# Patient Record
Sex: Male | Born: 1945 | Race: White | Hispanic: No | Smoking: Current some day smoker
Health system: Southern US, Community
[De-identification: ages and names within clinical notes are randomized; demographics above are authoritative.]

## PROBLEM LIST (undated history)

## (undated) DIAGNOSIS — I059 Rheumatic mitral valve disease, unspecified: Secondary | ICD-10-CM

## (undated) DIAGNOSIS — J449 Chronic obstructive pulmonary disease, unspecified: Secondary | ICD-10-CM

## (undated) DIAGNOSIS — F329 Major depressive disorder, single episode, unspecified: Secondary | ICD-10-CM

## (undated) DIAGNOSIS — L409 Psoriasis, unspecified: Secondary | ICD-10-CM

## (undated) DIAGNOSIS — K439 Ventral hernia without obstruction or gangrene: Secondary | ICD-10-CM

## (undated) DIAGNOSIS — Z95810 Presence of automatic (implantable) cardiac defibrillator: Secondary | ICD-10-CM

## (undated) DIAGNOSIS — I442 Atrioventricular block, complete: Secondary | ICD-10-CM

## (undated) DIAGNOSIS — I5022 Chronic systolic (congestive) heart failure: Secondary | ICD-10-CM

## (undated) DIAGNOSIS — I255 Ischemic cardiomyopathy: Secondary | ICD-10-CM

## (undated) DIAGNOSIS — L219 Seborrheic dermatitis, unspecified: Secondary | ICD-10-CM

## (undated) DIAGNOSIS — F0390 Unspecified dementia without behavioral disturbance: Secondary | ICD-10-CM

## (undated) DIAGNOSIS — Z9981 Dependence on supplemental oxygen: Secondary | ICD-10-CM

## (undated) DIAGNOSIS — F32A Depression, unspecified: Secondary | ICD-10-CM

## (undated) DIAGNOSIS — I82409 Acute embolism and thrombosis of unspecified deep veins of unspecified lower extremity: Secondary | ICD-10-CM

## (undated) DIAGNOSIS — I10 Essential (primary) hypertension: Secondary | ICD-10-CM

## (undated) DIAGNOSIS — I509 Heart failure, unspecified: Secondary | ICD-10-CM

## (undated) DIAGNOSIS — Z9889 Other specified postprocedural states: Secondary | ICD-10-CM

## (undated) DIAGNOSIS — I482 Chronic atrial fibrillation, unspecified: Secondary | ICD-10-CM

## (undated) DIAGNOSIS — F1010 Alcohol abuse, uncomplicated: Secondary | ICD-10-CM

## (undated) DIAGNOSIS — K760 Fatty (change of) liver, not elsewhere classified: Secondary | ICD-10-CM

## (undated) DIAGNOSIS — J9610 Chronic respiratory failure, unspecified whether with hypoxia or hypercapnia: Secondary | ICD-10-CM

## (undated) DIAGNOSIS — N183 Chronic kidney disease, stage 3 (moderate): Secondary | ICD-10-CM

## (undated) DIAGNOSIS — F419 Anxiety disorder, unspecified: Secondary | ICD-10-CM

## (undated) DIAGNOSIS — I639 Cerebral infarction, unspecified: Secondary | ICD-10-CM

## (undated) DIAGNOSIS — F319 Bipolar disorder, unspecified: Secondary | ICD-10-CM

## (undated) DIAGNOSIS — E119 Type 2 diabetes mellitus without complications: Secondary | ICD-10-CM

## (undated) DIAGNOSIS — I2510 Atherosclerotic heart disease of native coronary artery without angina pectoris: Secondary | ICD-10-CM

## (undated) DIAGNOSIS — E7800 Pure hypercholesterolemia, unspecified: Secondary | ICD-10-CM

## (undated) DIAGNOSIS — F17200 Nicotine dependence, unspecified, uncomplicated: Secondary | ICD-10-CM

## (undated) HISTORY — DX: Cerebral infarction, unspecified: I63.9

## (undated) HISTORY — DX: Essential (primary) hypertension: I10

## (undated) HISTORY — DX: Alcohol abuse, uncomplicated: F10.10

## (undated) HISTORY — PX: INSERT / REPLACE / REMOVE PACEMAKER: SUR710

## (undated) HISTORY — DX: Rheumatic mitral valve disease, unspecified: I05.9

## (undated) HISTORY — DX: Atrioventricular block, complete: I44.2

## (undated) HISTORY — DX: Chronic systolic (congestive) heart failure: I50.22

## (undated) HISTORY — DX: Atherosclerotic heart disease of native coronary artery without angina pectoris: I25.10

## (undated) HISTORY — DX: Pure hypercholesterolemia, unspecified: E78.00

## (undated) HISTORY — DX: Other specified postprocedural states: Z98.890

## (undated) HISTORY — DX: Ischemic cardiomyopathy: I25.5

## (undated) HISTORY — DX: Presence of automatic (implantable) cardiac defibrillator: Z95.810

## (undated) HISTORY — DX: Seborrheic dermatitis, unspecified: L21.9

## (undated) HISTORY — DX: Heart failure, unspecified: I50.9

## (undated) HISTORY — DX: Chronic kidney disease, stage 3 (moderate): N18.3

## (undated) HISTORY — DX: Depression, unspecified: F32.A

## (undated) HISTORY — DX: Fatty (change of) liver, not elsewhere classified: K76.0

## (undated) HISTORY — DX: Nicotine dependence, unspecified, uncomplicated: F17.200

## (undated) HISTORY — PX: MITRAL VALVE REPLACEMENT: SHX147

## (undated) HISTORY — DX: Bipolar disorder, unspecified: F31.9

## (undated) HISTORY — DX: Chronic obstructive pulmonary disease, unspecified: J44.9

## (undated) HISTORY — DX: Anxiety disorder, unspecified: F41.9

## (undated) HISTORY — DX: Ventral hernia without obstruction or gangrene: K43.9

## (undated) HISTORY — PX: HERNIA REPAIR: SHX51

## (undated) HISTORY — DX: Type 2 diabetes mellitus without complications: E11.9

## (undated) HISTORY — PX: EXPLORATORY LAPAROTOMY: SUR591

## (undated) HISTORY — DX: Major depressive disorder, single episode, unspecified: F32.9

## (undated) HISTORY — PX: VASECTOMY: SHX75

## (undated) HISTORY — PX: OTHER SURGICAL HISTORY: SHX169

---

## 1997-09-07 ENCOUNTER — Encounter (HOSPITAL_COMMUNITY): Admission: RE | Admit: 1997-09-07 | Discharge: 1997-12-06 | Payer: Self-pay | Admitting: Psychiatry

## 1997-09-11 ENCOUNTER — Encounter: Admission: RE | Admit: 1997-09-11 | Discharge: 1997-09-11 | Payer: Self-pay | Admitting: Sports Medicine

## 1997-09-17 ENCOUNTER — Encounter: Admission: RE | Admit: 1997-09-17 | Discharge: 1997-09-17 | Payer: Self-pay | Admitting: Family Medicine

## 1997-10-01 ENCOUNTER — Ambulatory Visit (HOSPITAL_COMMUNITY): Admission: RE | Admit: 1997-10-01 | Discharge: 1997-10-01 | Payer: Self-pay | Admitting: General Surgery

## 1997-10-02 ENCOUNTER — Encounter: Admission: RE | Admit: 1997-10-02 | Discharge: 1997-10-02 | Payer: Self-pay | Admitting: Family Medicine

## 1997-10-11 ENCOUNTER — Inpatient Hospital Stay (HOSPITAL_COMMUNITY): Admission: RE | Admit: 1997-10-11 | Discharge: 1997-10-16 | Payer: Self-pay | Admitting: General Surgery

## 1997-11-06 ENCOUNTER — Encounter: Admission: RE | Admit: 1997-11-06 | Discharge: 1997-11-06 | Payer: Self-pay | Admitting: Family Medicine

## 1997-11-13 ENCOUNTER — Encounter: Admission: RE | Admit: 1997-11-13 | Discharge: 1997-11-13 | Payer: Self-pay | Admitting: Family Medicine

## 1997-11-20 ENCOUNTER — Encounter: Admission: RE | Admit: 1997-11-20 | Discharge: 1997-11-20 | Payer: Self-pay | Admitting: Sports Medicine

## 1997-11-27 ENCOUNTER — Encounter: Admission: RE | Admit: 1997-11-27 | Discharge: 1997-11-27 | Payer: Self-pay | Admitting: Family Medicine

## 1998-06-17 ENCOUNTER — Encounter: Admission: RE | Admit: 1998-06-17 | Discharge: 1998-06-17 | Payer: Self-pay | Admitting: Family Medicine

## 1998-06-27 ENCOUNTER — Encounter: Admission: RE | Admit: 1998-06-27 | Discharge: 1998-06-27 | Payer: Self-pay | Admitting: Family Medicine

## 1998-07-04 ENCOUNTER — Encounter: Admission: RE | Admit: 1998-07-04 | Discharge: 1998-07-04 | Payer: Self-pay | Admitting: Family Medicine

## 1998-10-03 ENCOUNTER — Inpatient Hospital Stay (HOSPITAL_COMMUNITY): Admission: EM | Admit: 1998-10-03 | Discharge: 1998-10-04 | Payer: Self-pay | Admitting: Emergency Medicine

## 1998-10-03 ENCOUNTER — Encounter: Payer: Self-pay | Admitting: Cardiothoracic Surgery

## 1998-10-03 ENCOUNTER — Encounter: Payer: Self-pay | Admitting: Emergency Medicine

## 1998-10-04 ENCOUNTER — Encounter: Payer: Self-pay | Admitting: Cardiothoracic Surgery

## 1998-12-02 ENCOUNTER — Emergency Department (HOSPITAL_COMMUNITY): Admission: EM | Admit: 1998-12-02 | Discharge: 1998-12-02 | Payer: Self-pay | Admitting: Emergency Medicine

## 1999-02-02 ENCOUNTER — Inpatient Hospital Stay (HOSPITAL_COMMUNITY): Admission: EM | Admit: 1999-02-02 | Discharge: 1999-02-05 | Payer: Self-pay | Admitting: Emergency Medicine

## 1999-02-02 ENCOUNTER — Encounter: Payer: Self-pay | Admitting: Emergency Medicine

## 1999-09-23 ENCOUNTER — Ambulatory Visit (HOSPITAL_COMMUNITY): Admission: RE | Admit: 1999-09-23 | Discharge: 1999-09-23 | Payer: Self-pay | Admitting: Neurology

## 1999-09-23 ENCOUNTER — Encounter: Payer: Self-pay | Admitting: Neurology

## 2000-06-05 ENCOUNTER — Inpatient Hospital Stay (HOSPITAL_COMMUNITY): Admission: EM | Admit: 2000-06-05 | Discharge: 2000-06-10 | Payer: Self-pay | Admitting: Emergency Medicine

## 2000-06-05 ENCOUNTER — Encounter: Payer: Self-pay | Admitting: Cardiology

## 2000-08-17 ENCOUNTER — Emergency Department (HOSPITAL_COMMUNITY): Admission: EM | Admit: 2000-08-17 | Discharge: 2000-08-17 | Payer: Self-pay | Admitting: Emergency Medicine

## 2000-08-17 ENCOUNTER — Encounter: Payer: Self-pay | Admitting: Emergency Medicine

## 2000-08-18 ENCOUNTER — Encounter: Payer: Self-pay | Admitting: Emergency Medicine

## 2000-08-19 ENCOUNTER — Ambulatory Visit (HOSPITAL_COMMUNITY): Admission: RE | Admit: 2000-08-19 | Discharge: 2000-08-19 | Payer: Self-pay | Admitting: Emergency Medicine

## 2000-10-05 ENCOUNTER — Emergency Department (HOSPITAL_COMMUNITY): Admission: EM | Admit: 2000-10-05 | Discharge: 2000-10-05 | Payer: Self-pay | Admitting: *Deleted

## 2000-10-05 ENCOUNTER — Encounter: Payer: Self-pay | Admitting: Emergency Medicine

## 2000-10-18 ENCOUNTER — Emergency Department (HOSPITAL_COMMUNITY): Admission: EM | Admit: 2000-10-18 | Discharge: 2000-10-18 | Payer: Self-pay | Admitting: Emergency Medicine

## 2000-11-02 ENCOUNTER — Encounter: Payer: Self-pay | Admitting: Gastroenterology

## 2000-11-02 ENCOUNTER — Ambulatory Visit (HOSPITAL_COMMUNITY): Admission: RE | Admit: 2000-11-02 | Discharge: 2000-11-02 | Payer: Self-pay | Admitting: Gastroenterology

## 2000-11-05 ENCOUNTER — Ambulatory Visit (HOSPITAL_COMMUNITY): Admission: RE | Admit: 2000-11-05 | Discharge: 2000-11-05 | Payer: Self-pay | Admitting: Gastroenterology

## 2000-11-05 ENCOUNTER — Encounter: Payer: Self-pay | Admitting: Gastroenterology

## 2001-01-15 ENCOUNTER — Encounter: Payer: Self-pay | Admitting: Emergency Medicine

## 2001-01-15 ENCOUNTER — Inpatient Hospital Stay (HOSPITAL_COMMUNITY): Admission: EM | Admit: 2001-01-15 | Discharge: 2001-01-17 | Payer: Self-pay | Admitting: Emergency Medicine

## 2001-01-16 ENCOUNTER — Encounter: Payer: Self-pay | Admitting: Cardiology

## 2001-03-01 ENCOUNTER — Emergency Department (HOSPITAL_COMMUNITY): Admission: EM | Admit: 2001-03-01 | Discharge: 2001-03-01 | Payer: Self-pay

## 2001-03-12 ENCOUNTER — Inpatient Hospital Stay (HOSPITAL_COMMUNITY): Admission: EM | Admit: 2001-03-12 | Discharge: 2001-03-19 | Payer: Self-pay

## 2001-06-22 ENCOUNTER — Encounter (INDEPENDENT_AMBULATORY_CARE_PROVIDER_SITE_OTHER): Payer: Self-pay | Admitting: Specialist

## 2001-06-22 ENCOUNTER — Encounter (INDEPENDENT_AMBULATORY_CARE_PROVIDER_SITE_OTHER): Payer: Self-pay | Admitting: *Deleted

## 2001-06-22 ENCOUNTER — Emergency Department (HOSPITAL_COMMUNITY): Admission: EM | Admit: 2001-06-22 | Discharge: 2001-06-22 | Payer: Self-pay | Admitting: Emergency Medicine

## 2001-06-22 ENCOUNTER — Encounter: Payer: Self-pay | Admitting: Emergency Medicine

## 2001-06-22 ENCOUNTER — Inpatient Hospital Stay (HOSPITAL_COMMUNITY): Admission: EM | Admit: 2001-06-22 | Discharge: 2001-07-03 | Payer: Self-pay | Admitting: Family Medicine

## 2001-06-22 ENCOUNTER — Encounter: Payer: Self-pay | Admitting: Family Medicine

## 2001-06-24 ENCOUNTER — Encounter: Payer: Self-pay | Admitting: Family Medicine

## 2001-06-25 ENCOUNTER — Encounter: Payer: Self-pay | Admitting: Family Medicine

## 2001-06-27 ENCOUNTER — Encounter: Payer: Self-pay | Admitting: Family Medicine

## 2001-06-28 ENCOUNTER — Encounter: Payer: Self-pay | Admitting: Family Medicine

## 2001-06-29 ENCOUNTER — Encounter: Payer: Self-pay | Admitting: Family Medicine

## 2001-07-01 ENCOUNTER — Encounter: Payer: Self-pay | Admitting: Family Medicine

## 2001-09-14 ENCOUNTER — Encounter: Payer: Self-pay | Admitting: Family Medicine

## 2001-09-14 ENCOUNTER — Ambulatory Visit (HOSPITAL_COMMUNITY): Admission: RE | Admit: 2001-09-14 | Discharge: 2001-09-14 | Payer: Self-pay | Admitting: Family Medicine

## 2001-09-28 ENCOUNTER — Inpatient Hospital Stay (HOSPITAL_COMMUNITY): Admission: EM | Admit: 2001-09-28 | Discharge: 2001-10-02 | Payer: Self-pay

## 2001-09-29 ENCOUNTER — Encounter: Payer: Self-pay | Admitting: Cardiology

## 2001-12-08 ENCOUNTER — Encounter: Payer: Self-pay | Admitting: Cardiology

## 2001-12-08 ENCOUNTER — Observation Stay (HOSPITAL_COMMUNITY): Admission: EM | Admit: 2001-12-08 | Discharge: 2001-12-09 | Payer: Self-pay

## 2002-03-30 ENCOUNTER — Encounter: Payer: Self-pay | Admitting: Cardiovascular Disease

## 2002-03-30 ENCOUNTER — Inpatient Hospital Stay (HOSPITAL_COMMUNITY): Admission: EM | Admit: 2002-03-30 | Discharge: 2002-04-04 | Payer: Self-pay | Admitting: Emergency Medicine

## 2002-05-28 ENCOUNTER — Encounter: Payer: Self-pay | Admitting: *Deleted

## 2002-05-28 ENCOUNTER — Emergency Department (HOSPITAL_COMMUNITY): Admission: EM | Admit: 2002-05-28 | Discharge: 2002-05-28 | Payer: Self-pay | Admitting: *Deleted

## 2002-07-04 ENCOUNTER — Inpatient Hospital Stay (HOSPITAL_COMMUNITY): Admission: EM | Admit: 2002-07-04 | Discharge: 2002-07-15 | Payer: Self-pay | Admitting: Emergency Medicine

## 2002-07-04 ENCOUNTER — Encounter: Payer: Self-pay | Admitting: Emergency Medicine

## 2002-07-06 ENCOUNTER — Encounter: Payer: Self-pay | Admitting: Vascular Surgery

## 2002-07-06 ENCOUNTER — Encounter (INDEPENDENT_AMBULATORY_CARE_PROVIDER_SITE_OTHER): Payer: Self-pay | Admitting: *Deleted

## 2002-07-07 ENCOUNTER — Encounter: Payer: Self-pay | Admitting: Cardiology

## 2002-07-08 ENCOUNTER — Encounter: Payer: Self-pay | Admitting: Cardiology

## 2002-07-10 ENCOUNTER — Encounter: Payer: Self-pay | Admitting: Cardiovascular Disease

## 2002-12-05 ENCOUNTER — Emergency Department (HOSPITAL_COMMUNITY): Admission: EM | Admit: 2002-12-05 | Discharge: 2002-12-05 | Payer: Self-pay | Admitting: Emergency Medicine

## 2002-12-05 ENCOUNTER — Encounter: Payer: Self-pay | Admitting: Emergency Medicine

## 2002-12-08 ENCOUNTER — Emergency Department (HOSPITAL_COMMUNITY): Admission: EM | Admit: 2002-12-08 | Discharge: 2002-12-08 | Payer: Self-pay

## 2002-12-08 ENCOUNTER — Encounter: Payer: Self-pay | Admitting: *Deleted

## 2003-01-23 ENCOUNTER — Inpatient Hospital Stay (HOSPITAL_COMMUNITY): Admission: AD | Admit: 2003-01-23 | Discharge: 2003-01-26 | Payer: Self-pay | Admitting: Internal Medicine

## 2003-04-01 ENCOUNTER — Inpatient Hospital Stay (HOSPITAL_COMMUNITY): Admission: AD | Admit: 2003-04-01 | Discharge: 2003-04-04 | Payer: Self-pay | Admitting: Psychiatry

## 2003-04-13 ENCOUNTER — Encounter: Admission: RE | Admit: 2003-04-13 | Discharge: 2003-04-13 | Payer: Self-pay | Admitting: Psychiatry

## 2003-06-29 ENCOUNTER — Ambulatory Visit (HOSPITAL_COMMUNITY): Admission: RE | Admit: 2003-06-29 | Discharge: 2003-06-29 | Payer: Self-pay | Admitting: Internal Medicine

## 2003-07-02 ENCOUNTER — Inpatient Hospital Stay (HOSPITAL_COMMUNITY): Admission: EM | Admit: 2003-07-02 | Discharge: 2003-07-05 | Payer: Self-pay | Admitting: Emergency Medicine

## 2003-07-17 ENCOUNTER — Emergency Department (HOSPITAL_COMMUNITY): Admission: EM | Admit: 2003-07-17 | Discharge: 2003-07-17 | Payer: Self-pay | Admitting: Emergency Medicine

## 2003-08-30 ENCOUNTER — Observation Stay (HOSPITAL_COMMUNITY): Admission: EM | Admit: 2003-08-30 | Discharge: 2003-08-31 | Payer: Self-pay | Admitting: Emergency Medicine

## 2003-09-15 IMAGING — CT CT HEAD W/O CM
1 series · 15 of 28 positions shown, 19 images · non-contrast
Comparison: 05/28/02.

FINDINGS
CLINICAL DATA: HEADACHE RADIATING TO THE RIGHT NECK AND SHOULDER.  NO KNOWN INJURY.
CT SCAN OF THE HEAD WITHOUT CONTRAST

[Series 2: brain · axial · 0.47mm/px · z∈[+113,+236]mm · 15 of 28 slices shown, 19 images]
[im 2/28  brain]
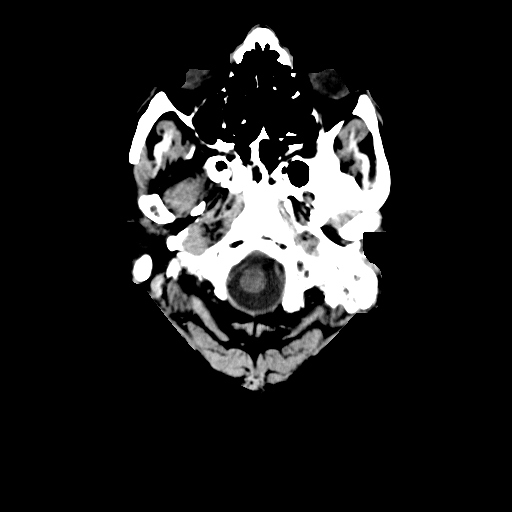
[im 2/28  bone]
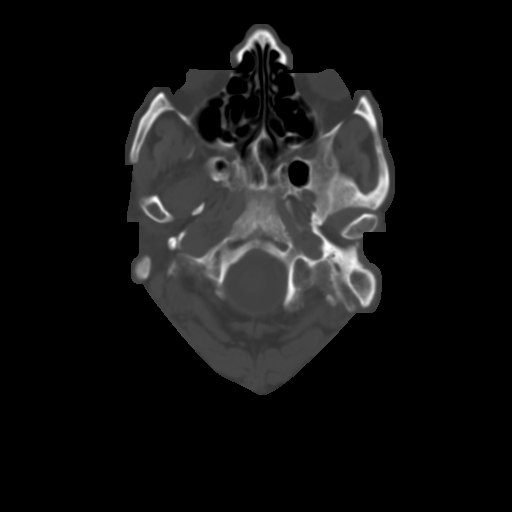
[im 4/28  brain]
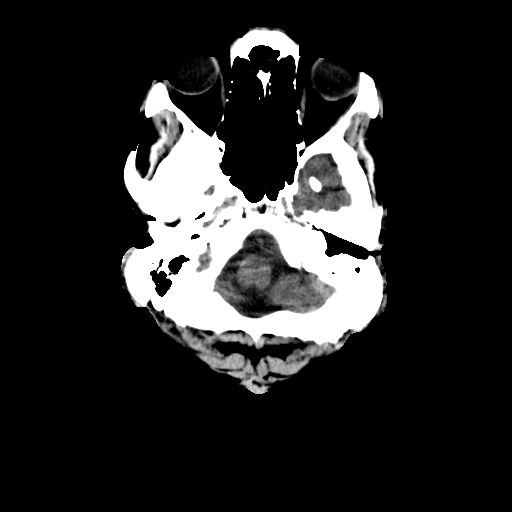
[im 6/28  brain]
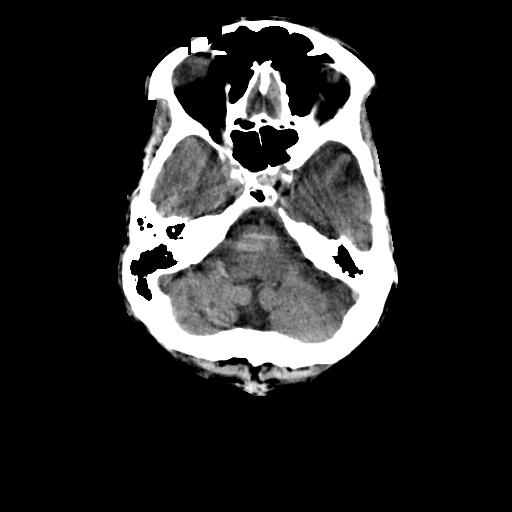
[im 8/28  brain]
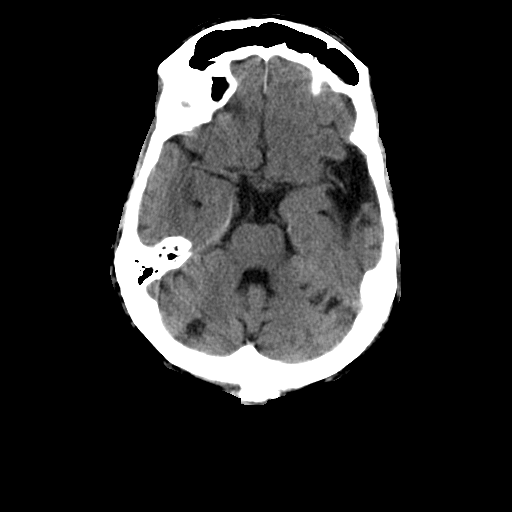
[im 9/28  brain]
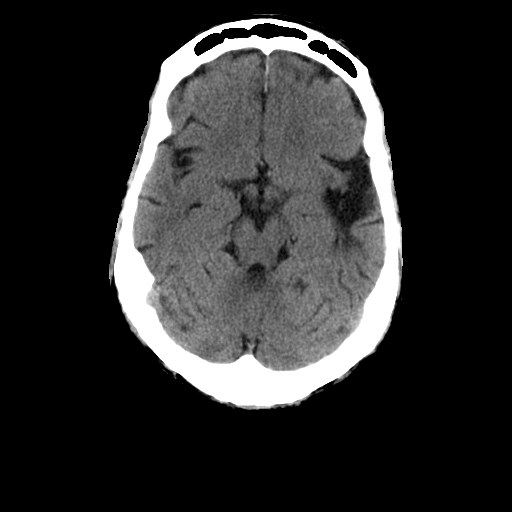
[im 9/28  bone]
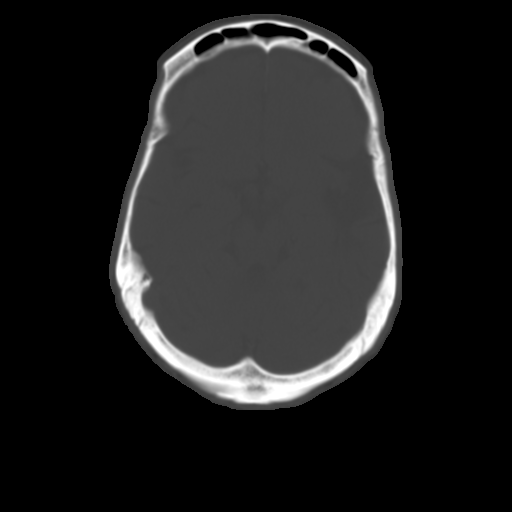
[im 11/28  brain]
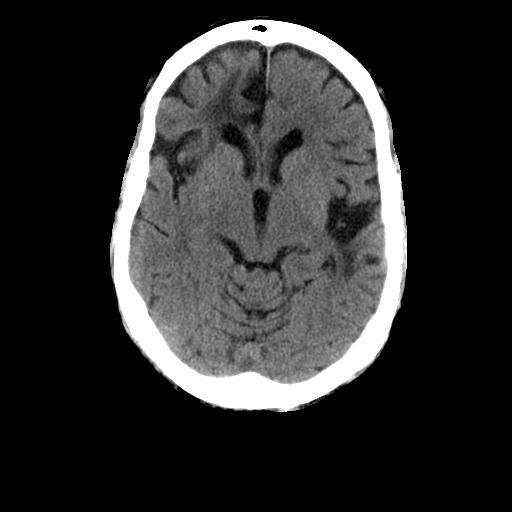
[im 13/28  brain]
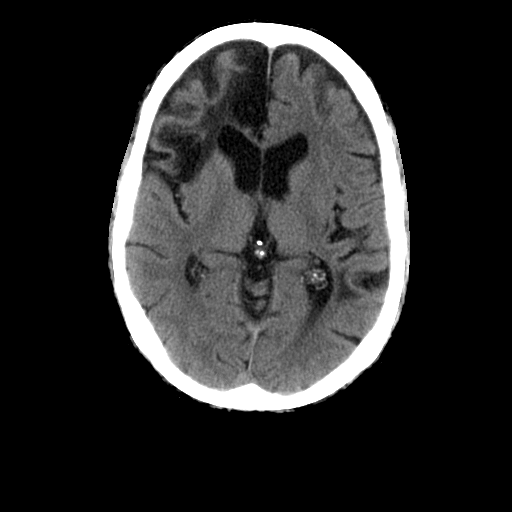
[im 15/28  brain]
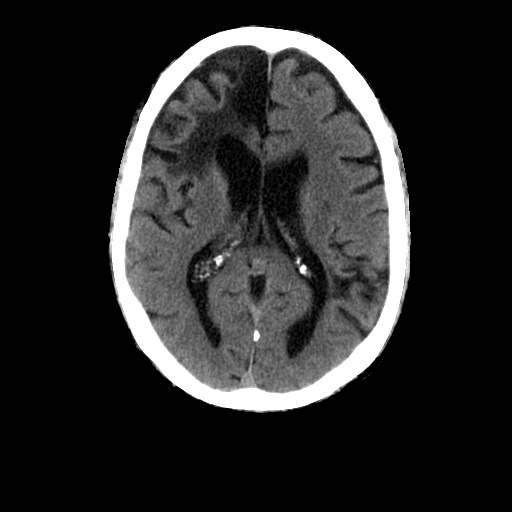
[im 16/28  brain]
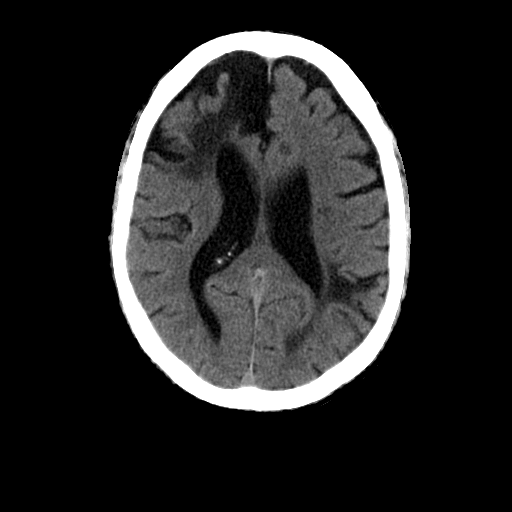
[im 16/28  bone]
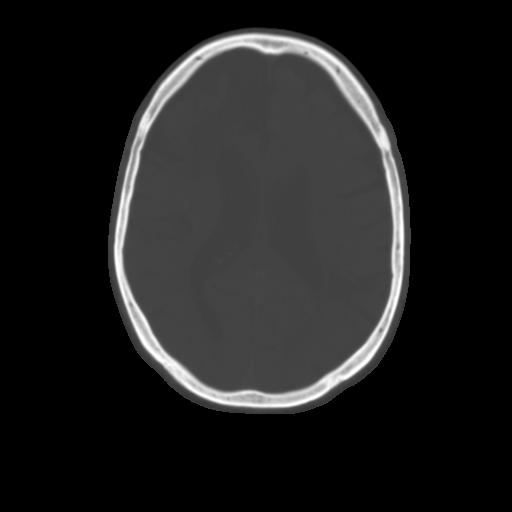
[im 18/28  brain]
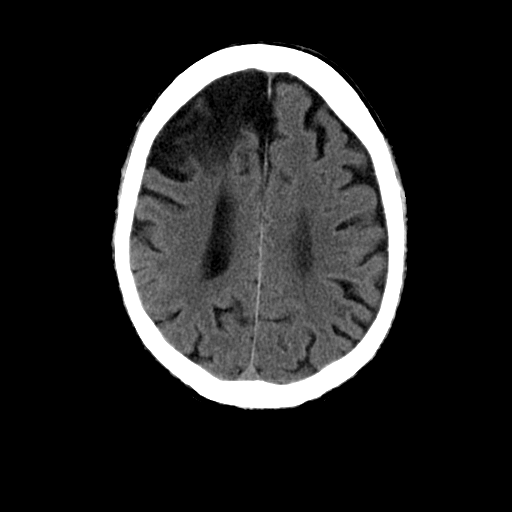
[im 20/28  brain]
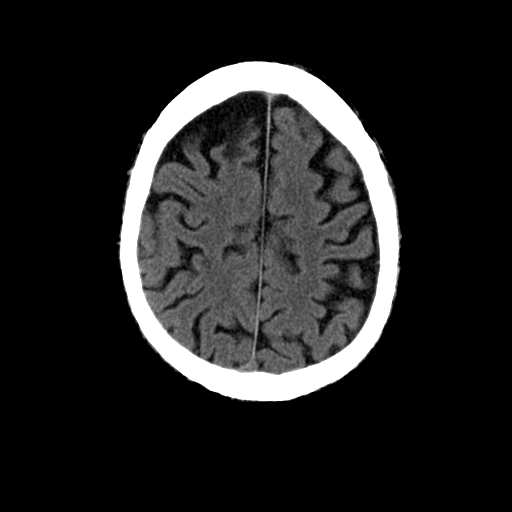
[im 21/28  brain]
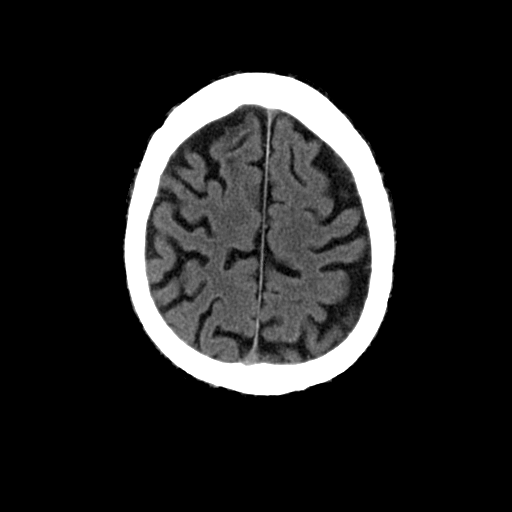
[im 23/28  brain]
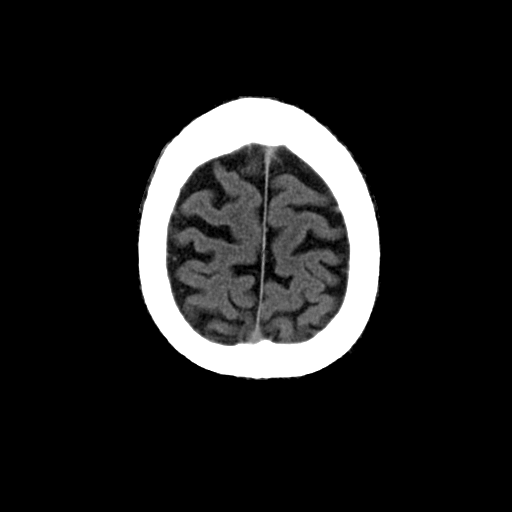
[im 23/28  bone]
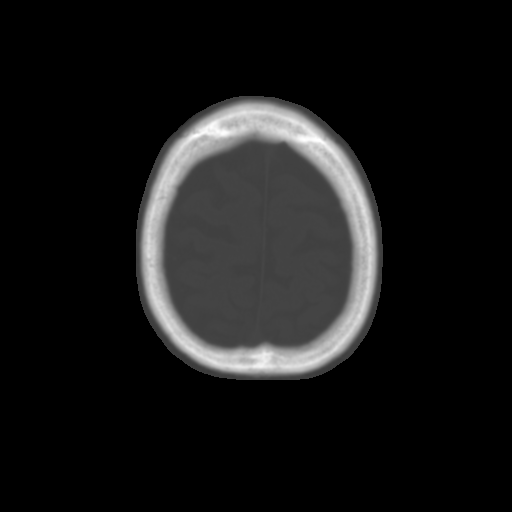
[im 25/28  brain]
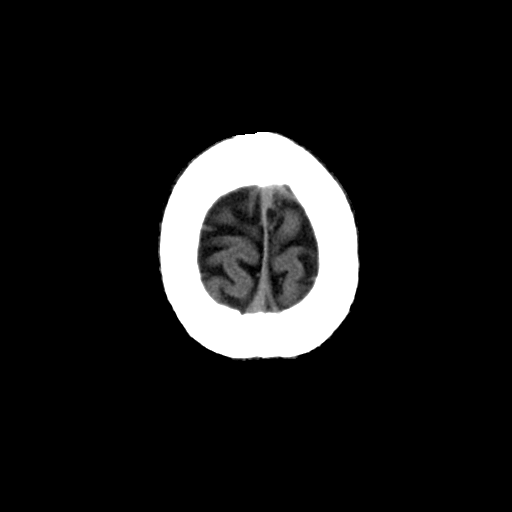
[im 27/28  brain]
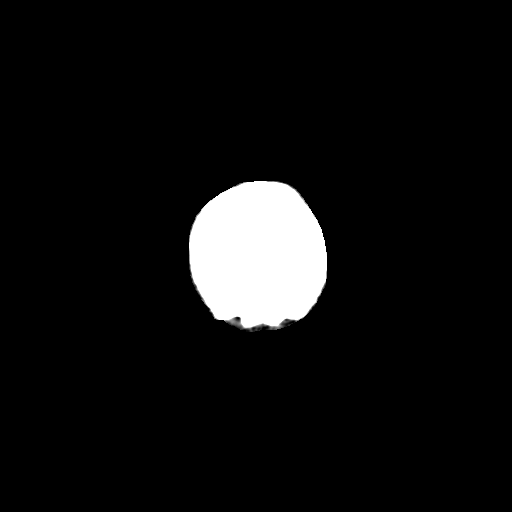

[15 of 28 positions shown; findings below may reference images not displayed]

ROUTINE UNENHANCED STUDY WAS PERFORMED.
THERE IS NO EVIDENCE OF ACUTE INTRACRANIAL HEMORRHAGE, MASS EFFECT, OR EXTRA-AXIAL FLUID
COLLECTION.  THERE IS EXTENSIVE CHRONIC ISCHEMIC CHANGE WITH OLD RIGHT FRONTAL, LEFT TEMPORAL, LEFT
PARIETAL, AND RIGHT CEREBELLAR STROKES WHICH APPEAR STABLE.  THERE IS NO CT EVIDENCE OF ACUTE
CORTICAL BASED STROKE OR HYDROCEPHALUS.  THERE IS SLIGHT ASYMMETRY OF THE NASOPHARYNGEAL SOFT
TISSUES WITH FULLNESS ON THE LEFT ON IMAGE ONE.  THIS AREA WAS NOT PREVIOUSLY IMAGED.  THE MASTOID
AIR CELLS ON THE LEFT ARE UNDER PNEUMATIZED AND HAVE A SIMILAR APPEARANCE TO PRIOR STUDIES.  THE
VISUALIZED PARANASAL SINUSES ARE CLEAR.
IMPRESSION
1.  STABLE MULTIFOCAL CHRONIC ISCHEMIC CHANGES AS DESCRIBED.  NO ACUTE INTRACRANIAL FINDINGS
DEMONSTRATED.
2.  ASYMMETRIC FULLNESS OF THE NASOPHARYNGEAL SOFT TISSUES ON THE LEFT.  CORRELATION WITH DIRECT
VISUALIZATION IS RECOMMENDED TO EXCLUDE THE POSSIBILITY OF NEOPLASM.

## 2003-10-15 ENCOUNTER — Encounter: Admission: RE | Admit: 2003-10-15 | Discharge: 2003-10-15 | Payer: Self-pay | Admitting: Internal Medicine

## 2003-12-08 ENCOUNTER — Emergency Department (HOSPITAL_COMMUNITY): Admission: EM | Admit: 2003-12-08 | Discharge: 2003-12-09 | Payer: Self-pay | Admitting: Emergency Medicine

## 2004-01-13 ENCOUNTER — Inpatient Hospital Stay (HOSPITAL_COMMUNITY): Admission: EM | Admit: 2004-01-13 | Discharge: 2004-01-18 | Payer: Self-pay

## 2004-04-18 ENCOUNTER — Encounter: Admission: RE | Admit: 2004-04-18 | Discharge: 2004-04-18 | Payer: Self-pay | Admitting: Cardiology

## 2004-05-22 ENCOUNTER — Ambulatory Visit (HOSPITAL_COMMUNITY): Admission: RE | Admit: 2004-05-22 | Discharge: 2004-05-22 | Payer: Self-pay | Admitting: Cardiology

## 2004-06-06 IMAGING — CR DG CHEST 1V PORT
1 series · 1 of 1 positions shown · non-contrast
Comparison: Portable exam 07/02/03.

CLINICAL DATA: Chest pain.  Shortness of breath. 
 PORTABLE CHEST ONE VIEW ([DATE] HOURS)

[view not recorded]
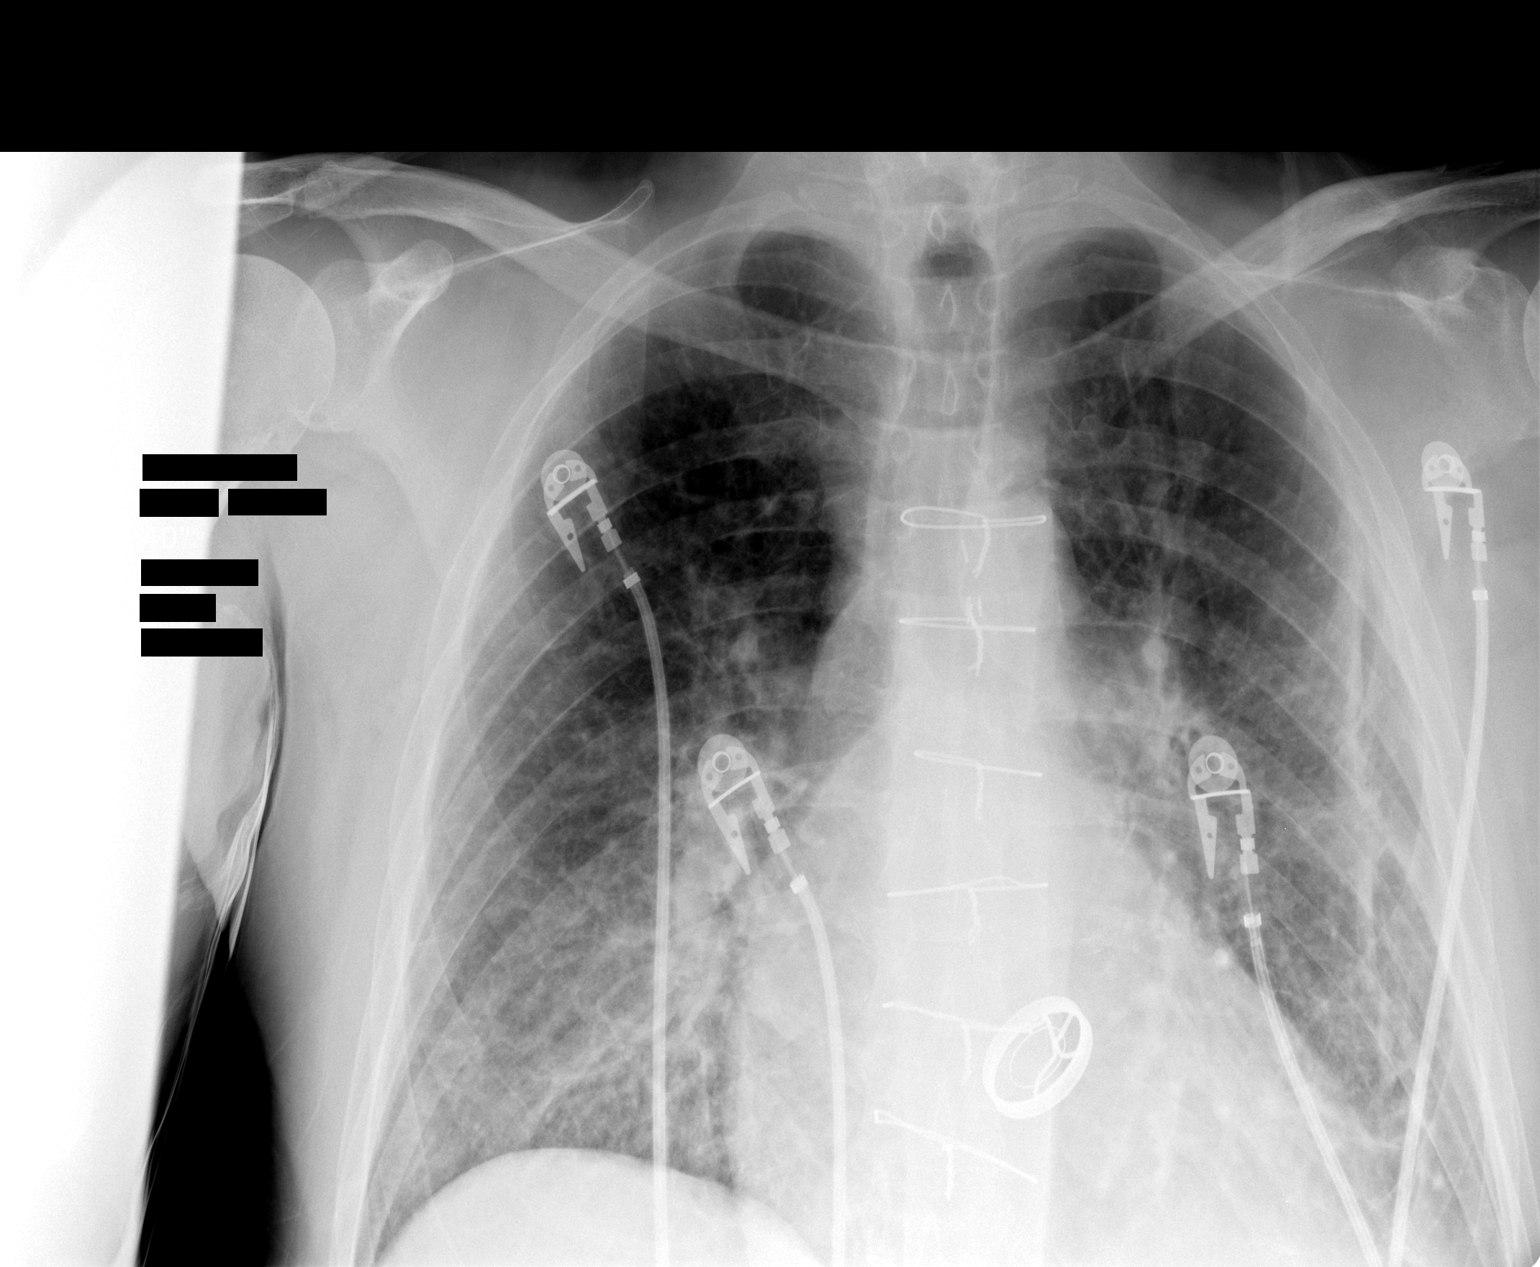

[1 of 1 positions shown; findings below may reference images not displayed]

The patient has undergone previous sternotomy for aortic valve replacement.  The heart is enlarged though stable.  Diffuse interstitial pulmonary edema is present. 
 IMPRESSION
 CHF.

## 2004-06-07 ENCOUNTER — Emergency Department (HOSPITAL_COMMUNITY): Admission: EM | Admit: 2004-06-07 | Discharge: 2004-06-08 | Payer: Self-pay | Admitting: Emergency Medicine

## 2004-06-08 HISTORY — PX: PACEMAKER PLACEMENT: SHX43

## 2004-07-08 ENCOUNTER — Inpatient Hospital Stay (HOSPITAL_COMMUNITY): Admission: EM | Admit: 2004-07-08 | Discharge: 2004-07-13 | Payer: Self-pay

## 2004-07-09 ENCOUNTER — Encounter (INDEPENDENT_AMBULATORY_CARE_PROVIDER_SITE_OTHER): Payer: Self-pay | Admitting: Cardiology

## 2004-07-11 ENCOUNTER — Encounter (INDEPENDENT_AMBULATORY_CARE_PROVIDER_SITE_OTHER): Payer: Self-pay | Admitting: *Deleted

## 2004-07-22 IMAGING — US US ABDOMEN COMPLETE
1 series · 14 of 25 positions shown · non-contrast
Comparison: none

CLINICAL DATA: Abdominal pain particularly right upper quadrant. 
COMPLETE ABDOMINAL ULTRASOUND: 
Scans over the upper abdomen were performed.  The gallbladder is well seen and no gallstones are noted.  The liver has a normal echogenic pattern although the left lobe is not well seen.  Common bile duct is normal measuring 4.3mm.  The intrahepatic IVC is not well seen due to bowel gas and the pancreas is largely obscured by bowel gas as well.  The spleen appears normal.  No hydronephrosis is seen.  The right kidney measures 10.5cm sagittally with the left kidney measuring 10.2cm.  The parenchyma is somewhat thinned bilaterally.  The mid portion of the abdominal aorta is not well seen but the distal portion is normal in caliber.

[Series 1: unknown · 0.34mm/px · 14 of 65 slices shown]
[im 1/65]
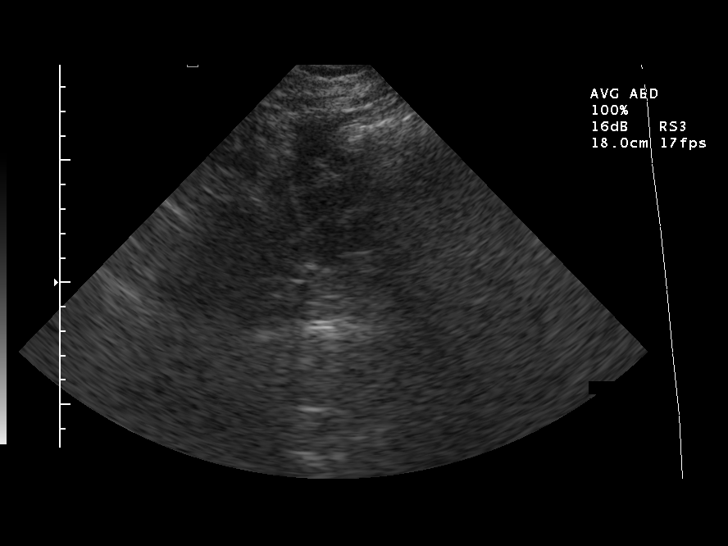
[im 6/65]
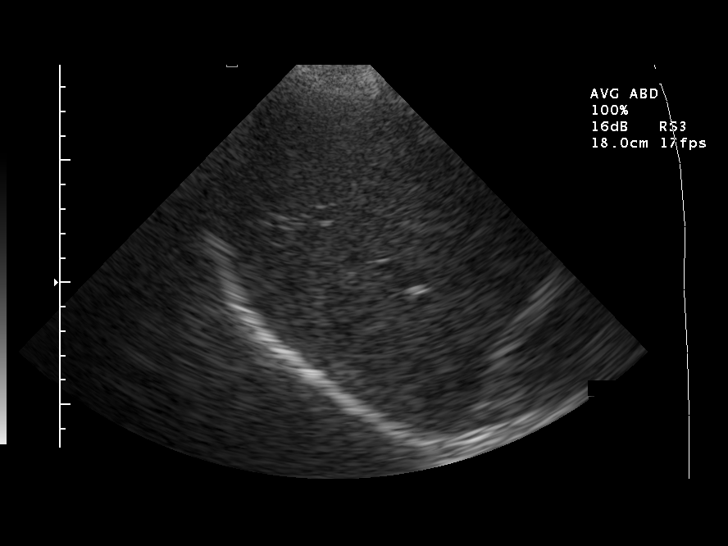
[im 11/65]
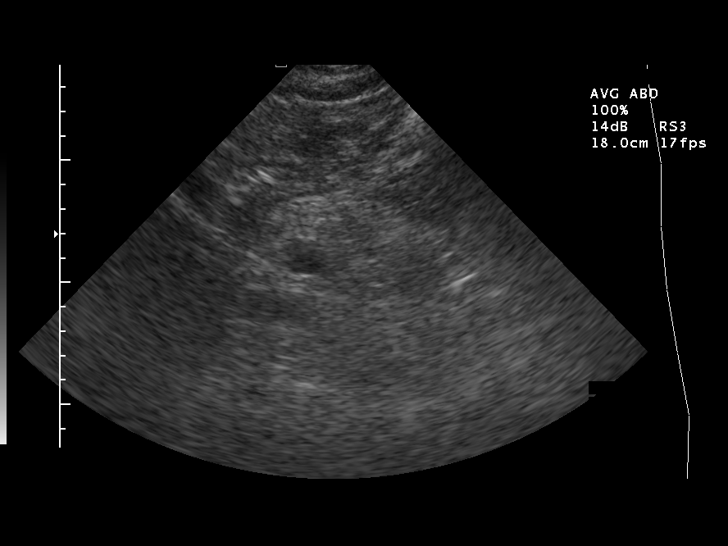
[im 17/65]
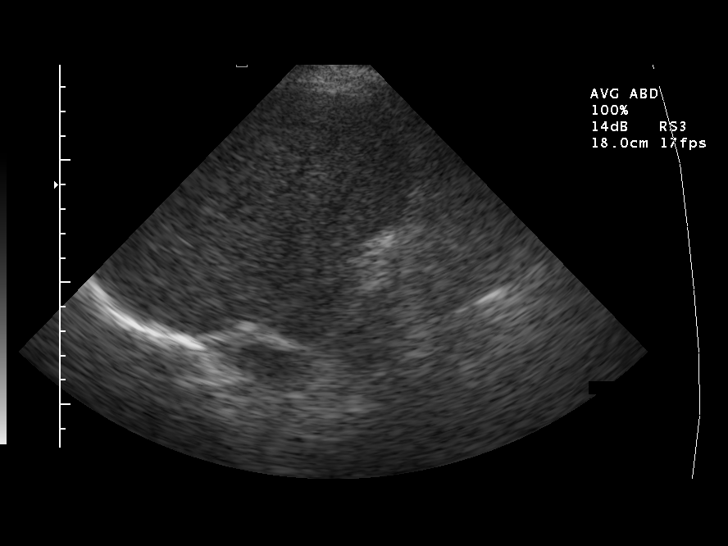
[im 22/65]
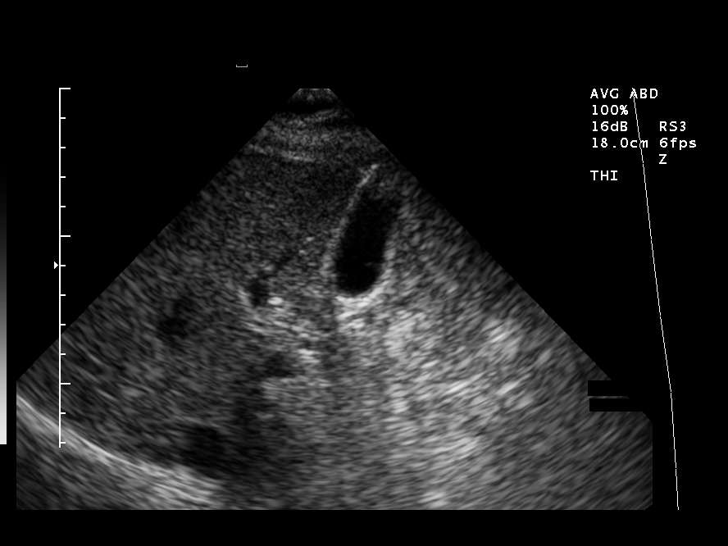
[im 25/65]
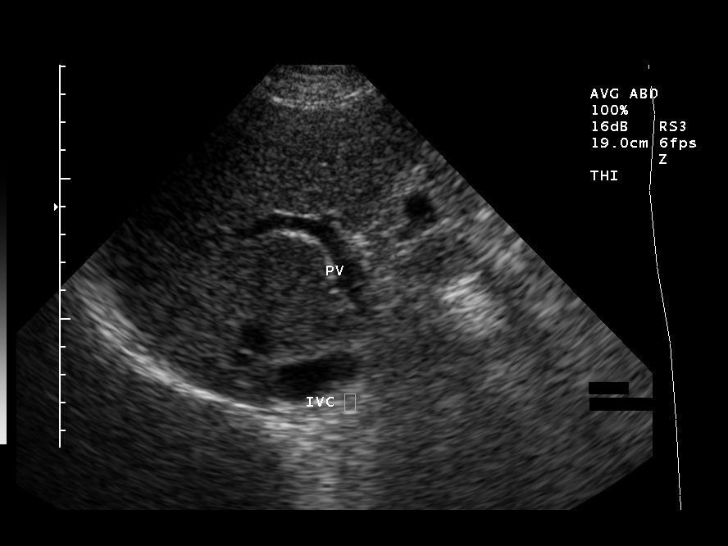
[im 30/65]
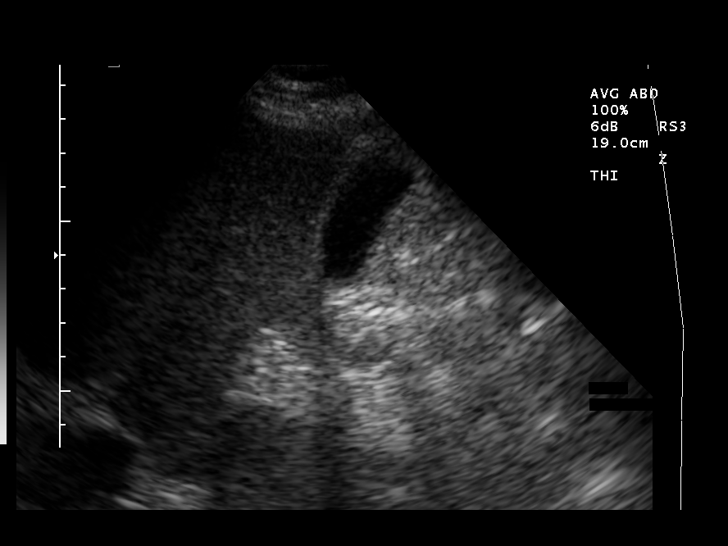
[im 35/65]
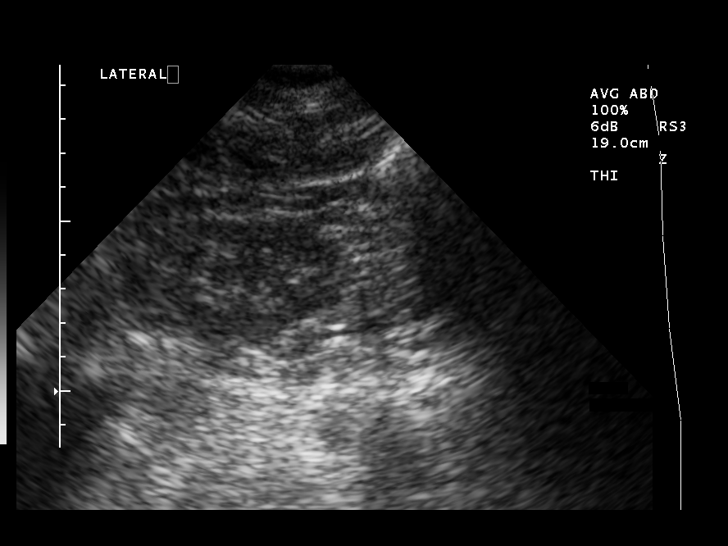
[im 41/65]
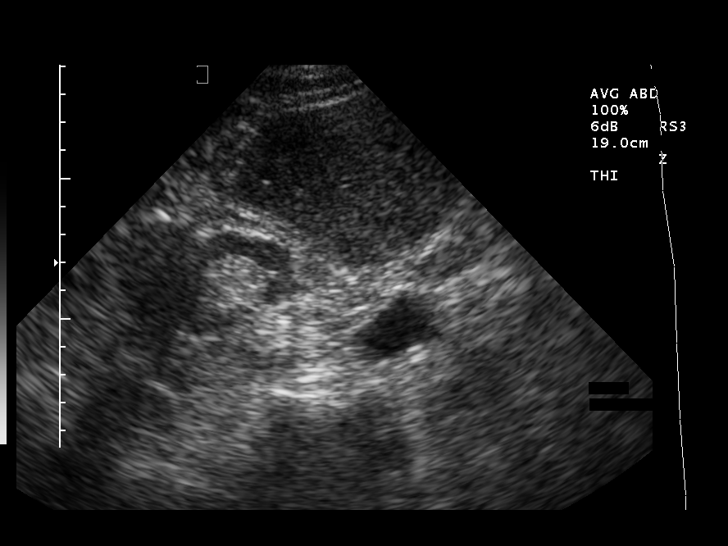
[im 43/65]
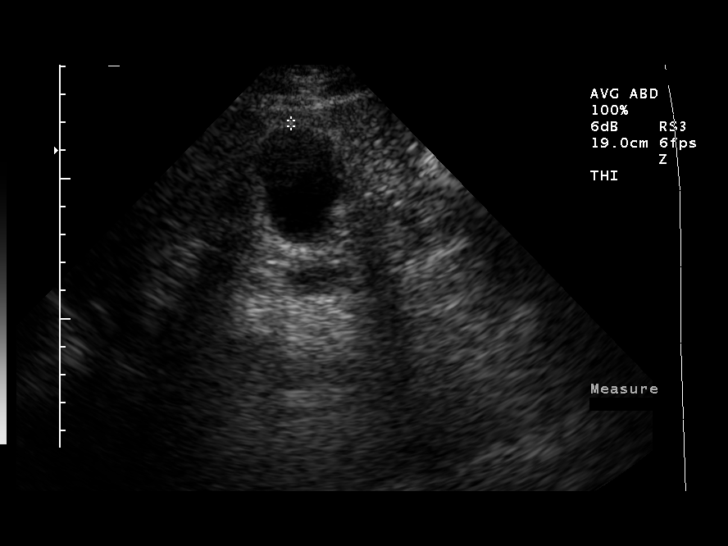
[im 49/65]
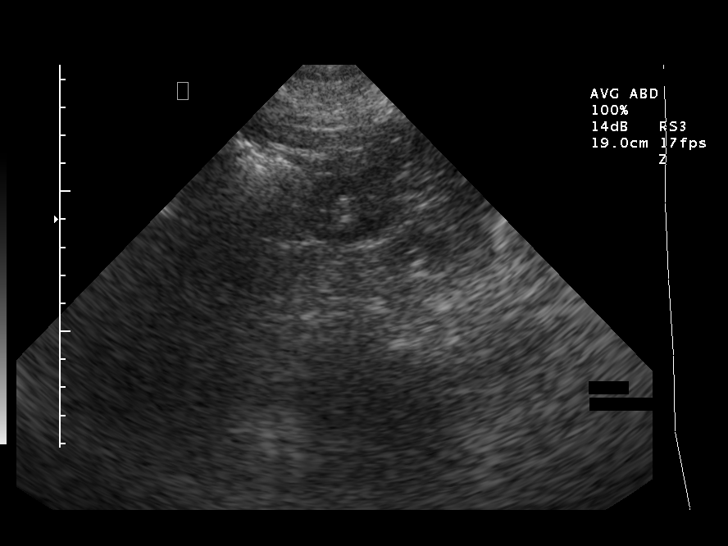
[im 54/65]
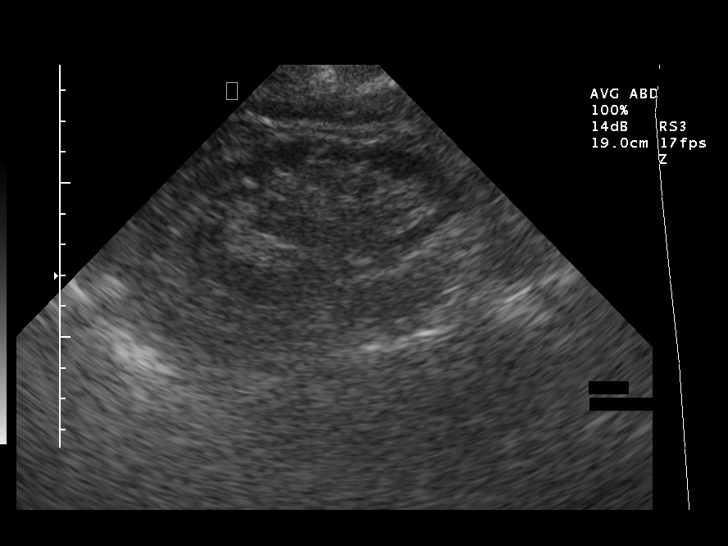
[im 59/65]
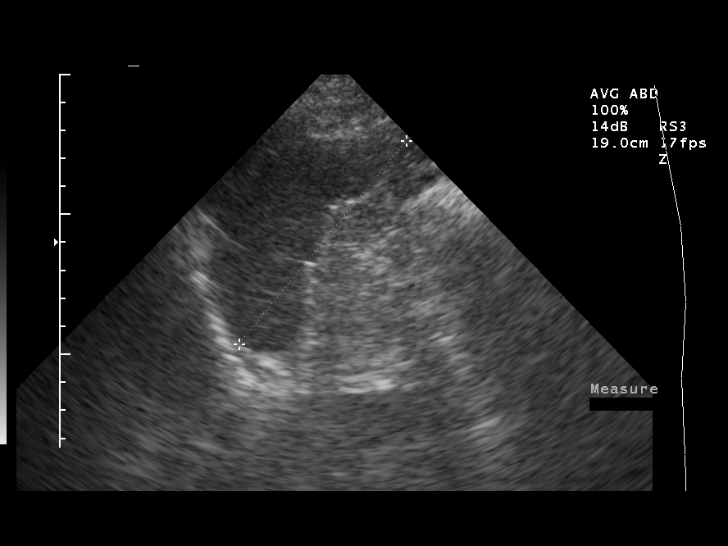
[im 65/65]
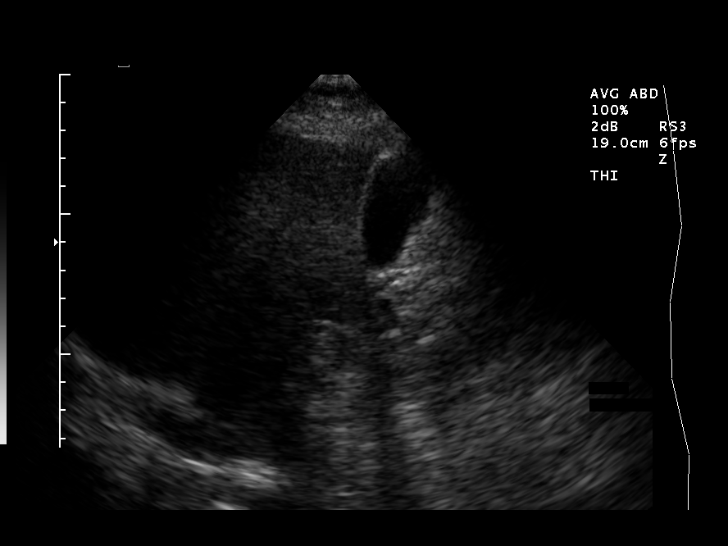

[14 of 25 positions shown; findings below may reference images not displayed]

IMPRESSION: 1.  No gallstones.  
2.  Portions of the study are compromised by bowel gas as noted above. 
3.    No renal mass or hydronephrosis is noted.   Questionable mild thinning of the renal parenchymal cortex.

## 2004-08-07 ENCOUNTER — Ambulatory Visit: Payer: Self-pay | Admitting: Internal Medicine

## 2004-08-08 ENCOUNTER — Inpatient Hospital Stay (HOSPITAL_COMMUNITY): Admission: EM | Admit: 2004-08-08 | Discharge: 2004-08-17 | Payer: Self-pay | Admitting: Emergency Medicine

## 2004-09-14 IMAGING — CR DG CHEST 1V PORT
1 series · 1 of 1 positions shown · non-contrast
Comparison: none

CLINICAL DATA: Chest pain.  Shortness of breath.  Prior aortic valve replacement.
 PORTABLE CHEST, ONE VIEW ? 12/08/2003 ? (7785 HOURS)
 Comparison, portable chest 08/30/2003.
 Patient has undergone sternotomy for aortic valve replacement.  The heart is mildly enlarged.  Vertically oriented linear scarring is present in the left upper lobe and lingula.  The lungs appear clear otherwise.  There is no evidence of pulmonary edema as was present on the previous examination.  Calcified pleural plaque is noted along the left hemidiaphragm.
 IMPRESSION
 Stable cardiomegaly.  Scarring in the left upper lobe and lingula.  No evidence of acute disease.

[view not recorded]
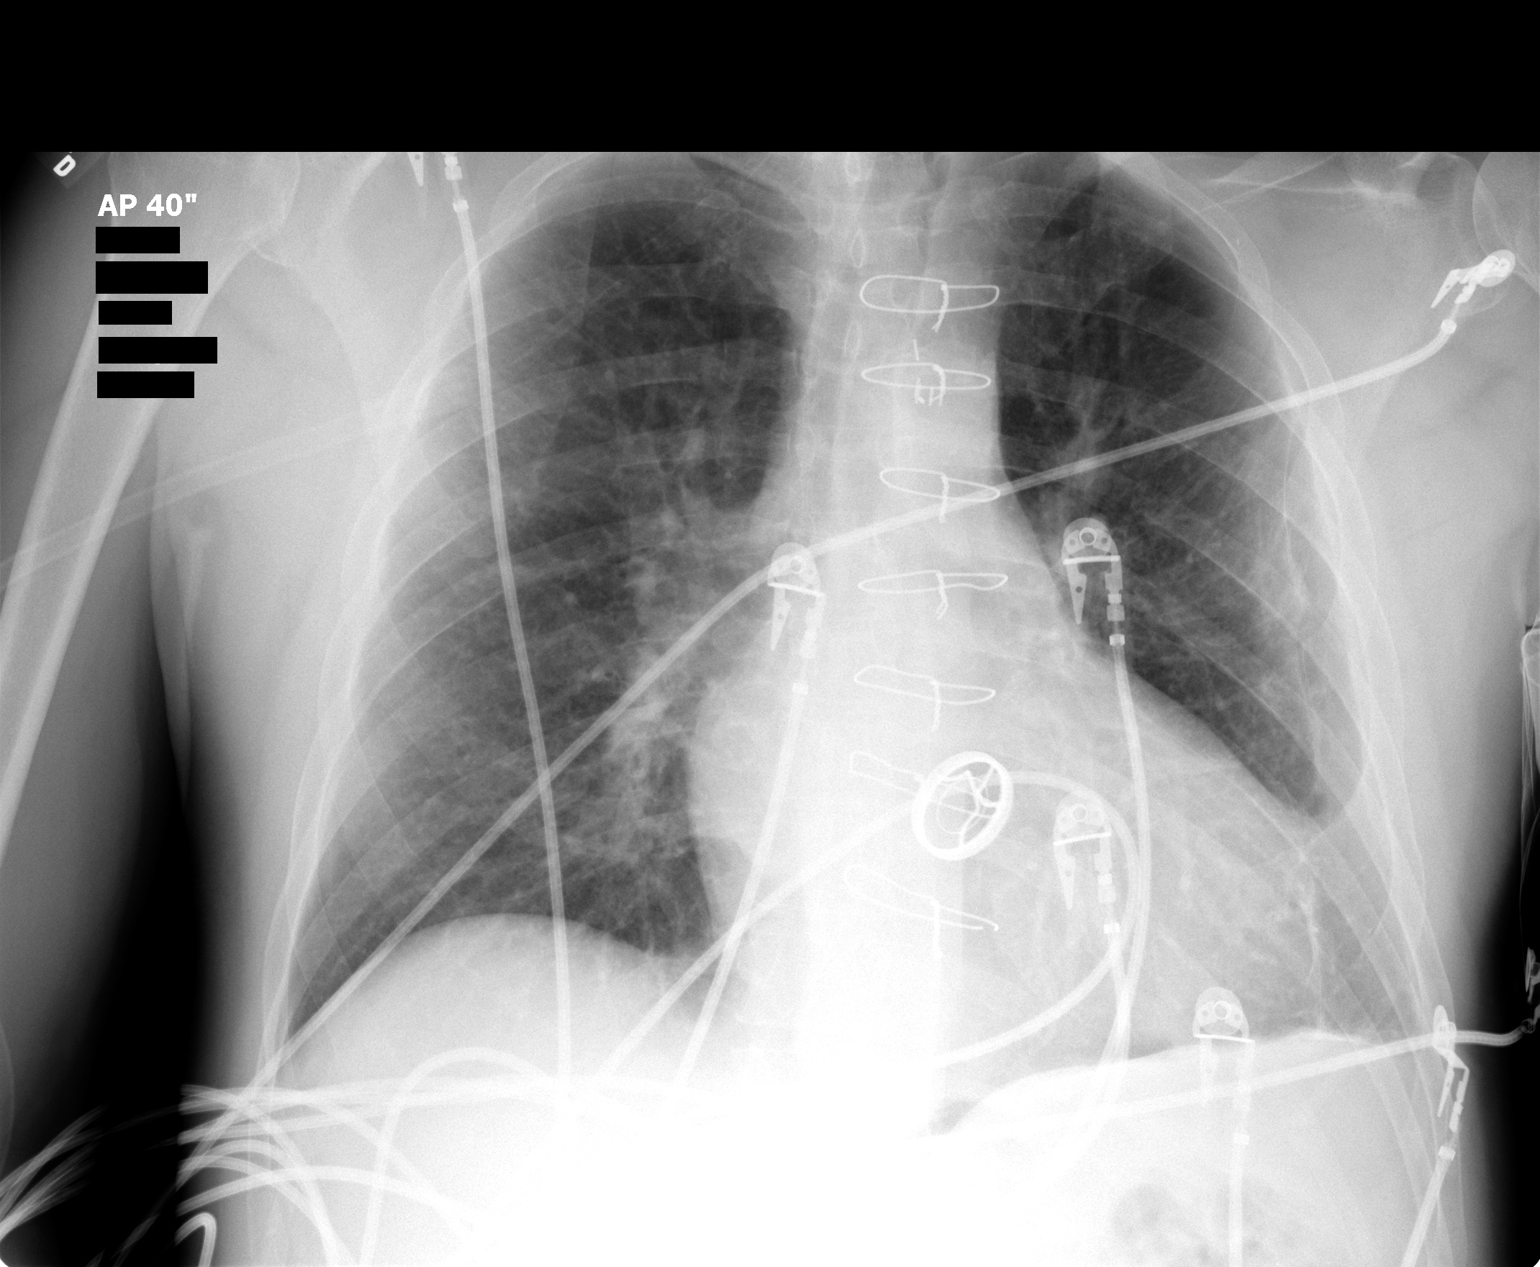

[1 of 1 positions shown; findings below may reference images not displayed]

## 2004-10-05 ENCOUNTER — Emergency Department (HOSPITAL_COMMUNITY): Admission: EM | Admit: 2004-10-05 | Discharge: 2004-10-06 | Payer: Self-pay | Admitting: Emergency Medicine

## 2004-10-20 IMAGING — CR DG CHEST 1V PORT
1 series · 1 of 1 positions shown · non-contrast
Comparison: One view chest 12/08/03.

CLINICAL DATA: Arrhythmia.  
 PORTABLE CHEST ONE VIEW 01/13/04 AT 7747 HOURS

[view not recorded]
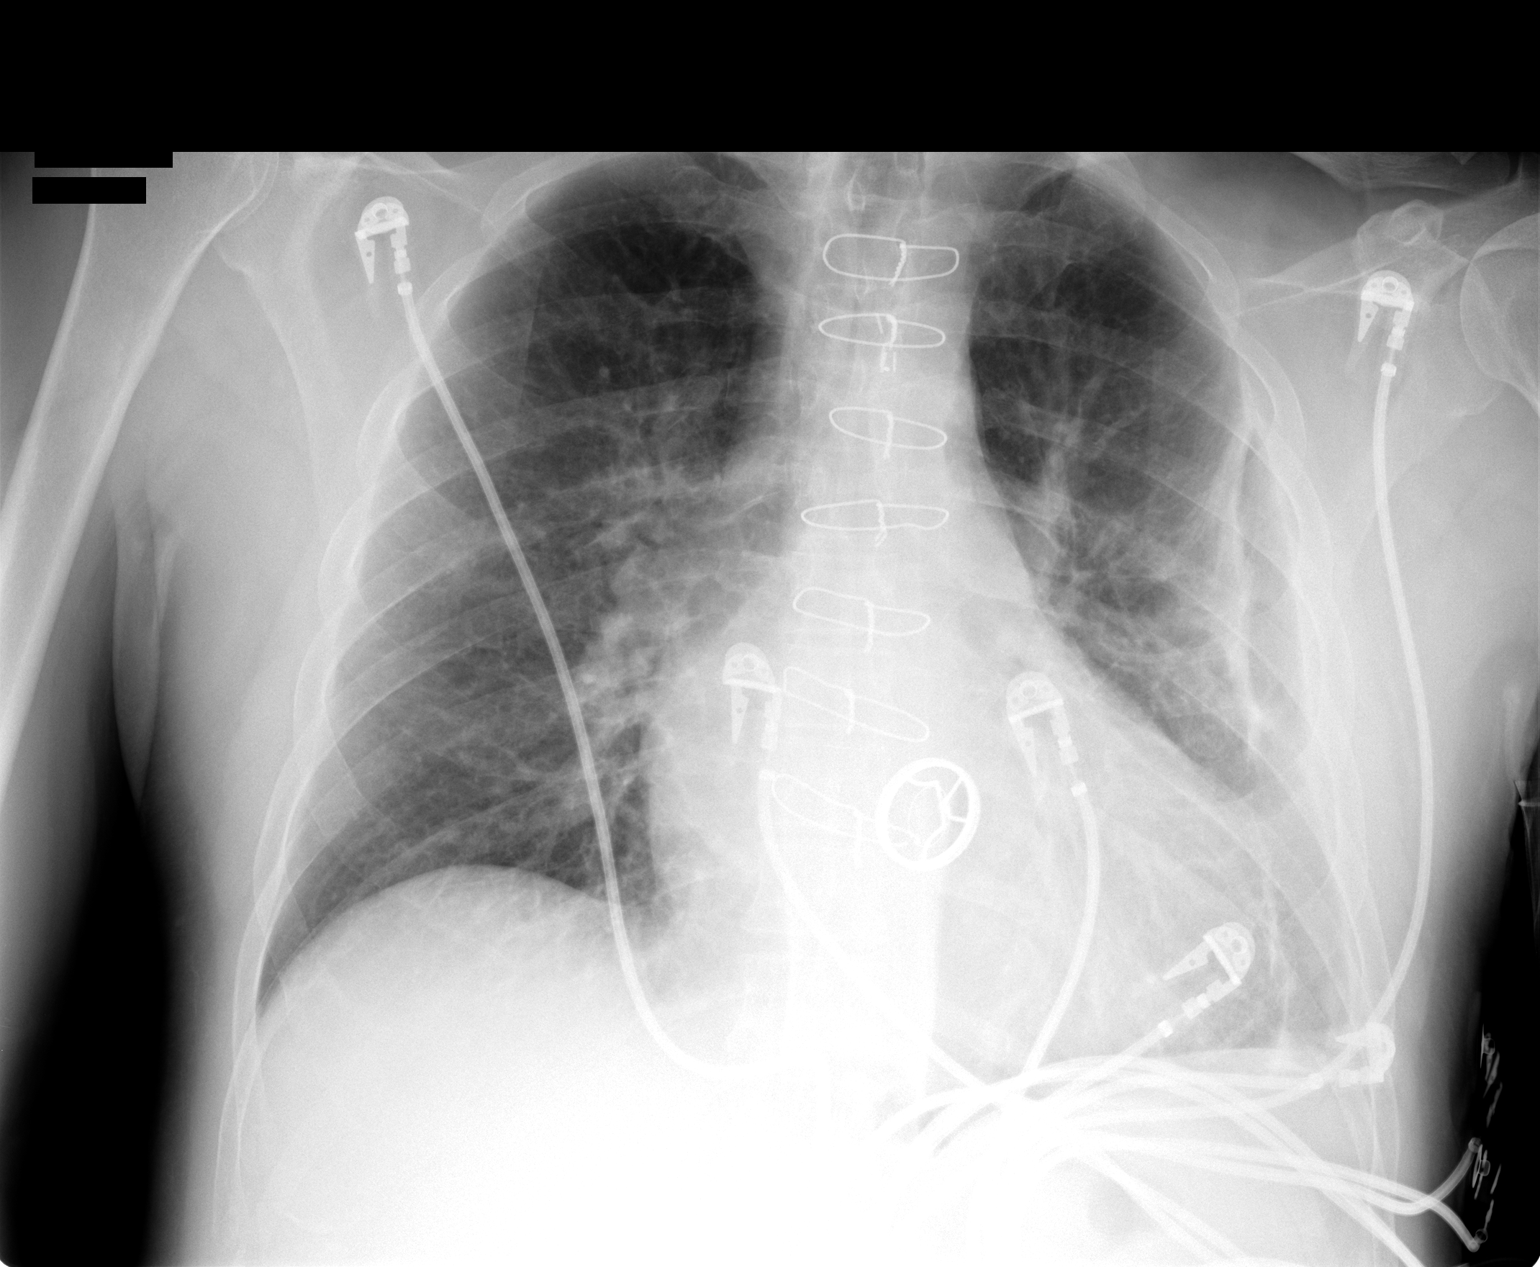

[1 of 1 positions shown; findings below may reference images not displayed]

FINDINGS: The patient has undergone previous sternotomy for aortic valve replacement.  The heart is enlarged though stable.  Mild pulmonary vascular congestion is present without overt edema.  Scarring in the left lung and pleura are again noted. 
 IMPRESSION
 Mild cardiomegaly.  Left lung scarring.  Vascular congestion without overt edema.

## 2004-10-20 IMAGING — CT CT PELVIS W/ CM
1 of 5 series · 13 of 32 positions shown, 18 images · IV contrast ([ID] OMNI 300)
Comparison: none

CLINICAL DATA: Abdominal pain.
 CT OF THE ABDOMEN AND PELVIS WITH CONTRAST, 01/13/04, 4104 HOURS
 Comparison 06/22/01.
TECHNIQUE: after the intravenous injection of 125 cc Omnipaque 300, 5 mm spiral images were obtained through the abdomen and pelvis.
 ABDOMEN

[Series 3: recon 2: a&p w/ · axial · 0.70mm/px · z∈[-444,-84]mm · 13 of 325 slices shown, 18 images]
[im 19/325  soft-tissue]
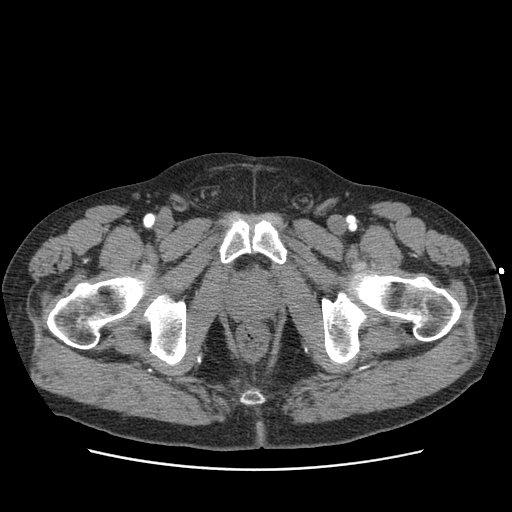
[im 19/325  bone]
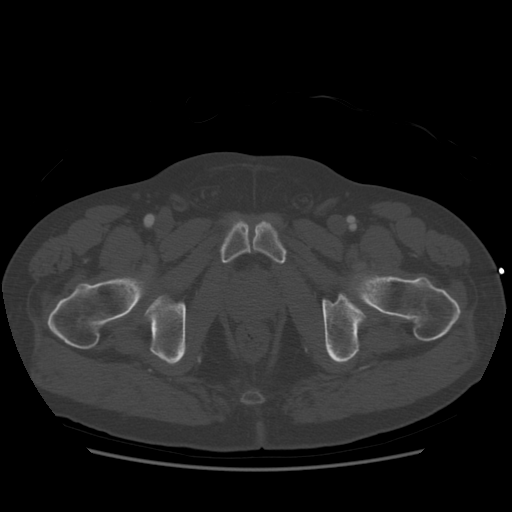
[im 55/325  soft-tissue]
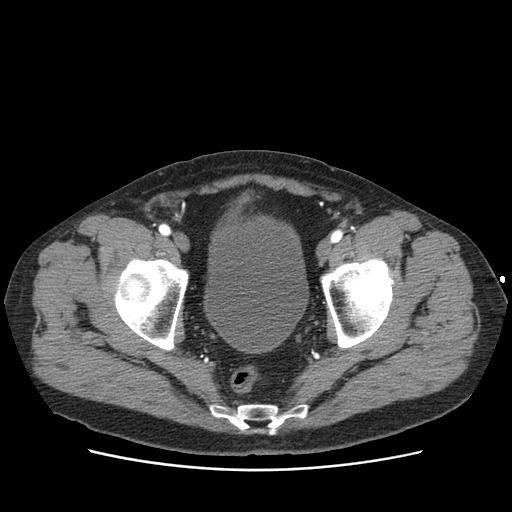
[im 73/325  soft-tissue]
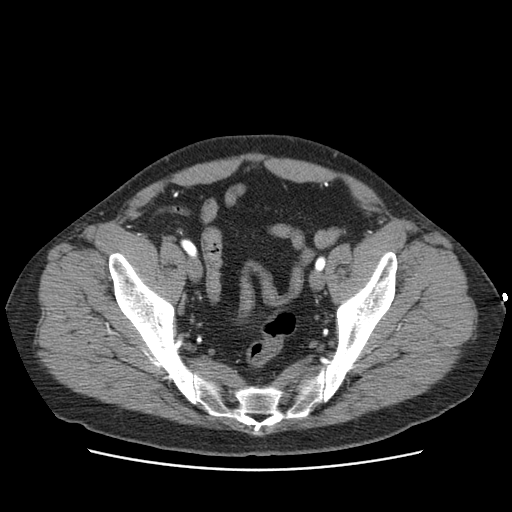
[im 91/325  soft-tissue]
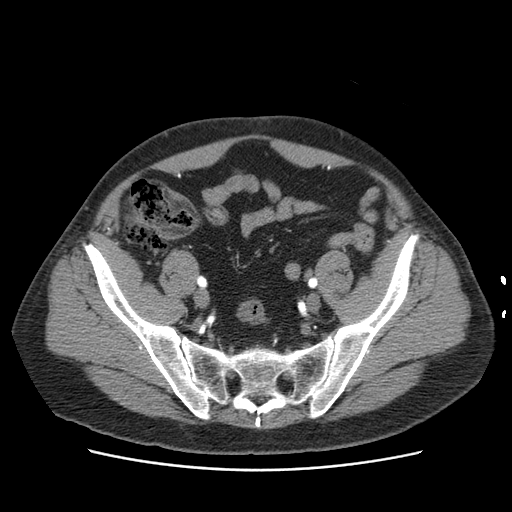
[im 127/325  soft-tissue]
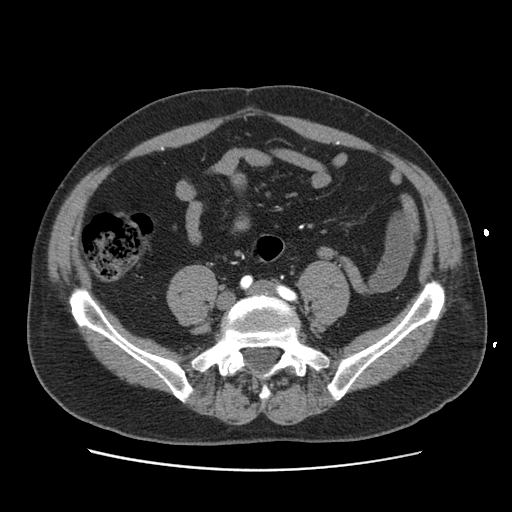
[im 145/325  soft-tissue]
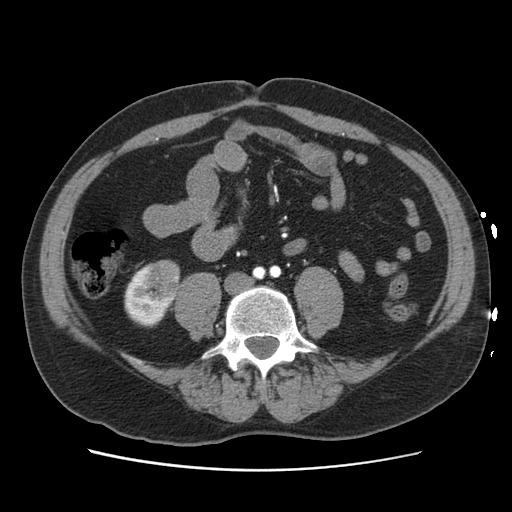
[im 181/325  soft-tissue]
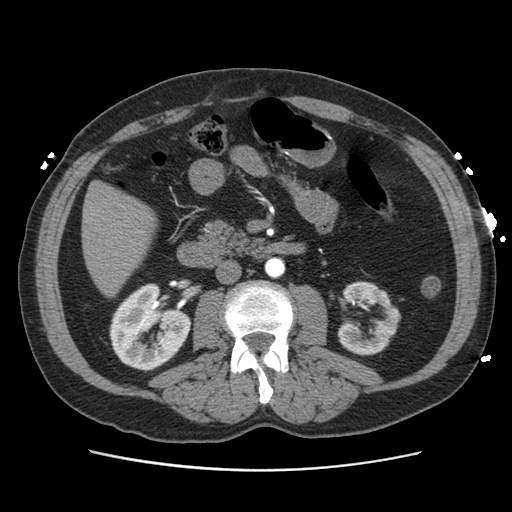
[im 199/325  soft-tissue]
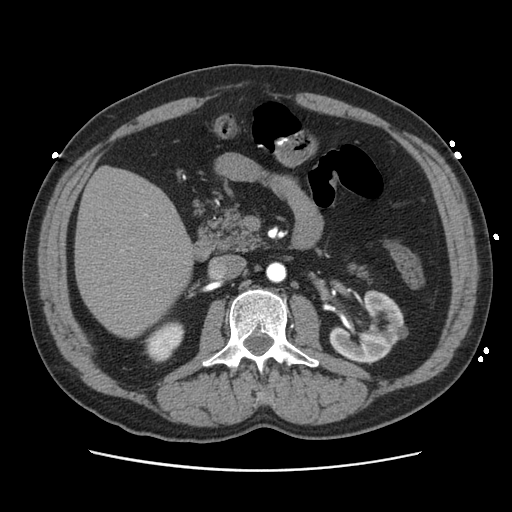
[im 235/325  soft-tissue]
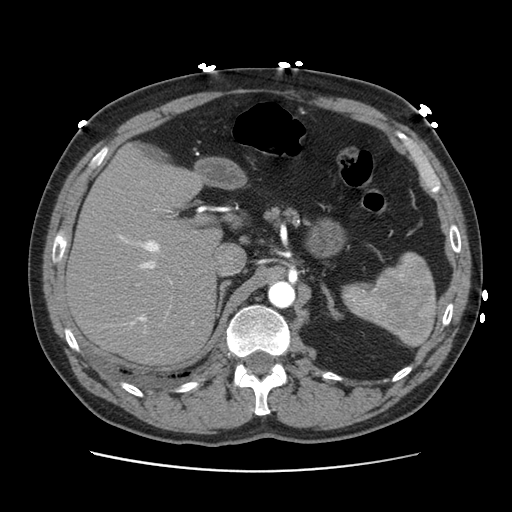
[im 235/325  bone]
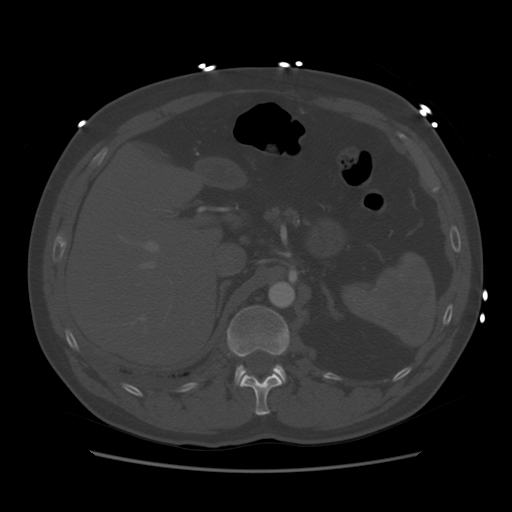
[im 253/325  soft-tissue]
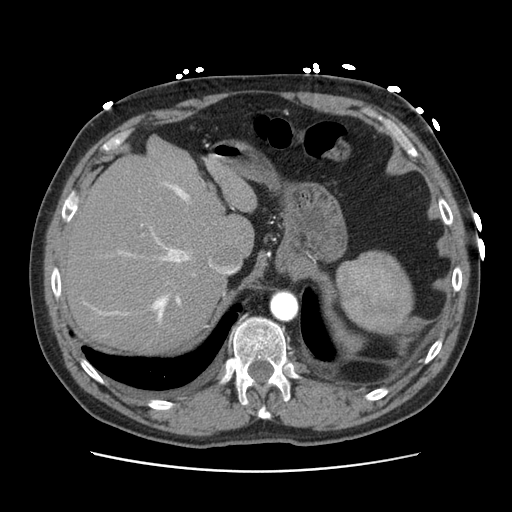
[im 253/325  lung]
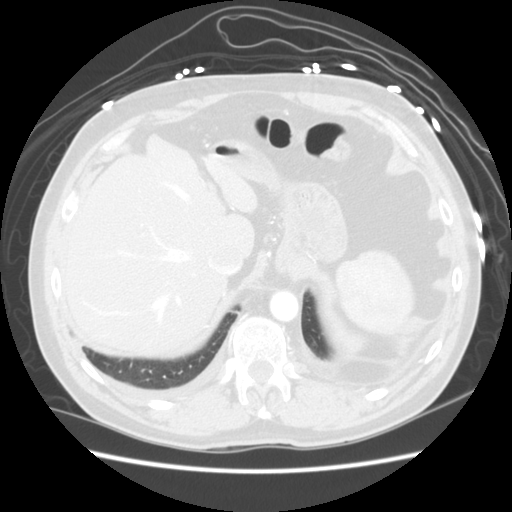
[im 271/325  soft-tissue]
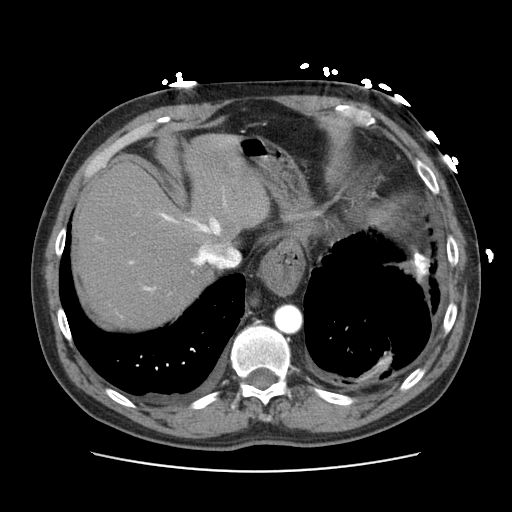
[im 271/325  lung]
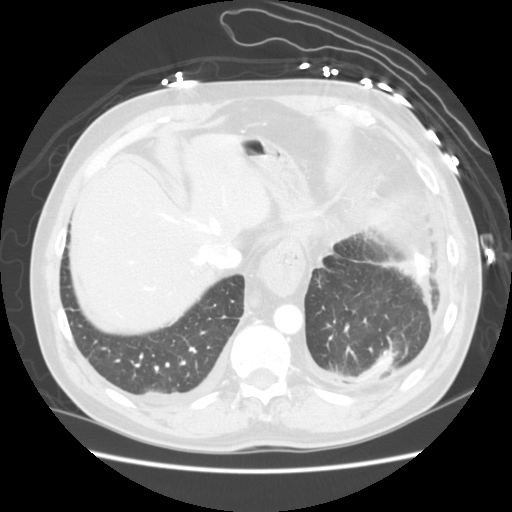
[im 289/325  lung]
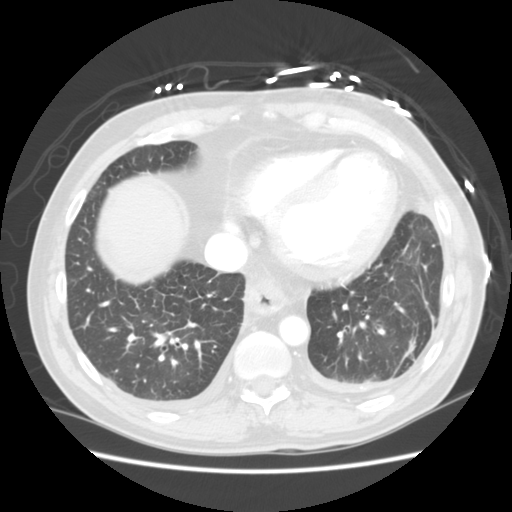
[im 307/325  soft-tissue]
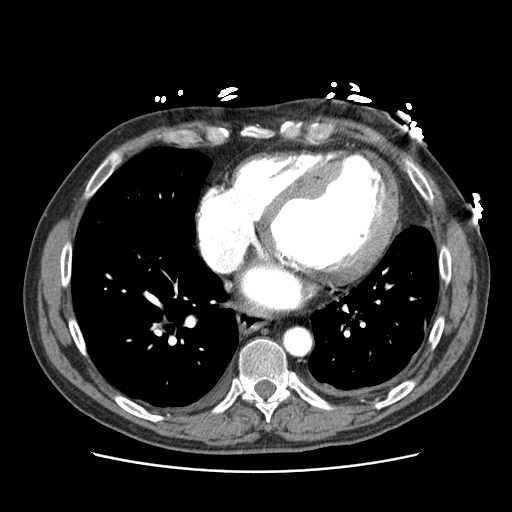
[im 307/325  lung]
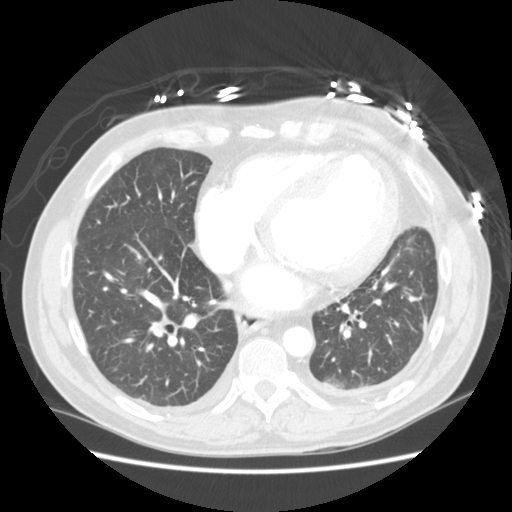

[13 of 32 positions shown; findings below may reference images not displayed]

FINDINGS: A prosthetic mitral valve is noted.  A large hiatal hernia is present.  Marked pleural thickening is present at both lungs bases with pleural calcifications at the left base including over the left hemidiaphragm as seen with asbestos exposure.  There are no nodular masslike pleural areas to suggest mesothelioma.  
 The aorta is nonaneurysmal.  The SMA and celiac are patent.  The small bowel is decompressed.  The pancreas, adrenal glands are within normal limits.  A small gallstone is present.  A small cyst is noted in the interpolar region of the left kidney.  At the lower pole of the right kidney, there is a 1.7 x 2.2 cm area of abnormal enhancement.  There is, however, no contour abnormality, and it is difficult to determine if this represents a renal mass or a renal infarct.  Also consider focal inflammation or infection of the right kidney.  There is no evidence of aortic dissection.  Mild atherosclerotic vascular calcifications are present in the distal aorta and iliac vessels.  Negative free fluid.  Negative adenopathy.  The appendix is not clearly visualized.  The previously visualized intussusception is no longer seen.  A ventral hernia is present, containing only intraabdominal fat.
 IMPRESSION 
 Gallstones.  
 Pleural thickening at both bases and pleural calcifications.  Considerations would include asbestos exposure, granulomatous disease.  There are no nodular arrears to suggest mesothelioma.
 Aorta is nonaneurysmal.  There is no aortic dissection.  
 Small cyst in the left kidney.
 Abnormal enhancement pattern in the lower pole of the right kidney.  There is no contour abnormality and this is favored to represent either focal infection or renal infarct.  Follow-up is, however, recommended, as small renal mass is not entirely excluded.
 Previously visualized intussusception is no longer seen.
 Hiatal hernia.
 Ventral hernia containing only fat.
 PELVIS
 The bladder is within normal limits.  The prostate is within normal limits.  Negative free fluid.  Negative adenopathy.
 IMPRESSION 
 No evidence of acute intrapelvic pathology.

## 2004-11-01 ENCOUNTER — Emergency Department (HOSPITAL_COMMUNITY): Admission: EM | Admit: 2004-11-01 | Discharge: 2004-11-01 | Payer: Self-pay | Admitting: Emergency Medicine

## 2004-12-01 ENCOUNTER — Observation Stay (HOSPITAL_COMMUNITY): Admission: EM | Admit: 2004-12-01 | Discharge: 2004-12-02 | Payer: Self-pay | Admitting: Emergency Medicine

## 2005-01-03 ENCOUNTER — Inpatient Hospital Stay (HOSPITAL_COMMUNITY): Admission: EM | Admit: 2005-01-03 | Discharge: 2005-01-05 | Payer: Self-pay | Admitting: Emergency Medicine

## 2005-01-05 ENCOUNTER — Ambulatory Visit: Payer: Self-pay | Admitting: Psychiatry

## 2005-01-05 ENCOUNTER — Inpatient Hospital Stay (HOSPITAL_COMMUNITY): Admission: RE | Admit: 2005-01-05 | Discharge: 2005-01-12 | Payer: Self-pay | Admitting: Psychiatry

## 2005-01-12 ENCOUNTER — Inpatient Hospital Stay (HOSPITAL_COMMUNITY): Admission: EM | Admit: 2005-01-12 | Discharge: 2005-01-16 | Payer: Self-pay | Admitting: *Deleted
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
## 2005-03-15 IMAGING — CT CT HEAD W/O CM
1 of 2 series · 13 of 30 positions shown, 17 images · IV contrast (agent unspecified)
Comparison: none

CLINICAL DATA: Felt something pop in head and blurred vision.
 CT HEAD WITHOUT CONTRAST:
 5 mm collimated images were obtained without contrast and compared with 07/17/03.
 There is moderate atrophy which is premature for the patient?s age of 58.  Bilateral strokes are again noted with the largest affecting the right frontal region.  I see no evidence for acute hemorrhage or stroke.  Compared to the previous study, there is no interval change.  Bone windows reveal no fracture or significant sinus opacity.

[Series 2: brain · axial · 0.47mm/px · z∈[+155,+278]mm · 13 of 28 slices shown, 17 images]
[im 2/28  brain]
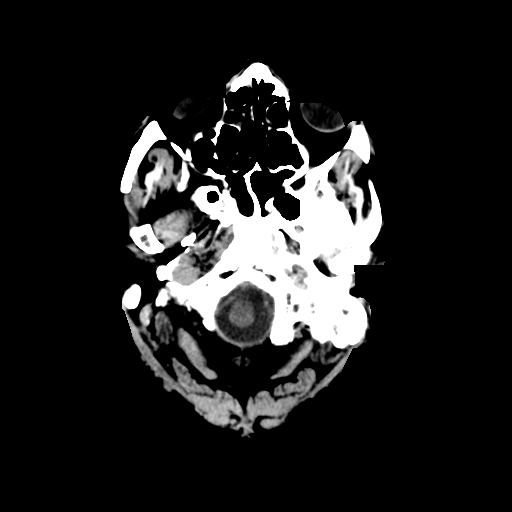
[im 2/28  bone]
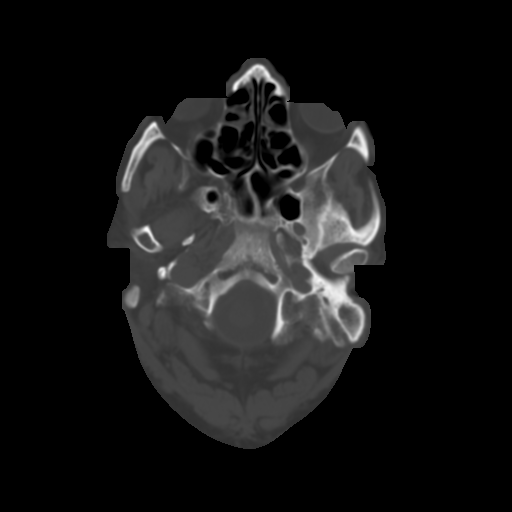
[im 4/28  brain]
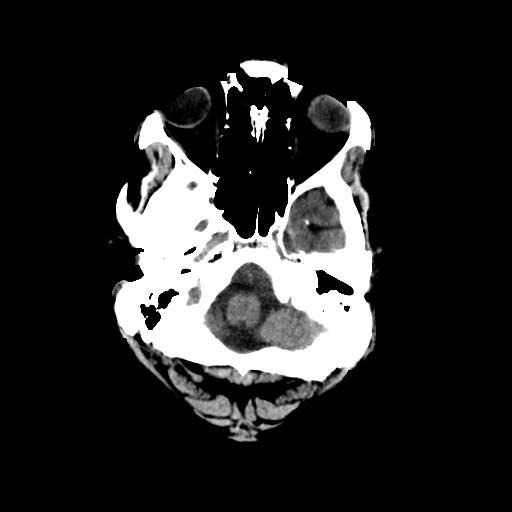
[im 6/28  brain]
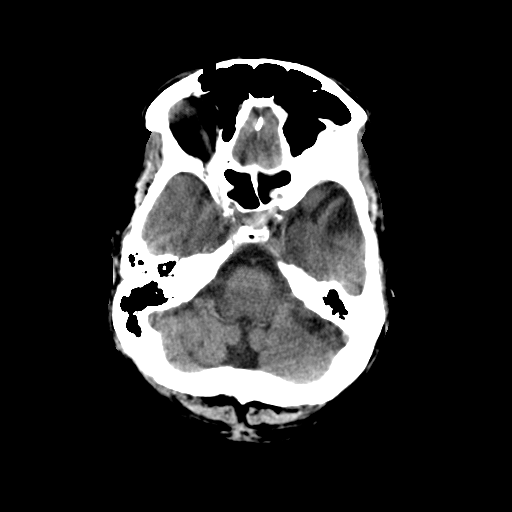
[im 8/28  brain]
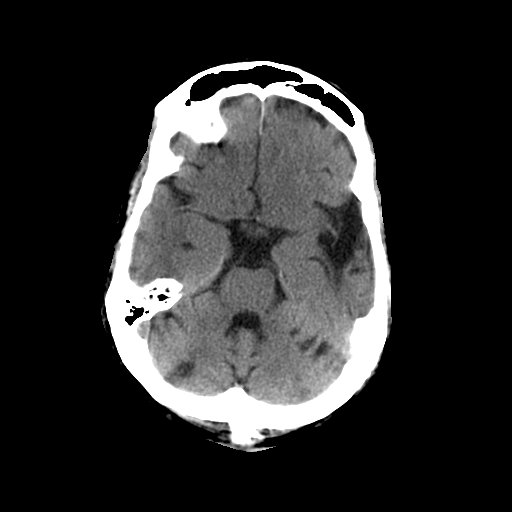
[im 10/28  brain]
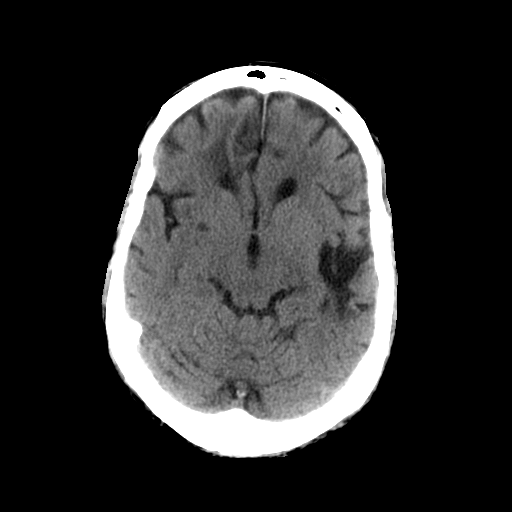
[im 10/28  bone]
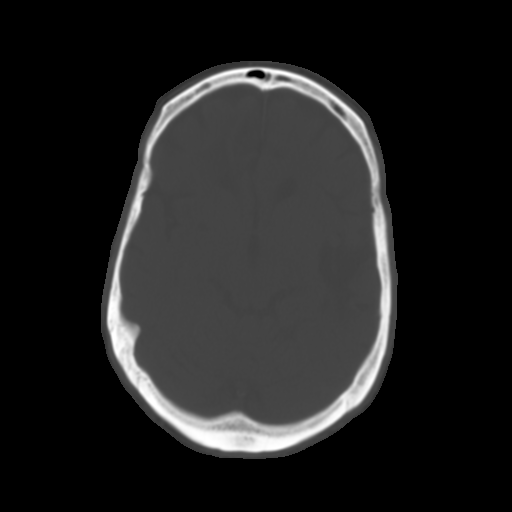
[im 12/28  brain]
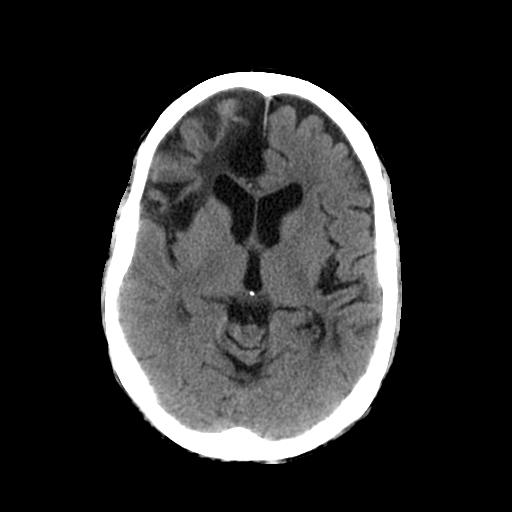
[im 14/28  brain]
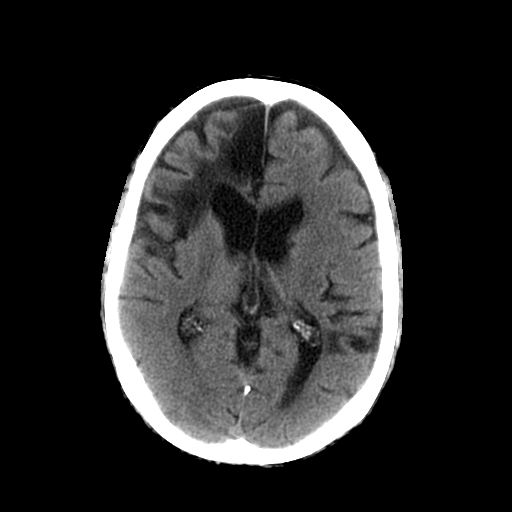
[im 16/28  brain]
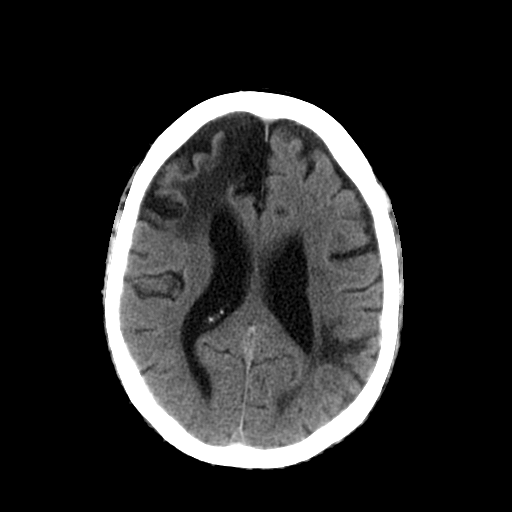
[im 18/28  brain]
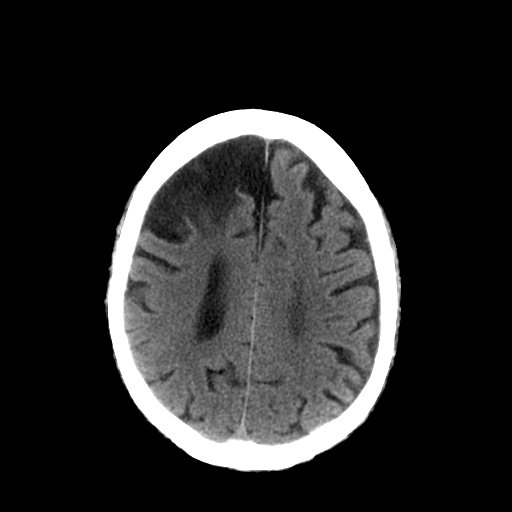
[im 18/28  bone]
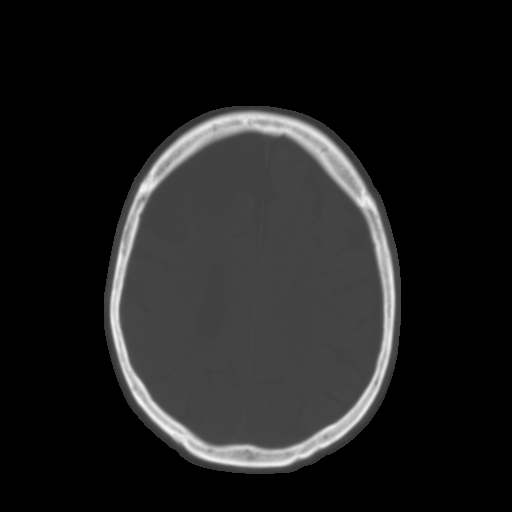
[im 20/28  brain]
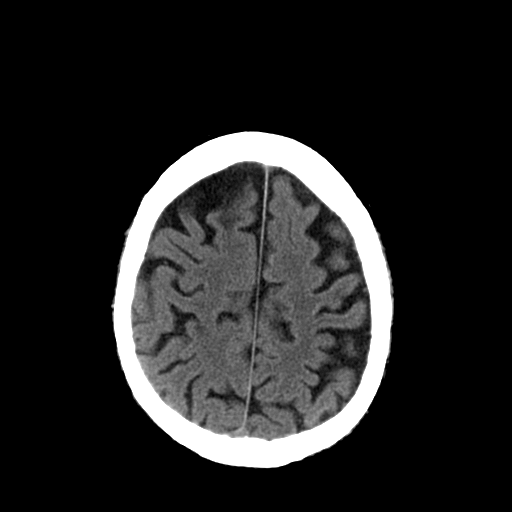
[im 22/28  brain]
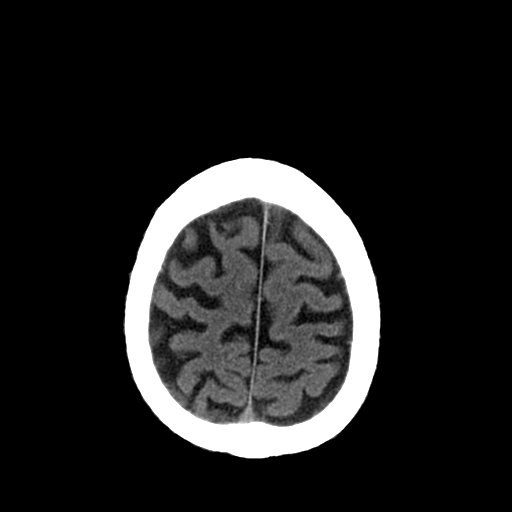
[im 24/28  brain]
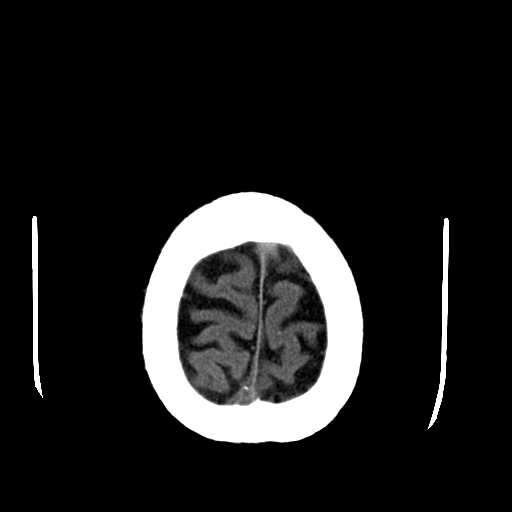
[im 26/28  brain]
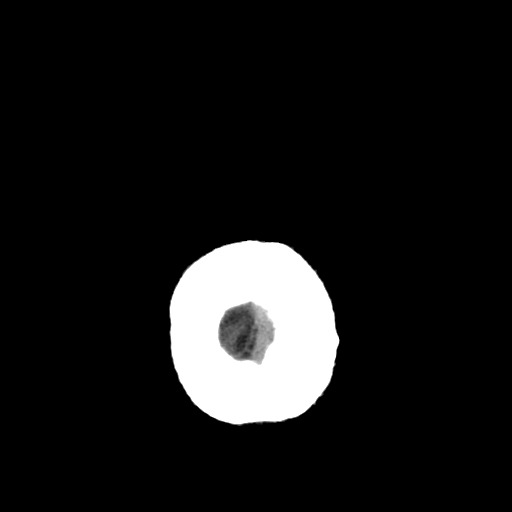
[im 26/28  bone]
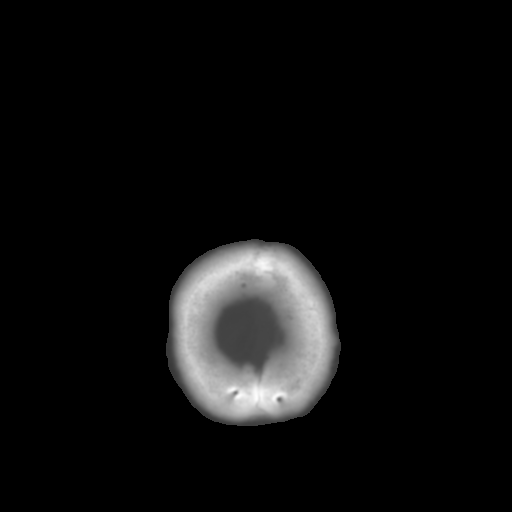

[13 of 30 positions shown; findings below may reference images not displayed]

IMPRESSION: 1.  Chronic atrophic change with numerous remote infarcts.
 2.  No definite acute pathology.

## 2005-03-21 ENCOUNTER — Emergency Department (HOSPITAL_COMMUNITY): Admission: EM | Admit: 2005-03-21 | Discharge: 2005-03-22 | Payer: Self-pay | Admitting: Emergency Medicine

## 2005-04-15 IMAGING — CR DG ABD PORTABLE 1V
1 series · 1 of 1 positions shown · non-contrast
Comparison: 04/18/04.

CLINICAL DATA: Abdominal pain and distention.  /dyspnea.  Smoker.
 PORTABLE CHEST:

[view not recorded]
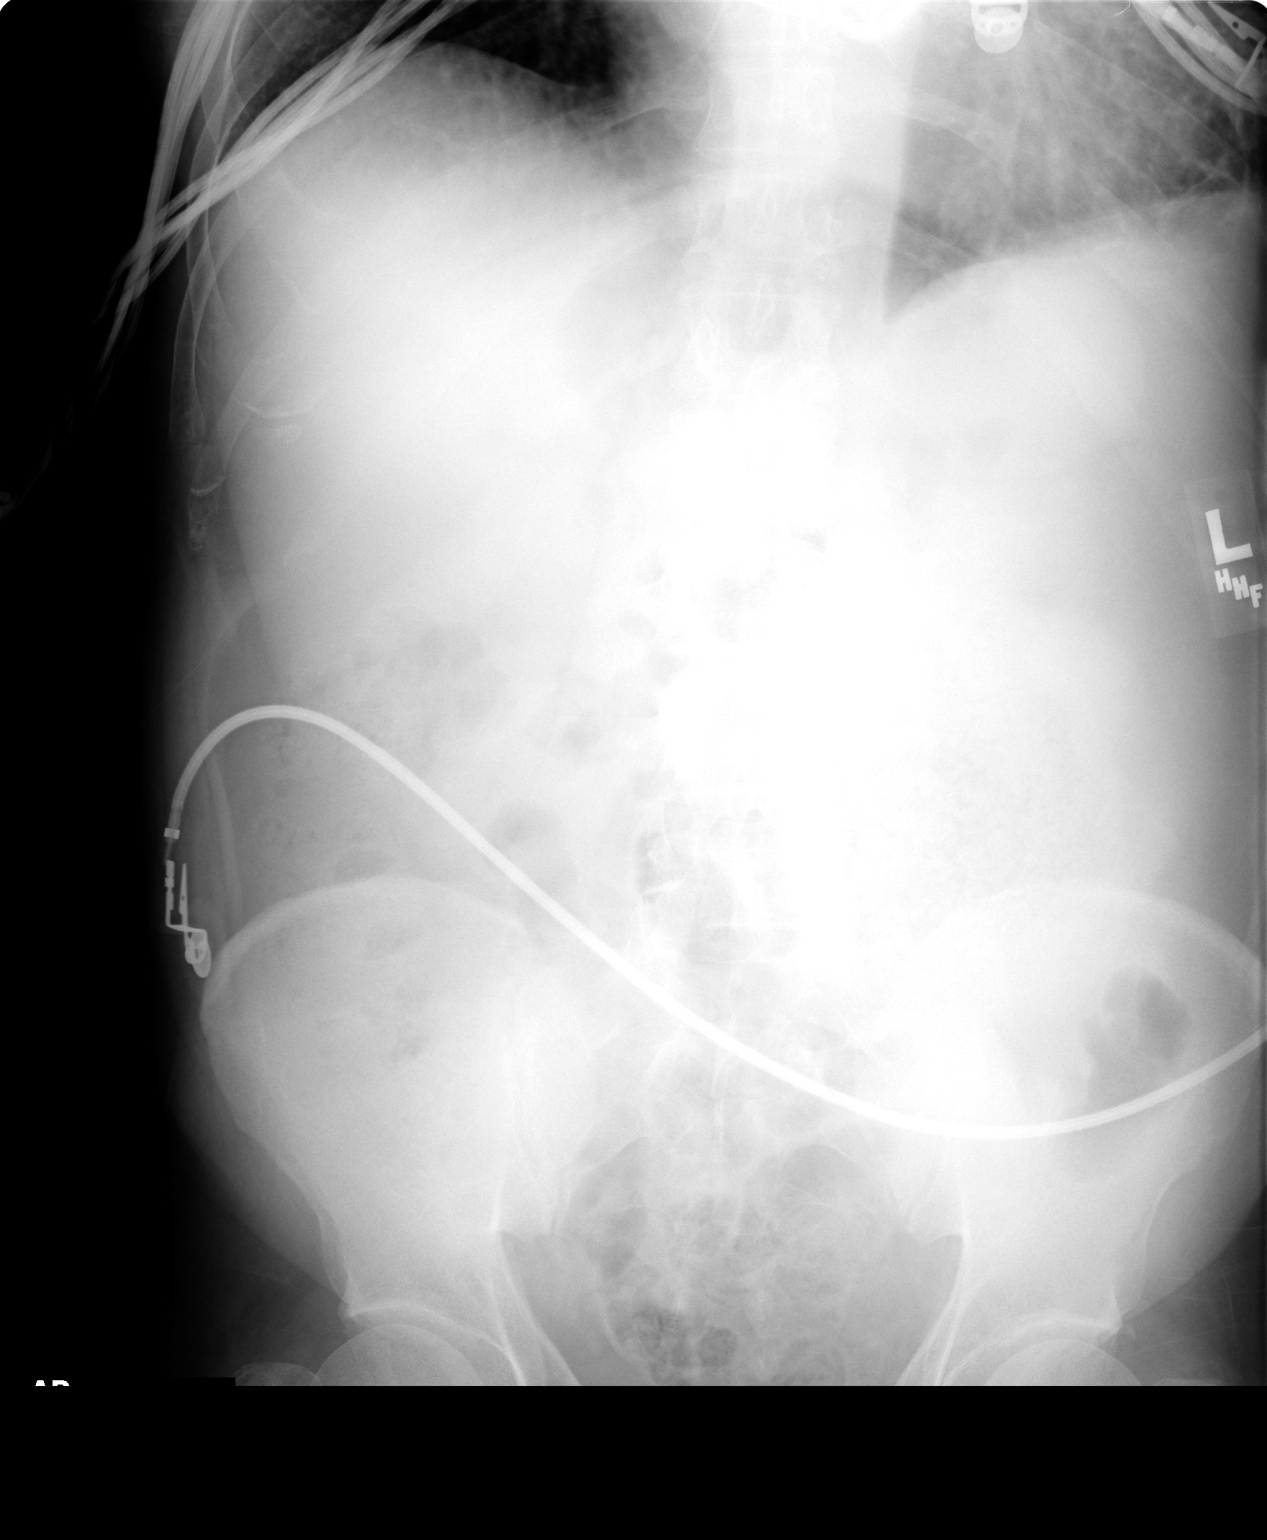

[1 of 1 positions shown; findings below may reference images not displayed]

Cardiomegaly, valve replacement, and scattered areas of pulmonary scarring are noted. Pulmonary vascular congestion, mild interstitial edema noted.
IMPRESSION: 1.  Cardiomegaly and mild interstitial edema.
 2. Chronic scarring in the left lung.
 PORTABLE ABDOMEN:
 Unremarkable bowel gas pattern.  No evidence of bowel obstruction.
IMPRESSION: No acute abnormality.

## 2005-04-15 IMAGING — CT CT ABDOMEN W/ CM
1 of 3 series · 14 of 32 positions shown, 19 images · IV contrast (GASTRO & [ID] OMNI 300)
Comparison: none

CLINICAL DATA: Right-sided abdominal pain.
 CT SCAN OF THE ABDOMEN WITH CONTRAST:
 Spiral scanning is performed after oral administration of dilute contrast and during intravenous administration of 100 cc of Omnipaque 300.  Comparison is made to previous examinations, most recently 01/13/04.
 The patient continues to show chronic pleural and parenchymal scarring at the lung bases.  The heart is enlarged.  There has been previous mitral valve replacement.  The liver shows no focal lesions or biliary ductal dilatation.  There are a few small gallstones but no evidence of cholecystitis.  The spleen and pancreas appear normal.  The adrenal glands are normal.  The left kidney again shows a 1 cm cyst projecting laterally at the midportion.  The right kidney shows a small area of low density at the lower pole which has not changed since the previous study.  This appears to be an area of atrophy or scarring.  The aorta and IVC are unremarkable.  No bowel pathology is seen.  The patient has a ventral hernia just to the left of midline in the epigastrium which contains a small bit of mesenteric fat.  No herniated bowel.

[Series 2: routine abdomen · axial · 0.73mm/px · z∈[-482,-102]mm · 14 of 86 slices shown, 19 images]
[im 5/86  soft-tissue]
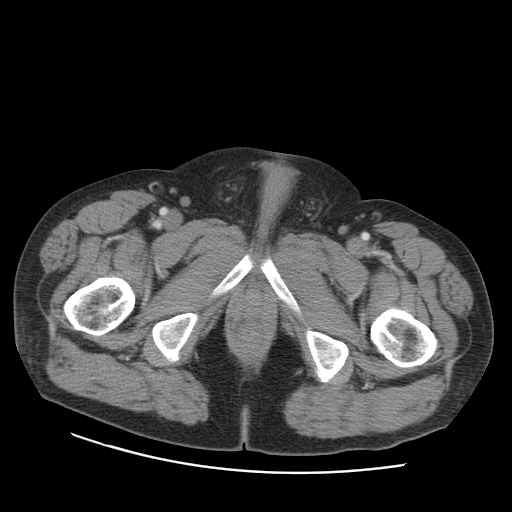
[im 5/86  bone]
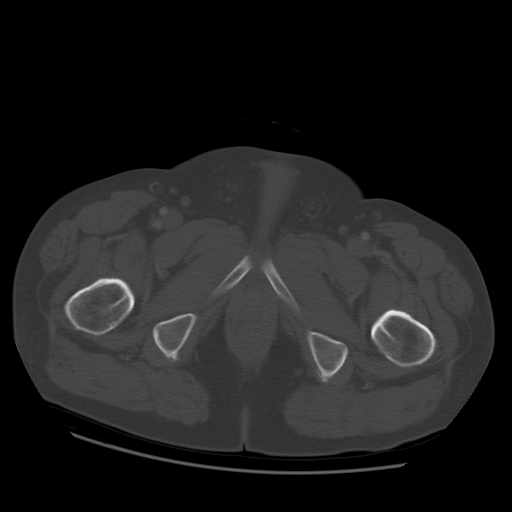
[im 13/86  soft-tissue]
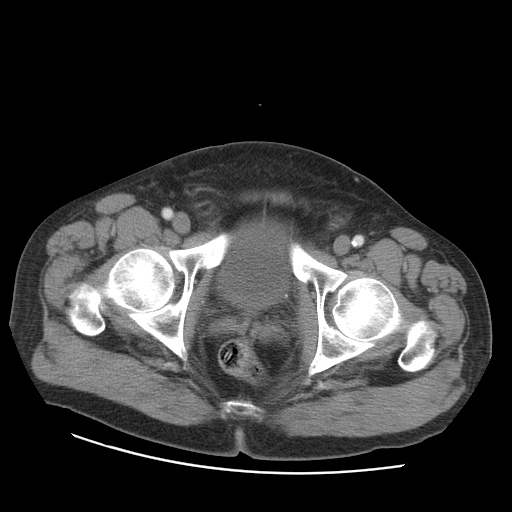
[im 18/86  soft-tissue]
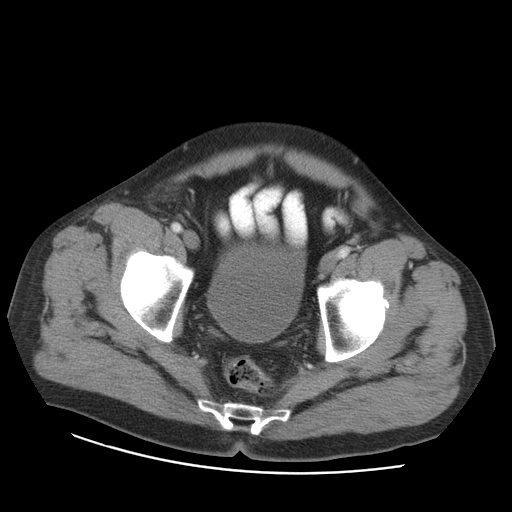
[im 26/86  soft-tissue]
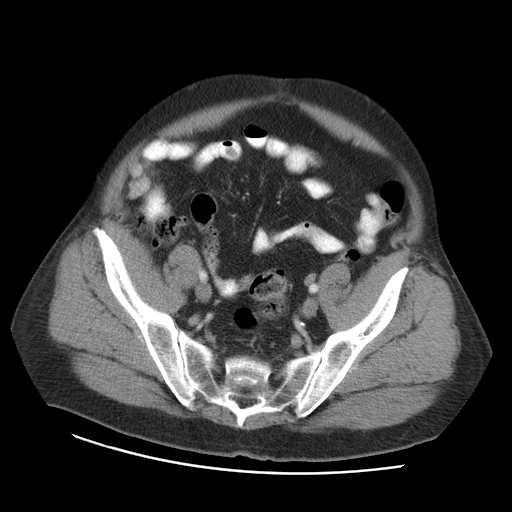
[im 30/86  soft-tissue]
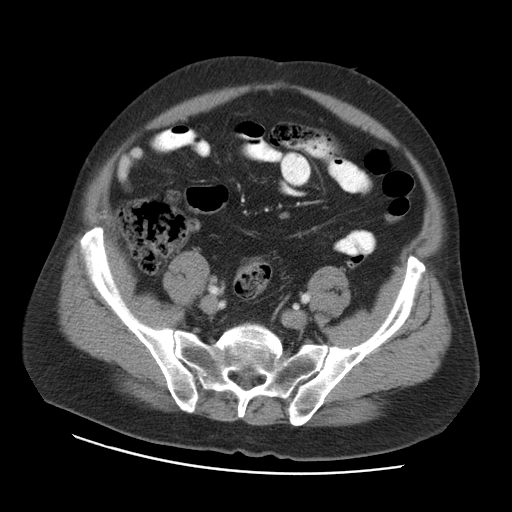
[im 39/86  soft-tissue]
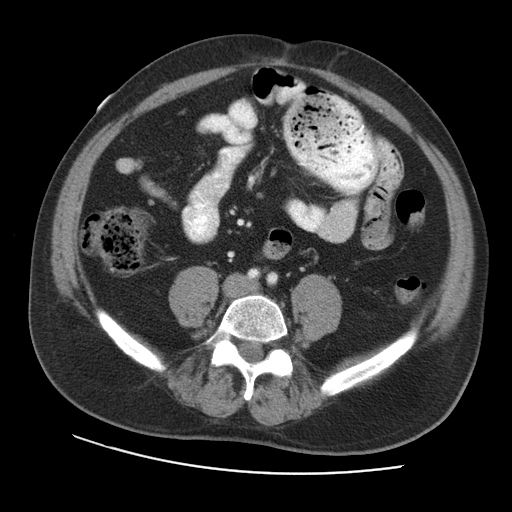
[im 43/86  soft-tissue]
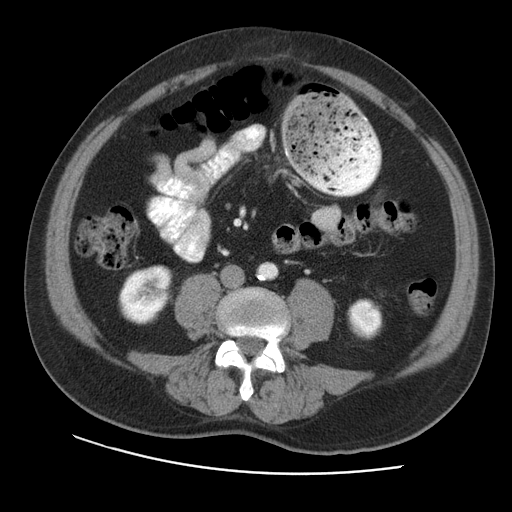
[im 47/86  soft-tissue]
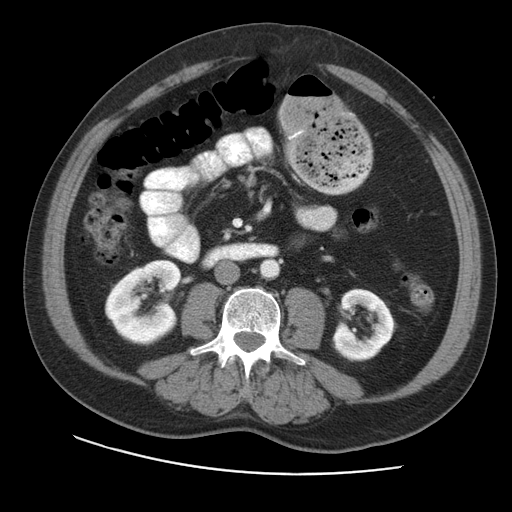
[im 56/86  soft-tissue]
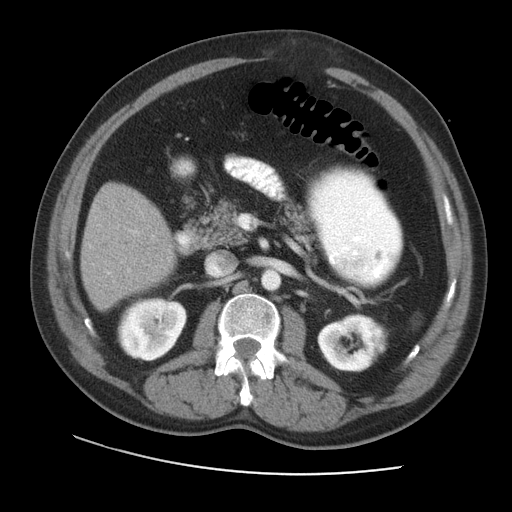
[im 56/86  bone]
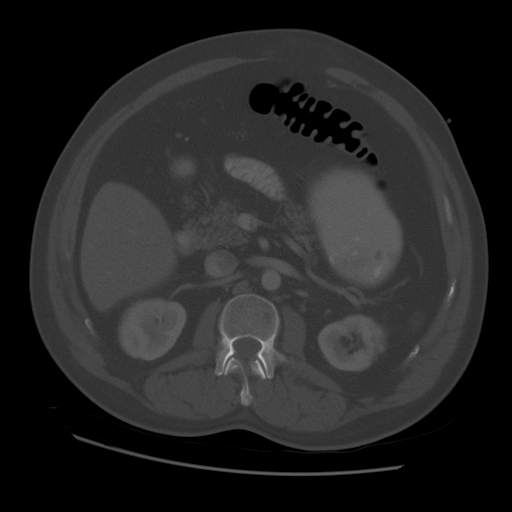
[im 60/86  soft-tissue]
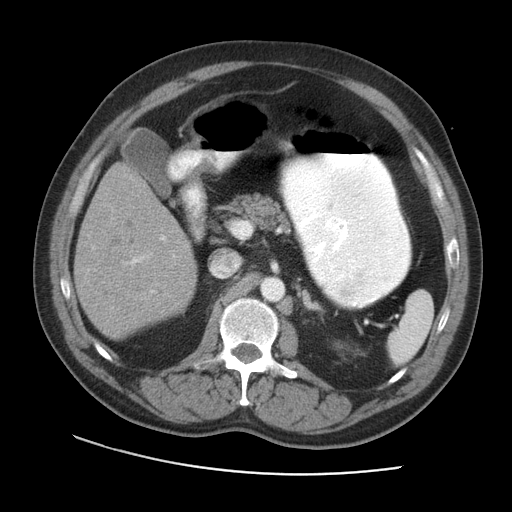
[im 69/86  soft-tissue]
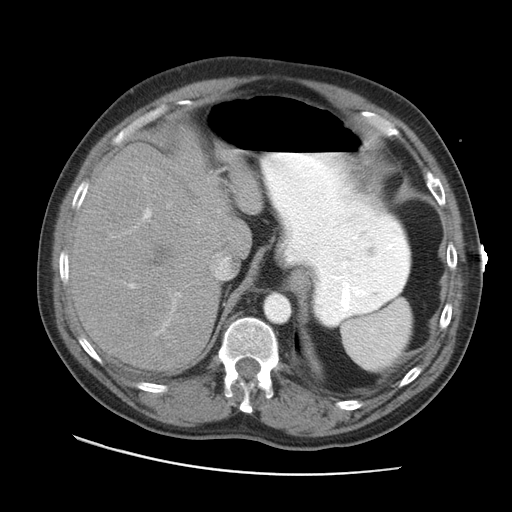
[im 69/86  lung]
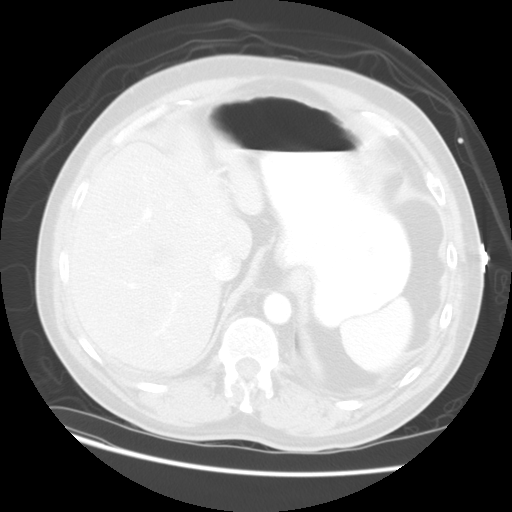
[im 73/86  soft-tissue]
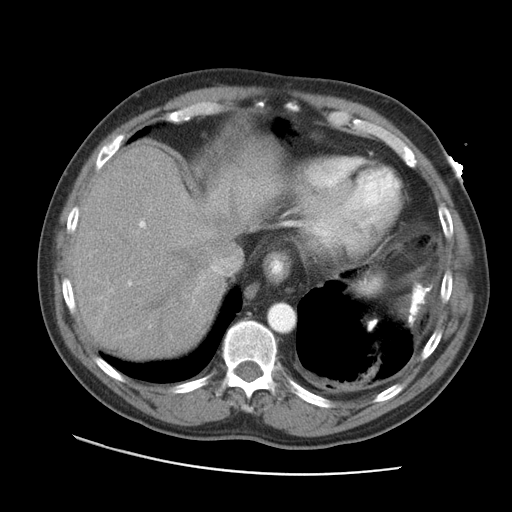
[im 73/86  lung]
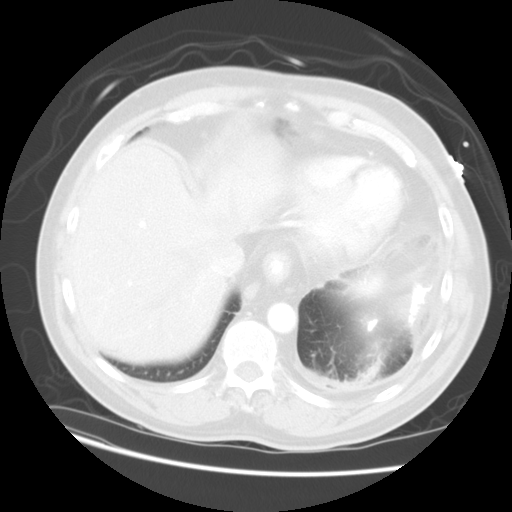
[im 77/86  lung]
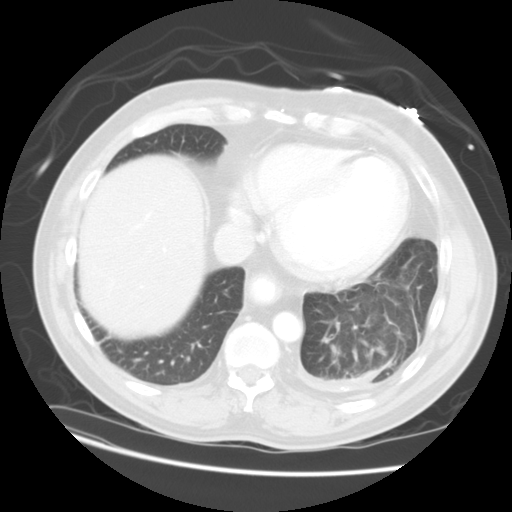
[im 81/86  soft-tissue]
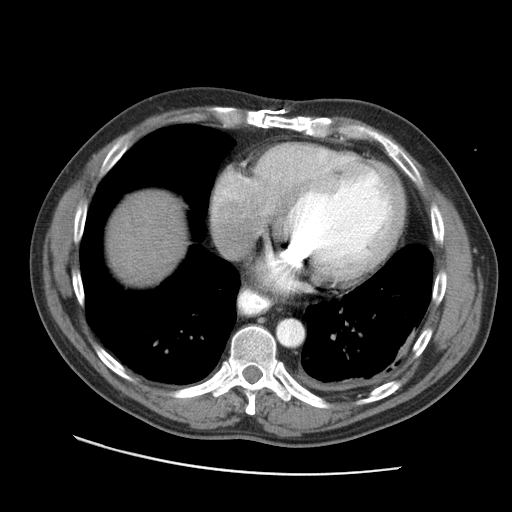
[im 81/86  lung]
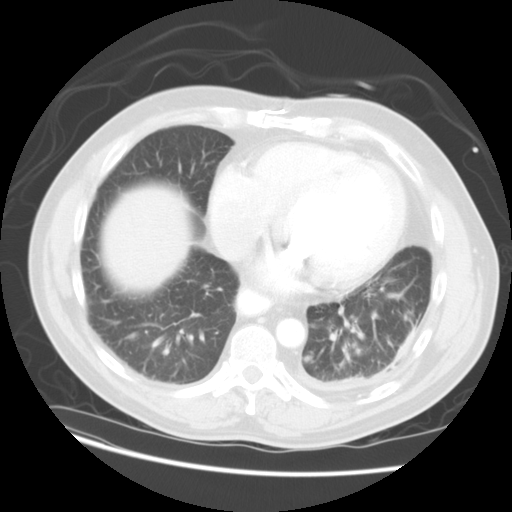

[14 of 32 positions shown; findings below may reference images not displayed]

IMPRESSION: 1.  No acute or significant finding in the abdomen.
 2.  Ventral hernia.
 3.  Small gallstones.
 CT SCAN OF THE PELVIS WITH CONTRAST:
 Spiral scanning is performed after oral and intravenous contrast administration.  
 There is no free fluid in the pelvis.  The prostate gland and seminal vesicles appear unremarkable.  No bladder pathology is seen.  No retroperitoneal mass or adenopathy.  No bowel pathology is evident.  The right colon and appendix appear normal.  No distal small bowel pathology is evident.
IMPRESSION: Negative CT scan of the pelvis.

## 2005-04-15 IMAGING — CR DG CHEST 1V PORT
1 series · 1 of 1 positions shown · non-contrast
Comparison: Earlier study on this same date.

CLINICAL DATA: Difficulty breathing.  
 PORTABLE CHEST ([DATE] P.M.):

[view not recorded]
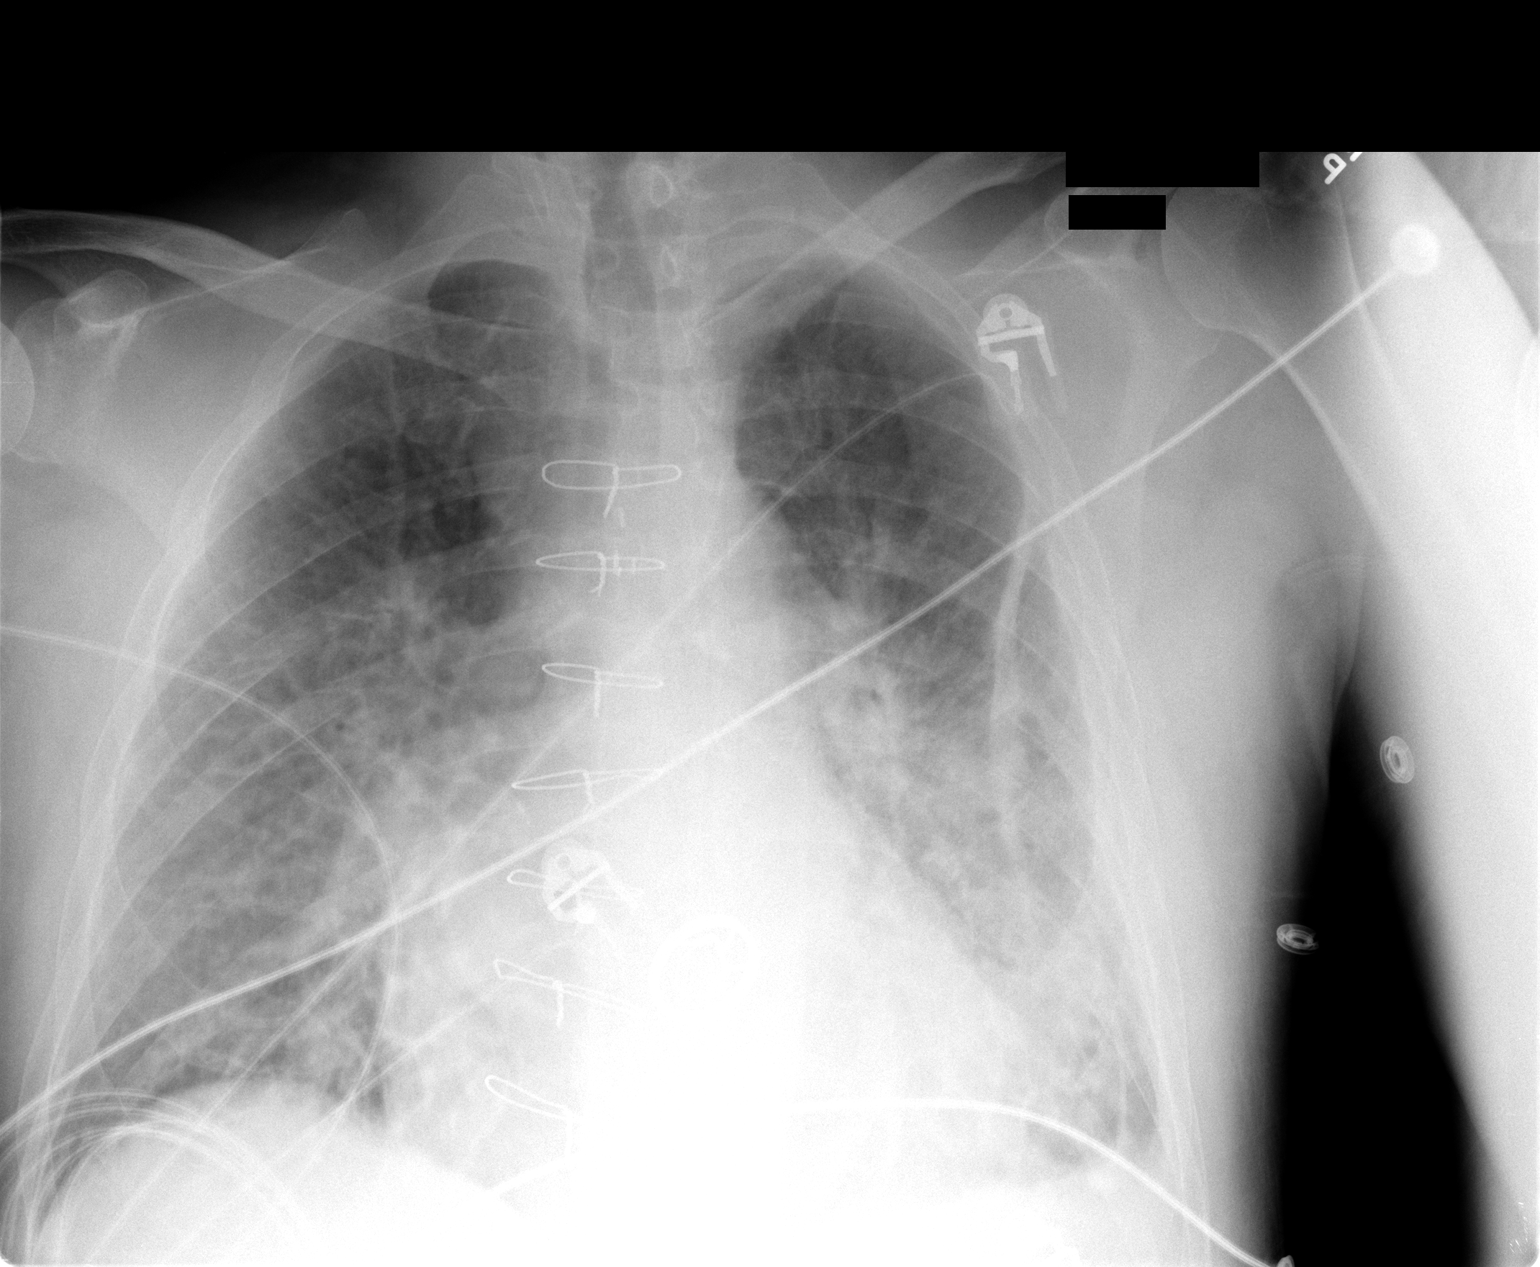

[1 of 1 positions shown; findings below may reference images not displayed]

The patient has developed bilateral extensive pulmonary edema.  Prosthetic valve is noted.  There appears to be some linear scarring in the left hemithorax.
IMPRESSION: Acute pulmonary edema, increased.

## 2005-04-15 IMAGING — CR DG CHEST 1V PORT
1 series · 1 of 1 positions shown · non-contrast
Comparison: 04/18/04.

CLINICAL DATA: Abdominal pain and distention.  /dyspnea.  Smoker.
 PORTABLE CHEST:

[view not recorded]
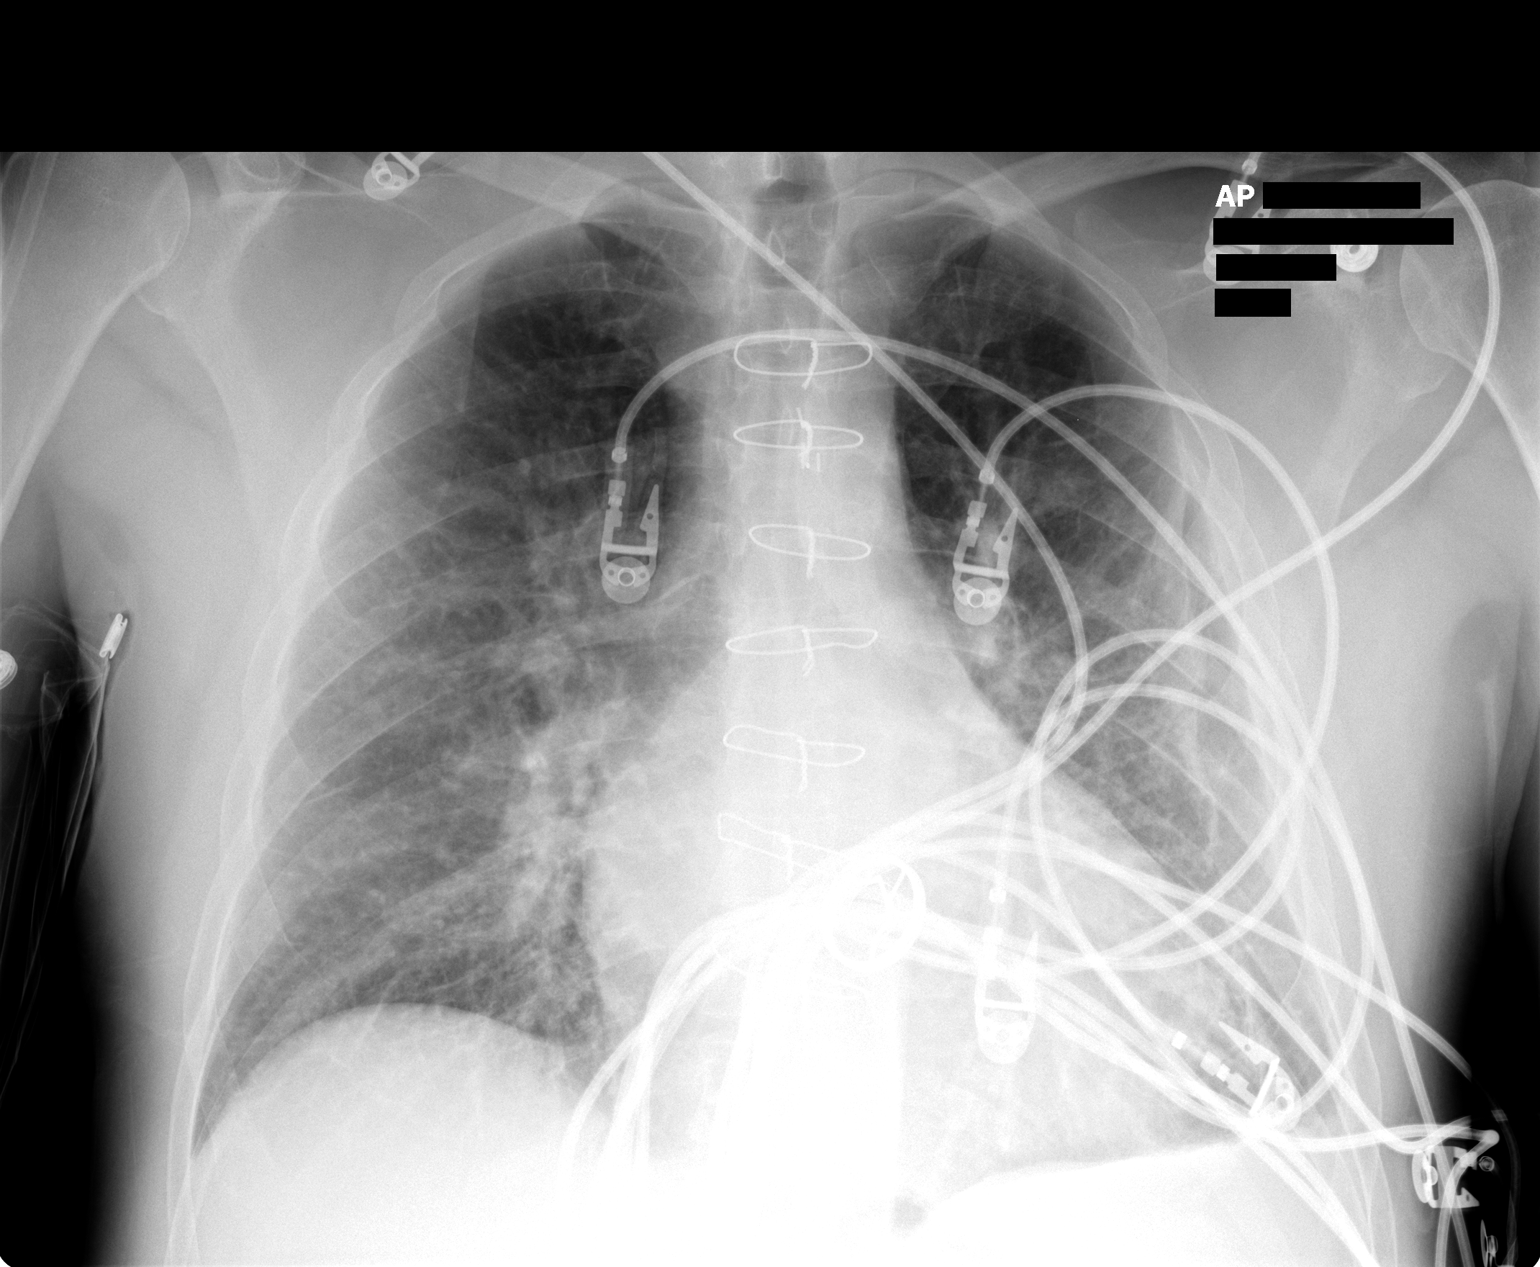

[1 of 1 positions shown; findings below may reference images not displayed]

Cardiomegaly, valve replacement, and scattered areas of pulmonary scarring are noted. Pulmonary vascular congestion, mild interstitial edema noted.
IMPRESSION: 1.  Cardiomegaly and mild interstitial edema.
 2. Chronic scarring in the left lung.
 PORTABLE ABDOMEN:
 Unremarkable bowel gas pattern.  No evidence of bowel obstruction.
IMPRESSION: No acute abnormality.

## 2005-05-12 ENCOUNTER — Encounter: Admission: RE | Admit: 2005-05-12 | Discharge: 2005-05-12 | Payer: Self-pay | Admitting: Cardiology

## 2005-05-15 IMAGING — CR DG CHEST 1V PORT
1 series · 1 of 1 positions shown · non-contrast
Comparison: Portable chest x-ray 07/08/2004.

CLINICAL DATA: Tachycardia. History of aortic valve replacement. Shortness of
breath.

PORTABLE CHEST - 1 VIEW  [DATE]/1225 0902 hours:

[view not recorded]
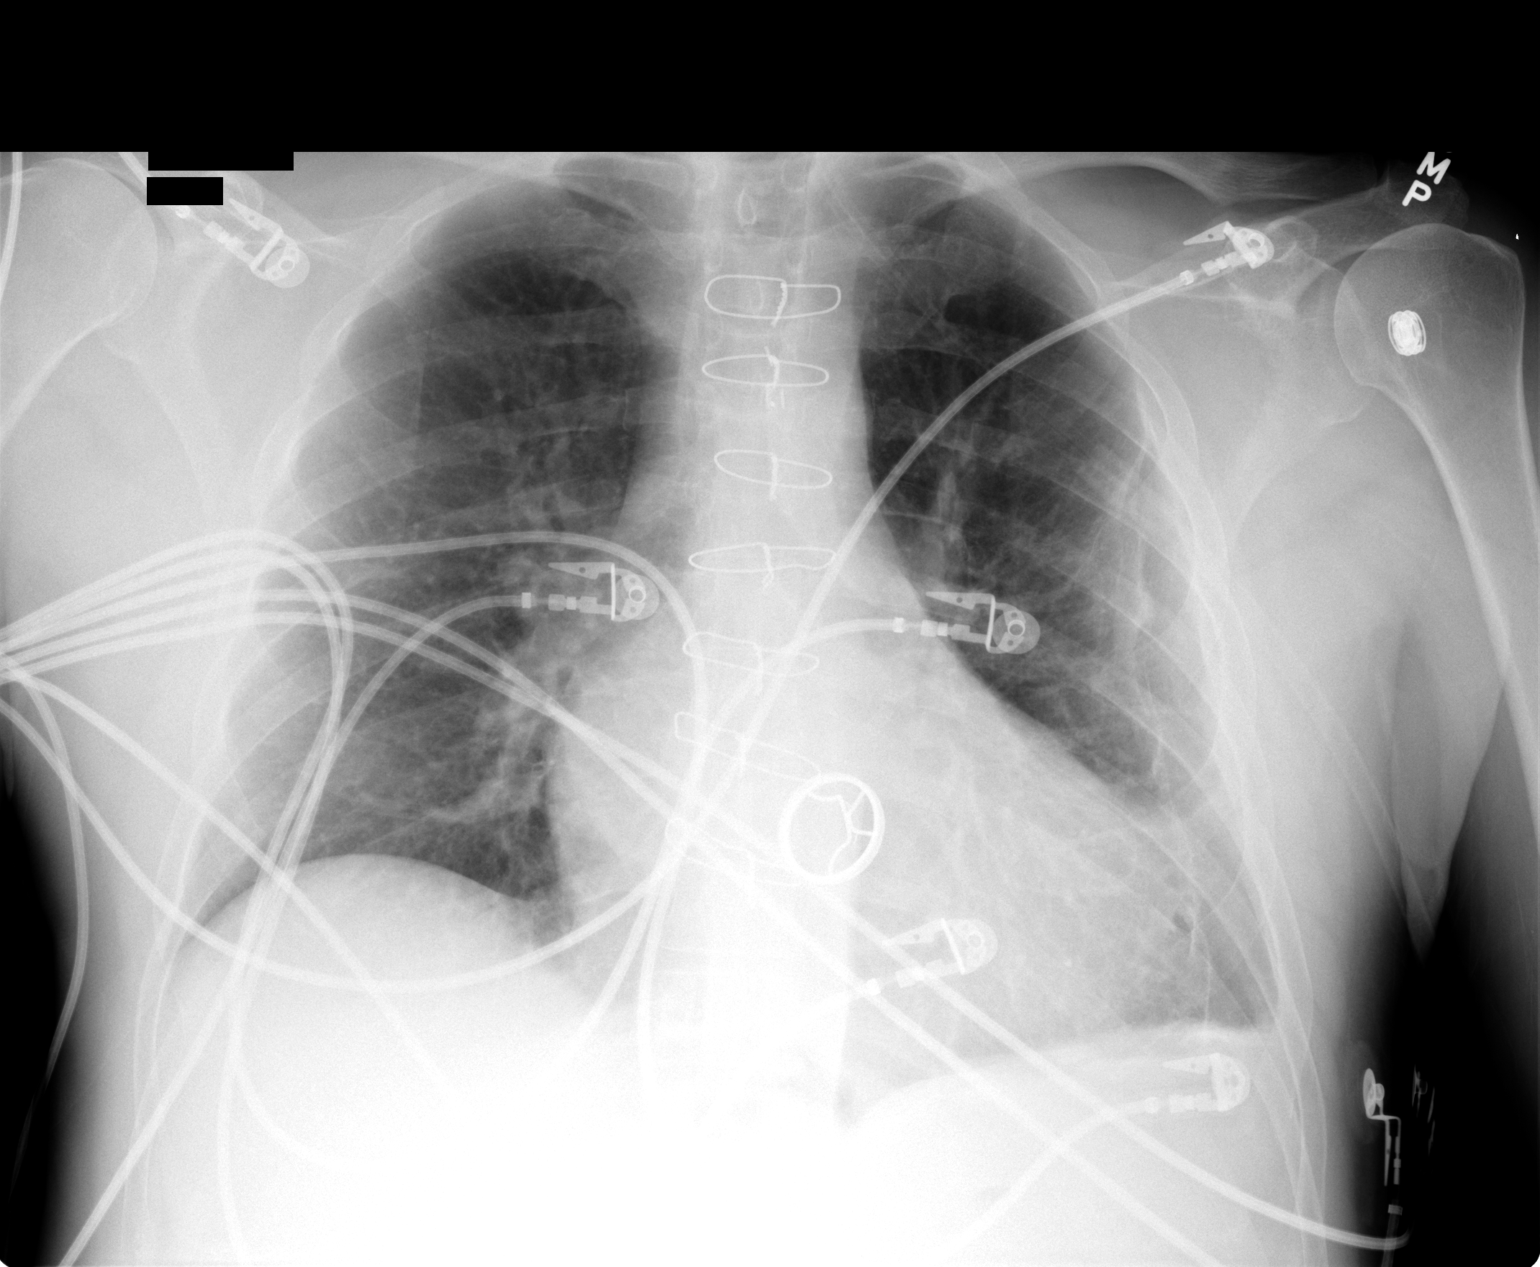

[1 of 1 positions shown; findings below may reference images not displayed]

FINDINGS: The heart is enlarged but stable. Changes of aortic valve replacement
are noted. The lungs are clear. Pulmonary edema present previously has resolved
in the interval. Chronic pleural scarring on the left with calcifications in the
left pleura are again noted.
IMPRESSION: Cardiomegaly. No evidence of acute disease. Fibrothorax involving the left
pleural space.

## 2005-05-17 IMAGING — CT CT PELVIS W/ CM
1 of 3 series · 14 of 32 positions shown, 19 images · IV contrast (omnipaque)
Comparison: Prior CT of the abdomen of 07/08/04.

CLINICAL DATA: Abdominal pain, particularly right lower quadrant.
TECHNIQUE: Multidetector helical scans through the abdomen were performed after oral Raymond IV contrast media were given.  120 cc of Omnipaque 300 were given as the contrast media.

[Series 2: routine abdomen · axial · 0.73mm/px · z∈[-487,-107]mm · 14 of 85 slices shown, 19 images]
[im 5/85  soft-tissue]
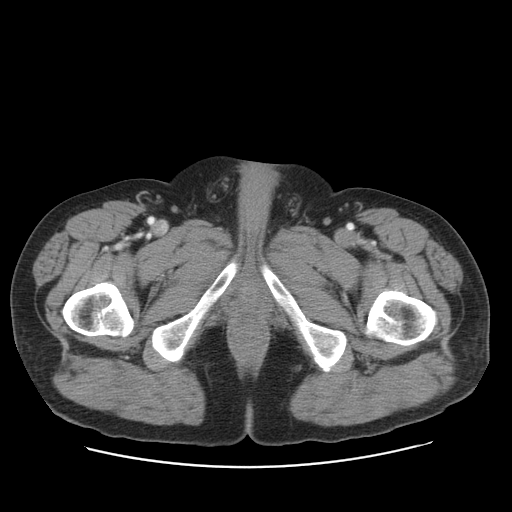
[im 5/85  bone]
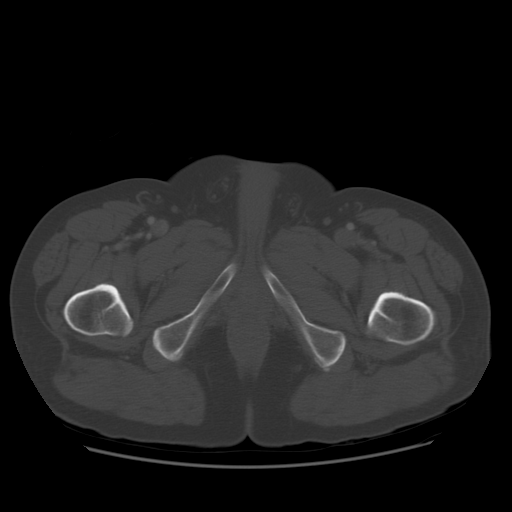
[im 13/85  soft-tissue]
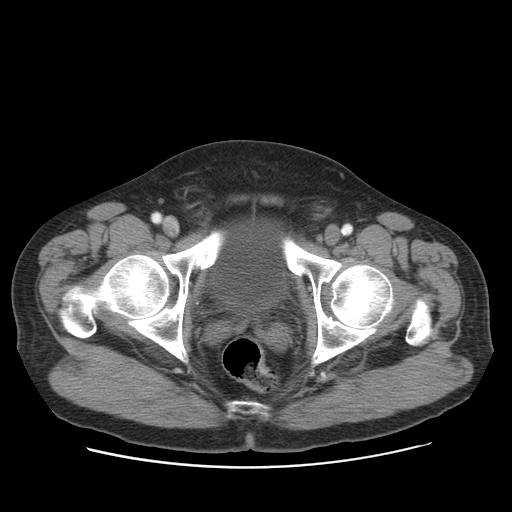
[im 17/85  soft-tissue]
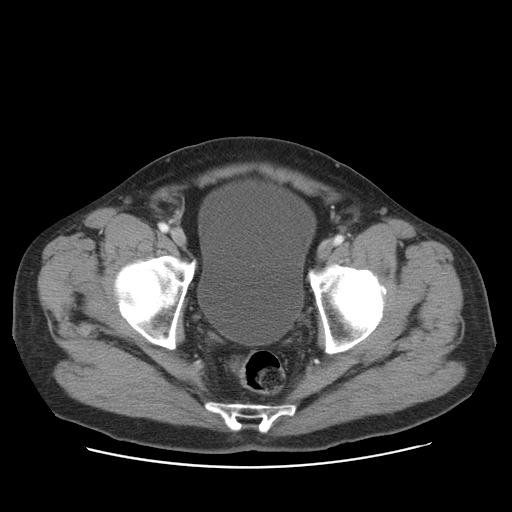
[im 26/85  soft-tissue]
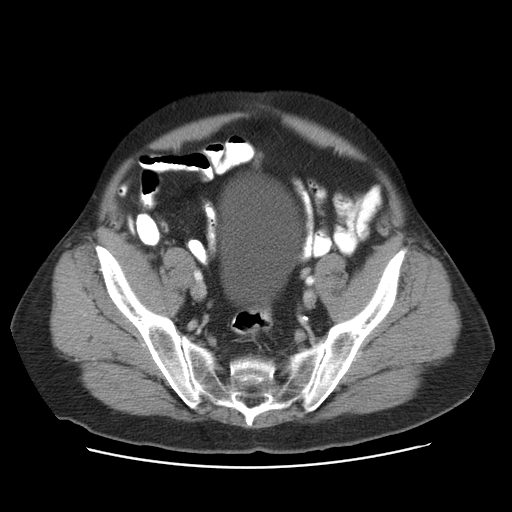
[im 30/85  soft-tissue]
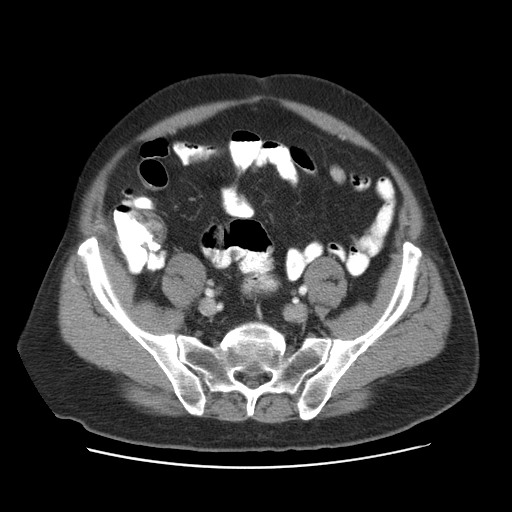
[im 38/85  soft-tissue]
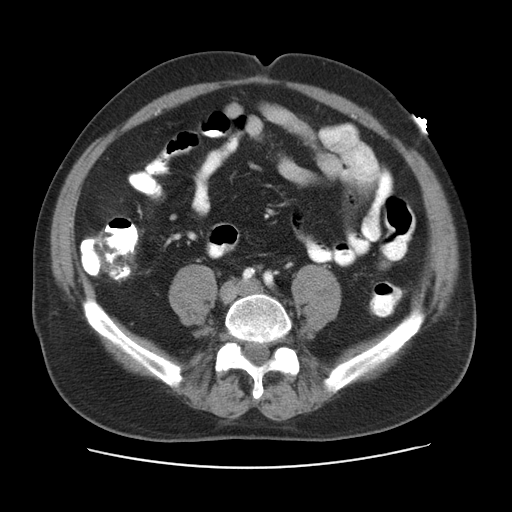
[im 43/85  soft-tissue]
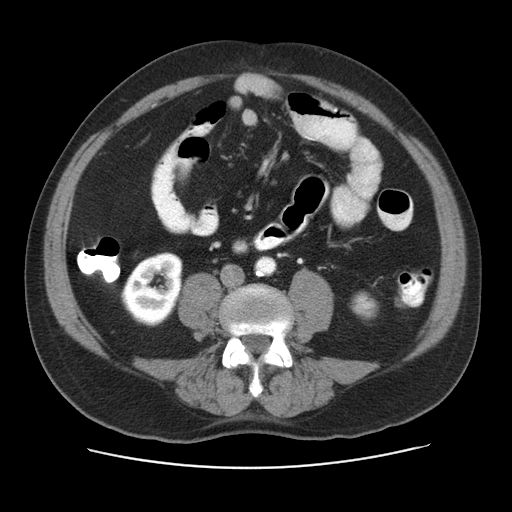
[im 47/85  soft-tissue]
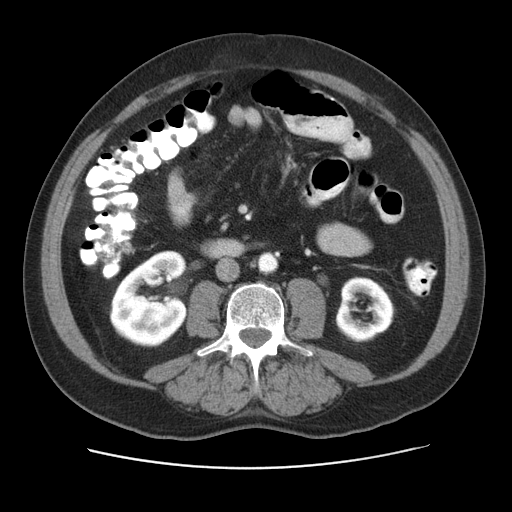
[im 55/85  soft-tissue]
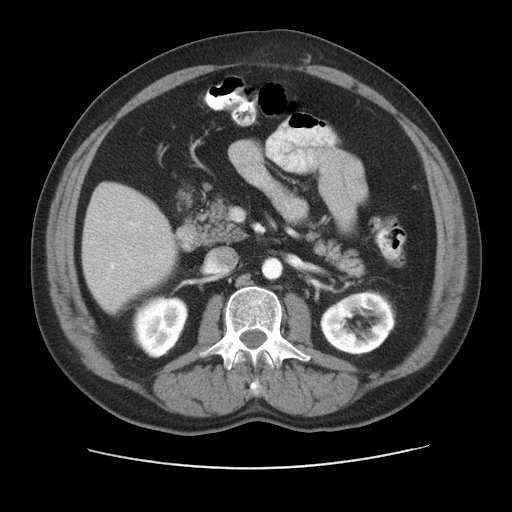
[im 55/85  bone]
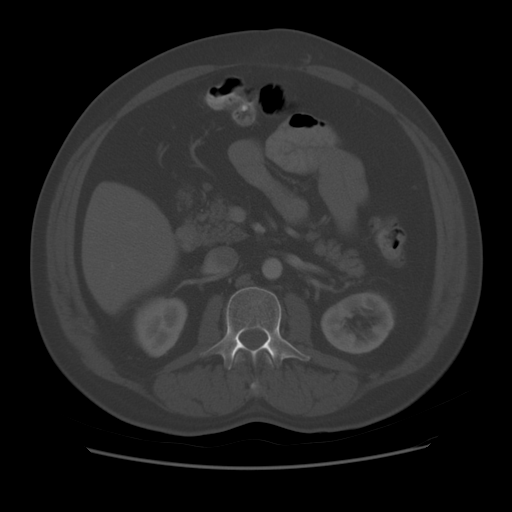
[im 59/85  soft-tissue]
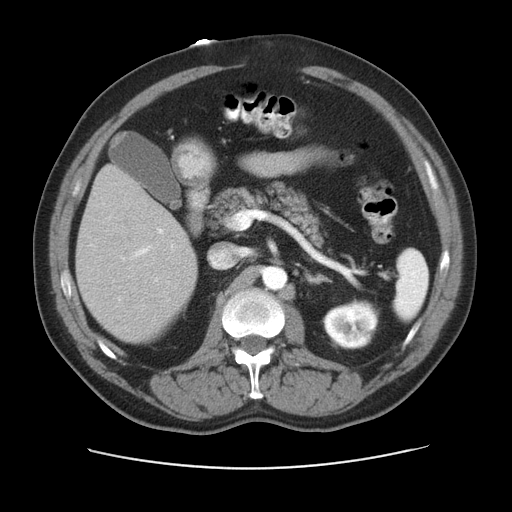
[im 68/85  soft-tissue]
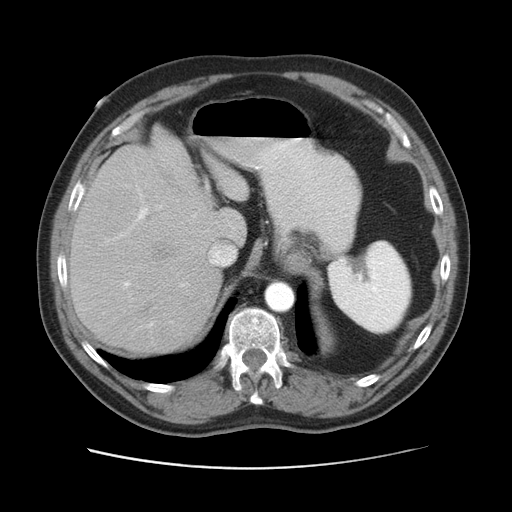
[im 68/85  lung]
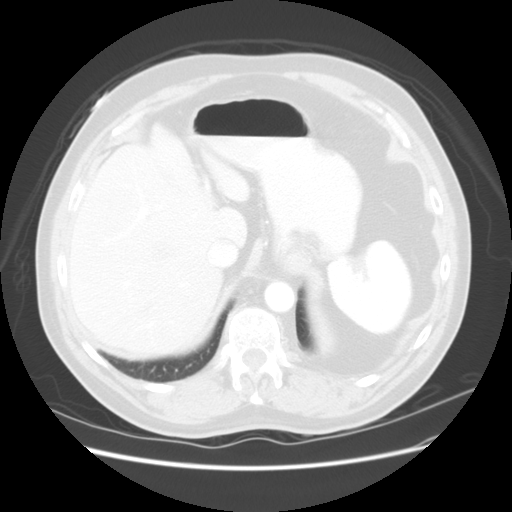
[im 72/85  soft-tissue]
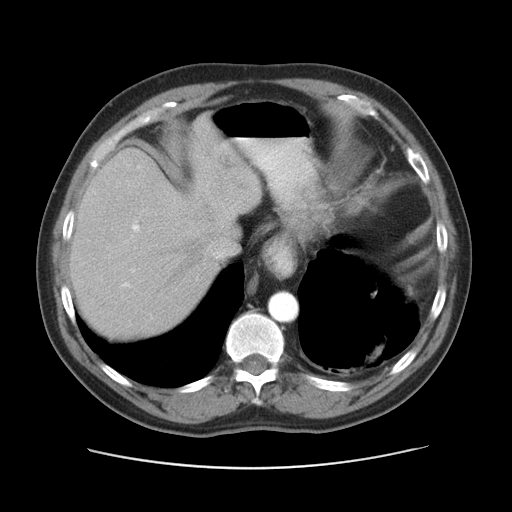
[im 72/85  lung]
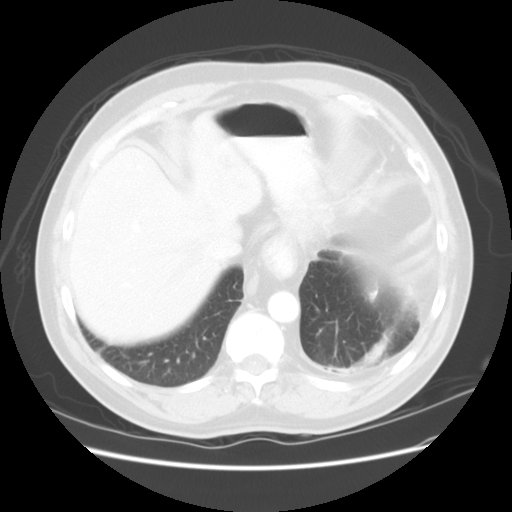
[im 76/85  lung]
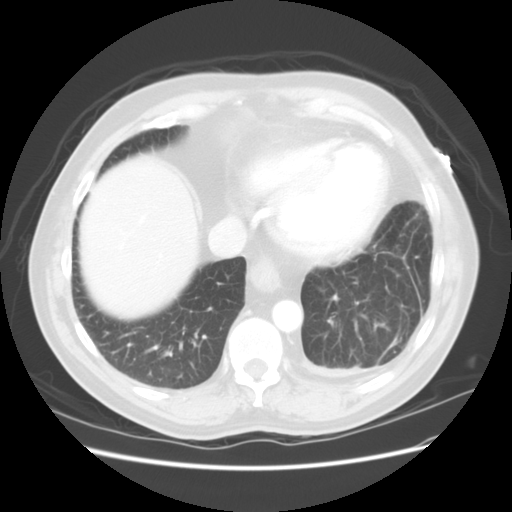
[im 80/85  soft-tissue]
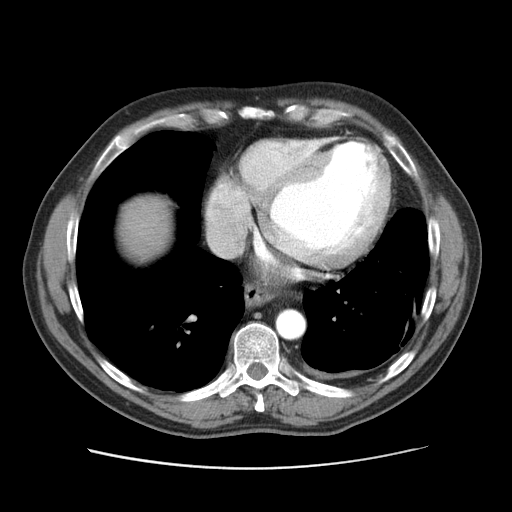
[im 80/85  lung]
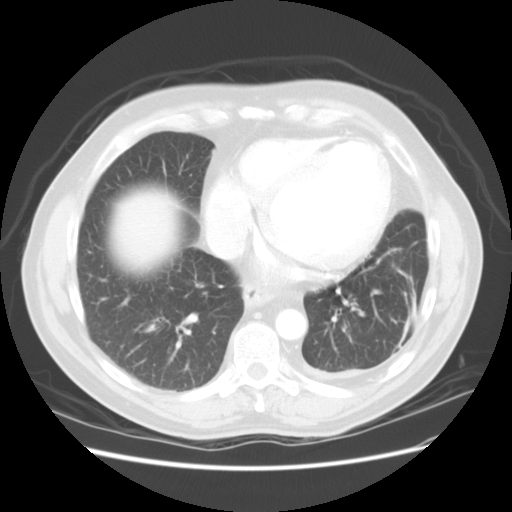

[14 of 32 positions shown; findings below may reference images not displayed]

ABDOMEN CT SCAN WITH CONTRAST:
Mild left basilar atelectasis or scarring is noted. The liver enhances normally with no focal abnormality and no ductal dilatation is seen.  A single small gallstone layers in the neck of the gallbladder.  There is some thickening of the fundus of the gallbladder probably due to a phrygian cap, and this appearance is unchanged compared to prior CT(s).  The pancreas appears fatty infiltrated.  The adrenal glands appear normal.  There is a low attenuation lesion, which is new within the spleen medially and is rather wedge-shaped.  This is most consistent with a focal splenic infarct.  No ascites is seen.  The kidneys enhance normally and on delayed images the pelvocaliceal systems appear normal.  No adenopathy is seen.  The abdominal aorta is normal in caliber.
IMPRESSION: 1.  New low attenuation wedge-shaped lesion in the medial aspect of the spleen most consistent with splenic infarct extending superiorly.  
2.  Small gallstone. 
3.  No change in ventral hernia containing only fat.  
PELVIS CT WITH CONTRAST:
Scans were continued through the pelvis after oral and IV contrast media were given. The appendix fills with contrast and appears normal being very anteriorly positioned.  The urinary bladder is very urine distended.  No free fluid is seen.  The prostate is within normal limits in size for age.  No fluid or mass is seen within the pelvis.
IMPRESSION: 1.  No acute abnormality on CT of the pelvis.  Appendix is normal.  Bladder is urine distended.

## 2005-05-21 IMAGING — CR DG CHEST 2V
2 series · 2 of 2 positions shown · non-contrast
Comparison: none

CLINICAL DATA: Post pacer.  Chest soreness.  Atrial flutter.
 CHEST X-RAY:
 Two views of the chest are compared to a chest x-ray of 08/07/04.  In the interval, a pacer has been placed with atrial and ventricular leads present.  No pneumothorax is seen.  Chronic changes on the left remain.  The heart is mildly enlarged.

[view not recorded (1 of 2)]
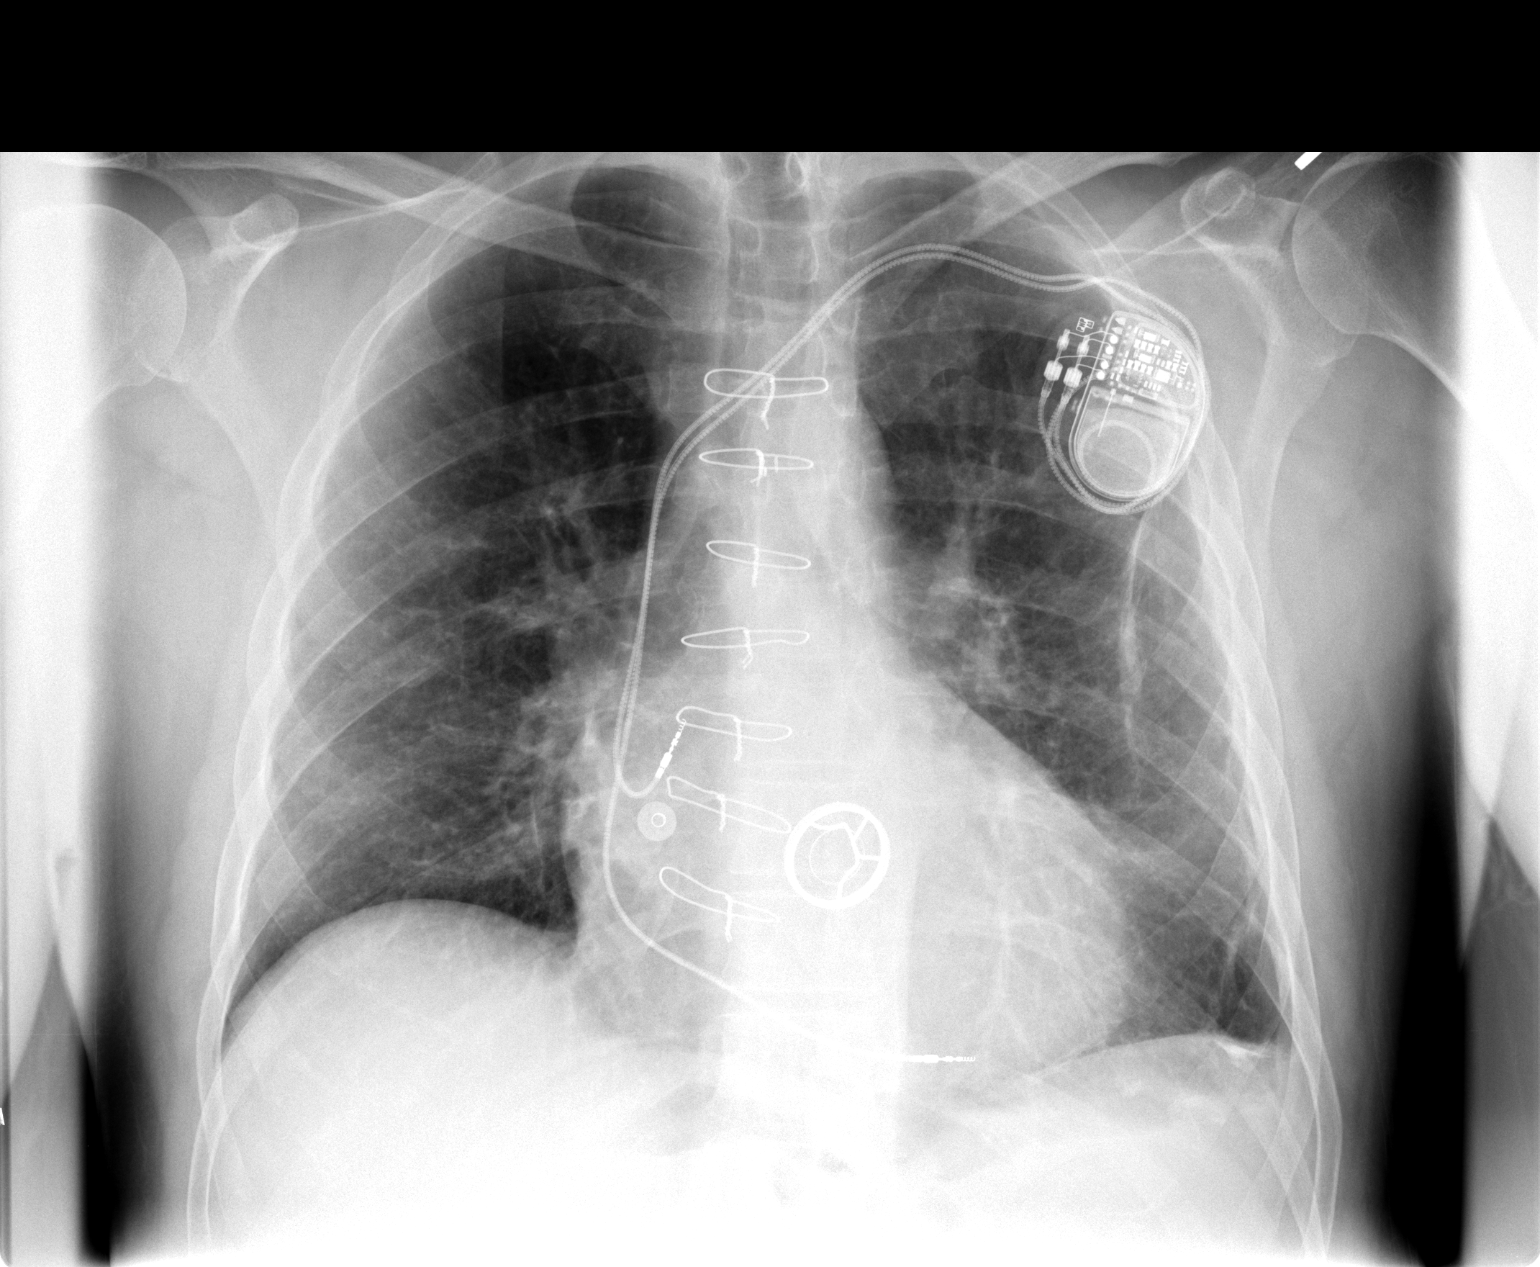

[view not recorded (2 of 2)]
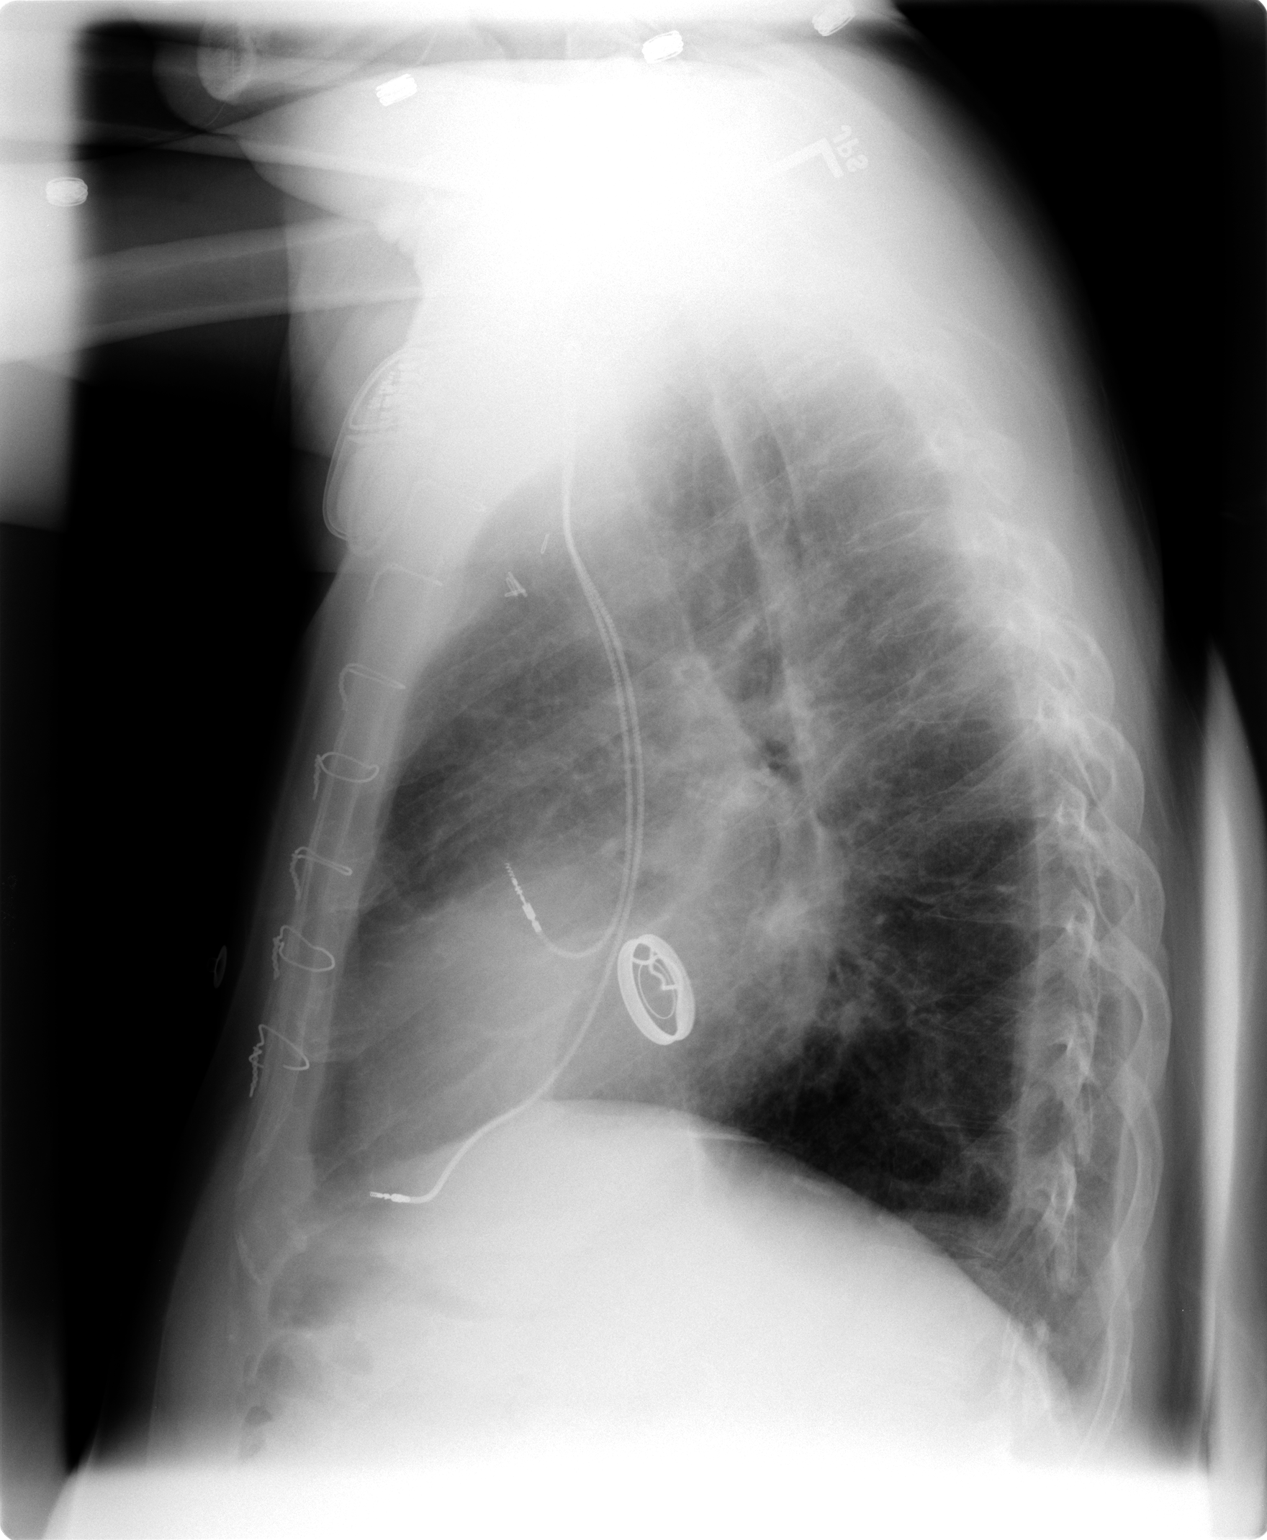

[2 of 2 positions shown; findings below may reference images not displayed]

IMPRESSION: Pacer now present.  No pneumothorax.

## 2005-06-08 HISTORY — PX: AV NODE ABLATION: SHX1209

## 2005-06-10 ENCOUNTER — Emergency Department (HOSPITAL_COMMUNITY): Admission: EM | Admit: 2005-06-10 | Discharge: 2005-06-10 | Payer: Self-pay | Admitting: *Deleted

## 2005-07-07 ENCOUNTER — Inpatient Hospital Stay (HOSPITAL_COMMUNITY): Admission: EM | Admit: 2005-07-07 | Discharge: 2005-07-10 | Payer: Self-pay | Admitting: Emergency Medicine

## 2005-09-08 IMAGING — CR DG CHEST 1V PORT
1 series · 1 of 1 positions shown · non-contrast
Comparison: 08/13/04.

CLINICAL DATA: Chest pain, coronary artery disease, smoking history.
 78321-3 VIEW:

[view not recorded]
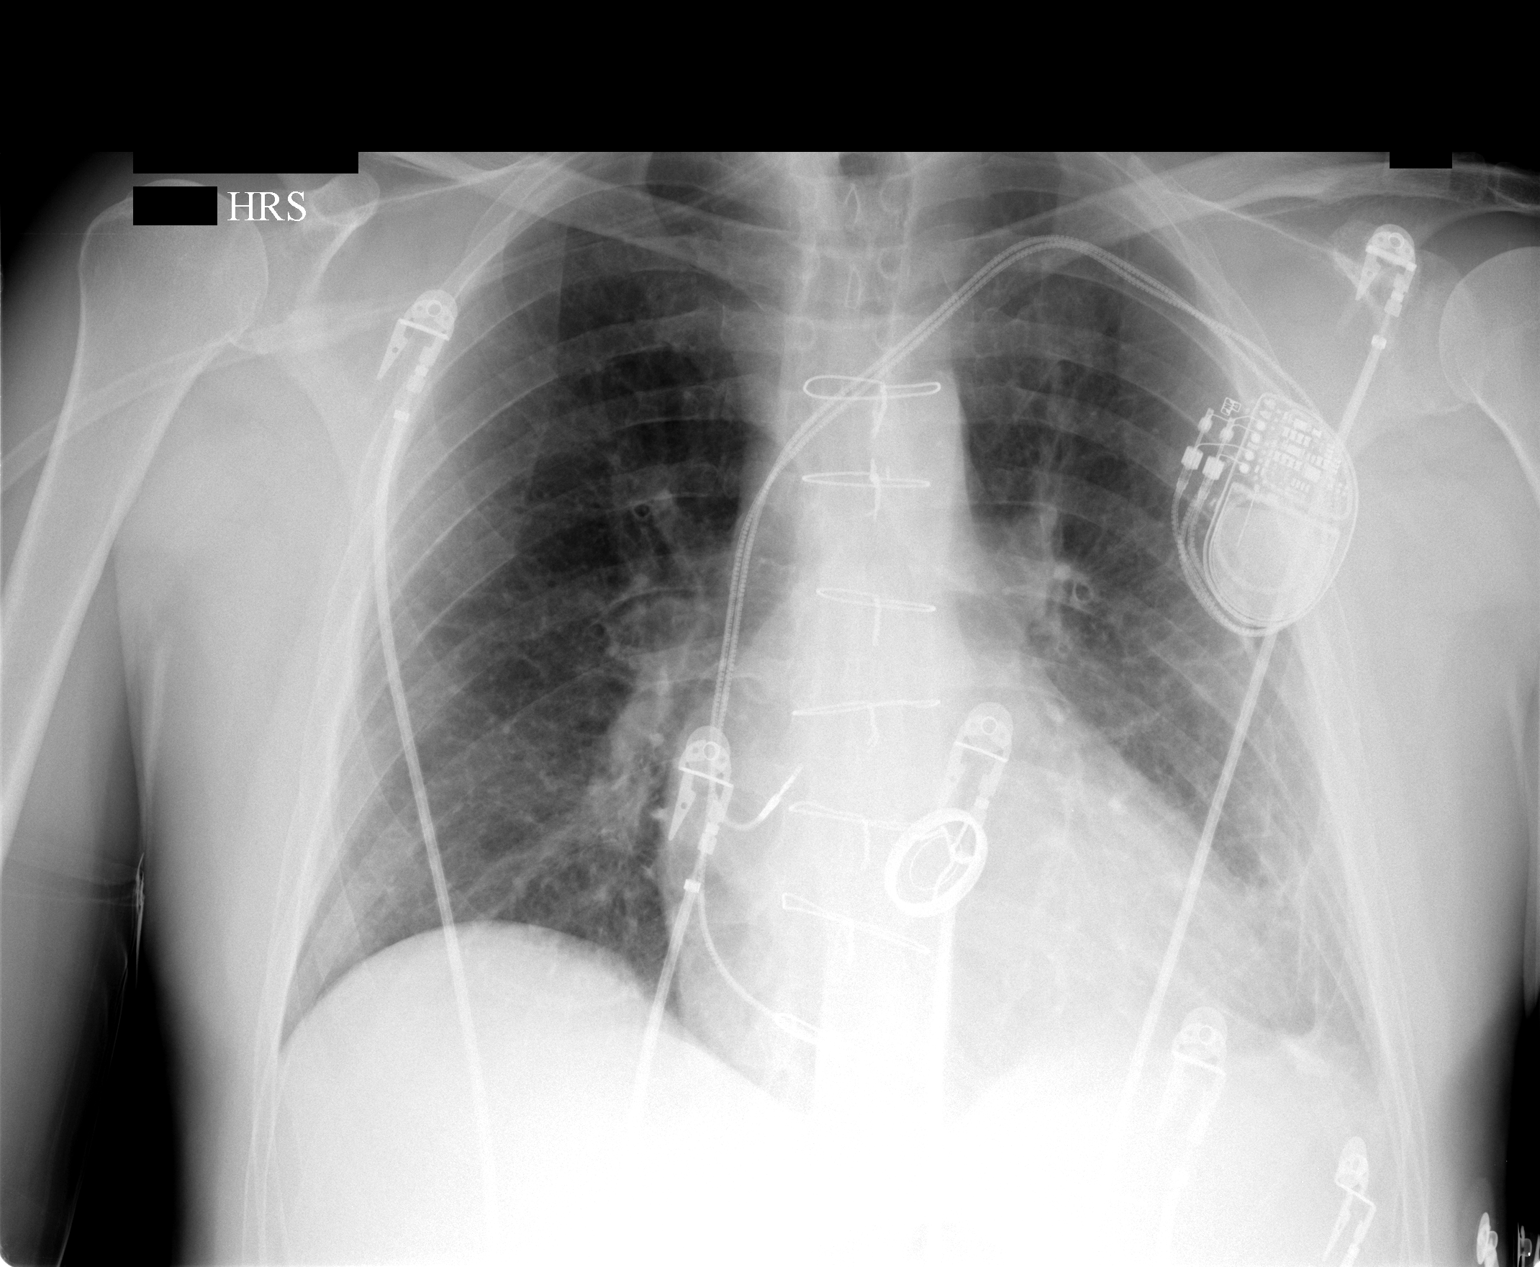

[1 of 1 positions shown; findings below may reference images not displayed]

The patient has had previous median sternotomy.  There is an aortic valve replacement.  Dual lead pacemaker appears the same.  The heart is not enlarged.  There is pulmonary venous hypertension with mild interstitial edema.
IMPRESSION: Pulmonary venous hypertension with mild interstitial edema.

## 2005-09-24 ENCOUNTER — Inpatient Hospital Stay (HOSPITAL_COMMUNITY): Admission: EM | Admit: 2005-09-24 | Discharge: 2005-09-26 | Payer: Self-pay | Admitting: Emergency Medicine

## 2005-10-06 ENCOUNTER — Inpatient Hospital Stay (HOSPITAL_COMMUNITY): Admission: EM | Admit: 2005-10-06 | Discharge: 2005-10-10 | Payer: Self-pay | Admitting: Emergency Medicine

## 2005-10-06 ENCOUNTER — Ambulatory Visit: Payer: Self-pay | Admitting: Internal Medicine
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
## 2005-10-20 IMAGING — CT CT ANGIO CHEST
1 series · 19 of 32 positions shown · IV contrast (APPLIED)
Comparison: None.

CLINICAL DATA: Right-sided chest pain and SOB for one week. 
 CT ANGIOGRAPHY OF CHEST:
TECHNIQUE: Multidetector CT imaging of the chest was performed during bolus injection of intravenous contrast.  Multiplanar CT angiographic image reconstructions were generated to evaluate the vascular anatomy.
 Contrast:  50 cc Omnipaque 300

[Series 4: pulm embolism 2.0 st · axial · 0.67mm/px · z∈[-231,+41]mm · 19 of 146 slices shown]
[im 5/146  lung]
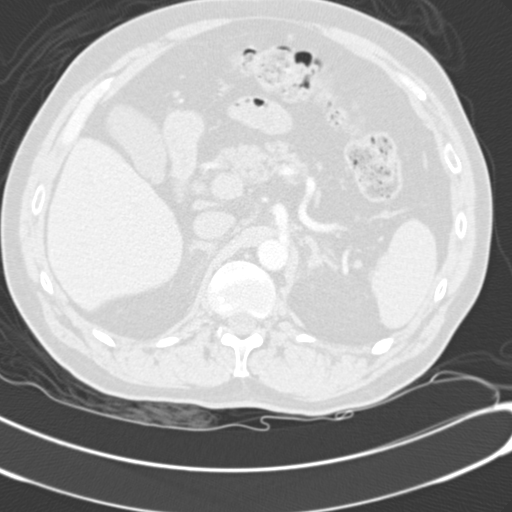
[im 15/146  soft-tissue]
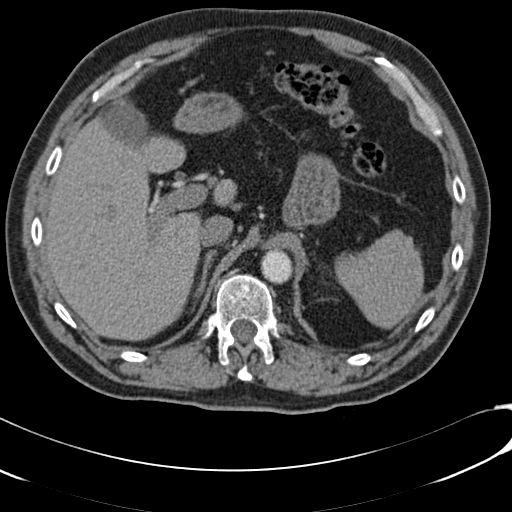
[im 19/146  lung]
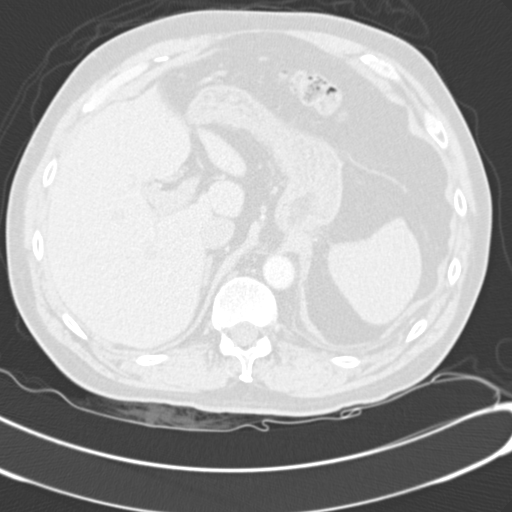
[im 29/146  soft-tissue]
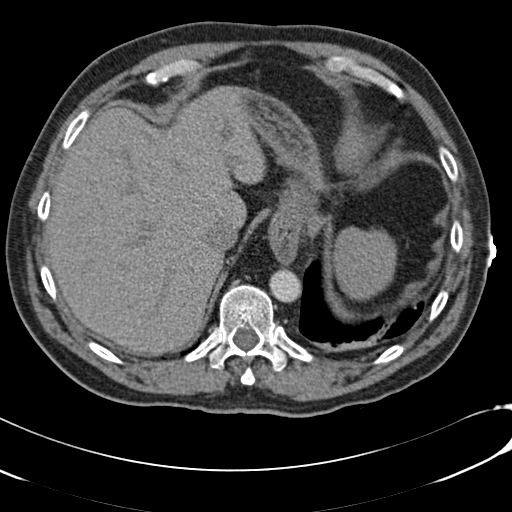
[im 38/146  lung]
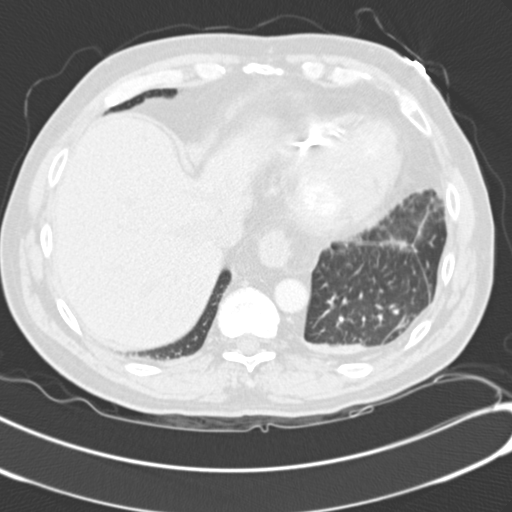
[im 43/146  soft-tissue]
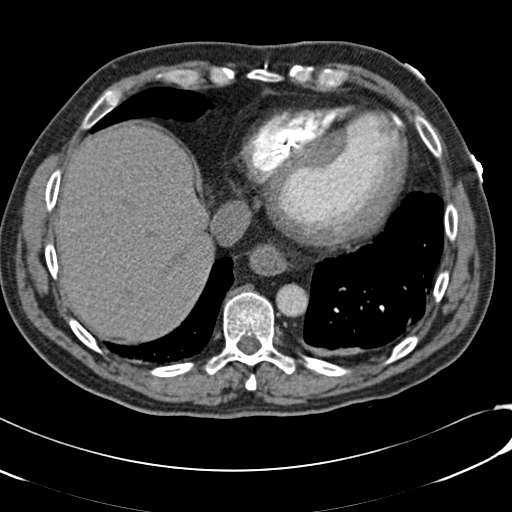
[im 52/146  lung]
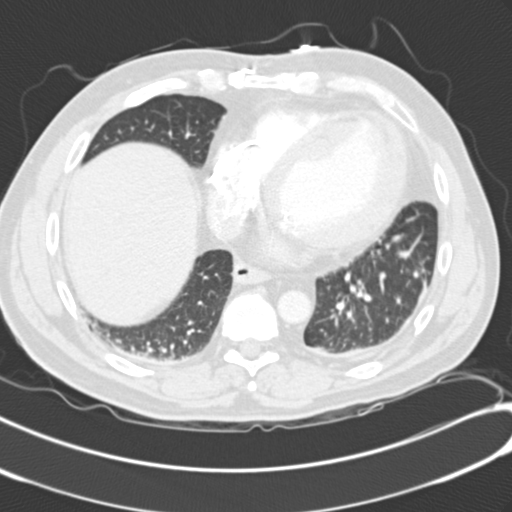
[im 57/146  soft-tissue]
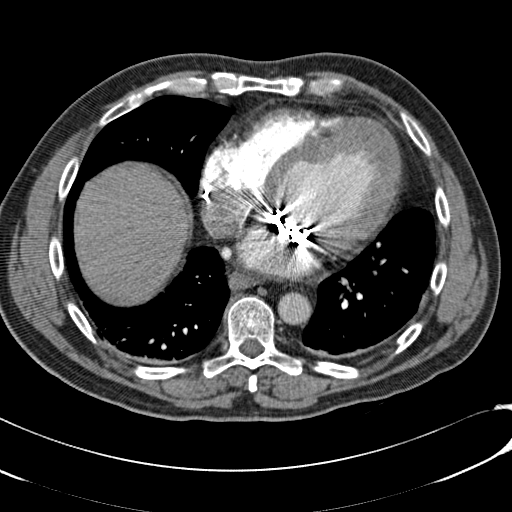
[im 66/146  lung]
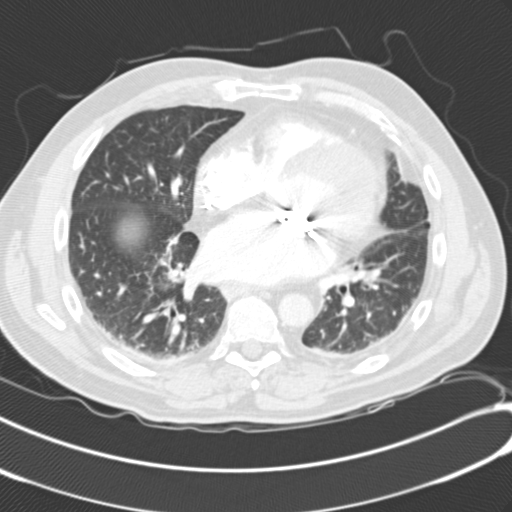
[im 75/146  soft-tissue]
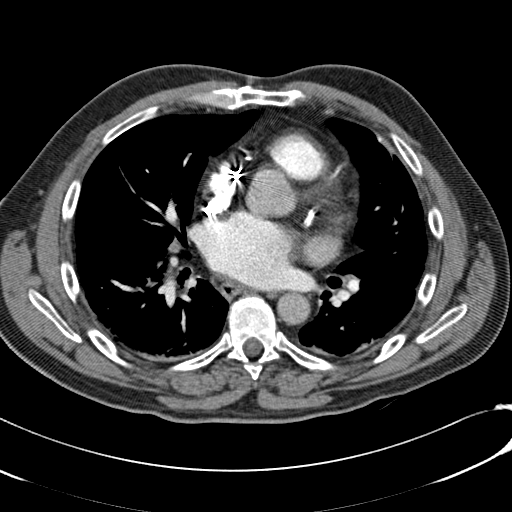
[im 80/146  lung]
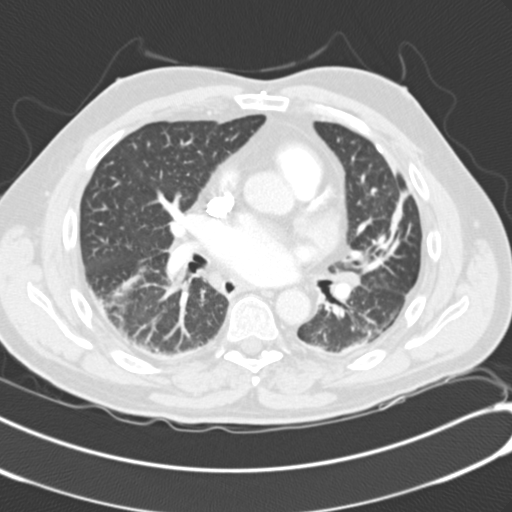
[im 89/146  soft-tissue]
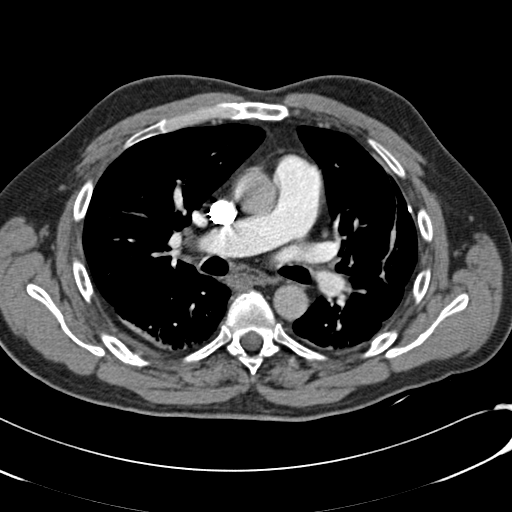
[im 94/146  lung]
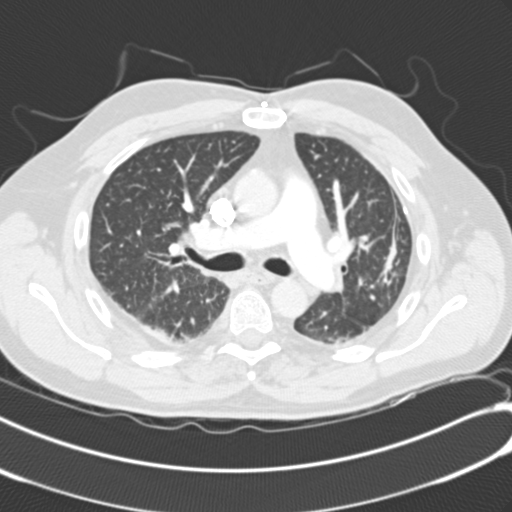
[im 103/146  soft-tissue]
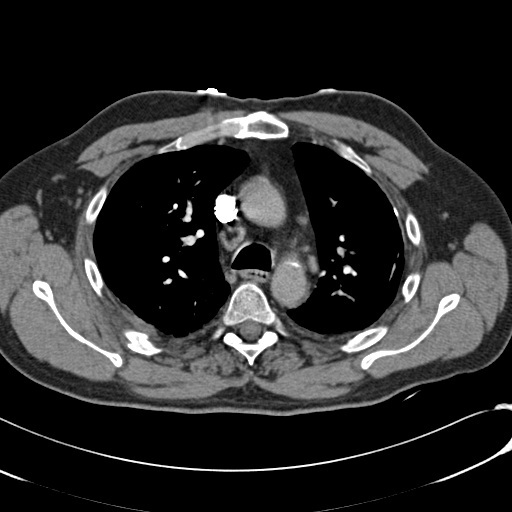
[im 108/146  lung]
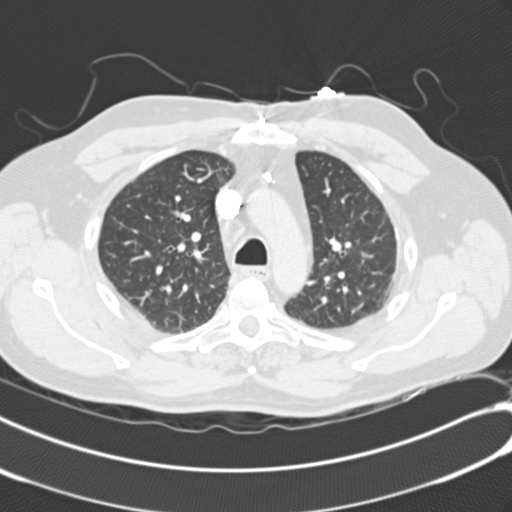
[im 117/146  soft-tissue]
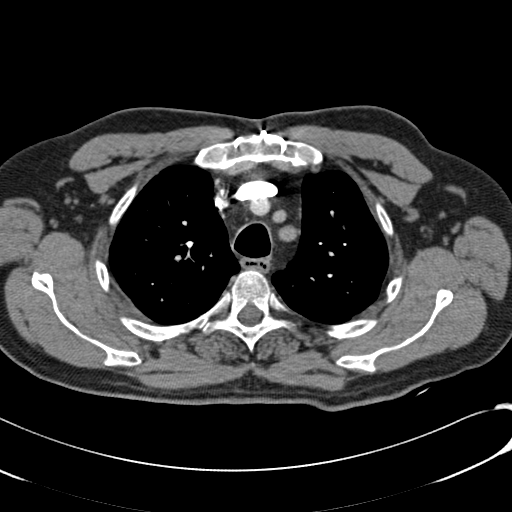
[im 127/146  lung]
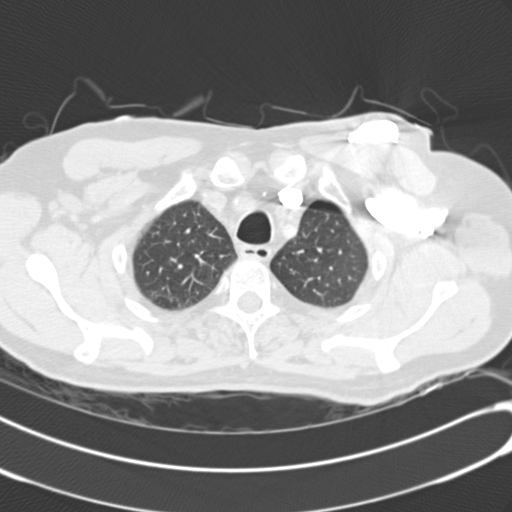
[im 131/146  soft-tissue]
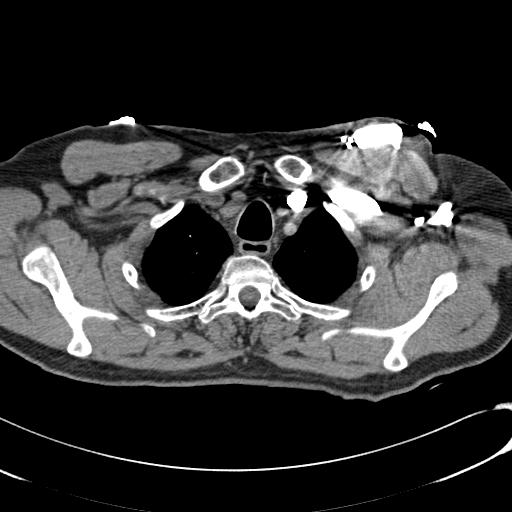
[im 141/146  lung]
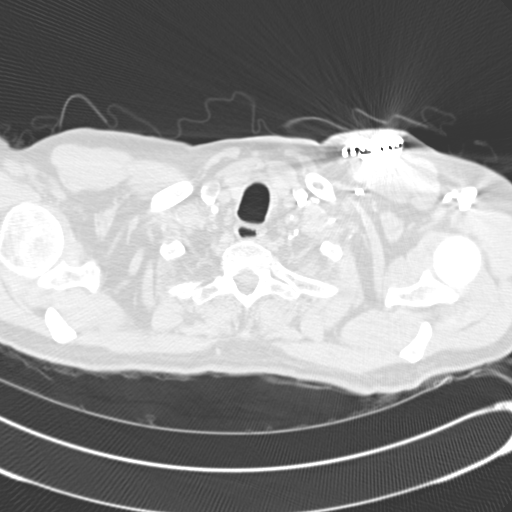

[19 of 32 positions shown; findings below may reference images not displayed]

FINDINGS: No abnormal filling defects within the main pulmonary artery or its branches to suggest acute pulmonary embolus.  
 No pathologically enlarged, axillary, mediastinal, or prevascular lymph nodes.  No pleural or pericardial effusions.  There are scar-like opacities noted at both lung bases as well as a lingular segment of the left lung and left upper lobe.  Tiny pulmonary nodule within the right middle lobe (image 71) measures less than 5 mm.  Finding is nonspecific and may be postinflammatory in etiology.  Follow-up examination in 3 months recommended.  Calcified pleural plaque at left lung base is noted.
IMPRESSION: 1.  No evidence for acute pulmonary embolus.
 2.  Tiny nodule within right middle lobe.  Follow-up in 3 months recommended.
 3.  Bilateral lower lobe and lingular scarring.

## 2005-10-20 IMAGING — CR DG CHEST 2V
2 series · 2 of 2 positions shown · non-contrast
Comparison: 01/03/05.

CLINICAL DATA: Chest pain. 
 CHEST - 2 VIEW:

[view not recorded (1 of 2)]
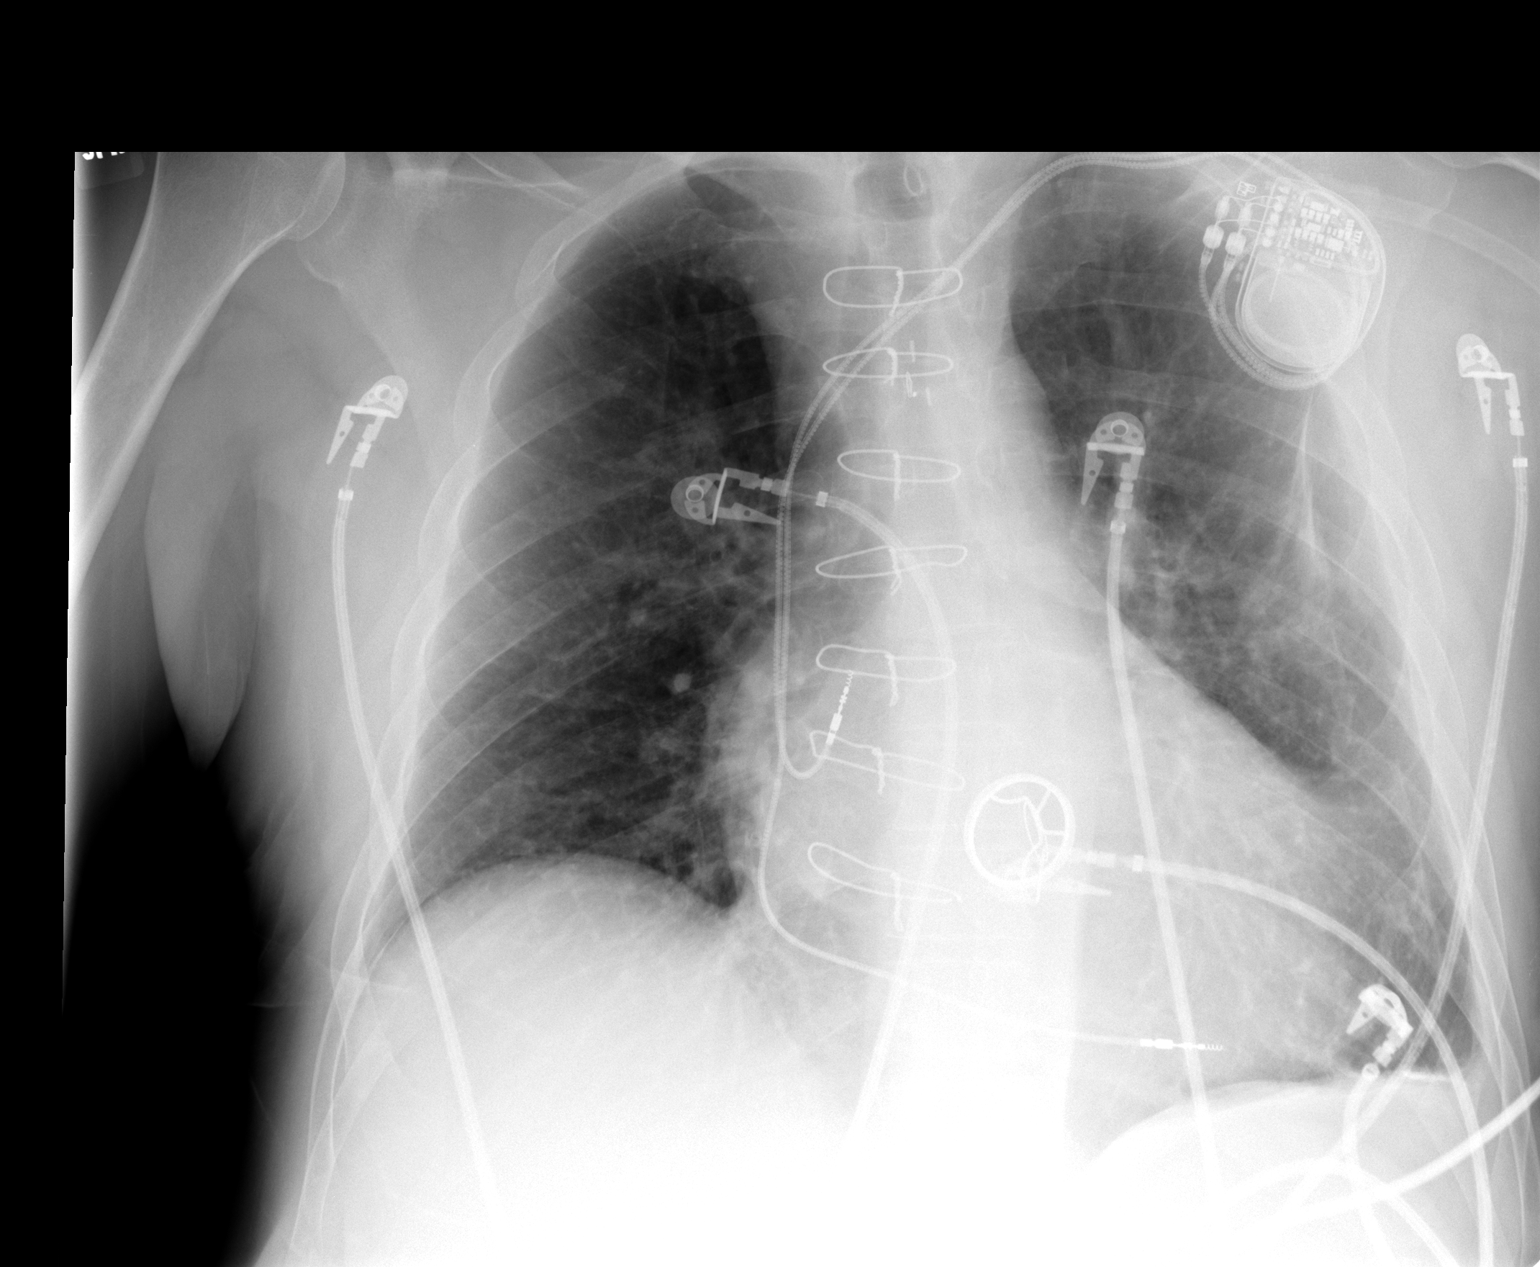

[view not recorded (2 of 2)]
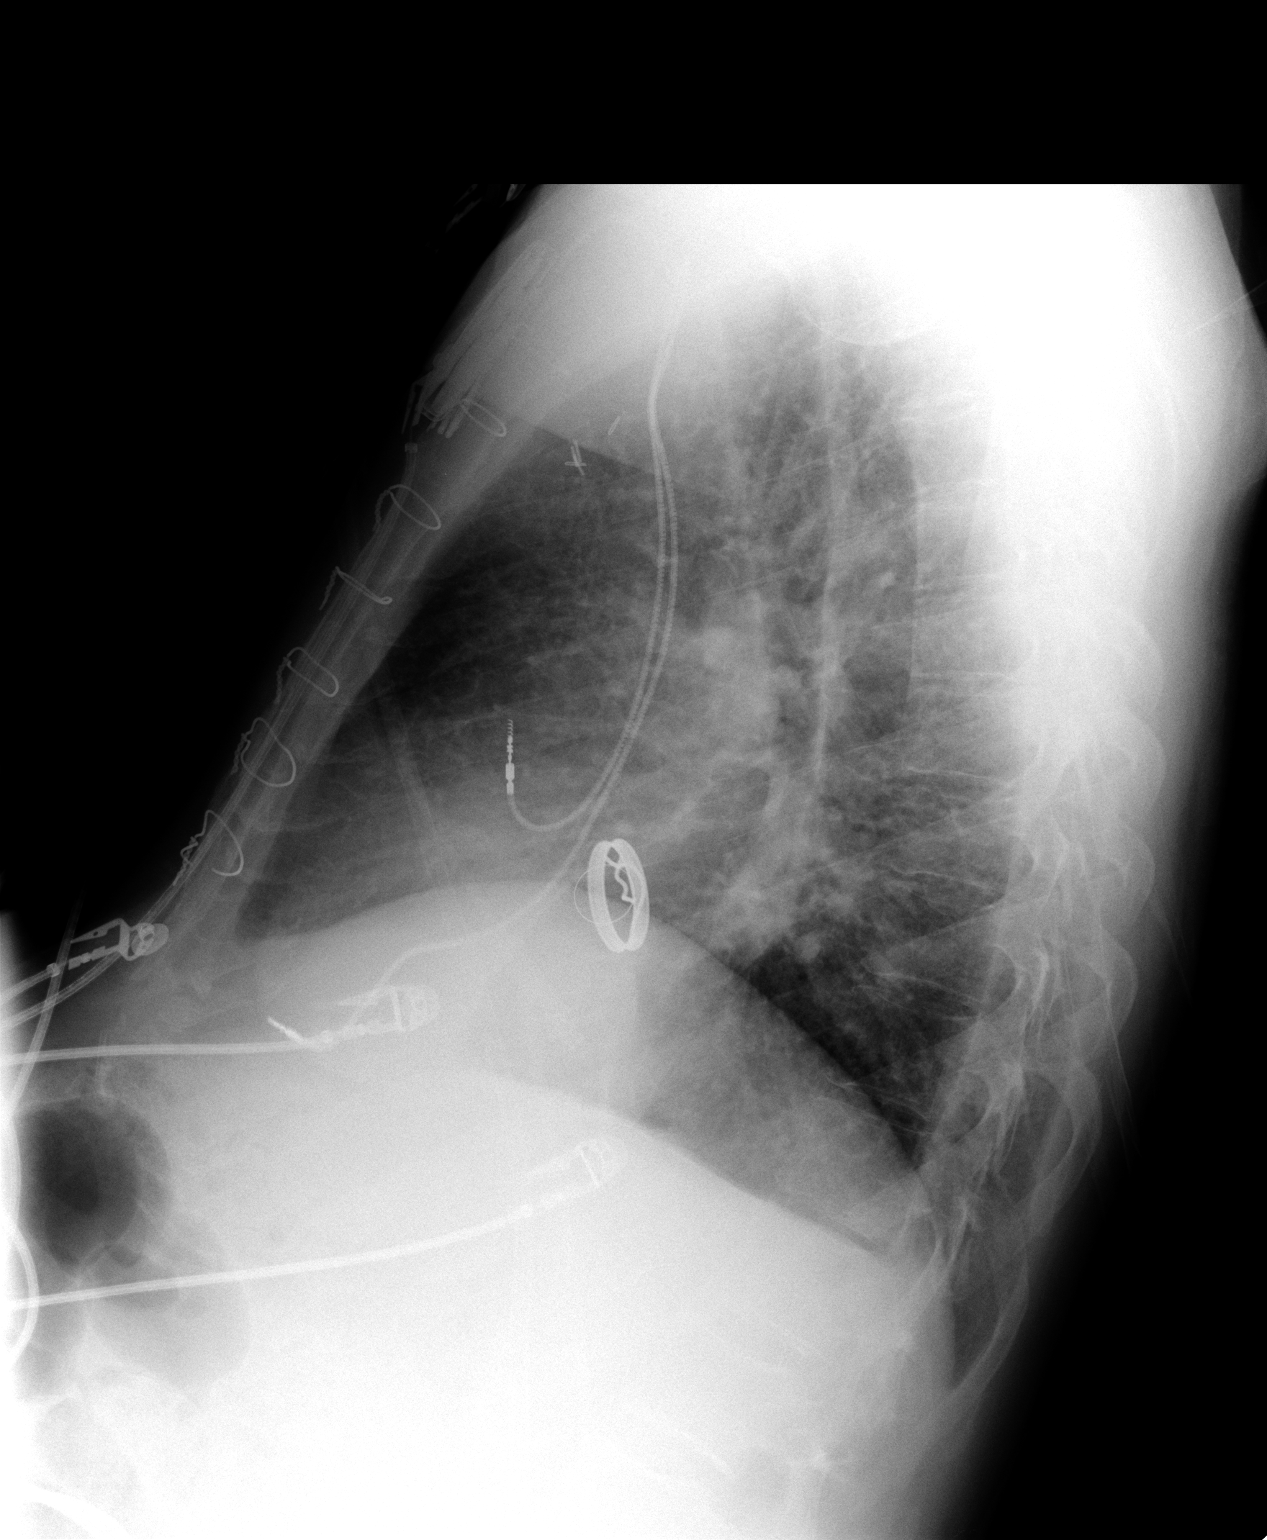

[2 of 2 positions shown; findings below may reference images not displayed]

Heart is enlarged.  Mitral valve replacement is noted.  Dual lead pacemaker is again noted.  There is some scarring in the left upper lobe and to a lesser extent the left lower lobe.  There is no heart failure.  There is a patchy area of infiltrate in the right upper lobe overlying an EKG lead.  This may be an area of pneumonia or edema.  No effusion.  The vascularity is normal.
IMPRESSION: Chronic lung disease and scarring.  Question right upper lobe infiltrate.

## 2005-11-16 ENCOUNTER — Emergency Department (HOSPITAL_COMMUNITY): Admission: EM | Admit: 2005-11-16 | Discharge: 2005-11-17 | Payer: Self-pay | Admitting: Emergency Medicine

## 2005-11-17 ENCOUNTER — Inpatient Hospital Stay (HOSPITAL_COMMUNITY): Admission: AD | Admit: 2005-11-17 | Discharge: 2005-11-20 | Payer: Self-pay | Admitting: Cardiology

## 2005-11-17 ENCOUNTER — Ambulatory Visit: Payer: Self-pay | Admitting: Internal Medicine

## 2005-12-28 IMAGING — CR DG CHEST 2V
2 series · 2 of 2 positions shown · non-contrast
Comparison: 01/12/2005.

CLINICAL DATA: Right-sided chest pain.  Hypertension. Cardiac arrhythmia.  
 CHEST - 2 VIEW:

[w chest pa]
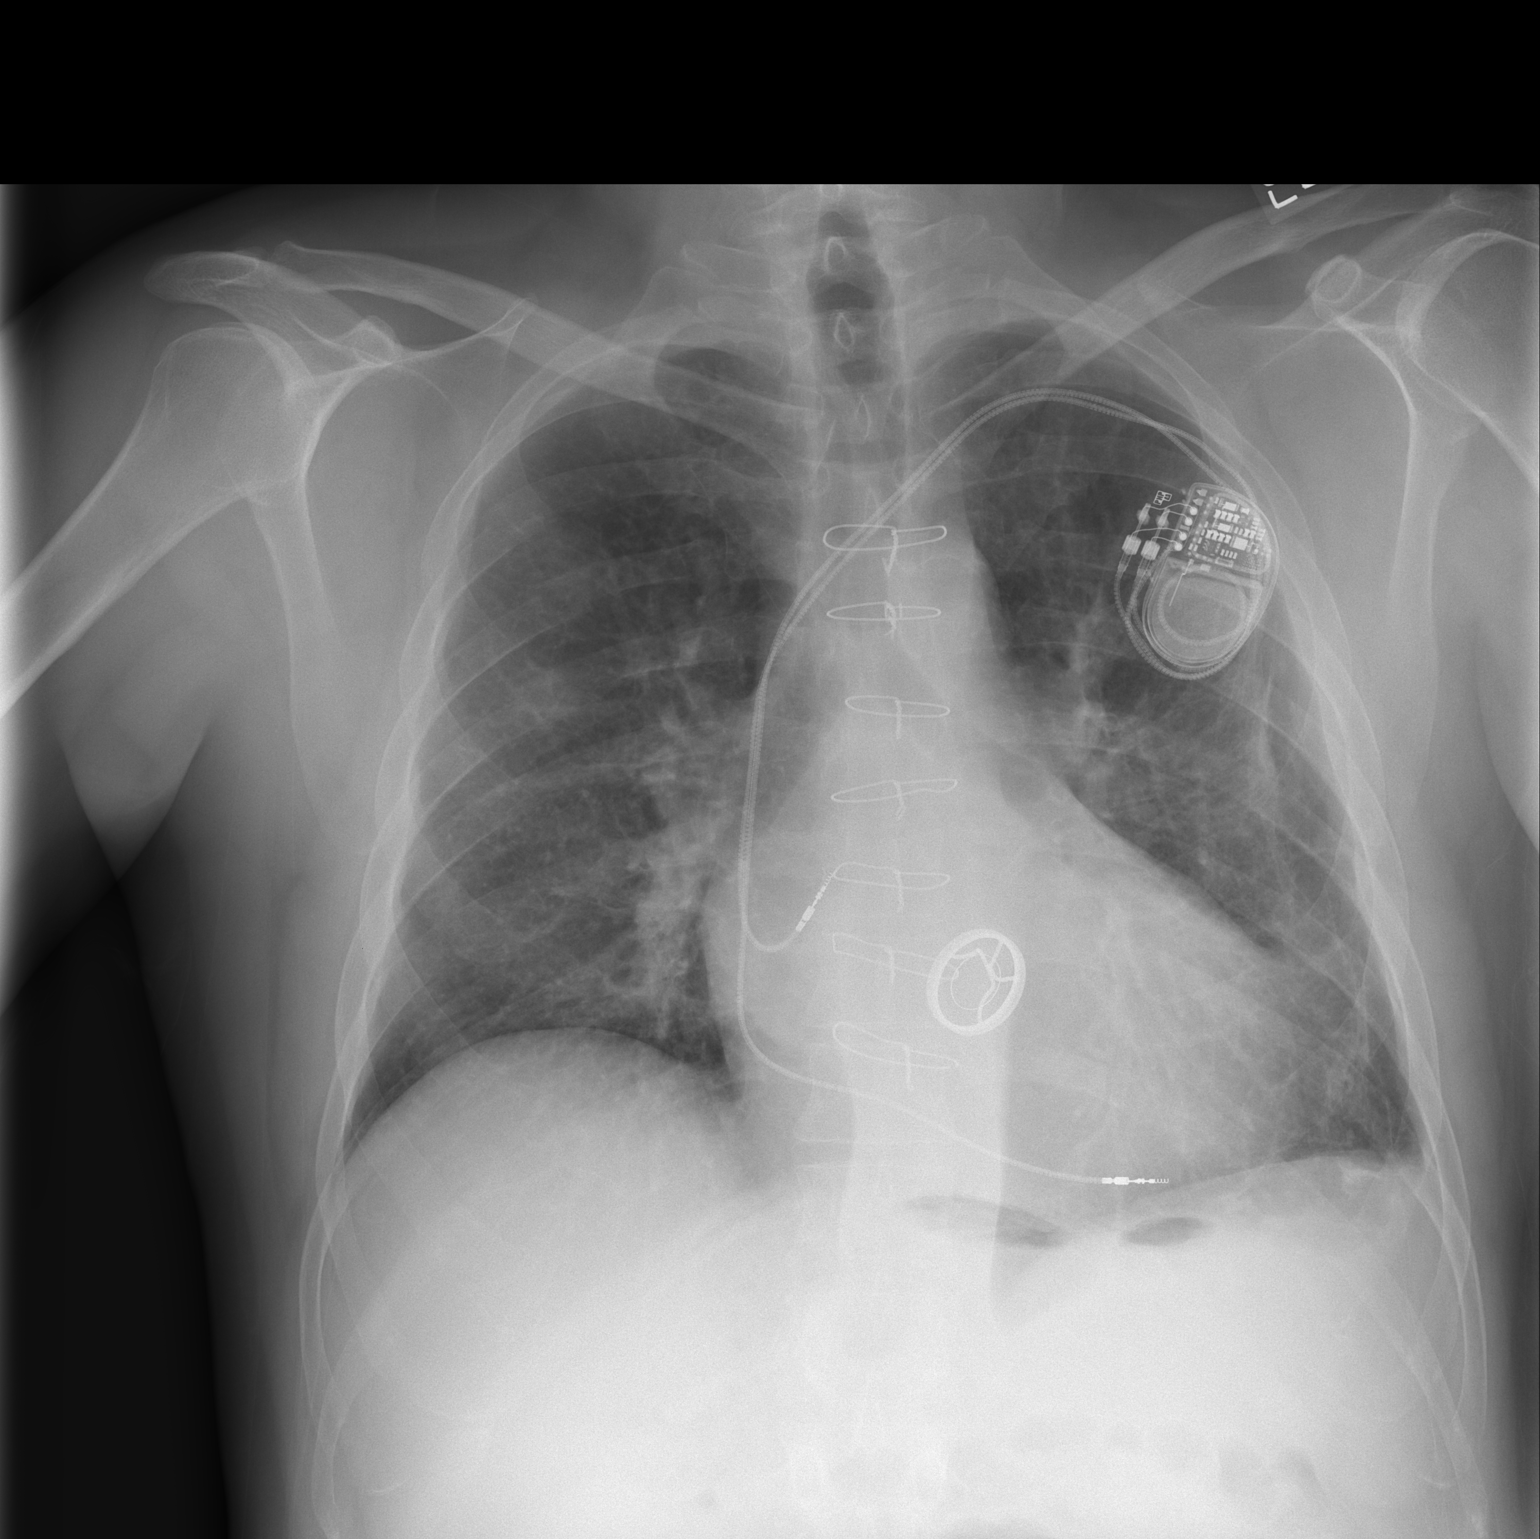

[w chest lat]
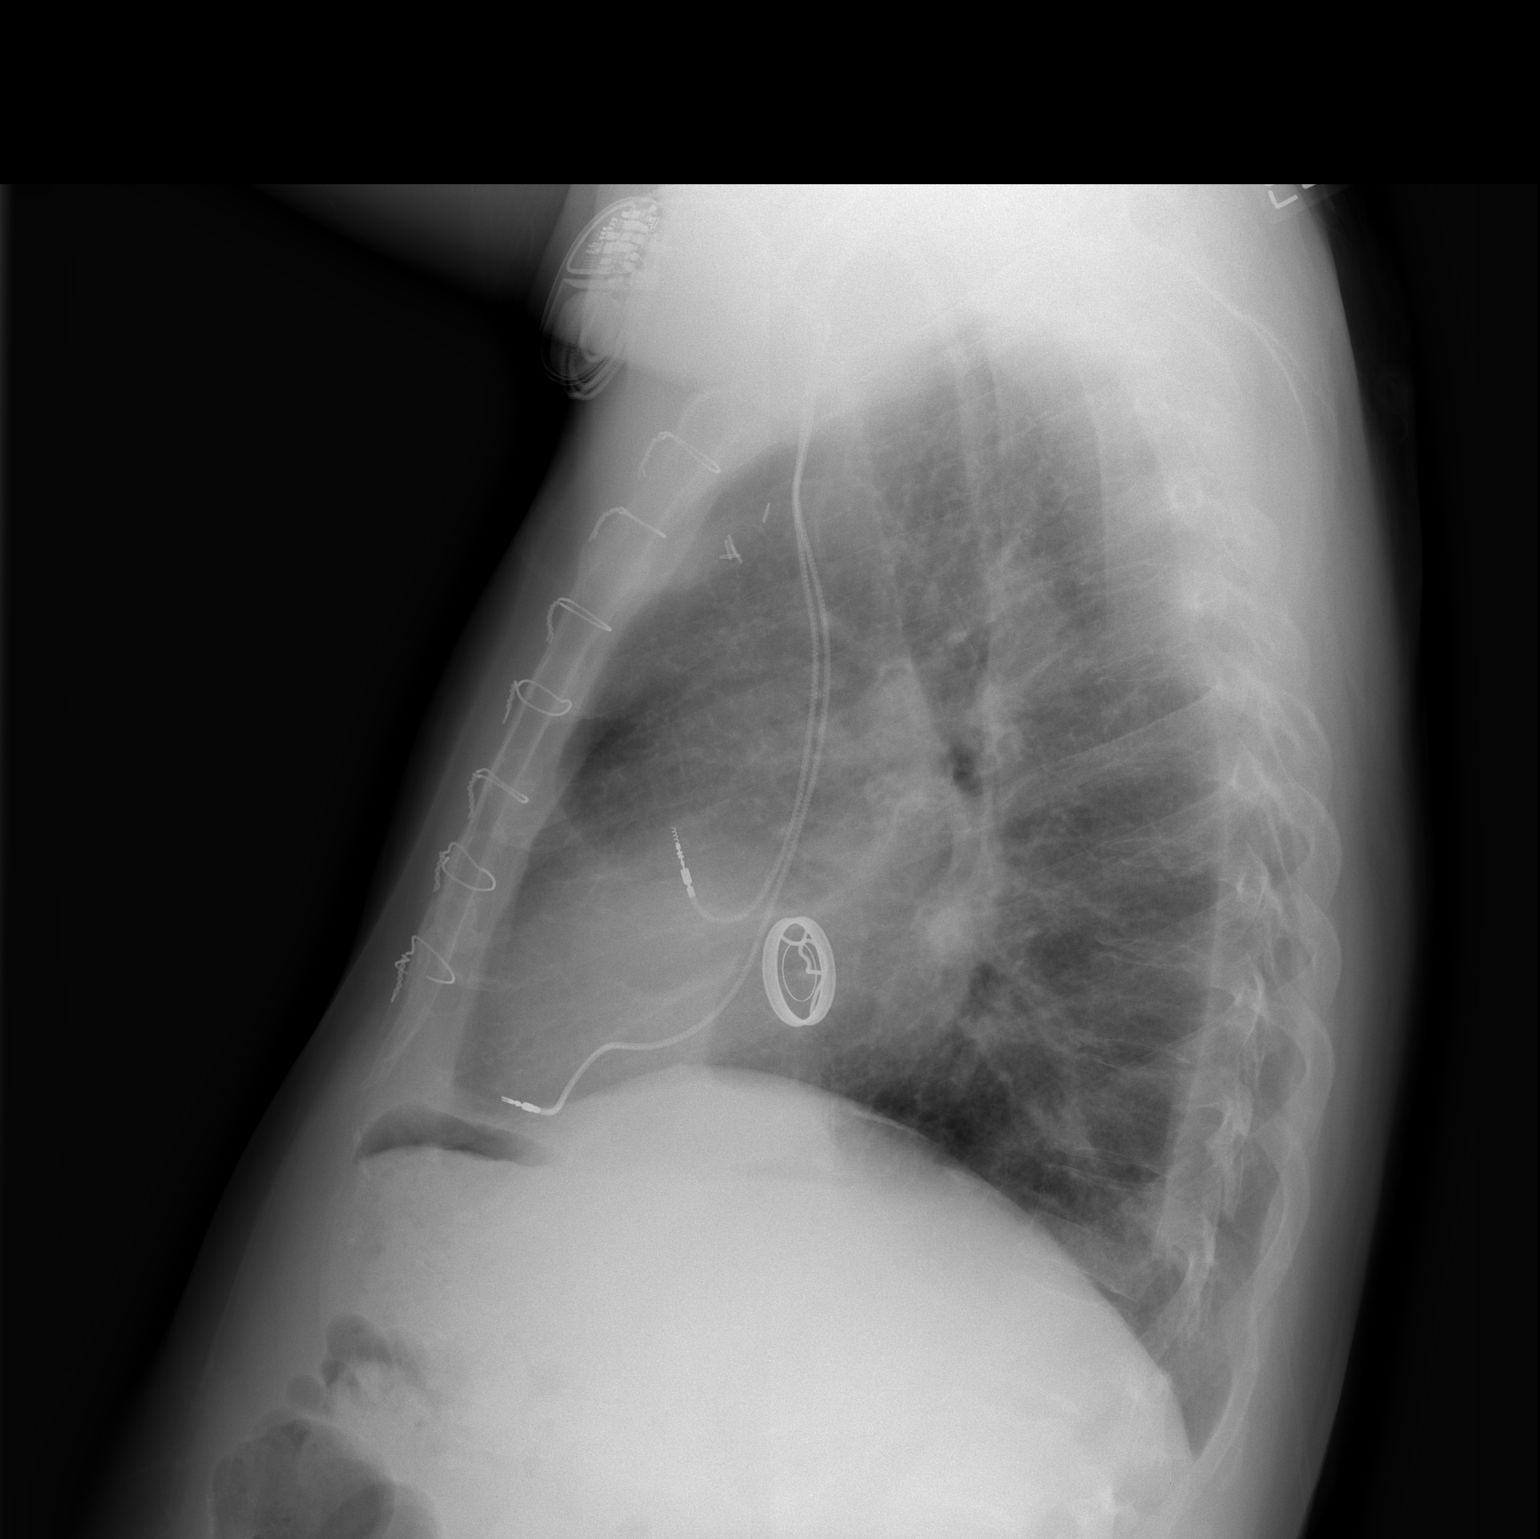

[2 of 2 positions shown; findings below may reference images not displayed]

Mild cardiomegaly is stable. There is no evidence of congestive heart failure.  Left-sided pleural-parenchymal scarring is stable.  Dual lead transvenous pacemaker remains in appropriate position.  
 Prosthetic mitral valve is again noted.
IMPRESSION: Stable mild cardiomegaly and left lung scarring. No acute findings.

## 2006-02-17 IMAGING — CT CT CHEST W/ CM
1 series · 15 of 33 positions shown, 19 images · IV contrast (75CC OMNI 300)
Comparison: [REDACTED] chest x-ray 03/22/05 and [REDACTED] chest CT 01/12/05.

CLINICAL DATA: Abnormal chest x-ray, former smoker--quit [id number], Mitral valve replacement, pacemaker. 
CHEST CT WITH CONTRAST:
TECHNIQUE: Multidetector CT imaging of the chest was performed following the standard protocol during bolus administration of intravenous contrast.
Contrast:  75 cc Omnipaque 300

[Series 2: routine chest · axial · 0.70mm/px · z∈[-262,+13]mm · 15 of 65 slices shown, 19 images]
[im 5/65  mediastinal]
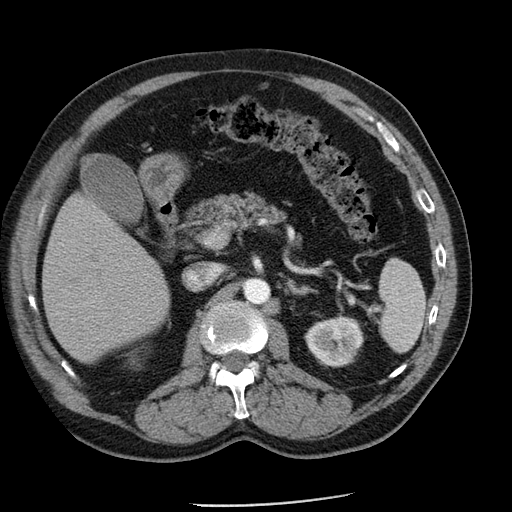
[im 5/65  lung]
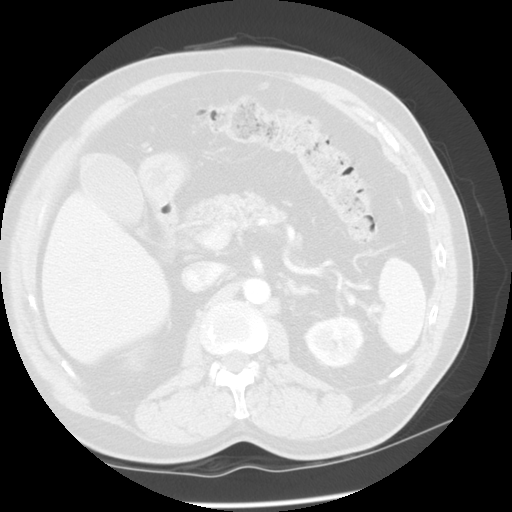
[im 10/65  lung]
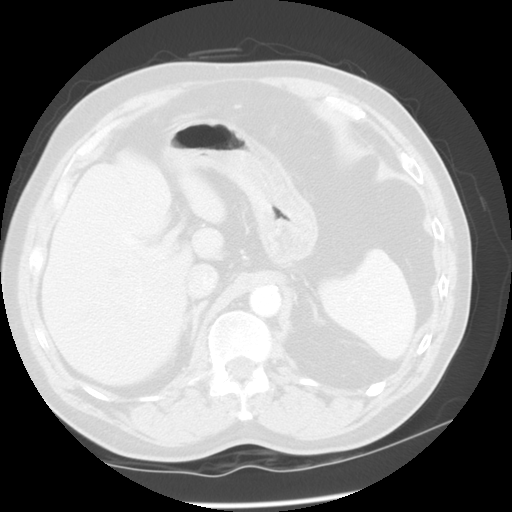
[im 13/65  lung]
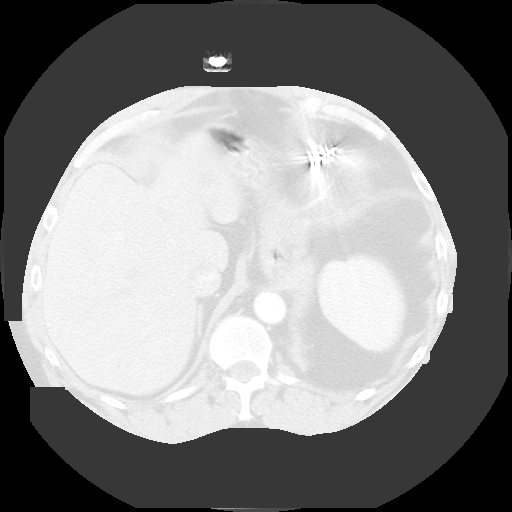
[im 17/65  lung]
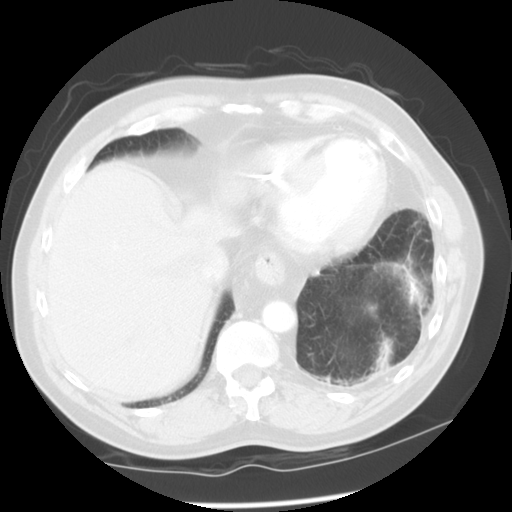
[im 22/65  mediastinal]
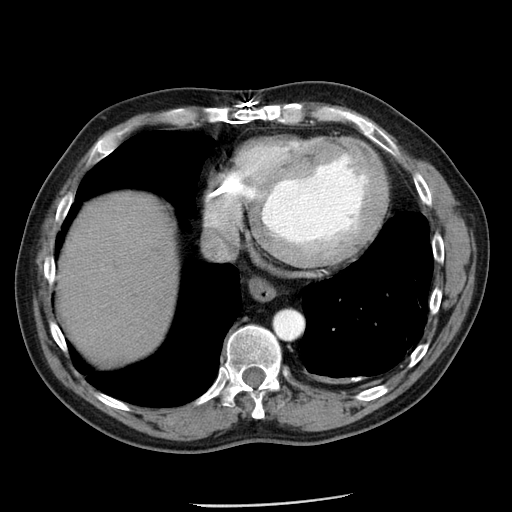
[im 22/65  lung]
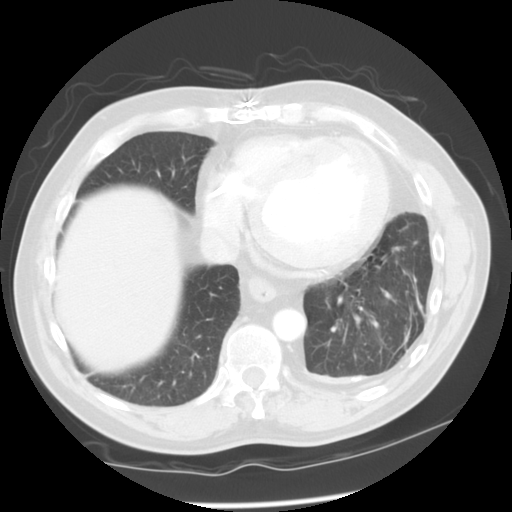
[im 26/65  lung]
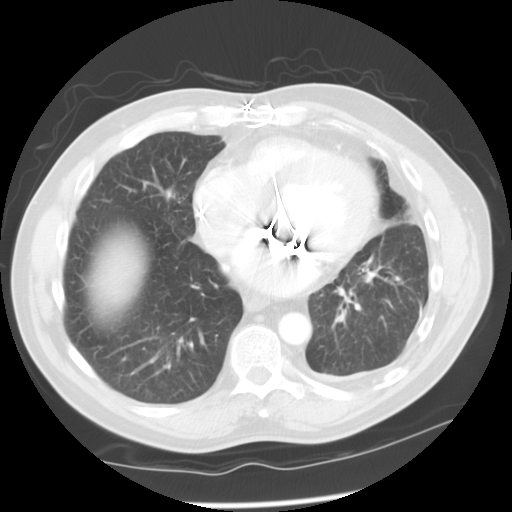
[im 29/65  lung]
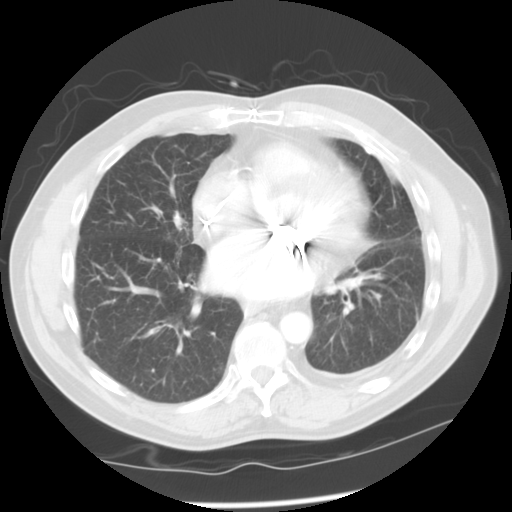
[im 34/65  lung]
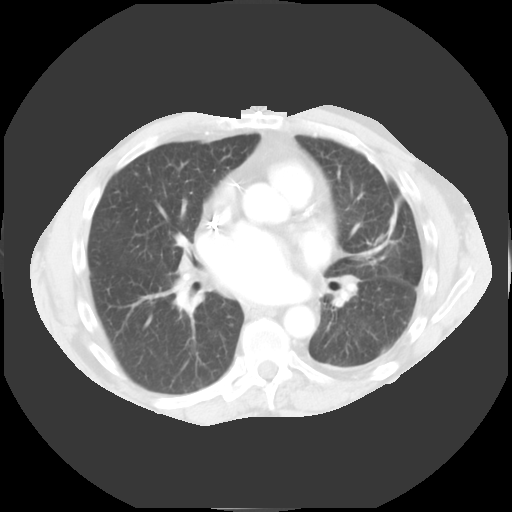
[im 36/65  mediastinal]
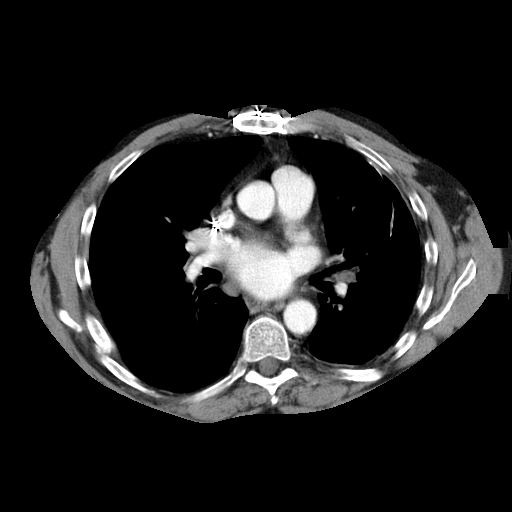
[im 36/65  lung]
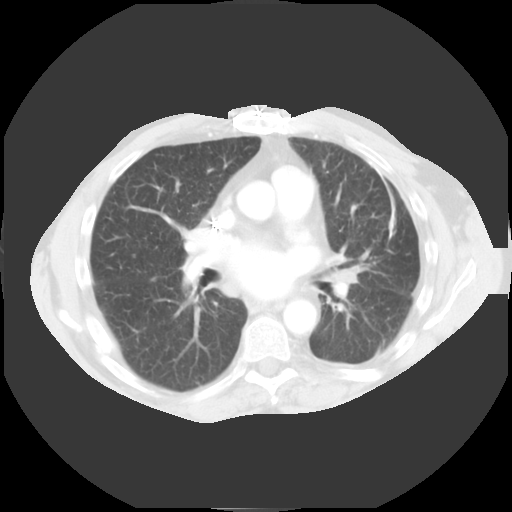
[im 39/65  lung]
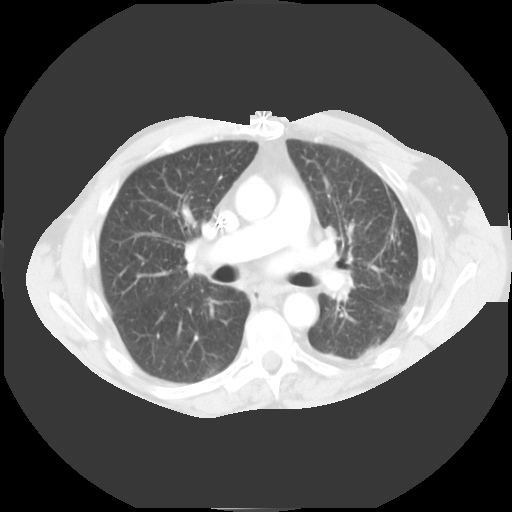
[im 43/65  lung]
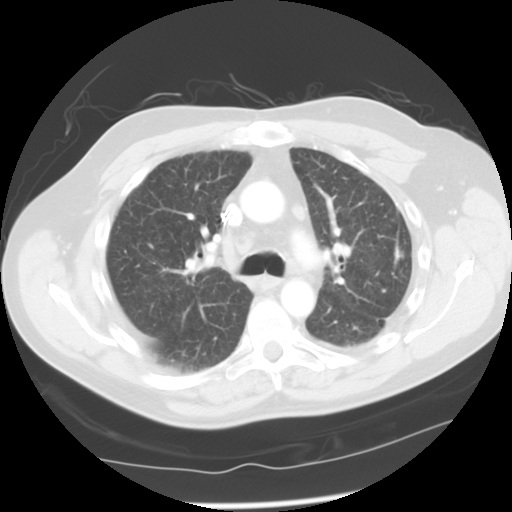
[im 48/65  lung]
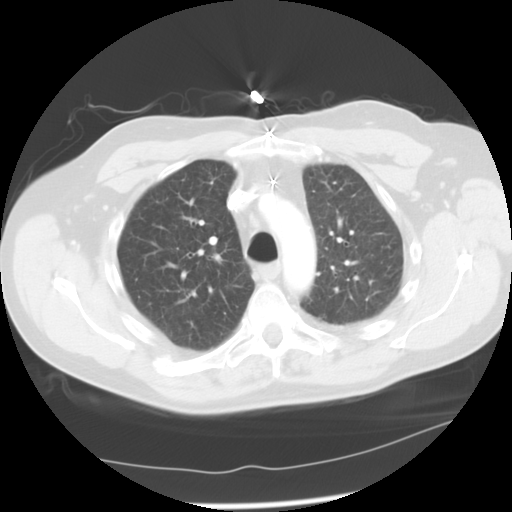
[im 52/65  mediastinal]
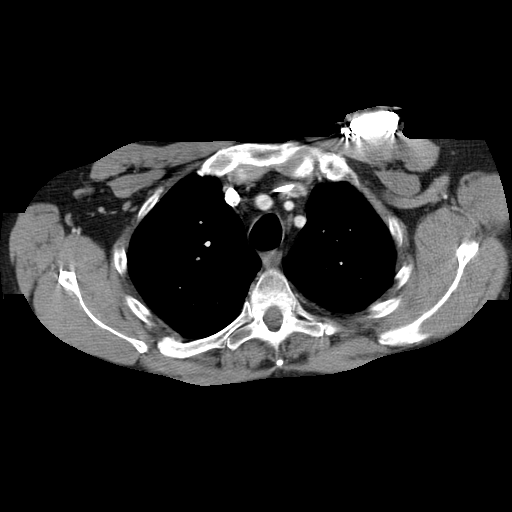
[im 52/65  lung]
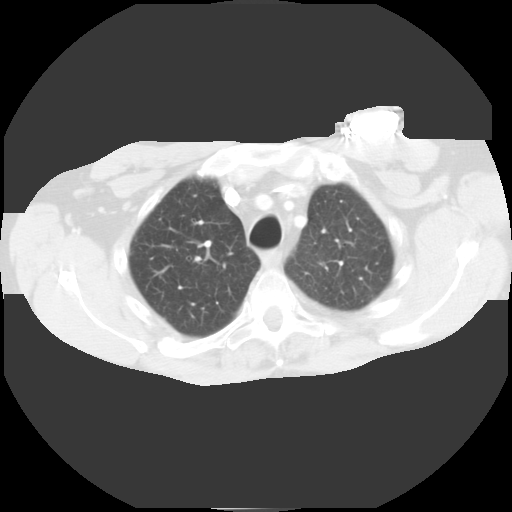
[im 55/65  lung]
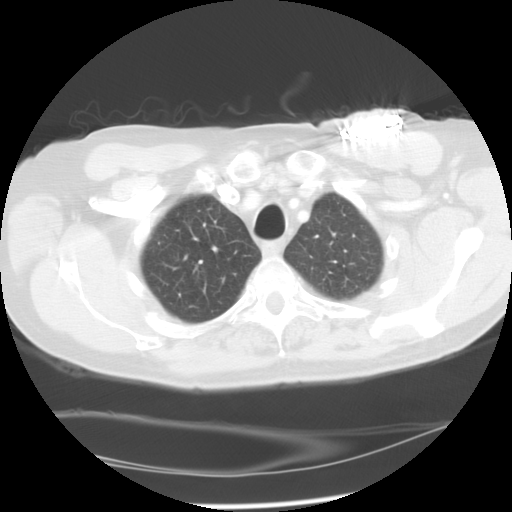
[im 60/65  lung]
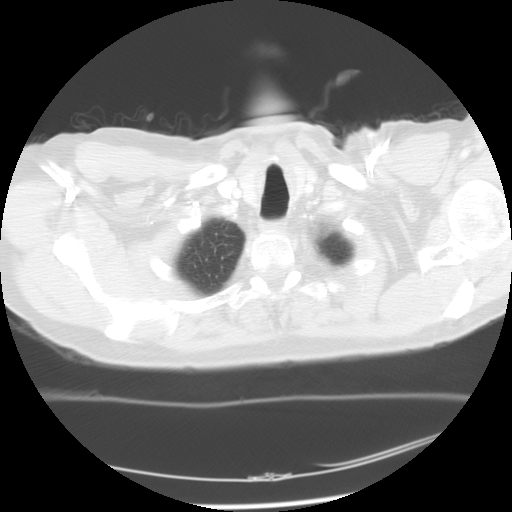

[15 of 33 positions shown; findings below may reference images not displayed]

FINDINGS: Stable dual lead left-sided permanent cardiac pacemaker with leads ending in the region of the right and ventricle, mitral valve replacement with left ventricular hypertrophy are noted.   Stable small likely benign mediastinal lymph nodes are seen without progressive pathological mediastinal, hilar nor axillary mass/adenopathy.  No change in calcified pleural plaques left lung base with benign-appearing linear fibrosis primarily at the left lower lobe and lingula.  The 3 mm right middle lobe nodule is less well visualized with no interval growth.  The lungs are otherwise clear.  Post-CABG changes stable, 3 mm non-obstructing gallstone, small hiatus hernia are incidentally noted.
IMPRESSION: 1.  Stable left basilar calcified pleural plaques 0and scarring of left lung base and to a lesser extent lingula.  
2.  Tiny 3 mm right middle lobe nodule less well visualized due to small size and different slice level with no progression consistent with benign etiology. 
3.  Mitral valve replacement and permanent cardiac pacemaker leads as described. 
4.  Non-obstructing gallstone and small hiatus hernia.

## 2006-03-18 IMAGING — CR DG CHEST 2V
2 series · 2 of 2 positions shown · non-contrast
Comparison: 03/22/05 and 08/13/04.

CLINICAL DATA: Fever. 
 PA AND LATERAL CHEST:

[w chest pa]
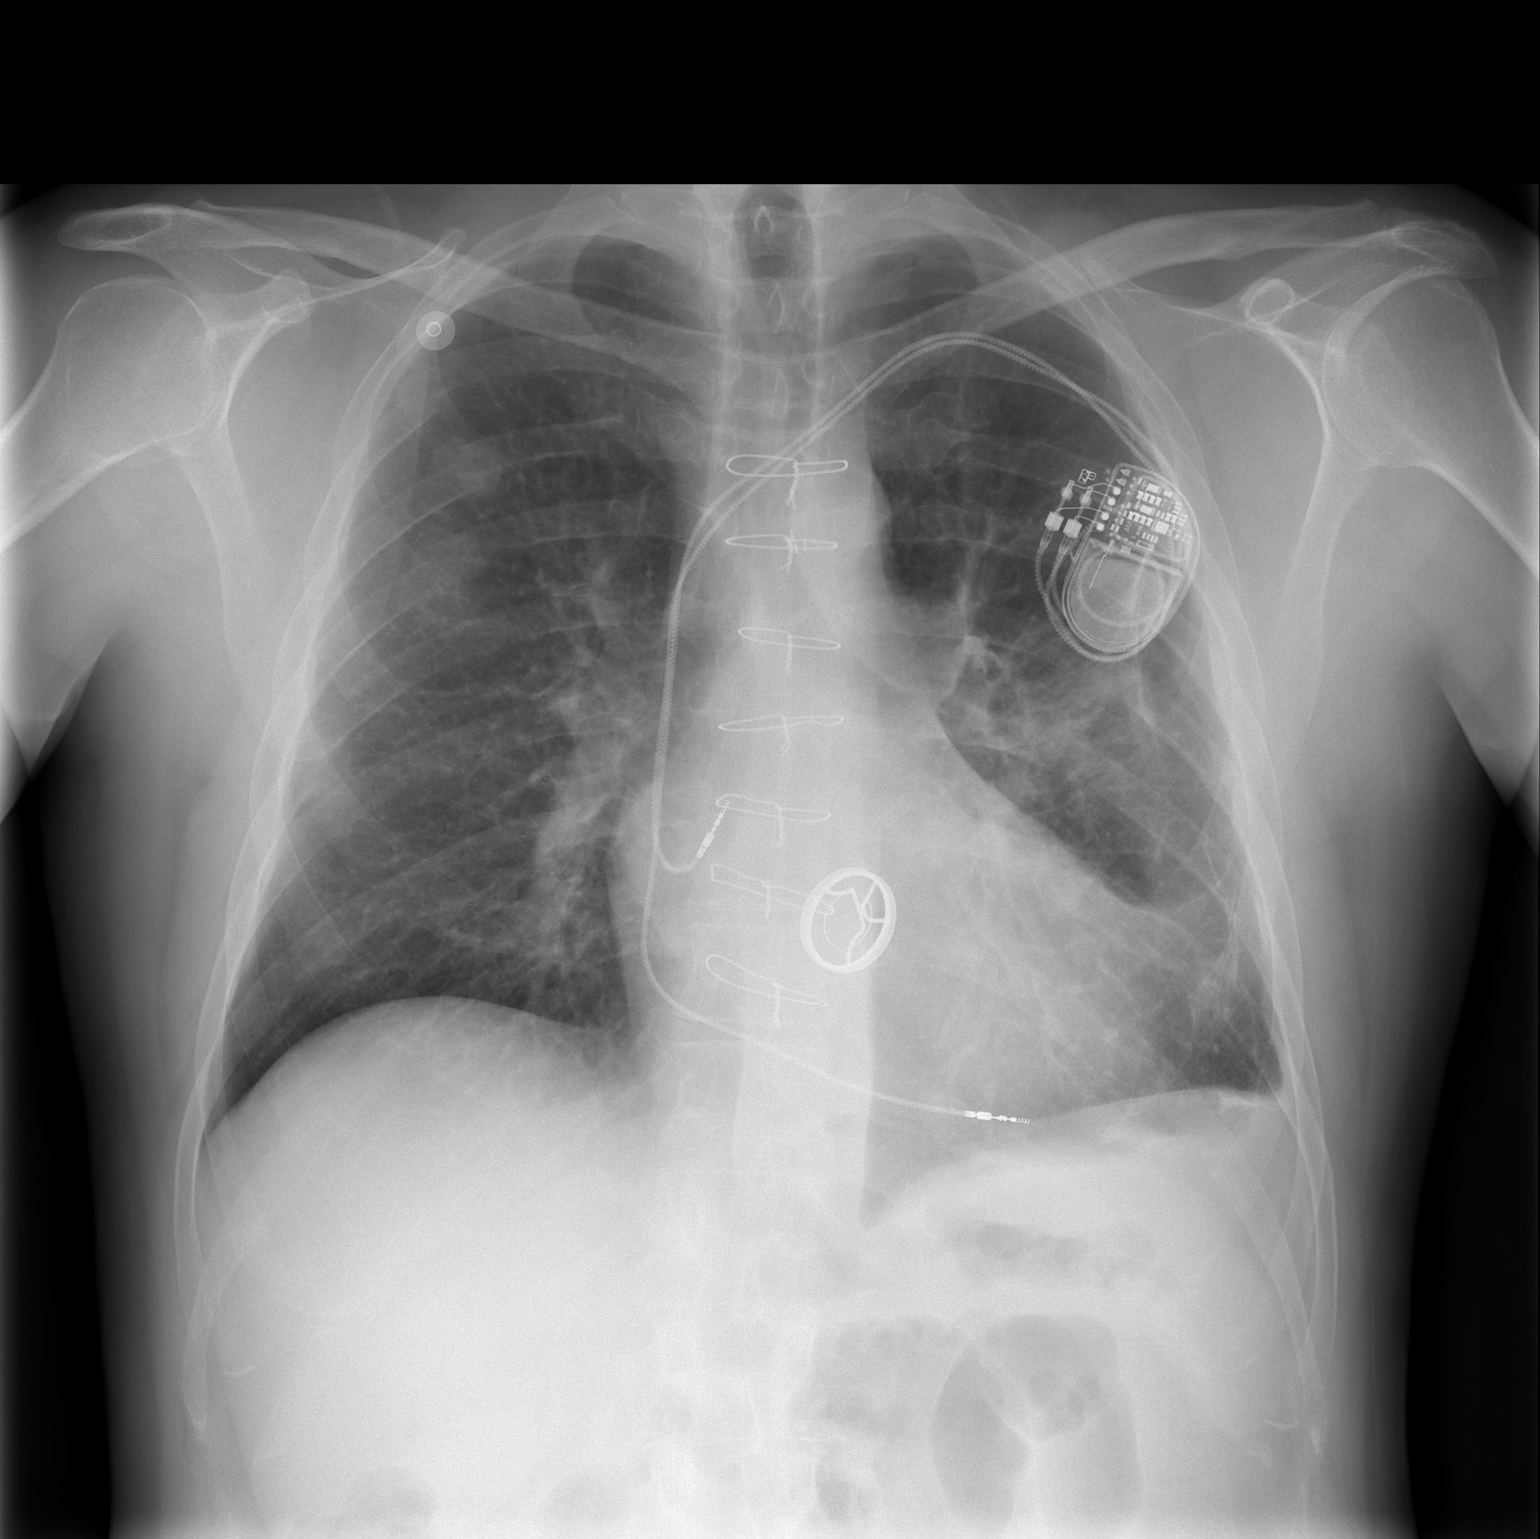

[w chest lat]
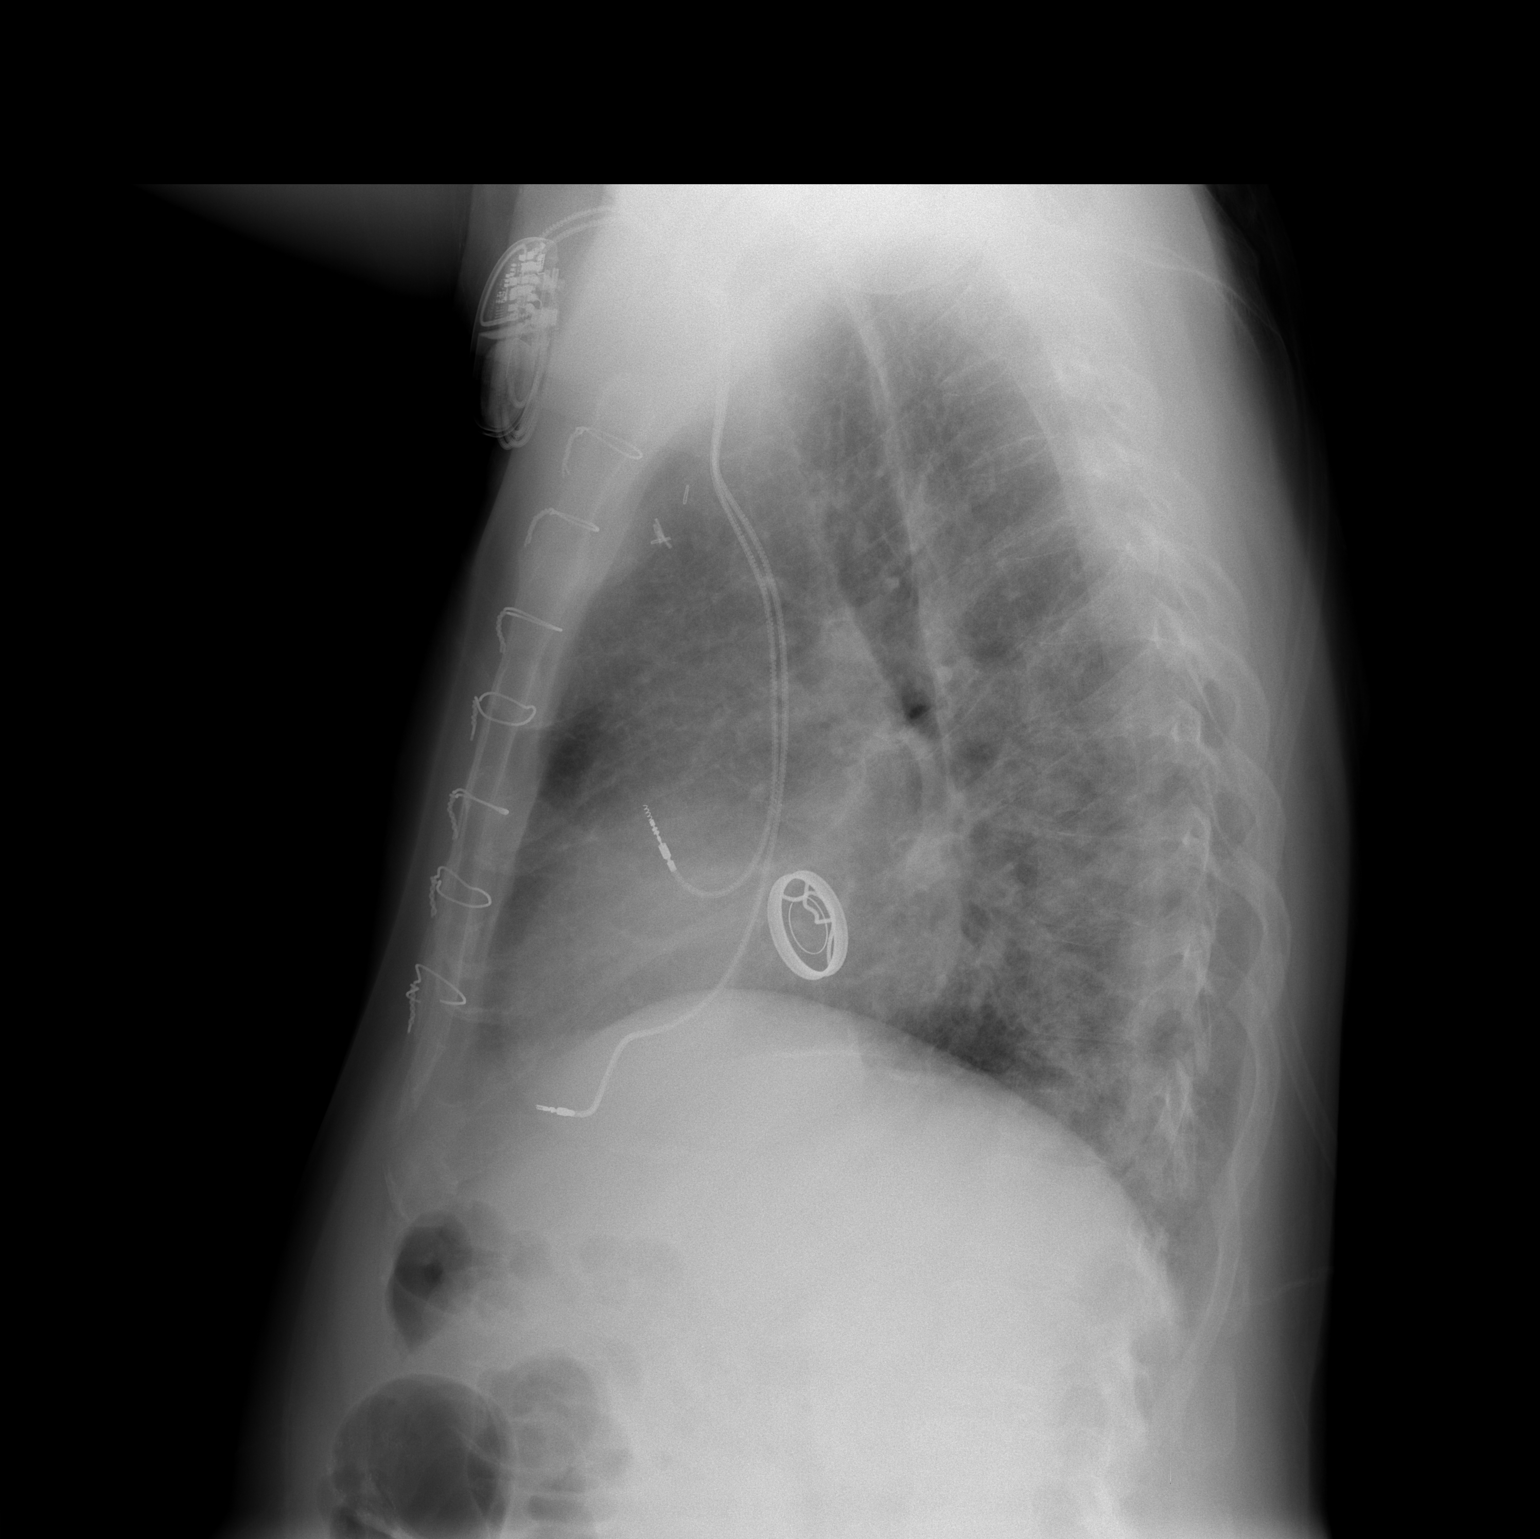

[2 of 2 positions shown; findings below may reference images not displayed]

FINDINGS: Again seen is extensive pleuroparenchymal scarring on the left without change.  Right lung is clear.  Patient has several healing right rib fractures involving the right fourth, fifth, sixth, and seventh ribs.  No pleural effusion.
IMPRESSION: No acute cardiopulmonary disease with right rib fractures again noted.  These were present on chest x-ray of 03/22/05.

## 2006-04-14 IMAGING — CR DG CHEST 1V PORT
1 series · 1 of 1 positions shown · non-contrast
Comparison: none

CLINICAL DATA: Short of breath, arrhythmia, hypertension.  Reformed smoker.  Pacemaker. 
 PORTABLE CHEST ? 1 VIEW:

[view not recorded]
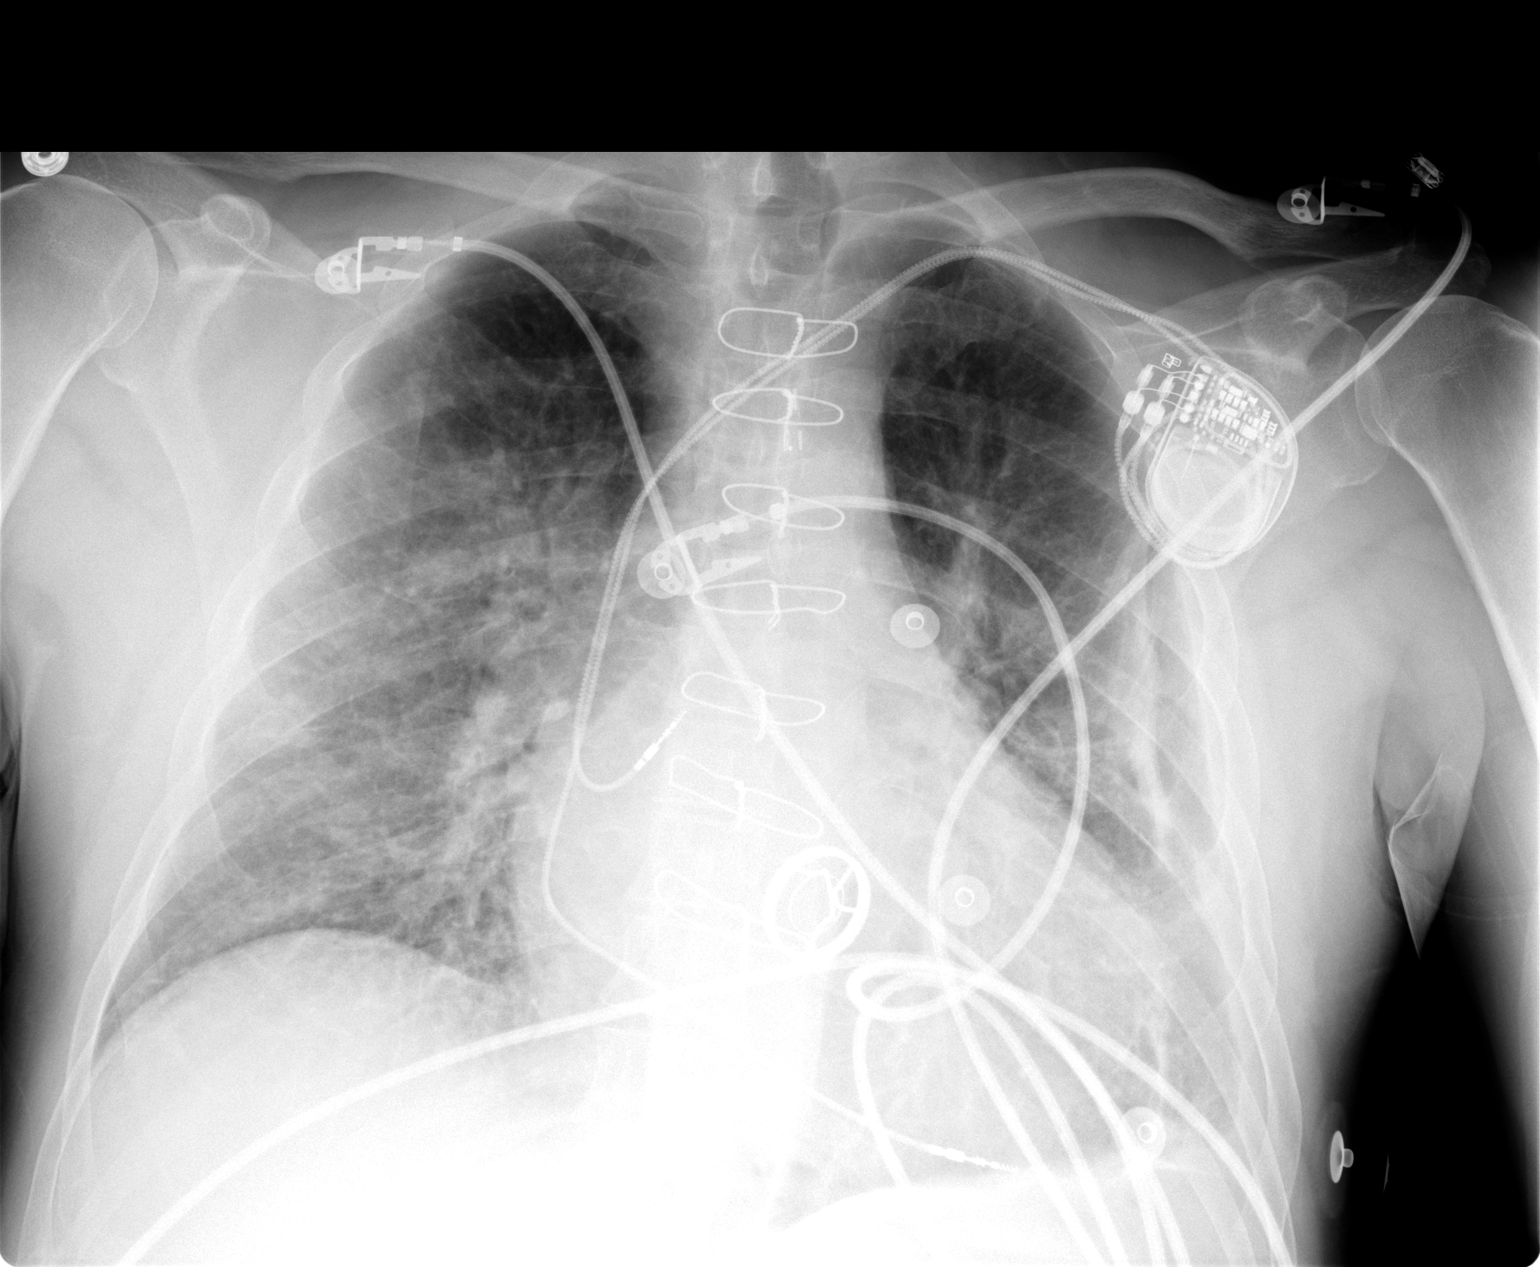

[1 of 1 positions shown; findings below may reference images not displayed]

FINDINGS: An AP semi-erect portable film of the chest made 07/07/05 at 5840 hours is compared to the previous study of 06/10/05 and now shows interval worsening of cardiomegaly and edema.  There is again seen diffuse peribronchial thickening and reticular nodular changes associated with the right mid and upper lung and pleural thickening and blunting of the left costophrenic angle.  There is noted the dual-lead pacer system which appears to be intact and unchanged.  There also appears to be a mitral valve replacement.  The bones are unchanged and are within the limits of normal.
IMPRESSION: Cardiomegaly.  Worsening of edema.  Bilateral pleural scarring.  Reticular nodular changes noted on the right, unchanged.  Stable dual-lead pacer system.  Mitral valve replacement.

## 2006-06-07 ENCOUNTER — Ambulatory Visit: Payer: Self-pay | Admitting: Internal Medicine

## 2006-06-07 ENCOUNTER — Inpatient Hospital Stay (HOSPITAL_COMMUNITY): Admission: EM | Admit: 2006-06-07 | Discharge: 2006-06-27 | Payer: Self-pay | Admitting: Emergency Medicine

## 2006-06-08 HISTORY — PX: CORONARY ARTERY BYPASS GRAFT: SHX141

## 2006-06-08 HISTORY — PX: CARDIAC CATHETERIZATION: SHX172

## 2006-06-09 ENCOUNTER — Encounter (INDEPENDENT_AMBULATORY_CARE_PROVIDER_SITE_OTHER): Payer: Self-pay | Admitting: Cardiology

## 2006-06-09 ENCOUNTER — Encounter (INDEPENDENT_AMBULATORY_CARE_PROVIDER_SITE_OTHER): Payer: Self-pay | Admitting: Neurology

## 2006-06-11 ENCOUNTER — Encounter: Payer: Self-pay | Admitting: Vascular Surgery

## 2006-06-11 ENCOUNTER — Encounter: Payer: Self-pay | Admitting: Cardiology

## 2006-06-17 ENCOUNTER — Encounter (INDEPENDENT_AMBULATORY_CARE_PROVIDER_SITE_OTHER): Payer: Self-pay | Admitting: *Deleted

## 2006-06-28 ENCOUNTER — Emergency Department (HOSPITAL_COMMUNITY): Admission: EM | Admit: 2006-06-28 | Discharge: 2006-06-29 | Payer: Self-pay | Admitting: Emergency Medicine

## 2006-07-01 ENCOUNTER — Encounter: Admission: RE | Admit: 2006-07-01 | Discharge: 2006-07-01 | Payer: Self-pay | Admitting: Cardiothoracic Surgery

## 2006-07-02 IMAGING — CR DG CHEST 2V
2 series · 2 of 2 positions shown · non-contrast
Comparison: 07/07/05.

CLINICAL DATA: Atrial flutter. 
 CHEST ? 2 VIEW:

[w chest pa]
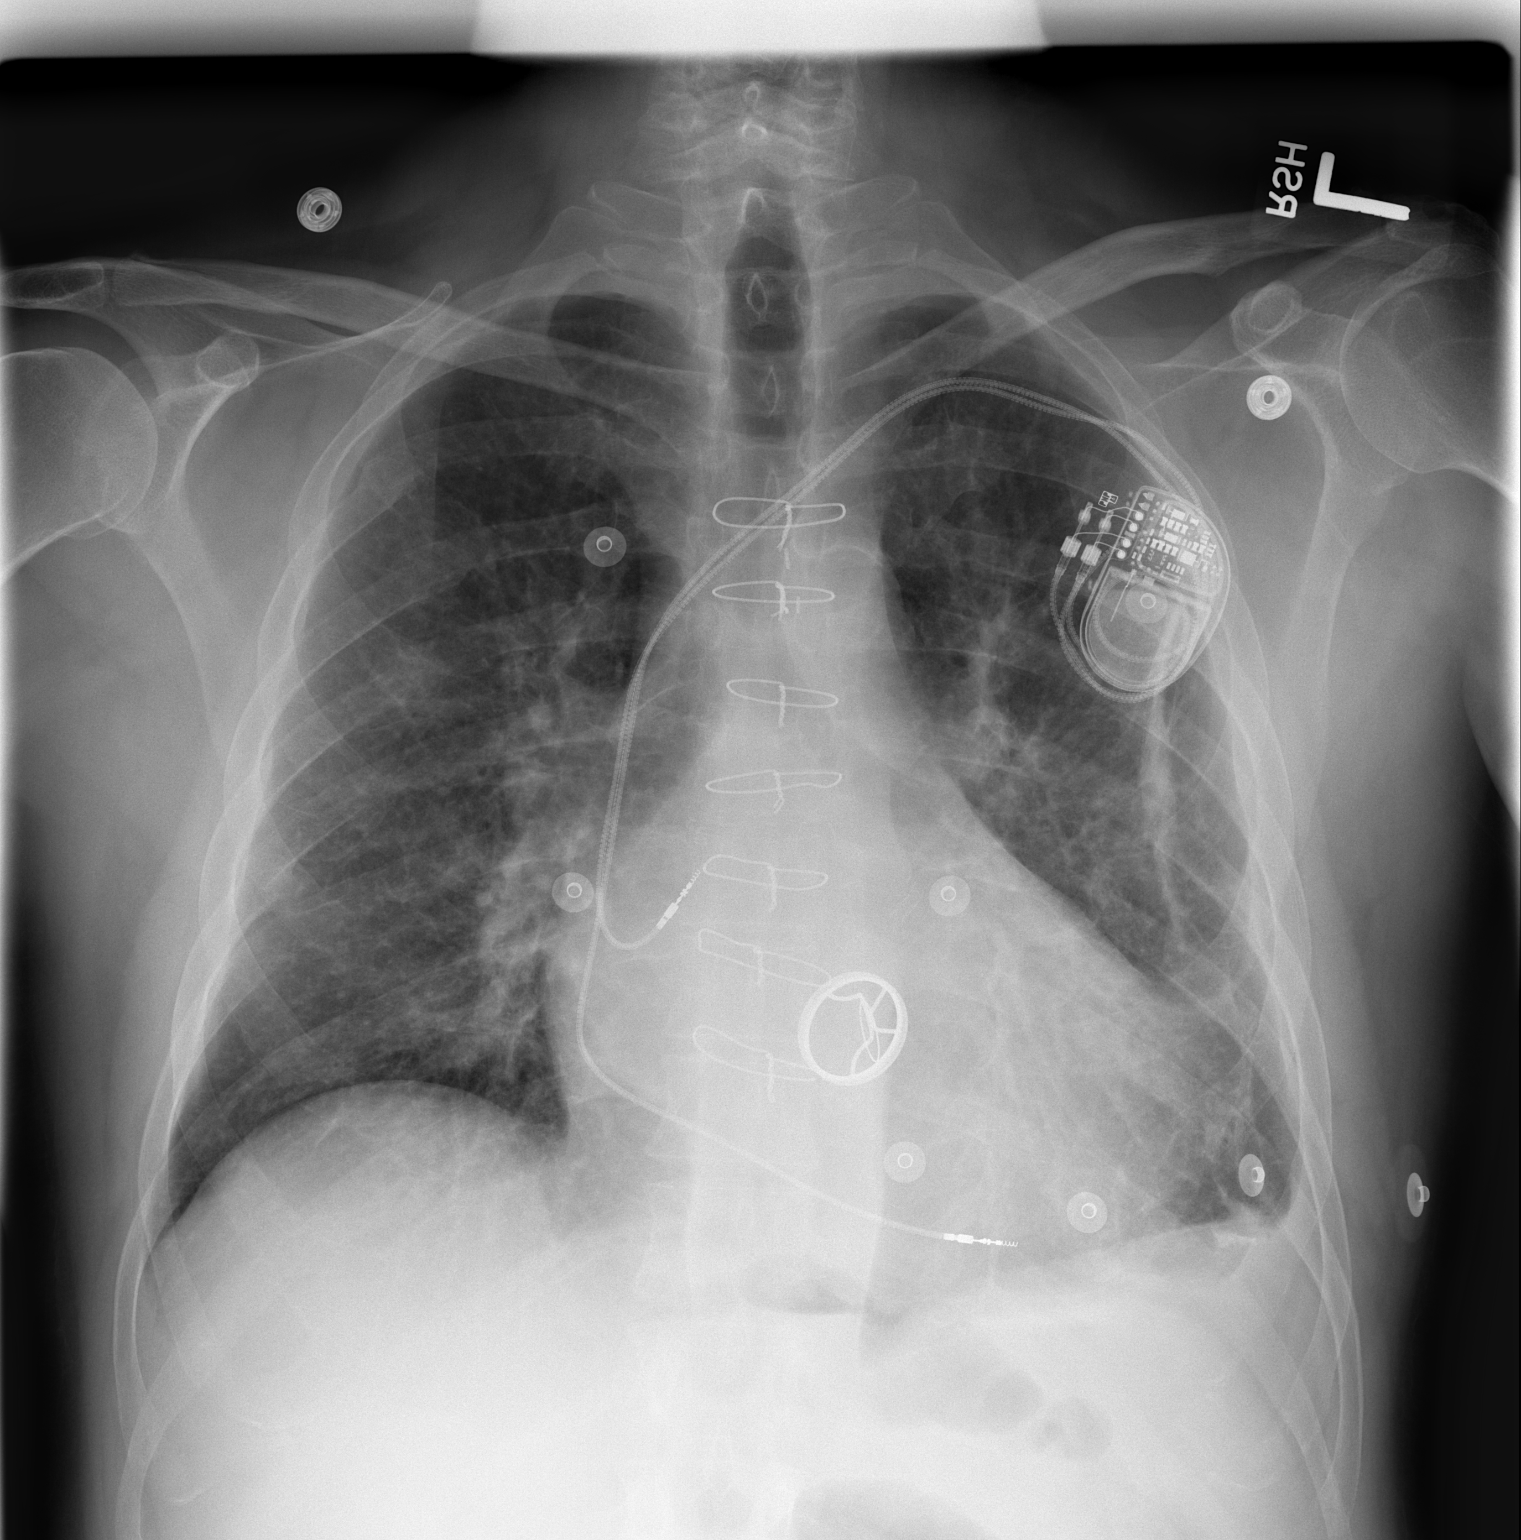

[w chest lat]
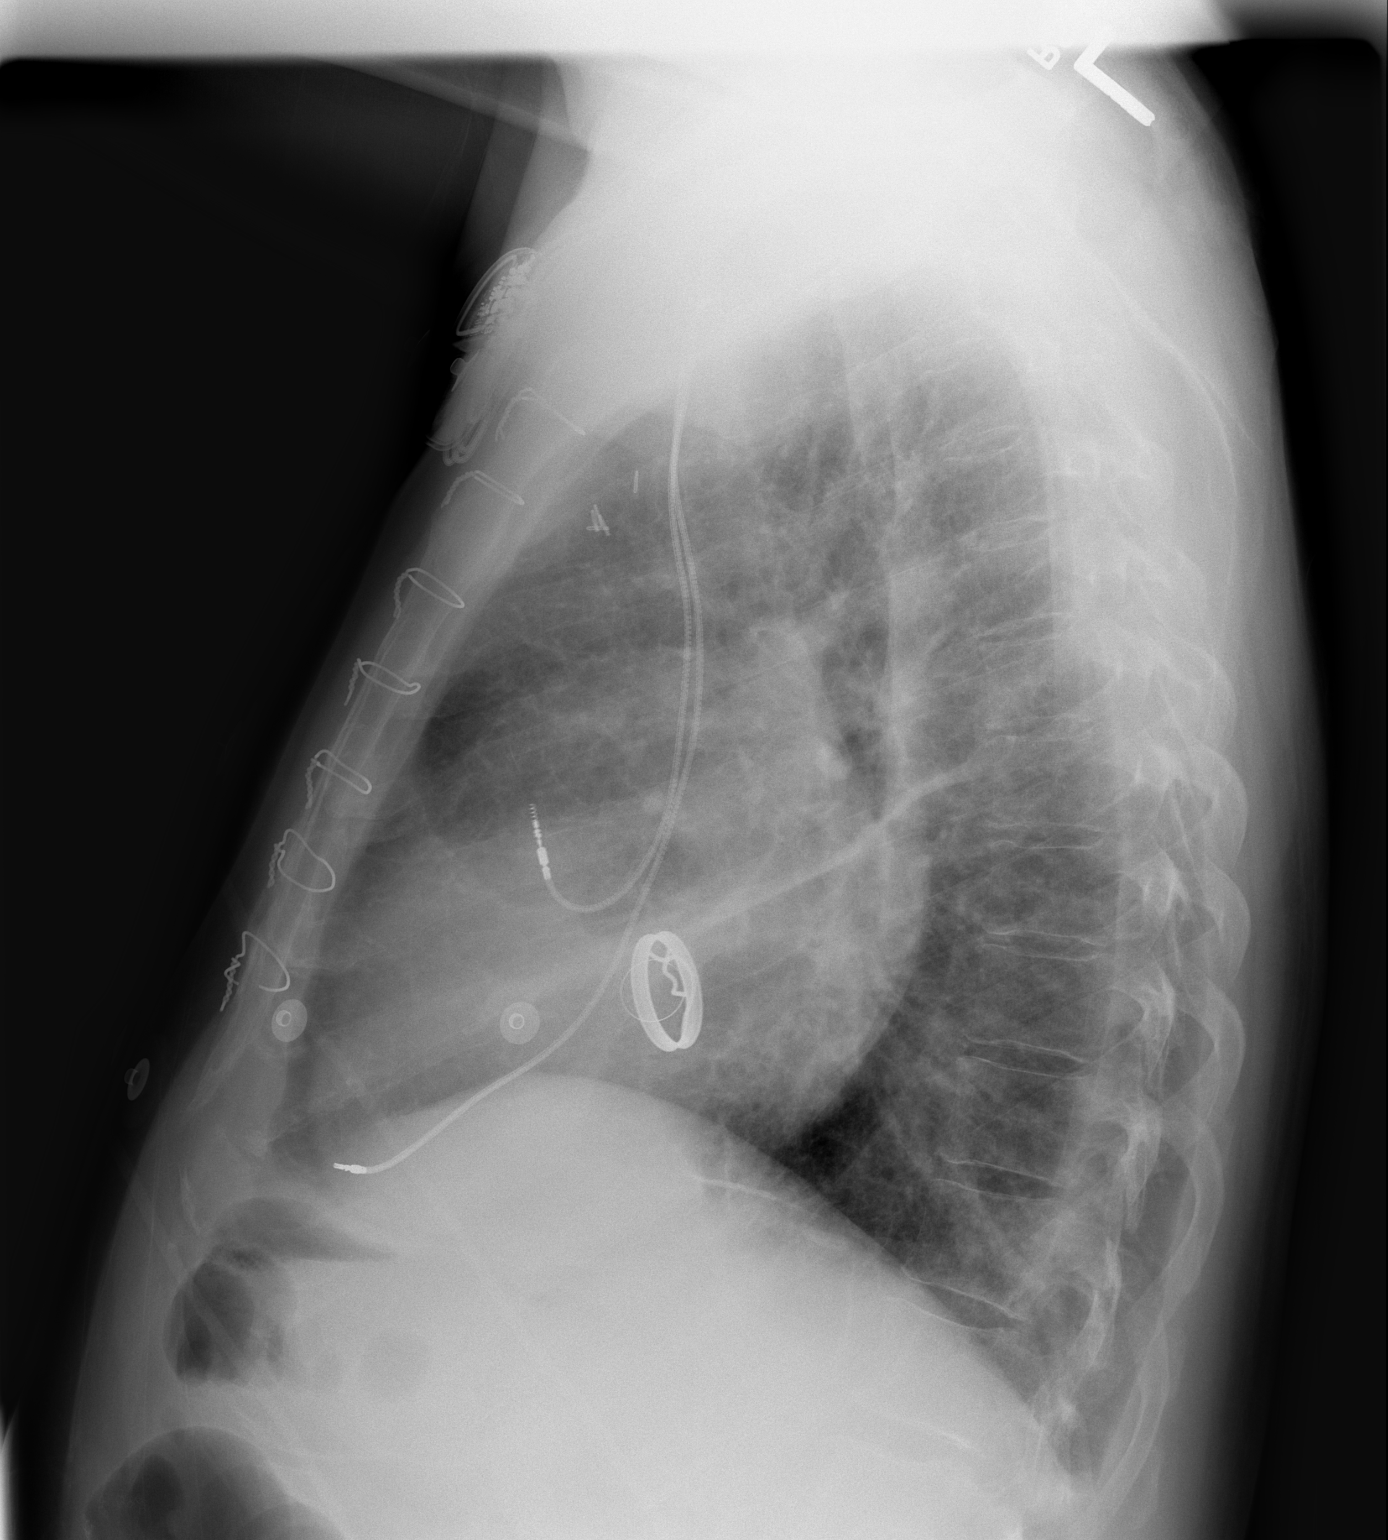

[2 of 2 positions shown; findings below may reference images not displayed]

FINDINGS: Mild cardiomegaly is stable.  A pacemaker is in place with the lead tubes projecting over the left atrium and left ventricle.  There is evidence of prior sternotomy and valve replacement.  Mild interstitial edema is present today, but the appearance has improved since the 07/07/05 exam.
IMPRESSION: 1.  Stable, mild cardiomegaly.  
 2.  Interstitial edema.  The overall appearance has improved compared with the 07/07/05 film.

## 2006-07-14 IMAGING — CR DG CHEST 1V PORT
1 series · 1 of 1 positions shown · non-contrast
Comparison: 09/24/2005

CLINICAL DATA: Shortness of breath

PORTABLE CHEST - 1 VIEW:

[view not recorded]
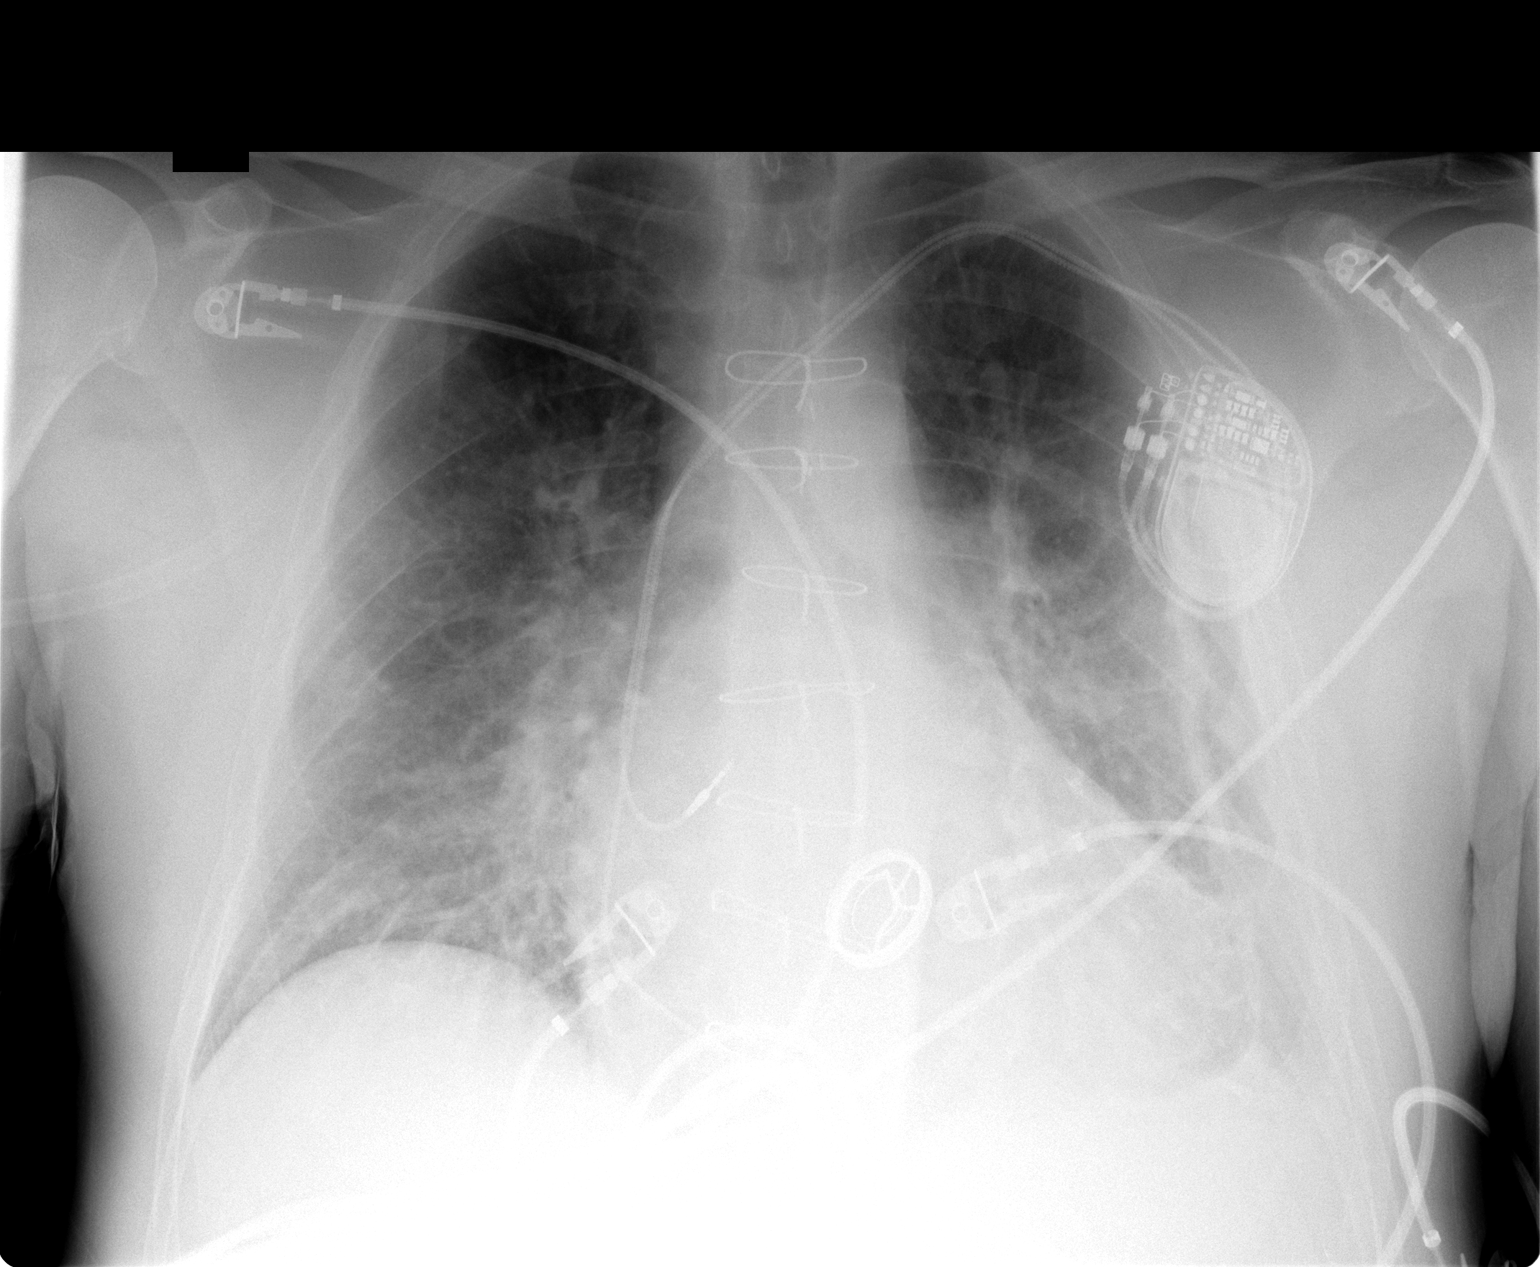

[1 of 1 positions shown; findings below may reference images not displayed]

FINDINGS: Left dual-lead pacer remains in place, unchanged. Patient is status
post valve replacement. There is cardiomegaly with mild perihilar and lower lobe
airspace opacities compatible with mild edema. Scarring in the left mid and
lower lung again noted.
IMPRESSION: Mild CHF

## 2006-07-19 ENCOUNTER — Emergency Department (HOSPITAL_COMMUNITY): Admission: EM | Admit: 2006-07-19 | Discharge: 2006-07-20 | Payer: Self-pay | Admitting: Emergency Medicine

## 2006-07-22 ENCOUNTER — Ambulatory Visit: Payer: Self-pay | Admitting: Cardiothoracic Surgery

## 2006-08-17 ENCOUNTER — Ambulatory Visit: Payer: Self-pay | Admitting: Thoracic Surgery (Cardiothoracic Vascular Surgery)

## 2006-08-24 IMAGING — CR DG CHEST 1V PORT
1 series · 1 of 1 positions shown · non-contrast
Comparison: none

CLINICAL DATA: Shortness of breath.
PORTABLE CHEST - 1 VIEW ? 11/16/05:
Comparing 10/06/05.

[view not recorded]
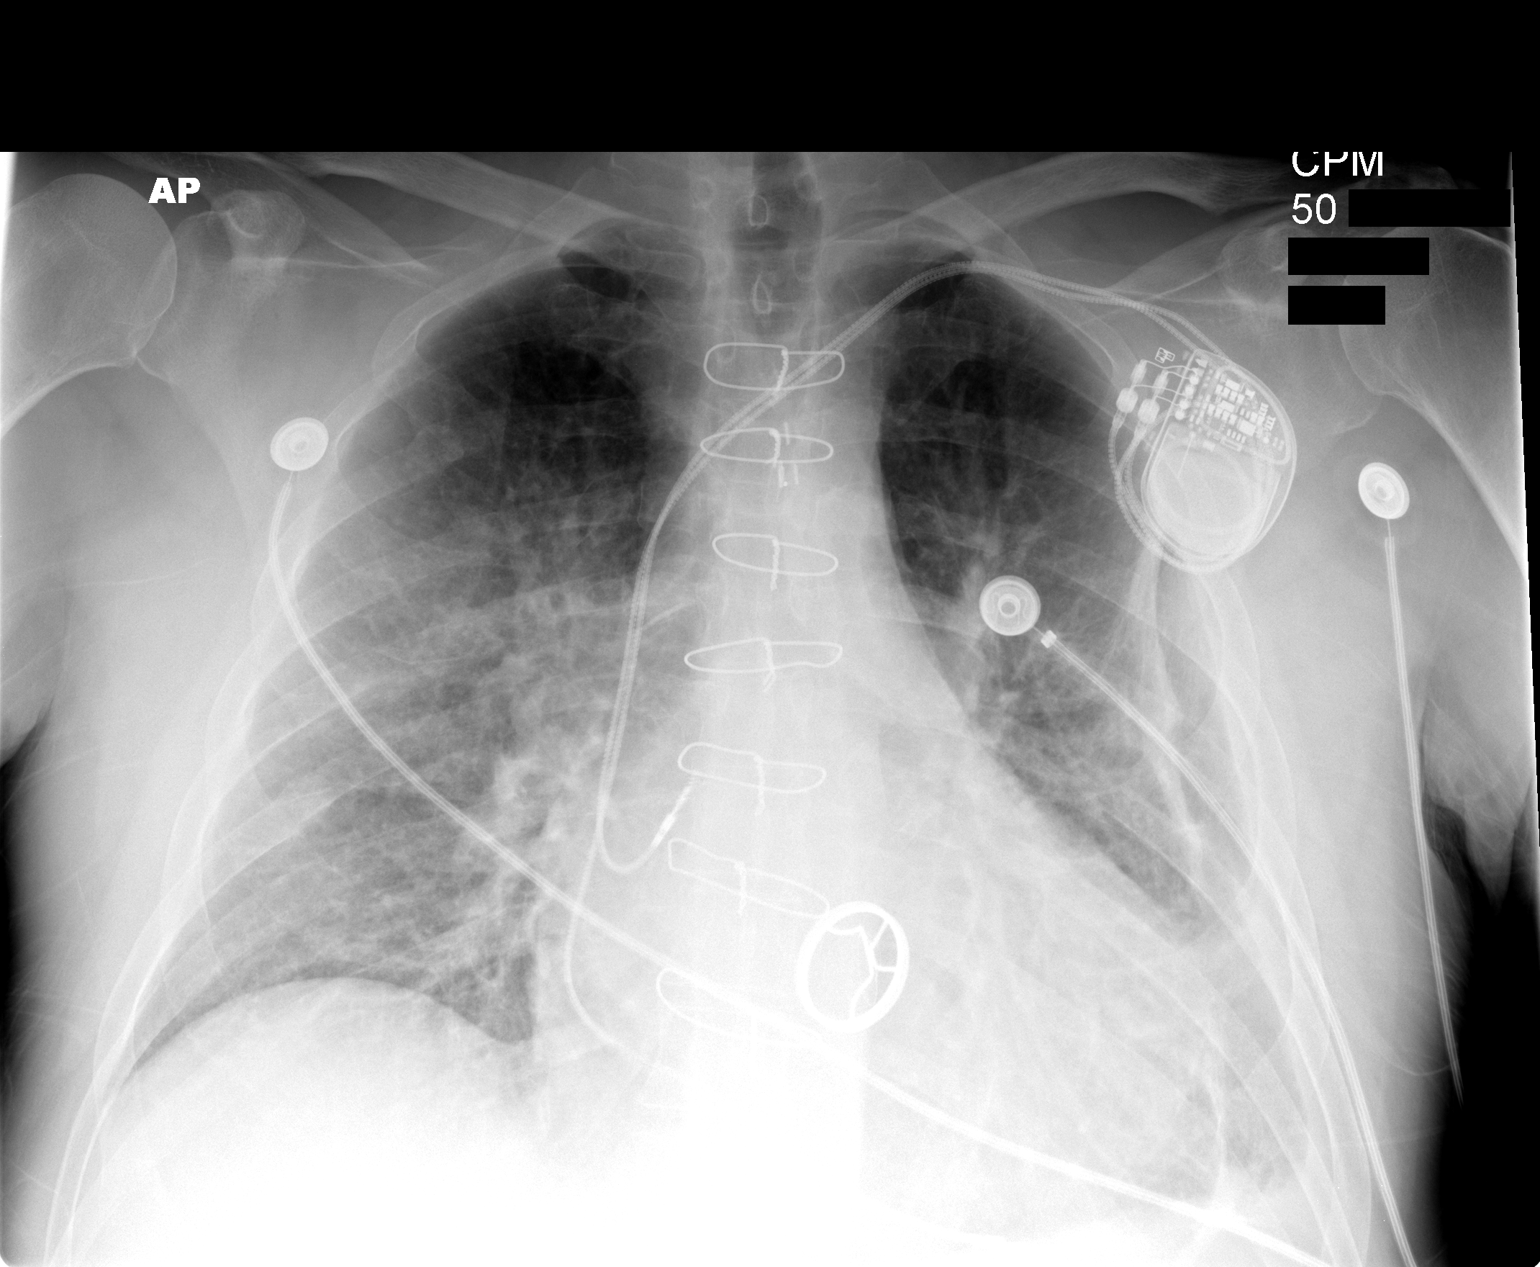

[1 of 1 positions shown; findings below may reference images not displayed]

FINDINGS: Mechanical mitral valve prosthesis noted.  Dual lead pacer noted.   Pleural calcifications are evident on the left.  There is also longitudinal scarring in the left lung as has been shown on prior exams.  
Cardiomegaly is present along with interstitial edema.  The left costophrenic angle is blunted but this is a stable finding.
IMPRESSION: Cardiomegaly with moderate edema.

## 2006-09-10 ENCOUNTER — Encounter: Admission: RE | Admit: 2006-09-10 | Discharge: 2006-09-10 | Payer: Self-pay | Admitting: *Deleted

## 2006-10-25 ENCOUNTER — Emergency Department (HOSPITAL_COMMUNITY): Admission: EM | Admit: 2006-10-25 | Discharge: 2006-10-25 | Payer: Self-pay | Admitting: Emergency Medicine

## 2007-03-15 IMAGING — CR DG CHEST 1V PORT
1 series · 1 of 1 positions shown · non-contrast
Comparison: 11/16/05.

CLINICAL DATA: Left sided pain.  Shortness of breath.  Hypertension.  
 PORTABLE CHEST ? 1 VIEW, 06/07/06, 9181 HOURS:

[view not recorded]
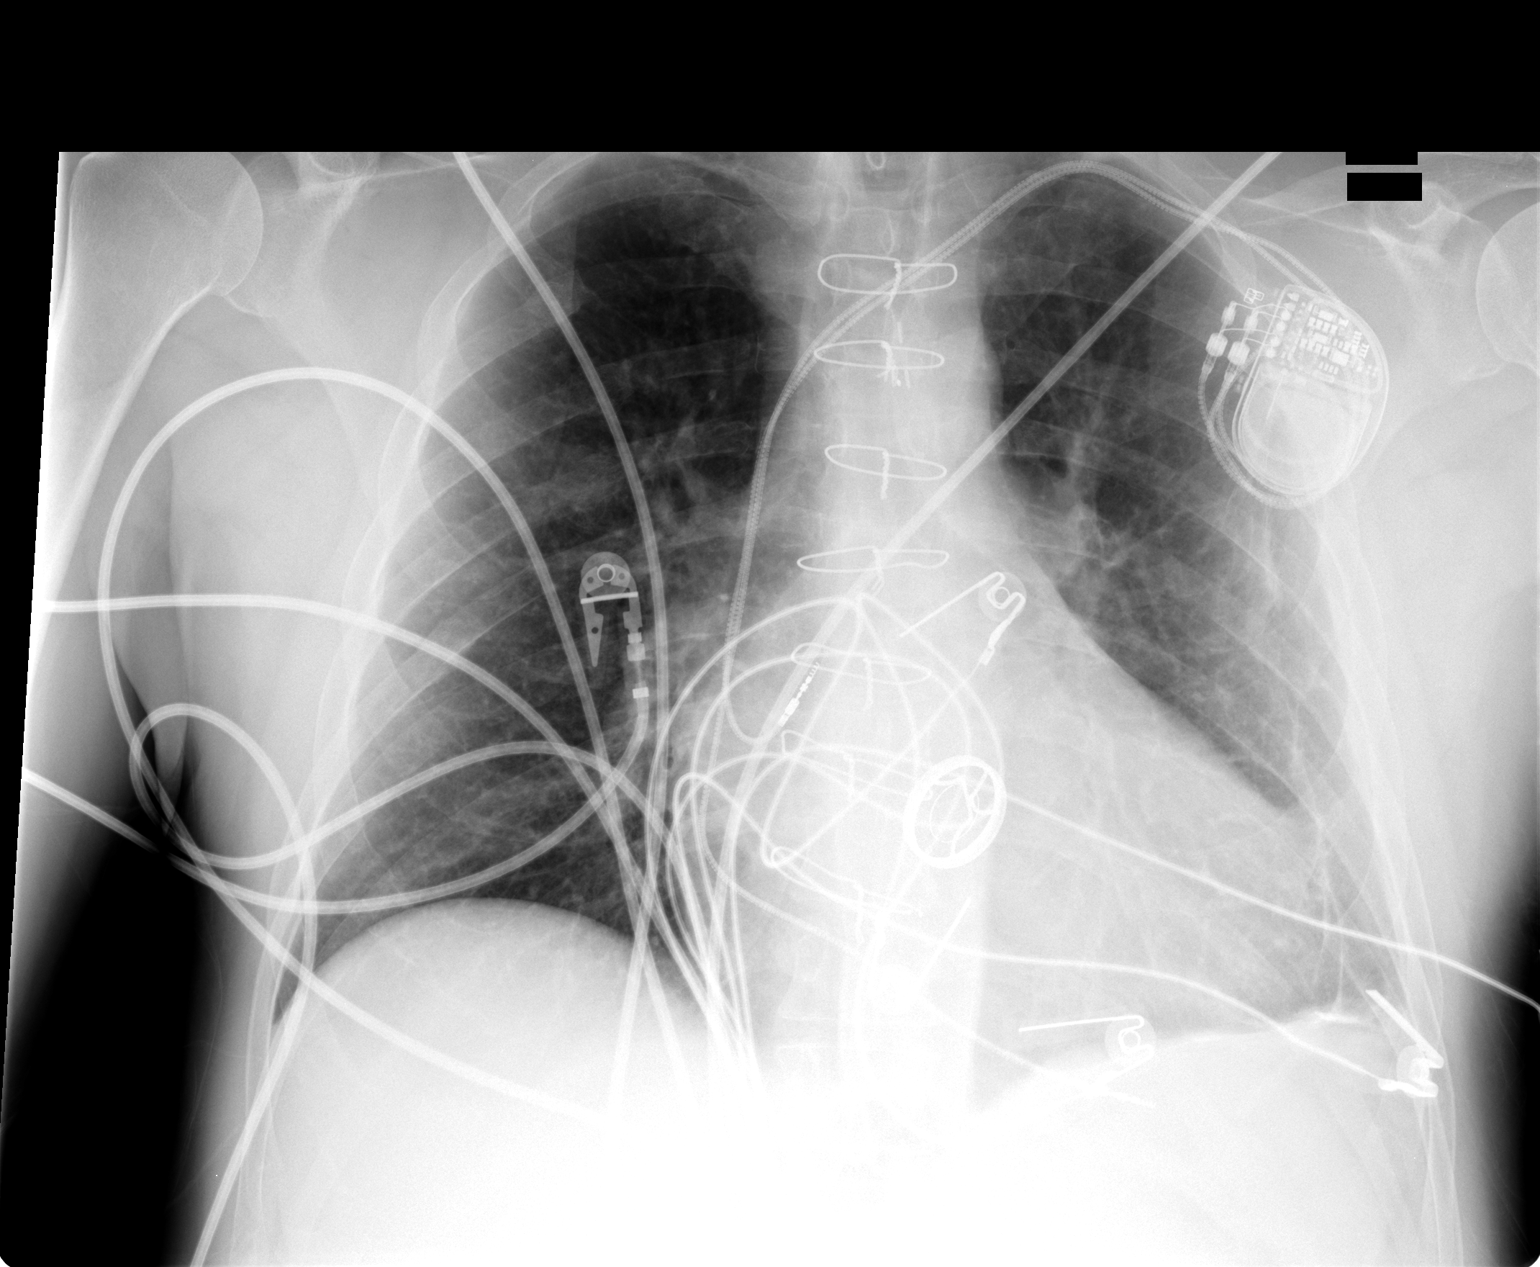

[1 of 1 positions shown; findings below may reference images not displayed]

FINDINGS: Cardiomegaly.  Mild to moderate pulmonary vascular congestion.  No frank congestive heart failure findings.  Linear scarring lateral aspect of the left lung as noted previously.  EKG leads project over the chest making it somewhat difficult to assess the patient?s right atrial and right ventricular leads which appear unchanged in position.
IMPRESSION: Cardiomegaly and possible pulmonary venous hypertension.  No frank congestive heart failure.

## 2007-03-15 IMAGING — CT CT HEAD W/O CM
1 of 2 series · 13 of 30 positions shown, 17 images · IV contrast (agent unspecified)
Comparison: none

CLINICAL DATA: Numbness in fingertips for 1 hour, 30 minutes. History of stroke in 2442. History of open heart surgery.
 HEAD CT WITHOUT CONTRAST:
TECHNIQUE: Contiguous axial images were obtained from the base of the skull through the vertex according to standard protocol without contrast.

[Series 2: do not angle gantry · axial · 0.47mm/px · z∈[+119,+239]mm · 13 of 30 slices shown, 17 images]
[im 3/30  brain]
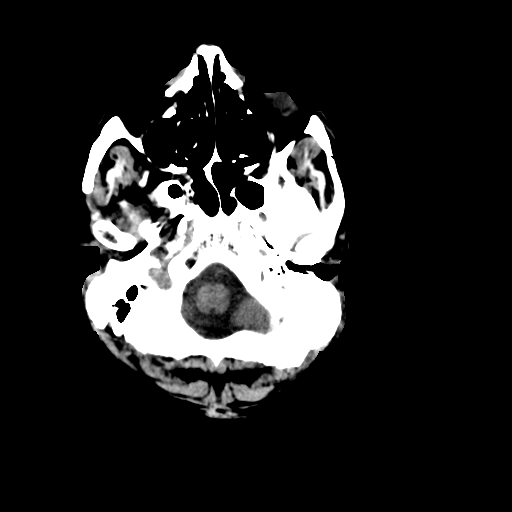
[im 3/30  bone]
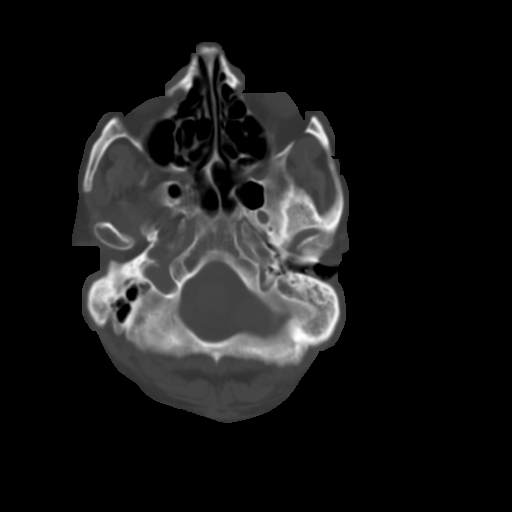
[im 5/30  brain]
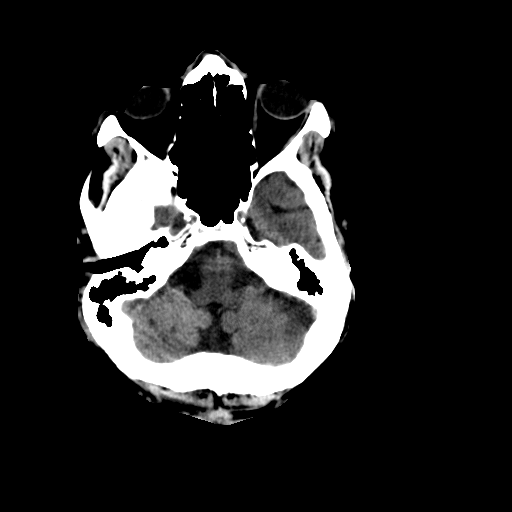
[im 7/30  brain]
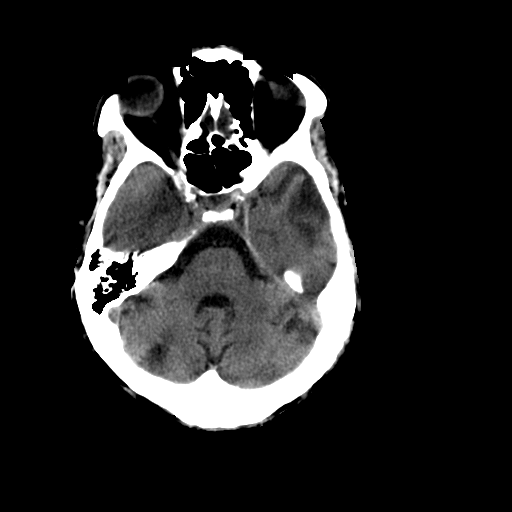
[im 9/30  brain]
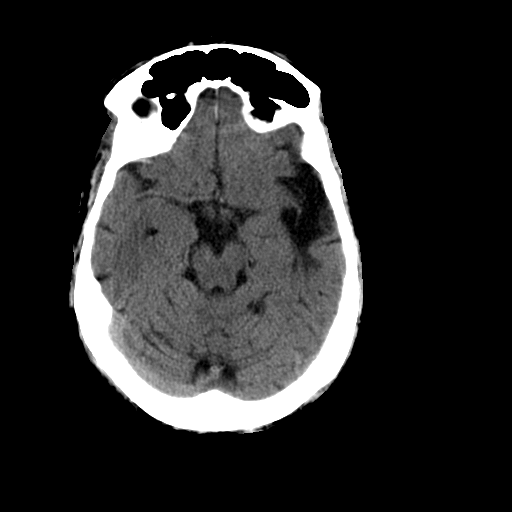
[im 11/30  brain]
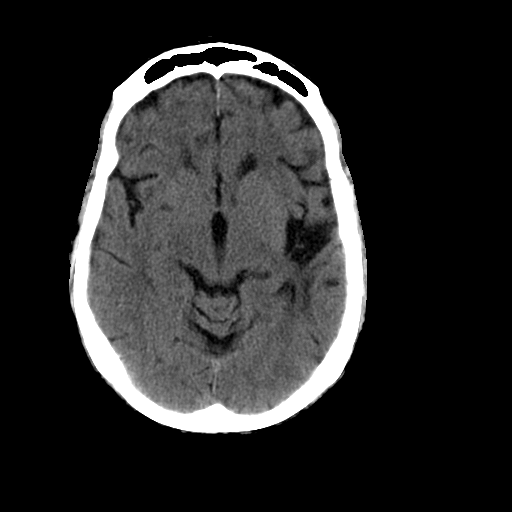
[im 11/30  bone]
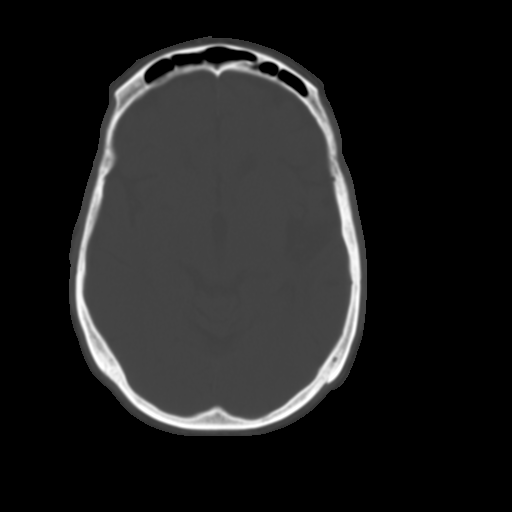
[im 13/30  brain]
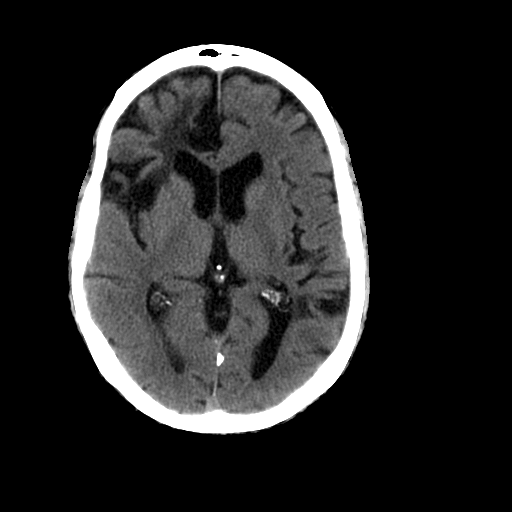
[im 15/30  brain]
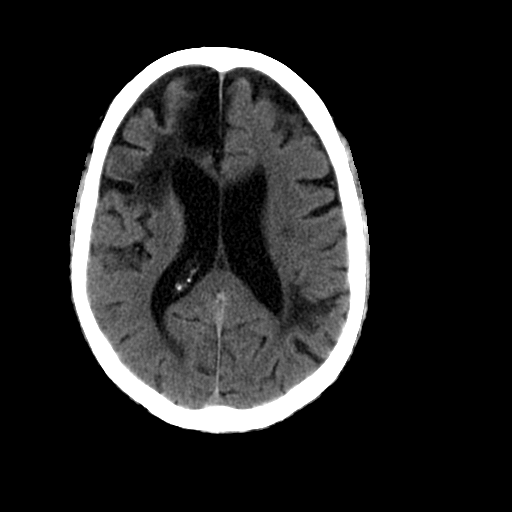
[im 17/30  brain]
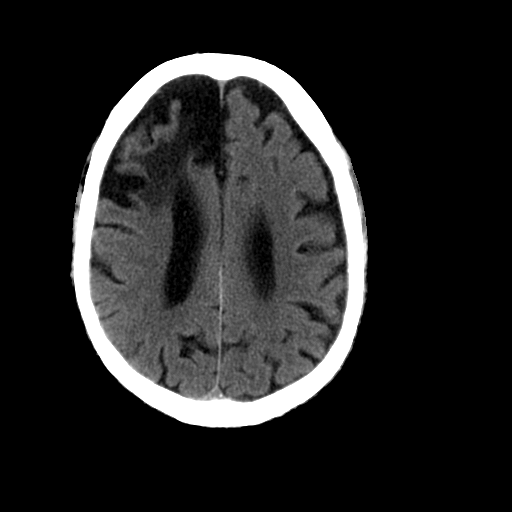
[im 19/30  brain]
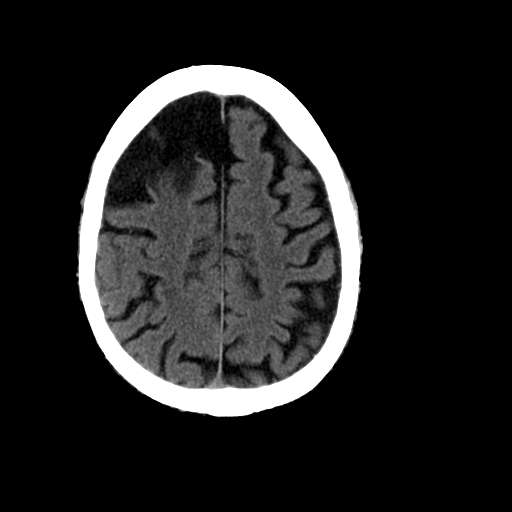
[im 19/30  bone]
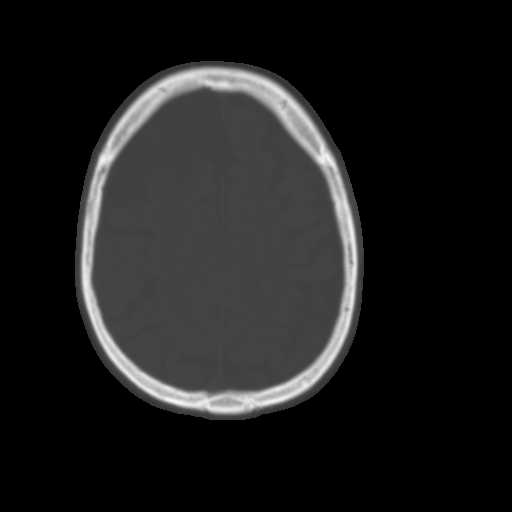
[im 21/30  brain]
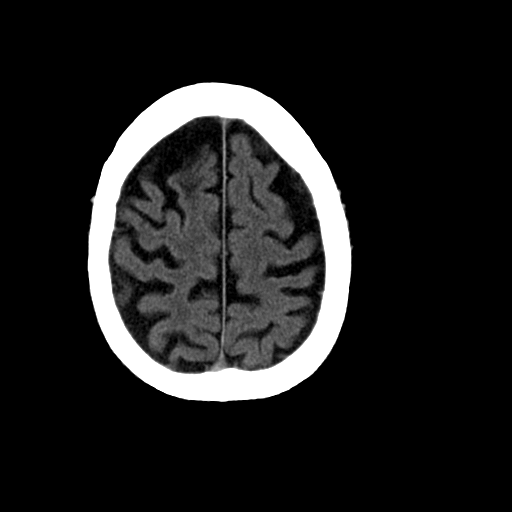
[im 23/30  brain]
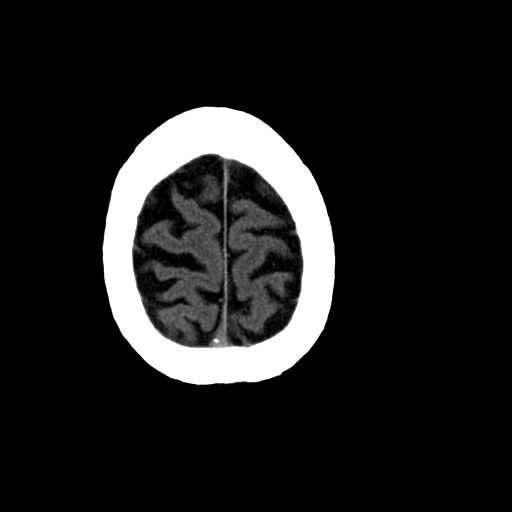
[im 25/30  brain]
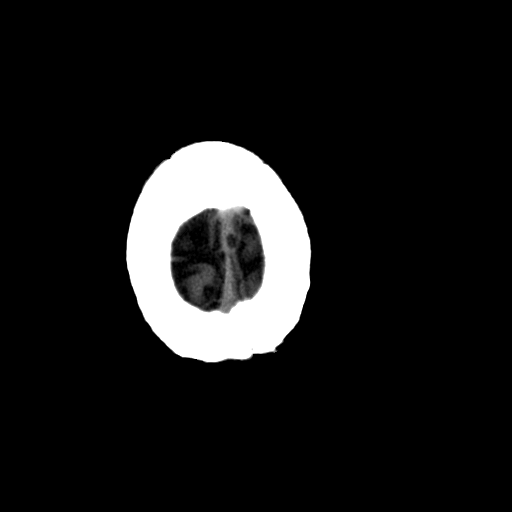
[im 27/30  brain]
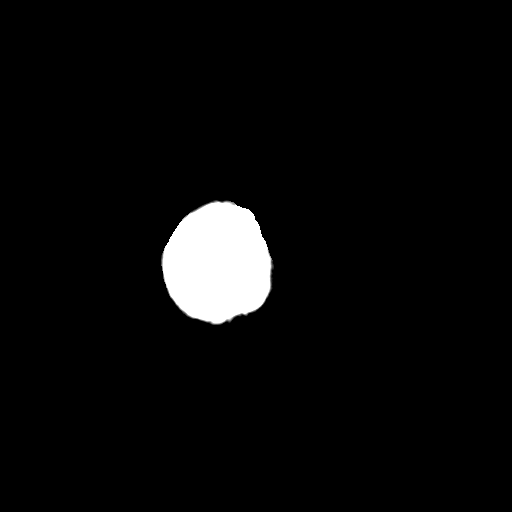
[im 27/30  bone]
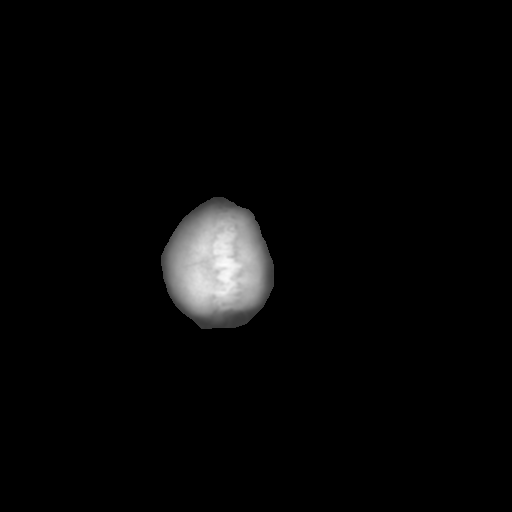

[13 of 30 positions shown; findings below may reference images not displayed]

FINDINGS: Compared with the prior exam, the appearance of the brain is unchanged.  Old infarcts are noted within the right frontal lobe and left temporoparietal lobe.  There is no evidence for acute stroke.  There are bilateral basal ganglia lacunar infarcts.  Old right cerebellar and left cerebellar infarcts are also noted.
 There is no edema or mass effect. The midline is maintained. The ventricular volumes are normal. No evidence for midline shift. No abnormal extraaxial fluid collection, intracranial hemorrhage, or mass.  Review of the bone windows shows old infarcts involving the right frontal and left parietal lobes.
IMPRESSION: No acute intracranial abnormalities.

## 2007-03-24 ENCOUNTER — Emergency Department (HOSPITAL_COMMUNITY): Admission: EM | Admit: 2007-03-24 | Discharge: 2007-03-24 | Payer: Self-pay | Admitting: Emergency Medicine

## 2007-03-24 IMAGING — CR DG CHEST 2V
2 series · 2 of 2 positions shown · non-contrast
Comparison: 06/07/06.

CLINICAL DATA: Preoperative respiratory exam.   Coronary artery disease. 
 CHEST ? 2 VIEW:

[w chest pa]
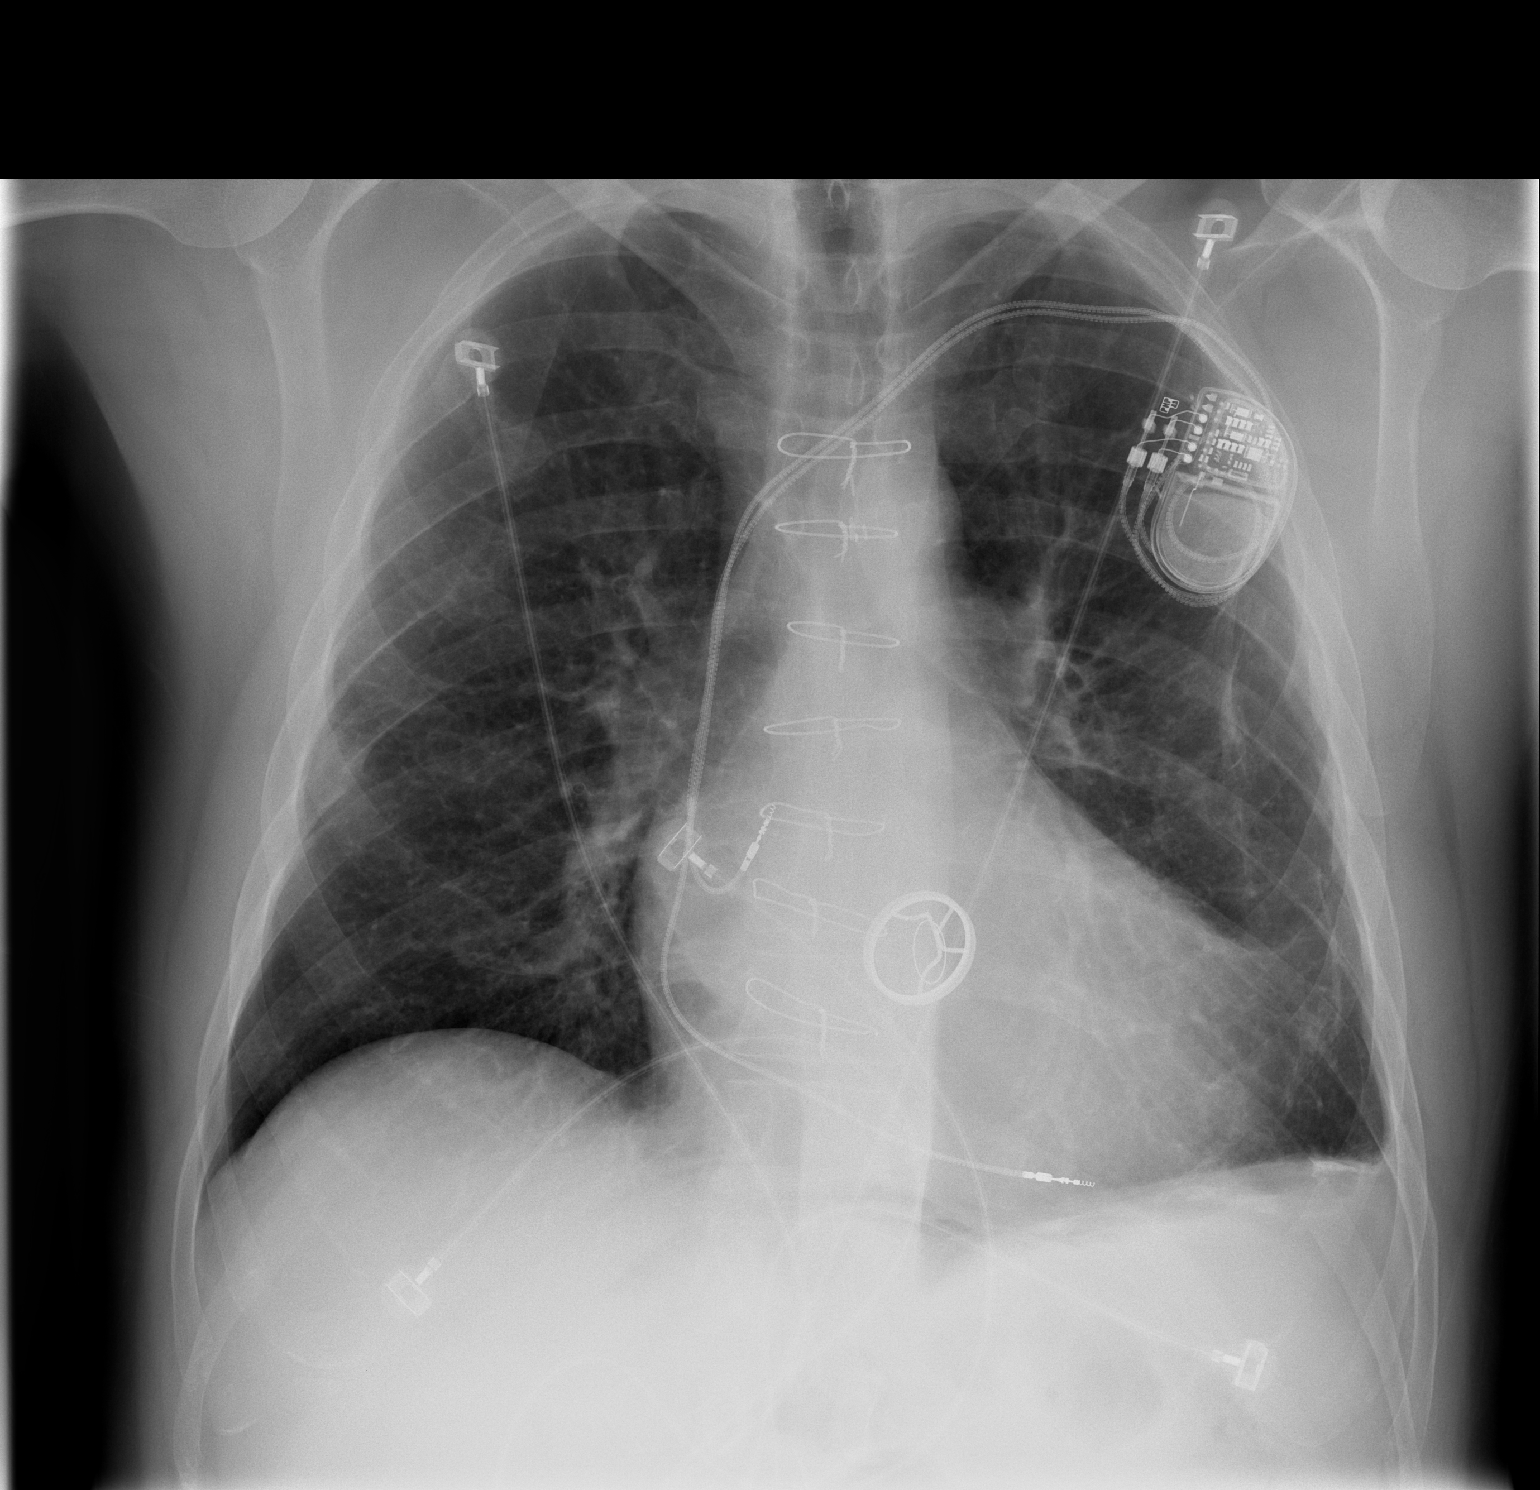

[w chest lat]
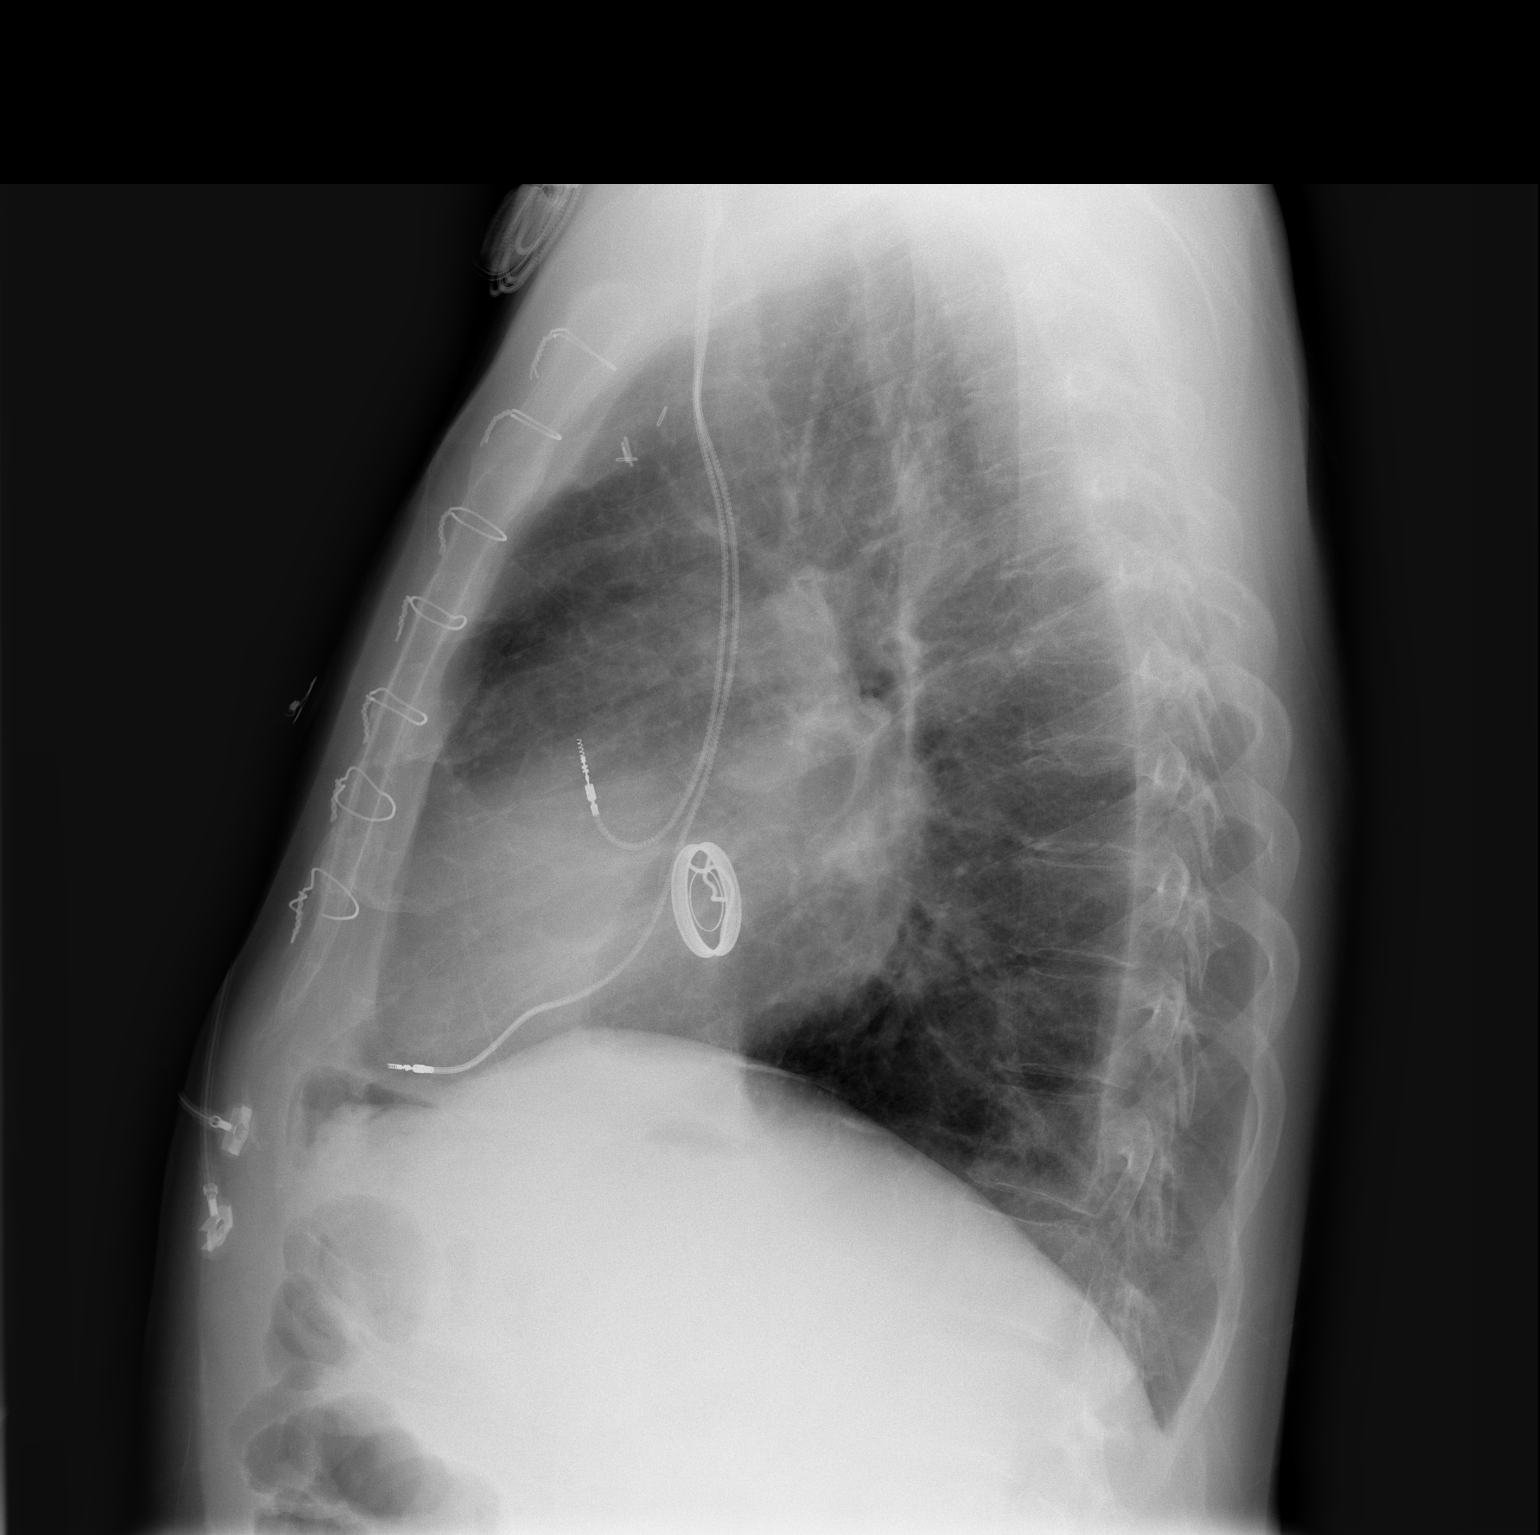

[2 of 2 positions shown; findings below may reference images not displayed]

FINDINGS: There is evidence of prosthetic mitral valve.  There is mild chronic cardiomegaly.  Dual lead pacer is in place.  The patient has scarring in both lungs with some chronic peribronchial thickening and multiple old well healed right rib fractures.
IMPRESSION: No acute abnormality.  Chronic changes in both lungs.

## 2007-03-25 IMAGING — CR DG CHEST 1V PORT
1 series · 1 of 1 positions shown · non-contrast
Comparison: 06/16/06.

CLINICAL DATA: Status post CABG.
 PORTABLE CHEST - 1 VIEW - 06/17/06:

[view not recorded]
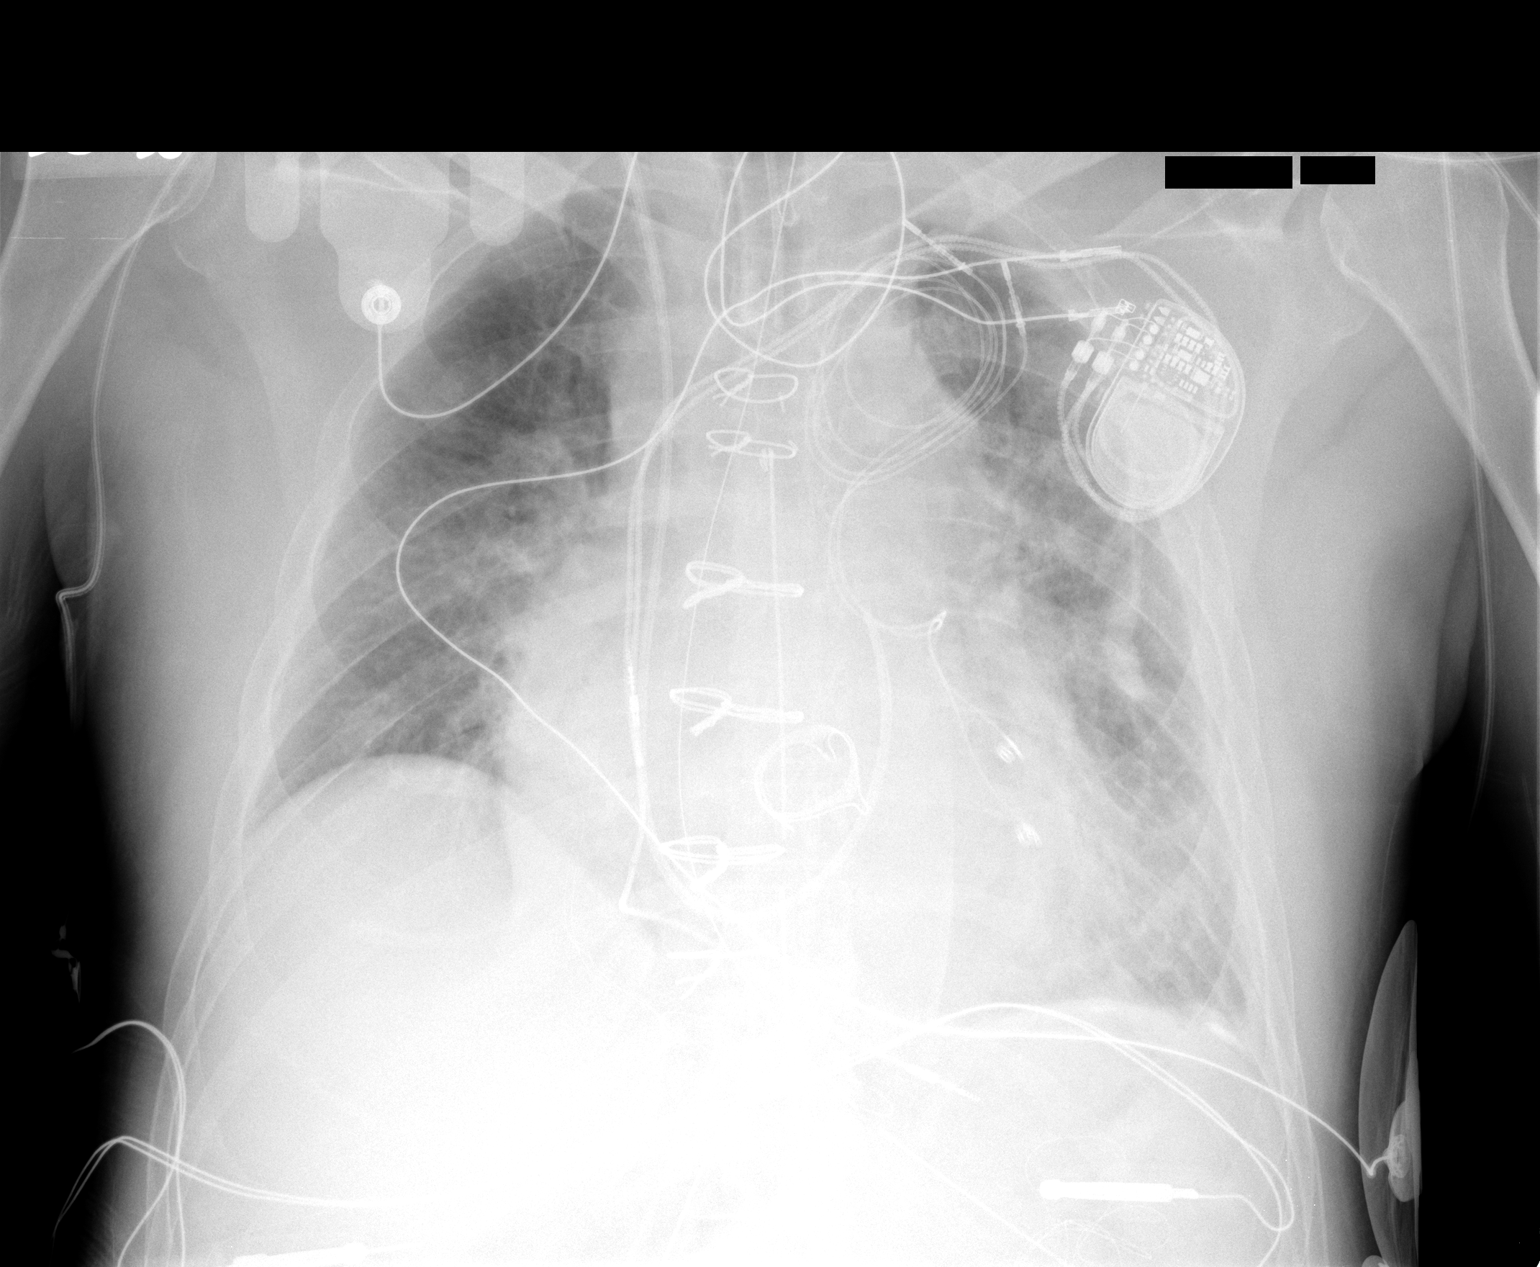

[1 of 1 positions shown; findings below may reference images not displayed]

FINDINGS: There is a left chest wall pacer with the leads in the right atrial appendage and right ventricle.  
 The patient is status post median sternotomy, CABG procedure, and replacement of the artificial mitral valve.  There is a mediastinal drain in place.  Epicardial pacer leads are noted.  An endotracheal tube is noted, the tip of which is above the carina.  There is a pulmonary arterial catheter with the tip in the pulmonary artery.  A nasogastric tube is noted with the side-port below the gastroesophageal junction.  The heart size is enlarged and there is moderate interstitial edema.  Scarring is again noted within the left lung.
IMPRESSION: 1.  Postoperative changes from median sternotomy, mitral valve replacement, and CABG procedure.
 2.  Support apparatus in good position.
 3.  Moderate pulmonary edema.

## 2007-03-26 IMAGING — CR DG CHEST 1V PORT
1 series · 1 of 1 positions shown · non-contrast
Comparison: 06/17/06.

CLINICAL DATA: Postop aortic valve replacement. Redo CABG.
 PORTABLE CHEST - 1 VIEW ? 06/18/06 AT 5266 HOURS:

[view not recorded]
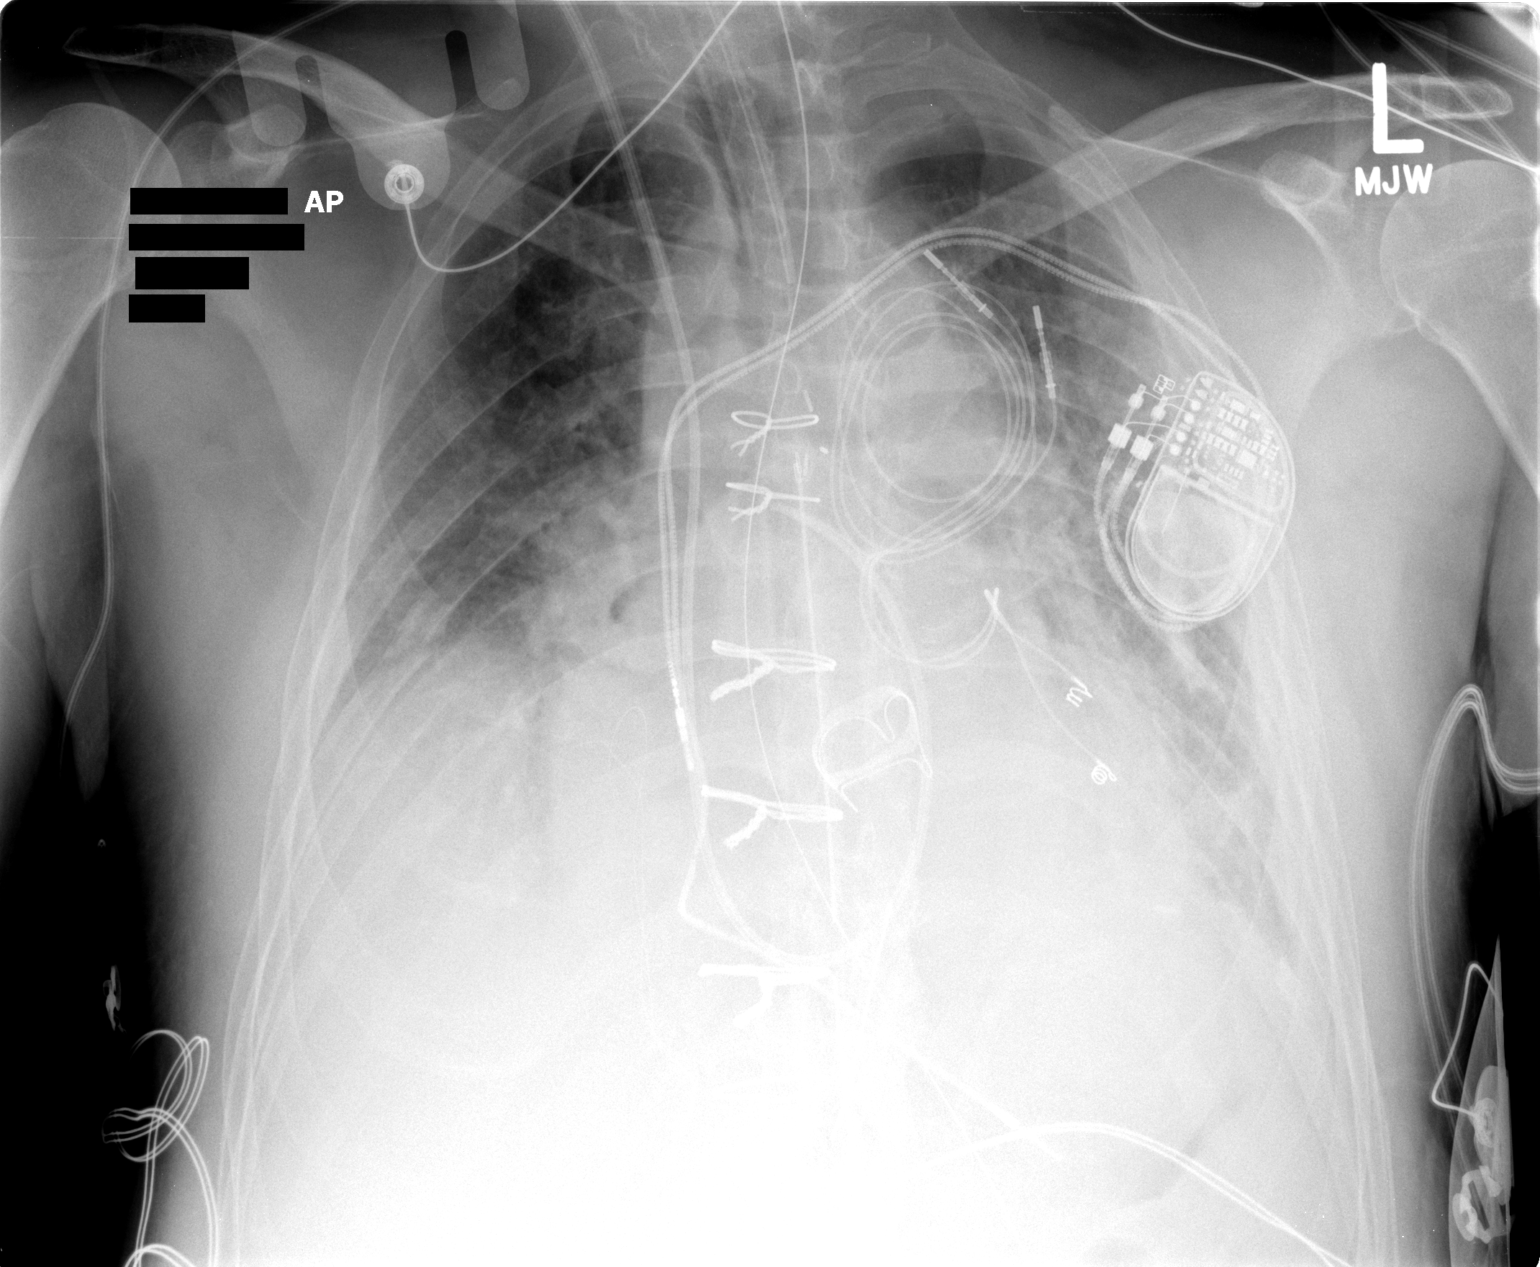

[1 of 1 positions shown; findings below may reference images not displayed]

FINDINGS: There is little change in cardiomegaly and moderate edema.  The endotracheal tube and Swan-Ganz catheter are unchanged and a permanent pacemaker remains.
IMPRESSION: No significant change in cardiomegaly and edema.

## 2007-03-26 IMAGING — CR DG CHEST 1V PORT
1 series · 1 of 1 positions shown · non-contrast
Comparison: Earlier today.

CLINICAL DATA: PICC line placement.

PORTABLE CHEST - 1 VIEW

[view not recorded]
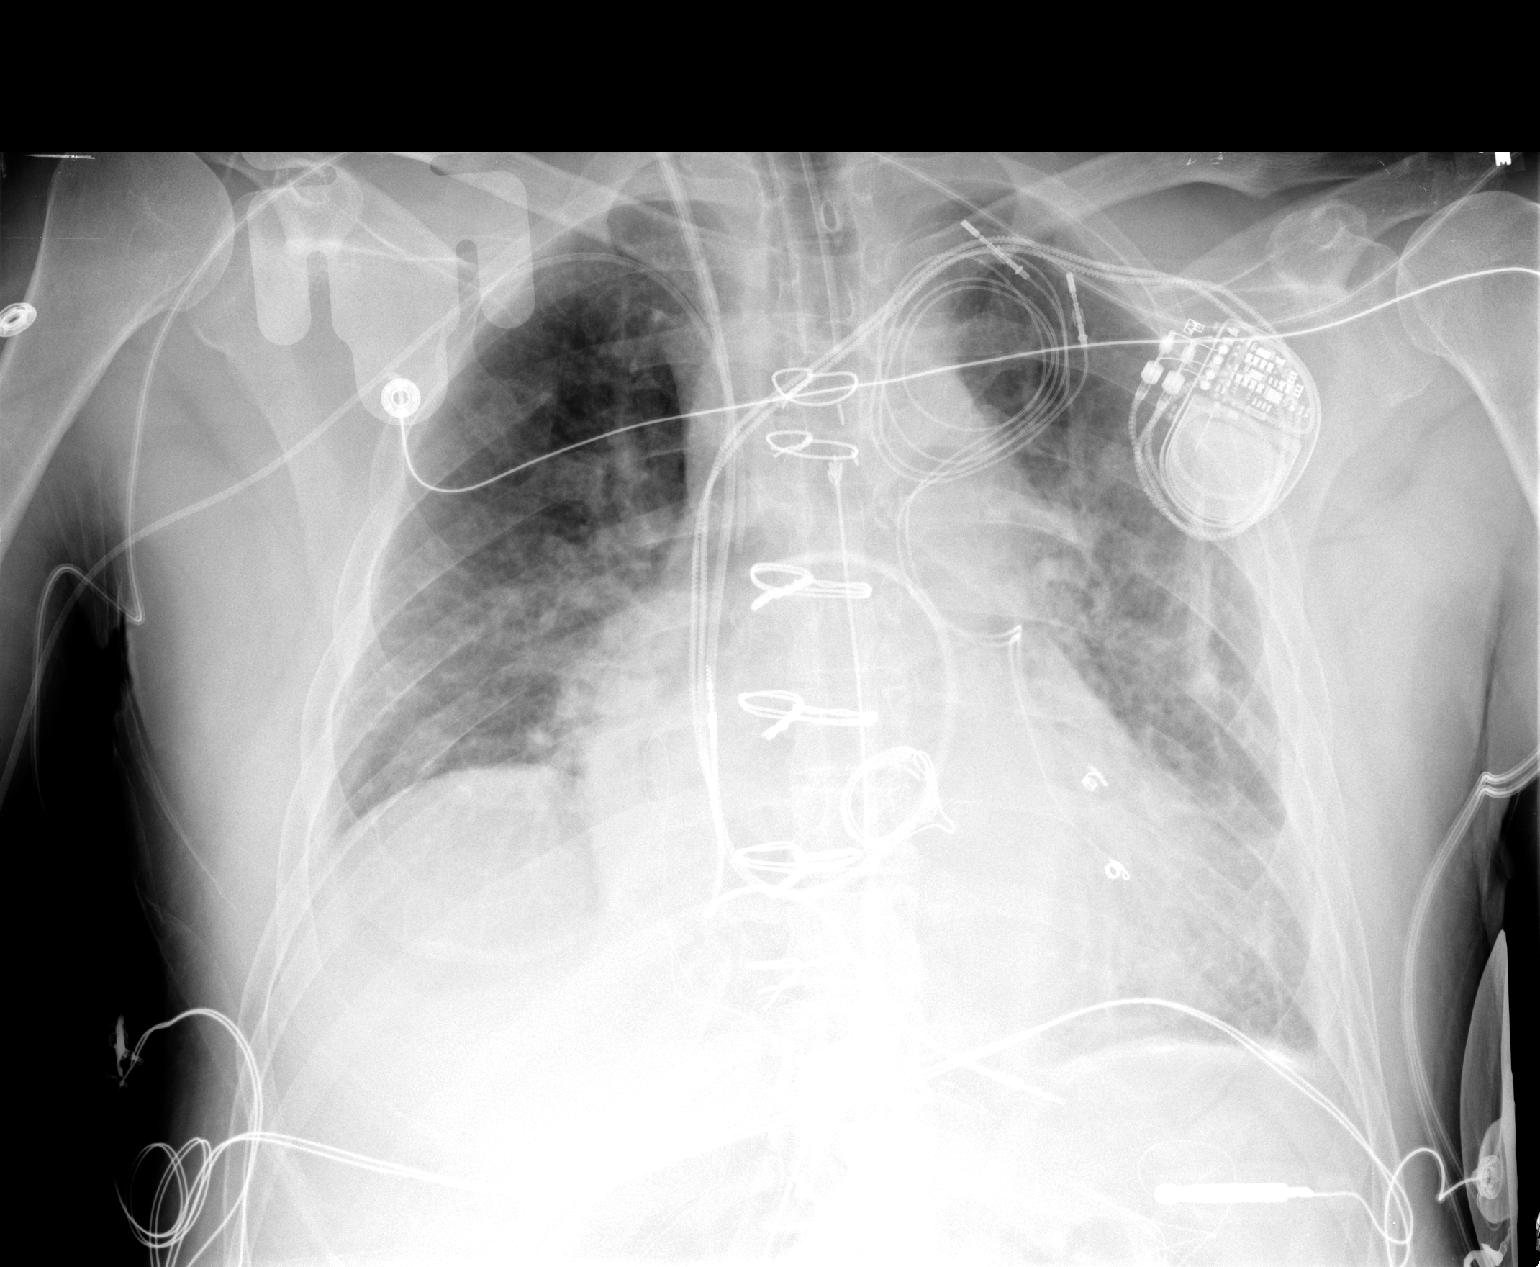

[1 of 1 positions shown; findings below may reference images not displayed]

FINDINGS: Interval right PICC with its tip in the superior vena cava. Stable
endotracheal and mediastinal tubes. Stable right jugular Swan-Ganz catheter.
Stable enlarged cardiac silhouette. Decreased bilateral airspace opacity with
mildly decreased prominence of the interstitial markings.  

IMPRESSION

1. Right PICC tip in the superior vena cava.
2. Improving changes of congestive heart failure.

## 2007-03-27 IMAGING — CR DG CHEST 1V PORT
1 series · 1 of 1 positions shown · non-contrast
Comparison: 06/18/06.

CLINICAL DATA: CVA.  Status-post CABG.
 PORTABLE CHEST - 1 VIEW:

[view not recorded]
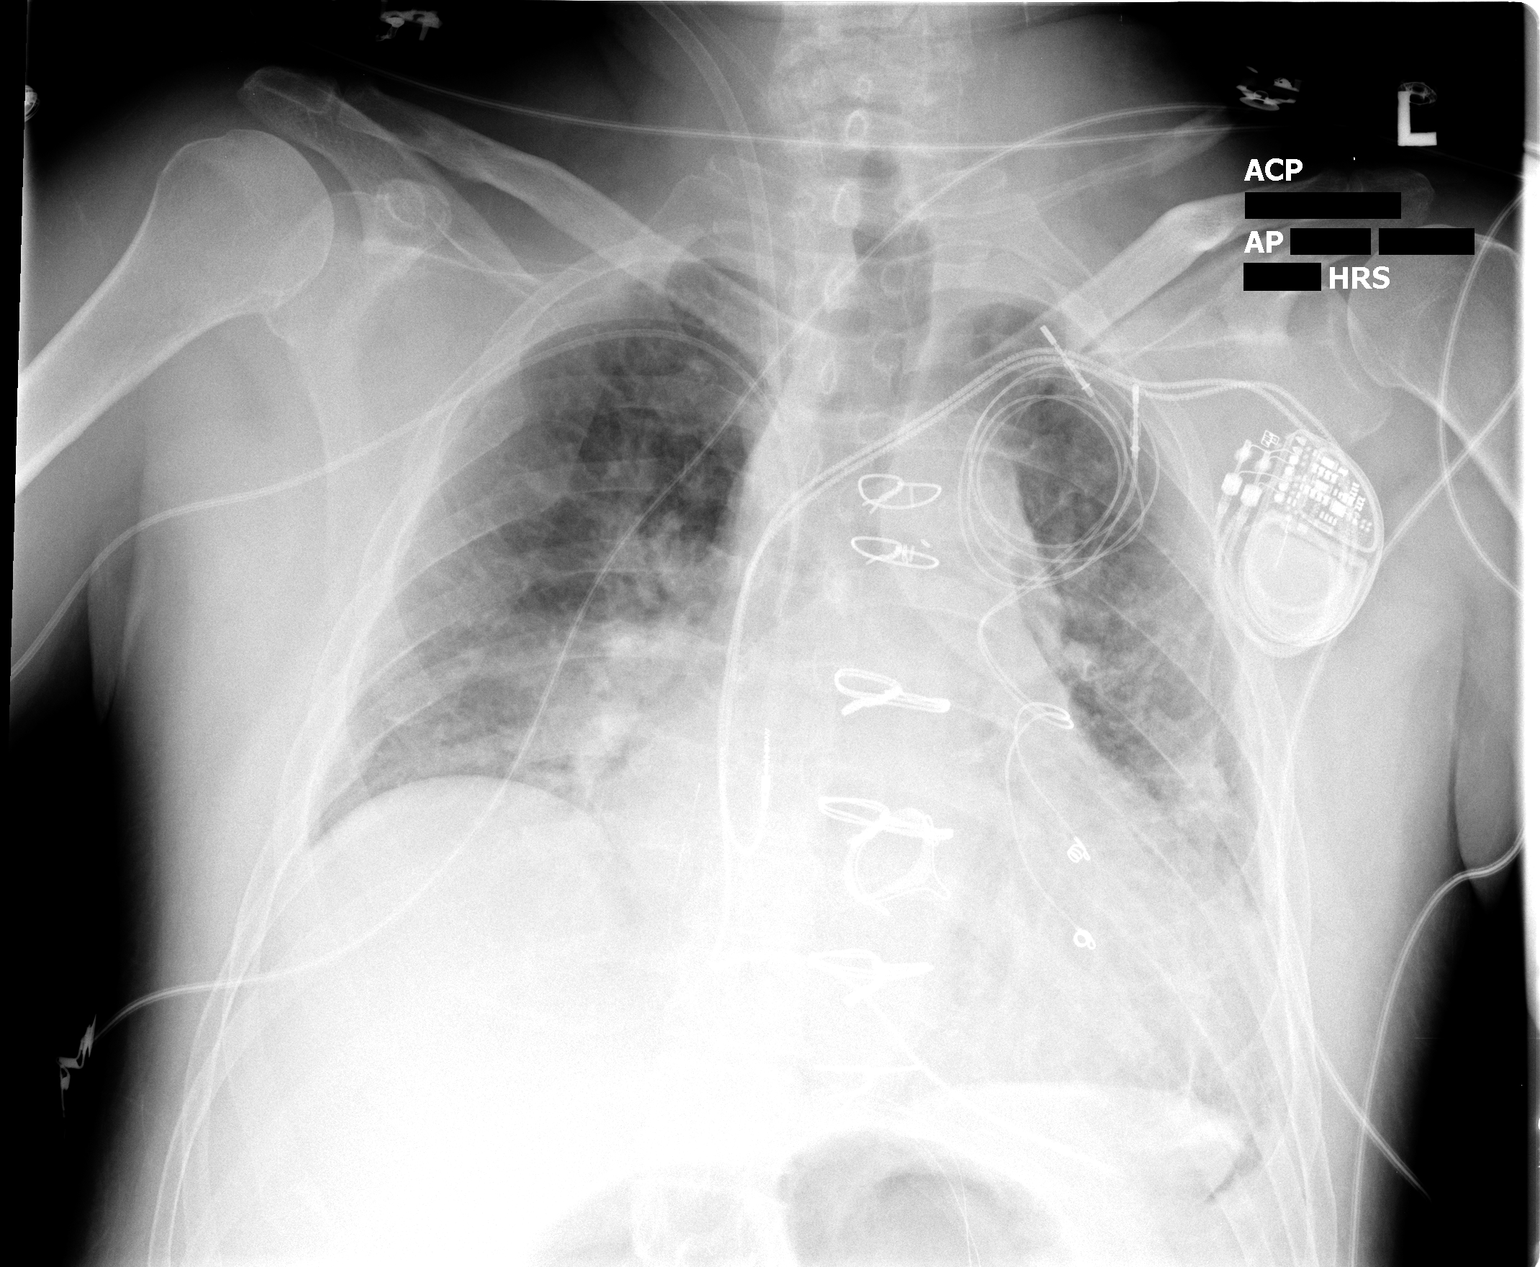

[1 of 1 positions shown; findings below may reference images not displayed]

FINDINGS: There is a right IJ catheter sheath in place. There has been removal of the pulmonary arterial catheter.  A right PICC line tip is in the SVC. There is a left chest wall pacer with leads in the right atrial appendage and right ventricle.  The heart size is enlarged.  The pulmonary edema pattern is unchanged. There are persistent patchy areas of atelectasis bilaterally.
IMPRESSION: 1.  No significant change in pulmonary edema. 
 2.  No complications status-post extubation.

## 2007-03-28 IMAGING — CR DG CHEST 1V PORT
1 series · 1 of 1 positions shown · non-contrast
Comparison: 06/19/06.

CLINICAL DATA: Stroke and status-post median sternotomy with revision of mitral valve replacement. 
 PORTABLE CHEST ([DATE]):

[view not recorded]
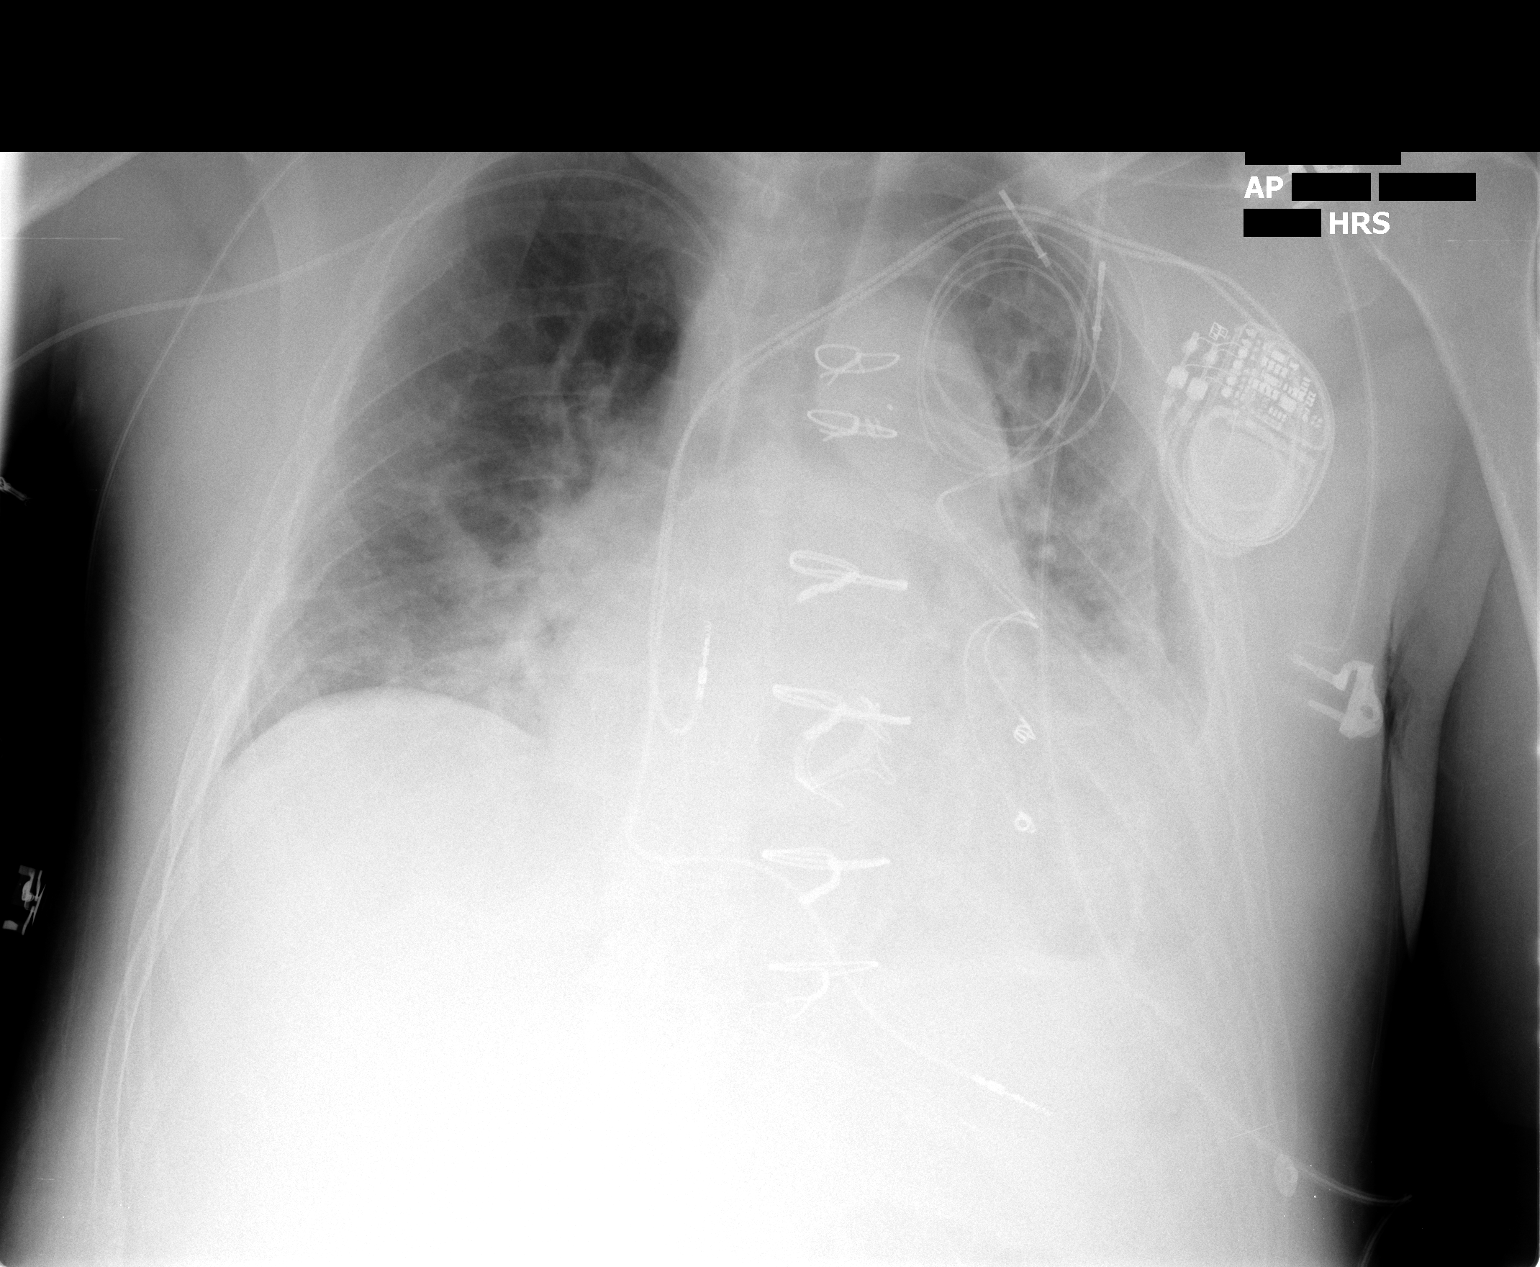

[1 of 1 positions shown; findings below may reference images not displayed]

FINDINGS: Stable bilateral lower lobe atelectasis since the prior study.  No overt edema.  No pneumothorax is seen.   Stable cardiomegaly.
IMPRESSION: Stable bilateral lower lobe atelectasis.

## 2007-03-30 IMAGING — CR DG CHEST 2V
2 series · 2 of 2 positions shown · non-contrast
Comparison: 06/20/06.

CLINICAL DATA: CVA.  Status post CABG.  Soreness.
 CHEST - 2 VIEW:

[w chest pa]
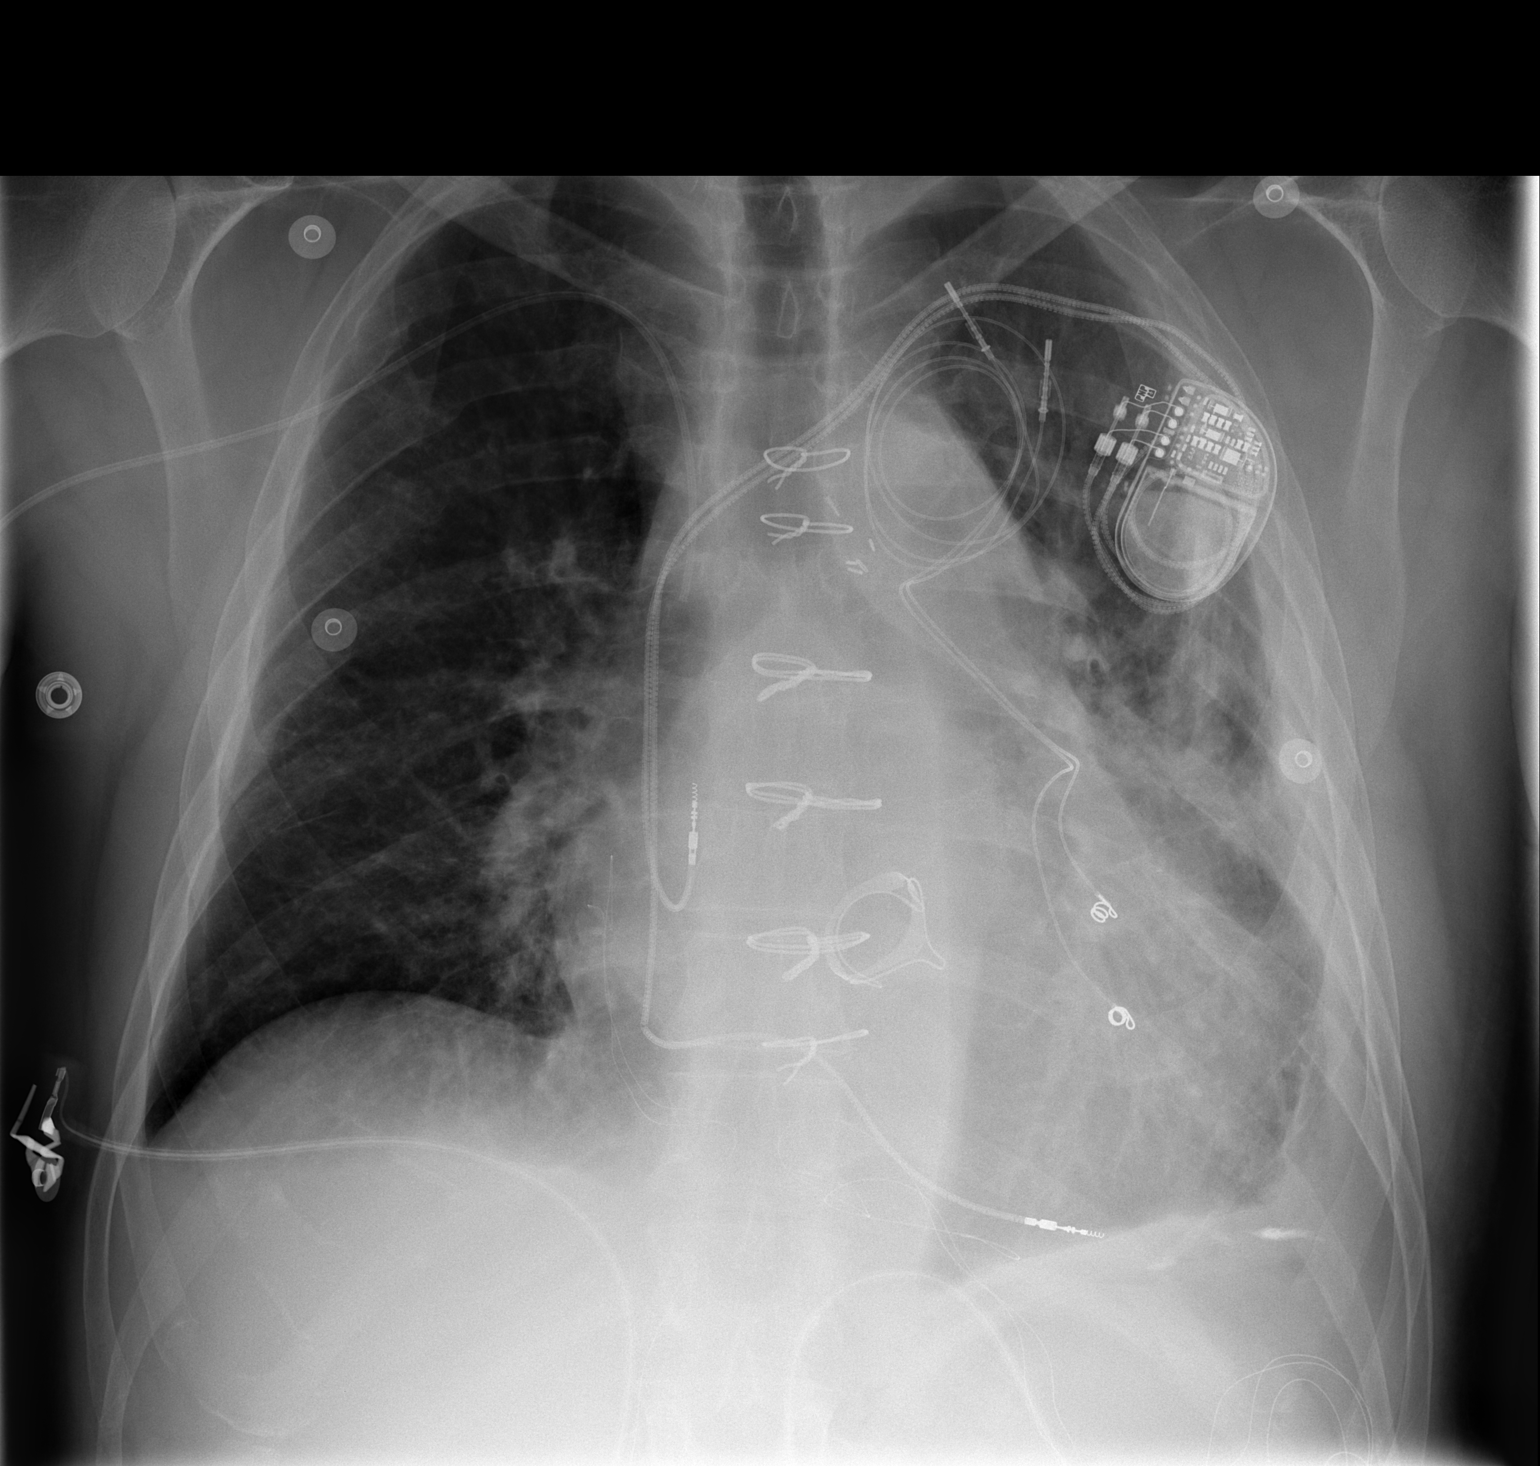

[w chest lat]
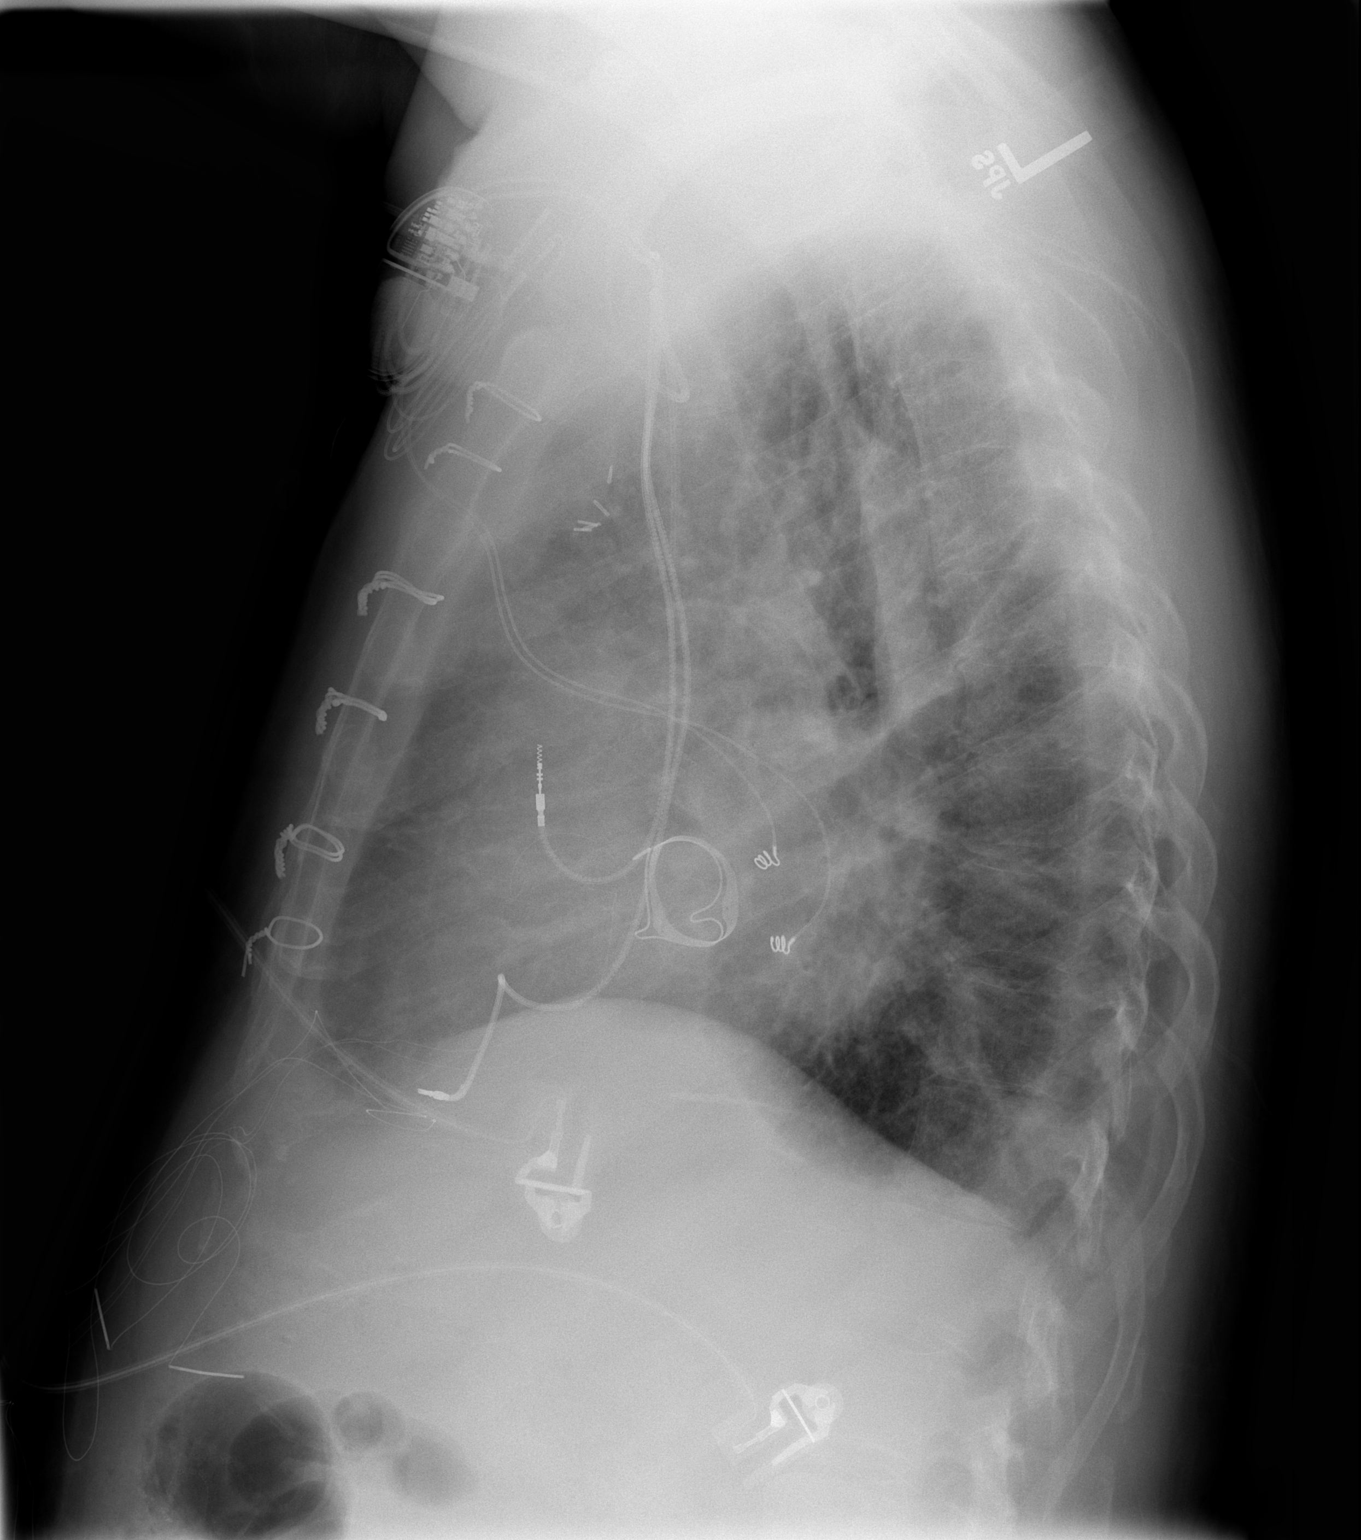

[2 of 2 positions shown; findings below may reference images not displayed]

FINDINGS: There is a left chest wall pacer with leads over the right atrial appendage and right ventricle.  There is a prosthetic mitral valve.  Right arm PICC line tip is in the SVC.  Cardiac pacer leads are stable.  Left effusion is unchanged.  There is scarring and atelectasis in the left lung.  When compared with the prior exam, the lungs are better inflated with improving atelectasis.
IMPRESSION: 1.  Improved aeration to lungs.
 2.  Stable left effusion.

## 2007-04-08 IMAGING — CR DG CHEST 2V
2 series · 2 of 2 positions shown · non-contrast
Comparison: 06/29/06.

CLINICAL DATA: CABG.  Short of breath.
 CHEST - TWO VIEWS:

[w chest pa]
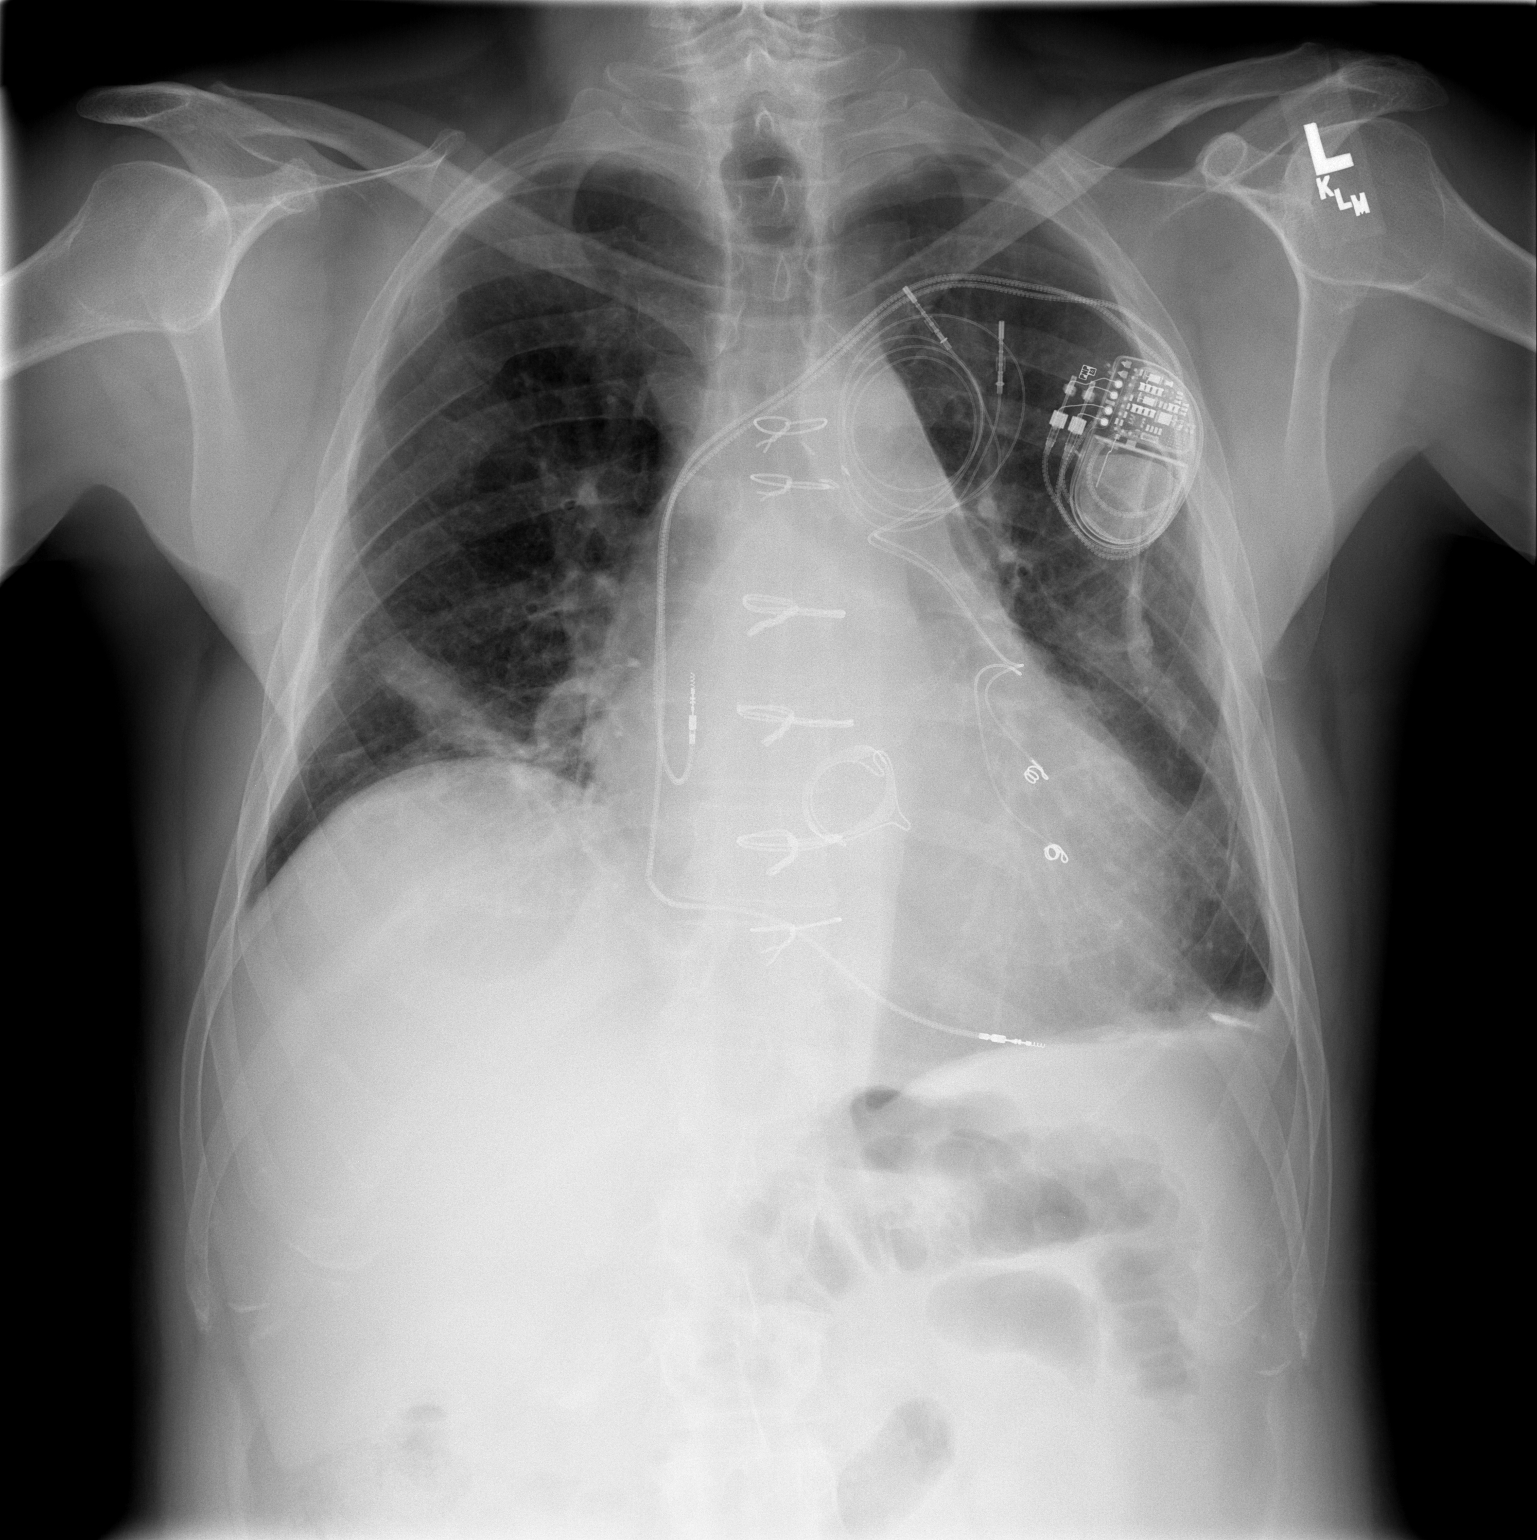

[w chest lat]
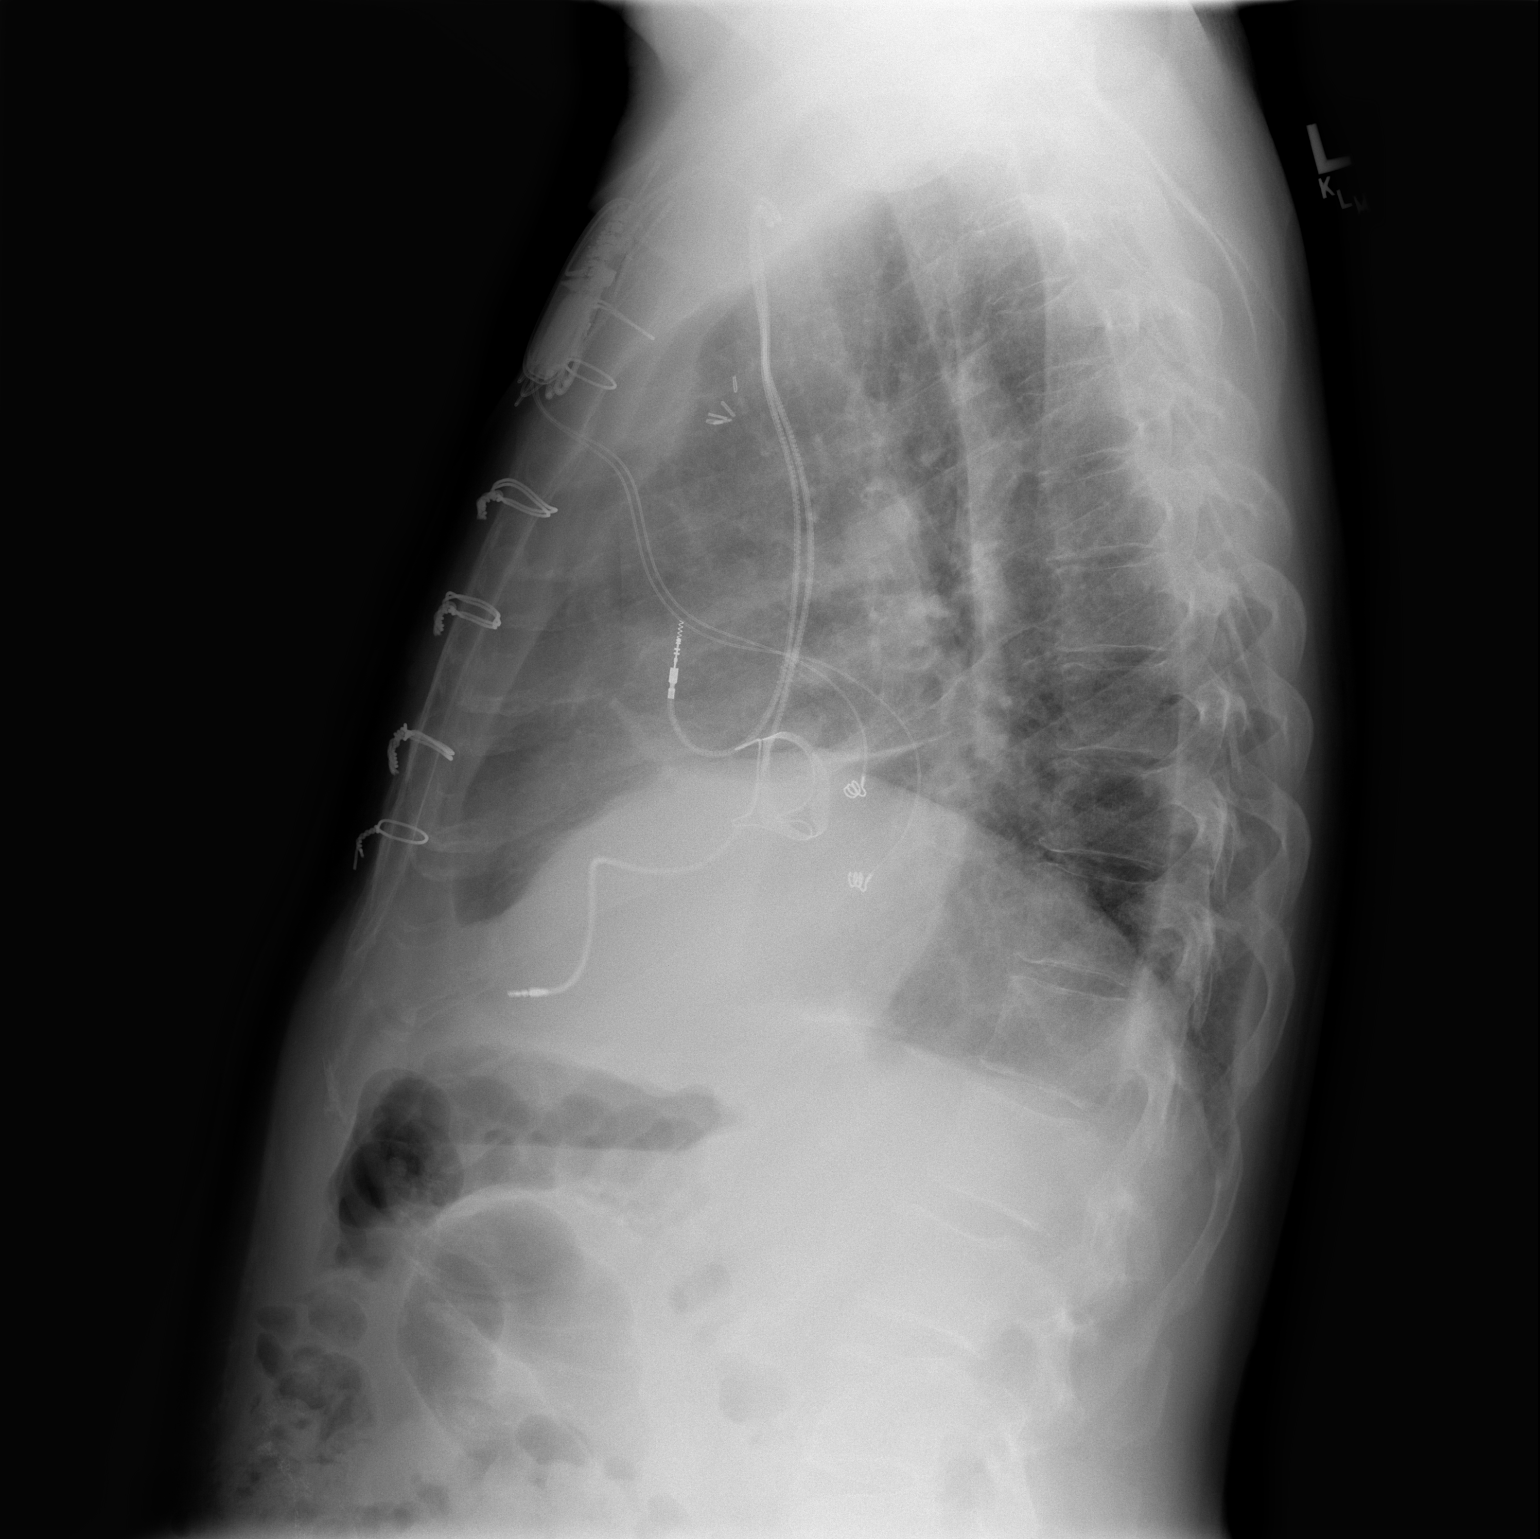

[2 of 2 positions shown; findings below may reference images not displayed]

Two views of the chest show improved aeration with residual right basilar linear atelectasis.  Minimally blunted left costophrenic angle is stable.   Cardiomegaly is unchanged, and permanent pacemaker remains.
IMPRESSION: Improved aeration with residual linear atelectasis of the right base.  Stable cardiomegaly with pacer.

## 2007-04-27 IMAGING — CR DG CHEST 2V
2 series · 2 of 2 positions shown · non-contrast
Comparison: none

CLINICAL DATA: Chest pain.
 CHEST ? 2 VIEW:

[view not recorded (1 of 2)]
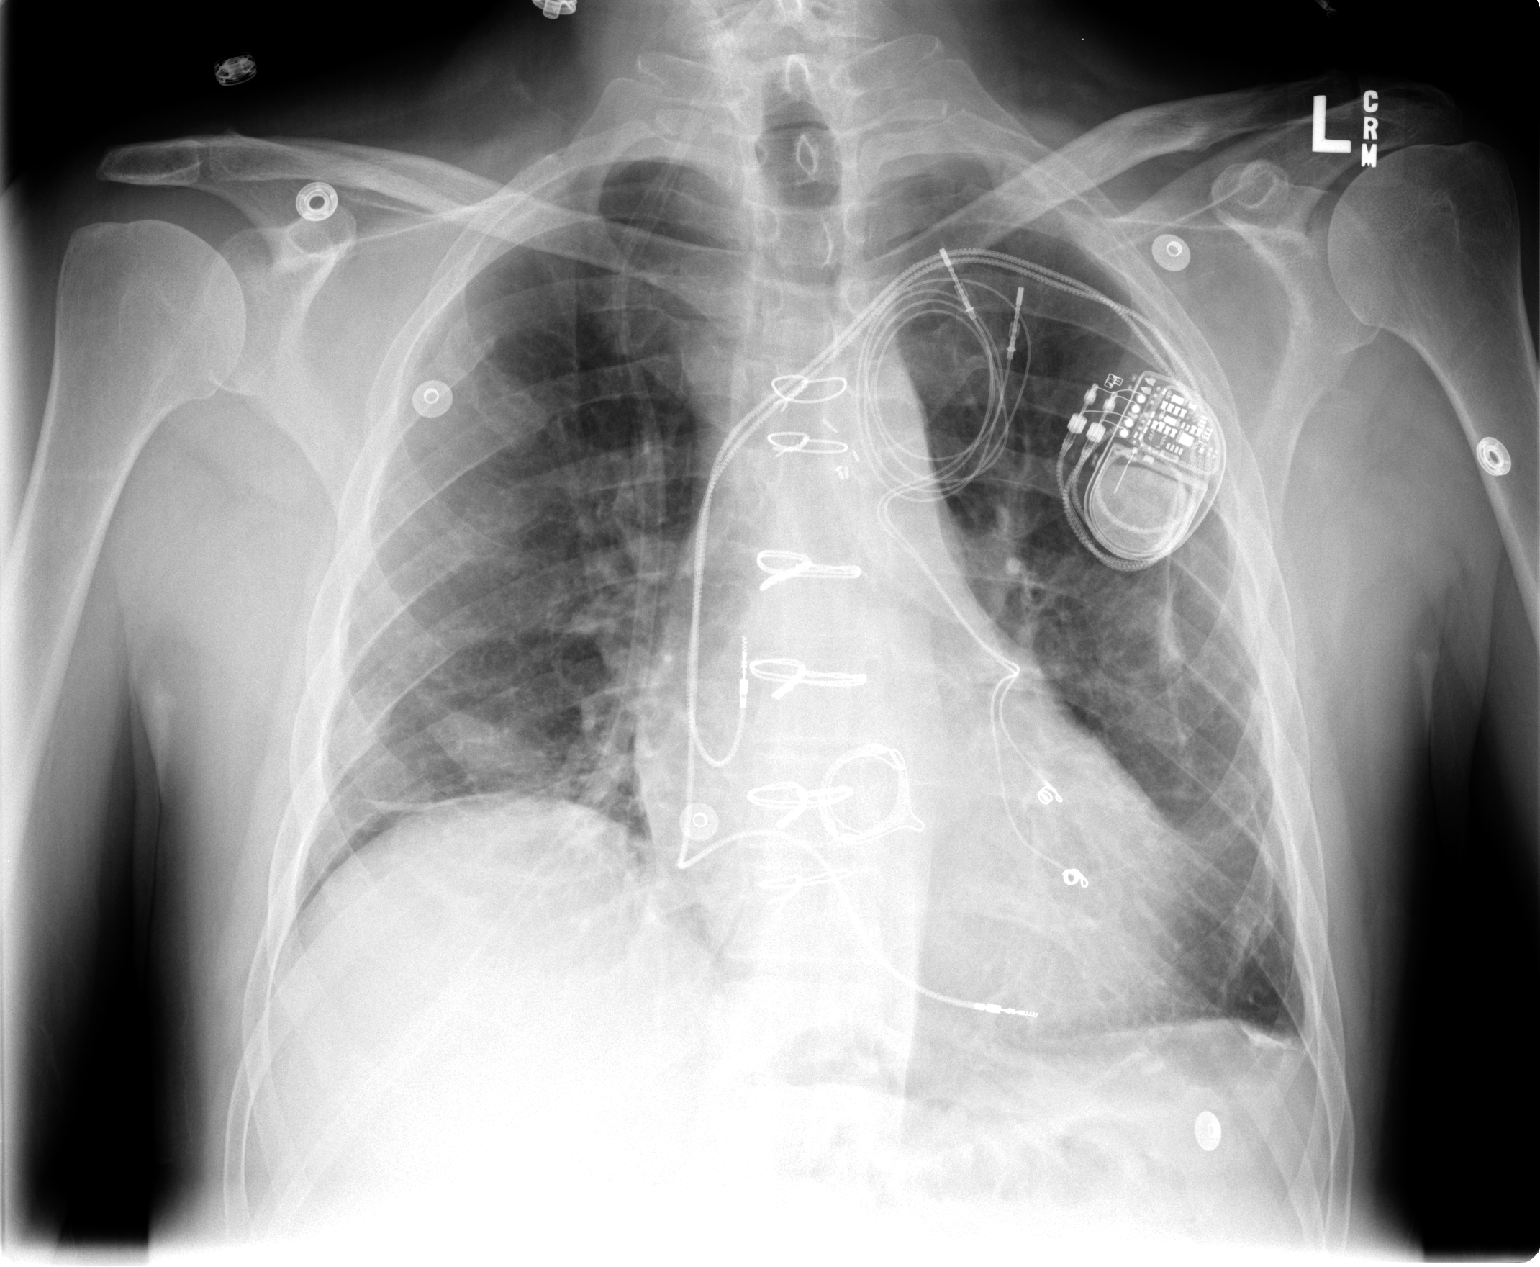

[view not recorded (2 of 2)]
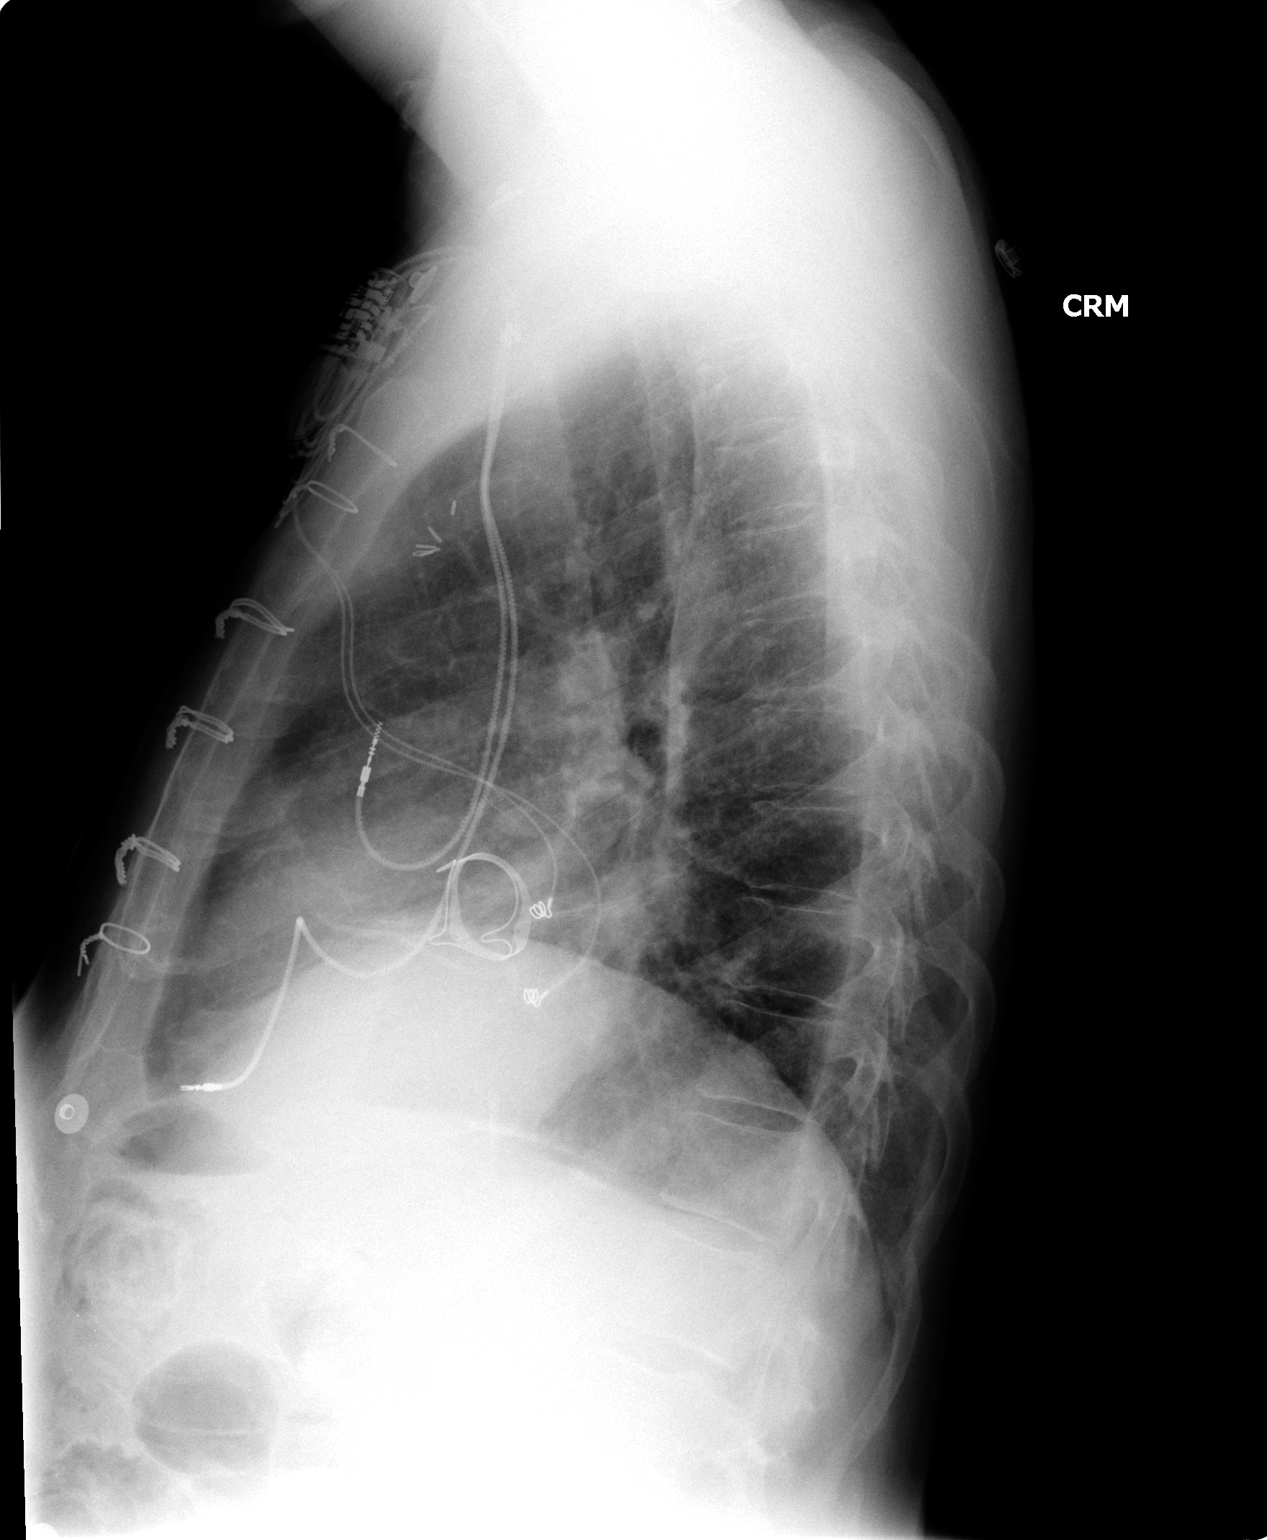

[2 of 2 positions shown; findings below may reference images not displayed]

FINDINGS: The patient is status post CABG and aortic valve replacement.  Dual lead pacing device is in place and unchanged.  There is some scattered atelectatic change.  No edema or effusion.  Heart size is upper normal.  Remote right rib fractures are noted.
IMPRESSION: Scattered mild atelectasis.  Negative for edema or pneumonia.

## 2007-05-02 ENCOUNTER — Inpatient Hospital Stay (HOSPITAL_COMMUNITY): Admission: EM | Admit: 2007-05-02 | Discharge: 2007-05-04 | Payer: Self-pay | Admitting: Emergency Medicine

## 2007-06-18 IMAGING — CR DG CHEST 2V
2 series · 2 of 2 positions shown · non-contrast
Comparison: 07/20/06.

CLINICAL DATA: Pneumonia.  New cardiac valve placed Thursday June, 2006.
 TWO VIEW CHEST:

[view not recorded (1 of 2)]
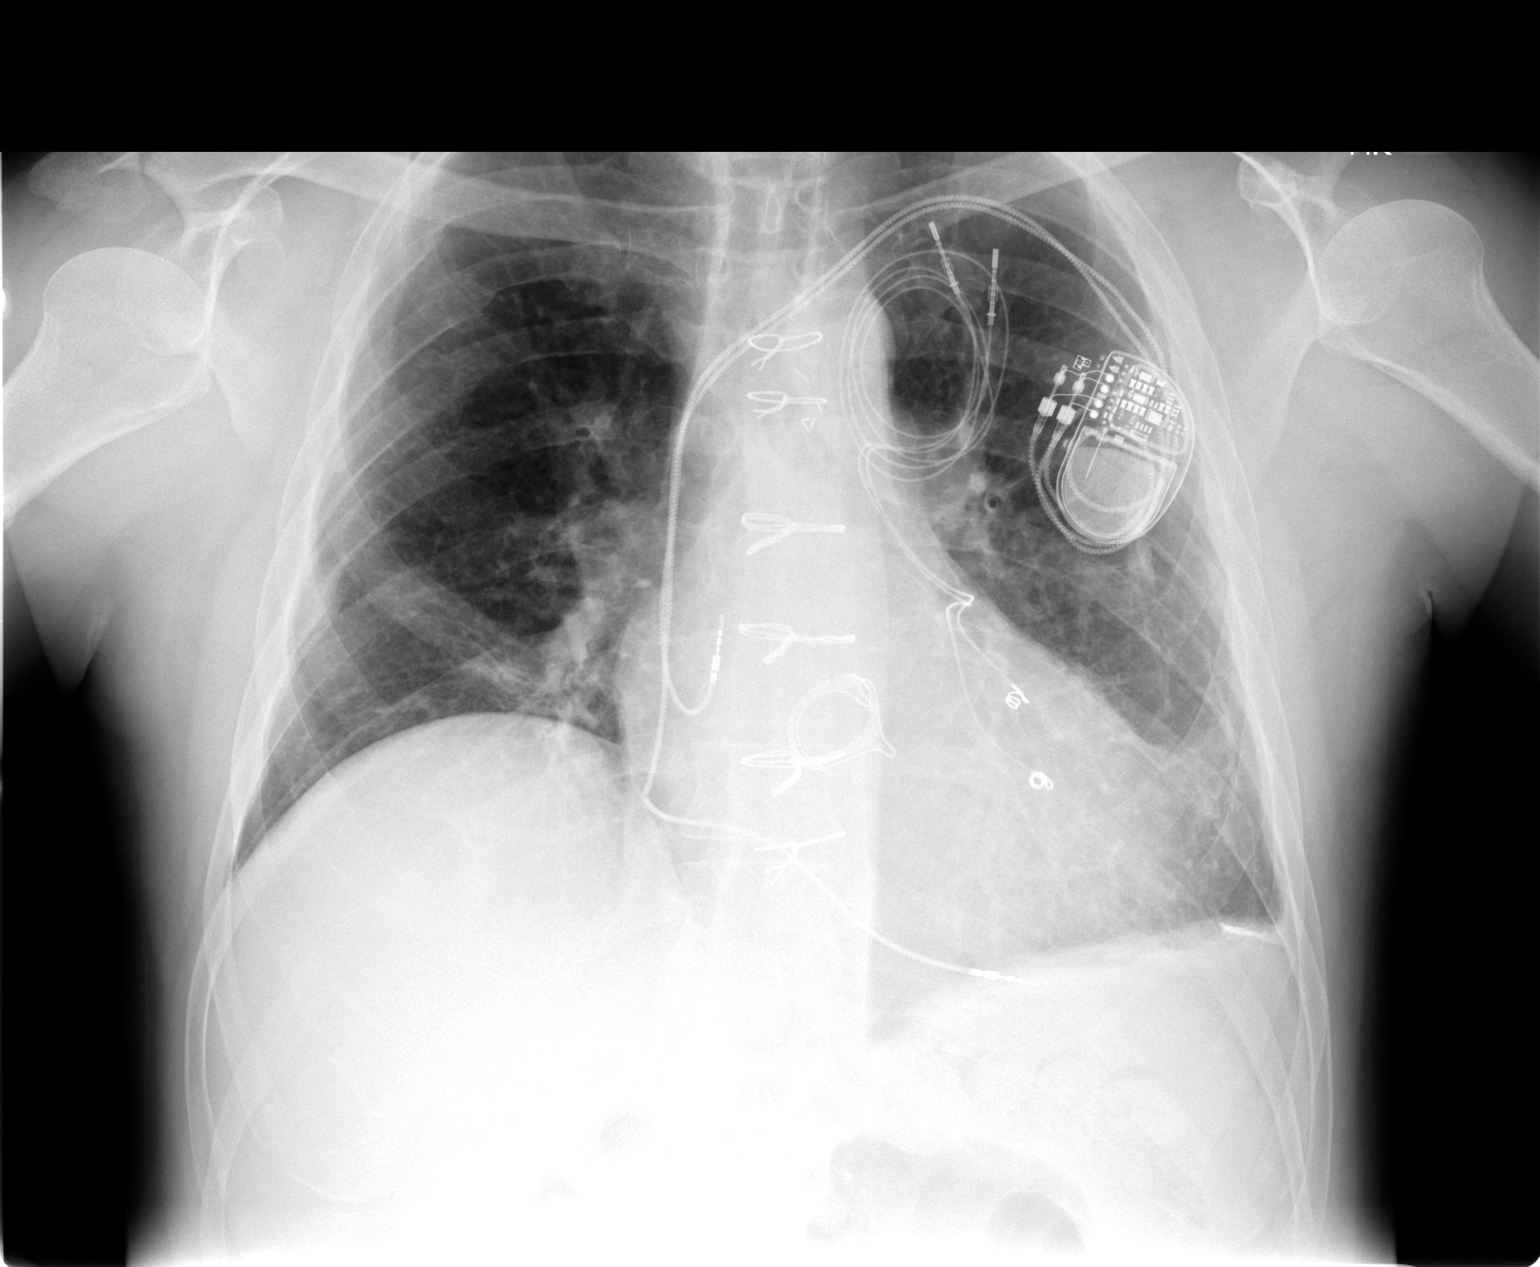

[view not recorded (2 of 2)]
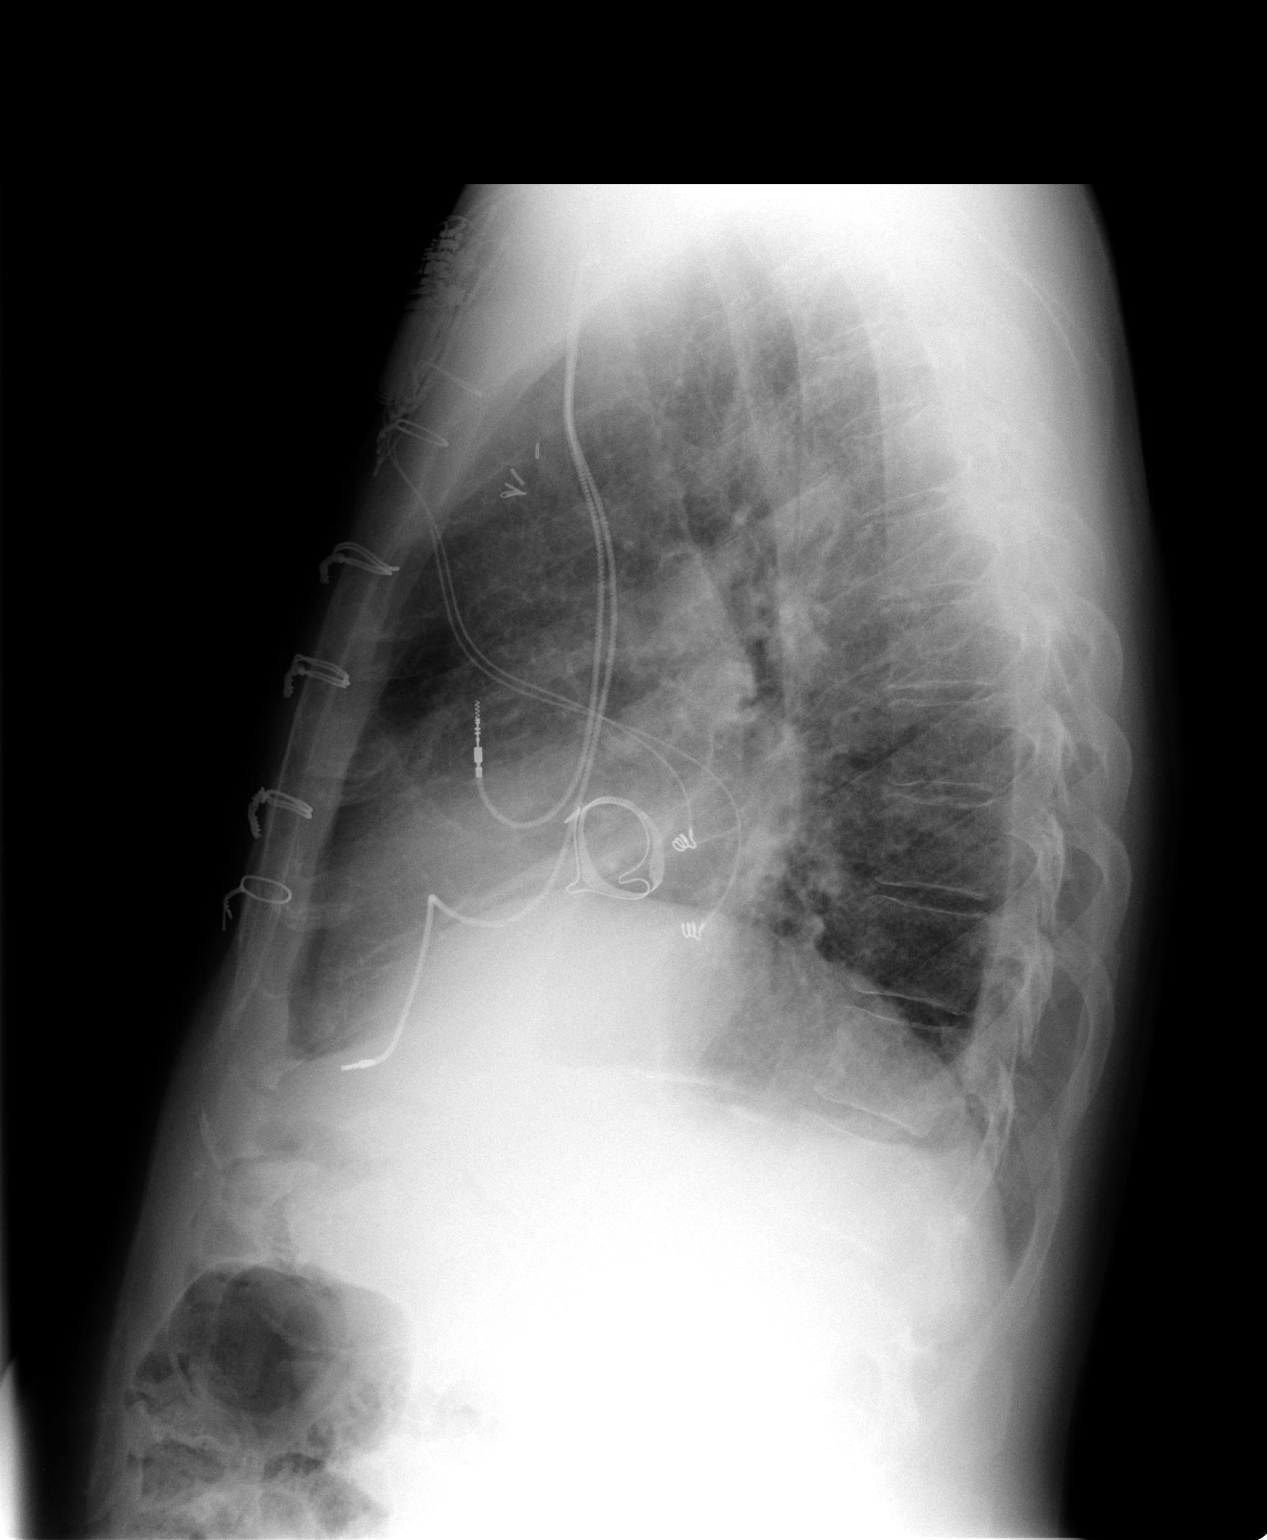

[2 of 2 positions shown; findings below may reference images not displayed]

Two views of the chest show only mild basilar linear atelectasis.  No infiltrate or effusion is seen.  Cardiomegaly is stable.  Permanent pacemaker remains.
IMPRESSION: Mild basilar atelectasis.  Pacer remains.

## 2007-07-15 ENCOUNTER — Inpatient Hospital Stay (HOSPITAL_COMMUNITY): Admission: EM | Admit: 2007-07-15 | Discharge: 2007-07-20 | Payer: Self-pay | Admitting: Emergency Medicine

## 2007-07-15 ENCOUNTER — Encounter: Admission: RE | Admit: 2007-07-15 | Discharge: 2007-07-15 | Payer: Self-pay | Admitting: Family Medicine

## 2007-07-15 ENCOUNTER — Encounter (INDEPENDENT_AMBULATORY_CARE_PROVIDER_SITE_OTHER): Payer: Self-pay | Admitting: Internal Medicine

## 2007-07-18 ENCOUNTER — Encounter (INDEPENDENT_AMBULATORY_CARE_PROVIDER_SITE_OTHER): Payer: Self-pay | Admitting: Emergency Medicine

## 2007-07-31 ENCOUNTER — Inpatient Hospital Stay (HOSPITAL_COMMUNITY): Admission: EM | Admit: 2007-07-31 | Discharge: 2007-08-06 | Payer: Self-pay | Admitting: Emergency Medicine

## 2007-07-31 ENCOUNTER — Ambulatory Visit: Payer: Self-pay | Admitting: Cardiology

## 2007-08-02 IMAGING — CR DG CHEST 2V
2 series · 2 of 2 positions shown · non-contrast
Comparison: 09/10/06.

CLINICAL DATA: 61 year-old-male with chest pain and shortness of breath. 
CHEST - 2 VIEW:

[w chest pa]
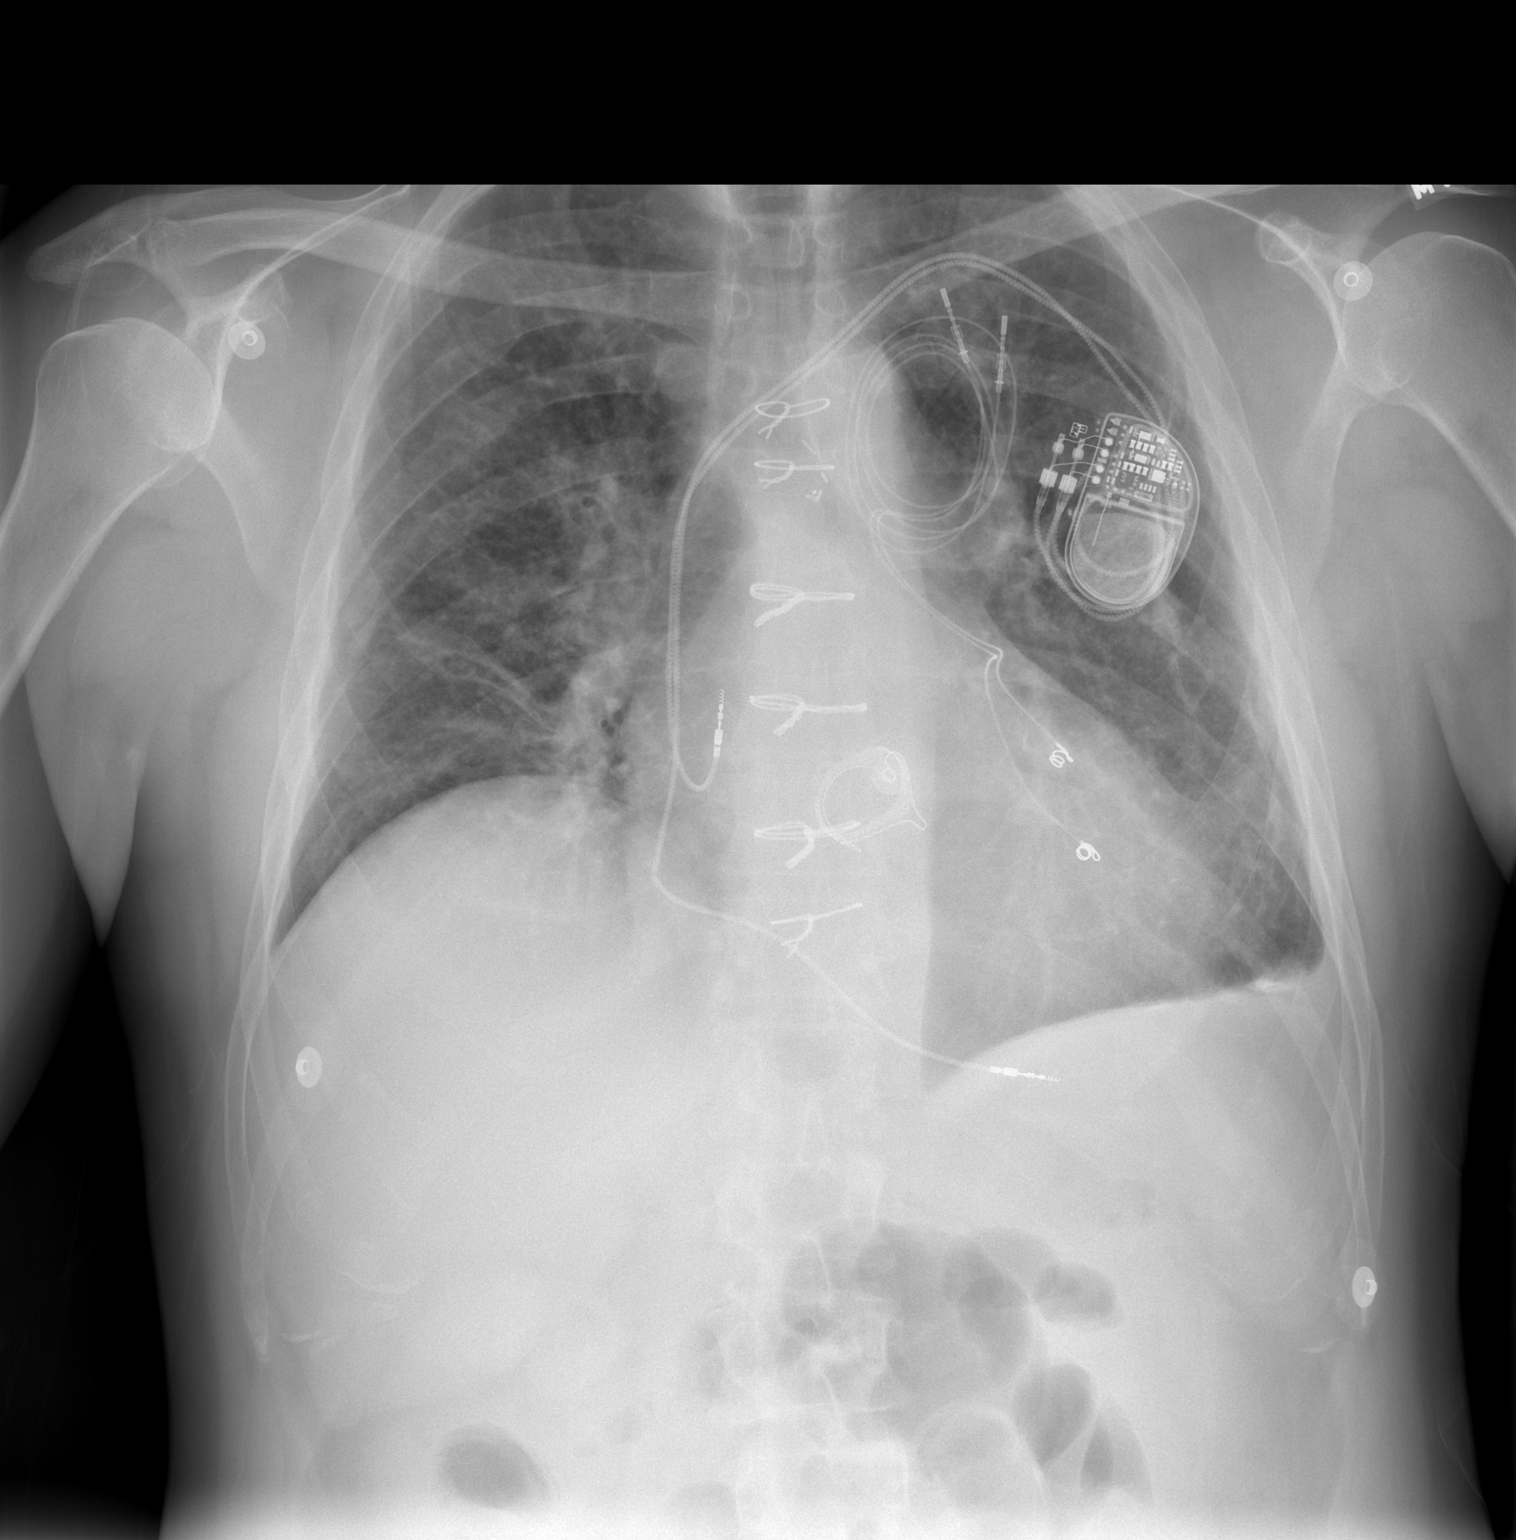

[w chest lat]
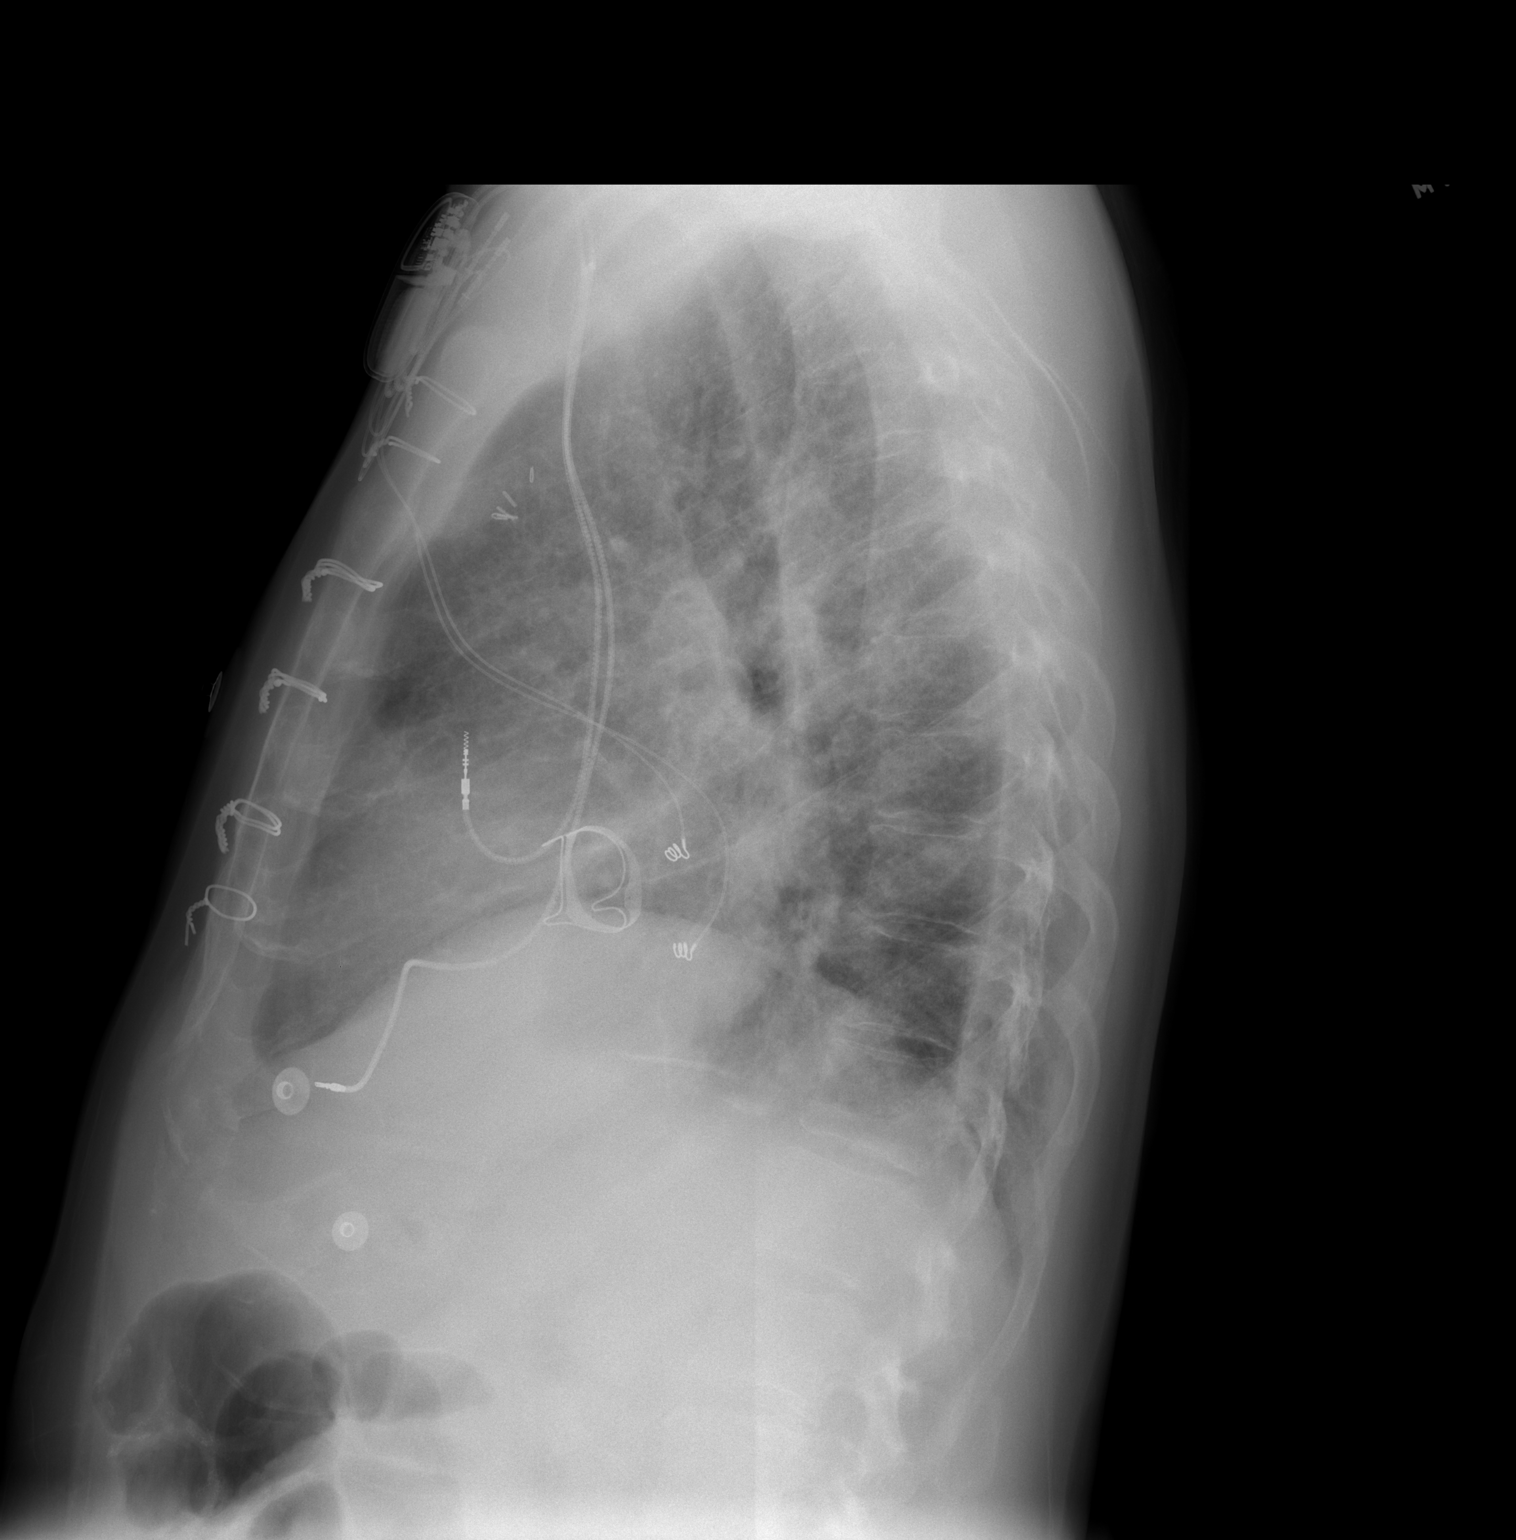

[2 of 2 positions shown; findings below may reference images not displayed]

FINDINGS: Two-lead left subclavian pacemaker is noted.  The patient is status-post median sternotomy for prosthetic heart valve.   Chronic bronchial thickening is noted with bibasilar scarring and atelectasis.  Vascular congestion is present.  Remote healed posterior right rib fractures.  No pneumothorax.
IMPRESSION: 1.  Chronic bronchial thickening, atelectasis and pleuroparenchymal scarring. 
2.  Cardiac enlargement with vascular congestion.  
3.  Chronically elevated right hemidiaphragm.

## 2007-09-29 ENCOUNTER — Encounter: Admission: RE | Admit: 2007-09-29 | Discharge: 2007-09-29 | Payer: Self-pay | Admitting: Family Medicine

## 2007-10-03 ENCOUNTER — Emergency Department (HOSPITAL_COMMUNITY): Admission: EM | Admit: 2007-10-03 | Discharge: 2007-10-04 | Payer: Self-pay | Admitting: Emergency Medicine

## 2007-11-21 ENCOUNTER — Encounter: Admission: RE | Admit: 2007-11-21 | Discharge: 2007-11-21 | Payer: Self-pay | Admitting: Cardiology

## 2007-11-23 ENCOUNTER — Inpatient Hospital Stay (HOSPITAL_COMMUNITY): Admission: RE | Admit: 2007-11-23 | Discharge: 2007-11-25 | Payer: Self-pay | Admitting: Cardiology

## 2007-11-24 ENCOUNTER — Encounter (INDEPENDENT_AMBULATORY_CARE_PROVIDER_SITE_OTHER): Payer: Self-pay | Admitting: Cardiology

## 2007-12-02 ENCOUNTER — Inpatient Hospital Stay (HOSPITAL_COMMUNITY): Admission: EM | Admit: 2007-12-02 | Discharge: 2007-12-09 | Payer: Self-pay | Admitting: Emergency Medicine

## 2007-12-02 ENCOUNTER — Ambulatory Visit: Payer: Self-pay | Admitting: Internal Medicine

## 2007-12-30 IMAGING — CR DG ABDOMEN 2V
2 series · 2 of 2 positions shown · non-contrast
Comparison: none

CLINICAL DATA: Abdominal pain

[w abdomen upright]
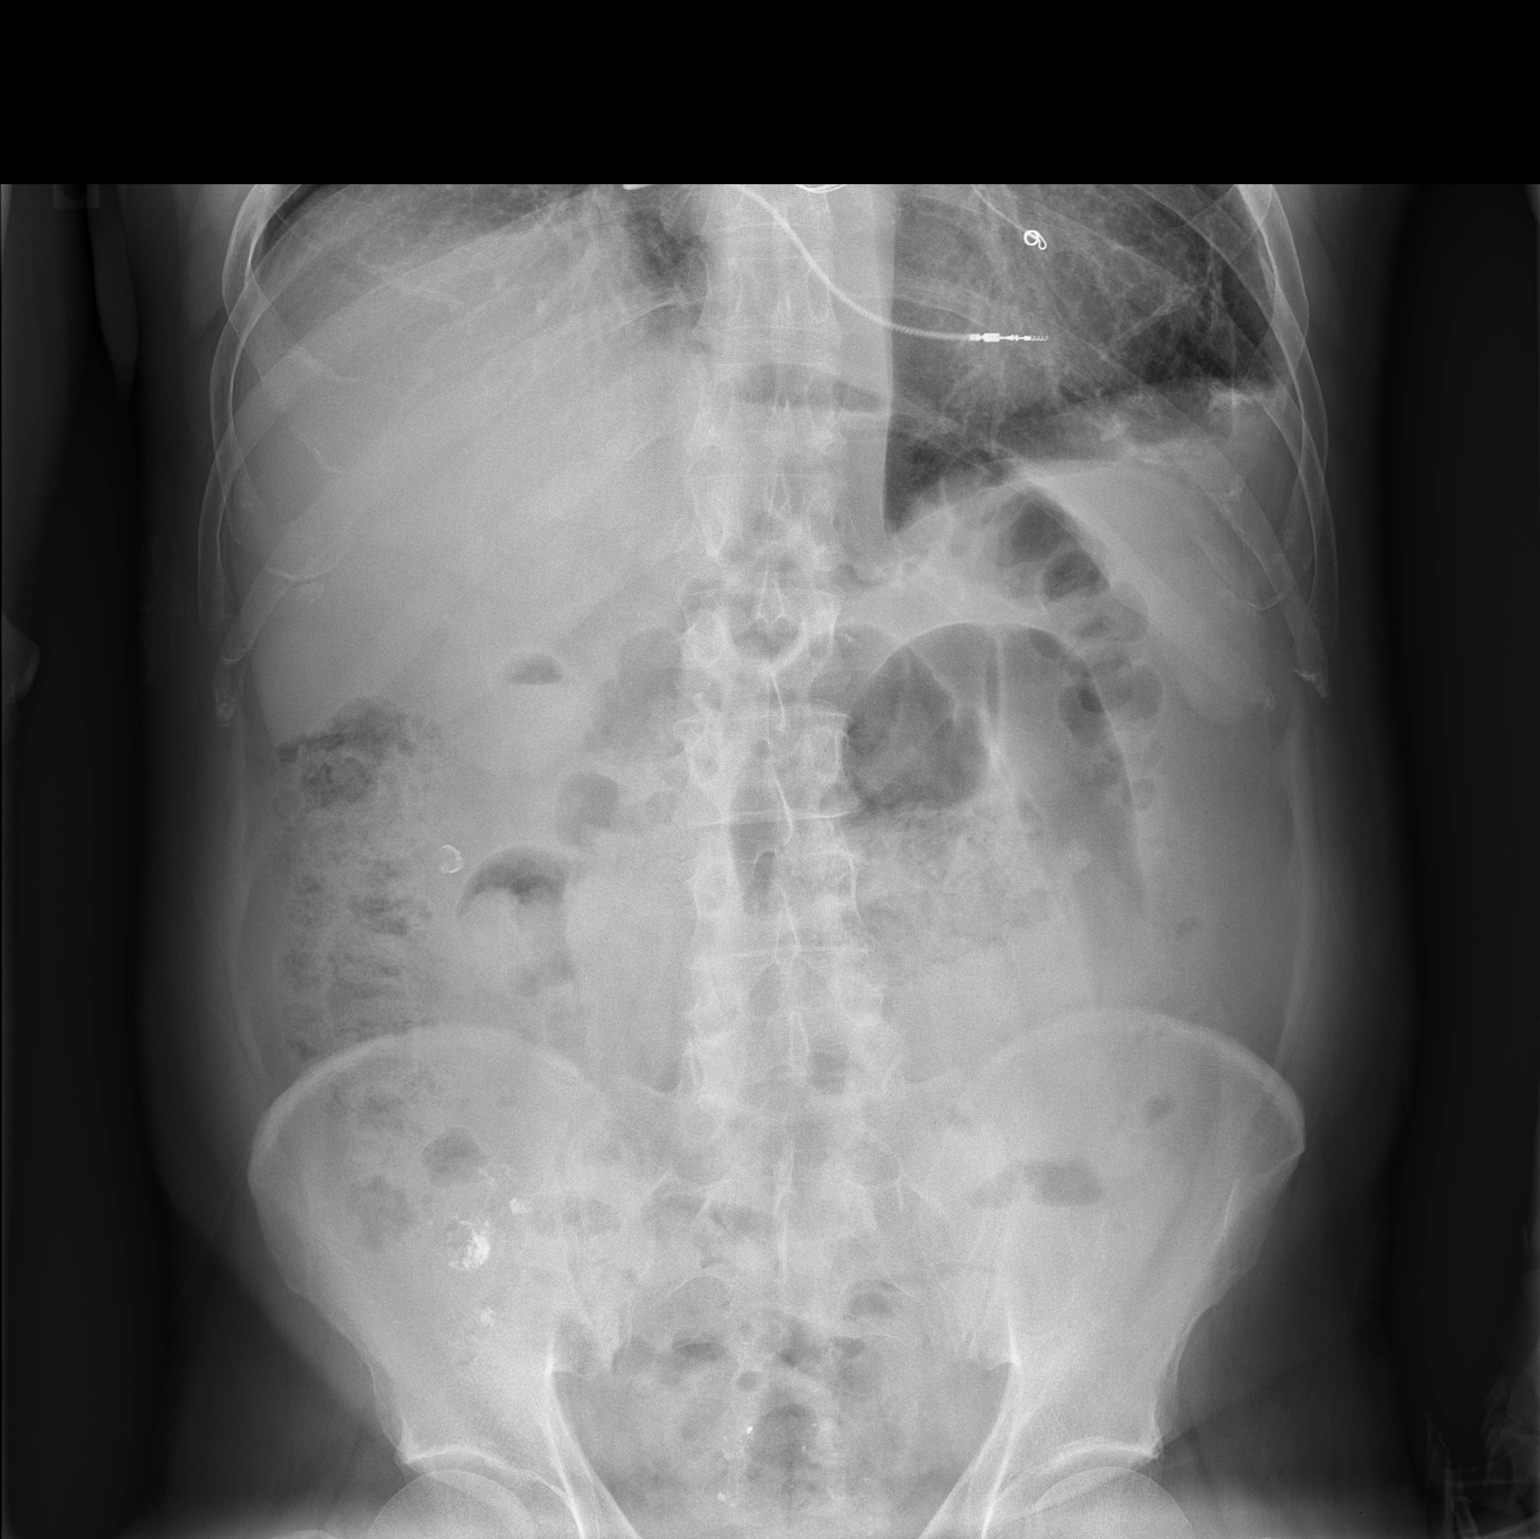

[t abdomen supine]
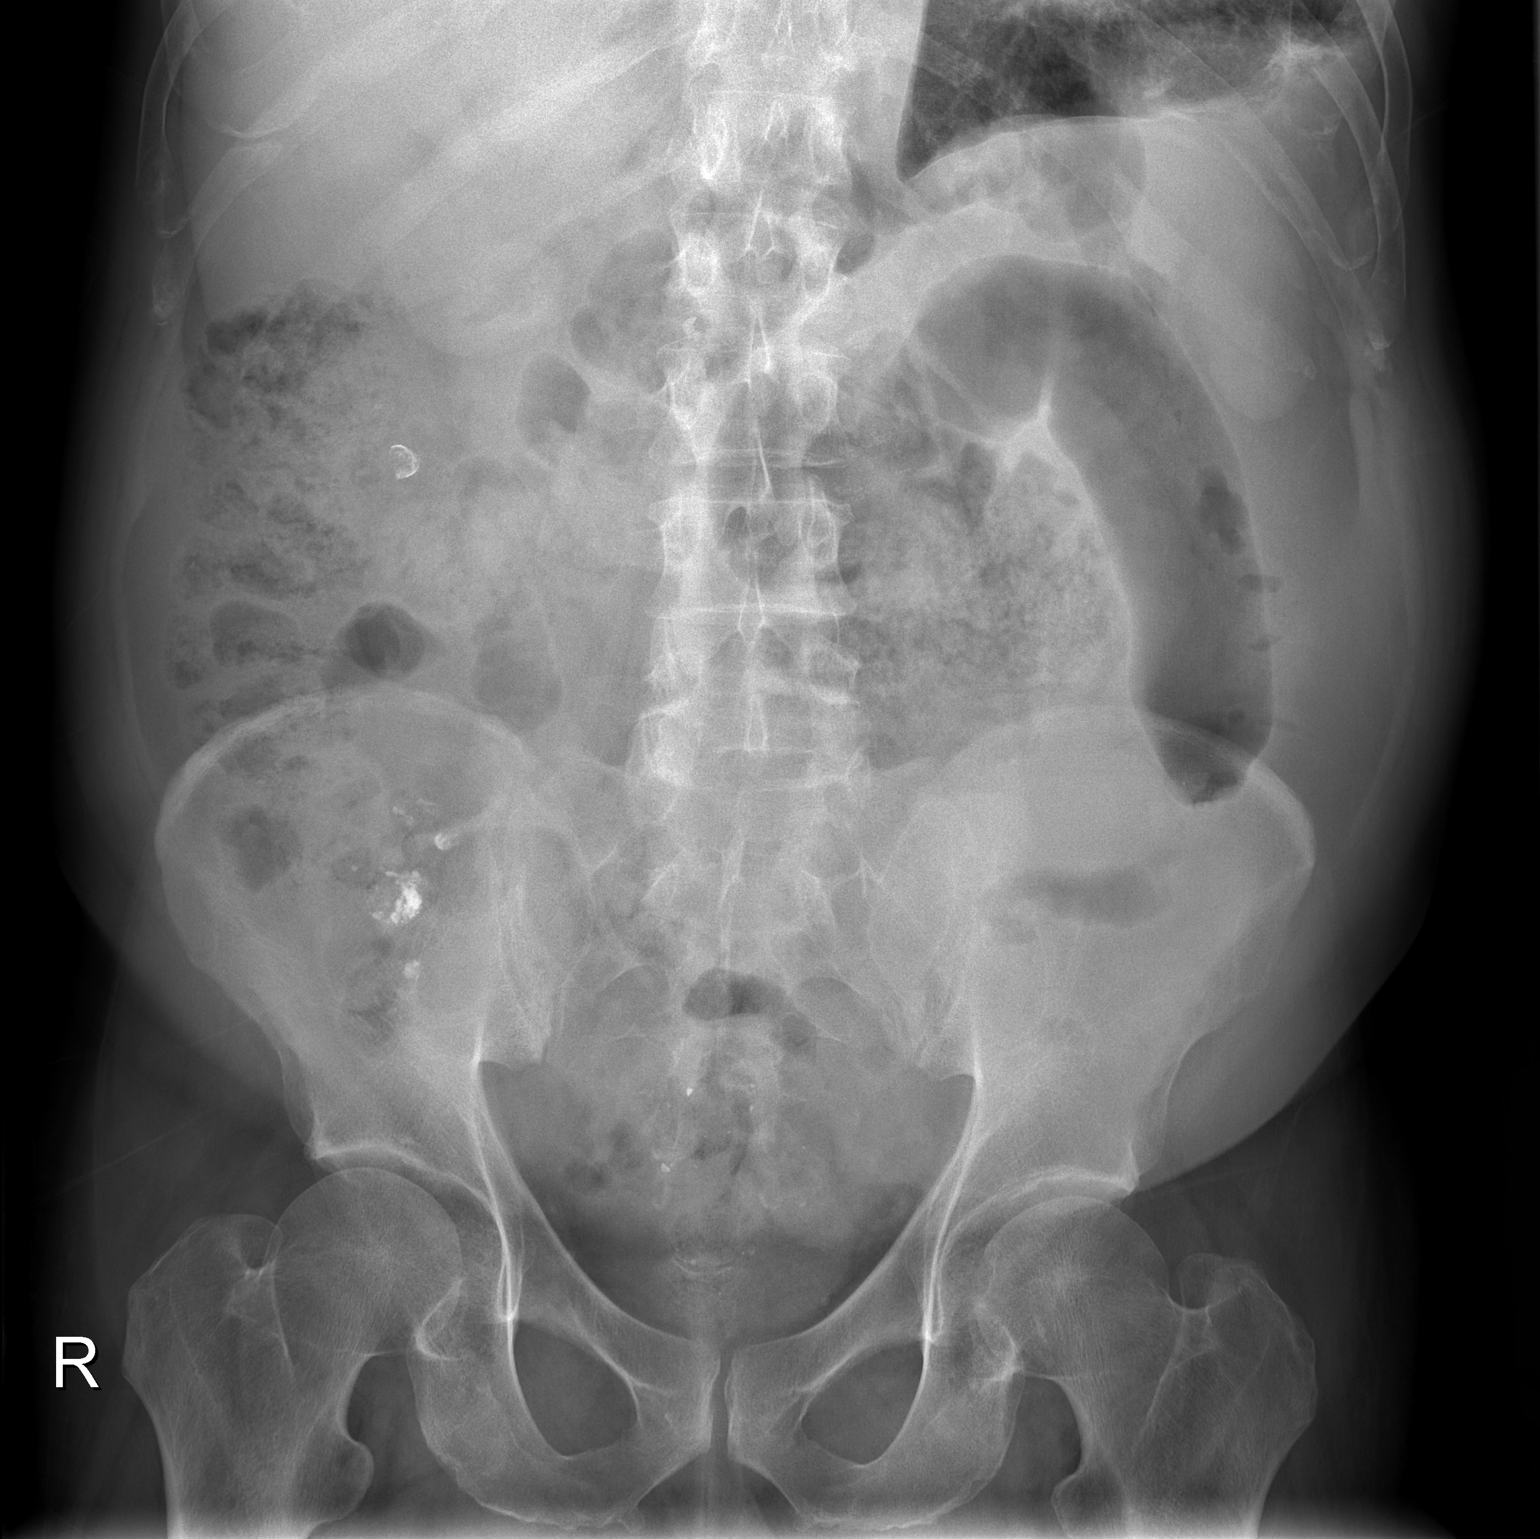

[2 of 2 positions shown; findings below may reference images not displayed]

Abdomen 2 view:

Comparison abdomen CT 01/03/2005. Previous median sternotomy and valve surgery.
Pacing leads are partially seen. Small bowel decompressed. Moderate fecal
material in the colon which is nondilated. A gas filled loop of bowel probably
sigmoid colon in the   left lower abdomen, without dilatation or other
abnormality. Visualized bones unremarkable. No free air.
IMPRESSION: 1. Nonobstructive bowel gas pattern; no free air

## 2008-02-07 IMAGING — CR DG CHEST 2V
1 series · 1 of 1 positions shown · non-contrast
Comparison: 10/25/2006

CLINICAL DATA: Shortness of breath.  
 CHEST - 2 VIEW:

[w chest lat]
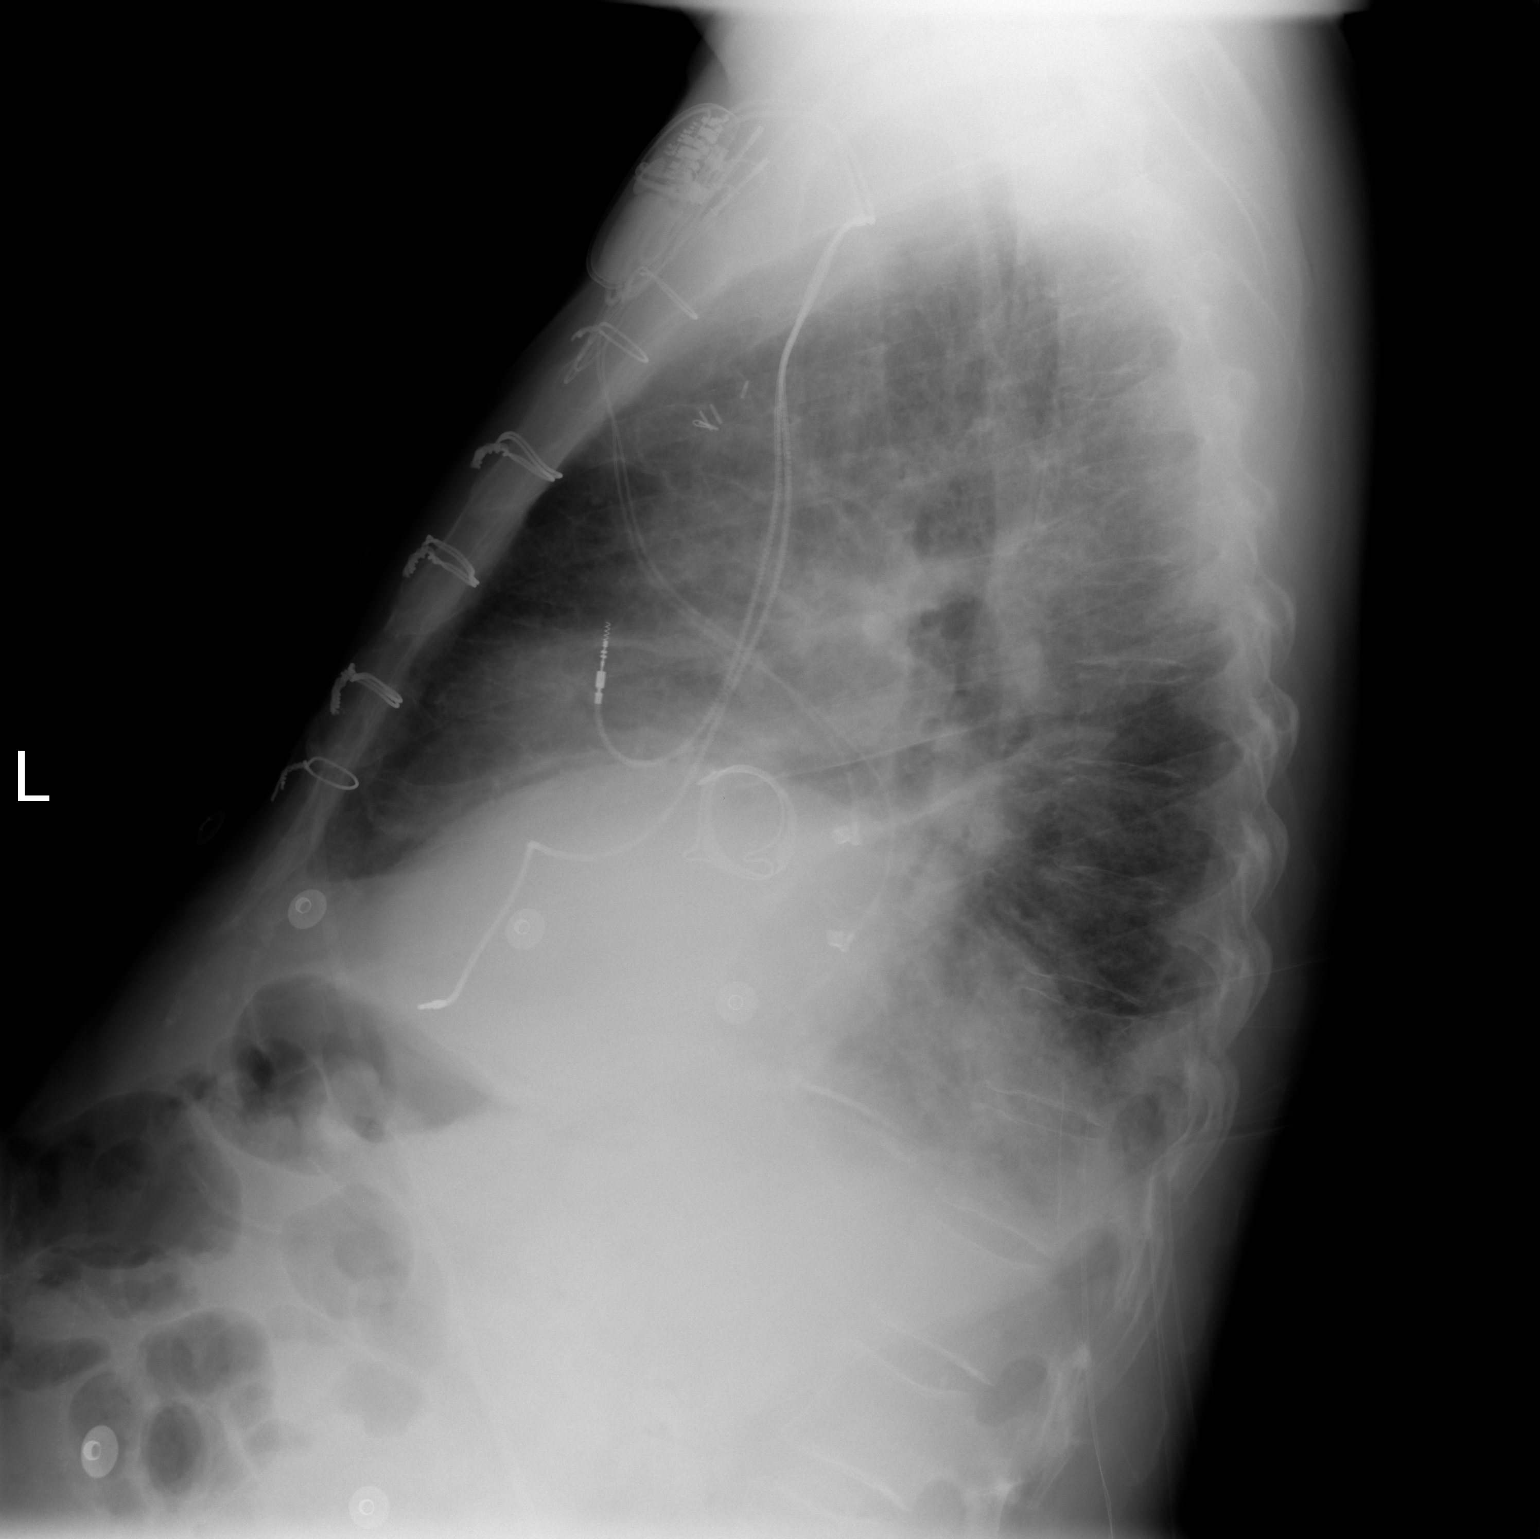

[1 of 1 positions shown; findings below may reference images not displayed]

FINDINGS: The heart remains enlarged.  Lungs are under inflated with right basilar atelectasis versus scarring.  A left midlung zone patchy density has developed.   No pneumothoraces or effusions are seen.  Aortic valve replacement is noted.  The dual lead pacemaker is stable. 
 Patchy opacities have developed in the right midlung zone as well.
IMPRESSION: 1.  Cardiomegaly. 
 2.  Patchy bilateral airspace disease either bronchopneumonia or CHF with pulmonary edema.

## 2008-02-07 IMAGING — CT CT ANGIO CHEST
2 of 6 series · 17 of 36 positions shown · IV contrast (APPLIED)
Comparison: 10/25/06.

CLINICAL DATA: Chest pain.  
 CT ANGIOGRAPHY OF CHEST:
TECHNIQUE: Multidetector CT imaging of the chest was performed during bolus injection of intravenous contrast.  Multiplanar CT angiographic image reconstructions were generated to evaluate the vascular anatomy.
 Contrast:  100 cc Omnipaque 300.

[Series 8: pulm embolism 1.0 thins · axial · 0.62mm/px · z∈[-136,+69]mm · 14 of 237 slices shown]
[im 16/237  lung]
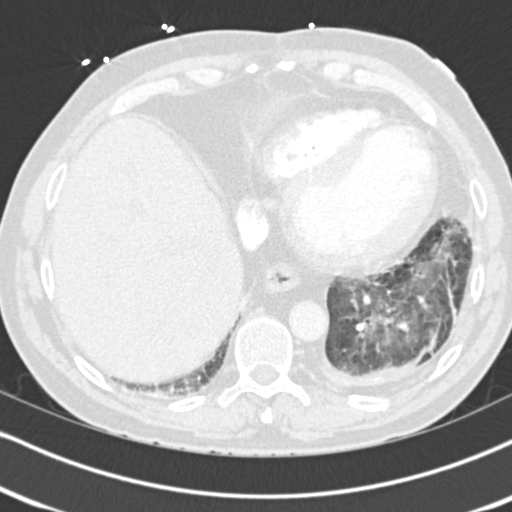
[im 32/237  mediastinal]
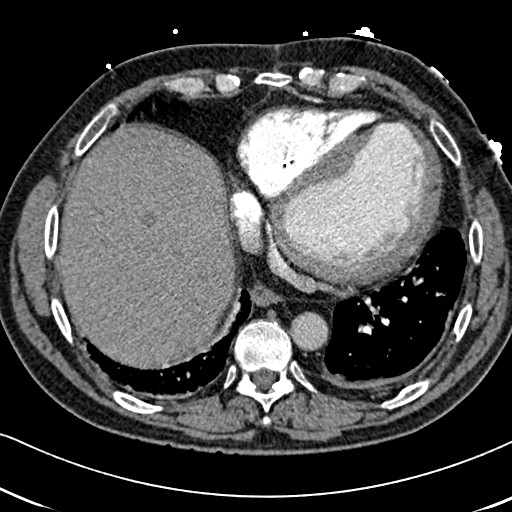
[im 48/237  lung]
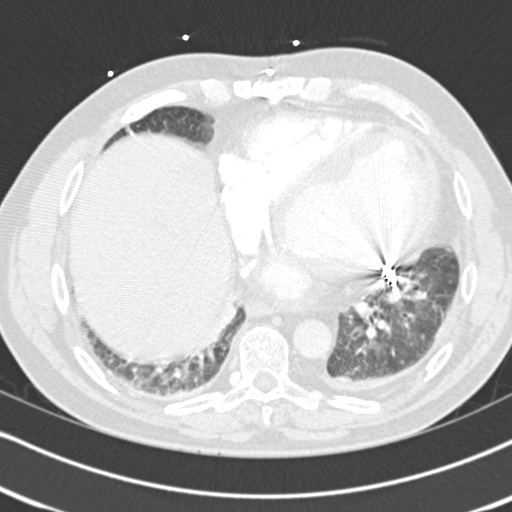
[im 63/237  mediastinal]
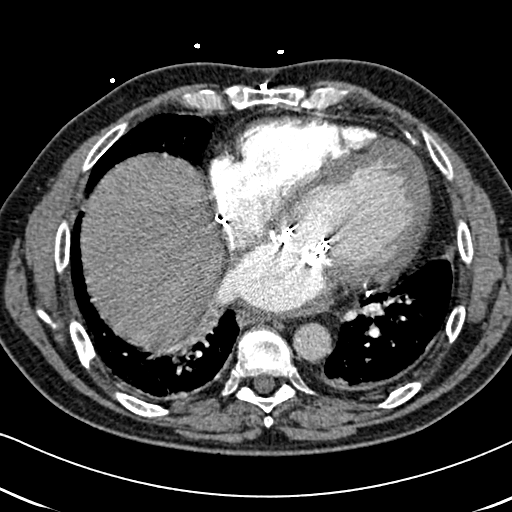
[im 79/237  lung]
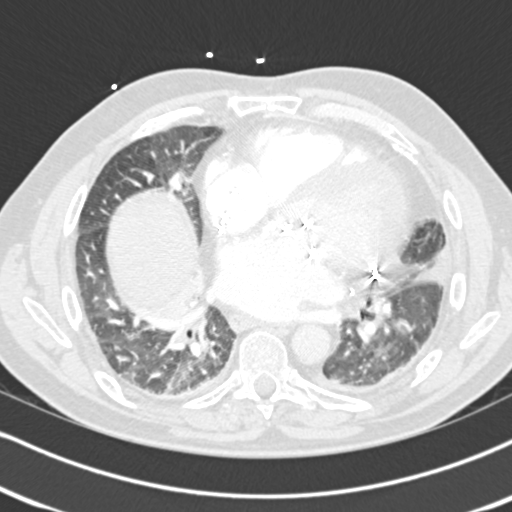
[im 95/237  mediastinal]
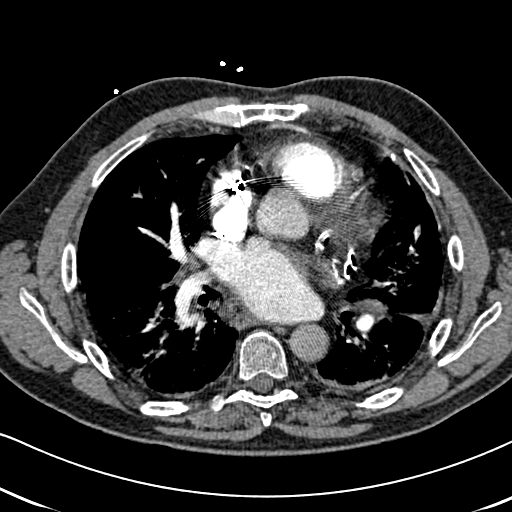
[im 111/237  lung]
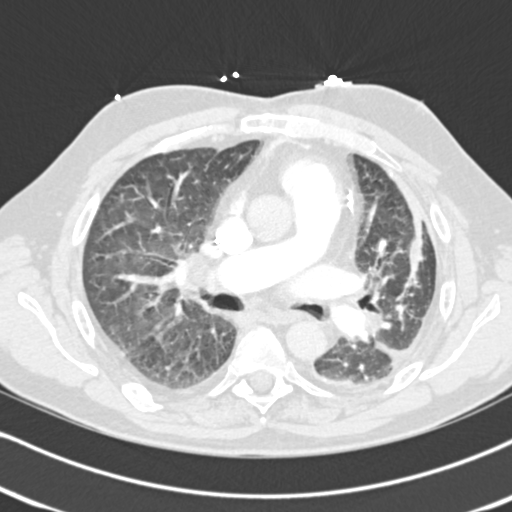
[im 126/237  mediastinal]
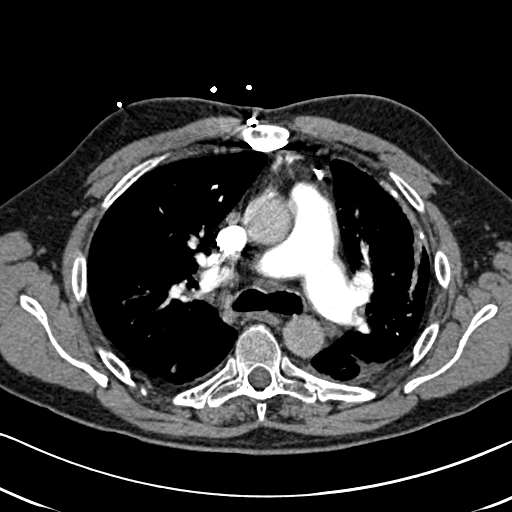
[im 142/237  lung]
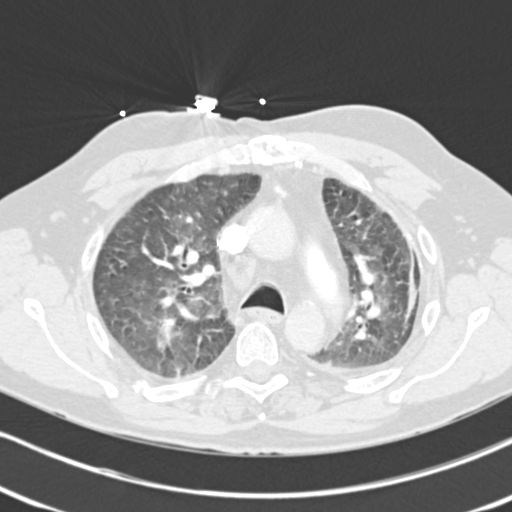
[im 158/237  mediastinal]
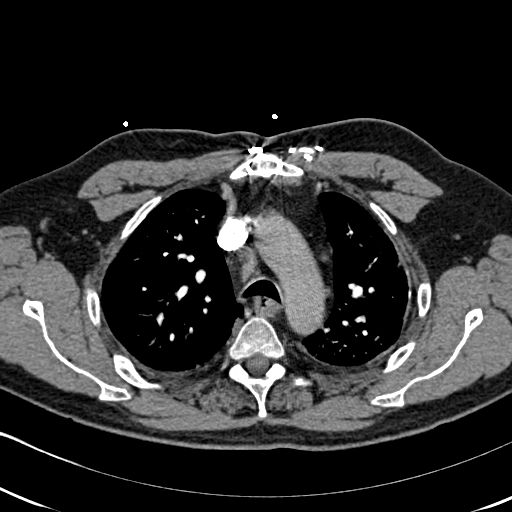
[im 174/237  lung]
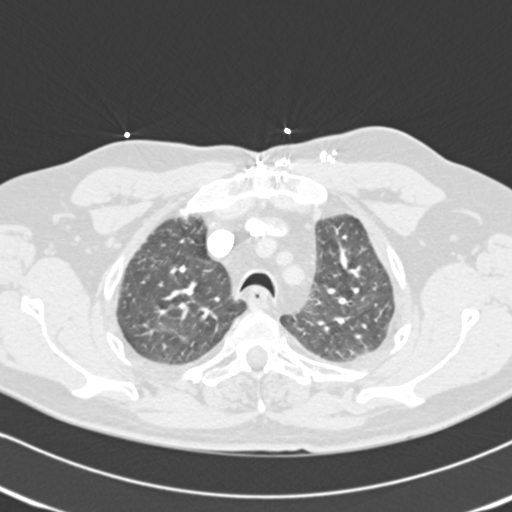
[im 189/237  mediastinal]
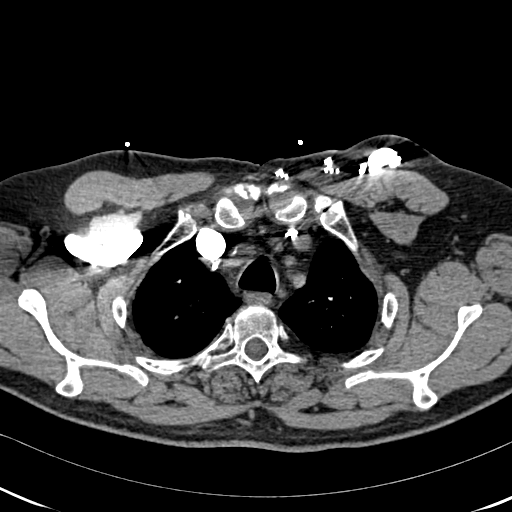
[im 205/237  lung]
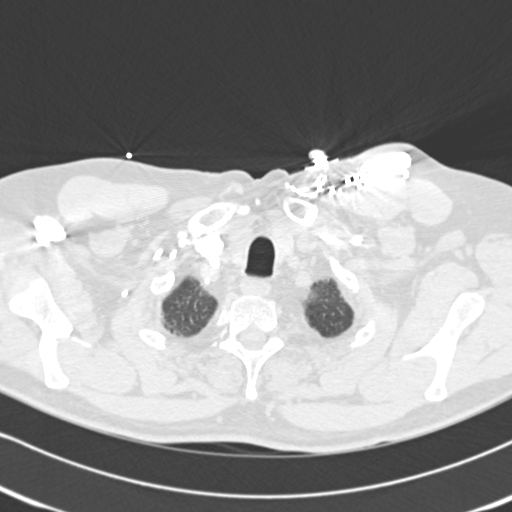
[im 221/237  mediastinal]
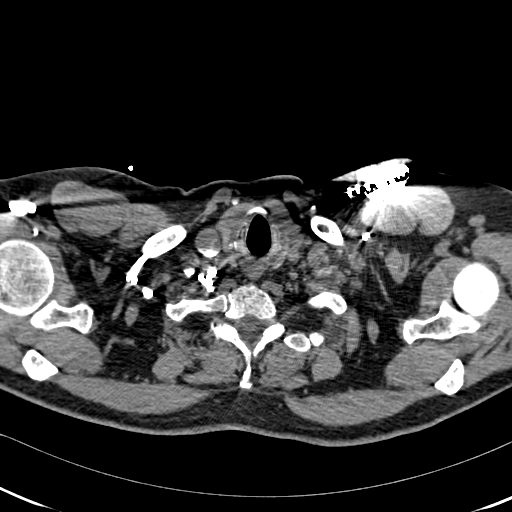

[Series 9: pulm embolism 2.0 cor · coronal · 0.63mm/px · 3 of 117 slices shown]
[im 24/117  mediastinal]
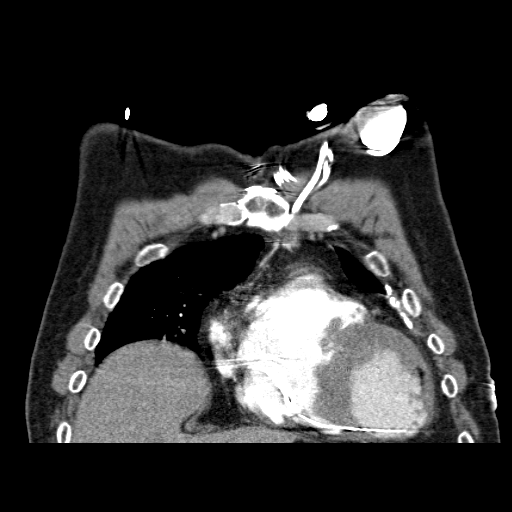
[im 47/117  mediastinal]
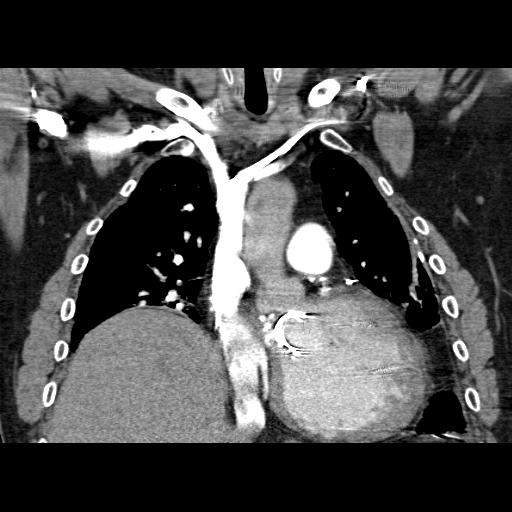
[im 70/117  mediastinal]
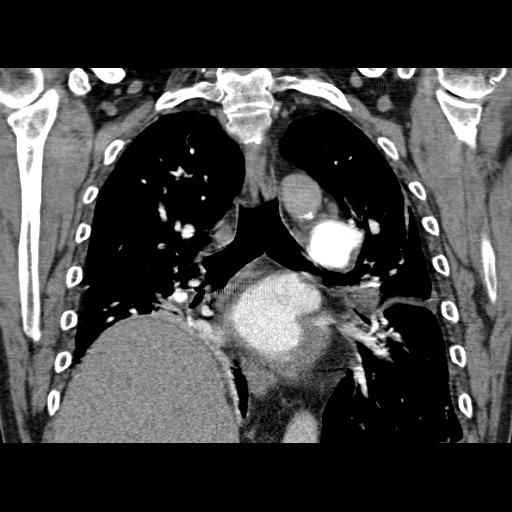

[17 of 36 positions shown; findings below may reference images not displayed]

FINDINGS: There are no filling defects in the pulmonary arterial tree to suggest acute pulmonary thromboembolism.  
 Mild mediastinal adenopathy is stable.  Bilateral perihilar peribronchovascular soft tissue thickening is noted.  Bilateral central and basilar airspace opacities are present.  Linear atelectasis vs scar is noted in the left upper lobe and left lower lobe.  No pneumothoraces or effusions are seen.  The heart is noted to be moderately enlarged.  A prosthetic aortic valve is in place.  A left subclavian pacemaker device is noted.  A percutaneous pacemaker device extends into the mediastinum.  Old healed rib fractures with deformity are present.
IMPRESSION: 1.  No acute pulmonary thromboembolism. 
 2.  Bilateral patchy airspace disease likely edema.  
 3.  Pacemaker device. 
 4.  Postoperative changes.

## 2008-02-09 IMAGING — CR DG CHEST 2V
2 series · 2 of 2 positions shown · non-contrast
Comparison: CT chest and chest x-ray 05/02/07.

CLINICAL DATA: Pneumonia. Shortness of breath and congestion.
 CHEST - 2 VIEW:

[w chest pa]
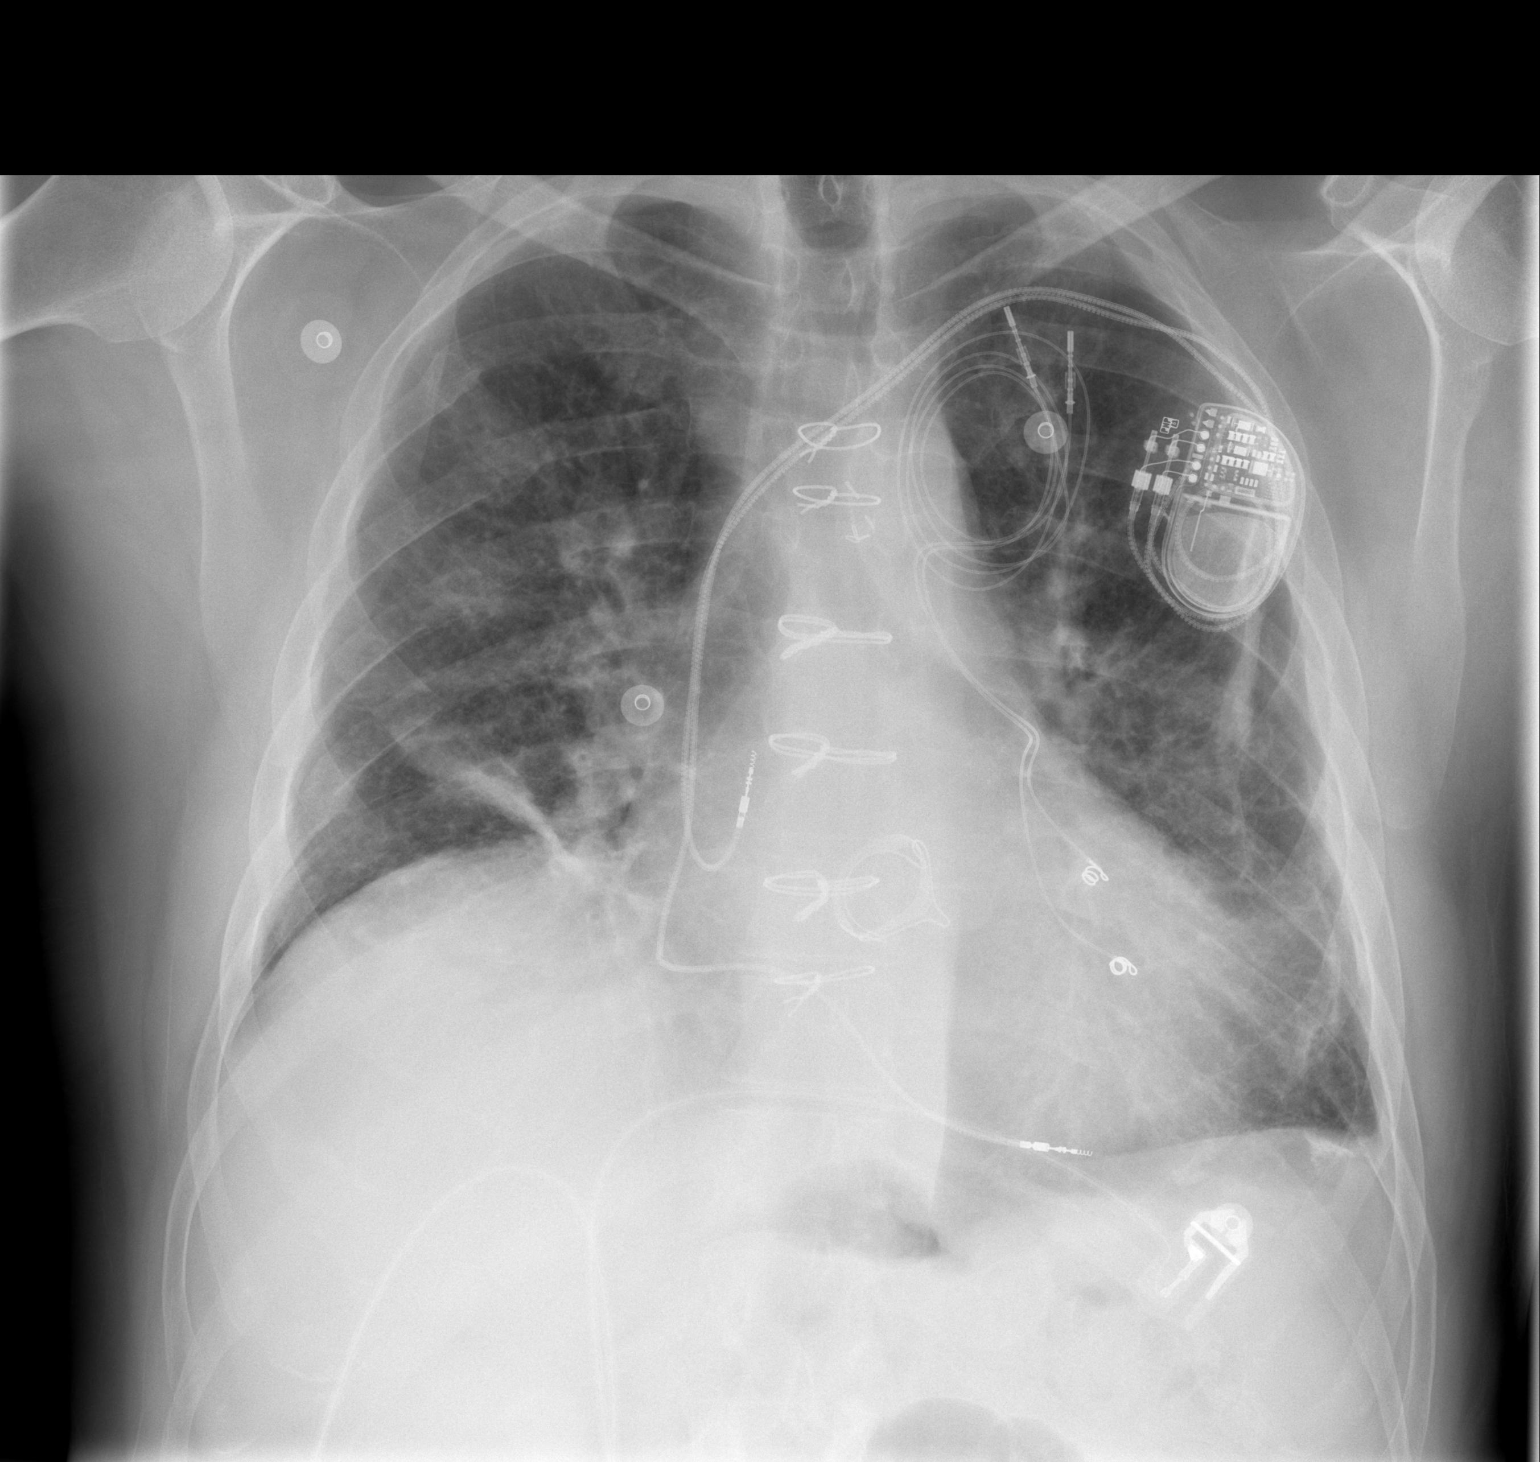

[w chest lat]
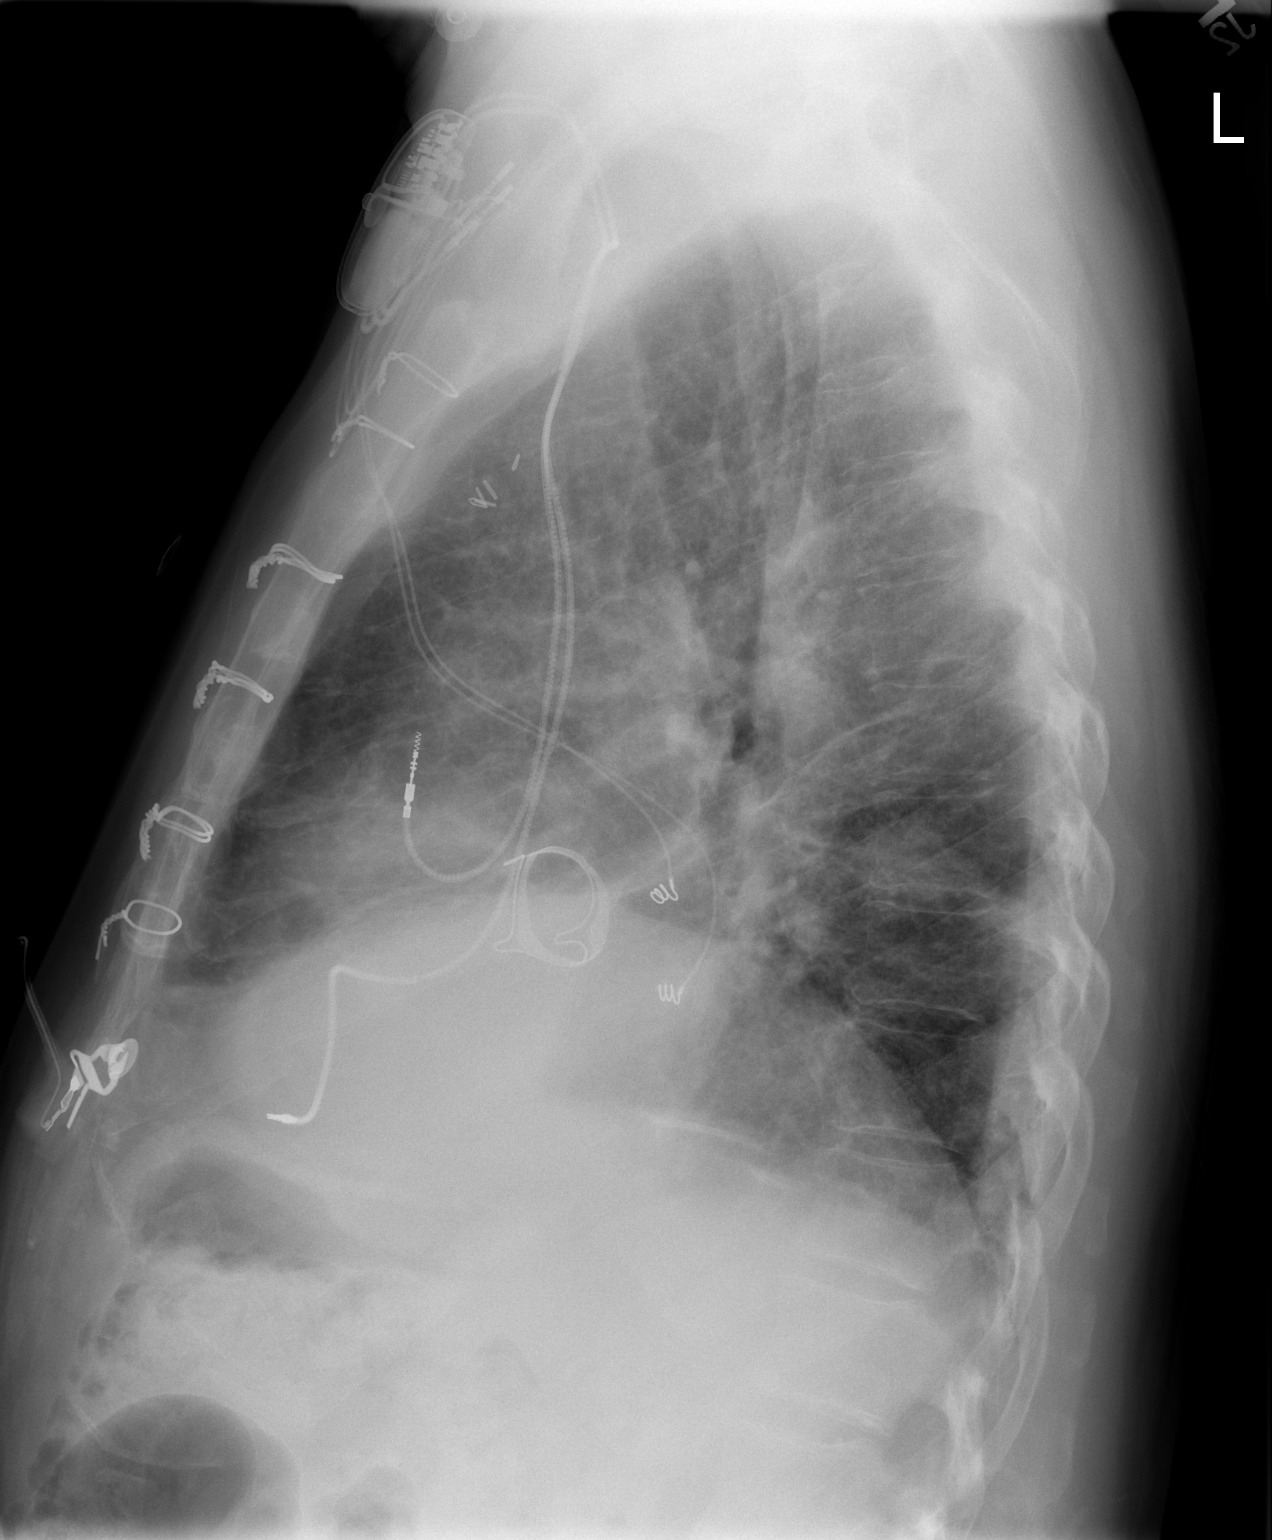

[2 of 2 positions shown; findings below may reference images not displayed]

FINDINGS: The trachea is midline. Heart size mildly enlarged.  Mixed interstitial and air space disease persists. Trace left pleural fluid. Right base atelectasis.
IMPRESSION: Congestive heart failure.

## 2008-02-24 ENCOUNTER — Inpatient Hospital Stay (HOSPITAL_COMMUNITY): Admission: EM | Admit: 2008-02-24 | Discharge: 2008-02-29 | Payer: Self-pay | Admitting: Emergency Medicine

## 2008-02-27 ENCOUNTER — Encounter (INDEPENDENT_AMBULATORY_CARE_PROVIDER_SITE_OTHER): Payer: Self-pay | Admitting: *Deleted

## 2008-03-17 ENCOUNTER — Ambulatory Visit: Payer: Self-pay | Admitting: Internal Medicine

## 2008-03-17 ENCOUNTER — Inpatient Hospital Stay (HOSPITAL_COMMUNITY): Admission: EM | Admit: 2008-03-17 | Discharge: 2008-03-21 | Payer: Self-pay | Admitting: Emergency Medicine

## 2008-04-21 IMAGING — CT CT PELVIS W/ CM
2 of 5 series · 15 of 46 positions shown, 17 images · IV contrast (READICAT/WATER & [ID] OMNI 300)
Comparison: 01/03/2005 and a chest CT from 10/25/2006

ABDOMEN CT WITH CONTRAST:

Addendum Begins
I personally called this result to Dr. Djakaridia, who was covering for Dr. Avelar,
at approximately [DATE] a.m. on 07/15/2007. Dr. Djakaridia informed us to send the
patient to emergency department. I discussed the findings of today's study with
both the patient and his daughter and advised them to proceed directly to [REDACTED]. They both voiced understanding and
agreed to proceed directly to the emergency room. I then telephoned Mulatre, in
triage, and alerted her to the impending arrival of Mr. Yoly and alerted her
to the presence of the thrombus within his left atrium.
Addendum Ends
CLINICAL DATA: Right lower quadrant pain. Nausea. Elevated liver enzymes.
TECHNIQUE: Multidetector CT imaging of the abdomen and pelvis was performed
following the standard protocol during bolus administration of intravenous
contrast.

Contrast:  100 cc Omnipaque 300

[Series 3: routine abdomen · axial · 0.70mm/px · z∈[-391,+14]mm · 12 of 93 slices shown, 14 images]
[im 6/93  soft-tissue]
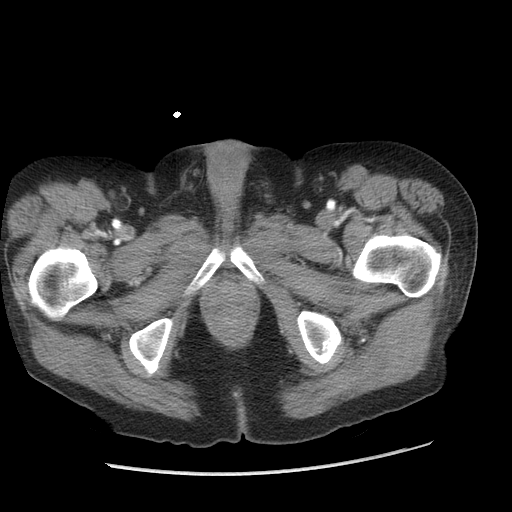
[im 6/93  bone]
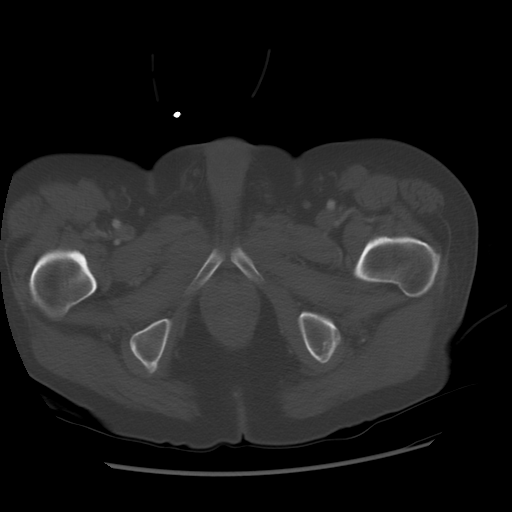
[im 16/93  soft-tissue]
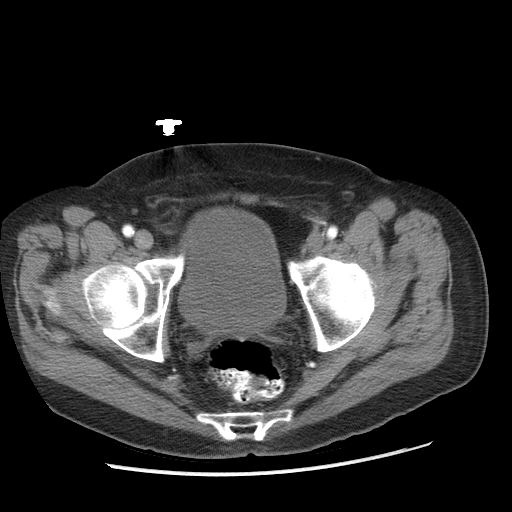
[im 21/93  soft-tissue]
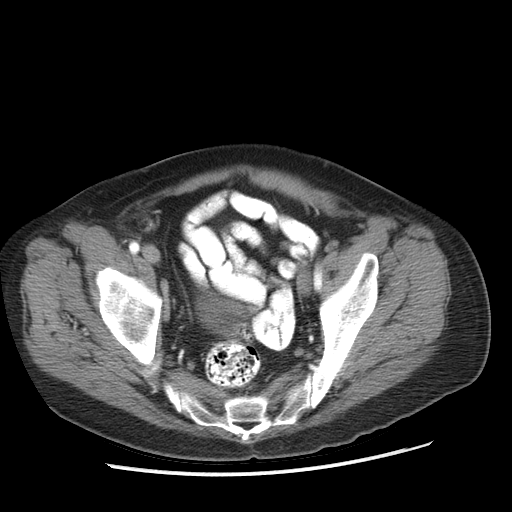
[im 26/93  soft-tissue]
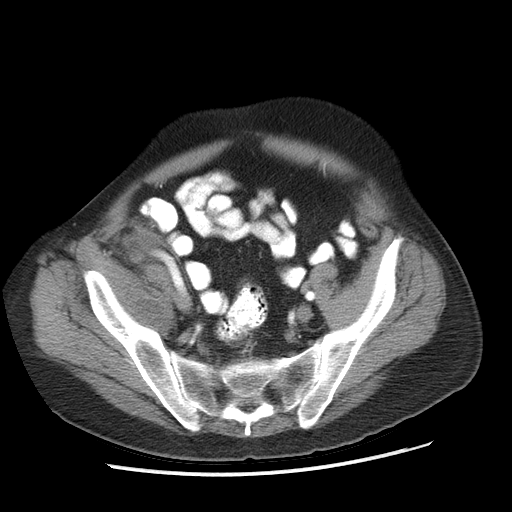
[im 36/93  soft-tissue]
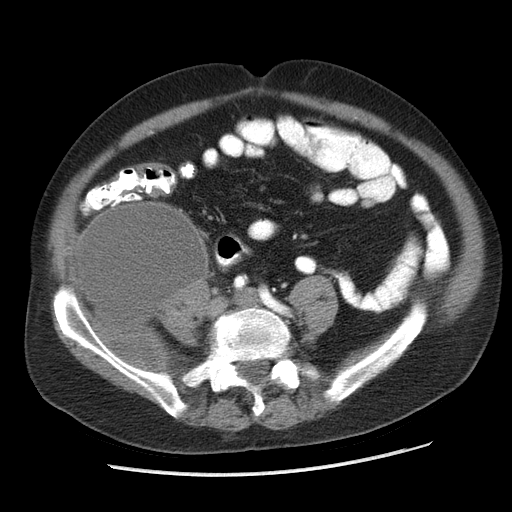
[im 41/93  soft-tissue]
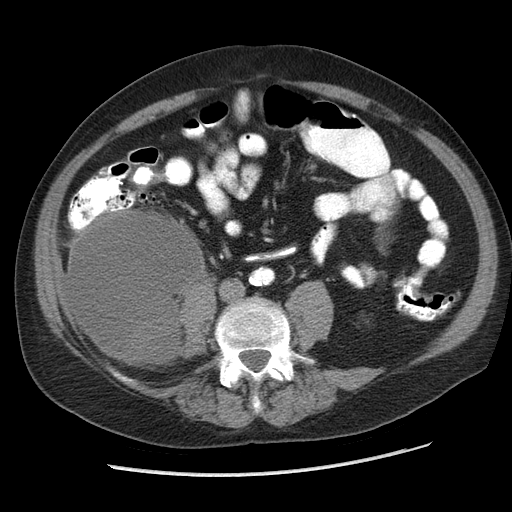
[im 52/93  soft-tissue]
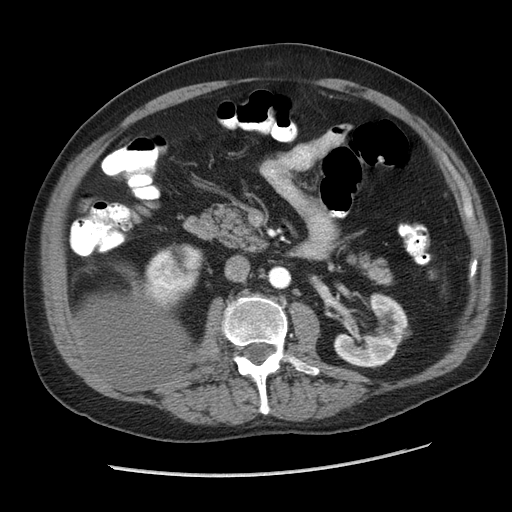
[im 57/93  soft-tissue]
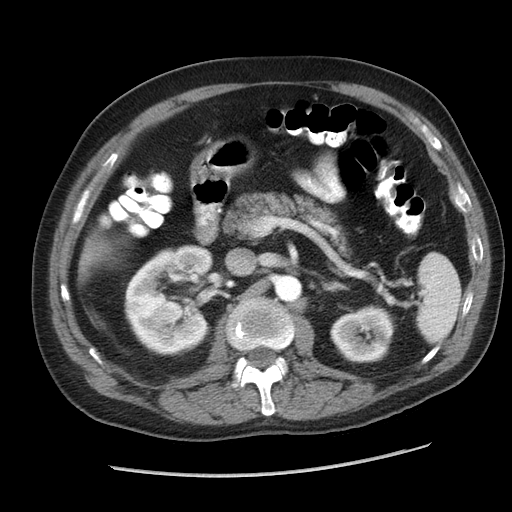
[im 67/93  soft-tissue]
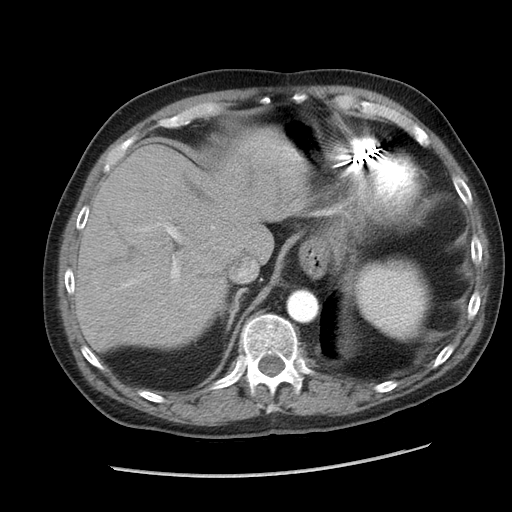
[im 67/93  bone]
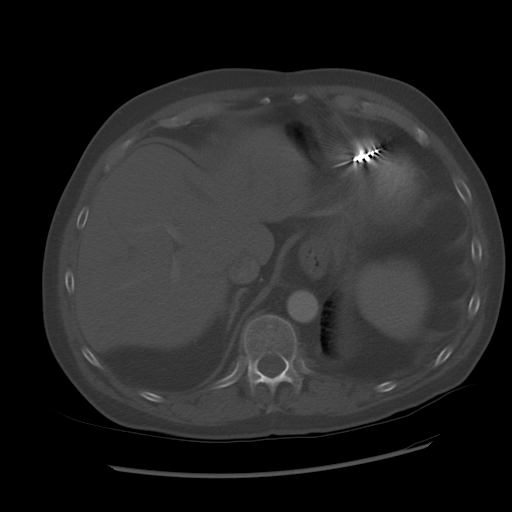
[im 72/93  soft-tissue]
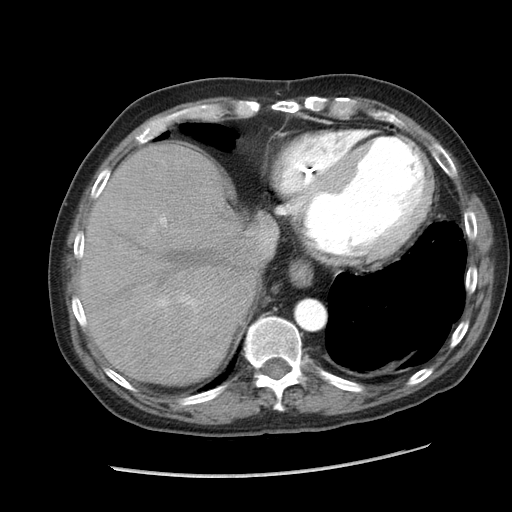
[im 77/93  soft-tissue]
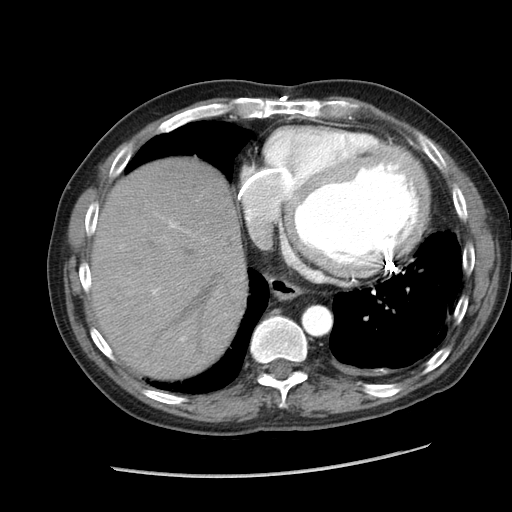
[im 87/93  soft-tissue]
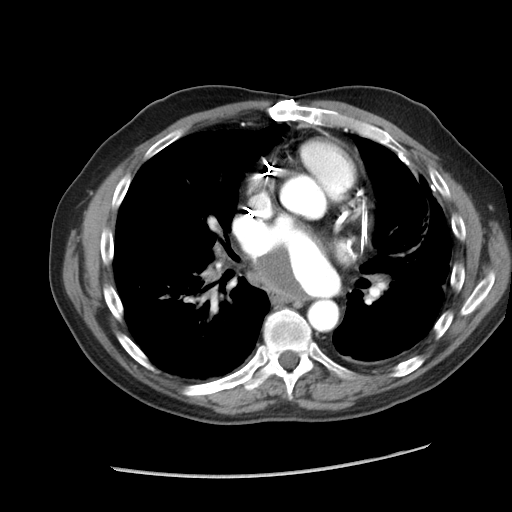

[Series 602: sagittal body · sagittal · 0.94mm/px · 3 of 145 slices shown]
[im 49/145  soft-tissue]
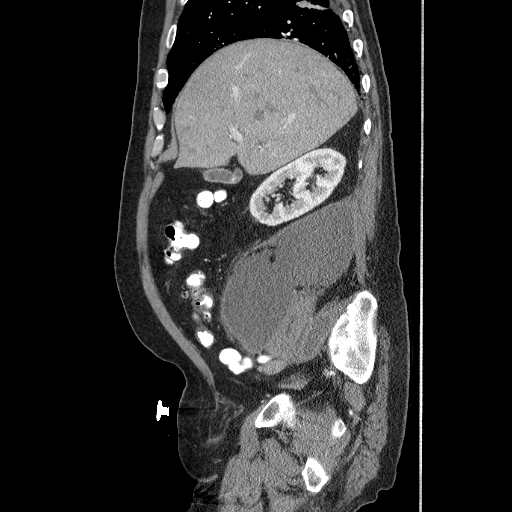
[im 65/145  soft-tissue]
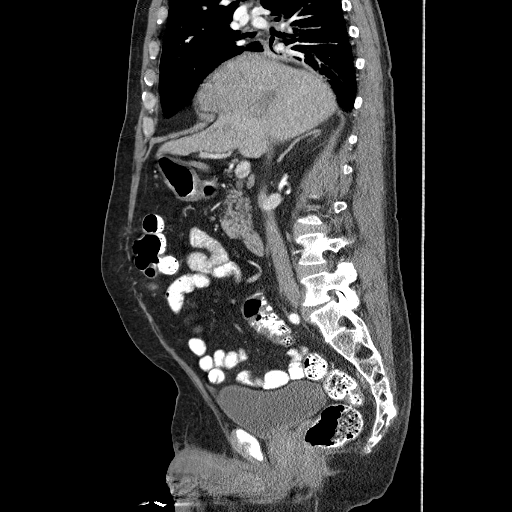
[im 81/145  soft-tissue]
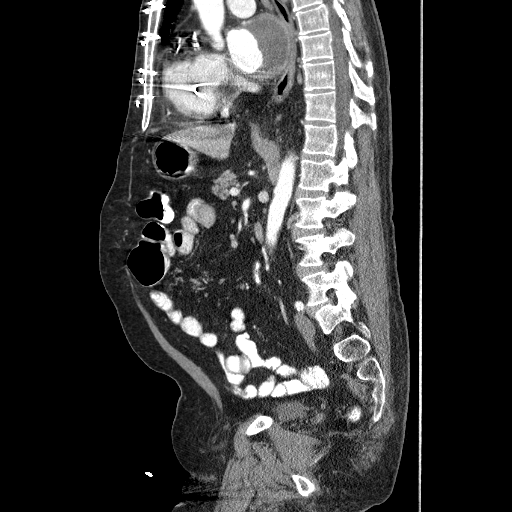

[15 of 46 positions shown; findings below may reference images not displayed]

FINDINGS: Images including the lung bases show linear scarring in the left lung
base, stable since the prior chest CT. The abnormal soft tissue attenuation in
the right infrahilar region is also stable. The patient has a large amount of
thrombus in the posterior aspect of the left atrium. Patient is status post
CABG. Pacer leads are seen in the right atrial appendage and right ventricle.

No focal abnormality is seen in the liver or spleen although portal venous
perfusion in the liver parenchyma is heterogeneous. The stomach, duodenum,
pancreas, and adrenal glands are unremarkable. Calcified stone is seen in the
neck of the gallbladder. There is scarring in the left kidney without
hydronephrosis or renal mass. No focal abnormality is seen in the right kidney.

No abdominal aortic aneurysm. No intraperitoneal free fluid. No abdominal
lymphadenopathy.

The patient has a large football shaped cystic lesion in the right
retroperitoneal space, measuring 18 x 8 x 10 cm. This is contiguous with and
deforms the right psoas muscle. More inferiorly, a second cystic lesion is
identified in the right iliacus muscle, measuring 7.0 x 2.5 x 4.0 cm. These 2
lesions are in close proximity and may communicate although a definite
communication cannot be visualized on this scan.

The patient has a complex ventral hernia extending almost 6 cm in craniocaudal
length. There are multiple discrete defects within the midline fascia along the
length of the hernia through which omental fat protrudes. There is no edema or
fluid in the herniated fat to suggest incarceration.
IMPRESSION: Large volume of thrombus within the left atrium.

Large retroperitoneal lesions on the right, as described. These have cystic
features and may be related to nonacute retroperitoneal hemorrhage although
abscess or cystic/necrotic tumor could also have this appearance. . These are
new since the abdominal CT from 2335.

Complex ventral hernia contains omental fat.

PELVIS CT WITH CONTRAST:
FINDINGS: The right iliacus lesion has been described above. There is no pelvic
side wall lymphadenopathy. No intraperitoneal free fluid. The bladder is mildly
distended. Prostate gland is enlarged. The appendix is mildly thickened at 7 mm
diameter although it is diffusely filled with air and contrast and shows no
inflammatory change. The terminal ileum is normal.
IMPRESSION: Cystic right pelvic sidewall mass. Please see abdomen report above.

## 2008-04-21 IMAGING — CR DG CHEST 2V
2 series · 2 of 2 positions shown · non-contrast
Comparison: 05/04/07 and earlier.

CLINICAL DATA: 62-year-old male with atrial thrombosis. 
 CHEST - 2 VIEW:

[w chest pa]
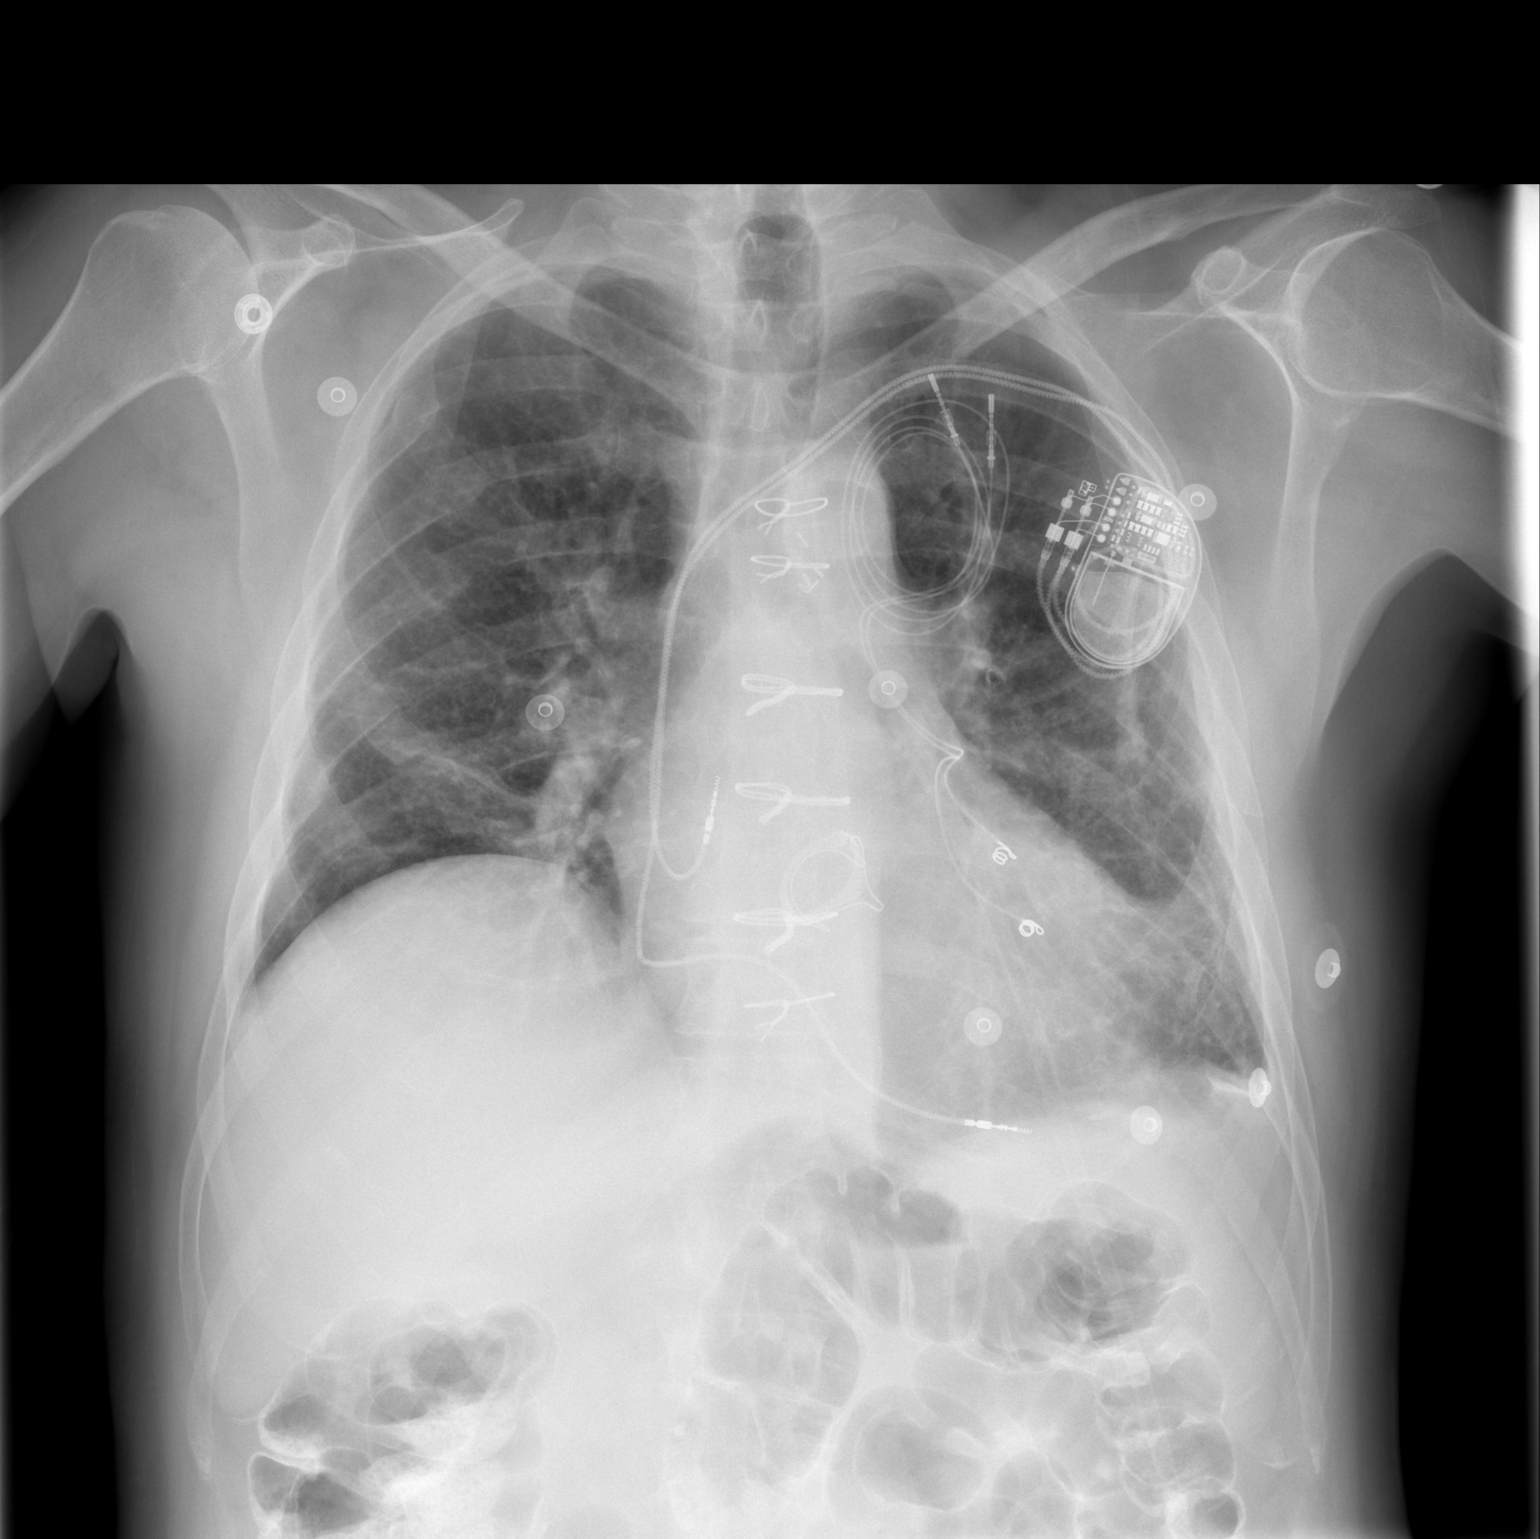

[w chest lat]
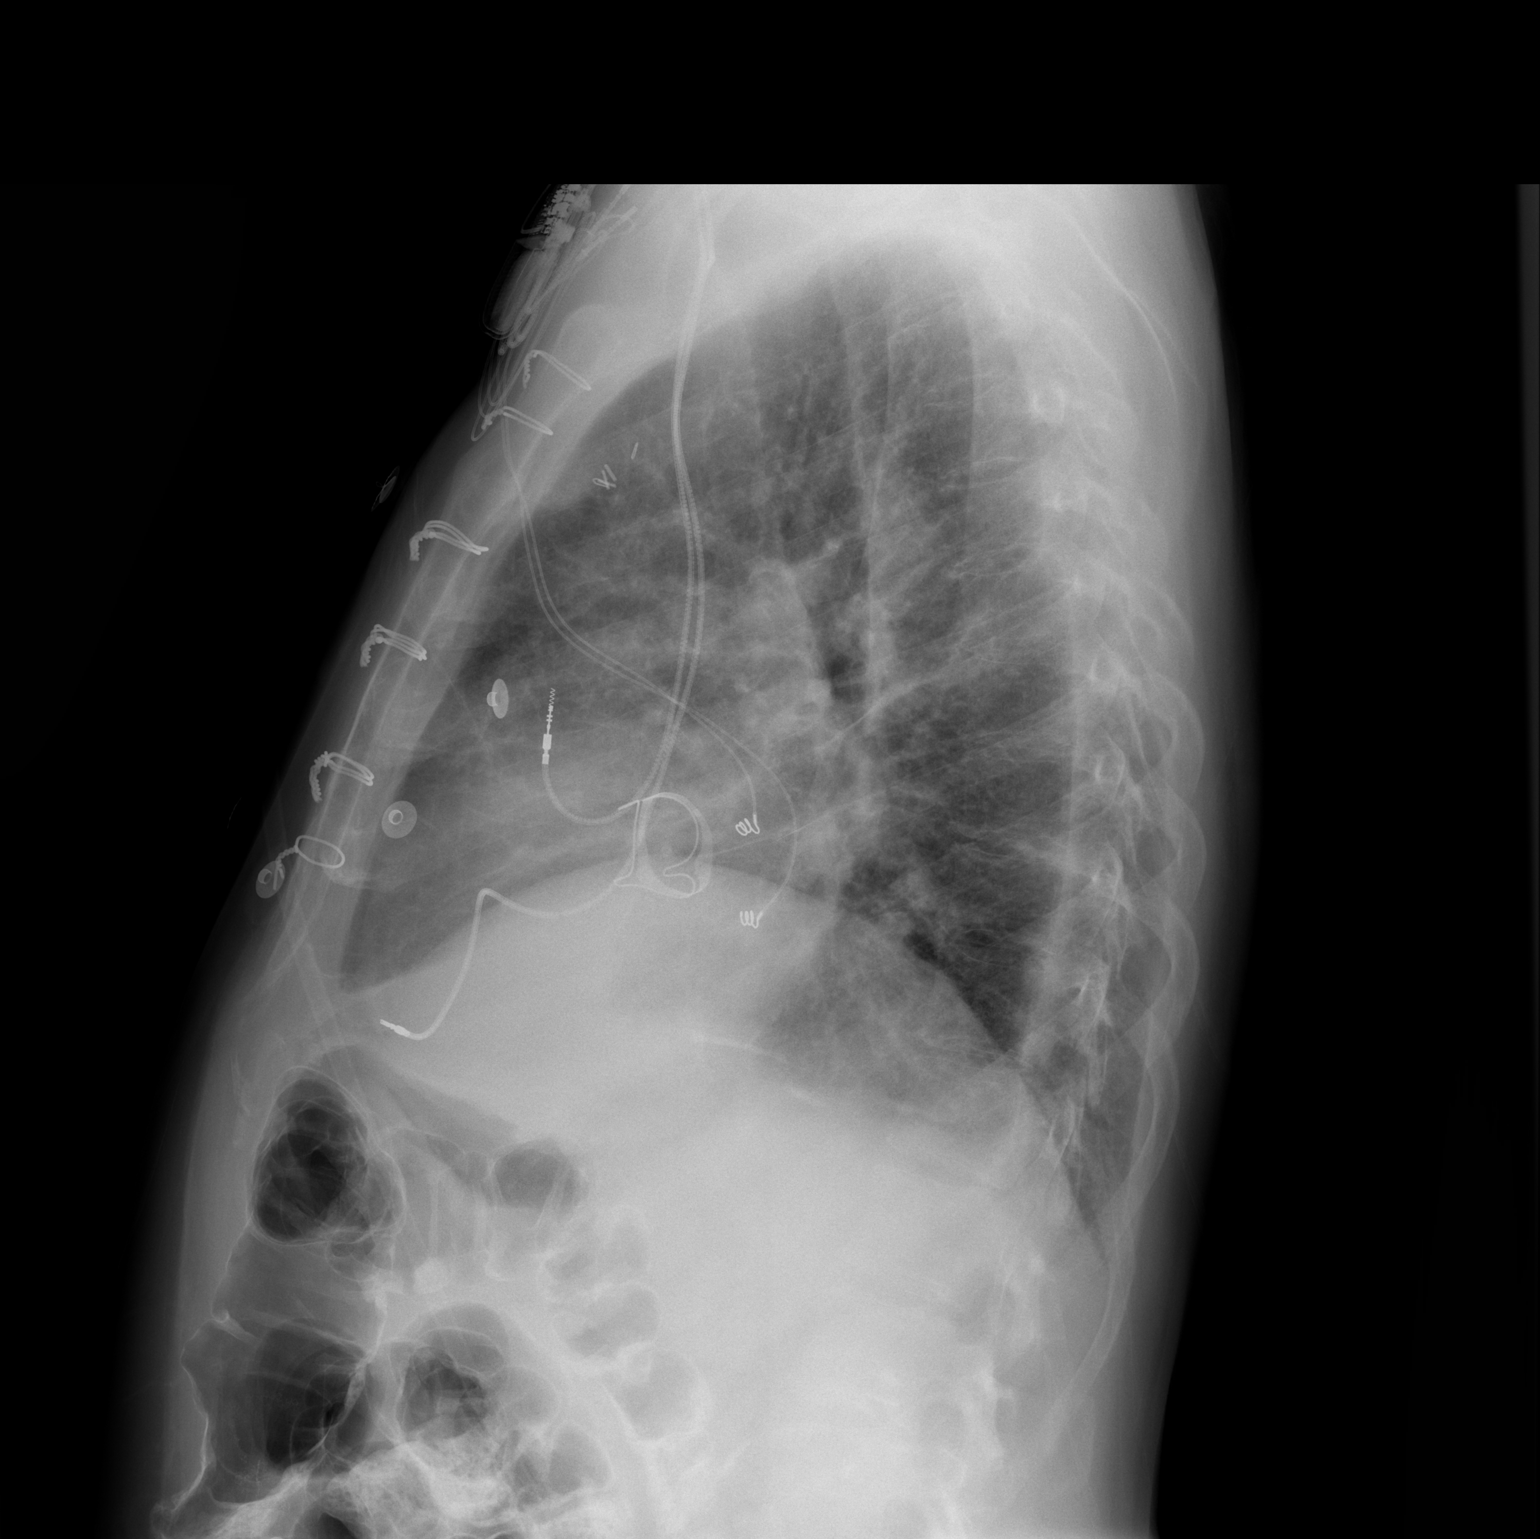

[2 of 2 positions shown; findings below may reference images not displayed]

FINDINGS: Stable left chest dual-lead transvenous pacemaker device. Epicardial pacemaker leads remain in place and are looped in the subcutaneous tissues adjacent to the generator device. Prosthetic cardiac valve, likely mitral is stable. Stable elevation of the right hemidiaphragm. Stable blunting of the left costophrenic angle.  No pleural effusion identified. No acute pulmonary edema with stable mild vascular congestion and chronic atelectasis or scarring. No pneumothorax. Chronic right rib fractures.  No acute osseous abnormality.
IMPRESSION: Stable postoperative appearance of the chest.  No acute cardiopulmonary abnormality.

## 2008-04-22 IMAGING — US US ABDOMEN COMPLETE
1 series · 14 of 25 positions shown · non-contrast
Comparison: CT 07/15/2007

CLINICAL DATA: Evaluate liver and gallbladder

ABDOMEN ULTRASOUND
TECHNIQUE: Complete abdominal ultrasound examination was performed including
evaluation of the liver, gallbladder, bile ducts, pancreas, kidneys, spleen,
IVC, and abdominal aorta.

[Series 1: unknown · 0.27mm/px · 14 of 56 slices shown]
[im 1/56]
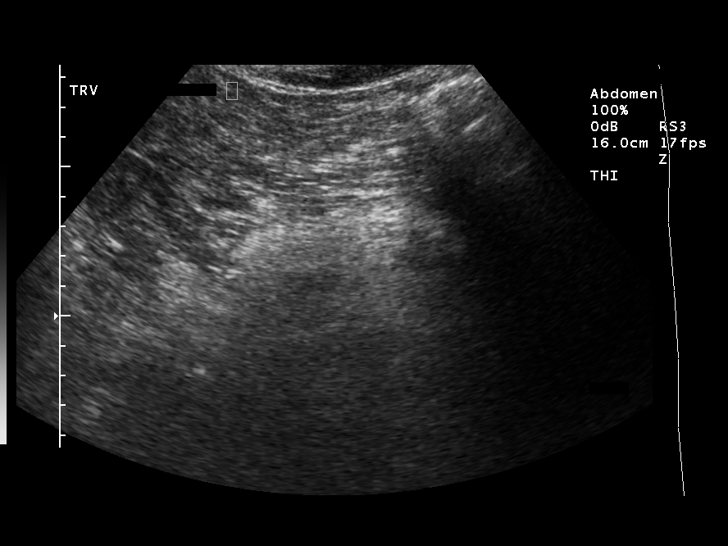
[im 5/56]
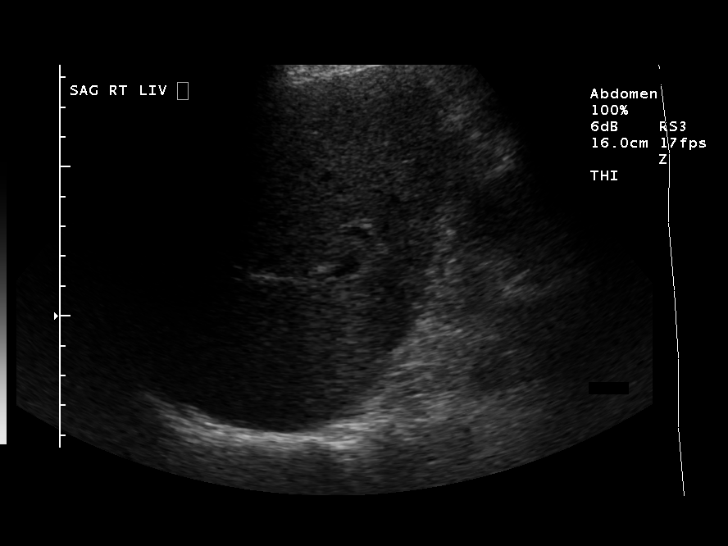
[im 10/56]
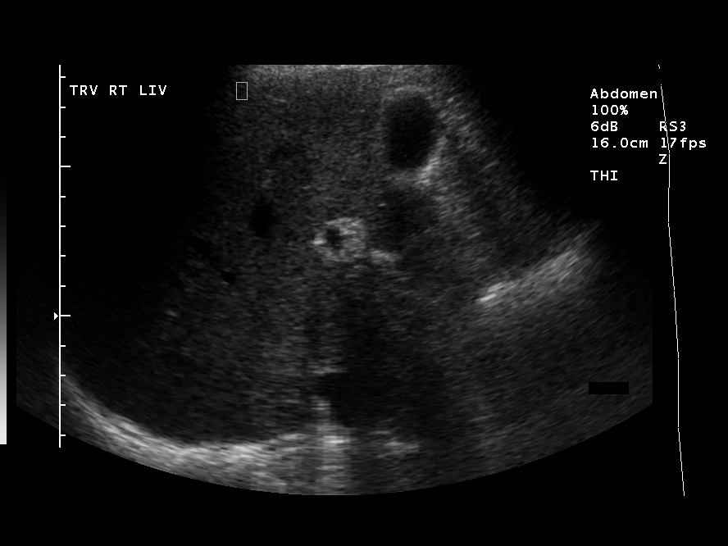
[im 14/56]
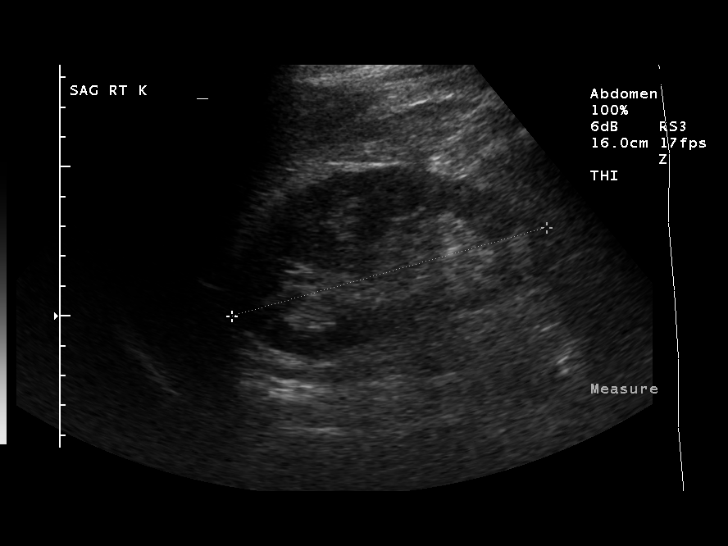
[im 19/56]
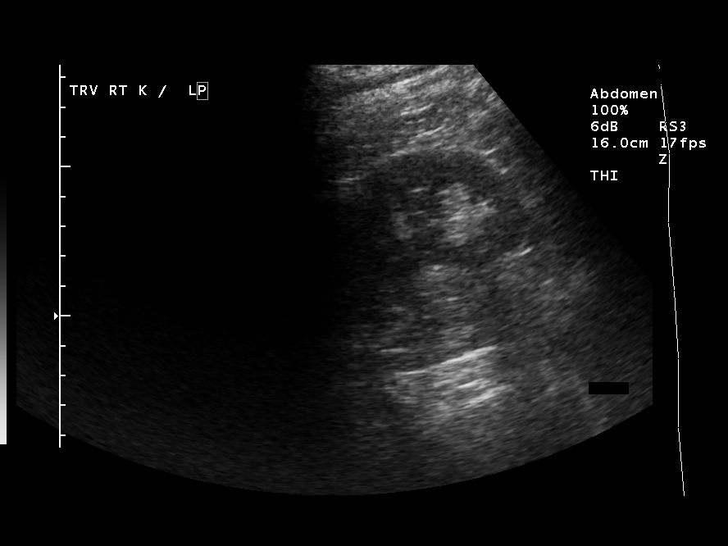
[im 21/56]
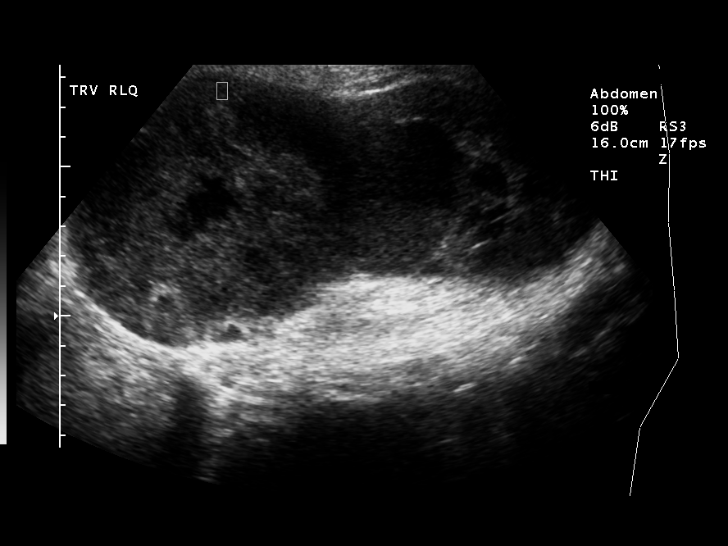
[im 26/56]
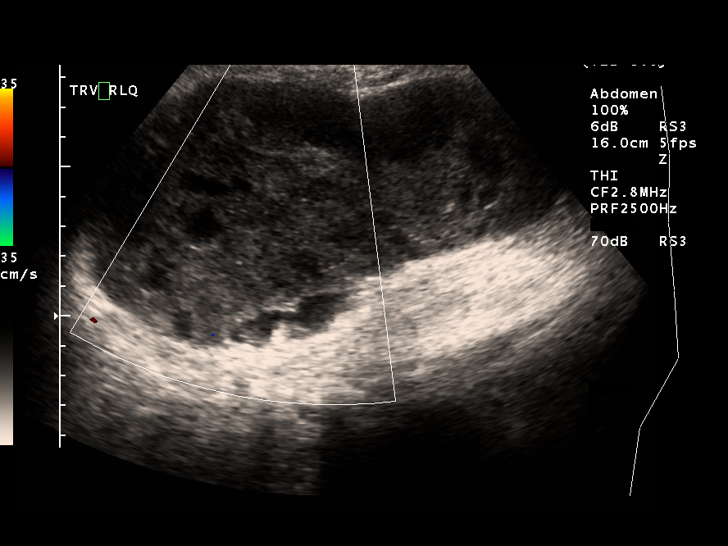
[im 30/56]
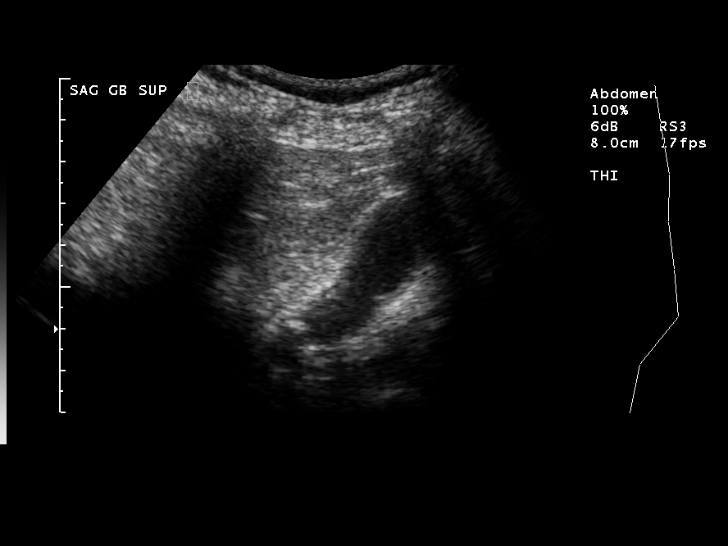
[im 35/56]
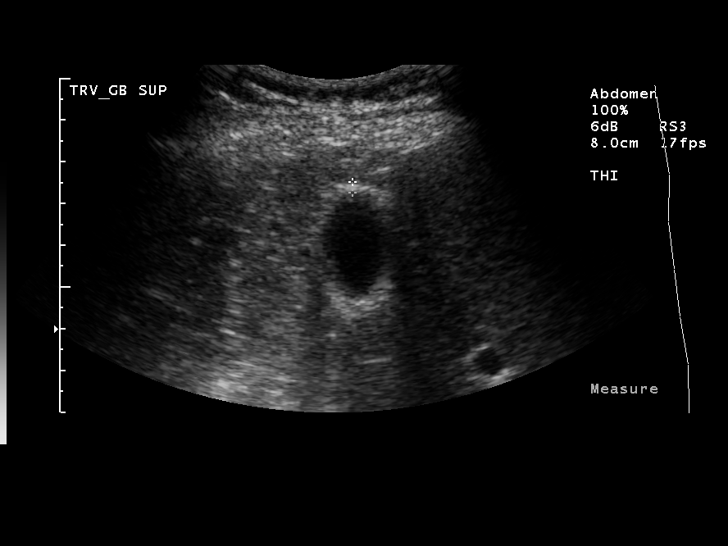
[im 37/56]
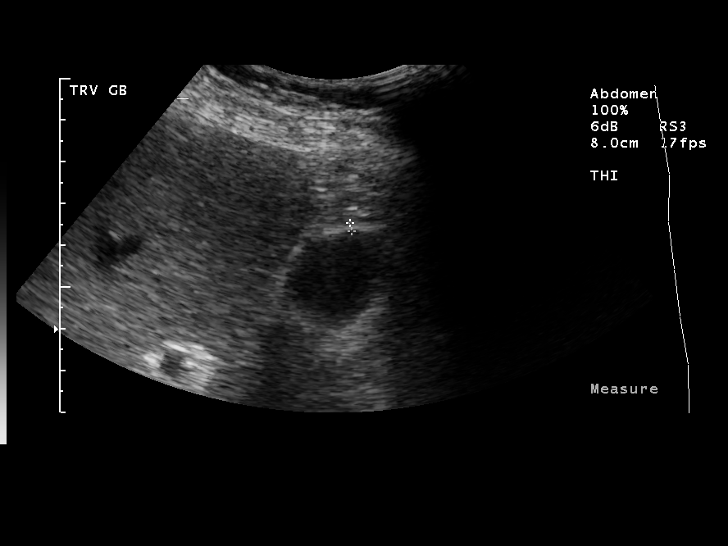
[im 42/56]
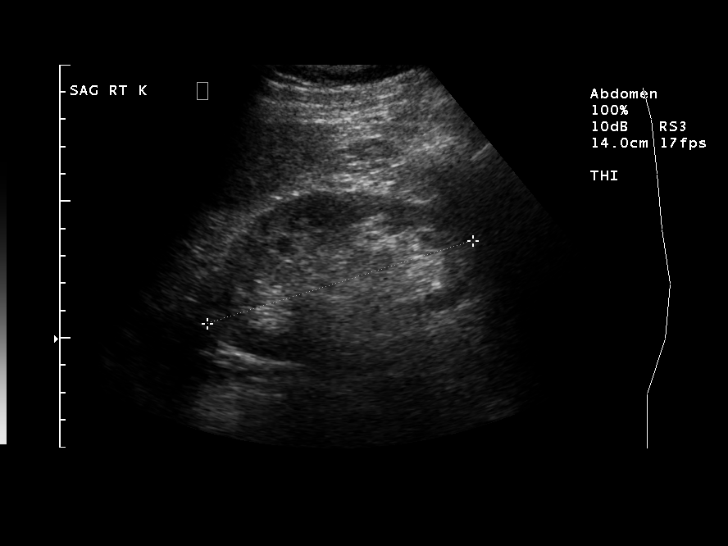
[im 46/56]
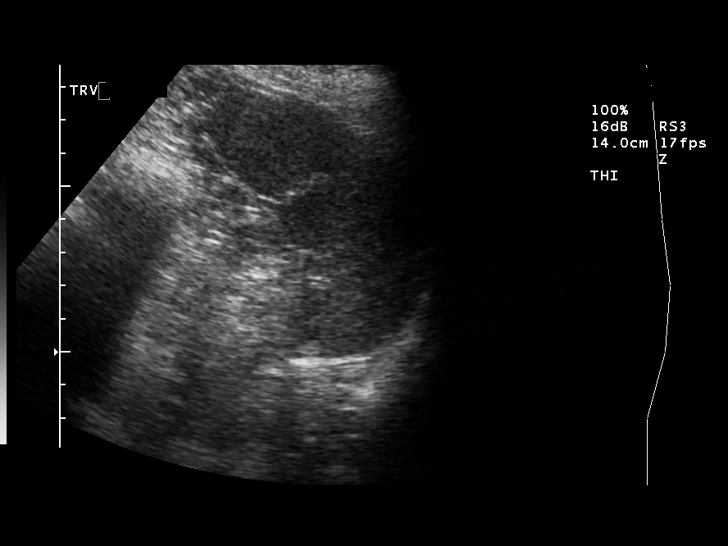
[im 51/56]
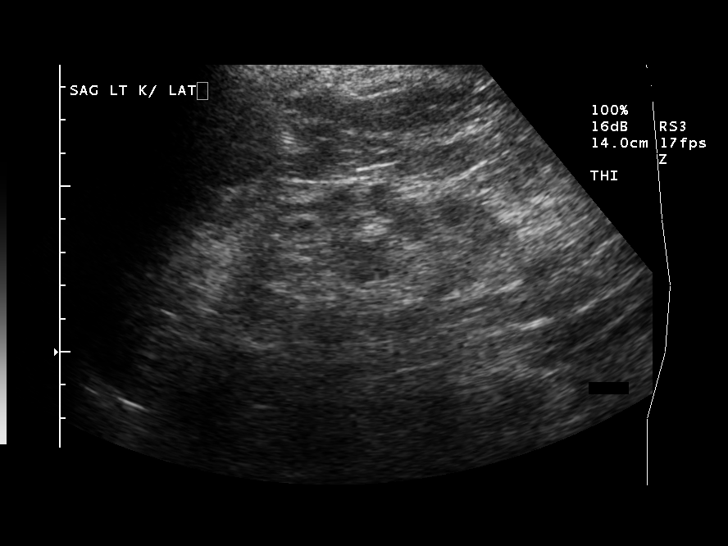
[im 56/56]
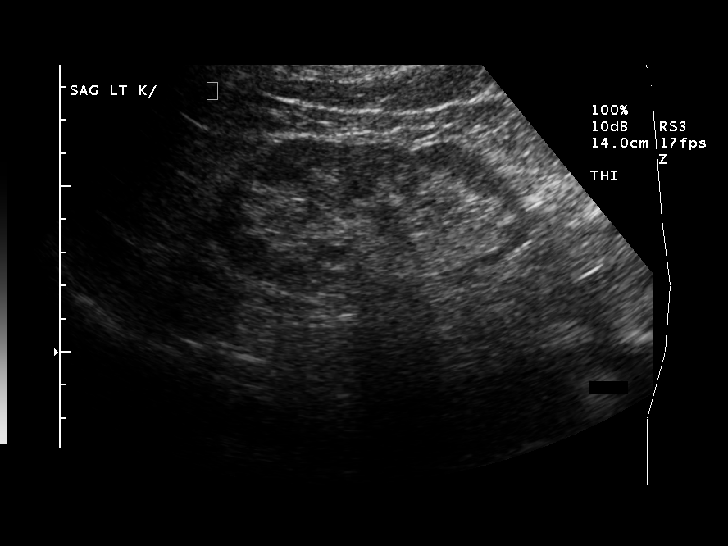

[14 of 25 positions shown; findings below may reference images not displayed]

FINDINGS: Small stone noted within the gallbladder. No wall thickening or
sonographic Johanna Mae. Common bile duct normal at 3 mm

Visualized liver and right kidney unremarkable. Mild cortical thinning within
the left kidney. No hydronephrosis.

Abdominal aorta, pancreas, and IVC not visualized due to overlying bowel gas.

Complex cystic mass noted in the right side of the abdomen, measuring 17 x 12 x
9 cm.

IMPRESSION

Small gallstone. No sonographic evidence of acute cholecystitis. 

17 cm cystic mass within the right side of the abdomen as seen on prior CT.
Differential considerations would include hematoma, abscess, or necrotic tumor.

## 2008-05-07 IMAGING — CR DG ABDOMEN 1V
1 series · 1 of 1 positions shown · non-contrast
Comparison: CT abdomen 07/15/07.

CLINICAL DATA: Nausea and vomiting.
 ABDOMEN ? 1 VIEW:

[t abdomen supine]
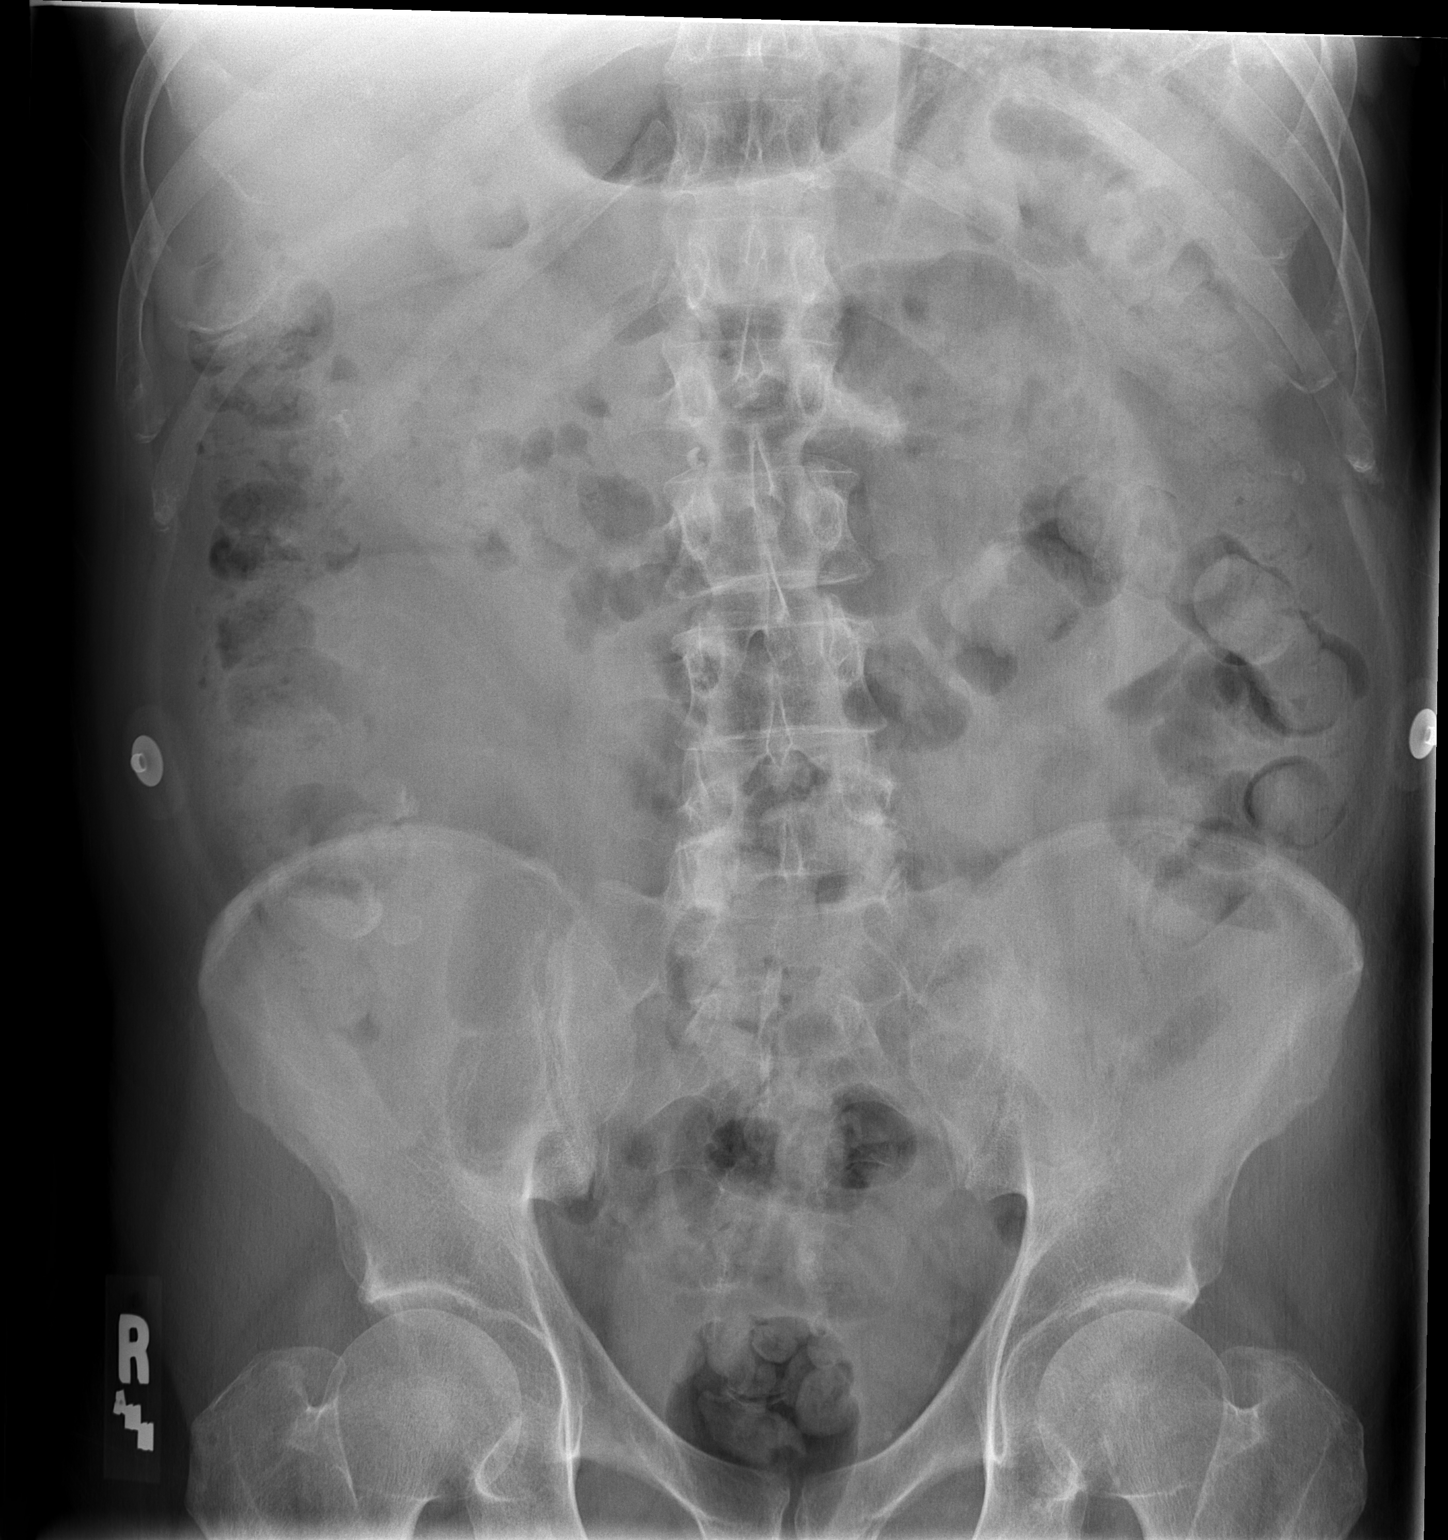

[1 of 1 positions shown; findings below may reference images not displayed]

FINDINGS: A single supine view of the abdomen was performed.  No overt bowel obstruction is present.  There is a single air-filled and dilated loop of small bowel in the left mid abdomen, likely representing a loop of jejunum.  This measures approximately 5 cm in greatest diameter.  This is not felt to represent the colon.  This may represent a focal ileus or very early signs of partial small bowel obstruction.  Correlation with abdominal CT may be helpful if there is any progression of symptoms.  The study is not sensitive to detect free air, as an upright film was not performed.
IMPRESSION: Single dilated and prominent small bowel loop in the left mid abdomen, likely representing a loop of jejunum.  This represents either focal ileus or early signs of partial small bowel obstruction.  No overt obstruction is present at this time.  Correlation with CT of the abdomen may be helpful if there are progressive symptoms.

## 2008-05-07 IMAGING — CR DG CHEST 1V PORT
1 series · 1 of 1 positions shown · non-contrast
Comparison: 07/15/2007

CLINICAL DATA: Shortness of breath

PORTABLE CHEST - 1 VIEW:

[AP]
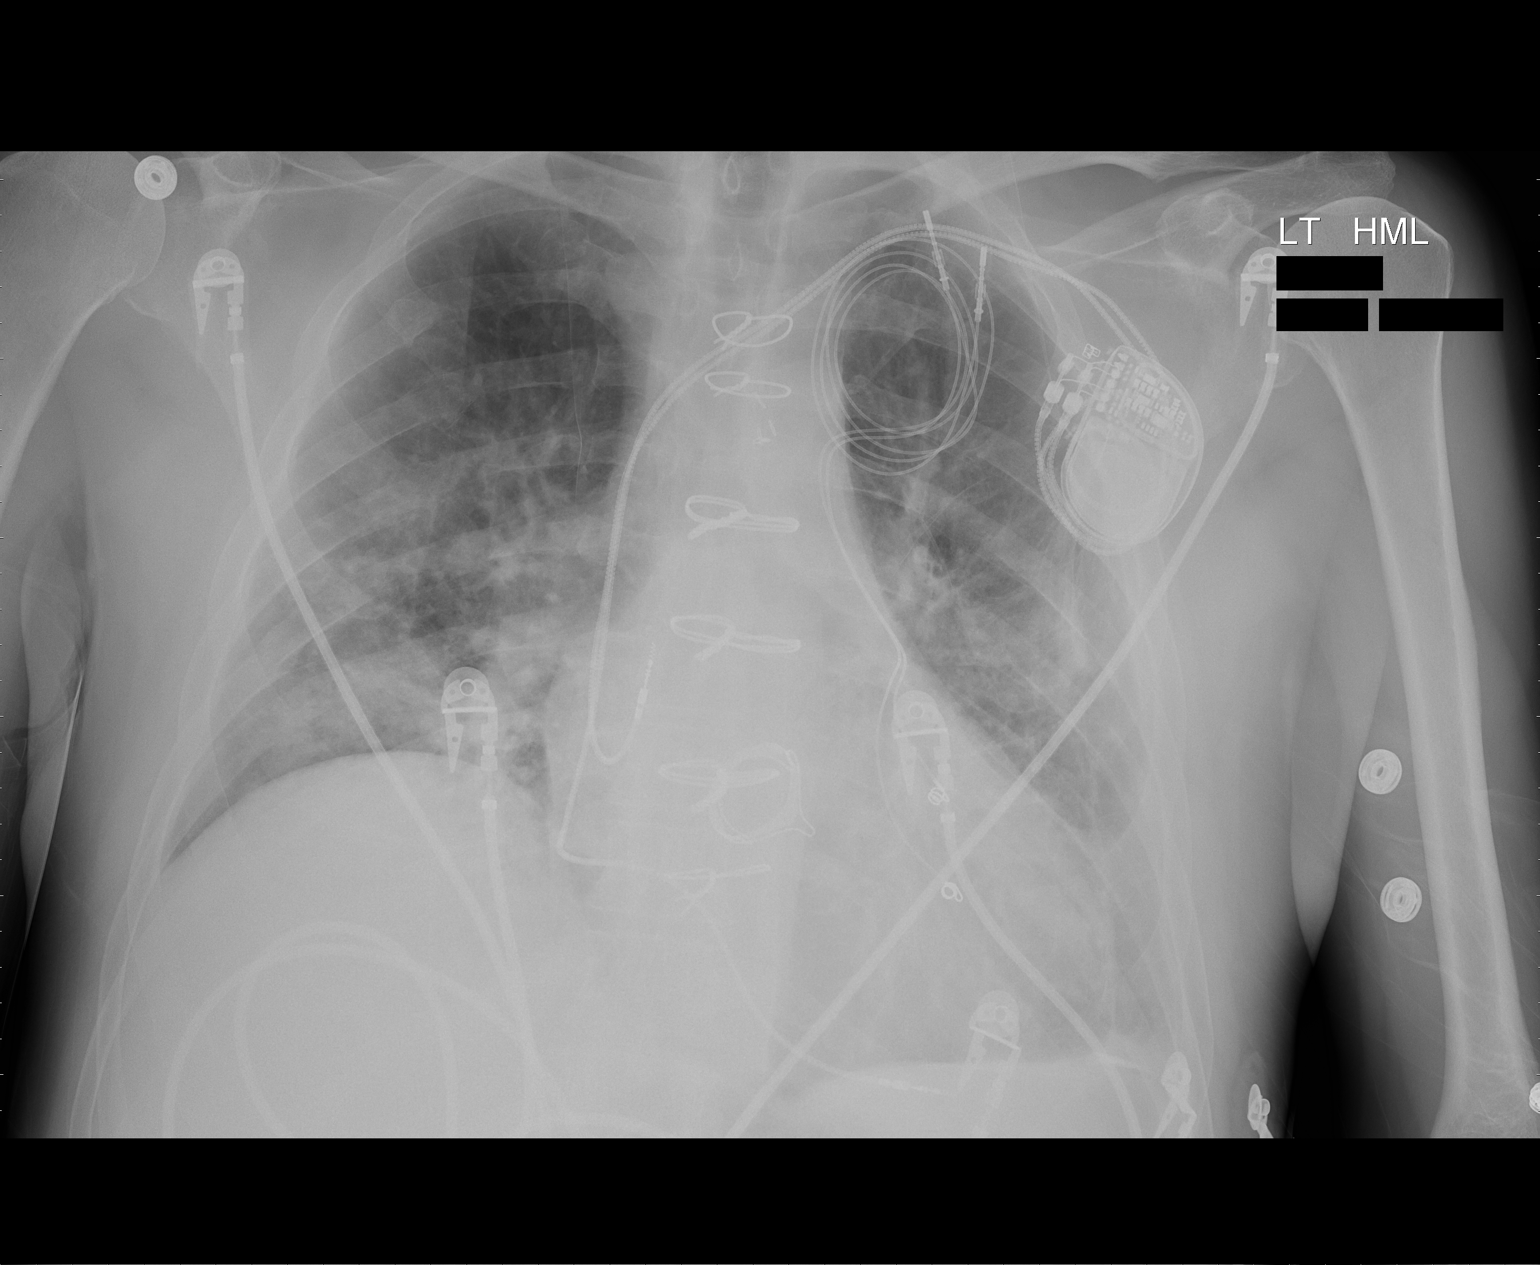

[1 of 1 positions shown; findings below may reference images not displayed]

FINDINGS: Left-sided pacer remains in place, unchanged. There is cardiomegaly.
New bilateral airspace opacities, likely edema. No effusions.
IMPRESSION: Mild CHF.

## 2008-05-09 IMAGING — CR DG CHEST 2V
2 series · 2 of 2 positions shown · non-contrast
Comparison: 07/31/2007

CLINICAL DATA: CHF. Cough. Pneumonia.

[w chest pa]
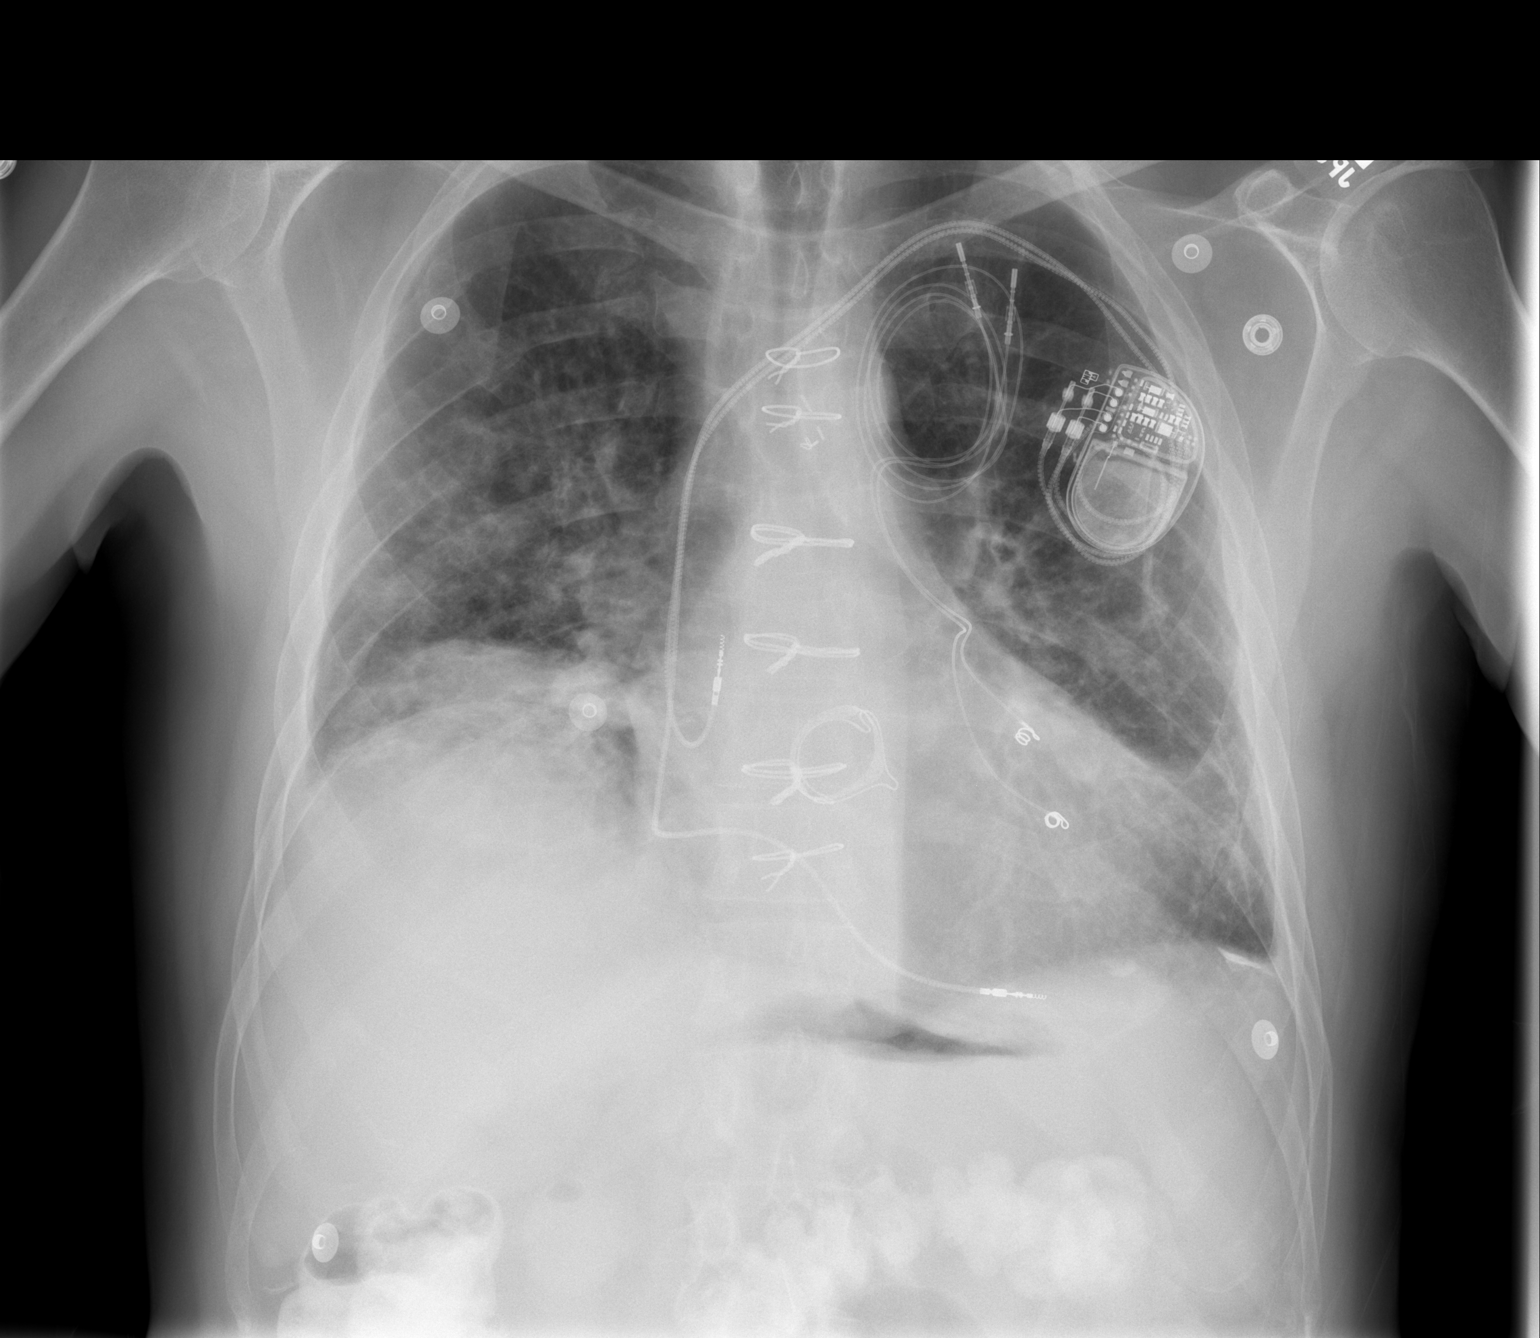

[w chest lat]
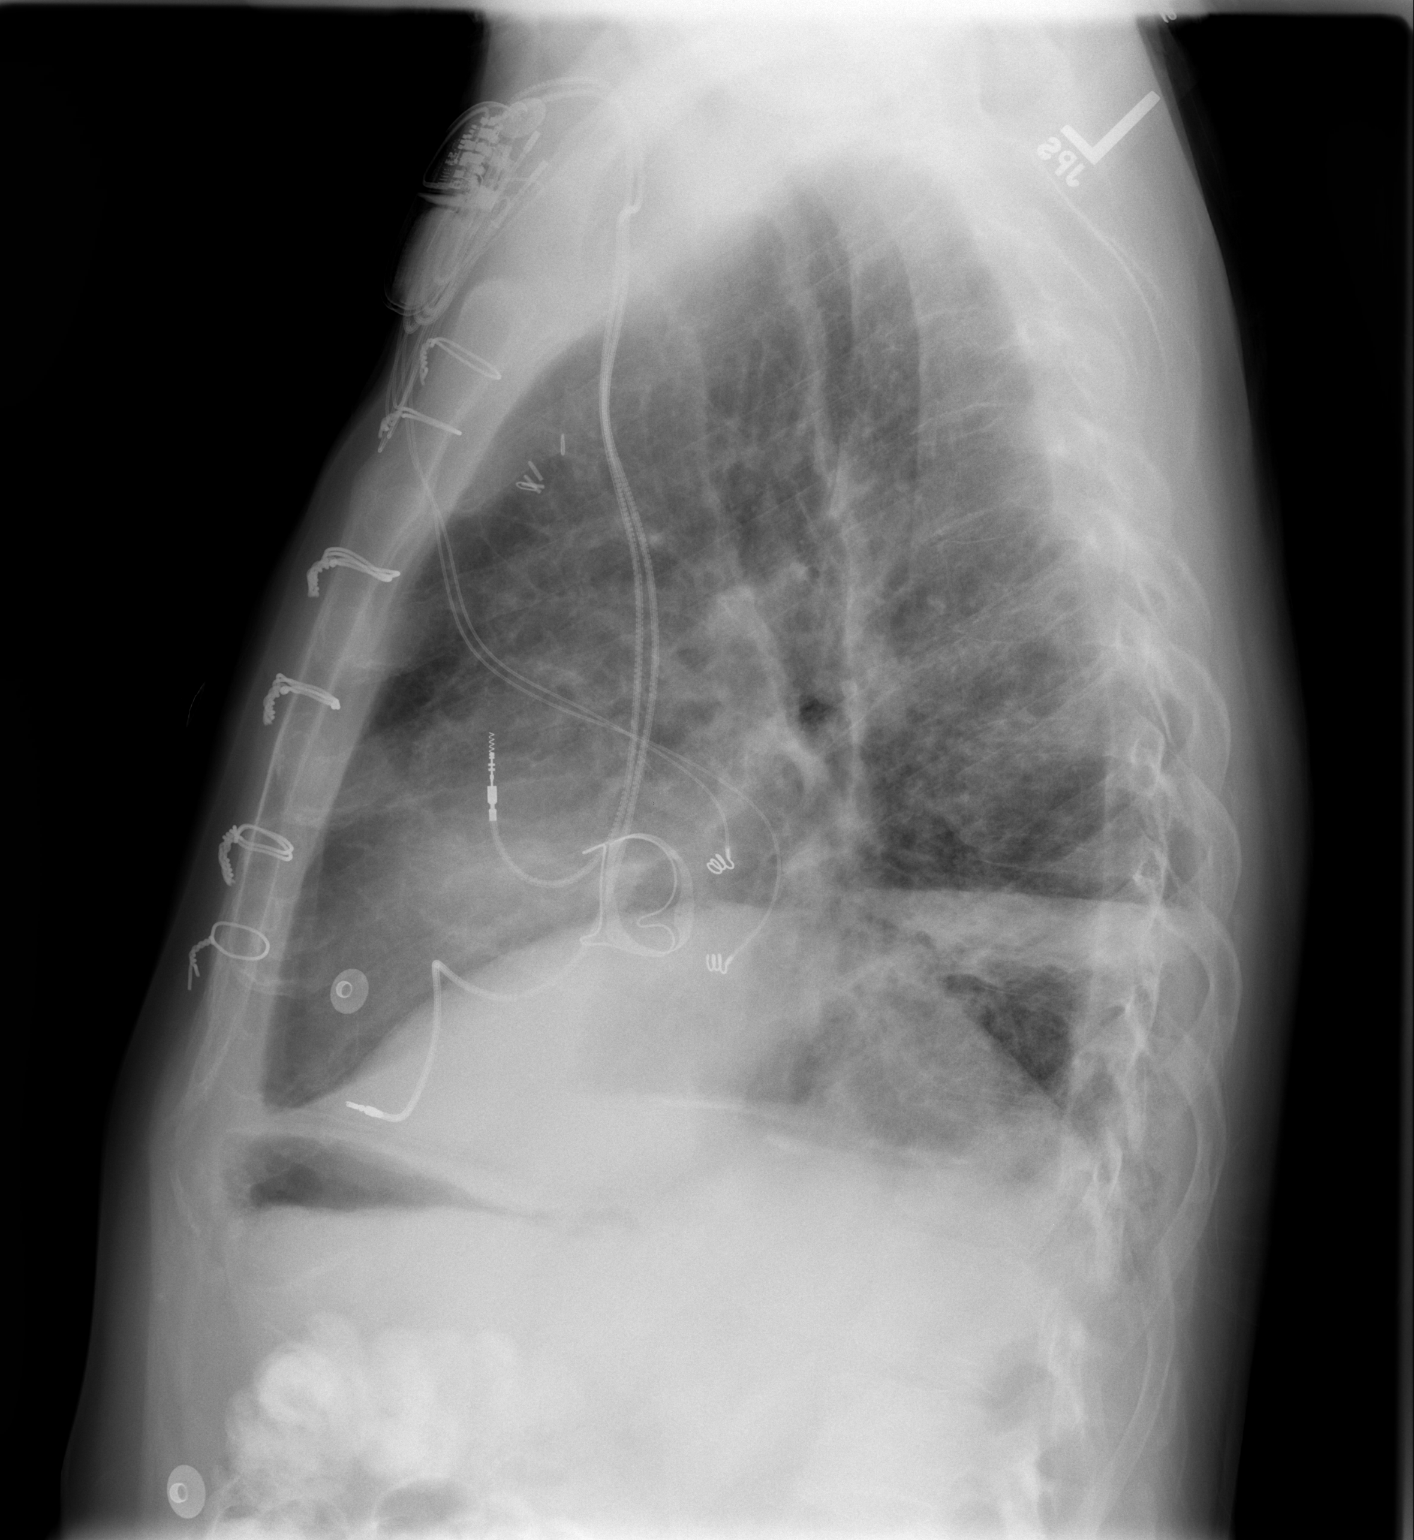

[2 of 2 positions shown; findings below may reference images not displayed]

CHEST - 2 VIEW:

Cardiopericardial silhouette is enlarged and there is asymmetric elevation of
the right hemidiaphragm as before. Atelectasis/airspace disease is seen at the
right lung base. There is diffuse interstitial opacity suggesting interstitial
pulmonary edema. A permanent pacemaker again noted in this patient status post
CABG and aortic valve replacement.
IMPRESSION: Interval increase in focal collapse / consolidation at the right lung base.

Interstitial pulmonary edema.

## 2008-05-12 IMAGING — CR DG CHEST 2V
2 series · 2 of 2 positions shown · non-contrast
Comparison: 08/02/07.

CLINICAL DATA: CHF. 
 CHEST ? 2 VIEW:

[w chest pa]
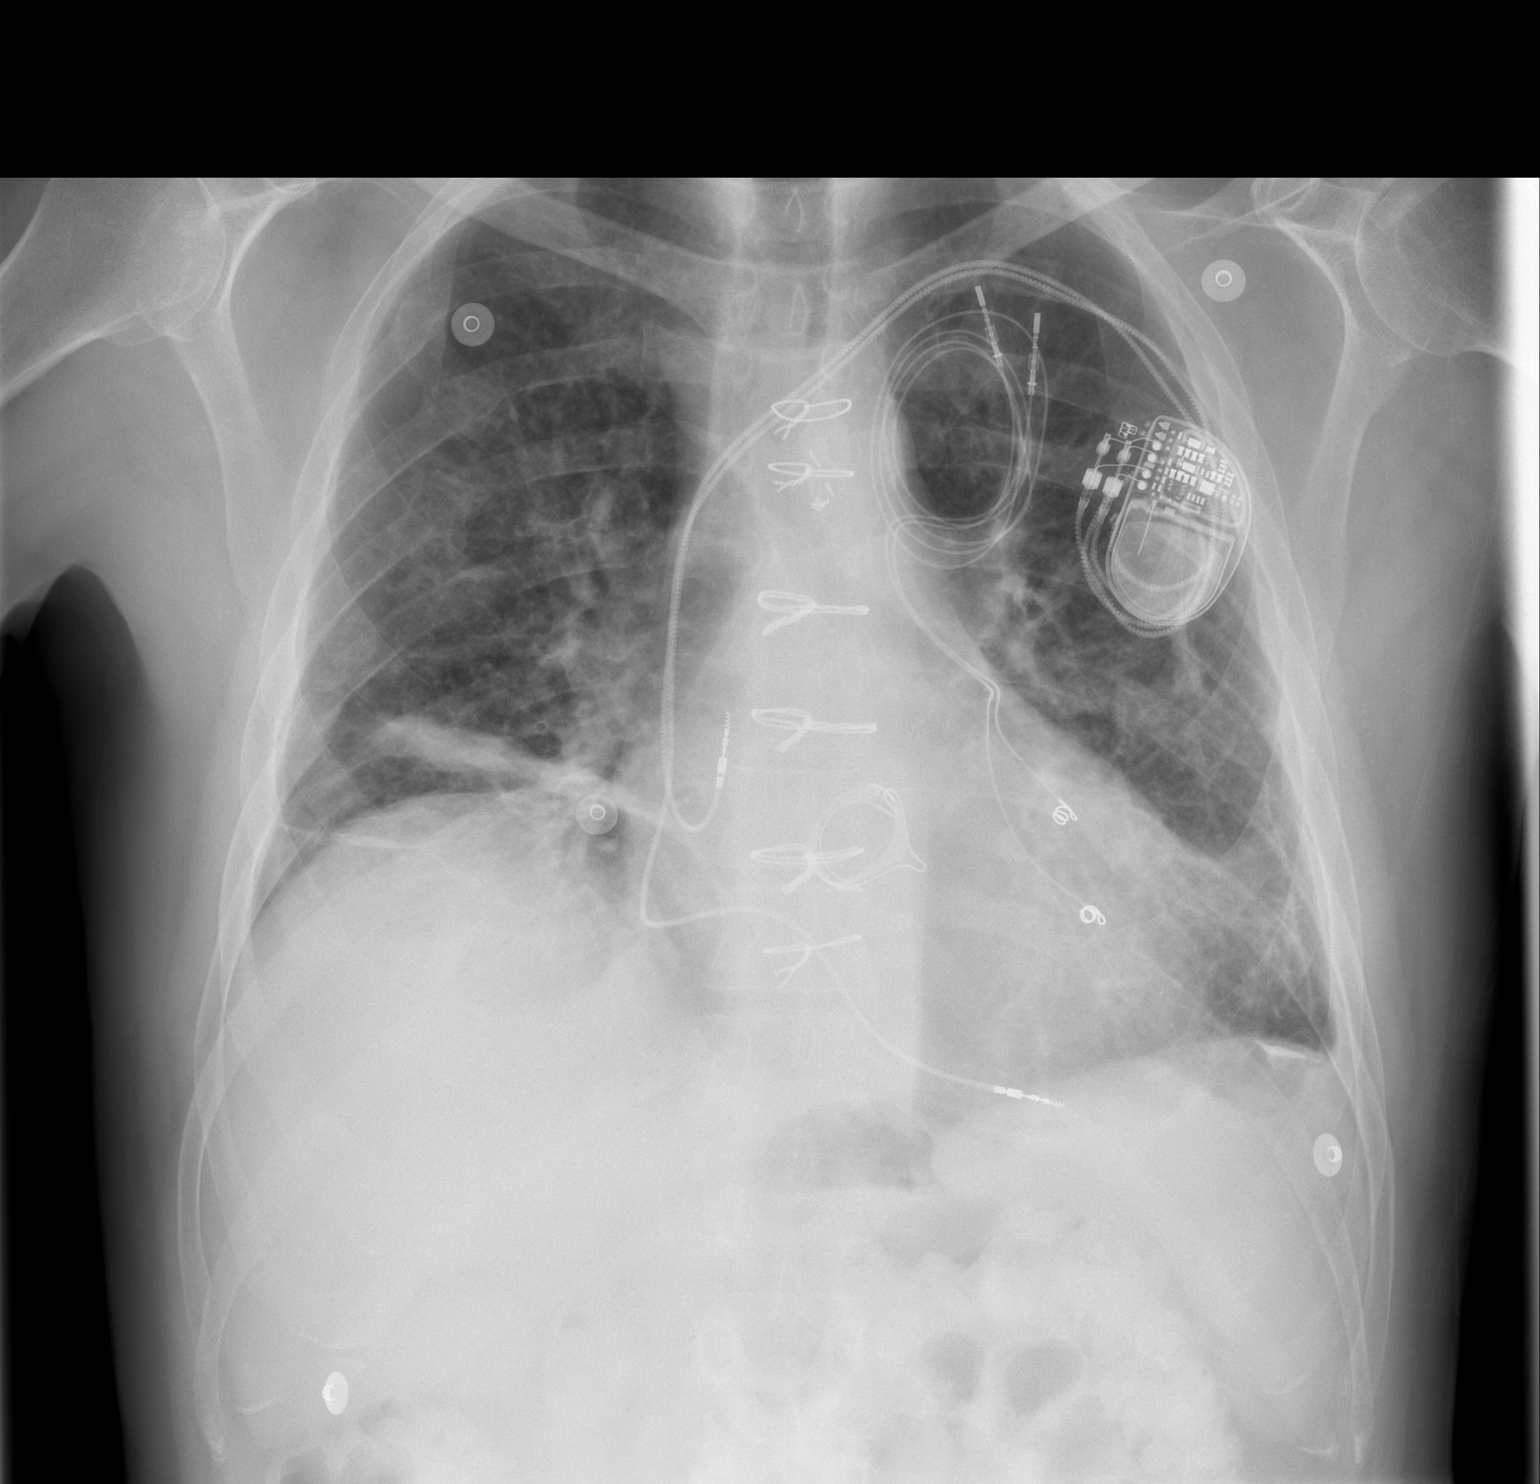

[w chest lat]
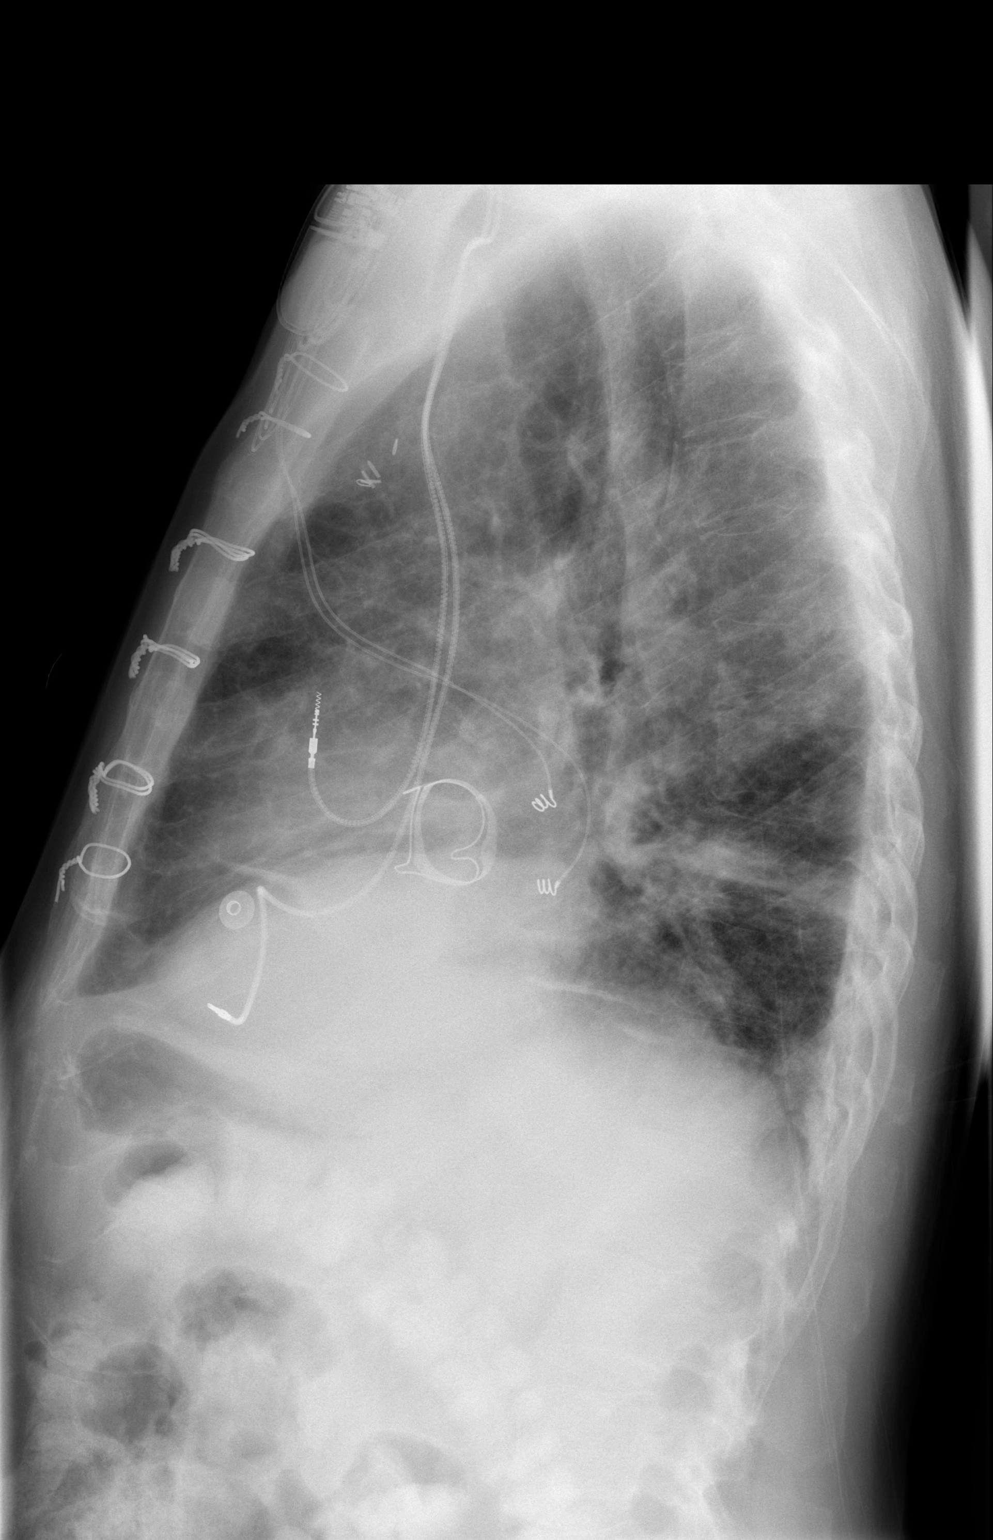

[2 of 2 positions shown; findings below may reference images not displayed]

FINDINGS: Mild improvement in right lower lobe atelectasis.  There is also mild improvement in pulmonary edema.  There is a small left effusion.
IMPRESSION: 1.  Improving edema. 
 2.  Improving right lower lobe atelectasis.

## 2008-07-06 IMAGING — CR DG CHEST 2V
2 series · 2 of 2 positions shown · non-contrast
Comparison: [REDACTED] chest x-ray 08/05/2007 and CT angio chest
05/02/2007 and [REDACTED] chest x-ray 09/10/2006

CLINICAL DATA: Cough, rhonchi on physical exam, former smoker,
pacemaker and cardiac valve replacement [DATE]

CHEST - 2 VIEW

[w chest pa]
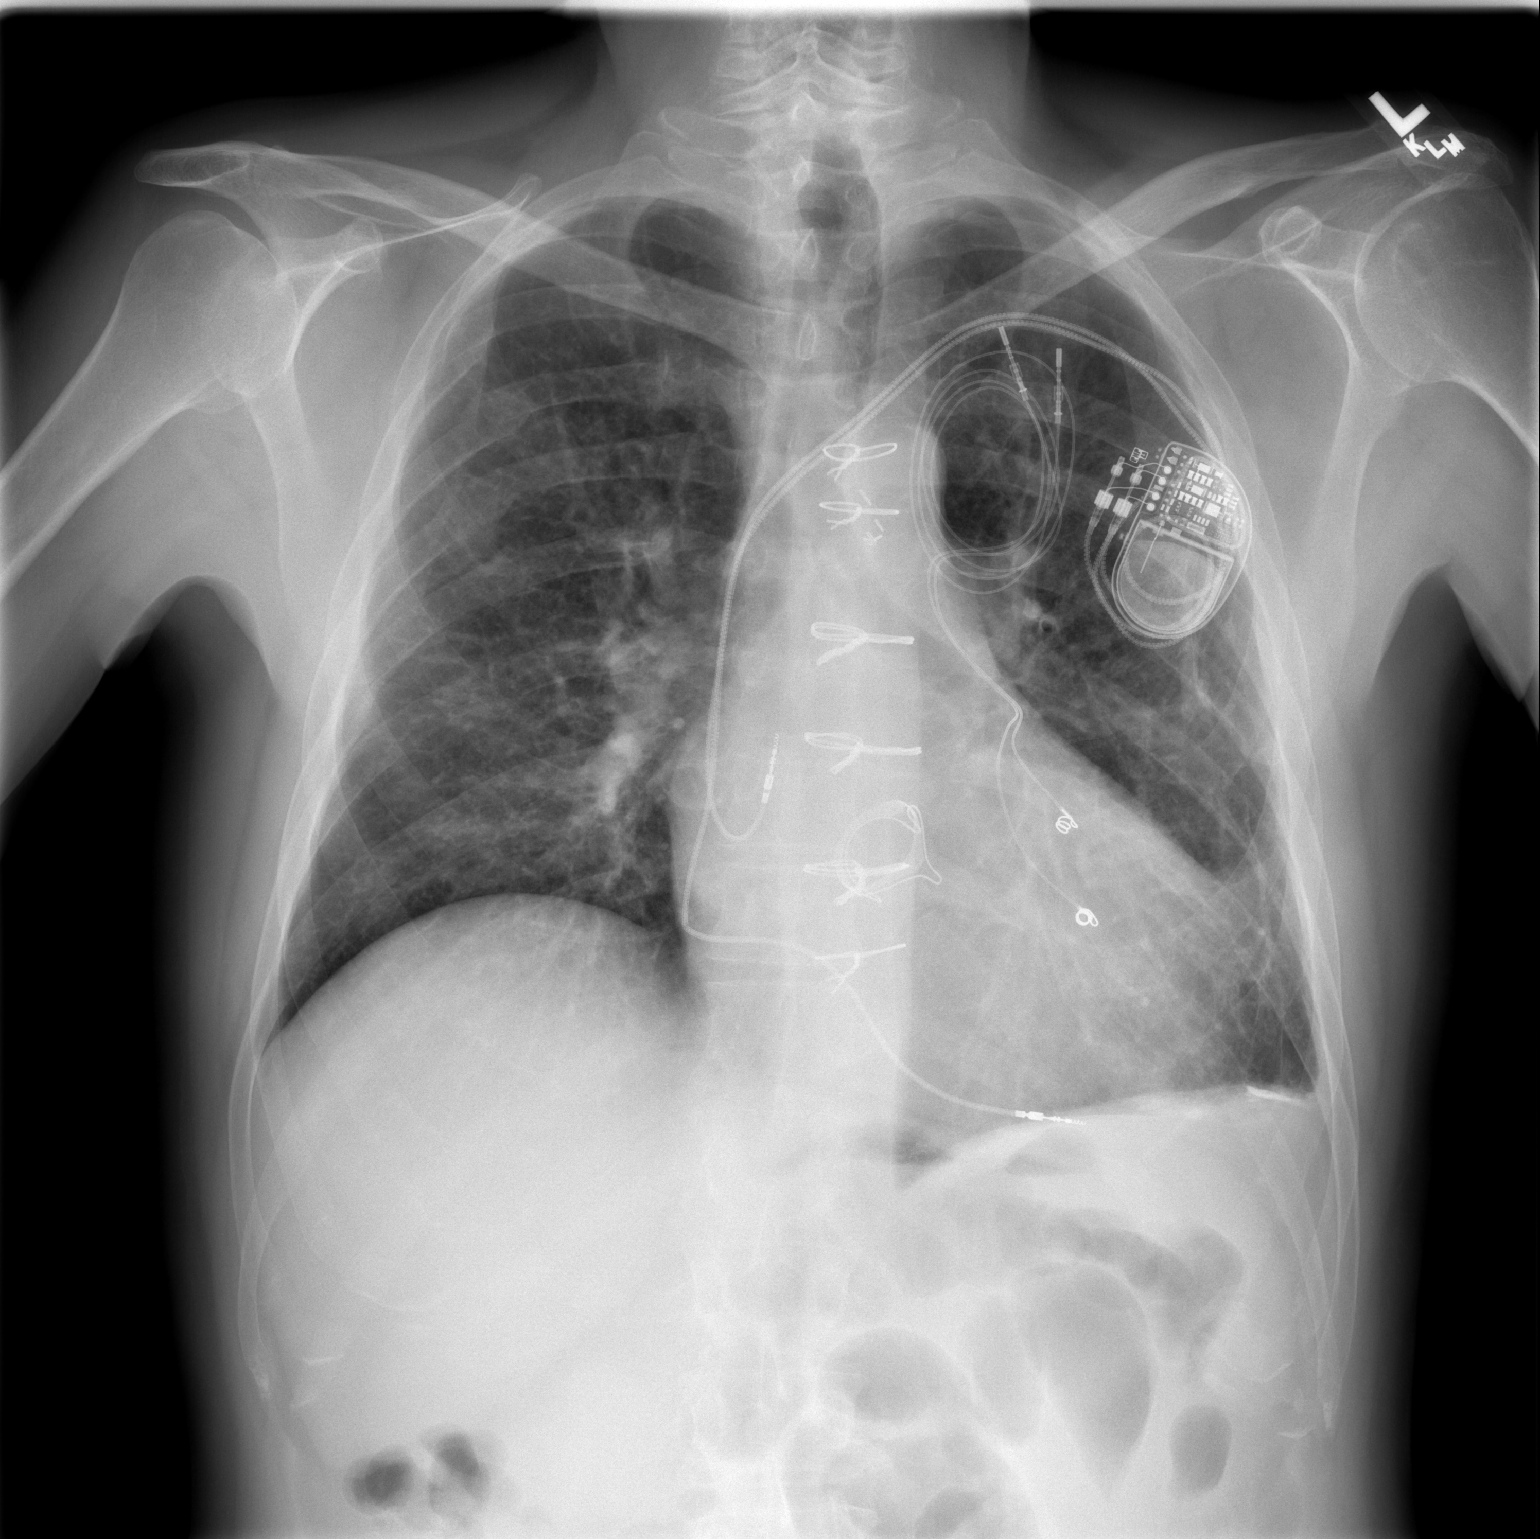

[w chest lat]
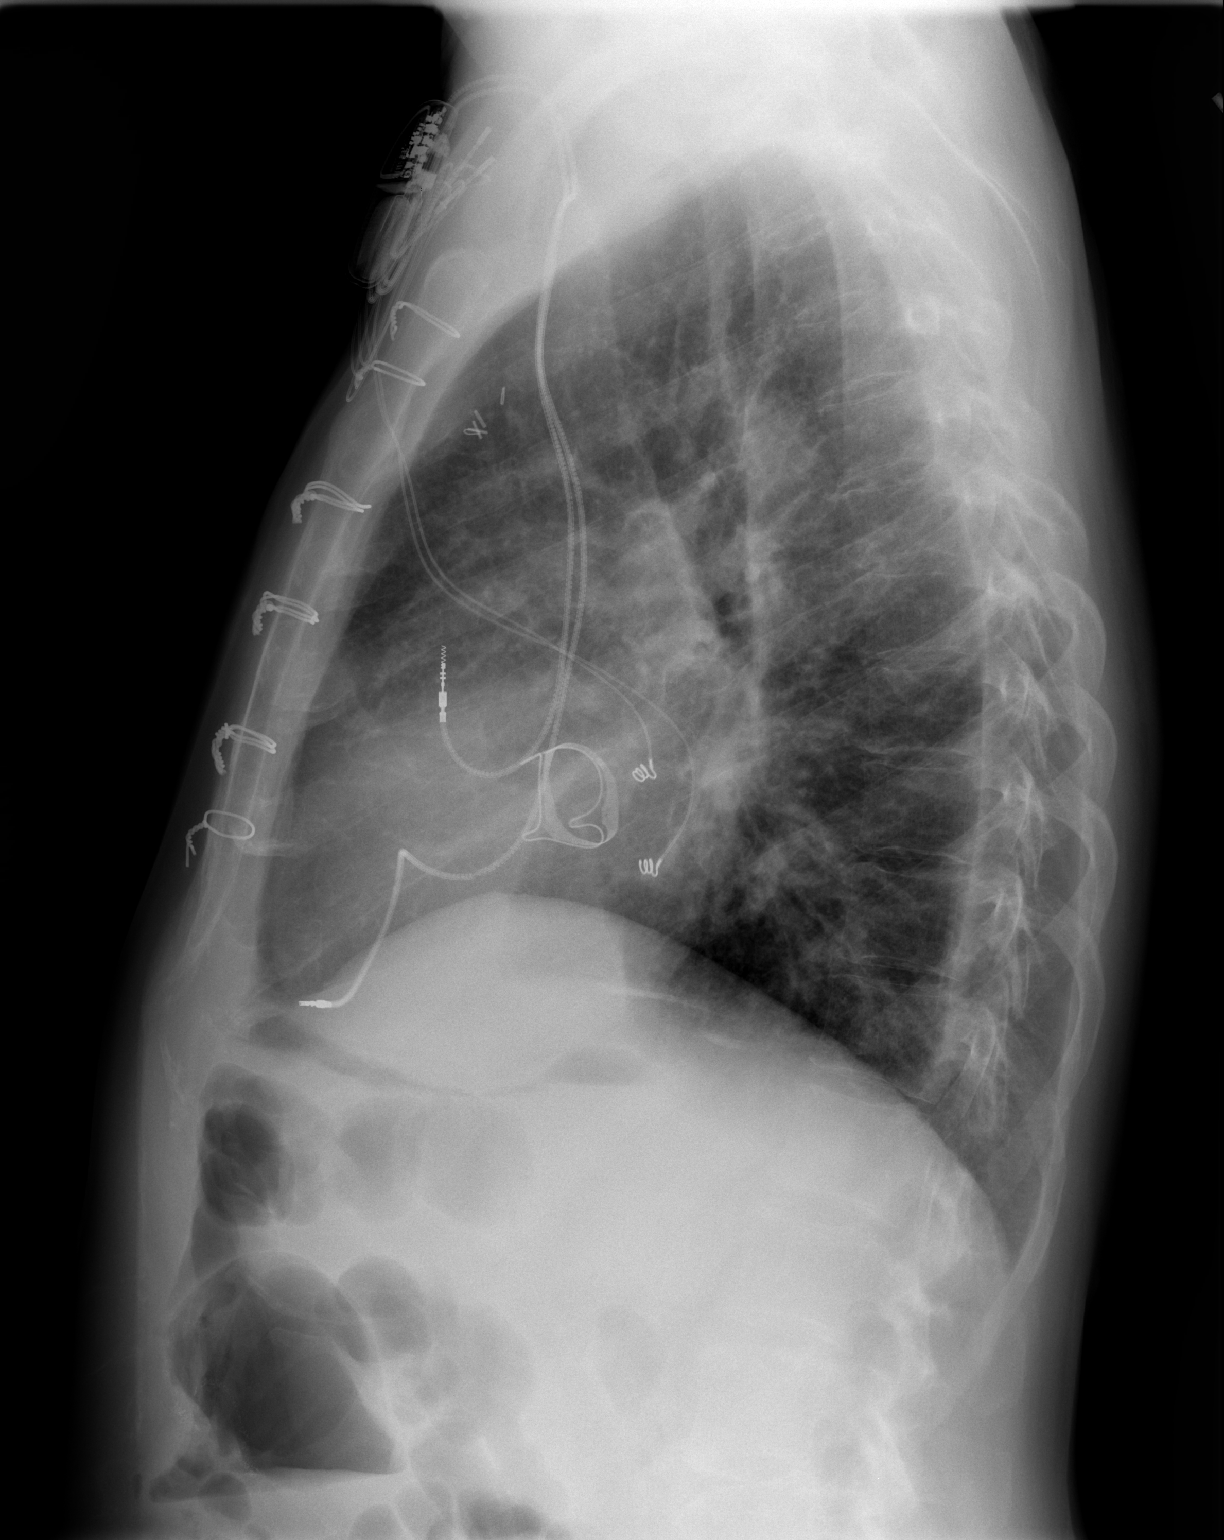

[2 of 2 positions shown; findings below may reference images not displayed]

FINDINGS: Stable generalized prominence bronchopulmonary markings
noted since [REDACTED] chest x-ray 09/10/2006 consistent with chronic
asthma or bronchitis.  Stable slight focal benign-appearing
calcification lateral left hemidiaphragm noted.  Lungs are free of
acute infiltrates or edema.  Stable slight cardiomegaly, dual lead
left-sided permanent cardiac pacemaker with epicardial leads and
mitral valve replacement noted.  No new significant abnormality is
seen.
IMPRESSION: 1.  Findings consistent with diffuse chronic asthma or bronchitis
without focal infiltrate or edema.
2.  Stable slight cardiomegaly and mitral valve replacement with
permanent cardiac pacemaker.
3.  No acute findings.

## 2008-07-11 IMAGING — CR DG ABDOMEN ACUTE W/ 1V CHEST
3 series · 3 of 3 positions shown · non-contrast
Comparison: CT abdomen 08/01/2007

CLINICAL DATA: Abdominal pain, question swelling

ACUTE ABDOMEN SERIES (ABDOMEN 2 VIEW & CHEST 1 VIEW)

[w chest pa]
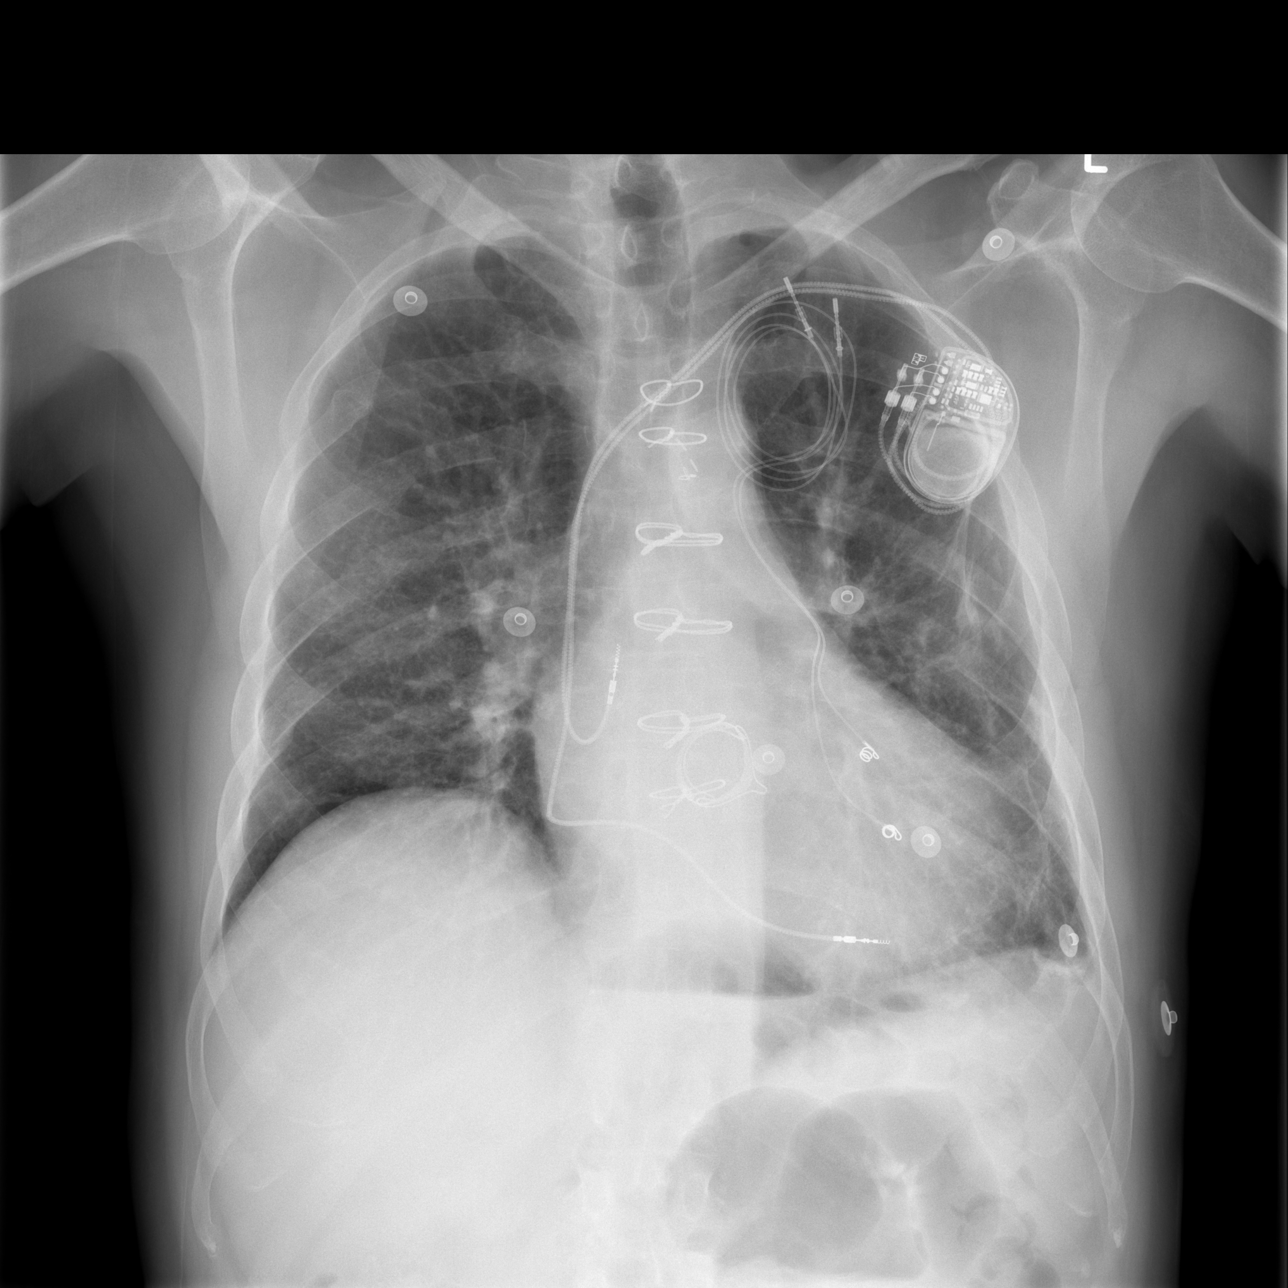

[w abdomen upright]
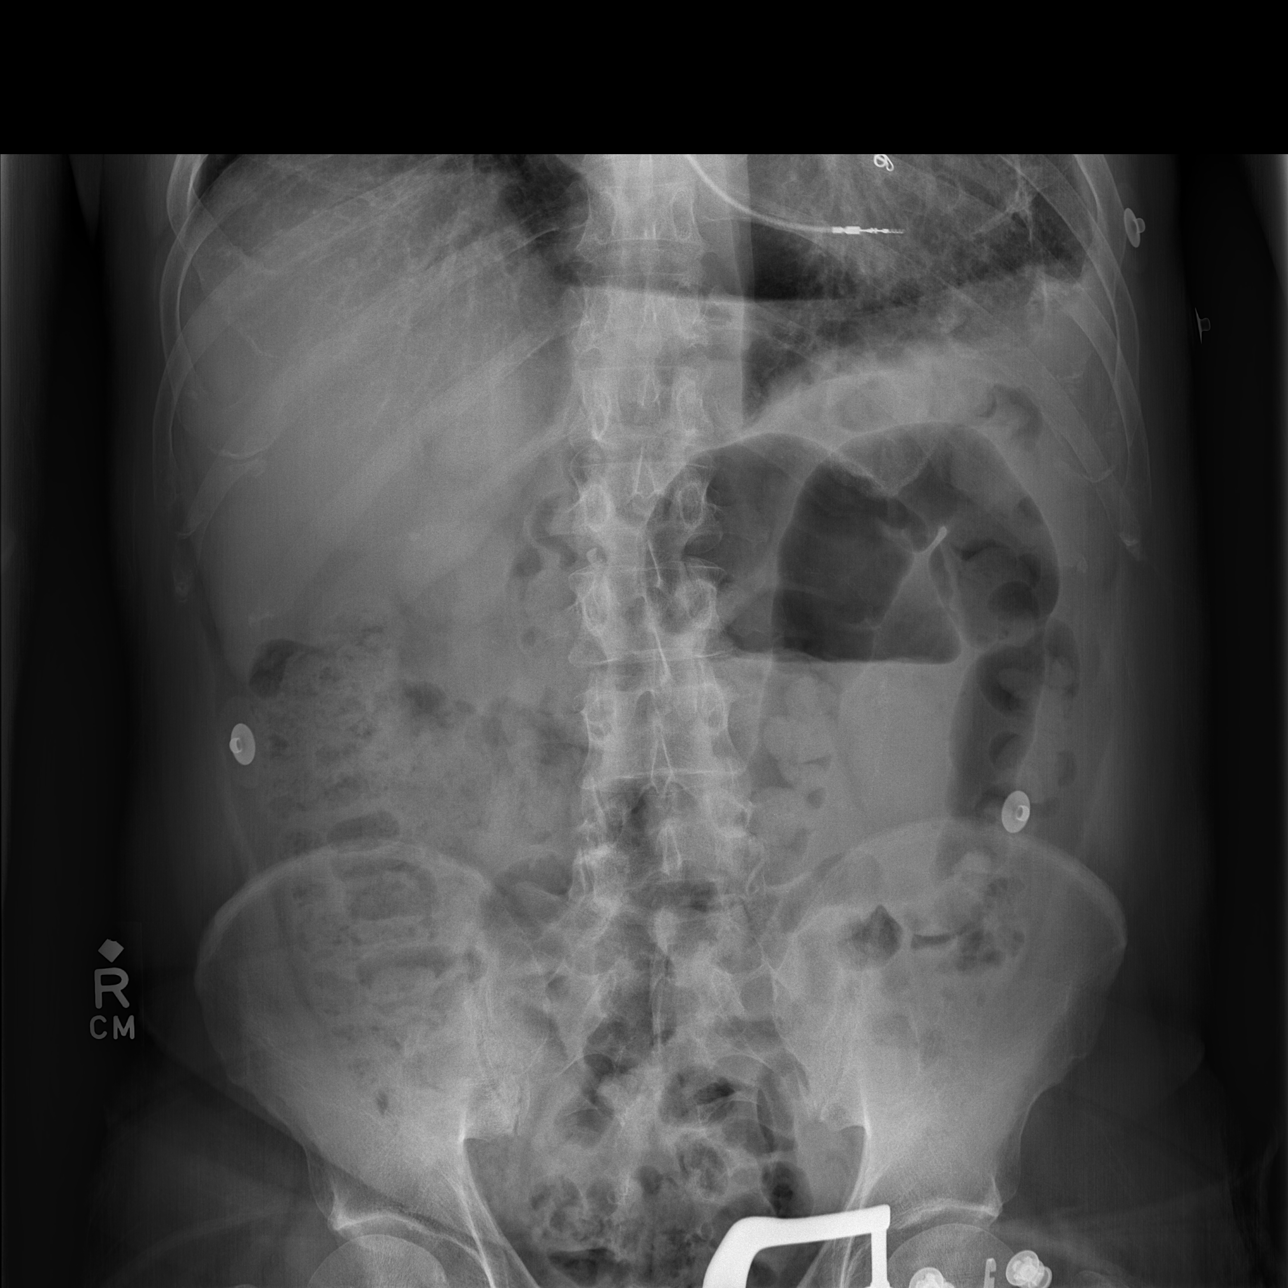

[t abdomen supine]
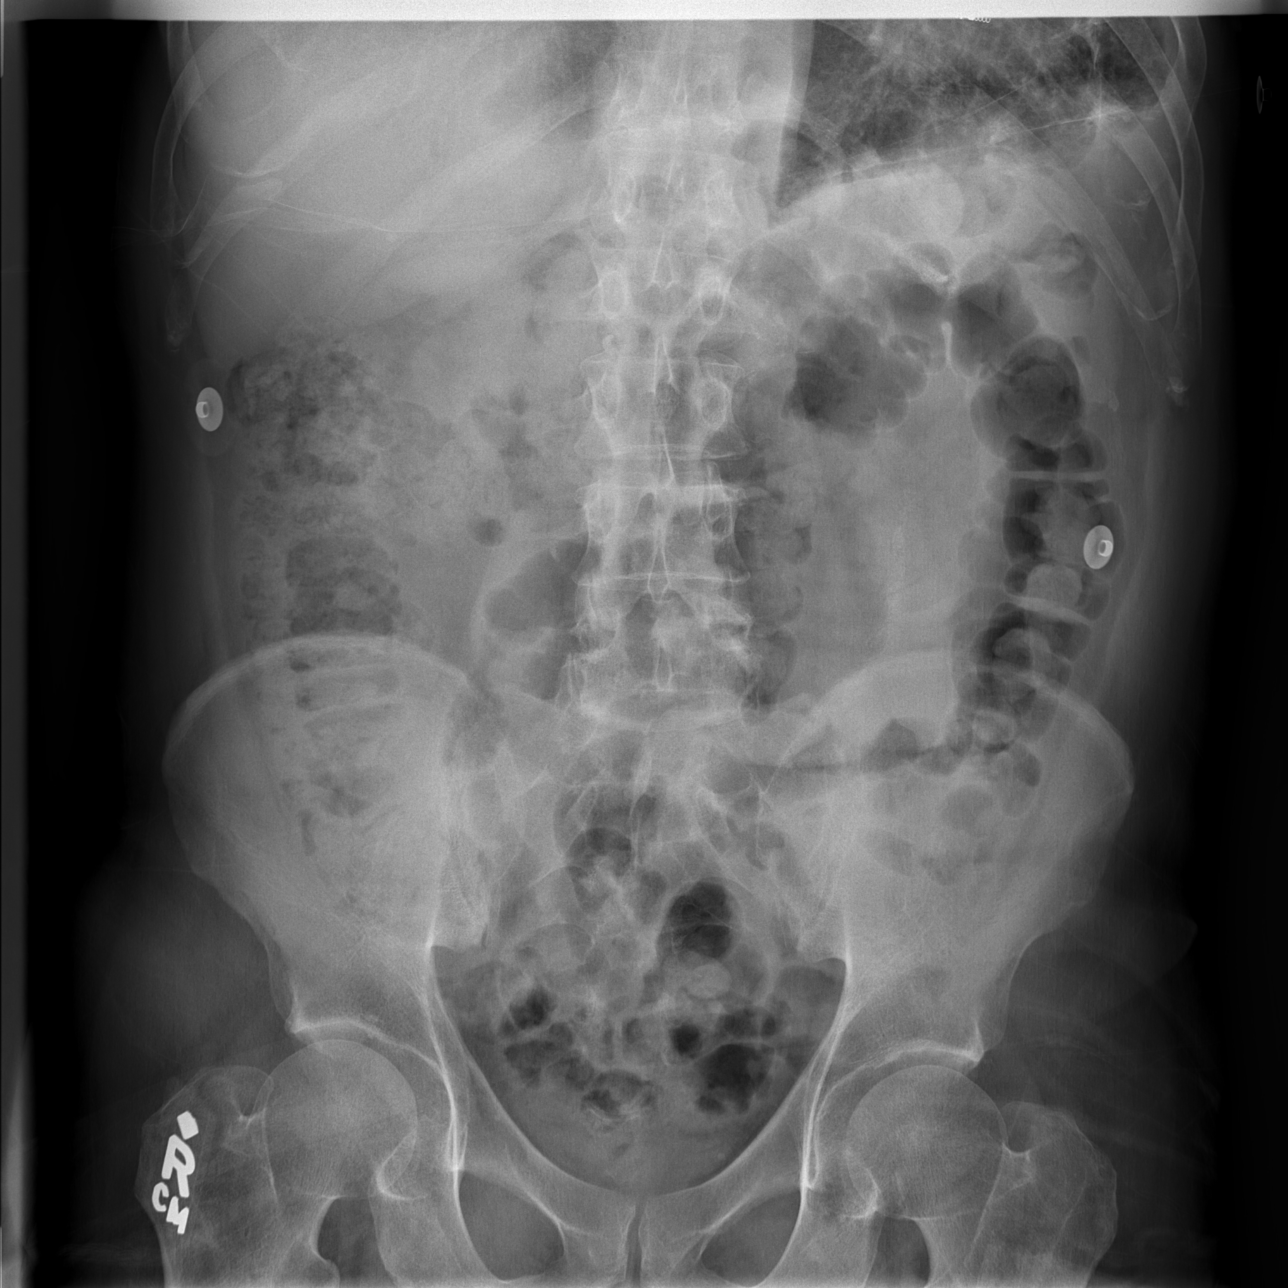

[3 of 3 positions shown; findings below may reference images not displayed]

FINDINGS: Chronic changes of the lungs are noted.  Cardiomegaly.
Median sternotomy and mitral valve replacement.  No acute
cardiopulmonary disease.  Left subclavian dual lead cardiac
pacemaker with residual epicardial pacing leads.  No free air is
present underneath the hemidiaphragms.

Fluid levels are present within the stomach and there is mild
dilation of a loop of small bowel in the left upper quadrant.
Large right-sided fecal burden within the colon.  Gas and stool
identified to the level of the rectosigmoid. A fluid level is
present over the central diaphragm, likely representing a hiatal
hernia.
IMPRESSION: 1.  Stable appearance of the chest.
2.  Mildly dilated loops of small bowel, with air fluid levels.
This could represent partial small bowel obstruction.

## 2008-07-11 IMAGING — CT CT ABDOMEN W/ CM
2 of 5 series · 17 of 46 positions shown, 19 images · IV contrast (APPLIED)
Comparison: Recent prior CTs July 2007.

CT ABDOMEN

CLINICAL DATA: Abdominal pain, abdominal swelling.

CT ABDOMEN AND PELVIS WITH CONTRAST
TECHNIQUE: Multidetector CT imaging of the abdomen and pelvis was
performed using the standard protocol following bolus
administration of intravenous contrast.
Contrast: 100 ml Dmnipaque-YBB.

[Series 2: abd/pelv with 5.0 b31f st · axial · 0.70mm/px · z∈[-362,+33]mm · 14 of 91 slices shown, 16 images]
[im 6/91  soft-tissue]
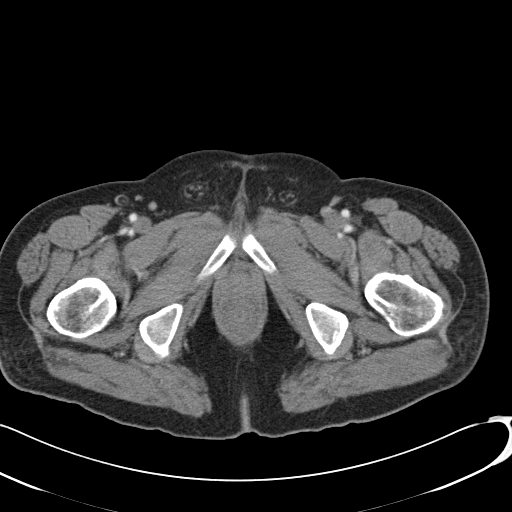
[im 6/91  bone]
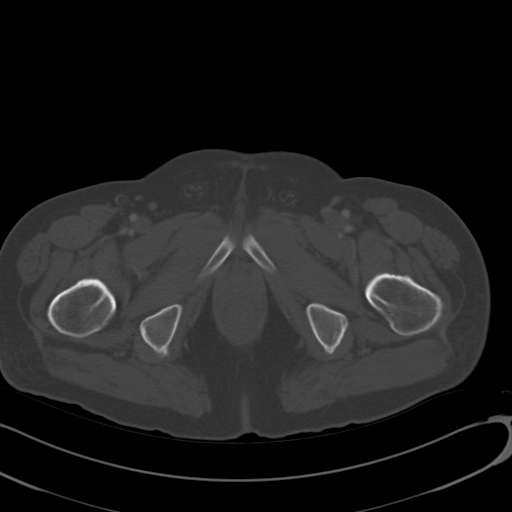
[im 11/91  soft-tissue]
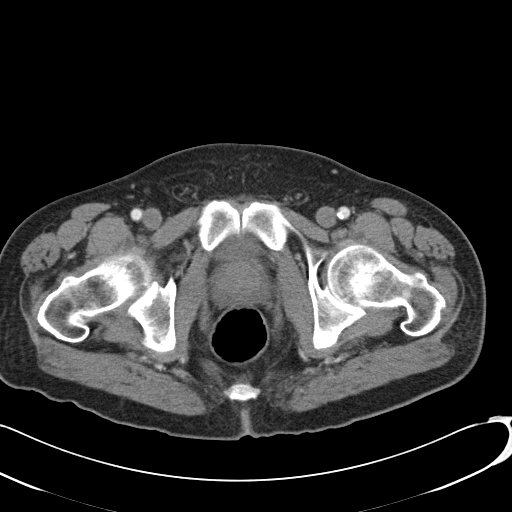
[im 16/91  soft-tissue]
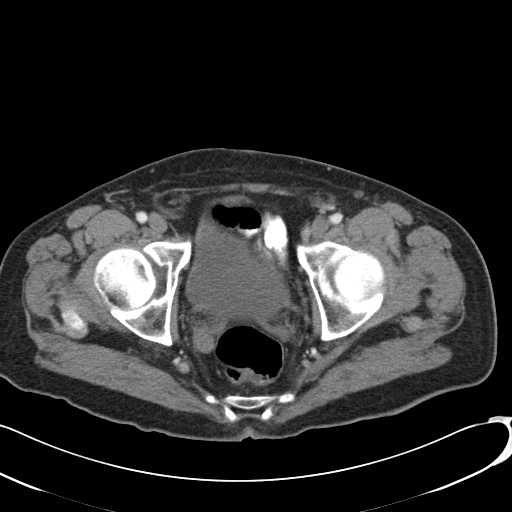
[im 27/91  soft-tissue]
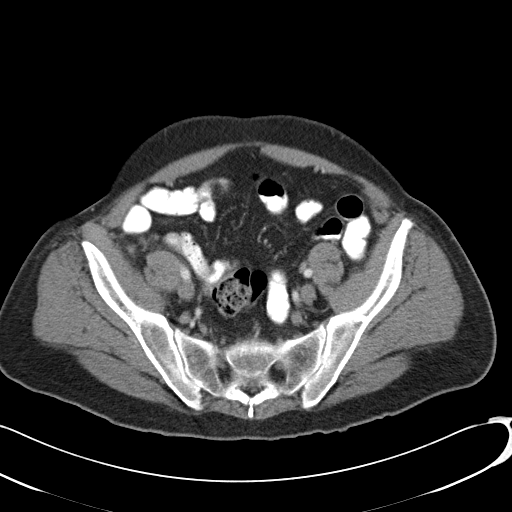
[im 32/91  soft-tissue]
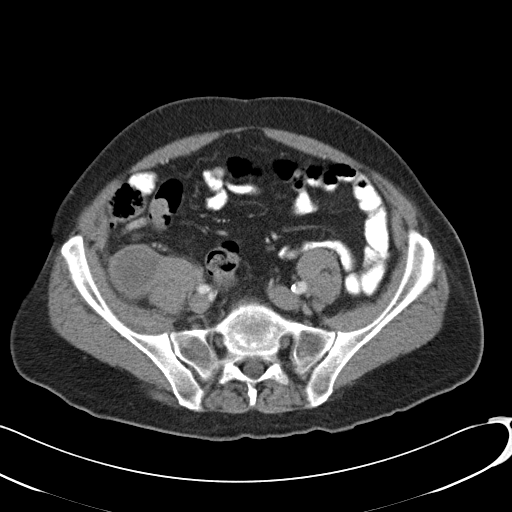
[im 38/91  soft-tissue]
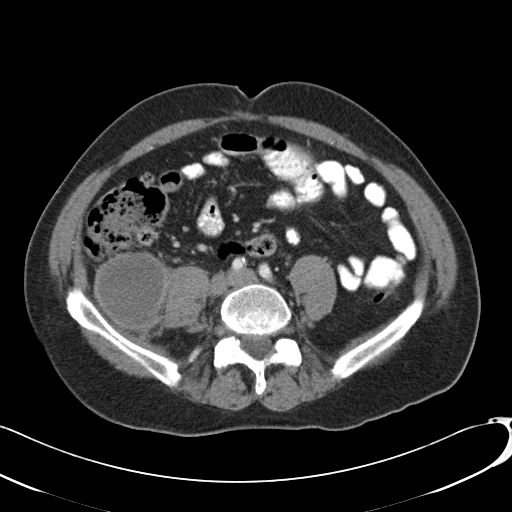
[im 43/91  soft-tissue]
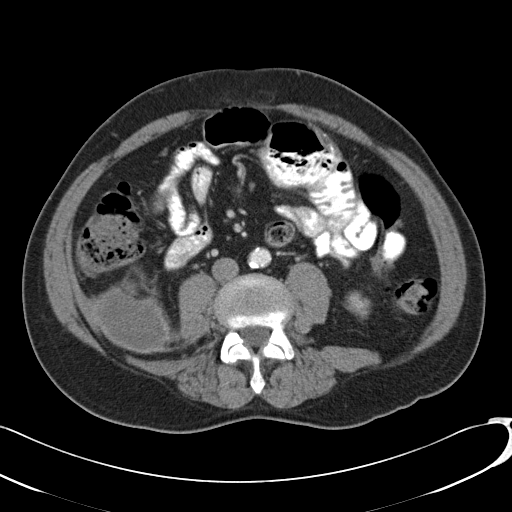
[im 48/91  soft-tissue]
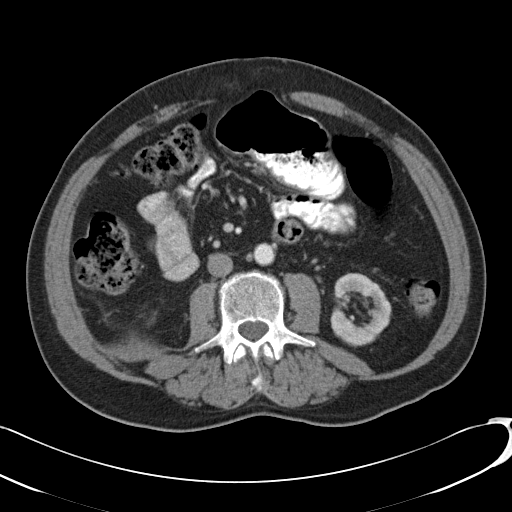
[im 53/91  soft-tissue]
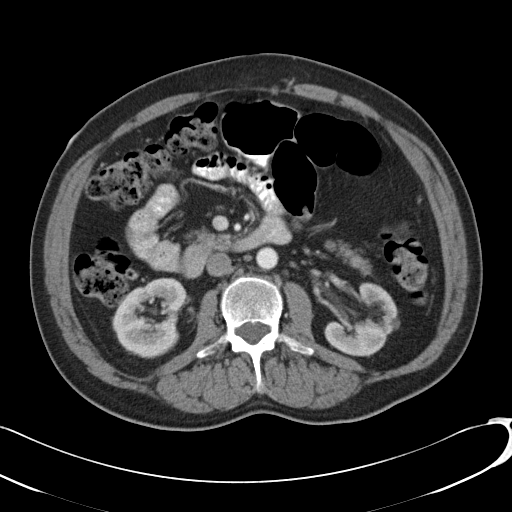
[im 53/91  bone]
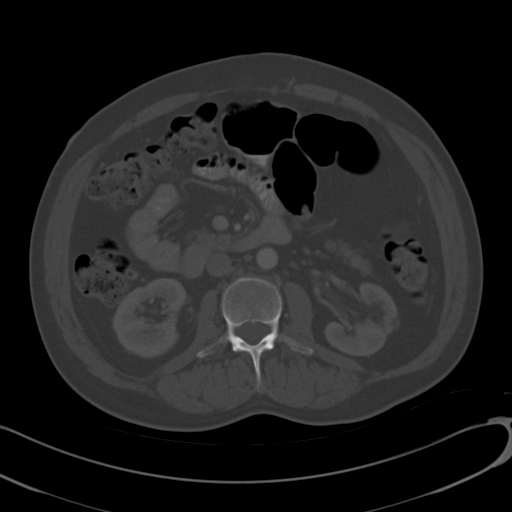
[im 59/91  soft-tissue]
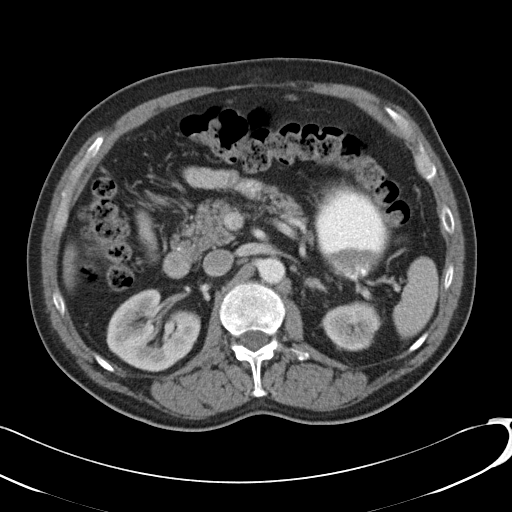
[im 69/91  soft-tissue]
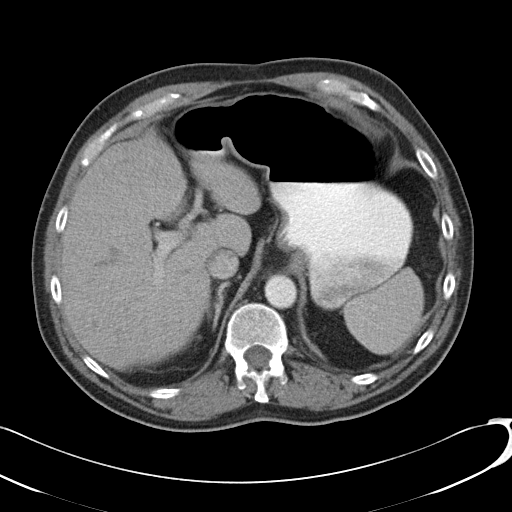
[im 75/91  soft-tissue]
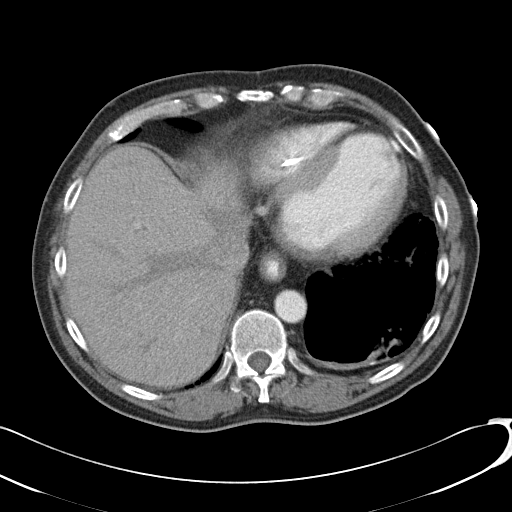
[im 80/91  soft-tissue]
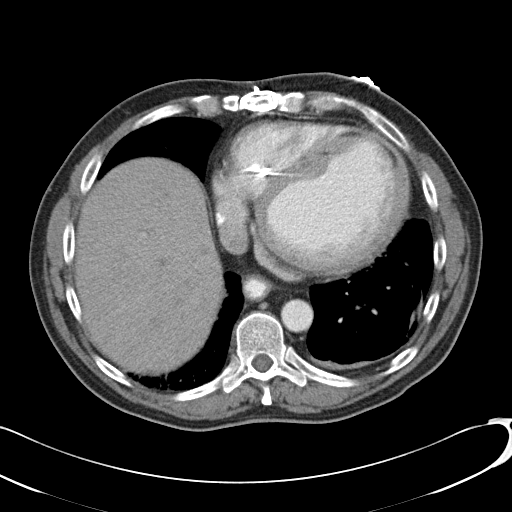
[im 85/91  soft-tissue]
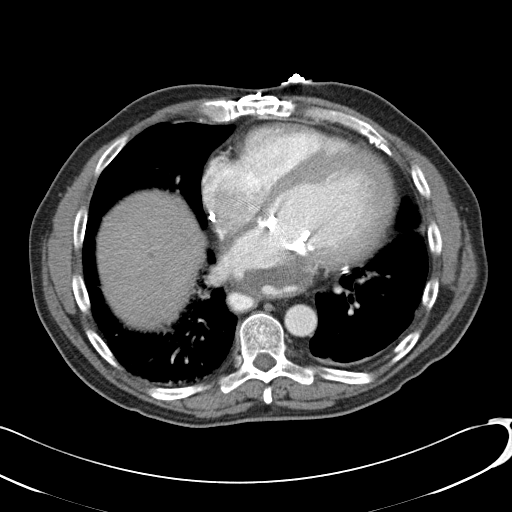

[Series 5: abd/pelv with 2.0 spo cor st · coronal · 0.88mm/px · 3 of 126 slices shown]
[im 42/126  soft-tissue]
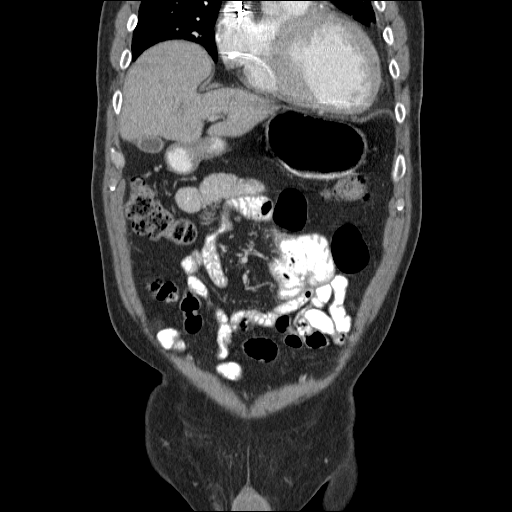
[im 56/126  soft-tissue]
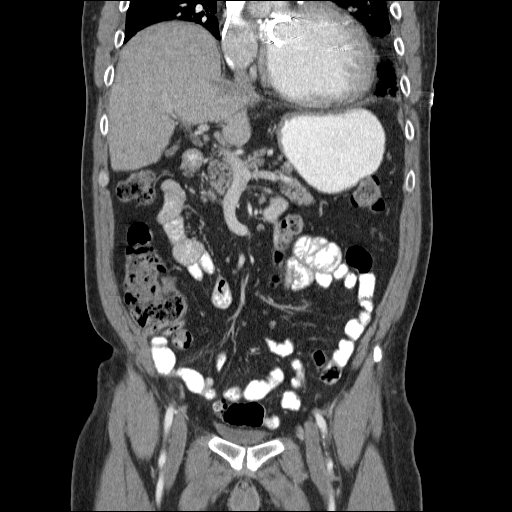
[im 70/126  soft-tissue]
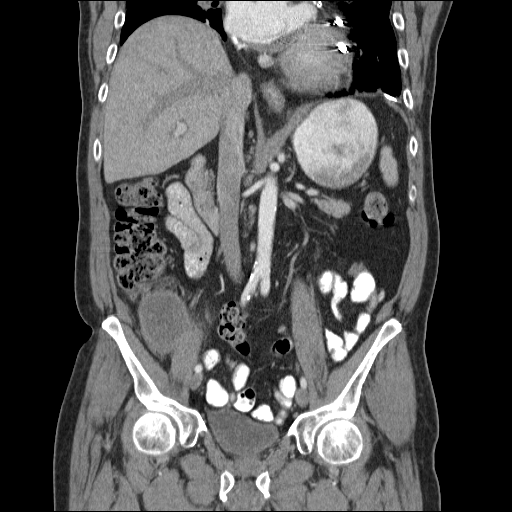

[17 of 46 positions shown; findings below may reference images not displayed]

FINDINGS: Although only partially visualized, the left atrial
thrombus appears somewhat smaller than on the prior examinations.
Mitral annuloplasty.  Coronary artery disease and CABG.
Cholelithiasis.  Liver appears within normal limits.  Pleural
plaques over the left hemidiaphragm.  Stomach and duodenum appear
within normal limits.  Kidneys show normal enhancement.  Small, too
small to characterize interpolar left low density lesion is
present, likely representing renal cysts.  Abdominal aortic
atherosclerosis extending and a iliofemoral system, without
aneurysm.

The right retroperitoneal all fluid-filled structure has continued
to decrease in size, today measuring 5.7 x 4.9 cm at the level of
the right iliac crest.  The smaller component in the right iliacus
muscle has almost completely resolved. Fat containing ventral
hernia without complicating features.
IMPRESSION: 1.  Slight interval decrease in left atrial thrombus burden.
2.  CABG/median sternotomy.
3.  Pleural plaques.
4.  Interval decrease in size of right retroperitoneal fluid filled
structure, likely representing evolving/resolving retroperitoneal
hematoma.
5.  Omentum containing ventral hernia without complication.

CT PELVIS
FINDINGS: Pelvic small bowel loops appear within normal limits.
The colon appears within normal limits.  Normal appendix is
present.  No adenopathy or free fluid in the anatomic pelvis.  No
evidence of ischemic bowel in this patient with known left atrial
thrombus.
IMPRESSION: No significant pelvic abnormality.

## 2008-08-09 ENCOUNTER — Ambulatory Visit (HOSPITAL_COMMUNITY): Admission: RE | Admit: 2008-08-09 | Discharge: 2008-08-09 | Payer: Self-pay | Admitting: Cardiology

## 2008-08-28 IMAGING — US US EXTREM LOW VENOUS*L*
1 series · 14 of 24 positions shown · non-contrast
Comparison: Chest radiographs 09/29/2007.

CLINICAL DATA: Obstructed left subclavian vein.  Question DVT.
History of left atrial thrombus and pacemaker.

LEFT UPPER EXTREMITY VENOUS DUPLEX ULTRASOUND
TECHNIQUE: Gray-scale sonography with graded compression, as well
as color Doppler and duplex ultrasound were performed to evaluate
the leftupper extremity deep venous system from the level of the
subclavian vein and including the jugular, axillary, basilic and
upper cephalic vein.  Spectral Doppler was utilized to evaluate
flow at rest and with distal augmentation maneuvers.

[Series 1: us extrem low venous*left* · 14 of 29 slices shown]
[im 1/29]
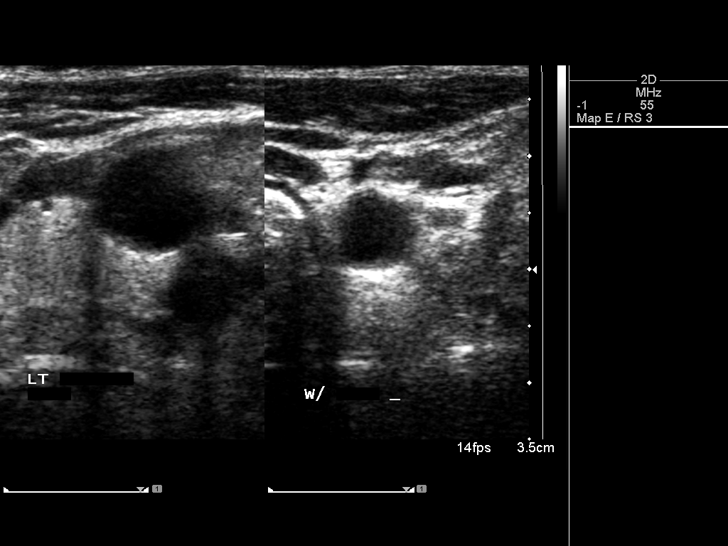
[im 3/29]
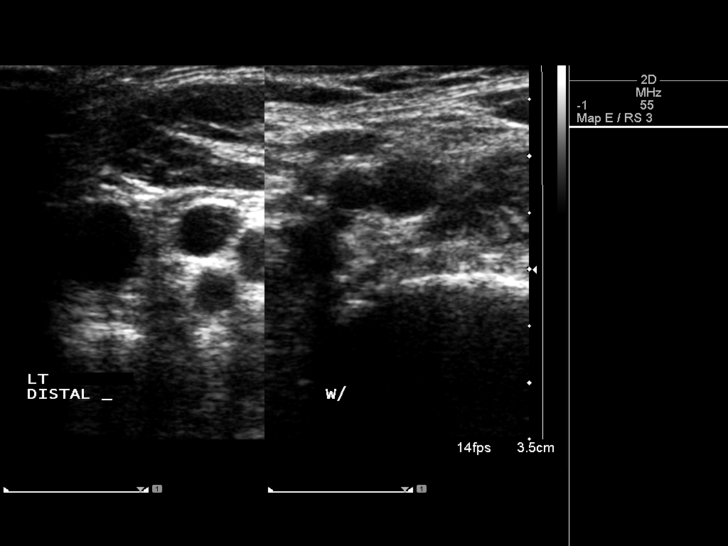
[im 5/29]
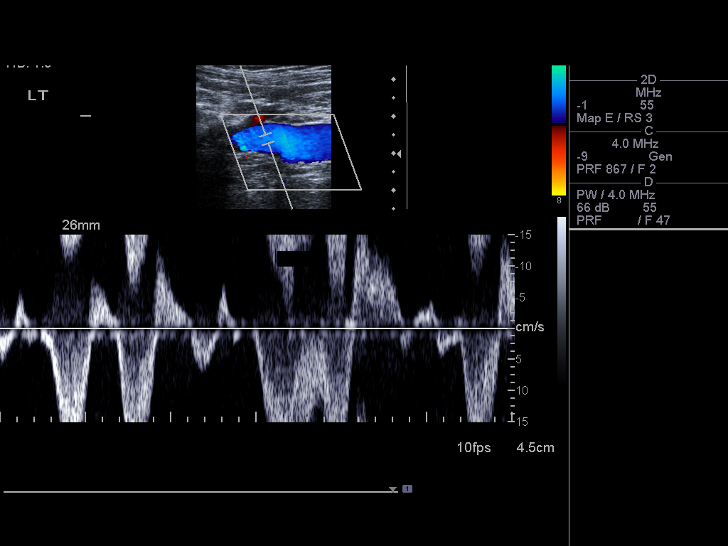
[im 8/29]
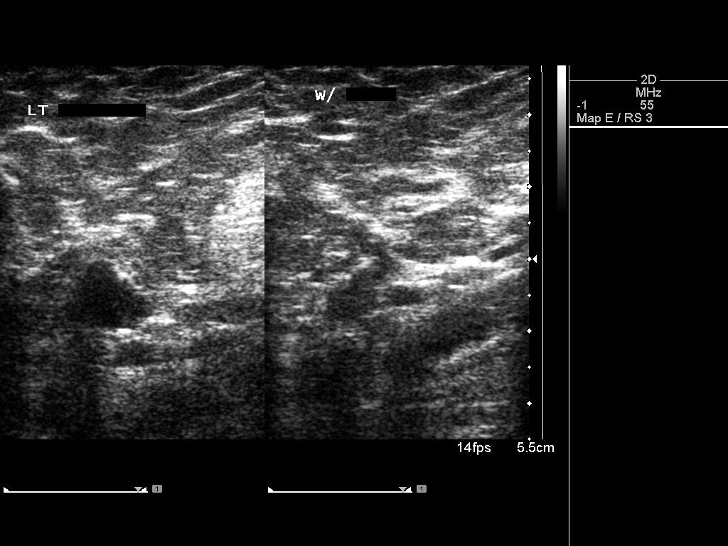
[im 9/29]
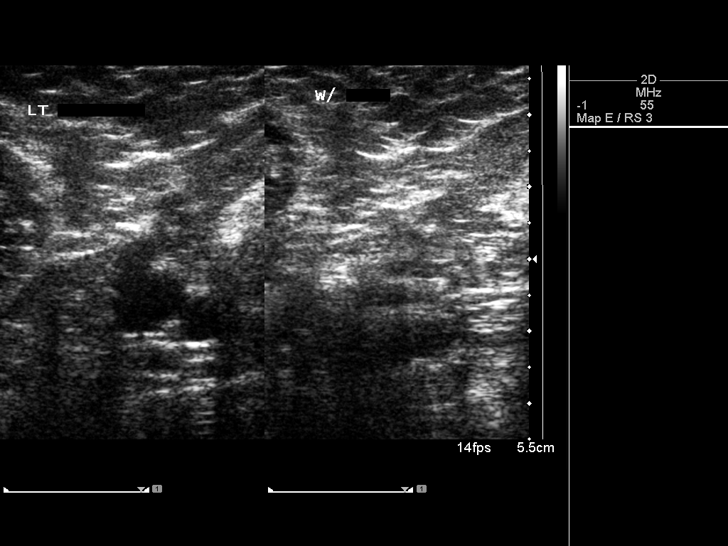
[im 11/29]
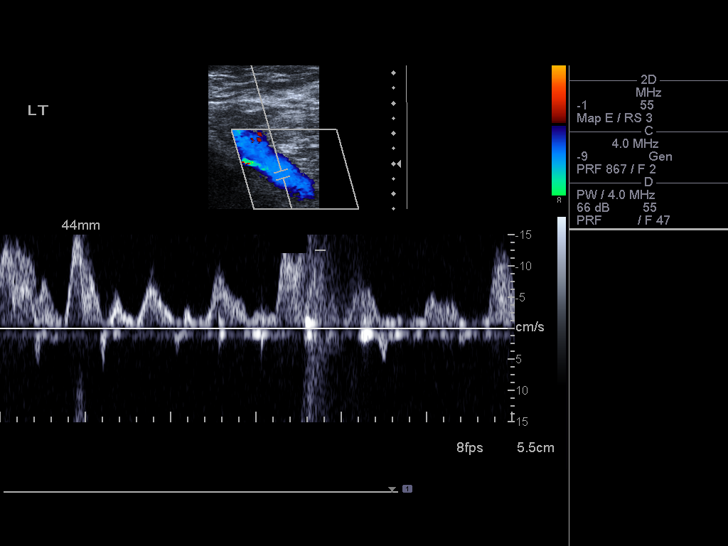
[im 14/29]
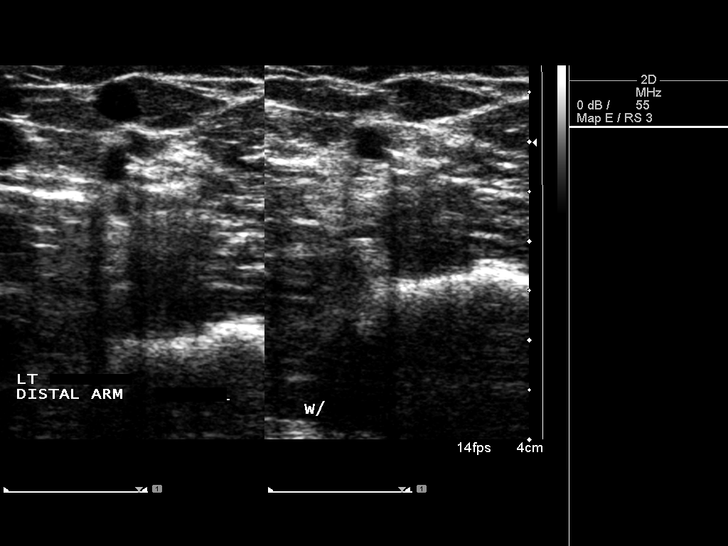
[im 15/29]
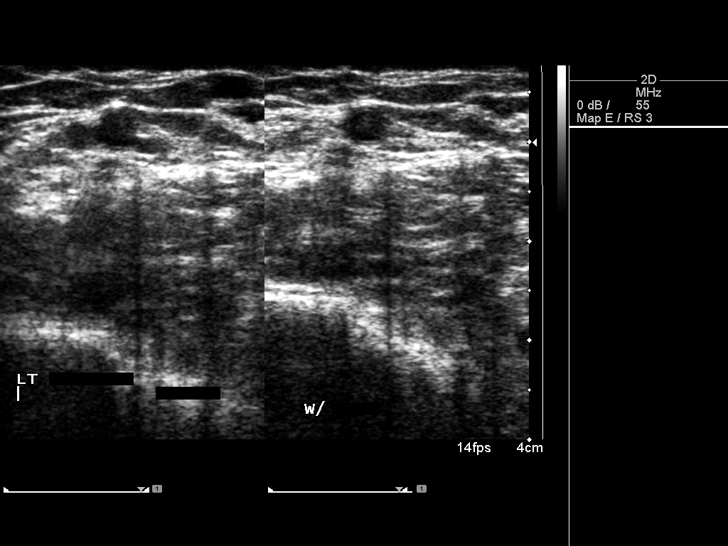
[im 18/29]
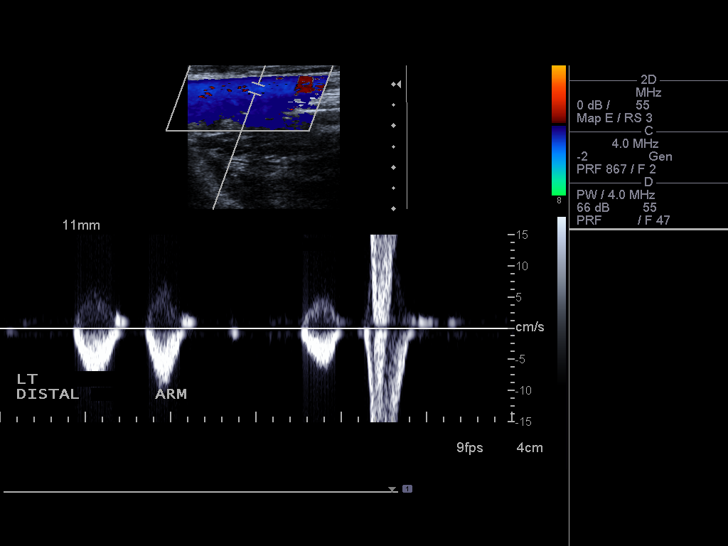
[im 20/29]
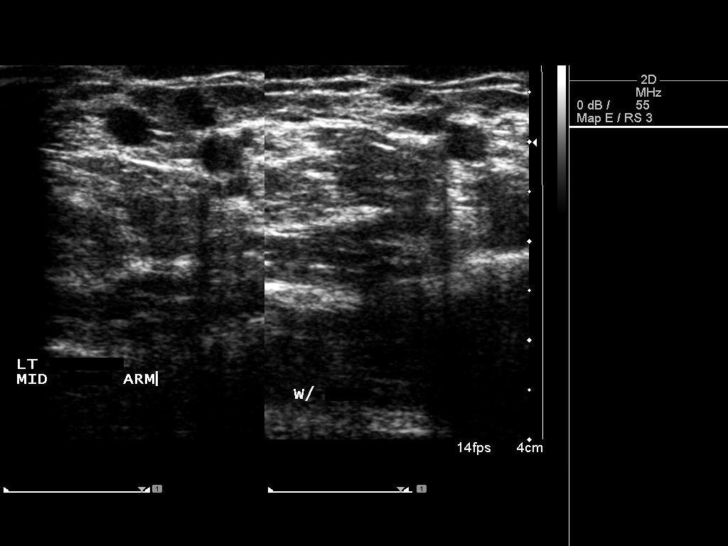
[im 22/29]
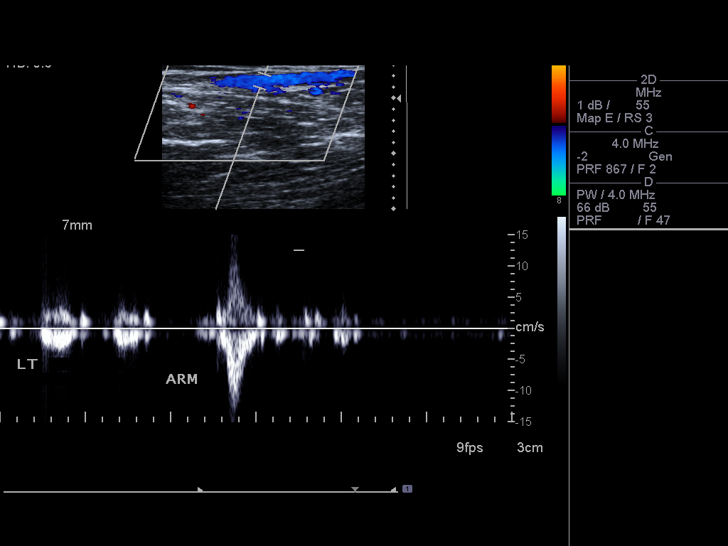
[im 24/29]
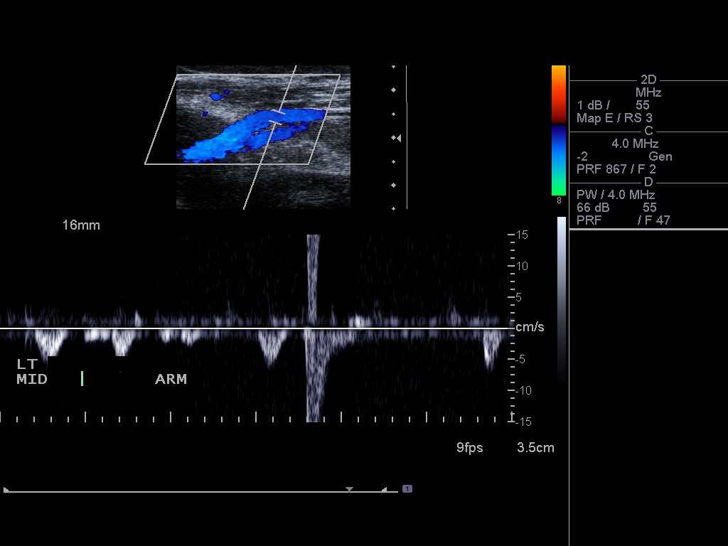
[im 26/29]
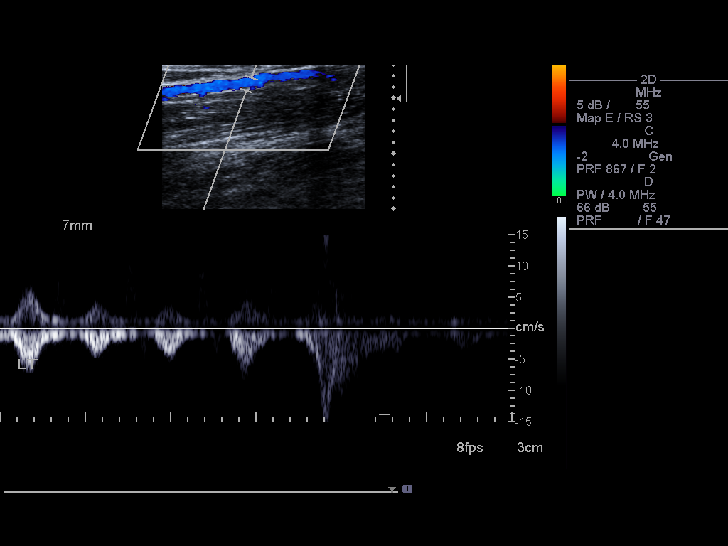
[im 29/29]
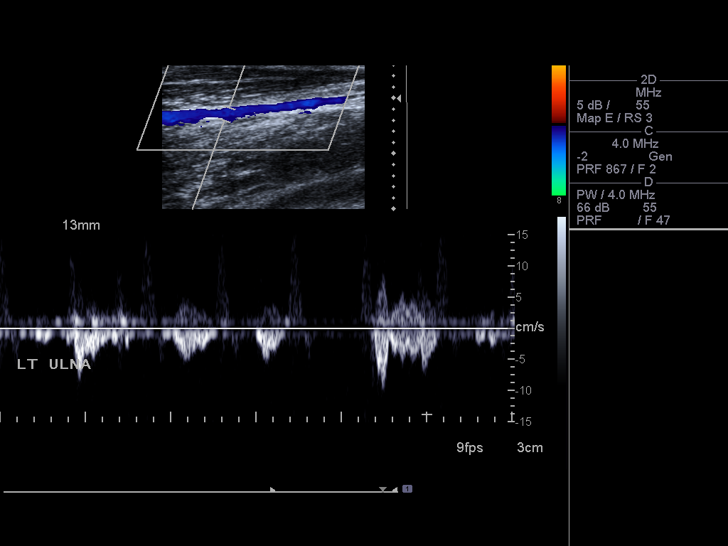

[14 of 24 positions shown; findings below may reference images not displayed]

FINDINGS: The left basilic and cephalic veins are compressible and
demonstrate normal blood flow with color Doppler.  The visualized
portions of the left internal jugular, left subclavian and left
axillary veins appear patent.  No DVT are demonstrated.  There is
normal augmentation and respiratory variation.
IMPRESSION: 1.  No evidence of acute left upper extremity DVT.
venous stenosis/obstruction.

## 2008-08-31 IMAGING — CR DG CHEST 2V
2 series · 2 of 2 positions shown · non-contrast
Comparison: 09/29/2007

CLINICAL DATA: End of life battery pacemaker.

CHEST - 2 VIEW

[w chest pa]
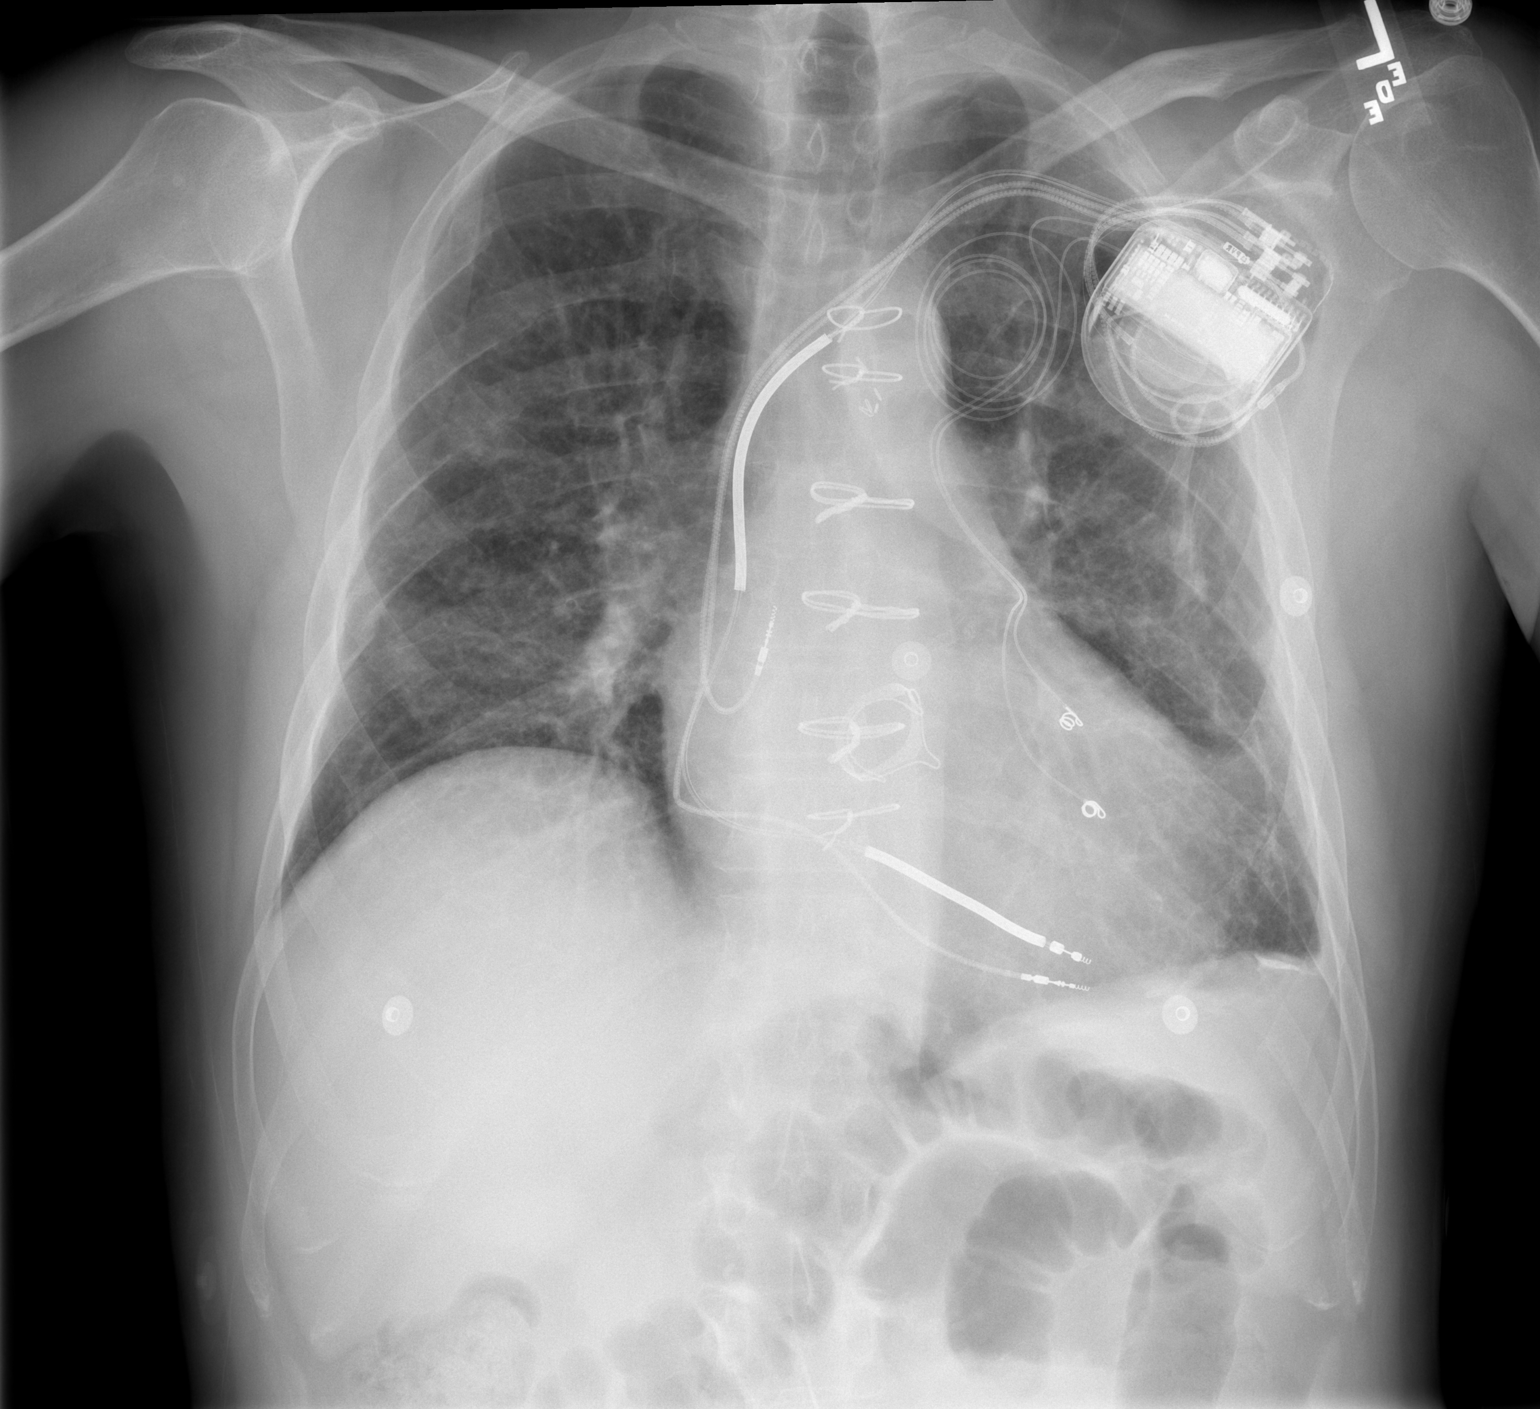

[w chest lat]
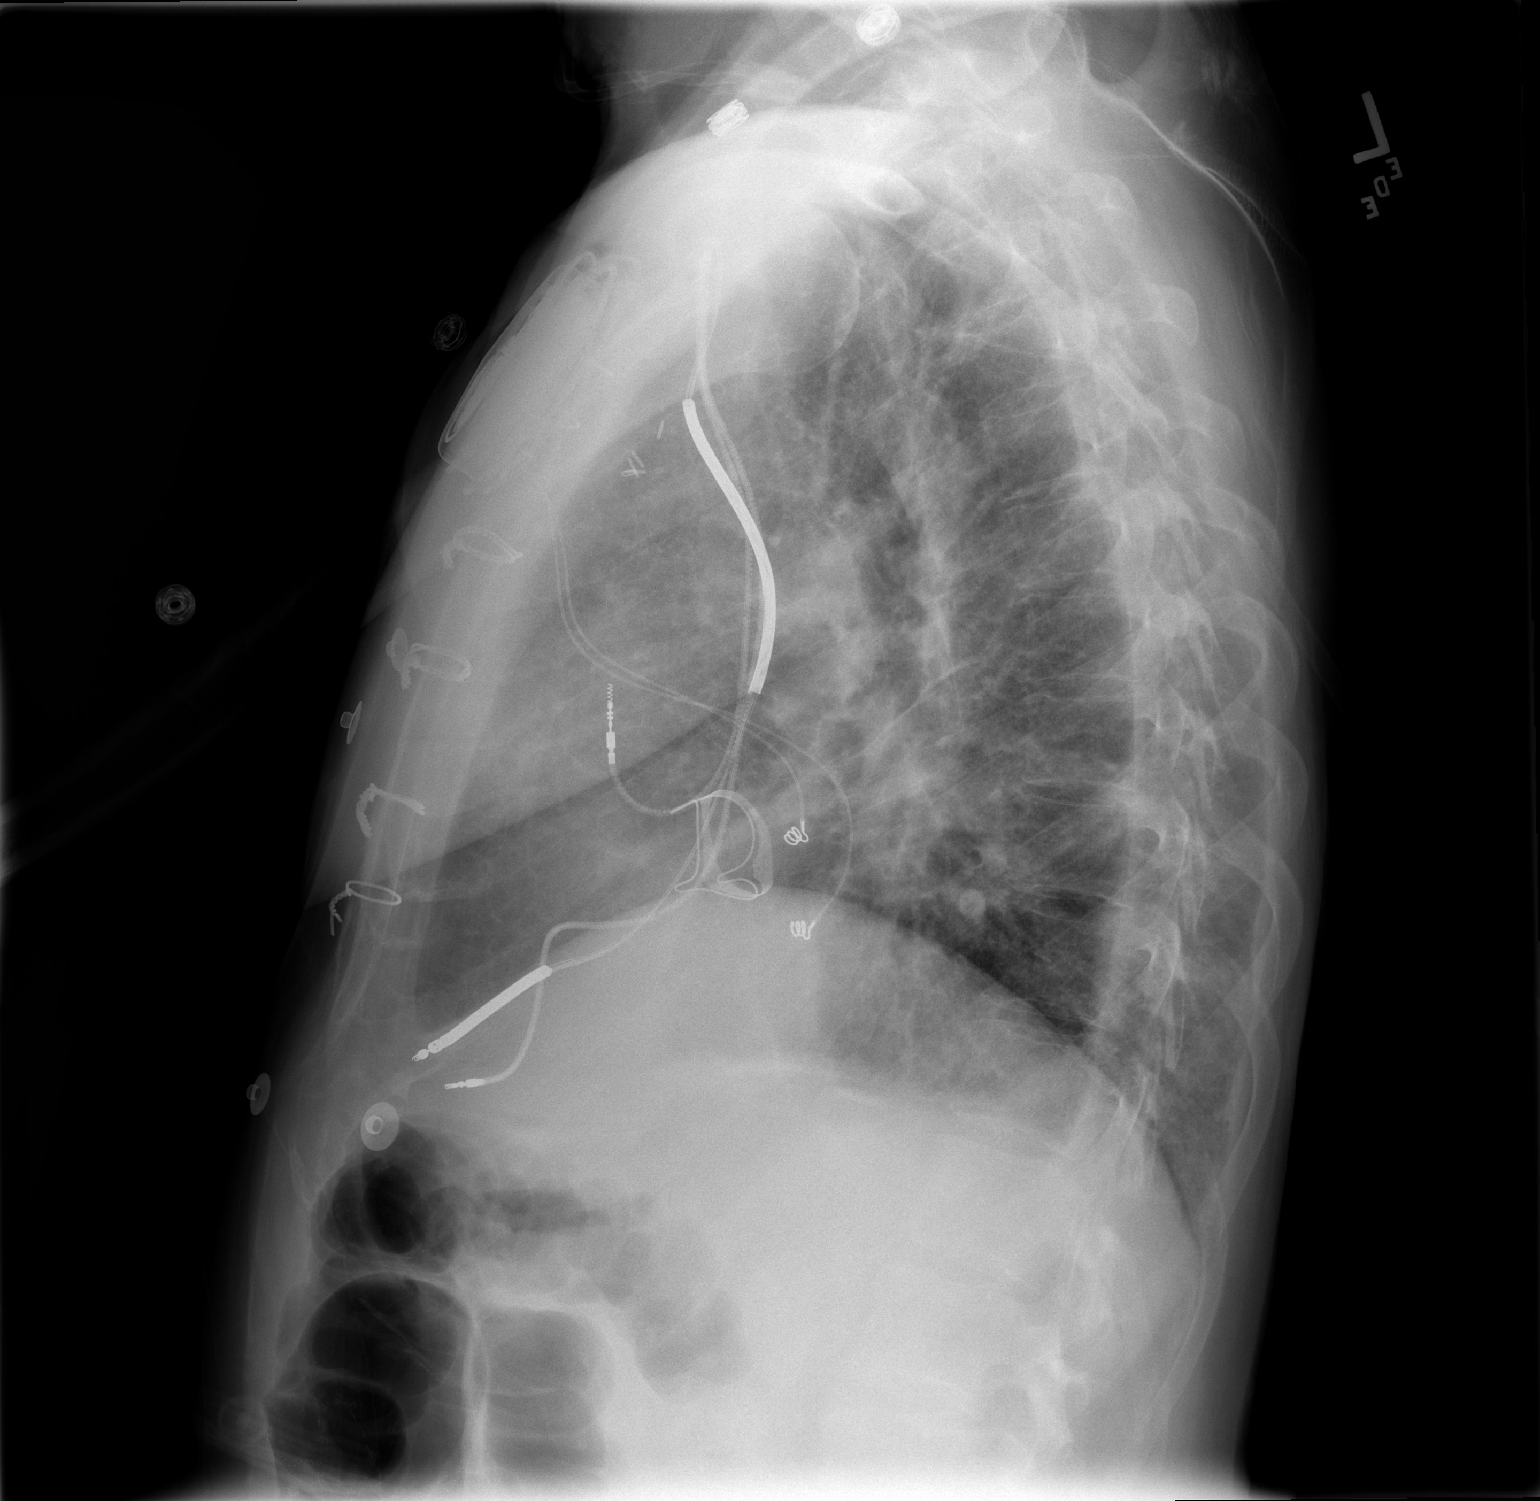

[2 of 2 positions shown; findings below may reference images not displayed]

FINDINGS: An AICD is in place since the prior study with the lead
extending into the right ventricle.  Existing right atrial and
right ventricle leads are unchanged.  There also epicardial leads
which are unchanged.

The heart is enlarged.  There is chronic lung disease with
increased lung markings bilaterally and  there is some chronic
pleural calcification in the left lung base.  Pulmonary scarring is
present bilaterally.  There is no edema or effusion. There is no
pneumothorax.  There has been prior mitral valve replacement.
IMPRESSION: Interval placement of a new battery pack and AICD lead.

Chronic lung disease and scarring.  No acute heart failure.

## 2008-09-08 IMAGING — CR DG CHEST 2V
2 series · 2 of 2 positions shown · non-contrast
Comparison: 11/24/2007.

CLINICAL DATA: Congestive heart failure.  Swelling around pacemaker
site.

CHEST - 2 VIEW

[w chest pa]
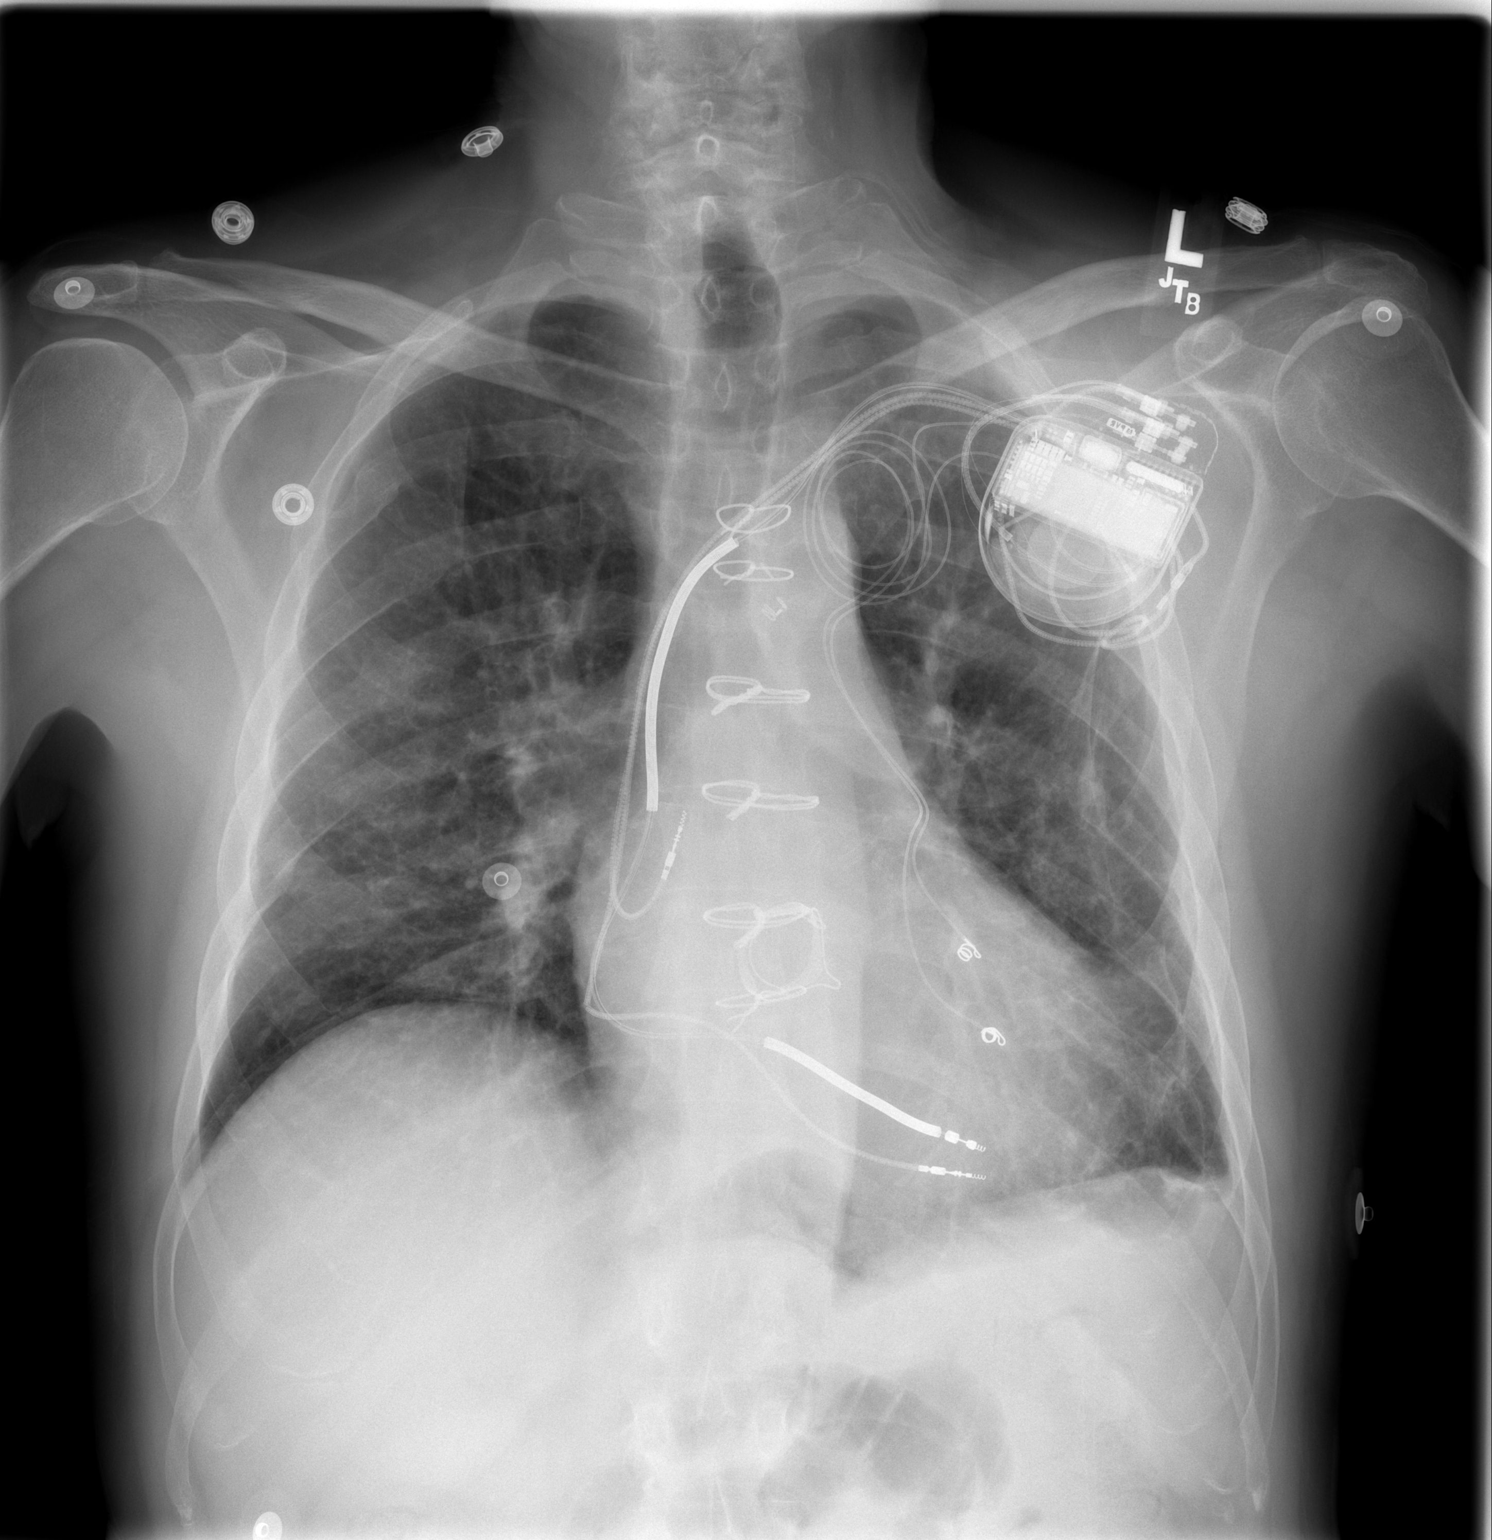

[w chest lat]
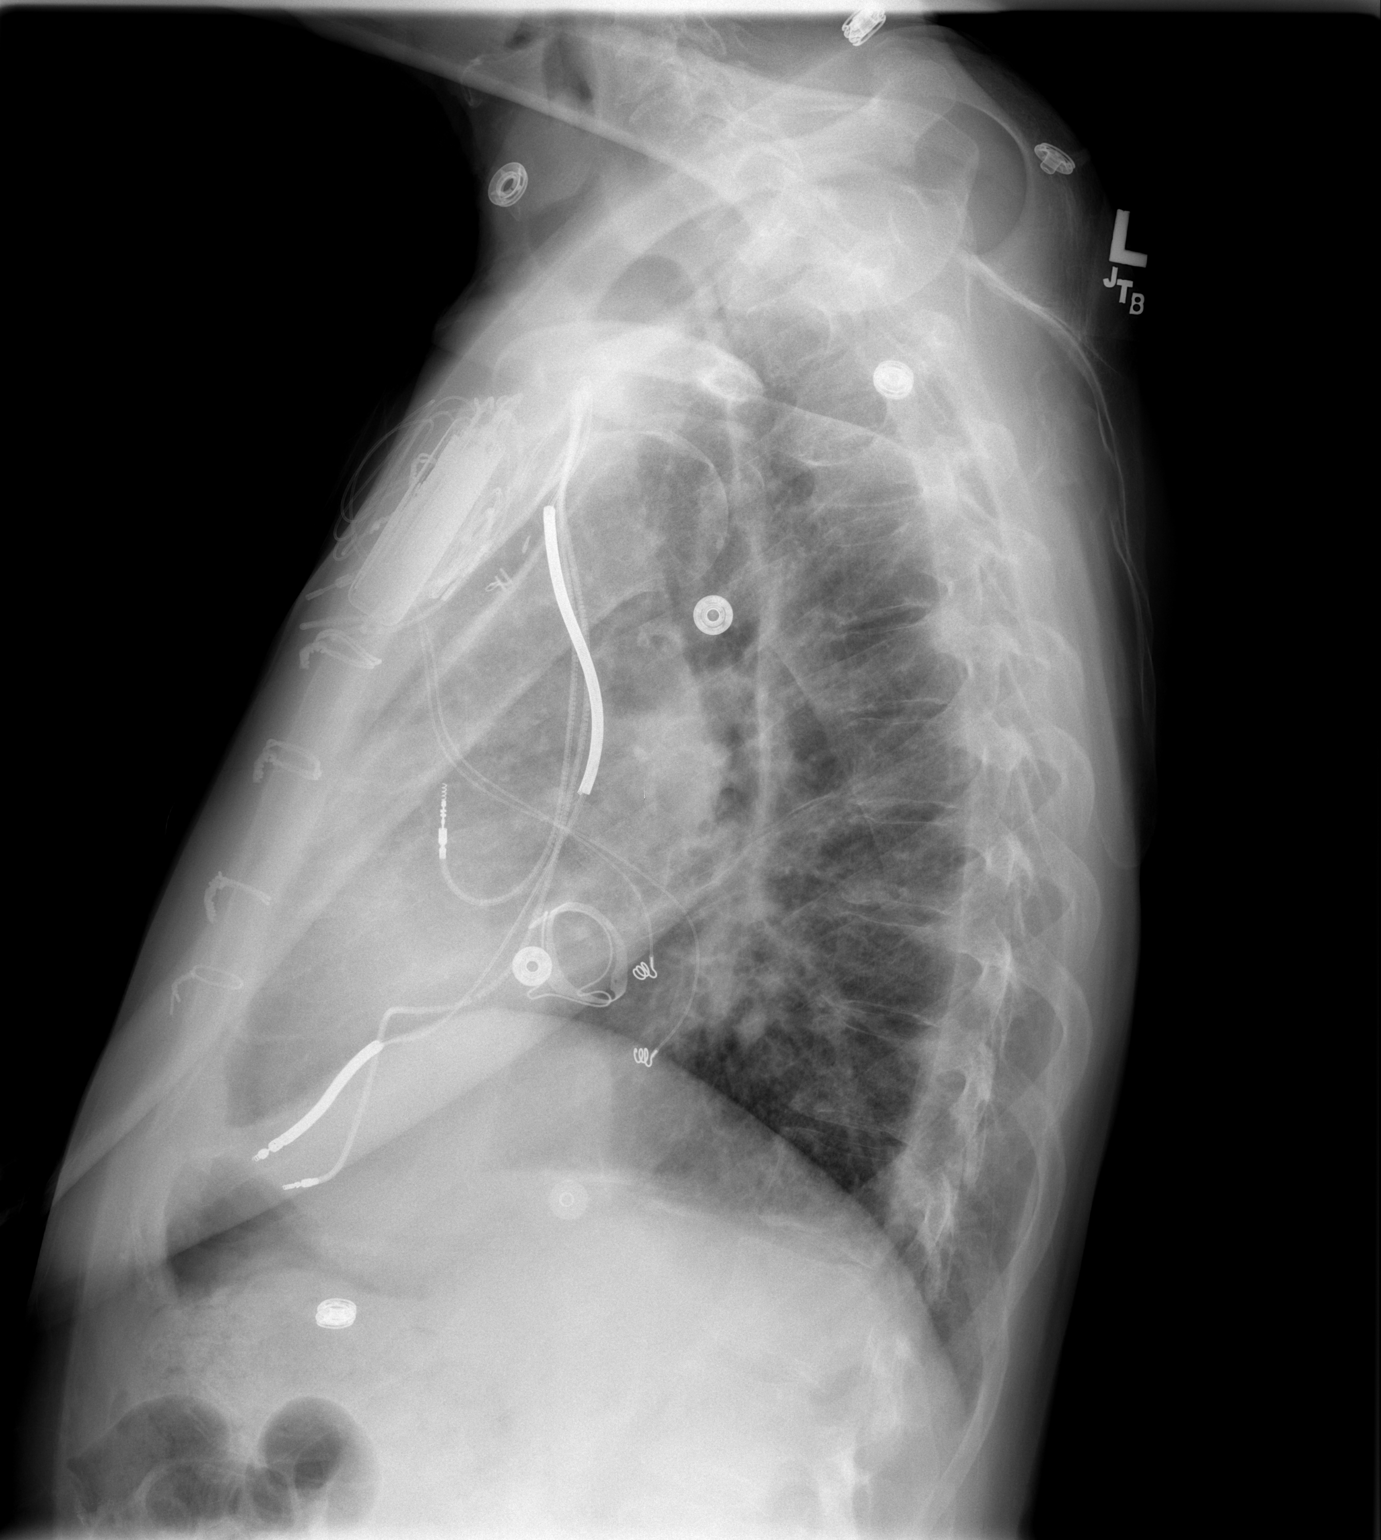

[2 of 2 positions shown; findings below may reference images not displayed]

FINDINGS: The left subclavian AICD generator and leads appear
stable in position.  There is stable mild cardiac enlargement
status post median sternotomy and mitral valve replacement.

Chronic lung disease appears stable with linear scarring or
atelectasis bilaterally.  There is vascular congestion with chronic
blunting of the left costophrenic angle.  No pneumothorax or
significant pleural effusion is seen.  There are old rib fractures
on the right.
IMPRESSION: Stable chest with findings as above.  No acute process.

## 2008-11-30 IMAGING — CR DG CHEST 2V
2 series · 2 of 2 positions shown · non-contrast
Comparison: 12/02/2007

CLINICAL DATA: Shortness of breath, hypertension

CHEST - 2 VIEW

[w chest pa]
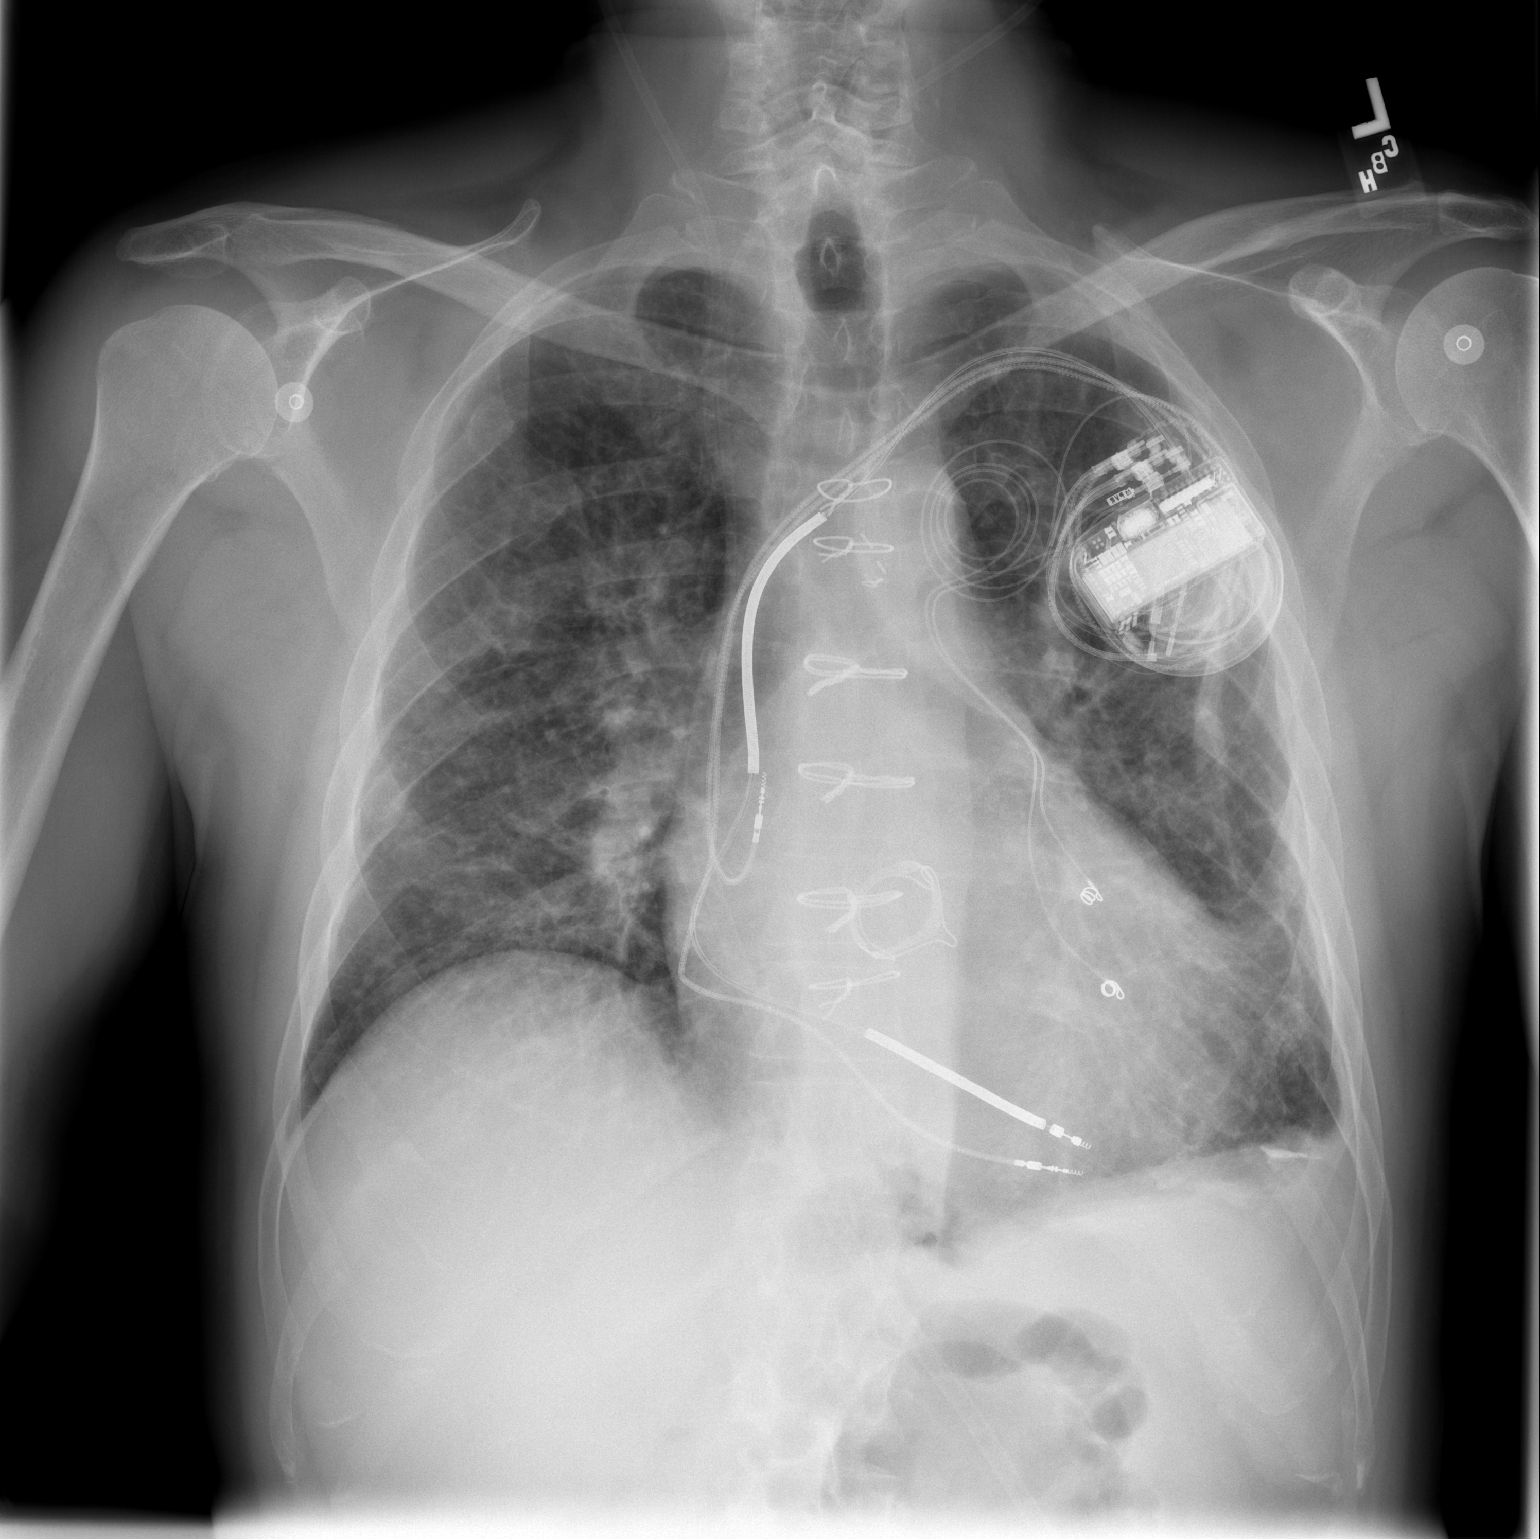

[w chest lat]
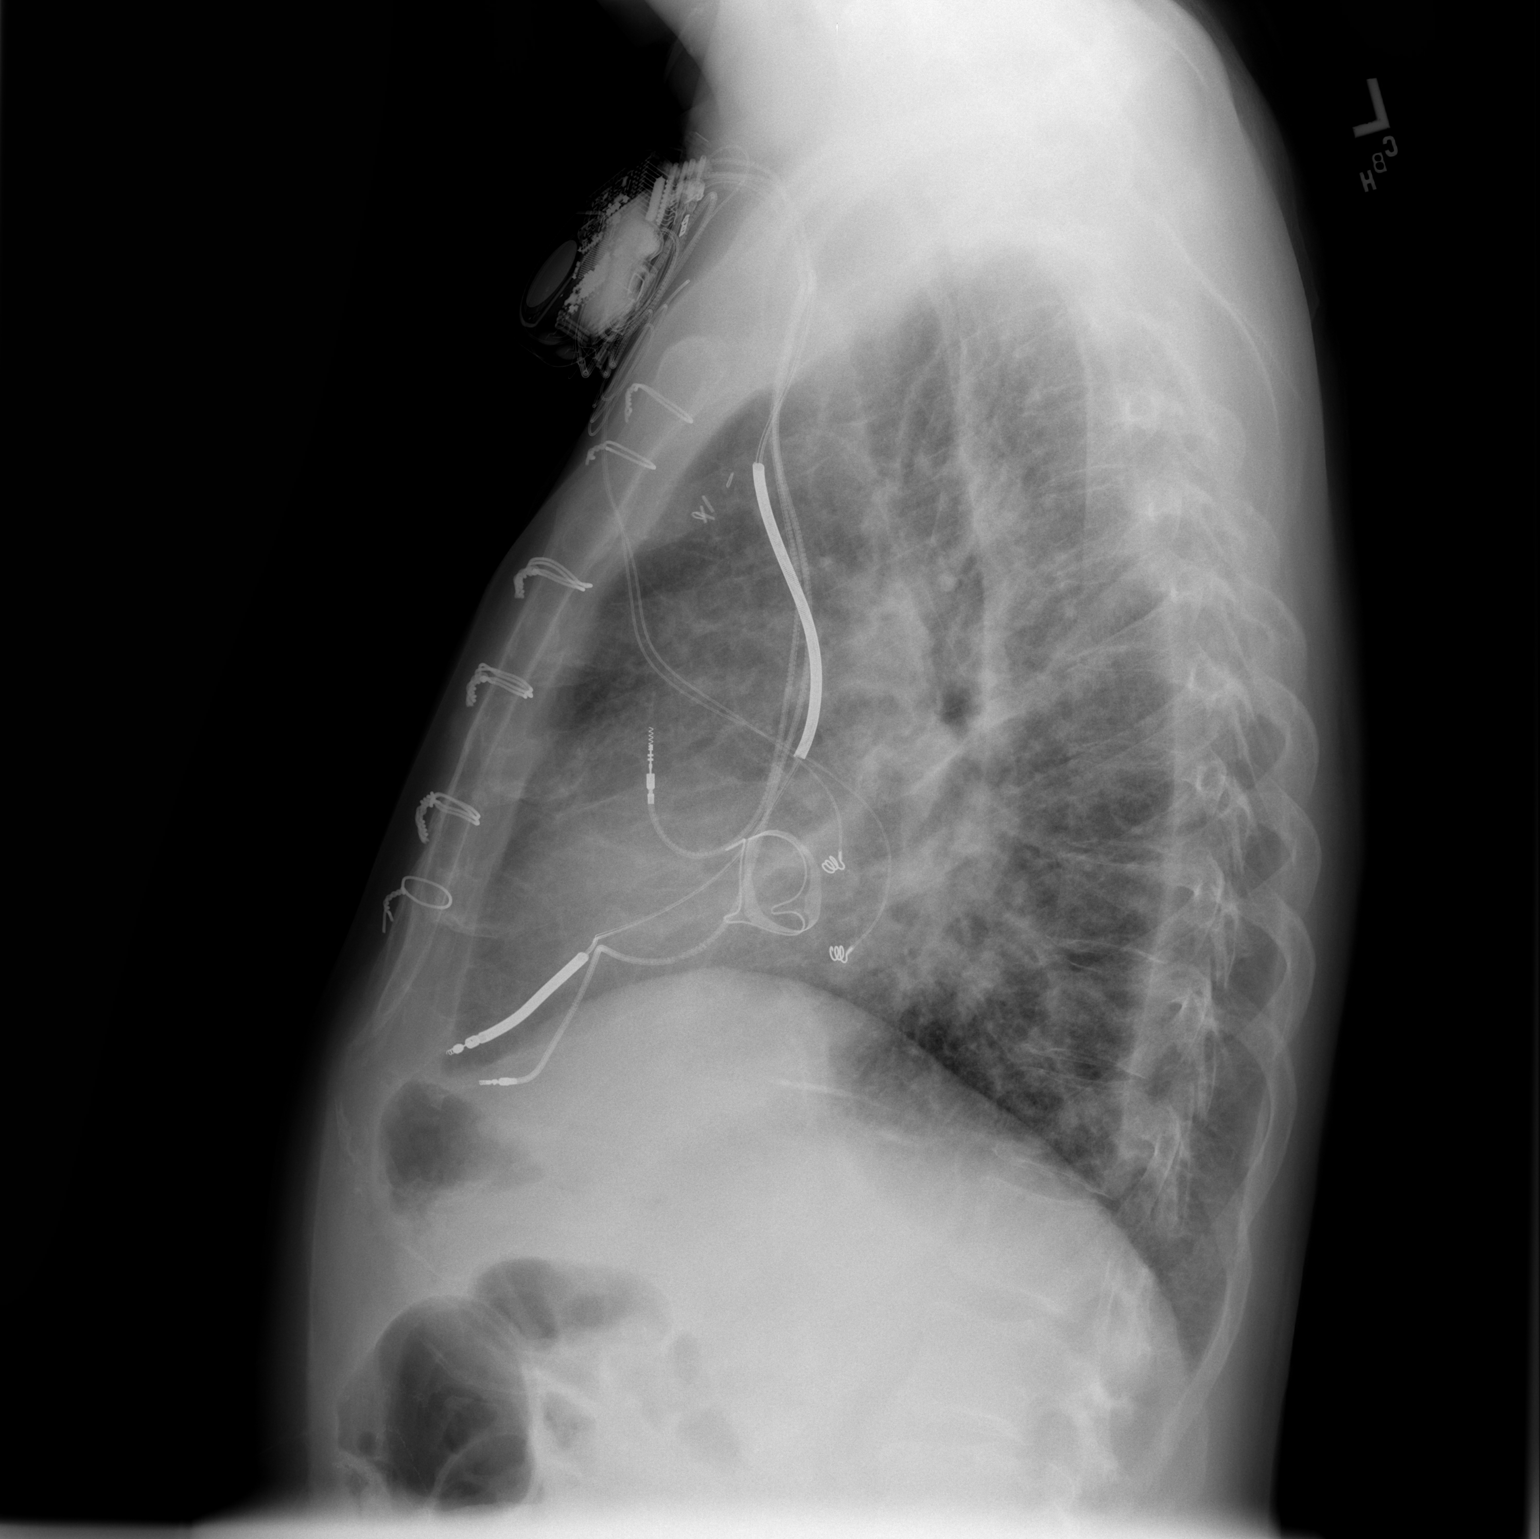

[2 of 2 positions shown; findings below may reference images not displayed]

FINDINGS: Left dual lead AICD in place.  Evidence of prior mitral
valvuloplasty.  Heart size upper limits of normal.  Chronically
increased interstitial lung markings again noted without new focal
pulmonary opacity.  No pleural effusion.  Chronic left costophrenic
angle blunting noted with plaque-like calcification over the left
hemidiaphragm.  Remote right rib fractures noted.
IMPRESSION: No acute cardiopulmonary process. Diffusely increased interstitial
lung markings noted, without focal pulmonary opacity.

## 2008-12-02 IMAGING — CT CT HEAD W/O CM
1 of 2 series · 13 of 30 positions shown, 17 images · non-contrast
Comparison: 02/23/2008

CLINICAL DATA: CVA, headache.

CT HEAD WITHOUT CONTRAST
TECHNIQUE: Contiguous axial images were obtained from the base of
the skull through the vertex without contrast.

[Series 2: brain · axial · 0.49mm/px · z∈[+101,+221]mm · 13 of 28 slices shown, 17 images]
[im 2/28  brain]
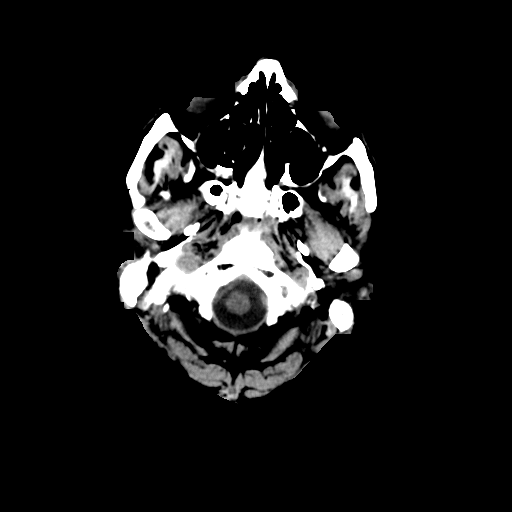
[im 2/28  bone]
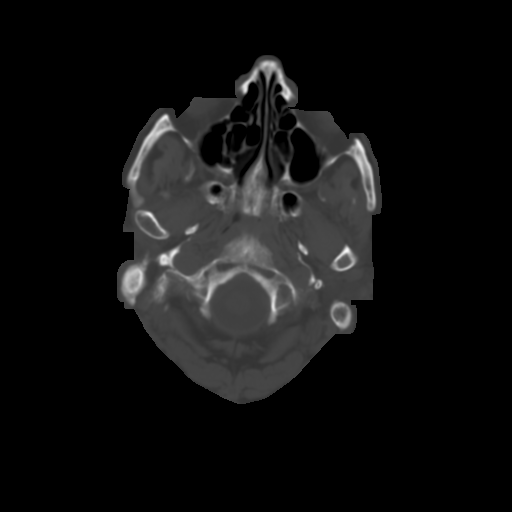
[im 4/28  brain]
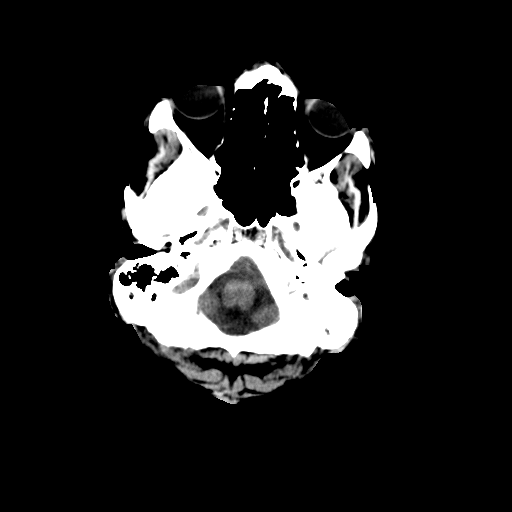
[im 6/28  brain]
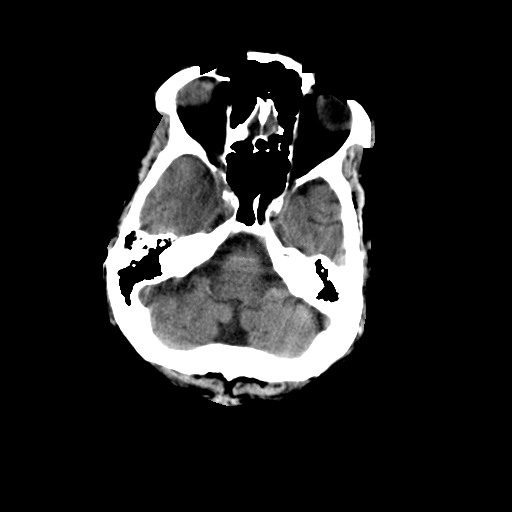
[im 8/28  brain]
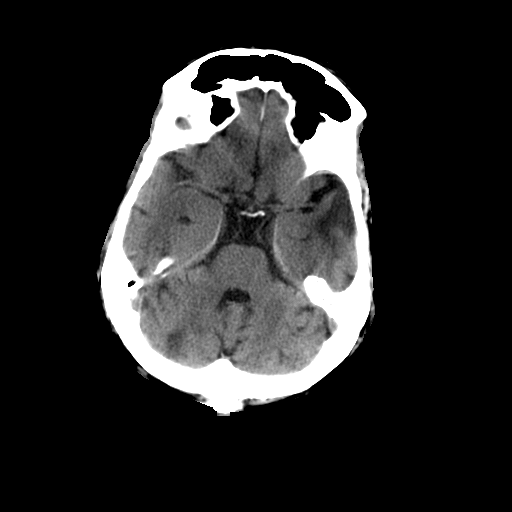
[im 10/28  brain]
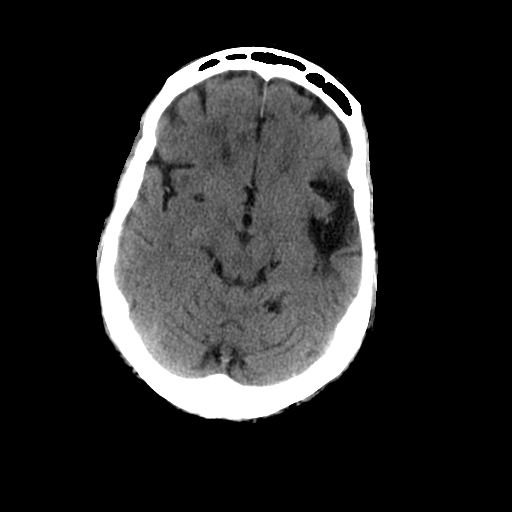
[im 10/28  bone]
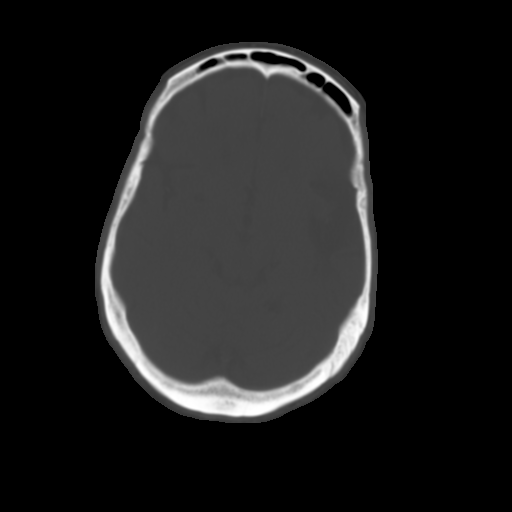
[im 12/28  brain]
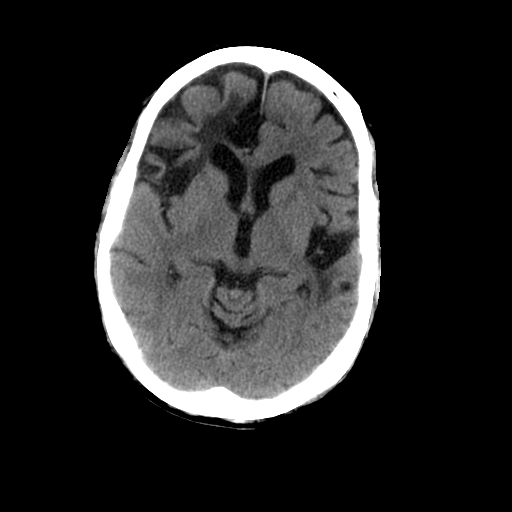
[im 14/28  brain]
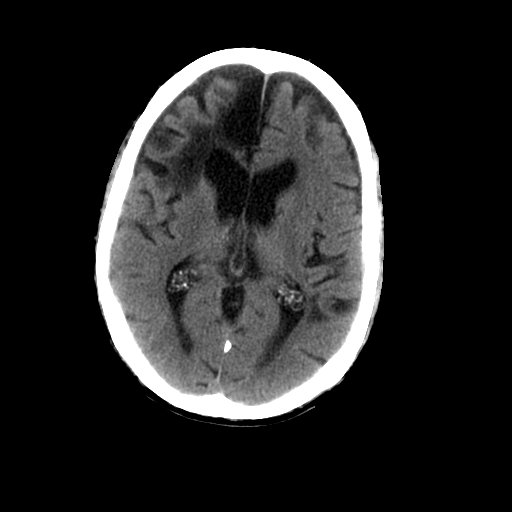
[im 16/28  brain]
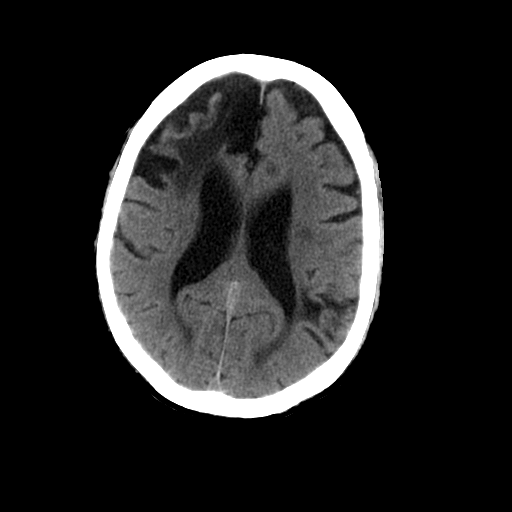
[im 18/28  brain]
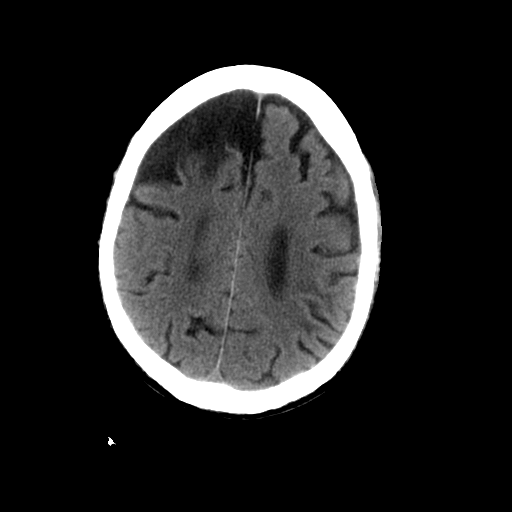
[im 18/28  bone]
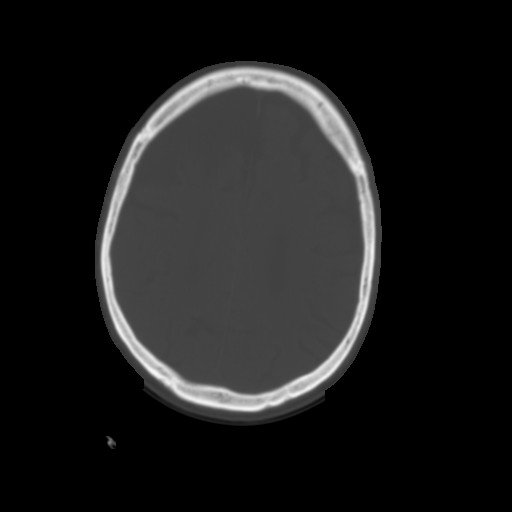
[im 20/28  brain]
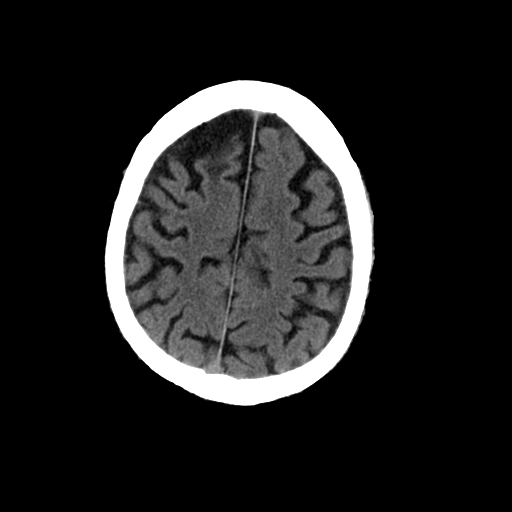
[im 22/28  brain]
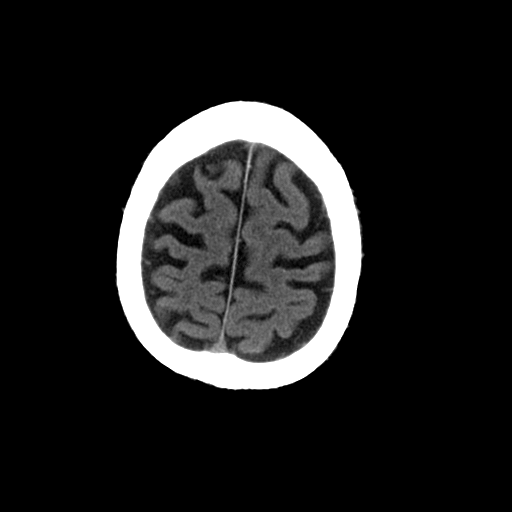
[im 24/28  brain]
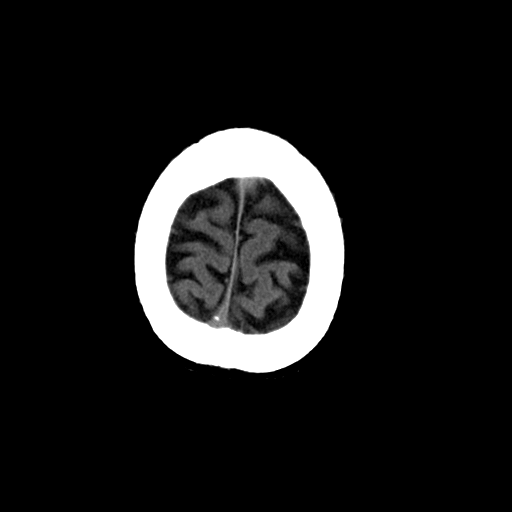
[im 26/28  brain]
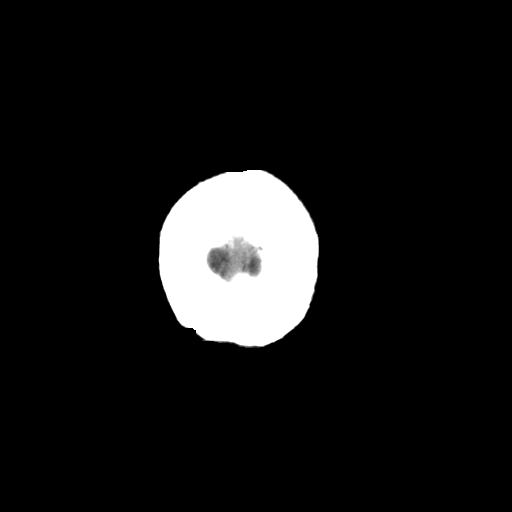
[im 26/28  bone]
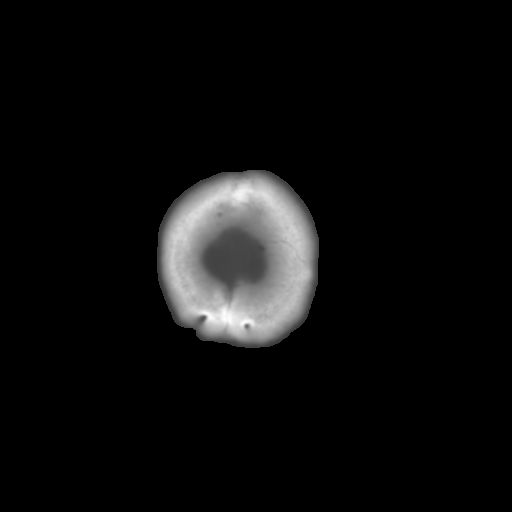

[13 of 30 positions shown; findings below may reference images not displayed]

FINDINGS: Areas of old infarct again noted in the right frontal
lobe, left temporal lobe, and left parietal lobe, unchanged.  Old
lacunar infarct in the right basal ganglia.  There is mild atrophy.
No acute intracranial abnormality.  Specifically, no hemorrhage,
hydrocephalus, mass lesion, acute infarction, or significant
intracranial injury.  No acute calvarial abnormality.
IMPRESSION: No acute intracranial abnormailty.

Chronic changes, stable.

## 2008-12-14 ENCOUNTER — Inpatient Hospital Stay (HOSPITAL_COMMUNITY): Admission: EM | Admit: 2008-12-14 | Discharge: 2008-12-17 | Payer: Self-pay | Admitting: Emergency Medicine

## 2008-12-23 IMAGING — CT CT HEAD W/O CM
1 of 2 series · 16 of 30 positions shown, 20 images · non-contrast
Comparison: 02/25/2008

CLINICAL DATA: Lethargic.  Prior strokes.  Fever..

CT HEAD WITHOUT CONTRAST
TECHNIQUE: Contiguous axial images were obtained from the base of
the skull through the vertex without contrast.

[Series 2: head routine 4.8 h37s · axial · 0.43mm/px · z∈[-168,-42]mm · 16 of 30 slices shown, 20 images]
[im 2/30  brain]
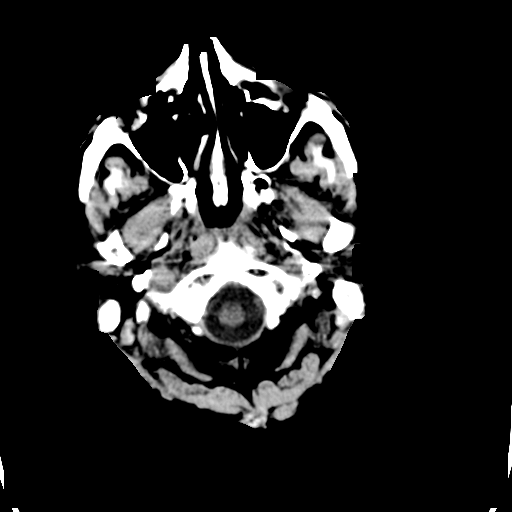
[im 2/30  bone]
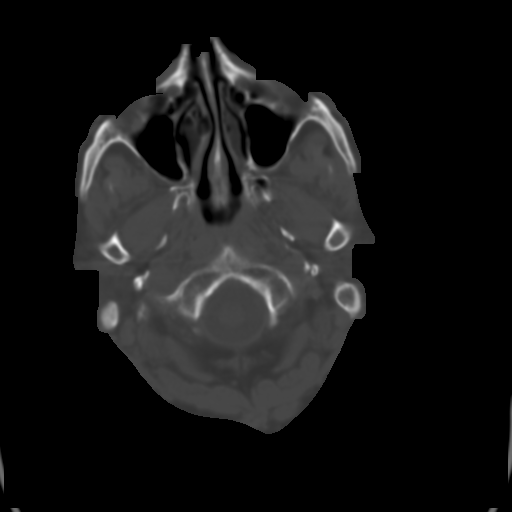
[im 4/30  brain]
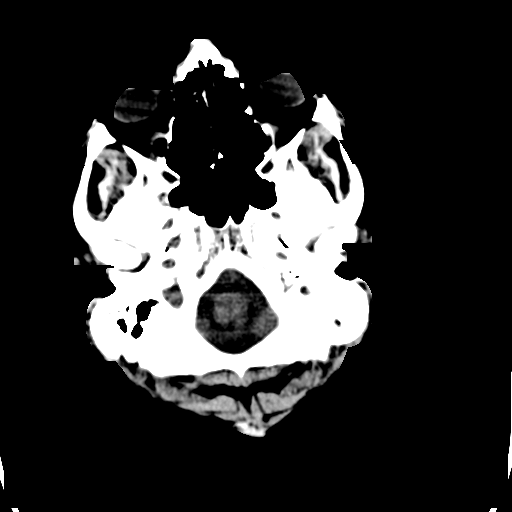
[im 5/30  brain]
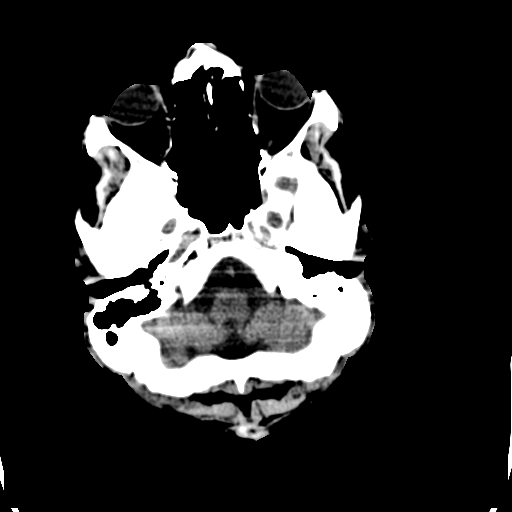
[im 8/30  brain]
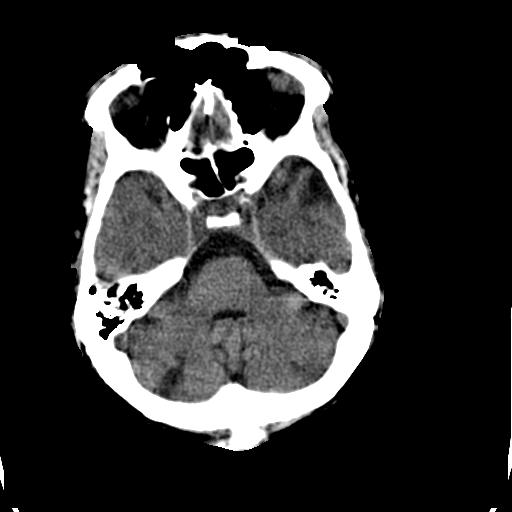
[im 9/30  brain]
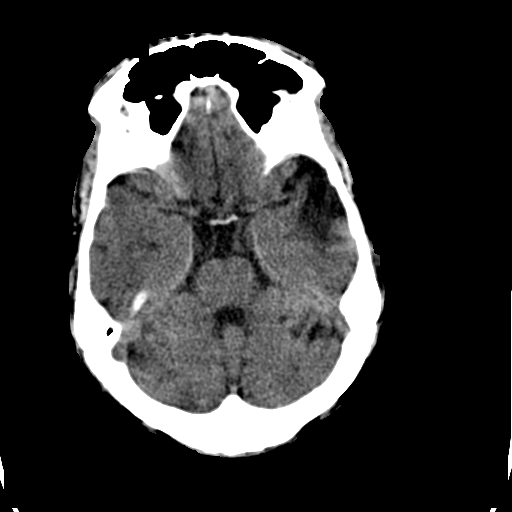
[im 9/30  bone]
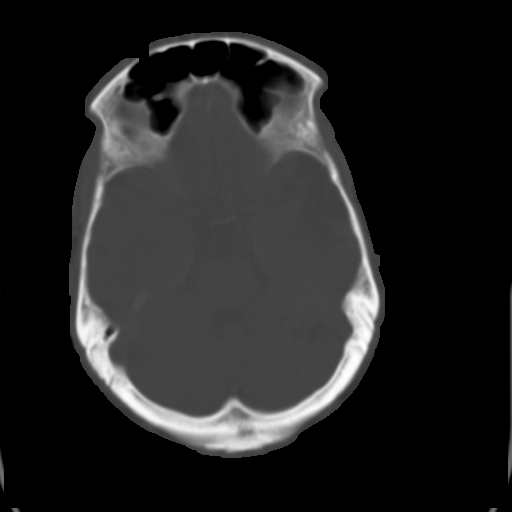
[im 10/30  brain]
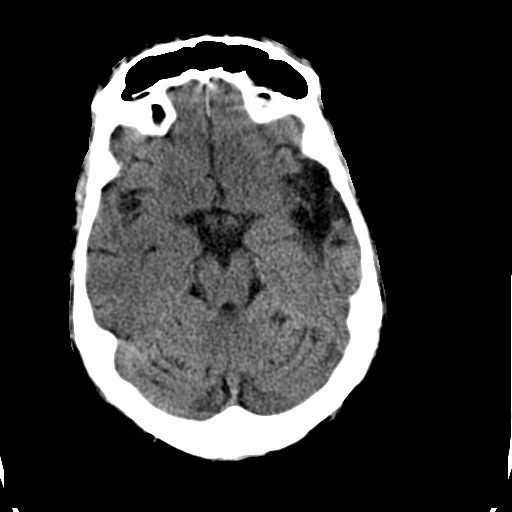
[im 13/30  brain]
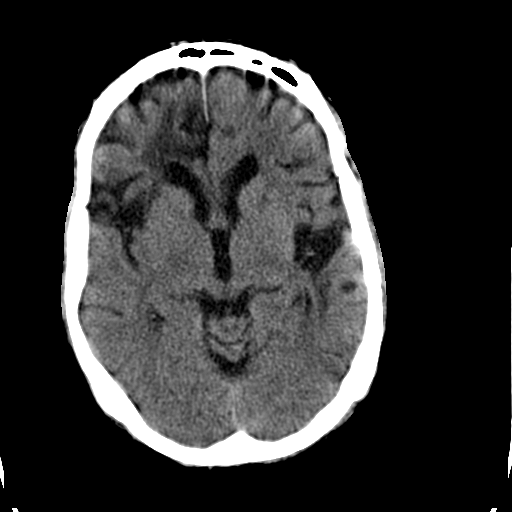
[im 14/30  brain]
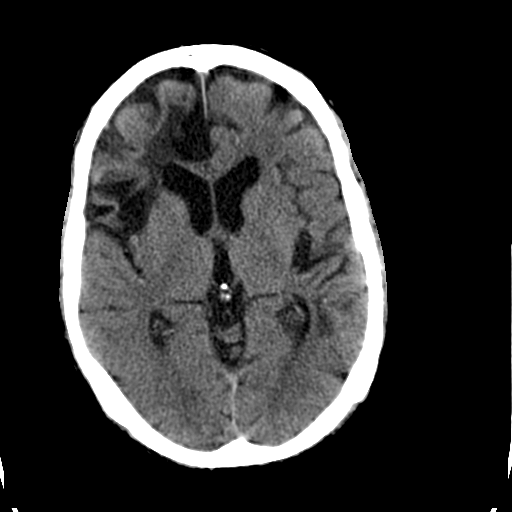
[im 16/30  brain]
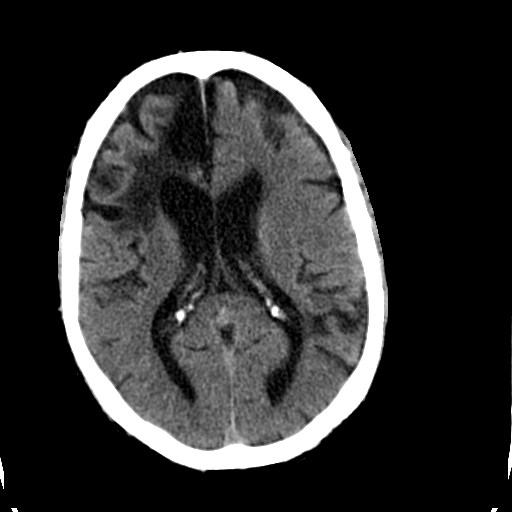
[im 16/30  bone]
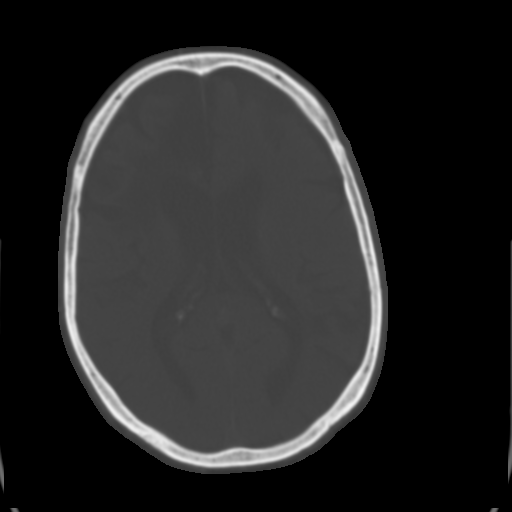
[im 17/30  brain]
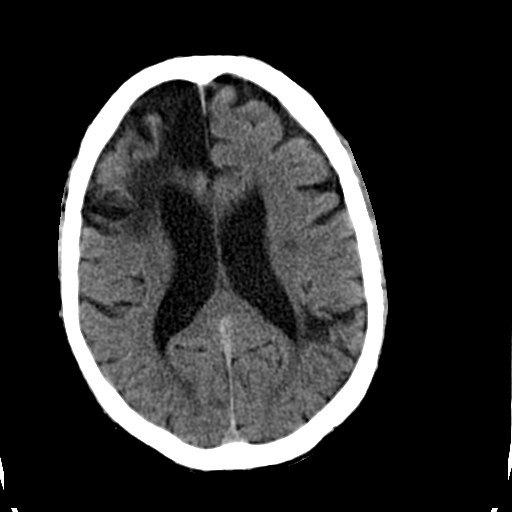
[im 20/30  brain]
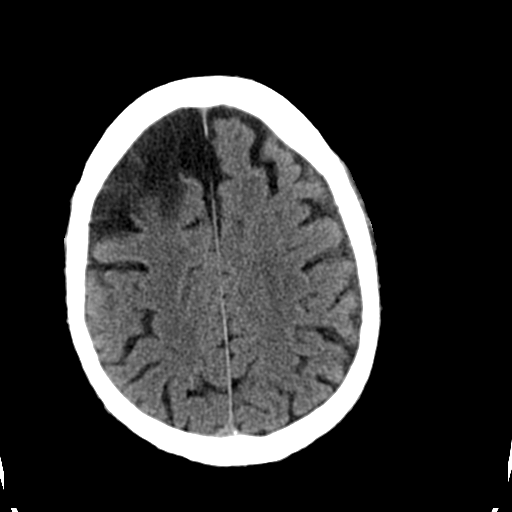
[im 21/30  brain]
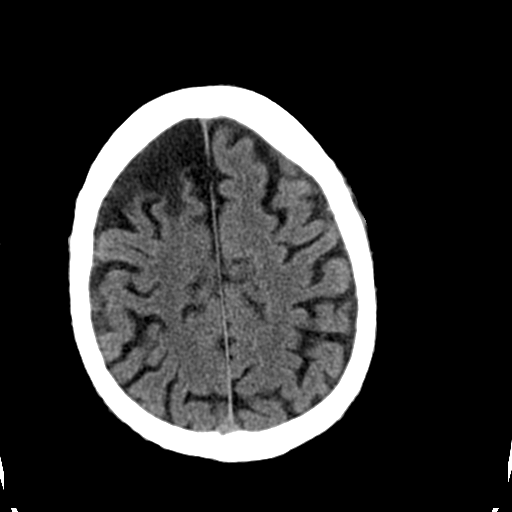
[im 22/30  brain]
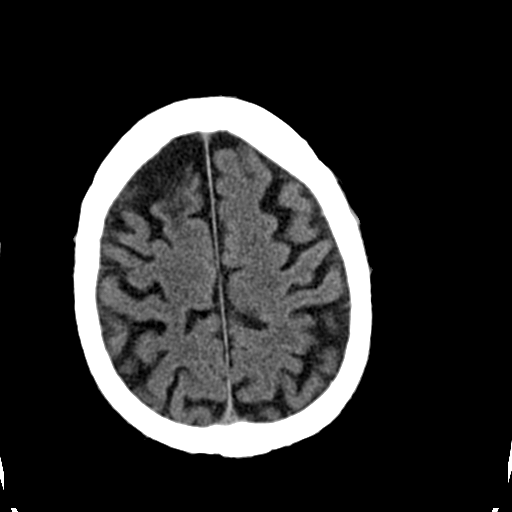
[im 22/30  bone]
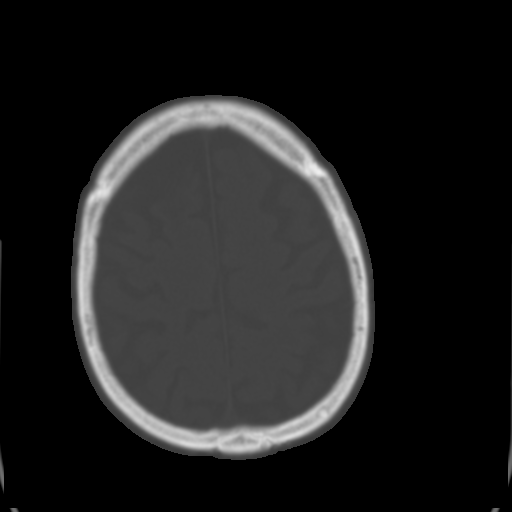
[im 25/30  brain]
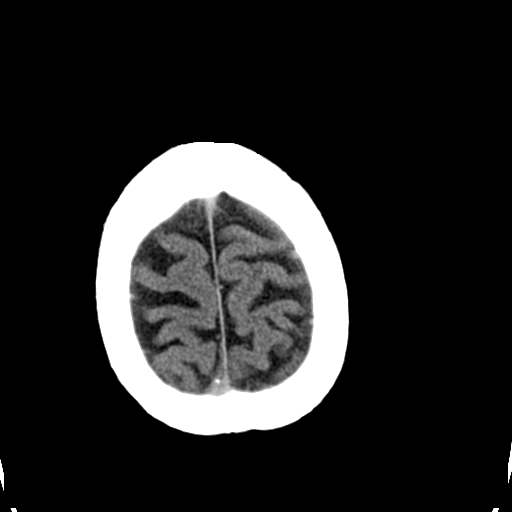
[im 26/30  brain]
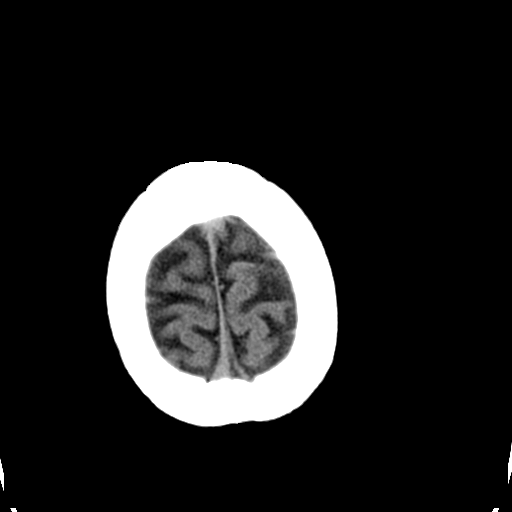
[im 28/30  brain]
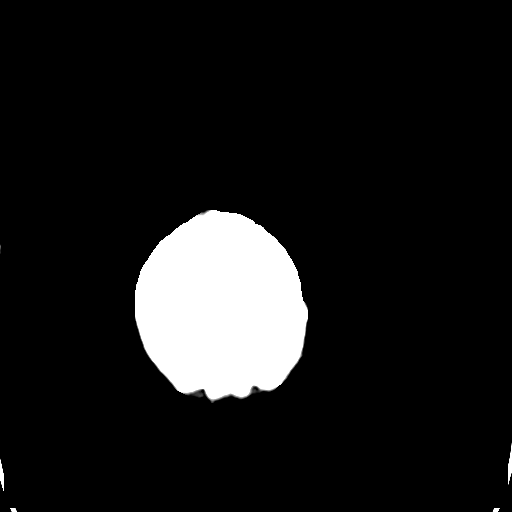

[16 of 30 positions shown; findings below may reference images not displayed]

FINDINGS: There is no evidence of intracranial hemorrhage, brain
edema, or other signs of acute infarction.  There is no evidence of
intracranial mass lesions or mass effect.  No abnormal extra-axial
fluid collections are identified.

Multiple old infarcts are seen involving the right frontal, left
temporal, and left parietal lobes, as well as both cerebellar
hemispheres.  Mild cerebral atrophy is noted as well as chronic
microvascular matter disease.
IMPRESSION: 1.  No acute intracranial findings.
2.  Multiple old bilateral cerebral and cerebellar infarcts.
3.  Cerebral atrophy and chronic microvascular matter disease.
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
## 2009-01-10 ENCOUNTER — Inpatient Hospital Stay (HOSPITAL_COMMUNITY): Admission: EM | Admit: 2009-01-10 | Discharge: 2009-01-16 | Payer: Self-pay | Admitting: Emergency Medicine

## 2009-01-27 ENCOUNTER — Emergency Department (HOSPITAL_COMMUNITY): Admission: EM | Admit: 2009-01-27 | Discharge: 2009-01-27 | Payer: Self-pay | Admitting: Family Medicine
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
## 2010-02-07 ENCOUNTER — Other Ambulatory Visit: Payer: Self-pay | Admitting: Emergency Medicine

## 2010-02-08 ENCOUNTER — Ambulatory Visit: Payer: Self-pay | Admitting: Psychiatry

## 2010-02-08 ENCOUNTER — Other Ambulatory Visit: Payer: Self-pay | Admitting: Emergency Medicine

## 2010-02-08 ENCOUNTER — Inpatient Hospital Stay (HOSPITAL_COMMUNITY): Admission: AD | Admit: 2010-02-08 | Discharge: 2010-02-17 | Payer: Self-pay | Admitting: Psychiatry

## 2010-03-18 ENCOUNTER — Emergency Department (HOSPITAL_COMMUNITY): Admission: EM | Admit: 2010-03-18 | Discharge: 2010-03-19 | Payer: Self-pay | Admitting: Emergency Medicine

## 2010-04-07 ENCOUNTER — Ambulatory Visit: Payer: Self-pay | Admitting: Internal Medicine

## 2010-04-08 ENCOUNTER — Ambulatory Visit: Payer: Self-pay | Admitting: Internal Medicine

## 2010-04-08 ENCOUNTER — Inpatient Hospital Stay (HOSPITAL_COMMUNITY): Admission: EM | Admit: 2010-04-08 | Discharge: 2010-04-15 | Payer: Self-pay | Admitting: Emergency Medicine

## 2010-06-28 ENCOUNTER — Encounter: Payer: Self-pay | Admitting: Cardiology

## 2010-07-10 ENCOUNTER — Emergency Department (HOSPITAL_COMMUNITY)
Admission: EM | Admit: 2010-07-10 | Discharge: 2010-07-10 | Disposition: A | Discharge status: Home / Self Care | Payer: Medicare Other | Attending: Emergency Medicine | Admitting: Emergency Medicine

## 2010-07-10 DIAGNOSIS — Z8679 Personal history of other diseases of the circulatory system: Secondary | ICD-10-CM | POA: Insufficient documentation

## 2010-07-10 DIAGNOSIS — J449 Chronic obstructive pulmonary disease, unspecified: Secondary | ICD-10-CM | POA: Insufficient documentation

## 2010-07-10 DIAGNOSIS — Z760 Encounter for issue of repeat prescription: Secondary | ICD-10-CM | POA: Insufficient documentation

## 2010-07-10 DIAGNOSIS — F988 Other specified behavioral and emotional disorders with onset usually occurring in childhood and adolescence: Secondary | ICD-10-CM | POA: Insufficient documentation

## 2010-07-10 DIAGNOSIS — I10 Essential (primary) hypertension: Secondary | ICD-10-CM | POA: Insufficient documentation

## 2010-07-10 DIAGNOSIS — Z79899 Other long term (current) drug therapy: Secondary | ICD-10-CM | POA: Insufficient documentation

## 2010-07-10 DIAGNOSIS — Z951 Presence of aortocoronary bypass graft: Secondary | ICD-10-CM | POA: Insufficient documentation

## 2010-07-10 DIAGNOSIS — J4489 Other specified chronic obstructive pulmonary disease: Secondary | ICD-10-CM | POA: Insufficient documentation

## 2010-07-10 DIAGNOSIS — F319 Bipolar disorder, unspecified: Secondary | ICD-10-CM | POA: Insufficient documentation

## 2010-07-10 DIAGNOSIS — I509 Heart failure, unspecified: Secondary | ICD-10-CM | POA: Insufficient documentation

## 2010-07-21 ENCOUNTER — Emergency Department (HOSPITAL_COMMUNITY): Payer: Medicare Other

## 2010-07-21 ENCOUNTER — Emergency Department (HOSPITAL_COMMUNITY)
Admission: EM | Admit: 2010-07-21 | Discharge: 2010-07-21 | Disposition: A | Discharge status: Home / Self Care | Payer: Medicare Other | Attending: Emergency Medicine | Admitting: Emergency Medicine

## 2010-07-21 DIAGNOSIS — Z951 Presence of aortocoronary bypass graft: Secondary | ICD-10-CM | POA: Insufficient documentation

## 2010-07-21 DIAGNOSIS — E785 Hyperlipidemia, unspecified: Secondary | ICD-10-CM | POA: Insufficient documentation

## 2010-07-21 DIAGNOSIS — I10 Essential (primary) hypertension: Secondary | ICD-10-CM | POA: Insufficient documentation

## 2010-07-21 DIAGNOSIS — I2510 Atherosclerotic heart disease of native coronary artery without angina pectoris: Secondary | ICD-10-CM | POA: Insufficient documentation

## 2010-07-21 DIAGNOSIS — J4489 Other specified chronic obstructive pulmonary disease: Secondary | ICD-10-CM | POA: Insufficient documentation

## 2010-07-21 DIAGNOSIS — J449 Chronic obstructive pulmonary disease, unspecified: Secondary | ICD-10-CM | POA: Insufficient documentation

## 2010-07-21 DIAGNOSIS — F319 Bipolar disorder, unspecified: Secondary | ICD-10-CM | POA: Insufficient documentation

## 2010-07-21 DIAGNOSIS — Z8673 Personal history of transient ischemic attack (TIA), and cerebral infarction without residual deficits: Secondary | ICD-10-CM | POA: Insufficient documentation

## 2010-07-21 DIAGNOSIS — I509 Heart failure, unspecified: Secondary | ICD-10-CM | POA: Insufficient documentation

## 2010-07-21 DIAGNOSIS — Z954 Presence of other heart-valve replacement: Secondary | ICD-10-CM | POA: Insufficient documentation

## 2010-07-21 DIAGNOSIS — Z950 Presence of cardiac pacemaker: Secondary | ICD-10-CM | POA: Insufficient documentation

## 2010-07-21 LAB — DIFFERENTIAL
Lymphocytes Relative: 19 % (ref 12–46)
Lymphs Abs: 1.9 10*3/uL (ref 0.7–4.0)
Monocytes Relative: 9 % (ref 3–12)
Neutro Abs: 7 10*3/uL (ref 1.7–7.7)
Neutrophils Relative %: 71 % (ref 43–77)

## 2010-07-21 LAB — COMPREHENSIVE METABOLIC PANEL
AST: 25 U/L (ref 0–37)
CO2: 26 mEq/L (ref 19–32)
Calcium: 9.2 mg/dL (ref 8.4–10.5)
Creatinine, Ser: 1.36 mg/dL (ref 0.4–1.5)
GFR calc Af Amer: >60 mL/min (ref >60)
GFR calc non Af Amer: 53 mL/min — ABNORMAL LOW (ref >60)

## 2010-07-21 LAB — RAPID URINE DRUG SCREEN, HOSP PERFORMED
Amphetamines: NOT DETECTED
Barbiturates: NOT DETECTED
Benzodiazepines: NOT DETECTED

## 2010-07-21 LAB — CBC
HCT: 46.5 % (ref 39.0–52.0)
Hemoglobin: 15.6 g/dL (ref 13.0–17.0)
MCH: 28.9 pg (ref 26.0–34.0)
MCV: 86.3 fL (ref 78.0–100.0)
RBC: 5.39 MIL/uL (ref 4.22–5.81)

## 2010-07-21 LAB — URINALYSIS, ROUTINE W REFLEX MICROSCOPIC
Ketones, ur: NEGATIVE mg/dL
Nitrite: NEGATIVE
Protein, ur: NEGATIVE mg/dL
Urobilinogen, UA: 0.2 mg/dL (ref 0.0–1.0)
pH: 6 (ref 5.0–8.0)

## 2010-07-21 LAB — AMMONIA: Ammonia: 31 umol/L (ref 11–35)

## 2010-07-21 LAB — PROTIME-INR: INR: 1.6 — ABNORMAL HIGH (ref 0.00–1.49)

## 2010-07-23 LAB — ICD DEVICE OBSERVATION

## 2010-07-24 ENCOUNTER — Emergency Department (HOSPITAL_COMMUNITY): Payer: Medicare Other

## 2010-07-24 ENCOUNTER — Emergency Department (HOSPITAL_COMMUNITY)
Admission: EM | Admit: 2010-07-24 | Discharge: 2010-07-24 | Disposition: A | Discharge status: Home / Self Care | Payer: Medicare Other | Attending: Emergency Medicine | Admitting: Emergency Medicine

## 2010-07-24 DIAGNOSIS — J4489 Other specified chronic obstructive pulmonary disease: Secondary | ICD-10-CM | POA: Insufficient documentation

## 2010-07-24 DIAGNOSIS — J449 Chronic obstructive pulmonary disease, unspecified: Secondary | ICD-10-CM | POA: Insufficient documentation

## 2010-07-24 DIAGNOSIS — F411 Generalized anxiety disorder: Secondary | ICD-10-CM | POA: Insufficient documentation

## 2010-07-24 DIAGNOSIS — I10 Essential (primary) hypertension: Secondary | ICD-10-CM | POA: Insufficient documentation

## 2010-07-24 DIAGNOSIS — I2510 Atherosclerotic heart disease of native coronary artery without angina pectoris: Secondary | ICD-10-CM | POA: Insufficient documentation

## 2010-07-24 DIAGNOSIS — Z8673 Personal history of transient ischemic attack (TIA), and cerebral infarction without residual deficits: Secondary | ICD-10-CM | POA: Insufficient documentation

## 2010-07-24 DIAGNOSIS — R51 Headache: Secondary | ICD-10-CM | POA: Insufficient documentation

## 2010-07-24 DIAGNOSIS — Z954 Presence of other heart-valve replacement: Secondary | ICD-10-CM | POA: Insufficient documentation

## 2010-07-24 DIAGNOSIS — E785 Hyperlipidemia, unspecified: Secondary | ICD-10-CM | POA: Insufficient documentation

## 2010-07-24 DIAGNOSIS — Z950 Presence of cardiac pacemaker: Secondary | ICD-10-CM | POA: Insufficient documentation

## 2010-07-24 DIAGNOSIS — I509 Heart failure, unspecified: Secondary | ICD-10-CM | POA: Insufficient documentation

## 2010-07-24 DIAGNOSIS — J329 Chronic sinusitis, unspecified: Secondary | ICD-10-CM | POA: Insufficient documentation

## 2010-07-24 DIAGNOSIS — Z951 Presence of aortocoronary bypass graft: Secondary | ICD-10-CM | POA: Insufficient documentation

## 2010-07-24 DIAGNOSIS — R0602 Shortness of breath: Secondary | ICD-10-CM | POA: Insufficient documentation

## 2010-07-24 LAB — DIFFERENTIAL
Basophils Absolute: 0 10*3/uL (ref 0.0–0.1)
Eosinophils Absolute: 0.1 10*3/uL (ref 0.0–0.7)
Lymphocytes Relative: 18 % (ref 12–46)
Lymphs Abs: 2.2 10*3/uL (ref 0.7–4.0)
Neutrophils Relative %: 72 % (ref 43–77)

## 2010-07-24 LAB — URINALYSIS, ROUTINE W REFLEX MICROSCOPIC
Nitrite: NEGATIVE
Specific Gravity, Urine: 1.005 (ref 1.005–1.030)
pH: 6.5 (ref 5.0–8.0)

## 2010-07-24 LAB — CBC
HCT: 43.8 % (ref 39.0–52.0)
MCV: 87.8 fL (ref 78.0–100.0)
Platelets: 146 10*3/uL — ABNORMAL LOW (ref 150–400)
RBC: 4.99 MIL/uL (ref 4.22–5.81)
WBC: 12.2 10*3/uL — ABNORMAL HIGH (ref 4.0–10.5)

## 2010-07-24 LAB — POCT I-STAT, CHEM 8
Chloride: 104 mEq/L (ref 96–112)
HCT: 45 % (ref 39.0–52.0)
Potassium: 3.7 mEq/L (ref 3.5–5.1)

## 2010-07-24 LAB — POCT CARDIAC MARKERS: Myoglobin, poc: 103 ng/mL (ref 12–200)

## 2010-07-24 LAB — PROTIME-INR: INR: 1.23 (ref 0.00–1.49)

## 2010-07-24 LAB — RAPID URINE DRUG SCREEN, HOSP PERFORMED
Opiates: NOT DETECTED
Tetrahydrocannabinol: NOT DETECTED

## 2010-07-29 ENCOUNTER — Ambulatory Visit (HOSPITAL_COMMUNITY): Payer: Medicare Other | Admitting: Physician Assistant

## 2010-08-08 ENCOUNTER — Encounter (INDEPENDENT_AMBULATORY_CARE_PROVIDER_SITE_OTHER): Payer: Self-pay | Admitting: *Deleted

## 2010-08-14 NOTE — Letter (Signed)
Summary: Appointment - Reminder 2  [facility], Main Office  [address]   [place], [place] [address]   Phone: [phone]  Fax: [phone]     August 08, 2010 MRN: [id number]   [name] [address] [place], [place]  [address]   Dear [name],  Our records indicate that it is time to schedule a follow-up appointment.  Dr.Edmunds recommended that you follow up with Dr.Allred. It is very important that we reach you to schedule this appointment. We look forward to participating in your health care needs. Please contact us at the number listed above at your earliest convenience to schedule your appointment.  If you are unable to make an appointment at this time, give [place] a call so we can update our records.     Sincerely,   Glass blower/designer

## 2010-08-19 LAB — CARDIAC PANEL(CRET KIN+CKTOT+MB+TROPI)
CK, MB: 3.4 ng/mL (ref 0.3–4.0)
CK, MB: 3.4 ng/mL (ref 0.3–4.0)
Relative Index: 2.2 (ref 0.0–2.5)
Relative Index: 2.3 (ref 0.0–2.5)
Total CK: 149 U/L (ref 7–232)
Total CK: 154 U/L (ref 7–232)

## 2010-08-19 LAB — CBC
HCT: 39.9 % (ref 39.0–52.0)
HCT: 42 % (ref 39.0–52.0)
HCT: 43 % (ref 39.0–52.0)
Hemoglobin: 13.6 g/dL (ref 13.0–17.0)
MCH: 28 pg (ref 26.0–34.0)
MCH: 28.2 pg (ref 26.0–34.0)
MCH: 28.7 pg (ref 26.0–34.0)
MCH: 28.7 pg (ref 26.0–34.0)
MCH: 28.8 pg (ref 26.0–34.0)
MCHC: 31.5 g/dL (ref 30.0–36.0)
MCHC: 31.7 g/dL (ref 30.0–36.0)
MCV: 88.5 fL (ref 78.0–100.0)
MCV: 89.3 fL (ref 78.0–100.0)
MCV: 90.4 fL (ref 78.0–100.0)
MCV: 90.7 fL (ref 78.0–100.0)
MCV: 90.8 fL (ref 78.0–100.0)
Platelets: 242 10*3/uL (ref 150–400)
Platelets: 253 10*3/uL (ref 150–400)
Platelets: 272 10*3/uL (ref 150–400)
Platelets: 275 10*3/uL (ref 150–400)
Platelets: 290 10*3/uL (ref 150–400)
RBC: 4.5 MIL/uL (ref 4.22–5.81)
RBC: 4.51 MIL/uL (ref 4.22–5.81)
RBC: 4.66 MIL/uL (ref 4.22–5.81)
RBC: 4.73 MIL/uL (ref 4.22–5.81)
RDW: 14.3 % (ref 11.5–15.5)
RDW: 14.4 % (ref 11.5–15.5)
RDW: 14.5 % (ref 11.5–15.5)
RDW: 14.7 % (ref 11.5–15.5)
RDW: 14.7 % (ref 11.5–15.5)
RDW: 14.9 % (ref 11.5–15.5)
WBC: 8.7 10*3/uL (ref 4.0–10.5)
WBC: 8.7 10*3/uL (ref 4.0–10.5)
WBC: 9.5 10*3/uL (ref 4.0–10.5)
WBC: 9.6 10*3/uL (ref 4.0–10.5)

## 2010-08-19 LAB — BASIC METABOLIC PANEL
BUN: 14 mg/dL (ref 6–23)
BUN: 16 mg/dL (ref 6–23)
CO2: 24 mEq/L (ref 19–32)
CO2: 25 mEq/L (ref 19–32)
CO2: 28 mEq/L (ref 19–32)
Calcium: 9 mg/dL (ref 8.4–10.5)
Calcium: 9 mg/dL (ref 8.4–10.5)
Chloride: 102 mEq/L (ref 96–112)
Chloride: 103 mEq/L (ref 96–112)
Chloride: 107 mEq/L (ref 96–112)
Creatinine, Ser: 1 mg/dL (ref 0.4–1.5)
Creatinine, Ser: 1.24 mg/dL (ref 0.4–1.5)
Creatinine, Ser: 1.28 mg/dL (ref 0.4–1.5)
GFR calc Af Amer: >60 mL/min (ref >60)
GFR calc Af Amer: >60 mL/min (ref >60)
GFR calc Af Amer: >60 mL/min (ref >60)
GFR calc non Af Amer: 57 mL/min — ABNORMAL LOW (ref >60)
Glucose, Bld: 101 mg/dL — ABNORMAL HIGH (ref 70–99)
Glucose, Bld: 97 mg/dL (ref 70–99)
Potassium: 3.4 mEq/L — ABNORMAL LOW (ref 3.5–5.1)
Potassium: 4.7 mEq/L (ref 3.5–5.1)
Sodium: 134 mEq/L — ABNORMAL LOW (ref 135–145)
Sodium: 139 mEq/L (ref 135–145)

## 2010-08-19 LAB — PROTIME-INR
INR: 1.11 (ref 0.00–1.49)
INR: 1.13 (ref 0.00–1.49)
INR: 1.37 (ref 0.00–1.49)
INR: 1.64 — ABNORMAL HIGH (ref 0.00–1.49)
Prothrombin Time: 14.5 seconds (ref 11.6–15.2)
Prothrombin Time: 17.1 seconds — ABNORMAL HIGH (ref 11.6–15.2)
Prothrombin Time: 23.9 seconds — ABNORMAL HIGH (ref 11.6–15.2)

## 2010-08-19 LAB — HEPARIN LEVEL (UNFRACTIONATED)
Heparin Unfractionated: 0.3 IU/mL (ref 0.30–0.70)
Heparin Unfractionated: 0.32 IU/mL (ref 0.30–0.70)
Heparin Unfractionated: 0.41 IU/mL (ref 0.30–0.70)
Heparin Unfractionated: 0.81 IU/mL — ABNORMAL HIGH (ref 0.30–0.70)

## 2010-08-19 LAB — BRAIN NATRIURETIC PEPTIDE
Pro B Natriuretic peptide (BNP): 116 pg/mL — ABNORMAL HIGH (ref 0.0–100.0)
Pro B Natriuretic peptide (BNP): 238 pg/mL — ABNORMAL HIGH (ref 0.0–100.0)

## 2010-08-19 LAB — TSH: TSH: 0.737 u[IU]/mL (ref 0.350–4.500)

## 2010-08-19 LAB — LIPID PANEL: Cholesterol: 145 mg/dL (ref 0–200)

## 2010-08-20 LAB — POCT CARDIAC MARKERS
CKMB, poc: 2.1 ng/mL (ref 1.0–8.0)
Myoglobin, poc: 129 ng/mL (ref 12–200)
Troponin i, poc: <0.05 ng/mL (ref 0.00–0.09)

## 2010-08-20 LAB — POCT I-STAT, CHEM 8
BUN: 9 mg/dL (ref 6–23)
Calcium, Ion: 1.03 mmol/L — ABNORMAL LOW (ref 1.12–1.32)
Chloride: 108 mEq/L (ref 96–112)
HCT: 38 % — ABNORMAL LOW (ref 39.0–52.0)
Potassium: 3.7 mEq/L (ref 3.5–5.1)

## 2010-08-20 LAB — PROTIME-INR
INR: 1.14 (ref 0.00–1.49)
Prothrombin Time: 16 seconds — ABNORMAL HIGH (ref 11.6–15.2)

## 2010-08-20 LAB — D-DIMER, QUANTITATIVE: D-Dimer, Quant: 1.04 ug/mL-FEU — ABNORMAL HIGH (ref 0.00–0.48)

## 2010-08-21 LAB — URINE MICROSCOPIC-ADD ON

## 2010-08-21 LAB — URINALYSIS, ROUTINE W REFLEX MICROSCOPIC
Bilirubin Urine: NEGATIVE
Glucose, UA: NEGATIVE mg/dL
Hgb urine dipstick: NEGATIVE
Ketones, ur: NEGATIVE mg/dL
Protein, ur: NEGATIVE mg/dL
Urobilinogen, UA: 0.2 mg/dL (ref 0.0–1.0)

## 2010-08-21 LAB — ETHANOL: Alcohol, Ethyl (B): 5 mg/dL (ref 0–10)

## 2010-08-21 LAB — COMPREHENSIVE METABOLIC PANEL
ALT: 20 U/L (ref 0–53)
Albumin: 4.3 g/dL (ref 3.5–5.2)
BUN: 20 mg/dL (ref 6–23)
Calcium: 9.6 mg/dL (ref 8.4–10.5)
Glucose, Bld: 98 mg/dL (ref 70–99)
Potassium: 4.7 mEq/L (ref 3.5–5.1)
Sodium: 141 mEq/L (ref 135–145)
Total Protein: 8.2 g/dL (ref 6.0–8.3)

## 2010-08-21 LAB — DIFFERENTIAL
Lymphs Abs: 1.3 10*3/uL (ref 0.7–4.0)
Monocytes Absolute: 0.8 10*3/uL (ref 0.1–1.0)
Monocytes Relative: 10 % (ref 3–12)
Neutro Abs: 5.4 10*3/uL (ref 1.7–7.7)
Neutrophils Relative %: 69 % (ref 43–77)

## 2010-08-21 LAB — PROTIME-INR
INR: 1.79 — ABNORMAL HIGH (ref 0.00–1.49)
INR: 1.79 — ABNORMAL HIGH (ref 0.00–1.49)
INR: 1.92 — ABNORMAL HIGH (ref 0.00–1.49)
INR: 1.96 — ABNORMAL HIGH (ref 0.00–1.49)
INR: 2.34 — ABNORMAL HIGH (ref 0.00–1.49)
Prothrombin Time: 22.1 seconds — ABNORMAL HIGH (ref 11.6–15.2)
Prothrombin Time: 22.5 seconds — ABNORMAL HIGH (ref 11.6–15.2)

## 2010-08-21 LAB — CBC
HCT: 41.3 % (ref 39.0–52.0)
MCHC: 33.8 g/dL (ref 30.0–36.0)
Platelets: 174 10*3/uL (ref 150–400)
RDW: 14.6 % (ref 11.5–15.5)
WBC: 7.9 10*3/uL (ref 4.0–10.5)

## 2010-08-21 LAB — RAPID URINE DRUG SCREEN, HOSP PERFORMED
Amphetamines: NOT DETECTED
Benzodiazepines: NOT DETECTED
Cocaine: NOT DETECTED

## 2010-08-21 LAB — RPR: RPR Ser Ql: NONREACTIVE

## 2010-08-21 LAB — APTT: aPTT: 41 seconds — ABNORMAL HIGH (ref 24–37)

## 2010-09-13 LAB — CBC
Hemoglobin: 11.6 g/dL — ABNORMAL LOW (ref 13.0–17.0)
MCHC: 33.2 g/dL (ref 30.0–36.0)
MCHC: 33.4 g/dL (ref 30.0–36.0)
Platelets: 179 10*3/uL (ref 150–400)
Platelets: 193 10*3/uL (ref 150–400)
RBC: 3.32 MIL/uL — ABNORMAL LOW (ref 4.22–5.81)
RBC: 3.88 MIL/uL — ABNORMAL LOW (ref 4.22–5.81)
WBC: 11.4 10*3/uL — ABNORMAL HIGH (ref 4.0–10.5)
WBC: 9.3 10*3/uL (ref 4.0–10.5)

## 2010-09-13 LAB — TYPE AND SCREEN: DAT, IgG: NEGATIVE

## 2010-09-13 LAB — URINALYSIS, MICROSCOPIC ONLY
Bilirubin Urine: NEGATIVE
Leukocytes, UA: NEGATIVE
Nitrite: NEGATIVE
Specific Gravity, Urine: 1.005 (ref 1.005–1.030)
pH: 8 (ref 5.0–8.0)

## 2010-09-13 LAB — BASIC METABOLIC PANEL
BUN: 21 mg/dL (ref 6–23)
BUN: 25 mg/dL — ABNORMAL HIGH (ref 6–23)
CO2: 23 mEq/L (ref 19–32)
CO2: 24 mEq/L (ref 19–32)
CO2: 24 mEq/L (ref 19–32)
CO2: 29 mEq/L (ref 19–32)
Calcium: 8.2 mg/dL — ABNORMAL LOW (ref 8.4–10.5)
Calcium: 8.6 mg/dL (ref 8.4–10.5)
Calcium: 8.6 mg/dL (ref 8.4–10.5)
Chloride: 102 mEq/L (ref 96–112)
Chloride: 106 mEq/L (ref 96–112)
Chloride: 110 mEq/L (ref 96–112)
Creatinine, Ser: 1.16 mg/dL (ref 0.4–1.5)
Creatinine, Ser: 1.23 mg/dL (ref 0.4–1.5)
Creatinine, Ser: 1.68 mg/dL — ABNORMAL HIGH (ref 0.4–1.5)
GFR calc Af Amer: 51 mL/min — ABNORMAL LOW (ref >60)
GFR calc Af Amer: >60 mL/min (ref >60)
GFR calc Af Amer: >60 mL/min (ref >60)
GFR calc non Af Amer: 42 mL/min — ABNORMAL LOW (ref >60)
GFR calc non Af Amer: 60 mL/min — ABNORMAL LOW (ref >60)
Glucose, Bld: 120 mg/dL — ABNORMAL HIGH (ref 70–99)
Glucose, Bld: 127 mg/dL — ABNORMAL HIGH (ref 70–99)
Potassium: 5.5 mEq/L — ABNORMAL HIGH (ref 3.5–5.1)
Sodium: 135 mEq/L (ref 135–145)
Sodium: 136 mEq/L (ref 135–145)
Sodium: 138 mEq/L (ref 135–145)

## 2010-09-13 LAB — PROTIME-INR
INR: 2.7 — ABNORMAL HIGH (ref 0.00–1.49)
INR: 2.9 — ABNORMAL HIGH (ref 0.00–1.49)
INR: 3.1 — ABNORMAL HIGH (ref 0.00–1.49)
INR: 4 — ABNORMAL HIGH (ref 0.00–1.49)
Prothrombin Time: 28.2 seconds — ABNORMAL HIGH (ref 11.6–15.2)
Prothrombin Time: 29.8 seconds — ABNORMAL HIGH (ref 11.6–15.2)
Prothrombin Time: 31.3 seconds — ABNORMAL HIGH (ref 11.6–15.2)

## 2010-09-13 LAB — POCT I-STAT, CHEM 8
BUN: 26 mg/dL — ABNORMAL HIGH (ref 6–23)
Chloride: 101 mEq/L (ref 96–112)
Sodium: 131 mEq/L — ABNORMAL LOW (ref 135–145)

## 2010-09-13 LAB — LACTIC ACID, PLASMA: Lactic Acid, Venous: 1.7 mmol/L (ref 0.5–2.2)

## 2010-09-13 LAB — HEMOGLOBIN AND HEMATOCRIT, BLOOD: HCT: 35.8 % — ABNORMAL LOW (ref 39.0–52.0)

## 2010-09-13 LAB — ETHANOL: Alcohol, Ethyl (B): 181 mg/dL — ABNORMAL HIGH (ref 0–10)

## 2010-09-14 LAB — POCT CARDIAC MARKERS
CKMB, poc: 1.5 ng/mL (ref 1.0–8.0)
Troponin i, poc: <0.05 ng/mL (ref 0.00–0.09)

## 2010-09-14 LAB — DIFFERENTIAL
Basophils Absolute: 0.1 10*3/uL (ref 0.0–0.1)
Basophils Relative: 1 % (ref 0–1)
Eosinophils Absolute: 0.1 10*3/uL (ref 0.0–0.7)
Neutro Abs: 7.8 10*3/uL — ABNORMAL HIGH (ref 1.7–7.7)
Neutrophils Relative %: 77 % (ref 43–77)

## 2010-09-14 LAB — CBC
MCHC: 33.3 g/dL (ref 30.0–36.0)
Platelets: 148 10*3/uL — ABNORMAL LOW (ref 150–400)
RBC: 4.42 MIL/uL (ref 4.22–5.81)
RDW: 14.8 % (ref 11.5–15.5)

## 2010-09-14 LAB — CARDIAC PANEL(CRET KIN+CKTOT+MB+TROPI)
Relative Index: 1.6 (ref 0.0–2.5)
Relative Index: 1.8 (ref 0.0–2.5)
Troponin I: 0.04 ng/mL (ref 0.00–0.06)
Troponin I: 0.04 ng/mL (ref 0.00–0.06)

## 2010-09-14 LAB — BASIC METABOLIC PANEL
BUN: 11 mg/dL (ref 6–23)
BUN: 18 mg/dL (ref 6–23)
CO2: 25 mEq/L (ref 19–32)
CO2: 26 mEq/L (ref 19–32)
CO2: 30 mEq/L (ref 19–32)
Calcium: 8.8 mg/dL (ref 8.4–10.5)
Calcium: 9.2 mg/dL (ref 8.4–10.5)
Calcium: 9.3 mg/dL (ref 8.4–10.5)
Chloride: 101 mEq/L (ref 96–112)
Creatinine, Ser: 1.18 mg/dL (ref 0.4–1.5)
Creatinine, Ser: 1.37 mg/dL (ref 0.4–1.5)
Creatinine, Ser: 1.54 mg/dL — ABNORMAL HIGH (ref 0.4–1.5)
Creatinine, Ser: 1.83 mg/dL — ABNORMAL HIGH (ref 0.4–1.5)
GFR calc Af Amer: 46 mL/min — ABNORMAL LOW (ref >60)
GFR calc Af Amer: 56 mL/min — ABNORMAL LOW (ref >60)
GFR calc Af Amer: >60 mL/min (ref >60)
GFR calc non Af Amer: 38 mL/min — ABNORMAL LOW (ref >60)
Glucose, Bld: 109 mg/dL — ABNORMAL HIGH (ref 70–99)
Glucose, Bld: 99 mg/dL (ref 70–99)
Potassium: 3.9 mEq/L (ref 3.5–5.1)
Sodium: 135 mEq/L (ref 135–145)

## 2010-09-14 LAB — COMPREHENSIVE METABOLIC PANEL
ALT: 22 U/L (ref 0–53)
AST: 28 U/L (ref 0–37)
Albumin: 4 g/dL (ref 3.5–5.2)
CO2: 27 mEq/L (ref 19–32)
Calcium: 9.3 mg/dL (ref 8.4–10.5)
Chloride: 109 mEq/L (ref 96–112)
GFR calc Af Amer: >60 mL/min (ref >60)
GFR calc non Af Amer: 60 mL/min — ABNORMAL LOW (ref >60)
Sodium: 141 mEq/L (ref 135–145)
Total Bilirubin: 1.4 mg/dL — ABNORMAL HIGH (ref 0.3–1.2)

## 2010-09-14 LAB — BRAIN NATRIURETIC PEPTIDE
Pro B Natriuretic peptide (BNP): 457 pg/mL — ABNORMAL HIGH (ref 0.0–100.0)
Pro B Natriuretic peptide (BNP): 65 pg/mL (ref 0.0–100.0)
Pro B Natriuretic peptide (BNP): 655 pg/mL — ABNORMAL HIGH (ref 0.0–100.0)

## 2010-09-14 LAB — PROTIME-INR
INR: 1.4 (ref 0.00–1.49)
INR: 1.5 (ref 0.00–1.49)
INR: 1.9 — ABNORMAL HIGH (ref 0.00–1.49)
Prothrombin Time: 18 seconds — ABNORMAL HIGH (ref 11.6–15.2)
Prothrombin Time: 18.3 seconds — ABNORMAL HIGH (ref 11.6–15.2)
Prothrombin Time: 23.3 seconds — ABNORMAL HIGH (ref 11.6–15.2)

## 2010-09-14 LAB — CK TOTAL AND CKMB (NOT AT ARMC): Relative Index: 2.4 (ref 0.0–2.5)

## 2010-10-21 NOTE — H&P (Signed)
[name], [name] NO.:  [id number]   MEDICAL RECORD NO.:  [id number]          PATIENT TYPE:  INP   LOCATION:  2013                         FACILITY:  [facility]   PHYSICIAN:  Michiel Cowboy, MDDATE OF BIRTH:  [birth year]   DATE OF ADMISSION:  02/23/2008  DATE OF DISCHARGE:                              HISTORY & PHYSICAL   PRIMARY CARE PHYSICIAN:  Dr. Joselyn Arrow.   CHIEF COMPLAINT:  Shortness of breath, nausea and staggering.   Mr. Prieur is a 65 year old gentleman with a history of CHF with EF  20%, history of atrial fibrillation status post pacemaker implantation  as well as history of mitral valve regurgitation and left atrial mural  thrombus for which he is on Coumadin.  Also reportedly status post valve  replacement.   The patient was at his baseline of health when he noticed yesterday he  was somewhat staggering and feeling lightheaded or dizzy headed; he is  not quite sure which way.  This was transient for a few hours and then  went away.  Today, this feeling somewhat returned, but he was also  feeling more short of breath, having a severe headache, nausea; no chest  pain.  He went ahead and called EMS who presented and noted that his  blood pressure was somewhat elevated at systolic 170.  At this point,  they took him to emergency department where his chest x-ray showed mild  interstitial edema, and his BMP was noted to be elevated at 600.  His  INR was subtherapeutic at 1.2 feet, at which point [facility] hospitalist  called to admit the patient.   REVIEW OF SYSTEMS:  No fevers, no chills, no chest pain, no diarrhea.  Endorses nausea but no vomiting.  No lower extremity edema.  No  localized weakness.  The patient is a fairly poor historian.   PAST MEDICAL HISTORY:  Includes:  1. History of recent chest wall pocket hematoma after implantation of      ICD in June.  2. History of mitral valve regurgitation.  3. Left atrial mural thrombosis.  4.  Class III congestive heart failure.  5. __________ EF of 20%.  6. Dilated cardiomyopathy, EF of 20%.  7. History of atrial fibrillation.  8. Hypertension  9. Chronic left bundle branch block.  10.History of multiple embolic strokes.  11.History of remote mitral valve replacement in 1982 and then      replaced in 2008 with tissue valve, with multiple episodes of      emboli and clot in mitral valve.  12.History of noncompliance with Coumadin systemic anticoagulation.  13.History of retroperitoneal hematoma in January 2009 secondary to      heparin overdose.  14.Hyperlipidemia.  15.Coronary artery disease status post percutaneous coronary      intervention, stent implantation left circumflex.   SOCIAL HISTORY:  The patient is a former smoker, does not smoke  currently.  Reports occasional drink, in particular 6 beers on the  weekend.  Lives at home.  Walks usually without assistance.   FAMILY HISTORY:  Noncontributory.   ALLERGIES:  No drug allergies.  MEDICATIONS:  1. Amiodarone 400 mg p.o. once a day.  2. Coumadin 5 mg once a day.  3. Digoxin 250 mcg daily.  4. Lisinopril 5 mg b.i.d.  5. Potassium 20 mEq daily.  6. Aspirin 81 mg p.o. daily.  7. Zetia 10 mg p.o. daily.  8. Crestor 20 mg daily.  9. Coreg 6.25 mg twice a day.  10.__________ as needed.   PHYSICAL EXAMINATION:  VITALS:  Temperature 97.1, blood pressure 177/88  - now down to 168/85, pulse 62, respirations 18, saturating 96% on room  air.  The patient appears to be in no acute distress currently.  HEAD:  Nontraumatic.  Moist mucous membranes.  NECK:  Supple.  No lymphadenopathy noted.  LUNGS:  Crackles bilaterally but good air movement.  Otherwise no  wheezes.  HEART:  Crisp heart sounds noted.  No murmurs could be appreciated.  Regular rate and rhythm.  LOWER EXTREMITY:  Without edema or clubbing, cyanosis.  ABDOMEN:  Soft, nontender, nondistended.  Strength questionable right leg weakness, not sure if  secondary to back  pain versus true weakness.  Grip equal bilaterally.  Cranial nerves II-  XII intact.  No droop noted.   LABORATORY DATA:  White blood cell count 9.1, hemoglobin 12.2.  Sodium  141, potassium 4.3, creatinine 1.13.  CK-MB and troponin are within  normal limits.  BNP 663 from a baseline of 179.  Digoxin level less than  0.2.  EKG shows paced rhythm.  INR 1.2.  CT scan of the head negative  for acute intracranial abnormality but does shows chronic atrophy.  Chest x-ray showed interstitial markings increased but no overt edema.   ASSESSMENT AND PLAN:  This is a 65 year old gentleman presenting with  shortness of breath and transient neurologic abnormalities.  1. Congestive heart failure, probably mild exacerbation with elevated      BNP and slight worsening of interstitial markings.  We will give IV      Lasix.  Follow BNP.  Strict I&Os.  Cycle cardiac enzymes.  Admit to      tele.  Check TSH.  Would recommend cardiology consult in a.m. as      the patient has multiple cardiac problems and has ejection fraction      of 20%.  2. Subtherapeutic INR.  The patient is supposed to be on Coumadin for      valve replacement with multiple episodes of clotting embolic events      and intramural thrombus.  We will start him on heparin drip since      Coumadin is subtherapeutic.  3. Transient neurological change.  Unable to perform MRI secondary to      the patient having a pacemaker.  Will perform serial neurological      exams, not being indicative of an acute stroke right now.  We will      follow.  The patient may have potentially embolic events and      transient ischemic attacks.  For now, we will make sure that he      continues on his Coumadin and will have to do heparin drip to      minimize risk of further embolic events, if      any.  4. Hypertension.  Increase lisinopril to 10 mg b.i.d.  5. Prophylaxis.  Protonix, plus patient should be on Coumadin.      Michiel Cowboy, MD  Electronically Signed     AVD/MEDQ  D:  02/24/2008  T:  02/24/2008  Job:  [id number]   cc:   Lavonda Jumbo, M.D.  Amil Amen, M.D.

## 2010-10-21 NOTE — H&P (Signed)
NAMERIVER, AMBROSIO NO.:  [id number]   MEDICAL RECORD NO.:  [id number]          PATIENT TYPE:  INP   LOCATION:  4714                         FACILITY:  [facility]   PHYSICIAN:  Joylene John, MD       DATE OF BIRTH:  April 11, 1946   DATE OF ADMISSION:  03/17/2008  DATE OF DISCHARGE:                              HISTORY & PHYSICAL   REASON FOR ADMISSION:  Increasing shortness of breath.   HISTORY OF PRESENT ILLNESS:  This is a 65 year old white male coming in  with shortness of breath which started today per the patient.  The  patient was lethargic on exam and was a poor historian.  According to  the patient, he was with his daughter when he started not feeling well  and complained of shortness of breath.  The daughter called 911, and the  patient presented to the ER for further medical management.   PAST MEDICAL HISTORY:  1. History of AICD.  2. Mitral valve replacement.  3. Left atrial thrombus.  4. CHF, EF of approximately 20%.  5. Dilated cardiomyopathy.  6. AFib.  7. Hypertension.  8. Chronic left bundle-branch block.  9. Hyperlipidemia.  10.Coronary artery disease.  11.Recent hospitalization for similar complaints.  12.Weakness.  13.Dizziness.   FAMILY HISTORY:  Noncontributory.   SOCIAL HISTORY:  The patient was a former smoker, quit in 2004, social  drinker.  No drugs.  Divorced.  Living by himself.   ALLERGIES:  No known allergies.   MEDICATIONS:  Crestor, Coreg, Lasix, digoxin, lisinopril, aspirin,  Coumadin, amiodarone, and potassium chloride.   REVIEW OF SYSTEMS:  The patient complained of shortness of breath and  not feeling well.  Denied any nausea, vomiting, chest pain, dizziness,  lightheadedness, fevers, or chills. Rest of the 14 point ros was -ve   PHYSICAL EXAMINATION:  VITAL SIGNS:  Temperature, which was highest  rectally 103.1, pulse of 60, respirations 20, blood pressure 140/59.  GENERAL:  The patient is lethargic.  NEUROLOGIC:   Nonfocal.  He was arousable on verbal command.  NECK:  Supple.  No JVD was appreciated.  LUNGS:  There are decreased breath sounds bilaterally with bilateral  basal crackles.  CARDIOVASCULAR:  Regular rate and rhythm.  ABDOMEN:  Belly was soft.  Bowel sounds present.  LOWER EXTREMITIES:  No lower extremity edema.   LABORATORY DATA:  Chest x-ray showed left lower lobe pneumonia with  chronic lung disease.  Blood cultures are pending.  CBC showed a white  count of 15,000, hemoglobin of 11, hematocrit of 33.4, and platelets  209.  Sodium 133, potassium 4.2, chloride 101, bicarb 24, BUN 20,  creatinine 1.35, and glucose 123.  Calcium of 8.9.  INR was 2.  Digoxin  level was 1.4.  ABG was obtained, which showed a pH of 7.4, O2 sat of  93, and CO2 level of 38.   ASSESSMENT AND PLAN:  A 65 year old male with extensive cardiac history  coming in with shortness of breath with a new left lower lobe pneumonia  on chest x-ray.  Plan is to admit the patient to  a tele bed and do  serial cardiac enzymes since the patient has extensive cardiac history  and is a poor historian with crackles on exam.  For pneumonia, plan is  to monitor the blood cultures.  Give antibiotics.  We will give  vancomycin and levofloxacin to cover for hospital-acquired pneumonia and  MRSA since the patient was recently in the hospital.  The patient is  lethargic.  His gases  within normal limits.  We will get a CT head to  rule out any acute pathology, cannot get an MRI since the patient has a  pacemaker.  We will continue his home medications with the exception.  We will hold lisinopril.  We will hold the potassium chloride.  We will  hold digoxin for now.  Plan is to repeat the labs in the morning along  with a vancomycin level in the morning.  Vancomycin can be redosed based  on the level in the morning.  Plan:  We will also give Lasix 40 mg x1  since the patient has crackles in exam, and then Lasix can be given   periodically after that as needed.      Joylene John, MD  Electronically Signed     RP/MEDQ  D:  03/17/2008  T:  03/18/2008  Job:  [id number]
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
## 2010-10-21 NOTE — Consult Note (Signed)
NAMEVIRGAL, [name] NO.:  [id number]   MEDICAL RECORD NO.:  [id number]          PATIENT TYPE:  INP   LOCATION:  4706                         FACILITY:  [facility]   PHYSICIAN:  [name], M.D.  DATE OF BIRTH:  03/02/1946   DATE OF CONSULTATION:  07/15/2007  DATE OF DISCHARGE:                                 CONSULTATION   REFERRING PHYSICIAN:  Lavonda [name], M.D.   REASON FOR CONSULTATION:  Apparent left atrial thrombus.   HISTORY OF PRESENT ILLNESS:  [name] is very complex 65-year-  old man with a history of coronary artery disease, status post stent  implantation in the left circumflex and a Bjork- Shiley mitral valve  replacement in 1982.  He underwent a redo in 2008 by Dr. Dorris [name]  with a pericardial tissue valve; this was placed because of a history of  multiple emboli likely arising from the metallic valve and left atrium.  These emboli have involved multiple organs even while on Coumadin, but  there is some question of overall compliance.  The mitral valve itself  was thrombosed and this also led to the review of the valve in 2008.   He was recently admitted at [facility]  [place], which required  intubation.  During that time, he was treated with intravenous heparin  and was inadvertently bolused with 25,000 units of heparin.  He  subsequently developed a retroperitoneal hematoma, which is still  resolving.  He still complains of right flank pain.  Because apparently  of this right-sided abdominal and flank pain, a CT scan was ordered  today by his primary MD, which shows a cystic structure in the right  retroperitoneum as well as a large amount of thrombus in the posterior  aspect of the left atrium.  The patient was then referred directly here  for admission.   He, according to his daughter, has been a having frequent nausea and  vomiting and been unable  tolerate much p.o. except for clear liquids of  some form since his discharge from [facility] about 2 weeks ago.  He has therefore had only had questionable compliance with his  medications.  His INR today on admission is 1.8.   PAST MEDICAL HISTORY:  1. Atrial flutter, status post ablation, Dr. Lewayne [name], with      permanent pacemaker insertion, underlying left bundle branch block.  2. History of noncompliance with Coumadin.  3. 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  4. Hypertension.  5. Dyslipidemia.  6. Ischemic and nonischemic cardiomyopathy, LVEF 20% by 2-D echo,      [name] 2008, [facility], current reassessment pending.  7. ASCVD, single vessel, status post stent implantation, left      circumflex.   SOCIAL HISTORY:  Former EtOH abuse, previous smoker, none currently.  Denies any illicit/IV drug use.  He does live at home and is cared for  by her by his daughter primarily.   FAMILY HISTORY:  A brother died at age 37 of rheumatic heart disease.  Father died at age 18 to a myocardial infarction.   DRUG ALLERGIES:  None known.   MEDICATIONS:  Again not currently compliant.  1. K-Dur 20 mEq p.o. daily.  2. Vicodin 5/500 one to two p.o. q.4-6 h. p.r.n.  3. Furosemide 40 mg p.o. b.i.d.  4. Acetazolamide 250 mg p.o. b.i.d.  5. Warfarin, current dose 5 mg every Monday and Friday, otherwise 2.5      mg.  6. Lisinopril 5 mg p.o. daily.  7. Digoxin 0.25 mg p.o. daily.  8. Aspirin 81 mg p.o. daily.  9. Crestor 20 mg one-half tablet daily.  10.Coreg 3.125 mg p.o. daily.  11.Zetia 10 mg p.o. daily.  12.Zofran 4 mg p.o. q.4 h. p.r.n.   REVIEW OF SYSTEMS:  Negative except for the nausea and vomiting and  right flank pain.   PHYSICAL EXAMINATION:  VITAL SIGNS:  Blood pressure is 138/78, pulse is  76 and paced, respiratory rate 18, temperature 97.5.  GENERAL:  This is a pleasant 65 year old  Caucasian man in no distress.  HEENT:  Unremarkable.  The head is atraumatic and normocephalic.  The  pupils are equal, round and reactive to light.  Sclerae are anicteric.  The oral mucosa is pink and moist.  Teeth and gums in poor repair.  NECK:  Supple without thyromegaly or masses.  The carotid upstrokes are  normal.  There is no bruit or JVD at 30 degrees inclination.  CHEST:  Clear bilaterally.  No wheezes, rales or rhonchi.  There is an  anterior midline sternotomy scar.  HEART:  Has a regular rhythm.  There is a either an opening snap or a  loud S3 present.  No systolic or diastolic murmur.  ABDOMEN:  Soft and  nontender with a midline abdominal vertical scar.  Bowel sounds present  in all quadrants.  EXTERNAL GENITALIA:  Without lesions.  Normal male phallus, descended  testicles.  RECTAL:  Not performed.  EXTREMITIES:  Show full range of motion.  No edema and intact distal  pulses.  SKIN:  Warm and dry with surgical scars as noted above.  NEUROLOGICAL:  Cranial nerves II-XII are intact.  Motor and sensory are  grossly intact.  Gait not tested.   ACCESSORY CLINICAL DATA:  Admission hemogram shows a hemoglobin of 11.2,  hematocrit 33.9, white blood cell count 10,200, platelet 253,000.  His  INR is 1.8.  The serum electrolytes were normal.  Creatinine 0.8,  glucose 93.   His electrocardiogram shows what I believe to be atrial sensing and  ventricular pacing, although the P waves are not very predominant or  very obvious.  A previous ECG dated November 2008 shows clear atrial and  ventricular pacing.   A chest x-ray is pending as is a 2-D echocardiogram currently being  obtained.   IMPRESSION:  1. Significant sized left atrial thrombus complicating mitral valve      placement.  2. Subtherapeutic systemic anticoagulation with warfarin.  3. Recent right retroperitoneal hematoma, iatrogenic.  4. Complex mitral valve disease as described above.  5. Mitral valve disease  complicated by multiple embolic episodes,      again as described above.  6. History of atrial flutter, status post ablation, with underlying      left bundle branch block, currently right ventricularly paced.  7. History of noncompliance.  Daughter states he has not been taking      his medications correctly for some time,  but she does state his      Coumadin has been appropriate.  Again, recent nausea and vomiting      have complicated this.  8. Hypertension.  9. Dyslipidemia.  10.Single-vessel coronary artery disease.  11.Predominantly nonischemic cardiomyopathy, left ventricular ejection      fraction 20% as of February 2008.   PLAN/RECOMMENDATIONS:  1. Agree with admission to hospital, clear liquids only to initiate      and hopefully will be able to progress diet.  2. Begin intravenous heparin per pharmacy, heparin level be maintained      between 0.3 and 0.7.  3. Continue current warfarin dose, again per pharmacy.  We will      increase to at least 5 mg p.o. daily for now, however  4. Plan for transesophageal echocardiogram in the near future.  5. Reinitiate medications as soon as possible as orally tolerated.  6. Progress medications as tolerated for cardiomyopathy to include ACE      inhibitor, carvedilol and spironolactone.   Thank you much for allowing me to assist in the care of [name].  It has been a pleasure to do so.  I will discuss his further  care with you.      [name], M.D.  Electronically Signed     JHE/MEDQ  D:  07/15/2007  T:  07/18/2007  Job:  [id number]   cc:   [name], M.D.

## 2010-10-21 NOTE — Discharge Summary (Signed)
NAMESEBASTHIAN, [name] NO.:  [id number]   MEDICAL RECORD NO.:  [id number]          PATIENT TYPE:  INP   LOCATION:  5155                         FACILITY:  [facility]   PHYSICIAN:  Francisca December, M.D.  DATE OF BIRTH:  Apr 17, 1946   DATE OF ADMISSION:  12/02/2007  DATE OF DISCHARGE:  12/09/2007                               DISCHARGE SUMMARY   DISCHARGE DIAGNOSES:  1. Chest wall/pocket hematoma  status implantable cardioverter-      defibrillator biventricular upgrade, evacuation of the hematoma.  2. MVR.  3. Left atrial mural thrombus.  4. Class III congestive heart failure.  5. Long-term Coumadin use.  6. Dilated cardiomyopathy.  7. History of atrial fibrillation.  8. Hypertension.   HOSPITAL COURSE:  [name] is a 65 year old male patient who was  recently admitted for bi-V upgrade of his  dual chamber pacemaker on  November 23, 2007.  He presented complaining of progressive swelling around  the pacer revision site over the past week.   Ultimately, he required a pocket hematoma evacuation and exploration  without clear bleeding source.  This took place on December 02, 2007.   Over the next several days, we watched this area closely and it improved  over time.   They also started his Coumadin back to try to get his INR back to the  therapeutic range.  At discharge, his INR was 1.4.  The pro time was  17.5.  Lab work includes a  BUN 15, creatinine 1.09, sodium 138, and  potassium 4.4.  Hemoglobin 11.2, hematocrit 34.8, white count 11.7, and  platelets 271.   He is discharged home in stable and improved condition.   MEDICATIONS:  1. Amiodarone 400 mg a day.  2. Coumadin 5 mg a day.  3. Potassium 20 mEq a day.  4. Lisinopril 5 mg a day.  5. Digoxin 0.25 mg a day.  6. Baby aspirin daily.  7. Crestor 20 mg a day.  8. Coreg 6.25 mg twice a day.  9. Zetia 10 mg a day.  10.Mucinex as needed.  11.Levaquin 500 mg a day.   The patient is to remain on a low-fat  diet.  No lifting over 10 pounds.  Increase activity as per wound care and activity instruction sheet.   FOLLOWUP:  Follow up with Dr. Amil Amen in a couple of weeks.  Appointment  will be called to the patient at the beginning of the week.  The patient  is to follow up in the Coumadin clinic on December 12, 2007 at 9 a.m.      Guy Franco, P.A.      Francisca December, M.D.  Electronically Signed    LB/MEDQ  D:  12/09/2007  T:  12/09/2007  Job:  [id number]   cc:   Francisca December, M.D.

## 2010-10-21 NOTE — Discharge Summary (Signed)
NAMECHRISTIAN, BORGERDING NO.:  [id number]   MEDICAL RECORD NO.:  [id number]          PATIENT TYPE:  INP   LOCATION:  5152                         FACILITY:  [facility]   PHYSICIAN:  Francisca December, M.D.  DATE OF BIRTH:  03-Aug-1945   DATE OF ADMISSION:  07/31/2007  DATE OF DISCHARGE:  08/06/2007                               DISCHARGE SUMMARY   DISCHARGE DIAGNOSES:  1. Left lower lobe pneumonia, treated with antibiotics.  2. Mild congestive heart failure, resolved.  3. Mild renal insufficiency, resolved.  4. Left atrial thrombus, known, now with a therapeutic INR.  5. Hyperlipidemia.  6. Coronary artery disease with ischemic cardiomyopathy, status post      stent placement to a circumflex.   [name] is a 65 year old male patient with a history of ischemic  cardiomyopathy as noted above.  He also had a mitral valve replacement  with a redo tissue valve secondary to thrombosis.  He was recent  hospitalized for CHF exacerbation and pneumonia.  His INR was found to  be sub-therapeutic and he was, by CT scan, found to have a left atrial  thrombus.  He is now admitted for volume overload and also he has been  found to have a pneumonia.   He has been treated aggressive with IV antibiotics for his pneumonia.  His Lasix has been given to him, except we did have to stop it because  of his renal insufficiency.  His creatinine increased to 1.63 and his  diuretics were stopped.  His BNP normalized to 40.  His blood pressure  had remained marginal and ranged from the 80s to lower 90s.   He is being discharged to home with close followup.  He needs to follow  up next week and I assume he will need to be back on his diuretic  therapy.  He will need a B-MET with this appointment.   His INR on discharge was 3.  Other lab studies show a hemoglobin of  11.3, hematocrit 33.4, white count 7, platelets 255.  Sodium 137,  potassium 3.7, discharge BUN 17, discharge creatinine 1.42.   DISCHARGE MEDICATIONS:  1. Avelox 400 mg one tablet a day until August 13, 2007.  2. Advair Diskus daily.  3. Baby aspirin 81 mg a day.  4. Vicodin p.r.n.  5. Zofran p.r.n.  6. Lisinopril 5 mg a day.  7. Coreg 3.125 mg.  He states he takes this once a day.  8. Digoxin 0.125 mg a day.  He was put on this dose in the hospital,      although it is unclear if he was on 0.125 mg a day or 0.25 mg a      day, either way he is to continue his home dose.  We can clarify      his medications in the office.  9. Crestor 20 mg one half tablet daily.  10.Zetia 10 mg a day.  11.Coumadin 2.5 mg a day.   For now his Lasix and acetazolamide are on hold.  He is to weight  himself daily.  If his weight  increased by 3 pounds over 1-2 days, he is  to take an extra dose of Lasix.   Our office will call with an appointment to be seen early next week.      Guy Franco, P.A.      Francisca December, M.D.  Electronically Signed    LB/MEDQ  D:  08/06/2007  T:  08/07/2007  Job:  [id number]
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
## 2010-10-21 NOTE — Discharge Summary (Signed)
[name], [name] NO.:  [id number]   MEDICAL RECORD NO.:  [id number]          PATIENT TYPE:  INP   LOCATION:  3705                         FACILITY:  [facility]   PHYSICIAN:  Francisca December, M.D.  DATE OF BIRTH:  06-May-1946   DATE OF ADMISSION:  11/23/2007  DATE OF DISCHARGE:  11/25/2007                               DISCHARGE SUMMARY   DISCHARGE DIAGNOSES:  1. Severe ischemic cardiomyopathy of 25%.  2. Chronic rapid ventricular pacing with a wide QRS, upgraded from a      dual-chamber to an implantable cardioverter-defibrillator      biventricular.  3. Chronic congestive heart disease, class III.  4. Met MADIT II criteria for implantable cardioverter-defibrillator      prophylaxis.  5. History of atrial fibrillation/flutter status post atrioventricular      nodal ablation by Dr. Lewayne Bunting with subsequent permanent      pacemaker insertion in the past.  6. Chronic left bundle branch block.  7. History of multiple embolic strokes, both cerebrovascular accident      and intra-abdominal.  8. History of left atrial thrombus documented by TEE in February 2009,      repeat TEE at this admission showed some resolution, see below.  9. Remote history of mitral valve replacement with Bjork-Shiley in      1982, then replaced in January 2008 with a tissue valve with      multiple episodes of emboli and clotting of the valve.  10.History of noncompliance with Coumadin.  11.Systemic anticoagulation.  12.Retroperitoneal hematoma in January 2009 at [facility]      reported secondary to heparin overdose.  13.Hyperlipidemia.  14.Coronary artery disease status post percutaneous coronary      intervention/stent implantation to the left circumflex.   HOSPITAL COURSE:  [name] is a 65 year old male, patient of Dr.  Amil Amen, who has the above cardiac problems.  He was admitted to the  hospital on November 23, 2007, for an upgrade of permanent pacemaker, dual-  chamber,  to an ICD BiV.  This went well.  The patient went into sinus  rhythm after defibrillation.  We reinitiated atrial fib and flutter due  to history of extensive left atrial thrombus.  We, of course, wanted [name] out of atrial fibrillation back in sinus rhythm; however, we  felt it was necessary to perform a TEE prior to cardioverting him back  to sinus rhythm.  The TEE showed no mobile thrombus including in the  atrial; appenadage. there was extensive mural thrombus on the posterior  wal of the LA, stable since last TEE in Feb 09.After some deliberation,  he was cardioverted successfully back to normal sinus rhythm/a paced.  He remained on amiodarone the entire time.  By November 25, 2007, we felt he  was safe to go home.  At this time, an amiodarone level is pending and  this will need to be checked next week at his office visit.  His INR on  discharge is 1.9 and hemoglobin 12.6.  His BMET is essentially normal.   DISCHARGE MEDICATIONS:  1. Amiodarone  400 mg a day.  2. Coumadin 5 mg a day.  3. Potassium 20 mEq a day.  4. Lisinopril 5 mg a day.  5. Digoxin 0.25 mg once a day.  6. Baby aspirin 81 mg a day.  7. Crestor 20 mg a day.  8. Coreg 6.25 mg twice a day.  9. Zetia 10 mg a day.  10.Mucinex daily as needed.  11.Levaquin 500 mg a day.   He is to remain on a low-sodium, heart-healthy diet.  Activity and wound  care as per instruction sheet.  I have given him a prescription for  Vicodin as needed for pain.  He is to follow up with Dr. Laureen Abrahams, his nurse practitioner, as well as a Coumadin check on December 01, 2007, at 10:45 a.m.Guy Franco, P.A.      Francisca December, M.D.  Electronically Signed    LB/MEDQ  D:  11/25/2007  T:  11/25/2007  Job:  [id number]   cc:   Francisca December, M.D.

## 2010-10-21 NOTE — Consult Note (Signed)
NAME:  [name], CUTRONE NO.:  [id number]   MEDICAL RECORD NO.:  [id number]          PATIENT TYPE:  EMS   LOCATION:  ED                           FACILITY:  [facility]   PHYSICIAN:  Corinna L. Lendell Caprice, MDDATE OF BIRTH:  1945-11-05   DATE OF CONSULTATION:  10/25/2006  DATE OF DISCHARGE:  10/25/2006                                 CONSULTATION   CONSULTING PHYSICIAN:  Shelda Jakes, MD.   REASON FOR CONSULTATION:  Shortness of breath, evaluate for admission.   IMPRESSION/RECOMMENDATIONS:  1. Dyspnea:  The patient's history is quite vague.  He, however, feels      better after getting IV Lasix, and his BNP is below 1000.  He does      have a severe cardiomyopathy but when I entered the room, he was      lying completely supine and comfortable.  Also I have asked the      tech to ambulate the patient around the emergency room and he      reports that although he feels weak, his shortness of breath has      improved.  I do not think that he requires admission at this time,      but I have asked that he call his cardiologist in the morning to      set up an appointment as soon as possible to review his medications      and adjust as needed.  2. Ischemic cardiomyopathy with an ejection fraction of 25%.  3. Status post mild mitral valve replacement with Bjork-Shiley mitral      valve in 1982.  4. History of multiple cerebral emboli due to noncompliance with      Coumadin.  5. Coronary artery disease with occluded right coronary and previous      stent in the circumflex.  6. Recurrent atrial fibrillation and flutter.  7. Status post atrioventricular nodal ablation with pacemaker.  8. 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  9. Hypertension.  10.Hyperlipidemia.  11.History of embolectomy of his extremities in the past due to      noncompliance with Coumadin.  12.Previous history of alcohol abuse.  Reports he is currently      abstaining from alcohol.   HISTORY OF PRESENT ILLNESS:  [name] is a 65 year old white male  with multiple medical problems, who had in the past been seeing Dr.  Donnie Aho for his heart problems as listed above.  He has had multiple  admissions to the hospital.  He currently is seeing a cardiologist in  [place].  He had some nausea today and shortness of breath while  mowing his lawn.  He called his primary care physician at [facility], which I believe is Dr. Particia Jasper.  Apparently there were no  available appointments so he was instructed to present to the emergency  room.  The patient has received IV Lasix and has already put out nearly  2 L of urine, and he feels better.  He reports compliance with his  medications and low-salt diet.  He has no chest  pain.  He has been  evaluated by the ED physician, who apparently was going to send the  patient home after IV Lasix but the patient reported that he is usually  admitted to the hospital when he feels like this;  however, at this  time, after a significant diuresis, he reports that he feels well enough  to go home.  He denies a recent weight gain.  Denies edema.   PAST MEDICAL HISTORY:  As above.   MEDICATIONS:  1. Aspirin 81 mg a day.  2. Coreg 6.25 mg twice a day.  3. Coumadin 2.5 mg a day.  4. Crestor 20 mg a day.  5. Lasix 40 mg a day.  6. Zetia 10 mg a day.  7. Amiodarone.  8. Digoxin.  9. Potassium chloride 20 mEq a day.  10.Lisinopril 20 mg a day.   SOCIAL HISTORY:  The patient used to drink alcohol to excess but reports  abstinence currently.  He is a previous smoker.   FAMILY HISTORY:  Noncontributory.   REVIEW OF SYSTEMS:  As above, otherwise negative.   PHYSICAL EXAMINATION:  VITAL SIGNS:  His temperature is 97.1, blood  pressure 151/79 pulse 77, respiratory rate 20, oxygen saturation  initially 91% on room air, currently 98%.  GENERAL:  The patient is a thin white male lying supine on the gurney,  in no acute distress.   HEENT:  Normocephalic, atraumatic.  Pupils equal, round, reactive to  light.  Sclerae are nonicteric.  Moist mucous membranes.  NECK:  Supple, no JVD.  No thyromegaly.  LUNGS:  He has rales at the right base.  ABDOMEN:  Soft, nontender, nondistended.  GENITOURINARY:  Deferred.  RECTAL:  Deferred.  EXTREMITIES:  No clubbing, cyanosis or edema.  Pedal pulses are  diminished.  NEUROLOGIC:  Alert and oriented.  Cranial nerves and sensorimotor exam  are grossly intact.  PSYCHIATRIC:  Calm and cooperative.  SKIN:  No rash.   LABORATORY DATA:  White blood cell count 13,000, hemoglobin 12.1,  hematocrit 37.2, platelet count 253.  INR 1.4.  D-dimer 1.1.  Basic  metabolic panel unremarkable.  Point care enzymes negative.  The B-type  natriuretic peptide is not showing in the computer but I called the lab  and the tech told me that it was just over 600.  EKG shows atrial  fibrillation with a paced rhythm.  Chest x-ray shows chronic bronchial  thickening, atelectasis and pleuroparenchymal scarring, cardiac  enlargement with vascular congestion, chronically elevated right  hemidiaphragm.  CT angiogram of the chest shows no PE, compatible with  pulmonary edema, mediastinal bihilar adenopathy   ASSESSMENT/PLAN:  As above.      Corinna L. Lendell Caprice, MD  Electronically Signed    CLS/MEDQ  D:  10/25/2006  T:  10/26/2006  Job:  [id number]   cc:   Dr. Bufford Spikes [facility]

## 2010-10-21 NOTE — Op Note (Signed)
NAMEZEEV, Dominic NO.:  [id number]   MEDICAL RECORD NO.:  [id number]          PATIENT TYPE:  INP   LOCATION:  3705                         FACILITY:  [facility]   PHYSICIAN:  Francisca December, M.D.  DATE OF BIRTH:  [birth year]   DATE OF PROCEDURE:  11/23/2007  DATE OF DISCHARGE:                               OPERATIVE REPORT   PROCEDURES PERFORMED:  1. Upgrade dual-chamber pacing device to implantable cardioverter-      defibrillator biventricular device.  2. Left subclavian venogram.  3. Defibrillation threshold testing.   INDICATIONS:  [name] is a 65 year old male with a long  history of class III heart failure, chronic systolic LVEF 25%, mitral  valve replacement x2, and atrial fib flutter status post AV nodal  ablation with placement of permanent pacemaker in 2006.  He is brought  to catheterization laboratory at this time for an upgrade of the device  to ICD biV for prophylactic purposes under the MADIT II criteria and for  treatment of his heart failure with biventricular pacing.   At the time of his more recent mitral valve replacement in January 2008,  LV epicardial leads were placed with leads in the subcutaneous left  upper chest.  These will be dissected and used for his device.  Also, he  has had multiple embolic strokes and at the time I assuming his care in  February 2009, he underwent TEE which showed extensive left atrial  thrombus.  He has been adequately treated with warfarin and his last INR  was therapeutic at 2.5 two days ago.  We are attempting to keep him in  the 2.5-3.5 range.   PROCEDURE NOTE:  The patient was brought to cardiac catheterization  laboratory in fasting state.  The left prepectoral region was prepped  and draped in the usual sterile fashion.  Local anesthesia was obtained  with the infiltration of 1% lidocaine with throughout the left pectoral  region.  A 7-8-cm incision was made over the old incision site.   This  was carried down by sharp dissection with electrocautery to the  pacemaker capsule.  I then dissected the tips of the previously placed  two left ventricular leads.  The caps were removed and pacing parameters  were obtained.  This is reported below.  Each lead had a LV threshold of  1.5 V.  An attempt was made to identify which the lead was the lower and  more lateral on the LV.  This was done with timing intervals with pacing  at LV and sensing in the RV.  I was unable to discern the difference.  At this point, a left subclavian puncture was performed using an 18-  gauge thin-wall needle through which was passed a 0.035-inch Wholey  wire.  This was followed by a 9-1/2-French sheath and dilator.  The  dilator was removed as well as the wire and the RV lead was advanced to  the level of the right atrium.  The sheath was torn away.  An 0 silk  figure-of-eight hemostasis suture was placed around the lead at the  entry site.  Using standard technique and fluoroscopic landmarks, the  lead was manipulated into the right ventricular apex.  This was an  active fixation lead and the screw was advanced as appropriate.  It was  then tested for adequate pacing parameters and this is reported below.  There was an excellent injury current present.  The lead was sutured  into place using three separate 0 silk ligatures.  Following this, the  pacemaker was incised and the device was delivered without difficulty.  The leads were detached from the pacing generator while pacing via the  cables on the LV.  The pocket was revised using blunt dissection  electrocautery at low power.  The leads were freed from the subcutaneous  tissue.  The pace shock device was then attached to these leads  carefully identifying each by its serial number and placing each into  the appropriate receptacle.  Each lead was tightened into place and  tested for security.  The pocket was then copiously irrigated using 1%   kanamycin solution.  The pace shock device was then placed in the pocket  and the pocket was closed using 2-0 Vicryl in a running fashion for the  subcutaneous layer.  Two layers were applied.  The skin was approximated  using 4-0 Vicryl in a running subcuticular fashion.   After establishing adequate moderate sedation with Versed and fentanyl,  the patient underwent a defibrillation threshold testing.  Ventricular  fibrillation was induced by the shock-on-T technique.  Ventricular  fibrillation was promptly induced detected in the device charge  delivering a 20-J shock and prompt return of sinus rhythm was noted.   Due to the unknown condition of his left atrium and whether there is  still thrombus present, atrial fibrillation was re-induced by rapid  atrial pacing via the device.   The patient was then slowly awakened from his sedation.  Romazicon 0.2  mg were administered.  He was transported to recovery area in stable  condition.   EQUIPMENT DATA:  The explanted pacemaker is a Medtronic EnRhythm, model  D1939726, serial [id number].  The RV pace shock lead is a Medtronic  [employer], model C9725089, serial [id number].  The pace shock  device is a Medtronic [place], model [id number], serial [id number].   PACING DATA:  The atrial lead detected at 1.6 mV flutter wave.  The  pacing impedance was 536 ohms at 5 V AOO.  The RV lead detected an 11.8  mV R-wave.  The pacing threshold was 1.5 V at 0.5 milliseconds pulse  width.  The impedance was 1089 ohms resulting in a current at capture  threshold of 1.7 MA.  The left ventricular lead, serial number ending in  990V detected a greater than 30mV R wave.  The pacing threshold was 1.5  V at 0.5 milliseconds pulse width.  The impedance was 545 ohms resulting  in a current at capture threshold of 3.5 MA.   Defibrillation threshold testing was successful as mentioned above.  The  shock impedance was 41 ohms.  The energy delivered  with 20 J and the  duration of a ventricular fibrillation from induction to treatment was 8  seconds.   I note from reviewing his cardiac compass from [facility] pacemaker that  he has been in atrial fibrillation since November 07, 2007.  From late  February to late May, he was predominantly in a sinus rhythm.      Francisca December, M.D.  Electronically Signed  JHE/MEDQ  D:  11/23/2007  T:  11/24/2007  Job:  [id number]

## 2010-10-21 NOTE — Consult Note (Signed)
[name], NEEDS NO.:  [id number]   MEDICAL RECORD NO.:  [id number]          PATIENT TYPE:  INP   LOCATION:  2013                         FACILITY:  [facility]   PHYSICIAN:  Francisca December, M.D.  DATE OF BIRTH:  July 31, 1945   DATE OF CONSULTATION:  02/24/2008  DATE OF DISCHARGE:                                 CONSULTATION   REQUESTING PHYSICIAN:  Ramiro Harvest, MD   REASON FOR CONSULTATION:  CHF exacerbation.   Problems extensive cardiac to include,  1. Ischemic and nonischemic severe cardiomyopathy.  2. Status post recent upgrade permanent pacemaker to implantable      cardioverter-defibrillator by the defibrillator and pacemaker.  3. Chronic congestive heart failure class III improved to class II      following above.  4. Atrial fibrillation/flutter, status post atrioventricular nodal      ablation subsequent pulse per minute.  5. Status post cardioversion sinus rhythm with insertion implantable      cardioverter-defibrillator June 2009.  6. Chronic systemic anticoagulation.  7. History of multiple embolic events, both cerebrovascular and intra-      abdominal.  8. Known left atrial thrombus, organized by transesophageal      echocardiography February and June 2009.  9. Chronic amiodarone therapy.  10.History of remote Bjork-Shiley mitral valve replacement with redo      January 2008 tissue valve.  11.History of noncompliance with Coumadin.  12.Hyperlipidemia.  13.Coronary artery disease status post percutaneous coronary      intervention/stent implantation left circumflex, ? 2005.   FINDINGS:  [name] is a 65 year old man with the above-noted chronic  medical problems and complicated cardiac history who was in his usual  state of improved health following the insertion of his ICD BiV in June  2009.  He reports an improvement to what sounds like class II heart  failure from chronic class III.  He is up and ambulating about the  house, engages  in multiple activities including some light yard work.  No symptoms with this.  However, about 24 hours prior to admission he  had the abrupt onset of dizziness and apparently some slurred speech.  He had a severe right parietal headache with this and then suddenly had  an associated severe dyspnea at rest.  He called 911 and reported to  emergency room.  He denies that this was chest discomfort or pressure.  No heaviness.  In the emergency room, he was noted to have an INR that  was subtherapeutic and elevated BNP at 710.  His chest x-ray showed  diffuse interstitial markings and he was treated with IV furosemide 40  mg.  He has been urinating frequently since then, although his INR was  incomplete.  No recording of this output in the emergency room.   He notes his last BNP approximately 2 weeks ago was 2.8 in our office.  He is confused as to how it might be lower at this point.  The person  who is living with him at home states he does not overeat dark green  vegetables.  He states he takes his  Coumadin on a regular basis.   Medications as an outpatient,  1. Amiodarone 200 mg p.o. b.i.d.  2. Currently Coumadin 4 mg daily.  3. Digoxin 0.25 mg daily.  4. Lisinopril 5 mg p.o. ? daily or b.i.d.  5. Potassium 20 mEq p.o. daily.  6. Aspirin 81 mg p.o. daily.  7. Zetia 10 mg p.o. daily.  8. Crestor 20 mg p.o. daily.  9. Carvedilol 6.25 mg p.o. b.i.d.   His discharge medication list as documented November 25, 2007 notes his  lisinopril dose should be 5 mg daily, again he was not discharged on any  diuretic and continues to take daily potassium.   DRUG ALLERGIES:  None known.   SOCIAL HISTORY:  The patient is a former smoker, currently lives  independently.  May drink up to 6 beers on the weekend.  Lives in his  own home without assistance.   REVIEW OF SYSTEMS:  The patient denies any ongoing fever, chills, cough,  or hemoptysis.  No excessive bleeding or easy bruising.  He has not had   any melena or hematochezia.  He has not had any lower extremity edema.  No localized left or right-sided weakness.  No numbness or tingling.  He  has not had any excessive weight gain or weight loss.   PHYSICAL EXAMINATION:  VITAL SIGNS:  The blood pressure is 100/60, pulse  is 60 and regular paced RV and LV, respiratory rate 18, temperature  afebrile, and O2 saturation on room air 95%.  GENERAL:  The patient is an alert, oriented, thin 65 year old Caucasian  male in no distress.  HEENT:  Unremarkable.  The head is atraumatic and normocephalic.  Pupils  are equal, round, and reactive to light.  Extraocular movements are  intact.  Sclerae are anicteric.  Oral mucosa is pink and moist.  Tongue  is not coated.  NECK:  Supple.  No thyromegaly or masses.  The carotid upstrokes are  normal.  No bruit.  No JVD.  CHEST:  Clear with adequate excursion bilaterally.  No wheezes, rales,  or rhonchi.  HEART:  Regular rhythm, slightly accentuated S1, normal S2, no murmur,  no gallop.  ABDOMEN:  Soft and nontender.  No midline pulsatile mass.  EXTERNAL GENITALIA:  Normal male phallus, descended testicles.  No  lesion.  RECTAL:  Not performed.  EXTREMITIES:  Full range of motion.  No edema.  I could not feel  posterior tibial pulses.  NEUROLOGIC:  Nonfocal.  SKIN:  Warm, dry, and clear.   ACCESSORY CLINICAL DATA:  Hemoglobin 11.6, hematocrit 35.2, white blood  cell count 7400, platelets 167,000.  INR 1.3.  Serum electrolytes, BUN,  creatinine, and glucose all within normal limits.  BNP 710.  TSH 1.345.   Electrocardiogram shows what appears to be slow atrial flutter or  perhaps sinus rhythm with a long AV delay.  His predischarge ECG in June  when known to be in sinus rhythm did not show any significant T-waves.  He was atrial paced at that time.  I do not see atrial pacing spikes on  this tracing.   IMPRESSION:  1. Recent congestive heart failure exacerbation, no clear etiology.  2. Likely  recurrent atrial fibrillation/flutter despite relatively      high-dose amiodarone.  3. Recent likely transient ischemic attack, probably embolic.  4. Subtherapeutic international normalized ratio.  5. Known organized left atrial thrombus.  6. Improved functional status, status post implantable cardioverter-      defibrillator biventricular upgrade, June 2009.  7. Coronary artery disease status post left circumflex stent.  8. History of multiple embolic events, both cerebrovascular and      abdominal.  9. Retroperitoneal hematoma January 2009, [facility], heparin      overdose.  10.Hyperlipidemia.  11.Chronic compensated systolic heart failure with recent      exacerbation.   PLAN/RECOMMENDATIONS:  1. Decreased amiodarone to 200 mg p.o. daily pending ICD      interrogation.  If this documents recurrent AFib/flutter, then      amiodarone should likely be discontinued and the patient will be      chronically RV-LV paced only.  2. Continue furosemide 40 mg p.o. b.i.d. x1 more day, then 40 mg p.o.      daily.  3. Continue IV heparin until INR therapeutic.  4. Suggest repeat echocardiogram to reassess LV function if still      hospitalized February 27, 2008.  5. Dr. Pearlean Brownie of the Neurology Service is recommending carotid      Dopplers.  I do not feel this is unreasonable.  6. Recommend optimize LDL control at 70.  7. The patient has followup scheduled with my office next week.  I      would ask him to keep that it discharged by Monday February 27, 2008.  We will not plan on obtaining device interrogation unless      remains in the hospital in the next week.  He is scheduled for this      to be done in the office as well next week.      Francisca December, M.D.  Electronically Signed     JHE/MEDQ  D:  02/24/2008  T:  02/25/2008  Job:  [id number]   cc:   Lavonda Jumbo, M.D.  Ramiro Harvest, MD

## 2010-10-21 NOTE — Discharge Summary (Signed)
NAMEJULIO, ZAPPIA NO.:  [id number]   MEDICAL RECORD NO.:  [id number]          PATIENT TYPE:  INP   LOCATION:  4735                         FACILITY:  [facility]   PHYSICIAN:  Jake Bathe, MD      DATE OF BIRTH:  Oct 20, 1945   DATE OF ADMISSION:  12/14/2008  DATE OF DISCHARGE:  12/17/2008                               DISCHARGE SUMMARY   DISCHARGE DIAGNOSES:  1. Acute on chronic systolic heart failure, improved with diuresis.  2. Chronic kidney disease, creatinine on discharge 1.5.  3. Ischemic cardiomyopathy, ejection fraction around 25% status post      implantable cardioverter-defibrillator.  4. History of mitral valve replacement x2 initially with a Bjork-      Shiley valve then switched over to a tissue valve.  He has a      history of left atrial thrombus.  He has a history of atrial      fibrillation.  He has been on Coumadin in the past.  He has known      coronary artery disease.  He has a known occluded right coronary      artery with a stent of the circumflex documented in July 2008.  He      has had a right-sided transient ischemic attack, hypertension,      noncompliant pulmonary hypertension, former smoker.   HOSPITAL COURSE:  Kohle Winner is a 65 year old male patient with known  ischemic cardiomyopathy status post BiV AICD with a known chronically-  occluded right coronary artery who presented to the emergency room with  shortness of breath.  His BNP was 655, and his chest x-ray documented  CHF.  He was placed on oxygen and was given IV diuretics.  His BUN on  admission was 9 with a creatinine of 1.23.  With gradual diuresis, his  creatinine went up to 1.83.  When we set back on this, it started a  downward trend.  His creatinine on discharge is 1.5.   At this point, we placed him back on his regular 40 mg of Lasix p.o.  daily and are going to continue his other medications.  Our plan is to  check an echo as an outpatient.   DISCHARGE  MEDICATIONS:  1. Coreg 6.25 mg p.o. b.i.d.  2. Baby aspirin daily.  3. Crestor 20 mg a day.  4. Jantoven 5 mg a day.  5. Pacerone 200 mg 2 tablets daily.  6. Lasix 40 mg a day.  7. Lisinopril 10 mg a day.  8. Klor-Con 20 mEq daily.   He is to remain on a low-sodium, heart-healthy diabetic diet.  Increase  activity slowly.  Followup with Dr. Amil Amen on January 01, 2009, at 2:00  p.m. with an echo and then to follow up with Dr. Amil Amen in 2:45.      Guy Franco, P.A.      Jake Bathe, MD  Electronically Signed    LB/MEDQ  D:  12/17/2008  T:  12/17/2008  Job:  [id number]

## 2010-10-21 NOTE — Discharge Summary (Signed)
NAMETREVIONNE, ADVANI               ACCOUNT NO.:  [id number]   MEDICAL RECORD NO.:  [id number]          PATIENT TYPE:  INP   LOCATION:  4706                         FACILITY:  [facility]   PHYSICIAN:  Corinna L. Lendell Caprice, MDDATE OF BIRTH:  [birth year]   DATE OF ADMISSION:  07/15/2007  DATE OF DISCHARGE:  07/20/2007                               DISCHARGE SUMMARY   DISCHARGE DIAGNOSES:  1. Left atrial thrombus.  2. Recent right retroperitoneal hematoma.  3. Resolved vomiting.  4. Rheumatic heart disease with mitral valve replacement of Bjork-      Shiley in 1982 and redo in 2008 to tissue valve.  5. 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  6. History of atrial fibrillation flutter with ablation and pacemaker.  7. History of systolic dysfunction with ejection fraction 25%.  8. Cholelithiasis.  9. Coronary artery disease with stent..  10.Hyperlipidemia.  11.History of previous tobacco and alcohol abuse.  12.Increased liver function test, etiology unclear, statins stopped.   DISCHARGE MEDICATIONS:  1. Carvedilol was changed to 6.25 mg p.o. b.i.d.  2. Stop quinapril.  3. Resume amiodarone 200 mg a day.  4. Stop Crestor.  5. Continue Coumadin as previous.  6. Stop acetazolamide.  7. Stop Lisinopril.  8. Continue Lasix 40 mg p.o. b.i.d.  9. Stop Zetia.  10.Continue Lanoxin 0.25 mg a day.  11.Stop Quinapril.  12.Continue baby aspirin 81 mg a day.  13.Continue Klor-Con 20 mEq a day.  14.Vicodin as needed.  15.Advair, albuterol.   CONDITION:  Stable.   DISCHARGE INSTRUCTIONS:  1. Diet should be low-salt.  2. Follow up with Dr. Amil Amen on February 24 at 2:00 p.m.  3. [facility] up with [facility] on February 13.  4. Follow up with primary care physician at [facility] in 1-2      weeks.   CONSULTATIONS:  Dr. Amil Amen.   PROCEDURES:  Transesophageal echocardiogram which showed ejection  fraction 25%,  septal apical anterior akinesis, LV dilatation, moderate  to severe, normal function of the prosthetic mitral valve, large LA  thrombus involving two thirds of the atrium, aortic valve normal,  tricuspid valve normal, pulmonic valve not well visualized.   LABORATORY DATA:  CBC significant for a hemoglobin of 11.2, hematocrit  33.9 which remained stable, ESR 62, INR on admission 1.8, at discharge  is 2.0, complete metabolic panel significant for an albumin of 2.8, AST  212, ALT 250, alkaline phosphatase 130, amylase lipase normal, CPK 61,  MB 4.4, acute hepatitis panel negative, CRP 18, digoxin level less than  0.2, amiodarone level 0.49.   SPECIAL STUDIES AND RADIOLOGY:  EKG showed atrial fibrillation and  ventricular pacing.  Chest x-ray showed nothing acute, abdominal  ultrasound showed small gallstones, no cholecystitis, 17 cm cystic mass  within the right side of the abdomen as seen on prior CAT scan.   HISTORY AND HOSPITAL COURSE:  Mr. Meter is a 65 year old white male  who had been seen by a nurse practitioner at the [facility] at [facility]  office for abdominal  pain.  He was a difficult historian.  After getting  records from [facility] and talking to family members, the patient  had had a prolonged hospitalization at [facility] for respiratory  failure and pneumonia.  He had been given heparin and developed a  retroperitoneal hematoma there.  His Coumadin was held for several weeks  and he was recently restarted on Coumadin.  He has a history of atrial  fibrillation/flutter and prosthetic mitral valve.  The nurse  practitioner at [facility] at [facility] ordered an outpatient CAT scan  which showed a retroperitoneal mass and a left atrial thrombus.  The  patient was asked to come to the emergency room for further workup and  treatment.  Please see H&P for complete admission details.  The patient  had complaints of right-sided abdominal pain and vomiting.  He was   admitted and started on Coumadin.  His hemoglobin/hematocrit were  monitored.  He had seen several cardiologist in the past, but requested  to change to [facility].  They were consulted and did a  transesophageal echocardiogram.  Initially this patient was to have a  cardiac catheterization, but this was cancelled and may be done at some  point in the future.  His vomiting resolved.  No specific etiology was  found.  He was a tolerating a regular diet at the time of discharge.  His liver function tests were somewhat elevated on admission and his  statin was held.  He has a history of heavy drinking in the past but  vehemently denied current alcohol use.  An echocardiogram from [facility]  done about a month ago showed no thrombus, so he is felt to have  developed the thrombus in a interim during the time that he was off  Coumadin.  His blood pressure and hemoglobin remained stable on  anticoagulation and he was discharged home in stable condition.      Corinna L. Lendell Caprice, MD  Electronically Signed     CLS/MEDQ  D:  08/09/2007  T:  08/10/2007  Job:  [id number]   cc:   Lavonda Jumbo, M.D.  Francisca December, M.D.

## 2010-10-21 NOTE — Consult Note (Signed)
NAME:  [name], [name] NO.:  [id number]   MEDICAL RECORD NO.:  [id number]          PATIENT TYPE:  INP   LOCATION:  3735                         FACILITY:  [facility]   PHYSICIAN:  Ramiro Harvest, MD    DATE OF BIRTH:  1946/05/07   DATE OF CONSULTATION:  08/02/2007  DATE OF DISCHARGE:                                 CONSULTATION   INTERNAL MEDICINE CONSULTATION   REFERRING PHYSICIAN:  Francisca December, M.D.   REASON FOR CONSULTATION:  Questionable left lower lobe pneumonia per CT  scan.   HISTORY OF PRESENT ILLNESS:  [name] is a 65 year old male with  complicated past medical history of ischemic cardiomyopathy with mitral  valve replacement and re-do tissue valve, recent hospitalization in  July 15, 2007 for congestive heart failure exacerbation and  pneumonia, who was admitted on July 31, 2007 for a CHF exacerbation  and congestive hepatopathy with some nausea and emesis.  During the work  up and management of patient's hypertensive symptoms, CT scan of the  abdomen and pelvis was done and noted on CT scan was patchy airspace  disease in the left lower lobe suspicious for aspiration or infection.  Chest x-ray was done on August 02, 2007 with interval increasing focal  collapse/consolidation at the right lung base and interstitial pulmonary  pneumonia.  We are consulted for further evaluation and management of  probable pneumonia.  The patient admitted that he has been having cough,  emesis and nausea three days prior to admission and had cough productive  of scant clear sputum during the hospitalization.  The patient did have  also one episode of emesis yesterday on August 01, 2007.  The patient  does endorse some chills and subjective fevers prior to admission with  no other associated symptoms.   ALLERGIES:  No known drug allergies.   PAST MEDICAL HISTORY:  1. Rheumatic heart disease with mitral valve replacement initially      Bjork-Shiley  valve in 1982 which thrombosed and was converted to a      tissue valve in January, 2008 per Dr. Dorris Fetch.  2. Coronary artery disease status post PTCI to circumflex.  3. Ischemic cardiomyopathy with EF of 20%.  4. History of AV nodal ablation and pacemaker.  5. Recent retroperitoneal hematoma secondary to heparin.  6. 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  7. Hyperlipidemia.  8. Atrial fibrillation, atrial flutter.  9. Prior tobacco and alcohol abuse.   HOME MEDICATIONS:  1. Advair Diskus.  2. Aspirin 81 mg daily.  3. Vicodin 5/500 one to two tablets p.o. q.4h. p.r.n.  4. Lasix 40 mg b.i.d.  5. Acetazolamide 250 mg b.i.d.  6. Coumadin 2.5 mg daily, 5 mg on Monday and Friday.  7. Lisinopril 5 mg daily.  8. Digoxin 0.25 mg daily.  9. Crestor 10 mg daily.  10.Coreg 3.125 mg daily.  11.Zetia 10 mg daily.  12.Zofran 4 mg p.o. q.6h. p.r.n.   HOSPITAL MEDICATIONS:  1. Acetazolamide 250 mg p.o. b.i.d.  2. Aspirin 81 mg daily.  3. Coreg 3.125 mg daily.  4. Digoxin 0.125 mg daily.  5. Advair one puff b.i.d.  6. Lasix 40 mg IV b.i.d.  7. Heparin GTT IV daily.  8. Lisinopril 5 mg daily.  9. Coumadin per pharmacy.   SOCIAL HISTORY:  The patient lives in [place] with his daughter.  No  tobacco, no alcohol, no IV drug use.  The patient quit tobacco and  alcohol use two years ago.   FAMILY HISTORY:  Mother deceased from CVA.  Father deceased age 45 from  acute MI.  Brother deceased age 36 from rheumatic heart disease and did  have some heart problems.   REVIEW OF SYSTEMS:  Per HPI, otherwise weakness with some nausea, some  shortness of breath.   PHYSICAL EXAMINATION:  VITAL SIGNS:  Temperature 97.8.  Pulse 73.  Blood  pressure 83/54 to 90/50.  Sating 90% on 2L nasal cannula.  Respiratory  rate 18.  GENERAL:  Patient is lying in bed in no apparent distress with  occasional cough.  HEENT:  Normocephalic,  atraumatic. Pupils equal, round, reactive to  light.  Extraocular movements intact. Oropharynx is clear, most, no  lesions.  NECK:  Supple, no lymphadenopathy.  RESPIRATORY:  Coarse breath sounds throughout lung fields R > L.  CARDIOVASCULAR:  Regular rate and rhythm with 2/6 systolic ejection  murmur.  ABDOMEN:  Soft, nontender, nondistended.  Positive bowel sounds.  EXTREMITIES:  No cyanosis, clubbing or edema.  NEUROLOGICAL:  Patient is alert and oriented x3.  Cranial nerves II-XII  are grossly intact.  No focal deficits.  Gait is not tested.   LABORATORY DATA:  PTT 179, PT 15.6, INR 1.2.  CBC - white count 7.6,  hemoglobin 11.9, platelet count 225,000, hematocrit 35.5.  Lipase 26,  amylase 50.  Blood cultures pending from July 31, 2007.  Sodium 136,  potassium 3.7, chloride 99, bicarb 27, BUN 13, creatinine 1.23, glucose  97.  Calcium 8.4.  Bilirubin 1.1, alkaline phosphatase 98, AST 88, ALT  220, protein 6.7, albumin of 2.9.  Chest x-ray from August 02, 2007  showed interval increasing focal collapse/consolidation of right lung  base, interstitial pulmonary edema.  CT scan of the abdomen and pelvis  done on August 01, 2007 shows interval decreasing right  retroperitoneal  fluid collection, patchy airspace disease in the left  lower lobe suspicious for aspiration or infection.  Interval decreasing  right iliacus fluid collection.  No new acute interval findings.   ASSESSMENT AND PLAN:  [name] is a 65 year old gentleman with  complex multiple cardiac problems who has presented to the hospital with  congestive heart failure exacerbation, also with some nausea and some  emesis.  Chest x-ray and CT scan of the abdomen and pelvis are  consistent with probable pneumonia.   Pneumonia:  Per Chest x-ray and CT scan of the abdomen likely a hospital-  acquired pneumonia versus an aspiration pneumonia.  The patient was  recently discharged from the hospital.  Will check a  sputum Gram stain  and culture.  Will check blood cultures x2.  Check a UA with culture and  sensitivity.  Check a urine Pneumococcus antigen, check a urine  Legionella antigen and patient is currently afebrile with a normal white  count.  Will treat empirically with vancomycin and Zosyn and tailor  antibiotics according to cultures.   It has been a pleasure taking care of [name].      Ramiro Harvest, MD  Electronically Signed  DT/MEDQ  D:  08/02/2007  T:  08/03/2007  Job:  [id number]

## 2010-10-21 NOTE — H&P (Signed)
NAMEDEVEAN, SKOCZYLAS               ACCOUNT NO.:  [id number]   MEDICAL RECORD NO.:  [id number]          PATIENT TYPE:  INP   LOCATION:  4706                         FACILITY:  [facility]   PHYSICIAN:  Corinna L. Lendell Caprice, MDDATE OF BIRTH:  Jun 26, 1945   DATE OF ADMISSION:  07/15/2007  DATE OF DISCHARGE:                              HISTORY & PHYSICAL   CHIEF COMPLAINT:  Abnormal CAT scan.   HISTORY OF PRESENT ILLNESS:  Mr. Pevehouse is an extremely medically  complicated 65 year old white male who was sent to the emergency room  after having had a CT of the abdomen and pelvis as an outpatient.  He  went to his primary care physician who ordered it for abdominal pain,  nausea, and vomiting, and Dr. Molli Posey with whom I have spoken called Dr.  Clovis Riley who was on call for Dr. Lynelle Doctor regarding the abnormal findings.  He was found to have a large left atrial clot and had a large cystic  retroperitoneal mass on the right measuring 18 x 8 x 10 cm contiguous  with and deforming the right psoas muscle.  Also, a second cystic lesion  in the right iliacus muscle measuring 7 x 2.5 x 4 cm.  The patient  reports that his right side has been hurting since discharge from  [facility] on June 17, 2007.  He and his daughter are unaware of  the particulars of his hospitalization during that time, but he was on a  ventilator for pneumonia and respiratory failure.  After reviewing faxed  records from [facility], I have since found out that he had a  retroperitoneal hematoma there and apparently may have received a 25,000  unit bolus of heparin according to the vascular surgeons consult note.  He had been off his Coumadin at discharge from [facility], and it  was resumed about a week ago.  There is an echocardiogram from December  at [facility] which makes no mention of atrial clot.  His ejection  fraction at that time was 20%.  The patient vomited last night but has  no complaints other  than the pain that he has been experiencing for over  a month now.  He has seen multiple cardiologists in the past, and his  daughter requests that he transfer care to [facility] cardiology.  He has had  no stroke-type symptoms.  He has had multiple cerebral emboli in the  past but has no residual neurologic deficits.   PAST MEDICAL HISTORY:  1. Rheumatic heart disease with mitral valve replacement initially      with a Bjork-Shiley valve in 1982 which was thrombosed and      converted to a tissue valve by Dr. Dorris Fetch in January of 2008.  2. Coronary artery disease with history of stent.  3. Ischemic cardiomyopathy, ejection fraction 20%.  4. History of ablation and pacemaker.  5. Retroperitoneal hematoma, as above.  6. History of multiple cerebral, mesenteric, and peripheral emboli and      procedures for same.  7. Atrial fibrillation flutter.  8. Hyperlipidemia  9. Previous smoker and heavy drinker, currently  abstaining from both.      He denies any drug use.   MEDICATIONS:  Medications are not clear.  According to the followup  clinic note from [facility], he has been on:  1. Digoxin 0.25 mg a day.  2. His Coumadin was recently resumed.  3. Zetia 10 mg a day.  4. Protonix 40 mg a day.  5. Amiodarone 200 mg a day.  6. Xopenex as needed.  7. Advair 250/50 b.i.d.  8. Lisinopril 5 mg a day.  9. Lasix 40 mg twice a day.  10.Digoxin 0.25 mg a day.  11.Crestor 20 mg a day.  12.KCl 20 mEq p.o. b.i.d.   ALLERGIES:  NO KNOWN DRUG ALLERGIES is   SOCIAL HISTORY:  As above.  He has a full code.  He is here with his  daughter.   FAMILY HISTORY:  His brother had heart problems.  His father had  coronary artery disease.  His mother died of a stroke.   REVIEW OF SYSTEMS:  CONSTITUTIONAL:  No fevers, chills, weight gain, or  weight loss.  HEENT:  No headache.  No dysphasia.  No sinus drainage.  RESPIRATORY:  No cough or shortness of breath.  CARDIOVASCULAR:  No  chest pains or  palpitations.  GI:  No asthmatic hematemesis.  No melena.  He complains of frequent constipation; otherwise, as above.  MUSCULOSKELETAL:  No myalgias, arthralgias.  HEMATOLOGIC:  As above.  ENDOCRINE:  No diabetes.  PSYCHIATRIC:  No depression.  NEUROLOGIC:  As  above.  SKIN:  No rash.  GU:  According to records, he had a urethral  stricture and required dilatation for Foley placement during his last  hospitalization.   PHYSICAL EXAMINATION:  VITAL SIGNS:  Please see nursing notes for vital  signs.  GENERAL:  The patient is a thin white male in no acute distress.  HEENT:  Normocephalic, atraumatic.  Pupils equal, round, and reactive to  light.  Sclerae nonicteric.  He has moist mucous membranes.  Oropharynx  is without erythema or exudate.  He has upper and lower dentures.  NECK:  Supple.  No carotid bruits.  No thyromegaly, no JVD.  LUNGS:  Clear to auscultation bilaterally without wheezes, rhonchi, or  rales.  CARDIOVASCULAR:  Regular rate and rhythm without murmurs, gallops, or  rubs.  ABDOMEN:  Somewhat protuberant.  Soft, nontender, no mass.  GU:  Normal genitalia.  No groin hematomas.  BACK:  No CVA tenderness.  No ecchymoses.  RECTAL:  Deferred.  EXTREMITIES:  He has 1+ edema bilaterally.  Pulses are intact.  NEUROLOGIC:  The patient is alert and oriented.  Cranial nerves and  sensory motor exam are intact.  PSYCHIATRIC:  Calm, cooperative.  Normal affect.  SKIN:  No bruising.  No rash.   LABORATORY DATA:  INR is 1.8.  CBC significant for hemoglobin of 11.2,  hematocrit 33.9.  Basic metabolic panel unremarkable.   EKG shows atrial fibrillation and ventricular pacing.  CT of the abdomen  and pelvis reviewed and as above.   ASSESSMENT AND PLAN:  1. Left atrial thrombus secondary to being off Coumadin.  I will give      heparin for now.  I have ordered an echocardiogram and ask that      [facility] cardiology read it and consult for any further      recommendations.  I will  continue Coumadin.  2. Retroperitoneal hematoma, present during last hospitalization at      [facility].  Certainly we  will need to monitor his      hemoglobin/hematocrit while being anticoagulated.  At this point,      he has no signs of infected hematoma and I will not percutaneously      drain this.  I have spoken with Dr. Molli Posey, and he agrees that it      could be consistent with old hematoma and that if needed, it would      be amenable to percutaneous drainage.  3. Coronary artery disease with ischemic cardiomyopathy, compensated.      No signs of congestive heart failure.  4. Atrial fibrillation flutter with ablation and pacemaker.  Continue      cardiac medications.  I have spoken with a nurse at the [place] at      [facility] who will fax over problem list, medication list, etc.  5. History of previous tobacco abuse.  Continue Advair and Xopenex.  I      suspect he has chronic obstructive pulmonary disease but certainly      is not wheezing currently nor is he short of breath.  6. Hyperlipidemia.  Continue outpatient medications.  7. Ischemic cardiomyopathy.  See above.  Continue outpatient      medications.  8. Rheumatic heart disease with mitral valve replacement with tissue      valve.  9. History of multiple cerebral, mesenteric, and peripheral emboli.  10.Previous heavy drinker.  81.XBJYNWGNF pain, certainly from hematoma.  This will be treated      symptomatically.  With respect to the nausea, he is not nauseated      now, but I will check an amylase, lipase, and a urinalysis.  I will      also check PA and lateral chest x-ray.   Total time spent is 120 minutes.      Corinna L. Lendell Caprice, MD  Electronically Signed     CLS/MEDQ  D:  07/15/2007  T:  07/17/2007  Job:  [id number]   cc:   Lavonda Jumbo, M.D.  Francisca December, M.D.

## 2010-10-21 NOTE — Cardiovascular Report (Signed)
NAME:  [name], CREIGHTON NO.:  [id number]   MEDICAL RECORD NO.:  [id number]          PATIENT TYPE:  INP   LOCATION:  5155                         FACILITY:  [facility]   PHYSICIAN:  Francisca December, M.D.  DATE OF BIRTH:  Mar 30, 1946   DATE OF PROCEDURE:  DATE OF DISCHARGE:                            CARDIAC CATHETERIZATION   PROCEDURE PERFORMED:  Pocket hematoma evacuation and exploration.   INDICATIONS:  [name] is a 65 year old man with severe nonischemic  cardiomyopathy and mitral valve tissue-type replacement.  He has atrial  fibrillation as well.  He underwent an upgrade of his pacemaker device  to an ICD biventricular pacemaker on November 23, 2007.  He was of course  chronically anticoagulated and was reinitiated on his Coumadin.  His  recent INRs have been in the 2.5-4 range.  He has developed an enlarging  pocket hematoma.  He has not had fever or chills.  No drainage.  He is  brought to the catheterization laboratory at this time for evacuation of  the same.   PROCEDURE NOTE:  The patient is brought to cardiac catheterization  laboratory in the fasting state.  The left prepectoral region was  prepped and draped in usual sterile fashion.  Local anesthesia was  obtained with infiltration of 1% lidocaine with epinephrine throughout  the left prepectoral region and over the previous incision site.  The  incision was then opened and the wound was carried down by sharp  dissection to the pacemaker pocket.  There were copious amounts of  clotted blood were evacuated.  The pocket was copiously irrigated with  1% kanamycin solution as well as normal saline solution.  The pocket was  inspected for active bleeding.  None was found.  The device was removed  in the pocket and irrigated with kanamycin solution as well.  Surgicel  was then applied throughout the pocket and the device was replaced with  the leads round beneath.  The wound was then closed using 2-0 Vicryl  in  a running fashion for the subcutaneous layer.  Two layers applied.  The  skin was approximated using 2-0 Vicryl in a running subcuticular  fashion.  Steri-Strips and a sterile dressing were applied.  The patient  was transported to recovery area in stable condition.   IMPRESSION:  Successful pocket evacuation without clear bleeding source.   PLAN:  The patient has received IV vitamin K for an INR of 3.8, as well  as 1 unit of FFP.  His INR will be returned to the normal level for  approximately 3 days and then systemic anticoagulation reinitiated with  oral warfarin.  In the meantime, we will avoid any anticoagulants.      Francisca December, M.D.  Electronically Signed     JHE/MEDQ  D:  12/02/2007  T:  12/03/2007  Job:  [id number]

## 2010-10-21 NOTE — H&P (Signed)
NAME:  [name], [name] NO.:  [id number]   MEDICAL RECORD NO.:  [id number]          PATIENT TYPE:  EMS   LOCATION:  MAJO                         FACILITY:  [facility]   PHYSICIAN:  Francisca December, M.D.  DATE OF BIRTH:  1945/09/23   DATE OF ADMISSION:  07/31/2007  DATE OF DISCHARGE:                              HISTORY & PHYSICAL   CHIEF COMPLAINT:  Shortness of breath.   [name] is a 65 year old Caucasian male, with a history of ischemic  cardiomyopathy and mitral valve replacement with re-do tissue valve, who  now presents with shortness of breath and abdominal distention.  Patient  was recently hospitalized for a CHF exacerbation and pneumonia.  He was  discharged on his home medications with no changes.  Over the last 3 to  4 days the patient has had emesis with increased abdominal girth.  Per  family, patient does not use a fluid restriction.  He also has noted  feeling cold with some chills.  There have been no sick contacts at  home.  For 24 hours he has noted a dry cough along with his non-bloody  emesis but denies any diarrhea.  He notes no worsening of his chronic  mild orthopnea.  He did attempt to take his medications despite his  intermittent emesis.  The patient was subsequently brought to the  emergency room where he is lethargic but arousable and has been  intermittently confused per daughter.  He denies any chest pain at this  time.   PAST MEDICAL HISTORY:  1. Rheumatic heart disease with mitral valve replacement with a Bjork-      Shiley valve in 1982.  This was subsequently replaced in January of      2008 with a tissue valve due to multiple episodes of emboli and      clotting of the valve.  2. Coronary artery disease with ischemic cardiomyopathy status post      stent placement to the circumflex.  EF is 20%.  3. History of A-V nodal ablation and pacemaker.  4. Recent retroperitoneal hematoma secondary to heparin overdose      during one of  the hospitalizations.  5. 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  6. History of A-fib/flutter.  7. Hyperlipidemia.   ALLERGIES:  No known drug allergies.   MEDICATIONS:  1. Advair Discus.  2. Aspirin 81 mg p.o. daily.  3. Vicodin 5/500 mg, 1-2 tabs p.o. q.4-6h. p.r.n.  4. Furosemide 40 mg p.o. b.i.d.  5. Acetazolamide 250 mg p.o. b.i.d.  6. Coumadin 2.5 mg daily with 5 mg on Mondays and Fridays.  7. Lisinopril 5 mg p.o. daily.  8. Digoxin 0.25 mg p.o. daily.  9. Crestor 20 mg, 1/2 tablet p.o. daily.  10.Coreg 3.125 mg p.o. daily.  11.Zetia 10 mg p.o. daily.  12.Zofran 4 mg p.o. q.6h. p.r.n.   SOCIAL HISTORY:  Patient lives in the [place] area with his daughter.  He quit tobacco use and alcohol use 2 years ago.   FAMILY HISTORY:  1. Notable  for a mother who died of CVA.  2. Father who died at age 84 of MI.  3. Brother with heart problems who expired at age 78 secondary to      rheumatic heart disease.   REVIEW OF SYSTEMS:  Notable for weakness, nausea and shortness of  breath.  The rest of the 12-point review of systems was reviewed and is  negative.   PHYSICAL EXAM:  Temperature 98.1, pulse 71, respiratory rate 22 to 28,  blood pressure 164/77, sating 99% on 100% nonrebreather.  GENERAL:  Patient is an ill-appearing gentleman in mild distress.  HEENT:  Normocephalic, atraumatic.  Pupils equal and round, reactive to  light, extraocular movements are intact.  Sclera are clear.  NECK:  Shows mild JVD and no bruits.  CARDIOVASCULAR:  Regular rhythm, normal rate.  No murmurs, rubs or  gallops.  LUNGS:  Demonstrate bibasilar crackles.  ABDOMEN:  Distended with positive fluid wave level, positive bowel  sounds.  EXTREMITIES:  No cyanosis or clubbing, there is trace bilateral lower  extremity edema.  NEURO:  Cranial nerves II through XII grossly intact.  No focal  musculoskeletal or sensory deficits, there is no  asterixis.  SKIN:  Demonstrates no rashes.  LYMPH:  Demonstrates no lymph adenopathy.  MUSCULOSKELETAL:  Demonstrates no joint effusions or tenderness.   Chest x-ray demonstrates mild pulmonary edema and congestive heart  failure, no effusions.  Electrocardiogram demonstrates a V-paced rhythm  with underlying atrial fibrillation/flutter with a rate of 70.   LABS:  White count is 10, hemoglobin is 12.5, platelet count of 241,000.  BUN is 9, creatinine is 1.2, glucose 132, troponin is less than 0.05 x2,  BNP is 149, digoxin level is 1.2, INR is 1.9.   ASSESSMENT AND PLAN:  This is a 65 year old Caucasian male with ischemic  cardiomyopathy who presents with multiple symptoms concerning for  congestive heart failure exacerbation with congestive hepatopathy.  1. Congestive heart failure.  We will initiate IV Lasix. The BNP is      lower than his prior congestive heart failure exacerbation, which      complicates the understanding of this clinical picture.  Digoxin      will be held, given his digoxin level of 1.2, to trend it down to a      target of 0.6 to 0.9.  2. Congestive hepatopathy.  We will check LFT and obtain a KUB.      Crestor and Zetia will be held pending the      LFTs.  3. Atrial fibrillation.  We will continue Coumadin.  4. Renal function.  Appears to be stable and we will monitor for      worsening creatinine.      Reginia Forts, MD   Electronically Signed     ______________________________  Francisca December, M.D.    RA/MEDQ  D:  07/31/2007  T:  07/31/2007  Job:  [id number]   cc:   Francisca December, M.D.

## 2010-10-21 NOTE — H&P (Signed)
NAMEMIRO, BALDERSON NO.:  [id number]   MEDICAL RECORD NO.:  [id number]          PATIENT TYPE:  INP   LOCATION:  2013                         FACILITY:  [facility]   PHYSICIAN:  Dr. Pearlean Brownie              DATE OF BIRTH:  [birth year]   DATE OF ADMISSION:  02/23/2008  DATE OF DISCHARGE:                              HISTORY & PHYSICAL   CHIEF COMPLAINT:  Shortness of breath/headache/dizziness.   HISTORY OF PRESENT ILLNESS:  The patient is a 65 year old male with past  medical history significant for class III CHF, status post AICD, CAD,  multiple embolic CVAs in the setting of left atrial mural thrombus, MVR,  hypertension, and atrial fibrillation on chronic Coumadin who presented  to the hospital secondary to increased shortness of breath and right-  sided headache associated with mild slurring of speech.  The patient  reports one day prior to admission, he had increasing shortness of  breath and noted a brief episode of feeling like he was drunk (unsteady  on his feet) which resolved after he sat down.  On the night of  admission, around 7 p.m., the patient had a right-sided headache with  some slurring of speech.  The patient's headache went away after he  received 2 Vicodin in the ED.  The patient does not have any current  slurring of speech above baseline or other new neuro findings.  Neurology is being consulted for possible TIA.   PAST MEDICAL HISTORY:  1. History of chest wall hematoma after ICD placement.  2. MVR.  3. Left atrial mural thrombosis.  4. Class III CHF, EF around 20%.  5. Dilated cardiomyopathy.  6. AFib.  7. Hypertension.  8. Chronic left bundle-branch block.  9. Multiple embolic strokes.  10.History of noncompliance with Coumadin.  11.Retroperitoneal hematoma in January 2009.  12.Hyperlipidemia.  13.CAD, status post stent in the left circumflex.   PAST SURGICAL HISTORY:  See above.   FAMILY HISTORY:  Noncontributory.   ALLERGIES:  No  known drug allergies.   SOCIAL HISTORY:  Former smoker, quit in 2004, occasional alcohol, denies  illicit drugs, divorced, lives in [place].   HOME MEDICATIONS:  1. Amiodarone.  2. Coumadin.  3. Digoxin.  4. Lisinopril.  5. KCl 20 mEq daily.  6. Aspirin 81 mg daily.  7. Zetia 10 mg daily.  8. Crestor 20 mg daily.  9. Coreg 6.25 mg b.i.d.   PHYSICAL EXAMINATION:  VITAL SIGNS:  Temperature equals 96.6, pulse  equals 63, respiratory rate equals 20, BP equals 129/78, and O2  saturations equals 95% on 2 L.  GENERAL:  NAD.  HEENT:  AT, [place], EOMI, PERRLA, MMM, no erythema.  NECK:  Supple.  CV:  S1, S2, positive murmur.  LUNGS:  Mild bibasilar crackles, no rhonchi, no wheezing.  ABDOMEN:  Soft, right lower quadrant tenderness.  Positive bowel sounds.  Soft, no guarding, no rebound.  NEUROLOGIC:  Alert and oriented x3.  Cranial nerves II through XII  grossly intact, the patient with some slurring of speech but at baseline  per  son and the patient, PERRLA, EOMI, motor 5/5 in the upper and lower  extremities, sensation intact to light touch, cerebellar intact with  finger-to-nose, fine motor intact, rapid alternating movements intact,  and DTRs intact.  EXTREMITIES:  No edema.  GU:  No CVA tenderness.  PSYCH:  Appropriate.   LABORATORY DATA:  WBC 9.1, hemoglobin 12.2, platelets 169, MCV 87.1, RDW  17.0, and ANC equals 7.0.  Sodium 141, potassium 4.3, chloride 110,  bicarb 24, BUN 9, creatinine 1.13, and glucose 103.  BNP 663, calcium  equals 8.9, CK equals 137, CK-MB equals 3.0, and troponin equals 0.04.  Second set of cardiac enzymes, CK equals 111, CK-MB 2.7, and troponin  equals 0.03.  Digoxin less than 0.2.  PT equals 15.4 and INR equals 1.2.  Cholesterol equals 155, triglycerides equals 51, HDL equals 37, and LDL  equals 108.  UA is normal.  Chest x-ray showed no acute cardiopulmonary  process.  Diffusely increased interstitial lung markings noted, without  focal pulmonary  opacity.  CT of the head, chronic changes including  encephalomalacia within the right frontal lobe and left parietal lobe  and anterior left temporal lobe.  Right basal ganglia lacunar infarct.  Diffuse patchy low-density throughout the subcortical and  periventricular white matter consistent with chronic small vessel  ischemic changes.  Prominence of sulci and ventricles consistent with  brain atrophy.  Paranasal sinuses and mastoid air cells are all normally  aerated.  No acute abnormalities.   ASSESSMENT AND PLAN:  Right-sided headache/dizziness/mild slurring of  speech.  Worrisome for transient ischemic attack.  CT of the head  without acute abnormalities.  Likely source is cardiac and was from left  atrium.  The patient had a 2-D echo within the last few months which  showed stable chronic thrombus in the left atrium.  He also had carotid  Dopplers in February 2008 which showed no significant stenosis.  We will  check a homocysteine level and consider getting repeat bilateral carotid  Dopplers, although it has been 1-1/2 years since last one, and if this  was a TIA, it is likely from the cardiac source.  The patient will need  tighter cholesterol control with LDL preferably closer to 70.  We will  not repeat TEE because of an unchanged management but be careful with  heparin,  Coumadin, and aspirin secondary to increased bleeding risk.  We will ask  Cardiology about using aspirin and Coumadin (the patient does have a  stent).  The patient needs to maintain therapeutic INR.   Thank you for this consult.      Rufina Falco, M.D.  Electronically Signed     ______________________________  Dr. Jerry Caras  D:  02/24/2008  T:  02/25/2008  Job:  [id number]

## 2010-10-21 NOTE — H&P (Signed)
NAMEGURSHAAN, MATSUOKA NO.:  [id number]   MEDICAL RECORD NO.:  [id number]          PATIENT TYPE:  INP   LOCATION:  1824                         FACILITY:  [facility]   PHYSICIAN:  Wendi Snipes, MD DATE OF BIRTH:  July 25, 1945   DATE OF ADMISSION:  12/02/2007  DATE OF DISCHARGE:                              HISTORY & PHYSICAL   CHIEF COMPLAINT:  Swelling at the pacer site.   HISTORY OF PRESENT ILLNESS:  This is a 65 year old white male with  coronary disease and ischemic cardiomyopathy with estimated EF of 25%,  who was recently admitted for BiV upgrade of his dual-chamber pacemaker  on November 23, 2007, and now complains of progressive swelling around the  pacer revision site over the last week.  He states that he has been in  contact with his physician and instructed to treat the progressive  swelling with ice packs and pressure and has been taking his  antibiotics; however, it has been progressive now is extremely painful  and much larger and it was few days ago.  The patient states that he  otherwise has been in his usual state of health with no signs of  congestive heart failure including no increased lower extremity edema.  However, he does report some mild shortness of breath starting today.  He states that he has been afebrile and without any other signs of  systemic illness.   PAST MEDICAL HISTORY:  1. Severe ischemic cardiomyopathy with EF of 25%.  2. Status post biventricular upgrade.  3. Chronic systolic congestive heart failure, class III symptoms.  4. History of atrial fibrillation.  5. Chronic history of strokes.  6. History of left atrial thrombus in February 2009.  7. History of mitral valve replacement with a tissue valve and      multiple episodes of clotting of the valve.  8. Noncompliance with Coumadin.  9. Hyperlipidemia.  10.Coronary artery disease status post PCI to the left circumflex.   PAIN MEDICATIONS.:  1. Amiodarone 400 mg daily.  2. Coumadin 5 mg daily.  3. Potassium 20 mEq daily.  4. Lisinopril 5 mg daily.  5. Digoxin 0.25 mg once a day.  6. Aspirin 81 mg daily.  7. Crestor 20 mg daily.  8. Coreg 6.25 twice a day.  9. Zetia 10 mg daily.  10.Mucinex as needed.  11.Levaquin 500 mg daily.   SOCIAL HISTORY:  The patient lives in [place] with his daughter.  He  quit tobacco and alcohol use 2 years ago.   FAMILY HISTORY:  Father died of MI at 73 and mother died of stroke.   REVIEW OF SYSTEMS:  All 14 systems were reviewed and were negative  except as mentioned in the HPI.   PHYSICAL EXAMINATION:  VITAL SIGNS:  Blood pressure 150/68, heart rate  60, respirations 16, temperature 97.0 degrees Fahrenheit, sating 99% on  room air.  GENERAL:  He is a 65 year old white male appearing older than stated  age, in no acute distress.  HEENT:  Moist mucous membranes.  Pupils equal, round, and reactive to  light and accommodation.  Anicteric  sclera.  NECK:  A 10-cm jugular venous distention.  No thyromegaly.  HEART:  Regular rate and rhythm.  A 2/6 holosystolic murmur at the left  lower sternal border.  LUNGS:  Clear to auscultation bilaterally.  ABDOMEN:  Nontender and nondistended.  Positive bowel sounds.  EXTREMITIES:  2+ pulses.  Trace pitting edema.  SKIN:  Warm, dry, and intact.  No rashes.  There is an approximately 13  x 18 cm indurated and fluctuant area of erythema and edema overlying his  pacer site that is still dressed with Steri-Strips and no apparent  discharge; however, warm to the touch.   LABORATORY DATA:  Chest x-ray is pending.   ASSESSMENT/PLAN:  A 65 year old white male status post upgrade with  pacer site hematoma.  Hematoma.  We will admit to the [facility]iology service for further  evaluation and management of this pacer site hematoma.  Consider  incision and drainage; however, we will obtain urine cultures and blood  cultures for signs of systemic infection.  Obtain a chest x-ray,   consider incision and drainage.  We will continue his current  antibiotics.      Wendi Snipes, MD  Electronically Signed     BHH/MEDQ  D:  12/02/2007  T:  12/02/2007  Job:  [id number]
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
## 2010-10-21 NOTE — Op Note (Signed)
[name], EDMONDSON NO.:  [id number]   MEDICAL RECORD NO.:  [id number]          PATIENT TYPE:  OIB   LOCATION:  2899                         FACILITY:  [facility]   PHYSICIAN:  Francisca December, M.D.  DATE OF BIRTH:  25-Jun-1945   DATE OF PROCEDURE:  DATE OF DISCHARGE:  08/09/2008                               OPERATIVE REPORT   PROCEDURE PERFORMED:  Direct current cardioversion.   INDICATION:  [name] is a 65 year old man with extensive cardiac  history including mitral valve replacement x2, known history of left  atrial thrombus, and cardiomyopathy with an insertion of  ICD/biventricular pacemaker approximately 6-9 months ago.  He has been  treated with p.o. amiodarone and had been maintaining AV pacing rhythm  until approximately 2 weeks ago.  He had at that time recurrence of  atrial flutter.  He has been anticoagulated long-term.  He is brought  now to the outpatient department for elective cardioversion in order to  maintain AV synchrony as well as reduce likelihood of left atrial  thrombus recurrence.   ANESTHESIA:  Provided by Dr. Randa Evens of Anesthesia Department, 175 mg of  IV Pentothal.   PROCEDURE:  While monitoring heart rate, blood pressure, O2 saturation,  and ECG, the patient received 175 mg of IV Pentothal.  After  establishing deep anesthesia, the patient received a single  transthoracic dose of direct current energy, 50 joules, synchronized  biphasic.  He had prompt return of an AV-paced rhythm.  Previously, the  device had been in the mode switch with clear evidence of atrial flutter  seen on the atrial electrogram.  After his cardioversion, this was no  longer apparent and as mentioned, he was AV paced and even down to a  heart rate of 50 beats per minute.  I suspect there is very little sinus  activity.   IMPRESSION:  Successful elective cardioversion of atrial flutter to  atrioventricular paced rhythm.   PLAN:  1. Continue  amiodarone.  Check amiodarone levels.  2. Continue warfarin.  3. PT/INR check tomorrow.  4. Routine follow up in the office as planned.       Francisca December, M.D.  Electronically Signed     JHE/MEDQ  D:  08/09/2008  T:  08/10/2008  Job:  [id number]

## 2010-10-24 NOTE — H&P (Signed)
NAME:  [name], [name] NO.:  [id number]   MEDICAL RECORD NO.:  [id number]                   PATIENT TYPE:  IPS   LOCATION:  0300                                 FACILITY:  [facility]   PHYSICIAN:  Jeanice Lim, M.D.              DATE OF BIRTH:  1945/07/09   DATE OF ADMISSION:  04/01/2003  DATE OF DISCHARGE:                         PSYCHIATRIC ADMISSION ASSESSMENT   DATE OF ASSESSMENT:  April 02, 2003   PATIENT IDENTIFICATION:  This is a 65 year old divorced white male who is a  voluntary admission.   HISTORY OF PRESENT ILLNESS:  This patient drank 12 beers yesterday and while  under the influence of these beers, he had a gun, had it loaded, and said he  felt like shooting himself.  He then thought better of the idea, called 911,  and gave the gun over to the sheriff.  He endorses feelings of depressed  mood, anhedonia, and some irritability on and off since 1996 when he  divorced his wife.  These feelings have been made worse over the past four  months by stress of a relationship with a girlfriend whom he loves but  prefers cocaine over him.  He denies any disruption in his sleep, denies  appetite change or weight loss.  He reports that he has some increase in  irritability, decrease in concentration, and some increased anxiety and  increased worry over various things, and becomes anxious without clear  history of panic.  He does endorse drinking beer, usually drinks one or two  beers once or twice a week.  He denies that he has any withdrawal symptoms.  Today, he denies any suicidal ideation, feels that what he did yesterday was  stupid and he denies any suicidal ideation or homicidal ideation.   PAST PSYCHIATRIC HISTORY:  This is the patient's first admission to [facility].  He does have a history of one prior  psychiatric admission in 1996 when he became anxious and depressed at the  time of his divorce.  At that point,  he walked all the way to [facility] and  back and was hospitalized for depression.  He also has a history of a prior  suicide attempt by overdose but it is unclear if that coincided with his  [facility] admission in 1996.  The patient denies that he has been contemplating  suicide recently.   SUBSTANCE ABUSE HISTORY:  The patient reports drinking one to two beers one  to two times a week.   PAST MEDICAL HISTORY:  The patient is followed by Dr. Delrae Alfred at [facility] and by Georga Hacking, M.D., who is  his cardiologist.  Medical problems are skin rash, not otherwise specified,  atrial flutter.  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   MEDICATIONS:  The patient's medications are obtained at [facility] on [address]:  1. Amiodarone 200 mg p.o. q.a.m.  2. Lasix 40 mg p.o. q.a.m.  3. Coreg 6.25 mg p.o. b.i.d.  4. Coumadin 7.5 mg daily.  5. Zocor 20 mg daily.  6. Accupril 10 mg daily.   DRUG ALLERGIES:  No known drug allergies.   REVIEW OF SYSTEMS:  Review of systems is remarkable for no history of  seizure, no history of dizziness or syncope, no chest pain or flutter  although he does have a history of recurrent atrial flutter.  He denies any  current withdrawal symptoms.   PHYSICAL EXAMINATION:  GENERAL:  The patient's physical examination was done  in the emergency room by Dr. Paula Libra and is generally unremarkable.  Today, we do note that he has red rash with silvery crust that extends  across his scalp between his eyebrows down the top of his nose and also some  silver scaling on his elbows, no signs of infection but quite a bit of  dander production.  Slight build; he is somewhat dirty and disheveled.  VITAL SIGNS:  On admission, temperature 97.1, pulse 45, respirations 18,  blood pressure 115/69.  He weighed 141 pounds and  is 5 feet 6 inches tall.  On recheck today, his apical pulse is 52 at one full minute and his blood  pressure today is 134/89.   SOCIAL HISTORY:  The patient was born and raised in [place], one of  several children to a supportive family.  He denies any history of abuse  growing up.  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  He has been divorced since 1996 after 26 years of marriage.  He has  two children who are estranged from him and eight grandchildren.  No legal  problems.  He occupies his time by taking gambling trips, which he enjoys,  to various casinos in surrounding states.  He lives on his own.   FAMILY HISTORY:  Family history is not clear.  The patient is unsure.   MENTAL STATUS EXAM:  This is a fully alert male who is in no acute distress.  He is of slight build, somewhat unkempt.  Clothing is dirty.  He is pleasant  and cooperative.  His motor movements are smooth, shows no signs of  withdrawal, and denies any subjective signs of withdrawal.  He is well  focused, sociable, pleasant, cooperative.  Speech is normal in pace and  tone.  He is able to appreciate humor.  He is fairly articulate.  Mood is  depressed.  He is somewhat irritable.  Thought process is logical and  coherent; no evidence homicidal ideation, no auditory and visual  hallucinations, no signs of psychosis.  He is full control of his faculties.  He has obviously had some suicidal thoughts and his thought content is  dominated by grief that he feels over the relationship with this woman and  his disappointment, feeling somewhat lonely and discouraged with the  relationship.  He has obviously had some suicidal thought, no clear suicidal  ideation at this time.  Cognitive: Intact and oriented x 3.  Intelligence is average.  Insight: Fair to good.  Impulse control and judgment: Within  normal limits.    ADMISSION DIAGNOSES:   AXIS I:  1. Major depressive disorder, recurrent, moderate.  2. Alcohol abuse, rule out dependence.   AXIS II:  Deferred.   AXIS III:  1. Status post mitral valve replacement.  2. Status post atrial thrombus.  3. Chronic atrial flutter.   AXIS IV:  Severe stress from relationship conflicts with his girlfriend.   AXIS V:  Current 39, past year 70.   INITIAL PLAN OF CARE:  Plan is to voluntarily admit the patient with q.82m.  checks in place with a goal to lift his depression and alleviate his  suicidal thought.  We are going to start him on Lexapro 5 mg p.o. daily and  have also placed him on Librium 25 mg p.o. q.6.h. p.r.n. for any withdrawal  symptoms.  We have talked to him about these medications and the use of a  Librium should he experience any withdrawal symptoms.  We have discussed the  risks and benefits of the Lexapro and the expected effects and possible side  effects.  He is in agreement with these two medications as his plan.  Meanwhile, we are going to ask pharmacy to manage his coagulation protocol  and we will restart his routine medications.  He is being seen by W. Viann Fish, M.D., every week to have his pro time drawn and we will get an  appointment for him at his primary care physician for his seborrheic rash  versus psoriasis.  The patient is in agreement with the plan.   ESTIMATED LENGTH OF STAY:  Five to six days.     Margaret A. Stephannie Peters                   Jeanice Lim, M.D.    MAS/MEDQ  D:  04/02/2003  T:  04/02/2003  Job:  [id number]

## 2010-10-24 NOTE — Discharge Summary (Signed)
NAMEGAMAL, TODISCO NO.:  [id number]   MEDICAL RECORD NO.:  [id number]          PATIENT TYPE:  INP   LOCATION:  2002                         FACILITY:  [facility]   PHYSICIAN:  Salvatore Decent. Dorris Fetch, M.D.DATE OF BIRTH:  1945-07-25   DATE OF ADMISSION:  06/07/2006  DATE OF DISCHARGE:  06/27/2006                               DISCHARGE SUMMARY   ADDENDUM   Addendum to Job Number [id number].   Initially, it was anticipated that [name] would be ready for  discharge home on June 26, 2006.  However, His INR was  supratherapeutic at 3.5.  Therefore, he remained hospitalized an  additional day to make sure there was no further increase.  His Coumadin  was held that day.  His followup INR on January 20 was 2.7.  He remains  stable and is therefore felt appropriate for discharge home that day.  Prior to discharge, his Lasix was decreased.  His volume excess was  improving.  His creatinine remained stable at 1.4.  He continued to make  progress with his mobility and was discharged home on June 27, 2006  postoperative day ten with home health therapy.   His updated medication list is as follows:  1. Aspirin 81 mg p.o. daily.  2. Coreg 6.25 mg p.o. b.i.d.  3. Lisinopril 20 mg daily.  4. Crestor 20 mg daily.  5. Ultram 50 mg 1 to 2 tablets every four to six hours as needed.  6. Zetia 10 mg daily.  7. Coumadin 2.5 mg q.p.m. and as directed.  8. Lasix 40 mg daily.  9. Potassium chloride 20 mEq daily.  10.Amiodarone 400 mg p.o. b.i.d. x7 days and then decrease it to 400      mg daily.  11.Digoxin 0.25 mg daily.  12.Multivitamin p.o. daily.   His discharge instructions are as previously dictated with the exception  that he is going to have his PT/INR laboratory work drawn on June 29, 2006 by the home health nurse with the results sent to Dr. York Spaniel  office.      [name], P.A.    ______________________________  Salvatore Decent Dorris Fetch,  M.D.    AWZ/MEDQ  D:  08/16/2006  T:  08/16/2006  Job:  [id number]

## 2010-10-24 NOTE — Discharge Summary (Signed)
NAME:  [name], [name] NO.:  [id number]   MEDICAL RECORD NO.:  [id number]          PATIENT TYPE:  IPS   LOCATION:  0500                          [facility]ACILITY[facility]  [facility]   PHYSICIAN:  Anselm Jungling, MD  DATE OF BIRTH:  1945-12-18   DATE OF ADMISSION:  01/05/2005  DATE OF DISCHARGE:  01/12/2005                                 DISCHARGE SUMMARY   IDENTIFYING DATA AND REASON FOR ADMISSION:  This was the first [facility] admission  for [name], a 65 year old divorced, retired male who was admitted due to long-  standing alcohol abuse and dependence.  In addition, he presented with  depression and signs and symptoms of dementia.  Please refer to the  admission note for further details pertaining to the symptoms, circumstances  and history that lead to his hospitalization at [name].   ADMISSION MEDICATIONS:  The patient had multiple medical problems and was on  various medication for these, including Coumadin, Cordarone, aspirin, Coreg,  Lasix and Lisinopril.  He was not taking any psychotropic medications at the  time of admission.   INITIAL IMPRESSION:  AXIS I:  Alcohol abuse/dependence.  Major depressive  disorder, recurrent.  Rule out dementia, not otherwise specified.  AXIS II:  Deferred.  AXIS III:  History of cerebrovascular accident.  History of atrial  fibrillation.  Coumadin therapy.  AXIS IV:  Stressors severe.  AXIS V:  Global assessment of function 30.   MEDICAL/LABORATORY AND PHYSICAL EXAMINATION:  The patient's Coumadin level  was managed and monitored through the [facility] pharmacy department.  The patient  was medically cleared prior to admission to the adult psychiatric inpatient  unit.  Admission laboratory including chemistry panel was normal with the  exception of slightly elevated glucose at 113, and elevated creatinine at  1.8.  TSH was within normal limits.  Hepatitis panel was negative.   During the course of his inpatient stay he was continued on his usual  regimen of Coumadin, per pharmacy protocol, as well as Coreg, Zocor, Zetia,  Lisinopril and Lasix.   HOSPITAL COURSE:  The patient was admitted to the adult inpatient  psychiatric service and immediately placed on the Librium withdrawal  protocol.  He required significant doses of Librium throughout his stay, but  his detoxification proceeded uneventfully.  The patient appeared to have  some cognitive difficulties including memory, difficulties in confusion, and  initially it was not clear whether these were due to his long-standing  alcohol use or whether they were in fact consequences of a more short-term  nature of his alcohol use or perhaps a consequence of his previous CVA or  all three combined.   As the patient's detoxification progressed, it became more apparent that he  seemed to have some chronic organic brain difficulties.  It was not possible  to complete determine the extent to which these were due to CVA versus a  Wernicke-Korsakoff-type psychosis versus more acute alcohol-related effects.   A significant concern during the course of the patient's treatment was his  ability to care for himself at home following his inpatient stabilization.  Although there  was great interest on the part of the undersigned and the  staff involving his family in discussion about this, the patient remained  steadfastly adamant that he would return to his home and care for himself.  He indicated that he had no interest in going to any halfway house for  alcohol dependence, such as the [facility].  He was not open to  considering any other form of assisted living.   AFTERCARE:  The patient was discharged in good spirits and in good physical  condition.  There was a plan for him to continue in our chemical dependency  intensive outpatient program at [facility] beginning Tuesday, January 13, 2005.  At the time of his discharge there was some doubt as to whether he  would follow up with that  plan   DISCHARGE MEDICATIONS:  Did not include any psychotropic medications on a  regular basis.  The patient was discharged on his usual medications for his  medical conditions, as described above.   DISCHARGE DIAGNOSES:  AXIS I:  Alcohol abuse/dependence.  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  Rule out  major depressive disorder.  AXIS II:  Deferred.  AXIS III:  History of mitral valve prolapse.  History of cerebrovascular  accident.  Atrial fibrillation.  AXIS IV:  Stressors severe.  AXIS V:  Global assessment of function on discharge 50.           ______________________________  Anselm Jungling, MD  Electronically Signed     SPB/MEDQ  D:  02/03/2005  T:  02/03/2005  Job:  [id number]

## 2010-10-24 NOTE — Consult Note (Signed)
[name], [name] NO.:  [id number]   MEDICAL RECORD NO.:  [id number]          PATIENT TYPE:  INP   LOCATION:  2031                         FACILITY:  [facility]   PHYSICIAN:  [name], M.D.  DATE OF BIRTH:  04-17-1946   DATE OF CONSULTATION:  10/06/2005  DATE OF DISCHARGE:                                   CONSULTATION   REQUESTING PHYSICIAN:  Dr. Viann [name]   INDICATION FOR CONSULTATION:  Evaluation of recurrent atrial fibrillation.   PRESENT ILLNESS:  The patient is a 65 year old male with a history of atrial  flutter in the past status post catheter ablation, history of non-ischemic  cardiomyopathy with underlying coronary artery disease status post stenting  of the left circumflex with a history of mitral valve replacement (Bjork-  Shiley), and a history of recurrent atrial arrhythmias.  The patient has had  intermittent episodes of atrial fibrillation and most recently underwent  cardioversion by Dr. Donnie [name] on April 20.  He had been on amiodarone but  interestingly enough, denied noncompliance, but had very low amiodarone  levels.  This was despite 400 mg a day which has been prescribed of  amiodarone.  The patient has a history of thromboembolic complications and  has been difficult to keep anticoagulated as he has had noncompliance  problems in the past.  He has had no syncope.  He is admitted to hospital  with recurrent atrial fibrillation associated with congestive heart failure  symptoms.  His BNP was 500.   PAST MEDICAL HISTORY:  1.  History of stroke in the past.  2.  History of peripheral vascular disease.  3.  History of alcohol abuse.  4.  History of congestive heart failure.  5.  History of hypertension.  6.  Strong family history of heart disease.  7.  Remote history of rheumatic fever resulting in his mitral valve      replacement.   MEDICATIONS:  Coreg, Vytorin, amiodarone, Quinapril, Coumadin, and  furosemide.   SOCIAL  HISTORY:  The patient is divorced and lives in [place].  He is  retired.  He has a history of tobacco use, but stopped smoking three years  ago.   FAMILY HISTORY:  Notable for mother dying in her 58s of a stroke and an MI  and a dad dying in his 92s of an MI.   REVIEW OF SYSTEMS:  Negative for fevers, chills, night sweats, or recent  weight change.  He denies headache, vision or hearing problems.  He denies  skin rashes or lesions.  He denies chest pain, orthopnea, but does have  shortness of breath, dyspnea, and PND.  He has palpitations.  He has  peripheral edema.  He denies claudication or cough.  He denies dysuria,  hematuria, or nocturia.  He denies weakness, numbness, depression or  anxiety.  Denies nausea, vomiting, diarrhea, constipation, polyuria,  polydipsia, heat or cold intolerance.  Denies joint problems.   PHYSICAL EXAMINATION:  GENERAL:  He is a pleasant middle-aged man in no  acute distress.  VITAL SIGNS:  Blood pressure 118/85, the pulse 92 and  irregularly irregular,  respirations are 20, temperature is 96.6.  HEENT:  Normocephalic and atraumatic.  Pupils equals and round.  The  oropharynx moist.  Sclerae were anicteric.  NECK:  No jugular distension.  LUNGS:  Rales in the bases bilaterally.  There is no increased work of  breathing.  CARDIOVASCULAR:  Irregularly irregular rhythm with normal S1-S2.  PMI was  laterally displaced and enlarged.  ABDOMEN:  Soft, nontender, nondistended.  There is no organomegaly.  The  bowel sounds are present.  There is no rebound or guarding.  EXTREMITIES:  Trace peripheral edema bilaterally.  NEUROLOGIC:  Alert and oriented x3 with cranial nerves intact.  Strength is  5/5 and symmetric.   EKG demonstrates atrial fibrillation with a nonspecific interventricular  conduction delay and QRS duration of 122 milliseconds.   IMPRESSION:  1.  Recurrent atrial fibrillation.  2.  History of atrial flutter status post ablation.  3.   Congestive heart failure.  4.  IVCD.  5.  Status post mitral valve replacement.  6.  History of medical noncompliance with low amiodarone levels.   DISCUSSION:  The real central issue is with [name] is taking his  amiodarone and failing amiodarone therapy secondary to low levels (fast  metabolizer) or whether he is actually taking the amiodarone at all.  For  this reason I would recommend that the patient be kept in the hospital and  given high-dose amiodarone for several days so that we can monitor that he  is in fact actually taking the medication and to follow his ventricular  response to the amiodarone.  Ultimately repeat cardioversion would be  warranted.  Also, I would like to obtain evaluation of his pacemaker and see  what his __________ show.  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  Ultimately, consideration would be made for AV node  ablation and bi-V ICD implantation with discontinuation of amiodarone if he  cannot be maintained in sinus rhythm on amiodarone.  Because of his prior  mitral valve surgery and other issues I think he is a very poor candidate  for either Maze surgery or catheter ablation of atrial fibrillation.           ______________________________  [name], M.D.     GWT/MEDQ  D:  10/06/2005  T:  10/07/2005  Job:  [id number]   cc:   [name], M.D.  Fax: [phone]

## 2010-10-24 NOTE — Discharge Summary (Signed)
[name], [name] NO.:  [id number]   MEDICAL RECORD NO.:  [id number]          PATIENT TYPE:  INP   LOCATION:  4729                         FACILITY:  [facility]   PHYSICIAN:  Georga Hacking, M.D.DATE OF BIRTH:  1945/09/24   DATE OF ADMISSION:  09/24/2005  DATE OF DISCHARGE:  09/26/2005                                 DISCHARGE SUMMARY   FINAL DIAGNOSES:  1.  Atrial fibrillation/flutter, recurrent.  2.  Idiopathic cardiomyopathy with congestive heart failure.  3.  Coronary artery disease with previous stent.  4.  Previous shot of mitral valve for mitral valve disease.  5.  Chronic Coumadin anticoagulation.   PROCEDURES:  Cardioversion.   HISTORY OF PRESENT ILLNESS:  This 65 year old male was admitted to the  hospital with increasing shortness of breath.  He has a prior [employer]  mitral valve with paroxysmal atrial fibrillation and flutter.  He has had  multiple readmissions over the years and has had repeated cardioversions.  He developed increasing shortness of breath one week ago associated with  edema, PND and worsening dyspnea on exertion.  He feels as if he cannot get  a deep breath.  His last Cardioversion is in February of this year.  He has  had difficulty with fluctuating pro times and has had issues with  noncompliance.  He has had some admissions for alcohol abuse over the years.  He is in rampant atrial flutter and fibrillation and states he has been  taking medication properly.  Pro time INR was 4.4.  He feels he has been  compliant with medications, but wonders if he had pneumonia.  He has had  known history of radiofrequency ablation for atrial flutter and also has a  permanent pacemaker.  Her has a left atrial flutter also.  Please see the  previously dictated History and Physical for remainder of the details.   HOSPITAL COURSE:  Lab done on admission showed a normal CBC.  Pro time INR  was 3.2.  Troponin was 0.05 with an M.B. of 4.2.   Glucose was 141.  Amiodarone level was somewhat low.  TSH was normal.  He felt better and was  placed on Lasix as well as increased amiodarone and Cardizem.  He underwent  Cardioversion on September 25, 2005, with 120 watts going to sinus rhythm.  He  was improved the next day.  That evening felt better and was discharged to  continue his medications the next day.   DISCHARGE MEDICATIONS:  1.  Amiodarone 200 mg b.i.d.  2.  Coumadin 5 mg daily.  3.  Coreg 6.25 mg daily.  4.  Crestor 10 mg daily.  5.  Lasix 40 mg daily.  6.  Lisinopril 10 mg daily.  7.  Prenatal vitamins daily.  8.  Aspirin daily.   FOLLOWUP:  He is to follow up for a pro time next week.  He is to call if  there are problems.      Georga Hacking, M.D.  Electronically Signed     WST/MEDQ  D:  10/21/2005  T:  10/22/2005  Job:  [id number]   cc:   Marcene Duos, M.D.  Fax: [phone]

## 2010-10-24 NOTE — Consult Note (Signed)
[name], [name] NO.:  [id number]   MEDICAL RECORD NO.:  [id number]          PATIENT TYPE:  INP   LOCATION:  4741                         FACILITY:  [facility]   PHYSICIAN:  Salvatore Decent. Dorris Fetch, M.D.DATE OF BIRTH:  1945-07-23   DATE OF CONSULTATION:  06/11/2006  DATE OF DISCHARGE:                                 CONSULTATION   REASON FOR CONSULTATION:  Thrombosis of prosthetic mitral valve.   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  He also has a history of coronary artery disease, cardiomyopathy  with ejection fraction of 25-30%, and ethanol abuse.   [name] has had a chronic history of difficulty with compliance and  management of his anticoagulation.  He has been on Coumadin since the  time of his valve implant.  He is not a reliable historian but according  to Dr. Donnie [name], he has been very difficult to manage with that.  He has  had multiple embolic events, including multiple previous strokes as well  as emboli to the extremities and intra-abdominal complications secondary  to emboli requiring multiple previous operations.  He also has a history  of atrial fibrillation and has had a permanent pacemaker placed  following an AV nodal ablation.   [name] presented on June 07, 2006, with left-sided weakness.  He was admitted with a probable right brain cerebrovascular accident.  A  CT of the head showed multiple old strokes but no acute stroke was  noted.  He had carotid Dopplers performed which showed no significant  carotid disease.  He did have improvement of his left-sided weakness.  He had a transthoracic echocardiogram which showed possible mitral  stenosis with an abnormal jet.  He, today, underwent transesophageal  echocardiogram which showed that the prosthetic mitral valve has  thrombus at the valve __________  causing partial  valve obstruction.  His calculated valve area was 0.8-cm2.  He also had a thrombus in his  left atrial appendage, trivial mitral regurgitation, aortic  insufficiency, and tricuspid regurgitation.  He had moderate to severe  left ventricular dysfunction with an ejection fraction estimated 35% by  echo.   The patient on questioning does state that he gets short of breath with  exertion and this has worsened recently.   MEDICATIONS:  On admission were:  1. Coumadin 7.5 mg Monday, Wednesday, and Friday and 5 mg on other      days.  2. Aspirin 81 mg daily.  3. Coreg 12.5 mg b.i.d.  4. Lasix b.i.d.  5. Quinapril 10 mg daily.  6. Crestor 20 mg daily.  7. Zetia 10 mg daily.   HE HAS NO KNOWN DRUG ALLERGIES.   PAST MEDICAL HISTORY:  Notable for:  1. Mitral valve replacement in 1982.  2. Multiple embolic strokes.  3. Multiple laparotomies for emboli to his intra-abdominal organs.  4. Multiple extremity emboli.  5. Noncompliance with Coumadin.  6. Atrial fibrillation and flutter, requiring AV nodal ablation and      permanent pacemaker  placement.  7. Previous stroke.  8. History of ethanol abuse.  9. Remote history of tobacco abuse.  10.He also has a history of hypertension.  11.Hyperlipidemia.  12.Ventral hernia.   FAMILY HISTORY:  Father had coronary disease.   SOCIAL HISTORY:  He is retired.  He is disabled.  He is divorced and  lives alone.  He does have children in the area.  He does have a prior  history of alcohol abuse which was quite severe, remote history tobacco  abuse.   REVIEW OF SYSTEMS:  Wears glasses, has dentures, dyspnea on exertion,  history of pneumonia, hernia, history of hematuria, previous stroke  called that caused him impaired speech.  He had to learn to speak over  again.  Previous psychiatric hospitalizations.  All other systems  negative.   PHYSICAL EXAMINATION:  GENERAL:  [name] is a 65 year old gentleman  in no acute distress.  He is mildly  disheveled but no acute distress.  VITAL SIGNS:  His blood pressure is 98/60, pulse is 69 and paced.  His  respirations are 18.  His oxygen saturation is 97% on room air.  His  weight is 160 pounds.  HEENT:  He is wearing glasses.  He is edentulous.  His speech is  somewhat slurred.  NEUROLOGIC:  Otherwise unremarkable.  NECK:  Supple without thyromegaly, adenopathy or bruits.  CARDIAC:  Regular rate and rhythm.  There is a valve click.  There is no  obvious murmur.  ABDOMEN:  Soft and nontender.  He has a large ventral hernia just above  the umbilicus.  EXTREMITIES:  Well-perfused.  He has palpable pulses.  SKIN:  Warm and dry.   LABORATORY DATA:  He had CT as previously mentioned.  His INR yesterday  was 22.5, with a PT of 28.3. His white count is 11.4, platelets 204,  hematocrit is 45.  Sodium 136, potassium 4.5, BUN and creatinine are 31  and 1.4, glucose is 102.  Albumin is 3.8.  Triglycerides were 310.  His  cholesterol is 150, HDL was 36.  Cardiac enzymes were negative.   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  This is a Bjork-Shiley  prosthesis from 1982.  He has multiple other medical issues related to  both his prior valve replacement as well as alcoholism and medical  noncompliance.   I agree with Dr. Donnie [name] that this valve does need to be replaced.  There  is significant mitral stenosis.  Another option would be thrombolytics  but that has been demonstrated to have excessive risk of embolic events  in the setting of prosthetic valves.  He obviously would be a high risk  surgical candidate due to his recent neurologic event, but I still feel  that this is best option.   I agree with Dr. Donnie [name] as planned for cardiac catheterization once his  INR normalizes.  He will then need a right heart cath as well at that time.  I think in his case, given his multiple embolic events with a  mechanical prosthesis,  that a pericardial prosthetic valve would be the  best option.  There is of course a risk of prosthetic valve failure  given his age, however, he is very fortunate not to be more severely  disabled or not to had a fatal event over the past  25 years with his multiple embolic events.  I strongly suspect he will  also need coronary bypass  grafting, given he has a chronically totally  occluded right coronary and also has a stent in the left circumflex, but  we will await the findings of cardiac catheterization.  Thank you very  much.           ______________________________  Salvatore Decent. Dorris Fetch, M.D.     SCH/MEDQ  D:  06/11/2006  T:  06/11/2006  Job:  [id number]   cc:   Georga Hacking, M.D.  Michael L. Thad Ranger, M.D.

## 2010-10-24 NOTE — H&P (Signed)
NAME:  [name], [name] NO.:  [id number]   MEDICAL RECORD NO.:  [id number]          PATIENT TYPE:  INP   LOCATION:  1825                         FACILITY:  [facility]   PHYSICIAN:  Darden Palmer., M.D.DATE OF BIRTH:  Oct 25, 1945   DATE OF ADMISSION:  07/08/2004  DATE OF DISCHARGE:                                HISTORY & PHYSICAL   REASON FOR ADMISSION:  Abdominal pain, atrial flutter.   HISTORY:  The patient is a 65 year old male who has a prior complex past  medical history. He has a history of a prosthetic Bjork-Shiley mitral valve  and has had multiple recurrent admissions. He has had significant abdominal  pain and atrial flutter. He has had a history of multiple emboli from his  previous Bjork-Shiley valve due to difficulty with anticoagulation issues in  the past. He also has a history of ongoing cigarette abuse, hyperlipidemia,  and a prior history of coronary artery disease with a previous stent of the  circumflex several years ago. He has had history of strokes, lower extremity  and abdominal pain. He also had a prior atrial flutter ablation and has had  multiple cardioversions, the last being in December. His last admission was  in December with atrial flutter.  He was admitted with abdominal pain and  atrial flutter that converted in August 2005 previously. He woke this  morning with abdominal pain and presented to the emergency room around 5:30.  Initial cardiac enzymes were negative. He initially complained of abdominal  pain and had a distended abdomen. Initial abdominal surgery was  unremarkable.   Laboratory work was unremarkable except for a subtherapeutic protime of 1.2.  He had previously been therapeutic in the office, but was subtherapeutic  this past week and also does use vitamin K rich foods at times. He has had  some exacerbations of congestive heart failure previously and has had  increased Lasix also previously. He was complaining of  some mild shortness  of breath. He is admission at this time for treatment of abdominal pain and  was also found to have atrial flutter and had a rapid response when he came  in.   His past history is complex. He has a past history of an ischemic right leg  secondary to suspected embolus. He has history of rheumatic fever, atrial  flutter, hyperlipidemia, coronary artery disease, history of previous  strokes, mesenteric emboli, and arterial emboli to his legs.   PAST SURGICAL HISTORY:  Mitral valve prolapse. He has had previous  exploratory laparotomy and has also had embolectomy of his right leg  previously.   ALLERGIES:  None.   MEDICATIONS:  1.  Quinapril 20 mg daily.  2.  Amiodarone 200 mg b.i.d.  3.  Vytorin 10/40 mg daily.  4.  Coumadin 5 mg Monday, Wednesday, and Friday; 2.5 mg on alternate days.  5.  Aspirin 81 mg daily.  6.  Coreg 6.25 mg b.i.d.  7.  Lasix 40 mg b.i.d.   SOCIAL HISTORY:  Divorced. He is retired from [employer]. He has a history of  psychiatric problems which may  in part be related to his previous stroke. He  currently lives alone. He has had intermittent smoking previously and states  that he has quit now. There may be some alcohol abuse, but very difficult to  tell.   FAMILY HISTORY:  Positive for rheumatic fever without premature coronary  artery disease.   REVIEW OF SYSTEMS:  Denies significant weight loss. He wears glasses. He has  seborrheic dermatitis of his face. No difficulty swallowing. He has felt  well and his bowel movement was at 1 a.m. this morning. He has not had any  recent diarrhea. He denies any abdominal pain. He does not have any angina  or hemoptysis. There is no recent neurologic symptoms other than his  previous stroke as noted above. The remainder of his review of systems is  unremarkable.   PHYSICAL EXAMINATION:  GENERAL: He is a slightly obese male, appearing  slightly older than stated age.  VITAL SIGNS: Blood pressure  currently 110/70. He was evidently hypertensive  when he came in. Pulse is currently 70 with atrial flutter.  SKIN: Seborrheic dermatitis of her face. Wears glasses.  HEENT: EOMI. PERRLA. Sclerae clear. Fundi not seen. Pharynx negative.  NECK: Supple without masses. No JVD, thyromegaly, or bruits.  LUNGS: Clear.  CARDIAC: Normal S1. Valve clicks are well heard. There is no S3. No definite  murmur heard.  ABDOMEN: Distended. Midline scar is noted. Possible ventral hernia. The  abdomen is firm with diffuse tympany noted, but no rebound.  EXTREMITIES: Femoral pulses are 2+. There is diminished posterior tibial and  dorsalis pedis on the right. The posterior tibial is 2+ on the left, slight  amount of edema.  NEUROLOGIC: Normal.   Laboratory work showed a white count of 11,900, normal CBC. Chemistry panel  is normal with slight elevation of creatinine of 1.5.   Abdominal x-ray shows no acute changes. The chest x-ray shows some scarring  atelectasis and cardiomegaly.   IMPRESSION:  1.  Abdominal pain.  2.  Atrial flutter, recurrent, with prior history of ablation.  3.  Coronary artery disease with previous stent in circumflex and      nonocclusion of the right coronary artery.  4.  Cardiomyopathy.  5.  History of multiple arterial emboli due to suboptimal anticoagulation      and noncompliance.  6.  History of mesenteric emboli.  7.  Hyperlipidemia, under treatment.  8.  Medical noncompliance.   RECOMMENDATIONS:  He is subtherapeutic on his Coumadin. I recommend that he  be initiated heparin and he will have a CT scan of his abdomen. Begin clear  liquid diet, continue medications, and he may need to have additional  cardioversion. Because of the recurrence of atrial flutter, we may consider  referral to an electrophysiology specialist for consideration of  radiofrequency ablation to try to limit the recurrences of atrial  arrhythmias.   WST/MEDQ  D:  07/08/2004  T:  07/08/2004   Job:  [id number]

## 2010-10-24 NOTE — Discharge Summary (Signed)
[facility]  Patient:    RUVEN, CORRADI Visit Number: [id number] MRN: [id number]          Service Type: MED Location: [phone] Attending Physician:  Koren Bound Dictated by:   Darden Palmer., M.D. Admit Date:  03/12/2001 Discharge Date: 03/19/2001                             Discharge Summary  FINAL DIAGNOSES: 1.  Unstable angina pectoris, subendocardial infarction initial episode:     a.  Stenting of the circumflex done this admission.     b.  Occluded right coronary artery. 2.  Congestive heart failure. 3.  Prosthetic Bjork-Shiley mitral valve disease. 4.  History of stroke. 5.  History of noncompliance with Coumadin therapy. 6.  Cigarette abuse.  PROCEDURES: 1.  Cardiac catheterization. 2.  Angioplasty. 3.  Stenting of the circumflex.  HISTORY OF PRESENT ILLNESS:  This is a 65 year old male who had a prosthetic Bjork-Shiley mitral valve placed in 1982.  He has had recurrent strokes when he would not take his Coumadin properly over the years.  Compliance has been a difficult problem for him.  He has continued to smoke cigarettes.  He was also found to have some congestive heart failure with low ejection fraction last year also.  He was admitted to the hospital the day of admission with chest discomfort which began at work on the morning of admission which described as severe associated with diaphoresis and shortness of breath. Nitroglycerin relieved his pain.  He was admitted to the hospital to rule out a myocardial infarction.  Please see the previously dictated history and physical for remainder of the details.  HOSPITAL COURSE:  LABORATORY DATA:  INR was 2.6 on admission.  White blood cell count was 12,400, hemoglobin 13.9, hematocrit 41.3.  Chemistry panel was normal. CPK was 199 with 4.8 MB, troponin level was 0.05.  Troponin rose to 0.22, CPK dropped to 187 and gradually came down afterwards.   Cholesterol was 224 with triglycerides of 306, HDL 38 and ratio of 5.9.  ELECTROCARDIOGRAM:  The electrocardiogram showed atrial fibrillation with IV conduction delay with nonspecific ST-T wave abnormalities.  X-RAYS:  Chest x-ray showed a prosthetic heart valve.  HOSPITAL COURSE:  The patient was admitted to the hospital, was in atrial fibrillation and flutter on admission.  He was treated with intravenous heparin and his Coumadin was withheld.  By 03/14/01 his protime had improved to the point where he could undergo catheterization.  The catheterization was done and left ventriculogram showed severe hypokinesis with an ejection fraction of 25%.  The left main coronary artery had mild distal disease.  The LAD had mild disease.  Circumflex had an eccentric severe 95% proximal stenosis.  The right coronary artery was subtotally occluded and thrombus in the left coronary artery.  He underwent stenting of the circumflex with two 8 mm by 3 mm Zeta stents, dilated to 13 atmospheres. He tolerated the procedure well.  Following the procedure, because of his heart valve, he was placed back on heparin until his Coumadin became subtherapeutic.  He was also restarted on amiodarone therapy and he was instructed in smoking cessation.  He was placed on ACE inhibitors.  He was also placed on beta blockers and Diltiazem for control of his heart rate.  He had felt well and by 03/19/2001  was able to be discharged home when his  protime INR became therapeutic at 2.6.  He is discharged at this time in improved condition.  DISCHARGE MEDICATIONS: 1.  Lanoxin 0.25 mg daily. 2.  Zocor 20 mg daily. 3.  Amiodarone 400 mg q.d. 4.  Accupril 10 mg daily. 5.  Plavix 75 mg daily. 6.  Aspirin 81 mg daily. 7.  Coumadin 5 mg daily. 8.  Cardizem CD, 120 mg daily.  The patient was not on beta blockers because of significant low heart rate toward the end of his admission.  FOLLOW UP :  Patient is to be see in one  week.  He was instructed to be seen for protime check in one week.  He is to call if there are problems. Dictated by:   Darden Palmer., M.D. Attending Physician:  Koren Bound DD:  04/25/01 TD:  04/25/01 Job: [id number]

## 2010-10-24 NOTE — Op Note (Signed)
NAME:  TAYLEN, OSORTO NO.:  [id number]   MEDICAL RECORD NO.:  [id number]                   PATIENT TYPE:  OIB   LOCATION:  2036                                 FACILITY:  [facility]   PHYSICIAN:  Doylene Canning. Ladona Ridgel, M.D.               DATE OF BIRTH:  [birth year]   DATE OF PROCEDURE:  01/23/2003  DATE OF DISCHARGE:                                 OPERATIVE REPORT   PROCEDURE PERFORMED:  Invasive electrophysiologic study and radiofrequency  catheter ablation of atrial flutter.   INDICATIONS:  Recurrent and persistent atrial flutter in the setting of  prior mitral valve replacement.   INTRODUCTION:  The patient is a very pleasant middle-aged with a history of  recurrent atrial flutter and a history of mitral valve disease, status post  mitral valve replacement.  He was initially found to have a left atrial  thrombus by transesophageal echo.  He has subsequently been therapeutically  anticoagulated and returns today for atrial flutter ablation.   DESCRIPTION OF PROCEDURE:  After informed consent was obtained, the patient  was taken to the diagnostic EP lab in a fasting state.  After the usual  preparation and draping, intravenous fentanyl and midazolam were given for  sedation.  A 6 French hexapolar catheter was inserted percutaneously into  the right jugular vein and advanced to the coronary sinus.  A 7 French 20-  pole halo catheter was inserted percutaneously in the right femoral vein and  advanced to the right atrium.  A 5 French quadripolar catheter was inserted  percutaneously in the right femoral vein and advanced to the His bundle  region.  After measurement of the basic intervals, mapping was carried out  demonstrating typical counterclockwise atrial flutter with a cycle length of  236 msec.  The HV interval was 49 msec.  The ablation catheter was  maneuvered into the usual atrial flutter isthmus.  Prior to this, pacing at  a cycle length of 210 msec  demonstrates concealed entrainment.  Three RF  energy applications were delivered to the usual atrial flutter isthmus,  resulting in termination of atrial flutter and restoration of sinus rhythm.  Pacing from the coronary sinus demonstrated bidirectional block in the  atrial flutter isthmus.  Eight bonus RF energy applications were then  delivered and the patient was observed for 30 minutes.  During this time  there was no evidence of any return of his atrial flutter isthmus  conduction.  The catheter was then removed, hemostasis assured, and the  patient returned to his room in satisfactory condition.   COMPLICATIONS:  There were no immediate procedural complications.   RESULTS:  A.  BASELINE ELECTROCARDIOGRAM:  The baseline ECG demonstrates  atrial flutter with variable AV conduction.   B.  BASELINE INTERVALS:  The HV interval was 49 msec, atrial flutter cycle  length 236 msec.   C.  RAPID ATRIAL PACING:  Rapid atrial  pacing following catheter ablation  demonstrated an AV Wenckebach cycle length of 410 msec.  Rapid atrial pacing  prior to ablation at a cycle length of 210 msec demonstrated concealed  entrainment.   D.  PROGRAMMED ATRIAL STIMULATION:  Programmed atrial stimulation was  carried out through the coronary sinus at a basic drive cycle length of 604  msec.  The S1-S2 interval was stepwise decreased from 540 msec down to 320  msec, where the AV node ERP was observed.  During programmed atrial  stimulation there were no AH jumps and no echo beats and no inducible SVT.   E.  ARRHYTHMIAS OBSERVED:  Atrial flutter.  Initiation:  Present at time of  EP study.  Duration:  Sustained.  Cycle length:  236 msec.  Method of  termination:  Catheter ablation.   F.  MAPPING:  Mapping of the patient's atrial flutter demonstrated typical  counterclockwise tricuspid annular re-entry.   G.  RADIOFREQUENCY ENERGY APPLICATION:  A total of 11 RF energy applications  were delivered.  During  the third RF energy application atrial flutter was  terminated and sinus rhythm restored.  Pacing demonstrated bidirectional  block.  Eight bonus RF energy applications were delivered, and the patient  was observed for 30 minutes and had no evidence of recurrent atrial flutter  isthmus conduction.   CONCLUSION:  This study demonstrates successful electrophysiologic study and  radiofrequency catheter ablation of typical counterclockwise tricuspid  annular reentrant atrial flutter with a total of 11 radiofrequency energy  applications resulting in termination of atrial flutter, restoration of  sinus rhythm, and creation of bidirectional block in the atrial flutter  isthmus.                                                Doylene Canning. Ladona Ridgel, M.D.    GWT/MEDQ  D:  01/23/2003  T:  01/23/2003  Job:  [id number]   cc:   W. Ashley Royalty., M.D.  [address]  [place]  [place] [address]  Fax: [phone]   Kathrine Cords, R.N. [facility]
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
## 2010-10-24 NOTE — H&P (Signed)
NAME:  [name] NO.:  [id number]   MEDICAL RECORD NO.:  [id number]                   PATIENT TYPE:  INP   LOCATION:  1828                                 FACILITY:  [facility]   PHYSICIAN:  Georga Hacking, M.D.             DATE OF BIRTH:  1945-08-14   DATE OF ADMISSION:  07/02/2003  DATE OF DISCHARGE:                                HISTORY & PHYSICAL   HISTORY OF PRESENT ILLNESS:  [name] is a 65 year old white male who  is admitted to [facility] for further evaluation of chest pain.  In  addition, he was found to be in the atrial flutter in the emergency  department.   The patient has a history of mitral valve replacement in the past.  In  addition, he has experienced recurrent atrial flutter.  In August of 2004,  he underwent atrial flutter ablation.  It appears that he was discharged in  sinus rhythm.  The patient presented to the emergency department by EMS  tonight after experiencing chest pain which began at home.  This was  described as a pressure in the center of his chest as if someone were  standing on him.  It did not radiate but it was associated with numbness in  his left hand.  There was no associated dyspnea, diaphoresis, or nausea.  There were no exacerbating or ameliorating factors.  It appeared not to be  related to position, activity, meals, or respirations.  It continued until  his arrival at the emergency department; the total duration of chest  discomfort was approximately 1 hour.  His chest discomfort has largely  resolved at this time.   He reports no palpitations, irregular heart beat, fast heart beat, skipped  heart beats, dizziness, lightheadedness, presyncope, or syncope.   There is no history of coronary artery disease or myocardial infarction.   The patient has had a left atrial thrombus in the past which was treated  with anticoagulation.  In addition, he has a history of a DVT.  He is also  status  post multiple cerebrovascular accidents.  In addition, there is a  history of dyslipidemia and hiatal hernia.   CURRENT MEDICATIONS:  1. Amiodarone 200 mg p.o. daily.  2. Coreg 6.25 mg p.o. b.i.d.  3. Zocor 20 mg p.o. daily.  4. Accupril 10 mg p.o. daily.  5. Lasix 40 mg p.o. daily.  6. Coumadin.   ALLERGIES:  He is not allergic to any medications.   SOCIAL HISTORY:  The patient lives alone.  He is retired.  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   The patient continues to smoke.  He drinks 2 to 3 beers per week.   FAMILY HISTORY:  His mother died of coronary artery disease in her 33s.  His  father died of coronary artery disease in his 26s.  He has a brother  who  died in his 59s of an unknown heart ailment.   PREVIOUS OPERATIONS:  In addition to his mitral valve replacement, the  patient has undergone some abdominal operation in the remote past.   SIGNIFICANT INJURIES:  None.   REVIEW OF SYSTEMS:  Review of systems demonstrated no new problems related  to his head, eyes, ears, nose, mouth, throat, lungs, gastrointestinal  system, genitourinary system, or extremities.  There is no history of  neurologic or psychiatric disorder (other than as described above).  There  is no history of fever, chills, or weight loss.   PHYSICAL EXAMINATION:  VITAL SIGNS:  Blood pressure 104/56.  Pulse 96 and  irregularly irregular.  Respirations 18.  Temperature 97.0.  GENERAL:  The patient was a middle-aged white man in no discomfort.  He was  alert, oriented, appropriate, and responsive.  HEENT:  Head, eyes, nose, and mouth were normal.  NECK:  The neck was without thyromegaly or adenopathy.  Carotid pulses were  palpable bilaterally and without bruits.  CARDIAC:  Examination revealed crisp prosthetic valve sounds.  There was no  murmur, rub, or click.  Cardiac rhythm was irregularly irregular.  LUNGS:  The lungs were clear.  ABDOMEN:  The abdomen was soft and  nontender.  There was no mass,  hepatosplenomegaly, bruit, distention, rebound, guarding, or rigidity.  Bowel sounds were normal.  RECTAL AND GENITAL:  Examinations were not performed as they were not  pertinent to the reason for acute care hospitalization.  EXTREMITIES:  The extremities were without edema, deviation, or deformity.  Radial and dorsalis pedal pulses were palpable bilaterally.  NEUROLOGIC:  Brief screening neurologic survey was unremarkable.   LABORATORY AND ACCESSORY CLINICAL DATA:  The electrocardiogram revealed  atrial flutter with variable A-V conduction and a net ventricular rate of  105 beats per minute.  There were nonspecific ST and T wave changes as well  as a nonspecific intraventricular conduction delay.   The chest radiograph was pending at the time of this dictation.   Potassium was 4.3, BUN 18, and creatinine pending.  The initial set of  cardiac markers revealed a myoglobin of 61.2, CK-MB 1.2 and troponin less  than 0.05.  The remaining studies were pending at the time of this  dictation.   IMPRESSION:  1. Chest pain, rule out cardiac ischemia.  2. Recurrent atrial flutter; status post atrial flutter ablation, August     2004.  3. Status post mitral valve replacement.  4. Status post left atrial thrombus.  5. Status post deep venous thrombosis.  6. History of hiatal hernia.  7. Dyslipidemia.  8. Status post cerebrovascular accident.   PLAN:  1. Telemetry.  2. Serial cardiac enzymes.  3. Intravenous diltiazem.  4. Continue Coumadin.  5. Discontinue smoking.  6. Further measures per Dr. Lacretia Nicks. Viann Fish.      Quita Skye. Waldon Reining, MD                   Georga Hacking, M.D.    MSC/MEDQ  D:  07/02/2003  T:  07/02/2003  Job:  [id number]   cc:   Darden Palmer., M.D.  [address], [address]  [place]  [place] [address]  Fax: [phone]

## 2010-10-24 NOTE — H&P (Signed)
[place]  Patient:    [name], [name] Visit Number: [id number] MRN: [id number]          Service Type: MED Location: [id number] Attending Physician:  Koren Bound Dictated by:   Alvia Grove., M.D. Admit Date:  03/12/2001   CC:         Darden Palmer., M.D.   History and Physical  HISTORY:  Mr. Pileggi is a 64 year old gentleman with a history of atrial fibrillation, mitral valve replacement and a cigarette smoker.  He is admitted with an episode of chest pain.  Mr. Lambert has a history of mitral stenosis and is status post prosthetic mitral valve replacement.  He has been on Coumadin since his valve replacement in 1982.  He has had intermittent atrial fibrillation which has been fairly well-controlled on amiodarone.  The patient has not had any significant episodes of chest pain.  The patient was at work this morning and started having episodes of chest discomfort.  The chest pain was fairly severe.  It was associated with diaphoresis and shortness of breath.  EMS was called and he was given a nitroglycerin with prompt relief of the chest pain.  He was brought to the emergency room for further evaluation.  The patient denies any recent episodes of chest pain or shortness of breath. he has been able to do all of his usual daily activities.  CURRENT MEDICATIONS: 1. Cordarone 200 mg a day. 2. Coumadin 5 mg a day. 3. Cardizem CD 120 mg a day. 4. Lanoxin 0.25 mg a day. 5. Toprol-XL 25 mg a day.  ALLERGIES:  He has no known drug allergies.  PAST MEDICAL HISTORY: 1. Mitral stenosis -- status post mitral valve replacement. 2. Intermittent atrial fibrillation.  SOCIAL HISTORY:  The patient smokes one pack a day.  He drinks alcohol occasionally.  He works at [employer] as a Curator.  He does not get any regular exercise and does not follow any specific diet.  FAMILY HISTORY:  His family history is positive  for coronary artery disease. His father died of a myocardial infarction at age 33.  His mother died at age 49 due to "cardiac problems."  His brother died at age 64 due to rheumatic fever.  REVIEW OF SYSTEMS:  His review of systems was reviewed.  He denies any heat or cold intolerance, weight gain or weight loss.  He denies any problems with his eyes, ears, nose and throat.  He denies any previous episodes of chest pain prior to today.  He denies any significant shortness of breath.  He denies any change in his bowel habits or change in his urinary habits.  His review of systems was otherwise negative.  PHYSICAL EXAMINATION:  GENERAL:  He is a middle-aged gentleman in no acute distress.  He is currently pain free.  VITAL SIGNS:  Heart rate initially was 138 and was down to 65 after IV beta blocker.  His blood pressure is 133/64.  He is afebrile.  HEENT/NECK:  Carotids 2+.  There are no bruits.  There is no JVD.  His thyroid is not enlarged.  LUNGS:  Clear.  HEART:  Irregularly irregular.  He has a mechanical S1 with a normal S2.  ABDOMEN:  Exam reveals good bowel sounds and is nontender.  He has no hepatosplenomegaly and no bruits.  EXTREMITIES:  He has no clubbing, cyanosis, or edema.  There is no calf tenderness.  His pulses are  intact.  NEUROLOGIC:  Exam is nonfocal.  His cranial nerves II-XII are intact and his motor and sensory function are intact.  LABORATORY DATA:  His EKG reveals atrial fibrillation with nonspecific ST and T wave abnormalities.  ASSESSMENT AND PLAN:  The patient presents with chest pain.  The chest pain is consistent with unstable angina.  It was relieved fairly promptly with sublingual nitroglycerin.  He has several risk factors including cigarette smoking and a family history of coronary artery disease.  We will admit him and reverse his Coumadin.  I anticipate that he will need a heart catheterization.  We will check cardiac enzymes.  Another  option would be to perform a Cardiolite study.  Dr. Georga Hacking, Montez Hageman. will see him on Monday for further evaluation. Dictated by:   Alvia Grove., M.D. Attending Physician:  Koren Bound DD:  03/12/01 TD:  03/13/01 Job: [id number]

## 2010-10-24 NOTE — Discharge Summary (Signed)
[name], [name] NO.:  [id number]   MEDICAL RECORD NO.:  [id number]          PATIENT TYPE:  INP   LOCATION:  4731                         FACILITY:  [facility]   PHYSICIAN:  Georga Hacking, M.D.DATE OF BIRTH:  [birth year]   DATE OF ADMISSION:  07/07/2005  DATE OF DISCHARGE:  07/10/2005                                 DISCHARGE SUMMARY   FINAL DIAGNOSIS:  1.  Atrial fibrillation and flutter, resolved.  2.  Prosthetic mitral valve.  3.  Subtherapeutic Coumadin anticoagulation.  4.  Hyperlipidemia.  5.  Coronary artery disease with previous stent.  6.  History of recurrent mesenteric and arterial emboli.  7.  Permanent pacemaker.   PROCEDURES:  Cardioversion.   HISTORY:  65 year old male with history of recurrent atrial fibrillation and  flutter.  He has an ejection fraction of 25% with cardiomyopathy and a  previous stent to the circumflex coronary artery.  He has hypertension as  well as paroxysmal atrial fibrillation and flutter.  He has been managed  previous with amiodarone and also a permanent pacemaker to help with  antitachycardia pacemaking.  He has had issues with noncompliance in the  past.  He presented with worsening shortness of breath and was found to be  in rapid atrial flutter and fibrillation.  He was found to have worsening  heart failure and was admitted to rule out a myocardial infarction and to  treat heart failure and atrial fibrillation.  Please see the previously the  previous dictated history and physical for remainder of the details.   HOSPITAL COURSE:  Chest x-ray shows cardiomegaly with bilateral pleural  scarring, reticular nodular changes noted, previous permanent pacemaker.  Laboratory data shows normal CBC.  Pro time and INR was subtherapeutic on  admission at 1.5.  Sodium is 141, potassium 4.8, chloride 113, glucose 110,  CO2 23, BUN 14, creatinine 1.3, glucose was 115.  CPK-MBs were negative.  Troponin was 0.05.   Patient  was admitted and started on treatment with Lovenox and his Coumadin  dose was increased.  He was placed on Diltiazem, but had some mild  hypotension associated with this.  His rate did slow and he was continued on  his other medications.  B-natriuretic peptide was 462.  Amiodarone level was  drawn but the results were pending at the time of dictation.  TSH was 1.12.   The patient was cardioverted on July 09, 2005 with reversion to sinus  rhythm.  His pro time/INR had extended to 2.1 and he was stable and feeling  well and was discharged home in improved condition.   DISCHARGE MEDICATIONS:  1.  Coumadin 7.5 mg daily.  2.  Amiodarone 200 mg two times per day.  3.  Coreg 6.25 mg twice daily.  4.  Crestor 10 mg daily.  5.  Lasix 40 mg b.i.d.  6.  Lisinopril 10 mg daily.  7.  Prenatal vitamins daily.  8.  Aspirin 81 mg daily.   It is recommended that he have a follow-up pro time on Tuesday February 6.  He is to walk daily and is  not to use alcohol.  Follow up in one week.      Georga Hacking, M.D.  Electronically Signed     WST/MEDQ  D:  07/10/2005  T:  07/10/2005  Job:  [id number]   cc:   Marcene Duos, M.D.  Fax: [phone]
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
## 2010-10-24 NOTE — Op Note (Signed)
[name], [name] NO.:  [id number]   MEDICAL RECORD NO.:  [id number]          PATIENT TYPE:  INP   LOCATION:  4729                         FACILITY:  [facility]   PHYSICIAN:  Georga Hacking, M.D.DATE OF BIRTH:  [birth year]   DATE OF PROCEDURE:  09/25/2005  DATE OF DISCHARGE:                                 OPERATIVE REPORT   PROCEDURE:  Direct current cardioversion.   CARDIOLOGIST:  Georga Hacking, M.D.   INDICATIONS:  Recurrent atrial fibrillation.   DESCRIPTION OF PROCEDURE:  The patient was n.p.o. after midnight and had an  INR of 3.2.  Anesthesia was administered by Dr. Diamantina Monks with 125 mg of  Pentothal IV.  Cardioversion was done with synchronized DC biphasic  cardioversion with AP pads using 120 watts.  The patient reverted to sinus  rhythm on the first shock.   IMPRESSION:  Successful cardioversion of atrial fibrillation.      Georga Hacking, M.D.  Electronically Signed     WST/MEDQ  D:  09/25/2005  T:  09/25/2005  Job:  [id number]   cc:   Marcene Duos, M.D.  Fax: [phone]

## 2010-10-24 NOTE — Op Note (Signed)
[name], [name] NO.:  [id number]   MEDICAL RECORD NO.:  [id number]          PATIENT TYPE:  INP   LOCATION:  2922                         FACILITY:  [facility]   PHYSICIAN:  Darden Palmer., M.D.DATE OF BIRTH:  1945/07/11   DATE OF PROCEDURE:  07/10/2004  DATE OF DISCHARGE:                                 OPERATIVE REPORT   PROCEDURE PERFORMED:  Cardioversion of atrial flutter.   HISTORY:  A 65 year old male who has had recurrent atrial flutter despite  amiodarone.  He has had a previous atrial flutter ablation on the right  side.  He presented with pulmonary edema and also has depressed left  ventricular function.   DESCRIPTION OF PROCEDURE:  Cardioversion:  The patient was n.p.o. after  midnight and anesthesia was administered by Dr. Diamantina Monks with 20 mg of  Amidate.  Cardioversion was done with synchronized biphasic defibrillation  with 50 W resulting in reversion to sinus rhythm after one shock.   IMPRESSION:  Successful cardioversion of atrial flutter.      WST/MEDQ  D:  07/10/2004  T:  07/10/2004  Job:  [id number]

## 2010-10-24 NOTE — H&P (Signed)
[place]. [facility]  Patient:    DENNEY, SHEIN Visit Number: [id number] MRN: [id number]          Service Type: MED Location: [phone] Attending Physician:  Norman Clay Dictated by:   Clovis Pu Patty Sermons, M.D. Admit Date:  12/08/2001   CC:         Darden Palmer., M.D.   History and Physical  CHIEF COMPLAINT: Shortness of breath.  HISTORY OF PRESENT ILLNESS: This is a 65 year old Caucasian gentleman, admitted with acute dyspnea and new onset of rapid atrial flutter.  The patient has a history of rheumatic mitral stenosis.  He was first seen in our practice on April 26, 1974.  He underwent Bjork-Shiley mitral valve replacement in 1982 by Dr. Micah Noel.  He was subsequently lost to follow-up and represented himself about 1990 after having had a cerebral embolus due to inadequate anticoagulation.  Since then he has been followed closely by Dr. Lacretia Nicks. Viann Fish, although his medical compliance is less than optimal.  In fact, he had an appointment in our office yesterday which he missed.  Tonight at work the patient had sudden onset of severe dyspnea.  He was not aware that his heart was racing and he did not have any chest pain.  He was markedly dyspneic and went to the nurse at [facility] [employer] where he works, and she did a rhythm strip and noted tachycardia.  EMS was called.  They gave him a trial of IV Adenocard without conversion, though transiently slowed enough to see that he was in atrial flutter.  He then came to the [facility] Room for further evaluation.  The patient does have a past history of paroxysmal atrial fibrillation and was admitted in April 2003 and required DC cardioversion at that time.  CURRENT MEDICATIONS:  1. Cordarone 200 mg q.d.  2. Coumadin 5 mg q.d. except 7.5 mg on Tuesdays and Thursdays.  His last     INR on Nov 04, 2001 was 2.4.  3. Lanoxin 0.25 mg q.d.  4. Accupril  10 mg q.d.  5. Zocor 20 mg q.d.  6. Lasix 40 mg q.d.  SOCIAL HISTORY: He is divorced.  He has two children in their 30s, living and well.  He has been a Copy at [employer].  He drinks an occasional alcoholic drink.  He does admit to smoking a pack of [place] cigarettes each day. He gets one pack for free.  FAMILY HISTORY: He had an older brother who died of complications of rheumatic fever at age 72.  He has a sister and a brother who are living, who do not have rheumatic fever or rheumatic heart disease.  His father died at age 62 of a heart attack.  His mother died in her 58s of a CVA.  ALLERGIES: No known drug allergies.  He avoids aspirin because of the Coumadin.  PAST MEDICAL HISTORY: He was hospitalized in January 2002 with abdominal pain and was found to have a mesenteric embolus with small bowel necrosis requiring operation by Dr. Magnus Ivan.  The past medical history is as recorded in the prior hospital records.  REVIEW OF SYSTEMS: GASTROINTESTINAL: No history of any peptic ulcer symptoms. He denies hematochezia or melena.  Normal bowel movements since his small bowel surgery in January 2003 by Dr. Magnus Ivan.  He has a remote history of hematuria but no recent symptoms.  He does have a cough and smokes a pack  a day.  The remainder of the Review Of Systems is negative as noted above.  The patient also has a history of coronary disease with previous stenting of the circumflex in October 2002 and has a past history of noncompliance.  PHYSICAL EXAMINATION:  VITAL SIGNS: Blood pressure 114/61.  Pulse is 150 and initially regular, and after IV Cardizem becomes becomes irregular with clearly defined flutter waves and a ventricular response of 115.  Respirations are increased.  HEENT/NECK: PERRL.  Sclerae clear.  Mouth and pharynx normal.  Dentures in place.  Jugular venous pressure reveals large V waves.  The carotids reveal no bruit.  CHEST: Bilateral wet  rales.  HEART: Good valve clicks.  No S3 gallop.  ABDOMEN:  Soft and nontender.  The incision is well-healed.  EXTREMITIES: No phlebitis or edema.  LABORATORY DATA: Chest x-ray shows cardiomegaly and congestive heart failure. Electrocardiogram shows inferolateral ST-T wave abnormalities.  Laboratory studies include a WBC of 13,600, hemoglobin 13; 69% neutrophils; platelet count 210,000.  Pro time is 30 seconds with an INR of 3.7.  Cardiac enzymes and metabolic panel are not available at the time of this dictation.  IMPRESSION:  1. Paroxysmal atrial flutter.  2. Acute congestive heart failure secondary to #1.  3. Rheumatic heart disease, status post Bjork-Shiley mitral valve     prosthesis in 1982.  4. Chronic Coumadin anticoagulation.  5. Ongoing tobacco abuse.  6. Hyperlipidemia, on Zocor.  7. Coronary artery disease, status post stenting of circumflex in October     2002.  8. Hyperlipidemia.  9. History of medical noncompliance. 10. Remote history of embolic stroke. 11. Past history of mesenteric embolus.  PLAN:  1. Admit to Dr. Georga Hacking.  2. Will treat him with IV Cardizem to control ventricular response.  3. Continue anticoagulation and hold Coumadin until prothrombin time     returns to therapeutic range.  4. Continue amiodarone.  5. The patient may require repeat cardioversion.  6. Will get serial enzymes to rule out myocardial infarction. Dictated by:   Clovis Pu Patty Sermons, M.D. Attending Physician:  Norman Clay DD:  12/08/01 TD:  12/08/01 Job: [id number]

## 2010-10-24 NOTE — H&P (Signed)
NAME:  ILLIAS, PANTANO NO.:  [id number]   MEDICAL RECORD NO.:  [id number]          PATIENT TYPE:  EMS   LOCATION:  MAJO                         FACILITY:  [facility]   PHYSICIAN:  Ulyses Amor, MD DATE OF BIRTH:  08/10/1945   DATE OF ADMISSION:  08/07/2004  DATE OF DISCHARGE:                                HISTORY & PHYSICAL   HISTORY OF PRESENT ILLNESS:  [name] is a 65 year old white male who  is again admitted to [facility] for a recurrence of atrial flutter.   The patient has a complex past history of cardiac disease.  He is status  post mitral valve replacement with a Bjork-Shiley prosthesis for mitral  stenosis.  He has experienced recurrent atrial flutter in the past, and he  has undergone ablation.  He has also undergone multiple cardioversions.  The  last cardioversion was performed one month ago.  He also has a history of  coronary artery disease, having previously undergone stent deployment in his  circumflex.  He also has congestive heart failure.  He has a history of  multiple arterial emboli, including cerebral emboli, lower extremity emboli,  and mesenteric emboli, due to noncompliance with his anticoagulation.  When  the patient presents with atrial flutter, in the past he also notes  abdominal pain.   The patient experienced the onset of a vague right lower quadrant ache  yesterday.  He saw his primary care physician today who determined that his  atrial flutter had recurred, and he was referred to the emergency room.  In  the emergency room, it was confirmed that he was in atrial flutter.  The  patient reports no palpitations with sensation of a rapid or irregular heart  beat.  He has experienced no chest pain, tightness, heaviness, pressure, or  squeezing, nor has there been any dyspnea or diaphoresis.  His appetite has  been good.  He has had no nausea, vomiting, hematemesis, constipation,  diarrhea, hematochezia, or melena.  There has been no  syncope, near syncope,  dizziness, or lightheadedness.   OTHER MEDICAL PROBLEMS:  1.  Dyslipidemia.  2.  Hypertension.  3.  Chronic obstructive pulmonary disease.   MEDICATIONS:  1.  Quinapril 20 mg p.o. daily.  2.  Amiodarone 200 mg p.o. b.i.d.  3.  Vytorin 10/40 p.o. daily.  4.  Coumadin.  5.  Aspirin 85 mg p.o. daily.  6.  Coreg 6.25 mg p.o. b.i.d.  7.  Furosemide 40 mg p.o. b.i.d.   ALLERGIES:  None.   PREVIOUS OPERATIONS:  1.  Mitral valve replacement.  2.  Right inguinal hernia repair.  3.  Vasectomy.  4.  Exploratory laparotomy.  5.  Right popliteal to posterior tibial bypass.  6.  Various embolectomies.   FAMILY HISTORY:  His father died of a myocardial infarction in his 79s.  His  mother died of a myocardial infarction in her 71s.  There is no other  significant family history of medical problems.   SIGNIFICANT INJURIES:  None.   SOCIAL HISTORY:  The patient is divorced.  He lives alone.  He has  smoked in  the past, but is not smoking at this time.  He denies alcohol usage.   REVIEW OF SYSTEMS:  No new problems related to his head, eyes, ears, nose,  mouth, throat, lungs, gastrointestinal system, genitourinary systems, or  extremities.  There is no history of neurologic or psychiatric disorder.  There is no history of fever, chills, or weight loss.   PHYSICAL EXAMINATION:  VITAL SIGNS:  Blood pressure 119/52, pulse 88 and  regular, respirations 23, temperature 98.2.  GENERAL:  The patient was a middle-aged white man in no discomfort.  He was  alert, oriented, and appropriate.  HEENT:  Head, eyes, nose, and mouth were normal.  NECK:  Without thyromegaly or adenopathy.  Carotid pulses were palpable  bilaterally and without bruits.  CARDIAC:  Crisp prosthetic valve sounds.  There was no gallop, murmur, or  rub.  Cardiac rhythm was regular.  No chest wall tenderness was noted.  No  chest wall tenderness was noted.  LUNGS:  Clear.  ABDOMEN:  The abdomen was  soft and nontender.  There was no mass,  hepatosplenomegaly, bruit, distention, rebound, guarding, or rigidity.  Bowel sounds were normal.  RECTAL/GENITAL:  Not performed.  EXTREMITIES:  Without edema, deviation, or deformity.  Radial and dorsalis  pedis pulses were palpable bilaterally.  Brief screening neurologic survey  was unremarkable.   The electrocardiogram revealed atrial flutter with 2:1 AV conduction with a  ventricular rate of 132 beats per minute.  Nonspecific ST-T wave changes  were noted, due in large part to the flutter waves.  The initial set of  cardiac markers revealed a CK-MB of less than 1.0, myoglobin 86.4, and  troponin less than 0.05.  The second set of cardiac markers reveals a CK-MB  of 1.3, myoglobin 70.0, and troponin less than 0.05.  Urinalysis was normal.  Potassium was 4.6, with a BUN of 15, and creatinine of 1.5.  White count was  10.9 with a hemoglobin of 13.1, and hematocrit of 38.5.  The chest  radiograph, according to the radiologist, revealed no evidence of acute  disease.  The remaining studies were pending at the time of this dictation.   IMPRESSION:  1.  Recurrent atrial flutter.  History of ablation, status post multiple      cardioversions.  2.  Status post mitral valve replacement for mitral stenosis.  3.  Coronary artery disease, status post circumflex stent.  4.  Congestive heart failure.  5.  History of multiple arterial emboli due to anticoagulation non-      compliance.  6.  Dyslipidemia.  7.  Chronic obstructive pulmonary disease.  8.  Hypertension.  9.  Vague/mild right lower quadrant ache with normal exam.  Similar to that      which usually accompanies his atrial flutter recurrences.   PLAN:  1.  Telemetry.  2.  Serial cardiac enzymes.  3.  Aspirin.  4.  Intravenous Diltiazem.  5.  Heparin if INR is subtherapeutic.  6.  Continue amiodarone.  7.  Further measures per Dr. Donnie Aho.      MSC/MEDQ  D:  08/07/2004  T:   08/07/2004  Job:  [id number]   cc:   Darden Palmer., M.D.  [address]  [place]  [place] [address]  Fax: [phone]

## 2010-10-24 NOTE — Op Note (Signed)
[place]. [facility]  Patient:    [name], [name] Visit Number: [id number] MRN: [id number]          Service Type: MED Location: [phone] Attending Physician:  McDiarmid, Leighton Roach. Dictated by:   Abigail Miyamoto, M.D. Proc. Date: 06/23/01 Admit Date:  06/22/2001                             Operative Report  PREOPERATIVE DIAGNOSIS:  Acute abdomen.  POSTOPERATIVE DIAGNOSIS:  Small bowel infarction.  PROCEDURE:  Exploratory laparotomy with small bowel resection.  SURGEON:  Abigail Miyamoto, M.D.  ASSISTANT:  Adolph Pollack, M.D.  ANESTHESIA:  General endotracheal anesthesia.  ESTIMATED BLOOD LOSS:  Minimal.  INDICATION:  [name] is a 65 year old gentleman who presented with abdominal pain.  A CT scan showed ischemic-appearing small bowel with thickened wall.  He was found to have tenderness with guarding and acute abdomen on physical examination.  Secondary to his coagulopathy, he was given FFP and vitamin K to correct this prior to going to the operating room.  FINDINGS:  The patient was found to have approximately 40 cm of infarcted jejunum.  There was a question of whether this represented an embolic source or event.  There was also approximately a unit and a half of free blood in the peritoneal cavity.  DESCRIPTION OF PROCEDURE:  The patient was brought to the operating room and identified as [name].  He was placed supine on the operating table, and general anesthesia was induced.  His abdomen was then prepped and draped in the usual sterile fashion.  Using a #10 blade, a midline incision was then created and the incision was carried down through the fascia with electrocautery.  Upon entering the peritoneum, the patient was found to have a moderate amount of free blood.  Packs were placed in the abdominal cavity circumferentially.  The small bowel was then eviscerated, and the patient was found to have an infarcted segment  of jejunum approximately 40 cm in size.  No other intra-abdominal pathology was identified.  The small bowel was then transected just proximal and distal to the area of infarction with a GIA-55 stapler.  The mesentery was then taken down with Kelly clamps and 2-0 silk ties.  The mesentery was examined, and no bleeding was identified.  Next a side-to-side small bowel anastomosis was created by first reapproximating the bowel with silk sutures and then performing enterotomies and creating the anastomosis with a GIA-55 stapler.  The TA60 stapler was then used to close the open end.  An adequate anastomosis appeared to be achieved.  The mesenteric defect was then closed with interrupted 3-0 silk sutures.  The abdomen was then copiously irrigated with normal saline.  Hemostasis appeared to be achieved.  The midline fascia was then closed with a running #1 Prolene suture.  The skin was then irrigated and closed with skin staples.  The patient tolerated the procedure well.  All sponge, needle, and instrument counts were correct at the end of the procedure.  The patient was then extubated in the operating room and taken in stable condition to recovery. Dictated by:   Abigail Miyamoto, M.D. Attending Physician:  McDiarmid, Tawanna Cooler D. DD:  06/23/01 TD:  06/25/01 Job: [id number]

## 2010-10-24 NOTE — Discharge Summary (Signed)
[place]. [facility]  Patient:    [name], [name] Visit Number: [id number] MRN: [id number]          Service Type: MED Location: [phone] Attending Physician:  McDiarmid, Leighton Roach. Dictated by:   Juanell Fairly, M.D. Admit Date:  06/22/2001 Discharge Date: 07/03/2001   CC:         Elvina Sidle, M.D.  Darden Palmer., M.D.  Abigail Miyamoto, M.D.   Discharge Summary  DATE OF BIRTH:  [birth year]  DISCHARGE MEDICATIONS:  1. Diltiazem 120 mg p.o. q.d.  2. Lisinopril 20 mg p.o. q.d.  3. Toprol XL 25 mg p.o. q.d.  4. Amiodarone 200 mg p.o. q.d.  5. Digoxin 0.25 mg p.o. q.d.  6. Coumadin 5 mg p.o. q.d.  7. Lasix 40 mg p.o. b.i.d.  FOLLOWUP:  He was discharged with instructions to call Dr. Magnus Ivan for a followup with surgery, to call Dr. Milus Glazier at [facility] for an appointment the week of discharge, and to make an appointment with Dr. Donnie Aho as needed.  DISCHARGE INSTRUCTIONS:  He was discharged with instructions not to lift heavy objects for five weeks, to adhere to a heart healthy diet, and that he was allowed to shower.  DISCHARGE DIAGNOSES:  1. Small bowel necrosis, status post resection postoperative day #10.  2. Fever and leukocytosis.  3. Hypertension.  4. Atrial fibrillation.  5. Congestive heart failure/shortness of breath/tachypnea.  6. Hypokalemia.  7. Increased liver function tests.  8. Renal insufficiency.  9. Hypercoagulable state. 10. Hypoxemia. 11. Hypercholesterolemia. 12. Hematuria. 13. Deconditioning.  HOSPITAL COURSE:  This is a 65 year old white male who presented with epigastric pain for one day.  It was not associated with nausea, vomiting, diarrhea.  He described it as aching all over and going into his back.  He vomited once on his arrival at [facility].  The vomitus consisted of red-tinged fluid and it is unclear whether it was hematemesis or contrast from his CT.  On admission, he  denied fever and chills, denied headache, denied any other pains.  He stated his last PT check was four weeks ago.  For other medical history, please see H&P.  #1 - SMALL BOWEL NECROSIS:  The patient had a CT scan on January 15 which showed possible small bowel obstruction with small bowel introsusception and free fluid in the pelvis.  By the 16th, his white count increased from 16.4 to 19.4 and the patient was found to have an acute abdomen and taken to the OR for an exploratory laparotomy.  The patient was found to have approximately two feet of necrotic small bowel, which was resected.  He also had approximately a unit and a half of blood in his abdomen.  The patient was placed on Unasyn preoperatively, which was discontinued on January 22 after remaining afebrile for 48 hours.  The patients staples were removed on January 25.  His diet was increased to full p.o. and he was discharged on the 26th with his wound clean, dry, and intact.  #2 - FEVER/LEUKOCYTOSIS:  The patient had a temperature of 101.9 on January 18, 100.8 on January 19, and 102.1 on January 20.  Otherwise, the patient remained afebrile throughout hospitalization and 48 hours after his fever resolved Unasyn was discontinued.  On admission, the patients WBC count was 19.6.  By the time of discharge, his white count had dropped to 11.7.  #3 - HYPERTENSION:  The patients blood pressure on admission was  161/85.  He was placed on diltiazem, Zestril, Toprol XL, and was occasionally given Lasix, which improved his blood pressure throughout hospitalization and by discharge the Lasix has been changed to a standing dose b.i.d. to maintain blood pressure in the normal range, which at discharge was 115/75.  The reason for making the Lasix a scheduled medicine was that the patient repeatedly had difficulty breathing and tachypnea which was associated with an increased blood pressure and increased shortness of breath.  Once diuresed  with Lasix, the patients blood pressure normalized.  Therefore, Lasix was continued as a scheduled medicine.  #4 - ATRIAL FIBRILLATION:  The patient has atrial fibrillation with occasional rapid ventricular response.  He was placed on digoxin, Cardizem, and Coumadin. His goal INR was 2.5-3.5.  This was achieved by the day of discharge.  #5 - CONGESTIVE HEART FAILURE/SHORTNESS OF BREATH/TACHYPNEA:  The patients ABG showed respiratory alkalosis on January 24.  This was a recurrent aspect of the patients hospitalization until Lasix was made a scheduled medicine, at which point the patients tachypnea and shortness of breath greatly improved.  #6 - HYPOKALEMIA SECONDARY TO LASIX ADMINISTRATION AND IS HIGHLY CORRELATED WITH THE ADMINISTRATION OF THE LASIX:  The patient was given oral replacement numerous times over the course of his hospitalization and on discharge his potassium level was 4.1 and remained stable for a number of days despite scheduling Lasix.  #7 - ELEVATION OF LIVER ENZYMES:  This is most likely secondary to congestion from the patients CHF and with the resolution of his CHF this normalized.  #8 - RENAL INSUFFICIENCY:  The patient most likely, early in his course, was diuresed too aggressively.  He had increase in BUN and creatinine.  Subsequent to that, the diuresis was slowed and the renal insufficiency resolved.  #9 - HYPERCOAGULABLE STATE:  The patient has Bjork-Shiley mitral valve.  His Coumadin was restarted with a goal INR of 2.5-3.5.  #10 - HYPOXEMIA:  This is secondary to the patients CHF and resolved with resolution of the CHF.  #11 - HYPERCHOLESTEROLEMIA BY HISTORY:  A recheck of the patients fasting lipid profile while in the hospital showed his cholesterol to be 102, his HDL to be 19, and his LDL to be 51 and this needs to be taken in the context of  the patients malnutrition.  He also had hypoalbuminemia secondary to his long hospital stay and extended  period of time where he was n.p.o. and his poor eating habits prior to hospitalization secondary to the pain of his abdomen.  #12 - HEMATURIA:  On admission, the patients INR was 11.  He had not been taking his Coumadin properly.  He had a Foley placed and the urine obtained had blood in it.  A second UA was obtained which showed 3-6 red blood cells on microscopic examination.  His abdominal CT on admission showed a small cyst in his left kidney and abdominal ultrasound showed kidneys of normal echogenicity with no hydronephrosis.  A urology consult was obtained which suggested cystoscopy once the patient is better and has had the Foley out for a couple weeks to rule out a bladder tumor.  #13 - DECONDITIONING:  PT and OT saw the patient and were helping with improving his ability to walk and improving his strength.  SUGGESTED FOLLOWUP ITEMS: 1. In reference to small bowel necrosis, the patient is to follow up with    Dr. Magnus Ivan as needed.  His phone number is [phone]. 2. Hypertension.  During hospitalization, the  patient was placed on Thorazine    for hiccups and this directly correlated with an increase in the patients    blood pressure.  Thorazine has some possible reactions with his    antihypertensives and was discontinued and his blood pressure came back    down and it is recommended that this patient not be placed on Thorazine for    his hiccups. 3. Atrial fibrillation.  The patient will need his Coumadin checked as an    outpatient. 4. Congestive heart failure.  This will need to be followed closely,    particularly in regard to #6. 5. Hypokalemia.  The patients last potassium was 4.1 but, as he is remaining    on scheduled Lasix, he should have his potassium checked regularly. 6. Hypercholesterolemia.  The patients fasting lipid profile should be    rechecked after his diet has been normal for a long enough period of time    to bring his nutritional status back to  normal. 7. Hematuria.  The patient needs to see a urologist for followup on his    hematuria, and particularly he needs a cystoscopy when he is stable a week    or two after he leaves the hospital.  The urologist is Dr. Lindaann Slough. 8. Deconditioning.  It is highly recommended that this patient undergo    outpatient physical therapy and occupational therapy for his    deconditioning. Dictated by:   Juanell Fairly, M.D. Attending Physician:  McDiarmid, Tawanna Cooler D. DD:  07/05/01 TD:  07/06/01 Job: [id number]

## 2010-10-24 NOTE — Consult Note (Signed)
NAMEJAPHET, MORGENTHALER NO.:  [id number]   MEDICAL RECORD NO.:  [id number]          PATIENT TYPE:  INP   LOCATION:  3713                         FACILITY:  [facility]   PHYSICIAN:  Adolph Pollack, M.D.DATE OF BIRTH:  [birth year]   DATE OF CONSULTATION:  08/09/2004  DATE OF DISCHARGE:                                   CONSULTATION   PHYSICIAN REQUESTING THIS CONSULTATION:  Georga Hacking, M.D.   REASON:  Right lower quadrant pain with recurrent atrial flutter.   HISTORY OF PRESENT ILLNESS:  Mr. Burciaga is a 65 year old male with a  prosthetic heart failure who has had a history of multiple emboli brain,  spleen and to small intestine in the past requiring a small bowel resection.  A little over one month ago he presented to the hospital with recurrent  atrial flutter and was having some right lower quadrant abdominal pain like  he is now.  The pain eventually resolved.  Approximately 3 days ago, he went  to his primary care physician with some dull right lower quadrant pain that  was persistent.  He subsequently was admitted, finding that he again was in  recurrent atrial flutter.  I was asked to see him just to make sure he did  not have recurrence of mesenteric ischemia.  He has no nausea or vomiting,  no fever or chills.  He denies melena, hematochezia, dysuria, hematuria.   PAST MEDICAL HISTORY:  1.  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  2.  Recurrent atrial flutter.  3.  Coronary artery disease.  4.  Heart failure.  5.  Arterial emboli.  6.  Dyslipidemia.  7.  Hypertension.  8.  COPD.   PREVIOUS ABDOMINAL OPERATIONS INCLUDE:  1.  Exploratory laparotomy with small bowel resection by Dr. Abigail Miyamoto January of 2003.   HOME MEDICATIONS:  Amiodarone, Coreg, Lasix, quinapril, enteric coated  aspirin.  Along with that at the hospital, he is also receiving some Lasix  and some Zetia, diltiazem, lisinopril, Coumadin,  heparin.   ALLERGIES:  NO KNOWN DRUG ALLERGIES.   REVIEW OF SYSTEMS:  GI:  As stated before, he just has this intermittent  right lower quadrant pain that seems to be related to atrial flutter.  No  diarrhea.  GU:  No dysuria, hematuria.   PHYSICAL EXAMINATION:  Generally, a well-developed, well-nourished male, he  is sleeping when I walked into the room.  He is in no acute distress when I  awaken him.  His temperature is 97.6, blood pressure is 102/70, pulse is  irregular.  EYES:  Extraocular motions intact, no icterus.  RESPIRATORY:  The breath sounds are equal and clear, respirations are  nonlabored.  His heart rate is irregular.  ABDOMEN:  Soft, protuberant.  He has a midline incision with a reducible  ventral incisional hernia.  He has some mild tenderness in the right lower  quadrant very close to the abdominal wall area.  When he flexes his rectus  muscle he still has tenderness in that one spot.  He has no  peritoneal  signs.  He has active bowel sounds.  No palpable masses or organomegaly.  PULSES:  He has palpable radial and femoral pulses.   LABORATORY DATA:  His urinalysis is negative.  His white blood cell count is  8,800.   CT scan was reviewed.  He has a new splenic infarct.  There is question of a  very mild inflammatory change possibly in the right lower quadrant area;  however, the appendix is normal.  There is no bowel wall thickening, no free  fluid.  The mesenteric vessels are patent.   IMPRESSION:  Recurrent right lower quadrant pain accompanied by atrial  flutter.  As of now, it does not look like he has intestinal ischemia.  He  may have just a transient low flow state to that area that accompanies his  atrial flutter.  There is no peritonitis present, as was the case back in  2003.   PLAN:  I would continue observation.  I think it is fine for him to take a  diet.  Will check a CBC, amylase and lactate level in the morning.      TJR/MEDQ  D:   08/09/2004  T:  08/10/2004  Job:  [id number]   cc:   Darden Palmer., M.D.  [address], [address]  [place]  [place] [address]  Fax: [phone]

## 2010-10-24 NOTE — Discharge Summary (Signed)
[place]  Patient:    [name], [name] Visit Number: [id number] MRN: [id number]          Service Type: MED Location: 2000 2023 01 Attending Physician:  Norman Clay Admit Date:  09/28/2001 Discharge Date: 10/02/2001   CC:         [facility]   Discharge Summary  FINAL DIAGNOSIS: 1. Congestive heart failure - resolved. 2. Mitral valve replacement with Bjork-Shiley valve in 1982. 3. Atrial fibrillation and flutter with cardioversion. 4. History of mesenteric embolus, January 2003. 5. Esophageal dysphagia. 6. Psychiatric disorder.  PROCEDURES:  Cardioversion.  HISTORY:  The patient is a 65 year old male with a very complex past medical history.  He developed severe shortness of breath and presented with congestive heart failure.  He has a prior history of a prosthetic mitral valve placed for mitral stenosis in 1982 and has been on chronic Coumadin therapy and has had strokes due to failure to check his Coumadin properly.  He was recently admitted with mesenteric embolus and has not been seen in the office since he was discharged from that hospitalization.  Please see the previously dictated history and physical for the remainder of the details.  HOSPITAL COURSE:  The patient was brought in and was found to be in atrial flutter with diffuse ST changes.  He was in forward congestive heart failure. He was given intravenous Diltiazem and was diuresed.  The next morning, he was in atrial fibrillation.  The rate was better controlled at that time.  He had a hemoglobin of 11.6, hematocrit of 33.5, pro time/INR 4.7, and Coumadin was held.  Sodium 142, potassium 4.1, glucose 162, BUN 22.  The AST and ALT were elevated.  The CPK MB was 4.4, troponin was 0.04, and CPK remained low.  The digoxin level was less than 0.1.  The TSH was 1.289.  Echocardiogram showed an ejection fraction of 20-30%.  There was a normal aortic  valve.  Prosthetic Bjork-Shiley mitral valve was seen with no regurgitation.  There was mild left and right atrial enlargement.  The patient had previously been on amiodarone as an outpatient and cardioversion was done with 200 watts resulting in sinus rhythm.  His breathing was better and his weight was 138.  By October 02, 2001, he had remained in sinus rhythm.  He had diuresed.  The INR was 2.2 and was stable on discharge.  DISPOSITION:  He was discharged in improved condition on Coumadin 7.5 mg one day a week and 5 mg on the other days of the week.  Amiodarone 400 mg daily and we will reduce this as an outpatient.  It should eventually be on a dosage of 200 mg daily, Lanoxin 0.25 daily, Lisinopril 10 daily, Lasix 40 twice a day, and Zocor 20 daily.  He is to see myself for an appointment in followup in one week and is to call if there are problems. Attending Physician:  Norman Clay DD:  10/17/01 TD:  10/19/01 Job: [id number]

## 2010-10-24 NOTE — Consult Note (Signed)
[name], [name]               ACCOUNT NO.:  [id number]   MEDICAL RECORD NO.:  [id number]          PATIENT TYPE:  INP   LOCATION:  4741                         FACILITY:  [facility]   PHYSICIAN:  Michael L. Reynolds, M.D.DATE OF BIRTH:  14-Oct-1945   DATE OF CONSULTATION:  06/07/2006  DATE OF DISCHARGE:                                 CONSULTATION   REQUESTING PHYSICIAN:  Dr. Viann Fish.   REASON FOR EVALUATION:  Code stroke.   HISTORY OF PRESENT ILLNESS:  This is an inpatient consultation  evaluation of this 65 year old man with a past medical history of which  includes prosthetic mitral valve replacement in 1982 with subsequent  result in thrombotic events including several previous thrombotic  strokes sometimes occurring in the setting of noncompliance of  medications and difficulty with controlling his chronic anticoagulation.  He has known previous strokes to the right frontal area, the left  parietal area and the cerebellar hemispheres.  He was in his usual state  of health until earlier this morning.  He says that he was at his  uncle's house at the time, and noticed while he was there that he was  having difficulty with picking up a cup of coffee and using his left  hand.  He also noted that at that time his left hand felt a little numb.  He was able to ride [place] moped [place] the cour[place], but while at the  courthouse, his left hand was clumsy and he was dropping his keys.  He  came back home at that point and notified his doctor who advised him to  come to the emergency room for evaluation, and shortly after his arrival  to the emergency room, a code stroke was called.  The patient said that  he has not had any other recent neurologic symptoms and has had no  symptoms similar to this in the last several days.  He is not aware of  any facial droop or disturbance of speech, any disturbance in his vision  or difficulty with walking or leg strength along with his left arm  clumsiness.  He denies any associated headache, shortness of breath,  chest pain, palpitations, nausea, or vomiting, or alteration of  consciousness.   PAST MEDICAL HISTORY:  As above.  He also has known coronary artery  disease and a history of CHF in the past.  He does have a long history  of emboli including emboli to the brain and the gut, and again he has  had problems with compliance with Coumadin in the past, although this  seems to have been better recently.  He has a history of atrial  fibrillation and is status post AV nodal ablation and pacemaker  placement for rhythm control.  He has known hypertension and  dyslipidemia.  His mitral valve disease, which led to replacement in  1982, was thought to be due to remote rheumatic fever.   FAMILY HISTORY:  Remarkable for coronary artery disease in his father.   SOCIAL HISTORY:  He lives alone, and his wife left him a few years ago.  He has  children and friends who look after him.  He has been a heavy  alcohol user in the past and it is not clear exactly how much he is  using now, although he does admit to some recent wine consumption.  No  history of other known illicit drug use.   REVIEW OF SYSTEMS:  Per HPI and admission H&P.   ALLERGIES:  NO KNOWN DRUG ALLERGIES.   MEDICATIONS:  Per H&P.  Is currently anticoagulated on baby aspirin and  Coumadin.  His other medications include Lasix, Carvedilol, Crestor,  digoxin, quinapril, Zetia.   PHYSICAL EXAMINATION:  VITAL SIGNS:  Temperature 97.1, blood pressure  154/88, pulse 73, respirations 20, O2 sat 97% on room air.  GENERAL EXAMINATION:  This is a chronically ill-appearing man, supine in  hospital bed, in no acute distress.  HEENT:  Head:  Cranium is normocephalic and atraumatic.  Oropharynx:  Benign.  NECK:  Supple, without carotid or supraclavicular bruits.  HEART:  Regular rate and rhythm, without murmur.  NEUROLOGICAL EXAM:  Mental status:  He is awake and alert.  He is  fully  oriented to time, place, and person.  Recent and remote memory are  intact for history giving. Attention span, concentration, and fund of  knowledge are all adequate.  Speech is minimally dysarthric, which is  his baseline, and he seems to have no difficulty with fluency or word  finding.  He has some difficulty repeating a phrase, however.  Cranial  Nerves:  Pupils are equal and reactive.  Extraocular movements full,  without nystagmus.  Visual fields are full to confrontation.  Hearing is  intact to conversational speech.  Facial sensation is intact to  pinprick.  Face, tongue, and palate move normally and symmetrically.  Motor:  Normal bulk and tone.  He has very slight weakness of the  intrinsic muscles of left hand.  Otherwise, normal strength in all  tested extremity muscles.  Sensation:  Reports some diminished pinprick  sensation over the right side as compared of the left.  Otherwise,  intact to light touch and pinprick throughout.  Coordination:  Rapid  movements are performed well.  Finger-to-nose is performed well.  Gait  is deferred.  Reflexes 2+ and symmetric.  Toes are down-going  bilaterally.   LABORATORY REVIEW:  CBC:  White count 10.3, hemoglobin 14.5, platelets  195,000 with a slight relative monocytosis.  Chemistries are remarkable  for a slightly elevated glucose at 102, BUN and creatinine 31 and 1.4  respectively, normal liver functions.  INR is 2.1.  Cardiac enzymes are  negative.   CT scan is personally reviewed, and the study demonstrates old bilateral  cerebellar strokes and old infarcts in the right frontal left temporal  and left parietal areas, which all appear stable.  Does not to be an  acute finding.   IMPRESSION:  1. A small right brain ischemic stroke, probably cardioembolic, with      resultant of mild left hand weakness and clumsiness.  2. History of mitral valve replacement, with therapeutic chronic      anticoagulation. 3. History of  pacemaker placement and AV nodal ablation.   RECOMMENDATIONS:  Will continue Coumadin and baby aspirin.  We will  check his risk factors including his lipids and monitor his blood  pressure.  We will check carotid and transcranial Dopplers.  It would  also be reasonable to check a CT angiogram of the head and neck, but his  BUN and creatinine are a little borderline  for that, so we will hold off  for now.  Consider TEE to rule out valve problems, although not sure  whether treatment would be offered to him, given that he is  anticoagulated.  Stroke service to follow.   Thank you for the consultation.      Michael L. Thad Ranger, M.D.  Electronically Signed     MLR/MEDQ  D:  06/07/2006  T:  06/08/2006  Job:  [id number]
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
## 2010-10-24 NOTE — H&P (Signed)
NAME:  JASHAN, COTTEN                         ACCOUNT NO.:  [id number]   MEDICAL RECORD NO.:  [id number]                   PATIENT TYPE:  OBV   LOCATION:  3727                                 FACILITY:  [facility]   PHYSICIAN:  Quita Skye. Waldon Reining, MD             DATE OF BIRTH:  May 14, 1946   DATE OF ADMISSION:  08/30/2003  DATE OF DISCHARGE:                                HISTORY & PHYSICAL   [name] is a 65 year old white male who was admitted to [facility] for an exacerbation of congestive heart failure.  He also  complained of chest pain.   The patient  has extensive history of cardiac disease.  He is known to have  an ischemic cardiomyopathy.  He has previously suffered multiple myocardial  infarctions.  He has undergone stenting of the circumflex and is also known  to have a total occlusion of the right coronary artery.  He has previously  undergone mitral valve replacement.  In addition, he has recurrent atrial  flutter despite prior ablation.   The patient presented to the emergency department after awakening during the  night acutely short of breath.  There appeared to be no preceding orthopnea,  paroxysmal nocturnal dyspnea, or edema.  He has also noted, since awakening,  an intermittent sharp discomfort in the left mid axillary line that is  exacerbated slightly by deep inspiration.  There were no other exacerbating  or ameliorating factors.  The discomfort appears not to be related to meals  or position. The chest discomfort has largely resolved at this time.  In  addition, his dyspnea has markedly improved since his arrival and treatment  in the emergency department.  He feels that his breathing is largely at  baseline at this time.   The patient has a number of other medical problems.  He is status post  multiple cerebrovascular accidents.  In addition, he is status post multiple  peripheral and mesenteric emboli.  He has a history of  dyslipidemia and  hiatal hernia.   MEDICATIONS:  The patient is on a number of medications for the  aforementioned medical problems.  These include Furosemide, Zocor,  quinapril, Coumadin, and Coreg.   ALLERGIES:  He is not allergic to any medications.   PAST SURGICAL HISTORY:  1. Mitral valve replacement as described above.  2. Abdominal surgery for the mesenteric emboli as described above.   SIGNIFICANT INJURIES:  None.   SOCIAL HISTORY:  The patient lives alone.  He is divorced.  He is retired.  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  He has two  children.  He continues to smoke cigarettes.   FAMILY HISTORY:  His mother died of coronary artery disease in her 53s.  His  father died from coronary artery disease in his 28s.  He had a brother who  died in his 2s of an unknown  heart ailment.   REVIEW OF SYMPTOMS:  This revealed no new problems related to his head,  eyes, ears, nose, mouth, throat, lungs, gastrointestinal systems,  genitourinary systems, or extremities.  There was no history of neurologic  or psychiatric disorder.  There was no history of fever, chills, or weight  loss.   PHYSICAL EXAMINATION:  GENERAL APPEARANCE:  The patient was a middle aged  white male in no discomfort.  He was alert, oriented, appropriate, and  responsive.  VITAL SIGNS:  Blood pressure 133/80, pulse 110 and irregularly irregular,  respiratory rate 20, temperature 98.1, pulse oximeter 97% on two liters.  HEENT:  Head, eyes, nose, and mouth normal.  NECK:  Without thyromegaly or adenopathy.  Carotid pulses are palpable  bilaterally without bruits.  LUNGS:  Clear.  CARDIOVASCULAR:  Examination revealed crisp prosthetic valve sounds.  There  was no murmur, rub, or click.  Cardiac rhythm was irregularly regular.  No  chest wall tenderness was noted.  ABDOMEN:  Soft and nontender.  There was no mass, hepatosplenomegaly, bruit,  distention, rebound, guarding, or  rigidity.  Bowel sounds are normal.  RECTAL AND GENITAL:  Examinations were not performed as they were not  pertinent to the reason for acute care hospitalization.  EXTREMITIES:  Without edema, deviation, or deformity. Radial and dorsalis  pedis pulses were palpable bilaterally.  NEUROLOGIC:  Brief screening neurologic survey demonstrated no focal  neurologic findings.   The electrocardiogram revealed atrial fibrillation with a rapid ventricular  rate, 115 beats per minute.  There is a nonspecific intraventricular  conduction delay as well as nonspecific ST and T-wave changes.   The chest radiograph, according to the radiologist, demonstrated congestive  heart failure.   The initial set of cardiac markers revealed a myoglobin of 59.2, CK-MB 1.5,  and troponin less than 0.05.  The second set revealed a myoglobin of 61.4,  CK-MB of 2.3, and troponin less than 0.05.  White count was 12.3 with  hemoglobin 11.3 and hematocrit 33.0.  Potassium 3.6, BUN 16, creatinine 1.3.  Remaining studies were pending at the time of this dictation.  INR 1.9.   IMPRESSION:  1. Congestive heart failure exacerbation; systemic cardiomyopathy.  2. Chest pain, rule out myocardial infarction.  3. Coronary artery disease; status post multiple myocardial infarctions;     status post circumflex stent and chronic total right coronary artery     occlusion.  4. Recurrent atrial flutter despite prior ablation.  5. Status post multiple cerebrovascular accidents.  6. Status post multiple peripheral and mesenteric emboli.  7. Dyslipidemia.  8. Hiatal hernia.  9. Anemia.   PLAN:  1. Telemetry.  2. Serial cardiac enzymes.  3. Continued Coumadin (INR 1.9).  4. Diuresis.  5. Daily weights, inputs and outputs.  6. Oxygen.  7. Discontinue smoking.  8. Intravenous Diltiazem for ventricular rate control.  9. Additional measures per Dr. Donnie Aho.                                               Quita Skye. Waldon Reining,  MD    MSC/MEDQ  D:  08/30/2003  T:  08/30/2003  Job:  [id number]

## 2010-10-24 NOTE — Discharge Summary (Signed)
NAMEALEXIE, SAMSON NO.:  [id number]   MEDICAL RECORD NO.:  [id number]          PATIENT TYPE:  INP   LOCATION:  5707                         FACILITY:  [facility]   PHYSICIAN:  Thora Lance, M.D.  DATE OF BIRTH:  05-Jan-1946   DATE OF ADMISSION:  01/12/2005  DATE OF DISCHARGE:  01/16/2005                                 DISCHARGE SUMMARY   REASON FOR ADMISSION:  Mr. Lasser is a 65 year old white male followed by  Dr. Felizardo Hoffmann and Dr. Viann Fish.  He has history of a mechanical  mitral valve replacement in 1981, coronary artery disease, atrial  fibrillation, hypertension, multiple arterial emboli, and alcohol abuse.  The patient had been admitted to [facility] one week prior to  admission to [facility] for alcohol detoxification.  On the day he was to be  discharged from [facility] he began having problems with fevers and  chills, right back and right chest pain, dry cough.  He has had chronic  problems with pain in his feet with fissuring and dryness.  He was sent to  the [facility] Emergency Room for evaluation.   SIGNIFICANT FINDINGS:  VITAL SIGNS:  Temperature 100.2, blood pressure  113/55, oxygen saturation was 96%, but dropped to 84% when taken off oxygen.  HEENT:  Oropharynx edentulous.  Mucosa very dry.  LUNGS:  Increased breath sounds right mid lung zone.  HEART:  Regular rate and rhythm with a mechanical second heart zone.  ABDOMEN:  Benign.  RECTAL:  Mild prostate tenderness.  EXTREMITIES:  Feet scaly and very hyperkeratotic.   LABORATORY DATA:  At admission:  CBC:  WBC 23.6, hemoglobin 13.4, platelet  count 222, left shift.  PT 19.7, INR 1.7.  Chemistries:  Sodium 134,  potassium 4.3, chloride 102, bicarbonate 25, glucose 144, BUN 29, creatinine  1.7, calcium 8.9.  Urinalysis was negative.  A chest x-ray showed chronic  lung disease and scarring and a question of right upper lobe infiltrate.  CT  scan of the chest in the  emergency room showed no evidence of acute  pulmonary embolus, a tiny nodule in the right middle lobe which will need  follow-up in three months, and bilateral lower lobe and lingular scarring.   HOSPITAL COURSE:  #1 - FEVER:  The patient was admitted with fever.  He had  an obvious bronchitis and was felt to have possible early pneumonia.  On  August 8 he was also noted to have erythema, warmth, tenderness in the right  anterior calf.  An ultrasound was done and there was no evidence of a DVT.  He was diagnosed with cellulitis.  His prostate was mildly tender and the  possibility of prostatitis was also entertained.  He was placed on Rocephin  1 g q.12h.  He had temperature as high as 103.5 but within 48 hours it  defervesced.  His white blood cell count normalized within 48 hours.  The  patient had marked clinical improvement over the four days he was  hospitalized with near resolution of his cough and chest pain.  His right  leg erythema had improved, but not resolved by discharge.  The warmth and  tenderness had improved.  Final diagnosis was right leg cellulitis likely  secondary to the marked fissuring and dryness in his feet as well as  bronchitis and possible early pneumonia.  He was discharged on Ceftin.   #2 - RIGHT LEG SWELLING:  As above, he was diagnosed with cellulitis.  Venous ultrasound of the right leg showed no evidence of DVT.   #3 - VOLUME DEPLETION:  The patient did have some volume depletion at  admission.  He was treated with two days of IV fluids with normalization of  his BUN and creatinine which at discharge were 8 and 1.3.  IV fluids were  discontinued and his Lasix which had been held was restarted at discharge.   #4 - ANTICOAGULATION:  The patient's INR was subtherapeutic at 1.7 at  admission.  His goal was 2.5-3.5 to close his mechanical heart valve.  The  patient was placed on Lovenox which was continued throughout the  hospitalization.  On the day of  discharge his INR was up to 2.8 and the  Lovenox was discontinued.   #5 - RIGHT LUNG NODULE:  The CT scan of the chest did incidentally show a  small nodule in the right middle lobe.  The patient is a smoker.  This will  need to be followed up as an outpatient in three to six months.   Chronic medical issues including coronary artery disease, atrial  fibrillation, hypertension, valvular heart disease, and history of arterial  emboli were all stable during the hospitalization.   DISCHARGE DIAGNOSES:  1.  Right leg cellulitis.  2.  Acute bronchitis.  3.  Volume depletion.  4.  Under-anticoagulation.  5.  History of atrial fibrillation.  6.  History of coronary artery disease.  7.  Hypertension.  8.  Mechanical mitral valve replacement.  9.  History of multiple arterial emboli.  10. Hyperlipidemia.   PROCEDURES:  1.  CT scan of the chest.  2.  Right leg venous Doppler ultrasound.   DISCHARGE MEDICATIONS:  1.  Ceftin 500 mg p.o. b.i.d. six days.  2.  Amiodarone 200 mg p.o. b.i.d.  3.  Coreg 6.25 mg p.o. b.i.d.  4.  Furosemide 40 mg p.o. daily.  5.  Lisinopril 10 mg p.o. daily.  6.  Coumadin 5 mg on Tuesday, Thursday, Saturday, and Sunday and 7.5 mg on      Monday, Wednesday, and Friday.  7.  Vytorin 10/40 once a day.  8.  Lac-Hydrin cream to feet twice a day.   DISPOSITION:  Discharged to home.   ACTIVITY:  As tolerated.   DIET:  Low sodium.   SPECIAL INSTRUCTIONS:  Instructed not to drink alcohol.   FOLLOW-UP:  Check PT/INR with Dr. Donnie Aho next week.  I will arrange.  One  week with Dr. Delrae Alfred.       JJG/MEDQ  D:  01/16/2005  T:  01/16/2005  Job:  [id number]   cc:   Georga Hacking, M.D.  [address], [address]  [place]  [place] [address]  Fax: [phone]

## 2010-10-24 NOTE — H&P (Signed)
NAME:  [name], [name]                         ACCOUNT NO.:  [id number]   MEDICAL RECORD NO.:  [id number]                   PATIENT TYPE:  INP   LOCATION:  1829                                 FACILITY:  [facility]   PHYSICIAN:  Colleen Can. Deborah Chalk, M.D.            DATE OF BIRTH:  1946-03-03   DATE OF ADMISSION:  01/13/2004  DATE OF DISCHARGE:                                HISTORY & PHYSICAL   HISTORY:  [name] is a 65 year old male with multiple medical problems  who presented with severe abdominal pain.  He came to emergency room.  Initially had scans looking for abdominal aortic aneurysm and possible  rupture.  These scans showed questionable lesion in a kidney as well as  gallstones.  He is also noted to have a tachycardia at that point in time  and was felt to be sinus tachycardia initially, but subsequently it was  clear that it was atrial flutter.  As soon as the atrial flutter was slowed  with Cardizem he had resolution of his abdominal discomfort.   His INR was not therapeutic, at the time of admission, and this has been a  recurrent problem for him.  He has a history of a Bjork-Shiley valve, a  history of a combined ischemic and idiopathic cardiomyopathy, congestive  heart failure, and a history of atrial fibrillation.  He has had multiple  emboli secondary to his Bjork-Shiley valve.  He has had a history of  hypertriglyceridemia and ongoing cigarette abuse, although he stopped  smoking approximately 5 months ago.   PAST MEDICAL HISTORY:  His past medical history includes multiple embolic  events including CVAs as well as to his abdomen and lower extremities.  He  does have a prosthetic Bjork-Shiley valve.  He has general noncompliance.  He does not really do a good job with his Coumadin anticoagulation.  He has  had prior atrial flutter ablation.  There is some confusion as to what  medicines he is actually on.   He has had prior stent to his left circumflex.  He has  known occlusion of  his right coronary artery.   ALLERGIES:  None.   CURRENT MEDICINES:  Coumadin 5 mg a day and aspirin. It is certainly  uncertain as to which other medicines he is taking.   FAMILY HISTORY:  Noncontributory.   SOCIAL HISTORY:  He lives alone. He is retired, now, from [employer] after  many years.  He is divorced.  Stopped smoking in March of 2005.   REVIEW OF SYSTEMS:  He saw Dr. Donnie Aho in June. He has not had any abdominal  pain, diarrhea, or blood in his stool.  There are no urinary symptoms.  He  is able to work, mow his neighbors yard, travel, and generally feels good.   PHYSICAL EXAMINATION:  GENERAL:  On exam he is in no acute distress.  Currently feels much better than what he did initially (  He did drive himself  to the emergency room.  VITAL SIGNS:  Blood pressure 97/40, heart rate is in the 150s.  LUNGS:  Reasonably clear.  HEART:  Showed tachycardia.  ABDOMEN:  Soft and protuberant.  Healed midline scar.  Normal bowel sounds.  It is really not tender.  EXTREMITIES:  Without edema.   LABS:  White count 17,000; hemoglobin is 15; hematocrit is 46; platelets  214.  INR is only 1.0.  Chemistries are basically normal.  Glucose is 129, B-  peptide is 1001.  EKG shows atrial flutter.  Chest x-ray shows scarring on  the left with cardiomegaly.   OVERALL IMPRESSION:  1. Abdominal pain with history of prior mesenteric emboli, abdominal pain,     resolved.  2. Atrial flutter despite prior ablation and questionable current therapies.  3. History of congestive heart failure with elevated BNP.  4. Known coronary artery disease.  5. Inadequate anticoagulation.  6. Further disposition will be per Dr. Donnie Aho.  With his multiple embolic     episodes, and with a Bjork-Shiley valve and his noncompliance with     anticoagulation I think certainly the consideration for redo mitral valve     surgery with a tissue valve may be appropriate, although that certainly      would be at high risk and a difficult decision to make although the     multiple recurrent embolic events and lack of compliance with the     anticoagulation regimen certainly pose a great risk to Mr. Elwyn Reach.                                                Colleen Can. Deborah Chalk, M.D.    SNT/MEDQ  D:  01/13/2004  T:  01/13/2004  Job:  [id number]   cc:   W. Ashley Royalty., M.D.  [address]  [place]  [place] [address]  Fax: [phone]

## 2010-10-24 NOTE — Consult Note (Signed)
NAMEANAKIN, VARKEY NO.:  [id number]   MEDICAL RECORD NO.:  [id number]          PATIENT TYPE:  INP   LOCATION:  3713                         FACILITY:  [facility]   PHYSICIAN:  Doylene Canning. Ladona Ridgel, M.D.  DATE OF BIRTH:  1945-10-16   DATE OF CONSULTATION:  08/08/2004  DATE OF DISCHARGE:                                   CONSULTATION   PHYSICIAN REQUESTING CONSULT:  Dr. Georga Hacking.   INDICATION FOR CONSULTATION:  Evaluation of atrial flutter.   HISTORY OF PRESENT ILLNESS:  The patient is a 65 year old man with a history  of Bjork-Shiley mitral valve replacement in 1982 for mitral stenosis.  He  has been on Coumadin therapy since, but has been noncompliant, and has had  multiple embolic complications secondary to his lack of Coumadin therapy.  The patient has coronary disease as well and underwent catheterization and  stenting of the left circumflex in October of 2002.  He has a mixed  cardiomyopathy.  The patient developed atrial flutter and underwent  electrophysiology study catheter ablation of his atrial flutter  approximately one year ago.  At the time, he had typical atrial flutter.  He  has subsequently developed recurrent atrial flutter which is atypical.  Review of his EKGs demonstrates multiple atrial flutter morphologies of  varying cycle lengths.  He was admitted to the hospital and underwent TEE  guided cardioversion back in February of 65, and did well for several  weeks, but then had recurrence of his atrial flutter which he describes as  being associated with chest discomfort.  The patient does not feel  palpitations.  He has admitted to some increasing dyspnea, and is admitted  for additional evaluation.  He was subsequently found to be in atrial  flutter with a rapid ventricular response.  He has been treated with IV  calcium channel blocker.  Of note, the patient was subtherapeutic with  regard to his INR at the time of his admission.   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  He has a  history of infrainguinal arterial occlusive disease secondary to recurrent  emboli.  He has a history of stroke with expressive aphasia.  He has a  family history of mother dying at age 75 of cardiac problems and father  dying at age 71 of an MI.   SOCIAL HISTORY:  The patient is divorced and lives alone in [place].  He  quit smoking one year ago.  He rarely drinks alcohol.  He is a retired  Curator at [employer].   REVIEW OF SYSTEMS:  Negative for vision or hearing problems.  He does have  an increased rate secondary to fluid overload.  He denies headache, vision  or hearing problems. He has problems with itching skin over his face and  extremities.  He denies frank syncope, claudication or cough.  He does have  rare palpitations, and dizzy spells with exertion. He denies chest pain.  He  denies dysuria, hematuria, nocturia, weakness, numbness or depression.  He  denies nausea, vomiting or diarrhea.  He has had a history of GI bleeding in  the past.  He denies arthralgias or joint swelling.  The rest of his review  of systems was negative.   PHYSICAL EXAMINATION:  GENERAL:  He is a pleasant middle-aged man, in no  distress.  VITAL SIGNS:  Blood pressure 95/70, pulse 62, and irregular, respirations  20, temperature 97.  HEENT:  Exam is normocephalic, atraumatic.  Pupils are equal and round.  Oropharynx is moist.  Sclerae are anicteric.  NECK:  Revealed no jugular venous distension.  There is no thyromegaly.  The  trachea was midline.  LUNGS:  Revealed rales in the bases bilaterally.  CARDIOVASCULAR:  Exam revealed an irregular rate and rhythm, with normal S1  and S2.  ABDOMEN:  Abdominal exam was soft, somewhat protuberant with a left-sided  hernia and a laparotomy scar.  EXTREMITIES:  Demonstrated trace peripheral edema bilaterally.   Pulses were  decreased symmetrically.  NEUROLOGIC:  Alert and oriented x2 with cranial nerves intact.  Strength 5/5  and symmetric.   EKG demonstrates atypical atrial flutter with rapid ventricular response.   IMPRESSION:  1.  Recurrent left atrial flutter.  2.  Status post mitral valve replacement with a #29 Bjork-Shiley in 1982.  3.  Chronic Coumadin therapy with recurrent embolic complications secondary      to noncompliance.  4.  Atrial fibrillation and flutter.  5.  Status post transesophageal echocardiogram in February revealing severe      left ventricular dysfunction.  6.  Multiple embolic complications including cerebral, lower extremity, and      mesenteric infarcts.  7.  Coronary disease with subtotally occluded right coronary artery and      stented left circumflex.  8.  Congestive heart failure present with symptoms being class II-III on      admission.  When he is in sinus rhythm, I think his class is I to II.   DISCUSSION:  I discussed the treatment options with the patient.  I think he  would not be a good candidate for any anti-arrhythmic drug therapy with the  exception of amiodarone.  This probably should be up-titrated.  Consideration for a permanent EnRhythm pacemaker which would automatically  attempt to pace terminate his atrial fibrillation would also be warranted.  I have discussed this with the patient.  I think probably ICD implantation  in this patient with multiple comorbidities and difficulty with compliance  is not likely in his best interest.  I will discuss all of these options  with Dr. Donnie Aho and tentatively plan to proceed with pacemaker implantation  so that we can hopefully override pace of his atrial flutter back to sinus  rhythm on a regular basis.      GWT/MEDQ  D:  08/08/2004  T:  08/09/2004  Job:  [id number]   cc:   Darden Palmer., M.D.  [address], [address]  [place]  [place] [address] Fax: [phone]   Marcene Duos,  M.D.  [phone] [address]  [place]  [place] [address]  Fax: [phone]

## 2010-10-24 NOTE — Op Note (Signed)
[name], [name] NO.:  [id number]   MEDICAL RECORD NO.:  [id number]          PATIENT TYPE:  INP   LOCATION:  2031                         FACILITY:  [facility]   PHYSICIAN:  Georga Hacking, M.D.DATE OF BIRTH:  Dec 02, 1945   DATE OF PROCEDURE:  10/09/2005  DATE OF DISCHARGE:  10/10/2005                                 OPERATIVE REPORT   PROCEDURE:  Cardioversion.   INDICATIONS:  Recurrent atrial fibrillation.   HISTORY:  The patient has just had cardioversion done about 2 weeks ago and  had recurrent atrial fibrillation that was symptomatic.  His Amiodarone  level was found to be low, and he was reloaded with Amiodarone.  He  underwent cardioversion with general anesthesia done by Dr. Adonis Huguenin at  the patient's bedside.  Pentothal 150 mg was used.  Cardioversion was done  with synchronized biphasic defibrillation with anterior and posterior  patches with 120 watts yielding version to sinus rhythm after 1 shock.  This  is confirmed by pacemaker interrogation.   IMPRESSION:  Successful cardioversion of atrial fibrillation.      Georga Hacking, M.D.  Electronically Signed     WST/MEDQ  D:  10/09/2005  T:  10/10/2005  Job:  [id number]   cc:   Doylene Canning. Ladona Ridgel, M.D.  [address]  [address]  [place]  [place] [address]

## 2010-10-24 NOTE — Discharge Summary (Signed)
[name], [name]               ACCOUNT NO.:  [id number]   MEDICAL RECORD NO.:  [id number]          PATIENT TYPE:  INP   LOCATION:  4714                         FACILITY:  [facility]   PHYSICIAN:  Corinna L. Lendell Caprice, MDDATE OF BIRTH:  May 16, 1946   DATE OF ADMISSION:  03/17/2008  DATE OF DISCHARGE:  03/21/2008                               DISCHARGE SUMMARY   DISCHARGE DIAGNOSES:  1. Left lower lobe pneumonia, possibly hospital acquired.  2. Acute renal insufficiency.  3. Atrial fibrillation.  4. Subtherapeutic Coumadin.  5. Mitral valve replacement.  6. History of mural thrombus and recurrent embolic stroke.  7. History of congestive heart failure with ejection fraction of 20%.   DISCHARGE MEDICATIONS:  1. Augmentin 500 mg twice a day until gone.  2. Doxycycline 100 mg twice a day until gone.  3. Continue Crestor 20 mg a day.  4. Carvedilol 6.25 mg a day.  5. Furosemide 40 mg a day.  6. Digoxin 0.25 mg a day.  7. Lisinopril 10 mg a day.  8. Aspirin 81 mg a day.  9. Coumadin as directed.  10.Pacerone 200 mg day.  11.KCl 20 mEq a day.   DIET:  Cardiac.   ACTIVITY:  Ad lib.   Follow up with Dr. Lynelle Doctor in 4 weeks.  Follow up with [facility] in  a week.   CONSULTATIONS:  None.   PROCEDURES:  None.   LABORATORY DATA:  White blood cell count on admission 14,000, hemoglobin  11.8, hematocrit 36, and platelet count 184.  At discharge, white count  is 8000.  Admission INR is 1.9; at discharge is 2.2.  Complete metabolic  panel on admission significant for an albumin of 3.0, SGOT of 42,  creatinine of 1.44.  His creatinine peaked at 1.7 and at discharge is  1.16.  Cardiac enzymes showed a peak troponin of 0.9.  BNP was 228.   SPECIAL STUDIES:  Radiology:  Chest x-ray showed left lower lobe  pneumonia.  CT brain showed nothing acute.  Multiple bilateral infarcts,  atrophy, and chronic microvascular white matter disease.   HISTORY AND HOSPITAL COURSE:  [name] is a  65 year old white male  with multiple medical problems who presented with shortness of breath.  He was found to have a temperature of 103.1 rectally, otherwise  unremarkable vital signs.  He had no JVD.  He had bilateral bibasilar  rales on lung exam.  He was somewhat lethargic.  He was found to have a  leukocytosis and a pneumonia.  He was started on broad-spectrum  antibiotics.  He had been hospitalized recently and was therefore  treated for possible hospital-acquired pneumonia.  His mental status,  fevers, leukocytosis, all improved.  His Coumadin was adjusted to keep  his INR above 2.  His furosemide and ACE inhibitor were held briefly due  to the renal insufficiency.  He was discharged by Dr. Rito Ehrlich in stable  condition and will need to follow up with [facility].      Corinna L. Lendell Caprice, MD  Electronically Signed     CLS/MEDQ  D:  06/26/2008  T:  06/27/2008  Job:  [id number]

## 2010-10-24 NOTE — Discharge Summary (Signed)
NAME:  [name], [name]:  [id number]   MEDICAL RECORD NO.:  [id number]                   PATIENT TYPE:  OBV   LOCATION:  3727                                 FACILITY:  [facility]   PHYSICIAN:  Darden Palmer., M.D.         DATE OF BIRTH:  [birth year]   DATE OF ADMISSION:  08/30/2003  DATE OF DISCHARGE:  08/31/2003                                 DISCHARGE SUMMARY   FINAL DIAGNOSES:  1. Atrial fibrillation, resolved.  2. Congestive heart failure, improved.  3. Cardiomyopathy, combined idiopathic and ischemic.  4. Prosthetic Bjork-Shiley mitral valve.  5. Chronic Coumadin anticoagulation with marked fluctuations in INR ratios.  6. History of multiple arterial emboli, secondary to prosthetic valve.  7. Hyperlipidemia.  8. Cigarette abuse.   HISTORY:  This 65 year old male has a history of recurrent atrial  fibrillation and flutter, and has had an atrial flutter ablation.  He had  previously been doing well, but does have a history of smoking, but states  that he has quit recently.  He was admitted to the hospital with increasing  shortness of breath and was found to be in atrial fibrillation.  He had an  atrial flutter ablation previously and has had recurrent admissions with  atrial fibrillation since that time.  He has a known history of coronary  artery disease and has a circumflex stent and a right coronary artery  occlusion.  He was admitted with exacerbation of congestive heart failure.  Please see the previously dictated history and physical for remainder of the  details.   HOSPITAL COURSE:  His BNP level was 568, sodium is 140, potassium 3.5,  glucose 100, albumin was 3.2, magnesium was 2.2, troponin was .02.  Protime  INR was 1.9.  Hemoglobin 11.3, hematocrit 33.  The patient was placed on IV  Lasix and IV Cardizem.  Oxygen was administered.  He was diuresed some and  his Amiodarone was increased.  TSH and Amiodarone levels were evaluated.   He  had his Lasix increased to twice daily.  We were going to cardiovert him,  but he spontaneously converted back to normal sinus rhythm, and it was opted  to send him home the next day since he was asymptomatic and feeling well.  He was discharged the next day in improved condition on Coreg 6.25 mg  b.i.d., Zocor 20 mg daily, aspirin 81 mg daily, Coumadin 7.5 mg daily,  except 5 mg on Friday, Lasix 40 mg daily, Accupril 10 mg daily, Zocor 20 mg  daily.  He is to not smoke and is to weigh daily.  He is to call if he has  recurrent problems.  He will be seen on Tuesday for protime check, and I  will see him in the office in two weeks.  Darden Palmer., M.D.    WST/MEDQ  D:  08/31/2003  T:  09/01/2003  Job:  [id number]

## 2010-10-24 NOTE — Consult Note (Signed)
NAME:  [name], [name] NO.:  [id number]   MEDICAL RECORD NO.:  [id number]          PATIENT TYPE:  INP   LOCATION:  4741                         FACILITY:  [facility]   PHYSICIAN:  Duke Salvia, MD, FACCDATE OF BIRTH:  Nov 08, 1945   DATE OF CONSULTATION:  06/15/2006  DATE OF DISCHARGE:                                 CONSULTATION   Thank you very much for asking [place] to see [name] in  anticipation of his mitral valve redo replacement scheduled for  tomorrow.   He is a 65 -year-old gentleman with a history of a Bjork-Shiley valve  implanted about 25 years ago, who has been found to have a partially  thrombosed valve, multiple recurrent arterial emboli, and recent stroke,  and left atrial appendage clot found at [facility].  He also has depressed left  ventricular function with ejection fraction of 25-35% with  nonobstructive coronary disease on the left side with a patent stent  site and an occluded right coronary artery that was felt to be small.   His arrhythmia history is notable for an atrial flutter ablation  undertaken by Dr. Lewayne Bunting a couple years ago.  Recurrent flutter  was felt to be left-sided.  Consideration was given to ablation of it,  but in the setting of his severe mitral valve disease, it was felt to be  not likely beneficial done transvenous.  The patient has been submitted  for repeat cardioversions, most recently earlier this summer.   As best I can tell, he has been exposed to antiarrhythmic drug therapy,  specifically amiodarone but this has not be continued and I do not have  the details available to me as to that.     He is also status post pacemaker implantation with a [employer] and  Rhythm device, about 3 years ago.  He has underlying left bundle branch  block.   His past medical history is notable for:  1. Multiple embolic strokes and extremity emboli as well intra-      abdominal emboli requiring laparotomies.  2.  Noncompliance with his Coumadin.  3. Hypertension.  4. Dyslipidemia.  5. A history of ethanol abuse.  6. Recurrent psychiatric admissions.   SOCIAL HISTORY:  He is divorced.  He has 2 children who are somewhere  around.  He lives by himself with a couple of dogs.  He is retired from  [employer].  He does not smoke.  He does use alcohol.  He denies  recreational drugs.   CURRENT MEDICATIONS:  1. Lasix.  2. Aspirin.  3. Carvedilol 12.5 b.i.d.  4. Lanoxin 0.25.  5. Lisinopril 10.  6. Zetia 10.   HE HAS NO KNOWN DRUG ALLERGIES.   PHYSICAL EXAMINATION:  GENERAL:  He is a middle-aged Caucasian male  appearing somewhat older than his stated age of 41.  VITAL SIGNS:  His blood pressure is 100/60.  His pulse was 68 and  irregular.  HEENT:  Demonstrated no icterus or xanthoma.  NECK:  His neck veins were 7-to-8-cm.  His carotids were brisk and full  bilaterally without bruits.  BACK:  Without kyphosis, scoliosis.  LUNGS:  Clear.  HEART:  Sounds were regular with a displaced PMI and a mechanical S1 and  a 2/6 systolic murmur.  ABDOMEN:  Distended but soft.  EXTREMITIES:  Trace edema.  NEUROLOGIC:  Grossly normal.  SKIN:  Warm and dry.   Electrocardiogram, dated 07 June 2006, demonstrated ventricular  pacing with intermittent conduction.  Electrocardiogram from November 19, 2005, demonstrates atrial fibrillation, again with left bundle branch  block.  There is a nonspecific IVCD at baseline  with a QRS duration of  about 120 milliseconds.   IMPRESSION:  1. Atrial fibrillation/atrial flutter, recurrent , having failed      amiodarone in the past.  2. Mitral valve disease, status post Bjork-Shiley in 1982 or so,      scheduled for redo in the morning secondary to partial valve      obstruction.  3. Cardiomyopathy, largely nonischemic with a total right but a small      vessel.  Ejection fraction is estimated between 20-35%.  4. Left atrial clot.  5. Multiple arterial embolic  events, including:      a.     Strokes, including recent right brain.      b.     Extremity.      c.     Intra-abdominal, the latter per Dr. York Spaniel note.  6. Intraventricular conduction delay and ventricular pacing (majority      of the time).  7. History of RF catheter ablation for atrial flutter but not as the      chart records AV node.  8. Class III congestive heart failure.  9. Ethanol use.  10.Recurrent psychiatric admissions.   [name] is going for mitral valve replacement, redo in the morning.  This is undoubtedly a big surgery with all the scarring that will likely  have occurred.  In the event that the procedural morbidity and mortality  risks attendant with intraoperative surgical maze are not excessive, I  would encourage that as there is some likelihood that sinus rhythm can  be restored and maintained either in the absence or the presence of drug  therapy.  Left atrial __________ if can be accomplished is worth also  doing as thromboembolic risks would then be diminished from that source.  This is particularly important in a gentleman for whom anticoagulation  has been a very difficult undertaking.   In addition, with his bundle branch block and his heart failure and his  predominant paced rhythm, I would ask the surgeons to place two lateral  epicardial leads if possible.  These areas are hard to access in the  setting of a scarred heart, anterior placement or proximal placement is  probably not sufficient.  I would not change the generator out at this  time, however, as a decision needs to be decided down the road as to an  ICD and that would require an endovascular lead.   Based on the above,  I would therefore:  1. Undertake the placing of two LV epicardial leads on the lateral      wall halfway between base and apex (as close as possible) and      tunnel these leads so the pacemaker pocket.  I would not change of     the generator at this time because the  decision will need to be      answered about the placement of a transvenous defibrillator lead.  2. If surgical morbidity, mortality is  not excessive, would as Dr.      Dorris Fetch to consider a surgical maze procedure to try to restore      sinus rhythm.  I should note that a right atrial flutter line need      not be done because that has been done transvenous already via      catheters.   Thank you very much for this consultation.      Duke Salvia, MD, [facility]  Electronically Signed     SCK/MEDQ  D:  06/16/2006  T:  06/16/2006  Job:  [id number]   cc:   Georga Hacking, M.D.  Salvatore Decent Dorris Fetch, M.D.

## 2010-10-24 NOTE — Op Note (Signed)
NAME:  [name], [name]                         ACCOUNT NO.:  [id number]   MEDICAL RECORD NO.:  [id number]                   PATIENT TYPE:  INP   LOCATION:  3303                                 FACILITY:   PHYSICIAN:  Quita Skye. Hart Rochester, M.D.               DATE OF BIRTH:  [birth year]   DATE OF PROCEDURE:  07/07/2002  DATE OF DISCHARGE:                                 OPERATIVE REPORT   PREOPERATIVE DIAGNOSIS:  Ischemic right foot with occlusion of right  popliteal to posterior tibial bypass.   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   OPERATION:  1. Insertion of a right popliteal to peroneal reverse saphenous vein graft     from right leg.  2. Thrombectomy of right popliteal to posterior tibial saphenous vein graft     with re-implantation into the above mentioned vein graft.  3. Intraoperative arteriogram.   SURGEON:  Quita Skye. Hart Rochester, M.D.   FIRST ASSISTANT:  Coral Ceo, P.A.   ANESTHESIA:  General endotracheal.   PROCEDURE:  The patient was taken to the operating room and placed in the  supine position at which time satisfactory general endotracheal anesthesia  was administered.  The right leg was prepped with Betadine scrub and  solution, draped in routine sterile manner.  The skin clips were removed  from the below knee incision in the right leg and the popliteal fossa below  the knee exposed.  There was thrombosis of the popliteal to posterior tibial  vein graft and thrombus in the posterior tibial artery as well.  There was  an excellent pulse in the native popliteal artery down to and including the  origin of the anterior tibial artery which was chronically occluded about 8  cm distally.  Vesseloops were placed around the posterior tibial artery and  the peroneal artery was exposed slightly distal to this through the same  incision.  It was a small slightly thickened vessel, but was patent on the  angiogram.  Saphenous vein was then removed beginning from the knee on up  proximally almost to the saphenofemoral junction through two separate  incision.  Its branches were ligated four and five as their paths divided.  It was removed gently and dilated with heparinized saline and marked for  orientation purposes.  The patient then heparinized.  Saphenous vein graft  was completely removed from the popliteal artery and a 3 Fogarty catheter  passed distally down into the posterior tibial artery to the level of the  foot.  Upon return core of thrombus was retrieved followed by excellent back  bleeding.  Additional passes with the Fogarty yielded no further clot and  was easily flushed with heparin saline.  This was preserved for later re-  implantation.  The new vein graft was used in reverse fashion and was  spatulated and anastomosed into the side to the  below knee popliteal artery  and there was excellent inflow.  It was anastomosed in the side to the  peroneal artery.  After opening the peroneal artery with a 15 blade,  extended with Potts scissors it would accept a 2.5 mm dilator distally.  An  anastomosis was done with 6-0 Prolene to the heel and interrupted 7-0  Prolene to the toe.  Following this, the previous vein graft to the  posterior tibial artery was re-implanted into the proximal portion of the  popliteal to peroneal vein graft and this was done.  The vein graft of the  posterior tibial artery was then re-implanted into the vein graft from the  popliteal to the peroneal artery after opening the vein graft to the  peroneal artery longitudinally up near the proximal anastomosis spatulating  the second vein graft and anastomosing it into to side with  6-0 Prolene.  Clamps were then released and there was excellent pulse in both vein grafts  and excellent Doppler flow in the foot in the posterior tibial and peroneal  arteries.  Intraoperative  arteriogram revealed widely patent  anastomoses with runoff through the  peroneal and posterior tibial arteries.  No protamine was given.  Wound was  irrigated with saline and closed in layers with Vicryl and clips.  Sterile  dressing applied.  The patient taken to the recovery room in satisfactory  condition.                                               Quita Skye Hart Rochester, M.D.    JDL/MEDQ  D:  07/07/2002  T:  07/07/2002  Job:  [id number]

## 2010-10-24 NOTE — Discharge Summary (Signed)
[name], [name] NO.:  [id number]   MEDICAL RECORD NO.:  [id number]          PATIENT TYPE:  INP   LOCATION:  2031                         FACILITY:  [facility]   PHYSICIAN:  Georga Hacking, M.D.DATE OF BIRTH:  08-30-1945   DATE OF ADMISSION:  10/06/2005  DATE OF DISCHARGE:  10/10/2005                                 DISCHARGE SUMMARY   FINAL DIAGNOSES:  1.  Recurrent atrial fibrillation.  2.  Prosthetic mitral valve.  3.  Coronary artery disease, with previous stenting.  4.  Permanent pacemaker.  5.  Congestive heart failure.  6.  History of alcoholism.  7.  History of cigarette abuse.  8.  Hyperlipidemia.  9.  Hypertension.   PROCEDURE:  Cardioversion.   CONSULTATIONS:  Doylene Canning. Ladona Ridgel, M.D.   HISTORY:  The patient is a 65 year old male who has had multiple admissions  for recurrent atrial fibrillations and multiple cardioversions, most  recently September 25, 2005.  He presented to the hospital with rapid regular  heartbeat, shortness of breath, where he was found to be in atrial  fibrillation with a ventricular rate of 130.  He was given intravenous  diltiazem but continued with atrial fibrillation and is admitted for further  evaluation.  Please see the previously dictated history and physical for the  remainder of the details.   HOSPITAL COURSE:  The patient had just had a cardioversion done 10 days  earlier.  An amiodarone level from that admission showed an amiodarone level  to be subtherapeutic.  Chest x-ray showed mild edema.  EKG initially showed  atrial fibrillation.  Serial CPK and MBs were negative, as well as  troponins.  Chemistry panel showed a BUN of 19, creatinine of 1.4, and with  diuresis rose to 33 and 2.1.  Liver enzymes were normal.  Protime/INR was  1.8 on admission, and on discharge was 3.  CBC was normal.  The patient was  admitted and was seen in consultation by Dr. Lewayne Bunting.  Dr. Ladona Ridgel noted  that he did have recurrent  atrial arrhythmias and class III heart failure  and atrial fibrillation.  He recommended oral loading of amiodarone with  repeat cardioversion.  If he could not be maintained in sinus rhythm, then  AV nodal ablation and a biventricular ICD would be indicated.  He remained  in atrial fibrillation, but was rapid at times, needing cardioversion.  He  was loaded with amiodarone orally and had cardioversion done on Oct 09, 2005,  with 120 watts resulting in reversion to sinus rhythm.  He was doing well  and was discharged the next morning in improved condition.   DISCHARGE MEDICATIONS:  1.  Coumadin 7.5 mg daily.  2.  Amiodarone 200 mg b.i.d.  3.  Coreg 6.25 b.i.d.  4.  Lasix 40 mg daily.  5.  Vytorin 10/40 mg daily.  6.  Quinapril 10 mg daily.   He is to follow up in one week, and arrangements were also given for home  health to follow him for compliance and heart failure management.      Leeroy Bock  Donnie Aho, M.D.  Electronically Signed     WST/MEDQ  D:  11/05/2005  T:  11/05/2005  Job:  [id number]   cc:   Marcene Duos, M.D.  Fax: [phone]

## 2010-10-24 NOTE — Op Note (Signed)
NAME:  [name], [name] NO.:  [id number]   MEDICAL RECORD NO.:  [id number]                   PATIENT TYPE:   LOCATION:                                       FACILITY:   PHYSICIAN:  Quita Skye. Hart Rochester, M.D.               DATE OF BIRTH:  December 11, 1945   DATE OF PROCEDURE:  07/05/2002  DATE OF DISCHARGE:                                 OPERATIVE REPORT   PREOPERATIVE DIAGNOSIS:  Probable embolus right popliteal artery with atrial  fibrillation.   PROCEDURE:  Abdominal aortogram with bilateral lower extremity run off via  right common femoral approach.   SURGEON:  Quita Skye. Hart Rochester, M.D.   ANESTHESIA:  Local Xylocaine.   COMPLICATIONS:  None.   Contrast:  130 cc.   DESCRIPTION OF PROCEDURE:  The patient was taken to the [facility]  peripheral endovascular laboratory, placed in the supine position at which  time both groins were prepped with Betadine solution and draped in the  routine sterile manner.  After infiltration of 1% Xylocaine the right common  femoral artery was entered percutaneously, guide wire passed in to the  suprarenal aorta under fluoroscopic guidance.  A 5-French sheath and dilator  were passed over the guide wire, dilator removed and standard pigtail  catheter positioned in the suprarenal aorta.  Flushed abdominal aortogram  was performed injecting 20 cc of contrast at 20 cc per second.  This  revealed the aorta to be widely patent, single renal arteries bilaterally  which were widely patent.  The internal iliac arteries were normal and  patent bilaterally.  The inferior mesenteric artery was also patent and  there was minimal atherosclerotic changes in both common and external iliac  arteries.  Catheter was withdrawn in the terminal aorta.  Bilateral lower  extremity run off performed injecting 88 cc of contrast at 8 cc per second.  This revealed the left common femoral, superficial femoral and profundus  femoris arteries to be  patent.  There was a high origin of the anterior  tibial artery originating just below the knee joint on the left which filled  the leg distally.  The posterior tibial and peroneal arteries on the left  were also patent but less well visualized.  On the right side the common  femoral and superficial femoral arteries appeared patent down to the knee  joint.  At the knee joint there was an acute occlusion suspicious for an  embolus.  There was no filling of the popliteal artery below the knee.  The  collaterals did fill the posterior tibial and what appeared to be the  peroneal arteries beginning about four inches below the knee joint.  Additional views were obtained using the peak hole technique after removing  the pigtail catheter to further define the run off on the right leg.  Having  tolerated the procedure well the sheath was removed, adequate compression  applied.  No complications ensued.   FINDINGS:  1. Normal aortoiliac system.  2. Abrupt occlusion right popliteal artery at knee joint with reconstitution     of peroneal and posterior tibial arteries.  3. High origin of left anterior tibial artery below the knee joint on the     left.                                              Quita Skye Hart Rochester, M.D.   JDL/MEDQ  D:  07/05/2002  T:  07/05/2002  Job:  [id number]
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
## 2010-10-24 NOTE — Discharge Summary (Signed)
[name]  Patient:    [name], [name]                      MRN: [id number] Adm. Date:  [id number] Disc. Date: 06/08/00 Attending:  Eleanora Neighbor Dictator:   Jennet Maduro. Earl Gala, R.N., A.N.P. CC:         Darden Palmer., M.D.   Discharge Summary  DISCHARGE DIAGNOSES: 1. Recurrent atrial fibrillation most likely due to noncompliance with    subsequent spontaneous conversion back to normal sinus rhythm. 2. Subtherapeutic INR with re-initiation of Coumadin.  SECONDARY DISCHARGE DIAGNOSES: 1. History of mitral valve replacement with the Bjork-Shiley mitral    valve in 1982, per Dr. Nedra Hai. 2. Intermittent atrial fibrillation/flutter, previously on amiodarone    with spontaneous discontinuation per the patient. 3. Chronic Coumadin therapy. 4. Prior history of cerebrovascular accident in 1990, 1992, 1995, and    1996. 5. History of decreased left ventricular function.  HISTORY OF PRESENT ILLNESS:  [name] is a 65 year old male who has had previous mitral valve replacement in 1982.  He has had history of paroxysmal atrial fibrillation and has had prior cardioversion and has been maintained on amiodarone in the past which he has subsequently discontinued.  He has been noncompliant with his medicines.  It has been several months since he has had his last pro time or evaluation in the office.  He presents with onset of palpitations and recurrent atrial fibrillation and was subsequently admitted. Please see the dictated history and physical per Dr. Roger Shelter for further patient presentation and profile.  LABORATORY DATA:  On admission, troponin I, the first was 0.07, the second was 0.42.  Cardiac enzymes, first CK total 91 with an MB of 2.5 and the second was 95 with an MB of 5.1.  The INR was subtherapeutic at 1.1, hematocrit 36, white count 11.  Chest x-ray is pending.  The EKG showed atrial fibrillation/flutter, later on with  conversion to normal sinus rhythm.  He demonstrated lateral T-wave changes.  HOSPITAL COURSE:  The patient was admitted.  He was begun on IV heparin as well as low dose Cardizem drip.  He subsequently had spontaneous conversion to normal sinus rhythm.  Prior to that time, he had a few pauses ranging from 1.84 to 2.86 seconds.  His Cardizem was subsequently changed over to oral dosing form.  Coumadin was restarted from admission.  It should be noted that the patient denies having trouble actually obtaining his medicines, and basically states that he "just doesnt want to take them."  It is our plan at this time that he will continue on IV heparin and Coumadin and if his INR begins to extend, it is felt that he would be satisfactory for discharge, on June 09, 1999.  He is noted to be so noncompliant with his pro times and Coumadin that fine tuning his Coumadin is fruitless while here in the hospital.  It is felt that a higher dose in the past which has been documented at approximately 11 mg a day, is somewhat unsure at this time, so we will plan on 7.5 mg a day, and hopefully, he will proceed through with follow-up.  He will be deemed stable for discharge rounds on June 08, 2000.  DISCHARGE CONDITION:  Stable.  DISCHARGE MEDICATIONS: 1. Lanoxin 0.25 daily. 2. Coumadin 5 mg tablets to take 1-1/2 tablets daily. 3. Cardizem CD 120 mg daily.  FOLLOW-UP:  Pro time in  the office on Friday, January 4, and asked him to see Dr. Donnie Aho in two weeks and he is asked to call to schedule that appointment. D:  06/07/00 TD:  06/07/00 Job: [id number]

## 2010-10-24 NOTE — H&P (Signed)
[facility]  Patient:    [name], [name] Visit Number: [id number] MRN: [id number]          Service Type: EMS Location: ED Attending Physician:  Osvaldo Human Dictated by:   Maryelizabeth Rowan, M.D. Admit Date:  06/22/2001   CC:         Elvina Sidle, M.D.  Darden Palmer., M.D.  Venita Lick. Pleas Koch., M.D. [facility]   History and Physical  DATE OF BIRTH:  [birth year]  CHIEF COMPLAINT:  Abdominal pain.  HISTORY OF PRESENT ILLNESS:  This 65 year old white male with past medical history of coronary artery disease, acute MI, CVAs, alcohol abuse and hypercholesterolemia awoke at approximately 5 a.m. this morning complaining of bad epigastric/abdominal pain.  He states it worsened over the next couple of hours and patient presented to the [facility] ER.  Patient states that abdominal pain was not worsened by eating breakfast at all.  He did have some nausea but no vomiting.  Denies any diarrhea and also denies fever or chills. When patient presented to the [facility] ER, he did have a workup that revealed no significant electrolyte abnormalities, however, an INR of 18.  He had an abdominal workup of an abdominal ultrasound which was reported as negative and an abdominal CT that showed bowel wall thickening, questionable hematoma or intussusception and also small-bowel obstruction.  Patient was transferred to [facility] for admission on [facility]  PAST MEDICAL HISTORY:  Past medical history includes acute MI in October of 2002; cardiac cath with angioplasty and stenting in 2002; history of CHF and coronary artery disease; atrial fibrillation, which is chronic; he did have D-C cardioversion attempted in 2000 and 2002 with no success; history of CVAs; hypercholesterolemia; history of alcohol abuse; depression with psychotic features in 1999.  PAST SURGICAL HISTORY:  Surgical history includes prosthetic  York-Shiley mitral valve replacement in 1982, cardiac cath in October of 2002 with an ejection fraction of 20-25% and elevated left ventricle with hypokinesis of the apices and inferior wall.  He has had a right inguinal hernia repair in 1992 and a vasectomy.  HOSPITALIZATIONS:  Multiple for CHF exacerbations, most recent in October of 2002 for chest pain, diagnosed with acute MI and taken to cardiac cath.  MEDICATIONS:  Patient is unsure of current regimen, does, however, state he is on Lanoxin 0.25 mg p.o. q.d. and Coumadin 5 mg p.o. q.d.  He is also, according to his records, to be on Cardizem CD 125 mg p.o. q.d., Accupril and Toprol as well as aspirin, however, patient is unsure which of these medications he is currently taking.  ALLERGIES:  No known drug allergies per patient.  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  He is currently divorced and has two children.  Tobacco:  Smokes one pack per day.  Alcohol:  A binge drinker with 6- to 12-pack on weekends occasionally.  FAMILY HISTORY:  Significant for coronary artery disease.  Dad deceased with an MI at age 65; mom deceased with an MI at age 32; older brother with rheumatic fever and heart disease.  Children healthy.  REVIEW OF SYSTEMS:  CONSTITUTIONAL:  Denies any fever, chills.  Denies any decreased appetite and, in fact, has had some weight gain of 20 pounds in the last six months to a year.  NEUROLOGIC:  Denies headache, changes in vision, blurry vision, difficulty with coordination or ataxia.  Denies any weaknesses or dysesthesia.  EYES:  Denies any visual changes.  HEENT:  Denies any otalgia.  Positive rhinorrhea x2-3 weeks, worsening in the a.m., clearing through the day.  RESPIRATORY:  Denies any cough.  Does state he has had occasional wheezes.  Denies dyspnea on exertion or shortness of breath. States he is able to walk five-plus stairs without any problem.   Denies orthopnea or paroxysmal nocturnal dyspnea.  Denies chest pain.  Does state he has had some palpitations in the past.  GI:  Complains of abdominal pain and distention, some nausea and has vomited twice in the [place] after receiving oral contrast for CT.  Denies diarrhea or constipation.  UROLOGY: Denies any dysuria, pyuria, hematuria or difficulty with urinary stream. EXTREMITIES:  Denies any joint pain or peripheral edema.  HEMATOLOGY:  Denies any easy bruisability.  PHYSICAL EXAMINATION:  VITALS:  Blood pressure 161/85, pulse 72, respirations 20, temperature 96.6, saturations 96% on room air.  GENERAL:  NAD.  A&O x3.  Appears fatigued.  Very poor historian.  HEENT:  [place]/AT.  PERRLA.  EOMI.  Conjunctivae clear.  Sclerae anicteric.  Ears: External auditory canals are patent.  TMs intact, nonerythematous.  Oropharynx nonerythematous, without exudate.  Dentures on upper and lower.  NECK:  Supple.  No lymphadenopathy, thyromegaly or JVD.  LUNGS:  Clear to auscultation.  No wheezes, rhonchi or rales.  HEART:  Irregularly irregular rhythm.  No rubs or gallops appreciated.  ABDOMEN:  Positive bowel sounds and also slightly distended abdomen.  He is generally tender to palpation.  He does have some voluntary guarding.  No significant rebound.  Hepatosplenomegaly difficult to assess due to his voluntary guarding.  EXTREMITIES:  No peripheral edema appreciated.  Full range of motion. Strength 5/5 and equal bilaterally.  RECTAL:  Somewhat nodular prostate.  Heme-negative.  NEUROLOGIC:  Cranial nerves II-XII intact.  Reflexes 2+/4 and equal bilaterally, upper and lower extremities.  LABORATORY AND ACCESSORY DATA:  Significant labs include a white count of 16.4, INR of 18 while seen in the [facility] ED and without any therapeutic; twelve hours later, this has dropped to 13.9.  Other electrolytes within  normal limits including liver function tests.  UA significant for a  large amount of blood; no nitrite, LE.  EKG consistent with atrial fibrillation.  Abdominal ultrasound negative and CT as above per ED physician.  KUB ordered here at [facility] showed no small-bowel obstruction, significant amount of stool in the right colon but otherwise within normal limits.  ASSESSMENT:  This 65 year old white male with extensive cardiac history presents with: 1. Abdominal pain and distention.  Patient was given an nasogastric tube    placed on low intermittent suction.  Will follow to see if this provides    any relief of his distention.  Will keep patient n.p.o. and follow serial    abdominal exams.  Patient does not show significant signs of acute abdomen,    however, will consider surgical consult in the morning if patient is not    significantly improved. 2. Hypercoagulability.  Patients INR has gone from 18 to 13.9.   Will give a    dose of 2 mg of vitamin K p.o. right now and check an INR in the morning.    Will hold Coumadin for now.  Of note is that patient apparently has a    history of coming in with increased INR in the past and has had difficulty    with medication compliance. 3.  Leukocytosis.  No significant signs or symptoms of infection currently.    Will follow serial abdominal exams and consider subacute bacterial    endocarditis as always a potential cause. 4. Atrial fibrillation.  Will continue digoxin and Cardizem.  Will hold    Coumadin as above.  Will continue to monitor patient on telemetry. 5. Hypertension.  Will continue an ACE inhibitor as tolerated as well as his    calcium channel blocker and continue to monitor his therapy. 6. Status post mitral valve replacement.  Will monitor the PT and INR closely    and will be very careful with his vitamin K therapy. 7. History of congestive heart failure exacerbation.  Continuing digoxin and    Cardizem as above.  Will give gentle fluid resuscitation for this man who    has an ejection fraction of  20-25%. 8. Hematuria and nodular prostate.  Will check a PSA in the morning and follow    up with a urinalysis as needed. Dictated by:   Maryelizabeth Rowan, M.D. Attending Physician:  Osvaldo Human DD:  06/23/01 TD:  06/23/01 Job: [id number]

## 2010-10-24 NOTE — Cardiovascular Report (Signed)
NAMEZETHAN, ALFIERI NO.:  [id number]   MEDICAL RECORD NO.:  [id number]          PATIENT TYPE:  INP   LOCATION:  4741                         FACILITY:  [facility]   PHYSICIAN:  Georga Hacking, M.D.DATE OF BIRTH:  [birth year]   DATE OF PROCEDURE:  06/14/2006  DATE OF DISCHARGE:                            CARDIAC CATHETERIZATION   HISTORY:  A 65 year old male with previous Bjork-Shiley mitral valve who  has had recurrent arterial emboli center recent stroke.  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  There was no  significant mitral regurgitation.  This study is done to assess his  coronary arteries, because he has a previous history of coronary artery  disease and to measure right heart pressures at the request Dr.  Dorris Fetch.   PROCEDURE:  Right heart catheterization with measurement of cardiac  output pressures, left heart catheterization with coronary angiography.  Left ventriculogram was not performed.   HEMODYNAMIC DATA:  Please 07/03 Colace first on Chamber second, pressure  of her, percent saturation   CHAMBER  PRESSURE  PERCENT SATURATION  Right Atrium                        11  Right Ventricle                     40/16  Pulmonary Artery                    40/21                         58%  Pulmonary capillary wedge pressure  21  Aorta post contrast                 118/88                        95%  Left ventricle                      not performed.   ANGIOGRAMS:  Left main coronary artery has mild 30% ostial narrowing.  There was no significant coronary calcification noted.  The left  anterior descending has a mild ostial stenosis noted.  There is no  significant obstructive disease noted, but the vessel is diffusely  narrowed in the mid portion.  The circumflex artery:  Previously placed  stents were widely patent.  There is no significant luminal narrowing.  The  right coronary artery is occluded proximally and appears be somewhat  small.  Collateral filling is seen at the distal vessel.   IMPRESSION:  1. Mild to moderate pulmonary hypertension.  2. Long-term patency of the stented site of the circumflex coronary      artery, occlusion of the right coronary artery, mild ostial left      main stenosis.      Georga Hacking, M.D.  Electronically Signed    WST/MEDQ  D:  06/14/2006  T:  06/14/2006  Job:  [id number]   cc:   Salvatore Decent. Dorris Fetch, M.D.

## 2010-10-24 NOTE — Op Note (Signed)
[name], [name] NO.:  [id number]   MEDICAL RECORD NO.:  [id number]          PATIENT TYPE:  INP   LOCATION:  3713                         FACILITY:  [facility]   PHYSICIAN:  Doylene Canning. Ladona Ridgel, M.D.  DATE OF BIRTH:  08/13/1945   DATE OF PROCEDURE:  08/12/2004  DATE OF DISCHARGE:                                 OPERATIVE REPORT   PROCEDURE PERFORMED:  Implantation of dual-chamber pacemaker.   INDICATIONS:  Symptomatic tachy-brady syndrome.   INTRODUCTION:  The patient is a 65 year old man with a long history of the  mitral valve disease status post mitral valve placed with St. Jude mitral  valve. The patient has had a long history of atrial arrhythmias and  underwent catheter ablation of typical atrial flutter. He has developed  recurrent atypical atrial flutter presumably in the left atrium. He was  initially referred for consideration for left atrial flutter ablation but  was deemed not to be a candidate for the secondary to his mechanical mitral  prosthesis. The patient has had recurrent hospital admissions with both  congestive heart failure as well as atrial flutter with rapid ventricular  response. He has been difficult control with a rate controlling medications  now referred for permanent pacemaker for antitachycardia pacing capability.   PROCEDURE:  After informed consent was obtained, the patient is taken to the  diagnostic EP lab in fasting state. After the usual preparation and draping,  intravenous fentanyl and midazolam was given for sedation. Lidocaine 30 mL  was infiltrated the left infraclavicular region. A 5 cm incision was carried  out over this region. Electrocautery utilized to dissect down the fascial  plane. There was 10 mL of contrast injected in the left upper extremity  venous system demonstrating a patent left subclavian vein.  It was  subsequently punctured x2 and the Medtronic model 5076 58-cm active fixation  pacing lead, serial number  [id number] was advanced into the right ventricle.  The Medtronic model 5076 52-cm fixation pacing lead, serial number  [id number] was advanced into the right atrium. Mapping was carried out in  the right ventricle at the final site near the RV apex. The R-waves measured  26 mV and the pacing threshold 0.9 volts at 0.5 milliseconds. The pacing  impedance was 761 ohms and 10 volt pacing did not stimulate the diaphragm.  With the ventricular lead in satisfactory position, attention was then  turned placement of the atrial lead. It was placed in the right atrium where  flutter waves measured 2 mV and the pacing impedance was 942 ohms with lead  actively fixed. Again, 10 volt pacing did not stimulate the diaphragm. With  both atrial and ventricular leads in satisfactory position, they were  secured to the subpectoralis fascia with a figure-of-eight silk suture.  Sewing sleeve was secured with silk suture. Kanamycin irrigation was  utilized to irrigate the pocket. Electrocautery utilized to assure  hemostasis. The Medtronic Enrhythm model [id number] dual-chamber pacemaker,  serial number [id number] was connected to the atrial and ventricular pacing  leads and placed in the subcutaneous pocket. Generator secured with silk  suture.  Kanamycin irrigation was utilized to irrigate the pocket.  Electrocautery utilized to assure hemostasis. The generator was secured with  silk suture. Incision was closed with layer of 2-0 Vicryl followed by a  layer of 3-0 Vicryl, followed by a layer of 4-0 Vicryl. Benzoin was painted  on the skin.  Steri-Strips were applied and pressure dressing was  placed.  The patient was returned to his room in satisfactory condition.   COMPLICATIONS:  There were no immediate procedure complications.   RESULTS:  This demonstrated successful implantation of a Medtronic dual-  chamber pacemaker in a patient with symptomatic tachy-brady syndrome.      GWT/MEDQ  D:  08/12/2004  T:   08/12/2004  Job:  [id number]

## 2010-10-24 NOTE — Discharge Summary (Signed)
NAME:  [name], [name] NO.:  [id number]   MEDICAL RECORD NO.:  [id number]          PATIENT TYPE:  INP   LOCATION:  2013                         FACILITY:  [facility]   PHYSICIAN:  Ramiro Harvest, MD    DATE OF BIRTH:  August 24, 1945   DATE OF ADMISSION:  02/23/2008  DATE OF DISCHARGE:  02/29/2008                               DISCHARGE SUMMARY   ATTENDING PHYSICIAN:  Ramiro Harvest, MD   PRIMARY CARE PHYSICIAN:  Lavonda Jumbo, MD of [facility].   DISCHARGE DIAGNOSES:  1. Acute on chronic systolic heart failure, congestive heart failure      exacerbation.  2. Right-sided transient ischemic attack.  3. Subtherapeutic INR.  4. Hypertension.  5. Atrial fibrillation with rapid ventricular response.  6. Ischemic and nonischemic severe cardiomyopathy.  7. Status post recent upgrade of permanent pacemaker to implantable      cardioverter defibrillator by defibrillator and pacemaker.  8. Chronic congestive heart failure class III improved to class II      following the above.  9. History of atrial fibrillation/atrial flutter status post      atrioventricular nodal ablation subsequent pulse per minute.  10.Status post cardioversion to sinus rhythm with insertion of ICD      (implantable cardioverter defibrillator) June of 2009.  11.Chronic systemic anticoagulation.  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  13.Known left atrial thrombus organized by TEE of February and June of      2009.  14.Chronic amiodarone therapy.  15.History of remote Bjork-Shiley mitral valve replacement with a redo      in January of 2008 tissue valve.  16.History of noncompliance with Coumadin.  17.Hyperlipidemia.  18.Coronary artery disease status post percutaneous coronary      intervention/stent implantation to the left circumflex in 2005.   DISCHARGE MEDICATIONS:  1. Coumadin 5 mg p.o. daily.  2. Digoxin 0.25 mg p.o. daily.  3. Lisinopril  5 mg p.o. daily.  4. Potassium chloride 20 mEq p.o. daily.  5. Zetia 10 mg p.o. daily.  6. Aspirin 81 mg p.o. daily.  7. Crestor 20 mg p.o. daily.  8. Coreg 6.25 mg p.o. b.i.d.  9. Lasix 40 mg p.o. daily.   DISPOSITION AND FOLLOWUP:  The patient will be discharged home to follow  up with Dr. Amil Amen of [facility] and in Coumadin clinic on  March 06, 2008, at 2:15 p.m.  The patient is also to follow up with  Dr. Joselyn Arrow in 1-2 weeks post discharge.   CONSULTATIONS:  A neurology consult was done.  The patient was seen in  consultation by Dr. Pearlean Brownie of [facility] on February 24, 2008.  A cardiology consult was also done on February 24, 2008.  The patient  was seen in consultation by Dr. Amil Amen of [facility].   PROCEDURES PERFORMED:  1. A CT of the head without contrast was performed on February 23, 2008, that showed no acute intracranial abnormalities, chronic      changes as stated.  2. A chest x-ray  was obtained on February 23, 2008, that showed no      acute cardiopulmonary process, diffusely increased interstitial      lung markings noted without focal pulmonary opacity.  3. Repeat CT of the head was done on February 25, 2008, that showed      no acute intracranial abnormality, chronic changes are stable.  4. Carotid Dopplers were done on February 27, 2008, that showed      vertebral artery flow antegrade bilaterally.  No significant right      ICA stenosis noted.  No significant left ICA stenosis noted.   BRIEF HOSPITAL HISTORY AND PHYSICAL:  Mr. Jock Mahon is a 65 year old  gentleman with a past medical history significant for class III CHF,  status post AICD, coronary artery disease, multiple embolic CVAs in the  setting of left atrial mural thrombus, MVR, hypertension, atrial  fibrillation on chronic Coumadin who presented to the hospital secondary  to increased shortness of breath and right-sided headache associated  with mild  slurring of speech.  The patient reported 1 day prior to  admission he had increased shortness of breath and noted a brief episode  of feeling like he was drunk, unsteady on his feet which resolved after  he sat down.  On the night of admission around 7:00 p.m. the patient had  right-sided headache with some slurring of speech.  The patient's  headache went away after he received two Vicodin in the ED.  The patient  does not have any current slurring of speech above his baseline or any  other new neurological findings.  Neurology was being consulted for  possible TIA.   ADMITTING PHYSICAL EXAM:  VITAL SIGNS:  Per admitting physician  temperature 96.6, pulse of 63, respiratory rate 20, blood pressure  129/78, sats of 95% on 2 liters.  GENERAL:  The patient was in no apparent distress.  HEENT:  Normocephalic, atraumatic.  Pupils equal, round and reactive to  light and accommodation.  Extraocular movements intact.  Oropharynx is  clear.  No lesions.  No exudates.  NECK:  Neck is supple.  No lymphadenopathy.  CARDIOVASCULAR:  S1-S2, positive 3/6 systolic ejection murmur.  RESPIRATORY:  Mild bibasilar crackles.  No rhonchi.  No wheezing.  ABDOMEN:  Abdomen was soft with some right lower quadrant tenderness.  Positive bowel sounds.  No guarding.  No rebound.  NEUROLOGICAL:  The patient was alert and oriented x3.  Cranial nerves II-  XII were grossly intact.  The patient with some slurring of speech but  at baseline per his son.  The patient with a 5/5 upper bilateral and  lower bilateral extremity strength.  Sensation was intact to light  touch.  Cerebellar was intact with finger-to-nose.  Fine motor was  intact.  Rapid alternating movements also intact and DTRs were intact.  EXTREMITIES:  No clubbing, cyanosis or edema.  GU:  Was deferred with no CVA tenderness.  PSYCHIATRIC:  Appropriate.   ADMISSION LABS:  CBC - white count of 9.1, hemoglobin 12.2, platelets  169, MCV of 87.1, RDW of  17, ANC of 7.0, sodium 141, potassium 4.3,  chloride 110, bicarb 24, BUN 9, creatinine 1.13, glucose of 103.  BNP of  663, calcium of 8.9, CK of 137, CK-MB 3.0, troponin-I less than 0.04.  Second set of cardiac enzymes CK 111, CK-MB 2.7, troponin-I 0.03.  Digoxin less than 0.2.  PT of 15.4, INR 1.2.  Cholesterol 155,  triglycerides 51, HDL 37, LDL 108.  UA was  normal.  Chest x-ray showed  no acute cardiopulmonary process, diffusely increased interstitial lung  markings were noted without any focal pulmonary opacity.  CT of the head  showed chronic changes including encephalomalacia within the right  frontal lobe and left parietal lobe and anterior left temporal lobe,  right basal ganglion lacunar infarct, diffuse patchy low-density  throughout the subcortical and periventricular white matter consistent  with chronic small vessel ischemic changes, prominence of sulci and  ventricles consistent with brain atrophy, paranasal sinuses and mastoid  air cells were all normally aerated with no acute abnormalities.   HOSPITAL COURSE:  1. Acute on chronic systolic CHF exacerbation.  The patient was      admitted for acute on chronic CHF exacerbation based on elevated      BNP and chest x-ray findings as well as his symptoms.  Cardiac      enzymes were cycled which came back negative.  A TSH was also      obtained which was within normal limits.  The patient was placed on      strict I's and O's as well as daily weights and the patient was      diuresed with IV Lasix with improvement in his symptoms.      Cardiology was consulted.  The patient was seen in consultation by      Dr. Amil Amen of [facility].  The patient had his ICD      interrogated which came back fine.  The patient's amiodarone level      was decreased down to 200 mg daily.  The patient was placed on IV      heparin as he had a history of known mural thrombus and multiple      histories of embolic strokes.  His Coumadin was  restarted and he      was monitored until his INR levels were therapeutic between 2-3.      The patient improved symptomatically during the hospitalization.      His Crestor was increased to 30 mg daily and the patient was      followed by cardiology throughout the hospitalization.  The patient      is to follow up with cardiology as an outpatient and the patient      was discharged in a stable and improved condition.  2. Subtherapeutic INR.  The patient with known history of      noncompliance to Coumadin.  The patient was on Coumadin for mitral      valve replacement.  Also has a history of left atrial thrombus with      multiple embolic strokes.  The patient was initially placed on      heparin IV as it was felt his symptoms that he was presenting with      were likely secondary to subtherapeutic INR including a right-sided      TIA.  The patient's Coumadin was restarted.  The patient was kept      in the hospital until his INR was within goal.  The patient was      discharged home on Coumadin and will follow up with Coumadin clinic      with his cardiologist.  The patient was discharged in stable and      improved condition.  3. Right TIA.  On admission the patient was noted to have symptoms of      a TIA which resolved quickly on presentation in the ED.  The  patient remained asymptomatic throughout the hospitalization.      Repeat carotid Dopplers were obtained which were negative.  The      patient had a CT done which was negative and had a repeat CT within      48 hours as the patient could not have an MRI secondary to his      pacemaker.  The patient had CTs that came back negative with      results as stated above.  Neurology was consulted.  It was felt      that the patient had suffered from a TIA due to his history of left      atrial thrombus, subtherapeutic INR and history of multiple embolic      events both cerebrovascular as well.  The patient remained       asymptomatic.  An EEG was not done as one was recently done.  The      patient will likely get a repeat EEG as an outpatient with his      cardiologist.  Risk factor modification was stressed to the patient      and the patient was discharged in stable and improved condition.  4. Atrial fibrillation with RVR.  During the hospitalization the      patient's amiodarone was decreased to 200 mg daily per his      cardiologist.  The patient was started on heparin during the      hospitalization as he was noted he had a subtherapeutic INR with a      history of a left atrial thrombus.  The patient's Coumadin was      restarted.  The patient was kept in-house until his INR was within      goal of 2-3 with a further followup as outpatient with his      cardiologist.   The rest of the patient's chronic medical issues remained stable  throughout the hospitalization.   DISCHARGE LABS:  BNP of 73.  A PT of 33.3, INR of 3.0, sodium of 140,  potassium 4.1, chloride 104, bicarb 27, glucose 102, BUN 17, creatinine  1.29, calcium of 9.22.  CBC - white count 8.5, hemoglobin 12.8,  hematocrit 39.8, platelets of 208.   It was a pleasure taking care of Mr. Jamicheal Heard.      Ramiro Harvest, MD  Electronically Signed     DT/MEDQ  D:  04/26/2008  T:  04/26/2008  Job:  [id number]   cc:   Lavonda Jumbo, M.D.  Francisca December, M.D.  Pramod P. Pearlean Brownie, MD

## 2010-10-24 NOTE — Discharge Summary (Signed)
NAME:  [name], [name] NO.:  [id number]   MEDICAL RECORD NO.:  [id number]                   PATIENT TYPE:  INP   LOCATION:  2011                                 FACILITY:  [facility]   PHYSICIAN:  Darden Palmer., M.D.         DATE OF BIRTH:  Oct 29, 1945   DATE OF ADMISSION:  01/13/2004  DATE OF DISCHARGE:  01/18/2004                                 DISCHARGE SUMMARY   FINAL DIAGNOSES:  1. Abdominal pain, resolved.  2. Chronic Coumadin anticoagulation with subtherapeutic Coumadin due to     noncompliance.  3. Atrial flutter, converted.  4. Cardiomyopathy, combined ischemic and idiopathic.  5. Bjork-Shiley mitral valve.  6. History of multiple arterial emboli previously.  7. Coronary artery disease with previous stent to the circumflex and a     previous occlusion of the right coronary artery.  8. History of hiatal hernia.  9. History of multiple strokes.   HISTORY:  A 65 year old male who has had several previous admissions for  subtherapeutic anticoagulation.  He has had three previous strokes.  In  addition, he has had arterial emboli to his extremity as well as his  mesenteric arteries.  He had failed to return for a pro time at the office  since June despite multiple letters and had been traveling.  He presented to  the emergency room with significant abdominal pain and was found to have  atrial flutter and fibrillation with rapid ventricular response.  He was  admitted to evaluate this.  Please see the previously dictated history and  physical for remainder of the details.   HOSPITAL COURSE:  White count 17,200 on admission and fell to 9700.  Hemoglobin was 15.2.  Pro time was 13.2 with an INR of 1.  PTT was 33.  Chemistry panel shows a sodium of 139, potassium of 4.8, chloride of 108,  glucose of 129.  Liver enzymes were all normal.  B-natriuretic peptide was  1000.  MB was 5.4.  Troponin was 0.45 and 0.49.  TSH was 1.783.  Chest x-ray  shows pleural thickening at the bases and pleural calcifications.  He was  also found to have gallstones with one small gallstone noted.  Bladder and  pelvis were okay.  Chest x-ray shows mild cardiomegaly.  EKG shows atrial  flutter on admission with rapid ventricular response.   The patient was brought into the hospital and begun on Diltiazem.  He was  heparinized and later changed to Lovenox.  He was placed back on the  treatment with Coumadin.  After increasing his amiodarone and his Diltiazem,  he converted back to sinus rhythm.  All of his medicines were restarted.  Amiodarone levels were drawn and were less than 0.3 which suggested he has  not been taking his medicines.   He was continued on Lovenox and it was not felt safe to discharge him home  due to his noncompliance and so we  kept him in the hospital to load his  medications.  He eventually developed a Coumadin INR of 2.3 and his  abdominal pain had resolved when he went back to sinus rhythm.  He was  discharged in improved condition.  He is currently going to be on Lasix 40  mg daily, Coreg 6.25 b.i.d., amiodarone 200 mg b.i.d., quinapril 10 mg  daily, aspirin 81 mg daily, Vytorin 10/40 mg daily, and aspirin 81 mg daily.  Extensive discussion was had with the patient  regarding compliance issues.  He was also seen by the case manager who noted  the above issues and noted consumption of beer on the weekends.  He was  discharged to have a home health RN draw his Coumadin at home.  He also is  to report to the office in one week for follow-up.                                                Darden Palmer., M.D.    WST/MEDQ  D:  01/18/2004  T:  01/18/2004  Job:  [id number]

## 2010-10-24 NOTE — Discharge Summary (Signed)
   NAME:  [name], [name] NO.:  [id number]   MEDICAL RECORD NO.:  [id number]                   PATIENT TYPE:  INP   LOCATION:  2036                                 FACILITY:  [facility]   PHYSICIAN:  Doylene Canning. Ladona Ridgel, M.D.               DATE OF BIRTH:  August 03, 1945   DATE OF ADMISSION:  01/23/2003  DATE OF DISCHARGE:  01/26/2003                                 DISCHARGE SUMMARY   PRIMARY DIAGNOSIS:  Atrial flutter.   HISTORY OF PRESENT ILLNESS:  This is a 65 year old gentleman with a history  of recurrent atrial flutter, history of mitral valve disease status post  mitral valve replacement, who was initially found to have left atrial  thrombus by transesophageal echocardiogram. Subsequently has been  therapeutically anticoagulated who returns for atrial flutter ablation. The  patient underwent an atrial flutter ablation. He tolerated the procedure  well. Had no immediate postoperative complications. He was kept in the  hospital on heparin until his INR was 2.6. When his INR was therapeutic, the  patient was discharged to home on all of his previous medications.   DISCHARGE MEDICATIONS:  1. Amiodarone 200 mg daily.  2. Coreg 6.25 b.i.d.  3. Coumadin 5 mg nightly,  4. Zocor 20 nightly.  5. Accupril 10 daily.  6. Lasix 40 mg daily.  7. Tylenol one to two tablets every four to six hours as needed.   ACTIVITY:  No heavy lifting or strenuous activity for two days. No driving  for one more day.   DIET:  Low fat, low cholesterol diet.   FOLLOW UP:  He was to call if he developed a lump or any drainage in his  groin or neck. A followup appointment was  scheduled with Dr. Ladona Ridgel  September 22 at 9:15 a.m.      Chinita Pester, C.R.N.P. [facility]                 Doylene Canning. Ladona Ridgel, M.D.    DS/MEDQ  D:  01/26/2003  T:  01/26/2003  Job:  [id number]   cc:   W. Ashley Royalty., M.D.  [address], [address]  [place]  [place] [address]  Fax: [phone]   Kathrine Cords,  R.N. [facility]

## 2010-10-24 NOTE — Discharge Summary (Signed)
NAME:  [name], [name] NO.:  [id number]   MEDICAL RECORD NO.:  [id number]                   PATIENT TYPE:  INP   LOCATION:  2036                                 FACILITY:  [facility]   PHYSICIAN:  Maple Mirza, P.A.              DATE OF BIRTH:  May 09, 1946   DATE OF ADMISSION:  07/04/2002  DATE OF DISCHARGE:  07/15/2002                                 DISCHARGE SUMMARY   DISCHARGE DIAGNOSES:  1. 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  2. Also in recurrence, rapid atrial flutter with subtherapeutic Coumadin,     anticoagulation values.  3. Persistent atrial fibrillation/ flutter this hospitalization resistant to     amiodarone.  Maintained on intravenous heparin and Coumadin.   SECONDARY DIAGNOSIS:  1. History of rheumatic heart disease, placement of Bjork-Shiley mitral     valve, at age 55, in 69, noncompliance with necessary Coumadin dosing     postoperatively.  2. History of atherosclerotic coronary artery disease, status post stenting     of the left circumflex in May of 2002.  Also found to have the right     coronary artery completely occluded.  The left anterior descending has     minimal disease.  There is cardiomyopathy with an ejection fraction of     25%.  3. History of recurrent atrial flutter.  4. History of mesenteric embolus status post laparotomy within the past     year.  5. 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  6. Hyperlipidemia.  7. Chronic, long-term history of tobacco habituation.   PROCEDURES THIS HOSPITALIZATION:  1. July 04, 2002, venous Doppler ultrasound showing clot in the right     popliteal artery.  2. July 05, 2002, aortogram with bilateral runoff, Dr. Quita Skye. Hart Rochester,     showing right popliteal artery occluded at the knee.  3. July 06, 2002, right popliteal to anterior tibial thromboembolectomy     with placement of a right  popliteal to posterior tibial bypass graft     using reverse greater saphenous vein graft.  Dr. Quita Skye. Hart Rochester,     Careers adviser.  The patient was placed on IV heparin postoperatively.  The     graft occluded postoperative day one.  4. July 07, 2002.  Right popliteal to peroneal bypass graft using reverse     saphenous vein graft with thrombectomy of the right popliteal to     posterior tibial bypass and reimplantation of the vein graft into the     peroneal artery yielding a popliteal to peroneal bypass.  Restarted on     heparin and Coumadin  postoperatively.  5. July 10, 2002.  Transesophageal echocardiogram prior to ablation     procedure showing thrombus in the left atrium.  The patient will have to     wait four weeks on  Coumadin therapy prior to ablation.   DISPOSITION:  The patient was discharged from [facility] on  July 15, 2002, postoperative day number eight, after placement of a  bypass in the right lower extremity.  The bypass after the second operation  remained patent.  His leg pain had decreased.  His swelling in the right leg  had decreased.  The staples had been removed prior to his discharge.  His  mental status had been clear postoperatively.  Pain control with oral  analgesics.  His incisions were healing nicely.  He remained in atrial  flutter on rate control with amiodarone.  His Coumadin was therapeutic with  an INR of 3.1 on discharge.   DISCHARGE MEDICATIONS:  The patient goes home on the following medications:  1. Coumadin 7.5 mg daily.  2. Coreg 6.25 mg b.i.d.  3. Amiodarone 200 mg daily.  4. Lasix 40 mg daily.  5. Accupril 10 mg daily.  6. Zocor 20 mg preferably at bedtime.  7. Tylox one to two tables every 4-6 hours as needed for pain.   DISCHARGE ACTIVITIES:  1. No driving or heavy lifting until he sees Dr. Hart Rochester.  2. He has to use a rolling walker to help ambulation.   DISCHARGE DIET:  Low-sodium, low-cholesterol diet.   DISCHARGE  INSTRUCTIONS:  1. The patient may shower.  2. He is to report any drainage from his incision to Dr. Candie Chroman office.  3. He will see Dr. Hart Rochester in three weeks.  4. Dr. Candie Chroman office will call to schedule that appointment.  5. He is also to present to Dr. York Spaniel office for Prothrombin time/ INR     followup.  6. He will also have planned ablation four weeks after discharge if his     Coumadin levels are documented to be therapeutic.   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  At that time, he was  found to be in rapid atrial flutter, and transferred to [facility] emergency room.  He denied shortness of breath or significant chest  pain.  He is vague about when he took his last dose of Coumadin.  In fact,  serum Coumadin levels are subtherapeutic consistent with normal levels in  non-dosing individuals.  The patient has a prior history of rheumatic heart  disease, a Bjork-Shiley mitral valve was placed in 1982.  For this, he would  need long-term Coumadin therapy.  He has a history of noncompliance with  this.  He had been found to have several cerebral emboli due to his  noncompliance.   In May 2002, he presented with unstable angina and was found to have a  severe stenosis of the circumflex, and received stenting at that time.  It  was also found that the right coronary artery was totally occluded.  He also  was found to have cardiomyopathy with an ejection fraction of 25%.  He has  had recurrent admissions for recurrence of atrial flutter, and was placed on  amiodarone.  He was most recently admitted March 30, 2002, with  subtherapeutic anticoagulation and recurrent atrial flutter.  During that  admission, he was seen by Dr. Duke Salvia who thought he would be a candidate for ablation.  The patient  did not follow up with Dr. Graciela Husbands  afterwards.  He  has also had a previous mesenteric embolus, and has had  laparotomy for that within the past year.  He will be placed on IV heparin,  venous Dopplers will be taken as well as a transesophageal echocardiogram.   HOSPITAL COURSE:  Described basically in discharge.   DISPOSITION:  After admission to [facility] with right leg pain and  swelling on July 04, 2002, it was found by venous Doppler that he had a  clot in the right popliteal artery.  He was seen in consultation by Dr. Balinda Quails, cardiovascular thoracic surgery at [facility], the same day,  January 27.  He recommend an arteriogram.  This was done by Dr. Hart Rochester on  January 28  The study did indeed show right popliteal artery occlusion at  the knee, and surgery was planned for the next day, January 29.  A right  popliteal to the posterior tibial bypass was placed with reverse saphenous  vein graft by Dr. Hart Rochester.  A planned transesophageal echocardiogram had been  held in abeyance secondary to the surgical issue.  It is planned before his  discharge.  It was also found that his right graft placed January 29 had occluded on  postoperative day number one.  On January 30, he was retaken to the  operating room for a right popliteal to peroneal bypass using a reverse  saphenous vein from the original bypass reimplanted into the peroneal  artery.  This graft remained patent postoperative, and the patient was  started on heparin and Coumadin, and underwent transesophageal  echocardiogram as mentioned above, on July 10, 2002.  This study showed  thrombus in the left atrium and ablation plans were postponed until four  weeks of anticoagulation could be accomplished.  The patient goes home with  the Coumadin and his other medications as dictated above.                                               Maple Mirza, P.A.    GM/MEDQ  D:  10/04/2002  T:  10/05/2002  Job:   [id number]   cc:   Darden Palmer., M.D.  [address]  [place]  [place] [address]  Fax: [phone]   Quita Skye. Hart Rochester, M.D.  [address]  [place]  [place] [address]  Fax: [phone]
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
## 2010-10-24 NOTE — Op Note (Signed)
NAMEBRANDY, [name] NO.:  [id number]   MEDICAL RECORD NO.:  [id number]          PATIENT TYPE:  INP   LOCATION:  2303                         FACILITY:  [facility]   PHYSICIAN:  Salvatore Decent. 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   PROCEDURE:  Redo median sternotomy, redo mitral valve replacement with  27 mm pericardial valve, resection of left atrial appendage, placement  of the epicardial ventricular pacing leads and modified Cox Maze III.   SURGEON:  Salvatore Decent. Dorris Fetch, M.D.   ASSISTANT:  Constance Holster, PA   ANESTHESIA:  General.   FINDINGS:  Transesophageal echo cardiography revealed partial  obstruction of the [facility] mitral prosthesis with impaired opening  and closing of the tilting disk, impaired left ventricular function,  marked atrial enlargement.  Post bypass revealed no change in  ventricular function, good function of the pericardial prosthetic mitral  valve with expected small central jet, no perivalvular leaks.   INTRAOPERATIVE FINDINGS:  Extensive pannus formation on the prosthetic  valve with acute and subacute thrombus on the valve, extensive  intrapericardial adhesions except for the lateral wall.   CLINICAL NOTE:  [name] is a 65 year old gentleman who had undergone  [name] mitral valve replacement 25 years ago.  During the interim,  he had had difficulty with managing his Coumadin and had trouble with  compliance.  He had had multiple embolic events including multiple  strokes.  The patient was admitted with a transient neurologic event  which resolved.   PAST  SURGERIES:  His evaluation included echocardiogram.  On  transesophageal echocardiogram he had evidence of partial thrombosis of  his [name] prosthetic valve.  The patient was advised to undergo  redo surgery for replacement of the prosthetic valve and it was  recommended to him that a pericardial valve be used, given his multiple  complications with previous mechanical replacement.  He was also advised  that epicardial pacemaker leads would be placed for possible conversion  to bi-V pacer and also if possible, a Maze procedure would be performed  to decrease his risk for further atrial fibrillation and atrial flutter.  The indications, risks, benefits and alternatives were discussed in  detail with the patient and he understood the high-risk nature of the  procedure and agreed to proceed.   OPERATIVE NOTE:  [name] was brought to the preop holding area on  June 17, 2006.  There he was prepared by anesthesia service by  placing a Swan-Ganz catheter and arterial blood pressure line.  He was  taken to the operating room, anesthetized and intubated.  A Foley  catheter placement was attempted but the patient had a stricture.  Dr.  Ihor Gully of the urology service dilated the stricture and placed a  Foley catheter.  The  chest, abdomen and legs then were prepped and  draped in usual fashion.  Transesophageal echocardiography was  performed.  It showed partial thrombosis with impaired opening and  closing of the tilting disk valve with a significant transvalvular  gradient.  There was overall global left ventricular hypokinesis.  A  redo median sternotomy was performed.  The skin incision was performed  to the prior incision and was carried through the skin and subcutaneous  tissue.  The wires were removed.  The sternum was divided with an  oscillating saw.  Each half of the sternum was elevated and the  underlying tissue was dissected off to allow placement of the retractor.   The patient was fully heparinized.  Dissection was begun in the  pericardium along the right atrium and up to the aorta.  There were two  large masses of pledgets on the aorta at previous cannulation sites.  The Teflon felt was debrided down to allow cannulation through the  previous sites.  After confirming adequate anticoagulation with ACT  measurement, the aorta was cannulated via concentric 2-0 Ethibond  pledgeted pursestring sutures.  The right atrial dissection was carried  out further.  The superior vena cava was dissected out and a 31-French  right-angle metal tip cannula was placed via pursestring suture in the  superior vena cava.  Partial cardiopulmonary bypass was instituted and a  second cannula was placed in the right atrium and directed to the  inferior vena cava.  This was a 40-French plastic tip right angle  cannula.  Soft tapes were placed around the cavae for later tightening  during the open portion of the procedure.   The remainder of the dissections were taken down.  These were  particularly severe in the area of the apex, but laterally there was a  relative free space and the left atrial appendage was freely mobile.  It  was divided at its base with two firings of an Echelon stapler and the  specimen was sent for pathology.  Two epicardial pacemaker leads were  then placed on the lateral wall of the heart and the leads were then  tested.  The first lead was Medtronic model 5071, 53 cm in length.  It  was the serial number [id number]. initially had R waves 17.3 mV,  threshold 2.6 volts. On repeat testing it had an R wave 21.4 mV and  impedance of 821 ohms, threshold 2 volts at 50 milliseconds and a  current at 2.8 mA.  A second lead then was placed.  This was the same  model, serial number [id number] with an  R wave 17.3 mV, impedance of  690 ohms, threshold 1.4 volts, 50 milliseconds and current 2.4 mA. These leads then were brought through a window in the  pericardium and  retracted out of place.  Later at the end of the procedure they were  tunneled into a subcutaneous location adjacent to the previous pacemaker  pocket.  The pacemaker pocket itself was not violated.   A retrograde cardioplegia cannula placed via pursestring suture in the  right atrium.  Antegrade cardioplegic cannula placed in the ascending  aorta.  The aorta was crossclamped, left ventricle was emptied via  aortic root vent.  Cardiac arrest was achieved combination of cold  antegrade and retrograde blood cardioplegia and topical iced saline.  The patient had marked cardiomegaly and a large volume of cardioplegia  was necessary to achieve initial diastolic arrest and adequate cooling,  additional cardioplegia was  administered at 20 to 25-minute intervals  throughout the remainder of the procedure to maintain adequate  myocardial cooling, the patient did achieve a rapid diastolic arrest and  that was maintained throughout the crossclamp period.   Dissection down to the left atrium revealed extensive scarring in the  area of the previous left atriotomy.  Incision was made through the  right atrium and then through the interatrial septum.  From this  approach the valve was almost in a parallel orientation and  visualization was very difficult.  This did allow, however, delineation  of the left atrial pulmonary vein junction and a second incision was  made into the left atrium.  The retractor was placed and there was good  visualization, still made difficult due to the orientation of the valve  and the previous surgery.  There was marked pannus over the [facility]  valve.  There was also thrombus on the disk and in the strut.  The  pannus was incised and the valve was separated and removed from the  sewing ring.  The valve was sent as a specimen.  There was still the  Dacron cuff along with a plastic ring within the cuff.  This was removed  carefully.  After removal of  this there was an area particularly in the  area from approximately 6 o'clock to 9 o'clock on the annulus where  there was separation of the atrium and ventricle. This area was  reconstructed with the pledgeted valvular sutures.  The annulus sized  for a 27  mm Carpentier Edwards pericardial mitral valve.  2-0 Ethibond  annular sutures then were placed circumferentially in the area where  there was some atrial ventricular separation, the sutures were passed  through pledgets on the ventricular side and then additional pledgets  were placed on the atrial side before passing through the sewing ring of  the valve.  The valve was lowered into place and seated nicely.  The  sutures were tied sequentially.  Of note, there was very little tissue  anteriorly.  After tying the valve into place, the annulus was gently  probed circumferentially with a fine tip right angle.  No gaps were  noted.  A modified Cox III Maze procedure then was performed using monopolar  irrigated radiofrequency ablation.  The pulmonary veins were isolated  and then the pulmonary vein lesions were connected.  Of note those burns  were on the left atrial tissue not the pulmonary vein tissue itself.  A  connecting line then was taken to the base of the left atrial appendage  which was also ablated and then a second connecting line was taken to  the mitral annulus at 4 o'clock position.  The Maze was difficult due to  the very thick and floppy nature of the left atrial wall.   A Foley catheter then was placed across the mitral valve and the left  atriotomy was closed in two layers with a running 4-0 Prolene horizontal  mattress suture followed by running 4-0 Prolene simple suture.  Before  tying the sutures, the balloon on the Foley catheter was fully deflated.  De-airing was performed and the Foley catheter was removed.   The right sided Maze lesions then were performed again using monopolar  radiofrequency ablation.  The  central right atrial incision was used as  a starting point for lesions to the right atrial, superior vena caval  and right atrial, inferior vena caval junctions and another ablation  line was  performed to the right atrial appendage and then from there to  the tricuspid annulus and finally the isthmus lesion was created.  The  transseptal incision then was closed in two layers with the left atrial  side being closed first with a running 4-0 Prolene suture and then the  right atrial side was closed with a running 4-0 Prolene suture.  The  remaining right atrial incision also was closed with running 4-0 Prolene  suture.  The patient was placed in Trendelenburg position.  Lidocaine  was administered.  The patient was placed in Trendelenburg position and  final de-airing was performed.  The aortic crossclamp was removed.  Total crossclamp time was 175 minutes.   The patient was being rewarmed, the atriotomies were inspected for  hemostasis.  The retrograde cardioplegia cannula was removed.  The  superior vena caval cannula was redirected into the right atrium.  The  inferior vena cava cannula was removed.  The patient spontaneously  resumed a paced rhythm.  A dopamine infusion was initiated.  A trial  wean from cardiopulmonary bypass was performed which the patient  tolerated well and it revealed no change in left ventricular function.  There was a central jet of AI typical for pericardial prosthetic valves.  There was no significant perivalvular leak.  The patient was placed back  on full support and final preparation for weaning from bypass were  performed.  Temporary epicardial pacing wires were placed on the right  ventricle and right atrium to allow for the easily managed external  pacing.  DDD pacing was initiated at 90 beats per minute.  The patient  weaned from cardiopulmonary bypass without difficulty.  Again postbypass transesophageal echocardiography revealed good prosthetic valve  function  with no significant perivalvular leaks.  The initial cardiac index was  greater than 2 liters per minute per meter squared.   A test dose of protamine was administered, was well tolerated.  The  atrial and aortic cannulae were removed.  The patient was  thrombocytopenic.  The remainder of protamine was administered and  platelet infusion was initiated.  The sternal retractor was removed.  About this time the patient developed hypotension.  It was unclear if  this might be related to the delayed protamine reaction or the platelet  infusion.  The patient briefly had ventricular tachycardia but converted  without requiring defibrillation.  Epinephrine was administered and  steroids were administered.  The patient then remained hemodynamically  stable thereafter and tolerated the remainder of platelet infusion  without significant issues.  Hemostasis was achieved.  The chest was  copiously irrigated with warm saline solution containing vancomycin and  a pericardial Gore-Tex patch was placed over the heart to protect from  adhesions from the sternum, two mediastinal chest tubes were placed  through separate subcostal incisions.  The chest was closed  with single and double heavy gauge stainless steel wires.  The remaining  incision was closed standard fashion.  All sponge, needle and instrument  counts were correct at end of procedure.  The patient was transported  from the operating room to the surgical intensive care unit in critical  condition.           ______________________________  Salvatore Decent Dorris Fetch, M.D.     SCH/MEDQ  D:  06/17/2006  T:  06/18/2006  Job:  [id number]

## 2010-10-24 NOTE — Consult Note (Signed)
[place]  Patient:    [name], [name] Visit Number: [id number] MRN: [id number]          Service Type: MED Location: [id number] Attending Physician:  McDiarmid, Leighton Roach. Dictated by:   Darden Palmer., M.D. Proc. 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  He is a difficult male who had a 29 mm Bjork-Shiley prosthesis placed in his mitral valve on December 25, 1980, for mitral stenosis which was felt to be severe.  He had no cardiac follow-up following his valve surgery for unclear reasons.  He would take Coumadin only sporadically.  He had an acute cerebrovascular accident in February of 1990 due to nontherapeutic anticoagulation and had another stroke in 1995 with subtherapeutic Coumadin anticoagulation and some behavioral changes at that time.  He finally had a transesophageal echocardiogram in September of 1995. It was recommended that he have adequate anticoagulation following that.  He was hospitalized in November of 1996 with a painful sore leg, being unable to ambulate, and was found to be in rapid atrial fibrillation.  He underwent cardioversion later in November.  Ankle brachial indices were okay at that admission.  He has been hospitalized for full mouth extractions in the past and has had intractable hiccups.  He has had recurrent afebrile over the years and also was found to have poor ventricular function in 2000.  We started him on amiodarone, but he signed out against advice during that admission.  There have been difficulties with noncompliance over the years and he has had previous strokes noted.  He has continued to smoke heavily.  His  Coumadin anticoagulation has been very difficult to manage.  He was admitted with severe chest pain in October of this year and underwent catheterization with findings of severe circumflex disease and had two stents placed within the circumflex.  The right coronary artery was occluded and the left anterior descending had minimal disease.  The LV ejection fraction was 20-25%.  During that admission, he was placed on ACE inhibitors.  He was placed on amiodarone in an attempt to get him back to sinus rhythm.  He was begun on lipid-lowering therapy and also some Plavix.  He was later placed back on Coumadin.  He was last seen on April 21, 2001, and failed appointments on May 19, 2001, and May 18, 2001.  He presented to the [facility] Emergency Room with abdominal pain and was found to have developed an acute abdomen. Later he was found to have infarcted bowel for which he underwent operation last night.  He was found to be markedly overanticoagulated with a pro time INR of greater than 11.  The patient is currently lethargic.  It is hard to get any kind of a good history out of him, but I would say that he has had a significant history of medical noncompliance and difficulty with following up his Coumadin with marked fluctuations in his pro time despite repeated efforts to get him to come in and repeated phone calls.  PAST MEDICAL HISTORY: 1. Cardiomyopathy. 2. Previous mitral valve disease. 3. Some  sort of previous psychiatric disorder, possibly related to his    previous strokes. 4. Previous strokes. 5. Previous atrial fibrillation.  PAST SURGICAL HISTORY:  Mitral valve replacement in 1982.  SOCIAL HISTORY:  His wife left him several years ago.  He drinks occasional alcohol.  He stated that he had quit smoking at the time of his stenting, but has gone back to smoking.  He works at [employer] as a Engineer, petroleum.  He does not follow specific diet or get regular  exercise.  FAMILY HISTORY:  Recorded in previous records from October.  REVIEW OF SYSTEMS:  Unobtainable now.  PHYSICAL EXAMINATION:  He is a middle-aged male who is pale with an NG tube in place and complaining of abdominal pain.  VITAL SIGNS:  The pulse is currently 120 and irregular.  SKIN:  Warm and dry.  NECK:  No JVD, thyromegaly, or bruits.  LUNGS:  Clear to A&P.  CARDIAC:  There is a mechanical valve sound heard with an irregular rhythm. No definite murmur is noted.  ABDOMEN:  Slightly tender with previous surgical scars.  EXTREMITIES:  There is no edema noted.  NEUROLOGIC:  Grossly intact.  LABORATORY DATA:  The 12-lead ECG shows atrial fibrillation and nonspecific changes.  IMPRESSION: 1. 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 2. Coronary artery disease with previous stenting of circumflex in October. 3. Prosthetic Bjork-Shiley mitral valve. 4. Atrial fibrillation. 5. Decreased left ventricular function. 6. History of alcohol abuse. 7. Cigarette abuse. 8. History of strokes due to suboptimal Coumadin anticoagulation.  RECOMMENDATIONS:  At the present time, he is NPO and he will need to be begun on heparin since the Bjork-Shiley valve is highly thrombogenic.  He will need to have Coumadin restarted with an INR between 3-3.5 because of his prior embolus.  I have found him to be challenging and difficult to get compliance over the years.  We will follow him along and try to help with postoperative management.  He needs to restart his ACE inhibitor and we may try to get him back on beta blockers also once he is over surgery. Dictated by:   Darden Palmer., M.D. Attending Physician:  McDiarmid, Tawanna Cooler D. DD:  06/24/01 TD:  06/27/01 Job: [id number]

## 2010-10-24 NOTE — H&P (Signed)
NAME:  [name], [name] NO.:  [id number]   MEDICAL RECORD NO.:  [id number]          PATIENT TYPE:  INP   LOCATION:  0101                         FACILITY:  [facility]   PHYSICIAN:  Ulyses Amor, MD DATE OF BIRTH:  1945/06/29   DATE OF ADMISSION:  11/17/2005  DATE OF DISCHARGE:                                HISTORY & PHYSICAL   HISTORY OF PRESENT ILLNESS:  [name] is a 65 year old white man who is  admitted to [facility] for an exacerbation of congestive heart  failure as well as recurrent atrial fibrillation.   The patient has a long history of cardiac disease.  He has a nonischemic  cardiomyopathy.  His ejection fraction was mostly recently estimated to be  in the range of 25%.  He has coronary artery disease, having undergone  stenting of the circumflex.  He has also undergone mitral valve replacement.  He has been admitted on multiple occasions for recurrent atrial fibrillation  and atrial flutter over the years, and he has undergone repeated  cardioversions.  He was most recently cardioverted on Oct 09, 2005.  He was  in Dr. York Spaniel office yesterday, where it was noted that his atrial  fibrillation had recurred.  Oral diltiazem was started.  The thinking had  been that if he continued to experience recurrent atrial fibrillation,  consideration would be given to AV nodal ablation and placement of a  biventricular ICD.  He currently has a pacemaker.   The patient presented to the emergency department with a 1-day history of  progressive dyspnea.  This included orthopnea, dyspnea at rest, as well as  mild peripheral edema.  This progressed to the point where he became ill and  he was transported to the emergency department.  He was given intravenous  furosemide and BiPAP.  With diuresis, his dyspnea has markedly improved and  BiPAP has been discontinued in favor of a nasal cannula.  The patient  reports that his symptoms have markedly improved  since presentation to the  emergency department.   The patient has been taking his medications as prescribed.  He has not  experienced any chest pain, tightness, dizziness, heaviness, or squeezing.  He has noted a rapid and irregular heart beat in the last several days, but  he has noted no dizziness, lightheadedness, syncope, or near-syncope.   The patient has a number of risk factors for coronary artery disease  including hypertension, dyslipidemia, and smoking  There is no history of  diabetes mellitus.  There is a family history of coronary artery disease  (father).   The patient has a history of peripheral vascular disease.   MEDICATIONS:  Coumadin, amiodarone, Coreg, furosemide, quinapril, and  Crestor.   ALLERGIES:  None.   SOCIAL HISTORY:  The patient lives alone.  He is a retired Curator.  He is  divorced.  He has 1 daughter and 1 son.  He continues to drink alcohol and  has a past history of alcohol abuse.  He no longer smokes, as described  above.   FAMILY HISTORY:  There is a family  history of coronary artery disease  (father).  His mother died of a stroke.  There is no other significant  family history.   REVIEW OF SYSTEMS:  Review of systems reveals no new problems related to his  head, eyes, ears, nose, mouth, throat, lungs, gastrointestinal system,  genitourinary system, or extremities.  There is no history of neurologic or  psychiatric disorder.  There is no history of fever, chills or weight loss.   PHYSICAL EXAMINATION:  VITAL SIGNS:  Blood pressure 114/73.  Pulse 110 and  irregularly irregular.  Respirations 22.  Temperature 98.9.  Pulse oximetry  98% on 4 L.  GENERAL:  The patient was a middle-aged white man in slight respiratory  discomfort.  He was alert, oriented, appropriate, and responsive.  HEENT:  Head, eyes, nose, and mouth were normal.  NECK:  The neck was without thyromegaly or adenopathy.  Carotid pulses were  palpable bilaterally and without  bruits.  CARDIAC:  Examination revealed an irregularly irregular rhythm.  There was  no gallop, murmur, rub, or click.  Prosthetic mitral valve sounds appeared  crisp.  No chest wall tenderness was noted.  LUNGS:  Examination of the lungs revealed negligible basilar rales  (performed after the patient had responded to diuresis).  ABDOMEN:  The abdomen was soft and nontender.  There was no mass,  hepatosplenomegaly, bruit, distention, rebound, guarding, or rigidity.  Bowel sounds were normal.  RECTAL AND GENITAL:  Examinations were not performed as they were not  pertinent to the reason for acute care hospitalization.  EXTREMITIES:  The extremities were without edema, deviation, or deformity.  Radial and dorsalis pedal pulses were palpable bilaterally.  NEUROLOGIC:  Brief screening neurologic survey was unremarkable.   LABORATORY AND ACCESSORY CLINICAL DATA:  The electrocardiogram revealed  atrial flutter with variable AV conduction with ventricular rate of 114  beats per minute.  Nonspecific ST-T changes were noted.  A nonspecific  intraventricular conduction delay was noted.   The chest x-ray radiograph, according to the radiologist, demonstrated  congestive heart failure.   INR was 2.1.  BNP was 455.  Potassium was 4.1, BUN 15, and creatinine 1.5.  The initial set of cardiac markers revealed a myoglobin of 61.1, CK-MB 2.4,  and troponin less than 0.05.  The urinalysis was notable for 11-20 red blood  cells, though this was performed after attempted Foley catheter revision.  White count was 11.7 with a hemoglobin of 13.3 and hematocrit of 40.5.   IMPRESSION:  1.  Congestive heart failure exacerbation.  Ejection fraction most recently      estimated to be in the range of 25%.  2.  Recurrent atrial fibrillation and atrial flutter.  Status post multiple      cardioversions, most recently on Oct 09, 2005.  3.  Status post mitral valve replacement. 4.  Coronary artery disease; status  post circumflex stent.  5.  Permanent pacemaker.  6.  Peripheral arterial disease.  7.  Hypertension.  8.  Dyslipidemia.  9.  History of alcohol abuse.   PLAN:  1.  Telemetry or stepdown.  2.  Serial cardiac enzymes.  3.  No heparin, as INR is 2.1.  4.  Diuresis.  5.  Daily weight, inputs and outputs, oxygen.  6.  Intravenous diltiazem for ventricular rate control.  7.  Further measures per Dr. Donnie Aho.      Ulyses Amor, MD  Electronically Signed     MSC/MEDQ  D:  11/17/2005  T:  11/17/2005  Job:  [id number]   cc:   Ulyses Amor, MD  Fax: [phone]

## 2010-10-24 NOTE — Consult Note (Signed)
[place]  Patient:    [name], [name] Visit Number: [id number] MRN: [id number]          Service Type: MED Location: [phone] Attending Physician:  McDiarmid, Leighton Roach. Dictated by:   Lindaann Slough, M.D. Proc. Date: 07/01/01 Admit Date:  06/22/2001   CC:         Mena Pauls, M.D.   Consultation Report  REASON FOR CONSULTATION:  Hematuria.  HISTORY OF PRESENT ILLNESS:  The patient is a 65 year old male who was found on urinalysis to have hematuria.  The patient denies any history of gross hematuria.  He has no frequency, hesitancy, dysuria, or straining on urination.  He has no history of kidney stone.  He is status post small-bowel resection for infarcted small bowel.  MEDICATIONS:  He is currently on heparin and Coumadin.  PHYSICAL EXAMINATION:  ABDOMEN:  He has no CVA tenderness.  His kidneys are not palpable.  His bladder is not distended.  GU:  Penis is uncircumcised.  Meatus is normal.  Scrotum is unremarkable. Testicles, cords, and epididymes are within normal limits.  RECTAL:  Sphincter tone is normal.  prostate is enlarged 2+, no nodules. Seminal vesicles are not palpable.  LABORATORY DATA:   Urinalysis shows 3 to 6 rbcs and 0 to 2 wbcs.  His BUN is 12, creatinine 0.8.  CT scan of the abdomen and pelvis on June 22, 2001, showed a tiny cyst in the left kidney.  Urine culture on June 27, 2001, showed no growth.  IMPRESSION:  Hematuria.  SUGGESTION:  Cystoscopy when patient is stable to rule out bladder tumor. Dictated by:   Lindaann Slough, M.D. Attending Physician:  McDiarmid, Tawanna Cooler D. DD:  07/01/01 TD:  07/02/01 Job: [id number]

## 2010-10-24 NOTE — Op Note (Signed)
NAMEJMARI, PELC NO.:  [id number]   MEDICAL RECORD NO.:  [id number]          PATIENT TYPE:  INP   LOCATION:  2025                         FACILITY:  [facility]   PHYSICIAN:  Duke Salvia, M.D.  DATE OF BIRTH:  08/05/1945   DATE OF PROCEDURE:  11/19/2005  DATE OF DISCHARGE:                                 OPERATIVE REPORT   PREOPERATIVE DIAGNOSIS:  Paroxysmal and persistent atrial arrhythmias,  resistant to cardioversion and the patient being not eligible for pulmonary  vein isolation.   POSTOPERATIVE DIAGNOSIS:  Paroxysmal and persistent atrial arrhythmias,  resistant to cardioversion and the patient being not eligible for pulmonary  vein isolation.   PROCEDURE PERFORMED:  AV junction ablation.   DESCRIPTION OF PROCEDURE:  Following the obtaining of informed consent, the  patient was brought to the electrophysiology laboratory and placed on the  fluoroscopic table in supine position.  After routine prep and drape,  cardiac catheterization was performed with local anesthesia and conscious  sedation.  At the end of the procedure, the catheter was removed.  Hemostasis was obtained.  The patient was transferred back to the floor in  stable condition.   CATHETERS:  A 7 French 4 mm deflectable tip ablation catheter was inserted  via an SR0 sheath in the right femoral vein to mapping sites at the  tricuspid annulus.  An HV interval was measured at 56 msec.  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  After further mapping, an H V interval of 85 msec was identified and  at that site, the AV ratio was 1:1.  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  The patient was then observed without recurrence of  intrinsic conduction.   The patient's device was interrogated and reprogrammed to the DDD mode at a  rate of  90 beats per minute, mode switch was on.  The patient tolerated the  procedure without  apparent complications.           ______________________________  Duke Salvia, M.D.     SCK/MEDQ  D:  11/19/2005  T:  11/19/2005  Job:  [id number]   cc:   Georga Hacking, M.D.  Fax: [phone]  Email: [email or web address]   Electrophys lab   [facility] pacemaker cl

## 2010-10-24 NOTE — H&P (Signed)
NAME:  [name], PERTUIT NO.:  [id number]   MEDICAL RECORD NO.:  [id number]                   PATIENT TYPE:  EMS   LOCATION:  MAJO                                 FACILITY:  [facility]   PHYSICIAN:  Darden Palmer., M.D.         DATE OF BIRTH:  1946-01-24   DATE OF ADMISSION:  07/04/2002  DATE OF DISCHARGE:                                HISTORY & PHYSICAL   REASON FOR ADMISSION:  Leg pain.   HISTORY:  This 65 year old male is admitted for treatment of possible DVT,  recurrent atrial flutter, and noncompliance.  He has a prior history of  rheumatic heart disease with a Bjork-Shiley mitral valve replacement at age  73 in 44.  He failed to return for followup for many years after that and  quit taking his Coumadin and was found to have several cerebral emboli due  to noncompliance with Coumadin.  In May 2002, presented with unstable angina  and was found to have a severe stenosis in the circumflex and received  stenting at that time.  The right coronary artery was totally occluded with  collaterals.  LAD had minimal disease.  He also had cardiomyopathy with an  EF of approximately 25%.  Medical compliance has been some problem with him  over the years and he would have periods of time where he would stop taking  his Coumadin or run out of his medications.  He has had recurrent admissions  for recurrent atrial flutter and was placed on amiodarone.  He was most  recently admitted from March 30, 2002 to April 04, 2002 with  subtherapeutic anticoagulation and recurrent atrial flutter.  During that  admission, he was seen by Dr. Sherryl Manges, who thought that he would be a  candidate for ablation; however, the patient was unable to see Dr. Graciela Husbands in  followup afterwards.  He also has had a previous mesenteric embolus and has  had a laparotomy for that within the past year.   He had recently had difficulty obtaining some of his medications and has  also  failed to return for Coumadin followup.  He presented to the nurse at  work with complaints of leg swelling and pain with exertion in his right leg  over the past several days.  He was found to be in rapid atrial flutter and  was admitted through the emergency room.  He denies shortness of breath or  significant chest pain.  He is vague about when he took his last dose of  Coumadin but states that he has been out of several of his medicines for  several days.   PAST MEDICAL HISTORY:  History of rheumatic fever, history of atrial  flutter, history of hyperlipidemia, history of CAD and mesenteric embolus.   PAST SURGICAL HISTORY:  Mitral valve replacement, exploratory laparotomy  previously.   ALLERGIES:  None.   MEDICATIONS:  Medications which he is supposed to  be on include:  1. Amiodarone 200 mg daily.  2. Coumadin 7.5 daily.  3. Lanoxin 0.25 daily.  4. Accupril 10 mg daily.  5. Zocor 20 mg daily.  6. Lasix 40 mg daily.  7. Coreg 6.25 b.i.d.   FAMILY HISTORY:  The patient is divorced.  He currently lives with a  girlfriend.  He works for [employer].  He was supposed to stop smoking but he  has gone back to smoking previously and smokes up to a pack per day.  He  does not use alcohol to excess.  He also has a history of some psychiatric  problems which may have been in part related to his previous stroke and this  has been a problem for him in the past.  Family history is positive for  rheumatic fever.  No premature cardiac disease.   REVIEW OF SYSTEMS:  He denies significant weight loss.  He wears glasses.  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  He has no difficulty swallowing.  He has no abdominal pain.  No genitourinary problems.  He does have leg  pain, as noted above.  No angina, no hemoptysis, no recent neurologic  symptoms other than as noted above.  The remainder of the review of systems  is unremarkable.   PHYSICAL EXAMINATION:  GENERAL:  He is a thin male  appearing slightly older  than stated age.  He is oriented x3.  VITAL SIGNS:  Blood pressure is 120/85, pulse is currently 150.  SKIN:  Seborrheic dermatitis over his face  HEENT:  He wears glasses.  EOMI, PERLA.  C&S clear.  Fundi unremarkable.  Pharynx negative.  NECK:  Supple without masses.  No thyromegaly or JVD.  LUNGS:  Clear today.  CARDIAC:  A rapid rhythm.  There was normal S1.  Valve clicks are well  heard.  There was no S3 or murmur.  ABDOMEN:  Soft, nontender.  Midline scar is noted.  No hepatosplenomegaly or  aneurysm.  EXTREMITIES:  Femoral pulses are 2+, peripheral pulses are somewhat  diminished.  His right leg is swollen and slightly tender in the calf  greater than the left leg.  There are also some scratches present on the  upper aspect of his right thigh.  NEUROLOGIC:  Grossly normal.   LABORATORY DATA:  A 12-lead ECG shows recurrent atrial flutter.  Chest x-ray  showed cardiomegaly.  No congestive heart failure.   IMPRESSION:  1. Leg pain with swelling, rule out deep vein thrombosis or rule out femoral     arterial embolus.  2. Recurrent atrial flutter.  3. Medical noncompliance with subtherapeutic Coumadin of 1.2.  4. Mild anemia.  5. Coronary artery disease with previous stenting of circumflex May 2002 and     previous occlusion of right coronary artery.  6. Prosthetic Bjork-Shiley mitral valve.  7. 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  8. History of abdominal embolus due to nontherapeutic anticoagulation.  9. Cigarette abuse with chronic obstructive pulmonary disease.   RECOMMENDATIONS:  A very complex patient who has a repeated history of  medical noncompliance.  He will be heparinized and placed on intravenous  Cardizem for rate control.  His amiodarone will be increased.  Consider a TE-  guided catheter ablation for the atrial flutter since he has had several recurrent admissions now for atrial flutter.  He also needs to  have a case  manager or social services consult to see if we can get the disease  management  program of his insurance company interested in helping prevent  recurrent hospitalizations due to noncompliance.                                               Darden Palmer., M.D.     WST/MEDQ  D:  07/04/2002  T:  07/04/2002  Job:  [id number]   cc:   Duke Salvia, M.D. [facility]

## 2010-10-24 NOTE — Discharge Summary (Signed)
[place]  Patient:    [name], [name]                      MRN: [id number] Adm. Date:  [id number] Disc. Date: 06/10/00 Attending:  Eleanora Neighbor Dictator:   Jennet Maduro. Earl Gala, R.N., A.N.P. CC:         Darden Palmer., M.D.   Discharge Summary  ADDENDUM:  Mr. Pelaez is a 65 year old male who had previous mitral valve replacement dating back to 10.  He has had recurrent atrial fibrillation and has documented noncompliance.  He was readmitted to the hospital this admission for recurrent atrial fibrillation and was found to have an INR very subtherapeutic at 1.1.  He was subsequently started back on his Coumadin. Originally plans were made for him to be discharged on June 08, 2000, if his INR was satisfactory; however, it continued to climb quite slowly.  Today on June 10, 2000, after receiving a total of 52 mg of Coumadin during this admission, INR is now up to 1.9.  He is felt to be a satisfactory candidate for discharge today.  DISCHARGE CONDITION:  Stable.  DISCHARGE MEDICATIONS: 1. Lanoxin 0.25 daily. 2. Coumadin 5 mg tablets take one and a half tablets daily. 3. Cardizem CD 120 daily.  FOLLOW-UP APPOINTMENT:  He will need to have a protime now and next Monday on June 14, 2000.  He will need to see W. Ashley Royalty., M.D., in two weeks.  His INR at discharge today on June 10, 2000, is 1.9. DD:  06/10/00 TD:  06/10/00 Job: [id number]

## 2010-10-24 NOTE — Cardiovascular Report (Signed)
NAMERALIEGH, [name] NO.:  [id number]   MEDICAL RECORD NO.:  [id number]          PATIENT TYPE:  INP   LOCATION:  2025                         FACILITY:  [facility]   PHYSICIAN:  Georga Hacking, M.D.DATE OF BIRTH:  1945-12-20   DATE OF PROCEDURE:  11/20/2005  DATE OF DISCHARGE:                              CARDIAC CATHETERIZATION   FINAL DIAGNOSIS:  1.  Atrial fibrillation with rapid ventricular response.      1.  Atrioventricular nodal ablation done by Dr. Graciela Husbands on this admission.      2.  Permanent pacemaker previously inserted.  2.  Congestive heart failure - resolved.  3.  Transient renal insufficiency due to over-diuresis.  4.  Glucose intolerance with elevated fasting blood glucose.  5.  Cardiomyopathy.  6.  Prosthetic Bjork-Shiley mitral valve.  7.  Previous history of multiple arterial embolus due to suboptimal      anticoagulation and noncompliance.  8.  Coronary artery disease with previous stent to the circumflex and known      occlusion of the right coronary artery.  9.  Hyperlipidemia, under treatment.   CONSULTATIONS:  Dr. Sherryl Manges.   PROCEDURES:  A-V nodal ablation.   HISTORY:  The patient is a 65 year old male who has a previous Bjork-Shiley  mitral valve placed in 1982.  He has had multiple cerebral emboli as well as  arterial emboli in the past and has had paroxysmal atrial fibrillation and  has had 3 cardioversions this year and has failed to maintain sinus rhythm.  During his last hospitalization his amiodarone level was subtherapeutic and  he had a repeat cardioversion but went back into atrial fibrillation.  He  was seen in consultation by Dr. Ladona Ridgel who felt that the next option would  be an A-V nodal ablation to control rapid atrial flutter and fibrillation.  He has previously been turned down for a left atrial procedure by Dr.  Sampson Goon in [place].  He was seen in the office for an appointment  the day of admission and  was found to be in rapid atrial fibrillation but  was not symptomatic.  He was started on Lanoxin and arrangements were made  for him to go into the hospital for an A-V nodal ablation.  However, that  evening he developed worsening dyspnea and was transported to the [facility] Emergency Room originally.  He was given BiPAP and intravenous Lasix  and his shortness of breath improved.  He was transferred to [facility]  and was admitted for treatment of congestive heart failure and treatment of  rapid atrial flutter and fibrillation.  Please see the previously dictated  History and Physical for remainder of the details.   HOSPITAL COURSE:  On admission he was found to be in rapid atrial flutter  with variable block.  Chest x-ray showed congestive heart failure.  Laboratory data showed an INR of 2 on admission.  Glucose was 158, potassium  was 3.5, BUN was 14, creatinine was 1.5.  He was diuresed initially.  His  BUN rose to 26 and creatinine to 2.2.  His ACE  inhibitors were held and his  Lasix was held.  He was seen in consultation by Dr. Graciela Husbands who felt that he  would benefit from an atrial flutter and A-V nodal ablation.  BNP was  somewhat elevated.  Cardiac enzymes showed very minimal elevation of  troponin but negative MB.  Amiodarone level was drawn and is pending at the  time of dictation.  Random glucoses were elevated and a hemoglobin A1c was  drawn.   The patient underwent an A-V nodal ablation by Dr. Sherryl Manges on November 19, 2005, and tolerated this well.  Dr. Graciela Husbands felt that he would need to be  paced at a rate of 90 for 2 weeks, then 80 for 2 weeks and then at 70  following that.  The patient was asymptomatic following this.  His ACE  inhibitor was transiently held because of hypotension, but this all resolved  and was able to be reinstituted.  He was discharged at this time in improved  condition.   CURRENT MEDICINES:  Include:  1.  Aspirin 81 mg daily.  2.  Coumadin  7.5 mg daily.  3.  Crestor 20 mg daily.  4.  Lasix 40 mg daily.  5.  Quinapril 10 mg daily.  6.  Amiodarone 200 mg daily which represents a reduction in dose.  7.  Lanoxin 0.25 mg daily.  8.  He is to eventually be on Coreg CR 40 mg daily and to finish up his      Coreg 12.5 b.i.d.   It is recommended that he followup in 2 weeks for an appointment with me and  also followup with Dr. Graciela Husbands and Dr. Ladona Ridgel.  He is instructed to take his  medicines properly, to not miss medication dosages, to weigh daily and to  avoid alcohol, cigarettes and excess sodium.      Georga Hacking, M.D.  Electronically Signed     WST/MEDQ  D:  11/20/2005  T:  11/20/2005  Job:  [id number]   cc:   Marcene Duos, M.D.  Fax: [phone]   Doylene Canning. Ladona Ridgel, M.D.  [address]  [address]  [place]  [place] [address]   Duke Salvia, M.D.  [address]  [address]  [place]  [place] [address]

## 2010-10-24 NOTE — H&P (Signed)
NAME:  [name], [name] NO.:  [id number]   MEDICAL RECORD NO.:  [id number]                   PATIENT TYPE:  INP   LOCATION:  6526                                 FACILITY:  [facility]   PHYSICIAN:  Georga Hacking, M.D.             DATE OF BIRTH:  1945/07/17   DATE OF ADMISSION:  03/30/2002  DATE OF DISCHARGE:                                HISTORY & PHYSICAL   HISTORY OF PRESENT ILLNESS:  The patient is a 65 year old gentleman with a  history of mitral stenosis, mitral valve replacement and atrial  fibrillation/flutter.  He was admitted to the hospital with an episode of  atrial flutter with a rapid ventricular response.   The patient has a history of rheumatic heart disease.  He had mitral valve  replacement at age 63.  He was first seen in our practice in 1975 and  ultimately had a Bjork-Shiley mitral valve placed by Dr. Olga Millers in 1982.  He was lost to followup and re-presented in 1990.  He had had a cerebral  embolus at that time.  He has also had a mesenteric embolus.  He has had  some problems with noncompliance with his medications.   The patient was last admitted to the hospital in July.  Since that time, he  has done fairly well and has not had any significant episodes of atrial  fibrillation with rapid ventricular response.  This evening, he awoke with a  very fast and irregular heart beat and presented to the emergency room.  He  was found to have atrial flutter with a rapid ventricular response and we  were called to evaluate him.   CURRENT MEDICATIONS:  1. Amiodarone 200 mg a day.  2. Coumadin 5 mg a day.  3. Lanoxin 0.25 mg a day.  4. Accupril 10 mg a day.  5. Zocor 20 mg a day.  6. Lasix 40 mg a day.   ALLERGIES:  He has no known drug allergies.   PAST MEDICAL HISTORY:  1. Rheumatic fever -- history of mitral stenosis.  He is status post Bjork-     Shiley mitral valve replacement in 1982 by Dr. Olga Millers.  2. Paroxysmal atrial  flutter.  3. Hyperlipidemia.  4. History of coronary artery disease -- status post PTCA and stenting of     his circumflex artery in October of 2002.  5. History of embolic stroke.  6. History of a mesenteric embolus.   SOCIAL HISTORY:  The patient continues to smoke one pack a day.  He drinks  alcohol occasionally.   FAMILY HISTORY:  His family history is positive for rheumatic fever.   REVIEW OF SYSTEMS:  His review of systems was reviewed.  He denies any fever  or congestion.  He does state that he has a cough that worsened tonight.  He  denies any problems with his eyes, ears, nose and throat.  He has had some  chest pressure but no real angina.   PHYSICAL EXAMINATION:  GENERAL:  On exam, he is a middle-aged gentleman in  no acute distress.  He is alert and oriented x3 and his mood and affect are  normal.  VITAL SIGNS:  His current heart rate is 105 and is irregular.  His blood  pressure is 183/55.  HEENT/NECK:  Exam reveals 2+ carotids.  He has no JVD.  There is no  thyromegaly.  LUNGS:  Lungs are clear.  HEART:  His heart is irregularly irregular.  He has a mechanical S1 and his  heart is tachycardic.  ABDOMEN:  His abdominal exam reveals good bowel sounds.  EXTREMITIES:  He has no edema.  He has good pulses.  NEUROLOGIC:  Exam was nonfocal.   LABORATORY AND ACCESSORY CLINICAL DATA:  His telemetry monitor reveals  atrial flutter with a variable block.   His EKG reveals atrial flutter with a 2:1 block.   His laboratory data is pending.   IMPRESSION AND PLAN:  He presents with recurrent atrial flutter.  He had a  similar admission in July of 2003.  He has been on amiodarone on a daily  basis since that time.  We will check an amiodarone level.  We will place  him on a Cardizem drip.  We will collect cardiac enzymes.  Dr. Georga Hacking will see him in the morning.     Vesta Mixer, M.D.                    Georga Hacking, M.D.    PJN/MEDQ  D:  03/30/2002   T:  03/30/2002  Job:  [id number]   cc:   Darden Palmer., M.D.  [address], [address]  [place]  [place] [address]  Fax: [phone]

## 2010-10-24 NOTE — Discharge Summary (Signed)
NAME:  [name], [name] NO.:  [id number]   MEDICAL RECORD NO.:  [id number]                   PATIENT TYPE:  INP   LOCATION:  3712                                 FACILITY:  [facility]   PHYSICIAN:  Darden Palmer., M.D.         DATE OF BIRTH:  Jul 28, 1945   DATE OF ADMISSION:  07/02/2003  DATE OF DISCHARGE:  07/05/2003                                 DISCHARGE SUMMARY   FINAL DIAGNOSES:  1. Atrial flutter -- resolved.     a. Status post atrial flutter ablation.  2. Congestive heart failure, multifactorial, due to ischemia as well as     idiopathic cardiomyopathy.  3. Suboptimal anticoagulation.  4. Medical noncompliance.  5. Prior history of strokes.  6. Cigarette abuse.  7. Coronary artery disease with previous stent to circumflex and previous     known occlusion of right coronary artery.  8. Previous history of multiple arterial emboli with mesenteric emboli,     arterial emboli to leg and previous strokes.  9. Previous history of prosthetic Bjork-Shiley mitral valve.   HISTORY:  [age]-year-old male who has a history of mitral valve  replacement and has had recurrent atrial flutter.  He had atrial flutter  ablation in August of 2004.  He developed chest discomfort beginning at home  as a pressure in his chest as well as shortness of breath.  He was found to  be in atrial flutter with a rapid response and was begun on treatment for  this.  He was admitted with rapid atrial flutter.  Please see the previously  dictated history and physical for remainder of the details.   HOSPITAL COURSE:  The patient was placed on heparin because of a low INR.  His white count was 19,200, hemoglobin was 13.4, hematocrit 39.8.  He also  had evidence of some cellulitis involving his foot that had previously been  evaluated as an outpatient.  His chemistry panel showed a sodium of 137,  potassium of 4.6, chloride of 105, CO2 of 26, glucose of 100, BUN 14,  creatinine of 1.4.  Liver enzymes were normal.  CPK/serial enzymes were  negative for troponin, which was minimally elevated at 0.09.  Chest x-ray  showed some mild bilateral infiltrates.  EKG showed atrial flutter.  His  amiodarone was increased and he was placed on IV heparin because the  Coumadin was subtherapeutic.  He was given additional doses of Coumadin as  well as intravenous heparin and his Coumadin levels gradually came up to an  INR of 2.6.  Amiodarone was increased and amiodarone level was drawn.  He  was placed on Ancef 1 g q.12 h. with improvement in his cellulitis.  By  July 05, 2003, his symptoms have improved to the point and he converted  back to sinus rhythm and he was discharged home in stable condition.  The  importance of stopping smoking was discussed with him.  He  was given a  nicotine patch and has previously received smoking cessation instruction.  The patient insisted smoking had nothing to do with his condition and would  become agitated when we discussed smoking cessation with him.  He is  discharged at this time in improved condition on:   DISCHARGE MEDICATIONS:  1. Amiodarone 200 mg b.i.d.  2. Coreg 6.25 mg b.i.d.  3. Zocor 20 mg daily.  4. Accupril 10 mg daily.  5. Lasix 40 mg daily.  6. Aspirin 81 mg daily.  7. He is also to continue Keflex b.i.d.   DISCHARGE INSTRUCTIONS:  He is to call if there are problems.   FOLLOWUP:  We will see in followup in 1 week.                                                Darden Palmer., M.D.    WST/MEDQ  D:  07/05/2003  T:  07/06/2003  Job:  [id number]   cc:   Marcene Duos, M.D.  [address] [place]  [place] [address]  Fax: [phone]

## 2010-10-24 NOTE — H&P (Signed)
NAME:  DECOREY, WAHLERT NO.:  [id number]   MEDICAL RECORD NO.:  [id number]          PATIENT TYPE:  INP   LOCATION:  1824                         FACILITY:  [facility]   PHYSICIAN:  Ulyses Amor, MD DATE OF BIRTH:  11-Nov-1945   DATE OF ADMISSION:  10/06/2005  DATE OF DISCHARGE:                                HISTORY & PHYSICAL   [name] is a 65 year old white man who is admitted to [facility] for recurrent atrial fibrillation.   The patient has a long history of recurrent atrial fibrillation and atrial  flutter.  He has been admitted on multiple occasions over the years for this  problem, and he has undergone repeated cardioversions.  He was most recently  cardioverted on April 20.  The patient was sitting at home this evening when  he noticed the onset of a rapid and irregular heart beat.  This was not  associated with any chest pain, tightness, heaviness, pressure or squeezing,  nor was it associated with any dizziness, lightheadedness, syncope or near-  syncope.  He presents to the emergency department, where he was found to be  in atrial fibrillation with a ventricular rate of 130 beats per minute.  He  was given two doses of Cardizem IV 20 mg, which resulted in a ventricular  rate in the 80s.  However, he has remained in atrial fibrillation and hence  he is admitted to the hospital for further management.   The patient has an extensive cardiac history.  He has undergone mitral valve  replacement with a Bjork-Shiley valve for mitral stenosis.  He has undergone  stenting of the circumflex.  He has a permanent pacemaker in place.  In  addition, he has a history of congestive heart failure.   The patient has a number of risk factors for coronary disease, including  hypertension, dyslipidemia and smoking (discontinued).  There is no history  of diabetes mellitus.  There is a family history of coronary artery disease  (father).   MEDICATIONS:   Aspirin, amiodarone, Coreg, warfarin, Crestor, furosemide and  Quinapril.   ALLERGIES:  None.   SOCIAL HISTORY:  The patient lives alone.  He is a retired Curator.  He is  divorced.  He has one daughter and one son.  He continues to drink alcohol  and has a past history of alcohol abuse.  He no longer smokes, as described  above.   FAMILY HISTORY:  There is a family history of coronary artery disease  (father).  His mother died of a stroke.  There is no other significant  family history.   REVIEW OF SYSTEMS:  No new problems related to his head, eyes, ears, nose,  mouth, throat, lungs, gastrointestinal system, genitourinary system, or  extremities.  There is no history of neurologic or psychiatric disorder.  There is no history of fever, chills or weight loss.   PHYSICAL EXAMINATION:  VITAL SIGNS:  Blood pressure 105/74, pulse 88 and  irregularly irregular, respirations 20, temperature 97.4.  GENERAL:  The patient was a middle-aged white man in no discomfort.  He  was  alert, oriented, and appropriate and responsive.  HEENT:  Head, eyes, nose and mouth were normal.  NECK:  Without thyromegaly or adenopathy.  Carotid pulses were palpable  bilaterally and without bruits.  CARDIAC:  An irregular rate and rhythm.  There was no murmur, rub click or  gallop.  Prosthetic valve sounds were crisp.  CHEST:  No chest wall tenderness was noted.  The lungs were clear.  ABDOMEN:  Soft and nontender.  There was no mass, hepatosplenomegaly, bruit,  distention, rebound, guarding or rigidity.  Bowel sounds were normal.  RECTAL, GENITAL:  Examinations not performed as they were not pertinent to  the reason for acute care hospitalization.  EXTREMITIES:  Without edema, deviation or deformity.  Radial and dorsalis  pedal pulses were palpable bilaterally.  NEUROLOGIC:  The brief screening neurologic survey was unremarkable.   The electrocardiogram revealed atrial fibrillation with a ventricular rate   of 128 beats per minute.  Nonspecific ST and T-wave changes were noted.  The  chest radiograph, according to the radiologist, demonstrated evidence of  mild congestive heart failure.  Potassium was 4.5, BUN 23, creatinine  pending.  The initial set of cardiac markers revealed a myoglobin of 66.4,  CK-MB 2.1, and troponin less than 0.05.  White count was 9.1 with a  hemoglobin of 13.9 and hematocrit of 41.1.  The second set of cardiac  markers revealed a myoglobin of 71.5, CK-MB 1.1, and troponin less than  0.05.  pulse oximetry was 97% on room air.   IMPRESSION:  1.  Recurrent atrial fibrillation.  Status post multiple cardioversions,      most recently on April 20.  2.  Status post mitral valve replacement.  3.  Congestive heart failure.  Echocardiogram estimated to be 25%.  4.  Permanent pacemaker.  5.  Coronary artery disease.  Status post circumflex stent.  6.  Peripheral vascular disease.  7.  Dyslipidemia.  8.  Hypertension.  9.  History of alcohol abuse.   PLAN:  1.  Telemetry.  2.  Serial cardiac enzymes.  3.  Intravenous diltiazem as needed for rate control.  4.  No heparin, as INR is 1.8.  5.  Further measures per Dr. Donnie Aho.      Ulyses Amor, MD  Electronically Signed     MSC/MEDQ  D:  10/06/2005  T:  10/06/2005  Job:  [id number]   cc:   Georga Hacking, M.D.  Fax: [phone]  Email: [email or web address]

## 2010-10-24 NOTE — Op Note (Signed)
NAME:  [name], [name]                         ACCOUNT NO.:  [id number]   MEDICAL RECORD NO.:  [id number]                   PATIENT TYPE:  INP   LOCATION:  3738                                 FACILITY:  [facility]   PHYSICIAN:  Quita Skye. Hart Rochester, M.D.               DATE OF BIRTH:  [birth year]   DATE OF PROCEDURE:  07/06/2002  DATE OF DISCHARGE:                                 OPERATIVE REPORT   PREOPERATIVE DIAGNOSIS:  Ischemic right leg secondary to suspected embolus  of the right popliteal artery.   POSTOPERATIVE DIAGNOSIS:  Possible embolus right popliteal artery, plus  significant tibial occlusive disease.   PROCEDURE:  1. Right popliteal thromboembolectomy with thrombectomy right anterior     tibial artery.  2. Right popliteal to posterior tibial bypass using reversed saphenous vein     graft from the right leg with intraoperative arteriogram.   SURGEON:  Quita Skye. Hart Rochester, M.D.   ASSISTANT:  Coral Ceo, P.A.   ANESTHESIA:  General endotracheal.   DESCRIPTION OF PROCEDURE:  The patient was taken to the operating room and  placed in the supine position at which time the right lower extremity was  prepped with Betadine scrub and solution, and draped in the routine sterile  manner.  A medial incision was made below the knee over the popliteal fossa,  being careful not to injure the saphenous vein. The popliteal artery was  exposed below the knee, dissected down to the origin of the anterior tibial  artery and the tibioperoneal trunk which was thickened on examination. The  popliteal artery appeared to be filled with thrombus.  6000 units of heparin  given intravenously and transverse opening made in the popliteal artery just  proximal to the origin of the anterior tibial. There was fresh thrombus  throughout.  The Fogarty catheter was passed up the popliteal artery and  about a 5 cm segment of organized thrombus retrieved followed by excellent  inflow.  The Fogarty was then  passed down the anterior tibial artery, but  would only go about 10 cm where it met resistance on multiple passes.  Thrombus was retrieved to that point. The tibioperoneal trunk was  chronically occluded. The transverse opening in the popliteal artery was  then reclosed with a continuous 6-0 Prolene suture and it was decided to  bypass from the popliteal to the posterior tibial artery which was patent on  the angiogram distal to its origin. Therefore, the incision was extended,  the posterior tibial artery exposed about 5 cm distal to its origin, where  it was a soft, hypoperfused vessel.  The saphenous vein was then harvested  from the same incision after ligating its branches with 4 and 5-0 silk ties  and dividing them. It was gently dilated with heparinized saline, marked for  orientation purposes.  It was an adequate vein being about 3.5 mm in size.  Popliteal artery was reexposed  as was the posterior tibial artery. The  longitudinal opening made in the posterior tibial artery with the 15 blade  and extended with the Potts scissors. It would accept a 2.5 mm dilator  distally and a Fogarty would go down to the ankle. The vein was used in a  reversed fashion, it was spatulated, and anastomosed end-to-side with 6-0  Prolene.  The popliteal artery was opened longitudinally with the 15 blade  and the Potts scissors and the distal end of the vein was anastomosed end-to-  side with 6-0 Prolene. Clamps were then released and there was an excellent  pulse in the vein graft and a palpable posterior tibial pulse distal to the  anastomosis with good Doppler flow at the ankle.  Intraoperative arteriogram  revealed a widely patent anastomosis with runoff through the posterior  tibial artery to the ankle, anterior tibial artery remained totally occluded  upper third of the leg. The peroneal artery filling slowly, but  being patent. Protamine was given to reverse the heparin. Following adequate   hemostasis, the wound was closed in layers with Vicryl and clips. Sterile  dressing was applied. The patient was taken to the recovery room in  satisfactory condition.                                               Quita Skye Hart Rochester, M.D.    JDL/MEDQ  D:  07/06/2002  T:  07/06/2002  Job:  [id number]

## 2010-10-24 NOTE — Cardiovascular Report (Signed)
[place]  Patient:    QUANTRELL, SPLITT Visit Number: [id number] MRN: [id number]          Service Type: MED Location: [phone] Attending Physician:  Koren Bound Dictated by:   Darden Palmer., M.D. Proc. Date: 03/14/01 Admit Date:  03/12/2001                          Cardiac Catheterization  PROCEDURE: 1. Left heart catheterization with coronary angiogram. 2. Left ventriculogram. 3. Percutaneous transluminal coronary angioplasty and stenting of the    circumflex marginal branch.  INDICATIONS:  A 65 year old male with previous mitral valve replacement and unstable angina.  INR was 1.7 this morning.  CARDIOLOGIST:  Darden Palmer., M.D.  DESCRIPTION OF PROCEDURE:  The patient was brought to the catheterization lab and was prepped and draped in the usual manner.  After Xylocaine anesthesia, a 6-French sheath was placed in the right femoral artery percutaneous with a single anterior wall needle stick.  Angiograms were made using 6-French catheters and a 30 cc ventriculogram was performed.  Arrangements were made to do angioplasty and stenting of the circumflex.   The right coronary artery was demonstrated to be occluded, and there was minimal LAD disease.  The sheath was exchanged for a 7-French sheath.  ACT was measured and was 164. Heparin 3000 units was administered with an ACT of 250, and he was given an additional 1000 units of heparin.  A 7-French 3.0 Voda selected the left main artery.  A high-torque floppy J guidewire was advanced down through the lesion with only mild difficulty.  The lesion was directly stented with an 8 x 3 mm Zeta multilink stent, dilated to 14 atmospheres.  There was some mild narrowing on the proximal edge of the stent, and the decision was made to place a second 8 mm Zeta with overlap of this segment and dilated this to 13 atmospheres.  This gave an excellent angiographic and  luminal result, and post dilatation angiograms were made.  The sheath was sutured in place, and he was returned to the angioplasty care center in stable condition.  HEMODYNAMIC DATA:  Aorta post contrast 129/79, LV post contrast 129/17.  ANGIOGRAPHIC DATA:  Left ventriculogram:  Performed in the 30 degree RAO projection.  The aortic valve was normal.  The [place] Shiley mitral valve was seen in the mitral position with apparent normal leaflet motion.  The left ventricle is dilated. The left ventricle has significant hypokinesis with inferior wall hypokinesis and apical hypokinesis noted.  The estimated ejection fraction was around 20 to 25%.  Coronary arteries arise and distribute normally.  The left main coronary artery is somewhat long.  There is a very mild distal narrowing involving the origin of the circumflex and LAD.  The left anterior descending contains mild irregularities but was largely free of disease.  The circumflex marginal branch has a very eccentric 95% proximal stenosis prior to a large marginal branch as well as a continuation branch.  There is moderate disease distal to this stenosis and also in the marginal branch.  The right coronary artery is a codominant vessel and is subtotally occluded and fills by collaterals from the left coronary system.  Post dilatation angiograms of the circumflex reveal 0% residual stenosis.  The previous proximal edge dissection has now been covered, and there is no significant stenosis noted of this vessel at the site of  the previous lesion.  IMPRESSION: 1. Successful stenting of the circumflex with stenosis going from 95% to 0%. 2. Residual atherosclerotic disease involving the right coronary artery with    subtotal occlusion with left coronary collaterals. 3. Poor left ventricular function with an estimated ejection fraction of 20    to 25%. 4. Prosthetic mitral valve. Dictated by:   Darden Palmer., M.D. Attending  Physician:  Koren Bound DD:  03/14/01 TD:  03/14/01 Job: [id number]

## 2010-10-24 NOTE — Discharge Summary (Signed)
NAME:  [name], [name] NO.:  [id number]   MEDICAL RECORD NO.:  [id number]                   PATIENT TYPE:  INP   LOCATION:  2041                                 FACILITY:  [facility]   PHYSICIAN:  Darden Palmer., M.D.         DATE OF BIRTH:  11-Apr-1946   DATE OF ADMISSION:  03/30/2002  DATE OF DISCHARGE:  04/04/2002                                 DISCHARGE SUMMARY   FINAL DIAGNOSES:  1. Atrial flutter, resolved.  2. Chronic prosthetic mitral valve/Bjork-Shiley.  3. History of embolic cerebral vascular accident.  4. Cardiomyopathy.  5. Coronary artery disease with previous stenting of the circumflex 5/02.   CONSULTANTS:  Duke Salvia, M.D.   HISTORY:  This 65 year old male has a history of rheumatic heart disease and  a previous Bjork-Shiley valve. He has had multiple admissions for atrial  tachyarrhythmias, initially as atrial flutter. He has been on amiodarone in  the spring and since that time has had three hospitalization for atrial  flutter requiring cardioversion one time and other times when he would  convert spontaneously. He presented to the hospital with rapid irregular  heart beat and shortness of breath and was found to be in atrial flutter  again. Please see the previously dictated history and physical for remainder  of the details.   HOSPITAL COURSE:  Lab data on admission showed an INR of only 1.1;  hemoglobin is 9.9, hematocrit 29.2, white count is 12,800. CPK is 101,  troponin is 0.18. Albumin is 3.1. Sodium 137, potassium 4.7. Digoxin level  was less than 0.2. Amiodarone was less than 0.3. Urinalysis showed few  bacteria. Urine culture was unremarkable.   The patient was in atrial flutter on admission and spontaneously converted.  He was placed on heparin, and his Coumadin was reinitiated. The patient  stated he had been taking his Coumadin and had been therapeutic as an  outpatient. Dr. Sherryl Manges saw the patient in  consultation and felt that  he would benefit from an atrial flutter ablation, particularly since he has  had multiple recurrent episodes of atrial flutter; however, he would need to  be anticoagulated prior to this happening. He was maintained on heparin over  the weekend until his INR was greater than 2.5 and then was discontinued,  and he was discharged home to come back to have his flutter ablation done at  a later date. Coreg 3.125 mg was initiated while he was in the hospital. He  was discharged in improved condition on Coumadin 7.5 mg daily, Actopril 10  mg daily, amiodarone 200 mg daily, Lasix 40 mg daily, Zocor 20 mg daily,  Coreg 6.25 mg b.i.d. He is to be seen Friday for an office visit and is to  see Dr. Sherryl Manges to talk about having an ablation.  Darden Palmer., M.D.    WST/MEDQ  D:  04/04/2002  T:  04/04/2002  Job:  [id number]   cc:   Duke Salvia, M.D. [facility]
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
## 2010-10-24 NOTE — Discharge Summary (Signed)
NAMEELI, [name] NO.:  [id number]   MEDICAL RECORD NO.:  [id number]          PATIENT TYPE:  INP   LOCATION:  2002                         FACILITY:  [facility]   PHYSICIAN:  Salvatore Decent. Dorris Fetch, M.D.DATE OF BIRTH:  [birth year]   DATE OF ADMISSION:  06/07/2006  DATE OF DISCHARGE:                               DISCHARGE SUMMARY   CARDIOLOGIST:  Georga Hacking, M.D.   ELECTROPHYSIOLOGIST:  Duke Salvia, MD, [facility]   NEUROLOGIST:  Marolyn Hammock. Thad Ranger, M.D.   ADMISSION DIAGNOSIS:  Possible stroke.   DISCHARGE DIAGNOSES:  1. Recent cerebrovascular accident.  2. Thrombosed prosthetic mitral valve, Bjork-Shiley status post      removal.  3. Chronic atrial fibrillation on Coumadin.  4. Hypertension.  5. Hyperlipidemia.  6. Peripheral vascular disease.  7. History of gout.  8. Previous history of atrial ventricular nodal ablation.  9. Coronary artery disease status post previous stenting of circumflex      and now an occlusion in the right coronary artery.  10.Cardiomyopathy.  11.Postoperative acute blood loss anemia.  12.Postoperative volume over load.  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  14.History of noncompliance with Coumadin.  15.History of systemic emboli.  16.History of ethanol abuse.  17.Remote history of tobacco abuse.  18.History of ventral hernia.  19.Positive family history of heart disease.  20.Multiple laparotomies for emboli to his intra-abdominal organs.  21.Multiple embolic strokes.  22.Mitral valve replacement in 1982.  23.Status post re-do mitral valve replacement with pericardial tissue      valve.  24.Status post modified Mays procedure with radiofrequency ablation on      June 17, 2006.   CONSULTATIONS:  Dr. Donnie Aho was consulted for cardiology on June 07, 2006.  Dr. Kelli Hope was consulted for neurology on June 07, 2006.  On June 11, 2006, Dr. Charlett Lango for cardiothoracic  surgery was consulted.  On June 17, 2006 urology was consulted.  On  June 15, 2006 Dr. Sherryl Manges was consulted for EPS.   PROCEDURES:  On June 14, 2006 the patient underwent cardiac  catheterization by Dr. Viann Fish.  On June 17, 2006 the patient  underwent a re-do mitral valve replacement with 27 mm pericardial tissue  valve, placement of epicardial pacemaker leads XL, modified Cox-Mays III  with radiofrequency ablation by Dr. Charlett Lango.   HISTORY AND PHYSICAL:  This was a 65 year old male with a complex past  medical history.  He has a longstanding history of coronary artery  disease and previous mitral valve replacement for mitral valve disease  with a Bjork-Shiley mitral valve in 1982.  The patient has some cerebral  emboli due to non-compliance with his Coumadin back in the 1980s.  He  also has coronary artery disease and has known occluded right coronary  artery and previous stent of the circumflex several years ago.  The  patient has cardiomyopathy with an ejection fraction estimated to be  around 25%.  He has recurrent atrial fibrillation and flutter and had  repeated cardioversions and eventually was unable to maintain sinus  rhythm and eventually underwent  an AV nodal ablation.  He has also had a  permanent pacemaker implanted in the past.  He has had multiple emboli  to his gut and extremities.  He also has had a previous stroke and had a  very difficult time maintaining reliable anticoagulation due to non-  compliance.  The patient has had previous admissions for congestive  heart failure.   The patient was in his usual state of health and complained of some mild  left-sided abdominal pain.  While out today on June 07, 2006 the  patient developed some numbness in his left arm and had difficulty  holding car keys around 11:00 a.m.  The patient then dropped another  item and called our office.  He was advised to come to the emergency  room where  he was found to have some weakness in the left side.   HOSPITAL COURSE:  On admission to [facility] a code stroke was  called.  Initial CT scan showed old strokes but no acute bleed.  The  patient was then admitted for possible right brain stroke with left hand  grip and weakness.  Due to the patient having a prosthetic valve a TEE  will need to be done.  Dr. Thad Ranger for consulted for neurology and  recommended continuing Coumadin and baby aspirin.  The patient was  started on IV Heparin per pharmacy to keep his INR greater than 2.5.  Carotid duplex was done on June 09, 2006 which showed no internal  carotid artery stenosis.  CT scan showed the patient to have a small  right cardioembolic cerebrovascular accident/infarct with left hand  clumsiness.  Occupational therapy and physical therapy saw the patient  on June 09, 2006.  The patient had an echocardiogram on June 10, 2006.  He had a TEE on June 11, 2006 which showed tilting disc of the  Bjork-Shiley mitral valve prosthesis with thrombus attached to the valve  inlet and partial valve obstruction.  Valve area was 0.8 cm squared and  moderate severe left ventricular dysfunction with ejection fraction of  35%.  The patient was continuing Heparin and Coumadin.  His Heparin was  discontinued on June 09, 2006 due to his Coumadin being therapeutic.  Dr. Charlett Lango was asked to see the patient for evaluation of  his mitral valve on June 11, 2006.  The patient was re-started on  Heparin on June 11, 2006 and stopped his Coumadin.  The patient was  scheduled for a cardiac cath on June 14, 2006 which showed mild to  moderate pulmonary hypertension, long term patency of stents of the  circumflex coronary artery, occlusion of the right coronary artery and  mild ostial left main stenosis.  The patient's creatinine on June 12, 2006 was 1.8 and this will be rechecked prior to CABG.  On June 15, 2006 the  patient's creatinine was 1.46.  The patient was scheduled to  undergo a re-do mitral valve replacement on June 17, 2006.  The  patient's blood pressure was well controlled in the low 100s/70s with a  heart rate in the 70s.  He has remained afebrile.  He has remained  hemodynamically stable.  On June 16, 2006 EPS saw the patient who  recommended the patient undergo a Mays procedure and placement of two LV  epicardial leads.  On June 17, 2006 urology placed the patient's  Foley catheter without any difficulty.  He underwent a re-do mitral  valve replacement with a 27 mm  pericardial tissue valve Cox-Mays  procedure by Dr. Dorris Fetch without any complications.  The patient did  have epicardial lead placement also on June 17, 2006.  Postoperatively the patient has progressed slow but well.  The patient's  CBGs were slightly increased postoperatively and was maintained on  Lantus and a sliding scale insulin.  The patient's Lantus was  discontinued on June 22, 2006.  Sugars have been at baseline since  then.  They are about 123/127/152.  Postoperatively, the patient's blood  pressure has been well controlled.  He has been on Coreg twice daily and  ACE inhibitor was restarted.  The patient was restarted on amiodarone on  June 24, 2006.  On telemetry he remains ventricular paced and in  sinus rhythm in the 90s.  The patient had postoperative volume over  load.  He was being diuresed with Lasix and responding appropriately.  He was on Lasix 40 mg t.i.d.; however, on June 25, 2006 this was  decreased to b.i.d. due to the patient's being at below preoperative  weight.  His fluids were well controlled.  The patient had some acute  blood loss anemia and he did receive 1 unit of packed red blood cells on  June 21, 2006.  His hemoglobin and hematocrit did raise  appropriately.  The patient's hemoglobin is 9.0.  Postoperatively the  patient was restarted on his Coumadin.  Goal was  2.5 to 3.5.  The  patient was 1.8 on June 24, 2006 and was given 10 mg of Coumadin and  his INR rose to 3.1 on June 25, 2006.  We will be watching this  closely due to the re-initiation of amiodarone on June 24, 2006.  The  patient is ambulating well with cardiac rehab with a steady gait.  His  neuro status is at baseline.  He is doing his breathing exercises  appropriately.   The patient was possibly going to a skilled nursing facility; however,  after physical therapy evaluated the patient they said he could go home  with a home health RN and he would not need further physical therapy  needs.   DISCHARGE PHYSICAL EXAM:  VITAL SIGNS:  Blood pressure 92/63, heart rate  70, respirations 18, temperature 98.6, O2 sat 91% on room air.  I and O  -350.  Weight 71.1 kg.  Preoperative weight 74 kg.  His blood sugars  were 161/146.  Telemetry sinus rhythm at 90s, ventricular paced. CARDIAC:  Regular rate and rhythm.  RESPIRATION:  Decreased breath sounds at bases bilaterally.  ABDOMEN:  Benign.  EXTREMITIES:  Positive edema in lower extremities; however, this is  improving.  Incisions appear dry and intact.   BMP showed a sodium of 133, potassium 4.3, chloride 98, CO2 30, BUN 33,  creatinine 1.36, bicarb 128.  His INR was 3.1 and PT was 33.7.   DISCHARGE DISPOSITION:  The patient will be discharged home in the next  one to two days provided his heart rate remains stable, his INR remains  therapeutic and he is ambulating without any difficulties.   MEDICATIONS:  1. Aspirin 81 mg p.o. daily.  2. Coreg 6.25 mg p.o. b.i.d.  3. Lisinopril 20 mg p.o. daily.  4. Crestor 20 mg p.o. daily.  5. __________ every 4 hours p.r.n.  6. Zetia 10 mg p.o. daily.  7. Coumadin.  Dose will be given at time of discharge.  8. __________ 4 mg p.o. b.i.d.  9. KCl 20 mEq p.o. daily.  10.Amiodarone 400 mg p.o.  b.i.d.  11.Digoxin 0.25 mg two daily.  12.Multivitamin daily.   INSTRUCTIONS:  The patient  is instructed to follow a low salt, low fat  diabetic diet.  No driving, heavy lifting __________.  The patient is  __________ four times daily and increase it to as tolerated.  He is to  continue his breathing  exercises.  He will have home health RN to  assist in needs.  He may shower, cleanse incisions with mild soap and  water.  He is to call the office if any wound problems should arise.   FOLLOWUP:  The patient will followup with Dr. Tyrone Sage on July 22, 2006 at 11:15 a.m.  He is to call Dr. York Spaniel office for an appointment  in two weeks.  The patient is to call Dr. Donnie Aho postoperatively and set  up an appointment to have his INR checked.  The date of appointment will  be given to the patient at the time of discharge.      Dominic Holster, PA    ______________________________  Salvatore Decent Dorris Fetch, M.D.    JMW/MEDQ  D:  06/25/2006  T:  06/26/2006  Job:  [id number]   cc:   Georga Hacking, M.D.

## 2010-10-24 NOTE — Op Note (Signed)
NAMERENDELL, THIVIERGE NO.:  [id number]   MEDICAL RECORD NO.:  [id number]          PATIENT TYPE:  OIB   LOCATION:  2854                         FACILITY:  [facility]   PHYSICIAN:  Darden Palmer., M.D.DATE OF BIRTH:  [birth year]   DATE OF PROCEDURE:  05/22/2004  DATE OF DISCHARGE:                                 OPERATIVE REPORT   PROCEDURE:  Cardioversion.   SURGEON:   INDICATIONS FOR PROCEDURE:  Recurrent atrial flutter with symptoms.   The patient was NPO after midnight and had anesthesia by Dr. Georgina Pillion with  150 mg of Pentothal.  Cardioversion was done with biphasic defibrillation  with AP patches with 50 watts resulting in reversion to sinus rhythm.  He  tolerated the procedure well.   IMPRESSION:  Successful cardioversion of atrial flutter.      Kristine Royal   WST/MEDQ  D:  05/22/2004  T:  05/22/2004  Job:  [id number]

## 2010-10-24 NOTE — Discharge Summary (Signed)
[name], CANUPP NO.:  [id number]   MEDICAL RECORD NO.:  [id number]          PATIENT TYPE:  INP   LOCATION:  3713                         FACILITY:  [facility]   PHYSICIAN:  Darden Palmer., M.D.DATE OF BIRTH:  23-Jan-1946   DATE OF ADMISSION:  08/07/2004  DATE OF DISCHARGE:  08/17/2004                                 DISCHARGE SUMMARY   FINAL DIAGNOSES:  1.  Recurrent atrial fibrillation and flutter.  2.  Prosthetic mitral valve.  3.  Congestive heart failure.  4.  Coronary artery disease, with previous stent.  5.  Chronic obstructive pulmonary disease.  6.  Recurrent abdominal pain.  7.  Medical noncompliance.  8.  History of multiple arterial emboli.   PROCEDURE:  Insertion of permanent pacemaker by Dr. Lewayne Bunting.   CONSULTATIONS:  Dr. Lewayne Bunting and Dr. Lorelee New of General Surgery.   HISTORY:  This 65 year old male has a history of recurrent atrial flutter.  He has a prior mitral valve replacement with Bjork-Shiley Prosthesis for  mitral stenosis and has had recurrent atrial flutter with multiple  cardioversions.  He has been on amiodarone.  He has a history of coronary  artery disease with a stent placement in his circumflex.  He has a history  of congestive heart failure with depressed LV function, has a history of  multiple arterial emboli including cerebral, lower extremity emboli,  mesenteric emboli, due to noncompliance with his anticoagulation.  He also  has had abdominal pain in the past when the atrial flutter has been noted.  He had the onset of right lower quadrant pain, saw his primary physician who  determined his atrial flutter had recurred and he was sent to the emergency  room.  He has had no chest pain or shortness of breath and was admitted for  treatment of atrial flutter.  Please see the previously dictated history and  physical for the remainder of the details.   HOSPITAL COURSE:  The patients hemoglobin is 13.1,  hematocrit 38.5,  chemistry panel was normal, INR was only 1.6 on admission which was  subtherapeutic.  Sodium is 142, potassium 4.6 on admission.  Chemistry panel  was normal.  Serial enzymes were negative.  Troponin was 0.06.  Amiodarone  level was low at 0.8.  Urinalysis was unremarkable.  The patient was  admitted to the hospital and begun on intravenous heparin and his Coumadin  was increased.  INR was subtherapeutic on admission.  Amiodarone level was  also thought to be low and he was given additional amiodarone.  We consulted  Dr. Lewayne Bunting for electrophysiology and he had atrial flutter with a  normal ventricular response.  He has been difficult to control with  recurrent atrial flutter and has had a previous ablation.  EKGs were  reviewed and permanent pacemaker EnRhythm might be the best option.  Although he met the criteria for an ICD with his multiple comorbidities, we  were not certain that would be the best option.  He continued to have some  mild lower quadrant pain.  He was seen General Surgery  who felt he should  just be observed for this.  Abdominal CT showed a splenic infarct, but right  lower quadrant pain was not typical for this.  His Coumadin was discontinued  and his abdominal pain appeared to improve.  He had a pacemaker implanted on  August 12, 2004 with a Medtronic EnRhythm [id number], serial [id number].  The  leads were Medtronic 5076 atrial lead with serial #[id number], the  ventricular lead was a Medtronic [id number] serial [id number].  He tolerated this  well and had anti tachycardia pace making.  Coumadin was restarted following  the pacemaker and the pacemaker was stable.  He remained in atrial flutter  and was pacing some, and pacer site was without hematoma.  He continued on  amiodarone load and we kept him in the hospital until his INR was  therapeutic at which point the heparin was discontinued.  He was discharged  on August 16, 2004 in stable condition.    DISCHARGE MEDICATIONS:  1.  Amiodarone 200 mg b.i.d.  2.  Aspirin 81 mg.  3.  Dilacor X 6.25 b.i.d.  4.  Lasix 40 mg daily.  5.  Lisinopril 20 mg daily.  6.  Coumadin 7.5 mg daily.  7.  VYTORIN 10/40 mg daily.   He is to follow-up in the office in one week and is to have a pro time on  Tuesday.  He is to see Dr. Ladona Ridgel in three months, he is to call if there  are recurrent problems.       WST/MEDQ  D:  11/17/2004  T:  11/17/2004  Job:  [id number]

## 2010-10-24 NOTE — Procedures (Signed)
[place]  Patient:    WINTER, TREFZ Visit Number: [id number] MRN: [id number]          Service Type: MED Location: 2000 2023 01 Attending Physician:  Norman Clay Dictated by:   Darden Palmer., M.D. Proc. Date: 09/30/01 Admit Date:  09/28/2001   CC:         [facility]   Procedure Report  PROCEDURE:  Cardioversion.  INDICATION:  Atrial fibrillation.  DESCRIPTION OF PROCEDURE:  The patient was NPO after midnight and underwent general anesthesia by Cliffton Asters. Crews, M.D., with 150 mg of pentothal under anesthetic monitoring.  Cardioversion was done using AP patches with 200 watts, resulting in reversion to sinus rhythm after one shock.  He tolerated the procedure well.  IMPRESSION:  Successful cardioversion of atrial fibrillation. Dictated by:   Darden Palmer., M.D. Attending Physician:  Norman Clay DD:  09/30/01 TD:  10/01/01 Job: [id number]

## 2010-10-24 NOTE — H&P (Signed)
[place]  Patient:    [name], [name]                      MRN: [id number] Adm. Date:  [id number] Attending:  Lorre Nick CC:         Dr. Donnie Aho   History and Physical  PRIMARY CARDIOLOGIST: Dr. Donnie Aho.  CHIEF COMPLAINT: Shortness of breath and irregular heart beat.  HISTORY OF PRESENT ILLNESS: This is a 65 year old white male, with a history of mitral valve replacement in 1983, also with a history of paroxysmal atrial fibrillation, on chronic Coumadin, who presented to the emergency room with shortness of breath.  Apparently he was in his usual state of health and awoke at 3 a.m. today with shortness of breath.  He noted his heart was beating irregularly and presented to the emergency room.  He denies chest pain, dizziness, syncope, lower extremity edema, cough, or fever or chills. Apparently he states he has been taking his medications as he is supposed to. He was recently told to increase his Coumadin to 10 mg q.d. this past Monday.  PAST MEDICAL HISTORY:  1. Paroxysmal atrial fibrillation, status post DC cardioversion in August     2000 and January 2002.  Chronic Coumadin therapy.  2. CVA x 4.  3. Decreased LV function.  PAST SURGICAL HISTORY: Status post Bjork-Shiley mitral valve replacement in 1982.  MEDICATIONS:  1. Coumadin 10 mg.  2. Digoxin 0.25 mg.  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  He smokes one pack per day of tobacco.  Occasional alcohol use.  FAMILY HISTORY: His brother had a history of rheumatic heart disease.  His father died at 71 of an MI.  His mother died of a CVA.  ALLERGIES: None.  PHYSICAL EXAMINATION:  VITAL SIGNS: Blood pressure 106/92, heart rate 130.  Oxygen saturation 97% on room air.  GENERAL: This is a well-developed, well-nourished white male, in no acute distress.  HEENT: Benign.  NECK: Supple, without lymphadenopathy.  No bruits.  LUNGS: Crackles at the bases  bilaterally.  HEART: Irregularly irregular.  No murmurs, rubs, or gallops.  Normal S1 and S2.  Crisp mitral valve mechanical sounds.  ABDOMEN: Soft, nontender, nondistended.  Active bowel sounds.  No hepatosplenomegaly.  EXTREMITIES: No cyanosis, erythema, or edema.  LABORATORY DATA: A 12 lead EKG shows atrial fibrillation with rapid ventricular response, nonspecific ST-T wave abnormality.  Chest x-ray is pending.  WBC 9.8, hemoglobin 12.2, hematocrit 36.2, platelet count 256,000.  Sodium 138, potassium 4.9, chloride 109, bicarbonate 24, glucose 81, BUN 13, creatinine 1.1, calcium 8.6, total protein 7.1, albumin 3.6, AST 45, ALT 22, alkaline phosphatase 81, bilirubin 0.5.  PT 38.5, INR 6.2, PTT 66.  Troponin I 0.04.  ASSESSMENT:  1. Paroxysmal atrial fibrillation, now with new onset atrial fibrillation     with rapid ventricular response.  2. Systemic anticoagulation, currently overanticoagulated after recently     increasing Coumadin to 10 mg q.d.  3. History of Bjork-Shiley mitral valve replacement in 1982.  4. History of cerebrovascular accident.  PLAN:  1. Admit to telemetry bed.  2. Rule out myocardial infarction with CPK-MB and troponin levels.  3. IV Cardizem drip at 10 mg per hour and increase as needed to control     heart rate less than 100 beats per minute.  4. Digoxin 0.25 mg q.d.  5. Hold Coumadin secondary to supertherapeutic INR.  6. If patient does not convert on  his own will plan DC cardioversion on     Monday per Dr. Donnie Aho. DD:  01/15/01 TD:  01/16/01 Job: [id number]

## 2010-10-24 NOTE — Discharge Summary (Signed)
NAMEJEFFREY, [name] NO.:  [id number]   MEDICAL RECORD NO.:  [id number]          PATIENT TYPE:  INP   LOCATION:  3730                         FACILITY:  [facility]   PHYSICIAN:  Darden Palmer., M.D.DATE OF BIRTH:  June 06, 1946   DATE OF ADMISSION:  07/08/2004  DATE OF DISCHARGE:  07/13/2004                                 DISCHARGE SUMMARY   FINAL DIAGNOSES:  1.  Atrial flutter.  2.  Congestive heart failure.  3.  Prosthetic Bjork-Shiley mitral valve.  4.  Hypertension.  5.  Coronary artery disease.  6.  Hyperlipidemia.  7.  Past history of noncompliance.   PROCEDURE:  Cardioversion.   HISTORY:  This 65 year old male has history of recurrent atrial flutter and  multiple arterial emboli in the past.  He had sudden onset of right lower  quadrant pain and then had left chest pain.  Initial enzymes were negative.  His EKG showed atrial flutter.  He was given morphine and beta blocker, but  his blood pressure dropped.  He now complains of abdominal pain and has a  history of mesenteric embolus.  Please please the previously dictated  history and physical for remainder of the details.   HOSPITAL COURSE:  The patient was admitted to the hospital with rapid atrial  flutter and given a dose of intravenous Lasix.  That evening, he developed  increasing tachycardia, shortness of breath, and hypertension.  Diltiazem,  oxygen, and Lasix were administered.  He improved with BiPAP and Cardizem  was given.  Chest x-ray showed pulmonary edema.  He responded to diuretics,  oxygen, and Diltiazem.  The events of yesterday were reviewed.  His CT of  the abdomen was negative because of the mesenteric emboli.  He had recurrent  atrial flutter.  Echocardiogram showed decreased LV function, moderate left  atrial enlargement, normally functioning valve.  Cardioversion was done with  synchronized DC cardioversion with 50 watts resulted conversion to sinus  rhythm.  We talked with  Dr. Sampson Goon at [facility] about a left-  sided ablation and a amiodarone level was also to be checked.  Dr. Lady Deutscher did a transesophageal echocardiogram showing no significant thrombus,  mild atherosclerotic disease, normally appearing intra-atrial septum.  There  was spontaneous contrast noted in the left atrium and atrial appendage  noted.  He remained on Coumadin and heparin and he continued on heparin  until his Coumadin was again therapeutic and was discharged home.  He was  discharged in improved condition.   DISCHARGE MEDICATIONS:  1.  Amiodarone 200 mg t.i.d.  2.  Coreg 6.25 b.i.d.  3.  Quinapril 20 mg daily.  4.  Vytorin 10/40 mg daily.  5.  Baby aspirin daily.  6.  Lasix 40 mg two tablets daily.  7.  Coumadin 5 mg daily.   He is to be seen in the office in one week and is to have a pro time  scheduled on Tuesday.      WST/MEDQ  D:  09/01/2004  T:  09/01/2004  Job:  [id number]

## 2010-10-24 NOTE — Consult Note (Signed)
NAME:  [name], [name]                         ACCOUNT NO.:  [id number]   MEDICAL RECORD NO.:  [id number]                   PATIENT TYPE:  INP   LOCATION:  3738                                 FACILITY:  [facility]   PHYSICIAN:  Balinda Quails, M.D.                 DATE OF BIRTH:  [birth year]   DATE OF CONSULTATION:  07/04/2002  DATE OF DISCHARGE:                                   CONSULTATION   REASON FOR CONSULTATION:  New onset claudication, right lower extremity.   HISTORY OF PRESENT ILLNESS:  [name] is a 65 year old male with a  history of Bjork-Shiley mitral valve replacement for rheumatic heart  disease. The patient is very noncompliant with medical therapy including his  Coumadin. He was admitted under the supervision of Dr. Donnie Aho on 07/04/02  with a six to seven day history of new onset short distance claudication of  right lower extremity. He had also noted some swelling in his right calf.   The patient denies rest pain, night pain, or paresthesia.   Following admission, the patient underwent lower extremity Doppler  evaluation. This revealed no evidence of deep vein thrombosis. There was  imaged a probable embolism of the distal right popliteal artery and reduced  right lower extremity ankle brachial index at 0.83, left ABI 0.91.   PAST MEDICAL HISTORY:  1. Rheumatic fever.  2. Atrial flutter.  3. Hyperlipidemia.  4. Coronary artery disease.  5. Mesenteric embolus.  6. Status post mitral valve replacement with Bjork-Shiley prosthetic valve.  7. Exploratory laparotomy.   ALLERGIES:  None.   MEDICATIONS ON ADMISSION:  1. Amiodarone 200 mg daily.  2. Coumadin 7.5 mg daily (noncompliant).  3. Lanoxin 0.25 mg daily.  4. Accupril 10 mg daily.  5. Zocor 20 mg daily.  6. Lasix 40 mg daily.  7. Coreg 6.25 mg b.i.d.   SOCIAL HISTORY:  The patient is divorced. He currently lives with a  girlfriend. He works at __________ . He has smoked on and off for several  years  and is now smoking up to a pack of cigarettes per day. He does not use  alcohol in excess.   REVIEW OF SYMPTOMS:  The patient denies recent chest pain. No shortness of  breath, cough, or sputum production. He has had no significant weight  change. Appetite is good. No abdominal pain, nausea, vomiting, constipation,  or diarrhea. Denies dysuria, frequency, or hematuria.   No visual disturbance. No sensory or motor deficit.   PHYSICAL EXAMINATION:  GENERAL:  Thin, 65 year old male who appears  approximately his stated age. He is alert and oriented x3.  VITAL SIGNS:  BP 125/90, pulse 144 per minute and irregular, respirations 20  per minute.  HEENT:  The patient wears glasses. Extraocular movements are intact. Pupils  are equal and reactive. Mouth and throat are clear. Normocephalic.  NECK:  Supple. No thyromegaly, adenopathy. No carotid  bruits.  CHEST:  Clear to auscultation and percussion. No rales or rhonchi.  CARDIAC:  Rapid rhythm. Normal S1. Valve click noted. There is no S3 or S4.  ABDOMEN:  Soft and nontender. Well healed midline scar. No organomegaly or  masses felt. No abdominal bruits.  EXTREMITIES:  Femoral pulses 2+ bilaterally; 1+ right popliteal, 2+ left  popliteal pulse. Left foot reveals 2+ posterior tibial and 1+ dorsalis  pedis. Right foot reveals absent posterior tibial and dorsalis pedis pulses.  NEUROLOGICAL:  The patient is alert and oriented. Cranial nerves intact.  Strength equal bilaterally.   INVESTIGATIONS:  Lower extremity Doppler evaluation has been carried out  which reveals a right ABI of 0.83, left ABI of 0.91. Imaging reveals  evidence of probable right popliteal embolus. No evidence of deep vein  thrombosis.   IMPRESSION:  A 65 year old male with a history of prosthetic mitral valve  replacement and noncompliance with Coumadin. Presents with new onset short  distance right lower extremity claudication. Findings were consistent with  right popliteal  embolus. The patient will remain on heparin overnight and  undergo arteriography tomorrow.   He will likely require exploration of right popliteal with popliteal tibial  thromboembolectomy.                                               Balinda Quails, M.D.    PGH/MEDQ  D:  07/04/2002  T:  07/04/2002  Job:  [id number]

## 2010-10-24 NOTE — Op Note (Signed)
NAME:  [name], [name] NO.:  [id number]   MEDICAL RECORD NO.:  [id number]          PATIENT TYPE:  INP   LOCATION:  4731                         FACILITY:  [facility]   PHYSICIAN:  Georga Hacking, M.D.DATE OF BIRTH:  February 12, 1946   DATE OF PROCEDURE:  07/09/2005  DATE OF DISCHARGE:                                 OPERATIVE REPORT   PROCEDURE:  Cardioversion.   HISTORY:  The patient has had recurrent atrial fibrillation and flutter and  presented with uncontrolled rate.   The patient was n.p.o. after midnight and anesthesia was administered by Dr.  Autumn Patty with 125 mg of pentothal at the bedside. Cardioversion was  done using synchronized biphasic defibrillation with 75 watts resulting in  reversion to sinus rhythm.   IMPRESSION:  Successful cardioversion of atrial fibrillation and flutter.      Georga Hacking, M.D.  Electronically Signed     WST/MEDQ  D:  07/09/2005  T:  07/09/2005  Job:  [id number]   cc:   Marcene Duos, M.D.  Fax: [phone]

## 2010-10-24 NOTE — H&P (Signed)
[name]. [facility]  Patient:    [name], [name] Visit Number: [id number] MRN: [id number]          Service Type: EMS Location: [place] Attending Physician:  Armanda Heritage Dictated by:   Jennet Maduro Earl Gala, R.N., A.N.P.-C. Admit Date:  09/28/2001   CC:         Darden Palmer., M.D.  Elvina Sidle, M.D.   History and Physical  CHIEF COMPLAINT: "Unable to breathe."  HISTORY OF PRESENT ILLNESS: [name] is a 65 year old white male, who has multiple and very complex medical problems.  He presented to the emergency department earlier this morning after being awakened by his dog.  He was apparently short of breath.  He denied having any actual chest pain.  He denies any actual trigger, and states he has not had any sodium indiscretion. Upon his arrival here to the emergency room he was noted to have atrial flutter with rapid ventricular response and florid pulmonary edema.  He was subsequently diuresed in the emergency room and has already put out over 3000 cc of urine so far.  He has been given IV Lanoxin and now IV Cardizem to slow his ventricular response.  He is currently stable without complaints, and will subsequently be admitted for further evaluation.  PAST MEDICAL HISTORY:  1. Mitral stenosis, status post mitral valve replacement with a #29 mm     Bjork-Shiley valve in 1982.  2. Chronic Coumadin therapy.  3. Prior history of medical noncompliance.  4. Previous stroke.  5. Previous atrial fibrillation with prior cardioversion.  6. Known LV dysfunction.  Last 2D echocardiogram in July 2000 showing     ejection fraction of 20% with diffuse hypokinesis and moderate left     atrial enlargement.  7. 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  8. Probable psychiatric disorder.  9. Esophageal dysphagia.  ALLERGIES: None reported.  CURRENT MEDICATIONS: He currently states he is taking (doses are questionable):  1. Coumadin.  2.  Pacerone.  3. Digoxin.  4. Accupril.  5. Zocor.  FAMILY HISTORY: As previously recorded.  SOCIAL HISTORY: As previously recorded.  REVIEW OF SYSTEMS: He denies any chest pain, light headedness, or dizziness. He states that up until last night he was feeling well and was feeling well actually when he went to sleep.  Otherwise, Review Of Systems is negative.  PHYSICAL EXAMINATION:  GENERAL: He is currently in no acute distress.  He is not dyspneic at this point in time after being diuresed significantly.  VITAL SIGNS: Blood pressure is now 129/89, heart rate still 140s, respirations 20.  He is afebrile.  O2 saturation 99% with O2 in place.  SKIN: Warm and dry.  Color unremarkable.  NECK: Supple.  LUNGS: Scattered rales throughout.  CARDIAC: Irregular rhythm as well as tachycardia.  The valve is crisp.  ABDOMEN: Soft.  Positive bowel sounds.  EXTREMITIES: Trace amount of edema bilaterally.  LABORATORY DATA: INR is 4.7.  WBC 13,000, hemoglobin 11.6.  Cardiac enzymes first CK-MB is 142 with MB of 4.4.  Chemistries show sodium of 142, potassium 4.1, chloride 107, CO2 unavailable, BUN 22, creatinine 1.1, glucose 162.  EKG shows atrial flutter with diffuse ST-T wave changes.  Chest x-ray shows florid heart failure and increase in heart size.  IMPRESSION:  1. Acute congestive heart failure.  2. Recurrent atrial flutter with rapid ventricular response.  3. Known left ventricular dysfunction.  4. Prior medical noncompliance.  5. Mitral valve prosthesis in place.  6. Previous stroke.  PLAN:  1. He will be admitted to the service of Dr. Viann Fish.  2. He will be diuresed.  3. He has already been given IV Lanoxin as well as an IV bolus of Cardizem     without significant response in heart rate.  Will go ahead and rebolus     the patient and add continuous Cardizem infusion.  4. His other medicines will be continued.  5. He will be ruled out for myocardial infarction per  serial enzymes and     will follow chemistries along as well. Dictated by:   Jennet Maduro Earl Gala, R.N., A.N.P.-C. Attending Physician:  Armanda Heritage DD:  09/28/01 TD:  09/28/01 Job: [id number]

## 2010-10-24 NOTE — Consult Note (Signed)
NAME:  [name], [name] NO.:  [id number]   MEDICAL RECORD NO.:  [id number]                   PATIENT TYPE:  INP   LOCATION:  6526                                 FACILITY:  [facility]   PHYSICIAN:  Duke Salvia, M.D. [facility]           DATE OF BIRTH:  27-Oct-1945   DATE OF CONSULTATION:  03/30/2002  DATE OF DISCHARGE:                                   CONSULTATION   Thank you very much for asking me to see this patient in electrophysiology  consultation for recurrent atrial flutter.   HISTORY OF PRESENT ILLNESS:  The patient is a 65 year old gentleman with a  history of rheumatic heart disease, status post mitral valve prolapse with a  Bjork-Shiley valve in 1982.  The patient has had a variety of complications  from this, primarily repeat thromboembolism in the early 1990s associated  with inadequate anticoagulation.  The patient also had an acute ischemic  abdomen in January of 2003, but a pathology embolus was not documented.   Intercurrently the patient has had problems with significant left  ventricular dysfunction that is clear out of proportion to coronary disease.  He did have a myocardial infarction in the fall of 2002, at which time he  underwent circumflex PCI.  His ejection fraction has been estimated in the  20-25% range.   The patient has had a multitude of hospitalizations now for atrial  tachyarrhythmias.  Initially these were manifested as atrial fibrillation.  Amiodarone was started in the spring of this year and he has had now three  hospitalizations for atrial flutter with a cardioversion and one attempt at  spontaneous conversion on the other.  These are associated with abrupt onset  of dyspnea, but without significant palpitations.   At baseline, the patient is functional class II-III with no dyspnea with  moderate exertion, no resting edema, and no anorexia.   The patient denies syncope.  He has had transient neurological episodes  as  noted previously.   PAST MEDICAL HISTORY:  Notable primarily as above.  In addition, he has  hyperlipidemia.  There is a question of a psychiatric disorder that may have  been related to his strokes, as well as excessive alcohol use, and the  patient continues to smoke a pack per day.   SOCIAL HISTORY:  [place].  He works as a heavy  Chartered certified accountant.   ALLERGIES:  He has no known drug allergies.   MEDICATIONS:  His medications at present include:  1. Amiodarone 200 mg.  2. Coumadin 5 mg.  3. Lanoxin 0.25 mg.  4. Accupril 10 mg.  5. Zocor 20 mg.  6. Lasix 40 mg.  7. Intercurrent addition of Coreg.   PHYSICAL EXAMINATION:  GENERAL APPEARANCE:  The patient is in no acute  distress.  VITAL SIGNS:  His blood pressure is approximately 90/45 and his pulse is 79.  HEENT:  No ________, no xanthoma.  NECK:  The neck veins were about 7.  The carotids were brisk and full  bilaterally without bruits.  BACK:  Without kyphosis or scoliosis.  LUNGS:  Clear.  HEART:  The heart sounds had a mechanical S1 and a 2/6 systolic ejection  murmur.  The PMI was laterally displaced.  ABDOMEN:  Soft with active bowel sounds without midline pulsation or  hepatomegaly.  EXTREMITIES:  The femoral pulses were not examined.  The distal pulses were  intact.  There was no cyanosis, clubbing, or edema.  NEUROLOGIC:  Exam was grossly normal.  SKIN:  Warm and dry.   LABORATORY DATA:  The electrocardiogram demonstrated atrial flutter with  what appears to be a relatively typical pattern, particularly on telemetry  with 2:1 and more variable conduction.  The atrial cycle was 200 msec.  No  electrocardiogram was available in sinus rhythm, but telemetry demonstrates  sinus rhythm at 60 beats per minute with intervals of 0.16/0.010/0.040.   IMPRESSION:  1. Recurrent typical recurrent atrial flutter with an atrial cycle length of     200 msec with an antecedent history of atrial  fibrillation treated with     amniocentesis potentially converting to atrial flutter.  2. Rheumatic heart disease, status post Bjork-Shiley valve in 1982.  3. Left ventricular dysfunction with an ejection fraction of 20-25%, clearly     out of proportion to coronary disease with an intercurrent myocardial     infarction in 2002 with a circumflex percutaneous coronary intervention.  4. Class II-III congestive failure.  5. Recurrent emboli.     a. Cerebral in 1992.     b. Questionable mesenteric in 2003, but was not documented pathological.  6. Subtherapeutic INR.  7. 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   DISCUSSION:  The patient has recurrent atrial flutter which may be the  result of amiodarone and in the context of his atrial fibrillation.  It  appears to be typical in that it is isthmus dependent.  His mitral valve  surgery was accomplished via an antrotomy located in the intra-atrial  septum.  This too may be a boundary around which the flutter circulates.   The patient has a subtherapeutic INR and clearly this needs to be corrected.  As we cannot do the procedure in the next 24 hours, I would proceed with  anticoagulation with Coumadin and heparin.   RECOMMENDATIONS:  Based on the above, therefore I would:  1. Initiate heparin in conjunction with Coumadin.  2. Will tentatively plan RF catheter ablation.  I will schedule this in the     next week or two and this could be accomplished on a status of full     anticoagulation.   I have reviewed the substantial benefits, as well as the potential risks  with the patient and his family, including, but not limited to death, pacer  implantation, and cardiac perforation.  He understands these risks and is  willing to proceed.   Thank you for this consultation.                                               Duke Salvia, M.D. [facility]   SCK/MEDQ  D:  03/30/2002  T:  03/31/2002  Job:  [id number]   cc:   W. Ashley Royalty., M.D.  [address], [address]  [place]  [place] [address]  Fax: [phone]   [facility]   Electrophysiology Laboratory
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
## 2010-12-26 ENCOUNTER — Encounter: Payer: Self-pay | Admitting: *Deleted

## 2010-12-26 ENCOUNTER — Encounter: Payer: Self-pay | Admitting: Internal Medicine

## 2010-12-29 ENCOUNTER — Encounter: Payer: Medicare Other | Admitting: Internal Medicine

## 2011-01-13 IMAGING — CT CT ANGIO CHEST
1 of 3 series · 18 of 32 positions shown · IV contrast (APPLIED)
Comparison: 10/25/2006

Addendum Begins

Critical test results telephoned to Dr. Lujan by me at the time of
interpretation on 04/07/2010 at 6913 hours.
Addendum Ends
CLINICAL DATA: Chest pain with elevated D-dimer.
CT ANGIOGRAPHY CHEST WITH CONTRAST
TECHNIQUE: Multidetector CT imaging of the chest was performed
using the standard protocol during bolus administration of
intravenous contrast.  Multiplanar CT image reconstructions
including MIPs were obtained to evaluate the vascular anatomy.
Contrast:  86 cc Omnipaque 350

[Series 8: pulm embolism 1.0 b25f thins · axial · 0.68mm/px · z∈[+14,+269]mm · 18 of 285 slices shown]
[im 15/285  lung]
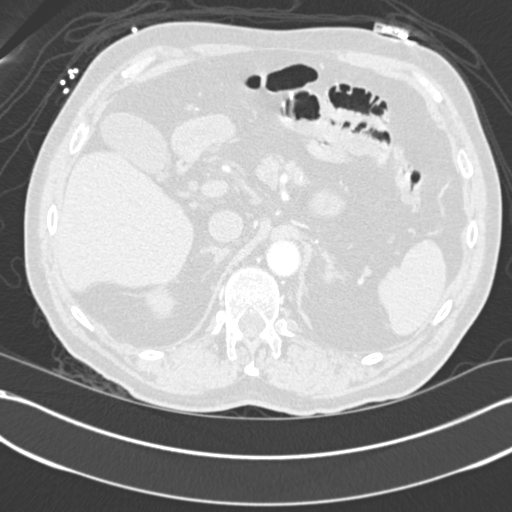
[im 29/285  mediastinal]
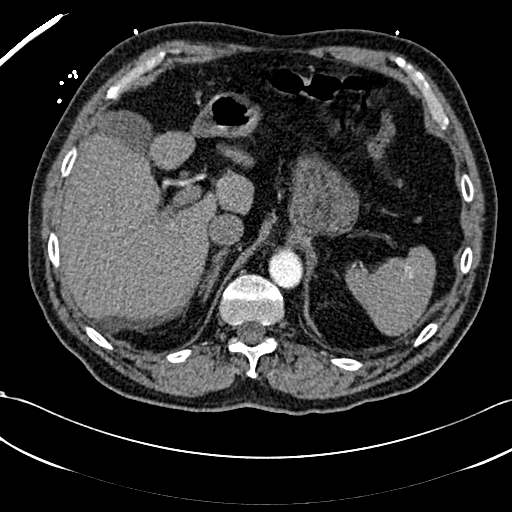
[im 57/285  lung]
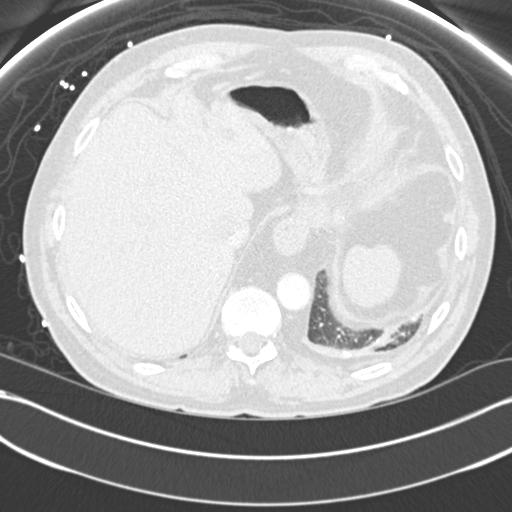
[im 72/285  mediastinal]
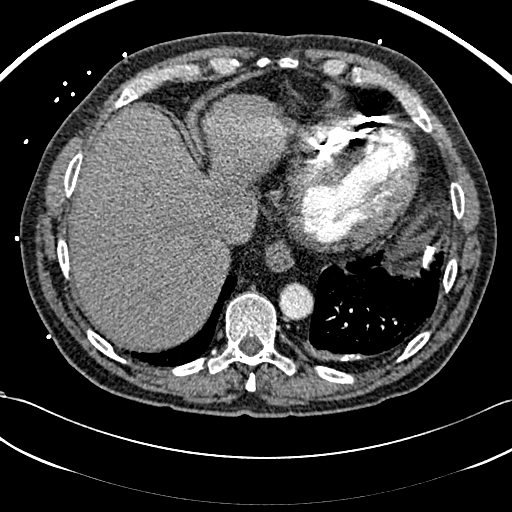
[im 86/285  lung]
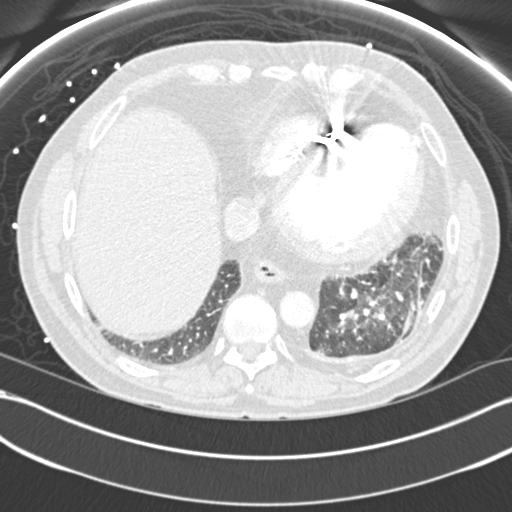
[im 95/285  mediastinal]
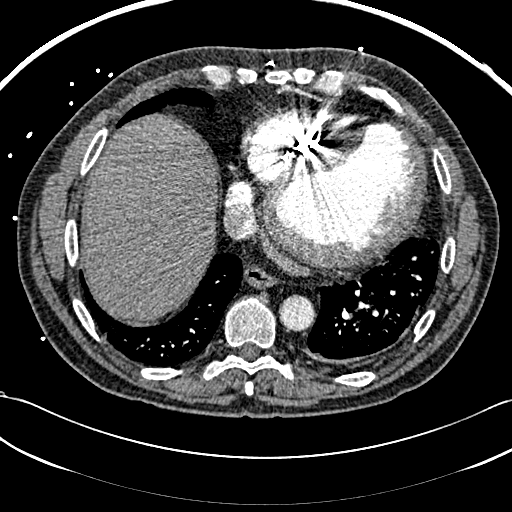
[im 100/285  lung]
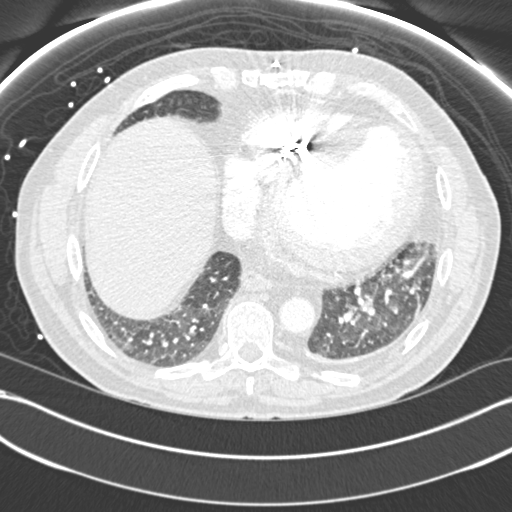
[im 128/285  mediastinal]
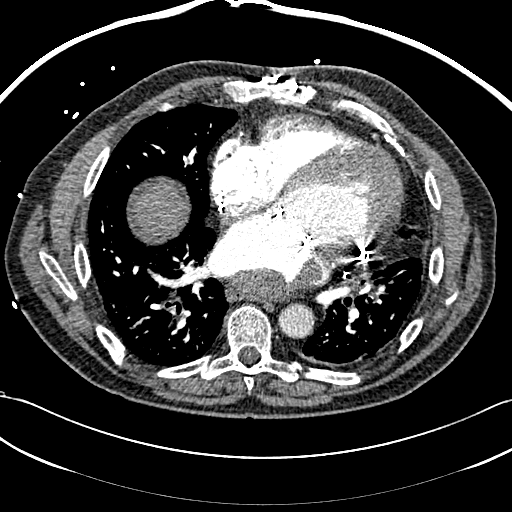
[im 133/285  lung]
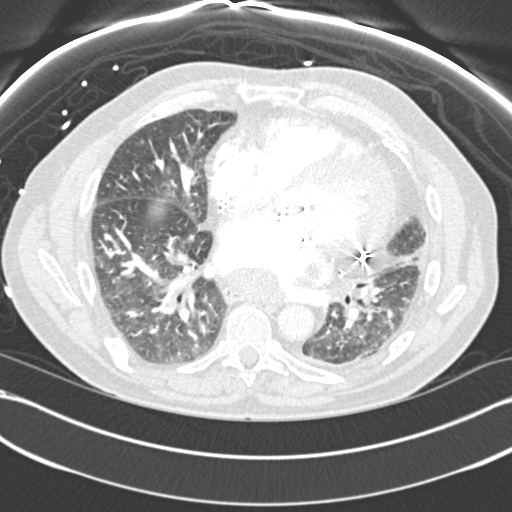
[im 143/285  mediastinal]
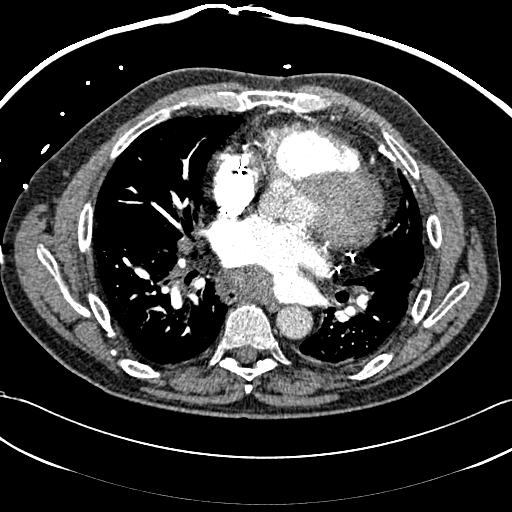
[im 157/285  lung]
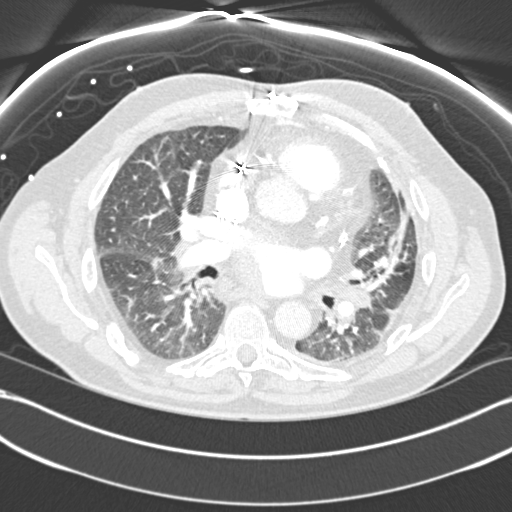
[im 185/285  mediastinal]
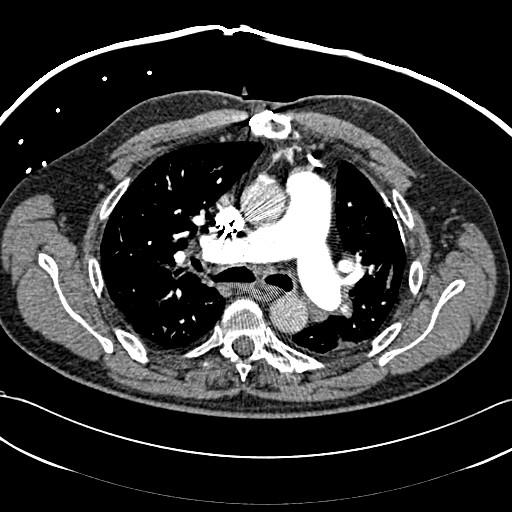
[im 190/285  lung]
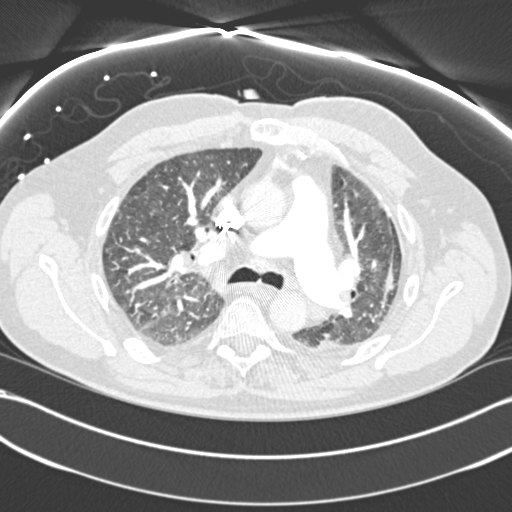
[im 199/285  mediastinal]
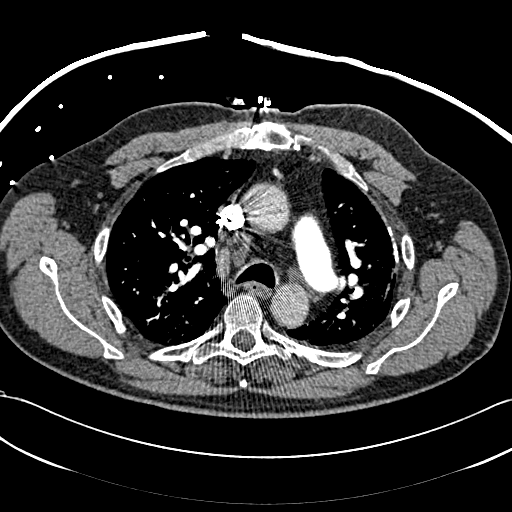
[im 214/285  lung]
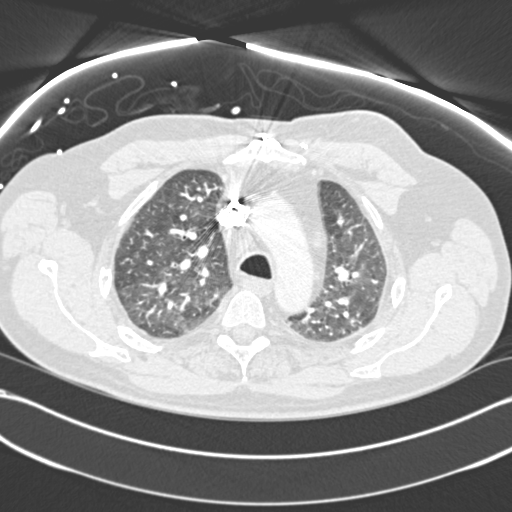
[im 228/285  mediastinal]
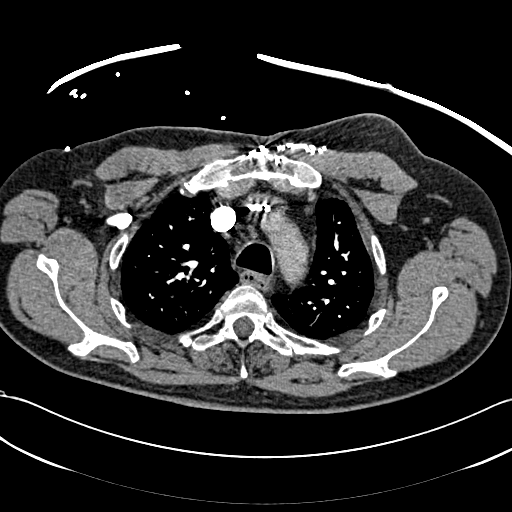
[im 256/285  lung]
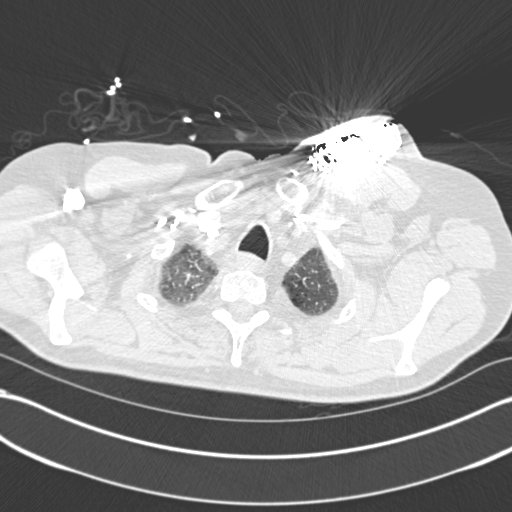
[im 270/285  mediastinal]
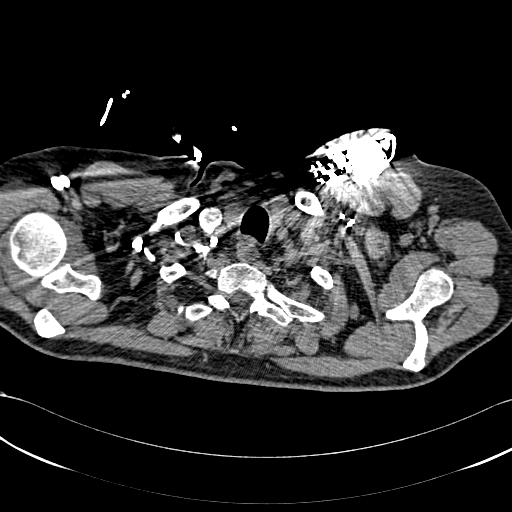

[18 of 32 positions shown; findings below may reference images not displayed]

FINDINGS: There is no filling defect within the opacified
pulmonary arteries to suggest the presence of an acute pulmonary
embolus.  The pulmonary arteries are markedly enlarged and taper
rapidly as they enter the lungs bilaterally, and imaging appearance
consistent with pulmonary arterial hypertension.  The heart is
enlarged.  No thoracic aortic aneurysm.  There is no dissection of
the thoracic aorta.  No pericardial or pleural effusion.

There is irregular wall thickening seen in the enlarged left atrium
which projects out into the lumen of the left atrium laterally to
the left (see image 53 of series 7 and image 96 of series 10).
Imaging features are felt to be most consistent with thrombus
adherent to the posterior wall of the left atrium.

The patient has a left-sided permanent pacemaker with intraluminal
and epicardial leads.

Lung windows show mild prominence of the interlobular septa with
some central ground-glass attenuation, suggesting edema.

There is no axillary lymphadenopathy.  7 mm short-axis prevascular
lymph node measured 11 mm in short axis previously.  Stable
appearance of the right paratracheal index lymph node.  Lymphoid
tissue in the hilar regions bilaterally is stable

Review of the MIP images confirms the above findings.
IMPRESSION: No CT evidence for acute pulmonary embolus.

The patient has an adherent filling defect in the left atrium,
extending along the posterior wall and off to the left, between the
pulmonary veins.  The left edge of this filling  defect projects
out into the left atrial lumen.  Imaging features are considered
most suspicious for an adherent thrombus.  Given the shape of the
lesion and the way it tracks along the posterior wall of the left
atrium, a left atrial neoplasm is considered less likely.

Critical test results telephoned to Dr. Lujan by me at the time of
interpretation on 04/07/2010 at 8642 hours.

## 2011-01-30 ENCOUNTER — Encounter: Payer: Self-pay | Admitting: Internal Medicine

## 2011-01-30 ENCOUNTER — Ambulatory Visit (INDEPENDENT_AMBULATORY_CARE_PROVIDER_SITE_OTHER): Payer: Medicare Other | Admitting: Internal Medicine

## 2011-01-30 DIAGNOSIS — I5023 Acute on chronic systolic (congestive) heart failure: Secondary | ICD-10-CM | POA: Insufficient documentation

## 2011-01-30 DIAGNOSIS — I472 Ventricular tachycardia, unspecified: Secondary | ICD-10-CM

## 2011-01-30 DIAGNOSIS — I2510 Atherosclerotic heart disease of native coronary artery without angina pectoris: Secondary | ICD-10-CM

## 2011-01-30 DIAGNOSIS — I428 Other cardiomyopathies: Secondary | ICD-10-CM

## 2011-01-30 DIAGNOSIS — I509 Heart failure, unspecified: Secondary | ICD-10-CM

## 2011-01-30 DIAGNOSIS — I4891 Unspecified atrial fibrillation: Secondary | ICD-10-CM

## 2011-01-30 DIAGNOSIS — I5022 Chronic systolic (congestive) heart failure: Secondary | ICD-10-CM

## 2011-01-30 LAB — ICD DEVICE OBSERVATION
AL IMPEDENCE ICD: 648 Ohm
BAMS-0001: 135 {beats}/min
BATTERY VOLTAGE: 3.03 V
FVT: 0
LV LEAD THRESHOLD: 1.5 V
PACEART VT: 0
RV LEAD THRESHOLD: 1 V
TOT-0002: 1
TZAT-0001ATACH: 1
TZAT-0001FASTVT: 1
TZAT-0002ATACH: NEGATIVE
TZAT-0004FASTVT: 8
TZAT-0004SLOWVT: 8
TZAT-0004SLOWVT: 8
TZAT-0005FASTVT: 88 pct
TZAT-0011FASTVT: 10 ms
TZAT-0011SLOWVT: 10 ms
TZAT-0011SLOWVT: 10 ms
TZAT-0012ATACH: 150 ms
TZAT-0012ATACH: 150 ms
TZAT-0012ATACH: 150 ms
TZAT-0012FASTVT: 200 ms
TZAT-0012SLOWVT: 200 ms
TZAT-0012SLOWVT: 200 ms
TZAT-0013FASTVT: 2
TZAT-0013SLOWVT: 2
TZAT-0013SLOWVT: 2
TZAT-0018ATACH: NEGATIVE
TZAT-0018FASTVT: NEGATIVE
TZAT-0020ATACH: 1.5 ms
TZAT-0020ATACH: 1.5 ms
TZAT-0020ATACH: 1.5 ms
TZAT-0020SLOWVT: 1.5 ms
TZAT-0020SLOWVT: 1.5 ms
TZON-0003ATACH: 450 ms
TZON-0003FASTVT: 270 ms
TZON-0003SLOWVT: 350 ms
TZON-0003VSLOWVT: 400 ms
TZON-0004SLOWVT: 16
TZST-0001ATACH: 4
TZST-0001FASTVT: 3
TZST-0001FASTVT: 5
TZST-0001SLOWVT: 4
TZST-0001SLOWVT: 6
TZST-0002ATACH: NEGATIVE
TZST-0002ATACH: NEGATIVE
TZST-0003FASTVT: 35 J
TZST-0003SLOWVT: 20 J
TZST-0003SLOWVT: 25 J
TZST-0003SLOWVT: 35 J
VENTRICULAR PACING ICD: 98.95 pct

## 2011-01-30 NOTE — Assessment & Plan Note (Signed)
No ischemic symptoms 

## 2011-01-30 NOTE — Assessment & Plan Note (Signed)
euvolemic Normal BIV ICD function

## 2011-01-30 NOTE — Assessment & Plan Note (Signed)
No recent VT/VF Continue coreg  Normal ICD function See Pace Art report No changes today

## 2011-01-30 NOTE — Assessment & Plan Note (Signed)
Device interrogation today reveals permanent atrial fibrillation He is s/p AV nodal ablation and s/p BiV ICD  No changes today Continue coumadin long term

## 2011-01-30 NOTE — Progress Notes (Signed)
[name] is a pleasant 65 y.o. 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  He previously has had a MDT Concerto BiV ICD implanted.  He was admitted with ventricular tachycardia previously but has not recently had ICD therapy delivered. The patient reports doing very well since having his ICD implanted and remains very active despite his age.   Today, he  denies symptoms of palpitations, chest pain, shortness of breath, orthopnea, PND, lower extremity edema, dizziness, presyncope, syncope, or neurologic sequela.  The patientis tolerating medications without difficulties and is otherwise without complaint today.   Past Medical History  Diagnosis Date  . 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 COPD (chronic obstructive pulmonary disease)   . Depression   . Hypertension   . Dyslipidemia   . Ventral hernia   . CAD (coronary artery disease)   . CVA (cerebral vascular accident)     multiple prior embolic strokes  . Hernia   . Chronic systolic congestive heart failure   . Bipolar disorder   . Anxiety   . Atrial fibrillation     persistent  . Alcohol abuse   . Complete heart block     s/p prior AV nodal ablation    Past Surgical History  Procedure Date  . Coronary artery bypass graft   . Hernia repair   . Mitral valve replacement     remote [facility] valve 8657 with redo tissue valve 06/2006  . Pacemaker placement     History   Social History  . Marital Status: Married    Spouse Name: N/A    Number of Children: N/A  . Years of Education: N/A   Occupational History  . Not on file.   Social History Main Topics  . Smoking status: Former Smoker    Quit date: 01/30/2003  . Smokeless tobacco: Not on file  . Alcohol Use: No  . Drug Use: No  . Sexually  Active: Not on file   Other Topics Concern  . Not on file   Social History Narrative  . No narrative on file    Family History  Problem Relation Age of Onset  . Stroke    . Heart disease      Not on File  Current Outpatient Prescriptions  Medication Sig Dispense Refill  . aspirin 81 MG tablet Take 81 mg by mouth daily.        . carvedilol (COREG) 6.25 MG tablet Take 6.25 mg by mouth 2 (two) times daily with a meal.        . furosemide (LASIX) 40 MG tablet Take 40 mg by mouth 2 (two) times daily.        . IPRATROPIUM BROMIDE IN Inhale 1 puff into the lungs as needed.        . potassium chloride (KLOR-CON) 20 MEQ packet Take 20 mEq by mouth daily.        Marland Kitchen warfarin (COUMADIN) 5 MG tablet Take 5 mg by mouth daily. Take 1 and 1/2 tablets daily except for 1 tablet on Friday and Sunday once a day.         ROS- all systems are reviewed and negative except as per HPI  Physical Exam: Filed Vitals:   01/30/11 0913  BP: 120/88  Pulse: 52  Height: 5\' 7"  (1.702 m)  Weight: 155 lb 6.4 oz (70.489 kg)    GEN- The patient is well appearing, alert and oriented x 3 today.   Head- normocephalic, atraumatic Eyes-  Sclera clear, conjunctiva pink Ears- hearing intact Oropharynx- clear Neck- supple, no JVP Lymph- no cervical lymphadenopathy Lungs- Clear to ausculation bilaterally, normal work of breathing Chest- ICD pocket is well healed Heart- Regular rate and rhythm(paced) GI-S/NT/ND +BS EXTR- no C/C, trace edema No focal neuro changes MS- no muscle atrophy   ICD interrogation today reviewed

## 2011-01-30 NOTE — Patient Instructions (Signed)
Your physician wants you to follow-up in: 12 months with Dr Allred You will receive a reminder letter in the mail two months in advance. If you don't receive a letter, please call our office to schedule the follow-up appointment.  Remote monitoring is used to monitor your Pacemaker of ICD from home. This monitoring reduces the number of office visits required to check your device to one time per year. It allows us to keep an eye on the functioning of your device to ensure it is working properly. You are scheduled for a device check from home on 05/01/2011. You may send your transmission at any time that day. If you have a wireless device, the transmission will be sent automatically. After your physician reviews your transmission, you will receive a postcard with your next transmission date.   

## 2011-02-18 ENCOUNTER — Emergency Department (HOSPITAL_COMMUNITY): Payer: Medicare Other

## 2011-02-18 ENCOUNTER — Emergency Department (HOSPITAL_COMMUNITY)
Admission: EM | Admit: 2011-02-18 | Discharge: 2011-02-18 | Disposition: A | Discharge status: Home / Self Care | Payer: Medicare Other | Attending: Emergency Medicine | Admitting: Emergency Medicine

## 2011-02-18 DIAGNOSIS — Z7901 Long term (current) use of anticoagulants: Secondary | ICD-10-CM | POA: Insufficient documentation

## 2011-02-18 DIAGNOSIS — M545 Low back pain, unspecified: Secondary | ICD-10-CM | POA: Insufficient documentation

## 2011-02-18 DIAGNOSIS — J449 Chronic obstructive pulmonary disease, unspecified: Secondary | ICD-10-CM | POA: Insufficient documentation

## 2011-02-18 DIAGNOSIS — I509 Heart failure, unspecified: Secondary | ICD-10-CM | POA: Insufficient documentation

## 2011-02-18 DIAGNOSIS — R109 Unspecified abdominal pain: Secondary | ICD-10-CM | POA: Insufficient documentation

## 2011-02-18 DIAGNOSIS — E785 Hyperlipidemia, unspecified: Secondary | ICD-10-CM | POA: Insufficient documentation

## 2011-02-18 DIAGNOSIS — K439 Ventral hernia without obstruction or gangrene: Secondary | ICD-10-CM | POA: Insufficient documentation

## 2011-02-18 DIAGNOSIS — I2510 Atherosclerotic heart disease of native coronary artery without angina pectoris: Secondary | ICD-10-CM | POA: Insufficient documentation

## 2011-02-18 DIAGNOSIS — Z79899 Other long term (current) drug therapy: Secondary | ICD-10-CM | POA: Insufficient documentation

## 2011-02-18 DIAGNOSIS — J4489 Other specified chronic obstructive pulmonary disease: Secondary | ICD-10-CM | POA: Insufficient documentation

## 2011-02-18 DIAGNOSIS — Z8673 Personal history of transient ischemic attack (TIA), and cerebral infarction without residual deficits: Secondary | ICD-10-CM | POA: Insufficient documentation

## 2011-02-18 DIAGNOSIS — I10 Essential (primary) hypertension: Secondary | ICD-10-CM | POA: Insufficient documentation

## 2011-02-18 DIAGNOSIS — Z950 Presence of cardiac pacemaker: Secondary | ICD-10-CM | POA: Insufficient documentation

## 2011-02-18 DIAGNOSIS — F319 Bipolar disorder, unspecified: Secondary | ICD-10-CM | POA: Insufficient documentation

## 2011-02-18 DIAGNOSIS — Z7982 Long term (current) use of aspirin: Secondary | ICD-10-CM | POA: Insufficient documentation

## 2011-02-18 DIAGNOSIS — K5900 Constipation, unspecified: Secondary | ICD-10-CM | POA: Insufficient documentation

## 2011-02-18 LAB — COMPREHENSIVE METABOLIC PANEL
ALT: 20 U/L (ref 0–53)
BUN: 11 mg/dL (ref 6–23)
CO2: 26 mEq/L (ref 19–32)
Calcium: 9.9 mg/dL (ref 8.4–10.5)
Creatinine, Ser: 0.97 mg/dL (ref 0.50–1.35)
GFR calc Af Amer: >60 mL/min (ref >60)
GFR calc non Af Amer: >60 mL/min (ref >60)
Glucose, Bld: 105 mg/dL — ABNORMAL HIGH (ref 70–99)

## 2011-02-18 LAB — CBC
HCT: 44.8 % (ref 39.0–52.0)
Hemoglobin: 15.4 g/dL (ref 13.0–17.0)
MCH: 30.2 pg (ref 26.0–34.0)
MCHC: 34.4 g/dL (ref 30.0–36.0)

## 2011-02-18 LAB — URINALYSIS, ROUTINE W REFLEX MICROSCOPIC
Bilirubin Urine: NEGATIVE
Glucose, UA: NEGATIVE mg/dL
Hgb urine dipstick: NEGATIVE
Ketones, ur: NEGATIVE mg/dL
Protein, ur: NEGATIVE mg/dL

## 2011-02-18 LAB — DIFFERENTIAL
Eosinophils Relative: 3 % (ref 0–5)
Lymphocytes Relative: 10 % — ABNORMAL LOW (ref 12–46)
Monocytes Absolute: 0.9 10*3/uL (ref 0.1–1.0)
Monocytes Relative: 9 % (ref 3–12)
Neutro Abs: 7.8 10*3/uL — ABNORMAL HIGH (ref 1.7–7.7)

## 2011-02-19 LAB — URINE CULTURE

## 2011-02-27 LAB — DIFFERENTIAL
Basophils Absolute: 0
Basophils Relative: 1
Basophils Relative: 0
Eosinophils Absolute: 0.2
Eosinophils Absolute: 0.1
Lymphs Abs: 1.3
Monocytes Absolute: 1
Monocytes Absolute: 1.2 — ABNORMAL HIGH
Monocytes Relative: 10
Neutro Abs: 7.6
Neutro Abs: 7.5
Neutrophils Relative %: 75

## 2011-02-27 LAB — I-STAT 8, (EC8 V) (CONVERTED LAB)
Acid-base deficit: 1
Bicarbonate: 25.3 — ABNORMAL HIGH
Chloride: 104
HCT: 43
Operator id: 294511
TCO2: 27
pCO2, Ven: 46.7
pH, Ven: 7.341 — ABNORMAL HIGH

## 2011-02-27 LAB — HEPATIC FUNCTION PANEL
AST: 170 — ABNORMAL HIGH
Albumin: 2.8 — ABNORMAL LOW
Bilirubin, Direct: 0.2
Bilirubin, Direct: 0.2
Bilirubin, Direct: 0.2
Indirect Bilirubin: 0.4
Indirect Bilirubin: 0.6
Total Bilirubin: 0.5
Total Bilirubin: 0.8
Total Bilirubin: 0.8

## 2011-02-27 LAB — HEPARIN LEVEL (UNFRACTIONATED)
Heparin Unfractionated: 0.35
Heparin Unfractionated: 0.72 — ABNORMAL HIGH
Heparin Unfractionated: <0.1 — ABNORMAL LOW
Heparin Unfractionated: 0.55
Heparin Unfractionated: 0.57
Heparin Unfractionated: 0.67
Heparin Unfractionated: 0.71 — ABNORMAL HIGH

## 2011-02-27 LAB — CBC
HCT: 33.2 — ABNORMAL LOW
HCT: 33.6 — ABNORMAL LOW
HCT: 35.5 — ABNORMAL LOW
HCT: 33.3 — ABNORMAL LOW
HCT: 33.4 — ABNORMAL LOW
HCT: 36.3 — ABNORMAL LOW
Hemoglobin: 11.2 — ABNORMAL LOW
Hemoglobin: 11.3 — ABNORMAL LOW
Hemoglobin: 11.9 — ABNORMAL LOW
Hemoglobin: 12.5 — ABNORMAL LOW
MCHC: 33.1
MCHC: 33.3
MCHC: 33.5
MCHC: 33.6
MCHC: 33.6
MCHC: 33.2
MCHC: 33.3
MCHC: 33.3
MCHC: 33.4
MCHC: 33.8
MCV: 87.9
MCV: 88.1
MCV: 88.2
MCV: 88.3
MCV: 88.5
MCV: 87.1
MCV: 87.2
MCV: 87.6
MCV: 88
Platelets: 242
Platelets: 256
Platelets: 268
Platelets: 201
Platelets: 255
RBC: 3.69 — ABNORMAL LOW
RBC: 3.83 — ABNORMAL LOW
RBC: 3.82 — ABNORMAL LOW
RBC: 3.84 — ABNORMAL LOW
RBC: 4.14 — ABNORMAL LOW
RDW: 15.2
RDW: 15.2
RDW: 15.7 — ABNORMAL HIGH
RDW: 15.1
RDW: 15.1
RDW: 15.1
RDW: 15.3
WBC: 7
WBC: 8.4

## 2011-02-27 LAB — COMPREHENSIVE METABOLIC PANEL
ALT: 202 — ABNORMAL HIGH
ALT: 226 — ABNORMAL HIGH
Alkaline Phosphatase: 100
BUN: 10
BUN: 13
CO2: 29
CO2: 25
CO2: 27
Calcium: 8.4
Calcium: 8.3 — ABNORMAL LOW
Chloride: 99
Creatinine, Ser: 1.01
Creatinine, Ser: 1.23
GFR calc non Af Amer: >60
GFR calc non Af Amer: >60
GFR calc non Af Amer: >60
Glucose, Bld: 85
Glucose, Bld: 147 — ABNORMAL HIGH
Glucose, Bld: 97
Sodium: 135
Total Bilirubin: 1.1

## 2011-02-27 LAB — PROTIME-INR
INR: 2 — ABNORMAL HIGH
INR: 3 — ABNORMAL HIGH
INR: 1.4
INR: 1.9 — ABNORMAL HIGH
INR: 3 — ABNORMAL HIGH
Prothrombin Time: 23.7 — ABNORMAL HIGH
Prothrombin Time: 15.6 — ABNORMAL HIGH
Prothrombin Time: 16.2 — ABNORMAL HIGH
Prothrombin Time: 17.1 — ABNORMAL HIGH
Prothrombin Time: 20.6 — ABNORMAL HIGH

## 2011-02-27 LAB — BASIC METABOLIC PANEL
CO2: 22
CO2: 25
CO2: 25
CO2: 26
Calcium: 8.2 — ABNORMAL LOW
Calcium: 8.5
Chloride: 107
Chloride: 105
Creatinine, Ser: 1.42
Creatinine, Ser: 1.59 — ABNORMAL HIGH
Creatinine, Ser: 1.63 — ABNORMAL HIGH
GFR calc Af Amer: >60
GFR calc Af Amer: 54 — ABNORMAL LOW
GFR calc Af Amer: >60
Glucose, Bld: 85
Glucose, Bld: 94
Potassium: 4
Potassium: 3.7
Sodium: 136

## 2011-02-27 LAB — URINALYSIS, ROUTINE W REFLEX MICROSCOPIC
Glucose, UA: NEGATIVE
Hgb urine dipstick: NEGATIVE
Ketones, ur: NEGATIVE
Protein, ur: 30 — AB
pH: 5.5

## 2011-02-27 LAB — CARDIAC PANEL(CRET KIN+CKTOT+MB+TROPI)
CK, MB: 1.8
Relative Index: INVALID
Total CK: 44
Total CK: 47
Troponin I: 0.09 — ABNORMAL HIGH

## 2011-02-27 LAB — B-NATRIURETIC PEPTIDE (CONVERTED LAB): Pro B Natriuretic peptide (BNP): 245 — ABNORMAL HIGH

## 2011-02-27 LAB — APTT: aPTT: 26

## 2011-02-27 LAB — CULTURE, BLOOD (ROUTINE X 2)
Culture: NO GROWTH
Culture: NO GROWTH
Culture: NO GROWTH

## 2011-02-27 LAB — URINE CULTURE
Colony Count: NO GROWTH
Culture: NO GROWTH

## 2011-02-27 LAB — AMYLASE
Amylase: 49
Amylase: 55

## 2011-02-27 LAB — URINE MICROSCOPIC-ADD ON

## 2011-02-27 LAB — LIPASE, BLOOD: Lipase: 22

## 2011-02-27 LAB — TSH: TSH: 0.613

## 2011-02-27 LAB — POCT I-STAT CREATININE
Creatinine, Ser: 1.2
Operator id: 294511

## 2011-02-27 LAB — HIGH SENSITIVITY CRP: CRP, High Sensitivity: 18.1 — ABNORMAL HIGH

## 2011-02-27 LAB — HEPATITIS PANEL, ACUTE
HCV Ab: NEGATIVE
Hep A IgM: NEGATIVE
Hep B C IgM: NEGATIVE

## 2011-02-27 LAB — DIGOXIN LEVEL
Digoxin Level: <0.2 — ABNORMAL LOW
Digoxin Level: <0.2 — ABNORMAL LOW

## 2011-02-27 LAB — POCT CARDIAC MARKERS
CKMB, poc: <1 — ABNORMAL LOW
Myoglobin, poc: 58.2
Troponin i, poc: <0.05

## 2011-02-27 LAB — AMIODARONE LEVEL: Amiodarone Lvl: 0.49 ug/mL — ABNORMAL LOW (ref 0.50–2.00)

## 2011-02-27 LAB — CK: Total CK: 61

## 2011-02-27 LAB — CK TOTAL AND CKMB (NOT AT ARMC): Relative Index: INVALID

## 2011-03-03 LAB — CBC
HCT: 36.9 — ABNORMAL LOW
Hemoglobin: 12.1 — ABNORMAL LOW
MCV: 85.5
RBC: 4.32
WBC: 11.1 — ABNORMAL HIGH

## 2011-03-03 LAB — COMPREHENSIVE METABOLIC PANEL
Alkaline Phosphatase: 64
BUN: 10
CO2: 26
Chloride: 106
Creatinine, Ser: 1.13
GFR calc non Af Amer: >60
Glucose, Bld: 124 — ABNORMAL HIGH
Potassium: 4.1
Total Bilirubin: 0.5

## 2011-03-03 LAB — POCT CARDIAC MARKERS
CKMB, poc: 1.6
Troponin i, poc: <0.05

## 2011-03-03 LAB — URINALYSIS, ROUTINE W REFLEX MICROSCOPIC
Bilirubin Urine: NEGATIVE
Glucose, UA: NEGATIVE
Ketones, ur: NEGATIVE
Nitrite: NEGATIVE
Protein, ur: NEGATIVE
pH: 5.5

## 2011-03-03 LAB — DIFFERENTIAL
Basophils Absolute: 0
Basophils Relative: 0
Lymphocytes Relative: 11 — ABNORMAL LOW
Monocytes Absolute: 1.2 — ABNORMAL HIGH
Neutro Abs: 8.5 — ABNORMAL HIGH

## 2011-03-03 LAB — PROTIME-INR: INR: 4 — ABNORMAL HIGH

## 2011-03-03 LAB — LIPASE, BLOOD: Lipase: 24

## 2011-03-05 LAB — CBC
HCT: 33.2 — ABNORMAL LOW
HCT: 34.8 — ABNORMAL LOW
HCT: 38.8 — ABNORMAL LOW
Hemoglobin: 11.2 — ABNORMAL LOW
Hemoglobin: 11.2 — ABNORMAL LOW
MCHC: 32.5
MCHC: 33.6
MCV: 87.3
MCV: 87.4
MCV: 87.7
Platelets: 189
Platelets: 240
RBC: 3.8 — ABNORMAL LOW
RDW: 17.1 — ABNORMAL HIGH
RDW: 17.6 — ABNORMAL HIGH
WBC: 10.7 — ABNORMAL HIGH
WBC: 10.8 — ABNORMAL HIGH
WBC: 11.7 — ABNORMAL HIGH
WBC: 9.8

## 2011-03-05 LAB — PREPARE FRESH FROZEN PLASMA

## 2011-03-05 LAB — URINALYSIS, ROUTINE W REFLEX MICROSCOPIC
Bilirubin Urine: NEGATIVE
Glucose, UA: NEGATIVE
Ketones, ur: 15 — AB
Nitrite: NEGATIVE
Protein, ur: NEGATIVE

## 2011-03-05 LAB — PROTIME-INR
INR: 1.2
INR: 1.3
INR: 1.9 — ABNORMAL HIGH
INR: 2.1 — ABNORMAL HIGH
INR: 3.6 — ABNORMAL HIGH
INR: 1.2
INR: 1.3
INR: 1.4
Prothrombin Time: 15.5 — ABNORMAL HIGH
Prothrombin Time: 15.9 — ABNORMAL HIGH
Prothrombin Time: 22.7 — ABNORMAL HIGH
Prothrombin Time: 22.8 — ABNORMAL HIGH
Prothrombin Time: 37.1 — ABNORMAL HIGH
Prothrombin Time: 16 — ABNORMAL HIGH
Prothrombin Time: 17.5 — ABNORMAL HIGH

## 2011-03-05 LAB — BASIC METABOLIC PANEL
BUN: 14
CO2: 26
Calcium: 8.3 — ABNORMAL LOW
Calcium: 9
Chloride: 108
Chloride: 106
Creatinine, Ser: 1.06
GFR calc Af Amer: >60
GFR calc Af Amer: >60
GFR calc Af Amer: >60
GFR calc non Af Amer: >60
GFR calc non Af Amer: >60
GFR calc non Af Amer: >60
Glucose, Bld: 95
Potassium: 4.2
Potassium: 4.7
Potassium: 4.4
Sodium: 135
Sodium: 137
Sodium: 139
Sodium: 138

## 2011-03-05 LAB — DIFFERENTIAL
Basophils Relative: 0
Lymphs Abs: 1.4
Monocytes Absolute: 1.4 — ABNORMAL HIGH
Monocytes Relative: 14 — ABNORMAL HIGH
Neutro Abs: 6.6

## 2011-03-05 LAB — COMPREHENSIVE METABOLIC PANEL
AST: 24
Albumin: 3.3 — ABNORMAL LOW
BUN: 15
Calcium: 9.2
Creatinine, Ser: 1.11
GFR calc Af Amer: >60

## 2011-03-05 LAB — POCT I-STAT, CHEM 8
Calcium, Ion: 1.16
HCT: 35 — ABNORMAL LOW
TCO2: 24

## 2011-03-05 LAB — MAGNESIUM: Magnesium: 2.1

## 2011-03-05 LAB — B-NATRIURETIC PEPTIDE (CONVERTED LAB)
Pro B Natriuretic peptide (BNP): 447 — ABNORMAL HIGH
Pro B Natriuretic peptide (BNP): 159 — ABNORMAL HIGH

## 2011-03-05 LAB — URINE CULTURE: Culture: NO GROWTH

## 2011-03-05 LAB — HEPATIC FUNCTION PANEL
ALT: 15
Alkaline Phosphatase: 67
Bilirubin, Direct: 0.2
Indirect Bilirubin: 0.6
Total Bilirubin: 0.8

## 2011-03-05 LAB — CULTURE, BLOOD (ROUTINE X 2)
Culture: NO GROWTH
Culture: NO GROWTH

## 2011-03-05 LAB — AMIODARONE LEVEL: Amiodarone Lvl: 0.92 ug/mL (ref 0.50–2.00)

## 2011-03-05 LAB — TSH: TSH: 0.793

## 2011-03-09 LAB — DIFFERENTIAL
Basophils Absolute: 0
Basophils Relative: 1
Eosinophils Relative: 2
Eosinophils Relative: 7 — ABNORMAL HIGH
Eosinophils Relative: 4
Lymphocytes Relative: 10 — ABNORMAL LOW
Lymphocytes Relative: 19
Lymphocytes Relative: 9 — ABNORMAL LOW
Lymphs Abs: 0.8
Monocytes Absolute: 1
Monocytes Absolute: 1
Monocytes Relative: 11
Monocytes Relative: 13 — ABNORMAL HIGH
Neutro Abs: 4.4
Neutro Abs: 7
Neutro Abs: 6.2

## 2011-03-09 LAB — CBC
HCT: 39
HCT: 31.1 — ABNORMAL LOW
HCT: 32.4 — ABNORMAL LOW
HCT: 33.4 — ABNORMAL LOW
HCT: 36.2 — ABNORMAL LOW
Hemoglobin: 12.6 — ABNORMAL LOW
Hemoglobin: 10.3 — ABNORMAL LOW
MCHC: 32.1
MCHC: 32.2
MCHC: 32.3
MCHC: 32.6
MCHC: 32.6
MCV: 87.1
MCV: 87.8
MCV: 87.9
MCV: 88.5
MCV: 84.7
MCV: 86.3
MCV: 86.4
Platelets: 167
Platelets: 208
Platelets: 211
Platelets: 212
Platelets: 217
Platelets: 179
Platelets: 183
Platelets: 184
RBC: 4.29
RBC: 4.42
RBC: 3.87 — ABNORMAL LOW
RDW: 17 — ABNORMAL HIGH
RDW: 16.1 — ABNORMAL HIGH
RDW: 16.2 — ABNORMAL HIGH
RDW: 16.3 — ABNORMAL HIGH
WBC: 7.4
WBC: 8
WBC: 8.5
WBC: 8.6
WBC: 12.3 — ABNORMAL HIGH
WBC: 15 — ABNORMAL HIGH
WBC: 8.8

## 2011-03-09 LAB — CULTURE, BLOOD (ROUTINE X 2)

## 2011-03-09 LAB — PROTIME-INR
INR: 1.2
INR: 1.3
INR: 1.3
INR: 1.7 — ABNORMAL HIGH
INR: 1.9 — ABNORMAL HIGH
INR: 2 — ABNORMAL HIGH
INR: 2.2 — ABNORMAL HIGH
Prothrombin Time: 16.4 — ABNORMAL HIGH
Prothrombin Time: 16.5 — ABNORMAL HIGH
Prothrombin Time: 20.8 — ABNORMAL HIGH
Prothrombin Time: 25.6 — ABNORMAL HIGH

## 2011-03-09 LAB — BASIC METABOLIC PANEL
BUN: 17
BUN: 17
BUN: 17
BUN: 17
BUN: 16
BUN: 26 — ABNORMAL HIGH
CO2: 24
CO2: 25
CO2: 27
CO2: 29
Calcium: 8.5
Calcium: 8.8
Calcium: 8.6
Calcium: 8.7
Chloride: 104
Chloride: 107
Chloride: 108
Chloride: 110
Chloride: 100
Chloride: 101
Creatinine, Ser: 1.13
Creatinine, Ser: 1.19
Creatinine, Ser: 1.23
Creatinine, Ser: 1.28
Creatinine, Ser: 1.29
Creatinine, Ser: 1.35
Creatinine, Ser: 1.7 — ABNORMAL HIGH
GFR calc Af Amer: >60
GFR calc Af Amer: >60
GFR calc Af Amer: >60
GFR calc Af Amer: >60
GFR calc non Af Amer: 55 — ABNORMAL LOW
GFR calc non Af Amer: >60
GFR calc non Af Amer: >60
GFR calc non Af Amer: 41 — ABNORMAL LOW
GFR calc non Af Amer: 54 — ABNORMAL LOW
GFR calc non Af Amer: 58 — ABNORMAL LOW
GFR calc non Af Amer: >60
Glucose, Bld: 102 — ABNORMAL HIGH
Glucose, Bld: 103 — ABNORMAL HIGH
Glucose, Bld: 115 — ABNORMAL HIGH
Glucose, Bld: 95
Potassium: 4
Potassium: 4.2
Potassium: 4.6
Potassium: 4.7
Potassium: 5.1
Sodium: 140
Sodium: 137
Sodium: 140

## 2011-03-09 LAB — CARDIAC PANEL(CRET KIN+CKTOT+MB+TROPI)
CK, MB: 2.7
CK, MB: 2.9
CK, MB: 0.8
Relative Index: 2.4
Relative Index: INVALID
Relative Index: INVALID
Total CK: 111
Troponin I: 0.03
Troponin I: 0.03
Troponin I: 0.07 — ABNORMAL HIGH
Troponin I: 0.09 — ABNORMAL HIGH

## 2011-03-09 LAB — CK TOTAL AND CKMB (NOT AT ARMC)
Relative Index: INVALID
Total CK: 137
Total CK: 52

## 2011-03-09 LAB — URINALYSIS, MICROSCOPIC ONLY
Leukocytes, UA: NEGATIVE
Protein, ur: NEGATIVE
Urobilinogen, UA: 0.2

## 2011-03-09 LAB — BLOOD GAS, ARTERIAL
Acid-Base Excess: 1.3
O2 Content: 2
pCO2 arterial: 38.6
pH, Arterial: 7.43
pO2, Arterial: 93.6

## 2011-03-09 LAB — COMPREHENSIVE METABOLIC PANEL
AST: 28
AST: 42 — ABNORMAL HIGH
Albumin: 3.3 — ABNORMAL LOW
Albumin: 3 — ABNORMAL LOW
BUN: 18
Calcium: 8.2 — ABNORMAL LOW
Chloride: 109
Creatinine, Ser: 1.14
Creatinine, Ser: 1.44
GFR calc Af Amer: >60
GFR calc Af Amer: >60
Potassium: 4
Total Bilirubin: 1.2
Total Bilirubin: 0.9

## 2011-03-09 LAB — B-NATRIURETIC PEPTIDE (CONVERTED LAB)
Pro B Natriuretic peptide (BNP): 104 — ABNORMAL HIGH
Pro B Natriuretic peptide (BNP): 81
Pro B Natriuretic peptide (BNP): 228 — ABNORMAL HIGH

## 2011-03-09 LAB — VANCOMYCIN, TROUGH: Vancomycin Tr: 6.4 — ABNORMAL LOW

## 2011-03-09 LAB — HEPARIN LEVEL (UNFRACTIONATED)
Heparin Unfractionated: 0.49
Heparin Unfractionated: 0.49
Heparin Unfractionated: 0.52
Heparin Unfractionated: 0.55
Heparin Unfractionated: 0.57
Heparin Unfractionated: 0.61
Heparin Unfractionated: 0.93 — ABNORMAL HIGH

## 2011-03-09 LAB — LIPID PANEL
LDL Cholesterol: 108 — ABNORMAL HIGH
Total CHOL/HDL Ratio: 4.2
VLDL: 10

## 2011-03-09 LAB — APTT: aPTT: 110 — ABNORMAL HIGH

## 2011-03-09 LAB — TROPONIN I: Troponin I: 0.04

## 2011-03-09 LAB — DIGOXIN LEVEL: Digoxin Level: 1.4

## 2011-03-09 LAB — TSH: TSH: 1.345

## 2011-03-09 LAB — VANCOMYCIN, RANDOM: Vancomycin Rm: 31.1

## 2011-03-17 LAB — BASIC METABOLIC PANEL
BUN: 20
CO2: 30
Calcium: 8.7
Calcium: 9.2
Creatinine, Ser: 1.3
GFR calc Af Amer: >60
GFR calc non Af Amer: 53 — ABNORMAL LOW
GFR calc non Af Amer: 57 — ABNORMAL LOW
Sodium: 139

## 2011-03-17 LAB — CBC
Hemoglobin: 11.7 — ABNORMAL LOW
Hemoglobin: 13.8
MCHC: 32.6
MCHC: 33.1
MCV: 86.6
Platelets: 154
Platelets: 204
RBC: 4.01 — ABNORMAL LOW
RDW: 14.3
WBC: 11.2 — ABNORMAL HIGH
WBC: 8.9

## 2011-03-17 LAB — CULTURE, BLOOD (ROUTINE X 2)
Culture: NO GROWTH
Culture: NO GROWTH

## 2011-03-17 LAB — ETHANOL: Alcohol, Ethyl (B): <5

## 2011-03-17 LAB — POCT I-STAT 3, ART BLOOD GAS (G3+)
Bicarbonate: 23.9
Operator id: 283371
Patient temperature: 98.6
TCO2: 25

## 2011-03-17 LAB — I-STAT 8, (EC8 V) (CONVERTED LAB)
BUN: 10
Bicarbonate: 25.1 — ABNORMAL HIGH
Glucose, Bld: 110 — ABNORMAL HIGH
Operator id: 196461
pCO2, Ven: 48.1

## 2011-03-17 LAB — PROTIME-INR
INR: 2.1 — ABNORMAL HIGH
INR: 2.3 — ABNORMAL HIGH
Prothrombin Time: 24.9 — ABNORMAL HIGH
Prothrombin Time: 25.8 — ABNORMAL HIGH

## 2011-03-17 LAB — DIFFERENTIAL
Basophils Absolute: 0
Basophils Relative: 0
Lymphocytes Relative: 6 — ABNORMAL LOW
Monocytes Absolute: 1
Neutro Abs: 13.7 — ABNORMAL HIGH
Neutrophils Relative %: 87 — ABNORMAL HIGH

## 2011-03-17 LAB — POCT CARDIAC MARKERS
CKMB, poc: 3
Operator id: 196461
Troponin i, poc: <0.05

## 2011-03-17 LAB — B-NATRIURETIC PEPTIDE (CONVERTED LAB): Pro B Natriuretic peptide (BNP): 413 — ABNORMAL HIGH

## 2011-03-17 LAB — POCT I-STAT CREATININE: Creatinine, Ser: 1.3

## 2011-03-17 LAB — D-DIMER, QUANTITATIVE: D-Dimer, Quant: 1.19 — ABNORMAL HIGH

## 2011-03-18 LAB — DIFFERENTIAL
Basophils Relative: 1
Eosinophils Absolute: 0.3
Eosinophils Relative: 3
Lymphs Abs: 1.3
Monocytes Relative: 13 — ABNORMAL HIGH
Neutrophils Relative %: 70

## 2011-03-18 LAB — URINALYSIS, ROUTINE W REFLEX MICROSCOPIC
Bilirubin Urine: NEGATIVE
Glucose, UA: NEGATIVE
Hgb urine dipstick: NEGATIVE
Ketones, ur: NEGATIVE
Nitrite: NEGATIVE
Protein, ur: NEGATIVE
Specific Gravity, Urine: 1.01
Urobilinogen, UA: 0.2
pH: 6

## 2011-03-18 LAB — CBC
HCT: 37.8 — ABNORMAL LOW
Hemoglobin: 12.8 — ABNORMAL LOW
MCHC: 33.8
MCV: 85.6
Platelets: 171
RBC: 4.42
RDW: 16.3 — ABNORMAL HIGH
WBC: 10

## 2011-03-18 LAB — COMPREHENSIVE METABOLIC PANEL
ALT: 36
AST: 36
Albumin: 3.5
Alkaline Phosphatase: 59
BUN: 24 — ABNORMAL HIGH
CO2: 27
Calcium: 8.9
Chloride: 103
Creatinine, Ser: 1.34
GFR calc Af Amer: >60
GFR calc non Af Amer: 55 — ABNORMAL LOW
Glucose, Bld: 113 — ABNORMAL HIGH
Potassium: 4.5
Sodium: 139
Total Bilirubin: 0.6
Total Protein: 6.6

## 2011-05-01 ENCOUNTER — Encounter: Payer: Medicare Other | Admitting: *Deleted

## 2011-05-01 IMAGING — CR DG CHEST 2V
2 series · 2 of 2 positions shown · non-contrast
Comparison: Chest x-ray 07/21/2010.

CLINICAL DATA: Dizziness.

CHEST - 2 VIEW

[w chest pa]
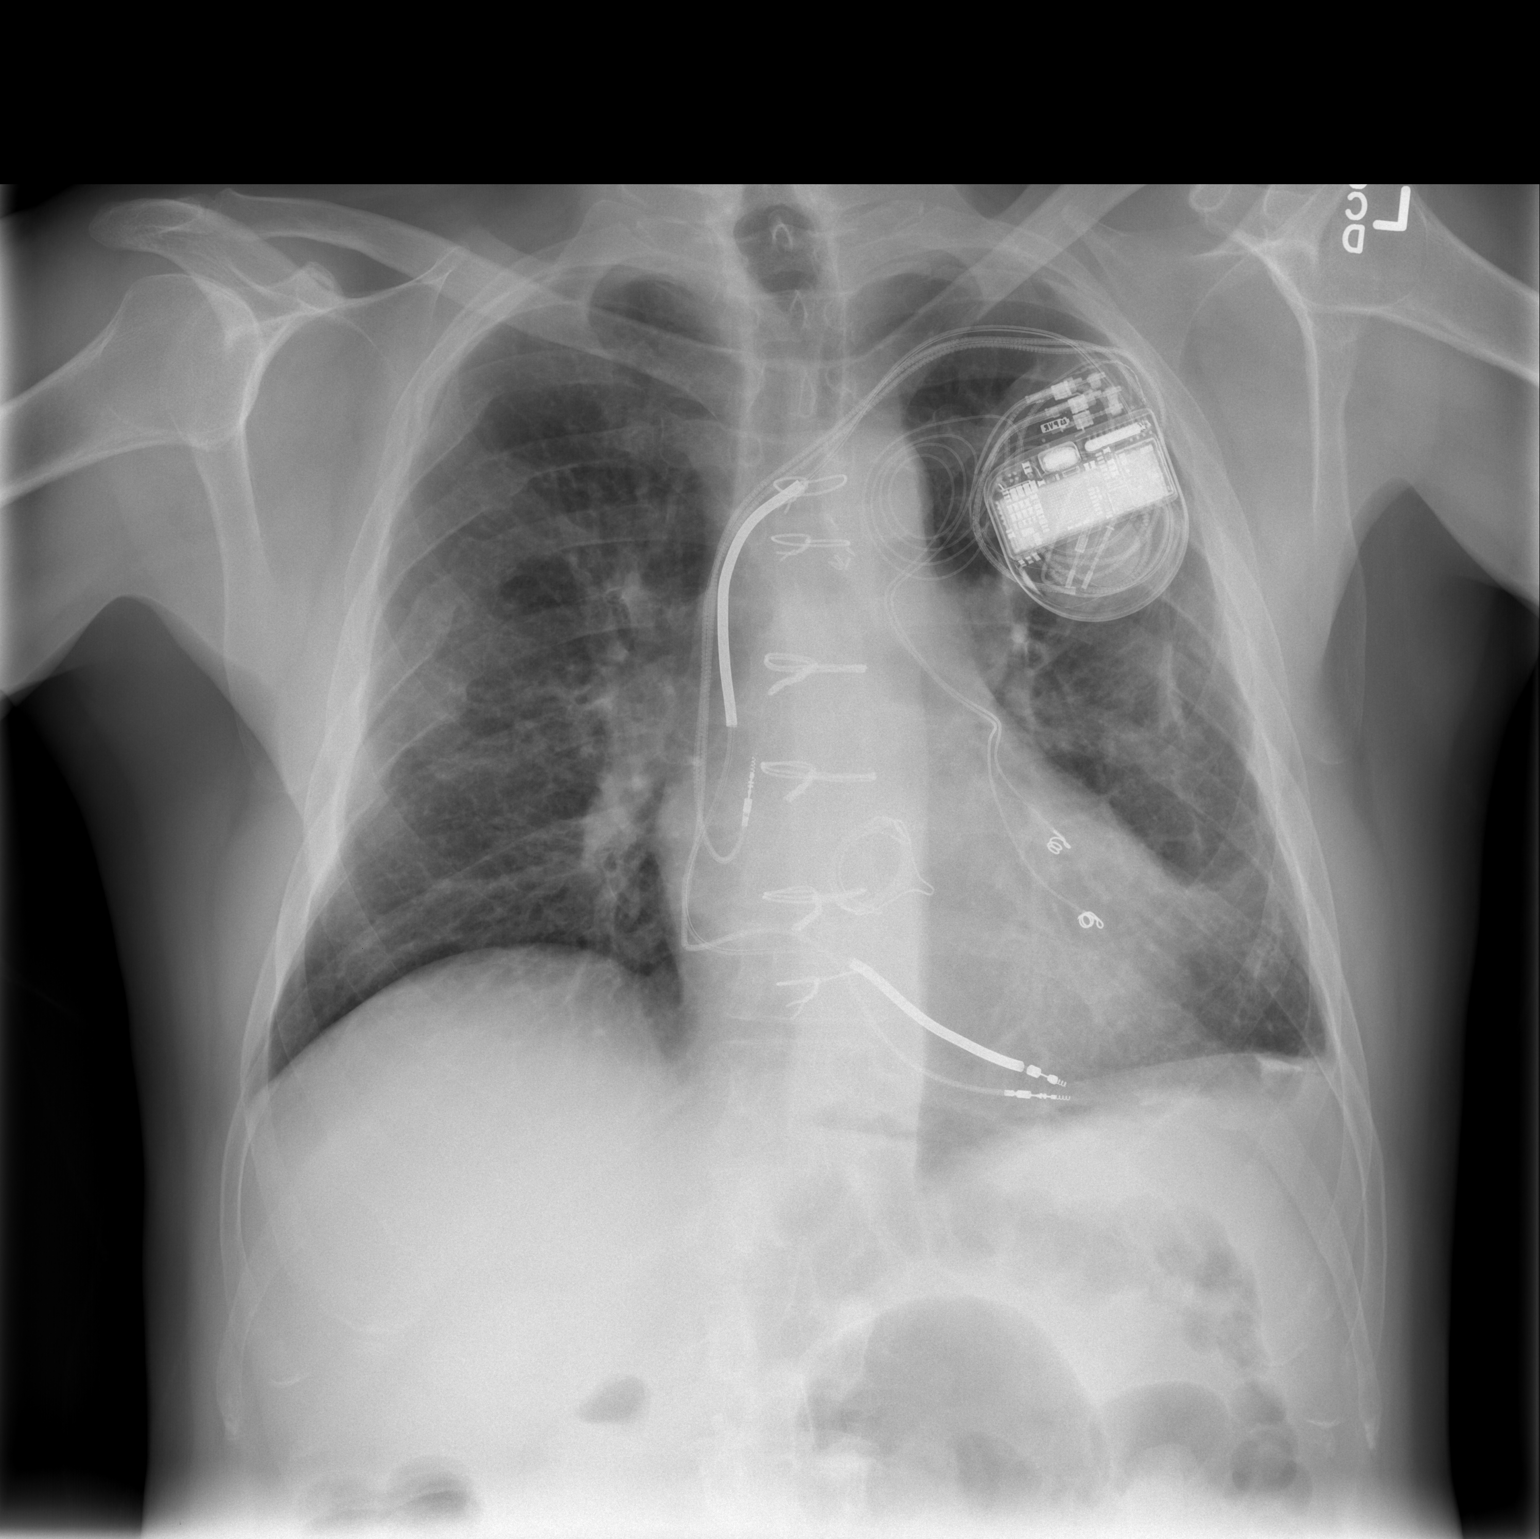

[w chest lat]
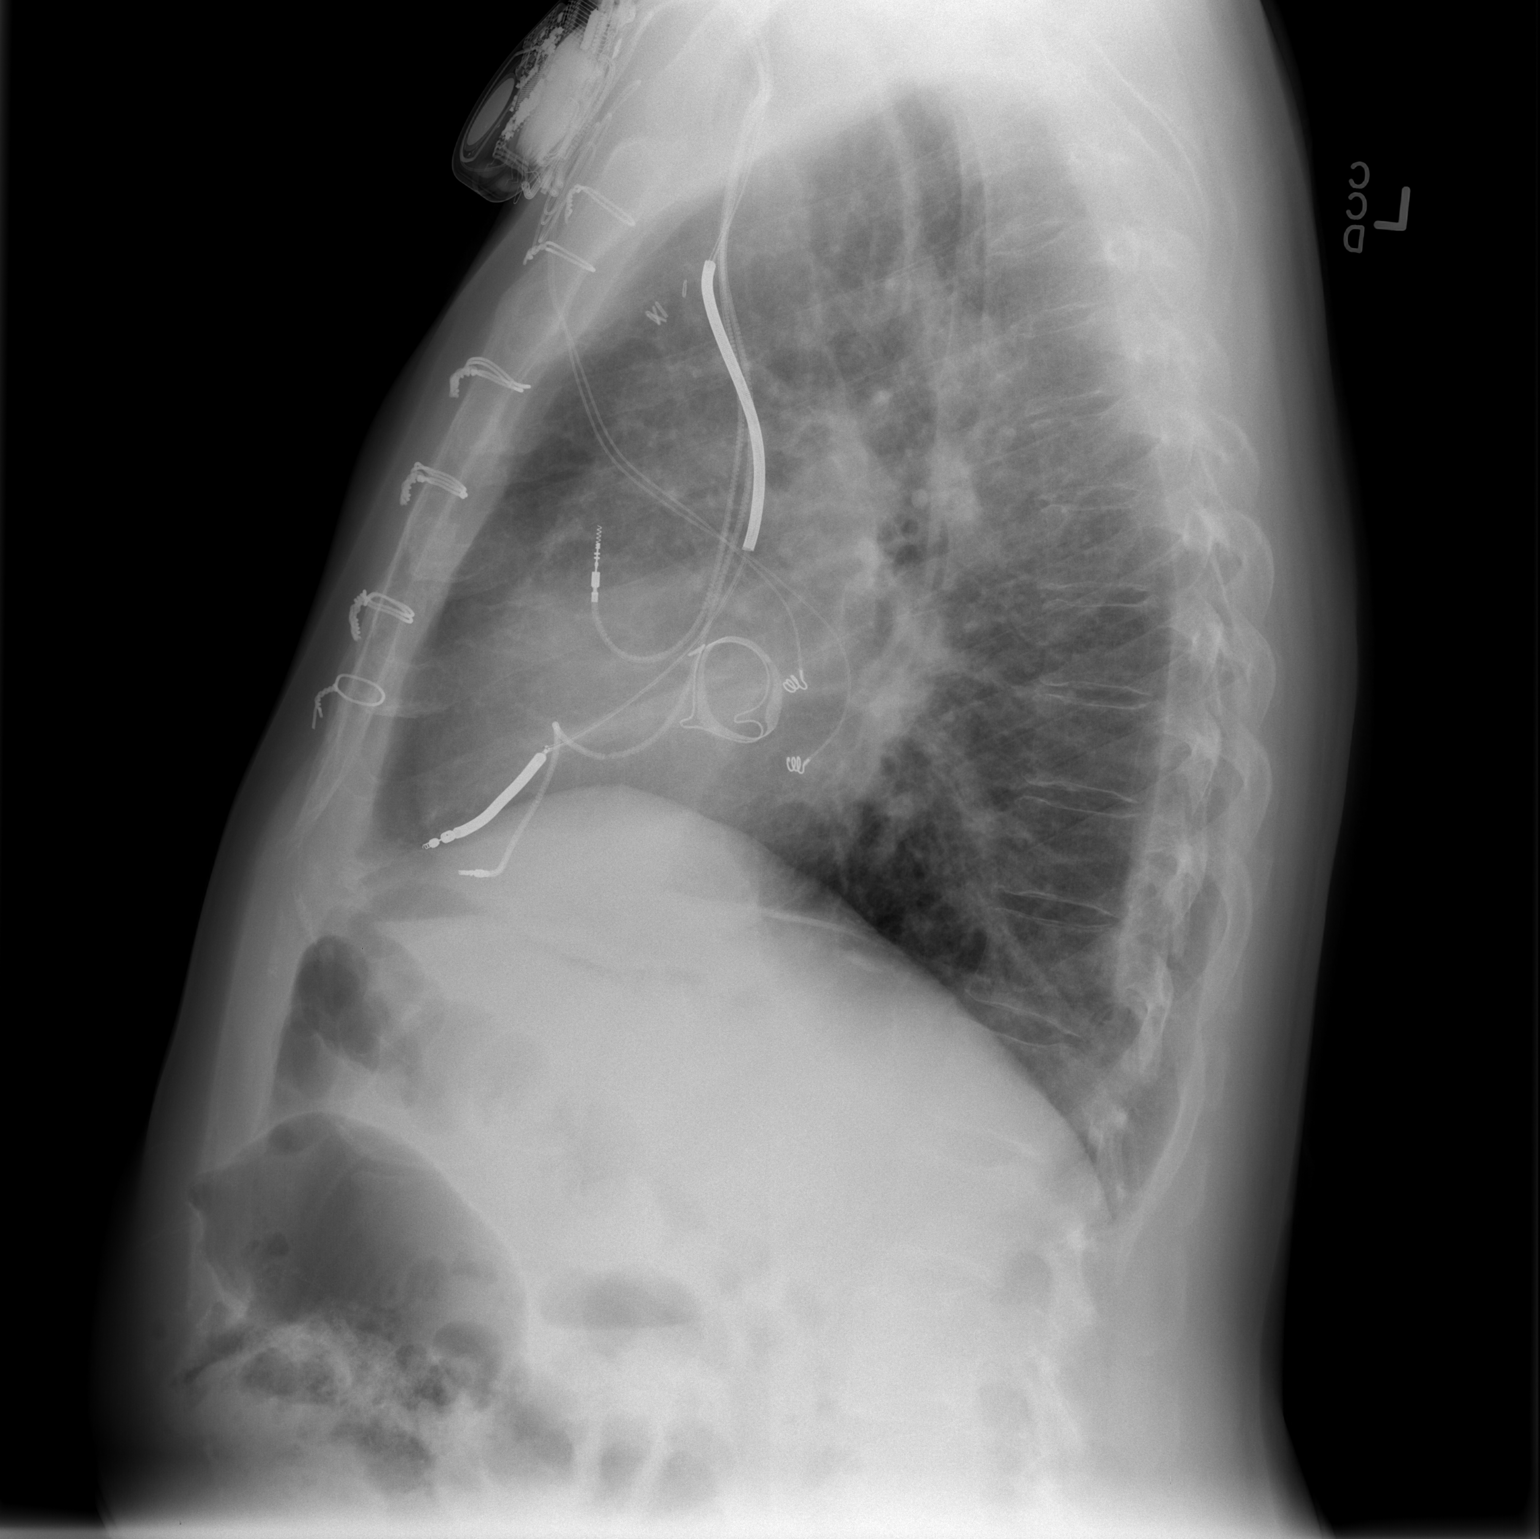

[2 of 2 positions shown; findings below may reference images not displayed]

FINDINGS: The cardiac silhouette, mediastinal and hilar contours
are within normal limits and stable.  Stable pacer wires / AICD.
Remote surgical changes from aortic valve replacement surgery.
There are chronic scarring changes in the left lung and remote
healed right rib fractures.  No acute pulmonary findings.  Pleural
calcifications noted on the left.
IMPRESSION: Chronic lung changes but no acute pulmonary findings.

## 2011-05-01 IMAGING — CT CT HEAD W/O CM
1 series · 16 of 30 positions shown, 20 images · non-contrast
Comparison: 03/18/2010.

CLINICAL DATA: Headache.  Dizziness.  Anxiety.  Previous stroke.

CT HEAD WITHOUT CONTRAST
TECHNIQUE: Contiguous axial images were obtained from the base of
the skull through the vertex without contrast.

[Series 2: head_seq 4.5 h37s st · axial · 0.43mm/px · z∈[-112,+14]mm · 16 of 32 slices shown, 20 images]
[im 2/32  brain]
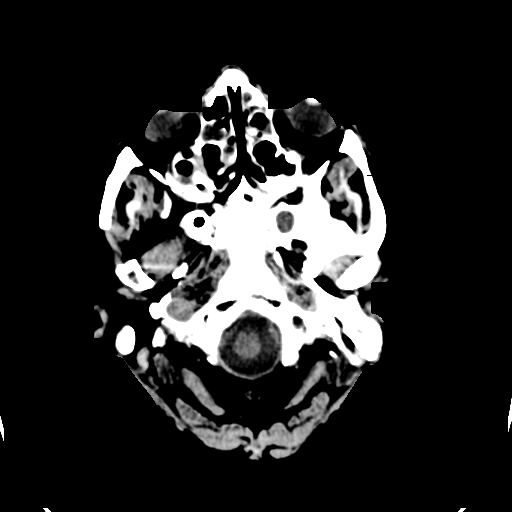
[im 2/32  bone]
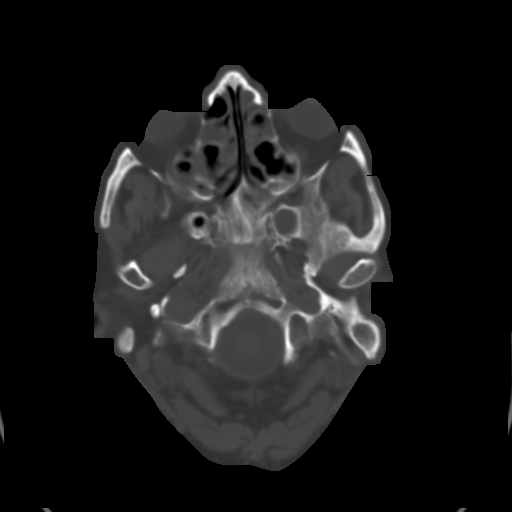
[im 4/32  brain]
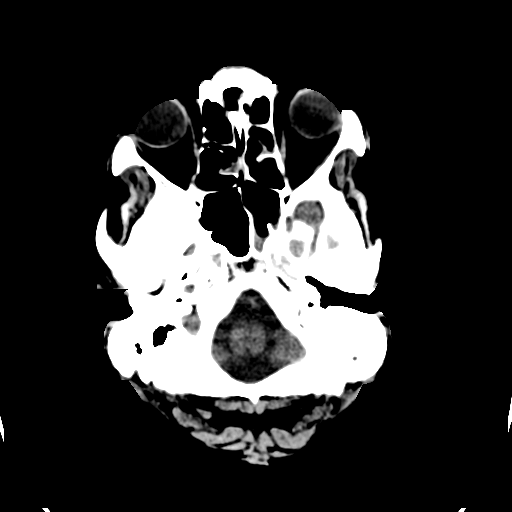
[im 6/32  brain]
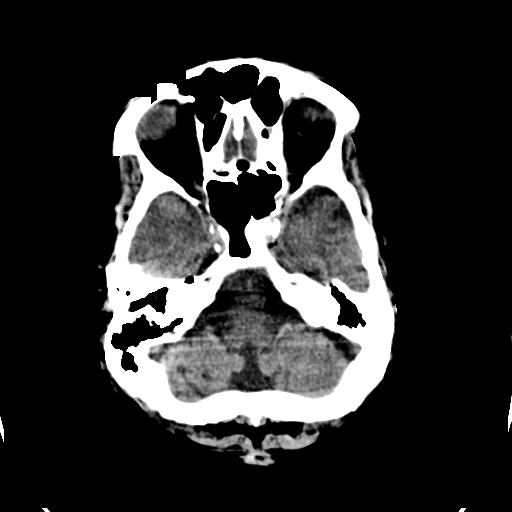
[im 8/32  brain]
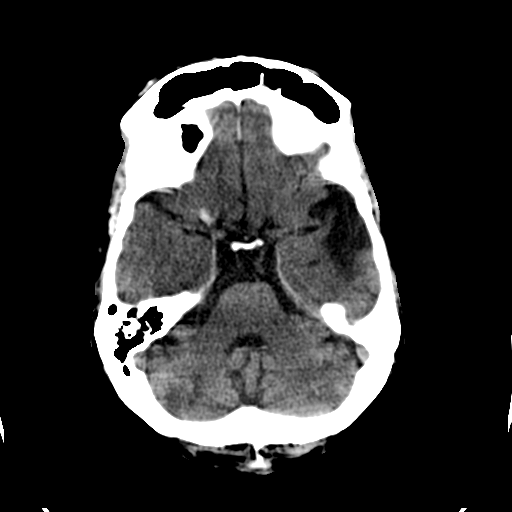
[im 9/32  brain]
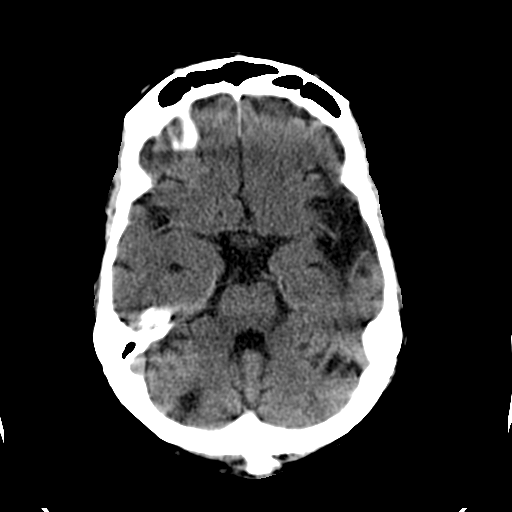
[im 9/32  bone]
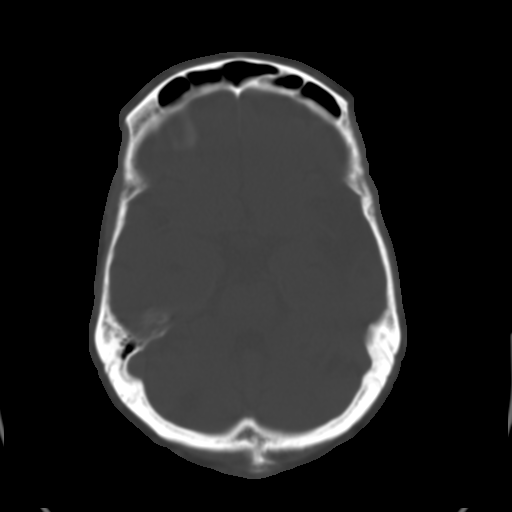
[im 11/32  brain]
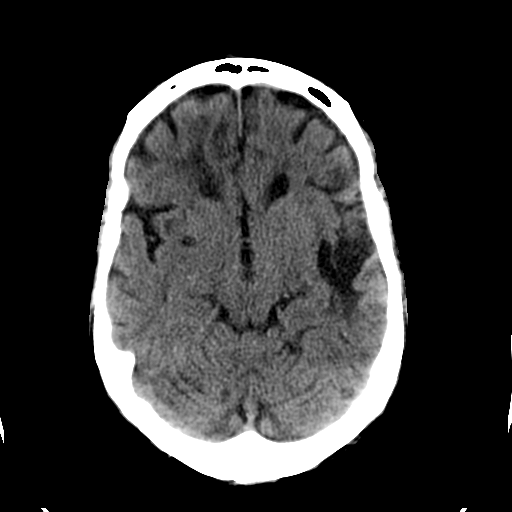
[im 13/32  brain]
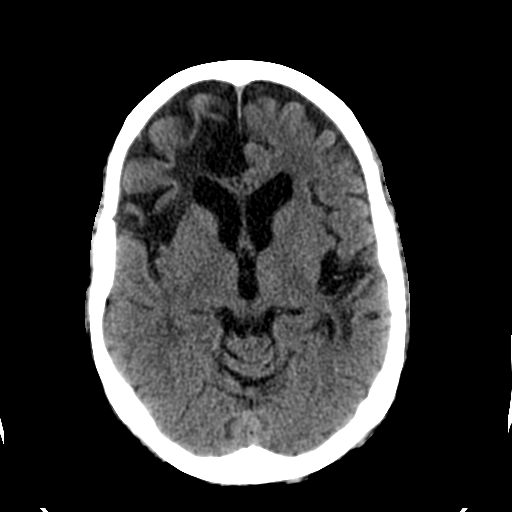
[im 15/32  brain]
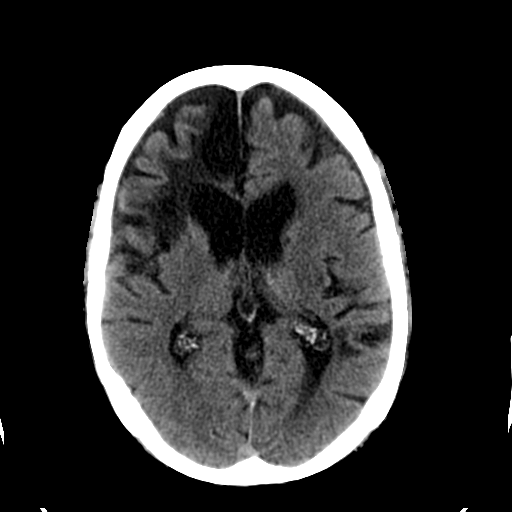
[im 17/32  brain]
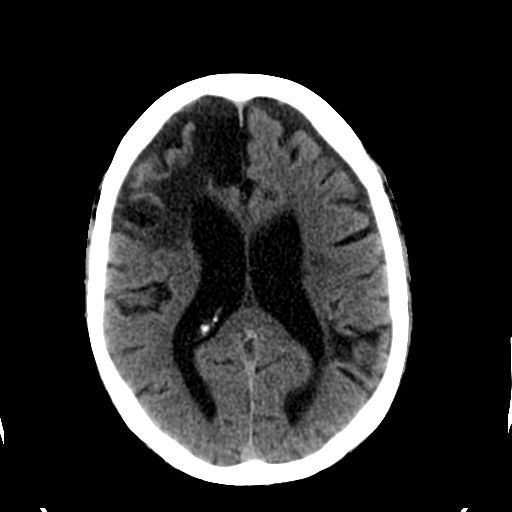
[im 17/32  bone]
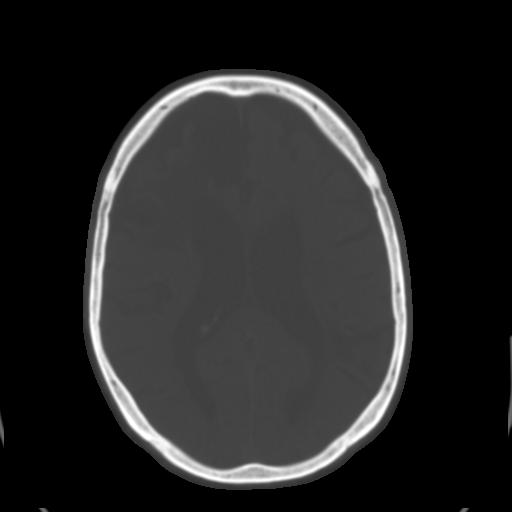
[im 19/32  brain]
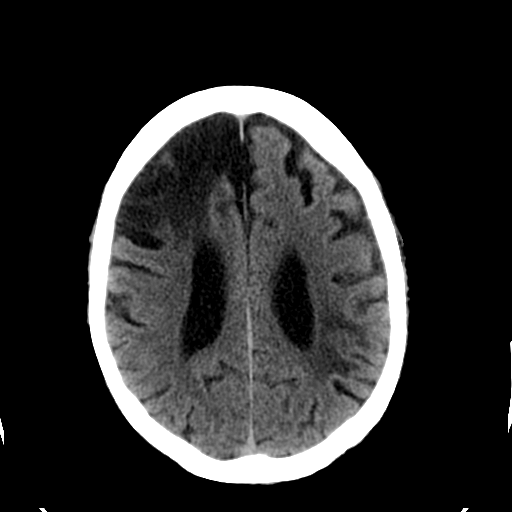
[im 21/32  brain]
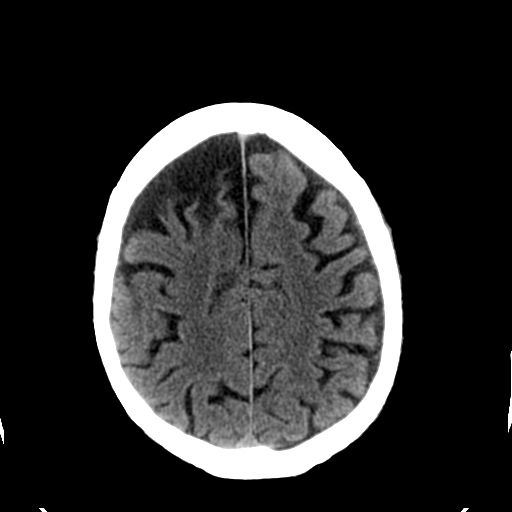
[im 23/32  brain]
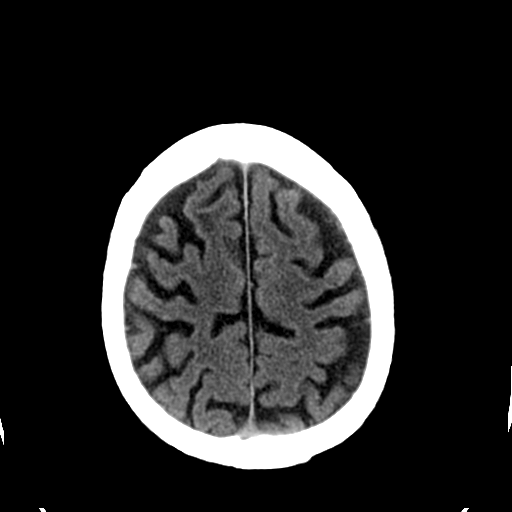
[im 24/32  brain]
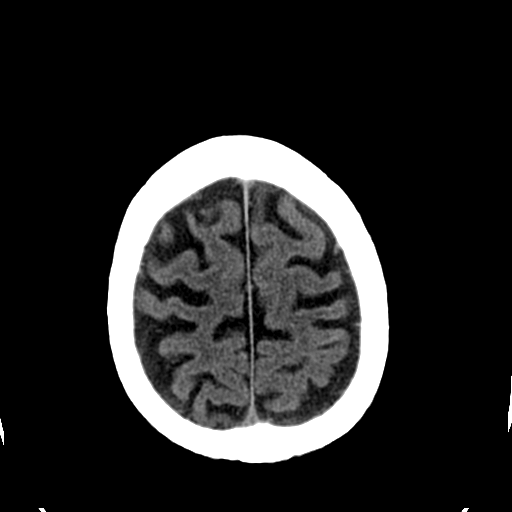
[im 24/32  bone]
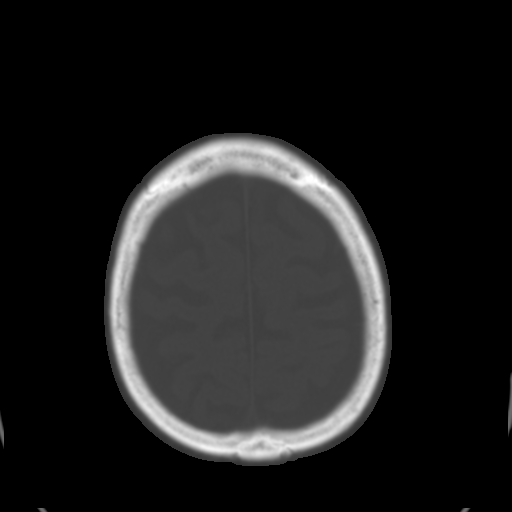
[im 26/32  brain]
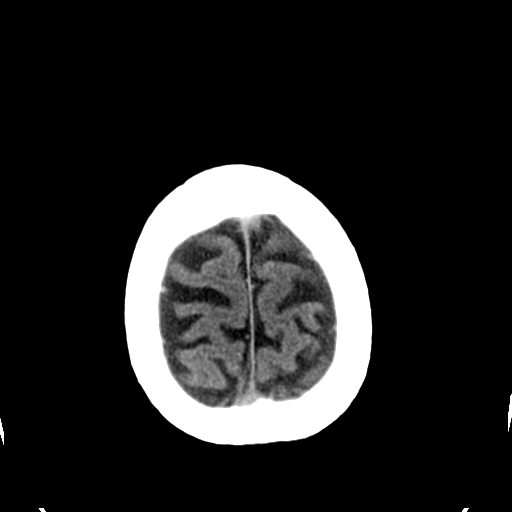
[im 28/32  brain]
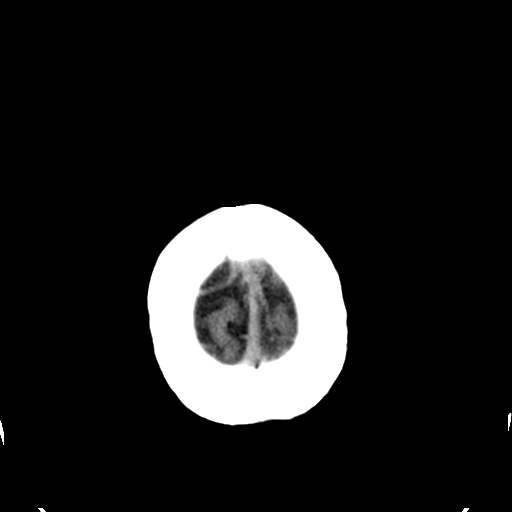
[im 30/32  brain]
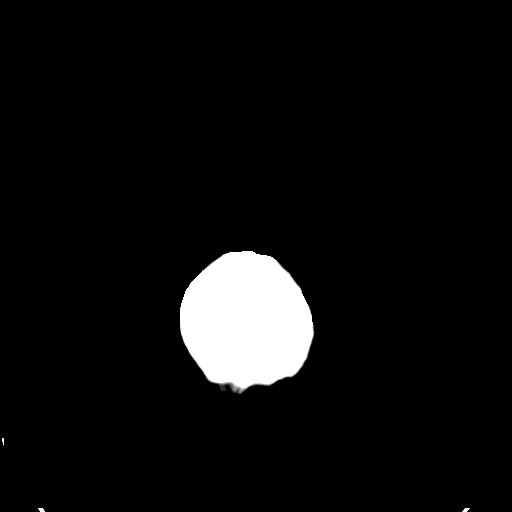

[16 of 30 positions shown; findings below may reference images not displayed]

FINDINGS: Stable enlarged ventricles and subarachnoid spaces.
Stable old right frontal and left posterior watershed infarcts.
Stable old right basal ganglia lacunar infarct.  Small, old left
caudate head lacunar infarct.  Mild patchy white matter low density
in both cerebral hemispheres without significant change.  No
intracranial hemorrhage, mass lesion or CT evidence of acute
infarction.  Interval extensive bilateral ethmoid, bilateral
sphenoid, bilateral frontal and bilateral maxillary sinus mucosal
thickening.  Possible fluid in the maxillary sinuses.
IMPRESSION: 1.  Interval extensive chronic pansinusitis with probable acute
sinusitis in the maxillary sinuses.
2.  No acute intracranial abnormality.
3.  Atrophy, old infarcts and chronic small vessel white matter
ischemic changes, as described above.

## 2011-05-02 ENCOUNTER — Encounter: Payer: Self-pay | Admitting: *Deleted

## 2011-07-11 ENCOUNTER — Inpatient Hospital Stay (HOSPITAL_COMMUNITY): Payer: Medicare Other

## 2011-07-11 ENCOUNTER — Inpatient Hospital Stay (HOSPITAL_COMMUNITY)
Admission: EM | Admit: 2011-07-11 | Discharge: 2011-07-21 | DRG: 963 | Disposition: A | Discharge status: Home Health | Payer: Medicare Other | Attending: General Surgery | Admitting: General Surgery

## 2011-07-11 ENCOUNTER — Emergency Department (HOSPITAL_COMMUNITY): Payer: Medicare Other

## 2011-07-11 ENCOUNTER — Encounter (HOSPITAL_COMMUNITY): Payer: Self-pay

## 2011-07-11 DIAGNOSIS — Z950 Presence of cardiac pacemaker: Secondary | ICD-10-CM

## 2011-07-11 DIAGNOSIS — F1010 Alcohol abuse, uncomplicated: Secondary | ICD-10-CM | POA: Diagnosis present

## 2011-07-11 DIAGNOSIS — Z7982 Long term (current) use of aspirin: Secondary | ICD-10-CM

## 2011-07-11 DIAGNOSIS — I482 Chronic atrial fibrillation, unspecified: Secondary | ICD-10-CM

## 2011-07-11 DIAGNOSIS — I472 Ventricular tachycardia, unspecified: Secondary | ICD-10-CM | POA: Diagnosis present

## 2011-07-11 DIAGNOSIS — S2249XA Multiple fractures of ribs, unspecified side, initial encounter for closed fracture: Secondary | ICD-10-CM | POA: Diagnosis present

## 2011-07-11 DIAGNOSIS — Z91199 Patient's noncompliance with other medical treatment and regimen due to unspecified reason: Secondary | ICD-10-CM

## 2011-07-11 DIAGNOSIS — J4489 Other specified chronic obstructive pulmonary disease: Secondary | ICD-10-CM | POA: Diagnosis present

## 2011-07-11 DIAGNOSIS — J449 Chronic obstructive pulmonary disease, unspecified: Secondary | ICD-10-CM | POA: Diagnosis present

## 2011-07-11 DIAGNOSIS — N35919 Unspecified urethral stricture, male, unspecified site: Secondary | ICD-10-CM

## 2011-07-11 DIAGNOSIS — Z87891 Personal history of nicotine dependence: Secondary | ICD-10-CM

## 2011-07-11 DIAGNOSIS — J9600 Acute respiratory failure, unspecified whether with hypoxia or hypercapnia: Secondary | ICD-10-CM | POA: Diagnosis not present

## 2011-07-11 DIAGNOSIS — I442 Atrioventricular block, complete: Secondary | ICD-10-CM

## 2011-07-11 DIAGNOSIS — D62 Acute posthemorrhagic anemia: Secondary | ICD-10-CM | POA: Diagnosis present

## 2011-07-11 DIAGNOSIS — I509 Heart failure, unspecified: Secondary | ICD-10-CM | POA: Diagnosis present

## 2011-07-11 DIAGNOSIS — S37009A Unspecified injury of unspecified kidney, initial encounter: Principal | ICD-10-CM | POA: Diagnosis present

## 2011-07-11 DIAGNOSIS — N179 Acute kidney failure, unspecified: Secondary | ICD-10-CM | POA: Diagnosis present

## 2011-07-11 DIAGNOSIS — S27329A Contusion of lung, unspecified, initial encounter: Secondary | ICD-10-CM | POA: Diagnosis present

## 2011-07-11 DIAGNOSIS — I5022 Chronic systolic (congestive) heart failure: Secondary | ICD-10-CM | POA: Diagnosis present

## 2011-07-11 DIAGNOSIS — Z9861 Coronary angioplasty status: Secondary | ICD-10-CM

## 2011-07-11 DIAGNOSIS — S2239XA Fracture of one rib, unspecified side, initial encounter for closed fracture: Secondary | ICD-10-CM | POA: Diagnosis present

## 2011-07-11 DIAGNOSIS — I2510 Atherosclerotic heart disease of native coronary artery without angina pectoris: Secondary | ICD-10-CM | POA: Diagnosis present

## 2011-07-11 DIAGNOSIS — I10 Essential (primary) hypertension: Secondary | ICD-10-CM | POA: Diagnosis present

## 2011-07-11 DIAGNOSIS — F319 Bipolar disorder, unspecified: Secondary | ICD-10-CM | POA: Diagnosis present

## 2011-07-11 DIAGNOSIS — IMO0002 Reserved for concepts with insufficient information to code with codable children: Secondary | ICD-10-CM | POA: Diagnosis present

## 2011-07-11 DIAGNOSIS — I238 Other current complications following acute myocardial infarction: Secondary | ICD-10-CM | POA: Diagnosis present

## 2011-07-11 DIAGNOSIS — Z954 Presence of other heart-valve replacement: Secondary | ICD-10-CM

## 2011-07-11 DIAGNOSIS — S060X0A Concussion without loss of consciousness, initial encounter: Secondary | ICD-10-CM | POA: Diagnosis present

## 2011-07-11 DIAGNOSIS — I4891 Unspecified atrial fibrillation: Secondary | ICD-10-CM | POA: Diagnosis present

## 2011-07-11 DIAGNOSIS — I513 Intracardiac thrombosis, not elsewhere classified: Secondary | ICD-10-CM | POA: Diagnosis present

## 2011-07-11 DIAGNOSIS — I4729 Other ventricular tachycardia: Secondary | ICD-10-CM | POA: Diagnosis present

## 2011-07-11 DIAGNOSIS — Z8673 Personal history of transient ischemic attack (TIA), and cerebral infarction without residual deficits: Secondary | ICD-10-CM

## 2011-07-11 DIAGNOSIS — Z7901 Long term (current) use of anticoagulants: Secondary | ICD-10-CM

## 2011-07-11 DIAGNOSIS — J982 Interstitial emphysema: Secondary | ICD-10-CM | POA: Diagnosis present

## 2011-07-11 DIAGNOSIS — Z9119 Patient's noncompliance with other medical treatment and regimen: Secondary | ICD-10-CM

## 2011-07-11 DIAGNOSIS — Z86718 Personal history of other venous thrombosis and embolism: Secondary | ICD-10-CM

## 2011-07-11 HISTORY — DX: Acute embolism and thrombosis of unspecified deep veins of unspecified lower extremity: I82.409

## 2011-07-11 LAB — PROTIME-INR
INR: 1.05 (ref 0.00–1.49)
Prothrombin Time: 13.9 seconds (ref 11.6–15.2)
Prothrombin Time: 14.9 seconds (ref 11.6–15.2)

## 2011-07-11 LAB — MRSA PCR SCREENING: MRSA by PCR: NEGATIVE

## 2011-07-11 LAB — CBC
HCT: 48.3 % (ref 39.0–52.0)
MCH: 30.5 pg (ref 26.0–34.0)
MCHC: 33.5 g/dL (ref 30.0–36.0)
RDW: 14.4 % (ref 11.5–15.5)

## 2011-07-11 LAB — TYPE AND SCREEN

## 2011-07-11 LAB — COMPREHENSIVE METABOLIC PANEL
Albumin: 4.1 g/dL (ref 3.5–5.2)
Alkaline Phosphatase: 63 U/L (ref 39–117)
BUN: 16 mg/dL (ref 6–23)
Calcium: 9.6 mg/dL (ref 8.4–10.5)
GFR calc Af Amer: 78 mL/min — ABNORMAL LOW (ref >90)
Potassium: 7.3 mEq/L (ref 3.5–5.1)
Total Protein: 8.8 g/dL — ABNORMAL HIGH (ref 6.0–8.3)

## 2011-07-11 LAB — POCT I-STAT, CHEM 8
BUN: 27 mg/dL — ABNORMAL HIGH (ref 6–23)
Creatinine, Ser: 1.1 mg/dL (ref 0.50–1.35)
Hemoglobin: 17.3 g/dL — ABNORMAL HIGH (ref 13.0–17.0)
Potassium: 7.5 mEq/L (ref 3.5–5.1)
Sodium: 140 mEq/L (ref 135–145)

## 2011-07-11 LAB — GLUCOSE, CAPILLARY: Glucose-Capillary: 190 mg/dL — ABNORMAL HIGH (ref 70–99)

## 2011-07-11 MED ORDER — NALOXONE HCL 0.4 MG/ML IJ SOLN
0.4000 mg | INTRAMUSCULAR | Status: DC | PRN
  Administered 2011-07-15: 0.2 mg via INTRAVENOUS
  Filled 2011-07-11: qty 1

## 2011-07-11 MED ORDER — ONDANSETRON HCL 4 MG/2ML IJ SOLN: 4.0000 mg | Freq: Four times a day (QID) | INTRAMUSCULAR | Status: DC | PRN

## 2011-07-11 MED ORDER — IOHEXOL 300 MG/ML  SOLN
100.0000 mL | Freq: Once | INTRAMUSCULAR | Status: AC | PRN
  Administered 2011-07-11: 100 mL via INTRAVENOUS

## 2011-07-11 MED ORDER — FENTANYL CITRATE 0.05 MG/ML IJ SOLN
INTRAMUSCULAR | Status: AC
  Administered 2011-07-11: 100 ug via INTRAVENOUS
  Filled 2011-07-11: qty 2

## 2011-07-11 MED ORDER — PANTOPRAZOLE SODIUM 40 MG IV SOLR
40.0000 mg | Freq: Every day | INTRAVENOUS | Status: DC
  Administered 2011-07-11 – 2011-07-16 (×6): 40 mg via INTRAVENOUS
  Filled 2011-07-11 (×7): qty 40

## 2011-07-11 MED ORDER — ANTIINHIBITOR COAGULANT CMPLX IV SOLR: 500.0000 [IU] | INTRAVENOUS | Status: DC

## 2011-07-11 MED ORDER — SODIUM CHLORIDE 0.9 % IJ SOLN: 9.0000 mL | INTRAMUSCULAR | Status: DC | PRN

## 2011-07-11 MED ORDER — KCL IN DEXTROSE-NACL 20-5-0.45 MEQ/L-%-% IV SOLN
INTRAVENOUS | Status: DC
  Administered 2011-07-11: 18:00:00 via INTRAVENOUS
  Filled 2011-07-11 (×5): qty 1000

## 2011-07-11 MED ORDER — PANTOPRAZOLE SODIUM 40 MG PO TBEC: 40.0000 mg | DELAYED_RELEASE_TABLET | Freq: Every day | ORAL | Status: DC

## 2011-07-11 MED ORDER — ANTIINHIBITOR COAGULANT CMPLX IV SOLR: 500.0000 [IU] | Freq: Once | INTRAVENOUS | Status: DC | PRN

## 2011-07-11 MED ORDER — VITAMIN K1 10 MG/ML IJ SOLN: 10.0000 mg | INTRAVENOUS | Status: DC

## 2011-07-11 MED ORDER — ONDANSETRON HCL 4 MG PO TABS
4.0000 mg | ORAL_TABLET | Freq: Four times a day (QID) | ORAL | Status: DC | PRN
  Filled 2011-07-11: qty 0.5

## 2011-07-11 MED ORDER — ONDANSETRON HCL 4 MG/2ML IJ SOLN
4.0000 mg | Freq: Four times a day (QID) | INTRAMUSCULAR | Status: DC | PRN
  Administered 2011-07-11: 4 mg via INTRAVENOUS

## 2011-07-11 MED ORDER — DIPHENHYDRAMINE HCL 12.5 MG/5ML PO ELIX
12.5000 mg | ORAL_SOLUTION | Freq: Four times a day (QID) | ORAL | Status: DC | PRN
  Filled 2011-07-11: qty 5

## 2011-07-11 MED ORDER — MORPHINE SULFATE (PF) 1 MG/ML IV SOLN
INTRAVENOUS | Status: DC
  Administered 2011-07-11: 18:00:00 via INTRAVENOUS
  Filled 2011-07-11: qty 25

## 2011-07-11 MED ORDER — FENTANYL CITRATE 0.05 MG/ML IJ SOLN
100.0000 ug | Freq: Once | INTRAMUSCULAR | Status: AC
  Administered 2011-07-11: 100 ug via INTRAVENOUS

## 2011-07-11 MED ORDER — SODIUM CHLORIDE 0.9 % IV SOLN
INTRAVENOUS | Status: DC
  Administered 2011-07-11: 18:00:00 via INTRAVENOUS

## 2011-07-11 MED ORDER — DIPHENHYDRAMINE HCL 50 MG/ML IJ SOLN: 12.5000 mg | Freq: Four times a day (QID) | INTRAMUSCULAR | Status: DC | PRN

## 2011-07-11 NOTE — ED Notes (Signed)
Attempted to contact patient's son at number given [phone] but unable to get outside. Pt made aware

## 2011-07-11 NOTE — ED Notes (Signed)
Pt taken to Xray with RN
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
## 2011-07-11 NOTE — Progress Notes (Signed)
echocardiogram*PRELIMINARY RESULTS* Echocardiogram 2D Echo has been performed.  Sherren Kerns Renee 07/11/2011, 7:17 PM
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
## 2011-07-11 NOTE — ED Notes (Signed)
Dr. Berneice Heinrich back at bedside to perform catheter insertion with cart from OR.
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
## 2011-07-11 NOTE — ED Notes (Signed)
Pharmacy contacted about the [facility] stat order.

## 2011-07-11 NOTE — ED Notes (Signed)
Pt to CT with RN
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
## 2011-07-11 NOTE — ED Notes (Signed)
Consent for FFP signed by patient
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
## 2011-07-11 NOTE — ED Notes (Signed)
Cardiologist at bedside at this time.

## 2011-07-11 NOTE — ED Notes (Signed)
Dr. Berneice Heinrich at bedside to attempt inserting a catheter

## 2011-07-12 ENCOUNTER — Encounter (HOSPITAL_COMMUNITY): Payer: Self-pay | Admitting: Anesthesiology

## 2011-07-12 ENCOUNTER — Encounter (HOSPITAL_COMMUNITY): Admission: EM | Disposition: A | Discharge status: Home Health | Payer: Self-pay | Source: Home / Self Care

## 2011-07-12 ENCOUNTER — Inpatient Hospital Stay (HOSPITAL_COMMUNITY): Payer: Medicare Other | Admitting: Anesthesiology

## 2011-07-12 ENCOUNTER — Inpatient Hospital Stay (HOSPITAL_COMMUNITY): Payer: Medicare Other

## 2011-07-12 DIAGNOSIS — I429 Cardiomyopathy, unspecified: Secondary | ICD-10-CM

## 2011-07-12 DIAGNOSIS — S060X9A Concussion with loss of consciousness of unspecified duration, initial encounter: Secondary | ICD-10-CM

## 2011-07-12 DIAGNOSIS — J95821 Acute postprocedural respiratory failure: Secondary | ICD-10-CM

## 2011-07-12 DIAGNOSIS — J9600 Acute respiratory failure, unspecified whether with hypoxia or hypercapnia: Secondary | ICD-10-CM

## 2011-07-12 DIAGNOSIS — N179 Acute kidney failure, unspecified: Secondary | ICD-10-CM

## 2011-07-12 HISTORY — PX: CYSTOSCOPY W/ URETERAL STENT PLACEMENT: SHX1429

## 2011-07-12 LAB — BASIC METABOLIC PANEL
BUN: 25 mg/dL — ABNORMAL HIGH (ref 6–23)
CO2: 24 mEq/L (ref 19–32)
Chloride: 108 mEq/L (ref 96–112)
Creatinine, Ser: 1.67 mg/dL — ABNORMAL HIGH (ref 0.50–1.35)

## 2011-07-12 LAB — CBC
HCT: 41.8 % (ref 39.0–52.0)
Hemoglobin: 11.2 g/dL — ABNORMAL LOW (ref 13.0–17.0)
MCH: 29.5 pg (ref 26.0–34.0)
MCV: 95 fL (ref 78.0–100.0)
Platelets: 99 10*3/uL — ABNORMAL LOW (ref 150–400)
RBC: 4.4 MIL/uL (ref 4.22–5.81)
RBC: 3.8 MIL/uL — ABNORMAL LOW (ref 4.22–5.81)
WBC: 15.8 10*3/uL — ABNORMAL HIGH (ref 4.0–10.5)
WBC: 9.6 10*3/uL (ref 4.0–10.5)

## 2011-07-12 LAB — POCT I-STAT 3, ART BLOOD GAS (G3+)
O2 Saturation: 100 %
pCO2 arterial: 36.5 mmHg (ref 35.0–45.0)
pH, Arterial: 7.433 (ref 7.350–7.450)

## 2011-07-12 LAB — PREPARE FRESH FROZEN PLASMA: Unit division: 0

## 2011-07-12 LAB — PROTIME-INR: Prothrombin Time: 15 seconds (ref 11.6–15.2)

## 2011-07-12 SURGERY — CYSTOSCOPY, WITH RETROGRADE PYELOGRAM AND URETERAL STENT INSERTION
Anesthesia: General | Site: Ureter | Laterality: Left | Wound class: Clean Contaminated

## 2011-07-12 MED ORDER — CHLORHEXIDINE GLUCONATE 0.12 % MT SOLN
15.0000 mL | Freq: Two times a day (BID) | OROMUCOSAL | Status: DC
  Administered 2011-07-12: 09:00:00 via OROMUCOSAL
  Administered 2011-07-12 – 2011-07-21 (×18): 15 mL via OROMUCOSAL
  Filled 2011-07-12 (×23): qty 15

## 2011-07-12 MED ORDER — MIDAZOLAM HCL 5 MG/5ML IJ SOLN
INTRAMUSCULAR | Status: DC | PRN
  Administered 2011-07-12 (×2): 1 mg via INTRAVENOUS

## 2011-07-12 MED ORDER — PROPOFOL 10 MG/ML IV EMUL
5.0000 ug/kg/min | INTRAVENOUS | Status: DC
  Administered 2011-07-12: 30 ug/kg/min via INTRAVENOUS
  Administered 2011-07-12: 30 mg via INTRAVENOUS
  Filled 2011-07-12: qty 100

## 2011-07-12 MED ORDER — PROPOFOL 10 MG/ML IV EMUL
5.0000 ug/kg/min | INTRAVENOUS | Status: DC
  Administered 2011-07-13: 29.556 ug/kg/min via INTRAVENOUS
  Administered 2011-07-13 – 2011-07-14 (×3): 40 ug/kg/min via INTRAVENOUS
  Administered 2011-07-14: 35 ug/kg/min via INTRAVENOUS
  Administered 2011-07-14: 40 ug/kg/min via INTRAVENOUS
  Filled 2011-07-12 (×8): qty 100

## 2011-07-12 MED ORDER — SODIUM CHLORIDE 0.9 % IV SOLN
INTRAVENOUS | Status: DC | PRN
  Administered 2011-07-12: 14:00:00 via INTRAVENOUS

## 2011-07-12 MED ORDER — BIOTENE DRY MOUTH MT LIQD
15.0000 mL | Freq: Two times a day (BID) | OROMUCOSAL | Status: DC
  Administered 2011-07-12 – 2011-07-21 (×12): 15 mL via OROMUCOSAL

## 2011-07-12 MED ORDER — FENTANYL CITRATE 0.05 MG/ML IJ SOLN
INTRAMUSCULAR | Status: DC | PRN
  Administered 2011-07-12 (×2): 50 ug via INTRAVENOUS

## 2011-07-12 MED ORDER — SODIUM CHLORIDE 0.9 % IV SOLN
10.0000 mg | INTRAVENOUS | Status: DC | PRN
  Administered 2011-07-12: 40 ug/min via INTRAVENOUS

## 2011-07-12 MED ORDER — PROPOFOL 10 MG/ML IV BOLUS
INTRAVENOUS | Status: DC | PRN
  Administered 2011-07-12: 70 mg via INTRAVENOUS

## 2011-07-12 MED ORDER — PROPOFOL 10 MG/ML IV EMUL
INTRAVENOUS | Status: AC
  Administered 2011-07-12: 30 mg via INTRAVENOUS
  Filled 2011-07-12: qty 100

## 2011-07-12 MED ORDER — PROPOFOL 10 MG/ML IV EMUL: 5.0000 ug/kg/min | INTRAVENOUS | Status: DC

## 2011-07-12 MED ORDER — IPRATROPIUM BROMIDE HFA 17 MCG/ACT IN AERS
2.0000 | INHALATION_SPRAY | RESPIRATORY_TRACT | Status: DC
  Administered 2011-07-12 – 2011-07-15 (×18): 2 via RESPIRATORY_TRACT
  Filled 2011-07-12: qty 12.9

## 2011-07-12 MED ORDER — MORPHINE SULFATE 4 MG/ML IJ SOLN
4.0000 mg | INTRAMUSCULAR | Status: DC | PRN
  Administered 2011-07-12 – 2011-07-13 (×2): 4 mg via INTRAVENOUS
  Filled 2011-07-12 (×2): qty 1

## 2011-07-12 MED ORDER — PROPOFOL 10 MG/ML IV EMUL
INTRAVENOUS | Status: DC | PRN
  Administered 2011-07-12 (×2): 30 ug/kg/min via INTRAVENOUS

## 2011-07-12 MED ORDER — ALBUTEROL SULFATE HFA 108 (90 BASE) MCG/ACT IN AERS
4.0000 | INHALATION_SPRAY | RESPIRATORY_TRACT | Status: DC
  Administered 2011-07-12 – 2011-07-15 (×17): 4 via RESPIRATORY_TRACT
  Filled 2011-07-12: qty 6.7

## 2011-07-12 MED ORDER — SUCCINYLCHOLINE CHLORIDE 20 MG/ML IJ SOLN
INTRAMUSCULAR | Status: DC | PRN
  Administered 2011-07-12: 80 mg via INTRAVENOUS

## 2011-07-12 MED ORDER — IOHEXOL 300 MG/ML  SOLN
INTRAMUSCULAR | Status: DC | PRN
  Administered 2011-07-12: 7 mL via INTRAVENOUS

## 2011-07-12 MED ORDER — CHLORHEXIDINE GLUCONATE 0.12 % MT SOLN
OROMUCOSAL | Status: AC
  Filled 2011-07-12: qty 15

## 2011-07-12 SURGICAL SUPPLY — 17 items
ADAPTER CATH URET PLST 4-6FR (CATHETERS) ×2 IMPLANT
BAG URIMETER BARDEX IC 350 (UROLOGICAL SUPPLIES) ×2 IMPLANT
BAG URO CATCHER STRL LF (DRAPE) ×2 IMPLANT
CANISTER OMNI JUG 16 LITER (MISCELLANEOUS) ×2 IMPLANT
CATH FOLEY 2WAY SLVR  5CC 20FR (CATHETERS) ×1
CATH FOLEY 2WAY SLVR 5CC 20FR (CATHETERS) ×1 IMPLANT
CATH URETERAL 5FR GU1803 105 (CATHETERS) ×2 IMPLANT
DRAPE CAMERA VIDEO/LASER (DRAPES) ×2 IMPLANT
GLOVE BIO SURGEON STRL SZ 6.5 (GLOVE) ×2 IMPLANT
GLOVE BIO SURGEON STRL SZ7.5 (GLOVE) ×2 IMPLANT
GOWN BRE IMP SLV AUR LG STRL (GOWN DISPOSABLE) ×4 IMPLANT
GUIDEWIRE ANGLED .035X150CM (WIRE) ×2 IMPLANT
KIT BASIN OR (CUSTOM PROCEDURE TRAY) ×2 IMPLANT
PACK CYSTOSCOPY (CUSTOM PROCEDURE TRAY) ×2 IMPLANT
STENT CONTOUR URETERAL (STENTS) ×2 IMPLANT
TOWEL OR 17X26 10 PK STRL BLUE (TOWEL DISPOSABLE) ×2 IMPLANT
WATER STERILE IRR 1000ML UROMA (IV SOLUTION) ×2 IMPLANT
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
## 2011-07-12 NOTE — Anesthesia Postprocedure Evaluation (Signed)
  Anesthesia Post-op Note  Patient: [name]  Procedure(s) Performed:  CYSTOSCOPY WITH RETROGRADE PYELOGRAM/URETERAL STENT PLACEMENT  Patient Location: ICU  Anesthesia Type: General  Level of Consciousness: sedated  Airway and Oxygen Therapy: Patient remains intubated per anesthesia plan and Patient placed on Ventilator (see vital sign flow sheet for setting)  Post-op Pain: pt sedated; no S/S of pain  Post-op Assessment: Post-op Vital signs reviewed and Patient's Cardiovascular Status Stable  Post-op Vital Signs: Reviewed and stable  Complications: No apparent anesthesia complications
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
## 2011-07-12 NOTE — Anesthesia Postprocedure Evaluation (Signed)
  Anesthesia Post-op Note  Patient: [name]  Procedure(s) Performed:  CYSTOSCOPY WITH RETROGRADE PYELOGRAM/URETERAL STENT PLACEMENT  Patient Location: SICU  Anesthesia Type: General  Level of Consciousness: sedated  Airway and Oxygen Therapy: Patient remains intubated per anesthesia plan  Post-op Pain: none  Post-op Assessment: Post-op Vital signs reviewed  Post-op Vital Signs: stable  Complications: No apparent anesthesia complications
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
## 2011-07-12 NOTE — Transfer of Care (Signed)
Immediate Anesthesia Transfer of Care Note  Patient: [name]  Procedure(s) Performed:  CYSTOSCOPY WITH RETROGRADE PYELOGRAM/URETERAL STENT PLACEMENT  Patient Location: MICU  Anesthesia Type: General  Level of Consciousness: sedated  Airway & Oxygen Therapy: Patient remains intubated per anesthesia plan and Patient placed on Ventilator (see vital sign flow sheet for setting)  Post-op Assessment: Report given to PACU RN and Post -op Vital signs reviewed and stable  Post vital signs: Reviewed and stable  Complications: No apparent anesthesia complications
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
## 2011-07-13 ENCOUNTER — Encounter (HOSPITAL_COMMUNITY): Payer: Self-pay | Admitting: Urology

## 2011-07-13 ENCOUNTER — Inpatient Hospital Stay (HOSPITAL_COMMUNITY): Payer: Medicare Other

## 2011-07-13 DIAGNOSIS — I482 Chronic atrial fibrillation, unspecified: Secondary | ICD-10-CM

## 2011-07-13 DIAGNOSIS — J9600 Acute respiratory failure, unspecified whether with hypoxia or hypercapnia: Secondary | ICD-10-CM

## 2011-07-13 DIAGNOSIS — N179 Acute kidney failure, unspecified: Secondary | ICD-10-CM

## 2011-07-13 DIAGNOSIS — I429 Cardiomyopathy, unspecified: Secondary | ICD-10-CM

## 2011-07-13 LAB — BLOOD GAS, ARTERIAL
Drawn by: 347621
MECHVT: 530 mL
O2 Saturation: 98.1 %
PEEP: 5 cmH2O
Patient temperature: 98.6
RATE: 18 resp/min
pH, Arterial: 7.311 — ABNORMAL LOW (ref 7.350–7.450)

## 2011-07-13 LAB — BASIC METABOLIC PANEL
BUN: 24 mg/dL — ABNORMAL HIGH (ref 6–23)
CO2: 23 mEq/L (ref 19–32)
GFR calc Af Amer: 60 mL/min — ABNORMAL LOW (ref >90)
Sodium: 138 mEq/L (ref 135–145)

## 2011-07-13 LAB — CBC
HCT: 37 % — ABNORMAL LOW (ref 39.0–52.0)
Hemoglobin: 11.7 g/dL — ABNORMAL LOW (ref 13.0–17.0)
Hemoglobin: 12 g/dL — ABNORMAL LOW (ref 13.0–17.0)
MCH: 29.4 pg (ref 26.0–34.0)
MCH: 29.4 pg (ref 26.0–34.0)
MCHC: 30.9 g/dL (ref 30.0–36.0)
MCV: 94.4 fL (ref 78.0–100.0)
MCV: 95.2 fL (ref 78.0–100.0)
MCV: 95.3 fL (ref 78.0–100.0)
Platelets: 115 10*3/uL — ABNORMAL LOW (ref 150–400)
RBC: 4.15 MIL/uL — ABNORMAL LOW (ref 4.22–5.81)
RBC: 4.08 MIL/uL — ABNORMAL LOW (ref 4.22–5.81)
RDW: 14.8 % (ref 11.5–15.5)
WBC: 10.6 10*3/uL — ABNORMAL HIGH (ref 4.0–10.5)
WBC: 13.2 10*3/uL — ABNORMAL HIGH (ref 4.0–10.5)

## 2011-07-13 LAB — GLUCOSE, CAPILLARY
Glucose-Capillary: 123 mg/dL — ABNORMAL HIGH (ref 70–99)
Glucose-Capillary: 109 mg/dL — ABNORMAL HIGH (ref 70–99)

## 2011-07-13 MED ORDER — HYDROMORPHONE HCL PF 1 MG/ML IJ SOLN
0.5000 mg | INTRAMUSCULAR | Status: DC | PRN
  Administered 2011-07-13 – 2011-07-15 (×4): 1 mg via INTRAVENOUS
  Administered 2011-07-16: 0.5 mg via INTRAVENOUS
  Administered 2011-07-16 – 2011-07-17 (×2): 1 mg via INTRAVENOUS
  Filled 2011-07-13 (×9): qty 1

## 2011-07-13 MED ORDER — SODIUM CHLORIDE 0.9 % IV SOLN
INTRAVENOUS | Status: DC
  Administered 2011-07-13: 75 mL/h via INTRAVENOUS
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
## 2011-07-13 NOTE — Progress Notes (Signed)
Clinical Social Worker completed the psychosocial assessment upon arrival to emergency room, which can be found in the shadow chart.  Patient remains intubated and no family present at this time.    No SBIRT completed at this time due to patient current medical status.   Clinical Social Worker will remain involved for emotional support and to facilitate discharge needs as appropriate.  [address] [place], [place] [phone]
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
## 2011-07-14 ENCOUNTER — Inpatient Hospital Stay (HOSPITAL_COMMUNITY): Payer: Medicare Other

## 2011-07-14 DIAGNOSIS — I4891 Unspecified atrial fibrillation: Secondary | ICD-10-CM

## 2011-07-14 DIAGNOSIS — I472 Ventricular tachycardia, unspecified: Secondary | ICD-10-CM

## 2011-07-14 DIAGNOSIS — Z8673 Personal history of transient ischemic attack (TIA), and cerebral infarction without residual deficits: Secondary | ICD-10-CM

## 2011-07-14 DIAGNOSIS — I442 Atrioventricular block, complete: Secondary | ICD-10-CM

## 2011-07-14 DIAGNOSIS — I5189 Other ill-defined heart diseases: Secondary | ICD-10-CM

## 2011-07-14 LAB — CBC
HCT: 35.9 % — ABNORMAL LOW (ref 39.0–52.0)
HCT: 37.7 % — ABNORMAL LOW (ref 39.0–52.0)
HCT: 33.4 % — ABNORMAL LOW (ref 39.0–52.0)
Hemoglobin: 11.3 g/dL — ABNORMAL LOW (ref 13.0–17.0)
Hemoglobin: 11.8 g/dL — ABNORMAL LOW (ref 13.0–17.0)
MCH: 29.4 pg (ref 26.0–34.0)
MCH: 29.4 pg (ref 26.0–34.0)
MCH: 29.9 pg (ref 26.0–34.0)
MCHC: 31.3 g/dL (ref 30.0–36.0)
MCHC: 32 g/dL (ref 30.0–36.0)
MCV: 93.3 fL (ref 78.0–100.0)
RBC: 3.84 MIL/uL — ABNORMAL LOW (ref 4.22–5.81)
RDW: 14.5 % (ref 11.5–15.5)

## 2011-07-14 LAB — DIFFERENTIAL
Eosinophils Absolute: 0.2 10*3/uL (ref 0.0–0.7)
Lymphs Abs: 0.7 10*3/uL (ref 0.7–4.0)
Monocytes Absolute: 1.7 10*3/uL — ABNORMAL HIGH (ref 0.1–1.0)
Monocytes Relative: 12 % (ref 3–12)
Neutro Abs: 11.2 10*3/uL — ABNORMAL HIGH (ref 1.7–7.7)
Neutrophils Relative %: 81 % — ABNORMAL HIGH (ref 43–77)

## 2011-07-14 LAB — BASIC METABOLIC PANEL
BUN: 19 mg/dL (ref 6–23)
Chloride: 110 mEq/L (ref 96–112)
Glucose, Bld: 109 mg/dL — ABNORMAL HIGH (ref 70–99)
Potassium: 4.4 mEq/L (ref 3.5–5.1)

## 2011-07-14 LAB — GLUCOSE, CAPILLARY: Glucose-Capillary: 103 mg/dL — ABNORMAL HIGH (ref 70–99)

## 2011-07-14 MED ORDER — PIPERACILLIN-TAZOBACTAM 3.375 G IVPB
3.3750 g | Freq: Three times a day (TID) | INTRAVENOUS | Status: DC
  Administered 2011-07-14 – 2011-07-20 (×18): 3.375 g via INTRAVENOUS
  Filled 2011-07-14 (×22): qty 50

## 2011-07-14 MED ORDER — PIVOT 1.5 CAL PO LIQD
1000.0000 mL | ORAL | Status: DC
  Administered 2011-07-14: 1000 mL
  Filled 2011-07-14 (×4): qty 1000

## 2011-07-14 MED ORDER — SODIUM CHLORIDE 0.45 % IV SOLN
INTRAVENOUS | Status: DC
  Administered 2011-07-14 – 2011-07-15 (×2): via INTRAVENOUS

## 2011-07-14 MED ORDER — OXEPA PO LIQD
1000.0000 mL | ORAL | Status: DC
  Filled 2011-07-14 (×2): qty 1000

## 2011-07-14 MED ORDER — ADULT MULTIVITAMIN LIQUID CH
5.0000 mL | Freq: Every day | ORAL | Status: DC
  Administered 2011-07-14 – 2011-07-16 (×2): 5 mL
  Filled 2011-07-14 (×4): qty 5

## 2011-07-14 MED ORDER — PRO-STAT SUGAR FREE PO LIQD
30.0000 mL | Freq: Two times a day (BID) | ORAL | Status: DC
  Administered 2011-07-14 (×2): 30 mL
  Filled 2011-07-14 (×6): qty 30
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
## 2011-07-14 NOTE — Progress Notes (Signed)
Subjective: The patient is still on vent.  Objective: Vital signs in last 24 hours: Temp:  [98.1 F (36.7 C)-101 F (38.3 C)] 99 F (37.2 C) (02/05 0419) Pulse Rate:  [58-62] 59  (02/05 0700) Resp:  [15-29] 18  (02/05 0700) BP: (107-186)/(38-101) 137/59 mmHg (02/05 0700) SpO2:  [94 %-100 %] 98 % (02/05 0700) FiO2 (%):  [39.8 %-40.2 %] 40.1 % (02/05 0700)  Intake/Output from previous day: 02/04 0701 - 02/05 0700 In: 1925.2 [I.V.:1925.2] Out: 695 [Urine:695] Intake/Output this shift:    Physical Exam:  Urine amber, w/o clots  Lab Results:  Basename 07/14/11 0040 07/13/11 1554 07/13/11 0855  HGB 11.3* 12.0* 12.2*  HCT 35.9* 38.9* 39.5    Assessment/Plan: Renal hematoma w/ ureteral  Obstruction, s/p stenting. Creat  Normal  Will followup later in hospitalization. I will be out of town @ meeting until 2/11. If Urology needed before then, call [phone].   Bertram Millard. Noah Pelaez, MD  07/14/2011, 7:54 AM
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
## 2011-07-15 ENCOUNTER — Inpatient Hospital Stay (HOSPITAL_COMMUNITY): Payer: Medicare Other

## 2011-07-15 LAB — CBC
HCT: 36.3 % — ABNORMAL LOW (ref 39.0–52.0)
Hemoglobin: 10.5 g/dL — ABNORMAL LOW (ref 13.0–17.0)
Hemoglobin: 10.7 g/dL — ABNORMAL LOW (ref 13.0–17.0)
MCH: 29.4 pg (ref 26.0–34.0)
MCH: 29.6 pg (ref 26.0–34.0)
MCH: 29.7 pg (ref 26.0–34.0)
MCHC: 31.4 g/dL (ref 30.0–36.0)
MCHC: 31.8 g/dL (ref 30.0–36.0)
MCV: 93.7 fL (ref 78.0–100.0)
MCV: 93.8 fL (ref 78.0–100.0)
Platelets: 113 10*3/uL — ABNORMAL LOW (ref 150–400)
Platelets: 122 10*3/uL — ABNORMAL LOW (ref 150–400)
RBC: 3.64 MIL/uL — ABNORMAL LOW (ref 4.22–5.81)
RBC: 3.7 MIL/uL — ABNORMAL LOW (ref 4.22–5.81)
RDW: 14.4 % (ref 11.5–15.5)
RDW: 14.5 % (ref 11.5–15.5)
RDW: 14.6 % (ref 11.5–15.5)

## 2011-07-15 LAB — BASIC METABOLIC PANEL
CO2: 24 mEq/L (ref 19–32)
Calcium: 9 mg/dL (ref 8.4–10.5)
Creatinine, Ser: 0.95 mg/dL (ref 0.50–1.35)
Glucose, Bld: 140 mg/dL — ABNORMAL HIGH (ref 70–99)

## 2011-07-15 LAB — CULTURE, RESPIRATORY W GRAM STAIN

## 2011-07-15 LAB — GLUCOSE, CAPILLARY
Glucose-Capillary: 128 mg/dL — ABNORMAL HIGH (ref 70–99)
Glucose-Capillary: 100 mg/dL — ABNORMAL HIGH (ref 70–99)

## 2011-07-15 MED ORDER — IPRATROPIUM BROMIDE 0.02 % IN SOLN
0.5000 mg | RESPIRATORY_TRACT | Status: DC
  Administered 2011-07-15 – 2011-07-18 (×21): 0.5 mg via RESPIRATORY_TRACT
  Filled 2011-07-15 (×20): qty 2.5

## 2011-07-15 MED ORDER — ALBUTEROL SULFATE (5 MG/ML) 0.5% IN NEBU
2.5000 mg | INHALATION_SOLUTION | RESPIRATORY_TRACT | Status: DC
  Administered 2011-07-15 – 2011-07-18 (×21): 2.5 mg via RESPIRATORY_TRACT
  Filled 2011-07-15 (×20): qty 0.5

## 2011-07-15 MED ORDER — IPRATROPIUM BROMIDE 0.02 % IN SOLN
RESPIRATORY_TRACT | Status: AC
  Administered 2011-07-15: 0.5 mg via RESPIRATORY_TRACT
  Filled 2011-07-15: qty 2.5

## 2011-07-15 MED ORDER — ALBUTEROL SULFATE (5 MG/ML) 0.5% IN NEBU
INHALATION_SOLUTION | RESPIRATORY_TRACT | Status: AC
  Administered 2011-07-15: 2.5 mg via RESPIRATORY_TRACT
  Filled 2011-07-15: qty 0.5

## 2011-07-15 MED ORDER — MIDAZOLAM HCL 2 MG/2ML IJ SOLN
INTRAMUSCULAR | Status: AC
  Filled 2011-07-15: qty 4

## 2011-07-15 MED ORDER — FENTANYL CITRATE 0.05 MG/ML IJ SOLN
INTRAMUSCULAR | Status: AC
  Filled 2011-07-15: qty 2

## 2011-07-15 NOTE — Procedures (Signed)
Extubation Procedure Note  Patient Details:   Name: Dominic Arguijo DOB: 1945-12-17 MRN: [id number]   Airway Documentation:  Airway 7.5 mm (Active)  Secured at (cm) 24 cm 07/15/2011  8:57 AM  Measured From Lips 07/15/2011  8:57 AM  Secured Location Center 07/15/2011  8:57 AM  Secured By [employer] 07/15/2011  8:57 AM  Tube Holder Repositioned Yes 07/15/2011  8:57 AM  Cuff Pressure (cm H2O) 24 cm H2O 07/15/2011  8:57 AM  Site Condition Dry 07/15/2011  8:57 AM    Evaluation  O2 sats: stable throughout Complications: No apparent complications Patient did tolerate procedure well. BBS rhonchi Suctioning: Airway Yes   Dorethea Clan 07/15/2011, 11:07 AM

## 2011-07-15 NOTE — Progress Notes (Signed)
Pt. Is on bipap using the servo I with a full face mask.

## 2011-07-15 NOTE — Progress Notes (Signed)
Follow up - Trauma and Critical Care  Patient Details:    [name] is an 66 y.o. male.  Lines/tubes : Airway 7.5 mm (Active)  Secured at (cm) 23 cm 07/15/2011  7:47 AM  Measured From Lips 07/15/2011  7:47 AM  Secured Location Left 07/15/2011  7:47 AM  Secured By [employer] 07/15/2011  7:47 AM  Tube Holder Repositioned Yes 07/15/2011  7:47 AM  Cuff Pressure (cm H2O) 26 cm H2O 07/15/2011 12:03 AM  Site Condition Dry 07/15/2011  7:47 AM     NG/OG Tube Orogastric 14 Fr. Left mouth (Active)  Placement Verification Auscultation 07/15/2011  7:47 AM  Site Assessment Clean;Dry;Intact 07/15/2011  7:47 AM  Status Clamped 07/15/2011  7:47 AM  Drainage Appearance None 07/15/2011  7:47 AM  Gastric Residual 0 mL 07/15/2011  7:47 AM  Intake (mL) 30 mL 07/15/2011  8:00 AM     Urethral Catheter Latex 20 Fr. (Active)  Site Assessment Clean;Intact;Dry 07/15/2011  7:47 AM  Collection Container Standard drainage bag 07/15/2011  7:47 AM  Securement Method Leg strap 07/15/2011  7:47 AM  Output (mL) 50 mL 07/15/2011  8:00 AM    Microbiology/Sepsis markers: Results for orders placed during the hospital encounter of 07/11/11  MRSA PCR SCREENING     Status: Normal   Collection Time   07/11/11  5:22 PM      Component Value Range Status Comment   MRSA by PCR NEGATIVE  NEGATIVE  Final   CULTURE, RESPIRATORY     Status: Normal (Preliminary result)   Collection Time   07/13/11 11:33 AM      Component Value Range Status Comment   Specimen Description TRACHEAL ASPIRATE   Final    Special Requests NONE   Final    Gram Stain     Final    Value: FEW WBC PRESENT,BOTH PMN AND MONONUCLEAR     FEW SQUAMOUS EPITHELIAL CELLS PRESENT     RARE GRAM POSITIVE COCCI     IN PAIRS FEW GRAM POSITIVE RODS     RARE GRAM NEGATIVE RODS   Culture Culture reincubated for better growth   Final    Report Status PENDING   Incomplete     Anti-infectives:  Anti-infectives     Start     Dose/Rate Route Frequency Ordered Stop   07/14/11 1130   piperacillin-tazobactam (ZOSYN) IVPB 3.375 g       3.375 g 12.5 mL/hr over 240 Minutes Intravenous 3 times per day 07/14/11 1049            Best Practice/Protocols:  VTE Prophylaxis: Mechanical GI Prophylaxis: Proton Pump Inhibitor Intermittent Sedation Continous Sedation continuous sedation has been stopped for potential extubation  Consults: Treatment Team:  Sebastian Ache, MD Marcine Matar, MD Quintella Reichert, MD    Events:  Subjective:    Overnight Issues: No new issues overnight.  Objective:  Vital signs for last 24 hours: Temp:  [97.3 F (36.3 C)-99.8 F (37.7 C)] 98.6 F (37 C) (02/06 0827) Pulse Rate:  [57-63] 60  (02/06 0857) Resp:  [16-24] 16  (02/06 0857) BP: (133-175)/(40-82) 157/82 mmHg (02/06 0800) SpO2:  [97 %-99 %] 98 % (02/06 0857) FiO2 (%):  [39.9 %-40.4 %] 40.2 % (02/06 0857) Weight:  [80.1 kg (176 lb 9.4 oz)] 80.1 kg (176 lb 9.4 oz) (02/06 0500)  Hemodynamic parameters for last 24 hours:    Intake/Output from previous day: 02/05 0701 - 02/06 0700 In: 1859.7 [I.V.:1174.7; NG/GT:535; IV Piggyback:150] Out: 1125 [  Urine:1125]  Intake/Output this shift: Total I/O In: 92.4 [I.V.:62.4; NG/GT:30] Out: 50 [Urine:50]  Vent settings for last 24 hours: Vent Mode:  [-] PRVC FiO2 (%):  [39.9 %-40.4 %] 40.2 % Set Rate:  [18 bmp] 18 bmp Vt Set:  [530 mL] 530 mL PEEP:  [4.6 cmH20-5 cmH20] 5 cmH20 Plateau Pressure:  [22 cmH20-28 cmH20] 24 cmH20  Physical Exam:  General: arousable and will follow commands Neuro: alert, nonfocal exam and RASS -1 Resp: diminished breath sounds bibasilar and bilaterally GI: soft, nontender, BS WNL, no r/g and tube feedings are off for potential extubatiion. Extremities: no edema, no erythema, pulses WNL  Results for orders placed during the hospital encounter of 07/11/11 (from the past 24 hour(s))  CBC     Status: Abnormal   Collection Time   07/14/11  9:49 AM      Component Value Range   WBC 13.1 (*) 4.0 -  10.5 (K/uL)   RBC 4.02 (*) 4.22 - 5.81 (MIL/uL)   Hemoglobin 11.8 (*) 13.0 - 17.0 (g/dL)   HCT 52.8 (*) 41.3 - 52.0 (%)   MCV 93.8  78.0 - 100.0 (fL)   MCH 29.4  26.0 - 34.0 (pg)   MCHC 31.3  30.0 - 36.0 (g/dL)   RDW 24.4  01.0 - 27.2 (%)   Platelets 105 (*) 150 - 400 (K/uL)  GLUCOSE, CAPILLARY     Status: Abnormal   Collection Time   07/14/11 12:02 PM      Component Value Range   Glucose-Capillary 117 (*) 70 - 99 (mg/dL)  CBC     Status: Abnormal   Collection Time   07/14/11  3:58 PM      Component Value Range   WBC 10.9 (*) 4.0 - 10.5 (K/uL)   RBC 3.58 (*) 4.22 - 5.81 (MIL/uL)   Hemoglobin 10.7 (*) 13.0 - 17.0 (g/dL)   HCT 53.6 (*) 64.4 - 52.0 (%)   MCV 93.3  78.0 - 100.0 (fL)   MCH 29.9  26.0 - 34.0 (pg)   MCHC 32.0  30.0 - 36.0 (g/dL)   RDW 03.4  74.2 - 59.5 (%)   Platelets 112 (*) 150 - 400 (K/uL)  CBC     Status: Abnormal   Collection Time   07/14/11 11:48 PM      Component Value Range   WBC 10.4  4.0 - 10.5 (K/uL)   RBC 3.55 (*) 4.22 - 5.81 (MIL/uL)   Hemoglobin 10.5 (*) 13.0 - 17.0 (g/dL)   HCT 63.8 (*) 75.6 - 52.0 (%)   MCV 93.0  78.0 - 100.0 (fL)   MCH 29.6  26.0 - 34.0 (pg)   MCHC 31.8  30.0 - 36.0 (g/dL)   RDW 43.3  29.5 - 18.8 (%)   Platelets 113 (*) 150 - 400 (K/uL)  GLUCOSE, CAPILLARY     Status: Abnormal   Collection Time   07/15/11 12:01 AM      Component Value Range   Glucose-Capillary 133 (*) 70 - 99 (mg/dL)  TRIGLYCERIDES     Status: Normal   Collection Time   07/15/11  4:10 AM      Component Value Range   Triglycerides 107  <150 (mg/dL)  CBC     Status: Abnormal   Collection Time   07/15/11  4:10 AM      Component Value Range   WBC 8.7  4.0 - 10.5 (K/uL)   RBC 3.64 (*) 4.22 - 5.81 (MIL/uL)   Hemoglobin 10.7 (*) 13.0 -  17.0 (g/dL)   HCT 40.9 (*) 81.1 - 52.0 (%)   MCV 93.7  78.0 - 100.0 (fL)   MCH 29.4  26.0 - 34.0 (pg)   MCHC 31.4  30.0 - 36.0 (g/dL)   RDW 91.4  78.2 - 95.6 (%)   Platelets 113 (*) 150 - 400 (K/uL)  BASIC METABOLIC PANEL      Status: Abnormal   Collection Time   07/15/11  4:10 AM      Component Value Range   Sodium 143  135 - 145 (mEq/L)   Potassium 4.2  3.5 - 5.1 (mEq/L)   Chloride 112  96 - 112 (mEq/L)   CO2 24  19 - 32 (mEq/L)   Glucose, Bld 140 (*) 70 - 99 (mg/dL)   BUN 20  6 - 23 (mg/dL)   Creatinine, Ser 2.13  0.50 - 1.35 (mg/dL)   Calcium 9.0  8.4 - 08.6 (mg/dL)   GFR calc non Af Amer 86 (*) >90 (mL/min)   GFR calc Af Amer >90  >90 (mL/min)  GLUCOSE, CAPILLARY     Status: Abnormal   Collection Time   07/15/11  5:44 AM      Component Value Range   Glucose-Capillary 128 (*) 70 - 99 (mg/dL)     Assessment/Plan:   NEURO  Altered Mental Status:  sedation   Plan: Wean off after the patient has been extubated  PULM  Improving function, weanable   Plan: Extubate  CARDIO  No issues   Plan: No changes  RENAL  No issues   Plan: No changes  GI  Was tolerating tube feedngs well prior to weaning to extubate.  Will try for swallowing evaluation after extubation.   Plan: No changes  ID  Pneumonia (community-acquired possible.  culture is pending.  On empiric antibiotics)   Plan: CPM  HEME  Anemia acute blood loss anemia and anemia of critical illness)   Plan: No plans for transfusion  ENDO No issues   Plan: CPM  Global Issues  Will attempt to extubate today.  If successful, may need BiPaP to support.  Swallowing evaluation. CPM otherwise.    LOS: 4 days   Additional comments:I reviewed the patient's new clinical lab test results. CBC/Bmet and I reviewed the patients new imaging test results. CXR  Critical Care Total Time*: 30 Minutes  Nashonda Limberg III,Jeniffer Culliver O 07/15/2011  *Care during the described time interval was provided by me and/or other providers on the critical care team.  I have reviewed this patient's available data, including medical history, events of note, physical examination and test results as part of my evaluation.
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
## 2011-07-15 NOTE — Progress Notes (Signed)
UR of chart completed.  

## 2011-07-16 LAB — CBC
HCT: 33.7 % — ABNORMAL LOW (ref 39.0–52.0)
HCT: 33.8 % — ABNORMAL LOW (ref 39.0–52.0)
HCT: 34.3 % — ABNORMAL LOW (ref 39.0–52.0)
Hemoglobin: 10.8 g/dL — ABNORMAL LOW (ref 13.0–17.0)
Hemoglobin: 11.4 g/dL — ABNORMAL LOW (ref 13.0–17.0)
MCH: 29.1 pg (ref 26.0–34.0)
MCH: 29.7 pg (ref 26.0–34.0)
MCHC: 30.7 g/dL (ref 30.0–36.0)
MCHC: 31.7 g/dL (ref 30.0–36.0)
MCHC: 31.8 g/dL (ref 30.0–36.0)
MCV: 93.9 fL (ref 78.0–100.0)
MCV: 94.1 fL (ref 78.0–100.0)
MCV: 94.5 fL (ref 78.0–100.0)
MCV: 94.6 fL (ref 78.0–100.0)
RBC: 3.63 MIL/uL — ABNORMAL LOW (ref 4.22–5.81)
RDW: 14.2 % (ref 11.5–15.5)
RDW: 14.2 % (ref 11.5–15.5)

## 2011-07-16 LAB — DIFFERENTIAL
Basophils Absolute: 0 10*3/uL (ref 0.0–0.1)
Basophils Relative: 0 % (ref 0–1)
Eosinophils Relative: 4 % (ref 0–5)
Monocytes Absolute: 1.1 10*3/uL — ABNORMAL HIGH (ref 0.1–1.0)
Neutro Abs: 7.1 10*3/uL (ref 1.7–7.7)

## 2011-07-16 LAB — GLUCOSE, CAPILLARY
Glucose-Capillary: 88 mg/dL (ref 70–99)
Glucose-Capillary: 200 mg/dL — ABNORMAL HIGH (ref 70–99)
Glucose-Capillary: 111 mg/dL — ABNORMAL HIGH (ref 70–99)

## 2011-07-16 MED ORDER — LISINOPRIL 10 MG PO TABS
10.0000 mg | ORAL_TABLET | Freq: Every day | ORAL | Status: DC
  Administered 2011-07-16 – 2011-07-19 (×4): 10 mg via ORAL
  Filled 2011-07-16 (×5): qty 1

## 2011-07-16 NOTE — Progress Notes (Signed)
Trauma Service Note  Subjective: The patient did well on BiPap overnight.  Secretions are much less.  Much more alert and awake this morning.  Oxygen saturations are much better.    Objective: Vital signs in last 24 hours: Temp:  [97.7 F (36.5 C)-98.6 F (37 C)] 97.7 F (36.5 C) (02/07 0746) Pulse Rate:  [58-61] 60  (02/07 0755) Resp:  [10-45] 18  (02/07 0755) BP: (129-180)/(56-108) 174/82 mmHg (02/07 0755) SpO2:  [93 %-100 %] 100 % (02/07 0755) FiO2 (%):  [39.3 %-50 %] 40 % (02/07 0751) Weight:  [74.4 kg (164 lb 0.4 oz)] 74.4 kg (164 lb 0.4 oz) (02/07 0600)    Intake/Output from previous day: 02/06 0701 - 02/07 0700 In: 1154.9 [I.V.:1012.4; NG/GT:30; IV Piggyback:112.5] Out: 1050 [Urine:1050] Intake/Output this shift:    General: No acute distress.    Lungs: Much more clear this morning.  Abd: soft, good bowel sounds.  Extremities: No changes  Neuro: Not completely oriented, but much better than yesterday.  Lab Results: CBC   Basename 07/16/11 0230 07/15/11 1559  WBC 9.4 12.2*  HGB 10.8* 11.4*  HCT 34.3* 36.3*  PLT 130* 139*   BMET  Basename 07/15/11 0410 07/14/11 0040  NA 143 141  K 4.2 4.4  CL 112 110  CO2 24 20  GLUCOSE 140* 109*  BUN 20 19  CREATININE 0.95 1.13  CALCIUM 9.0 9.0   PT/INR No results found for this basename: LABPROT:2,INR:2 in the last 72 hours ABG No results found for this basename: PHART:2,PCO2:2,PO2:2,HCO3:2 in the last 72 hours  Studies/Results: Dg Chest Port 1 View  07/15/2011  *RADIOLOGY REPORT*  Clinical Data: Evaluate endotracheal tube position, respiratory failure  PORTABLE CHEST - 1 VIEW  Comparison: Portable chest x-ray of 07/14/2011  Findings:  The tip of the endotracheal tube is approximately 2.9 cm above the carina.  The lungs are poorly aerated.  There is basilar atelectasis present with small effusions and cardiomegaly.  Also there does appear to be pulmonary vascular congestion present as well.  A permanent pacemaker  with AICD lead remains.  IMPRESSION:  1.  Tip of endotracheal tube 2.9 cm above the carina. 2.  For aspiration with cardiomegaly, effusions, and pulmonary vascular congestion.  Original Report Authenticated By: Juline Patch, M.D.    Anti-infectives: Anti-infectives     Start     Dose/Rate Route Frequency Ordered Stop   07/14/11 1130  piperacillin-tazobactam (ZOSYN) IVPB 3.375 g       3.375 g 12.5 mL/hr over 240 Minutes Intravenous 3 times per day 07/14/11 1049            Assessment/Plan: s/p Procedure(s): CYSTOSCOPY WITH RETROGRADE PYELOGRAM/URETERAL STENT PLACEMENT d/c foley Advance diet Will get OT/PT/ST involved.  Swallowing evaluation.    LOS: 5 days   Marta Lamas. Gae Bon, MD, FACS [phone] Trauma Surgeon 07/16/2011
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
## 2011-07-17 ENCOUNTER — Inpatient Hospital Stay (HOSPITAL_COMMUNITY): Payer: Medicare Other

## 2011-07-17 LAB — BASIC METABOLIC PANEL
Calcium: 9.3 mg/dL (ref 8.4–10.5)
Creatinine, Ser: 0.93 mg/dL (ref 0.50–1.35)
GFR calc Af Amer: >90 mL/min (ref >90)
GFR calc non Af Amer: 87 mL/min — ABNORMAL LOW (ref >90)

## 2011-07-17 MED ORDER — ADULT MULTIVITAMIN W/MINERALS CH
1.0000 | ORAL_TABLET | Freq: Every day | ORAL | Status: DC
  Administered 2011-07-17 – 2011-07-21 (×5): 1 via ORAL
  Filled 2011-07-17 (×5): qty 1

## 2011-07-17 MED ORDER — ASPIRIN 81 MG PO CHEW
81.0000 mg | CHEWABLE_TABLET | Freq: Every day | ORAL | Status: DC
  Administered 2011-07-17 – 2011-07-18 (×2): 81 mg via ORAL
  Filled 2011-07-17 (×3): qty 1

## 2011-07-17 MED ORDER — ENSURE IMMUNE HEALTH PO LIQD
237.0000 mL | Freq: Three times a day (TID) | ORAL | Status: DC
  Administered 2011-07-17 – 2011-07-18 (×2): via ORAL
  Administered 2011-07-18: 237 mL via ORAL
  Administered 2011-07-18: 16:00:00 via ORAL
  Administered 2011-07-19 – 2011-07-21 (×4): 237 mL via ORAL
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
## 2011-07-17 NOTE — Progress Notes (Signed)
Speech Language/Pathology Speech Pathology: Dysphagia and Cognitive Treatment Note  Patient was observed with : Mechanical Soft / Ground and Thin liquids.  Patient was noted to have s/s of aspiration : Yes:  Cough. Pt also with frequent hard cough prior to PO intake  Lung Sounds:  Rhonchi  Temperature: afebrile  Patient had just awakened upon SLP arrival. Hard frequent coughing. Pt independently fed himself meal. Ocassional coughing noted with solids or liquids, though not consistent. Feel pts baseline cough present rather than signs of aspiration. Pt masticated solid consistency well.   SLP provided min verbal/visual cues for basic functional problem solving. Pt performs basic familiar tasks independently, needs cues for unfamiliar tasks. Min verbal cues required for pt to verbalize intellectual awareness of deficits.  SLP provided abstract reasoning tasks regarding safety, pt required max cues to provide appropriate response with cues also needed for topic maintenance.    Clinical Impression: Pt continued to exhibit moderate cognitive deficits though also suspect pt with some baseline impairment. Pt does admit to feeling confused. Pt is tolerating diet, though frequent baseline coughing makes assessment  difficult. Will continue to follow for cognition and diet tolerance.   Recommendations:  Continue current diet  Pain:   mild Intervention Required:   No   Goals: Goals Partially Met Dominic Ditty, MA CCC-SLP [phone]

## 2011-07-17 NOTE — Progress Notes (Signed)
Clinical Social Worker spoke with patient at the bedside and patient son over the phone per patient request.  Patient states that he prefers to go home at discharge.  Patient states that he lives at home with his son who is there most of the day.  Patient son confirmed that he would be able to assist patient at home upon discharge.  Per PT/OT recommendations patient and patient son agreeable with home health PT/OT.  CM notified.  Clinical Social Worker will continue to follow for support and to assist with discharge needs.  CSW available for support as needed.  [address] [place], [place] [phone]

## 2011-07-17 NOTE — Progress Notes (Signed)
Nutrition Follow-up  Patient was extubated 2/6; TF has been off since extubation.  Diet Order:  Dysphagia 3-thin  PO's:  ~50% meal completion per RN  Meds: Scheduled Meds:   . albuterol  2.5 mg Nebulization Q4H  . antiseptic oral rinse  15 mL Mouth Rinse q12n4p  . chlorhexidine  15 mL Mouth Rinse BID  . ipratropium  0.5 mg Nebulization Q4H  . lisinopril  10 mg Oral Daily  . mulitivitamin with minerals  1 tablet Oral Daily  . piperacillin-tazobactam (ZOSYN)  IV  3.375 g Intravenous Q8H  . DISCONTD: mulitivitamin  5 mL Per Tube Daily  . DISCONTD: pantoprazole (PROTONIX) IV  40 mg Intravenous Q1200   Continuous Infusions:   . DISCONTD: sodium chloride Stopped (07/16/11 1400)   PRN Meds:.diphenhydrAMINE, diphenhydrAMINE, ondansetron (ZOFRAN) IV, ondansetron (ZOFRAN) IV, ondansetron, sodium chloride, DISCONTD:  HYDROmorphone (DILAUDID) injection, DISCONTD: naloxone  Labs:  CMP     Component Value Date/Time   NA 144 07/16/2011 2340   K 4.4 07/16/2011 2340   CL 110 07/16/2011 2340   CO2 27 07/16/2011 2340   GLUCOSE 148* 07/16/2011 2340   BUN 20 07/16/2011 2340   CREATININE 0.93 07/16/2011 2340   CALCIUM 9.3 07/16/2011 2340   PROT 8.8* 07/11/2011 1430   ALBUMIN 4.1 07/11/2011 1430   AST 272* 07/11/2011 1430   ALT 190* 07/11/2011 1430   ALKPHOS 63 07/11/2011 1430   BILITOT 0.6 07/11/2011 1430   GFRNONAA 87* 07/16/2011 2340   GFRAA >90 07/16/2011 2340   CBG (last 3)   Basename 07/16/11 2224 07/16/11 1154 07/16/11 0656  GLUCAP 111* 200* 88     Intake/Output Summary (Last 24 hours) at 07/17/11 1011 Last data filed at 07/17/11 0900  Gross per 24 hour  Intake    950 ml  Output    771 ml  Net    179 ml    Weight Status:  74.4 kg stable  Re-estimated needs:  2100-2300 kcals, 100-120 grams protein, 2.1-2.3 liters fluid daily  SLP working with patient for swallowing and cognition.  Per RN, patient is eating pretty well, consumed around 50% of breakfast.  Nutrition Dx:  Inadequate oral intake now  related to recent intubation, dysphagia, and poor appetite as evidenced by 50% meal completion, improving.  Goal:  TF plus propofol to provide 90-100% of estimated nutrition needs to support healing and recovery, inactive. New Goal:  PO intake to meet 90-100% of estimated needs.  Intervention:  Ensure TID to maximize oral intake.  Monitor:  PO intake, labs, weight trend.   Hettie Holstein Pager #:  [phone]

## 2011-07-17 NOTE — Progress Notes (Signed)
Physical medicine and rehabilitation consulted. Chart has been reviewed. Physical therapy evaluation occupational therapy evaluation both advise home health therapies. I did discuss this with physical therapy at 1:57 PM February 8 in regards to their plan and recommendations for home therapies. Patient currently ambulating minimal cyst 150 feet. If social barriers exist may have to consider skilled nursing facility. Will hold at this time on formal rehabilitation consult and discuss with case manager.
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
## 2011-07-17 NOTE — Progress Notes (Signed)
Trauma Service Note  Subjective: Patient sitting up eating.  Very alert, seems oriented, but ST at bedside still has concerns about cognition.  Objective: Vital signs in last 24 hours: Temp:  [98.3 F (36.8 C)] 98.3 F (36.8 C) (02/08 0749) Pulse Rate:  [58-62] 59  (02/08 0700) Resp:  [12-25] 15  (02/08 0700) BP: (104-184)/(52-75) 104/52 mmHg (02/08 0700) SpO2:  [91 %-100 %] 97 % (02/08 0729) Last BM Date: 07/16/11  Intake/Output from previous day: 02/07 0701 - 02/08 0700 In: 1137.5 [P.O.:150; I.V.:850; IV Piggyback:137.5] Out: 946 [Urine:945; Stool:1] Intake/Output this shift:    General: Alert, awake, in no acut edistress  Lungs: Deep expiratory wheezes.  Oxygen saturation 93% on 2L.  Significant improvement in bibasilar congestion, atelectasis.  Abd: Soft, tolerating diet, dysphagia III  Extremities: No changes  Neuro: Still a bit confused.  Lab Results: CBC   Basename 07/16/11 2340 07/16/11 1550  WBC 9.4 9.1  HGB 10.7* 10.7*  HCT 33.7* 33.8*  PLT 139* 126*   BMET  Basename 07/16/11 2340 07/15/11 0410  NA 144 143  K 4.4 4.2  CL 110 112  CO2 27 24  GLUCOSE 148* 140*  BUN 20 20  CREATININE 0.93 0.95  CALCIUM 9.3 9.0   PT/INR No results found for this basename: LABPROT:2,INR:2 in the last 72 hours ABG No results found for this basename: PHART:2,PCO2:2,PO2:2,HCO3:2 in the last 72 hours  Studies/Results: Dg Chest Port 1 View  07/17/2011  *RADIOLOGY REPORT*  Clinical Data: Atelectasis.  PORTABLE CHEST - 1 VIEW  Comparison: 07/15/2011.  Findings: Epicardial pacing leads and left subclavian three lead AICD appear unchanged.  Bilateral basilar airspace disease and atelectasis remains present.  Cardiomegaly.  Compared to yesterday's exam, pulmonary aeration has improved, with less pulmonary edema.  Interval removal of enteric tube and endotracheal tube. Left basilar pleural plaque and left pleural effusion.  IMPRESSION:  1.  Interval extubation and removal of  enteric tube. 2.  Improved pulmonary aeration with decreasing edema and atelectasis.  Original Report Authenticated By: Andreas Newport, M.D.    Anti-infectives: Anti-infectives     Start     Dose/Rate Route Frequency Ordered Stop   07/14/11 1130  piperacillin-tazobactam (ZOSYN) IVPB 3.375 g       3.375 g 12.5 mL/hr over 240 Minutes Intravenous 3 times per day 07/14/11 1049            Assessment/Plan: s/p Procedure(s): CYSTOSCOPY WITH RETROGRADE PYELOGRAM/URETERAL STENT PLACEMENT Transfer to SDU  LOS: 6 days   Marta Lamas. Gae Bon, MD, FACS [phone] Trauma Surgeon 07/17/2011

## 2011-07-18 ENCOUNTER — Inpatient Hospital Stay (HOSPITAL_COMMUNITY): Payer: Medicare Other

## 2011-07-18 LAB — GLUCOSE, CAPILLARY
Glucose-Capillary: 110 mg/dL — ABNORMAL HIGH (ref 70–99)
Glucose-Capillary: 116 mg/dL — ABNORMAL HIGH (ref 70–99)

## 2011-07-18 MED ORDER — SODIUM CHLORIDE 0.9 % IJ SOLN
INTRAMUSCULAR | Status: AC
  Filled 2011-07-18: qty 30

## 2011-07-18 MED ORDER — MORPHINE SULFATE 2 MG/ML IJ SOLN
2.0000 mg | INTRAMUSCULAR | Status: DC | PRN
  Administered 2011-07-18 – 2011-07-20 (×6): 2 mg via INTRAVENOUS
  Filled 2011-07-18 (×6): qty 1

## 2011-07-18 MED ORDER — POTASSIUM CHLORIDE CRYS ER 20 MEQ PO TBCR
20.0000 meq | EXTENDED_RELEASE_TABLET | Freq: Every day | ORAL | Status: DC
  Administered 2011-07-18 – 2011-07-21 (×4): 20 meq via ORAL
  Filled 2011-07-18 (×4): qty 1

## 2011-07-18 MED ORDER — ASPIRIN EC 81 MG PO TBEC
81.0000 mg | DELAYED_RELEASE_TABLET | Freq: Every day | ORAL | Status: DC
  Administered 2011-07-19 – 2011-07-21 (×3): 81 mg via ORAL
  Filled 2011-07-18 (×3): qty 1

## 2011-07-18 MED ORDER — ROSUVASTATIN CALCIUM 20 MG PO TABS
20.0000 mg | ORAL_TABLET | Freq: Every day | ORAL | Status: DC
  Administered 2011-07-18 – 2011-07-20 (×3): 20 mg via ORAL
  Filled 2011-07-18 (×6): qty 1

## 2011-07-18 MED ORDER — OXYCODONE-ACETAMINOPHEN 5-325 MG PO TABS
1.0000 | ORAL_TABLET | ORAL | Status: DC | PRN
  Administered 2011-07-19: 1 via ORAL
  Filled 2011-07-18: qty 1

## 2011-07-18 MED ORDER — CARVEDILOL 6.25 MG PO TABS
6.2500 mg | ORAL_TABLET | Freq: Two times a day (BID) | ORAL | Status: DC
  Administered 2011-07-18 – 2011-07-21 (×7): 6.25 mg via ORAL
  Filled 2011-07-18 (×9): qty 1

## 2011-07-18 MED ORDER — CARVEDILOL 6.25 MG PO TABS: 6.2500 mg | ORAL_TABLET | Freq: Two times a day (BID) | ORAL | Status: DC

## 2011-07-18 MED ORDER — FUROSEMIDE 40 MG PO TABS
40.0000 mg | ORAL_TABLET | Freq: Every day | ORAL | Status: DC
  Administered 2011-07-18 – 2011-07-21 (×4): 40 mg via ORAL
  Filled 2011-07-18 (×4): qty 1

## 2011-07-18 NOTE — Progress Notes (Signed)
6 Days Post-Op  Subjective: Patient remained in ICU over night awaiting Step-down bed Overall stable C/o back pain Ambulated yesterday with PT  Objective: Vital signs in last 24 hours: Temp:  [97.6 F (36.4 C)-99 F (37.2 C)] 97.9 F (36.6 C) (02/09 0753) Pulse Rate:  [58-63] 59  (02/09 0800) Resp:  [21-31] 28  (02/09 0800) BP: (146-180)/(64-138) 166/78 mmHg (02/09 0800) SpO2:  [93 %-100 %] 99 % (02/09 0801) Weight:  [164 lb 10.9 oz (74.7 kg)] 164 lb 10.9 oz (74.7 kg) (02/09 0600) Last BM Date: 07/17/11  Intake/Output from previous day: 02/08 0701 - 02/09 0700 In: 490 [P.O.:270; IV Piggyback:150] Out: 1035 [Urine:1035] Intake/Output this shift: Total I/O In: 12.5 [IV Piggyback:12.5] Out: -   General appearance: alert, cooperative and no distress Resp: clear to auscultation bilaterally Chest wall: no tenderness, right sided chest wall tenderness, left sided chest wall tenderness GI: Reducible ventral hernia; soft, non-tender  Lab Results:   Basename 07/16/11 2340 07/16/11 1550  WBC 9.4 9.1  HGB 10.7* 10.7*  HCT 33.7* 33.8*  PLT 139* 126*   BMET  Basename 07/16/11 2340  NA 144  K 4.4  CL 110  CO2 27  GLUCOSE 148*  BUN 20  CREATININE 0.93  CALCIUM 9.3   PT/INR No results found for this basename: LABPROT:2,INR:2 in the last 72 hours ABG No results found for this basename: PHART:2,PCO2:2,PO2:2,HCO3:2 in the last 72 hours  Studies/Results: Dg Chest Port 1 View  07/17/2011  *RADIOLOGY REPORT*  Clinical Data: Atelectasis.  PORTABLE CHEST - 1 VIEW  Comparison: 07/15/2011.  Findings: Epicardial pacing leads and left subclavian three lead AICD appear unchanged.  Bilateral basilar airspace disease and atelectasis remains present.  Cardiomegaly.  Compared to yesterday's exam, pulmonary aeration has improved, with less pulmonary edema.  Interval removal of enteric tube and endotracheal tube. Left basilar pleural plaque and left pleural effusion.  IMPRESSION:  1.   Interval extubation and removal of enteric tube. 2.  Improved pulmonary aeration with decreasing edema and atelectasis.  Original Report Authenticated By: Andreas Newport, M.D.    Anti-infectives: Anti-infectives     Start     Dose/Rate Route Frequency Ordered Stop   07/14/11 1130   piperacillin-tazobactam (ZOSYN) IVPB 3.375 g        3.375 g 12.5 mL/hr over 240 Minutes Intravenous 3 times per day 07/14/11 1049            Assessment/Plan: s/p  CYSTOSCOPY WITH RETROGRADE PYELOGRAM/URETERAL STENT PLACEMENT Transfer to telemetry; encourage incentive spirometry and ambulation  LOS: 7 days    Victorious Cosio K. 07/18/2011

## 2011-07-18 NOTE — Progress Notes (Signed)
Pt was taken down for DG of spine flex with extension this evening. Cervical collar had to be removed for DG. Dr. Corliss Skains  On call ,  notified and said we should wait for Dr. Dixon Boos approval to remove C collar tomorrow for DG. Rescheduled DG for tomorrow morning and will contact Dr. Lindie Spruce to get approval for removal of collar for test. Peter Congo RN
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
## 2011-07-19 ENCOUNTER — Inpatient Hospital Stay (HOSPITAL_COMMUNITY): Payer: Medicare Other

## 2011-07-19 LAB — BASIC METABOLIC PANEL
CO2: 31 mEq/L (ref 19–32)
Calcium: 9.4 mg/dL (ref 8.4–10.5)
Chloride: 103 mEq/L (ref 96–112)
Sodium: 139 mEq/L (ref 135–145)

## 2011-07-19 LAB — GLUCOSE, CAPILLARY
Glucose-Capillary: 110 mg/dL — ABNORMAL HIGH (ref 70–99)
Glucose-Capillary: 120 mg/dL — ABNORMAL HIGH (ref 70–99)

## 2011-07-19 MED ORDER — IPRATROPIUM BROMIDE 0.02 % IN SOLN
0.5000 mg | Freq: Three times a day (TID) | RESPIRATORY_TRACT | Status: DC
  Administered 2011-07-19 – 2011-07-20 (×5): 0.5 mg via RESPIRATORY_TRACT
  Filled 2011-07-19 (×6): qty 2.5

## 2011-07-19 MED ORDER — LISINOPRIL 20 MG PO TABS
20.0000 mg | ORAL_TABLET | Freq: Every day | ORAL | Status: DC
  Administered 2011-07-20 – 2011-07-21 (×2): 20 mg via ORAL
  Filled 2011-07-19 (×2): qty 1

## 2011-07-19 MED ORDER — ALBUTEROL SULFATE (5 MG/ML) 0.5% IN NEBU
2.5000 mg | INHALATION_SOLUTION | Freq: Three times a day (TID) | RESPIRATORY_TRACT | Status: DC
  Administered 2011-07-19 – 2011-07-21 (×7): 2.5 mg via RESPIRATORY_TRACT
  Filled 2011-07-19 (×6): qty 0.5

## 2011-07-19 NOTE — Progress Notes (Signed)
Patient ID: [name], male   DOB: [birth year], 66 y.o.   MRN: [id number] 7 Days Post-Op  Subjective: Pt feels well this morning.  Eating better.  Ready to get collar off if able.  Objective: Vital signs in last 24 hours: Temp:  [97.7 F (36.5 C)-98.3 F (36.8 C)] 97.8 F (36.6 C) (02/10 0541) Pulse Rate:  [57-62] 60  (02/10 0541) Resp:  [19-28] 20  (02/10 0541) BP: (130-172)/(64-89) 158/89 mmHg (02/10 0541) SpO2:  [95 %-100 %] 98 % (02/10 0541) Weight:  [163 lb 2.3 oz (74 kg)] 163 lb 2.3 oz (74 kg) (02/10 0541) Last BM Date: 07/11/11  Intake/Output from previous day: 02/09 0701 - 02/10 0700 In: 345 [P.O.:240; IV Piggyback:50] Out: 1875 [Urine:1875] Intake/Output this shift:    PE: Heart: regular Lungs: CTAB, still has O2 on. Abd: soft, NT, mild distention, incisional hernia at superior portion of old scar. Ext: NVI  Lab Results:   Basename 07/16/11 2340 07/16/11 1550  WBC 9.4 9.1  HGB 10.7* 10.7*  HCT 33.7* 33.8*  PLT 139* 126*   BMET  Basename 07/18/11 2338 07/16/11 2340  NA 139 144  K 4.2 4.4  CL 103 110  CO2 31 27  GLUCOSE 106* 148*  BUN 21 20  CREATININE 0.98 0.93  CALCIUM 9.4 9.3   PT/INR No results found for this basename: LABPROT:2,INR:2 in the last 72 hours   Studies/Results: No results found.  Anti-infectives: Anti-infectives     Start     Dose/Rate Route Frequency Ordered Stop   07/14/11 1130  piperacillin-tazobactam (ZOSYN) IVPB 3.375 g       3.375 g 12.5 mL/hr over 240 Minutes Intravenous 3 times per day 07/14/11 1049             Assessment/Plan   Patient Active Problem List  Diagnoses  . Renal injury  . Rib fractures  . H/O: CVA (cardiovascular accident)  . Pneumomediastinum  . Right atrial thrombus  . Anticoagulated on warfarin  . Urethral stricture  . Atrial fibrillation  . Complete heart block  . Ventricular tachycardia   Plan: 1. Will obtain flex-ex x-rays today to hopefully clear c spine so we can remove his  c-collar if appropriate 2. Cont with therapies    LOS: 8 days    Taylee Gunnells E 07/19/2011
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
## 2011-07-19 NOTE — Progress Notes (Addendum)
No complaints, pain controlled, will try and get c collar off today with f/e views.  Foley will be per urology and they have not seen him.

## 2011-07-19 NOTE — Progress Notes (Signed)
Dr. Lindie Spruce wrote order for C Spine cleared after viewing DG, flex and extension of spine today. Called MD to confirm that this order means the C collar can be d/c. He said to remove it per order. Peter Congo RN

## 2011-07-20 DIAGNOSIS — J9600 Acute respiratory failure, unspecified whether with hypoxia or hypercapnia: Secondary | ICD-10-CM

## 2011-07-20 DIAGNOSIS — R0902 Hypoxemia: Secondary | ICD-10-CM

## 2011-07-20 LAB — GLUCOSE, CAPILLARY: Glucose-Capillary: 112 mg/dL — ABNORMAL HIGH (ref 70–99)

## 2011-07-20 MED ORDER — ALBUTEROL SULFATE HFA 108 (90 BASE) MCG/ACT IN AERS
1.0000 | INHALATION_SPRAY | RESPIRATORY_TRACT | Status: DC | PRN
  Filled 2011-07-20: qty 6.7

## 2011-07-20 MED ORDER — NAPROXEN 500 MG PO TABS
500.0000 mg | ORAL_TABLET | Freq: Two times a day (BID) | ORAL | Status: DC
  Administered 2011-07-20 – 2011-07-21 (×3): 500 mg via ORAL
  Filled 2011-07-20 (×7): qty 1

## 2011-07-20 MED ORDER — TIOTROPIUM BROMIDE MONOHYDRATE 18 MCG IN CAPS
18.0000 ug | ORAL_CAPSULE | Freq: Every day | RESPIRATORY_TRACT | Status: DC
  Administered 2011-07-21: 18 ug via RESPIRATORY_TRACT
  Filled 2011-07-20: qty 5

## 2011-07-20 MED ORDER — MORPHINE SULFATE 2 MG/ML IJ SOLN
2.0000 mg | INTRAMUSCULAR | Status: DC | PRN
  Administered 2011-07-20: 2 mg via INTRAVENOUS
  Filled 2011-07-20: qty 1

## 2011-07-20 MED ORDER — TRAMADOL HCL 50 MG PO TABS
100.0000 mg | ORAL_TABLET | Freq: Four times a day (QID) | ORAL | Status: DC
  Administered 2011-07-20 – 2011-07-21 (×3): 100 mg via ORAL
  Filled 2011-07-20 (×12): qty 2

## 2011-07-20 MED ORDER — DOCUSATE SODIUM 100 MG PO CAPS
200.0000 mg | ORAL_CAPSULE | Freq: Two times a day (BID) | ORAL | Status: DC
  Administered 2011-07-20 – 2011-07-21 (×3): 200 mg via ORAL
  Filled 2011-07-20 (×4): qty 2

## 2011-07-20 MED ORDER — OXYCODONE-ACETAMINOPHEN 5-325 MG PO TABS: 1.0000 | ORAL_TABLET | ORAL | Status: DC | PRN

## 2011-07-20 MED ORDER — POLYETHYLENE GLYCOL 3350 17 G PO PACK
17.0000 g | PACK | Freq: Every day | ORAL | Status: DC
  Administered 2011-07-20: 17 g via ORAL
  Filled 2011-07-20 (×3): qty 1

## 2011-07-20 MED ORDER — MAGNESIUM CITRATE PO SOLN
1.0000 | Freq: Two times a day (BID) | ORAL | Status: DC
  Administered 2011-07-20 (×2): 1 via ORAL
  Filled 2011-07-20 (×8): qty 296
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
## 2011-07-20 NOTE — Progress Notes (Signed)
Occupational Therapy Treatment Patient Details Name: [name] MRN: [id number] DOB: Oct 10, 1945 Today's Date: 07/20/2011  OT Assessment/Plan OT Assessment/Plan Comments on Treatment Session: On entry to room, pt with nasal cannula on but on RA.?timeframe. Sats at 80. Incresaed to 2L. O2 sats incrased to 92. Ambulated on 2L with O2 sats decreasing to 84-87. Pt dyspneic minimally but pt with no complaints. At rest, pt O2 sats increased to 92 2L. After ambulation and rest, O2 sats on RA 87. OT Plan: Discharge plan remains appropriate OT Frequency: Min 2X/week Follow Up Recommendations: Home health OT;Supervision/Assistance - 24 hour Equipment Recommended: 3 in 1 bedside comode (for use in shower due to poor endurance) OT Goals Acute Rehab OT Goals OT Goal Formulation: With patient ADL Goals Pt Will Perform Grooming: with supervision;Standing at sink ADL Goal: Grooming - Progress: Met Pt Will Perform Lower Body Bathing: with supervision;Sit to stand in shower ADL Goal: Lower Body Bathing - Progress: Progressing toward goals Pt Will Perform Lower Body Dressing: with supervision;Sit to stand from chair ADL Goal: Lower Body Dressing - Progress: Progressing toward goals Pt Will Perform Tub/Shower Transfer: Tub transfer;with supervision;Grab bars ADL Goal: Tub/Shower Transfer - Progress: Progressing toward goals Additional ADL Goal #1: Pt will complete all aspects of toileting with S. ADL Goal: Additional Goal #1 - Progress: Met Miscellaneous OT Goals Miscellaneous OT Goal #1: Pt will name three things he will need assist with at home to increased awareness of deficits before d/c. OT Goal: Miscellaneous Goal #1 - Progress: Progressing toward goals  OT Treatment Precautions/Restrictions  Precautions Precautions: Fall Restrictions Weight Bearing Restrictions: No   ADL ADL Eating/Feeding: Simulated;Independent Where Assessed - Eating/Feeding: Chair Grooming: Performed;Wash/dry  hands;Wash/dry face;Teeth care;Modified independent Where Assessed - Grooming: Standing at sink Upper Body Bathing: Simulated;Supervision/safety Where Assessed - Upper Body Bathing: Standing at sink Lower Body Bathing: Simulated;Minimal assistance Where Assessed - Lower Body Bathing: Sit to stand from chair Upper Body Dressing: Simulated;Supervision/safety Where Assessed - Upper Body Dressing: Sitting, chair;Unsupported Lower Body Dressing: Simulated;Supervision/safety Where Assessed - Lower Body Dressing: Sit to stand from chair;Unsupported Toilet Transfer: Simulated;Supervision/safety Toilet Transfer Method: Proofreader: Comfort height toilet Toileting - Clothing Manipulation: Independent Where Assessed - Toileting Clothing Manipulation: Standing;Sit to stand from 3-in-1 or toilet Toileting - Hygiene: Independent Where Assessed - Toileting Hygiene: Standing Tub/Shower Transfer: Not assessed Ambulation Related to ADLs: supervision (desat to 87 O2 2L while walking.) ADL Comments: Overall supervision for ADL (O2 sats 80 on RA. increased O2 to 2L. O2 increased to 92.) Mobility  Bed Mobility Bed Mobility: No Supine to Sit: 5: Supervision;With rails Sitting - Scoot to Edge of Bed: 5: Supervision Transfers Transfers: Yes Sit to Stand: 5: Supervision Sit to Stand Details (indicate cue type and reason): Cues for safe technique and hand placement Stand to Sit: 5: Supervision Stand to Sit Details: A to control descent and cues for safe hand placement Exercises    End of Session OT - End of Session Equipment Utilized During Treatment: Gait belt Activity Tolerance: Patient tolerated treatment well (On entry to room. Pt on RA. O2 sats 80. Pt put on 2L for amb) Patient left: in chair;with call bell in reach Nurse Communication: Mobility status for ambulation;Other (comment) (O2 sats. ) General Behavior During Session: [facility] for tasks performed Cognition: Impaired,  at baseline Cognitive Impairment: decreased safety awareness  Laksh Hinners,HILLARY  07/20/2011, 5:40 PM [facility], OTR/L  [phone] 07/20/2011

## 2011-07-20 NOTE — Progress Notes (Signed)
Patient examined and I agree with the assessment and plan  Violeta Gelinas, MD, MPH, FACS Pager: [phone]  07/20/2011 10:13 AM
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
## 2011-07-20 NOTE — Progress Notes (Signed)
Speech Language/Pathology Speech Pathology: Dysphagia/cognitive Treatment Note  Patient was observed with : Thin liquids.  Patient was noted to have s/s of aspiration : No:    Lung Sounds:  clear Temperature: afebrile  SLp provided trials of thin liquids via straw with no overt s/s of aspiration. SLP provided repeated verbal cues for storage of new information. Pt was unable to verbalize immediate recall despite max cues. Moderate verbal and visual cues necessary for problem solving with basic functional task. Using room phone, calling out with provided telephone number.   Clinical Impression: Pt tolerating diet with no s/s of aspiration, consuming 100% of meals. Pt continues to demonstrates functional problem solving and memory deficits though he is able to carry out basic familiar tasks (use of cell phone). Suspect pt may be at cognitive baseline given history of non-compliance and ETOH abuse. Will reduce therapy to 1x a week to continue to monitor for improvement and need for continued therapy.   Recommendations:  Continue regular diet, SLP will visit pt 1x a week for cognitive therapy.   Pain:   none Intervention Required:   No   Goals: Goals Partially Met  Harlon Ditty, MA CCC-SLP [phone]
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
## 2011-07-21 LAB — GLUCOSE, CAPILLARY
Glucose-Capillary: 105 mg/dL — ABNORMAL HIGH (ref 70–99)
Glucose-Capillary: 89 mg/dL (ref 70–99)

## 2011-07-21 MED ORDER — NAPROXEN 500 MG PO TABS: 500.0000 mg | ORAL_TABLET | Freq: Two times a day (BID) | ORAL | Status: DC

## 2011-07-21 MED ORDER — TRAMADOL HCL 50 MG PO TABS: 50.0000 mg | ORAL_TABLET | Freq: Four times a day (QID) | ORAL | Status: DC | PRN

## 2011-07-21 MED ORDER — TIOTROPIUM BROMIDE MONOHYDRATE 18 MCG IN CAPS: 18.0000 ug | ORAL_CAPSULE | Freq: Every day | RESPIRATORY_TRACT | Status: DC

## 2011-07-21 NOTE — Progress Notes (Signed)
Agree Sharin Altidor, MD, MPH, FACS Pager: [phone]  
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
## 2011-07-21 NOTE — Progress Notes (Signed)
Occupational Therapy Treatment Patient Details Name: Jaiel Saraceno MRN: [id number] DOB: [birth year] Today's Date: 07/21/2011  OT Assessment/Plan OT Assessment/Plan Comments on Treatment Session: Pts stats in low 90s with 2 L O2. OT Plan: Discharge plan remains appropriate OT Frequency: Min 2X/week Follow Up Recommendations: Home health OT;Supervision/Assistance - 24 hour Equipment Recommended: 3 in 1 bedside comode OT Goals Acute Rehab OT Goals OT Goal Formulation: With patient Time For Goal Achievement: 2 weeks ADL Goals Pt Will Perform Grooming: with supervision;Standing at sink ADL Goal: Grooming - Progress: Met Pt Will Perform Lower Body Dressing: with supervision;Sit to stand from chair ADL Goal: Lower Body Dressing - Progress: Met Additional ADL Goal #1: Pt will complete all aspects of toileting with S. ADL Goal: Additional Goal #1 - Progress: Met  OT Treatment Precautions/Restrictions  Precautions Precautions: Fall Required Braces or Orthoses: No Restrictions Weight Bearing Restrictions: No   ADL ADL Eating/Feeding: Performed;Independent Where Assessed - Eating/Feeding: Edge of bed Grooming: Performed;Wash/dry hands;Wash/dry face;Teeth care;Brushing hair;Supervision/safety Grooming Details (indicate cue type and reason): S for balance only Where Assessed - Grooming: Standing at sink Upper Body Dressing: Performed;Set up;Supervision/safety Upper Body Dressing Details (indicate cue type and reason): S when standing Where Assessed - Upper Body Dressing: Sit to stand from chair;Unsupported Lower Body Dressing: Performed;Supervision/safety Lower Body Dressing Details (indicate cue type and reason): S for balance only when standing Where Assessed - Lower Body Dressing: Sit to stand from chair;Unsupported Ambulation Related to ADLs: supervision and one LOB when turning around requiring mod assist to recover. ADL Comments: Overall supervision for ADL Mobility   Transfers Transfers: Yes Sit to Stand: 5: Supervision Stand to Sit: 5: Supervision Exercises    End of Session OT - End of Session Activity Tolerance: Patient tolerated treatment well Patient left: in chair;with call bell in reach;with family/visitor present General Behavior During Session: [facility] for tasks performed Cognition: Impaired, at baseline Cognitive Impairment: decreased safety awareness  Hope Budds [phone] 07/21/2011, 9:36 AM

## 2011-07-21 NOTE — Progress Notes (Signed)
Subjective:  Ready to go, no CP, no SOB  Objective:  Vital Signs in the last 24 hours: Temp:  [97.5 F (36.4 C)-97.6 F (36.4 C)] 97.5 F (36.4 C) (02/12 0510) Pulse Rate:  [59-62] 61  (02/12 0929) Resp:  [20] 20  (02/12 0510) BP: (135-159)/(66-81) 148/81 mmHg (02/12 0939) SpO2:  [86 %-98 %] 97 % (02/12 0944) Weight:  [74.8 kg (164 lb 14.5 oz)] 74.8 kg (164 lb 14.5 oz) (02/12 0510)  Intake/Output from previous day: 02/11 0701 - 02/12 0700 In: 720 [P.O.:720] Out: 1275 [Urine:1275]   Physical Exam: Alert RRR No neck collar No edema   Lab Results: No results found for this basename: WBC:2,HGB:2,PLT:2 in the last 72 hours  Basename 07/18/11 2338  NA 139  K 4.2  CL 103  CO2 31  GLUCOSE 106*  BUN 21  CREATININE 0.98     Imaging: Dg Cervical Spine With Flex & Extend  07/19/2011  *RADIOLOGY REPORT*  Clinical Data: Neck pain.  CERVICAL SPINE COMPLETE WITH FLEXION AND EXTENSION VIEWS  Comparison: CT cervical spine 07/11/2011.  Findings: Moderate degenerative cervical spondylosis with disc disease and facet disease most notable at C3-4, C4-5 and C5-6. No instability or subluxation with flexion/extension.  No acute bony findings or abnormal prevertebral soft tissue swelling.  IMPRESSION: Degenerative cervical spondylosis with disc disease and facet disease. No instability/subluxation with flexion/extension.  Original Report Authenticated By: P. Loralie Champagne, M.D.   Personally viewed.   Assessment/Plan:  Active Problems:  Renal injury  Rib fractures  H/O: CVA (cardiovascular accident)  Pneumomediastinum  Right atrial thrombus  Anticoagulated on warfarin  Urethral stricture  Atrial fibrillation  Complete heart block  Ventricular tachycardia  -OK to go.  -Continue home meds.  -Will see in one week.     SKAINS, MARK 07/21/2011, 11:07 AM

## 2011-07-21 NOTE — Progress Notes (Signed)
Home O2 and HHRN arranged with [facility] per pt choice and insurance coverage. Address and phone number in Epic confirmed with patient as correct. Face to face completed and sent to MD for signature. [facility] contact info written at bottom of the Pt D/C Instructions.

## 2011-07-21 NOTE — Discharge Summary (Signed)
Cleatus Gabriel, MD, MPH, FACS Pager: [phone]  
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
## 2011-07-21 NOTE — Progress Notes (Signed)
[name] to be D/C'd Home per MD order.  Discussed with the patient and all questions fully answered.   [name], [name]  Home Medication Instructions ZOX:[id number]   Printed on:07/21/11 1153  Medication Information                    lisinopril (PRINIVIL,ZESTRIL) 10 MG tablet Take 10 mg by mouth daily.           potassium chloride SA (K-DUR,KLOR-CON) 20 MEQ tablet Take 20 mEq by mouth daily.           rosuvastatin (CRESTOR) 20 MG tablet Take 20 mg by mouth daily.           furosemide (LASIX) 40 MG tablet Take 40 mg by mouth daily.           carvedilol (COREG) 6.25 MG tablet Take 6.25 mg by mouth 2 (two) times daily with a meal.           aspirin EC 81 MG tablet Take 81 mg by mouth daily.           albuterol-ipratropium (COMBIVENT) 18-103 MCG/ACT inhaler Inhale 2 puffs into the lungs every 6 (six) hours as needed. For shortness of breath / wheezing.           naproxen (NAPROSYN) 500 MG tablet Take 1 tablet (500 mg total) by mouth 2 (two) times daily with a meal.           tiotropium (SPIRIVA) 18 MCG inhalation capsule Place 1 capsule (18 mcg total) into inhaler and inhale daily.           traMADol (ULTRAM) 50 MG tablet Take 1-2 tablets (50-100 mg total) by mouth every 6 (six) hours as needed for pain.             VVS, Skin clean, dry and intact without evidence of skin break down, no evidence of skin tears noted. IV catheter discontinued intact. Site without signs and symptoms of complications. Dressing and pressure applied.  An After Visit Summary was printed and given to the patient. Patient escorted via WC, and D/C home via private auto.  Debbora Presto 07/21/2011 11:53 AM

## 2011-07-21 NOTE — Discharge Instructions (Signed)
No driving or riding a scooter/bicycle.  Continue to use incentive spirometer (breathing exerciser) every hour while you are awake.  Follow diet instructions. See speech therapist at your earliest opportunity (take prescription for speech therapy to a rehabilitation center that provides it, such as the [facility])

## 2011-07-21 NOTE — Progress Notes (Signed)
Clinical Child psychotherapist following for continued support.  Per previous conversation with patient, patient and patient son plan on returning home with home health O2 and RN.  Patient plans to discharge today with [facility].  Clinical Social Worker will sign off for now as social work intervention is no longer needed. Please consult [place] again if new need arises.  [address] [place], [place] [phone]

## 2011-07-21 NOTE — Progress Notes (Signed)
Pt O2 sat on room air was 86%. On 2L, O2 sats were 98%.

## 2011-07-22 ENCOUNTER — Telehealth (INDEPENDENT_AMBULATORY_CARE_PROVIDER_SITE_OTHER): Payer: Self-pay

## 2011-07-22 NOTE — Telephone Encounter (Signed)
Patient called in requesting a different pain medicine. The tramadol is not helping pain at all per patient. Please advise.Marland KitchenMarland Kitchen

## 2011-07-23 ENCOUNTER — Other Ambulatory Visit: Payer: Self-pay

## 2011-07-23 ENCOUNTER — Emergency Department (HOSPITAL_COMMUNITY)
Admission: EM | Admit: 2011-07-23 | Discharge: 2011-07-24 | Disposition: A | Discharge status: Home / Self Care | Payer: Medicare Other | Attending: Emergency Medicine | Admitting: Emergency Medicine

## 2011-07-23 ENCOUNTER — Telehealth: Payer: Self-pay | Admitting: Orthopedic Surgery

## 2011-07-23 ENCOUNTER — Encounter (HOSPITAL_COMMUNITY): Payer: Self-pay

## 2011-07-23 DIAGNOSIS — Z951 Presence of aortocoronary bypass graft: Secondary | ICD-10-CM | POA: Insufficient documentation

## 2011-07-23 DIAGNOSIS — Z954 Presence of other heart-valve replacement: Secondary | ICD-10-CM | POA: Insufficient documentation

## 2011-07-23 DIAGNOSIS — R112 Nausea with vomiting, unspecified: Secondary | ICD-10-CM | POA: Insufficient documentation

## 2011-07-23 DIAGNOSIS — J449 Chronic obstructive pulmonary disease, unspecified: Secondary | ICD-10-CM | POA: Insufficient documentation

## 2011-07-23 DIAGNOSIS — J4489 Other specified chronic obstructive pulmonary disease: Secondary | ICD-10-CM | POA: Insufficient documentation

## 2011-07-23 DIAGNOSIS — I2510 Atherosclerotic heart disease of native coronary artery without angina pectoris: Secondary | ICD-10-CM | POA: Insufficient documentation

## 2011-07-23 DIAGNOSIS — R4182 Altered mental status, unspecified: Secondary | ICD-10-CM | POA: Insufficient documentation

## 2011-07-23 DIAGNOSIS — Z8673 Personal history of transient ischemic attack (TIA), and cerebral infarction without residual deficits: Secondary | ICD-10-CM | POA: Insufficient documentation

## 2011-07-23 DIAGNOSIS — I10 Essential (primary) hypertension: Secondary | ICD-10-CM | POA: Insufficient documentation

## 2011-07-23 DIAGNOSIS — F3130 Bipolar disorder, current episode depressed, mild or moderate severity, unspecified: Secondary | ICD-10-CM | POA: Insufficient documentation

## 2011-07-23 DIAGNOSIS — Z7982 Long term (current) use of aspirin: Secondary | ICD-10-CM | POA: Insufficient documentation

## 2011-07-23 DIAGNOSIS — Z79899 Other long term (current) drug therapy: Secondary | ICD-10-CM | POA: Insufficient documentation

## 2011-07-23 DIAGNOSIS — E785 Hyperlipidemia, unspecified: Secondary | ICD-10-CM | POA: Insufficient documentation

## 2011-07-23 DIAGNOSIS — I5022 Chronic systolic (congestive) heart failure: Secondary | ICD-10-CM | POA: Insufficient documentation

## 2011-07-23 MED ORDER — TRAMADOL HCL 50 MG PO TABS: 50.0000 mg | ORAL_TABLET | Freq: Four times a day (QID) | ORAL | Status: AC

## 2011-07-23 MED ORDER — HYDROCODONE-ACETAMINOPHEN 5-325 MG PO TABS: 1.0000 | ORAL_TABLET | ORAL | Status: AC | PRN

## 2011-07-23 NOTE — ED Notes (Signed)
Pt states he vomited 5 times earlier today.  Pt denies any vomiting or coughing up blood.  Pt denies any pain at this time.

## 2011-07-23 NOTE — Telephone Encounter (Signed)
Spoke with patient's son who says that the tramadol is not controlling his pain well enough. I suggested he take the tramadol 100mg  q6h scheduled and prescribed some Norco 5/325.

## 2011-07-23 NOTE — Telephone Encounter (Signed)
Will D/W trauma PAs

## 2011-07-24 ENCOUNTER — Encounter (HOSPITAL_COMMUNITY): Payer: Self-pay | Admitting: Radiology

## 2011-07-24 ENCOUNTER — Emergency Department (HOSPITAL_COMMUNITY): Payer: Medicare Other

## 2011-07-24 LAB — CBC
HCT: 35.4 % — ABNORMAL LOW (ref 39.0–52.0)
MCH: 29.8 pg (ref 26.0–34.0)
MCV: 92.4 fL (ref 78.0–100.0)
RBC: 3.83 MIL/uL — ABNORMAL LOW (ref 4.22–5.81)
WBC: 13.5 10*3/uL — ABNORMAL HIGH (ref 4.0–10.5)

## 2011-07-24 LAB — URINALYSIS, ROUTINE W REFLEX MICROSCOPIC
Bilirubin Urine: NEGATIVE
Glucose, UA: NEGATIVE mg/dL
Ketones, ur: NEGATIVE mg/dL
Protein, ur: NEGATIVE mg/dL

## 2011-07-24 LAB — BASIC METABOLIC PANEL
BUN: 22 mg/dL (ref 6–23)
CO2: 28 mEq/L (ref 19–32)
Calcium: 9.9 mg/dL (ref 8.4–10.5)
Creatinine, Ser: 1.09 mg/dL (ref 0.50–1.35)
GFR calc non Af Amer: 70 mL/min — ABNORMAL LOW (ref >90)
Glucose, Bld: 117 mg/dL — ABNORMAL HIGH (ref 70–99)

## 2011-07-24 LAB — URINE MICROSCOPIC-ADD ON

## 2011-07-24 LAB — URINE CULTURE
Colony Count: NO GROWTH
Culture: NO GROWTH

## 2011-07-24 LAB — DIFFERENTIAL
Eosinophils Absolute: 0.6 10*3/uL (ref 0.0–0.7)
Eosinophils Relative: 4 % (ref 0–5)
Lymphocytes Relative: 11 % — ABNORMAL LOW (ref 12–46)
Lymphs Abs: 1.5 10*3/uL (ref 0.7–4.0)
Monocytes Absolute: 0.9 10*3/uL (ref 0.1–1.0)
Monocytes Relative: 6 % (ref 3–12)

## 2011-07-24 MED ORDER — ONDANSETRON HCL 4 MG/2ML IJ SOLN
4.0000 mg | Freq: Once | INTRAMUSCULAR | Status: AC
  Administered 2011-07-24: 4 mg via INTRAVENOUS
  Filled 2011-07-24: qty 2

## 2011-07-24 NOTE — ED Notes (Signed)
Dr Bebe Shaggy speaking with patient and family

## 2011-07-24 NOTE — ED Notes (Signed)
Patient transported to CT 
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
## 2011-07-24 NOTE — Discharge Instructions (Signed)

## 2011-07-24 NOTE — ED Notes (Signed)
X-ray at bedside

## 2011-07-24 NOTE — ED Notes (Signed)
Daughter [name] please call her when pt is ready to go home [phone]

## 2011-08-06 ENCOUNTER — Encounter: Payer: Self-pay | Admitting: Pulmonary Disease

## 2011-08-06 ENCOUNTER — Ambulatory Visit (INDEPENDENT_AMBULATORY_CARE_PROVIDER_SITE_OTHER)
Admission: RE | Admit: 2011-08-06 | Discharge: 2011-08-06 | Disposition: A | Discharge status: Home / Self Care | Payer: Medicare Other | Source: Ambulatory Visit | Attending: Pulmonary Disease | Admitting: Pulmonary Disease

## 2011-08-06 ENCOUNTER — Ambulatory Visit (INDEPENDENT_AMBULATORY_CARE_PROVIDER_SITE_OTHER): Payer: Medicare Other | Admitting: Pulmonary Disease

## 2011-08-06 VITALS — BP 138/90 | HR 61 | Temp 97.6°F | Ht 67.0 in | Wt 149.2 lb

## 2011-08-06 DIAGNOSIS — I5022 Chronic systolic (congestive) heart failure: Secondary | ICD-10-CM

## 2011-08-06 DIAGNOSIS — J449 Chronic obstructive pulmonary disease, unspecified: Secondary | ICD-10-CM | POA: Insufficient documentation

## 2011-08-06 DIAGNOSIS — I509 Heart failure, unspecified: Secondary | ICD-10-CM

## 2011-08-06 NOTE — Patient Instructions (Addendum)
Breathing test shows lung capacity at 42% Stay on spiriva Use oxygen during sleep only- Ok to stay off daytime Chest xray today Rehab program in the future
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
## 2011-08-06 NOTE — Assessment & Plan Note (Signed)
Breathing test shows lung capacity at 42% Stay on spiriva Use oxygen during sleep only- Ok to stay off daytime Chest xray shows improved infiltrates - likely contusions that resolved Pulm Rehab program in the future On ,next visit can reassess nocturnal oximetry & need for O2

## 2011-08-07 ENCOUNTER — Inpatient Hospital Stay: Payer: Medicare Other | Admitting: Pulmonary Disease

## 2011-08-20 ENCOUNTER — Ambulatory Visit (INDEPENDENT_AMBULATORY_CARE_PROVIDER_SITE_OTHER): Payer: Medicare Other | Admitting: *Deleted

## 2011-08-20 ENCOUNTER — Telehealth: Payer: Self-pay | Admitting: *Deleted

## 2011-08-20 ENCOUNTER — Encounter: Payer: Self-pay | Admitting: Cardiology

## 2011-08-20 ENCOUNTER — Other Ambulatory Visit (HOSPITAL_COMMUNITY): Payer: Self-pay | Admitting: Diagnostic Radiology

## 2011-08-20 ENCOUNTER — Encounter: Payer: Self-pay | Admitting: Internal Medicine

## 2011-08-20 ENCOUNTER — Ambulatory Visit (INDEPENDENT_AMBULATORY_CARE_PROVIDER_SITE_OTHER): Payer: Medicare Other | Admitting: Cardiology

## 2011-08-20 VITALS — BP 150/78 | HR 63 | Ht 67.0 in | Wt 151.4 lb

## 2011-08-20 DIAGNOSIS — I509 Heart failure, unspecified: Secondary | ICD-10-CM

## 2011-08-20 DIAGNOSIS — I4891 Unspecified atrial fibrillation: Secondary | ICD-10-CM

## 2011-08-20 DIAGNOSIS — R0602 Shortness of breath: Secondary | ICD-10-CM

## 2011-08-20 DIAGNOSIS — I5022 Chronic systolic (congestive) heart failure: Secondary | ICD-10-CM

## 2011-08-20 DIAGNOSIS — Z8673 Personal history of transient ischemic attack (TIA), and cerebral infarction without residual deficits: Secondary | ICD-10-CM

## 2011-08-20 DIAGNOSIS — I2510 Atherosclerotic heart disease of native coronary artery without angina pectoris: Secondary | ICD-10-CM

## 2011-08-20 DIAGNOSIS — R319 Hematuria, unspecified: Secondary | ICD-10-CM

## 2011-08-20 DIAGNOSIS — R0989 Other specified symptoms and signs involving the circulatory and respiratory systems: Secondary | ICD-10-CM

## 2011-08-20 DIAGNOSIS — I442 Atrioventricular block, complete: Secondary | ICD-10-CM

## 2011-08-20 DIAGNOSIS — I472 Ventricular tachycardia: Secondary | ICD-10-CM

## 2011-08-20 LAB — ICD DEVICE OBSERVATION
AL AMPLITUDE: 1.4661 mv
ATRIAL PACING ICD: 0 pct
BAMS-0001: 135 {beats}/min
CHARGE TIME: 10.38 s
FVT: 0
LV LEAD THRESHOLD: 1.5 V
PACEART VT: 0
RV LEAD IMPEDENCE ICD: 912 Ohm
TOT-0001: 2
TZAT-0001FASTVT: 1
TZAT-0002ATACH: NEGATIVE
TZAT-0004FASTVT: 8
TZAT-0005FASTVT: 88 pct
TZAT-0011SLOWVT: 10 ms
TZAT-0011SLOWVT: 10 ms
TZAT-0012ATACH: 150 ms
TZAT-0012FASTVT: 200 ms
TZAT-0012SLOWVT: 200 ms
TZAT-0012SLOWVT: 200 ms
TZAT-0013FASTVT: 2
TZAT-0013SLOWVT: 2
TZAT-0013SLOWVT: 2
TZAT-0018ATACH: NEGATIVE
TZAT-0018FASTVT: NEGATIVE
TZAT-0018SLOWVT: NEGATIVE
TZAT-0019ATACH: 6 V
TZAT-0019SLOWVT: 8 V
TZAT-0019SLOWVT: 8 V
TZAT-0020ATACH: 1.5 ms
TZAT-0020ATACH: 1.5 ms
TZON-0003ATACH: 450 ms
TZON-0003FASTVT: 270 ms
TZON-0003SLOWVT: 350 ms
TZON-0004SLOWVT: 16
TZON-0005SLOWVT: 12
TZST-0001ATACH: 4
TZST-0001ATACH: 5
TZST-0001FASTVT: 5
TZST-0001SLOWVT: 4
TZST-0002ATACH: NEGATIVE
TZST-0003FASTVT: 35 J
TZST-0003FASTVT: 35 J
TZST-0003SLOWVT: 20 J
TZST-0003SLOWVT: 35 J
VF: 0

## 2011-08-20 MED ORDER — LISINOPRIL 20 MG PO TABS: 20.0000 mg | ORAL_TABLET | Freq: Every day | ORAL | Status: DC

## 2011-08-20 NOTE — Progress Notes (Signed)
defib check in clinic  

## 2011-08-20 NOTE — Telephone Encounter (Signed)
This was done in error.

## 2011-08-20 NOTE — Patient Instructions (Addendum)
Increase lisinopril to 20mg  daily. You can take two 10mg  tablets daily.  Your physician recommends that you return for a FASTING lipid profile /BMET /liver profile/CBC/BNP in 1 week.  Your physician has requested that you have a carotid duplex. This test is an ultrasound of the carotid arteries in your neck. It looks at blood flow through these arteries that supply the brain with blood. Allow one hour for this exam. There are no restrictions or special instructions. IN 1 week when you return for fasting lab.   Your physician recommends that you schedule a follow-up appointment in: 1 month with Dr Shirlee Latch.

## 2011-08-21 ENCOUNTER — Emergency Department (HOSPITAL_COMMUNITY): Payer: Medicare Other

## 2011-08-21 ENCOUNTER — Encounter (HOSPITAL_COMMUNITY): Payer: Self-pay | Admitting: *Deleted

## 2011-08-21 ENCOUNTER — Emergency Department (HOSPITAL_COMMUNITY)
Admission: EM | Admit: 2011-08-21 | Discharge: 2011-08-22 | Disposition: A | Discharge status: Home / Self Care | Payer: Medicare Other | Attending: Emergency Medicine | Admitting: Emergency Medicine

## 2011-08-21 DIAGNOSIS — Z7982 Long term (current) use of aspirin: Secondary | ICD-10-CM | POA: Insufficient documentation

## 2011-08-21 DIAGNOSIS — J4489 Other specified chronic obstructive pulmonary disease: Secondary | ICD-10-CM | POA: Insufficient documentation

## 2011-08-21 DIAGNOSIS — I10 Essential (primary) hypertension: Secondary | ICD-10-CM | POA: Insufficient documentation

## 2011-08-21 DIAGNOSIS — R3915 Urgency of urination: Secondary | ICD-10-CM | POA: Insufficient documentation

## 2011-08-21 DIAGNOSIS — E785 Hyperlipidemia, unspecified: Secondary | ICD-10-CM | POA: Insufficient documentation

## 2011-08-21 DIAGNOSIS — R300 Dysuria: Secondary | ICD-10-CM | POA: Insufficient documentation

## 2011-08-21 DIAGNOSIS — F341 Dysthymic disorder: Secondary | ICD-10-CM | POA: Insufficient documentation

## 2011-08-21 DIAGNOSIS — K439 Ventral hernia without obstruction or gangrene: Secondary | ICD-10-CM | POA: Insufficient documentation

## 2011-08-21 DIAGNOSIS — J449 Chronic obstructive pulmonary disease, unspecified: Secondary | ICD-10-CM | POA: Insufficient documentation

## 2011-08-21 DIAGNOSIS — Z8673 Personal history of transient ischemic attack (TIA), and cerebral infarction without residual deficits: Secondary | ICD-10-CM | POA: Insufficient documentation

## 2011-08-21 DIAGNOSIS — R609 Edema, unspecified: Secondary | ICD-10-CM | POA: Insufficient documentation

## 2011-08-21 DIAGNOSIS — R350 Frequency of micturition: Secondary | ICD-10-CM | POA: Insufficient documentation

## 2011-08-21 DIAGNOSIS — Z86718 Personal history of other venous thrombosis and embolism: Secondary | ICD-10-CM | POA: Insufficient documentation

## 2011-08-21 DIAGNOSIS — N390 Urinary tract infection, site not specified: Secondary | ICD-10-CM | POA: Insufficient documentation

## 2011-08-21 DIAGNOSIS — N3943 Post-void dribbling: Secondary | ICD-10-CM | POA: Insufficient documentation

## 2011-08-21 DIAGNOSIS — I5022 Chronic systolic (congestive) heart failure: Secondary | ICD-10-CM | POA: Insufficient documentation

## 2011-08-21 DIAGNOSIS — I4891 Unspecified atrial fibrillation: Secondary | ICD-10-CM | POA: Insufficient documentation

## 2011-08-21 DIAGNOSIS — R319 Hematuria, unspecified: Secondary | ICD-10-CM | POA: Insufficient documentation

## 2011-08-21 DIAGNOSIS — Z79899 Other long term (current) drug therapy: Secondary | ICD-10-CM | POA: Insufficient documentation

## 2011-08-21 DIAGNOSIS — I2510 Atherosclerotic heart disease of native coronary artery without angina pectoris: Secondary | ICD-10-CM | POA: Insufficient documentation

## 2011-08-21 LAB — URINE MICROSCOPIC-ADD ON

## 2011-08-21 LAB — URINALYSIS, ROUTINE W REFLEX MICROSCOPIC
Bilirubin Urine: NEGATIVE
Glucose, UA: NEGATIVE mg/dL
Ketones, ur: NEGATIVE mg/dL
Nitrite: NEGATIVE
pH: 6 (ref 5.0–8.0)
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
## 2011-08-22 LAB — COMPREHENSIVE METABOLIC PANEL
ALT: 44 U/L (ref 0–53)
AST: 53 U/L — ABNORMAL HIGH (ref 0–37)
Albumin: 3.3 g/dL — ABNORMAL LOW (ref 3.5–5.2)
Alkaline Phosphatase: 154 U/L — ABNORMAL HIGH (ref 39–117)
CO2: 25 mEq/L (ref 19–32)
Chloride: 102 mEq/L (ref 96–112)
Creatinine, Ser: 0.8 mg/dL (ref 0.50–1.35)
GFR calc non Af Amer: >90 mL/min (ref >90)
Potassium: 4.2 mEq/L (ref 3.5–5.1)
Total Bilirubin: 0.2 mg/dL — ABNORMAL LOW (ref 0.3–1.2)

## 2011-08-22 LAB — DIFFERENTIAL
Basophils Absolute: 0 10*3/uL (ref 0.0–0.1)
Lymphocytes Relative: 26 % (ref 12–46)
Monocytes Absolute: 0.9 10*3/uL (ref 0.1–1.0)
Neutro Abs: 4.4 10*3/uL (ref 1.7–7.7)
Neutrophils Relative %: 55 % (ref 43–77)

## 2011-08-22 LAB — CBC
HCT: 39.2 % (ref 39.0–52.0)
Hemoglobin: 13.3 g/dL (ref 13.0–17.0)
RDW: 13.8 % (ref 11.5–15.5)
WBC: 8 10*3/uL (ref 4.0–10.5)

## 2011-08-22 MED ORDER — PHENAZOPYRIDINE HCL 200 MG PO TABS: 200.0000 mg | ORAL_TABLET | Freq: Three times a day (TID) | ORAL | Status: AC | PRN

## 2011-08-22 MED ORDER — PHENAZOPYRIDINE HCL 100 MG PO TABS
ORAL_TABLET | ORAL | Status: AC
  Administered 2011-08-22: 100 mg via ORAL
  Filled 2011-08-22: qty 1

## 2011-08-22 MED ORDER — CEFPODOXIME PROXETIL 200 MG PO TABS: 200.0000 mg | ORAL_TABLET | Freq: Two times a day (BID) | ORAL | Status: AC

## 2011-08-22 MED ORDER — PHENAZOPYRIDINE HCL 100 MG PO TABS
100.0000 mg | ORAL_TABLET | Freq: Once | ORAL | Status: AC
  Administered 2011-08-22: 100 mg via ORAL

## 2011-08-22 NOTE — Discharge Instructions (Signed)

## 2011-08-22 NOTE — ED Notes (Signed)
Pt is being seen by EDP.  Visitor is at the bedside

## 2011-08-23 DIAGNOSIS — R0989 Other specified symptoms and signs involving the circulatory and respiratory systems: Secondary | ICD-10-CM | POA: Insufficient documentation

## 2011-08-23 DIAGNOSIS — R319 Hematuria, unspecified: Secondary | ICD-10-CM | POA: Insufficient documentation
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
## 2011-08-23 NOTE — Assessment & Plan Note (Signed)
Stable with no exertional chest pain.  Continue ASA 81, Coreg, ACEI, and statin.  Will check lipids/LFTs in 1 week, goal LDL < 70.

## 2011-08-23 NOTE — Assessment & Plan Note (Signed)
Patient has had on and off hematuria.  Recently, his urine has been clear.  Continue ASA 81 for now given CAD and atrial fibrillation.  He follows with urology.

## 2011-08-23 NOTE — Assessment & Plan Note (Addendum)
Patient looks near-euvolemic today.  This is confirmed by Optivol.  Mixed ischemic/nonischemic cardiomyopathy.  Continue Coreg 6.25 mg bid.  I will increase lisinopril to 20 mg daily.  Will get BMET/BNP in 1 week.   He has a BiV-ICD followed by Dr. Johney Frame.

## 2011-08-23 NOTE — Assessment & Plan Note (Signed)
As above, if he can stay abstinent from ETOH, I will start him on Xarelto.
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
## 2011-08-28 ENCOUNTER — Encounter (INDEPENDENT_AMBULATORY_CARE_PROVIDER_SITE_OTHER): Payer: Medicare Other

## 2011-08-28 ENCOUNTER — Other Ambulatory Visit (INDEPENDENT_AMBULATORY_CARE_PROVIDER_SITE_OTHER): Payer: Medicare Other

## 2011-08-28 DIAGNOSIS — R0989 Other specified symptoms and signs involving the circulatory and respiratory systems: Secondary | ICD-10-CM

## 2011-08-28 DIAGNOSIS — I6529 Occlusion and stenosis of unspecified carotid artery: Secondary | ICD-10-CM

## 2011-08-28 DIAGNOSIS — R0602 Shortness of breath: Secondary | ICD-10-CM

## 2011-08-28 DIAGNOSIS — I2510 Atherosclerotic heart disease of native coronary artery without angina pectoris: Secondary | ICD-10-CM

## 2011-08-28 DIAGNOSIS — I5022 Chronic systolic (congestive) heart failure: Secondary | ICD-10-CM

## 2011-08-28 LAB — CBC WITH DIFFERENTIAL/PLATELET
Basophils Absolute: 0.1 10*3/uL (ref 0.0–0.1)
Basophils Relative: 0.8 % (ref 0.0–3.0)
Eosinophils Absolute: 0.7 10*3/uL (ref 0.0–0.7)
Lymphocytes Relative: 17.3 % (ref 12.0–46.0)
MCHC: 33 g/dL (ref 30.0–36.0)
MCV: 88.8 fl (ref 78.0–100.0)
Monocytes Absolute: 0.9 10*3/uL (ref 0.1–1.0)
Neutrophils Relative %: 62.7 % (ref 43.0–77.0)
RDW: 14.5 % (ref 11.5–14.6)

## 2011-08-28 LAB — BASIC METABOLIC PANEL
BUN: 15 mg/dL (ref 6–23)
Chloride: 104 mEq/L (ref 96–112)
Creatinine, Ser: 0.8 mg/dL (ref 0.4–1.5)
GFR: 99.08 mL/min (ref >60.00)
Potassium: 4.7 mEq/L (ref 3.5–5.1)

## 2011-08-28 LAB — LIPID PANEL
Cholesterol: 193 mg/dL (ref 0–200)
LDL Cholesterol: 128 mg/dL — ABNORMAL HIGH (ref 0–99)
Triglycerides: 93 mg/dL (ref 0.0–149.0)
VLDL: 18.6 mg/dL (ref 0.0–40.0)

## 2011-08-28 LAB — HEPATIC FUNCTION PANEL
ALT: 74 U/L — ABNORMAL HIGH (ref 0–53)
AST: 61 U/L — ABNORMAL HIGH (ref 0–37)
Albumin: 3.8 g/dL (ref 3.5–5.2)
Alkaline Phosphatase: 133 U/L — ABNORMAL HIGH (ref 39–117)
Bilirubin, Direct: 0 mg/dL (ref 0.0–0.3)
Total Protein: 7.9 g/dL (ref 6.0–8.3)

## 2011-09-17 ENCOUNTER — Encounter: Payer: Self-pay | Admitting: *Deleted

## 2011-09-17 ENCOUNTER — Ambulatory Visit (INDEPENDENT_AMBULATORY_CARE_PROVIDER_SITE_OTHER): Payer: Medicare Other | Admitting: Adult Health

## 2011-09-17 ENCOUNTER — Encounter: Payer: Self-pay | Admitting: Adult Health

## 2011-09-17 DIAGNOSIS — J449 Chronic obstructive pulmonary disease, unspecified: Secondary | ICD-10-CM

## 2011-09-17 NOTE — Assessment & Plan Note (Signed)
Continue on spiriva .  Discussed pulmonary rehab follow up Dr. Vassie Loll  In 3-4 months
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
## 2011-09-17 NOTE — Patient Instructions (Signed)
Continue on  spiriva 1 puff daily  Continue to stay active.  If you are interested in the pulmonary rehab program, call us back.  follow up Dr. Vassie Loll  In 4 months and As needed

## 2011-09-18 ENCOUNTER — Encounter: Payer: Self-pay | Admitting: Cardiology

## 2011-09-21 ENCOUNTER — Ambulatory Visit: Payer: Medicare Other | Admitting: Cardiology

## 2011-10-20 ENCOUNTER — Encounter: Payer: Self-pay | Admitting: *Deleted

## 2011-10-26 ENCOUNTER — Ambulatory Visit (INDEPENDENT_AMBULATORY_CARE_PROVIDER_SITE_OTHER): Payer: Medicare Other | Admitting: Cardiology

## 2011-10-26 ENCOUNTER — Encounter: Payer: Self-pay | Admitting: Cardiology

## 2011-10-26 VITALS — BP 116/76 | HR 61 | Ht 67.0 in | Wt 155.2 lb

## 2011-10-26 DIAGNOSIS — I5022 Chronic systolic (congestive) heart failure: Secondary | ICD-10-CM

## 2011-10-26 DIAGNOSIS — I4891 Unspecified atrial fibrillation: Secondary | ICD-10-CM

## 2011-10-26 DIAGNOSIS — I2510 Atherosclerotic heart disease of native coronary artery without angina pectoris: Secondary | ICD-10-CM

## 2011-10-26 DIAGNOSIS — E785 Hyperlipidemia, unspecified: Secondary | ICD-10-CM

## 2011-10-26 DIAGNOSIS — I509 Heart failure, unspecified: Secondary | ICD-10-CM

## 2011-10-26 LAB — BASIC METABOLIC PANEL
BUN: 14 mg/dL (ref 6–23)
Chloride: 103 mEq/L (ref 96–112)
Creatinine, Ser: 0.8 mg/dL (ref 0.4–1.5)
Glucose, Bld: 105 mg/dL — ABNORMAL HIGH (ref 70–99)
Potassium: 4.1 mEq/L (ref 3.5–5.1)

## 2011-10-26 MED ORDER — CARVEDILOL 12.5 MG PO TABS: 12.5000 mg | ORAL_TABLET | Freq: Two times a day (BID) | ORAL | Status: DC

## 2011-10-26 NOTE — Patient Instructions (Addendum)
Your physician recommends that you schedule a follow-up appointment in: 3 months with Dr. Shirlee Latch.  BMET today.  Increase Coreg to 12.5mg  twice daily.

## 2011-10-27 DIAGNOSIS — E785 Hyperlipidemia, unspecified: Secondary | ICD-10-CM | POA: Insufficient documentation

## 2011-10-27 NOTE — Assessment & Plan Note (Signed)
Stable with no exertional chest pain.  Continue ASA 81, Coreg, ACEI, and statin.

## 2011-10-27 NOTE — Assessment & Plan Note (Signed)
Patient looks euvolemic today, NYHA class II symptoms.   - Increase Coreg to 12.5 mg bid - Continue lisinopril and current Lasix dosing.  - BMET today.  - He has been compliant so far with followup, will plan to start spironolactone at next visit in 3 months.
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
## 2011-10-27 NOTE — Assessment & Plan Note (Signed)
He is on Crestor 40 but LDL was well above 70 (goal).  His LFTs are mildly elevated, suspect due to prior ETOH abuse.  I asked him to make sure to take the Crestor daily and watch his diet.  I will repeat his lipids at next appointment and consider adding Zetia.

## 2011-11-19 ENCOUNTER — Encounter: Payer: Medicare Other | Admitting: *Deleted

## 2011-11-26 IMAGING — CR DG CHEST 2V
2 series · 2 of 2 positions shown · non-contrast
Comparison: Chest radiograph performed 07/24/2010

CLINICAL DATA: Lower abdominal pain and swelling.

CHEST - 2 VIEW

[w chest pa]
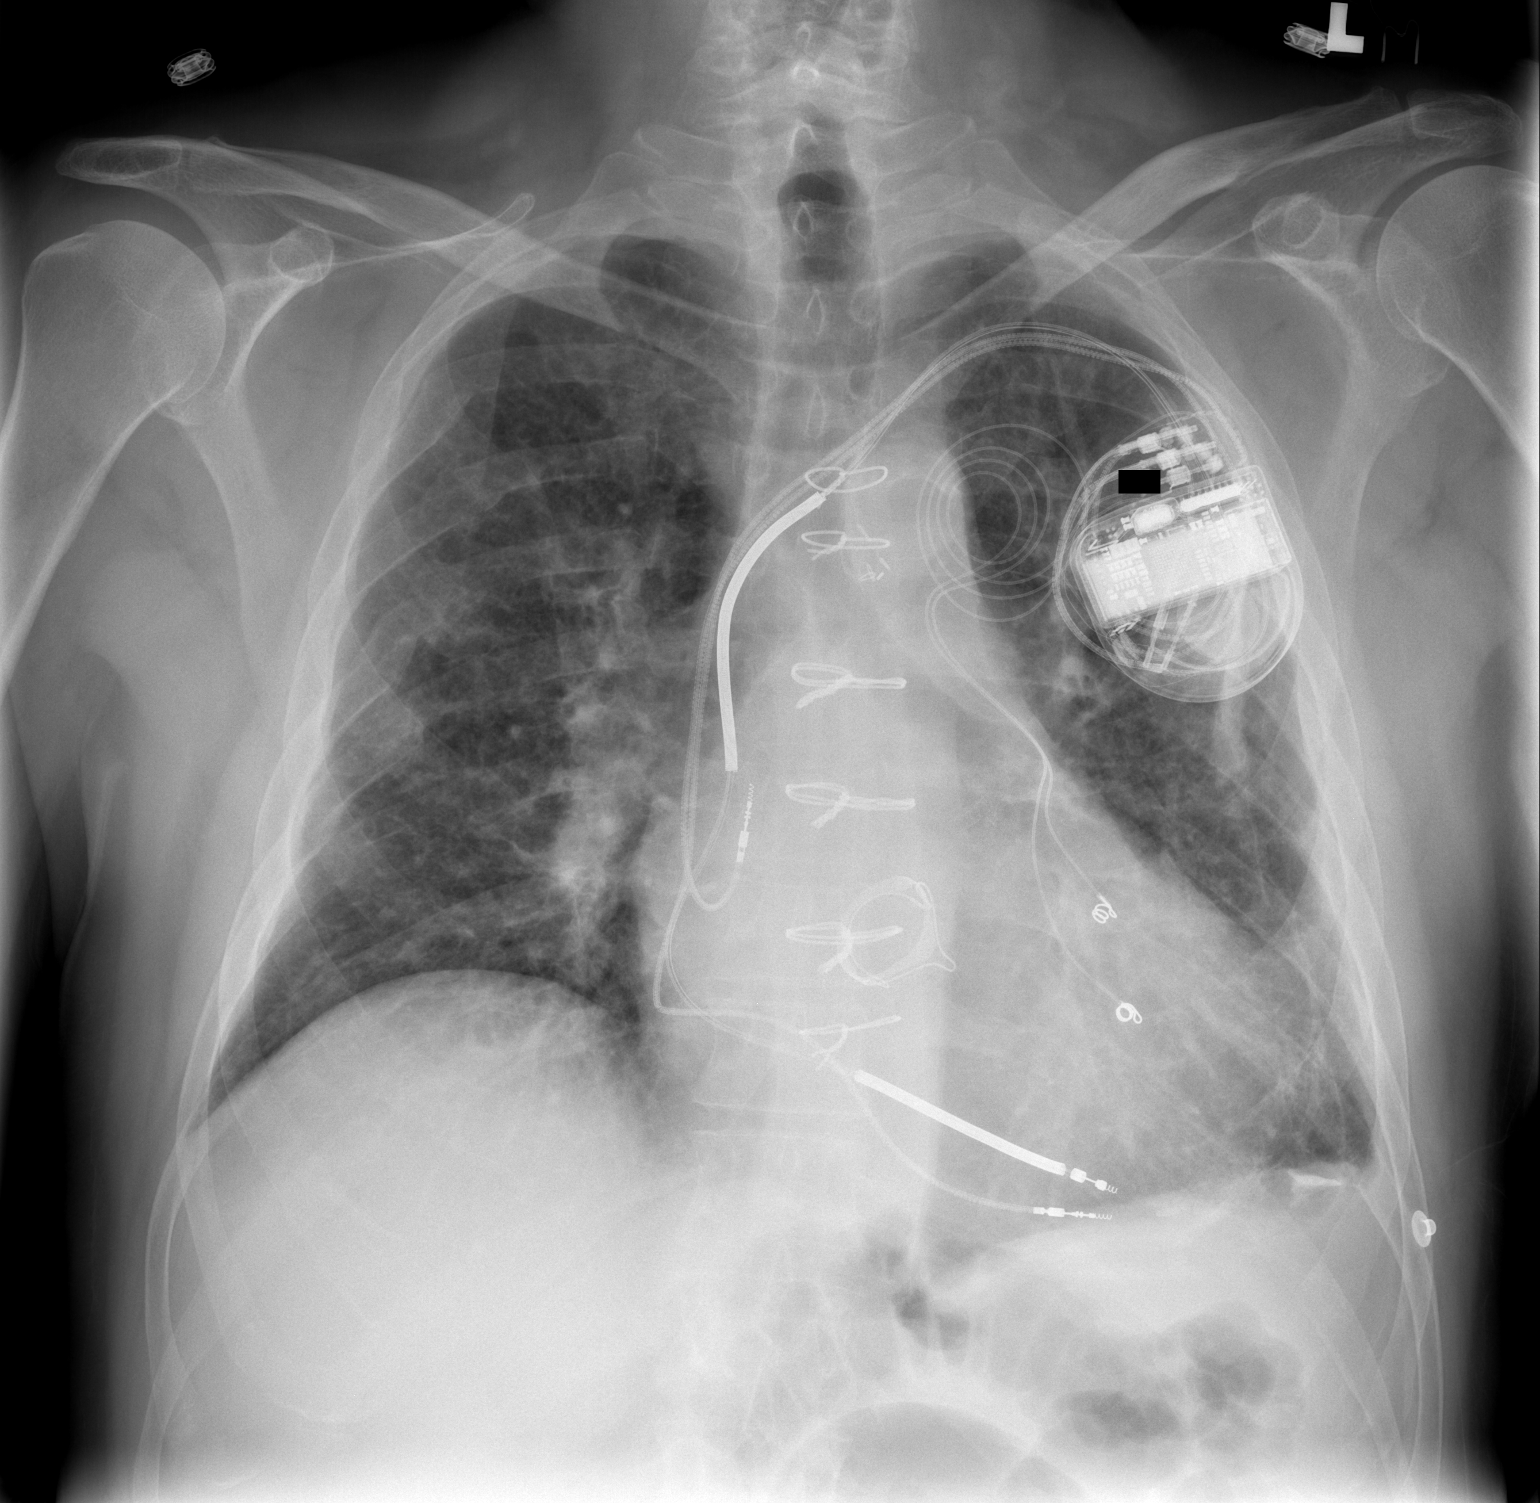

[w chest lat]
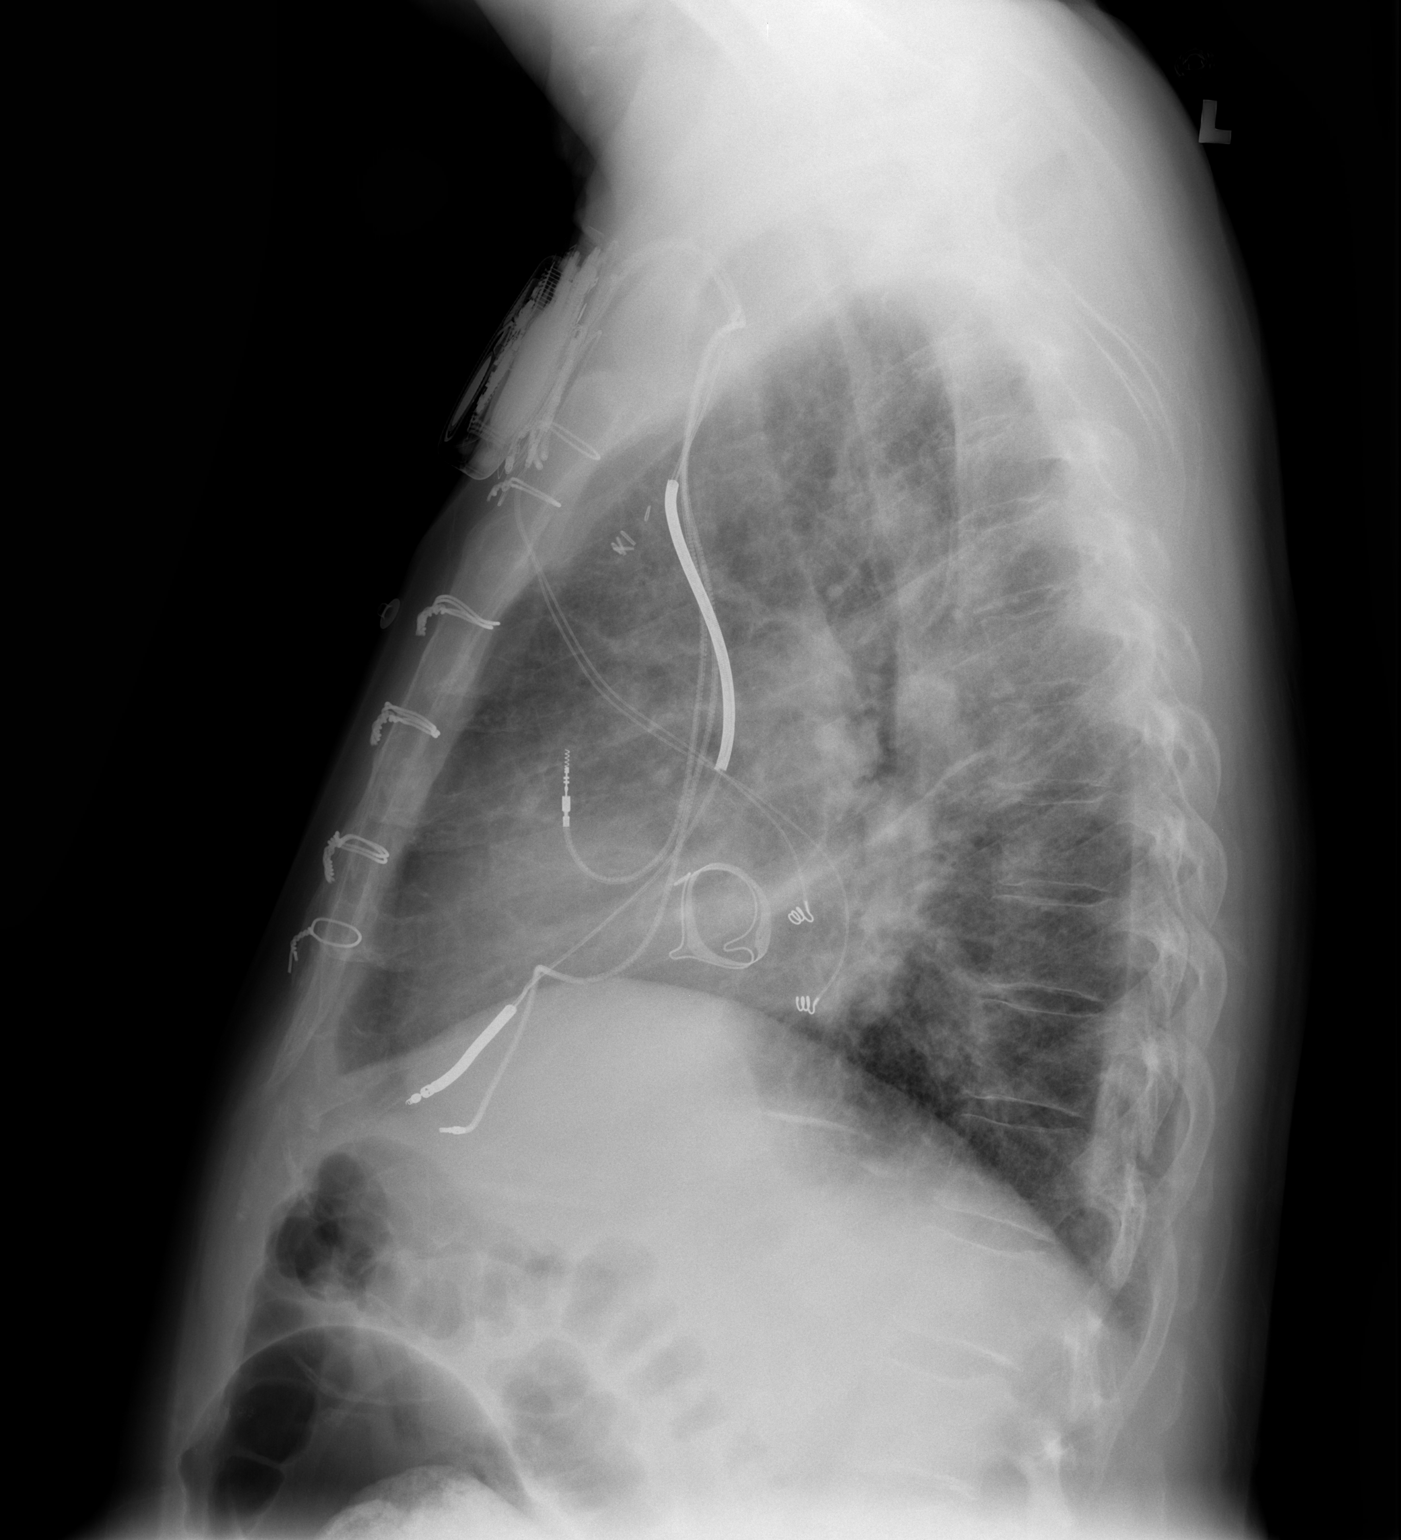

[2 of 2 positions shown; findings below may reference images not displayed]

FINDINGS: The lungs are well-aerated.  Scarring at the left midlung
zone has increased in prominence, without evidence of a focal mass.
Vascular congestion is seen, without significant pulmonary edema.
Chronic peribronchial thickening is noted.  Blunting of the left
costophrenic angle appears to reflect scarring.  There is no
evidence of pleural effusion or pneumothorax.

The heart is borderline normal in size; the patient is status post
median sternotomy.  A mitral valve replacement is noted.  The
patient's pacemaker/AICD is noted at the left chest wall, with
leads ending at the right atrium right ventricle.  Orphaned pacer
leads are also noted on the left side of the mediastinum.  No acute
osseous abnormalities are seen.
IMPRESSION: 1.  Vascular congestion, without significant pulmonary edema.
2.  Chronic lung changes noted, with left-sided scarring.
3.  Peribronchial thickening; no definite evidence of focal
consolidation.

## 2011-11-26 IMAGING — CR DG ABDOMEN 2V
2 series · 2 of 2 positions shown · non-contrast
Comparison: CT of the abdomen and pelvis performed 10/04/2007, and
pelvic radiograph performed 01/10/2009

CLINICAL DATA: Lower abdominal pain and swelling.

ABDOMEN - 2 VIEW

[w abdomen upright]
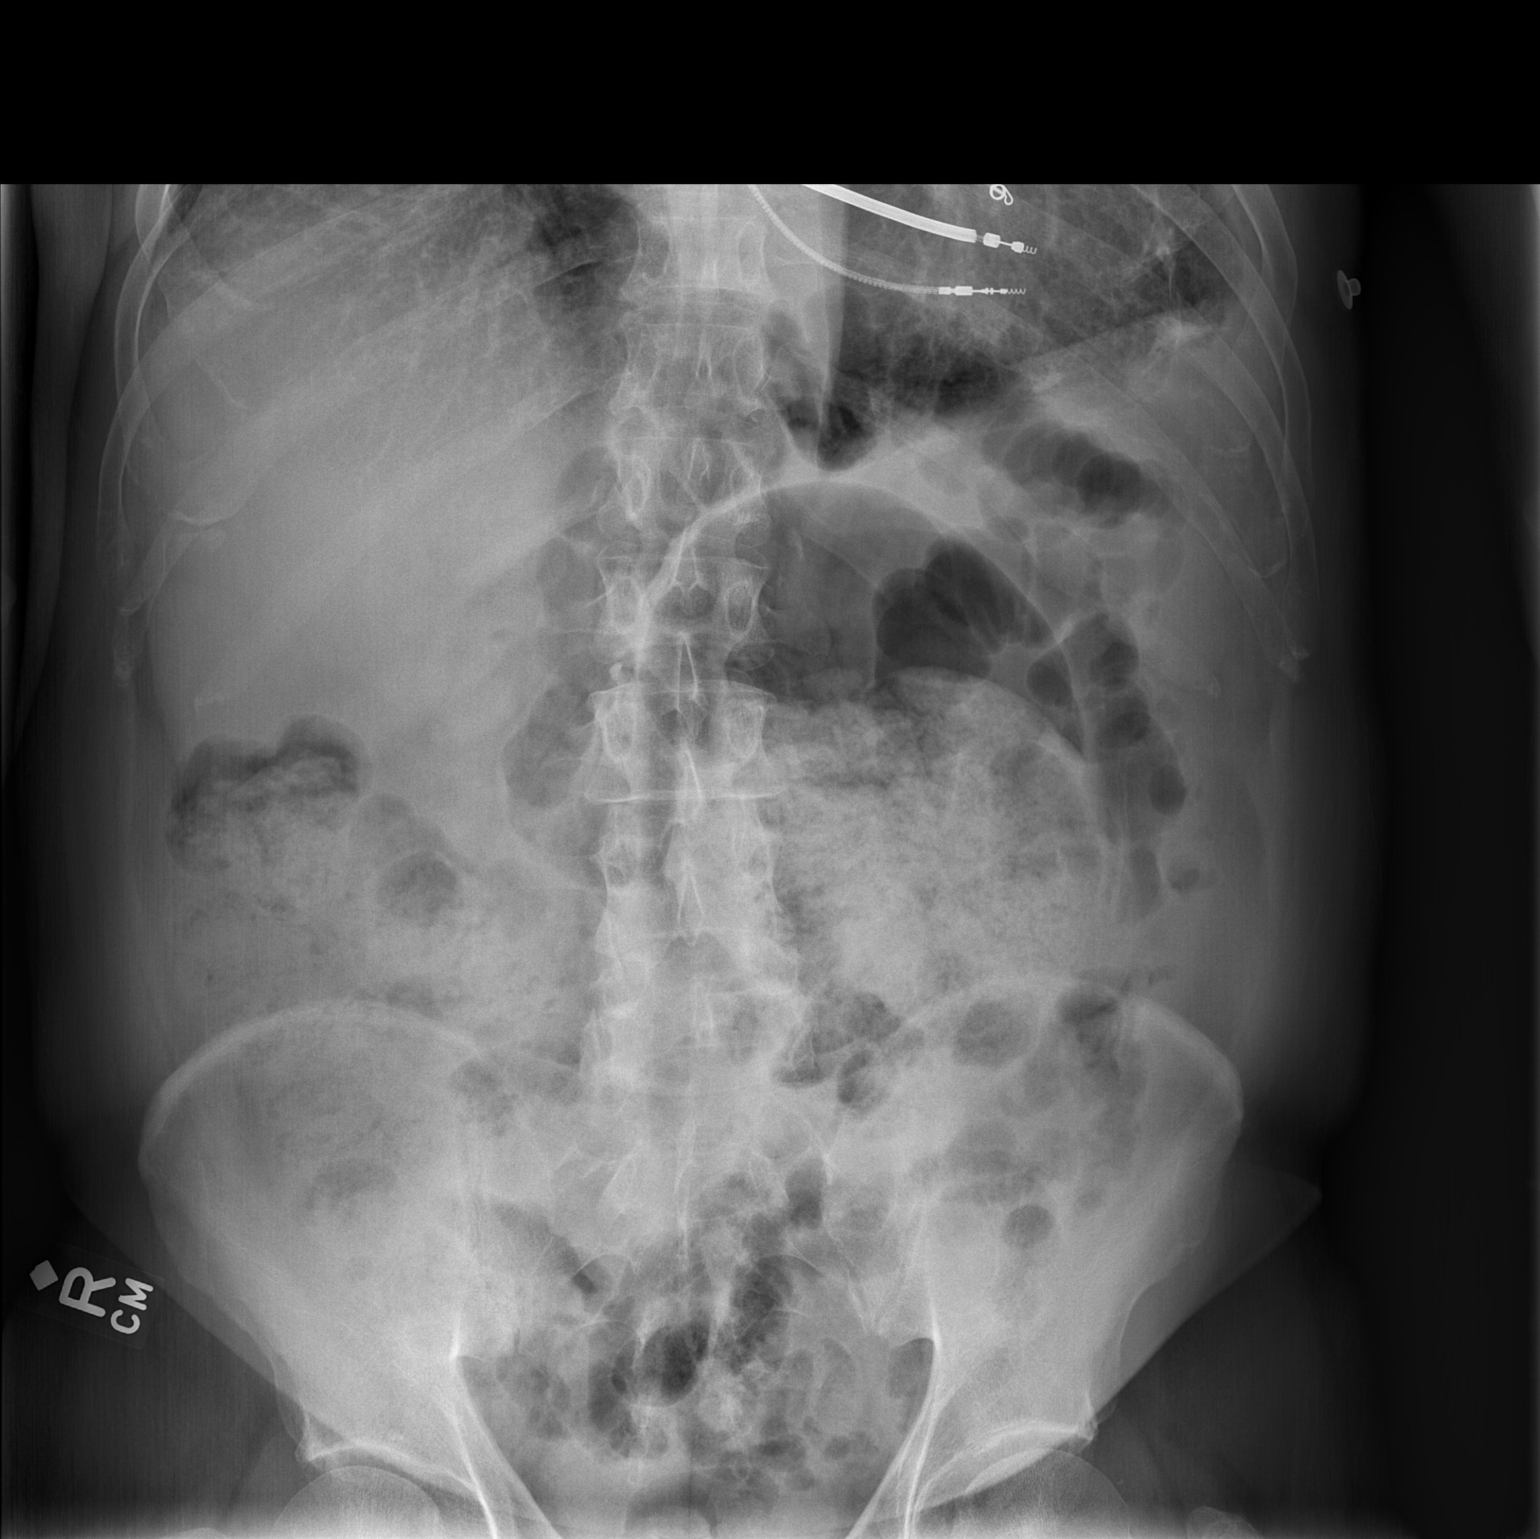

[t abdomen supine]
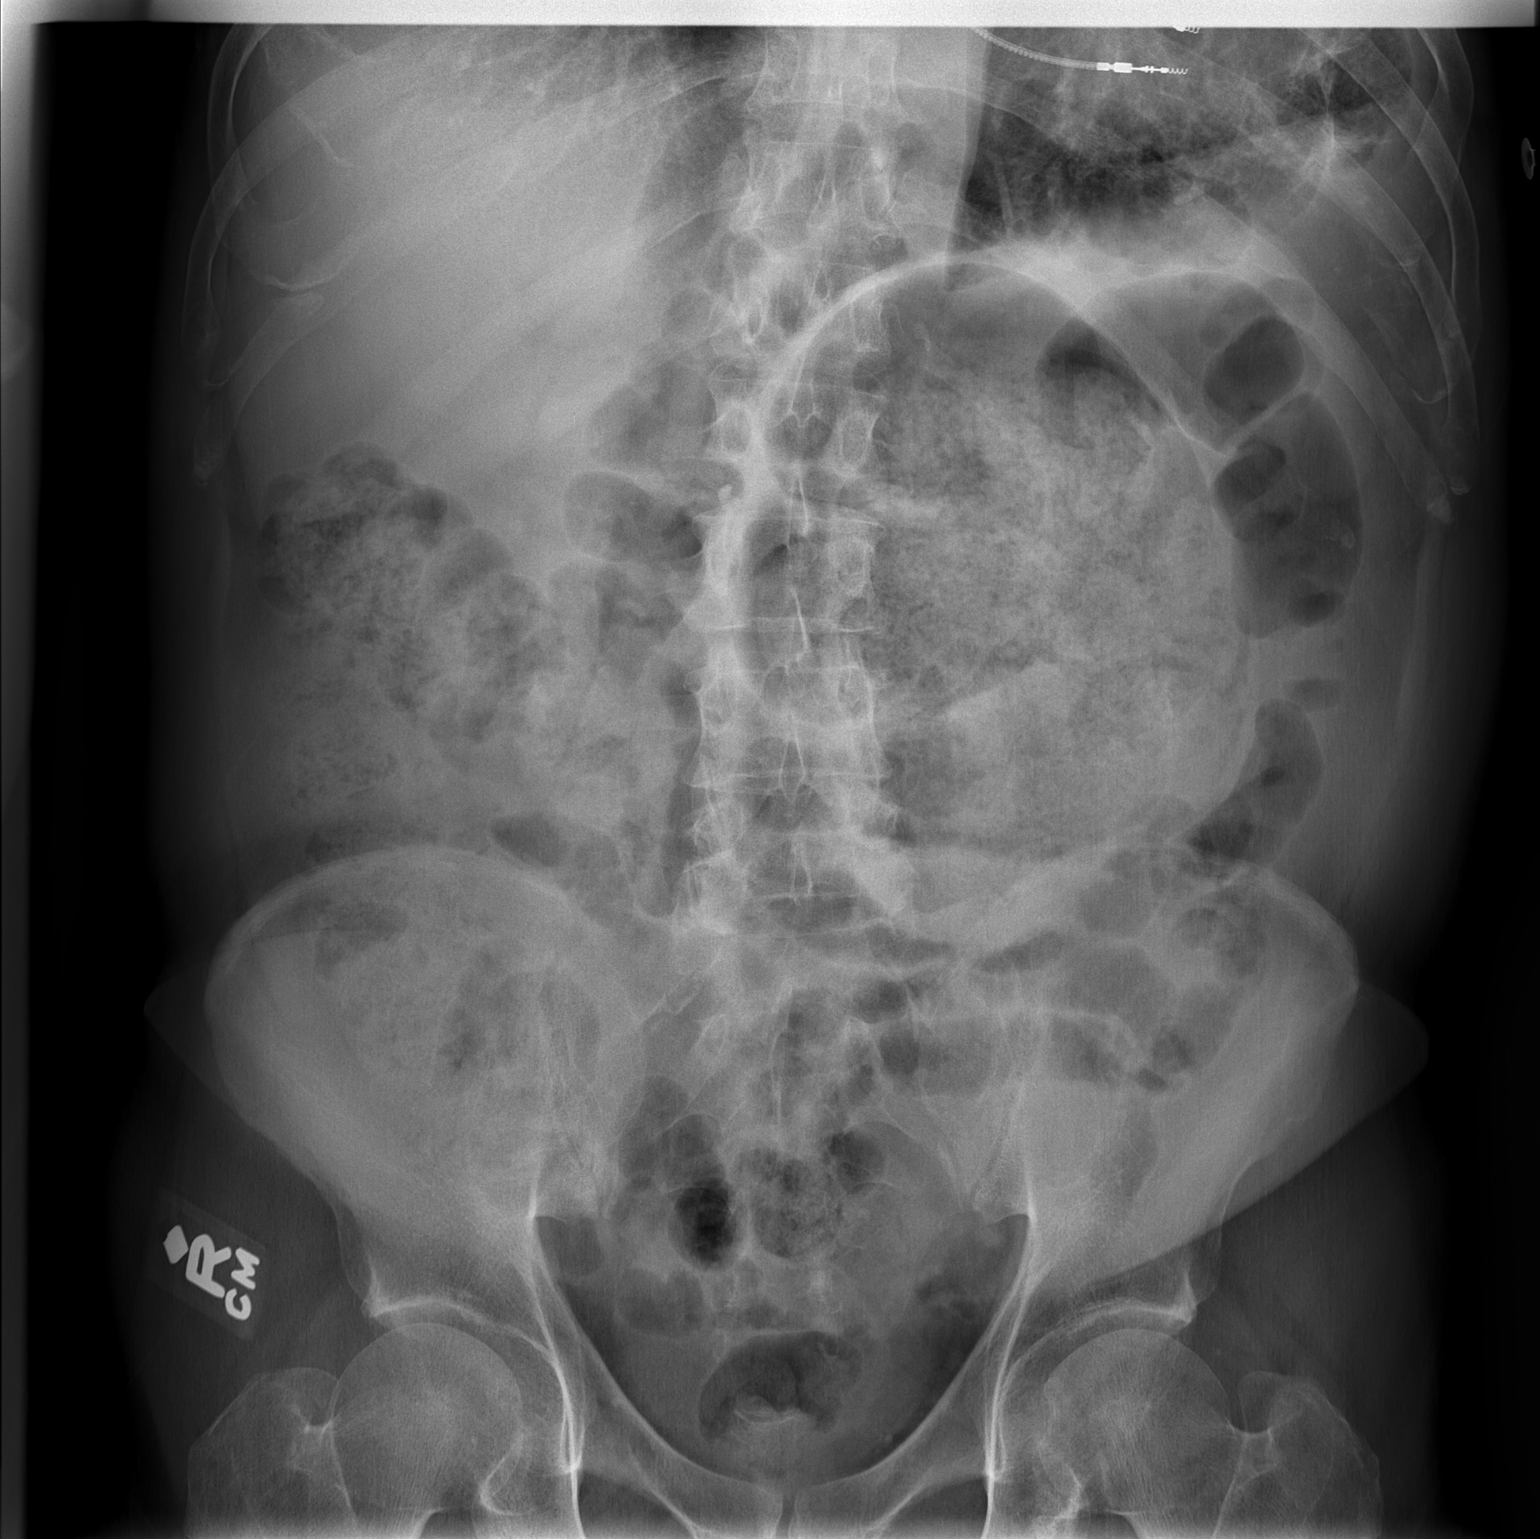

[2 of 2 positions shown; findings below may reference images not displayed]

FINDINGS: A large amount of stool is noted filling what seems to be
the sigmoid colon, which extends to the level of the left
hemidiaphragm.  This is compatible with constipation, without
definite evidence for volvulus.  Remaining large bowel loops are
filled with stool and air.  There is no evidence of dilatation of
small bowel loops to suggest small bowel obstruction.  No free
intra-abdominal air is identified on the provided upright view.

The visualized osseous structures are within normal limits; the
sacroiliac joints are unremarkable in appearance.  Chronic lung
changes are noted; pacer wires are partially imaged.
IMPRESSION: Large amount of stool seen filling the sigmoid colon, extending to
the level of the left hemidiaphragm; this is compatible with
constipation.  No definite evidence for distal obstruction.  No
free intra-abdominal air seen.

## 2011-11-30 ENCOUNTER — Encounter: Payer: Self-pay | Admitting: *Deleted

## 2012-01-12 ENCOUNTER — Telehealth: Payer: Self-pay | Admitting: Pulmonary Disease

## 2012-01-12 NOTE — Telephone Encounter (Signed)
Called pt to schedule follow up apt x3.Sent letter 01/04/12. ° °

## 2012-01-27 ENCOUNTER — Ambulatory Visit: Payer: Medicare Other | Admitting: Cardiology

## 2012-02-03 ENCOUNTER — Encounter: Payer: Self-pay | Admitting: *Deleted

## 2012-02-14 ENCOUNTER — Encounter (HOSPITAL_COMMUNITY): Payer: Self-pay | Admitting: Emergency Medicine

## 2012-02-14 ENCOUNTER — Emergency Department (HOSPITAL_COMMUNITY): Payer: Medicare Other

## 2012-02-14 ENCOUNTER — Emergency Department (HOSPITAL_COMMUNITY)
Admission: EM | Admit: 2012-02-14 | Discharge: 2012-02-14 | Disposition: A | Discharge status: Home / Self Care | Payer: Medicare Other | Attending: Emergency Medicine | Admitting: Emergency Medicine

## 2012-02-14 DIAGNOSIS — I10 Essential (primary) hypertension: Secondary | ICD-10-CM | POA: Insufficient documentation

## 2012-02-14 DIAGNOSIS — Z8673 Personal history of transient ischemic attack (TIA), and cerebral infarction without residual deficits: Secondary | ICD-10-CM | POA: Insufficient documentation

## 2012-02-14 DIAGNOSIS — I5022 Chronic systolic (congestive) heart failure: Secondary | ICD-10-CM | POA: Insufficient documentation

## 2012-02-14 DIAGNOSIS — R4182 Altered mental status, unspecified: Secondary | ICD-10-CM | POA: Insufficient documentation

## 2012-02-14 DIAGNOSIS — Z79899 Other long term (current) drug therapy: Secondary | ICD-10-CM | POA: Insufficient documentation

## 2012-02-14 DIAGNOSIS — F411 Generalized anxiety disorder: Secondary | ICD-10-CM | POA: Insufficient documentation

## 2012-02-14 DIAGNOSIS — F319 Bipolar disorder, unspecified: Secondary | ICD-10-CM | POA: Insufficient documentation

## 2012-02-14 DIAGNOSIS — R5381 Other malaise: Secondary | ICD-10-CM | POA: Insufficient documentation

## 2012-02-14 DIAGNOSIS — I509 Heart failure, unspecified: Secondary | ICD-10-CM | POA: Insufficient documentation

## 2012-02-14 DIAGNOSIS — I4891 Unspecified atrial fibrillation: Secondary | ICD-10-CM | POA: Insufficient documentation

## 2012-02-14 DIAGNOSIS — F10929 Alcohol use, unspecified with intoxication, unspecified: Secondary | ICD-10-CM

## 2012-02-14 DIAGNOSIS — E785 Hyperlipidemia, unspecified: Secondary | ICD-10-CM | POA: Insufficient documentation

## 2012-02-14 DIAGNOSIS — J449 Chronic obstructive pulmonary disease, unspecified: Secondary | ICD-10-CM | POA: Insufficient documentation

## 2012-02-14 DIAGNOSIS — F1010 Alcohol abuse, uncomplicated: Secondary | ICD-10-CM | POA: Insufficient documentation

## 2012-02-14 DIAGNOSIS — Z86718 Personal history of other venous thrombosis and embolism: Secondary | ICD-10-CM | POA: Insufficient documentation

## 2012-02-14 DIAGNOSIS — J4489 Other specified chronic obstructive pulmonary disease: Secondary | ICD-10-CM | POA: Insufficient documentation

## 2012-02-14 DIAGNOSIS — R5383 Other fatigue: Secondary | ICD-10-CM | POA: Insufficient documentation

## 2012-02-14 LAB — CBC
HCT: 45.9 % (ref 39.0–52.0)
MCHC: 32.7 g/dL (ref 30.0–36.0)
MCV: 90.2 fL (ref 78.0–100.0)
Platelets: 201 10*3/uL (ref 150–400)
RDW: 14.8 % (ref 11.5–15.5)
WBC: 7.6 10*3/uL (ref 4.0–10.5)

## 2012-02-14 LAB — RAPID URINE DRUG SCREEN, HOSP PERFORMED
Amphetamines: NOT DETECTED
Barbiturates: NOT DETECTED
Benzodiazepines: NOT DETECTED
Cocaine: NOT DETECTED
Opiates: NOT DETECTED
Tetrahydrocannabinol: NOT DETECTED

## 2012-02-14 LAB — COMPREHENSIVE METABOLIC PANEL
AST: 28 U/L (ref 0–37)
Albumin: 4 g/dL (ref 3.5–5.2)
BUN: 13 mg/dL (ref 6–23)
Calcium: 9.2 mg/dL (ref 8.4–10.5)
Creatinine, Ser: 0.92 mg/dL (ref 0.50–1.35)
Total Bilirubin: 0.3 mg/dL (ref 0.3–1.2)
Total Protein: 7.8 g/dL (ref 6.0–8.3)

## 2012-02-14 LAB — URINALYSIS, ROUTINE W REFLEX MICROSCOPIC
Bilirubin Urine: NEGATIVE
Glucose, UA: NEGATIVE mg/dL
Hgb urine dipstick: NEGATIVE
Protein, ur: NEGATIVE mg/dL

## 2012-02-14 MED ORDER — ALUM & MAG HYDROXIDE-SIMETH 200-200-20 MG/5ML PO SUSP: 30.0000 mL | ORAL | Status: DC | PRN

## 2012-02-14 MED ORDER — POTASSIUM CHLORIDE CRYS ER 20 MEQ PO TBCR
20.0000 meq | EXTENDED_RELEASE_TABLET | Freq: Every day | ORAL | Status: DC
  Administered 2012-02-14: 20 meq via ORAL
  Filled 2012-02-14: qty 1

## 2012-02-14 MED ORDER — LORAZEPAM 1 MG PO TABS: 1.0000 mg | ORAL_TABLET | Freq: Three times a day (TID) | ORAL | Status: DC | PRN

## 2012-02-14 MED ORDER — FUROSEMIDE 40 MG PO TABS
40.0000 mg | ORAL_TABLET | Freq: Two times a day (BID) | ORAL | Status: DC
  Filled 2012-02-14 (×2): qty 1

## 2012-02-14 MED ORDER — POTASSIUM CHLORIDE CRYS ER 20 MEQ PO TBCR: 20.0000 meq | EXTENDED_RELEASE_TABLET | Freq: Every day | ORAL | Status: DC

## 2012-02-14 MED ORDER — ONDANSETRON HCL 4 MG PO TABS: 4.0000 mg | ORAL_TABLET | Freq: Three times a day (TID) | ORAL | Status: DC | PRN

## 2012-02-14 MED ORDER — CARVEDILOL 12.5 MG PO TABS
12.5000 mg | ORAL_TABLET | Freq: Two times a day (BID) | ORAL | Status: DC
  Filled 2012-02-14 (×2): qty 1

## 2012-02-14 MED ORDER — ATORVASTATIN CALCIUM 40 MG PO TABS
40.0000 mg | ORAL_TABLET | Freq: Every day | ORAL | Status: DC
  Filled 2012-02-14: qty 1

## 2012-02-14 MED ORDER — FUROSEMIDE 40 MG PO TABS
40.0000 mg | ORAL_TABLET | Freq: Once | ORAL | Status: AC
  Administered 2012-02-14: 40 mg via ORAL
  Filled 2012-02-14: qty 1

## 2012-02-14 MED ORDER — CARVEDILOL 12.5 MG PO TABS: 12.5000 mg | ORAL_TABLET | Freq: Two times a day (BID) | ORAL | Status: DC

## 2012-02-14 MED ORDER — ATORVASTATIN CALCIUM 40 MG PO TABS: 40.0000 mg | ORAL_TABLET | Freq: Every day | ORAL | Status: DC

## 2012-02-14 MED ORDER — ACETAMINOPHEN 325 MG PO TABS: 650.0000 mg | ORAL_TABLET | ORAL | Status: DC | PRN

## 2012-02-14 MED ORDER — FUROSEMIDE 40 MG PO TABS: 40.0000 mg | ORAL_TABLET | Freq: Once | ORAL | Status: DC

## 2012-02-14 MED ORDER — IBUPROFEN 600 MG PO TABS: 600.0000 mg | ORAL_TABLET | Freq: Three times a day (TID) | ORAL | Status: DC | PRN
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
## 2012-04-04 ENCOUNTER — Encounter: Payer: Self-pay | Admitting: *Deleted

## 2012-04-04 ENCOUNTER — Encounter: Payer: Medicare Other | Admitting: Internal Medicine

## 2012-04-04 DIAGNOSIS — Z95810 Presence of automatic (implantable) cardiac defibrillator: Secondary | ICD-10-CM | POA: Insufficient documentation

## 2012-04-05 ENCOUNTER — Telehealth: Payer: Self-pay | Admitting: Internal Medicine

## 2012-04-05 ENCOUNTER — Encounter: Payer: Self-pay | Admitting: Internal Medicine

## 2012-04-05 NOTE — Telephone Encounter (Signed)
Spoke with pt's daughter, [name]. Pt woke up 1 am with chest discomfort on his left side near his heart.  Pt states he had never had chest discomfort like this before. It was not associated with any other symptoms and resolved on it's own after about 30 minutes.

## 2012-04-05 NOTE — Telephone Encounter (Signed)
I reviewed with Dr Patty Sermons. He did not have any new recommendations at the present time. He recommended pt report to ED if further symptoms. Pt advised,verbalized understanding.

## 2012-04-05 NOTE — Telephone Encounter (Signed)
plz return call to patient daughter [name] [phone].   Patient having chest discomfort, missed phsy Device ck on 10/28, would like a return call from nurse to advise of chest discomfort.

## 2012-04-05 NOTE — Telephone Encounter (Signed)
Pt states the pain was a 4-5 out of 10. Pt states he has not had any other symptoms since about 1AM  today.
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
## 2012-04-19 IMAGING — CR DG CHEST 1V PORT
1 series · 1 of 1 positions shown · non-contrast
Comparison: 07/12/2011

CLINICAL DATA: Check endotracheal tube position

PORTABLE CHEST - 1 VIEW

[AP]
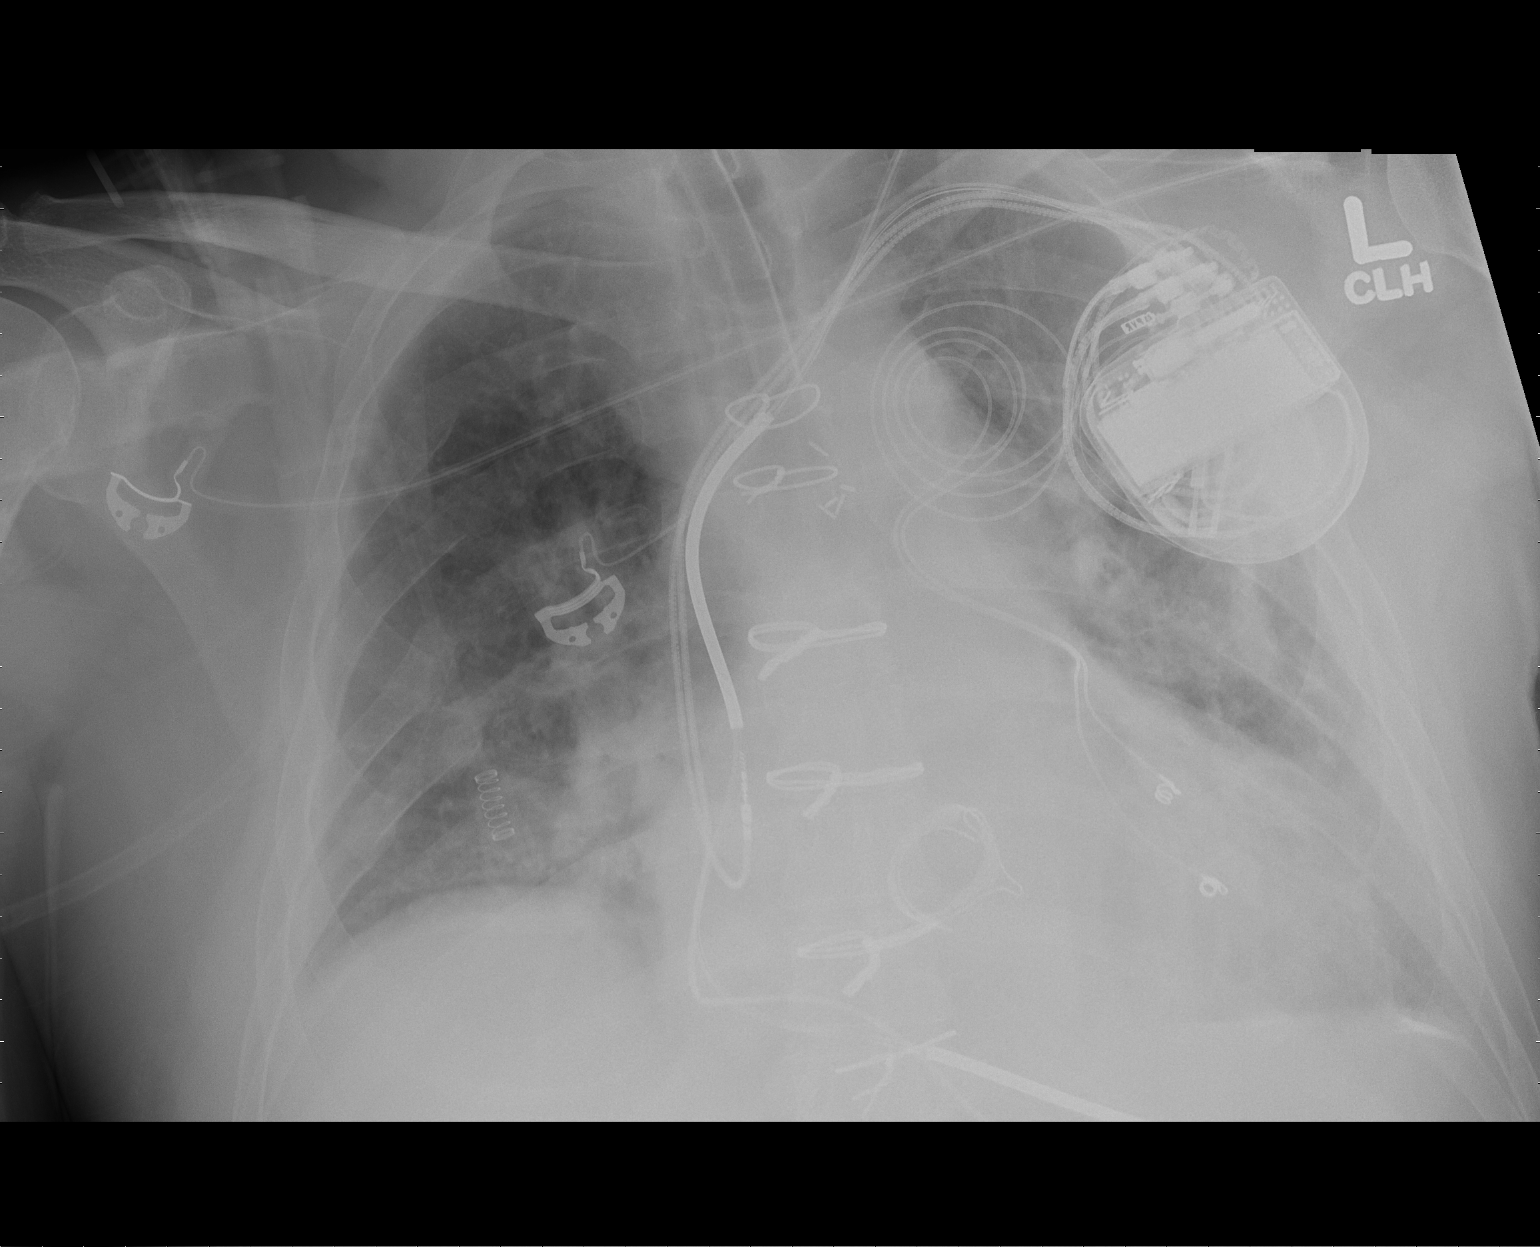

[1 of 1 positions shown; findings below may reference images not displayed]

FINDINGS: Cardiomediastinal silhouette is stable.  Three leads
cardiac pacemaker is unchanged in position.  Endotracheal tube in
place with tip 3.2 cm above the carina.  Stable bilateral airspace
disease or lung contusion.  Question trace right basilar
pneumothorax.  Calcified pleural plaque left base is stable. Stable
right rib fractures.
IMPRESSION: Endotracheal tube in place with tip 3.2 cm above the carina.
Stable bilateral airspace disease or lung contusion.  Question
trace right basilar pneumothorax.
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
## 2012-04-21 IMAGING — CR DG CHEST 1V PORT
1 series · 1 of 1 positions shown · non-contrast
Comparison: Portable chest x-ray of 07/14/2011

CLINICAL DATA: Evaluate endotracheal tube position, respiratory
failure

PORTABLE CHEST - 1 VIEW

[AP]
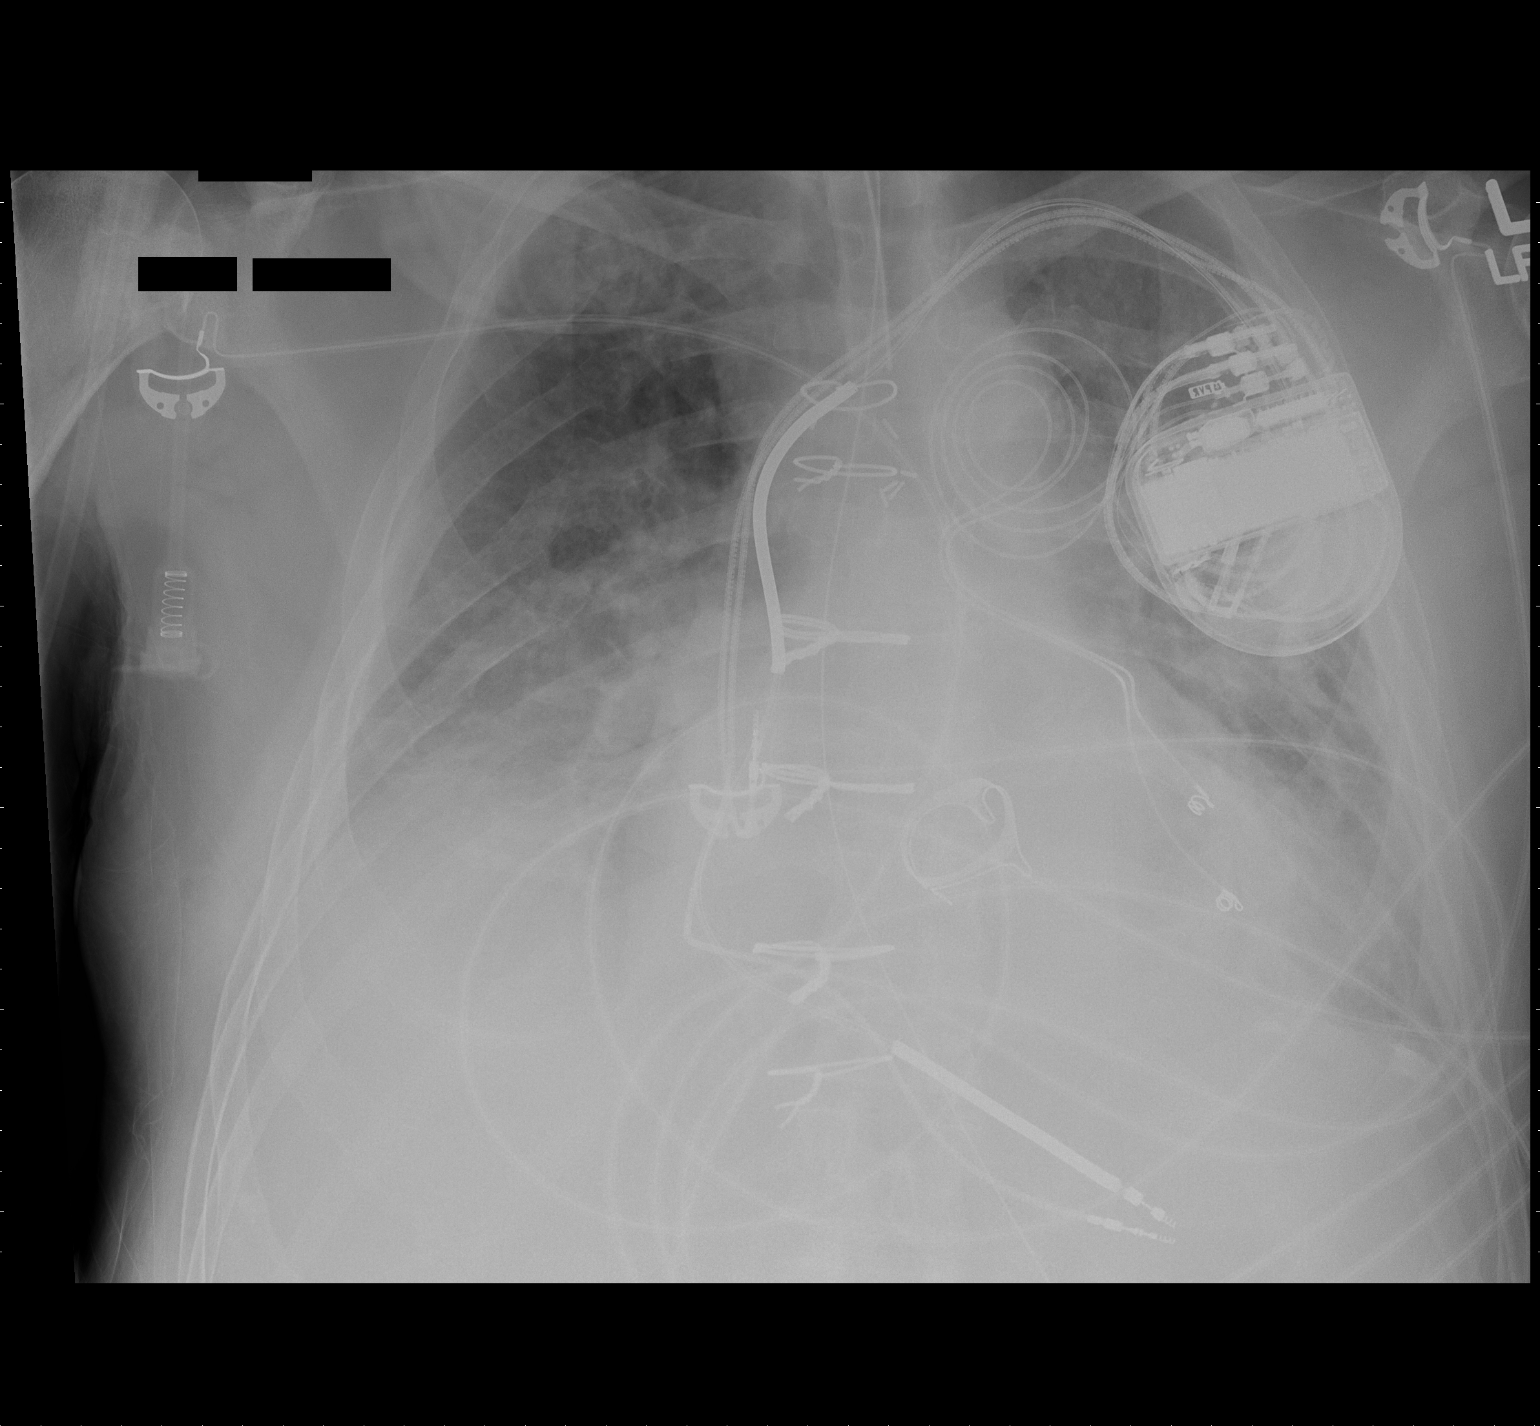

[1 of 1 positions shown; findings below may reference images not displayed]

FINDINGS: The tip of the endotracheal tube is approximately 2.9 cm
above the carina.  The lungs are poorly aerated.  There is basilar
atelectasis present with small effusions and cardiomegaly.  Also
there does appear to be pulmonary vascular congestion present as
well.  A permanent pacemaker with AICD lead remains.
IMPRESSION: 1.  Tip of endotracheal tube 2.9 cm above the carina.
2.  For aspiration with cardiomegaly, effusions, and pulmonary
vascular congestion.
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
## 2012-05-03 ENCOUNTER — Ambulatory Visit: Payer: Medicare Other | Admitting: Cardiology

## 2012-05-13 IMAGING — CR DG CHEST 2V
2 series · 2 of 2 positions shown · non-contrast
Comparison: Plain films of the chest 02/18/2011 and 07/24/2011.  CT
chest 04/07/2010.

CLINICAL DATA: Shortness of breath.

CHEST - 2 VIEW

[view not recorded (1 of 2)]
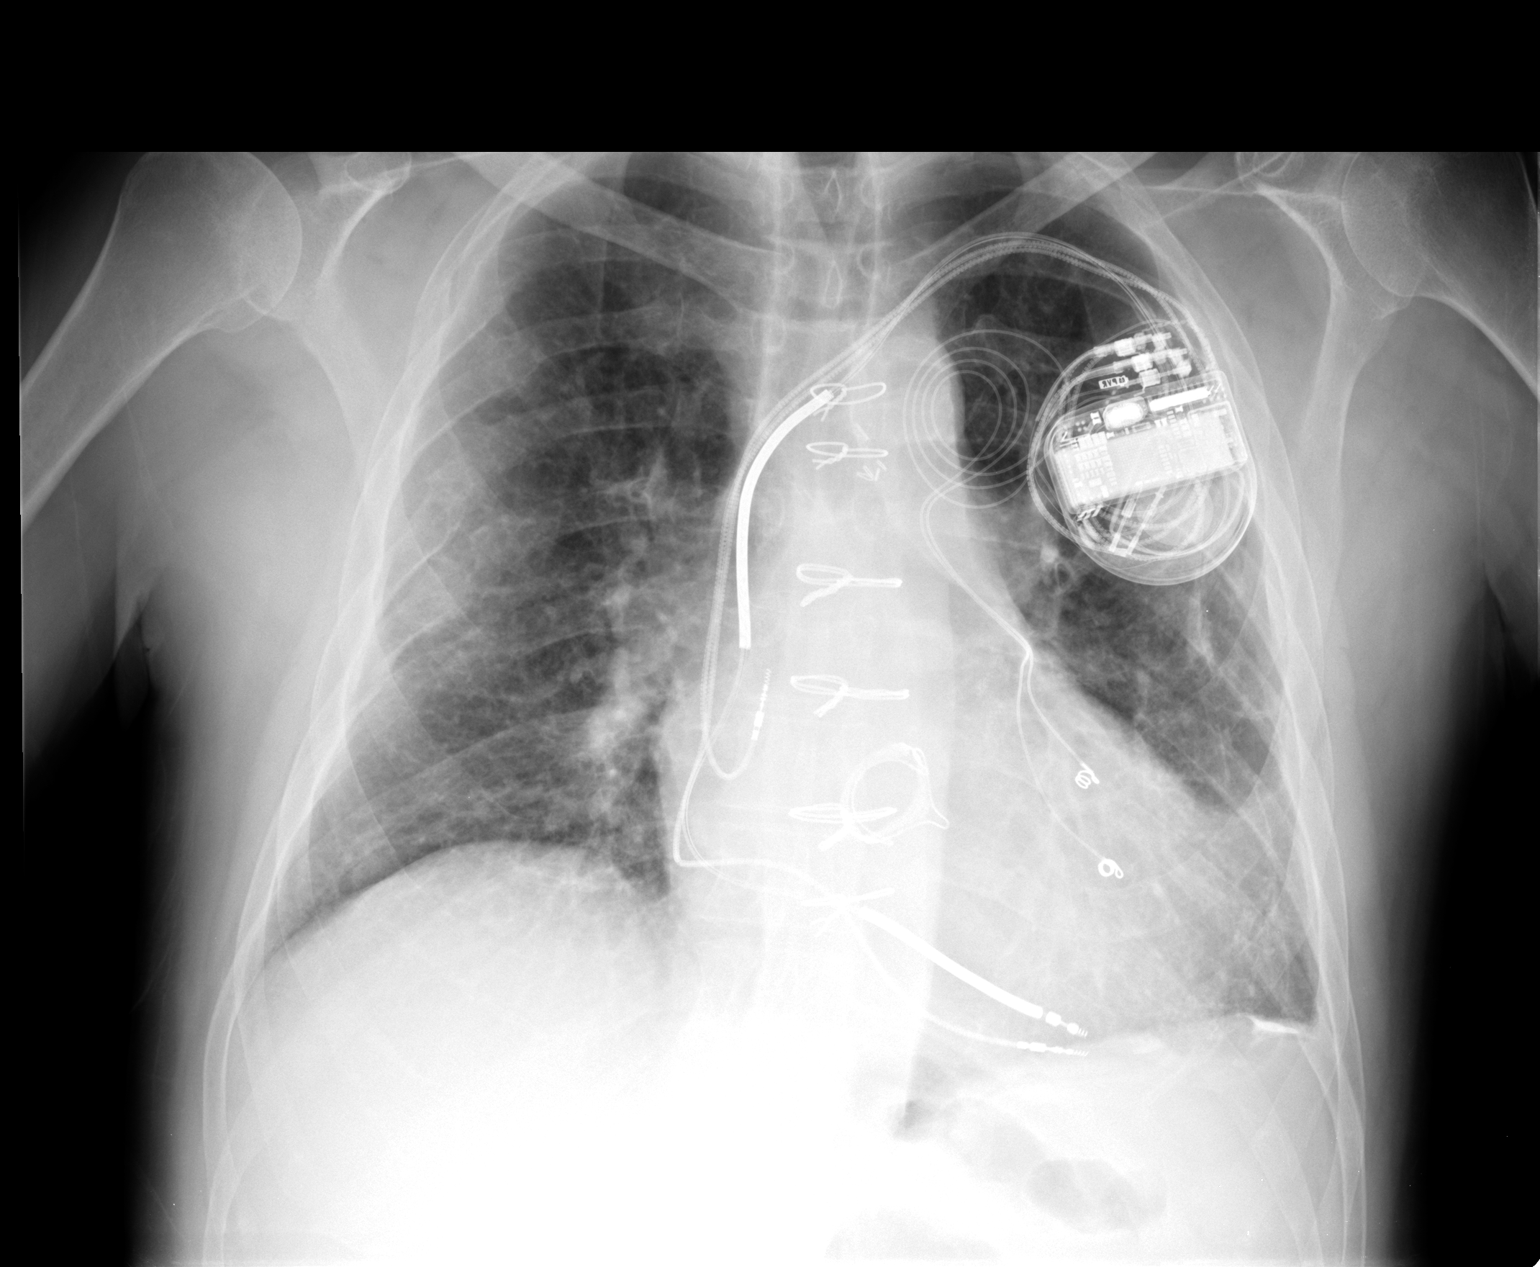

[view not recorded (2 of 2)]
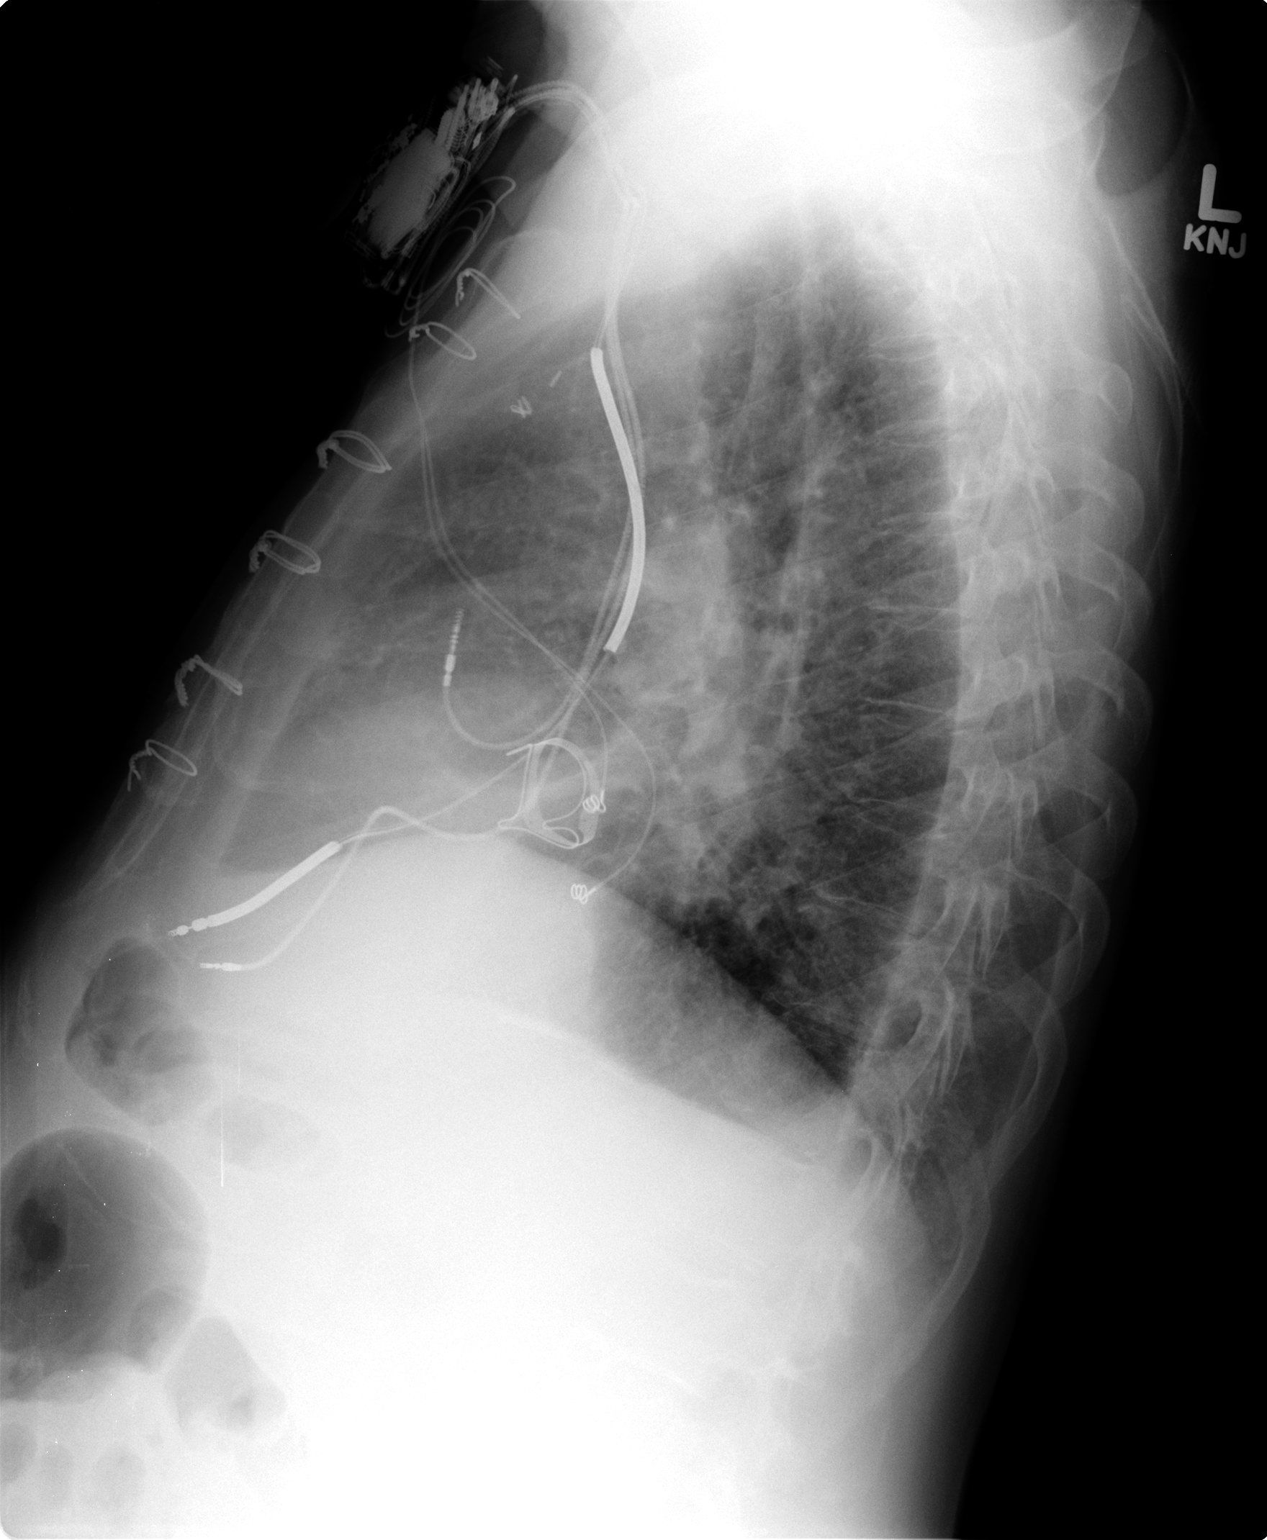

[2 of 2 positions shown; findings below may reference images not displayed]

FINDINGS: There is cardiomegaly without pulmonary edema.  The
patient is status post CABG and aortic valve replacement.  AICD is
in place.  Pleural plaque over the left hemidiaphragm is noted.
There is no pneumothorax or pleural effusion.  Remote right rib
fractures are seen.
IMPRESSION: 1.  No acute finding.
2.  Cardiomegaly.
3.  Status post CABG and aortic valve replacement.
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
## 2012-06-10 ENCOUNTER — Encounter: Payer: Self-pay | Admitting: *Deleted

## 2012-06-13 ENCOUNTER — Inpatient Hospital Stay (HOSPITAL_COMMUNITY)
Admission: EM | Admit: 2012-06-13 | Discharge: 2012-06-15 | DRG: 292 | Disposition: A | Discharge status: Home / Self Care | Payer: Medicare PPO | Attending: Internal Medicine | Admitting: Internal Medicine

## 2012-06-13 ENCOUNTER — Emergency Department (HOSPITAL_COMMUNITY): Payer: Medicare PPO

## 2012-06-13 ENCOUNTER — Encounter (HOSPITAL_COMMUNITY): Payer: Self-pay | Admitting: Emergency Medicine

## 2012-06-13 DIAGNOSIS — I5023 Acute on chronic systolic (congestive) heart failure: Principal | ICD-10-CM | POA: Diagnosis present

## 2012-06-13 DIAGNOSIS — Z9119 Patient's noncompliance with other medical treatment and regimen: Secondary | ICD-10-CM

## 2012-06-13 DIAGNOSIS — S2239XA Fracture of one rib, unspecified side, initial encounter for closed fracture: Secondary | ICD-10-CM

## 2012-06-13 DIAGNOSIS — I442 Atrioventricular block, complete: Secondary | ICD-10-CM

## 2012-06-13 DIAGNOSIS — Z7901 Long term (current) use of anticoagulants: Secondary | ICD-10-CM

## 2012-06-13 DIAGNOSIS — E785 Hyperlipidemia, unspecified: Secondary | ICD-10-CM | POA: Diagnosis present

## 2012-06-13 DIAGNOSIS — Z7982 Long term (current) use of aspirin: Secondary | ICD-10-CM

## 2012-06-13 DIAGNOSIS — Z86718 Personal history of other venous thrombosis and embolism: Secondary | ICD-10-CM

## 2012-06-13 DIAGNOSIS — I513 Intracardiac thrombosis, not elsewhere classified: Secondary | ICD-10-CM

## 2012-06-13 DIAGNOSIS — Z954 Presence of other heart-valve replacement: Secondary | ICD-10-CM

## 2012-06-13 DIAGNOSIS — F3130 Bipolar disorder, current episode depressed, mild or moderate severity, unspecified: Secondary | ICD-10-CM | POA: Diagnosis present

## 2012-06-13 DIAGNOSIS — I4891 Unspecified atrial fibrillation: Secondary | ICD-10-CM | POA: Diagnosis present

## 2012-06-13 DIAGNOSIS — I5022 Chronic systolic (congestive) heart failure: Secondary | ICD-10-CM

## 2012-06-13 DIAGNOSIS — F411 Generalized anxiety disorder: Secondary | ICD-10-CM | POA: Diagnosis present

## 2012-06-13 DIAGNOSIS — F1010 Alcohol abuse, uncomplicated: Secondary | ICD-10-CM | POA: Diagnosis present

## 2012-06-13 DIAGNOSIS — S37009A Unspecified injury of unspecified kidney, initial encounter: Secondary | ICD-10-CM

## 2012-06-13 DIAGNOSIS — Z87891 Personal history of nicotine dependence: Secondary | ICD-10-CM

## 2012-06-13 DIAGNOSIS — R0989 Other specified symptoms and signs involving the circulatory and respiratory systems: Secondary | ICD-10-CM

## 2012-06-13 DIAGNOSIS — Z23 Encounter for immunization: Secondary | ICD-10-CM

## 2012-06-13 DIAGNOSIS — I509 Heart failure, unspecified: Secondary | ICD-10-CM | POA: Diagnosis present

## 2012-06-13 DIAGNOSIS — Z91199 Patient's noncompliance with other medical treatment and regimen due to unspecified reason: Secondary | ICD-10-CM

## 2012-06-13 DIAGNOSIS — I10 Essential (primary) hypertension: Secondary | ICD-10-CM | POA: Diagnosis present

## 2012-06-13 DIAGNOSIS — Z8673 Personal history of transient ischemic attack (TIA), and cerebral infarction without residual deficits: Secondary | ICD-10-CM

## 2012-06-13 DIAGNOSIS — I472 Ventricular tachycardia: Secondary | ICD-10-CM

## 2012-06-13 DIAGNOSIS — I2510 Atherosclerotic heart disease of native coronary artery without angina pectoris: Secondary | ICD-10-CM | POA: Diagnosis present

## 2012-06-13 DIAGNOSIS — Z8249 Family history of ischemic heart disease and other diseases of the circulatory system: Secondary | ICD-10-CM

## 2012-06-13 DIAGNOSIS — Z79899 Other long term (current) drug therapy: Secondary | ICD-10-CM

## 2012-06-13 DIAGNOSIS — Z95810 Presence of automatic (implantable) cardiac defibrillator: Secondary | ICD-10-CM

## 2012-06-13 DIAGNOSIS — Z951 Presence of aortocoronary bypass graft: Secondary | ICD-10-CM

## 2012-06-13 DIAGNOSIS — J982 Interstitial emphysema: Secondary | ICD-10-CM

## 2012-06-13 LAB — CBC WITH DIFFERENTIAL/PLATELET
Basophils Relative: 0 % (ref 0–1)
Eosinophils Absolute: 0.2 10*3/uL (ref 0.0–0.7)
Hemoglobin: 15.2 g/dL (ref 13.0–17.0)
Lymphs Abs: 1.6 10*3/uL (ref 0.7–4.0)
MCH: 30.5 pg (ref 26.0–34.0)
MCHC: 33.8 g/dL (ref 30.0–36.0)
Neutro Abs: 8.6 10*3/uL — ABNORMAL HIGH (ref 1.7–7.7)
Neutrophils Relative %: 73 % (ref 43–77)
Platelets: 186 10*3/uL (ref 150–400)
RBC: 4.99 MIL/uL (ref 4.22–5.81)

## 2012-06-13 LAB — COMPREHENSIVE METABOLIC PANEL
ALT: 15 U/L (ref 0–53)
AST: 24 U/L (ref 0–37)
Albumin: 4.1 g/dL (ref 3.5–5.2)
Alkaline Phosphatase: 107 U/L (ref 39–117)
Chloride: 101 mEq/L (ref 96–112)
Potassium: 4.1 mEq/L (ref 3.5–5.1)
Sodium: 140 mEq/L (ref 135–145)
Total Bilirubin: 1.4 mg/dL — ABNORMAL HIGH (ref 0.3–1.2)
Total Protein: 8.6 g/dL — ABNORMAL HIGH (ref 6.0–8.3)

## 2012-06-13 MED ORDER — SODIUM CHLORIDE 0.9 % IV SOLN
INTRAVENOUS | Status: DC
  Administered 2012-06-13: 21:00:00 via INTRAVENOUS

## 2012-06-13 MED ORDER — FUROSEMIDE 10 MG/ML IJ SOLN
60.0000 mg | Freq: Once | INTRAMUSCULAR | Status: AC
  Administered 2012-06-13: 60 mg via INTRAVENOUS
  Filled 2012-06-13: qty 6

## 2012-06-13 NOTE — ED Provider Notes (Signed)
History     CSN: [id number]  Arrival date & time 06/13/12  1928   First MD Initiated Contact with Patient 06/13/12 2049      Chief Complaint  Patient presents with  . Shortness of Breath    (Consider location/radiation/quality/duration/timing/severity/associated sxs/prior treatment) Patient is a 67 y.o. male presenting with shortness of breath. The history is provided by the patient.  Shortness of Breath  Associated symptoms include shortness of breath.   patient here complaining of shortness of breath times many weeks worse for the past 2 days. Patient notes orthopnea and dyspnea exertion. Does have a history of chronic systolic congestive heart failure has been noncompliant with his medications for the past month. Denies any anginal type chest pain. No fever or chills. No peripheral edema.  Past Medical History  Diagnosis Date  . Stroke   . DVT (deep venous thrombosis)   . 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 COPD (chronic obstructive pulmonary disease)   . Depression   . Hypertension   . Dyslipidemia   . Ventral hernia   . CAD (coronary artery disease)   . CVA (cerebral vascular accident)     multiple prior embolic strokes  . Hernia   . Chronic systolic congestive heart failure   . Bipolar disorder   . Anxiety   . Atrial fibrillation     persistent  . Alcohol abuse   . Complete heart block     s/p prior AV nodal ablation    Past Surgical History  Procedure Date  . Cystoscopy w/ ureteral stent placement 07/12/2011    Procedure: CYSTOSCOPY WITH RETROGRADE PYELOGRAM/URETERAL STENT PLACEMENT;  Surgeon: Sebastian Ache, MD;  Location: [facility] OR;  Service: Urology;  Laterality: Left;  . Coronary artery bypass graft   . Hernia repair   . Mitral valve replacement     remote [facility] valve 7829 with redo tissue valve 06/2006  . Pacemaker placement   . Av node ablation     Family History  Problem  Relation Age of Onset  . Stroke    . Heart disease    . Alzheimer's disease Mother   . Heart attack Father     History  Substance Use Topics  . Smoking status: Former Smoker -- 2.0 packs/day    Types: Cigarettes    Quit date: 01/30/2003  . Smokeless tobacco: Never Used  . Alcohol Use: No     Comment: occasionally      Review of Systems  Respiratory: Positive for shortness of breath.   All other systems reviewed and are negative.    Allergies  Review of patient's allergies indicates no known allergies.  Home Medications   Current Outpatient Rx  Name  Route  Sig  Dispense  Refill  . ASPIRIN 81 MG PO TABS   Oral   Take 81 mg by mouth daily.           . ATORVASTATIN CALCIUM 40 MG PO TABS   Oral   Take 40 mg by mouth daily.         Marland Kitchen CARVEDILOL 6.25 MG PO TABS   Oral   Take 6.25 mg by mouth 2 (two) times daily with a meal.         . FUROSEMIDE 40 MG PO TABS   Oral   Take 40 mg by mouth daily.         Marland Kitchen POTASSIUM CHLORIDE  CRYS ER 20 MEQ PO TBCR   Oral   Take 20 mEq by mouth 2 (two) times daily.           BP 169/88  Pulse 63  Temp 97.4 F (36.3 C) (Oral)  Resp 18  SpO2 94%  Physical Exam  Nursing note and vitals reviewed. Constitutional: He is oriented to person, place, and time. He appears well-developed and well-nourished.  Non-toxic appearance. No distress.  HENT:  Head: Normocephalic and atraumatic.  Eyes: Conjunctivae normal, EOM and lids are normal. Pupils are equal, round, and reactive to light.  Neck: Normal range of motion. Neck supple. No tracheal deviation present. No mass present.  Cardiovascular: Normal rate, regular rhythm and normal heart sounds.  Exam reveals no gallop.   No murmur heard. Pulmonary/Chest: Effort normal. No stridor. No respiratory distress. He has decreased breath sounds. He has no wheezes. He has rhonchi. He has no rales.  Abdominal: Soft. Normal appearance and bowel sounds are normal. He exhibits no  distension. There is no tenderness. There is no rebound and no CVA tenderness.  Musculoskeletal: Normal range of motion. He exhibits no edema and no tenderness.  Neurological: He is alert and oriented to person, place, and time. He has normal strength. No cranial nerve deficit or sensory deficit. GCS eye subscore is 4. GCS verbal subscore is 5. GCS motor subscore is 6.  Skin: Skin is warm and dry. No abrasion and no rash noted.  Psychiatric: He has a normal mood and affect. His speech is normal and behavior is normal.    ED Course  Procedures (including critical care time)  Labs Reviewed  CBC WITH DIFFERENTIAL - Abnormal; Notable for the following:    WBC 11.8 (*)     Neutro Abs 8.6 (*)     Monocytes Absolute 1.4 (*)     All other components within normal limits  COMPREHENSIVE METABOLIC PANEL - Abnormal; Notable for the following:    Total Protein 8.6 (*)     Total Bilirubin 1.4 (*)     GFR calc non Af Amer 88 (*)     All other components within normal limits  PRO B NATRIURETIC PEPTIDE - Abnormal; Notable for the following:    Pro B Natriuretic peptide (BNP) 3949.0 (*)     All other components within normal limits  POCT I-STAT TROPONIN I   Dg Chest 2 View  06/13/2012  *RADIOLOGY REPORT*  Clinical Data: Short of breath.  Congestion.  CHEST - 2 VIEW  Comparison: 06/13/2012, 1809 hours.  Findings: No interval change compared to prior exam.  Cardiomegaly and pulmonary vascular congestion similar.  Support apparatus also appears similar.  IMPRESSION:  No interval change.  Cardiomegaly and pulmonary vascular congestion.   Original Report Authenticated By: Andreas Newport, M.D.      No diagnosis found.    MDM   Date: 06/13/2012  Rate: 62  Rhythm: normal sinus rhythm  QRS Axis: normal  Intervals: normal  ST/T Wave abnormalities: nonspecific ST changes  Conduction Disutrbances:Paced rhythm  Narrative Interpretation:   Old EKG Reviewed: none available   Patient given Lasix 60 mg IV  push for his CHF will be admitted to [facility]          Toy Baker, MD 06/13/12 2108

## 2012-06-13 NOTE — ED Notes (Signed)
Pt taken by stretcher to floor with monitor.

## 2012-06-13 NOTE — ED Notes (Signed)
Patient was seen by his PCP and had chest x-rays done due to shortness of breath; radiologist states that patient has fluid in his lungs and needed to come to the ED immediately.  Patient reports shortness of breath and fatigue for the last three weeks.  Patient denies chest pain.  Patient has pacemaker; reports that he has not been taking ANY of his prescription medications for months.  History of MI, DVT, CAD, CVA, stroke, and etc.

## 2012-06-14 ENCOUNTER — Encounter (HOSPITAL_COMMUNITY): Payer: Self-pay | Admitting: General Practice

## 2012-06-14 DIAGNOSIS — I4891 Unspecified atrial fibrillation: Secondary | ICD-10-CM

## 2012-06-14 DIAGNOSIS — I10 Essential (primary) hypertension: Secondary | ICD-10-CM | POA: Diagnosis present

## 2012-06-14 DIAGNOSIS — I5023 Acute on chronic systolic (congestive) heart failure: Secondary | ICD-10-CM | POA: Diagnosis present

## 2012-06-14 DIAGNOSIS — I509 Heart failure, unspecified: Secondary | ICD-10-CM

## 2012-06-14 LAB — CBC
MCH: 29.6 pg (ref 26.0–34.0)
MCHC: 34.4 g/dL (ref 30.0–36.0)
MCHC: 32.7 g/dL (ref 30.0–36.0)
Platelets: 184 10*3/uL (ref 150–400)
Platelets: 201 10*3/uL (ref 150–400)
RDW: 14.6 % (ref 11.5–15.5)

## 2012-06-14 LAB — BASIC METABOLIC PANEL
Calcium: 9.7 mg/dL (ref 8.4–10.5)
GFR calc non Af Amer: 75 mL/min — ABNORMAL LOW (ref >90)
Glucose, Bld: 103 mg/dL — ABNORMAL HIGH (ref 70–99)
Sodium: 138 mEq/L (ref 135–145)

## 2012-06-14 LAB — TROPONIN I
Troponin I: <0.3 ng/mL (ref <0.30)
Troponin I: <0.3 ng/mL (ref <0.30)

## 2012-06-14 LAB — CREATININE, SERUM
Creatinine, Ser: 0.97 mg/dL (ref 0.50–1.35)
GFR calc non Af Amer: 85 mL/min — ABNORMAL LOW (ref >90)

## 2012-06-14 MED ORDER — FUROSEMIDE 40 MG PO TABS
40.0000 mg | ORAL_TABLET | Freq: Every day | ORAL | Status: DC
  Administered 2012-06-14: 40 mg via ORAL
  Filled 2012-06-14: qty 1

## 2012-06-14 MED ORDER — SODIUM CHLORIDE 0.9 % IV SOLN: 250.0000 mL | INTRAVENOUS | Status: DC | PRN

## 2012-06-14 MED ORDER — INFLUENZA VIRUS VACC SPLIT PF IM SUSP
0.5000 mL | INTRAMUSCULAR | Status: AC
  Administered 2012-06-15: 0.5 mL via INTRAMUSCULAR
  Filled 2012-06-14: qty 0.5

## 2012-06-14 MED ORDER — SODIUM CHLORIDE 0.9 % IJ SOLN: 3.0000 mL | INTRAMUSCULAR | Status: DC | PRN

## 2012-06-14 MED ORDER — ATORVASTATIN CALCIUM 40 MG PO TABS
40.0000 mg | ORAL_TABLET | Freq: Every day | ORAL | Status: DC
  Administered 2012-06-14 – 2012-06-15 (×2): 40 mg via ORAL
  Filled 2012-06-14 (×2): qty 1

## 2012-06-14 MED ORDER — SODIUM CHLORIDE 0.9 % IJ SOLN: 3.0000 mL | Freq: Two times a day (BID) | INTRAMUSCULAR | Status: DC

## 2012-06-14 MED ORDER — POTASSIUM CHLORIDE CRYS ER 20 MEQ PO TBCR
20.0000 meq | EXTENDED_RELEASE_TABLET | Freq: Two times a day (BID) | ORAL | Status: DC
  Administered 2012-06-14 – 2012-06-15 (×3): 20 meq via ORAL
  Filled 2012-06-14 (×4): qty 1

## 2012-06-14 MED ORDER — SODIUM CHLORIDE 0.9 % IJ SOLN
3.0000 mL | Freq: Two times a day (BID) | INTRAMUSCULAR | Status: DC
  Administered 2012-06-14 – 2012-06-15 (×4): 3 mL via INTRAVENOUS

## 2012-06-14 MED ORDER — FUROSEMIDE 40 MG PO TABS
40.0000 mg | ORAL_TABLET | Freq: Two times a day (BID) | ORAL | Status: DC
  Administered 2012-06-14 – 2012-06-15 (×2): 40 mg via ORAL
  Filled 2012-06-14 (×4): qty 1

## 2012-06-14 MED ORDER — HEPARIN SODIUM (PORCINE) 5000 UNIT/ML IJ SOLN
5000.0000 [IU] | Freq: Three times a day (TID) | INTRAMUSCULAR | Status: DC
  Administered 2012-06-14 – 2012-06-15 (×5): 5000 [IU] via SUBCUTANEOUS
  Filled 2012-06-14 (×8): qty 1

## 2012-06-14 MED ORDER — ASPIRIN 81 MG PO CHEW
81.0000 mg | CHEWABLE_TABLET | Freq: Every day | ORAL | Status: DC
Start: 2012-06-14 — End: 2012-06-15
  Administered 2012-06-14 – 2012-06-15 (×2): 81 mg via ORAL
  Filled 2012-06-14 (×2): qty 1

## 2012-06-14 MED ORDER — CARVEDILOL 6.25 MG PO TABS
6.2500 mg | ORAL_TABLET | Freq: Two times a day (BID) | ORAL | Status: DC
  Administered 2012-06-14 – 2012-06-15 (×3): 6.25 mg via ORAL
  Filled 2012-06-14 (×5): qty 1

## 2012-06-14 NOTE — Plan of Care (Signed)
Problem: Consults Goal: Tobacco Cessation referral if indicated Outcome: Not Applicable Date Met:  06/14/12 Pt. Stated he did not smoke
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
## 2012-06-15 DIAGNOSIS — I10 Essential (primary) hypertension: Secondary | ICD-10-CM

## 2012-06-15 LAB — BASIC METABOLIC PANEL
BUN: 21 mg/dL (ref 6–23)
Chloride: 100 mEq/L (ref 96–112)
Creatinine, Ser: 1.05 mg/dL (ref 0.50–1.35)
GFR calc Af Amer: 85 mL/min — ABNORMAL LOW (ref >90)

## 2012-06-15 MED ORDER — CARVEDILOL 6.25 MG PO TABS: 6.2500 mg | ORAL_TABLET | Freq: Two times a day (BID) | ORAL | Status: DC

## 2012-06-15 MED ORDER — FUROSEMIDE 40 MG PO TABS: 40.0000 mg | ORAL_TABLET | Freq: Every day | ORAL | Status: DC

## 2012-06-15 MED ORDER — LISINOPRIL 2.5 MG PO TABS
2.5000 mg | ORAL_TABLET | Freq: Every day | ORAL | Status: DC
  Filled 2012-06-15: qty 1

## 2012-06-15 MED ORDER — LISINOPRIL 2.5 MG PO TABS: 2.5000 mg | ORAL_TABLET | Freq: Every day | ORAL | Status: DC

## 2012-06-15 MED ORDER — POTASSIUM CHLORIDE CRYS ER 20 MEQ PO TBCR: 20.0000 meq | EXTENDED_RELEASE_TABLET | Freq: Two times a day (BID) | ORAL | Status: DC

## 2012-06-15 MED ORDER — ATORVASTATIN CALCIUM 40 MG PO TABS: 40.0000 mg | ORAL_TABLET | Freq: Every day | ORAL | Status: DC

## 2012-06-15 NOTE — Evaluation (Signed)
Physical Therapy Evaluation Patient Details Name: [name] MRN: [id number] DOB: 1945/11/03 Today's Date: 06/15/2012 Time: 4098-1191 PT Time Calculation (min): 10 min  PT Assessment / Plan / Recommendation Clinical Impression  Admitted for SOB in the setting of acute on chronic CHF exacerbation. Presents to PT today independent and at his baseline with regards to mobility. No further PT needs at this time.     PT Assessment  Patent does not need any further PT services    Follow Up Recommendations  No PT follow up    Does the patient have the potential to tolerate intense rehabilitation      Barriers to Discharge        Equipment Recommendations  None recommended by PT    Recommendations for Other Services     Frequency      Precautions / Restrictions Precautions Precautions: None Restrictions Weight Bearing Restrictions: No         Mobility  Bed Mobility Bed Mobility: Not assessed Details for Bed Mobility Assistance: pt sitting EOB on arrival, denies difficulty with bed mobility Transfers Transfers: Sit to Stand;Stand to Sit Sit to Stand: 7: Independent;From bed;Without upper extremity assist Stand to Sit: 7: Independent;To chair/3-in-1;Without upper extremity assist Ambulation/Gait Ambulation/Gait Assistance: 7: Independent Ambulation Distance (Feet): 450 Feet Assistive device: None Gait Pattern: Within Functional Limits Stairs: Yes Stairs Assistance: 7: Independent Stairs Assistance Details (indicate cue type and reason): ascended 3 steps with one rail modified independently, descended without rails and then perfored another set of 3 steps without rails independently Stair Management Technique: One rail Left;No rails Number of Stairs: 6       Visit Information  Last PT Received On: 06/15/12 Assistance Needed: +1    Subjective Data  Subjective: You gonna go walk my dogs for me? Patient Stated Goal: home   Prior Functioning  Home Living Lives  With: Alone Available Help at Discharge: Neighbor;Available PRN/intermittently Type of Home: House Home Access: Stairs to enter [employer] of Steps: 3 Entrance Stairs-Rails: None Home Layout: One level Home Adaptive Equipment: None Prior Function Level of Independence: Independent Able to Take Stairs?: Yes Driving: Yes Vocation: Retired Musician: Clinical cytogeneticist  Overall Cognitive Status: Appears within functional limits for tasks assessed/performed Arousal/Alertness: Awake/alert Orientation Level: Appears intact for tasks assessed Behavior During Session: [facility] for tasks performed    Extremity/Trunk Assessment Right Upper Extremity Assessment RUE ROM/Strength/Tone: Within functional levels Left Upper Extremity Assessment LUE ROM/Strength/Tone: Within functional levels Right Lower Extremity Assessment RLE ROM/Strength/Tone: Within functional levels Left Lower Extremity Assessment LLE ROM/Strength/Tone: Within functional levels Trunk Assessment Trunk Assessment: Normal   Balance Standardized Balance Assessment Standardized Balance Assessment: Dynamic Gait Index Dynamic Gait Index Level Surface: Normal Change in Gait Speed: Mild Impairment Gait with Horizontal Head Turns: Normal Gait with Vertical Head Turns: Normal Gait and Pivot Turn: Normal Step Over Obstacle: Normal Step Around Obstacles: Normal Steps: Normal Total Score: 23  High Level Balance High Level Balance Activites: Side stepping High Level Balance Comments: pt independently able to negotiate obstacles in his room by side stepping with narrow BOS without difficulty  End of Session PT - End of Session Equipment Utilized During Treatment: Gait belt Activity Tolerance: Patient tolerated treatment well Patient left: in chair;with call bell/phone within reach Nurse Communication: Mobility status  GP     [facility] HELEN 06/15/2012, 9:40 AM

## 2012-06-15 NOTE — Progress Notes (Signed)
Discussed discharge instructions with pt including how and when to call the Dr, follow up appt, medications to take at home, activity, and diet. Pt verbalized understanding and denied any questions. Prescriptions given and pt acknowledged receipt.  Juliane Lack, RN
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
## 2012-06-15 NOTE — Care Management Note (Signed)
    Page 1 of 1   06/15/2012     11:47:27 AM   CARE MANAGEMENT NOTE 06/15/2012  Patient:  [name], [name]   Account Number:  [id number]  Date Initiated:  06/15/2012  Documentation initiated by:  Tera Mater  Subjective/Objective Assessment:   67yo male admitted with CHF.  Pt. lives at home with girlfriend.     Action/Plan:   In to speak with pt. about HH needs.  Pt. states that he has had [facility] in past and was satisfied with their care.  Pt. states he has a walker at home.   Anticipated DC Date:  06/15/2012   Anticipated DC Plan:  HOME W HOME HEALTH SERVICES      DC Planning Services  CM consult      [facility] Choice  HOME HEALTH   Choice offered to / List presented to:  C-1 Patient        HH arranged  HH-1 RN  HH-10 DISEASE MANAGEMENT      HH agency  [facility]   Status of service:  In process, will continue to follow Medicare Important Message given?   (If response is "NO", the following Medicare IM given date fields will be blank) Date Medicare IM given:   Date Additional Medicare IM given:    Discharge Disposition:    Per UR Regulation:  Reviewed for med. necessity/level of care/duration of stay  If discussed at Long Length of Stay Meetings, dates discussed:    Comments:  06/15/12 1035 TC to [name], with [facility] to give referral for [facility] RN for HF management.  Pt. may be dc home today. Tera Mater, RN, BSN NCM [phone]

## 2012-06-16 ENCOUNTER — Encounter: Payer: Self-pay | Admitting: Internal Medicine

## 2012-06-21 NOTE — Progress Notes (Signed)
Utilization Review Completed.   Kalyn Hofstra, RN, BSN Nurse Case Manager  [phone]  

## 2012-09-08 ENCOUNTER — Telehealth: Payer: Self-pay | Admitting: Internal Medicine

## 2012-09-08 NOTE — Telephone Encounter (Signed)
09-08-12 lmm @ 1219 for pt's dtr chrystal to call to set up past due pacer ck, ok with brooke/mt

## 2012-09-09 ENCOUNTER — Encounter (HOSPITAL_COMMUNITY): Payer: Self-pay | Admitting: *Deleted

## 2012-09-09 ENCOUNTER — Emergency Department (HOSPITAL_COMMUNITY): Payer: Medicare PPO

## 2012-09-09 ENCOUNTER — Inpatient Hospital Stay (HOSPITAL_COMMUNITY)
Admission: EM | Admit: 2012-09-09 | Discharge: 2012-09-12 | DRG: 292 | Disposition: A | Discharge status: Home Health | Payer: Medicare PPO | Attending: Internal Medicine | Admitting: Internal Medicine

## 2012-09-09 DIAGNOSIS — I472 Ventricular tachycardia, unspecified: Secondary | ICD-10-CM

## 2012-09-09 DIAGNOSIS — J449 Chronic obstructive pulmonary disease, unspecified: Secondary | ICD-10-CM

## 2012-09-09 DIAGNOSIS — F411 Generalized anxiety disorder: Secondary | ICD-10-CM | POA: Diagnosis present

## 2012-09-09 DIAGNOSIS — I5023 Acute on chronic systolic (congestive) heart failure: Principal | ICD-10-CM

## 2012-09-09 DIAGNOSIS — E785 Hyperlipidemia, unspecified: Secondary | ICD-10-CM

## 2012-09-09 DIAGNOSIS — Z950 Presence of cardiac pacemaker: Secondary | ICD-10-CM

## 2012-09-09 DIAGNOSIS — F319 Bipolar disorder, unspecified: Secondary | ICD-10-CM | POA: Diagnosis present

## 2012-09-09 DIAGNOSIS — Z91199 Patient's noncompliance with other medical treatment and regimen due to unspecified reason: Secondary | ICD-10-CM

## 2012-09-09 DIAGNOSIS — Z95810 Presence of automatic (implantable) cardiac defibrillator: Secondary | ICD-10-CM

## 2012-09-09 DIAGNOSIS — R0602 Shortness of breath: Secondary | ICD-10-CM

## 2012-09-09 DIAGNOSIS — J4489 Other specified chronic obstructive pulmonary disease: Secondary | ICD-10-CM | POA: Diagnosis present

## 2012-09-09 DIAGNOSIS — Z7901 Long term (current) use of anticoagulants: Secondary | ICD-10-CM

## 2012-09-09 DIAGNOSIS — I4891 Unspecified atrial fibrillation: Secondary | ICD-10-CM

## 2012-09-09 DIAGNOSIS — R0989 Other specified symptoms and signs involving the circulatory and respiratory systems: Secondary | ICD-10-CM

## 2012-09-09 DIAGNOSIS — Z8673 Personal history of transient ischemic attack (TIA), and cerebral infarction without residual deficits: Secondary | ICD-10-CM

## 2012-09-09 DIAGNOSIS — I482 Chronic atrial fibrillation, unspecified: Secondary | ICD-10-CM | POA: Diagnosis present

## 2012-09-09 DIAGNOSIS — R319 Hematuria, unspecified: Secondary | ICD-10-CM | POA: Diagnosis present

## 2012-09-09 DIAGNOSIS — I5022 Chronic systolic (congestive) heart failure: Secondary | ICD-10-CM

## 2012-09-09 DIAGNOSIS — I513 Intracardiac thrombosis, not elsewhere classified: Secondary | ICD-10-CM

## 2012-09-09 DIAGNOSIS — I10 Essential (primary) hypertension: Secondary | ICD-10-CM

## 2012-09-09 DIAGNOSIS — Z7982 Long term (current) use of aspirin: Secondary | ICD-10-CM

## 2012-09-09 DIAGNOSIS — F1010 Alcohol abuse, uncomplicated: Secondary | ICD-10-CM | POA: Diagnosis present

## 2012-09-09 DIAGNOSIS — Z8249 Family history of ischemic heart disease and other diseases of the circulatory system: Secondary | ICD-10-CM

## 2012-09-09 DIAGNOSIS — I442 Atrioventricular block, complete: Secondary | ICD-10-CM

## 2012-09-09 DIAGNOSIS — Z79899 Other long term (current) drug therapy: Secondary | ICD-10-CM

## 2012-09-09 DIAGNOSIS — J982 Interstitial emphysema: Secondary | ICD-10-CM

## 2012-09-09 DIAGNOSIS — Z9119 Patient's noncompliance with other medical treatment and regimen: Secondary | ICD-10-CM

## 2012-09-09 DIAGNOSIS — S2232XD Fracture of one rib, left side, subsequent encounter for fracture with routine healing: Secondary | ICD-10-CM

## 2012-09-09 DIAGNOSIS — S2239XA Fracture of one rib, unspecified side, initial encounter for closed fracture: Secondary | ICD-10-CM | POA: Diagnosis present

## 2012-09-09 DIAGNOSIS — Z9181 History of falling: Secondary | ICD-10-CM

## 2012-09-09 DIAGNOSIS — I2510 Atherosclerotic heart disease of native coronary artery without angina pectoris: Secondary | ICD-10-CM

## 2012-09-09 DIAGNOSIS — I509 Heart failure, unspecified: Secondary | ICD-10-CM | POA: Diagnosis present

## 2012-09-09 DIAGNOSIS — Z951 Presence of aortocoronary bypass graft: Secondary | ICD-10-CM

## 2012-09-09 DIAGNOSIS — I426 Alcoholic cardiomyopathy: Secondary | ICD-10-CM | POA: Diagnosis present

## 2012-09-09 DIAGNOSIS — W19XXXA Unspecified fall, initial encounter: Secondary | ICD-10-CM | POA: Diagnosis present

## 2012-09-09 DIAGNOSIS — Z87891 Personal history of nicotine dependence: Secondary | ICD-10-CM

## 2012-09-09 DIAGNOSIS — Z954 Presence of other heart-valve replacement: Secondary | ICD-10-CM

## 2012-09-09 DIAGNOSIS — I428 Other cardiomyopathies: Secondary | ICD-10-CM | POA: Diagnosis present

## 2012-09-09 LAB — PRO B NATRIURETIC PEPTIDE: Pro B Natriuretic peptide (BNP): 2279 pg/mL — ABNORMAL HIGH (ref 0–125)

## 2012-09-09 LAB — CBC
HCT: 41.5 % (ref 39.0–52.0)
Hemoglobin: 14 g/dL (ref 13.0–17.0)
MCV: 88.8 fL (ref 78.0–100.0)
Platelets: 189 10*3/uL (ref 150–400)
RBC: 4.46 MIL/uL (ref 4.22–5.81)
RBC: 4.68 MIL/uL (ref 4.22–5.81)
RDW: 15.3 % (ref 11.5–15.5)
WBC: 9.7 10*3/uL (ref 4.0–10.5)
WBC: 11.6 10*3/uL — ABNORMAL HIGH (ref 4.0–10.5)

## 2012-09-09 LAB — BASIC METABOLIC PANEL
CO2: 23 mEq/L (ref 19–32)
Calcium: 9 mg/dL (ref 8.4–10.5)
Chloride: 108 mEq/L (ref 96–112)
Creatinine, Ser: 1.04 mg/dL (ref 0.50–1.35)
GFR calc Af Amer: 85 mL/min — ABNORMAL LOW (ref >90)
Sodium: 142 mEq/L (ref 135–145)

## 2012-09-09 LAB — TROPONIN I: Troponin I: <0.3 ng/mL (ref <0.30)

## 2012-09-09 LAB — POCT I-STAT TROPONIN I: Troponin i, poc: 0.05 ng/mL (ref 0.00–0.08)

## 2012-09-09 LAB — CREATININE, SERUM
Creatinine, Ser: 1.13 mg/dL (ref 0.50–1.35)
GFR calc Af Amer: 77 mL/min — ABNORMAL LOW (ref >90)
GFR calc non Af Amer: 66 mL/min — ABNORMAL LOW (ref >90)

## 2012-09-09 MED ORDER — ALBUTEROL SULFATE (5 MG/ML) 0.5% IN NEBU: 2.5000 mg | INHALATION_SOLUTION | Freq: Four times a day (QID) | RESPIRATORY_TRACT | Status: DC

## 2012-09-09 MED ORDER — SODIUM CHLORIDE 0.9 % IV SOLN: 250.0000 mL | INTRAVENOUS | Status: DC | PRN

## 2012-09-09 MED ORDER — MORPHINE SULFATE 2 MG/ML IJ SOLN: 2.0000 mg | INTRAMUSCULAR | Status: DC | PRN

## 2012-09-09 MED ORDER — FUROSEMIDE 10 MG/ML IJ SOLN
40.0000 mg | Freq: Once | INTRAMUSCULAR | Status: AC
  Administered 2012-09-09: 40 mg via INTRAVENOUS
  Filled 2012-09-09: qty 4

## 2012-09-09 MED ORDER — SODIUM CHLORIDE 0.9 % IJ SOLN: 3.0000 mL | INTRAMUSCULAR | Status: DC | PRN

## 2012-09-09 MED ORDER — LISINOPRIL 2.5 MG PO TABS
2.5000 mg | ORAL_TABLET | Freq: Every day | ORAL | Status: DC
  Administered 2012-09-10: 2.5 mg via ORAL
  Filled 2012-09-09: qty 1

## 2012-09-09 MED ORDER — POTASSIUM CHLORIDE CRYS ER 20 MEQ PO TBCR
20.0000 meq | EXTENDED_RELEASE_TABLET | Freq: Two times a day (BID) | ORAL | Status: DC
  Administered 2012-09-09 – 2012-09-12 (×6): 20 meq via ORAL
  Filled 2012-09-09 (×8): qty 1

## 2012-09-09 MED ORDER — ASPIRIN 81 MG PO CHEW
81.0000 mg | CHEWABLE_TABLET | Freq: Every day | ORAL | Status: DC
  Administered 2012-09-10 – 2012-09-12 (×3): 81 mg via ORAL
  Filled 2012-09-09 (×4): qty 1

## 2012-09-09 MED ORDER — ENOXAPARIN SODIUM 40 MG/0.4ML ~~LOC~~ SOLN
40.0000 mg | SUBCUTANEOUS | Status: DC
  Administered 2012-09-09 – 2012-09-11 (×3): 40 mg via SUBCUTANEOUS
  Filled 2012-09-09 (×4): qty 0.4

## 2012-09-09 MED ORDER — SODIUM CHLORIDE 0.9 % IJ SOLN
3.0000 mL | Freq: Two times a day (BID) | INTRAMUSCULAR | Status: DC
  Administered 2012-09-11: 3 mL via INTRAVENOUS

## 2012-09-09 MED ORDER — ALBUTEROL SULFATE (5 MG/ML) 0.5% IN NEBU: 2.5000 mg | INHALATION_SOLUTION | RESPIRATORY_TRACT | Status: DC | PRN

## 2012-09-09 MED ORDER — FUROSEMIDE 10 MG/ML IJ SOLN
40.0000 mg | Freq: Two times a day (BID) | INTRAMUSCULAR | Status: DC
  Administered 2012-09-10: 40 mg via INTRAVENOUS
  Filled 2012-09-09 (×3): qty 4

## 2012-09-09 MED ORDER — CARVEDILOL 6.25 MG PO TABS
6.2500 mg | ORAL_TABLET | Freq: Two times a day (BID) | ORAL | Status: DC
  Administered 2012-09-10 – 2012-09-12 (×5): 6.25 mg via ORAL
  Filled 2012-09-09 (×7): qty 1

## 2012-09-09 MED ORDER — ATORVASTATIN CALCIUM 40 MG PO TABS
40.0000 mg | ORAL_TABLET | Freq: Every day | ORAL | Status: DC
  Administered 2012-09-10 – 2012-09-12 (×3): 40 mg via ORAL
  Filled 2012-09-09 (×3): qty 1

## 2012-09-09 MED ORDER — SODIUM CHLORIDE 0.9 % IJ SOLN
3.0000 mL | Freq: Two times a day (BID) | INTRAMUSCULAR | Status: DC
  Administered 2012-09-09 – 2012-09-12 (×6): 3 mL via INTRAVENOUS

## 2012-09-09 MED ORDER — ALBUTEROL SULFATE (5 MG/ML) 0.5% IN NEBU
2.5000 mg | INHALATION_SOLUTION | Freq: Four times a day (QID) | RESPIRATORY_TRACT | Status: DC
  Administered 2012-09-10: 2.5 mg via RESPIRATORY_TRACT
  Filled 2012-09-09: qty 0.5

## 2012-09-09 NOTE — ED Notes (Signed)
Patient transported to X-ray 

## 2012-09-09 NOTE — ED Provider Notes (Signed)
History     CSN: [id number]  Arrival date & time 09/09/12  1711   First MD Initiated Contact with Patient 09/09/12 1743      Chief Complaint  Patient presents with  . Shortness of Breath  . Chest Pain   HPI  Patient presents with dyspnea.  The patient states that the symptoms became pronounced approximately 2 days ago.  It seems as though the symptoms have been present for several weeks, this is according to the patient's daughter. Over the past 2 days the dyspnea has become pronounced both at rest, and with exertion.  There is no new associated chest pain.  The patient does have some a recent fall with subsequent rib fracture.  He notes that he continues to have pain in his left lower rib cage.  This is unchanged, and not pleuritic or exertional. Over the past 2 days of been no clear alleviating or exacerbating factors. The patient has a notable history of congestive heart failure, atrial fibrillation, has a Facilities manager in place. The patient also has a mitral valve replacement, animal valve, patient unsure of bovine or porcine.  History of present illness per the patient, his daughter, another family member. Past Medical History  Diagnosis Date  . DVT (deep venous thrombosis)   . 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 COPD (chronic obstructive pulmonary disease)   . Depression   . Hypertension   . Dyslipidemia   . Ventral hernia   . CAD (coronary artery disease)   . Hernia   . Chronic systolic congestive heart failure   . Bipolar disorder   . Anxiety   . Atrial fibrillation     persistent  . Alcohol abuse   . Complete heart block     s/p prior AV nodal ablation  . Stroke   . CVA (cerebral vascular accident)     multiple prior embolic strokes    Past Surgical History  Procedure Laterality Date  . Cystoscopy w/ ureteral stent placement  07/12/2011    Procedure: CYSTOSCOPY WITH RETROGRADE  PYELOGRAM/URETERAL STENT PLACEMENT;  Surgeon: Sebastian Ache, MD;  Location: [facility] OR;  Service: Urology;  Laterality: Left;  . Coronary artery bypass graft    . Hernia repair    . Mitral valve replacement      remote [facility] valve 9562 with redo tissue valve 06/2006  . Pacemaker placement    . Av node ablation      Family History  Problem Relation Age of Onset  . Stroke    . Heart disease    . Alzheimer's disease Mother   . Heart attack Father     History  Substance Use Topics  . Smoking status: Former Smoker -- 2.00 packs/day    Types: Cigarettes    Quit date: 01/30/2003  . Smokeless tobacco: Never Used  . Alcohol Use: Yes     Comment: occasionally      Review of Systems  Constitutional:       Per HPI, otherwise negative  HENT:       Per HPI, otherwise negative  Respiratory:       Per HPI, otherwise negative  Cardiovascular:       Per HPI, otherwise negative  Gastrointestinal: Negative for vomiting.  Endocrine:       Negative aside from HPI  Genitourinary:       Neg aside from HPI   Musculoskeletal:  Per HPI, otherwise negative  Skin: Negative.   Neurological: Negative for syncope.    Allergies  Review of patient's allergies indicates no known allergies.  Home Medications   Current Outpatient Rx  Name  Route  Sig  Dispense  Refill  . aspirin 81 MG tablet   Oral   Take 81 mg by mouth daily.           Marland Kitchen atorvastatin (LIPITOR) 40 MG tablet   Oral   Take 1 tablet (40 mg total) by mouth daily.   30 tablet   0   . carvedilol (COREG) 6.25 MG tablet   Oral   Take 1 tablet (6.25 mg total) by mouth 2 (two) times daily with a meal.   60 tablet   0   . furosemide (LASIX) 40 MG tablet   Oral   Take 1 tablet (40 mg total) by mouth daily.   30 tablet   0   . lisinopril (PRINIVIL,ZESTRIL) 2.5 MG tablet   Oral   Take 1 tablet (2.5 mg total) by mouth daily.   30 tablet   0   . potassium chloride SA (K-DUR,KLOR-CON) 20 MEQ tablet   Oral    Take 1 tablet (20 mEq total) by mouth 2 (two) times daily.   60 tablet   0     BP 166/72  Pulse 62  Temp(Src) 98.1 F (36.7 C) (Oral)  Resp 14  SpO2 98%  Physical Exam  Nursing note and vitals reviewed. Constitutional: He is oriented to person, place, and time. He appears well-developed. No distress.  HENT:  Head: Normocephalic and atraumatic.  Eyes: Conjunctivae and EOM are normal.  Cardiovascular: Normal rate and regular rhythm.   Murmur heard. Pulmonary/Chest: No stridor. Tachypnea noted. He has decreased breath sounds.  Abdominal: He exhibits no distension.  Musculoskeletal: He exhibits no edema.  Neurological: He is alert and oriented to person, place, and time.  Skin: Skin is warm and dry.  Psychiatric: He has a normal mood and affect.    ED Course  Procedures (including critical care time)  Labs Reviewed  CBC  BASIC METABOLIC PANEL  PRO B NATRIURETIC PEPTIDE   Dg Chest 2 View  09/09/2012  *RADIOLOGY REPORT*  Clinical Data: Shortness of breath and chest pain.  CHEST - 2 VIEW  Comparison: 06/13/2012  Findings: Stable appearance of the pacing / ICD device and mitral valve prosthesis.  Lungs show a component of chronic disease and potentially mild overlying interstitial edema/CHF.  No significant pleural fluid is identified.  IMPRESSION: Potentially mild interstitial edema superimposed on chronic lung disease.   Original Report Authenticated By: Irish Lack, M.D.      No diagnosis found.  Pulse ox 96% room air normal  Cardiac: 60 paced, abnormal   Date: 09/09/2012  Rate: 60  Rhythm: paced  QRS Axis: left  Intervals: paced  ST/T Wave abnormalities: nonspecific ST/T changes  Conduction Disutrbances:paced  Narrative Interpretation:   Old EKG Reviewed: none available ABNORMAL  Initial labs notable for elevated BNP. Lasix provided   MDM  This patient presents with increasing dyspnea.  On exam the patient is not hypoxic, but he appears uncomfortable and  is tachypneic.  The patient has minimal chest pain, there is low suspicion for ACS kidney nonischemic ECG, the absence of distress. The patient's BNP is elevated.  Patient was started on Lasix, admitted for further evaluation and management.        Gerhard Munch, MD 09/09/12 [phone]
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
## 2012-09-09 NOTE — ED Notes (Signed)
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  SOB increases with ambulation.  No wheezing noted. +1 edema noted to LE bil.

## 2012-09-10 DIAGNOSIS — I509 Heart failure, unspecified: Secondary | ICD-10-CM

## 2012-09-10 DIAGNOSIS — IMO0001 Reserved for inherently not codable concepts without codable children: Secondary | ICD-10-CM

## 2012-09-10 DIAGNOSIS — I4891 Unspecified atrial fibrillation: Secondary | ICD-10-CM

## 2012-09-10 DIAGNOSIS — J449 Chronic obstructive pulmonary disease, unspecified: Secondary | ICD-10-CM

## 2012-09-10 DIAGNOSIS — I2510 Atherosclerotic heart disease of native coronary artery without angina pectoris: Secondary | ICD-10-CM

## 2012-09-10 DIAGNOSIS — I517 Cardiomegaly: Secondary | ICD-10-CM

## 2012-09-10 DIAGNOSIS — I5023 Acute on chronic systolic (congestive) heart failure: Principal | ICD-10-CM

## 2012-09-10 DIAGNOSIS — I10 Essential (primary) hypertension: Secondary | ICD-10-CM

## 2012-09-10 LAB — TROPONIN I: Troponin I: <0.3 ng/mL (ref <0.30)

## 2012-09-10 MED ORDER — FUROSEMIDE 10 MG/ML IJ SOLN
40.0000 mg | Freq: Three times a day (TID) | INTRAMUSCULAR | Status: DC
  Administered 2012-09-10 – 2012-09-12 (×6): 40 mg via INTRAVENOUS
  Filled 2012-09-10 (×8): qty 4

## 2012-09-10 MED ORDER — ALBUTEROL SULFATE (5 MG/ML) 0.5% IN NEBU
2.5000 mg | INHALATION_SOLUTION | Freq: Three times a day (TID) | RESPIRATORY_TRACT | Status: DC
  Administered 2012-09-10 – 2012-09-12 (×6): 2.5 mg via RESPIRATORY_TRACT
  Filled 2012-09-10 (×7): qty 0.5

## 2012-09-10 MED ORDER — LISINOPRIL 5 MG PO TABS
5.0000 mg | ORAL_TABLET | Freq: Two times a day (BID) | ORAL | Status: DC
  Administered 2012-09-10 – 2012-09-12 (×4): 5 mg via ORAL
  Filled 2012-09-10 (×8): qty 1

## 2012-09-10 NOTE — Progress Notes (Signed)
*  PRELIMINARY RESULTS* Echocardiogram 2D Echocardiogram has been performed.  [name] 09/10/2012, 4:22 PM

## 2012-09-10 NOTE — Progress Notes (Signed)
[name] PROGRESS NOTE  [name] OZH:[id number] DOB: 1945-12-20 DOA: 09/09/2012 PCP: Donato Schultz, MD  Assessment/Plan: Acute on chronic systolic CHF (congestive heart failure)/s/p ICD-Medtronic -continue diuresing with iv lasix - cardiac enzymes neg, await echo -continue coreg, lisinopril, lipitor and ASA -I have consulted cardiology for further recs . Hypertension -continue coreg and lisinopril . Hyperlipidemia . Severe chronic obstructive pulmonary disease -stable, continue bronchodilators . Complete heart block-s/p PPM . CAD (coronary artery disease) . Atrial fibrillation-s/p av nodal ablation  Code Status: FULL Family Communication: directly with pt at bedside Disposition Plan: to home when stable   Consultants:  Cardiology   Procedures:  NONE  Antibiotics:  none  HPI/Subjective: States breathing better, denies CP.  Objective: Filed Vitals:   09/09/12 2204 09/10/12 0243 09/10/12 0516 09/10/12 0915  BP: 153/78  151/91   Pulse: 66  56   Temp: 97.7 F (36.5 C)  97.8 F (36.6 C)   TempSrc: Oral  Oral   Resp: 20  20   Height: 5\' 6"  (1.676 m)     Weight: 66.996 kg (147 lb 11.2 oz)  66.1 kg (145 lb 11.6 oz)   SpO2: 97% 97% 98% 98%    Intake/Output Summary (Last 24 hours) at 09/10/12 1125 Last data filed at 09/10/12 0900  Gross per 24 hour  Intake    480 ml  Output   2375 ml  Net  -1895 ml   Filed Weights   09/09/12 2204 09/10/12 0516  Weight: 66.996 kg (147 lb 11.2 oz) 66.1 kg (145 lb 11.6 oz)    Exam:   General: alert and oriented x3, in NAD  Cardiovascular: RRR,nl S1S2  Respiratory: CTAB,no wheezes  Abdomen: Soft,+BS NT/ND  Extremities-trace to+1edema  Data Reviewed: Basic Metabolic Panel:  Recent Labs Lab 09/09/12 1731 09/09/12 2216  NA 142  --   K 4.8  --   CL 108  --   CO2 23  --   GLUCOSE 104*  --   BUN 11  --   CREATININE 1.04 1.13  CALCIUM 9.0  --    Liver Function Tests: No results found for this  basename: AST, ALT, ALKPHOS, BILITOT, PROT, ALBUMIN,  in the last 168 hours No results found for this basename: LIPASE, AMYLASE,  in the last 168 hours No results found for this basename: AMMONIA,  in the last 168 hours CBC:  Recent Labs Lab 09/09/12 1731 09/09/12 2216  WBC 9.7 11.6*  HGB 13.2 14.0  HCT 39.6 41.5  MCV 88.8 88.7  PLT 189 196   Cardiac Enzymes:  Recent Labs Lab 09/09/12 2216 09/10/12 0500 09/10/12 0955  TROPONINI <0.30 <0.30 <0.30   BNP (last 3 results)  Recent Labs  06/13/12 1942 09/09/12 1731  PROBNP 3949.0* 2279.0*   CBG: No results found for this basename: GLUCAP,  in the last 168 hours  No results found for this or any previous visit (from the past 240 hour(s)).   Studies: Dg Chest 2 View  09/09/2012  *RADIOLOGY REPORT*  Clinical Data: Shortness of breath and chest pain.  CHEST - 2 VIEW  Comparison: 06/13/2012  Findings: Stable appearance of the pacing / ICD device and mitral valve prosthesis.  Lungs show a component of chronic disease and potentially mild overlying interstitial edema/CHF.  No significant pleural fluid is identified.  IMPRESSION: Potentially mild interstitial edema superimposed on chronic lung disease.   Original Report Authenticated By: Irish Lack, M.D.     Scheduled Meds: . albuterol  2.5 mg Nebulization  TID  . aspirin  81 mg Oral Daily  . atorvastatin  40 mg Oral Daily  . carvedilol  6.25 mg Oral BID WC  . enoxaparin (LOVENOX) injection  40 mg Subcutaneous Q24H  . furosemide  40 mg Intravenous BID  . lisinopril  2.5 mg Oral Daily  . potassium chloride SA  20 mEq Oral BID  . sodium chloride  3 mL Intravenous Q12H  . sodium chloride  3 mL Intravenous Q12H   Continuous Infusions:   Principal Problem:   Acute on chronic systolic CHF (congestive heart failure) Active Problems:   CAD (coronary artery disease)   Rib fractures   Anticoagulated on warfarin   Atrial fibrillation   Complete heart block   Severe chronic  obstructive pulmonary disease   Hyperlipidemia   ICD-Medtronic   Hypertension    Time spent: >52mins    [facility]  [name] Pager [phone]. If 7PM-7AM, please contact night-coverage at [email or web address], password [facility] 09/10/2012, 11:25 AM  LOS: 1 day

## 2012-09-10 NOTE — Care Management Note (Unsigned)
    Page 1 of 1   09/10/2012     1:52:14 PM   CARE MANAGEMENT NOTE 09/10/2012  Patient:  [name], [name]   Account Number:  [id number]  Date Initiated:  09/10/2012  Documentation initiated by:  GRAVES-BIGELOW,Francenia Chimenti  Subjective/Objective Assessment:   Pt admitted with CHF. Pt is from home.     Action/Plan:   CM will continue to monitor for disposition needs at this time. Pt may benefit from [facility] at d/c.   Anticipated DC Date:  09/12/2012   Anticipated DC Plan:  HOME W HOME HEALTH SERVICES      DC Planning Services  CM consult      Choice offered to / List presented to:             Status of service:  In process, will continue to follow Medicare Important Message given?   (If response is "NO", the following Medicare IM given date fields will be blank) Date Medicare IM given:   Date Additional Medicare IM given:    Discharge Disposition:    Per UR Regulation:    If discussed at Long Length of Stay Meetings, dates discussed:    Comments:
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
## 2012-09-11 DIAGNOSIS — I442 Atrioventricular block, complete: Secondary | ICD-10-CM

## 2012-09-11 LAB — BASIC METABOLIC PANEL
BUN: 24 mg/dL — ABNORMAL HIGH (ref 6–23)
Creatinine, Ser: 1.13 mg/dL (ref 0.50–1.35)
GFR calc Af Amer: 77 mL/min — ABNORMAL LOW (ref >90)
GFR calc non Af Amer: 66 mL/min — ABNORMAL LOW (ref >90)
Glucose, Bld: 106 mg/dL — ABNORMAL HIGH (ref 70–99)
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
## 2012-09-11 NOTE — Progress Notes (Signed)
[name] PROGRESS NOTE  [name]:[id number] DOB: [birth year] DOA: 09/09/2012 PCP: Donato Schultz, MD  Assessment/Plan: Acute on chronic systolic CHF (congestive heart failure)/s/p ICD-Medtronic -continue diuresing with iv lasix - cardiac enzymes neg, await echo -continue coreg, lisinopril increased to 5mg , lipitor and ASA -appreciate cardiology input -continuing iv lasix for now -medtronic to interrogate device in am prior to d/c . Hypertension -continue coreg and lisinopril . Hyperlipidemia . Severe chronic obstructive pulmonary disease -stable, continue bronchodilators . Complete heart block-s/p PPM . CAD (coronary artery disease) . Atrial fibrillation-s/p av nodal ablation  Code Status: FULL Family Communication: directly with pt at bedside Disposition Plan: to home possibly in am   Consultants:  Cardiology   Procedures:  NONE  Antibiotics:  none  HPI/Subjective: States feels much better, denies CP.  Objective: Filed Vitals:   09/10/12 2042 09/11/12 0156 09/11/12 0628 09/11/12 0939  BP: 131/68 128/72 129/66   Pulse: 61 62 59   Temp: 97.8 F (36.6 C) 98 F (36.7 C) 97.9 F (36.6 C)   TempSrc: Oral Oral Oral   Resp: 20 20 20    Height:      Weight:   65.4 kg (144 lb 2.9 oz)   SpO2: 98% 97% 97% 92%    Intake/Output Summary (Last 24 hours) at 09/11/12 0954 Last data filed at 09/11/12 0931  Gross per 24 hour  Intake    920 ml  Output   2600 ml  Net  -1680 ml   Filed Weights   09/09/12 2204 09/10/12 0516 09/11/12 0628  Weight: 66.996 kg (147 lb 11.2 oz) 66.1 kg (145 lb 11.6 oz) 65.4 kg (144 lb 2.9 oz)    Exam:   General: alert and oriented x3, in NAD  Cardiovascular: RRR,nl S1S2  Respiratory: CTAB,no wheezes  Abdomen: Soft,+BS NT/ND  Extremities-no edema, no cyanosis  Data Reviewed: Basic Metabolic Panel:  Recent Labs Lab 09/09/12 1731 09/09/12 2216 09/11/12 0450  NA 142  --  137  K 4.8  --  4.5  CL 108  --  99   CO2 23  --  29  GLUCOSE 104*  --  106*  BUN 11  --  24*  CREATININE 1.04 1.13 1.13  CALCIUM 9.0  --  9.9   Liver Function Tests: No results found for this basename: AST, ALT, ALKPHOS, BILITOT, PROT, ALBUMIN,  in the last 168 hours No results found for this basename: LIPASE, AMYLASE,  in the last 168 hours No results found for this basename: AMMONIA,  in the last 168 hours CBC:  Recent Labs Lab 09/09/12 1731 09/09/12 2216  WBC 9.7 11.6*  HGB 13.2 14.0  HCT 39.6 41.5  MCV 88.8 88.7  PLT 189 196   Cardiac Enzymes:  Recent Labs Lab 09/09/12 2216 09/10/12 0500 09/10/12 0955  TROPONINI <0.30 <0.30 <0.30   BNP (last 3 results)  Recent Labs  06/13/12 1942 09/09/12 1731  PROBNP 3949.0* 2279.0*   CBG: No results found for this basename: GLUCAP,  in the last 168 hours  No results found for this or any previous visit (from the past 240 hour(s)).   Studies: Dg Chest 2 View  09/09/2012  *RADIOLOGY REPORT*  Clinical Data: Shortness of breath and chest pain.  CHEST - 2 VIEW  Comparison: 06/13/2012  Findings: Stable appearance of the pacing / ICD device and mitral valve prosthesis.  Lungs show a component of chronic disease and potentially mild overlying interstitial edema/CHF.  No significant pleural fluid is identified.  IMPRESSION:  Potentially mild interstitial edema superimposed on chronic lung disease.   Original Report Authenticated By: Irish Lack, M.D.     Scheduled Meds: . albuterol  2.5 mg Nebulization TID  . aspirin  81 mg Oral Daily  . atorvastatin  40 mg Oral Daily  . carvedilol  6.25 mg Oral BID WC  . enoxaparin (LOVENOX) injection  40 mg Subcutaneous Q24H  . furosemide  40 mg Intravenous Q8H  . lisinopril  5 mg Oral BID  . potassium chloride SA  20 mEq Oral BID  . sodium chloride  3 mL Intravenous Q12H  . sodium chloride  3 mL Intravenous Q12H   Continuous Infusions:   Principal Problem:   Acute on chronic systolic CHF (congestive heart  failure) Active Problems:   CAD (coronary artery disease)   Rib fractures   Anticoagulated on warfarin   Atrial fibrillation   Complete heart block   Severe chronic obstructive pulmonary disease   Hyperlipidemia   ICD-Medtronic   Hypertension    Time spent:    Kela Millin  [name] Pager [phone]. If 7PM-7AM, please contact night-coverage at [email or web address], password [facility] 09/11/2012, 9:54 AM  LOS: 2 days

## 2012-09-12 DIAGNOSIS — Z95810 Presence of automatic (implantable) cardiac defibrillator: Secondary | ICD-10-CM

## 2012-09-12 MED ORDER — LISINOPRIL 5 MG PO TABS: 5.0000 mg | ORAL_TABLET | Freq: Two times a day (BID) | ORAL | Status: DC

## 2012-09-12 MED ORDER — ALBUTEROL SULFATE HFA 108 (90 BASE) MCG/ACT IN AERS: 2.0000 | INHALATION_SPRAY | Freq: Four times a day (QID) | RESPIRATORY_TRACT | Status: DC | PRN

## 2012-09-12 MED ORDER — BUDESONIDE-FORMOTEROL FUMARATE 160-4.5 MCG/ACT IN AERO: 2.0000 | INHALATION_SPRAY | Freq: Two times a day (BID) | RESPIRATORY_TRACT | Status: DC

## 2012-09-12 MED ORDER — FUROSEMIDE 40 MG PO TABS: 40.0000 mg | ORAL_TABLET | Freq: Two times a day (BID) | ORAL | Status: DC

## 2012-09-12 NOTE — Progress Notes (Signed)
Spoke with [place] , [place]  [facility].  Instructed ok for pt to d/c seen already this am.  All d/c instructions explained and given to pt.  Verbalized understanding.  Acheron Sugg,RN.
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
## 2012-09-12 NOTE — Progress Notes (Signed)
Patient has been discharged and has left the unit going home.  Lorretta Harp RN
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
## 2012-09-28 ENCOUNTER — Encounter: Payer: Medicare PPO | Admitting: Nurse Practitioner

## 2012-10-04 ENCOUNTER — Encounter: Payer: Self-pay | Admitting: Internal Medicine

## 2012-10-05 ENCOUNTER — Encounter: Payer: Self-pay | Admitting: Nurse Practitioner

## 2012-11-03 ENCOUNTER — Ambulatory Visit (INDEPENDENT_AMBULATORY_CARE_PROVIDER_SITE_OTHER): Payer: Medicare PPO | Admitting: Internal Medicine

## 2012-11-03 ENCOUNTER — Encounter: Payer: Self-pay | Admitting: Internal Medicine

## 2012-11-03 VITALS — BP 162/94 | HR 68 | Ht 66.0 in | Wt 154.0 lb

## 2012-11-03 DIAGNOSIS — I509 Heart failure, unspecified: Secondary | ICD-10-CM

## 2012-11-03 DIAGNOSIS — I5022 Chronic systolic (congestive) heart failure: Secondary | ICD-10-CM

## 2012-11-03 DIAGNOSIS — I442 Atrioventricular block, complete: Secondary | ICD-10-CM

## 2012-11-03 DIAGNOSIS — I4891 Unspecified atrial fibrillation: Secondary | ICD-10-CM

## 2012-11-03 DIAGNOSIS — I5023 Acute on chronic systolic (congestive) heart failure: Secondary | ICD-10-CM

## 2012-11-03 LAB — ICD DEVICE OBSERVATION
AL AMPLITUDE: 1.6 mv
AL IMPEDENCE ICD: 632 Ohm
ATRIAL PACING ICD: 0 pct
BAMS-0001: 135 {beats}/min
BATTERY VOLTAGE: 2.78 V
LV LEAD IMPEDENCE ICD: 448 Ohm
RV LEAD IMPEDENCE ICD: 664 Ohm
TOT-0002: 1
TOT-0006: 20090617000000
TZAT-0001ATACH: 2
TZAT-0001SLOWVT: 1
TZAT-0001SLOWVT: 2
TZAT-0002ATACH: NEGATIVE
TZAT-0002ATACH: NEGATIVE
TZAT-0004FASTVT: 8
TZAT-0005SLOWVT: 88 pct
TZAT-0005SLOWVT: 91 pct
TZAT-0018ATACH: NEGATIVE
TZAT-0018ATACH: NEGATIVE
TZAT-0018SLOWVT: NEGATIVE
TZAT-0018SLOWVT: NEGATIVE
TZAT-0019ATACH: 6 V
TZAT-0019ATACH: 6 V
TZAT-0019ATACH: 6 V
TZAT-0019FASTVT: 8 V
TZAT-0019SLOWVT: 8 V
TZAT-0020ATACH: 1.5 ms
TZAT-0020FASTVT: 1.5 ms
TZON-0003VSLOWVT: 400 ms
TZON-0004VSLOWVT: 36
TZON-0005SLOWVT: 12
TZST-0001ATACH: 5
TZST-0001FASTVT: 2
TZST-0001FASTVT: 4
TZST-0001FASTVT: 6
TZST-0001SLOWVT: 3
TZST-0002ATACH: NEGATIVE
TZST-0003FASTVT: 30 J
TZST-0003FASTVT: 35 J
TZST-0003FASTVT: 35 J
TZST-0003FASTVT: 35 J
TZST-0003SLOWVT: 20 J
TZST-0003SLOWVT: 35 J
VENTRICULAR PACING ICD: 98.86 pct
VF: 0

## 2012-11-03 NOTE — Assessment & Plan Note (Signed)
His chronic systolic heart failure symptoms are class II. He will continue his current medical therapy. 

## 2012-11-03 NOTE — Patient Instructions (Addendum)
Your physician recommends that you schedule a follow-up appointment in: 3 months in the device clinic and 12 months with Dr Taylor  

## 2012-11-03 NOTE — Assessment & Plan Note (Signed)
His Medtronic biventricular ICD is working normally. We'll plan to recheck in several months. 

## 2012-11-03 NOTE — Assessment & Plan Note (Signed)
His ventricular rate is well controlled. He'll continue his current medical therapy. He is not anticoagulation candidate.
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
## 2012-11-08 ENCOUNTER — Inpatient Hospital Stay (HOSPITAL_COMMUNITY)
Admission: EM | Admit: 2012-11-08 | Discharge: 2012-11-10 | DRG: 291 | Disposition: A | Discharge status: Home / Self Care | Payer: Medicare PPO | Attending: Internal Medicine | Admitting: Internal Medicine

## 2012-11-08 ENCOUNTER — Emergency Department (HOSPITAL_COMMUNITY): Payer: Medicare PPO

## 2012-11-08 ENCOUNTER — Encounter (HOSPITAL_COMMUNITY): Payer: Self-pay

## 2012-11-08 DIAGNOSIS — Z87891 Personal history of nicotine dependence: Secondary | ICD-10-CM

## 2012-11-08 DIAGNOSIS — I509 Heart failure, unspecified: Secondary | ICD-10-CM | POA: Diagnosis present

## 2012-11-08 DIAGNOSIS — I10 Essential (primary) hypertension: Secondary | ICD-10-CM | POA: Diagnosis present

## 2012-11-08 DIAGNOSIS — M25569 Pain in unspecified knee: Secondary | ICD-10-CM | POA: Diagnosis present

## 2012-11-08 DIAGNOSIS — F1010 Alcohol abuse, uncomplicated: Secondary | ICD-10-CM | POA: Diagnosis present

## 2012-11-08 DIAGNOSIS — IMO0002 Reserved for concepts with insufficient information to code with codable children: Secondary | ICD-10-CM | POA: Diagnosis present

## 2012-11-08 DIAGNOSIS — I5023 Acute on chronic systolic (congestive) heart failure: Principal | ICD-10-CM | POA: Diagnosis present

## 2012-11-08 DIAGNOSIS — I5022 Chronic systolic (congestive) heart failure: Secondary | ICD-10-CM

## 2012-11-08 DIAGNOSIS — J9600 Acute respiratory failure, unspecified whether with hypoxia or hypercapnia: Secondary | ICD-10-CM | POA: Diagnosis present

## 2012-11-08 DIAGNOSIS — S2239XA Fracture of one rib, unspecified side, initial encounter for closed fracture: Secondary | ICD-10-CM

## 2012-11-08 DIAGNOSIS — J4489 Other specified chronic obstructive pulmonary disease: Secondary | ICD-10-CM | POA: Diagnosis present

## 2012-11-08 DIAGNOSIS — R0989 Other specified symptoms and signs involving the circulatory and respiratory systems: Secondary | ICD-10-CM

## 2012-11-08 DIAGNOSIS — N179 Acute kidney failure, unspecified: Secondary | ICD-10-CM

## 2012-11-08 DIAGNOSIS — E785 Hyperlipidemia, unspecified: Secondary | ICD-10-CM | POA: Diagnosis present

## 2012-11-08 DIAGNOSIS — Z86718 Personal history of other venous thrombosis and embolism: Secondary | ICD-10-CM

## 2012-11-08 DIAGNOSIS — Z9181 History of falling: Secondary | ICD-10-CM

## 2012-11-08 DIAGNOSIS — Y998 Other external cause status: Secondary | ICD-10-CM

## 2012-11-08 DIAGNOSIS — S42009A Fracture of unspecified part of unspecified clavicle, initial encounter for closed fracture: Secondary | ICD-10-CM

## 2012-11-08 DIAGNOSIS — I2510 Atherosclerotic heart disease of native coronary artery without angina pectoris: Secondary | ICD-10-CM | POA: Diagnosis present

## 2012-11-08 DIAGNOSIS — Z952 Presence of prosthetic heart valve: Secondary | ICD-10-CM

## 2012-11-08 DIAGNOSIS — I472 Ventricular tachycardia: Secondary | ICD-10-CM

## 2012-11-08 DIAGNOSIS — I442 Atrioventricular block, complete: Secondary | ICD-10-CM | POA: Diagnosis present

## 2012-11-08 DIAGNOSIS — F3130 Bipolar disorder, current episode depressed, mild or moderate severity, unspecified: Secondary | ICD-10-CM | POA: Diagnosis present

## 2012-11-08 DIAGNOSIS — I482 Chronic atrial fibrillation, unspecified: Secondary | ICD-10-CM | POA: Diagnosis present

## 2012-11-08 DIAGNOSIS — J449 Chronic obstructive pulmonary disease, unspecified: Secondary | ICD-10-CM

## 2012-11-08 DIAGNOSIS — F411 Generalized anxiety disorder: Secondary | ICD-10-CM | POA: Diagnosis present

## 2012-11-08 DIAGNOSIS — Z7901 Long term (current) use of anticoagulants: Secondary | ICD-10-CM

## 2012-11-08 DIAGNOSIS — S42001A Fracture of unspecified part of right clavicle, initial encounter for closed fracture: Secondary | ICD-10-CM

## 2012-11-08 DIAGNOSIS — I513 Intracardiac thrombosis, not elsewhere classified: Secondary | ICD-10-CM

## 2012-11-08 DIAGNOSIS — J982 Interstitial emphysema: Secondary | ICD-10-CM

## 2012-11-08 DIAGNOSIS — I4891 Unspecified atrial fibrillation: Secondary | ICD-10-CM | POA: Diagnosis present

## 2012-11-08 DIAGNOSIS — Y92410 Unspecified street and highway as the place of occurrence of the external cause: Secondary | ICD-10-CM

## 2012-11-08 DIAGNOSIS — J189 Pneumonia, unspecified organism: Secondary | ICD-10-CM

## 2012-11-08 LAB — CBC WITH DIFFERENTIAL/PLATELET
Basophils Absolute: 0 10*3/uL (ref 0.0–0.1)
HCT: 43.5 % (ref 39.0–52.0)
Lymphocytes Relative: 6 % — ABNORMAL LOW (ref 12–46)
Monocytes Absolute: 1.4 10*3/uL — ABNORMAL HIGH (ref 0.1–1.0)
Neutro Abs: 12.7 10*3/uL — ABNORMAL HIGH (ref 1.7–7.7)
RDW: 15.5 % (ref 11.5–15.5)
WBC: 15.2 10*3/uL — ABNORMAL HIGH (ref 4.0–10.5)

## 2012-11-08 LAB — URINALYSIS W MICROSCOPIC + REFLEX CULTURE
Glucose, UA: NEGATIVE mg/dL
Hgb urine dipstick: NEGATIVE
Ketones, ur: NEGATIVE mg/dL
Protein, ur: NEGATIVE mg/dL

## 2012-11-08 LAB — RAPID URINE DRUG SCREEN, HOSP PERFORMED
Amphetamines: NOT DETECTED
Benzodiazepines: NOT DETECTED
Cocaine: NOT DETECTED
Opiates: NOT DETECTED

## 2012-11-08 LAB — SAMPLE TO BLOOD BANK

## 2012-11-08 LAB — TROPONIN I: Troponin I: <0.3 ng/mL (ref <0.30)

## 2012-11-08 LAB — PRO B NATRIURETIC PEPTIDE: Pro B Natriuretic peptide (BNP): 3408 pg/mL — ABNORMAL HIGH (ref 0–125)

## 2012-11-08 LAB — ETHANOL: Alcohol, Ethyl (B): <11 mg/dL (ref 0–11)

## 2012-11-08 LAB — BASIC METABOLIC PANEL
GFR calc Af Amer: >90 mL/min (ref >90)
GFR calc non Af Amer: 85 mL/min — ABNORMAL LOW (ref >90)
Glucose, Bld: 146 mg/dL — ABNORMAL HIGH (ref 70–99)
Potassium: 4.2 mEq/L (ref 3.5–5.1)
Sodium: 138 mEq/L (ref 135–145)

## 2012-11-08 MED ORDER — LISINOPRIL 5 MG PO TABS
5.0000 mg | ORAL_TABLET | Freq: Two times a day (BID) | ORAL | Status: DC
  Administered 2012-11-08 – 2012-11-09 (×2): 5 mg via ORAL
  Filled 2012-11-08 (×4): qty 1

## 2012-11-08 MED ORDER — DEXTROSE 5 % IV SOLN
1.0000 g | Freq: Once | INTRAVENOUS | Status: AC
  Administered 2012-11-08: 1 g via INTRAVENOUS
  Filled 2012-11-08: qty 10

## 2012-11-08 MED ORDER — LORAZEPAM 2 MG/ML IJ SOLN: 1.0000 mg | Freq: Four times a day (QID) | INTRAMUSCULAR | Status: DC | PRN

## 2012-11-08 MED ORDER — FENTANYL CITRATE 0.05 MG/ML IJ SOLN
50.0000 ug | Freq: Once | INTRAMUSCULAR | Status: AC
  Administered 2012-11-08: 50 ug via INTRAVENOUS
  Filled 2012-11-08: qty 2

## 2012-11-08 MED ORDER — FOLIC ACID 1 MG PO TABS
1.0000 mg | ORAL_TABLET | Freq: Every day | ORAL | Status: DC
  Administered 2012-11-08 – 2012-11-10 (×3): 1 mg via ORAL
  Filled 2012-11-08 (×3): qty 1

## 2012-11-08 MED ORDER — VITAMIN B-1 100 MG PO TABS
100.0000 mg | ORAL_TABLET | Freq: Every day | ORAL | Status: DC
  Administered 2012-11-08 – 2012-11-10 (×3): 100 mg via ORAL
  Filled 2012-11-08 (×4): qty 1

## 2012-11-08 MED ORDER — FUROSEMIDE 10 MG/ML IJ SOLN
80.0000 mg | Freq: Once | INTRAMUSCULAR | Status: AC
  Administered 2012-11-08: 80 mg via INTRAVENOUS
  Filled 2012-11-08: qty 8

## 2012-11-08 MED ORDER — SODIUM CHLORIDE 0.9 % IJ SOLN: 3.0000 mL | INTRAMUSCULAR | Status: DC | PRN

## 2012-11-08 MED ORDER — FUROSEMIDE 10 MG/ML IJ SOLN
40.0000 mg | Freq: Three times a day (TID) | INTRAMUSCULAR | Status: DC
  Administered 2012-11-08 – 2012-11-09 (×3): 40 mg via INTRAVENOUS
  Filled 2012-11-08 (×5): qty 4

## 2012-11-08 MED ORDER — BUDESONIDE-FORMOTEROL FUMARATE 160-4.5 MCG/ACT IN AERO
2.0000 | INHALATION_SPRAY | Freq: Two times a day (BID) | RESPIRATORY_TRACT | Status: DC
  Administered 2012-11-08 – 2012-11-10 (×3): 2 via RESPIRATORY_TRACT
  Filled 2012-11-08 (×3): qty 6

## 2012-11-08 MED ORDER — ENOXAPARIN SODIUM 40 MG/0.4ML ~~LOC~~ SOLN
40.0000 mg | SUBCUTANEOUS | Status: DC
  Administered 2012-11-08 – 2012-11-09 (×2): 40 mg via SUBCUTANEOUS
  Filled 2012-11-08 (×3): qty 0.4

## 2012-11-08 MED ORDER — ACETAMINOPHEN 325 MG PO TABS
650.0000 mg | ORAL_TABLET | ORAL | Status: DC | PRN
  Administered 2012-11-08 – 2012-11-10 (×5): 650 mg via ORAL
  Filled 2012-11-08 (×5): qty 2

## 2012-11-08 MED ORDER — ONDANSETRON HCL 4 MG/2ML IJ SOLN: 4.0000 mg | Freq: Four times a day (QID) | INTRAMUSCULAR | Status: DC | PRN

## 2012-11-08 MED ORDER — ALBUTEROL SULFATE (5 MG/ML) 0.5% IN NEBU
2.5000 mg | INHALATION_SOLUTION | Freq: Four times a day (QID) | RESPIRATORY_TRACT | Status: DC
  Administered 2012-11-08 – 2012-11-09 (×4): 2.5 mg via RESPIRATORY_TRACT
  Filled 2012-11-08 (×4): qty 0.5

## 2012-11-08 MED ORDER — DEXTROSE 5 % IV SOLN
500.0000 mg | Freq: Once | INTRAVENOUS | Status: AC
  Administered 2012-11-08: 500 mg via INTRAVENOUS
  Filled 2012-11-08: qty 500

## 2012-11-08 MED ORDER — IOHEXOL 300 MG/ML  SOLN
80.0000 mL | Freq: Once | INTRAMUSCULAR | Status: AC | PRN
  Administered 2012-11-08: 80 mL via INTRAVENOUS

## 2012-11-08 MED ORDER — IPRATROPIUM BROMIDE 0.02 % IN SOLN
0.5000 mg | Freq: Four times a day (QID) | RESPIRATORY_TRACT | Status: DC
  Administered 2012-11-08 – 2012-11-09 (×4): 0.5 mg via RESPIRATORY_TRACT
  Filled 2012-11-08 (×4): qty 2.5

## 2012-11-08 MED ORDER — ATORVASTATIN CALCIUM 40 MG PO TABS
40.0000 mg | ORAL_TABLET | Freq: Every day | ORAL | Status: DC
  Administered 2012-11-08 – 2012-11-09 (×2): 40 mg via ORAL
  Filled 2012-11-08 (×3): qty 1

## 2012-11-08 MED ORDER — SODIUM CHLORIDE 0.9 % IJ SOLN
3.0000 mL | Freq: Two times a day (BID) | INTRAMUSCULAR | Status: DC
  Administered 2012-11-08 – 2012-11-10 (×4): 3 mL via INTRAVENOUS

## 2012-11-08 MED ORDER — SODIUM CHLORIDE 0.9 % IV SOLN: 250.0000 mL | INTRAVENOUS | Status: DC | PRN

## 2012-11-08 MED ORDER — LORAZEPAM 0.5 MG PO TABS: 1.0000 mg | ORAL_TABLET | Freq: Four times a day (QID) | ORAL | Status: DC | PRN

## 2012-11-08 MED ORDER — CARVEDILOL 6.25 MG PO TABS
6.2500 mg | ORAL_TABLET | Freq: Two times a day (BID) | ORAL | Status: DC
  Administered 2012-11-08 – 2012-11-10 (×4): 6.25 mg via ORAL
  Filled 2012-11-08 (×7): qty 1

## 2012-11-08 MED ORDER — PIPERACILLIN-TAZOBACTAM 3.375 G IVPB
3.3750 g | Freq: Three times a day (TID) | INTRAVENOUS | Status: DC
  Administered 2012-11-08 – 2012-11-09 (×3): 3.375 g via INTRAVENOUS
  Filled 2012-11-08 (×4): qty 50

## 2012-11-08 MED ORDER — THIAMINE HCL 100 MG/ML IJ SOLN
100.0000 mg | Freq: Every day | INTRAMUSCULAR | Status: DC
  Filled 2012-11-08 (×2): qty 1

## 2012-11-08 MED ORDER — ADULT MULTIVITAMIN W/MINERALS CH
1.0000 | ORAL_TABLET | Freq: Every day | ORAL | Status: DC
  Administered 2012-11-08 – 2012-11-10 (×3): 1 via ORAL
  Filled 2012-11-08 (×3): qty 1

## 2012-11-08 MED ORDER — ASPIRIN EC 81 MG PO TBEC
81.0000 mg | DELAYED_RELEASE_TABLET | Freq: Every day | ORAL | Status: DC
  Administered 2012-11-08 – 2012-11-10 (×3): 81 mg via ORAL
  Filled 2012-11-08 (×3): qty 1
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
## 2012-11-08 NOTE — ED Notes (Signed)
Advised EDP in re the patient's work of breathing.

## 2012-11-08 NOTE — ED Notes (Signed)
Attempted report 
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
## 2012-11-08 NOTE — Progress Notes (Signed)
Utilization  Review completed.    Mykelti Goldenstein,RN,BSN   Care Management.    
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
## 2012-11-09 ENCOUNTER — Inpatient Hospital Stay (HOSPITAL_COMMUNITY): Payer: Medicare PPO

## 2012-11-09 DIAGNOSIS — N179 Acute kidney failure, unspecified: Secondary | ICD-10-CM

## 2012-11-09 DIAGNOSIS — I2510 Atherosclerotic heart disease of native coronary artery without angina pectoris: Secondary | ICD-10-CM

## 2012-11-09 LAB — BASIC METABOLIC PANEL
CO2: 30 mEq/L (ref 19–32)
GFR calc Af Amer: 61 mL/min — ABNORMAL LOW (ref >90)
Glucose, Bld: 122 mg/dL — ABNORMAL HIGH (ref 70–99)

## 2012-11-09 MED ORDER — ALBUTEROL SULFATE HFA 108 (90 BASE) MCG/ACT IN AERS
2.0000 | INHALATION_SPRAY | RESPIRATORY_TRACT | Status: DC | PRN
  Filled 2012-11-09: qty 6.7

## 2012-11-09 MED ORDER — MUPIROCIN 2 % EX OINT
TOPICAL_OINTMENT | Freq: Two times a day (BID) | CUTANEOUS | Status: DC
  Administered 2012-11-09 – 2012-11-10 (×3): via TOPICAL
  Filled 2012-11-09: qty 22
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
## 2012-11-10 ENCOUNTER — Encounter: Payer: Self-pay | Admitting: Internal Medicine

## 2012-11-10 DIAGNOSIS — Z7901 Long term (current) use of anticoagulants: Secondary | ICD-10-CM

## 2012-11-10 LAB — BASIC METABOLIC PANEL
Calcium: 9.1 mg/dL (ref 8.4–10.5)
Chloride: 100 mEq/L (ref 96–112)
Creatinine, Ser: 1.15 mg/dL (ref 0.50–1.35)
GFR calc Af Amer: 75 mL/min — ABNORMAL LOW (ref >90)
GFR calc Af Amer: 84 mL/min — ABNORMAL LOW (ref >90)
GFR calc non Af Amer: 65 mL/min — ABNORMAL LOW (ref >90)
GFR calc non Af Amer: 73 mL/min — ABNORMAL LOW (ref >90)
Glucose, Bld: 192 mg/dL — ABNORMAL HIGH (ref 70–99)
Potassium: 6 mEq/L — ABNORMAL HIGH (ref 3.5–5.1)
Potassium: 4.6 mEq/L (ref 3.5–5.1)
Sodium: 134 mEq/L — ABNORMAL LOW (ref 135–145)

## 2012-11-10 LAB — UREA NITROGEN, URINE: Urea Nitrogen, Ur: 611 mg/dL

## 2012-11-10 LAB — CBC
HCT: 41.5 % (ref 39.0–52.0)
RDW: 15.4 % (ref 11.5–15.5)
WBC: 10.5 10*3/uL (ref 4.0–10.5)

## 2012-11-10 LAB — PROCALCITONIN: Procalcitonin: <0.1 ng/mL

## 2012-11-10 LAB — CREATININE, URINE, RANDOM: Creatinine, Urine: 169.03 mg/dL

## 2012-11-10 MED ORDER — ATORVASTATIN CALCIUM 40 MG PO TABS: 40.0000 mg | ORAL_TABLET | Freq: Every day | ORAL | Status: DC

## 2012-11-10 MED ORDER — FUROSEMIDE 10 MG/ML IJ SOLN
40.0000 mg | Freq: Once | INTRAMUSCULAR | Status: AC
  Administered 2012-11-10: 40 mg via INTRAVENOUS
  Filled 2012-11-10: qty 4

## 2012-11-10 MED ORDER — SODIUM CHLORIDE 0.9 % IV SOLN
2.0000 g | Freq: Once | INTRAVENOUS | Status: AC
  Administered 2012-11-10: 2 g via INTRAVENOUS
  Filled 2012-11-10: qty 20

## 2012-11-10 MED ORDER — ALBUTEROL SULFATE HFA 108 (90 BASE) MCG/ACT IN AERS: 2.0000 | INHALATION_SPRAY | RESPIRATORY_TRACT | Status: DC | PRN

## 2012-11-10 MED ORDER — CARVEDILOL 6.25 MG PO TABS: 6.2500 mg | ORAL_TABLET | Freq: Two times a day (BID) | ORAL | Status: DC

## 2012-11-10 MED ORDER — MUPIROCIN 2 % EX OINT: TOPICAL_OINTMENT | Freq: Two times a day (BID) | CUTANEOUS | Status: DC

## 2012-11-10 MED ORDER — BUDESONIDE-FORMOTEROL FUMARATE 160-4.5 MCG/ACT IN AERO: 2.0000 | INHALATION_SPRAY | Freq: Two times a day (BID) | RESPIRATORY_TRACT | Status: DC

## 2012-11-10 MED ORDER — ASPIRIN 81 MG PO TBEC: 81.0000 mg | DELAYED_RELEASE_TABLET | Freq: Every day | ORAL | Status: DC
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
## 2012-11-21 IMAGING — CT CT HEAD W/O CM
2 series · 16 of 30 positions shown, 19 images · non-contrast
Comparison: Head CT 07/24/2011.

CLINICAL DATA: Medical clearance.  With altered mental status.

CT HEAD WITHOUT CONTRAST
TECHNIQUE: Contiguous axial images were obtained from the base of
the skull through the vertex without contrast.

[Series 2: head w/o · axial · non-contrast · 0.39mm/px · z∈[+1326,+1446]mm · 9 of 31 slices shown, 12 images]
[im 4/31  brain]
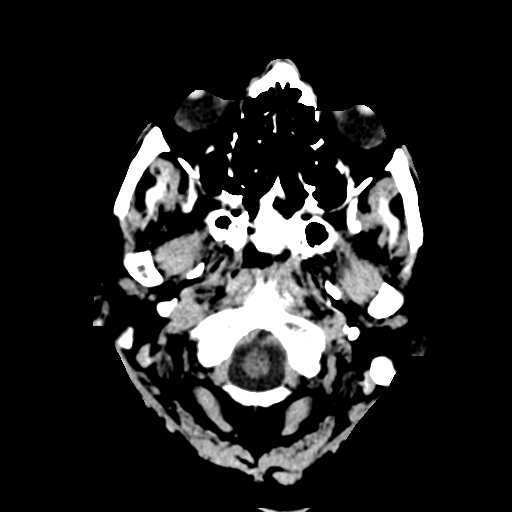
[im 4/31  bone]
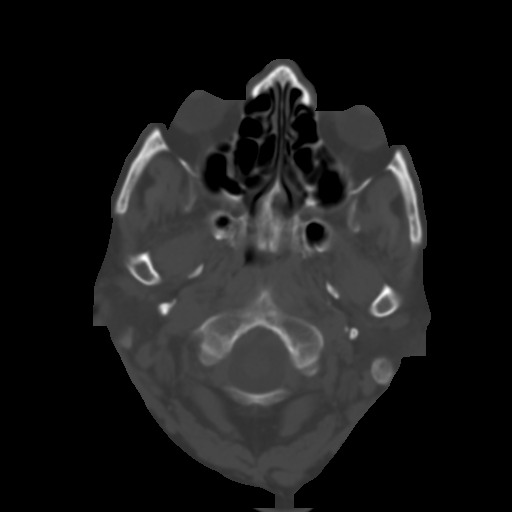
[im 7/31  brain]
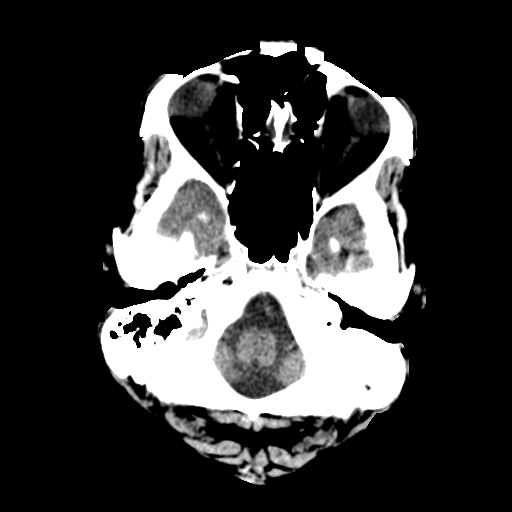
[im 10/31  brain]
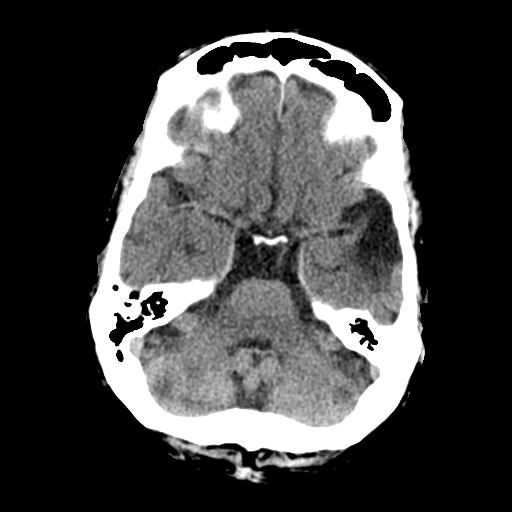
[im 13/31  brain]
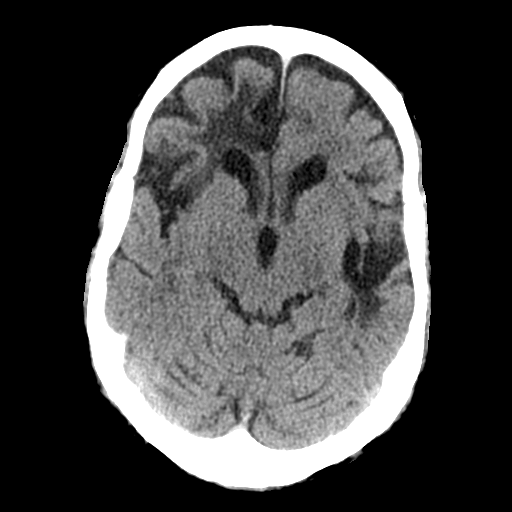
[im 16/31  brain]
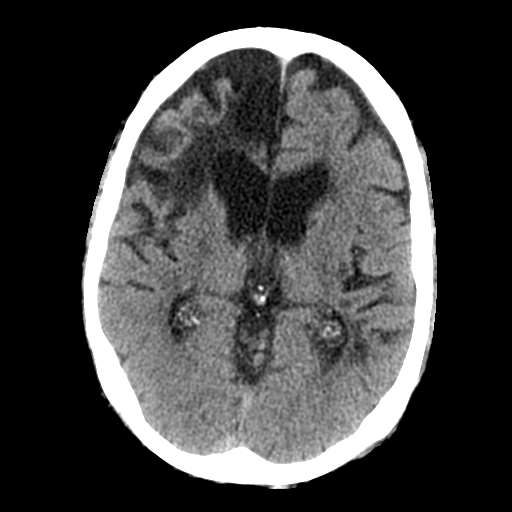
[im 16/31  bone]
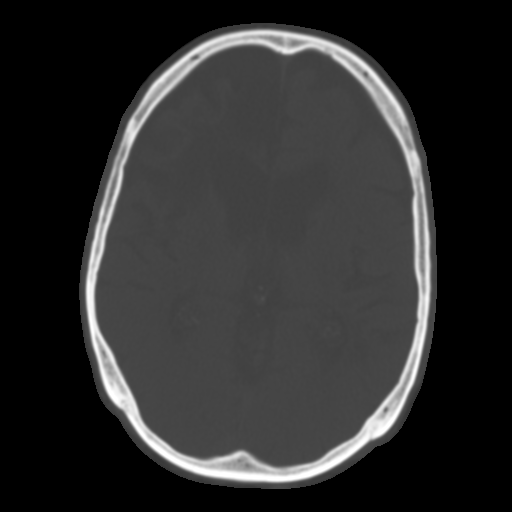
[im 19/31  brain]
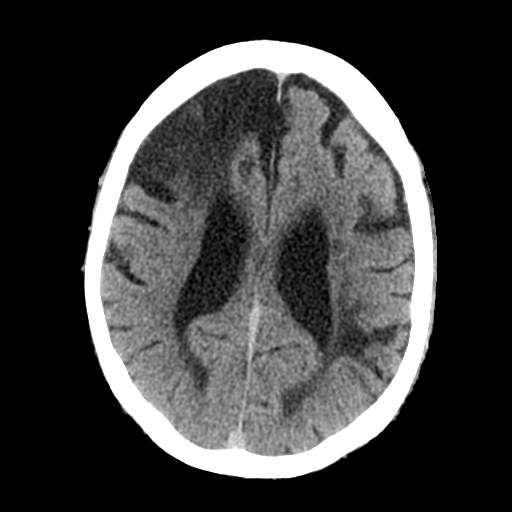
[im 22/31  brain]
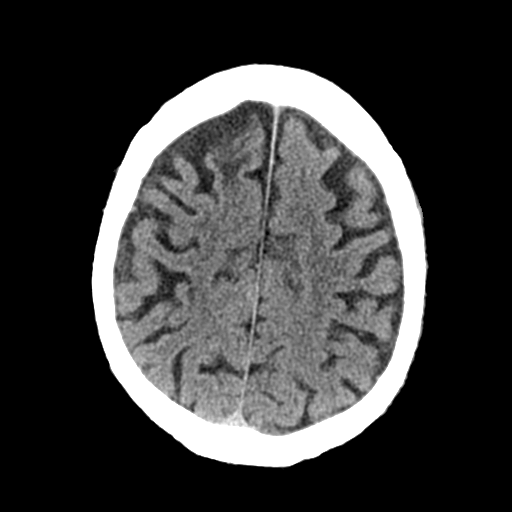
[im 25/31  brain]
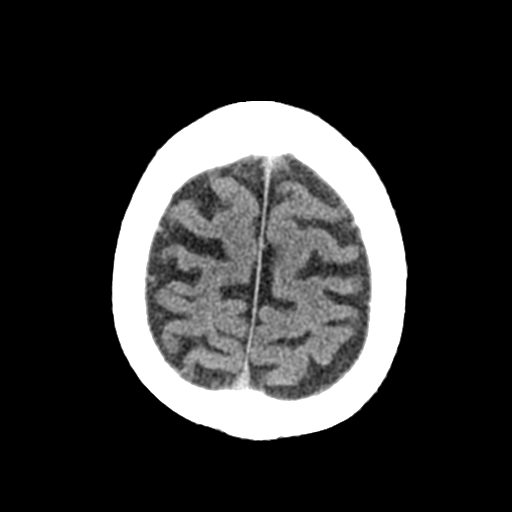
[im 28/31  brain]
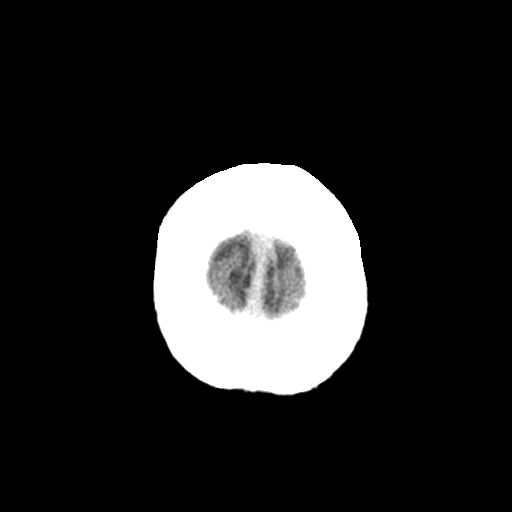
[im 28/31  bone]
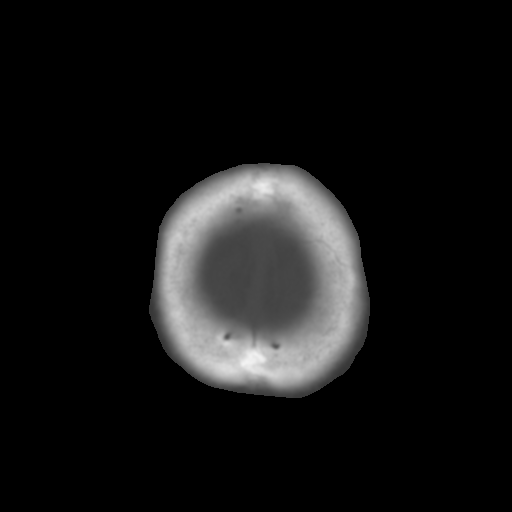

[Series 3: bone windows · axial · 0.39mm/px · z∈[+1326,+1428]mm · 7 of 52 slices shown]
[im 6/52  bone]
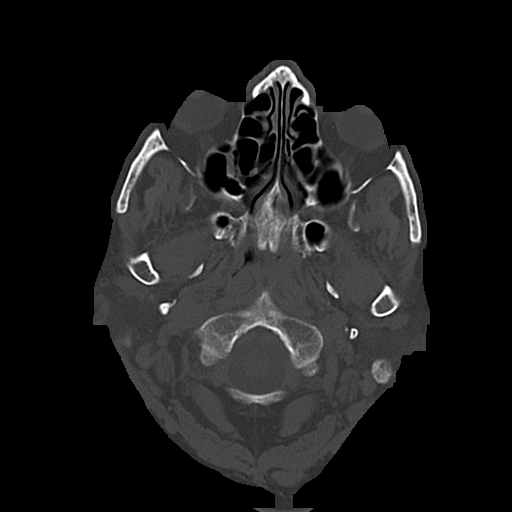
[im 12/52  bone]
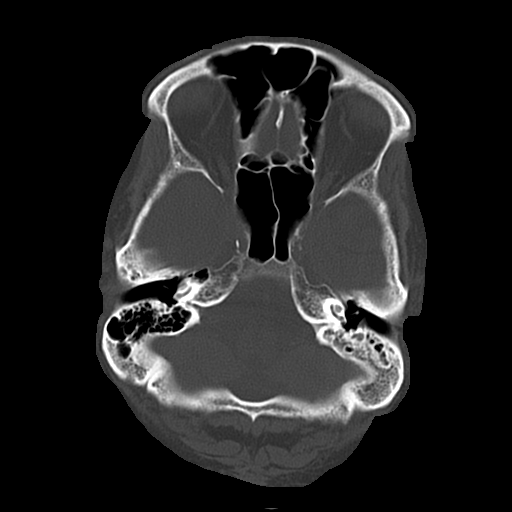
[im 18/52  bone]
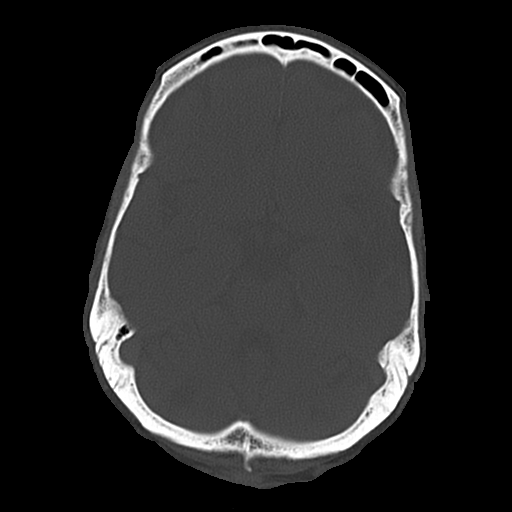
[im 23/52  bone]
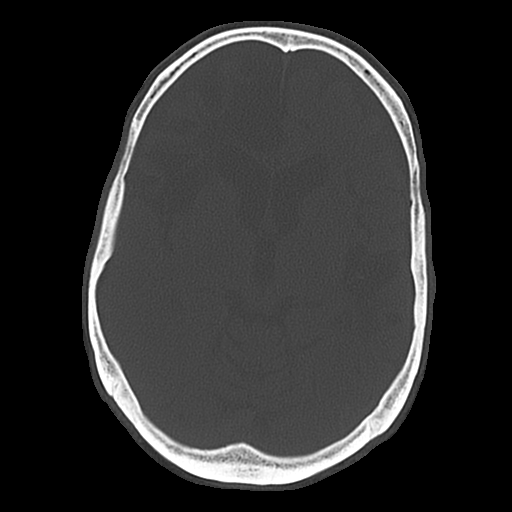
[im 29/52  bone]
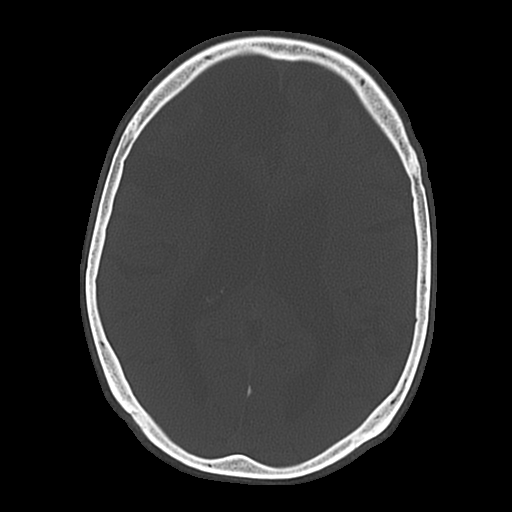
[im 35/52  bone]
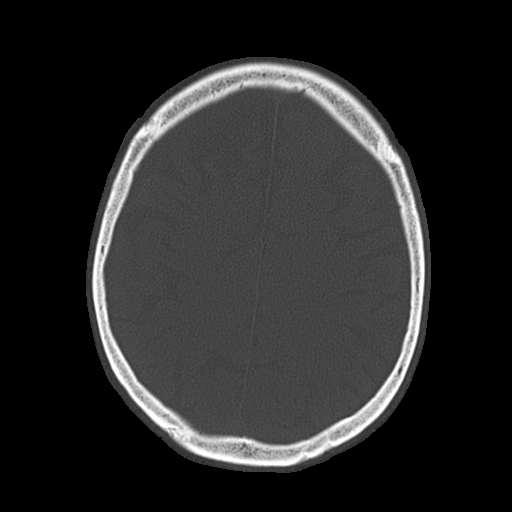
[im 40/52  bone]
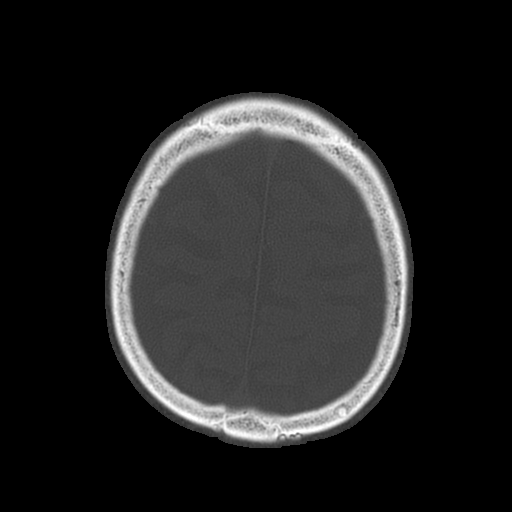

[16 of 30 positions shown; findings below may reference images not displayed]

FINDINGS: Again noted are areas of encephalomalacia in the left
parietal/temporal lobe and right frontal lobe.  Age advanced
cerebral atrophy is again noted.  Patchy and confluent areas of
decreased attenuation throughout the deep and periventricular white
matter of the cerebral hemispheres bilaterally are compatible with
chronic microvascular ischemic changes (similar to prior).  No
definite acute intracranial abnormality.  Specifically, no definite
evidence of acute/subacute cerebral ischemia, no evidence of acute
intracerebral hemorrhage, no focal mass, mass effect, hydrocephalus
or abnormal intra or extra-axial fluid collections.  No acute
displaced skull fractures are identified.  Visualized paranasal
sinuses and mastoids are well pneumatized.
IMPRESSION: 1. No acute intracranial abnormalities.
2.  The appearance of the brain is very similar to prior study
07/24/2011, as detailed above.

## 2012-11-21 IMAGING — CR DG CHEST 2V
2 series · 2 of 2 positions shown · non-contrast
Comparison: Chest x-ray 08/21/2011.

CLINICAL DATA: Altered mental status.  Weakness.

CHEST - 2 VIEW

[w chest pa]
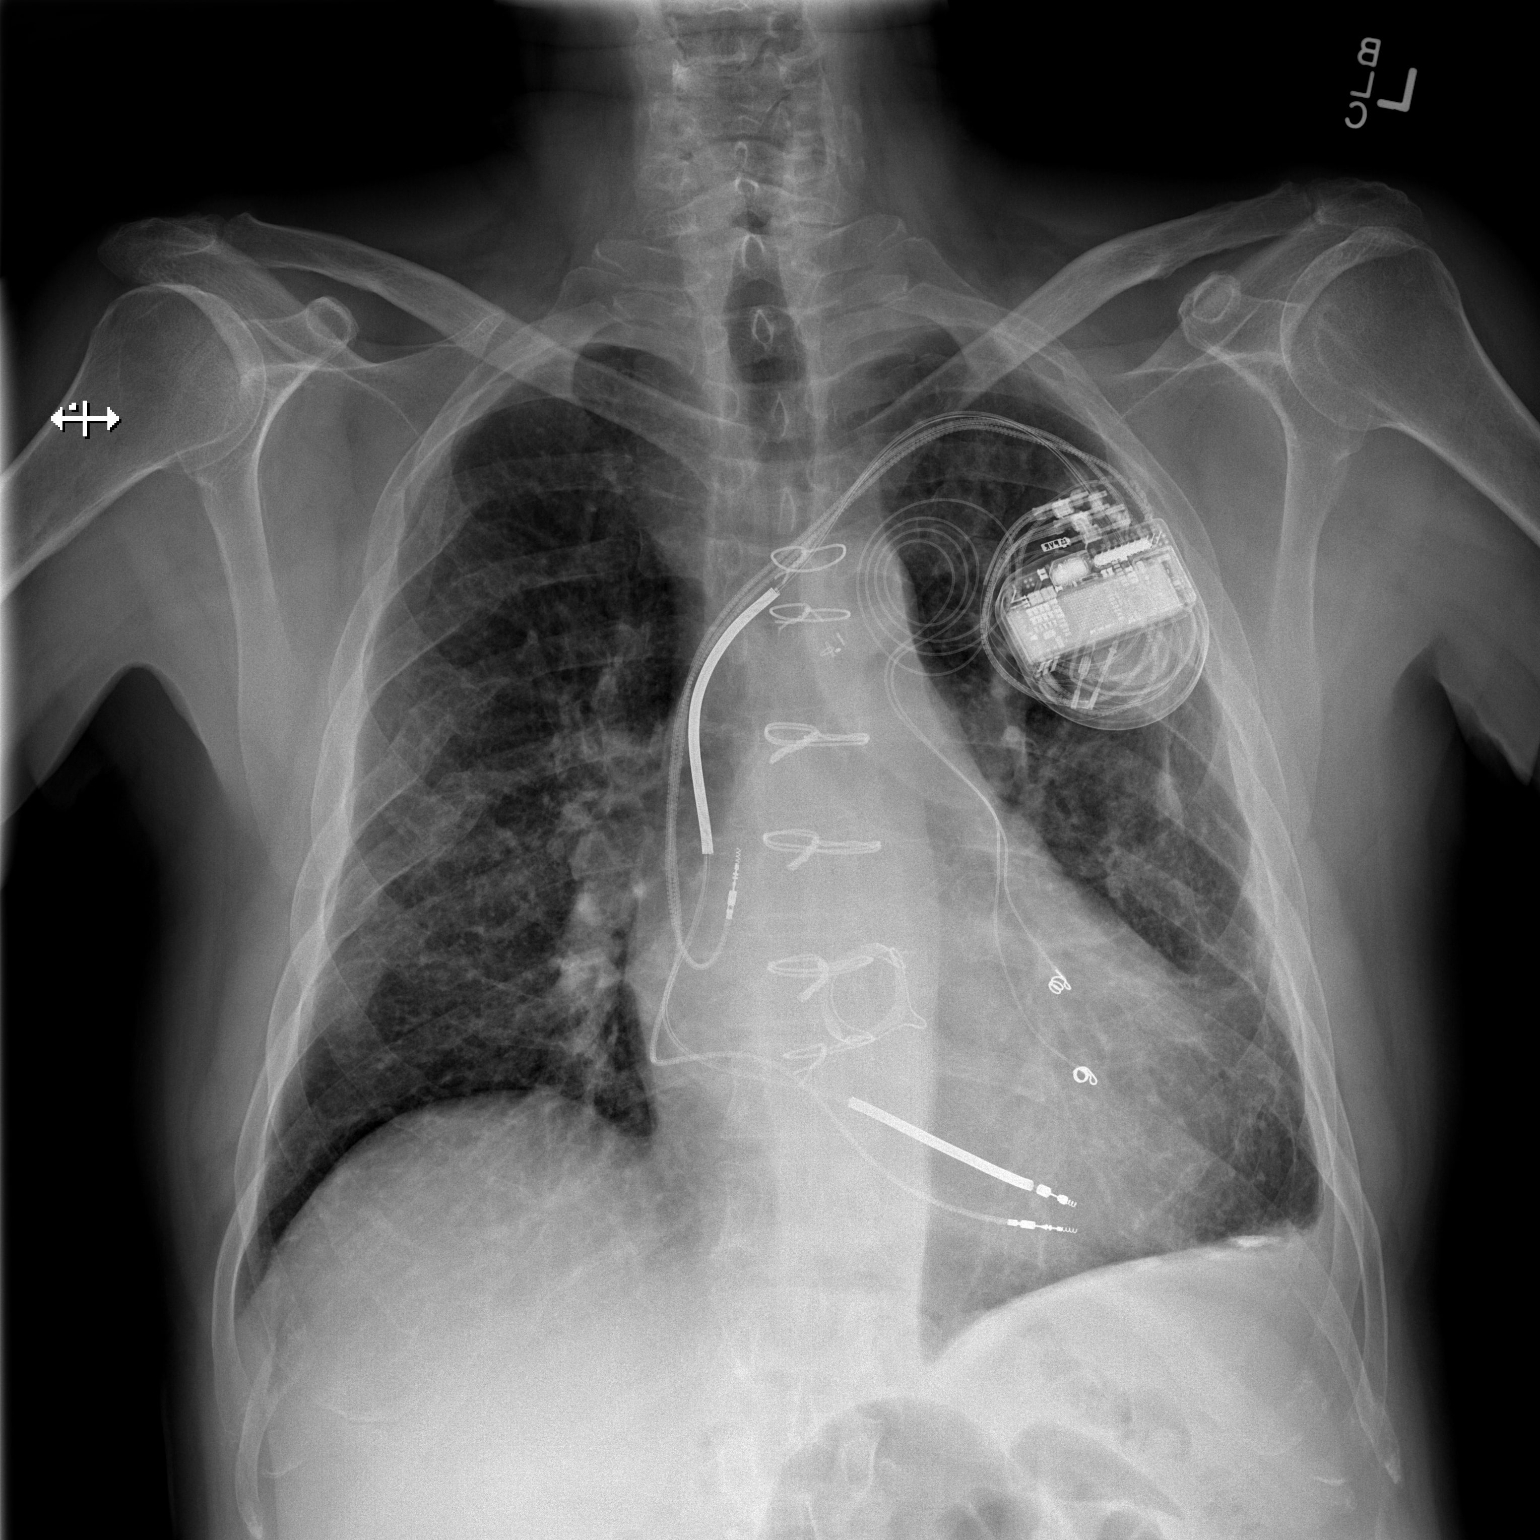

[w chest lat]
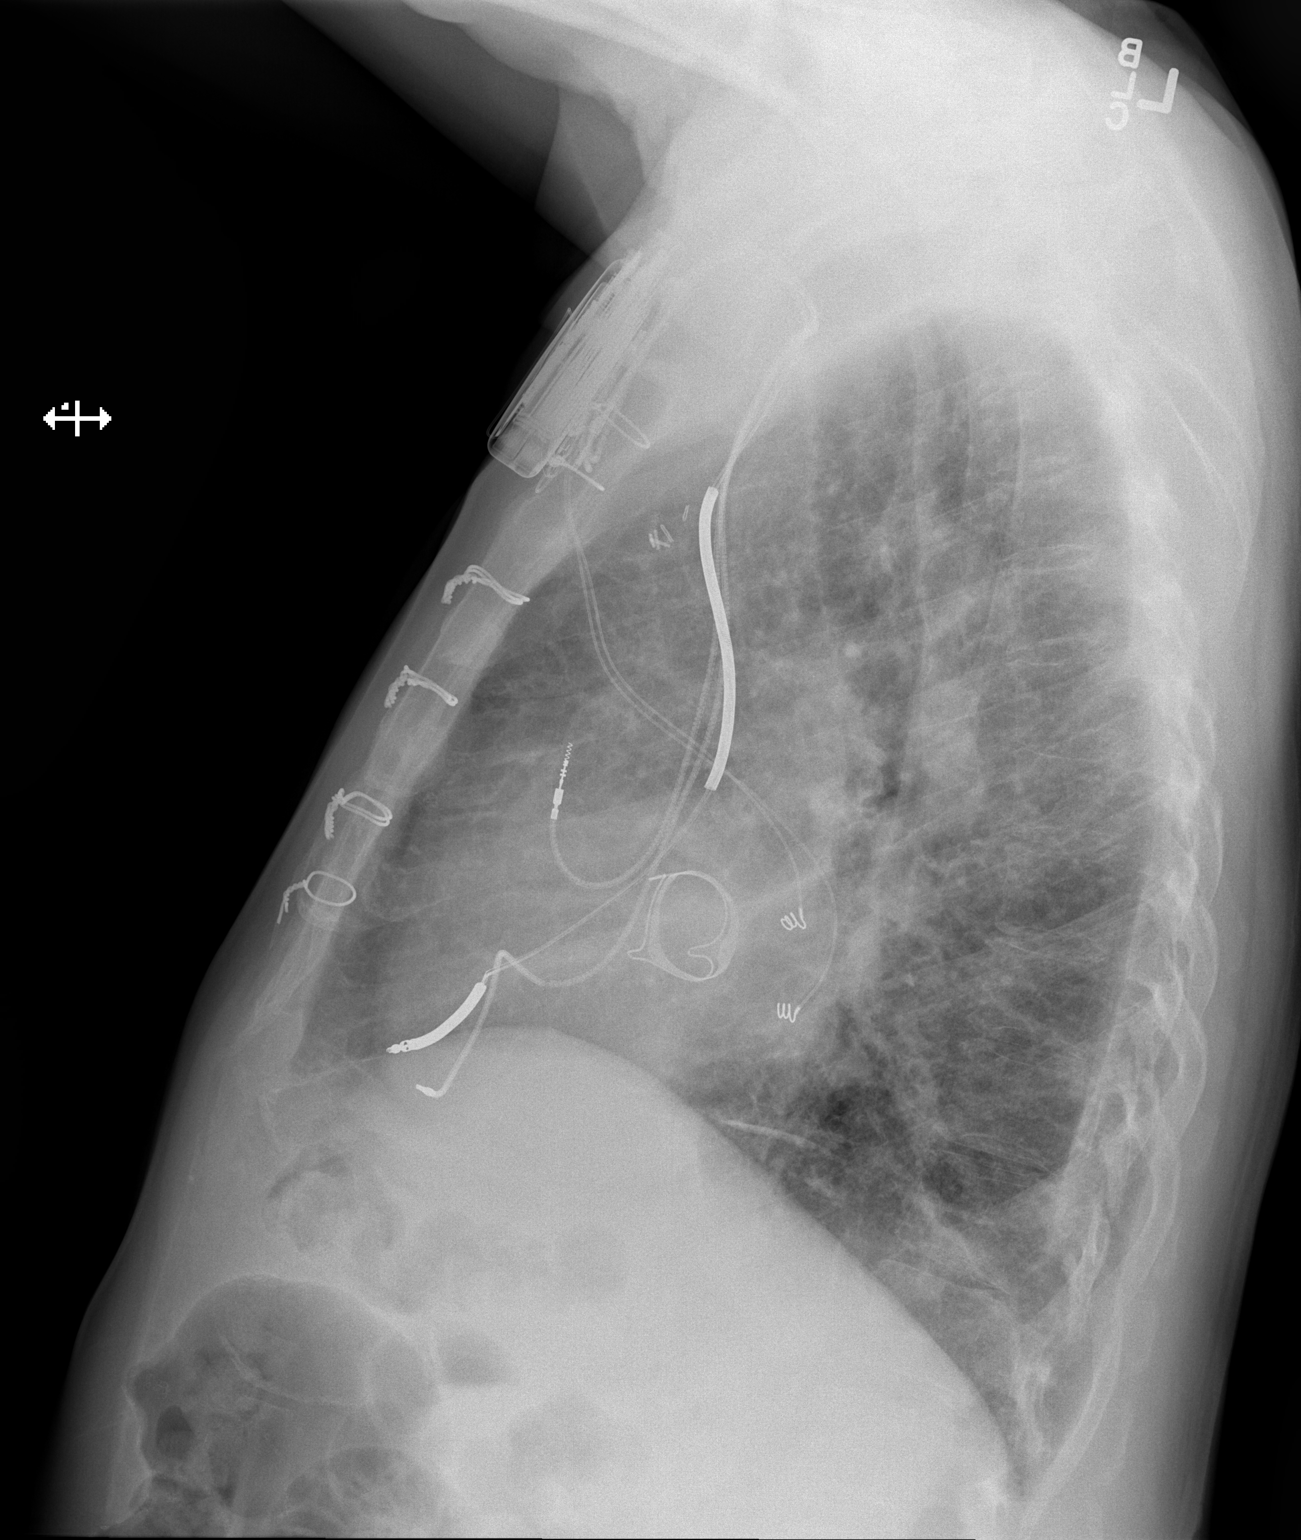

[2 of 2 positions shown; findings below may reference images not displayed]

FINDINGS: Calcified pleural plaques at the base of the left
hemithorax.  Lung volumes are normal.  A small left pleural
effusion versus chronic pleural scarring, unchanged.  No right
pleural effusion.  No acute consolidative airspace disease.  There
is some mild cephalization of the pulmonary vasculature and mild
indistinctness of the interstitial markings, which could suggest
very mild interstitial pulmonary edema.  Heart size is mildly
enlarged (unchanged).  Left-sided pacemaker/AICD is in position
with lead tips projecting over the expected location of the right
atrium, right ventricular apex, and overlying the lateral wall of
the left ventricle (epicardial leads).  Status post median
sternotomy for mitral valve replacement (there appears to be a
stented bioprosthesis in position). Multiple old healed right-sided
rib fractures are again noted.
IMPRESSION: 1.  The appearance of the chest suggest very mild congestive heart
failure with only mild interstitial pulmonary edema at this time.
2.  Probable chronic left pleural scarring is similar to the prior
examination.  A small left pleural effusion is difficult to
exclude, but is not favored.
3.  Postoperative changes and support apparatus, as above.

## 2013-02-02 ENCOUNTER — Ambulatory Visit (INDEPENDENT_AMBULATORY_CARE_PROVIDER_SITE_OTHER): Payer: Medicare PPO | Admitting: *Deleted

## 2013-02-02 DIAGNOSIS — I442 Atrioventricular block, complete: Secondary | ICD-10-CM

## 2013-02-02 DIAGNOSIS — I5023 Acute on chronic systolic (congestive) heart failure: Secondary | ICD-10-CM

## 2013-02-02 DIAGNOSIS — I4891 Unspecified atrial fibrillation: Secondary | ICD-10-CM

## 2013-02-02 DIAGNOSIS — I509 Heart failure, unspecified: Secondary | ICD-10-CM

## 2013-02-02 LAB — ICD DEVICE OBSERVATION
AL AMPLITUDE: 1.8542 mv
AL IMPEDENCE ICD: 656 Ohm
ATRIAL PACING ICD: 0 pct
CHARGE TIME: 12.101 s
LV LEAD IMPEDENCE ICD: 448 Ohm
RV LEAD IMPEDENCE ICD: 840 Ohm
TOT-0001: 2
TOT-0006: 20090617000000
TZAT-0001FASTVT: 1
TZAT-0001SLOWVT: 1
TZAT-0001SLOWVT: 2
TZAT-0002ATACH: NEGATIVE
TZAT-0002ATACH: NEGATIVE
TZAT-0004FASTVT: 8
TZAT-0011SLOWVT: 10 ms
TZAT-0013FASTVT: 2
TZAT-0018ATACH: NEGATIVE
TZAT-0018SLOWVT: NEGATIVE
TZAT-0018SLOWVT: NEGATIVE
TZAT-0019ATACH: 6 V
TZAT-0019ATACH: 6 V
TZAT-0019FASTVT: 8 V
TZAT-0019SLOWVT: 8 V
TZAT-0019SLOWVT: 8 V
TZAT-0020ATACH: 1.5 ms
TZAT-0020ATACH: 1.5 ms
TZAT-0020FASTVT: 1.5 ms
TZAT-0020SLOWVT: 1.5 ms
TZAT-0020SLOWVT: 1.5 ms
TZON-0003VSLOWVT: 400 ms
TZON-0005SLOWVT: 12
TZST-0001ATACH: 5
TZST-0001ATACH: 6
TZST-0001FASTVT: 2
TZST-0001FASTVT: 4
TZST-0001FASTVT: 6
TZST-0001SLOWVT: 4
TZST-0001SLOWVT: 5
TZST-0002ATACH: NEGATIVE
TZST-0002ATACH: NEGATIVE
TZST-0003FASTVT: 30 J
TZST-0003FASTVT: 35 J
TZST-0003FASTVT: 35 J
TZST-0003FASTVT: 35 J
TZST-0003SLOWVT: 20 J
TZST-0003SLOWVT: 35 J
TZST-0003SLOWVT: 35 J
VENTRICULAR PACING ICD: 98.64 pct

## 2013-02-02 NOTE — Progress Notes (Signed)
ICD check with ICM in office. 

## 2013-03-07 ENCOUNTER — Encounter: Payer: Self-pay | Admitting: Internal Medicine

## 2013-03-21 IMAGING — CR DG CHEST 2V
2 series · 2 of 2 positions shown · non-contrast
Comparison: 06/13/2012, [DATE] hours.

CLINICAL DATA: Short of breath.  Congestion.

CHEST - 2 VIEW

[w chest pa]
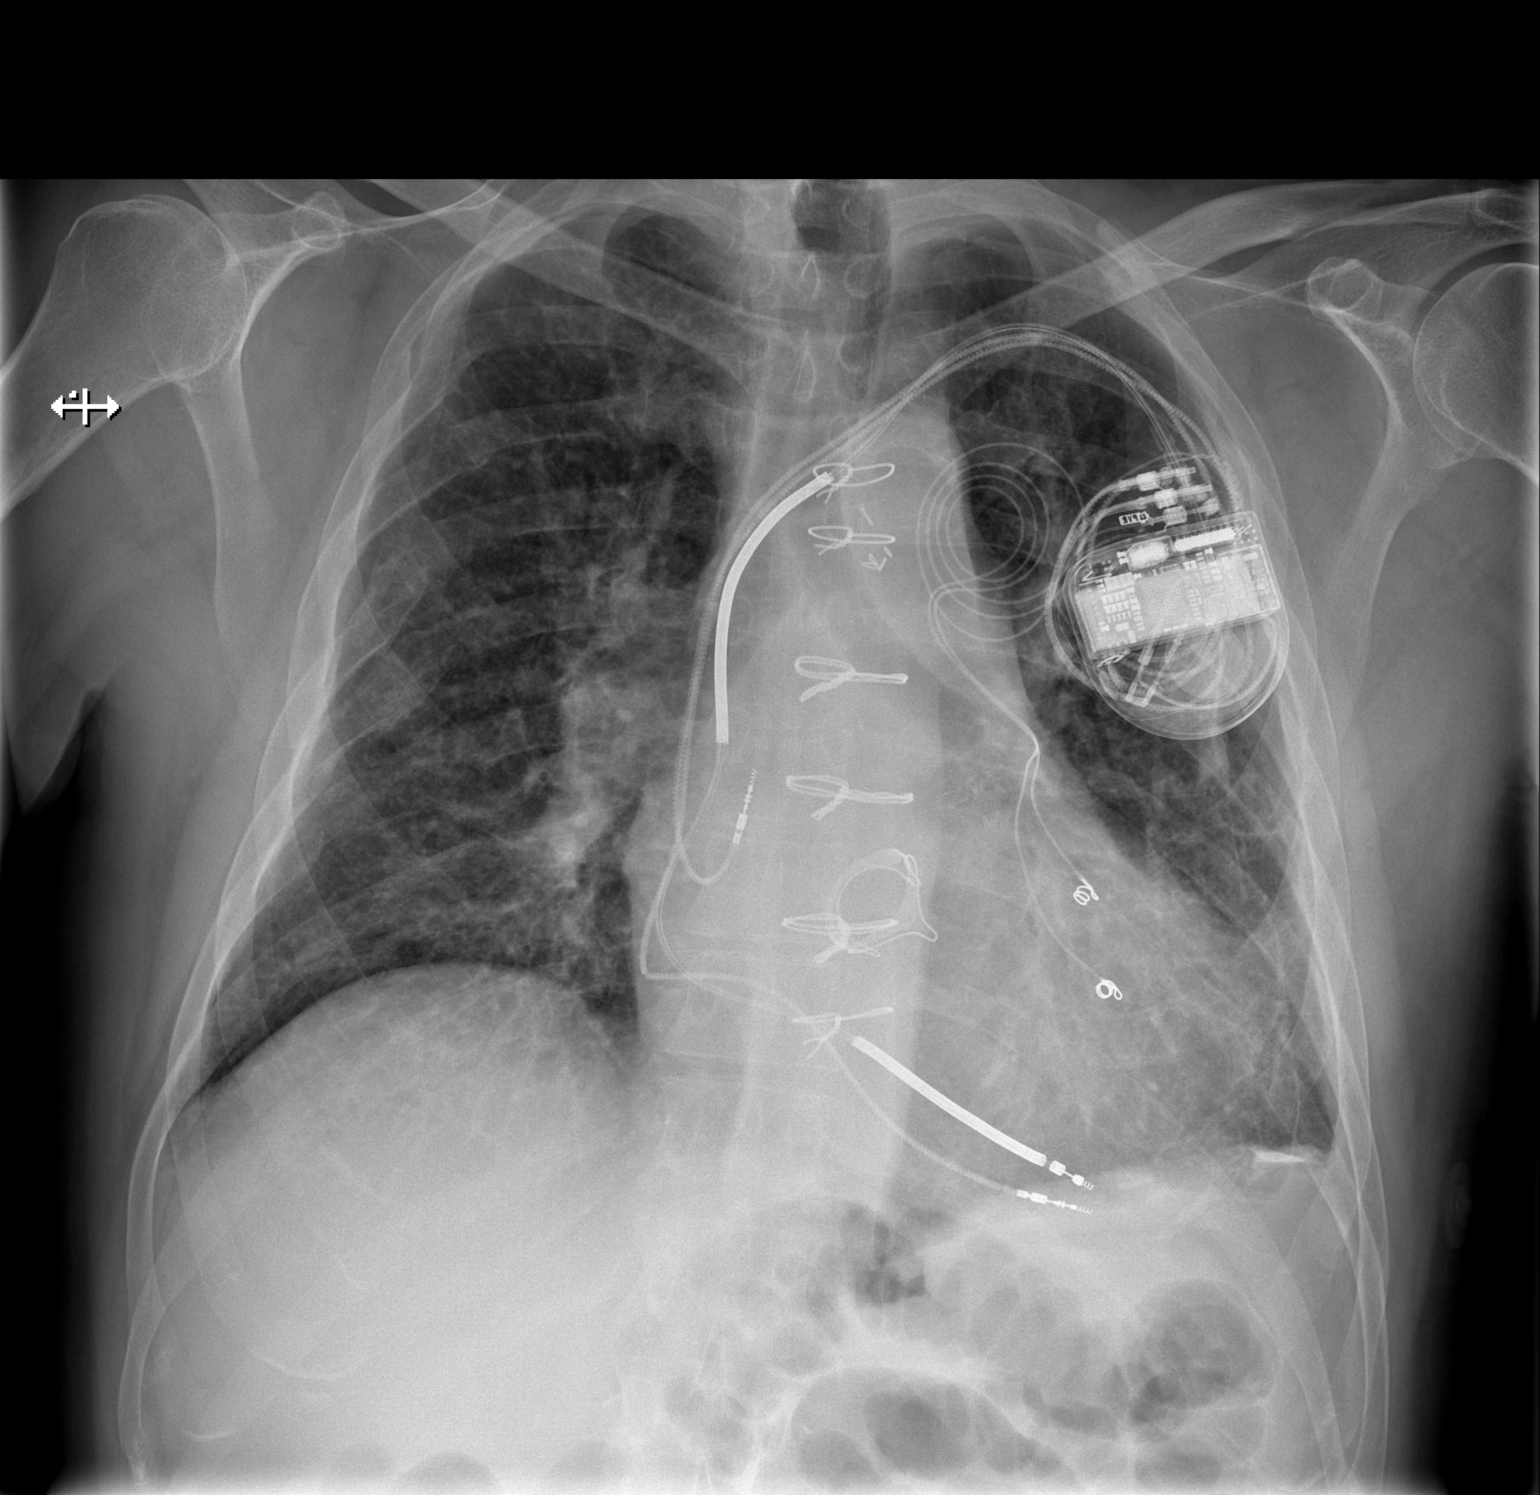

[w chest lat]
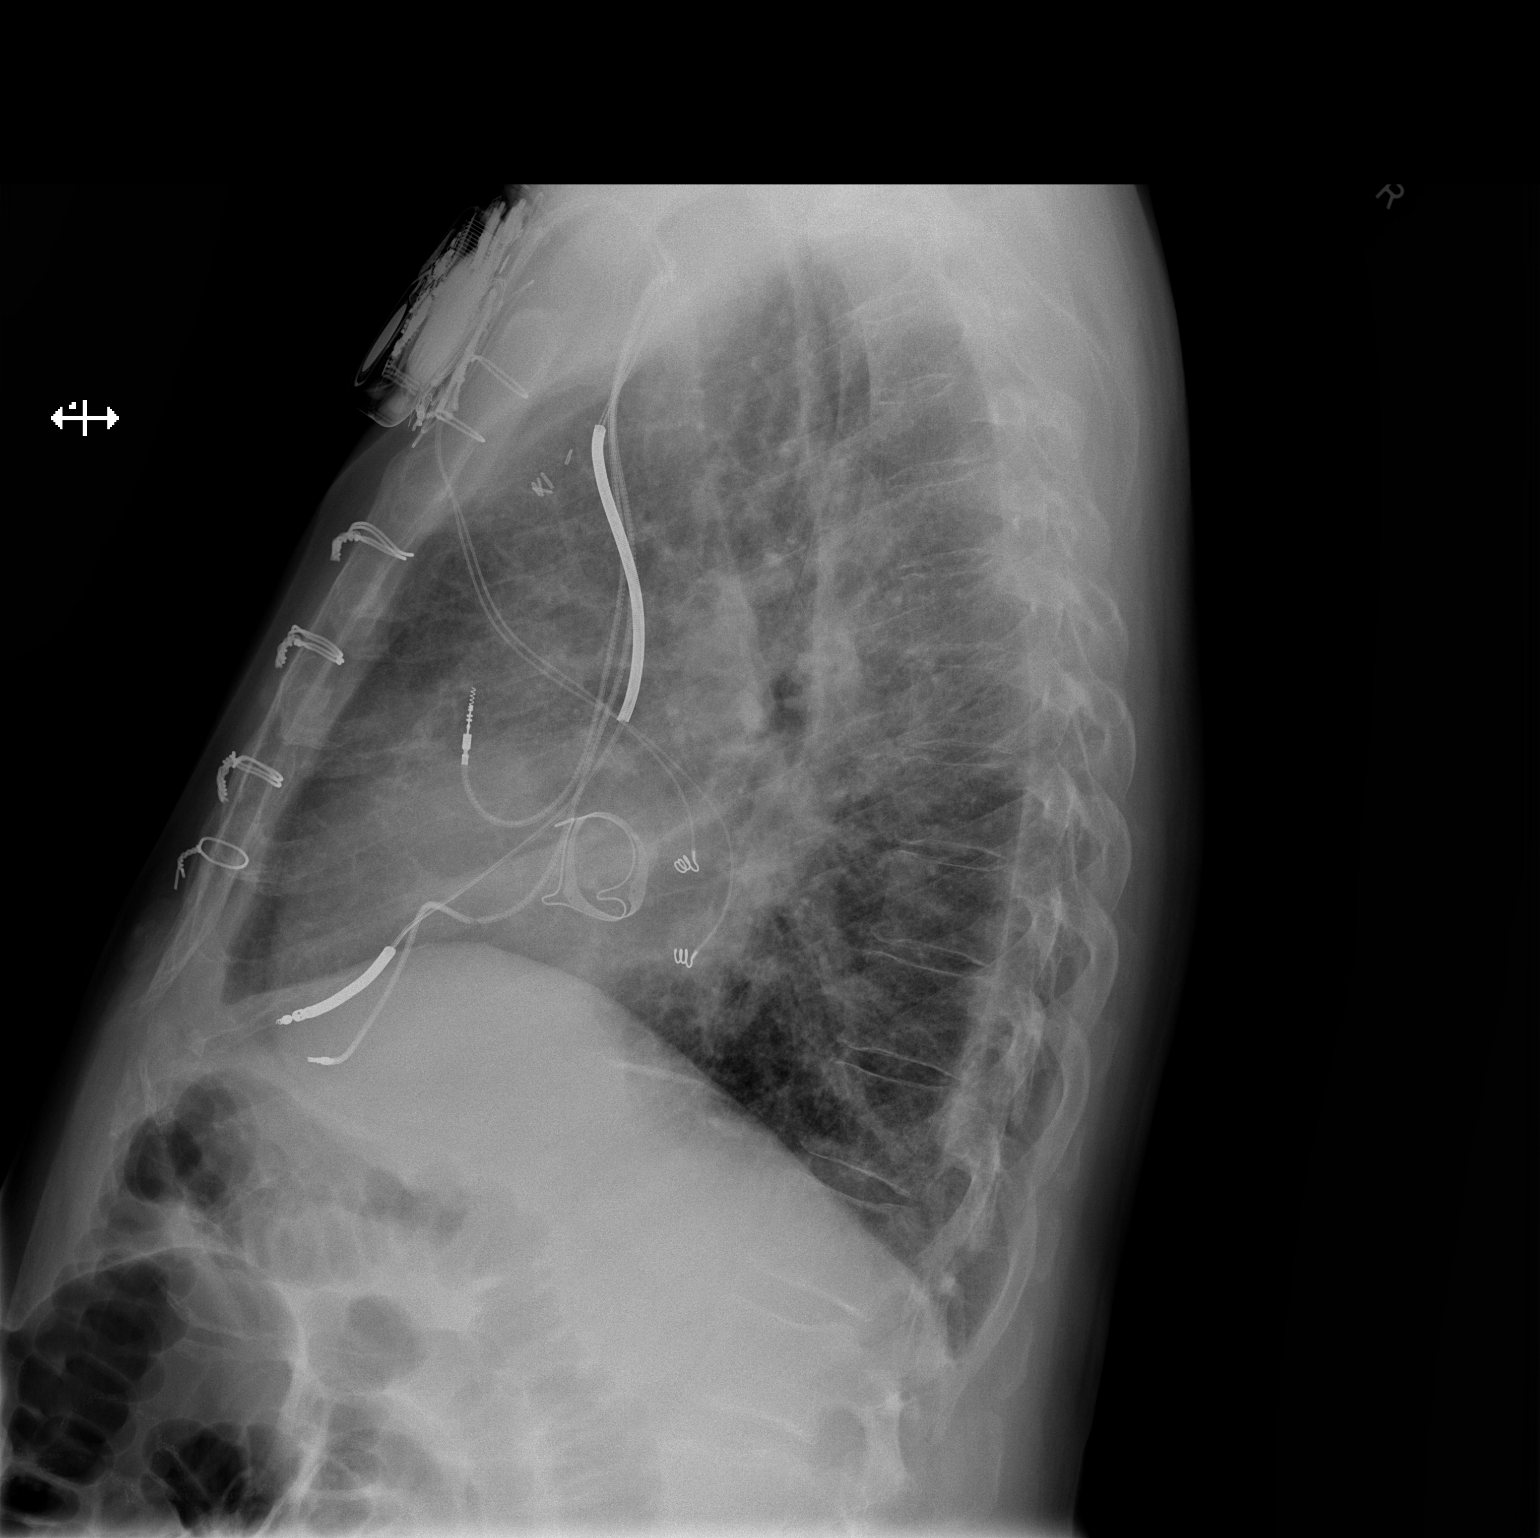

[2 of 2 positions shown; findings below may reference images not displayed]

FINDINGS: No interval change compared to prior exam.  Cardiomegaly
and pulmonary vascular congestion similar.  Support apparatus also
appears similar.
IMPRESSION: No interval change.  Cardiomegaly and pulmonary
vascular congestion.

## 2013-05-08 ENCOUNTER — Ambulatory Visit (INDEPENDENT_AMBULATORY_CARE_PROVIDER_SITE_OTHER): Payer: Medicare PPO | Admitting: *Deleted

## 2013-05-08 DIAGNOSIS — I5022 Chronic systolic (congestive) heart failure: Secondary | ICD-10-CM

## 2013-05-08 DIAGNOSIS — I472 Ventricular tachycardia: Secondary | ICD-10-CM

## 2013-05-08 DIAGNOSIS — I442 Atrioventricular block, complete: Secondary | ICD-10-CM

## 2013-05-08 DIAGNOSIS — I509 Heart failure, unspecified: Secondary | ICD-10-CM

## 2013-05-08 LAB — MDC_IDC_ENUM_SESS_TYPE_INCLINIC
Battery Voltage: 2.65 V
Brady Statistic AP VS Percent: 0 %
Brady Statistic AS VP Percent: 97.49 %
HighPow Impedance: 52 Ohm
Lead Channel Pacing Threshold Amplitude: 1 V
Lead Channel Pacing Threshold Amplitude: 1.5 V
Lead Channel Setting Pacing Amplitude: 2.5 V
Lead Channel Setting Pacing Amplitude: 2.5 V
Lead Channel Setting Pacing Pulse Width: 0.4 ms
Lead Channel Setting Pacing Pulse Width: 0.4 ms
Zone Setting Detection Interval: 270 ms
Zone Setting Detection Interval: 400 ms
Zone Setting Detection Interval: 450 ms

## 2013-05-08 NOTE — Progress Notes (Signed)
CRT-D device check in office. Thresholds and sensing consistent with previous device measurements. Lead impedance trends stable over time. 100% A-fib, - coumadin due to fall risk.   No ventricular arrhythmia episodes recorded. Patient bi-ventricularly pacing >97.6% of the time. Device programmed with appropriate safety margins. Heart failure diagnostics reviewed.  Optivol and thoracic impedance abnormal 11/15 ongoing, Dr. Ladona Ridgel aware.   Audible/vibratory alerts demonstrated for patient. No changes made this session.  Patient education completed including shock plan.  ROV in February with the device clinic.

## 2013-05-23 ENCOUNTER — Other Ambulatory Visit: Payer: Self-pay | Admitting: Family

## 2013-05-23 ENCOUNTER — Ambulatory Visit
Admission: RE | Admit: 2013-05-23 | Discharge: 2013-05-23 | Disposition: A | Discharge status: Home / Self Care | Payer: Commercial Managed Care - HMO | Source: Ambulatory Visit | Attending: Family | Admitting: Family

## 2013-05-23 DIAGNOSIS — I509 Heart failure, unspecified: Secondary | ICD-10-CM

## 2013-05-23 DIAGNOSIS — R05 Cough: Secondary | ICD-10-CM

## 2013-05-30 ENCOUNTER — Encounter: Payer: Self-pay | Admitting: Internal Medicine

## 2013-06-17 IMAGING — CR DG CHEST 2V
2 series · 2 of 2 positions shown · non-contrast
Comparison: 06/13/2012

CLINICAL DATA: Shortness of breath and chest pain.

CHEST - 2 VIEW

[w chest pa]
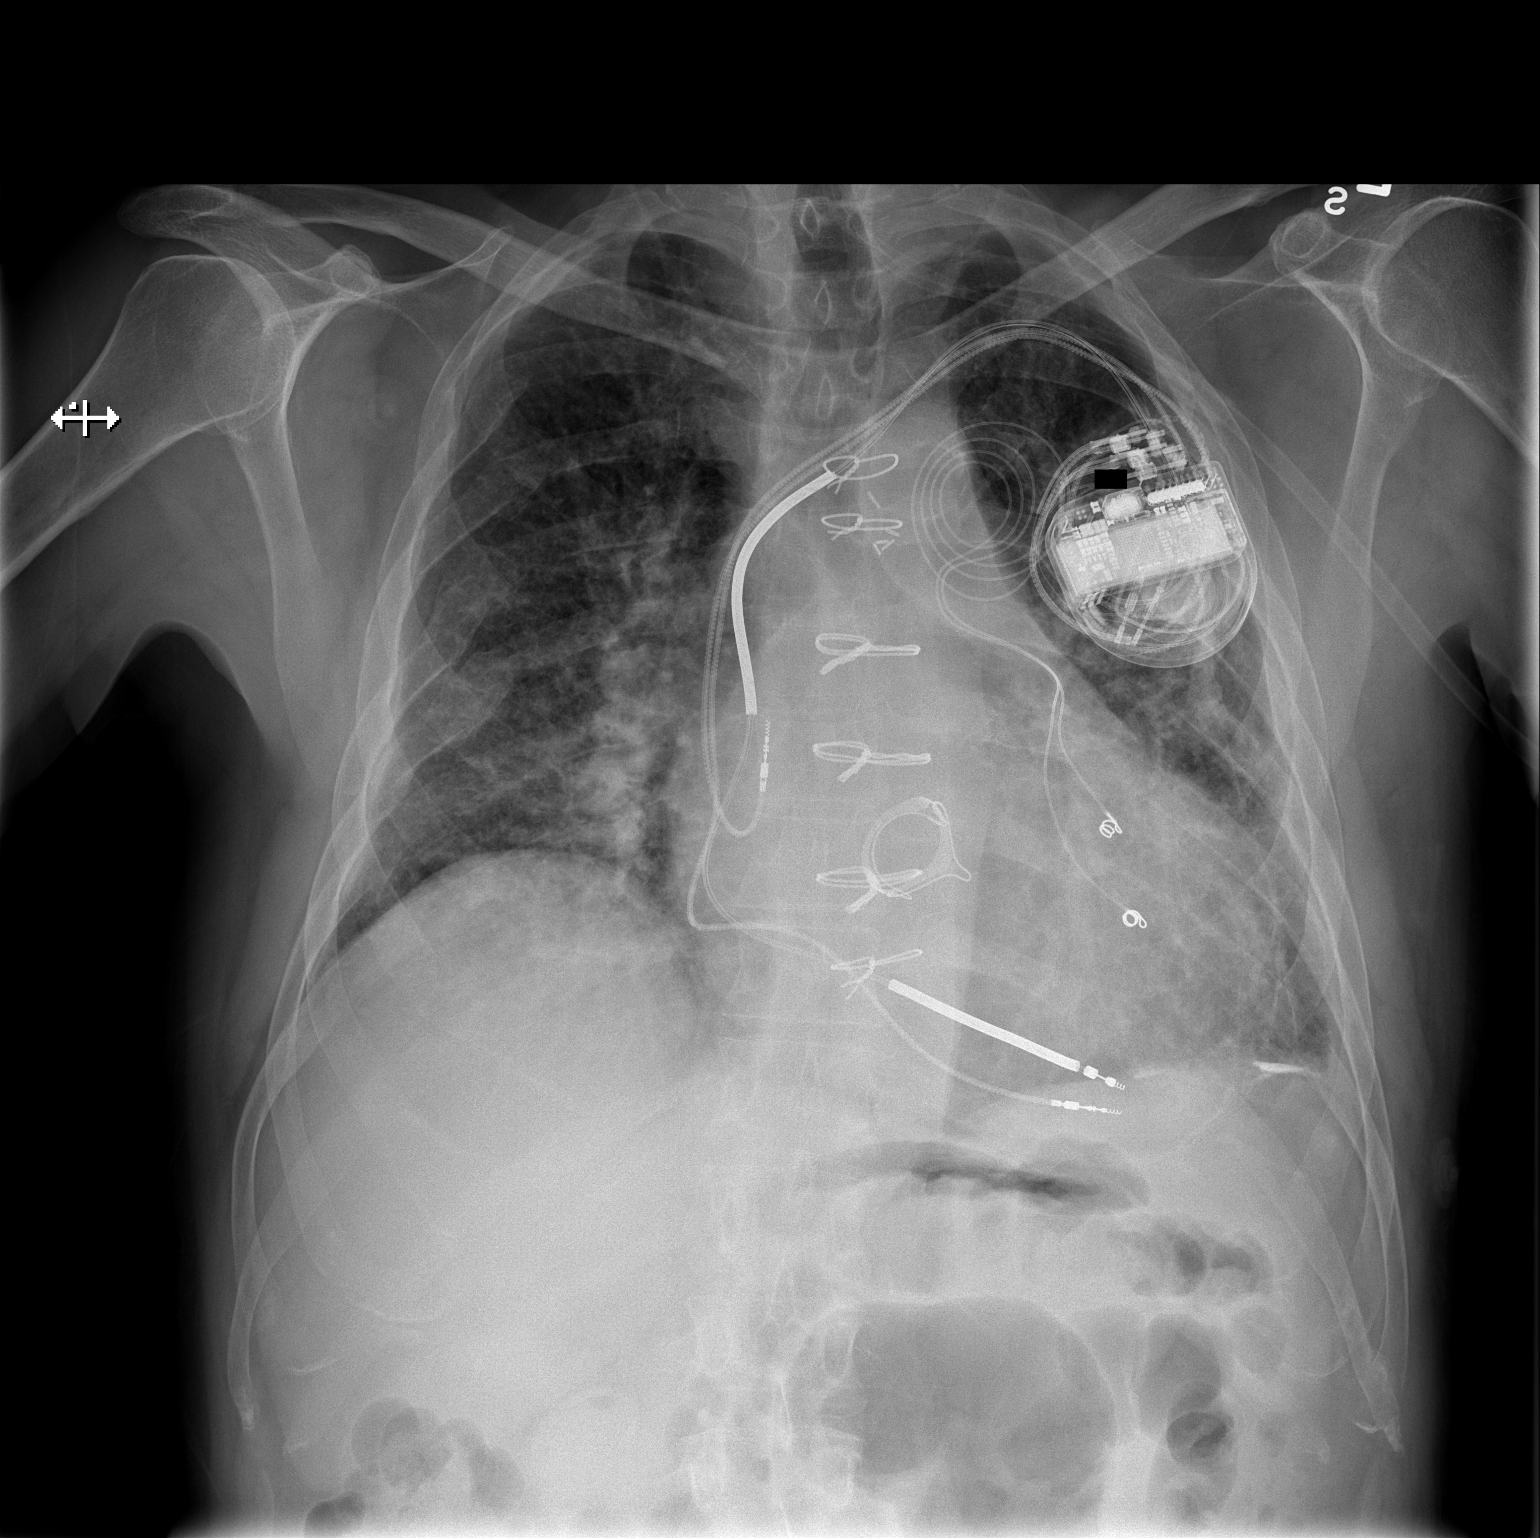

[w chest lat]
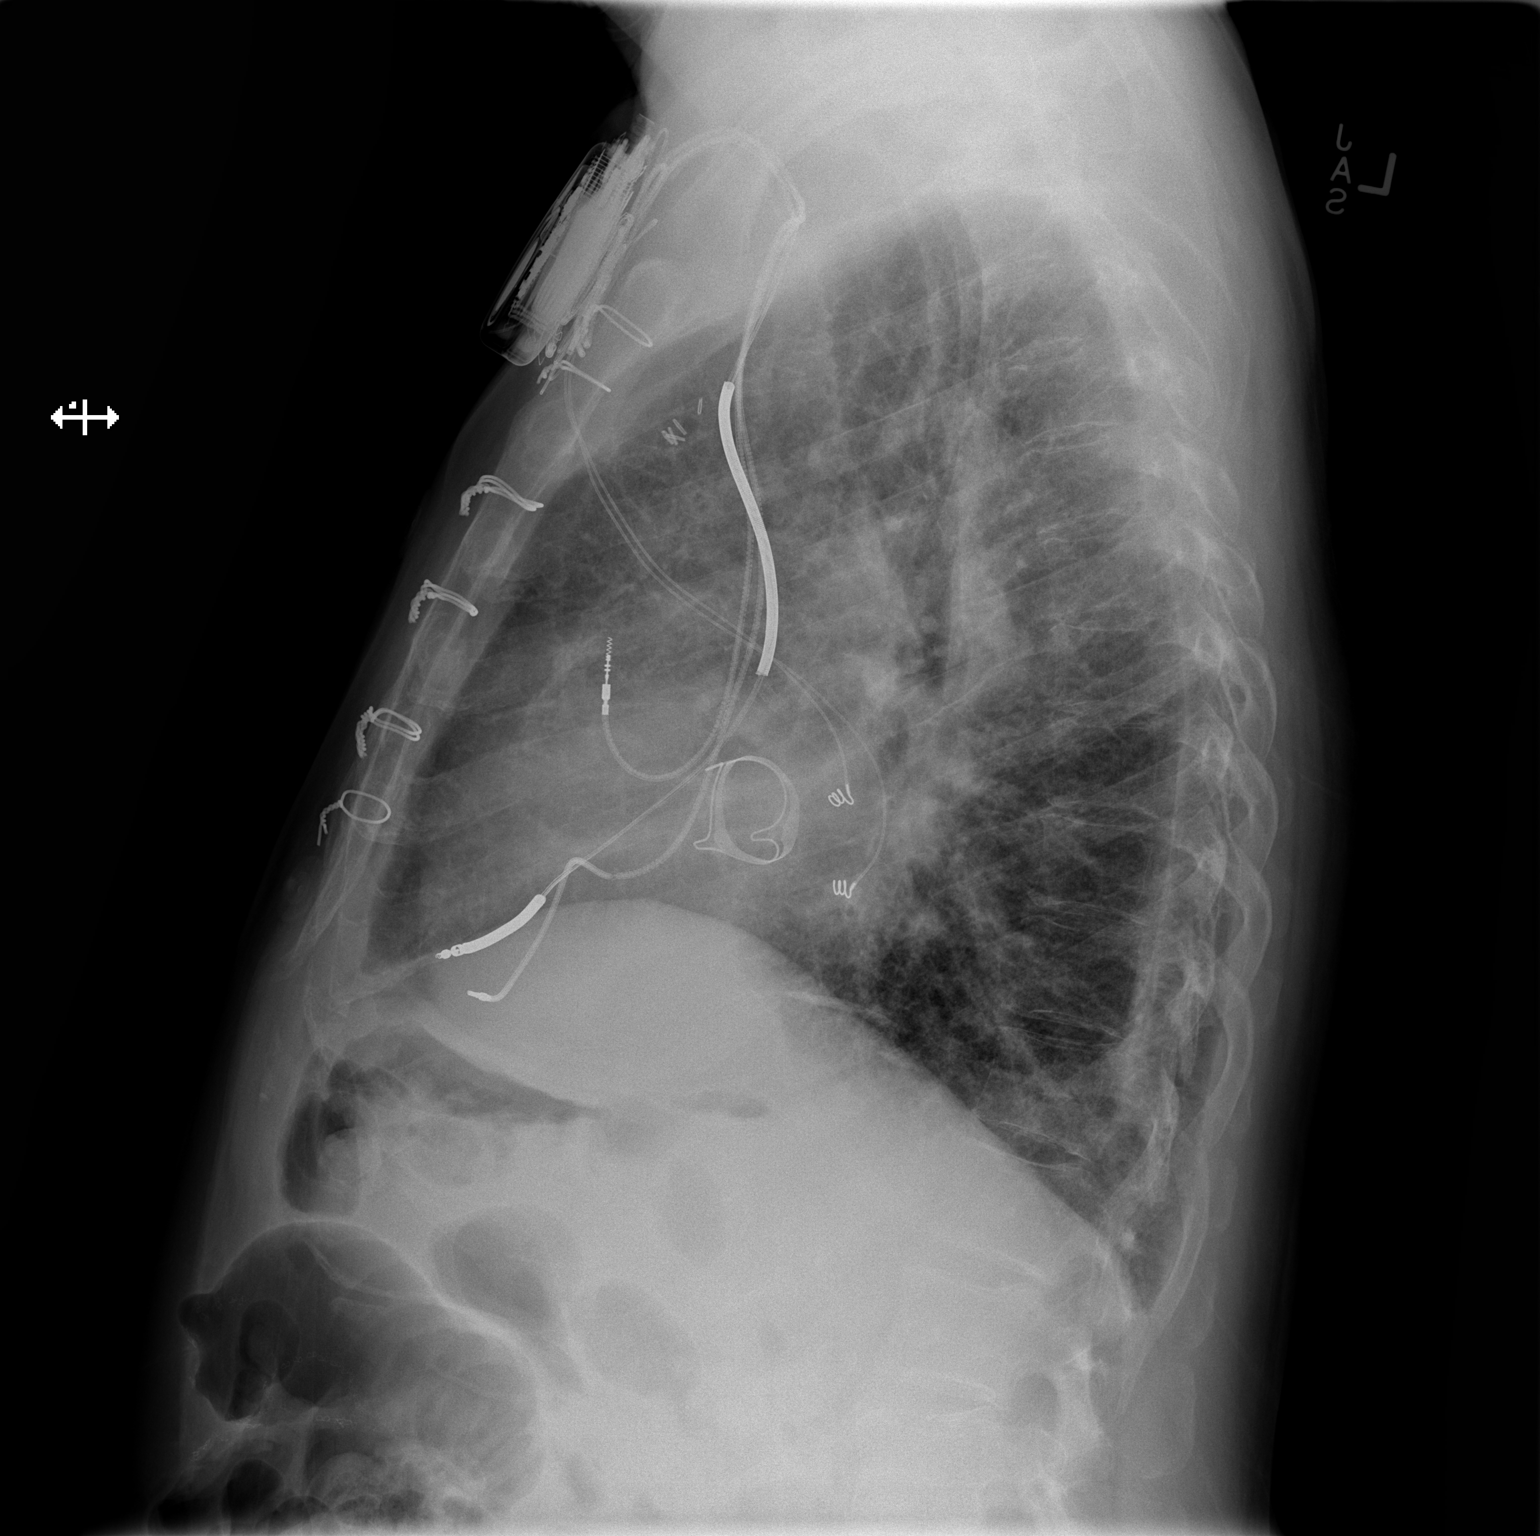

[2 of 2 positions shown; findings below may reference images not displayed]

FINDINGS: Stable appearance of the pacing / ICD device and mitral
valve prosthesis.  Lungs show a component of chronic disease and
potentially mild overlying interstitial edema/CHF.  No significant
pleural fluid is identified.
IMPRESSION: Potentially mild interstitial edema superimposed on chronic lung
disease.

## 2013-07-12 ENCOUNTER — Encounter: Payer: Self-pay | Admitting: Internal Medicine

## 2013-07-19 ENCOUNTER — Inpatient Hospital Stay (HOSPITAL_COMMUNITY): Admission: AD | Admit: 2013-07-19 | Payer: Medicare PPO | Source: Intra-hospital | Admitting: Psychiatry

## 2013-07-19 ENCOUNTER — Emergency Department (HOSPITAL_COMMUNITY)
Admission: EM | Admit: 2013-07-19 | Discharge: 2013-07-19 | Discharge status: Against Medical Advice | Payer: Medicare PPO | Attending: Emergency Medicine | Admitting: Emergency Medicine

## 2013-07-19 ENCOUNTER — Encounter (HOSPITAL_COMMUNITY): Payer: Self-pay | Admitting: Emergency Medicine

## 2013-07-19 ENCOUNTER — Emergency Department (HOSPITAL_COMMUNITY): Payer: Medicare PPO

## 2013-07-19 DIAGNOSIS — R4182 Altered mental status, unspecified: Secondary | ICD-10-CM | POA: Insufficient documentation

## 2013-07-19 DIAGNOSIS — Z79899 Other long term (current) drug therapy: Secondary | ICD-10-CM | POA: Insufficient documentation

## 2013-07-19 DIAGNOSIS — J449 Chronic obstructive pulmonary disease, unspecified: Secondary | ICD-10-CM | POA: Insufficient documentation

## 2013-07-19 DIAGNOSIS — Z86718 Personal history of other venous thrombosis and embolism: Secondary | ICD-10-CM | POA: Insufficient documentation

## 2013-07-19 DIAGNOSIS — F419 Anxiety disorder, unspecified: Secondary | ICD-10-CM

## 2013-07-19 DIAGNOSIS — I2510 Atherosclerotic heart disease of native coronary artery without angina pectoris: Secondary | ICD-10-CM | POA: Insufficient documentation

## 2013-07-19 DIAGNOSIS — I5022 Chronic systolic (congestive) heart failure: Secondary | ICD-10-CM | POA: Insufficient documentation

## 2013-07-19 DIAGNOSIS — Z950 Presence of cardiac pacemaker: Secondary | ICD-10-CM | POA: Insufficient documentation

## 2013-07-19 DIAGNOSIS — IMO0002 Reserved for concepts with insufficient information to code with codable children: Secondary | ICD-10-CM | POA: Insufficient documentation

## 2013-07-19 DIAGNOSIS — F29 Unspecified psychosis not due to a substance or known physiological condition: Secondary | ICD-10-CM | POA: Insufficient documentation

## 2013-07-19 DIAGNOSIS — F1010 Alcohol abuse, uncomplicated: Secondary | ICD-10-CM

## 2013-07-19 DIAGNOSIS — F411 Generalized anxiety disorder: Secondary | ICD-10-CM | POA: Insufficient documentation

## 2013-07-19 DIAGNOSIS — Z7982 Long term (current) use of aspirin: Secondary | ICD-10-CM | POA: Insufficient documentation

## 2013-07-19 DIAGNOSIS — E785 Hyperlipidemia, unspecified: Secondary | ICD-10-CM | POA: Insufficient documentation

## 2013-07-19 DIAGNOSIS — Z951 Presence of aortocoronary bypass graft: Secondary | ICD-10-CM | POA: Insufficient documentation

## 2013-07-19 DIAGNOSIS — Z8719 Personal history of other diseases of the digestive system: Secondary | ICD-10-CM | POA: Insufficient documentation

## 2013-07-19 DIAGNOSIS — Z87891 Personal history of nicotine dependence: Secondary | ICD-10-CM | POA: Insufficient documentation

## 2013-07-19 DIAGNOSIS — Z8673 Personal history of transient ischemic attack (TIA), and cerebral infarction without residual deficits: Secondary | ICD-10-CM | POA: Insufficient documentation

## 2013-07-19 DIAGNOSIS — J4489 Other specified chronic obstructive pulmonary disease: Secondary | ICD-10-CM | POA: Insufficient documentation

## 2013-07-19 DIAGNOSIS — I10 Essential (primary) hypertension: Secondary | ICD-10-CM | POA: Insufficient documentation

## 2013-07-19 LAB — COMPREHENSIVE METABOLIC PANEL
ALT: 21 U/L (ref 0–53)
AST: 26 U/L (ref 0–37)
Albumin: 3.7 g/dL (ref 3.5–5.2)
Alkaline Phosphatase: 130 U/L — ABNORMAL HIGH (ref 39–117)
BUN: 11 mg/dL (ref 6–23)
CALCIUM: 9.7 mg/dL (ref 8.4–10.5)
CO2: 28 meq/L (ref 19–32)
CREATININE: 0.84 mg/dL (ref 0.50–1.35)
Chloride: 97 mEq/L (ref 96–112)
GFR, EST NON AFRICAN AMERICAN: 89 mL/min — AB (ref >90)
GLUCOSE: 198 mg/dL — AB (ref 70–99)
Potassium: 4.1 mEq/L (ref 3.7–5.3)
SODIUM: 136 meq/L — AB (ref 137–147)
TOTAL PROTEIN: 8.6 g/dL — AB (ref 6.0–8.3)
Total Bilirubin: 0.4 mg/dL (ref 0.3–1.2)

## 2013-07-19 LAB — URINALYSIS, ROUTINE W REFLEX MICROSCOPIC
BILIRUBIN URINE: NEGATIVE
Glucose, UA: NEGATIVE mg/dL
Hgb urine dipstick: NEGATIVE
Ketones, ur: NEGATIVE mg/dL
LEUKOCYTES UA: NEGATIVE
NITRITE: NEGATIVE
PH: 6.5 (ref 5.0–8.0)
Protein, ur: NEGATIVE mg/dL
SPECIFIC GRAVITY, URINE: 1.015 (ref 1.005–1.030)
UROBILINOGEN UA: 0.2 mg/dL (ref 0.0–1.0)

## 2013-07-19 LAB — CBC
HCT: 45.2 % (ref 39.0–52.0)
HEMOGLOBIN: 14.9 g/dL (ref 13.0–17.0)
MCH: 30 pg (ref 26.0–34.0)
MCHC: 33 g/dL (ref 30.0–36.0)
MCV: 91.1 fL (ref 78.0–100.0)
Platelets: 175 10*3/uL (ref 150–400)
RBC: 4.96 MIL/uL (ref 4.22–5.81)
RDW: 14.3 % (ref 11.5–15.5)
WBC: 9.1 10*3/uL (ref 4.0–10.5)

## 2013-07-19 LAB — AMMONIA: Ammonia: 60 umol/L (ref 11–60)

## 2013-07-19 NOTE — Progress Notes (Addendum)
Review of notes states pt has left AMA.

## 2013-07-19 NOTE — ED Notes (Signed)
Pt refused any further treatment and left AMA.

## 2013-07-19 NOTE — [facility] Counselor (Signed)
This Clinical research associate contacted pt.'s nurse and Dr. Fayrene Fearing in re: TTS consult.  Dr. [id number]) is requesting medication recommendation for pt.'s increased agitation and aggressive behavior.  Pt is not SI/HI/AVH.  This Clinical research associate informed EDP, TTS would discuss disposition with Psych PA/NP for further eval.

## 2013-07-19 NOTE — ED Notes (Addendum)
Pt presents calm, cooperative, alert, and oriented x 4.  Sts "I guess I'm here because the doctor sent me."  Pt's girlfriend sts Pt has become increasingly anxious and confused x 1 months.  Pt sts "I get upset when people ask me for money."  Per PCP paperwork, they would like him evaluated for agitation and stroke.  Pt was only oriented to person and place at doctor's office.
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
## 2013-07-19 NOTE — ED Provider Notes (Signed)
CSN: [id number]     Arrival date & time 07/19/13  1406 History   First MD Initiated Contact with Patient 07/19/13 1840     Chief Complaint  Patient presents with  . Altered Mental Status  . Anxiety      HPI  Patient presents with his long-time girlfriend. Referred here after being seen by his primary care physician today. His troponin person ago when. He's had some significant changes in his behavior.  Changes have been  rather indolent over the last several months. No sudden changes.  History drinks a fair amount. He was admitted from anywhere from a beer a day to 6 beers a day. Somewhat evasive about this. Denies any withdrawal symptoms and that she does not drink. No falls or injuries or trauma. No numbness weakness of extremities or stroke symptoms. Denies any illicit drug use. He denies a history of bipolar disorder initially appears girlfriend so strong that he has had this diagnosis in the past. History of vasculopathy coronary artery disease systolic heart failure previous stroke to be from tobacco abuse pacemaker placement for heart block. Note breathing difficulty breathing chest pain shortness of breath. No fevers or chills. No change in medications. Girlfriend states that he will become rather threatening to her. Not physically harmful to her, however she is fearful. He denies any suicidal or homicidal thoughts and wishes. He does admit to becomes easily agitated and this has changed recently. He is rather insightful regarding this.     Past Medical History  Diagnosis Date  . DVT (deep venous thrombosis)   . 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 COPD (chronic obstructive pulmonary disease)   . Depression   . Hypertension   . Dyslipidemia   . Ventral hernia   . CAD (coronary artery disease)   . Hernia   . Chronic systolic congestive heart failure   . Bipolar disorder   . Anxiety   . Atrial fibrillation    persistent  . Alcohol abuse   . Complete heart block     s/p prior AV nodal ablation  . Stroke   . CVA (cerebral vascular accident)     multiple prior embolic strokes  . Pacemaker    Past Surgical History  Procedure Laterality Date  . Cystoscopy w/ ureteral stent placement  07/12/2011    Procedure: CYSTOSCOPY WITH RETROGRADE PYELOGRAM/URETERAL STENT PLACEMENT;  Surgeon: Sebastian Ache, MD;  Location: [facility] OR;  Service: Urology;  Laterality: Left;  . Coronary artery bypass graft    . Hernia repair    . Mitral valve replacement      remote [facility] valve [id number] with redo tissue valve 06/2006  . Pacemaker placement    . Av node ablation     Family History  Problem Relation Age of Onset  . Stroke    . Heart disease    . Alzheimer's disease Mother   . Heart attack Father    History  Substance Use Topics  . Smoking status: Former Smoker -- 2.00 packs/day    Types: Cigarettes    Quit date: 01/30/2003  . Smokeless tobacco: Never Used  . Alcohol Use: Yes     Comment: occasionally    Review of Systems  Constitutional: Negative for fever, chills, diaphoresis, appetite change and fatigue.  HENT: Negative for mouth sores, sore throat and trouble swallowing.   Eyes: Negative for visual disturbance.  Respiratory: Negative for cough,  chest tightness, shortness of breath and wheezing.   Cardiovascular: Negative for chest pain.  Gastrointestinal: Negative for nausea, vomiting, abdominal pain, diarrhea and abdominal distention.  Endocrine: Negative for polydipsia, polyphagia and polyuria.  Genitourinary: Negative for dysuria, frequency and hematuria.  Musculoskeletal: Negative for gait problem.  Skin: Negative for color change, pallor and rash.  Neurological: Negative for dizziness, syncope, light-headedness and headaches.  Hematological: Does not bruise/bleed easily.  Psychiatric/Behavioral: Positive for confusion and agitation. Negative for behavioral problems. The patient is  nervous/anxious.       Allergies  Review of patient's allergies indicates no known allergies.  Home Medications   Current Outpatient Rx  Name  Route  Sig  Dispense  Refill  . aspirin EC 81 MG tablet   Oral   Take 81 mg by mouth daily.         Marland Kitchen. atorvastatin (LIPITOR) 40 MG tablet   Oral   Take 40 mg by mouth daily.         . furosemide (LASIX) 40 MG tablet   Oral   Take 40 mg by mouth daily.         Marland Kitchen. lisinopril (PRINIVIL,ZESTRIL) 2.5 MG tablet   Oral   Take 2.5 mg by mouth daily.         . potassium chloride (K-DUR,KLOR-CON) 10 MEQ tablet   Oral   Take 20 mEq by mouth daily.          BP 130/77  Pulse 86  Temp(Src) 97.7 F (36.5 C) (Oral)  Resp 13  SpO2 93% Physical Exam  Constitutional: He is oriented to person, place, and time. He appears well-developed and well-nourished. No distress.  HENT:  Head: Normocephalic.  Eyes: Conjunctivae are normal. Pupils are equal, round, and reactive to light. No scleral icterus.  Neck: Normal range of motion. Neck supple. No thyromegaly present.  Cardiovascular: Normal rate and regular rhythm.  Exam reveals no gallop and no friction rub.   No murmur heard. Paced rhythm on the monitor  Pulmonary/Chest: Effort normal and breath sounds normal. No respiratory distress. He has no wheezes. He has no rales.  Abdominal: Soft. Bowel sounds are normal. He exhibits no distension. There is no tenderness. There is no rebound.  Musculoskeletal: Normal range of motion.  Neurological: He is alert and oriented to person, place, and time.  Skin: Skin is warm and dry. No rash noted.  Psychiatric: He has a normal mood and affect. His behavior is normal.  Calm here normal judgment and insight    ED Course  Procedures (including critical care time) Labs Review Labs Reviewed  COMPREHENSIVE METABOLIC PANEL - Abnormal; Notable for the following:    Sodium 136 (*)    Glucose, Bld 198 (*)    Total Protein 8.6 (*)    Alkaline  Phosphatase 130 (*)    GFR calc non Af Amer 89 (*)    All other components within normal limits  CBC  URINALYSIS, ROUTINE W REFLEX MICROSCOPIC  AMMONIA  TSH   Imaging Review Ct Head Wo Contrast  07/19/2013   CLINICAL DATA:  Headaches.  History of stroke.  EXAM: CT HEAD WITHOUT CONTRAST  TECHNIQUE: Contiguous axial images were obtained from the base of the skull through the vertex without intravenous contrast.  COMPARISON:  CT HEAD W/O CM dated 11/08/2012; CT C SPINE W/O CM dated 11/08/2012  FINDINGS: Multiple old infarcts are again noted with extensive encephalomalacia in the right frontal lobe and adjacent to the left sylvian fissure. There  is generalized atrophy and patchy periventricular white matter disease. No acute intracranial hemorrhage, mass lesion, brain edema or extra-axial fluid collection is identified. There is no evidence of acute stroke.  The visualized paranasal sinuses, mastoid air cells and middle ears are clear. The calvarium is intact.  IMPRESSION: Stable chronic findings with multiple old infarcts and generalized atrophy. No acute intracranial findings.   Electronically Signed   By: Roxy Horseman M.D.   On: 07/19/2013 19:28    EKG Interpretation    Date/Time:  Wednesday July 19 2013 18:24:05 EST Ventricular Rate:  88 PR Interval:  148 QRS Duration: 153 QT Interval:  387 QTC Calculation: 468 R Axis:   -88 Text Interpretation:  Atrial-ventricular dual-paced complexes No further analysis attempted due to paced rhythm Confirmed by Fayrene Fearing  MD, Jeylin Woodmansee ([id number]) on 07/19/2013 6:41:47 PM            MDM   Final diagnoses:  Alcohol abuse  Anxiety    Shows muscles moderate a prior infarct. No acute abnormalities noted. The abdomen is as labs are noted. Diagnosis include generalized anxiety disorder, multi-infarct dementia. Bipolar disorder. Seen and evaluated by psychiatry. He's been accepted behavioral health the care of Dr. Elsie Saas.    Rolland Porter, MD 07/19/13  2205

## 2013-07-20 LAB — TSH: TSH: 0.943 u[IU]/mL (ref 0.350–4.500)

## 2013-08-11 ENCOUNTER — Telehealth: Payer: Self-pay | Admitting: Cardiology

## 2013-08-11 NOTE — Telephone Encounter (Signed)
New message     Talk to Dr Anne Fu nurse regarding his medications

## 2013-08-14 ENCOUNTER — Other Ambulatory Visit: Payer: Self-pay | Admitting: Cardiology

## 2013-08-14 MED ORDER — FUROSEMIDE 40 MG PO TABS
40.0000 mg | ORAL_TABLET | Freq: Every day | ORAL | Status: DC
Start: 2013-08-14 — End: 2013-12-02

## 2013-08-14 MED ORDER — CARVEDILOL 6.25 MG PO TABS: 6.2500 mg | ORAL_TABLET | Freq: Two times a day (BID) | ORAL | Status: DC

## 2013-08-14 MED ORDER — LISINOPRIL 2.5 MG PO TABS: 2.5000 mg | ORAL_TABLET | Freq: Every day | ORAL | Status: DC

## 2013-08-14 MED ORDER — POTASSIUM CHLORIDE CRYS ER 10 MEQ PO TBCR: 20.0000 meq | EXTENDED_RELEASE_TABLET | Freq: Every day | ORAL | Status: DC

## 2013-08-14 MED ORDER — ASPIRIN EC 81 MG PO TBEC: 81.0000 mg | DELAYED_RELEASE_TABLET | Freq: Every day | ORAL | Status: DC

## 2013-08-14 MED ORDER — ATORVASTATIN CALCIUM 40 MG PO TABS: 40.0000 mg | ORAL_TABLET | Freq: Every day | ORAL | Status: DC

## 2013-08-14 NOTE — Telephone Encounter (Signed)
Patient Rx for all medication filled.

## 2013-08-14 NOTE — Telephone Encounter (Signed)
Spoke with patient, patient needed Rx filled. Sent all prescriptions to pharmacy.

## 2013-08-15 ENCOUNTER — Telehealth: Payer: Self-pay | Admitting: Cardiology

## 2013-08-15 NOTE — Telephone Encounter (Signed)
New problem   Pt need to know if he need to keep taking Carvedilol 6.25mg . Please call pt.
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
## 2013-08-17 IMAGING — CR DG CHEST 1V
1 series · 1 of 1 positions shown · non-contrast
Comparison: CT chest and chest radiograph 11/08/2012.

CLINICAL DATA: CHF decompensation CHF decompensation.

CHEST - 1 VIEW

[x chest ap]
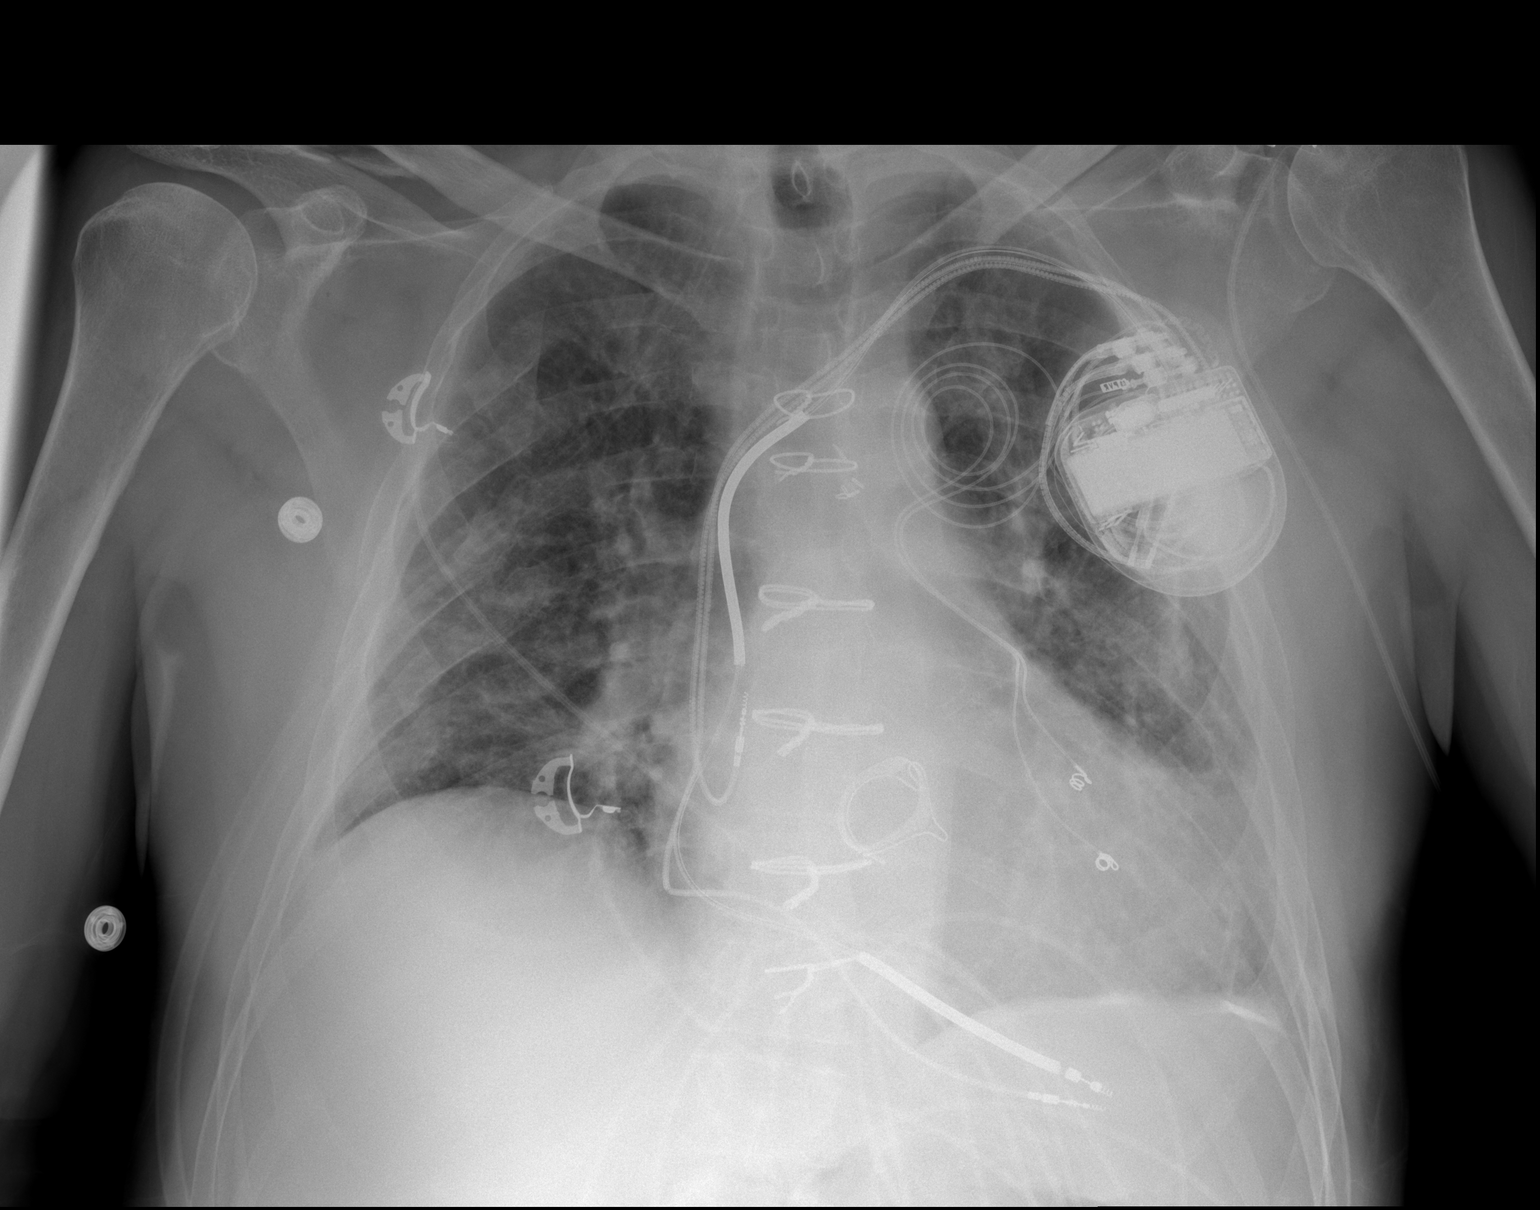

[1 of 1 positions shown; findings below may reference images not displayed]

FINDINGS: Trachea is midline.  Heart size stable.  Pacemaker ICD
lead tips are unchanged.  Lungs are somewhat low in volume with
slight interval improvement in diffuse bilateral air space disease.
Calcified pleural plaque along the left hemidiaphragm.  No definite
pleural fluid.

An acute right clavicle fracture again noted.  Old right rib
fractures.
IMPRESSION: 1.  Improving congestive heart failure.
2.  Acute distal right clavicle fracture.

## 2013-08-18 NOTE — Telephone Encounter (Signed)
lmom letting pt know to continue coreg unless Dr. Anne Fu or another doctor has told him to stop medication. Also, I told pt to call and make follow up asap!!

## 2013-09-12 ENCOUNTER — Encounter: Payer: Self-pay | Admitting: *Deleted

## 2013-11-20 ENCOUNTER — Encounter: Payer: Self-pay | Admitting: *Deleted

## 2013-12-02 ENCOUNTER — Encounter (HOSPITAL_COMMUNITY): Payer: Self-pay | Admitting: Emergency Medicine

## 2013-12-02 ENCOUNTER — Emergency Department (HOSPITAL_COMMUNITY)
Admission: EM | Admit: 2013-12-02 | Discharge: 2013-12-03 | Disposition: A | Discharge status: Home / Self Care | Payer: Medicare PPO | Attending: Emergency Medicine | Admitting: Emergency Medicine

## 2013-12-02 DIAGNOSIS — E785 Hyperlipidemia, unspecified: Secondary | ICD-10-CM | POA: Insufficient documentation

## 2013-12-02 DIAGNOSIS — Z79899 Other long term (current) drug therapy: Secondary | ICD-10-CM | POA: Insufficient documentation

## 2013-12-02 DIAGNOSIS — I5022 Chronic systolic (congestive) heart failure: Secondary | ICD-10-CM | POA: Insufficient documentation

## 2013-12-02 DIAGNOSIS — Z8673 Personal history of transient ischemic attack (TIA), and cerebral infarction without residual deficits: Secondary | ICD-10-CM | POA: Insufficient documentation

## 2013-12-02 DIAGNOSIS — Y929 Unspecified place or not applicable: Secondary | ICD-10-CM | POA: Insufficient documentation

## 2013-12-02 DIAGNOSIS — J449 Chronic obstructive pulmonary disease, unspecified: Secondary | ICD-10-CM | POA: Insufficient documentation

## 2013-12-02 DIAGNOSIS — W57XXXA Bitten or stung by nonvenomous insect and other nonvenomous arthropods, initial encounter: Secondary | ICD-10-CM | POA: Insufficient documentation

## 2013-12-02 DIAGNOSIS — I2510 Atherosclerotic heart disease of native coronary artery without angina pectoris: Secondary | ICD-10-CM | POA: Insufficient documentation

## 2013-12-02 DIAGNOSIS — T148 Other injury of unspecified body region: Secondary | ICD-10-CM | POA: Insufficient documentation

## 2013-12-02 DIAGNOSIS — Z91199 Patient's noncompliance with other medical treatment and regimen due to unspecified reason: Secondary | ICD-10-CM | POA: Insufficient documentation

## 2013-12-02 DIAGNOSIS — I10 Essential (primary) hypertension: Secondary | ICD-10-CM | POA: Insufficient documentation

## 2013-12-02 DIAGNOSIS — J4489 Other specified chronic obstructive pulmonary disease: Secondary | ICD-10-CM | POA: Insufficient documentation

## 2013-12-02 DIAGNOSIS — Z9114 Patient's other noncompliance with medication regimen: Secondary | ICD-10-CM

## 2013-12-02 DIAGNOSIS — Z951 Presence of aortocoronary bypass graft: Secondary | ICD-10-CM | POA: Insufficient documentation

## 2013-12-02 DIAGNOSIS — Z9119 Patient's noncompliance with other medical treatment and regimen: Secondary | ICD-10-CM | POA: Insufficient documentation

## 2013-12-02 DIAGNOSIS — F0390 Unspecified dementia without behavioral disturbance: Secondary | ICD-10-CM | POA: Insufficient documentation

## 2013-12-02 DIAGNOSIS — Z8719 Personal history of other diseases of the digestive system: Secondary | ICD-10-CM | POA: Insufficient documentation

## 2013-12-02 DIAGNOSIS — Z95810 Presence of automatic (implantable) cardiac defibrillator: Secondary | ICD-10-CM | POA: Insufficient documentation

## 2013-12-02 DIAGNOSIS — Y939 Activity, unspecified: Secondary | ICD-10-CM | POA: Insufficient documentation

## 2013-12-02 DIAGNOSIS — Z87891 Personal history of nicotine dependence: Secondary | ICD-10-CM | POA: Insufficient documentation

## 2013-12-02 DIAGNOSIS — Z7982 Long term (current) use of aspirin: Secondary | ICD-10-CM | POA: Insufficient documentation

## 2013-12-02 DIAGNOSIS — Z86718 Personal history of other venous thrombosis and embolism: Secondary | ICD-10-CM | POA: Insufficient documentation

## 2013-12-02 DIAGNOSIS — Z8659 Personal history of other mental and behavioral disorders: Secondary | ICD-10-CM | POA: Insufficient documentation

## 2013-12-02 LAB — CBC WITH DIFFERENTIAL/PLATELET
Basophils Absolute: 0.1 10*3/uL (ref 0.0–0.1)
Basophils Relative: 1 % (ref 0–1)
EOS ABS: 0.4 10*3/uL (ref 0.0–0.7)
EOS PCT: 4 % (ref 0–5)
HCT: 50 % (ref 39.0–52.0)
Hemoglobin: 16.1 g/dL (ref 13.0–17.0)
Lymphocytes Relative: 12 % (ref 12–46)
Lymphs Abs: 1.1 10*3/uL (ref 0.7–4.0)
MCH: 30.5 pg (ref 26.0–34.0)
MCHC: 32.2 g/dL (ref 30.0–36.0)
MCV: 94.7 fL (ref 78.0–100.0)
Monocytes Absolute: 0.8 10*3/uL (ref 0.1–1.0)
Monocytes Relative: 9 % (ref 3–12)
Neutro Abs: 6.9 10*3/uL (ref 1.7–7.7)
Neutrophils Relative %: 74 % (ref 43–77)
PLATELETS: 170 10*3/uL (ref 150–400)
RBC: 5.28 MIL/uL (ref 4.22–5.81)
RDW: 14 % (ref 11.5–15.5)
WBC: 9.3 10*3/uL (ref 4.0–10.5)

## 2013-12-02 LAB — I-STAT CG4 LACTIC ACID, ED: Lactic Acid, Venous: 0.6 mmol/L (ref 0.5–2.2)

## 2013-12-02 LAB — COMPREHENSIVE METABOLIC PANEL
ALBUMIN: 4.1 g/dL (ref 3.5–5.2)
ALK PHOS: 125 U/L — AB (ref 39–117)
ALT: 21 U/L (ref 0–53)
AST: 29 U/L (ref 0–37)
BILIRUBIN TOTAL: 0.7 mg/dL (ref 0.3–1.2)
BUN: 17 mg/dL (ref 6–23)
CHLORIDE: 98 meq/L (ref 96–112)
CO2: 26 meq/L (ref 19–32)
CREATININE: 0.98 mg/dL (ref 0.50–1.35)
Calcium: 9.6 mg/dL (ref 8.4–10.5)
GFR calc Af Amer: >90 mL/min (ref >90)
GFR, EST NON AFRICAN AMERICAN: 84 mL/min — AB (ref >90)
Glucose, Bld: 103 mg/dL — ABNORMAL HIGH (ref 70–99)
POTASSIUM: 4.5 meq/L (ref 3.7–5.3)
Sodium: 140 mEq/L (ref 137–147)
Total Protein: 8.4 g/dL — ABNORMAL HIGH (ref 6.0–8.3)

## 2013-12-02 NOTE — ED Notes (Signed)
Patient here with Ex wife, patient has not been taking his meds for last 6 months.  Patient also has some dementia per wife.  Patient usually gets worse later in the day.

## 2013-12-02 NOTE — ED Provider Notes (Signed)
CSN: [id number]     Arrival date & time 12/02/13  1938 History   First MD Initiated Contact with Patient 12/02/13 2259     Chief Complaint  Patient presents with  . Weakness     (Consider location/radiation/quality/duration/timing/severity/associated sxs/prior Treatment) Patient is a 68 y.o. male presenting with weakness. The history is provided by a friend. The history is limited by the condition of the patient (Dementia).  Weakness  He apparently has not been taking his medications for at least 3 or 4 months and his friend states that his legs were are swollen. She convinced him to take a dose of his fluid pill yesterday. Today, he had an episode where his left foot turned blue and his lips were blue and he was complaining of pain around his left knee. The symptoms have resolved. They're also concerned because he is getting gout multiple bed bug bites. He's not having any chest pain, dyspnea, nausea. He had does drink FML but has not had any today. Both he and his friend are very evasive about the amount that he does drink.  Past Medical History  Diagnosis Date  . DVT (deep venous thrombosis)   . 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 COPD (chronic obstructive pulmonary disease)   . Depression   . Hypertension   . Dyslipidemia   . Ventral hernia   . CAD (coronary artery disease)   . Hernia   . Chronic systolic congestive heart failure   . Bipolar disorder   . Anxiety   . Atrial fibrillation     persistent  . Alcohol abuse   . Complete heart block     s/p prior AV nodal ablation  . Stroke   . CVA (cerebral vascular accident)     multiple prior embolic strokes  . Pacemaker    Past Surgical History  Procedure Laterality Date  . Cystoscopy w/ ureteral stent placement  07/12/2011    Procedure: CYSTOSCOPY WITH RETROGRADE PYELOGRAM/URETERAL STENT PLACEMENT;  Surgeon: Sebastian Ache, MD;  Location: [facility] OR;  Service:  Urology;  Laterality: Left;  . Coronary artery bypass graft    . Hernia repair    . Mitral valve replacement      remote [facility] valve 7169 with redo tissue valve 06/2006  . Pacemaker placement    . Av node ablation     Family History  Problem Relation Age of Onset  . Stroke    . Heart disease    . Alzheimer's disease Mother   . Heart attack Father    History  Substance Use Topics  . Smoking status: Former Smoker -- 2.00 packs/day    Types: Cigarettes    Quit date: 01/30/2003  . Smokeless tobacco: Never Used  . Alcohol Use: Yes     Comment: occasionally    Review of Systems  Unable to perform ROS: Dementia  Neurological: Positive for weakness.      Allergies  Review of patient's allergies indicates no known allergies.  Home Medications   Prior to Admission medications   Medication Sig Start Date End Date Taking? Authorizing Provider  albuterol-ipratropium (COMBIVENT) 18-103 MCG/ACT inhaler Inhale 2 puffs into the lungs every 4 (four) hours.   Yes Historical Provider, MD  aspirin EC 81 MG tablet Take 1 tablet (81 mg total) by mouth daily. 08/14/13  Yes Donato Schultz, MD  atorvastatin (LIPITOR) 40 MG tablet Take 40 mg by mouth  once.   Yes Historical Provider, MD  carvedilol (COREG) 6.25 MG tablet Take 6.25 mg by mouth 2 (two) times daily with a meal.   Yes Historical Provider, MD  furosemide (LASIX) 40 MG tablet Take 40 mg by mouth once.   Yes Historical Provider, MD  potassium chloride (MICRO-K) 10 MEQ CR capsule Take 10 mEq by mouth once.   Yes Historical Provider, MD   BP 131/86  Pulse 75  Temp(Src) 97.9 F (36.6 C) (Oral)  Resp 18  Ht 5\' 7"  (1.702 m)  Wt 136 lb (61.689 kg)  BMI 21.30 kg/m2  SpO2 96% Physical Exam  Nursing note and vitals reviewed.  68 year old male, resting comfortably and in no acute distress. Vital signs are normal. Oxygen saturation is 96%, which is normal. Head is normocephalic and atraumatic. PERRLA, EOMI. Oropharynx is clear. Neck  is nontender and supple without adenopathy or JVD. Back is nontender and there is no CVA tenderness. Lungs are clear without rales, wheezes, or rhonchi. Chest is nontender. Heart has regular rate and rhythm without murmur. Abdomen is soft, flat, nontender without masses or hepatosplenomegaly and peristalsis is normoactive. Extremities have no cyanosis or edema, full range of motion is present. Feet are cool to touch and dorsalis pedis pulses are not palpable but capillary refill is less than 2 seconds. Skin is warm and dry. He has multiple arthropod bites on his legs but no other rashes seen Neurologic: Mental status is normal, cranial nerves are intact, there are no motor or sensory deficits.  ED Course  Procedures (including critical care time) Labs Review Results for orders placed during the hospital encounter of 12/02/13  CBC WITH DIFFERENTIAL      Result Value Ref Range   WBC 9.3  4.0 - 10.5 K/uL   RBC 5.28  4.22 - 5.81 MIL/uL   Hemoglobin 16.1  13.0 - 17.0 g/dL   HCT 16.1  09.6 - 04.5 %   MCV 94.7  78.0 - 100.0 fL   MCH 30.5  26.0 - 34.0 pg   MCHC 32.2  30.0 - 36.0 g/dL   RDW 40.9  81.1 - 91.4 %   Platelets 170  150 - 400 K/uL   Neutrophils Relative % 74  43 - 77 %   Neutro Abs 6.9  1.7 - 7.7 K/uL   Lymphocytes Relative 12  12 - 46 %   Lymphs Abs 1.1  0.7 - 4.0 K/uL   Monocytes Relative 9  3 - 12 %   Monocytes Absolute 0.8  0.1 - 1.0 K/uL   Eosinophils Relative 4  0 - 5 %   Eosinophils Absolute 0.4  0.0 - 0.7 K/uL   Basophils Relative 1  0 - 1 %   Basophils Absolute 0.1  0.0 - 0.1 K/uL  COMPREHENSIVE METABOLIC PANEL      Result Value Ref Range   Sodium 140  137 - 147 mEq/L   Potassium 4.5  3.7 - 5.3 mEq/L   Chloride 98  96 - 112 mEq/L   CO2 26  19 - 32 mEq/L   Glucose, Bld 103 (*) 70 - 99 mg/dL   BUN 17  6 - 23 mg/dL   Creatinine, Ser 7.82  0.50 - 1.35 mg/dL   Calcium 9.6  8.4 - 95.6 mg/dL   Total Protein 8.4 (*) 6.0 - 8.3 g/dL   Albumin 4.1  3.5 - 5.2 g/dL   AST  29  0 - 37 U/L   ALT 21  0 - 53 U/L   Alkaline Phosphatase 125 (*) 39 - 117 U/L   Total Bilirubin 0.7  0.3 - 1.2 mg/dL   GFR calc non Af Amer 84 (*) >90 mL/min   GFR calc Af Amer >90  >90 mL/min  URINALYSIS, ROUTINE W REFLEX MICROSCOPIC      Result Value Ref Range   Color, Urine YELLOW  YELLOW   APPearance CLEAR  CLEAR   Specific Gravity, Urine 1.012  1.005 - 1.030   pH 6.5  5.0 - 8.0   Glucose, UA NEGATIVE  NEGATIVE mg/dL   Hgb urine dipstick NEGATIVE  NEGATIVE   Bilirubin Urine NEGATIVE  NEGATIVE   Ketones, ur NEGATIVE  NEGATIVE mg/dL   Protein, ur NEGATIVE  NEGATIVE mg/dL   Urobilinogen, UA 1.0  0.0 - 1.0 mg/dL   Nitrite NEGATIVE  NEGATIVE   Leukocytes, UA NEGATIVE  NEGATIVE  I-STAT CG4 LACTIC ACID, ED      Result Value Ref Range   Lactic Acid, Venous 0.60  0.5 - 2.2 mmol/L   MDM   Final diagnoses:  Bedbug bite  Noncompliance with medication regimen    Transient episode of leg turning blue. Medication noncompliance. Multiple bed bug bites. CBC and comprehensive metabolic panel are unremarkable. Although he definitely has evidence of poor circulation in his legs, I do not see any evidence of any acute process currently. Lactic acid level be checked. Urinalysis will also be checked. Old records are reviewed and he had a similar presentation in February at which time [facility].  Workup is unremarkable. He is ambulated without difficulty. He is discharged with instructions to have a professional and terminated or take care of the bed bug infestation and make sure that he takes all prescribed medications as prescribed. He is to make a followup appointment with his PCP.  Dione Boozeavid Hanifa Antonetti, MD 12/03/13 [phone]

## 2013-12-02 NOTE — ED Notes (Signed)
Lt lower leg pain  also

## 2013-12-02 NOTE — ED Notes (Signed)
The pt stopped taking all his meds 6 months ago,  ams  Weakness he has poor  Peripheral circulation.  He has multiple  Bed bugs bites over his body.

## 2013-12-03 LAB — URINALYSIS, ROUTINE W REFLEX MICROSCOPIC
BILIRUBIN URINE: NEGATIVE
GLUCOSE, UA: NEGATIVE mg/dL
Hgb urine dipstick: NEGATIVE
KETONES UR: NEGATIVE mg/dL
Leukocytes, UA: NEGATIVE
Nitrite: NEGATIVE
PH: 6.5 (ref 5.0–8.0)
PROTEIN: NEGATIVE mg/dL
Specific Gravity, Urine: 1.012 (ref 1.005–1.030)
Urobilinogen, UA: 1 mg/dL (ref 0.0–1.0)

## 2013-12-03 MED ORDER — TRAMADOL HCL 50 MG PO TABS
50.0000 mg | ORAL_TABLET | Freq: Once | ORAL | Status: AC
  Administered 2013-12-03: 50 mg via ORAL
  Filled 2013-12-03: qty 1

## 2013-12-03 NOTE — ED Notes (Signed)
Ambulated patient per RN request and pt did very well.  Had no issues with gait at all, very steady

## 2013-12-03 NOTE — Discharge Instructions (Signed)
Take Benadryl as needed for itching. Apply hydrocortisone cream twice a day.  Make sure to have an exterminator get rid of the bedbugs.  Make sure to take your medications as prescribed.  Bedbugs Bedbugs are tiny bugs that live in and around beds. During the day, they hide in mattresses and other places near beds. They come out at night and bite people lying in bed. They need blood to live and grow. Bedbugs can be found in beds anywhere. Usually, they are found in places where many people come and go (hotels, shelters, hospitals). It does not matter whether the place is dirty or clean. Getting bitten by bedbugs rarely causes a medical problem. The biggest problem can be getting rid of them. This often takes the work of a Oncologist. CAUSES  Less use of pesticides. Bedbugs were common before the 1950s. Then, strong pesticides such as DDT nearly wiped them out. Today, these pesticides are not used because they harm the environment and can cause health problems.  More travel. Besides mattresses, bedbugs can also live in clothing and luggage. They can come along as people travel from place to place. Bedbugs are more common in certain parts of the world. When people travel to those areas, the bugs can come home with them.  Presence of birds and bats. Bedbugs often infest birds and bats. If you have these animals in or near your home, bedbugs may infest your house, too. SYMPTOMS It does not hurt to be bitten by a bedbug. You will probably not wake up when you are bitten. Bedbugs usually bite areas of the skin that are not covered. Symptoms may show when you wake up, or they may take a day or more to show up. Symptoms may include:  Small red bumps on the skin. These might be lined up in a row or clustered in a group.  A darker red dot in the middle of red bumps.  Blisters on the skin. There may be swelling and very bad itching. These may be signs of an allergic reaction. This does not  happen often. DIAGNOSIS Bedbug bites might look and feel like other types of insect bites. The bugs do not stay on the body like ticks or lice. They bite, drop off, and crawl away to hide. Your caregiver will probably:  Ask about your symptoms.  Ask about your recent activities and travel.  Check your skin for bedbug bites.  Ask you to check at home for signs of bedbugs. You should look for:  Spots or stains on the bed or nearby. This could be from bedbugs that were crushed or from their eggs or waste.  Bedbugs themselves. They are reddish-brown, oval, and flat. They do not fly. They are about the size of an apple seed.  Places to look for bedbugs include:  Beds. Check mattresses, headboards, box springs, and bed frames.  On drapes and curtains near the bed.  Under carpeting in the bedroom.  Behind electrical outlets.  Behind any wallpaper that is peeling.  Inside luggage. TREATMENT Most bedbug bites do not need treatment. They usually go away on their own in a few days. The bites are not dangerous. However, treatment may be needed if you have scratched so much that your skin has become infected. You may also need treatment if you are allergic to bedbug bites. Treatment options include:  A drug that stops swelling and itching (corticosteroid). Usually, a cream is rubbed on the skin. If you have a  bad rash, you may be given a corticosteroid pill.  Oral antihistamines. These are pills to help control itching.  Antibiotic medicines. An antibiotic may be prescribed for infected skin. HOME CARE INSTRUCTIONS   Take any medicine prescribed by your caregiver for your bites. Follow the directions carefully.  Consider wearing pajamas with long sleeves and pant legs.  Your bedroom may need to be treated. A pest control expert should make sure the bedbugs are gone. You may need to throw away mattresses or luggage. Ask the pest control expert what you can do to keep the bedbugs from  coming back. Common suggestions include:  Putting a plastic cover over your mattress.  Washing and drying your clothes and bedding in hot water and a hot dryer. The temperature should be hotter than 120 F (48.9 C). Bedbugs are killed by high temperatures.  Vacuuming carefully all around your bed. Vacuum in all cracks and crevices where the bugs might hide. Do this often.  Carefully checking all used furniture, bedding, or clothes that you bring into your house.  Eliminating bird nests and bat roosts.  If you get bedbug bites when traveling, check all your possessions carefully before bringing them into your house. If you find any bugs on clothes or in your luggage, consider throwing those items away. SEEK MEDICAL CARE IF:  You have red bug bites that keep coming back.  You have red bug bites that itch badly.  You have bug bites that cause a skin rash.  You have scratch marks that are red and sore. SEEK IMMEDIATE MEDICAL CARE IF: You have a fever. Document Released: 06/27/2010 Document Revised: 08/17/2011 Document Reviewed: 06/27/2010 [facility] Patient Information 2015 [place] [place]. This information is not intended to replace advice given to you by your health care provider. Make sure you discuss any questions you have with your health care provider.

## 2013-12-27 ENCOUNTER — Encounter: Payer: Self-pay | Admitting: *Deleted

## 2014-01-26 ENCOUNTER — Other Ambulatory Visit: Payer: Self-pay | Admitting: *Deleted

## 2014-01-26 DIAGNOSIS — R0989 Other specified symptoms and signs involving the circulatory and respiratory systems: Secondary | ICD-10-CM

## 2014-01-31 ENCOUNTER — Encounter: Payer: Self-pay | Admitting: Internal Medicine

## 2014-01-31 ENCOUNTER — Telehealth: Payer: Self-pay | Admitting: Internal Medicine

## 2014-01-31 ENCOUNTER — Ambulatory Visit (HOSPITAL_BASED_OUTPATIENT_CLINIC_OR_DEPARTMENT_OTHER): Payer: Medicare PPO | Admitting: Radiology

## 2014-01-31 ENCOUNTER — Encounter: Payer: Self-pay | Admitting: *Deleted

## 2014-01-31 ENCOUNTER — Ambulatory Visit (INDEPENDENT_AMBULATORY_CARE_PROVIDER_SITE_OTHER): Payer: Medicare PPO | Admitting: Internal Medicine

## 2014-01-31 ENCOUNTER — Encounter: Payer: Commercial Managed Care - HMO | Admitting: *Deleted

## 2014-01-31 VITALS — BP 137/82 | HR 76 | Ht 67.0 in | Wt 136.4 lb

## 2014-01-31 DIAGNOSIS — I482 Chronic atrial fibrillation, unspecified: Secondary | ICD-10-CM

## 2014-01-31 DIAGNOSIS — I442 Atrioventricular block, complete: Secondary | ICD-10-CM | POA: Diagnosis not present

## 2014-01-31 DIAGNOSIS — I5022 Chronic systolic (congestive) heart failure: Secondary | ICD-10-CM

## 2014-01-31 DIAGNOSIS — Z952 Presence of prosthetic heart valve: Secondary | ICD-10-CM | POA: Diagnosis not present

## 2014-01-31 DIAGNOSIS — Z95810 Presence of automatic (implantable) cardiac defibrillator: Secondary | ICD-10-CM

## 2014-01-31 DIAGNOSIS — Z9119 Patient's noncompliance with other medical treatment and regimen: Secondary | ICD-10-CM | POA: Diagnosis not present

## 2014-01-31 DIAGNOSIS — Z4502 Encounter for adjustment and management of automatic implantable cardiac defibrillator: Secondary | ICD-10-CM | POA: Diagnosis not present

## 2014-01-31 DIAGNOSIS — F1010 Alcohol abuse, uncomplicated: Secondary | ICD-10-CM | POA: Diagnosis not present

## 2014-01-31 DIAGNOSIS — Z01812 Encounter for preprocedural laboratory examination: Secondary | ICD-10-CM

## 2014-01-31 DIAGNOSIS — E785 Hyperlipidemia, unspecified: Secondary | ICD-10-CM | POA: Diagnosis not present

## 2014-01-31 DIAGNOSIS — I4891 Unspecified atrial fibrillation: Secondary | ICD-10-CM

## 2014-01-31 DIAGNOSIS — F319 Bipolar disorder, unspecified: Secondary | ICD-10-CM | POA: Diagnosis not present

## 2014-01-31 DIAGNOSIS — I252 Old myocardial infarction: Secondary | ICD-10-CM | POA: Diagnosis not present

## 2014-01-31 DIAGNOSIS — I509 Heart failure, unspecified: Secondary | ICD-10-CM | POA: Diagnosis not present

## 2014-01-31 DIAGNOSIS — I2510 Atherosclerotic heart disease of native coronary artery without angina pectoris: Secondary | ICD-10-CM | POA: Diagnosis not present

## 2014-01-31 DIAGNOSIS — Z951 Presence of aortocoronary bypass graft: Secondary | ICD-10-CM | POA: Diagnosis not present

## 2014-01-31 DIAGNOSIS — I10 Essential (primary) hypertension: Secondary | ICD-10-CM | POA: Diagnosis not present

## 2014-01-31 DIAGNOSIS — I2589 Other forms of chronic ischemic heart disease: Secondary | ICD-10-CM | POA: Diagnosis not present

## 2014-01-31 DIAGNOSIS — Z91199 Patient's noncompliance with other medical treatment and regimen due to unspecified reason: Secondary | ICD-10-CM | POA: Diagnosis not present

## 2014-01-31 DIAGNOSIS — Z7982 Long term (current) use of aspirin: Secondary | ICD-10-CM | POA: Diagnosis not present

## 2014-01-31 DIAGNOSIS — Z8673 Personal history of transient ischemic attack (TIA), and cerebral infarction without residual deficits: Secondary | ICD-10-CM | POA: Diagnosis not present

## 2014-01-31 DIAGNOSIS — Z86718 Personal history of other venous thrombosis and embolism: Secondary | ICD-10-CM | POA: Diagnosis not present

## 2014-01-31 DIAGNOSIS — J449 Chronic obstructive pulmonary disease, unspecified: Secondary | ICD-10-CM | POA: Diagnosis not present

## 2014-01-31 DIAGNOSIS — R0989 Other specified symptoms and signs involving the circulatory and respiratory systems: Secondary | ICD-10-CM

## 2014-01-31 LAB — CBC WITH DIFFERENTIAL/PLATELET
BASOS PCT: 0.6 % (ref 0.0–3.0)
Basophils Absolute: 0.1 10*3/uL (ref 0.0–0.1)
Eosinophils Absolute: 0.4 10*3/uL (ref 0.0–0.7)
Eosinophils Relative: 4.8 % (ref 0.0–5.0)
HEMATOCRIT: 50.4 % (ref 39.0–52.0)
HEMOGLOBIN: 16.5 g/dL (ref 13.0–17.0)
Lymphocytes Relative: 16.1 % (ref 12.0–46.0)
Lymphs Abs: 1.5 10*3/uL (ref 0.7–4.0)
MCHC: 32.8 g/dL (ref 30.0–36.0)
MCV: 90.3 fl (ref 78.0–100.0)
MONOS PCT: 12 % (ref 3.0–12.0)
Monocytes Absolute: 1.1 10*3/uL — ABNORMAL HIGH (ref 0.1–1.0)
NEUTROS ABS: 6.2 10*3/uL (ref 1.4–7.7)
Neutrophils Relative %: 66.5 % (ref 43.0–77.0)
Platelets: 181 10*3/uL (ref 150.0–400.0)
RBC: 5.58 Mil/uL (ref 4.22–5.81)
RDW: 14.5 % (ref 11.5–15.5)
WBC: 9.3 10*3/uL (ref 4.0–10.5)

## 2014-01-31 LAB — MDC_IDC_ENUM_SESS_TYPE_INCLINIC
Battery Voltage: 2.61 V
Brady Statistic AP VP Percent: 14.47 %
Brady Statistic AS VP Percent: 82.47 %
Brady Statistic AS VS Percent: 3.03 %
Brady Statistic RA Percent Paced: 14.5 %
Brady Statistic RV Percent Paced: 96.94 %
Date Time Interrogation Session: 20150826111646
HIGH POWER IMPEDANCE MEASURED VALUE: 49 Ohm
HIGH POWER IMPEDANCE MEASURED VALUE: 62 Ohm
Lead Channel Impedance Value: 568 Ohm
Lead Channel Impedance Value: 656 Ohm
Lead Channel Setting Pacing Amplitude: 2 V
Lead Channel Setting Pacing Amplitude: 2.5 V
Lead Channel Setting Pacing Amplitude: 2.5 V
Lead Channel Setting Pacing Pulse Width: 0.4 ms
Lead Channel Setting Sensing Sensitivity: 0.45 mV
MDC IDC SET LEADCHNL RV PACING PULSEWIDTH: 0.4 ms
MDC IDC SET ZONE DETECTION INTERVAL: 350 ms
MDC IDC SET ZONE DETECTION INTERVAL: 450 ms
MDC IDC STAT BRADY AP VS PERCENT: 0.02 %
Zone Setting Detection Interval: 270 ms
Zone Setting Detection Interval: 300 ms
Zone Setting Detection Interval: 400 ms

## 2014-01-31 LAB — BASIC METABOLIC PANEL
BUN: 12 mg/dL (ref 6–23)
CALCIUM: 9.7 mg/dL (ref 8.4–10.5)
CO2: 29 meq/L (ref 19–32)
Chloride: 100 mEq/L (ref 96–112)
Creatinine, Ser: 1.1 mg/dL (ref 0.4–1.5)
GFR: 73.35 mL/min (ref >60.00)
Glucose, Bld: 90 mg/dL (ref 70–99)
POTASSIUM: 4 meq/L (ref 3.5–5.1)
SODIUM: 137 meq/L (ref 135–145)

## 2014-01-31 NOTE — Patient Instructions (Signed)
Your physician recommends that you continue on your current medications as directed. Please refer to the Current Medication list given to you today.  Your physician has recommended that you have a defibrillator generator change. Please see the instruction sheet given to you today for more information.  Your wound check is scheduled for 02/14/14 at 10:30 am at [address]

## 2014-01-31 NOTE — Telephone Encounter (Signed)
Spoke with pt's dtr (ok per pt), reviewed current pt status and plan of care/action. Reviewed procedure instructions with her. [name] verbalized understanding and will be bringing dad to the hospital in the morning.

## 2014-01-31 NOTE — Progress Notes (Signed)
Limited Echocardiogram performed for LV Function.  

## 2014-01-31 NOTE — Progress Notes (Signed)
Patient Care Team: Cain Saupe, MD as PCP - General (Family Medicine)   HPI  [name] is a 68 y.o. male  seen as an add-on today because of his device having reached EOS. He is status post CRT-D implantation originally undertaken by Dr. Ty Hilts. Initial procedure was in 2006; AV junction ablation in 2007   with an upgrade to a CRT-D in 2009. This is for primary prevention.   He ended up requiring surgical  the deployment of an LV lead.    He has been poorly compliant with cardiology and with EP  He is from alcohol abuse and not withstanding his mitral valve replacement with a bioprosthesis replacing a thrombosed Mallie Mussel he is not on anticoagulation.   he continues to drink although he says just on weekends.   He does not have peripheral edema; he denies chest pain.  Ejection fraction 5/14 was 45% by echo    Past Medical History  Diagnosis Date  . DVT (deep venous thrombosis)   . 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 COPD (chronic obstructive pulmonary disease)   . Depression   . Hypertension   . Dyslipidemia   . Ventral hernia   . CAD (coronary artery disease)   . Hernia   . Chronic systolic congestive heart failure   . Bipolar disorder   . Anxiety   . Atrial fibrillation     persistent  . Alcohol abuse   . Complete heart block     s/p prior AV nodal ablation  . Stroke   . CVA (cerebral vascular accident)     multiple prior embolic strokes  . Pacemaker     Past Surgical History  Procedure Laterality Date  . Cystoscopy w/ ureteral stent placement  07/12/2011    Procedure: CYSTOSCOPY WITH RETROGRADE PYELOGRAM/URETERAL STENT PLACEMENT;  Surgeon: Sebastian Ache, MD;  Location: [facility] OR;  Service: Urology;  Laterality: Left;  . Coronary artery bypass graft    . Hernia repair    . Mitral valve replacement      remote [facility] valve 5916 with redo tissue valve 06/2006  . Pacemaker  placement    . Av node ablation      Current Outpatient Prescriptions  Medication Sig Dispense Refill  . albuterol-ipratropium (COMBIVENT) 18-103 MCG/ACT inhaler Inhale 2 puffs into the lungs every 4 (four) hours.      Marland Kitchen aspirin EC 81 MG tablet Take 1 tablet (81 mg total) by mouth daily.  30 tablet  3  . atorvastatin (LIPITOR) 40 MG tablet Take 40 mg by mouth once.      . carvedilol (COREG) 6.25 MG tablet Take 6.25 mg by mouth 2 (two) times daily with a meal.      . furosemide (LASIX) 40 MG tablet Take 40 mg by mouth once.      . potassium chloride (MICRO-K) 10 MEQ CR capsule Take 10 mEq by mouth once.       No current facility-administered medications for this visit.    No Known Allergies  Review of Systems negative except from HPI and PMH  Physical Exam BP 137/82  Pulse 76  Ht 5\' 7"  (1.702 m)  Wt 136 lb 6 oz (61.859 kg)  BMI 21.35 kg/m2 Well developed and well nourished in no acute distress HENT normal E scleral and icterus clear Neck Supple JVP flat; carotids brisk and full Clear to  ausculation Device pocket well healed; without hematoma or erythema.  There is no tethering   Regular rate and rhythm, no murmurs gallops or rub Soft with active bowel sounds No clubbing cyanosis  Edema Alert and oriented x2. He thinks it April 2015. He is unaware of the present.  grossly normal motor and sensory function Skin Warm and Dry    Assessment and  plan   Previously implanted CRT-D now VOS  Nonischemic cardiomyopathy with interval improvement most recently 40-45%  Complete heart block-device dependent status post AV junction ablation  Atrial fibrillation-permanent  Noncompliance precluding anticoagulation  Patient presents with a device at EOS which is device dependent as his unpredictable*longevity. Hence we'll undertake urgent device generator replacement.  Left ventricular function is impaired at 30% or less. We'll replace his device was CRT-D. Will anticipate use of  a Medtronic device as it is smaller in a submuscular pocket relocation

## 2014-01-31 NOTE — Telephone Encounter (Signed)
New message     Talk to the nurse regarding fathers procedure scheduled for tomorrow

## 2014-01-31 NOTE — Telephone Encounter (Signed)
Follow up    Daughter calling has question regarding echo that done today.

## 2014-02-01 ENCOUNTER — Encounter (HOSPITAL_COMMUNITY)
Admission: RE | Disposition: A | Discharge status: Home / Self Care | Payer: Self-pay | Source: Ambulatory Visit | Attending: Internal Medicine

## 2014-02-01 ENCOUNTER — Telehealth: Payer: Self-pay | Admitting: Internal Medicine

## 2014-02-01 ENCOUNTER — Ambulatory Visit (HOSPITAL_COMMUNITY)
Admission: RE | Admit: 2014-02-01 | Discharge: 2014-02-01 | Disposition: A | Discharge status: Home / Self Care | Payer: Medicare PPO | Source: Ambulatory Visit | Attending: Internal Medicine | Admitting: Internal Medicine

## 2014-02-01 ENCOUNTER — Encounter (HOSPITAL_COMMUNITY): Payer: Self-pay | Admitting: Pharmacy Technician

## 2014-02-01 DIAGNOSIS — I5022 Chronic systolic (congestive) heart failure: Secondary | ICD-10-CM

## 2014-02-01 DIAGNOSIS — I2589 Other forms of chronic ischemic heart disease: Secondary | ICD-10-CM | POA: Insufficient documentation

## 2014-02-01 DIAGNOSIS — Z01812 Encounter for preprocedural laboratory examination: Secondary | ICD-10-CM

## 2014-02-01 DIAGNOSIS — Z95810 Presence of automatic (implantable) cardiac defibrillator: Secondary | ICD-10-CM

## 2014-02-01 DIAGNOSIS — Z951 Presence of aortocoronary bypass graft: Secondary | ICD-10-CM | POA: Insufficient documentation

## 2014-02-01 DIAGNOSIS — Z86718 Personal history of other venous thrombosis and embolism: Secondary | ICD-10-CM | POA: Insufficient documentation

## 2014-02-01 DIAGNOSIS — Z4502 Encounter for adjustment and management of automatic implantable cardiac defibrillator: Secondary | ICD-10-CM | POA: Insufficient documentation

## 2014-02-01 DIAGNOSIS — F319 Bipolar disorder, unspecified: Secondary | ICD-10-CM | POA: Insufficient documentation

## 2014-02-01 DIAGNOSIS — J4489 Other specified chronic obstructive pulmonary disease: Secondary | ICD-10-CM | POA: Insufficient documentation

## 2014-02-01 DIAGNOSIS — F1010 Alcohol abuse, uncomplicated: Secondary | ICD-10-CM | POA: Insufficient documentation

## 2014-02-01 DIAGNOSIS — I442 Atrioventricular block, complete: Secondary | ICD-10-CM

## 2014-02-01 DIAGNOSIS — Z91199 Patient's noncompliance with other medical treatment and regimen due to unspecified reason: Secondary | ICD-10-CM | POA: Insufficient documentation

## 2014-02-01 DIAGNOSIS — Z7982 Long term (current) use of aspirin: Secondary | ICD-10-CM | POA: Insufficient documentation

## 2014-02-01 DIAGNOSIS — I2510 Atherosclerotic heart disease of native coronary artery without angina pectoris: Secondary | ICD-10-CM | POA: Insufficient documentation

## 2014-02-01 DIAGNOSIS — I10 Essential (primary) hypertension: Secondary | ICD-10-CM | POA: Insufficient documentation

## 2014-02-01 DIAGNOSIS — Z9119 Patient's noncompliance with other medical treatment and regimen: Secondary | ICD-10-CM | POA: Insufficient documentation

## 2014-02-01 DIAGNOSIS — Z8673 Personal history of transient ischemic attack (TIA), and cerebral infarction without residual deficits: Secondary | ICD-10-CM | POA: Insufficient documentation

## 2014-02-01 DIAGNOSIS — E785 Hyperlipidemia, unspecified: Secondary | ICD-10-CM | POA: Insufficient documentation

## 2014-02-01 DIAGNOSIS — Z952 Presence of prosthetic heart valve: Secondary | ICD-10-CM | POA: Insufficient documentation

## 2014-02-01 DIAGNOSIS — I509 Heart failure, unspecified: Secondary | ICD-10-CM | POA: Insufficient documentation

## 2014-02-01 DIAGNOSIS — J449 Chronic obstructive pulmonary disease, unspecified: Secondary | ICD-10-CM | POA: Insufficient documentation

## 2014-02-01 DIAGNOSIS — I252 Old myocardial infarction: Secondary | ICD-10-CM | POA: Insufficient documentation

## 2014-02-01 DIAGNOSIS — I4891 Unspecified atrial fibrillation: Secondary | ICD-10-CM | POA: Insufficient documentation

## 2014-02-01 DIAGNOSIS — I482 Chronic atrial fibrillation, unspecified: Secondary | ICD-10-CM

## 2014-02-01 HISTORY — PX: PACEMAKER GENERATOR CHANGE: SHX5481

## 2014-02-01 HISTORY — PX: ICD GENERATOR CHANGE: SHX5854

## 2014-02-01 LAB — SURGICAL PCR SCREEN
MRSA, PCR: NEGATIVE
Staphylococcus aureus: NEGATIVE

## 2014-02-01 SURGERY — PACEMAKER GENERATOR CHANGE
Anesthesia: LOCAL

## 2014-02-01 MED ORDER — ONDANSETRON HCL 4 MG/2ML IJ SOLN: 4.0000 mg | Freq: Four times a day (QID) | INTRAMUSCULAR | Status: DC | PRN

## 2014-02-01 MED ORDER — LISINOPRIL 2.5 MG PO TABS
2.5000 mg | ORAL_TABLET | Freq: Every day | ORAL | Status: DC
  Filled 2014-02-01: qty 1

## 2014-02-01 MED ORDER — LIDOCAINE HCL (PF) 1 % IJ SOLN
INTRAMUSCULAR | Status: AC
  Filled 2014-02-01: qty 60

## 2014-02-01 MED ORDER — GENTAMICIN SULFATE 40 MG/ML IJ SOLN
80.0000 mg | INTRAMUSCULAR | Status: DC
  Filled 2014-02-01 (×2): qty 2

## 2014-02-01 MED ORDER — SODIUM CHLORIDE 0.9 % IV SOLN: INTRAVENOUS | Status: AC

## 2014-02-01 MED ORDER — SODIUM CHLORIDE 0.9 % IV SOLN
INTRAVENOUS | Status: DC
  Administered 2014-02-01: 07:00:00 via INTRAVENOUS

## 2014-02-01 MED ORDER — LISINOPRIL 2.5 MG PO TABS: 2.5000 mg | ORAL_TABLET | Freq: Every day | ORAL | Status: DC

## 2014-02-01 MED ORDER — CHLORHEXIDINE GLUCONATE 4 % EX LIQD
60.0000 mL | Freq: Once | CUTANEOUS | Status: DC
  Filled 2014-02-01: qty 60

## 2014-02-01 MED ORDER — CEFAZOLIN SODIUM-DEXTROSE 2-3 GM-% IV SOLR: 2.0000 g | INTRAVENOUS | Status: DC

## 2014-02-01 MED ORDER — MUPIROCIN 2 % EX OINT
TOPICAL_OINTMENT | CUTANEOUS | Status: AC
  Administered 2014-02-01: 1 via TOPICAL
  Filled 2014-02-01: qty 22

## 2014-02-01 MED ORDER — ACETAMINOPHEN 325 MG PO TABS
325.0000 mg | ORAL_TABLET | ORAL | Status: DC | PRN
  Filled 2014-02-01: qty 2

## 2014-02-01 MED ORDER — MUPIROCIN 2 % EX OINT
1.0000 "application " | TOPICAL_OINTMENT | Freq: Once | CUTANEOUS | Status: AC
  Administered 2014-02-01: 1 via TOPICAL
  Filled 2014-02-01: qty 22

## 2014-02-01 MED ORDER — HEPARIN (PORCINE) IN NACL 2-0.9 UNIT/ML-% IJ SOLN
INTRAMUSCULAR | Status: AC
  Filled 2014-02-01: qty 1000

## 2014-02-01 NOTE — Interval H&P Note (Signed)
ICD Criteria  Current LVEF:20% ;Obtained < 1 month ago.  NYHA Functional Classification: Class II  Heart Failure History:  Yes, Duration of heart failure since onset is > 9 months  Non-Ischemic Dilated Cardiomyopathy History:  No.  Atrial Fibrillation/Atrial Flutter:  No.  Ventricular Tachycardia History:  No.  Cardiac Arrest History:  No  History of Syndromes with Risk of Sudden Death:  No.  Previous ICD:  Yes, ICD Type:  CRT-D, Reason for ICD:  Primary prevention.  LVEF is not available  Electrophysiology Study: No.  Prior MI: Yes, Most recent MI timeframe is > 40 days.  PPM: No.  OSA:  No  Patient Life Expectancy of >=1 year: Yes.  Anticoagulation Therapy:  Patient is NOT on anticoagulation therapy.   Beta Blocker Therapy:  Yes.   Ace Inhibitor/ARB Therapy:  Yes.History and Physical Interval Note:  02/01/2014 7:12 AM  [name]  has presented today for surgery, with the diagnosis of eol  The various methods of treatment have been discussed with the patient and family. After consideration of risks, benefits and other options for treatment, the patient has consented to  Procedure(s): PACEMAKER GENERATOR CHANGE (N/A) as a surgical intervention .  The patient's history has been reviewed, patient examined, no change in status, stable for surgery.  I have reviewed the patient's chart and labs.  Questions were answered to the patient's satisfaction.     Sherryl Manges

## 2014-02-01 NOTE — H&P (View-Only) (Signed)
Patient Care Team: Cain Saupe, MD as PCP - General (Family Medicine)   HPI  [name] is a 68 y.o. male  seen as an add-on today because of his device having reached EOS. He is status post CRT-D implantation originally undertaken by Dr. Ty Hilts. Initial procedure was in 2006; AV junction ablation in 2007   with an upgrade to a CRT-D in 2009. This is for primary prevention.   He ended up requiring surgical  the deployment of an LV lead.    He has been poorly compliant with cardiology and with EP  He is from alcohol abuse and not withstanding his mitral valve replacement with a bioprosthesis replacing a thrombosed Mallie Mussel he is not on anticoagulation.   he continues to drink although he says just on weekends.   He does not have peripheral edema; he denies chest pain.  Ejection fraction 5/14 was 45% by echo    Past Medical History  Diagnosis Date  . DVT (deep venous thrombosis)   . 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 COPD (chronic obstructive pulmonary disease)   . Depression   . Hypertension   . Dyslipidemia   . Ventral hernia   . CAD (coronary artery disease)   . Hernia   . Chronic systolic congestive heart failure   . Bipolar disorder   . Anxiety   . Atrial fibrillation     persistent  . Alcohol abuse   . Complete heart block     s/p prior AV nodal ablation  . Stroke   . CVA (cerebral vascular accident)     multiple prior embolic strokes  . Pacemaker     Past Surgical History  Procedure Laterality Date  . Cystoscopy w/ ureteral stent placement  07/12/2011    Procedure: CYSTOSCOPY WITH RETROGRADE PYELOGRAM/URETERAL STENT PLACEMENT;  Surgeon: Sebastian Ache, MD;  Location: [facility] OR;  Service: Urology;  Laterality: Left;  . Coronary artery bypass graft    . Hernia repair    . Mitral valve replacement      remote [facility] valve 5916 with redo tissue valve 06/2006  . Pacemaker  placement    . Av node ablation      Current Outpatient Prescriptions  Medication Sig Dispense Refill  . albuterol-ipratropium (COMBIVENT) 18-103 MCG/ACT inhaler Inhale 2 puffs into the lungs every 4 (four) hours.      Marland Kitchen aspirin EC 81 MG tablet Take 1 tablet (81 mg total) by mouth daily.  30 tablet  3  . atorvastatin (LIPITOR) 40 MG tablet Take 40 mg by mouth once.      . carvedilol (COREG) 6.25 MG tablet Take 6.25 mg by mouth 2 (two) times daily with a meal.      . furosemide (LASIX) 40 MG tablet Take 40 mg by mouth once.      . potassium chloride (MICRO-K) 10 MEQ CR capsule Take 10 mEq by mouth once.       No current facility-administered medications for this visit.    No Known Allergies  Review of Systems negative except from HPI and PMH  Physical Exam BP 137/82  Pulse 76  Ht 5\' 7"  (1.702 m)  Wt 136 lb 6 oz (61.859 kg)  BMI 21.35 kg/m2 Well developed and well nourished in no acute distress HENT normal E scleral and icterus clear Neck Supple JVP flat; carotids brisk and full Clear to  ausculation Device pocket well healed; without hematoma or erythema.  There is no tethering   Regular rate and rhythm, no murmurs gallops or rub Soft with active bowel sounds No clubbing cyanosis  Edema Alert and oriented x2. He thinks it April 2015. He is unaware of the present.  grossly normal motor and sensory function Skin Warm and Dry    Assessment and  plan   Previously implanted CRT-D now VOS  Nonischemic cardiomyopathy with interval improvement most recently 40-45%  Complete heart block-device dependent status post AV junction ablation  Atrial fibrillation-permanent  Noncompliance precluding anticoagulation  Patient presents with a device at EOS which is device dependent as his unpredictable*longevity. Hence we'll undertake urgent device generator replacement.  Left ventricular function is impaired at 30% or less. We'll replace his device was CRT-D. Will anticipate use of  a Medtronic device as it is smaller in a submuscular pocket relocation 

## 2014-02-01 NOTE — Telephone Encounter (Signed)
Informed dtr wound check follow up date.  She was asking about cardiology f/u and I explained that I would review with Graciela Husbands and get back with her. She is ok with this.

## 2014-02-01 NOTE — Telephone Encounter (Signed)
New message     Returning a nurses call.  Please call on cell phone [phone]---home number is out of order.

## 2014-02-01 NOTE — Discharge Instructions (Signed)
Pacemaker Battery Change, Care After Refer to this sheet in the next few weeks. These instructions provide you with information on caring for yourself after your procedure. Your health care provider may also give you more specific instructions. Your treatment has been planned according to current medical practices, but problems sometimes occur. Call your health care provider if you have any problems or questions after your procedure. WHAT TO EXPECT AFTER THE PROCEDURE After your procedure, it is typical to have the following sensations:  Soreness at the pacemaker site. HOME CARE INSTRUCTIONS   Keep the incision clean and dry. Remove dressing in 24 hours. Leave steri strips in place.  You may shower beginning 7 days after your procedure.  For the first week after the replacement, avoid stretching motions that pull at the incision site, and avoid heavy exercise with the arm that is on the same side as the incision.  Take medicines only as directed by your health care provider.  Keep all follow-up visits as directed by your health care provider.  Do not drive for 7 days. SEEK MEDICAL CARE IF:   You have pain at the incision site that is not relieved by over-the-counter or prescription medicine.  There is drainage or pus from the incision site.  There is swelling larger than a lime at the incision site.  You develop red streaking that extends above or below the incision site.  You feel brief, intermittent palpitations, light-headedness, or any symptoms that you feel might be related to your heart. SEEK IMMEDIATE MEDICAL CARE IF:   You experience chest pain that is different than the pain at the pacemaker site.  You experience shortness of breath.  You have palpitations or irregular heartbeat.  You have light-headedness that does not go away quickly.  You faint.  You have pain that gets worse and is not relieved by medicine. Document Released: 03/15/2013 Document Revised:  10/09/2013 Document Reviewed: 03/15/2013 [facility] Patient Information 2015 [place], [place]. This information is not intended to replace advice given to you by your health care provider. Make sure you discuss any questions you have with your health care provider.

## 2014-02-01 NOTE — Telephone Encounter (Signed)
New message   Patient daughter calling back stating someone call her father.

## 2014-02-01 NOTE — CV Procedure (Signed)
Preoperative diagnosis ischemic cardiomyopathy  Complete heart bkock previous CRT-D at [facility] Postoperative diagnosis same/   Procedure: Generator replacement; Assessment of HV leads  Pocket revision;   Following informed consent the patient was brought to the electrophysiology laboratory in place of the fluoroscopic table in the supine position after routine prep and drape lidocaine was infiltrated in the region of the previous incision and carried down to later the device pocket using sharp dissection and electrocautery. The pocket was opened the device was freed up and was explanted.  Interrogation of the previously implanted ICD  dventricular lead Medtronic [phone]  Done through the new device listed below demonstrated an R wave of NONE  millivolts., and impedance of 627 ohms, and a pacing threshold of 1 volts at 0.4 msec.     Interrogation of the previously implanted Left ventricular lead Medtronic 5071  demonstrated an R wave of NONE  millivolts., and impedance of 342 ohms, and a pacing threshold of 1.25 volts at 0.4 msec.    The previously implanted atrial lead Medtronic 5076 demonstrated a FIB-wave amplitude of  1.1 milllivolts and impedance of  570 ohms, and a pacing threshold of  N/A volts   The leads were inspected. Repair was not needed. The leads were then attached to a Medtronic VIVA pulse generator, serial number [id number].     High voltage impedances were 59/50 ohms  The pocket was expanded caudally about 1 cm to allow for replacement of the device and as this was the pts fourth procedure, he received an antimicorbial pouch.   The pocket was irrigated with antibiotic containing saline solution hemostasis was assured and the leads and the device were placed in the pocket. Surgiecell was inserted along the cephalad extension area. The wound was then closed in 2 layers in normal fashion. steristrip dressing was applied  The patient tolerated the procedure without apparent  complication.  DFT testing was not performed  EBL Minimal   Sherryl Manges   \

## 2014-02-02 ENCOUNTER — Encounter: Payer: Self-pay | Admitting: Internal Medicine

## 2014-02-06 ENCOUNTER — Encounter (HOSPITAL_COMMUNITY): Payer: Self-pay | Admitting: Emergency Medicine

## 2014-02-06 ENCOUNTER — Emergency Department (HOSPITAL_COMMUNITY): Payer: Medicare PPO

## 2014-02-06 ENCOUNTER — Inpatient Hospital Stay (HOSPITAL_COMMUNITY)
Admission: EM | Admit: 2014-02-06 | Discharge: 2014-02-09 | DRG: 292 | Disposition: A | Discharge status: Home Health | Payer: Medicare PPO | Attending: Family Medicine | Admitting: Family Medicine

## 2014-02-06 DIAGNOSIS — Z950 Presence of cardiac pacemaker: Secondary | ICD-10-CM

## 2014-02-06 DIAGNOSIS — I5022 Chronic systolic (congestive) heart failure: Secondary | ICD-10-CM

## 2014-02-06 DIAGNOSIS — I5023 Acute on chronic systolic (congestive) heart failure: Principal | ICD-10-CM

## 2014-02-06 DIAGNOSIS — I2510 Atherosclerotic heart disease of native coronary artery without angina pectoris: Secondary | ICD-10-CM | POA: Diagnosis present

## 2014-02-06 DIAGNOSIS — I472 Ventricular tachycardia, unspecified: Secondary | ICD-10-CM

## 2014-02-06 DIAGNOSIS — Z9119 Patient's noncompliance with other medical treatment and regimen: Secondary | ICD-10-CM

## 2014-02-06 DIAGNOSIS — J4489 Other specified chronic obstructive pulmonary disease: Secondary | ICD-10-CM | POA: Diagnosis present

## 2014-02-06 DIAGNOSIS — F1020 Alcohol dependence, uncomplicated: Secondary | ICD-10-CM | POA: Diagnosis present

## 2014-02-06 DIAGNOSIS — F319 Bipolar disorder, unspecified: Secondary | ICD-10-CM | POA: Diagnosis present

## 2014-02-06 DIAGNOSIS — E785 Hyperlipidemia, unspecified: Secondary | ICD-10-CM

## 2014-02-06 DIAGNOSIS — Z954 Presence of other heart-valve replacement: Secondary | ICD-10-CM

## 2014-02-06 DIAGNOSIS — Z87891 Personal history of nicotine dependence: Secondary | ICD-10-CM

## 2014-02-06 DIAGNOSIS — J449 Chronic obstructive pulmonary disease, unspecified: Secondary | ICD-10-CM

## 2014-02-06 DIAGNOSIS — N179 Acute kidney failure, unspecified: Secondary | ICD-10-CM

## 2014-02-06 DIAGNOSIS — I10 Essential (primary) hypertension: Secondary | ICD-10-CM | POA: Diagnosis present

## 2014-02-06 DIAGNOSIS — Z7901 Long term (current) use of anticoagulants: Secondary | ICD-10-CM

## 2014-02-06 DIAGNOSIS — Z951 Presence of aortocoronary bypass graft: Secondary | ICD-10-CM

## 2014-02-06 DIAGNOSIS — I5189 Other ill-defined heart diseases: Secondary | ICD-10-CM | POA: Diagnosis present

## 2014-02-06 DIAGNOSIS — I442 Atrioventricular block, complete: Secondary | ICD-10-CM

## 2014-02-06 DIAGNOSIS — Z86718 Personal history of other venous thrombosis and embolism: Secondary | ICD-10-CM

## 2014-02-06 DIAGNOSIS — Z8673 Personal history of transient ischemic attack (TIA), and cerebral infarction without residual deficits: Secondary | ICD-10-CM

## 2014-02-06 DIAGNOSIS — Z91199 Patient's noncompliance with other medical treatment and regimen due to unspecified reason: Secondary | ICD-10-CM

## 2014-02-06 DIAGNOSIS — I4891 Unspecified atrial fibrillation: Secondary | ICD-10-CM | POA: Diagnosis present

## 2014-02-06 DIAGNOSIS — J982 Interstitial emphysema: Secondary | ICD-10-CM

## 2014-02-06 DIAGNOSIS — F411 Generalized anxiety disorder: Secondary | ICD-10-CM | POA: Diagnosis present

## 2014-02-06 DIAGNOSIS — I513 Intracardiac thrombosis, not elsewhere classified: Secondary | ICD-10-CM | POA: Diagnosis present

## 2014-02-06 DIAGNOSIS — I482 Chronic atrial fibrillation, unspecified: Secondary | ICD-10-CM

## 2014-02-06 DIAGNOSIS — I509 Heart failure, unspecified: Secondary | ICD-10-CM

## 2014-02-06 LAB — BASIC METABOLIC PANEL
Anion gap: 11 (ref 5–15)
BUN: 9 mg/dL (ref 6–23)
CHLORIDE: 104 meq/L (ref 96–112)
CO2: 25 meq/L (ref 19–32)
CREATININE: 0.83 mg/dL (ref 0.50–1.35)
Calcium: 9.2 mg/dL (ref 8.4–10.5)
GFR calc Af Amer: >90 mL/min (ref >90)
GFR calc non Af Amer: 90 mL/min — ABNORMAL LOW (ref >90)
Glucose, Bld: 114 mg/dL — ABNORMAL HIGH (ref 70–99)
Potassium: 4.6 mEq/L (ref 3.7–5.3)
Sodium: 140 mEq/L (ref 137–147)

## 2014-02-06 LAB — I-STAT TROPONIN, ED: Troponin i, poc: 0.01 ng/mL (ref 0.00–0.08)

## 2014-02-06 LAB — CBC
HEMATOCRIT: 43.1 % (ref 39.0–52.0)
Hemoglobin: 14.3 g/dL (ref 13.0–17.0)
MCH: 30.3 pg (ref 26.0–34.0)
MCHC: 33.2 g/dL (ref 30.0–36.0)
MCV: 91.3 fL (ref 78.0–100.0)
Platelets: 126 10*3/uL — ABNORMAL LOW (ref 150–400)
RBC: 4.72 MIL/uL (ref 4.22–5.81)
RDW: 14.1 % (ref 11.5–15.5)
WBC: 9.9 10*3/uL (ref 4.0–10.5)

## 2014-02-06 LAB — PRO B NATRIURETIC PEPTIDE: Pro B Natriuretic peptide (BNP): 3968 pg/mL — ABNORMAL HIGH (ref 0–125)

## 2014-02-06 NOTE — ED Notes (Signed)
Pt reports SOB x 1 hour. Reports increase bilateral leg swelling x "several weeks." Per family, pt hasn't taken his medication (including Lasix) in appx 1 month "because sometimes he just doesn't feel like it." States he had pacemaker placed on Thursday with no complications. Pt denies CP. Pt speaks in complete sentences, NAD. AO x4.

## 2014-02-07 ENCOUNTER — Emergency Department (HOSPITAL_COMMUNITY): Payer: Medicare PPO

## 2014-02-07 ENCOUNTER — Ambulatory Visit: Payer: Medicare PPO | Admitting: Diagnostic Neuroimaging

## 2014-02-07 ENCOUNTER — Encounter (HOSPITAL_COMMUNITY): Payer: Self-pay

## 2014-02-07 DIAGNOSIS — Z8673 Personal history of transient ischemic attack (TIA), and cerebral infarction without residual deficits: Secondary | ICD-10-CM

## 2014-02-07 DIAGNOSIS — I4891 Unspecified atrial fibrillation: Secondary | ICD-10-CM | POA: Diagnosis present

## 2014-02-07 DIAGNOSIS — I5023 Acute on chronic systolic (congestive) heart failure: Secondary | ICD-10-CM | POA: Diagnosis present

## 2014-02-07 DIAGNOSIS — F319 Bipolar disorder, unspecified: Secondary | ICD-10-CM | POA: Diagnosis present

## 2014-02-07 DIAGNOSIS — I442 Atrioventricular block, complete: Secondary | ICD-10-CM | POA: Diagnosis present

## 2014-02-07 DIAGNOSIS — I513 Intracardiac thrombosis, not elsewhere classified: Secondary | ICD-10-CM | POA: Diagnosis present

## 2014-02-07 DIAGNOSIS — I2510 Atherosclerotic heart disease of native coronary artery without angina pectoris: Secondary | ICD-10-CM | POA: Diagnosis present

## 2014-02-07 DIAGNOSIS — Z954 Presence of other heart-valve replacement: Secondary | ICD-10-CM | POA: Diagnosis not present

## 2014-02-07 DIAGNOSIS — I509 Heart failure, unspecified: Secondary | ICD-10-CM

## 2014-02-07 DIAGNOSIS — Z9119 Patient's noncompliance with other medical treatment and regimen: Secondary | ICD-10-CM | POA: Diagnosis not present

## 2014-02-07 DIAGNOSIS — F411 Generalized anxiety disorder: Secondary | ICD-10-CM | POA: Diagnosis present

## 2014-02-07 DIAGNOSIS — J449 Chronic obstructive pulmonary disease, unspecified: Secondary | ICD-10-CM | POA: Diagnosis present

## 2014-02-07 DIAGNOSIS — Z86718 Personal history of other venous thrombosis and embolism: Secondary | ICD-10-CM | POA: Diagnosis not present

## 2014-02-07 DIAGNOSIS — Z950 Presence of cardiac pacemaker: Secondary | ICD-10-CM | POA: Diagnosis not present

## 2014-02-07 DIAGNOSIS — E785 Hyperlipidemia, unspecified: Secondary | ICD-10-CM | POA: Diagnosis present

## 2014-02-07 DIAGNOSIS — Z951 Presence of aortocoronary bypass graft: Secondary | ICD-10-CM | POA: Diagnosis not present

## 2014-02-07 DIAGNOSIS — Z91199 Patient's noncompliance with other medical treatment and regimen due to unspecified reason: Secondary | ICD-10-CM | POA: Diagnosis not present

## 2014-02-07 DIAGNOSIS — I10 Essential (primary) hypertension: Secondary | ICD-10-CM | POA: Diagnosis present

## 2014-02-07 DIAGNOSIS — F1020 Alcohol dependence, uncomplicated: Secondary | ICD-10-CM | POA: Diagnosis present

## 2014-02-07 DIAGNOSIS — Z87891 Personal history of nicotine dependence: Secondary | ICD-10-CM | POA: Diagnosis not present

## 2014-02-07 DIAGNOSIS — I5189 Other ill-defined heart diseases: Secondary | ICD-10-CM | POA: Diagnosis present

## 2014-02-07 DIAGNOSIS — I219 Acute myocardial infarction, unspecified: Secondary | ICD-10-CM

## 2014-02-07 LAB — COMPREHENSIVE METABOLIC PANEL
ALK PHOS: 92 U/L (ref 39–117)
ALT: 33 U/L (ref 0–53)
ANION GAP: 15 (ref 5–15)
AST: 31 U/L (ref 0–37)
Albumin: 3.5 g/dL (ref 3.5–5.2)
BUN: 8 mg/dL (ref 6–23)
CO2: 26 meq/L (ref 19–32)
Calcium: 9.3 mg/dL (ref 8.4–10.5)
Chloride: 101 mEq/L (ref 96–112)
Creatinine, Ser: 0.81 mg/dL (ref 0.50–1.35)
GLUCOSE: 100 mg/dL — AB (ref 70–99)
POTASSIUM: 4 meq/L (ref 3.7–5.3)
Sodium: 142 mEq/L (ref 137–147)
TOTAL PROTEIN: 7.6 g/dL (ref 6.0–8.3)
Total Bilirubin: 0.8 mg/dL (ref 0.3–1.2)

## 2014-02-07 LAB — CBC WITH DIFFERENTIAL/PLATELET
BASOS ABS: 0.1 10*3/uL (ref 0.0–0.1)
Basophils Relative: 1 % (ref 0–1)
EOS ABS: 0.5 10*3/uL (ref 0.0–0.7)
Eosinophils Relative: 5 % (ref 0–5)
HCT: 43.8 % (ref 39.0–52.0)
HEMOGLOBIN: 14.6 g/dL (ref 13.0–17.0)
Lymphocytes Relative: 14 % (ref 12–46)
Lymphs Abs: 1.3 10*3/uL (ref 0.7–4.0)
MCH: 29.7 pg (ref 26.0–34.0)
MCHC: 33.3 g/dL (ref 30.0–36.0)
MCV: 89.2 fL (ref 78.0–100.0)
MONO ABS: 1.2 10*3/uL — AB (ref 0.1–1.0)
Monocytes Relative: 12 % (ref 3–12)
NEUTROS PCT: 68 % (ref 43–77)
Neutro Abs: 6.5 10*3/uL (ref 1.7–7.7)
PLATELETS: 156 10*3/uL (ref 150–400)
RBC: 4.91 MIL/uL (ref 4.22–5.81)
RDW: 14.2 % (ref 11.5–15.5)
WBC: 9.6 10*3/uL (ref 4.0–10.5)

## 2014-02-07 LAB — RAPID URINE DRUG SCREEN, HOSP PERFORMED
Amphetamines: NOT DETECTED
BARBITURATES: NOT DETECTED
Benzodiazepines: NOT DETECTED
COCAINE: NOT DETECTED
OPIATES: NOT DETECTED
TETRAHYDROCANNABINOL: NOT DETECTED

## 2014-02-07 LAB — PROTIME-INR
INR: 1.15 (ref 0.00–1.49)
PROTHROMBIN TIME: 14.7 s (ref 11.6–15.2)

## 2014-02-07 LAB — APTT: aPTT: 35 seconds (ref 24–37)

## 2014-02-07 LAB — TSH: TSH: 1.8 u[IU]/mL (ref 0.350–4.500)

## 2014-02-07 LAB — TROPONIN I

## 2014-02-07 LAB — URIC ACID: Uric Acid, Serum: 5.6 mg/dL (ref 4.0–7.8)

## 2014-02-07 MED ORDER — VITAMIN B-1 100 MG PO TABS
100.0000 mg | ORAL_TABLET | Freq: Every day | ORAL | Status: DC
  Administered 2014-02-07 – 2014-02-09 (×3): 100 mg via ORAL
  Filled 2014-02-07 (×3): qty 1

## 2014-02-07 MED ORDER — THIAMINE HCL 100 MG/ML IJ SOLN
100.0000 mg | Freq: Every day | INTRAMUSCULAR | Status: DC
  Filled 2014-02-07 (×2): qty 1

## 2014-02-07 MED ORDER — ENOXAPARIN SODIUM 80 MG/0.8ML ~~LOC~~ SOLN
65.0000 mg | SUBCUTANEOUS | Status: AC
  Filled 2014-02-07: qty 0.8

## 2014-02-07 MED ORDER — CARVEDILOL 6.25 MG PO TABS
6.2500 mg | ORAL_TABLET | Freq: Two times a day (BID) | ORAL | Status: DC
  Administered 2014-02-07 – 2014-02-09 (×6): 6.25 mg via ORAL
  Filled 2014-02-07 (×7): qty 1

## 2014-02-07 MED ORDER — IOHEXOL 350 MG/ML SOLN
80.0000 mL | Freq: Once | INTRAVENOUS | Status: AC | PRN
  Administered 2014-02-07: 80 mL via INTRAVENOUS

## 2014-02-07 MED ORDER — ENOXAPARIN SODIUM 80 MG/0.8ML ~~LOC~~ SOLN
65.0000 mg | Freq: Two times a day (BID) | SUBCUTANEOUS | Status: DC
Start: 2014-02-07 — End: 2014-02-08
  Administered 2014-02-07 – 2014-02-08 (×2): 65 mg via SUBCUTANEOUS
  Filled 2014-02-07 (×4): qty 0.8

## 2014-02-07 MED ORDER — ALBUTEROL SULFATE (2.5 MG/3ML) 0.083% IN NEBU: 2.5000 mg | INHALATION_SOLUTION | RESPIRATORY_TRACT | Status: DC | PRN

## 2014-02-07 MED ORDER — ACETAMINOPHEN 650 MG RE SUPP: 650.0000 mg | Freq: Four times a day (QID) | RECTAL | Status: DC | PRN

## 2014-02-07 MED ORDER — ENOXAPARIN SODIUM 80 MG/0.8ML ~~LOC~~ SOLN
1.0000 mg/kg | Freq: Once | SUBCUTANEOUS | Status: AC
  Administered 2014-02-07: 65 mg via SUBCUTANEOUS
  Filled 2014-02-07: qty 0.8

## 2014-02-07 MED ORDER — ONDANSETRON HCL 4 MG/2ML IJ SOLN: 4.0000 mg | Freq: Four times a day (QID) | INTRAMUSCULAR | Status: DC | PRN

## 2014-02-07 MED ORDER — FOLIC ACID 1 MG PO TABS
1.0000 mg | ORAL_TABLET | Freq: Every day | ORAL | Status: DC
  Administered 2014-02-07 – 2014-02-09 (×3): 1 mg via ORAL
  Filled 2014-02-07 (×3): qty 1

## 2014-02-07 MED ORDER — ACETAMINOPHEN 325 MG PO TABS
650.0000 mg | ORAL_TABLET | Freq: Four times a day (QID) | ORAL | Status: DC | PRN
Start: 2014-02-07 — End: 2014-02-09

## 2014-02-07 MED ORDER — SODIUM CHLORIDE 0.9 % IJ SOLN
3.0000 mL | Freq: Two times a day (BID) | INTRAMUSCULAR | Status: DC
  Administered 2014-02-07 – 2014-02-09 (×3): 3 mL via INTRAVENOUS

## 2014-02-07 MED ORDER — ADULT MULTIVITAMIN W/MINERALS CH
1.0000 | ORAL_TABLET | Freq: Every day | ORAL | Status: DC
Start: 2014-02-07 — End: 2014-02-09
  Administered 2014-02-07 – 2014-02-09 (×3): 1 via ORAL
  Filled 2014-02-07 (×3): qty 1

## 2014-02-07 MED ORDER — LORAZEPAM 2 MG/ML IJ SOLN: 1.0000 mg | Freq: Four times a day (QID) | INTRAMUSCULAR | Status: DC | PRN

## 2014-02-07 MED ORDER — ONDANSETRON HCL 4 MG PO TABS: 4.0000 mg | ORAL_TABLET | Freq: Four times a day (QID) | ORAL | Status: DC | PRN

## 2014-02-07 MED ORDER — FUROSEMIDE 10 MG/ML IJ SOLN
80.0000 mg | Freq: Once | INTRAMUSCULAR | Status: AC
  Administered 2014-02-07: 80 mg via INTRAVENOUS
  Filled 2014-02-07: qty 8

## 2014-02-07 MED ORDER — FUROSEMIDE 10 MG/ML IJ SOLN
40.0000 mg | Freq: Two times a day (BID) | INTRAMUSCULAR | Status: DC
  Administered 2014-02-07 – 2014-02-09 (×5): 40 mg via INTRAVENOUS
  Filled 2014-02-07 (×6): qty 4

## 2014-02-07 MED ORDER — LISINOPRIL 2.5 MG PO TABS
2.5000 mg | ORAL_TABLET | Freq: Every day | ORAL | Status: DC
  Administered 2014-02-07 – 2014-02-09 (×3): 2.5 mg via ORAL
  Filled 2014-02-07 (×3): qty 1

## 2014-02-07 MED ORDER — LORAZEPAM 1 MG PO TABS: 1.0000 mg | ORAL_TABLET | Freq: Four times a day (QID) | ORAL | Status: DC | PRN

## 2014-02-07 MED ORDER — SODIUM CHLORIDE 0.9 % IJ SOLN
3.0000 mL | Freq: Two times a day (BID) | INTRAMUSCULAR | Status: DC
  Administered 2014-02-07 – 2014-02-09 (×4): 3 mL via INTRAVENOUS

## 2014-02-07 NOTE — Progress Notes (Signed)
ANTICOAGULATION CONSULT NOTE - Initial Consult  Pharmacy Consult for Lovenox Indication: atrial fibrillation  Allergies  Allergen Reactions  . Warfarin     Non compliance and ETOH abuse    Patient Measurements: Height: 5\' 7"  (170.2 cm) Weight: 143 lb 15.4 oz (65.3 kg) IBW/kg (Calculated) : 66.1  Vital Signs: Temp: 97.7 F (36.5 C) (09/02 0328) Temp src: Oral (09/02 0328) BP: 149/78 mmHg (09/02 0328) Pulse Rate: 62 (09/02 0328)  Labs:  Recent Labs  02/06/14 2240 02/07/14 0018  HGB 14.3  --   HCT 43.1  --   PLT 126*  --   APTT  --  35  LABPROT  --  14.7  INR  --  1.15  CREATININE 0.83  --     Estimated Creatinine Clearance: 80.9 ml/min (by C-G formula based on Cr of 0.83).   Medical History: Past Medical History  Diagnosis Date  . DVT (deep venous thrombosis)   . 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 COPD (chronic obstructive pulmonary disease)   . Depression   . Hypertension   . Dyslipidemia   . Ventral hernia   . CAD (coronary artery disease)   . Hernia   . Chronic systolic congestive heart failure   . Bipolar disorder   . Anxiety   . Atrial fibrillation     persistent  . Alcohol abuse   . Complete heart block     s/p prior AV nodal ablation  . Stroke   . CVA (cerebral vascular accident)     multiple prior embolic strokes  . Pacemaker     Medications:  Prescriptions prior to admission  Medication Sig Dispense Refill  . carvedilol (COREG) 6.25 MG tablet Take 6.25 mg by mouth 2 (two) times daily with a meal.       . lisinopril (PRINIVIL,ZESTRIL) 2.5 MG tablet Take 1 tablet (2.5 mg total) by mouth daily.  30 tablet  12    Assessment: 68 y.o. male presents with SOB. Noted recent pacemaker placement this past Thursday. To begin Lovenox for afib. Hgb wnl at baseline, plt slightly low. SCr stable.  Goal of Therapy:  Anti-Xa level 0.6-1 units/ml 4hrs after LMWH dose  given Monitor platelets by anticoagulation protocol: Yes   Plan:  1) Lovenox 65 mg SQ q12h. 2) Will f/u renal function, s/s bleeding  Christoper Fabian, PharmD, BCPS Clinical pharmacist, pager [phone] 02/07/2014,4:29 AM

## 2014-02-07 NOTE — Progress Notes (Signed)
Patient has been very cooperative with staff thus far. No s/s of alcohol withdrawal. Daughter and grandaughter was here with patient earlier but left to go home to get some rest. Daughter called me after leaving, and spoke with me about patient getting irritable and getting an attitude with her. The daughter has been telling the staff the truth about patient not taking care of himself, and about him drinking alcohol, but patient gets upset with her for telling the truth. Called daughter to give her an update on patient, but did not get an answer. Daughter has password for patient information due to patient hanging with the wrong crowd at home. She doesn't want people calling and getting information on the patient.
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
## 2014-02-07 NOTE — Progress Notes (Signed)
Admitted pt from the ED via stretcher, alert, oriented x 4, denies any chest pain,denies nausea and vomiting, dyspnea with exertion.Put on telemetry box # 3E24. Oriented to room,call bell, family at bedside. Dr. Toniann Fail text page of this admission. Still awaiting for orders.  02/07/14 0328  Vitals  Temp 97.7 F (36.5 C)  Temp src Oral  BP ! 149/78 mmHg  BP Location Left arm  BP Method Automatic  Patient Position (if appropriate) Sitting  Pulse Rate 62  Resp ! 22  Oxygen Therapy  SpO2 96 %  O2 Device None (Room air)  Height and Weight  Height 5\' 7"  (1.702 m)  Weight 65.3 kg (143 lb 15.4 oz)  Type of Scale Used Standing (Scale B)  Type of Weight Actual  BSA (Calculated - sq m) 1.76 sq meters  BMI (Calculated) 22.6  Weight in (lb) to have BMI = 25 159.3

## 2014-02-07 NOTE — ED Notes (Signed)
CT notified that the pt has an IV and is ready for his scan.  
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
## 2014-02-07 NOTE — ED Provider Notes (Signed)
CSN: [id number]     Arrival date & time 02/06/14  2230 History   First MD Initiated Contact with Patient 02/06/14 2353     Chief Complaint  Patient presents with  . Shortness of Breath     (Consider location/radiation/quality/duration/timing/severity/associated sxs/prior Treatment) HPI Patient with history of noncompliance with recent pacemaker placement placed Thursday by Dr. Graciela Husbands. He presents with shortness of breath that has been worse throughout the day especially tonight. He denies any chest pain. The shortness of breath is exacerbated by any exertion. Patient has also had increased bilateral lower extremity swelling worse on the left. He denies any pain. He's had a mild cough without sputum production. She's had no fever or chills. Family states patient has not been taking his Lasix as directed. He has a history of DVT and atrial fibrillation as well as embolic stroke but is not on any blood thinners at this point. Past Medical History  Diagnosis Date  . DVT (deep venous thrombosis)   . 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 COPD (chronic obstructive pulmonary disease)   . Depression   . Hypertension   . Dyslipidemia   . Ventral hernia   . CAD (coronary artery disease)   . Hernia   . Chronic systolic congestive heart failure   . Bipolar disorder   . Anxiety   . Atrial fibrillation     persistent  . Alcohol abuse   . Complete heart block     s/p prior AV nodal ablation  . Stroke   . CVA (cerebral vascular accident)     multiple prior embolic strokes  . Pacemaker    Past Surgical History  Procedure Laterality Date  . Cystoscopy w/ ureteral stent placement  07/12/2011    Procedure: CYSTOSCOPY WITH RETROGRADE PYELOGRAM/URETERAL STENT PLACEMENT;  Surgeon: Sebastian Ache, MD;  Location: [facility];  Service: Urology;  Laterality: Left;  . Coronary artery bypass graft    . Hernia repair    . Mitral valve replacement       remote [facility] valve 7829 with redo tissue valve 06/2006  . Pacemaker placement    . Av node ablation     Family History  Problem Relation Age of Onset  . Stroke    . Heart disease    . Alzheimer's disease Mother   . Heart attack Father    History  Substance Use Topics  . Smoking status: Former Smoker -- 2.00 packs/day    Types: Cigarettes    Quit date: 01/30/2003  . Smokeless tobacco: Never Used  . Alcohol Use: Yes     Comment: occasionally    Review of Systems  Constitutional: Negative for fever and chills.  Respiratory: Positive for cough and shortness of breath. Negative for chest tightness.   Cardiovascular: Positive for leg swelling. Negative for chest pain and palpitations.  Gastrointestinal: Negative for nausea, vomiting, abdominal pain, diarrhea and constipation.  Musculoskeletal: Negative for back pain, neck pain and neck stiffness.  Skin: Negative for rash and wound.  Neurological: Negative for dizziness, weakness, light-headedness, numbness and headaches.  All other systems reviewed and are negative.     Allergies  Warfarin  Home Medications   Prior to Admission medications   Medication Sig Start Date End Date Taking? Authorizing Provider  carvedilol (COREG) 6.25 MG tablet Take 6.25 mg by mouth 2 (two) times daily with a meal.  02/01/14  Yes Historical Provider, MD  lisinopril (PRINIVIL,ZESTRIL) 2.5 MG tablet Take 1 tablet (2.5 mg total) by mouth daily. 02/01/14  Yes Duke Salvia, MD   BP 158/78  Pulse 62  Temp(Src) 97.6 F (36.4 C) (Oral)  Resp 22  Ht 5\' 7"  (1.702 m)  Wt 144 lb (65.318 kg)  BMI 22.55 kg/m2  SpO2 93% Physical Exam  Nursing note and vitals reviewed. Constitutional: He is oriented to person, place, and time. He appears well-developed and well-nourished. No distress.  HENT:  Head: Normocephalic and atraumatic.  Mouth/Throat: Oropharynx is clear and moist.  Eyes: EOM are normal. Pupils are equal, round, and reactive to  light.  Neck: Normal range of motion. Neck supple.  Cardiovascular: Normal rate and regular rhythm.   Pulmonary/Chest: Effort normal. No respiratory distress. He has no wheezes. He has rales.  Crackles in bilateral bases the lungs.  Abdominal: Soft. Bowel sounds are normal. He exhibits no distension and no mass. There is no tenderness. There is no rebound and no guarding.  Musculoskeletal: Normal range of motion. He exhibits no edema and no tenderness.  Left calf is larger than the right. 1+ pitting edema bilaterally. 2+ dorsalis pedis pulses  Neurological: He is alert and oriented to person, place, and time.  Mildly hard of hearing. This all extremities without deficit. Sensation is grossly intact.  Skin: Skin is warm and dry. No rash noted. No erythema.  Psychiatric: He has a normal mood and affect. His behavior is normal.    ED Course  Procedures (including critical care time) Labs Review Labs Reviewed  CBC - Abnormal; Notable for the following:    Platelets 126 (*)    All other components within normal limits  BASIC METABOLIC PANEL - Abnormal; Notable for the following:    Glucose, Bld 114 (*)    GFR calc non Af Amer 90 (*)    All other components within normal limits  PRO B NATRIURETIC PEPTIDE - Abnormal; Notable for the following:    Pro B Natriuretic peptide (BNP) 3968.0 (*)    All other components within normal limits  PROTIME-INR  APTT  I-STAT TROPOININ, ED    Imaging Review Dg Chest 2 View  02/07/2014   CLINICAL DATA:  Shortness of breath, recent pacemaker exchange  EXAM: CHEST  2 VIEW  COMPARISON:  05/23/2013  FINDINGS: Left chest wall battery pack with unchanged lead tip positions. Status post median sternotomy and valve replacement. Enlarged cardiac silhouette. Mediastinal contours are prominent, similar. Elevated right hemidiaphragm. Calcified pleural plaques along the left lung base. Increased interstitial markings and hazy right lung airspace opacity. No definite  pleural effusion. No pneumothorax. Sequelae of right clavicle and rib fractures.  IMPRESSION: Prominent cardiomediastinal contours, similar to prior.  Right greater than left interstitial and hazy airspace opacities, in part chronic with a superimposed interstitial edema or atypical/viral infection suspected.   Electronically Signed   By: Jearld Lesch M.D.   On: 02/07/2014 00:05   Ct Angio Chest Pe W/cm &/or Wo Cm  02/07/2014   CLINICAL DATA:  SOB  EXAM: CT ANGIOGRAPHY CHEST WITH CONTRAST  TECHNIQUE: Multidetector CT imaging of the chest was performed using the standard protocol during bolus administration of intravenous contrast. Multiplanar CT image reconstructions and MIPs were obtained to evaluate the vascular anatomy.  CONTRAST:  63mL OMNIPAQUE IOHEXOL 350 MG/ML SOLN  COMPARISON:  11/08/2012  FINDINGS: Satisfactory opacification of pulmonary arteries noted, and there is no evidence of pulmonary emboli. The aorta remains largely non-opacified. Moderate coronary and aortic calcifications. Previous  median sternotomy and MVR. Left subclavian AICD. No pleural or pericardial effusion. Borderline enlarged sub carinal, bilateral hilar, and pretracheal lymph nodes. Subcentimeter prevascular and AP window lymph nodes. Probable filling defect in the left atrium suggesting mural thrombus. There is dilatation of the left ventricle. Linear scarring or atelectasis in the lingula. Patchy ground-glass opacities in basilar segments of both lower lobes. Visualized portions of upper abdomen unremarkable. Thoracic spine intact.  Review of the MIP images confirms the above findings.  IMPRESSION: 1. Negative for acute PE. 2. Left atrial enlargement with suspected mural thrombus. 3. Atherosclerosis, including aortic and coronary artery disease. Please note that although the presence of coronary artery calcium documents the presence of coronary artery disease, the severity of this disease and any potential stenosis cannot be  assessed on this non-gated CT examination. Assessment for potential risk factor modification, dietary therapy or pharmacologic therapy may be warranted, if clinically indicated.   Electronically Signed   By: Oley Balm M.D.   On: 02/07/2014 01:38     EKG Interpretation None      MDM   Final diagnoses:  Acute on chronic congestive heart failure, unspecified congestive heart failure type  Mural thrombus of heart   Discussed with [facility]. Recommend starting Lovenox. Will admit to telemetry bed. Patient remains stable in the emergency department.     Loren Racer, MD 02/07/14 [phone]
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
## 2014-02-07 NOTE — Progress Notes (Signed)
Patient seen and evaluated earlier this am by my associate. Please refer to his H and P for details regarding assessment and plan.  Cardiology consulted  Will reassess next am.  Penny Pia  MD

## 2014-02-07 NOTE — ED Notes (Signed)
[place], MD at bedside 

## 2014-02-07 NOTE — Progress Notes (Signed)
Patient evaluated for community based chronic disease management services with [facility] Care Management Program as a benefit of patient's [employer]. Spoke with patient at bedside to explain [facility] Care Management services.  Services have been accepted with written consent.  His daughter [name] [phone]) is his authorized contact.  Patient will receive a post discharge transition of care call and will be evaluated for monthly home visits for assessments and heart disease process education.  Left contact information and [facility] literature at bedside. Made Inpatient Case Manager aware that [facility] Care Management following. Of note, [facility] Care Management services does not replace or interfere with any services that are arranged by inpatient case management or social work.  For additional questions or referrals please contact Anibal Henderson BSN RN [facility] Liaison at [phone].

## 2014-02-08 DIAGNOSIS — I739 Peripheral vascular disease, unspecified: Secondary | ICD-10-CM

## 2014-02-08 DIAGNOSIS — R0602 Shortness of breath: Secondary | ICD-10-CM

## 2014-02-08 DIAGNOSIS — M7989 Other specified soft tissue disorders: Secondary | ICD-10-CM

## 2014-02-08 MED ORDER — ASPIRIN EC 325 MG PO TBEC
325.0000 mg | DELAYED_RELEASE_TABLET | Freq: Every day | ORAL | Status: DC
  Administered 2014-02-08 – 2014-02-09 (×2): 325 mg via ORAL
  Filled 2014-02-08 (×2): qty 1

## 2014-02-08 NOTE — Progress Notes (Signed)
VASCULAR LAB PRELIMINARY  ABI and bilateral lower extremity venous duplex completed.  ABI    RIGHT    LEFT    PRESSURE WAVEFORM  PRESSURE WAVEFORM  BRACHIAL 122 Triphasic  BRACHIAL 129 Triphasic   DP 51 Monophasic  DP 56 Monophasic   AT   AT    PT 69 Monophasic  PT 62 Monophasic   PER   PER    GREAT TOE  NA GREAT TOE  NA    RIGHT LEFT  ABI 0.53 0.48     Venous  Bilateral:  No evidence of DVT, superficial thrombosis, or Baker's Cyst.    Maryna Yeagle, RVT 02/08/2014, 10:13 AM
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
## 2014-02-08 NOTE — Progress Notes (Signed)
Utilization review completed. Magalie Almon, RN, BSN. 

## 2014-02-09 MED ORDER — ASPIRIN 325 MG PO TBEC: 325.0000 mg | DELAYED_RELEASE_TABLET | Freq: Every day | ORAL | Status: DC

## 2014-02-09 MED ORDER — FUROSEMIDE 40 MG PO TABS: 40.0000 mg | ORAL_TABLET | Freq: Every day | ORAL | Status: DC
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
## 2014-02-09 NOTE — Telephone Encounter (Signed)
She is asking about dad's care after hospital, asking what plan is. I explained that he is in-patient and they have care coordinators that help with these questions. Suggested she call and talk with nurse and care coordinator to discuss plan, as I do not know plan because he is in the hospital. She is agreeable to this and will call them.  She was also asking about if he needed to re-establish with Dr. Shirlee Latch and I told her I would review with Dr. Graciela Husbands and let her know.

## 2014-02-09 NOTE — Progress Notes (Signed)
Patient slept well throughout the night.  No signs of distress or complaints noted.  Patient states he "is ready to go home today".  Will give report to incoming nurse.

## 2014-02-09 NOTE — Progress Notes (Signed)
PAtient discharged home with daughter. Discharge instructions completed.
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
## 2014-02-09 NOTE — Telephone Encounter (Signed)
Follow up:    Pt's daughter Janee Morn called for information and what can be done about her farther's continued care.

## 2014-02-09 NOTE — Care Management Note (Signed)
    Page 1 of 2   02/09/2014     3:41:46 PM CARE MANAGEMENT NOTE 02/09/2014  Patient:  [name], [name]   Account Number:  [id number]  Date Initiated:  02/09/2014  Documentation initiated by:  [facility]  Subjective/Objective Assessment:   68 y.o. male with Hx of chronic systolic CHF, history of complete heart block status post pacemaker placement with recent generator replacement, history of CVA, AFIB; in with SOB.//Home alone     Action/Plan:   IV Lasix.//Access for dispsition needs.   Anticipated DC Date:  02/09/2014   Anticipated DC Plan:  HOME W HOME HEALTH SERVICES      DC Planning Services  CM consult      [facility]  HOME HEALTH   Choice offered to / List presented to:  C-4 Adult Children        HH arranged  HH-1 RN  HH-10 DISEASE MANAGEMENT  HH-2 PT      HH agency  [facility]   Status of service:  Completed, signed off Medicare Important Message given?  YES (If response is "NO", the following Medicare IM given date fields will be blank) Date Medicare IM given:  02/09/2014 Medicare IM given by:  [facility] Date Additional Medicare IM given:   Additional Medicare IM given by:    Discharge Disposition:  HOME W HOME HEALTH SERVICES  Per UR Regulation:  Reviewed for med. necessity/level of care/duration of stay  If discussed at Long Length of Stay Meetings, dates discussed:    Comments:  02/09/14 1515 Oletta Cohn, RN, BSN, [employer] [phone] Spoke with pt at bedside regarding discharge planning for [name]. Offered pt list of home health agencies to choose from.  Pt chose [facility] to render services. Tamala Bari of [facility] notified.  No DME needs identified at this time.  Pt going home with daughter, [name]. [address] [place], [place] [address] [phone]

## 2014-02-11 NOTE — Clinical Social Work Psychosocial (Addendum)
Clinical Social Work Department BRIEF PSYCHOSOCIAL ASSESSMENT 02/08/2014  Patient:  [name], [name]     Account Number:  [id number]     Admit date:  02/06/2014  Clinical Social Worker:  Iona Coach  Date/Time:  02/08/2014 12:00 M  Referred by:  Physician  Date Referred:  02/07/2014 Referred for  SNF Placement   Other Referral:   Interview type:  Patient Other interview type:    PSYCHOSOCIAL DATA Living Status:  ALONE Admitted from facility:   Level of care:   Primary support name:  Tessie Fass   [phone] Primary support relationship to patient:  CHILD, ADULT Degree of support available:   Good support per patient    CURRENT CONCERNS Current Concerns  None Noted   Other Concerns:    SOCIAL WORK ASSESSMENT / PLAN CSW met with patient this afternoon- he stated that he normally lives alone but he plans to go and stay with his daughter for a while until he feels stronger. CSW explained that referral made for SNF placement- however Physicial Therapy recommends home with [facility]. Patient does not want SNF placement and does not feel that he needs it.  Patient denies any CSW needs.   Assessment/plan status:  No Further Intervention Required Other assessment/ plan:   Information/referral to community resources:   RNCM will arrange HHPT    PATIENT'S/FAMILY'S RESPONSE TO PLAN OF CARE: Patient is alert and oriented; very pleasant appearing gentleman who was lying in bed during the visit. Patient relates that he is feelig better and is ready to return home. He has spoken to his daughter and plans to go stay with his daughter until he is feeling better. Patient denies any concerns of needs at this time.  RNCM has confirmed d/c plan and arranged for HHPT.  No further CSW needs. CSW signing off.

## 2014-02-13 ENCOUNTER — Ambulatory Visit (HOSPITAL_COMMUNITY)
Admit: 2014-02-13 | Discharge: 2014-02-13 | Disposition: A | Discharge status: Home / Self Care | Payer: Medicare PPO | Source: Ambulatory Visit | Attending: Cardiology | Admitting: Cardiology

## 2014-02-13 VITALS — BP 114/62 | HR 84 | Wt 145.0 lb

## 2014-02-13 DIAGNOSIS — I4891 Unspecified atrial fibrillation: Secondary | ICD-10-CM | POA: Diagnosis not present

## 2014-02-13 DIAGNOSIS — Z8673 Personal history of transient ischemic attack (TIA), and cerebral infarction without residual deficits: Secondary | ICD-10-CM | POA: Insufficient documentation

## 2014-02-13 DIAGNOSIS — Z87891 Personal history of nicotine dependence: Secondary | ICD-10-CM | POA: Insufficient documentation

## 2014-02-13 DIAGNOSIS — Z954 Presence of other heart-valve replacement: Secondary | ICD-10-CM | POA: Diagnosis not present

## 2014-02-13 DIAGNOSIS — R9431 Abnormal electrocardiogram [ECG] [EKG]: Secondary | ICD-10-CM | POA: Diagnosis not present

## 2014-02-13 DIAGNOSIS — I509 Heart failure, unspecified: Secondary | ICD-10-CM | POA: Diagnosis not present

## 2014-02-13 DIAGNOSIS — I739 Peripheral vascular disease, unspecified: Secondary | ICD-10-CM | POA: Diagnosis not present

## 2014-02-13 DIAGNOSIS — I10 Essential (primary) hypertension: Secondary | ICD-10-CM | POA: Diagnosis not present

## 2014-02-13 DIAGNOSIS — Z7982 Long term (current) use of aspirin: Secondary | ICD-10-CM | POA: Diagnosis not present

## 2014-02-13 DIAGNOSIS — Z950 Presence of cardiac pacemaker: Secondary | ICD-10-CM | POA: Diagnosis not present

## 2014-02-13 DIAGNOSIS — I2510 Atherosclerotic heart disease of native coronary artery without angina pectoris: Secondary | ICD-10-CM | POA: Diagnosis not present

## 2014-02-13 DIAGNOSIS — E785 Hyperlipidemia, unspecified: Secondary | ICD-10-CM | POA: Insufficient documentation

## 2014-02-13 DIAGNOSIS — I5022 Chronic systolic (congestive) heart failure: Secondary | ICD-10-CM | POA: Diagnosis not present

## 2014-02-13 MED ORDER — ATORVASTATIN CALCIUM 20 MG PO TABS: 20.0000 mg | ORAL_TABLET | Freq: Every day | ORAL | Status: DC

## 2014-02-13 MED ORDER — FUROSEMIDE 40 MG PO TABS: ORAL_TABLET | ORAL | Status: DC

## 2014-02-13 MED ORDER — LISINOPRIL 5 MG PO TABS: 5.0000 mg | ORAL_TABLET | Freq: Every day | ORAL | Status: DC

## 2014-02-13 NOTE — Patient Instructions (Signed)
Increase Furosemide to 1 tab (40 mg) in AM and 1/2 tab (20 mg) in PM  Increase Lisinopril to 5 mg daily, can take 2 tabs until you run out then a new prescription for 5 mg tabs have been sent to [facility]  Start Atorvastatin 20 mg daily  Labs in 10 days (bmet, bnp)  Fasting Labs in 2 months (lipid/liver)  Your physician recommends that you schedule a follow-up appointment in: 2 weeks

## 2014-02-14 ENCOUNTER — Ambulatory Visit: Payer: Self-pay
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
## 2014-02-14 NOTE — Telephone Encounter (Signed)
Was calling to advise good idea to re-establish follow up with Dr. Shirlee Latch -- pt saw him this morning. Made them aware that follow up would be back with Dr. Ladona Ridgel. She verbalized understanding.

## 2014-02-16 ENCOUNTER — Encounter: Payer: Self-pay | Admitting: *Deleted

## 2014-02-22 ENCOUNTER — Ambulatory Visit (INDEPENDENT_AMBULATORY_CARE_PROVIDER_SITE_OTHER): Payer: Commercial Managed Care - HMO | Admitting: *Deleted

## 2014-02-22 DIAGNOSIS — I442 Atrioventricular block, complete: Secondary | ICD-10-CM

## 2014-02-22 DIAGNOSIS — I509 Heart failure, unspecified: Secondary | ICD-10-CM

## 2014-02-22 DIAGNOSIS — I472 Ventricular tachycardia, unspecified: Secondary | ICD-10-CM

## 2014-02-22 DIAGNOSIS — I4891 Unspecified atrial fibrillation: Secondary | ICD-10-CM

## 2014-02-22 DIAGNOSIS — I5023 Acute on chronic systolic (congestive) heart failure: Secondary | ICD-10-CM

## 2014-02-22 DIAGNOSIS — I482 Chronic atrial fibrillation, unspecified: Secondary | ICD-10-CM

## 2014-02-22 DIAGNOSIS — I4729 Other ventricular tachycardia: Secondary | ICD-10-CM

## 2014-02-23 LAB — MDC_IDC_ENUM_SESS_TYPE_INCLINIC
Battery Remaining Longevity: 91 mo
Brady Statistic AP VP Percent: 0.18 %
Brady Statistic AS VS Percent: 4.21 %
Brady Statistic RV Percent Paced: 94.8 %
Date Time Interrogation Session: 20150917162155
HIGH POWER IMPEDANCE MEASURED VALUE: 45 Ohm
HIGH POWER IMPEDANCE MEASURED VALUE: 58 Ohm
HighPow Impedance: 171 Ohm
Lead Channel Impedance Value: 4047 Ohm
Lead Channel Impedance Value: 437 Ohm
Lead Channel Impedance Value: 570 Ohm
Lead Channel Impedance Value: 589 Ohm
Lead Channel Pacing Threshold Amplitude: 0.75 V
Lead Channel Pacing Threshold Amplitude: 1.5 V
Lead Channel Pacing Threshold Pulse Width: 0.4 ms
Lead Channel Pacing Threshold Pulse Width: 0.4 ms
Lead Channel Sensing Intrinsic Amplitude: 1.375 mV
Lead Channel Sensing Intrinsic Amplitude: 2 mV
Lead Channel Setting Pacing Amplitude: 2 V
Lead Channel Setting Pacing Amplitude: 2.5 V
Lead Channel Setting Sensing Sensitivity: 0.45 mV
MDC IDC MSMT BATTERY VOLTAGE: 3.13 V
MDC IDC MSMT LEADCHNL LV IMPEDANCE VALUE: 4047 Ohm
MDC IDC SET LEADCHNL LV PACING AMPLITUDE: 2.75 V
MDC IDC SET LEADCHNL LV PACING PULSEWIDTH: 0.4 ms
MDC IDC SET LEADCHNL RV PACING PULSEWIDTH: 0.4 ms
MDC IDC SET ZONE DETECTION INTERVAL: 350 ms
MDC IDC STAT BRADY AP VS PERCENT: 0 %
MDC IDC STAT BRADY AS VP PERCENT: 95.61 %
MDC IDC STAT BRADY RA PERCENT PACED: 0.18 %
Zone Setting Detection Interval: 270 ms
Zone Setting Detection Interval: 300 ms
Zone Setting Detection Interval: 350 ms
Zone Setting Detection Interval: 400 ms

## 2014-02-23 NOTE — Progress Notes (Signed)
Wound check appointment. Steri-strips removed by pt prior to ov (7 days after implant). Wound without redness or edema. Incision edges approximated, wound well healed. Stitch removed from incision site. Normal device function. Thresholds, sensing, and impedances consistent with implant measurements. Auto capture programmed on. Histogram distribution appropriate for patient and level of activity. 1 mode switch (<1%) x 2 sec @ 152/70---AT. No high ventricular rates noted. Patient educated about wound care, arm mobility, lifting restrictions. ROV in 3 months with GT.

## 2014-02-27 ENCOUNTER — Encounter: Payer: Self-pay | Admitting: Internal Medicine

## 2014-02-27 ENCOUNTER — Encounter: Payer: Self-pay | Admitting: Vascular Surgery

## 2014-02-27 ENCOUNTER — Inpatient Hospital Stay (HOSPITAL_COMMUNITY): Admission: RE | Admit: 2014-02-27 | Payer: Self-pay | Source: Ambulatory Visit

## 2014-02-28 ENCOUNTER — Ambulatory Visit (INDEPENDENT_AMBULATORY_CARE_PROVIDER_SITE_OTHER): Payer: Commercial Managed Care - HMO | Admitting: Vascular Surgery

## 2014-02-28 ENCOUNTER — Encounter (HOSPITAL_COMMUNITY): Payer: Self-pay

## 2014-02-28 ENCOUNTER — Encounter: Payer: Self-pay | Admitting: Vascular Surgery

## 2014-02-28 ENCOUNTER — Ambulatory Visit (HOSPITAL_COMMUNITY): Admission: RE | Admit: 2014-02-28 | Payer: Medicare HMO | Source: Ambulatory Visit

## 2014-02-28 ENCOUNTER — Ambulatory Visit (HOSPITAL_COMMUNITY)
Admission: RE | Admit: 2014-02-28 | Discharge: 2014-02-28 | Disposition: A | Discharge status: Home / Self Care | Payer: Medicare HMO | Source: Ambulatory Visit | Attending: Internal Medicine | Admitting: Internal Medicine

## 2014-02-28 VITALS — BP 116/73 | HR 78 | Resp 18 | Wt 144.5 lb

## 2014-02-28 VITALS — BP 110/56 | HR 67 | Temp 97.6°F | Resp 20 | Ht 67.0 in | Wt 145.0 lb

## 2014-02-28 DIAGNOSIS — J449 Chronic obstructive pulmonary disease, unspecified: Secondary | ICD-10-CM | POA: Diagnosis not present

## 2014-02-28 DIAGNOSIS — Z87891 Personal history of nicotine dependence: Secondary | ICD-10-CM | POA: Diagnosis not present

## 2014-02-28 DIAGNOSIS — I2510 Atherosclerotic heart disease of native coronary artery without angina pectoris: Secondary | ICD-10-CM | POA: Diagnosis not present

## 2014-02-28 DIAGNOSIS — I4891 Unspecified atrial fibrillation: Secondary | ICD-10-CM | POA: Insufficient documentation

## 2014-02-28 DIAGNOSIS — Z7982 Long term (current) use of aspirin: Secondary | ICD-10-CM | POA: Insufficient documentation

## 2014-02-28 DIAGNOSIS — Z86718 Personal history of other venous thrombosis and embolism: Secondary | ICD-10-CM | POA: Diagnosis not present

## 2014-02-28 DIAGNOSIS — I739 Peripheral vascular disease, unspecified: Secondary | ICD-10-CM | POA: Diagnosis not present

## 2014-02-28 DIAGNOSIS — I5022 Chronic systolic (congestive) heart failure: Secondary | ICD-10-CM | POA: Diagnosis present

## 2014-02-28 DIAGNOSIS — Z79899 Other long term (current) drug therapy: Secondary | ICD-10-CM | POA: Insufficient documentation

## 2014-02-28 DIAGNOSIS — Z954 Presence of other heart-valve replacement: Secondary | ICD-10-CM | POA: Insufficient documentation

## 2014-02-28 DIAGNOSIS — F1010 Alcohol abuse, uncomplicated: Secondary | ICD-10-CM | POA: Insufficient documentation

## 2014-02-28 DIAGNOSIS — I509 Heart failure, unspecified: Secondary | ICD-10-CM | POA: Diagnosis not present

## 2014-02-28 DIAGNOSIS — I482 Chronic atrial fibrillation, unspecified: Secondary | ICD-10-CM

## 2014-02-28 DIAGNOSIS — J4489 Other specified chronic obstructive pulmonary disease: Secondary | ICD-10-CM | POA: Insufficient documentation

## 2014-02-28 LAB — BASIC METABOLIC PANEL
Anion gap: 11 (ref 5–15)
BUN: 19 mg/dL (ref 6–23)
CALCIUM: 9.4 mg/dL (ref 8.4–10.5)
CO2: 27 meq/L (ref 19–32)
Chloride: 103 mEq/L (ref 96–112)
Creatinine, Ser: 1 mg/dL (ref 0.50–1.35)
GFR calc Af Amer: 89 mL/min — ABNORMAL LOW (ref >90)
GFR calc non Af Amer: 76 mL/min — ABNORMAL LOW (ref >90)
GLUCOSE: 89 mg/dL (ref 70–99)
Potassium: 4.7 mEq/L (ref 3.7–5.3)
Sodium: 141 mEq/L (ref 137–147)

## 2014-02-28 LAB — PRO B NATRIURETIC PEPTIDE: Pro B Natriuretic peptide (BNP): 575.8 pg/mL — ABNORMAL HIGH (ref 0–125)

## 2014-02-28 IMAGING — CR DG CHEST 2V
2 series · 2 of 2 positions shown · non-contrast
Comparison: 11/09/2012

CLINICAL DATA: Cough and shortness of breath. History of heart
failure.

EXAM:
CHEST  2 VIEW

[w chest pa]
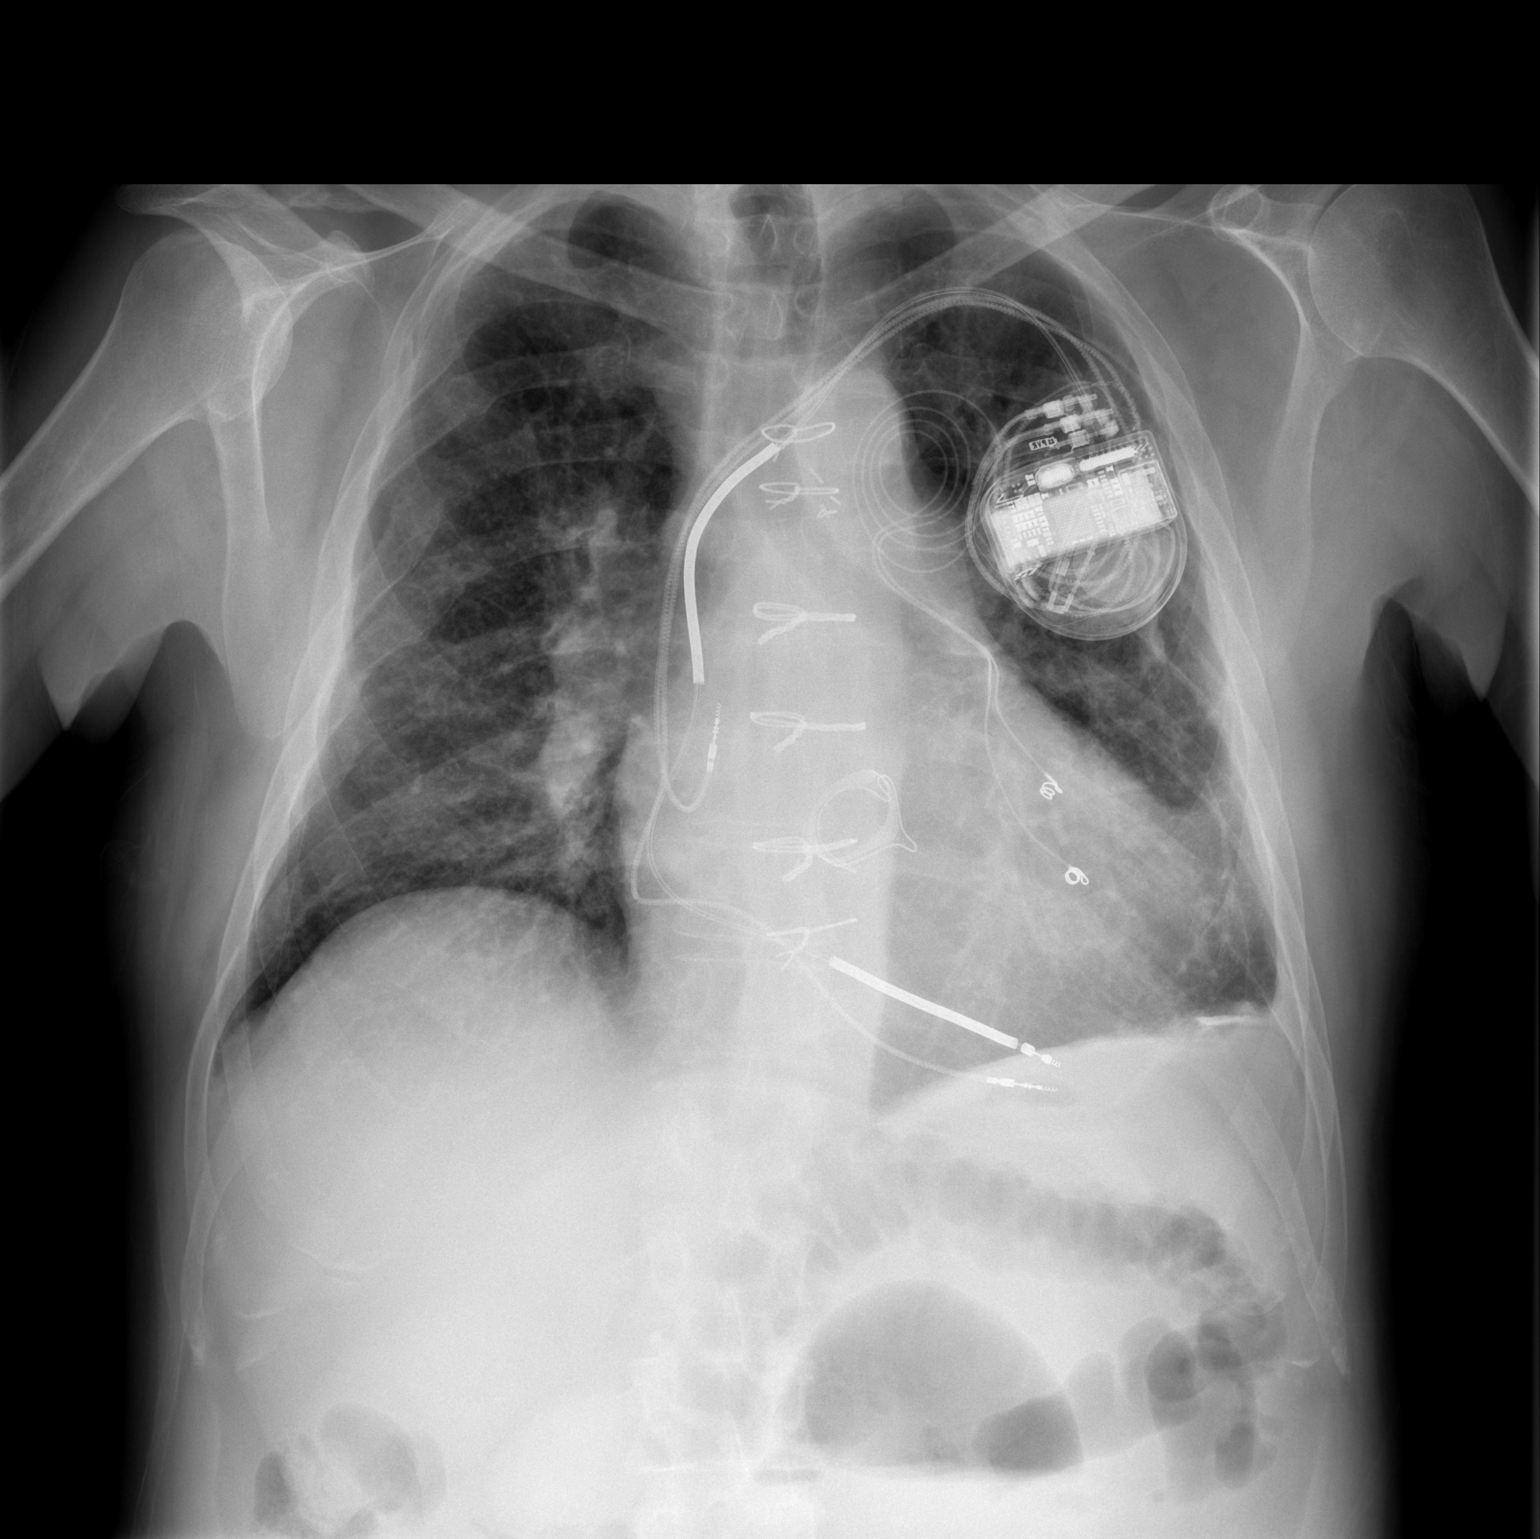

[w chest lat]
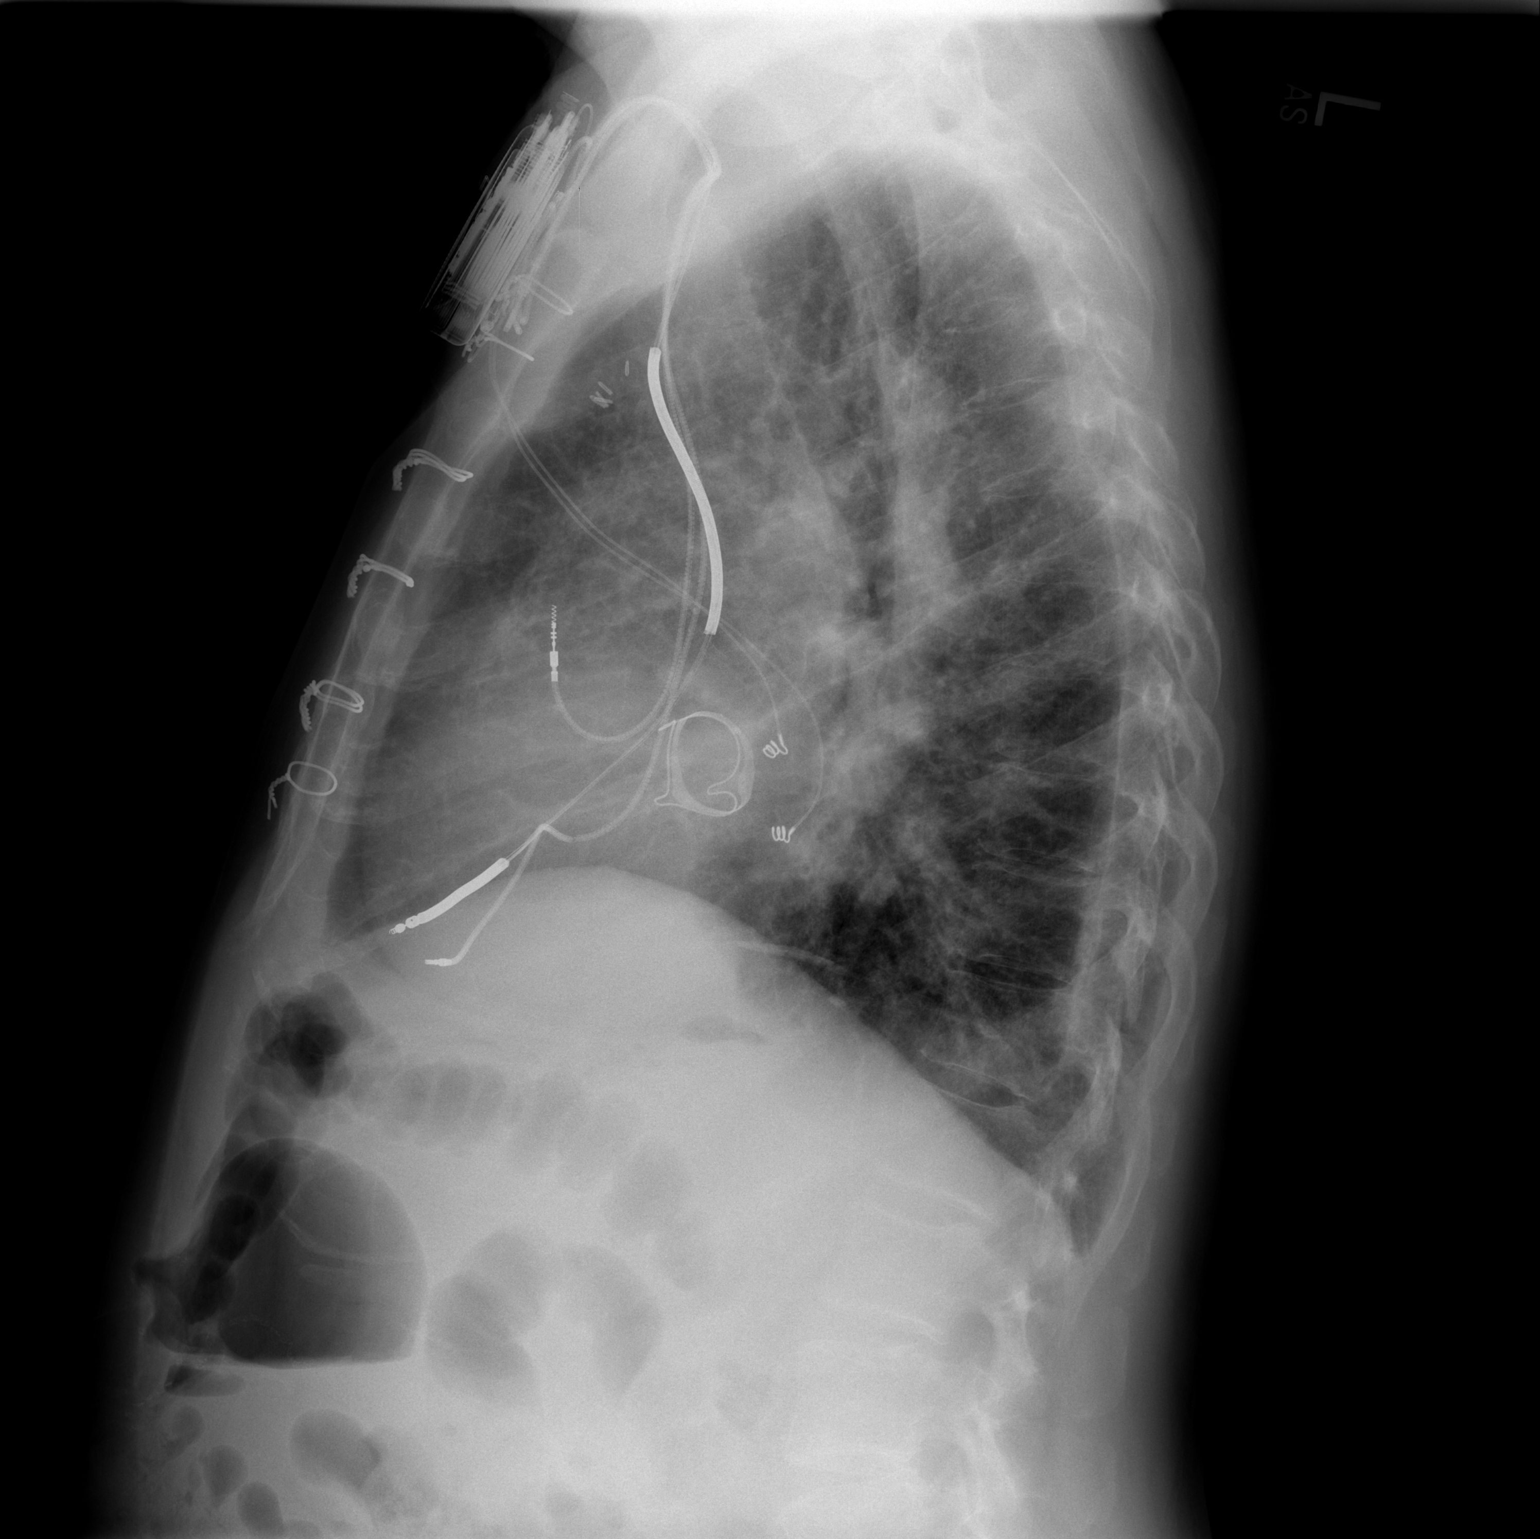

[2 of 2 positions shown; findings below may reference images not displayed]

FINDINGS: Two views of the chest demonstrate a left cardiac ICD. Patient has a
prosthetic heart valve. Chronic pleural calcifications at the left
lung base and difficult to exclude a small left pleural effusion.
Old right rib fractures. Coarse lung markings without focal airspace
disease or frank pulmonary edema. Old fracture of the lateral right
clavicle. Heart size is within normal limits and stable.
IMPRESSION: Chronic lung changes without evidence for acute disease.

Chronic pleural changes at the left lung base and difficult to
exclude a small left pleural effusion.

## 2014-02-28 MED ORDER — SPIRONOLACTONE 25 MG PO TABS: 12.5000 mg | ORAL_TABLET | Freq: Every day | ORAL | Status: DC

## 2014-02-28 NOTE — Assessment & Plan Note (Signed)
Based on his exam and Doppler study, he has evidence of infrainguinal arterial occlusive disease bilaterally. Although his ABIs are markedly diminished he really has minimal symptoms related to his peripheral vascular disease. However his activity is very limited. I have had a long discussion with him about the importance of tobacco cessation. He understands of his disease progresses this could become a limb threatening problem. I've encouraged him to stay as active as possible. We have discussed the importance of being a lot of vegetables and minimizing process food. He is on aspirin and is on a statin. I have ordered follow up ABIs in 6 months and I'll see him back at that time. He knows to call sooner if he has problems.

## 2014-02-28 NOTE — Progress Notes (Signed)
Patient ID: [name], male   DOB: 1946-05-09, 68 y.o.   MRN: [id number]  Reason for Consult: Peripheral vascular disease   Referred by Cain Saupe, MD  Subjective:     HPI:  [name] is a 68 y.o. male with a history of bilateral leg pain. He was sent for evaluation for peripheral vascular.   disease. He states that he has had pain in both feet for approximately a month. His symptoms came on gradually. The left foot hurts a little bit more than the right foot. I do not get any clear-cut history of thigh or calf claudication. The symptoms do not sound like rest pain in that elevation does not make his symptoms worse. He denies any history of nonhealing ulcers.  His risk factors for peripheral vascular disease include hypertension, hypercholesterolemia, and smoking. He had quit smoking in 2004 but began smoking again approximately 6 months ago. He smokes 1-1/2 packs per day according to his daughter.  He had a pacemaker placed approximately 3 weeks ago. He has undergone previous mitral valve replacement with a porcine valve as he was not a candidate for Coumadin given his drinking history.  I have reviewed his records from Dr. Debroah Baller office. He was seen there on 01/22/2014 with a complaint of burning in his left foot. He has multiple medical issues and also some problems with memory recently. He has chronic systolic congestive heart failure, coronary artery disease, and hypertension. I reviewed his labs from 01/22/2014 which shows a creatinine of 0.92.  Past Medical History  Diagnosis Date  . DVT (deep venous thrombosis)   . 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 COPD (chronic obstructive pulmonary disease)   . Depression   . Hypertension   . Dyslipidemia   . Ventral hernia   . CAD (coronary artery disease)   . Hernia   . Chronic systolic congestive heart failure   . Bipolar disorder   . Anxiety   .  Atrial fibrillation     persistent  . Alcohol abuse   . Complete heart block     s/p prior AV nodal ablation  . Stroke   . CVA (cerebral vascular accident)     multiple prior embolic strokes  . Pacemaker   . Pacemaker     PACEMAKER   Family History  Problem Relation Age of Onset  . Stroke    . Heart disease    . Alzheimer's disease Mother   . Heart attack Father   . Heart attack Son   . Varicose Veins Son   . Deep vein thrombosis Son    Past Surgical History  Procedure Laterality Date  . Cystoscopy w/ ureteral stent placement  07/12/2011    Procedure: CYSTOSCOPY WITH RETROGRADE PYELOGRAM/URETERAL STENT PLACEMENT;  Surgeon: Sebastian Ache, MD;  Location: [facility] OR;  Service: Urology;  Laterality: Left;  . Hernia repair    . Mitral valve replacement      remote [facility] valve 7356 with redo tissue valve 06/2006  . Pacemaker placement  2006    Changed to CRT-D in 2009  . Av node ablation  2007  . Icd generator change  02/01/2014    Gen change to: Medtronic VIVA pulse generator, serial number [id number]  . Cardiac catheterization  06/2006    Mild ostial L main stenosis, CFX stent patent, RCA occluded (old)  . Coronary artery bypass graft  2008  Short Social History:  History  Substance Use Topics  . Smoking status: Former Smoker -- 2.00 packs/day    Types: Cigarettes    Quit date: 01/30/2003  . Smokeless tobacco: Never Used  . Alcohol Use: 3.0 oz/week    6 drink(s) per week     Comment: occasionally   Allergies  Allergen Reactions  . Warfarin     Non compliance and ETOH abuse    Current Outpatient Prescriptions  Medication Sig Dispense Refill  . aspirin EC 325 MG EC tablet Take 1 tablet (325 mg total) by mouth daily.  30 tablet  0  . atorvastatin (LIPITOR) 20 MG tablet Take 1 tablet (20 mg total) by mouth daily.  30 tablet  3  . carvedilol (COREG) 6.25 MG tablet Take 6.25 mg by mouth 2 (two) times daily with a meal.       . furosemide (LASIX) 40 MG tablet Take 1  tab in AM and 1/2 tab in PM  45 tablet  3  . lisinopril (PRINIVIL,ZESTRIL) 5 MG tablet Take 1 tablet (5 mg total) by mouth daily.  30 tablet  3   No current facility-administered medications for this visit.   Review of Systems  Constitutional: Negative for chills and fever.  Eyes: Negative for loss of vision.  Respiratory: Negative for cough and wheezing.  Cardiovascular: Negative for chest pain, chest tightness, claudication, dyspnea with exertion, orthopnea and palpitations.  GI: Negative for blood in stool and vomiting.  GU: Negative for dysuria and hematuria.  Musculoskeletal: Negative for leg pain, joint pain and myalgias.  Skin: Negative for rash and wound.  Neurological: Negative for dizziness and speech difficulty.  Hematologic: Negative for bruises/bleeds easily. Psychiatric: Negative for depressed mood.       Objective:  Objective  Filed Vitals:   02/28/14 0923  BP: 110/56  Pulse: 67  Temp: 97.6 F (36.4 C)  TempSrc: Oral  Resp: 20  Height:  (1.702 m)  Weight: 145 lb (65.772 kg)  SpO2: 96%   Body mass index is 22.71 kg/(m^2).  Physical Exam  Constitutional: He is oriented to person, place, and time. He appears well-developed and well-nourished.  HENT:  Head: Normocephalic and atraumatic.  Neck: Neck supple. No JVD present. No thyromegaly present.  Cardiovascular: Normal rate, regular rhythm and normal heart sounds.  Exam reveals no friction rub.   No murmur heard. Pulses:      Radial pulses are 2+ on the right side, and 2+ on the left side.       Femoral pulses are 2+ on the right side, and 2+ on the left side.      Popliteal pulses are 2+ on the right side, and 1+ on the left side.       Dorsalis pedis pulses are 0 on the right side, and 0 on the left side.       Posterior tibial pulses are 0 on the right side, and 0 on the left side.  Pulmonary/Chest: Breath sounds normal. He has no wheezes. He has no rales.  Abdominal: Soft. Bowel sounds are normal.  There is no tenderness.  I do not palpate an aneurysm.  Musculoskeletal: Normal range of motion. He exhibits no edema.  Lymphadenopathy:    He has no cervical adenopathy.  Neurological: He is alert and oriented to person, place, and time. He has normal strength. No sensory deficit.  Skin: No lesion and no rash noted.  Psychiatric: He has a normal mood and affect.  Data: ARTERIAL DOPPLER STUDY: I have independently interpreted his arterial Doppler study which shows monophasic Doppler signals in the dorsalis pedis and posterior tibial positions bilaterally. ABI on the right is 57%. ABI on the left is 51%. This study was dated 02/08/2014.      Assessment/Plan:     PVD (peripheral vascular disease) Based on his exam and Doppler study, he has evidence of infrainguinal arterial occlusive disease bilaterally. Although his ABIs are markedly diminished he really has minimal symptoms related to his peripheral vascular disease. However his activity is very limited. I have had a long discussion with him about the importance of tobacco cessation. He understands of his disease progresses this could become a limb threatening problem. I've encouraged him to stay as active as possible. We have discussed the importance of being a lot of vegetables and minimizing process food. He is on aspirin and is on a statin. I have ordered follow up ABIs in 6 months and I'll see him back at that time. He knows to call sooner if he has problems.   Chuck Hint MD [facility]

## 2014-02-28 NOTE — Addendum Note (Signed)
Addended by: Corwin Kuiken K on: 02/28/2014 04:36 PM   Modules accepted: Orders  

## 2014-02-28 NOTE — Patient Instructions (Signed)
Doing well, continue to the good work.  Start spironolactone 12.5 mg (1/2 tablet) daily. You can take it in the evening, call any dizziness.  Follow up in 2 weeks with labs. October 6th anytime between 8:15 and 4pm  Follow up in 1 month  Do the following things EVERYDAY: 1) Weigh yourself in the morning before breakfast. Write it down and keep it in a log. 2) Take your medicines as prescribed 3) Eat low salt foods-Limit salt (sodium) to 2000 mg per day.  4) Stay as active as you can everyday 5) Limit all fluids for the day to less than 2 liters 6)
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
## 2014-03-13 ENCOUNTER — Ambulatory Visit (HOSPITAL_COMMUNITY)
Admission: RE | Admit: 2014-03-13 | Discharge: 2014-03-13 | Disposition: A | Discharge status: Home / Self Care | Payer: Medicare HMO | Source: Ambulatory Visit | Attending: Internal Medicine | Admitting: Internal Medicine

## 2014-03-13 DIAGNOSIS — I5022 Chronic systolic (congestive) heart failure: Secondary | ICD-10-CM | POA: Diagnosis not present

## 2014-03-13 LAB — BASIC METABOLIC PANEL
ANION GAP: 9 (ref 5–15)
BUN: 13 mg/dL (ref 6–23)
CHLORIDE: 101 meq/L (ref 96–112)
CO2: 31 meq/L (ref 19–32)
CREATININE: 1.07 mg/dL (ref 0.50–1.35)
Calcium: 9.3 mg/dL (ref 8.4–10.5)
GFR calc Af Amer: 82 mL/min — ABNORMAL LOW (ref >90)
GFR calc non Af Amer: 70 mL/min — ABNORMAL LOW (ref >90)
Glucose, Bld: 86 mg/dL (ref 70–99)
Potassium: 4.8 mEq/L (ref 3.7–5.3)
SODIUM: 141 meq/L (ref 137–147)

## 2014-03-19 ENCOUNTER — Other Ambulatory Visit: Payer: Self-pay | Admitting: Family

## 2014-03-19 ENCOUNTER — Ambulatory Visit
Admission: RE | Admit: 2014-03-19 | Discharge: 2014-03-19 | Disposition: A | Discharge status: Home / Self Care | Payer: Commercial Managed Care - HMO | Source: Ambulatory Visit | Attending: Family | Admitting: Family

## 2014-03-19 DIAGNOSIS — R059 Cough, unspecified: Secondary | ICD-10-CM

## 2014-03-19 DIAGNOSIS — I5022 Chronic systolic (congestive) heart failure: Secondary | ICD-10-CM

## 2014-03-19 DIAGNOSIS — R0689 Other abnormalities of breathing: Secondary | ICD-10-CM

## 2014-03-19 DIAGNOSIS — R05 Cough: Secondary | ICD-10-CM

## 2014-03-19 DIAGNOSIS — F17200 Nicotine dependence, unspecified, uncomplicated: Secondary | ICD-10-CM

## 2014-03-30 ENCOUNTER — Ambulatory Visit (HOSPITAL_COMMUNITY)
Admission: RE | Admit: 2014-03-30 | Discharge: 2014-03-30 | Disposition: A | Discharge status: Home / Self Care | Payer: Commercial Managed Care - HMO | Source: Ambulatory Visit | Attending: Internal Medicine | Admitting: Internal Medicine

## 2014-03-30 ENCOUNTER — Encounter (HOSPITAL_COMMUNITY): Payer: Self-pay

## 2014-03-30 VITALS — BP 93/63 | HR 71 | Resp 18 | Wt 149.2 lb

## 2014-03-30 DIAGNOSIS — Z86718 Personal history of other venous thrombosis and embolism: Secondary | ICD-10-CM | POA: Insufficient documentation

## 2014-03-30 DIAGNOSIS — E785 Hyperlipidemia, unspecified: Secondary | ICD-10-CM | POA: Diagnosis not present

## 2014-03-30 DIAGNOSIS — Z7982 Long term (current) use of aspirin: Secondary | ICD-10-CM | POA: Insufficient documentation

## 2014-03-30 DIAGNOSIS — I2510 Atherosclerotic heart disease of native coronary artery without angina pectoris: Secondary | ICD-10-CM

## 2014-03-30 DIAGNOSIS — Z79899 Other long term (current) drug therapy: Secondary | ICD-10-CM | POA: Diagnosis not present

## 2014-03-30 DIAGNOSIS — I482 Chronic atrial fibrillation, unspecified: Secondary | ICD-10-CM

## 2014-03-30 DIAGNOSIS — I739 Peripheral vascular disease, unspecified: Secondary | ICD-10-CM | POA: Insufficient documentation

## 2014-03-30 DIAGNOSIS — Z952 Presence of prosthetic heart valve: Secondary | ICD-10-CM | POA: Diagnosis not present

## 2014-03-30 DIAGNOSIS — I5022 Chronic systolic (congestive) heart failure: Secondary | ICD-10-CM

## 2014-03-30 DIAGNOSIS — I255 Ischemic cardiomyopathy: Secondary | ICD-10-CM | POA: Insufficient documentation

## 2014-03-30 DIAGNOSIS — Z95810 Presence of automatic (implantable) cardiac defibrillator: Secondary | ICD-10-CM | POA: Insufficient documentation

## 2014-03-30 DIAGNOSIS — Z87891 Personal history of nicotine dependence: Secondary | ICD-10-CM | POA: Diagnosis not present

## 2014-03-30 DIAGNOSIS — Z8673 Personal history of transient ischemic attack (TIA), and cerebral infarction without residual deficits: Secondary | ICD-10-CM | POA: Insufficient documentation

## 2014-03-30 DIAGNOSIS — I2583 Coronary atherosclerosis due to lipid rich plaque: Secondary | ICD-10-CM

## 2014-03-30 DIAGNOSIS — I10 Essential (primary) hypertension: Secondary | ICD-10-CM | POA: Diagnosis not present

## 2014-03-30 DIAGNOSIS — F1010 Alcohol abuse, uncomplicated: Secondary | ICD-10-CM | POA: Insufficient documentation

## 2014-03-30 DIAGNOSIS — I2582 Chronic total occlusion of coronary artery: Secondary | ICD-10-CM | POA: Insufficient documentation

## 2014-03-30 LAB — CBC
HCT: 40 % (ref 39.0–52.0)
Hemoglobin: 13.4 g/dL (ref 13.0–17.0)
MCH: 29.8 pg (ref 26.0–34.0)
MCHC: 33.5 g/dL (ref 30.0–36.0)
MCV: 88.9 fL (ref 78.0–100.0)
PLATELETS: 166 10*3/uL (ref 150–400)
RBC: 4.5 MIL/uL (ref 4.22–5.81)
RDW: 14.2 % (ref 11.5–15.5)
WBC: 9.1 10*3/uL (ref 4.0–10.5)

## 2014-03-30 MED ORDER — ASPIRIN EC 81 MG PO TBEC: 81.0000 mg | DELAYED_RELEASE_TABLET | Freq: Every day | ORAL | Status: DC

## 2014-03-30 MED ORDER — APIXABAN 5 MG PO TABS: 5.0000 mg | ORAL_TABLET | Freq: Two times a day (BID) | ORAL | Status: DC

## 2014-03-30 NOTE — Patient Instructions (Signed)
Doing great.  Will start Eliquis 5 mg (1 tablet) twice a day.  Decrease your Aspirin to 81 mg daily.  Call any issues.  Follow up in 1 month  Do the following things EVERYDAY: 1) Weigh yourself in the morning before breakfast. Write it down and keep it in a log. 2) Take your medicines as prescribed 3) Eat low salt foods-Limit salt (sodium) to 2000 mg per day.  4) Stay as active as you can everyday 5) Limit all fluids for the day to less than 2 liters 6)
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
## 2014-04-04 ENCOUNTER — Telehealth (HOSPITAL_COMMUNITY): Payer: Self-pay | Admitting: Anesthesiology

## 2014-04-04 NOTE — Telephone Encounter (Signed)
Called to discontinue ASA since he is on Eliquis.   Ulla Potash B NP-C 4:41 PM

## 2014-04-05 ENCOUNTER — Telehealth (HOSPITAL_COMMUNITY): Payer: Self-pay

## 2014-04-05 NOTE — Telephone Encounter (Signed)
Patient called c/o chest pain, sob.  Advised to go to emergency department and be seen as soon as possible.  Have either wife drive him or call EMS. [address]

## 2014-04-26 IMAGING — CT CT HEAD W/O CM
2 series · 16 of 30 positions shown, 20 images · non-contrast
Comparison: CT HEAD W/O CM dated 11/08/2012; CT C SPINE W/O CM dated
11/08/2012

CLINICAL DATA: Headaches.  History of stroke.

EXAM:
CT HEAD WITHOUT CONTRAST
TECHNIQUE: Contiguous axial images were obtained from the base of the skull
through the vertex without intravenous contrast.

[Series 2: head w/o · axial · non-contrast · 0.48mm/px · z∈[+1380,+1496]mm · 13 of 27 slices shown, 17 images]
[im 2/27  brain]
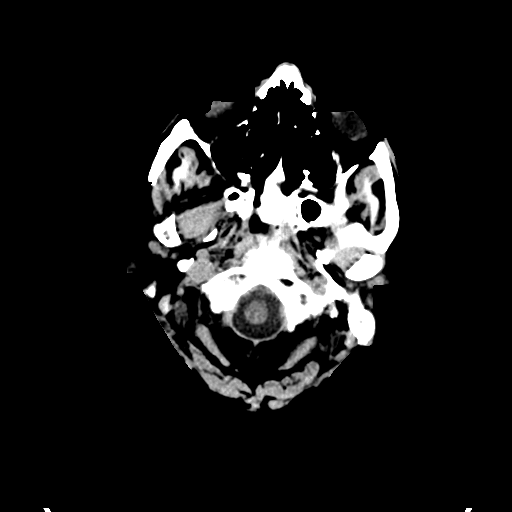
[im 2/27  bone]
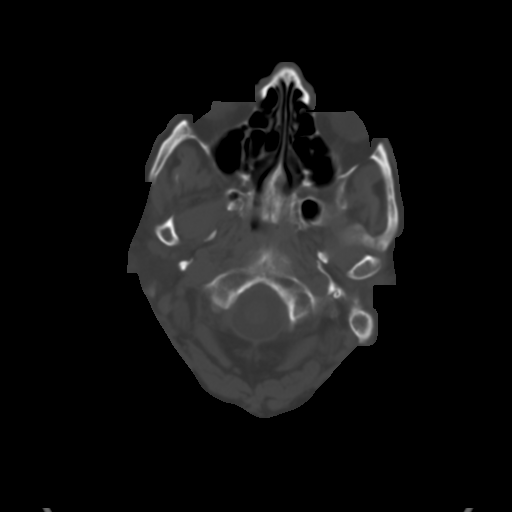
[im 4/27  brain]
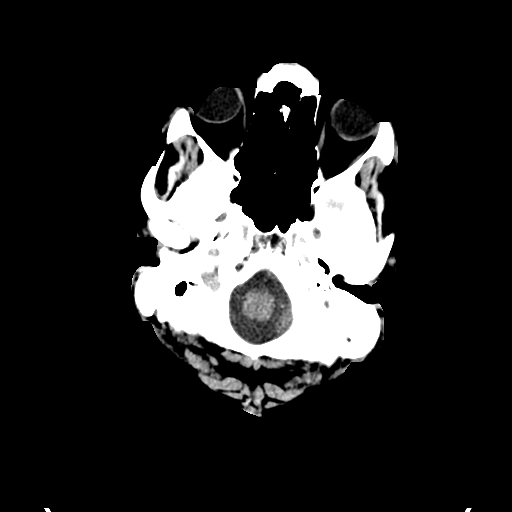
[im 6/27  brain]
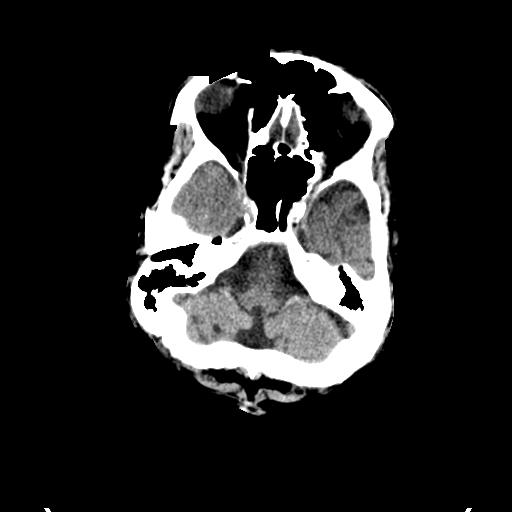
[im 8/27  brain]
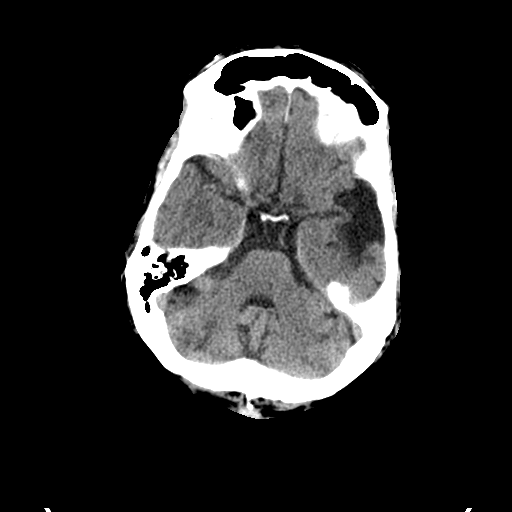
[im 10/27  brain]
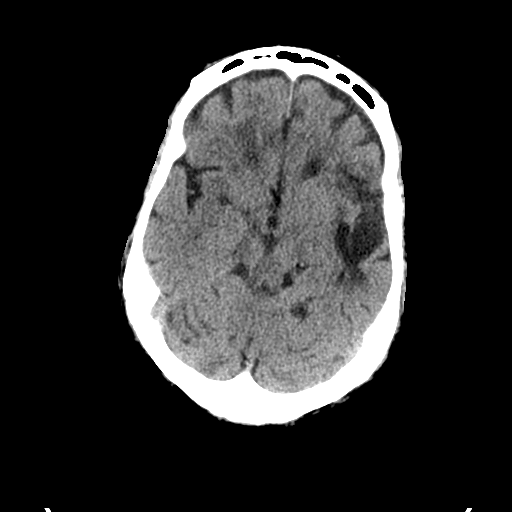
[im 10/27  bone]
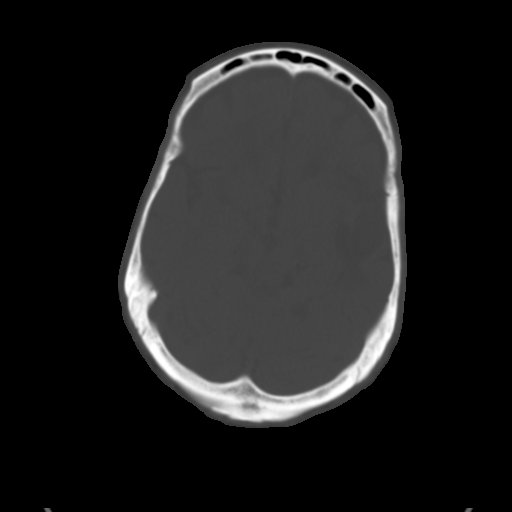
[im 12/27  brain]
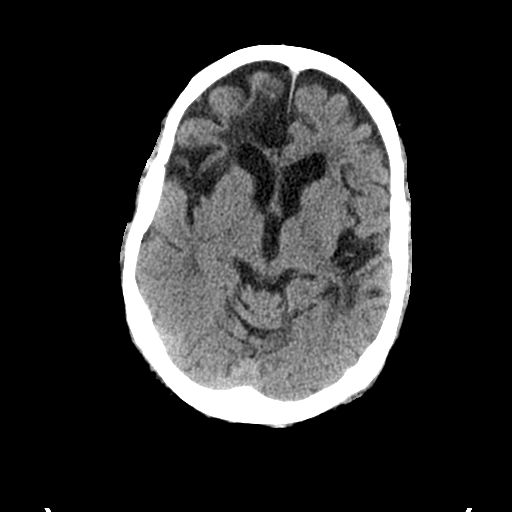
[im 14/27  brain]
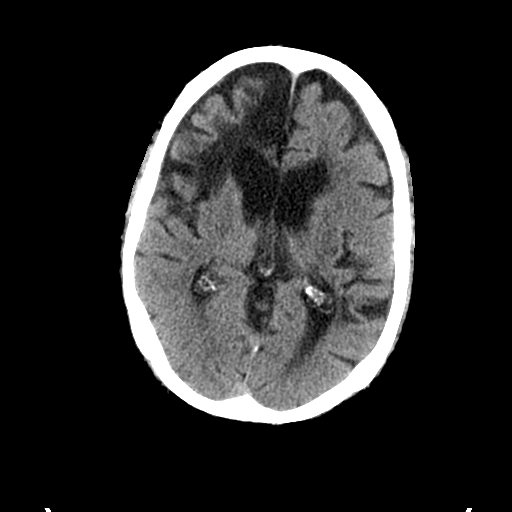
[im 15/27  brain]
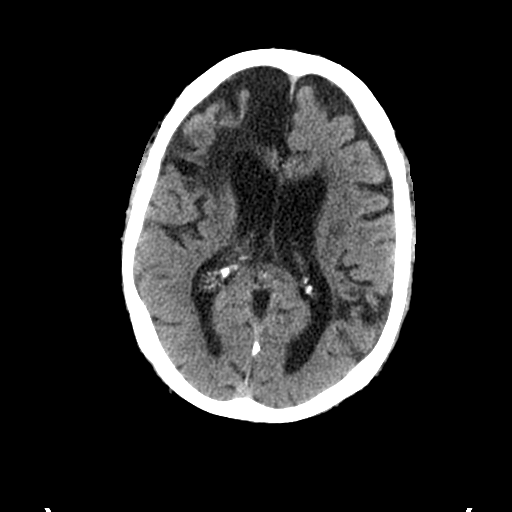
[im 17/27  brain]
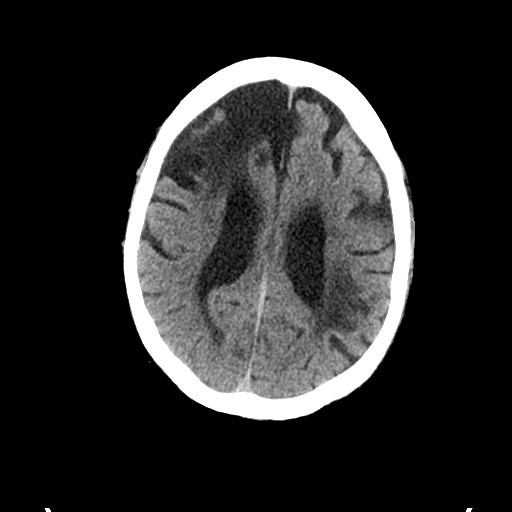
[im 17/27  bone]
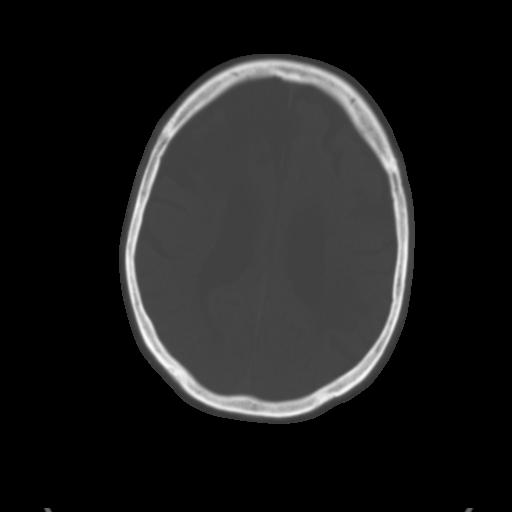
[im 19/27  brain]
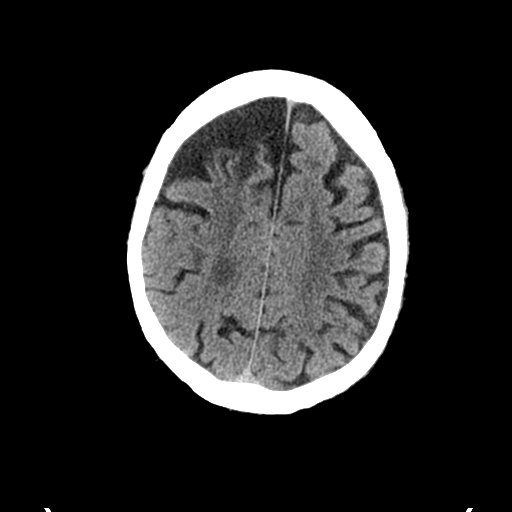
[im 21/27  brain]
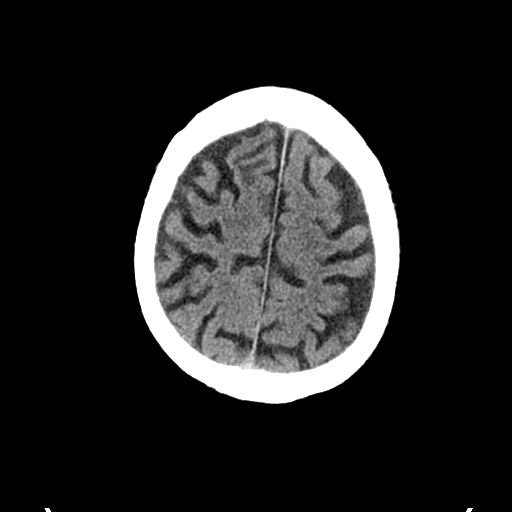
[im 23/27  brain]
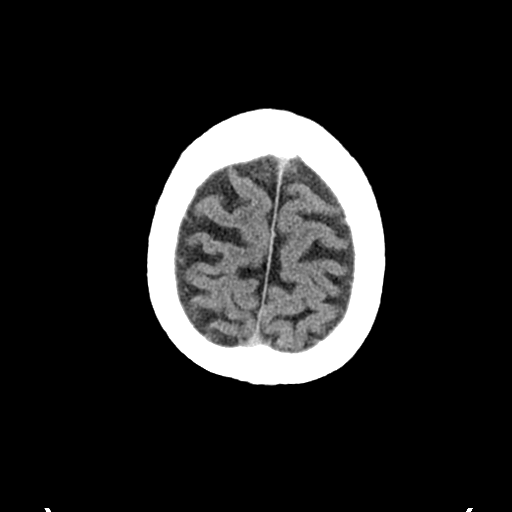
[im 25/27  brain]
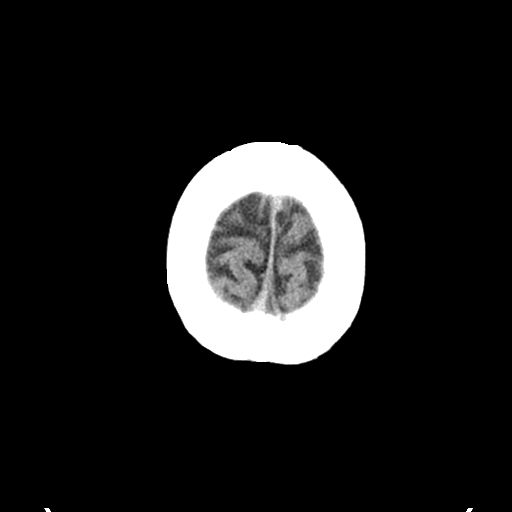
[im 25/27  bone]
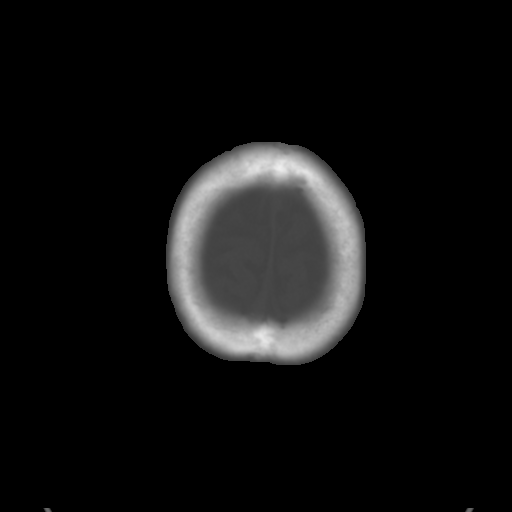

[Series 3: bone windows · axial · 0.48mm/px · z∈[+1380,+1420]mm · 3 of 27 slices shown]
[im 2/27  bone]
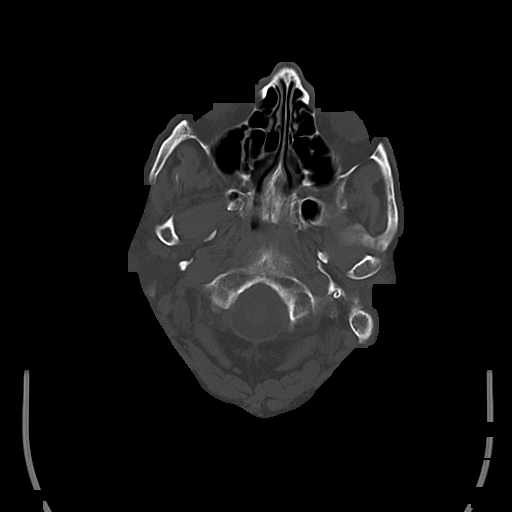
[im 6/27  bone]
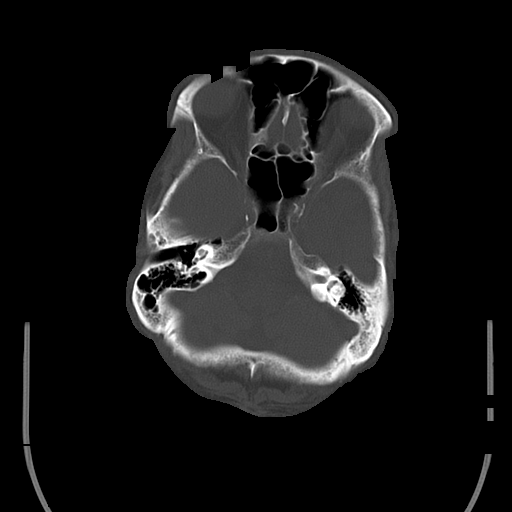
[im 10/27  bone]
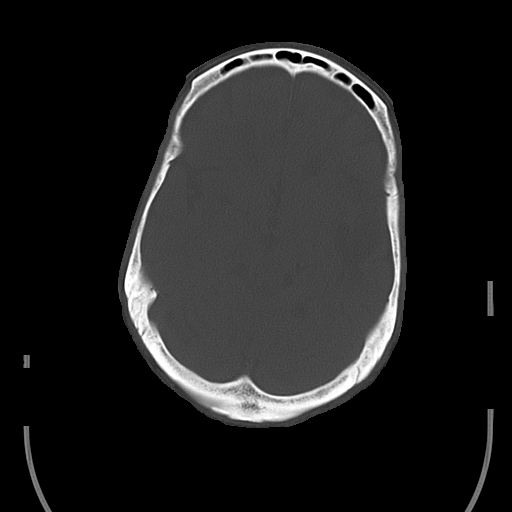

[16 of 30 positions shown; findings below may reference images not displayed]

FINDINGS: Multiple old infarcts are again noted with extensive
encephalomalacia in the right frontal lobe and adjacent to the left
sylvian fissure. There is generalized atrophy and patchy
periventricular white matter disease. No acute intracranial
hemorrhage, mass lesion, brain edema or extra-axial fluid collection
is identified. There is no evidence of acute stroke.

The visualized paranasal sinuses, mastoid air cells and middle ears
are clear. The calvarium is intact.
IMPRESSION: Stable chronic findings with multiple old infarcts and generalized
atrophy. No acute intracranial findings.

## 2014-04-30 ENCOUNTER — Encounter (HOSPITAL_COMMUNITY): Payer: Commercial Managed Care - HMO

## 2014-05-09 ENCOUNTER — Encounter (HOSPITAL_COMMUNITY): Payer: Commercial Managed Care - HMO

## 2014-05-10 ENCOUNTER — Other Ambulatory Visit (HOSPITAL_COMMUNITY): Payer: Self-pay | Admitting: Cardiology

## 2014-05-10 DIAGNOSIS — I5022 Chronic systolic (congestive) heart failure: Secondary | ICD-10-CM

## 2014-05-10 MED ORDER — SPIRONOLACTONE 25 MG PO TABS: 25.0000 mg | ORAL_TABLET | Freq: Every day | ORAL | Status: DC

## 2014-05-14 ENCOUNTER — Telehealth: Payer: Self-pay | Admitting: Internal Medicine

## 2014-05-14 NOTE — Telephone Encounter (Signed)
New Message         Pt's daughter calling stating that she needs to talk to Dr. Ladona Ridgel about pt's care and him not taking care of himself properly. Pt's daughter feels that she needs to get a POA and get him into a facility but wants to speak with someone about how he has been doing. Please call back and advise.
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
## 2014-05-14 NOTE — Telephone Encounter (Signed)
Will forward to [facility] and Dr Graciela Husbands

## 2014-05-15 ENCOUNTER — Encounter: Payer: Self-pay | Admitting: Internal Medicine

## 2014-05-17 ENCOUNTER — Encounter (HOSPITAL_COMMUNITY): Payer: Self-pay | Admitting: Internal Medicine

## 2014-05-30 ENCOUNTER — Other Ambulatory Visit (HOSPITAL_COMMUNITY): Payer: Self-pay | Admitting: Cardiology

## 2014-06-27 ENCOUNTER — Encounter (HOSPITAL_COMMUNITY): Payer: Self-pay | Admitting: Emergency Medicine

## 2014-06-27 ENCOUNTER — Emergency Department (HOSPITAL_COMMUNITY)
Admission: EM | Admit: 2014-06-27 | Discharge: 2014-06-27 | Disposition: A | Discharge status: Home / Self Care | Payer: Commercial Managed Care - HMO | Attending: Emergency Medicine | Admitting: Emergency Medicine

## 2014-06-27 DIAGNOSIS — Z8673 Personal history of transient ischemic attack (TIA), and cerebral infarction without residual deficits: Secondary | ICD-10-CM | POA: Diagnosis not present

## 2014-06-27 DIAGNOSIS — J449 Chronic obstructive pulmonary disease, unspecified: Secondary | ICD-10-CM | POA: Insufficient documentation

## 2014-06-27 DIAGNOSIS — Z79899 Other long term (current) drug therapy: Secondary | ICD-10-CM | POA: Diagnosis not present

## 2014-06-27 DIAGNOSIS — I5022 Chronic systolic (congestive) heart failure: Secondary | ICD-10-CM | POA: Insufficient documentation

## 2014-06-27 DIAGNOSIS — I10 Essential (primary) hypertension: Secondary | ICD-10-CM | POA: Insufficient documentation

## 2014-06-27 DIAGNOSIS — E785 Hyperlipidemia, unspecified: Secondary | ICD-10-CM | POA: Insufficient documentation

## 2014-06-27 DIAGNOSIS — T82198A Other mechanical complication of other cardiac electronic device, initial encounter: Secondary | ICD-10-CM | POA: Diagnosis not present

## 2014-06-27 DIAGNOSIS — Z9889 Other specified postprocedural states: Secondary | ICD-10-CM | POA: Insufficient documentation

## 2014-06-27 DIAGNOSIS — Z8659 Personal history of other mental and behavioral disorders: Secondary | ICD-10-CM | POA: Insufficient documentation

## 2014-06-27 DIAGNOSIS — Z8719 Personal history of other diseases of the digestive system: Secondary | ICD-10-CM | POA: Diagnosis not present

## 2014-06-27 DIAGNOSIS — Z87891 Personal history of nicotine dependence: Secondary | ICD-10-CM | POA: Diagnosis not present

## 2014-06-27 DIAGNOSIS — Y848 Other medical procedures as the cause of abnormal reaction of the patient, or of later complication, without mention of misadventure at the time of the procedure: Secondary | ICD-10-CM | POA: Insufficient documentation

## 2014-06-27 DIAGNOSIS — I4891 Unspecified atrial fibrillation: Secondary | ICD-10-CM | POA: Insufficient documentation

## 2014-06-27 DIAGNOSIS — Z7902 Long term (current) use of antithrombotics/antiplatelets: Secondary | ICD-10-CM | POA: Insufficient documentation

## 2014-06-27 DIAGNOSIS — I2510 Atherosclerotic heart disease of native coronary artery without angina pectoris: Secondary | ICD-10-CM | POA: Diagnosis not present

## 2014-06-27 DIAGNOSIS — Z950 Presence of cardiac pacemaker: Secondary | ICD-10-CM

## 2014-06-27 DIAGNOSIS — Z86718 Personal history of other venous thrombosis and embolism: Secondary | ICD-10-CM | POA: Diagnosis not present

## 2014-06-27 DIAGNOSIS — Z45018 Encounter for adjustment and management of other part of cardiac pacemaker: Secondary | ICD-10-CM | POA: Diagnosis present

## 2014-06-27 NOTE — ED Notes (Signed)
Gave pt [place] sandwich and drink with MD Juleen China approval.

## 2014-06-27 NOTE — ED Notes (Signed)
Pt pacemaker interrogated.  

## 2014-06-27 NOTE — ED Notes (Signed)
Charge RN Janett Billow at bedside interrogating pt's pacemaker.

## 2014-06-27 NOTE — ED Notes (Signed)
[employer] rep at bedside

## 2014-06-27 NOTE — ED Notes (Signed)
Pt called out stating his pacemaker was beeping. MD Juleen China made aware.

## 2014-06-27 NOTE — ED Notes (Signed)
[employer] rep on the way in to interrogate

## 2014-06-27 NOTE — ED Notes (Signed)
Pt from [facility] jail, accompanied by police. Per pt, pacemaker/defibrillator has "been making sounds x 1 month." Pt denies pain. Pt SOB/dizziness. Denies n/v. Pt does not know what brand is pacemaker is. Pt restrained by policeman. NAD noted. VSS.

## 2014-06-27 NOTE — ED Notes (Signed)
MD Kohut at bedside. 
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
## 2014-07-10 ENCOUNTER — Encounter: Payer: Commercial Managed Care - HMO | Admitting: Internal Medicine

## 2014-07-31 ENCOUNTER — Encounter: Payer: Self-pay | Admitting: Internal Medicine

## 2014-07-31 ENCOUNTER — Ambulatory Visit (INDEPENDENT_AMBULATORY_CARE_PROVIDER_SITE_OTHER): Payer: Commercial Managed Care - HMO | Admitting: Internal Medicine

## 2014-07-31 VITALS — BP 120/74 | HR 79 | Ht 67.0 in | Wt 150.0 lb

## 2014-07-31 DIAGNOSIS — Z95810 Presence of automatic (implantable) cardiac defibrillator: Secondary | ICD-10-CM

## 2014-07-31 DIAGNOSIS — I442 Atrioventricular block, complete: Secondary | ICD-10-CM

## 2014-07-31 DIAGNOSIS — I481 Persistent atrial fibrillation: Secondary | ICD-10-CM

## 2014-07-31 DIAGNOSIS — I5022 Chronic systolic (congestive) heart failure: Secondary | ICD-10-CM

## 2014-07-31 DIAGNOSIS — I472 Ventricular tachycardia, unspecified: Secondary | ICD-10-CM

## 2014-07-31 DIAGNOSIS — I4819 Other persistent atrial fibrillation: Secondary | ICD-10-CM

## 2014-07-31 LAB — MDC_IDC_ENUM_SESS_TYPE_INCLINIC
Battery Remaining Longevity: 86 mo
Battery Voltage: 3.01 V
Brady Statistic AP VP Percent: 2.33 %
Date Time Interrogation Session: 20160223140632
HIGH POWER IMPEDANCE MEASURED VALUE: 171 Ohm
HIGH POWER IMPEDANCE MEASURED VALUE: 43 Ohm
HighPow Impedance: 51 Ohm
Lead Channel Impedance Value: 1102 Ohm
Lead Channel Impedance Value: 4047 Ohm
Lead Channel Impedance Value: 532 Ohm
Lead Channel Pacing Threshold Amplitude: 0.5 V
Lead Channel Pacing Threshold Amplitude: 0.5 V
Lead Channel Pacing Threshold Amplitude: 0.5 V
Lead Channel Pacing Threshold Pulse Width: 0.4 ms
Lead Channel Pacing Threshold Pulse Width: 0.4 ms
Lead Channel Pacing Threshold Pulse Width: 0.5 ms
Lead Channel Sensing Intrinsic Amplitude: 1.625 mV
Lead Channel Sensing Intrinsic Amplitude: 21 mV
Lead Channel Setting Pacing Amplitude: 2.5 V
Lead Channel Setting Pacing Pulse Width: 0.4 ms
Lead Channel Setting Pacing Pulse Width: 0.4 ms
Lead Channel Setting Sensing Sensitivity: 0.45 mV
MDC IDC MSMT LEADCHNL LV IMPEDANCE VALUE: 399 Ohm
MDC IDC MSMT LEADCHNL LV IMPEDANCE VALUE: 4047 Ohm
MDC IDC SET LEADCHNL LV PACING AMPLITUDE: 2.75 V
MDC IDC SET LEADCHNL RA PACING AMPLITUDE: 1.5 V
MDC IDC SET ZONE DETECTION INTERVAL: 300 ms
MDC IDC SET ZONE DETECTION INTERVAL: 350 ms
MDC IDC STAT BRADY AP VS PERCENT: 0 %
MDC IDC STAT BRADY AS VP PERCENT: 95.37 %
MDC IDC STAT BRADY AS VS PERCENT: 2.3 %
MDC IDC STAT BRADY RA PERCENT PACED: 2.33 %
MDC IDC STAT BRADY RV PERCENT PACED: 96.55 %
Zone Setting Detection Interval: 270 ms
Zone Setting Detection Interval: 350 ms
Zone Setting Detection Interval: 400 ms

## 2014-07-31 NOTE — Assessment & Plan Note (Signed)
He has had no recurrent ventricular arrhythmias and no ICD therapies. No change in medication.

## 2014-07-31 NOTE — Assessment & Plan Note (Signed)
His Medtronic ICD is working normally. We'll plan to recheck in several months. 

## 2014-07-31 NOTE — Assessment & Plan Note (Signed)
He is in atrial fibrillation almost 100% of the time. His ventricular rate is well controlled. He is asymptomatic.

## 2014-07-31 NOTE — Assessment & Plan Note (Signed)
His symptoms are class II and well compensated. No change in medical therapy.

## 2014-07-31 NOTE — Patient Instructions (Signed)
Your physician recommends that you schedule a follow-up appointment in: 3 months in the device clinic  Your physician wants you to follow-up in: 12 months with Dr Court Joy will receive a reminder letter in the mail two months in advance. If you don't receive a letter, please call our office to schedule the follow-up appointment.
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
## 2014-07-31 NOTE — Assessment & Plan Note (Signed)
ICD interrogation demonstrates no ventricular tachycardia. He will continue his current medical therapy.

## 2014-08-15 ENCOUNTER — Other Ambulatory Visit (HOSPITAL_COMMUNITY): Payer: Self-pay | Admitting: Cardiology

## 2014-08-19 ENCOUNTER — Other Ambulatory Visit (HOSPITAL_COMMUNITY): Payer: Self-pay

## 2014-08-19 ENCOUNTER — Inpatient Hospital Stay (HOSPITAL_COMMUNITY)
Admission: EM | Admit: 2014-08-19 | Discharge: 2014-08-23 | DRG: 287 | Disposition: A | Discharge status: Home / Self Care | Payer: Commercial Managed Care - HMO | Attending: Internal Medicine | Admitting: Internal Medicine

## 2014-08-19 ENCOUNTER — Encounter (HOSPITAL_COMMUNITY): Payer: Self-pay | Admitting: *Deleted

## 2014-08-19 ENCOUNTER — Emergency Department (HOSPITAL_COMMUNITY): Payer: Commercial Managed Care - HMO

## 2014-08-19 DIAGNOSIS — F1010 Alcohol abuse, uncomplicated: Secondary | ICD-10-CM | POA: Diagnosis present

## 2014-08-19 DIAGNOSIS — I5023 Acute on chronic systolic (congestive) heart failure: Secondary | ICD-10-CM | POA: Diagnosis present

## 2014-08-19 DIAGNOSIS — Z9114 Patient's other noncompliance with medication regimen: Secondary | ICD-10-CM | POA: Diagnosis present

## 2014-08-19 DIAGNOSIS — Z7982 Long term (current) use of aspirin: Secondary | ICD-10-CM

## 2014-08-19 DIAGNOSIS — Z8673 Personal history of transient ischemic attack (TIA), and cerebral infarction without residual deficits: Secondary | ICD-10-CM

## 2014-08-19 DIAGNOSIS — I200 Unstable angina: Secondary | ICD-10-CM | POA: Insufficient documentation

## 2014-08-19 DIAGNOSIS — F419 Anxiety disorder, unspecified: Secondary | ICD-10-CM | POA: Diagnosis present

## 2014-08-19 DIAGNOSIS — I5022 Chronic systolic (congestive) heart failure: Secondary | ICD-10-CM | POA: Diagnosis present

## 2014-08-19 DIAGNOSIS — F319 Bipolar disorder, unspecified: Secondary | ICD-10-CM | POA: Diagnosis present

## 2014-08-19 DIAGNOSIS — Z87891 Personal history of nicotine dependence: Secondary | ICD-10-CM

## 2014-08-19 DIAGNOSIS — I442 Atrioventricular block, complete: Secondary | ICD-10-CM | POA: Diagnosis present

## 2014-08-19 DIAGNOSIS — Z951 Presence of aortocoronary bypass graft: Secondary | ICD-10-CM

## 2014-08-19 DIAGNOSIS — R079 Chest pain, unspecified: Secondary | ICD-10-CM

## 2014-08-19 DIAGNOSIS — I482 Chronic atrial fibrillation: Secondary | ICD-10-CM | POA: Diagnosis present

## 2014-08-19 DIAGNOSIS — Z7901 Long term (current) use of anticoagulants: Secondary | ICD-10-CM

## 2014-08-19 DIAGNOSIS — I10 Essential (primary) hypertension: Secondary | ICD-10-CM | POA: Diagnosis present

## 2014-08-19 DIAGNOSIS — E785 Hyperlipidemia, unspecified: Secondary | ICD-10-CM | POA: Diagnosis present

## 2014-08-19 DIAGNOSIS — I349 Nonrheumatic mitral valve disorder, unspecified: Secondary | ICD-10-CM | POA: Diagnosis present

## 2014-08-19 DIAGNOSIS — I25110 Atherosclerotic heart disease of native coronary artery with unstable angina pectoris: Secondary | ICD-10-CM | POA: Diagnosis not present

## 2014-08-19 DIAGNOSIS — J449 Chronic obstructive pulmonary disease, unspecified: Secondary | ICD-10-CM | POA: Diagnosis present

## 2014-08-19 DIAGNOSIS — Z955 Presence of coronary angioplasty implant and graft: Secondary | ICD-10-CM

## 2014-08-19 DIAGNOSIS — Z79899 Other long term (current) drug therapy: Secondary | ICD-10-CM

## 2014-08-19 DIAGNOSIS — Z952 Presence of prosthetic heart valve: Secondary | ICD-10-CM

## 2014-08-19 DIAGNOSIS — J441 Chronic obstructive pulmonary disease with (acute) exacerbation: Secondary | ICD-10-CM | POA: Diagnosis not present

## 2014-08-19 DIAGNOSIS — Z950 Presence of cardiac pacemaker: Secondary | ICD-10-CM

## 2014-08-19 LAB — CBC
HEMATOCRIT: 29.9 % — AB (ref 39.0–52.0)
Hemoglobin: 10.1 g/dL — ABNORMAL LOW (ref 13.0–17.0)
MCH: 29.8 pg (ref 26.0–34.0)
MCHC: 33.8 g/dL (ref 30.0–36.0)
MCV: 88.2 fL (ref 78.0–100.0)
Platelets: 154 10*3/uL (ref 150–400)
RBC: 3.39 MIL/uL — ABNORMAL LOW (ref 4.22–5.81)
RDW: 14 % (ref 11.5–15.5)
WBC: 12.5 10*3/uL — ABNORMAL HIGH (ref 4.0–10.5)

## 2014-08-19 LAB — BASIC METABOLIC PANEL
ANION GAP: 9 (ref 5–15)
BUN: 22 mg/dL (ref 6–23)
CALCIUM: 8.6 mg/dL (ref 8.4–10.5)
CO2: 24 mmol/L (ref 19–32)
Chloride: 105 mmol/L (ref 96–112)
Creatinine, Ser: 1.26 mg/dL (ref 0.50–1.35)
GFR calc non Af Amer: 57 mL/min — ABNORMAL LOW (ref >90)
GFR, EST AFRICAN AMERICAN: 66 mL/min — AB (ref >90)
GLUCOSE: 121 mg/dL — AB (ref 70–99)
POTASSIUM: 4.2 mmol/L (ref 3.5–5.1)
SODIUM: 138 mmol/L (ref 135–145)

## 2014-08-19 LAB — I-STAT TROPONIN, ED: Troponin i, poc: 0.01 ng/mL (ref 0.00–0.08)

## 2014-08-19 MED ORDER — ALBUTEROL SULFATE HFA 108 (90 BASE) MCG/ACT IN AERS
2.0000 | INHALATION_SPRAY | Freq: Once | RESPIRATORY_TRACT | Status: AC
  Administered 2014-08-19: 2 via RESPIRATORY_TRACT
  Filled 2014-08-19: qty 6.7

## 2014-08-19 NOTE — ED Provider Notes (Signed)
CSN: [id number]     Arrival date & time 08/19/14  1858 History   First MD Initiated Contact with Patient 08/19/14 1901     No chief complaint on file.    (Consider location/radiation/quality/duration/timing/severity/associated sxs/prior Treatment) Patient is a 69 y.o. male presenting with chest pain.  Chest Pain Pain location:  Substernal area Pain quality: tightness   Pain radiates to:  Does not radiate Pain severity:  Severe Onset quality:  Gradual Duration:  12 hours Timing:  Constant Progression:  Resolved Chronicity:  New Context comment:  URI symptoms for a few days Relieved by:  Nitroglycerin (Nitroglycerin by EMS) Worsened by:  Nothing tried Associated symptoms: cough, nausea, shortness of breath and vomiting     Past Medical History  Diagnosis Date  . DVT (deep venous thrombosis)   . 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 COPD (chronic obstructive pulmonary disease)   . Depression   . Hypertension   . Dyslipidemia   . Ventral hernia   . CAD (coronary artery disease)   . Hernia   . Chronic systolic congestive heart failure   . Bipolar disorder   . Anxiety   . Atrial fibrillation     persistent  . Alcohol abuse   . Complete heart block     s/p prior AV nodal ablation  . Stroke   . CVA (cerebral vascular accident)     multiple prior embolic strokes  . Pacemaker   . Pacemaker     PACEMAKER   Past Surgical History  Procedure Laterality Date  . Cystoscopy w/ ureteral stent placement  07/12/2011    Procedure: CYSTOSCOPY WITH RETROGRADE PYELOGRAM/URETERAL STENT PLACEMENT;  Surgeon: Sebastian Ache, MD;  Location: [facility] OR;  Service: Urology;  Laterality: Left;  . Hernia repair    . Mitral valve replacement      remote [facility] valve 4098 with redo tissue valve 06/2006  . Pacemaker placement  2006    Changed to CRT-D in 2009  . Av node ablation  2007  . Icd generator change  02/01/2014   Gen change to: Medtronic VIVA pulse generator, serial number [id number]  . Cardiac catheterization  06/2006    Mild ostial L main stenosis, CFX stent patent, RCA occluded (old)  . Coronary artery bypass graft  2008  . Pacemaker generator change N/A 02/01/2014    Procedure: PACEMAKER GENERATOR CHANGE;  Surgeon: Duke Salvia, MD;  Location: [facility] CATH LAB;  Service: Cardiovascular;  Laterality: N/A;   Family History  Problem Relation Age of Onset  . Stroke    . Heart disease    . Alzheimer's disease Mother   . Heart attack Father   . Heart attack Son   . Varicose Veins Son   . Deep vein thrombosis Son    History  Substance Use Topics  . Smoking status: Former Smoker -- 2.00 packs/day    Types: Cigarettes    Quit date: 01/30/2003  . Smokeless tobacco: Never Used  . Alcohol Use: 3.0 oz/week    6 drink(s) per week     Comment: occasionally    Review of Systems  Respiratory: Positive for cough and shortness of breath.   Cardiovascular: Positive for chest pain.  Gastrointestinal: Positive for nausea and vomiting.  All other systems reviewed and are negative.     Allergies  Warfarin  Home Medications   Prior to Admission medications   Medication Sig Start  Date End Date Taking? Authorizing Provider  apixaban (ELIQUIS) 5 MG TABS tablet Take 1 tablet (5 mg total) by mouth 2 (two) times daily. 03/30/14   Aundria Rud, NP  atorvastatin (LIPITOR) 20 MG tablet TAKE 1 TABLET BY MOUTH EVERY DAY 05/31/14   [name], MD  carvedilol (COREG) 6.25 MG tablet Take 6.25 mg by mouth 2 (two) times daily with a meal.  02/01/14   Historical Provider, MD  furosemide (LASIX) 40 MG tablet Take 1 tab in AM and 1/2 tab in PM Patient taking differently: Take 20-40 mg by mouth 2 (two) times daily. Take 1 tab in AM and 1/2 tab in PM 02/13/14   [name], MD  lisinopril (PRINIVIL,ZESTRIL) 5 MG tablet TAKE 1 TABLET BY MOUTH EVERY DAY 05/31/14   [name], MD  lisinopril  (PRINIVIL,ZESTRIL) 5 MG tablet TAKE 1 TABLET BY MOUTH EVERY DAY 08/15/14   [name], MD  spironolactone (ALDACTONE) 25 MG tablet Take 1 tablet (25 mg total) by mouth daily. 05/10/14   Bevelyn Buckles Bensimhon, MD   BP 118/91 mmHg  Pulse 101  Temp(Src) 98.8 F (37.1 C) (Oral)  Resp 25  SpO2 95% Physical Exam  Constitutional: He is oriented to person, place, and time. He appears well-developed and well-nourished. No distress.  HENT:  Head: Normocephalic and atraumatic.  Mouth/Throat: Oropharynx is clear and moist.  Eyes: Conjunctivae are normal. Pupils are equal, round, and reactive to light. No scleral icterus.  Neck: Neck supple.  Cardiovascular: Normal rate, regular rhythm, normal heart sounds and intact distal pulses.   No murmur heard. Pulmonary/Chest: Effort normal and breath sounds normal. No stridor. No respiratory distress. He has no wheezes. He has no rales.  Abdominal: Soft. He exhibits no distension. There is no tenderness. There is no rebound and no guarding.  Musculoskeletal: Normal range of motion. He exhibits edema (Trace bilateral lower extremity).  Neurological: He is alert and oriented to person, place, and time.  Skin: Skin is warm and dry. No rash noted.  Psychiatric: He has a normal mood and affect. His behavior is normal.  Nursing note and vitals reviewed.   ED Course  Procedures (including critical care time) Labs Review Labs Reviewed  CBC - Abnormal; Notable for the following:    WBC 12.5 (*)    RBC 3.39 (*)    Hemoglobin 10.1 (*)    HCT 29.9 (*)    All other components within normal limits  BASIC METABOLIC PANEL - Abnormal; Notable for the following:    Glucose, Bld 121 (*)    GFR calc non Af Amer 57 (*)    GFR calc Af Amer 66 (*)    All other components within normal limits  Rosezena Sensor, ED    Imaging Review Dg Chest 2 View  08/19/2014   CLINICAL DATA:  Right-sided chest pain.  Smoker.  EXAM: CHEST  2 VIEW  COMPARISON:  03/19/2014  FINDINGS:  Sternotomy wires, mitral valve and left-sided pacemaker are unchanged. Lungs are adequately inflated and demonstrate hazy prominence of the perihilar markings likely mild vascular congestion. No evidence of effusion. There is calcified pleural plaque over the left diaphragm. Mild stable cardiomegaly. Remainder of the exam is unchanged.  IMPRESSION: Mild stable cardiomegaly with findings suggesting mild vascular congestion.  Asbestos related pleural disease.   Electronically Signed   By: Elberta Fortis M.D.   On: 08/19/2014 20:02  All radiology studies independently viewed by me.      EKG  Interpretation None     Muse error EKG- AV paced rhythm, rate 90, similar to prior.   MDM   Final diagnoses:  Chest pain    69 year old male who presents with chest tightness, shortness of breath, nausea and vomiting, in the setting of a few days of upper respiratory infection symptoms. Pain resolved with nitroglycerin. Given 324 of aspirin prior to arrival by EMS.  Initial workup negative.  However, he has history of COPD and appears mild dyspneic on re-exam.  Given albuterol.  He also has trace edema and mild pulmonary vascular congestion.  Plan admit for serial troponins and further treatment.   Blake Divine, MD 08/19/14 [phone]

## 2014-08-19 NOTE — ED Notes (Addendum)
Pt arrives via EMS from home c/o CP starting this afternoon. EMS gave 3 doses of NTG and 324 ASA. Pt went from 10/10 pain to 0/10 pain.pt has AV pacemaker defibrillator. Non-demand. C/o recent nasal drainage, head cold and SOB.

## 2014-08-19 NOTE — ED Notes (Signed)
MD at bedside. 

## 2014-08-19 NOTE — ED Notes (Signed)
Placed patient on 2L. Sats 88 while sleeping

## 2014-08-20 ENCOUNTER — Encounter (HOSPITAL_COMMUNITY): Payer: Self-pay | Admitting: Internal Medicine

## 2014-08-20 DIAGNOSIS — R079 Chest pain, unspecified: Secondary | ICD-10-CM

## 2014-08-20 DIAGNOSIS — I442 Atrioventricular block, complete: Secondary | ICD-10-CM

## 2014-08-20 DIAGNOSIS — Z7901 Long term (current) use of anticoagulants: Secondary | ICD-10-CM | POA: Diagnosis not present

## 2014-08-20 DIAGNOSIS — I25119 Atherosclerotic heart disease of native coronary artery with unspecified angina pectoris: Secondary | ICD-10-CM | POA: Diagnosis not present

## 2014-08-20 DIAGNOSIS — I4891 Unspecified atrial fibrillation: Secondary | ICD-10-CM | POA: Diagnosis not present

## 2014-08-20 DIAGNOSIS — E785 Hyperlipidemia, unspecified: Secondary | ICD-10-CM

## 2014-08-20 DIAGNOSIS — I209 Angina pectoris, unspecified: Secondary | ICD-10-CM | POA: Diagnosis not present

## 2014-08-20 LAB — BASIC METABOLIC PANEL
Anion gap: 7 (ref 5–15)
BUN: 20 mg/dL (ref 6–23)
CALCIUM: 8.5 mg/dL (ref 8.4–10.5)
CHLORIDE: 106 mmol/L (ref 96–112)
CO2: 26 mmol/L (ref 19–32)
Creatinine, Ser: 1.11 mg/dL (ref 0.50–1.35)
GFR calc Af Amer: 77 mL/min — ABNORMAL LOW (ref >90)
GFR calc non Af Amer: 67 mL/min — ABNORMAL LOW (ref >90)
Glucose, Bld: 107 mg/dL — ABNORMAL HIGH (ref 70–99)
POTASSIUM: 4.1 mmol/L (ref 3.5–5.1)
SODIUM: 139 mmol/L (ref 135–145)

## 2014-08-20 LAB — CBC
HEMATOCRIT: 29.4 % — AB (ref 39.0–52.0)
HEMOGLOBIN: 9.8 g/dL — AB (ref 13.0–17.0)
MCH: 29.7 pg (ref 26.0–34.0)
MCHC: 33.3 g/dL (ref 30.0–36.0)
MCV: 89.1 fL (ref 78.0–100.0)
Platelets: 139 10*3/uL — ABNORMAL LOW (ref 150–400)
RBC: 3.3 MIL/uL — AB (ref 4.22–5.81)
RDW: 14.2 % (ref 11.5–15.5)
WBC: 9.6 10*3/uL (ref 4.0–10.5)

## 2014-08-20 LAB — TROPONIN I
TROPONIN I: 0.11 ng/mL — AB (ref <0.031)
Troponin I: 0.12 ng/mL — ABNORMAL HIGH (ref <0.031)

## 2014-08-20 MED ORDER — IPRATROPIUM-ALBUTEROL 0.5-2.5 (3) MG/3ML IN SOLN
3.0000 mL | Freq: Three times a day (TID) | RESPIRATORY_TRACT | Status: DC
  Administered 2014-08-21 – 2014-08-22 (×4): 3 mL via RESPIRATORY_TRACT
  Filled 2014-08-20 (×4): qty 3

## 2014-08-20 MED ORDER — ACETAMINOPHEN 650 MG RE SUPP: 650.0000 mg | Freq: Four times a day (QID) | RECTAL | Status: DC | PRN

## 2014-08-20 MED ORDER — SODIUM CHLORIDE 0.9 % IJ SOLN
3.0000 mL | Freq: Two times a day (BID) | INTRAMUSCULAR | Status: DC
  Administered 2014-08-20 (×3): 3 mL via INTRAVENOUS

## 2014-08-20 MED ORDER — FUROSEMIDE 40 MG PO TABS
40.0000 mg | ORAL_TABLET | Freq: Every day | ORAL | Status: DC
  Administered 2014-08-20 – 2014-08-23 (×4): 40 mg via ORAL
  Filled 2014-08-20 (×6): qty 1

## 2014-08-20 MED ORDER — ONDANSETRON HCL 4 MG/2ML IJ SOLN: 4.0000 mg | Freq: Four times a day (QID) | INTRAMUSCULAR | Status: DC | PRN

## 2014-08-20 MED ORDER — OXYCODONE HCL 5 MG PO TABS: 5.0000 mg | ORAL_TABLET | ORAL | Status: DC | PRN

## 2014-08-20 MED ORDER — FUROSEMIDE 10 MG/ML IJ SOLN
40.0000 mg | Freq: Once | INTRAMUSCULAR | Status: AC
  Administered 2014-08-20: 40 mg via INTRAVENOUS
  Filled 2014-08-20: qty 4

## 2014-08-20 MED ORDER — HYDROMORPHONE HCL 1 MG/ML IJ SOLN: 0.5000 mg | INTRAMUSCULAR | Status: DC | PRN

## 2014-08-20 MED ORDER — ACETAMINOPHEN 325 MG PO TABS: 650.0000 mg | ORAL_TABLET | Freq: Four times a day (QID) | ORAL | Status: DC | PRN

## 2014-08-20 MED ORDER — FUROSEMIDE 20 MG PO TABS
20.0000 mg | ORAL_TABLET | Freq: Every day | ORAL | Status: DC
Start: 2014-08-20 — End: 2014-08-23
  Administered 2014-08-20 – 2014-08-22 (×4): 20 mg via ORAL
  Filled 2014-08-20 (×5): qty 1

## 2014-08-20 MED ORDER — APIXABAN 5 MG PO TABS
5.0000 mg | ORAL_TABLET | Freq: Two times a day (BID) | ORAL | Status: DC
  Administered 2014-08-20 – 2014-08-21 (×4): 5 mg via ORAL
  Filled 2014-08-20 (×7): qty 1

## 2014-08-20 MED ORDER — ASPIRIN EC 325 MG PO TBEC
325.0000 mg | DELAYED_RELEASE_TABLET | Freq: Every day | ORAL | Status: DC
  Administered 2014-08-20: 325 mg via ORAL
  Filled 2014-08-20 (×2): qty 1

## 2014-08-20 MED ORDER — LISINOPRIL 5 MG PO TABS
5.0000 mg | ORAL_TABLET | Freq: Every day | ORAL | Status: DC
  Administered 2014-08-22 – 2014-08-23 (×2): 5 mg via ORAL
  Filled 2014-08-20 (×4): qty 1

## 2014-08-20 MED ORDER — ATORVASTATIN CALCIUM 20 MG PO TABS
20.0000 mg | ORAL_TABLET | Freq: Every day | ORAL | Status: DC
  Administered 2014-08-20 – 2014-08-23 (×4): 20 mg via ORAL
  Filled 2014-08-20 (×4): qty 1

## 2014-08-20 MED ORDER — IPRATROPIUM-ALBUTEROL 0.5-2.5 (3) MG/3ML IN SOLN
3.0000 mL | Freq: Four times a day (QID) | RESPIRATORY_TRACT | Status: DC
  Administered 2014-08-20 (×2): 3 mL via RESPIRATORY_TRACT
  Filled 2014-08-20 (×2): qty 3

## 2014-08-20 MED ORDER — SPIRONOLACTONE 25 MG PO TABS
25.0000 mg | ORAL_TABLET | Freq: Every day | ORAL | Status: DC
  Administered 2014-08-20 – 2014-08-23 (×3): 25 mg via ORAL
  Filled 2014-08-20 (×4): qty 1

## 2014-08-20 MED ORDER — FUROSEMIDE 20 MG PO TABS: 20.0000 mg | ORAL_TABLET | Freq: Two times a day (BID) | ORAL | Status: DC

## 2014-08-20 MED ORDER — PREDNISONE 50 MG PO TABS
60.0000 mg | ORAL_TABLET | Freq: Every day | ORAL | Status: DC
  Administered 2014-08-20 – 2014-08-23 (×4): 60 mg via ORAL
  Filled 2014-08-20 (×5): qty 1

## 2014-08-20 MED ORDER — ONDANSETRON HCL 4 MG PO TABS: 4.0000 mg | ORAL_TABLET | Freq: Four times a day (QID) | ORAL | Status: DC | PRN

## 2014-08-20 MED ORDER — CARVEDILOL 6.25 MG PO TABS
6.2500 mg | ORAL_TABLET | Freq: Two times a day (BID) | ORAL | Status: DC
  Administered 2014-08-20 – 2014-08-23 (×8): 6.25 mg via ORAL
  Filled 2014-08-20 (×9): qty 1

## 2014-08-20 MED ORDER — ALUM & MAG HYDROXIDE-SIMETH 200-200-20 MG/5ML PO SUSP: 30.0000 mL | Freq: Four times a day (QID) | ORAL | Status: DC | PRN

## 2014-08-20 NOTE — Progress Notes (Signed)
Patient admitted early this morning. H&P reviewed. Patient seen and examined.  Patient is a poor historian. It appears that he has been having upper respiratory symptoms including cough along with some wheezing for the past few days. And then yesterday he developed chest pain which was relieved with nitroglycerin. He denies any chest pain currently. He is a Current smoker. He admits to wheezing.   Temperature 98.8. Heart rate 66. Respiratory rate 18. Blood pressure 112/54, saturation 100% on 2 L by nasal cannula.  His lungs reveal bilateral wheezing. No crackles. No rhonchi. S1, S2 is normal, regular. No rubs, murmurs or bruit. No pedal edema Abdomen is soft. Nontender, nondistended. Bowel sounds are present. No masses, organomegaly Alert and oriented 3. No focal neurological deficits are noted.  Patient has a known history of chronic systolic and his heart failure with EF of 25%. He has chronic atrial fibrillation on anticoagulation. He has a history of ventricular tachycardia status post ICD placement.  Patient presents with chest pain. EKG shows atrial fibrillation with intraventricular conduction delay. Similar to previous EKGs. His symptoms did resolve with the nitroglycerin. However, he does have other respiratory symptoms including cough and wheezing which could have precipitated the chest pain. He could have URTI/acute bronchitis.  Continue to trend troponin. We will also initiate steroids, nebulizer treatments to treat his URTI and bronchitis.  If he rules out for acute coronary syndrome and doesn't have recurrence of his symptoms, he could be followed up by cardiology in their office.  [name] 08/20/2014 2:22 PM

## 2014-08-20 NOTE — H&P (Signed)
[name] Admission History and Physical       ADMIRAL MARCUCCI ZOX:[id number] DOB: 10-02-1945 DOA: 08/19/2014  Referring physician: EDP PCP: Cain Saupe, MD  Specialists:   Chief Complaint: Chest Pain  HPI: [name] is a 69 y.o. male with a history of CAD, S/P CABG, Atrial Fibrillation on Eliquis Rx, Systolic CHF, HTN, COPD, Hyperlipidemia and previous CVAs who presents to the ED with complaints of substernal area chest pain in the afternoon that he rated at a 10/10. He had associated SOB, and nausea and Diaphoresis.   His pain was relieved after he had been given 3 SL NTGs and ASA rx on Route by EMS.  HeHe was pain free in the ED and referred for further work up.     Review of Systems:  Constitutional: No Weight Loss, No Weight Gain, Night Sweats, Fevers, Chills, Dizziness, Light Headedness, Fatigue, or Generalized Weakness HEENT: No Headaches, Difficulty Swallowing,Tooth/Dental Problems,Sore Throat,  No Sneezing, Rhinitis, Ear Ache, Nasal Congestion, or Post Nasal Drip,  Cardio-vascular:  +Chest pain, Orthopnea, PND, Edema in Lower Extremities, Anasarca, Dizziness, Palpitations  Resp:    +Dyspnea, No DOE, No Productive Cough, No Non-Productive Cough, No Hemoptysis, No Wheezing.    GI: No Heartburn, Indigestion, Abdominal Pain, +Nausea, Vomiting, Diarrhea, Constipation, Hematemesis, Hematochezia, Melena, Change in Bowel Habits,  Loss of Appetite  GU: No Dysuria, No Change in Color of Urine, No Urgency or Urinary Frequency, No Flank pain.  Musculoskeletal: No Joint Pain or Swelling, No Decreased Range of Motion, No Back Pain.  Neurologic: No Syncope, No Seizures, Muscle Weakness, Paresthesia, Vision Disturbance or Loss, No Diplopia, No Vertigo, No Difficulty Walking,  Skin: No Rash or Lesions. Psych: No Change in Mood or Affect, No Depression or Anxiety, No Memory loss, No Confusion, or Hallucinations   Past Medical History  Diagnosis Date  . DVT (deep venous  thrombosis)   . 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 COPD (chronic obstructive pulmonary disease)   . Depression   . Hypertension   . Dyslipidemia   . Ventral hernia   . CAD (coronary artery disease)   . Hernia   . Chronic systolic congestive heart failure   . Bipolar disorder   . Anxiety   . Atrial fibrillation     persistent  . Alcohol abuse   . Complete heart block     s/p prior AV nodal ablation  . Stroke   . CVA (cerebral vascular accident)     multiple prior embolic strokes  . Pacemaker   . Pacemaker     PACEMAKER     Past Surgical History  Procedure Laterality Date  . Cystoscopy w/ ureteral stent placement  07/12/2011    Procedure: CYSTOSCOPY WITH RETROGRADE PYELOGRAM/URETERAL STENT PLACEMENT;  Surgeon: Sebastian Ache, MD;  Location: [facility] OR;  Service: Urology;  Laterality: Left;  . Hernia repair    . Mitral valve replacement      remote [facility] valve 8119 with redo tissue valve 06/2006  . Pacemaker placement  2006    Changed to CRT-D in 2009  . Av node ablation  2007  . Icd generator change  02/01/2014    Gen change to: Medtronic VIVA pulse generator, serial number [id number]  . Cardiac catheterization  06/2006    Mild ostial L main stenosis, CFX stent patent, RCA occluded (old)  . Coronary artery bypass graft  2008  . Pacemaker generator  change N/A 02/01/2014    Procedure: PACEMAKER GENERATOR CHANGE;  Surgeon: Duke Salvia, MD;  Location: [facility] CATH LAB;  Service: Cardiovascular;  Laterality: N/A;      Prior to Admission medications   Medication Sig Start Date End Date Taking? Authorizing Provider  apixaban (ELIQUIS) 5 MG TABS tablet Take 1 tablet (5 mg total) by mouth 2 (two) times daily. 03/30/14  Yes Aundria Rud, NP  aspirin 81 MG tablet Take 81 mg by mouth daily.   Yes Historical Provider, MD  atorvastatin (LIPITOR) 20 MG tablet TAKE 1 TABLET BY MOUTH EVERY DAY 05/31/14  Yes [name], MD  carvedilol (COREG) 6.25 MG tablet Take 6.25 mg by mouth 2 (two) times daily with a meal.  02/01/14  Yes Historical Provider, MD  dextromethorphan-guaiFENesin (MUCINEX DM) 30-600 MG per 12 hr tablet Take 1 tablet by mouth 2 (two) times daily as needed for cough.   Yes Historical Provider, MD  furosemide (LASIX) 40 MG tablet Take 1 tab in AM and 1/2 tab in PM Patient taking differently: Take 20-40 mg by mouth 2 (two) times daily. Take 1 tab in AM and 1/2 tab in PM 02/13/14  Yes [name], MD  guaiFENesin-dextromethorphan [facility] DM) 100-10 MG/5ML syrup Take 5 mLs by mouth every 4 (four) hours as needed for cough.   Yes Historical Provider, MD  lisinopril (PRINIVIL,ZESTRIL) 5 MG tablet TAKE 1 TABLET BY MOUTH EVERY DAY 05/31/14  Yes [name], MD  spironolactone (ALDACTONE) 25 MG tablet Take 1 tablet (25 mg total) by mouth daily. 05/10/14  Yes [name], MD  lisinopril (PRINIVIL,ZESTRIL) 5 MG tablet TAKE 1 TABLET BY MOUTH EVERY DAY Patient not taking: Reported on 08/19/2014 08/15/14   [name], MD     Allergies  Allergen Reactions  . Warfarin Other (See Comments)    Non compliance and ETOH abuse    Social History:  reports that he quit smoking about 11 years ago. His smoking use included Cigarettes. He smoked 2.00 packs per day. He has never used smokeless tobacco. He reports that he drinks about 3.0 oz of alcohol per week. He reports that he does not use illicit drugs.    Family History  Problem Relation Age of Onset  . Stroke    . Heart disease    . Alzheimer's disease Mother   . Heart attack Father   . Heart attack Son   . Varicose Veins Son   . Deep vein thrombosis Son        Physical Exam:  GEN:  Pleasant Elderly Well Nourished and Well Developed 69 y.o. Caucasian male examined and in no acute distress; cooperative with exam Filed Vitals:   08/19/14 2230 08/19/14 2245 08/20/14 0014 08/20/14 0405  BP: 107/52 120/61 117/53 112/54  Pulse:  72 71 72 66  Temp:   99.1 F (37.3 C) 98.8 F (37.1 C)  TempSrc:   Oral Oral  Resp: Weight:   71.305 kg (157 lb 3.2 oz)   SpO2: 92% 96% 93% 100%   Blood pressure 112/54, pulse 66, temperature 98.8 F (37.1 C), temperature source Oral, resp. rate 19, weight 71.305 kg (157 lb 3.2 oz), SpO2 100 %. PSYCH: He is alert and oriented x4; does not appear anxious does not appear depressed; affect is normal HEENT: Normocephalic and Atraumatic, Mucous membranes pink; PERRLA; EOM intact; Fundi:  Benign;  No scleral icterus, Nares: Patent, Oropharynx: Clear, Edentulous,  Neck:  FROM, No Cervical Lymphadenopathy nor Thyromegaly or Carotid Bruit; No JVD; Breasts:: Not examined CHEST WALL: No tenderness CHEST: Normal respiration, clear to auscultation bilaterally HEART: Regular rate and rhythm; no murmurs rubs or gallops BACK: No kyphosis or scoliosis; No CVA tenderness ABDOMEN: Positive Bowel Sounds, Soft Non-Tender, No Rebound or Guarding; No Masses, No Organomegaly. Rectal Exam: Not done EXTREMITIES: No Cyanosis, Clubbing, or Edema; No Ulcerations. Genitalia: not examined PULSES: 2+ and symmetric SKIN: Normal hydration no rash or ulceration CNS:  Alert and Oriented x 4, No Focal Deficits except for mild chronic Dysarthria Vascular: pulses palpable throughout    Labs on Admission:  Basic Metabolic Panel:  Recent Labs Lab 08/19/14 1919 08/20/14 0537  NA 138 139  K 4.2 4.1  CL 105 106  CO2 24 26  GLUCOSE 121* 107*  BUN 22 20  CREATININE 1.26 1.11  CALCIUM 8.6 8.5   Liver Function Tests: No results for input(s): AST, ALT, ALKPHOS, BILITOT, PROT, ALBUMIN in the last 168 hours. No results for input(s): LIPASE, AMYLASE in the last 168 hours. No results for input(s): AMMONIA in the last 168 hours. CBC:  Recent Labs Lab 08/19/14 1919 08/20/14 0537  WBC 12.5* 9.6  HGB 10.1* 9.8*  HCT 29.9* 29.4*  MCV 88.2 89.1  PLT 154 139*   Cardiac Enzymes: No results for  input(s): CKTOTAL, CKMB, CKMBINDEX, TROPONINI in the last 168 hours.  BNP (last 3 results) No results for input(s): BNP in the last 8760 hours.  ProBNP (last 3 results)  Recent Labs  02/06/14 2240 02/28/14 1417  PROBNP 3968.0* 575.8*    CBG: No results for input(s): GLUCAP in the last 168 hours.  Radiological Exams on Admission: Dg Chest 2 View  08/19/2014   CLINICAL DATA:  Right-sided chest pain.  Smoker.  EXAM: CHEST  2 VIEW  COMPARISON:  03/19/2014  FINDINGS: Sternotomy wires, mitral valve and left-sided pacemaker are unchanged. Lungs are adequately inflated and demonstrate hazy prominence of the perihilar markings likely mild vascular congestion. No evidence of effusion. There is calcified pleural plaque over the left diaphragm. Mild stable cardiomegaly. Remainder of the exam is unchanged.  IMPRESSION: Mild stable cardiomegaly with findings suggesting mild vascular congestion.  Asbestos related pleural disease.   Electronically Signed   By: Elberta Fortis M.D.   On: 08/19/2014 20:02     EKG: Independently reviewed. Atrial Fibrillation  Rate =99, No Acute S-T Changes, +LAD       Old Anterior Infarct,    Assessment/Plan:   69 y.o. male with  Principal Problem:   1.   Chest pain   Telemetry Monitoring   Cycle Troponins   Nitropaste, O2 ASA, Rx       Active Problems:   2.   CAD (coronary artery disease)- Pre veius CABG    On Carvedilol, Lisinopril, ASA and Atorvastatin Rx      3.   Chronic systolic congestive heart failure   On Lasix, Carvedilol, and Lisinopril Rx     4.   Atrial fibrillation-    On Carvedilol Rx   On Eliquis Rx     5.   Complete heart block- S/P AICD Pacer     6.    Hyperlipidemia   Continue Atorvastatin Rx     7.   Severe chronic obstructive pulmonary disease   DuoNebs PRN      8.  DVT Prophylaxis    On Eliquis Rx          Code Status:  FULL CODE      Family Communication:    No Family Present    Disposition Plan:    Observation Status        Time spent:  61 Minutes      Ron Parker [name] Pager [phone]   If 7AM -7PM Please Contact the Day Rounding Team MD for [name]  If 7PM-7AM, Please Contact Night-Floor Coverage  [email or web address] Password [email or web address] 08/20/2014, 6:58 AM     ADDENDUM:   Patient was seen and examined on 08/20/2014

## 2014-08-20 NOTE — Progress Notes (Signed)
UR completed 

## 2014-08-20 NOTE — Progress Notes (Signed)
Pt transferred to 2W from ED via NT x 1. Pt alert and oriented upon arrival. No complaints of pain or discomfort. No signs or symptoms of acute distress. Pt connected to telemetry and central monitoring notified. Pt oriented to unit as well as unit procedures. Pt now resting in bed at lowest position. Will continue to monitor. Delfino Lovett, RN, BSN 08/20/2014 12:39 AM

## 2014-08-20 NOTE — Consult Note (Signed)
CARDIOLOGY CONSULT NOTE   Patient ID: [name] MRN: [id number], DOB/AGE: 67-05-1946   Admit date: 08/19/2014 Date of Consult: 08/20/2014   Primary Physician: Cain Saupe, MD Primary Cardiologist: Dr. Renee Harder team   Pt. Profile  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 DVT (deep venous thrombosis)   . 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 COPD (chronic obstructive pulmonary disease)   . Depression   . Hypertension   . Dyslipidemia   . Ventral hernia   . CAD (coronary artery disease)   . Hernia   . Chronic systolic congestive heart failure   . Bipolar disorder   . Anxiety   . Atrial fibrillation     persistent  . Alcohol abuse   . Complete heart block     s/p prior AV nodal ablation  . Stroke   . CVA (cerebral vascular accident)     multiple prior embolic strokes  . Pacemaker   . Pacemaker     PACEMAKER    Past Surgical History  Procedure Laterality Date  . Cystoscopy w/ ureteral stent placement  07/12/2011    Procedure: CYSTOSCOPY WITH RETROGRADE PYELOGRAM/URETERAL STENT PLACEMENT;  Surgeon: Sebastian Ache, MD;  Location: [facility] OR;  Service: Urology;  Laterality: Left;  . Hernia repair    . Mitral valve replacement      remote [facility] valve 7829 with redo tissue valve 06/2006  . Pacemaker placement  2006    Changed to CRT-D in 2009  . Av node ablation  2007  . Icd generator change  02/01/2014    Gen change to: Medtronic VIVA pulse generator, serial number [id number]  .  Cardiac catheterization  06/2006    Mild ostial L main stenosis, CFX stent patent, RCA occluded (old)  . Coronary artery bypass graft  2008  . Pacemaker generator change N/A 02/01/2014    Procedure: PACEMAKER GENERATOR CHANGE;  Surgeon: Duke Salvia, MD;  Location: [facility] CATH LAB;  Service: Cardiovascular;  Laterality: N/A;     Allergies  Allergies  Allergen Reactions  . Warfarin Other (See Comments)    Non compliance and ETOH abuse    HPI  Of note, patient is a poor historian, and has difficulty recalling past medical history.  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 Patient has been followed by both Dr. Ladona Ridgel and the heart failure service. His last echocardiogram on 01/31/2014 showed EF 25-30%, moderately dilated left atrium.   He states he has been doing well at home. He has been compliant with his medication. His daughter has been managing his medication. Previously, patient had long-standing history of atrial fibrillation not on anticoagulation due to his EtOH abuse. However he has been placed on Eliquis since that time. He has recently demonstrated compliance with EtOH cessation. According to the patient,  for the past 2-3 days, he has been dealing with nasal/pulmonary congestion. He endorsed a productive cough, he is unable to tell me the characteristic of the sputum. He says his daughter is concerned about his urine output however unable to tell me further. He states he continued to urinate frequently after taking Lasix. For the past 2 days, patient also had some substernal chest discomfort worse with cough however never fully went away. He states his chest feels  congested.  He presented to [facility] on 08/19/2014 with complaint of chest discomfort, shortness of breath, nausea and diaphoresis. His chest discomfort improved after 3 sublingual nitroglycerin and aspirin, however never truly went away per patient. He was admitted to internal medicine service. Initial point-of-care troponin was negative, subsequent troponin was elevated at 0.12 before trending down. Chest x-ray showed mild cardiomegaly with findings suggesting mild vascular congestion, asbestos related pleural disease. EKG showed atrial fibrillation with ventricularly paced rhythm.  Inpatient Medications  . apixaban  5 mg Oral BID  . aspirin EC  325 mg Oral Daily  . atorvastatin  20 mg Oral q1800  . carvedilol  6.25 mg Oral BID WC  . furosemide  20 mg Oral q1800  . furosemide  40 mg Oral QAC breakfast  . ipratropium-albuterol  3 mL Nebulization Q6H  . lisinopril  5 mg Oral Daily  . predniSONE  60 mg Oral Q breakfast  . sodium chloride  3 mL Intravenous Q12H  . spironolactone  25 mg Oral Daily    Family History Family History  Problem Relation Age of Onset  . Stroke    . Heart disease    . Alzheimer's disease Mother   . Heart attack Father   . Heart attack Son   . Varicose Veins Son   . Deep vein thrombosis Son      Social History History   Social History  . Marital Status: Divorced    Spouse Name: N/A  . Number of Children: N/A  . Years of Education: N/A   Occupational History  . Retired Therapist, music    Social History Main Topics  . Smoking status: Former Smoker -- 2.00 packs/day    Types: Cigarettes    Quit date: 01/30/2003  . Smokeless tobacco: Never Used  . Alcohol Use: 3.0 oz/week    6 drink(s) per week     Comment: occasionally  . Drug Use: No  . Sexual Activity: Not on file   Other Topics Concern  . Not on file   Social History Narrative   ** Merged History Encounter **         Review of Systems  General:  No chills, fever, night sweats  or weight changes.  Cardiovascular:  No dyspnea on exertion, edema, orthopnea, palpitations, paroxysmal nocturnal dyspnea. + chest pain Dermatological: No rash, lesions/masses Respiratory: +cough, dyspnea Urologic: No hematuria, dysuria Abdominal:   No nausea, vomiting, diarrhea, bright red blood per rectum, melena, or hematemesis Neurologic:  No visual changes, wkns, changes in mental status. All other systems reviewed and are otherwise negative except as noted above.  Physical Exam  Blood pressure 115/58, pulse 69, temperature 99.8 F (37.7 C), temperature source Oral, resp. rate 16, height 5\' 10"  (1.778 m), weight 157 lb 3.2 oz (71.305 kg), SpO2 93 %.  General: Pleasant, NAD Psych: Normal affect. Neuro: Alert and oriented X 3. Moves all extremities spontaneously. HEENT: Normal  Neck: Supple without bruits or JVD. Lungs:  Resp regular and unlabored. Bilateral wheezing and rhonchi. Heart:  RRR no s3, s4, or murmurs. Abdomen: Soft, non-tender, non-distended, BS + x 4.  Extremities: No clubbing, cyanosis or edema. DP/PT/Radials 2+ and equal bilaterally.  Labs   Recent Labs  08/20/14 1208  TROPONINI 0.12*   Lab Results  Component Value Date   WBC 9.6 08/20/2014   HGB 9.8* 08/20/2014   HCT 29.4* 08/20/2014   MCV 89.1 08/20/2014   PLT 139* 08/20/2014    Recent Labs Lab 08/20/14 0537  NA 139  K 4.1  CL 106  CO2 26  BUN 20  CREATININE 1.11  CALCIUM 8.5  GLUCOSE 107*   Lab Results  Component Value Date   CHOL 193 08/28/2011   HDL 46.60 08/28/2011   LDLCALC 128* 08/28/2011   TRIG 93.0 08/28/2011   Lab Results  Component Value Date   DDIMER * 04/07/2010    1.04        AT THE INHOUSE ESTABLISHED CUTOFF VALUE OF 0.48 ug/mL FEU, THIS ASSAY HAS BEEN DOCUMENTED IN THE LITERATURE TO HAVE A SENSITIVITY AND NEGATIVE PREDICTIVE VALUE OF AT LEAST 98 TO 99%.  THE TEST RESULT SHOULD BE CORRELATED WITH AN ASSESSMENT OF THE CLINICAL PROBABILITY OF DVT / VTE.     Radiology/Studies  Dg Chest 2 View  08/19/2014   CLINICAL DATA:  Right-sided chest pain.  Smoker.  EXAM: CHEST  2 VIEW  COMPARISON:  03/19/2014  FINDINGS: Sternotomy wires, mitral valve and left-sided pacemaker are unchanged. Lungs are adequately inflated and demonstrate hazy prominence of the perihilar markings likely mild vascular congestion. No evidence of effusion. There is calcified pleural plaque over the left diaphragm. Mild stable cardiomegaly. Remainder of the exam is unchanged.  IMPRESSION: Mild stable cardiomegaly with findings suggesting mild vascular congestion.  Asbestos related pleural disease.   Electronically Signed   By: Elberta Fortis M.D.   On: 08/19/2014 20:02    ECG  ventricularly paced rhythm  ASSESSMENT AND PLAN  1. Chest pain  - somewhat atypical, although patient is a poor historian. EKG paced rhythm  - can consider stress test if enzyme continue to trend down. If significant enzyme change overnight, will consider cath  2. Dyspnea with productive  - WBC elevated on arrival however has since improved.  and his description was concerning for URI  - +JVD and CXR concerning for mild fluid overload - will discuss with MD  3. chronic atrial fibrillation with AV nodal ablation/BiV pacing  4. 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 chronic EtOH abuse 6. mitral valve replacement 2 7. CAD with chronically occluded RCA 8. CVA in the setting of atrial fibrillation 9. Hypertension 10. Hyperlipidemia 11. PAD   Signed, Azalee Course, PA-C 08/20/2014, 4:54 PM Patient seen. History reviewed. The patient is a poor historian. His chest pain is atypical. EKG is not helpful because of a paced ventricular rhythm. His troponins are slightly elevated but are trending downward. He also appears to have superimposed bronchitis with productive cough. Initial white count was elevated, subsequent white count normal. Physical exam shows bilateral rhonchi  and wheezes. Heart reveals a regular paced rhythm. Heart tones are normal. No murmur or gallop heard. Plan: We will obtain a Lexi scan Myoview stress test in a.m.  chest x-ray shows mild vascular congestion. Will give extra Lasix tonight.

## 2014-08-21 ENCOUNTER — Telehealth: Payer: Self-pay | Admitting: Internal Medicine

## 2014-08-21 ENCOUNTER — Encounter: Payer: Self-pay | Admitting: Internal Medicine

## 2014-08-21 ENCOUNTER — Observation Stay (HOSPITAL_COMMUNITY): Payer: Commercial Managed Care - HMO

## 2014-08-21 ENCOUNTER — Encounter (HOSPITAL_COMMUNITY): Payer: Commercial Managed Care - HMO

## 2014-08-21 DIAGNOSIS — I4891 Unspecified atrial fibrillation: Secondary | ICD-10-CM

## 2014-08-21 DIAGNOSIS — R0789 Other chest pain: Secondary | ICD-10-CM

## 2014-08-21 DIAGNOSIS — I5022 Chronic systolic (congestive) heart failure: Secondary | ICD-10-CM

## 2014-08-21 DIAGNOSIS — R079 Chest pain, unspecified: Secondary | ICD-10-CM

## 2014-08-21 DIAGNOSIS — I25118 Atherosclerotic heart disease of native coronary artery with other forms of angina pectoris: Secondary | ICD-10-CM

## 2014-08-21 LAB — CBC
HCT: 32.3 % — ABNORMAL LOW (ref 39.0–52.0)
Hemoglobin: 10.7 g/dL — ABNORMAL LOW (ref 13.0–17.0)
MCH: 29.6 pg (ref 26.0–34.0)
MCHC: 33.1 g/dL (ref 30.0–36.0)
MCV: 89.5 fL (ref 78.0–100.0)
PLATELETS: 146 10*3/uL — AB (ref 150–400)
RBC: 3.61 MIL/uL — AB (ref 4.22–5.81)
RDW: 13.9 % (ref 11.5–15.5)
WBC: 8.4 10*3/uL (ref 4.0–10.5)

## 2014-08-21 LAB — BASIC METABOLIC PANEL
Anion gap: 9 (ref 5–15)
BUN: 27 mg/dL — AB (ref 6–23)
CALCIUM: 8.9 mg/dL (ref 8.4–10.5)
CO2: 26 mmol/L (ref 19–32)
CREATININE: 1.16 mg/dL (ref 0.50–1.35)
Chloride: 102 mmol/L (ref 96–112)
GFR calc non Af Amer: 63 mL/min — ABNORMAL LOW (ref >90)
GFR, EST AFRICAN AMERICAN: 73 mL/min — AB (ref >90)
Glucose, Bld: 170 mg/dL — ABNORMAL HIGH (ref 70–99)
Potassium: 4.3 mmol/L (ref 3.5–5.1)
Sodium: 137 mmol/L (ref 135–145)

## 2014-08-21 LAB — TROPONIN I: Troponin I: 0.09 ng/mL — ABNORMAL HIGH (ref <0.031)

## 2014-08-21 MED ORDER — TECHNETIUM TC 99M SESTAMIBI - CARDIOLITE: 30.0000 | Freq: Once | INTRAVENOUS | Status: AC | PRN

## 2014-08-21 MED ORDER — ASPIRIN 81 MG PO CHEW
81.0000 mg | CHEWABLE_TABLET | ORAL | Status: AC
  Administered 2014-08-22: 81 mg via ORAL
  Filled 2014-08-21: qty 1

## 2014-08-21 MED ORDER — SODIUM CHLORIDE 0.9 % IV SOLN: 250.0000 mL | INTRAVENOUS | Status: DC | PRN

## 2014-08-21 MED ORDER — SODIUM CHLORIDE 0.9 % IJ SOLN
3.0000 mL | Freq: Two times a day (BID) | INTRAMUSCULAR | Status: DC
  Administered 2014-08-21: 3 mL via INTRAVENOUS

## 2014-08-21 MED ORDER — REGADENOSON 0.4 MG/5ML IV SOLN
INTRAVENOUS | Status: AC
  Administered 2014-08-21: 0.4 mg via INTRAVENOUS
  Filled 2014-08-21: qty 5

## 2014-08-21 MED ORDER — ASPIRIN EC 325 MG PO TBEC
325.0000 mg | DELAYED_RELEASE_TABLET | Freq: Every day | ORAL | Status: DC
  Administered 2014-08-23: 325 mg via ORAL
  Filled 2014-08-21: qty 1

## 2014-08-21 MED ORDER — PREDNISONE 20 MG PO TABS: ORAL_TABLET | ORAL | Status: DC

## 2014-08-21 MED ORDER — SODIUM CHLORIDE 0.9 % IJ SOLN: 3.0000 mL | INTRAMUSCULAR | Status: DC | PRN

## 2014-08-21 MED ORDER — TECHNETIUM TC 99M SESTAMIBI GENERIC - CARDIOLITE
10.0000 | Freq: Once | INTRAVENOUS | Status: AC | PRN
  Administered 2014-08-21: 10 via INTRAVENOUS

## 2014-08-21 MED ORDER — REGADENOSON 0.4 MG/5ML IV SOLN
0.4000 mg | Freq: Once | INTRAVENOUS | Status: AC
  Administered 2014-08-21: 0.4 mg via INTRAVENOUS
  Filled 2014-08-21: qty 5

## 2014-08-21 MED ORDER — IPRATROPIUM-ALBUTEROL 18-103 MCG/ACT IN AERO: 2.0000 | INHALATION_SPRAY | RESPIRATORY_TRACT | Status: DC | PRN

## 2014-08-21 MED ORDER — SODIUM CHLORIDE 0.9 % IV SOLN
INTRAVENOUS | Status: DC
  Administered 2014-08-22: 07:00:00 via INTRAVENOUS

## 2014-08-21 NOTE — Progress Notes (Addendum)
[name] PROGRESS NOTE  [name] DSK:[id number] DOB: September 15, 1945 DOA: 08/19/2014  PCP: Cain Saupe, MD  Brief HPI: 69 year old Caucasian male with a past medical history of chronic systolic CHF with a known EF of about 25%, history of coronary artery disease status post CABG, atrial fibrillation on anticoagulation, and previous history of stroke who presented with substernal chest pain with nausea and diaphoresis. He had minimal elevation in his troponin. He also mentioned a few day history of cough and wheezing.  Past medical history:  Past Medical History  Diagnosis Date  . DVT (deep venous thrombosis)   . 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 COPD (chronic obstructive pulmonary disease)   . Depression   . Hypertension   . Dyslipidemia   . Ventral hernia   . CAD (coronary artery disease)   . Hernia   . Chronic systolic congestive heart failure   . Bipolar disorder   . Anxiety   . Atrial fibrillation     persistent  . Alcohol abuse   . Complete heart block     s/p prior AV nodal ablation  . Stroke   . CVA (cerebral vascular accident)     multiple prior embolic strokes  . Pacemaker   . Pacemaker     PACEMAKER    Consultants: Cardiology  Procedures:  Nuclear stress test Moderate risk with a EF of 20%. Large septal, apical and inferior wall infarct without ischemia.  Cardiac catheterization is scheduled for 3/16  Antibiotics: None  Subjective: Patient denies shortness of breath. Cough and wheezing is better.  Objective: Vital Signs  Filed Vitals:   08/21/14 1111 08/21/14 1113 08/21/14 1115 08/21/14 1308  BP: 122/99 88/59 92/57  130/68  Pulse: 81 84 88 85  Temp:    97.7 F (36.5 C)  TempSrc:    Oral  Resp:    18  Height:      Weight:      SpO2:    98%    Intake/Output Summary (Last 24 hours) at 08/21/14 1444 Last data filed at 08/21/14 0900  Gross per 24 hour    Intake    240 ml  Output   1351 ml  Net  -1111 ml   Filed Weights   08/20/14 0014 08/20/14 1152  Weight: 71.305 kg (157 lb 3.2 oz) 71.305 kg (157 lb 3.2 oz)    General appearance: alert, cooperative, appears stated age and no distress Resp: Few wheezes heard bilaterally. No rhonchi. crackles at bases. Cardio: regular rate and rhythm, S1, S2 normal, no murmur, click, rub or gallop GI: soft, non-tender; bowel sounds normal; no masses,  no organomegaly Extremities: extremities normal, atraumatic, no cyanosis or edema Neurologic: Alert and oriented 3. No focal neurological deficits are noted.  Lab Results:  Basic Metabolic Panel:  Recent Labs Lab 08/19/14 1919 08/20/14 0537 08/21/14 0357  NA 138 139 137  K 4.2 4.1 4.3  CL 105 106 102  CO2 24 26 26   GLUCOSE 121* 107* 170*  BUN 22 20 27*  CREATININE 1.26 1.11 1.16  CALCIUM 8.6 8.5 8.9   CBC:  Recent Labs Lab 08/19/14 1919 08/20/14 0537 08/21/14 0357  WBC 12.5* 9.6 8.4  HGB 10.1* 9.8* 10.7*  HCT 29.9* 29.4* 32.3*  MCV 88.2 89.1 89.5  PLT 154 139* 146*   Cardiac Enzymes:  Recent Labs Lab 08/20/14 1208 08/20/14 1602 08/20/14 2230  TROPONINI 0.12* 0.11* 0.09*  Studies/Results: Dg Chest 2 View  08/19/2014   CLINICAL DATA:  Right-sided chest pain.  Smoker.  EXAM: CHEST  2 VIEW  COMPARISON:  03/19/2014  FINDINGS: Sternotomy wires, mitral valve and left-sided pacemaker are unchanged. Lungs are adequately inflated and demonstrate hazy prominence of the perihilar markings likely mild vascular congestion. No evidence of effusion. There is calcified pleural plaque over the left diaphragm. Mild stable cardiomegaly. Remainder of the exam is unchanged.  IMPRESSION: Mild stable cardiomegaly with findings suggesting mild vascular congestion.  Asbestos related pleural disease.   Electronically Signed   By: Elberta Fortis M.D.   On: 08/19/2014 20:02   Nm Myocar Multi W/spect W/wall Motion / Ef  08/21/2014   CLINICAL DATA:   Chest pain  EXAM: Lexiscan Myovue  TECHNIQUE: The patient received IV Lexiscan .  over 15 seconds. 33.0 mCi of Technetium 79m Sestamibi injected at 30 seconds. Quantitative SPECT images were obtained in the vertical, horizontal and short axis planes after a 45 minute delay. Rest images were obtained with similar planes and delay using 10.2 mCi of Technetium 63m Sestamibi.  FINDINGS: ECG:  V pacing  Symptoms: None  RAW Data:  Motion and gut uptake  QPS:  SDS 9  Quantitative Gated SPECT EF: 20%  Perfusion Images: Large inferior septal and apical wall infarct from apex to base with no ischemia  IMPRESSION: Moderate risk scan due to severe decrease in EF 20%. Large septal apical and inferior wall infarct from apex to base with no ischemia  Charlton Haws   Electronically Signed   By: Charlton Haws M.D.   On: 08/21/2014 12:58    Medications:  Scheduled: . apixaban  5 mg Oral BID  . aspirin EC  325 mg Oral Daily  . atorvastatin  20 mg Oral q1800  . carvedilol  6.25 mg Oral BID WC  . furosemide  20 mg Oral q1800  . furosemide  40 mg Oral QAC breakfast  . ipratropium-albuterol  3 mL Nebulization TID  . lisinopril  5 mg Oral Daily  . predniSONE  60 mg Oral Q breakfast  . sodium chloride  3 mL Intravenous Q12H  . spironolactone  25 mg Oral Daily   Continuous:  ZOX:[id number] **OR** acetaminophen, alum & mag hydroxide-simeth, HYDROmorphone (DILAUDID) injection, ondansetron **OR** ondansetron (ZOFRAN) IV, oxyCODONE, technetium sestamibi  Assessment/Plan:  Principal Problem:   Chest pain Active Problems:   Chronic systolic congestive heart failure   Atrial fibrillation   CAD (coronary artery disease)   Complete heart block   Severe chronic obstructive pulmonary disease   Hyperlipidemia    Chest pain in the setting of known coronary artery disease with minimally elevated troponins Cardiology is following. Stress test was done today which was moderate risk with severely depressed EF. Plan is  for cardiac catheterization tomorrow.  CAD (coronary artery disease)- status post CABG  As above. Continue Carvedilol, Lisinopril, ASA and Atorvastatin Rx  Chronic systolic congestive heart failure EF is about 20-25%. Currently appears to be reasonably well compensated. Continue oral Lasix. Continue beta blocker and ACE inhibitor.  Atrial fibrillation On Carvedilol Rx. On Eliquis Rx  Complete heart block S/P AICD Pacer Stable. Monitor on telemetry  Hyperlipidemia Continue Atorvastatin Rx  Severe chronic obstructive pulmonary disease with possibly acute bronchitis DuoNebs PRN. Continue steroids. Will need to be tapered when discharged.  DVT Prophylaxis: On full anticoagulation    Code Status: Full code  Family Communication: Discussed with the patient. Spoke to daughter who mentioned that patient spent 3 months  in [place] and was released about a week ago.  Disposition Plan: Not ready for discharge     [facility]  [name] Pager [phone] 08/21/2014, 2:44 PM  If 7PM-7AM, please contact night-coverage at [email or web address], password [facility]

## 2014-08-21 NOTE — Telephone Encounter (Signed)
Carlean Jews, NP will call patient's daughter

## 2014-08-21 NOTE — Progress Notes (Signed)
The patient tolerated the Lexiscan well.  Stephaie Dardis, PAC 

## 2014-08-21 NOTE — Discharge Instructions (Signed)

## 2014-08-21 NOTE — Telephone Encounter (Signed)
New message       Pt is in the hosp.  Daughter want to talk to someone to get an update on his condition

## 2014-08-21 NOTE — Progress Notes (Addendum)
       Patient Name: [name] Date of Encounter: 08/21/2014    SUBJECTIVE: Now with no chest discomfort. Story has atypical features. He has a flat troponin trend. His reason for presentation was chest pain. Most recent cath 2008. Has h/o AVR and CAD with total RCA and patent CFX stents. TELEMETRY:  Paced Filed Vitals:   08/20/14 2043 08/20/14 2134 08/21/14 0525 08/21/14 0736  BP:  107/53 110/56   Pulse:  88 87   Temp:  99 F (37.2 C) 98.7 F (37.1 C)   TempSrc:  Oral Oral   Resp:  18 18   Height:      Weight:      SpO2: 95% 96% 97% 94%    Intake/Output Summary (Last 24 hours) at 08/21/14 0923 Last data filed at 08/20/14 2300  Gross per 24 hour  Intake    240 ml  Output    750 ml  Net   -510 ml   LABS: Basic Metabolic Panel:  Recent Labs  37/16/96 0537 08/21/14 0357  NA 139 137  K 4.1 4.3  CL 106 102  CO2 26 26  GLUCOSE 107* 170*  BUN 20 27*  CREATININE 1.11 1.16  CALCIUM 8.5 8.9   CBC:  Recent Labs  08/20/14 0537 08/21/14 0357  WBC 9.6 8.4  HGB 9.8* 10.7*  HCT 29.4* 32.3*  MCV 89.1 89.5  PLT 139* 146*   Cardiac Enzymes:  Recent Labs  08/20/14 1208 08/20/14 1602 08/20/14 2230  TROPONINI 0.12* 0.11* 0.09*    Radiology/Studies:  No new data  Physical Exam: Blood pressure 110/56, pulse 87, temperature 98.7 F (37.1 C), temperature source Oral, resp. rate 18, height 5\' 10"  (1.778 m), weight 157 lb 3.2 oz (71.305 kg), SpO2 94 %. Weight change: -0 oz (-0.001 kg)  Wt Readings from Last 3 Encounters:  08/20/14 157 lb 3.2 oz (71.305 kg)  07/31/14 150 lb (68.04 kg)  02/28/14 144 lb 8 oz (65.545 kg)    Chest is clear Cardiac reveals no significant murmur Extremities with no edema.  ASSESSMENT:  1. Chest pain with elevated troponin in a patient with h/o CAD and known RCA total occlusion as well as CFX stents(patent, 2008). Scheduled for Nuclear today. Chest pain is atypical. 2. Chronic systolic HF 3. COPD  Plan:  1. Left and  right heart cath with coronary angio only if nuclear is high risk. Otherwise will intensify med therapy. If low or intermediate risk will be able to discharge assuming no recurrence of pain with activity.  Selinda Eon 08/21/2014, 9:23 AM

## 2014-08-21 NOTE — Progress Notes (Signed)
UR completed 

## 2014-08-22 DIAGNOSIS — Z955 Presence of coronary angioplasty implant and graft: Secondary | ICD-10-CM | POA: Diagnosis not present

## 2014-08-22 DIAGNOSIS — F1010 Alcohol abuse, uncomplicated: Secondary | ICD-10-CM | POA: Diagnosis present

## 2014-08-22 DIAGNOSIS — R079 Chest pain, unspecified: Secondary | ICD-10-CM | POA: Insufficient documentation

## 2014-08-22 DIAGNOSIS — I10 Essential (primary) hypertension: Secondary | ICD-10-CM | POA: Diagnosis present

## 2014-08-22 DIAGNOSIS — I442 Atrioventricular block, complete: Secondary | ICD-10-CM | POA: Diagnosis present

## 2014-08-22 DIAGNOSIS — Z950 Presence of cardiac pacemaker: Secondary | ICD-10-CM | POA: Diagnosis not present

## 2014-08-22 DIAGNOSIS — I209 Angina pectoris, unspecified: Secondary | ICD-10-CM

## 2014-08-22 DIAGNOSIS — Z951 Presence of aortocoronary bypass graft: Secondary | ICD-10-CM | POA: Diagnosis not present

## 2014-08-22 DIAGNOSIS — Z79899 Other long term (current) drug therapy: Secondary | ICD-10-CM | POA: Diagnosis not present

## 2014-08-22 DIAGNOSIS — I482 Chronic atrial fibrillation: Secondary | ICD-10-CM | POA: Diagnosis present

## 2014-08-22 DIAGNOSIS — Z87891 Personal history of nicotine dependence: Secondary | ICD-10-CM | POA: Diagnosis not present

## 2014-08-22 DIAGNOSIS — Z7982 Long term (current) use of aspirin: Secondary | ICD-10-CM | POA: Diagnosis not present

## 2014-08-22 DIAGNOSIS — Z7901 Long term (current) use of anticoagulants: Secondary | ICD-10-CM

## 2014-08-22 DIAGNOSIS — F319 Bipolar disorder, unspecified: Secondary | ICD-10-CM | POA: Diagnosis present

## 2014-08-22 DIAGNOSIS — E785 Hyperlipidemia, unspecified: Secondary | ICD-10-CM | POA: Diagnosis present

## 2014-08-22 DIAGNOSIS — J441 Chronic obstructive pulmonary disease with (acute) exacerbation: Secondary | ICD-10-CM | POA: Diagnosis not present

## 2014-08-22 DIAGNOSIS — I25110 Atherosclerotic heart disease of native coronary artery with unstable angina pectoris: Secondary | ICD-10-CM | POA: Diagnosis present

## 2014-08-22 DIAGNOSIS — F419 Anxiety disorder, unspecified: Secondary | ICD-10-CM | POA: Diagnosis present

## 2014-08-22 DIAGNOSIS — I5022 Chronic systolic (congestive) heart failure: Secondary | ICD-10-CM | POA: Diagnosis present

## 2014-08-22 DIAGNOSIS — Z9114 Patient's other noncompliance with medication regimen: Secondary | ICD-10-CM | POA: Diagnosis present

## 2014-08-22 DIAGNOSIS — Z8673 Personal history of transient ischemic attack (TIA), and cerebral infarction without residual deficits: Secondary | ICD-10-CM | POA: Diagnosis not present

## 2014-08-22 DIAGNOSIS — Z952 Presence of prosthetic heart valve: Secondary | ICD-10-CM | POA: Diagnosis not present

## 2014-08-22 DIAGNOSIS — I349 Nonrheumatic mitral valve disorder, unspecified: Secondary | ICD-10-CM | POA: Diagnosis present

## 2014-08-22 DIAGNOSIS — J449 Chronic obstructive pulmonary disease, unspecified: Secondary | ICD-10-CM

## 2014-08-22 MED ORDER — SODIUM CHLORIDE 0.9 % IJ SOLN: 3.0000 mL | INTRAMUSCULAR | Status: DC | PRN

## 2014-08-22 MED ORDER — SODIUM CHLORIDE 0.9 % IJ SOLN: 3.0000 mL | Freq: Two times a day (BID) | INTRAMUSCULAR | Status: DC

## 2014-08-22 MED ORDER — SODIUM CHLORIDE 0.9 % IV SOLN: 250.0000 mL | INTRAVENOUS | Status: DC | PRN

## 2014-08-22 MED ORDER — ASPIRIN 81 MG PO CHEW
81.0000 mg | CHEWABLE_TABLET | ORAL | Status: AC
  Administered 2014-08-23: 81 mg via ORAL
  Filled 2014-08-22: qty 1

## 2014-08-22 MED ORDER — IPRATROPIUM-ALBUTEROL 0.5-2.5 (3) MG/3ML IN SOLN: 3.0000 mL | Freq: Four times a day (QID) | RESPIRATORY_TRACT | Status: DC | PRN

## 2014-08-22 MED ORDER — SODIUM CHLORIDE 0.9 % IV SOLN: INTRAVENOUS | Status: DC

## 2014-08-22 NOTE — Progress Notes (Signed)
PROGRESS NOTE  [name] ZOX:[id number] DOB: Jan 20, 1946 DOA: 08/19/2014 PCP: Cain Saupe, MD  HPI/Recap of past 87 hours: 69 year old Caucasian male with a past medical history of chronic systolic CHF with a known EF of about 25%, history of coronary artery disease status post CABG, atrial fibrillation on anticoagulation, and previous history of stroke who presented with substernal chest pain with nausea and diaphoresis. He had minimal elevation in his troponin. He also mentioned a few day history of cough and wheezing.  Patient treated with nebulizers and steroids. Flu titer negative. Seen by cardiology who performed stress test given patient's chronic cardiac history. Stress test abnormal with moderate risk. Patient for cardiac catheterization, initially planned for 3/16, delayed until 3/17 because still on eliquis.  Assessment/Plan: Principal Problem:   Chest pain in patient with history of systolic heart failure, CAD and heart block on pacemaker: Now chest pain-free. For cardiac catheterization tomorrow. Active Problems:   Chronic systolic congestive heart failure: Ejection fraction 20-25 percent. On Lasix plus beta blocker plus ACE inhibitor   Atrial fibrillation: Rate controlled, on anticoagulation     Severe chronic obstructive pulmonary disease with mild exacerbation, possibly acute bronchitis: On nebulizers and steroids, much improved   Hyperlipidemia: Continue statin    Pain in the chest   Code Status: Full code  Family Communication: Daughter and other family members at the bedside  Disposition Plan: Cardiac catheterization tomorrow, disposition based on findings   Consultants:  Cardiology  Procedures:  Status post nuclear stress test done 3/15 noting moderate risk with ejection fraction of 20% and large septal, apical and inferior wall infarct without ischemia  Antibiotics:  None   Objective: BP 109/83 mmHg  Pulse 85  Temp(Src) 97.8 F (36.6 C)  (Oral)  Resp 18  Ht  (1.778 m)  Wt 68.2 kg (150 lb 5.7 oz)  BMI 21.57 kg/m2  SpO2 98%  Intake/Output Summary (Last 24 hours) at 08/22/14 1740 Last data filed at 08/22/14 1300  Gross per 24 hour  Intake    240 ml  Output    300 ml  Net    -60 ml   Filed Weights   08/20/14 0014 08/20/14 1152 08/21/14 1453  Weight: 71.305 kg (157 lb 3.2 oz) 71.305 kg (157 lb 3.2 oz) 68.2 kg (150 lb 5.7 oz)    Exam:   General:  Alert and oriented 3, no acute distress  Cardiovascular: Regular rhythm, 2/6 systolic ejection murmur  Respiratory: Decreased breath sounds throughout, no crackles or wheezes  Abdomen: Soft, nontender, nondistended, positive bowel sounds  Musculoskeletal: No clubbing or cyanosis, trace pitting edema   Data Reviewed: Basic Metabolic Panel:  Recent Labs Lab 08/19/14 1919 08/20/14 0537 08/21/14 0357  NA 138 139 137  K 4.2 4.1 4.3  CL 105 106 102  CO2 GLUCOSE 121* 107* 170*  BUN 22 20 27*  CREATININE 1.26 1.11 1.16  CALCIUM 8.6 8.5 8.9   Liver Function Tests: No results for input(s): AST, ALT, ALKPHOS, BILITOT, PROT, ALBUMIN in the last 168 hours. No results for input(s): LIPASE, AMYLASE in the last 168 hours. No results for input(s): AMMONIA in the last 168 hours. CBC:  Recent Labs Lab 08/19/14 1919 08/20/14 0537 08/21/14 0357  WBC 12.5* 9.6 8.4  HGB 10.1* 9.8* 10.7*  HCT 29.9* 29.4* 32.3*  MCV 88.2 89.1 89.5  PLT 154 139* 146*   Cardiac Enzymes:    Recent Labs Lab 08/20/14 1208 08/20/14 1602 08/20/14 2230  TROPONINI  0.12* 0.11* 0.09*   BNP (last 3 results) No results for input(s): BNP in the last 8760 hours.  ProBNP (last 3 results)  Recent Labs  02/06/14 2240 02/28/14 1417  PROBNP 3968.0* 575.8*    CBG: No results for input(s): GLUCAP in the last 168 hours.  No results found for this or any previous visit (from the past 240 hour(s)).   Studies: Nm Myocar Multi W/spect W/wall Motion / Ef  08/21/2014    CLINICAL DATA:  Chest pain  EXAM: Lexiscan Myovue  TECHNIQUE: The patient received IV Lexiscan .4mg  over 15 seconds. 33.0 mCi of Technetium 71m Sestamibi injected at 30 seconds. Quantitative SPECT images were obtained in the vertical, horizontal and short axis planes after a 45 minute delay. Rest images were obtained with similar planes and delay using 10.2 mCi of Technetium 31m Sestamibi.  FINDINGS: ECG:  V pacing  Symptoms: None  RAW Data:  Motion and gut uptake  QPS:  SDS 9  Quantitative Gated SPECT EF: 20%  Perfusion Images: Large inferior septal and apical wall infarct from apex to base with no ischemia  IMPRESSION: Moderate risk scan due to severe decrease in EF 20%. Large septal apical and inferior wall infarct from apex to base with no ischemia  Charlton Haws   Electronically Signed   By: Charlton Haws M.D.   On: 08/21/2014 12:58    Scheduled Meds: . [START ON 08/23/2014] aspirin EC  325 mg Oral Daily  . atorvastatin  20 mg Oral q1800  . carvedilol  6.25 mg Oral BID WC  . furosemide  20 mg Oral q1800  . furosemide  40 mg Oral QAC breakfast  . lisinopril  5 mg Oral Daily  . predniSONE  60 mg Oral Q breakfast  . sodium chloride  3 mL Intravenous Q12H  . sodium chloride  3 mL Intravenous Q12H  . spironolactone  25 mg Oral Daily    Continuous Infusions: . sodium chloride 20 mL/hr at 08/22/14 5597     Time spent: 15 minutes  Hollice Espy  [name] Pager [phone]. If 7PM-7AM, please contact night-coverage at [email or web address], password [facility] 08/22/2014, 5:40 PM

## 2014-08-22 NOTE — Progress Notes (Signed)
Paged by nursing staff. Unable to cath today as he received eliquis yesterday. Will hold eliquis and plan for cath tomorrow.  Ramond Dial PA Pager: [phone]

## 2014-08-22 NOTE — Progress Notes (Signed)
       Patient Name: [name] Date of Encounter: 08/22/2014    SUBJECTIVE:No chest pain and stable without dyspnea.  When order for cath was written we missed the discontinuation of anticoagulation for A Fib (Eliquis).  TELEMETRY:  AF with controlled rate. Filed Vitals:   08/21/14 1505 08/21/14 2113 08/22/14 0344 08/22/14 0750  BP:  99/53 116/63   Pulse:  75 90   Temp:  97.7 F (36.5 C) 97.3 F (36.3 C)   TempSrc:  Oral Oral   Resp:  18 18   Height:      Weight:      SpO2: 97% 93% 95% 96%    Intake/Output Summary (Last 24 hours) at 08/22/14 1038 Last data filed at 08/22/14 0345  Gross per 24 hour  Intake    240 ml  Output    300 ml  Net    -60 ml   LABS: Basic Metabolic Panel:  Recent Labs  80/32/12 0537 08/21/14 0357  NA 139 137  K 4.1 4.3  CL 106 102  CO2 26 26  GLUCOSE 107* 170*  BUN 20 27*  CREATININE 1.11 1.16  CALCIUM 8.5 8.9   CBC:  Recent Labs  08/20/14 0537 08/21/14 0357  WBC 9.6 8.4  HGB 9.8* 10.7*  HCT 29.4* 32.3*  MCV 89.1 89.5  PLT 139* 146*   Cardiac Enzymes:  Recent Labs  08/20/14 1208 08/20/14 1602 08/20/14 2230  TROPONINI 0.12* 0.11* 0.09*   Radiology/Studies:  No new.  Physical Exam: Blood pressure 116/63, pulse 90, temperature 97.3 F (36.3 C), temperature source Oral, resp. rate 18, height 5\' 10"  (1.778 m), weight 150 lb 5.7 oz (68.2 kg), SpO2 96 %. Weight change: -6 lb 13.5 oz (-3.105 kg)  Wt Readings from Last 3 Encounters:  08/21/14 150 lb 5.7 oz (68.2 kg)  07/31/14 150 lb (68.04 kg)  02/28/14 144 lb 8 oz (65.545 kg)   Irregular rhythm. No murmur or gallop. Chest clear anteriorly  ASSESSMENT:  1. Chronic systolic heart failure, stable 2. Elevated troponin 3. CAD with known RCA occlusion and Cfx stents. High risk nuclear due to severe LV dysfunction. 4. Atypical chest pain 5. Anticoagulation with apixaban administered last PM leading to cath cancellation  Plan:  D/C apixaban until after  cath NPO after MN for AM cath  Signed, Lesleigh Noe 08/22/2014, 10:38 AM

## 2014-08-22 NOTE — Progress Notes (Signed)
UR completed 

## 2014-08-22 NOTE — Progress Notes (Signed)
Awaiting cath. His Nuc is high risk and had elevated markers. Last cath 2008. If stable CAD, home later today.

## 2014-08-23 ENCOUNTER — Encounter (HOSPITAL_COMMUNITY): Payer: Self-pay | Admitting: Interventional Cardiology

## 2014-08-23 ENCOUNTER — Encounter (HOSPITAL_COMMUNITY)
Admission: EM | Disposition: A | Discharge status: Home / Self Care | Payer: Self-pay | Source: Home / Self Care | Attending: Internal Medicine

## 2014-08-23 ENCOUNTER — Telehealth: Payer: Self-pay | Admitting: Internal Medicine

## 2014-08-23 DIAGNOSIS — I200 Unstable angina: Secondary | ICD-10-CM | POA: Insufficient documentation

## 2014-08-23 DIAGNOSIS — I25119 Atherosclerotic heart disease of native coronary artery with unspecified angina pectoris: Secondary | ICD-10-CM

## 2014-08-23 DIAGNOSIS — I25110 Atherosclerotic heart disease of native coronary artery with unstable angina pectoris: Principal | ICD-10-CM

## 2014-08-23 HISTORY — PX: LEFT HEART CATHETERIZATION WITH CORONARY ANGIOGRAM: SHX5451

## 2014-08-23 LAB — POCT ACTIVATED CLOTTING TIME: ACTIVATED CLOTTING TIME: 245 s

## 2014-08-23 SURGERY — LEFT HEART CATHETERIZATION WITH CORONARY ANGIOGRAM
Anesthesia: LOCAL

## 2014-08-23 MED ORDER — ISOSORBIDE MONONITRATE ER 30 MG PO TB24
30.0000 mg | ORAL_TABLET | Freq: Every day | ORAL | Status: DC
  Administered 2014-08-23: 30 mg via ORAL
  Filled 2014-08-23: qty 1

## 2014-08-23 MED ORDER — ISOSORBIDE MONONITRATE ER 30 MG PO TB24: 30.0000 mg | ORAL_TABLET | Freq: Every day | ORAL | Status: DC

## 2014-08-23 MED ORDER — HEPARIN (PORCINE) IN NACL 2-0.9 UNIT/ML-% IJ SOLN
INTRAMUSCULAR | Status: AC
  Filled 2014-08-23: qty 1000

## 2014-08-23 MED ORDER — ACETAMINOPHEN 325 MG PO TABS: 650.0000 mg | ORAL_TABLET | ORAL | Status: DC | PRN

## 2014-08-23 MED ORDER — NITROGLYCERIN 1 MG/10 ML FOR IR/CATH LAB
INTRA_ARTERIAL | Status: AC
  Filled 2014-08-23: qty 10

## 2014-08-23 MED ORDER — VERAPAMIL HCL 2.5 MG/ML IV SOLN
INTRAVENOUS | Status: AC
Start: 2014-08-23 — End: 2014-08-23
  Filled 2014-08-23: qty 2

## 2014-08-23 MED ORDER — FENTANYL CITRATE 0.05 MG/ML IJ SOLN
INTRAMUSCULAR | Status: AC
  Filled 2014-08-23: qty 2

## 2014-08-23 MED ORDER — HEPARIN SODIUM (PORCINE) 1000 UNIT/ML IJ SOLN
INTRAMUSCULAR | Status: AC
  Filled 2014-08-23: qty 1

## 2014-08-23 MED ORDER — MIDAZOLAM HCL 2 MG/2ML IJ SOLN
INTRAMUSCULAR | Status: AC
  Filled 2014-08-23: qty 2

## 2014-08-23 MED ORDER — SPIRONOLACTONE 25 MG PO TABS: 25.0000 mg | ORAL_TABLET | Freq: Every day | ORAL | Status: DC

## 2014-08-23 MED ORDER — LIDOCAINE HCL (PF) 1 % IJ SOLN
INTRAMUSCULAR | Status: AC
  Filled 2014-08-23: qty 30

## 2014-08-23 MED ORDER — ONDANSETRON HCL 4 MG/2ML IJ SOLN: 4.0000 mg | Freq: Four times a day (QID) | INTRAMUSCULAR | Status: DC | PRN

## 2014-08-23 MED ORDER — SODIUM CHLORIDE 0.9 % IV SOLN: 1.0000 mL/kg/h | INTRAVENOUS | Status: AC

## 2014-08-23 NOTE — H&P (View-Only) (Signed)
       Patient Name: [name] Date of Encounter: 08/22/2014    SUBJECTIVE:No chest pain and stable without dyspnea.  When order for cath was written we missed the discontinuation of anticoagulation for A Fib (Eliquis).  TELEMETRY:  AF with controlled rate. Filed Vitals:   08/21/14 1505 08/21/14 2113 08/22/14 0344 08/22/14 0750  BP:  99/53 116/63   Pulse:  75 90   Temp:  97.7 F (36.5 C) 97.3 F (36.3 C)   TempSrc:  Oral Oral   Resp:  18 18   Height:      Weight:      SpO2: 97% 93% 95% 96%    Intake/Output Summary (Last 24 hours) at 08/22/14 1038 Last data filed at 08/22/14 0345  Gross per 24 hour  Intake    240 ml  Output    300 ml  Net    -60 ml   LABS: Basic Metabolic Panel:  Recent Labs  08/20/14 0537 08/21/14 0357  NA 139 137  K 4.1 4.3  CL 106 102  CO2 26 26  GLUCOSE 107* 170*  BUN 20 27*  CREATININE 1.11 1.16  CALCIUM 8.5 8.9   CBC:  Recent Labs  08/20/14 0537 08/21/14 0357  WBC 9.6 8.4  HGB 9.8* 10.7*  HCT 29.4* 32.3*  MCV 89.1 89.5  PLT 139* 146*   Cardiac Enzymes:  Recent Labs  08/20/14 1208 08/20/14 1602 08/20/14 2230  TROPONINI 0.12* 0.11* 0.09*   Radiology/Studies:  No new.  Physical Exam: Blood pressure 116/63, pulse 90, temperature 97.3 F (36.3 C), temperature source Oral, resp. rate 18, height 5' 10" (1.778 m), weight 150 lb 5.7 oz (68.2 kg), SpO2 96 %. Weight change: -6 lb 13.5 oz (-3.105 kg)  Wt Readings from Last 3 Encounters:  08/21/14 150 lb 5.7 oz (68.2 kg)  07/31/14 150 lb (68.04 kg)  02/28/14 144 lb 8 oz (65.545 kg)   Irregular rhythm. No murmur or gallop. Chest clear anteriorly  ASSESSMENT:  1. Chronic systolic heart failure, stable 2. Elevated troponin 3. CAD with known RCA occlusion and Cfx stents. High risk nuclear due to severe LV dysfunction. 4. Atypical chest pain 5. Anticoagulation with apixaban administered last PM leading to cath cancellation  Plan:  D/C apixaban until after  cath NPO after MN for AM cath  Signed, SMITH III,HENRY W 08/22/2014, 10:38 AM 

## 2014-08-23 NOTE — Progress Notes (Signed)
   Reviewed the coronary angiogram and intravascular ultrasound results with Dr. Eldridge Dace.  Family also made [place] aware of the psychosocial issues involved with the patient's daily life.  The distal left main is not critical. There is significant ostial circumflex disease. The anatomy is high risk. I do not believe he will be a candidate for surgical revascularization due to low EF and therefore feel that medical management is the most appropriate course at this time. We will add low dose Imdur to the current regimen. Angina should drive any further management considerations such as high risk PCI.  Will need f/u with Primary Cardiologist Dr. Shirlee Latch and EP Dr. Graciela Husbands.  Home later today or in AM.

## 2014-08-23 NOTE — Telephone Encounter (Signed)
°  1. Has your device fired? No  2. Is you device beeping? No  3. Are you experiencing draining or swelling at device site? No  4. Are you calling to see if we received your device transmission? No  5. Have you passed out? No    Pt's daughter calling stating that pt is currently in the hospital and she was told by the doctors at the hospital that she needed to bring the pt's monitor for his devioce to our office to have it re-serviced. Please call back and advise.

## 2014-08-23 NOTE — Progress Notes (Signed)
On cath table now. If no intervention needed, he will be eligible for discharge per discretion of treating team assuming stable access site.

## 2014-08-23 NOTE — Telephone Encounter (Signed)
Informed pt daughter to bring home monitor to next OV to have monitor reconnected to pt device. She verbalized understanding.

## 2014-08-23 NOTE — Progress Notes (Signed)
Pt found to be using arm to push up from bed. Pt and family instructed to not use right are where cath site was. Pt and family verbalized understanding. No more bleeding noted from site. Bruising noted around insertion site. Will continue to monitor pt closely.

## 2014-08-23 NOTE — CV Procedure (Signed)
       PROCEDURE:  Left heart catheterization with selective coronary angiography, intravascular ultrasound of the left main.  INDICATIONS:  Unstable angina  The risks, benefits, and details of the procedure were explained to the patient.  The patient verbalized understanding and wanted to proceed.  Informed written consent was obtained.  PROCEDURE TECHNIQUE:  After Xylocaine anesthesia a 70F slender sheath was placed in the right radial artery with a single anterior needle wall stick.   IV Heparin was given.  Right coronary angiography was done using a Judkins R4 guide catheter.  Left coronary angiography was done using a Judkins L3.5 guide catheter.  Left ventriculography was done using a pigtail catheter.  A TR band was used for hemostasis.   CONTRAST:  Total of 100 cc.  COMPLICATIONS:  None.    HEMODYNAMICS:  Aortic pressure was 92/55; LV pressure was 91/0; LVEDP 7.  There was no gradient between the left ventricle and aorta.    ANGIOGRAPHIC DATA:   The left main coronary artery is patent proximally.  There is a moderate stenosis in the distal left main which appears to be more severe in certain views.  .  The left anterior descending artery is a large vessel which is tortuous proximally.  The left circumflex artery is a large vessel. There appears to be an ostial 80% stenosis. The plaque extends from the distal left main into the circumflex. The proximal stent to the circumflex is patent. There is a medium size ramus which is patent. There is a large first obtuse marginal vessel which has a moderate stenosis in the mid vessel. The remainder of the circumflex is large and does provide collaterals to the RCA system.  The right coronary artery is a large vessel which is chronically occluded in the mid vessel. There are right to right collaterals. There are left to right collaterals as well.  LEFT VENTRICULOGRAM:  Left ventricular angiogram was not done.  LVEDP was 7 mmHg.  INTERVENTIONAL  NARRATIVE: A CLS 3.0 guide catheter was used to engage the left main. A pro-water wire was placed across the area of disease in the left main. A 5 [place] compatible IVUS catheter was advanced into the LAD over the pro-water wire. Pullback was performed.  IMPRESSIONS:  1. Moderate disease in the distal left main coronary artery.  By intravascular ultrasound, the cross-sectional area was 6.9 mm. 2. Very tortuous proximal left anterior descending artery.  There may be some mild proximal disease. The mid to distal vessel appear patent. 3. 80% stenosis in the ostial left circumflex artery.  Mild to moderate disease in the remainder of the circumflex system. 4. Occluded mid right coronary artery.  Right to right and left to right collaterals 5. Left ventricular systolic function not assessed.  LVEDP 7 mmHg.  Ejection fraction not assessed.  RECOMMENDATION:  We'll discuss with Dr. Katrinka Blazing. At this point, would favor medical therapy as his ostial circumflex disease is not favorable for percutaneous intervention. Intervention of the circumflex would likely compromise the LAD circulation. If symptoms cannot be controlled medically, would consider cardiac surgery consult as well.  F/U with Dr. Shirlee Latch and the EP doctors.

## 2014-08-23 NOTE — Telephone Encounter (Signed)
Patient is in the hospital at this time. Patient's daughter needs to refer her questions to the patient's nurse and doctor at the hospital while he is there. Left message for daughter to call back.

## 2014-08-23 NOTE — Telephone Encounter (Signed)
New Msg       Pt daughter calling, states there are blockages and bypass surgeries and stints are risky.   What options does pt have?   Please return pt call.

## 2014-08-23 NOTE — Progress Notes (Signed)
Medicare Important Message given? YES  (If response is "NO", the following Medicare IM given date fields will be blank)  Date Medicare IM given: 08/23/14 Medicare IM given by:  Lillith Mcneff  

## 2014-08-23 NOTE — Interval H&P Note (Signed)
Cath Lab Visit (complete for each Cath Lab visit)  Clinical Evaluation Leading to the Procedure:   ACS: Yes.    Non-ACS:    Anginal Classification: CCS IV  Anti-ischemic medical therapy: Maximal Therapy (2 or more classes of medications)  Non-Invasive Test Results: High-risk stress test findings: cardiac mortality >3%/year  Prior CABG: No previous CABG  TIMI SCORE  Patient Information:  TIMI Score is 4  UA/NSTEMI and intermediate-risk features (e.g., TIMI score 3?4) for short-term risk of death or nonfatal MI  Revascularization of the presumed culprit artery   A (8)  Indication: 10; Score: 8     History and Physical Interval Note:  08/23/2014 12:12 PM  [name]  has presented today for surgery, with the diagnosis of abnormal stress test  The various methods of treatment have been discussed with the patient and family. After consideration of risks, benefits and other options for treatment, the patient has consented to  Procedure(s): LEFT HEART CATHETERIZATION WITH CORONARY ANGIOGRAM (N/A) as a surgical intervention .  The patient's history has been reviewed, patient examined, no change in status, stable for surgery.  I have reviewed the patient's chart and labs.  Questions were answered to the patient's satisfaction.     Dominic Puccini S.

## 2014-08-23 NOTE — Discharge Summary (Signed)
Discharge Summary  [name] WJX:[id number] DOB: 11/28/1945  PCP: Cain Saupe, MD  Admit date: 08/19/2014 Discharge date: 08/23/2014  Time spent: 25 minutes  Recommendations for Outpatient Follow-up:  1. New medication: Imdur 30 mg by mouth daily 2. Patient will follow up with his cardiologist in the next 2 weeks  Discharge Diagnoses:  Active Hospital Problems   Diagnosis Date Noted  . Chest pain 08/19/2014  . Unstable angina   . Chronic anticoagulation 08/22/2014  . Pain in the chest   . Hyperlipidemia 10/27/2011  . Severe chronic obstructive pulmonary disease 08/06/2011  . Complete heart block 07/14/2011  . Chronic systolic congestive heart failure 01/30/2011  . Atrial fibrillation 01/30/2011  . CAD (coronary artery disease) 01/30/2011    Resolved Hospital Problems   Diagnosis Date Noted Date Resolved  No resolved problems to display.    Discharge Condition: Improved, being discharged home  Diet recommendation: Heart healthy  Filed Weights   08/20/14 0014 08/20/14 1152 08/21/14 1453  Weight: 71.305 kg (157 lb 3.2 oz) 71.305 kg (157 lb 3.2 oz) 68.2 kg (150 lb 5.7 oz)    History of present illness and Hospital course:  69 year old Caucasian male with a past medical history of chronic systolic CHF with a known EF of about 25%, history of coronary artery disease status post CABG, atrial fibrillation on anticoagulation, and previous history of stroke who presented with substernal chest pain with nausea and diaphoresis. He had minimal elevation in his troponin. He also mentioned a few day history of cough and wheezing.  Patient treated with nebulizers and steroids. Flu titer negative. Seen by cardiology who performed stress test given patient's chronic cardiac history. Stress test abnormal with moderate risk. Patient for cardiac catheterization, initially planned for 3/16, delayed until 3/17 because still on eliquis.  Catheterization done 3/17 noting moderate disease in  left main, tortuous proximal LAD and 80% stenosis and ostial left circumflex with occluded mid right coronary artery and collaterals. Ejection fraction not assessed. Unable to do percutaneous intervention for ostial circumflex given this might likely compromise LAD circulation so medical therapy was favored and patient was started on Imdur. If patient is not able to tolerate this or continues to have further decline, possibly referral for bypass  The biggest issue outstanding is patient's compliance. According to daughter, patient drinks heavily. He has not been able to drink for the past 3 months as he was incarcerated since December and only recently got out. She will try to keep the patient at her house and keep him from drinking, but she worries that eventually he will go back and do so.   Procedures:  Status post nuclear stress test done 3/15 noting moderate risk with ejection fraction of 20% and large septal, apical and inferior wall infarct without ischemia Status post cardiac catheterization done 3/17 noting 80% stenosis of ostial left circumflex and occluded RCA with no recommended intervention percutaneously given risk for compromise of LAD circulation   Consultations:  Cardiology  Discharge Exam: BP 119/65 mmHg  Pulse 80  Temp(Src) 97.9 F (36.6 C) (Oral)  Resp 19  Ht  (1.778 m)  Wt 68.2 kg (150 lb 5.7 oz)  BMI 21.57 kg/m2  SpO2 94%  General: alert and oriented 2, no acute distress Cardiovascular: regular rate and rhythm, S1-S2, 2/6 systolic ejection murmur Respiratory: clear to auscultation bilaterally  Discharge Instructions You were cared for by a hospitalist during your hospital stay. If you have any questions about your discharge medications or the  care you received while you were in the hospital after you are discharged, you can call the unit and asked to speak with the hospitalist on call if the hospitalist that took care of you is not available. Once you are  discharged, your primary care physician will handle any further medical issues. Please note that NO REFILLS for any discharge medications will be authorized once you are discharged, as it is imperative that you return to your primary care physician (or establish a relationship with a primary care physician if you do not have one) for your aftercare needs so that they can reassess your need for medications and monitor your lab values.  Discharge Instructions    Diet - low sodium heart healthy    Complete by:  As directed      Increase activity slowly    Complete by:  As directed             Medication List    TAKE these medications        albuterol-ipratropium 18-103 MCG/ACT inhaler  Commonly known as:  COMBIVENT  Inhale 2 puffs into the lungs every 4 (four) hours as needed for wheezing or shortness of breath.     apixaban 5 MG Tabs tablet  Commonly known as:  ELIQUIS  Take 1 tablet (5 mg total) by mouth 2 (two) times daily.     aspirin 81 MG tablet  Take 81 mg by mouth daily.     atorvastatin 20 MG tablet  Commonly known as:  LIPITOR  TAKE 1 TABLET BY MOUTH EVERY DAY     carvedilol 6.25 MG tablet  Commonly known as:  COREG  Take 6.25 mg by mouth 2 (two) times daily with a meal.     furosemide 40 MG tablet  Commonly known as:  LASIX  Take 1 tab in AM and 1/2 tab in PM     guaiFENesin-dextromethorphan 100-10 MG/5ML syrup  Commonly known as:  ROBITUSSIN DM  Take 5 mLs by mouth every 4 (four) hours as needed for cough.     dextromethorphan-guaiFENesin 30-600 MG per 12 hr tablet  Commonly known as:  MUCINEX DM  Take 1 tablet by mouth 2 (two) times daily as needed for cough.     isosorbide mononitrate 30 MG 24 hr tablet  Commonly known as:  IMDUR  Take 1 tablet (30 mg total) by mouth daily.     lisinopril 5 MG tablet  Commonly known as:  PRINIVIL,ZESTRIL  TAKE 1 TABLET BY MOUTH EVERY DAY     predniSONE 20 MG tablet  Commonly known as:  DELTASONE  Take 3 tablets once  daily for 3 days, then take 2 tablets once daily for 3 days, then take 1 tablet once daily for 3 days, then STOP.     spironolactone 25 MG tablet  Commonly known as:  ALDACTONE  Take 1 tablet (25 mg total) by mouth daily.       Allergies  Allergen Reactions  . Warfarin Other (See Comments)    Non compliance and ETOH abuse       Follow-up Information    Follow up with FULP, CAMMIE, MD. Schedule an appointment as soon as possible for a visit in 1 week.   Specialty:  Family Medicine   Why:  post hospitalization follow up   Contact information:   [address] [place] [address] [phone]        The results of significant diagnostics from this hospitalization (  including imaging, microbiology, ancillary and laboratory) are listed below for reference.    Significant Diagnostic Studies: Dg Chest 2 View  08/19/2014   CLINICAL DATA:  Right-sided chest pain.  Smoker.  EXAM: CHEST  2 VIEW  COMPARISON:  03/19/2014  FINDINGS: Sternotomy wires, mitral valve and left-sided pacemaker are unchanged. Lungs are adequately inflated and demonstrate hazy prominence of the perihilar markings likely mild vascular congestion. No evidence of effusion. There is calcified pleural plaque over the left diaphragm. Mild stable cardiomegaly. Remainder of the exam is unchanged.  IMPRESSION: Mild stable cardiomegaly with findings suggesting mild vascular congestion.  Asbestos related pleural disease.   Electronically Signed   By: Elberta Fortis M.D.   On: 08/19/2014 20:02   Nm Myocar Multi W/spect W/wall Motion / Ef  08/21/2014   CLINICAL DATA:  Chest pain  EXAM: Lexiscan Myovue  TECHNIQUE: The patient received IV Lexiscan .  over 15 seconds. 33.0 mCi of Technetium 80m Sestamibi injected at 30 seconds. Quantitative SPECT images were obtained in the vertical, horizontal and short axis planes after a 45 minute delay. Rest images were obtained with similar planes and delay using 10.2 mCi of  Technetium 41m Sestamibi.  FINDINGS: ECG:  V pacing  Symptoms: None  RAW Data:  Motion and gut uptake  QPS:  SDS 9  Quantitative Gated SPECT EF: 20%  Perfusion Images: Large inferior septal and apical wall infarct from apex to base with no ischemia  IMPRESSION: Moderate risk scan due to severe decrease in EF 20%. Large septal apical and inferior wall infarct from apex to base with no ischemia  Charlton Haws   Electronically Signed   By: Charlton Haws M.D.   On: 08/21/2014 12:58    Microbiology: No results found for this or any previous visit (from the past 240 hour(s)).   Labs: Basic Metabolic Panel:  Recent Labs Lab 08/19/14 1919 08/20/14 0537 08/21/14 0357  NA 138 139 137  K 4.2 4.1 4.3  CL 105 106 102  CO2 GLUCOSE 121* 107* 170*  BUN 22 20 27*  CREATININE 1.26 1.11 1.16  CALCIUM 8.6 8.5 8.9   Liver Function Tests: No results for input(s): AST, ALT, ALKPHOS, BILITOT, PROT, ALBUMIN in the last 168 hours. No results for input(s): LIPASE, AMYLASE in the last 168 hours. No results for input(s): AMMONIA in the last 168 hours. CBC:  Recent Labs Lab 08/19/14 1919 08/20/14 0537 08/21/14 0357  WBC 12.5* 9.6 8.4  HGB 10.1* 9.8* 10.7*  HCT 29.9* 29.4* 32.3*  MCV 88.2 89.1 89.5  PLT 154 139* 146*   Cardiac Enzymes:  Recent Labs Lab 08/20/14 1208 08/20/14 1602 08/20/14 2230  TROPONINI 0.12* 0.11* 0.09*   BNP: BNP (last 3 results) No results for input(s): BNP in the last 8760 hours.  ProBNP (last 3 results)  Recent Labs  02/06/14 2240 02/28/14 1417  PROBNP 3968.0* 575.8*    CBG: No results for input(s): GLUCAP in the last 168 hours.     Signed:  Hollice Espy  [name] 08/23/2014, 4:18 PM

## 2014-08-23 NOTE — Telephone Encounter (Signed)
F/U        Pt daughter returning call.    Please call back.

## 2014-08-24 NOTE — Telephone Encounter (Signed)
Spoke w/ pt daugther and informed her that I could help her trouble shoot home monitor over the phone and get it working properly. She verbalized understanding and said she would call back once she is home and near the monitor.

## 2014-08-24 NOTE — Telephone Encounter (Signed)
Vinetta Bergamo [place], CMA at 08/23/2014 1:15 PM     Status: Signed       Expand All Collapse All   Informed pt daughter to bring home monitor to next OV to have monitor reconnected to pt device. She verbalized understanding.

## 2014-08-24 NOTE — Telephone Encounter (Signed)
Patient's daughter needs to talk to someone in device clinic. Daughter states that patient cannot wait two weeks before coming in. Patient's daughter wants to talk to device clinic about patient's device. Will forward to device clinic.

## 2014-08-26 ENCOUNTER — Emergency Department (HOSPITAL_COMMUNITY)
Admission: EM | Admit: 2014-08-26 | Discharge: 2014-08-26 | Disposition: A | Discharge status: Home / Self Care | Payer: Commercial Managed Care - HMO | Attending: Emergency Medicine | Admitting: Emergency Medicine

## 2014-08-26 ENCOUNTER — Encounter (HOSPITAL_COMMUNITY): Payer: Self-pay | Admitting: *Deleted

## 2014-08-26 ENCOUNTER — Emergency Department (HOSPITAL_COMMUNITY): Payer: Commercial Managed Care - HMO

## 2014-08-26 DIAGNOSIS — J449 Chronic obstructive pulmonary disease, unspecified: Secondary | ICD-10-CM | POA: Insufficient documentation

## 2014-08-26 DIAGNOSIS — Z8673 Personal history of transient ischemic attack (TIA), and cerebral infarction without residual deficits: Secondary | ICD-10-CM | POA: Diagnosis not present

## 2014-08-26 DIAGNOSIS — E785 Hyperlipidemia, unspecified: Secondary | ICD-10-CM | POA: Diagnosis not present

## 2014-08-26 DIAGNOSIS — Z9889 Other specified postprocedural states: Secondary | ICD-10-CM | POA: Insufficient documentation

## 2014-08-26 DIAGNOSIS — Z86718 Personal history of other venous thrombosis and embolism: Secondary | ICD-10-CM | POA: Diagnosis not present

## 2014-08-26 DIAGNOSIS — I5022 Chronic systolic (congestive) heart failure: Secondary | ICD-10-CM | POA: Diagnosis not present

## 2014-08-26 DIAGNOSIS — Z87891 Personal history of nicotine dependence: Secondary | ICD-10-CM | POA: Insufficient documentation

## 2014-08-26 DIAGNOSIS — I2510 Atherosclerotic heart disease of native coronary artery without angina pectoris: Secondary | ICD-10-CM | POA: Insufficient documentation

## 2014-08-26 DIAGNOSIS — Z7952 Long term (current) use of systemic steroids: Secondary | ICD-10-CM | POA: Diagnosis not present

## 2014-08-26 DIAGNOSIS — Z95810 Presence of automatic (implantable) cardiac defibrillator: Secondary | ICD-10-CM | POA: Insufficient documentation

## 2014-08-26 DIAGNOSIS — Z8659 Personal history of other mental and behavioral disorders: Secondary | ICD-10-CM | POA: Diagnosis not present

## 2014-08-26 DIAGNOSIS — Z951 Presence of aortocoronary bypass graft: Secondary | ICD-10-CM | POA: Diagnosis not present

## 2014-08-26 DIAGNOSIS — Z79899 Other long term (current) drug therapy: Secondary | ICD-10-CM | POA: Insufficient documentation

## 2014-08-26 DIAGNOSIS — R2241 Localized swelling, mass and lump, right lower limb: Secondary | ICD-10-CM | POA: Diagnosis present

## 2014-08-26 DIAGNOSIS — I10 Essential (primary) hypertension: Secondary | ICD-10-CM | POA: Diagnosis not present

## 2014-08-26 DIAGNOSIS — Z7901 Long term (current) use of anticoagulants: Secondary | ICD-10-CM | POA: Diagnosis not present

## 2014-08-26 DIAGNOSIS — M79604 Pain in right leg: Secondary | ICD-10-CM

## 2014-08-26 DIAGNOSIS — Z8719 Personal history of other diseases of the digestive system: Secondary | ICD-10-CM | POA: Insufficient documentation

## 2014-08-26 DIAGNOSIS — M7989 Other specified soft tissue disorders: Secondary | ICD-10-CM

## 2014-08-26 LAB — CBC WITH DIFFERENTIAL/PLATELET
BASOS PCT: 1 % (ref 0–1)
Basophils Absolute: 0.1 10*3/uL (ref 0.0–0.1)
Eosinophils Absolute: 0.7 10*3/uL (ref 0.0–0.7)
Eosinophils Relative: 5 % (ref 0–5)
HCT: 33.4 % — ABNORMAL LOW (ref 39.0–52.0)
Hemoglobin: 11 g/dL — ABNORMAL LOW (ref 13.0–17.0)
Lymphocytes Relative: 10 % — ABNORMAL LOW (ref 12–46)
Lymphs Abs: 1.3 10*3/uL (ref 0.7–4.0)
MCH: 29.6 pg (ref 26.0–34.0)
MCHC: 32.9 g/dL (ref 30.0–36.0)
MCV: 90 fL (ref 78.0–100.0)
Monocytes Absolute: 0.5 10*3/uL (ref 0.1–1.0)
Monocytes Relative: 4 % (ref 3–12)
Neutro Abs: 10.6 10*3/uL — ABNORMAL HIGH (ref 1.7–7.7)
Neutrophils Relative %: 80 % — ABNORMAL HIGH (ref 43–77)
Platelets: 223 10*3/uL (ref 150–400)
RBC: 3.71 MIL/uL — ABNORMAL LOW (ref 4.22–5.81)
RDW: 14.5 % (ref 11.5–15.5)
WBC: 13.2 10*3/uL — ABNORMAL HIGH (ref 4.0–10.5)

## 2014-08-26 LAB — COMPREHENSIVE METABOLIC PANEL
ALT: 71 U/L — ABNORMAL HIGH (ref 0–53)
AST: 68 U/L — AB (ref 0–37)
Albumin: 3.1 g/dL — ABNORMAL LOW (ref 3.5–5.2)
Alkaline Phosphatase: 93 U/L (ref 39–117)
Anion gap: 3 — ABNORMAL LOW (ref 5–15)
BUN: 23 mg/dL (ref 6–23)
CHLORIDE: 104 mmol/L (ref 96–112)
CO2: 30 mmol/L (ref 19–32)
CREATININE: 1.29 mg/dL (ref 0.50–1.35)
Calcium: 8.5 mg/dL (ref 8.4–10.5)
GFR calc Af Amer: 65 mL/min — ABNORMAL LOW (ref >90)
GFR, EST NON AFRICAN AMERICAN: 56 mL/min — AB (ref >90)
Glucose, Bld: 218 mg/dL — ABNORMAL HIGH (ref 70–99)
Potassium: 4.2 mmol/L (ref 3.5–5.1)
SODIUM: 137 mmol/L (ref 135–145)
TOTAL PROTEIN: 6.7 g/dL (ref 6.0–8.3)
Total Bilirubin: 0.5 mg/dL (ref 0.3–1.2)

## 2014-08-26 LAB — BRAIN NATRIURETIC PEPTIDE: B Natriuretic Peptide: 216.4 pg/mL — ABNORMAL HIGH (ref 0.0–100.0)

## 2014-08-26 NOTE — ED Notes (Addendum)
Pt in c/o left leg swelling and cough, pt recently admitted into this hospital for fluid retention, pt given extra lasix at home and has not voided since, called his PCP and was told to come in for further evaluation, pt denies shortness of breath but feels swollen all over

## 2014-08-26 NOTE — ED Provider Notes (Signed)
CSN: [id number]     Arrival date & time 08/26/14  2057 History   First MD Initiated Contact with Patient 08/26/14 2150     Chief Complaint  Patient presents with  . Leg Swelling     (Consider location/radiation/quality/duration/timing/severity/associated sxs/prior Treatment) HPI Comments: He presents tonight after recent admission for unstable angina and CHF with complaint of right lower extremity and bilateral hand swelling. No pain, redness. No fever. He has a history of blood clots, currently on Eliquis and compliant with medications. No SOB or chest pain tonight. Per family member/caregiver, she increased his Lasix per instruction for swelling but felt there was little improvement so she brought him in for evaluation.  The history is provided by the patient and a relative.    Past Medical History  Diagnosis Date  . DVT (deep venous thrombosis)   . 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 COPD (chronic obstructive pulmonary disease)   . Depression   . Hypertension   . Dyslipidemia   . Ventral hernia   . CAD (coronary artery disease)   . Hernia   . Chronic systolic congestive heart failure   . Bipolar disorder   . Anxiety   . Atrial fibrillation     persistent  . Alcohol abuse   . Complete heart block     s/p prior AV nodal ablation  . Stroke   . CVA (cerebral vascular accident)     multiple prior embolic strokes  . Pacemaker   . Pacemaker     PACEMAKER   Past Surgical History  Procedure Laterality Date  . Cystoscopy w/ ureteral stent placement  07/12/2011    Procedure: CYSTOSCOPY WITH RETROGRADE PYELOGRAM/URETERAL STENT PLACEMENT;  Surgeon: Sebastian Ache, MD;  Location: [facility] OR;  Service: Urology;  Laterality: Left;  . Hernia repair    . Mitral valve replacement      remote [facility] valve 7829 with redo tissue valve 06/2006  . Pacemaker placement  2006    Changed to CRT-D in 2009  . Av node ablation   2007  . Icd generator change  02/01/2014    Gen change to: Medtronic VIVA pulse generator, serial number [id number]  . Cardiac catheterization  06/2006    Mild ostial L main stenosis, CFX stent patent, RCA occluded (old)  . Coronary artery bypass graft  2008  . Pacemaker generator change N/A 02/01/2014    Procedure: PACEMAKER GENERATOR CHANGE;  Surgeon: Duke Salvia, MD;  Location: [facility] CATH LAB;  Service: Cardiovascular;  Laterality: N/A;  . Left heart catheterization with coronary angiogram N/A 08/23/2014    Procedure: LEFT HEART CATHETERIZATION WITH CORONARY ANGIOGRAM;  Surgeon: Corky Crafts, MD;  Location: [facility] CATH LAB;  Service: Cardiovascular;  Laterality: N/A;   Family History  Problem Relation Age of Onset  . Stroke    . Heart disease    . Alzheimer's disease Mother   . Heart attack Father   . Heart attack Son   . Varicose Veins Son   . Deep vein thrombosis Son    History  Substance Use Topics  . Smoking status: Former Smoker -- 2.00 packs/day    Types: Cigarettes    Quit date: 01/30/2003  . Smokeless tobacco: Never Used  . Alcohol Use: 3.0 oz/week    6 drink(s) per week     Comment: occasionally    Review of Systems  Constitutional: Negative for fever.  Respiratory: Negative for shortness of breath.   Cardiovascular: Positive for leg swelling. Negative for chest pain.  Gastrointestinal: Negative for vomiting and abdominal pain.  Musculoskeletal: Negative for myalgias.  Skin: Negative for color change and wound.  Neurological: Negative for weakness and numbness.      Allergies  Warfarin  Home Medications   Prior to Admission medications   Medication Sig Start Date End Date Taking? Authorizing Provider  albuterol-ipratropium (COMBIVENT) 18-103 MCG/ACT inhaler Inhale 2 puffs into the lungs every 4 (four) hours as needed for wheezing or shortness of breath. 08/21/14  Yes Osvaldo Shipper, MD  apixaban (ELIQUIS) 5 MG TABS tablet Take 1 tablet (5 mg total) by  mouth 2 (two) times daily. 03/30/14  Yes Aundria Rud, NP  atorvastatin (LIPITOR) 20 MG tablet TAKE 1 TABLET BY MOUTH EVERY DAY 05/31/14  Yes [name], MD  carvedilol (COREG) 6.25 MG tablet Take 6.25 mg by mouth 2 (two) times daily with a meal.  02/01/14  Yes Historical Provider, MD  dextromethorphan-guaiFENesin (MUCINEX DM) 30-600 MG per 12 hr tablet Take 1 tablet by mouth 2 (two) times daily as needed for cough.   Yes Historical Provider, MD  furosemide (LASIX) 40 MG tablet Take 1 tab in AM and 1/2 tab in PM Patient taking differently: Take 20-40 mg by mouth 2 (two) times daily. Take 1 tab in AM and 1/2 tab in PM 02/13/14  Yes [name], MD  guaiFENesin-dextromethorphan [facility] DM) 100-10 MG/5ML syrup Take 5 mLs by mouth every 4 (four) hours as needed for cough.   Yes Historical Provider, MD  isosorbide mononitrate (IMDUR) 30 MG 24 hr tablet Take 1 tablet (30 mg total) by mouth daily. 08/23/14  Yes Hollice Espy, MD  lisinopril (PRINIVIL,ZESTRIL) 5 MG tablet TAKE 1 TABLET BY MOUTH EVERY DAY 05/31/14  Yes [name], MD  predniSONE (DELTASONE) 20 MG tablet Take 3 tablets once daily for 3 days, then take 2 tablets once daily for 3 days, then take 1 tablet once daily for 3 days, then STOP. 08/21/14  Yes Osvaldo Shipper, MD  spironolactone (ALDACTONE) 25 MG tablet Take 1 tablet (25 mg total) by mouth daily. 05/10/14  Yes Bevelyn Buckles Bensimhon, MD   BP 111/51 mmHg  Pulse 58  Temp(Src) 98.2 F (36.8 C) (Oral)  Resp 17  SpO2 95% Physical Exam  Constitutional: He is oriented to person, place, and time. He appears well-developed and well-nourished.  HENT:  Head: Normocephalic.  Neck: Normal range of motion. Neck supple.  Cardiovascular: Normal rate and regular rhythm.   No murmur heard. Pulmonary/Chest: Effort normal and breath sounds normal. He has no wheezes. He has no rales. He exhibits no tenderness.  Abdominal: Soft. Bowel sounds are normal. There is no tenderness.  There is no rebound and no guarding.  Musculoskeletal: Normal range of motion.  Minimal right LE swelling without redness or tenderness. Extremities are equally warm without coolness to touch. No discoloration. FROM and full strength. Hands do not appear swollen and are nontender.  Neurological: He is alert and oriented to person, place, and time.  Skin: Skin is warm and dry. No rash noted.  Psychiatric: He has a normal mood and affect.    ED Course  Procedures (including critical care time) Labs Review Labs Reviewed  CBC WITH DIFFERENTIAL/PLATELET - Abnormal; Notable for the following:    WBC 13.2 (*)    RBC 3.71 (*)    Hemoglobin 11.0 (*)    HCT 33.4 (*)  Neutrophils Relative % 80 (*)    Lymphocytes Relative 10 (*)    Neutro Abs 10.6 (*)    All other components within normal limits  COMPREHENSIVE METABOLIC PANEL - Abnormal; Notable for the following:    Glucose, Bld 218 (*)    Albumin 3.1 (*)    AST 68 (*)    ALT 71 (*)    GFR calc non Af Amer 56 (*)    GFR calc Af Amer 65 (*)    Anion gap 3 (*)    All other components within normal limits  BRAIN NATRIURETIC PEPTIDE - Abnormal; Notable for the following:    B Natriuretic Peptide 216.4 (*)    All other components within normal limits    Imaging Review Dg Chest 2 View  08/26/2014   CLINICAL DATA:  Fluid retention.  Anuria.  EXAM: CHEST  2 VIEW  COMPARISON:  08/19/2014  FINDINGS: There is moderate unchanged cardiomegaly. There is prior sternotomy and mitral valvuloplasty. There are intact appearances of the transvenous leads. Calcified pleural plaque is again evident. Extensive chronic appearing interstitial coarsening is again evident. No superimposed acute infiltrate or congestive heart failure is evident. There are no effusions.  IMPRESSION: Cardiomegaly, asbestos related pleural plaque, and chronic interstitial coarsening. No superimposed acute cardiopulmonary abnormalities are evident.   Electronically Signed   By: Ellery Plunk M.D.   On: 08/26/2014 22:01     EKG Interpretation None      MDM   Final diagnoses:  None    1. LE swelling  Patient is examined by Dr. Hyacinth Meeker. Plan: increase Lasix to twice BID dosing. Follow up with his physician outpatient for recheck in 2-3 days.     Elpidio Anis, PA-C 08/26/14 2302  Eber Hong, MD 08/27/14 [phone]

## 2014-08-26 NOTE — ED Provider Notes (Signed)
The patient is a 69 year old male, he has a history of prior bypass, his family noted that he had increased swelling of his right lower extremity today, this was mild, asymmetrical, it did not seem to be bothering him in any other way. He has had a mild cough recently, on exam he has clear heart and lung sounds, he has minimal if any peripheral edema, no obvious asymmetry or tenderness of the leg. He does have prior vein grafts from the right medial lower extremity. He appears very comfortable, his vital signs appear very normal, his chest x-ray shows no acute infiltrates or pulmonary edema, BNP is normal, will increase Lasix for the next couple of days. Patient and family members aware of findings and agreeable to the plan. Leukocytosis present, unsure the exact etiology. It does not appear to be related to an infectious etiology.  Medical screening examination/treatment/procedure(s) were conducted as a shared visit with non-physician practitioner(s) and myself.  I personally evaluated the patient during the encounter.  Clinical Impression:   Final diagnoses:  Pain and swelling of lower extremity, right         Eber Hong, MD 08/27/14 [phone]

## 2014-08-26 NOTE — Discharge Instructions (Signed)
INCREASE LASIX DOSE TO 40 MG bid X 3 DAYS. FOLLOW UP WITH YOUR DOCTOR FOR RECHECK. RETURN HERE WITH ANY WORSENING SYMPTOMS OR NEW CONCERNS.

## 2014-08-28 ENCOUNTER — Telehealth: Payer: Self-pay | Admitting: Internal Medicine

## 2014-08-28 ENCOUNTER — Encounter: Payer: Self-pay | Admitting: Vascular Surgery

## 2014-08-28 NOTE — Telephone Encounter (Signed)
Left message to call back  

## 2014-08-28 NOTE — Telephone Encounter (Signed)
Pt's dtr thinks pt having problems with  Med--making him sick--left msg on vm to have a call back

## 2014-08-29 ENCOUNTER — Encounter (HOSPITAL_COMMUNITY): Payer: Self-pay

## 2014-08-29 ENCOUNTER — Ambulatory Visit: Payer: Self-pay | Admitting: Vascular Surgery

## 2014-08-29 ENCOUNTER — Telehealth: Payer: Self-pay | Admitting: Internal Medicine

## 2014-08-29 NOTE — Telephone Encounter (Signed)
Patient has an appointment with Saralyn Pilar NP on 08/30/2014.

## 2014-08-29 NOTE — Telephone Encounter (Signed)
New Msg        Pt daughter states she is returning call from today.   Please call.

## 2014-08-29 NOTE — Telephone Encounter (Signed)
F/U      Please daughter returning call from yesterday about pt being sick from meds.   States pt shouldn't wait until April to discuss meds.   Please return call.

## 2014-08-29 NOTE — Telephone Encounter (Signed)
Will have Melissa call and schedule with an extender as Dr Ladona Ridgel is out of the office until second week in April  Melissa left a message on the daughters voicemail

## 2014-08-30 ENCOUNTER — Ambulatory Visit
Admission: RE | Admit: 2014-08-30 | Discharge: 2014-08-30 | Disposition: A | Discharge status: Home / Self Care | Payer: Commercial Managed Care - HMO | Source: Ambulatory Visit | Attending: Family Medicine | Admitting: Family Medicine

## 2014-08-30 ENCOUNTER — Encounter: Payer: Self-pay | Admitting: Nurse Practitioner

## 2014-08-30 ENCOUNTER — Other Ambulatory Visit: Payer: Self-pay | Admitting: Family Medicine

## 2014-08-30 ENCOUNTER — Ambulatory Visit (INDEPENDENT_AMBULATORY_CARE_PROVIDER_SITE_OTHER): Payer: Commercial Managed Care - HMO | Admitting: Nurse Practitioner

## 2014-08-30 VITALS — BP 98/52 | HR 61 | Ht 70.0 in | Wt 155.8 lb

## 2014-08-30 DIAGNOSIS — R05 Cough: Secondary | ICD-10-CM

## 2014-08-30 DIAGNOSIS — F1010 Alcohol abuse, uncomplicated: Secondary | ICD-10-CM

## 2014-08-30 DIAGNOSIS — I059 Rheumatic mitral valve disease, unspecified: Secondary | ICD-10-CM | POA: Insufficient documentation

## 2014-08-30 DIAGNOSIS — I214 Non-ST elevation (NSTEMI) myocardial infarction: Secondary | ICD-10-CM

## 2014-08-30 DIAGNOSIS — F32A Depression, unspecified: Secondary | ICD-10-CM | POA: Insufficient documentation

## 2014-08-30 DIAGNOSIS — I222 Subsequent non-ST elevation (NSTEMI) myocardial infarction: Secondary | ICD-10-CM

## 2014-08-30 DIAGNOSIS — I481 Persistent atrial fibrillation: Secondary | ICD-10-CM

## 2014-08-30 DIAGNOSIS — I5022 Chronic systolic (congestive) heart failure: Secondary | ICD-10-CM

## 2014-08-30 DIAGNOSIS — I2510 Atherosclerotic heart disease of native coronary artery without angina pectoris: Secondary | ICD-10-CM

## 2014-08-30 DIAGNOSIS — J449 Chronic obstructive pulmonary disease, unspecified: Secondary | ICD-10-CM | POA: Insufficient documentation

## 2014-08-30 DIAGNOSIS — F329 Major depressive disorder, single episode, unspecified: Secondary | ICD-10-CM | POA: Insufficient documentation

## 2014-08-30 DIAGNOSIS — R059 Cough, unspecified: Secondary | ICD-10-CM

## 2014-08-30 DIAGNOSIS — E785 Hyperlipidemia, unspecified: Secondary | ICD-10-CM | POA: Diagnosis not present

## 2014-08-30 DIAGNOSIS — I255 Ischemic cardiomyopathy: Secondary | ICD-10-CM | POA: Diagnosis not present

## 2014-08-30 DIAGNOSIS — R0602 Shortness of breath: Secondary | ICD-10-CM

## 2014-08-30 DIAGNOSIS — I4819 Other persistent atrial fibrillation: Secondary | ICD-10-CM

## 2014-08-30 LAB — BASIC METABOLIC PANEL
BUN: 20 mg/dL (ref 6–23)
CO2: 32 mEq/L (ref 19–32)
Calcium: 8.7 mg/dL (ref 8.4–10.5)
Chloride: 102 mEq/L (ref 96–112)
Creatinine, Ser: 1.08 mg/dL (ref 0.40–1.50)
GFR: 72.44 mL/min (ref >60.00)
GLUCOSE: 75 mg/dL (ref 70–99)
POTASSIUM: 3.9 meq/L (ref 3.5–5.1)
SODIUM: 137 meq/L (ref 135–145)

## 2014-08-30 NOTE — Patient Instructions (Signed)
Your physician recommends that you continue on your current medications as directed. Please refer to the Current Medication list given to you today.     Labs today  BMET    FOLLOW  UP WITH BERG ON 4/12/3 ON FLEX SCHEDULE

## 2014-08-30 NOTE — Progress Notes (Signed)
Patient Name: [name] Date of Encounter: 08/30/2014  Primary Care Provider:  Cain Saupe, MD Primary Cardiologist:  G. Ladona Ridgel, MD / [facility]  Chief Complaint  69 y/o male with a h/o CAD, syst CHF, MV dzs s/p replacement x 2, s/p recent hospitalization 2/2 NSTEMI, who presents for f/u.  Past Medical History   Past Medical History  Diagnosis Date  . DVT (deep venous thrombosis)   . Mitral valve disease     a. remote mitral replacement with Bjork Shiley valve;  b. 06/2006 Redo MVR with tissue valve.  Marland Kitchen COPD (chronic obstructive pulmonary disease)   . Depression   . Hypertension   . Dyslipidemia   . Ventral hernia   . CAD (coronary artery disease)     a. s/p prior PCI/stenting of the LCX;  b. 08/2014 MV: EF 20%, large septal, apical, and inferior infarct from apex to base, no ischemia;  c. 08/2014 NSTEMI/Cath: LM mod distal dzs extending into ostial LCX (80%), LAD tortuous, RI nl, OM mod dzs, RCA 100 CTO with R->R and L->R collats-->Med Rx.  . Mixed Ischemic and Nonischemic Cardiomyopathy     a. 8/.2015 Echo: Ef 25-30%;  b. 01/2014 s/p MDT [id number] Talbot Grumbling XT CRT-D (ser # [id number]).  . Chronic systolic congestive heart failure     a. 01/2014 Echo: EF 25-30%, mod conc LVH, mod dil LA.  . Bipolar disorder   . Anxiety   . Persistent atrial fibrillation     a. CHA2DS2VASc = 6->eliquis;  b. S/P AVN RFCA an BiV ICD placement.  . Alcohol abuse   . Complete heart block     a. In setting of prior AV nodal ablation r/t afib-->BiV ICD (01/2014).  . Stroke   . CVA (cerebral vascular accident)     a. Multiple prior embolic strokes.  . Biventricular automatic implantable cardioverter defibrillator in situ     a. 01/2014 s/p MDT [id number] Viva XT CRT-D (ser # [id number]).   Past Surgical History  Procedure Laterality Date  . Cystoscopy w/ ureteral stent placement  07/12/2011    Procedure: CYSTOSCOPY WITH RETROGRADE PYELOGRAM/URETERAL STENT PLACEMENT;  Surgeon: Sebastian Ache, MD;  Location: [facility]  OR;  Service: Urology;  Laterality: Left;  . Hernia repair    . Mitral valve replacement      remote [facility] valve 0350 with redo tissue valve 06/2006  . Pacemaker placement  2006    Changed to CRT-D in 2009  . Av node ablation  2007  . Icd generator change  02/01/2014    Gen change to: Medtronic VIVA pulse generator, serial number [id number]  . Cardiac catheterization  06/2006    Mild ostial L main stenosis, CFX stent patent, RCA occluded (old)  . Coronary artery bypass graft  2008  . Pacemaker generator change N/A 02/01/2014    Procedure: PACEMAKER GENERATOR CHANGE;  Surgeon: Duke Salvia, MD;  Location: [facility] CATH LAB;  Service: Cardiovascular;  Laterality: N/A;  . Left heart catheterization with coronary angiogram N/A 08/23/2014    Procedure: LEFT HEART CATHETERIZATION WITH CORONARY ANGIOGRAM;  Surgeon: Corky Crafts, MD;  Location: [facility] CATH LAB;  Service: Cardiovascular;  Laterality: N/A;   Allergies  Allergies  Allergen Reactions  . Warfarin Other (See Comments)    Non compliance and ETOH abuse   HPI  69 y/o male with the above complex problem list.  He has a h/o CAD, mixed icm/nicm, chronic systolic chf, persistent afib s/p AVN ablation and BiV ICD placement.  He is on chronic eliquis therapy.  He also has a h/o tob abuse (currenlty 3 ppd) and ETOH abuse.  He was recently hospitalized on 3/13 with NSTEMI and flat troponin trend.  Stress testing was undertaken and was high risk 2/2 low EF and prior infarct w/o active ischemia.  Given high risk findings, decision was made to pursue cath.  This showed moderate obstructive ostial LCX dzs involving the distal LM.  He had moderate dzs in the LAD and a CTO of the RCA with R R and L R collaterals.  Decision was made to make an initial attempt @ medical therapy in the setting of LM/LCX dzs where intervention would be high risk and potentially jeopardize the LAD.  With severe LV dysfxn, he is not felt to be an ideal candidate for CABG  (and has had 2 prior thoracic surgeries r/t MVRs).  He was placed on isosorbide prior to discharge and he says that he has been doing reasonably well.  He denies chest pain or dyspnea.  Unfortunately, he continues to smoke 3 ppd.  His daughter prepares his meals for him and although they have not been adding salt to his food, he has been eating either bacon or sausage daily for breakfast, and a cold cuts sandwich with potato chips daily for lunch.  Last Saturday, they had pizza for dinner and on Sunday, his dtr noted significant lower extremity and bilat hand swelling.  His wt had also increased from a baseline of 153 lbs on their home scale to 161.  She gave him extra lasix and then took him to the ED.  There, ECG, CXR, and labs were w/in nl limits.  He was advised to increase his lasix dose to 40 mg BID (up from 40 am, 20 pm).  Since then, his wt has been stable.  He denies chest pain, palpitations, dyspnea, pnd, orthopnea, n, v, dizziness, syncope, edema, weight gain, or early satiety.   Home Medications  Prior to Admission medications   Medication Sig Start Date End Date Taking? Authorizing Provider  albuterol-ipratropium (COMBIVENT) 18-103 MCG/ACT inhaler Inhale 2 puffs into the lungs every 4 (four) hours as needed for wheezing or shortness of breath. 08/21/14  Yes Osvaldo Shipper, MD  apixaban (ELIQUIS) 5 MG TABS tablet Take 1 tablet (5 mg total) by mouth 2 (two) times daily. 03/30/14  Yes Aundria Rud, NP  atorvastatin (LIPITOR) 20 MG tablet TAKE 1 TABLET BY MOUTH EVERY DAY 05/31/14  Yes [name], MD  carvedilol (COREG) 6.25 MG tablet Take 6.25 mg by mouth 2 (two) times daily with a meal.  02/01/14  Yes Historical Provider, MD  dextromethorphan-guaiFENesin (MUCINEX DM) 30-600 MG per 12 hr tablet Take 1 tablet by mouth 2 (two) times daily as needed for cough.   Yes Historical Provider, MD  furosemide (LASIX) 40 MG tablet Take 40 mg by mouth 2 BID   Yes Historical Provider, MD    guaiFENesin-dextromethorphan (ROBITUSSIN DM) 100-10 MG/5ML syrup Take 5 mLs by mouth every 4 (four) hours as needed for cough.   Yes Historical Provider, MD  isosorbide mononitrate (IMDUR) 30 MG 24 hr tablet Take 1 tablet (30 mg total) by mouth daily. 08/23/14  Yes Hollice Espy, MD  lisinopril (PRINIVIL,ZESTRIL) 5 MG tablet TAKE 1 TABLET BY MOUTH EVERY DAY 05/31/14  Yes [name], MD  spironolactone (ALDACTONE) 25 MG tablet Take 1 tablet (25 mg total) by mouth daily. 05/10/14  Yes [name], MD   Review  of Systems  He denies chest pain, palpitations, dyspnea, pnd, orthopnea, n, v, dizziness, syncope,or early satiety.  He did have edema and wt gain earlier this week but this has resolved with an increase in lasix dose.  All other systems reviewed and are otherwise negative except as noted above.  Physical Exam  VS:  BP 98/52 mmHg  Pulse 61  Ht  (1.778 m)  Wt 155 lb 12.8 oz (70.67 kg)  BMI 22.35 kg/m2  SpO2 95% , BMI Body mass index is 22.35 kg/(m^2). GEN: Well nourished, well developed, in no acute distress. HEENT: normal. Neck: Supple, no JVD, carotid bruits, or masses. Cardiac: RRR, no murmurs, rubs, or gallops. No clubbing, cyanosis, edema.  Radials/DP/PT 2+ and equal bilaterally.  Respiratory:  Respirations regular and unlabored, diminished breath sounds bilat. GI: Soft, nontender, nondistended, BS + x 4. MS: no deformity or atrophy. Skin: warm and dry, no rash. Neuro:  Strength and sensation are intact. Psych: Normal affect.  Accessory Clinical Findings  ECG - V paced, 82.  Assessment & Plan  1.  Mixed Ischemic and Nonischemic Cardiomyopathy/Chronic Systolic CHF:  Pt recently admitted to [facility] 2/2 NSTEMI, which has been medically managed.  Following discharge, he developed weight gain and edema and was seen in the ED over the weekend and advised to increase his lasix to 40 mg BID. Since doing so, his weight has been stable and he has had no dyspnea or  edema.  We had a long discussion about his diet.  He isn't adding salt to food but often has salted meats on a daily basis along with potato chips.  I have advised that he and his dtr be very careful with and avoid processed foods as these are likely the biggest reason he is running into issues with swelling and now requires a higher dose of lasix.  I will not change his lasix dose today but will check a bmet to make sure that he isn't getting dry.  OTW continue bb, acei, and spiro.  2.  NSTEMI, subsequent episode of care/CAD:  S/P recent admission and cath revealing moderate ostial lcx dzs involving the distal LM.  The area is felt to pose a high risk for PCI given LM involvement and possibility of jeopardizing the LAD.  He is a poor CABG candidate 2/2 low EF.  Fortunately, he has not been having any chest pain or dyspnea.  He remains on bb, statin, and nitrate and is tolerating well.  He is not on ASA 2/2 chronic eliquis anticoagulation for afib.  3.  Afib:  S/P BiV ICD and AVN ablation.  On eliquis.  4.  Tob Abuse:  Now smoking 3ppd. Complete cessation advised.  5.  ETOH Abuse:  Has not had a drink since discharge.  Continued cessation encouraged.  6.  HTN: stable.  7.  HL:  Cont statin.  8.  Dispo:  F/U in 2-3 wks either with [place] or in [facility]F Clinic if available.    Nicolasa Ducking, NP 08/30/2014, 1:52 PM

## 2014-09-02 ENCOUNTER — Other Ambulatory Visit (HOSPITAL_COMMUNITY): Payer: Self-pay | Admitting: Cardiology

## 2014-09-03 NOTE — Telephone Encounter (Signed)
Ok Anis, NP at 08/30/2014 1:52 PM furosemide (LASIX) 40 MG tabletTake 40 mg by mouth 2 BID Assessment & Plan  1. Mixed Ischemic and Nonischemic Cardiomyopathy/Chronic Systolic CHF: Pt recently admitted to [facility] 2/2 NSTEMI, which has been medically managed. Following discharge, he developed weight gain and edema and was seen in the ED over the weekend and advised to increase his lasix to 40 mg BID. Since doing so, his weight has been stable and he has had no dyspnea or edema.   Patient Instructions     Your physician recommends that you continue on your current medications as directed. Please refer to the Current Medication list given to you today

## 2014-09-04 ENCOUNTER — Encounter: Payer: Self-pay | Admitting: Vascular Surgery

## 2014-09-05 ENCOUNTER — Telehealth (HOSPITAL_COMMUNITY): Payer: Self-pay | Admitting: Vascular Surgery

## 2014-09-05 ENCOUNTER — Encounter: Payer: Self-pay | Admitting: Vascular Surgery

## 2014-09-05 ENCOUNTER — Ambulatory Visit (HOSPITAL_COMMUNITY)
Admission: RE | Admit: 2014-09-05 | Discharge: 2014-09-05 | Disposition: A | Discharge status: Home / Self Care | Payer: Commercial Managed Care - HMO | Source: Ambulatory Visit | Attending: Vascular Surgery | Admitting: Vascular Surgery

## 2014-09-05 ENCOUNTER — Ambulatory Visit (INDEPENDENT_AMBULATORY_CARE_PROVIDER_SITE_OTHER): Payer: Commercial Managed Care - HMO | Admitting: Vascular Surgery

## 2014-09-05 VITALS — BP 118/68 | HR 78 | Temp 97.5°F | Resp 14 | Ht 67.0 in | Wt 153.0 lb

## 2014-09-05 DIAGNOSIS — I739 Peripheral vascular disease, unspecified: Secondary | ICD-10-CM

## 2014-09-05 DIAGNOSIS — I70209 Unspecified atherosclerosis of native arteries of extremities, unspecified extremity: Secondary | ICD-10-CM

## 2014-09-05 NOTE — Progress Notes (Signed)
[facility] [place]  Patient name: [name] MRN: [id number] DOB: December 11, 1945 Sex: male  REASON FOR VISIT: Follow up of peripheral vascular disease  HPI: [name] is a 69 y.o. male who I last saw on 02/28/2014 with bilateral leg pain. Doppler studies at that time showed an ABI on the right of 57% and an ABI on the left at 51%. Based on his exam, he had evidence of infrainguinal arterial occlusive disease bilaterally. At that time we discussed the importance of tobacco cessation. I encouraged him to stay as active as possible. He was on statin and was on aspirin. Set him up for a 6 month follow up visit.  Since I saw him last, he denies any history of claudication or rest pain. However, I suspect that his activity is very limited. He has a history of significant congestive heart failure with an EF of 20% and was told not to perform any strenuous activity. Unfortunately, he tells me that he smokes 3 packs per day of cigarettes. He used to work for a tobacco company.  He denies any recent chest pain. He does have a chronic cough Chi contributes to taking lisinopril.  Past Medical History  Diagnosis Date  . DVT (deep venous thrombosis)   . Mitral valve disease     a. remote mitral replacement with Bjork Shiley valve;  b. 06/2006 Redo MVR with tissue valve.  Marland Kitchen COPD (chronic obstructive pulmonary disease)   . Depression   . Hypertension   . Dyslipidemia   . Ventral hernia   . CAD (coronary artery disease)     a. s/p prior PCI/stenting of the LCX;  b. 08/2014 MV: EF 20%, large septal, apical, and inferior infarct from apex to base, no ischemia;  c. 08/2014 NSTEMI/Cath: LM mod distal dzs extending into ostial LCX (80%), LAD tortuous, RI nl, OM mod dzs, RCA 100 CTO with R->R and L->R collats-->Med Rx.  . Mixed Ischemic and Nonischemic Cardiomyopathy     a. 8/.2015 Echo: Ef 25-30%;  b. 01/2014 s/p MDT [id number] Talbot Grumbling XT CRT-D (ser # [id number]).  . Chronic systolic  congestive heart failure     a. 01/2014 Echo: EF 25-30%, mod conc LVH, mod dil LA.  . Bipolar disorder   . Anxiety   . Persistent atrial fibrillation     a. CHA2DS2VASc = 6->eliquis;  b. S/P AVN RFCA an BiV ICD placement.  . Alcohol abuse   . Complete heart block     a. In setting of prior AV nodal ablation r/t afib-->BiV ICD (01/2014).  . Stroke   . CVA (cerebral vascular accident)     a. Multiple prior embolic strokes.  . Biventricular automatic implantable cardioverter defibrillator in situ     a. 01/2014 s/p MDT [id number] Viva XT CRT-D (ser # [id number]).   Family History  Problem Relation Age of Onset  . Stroke    . Heart disease    . Alzheimer's disease Mother   . Heart attack Father   . Heart attack Son   . Varicose Veins Son   . Deep vein thrombosis Son    SOCIAL HISTORY: History  Substance Use Topics  . Smoking status: Current Every Day Smoker -- 2.00 packs/day    Types: Cigarettes    Last Attempt to Quit: 01/30/2003  . Smokeless tobacco: Never Used  . Alcohol Use: 3.6 oz/week    6 Standard drinks or equivalent per week     Comment: occasionally  Allergies  Allergen Reactions  . Warfarin Other (See Comments)    Non compliance and ETOH abuse   Current Outpatient Prescriptions  Medication Sig Dispense Refill  . albuterol-ipratropium (COMBIVENT) 18-103 MCG/ACT inhaler Inhale 2 puffs into the lungs every 4 (four) hours as needed for wheezing or shortness of breath. 1 Inhaler 0  . apixaban (ELIQUIS) 5 MG TABS tablet Take 1 tablet (5 mg total) by mouth 2 (two) times daily. 60 tablet 3  . atorvastatin (LIPITOR) 20 MG tablet TAKE 1 TABLET BY MOUTH EVERY DAY 30 tablet 3  . carvedilol (COREG) 6.25 MG tablet Take 6.25 mg by mouth 2 (two) times daily with a meal.     . dextromethorphan-guaiFENesin (MUCINEX DM) 30-600 MG per 12 hr tablet Take 1 tablet by mouth 2 (two) times daily as needed for cough.    . furosemide (LASIX) 40 MG tablet Take 40 mg by mouth 2 (two) times daily.  Patient taking by mouth one (40 mg) tab in the morning and one half tablet (20 mg) in the pm    . guaiFENesin-dextromethorphan (ROBITUSSIN DM) 100-10 MG/5ML syrup Take 5 mLs by mouth every 4 (four) hours as needed for cough.    . isosorbide mononitrate (IMDUR) 30 MG 24 hr tablet Take 1 tablet (30 mg total) by mouth daily. 30 tablet 5  . lisinopril (PRINIVIL,ZESTRIL) 5 MG tablet TAKE 1 TABLET BY MOUTH EVERY DAY 30 tablet 3  . spironolactone (ALDACTONE) 25 MG tablet Take 1 tablet (25 mg total) by mouth daily. 30 tablet 6   No current facility-administered medications for this visit.   REVIEW OF SYSTEMS: Arly.Keller ] denotes positive finding; [  ] denotes negative finding  CARDIOVASCULAR:   chest pain    chest pressure    palpitations   Arly.Keller ] orthopnea    dyspnea on exertion    claudication    rest pain   Arly.Keller ] DVT    phlebitis PULMONARY:    productive cough    asthma    wheezing NEUROLOGIC:    weakness   paresthesias   aphasia   amaurosis   dizziness HEMATOLOGIC:    bleeding problems    clotting disorders MUSCULOSKELETAL:   joint pain    joint swelling Arly.Keller ] leg swelling GASTROINTESTINAL:   blood in stool    hematemesis GENITOURINARY:    dysuria    hematuria PSYCHIATRIC:   history of major depression INTEGUMENTARY:   rashes   ulcers CONSTITUTIONAL:   fever    chills  PHYSICAL EXAM: Filed Vitals:   09/05/14 1336  BP: 118/68  Pulse: 78  Temp: 97.5 F (36.4 C)  TempSrc: Oral  Resp: 14  Height:  (1.702 m)  Weight: 153 lb (69.4 kg)  SpO2: 98%   GENERAL: The patient is a well-nourished male, in no acute distress. The vital signs are documented above. CARDIOVASCULAR: There is a regular rate and rhythm. I do not detect carotid bruits. On the left side, he has a palpable femoral pulse. I cannot palpate a popliteal or pedal pulse on the left. On the right side he has a palpable femoral and popliteal pulse. I cannot  palpate pedal pulses on the right. His feet appear adequately perfused. There is no significant lower extremity swelling. PULMONARY: There is good air exchange bilaterally without wheezing or rales. ABDOMEN: Soft  and non-tender with normal pitched bowel sounds.  MUSCULOSKELETAL: There are no major deformities or cyanosis. NEUROLOGIC: No focal weakness or paresthesias are detected. SKIN: There are no ulcers or rashes noted. PSYCHIATRIC: The patient has a normal affect.  DATA:  I have independently interpreted his arterial Doppler study today which shows that he has an ABI of 65% on the right and 60% on the left. These are stable. Toe pressure on the right is 49 mmHg. The pressure on the left is 26 mmHg.  MEDICAL ISSUES:  PERIPHERAL VASCULAR DISEASE: The patient has stable peripheral vascular disease.  We have again had a long discussion about the importance of tobacco cessation. I spent more than 3 minutes discussing this. I also encouraged him to stay as active as possible. Based on his exam he has significant infrainguinal arterial occlusive disease but has minimal symptoms because of his limited activity. He would be at high-risk for intervention given his cardiac history. I've ordered follow up ABIs in 1 year and I'll see him back at that time. He knows to call sooner if he has problems.  Return in about 1 year (around 09/05/2015).  DICKSON,CHRISTOPHER S [facility] [place] [facility]per: [phone]

## 2014-09-06 ENCOUNTER — Other Ambulatory Visit: Payer: Self-pay | Admitting: *Deleted

## 2014-09-06 MED ORDER — LOSARTAN POTASSIUM 25 MG PO TABS: 25.0000 mg | ORAL_TABLET | Freq: Every day | ORAL | Status: DC

## 2014-09-06 NOTE — Addendum Note (Signed)
Addended by: Sharee Pimple on: 09/06/2014 10:03 AM   Modules accepted: Orders

## 2014-09-10 ENCOUNTER — Telehealth: Payer: Self-pay | Admitting: Nurse Practitioner

## 2014-09-10 ENCOUNTER — Telehealth: Payer: Self-pay | Admitting: Internal Medicine

## 2014-09-10 NOTE — Telephone Encounter (Signed)
LMOVM for pt daughter to return call.  

## 2014-09-10 NOTE — Telephone Encounter (Signed)
Spoke w/ pt daughter and informed her that all she needed to do was unplug monitor and plug monitor back up at their destination. Pt daughter verbalized understanding.

## 2014-09-10 NOTE — Telephone Encounter (Signed)
Error

## 2014-09-10 NOTE — Telephone Encounter (Signed)
New message      Pt is going to [facility] for 1 week.  They are taking the box with them.  Should they call you when they get to [facility] and should they set up the box any different?

## 2014-09-18 ENCOUNTER — Ambulatory Visit (INDEPENDENT_AMBULATORY_CARE_PROVIDER_SITE_OTHER): Payer: Commercial Managed Care - HMO | Admitting: Nurse Practitioner

## 2014-09-18 ENCOUNTER — Encounter: Payer: Self-pay | Admitting: Nurse Practitioner

## 2014-09-18 VITALS — BP 104/62 | HR 62 | Ht 67.0 in | Wt 157.4 lb

## 2014-09-18 DIAGNOSIS — Z720 Tobacco use: Secondary | ICD-10-CM

## 2014-09-18 DIAGNOSIS — E785 Hyperlipidemia, unspecified: Secondary | ICD-10-CM

## 2014-09-18 DIAGNOSIS — I222 Subsequent non-ST elevation (NSTEMI) myocardial infarction: Secondary | ICD-10-CM

## 2014-09-18 DIAGNOSIS — I5022 Chronic systolic (congestive) heart failure: Secondary | ICD-10-CM | POA: Diagnosis not present

## 2014-09-18 DIAGNOSIS — I255 Ischemic cardiomyopathy: Secondary | ICD-10-CM | POA: Diagnosis not present

## 2014-09-18 DIAGNOSIS — I4891 Unspecified atrial fibrillation: Secondary | ICD-10-CM | POA: Diagnosis not present

## 2014-09-18 DIAGNOSIS — I10 Essential (primary) hypertension: Secondary | ICD-10-CM

## 2014-09-18 DIAGNOSIS — I214 Non-ST elevation (NSTEMI) myocardial infarction: Secondary | ICD-10-CM

## 2014-09-18 NOTE — Patient Instructions (Signed)
Medication Instructions:  Increase lasix to ( 40 mg ) twice a day for 3 days than go back to ( 40 mg ) am and ( 20 mg )  pm  Labwork: None  Testing/Procedures: None  Follow-Up: Keep all follow up  Any Other Special Instructions Will Be Listed Below (If Applicable).

## 2014-09-18 NOTE — Progress Notes (Signed)
Patient Name: [name] Date of Encounter: 09/18/2014  Primary Care Provider:  Cain Saupe, MD Primary Cardiologist:  Golden Circle, MD ([facility])/ G. Ladona Ridgel, MD (EP)  Chief Complaint  69 -year-old male with a history of CAD and ischemic cardiopathy presents for follow-up.  Past Medical History   Past Medical History  Diagnosis Date  . DVT (deep venous thrombosis)   . Mitral valve disease     a. remote mitral replacement with Bjork Shiley valve;  b. 06/2006 Redo MVR with tissue valve.  Marland Kitchen COPD (chronic obstructive pulmonary disease)   . Depression   . Hypertension   . Dyslipidemia   . Ventral hernia   . CAD (coronary artery disease)     a. s/p prior PCI/stenting of the LCX;  b. 08/2014 MV: EF 20%, large septal, apical, and inferior infarct from apex to base, no ischemia;  c. 08/2014 NSTEMI/Cath: LM mod distal dzs extending into ostial LCX (80%), LAD tortuous, RI nl, OM mod dzs, RCA 100 CTO with R->R and L->R collats-->Med Rx.  . Mixed Ischemic and Nonischemic Cardiomyopathy     a. 8/.2015 Echo: Ef 25-30%;  b. 01/2014 s/p MDT [id number] Talbot Grumbling XT CRT-D (ser # [id number]).  . Chronic systolic congestive heart failure     a. 01/2014 Echo: EF 25-30%, mod conc LVH, mod dil LA.  . Bipolar disorder   . Anxiety   . Persistent atrial fibrillation     a. CHA2DS2VASc = 6->eliquis;  b. S/P AVN RFCA an BiV ICD placement.  . Alcohol abuse   . Complete heart block     a. In setting of prior AV nodal ablation r/t afib-->BiV ICD (01/2014).  . Stroke   . CVA (cerebral vascular accident)     a. Multiple prior embolic strokes.  . Biventricular automatic implantable cardioverter defibrillator in situ     a. 01/2014 s/p MDT [id number] Viva XT CRT-D (ser # [id number]).   Past Surgical History  Procedure Laterality Date  . Cystoscopy w/ ureteral stent placement  07/12/2011    Procedure: CYSTOSCOPY WITH RETROGRADE PYELOGRAM/URETERAL STENT PLACEMENT;  Surgeon: Sebastian Ache, MD;  Location: [facility] OR;  Service:  Urology;  Laterality: Left;  . Hernia repair    . Mitral valve replacement      remote [facility] valve 1856 with redo tissue valve 06/2006  . Pacemaker placement  2006    Changed to CRT-D in 2009  . Av node ablation  2007  . Icd generator change  02/01/2014    Gen change to: Medtronic VIVA pulse generator, serial number [id number]  . Cardiac catheterization  06/2006    Mild ostial L main stenosis, CFX stent patent, RCA occluded (old)  . Coronary artery bypass graft  2008  . Pacemaker generator change N/A 02/01/2014    Procedure: PACEMAKER GENERATOR CHANGE;  Surgeon: Duke Salvia, MD;  Location: [facility] CATH LAB;  Service: Cardiovascular;  Laterality: N/A;  . Left heart catheterization with coronary angiogram N/A 08/23/2014    Procedure: LEFT HEART CATHETERIZATION WITH CORONARY ANGIOGRAM;  Surgeon: Corky Crafts, MD;  Location: [facility] CATH LAB;  Service: Cardiovascular;  Laterality: N/A;    Allergies  Allergies  Allergen Reactions  . Warfarin Other (See Comments)    Non compliance and ETOH abuse    HPI  69 y/o male with the above complex problem list. He has a h/o CAD, mixed icm/nicm, chronic systolic chf, persistent afib s/p AVN ablation and BiV ICD placement. He is on chronic eliquis therapy.  He also has a h/o tob abuse (currenlty 3 ppd) and ETOH abuse. He was recently hospitalized on 3/13 with NSTEMI and flat troponin trend. Stress testing was undertaken and was high risk 2/2 low EF and prior infarct w/o active ischemia. Given high risk findings, decision was made to pursue cath. This showed moderate obstructive ostial LCX dzs involving the distal LM. He had moderate dzs in the LAD and a CTO of the RCA with R R and L R collaterals. Decision was made to make an initial attempt @ medical therapy in the setting of LM/LCX dzs where intervention would be high risk and potentially jeopardize the LAD. With severe LV dysfxn, he is not felt to be an ideal candidate for CABG (and has had 2  prior thoracic surgeries r/t MVRs).  I last saw him in clinic on March 24, at which time he was doing fairly well. We talked at that time about quitting smoking (he was smoking 3 packs per day), and also limiting sodium intake as he was eating cold cuts and potato chips every day for lunch. His daughter is with him today. He reports that he continues to feel well without any chest pain or dyspnea on exertion. His daughter reports that they recently traveled to [place] and while there it out almost every day, frequently partaking in fast food. In that setting, he is up 3 pounds. His daughter has noted some increase in his abdominal girth and swelling in his hands though patient says he hasn't noticed any significant changes. His daughter did increase his p.m. dose of Lasix on Sunday in the setting of weight gain and swelling.. While in [place], he was very active and walked sometimes long distances without any significant limitations. He denies PND, orthopnea, dizziness, syncope, edema, or early satiety.  Home Medications  Prior to Admission medications   Medication Sig Start Date End Date Taking? Authorizing Provider  albuterol-ipratropium (COMBIVENT) 18-103 MCG/ACT inhaler Inhale 2 puffs into the lungs every 4 (four) hours as needed for wheezing or shortness of breath. 08/21/14  Yes Osvaldo Shipper, MD  apixaban (ELIQUIS) 5 MG TABS tablet Take 1 tablet (5 mg total) by mouth 2 (two) times daily. 03/30/14  Yes Aundria Rud, NP  atorvastatin (LIPITOR) 20 MG tablet TAKE 1 TABLET BY MOUTH EVERY DAY 05/31/14  Yes [name], MD  benzonatate (TESSALON) 100 MG capsule Take 100 mg by mouth 3 (three) times daily as needed. Taking 1 capsule by mouth three times daily every six to eight hours as needed for cough 08/30/14  Yes Historical Provider, MD  carvedilol (COREG) 6.25 MG tablet Take 6.25 mg by mouth 2 (two) times daily with a meal.  02/01/14  Yes Historical Provider, MD  Chlorpheniramine-DM 4-30 MG  TABS Take 1 tablet by mouth 2 (two) times daily. Patient taking one tablet by mouth daily every 10 - 12 hours for cough   Yes Historical Provider, MD  furosemide (LASIX) 40 MG tablet Take 40 mg by mouth 2 (two) times daily. Patient taking by mouth one (40 mg) tab in the morning and one half tablet (20 mg) in the pm   Yes Historical Provider, MD  isosorbide mononitrate (IMDUR) 30 MG 24 hr tablet Take 1 tablet (30 mg total) by mouth daily. 08/23/14  Yes Hollice Espy, MD  losartan (COZAAR) 25 MG tablet Take 1 tablet (25 mg total) by mouth daily. 09/06/14  Yes [name], MD  spironolactone (ALDACTONE) 25 MG tablet Take 1 tablet (  25 mg total) by mouth daily. 05/10/14  Yes [name], MD    Review of Systems  As above, he reports doing well though over the past few days, his weight has been up about 3 pounds. His daughter has adjusted his p.m. dose of Lasix at home. He denies PND, orthopnea, dizziness, syncope, edema, or early satiety.  All other systems reviewed and are otherwise negative except as noted above.  Physical Exam  VS:  BP 104/62 mmHg  Pulse 62  Ht  (1.702 m)  Wt 157 lb 6.4 oz (71.396 kg)  BMI 24.65 kg/m2 , BMI Body mass index is 24.65 kg/(m^2). GEN: Well nourished, well developed, in no acute distress. HEENT: normal. Neck: Supple, no carotid bruits, or masses.  JVP approximately 12-14. Cardiac: RRR, no murmurs, rubs, or gallops. No clubbing, cyanosis, trace bilateral lower extremity edema.  Radials/DP/PT 2+ and equal bilaterally.  Respiratory:  Respirations regular and unlabored, clear to auscultation bilaterally. GI: Firm and protuberant, nontender, BS + x 4. MS: no deformity or atrophy. Skin: warm and dry, no rash. Neuro:  Strength and sensation are intact. Psych: Normal affect.  Accessory Clinical Findings  ECG - V paced, 62, underlying atrial fibrillation.  Assessment & Plan  1. Mixed Ischemic and Nonischemic Cardiomyopathy/Chronic Systolic CHF:  EF 25-30. Patient does have mild volume overload on exam today with increasing abdominal girth and trace lower extremity edema. His lungs are clear. His weight is up 4 pounds since previous recording in the setting of ongoing noncompliance with diet. He recently traveled to [place] and his daughter reports they ate fast food restaurant type food for nearly every meal. He denies any dyspnea, PND, or orthopnea. I've asked him and his daughter to increase his Lasix to 40 mg twice a day over the next 3 days and then resume his previous prescription of 40 mg in the morning and 20 in the afternoon. We again had a long discussion about the importance of sodium restriction including avoidance of prepackaged, processed, and fast foods.   2. NSTEMI, subsequent episode of care/CAD: S/P admission and cath in mid March revealing moderate ostial lcx dzs involving the distal LM. The area is felt to pose a high risk for PCI given LM involvement and possibility of jeopardizing the LAD. He is a poor CABG candidate 2/2 low EF. Fortunately, he has not been having any chest pain or dyspnea. He remains on bb, statin, and nitrate and is tolerating well. He is not on ASA 2/2 chronic eliquis anticoagulation for afib.  3. Afib: S/P BiV ICD and AVN ablation. On eliquis.  4. Tob Abuse: Now smoking 2 ppd, down from 3 packs a day 2 weeks ago. Complete cessation advised. He shows no interest in quitting.  5. ETOH Abuse: Has not had a drink since discharge. Continued cessation encouraged.  6. HTN: stable.  7. HL: Cont statin.  8. Dispo: Follow-up with Dr. Shirlee Latch in one month or sooner if necessary. As his daughter is pretty savvy in adjusting his Lasix, I have advised that she may increase his p.m. dose of Lasix to 40 mg anytime that his weight is up 2 pounds over the course of 24 hours. If his weight continues to rise or he becomes symptomatically, she is advised to call for advice.  Nicolasa Ducking,  NP 09/18/2014, 12:28 PM

## 2014-09-19 ENCOUNTER — Other Ambulatory Visit: Payer: Self-pay

## 2014-09-19 NOTE — Patient Outreach (Signed)
Telephonic contact made with patient's daughter, who informed this RNCM patient is doing good, did not need [facility] involvement at this present time.  However, daughter did state her father would like assistance with purchasing  combivent medication as she indicated the medication current cost is $200 per month.  Daughter informed by this RN CM a referral would be sent to [facility] pharmacy for follow up.

## 2014-09-25 ENCOUNTER — Other Ambulatory Visit: Payer: Self-pay

## 2014-09-25 NOTE — Patient Outreach (Signed)
I called the patient's number and got an answering machine.  It was not clear if it was his answering machine so I did not leave a message.  I will try calling back in a few days to answer his question regarding his combivent.   Steve Rattler, PharmD, [employer] consultant [phone]

## 2014-09-25 NOTE — Patient Outreach (Signed)
I received a call back from [facility], the patient's daughter.  I stated I had received a referral in regards to answering a question about his medications.  She stated he did not have any questions. I stated if he did he could call back.  She said thank you.   I will close his case since he has no pharmacy needs.    Steve Rattler, PharmD, [employer] [phone]

## 2014-10-02 NOTE — Patient Outreach (Signed)
[facility]) Care Management  10/02/2014  [name] [birth year] [id number]   Received notification from [name], PharmD to close case stating daughter states there are not questions.  [name]. [facility] Care Management [facility] CM Assistant Phone: [phone] Fax: [phone]

## 2014-10-05 ENCOUNTER — Encounter: Payer: Self-pay | Admitting: Internal Medicine

## 2014-10-05 ENCOUNTER — Ambulatory Visit (INDEPENDENT_AMBULATORY_CARE_PROVIDER_SITE_OTHER): Payer: Commercial Managed Care - HMO | Admitting: Internal Medicine

## 2014-10-05 VITALS — BP 134/76 | HR 79 | Ht 64.0 in | Wt 161.4 lb

## 2014-10-05 DIAGNOSIS — I5022 Chronic systolic (congestive) heart failure: Secondary | ICD-10-CM

## 2014-10-05 DIAGNOSIS — Z95810 Presence of automatic (implantable) cardiac defibrillator: Secondary | ICD-10-CM

## 2014-10-05 DIAGNOSIS — I255 Ischemic cardiomyopathy: Secondary | ICD-10-CM

## 2014-10-05 DIAGNOSIS — I442 Atrioventricular block, complete: Secondary | ICD-10-CM

## 2014-10-05 DIAGNOSIS — I5023 Acute on chronic systolic (congestive) heart failure: Secondary | ICD-10-CM

## 2014-10-05 DIAGNOSIS — I482 Chronic atrial fibrillation, unspecified: Secondary | ICD-10-CM

## 2014-10-05 DIAGNOSIS — I2583 Coronary atherosclerosis due to lipid rich plaque: Secondary | ICD-10-CM

## 2014-10-05 DIAGNOSIS — I2510 Atherosclerotic heart disease of native coronary artery without angina pectoris: Secondary | ICD-10-CM

## 2014-10-05 DIAGNOSIS — I4819 Other persistent atrial fibrillation: Secondary | ICD-10-CM

## 2014-10-05 DIAGNOSIS — I481 Persistent atrial fibrillation: Secondary | ICD-10-CM

## 2014-10-05 LAB — MDC_IDC_ENUM_SESS_TYPE_INCLINIC
Battery Remaining Longevity: 91 mo
Brady Statistic AP VP Percent: 20.55 %
Brady Statistic AP VS Percent: 0.02 %
Brady Statistic AS VP Percent: 77.1 %
Brady Statistic AS VS Percent: 2.32 %
Date Time Interrogation Session: 20160429101122
HIGH POWER IMPEDANCE MEASURED VALUE: 56 Ohm
HighPow Impedance: 171 Ohm
HighPow Impedance: 44 Ohm
Lead Channel Impedance Value: 4047 Ohm
Lead Channel Impedance Value: 4047 Ohm
Lead Channel Impedance Value: 760 Ohm
Lead Channel Pacing Threshold Amplitude: 0.625 V
Lead Channel Pacing Threshold Amplitude: 1.5 V
Lead Channel Pacing Threshold Pulse Width: 0.4 ms
Lead Channel Pacing Threshold Pulse Width: 0.4 ms
Lead Channel Sensing Intrinsic Amplitude: 1.375 mV
Lead Channel Sensing Intrinsic Amplitude: 25.5 mV
Lead Channel Sensing Intrinsic Amplitude: 25.5 mV
Lead Channel Setting Pacing Amplitude: 1.5 V
Lead Channel Setting Pacing Amplitude: 2.5 V
Lead Channel Setting Pacing Amplitude: 2.5 V
Lead Channel Setting Pacing Pulse Width: 0.4 ms
Lead Channel Setting Pacing Pulse Width: 0.4 ms
Lead Channel Setting Sensing Sensitivity: 0.45 mV
MDC IDC MSMT BATTERY VOLTAGE: 3 V
MDC IDC MSMT LEADCHNL LV IMPEDANCE VALUE: 399 Ohm
MDC IDC MSMT LEADCHNL LV PACING THRESHOLD PULSEWIDTH: 0.4 ms
MDC IDC MSMT LEADCHNL RA IMPEDANCE VALUE: 570 Ohm
MDC IDC MSMT LEADCHNL RA PACING THRESHOLD AMPLITUDE: 0.5 V
MDC IDC MSMT LEADCHNL RA SENSING INTR AMPL: 1.25 mV
MDC IDC SET ZONE DETECTION INTERVAL: 300 ms
MDC IDC SET ZONE DETECTION INTERVAL: 350 ms
MDC IDC STAT BRADY RA PERCENT PACED: 20.57 %
MDC IDC STAT BRADY RV PERCENT PACED: 96.54 %
Zone Setting Detection Interval: 270 ms
Zone Setting Detection Interval: 350 ms
Zone Setting Detection Interval: 400 ms

## 2014-10-05 NOTE — Patient Instructions (Addendum)
Medication Instructions:  REMINDER: Take Eliquis 5mg  tablet by mouth TWICE a day  Labwork: NONE  Testing/Procedures: NONE  Follow-Up: Your physician wants you to follow-up in: January 2017 with Dr. Ladona Ridgel. You will receive a reminder letter in the mail two months in advance. If you don't receive a letter, please call our office to schedule the follow-up appointment.  Remote monitoring is used to monitor your Pacemaker or ICD from home. This monitoring reduces the number of office visits required to check your device to one time per year. It allows [place] to keep an eye on the functioning of your device to ensure it is working properly. You are scheduled for a device check from home on 01/07/2015. You may send your transmission at any time that day. If you have a wireless device, the transmission will be sent automatically. After your physician reviews your transmission, you will receive a postcard with your next transmission date.

## 2014-10-05 NOTE — Assessment & Plan Note (Signed)
He denies anginal symptoms. He does have significant coronary disease. He will continue his current medications.

## 2014-10-05 NOTE — Assessment & Plan Note (Signed)
He remains in chronic atrial fibrillation/flutter. He will continue his current medications and I have asked the patient to increase his Eliquis to twice a day.

## 2014-10-05 NOTE — Assessment & Plan Note (Signed)
His heart failure symptoms are class II. His fluid index is improved. He will continue his diuretic therapy. I've asked the patient to reduce his salt intake.

## 2014-10-05 NOTE — Assessment & Plan Note (Signed)
His Medtronic biventricular ICD is working normally. Plan to recheck in several months.
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
## 2014-10-12 ENCOUNTER — Ambulatory Visit (HOSPITAL_COMMUNITY)
Admission: RE | Admit: 2014-10-12 | Discharge: 2014-10-12 | Disposition: A | Discharge status: Home / Self Care | Payer: Commercial Managed Care - HMO | Source: Ambulatory Visit | Attending: Internal Medicine | Admitting: Internal Medicine

## 2014-10-12 ENCOUNTER — Encounter (HOSPITAL_COMMUNITY): Payer: Self-pay

## 2014-10-12 VITALS — BP 108/64 | HR 79 | Wt 161.8 lb

## 2014-10-12 DIAGNOSIS — J449 Chronic obstructive pulmonary disease, unspecified: Secondary | ICD-10-CM | POA: Diagnosis not present

## 2014-10-12 DIAGNOSIS — I426 Alcoholic cardiomyopathy: Secondary | ICD-10-CM | POA: Diagnosis not present

## 2014-10-12 DIAGNOSIS — I2510 Atherosclerotic heart disease of native coronary artery without angina pectoris: Secondary | ICD-10-CM

## 2014-10-12 DIAGNOSIS — Z8673 Personal history of transient ischemic attack (TIA), and cerebral infarction without residual deficits: Secondary | ICD-10-CM | POA: Insufficient documentation

## 2014-10-12 DIAGNOSIS — I482 Chronic atrial fibrillation: Secondary | ICD-10-CM | POA: Insufficient documentation

## 2014-10-12 DIAGNOSIS — Z79899 Other long term (current) drug therapy: Secondary | ICD-10-CM | POA: Insufficient documentation

## 2014-10-12 DIAGNOSIS — Z7901 Long term (current) use of anticoagulants: Secondary | ICD-10-CM | POA: Diagnosis not present

## 2014-10-12 DIAGNOSIS — I255 Ischemic cardiomyopathy: Secondary | ICD-10-CM | POA: Diagnosis not present

## 2014-10-12 DIAGNOSIS — I4819 Other persistent atrial fibrillation: Secondary | ICD-10-CM

## 2014-10-12 DIAGNOSIS — I059 Rheumatic mitral valve disease, unspecified: Secondary | ICD-10-CM

## 2014-10-12 DIAGNOSIS — I739 Peripheral vascular disease, unspecified: Secondary | ICD-10-CM | POA: Insufficient documentation

## 2014-10-12 DIAGNOSIS — I481 Persistent atrial fibrillation: Secondary | ICD-10-CM | POA: Diagnosis not present

## 2014-10-12 DIAGNOSIS — E785 Hyperlipidemia, unspecified: Secondary | ICD-10-CM | POA: Diagnosis not present

## 2014-10-12 DIAGNOSIS — F319 Bipolar disorder, unspecified: Secondary | ICD-10-CM | POA: Insufficient documentation

## 2014-10-12 DIAGNOSIS — I509 Heart failure, unspecified: Secondary | ICD-10-CM | POA: Diagnosis not present

## 2014-10-12 DIAGNOSIS — I10 Essential (primary) hypertension: Secondary | ICD-10-CM | POA: Insufficient documentation

## 2014-10-12 DIAGNOSIS — F17200 Nicotine dependence, unspecified, uncomplicated: Secondary | ICD-10-CM | POA: Diagnosis not present

## 2014-10-12 DIAGNOSIS — Z952 Presence of prosthetic heart valve: Secondary | ICD-10-CM | POA: Insufficient documentation

## 2014-10-12 DIAGNOSIS — I5022 Chronic systolic (congestive) heart failure: Secondary | ICD-10-CM | POA: Diagnosis present

## 2014-10-12 LAB — LIPID PANEL
CHOLESTEROL: 141 mg/dL (ref 0–200)
HDL: 37 mg/dL — ABNORMAL LOW (ref >40)
LDL Cholesterol: 44 mg/dL (ref 0–99)
Total CHOL/HDL Ratio: 3.8 RATIO
Triglycerides: 298 mg/dL — ABNORMAL HIGH (ref <150)
VLDL: 60 mg/dL — ABNORMAL HIGH (ref 0–40)

## 2014-10-12 LAB — BASIC METABOLIC PANEL
ANION GAP: 9 (ref 5–15)
BUN: 20 mg/dL (ref 6–20)
CO2: 26 mmol/L (ref 22–32)
Calcium: 9.3 mg/dL (ref 8.9–10.3)
Chloride: 106 mmol/L (ref 101–111)
Creatinine, Ser: 1.03 mg/dL (ref 0.61–1.24)
GFR calc Af Amer: >60 mL/min (ref >60)
Glucose, Bld: 122 mg/dL — ABNORMAL HIGH (ref 70–99)
POTASSIUM: 4.1 mmol/L (ref 3.5–5.1)
Sodium: 141 mmol/L (ref 135–145)

## 2014-10-12 LAB — BRAIN NATRIURETIC PEPTIDE: B NATRIURETIC PEPTIDE 5: 74.5 pg/mL (ref 0.0–100.0)

## 2014-10-12 MED ORDER — SACUBITRIL-VALSARTAN 24-26 MG PO TABS: 1.0000 | ORAL_TABLET | Freq: Two times a day (BID) | ORAL | Status: DC

## 2014-10-12 NOTE — Patient Instructions (Signed)
STOP Lisinopril 36 hours before starting Entresto.  STOP Losartan.  START Entresto 24-26 (1 tablet) twice a day.  Labs today (bmet bnp lipids)  FOLLOW UP in 1 month.
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
## 2014-10-15 ENCOUNTER — Telehealth: Payer: Self-pay

## 2014-10-15 NOTE — Telephone Encounter (Signed)
Prior auth for [employer] 24-26 mg tabs sent to [facility].

## 2014-10-15 NOTE — Telephone Encounter (Signed)
Fax  from [facility] authorizing coverage for Entresto 24-26mg  tabs. Good until  10/15/2015.

## 2014-10-22 ENCOUNTER — Telehealth (HOSPITAL_COMMUNITY): Payer: Self-pay

## 2014-10-22 MED ORDER — FENOFIBRATE 48 MG PO TABS: 48.0000 mg | ORAL_TABLET | Freq: Every day | ORAL | Status: DC

## 2014-10-22 NOTE — Telephone Encounter (Signed)
There should be a generic form of fenofibrate, can we get him the generic?

## 2014-10-22 NOTE — Telephone Encounter (Signed)
Patient's daughter called, states new med prescribed Fenofibrate too expensive ($76 for 90 day supply with [facility]) and he will not fill this.  Any cheaper suggestions?

## 2014-10-22 NOTE — Telephone Encounter (Signed)
Multiple attempts to reach patient, no answer, VMs left.  Per Dr. Shirlee Latch, patient needs to start fenofibrate 48mg  tablet once daily.  Rx sent to both pharmacies listed in chart.  A[facility]

## 2014-10-29 ENCOUNTER — Telehealth: Payer: Self-pay | Admitting: Internal Medicine
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
## 2014-10-29 NOTE — Telephone Encounter (Signed)
New Message   Patients daughter is calling bc she would like to speak to nurse / doctor about her dad the patient. She states that she cannot take care of him anymore and needs help with him/ maybe getting him in a nursing home. Please give patient's daughter a call back.

## 2014-11-11 ENCOUNTER — Other Ambulatory Visit (HOSPITAL_COMMUNITY): Payer: Self-pay | Admitting: Anesthesiology

## 2014-11-12 ENCOUNTER — Encounter (HOSPITAL_COMMUNITY): Payer: Commercial Managed Care - HMO

## 2014-11-14 IMAGING — CR DG CHEST 2V
2 series · 2 of 2 positions shown · non-contrast
Comparison: 05/23/2013

CLINICAL DATA: Shortness of breath, recent pacemaker exchange

EXAM:
CHEST  2 VIEW

[w chest pa]
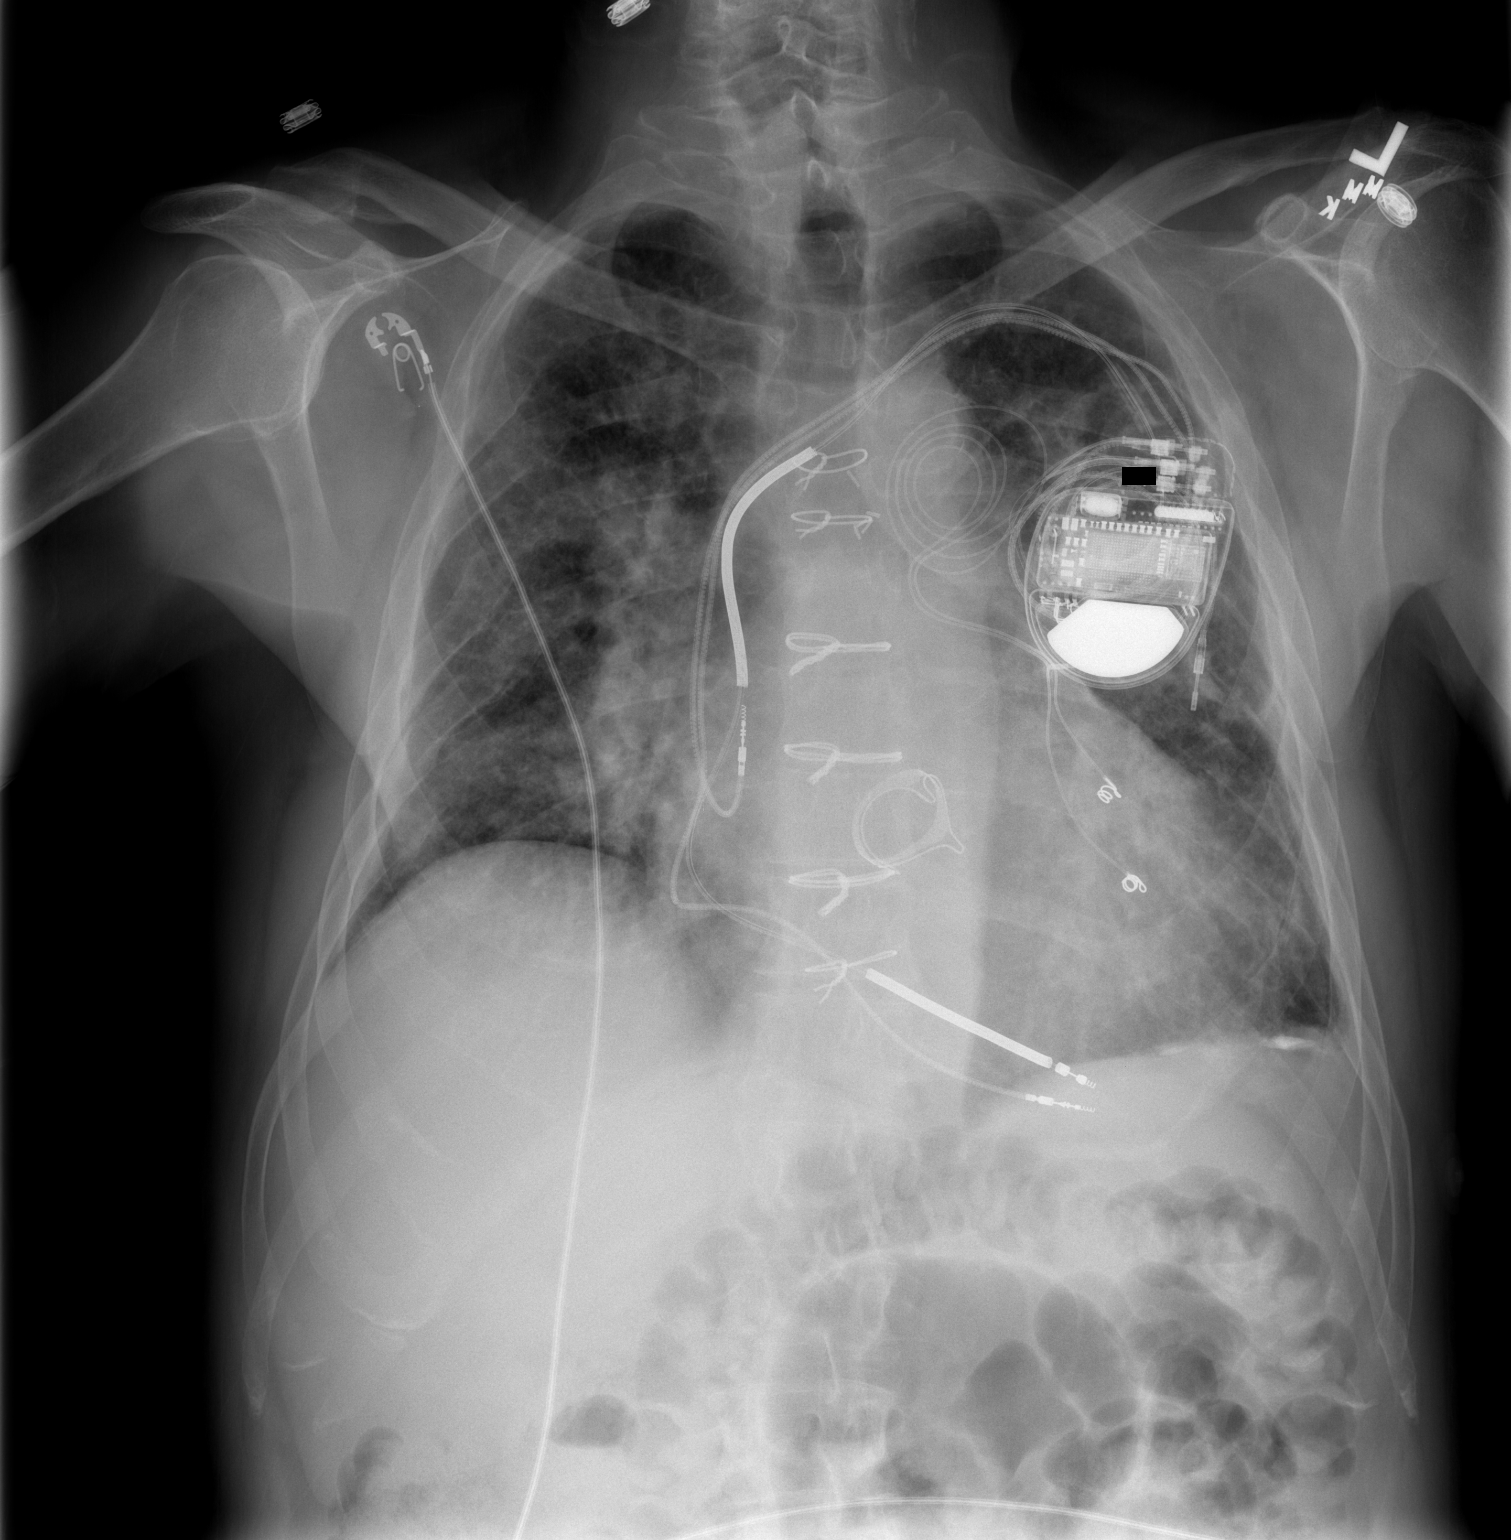

[w chest lat]
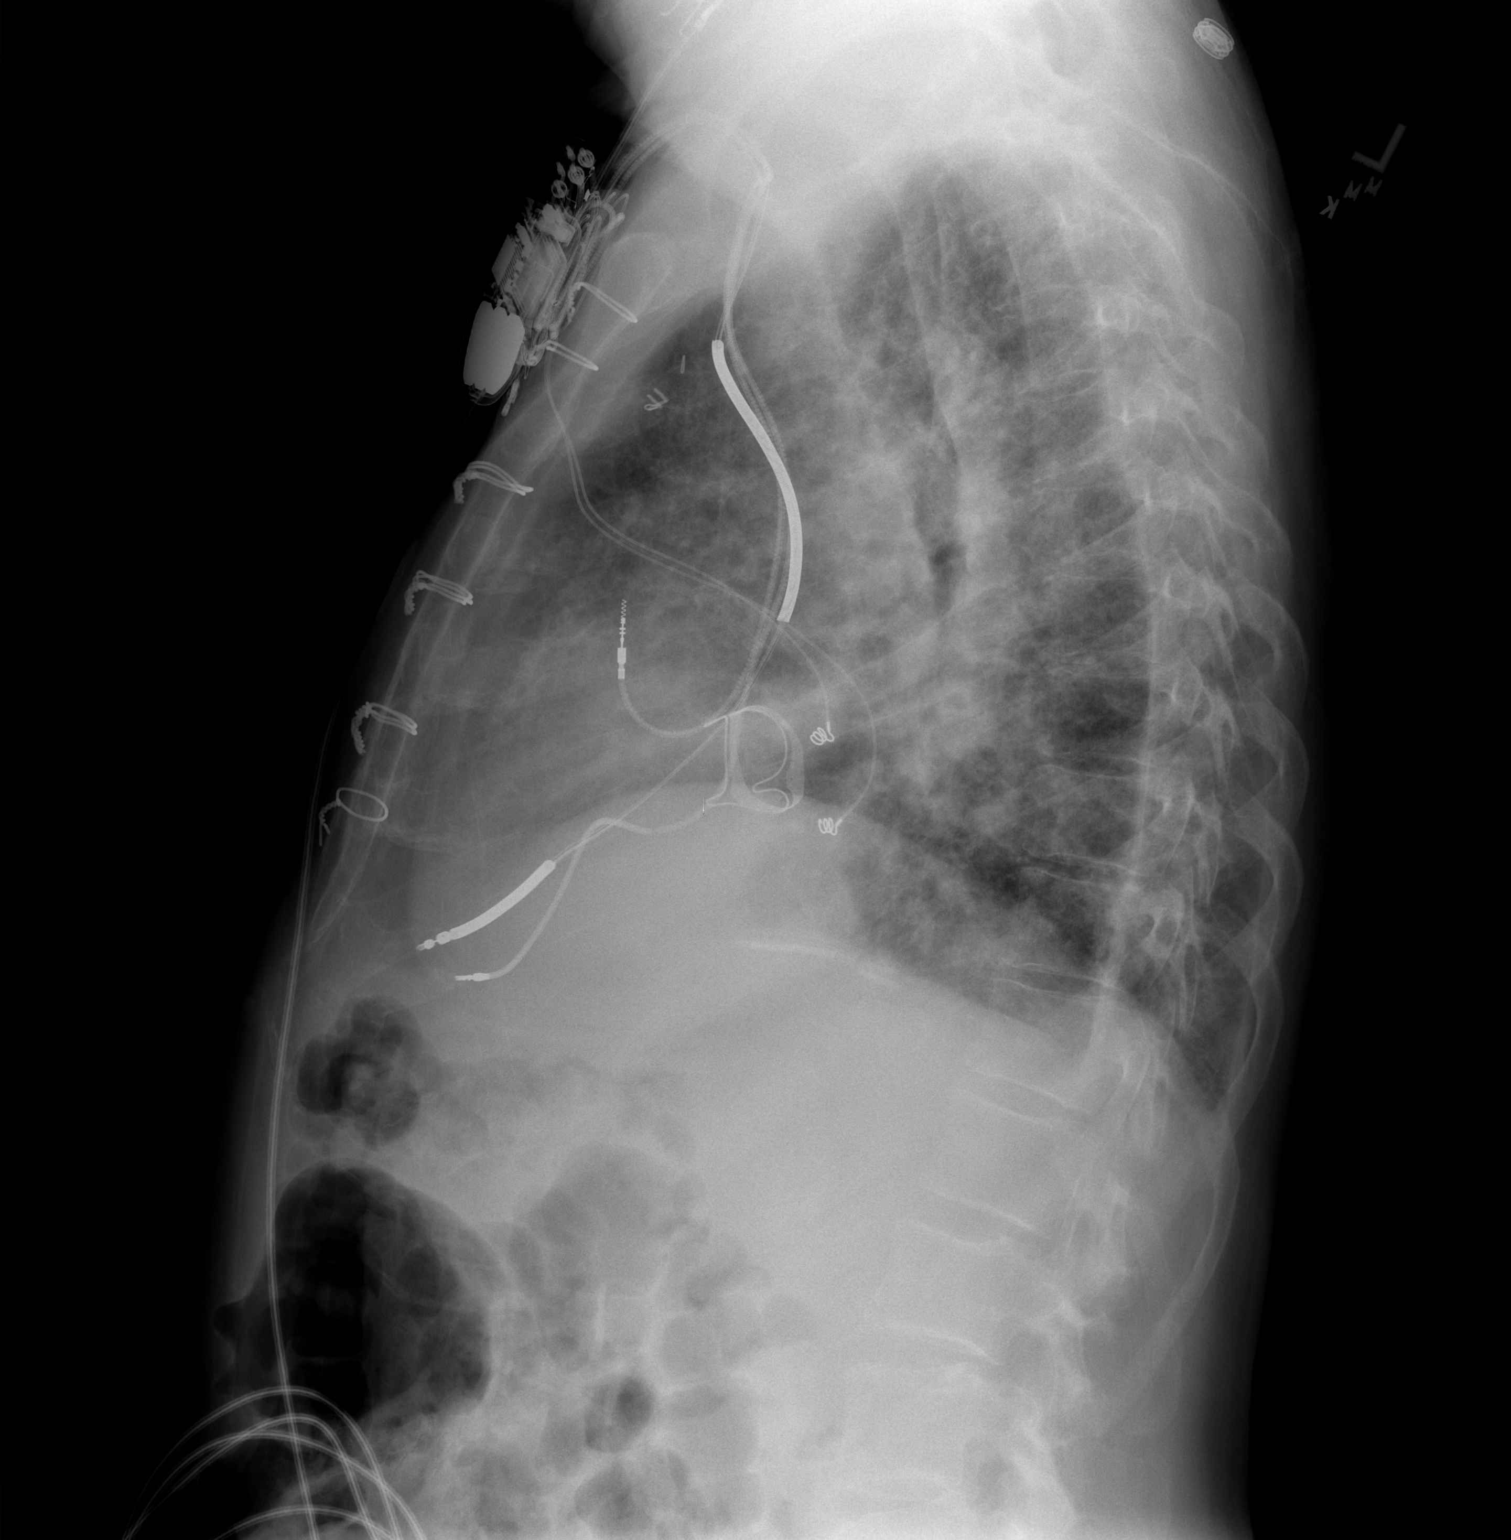

[2 of 2 positions shown; findings below may reference images not displayed]

FINDINGS: Left chest wall battery pack with unchanged lead tip positions.
Status post median sternotomy and valve replacement. Enlarged
cardiac silhouette. Mediastinal contours are prominent, similar.
Elevated right hemidiaphragm. Calcified pleural plaques along the
left lung base. Increased interstitial markings and hazy right lung
airspace opacity. No definite pleural effusion. No pneumothorax.
Sequelae of right clavicle and rib fractures.
IMPRESSION: Prominent cardiomediastinal contours, similar to prior.

Right greater than left interstitial and hazy airspace opacities, in
part chronic with a superimposed interstitial edema or
atypical/viral infection suspected.

## 2014-11-15 IMAGING — CT CT ANGIO CHEST
2 of 9 series · 19 of 46 positions shown · IV contrast (Omni 300)
Comparison: 11/08/2012

CLINICAL DATA: SOB

EXAM:
CT ANGIOGRAPHY CHEST WITH CONTRAST
TECHNIQUE: Multidetector CT imaging of the chest was performed using the
standard protocol during bolus administration of intravenous
contrast. Multiplanar CT image reconstructions and MIPs were
obtained to evaluate the vascular anatomy.
CONTRAST:  80mL OMNIPAQUE IOHEXOL 350 MG/ML SOLN

[Series 5: thins · axial · 0.61mm/px · z∈[+1292,+1546]mm · 16 of 287 slices shown]
[im 16/287  lung]
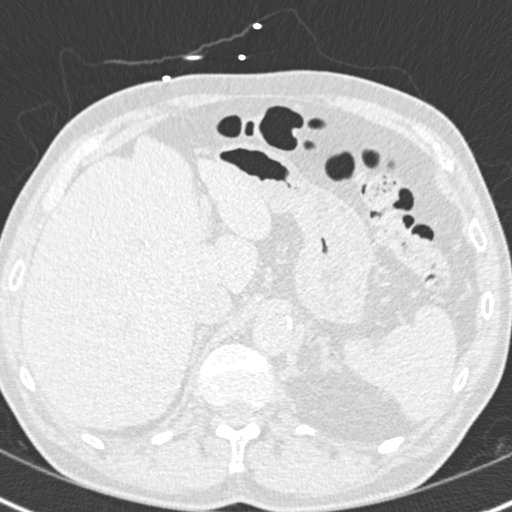
[im 32/287  soft-tissue]
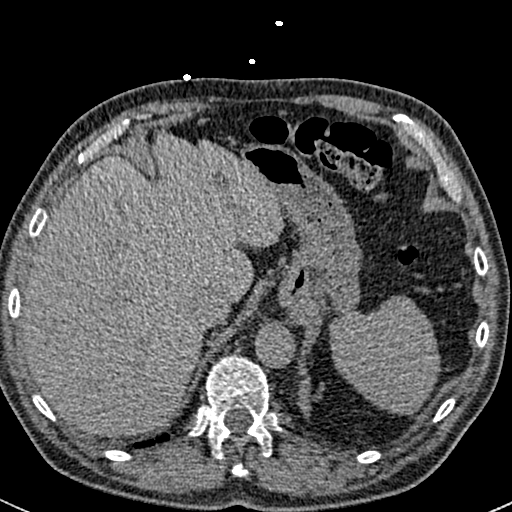
[im 48/287  lung]
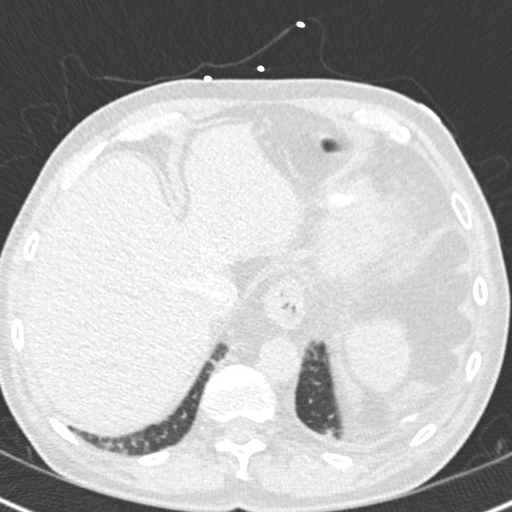
[im 64/287  soft-tissue]
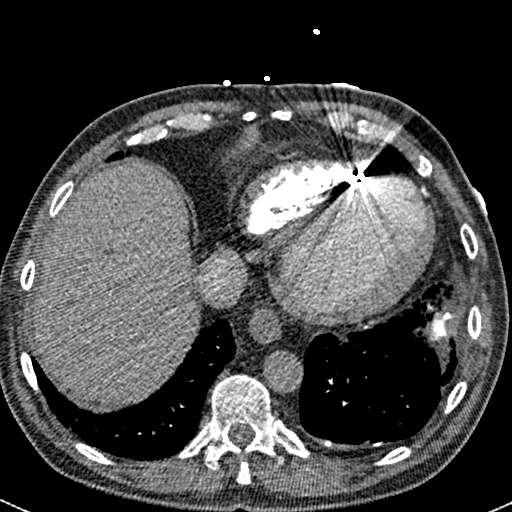
[im 80/287  lung]
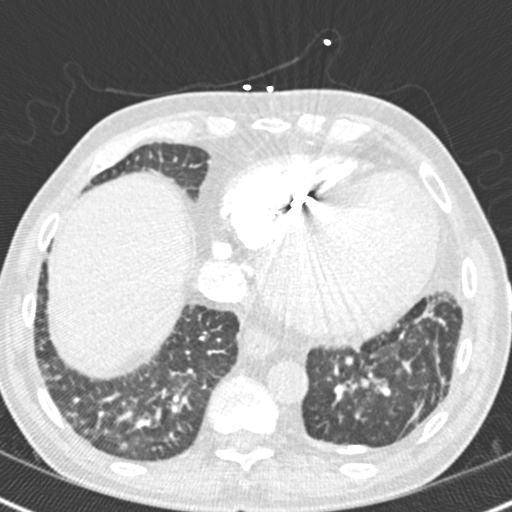
[im 96/287  soft-tissue]
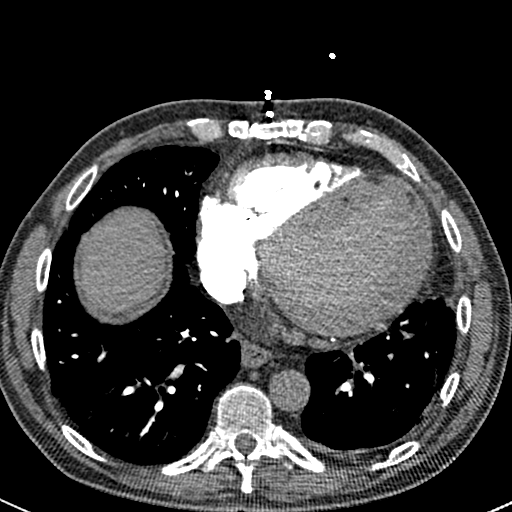
[im 112/287  lung]
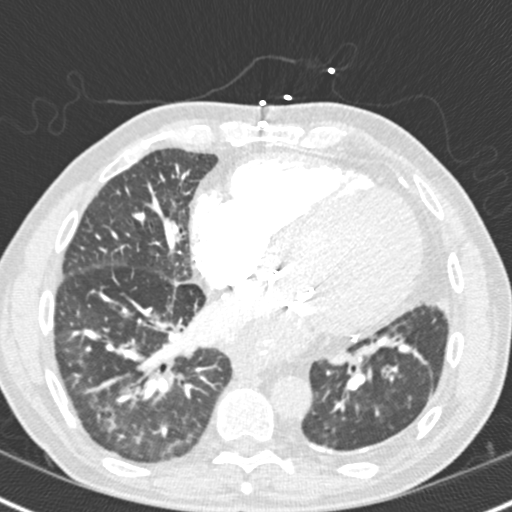
[im 128/287  soft-tissue]
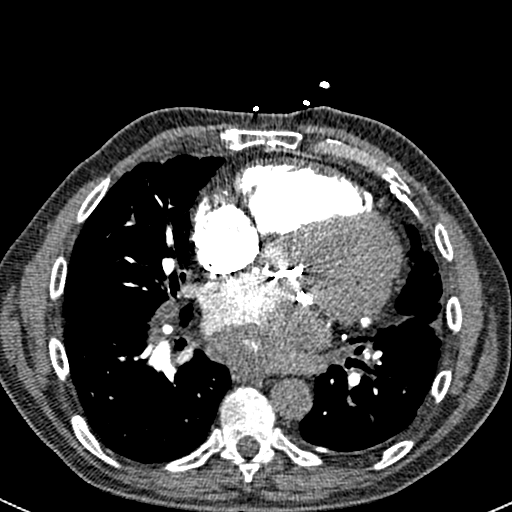
[im 159/287  lung]
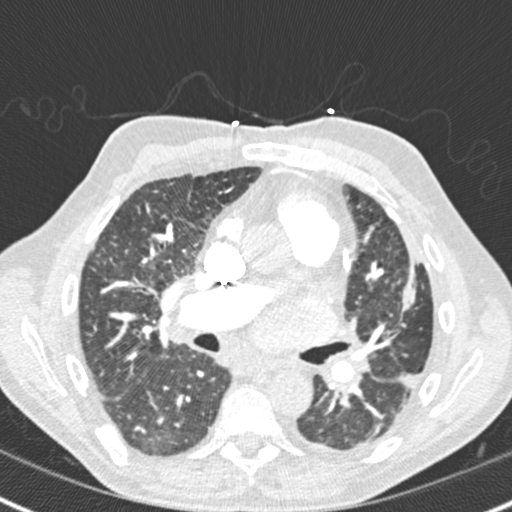
[im 175/287  soft-tissue]
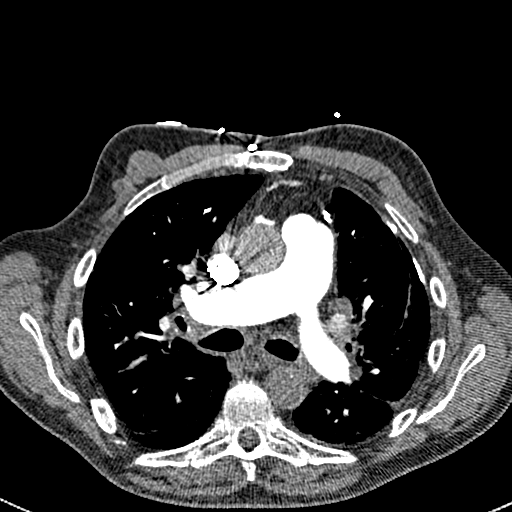
[im 191/287  lung]
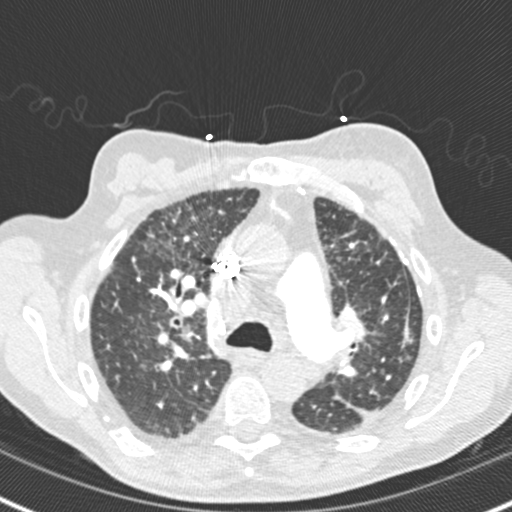
[im 207/287  soft-tissue]
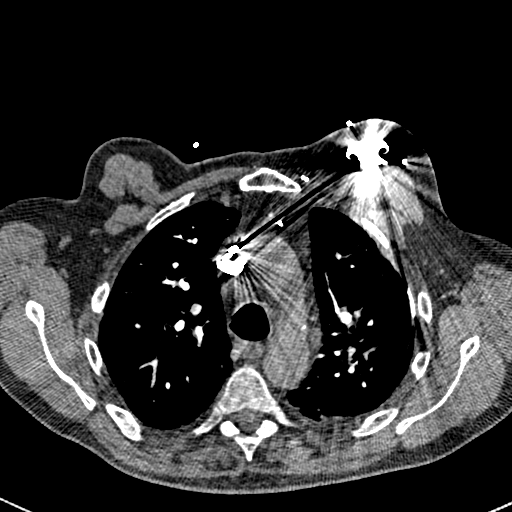
[im 223/287  lung]
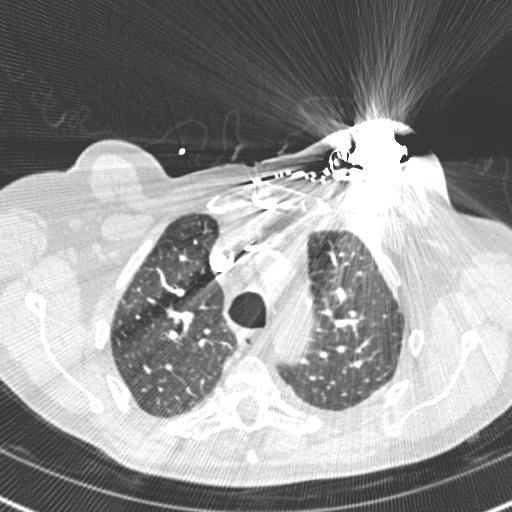
[im 239/287  soft-tissue]
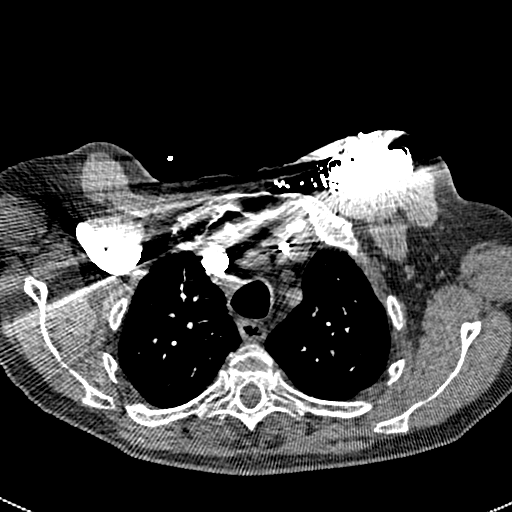
[im 255/287  lung]
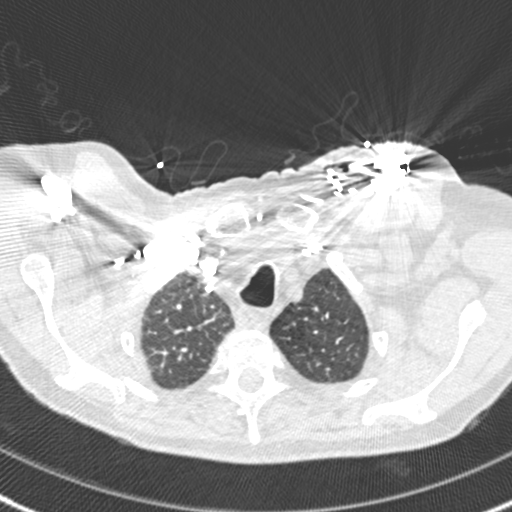
[im 271/287  soft-tissue]
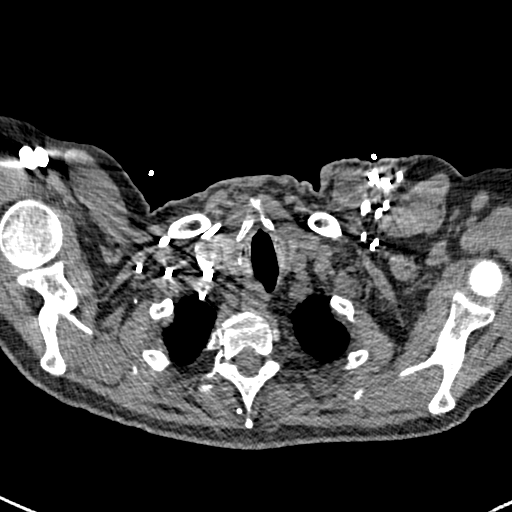

[Series 7: coronal mpr · coronal · 0.59mm/px · 3 of 112 slices shown]
[im 28/112  soft-tissue]
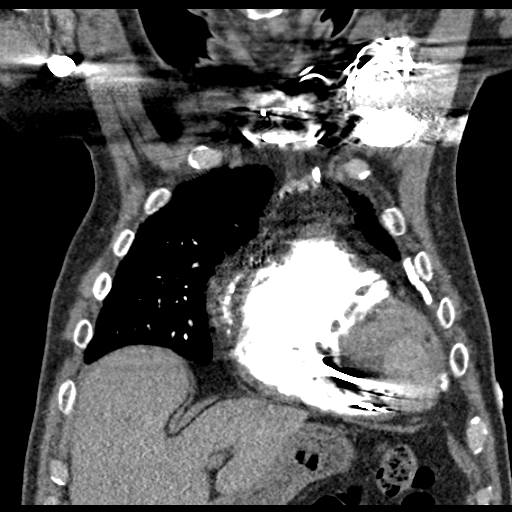
[im 56/112  soft-tissue]
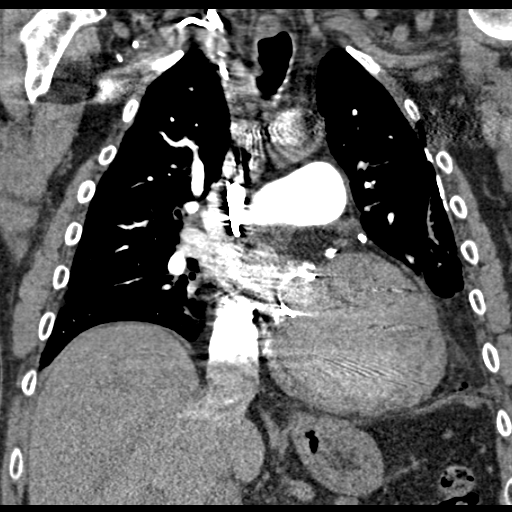
[im 84/112  soft-tissue]
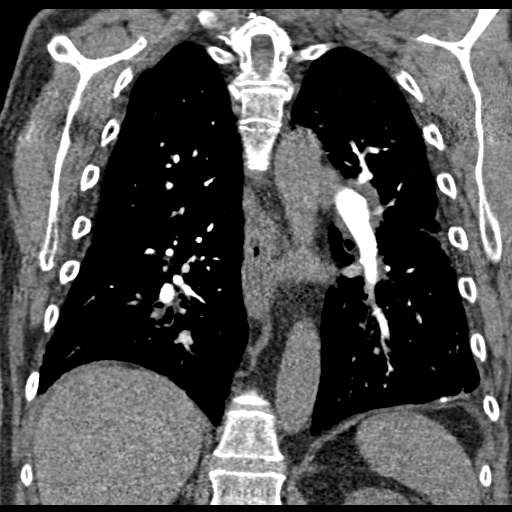

[19 of 46 positions shown; findings below may reference images not displayed]

FINDINGS: Satisfactory opacification of pulmonary arteries noted, and there is
no evidence of pulmonary emboli. The aorta remains largely
non-opacified. Moderate coronary and aortic calcifications. Previous
median sternotomy and MVR. Left subclavian AICD. No pleural or
pericardial effusion. Borderline enlarged sub carinal, bilateral
hilar, and pretracheal lymph nodes. Subcentimeter prevascular and AP
window lymph nodes. Probable filling defect in the left atrium
suggesting mural thrombus. There is dilatation of the left
ventricle. Linear scarring or atelectasis in the lingula. Patchy
ground-glass opacities in basilar segments of both lower lobes.
Visualized portions of upper abdomen unremarkable. Thoracic spine
intact.

Review of the MIP images confirms the above findings.
IMPRESSION: 1. Negative for acute PE.
2. Left atrial enlargement with suspected mural thrombus.
3. Atherosclerosis, including aortic and coronary artery disease.
Please note that although the presence of coronary artery calcium
documents the presence of coronary artery disease, the severity of
this disease and any potential stenosis cannot be assessed on this
non-gated CT examination. Assessment for potential risk factor
modification, dietary therapy or pharmacologic therapy may be
warranted, if clinically indicated.

## 2014-11-15 NOTE — Telephone Encounter (Signed)
Open in error

## 2014-11-19 ENCOUNTER — Encounter: Payer: Self-pay | Admitting: Cardiology

## 2014-12-24 ENCOUNTER — Encounter (HOSPITAL_COMMUNITY): Payer: Self-pay | Admitting: Emergency Medicine

## 2014-12-24 ENCOUNTER — Inpatient Hospital Stay (HOSPITAL_COMMUNITY)
Admission: EM | Admit: 2014-12-24 | Discharge: 2014-12-27 | DRG: 291 | Disposition: A | Discharge status: Home / Self Care | Payer: Commercial Managed Care - HMO | Attending: Internal Medicine | Admitting: Internal Medicine

## 2014-12-24 ENCOUNTER — Emergency Department (HOSPITAL_COMMUNITY): Payer: Commercial Managed Care - HMO

## 2014-12-24 DIAGNOSIS — R0600 Dyspnea, unspecified: Secondary | ICD-10-CM | POA: Diagnosis present

## 2014-12-24 DIAGNOSIS — Z9981 Dependence on supplemental oxygen: Secondary | ICD-10-CM | POA: Diagnosis not present

## 2014-12-24 DIAGNOSIS — N182 Chronic kidney disease, stage 2 (mild): Secondary | ICD-10-CM | POA: Diagnosis present

## 2014-12-24 DIAGNOSIS — I509 Heart failure, unspecified: Secondary | ICD-10-CM

## 2014-12-24 DIAGNOSIS — I639 Cerebral infarction, unspecified: Secondary | ICD-10-CM | POA: Diagnosis present

## 2014-12-24 DIAGNOSIS — F319 Bipolar disorder, unspecified: Secondary | ICD-10-CM | POA: Diagnosis present

## 2014-12-24 DIAGNOSIS — F17210 Nicotine dependence, cigarettes, uncomplicated: Secondary | ICD-10-CM | POA: Diagnosis present

## 2014-12-24 DIAGNOSIS — Z955 Presence of coronary angioplasty implant and graft: Secondary | ICD-10-CM

## 2014-12-24 DIAGNOSIS — F329 Major depressive disorder, single episode, unspecified: Secondary | ICD-10-CM | POA: Diagnosis present

## 2014-12-24 DIAGNOSIS — I5023 Acute on chronic systolic (congestive) heart failure: Principal | ICD-10-CM | POA: Diagnosis present

## 2014-12-24 DIAGNOSIS — Z8249 Family history of ischemic heart disease and other diseases of the circulatory system: Secondary | ICD-10-CM

## 2014-12-24 DIAGNOSIS — R778 Other specified abnormalities of plasma proteins: Secondary | ICD-10-CM | POA: Insufficient documentation

## 2014-12-24 DIAGNOSIS — I513 Intracardiac thrombosis, not elsewhere classified: Secondary | ICD-10-CM | POA: Diagnosis present

## 2014-12-24 DIAGNOSIS — F419 Anxiety disorder, unspecified: Secondary | ICD-10-CM | POA: Diagnosis present

## 2014-12-24 DIAGNOSIS — Z86718 Personal history of other venous thrombosis and embolism: Secondary | ICD-10-CM | POA: Diagnosis not present

## 2014-12-24 DIAGNOSIS — I255 Ischemic cardiomyopathy: Secondary | ICD-10-CM | POA: Diagnosis present

## 2014-12-24 DIAGNOSIS — J9620 Acute and chronic respiratory failure, unspecified whether with hypoxia or hypercapnia: Secondary | ICD-10-CM | POA: Diagnosis present

## 2014-12-24 DIAGNOSIS — Z720 Tobacco use: Secondary | ICD-10-CM

## 2014-12-24 DIAGNOSIS — Z954 Presence of other heart-valve replacement: Secondary | ICD-10-CM

## 2014-12-24 DIAGNOSIS — Z8673 Personal history of transient ischemic attack (TIA), and cerebral infarction without residual deficits: Secondary | ICD-10-CM | POA: Diagnosis not present

## 2014-12-24 DIAGNOSIS — F1010 Alcohol abuse, uncomplicated: Secondary | ICD-10-CM | POA: Diagnosis not present

## 2014-12-24 DIAGNOSIS — Z820 Family history of epilepsy and other diseases of the nervous system: Secondary | ICD-10-CM | POA: Diagnosis not present

## 2014-12-24 DIAGNOSIS — Z823 Family history of stroke: Secondary | ICD-10-CM

## 2014-12-24 DIAGNOSIS — Z79899 Other long term (current) drug therapy: Secondary | ICD-10-CM | POA: Diagnosis not present

## 2014-12-24 DIAGNOSIS — I2510 Atherosclerotic heart disease of native coronary artery without angina pectoris: Secondary | ICD-10-CM | POA: Diagnosis present

## 2014-12-24 DIAGNOSIS — I252 Old myocardial infarction: Secondary | ICD-10-CM | POA: Diagnosis not present

## 2014-12-24 DIAGNOSIS — I481 Persistent atrial fibrillation: Secondary | ICD-10-CM | POA: Diagnosis present

## 2014-12-24 DIAGNOSIS — J438 Other emphysema: Secondary | ICD-10-CM | POA: Diagnosis not present

## 2014-12-24 DIAGNOSIS — Z7902 Long term (current) use of antithrombotics/antiplatelets: Secondary | ICD-10-CM | POA: Diagnosis not present

## 2014-12-24 DIAGNOSIS — Z95810 Presence of automatic (implantable) cardiac defibrillator: Secondary | ICD-10-CM

## 2014-12-24 DIAGNOSIS — E785 Hyperlipidemia, unspecified: Secondary | ICD-10-CM | POA: Diagnosis present

## 2014-12-24 DIAGNOSIS — R7989 Other specified abnormal findings of blood chemistry: Secondary | ICD-10-CM | POA: Insufficient documentation

## 2014-12-24 DIAGNOSIS — I129 Hypertensive chronic kidney disease with stage 1 through stage 4 chronic kidney disease, or unspecified chronic kidney disease: Secondary | ICD-10-CM | POA: Diagnosis present

## 2014-12-24 DIAGNOSIS — I5021 Acute systolic (congestive) heart failure: Secondary | ICD-10-CM | POA: Diagnosis not present

## 2014-12-24 DIAGNOSIS — J42 Unspecified chronic bronchitis: Secondary | ICD-10-CM

## 2014-12-24 DIAGNOSIS — I739 Peripheral vascular disease, unspecified: Secondary | ICD-10-CM | POA: Diagnosis present

## 2014-12-24 DIAGNOSIS — Z9119 Patient's noncompliance with other medical treatment and regimen: Secondary | ICD-10-CM | POA: Diagnosis present

## 2014-12-24 DIAGNOSIS — I442 Atrioventricular block, complete: Secondary | ICD-10-CM | POA: Diagnosis present

## 2014-12-24 DIAGNOSIS — J449 Chronic obstructive pulmonary disease, unspecified: Secondary | ICD-10-CM | POA: Diagnosis present

## 2014-12-24 DIAGNOSIS — I10 Essential (primary) hypertension: Secondary | ICD-10-CM | POA: Diagnosis present

## 2014-12-24 DIAGNOSIS — Z9114 Patient's other noncompliance with medication regimen: Secondary | ICD-10-CM | POA: Diagnosis present

## 2014-12-24 DIAGNOSIS — I4819 Other persistent atrial fibrillation: Secondary | ICD-10-CM | POA: Diagnosis present

## 2014-12-24 DIAGNOSIS — I5022 Chronic systolic (congestive) heart failure: Secondary | ICD-10-CM

## 2014-12-24 DIAGNOSIS — F32A Depression, unspecified: Secondary | ICD-10-CM | POA: Diagnosis present

## 2014-12-24 DIAGNOSIS — R0602 Shortness of breath: Secondary | ICD-10-CM

## 2014-12-24 DIAGNOSIS — J441 Chronic obstructive pulmonary disease with (acute) exacerbation: Secondary | ICD-10-CM

## 2014-12-24 LAB — URINALYSIS, ROUTINE W REFLEX MICROSCOPIC
Bilirubin Urine: NEGATIVE
Glucose, UA: NEGATIVE mg/dL
Hgb urine dipstick: NEGATIVE
Ketones, ur: NEGATIVE mg/dL
Leukocytes, UA: NEGATIVE
Nitrite: NEGATIVE
PH: 5.5 (ref 5.0–8.0)
Protein, ur: NEGATIVE mg/dL
Specific Gravity, Urine: 1.01 (ref 1.005–1.030)
Urobilinogen, UA: 0.2 mg/dL (ref 0.0–1.0)

## 2014-12-24 LAB — BLOOD GAS, ARTERIAL
Acid-base deficit: 1.8 mmol/L (ref 0.0–2.0)
BICARBONATE: 24 meq/L (ref 20.0–24.0)
DRAWN BY: 295031
O2 Content: 2 L/min
O2 SAT: 98.5 %
Patient temperature: 98.6
TCO2: 25.7 mmol/L (ref 0–100)
pCO2 arterial: 53.1 mmHg — ABNORMAL HIGH (ref 35.0–45.0)
pH, Arterial: 7.278 — ABNORMAL LOW (ref 7.350–7.450)
pO2, Arterial: 141 mmHg — ABNORMAL HIGH (ref 80.0–100.0)

## 2014-12-24 LAB — CBC WITH DIFFERENTIAL/PLATELET
BASOS ABS: 0 10*3/uL (ref 0.0–0.1)
BASOS PCT: 0 % (ref 0–1)
EOS ABS: 0.3 10*3/uL (ref 0.0–0.7)
Eosinophils Relative: 3 % (ref 0–5)
HCT: 38.3 % — ABNORMAL LOW (ref 39.0–52.0)
HEMOGLOBIN: 12.5 g/dL — AB (ref 13.0–17.0)
Lymphocytes Relative: 20 % (ref 12–46)
Lymphs Abs: 2 10*3/uL (ref 0.7–4.0)
MCH: 29.1 pg (ref 26.0–34.0)
MCHC: 32.6 g/dL (ref 30.0–36.0)
MCV: 89.3 fL (ref 78.0–100.0)
MONOS PCT: 14 % — AB (ref 3–12)
Monocytes Absolute: 1.3 10*3/uL — ABNORMAL HIGH (ref 0.1–1.0)
NEUTROS ABS: 6.2 10*3/uL (ref 1.7–7.7)
NEUTROS PCT: 63 % (ref 43–77)
PLATELETS: 153 10*3/uL (ref 150–400)
RBC: 4.29 MIL/uL (ref 4.22–5.81)
RDW: 14.2 % (ref 11.5–15.5)
WBC: 9.8 10*3/uL (ref 4.0–10.5)

## 2014-12-24 LAB — BASIC METABOLIC PANEL
Anion gap: 10 (ref 5–15)
BUN: 13 mg/dL (ref 6–20)
CHLORIDE: 106 mmol/L (ref 101–111)
CO2: 24 mmol/L (ref 22–32)
Calcium: 9.1 mg/dL (ref 8.9–10.3)
Creatinine, Ser: 1.09 mg/dL (ref 0.61–1.24)
GFR calc Af Amer: >60 mL/min (ref >60)
Glucose, Bld: 144 mg/dL — ABNORMAL HIGH (ref 65–99)
POTASSIUM: 3.9 mmol/L (ref 3.5–5.1)
SODIUM: 140 mmol/L (ref 135–145)

## 2014-12-24 LAB — PROTIME-INR
INR: 1.19 (ref 0.00–1.49)
Prothrombin Time: 15.3 seconds — ABNORMAL HIGH (ref 11.6–15.2)

## 2014-12-24 LAB — TROPONIN I
TROPONIN I: 0.04 ng/mL — AB (ref <0.031)
Troponin I: 0.07 ng/mL — ABNORMAL HIGH (ref <0.031)
Troponin I: 0.07 ng/mL — ABNORMAL HIGH (ref <0.031)
Troponin I: 0.04 ng/mL — ABNORMAL HIGH (ref <0.031)

## 2014-12-24 LAB — BRAIN NATRIURETIC PEPTIDE: B NATRIURETIC PEPTIDE 5: 328.9 pg/mL — AB (ref 0.0–100.0)

## 2014-12-24 LAB — ETHANOL: ALCOHOL ETHYL (B): 8 mg/dL — AB (ref <5)

## 2014-12-24 MED ORDER — APIXABAN 5 MG PO TABS
5.0000 mg | ORAL_TABLET | Freq: Two times a day (BID) | ORAL | Status: DC
  Administered 2014-12-24 – 2014-12-27 (×7): 5 mg via ORAL
  Filled 2014-12-24 (×8): qty 1

## 2014-12-24 MED ORDER — SODIUM CHLORIDE 0.9 % IJ SOLN: 3.0000 mL | INTRAMUSCULAR | Status: DC | PRN

## 2014-12-24 MED ORDER — NITROGLYCERIN 0.4 MG SL SUBL
0.4000 mg | SUBLINGUAL_TABLET | SUBLINGUAL | Status: DC | PRN
  Administered 2014-12-24: 0.4 mg via SUBLINGUAL
  Filled 2014-12-24: qty 1

## 2014-12-24 MED ORDER — FOLIC ACID 1 MG PO TABS
1.0000 mg | ORAL_TABLET | Freq: Every day | ORAL | Status: DC
  Administered 2014-12-24 – 2014-12-27 (×4): 1 mg via ORAL
  Filled 2014-12-24 (×4): qty 1

## 2014-12-24 MED ORDER — VITAMIN B-1 100 MG PO TABS
100.0000 mg | ORAL_TABLET | Freq: Every day | ORAL | Status: DC
  Administered 2014-12-24 – 2014-12-27 (×4): 100 mg via ORAL
  Filled 2014-12-24 (×4): qty 1

## 2014-12-24 MED ORDER — ISOSORBIDE MONONITRATE ER 30 MG PO TB24
30.0000 mg | ORAL_TABLET | Freq: Every day | ORAL | Status: DC
  Administered 2014-12-24 – 2014-12-27 (×4): 30 mg via ORAL
  Filled 2014-12-24 (×4): qty 1

## 2014-12-24 MED ORDER — SACUBITRIL-VALSARTAN 24-26 MG PO TABS
1.0000 | ORAL_TABLET | Freq: Two times a day (BID) | ORAL | Status: DC
  Filled 2014-12-24 (×2): qty 1

## 2014-12-24 MED ORDER — LORAZEPAM 1 MG PO TABS: 1.0000 mg | ORAL_TABLET | Freq: Four times a day (QID) | ORAL | Status: AC | PRN

## 2014-12-24 MED ORDER — ACETAMINOPHEN 325 MG PO TABS: 650.0000 mg | ORAL_TABLET | ORAL | Status: DC | PRN

## 2014-12-24 MED ORDER — METHYLPREDNISOLONE SODIUM SUCC 125 MG IJ SOLR
125.0000 mg | Freq: Once | INTRAMUSCULAR | Status: AC
  Administered 2014-12-24: 125 mg via INTRAVENOUS
  Filled 2014-12-24: qty 2

## 2014-12-24 MED ORDER — DM-GUAIFENESIN ER 30-600 MG PO TB12
1.0000 | ORAL_TABLET | Freq: Two times a day (BID) | ORAL | Status: DC
  Administered 2014-12-24 – 2014-12-27 (×7): 1 via ORAL
  Filled 2014-12-24 (×8): qty 1

## 2014-12-24 MED ORDER — CARVEDILOL 6.25 MG PO TABS
6.2500 mg | ORAL_TABLET | Freq: Two times a day (BID) | ORAL | Status: DC
  Filled 2014-12-24 (×3): qty 1

## 2014-12-24 MED ORDER — ADULT MULTIVITAMIN W/MINERALS CH
1.0000 | ORAL_TABLET | Freq: Every day | ORAL | Status: DC
  Administered 2014-12-24 – 2014-12-27 (×4): 1 via ORAL
  Filled 2014-12-24 (×4): qty 1

## 2014-12-24 MED ORDER — FUROSEMIDE 10 MG/ML IJ SOLN
40.0000 mg | Freq: Two times a day (BID) | INTRAMUSCULAR | Status: DC
Start: 2014-12-24 — End: 2014-12-25
  Administered 2014-12-24 (×2): 40 mg via INTRAVENOUS
  Filled 2014-12-24 (×4): qty 4

## 2014-12-24 MED ORDER — LORAZEPAM 2 MG/ML IJ SOLN: 1.0000 mg | Freq: Four times a day (QID) | INTRAMUSCULAR | Status: AC | PRN

## 2014-12-24 MED ORDER — CETYLPYRIDINIUM CHLORIDE 0.05 % MT LIQD
7.0000 mL | Freq: Two times a day (BID) | OROMUCOSAL | Status: DC
  Administered 2014-12-26 – 2014-12-27 (×3): 7 mL via OROMUCOSAL

## 2014-12-24 MED ORDER — SODIUM CHLORIDE 0.9 % IV SOLN: 250.0000 mL | INTRAVENOUS | Status: DC | PRN

## 2014-12-24 MED ORDER — LORAZEPAM 2 MG/ML IJ SOLN: 0.0000 mg | Freq: Four times a day (QID) | INTRAMUSCULAR | Status: AC

## 2014-12-24 MED ORDER — LORAZEPAM 2 MG/ML IJ SOLN: 0.0000 mg | Freq: Two times a day (BID) | INTRAMUSCULAR | Status: DC

## 2014-12-24 MED ORDER — NICOTINE 21 MG/24HR TD PT24
21.0000 mg | MEDICATED_PATCH | Freq: Every day | TRANSDERMAL | Status: DC
  Administered 2014-12-24 – 2014-12-27 (×4): 21 mg via TRANSDERMAL
  Filled 2014-12-24 (×4): qty 1

## 2014-12-24 MED ORDER — MORPHINE SULFATE 2 MG/ML IJ SOLN: 2.0000 mg | INTRAMUSCULAR | Status: DC | PRN

## 2014-12-24 MED ORDER — SODIUM CHLORIDE 0.9 % IJ SOLN
3.0000 mL | Freq: Two times a day (BID) | INTRAMUSCULAR | Status: DC
  Administered 2014-12-24 – 2014-12-27 (×7): 3 mL via INTRAVENOUS

## 2014-12-24 MED ORDER — ALBUTEROL SULFATE (2.5 MG/3ML) 0.083% IN NEBU: 2.5000 mg | INHALATION_SOLUTION | Freq: Four times a day (QID) | RESPIRATORY_TRACT | Status: DC | PRN

## 2014-12-24 MED ORDER — FENOFIBRATE 54 MG PO TABS
54.0000 mg | ORAL_TABLET | Freq: Every day | ORAL | Status: DC
  Administered 2014-12-24 – 2014-12-27 (×4): 54 mg via ORAL
  Filled 2014-12-24 (×4): qty 1

## 2014-12-24 MED ORDER — HYDRALAZINE HCL 20 MG/ML IJ SOLN: 5.0000 mg | INTRAMUSCULAR | Status: DC | PRN

## 2014-12-24 MED ORDER — SPIRONOLACTONE 25 MG PO TABS
25.0000 mg | ORAL_TABLET | Freq: Every day | ORAL | Status: DC
  Administered 2014-12-24 – 2014-12-27 (×4): 25 mg via ORAL
  Filled 2014-12-24 (×4): qty 1

## 2014-12-24 MED ORDER — ATORVASTATIN CALCIUM 20 MG PO TABS
20.0000 mg | ORAL_TABLET | Freq: Every day | ORAL | Status: DC
  Administered 2014-12-24 – 2014-12-26 (×3): 20 mg via ORAL
  Filled 2014-12-24 (×4): qty 1

## 2014-12-24 MED ORDER — ALBUTEROL SULFATE (2.5 MG/3ML) 0.083% IN NEBU: 2.5000 mg | INHALATION_SOLUTION | RESPIRATORY_TRACT | Status: DC | PRN

## 2014-12-24 MED ORDER — IPRATROPIUM-ALBUTEROL 0.5-2.5 (3) MG/3ML IN SOLN
3.0000 mL | Freq: Three times a day (TID) | RESPIRATORY_TRACT | Status: DC
  Administered 2014-12-25 – 2014-12-26 (×4): 3 mL via RESPIRATORY_TRACT
  Filled 2014-12-24 (×4): qty 3

## 2014-12-24 MED ORDER — FUROSEMIDE 10 MG/ML IJ SOLN: 40.0000 mg | Freq: Once | INTRAMUSCULAR | Status: DC

## 2014-12-24 MED ORDER — THIAMINE HCL 100 MG/ML IJ SOLN
100.0000 mg | Freq: Every day | INTRAMUSCULAR | Status: DC
  Filled 2014-12-24 (×4): qty 1

## 2014-12-24 MED ORDER — IPRATROPIUM-ALBUTEROL 0.5-2.5 (3) MG/3ML IN SOLN
3.0000 mL | Freq: Four times a day (QID) | RESPIRATORY_TRACT | Status: DC
  Administered 2014-12-24 (×3): 3 mL via RESPIRATORY_TRACT
  Filled 2014-12-24 (×3): qty 3

## 2014-12-24 MED ORDER — IPRATROPIUM-ALBUTEROL 0.5-2.5 (3) MG/3ML IN SOLN
3.0000 mL | Freq: Once | RESPIRATORY_TRACT | Status: AC
  Administered 2014-12-24: 3 mL via RESPIRATORY_TRACT
  Filled 2014-12-24: qty 3

## 2014-12-24 NOTE — H&P (Signed)
[name] History and Physical  [name]:[id number] DOB: 06/12/1945 DOA: 12/24/2014  Referring physician: ED physician PCP: Cain Saupe, MD  Specialists:   Chief Complaint: Shortness of breath  HPI: [name] is a 69 y.o. male with PMH of hypertension, hyperlipidemia, depression, anxiety, DVT, atrial fibrillation on Eliquis, COPD on home oxygen, CAD, systolic congestive heart failure (EF 25-30%), bipolar disorder, alcohol abuse, tobacco abuse, stroke, complete heart block, s.p of AICD, who presents with SOB.  Patient reports that at about 1 AM, he started having shortness of breath, which has been progressively getting was. He does not have cough, fever or chills. He had one episode of mild chest pain, which has already resolved. He denies any abdominal pain, diarrhea, symptoms for UTI. No unilateral weakness. He has leg edema. He reports that he has not been taking any medications for the past 3 weeks.  In ED, patient was found to have troponin 0.07, WBC 9.8, temperature normal, no tachycardia, electrolytes okay, negative urinalysis, alcohol level 8, pending BNP. Chest x-ray showed bilateral basilar opacity.  Where does patient live?   At home   Can patient participate in ADLs?   Some   Review of Systems:   General: no fevers, chills, no changes in body weight, has poor appetite, has fatigue HEENT: no blurry vision, hearing changes or sore throat Pulm: has dyspnea, no coughing, wheezing CV: had chest pain, no palpitations Abd: no nausea, vomiting, abdominal pain, diarrhea, constipation GU: no dysuria, burning on urination, increased urinary frequency, hematuria  Ext: has leg edema Neuro: no unilateral weakness, numbness, or tingling, no vision change or hearing loss Skin: no rash MSK: No muscle spasm, no deformity, no limitation of range of movement in spin Heme: No easy bruising.  Travel history: No recent long distant travel.  Allergy:  Allergies   Allergen Reactions  . Warfarin Other (See Comments)    Non compliance and ETOH abuse    Past Medical History  Diagnosis Date  . DVT (deep venous thrombosis)   . Mitral valve disease     a. remote mitral replacement with Bjork Shiley valve;  b. 06/2006 Redo MVR with tissue valve.  Marland Kitchen COPD (chronic obstructive pulmonary disease)   . Depression   . Hypertension   . Dyslipidemia   . Ventral hernia   . CAD (coronary artery disease)     a. s/p prior PCI/stenting of the LCX;  b. 08/2014 MV: EF 20%, large septal, apical, and inferior infarct from apex to base, no ischemia;  c. 08/2014 NSTEMI/Cath: LM mod distal dzs extending into ostial LCX (80%), LAD tortuous, RI nl, OM mod dzs, RCA 100 CTO with R->R and L->R collats-->Med Rx.  . Mixed Ischemic and Nonischemic Cardiomyopathy     a. 8/.2015 Echo: Ef 25-30%;  b. 01/2014 s/p MDT [id number] Talbot Grumbling XT CRT-D (ser # [id number]).  . Chronic systolic congestive heart failure     a. 01/2014 Echo: EF 25-30%, mod conc LVH, mod dil LA.  . Bipolar disorder   . Anxiety   . Persistent atrial fibrillation     a. CHA2DS2VASc = 6->eliquis;  b. S/P AVN RFCA an BiV ICD placement.  . Alcohol abuse   . Complete heart block     a. In setting of prior AV nodal ablation r/t afib-->BiV ICD (01/2014).  . Stroke   . CVA (cerebral vascular accident)     a. Multiple prior embolic strokes.  . Biventricular automatic implantable cardioverter defibrillator in situ  a. 01/2014 s/p MDT [id number] Coletta Memos CRT-D (ser # [id number]).    Past Surgical History  Procedure Laterality Date  . Cystoscopy w/ ureteral stent placement  07/12/2011    Procedure: CYSTOSCOPY WITH RETROGRADE PYELOGRAM/URETERAL STENT PLACEMENT;  Surgeon: Sebastian Ache, MD;  Location: [facility] OR;  Service: Urology;  Laterality: Left;  . Hernia repair    . Mitral valve replacement      remote [facility] valve 8469 with redo tissue valve 06/2006  . Pacemaker placement  2006    Changed to CRT-D in 2009  . Av node ablation   2007  . Icd generator change  02/01/2014    Gen change to: Medtronic VIVA pulse generator, serial number [id number]  . Cardiac catheterization  06/2006    Mild ostial L main stenosis, CFX stent patent, RCA occluded (old)  . Coronary artery bypass graft  2008  . Pacemaker generator change N/A 02/01/2014    Procedure: PACEMAKER GENERATOR CHANGE;  Surgeon: Duke Salvia, MD;  Location: [facility] CATH LAB;  Service: Cardiovascular;  Laterality: N/A;  . Left heart catheterization with coronary angiogram N/A 08/23/2014    Procedure: LEFT HEART CATHETERIZATION WITH CORONARY ANGIOGRAM;  Surgeon: Corky Crafts, MD;  Location: [facility] CATH LAB;  Service: Cardiovascular;  Laterality: N/A;    Social History:  reports that he has been smoking Cigarettes.  He has been smoking about 2.00 packs per day. He has never used smokeless tobacco. He reports that he drinks about 3.6 oz of alcohol per week. He reports that he does not use illicit drugs.  Family History:  Family History  Problem Relation Age of Onset  . Stroke    . Heart disease    . Alzheimer's disease Mother   . Heart attack Father   . Heart attack Son   . Varicose Veins Son   . Deep vein thrombosis Son      Prior to Admission medications   Medication Sig Start Date End Date Taking? Authorizing Provider  atorvastatin (LIPITOR) 20 MG tablet TAKE 1 TABLET BY MOUTH EVERY DAY 05/31/14  Yes [name], MD  carvedilol (COREG) 6.25 MG tablet Take 6.25 mg by mouth 2 (two) times daily with a meal.  02/01/14  Yes Historical Provider, MD  ELIQUIS 5 MG TABS tablet TAKE 1 TABLET (5 MG TOTAL) BY MOUTH 2 (TWO) TIMES DAILY. 11/12/14  Yes [name], MD  fenofibrate (TRICOR) 48 MG tablet Take 1 tablet (48 mg total) by mouth daily. 10/22/14  Yes [name], MD  furosemide (LASIX) 40 MG tablet Take 1 tab by mouth in the morning and one half tablet (20 mg) in the pm   Yes Historical Provider, MD  isosorbide mononitrate (IMDUR) 30 MG 24 hr tablet Take  1 tablet (30 mg total) by mouth daily. 08/23/14  Yes Hollice Espy, MD  sacubitril-valsartan (ENTRESTO) 24-26 MG Take 1 tablet by mouth 2 (two) times daily. 10/12/14  Yes [name], MD  spironolactone (ALDACTONE) 25 MG tablet Take 1 tablet (25 mg total) by mouth daily. 05/10/14  Yes Bevelyn Buckles Bensimhon, MD  VENTOLIN HFA 108 (90 BASE) MCG/ACT inhaler Inhale 2 puffs into the lungs every 6 (six) hours as needed. Shortness of breath or wheezing 09/24/14  Yes Historical Provider, MD    Physical Exam: Filed Vitals:   12/24/14 0515 12/24/14 0530 12/24/14 0534 12/24/14 0620  BP: 151/75 148/65  156/66  Pulse: 58 59  61  Temp:   98 F (36.7 C)  97.5 F (36.4 C)  TempSrc:   Oral Oral  Resp: 23 24  18   Height:      Weight:    77.656 kg (171 lb 3.2 oz)  SpO2: 94% 96%  93%   General: Not in acute distress HEENT:       Eyes: PERRL, EOMI, no scleral icterus.       ENT: No discharge from the ears and nose, no pharynx injection, no tonsillar enlargement.        Neck: No JVD, no bruit, no mass felt. Heme: No neck lymph node enlargement. Cardiac: S1/S2, RRR, No murmurs, No gallops or rubs. Pulm:  No rales, wheezing, rhonchi or rubs. Abd: Soft, nondistended, nontender, no rebound pain, no organomegaly, BS present. Ext: 1+ pitting leg edema bilaterally. 2+DP/PT pulse bilaterally. Musculoskeletal: No joint deformities, No joint redness or warmth, no limitation of ROM in spin. Skin: No rashes.  Neuro: Alert, oriented X3, cranial nerves II-XII grossly intact, muscle strength 5/5 in all extremities.  Psych: Patient is not psychotic, no suicidal or hemocidal ideation.  Labs on Admission:  Basic Metabolic Panel:  Recent Labs Lab 12/24/14 0258  NA 140  K 3.9  CL 106  CO2 24  GLUCOSE 144*  BUN 13  CREATININE 1.09  CALCIUM 9.1   Liver Function Tests: No results for input(s): AST, ALT, ALKPHOS, BILITOT, PROT, ALBUMIN in the last 168 hours. No results for input(s): LIPASE, AMYLASE in the last  168 hours. No results for input(s): AMMONIA in the last 168 hours. CBC:  Recent Labs Lab 12/24/14 0258  WBC 9.8  NEUTROABS 6.2  HGB 12.5*  HCT 38.3*  MCV 89.3  PLT 153   Cardiac Enzymes:  Recent Labs Lab 12/24/14 0258  TROPONINI 0.07*    BNP (last 3 results)  Recent Labs  08/26/14 2119 10/12/14 1446  BNP 216.4* 74.5    ProBNP (last 3 results)  Recent Labs  02/06/14 2240 02/28/14 1417  PROBNP 3968.0* 575.8*    CBG: No results for input(s): GLUCAP in the last 168 hours.  Radiological Exams on Admission: Dg Chest 2 View  12/24/2014   CLINICAL DATA:  Acute onset of shortness of breath. Initial encounter.  EXAM: CHEST  2 VIEW  COMPARISON:  Chest radiograph performed 08/30/2014  FINDINGS: The lungs are well-aerated. Mild bibasilar opacities may reflect atelectasis or possibly mild pneumonia. Peribronchial thickening is noted. There is no evidence of pleural effusion or pneumothorax.  The heart is mildly enlarged. A pacemaker/AICD is noted at the left chest wall, with leads ending at the right atrium and right ventricle. The patient is status post median sternotomy. A valve replacement is noted. No acute osseous abnormalities are seen.  IMPRESSION: Mild bibasilar opacities may reflect atelectasis or possibly mild pneumonia. Mild cardiomegaly noted. Peribronchial thickening seen.   Electronically Signed   By: Roanna Raider M.D.   On: 12/24/2014 03:36    EKG: Independently reviewed.  Abnormal findings: QTC 481, AV paced, PVCs   Assessment/Plan Principal Problem:   Acute on chronic systolic congestive heart failure Active Problems:   Right atrial thrombus   Complete heart block   Hyperlipidemia   Hypertension   PVD (peripheral vascular disease)   COPD (chronic obstructive pulmonary disease)   Depression   Dyslipidemia   Persistent atrial fibrillation   Alcohol abuse   Dyspnea   Stroke   Tobacco abuse   CHF exacerbation   Elevated troponin  Acute on  chronic systolic congestive heart failure and acute on chronic  respiratory failure: obviously due to medication noncompliance. Cardiology was consulted, Dr. Tresa Endo saw patient and recommend slowly to his home medications.  -will admit to Telemetry bed. -appreciate Dr. Landry Dyke consultation -will treat with IV lasix 40 mg bid -will cycle CE X3 -will get 2-D echo to evaluate EF -strict In/Out -Daily body weight. -heart diet -Would restart imdur 30 mg daily and spironolactone 25 mg daily   -ABG to look for nonanion gap metabolic acidosis   -per, Dr. Tresa Endo, would efer restarting entresto and carvedilol while diuresing and achieving balance  CAD and Elevated troponin: Likely due to congestive heart failure exacerbation.currently no chest pain. -Troponin 3 -Follow-up 2-D echo -on lipitor and Eliqusi -when necessary nitroglycerin and morphine  Atrial Fibrillation: CHA2DS2-VASc Score is 6, needs oral anticoagulation. Patient is Eliquis at home. INR is not done on admission. Heart rate is well controlled. -continue Imdur  Hx of DVT -On Eliquis  Complete heart block: -has AICD  HLD: Last LDL was 44 on 10/12/14 -Continue home medications: Lipitor and fenofibrate  HTN: -Lasix, spironolactone -Hydralazine when necessary  COPD (chronic obstructive pulmonary disease): -nebulizers: DuoNeb, albuterol -Mucinex for cough  Tobacco abuse and Alcohol abuse: -Did counseling about importance of quitting smoking -Nicotine patch -Did counseling about the importance of quitting drinking -CIWA protocol   DVT ppx: On Eliquis Code Status: Full code Family Communication: None at bed side.   Disposition Plan: Admit to inpatient   Date of Service 12/24/2014    Lorretta Harp [name] Pager [phone]  If 7PM-7AM, please contact night-coverage [email or web address] Password [facility] 12/24/2014, 7:18 AM

## 2014-12-24 NOTE — Progress Notes (Signed)
UR Completed. Azai Gaffin, RN, BSN.  [phone] 

## 2014-12-24 NOTE — Progress Notes (Signed)
SATURATION QUALIFICATIONS: (This note is used to comply with regulatory documentation for home oxygen)  Patient Saturations on Room Air at Rest = 92%  Patient Saturations on Room Air while Ambulating = 86%  Patient Saturations on 2 Liters of oxygen while Ambulating = 92%  Please briefly explain why patient needs home oxygen:Pt quickly desaturated to <88% when ambulating 15 ft.   12/24/2014 Veda Canning, PT Pager: [phone]

## 2014-12-24 NOTE — ED Notes (Signed)
Dr Otter at bedside  

## 2014-12-24 NOTE — Consult Note (Signed)
Reason for Consult: elevated troponin, ran out of medications Primary Cardiologist: heart Failure, Dr. Aundra Dubin Referring Physician: Dr. Dionicia Abler [name] is an 69 y.o. male.  HPI: [name] is a 69 yo man with PMH of prior CVA, MV redo '08 with tissue valve, ongoing 2 ppd tobacco abuse, COPD, CAD with multiple prior stents, persistent atrial fibrillation with CHADS2VASC = 6 on apixaban previously, BIVICD for Mixed nonischemic/ischemic cardiomyopathy with chronic systolic heart failure last known EF 25-30%, PAD, dyslipidemia, hypertension and bipolar disorder who presents with chest pain and dyspnea that began at 1 am. He is challenging to talk to. However, finally he let me know that he has run out of his medications for the past ~ 3 weeks. He continues to smoke ~ 1 ppd. And, he drank 24 beers over the last 2 days but tells me he is not "drunk." Otherwise, he denies current chest pain. No sick contacts. 3    Past Medical History  Diagnosis Date  . DVT (deep venous thrombosis)   . Mitral valve disease     a. remote mitral replacement with Bjork Shiley valve;  b. 06/2006 Redo MVR with tissue valve.  Marland Kitchen COPD (chronic obstructive pulmonary disease)   . Depression   . Hypertension   . Dyslipidemia   . Ventral hernia   . CAD (coronary artery disease)     a. s/p prior PCI/stenting of the LCX;  b. 08/2014 MV: EF 20%, large septal, apical, and inferior infarct from apex to base, no ischemia;  c. 08/2014 NSTEMI/Cath: LM mod distal dzs extending into ostial LCX (80%), LAD tortuous, RI nl, OM mod dzs, RCA 100 CTO with R->R and L->R collats-->Med Rx.  . Mixed Ischemic and Nonischemic Cardiomyopathy     a. 8/.2015 Echo: Ef 25-30%;  b. 01/2014 s/p MDT [id number] Pershing Proud XT CRT-D (ser # [id number]).  . Chronic systolic congestive heart failure     a. 01/2014 Echo: EF 25-30%, mod conc LVH, mod dil LA.  . Bipolar disorder   . Anxiety   . Persistent atrial fibrillation     a. CHA2DS2VASc = 6->eliquis;  b. S/P AVN  RFCA an BiV ICD placement.  . Alcohol abuse   . Complete heart block     a. In setting of prior AV nodal ablation r/t afib-->BiV ICD (01/2014).  . Stroke   . CVA (cerebral vascular accident)     a. Multiple prior embolic strokes.  . Biventricular automatic implantable cardioverter defibrillator in situ     a. 01/2014 s/p MDT [id number] Viva XT CRT-D (ser # [id number]).    Past Surgical History  Procedure Laterality Date  . Cystoscopy w/ ureteral stent placement  07/12/2011    Procedure: CYSTOSCOPY WITH RETROGRADE PYELOGRAM/URETERAL STENT PLACEMENT;  Surgeon: Alexis Frock, MD;  Location: [facility];  Service: Urology;  Laterality: Left;  . Hernia repair    . Mitral valve replacement      remote [facility] valve [id number] with redo tissue valve 06/2006  . Pacemaker placement  2006    Changed to CRT-D in 2009  . Av node ablation  2007  . Icd generator change  02/01/2014    Gen change to: Medtronic VIVA pulse generator, serial number [id number]  . Cardiac catheterization  06/2006    Mild ostial L main stenosis, CFX stent patent, RCA occluded (old)  . Coronary artery bypass graft  2008  . Pacemaker generator change N/A 02/01/2014    Procedure: PACEMAKER GENERATOR CHANGE;  Surgeon: Deboraha Sprang,  MD;  Location: [facility] CATH [facility];  Service: Cardiovascular;  Laterality: N/A;  . Left heart catheterization with coronary angiogram N/A 08/23/2014    Procedure: LEFT HEART CATHETERIZATION WITH CORONARY ANGIOGRAM;  Surgeon: Jettie Booze, MD;  Location: [facility] CATH LAB;  Service: Cardiovascular;  Laterality: N/A;    Family History  Problem Relation Age of Onset  . Stroke    . Heart disease    . Alzheimer's disease Mother   . Heart attack Father   . Heart attack Son   . Varicose Veins Son   . Deep vein thrombosis Son     Social History:  reports that he has been smoking Cigarettes.  He has been smoking about 2.00 packs per day. He has never used smokeless tobacco. He reports that he drinks about 3.6 oz of  alcohol per week. He reports that he does not use illicit drugs.  Allergies:  Allergies  Allergen Reactions  . Warfarin Other (See Comments)    Non compliance and ETOH abuse    Medications: I have reviewed the patient's current medications. Prior to Admission:  (Not in a hospital admission) Scheduled: Continuous:  Results for orders placed or performed during the hospital encounter of 12/24/14 (from the past 48 hour(s))  Basic metabolic panel     Status: Abnormal   Collection Time: 12/24/14  2:58 AM  Result Value Ref Range   Sodium 140 135 - 145 mmol/L   Potassium 3.9 3.5 - 5.1 mmol/L   Chloride 106 101 - 111 mmol/L   CO2 24 22 - 32 mmol/L   Glucose, Bld 144 (H) 65 - 99 mg/dL   BUN 13 6 - 20 mg/dL   Creatinine, Ser 1.09 0.61 - 1.24 mg/dL   Calcium 9.1 8.9 - 10.3 mg/dL   GFR calc non Af Amer >60 >60 mL/min   GFR calc Af Amer >60 >60 mL/min    Comment: (NOTE) The eGFR has been calculated using the CKD EPI equation. This calculation has not been validated in all clinical situations. eGFR's persistently <60 mL/min signify possible Chronic Kidney Disease.    Anion gap 10 5 - 15  Ethanol     Status: Abnormal   Collection Time: 12/24/14  2:58 AM  Result Value Ref Range   Alcohol, Ethyl (B) 8 (H) <5 mg/dL    Comment:        LOWEST DETECTABLE LIMIT FOR SERUM ALCOHOL IS 5 mg/dL FOR MEDICAL PURPOSES ONLY   Troponin I     Status: Abnormal   Collection Time: 12/24/14  2:58 AM  Result Value Ref Range   Troponin I 0.07 (H) <0.031 ng/mL    Comment:        PERSISTENTLY INCREASED TROPONIN VALUES IN THE RANGE OF 0.04-0.49 ng/mL CAN BE SEEN IN:       -UNSTABLE ANGINA       -CONGESTIVE HEART FAILURE       -MYOCARDITIS       -CHEST TRAUMA       -ARRYHTHMIAS       -LATE PRESENTING MYOCARDIAL INFARCTION       -COPD   CLINICAL FOLLOW-UP RECOMMENDED.   CBC with Differential     Status: Abnormal   Collection Time: 12/24/14  2:58 AM  Result Value Ref Range   WBC 9.8 4.0 - 10.5  K/uL   RBC 4.29 4.22 - 5.81 MIL/uL   Hemoglobin 12.5 (L) 13.0 - 17.0 g/dL   HCT 38.3 (L) 39.0 - 52.0 %   MCV 89.3  78.0 - 100.0 fL   MCH 29.1 26.0 - 34.0 pg   MCHC 32.6 30.0 - 36.0 g/dL   RDW 14.2 11.5 - 15.5 %   Platelets 153 150 - 400 K/uL   Neutrophils Relative % 63 43 - 77 %   Neutro Abs 6.2 1.7 - 7.7 K/uL   Lymphocytes Relative 20 12 - 46 %   Lymphs Abs 2.0 0.7 - 4.0 K/uL   Monocytes Relative 14 (H) 3 - 12 %   Monocytes Absolute 1.3 (H) 0.1 - 1.0 K/uL   Eosinophils Relative 3 0 - 5 %   Eosinophils Absolute 0.3 0.0 - 0.7 K/uL   Basophils Relative 0 0 - 1 %   Basophils Absolute 0.0 0.0 - 0.1 K/uL  Urinalysis, Routine w reflex microscopic (not at [facility])     Status: None   Collection Time: 12/24/14  3:11 AM  Result Value Ref Range   Color, Urine YELLOW YELLOW   APPearance CLEAR CLEAR   Specific Gravity, Urine 1.010 1.005 - 1.030   pH 5.5 5.0 - 8.0   Glucose, UA NEGATIVE NEGATIVE mg/dL   Hgb urine dipstick NEGATIVE NEGATIVE   Bilirubin Urine NEGATIVE NEGATIVE   Ketones, ur NEGATIVE NEGATIVE mg/dL   Protein, ur NEGATIVE NEGATIVE mg/dL   Urobilinogen, UA 0.2 0.0 - 1.0 mg/dL   Nitrite NEGATIVE NEGATIVE   Leukocytes, UA NEGATIVE NEGATIVE    Comment: MICROSCOPIC NOT DONE ON URINES WITH NEGATIVE PROTEIN, BLOOD, LEUKOCYTES, NITRITE, OR GLUCOSE <1000 mg/dL.    Dg Chest 2 View  12/24/2014   CLINICAL DATA:  Acute onset of shortness of breath. Initial encounter.  EXAM: CHEST  2 VIEW  COMPARISON:  Chest radiograph performed 08/30/2014  FINDINGS: The lungs are well-aerated. Mild bibasilar opacities may reflect atelectasis or possibly mild pneumonia. Peribronchial thickening is noted. There is no evidence of pleural effusion or pneumothorax.  The heart is mildly enlarged. A pacemaker/AICD is noted at the left chest wall, with leads ending at the right atrium and right ventricle. The patient is status post median sternotomy. A valve replacement is noted. No acute osseous abnormalities are seen.   IMPRESSION: Mild bibasilar opacities may reflect atelectasis or possibly mild pneumonia. Mild cardiomegaly noted. Peribronchial thickening seen.   Electronically Signed   By: Garald Balding M.D.   On: 12/24/2014 03:36    Review of Systems  Constitutional: Negative for chills, weight loss and malaise/fatigue.  HENT: Negative for ear discharge and ear pain.   Eyes: Negative for photophobia and pain.  Respiratory: Positive for shortness of breath. Negative for cough and hemoptysis.   Cardiovascular: Positive for chest pain, orthopnea and leg swelling. Negative for palpitations.  Gastrointestinal: Negative for heartburn, nausea, abdominal pain and diarrhea.  Genitourinary: Negative for dysuria and hematuria.  Musculoskeletal: Negative for myalgias and neck pain.  Skin: Negative for rash.  Neurological: Negative for dizziness, tremors and headaches.  Endo/Heme/Allergies: Negative for polydipsia.  Psychiatric/Behavioral: Positive for substance abuse. Negative for depression and suicidal ideas.   Blood pressure 142/50, pulse 60, temperature 98.9 F (37.2 C), temperature source Oral, resp. rate 21, height 5' 7" (1.702 m), weight 78.217 kg (172 lb 7 oz), SpO2 94 %. Physical Exam  Nursing note and vitals reviewed. Constitutional: He is oriented to person, place, and time. No distress.  Appears disheveled  HENT:  Head: Normocephalic and atraumatic.  Nose: Nose normal.  Mouth/Throat: Oropharynx is clear and moist. No oropharyngeal exudate.  Eyes: Conjunctivae and EOM are normal. Pupils are equal, round, and  reactive to light. No scleral icterus.  Neck: Normal range of motion. Neck supple. JVD present.  JVD to mandible  Cardiovascular: Normal rate, regular rhythm, normal heart sounds and intact distal pulses.  Exam reveals no gallop.   No murmur heard. Respiratory: Effort normal. He has no wheezes. He has rales.  GI: Soft. Bowel sounds are normal. He exhibits no distension. There is no  tenderness.  Musculoskeletal: Normal range of motion. He exhibits edema. He exhibits no tenderness.  Trace edema bilaterally  Neurological: He is alert and oriented to person, place, and time. No cranial nerve deficit. Coordination normal.  Skin: Skin is dry. No rash noted. He is not diaphoretic. No erythema.  Lukewarm on exam  Psychiatric: He has a normal mood and affect. His behavior is normal. Thought content normal.  labs reviewed; trop 0.07 Chest x-ray: bilateral fullness EKG: AV paced, PVCs  Assessment/Plan: Mr. Vejar is a 69 yo man with PMH of prior CVA, MV redo '08 with tissue valve, ongoing 2 ppd tobacco abuse, COPD, CAD with multiple prior stents, persistent atrial fibrillation with CHADS2VASC = 6 on apixaban previously, BIVICD for Mixed nonischemic/ischemic cardiomyopathy with chronic systolic heart failure last known EF 25-30%, PAD, dyslipidemia, hypertension and bipolar disorder who presents with chest pain and dyspnea that began at 1 am. Differential for symptoms and mildly elevated troponin is broad but given concomitant CAD and heart failure likely multifactorial especially given he has been without medications for 2-3 weeks. His initial cardiac biomarkers appear most likely 2/2 heart failure but given significant CAD reasonable to trend.  Problem List Acute on chronic systolic heart failure Volume overload Chest Pain Known CAD Mildly elevated troponin COPD Alcohol abuse Ethyl alcohol level Plan 1. Telemetry, trend cardiac biomarkers 2. Gradually restart home medications - would start with active diuresis first given off HF medications for up to 3 weeks 3. Restart/Continue apixaban 5 mg daily for anticoagulation 4. Consider echocardiogram - last in August 2015 5. Would restart imdur 30 mg daily and spironolactone 25 mg daily  6. Give 40 mg IV lasix bid 7. Given ethyl alcohol level - would trend and also would obtain ABG to look for nonanion gap metabolic acidosis  8.  Would efer restarting entresto and carvedilol while diuresing and achieving balance 9. Appreciate medicine assistance with COPD which also may be contributing  ,  12/24/2014, 5:32 AM

## 2014-12-24 NOTE — Progress Notes (Signed)
Patient would like to talk to a Child psychotherapist or psych? Pt states he is staying in a hotel while his son is living in his house.

## 2014-12-24 NOTE — Evaluation (Signed)
Physical Therapy Evaluation and Discharge  Patient Details Name: [name] MRN: [id number] DOB: March 08, 1946 Today's Date: 12/24/2014   History of Present Illness  Adm 7/18 for SOB  PMHx-non-compliance with meds (x 2-3 weeks); hypertension, hyperlipidemia, depression, anxiety, DVT, atrial fibrillation on Eliquis, COPD on home oxygen, CAD, systolic congestive heart failure (EF 25-30%), bipolar disorder, alcohol abuse, tobacco abuse, stroke, complete heart block, s.p of AICD  Clinical Impression  Patient evaluated by Physical Therapy with no further acute PT needs identified. Patient with no balance deficits. Pt desaturated on room air to 86% after ambulating 30 ft; maintained >92% on 2L. Pt denies having oxygen at home and reports he has been living in a motel. PT is signing off. Thank you for this referral.     Follow Up Recommendations No PT follow up    Equipment Recommendations  None recommended by PT    Recommendations for Other Services       Precautions / Restrictions Precautions Precautions: Other (comment) (desaturates)      Mobility  Bed Mobility Overal bed mobility: Modified Independent                Transfers Overall transfer level: Independent Equipment used: None                Ambulation/Gait Ambulation/Gait assistance: Independent Ambulation Distance (Feet): 45 Feet Assistive device: None Gait Pattern/deviations: WFL(Within Functional Limits)   Gait velocity interpretation: at or above normal speed for age/gender    Stairs            Wheelchair Mobility    Modified Rankin (Stroke Patients Only)       Balance Overall balance assessment: No apparent balance deficits (not formally assessed)                                           Pertinent Vitals/Pain Pain Assessment: No/denies pain    Home Living Family/patient expects to be discharged to:: Other (Comment) (pt reports he stays in a motel)                  Additional Comments: denies having home O2    Prior Function Level of Independence: Independent               Hand Dominance        Extremity/Trunk Assessment   Upper Extremity Assessment: Overall WFL for tasks assessed           Lower Extremity Assessment: Overall WFL for tasks assessed      Cervical / Trunk Assessment: Normal  Communication   Communication: HOH  Cognition Arousal/Alertness: Awake/alert Behavior During Therapy: WFL for tasks assessed/performed Overall Cognitive Status: Within Functional Limits for tasks assessed                      General Comments      Exercises        Assessment/Plan    PT Assessment Patent does not need any further PT services  PT Diagnosis Difficulty walking   PT Problem List    PT Treatment Interventions     PT Goals (Current goals can be found in the Care Plan section) Acute Rehab PT Goals Patient Stated Goal: none PT Goal Formulation: All assessment and education complete, DC therapy    Frequency     Barriers to discharge  Co-evaluation               End of Session Equipment Utilized During Treatment: Oxygen Activity Tolerance: Patient tolerated treatment well Patient left: in chair;with call bell/phone within reach;with nursing/sitter in room;with chair alarm set Nurse Communication: Mobility status (good balance; desaturates)         Time: 0355-9741 PT Time Calculation (min) (ACUTE ONLY): 20 min   Charges:   PT Evaluation $Initial PT Evaluation Tier I: 1 Procedure     PT G Codes:        [name] Jan 12, 2015, 1:21 PM Pager [phone]

## 2014-12-24 NOTE — ED Provider Notes (Signed)
CSN: [id number]     Arrival date & time 12/24/14  0245 History  This chart was scribed for Dominic Severin, MD by [name], ED Scribe. This patient was seen in room A11C/A11C and the patient's care was started at 2:48 AM.    Chief Complaint  Patient presents with  . Chest Pain   The history is provided by the patient. No language interpreter was used.   HPI Comments: [name] is a 69 y.o. male with PMHx listed below brought in by ambulance, who presents to the Emergency Department complaining of CP onset at 1 AM. Per EMS pt woke of with CP and SOB. Pt has pacemaker and defibrillator placed. They state that he said he felt a jolt twice form his pacemaker throughout his entire body. Per Ems the SOB tends to worsen with exertion. On arrival Ems states that his spO2 was 88-89% and improved to around 98% with 2L of oxygen.   Pt later denies any complaint of CP. Pt is only complaining of SOB. He states that the shortness of breath woke him out of his sleep. Pt has a Hx of COPD and CHF. Pt states that he has not been complaint with is medications for the past 2-3 weeks due to running out. Pt states that he is still a current 1 pack per day smoker.   Past Medical History  Diagnosis Date  . DVT (deep venous thrombosis)   . Mitral valve disease     a. remote mitral replacement with Bjork Shiley valve;  b. 06/2006 Redo MVR with tissue valve.  Marland Kitchen COPD (chronic obstructive pulmonary disease)   . Depression   . Hypertension   . Dyslipidemia   . Ventral hernia   . CAD (coronary artery disease)     a. s/p prior PCI/stenting of the LCX;  b. 08/2014 MV: EF 20%, large septal, apical, and inferior infarct from apex to base, no ischemia;  c. 08/2014 NSTEMI/Cath: LM mod distal dzs extending into ostial LCX (80%), LAD tortuous, RI nl, OM mod dzs, RCA 100 CTO with R->R and L->R collats-->Med Rx.  . Mixed Ischemic and Nonischemic Cardiomyopathy     a. 8/.2015 Echo: Ef 25-30%;  b. 01/2014 s/p MDT [id number] Talbot Grumbling  XT CRT-D (ser # [id number]).  . Chronic systolic congestive heart failure     a. 01/2014 Echo: EF 25-30%, mod conc LVH, mod dil LA.  . Bipolar disorder   . Anxiety   . Persistent atrial fibrillation     a. CHA2DS2VASc = 6->eliquis;  b. S/P AVN RFCA an BiV ICD placement.  . Alcohol abuse   . Complete heart block     a. In setting of prior AV nodal ablation r/t afib-->BiV ICD (01/2014).  . Stroke   . CVA (cerebral vascular accident)     a. Multiple prior embolic strokes.  . Biventricular automatic implantable cardioverter defibrillator in situ     a. 01/2014 s/p MDT [id number] Viva XT CRT-D (ser # [id number]).   Past Surgical History  Procedure Laterality Date  . Cystoscopy w/ ureteral stent placement  07/12/2011    Procedure: CYSTOSCOPY WITH RETROGRADE PYELOGRAM/URETERAL STENT PLACEMENT;  Surgeon: Sebastian Ache, MD;  Location: [facility] OR;  Service: Urology;  Laterality: Left;  . Hernia repair    . Mitral valve replacement      remote [facility] valve 2595 with redo tissue valve 06/2006  . Pacemaker placement  2006    Changed to CRT-D in 2009  . Av node ablation  2007  . Icd generator change  02/01/2014    Gen change to: Medtronic VIVA pulse generator, serial number [id number]  . Cardiac catheterization  06/2006    Mild ostial L main stenosis, CFX stent patent, RCA occluded (old)  . Coronary artery bypass graft  2008  . Pacemaker generator change N/A 02/01/2014    Procedure: PACEMAKER GENERATOR CHANGE;  Surgeon: Duke Salvia, MD;  Location: [facility] CATH LAB;  Service: Cardiovascular;  Laterality: N/A;  . Left heart catheterization with coronary angiogram N/A 08/23/2014    Procedure: LEFT HEART CATHETERIZATION WITH CORONARY ANGIOGRAM;  Surgeon: Corky Crafts, MD;  Location: [facility] CATH LAB;  Service: Cardiovascular;  Laterality: N/A;   Family History  Problem Relation Age of Onset  . Stroke    . Heart disease    . Alzheimer's disease Mother   . Heart attack Father   . Heart attack Son   .  Varicose Veins Son   . Deep vein thrombosis Son    History  Substance Use Topics  . Smoking status: Current Every Day Smoker -- 2.00 packs/day    Types: Cigarettes    Last Attempt to Quit: 01/30/2003  . Smokeless tobacco: Never Used  . Alcohol Use: 3.6 oz/week    6 Standard drinks or equivalent per week     Comment: occasionally    Review of Systems  Respiratory: Positive for shortness of breath.   Cardiovascular: Positive for chest pain. Negative for leg swelling.  All other systems reviewed and are negative.    Allergies  Warfarin  Home Medications   Prior to Admission medications   Medication Sig Start Date End Date Taking? Authorizing Provider  apixaban (ELIQUIS) 5 MG TABS tablet Take 5 mg by mouth 2 (two) times daily.     Historical Provider, MD  atorvastatin (LIPITOR) 20 MG tablet TAKE 1 TABLET BY MOUTH EVERY DAY 05/31/14   [name], MD  carvedilol (COREG) 6.25 MG tablet Take 6.25 mg by mouth 2 (two) times daily with a meal.  02/01/14   Historical Provider, MD  ELIQUIS 5 MG TABS tablet TAKE 1 TABLET (5 MG TOTAL) BY MOUTH 2 (TWO) TIMES DAILY. 11/12/14   [name], MD  fenofibrate (TRICOR) 48 MG tablet Take 1 tablet (48 mg total) by mouth daily. 10/22/14   [name], MD  furosemide (LASIX) 40 MG tablet Take 1 tab by mouth in the morning and one half tablet (20 mg) in the pm    Historical Provider, MD  isosorbide mononitrate (IMDUR) 30 MG 24 hr tablet Take 1 tablet (30 mg total) by mouth daily. 08/23/14   Hollice Espy, MD  sacubitril-valsartan (ENTRESTO) 24-26 MG Take 1 tablet by mouth 2 (two) times daily. 10/12/14   [name], MD  spironolactone (ALDACTONE) 25 MG tablet Take 1 tablet (25 mg total) by mouth daily. 05/10/14   Bevelyn Buckles Bensimhon, MD  VENTOLIN HFA 108 (90 BASE) MCG/ACT inhaler Inhale 2 puffs into the lungs every 6 (six) hours as needed. Shortness of breath or wheezing 09/24/14   Historical Provider, MD   BP 106/87 mmHg  Pulse 70   Temp(Src) 98.9 F (37.2 C) (Oral)  Resp 19  Ht 5\' 7"  (1.702 m)  Wt 172 lb 7 oz (78.217 kg)  BMI 27.00 kg/m2  SpO2 95%   Physical Exam  Constitutional: He is oriented to person, place, and time. He appears well-developed and well-nourished. No distress.  Patient appears older than stated age, mildly confused  HENT:  Head: Normocephalic and atraumatic.  Nose: Nose normal.  Mouth/Throat: Oropharynx is clear and moist.  Eyes: Conjunctivae and EOM are normal. Pupils are equal, round, and reactive to light.  Neck: Normal range of motion. Neck supple. No JVD present. No tracheal deviation present. No thyromegaly present.  Cardiovascular: Normal rate, regular rhythm, normal heart sounds and intact distal pulses.  Exam reveals no gallop and no friction rub.   No murmur heard. Pulmonary/Chest: Effort normal. No stridor. No respiratory distress. He has wheezes. He has no rales. He exhibits no tenderness.  Patient has tachypnea, diminished in bases.  Abdominal: Soft. Bowel sounds are normal. He exhibits no distension and no mass. There is no tenderness. There is no rebound and no guarding.  Musculoskeletal: Normal range of motion. He exhibits no edema or tenderness.  Lymphadenopathy:    He has no cervical adenopathy.  Neurological: He is alert and oriented to person, place, and time. He displays normal reflexes. He exhibits normal muscle tone. Coordination normal.  Skin: Skin is warm and dry. No rash noted. No erythema. No pallor.  Psychiatric: He has a normal mood and affect. His behavior is normal. Judgment and thought content normal.  Nursing note and vitals reviewed.   ED Course  Procedures (including critical care time) DIAGNOSTIC STUDIES: Oxygen Saturation is 95% on 2L [place], adequate by my interpretation.    COORDINATION OF CARE: 3:07 AM-Discussed treatment plan with pt at bedside and pt agreed to plan.     Labs Review Labs Reviewed  BASIC METABOLIC PANEL - Abnormal; Notable for  the following:    Glucose, Bld 144 (*)    All other components within normal limits  ETHANOL - Abnormal; Notable for the following:    Alcohol, Ethyl (B) 8 (*)    All other components within normal limits  TROPONIN I - Abnormal; Notable for the following:    Troponin I 0.07 (*)    All other components within normal limits  CBC WITH DIFFERENTIAL/PLATELET - Abnormal; Notable for the following:    Hemoglobin 12.5 (*)    HCT 38.3 (*)    Monocytes Relative 14 (*)    Monocytes Absolute 1.3 (*)    All other components within normal limits  URINALYSIS, ROUTINE W REFLEX MICROSCOPIC (NOT AT [facility])  BRAIN NATRIURETIC PEPTIDE    Imaging Review Dg Chest 2 View  12/24/2014   CLINICAL DATA:  Acute onset of shortness of breath. Initial encounter.  EXAM: CHEST  2 VIEW  COMPARISON:  Chest radiograph performed 08/30/2014  FINDINGS: The lungs are well-aerated. Mild bibasilar opacities may reflect atelectasis or possibly mild pneumonia. Peribronchial thickening is noted. There is no evidence of pleural effusion or pneumothorax.  The heart is mildly enlarged. A pacemaker/AICD is noted at the left chest wall, with leads ending at the right atrium and right ventricle. The patient is status post median sternotomy. A valve replacement is noted. No acute osseous abnormalities are seen.  IMPRESSION: Mild bibasilar opacities may reflect atelectasis or possibly mild pneumonia. Mild cardiomegaly noted. Peribronchial thickening seen.   Electronically Signed   By: Roanna Raider M.D.   On: 12/24/2014 03:36     EKG Interpretation   Date/Time:  Monday December 24 2014 02:54:13 EDT Ventricular Rate:  81 PR Interval:  176 QRS Duration: 151 QT Interval:  414 QTC Calculation: 481 R Axis:   -122 Text Interpretation:  AV dual-paced rhythm Paired ventricular premature  complexes Nonspecific intraventricular conduction delay Anterolateral  infarct, age indeterminate Confirmed by  Aisley Whan  MD, Aodhan Scheidt ([id number]) on  12/24/2014 3:09:52  AM      MDM   Final diagnoses:  SOB (shortness of breath)  COPD exacerbation  Elevated troponin  Noncompliance with medication regimen  Tobacco abuse     I personally performed the services described in this documentation, which was scribed in my presence. The recorded information has been reviewed and is accurate.  69 year old male who presents after waking with shortness of breath this morning, he did also initially tell EMS that he was having chest pains.  Her weight now denies it.  He is a poor historian.  He has been off his medication for last 2-3 weeks.  Patient has history of DVT, mitral valve disease, COPD, cardiomyopathy, coronary disease.  He had a in STEMI in March, catheter that time showed diffuse disease not amenable to stenting, medical management was advised and possible CABG if not improving.  Patient denies chest pain at this time.  Troponin is slightly elevated.  His EKG shows a paced rhythm with intermittent PVCs.  We'll touch base with cardiology for their input.  5:16 AM Dr Tresa Endo contacted, no recommendations at this time.  D/w Dr Clyde Lundborg with hospitalist who will admit.  Dominic Severin, MD 12/24/14 [phone]

## 2014-12-24 NOTE — Progress Notes (Signed)
Patient seen and examined. Admitted earlier today for a CHF exacerbation. Has a history of noncompliance. Continue IV Lasix, monitor intake and output. We'll continue to follow.  Peggye Pitt, MD [name] Pager: [phone]

## 2014-12-24 NOTE — ED Notes (Signed)
Per EMS c/o feeling a jolt in his chest where his pacemaker is at.  Also c/o SOB.  O2 sat 88-89% when EMS arrived.  Placed on 2L O2.

## 2014-12-24 NOTE — Progress Notes (Addendum)
PT Cancellation Note  Patient Details Name: [name] MRN: [id number] DOB: May 21, 1946   Cancelled Treatment:    Reason Eval/Treat Not Completed: Patient not medically ready. Pt currently on bedrest until 6 pm.   Noted elevated troponins. Will await further cardiac enzymes prior to initiating PT.    Alailah Safley 12/24/2014, 8:04 AM Pager [phone]

## 2014-12-25 ENCOUNTER — Ambulatory Visit (HOSPITAL_COMMUNITY): Payer: Commercial Managed Care - HMO

## 2014-12-25 ENCOUNTER — Encounter: Payer: Self-pay | Admitting: *Deleted

## 2014-12-25 ENCOUNTER — Inpatient Hospital Stay (HOSPITAL_COMMUNITY): Payer: Commercial Managed Care - HMO

## 2014-12-25 DIAGNOSIS — I5021 Acute systolic (congestive) heart failure: Secondary | ICD-10-CM

## 2014-12-25 DIAGNOSIS — I5023 Acute on chronic systolic (congestive) heart failure: Principal | ICD-10-CM

## 2014-12-25 LAB — BASIC METABOLIC PANEL
ANION GAP: 9 (ref 5–15)
BUN: 23 mg/dL — ABNORMAL HIGH (ref 6–20)
CO2: 27 mmol/L (ref 22–32)
Calcium: 8.8 mg/dL — ABNORMAL LOW (ref 8.9–10.3)
Chloride: 99 mmol/L — ABNORMAL LOW (ref 101–111)
Creatinine, Ser: 1.33 mg/dL — ABNORMAL HIGH (ref 0.61–1.24)
GFR calc Af Amer: >60 mL/min (ref >60)
GFR calc non Af Amer: 54 mL/min — ABNORMAL LOW (ref >60)
GLUCOSE: 174 mg/dL — AB (ref 65–99)
Potassium: 4.4 mmol/L (ref 3.5–5.1)
Sodium: 135 mmol/L (ref 135–145)

## 2014-12-25 LAB — MAGNESIUM: Magnesium: 2 mg/dL (ref 1.7–2.4)

## 2014-12-25 IMAGING — CR DG CHEST 2V
2 series · 2 of 2 positions shown · non-contrast
Comparison: 02/07/2014

CLINICAL DATA: Cough, decreased lung sounds

EXAM:
CHEST  2 VIEW

[view not recorded (1 of 2)]
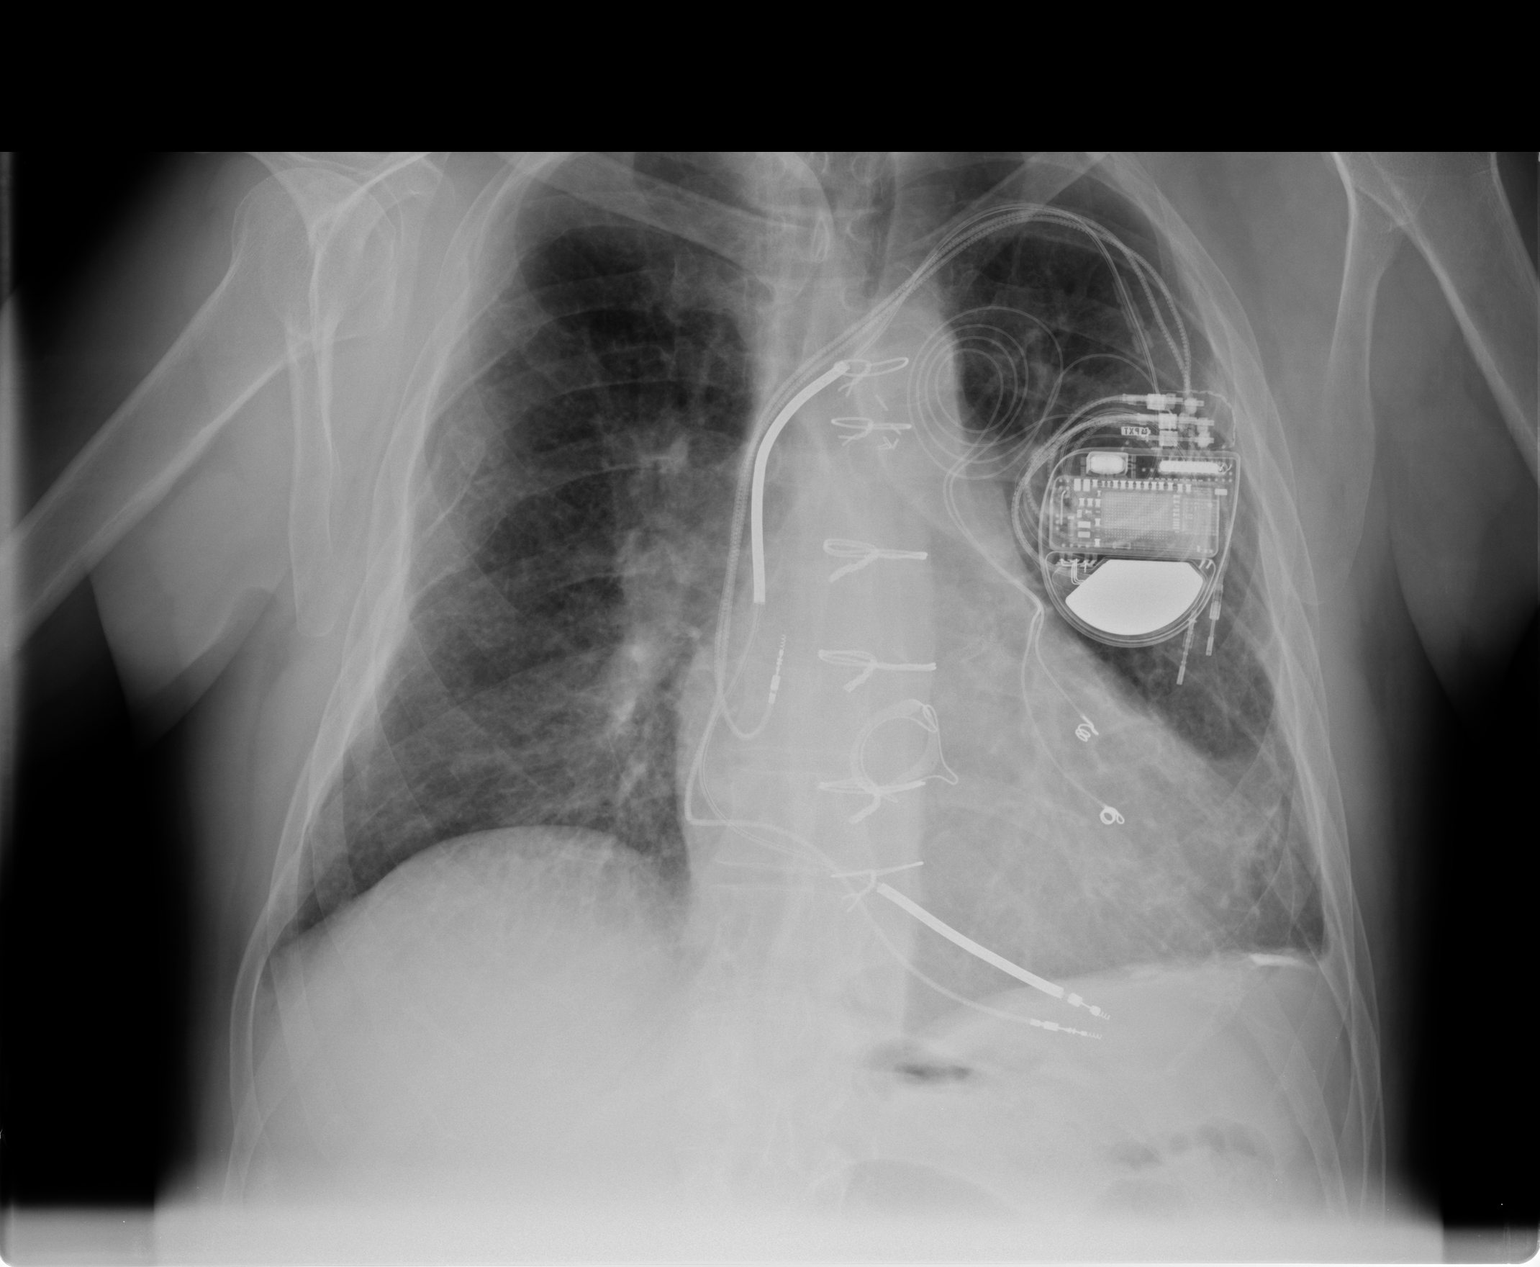

[view not recorded (2 of 2)]
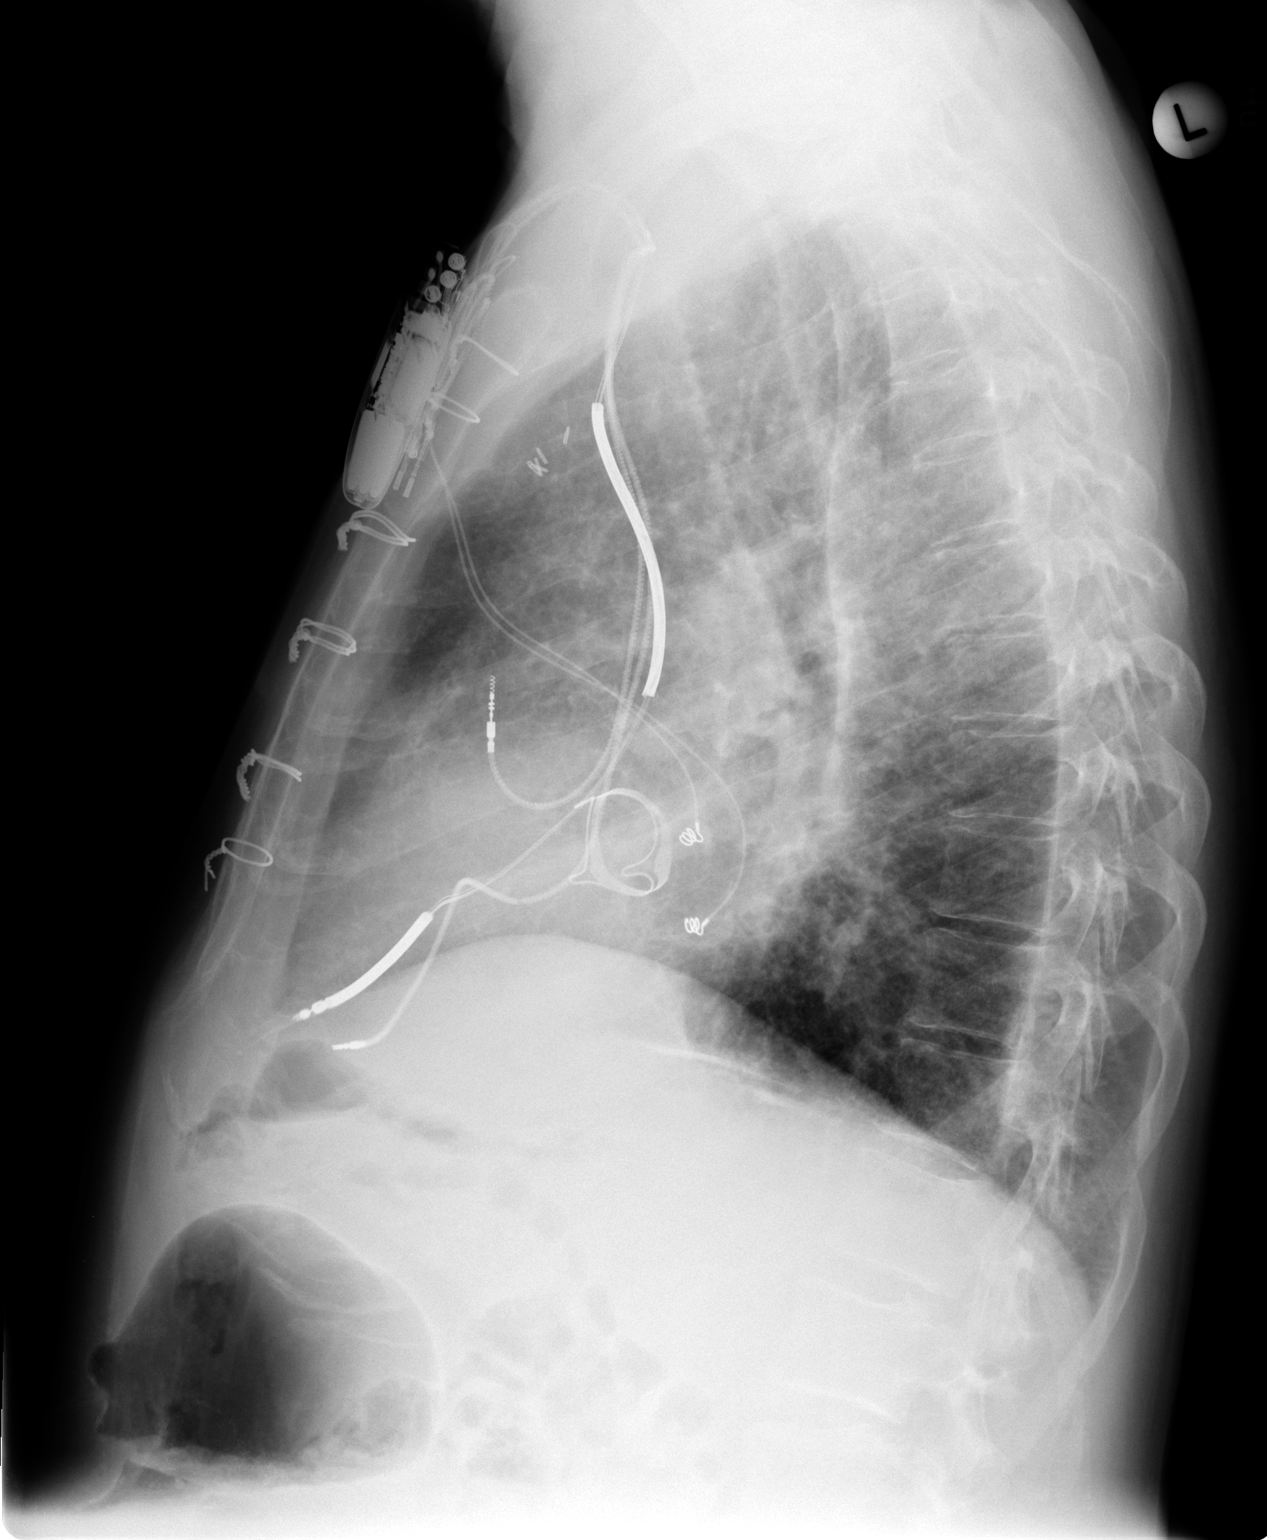

[2 of 2 positions shown; findings below may reference images not displayed]

FINDINGS: There is bilateral chronic interstitial thickening. There is a
calcified left basilar pleural plaque. There is no focal parenchymal
opacity, pleural effusion, or pneumothorax. There is stable
cardiomegaly. There is thoracic aortic atherosclerosis. There is
evidence of prior mitral valve replacement. There is evidence of
prior CABG.

There are old right posterolateral rib fractures.
IMPRESSION: No active cardiopulmonary disease.

## 2014-12-25 MED ORDER — SACUBITRIL-VALSARTAN 24-26 MG PO TABS
1.0000 | ORAL_TABLET | Freq: Two times a day (BID) | ORAL | Status: DC
  Administered 2014-12-25 – 2014-12-27 (×4): 1 via ORAL
  Filled 2014-12-25 (×5): qty 1

## 2014-12-25 MED ORDER — FUROSEMIDE 10 MG/ML IJ SOLN: 80.0000 mg | Freq: Two times a day (BID) | INTRAMUSCULAR | Status: DC

## 2014-12-25 MED ORDER — POTASSIUM CHLORIDE CRYS ER 20 MEQ PO TBCR
20.0000 meq | EXTENDED_RELEASE_TABLET | Freq: Every day | ORAL | Status: DC
  Administered 2014-12-25: 20 meq via ORAL
  Filled 2014-12-25 (×2): qty 1

## 2014-12-25 MED ORDER — FUROSEMIDE 10 MG/ML IJ SOLN
40.0000 mg | Freq: Once | INTRAMUSCULAR | Status: DC
  Filled 2014-12-25: qty 4

## 2014-12-25 MED ORDER — FUROSEMIDE 10 MG/ML IJ SOLN
80.0000 mg | Freq: Two times a day (BID) | INTRAMUSCULAR | Status: DC
  Administered 2014-12-25 – 2014-12-26 (×4): 80 mg via INTRAVENOUS
  Filled 2014-12-25 (×6): qty 8
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
## 2014-12-25 NOTE — Progress Notes (Signed)
  Occupational Therapy Evaluation Patient Details Name: [name] MRN: [id number] DOB: 1945/12/14 Today's Date: 12/25/2014    History of Present Illness Adm 7/18 for SOB  PMHx-non-compliance with meds (x 2-3 weeks); hypertension, hyperlipidemia, depression, anxiety, DVT, atrial fibrillation on Eliquis, COPD on home oxygen, CAD, systolic congestive heart failure (EF 25-30%), bipolar disorder, alcohol abuse, tobacco abuse, stroke, complete heart block, s.p of AICD   Clinical Impression   Patient admitted with above. Patient independent PTA. Patient currently functioning at an overall independent -> mod I level. D/C from acute OT services and no additional follow-up OT needs at this time. All appropriate education provided to patient. Please re-order OT if needed.      Follow Up Recommendations  No OT follow up;Supervision - Intermittent    Equipment Recommendations    None recommended by OT   Recommendations for Other Services  None at this time   Precautions / Restrictions Precautions Precautions: Other (comment) (watch 02 sats) Restrictions Weight Bearing Restrictions: No    Mobility Bed Mobility Overal bed mobility: Modified Independent  Transfers Overall transfer level: Independent Equipment used: None    Balance Overall balance assessment: No apparent balance deficits (not formally assessed)     ADL Overall ADL's : Modified independent;At baseline  General ADL Comments: Pt takes extra time due to [facility] this session, but overall back to baseline with ADLs and functional transfers. Pt transferred on/off standard toilet and performed simulated tub/shower transfer. Patient on room air entire session and 02 sats remained greater than 93%. Educated patient on energy conservation techniques; pursed lip breathing, seated rest breaks, crossing BLEs for LB ADLs, and no hot/steamy showers.     Vision Additional Comments: pt reports no change in vision          Pertinent  Vitals/Pain Pain Assessment: No/denies pain     Hand Dominance Right   Extremity/Trunk Assessment Upper Extremity Assessment Upper Extremity Assessment: Overall WFL for tasks assessed   Lower Extremity Assessment Lower Extremity Assessment: Overall WFL for tasks assessed   Cervical / Trunk Assessment Cervical / Trunk Assessment: Normal   Communication Communication Communication: HOH   Cognition Arousal/Alertness: Awake/alert Behavior During Therapy: WFL for tasks assessed/performed Overall Cognitive Status: Within Functional Limits for tasks assessed              Home Living Family/patient expects to be discharged to:: Other (Comment)   Additional Comments: denies having home O2      Prior Functioning/Environment Level of Independence: Independent      OT Diagnosis: Generalized weakness   OT Problem List:  n/a, no acute OT needs identified    OT Treatment/Interventions:   n/a, no acute OT needs identified    OT Goals(Current goals can be found in the care plan section) Acute Rehab OT Goals Patient Stated Goal: none  OT Frequency:   n/a, no acute OT needs identified    Barriers to D/C: Decreased caregiver support   End of Session Nurse Communication: Other (comment);Mobility status (02 sats on room air remained greater than 93%)  Activity Tolerance: Patient tolerated treatment well Patient left: in chair;with call bell/phone within reach   Time: 1334-1346 OT Time Calculation (min): 12 min Charges:  OT General Charges $OT Visit: 1 Procedure  Burgandy Hackworth , MS, OTR/L, CLT Pager: [phone]  12/25/2014, 1:54 PM

## 2014-12-25 NOTE — Progress Notes (Signed)
  Echocardiogram 2D Echocardiogram has been performed.  Jorje Guild 12/25/2014, 3:25 PM

## 2014-12-25 NOTE — Progress Notes (Signed)
Advanced Heart Failure Rounding Note  Primary Cardiologist: Shirlee Latch  Subjective:    He says he is breathing better today and denies any current CP.  He states it went away after a NTG tablet.  Says he feels like he is ready to go, but agrees to stay for more diuresis, as is still fluid overloaded on exam.  Out 0.9L, Large discrepancy in weight.  172 on admit and 171 subsequent weighing. 164 today.  Will make sure these are done standing.    Objective:   Weight Range: 164 lb 10.9 oz (74.7 kg) Body mass index is 25.79 kg/(m^2).   Vital Signs:   Temp:  [97.4 F (36.3 C)-98 F (36.7 C)] 97.4 F (36.3 C) (07/19 0548) Pulse Rate:  [60-63] 63 (07/19 0548) Resp:  [18-20] 20 (07/19 0548) BP: (126-166)/(57-68) 128/68 mmHg (07/19 0548) SpO2:  [91 %-98 %] 98 % (07/19 0735) Weight:  [164 lb 10.9 oz (74.7 kg)] 164 lb 10.9 oz (74.7 kg) (07/19 0548) Last BM Date: 12/24/14  Weight change: Filed Weights   12/24/14 0255 12/24/14 0620 12/25/14 0548  Weight: 172 lb 7 oz (78.217 kg) 171 lb 3.2 oz (77.656 kg) 164 lb 10.9 oz (74.7 kg)    Intake/Output:   Intake/Output Summary (Last 24 hours) at 12/25/14 0757 Last data filed at 12/25/14 0749  Gross per 24 hour  Intake    822 ml  Output   1500 ml  Net   -678 ml     Physical Exam: General: NAD. Neuro: Alert and oriented X 3. Moves all extremities spontaneously. Psych: Normal affect. HEENT:Poor dentition.a traumatic Neck: Supple without bruits. JVP to jaw Lungs: Resp regular and unlabored. Bibasilar crackles, diminished. Heart: RRR no s3, s4. 1/6 SEM ?heard best at apex Abdomen: Soft, non-tender, non-distended, BS + x 4. Diastasis recti hernia. Extremities: No clubbing, cyanosis. DP/Radials 2+ and equal bilaterally. 2+ edema RLE. Trace LLE.     Telemetry: V paced in 60s, occasional PVCs  Labs: CBC  Recent Labs  12/24/14 0258  WBC 9.8  NEUTROABS 6.2  HGB 12.5*  HCT 38.3*  MCV 89.3  PLT 153   Basic Metabolic  Panel  Recent Labs  16/10/96 0258 12/25/14 0400  NA 140 135  K 3.9 4.4  CL 106 99*  CO2 24 27  GLUCOSE 144* 174*  BUN 13 23*  CALCIUM 9.1 8.8*   Liver Function Tests No results for input(s): AST, ALT, ALKPHOS, BILITOT, PROT, ALBUMIN in the last 72 hours. No results for input(s): LIPASE, AMYLASE in the last 72 hours. Cardiac Enzymes  Recent Labs  12/24/14 0706 12/24/14 1158 12/24/14 1840  TROPONINI 0.07* 0.04* 0.04*    BNP: BNP (last 3 results)  Recent Labs  08/26/14 2119 10/12/14 1446 12/24/14 0258  BNP 216.4* 74.5 328.9*    ProBNP (last 3 results)  Recent Labs  02/06/14 2240 02/28/14 1417  PROBNP 3968.0* 575.8*     D-Dimer No results for input(s): DDIMER in the last 72 hours. Hemoglobin A1C No results for input(s): HGBA1C in the last 72 hours. Fasting Lipid Panel No results for input(s): CHOL, HDL, LDLCALC, TRIG, CHOLHDL, LDLDIRECT in the last 72 hours. Thyroid Function Tests No results for input(s): TSH, T4TOTAL, T3FREE, THYROIDAB in the last 72 hours.  Invalid input(s): FREET3  Other results:     Imaging/Studies:  Dg Chest 2 View  12/24/2014   CLINICAL DATA:  Acute onset of shortness of breath. Initial encounter.  EXAM: CHEST  2 VIEW  COMPARISON:  Chest radiograph  performed 08/30/2014  FINDINGS: The lungs are well-aerated. Mild bibasilar opacities may reflect atelectasis or possibly mild pneumonia. Peribronchial thickening is noted. There is no evidence of pleural effusion or pneumothorax.  The heart is mildly enlarged. A pacemaker/AICD is noted at the left chest wall, with leads ending at the right atrium and right ventricle. The patient is status post median sternotomy. A valve replacement is noted. No acute osseous abnormalities are seen.  IMPRESSION: Mild bibasilar opacities may reflect atelectasis or possibly mild pneumonia. Mild cardiomegaly noted. Peribronchial thickening seen.   Electronically Signed   By: Roanna Raider M.D.   On:  12/24/2014 03:36     Latest Echo  Latest Cath   Medications:     Scheduled Medications: . antiseptic oral rinse  7 mL Mouth Rinse BID  . apixaban  5 mg Oral BID  . atorvastatin  20 mg Oral q1800  . dextromethorphan-guaiFENesin  1 tablet Oral BID  . fenofibrate  54 mg Oral Daily  . folic acid  1 mg Oral Daily  . furosemide  40 mg Intravenous BID  . ipratropium-albuterol  3 mL Nebulization TID  . isosorbide mononitrate  30 mg Oral Daily  . LORazepam  0-4 mg Intravenous 4 times per day   Followed by  . [START ON 12/26/2014] LORazepam  0-4 mg Intravenous Q12H  . multivitamin with minerals  1 tablet Oral Daily  . nicotine  21 mg Transdermal Daily  . sodium chloride  3 mL Intravenous Q12H  . spironolactone  25 mg Oral Daily  . thiamine  100 mg Oral Daily   Or  . thiamine  100 mg Intravenous Daily     Infusions:     PRN Medications:  sodium chloride, acetaminophen, albuterol, hydrALAZINE, LORazepam **OR** LORazepam, morphine injection, nitroGLYCERIN, sodium chloride   Assessment/Plan   1. Acute on chronic systolic heart failure, ECHO 8/15 25-30% - Will increase lasix to 80 mg IV BID. Will give additional 40 now as he received 40 this morning. Will follow K. - 165 lbs today.  Was 150 in March.  Volume overloaded currently. 2. Chest Pain c mild troponin elevation - Flat, without upward trend.  Likely in setting of acute HF. 3. CAD 4. AKI on CKD stage 2 - No ACEi 5. COPD 6. Hx of CVA - Eliquis 7. Medication non compliance. 8. Alcohol abuse - Says he had 24 beers in the 2 days prior to coming into the hospital. On Thiamine.   Repeat ECHO ordered. Medical non compliance and alcohol abuse are likely what led to this admission. Added fluid restriction to his diet and will increase diuretics.  Will follow electrolytes and supp as needed. Off entresto and carvedilol currently. Will resume as able.   Length of Stay: 1   Graciella Freer PA-C 12/25/2014, 7:57  AM  Advanced Heart Failure Team Pager [phone] (M-F; 7a - 4p)  Please contact [facility] for night-coverage after hours (4p -7a ) and weekends on [email or web address]   Patient seen and examined with Otilio Saber, PA-C. We discussed all aspects of the encounter. I agree with the assessment and plan as stated above.   Improving but still volume overloaded. Agree with increasing IV lasix. Echo reviewed EF 30-35%. Resume Entresto 24/26  Bensimhon, Daniel,MD 6:28 PM

## 2014-12-25 NOTE — Progress Notes (Signed)
[name] PROGRESS NOTE  [name] ZOX:[id number] DOB: 28-Mar-1946 DOA: 12/24/2014 PCP: Cain Saupe, MD  Assessment/Plan: Acute Systolic CHF -EF 81-19% per ECHO in 01/2014. -Repeat ECHO pending this admission. -Not good diuresis overnight om lasix 40 mg IV BID. -Will increase lasix to 80 mg IV BID. -Remains volume overloaded on exam. -Non-complaint with diet and medications. -Per cardiology, holding off on ACE-I/entresto and BB as trying to maximize diuresis. Consider restarting soon.   Elevated Troponin -no upward trend, likely related to acute CHF.  A FIb -Continue Eliquis for anticoagulation. -Rate controlled at present.  Complete Heart Block -s/p PPM  HTN -Well controlled.  Hyperlipidemia -Continue statin and fibrate. (watch for rhabdo on this combination).  COPD -Continue inhalers.  Tobacco/Alcohol Abuse -Counseled on cessation. -Nicotine patch. -CIWA protocol.  Code Status: Full Code Family Communication: Patient only  Disposition Plan: To be determined   Consultants:  Cardiology   Antibiotics:  None   Subjective: No CP/SOB. "I feel like I am ready to Go".  Objective: Filed Vitals:   12/25/14 0548 12/25/14 0735 12/25/14 1328 12/25/14 1359  BP: 128/68  123/51   Pulse: 63  60   Temp: 97.4 F (36.3 C)  98.4 F (36.9 C)   TempSrc: Oral  Oral   Resp: 20  20   Height:      Weight: 74.7 kg (164 lb 10.9 oz)     SpO2: 97% 98% 91% 94%    Intake/Output Summary (Last 24 hours) at 12/25/14 1549 Last data filed at 12/25/14 1258  Gross per 24 hour  Intake    822 ml  Output   2375 ml  Net  -1553 ml   Filed Weights   12/24/14 0255 12/24/14 0620 12/25/14 0548  Weight: 78.217 kg (172 lb 7 oz) 77.656 kg (171 lb 3.2 oz) 74.7 kg (164 lb 10.9 oz)    Exam:   General:  AA Ox3  Cardiovascular: RRR  Respiratory: bibasilar crackles  Abdomen: S/NT/ND/+BS  Extremities: 1-2+ edema   Neurologic:  Non-focal  Data  Reviewed: Basic Metabolic Panel:  Recent Labs Lab 12/24/14 0258 12/25/14 0400  NA 140 135  K 3.9 4.4  CL 106 99*  CO2 24 27  GLUCOSE 144* 174*  BUN 13 23*  CREATININE 1.09 1.33*  CALCIUM 9.1 8.8*  MG  --  2.0   Liver Function Tests: No results for input(s): AST, ALT, ALKPHOS, BILITOT, PROT, ALBUMIN in the last 168 hours. No results for input(s): LIPASE, AMYLASE in the last 168 hours. No results for input(s): AMMONIA in the last 168 hours. CBC:  Recent Labs Lab 12/24/14 0258  WBC 9.8  NEUTROABS 6.2  HGB 12.5*  HCT 38.3*  MCV 89.3  PLT 153   Cardiac Enzymes:  Recent Labs Lab 12/24/14 0258 12/24/14 0706 12/24/14 1158 12/24/14 1840  TROPONINI 0.07* 0.07* 0.04* 0.04*   BNP (last 3 results)  Recent Labs  08/26/14 2119 10/12/14 1446 12/24/14 0258  BNP 216.4* 74.5 328.9*    ProBNP (last 3 results)  Recent Labs  02/06/14 2240 02/28/14 1417  PROBNP 3968.0* 575.8*    CBG: No results for input(s): GLUCAP in the last 168 hours.  No results found for this or any previous visit (from the past 240 hour(s)).   Studies: Dg Chest 2 View  12/24/2014   CLINICAL DATA:  Acute onset of shortness of breath. Initial encounter.  EXAM: CHEST  2 VIEW  COMPARISON:  Chest radiograph performed 08/30/2014  FINDINGS: The lungs are  well-aerated. Mild bibasilar opacities may reflect atelectasis or possibly mild pneumonia. Peribronchial thickening is noted. There is no evidence of pleural effusion or pneumothorax.  The heart is mildly enlarged. A pacemaker/AICD is noted at the left chest wall, with leads ending at the right atrium and right ventricle. The patient is status post median sternotomy. A valve replacement is noted. No acute osseous abnormalities are seen.  IMPRESSION: Mild bibasilar opacities may reflect atelectasis or possibly mild pneumonia. Mild cardiomegaly noted. Peribronchial thickening seen.   Electronically Signed   By: Roanna Raider M.D.   On: 12/24/2014 03:36     Scheduled Meds: . antiseptic oral rinse  7 mL Mouth Rinse BID  . apixaban  5 mg Oral BID  . atorvastatin  20 mg Oral q1800  . dextromethorphan-guaiFENesin  1 tablet Oral BID  . fenofibrate  54 mg Oral Daily  . folic acid  1 mg Oral Daily  . furosemide  80 mg Intravenous BID  . ipratropium-albuterol  3 mL Nebulization TID  . isosorbide mononitrate  30 mg Oral Daily  . LORazepam  0-4 mg Intravenous 4 times per day   Followed by  . [START ON 12/26/2014] LORazepam  0-4 mg Intravenous Q12H  . multivitamin with minerals  1 tablet Oral Daily  . nicotine  21 mg Transdermal Daily  . potassium chloride  20 mEq Oral Daily  . sodium chloride  3 mL Intravenous Q12H  . spironolactone  25 mg Oral Daily  . thiamine  100 mg Oral Daily   Or  . thiamine  100 mg Intravenous Daily   Continuous Infusions:   Principal Problem:   Acute on chronic systolic congestive heart failure Active Problems:   Right atrial thrombus   Complete heart block   Hyperlipidemia   Hypertension   PVD (peripheral vascular disease)   COPD (chronic obstructive pulmonary disease)   Depression   Dyslipidemia   Persistent atrial fibrillation   Alcohol abuse   Dyspnea   Stroke   Tobacco abuse   CHF exacerbation   Elevated troponin    Time spent: 25 minutes. Greater than 50% of this time was spent in direct contact with the patient coordinating care.    Chaya Jan  [name] Pager [phone]  If 7PM-7AM, please contact night-coverage at [email or web address], password [facility] 12/25/2014, 3:49 PM  LOS: 1 day

## 2014-12-26 DIAGNOSIS — Z720 Tobacco use: Secondary | ICD-10-CM

## 2014-12-26 DIAGNOSIS — F1010 Alcohol abuse, uncomplicated: Secondary | ICD-10-CM

## 2014-12-26 DIAGNOSIS — R7989 Other specified abnormal findings of blood chemistry: Secondary | ICD-10-CM

## 2014-12-26 LAB — BASIC METABOLIC PANEL
Anion gap: 10 (ref 5–15)
BUN: 42 mg/dL — ABNORMAL HIGH (ref 6–20)
CALCIUM: 9.2 mg/dL (ref 8.9–10.3)
CO2: 32 mmol/L (ref 22–32)
CREATININE: 1.37 mg/dL — AB (ref 0.61–1.24)
Chloride: 98 mmol/L — ABNORMAL LOW (ref 101–111)
GFR calc non Af Amer: 52 mL/min — ABNORMAL LOW (ref >60)
Glucose, Bld: 135 mg/dL — ABNORMAL HIGH (ref 65–99)
POTASSIUM: 3.9 mmol/L (ref 3.5–5.1)
SODIUM: 140 mmol/L (ref 135–145)

## 2014-12-26 MED ORDER — IPRATROPIUM-ALBUTEROL 0.5-2.5 (3) MG/3ML IN SOLN: 3.0000 mL | RESPIRATORY_TRACT | Status: DC | PRN

## 2014-12-26 MED ORDER — CARVEDILOL 6.25 MG PO TABS
6.2500 mg | ORAL_TABLET | Freq: Two times a day (BID) | ORAL | Status: DC
  Administered 2014-12-27: 6.25 mg via ORAL
  Filled 2014-12-26 (×3): qty 1

## 2014-12-26 MED ORDER — POTASSIUM CHLORIDE CRYS ER 20 MEQ PO TBCR
20.0000 meq | EXTENDED_RELEASE_TABLET | Freq: Two times a day (BID) | ORAL | Status: DC
  Administered 2014-12-26 (×2): 20 meq via ORAL
  Filled 2014-12-26 (×3): qty 1

## 2014-12-26 NOTE — Patient Outreach (Signed)
[facility] Customer service manager [facility]) Care Management  12/26/2014  [name] 01/30/1946 [id number]   Referral from [name], RN requesting Community RN and SW, assigned [name], RN and [employer], [place].  [name]. [name] [facility] Care Management [facility] CM Assistant Phone: [phone] Fax: [phone]

## 2014-12-26 NOTE — Progress Notes (Signed)
Advanced Heart Failure Rounding Note  Primary Cardiologist: Shirlee Latch  Subjective:    No complaints of SOB or further CP. Discussed importance of taking medications and monitoring fluid and salt intake. Asks when he can goes home, but agrees to stay for diuresis and gradual replacement of his chronic meds. Denies dizziness or lightheadedness.  Out 1.1 L, Down 2 lbs.  Objective:   Weight Range: 162 lb 12.8 oz (73.846 kg) Body mass index is 25.49 kg/(m^2).   Vital Signs:   Temp:  [97.9 F (36.6 C)-98.4 F (36.9 C)] 98.3 F (36.8 C) (07/20 0417) Pulse Rate:  [60-62] 62 (07/20 0417) Resp:  [16-20] 16 (07/20 0417) BP: (123-151)/(51-70) 133/65 mmHg (07/20 0417) SpO2:  [91 %-98 %] 94 % (07/20 0417) Weight:  [162 lb 12.8 oz (73.846 kg)] 162 lb 12.8 oz (73.846 kg) (07/20 0417) Last BM Date: 12/25/14  Weight change: Filed Weights   12/24/14 0620 12/25/14 0548 12/26/14 0417  Weight: 171 lb 3.2 oz (77.656 kg) 164 lb 10.9 oz (74.7 kg) 162 lb 12.8 oz (73.846 kg)    Intake/Output:   Intake/Output Summary (Last 24 hours) at 12/26/14 0705 Last data filed at 12/26/14 0316  Gross per 24 hour  Intake   1450 ml  Output   2625 ml  Net  -1175 ml     Physical Exam: General: NAD. Neuro: Alert and oriented X 3. Moves all extremities spontaneously. Psych: Normal affect. HEENT:Poor dentition.a traumatic Neck: Supple without bruits. JVP 10-12 Lungs: Resp regular and unlabored. Bibasilar crackles, diminished R>L. Heart: RRR no s3, s4. 1/6 SEM ?heard best at apex Abdomen: Soft, non-tender, non-distended, BS + x 4. Diastasis recti present. Extremities: No clubbing, cyanosis. DP/Radials 2+ and equal bilaterally. Trace LE edema bilaterally.   Telemetry: Reviewed, V paced in 60s, occasional PVCs  Labs: CBC  Recent Labs  12/24/14 0258  WBC 9.8  NEUTROABS 6.2  HGB 12.5*  HCT 38.3*  MCV 89.3  PLT 153   Basic Metabolic Panel  Recent Labs  12/25/14 0400 12/26/14 0336  NA  135 140  K 4.4 3.9  CL 99* 98*  CO2 27 32  GLUCOSE 174* 135*  BUN 23* 42*  CALCIUM 8.8* 9.2  MG 2.0  --    Liver Function Tests No results for input(s): AST, ALT, ALKPHOS, BILITOT, PROT, ALBUMIN in the last 72 hours. No results for input(s): LIPASE, AMYLASE in the last 72 hours. Cardiac Enzymes  Recent Labs  12/24/14 0706 12/24/14 1158 12/24/14 1840  TROPONINI 0.07* 0.04* 0.04*    BNP: BNP (last 3 results)  Recent Labs  08/26/14 2119 10/12/14 1446 12/24/14 0258  BNP 216.4* 74.5 328.9*    ProBNP (last 3 results)  Recent Labs  02/06/14 2240 02/28/14 1417  PROBNP 3968.0* 575.8*     D-Dimer No results for input(s): DDIMER in the last 72 hours. Hemoglobin A1C No results for input(s): HGBA1C in the last 72 hours. Fasting Lipid Panel No results for input(s): CHOL, HDL, LDLCALC, TRIG, CHOLHDL, LDLDIRECT in the last 72 hours. Thyroid Function Tests No results for input(s): TSH, T4TOTAL, T3FREE, THYROIDAB in the last 72 hours.  Invalid input(s): FREET3  Other results:     Imaging/Studies:  No results found.  Latest Echo  Latest Cath   Medications:     Scheduled Medications: . antiseptic oral rinse  7 mL Mouth Rinse BID  . apixaban  5 mg Oral BID  . atorvastatin  20 mg Oral q1800  . dextromethorphan-guaiFENesin  1 tablet Oral BID  .  fenofibrate  54 mg Oral Daily  . folic acid  1 mg Oral Daily  . furosemide  80 mg Intravenous BID  . ipratropium-albuterol  3 mL Nebulization TID  . isosorbide mononitrate  30 mg Oral Daily  . LORazepam  0-4 mg Intravenous Q12H  . multivitamin with minerals  1 tablet Oral Daily  . nicotine  21 mg Transdermal Daily  . potassium chloride  20 mEq Oral Daily  . sacubitril-valsartan  1 tablet Oral BID  . sodium chloride  3 mL Intravenous Q12H  . spironolactone  25 mg Oral Daily  . thiamine  100 mg Oral Daily   Or  . thiamine  100 mg Intravenous Daily    Infusions:    PRN Medications: sodium chloride,  acetaminophen, albuterol, hydrALAZINE, LORazepam **OR** LORazepam, morphine injection, nitroGLYCERIN, sodium chloride   Assessment/Plan   1. Acute on chronic systolic heart failure, ECHO 30-35% 12/25/14, Up from 8/15 25-30% - Continue lasix to 80 mg IV BID.  - 165 lbs today.  Was 150 in March.   - Volume status improved but still has extra on board. - K 3.9, will increase to 20 meq BID 2. Chest Pain c mild troponin elevation - Flat, without upward trend.  Likely in setting of acute HF. 3. 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 Cr 1.09 -> 1.33 -> 1.37 5. COPD 6. Hx of CVA - Eliquis 7. Medication non compliance. 8. Alcohol abuse - Says he had 24 beers in the 2 days prior to coming into the hospital. On Thiamine. CIWA protocol   Repeat ECHO shows slightly improved EF from last. Medical non compliance and alcohol abuse are primary barriers to patient treatment. Entresto resumed last night.  SBP in 130s. Will consult MD on optimal timing to resume coreg. HR in 60s but paced. Cr relatively stable.   Length of Stay: 2   Graciella Freer PA-C 12/26/2014, 7:05 AM  Advanced Heart Failure Team Pager [phone] (M-F; 7a - 4p)  Please contact [facility] for night-coverage after hours (4p -7a ) and weekends on [email or web address]   Patient seen and examined with Otilio Saber, PA-C. We discussed all aspects of the encounter. I agree with the assessment and plan as stated above.   Continues to improve. Will continue diuresis one more day. Restart carvedilol. Probably home in am. Watch renal function.   Bensimhon, Daniel,MD 10:57 PM
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
## 2014-12-26 NOTE — Discharge Instructions (Signed)

## 2014-12-26 NOTE — Care Management Important Message (Signed)
Important Message  Patient Details  Name: [name] MRN: [id number] Date of Birth: 10-10-1945   Medicare Important Message Given:  [facility] notification given    Kyla Balzarine 12/26/2014, 10:55 AMImportant Message  Patient Details  Name: [name] MRN: [id number] Date of Birth: 06-02-1946   Medicare Important Message Given:  Yes-second notification given    Kyla Balzarine 12/26/2014, 10:55 AM

## 2014-12-27 DIAGNOSIS — J438 Other emphysema: Secondary | ICD-10-CM

## 2014-12-27 LAB — BASIC METABOLIC PANEL
ANION GAP: 12 (ref 5–15)
BUN: 36 mg/dL — AB (ref 6–20)
CHLORIDE: 94 mmol/L — AB (ref 101–111)
CO2: 32 mmol/L (ref 22–32)
Calcium: 9.8 mg/dL (ref 8.9–10.3)
Creatinine, Ser: 1.27 mg/dL — ABNORMAL HIGH (ref 0.61–1.24)
GFR calc Af Amer: >60 mL/min (ref >60)
GFR, EST NON AFRICAN AMERICAN: 57 mL/min — AB (ref >60)
Glucose, Bld: 136 mg/dL — ABNORMAL HIGH (ref 65–99)
POTASSIUM: 5 mmol/L (ref 3.5–5.1)
Sodium: 138 mmol/L (ref 135–145)

## 2014-12-27 MED ORDER — SPIRONOLACTONE 25 MG PO TABS: 12.5000 mg | ORAL_TABLET | Freq: Every day | ORAL | Status: DC

## 2014-12-27 NOTE — Discharge Summary (Signed)
Physician Discharge Summary  [name] UEA:[id number] DOB: 08-Apr-1946 DOA: 12/24/2014  PCP: Cain Saupe, MD  Admit date: 12/24/2014 Discharge date: 12/27/2014  Recommendations for Outpatient Follow-up:  1. Pt will need to follow up with PCP in 1 weeks post discharge 2. Please obtain BMP within one week 3. Follow up with [name] on 01/22/15 at1100   Discharge Diagnoses:  Acute Systolic CHF -EF 47-82% per ECHO in 01/2014. -Repeat ECHO pending this admission--EF 30-35%, bioprosthetic mitral valve -Not good diuresis on lasix 40 mg IV BID. - increased lasix to 80 mg IV BID -Neg 10 pounds  -Dry weight appears to be around 150lbs -Non-complaint with diet and medications prior to admit -Restarted ACE-I/entresto 7/19  -appreciate cardiology followup -home with prior dose of lasix,  am,  pm - As the patient's potassium is creeping up, decrease spironolactone to 12.5 g daily -s/p CRT-D -restart coreg; continue imdur  Elevated Troponin -trend is flat, likely related to acute CHF. -No chest pain  A FIb -Case discussed with Dr. Sabino Snipes to resume apixiban -Rate controlled at present. -s/p AV node ablation  CAD:  LHC in 1/08 with patent LCx stent and old total occlusion of RCA. LHC (3/16) with moderate distal LM stenosis (IVUS 6.9 mm2), 80% ostial LCx, total occlusion RCA with collaterals. Ostial LCx not ideal for PCI, managed medically.   MV replacement:  -Bjork-Shiley in 1982, then redo with tissue mitral valve in 1/08.   HTN -Well controlled.  Hyperlipidemia -Continue statin and fibrate.   COPD -Continue inhalers.  Tobacco/Alcohol Abuse -Counseled on cessation. -Nicotine patch. -CIWA protocol.  Discharge Condition: stable  Disposition:  Follow-up Information    Follow up with Arvilla Meres, MD. Go on 01/22/2015.   Specialty:  Cardiology   Why:  at 11:00am in the Advanced Heart Failure Clinic--gate code 8000--please bring all  medications to appt   Contact information:   [address] [place] [address] [phone]       Diet:heart healthy Wt Readings from Last 3 Encounters:  12/27/14 69.99 kg (154 lb 4.8 oz)  10/12/14 73.369 kg (161 lb 12 oz)  10/05/14 73.211 kg (161 lb 6.4 oz)    History of present illness:  69 year old male with history of hypertension, hyperlipidemia, depression, systolic CHF, CAD, bioprosthetic mitral valve replacement, complete HB with AICD, and chronic atrial fibrillation presenting with progressive shortness of breath on the day of presentation. The patient also had some mild chest discomfort which has resolved. The patient endorsed noncompliance with his medications. Unfortunately, he continues to drink alcohol and smoke tobacco. The patient was started on intravenous furosemide. The patient had a good clinical response. The patient's serum creatinine continued to improve. The patient was transitioned to oral furosemide. He will follow up with the advanced heart failure clinic.  Consultants: Heart Failure Team  Discharge Exam: Filed Vitals:   12/27/14 0505  BP: 128/58  Pulse: 65  Temp: 97.9 F (36.6 C)  Resp: 18   Filed Vitals:   12/26/14 0417 12/26/14 1500 12/26/14 1959 12/27/14 0505  BP: 133/65 134/64 136/68 128/58  Pulse: 62 62 61 65  Temp: 98.3 F (36.8 C) 98.6 F (37 C) 97.8 F (36.6 C) 97.9 F (36.6 C)  TempSrc: Oral Oral Oral Oral  Resp: Height:      Weight: 73.846 kg (162 lb 12.8 oz)   69.99 kg (154 lb 4.8 oz)  SpO2: 94% 94% 93% 90%   General: A&O x 3, NAD,  pleasant, cooperative Cardiovascular: RRR, no rub, no gallop, no S3 Respiratory: Fine bibasilar crackles. No wheeze Abdomen:soft, nontender, nondistended, positive bowel sounds Extremities: No edema, No lymphangitis, no petechiae  Discharge Instructions      Discharge Instructions    AMB Referral to [facility] Care Management    Complete by:  As directed   Please  assign to [facility] RNCM for disease management and education for CHF.[facility] LCSW for transportation and social issues. Please see notes from hospital liaison on 12/25/14 as well as inpatient LCSW notes. Patient endorses he sometimes has issues with transportation to MD appointments. Going to [employer] post discharge- [employer]. Consents obtained. Please call with questions. Currenlty in hospital.Thanks. [name], MSN-Ed, [facility] [phone]  Reason for consult:  Please assign to [facility] RNCM and [facility] LCSW. Currently in hospital.  Diagnoses of:  Heart Failure  Expected date of contact:  1-3 days (reserved for hospital discharges)     Diet - low sodium heart healthy    Complete by:  As directed      Increase activity slowly    Complete by:  As directed             Medication List    TAKE these medications        atorvastatin 20 MG tablet  Commonly known as:  LIPITOR  TAKE 1 TABLET BY MOUTH EVERY DAY     carvedilol 6.25 MG tablet  Commonly known as:  COREG  Take 6.25 mg by mouth 2 (two) times daily with a meal.     ELIQUIS 5 MG Tabs tablet  Generic drug:  apixaban  TAKE 1 TABLET (5 MG TOTAL) BY MOUTH 2 (TWO) TIMES DAILY.     fenofibrate 48 MG tablet  Commonly known as:  TRICOR  Take 1 tablet (48 mg total) by mouth daily.     furosemide 40 MG tablet  Commonly known as:  LASIX  Take 1 tab by mouth in the morning and one half tablet (20 mg) in the pm     isosorbide mononitrate 30 MG 24 hr tablet  Commonly known as:  IMDUR  Take 1 tablet (30 mg total) by mouth daily.     sacubitril-valsartan 24-26 MG  Commonly known as:  ENTRESTO  Take 1 tablet by mouth 2 (two) times daily.     spironolactone 25 MG tablet  Commonly known as:  ALDACTONE  Take 0.5 tablets (12.5 mg total) by mouth daily.     VENTOLIN HFA 108 (90 BASE) MCG/ACT inhaler  Generic drug:  albuterol  Inhale 2 puffs into the lungs every 6 (six) hours as needed. Shortness of breath or wheezing           The results of significant diagnostics from this hospitalization (including imaging, microbiology, ancillary and laboratory) are listed below for reference.    Significant Diagnostic Studies: Dg Chest 2 View  12/24/2014   CLINICAL DATA:  Acute onset of shortness of breath. Initial encounter.  EXAM: CHEST  2 VIEW  COMPARISON:  Chest radiograph performed 08/30/2014  FINDINGS: The lungs are well-aerated. Mild bibasilar opacities may reflect atelectasis or possibly mild pneumonia. Peribronchial thickening is noted. There is no evidence of pleural effusion or pneumothorax.  The heart is mildly enlarged. A pacemaker/AICD is noted at the left chest wall, with leads ending at the right atrium and right ventricle. The patient is status post median sternotomy. A valve replacement is noted. No acute osseous abnormalities are seen.  IMPRESSION: Mild bibasilar  opacities may reflect atelectasis or possibly mild pneumonia. Mild cardiomegaly noted. Peribronchial thickening seen.   Electronically Signed   By: Roanna Raider M.D.   On: 12/24/2014 03:36     Microbiology: No results found for this or any previous visit (from the past 240 hour(s)).   Labs: Basic Metabolic Panel:  Recent Labs Lab 12/24/14 0258 12/25/14 0400 12/26/14 0336 12/27/14 0234  NA 140 135 140 138  K 3.9 4.4 3.9 5.0  CL 106 99* 98* 94*  CO2 24 27 32 32  GLUCOSE 144* 174* 135* 136*  BUN 13 23* 42* 36*  CREATININE 1.09 1.33* 1.37* 1.27*  CALCIUM 9.1 8.8* 9.2 9.8  MG  --  2.0  --   --    Liver Function Tests: No results for input(s): AST, ALT, ALKPHOS, BILITOT, PROT, ALBUMIN in the last 168 hours. No results for input(s): LIPASE, AMYLASE in the last 168 hours. No results for input(s): AMMONIA in the last 168 hours. CBC:  Recent Labs Lab 12/24/14 0258  WBC 9.8  NEUTROABS 6.2  HGB 12.5*  HCT 38.3*  MCV 89.3  PLT 153   Cardiac Enzymes:  Recent Labs Lab 12/24/14 0258 12/24/14 0706 12/24/14 1158 12/24/14 1840   TROPONINI 0.07* 0.07* 0.04* 0.04*   BNP: Invalid input(s): POCBNP CBG: No results for input(s): GLUCAP in the last 168 hours.  Time coordinating discharge:  Greater than 30 minutes  Signed:  Barby Colvard, DO [name] Pager: [phone] 12/27/2014, 2:31 PM
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
## 2014-12-27 NOTE — Progress Notes (Signed)
Advanced Heart Failure Rounding Note  Primary Cardiologist: Shirlee Latch  Subjective:    Denies SOB, CP, palpitations. Pt states he has been continuously on his Eliquis ( apart from recent non compliance) and no MD has recommended cessation.  Denies any recent history of falls or bleeding problems.    Out 0.9 L. Weight shows down 8 lbs since yesterday.  Admit weight 171, current weight 154 lb.  Objective:   Weight Range: 154 lb 4.8 oz (69.99 kg) Body mass index is 24.16 kg/(m^2).   Vital Signs:   Temp:  [97.8 F (36.6 C)-98.6 F (37 C)] 97.9 F (36.6 C) (07/21 0505) Pulse Rate:  [61-65] 65 (07/21 0505) Resp:  [18] 18 (07/21 0505) BP: (128-136)/(58-68) 128/58 mmHg (07/21 0505) SpO2:  [90 %-94 %] 90 % (07/21 0505) Weight:  [154 lb 4.8 oz (69.99 kg)] 154 lb 4.8 oz (69.99 kg) (07/21 0505) Last BM Date: 12/27/14  Weight change: Filed Weights   12/25/14 0548 12/26/14 0417 12/27/14 0505  Weight: 164 lb 10.9 oz (74.7 kg) 162 lb 12.8 oz (73.846 kg) 154 lb 4.8 oz (69.99 kg)    Intake/Output:   Intake/Output Summary (Last 24 hours) at 12/27/14 4098 Last data filed at 12/27/14 0507  Gross per 24 hour  Intake    720 ml  Output   1700 ml  Net   -980 ml     Physical Exam: General: NAD. Neuro: Alert and oriented X 3. Moves all extremities spontaneously. Psych: Normal affect. HEENT:Poor dentition.a traumatic Neck: Supple without bruits. JVP 10-12 Lungs: Resp regular and unlabored. Bibasilar crackles, diminished R>L. Heart: RRR no s3, s4. 1/6 SEM ?heard best at apex Abdomen: Soft, non-tender, non-distended, BS + x 4. Diastasis recti present. Extremities: No clubbing, cyanosis. DP/Radials 2+ and equal bilaterally. Trace LE edema bilaterally.   Telemetry: V paced in 60s, occasional PVCs  Labs: CBC No results for input(s): WBC, NEUTROABS, HGB, HCT, MCV, PLT in the last 72 hours. Basic Metabolic Panel  Recent Labs  12/25/14 0400 12/26/14 0336 12/27/14 0234  NA 135  140 138  K 4.4 3.9 5.0  CL 99* 98* 94*  CO2 27 32 32  GLUCOSE 174* 135* 136*  BUN 23* 42* 36*  CALCIUM 8.8* 9.2 9.8  MG 2.0  --   --    Liver Function Tests No results for input(s): AST, ALT, ALKPHOS, BILITOT, PROT, ALBUMIN in the last 72 hours. No results for input(s): LIPASE, AMYLASE in the last 72 hours. Cardiac Enzymes  Recent Labs  12/24/14 1158 12/24/14 1840  TROPONINI 0.04* 0.04*    BNP: BNP (last 3 results)  Recent Labs  08/26/14 2119 10/12/14 1446 12/24/14 0258  BNP 216.4* 74.5 328.9*    ProBNP (last 3 results)  Recent Labs  02/06/14 2240 02/28/14 1417  PROBNP 3968.0* 575.8*     D-Dimer No results for input(s): DDIMER in the last 72 hours. Hemoglobin A1C No results for input(s): HGBA1C in the last 72 hours. Fasting Lipid Panel No results for input(s): CHOL, HDL, LDLCALC, TRIG, CHOLHDL, LDLDIRECT in the last 72 hours. Thyroid Function Tests No results for input(s): TSH, T4TOTAL, T3FREE, THYROIDAB in the last 72 hours.  Invalid input(s): FREET3  Other results:     Imaging/Studies:  No results found.  Latest Echo  Latest Cath   Medications:     Scheduled Medications: . antiseptic oral rinse  7 mL Mouth Rinse BID  . apixaban  5 mg Oral BID  . atorvastatin  20 mg Oral q1800  .  carvedilol  6.25 mg Oral BID WC  . dextromethorphan-guaiFENesin  1 tablet Oral BID  . fenofibrate  54 mg Oral Daily  . folic acid  1 mg Oral Daily  . furosemide  80 mg Intravenous BID  . isosorbide mononitrate  30 mg Oral Daily  . LORazepam  0-4 mg Intravenous Q12H  . multivitamin with minerals  1 tablet Oral Daily  . nicotine  21 mg Transdermal Daily  . potassium chloride  20 mEq Oral BID  . sacubitril-valsartan  1 tablet Oral BID  . sodium chloride  3 mL Intravenous Q12H  . spironolactone  25 mg Oral Daily  . thiamine  100 mg Oral Daily   Or  . thiamine  100 mg Intravenous Daily    Infusions:    PRN Medications: sodium chloride, acetaminophen,  albuterol, hydrALAZINE, ipratropium-albuterol, morphine injection, nitroGLYCERIN, sodium chloride   Assessment/Plan   1. Acute on chronic systolic heart failure, ECHO 30-35% 12/25/14, Up from 8/15 25-30% - Continue lasix to 80 mg IV BID.  - 154 lbs today.  Was 150 in March.   - Volume status seems to be at baseline. - K 5.0 2. Chest Pain c mild troponin elevation - Flat, without upward trend.  Likely in setting of acute HF. 3. 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 Cr 1.09 -> 1.33 -> 1.37 -> 1.27 5. COPD 6. Hx of CVA - Eliquis 7. Medication non compliance. 8. Alcohol abuse - Says he had 24 beers in the 2 days prior to coming into the hospital. On Thiamine. CIWA protocol   ECHO this admission with slightly improved EF from last. Medical non compliance and alcohol abuse are primary barriers to patient treatment. Back home meds. Will transition to po lasix and consult MD for optimal home dosing/need for K supp.  SBP in 120-130s. Cr improved.  Dispo: Likely home today. With close follow up in HF clinic.  Length of Stay: 3   Graciella Freer PA-C 12/27/2014, 7:12 AM  Advanced Heart Failure Team Pager [phone] (M-F; 7a - 4p)  Please contact [facility] for night-coverage after hours (4p -7a ) and weekends on [email or web address]   Patient seen and examined with Otilio Saber, PA-C. We discussed all aspects of the encounter. I agree with the assessment and plan as stated above.   Volume status looks great. Can go home today on current medications. Hopefully he will be complaint. Resume Eliquis as well.  F/U in HF Clinic.  Tia Gelb,MD 2:12 PM

## 2014-12-28 ENCOUNTER — Other Ambulatory Visit: Payer: Self-pay | Admitting: *Deleted

## 2014-12-28 NOTE — Patient Outreach (Signed)
RNCM made 4 calls to available contact numbers and 3 numbers were disconnected and 1 number of pt's friend message left.   Plan: Will attempt to contact friend again on Monday  Kyliee Ortego RN, BSN  [facility] Care Management [phone])

## 2014-12-31 ENCOUNTER — Other Ambulatory Visit: Payer: Self-pay | Admitting: *Deleted

## 2014-12-31 ENCOUNTER — Encounter: Payer: Self-pay | Admitting: Licensed Clinical Social Worker

## 2014-12-31 ENCOUNTER — Other Ambulatory Visit: Payer: Self-pay | Admitting: Licensed Clinical Social Worker

## 2014-12-31 NOTE — Patient Outreach (Signed)
RNCM contacted [name] SW with [facility] to obtain any contact information for pt after reading her note stating she was able to reach pt's daughter. Message left will await a call back before attempting to contact pt.  Plan: Will await a return call from Flowella.  Costella Hatcher RN, BSN  [facility] Care Management [phone])

## 2014-12-31 NOTE — Patient Outreach (Signed)
  [facility]) Care Management  [facility] Social Work  12/31/2014  [name] 1945-08-27 [id number]    Current Medications:  Current Outpatient Prescriptions  Medication Sig Dispense Refill  . atorvastatin (LIPITOR) 20 MG tablet TAKE 1 TABLET BY MOUTH EVERY DAY 30 tablet 3  . carvedilol (COREG) 6.25 MG tablet Take 6.25 mg by mouth 2 (two) times daily with a meal.     . ELIQUIS 5 MG TABS tablet TAKE 1 TABLET (5 MG TOTAL) BY MOUTH 2 (TWO) TIMES DAILY. 60 tablet 3  . fenofibrate (TRICOR) 48 MG tablet Take 1 tablet (48 mg total) by mouth daily. 90 tablet 3  . furosemide (LASIX) 40 MG tablet Take 1 tab by mouth in the morning and one half tablet (20 mg) in the pm    . isosorbide mononitrate (IMDUR) 30 MG 24 hr tablet Take 1 tablet (30 mg total) by mouth daily. 30 tablet 5  . sacubitril-valsartan (ENTRESTO) 24-26 MG Take 1 tablet by mouth 2 (two) times daily. 60 tablet 0  . spironolactone (ALDACTONE) 25 MG tablet Take 0.5 tablets (12.5 mg total) by mouth daily. 30 tablet 0  . VENTOLIN HFA 108 (90 BASE) MCG/ACT inhaler Inhale 2 puffs into the lungs every 6 (six) hours as needed. Shortness of breath or wheezing     No current facility-administered medications for this visit.    Functional Status:  In your present state of health, do you have any difficulty performing the following activities: 12/24/2014 08/20/2014  Hearing? [name]  Vision? N N  Difficulty concentrating or making decisions? N Y  Walking or climbing stairs? N N  Dressing or bathing? N N  Doing errands, shopping? N N  Some encounter information is confidential and restricted. Go to Review Flowsheets activity to see all data.    Fall/Depression Screening:  No flowsheet data found.  Assessment: CSW received phone call from [facility] [name] (covering for [facility]). RNCM [name] stated that she has been unable to contact patient and has tried multiple numbers. [name] provided CSW with two new  numbers and CSW agreed to make outreach. CSW contacted [facility] where an automatic operator asked to type in room number. CSW waited for over 5 minutes to be connected to front desk with no success. CSW then called second contact number which went to straight to voicemail and CSW left HIPPA compliant voice message.   Plan: CSW will provide updates to [facility] [name].           CSW will plan to make another attempt by phone within the next few days.  [name], BSW, MSW, LCSW [name] Phone: [phone] Fax: [phone]

## 2014-12-31 NOTE — Patient Outreach (Signed)
[facility]) Care Management  12/31/2014  CREIGHTON WIX May 28, 1946 [id number]   CSW attempted to make initial outreach contact with patient via phone. CSW left HIPPA compliant voice message and will make second attempt to reach patient next week.  [name], BSW, MSW, LCSW [email or web address] Phone: [phone] Fax: [phone]
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
## 2014-12-31 NOTE — Patient Outreach (Signed)
[facility]) Care Management  12/31/2014  [name] [birth year] [id number]   Assessment- CSW received call back from patient's daughter. HIPPA verifications identified. Daughter states that number [phone] is hers. Patient's daughter seemed very upset and angry over thr phone and stated that herself, her brother and father have been having family problems. Daughter reports that she has "had it with trying to help him when he will not take our help." Daughter reports that patient "has no business being at a motel" and does not know how to reach him. Daughter reports that he will not take his medications and will not accept help from family at this time.  Plan-CSW will continue to make attempts to reach patient to discuss assistance with community resources.          CSW will update emergency contact and daughter when needed.     [name], BSW, MSW, LCSW [email or web address] Phone: [phone] Fax: [phone]

## 2015-01-01 ENCOUNTER — Other Ambulatory Visit: Payer: Self-pay | Admitting: Licensed Clinical Social Worker

## 2015-01-01 NOTE — Patient Outreach (Signed)
[facility]) Care Management  01/01/2015  RONDRE POARCH January 22, 1946 [id number]   CSW made second initial outreach attempt and successfully reached patient. HIPPA verifications received.  Patient reported that he does not want his daughter or son to know this number or to know where he is located. CSW informed patient that this would not take place. Patient's current caregiver and friend then got on the phone who patient gives consent to communicate to. CSW informed caregiver that CSW and RNCM [name] would like to schedule a home visit and would like caregiver to be there. [name] agreed to this and stated that next Tuesday in the early morning would work for the both of them. CSW informed RNCM [name] of above information.  Plan-To complete home assessment with RNCM Rose [name] on 01/08/15 at 8:30 am.  .[name], BSW, MSW, LCSW [email or web address] Phone: [phone] Fax: [phone]

## 2015-01-03 ENCOUNTER — Encounter: Payer: Self-pay | Admitting: *Deleted

## 2015-01-04 ENCOUNTER — Emergency Department (HOSPITAL_COMMUNITY): Payer: Commercial Managed Care - HMO

## 2015-01-04 ENCOUNTER — Encounter (HOSPITAL_COMMUNITY): Payer: Self-pay | Admitting: Emergency Medicine

## 2015-01-04 ENCOUNTER — Inpatient Hospital Stay (HOSPITAL_COMMUNITY)
Admission: EM | Admit: 2015-01-04 | Discharge: 2015-01-06 | DRG: 291 | Disposition: A | Discharge status: Home / Self Care | Payer: Commercial Managed Care - HMO | Attending: Cardiology | Admitting: Cardiology

## 2015-01-04 DIAGNOSIS — Z9114 Patient's other noncompliance with medication regimen: Secondary | ICD-10-CM | POA: Diagnosis present

## 2015-01-04 DIAGNOSIS — I272 Other secondary pulmonary hypertension: Secondary | ICD-10-CM | POA: Diagnosis present

## 2015-01-04 DIAGNOSIS — Z955 Presence of coronary angioplasty implant and graft: Secondary | ICD-10-CM | POA: Diagnosis not present

## 2015-01-04 DIAGNOSIS — F319 Bipolar disorder, unspecified: Secondary | ICD-10-CM | POA: Diagnosis present

## 2015-01-04 DIAGNOSIS — Z86718 Personal history of other venous thrombosis and embolism: Secondary | ICD-10-CM

## 2015-01-04 DIAGNOSIS — I2510 Atherosclerotic heart disease of native coronary artery without angina pectoris: Secondary | ICD-10-CM | POA: Diagnosis present

## 2015-01-04 DIAGNOSIS — I349 Nonrheumatic mitral valve disorder, unspecified: Secondary | ICD-10-CM | POA: Diagnosis present

## 2015-01-04 DIAGNOSIS — Z23 Encounter for immunization: Secondary | ICD-10-CM

## 2015-01-04 DIAGNOSIS — I959 Hypotension, unspecified: Secondary | ICD-10-CM | POA: Diagnosis present

## 2015-01-04 DIAGNOSIS — I442 Atrioventricular block, complete: Secondary | ICD-10-CM | POA: Diagnosis present

## 2015-01-04 DIAGNOSIS — R778 Other specified abnormalities of plasma proteins: Secondary | ICD-10-CM

## 2015-01-04 DIAGNOSIS — I25118 Atherosclerotic heart disease of native coronary artery with other forms of angina pectoris: Secondary | ICD-10-CM | POA: Diagnosis not present

## 2015-01-04 DIAGNOSIS — N170 Acute kidney failure with tubular necrosis: Secondary | ICD-10-CM | POA: Diagnosis not present

## 2015-01-04 DIAGNOSIS — Z8673 Personal history of transient ischemic attack (TIA), and cerebral infarction without residual deficits: Secondary | ICD-10-CM | POA: Diagnosis not present

## 2015-01-04 DIAGNOSIS — I5041 Acute combined systolic (congestive) and diastolic (congestive) heart failure: Secondary | ICD-10-CM | POA: Diagnosis not present

## 2015-01-04 DIAGNOSIS — R7989 Other specified abnormal findings of blood chemistry: Secondary | ICD-10-CM | POA: Diagnosis present

## 2015-01-04 DIAGNOSIS — Z0389 Encounter for observation for other suspected diseases and conditions ruled out: Secondary | ICD-10-CM

## 2015-01-04 DIAGNOSIS — Z8249 Family history of ischemic heart disease and other diseases of the circulatory system: Secondary | ICD-10-CM | POA: Diagnosis not present

## 2015-01-04 DIAGNOSIS — J449 Chronic obstructive pulmonary disease, unspecified: Secondary | ICD-10-CM | POA: Diagnosis present

## 2015-01-04 DIAGNOSIS — Z95810 Presence of automatic (implantable) cardiac defibrillator: Secondary | ICD-10-CM

## 2015-01-04 DIAGNOSIS — Z7901 Long term (current) use of anticoagulants: Secondary | ICD-10-CM

## 2015-01-04 DIAGNOSIS — I4819 Other persistent atrial fibrillation: Secondary | ICD-10-CM | POA: Diagnosis present

## 2015-01-04 DIAGNOSIS — Z4502 Encounter for adjustment and management of automatic implantable cardiac defibrillator: Secondary | ICD-10-CM

## 2015-01-04 DIAGNOSIS — K439 Ventral hernia without obstruction or gangrene: Secondary | ICD-10-CM | POA: Diagnosis present

## 2015-01-04 DIAGNOSIS — R079 Chest pain, unspecified: Secondary | ICD-10-CM | POA: Diagnosis not present

## 2015-01-04 DIAGNOSIS — Z590 Homelessness: Secondary | ICD-10-CM

## 2015-01-04 DIAGNOSIS — F1010 Alcohol abuse, uncomplicated: Secondary | ICD-10-CM | POA: Diagnosis present

## 2015-01-04 DIAGNOSIS — F17210 Nicotine dependence, cigarettes, uncomplicated: Secondary | ICD-10-CM | POA: Diagnosis present

## 2015-01-04 DIAGNOSIS — Z952 Presence of prosthetic heart valve: Secondary | ICD-10-CM

## 2015-01-04 DIAGNOSIS — Z951 Presence of aortocoronary bypass graft: Secondary | ICD-10-CM | POA: Diagnosis not present

## 2015-01-04 DIAGNOSIS — E785 Hyperlipidemia, unspecified: Secondary | ICD-10-CM | POA: Diagnosis present

## 2015-01-04 DIAGNOSIS — Z79899 Other long term (current) drug therapy: Secondary | ICD-10-CM

## 2015-01-04 DIAGNOSIS — I509 Heart failure, unspecified: Secondary | ICD-10-CM

## 2015-01-04 DIAGNOSIS — I255 Ischemic cardiomyopathy: Secondary | ICD-10-CM | POA: Diagnosis present

## 2015-01-04 DIAGNOSIS — F419 Anxiety disorder, unspecified: Secondary | ICD-10-CM | POA: Diagnosis present

## 2015-01-04 DIAGNOSIS — I482 Chronic atrial fibrillation: Secondary | ICD-10-CM | POA: Diagnosis present

## 2015-01-04 DIAGNOSIS — I5023 Acute on chronic systolic (congestive) heart failure: Secondary | ICD-10-CM | POA: Diagnosis present

## 2015-01-04 DIAGNOSIS — I739 Peripheral vascular disease, unspecified: Secondary | ICD-10-CM | POA: Diagnosis present

## 2015-01-04 DIAGNOSIS — I4891 Unspecified atrial fibrillation: Secondary | ICD-10-CM | POA: Diagnosis not present

## 2015-01-04 DIAGNOSIS — Z7982 Long term (current) use of aspirin: Secondary | ICD-10-CM

## 2015-01-04 LAB — CBC
HCT: 39.6 % (ref 39.0–52.0)
Hemoglobin: 13 g/dL (ref 13.0–17.0)
MCH: 29.1 pg (ref 26.0–34.0)
MCHC: 32.8 g/dL (ref 30.0–36.0)
MCV: 88.6 fL (ref 78.0–100.0)
Platelets: 191 10*3/uL (ref 150–400)
RBC: 4.47 MIL/uL (ref 4.22–5.81)
RDW: 14.7 % (ref 11.5–15.5)
WBC: 11.8 10*3/uL — AB (ref 4.0–10.5)

## 2015-01-04 LAB — BASIC METABOLIC PANEL
Anion gap: 9 (ref 5–15)
BUN: 16 mg/dL (ref 6–20)
CO2: 25 mmol/L (ref 22–32)
Calcium: 9.2 mg/dL (ref 8.9–10.3)
Chloride: 104 mmol/L (ref 101–111)
Creatinine, Ser: 1.08 mg/dL (ref 0.61–1.24)
Glucose, Bld: 112 mg/dL — ABNORMAL HIGH (ref 65–99)
Potassium: 4.5 mmol/L (ref 3.5–5.1)
SODIUM: 138 mmol/L (ref 135–145)

## 2015-01-04 LAB — TROPONIN I: TROPONIN I: 0.08 ng/mL — AB (ref <0.031)

## 2015-01-04 LAB — BRAIN NATRIURETIC PEPTIDE: B NATRIURETIC PEPTIDE 5: 585.9 pg/mL — AB (ref 0.0–100.0)

## 2015-01-04 MED ORDER — NITROGLYCERIN 0.4 MG SL SUBL
0.4000 mg | SUBLINGUAL_TABLET | SUBLINGUAL | Status: DC | PRN
  Administered 2015-01-04: 0.4 mg via SUBLINGUAL
  Filled 2015-01-04: qty 1

## 2015-01-04 NOTE — ED Provider Notes (Signed)
Patient is a 69 year old male, he has an extensive ischemic heart history. He presents to the hospital with a complaint of chest pain, he states that his defibrillator has fired twice today. His pain is a heaviness or pressure on the left side of his chest. There is no radiation. He has no shortness of breath nausea vomiting or diaphoresis. On exam the patient has a regular rate and rhythm, it appears paced on the EKG, his labs show a borderline elevation in troponin. His chest pain is waxing and waning and has come back several times in the ED. He has no peripheral edema, soft nontender abdomen.  Anticipate cardiology consult for admission   EKG Interpretation  Date/Time:  Friday January 04 2015 19:46:49 EDT Ventricular Rate:  61 PR Interval:  303 QRS Duration: 162 QT Interval:  492 QTC Calculation: 496 R Axis:   -109 Text Interpretation:  VENTRICULAR PACED RHYTHM No significant change was found Confirmed by [facility]  MD, TREY ([id number]) on 01/04/2015 7:53:59 PM      I saw and evaluated the patient, reviewed the resident's note and I agree with the findings and plan.    Final diagnoses:  Implantable cardioverter-defibrillator (ICD) discharge  Chest pain, rule out acute myocardial infarction  Elevated troponin  Congestive heart failure, unspecified congestive heart failure chronicity, unspecified congestive heart failure type      Eber Hong, MD 01/04/15 2329

## 2015-01-04 NOTE — ED Provider Notes (Signed)
History   Chief Complaint  Patient presents with  . Chest Pain    HPI  [name] is a 69 y.o. 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 fib, hypertension, bipolar who presents to ED for acute onset left-sided chest pain that began while at rest at home today. Patient reports this lasted for approximately one hour and was associated with diaphoresis, shortness of breath, nausea. Pain did not radiate. Patient reports pain was achy and moderate in intensity. He also reports he was shocked twice. Patient denies recent illness and says he was otherwise in his usual state of health prior to today. Denies any pain currently. Denies any leg pain or leg swelling. Patient is been taking all his medications including his liquids as instructed. No other complaints at this time.   Aggravating factors:none.  Alleviating factors: rest.  Hx of similar symptoms: no. Different from CP associated with prior MIs.    Past medical/surgical history, social history, medications, allergies and FH have been reviewed with patient and/or in documentation.  Past Medical History  Diagnosis Date  . DVT (deep venous thrombosis)   . Mitral valve disease     a. remote mitral replacement with Bjork Shiley valve;  b. 06/2006 Redo MVR with tissue valve.  Marland Kitchen COPD (chronic obstructive pulmonary disease)   . Depression   . Hypertension   . Dyslipidemia   . Ventral hernia   . CAD (coronary artery disease)     a. s/p prior PCI/stenting of the LCX;  b. 08/2014 MV: EF 20%, large septal, apical, and inferior infarct from apex to base, no ischemia;  c. 08/2014 NSTEMI/Cath: LM mod distal dzs extending into ostial LCX (80%), LAD tortuous, RI nl, OM mod dzs, RCA 100 CTO with R->R and L->R collats-->Med Rx.  . Mixed Ischemic and  Nonischemic Cardiomyopathy     a. 8/.2015 Echo: Ef 25-30%;  b. 01/2014 s/p MDT [id number] Talbot Grumbling XT CRT-D (ser # [id number]).  . Chronic systolic congestive heart failure     a. 01/2014 Echo: EF 25-30%, mod conc LVH, mod dil LA.  . Bipolar disorder   . Anxiety   . Persistent atrial fibrillation     a. CHA2DS2VASc = 6->eliquis;  b. S/P AVN RFCA an BiV ICD placement.  . Alcohol abuse   . Complete heart block     a. In setting of prior AV nodal ablation r/t afib-->BiV ICD (01/2014).  . Stroke   . CVA (cerebral vascular accident)     a. Multiple prior embolic strokes.  . Biventricular automatic implantable cardioverter defibrillator in situ     a. 01/2014 s/p MDT [id number] Viva XT CRT-D (ser # [id number]).   Past Surgical History  Procedure Laterality Date  . Cystoscopy w/ ureteral stent placement  07/12/2011    Procedure: CYSTOSCOPY WITH RETROGRADE PYELOGRAM/URETERAL STENT PLACEMENT;  Surgeon: Sebastian Ache, MD;  Location: [facility] OR;  Service: Urology;  Laterality: Left;  . Hernia repair    . Mitral valve replacement      remote [facility] valve 6433 with redo tissue valve 06/2006  . Pacemaker placement  2006    Changed to CRT-D in 2009  . Av node ablation  2007  . Icd generator change  02/01/2014    Gen change to: Medtronic VIVA pulse generator, serial number [id number]  .  Cardiac catheterization  06/2006    Mild ostial L main stenosis, CFX stent patent, RCA occluded (old)  . Coronary artery bypass graft  2008  . Pacemaker generator change N/A 02/01/2014    Procedure: PACEMAKER GENERATOR CHANGE;  Surgeon: Duke Salvia, MD;  Location: [facility] CATH LAB;  Service: Cardiovascular;  Laterality: N/A;  . Left heart catheterization with coronary angiogram N/A 08/23/2014    Procedure: LEFT HEART CATHETERIZATION WITH CORONARY ANGIOGRAM;  Surgeon: Corky Crafts, MD;  Location: [facility] CATH LAB;  Service: Cardiovascular;  Laterality: N/A;   Family History  Problem Relation Age of Onset  . Stroke    . Heart disease     . Alzheimer's disease Mother   . Heart attack Father   . Heart attack Son   . Varicose Veins Son   . Deep vein thrombosis Son    History  Substance Use Topics  . Smoking status: Current Every Day Smoker -- 2.00 packs/day    Types: Cigarettes    Last Attempt to Quit: 01/30/2003  . Smokeless tobacco: Never Used  . Alcohol Use: 18.0 oz/week    6 Standard drinks or equivalent, 24 Cans of beer per week     Comment: occasionally     Review of Systems Constitutional: Negative for fever, chills and fatigue. + diaphoresis HENT: Negative for congestion, rhinorrhea and sore throat.   Eyes: Negative for visual disturbance.  Respiratory: + SOB, Negative for cough and wheezing.   Cardiovascular: + for CP.  Gastrointestinal: + nausea, neg vomiting; denies abdominal pain and diarrhea.  Genitourinary: Negative for flank pain, dysuria, frequency.  Musculoskeletal: Negative for back pain, neck pain and neck stiffness, leg pain/swelling.  Skin: Negative for rash.  Neurological: Negative for dizziness and headaches.  All other systems reviewed and are negative.   Physical Exam  Physical Exam  ED Triage Vitals  Enc Vitals Group     BP --      Pulse Rate 01/04/15 1955 57     Resp 01/04/15 1955 24     Temp --      Temp src --      SpO2 01/04/15 1955 89 %     Weight --      Height --      Head Cir --      Peak Flow --      Pain Score 01/04/15 1955 0     Pain Loc --      Pain Edu? --      Excl. in GC? --    Constitutional: Patient is chronically ill-appearing in no acute distress Head: Normocephalic and atraumatic.  Eyes: Extraocular motion intact, no scleral icterus Neck: Supple without meningismus, mass, or overt JVD.  Respiratory: Effort normal and breath sounds normal. No respiratory distress. Chest wall: nonreproducible CP on exam CV: Heart regular rate and rhythm, no obvious murmurs.  Pulses +2 and symmetric. Euvolemic on exam. Abdomen: Soft, non-tender, non-distended MSK:  Extremities are atraumatic without deformity, ROM intact. No calf TTP. Skin: Warm, dry, intact. Neuro: Alert and oriented, no motor deficit noted Psychiatric: Mood and affect are normal.    ED Course  Procedures   Labs Reviewed  BASIC METABOLIC PANEL - Abnormal; Notable for the following:    Glucose, Bld 112 (*)    All other components within normal limits  CBC - Abnormal; Notable for the following:    WBC 11.8 (*)    All other components within normal limits  TROPONIN I - Abnormal; Notable  for the following:    Troponin I 0.08 (*)    All other components within normal limits  BRAIN NATRIURETIC PEPTIDE - Abnormal; Notable for the following:    B Natriuretic Peptide 585.9 (*)    All other components within normal limits   I personally reviewed and interpreted all labs.  DG Chest Port 1 View    (Results Pending)   I personally viewed above image(s) which were used in my medical decision making. Formal interpretations by Radiology.  MDM: [name] is a 69 y.o. male who p/w CP. ABCs intact. HDS, NAD. Cardiac w/u initiated. 324 mg ASA given by EMS.  EKG personally review by myself and showed: Rate of 61. Intervals / duration PR 0.303 sec., QRS 0.162 sec., and QTc 0.496 sec. Clinical impression: Paced rhythm, occasional PVCs, Nonspecific intraventricular conduction delay, unchanged from prior. Indication: CP    While in the ED patient has recurrent recurrent chest pain and repeat EKG was obtained which was unchanged from prior. Patient was given nitroglycerin and his pain resolved.  Tn 0.08. BNP 580. Workup otherwise unremarkable.  Device interrogated - no planned shocks delivered.  Patient will be admitted to cardiology for further management.  Clinical Impression: 1. Chest pain, rule out acute myocardial infarction   2. Implantable cardioverter-defibrillator (ICD) discharge   3. Elevated troponin   4. Congestive heart failure, unspecified congestive heart failure  chronicity, unspecified congestive heart failure type    Disposition: admit  Condition: Good  I have discussed the results, Dx and Tx plan with the pt(& family if present). He/she/they expressed understanding and agree(s) with the admission.   Pt seen in conjunction with Dr. Eber Hong, MD  Ames Dura, DO [facility] Emergency Medicine Resident - PGY-3            Ames Dura, MD 01/04/15 2951  Eber Hong, MD 01/04/15 2329

## 2015-01-04 NOTE — H&P (Signed)
CARDIOLOGY INPATIENT HISTORY AND PHYSICAL EXAMINATION NOTE  Patient ID: [name] MRN: [id number], DOB/AGE: [birth year]   Admit date: 01/04/2015   Primary Physician: Cain Saupe, MD Primary Cardiologist: Dr. Shirlee Latch and [name]  Reason for admission: chest pain and ICD discharge  HPI: This is a 69 y.o. 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 Patient was in his usual state of health today when he was watching TV when his defibrillator shocked him. Patient did not pass out, syncope, presyncope, palpitations prior to the episode. Post episode he started having chest pain. He does have runny nose going on for the last few weeks but no viral illness, fevers, chills, nausea, vomiting. He had substernal chest pain, non radiating, sharp sticking pain. He had some SOB, diaphoresis associated with the chest pain. The chest pain was similar to his MI pain. The pain relieved with NTG. Patient does not have decrease in urine. He is drinking beer once in a while.  He is pretty functional at baseline. He can walk 500 yards without getting SOB. He has son and a daughter. He does not weigh himself daily. His ICD was placed in 2008.  In the ED, his HR was 57, SpO2 was 89%, bp 128/60 RR 26. Pulse ox improved with oxygen to 96% on 2 L [place].  EKG showed baseline afib, V paced rhythm, wide QRS, T wave inversion in the V1 - V4.  Echo 12/25/2014 - moderately dilated LV, moderate to severely reduced global HK LVEF 30%, akinesis of inferolateral and apical myocardium, PA pressure 55 mmhg Cardiac cath  Problem List: Past Medical History  Diagnosis Date  . DVT (deep venous thrombosis)   .  Mitral valve disease     a. remote mitral replacement with Bjork Shiley valve;  b. 06/2006 Redo MVR with tissue valve.  Marland Kitchen COPD (chronic obstructive pulmonary disease)   . Depression   . Hypertension   . Dyslipidemia   . Ventral hernia   . CAD (coronary artery disease)     a. s/p prior PCI/stenting of the LCX;  b. 08/2014 MV: EF 20%, large septal, apical, and inferior infarct from apex to base, no ischemia;  c. 08/2014 NSTEMI/Cath: LM mod distal dzs extending into ostial LCX (80%), LAD tortuous, RI nl, OM mod dzs, RCA 100 CTO with R->R and L->R collats-->Med Rx.  . Mixed Ischemic and Nonischemic Cardiomyopathy     a. 8/.2015 Echo: Ef 25-30%;  b. 01/2014 s/p MDT [id number] Talbot Grumbling XT CRT-D (ser # [id number]).  . Chronic systolic congestive heart failure     a. 01/2014 Echo: EF 25-30%, mod conc LVH, mod dil LA.  . Bipolar disorder   . Anxiety   . Persistent atrial fibrillation     a. CHA2DS2VASc = 6->eliquis;  b. S/P AVN RFCA an BiV ICD placement.  . Alcohol abuse   . Complete heart block     a. In setting of prior AV nodal ablation r/t afib-->BiV ICD (01/2014).  . Stroke   . CVA (cerebral vascular accident)     a. Multiple prior embolic strokes.  . Biventricular automatic implantable cardioverter defibrillator in situ     a. 01/2014 s/p MDT [id number] Coletta Memos CRT-D (ser #  [id number]).    Past Surgical History  Procedure Laterality Date  . Cystoscopy w/ ureteral stent placement  07/12/2011    Procedure: CYSTOSCOPY WITH RETROGRADE PYELOGRAM/URETERAL STENT PLACEMENT;  Surgeon: Sebastian Ache, MD;  Location: [facility] OR;  Service: Urology;  Laterality: Left;  . Hernia repair    . Mitral valve replacement      remote [facility] valve 0454 with redo tissue valve 06/2006  . Pacemaker placement  2006    Changed to CRT-D in 2009  . Av node ablation  2007  . Icd generator change  02/01/2014    Gen change to: Medtronic VIVA pulse generator, serial number [id number]  . Cardiac catheterization  06/2006    Mild ostial L  main stenosis, CFX stent patent, RCA occluded (old)  . Coronary artery bypass graft  2008  . Pacemaker generator change N/A 02/01/2014    Procedure: PACEMAKER GENERATOR CHANGE;  Surgeon: Duke Salvia, MD;  Location: [facility] CATH LAB;  Service: Cardiovascular;  Laterality: N/A;  . Left heart catheterization with coronary angiogram N/A 08/23/2014    Procedure: LEFT HEART CATHETERIZATION WITH CORONARY ANGIOGRAM;  Surgeon: Corky Crafts, MD;  Location: [facility] CATH LAB;  Service: Cardiovascular;  Laterality: N/A;     Allergies:  Allergies  Allergen Reactions  . Warfarin Other (See Comments)    Non compliance and ETOH abuse     Home Medications Current Facility-Administered Medications  Medication Dose Route Frequency Provider Last Rate Last Dose  . nitroGLYCERIN (NITROSTAT) SL tablet 0.4 mg  0.4 mg Sublingual Q5 min PRN Ames Dura, MD   0.4 mg at 01/04/15 2103   Current Outpatient Prescriptions  Medication Sig Dispense Refill  . ELIQUIS 5 MG TABS tablet TAKE 1 TABLET (5 MG TOTAL) BY MOUTH 2 (TWO) TIMES DAILY. 60 tablet 3  . fenofibrate (TRICOR) 48 MG tablet Take 1 tablet (48 mg total) by mouth daily. 90 tablet 3  . furosemide (LASIX) 40 MG tablet Take 40 mg by mouth 2 (two) times daily.     . isosorbide mononitrate (IMDUR) 30 MG 24 hr tablet Take 1 tablet (30 mg total) by mouth daily. 30 tablet 5  . sacubitril-valsartan (ENTRESTO) 24-26 MG Take 1 tablet by mouth 2 (two) times daily. 60 tablet 0  . VENTOLIN HFA 108 (90 BASE) MCG/ACT inhaler Inhale 2 puffs into the lungs every 6 (six) hours as needed. Shortness of breath or wheezing    . aspirin 81 MG chewable tablet Chew 81 mg by mouth daily.    Marland Kitchen atorvastatin (LIPITOR) 20 MG tablet TAKE 1 TABLET BY MOUTH EVERY DAY 30 tablet 3  . carvedilol (COREG) 6.25 MG tablet Take 6.25 mg by mouth 2 (two) times daily with a meal.     . spironolactone (ALDACTONE) 25 MG tablet Take 0.5 tablets (12.5 mg total) by mouth daily. 30 tablet 0     Family  History  Problem Relation Age of Onset  . Stroke    . Heart disease    . Alzheimer's disease Mother   . Heart attack Father   . Heart attack Son   . Varicose Veins Son   . Deep vein thrombosis Son      History   Social History  . Marital Status: Divorced    Spouse Name: N/A  . Number of Children: N/A  . Years of Education: N/A   Occupational History  . Retired Therapist, music    Social History Main Topics  . Smoking status: Current Every Day Smoker -- 2.00  packs/day    Types: Cigarettes    Last Attempt to Quit: 01/30/2003  . Smokeless tobacco: Never Used  . Alcohol Use: 18.0 oz/week    6 Standard drinks or equivalent, 24 Cans of beer per week     Comment: occasionally  . Drug Use: No  . Sexual Activity: Not on file   Other Topics Concern  . Not on file   Social History Narrative   ** Merged History Encounter **         Review of Systems: General: no fevers, chills Cardiovascular: chest pain, dyspnea, leg edema, PND Dermatological: negative for rash Respiratory: negative for cough or wheezing Urologic: negative for hematuria Abdominal: negative for nausea, vomiting, diarrhea, bright red blood per rectum, melena, or hematemesis Neurologic: negative for visual changes, syncope, or dizziness  Physical Exam: Vitals: BP 121/59 mmHg  Pulse 87  Resp 19  SpO2 98% General: not in acute distress Neck: JVP 10 cm, neck supple Heart: regular rate and rhythm, S1, S2, no murmurs and he Lungs: CTAB  GI: ventral hernia Extremities: trace bilateral edema Neuro: AAO x 3  Psych: normal affect, no anxiety  Labs:   Results for orders placed or performed during the hospital encounter of 01/04/15 (from the past 24 hour(s))  Basic metabolic panel     Status: Abnormal   Collection Time: 01/04/15  7:53 PM  Result Value Ref Range   Sodium 138 135 - 145 mmol/L   Potassium 4.5 3.5 - 5.1 mmol/L   Chloride 104 101 - 111 mmol/L   CO2 25 22 - 32 mmol/L   Glucose, Bld 112 (H) 65 - 99  mg/dL   BUN 16 6 - 20 mg/dL   Creatinine, Ser 1.61 0.61 - 1.24 mg/dL   Calcium 9.2 8.9 - 09.6 mg/dL   GFR calc non Af Amer >60 >60 mL/min   GFR calc Af Amer >60 >60 mL/min   Anion gap 9 5 - 15  CBC     Status: Abnormal   Collection Time: 01/04/15  7:53 PM  Result Value Ref Range   WBC 11.8 (H) 4.0 - 10.5 K/uL   RBC 4.47 4.22 - 5.81 MIL/uL   Hemoglobin 13.0 13.0 - 17.0 g/dL   HCT 04.5 40.9 - 81.1 %   MCV 88.6 78.0 - 100.0 fL   MCH 29.1 26.0 - 34.0 pg   MCHC 32.8 30.0 - 36.0 g/dL   RDW 91.4 78.2 - 95.6 %   Platelets 191 150 - 400 K/uL  Troponin I     Status: Abnormal   Collection Time: 01/04/15  7:53 PM  Result Value Ref Range   Troponin I 0.08 (H) <0.031 ng/mL  Brain natriuretic peptide     Status: Abnormal   Collection Time: 01/04/15  7:53 PM  Result Value Ref Range   B Natriuretic Peptide 585.9 (H) 0.0 - 100.0 pg/mL     Radiology/Studies: Dg Chest 2 View  12/24/2014   CLINICAL DATA:  Acute onset of shortness of breath. Initial encounter.  EXAM: CHEST  2 VIEW  COMPARISON:  Chest radiograph performed 08/30/2014  FINDINGS: The lungs are well-aerated. Mild bibasilar opacities may reflect atelectasis or possibly mild pneumonia. Peribronchial thickening is noted. There is no evidence of pleural effusion or pneumothorax.  The heart is mildly enlarged. A pacemaker/AICD is noted at the left chest wall, with leads ending at the right atrium and right ventricle. The patient is status post median sternotomy. A valve replacement is noted. No acute osseous abnormalities  are seen.  IMPRESSION: Mild bibasilar opacities may reflect atelectasis or possibly mild pneumonia. Mild cardiomegaly noted. Peribronchial thickening seen.   Electronically Signed   By: Roanna Raider M.D.   On: 12/24/2014 03:36   Dg Chest Port 1 View  01/04/2015   CLINICAL DATA:  Chest pain and shortness of breath tonight.  EXAM: PORTABLE CHEST - 1 VIEW  COMPARISON:  12/24/2014  FINDINGS: Sternotomy wires and left-sided pacemaker  are unchanged. Prostatic are valve unchanged. Lungs are adequately inflated and demonstrate mild prominence of the perihilar markings with mild hazy left base opacification. Mild stable cardiomegaly. Remainder of the exam is unchanged.  IMPRESSION: Mild cardiomegaly with findings suggesting minimal interstitial edema. Cannot exclude an effusion/atelectasis versus infection in the left base.   Electronically Signed   By: Elberta Fortis M.D.   On: 01/04/2015 20:23    EKG: baseline afib, V paced rhythm, wide QRS, T wave inversion in the V1 - V4.   Cardiac cath: 08/2014 MV: EF 20%, large septal, apical, and inferior infarct from apex to base, no ischemia; c. 08/2014 NSTEMI/Cath: LM mod distal dzs extending into ostial LCX (80%), LAD tortuous, RI nl, OM mod dzs, RCA 100 CTO with R->R and L->R collats-->Med Rx   Medical decision making:  Discussed care with the patient Discussed care with the physician on the phone Reviewed labs and imaging personally Reviewed prior records  ASSESSMENT AND PLAN:  This is a 70 y.o. 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 He is however in heart failure and will be admitted for >/= 3 days for volume optimization.  ICD discharge x 2 per report by the patient, moderate to severe, acute in nature \/2015 s/p MDT [id number] Viva XT CRT-D change to CRT D in 2009 - electrolytes wnl - ordered device interrogation to evaluate for possible etiology of shock - reviewed ICD interrogation, no ICD discharge or ATP per interrogation - probably felt forceful palpitation  Acute on chronic systolic heart  failure, moderate severity, acute in nature NYHA class III, AHA stage C w/ pulmonary hypertension, etiology is both ischemic and non ischemic secondary to alcohol abuse CXR showed pulmonary edema, elevated BNP. However optivol evaluation did not demonstrate significant changes in impedence on interrogation of MDT device - will diurese with IV lasix  - BMP BID - strict I/os, daily weights, 2 g low salt diet, restrict fluids to 1500 ml/day - continue entresto - consider restarting spirolactone after discussing with Dr. Teressa Lower once diuresis is achieved and patient is euvolemic as patient is again retaining fluids despite verbal assurance of fluid restriction and continues to be NYHA class II-III   Chest pain, atypical in nature, related to discharge which has resolved Likely etiology is musculoskeletal, will r/o ACS, severe in intensity and is acute in nature - cycle troponin, chronically low level elevated troponin, 0.08 - NTG as needed - continue aspirin, elqiuis  Elevated troponin, in the setting of normal EKG Indeterminate range, similar to last admission Cycle troponin If symptoms continue and troponin rise, will start on IV heparin and stop eliquis for considering cardiac cath  Until then, will treat medically Patient is  not NPO post midnight  Chronic coronary artery disease, severe in nature and chronic in temporality Last cath in 08/2014 rior PCI/stenting of the LCX; cath on 08/2014 showed EF 20%, large septal, apical, and inferior infarct from apex to base, no ischemia; c. 08/2014 NSTEMI/Cath: LM mod distal dzs extending into ostial LCX (80%), LAD tortuous, RI nl, OM mod dzs, RCA 100 CTO with R->R and L->R collats-->Med Rx was recommended - on medical therapy with coreg, lipitor 20 mg, aspirin 81 mg, imdur 30 mg   Tobacco abuse, ongoing  Counseled, spent 5 min Does want to quit  Chronic atrial fibrillation CHADS2VASc = 6 HAS BLED score 3 His blood pressure was initially patient  has chronic atrial fibrillation which was managed in the past with Av node ablation and pacemaker Patient is currently on eliquis Continue anti-coagulation  Alcohol abuse drinks 8-20 ounces/week Counseled for alcohol abuse, he is working on it  Chronic conditions: Hyperlipidemia - continue statin and fenofibrate  COPD/emphysema - continue inhaler prn  Non compliance with medications - counseled AV nodal ablation/complete heart block - supportive care Poor social support - support for medical therapy, outpatient follow up needs to be arranged prior to discharge Ventral epigastric hernia - not a candidate for elective surgery due to high risk, not in active pain, no obstruction symptoms, monitor History of CVA - continue aspirin and statin  BiV Medtronic ICD/CRTD Bipolar discorder - not actively having symptoms, not on medications Anxiety disorder NOS - not on medications  Depression NOS - not homicidal/suicidal  Mitral valve disease s/p bioprosthetic valve - yearly evaluation of valve with echocardiogram prn Peripheral vascular disease - severe by ABI, recommend walking  Signed, Joellyn Rued, MD MS 01/05/2015, 12:41 AM

## 2015-01-04 NOTE — ED Notes (Signed)
Pt having active chest pain at this time. MD made aware. EKG performed and one nitro pill given.

## 2015-01-04 NOTE — ED Notes (Signed)
Pt was at home watching tv at approximately 1900, when he had an acute onset of chest pain. Pt stated his pain was in the center of his chest and described it as a pounding. Pt's defibrillator went off twice prior to EMS arrival. EMS gave 324mg  of ASA, no nitroglycerin was given. EMS vitals were BP 136/75, HR 85, Resp 20, and 95% on 2L [place]. 20 g IV placed in left wrist.

## 2015-01-05 ENCOUNTER — Encounter (HOSPITAL_COMMUNITY): Payer: Self-pay | Admitting: *Deleted

## 2015-01-05 DIAGNOSIS — Z950 Presence of cardiac pacemaker: Secondary | ICD-10-CM

## 2015-01-05 DIAGNOSIS — I25118 Atherosclerotic heart disease of native coronary artery with other forms of angina pectoris: Secondary | ICD-10-CM

## 2015-01-05 DIAGNOSIS — I255 Ischemic cardiomyopathy: Secondary | ICD-10-CM

## 2015-01-05 DIAGNOSIS — J449 Chronic obstructive pulmonary disease, unspecified: Secondary | ICD-10-CM

## 2015-01-05 DIAGNOSIS — I4891 Unspecified atrial fibrillation: Secondary | ICD-10-CM

## 2015-01-05 DIAGNOSIS — Z720 Tobacco use: Secondary | ICD-10-CM

## 2015-01-05 LAB — BASIC METABOLIC PANEL
ANION GAP: 9 (ref 5–15)
Anion gap: 10 (ref 5–15)
BUN: 15 mg/dL (ref 6–20)
BUN: 22 mg/dL — ABNORMAL HIGH (ref 6–20)
CALCIUM: 9 mg/dL (ref 8.9–10.3)
CO2: 30 mmol/L (ref 22–32)
CO2: 27 mmol/L (ref 22–32)
CREATININE: 1.07 mg/dL (ref 0.61–1.24)
Calcium: 9.2 mg/dL (ref 8.9–10.3)
Chloride: 100 mmol/L — ABNORMAL LOW (ref 101–111)
Chloride: 98 mmol/L — ABNORMAL LOW (ref 101–111)
Creatinine, Ser: 1.34 mg/dL — ABNORMAL HIGH (ref 0.61–1.24)
GFR calc Af Amer: >60 mL/min (ref >60)
GFR calc Af Amer: >60 mL/min (ref >60)
GFR calc non Af Amer: 53 mL/min — ABNORMAL LOW (ref >60)
GLUCOSE: 123 mg/dL — AB (ref 65–99)
Glucose, Bld: 161 mg/dL — ABNORMAL HIGH (ref 65–99)
Potassium: 4.3 mmol/L (ref 3.5–5.1)
Potassium: 4.5 mmol/L (ref 3.5–5.1)
SODIUM: 135 mmol/L (ref 135–145)
Sodium: 139 mmol/L (ref 135–145)

## 2015-01-05 LAB — CBC
HEMATOCRIT: 44.1 % (ref 39.0–52.0)
Hemoglobin: 14.3 g/dL (ref 13.0–17.0)
MCH: 29 pg (ref 26.0–34.0)
MCHC: 32.4 g/dL (ref 30.0–36.0)
MCV: 89.5 fL (ref 78.0–100.0)
Platelets: 219 10*3/uL (ref 150–400)
RBC: 4.93 MIL/uL (ref 4.22–5.81)
RDW: 14.8 % (ref 11.5–15.5)
WBC: 11.5 10*3/uL — ABNORMAL HIGH (ref 4.0–10.5)

## 2015-01-05 LAB — PROTIME-INR
INR: 1.27 (ref 0.00–1.49)
Prothrombin Time: 16 seconds — ABNORMAL HIGH (ref 11.6–15.2)

## 2015-01-05 MED ORDER — ATORVASTATIN CALCIUM 20 MG PO TABS
20.0000 mg | ORAL_TABLET | Freq: Every day | ORAL | Status: DC
  Administered 2015-01-05 – 2015-01-06 (×2): 20 mg via ORAL
  Filled 2015-01-05 (×2): qty 1

## 2015-01-05 MED ORDER — ISOSORBIDE MONONITRATE ER 30 MG PO TB24
30.0000 mg | ORAL_TABLET | Freq: Every day | ORAL | Status: DC
  Administered 2015-01-05: 30 mg via ORAL
  Filled 2015-01-05 (×2): qty 1

## 2015-01-05 MED ORDER — ALBUTEROL SULFATE (2.5 MG/3ML) 0.083% IN NEBU: 3.0000 mL | INHALATION_SOLUTION | Freq: Four times a day (QID) | RESPIRATORY_TRACT | Status: DC | PRN

## 2015-01-05 MED ORDER — APIXABAN 5 MG PO TABS
5.0000 mg | ORAL_TABLET | Freq: Two times a day (BID) | ORAL | Status: DC
  Administered 2015-01-05 – 2015-01-06 (×3): 5 mg via ORAL
  Filled 2015-01-05 (×5): qty 1

## 2015-01-05 MED ORDER — SODIUM CHLORIDE 0.9 % IV SOLN: 250.0000 mL | INTRAVENOUS | Status: DC | PRN

## 2015-01-05 MED ORDER — ASPIRIN 81 MG PO CHEW
81.0000 mg | CHEWABLE_TABLET | Freq: Every day | ORAL | Status: DC
  Administered 2015-01-05 – 2015-01-06 (×2): 81 mg via ORAL
  Filled 2015-01-05 (×2): qty 1

## 2015-01-05 MED ORDER — PNEUMOCOCCAL VAC POLYVALENT 25 MCG/0.5ML IJ INJ
0.5000 mL | INJECTION | INTRAMUSCULAR | Status: AC
  Administered 2015-01-06: 0.5 mL via INTRAMUSCULAR
  Filled 2015-01-05: qty 0.5

## 2015-01-05 MED ORDER — NICOTINE 21 MG/24HR TD PT24
21.0000 mg | MEDICATED_PATCH | Freq: Every day | TRANSDERMAL | Status: DC
  Administered 2015-01-05 – 2015-01-06 (×2): 21 mg via TRANSDERMAL
  Filled 2015-01-05 (×2): qty 1

## 2015-01-05 MED ORDER — FENOFIBRATE 54 MG PO TABS
54.0000 mg | ORAL_TABLET | Freq: Every day | ORAL | Status: DC
  Administered 2015-01-05 – 2015-01-06 (×2): 54 mg via ORAL
  Filled 2015-01-05 (×2): qty 1

## 2015-01-05 MED ORDER — SACUBITRIL-VALSARTAN 24-26 MG PO TABS
1.0000 | ORAL_TABLET | Freq: Two times a day (BID) | ORAL | Status: DC
  Administered 2015-01-05 (×2): 1 via ORAL
  Filled 2015-01-05 (×5): qty 1

## 2015-01-05 MED ORDER — FUROSEMIDE 40 MG PO TABS
40.0000 mg | ORAL_TABLET | Freq: Two times a day (BID) | ORAL | Status: DC
  Administered 2015-01-05: 40 mg via ORAL
  Filled 2015-01-05 (×4): qty 1

## 2015-01-05 MED ORDER — FUROSEMIDE 10 MG/ML IJ SOLN
40.0000 mg | Freq: Four times a day (QID) | INTRAMUSCULAR | Status: DC
  Administered 2015-01-05 (×2): 40 mg via INTRAVENOUS
  Filled 2015-01-05 (×3): qty 4

## 2015-01-05 MED ORDER — CARVEDILOL 6.25 MG PO TABS
6.2500 mg | ORAL_TABLET | Freq: Two times a day (BID) | ORAL | Status: DC
  Administered 2015-01-05 – 2015-01-06 (×3): 6.25 mg via ORAL
  Filled 2015-01-05 (×5): qty 1

## 2015-01-05 MED ORDER — SODIUM CHLORIDE 0.9 % IJ SOLN: 3.0000 mL | INTRAMUSCULAR | Status: DC | PRN

## 2015-01-05 MED ORDER — SODIUM CHLORIDE 0.9 % IJ SOLN
3.0000 mL | Freq: Two times a day (BID) | INTRAMUSCULAR | Status: DC
  Administered 2015-01-05 – 2015-01-06 (×3): 3 mL via INTRAVENOUS

## 2015-01-05 NOTE — Progress Notes (Signed)
Patient alert and oriented x4 throughout shift.  Patient's BP this evening 83/71 with Dinamap, 96/52 manually.  Per cardiologist on call, ok to give ordered furosemide.  Also requested order for nicotine patch.  Patient up to walk the hall x2 during shift.  Patient denies any questions or concerns at this time.  Will continue to monitor.

## 2015-01-05 NOTE — Discharge Instructions (Signed)

## 2015-01-05 NOTE — Progress Notes (Signed)
SUBJECTIVE: Denies chest pain, shortness of breath, and leg swelling. Has some leg cramps. K 4.3.     Intake/Output Summary (Last 24 hours) at 01/05/15 0943 Last data filed at 01/05/15 0709  Gross per 24 hour  Intake    120 ml  Output   1600 ml  Net  -1480 ml    Current Facility-Administered Medications  Medication Dose Route Frequency Provider Last Rate Last Dose  . 0.9 %  sodium chloride infusion  250 mL Intravenous PRN Joellyn Rued, MD      . albuterol (PROVENTIL) (2.5 MG/3ML) 0.083% nebulizer solution 3 mL  3 mL Inhalation Q6H PRN Joellyn Rued, MD      . apixaban (ELIQUIS) tablet 5 mg  5 mg Oral BID Joellyn Rued, MD   5 mg at 01/05/15 0221  . fenofibrate tablet 54 mg  54 mg Oral Daily Joellyn Rued, MD      . furosemide (LASIX) injection 40 mg  40 mg Intravenous Q6H Joellyn Rued, MD   40 mg at 01/05/15 0848  . isosorbide mononitrate (IMDUR) 24 hr tablet 30 mg  30 mg Oral Daily Joellyn Rued, MD      . nitroGLYCERIN (NITROSTAT) SL tablet 0.4 mg  0.4 mg Sublingual Q5 min PRN Ames Dura, MD   0.4 mg at 01/04/15 2103  . [START ON 01/06/2015] pneumococcal 23 valent vaccine (PNU-IMMUNE) injection 0.5 mL  0.5 mL Intramuscular Tomorrow-1000 [name], MD      . sacubitril-valsartan (ENTRESTO) 24-26 mg per tablet  1 tablet Oral BID Joellyn Rued, MD   1 tablet at 01/05/15 0221  . sodium chloride 0.9 % injection 3 mL  3 mL Intravenous Q12H Joellyn Rued, MD      . sodium chloride 0.9 % injection 3 mL  3 mL Intravenous PRN Joellyn Rued, MD        Filed Vitals:   01/05/15 0015 01/05/15 0127 01/05/15 0617 01/05/15 0849  BP: 121/59 129/69 128/64 141/57  Pulse: 87 64 68 64  Temp:  97.6 F (36.4 C) 97.3 F (36.3 C)   TempSrc:  Oral Oral   Resp: Height:   (1.702 m)    Weight:  159 lb 11.2 oz (72.439 kg)    SpO2: 98% 98% 100% 96%    PHYSICAL EXAM General: NAD HEENT: Normal. Neck: No JVD, no thyromegaly.  Lungs: Clear to  auscultation bilaterally with normal respiratory effort. CV: Nondisplaced PMI.  Regular rate and rhythm, normal S1/S2, no S3/S4, no murmur.  No pretibial edema.   Abdomen: Soft, nontender, no distention.  Neurologic: Alert and oriented.  Psych: Normal affect. Musculoskeletal: No gross deformities. Extremities: No clubbing or cyanosis.   TELEMETRY: Reviewed telemetry pt in ventricular paced rhythm.  LABS: Basic Metabolic Panel:  Recent Labs  78[birth year] 1953 01/05/15 0755  NA 138 139  K 4.5 4.3  CL 104 100*  CO2 25 30  GLUCOSE 112* 123*  BUN 16 15  CREATININE 1.08 1.07  CALCIUM 9.2 9.2   Liver Function Tests: No results for input(s): AST, ALT, ALKPHOS, BILITOT, PROT, ALBUMIN in the last 72 hours. No results for input(s): LIPASE, AMYLASE in the last 72 hours. CBC:  Recent Labs  01/04/15 1953 01/05/15 0755  WBC 11.8* 11.5*  HGB 13.0 14.3  HCT 39.6 44.1  MCV 88.6 89.5  PLT 191 219   Cardiac Enzymes:  Recent Labs  01/04/15  1953  TROPONINI 0.08*   BNP: Invalid input(s): POCBNP D-Dimer: No results for input(s): DDIMER in the last 72 hours. Hemoglobin A1C: No results for input(s): HGBA1C in the last 72 hours. Fasting Lipid Panel: No results for input(s): CHOL, HDL, LDLCALC, TRIG, CHOLHDL, LDLDIRECT in the last 72 hours. Thyroid Function Tests: No results for input(s): TSH, T4TOTAL, T3FREE, THYROIDAB in the last 72 hours.  Invalid input(s): FREET3 Anemia Panel: No results for input(s): VITAMINB12, FOLATE, FERRITIN, TIBC, IRON, RETICCTPCT in the last 72 hours.  RADIOLOGY: Dg Chest 2 View  12/24/2014   CLINICAL DATA:  Acute onset of shortness of breath. Initial encounter.  EXAM: CHEST  2 VIEW  COMPARISON:  Chest radiograph performed 08/30/2014  FINDINGS: The lungs are well-aerated. Mild bibasilar opacities may reflect atelectasis or possibly mild pneumonia. Peribronchial thickening is noted. There is no evidence of pleural effusion or pneumothorax.  The heart is  mildly enlarged. A pacemaker/AICD is noted at the left chest wall, with leads ending at the right atrium and right ventricle. The patient is status post median sternotomy. A valve replacement is noted. No acute osseous abnormalities are seen.  IMPRESSION: Mild bibasilar opacities may reflect atelectasis or possibly mild pneumonia. Mild cardiomegaly noted. Peribronchial thickening seen.   Electronically Signed   By: Roanna Raider M.D.   On: 12/24/2014 03:36   Dg Chest Port 1 View  01/04/2015   CLINICAL DATA:  Chest pain and shortness of breath tonight.  EXAM: PORTABLE CHEST - 1 VIEW  COMPARISON:  12/24/2014  FINDINGS: Sternotomy wires and left-sided pacemaker are unchanged. Prostatic are valve unchanged. Lungs are adequately inflated and demonstrate mild prominence of the perihilar markings with mild hazy left base opacification. Mild stable cardiomegaly. Remainder of the exam is unchanged.  IMPRESSION: Mild cardiomegaly with findings suggesting minimal interstitial edema. Cannot exclude an effusion/atelectasis versus infection in the left base.   Electronically Signed   By: Elberta Fortis M.D.   On: 01/04/2015 20:23      ASSESSMENT AND PLAN: This is a 69 y.o. 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 He had mild CHF.  ICD discharge x 2 per report by the patient, moderate to severe, acute in nature \/2015 s/p MDT [id number] Viva XT CRT-D change to CRT D in 2009 - electrolytes wnl - ordered device interrogation to evaluate for possible etiology of shock - reviewed ICD interrogation, no ICD discharge or ATP per interrogation -  probably felt forceful palpitation  Acute on chronic systolic heart failure NYHA class III, AHA stage C w/ pulmonary hypertension, etiology is both ischemic and non ischemic secondary to alcohol abuse CXR showed minimal intersitital edema, mildly elevated BNP. However optivol evaluation did not demonstrate significant changes in impedence on interrogation of MDT device - Has had 1.5 L output. Will switch back to oral Lasix. - strict I/os, daily weights, 2 g low salt diet, restrict fluids to 1500 ml/day - continue entresto - consider restarting spirolactone in future - will restart Coreg  Chest pain, atypical in nature, likely etiology is musculoskeletal - chronically low level elevated troponin, 0.08 - NTG as needed - will restart ASA, Lipitor, and Coreg  Elevated troponin, in the setting of normal EKG Indeterminate range, similar to last admission  Chronic coronary artery disease, severe in nature and chronic in temporality Last cath in 08/2014 rior PCI/stenting of the LCX; cath on 08/2014 showed EF 20%, large septal, apical, and inferior infarct from apex to base, no ischemia; c. 08/2014 NSTEMI/Cath: LM mod distal dzs extending into ostial LCX (80%), LAD tortuous, RI nl, OM mod dzs, RCA 100 CTO with R->R and L->R collats-->Med Rx was recommended - will restart ASA, Lipitor, and Coreg. On Imdur.   Tobacco abuse, ongoing  Does want to quit   Chronic atrial fibrillation CHADS2VASc = 6 HAS BLED score 3 Patient is currently on eliquis Continue anti-coagulation  Alcohol abuse drinks 8-20 ounces/week Working on quitting.  Prentice Docker, M.D., F.A.C.C.

## 2015-01-05 NOTE — Plan of Care (Signed)
Endocrine: no diabetes, no hypothyroidism Immunological: no lymph adenopathy Psych: non homicidal/suicidal

## 2015-01-05 NOTE — Progress Notes (Signed)
Nutrition Brief Note  Patient identified on the Malnutrition Screening Tool (MST) Report  Wt Readings from Last 15 Encounters:  01/05/15 159 lb 11.2 oz (72.439 kg)  12/27/14 154 lb 4.8 oz (69.99 kg)  10/12/14 161 lb 12 oz (73.369 kg)  10/05/14 161 lb 6.4 oz (73.211 kg)  09/18/14 157 lb 6.4 oz (71.396 kg)  09/05/14 153 lb (69.4 kg)  08/30/14 155 lb 12.8 oz (70.67 kg)  08/21/14 150 lb 5.7 oz (68.2 kg)  07/31/14 150 lb (68.04 kg)  03/30/14 149 lb 4 oz (67.699 kg)  02/28/14 144 lb 8 oz (65.545 kg)  02/28/14 145 lb (65.772 kg)  02/13/14 145 lb (65.772 kg)  02/09/14 135 lb 6.4 oz (61.417 kg)  02/01/14 136 lb (61.689 kg)    Body mass index is 25.01 kg/(m^2). Patient meets criteria for overweight based on current BMI.   Pt reports that his weight loss is from a long time ago and recently he has been eating well. Unfortunately, due to his social circumstances and medical conditions he has been unable to be educated to follow a low sodium diet. He  says he watches his sodium intake, but then talked about how he loves fast food. He has hx of ETOH + Tobacco abuse.   Current diet order is Renal w/ 1500 ml fluid restriction. There is no documented intake of meals. Labs and medications reviewed.    If nutrition issues arise, please consult RD.   Christophe Louis RD, LDN Nutrition Pager: [phone] 01/05/2015 11:08 AM

## 2015-01-06 DIAGNOSIS — I5023 Acute on chronic systolic (congestive) heart failure: Secondary | ICD-10-CM | POA: Diagnosis not present

## 2015-01-06 DIAGNOSIS — N170 Acute kidney failure with tubular necrosis: Secondary | ICD-10-CM

## 2015-01-06 LAB — BASIC METABOLIC PANEL
ANION GAP: 10 (ref 5–15)
BUN: 22 mg/dL — ABNORMAL HIGH (ref 6–20)
CALCIUM: 8.8 mg/dL — AB (ref 8.9–10.3)
CO2: 27 mmol/L (ref 22–32)
Chloride: 97 mmol/L — ABNORMAL LOW (ref 101–111)
Creatinine, Ser: 1.1 mg/dL (ref 0.61–1.24)
GFR calc Af Amer: >60 mL/min (ref >60)
Glucose, Bld: 200 mg/dL — ABNORMAL HIGH (ref 65–99)
POTASSIUM: 4 mmol/L (ref 3.5–5.1)
SODIUM: 134 mmol/L — AB (ref 135–145)

## 2015-01-06 MED ORDER — NITROGLYCERIN 0.4 MG SL SUBL: 0.4000 mg | SUBLINGUAL_TABLET | SUBLINGUAL | Status: DC | PRN

## 2015-01-06 MED ORDER — FUROSEMIDE 40 MG PO TABS: 40.0000 mg | ORAL_TABLET | Freq: Two times a day (BID) | ORAL | Status: DC

## 2015-01-06 MED ORDER — ISOSORBIDE MONONITRATE ER 30 MG PO TB24: 30.0000 mg | ORAL_TABLET | Freq: Every day | ORAL | Status: DC

## 2015-01-06 MED ORDER — SACUBITRIL-VALSARTAN 24-26 MG PO TABS: 1.0000 | ORAL_TABLET | Freq: Two times a day (BID) | ORAL | Status: DC

## 2015-01-06 NOTE — Discharge Summary (Signed)
Physician Discharge Summary  Patient ID: [name] MRN: [id number] DOB/AGE: [birth year] 67 y.o.   Primary Cardiologist: Dr. Shirlee Latch  Admit date: 01/04/2015 Discharge date: 01/06/2015  Admission Diagnoses: Acute on Chronic Systolic CHF  Discharge Diagnoses:  Principal Problem:   Acute on chronic systolic congestive heart failure   Active Problems:   Atrial fibrillation   CAD (coronary artery disease)   Severe chronic obstructive pulmonary disease   Mixed Ischemic and Nonischemic Cardiomyopathy   Persistent atrial fibrillation   Tobacco abuse   Elevated troponin   Acute combined systolic and diastolic congestive heart failure   Chest pain, rule out acute myocardial infarction   Cardiac pacemaker in situ   Discharged Condition: serious  Hospital Course: This is a 69 y.o. 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  However he did not actually have an ICD shock or ATP by interrogation report.  His chest pain was felt to be atypical and likely musculoskeletal. Troponin was 0.08 but he has a history of chronically low level elevated troponins. He was however noted to be in acute CHF and was admitted for IV diuretic therapy. BNP was 585.9. He had good diuresis, -3L total. However, his SCr and BUN increased to 1.34 and 22 respectively. Baseline Scr 1.08. His Lasix and Nelva Nay was held for 1 day on day of discharge. He was last seen and examined by Dr. Purvis Sheffield on 01/06/15 who determine he was stable for dishrage home. He recommended that he resume his Lasix and Entresto on 8/1/6. The patient has been ordered to get a f/u BMP on 01/09/15 to follow renal function. He will f/u in the advanced HF  clinic.   Consults: None   Treatments: See Hospital Course  Discharge Exam: Blood pressure 105/66, pulse 61, temperature 97.9 F (36.6 C), temperature source Oral, resp. rate 18, height  (1.702 m), weight 153 lb 8 oz (69.627 kg), SpO2 95 %.   Disposition: 01-Home or Self Care      Discharge Instructions    Diet - low sodium heart healthy    Complete by:  As directed      Discharge instructions    Complete by:  As directed   You will need to get blood work done on 01/09/15. Take your prescription to lab of your choice.  Hold your Lasix, Imdur and Entresto day of discharge. You may resume these medications on 01/07/15     Increase activity slowly    Complete by:  As directed             Medication List    STOP taking these medications        spironolactone 25 MG tablet  Commonly known as:  ALDACTONE      TAKE these medications        aspirin 81 MG chewable tablet  Chew 81 mg by mouth daily.     atorvastatin 20 MG tablet  Commonly known as:  LIPITOR  TAKE 1 TABLET BY MOUTH EVERY DAY     carvedilol 6.25 MG tablet  Commonly known as:  COREG  Take 6.25 mg by mouth 2 (two) times daily with a meal.     ELIQUIS 5 MG Tabs tablet  Generic drug:  apixaban  TAKE 1 TABLET (5 MG TOTAL) BY MOUTH 2 (TWO) TIMES DAILY.     fenofibrate 48 MG tablet  Commonly known as:  TRICOR  Take 1 tablet (48 mg total) by mouth daily.     furosemide 40 MG tablet  Commonly known as:  LASIX  Take 1 tablet (40 mg total) by mouth 2 (two) times daily.  Start taking on:  01/07/2015     isosorbide mononitrate 30 MG 24 hr tablet  Commonly known as:  IMDUR  Take 1 tablet (30 mg total) by mouth daily.  Start taking on:  01/07/2015     nitroGLYCERIN 0.4 MG SL tablet  Commonly known as:  NITROSTAT  Place 1 tablet (0.4 mg total) under the tongue every 5 (five) minutes as needed for chest pain.     sacubitril-valsartan 24-26 MG  Commonly known as:  ENTRESTO  Take 1 tablet by mouth 2 (two)  times daily.  Start taking on:  01/07/2015     VENTOLIN HFA 108 (90 BASE) MCG/ACT inhaler  Generic drug:  albuterol  Inhale 2 puffs into the lungs every 6 (six) hours as needed. Shortness of breath or wheezing       Follow-up Information    Follow up with Arvilla Meres, MD.   Specialty:  Cardiology   Why:  our office will call you with a follow-up appointment   Contact information:   [address] [place] [address] [phone]      TIME SPENT ON DISCHARGE, INCLUDING PHYSICIAN TIME: >30 MINUTES  Signed: Robbie Lis 01/06/2015, 1:37 PM
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
## 2015-01-06 NOTE — Progress Notes (Signed)
Orders received for pt discharge.  Discharge summary printed and reviewed with pt.  Explained medication regimen, and pt had no further questions at this time.  IV removed and site remains clean, dry, intact.  Telemetry removed.  Pt in stable condition and awaiting transport with [facility].

## 2015-01-06 NOTE — Progress Notes (Signed)
Pt slept well during the night. 

## 2015-01-06 NOTE — Progress Notes (Signed)
Pt requests that we do not give any information to "[name]," should she call the nursing station.

## 2015-01-06 NOTE — Progress Notes (Signed)
SUBJECTIVE: Doing well and without complaints.     Intake/Output Summary (Last 24 hours) at 01/06/15 0824 Last data filed at 01/06/15 0540  Gross per 24 hour  Intake    693 ml  Output   2250 ml  Net  -1557 ml    Current Facility-Administered Medications  Medication Dose Route Frequency Provider Last Rate Last Dose  . 0.9 %  sodium chloride infusion  250 mL Intravenous PRN Joellyn Rued, MD      . albuterol (PROVENTIL) (2.5 MG/3ML) 0.083% nebulizer solution 3 mL  3 mL Inhalation Q6H PRN Joellyn Rued, MD      . apixaban (ELIQUIS) tablet 5 mg  5 mg Oral BID Joellyn Rued, MD   5 mg at 01/05/15 2147  . aspirin chewable tablet 81 mg  81 mg Oral Daily Laqueta Linden, MD   81 mg at 01/05/15 1019  . atorvastatin (LIPITOR) tablet 20 mg  20 mg Oral q1800 Laqueta Linden, MD   20 mg at 01/05/15 1732  . carvedilol (COREG) tablet 6.25 mg  6.25 mg Oral BID WC Laqueta Linden, MD   6.25 mg at 01/06/15 1610  . fenofibrate tablet 54 mg  54 mg Oral Daily Joellyn Rued, MD   54 mg at 01/05/15 1019  . nicotine (NICODERM CQ - dosed in mg/24 hours) patch 21 mg  21 mg Transdermal Daily Sulaiman Alonna Minium, MD   21 mg at 01/05/15 1845  . nitroGLYCERIN (NITROSTAT) SL tablet 0.4 mg  0.4 mg Sublingual Q5 min PRN Ames Dura, MD   0.4 mg at 01/04/15 2103  . pneumococcal 23 valent vaccine (PNU-IMMUNE) injection 0.5 mL  0.5 mL Intramuscular Tomorrow-1000 [name], MD      . sodium chloride 0.9 % injection 3 mL  3 mL Intravenous Q12H Joellyn Rued, MD   3 mL at 01/05/15 2147  . sodium chloride 0.9 % injection 3 mL  3 mL Intravenous PRN Joellyn Rued, MD        Filed Vitals:   01/05/15 1728 01/05/15 1800 01/05/15 2100 01/06/15 0539  BP: 96/52 101/59 111/54 133/69  Pulse:  65 60 74  Temp:   97.6 F (36.4 C) 97.8 F (36.6 C)  TempSrc:   Oral Oral  Resp:  18 18 18   Height:      Weight:    153 lb 8 oz (69.627 kg)  SpO2:  94% 94% 93%    PHYSICAL EXAM General:  NAD HEENT: Normal. Neck: No JVD, no thyromegaly.  Lungs: Clear to auscultation bilaterally with normal respiratory effort. CV: Nondisplaced PMI. Regular rate and rhythm, normal S1/S2, no S3/S4, no murmur. No pretibial edema.  Abdomen: Soft, nontender, no distention.  Neurologic: Alert and oriented.  Psych: Normal affect. Musculoskeletal: No gross deformities. Extremities: No clubbing or cyanosis.   TELEMETRY: Reviewed telemetry pt in ventricular paced rhythm.  LABS: Basic Metabolic Panel:  Recent Labs  96/04/54 0755 01/05/15 1803  NA 139 135  K 4.3 4.5  CL 100* 98*  CO2 30 27  GLUCOSE 123* 161*  BUN 15 22*  CREATININE 1.07 1.34*  CALCIUM 9.2 9.0   Liver Function Tests: No results for input(s): AST, ALT, ALKPHOS, BILITOT, PROT, ALBUMIN in the last 72 hours. No results for input(s): LIPASE, AMYLASE in the last 72 hours. CBC:  Recent Labs  01/04/15 1953 01/05/15 0755  WBC 11.8* 11.5*  HGB 13.0 14.3  HCT 39.6 44.1  MCV 88.6 89.5  PLT 191 219   Cardiac Enzymes:  Recent Labs  01/04/15 1953  TROPONINI 0.08*   BNP: Invalid input(s): POCBNP D-Dimer: No results for input(s): DDIMER in the last 72 hours. Hemoglobin A1C: No results for input(s): HGBA1C in the last 72 hours. Fasting Lipid Panel: No results for input(s): CHOL, HDL, LDLCALC, TRIG, CHOLHDL, LDLDIRECT in the last 72 hours. Thyroid Function Tests: No results for input(s): TSH, T4TOTAL, T3FREE, THYROIDAB in the last 72 hours.  Invalid input(s): FREET3 Anemia Panel: No results for input(s): VITAMINB12, FOLATE, FERRITIN, TIBC, IRON, RETICCTPCT in the last 72 hours.  RADIOLOGY: Dg Chest 2 View  12/24/2014   CLINICAL DATA:  Acute onset of shortness of breath. Initial encounter.  EXAM: CHEST  2 VIEW  COMPARISON:  Chest radiograph performed 08/30/2014  FINDINGS: The lungs are well-aerated. Mild bibasilar opacities may reflect atelectasis or possibly mild pneumonia. Peribronchial thickening is noted.  There is no evidence of pleural effusion or pneumothorax.  The heart is mildly enlarged. A pacemaker/AICD is noted at the left chest wall, with leads ending at the right atrium and right ventricle. The patient is status post median sternotomy. A valve replacement is noted. No acute osseous abnormalities are seen.  IMPRESSION: Mild bibasilar opacities may reflect atelectasis or possibly mild pneumonia. Mild cardiomegaly noted. Peribronchial thickening seen.   Electronically Signed   By: Roanna Raider M.D.   On: 12/24/2014 03:36   Dg Chest Port 1 View  01/04/2015   CLINICAL DATA:  Chest pain and shortness of breath tonight.  EXAM: PORTABLE CHEST - 1 VIEW  COMPARISON:  12/24/2014  FINDINGS: Sternotomy wires and left-sided pacemaker are unchanged. Prostatic are valve unchanged. Lungs are adequately inflated and demonstrate mild prominence of the perihilar markings with mild hazy left base opacification. Mild stable cardiomegaly. Remainder of the exam is unchanged.  IMPRESSION: Mild cardiomegaly with findings suggesting minimal interstitial edema. Cannot exclude an effusion/atelectasis versus infection in the left base.   Electronically Signed   By: Elberta Fortis M.D.   On: 01/04/2015 20:23      ASSESSMENT AND PLAN: This is a 69 y.o. 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 He had mild CHF.  ICD discharge x 2 per report by the patient, moderate to severe, acute in nature \/2015 s/p MDT DTBA1D1 Viva XT CRT-D change to CRT D in 2009 - electrolytes were wnl on admission - ordered device interrogation to evaluate for possible  etiology of shock - reviewed ICD interrogation, no ICD discharge or ATP per interrogation - probably felt forceful palpitation  Acute on chronic systolic heart failure NYHA class III, AHA stage C w/ pulmonary hypertension, etiology is both ischemic and non ischemic secondary to alcohol abuse CXR showed minimal intersitital edema, mildly elevated BNP. However optivol evaluation did not demonstrate significant changes in impedence on interrogation of MDT device - Has had 2.5 L output in last 24 hours. However, increase in BUN and creatinine (also noted to be hypotensive). Will hold morning Lasix and will hold both Imdur and Entresto today. Should have repeat BMET on Wednesday or thereabouts.   Chest pain, atypical in nature, likely  etiology is musculoskeletal - chronically low level elevated troponin, 0.08 - NTG as needed - continue ASA, Lipitor, and Coreg  Elevated troponin, in the setting of normal EKG Indeterminate range, similar to last admission  Chronic coronary artery disease, severe in nature and chronic in temporality Last cath in 08/2014 rior PCI/stenting of the LCX; cath on 08/2014 showed EF 20%, large septal, apical, and inferior infarct from apex to base, no ischemia; c. 08/2014 NSTEMI/Cath: LM mod distal dzs extending into ostial LCX (80%), LAD tortuous, RI nl, OM mod dzs, RCA 100 CTO with R->R and L->R collats-->Med Rx was recommended - will restart ASA, Lipitor, and Coreg. On Imdur.   Tobacco abuse, ongoing  Does want to quit   Chronic atrial fibrillation CHADS2VASc = 6 HAS BLED score 3 Patient is currently on eliquis Continue anti-coagulation  Alcohol abuse drinks 8-20 ounces/week Working on quitting.  Acute renal failure Likely due to ATN from hypotension. Hold Imdur and Entresto today, repeat BMET later this week.  Dispo: Stable for discharge.  Prentice Docker, M.D., F.A.C.C.

## 2015-01-07 ENCOUNTER — Encounter: Payer: Commercial Managed Care - HMO | Admitting: *Deleted

## 2015-01-08 ENCOUNTER — Encounter: Payer: Self-pay | Admitting: *Deleted

## 2015-01-08 ENCOUNTER — Encounter: Payer: Self-pay | Admitting: Licensed Clinical Social Worker

## 2015-01-08 ENCOUNTER — Ambulatory Visit: Payer: Commercial Managed Care - HMO | Admitting: *Deleted

## 2015-01-08 NOTE — Patient Outreach (Signed)
  [facility]) Care Management  [facility] Social Work  01/08/2015  [name] 11-27-1945 [id number]   Current Medications:  Current Outpatient Prescriptions  Medication Sig Dispense Refill  . aspirin 81 MG chewable tablet Chew 81 mg by mouth daily.    Marland Kitchen atorvastatin (LIPITOR) 20 MG tablet TAKE 1 TABLET BY MOUTH EVERY DAY 30 tablet 3  . carvedilol (COREG) 6.25 MG tablet Take 6.25 mg by mouth 2 (two) times daily with a meal.     . ELIQUIS 5 MG TABS tablet TAKE 1 TABLET (5 MG TOTAL) BY MOUTH 2 (TWO) TIMES DAILY. 60 tablet 3  . fenofibrate (TRICOR) 48 MG tablet Take 1 tablet (48 mg total) by mouth daily. 90 tablet 3  . furosemide (LASIX) 40 MG tablet Take 1 tablet (40 mg total) by mouth 2 (two) times daily. 30 tablet   . isosorbide mononitrate (IMDUR) 30 MG 24 hr tablet Take 1 tablet (30 mg total) by mouth daily. 30 tablet 5  . nitroGLYCERIN (NITROSTAT) 0.4 MG SL tablet Place 1 tablet (0.4 mg total) under the tongue every 5 (five) minutes as needed for chest pain. 25 tablet 2  . sacubitril-valsartan (ENTRESTO) 24-26 MG Take 1 tablet by mouth 2 (two) times daily. 60 tablet 0  . VENTOLIN HFA 108 (90 BASE) MCG/ACT inhaler Inhale 2 puffs into the lungs every 6 (six) hours as needed. Shortness of breath or wheezing     No current facility-administered medications for this visit.    Functional Status:  In your present state of health, do you have any difficulty performing the following activities: 01/05/2015 01/05/2015  Hearing? - -  Vision? - -  Difficulty concentrating or making decisions? - -  Walking or climbing stairs? - -  Dressing or bathing? - -  Doing errands, shopping? N N    Fall/Depression Screening:  No flowsheet data found.  Assessment: CSW and RNCM Rose [name] arrived to [facility] [employer] [facility] to complete initial home assessment. CSW knocked on the door several times and then called room number to reach patient for scheduled appointment with no success. RNCM and  CSW went to front desk but did not succeed in reaching patient either this way. Staff were informed that today was his check out day. While walking back front desk, a male resident stated "He left yesterday but [name] is still in there." RNCM and CSW did not reply. [facility] staff are aware that previous note in chart stated that he did not wish to give out any information to [name] who he had previously given permission to release information to. RNCM and CSW did not go back to room.   Plan: CSW will updated patient's PCP that we were unable to reach patient after multiple attempts. CSW will inform CMA Nena Polio of case closure.  [name], BSW, MSW, LCSW [email or web address] Phone: [phone] Fax: [phone]

## 2015-01-08 NOTE — Patient Outreach (Signed)
[facility]) Care Management  01/08/2015  [name] April 03, 1946 [id number]   Documentation:  Co visit with [name] scheduled with pt today.   However, this RN CM  arrived with [name] at [employer] (pt last reported was staying), knocked on room door,[name] rang door bell/called room- no response to all attempts.   RN CM and SW went to front desk of [name], was told by hotel manager  today was planned check out day for pt to which RN CM and SW decided to go back to room to try again.   On the way to pt's motel room, another resident of the motel reports  pt left yesterday, [name] still in the room.  Prior to arrival to [name], RN CM viewed in Epic= pt had a hospital admission 7/29 (chest pain),discharged 7/31 and while in the hospital requested do not give any information to [name] (pt placed [name]- contact  on [facility] consent form during prior hospitalization) if she calls nursing station.   Therefore, this RN CM and SW did not return to motel room again.     Plan to inform [name]  And  [name]  Unable to contact pt, plan to close case.  Plan to let  [facility] CMA know to close case, unable to contact.       [name]   [name] RN CCM [facility] Care Management  [phone]

## 2015-01-09 ENCOUNTER — Encounter: Payer: Self-pay | Admitting: Cardiology

## 2015-01-09 ENCOUNTER — Ambulatory Visit: Payer: Commercial Managed Care - HMO | Admitting: Licensed Clinical Social Worker

## 2015-01-14 NOTE — Patient Outreach (Signed)
[facility]) Care Management  01/14/2015  [name] 20-Sep-1945 [id number]   Notification from [name], LCSW and [name], RN to close case as unable to contact patient for [facility] Care Management services.  [name]. [name] [facility] Care Management [facility] CM Assistant Phone: [phone] Fax: [phone]

## 2015-01-22 ENCOUNTER — Encounter (HOSPITAL_COMMUNITY): Payer: Commercial Managed Care - HMO

## 2015-01-27 ENCOUNTER — Emergency Department (HOSPITAL_COMMUNITY)
Admission: EM | Admit: 2015-01-27 | Discharge: 2015-01-28 | Disposition: A | Discharge status: Home / Self Care | Payer: Commercial Managed Care - HMO | Attending: Emergency Medicine | Admitting: Emergency Medicine

## 2015-01-27 ENCOUNTER — Emergency Department (HOSPITAL_COMMUNITY): Payer: Commercial Managed Care - HMO

## 2015-01-27 ENCOUNTER — Encounter (HOSPITAL_COMMUNITY): Payer: Self-pay | Admitting: *Deleted

## 2015-01-27 DIAGNOSIS — I4891 Unspecified atrial fibrillation: Secondary | ICD-10-CM | POA: Diagnosis not present

## 2015-01-27 DIAGNOSIS — Z951 Presence of aortocoronary bypass graft: Secondary | ICD-10-CM | POA: Diagnosis present

## 2015-01-27 DIAGNOSIS — J449 Chronic obstructive pulmonary disease, unspecified: Secondary | ICD-10-CM | POA: Insufficient documentation

## 2015-01-27 DIAGNOSIS — Z9889 Other specified postprocedural states: Secondary | ICD-10-CM | POA: Diagnosis not present

## 2015-01-27 DIAGNOSIS — I10 Essential (primary) hypertension: Secondary | ICD-10-CM | POA: Diagnosis not present

## 2015-01-27 DIAGNOSIS — Z95810 Presence of automatic (implantable) cardiac defibrillator: Secondary | ICD-10-CM | POA: Diagnosis not present

## 2015-01-27 DIAGNOSIS — Z950 Presence of cardiac pacemaker: Secondary | ICD-10-CM

## 2015-01-27 DIAGNOSIS — I5022 Chronic systolic (congestive) heart failure: Secondary | ICD-10-CM | POA: Diagnosis not present

## 2015-01-27 DIAGNOSIS — Z8659 Personal history of other mental and behavioral disorders: Secondary | ICD-10-CM | POA: Insufficient documentation

## 2015-01-27 DIAGNOSIS — Z86718 Personal history of other venous thrombosis and embolism: Secondary | ICD-10-CM | POA: Insufficient documentation

## 2015-01-27 DIAGNOSIS — Z720 Tobacco use: Secondary | ICD-10-CM | POA: Insufficient documentation

## 2015-01-27 DIAGNOSIS — E785 Hyperlipidemia, unspecified: Secondary | ICD-10-CM | POA: Insufficient documentation

## 2015-01-27 DIAGNOSIS — Z8719 Personal history of other diseases of the digestive system: Secondary | ICD-10-CM | POA: Diagnosis not present

## 2015-01-27 DIAGNOSIS — Z8673 Personal history of transient ischemic attack (TIA), and cerebral infarction without residual deficits: Secondary | ICD-10-CM | POA: Insufficient documentation

## 2015-01-27 DIAGNOSIS — Z7982 Long term (current) use of aspirin: Secondary | ICD-10-CM | POA: Insufficient documentation

## 2015-01-27 DIAGNOSIS — Z79899 Other long term (current) drug therapy: Secondary | ICD-10-CM | POA: Insufficient documentation

## 2015-01-27 DIAGNOSIS — I2510 Atherosclerotic heart disease of native coronary artery without angina pectoris: Secondary | ICD-10-CM | POA: Insufficient documentation

## 2015-01-27 LAB — CBC WITH DIFFERENTIAL/PLATELET
BASOS ABS: 0.1 10*3/uL (ref 0.0–0.1)
BASOS PCT: 1 % (ref 0–1)
Eosinophils Absolute: 0.3 10*3/uL (ref 0.0–0.7)
Eosinophils Relative: 3 % (ref 0–5)
HEMATOCRIT: 42.1 % (ref 39.0–52.0)
HEMOGLOBIN: 13.5 g/dL (ref 13.0–17.0)
LYMPHS PCT: 19 % (ref 12–46)
Lymphs Abs: 2 10*3/uL (ref 0.7–4.0)
MCH: 28.7 pg (ref 26.0–34.0)
MCHC: 32.1 g/dL (ref 30.0–36.0)
MCV: 89.4 fL (ref 78.0–100.0)
Monocytes Absolute: 0.9 10*3/uL (ref 0.1–1.0)
Monocytes Relative: 9 % (ref 3–12)
NEUTROS ABS: 7.2 10*3/uL (ref 1.7–7.7)
NEUTROS PCT: 68 % (ref 43–77)
Platelets: 175 10*3/uL (ref 150–400)
RBC: 4.71 MIL/uL (ref 4.22–5.81)
RDW: 14.7 % (ref 11.5–15.5)
WBC: 10.5 10*3/uL (ref 4.0–10.5)

## 2015-01-27 NOTE — ED Provider Notes (Signed)
CSN: [id number]     Arrival date & time 01/27/15  2229 History  This chart was scribed for Dominic Crumble, MD by Leone Payor, ED Scribe. This patient was seen in room A05C/A05C and the patient's care was started 11:16 PM.    Chief Complaint  Patient presents with  . Pacemaker Problem    The history is provided by the patient. No language interpreter was used.     HPI Comments: [name] is a 69 y.o. male with past medical history of pacemaker, Afib, CAD, CVA who presents to the Emergency Department complaining of a pacemaker problem that began about 3 hours ago. Patient states he was watching TV when he noticed his pacemaker began humming; states it lasted a few seconds at a time. He denies any associated pain or symptoms at this time. He denies alleviating or aggravating factors. He reports similar symptoms in the past but with humming and a shocking sensation. He states he does not have an appointment with his cardiologist currently. He denies associated pain, dizziness, SOB, vomiting, diarrhea.   Past Medical History  Diagnosis Date  . DVT (deep venous thrombosis)   . Mitral valve disease     a. remote mitral replacement with Bjork Shiley valve;  b. 06/2006 Redo MVR with tissue valve.  Marland Kitchen COPD (chronic obstructive pulmonary disease)   . Depression   . Hypertension   . Dyslipidemia   . Ventral hernia   . CAD (coronary artery disease)     a. s/p prior PCI/stenting of the LCX;  b. 08/2014 MV: EF 20%, large septal, apical, and inferior infarct from apex to base, no ischemia;  c. 08/2014 NSTEMI/Cath: LM mod distal dzs extending into ostial LCX (80%), LAD tortuous, RI nl, OM mod dzs, RCA 100 CTO with R->R and L->R collats-->Med Rx.  . Mixed Ischemic and Nonischemic Cardiomyopathy     a. 8/.2015 Echo: Ef 25-30%;  b. 01/2014 s/p MDT [id number] Talbot Grumbling XT CRT-D (ser # [id number]).  . Chronic systolic congestive heart failure     a. 01/2014 Echo: EF 25-30%, mod conc LVH, mod dil LA.  . Bipolar disorder    . Anxiety   . Persistent atrial fibrillation     a. CHA2DS2VASc = 6->eliquis;  b. S/P AVN RFCA an BiV ICD placement.  . Alcohol abuse   . Complete heart block     a. In setting of prior AV nodal ablation r/t afib-->BiV ICD (01/2014).  . Stroke   . CVA (cerebral vascular accident)     a. Multiple prior embolic strokes.  . Biventricular automatic implantable cardioverter defibrillator in situ     a. 01/2014 s/p MDT [id number] Viva XT CRT-D (ser # [id number]).   Past Surgical History  Procedure Laterality Date  . Cystoscopy w/ ureteral stent placement  07/12/2011    Procedure: CYSTOSCOPY WITH RETROGRADE PYELOGRAM/URETERAL STENT PLACEMENT;  Surgeon: Sebastian Ache, MD;  Location: [facility] OR;  Service: Urology;  Laterality: Left;  . Hernia repair    . Mitral valve replacement      remote [facility] valve 8657 with redo tissue valve 06/2006  . Pacemaker placement  2006    Changed to CRT-D in 2009  . Av node ablation  2007  . Icd generator change  02/01/2014    Gen change to: Medtronic VIVA pulse generator, serial number [id number]  . Cardiac catheterization  06/2006    Mild ostial L main stenosis, CFX stent patent, RCA occluded (old)  . Coronary artery bypass graft  2008  . Pacemaker generator change N/A 02/01/2014    Procedure: PACEMAKER GENERATOR CHANGE;  Surgeon: Duke Salvia, MD;  Location: [facility] CATH LAB;  Service: Cardiovascular;  Laterality: N/A;  . Left heart catheterization with coronary angiogram N/A 08/23/2014    Procedure: LEFT HEART CATHETERIZATION WITH CORONARY ANGIOGRAM;  Surgeon: Corky Crafts, MD;  Location: [facility] CATH LAB;  Service: Cardiovascular;  Laterality: N/A;   Family History  Problem Relation Age of Onset  . Stroke    . Heart disease    . Alzheimer's disease Mother   . Heart attack Father   . Heart attack Son   . Varicose Veins Son   . Deep vein thrombosis Son    Social History  Substance Use Topics  . Smoking status: Current Every Day Smoker -- 2.00 packs/day     Types: Cigarettes    Last Attempt to Quit: 01/30/2003  . Smokeless tobacco: Never Used  . Alcohol Use: 18.0 oz/week    24 Cans of beer, 6 Standard drinks or equivalent per week     Comment: occasionally    Review of Systems  10 Systems reviewed and all are negative for acute change except as noted in the HPI.   Allergies  Warfarin  Home Medications   Prior to Admission medications   Medication Sig Start Date End Date Taking? Authorizing Provider  aspirin 81 MG chewable tablet Chew 81 mg by mouth daily.    Historical Provider, MD  atorvastatin (LIPITOR) 20 MG tablet TAKE 1 TABLET BY MOUTH EVERY DAY 05/31/14   [name], MD  carvedilol (COREG) 6.25 MG tablet Take 6.25 mg by mouth 2 (two) times daily with a meal.  02/01/14   Historical Provider, MD  ELIQUIS 5 MG TABS tablet TAKE 1 TABLET (5 MG TOTAL) BY MOUTH 2 (TWO) TIMES DAILY. 11/12/14   [name], MD  fenofibrate (TRICOR) 48 MG tablet Take 1 tablet (48 mg total) by mouth daily. 10/22/14   [name], MD  furosemide (LASIX) 40 MG tablet Take 1 tablet (40 mg total) by mouth 2 (two) times daily. 01/07/15   [name], PA-C  isosorbide mononitrate (IMDUR) 30 MG 24 hr tablet Take 1 tablet (30 mg total) by mouth daily. 01/07/15   [name], PA-C  nitroGLYCERIN (NITROSTAT) 0.4 MG SL tablet Place 1 tablet (0.4 mg total) under the tongue every 5 (five) minutes as needed for chest pain. 01/06/15   [name], PA-C  sacubitril-valsartan (ENTRESTO) 24-26 MG Take 1 tablet by mouth 2 (two) times daily. 01/07/15   [name], PA-C  VENTOLIN HFA 108 (90 BASE) MCG/ACT inhaler Inhale 2 puffs into the lungs every 6 (six) hours as needed. Shortness of breath or wheezing 09/24/14   Historical Provider, MD   BP 187/80 mmHg  Pulse 59  Temp(Src) 98.3 F (36.8 C) (Oral)  Resp 20  SpO2 93% Physical Exam  Constitutional: He is oriented to person, place, and time. Vital signs are normal. He appears  well-developed and well-nourished.  Non-toxic appearance. He does not appear ill. No distress.  HENT:  Head: Normocephalic and atraumatic.  Nose: Nose normal.  Mouth/Throat: Oropharynx is clear and moist. No oropharyngeal exudate.  Eyes: Conjunctivae and EOM are normal. Pupils are equal, round, and reactive to light. No scleral icterus.  Neck: Normal range of motion. Neck supple. No tracheal deviation, no edema, no erythema and normal range of motion present. No thyroid mass and no thyromegaly present.  Cardiovascular:  Normal rate, regular rhythm, S1 normal, S2 normal, normal heart sounds, intact distal pulses and normal pulses.  Exam reveals no gallop and no friction rub.   No murmur heard. Pulses:      Radial pulses are 2+ on the right side, and 2+ on the left side.       Dorsalis pedis pulses are 2+ on the right side, and 2+ on the left side.  Pacemaker noted  Pulmonary/Chest: Effort normal and breath sounds normal. No respiratory distress. He has no wheezes. He has no rhonchi. He has no rales.  Abdominal: Soft. Normal appearance and bowel sounds are normal. He exhibits no distension, no ascites and no mass. There is no hepatosplenomegaly. There is no tenderness. There is no rebound, no guarding and no CVA tenderness. A hernia is present.  Mid-epigastric hernia, easily reducible   Musculoskeletal: Normal range of motion. He exhibits no edema or tenderness.  Lymphadenopathy:    He has no cervical adenopathy.  Neurological: He is alert and oriented to person, place, and time. He has normal strength. No cranial nerve deficit or sensory deficit.  Skin: Skin is warm, dry and intact. No petechiae and no rash noted. He is not diaphoretic. No erythema. No pallor.  Psychiatric: He has a normal mood and affect. His behavior is normal. Judgment normal.  Nursing note and vitals reviewed.   ED Course  Procedures   DIAGNOSTIC STUDIES: Oxygen Saturation is 93% on RA, adequate by my interpretation.     COORDINATION OF CARE: 11:20 PM Discussed treatment plan with pt at bedside and pt agreed to plan.   Labs Review Labs Reviewed  BASIC METABOLIC PANEL - Abnormal; Notable for the following:    Glucose, Bld 111 (*)    All other components within normal limits  BRAIN NATRIURETIC PEPTIDE - Abnormal; Notable for the following:    B Natriuretic Peptide 484.8 (*)    All other components within normal limits  CBC WITH DIFFERENTIAL/PLATELET  MAGNESIUM  I-STAT TROPOININ, ED    Imaging Review Dg Chest 2 View  01/28/2015   CLINICAL DATA:  Initial valuation for AICD firing.  EXAM: CHEST  2 VIEW  COMPARISON:  Prior radiograph from 01/04/2015  FINDINGS: Left-sided transvenous pacemaker/AICD is stable in appearance. Median sternotomy wires with underlying prostatic valve and surgical clips noted, unchanged. Cardiomegaly is unchanged. Mediastinal silhouette within normal limits.  Lungs are normally inflated. Blunting of the left costophrenic angles similar to previous, which may reflect tiny pleural effusion versus chronic pleural reaction/scarring. No focal infiltrate. No overt pulmonary edema. Mild diffuse prominence of the interstitial markings suggestive of mild diffuse interstitial edema.  Osseous structures are unchanged. Multiple remotely healed right-sided rib fractures noted, stable.  IMPRESSION: 1. Mild cardiomegaly with findings suggestive of mild diffuse interstitial edema. 2. Minimal blunting of the left costophrenic angle, which may reflect chronic pleural reaction/scarring versus tiny pleural effusion.   Electronically Signed   By: Rise Mu M.D.   On: 01/28/2015 00:12   I have personally reviewed and evaluated these images and lab results as part of my medical decision-making.   EKG Interpretation   Date/Time:  Sunday January 27 2015 22:52:42 EDT Ventricular Rate:  60 PR Interval:    QRS Duration: 162 QT Interval:  472 QTC Calculation: 472 R Axis:   -105 Text  Interpretation:  Ventricular-paced rhythm Abnormal ECG Confirmed by  Erroll Luna [id number]) on 01/27/2015 10:57:40 PM      MDM   Final diagnoses:  None  Patient presents to the ED for pacemaker problem.  He states it made a humming noise but did not shock him today.  Normally it delivers a shock after that.  He denies any changes to his health or medications.  Traci with Cardiology has evaluated the patient and the pacemaker has been interrogated.  Currently awaiting further recommendations.  Pacemaker interrogation reveals that patient had no arrhythmias and no shocks delivered. It does not atrial fibrillation which he is chronically in. ED workup was negative. Patient appears well in no acute distress. Has been no recurrence of the noise while in the emergency primary. He was advised to follow-up with his cardiologist for continued management. His vital signs were within his normal limits and he is safe for discharge.   I personally performed the services described in this documentation, which was scribed in my presence. The recorded information has been reviewed and is accurate.   Dominic Crumble, MD 01/28/15 [phone]

## 2015-01-27 NOTE — ED Notes (Addendum)
Interrogated Medtronic pacemaker.  

## 2015-01-27 NOTE — ED Notes (Signed)
Pt. Was sitting at home watching TV when he heard a humming sound and felt his Defib fire. Called EMS. On EMS arrival pt was paced by his pacemaker and having runs of PVCs. Pt. Has a ventricular and demand pacemaker. Pt has a history of pulmonary edema, ascites and hypertension. Pt is also non compliant with medications. Pt was sating at 91% on RA. EMS gave 4L Hart and pt is at 96%

## 2015-01-28 LAB — BASIC METABOLIC PANEL
ANION GAP: 8 (ref 5–15)
BUN: 11 mg/dL (ref 6–20)
CO2: 24 mmol/L (ref 22–32)
Calcium: 9.2 mg/dL (ref 8.9–10.3)
Chloride: 108 mmol/L (ref 101–111)
Creatinine, Ser: 0.99 mg/dL (ref 0.61–1.24)
GFR calc non Af Amer: >60 mL/min (ref >60)
GLUCOSE: 111 mg/dL — AB (ref 65–99)
POTASSIUM: 4.5 mmol/L (ref 3.5–5.1)
Sodium: 140 mmol/L (ref 135–145)

## 2015-01-28 LAB — MAGNESIUM: MAGNESIUM: 2 mg/dL (ref 1.7–2.4)

## 2015-01-28 LAB — I-STAT TROPONIN, ED: TROPONIN I, POC: 0.02 ng/mL (ref 0.00–0.08)

## 2015-01-28 LAB — BRAIN NATRIURETIC PEPTIDE: B Natriuretic Peptide: 484.8 pg/mL — ABNORMAL HIGH (ref 0.0–100.0)

## 2015-01-28 NOTE — ED Notes (Signed)
Pt. Left with all belongings and refused wheelchair. Discharge instructions were reviewed and all questions were answered.  

## 2015-01-28 NOTE — Discharge Instructions (Signed)
Pacemaker Implantation, Care After [name], your defibrillator did not fire today.  See your cardiologist within 3 days for repeat evaluation.  If any symptoms worsen, come back to the ED immediately.  Thank you. Refer to this sheet over the next few weeks. These instructions provide you with information on caring for yourself after the procedure. Your health care provider may also give you more specific instructions. Your treatment has been planned according to current medical practices, but problems sometimes occur. Call your health care provider if you have any problems or questions regarding your pacemaker.  WHAT TO EXPECT AFTER THE PROCEDURE  You may feel pain. Some pain is normal. It may last a few days.  A slight bump may be seen over the skin where the device was placed. Sometimes, it is possible to feel the device under the skin. This is normal.  In the months and years afterward, your health care provider will check the device, the leads, and the battery every few months. Eventually, when the battery is low, the device will be replaced. HOME CARE INSTRUCTIONS Medicines  Take medicines only as directed by your health care provider.  If you were prescribed an antibiotic medicine, finish it all even if you start to feel better.  Do not take any other medicines without asking your health care provider first. Some medicines, including certain painkillers, can cause bleeding in your stomach after surgery. Wound Care  Do not remove the bandage on your chest until directed to do so by your health care provider.  After your bandage is removed, you may see pieces of tape called skin adhesive strips over the area where the cut was made (incision site). Let them fall off on their own.  Check the incision site every day to make sure it is not infected, bleeding, or starting to pull apart.  Do not use lotions or ointments near the incision site unless directed to do so.  Keep the  incision area clean and dry for 2-3 days after the procedure or as directed by your health care provider. It takes several weeks for the incision site to completely heal.  Do not take baths, swim, or use a hot tub until your health care provider approves. Activities  Try to walk a little every day. Exercising is important after this procedure. It is also important to use your shoulder on the side of the pacemaker in daily tasks that do not require exaggerated motion.  Avoid sudden jerking, pulling, or chopping movements that pull your upper arm far away from your body for at least 6 weeks.  Do not lift your upper arm above your shoulders for at least 6 weeks. This means no tennis, golf, or swimming for this period of time. If you sleep with the arm above your head, use a restraint to prevent this from happening as you sleep.  You may go back to work when your health care provider says it is okay. Check with your health care provider before you start to drive or play sports. Other Instructions  Follow diet instructions if they were provided. You should be able to eat what you usually do right away, but you may need to limit your salt intake.  Weigh yourself every day. If you suddenly gain weight, fluid may be building up in your body.  Always carry your pacemaker identification card with you. The card should list the implant date, device model, and manufacturer. Consider wearing a medical alert bracelet or necklace.  Tell all health care providers that you have a pacemaker. This may prevent them from giving you a magnetic resource imaging scan (MRI) because of the strong magnets used during that test.  If you must pass through a metal detector, quickly walk through it. Do not stop under the detector or stand near it.  Avoid places or objects with a strong electric or magnetic field, including:  Scientist, physiological. When at the airport, let officials know you have a pacemaker. Your ID card  will let you be checked in a way that is safe for you and that will not damage your pacemaker. Also, do not let a security person wave a magnetic wand near your pacemaker. That can make it stop working.  Power plants.  Large electrical generators.  Radiofrequency transmission towers, such as cell phone and radio towers.  Do not use amateur (ham) radio equipment or electric (arc) welding torches. Some devices are safe to use if held at least 1 foot from your pacemaker. These include power tools, lawn mowers, and speakers. If you are unsure of whether something is safe to use, ask your health care provider.  You may safely use electric blankets, heating pads, computers, and microwave ovens.  When using your cell phone, hold it to the ear opposite the pacemaker. Do not leave your cell phone in a pocket over the pacemaker.  Keep all follow-up visits as directed by your health care provider. This is how your health care provider makes sure your chest is healing the way it should. Ask your health care provider when you should come back to have your stitches or staples taken out.  Have your pacemaker checked every 3-6 months or as directed by your health care provider. Most pacemakers last for 4-8 years before a new one is needed. SEEK MEDICAL CARE IF:  You gain weight suddenly.  Your legs or feet swell more than they have before.  It feels like your heart is fluttering or skipping beats (heart palpitations).  You have a fever. SEEK IMMEDIATE MEDICAL CARE IF:  You have chest pain.  You feel more short of breath than you have felt before.  You feel more light-headed than you have felt before.  You have problems with your incision site, such as swelling or bleeding, or it starts to open up.  You have drainage, redness, swelling, or pain at your incision site. Document Released: 12/12/2004 Document Revised: 10/09/2013 Document Reviewed: 09/25/2011 [facility] Patient Information 2015  [place], [place]. This information is not intended to replace advice given to you by your health care provider. Make sure you discuss any questions you have with your health care provider.  Cardioverter Defibrillator Implantation, Care After Refer to this sheet in the next few weeks. These instructions provide you with information on caring for yourself after your procedure. Your health care provider may also give you more specific instructions. Your treatment has been planned according to current medical practices, but problems sometimes occur. Call your health care provider if you have any problems or questions after your procedure.  WHAT TO EXPECT AFTER THE PROCEDURE  You may feel pain. Some pain is normal. It may last a few days.  A slight bump may be seen over the skin where the device was placed. Sometimes, it is possible to feel the device under the skin. This is normal.  In the months and years afterward, your health care provider will check the device, the leads, and the battery every few months. Eventually,  when the battery is low, the device will be replaced. HOME CARE INSTRUCTIONS  Medicines  Take medicines only as directed by your health care provider.  If you were prescribed an antibiotic medicine, finish it all even if you start to feel better.   Do not take any other medicines without asking your health care provider first. Some medicines, including certain painkillers, can cause bleeding after surgery.  Wound Care  Do not remove the bandage on your chest until directed to do so by your health care provider.  Once your bandage is removed, you may see pieces of tape called skin adhesive strips over the area where the cut was made (incision site). Let them fall off on their own.   Check the incision site every day to make sure it is not infected, bleeding, or starting to pull apart.  Do not use lotions or ointments near the incision site unless directed to do so.   Keep  the incision area clean and dry for 2-3 days after the procedure or as directed by your health care provider. It takes several weeks for the incision site to completely heal.   Do not take baths, swim, or use a hot tub until your health care provider approves. Activities  Try to walk a little every day. Exercising is important after this procedure. It is also important to use your shoulder on the side of the pacemaker in daily tasks that do not require exaggerated motion.  Avoid sudden jerking, pulling, or chopping movements that pull your upper arm far away from your body for at least 6 weeks.  Do not lift your upper arm above your shoulders for at least 6 weeks. This means no tennis, golf, or swimming for this period of time. If you sleep with the arm above your head, use a restraint to prevent this from happening as you sleep.  You may go back to work when your health care provider says it is okay. Check with your health care provider before you start to drive or play sports.  Other Instructions  Follow diet instructions if they were provided. You should be able to eat what you usually do right away, but you may need to limit your salt intake.   Weigh yourself every day. If you suddenly gain weight, fluid may be building up in your body.   Always carry your pacemaker identification card with you. The card should list the implant date, device model, and manufacturer. Consider wearing a medical alert bracelet or necklace.  Tell all health care providers that you have a pacemaker. This may prevent them from giving you a magnetic resonance imaging (MRI) scan because of the strong magnets used during that test.  If you must pass through a metal detector, quickly walk through it. Do not stop under the detector or stand near it.  Avoid places or objects with a strong electric or magnetic field, including:   Scientist, physiological. When at the airport, let officials know you have a  pacemaker. Your ID card will let you be checked in a way that is safe for you and that will not damage your pacemaker. Also, do not let a security person wave a magnetic wand near your pacemaker. That can make it stop working.  Power plants.   Large electrical generators.   Radiofrequency transmission towers, such as cell phone and radio towers.   Do not use amateur (ham) radio equipment or electric (arc) welding torches. Some devices are safe to  use if held at least 1 foot from your pacemaker. These include power tools, lawn mowers, and speakers. If you are unsure of whether something is safe to use, ask your health care provider.   You may safely use electric blankets, heating pads, computers, and microwave ovens.   When using your cell phone, hold it to the ear opposite the pacemaker. Do not leave your cell phone in a pocket over the pacemaker.   Keep all follow-up visits as directed by your health care provider. This is how your health care provider makes sure your chest is healing the way it should. Ask your health care provider when you should come back to have your stitches or staples taken out.   Have your pacemaker checked every 3-6 months or as directed by your health care provider. Most pacemakers last for 4-8 years before a new one is needed. SEEK MEDICAL CARE IF:   You feel one shock in your chest.  You gain weight suddenly.   Your legs or feet swell more than they have before.   It feels like your heart is fluttering or skipping beats (heart palpitations).  You have a fever. SEEK IMMEDIATE MEDICAL CARE IF:   You have chest pain.  You feel more than one shock.  You feel more short of breath than you have felt before.  You feel more light-headed than you have felt before.  You have problems with your incision site, such as swelling or bleeding, or it starts to open up.   You notice signs of infection around your incision site. Watch for:   Warmth.    Redness.   Worsening pain.   Swelling.   Fluid leaking from the incision site.  Document Released: 12/12/2004 Document Revised: 10/09/2013 Document Reviewed: 06/15/2012 [facility] Patient Information 2015 [place], [place]. This information is not intended to replace advice given to you by your health care provider. Make sure you discuss any questions you have with your health care provider.

## 2015-04-03 ENCOUNTER — Emergency Department (HOSPITAL_COMMUNITY): Payer: Commercial Managed Care - HMO

## 2015-04-03 ENCOUNTER — Emergency Department (HOSPITAL_COMMUNITY)
Admission: EM | Admit: 2015-04-03 | Discharge: 2015-04-03 | Disposition: A | Discharge status: Home / Self Care | Payer: Commercial Managed Care - HMO | Attending: Emergency Medicine | Admitting: Emergency Medicine

## 2015-04-03 ENCOUNTER — Encounter (HOSPITAL_COMMUNITY): Payer: Self-pay | Admitting: *Deleted

## 2015-04-03 DIAGNOSIS — Z8673 Personal history of transient ischemic attack (TIA), and cerebral infarction without residual deficits: Secondary | ICD-10-CM | POA: Insufficient documentation

## 2015-04-03 DIAGNOSIS — I2510 Atherosclerotic heart disease of native coronary artery without angina pectoris: Secondary | ICD-10-CM | POA: Insufficient documentation

## 2015-04-03 DIAGNOSIS — J449 Chronic obstructive pulmonary disease, unspecified: Secondary | ICD-10-CM | POA: Diagnosis not present

## 2015-04-03 DIAGNOSIS — I5022 Chronic systolic (congestive) heart failure: Secondary | ICD-10-CM

## 2015-04-03 DIAGNOSIS — Z86718 Personal history of other venous thrombosis and embolism: Secondary | ICD-10-CM | POA: Insufficient documentation

## 2015-04-03 DIAGNOSIS — F419 Anxiety disorder, unspecified: Secondary | ICD-10-CM | POA: Diagnosis not present

## 2015-04-03 DIAGNOSIS — Z79899 Other long term (current) drug therapy: Secondary | ICD-10-CM | POA: Insufficient documentation

## 2015-04-03 DIAGNOSIS — F319 Bipolar disorder, unspecified: Secondary | ICD-10-CM | POA: Diagnosis not present

## 2015-04-03 DIAGNOSIS — J029 Acute pharyngitis, unspecified: Secondary | ICD-10-CM | POA: Diagnosis not present

## 2015-04-03 DIAGNOSIS — M545 Low back pain, unspecified: Secondary | ICD-10-CM

## 2015-04-03 DIAGNOSIS — I482 Chronic atrial fibrillation: Secondary | ICD-10-CM | POA: Diagnosis not present

## 2015-04-03 DIAGNOSIS — E785 Hyperlipidemia, unspecified: Secondary | ICD-10-CM | POA: Diagnosis not present

## 2015-04-03 DIAGNOSIS — Z720 Tobacco use: Secondary | ICD-10-CM | POA: Diagnosis not present

## 2015-04-03 DIAGNOSIS — R0902 Hypoxemia: Secondary | ICD-10-CM | POA: Diagnosis not present

## 2015-04-03 DIAGNOSIS — I10 Essential (primary) hypertension: Secondary | ICD-10-CM | POA: Diagnosis not present

## 2015-04-03 LAB — BASIC METABOLIC PANEL
Anion gap: 11 (ref 5–15)
BUN: 17 mg/dL (ref 6–20)
CALCIUM: 9.1 mg/dL (ref 8.9–10.3)
CO2: 21 mmol/L — ABNORMAL LOW (ref 22–32)
CREATININE: 1.2 mg/dL (ref 0.61–1.24)
Chloride: 104 mmol/L (ref 101–111)
GFR calc non Af Amer: >60 mL/min (ref >60)
Glucose, Bld: 139 mg/dL — ABNORMAL HIGH (ref 65–99)
Potassium: 4.2 mmol/L (ref 3.5–5.1)
SODIUM: 136 mmol/L (ref 135–145)

## 2015-04-03 LAB — CBC
HCT: 43.4 % (ref 39.0–52.0)
Hemoglobin: 13.7 g/dL (ref 13.0–17.0)
MCH: 28.5 pg (ref 26.0–34.0)
MCHC: 31.6 g/dL (ref 30.0–36.0)
MCV: 90.2 fL (ref 78.0–100.0)
PLATELETS: 200 10*3/uL (ref 150–400)
RBC: 4.81 MIL/uL (ref 4.22–5.81)
RDW: 15.7 % — AB (ref 11.5–15.5)
WBC: 9.8 10*3/uL (ref 4.0–10.5)

## 2015-04-03 LAB — I-STAT TROPONIN, ED: TROPONIN I, POC: 0.05 ng/mL (ref 0.00–0.08)

## 2015-04-03 LAB — RAPID STREP SCREEN (MED CTR MEBANE ONLY): STREPTOCOCCUS, GROUP A SCREEN (DIRECT): NEGATIVE

## 2015-04-03 MED ORDER — DEXAMETHASONE 4 MG PO TABS
10.0000 mg | ORAL_TABLET | Freq: Once | ORAL | Status: AC
  Administered 2015-04-03: 10 mg via ORAL
  Filled 2015-04-03: qty 3

## 2015-04-03 MED ORDER — SACUBITRIL-VALSARTAN 24-26 MG PO TABS
1.0000 | ORAL_TABLET | Freq: Two times a day (BID) | ORAL | Status: DC
  Administered 2015-04-03: 1 via ORAL
  Filled 2015-04-03 (×2): qty 1

## 2015-04-03 NOTE — ED Notes (Signed)
Pt is going to call cab to come get him. Reports he can pay for it. There is nobody he can call for a ride.

## 2015-04-03 NOTE — Discharge Instructions (Signed)
Continue your current medications as prescribed.   Back Pain, Adult Back pain is very common in adults.The cause of back pain is rarely dangerous and the pain often gets better over time.The cause of your back pain may not be known. Some common causes of back pain include:  Strain of the muscles or ligaments supporting the spine.  Wear and tear (degeneration) of the spinal disks.  Arthritis.  Direct injury to the back. For many people, back pain may return. Since back pain is rarely dangerous, most people can learn to manage this condition on their own. HOME CARE INSTRUCTIONS Watch your back pain for any changes. The following actions may help to lessen any discomfort you are feeling:  Remain active. It is stressful on your back to sit or stand in one place for long periods of time. Do not sit, drive, or stand in one place for more than 30 minutes at a time. Take short walks on even surfaces as soon as you are able.Try to increase the length of time you walk each day.  Exercise regularly as directed by your health care provider. Exercise helps your back heal faster. It also helps avoid future injury by keeping your muscles strong and flexible.  Do not stay in bed.Resting more than 1-2 days can delay your recovery.  Pay attention to your body when you bend and lift. The most comfortable positions are those that put less stress on your recovering back. Always use proper lifting techniques, including:  Bending your knees.  Keeping the load close to your body.  Avoiding twisting.  Find a comfortable position to sleep. Use a firm mattress and lie on your side with your knees slightly bent. If you lie on your back, put a pillow under your knees.  Avoid feeling anxious or stressed.Stress increases muscle tension and can worsen back pain.It is important to recognize when you are anxious or stressed and learn ways to manage it, such as with exercise.  Take medicines only as directed by  your health care provider. Over-the-counter medicines to reduce pain and inflammation are often the most helpful.Your health care provider may prescribe muscle relaxant drugs.These medicines help dull your pain so you can more quickly return to your normal activities and healthy exercise.  Apply ice to the injured area:  Put ice in a plastic bag.  Place a towel between your skin and the bag.  Leave the ice on for 20 minutes, 2-3 times a day for the first 2-3 days. After that, ice and heat may be alternated to reduce pain and spasms.  Maintain a healthy weight. Excess weight puts extra stress on your back and makes it difficult to maintain good posture. SEEK MEDICAL CARE IF:  You have pain that is not relieved with rest or medicine.  You have increasing pain going down into the legs or buttocks.  You have pain that does not improve in one week.  You have night pain.  You lose weight.  You have a fever or chills. SEEK IMMEDIATE MEDICAL CARE IF:   You develop new bowel or bladder control problems.  You have unusual weakness or numbness in your arms or legs.  You develop nausea or vomiting.  You develop abdominal pain.  You feel faint.   This information is not intended to replace advice given to you by your health care provider. Make sure you discuss any questions you have with your health care provider.   Document Released: 05/25/2005 Document Revised: 06/15/2014 Document  Reviewed: 09/26/2013 Elsevier Interactive Patient Education [employer].

## 2015-04-03 NOTE — ED Notes (Signed)
Pt to ED via EMS c/o back pain and sore throat. Per EMS, pt was watching a commercial on TV about medication for back pain when he felt pain in lower back. Pt also c/o throat pain and requesting to talk to "the heart doctor about this pacemaker, sometimes it makes a noise ." Pt denies chest pain, reports intermittent shortness of breath. Oxygen sats noted to be 86% on room air

## 2015-04-03 NOTE — ED Provider Notes (Signed)
CSN: [id number]     Arrival date & time 04/03/15  0544 History   First MD Initiated Contact with Patient 04/03/15 [phone]     Chief Complaint  Patient presents with  . Back Pain  . Sore Throat     (Consider location/radiation/quality/duration/timing/severity/associated sxs/prior Treatment) Patient is a 69 y.o. male presenting with back pain and pharyngitis. The history is provided by the patient.  Back Pain Sore Throat  He complains of sore throat this morning and low back pain for about a week. He states the pain is worse with swallowing and back pain is worse with movement. He denies any trauma or unusual activity. Denies fever or chills. He denies coughing. Denies chest pain, heaviness, tightness, pressure. He denies dyspnea, nausea, vomiting. He rates pain at 6/10. He has taken acetaminophen for pain without relief.  Past Medical History  Diagnosis Date  . DVT (deep venous thrombosis) (HCC)   . Mitral valve disease     a. remote mitral replacement with Bjork Shiley valve;  b. 06/2006 Redo MVR with tissue valve.  Marland Kitchen COPD (chronic obstructive pulmonary disease) (HCC)   . Depression   . Hypertension   . Dyslipidemia   . Ventral hernia   . CAD (coronary artery disease)     a. s/p prior PCI/stenting of the LCX;  b. 08/2014 MV: EF 20%, large septal, apical, and inferior infarct from apex to base, no ischemia;  c. 08/2014 NSTEMI/Cath: LM mod distal dzs extending into ostial LCX (80%), LAD tortuous, RI nl, OM mod dzs, RCA 100 CTO with R->R and L->R collats-->Med Rx.  . Mixed Ischemic and Nonischemic Cardiomyopathy     a. 8/.2015 Echo: Ef 25-30%;  b. 01/2014 s/p MDT [id number] Talbot Grumbling XT CRT-D (ser # [id number]).  . Chronic systolic congestive heart failure (HCC)     a. 01/2014 Echo: EF 25-30%, mod conc LVH, mod dil LA.  . Bipolar disorder (HCC)   . Anxiety   . Persistent atrial fibrillation (HCC)     a. CHA2DS2VASc = 6->eliquis;  b. S/P AVN RFCA an BiV ICD placement.  . Alcohol abuse   . Complete  heart block (HCC)     a. In setting of prior AV nodal ablation r/t afib-->BiV ICD (01/2014).  . Stroke (HCC)   . CVA (cerebral vascular accident) (HCC)     a. Multiple prior embolic strokes.  . Biventricular automatic implantable cardioverter defibrillator in situ     a. 01/2014 s/p MDT [id number] Viva XT CRT-D (ser # [id number]).   Past Surgical History  Procedure Laterality Date  . Cystoscopy w/ ureteral stent placement  07/12/2011    Procedure: CYSTOSCOPY WITH RETROGRADE PYELOGRAM/URETERAL STENT PLACEMENT;  Surgeon: Sebastian Ache, MD;  Location: [facility] OR;  Service: Urology;  Laterality: Left;  . Hernia repair    . Mitral valve replacement      remote [facility] valve 8469 with redo tissue valve 06/2006  . Pacemaker placement  2006    Changed to CRT-D in 2009  . Av node ablation  2007  . Icd generator change  02/01/2014    Gen change to: Medtronic VIVA pulse generator, serial number [id number]  . Cardiac catheterization  06/2006    Mild ostial L main stenosis, CFX stent patent, RCA occluded (old)  . Coronary artery bypass graft  2008  . Pacemaker generator change N/A 02/01/2014    Procedure: PACEMAKER GENERATOR CHANGE;  Surgeon: Duke Salvia, MD;  Location: [facility] CATH LAB;  Service: Cardiovascular;  Laterality: N/A;  .  Left heart catheterization with coronary angiogram N/A 08/23/2014    Procedure: LEFT HEART CATHETERIZATION WITH CORONARY ANGIOGRAM;  Surgeon: Corky Crafts, MD;  Location: [facility] C[facility] LAB;  Service: Cardiovascular;  Laterality: N/A;   Family History  Problem Relation Age of Onset  . Stroke    . Heart disease    . Alzheimer's disease Mother   . Heart attack Father   . Heart attack Son   . Varicose Veins Son   . Deep vein thrombosis Son    Social History  Substance Use Topics  . Smoking status: Current Every Day Smoker -- 2.00 packs/day    Types: Cigarettes    Last Attempt to Quit: 01/30/2003  . Smokeless tobacco: Never Used  . Alcohol Use: 18.0 oz/week    24 Cans of  beer, 6 Standard drinks or equivalent per week     Comment: occasionally    Review of Systems  Musculoskeletal: Positive for back pain.  All other systems reviewed and are negative.     Allergies  Warfarin  Home Medications   Prior to Admission medications   Medication Sig Start Date End Date Taking? Authorizing Provider  apixaban (ELIQUIS) 5 MG TABS tablet Take 5 mg by mouth 2 (two) times daily.   Yes Historical Provider, MD  atorvastatin (LIPITOR) 20 MG tablet TAKE 1 TABLET BY MOUTH EVERY DAY 05/31/14  Yes [name], MD  fenofibrate (TRICOR) 48 MG tablet Take 1 tablet (48 mg total) by mouth daily. 10/22/14  Yes [name], MD  furosemide (LASIX) 40 MG tablet Take 1 tablet (40 mg total) by mouth 2 (two) times daily. 01/07/15  Yes [name], PA-C  nitroGLYCERIN (NITROSTAT) 0.4 MG SL tablet Place 1 tablet (0.4 mg total) under the tongue every 5 (five) minutes as needed for chest pain. 01/06/15  Yes [name], PA-C  sacubitril-valsartan (ENTRESTO) 24-26 MG Take 1 tablet by mouth 2 (two) times daily. 01/07/15  Yes [name], PA-C  VENTOLIN HFA 108 (90 BASE) MCG/ACT inhaler Inhale 2 puffs into the lungs every 6 (six) hours as needed. Shortness of breath or wheezing 09/24/14  Yes Historical Provider, MD  isosorbide mononitrate (IMDUR) 30 MG 24 hr tablet Take 1 tablet (30 mg total) by mouth daily. Patient not taking: Reported on 04/03/2015 01/07/15   [name], PA-C   BP 167/101 mmHg  Pulse 100  Temp(Src) 97.6 F (36.4 C)  Resp 38  Ht 5\' 7"  (1.702 m)  Wt 145 lb (65.772 kg)  BMI 22.71 kg/m2  SpO2 98% Physical Exam  Nursing note and vitals reviewed.  69 year old male, resting comfortably and in no acute distress. Vital signs are significant for hypertension and tachypnea. Oxygen saturation is 86%, which is hypoxic. He was placed on nasal oxygen and saturation came up to 98% which is normal. Head is normocephalic and atraumatic. PERRLA,  EOMI. Oropharynx is mildly erythematous without exudate. He has no difficulty with secretions and phonation is normal. Neck is nontender and supple without adenopathy or JVD. Back is mildly to moderately tender in the mid and lower lumbar area. There is no CVA tenderness. Straight leg raise is negative. Lungs are clear without rales, wheezes, or rhonchi. Chest is nontender. Heart has regular rate and rhythm without murmur. Abdomen is soft, flat, nontender without masses or hepatosplenomegaly and peristalsis is normoactive. Extremities have no cyanosis or edema, full range of motion is present. Skin is warm and dry without rash. Neurologic: Mental status is  normal, cranial nerves are intact, there are no motor or sensory deficits.  ED Course  Procedures (including critical care time) Labs Review Results for orders placed or performed during the hospital encounter of 04/03/15  Rapid strep screen (not at [facility])  Result Value Ref Range   Streptococcus, Group A Screen (Direct) NEGATIVE NEGATIVE  Basic metabolic panel  Result Value Ref Range   Sodium 136 135 - 145 mmol/L   Potassium 4.2 3.5 - 5.1 mmol/L   Chloride 104 101 - 111 mmol/L   CO2 21 (L) 22 - 32 mmol/L   Glucose, Bld 139 (H) 65 - 99 mg/dL   BUN 17 6 - 20 mg/dL   Creatinine, Ser 2.95 0.61 - 1.24 mg/dL   Calcium 9.1 8.9 - 62.1 mg/dL   GFR calc non Af Amer >60 >60 mL/min   GFR calc Af Amer >60 >60 mL/min   Anion gap 11 5 - 15  CBC  Result Value Ref Range   WBC 9.8 4.0 - 10.5 K/uL   RBC 4.81 4.22 - 5.81 MIL/uL   Hemoglobin 13.7 13.0 - 17.0 g/dL   HCT 30.8 65.7 - 84.6 %   MCV 90.2 78.0 - 100.0 fL   MCH 28.5 26.0 - 34.0 pg   MCHC 31.6 30.0 - 36.0 g/dL   RDW 96.2 (H) 95.2 - 84.1 %   Platelets 200 150 - 400 K/uL  I-stat troponin, ED  Result Value Ref Range   Troponin i, poc 0.05 0.00 - 0.08 ng/mL   Comment 3            Imaging Review Dg Chest 2 View  04/03/2015  CLINICAL DATA:  Sore throat and shortness of breath today.  EXAM: CHEST  2 VIEW COMPARISON:  Chest radiograph January 27, 2015 FINDINGS: The cardiac silhouette is moderately enlarged unchanged. Status post median sternotomy, cardiac valve replacement. 3 lead cardiac defibrillator in situ. Multiple pacer wires in LEFT chest. Similar chronic bronchitic changes, no pleural effusion. LEFT lung base calcified pleural plaque with pleural thickening. Apical pleural thickening. No pneumothorax. Old RIGHT rib fractures. Soft tissue planes and included osseous structure nonsuspicious. IMPRESSION: Stable cardiomegaly and chronic bronchitic changes. Electronically Signed   By: Awilda Metro M.D.   On: 04/03/2015 06:20   Dg Lumbar Spine Complete  04/03/2015  CLINICAL DATA:  Chronic low back pain for months without known injury. EXAM: LUMBAR SPINE - COMPLETE 4+ VIEW COMPARISON:  CT scan of November 08, 2012. FINDINGS: Mild wedge compression deformity of L5 is noted which is unchanged compared to prior exam consistent with old fracture. No acute fracture or spondylolisthesis is noted. Mild degenerative changes seen involving posterior facet joints of L5-S1. Atherosclerosis of abdominal aorta is noted. Disc spaces appear well maintained. IMPRESSION: No acute abnormality seen in the lumbar spine. Electronically Signed   By: Lupita Raider, M.D.   On: 04/03/2015 07:32   I have personally reviewed and evaluated these images and lab results as part of my medical decision-making.   EKG Interpretation   Date/Time:  Wednesday April 03 2015 05:52:42 EDT Ventricular Rate:  99 PR Interval:  106 QRS Duration: 150 QT Interval:  405 QTC Calculation: 520 R Axis:   -119 Text Interpretation:  VENTRICULAR PACED RHYTHM Baseline wander in lead(s)  V2 When compared with ECG of 01/27/2015, No significant change was found  Confirmed by [facility]  MD, Jarek Longton ([id number]) on 04/03/2015 6:00:20 AM      MDM   Final diagnoses:  Midline low back  pain without sciatica  Sore throat    Pharyngitis  which appears most likely be viral. Strep screen is obtained. Low back pain of uncertain cause. And no concerning findings on physical exam. Initial hypoxia is noted but patient is resting comfortably and in no distress. He will be taken off of nasal oxygen is seen ED saturates. I have reviewed his old records and he has had for CT scan of chest, abdomen, pelvis in 2014 showing no evidence of aortic aneurysm. He will be sent for plain x-rays of lumbar spine.  Strep screen is negative and lumbar spine films are unremarkable-no evidence of compression deformity. Patient was maintaining adequate oxygenation and was taken off of oxygen and observed. While awake, he maintained adequate oxygenation but would drop down to 85% when he drifts off to sleep. He is in no distress whatsoever with this. It is noted that he is followed in the heart failure clinic. Cardiology has been consulted to assist in decision whether he needs to be admitted for hypoxia or whether he needs to have home oxygen set up or whether desaturation to 85% is acceptable for this patient.   Dione Booze, MD 04/03/15 [phone]

## 2015-04-03 NOTE — Consult Note (Signed)
Primary Physician: Primary Cardiologist:  Nobie Putnam   HPI: [name] is a 69 yo who has a history of CAD and chronic systolic CHF He is s/p BiV ICD.  Hx of MV dz, s/p MV replacement x 2.  Also a history persistent atrial fib.  S/p AVN ablation    On Eliquis and not ASA   Last cath in March   Mod distal LM/Ostial LCx  High risk for PCI ; Poor CABG candidated due to low EF.  C  Patient presents to ER wit sore throat and back pain. Throat pain worse with swallowing.   No CP  Oxygen noted in ER to drop to 85% when sleepin  OK when awake.    He denies SOB  No CP  No dizzines No orthopnea       Past Medical History  Diagnosis Date  . DVT (deep venous thrombosis) (HCC)   . Mitral valve disease     a. remote mitral replacement with Bjork Shiley valve;  b. 06/2006 Redo MVR with tissue valve.  Marland Kitchen COPD (chronic obstructive pulmonary disease) (HCC)   . Depression   . Hypertension   . Dyslipidemia   . Ventral hernia   . CAD (coronary artery disease)     a. s/p prior PCI/stenting of the LCX;  b. 08/2014 MV: EF 20%, large septal, apical, and inferior infarct from apex to base, no ischemia;  c. 08/2014 NSTEMI/Cath: LM mod distal dzs extending into ostial LCX (80%), LAD tortuous, RI nl, OM mod dzs, RCA 100 CTO with R->R and L->R collats-->Med Rx.  . Mixed Ischemic and Nonischemic Cardiomyopathy     a. 8/.2015 Echo: Ef 25-30%;  b. 01/2014 s/p MDT [id number] Talbot Grumbling XT CRT-D (ser # [id number]).  . Chronic systolic congestive heart failure (HCC)     a. 01/2014 Echo: EF 25-30%, mod conc LVH, mod dil LA.  . Bipolar disorder (HCC)   . Anxiety   . Persistent atrial fibrillation (HCC)     a. CHA2DS2VASc = 6->eliquis;  b. S/P AVN RFCA an BiV ICD placement.  . Alcohol abuse   . Complete heart block (HCC)     a. In setting of prior AV nodal ablation r/t afib-->BiV ICD (01/2014).  . Stroke (HCC)   . CVA (cerebral vascular accident) (HCC)     a. Multiple prior embolic strokes.  . Biventricular automatic  implantable cardioverter defibrillator in situ     a. 01/2014 s/p MDT [id number] Viva XT CRT-D (ser # [id number]).     (Not in a hospital admission)     Infusions:   Allergies  Allergen Reactions  . Warfarin Other (See Comments)    Non compliance and ETOH abuse    Social History   Social History  . Marital Status: Divorced    Spouse Name: N/A  . Number of Children: N/A  . Years of Education: N/A   Occupational History  . Retired Therapist, music    Social History Main Topics  . Smoking status: Current Every Day Smoker -- 2.00 packs/day    Types: Cigarettes    Last Attempt to Quit: 01/30/2003  . Smokeless tobacco: Never Used  . Alcohol Use: 18.0 oz/week    24 Cans of beer, 6 Standard drinks or equivalent per week     Comment: occasionally  . Drug Use: No  . Sexual Activity: Not Currently   Other Topics Concern  . Not on file   Social History Narrative   ** Merged History Encounter **  Family History  Problem Relation Age of Onset  . Stroke    . Heart disease    . Alzheimer's disease Mother   . Heart attack Father   . Heart attack Son   . Varicose Veins Son   . Deep vein thrombosis Son     REVIEW OF SYSTEMS:  All systems reviewed  Negative to the above problem except as noted above.    PHYSICAL EXAM: Filed Vitals:   04/03/15 0732  BP:   Pulse:   Temp: 97.6 F (36.4 C)  Resp:     No intake or output data in the 24 hours ending 04/03/15 0811  General:  Well appearing. No respiratory difficulty HEENT: normal  No  Lymphadenopathy Throat  No erythema   Neck: supple. no JVD. Carotids 2+ bilat; no bruits. No lymphadenopathy or thryomegaly appreciated. Cor: PMI nondisplaced. Regular rate & rhythm. No rubs, gallops or murmurs. Lungs: clear Abdomen: soft, nontender, nondistended. No hepatosplenomegaly. No bruits or masses. Good bowel sounds. Extremities: no cyanosis, clubbing, rash, edema Neuro: alert & oriented x 3, cranial nerves grossly intact. moves  all 4 extremities w/o difficulty. Affect pleasant.  ECG:  Results for orders placed or performed during the hospital encounter of 04/03/15 (from the past 24 hour(s))  Basic metabolic panel     Status: Abnormal   Collection Time: 04/03/15  6:00 AM  Result Value Ref Range   Sodium 136 135 - 145 mmol/L   Potassium 4.2 3.5 - 5.1 mmol/L   Chloride 104 101 - 111 mmol/L   CO2 21 (L) 22 - 32 mmol/L   Glucose, Bld 139 (H) 65 - 99 mg/dL   BUN 17 6 - 20 mg/dL   Creatinine, Ser 7.42 0.61 - 1.24 mg/dL   Calcium 9.1 8.9 - 59.5 mg/dL   GFR calc non Af Amer >60 >60 mL/min   GFR calc Af Amer >60 >60 mL/min   Anion gap 11 5 - 15  CBC     Status: Abnormal   Collection Time: 04/03/15  6:00 AM  Result Value Ref Range   WBC 9.8 4.0 - 10.5 K/uL   RBC 4.81 4.22 - 5.81 MIL/uL   Hemoglobin 13.7 13.0 - 17.0 g/dL   HCT 63.8 75.6 - 43.3 %   MCV 90.2 78.0 - 100.0 fL   MCH 28.5 26.0 - 34.0 pg   MCHC 31.6 30.0 - 36.0 g/dL   RDW 29.5 (H) 18.8 - 41.6 %   Platelets 200 150 - 400 K/uL  I-stat troponin, ED     Status: None   Collection Time: 04/03/15  6:06 AM  Result Value Ref Range   Troponin i, poc 0.05 0.00 - 0.08 ng/mL   Comment 3          Rapid strep screen (not at [facility])     Status: None   Collection Time: 04/03/15  7:30 AM  Result Value Ref Range   Streptococcus, Group A Screen (Direct) NEGATIVE NEGATIVE   Dg Chest 2 View  04/03/2015  CLINICAL DATA:  Sore throat and shortness of breath today. EXAM: CHEST  2 VIEW COMPARISON:  Chest radiograph January 27, 2015 FINDINGS: The cardiac silhouette is moderately enlarged unchanged. Status post median sternotomy, cardiac valve replacement. 3 lead cardiac defibrillator in situ. Multiple pacer wires in LEFT chest. Similar chronic bronchitic changes, no pleural effusion. LEFT lung base calcified pleural plaque with pleural thickening. Apical pleural thickening. No pneumothorax. Old RIGHT rib fractures. Soft tissue planes and included osseous structure nonsuspicious.  IMPRESSION: Stable cardiomegaly and chronic bronchitic changes. Electronically Signed   By: Awilda Metro M.D.   On: 04/03/2015 06:20   Dg Lumbar Spine Complete  04/03/2015  CLINICAL DATA:  Chronic low back pain for months without known injury. EXAM: LUMBAR SPINE - COMPLETE 4+ VIEW COMPARISON:  CT scan of November 08, 2012. FINDINGS: Mild wedge compression deformity of L5 is noted which is unchanged compared to prior exam consistent with old fracture. No acute fracture or spondylolisthesis is noted. Mild degenerative changes seen involving posterior facet joints of L5-S1. Atherosclerosis of abdominal aorta is noted. Disc spaces appear well maintained. IMPRESSION: No acute abnormality seen in the lumbar spine. Electronically Signed   By: Lupita Raider, M.D.   On: 04/03/2015 07:32     ASSESSMENT: 1  Resp  Patients oxygen saturation is 95 to 98% on 2L oxygen while lying flat.  Noted to go into 80s with sleeping  Exam without evid of volume overload  May represent some sleep apnea  Will notify CHF service  May consider outpt evaluation.with sleep study  2  HTN is significantly elevated  Not clear when took meds  WOuld give Entresto this AM WIll make sure that he has outpt appt.    3  CAD  No symtpoms to sugg angina.  Throat discomfort does not appear to be anginal equivalent    4.  Chronic systolic CHF  Volume status OK  WIll arrange f/u with CHF clinic    5.  Afib  S/p AVN ablation  Continue Eliquis    6.  Tob abuse    I have made appt with CHF clinic for BP reeval on Friday Oct 28 at 2 PM with Dr Shirlee Latch F/U of BP and consideration for sleep eval.   Dietrich Pates

## 2015-04-03 NOTE — ED Notes (Signed)
Pt returned from xray; repeat ekg given to [facility] with frequent PVC

## 2015-04-03 NOTE — ED Provider Notes (Signed)
Pt visit shared with Dr. Preston Fleeting.  Pt here for sore throat, low back pain - both resolved on my evaluation.  Cardiology was consulted because patient desats to 80s while sleeping.  No respiratory distress/SOB/chest pain while awake.  He has been seen by Dr. Tenny Craw with Cardiology - he plans to go home with outpatient Cardiology follow up on Friday.  Providing a dose of his morning BP med prior to d/c.  Pt in agreement with plan.   Tilden Fossa, MD 04/03/15 [phone]

## 2015-04-03 NOTE — ED Notes (Signed)
Pt c/o intermittent shortness of breath onset after arriving to ED. Pt has a pacemaker to L chest; requesting cardiologist due to "it makes a noise." Pt has had pacemaker for "7-8 years and I think they changed the battery 2 years ago"

## 2015-04-03 NOTE — ED Notes (Signed)
Pt taken to XRAY

## 2015-04-05 ENCOUNTER — Encounter (HOSPITAL_COMMUNITY): Payer: Commercial Managed Care - HMO

## 2015-04-05 LAB — CULTURE, GROUP A STREP: Strep A Culture: NEGATIVE

## 2015-04-08 ENCOUNTER — Ambulatory Visit (INDEPENDENT_AMBULATORY_CARE_PROVIDER_SITE_OTHER): Payer: Commercial Managed Care - HMO | Admitting: *Deleted

## 2015-04-08 DIAGNOSIS — I5022 Chronic systolic (congestive) heart failure: Secondary | ICD-10-CM

## 2015-04-08 DIAGNOSIS — I255 Ischemic cardiomyopathy: Secondary | ICD-10-CM | POA: Diagnosis not present

## 2015-04-10 NOTE — Progress Notes (Signed)
Remote ICD transmission.   

## 2015-04-12 LAB — CUP PACEART REMOTE DEVICE CHECK
Battery Remaining Longevity: 79 mo
Brady Statistic AP VS Percent: 0 %
Brady Statistic AS VP Percent: 95.88 %
Date Time Interrogation Session: 20161031154911
HIGH POWER IMPEDANCE MEASURED VALUE: 49 Ohm
HighPow Impedance: 38 Ohm
Implantable Lead Implant Date: 20060307
Implantable Lead Location: 753858
Implantable Lead Model: 5071
Implantable Lead Model: 5076
Lead Channel Impedance Value: 4047 Ohm
Lead Channel Impedance Value: 570 Ohm
Lead Channel Pacing Threshold Amplitude: 1.25 V
Lead Channel Pacing Threshold Pulse Width: 0.4 ms
Lead Channel Pacing Threshold Pulse Width: 0.4 ms
Lead Channel Sensing Intrinsic Amplitude: 1.25 mV
Lead Channel Sensing Intrinsic Amplitude: 24.875 mV
Lead Channel Sensing Intrinsic Amplitude: 24.875 mV
Lead Channel Setting Pacing Amplitude: 1.5 V
Lead Channel Setting Pacing Amplitude: 2.5 V
Lead Channel Setting Pacing Pulse Width: 0.4 ms
MDC IDC LEAD IMPLANT DT: 20080110
MDC IDC LEAD IMPLANT DT: 20090617
MDC IDC LEAD LOCATION: 753859
MDC IDC LEAD LOCATION: 753860
MDC IDC MSMT BATTERY VOLTAGE: 2.97 V
MDC IDC MSMT LEADCHNL LV IMPEDANCE VALUE: 342 Ohm
MDC IDC MSMT LEADCHNL LV IMPEDANCE VALUE: 4047 Ohm
MDC IDC MSMT LEADCHNL LV PACING THRESHOLD AMPLITUDE: 1.125 V
MDC IDC MSMT LEADCHNL RA PACING THRESHOLD AMPLITUDE: 0.5 V
MDC IDC MSMT LEADCHNL RA SENSING INTR AMPL: 1.25 mV
MDC IDC MSMT LEADCHNL RV IMPEDANCE VALUE: 437 Ohm
MDC IDC MSMT LEADCHNL RV IMPEDANCE VALUE: 494 Ohm
MDC IDC MSMT LEADCHNL RV PACING THRESHOLD PULSEWIDTH: 0.4 ms
MDC IDC SET LEADCHNL RV PACING AMPLITUDE: 2.5 V
MDC IDC SET LEADCHNL RV PACING PULSEWIDTH: 0.4 ms
MDC IDC SET LEADCHNL RV SENSING SENSITIVITY: 0.45 mV
MDC IDC STAT BRADY AP VP PERCENT: 0 %
MDC IDC STAT BRADY AS VS PERCENT: 4.12 %
MDC IDC STAT BRADY RA PERCENT PACED: 0 %
MDC IDC STAT BRADY RV PERCENT PACED: 94.8 %

## 2015-04-15 ENCOUNTER — Encounter: Payer: Self-pay | Admitting: Cardiology

## 2015-05-21 ENCOUNTER — Other Ambulatory Visit: Payer: Self-pay | Admitting: Cardiology

## 2015-05-21 ENCOUNTER — Telehealth (HOSPITAL_COMMUNITY): Payer: Self-pay

## 2015-05-21 NOTE — Telephone Encounter (Signed)
Patient's dtr called distraught that patient had not taken any of his medications for a very long time (months) and had not been feeling well so she took him to his PCP Dr. Henrine Screws. Per dtr, Dr. Abigail Miyamoto told pt he "had CHF but was not in distress."  This concerned the dtr and she is frantic to get patient into clinic now, however, clinic is about to close for the day.  States she does not know if his weight is up from baseline because he does not weigh and she doesn't know what his normal is, however, he does not seem to be swollen.  Also denies SOB.  Explained that patients have chronic CHF and can be stable, and the fact that PCP mentioned he was not in distress is promising that he is not in acute CHF exacerbation and tried to explain what this means to pt dtr. Pts dtr states there are 10 new prescriptions at the pharmacy, however, our CHF clinic has not seen the patient in months and has not sent anything recent to his pharmacy.  Upon speaking with the pharmacist persoanlly, these medicatiosn were all sent in by the PCP patient had seen this morning.  Patient's dtr wants to know if we want him on these.  Explained we must see him first and to go with what PCP has prescribed until we can fit him into clinic tomorrow afternoon.  Advised to bring ALL medications to clinic so we can dispose of old prescriptions so patient does not get confused. Will make providers aware.  Would benefit from [facility] RN for education, diet and fluid management, and medications.

## 2015-05-22 ENCOUNTER — Other Ambulatory Visit: Payer: Self-pay

## 2015-05-22 ENCOUNTER — Ambulatory Visit (HOSPITAL_BASED_OUTPATIENT_CLINIC_OR_DEPARTMENT_OTHER)
Admission: RE | Admit: 2015-05-22 | Discharge: 2015-05-22 | Disposition: A | Discharge status: Home / Self Care | Payer: Commercial Managed Care - HMO | Source: Ambulatory Visit | Attending: Family Medicine | Admitting: Family Medicine

## 2015-05-22 ENCOUNTER — Telehealth: Payer: Self-pay | Admitting: Student

## 2015-05-22 ENCOUNTER — Encounter (HOSPITAL_COMMUNITY): Payer: Self-pay

## 2015-05-22 ENCOUNTER — Emergency Department (HOSPITAL_COMMUNITY): Payer: Commercial Managed Care - HMO

## 2015-05-22 ENCOUNTER — Inpatient Hospital Stay (HOSPITAL_COMMUNITY)
Admission: EM | Admit: 2015-05-22 | Discharge: 2015-05-24 | DRG: 291 | Disposition: A | Discharge status: Home / Self Care | Payer: Commercial Managed Care - HMO | Attending: Internal Medicine | Admitting: Internal Medicine

## 2015-05-22 ENCOUNTER — Encounter (HOSPITAL_COMMUNITY): Payer: Self-pay | Admitting: Vascular Surgery

## 2015-05-22 VITALS — BP 128/76 | HR 90 | Wt 170.2 lb

## 2015-05-22 DIAGNOSIS — F1010 Alcohol abuse, uncomplicated: Secondary | ICD-10-CM | POA: Diagnosis present

## 2015-05-22 DIAGNOSIS — F17210 Nicotine dependence, cigarettes, uncomplicated: Secondary | ICD-10-CM | POA: Diagnosis present

## 2015-05-22 DIAGNOSIS — Z79899 Other long term (current) drug therapy: Secondary | ICD-10-CM

## 2015-05-22 DIAGNOSIS — I10 Essential (primary) hypertension: Secondary | ICD-10-CM | POA: Diagnosis not present

## 2015-05-22 DIAGNOSIS — J449 Chronic obstructive pulmonary disease, unspecified: Secondary | ICD-10-CM | POA: Diagnosis present

## 2015-05-22 DIAGNOSIS — I5041 Acute combined systolic (congestive) and diastolic (congestive) heart failure: Secondary | ICD-10-CM | POA: Diagnosis not present

## 2015-05-22 DIAGNOSIS — Z95810 Presence of automatic (implantable) cardiac defibrillator: Secondary | ICD-10-CM | POA: Diagnosis not present

## 2015-05-22 DIAGNOSIS — R0602 Shortness of breath: Secondary | ICD-10-CM | POA: Diagnosis not present

## 2015-05-22 DIAGNOSIS — Z9114 Patient's other noncompliance with medication regimen: Secondary | ICD-10-CM | POA: Diagnosis not present

## 2015-05-22 DIAGNOSIS — Z8673 Personal history of transient ischemic attack (TIA), and cerebral infarction without residual deficits: Secondary | ICD-10-CM | POA: Diagnosis not present

## 2015-05-22 DIAGNOSIS — Z7901 Long term (current) use of anticoagulants: Secondary | ICD-10-CM | POA: Diagnosis not present

## 2015-05-22 DIAGNOSIS — I639 Cerebral infarction, unspecified: Secondary | ICD-10-CM | POA: Diagnosis present

## 2015-05-22 DIAGNOSIS — I252 Old myocardial infarction: Secondary | ICD-10-CM

## 2015-05-22 DIAGNOSIS — F329 Major depressive disorder, single episode, unspecified: Secondary | ICD-10-CM

## 2015-05-22 DIAGNOSIS — I2510 Atherosclerotic heart disease of native coronary artery without angina pectoris: Secondary | ICD-10-CM | POA: Diagnosis present

## 2015-05-22 DIAGNOSIS — J810 Acute pulmonary edema: Secondary | ICD-10-CM

## 2015-05-22 DIAGNOSIS — I110 Hypertensive heart disease with heart failure: Principal | ICD-10-CM | POA: Diagnosis present

## 2015-05-22 DIAGNOSIS — Z952 Presence of prosthetic heart valve: Secondary | ICD-10-CM | POA: Diagnosis not present

## 2015-05-22 DIAGNOSIS — Z951 Presence of aortocoronary bypass graft: Secondary | ICD-10-CM | POA: Diagnosis not present

## 2015-05-22 DIAGNOSIS — I5023 Acute on chronic systolic (congestive) heart failure: Secondary | ICD-10-CM | POA: Diagnosis present

## 2015-05-22 DIAGNOSIS — Z9181 History of falling: Secondary | ICD-10-CM

## 2015-05-22 DIAGNOSIS — F32A Depression, unspecified: Secondary | ICD-10-CM | POA: Diagnosis present

## 2015-05-22 DIAGNOSIS — I739 Peripheral vascular disease, unspecified: Secondary | ICD-10-CM | POA: Diagnosis present

## 2015-05-22 DIAGNOSIS — Z9981 Dependence on supplemental oxygen: Secondary | ICD-10-CM | POA: Diagnosis not present

## 2015-05-22 DIAGNOSIS — I442 Atrioventricular block, complete: Secondary | ICD-10-CM | POA: Diagnosis not present

## 2015-05-22 DIAGNOSIS — I255 Ischemic cardiomyopathy: Secondary | ICD-10-CM | POA: Diagnosis present

## 2015-05-22 DIAGNOSIS — I482 Chronic atrial fibrillation, unspecified: Secondary | ICD-10-CM

## 2015-05-22 DIAGNOSIS — E785 Hyperlipidemia, unspecified: Secondary | ICD-10-CM | POA: Diagnosis present

## 2015-05-22 DIAGNOSIS — I481 Persistent atrial fibrillation: Secondary | ICD-10-CM | POA: Diagnosis present

## 2015-05-22 DIAGNOSIS — I480 Paroxysmal atrial fibrillation: Secondary | ICD-10-CM | POA: Diagnosis not present

## 2015-05-22 DIAGNOSIS — F319 Bipolar disorder, unspecified: Secondary | ICD-10-CM | POA: Diagnosis present

## 2015-05-22 DIAGNOSIS — J9620 Acute and chronic respiratory failure, unspecified whether with hypoxia or hypercapnia: Secondary | ICD-10-CM | POA: Diagnosis not present

## 2015-05-22 DIAGNOSIS — I513 Intracardiac thrombosis, not elsewhere classified: Secondary | ICD-10-CM | POA: Diagnosis present

## 2015-05-22 DIAGNOSIS — Z955 Presence of coronary angioplasty implant and graft: Secondary | ICD-10-CM | POA: Diagnosis not present

## 2015-05-22 DIAGNOSIS — I426 Alcoholic cardiomyopathy: Secondary | ICD-10-CM | POA: Diagnosis present

## 2015-05-22 DIAGNOSIS — F419 Anxiety disorder, unspecified: Secondary | ICD-10-CM | POA: Diagnosis present

## 2015-05-22 DIAGNOSIS — J438 Other emphysema: Secondary | ICD-10-CM | POA: Diagnosis not present

## 2015-05-22 DIAGNOSIS — Z86718 Personal history of other venous thrombosis and embolism: Secondary | ICD-10-CM | POA: Diagnosis not present

## 2015-05-22 DIAGNOSIS — R609 Edema, unspecified: Secondary | ICD-10-CM

## 2015-05-22 LAB — BASIC METABOLIC PANEL
ANION GAP: 9 (ref 5–15)
ANION GAP: 9 (ref 5–15)
BUN: 15 mg/dL (ref 6–20)
BUN: 14 mg/dL (ref 6–20)
CALCIUM: 9 mg/dL (ref 8.9–10.3)
CALCIUM: 9.1 mg/dL (ref 8.9–10.3)
CO2: 29 mmol/L (ref 22–32)
CO2: 30 mmol/L (ref 22–32)
CREATININE: 1.05 mg/dL (ref 0.61–1.24)
Chloride: 103 mmol/L (ref 101–111)
Chloride: 101 mmol/L (ref 101–111)
Creatinine, Ser: 1.16 mg/dL (ref 0.61–1.24)
GFR calc Af Amer: >60 mL/min (ref >60)
GFR calc Af Amer: >60 mL/min (ref >60)
GLUCOSE: 59 mg/dL — AB (ref 65–99)
GLUCOSE: 106 mg/dL — AB (ref 65–99)
POTASSIUM: 4.2 mmol/L (ref 3.5–5.1)
Potassium: 4.2 mmol/L (ref 3.5–5.1)
Sodium: 141 mmol/L (ref 135–145)
Sodium: 140 mmol/L (ref 135–145)

## 2015-05-22 LAB — I-STAT TROPONIN, ED: Troponin i, poc: 0.05 ng/mL (ref 0.00–0.08)

## 2015-05-22 LAB — CBC
HEMATOCRIT: 44.8 % (ref 39.0–52.0)
HEMATOCRIT: 44.8 % (ref 39.0–52.0)
HEMOGLOBIN: 14 g/dL (ref 13.0–17.0)
Hemoglobin: 13.7 g/dL (ref 13.0–17.0)
MCH: 28 pg (ref 26.0–34.0)
MCH: 28.5 pg (ref 26.0–34.0)
MCHC: 30.6 g/dL (ref 30.0–36.0)
MCHC: 31.3 g/dL (ref 30.0–36.0)
MCV: 91.6 fL (ref 78.0–100.0)
MCV: 91.2 fL (ref 78.0–100.0)
PLATELETS: 189 10*3/uL (ref 150–400)
Platelets: 205 10*3/uL (ref 150–400)
RBC: 4.89 MIL/uL (ref 4.22–5.81)
RBC: 4.91 MIL/uL (ref 4.22–5.81)
RDW: 17.4 % — AB (ref 11.5–15.5)
RDW: 17.5 % — AB (ref 11.5–15.5)
WBC: 9.7 10*3/uL (ref 4.0–10.5)
WBC: 10 10*3/uL (ref 4.0–10.5)

## 2015-05-22 LAB — PROTIME-INR
INR: 1.57 — ABNORMAL HIGH (ref 0.00–1.49)
PROTHROMBIN TIME: 18.8 s — AB (ref 11.6–15.2)

## 2015-05-22 LAB — TROPONIN I: TROPONIN I: 0.09 ng/mL — AB (ref <0.031)

## 2015-05-22 LAB — BRAIN NATRIURETIC PEPTIDE: B NATRIURETIC PEPTIDE 5: 604.2 pg/mL — AB (ref 0.0–100.0)

## 2015-05-22 LAB — APTT: aPTT: 36 seconds (ref 24–37)

## 2015-05-22 MED ORDER — THIAMINE HCL 100 MG/ML IJ SOLN: 100.0000 mg | Freq: Every day | INTRAMUSCULAR | Status: DC

## 2015-05-22 MED ORDER — NITROGLYCERIN 0.4 MG SL SUBL: 0.4000 mg | SUBLINGUAL_TABLET | SUBLINGUAL | Status: DC | PRN

## 2015-05-22 MED ORDER — FOLIC ACID 1 MG PO TABS
1.0000 mg | ORAL_TABLET | Freq: Every day | ORAL | Status: DC
  Administered 2015-05-23 – 2015-05-24 (×3): 1 mg via ORAL
  Filled 2015-05-22 (×3): qty 1

## 2015-05-22 MED ORDER — LORAZEPAM 2 MG/ML IJ SOLN: 0.0000 mg | Freq: Four times a day (QID) | INTRAMUSCULAR | Status: DC

## 2015-05-22 MED ORDER — FUROSEMIDE 10 MG/ML IJ SOLN
40.0000 mg | Freq: Two times a day (BID) | INTRAMUSCULAR | Status: DC
  Administered 2015-05-23: 40 mg via INTRAVENOUS
  Filled 2015-05-22: qty 4

## 2015-05-22 MED ORDER — LORAZEPAM 2 MG/ML IJ SOLN: 0.0000 mg | Freq: Two times a day (BID) | INTRAMUSCULAR | Status: DC

## 2015-05-22 MED ORDER — SODIUM CHLORIDE 0.9 % IJ SOLN
3.0000 mL | Freq: Two times a day (BID) | INTRAMUSCULAR | Status: DC
  Administered 2015-05-22 – 2015-05-23 (×3): 3 mL via INTRAVENOUS

## 2015-05-22 MED ORDER — SODIUM CHLORIDE 0.9 % IJ SOLN: 3.0000 mL | INTRAMUSCULAR | Status: DC | PRN

## 2015-05-22 MED ORDER — DIGOXIN 125 MCG PO TABS
0.1250 mg | ORAL_TABLET | Freq: Every day | ORAL | Status: DC
  Administered 2015-05-23 – 2015-05-24 (×3): 0.125 mg via ORAL
  Filled 2015-05-22 (×3): qty 1

## 2015-05-22 MED ORDER — VITAMIN B-1 100 MG PO TABS
100.0000 mg | ORAL_TABLET | Freq: Every day | ORAL | Status: DC
  Administered 2015-05-23 – 2015-05-24 (×3): 100 mg via ORAL
  Filled 2015-05-22 (×3): qty 1

## 2015-05-22 MED ORDER — DIGOXIN 125 MCG PO TABS: 0.1250 mg | ORAL_TABLET | Freq: Every day | ORAL | Status: DC

## 2015-05-22 MED ORDER — SPIRONOLACTONE 25 MG PO TABS
25.0000 mg | ORAL_TABLET | Freq: Every day | ORAL | Status: DC
  Administered 2015-05-23 – 2015-05-24 (×2): 25 mg via ORAL
  Filled 2015-05-22 (×2): qty 1

## 2015-05-22 MED ORDER — APIXABAN 2.5 MG PO TABS
2.5000 mg | ORAL_TABLET | Freq: Two times a day (BID) | ORAL | Status: DC
  Administered 2015-05-23 (×2): 2.5 mg via ORAL
  Filled 2015-05-22 (×2): qty 1

## 2015-05-22 MED ORDER — ALBUTEROL SULFATE (2.5 MG/3ML) 0.083% IN NEBU: 2.5000 mg | INHALATION_SOLUTION | Freq: Four times a day (QID) | RESPIRATORY_TRACT | Status: DC | PRN

## 2015-05-22 MED ORDER — SODIUM CHLORIDE 0.9 % IV SOLN: 250.0000 mL | INTRAVENOUS | Status: DC | PRN

## 2015-05-22 MED ORDER — FENOFIBRATE 54 MG PO TABS
54.0000 mg | ORAL_TABLET | Freq: Every day | ORAL | Status: DC
  Administered 2015-05-23 – 2015-05-24 (×2): 54 mg via ORAL
  Filled 2015-05-22 (×2): qty 1

## 2015-05-22 MED ORDER — ONDANSETRON HCL 4 MG/2ML IJ SOLN: 4.0000 mg | Freq: Four times a day (QID) | INTRAMUSCULAR | Status: DC | PRN

## 2015-05-22 MED ORDER — LORAZEPAM 1 MG PO TABS: 1.0000 mg | ORAL_TABLET | Freq: Four times a day (QID) | ORAL | Status: DC | PRN

## 2015-05-22 MED ORDER — ADULT MULTIVITAMIN W/MINERALS CH
1.0000 | ORAL_TABLET | Freq: Every day | ORAL | Status: DC
  Administered 2015-05-23 – 2015-05-24 (×3): 1 via ORAL
  Filled 2015-05-22 (×3): qty 1

## 2015-05-22 MED ORDER — NICOTINE 21 MG/24HR TD PT24
21.0000 mg | MEDICATED_PATCH | Freq: Every day | TRANSDERMAL | Status: DC
  Administered 2015-05-23 – 2015-05-24 (×3): 21 mg via TRANSDERMAL
  Filled 2015-05-22 (×3): qty 1

## 2015-05-22 MED ORDER — ALBUTEROL SULFATE HFA 108 (90 BASE) MCG/ACT IN AERS: 2.0000 | INHALATION_SPRAY | Freq: Four times a day (QID) | RESPIRATORY_TRACT | Status: DC | PRN

## 2015-05-22 MED ORDER — LORAZEPAM 2 MG/ML IJ SOLN: 1.0000 mg | Freq: Four times a day (QID) | INTRAMUSCULAR | Status: DC | PRN

## 2015-05-22 MED ORDER — ACETAMINOPHEN 325 MG PO TABS: 650.0000 mg | ORAL_TABLET | ORAL | Status: DC | PRN

## 2015-05-22 MED ORDER — FUROSEMIDE 20 MG PO TABS: 40.0000 mg | ORAL_TABLET | Freq: Two times a day (BID) | ORAL | Status: DC

## 2015-05-22 MED ORDER — FUROSEMIDE 10 MG/ML IJ SOLN
40.0000 mg | Freq: Once | INTRAMUSCULAR | Status: DC
  Filled 2015-05-22: qty 4

## 2015-05-22 MED ORDER — FUROSEMIDE 10 MG/ML IJ SOLN
40.0000 mg | Freq: Two times a day (BID) | INTRAMUSCULAR | Status: DC
  Administered 2015-05-22: 40 mg via INTRAVENOUS

## 2015-05-22 MED ORDER — ATORVASTATIN CALCIUM 20 MG PO TABS
20.0000 mg | ORAL_TABLET | Freq: Every day | ORAL | Status: DC
  Administered 2015-05-23: 20 mg via ORAL
  Filled 2015-05-22: qty 1

## 2015-05-22 MED ORDER — LISINOPRIL 5 MG PO TABS
5.0000 mg | ORAL_TABLET | Freq: Every day | ORAL | Status: DC
  Administered 2015-05-23 – 2015-05-24 (×3): 5 mg via ORAL
  Filled 2015-05-22 (×3): qty 1

## 2015-05-22 NOTE — ED Provider Notes (Signed)
I saw and evaluated the patient, reviewed the resident's note and I agree with the findings and plan.  Likely CHF exacerbation. Tachypneic, fine rales, BLE edema. No CP right now. requiring O2, which is not his normal. Likely need admission 2/2 o2 requirement.   EKG Interpretation   Date/Time:  Wednesday May 22 2015 18:49:46 EST Ventricular Rate:  103 PR Interval:  118 QRS Duration: 162 QT Interval:  422 QTC Calculation: 552 R Axis:   -126 Text Interpretation:  Atrial-sensed ventricular-paced rhythm Biventricular  pacemaker detected Abnormal ECG Paced rhythm new.  Confirmed by [facility] MD,  Brynnleigh Mcelwee [phone]) on 05/22/2015 9:10:35 PM        Marily Memos, MD 05/22/15 [phone]
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
## 2015-05-22 NOTE — Telephone Encounter (Signed)
Received call from patient's daughter due to her being in the [facility] ED waiting room for possible admission.   She reports he has been having worsening shortness of breath and was seen in the office today. In looking at his note, his medications were adjusted today and close follow-up was arranged next week.  She wanted the patient directly admitted to the hospital due to a legal dispute between her and siblings. I informed her he would have to be formally evaluated in the ED and they would call Cardiology if necessary for evaluation.  Signed, Ellsworth Lennox, PA-C 05/22/2015, 6:28 PM Pager: [phone]

## 2015-05-22 NOTE — H&P (Signed)
[name] History and Physical  [name]:[id number] DOB: Jun 23, 1945 DOA: 05/22/2015  Referring physician: ED physician PCP: Cain Saupe, MD  Specialists:   Chief Complaint: SOB  HPI: [name] is a 69 y.o. male with PMH of hypertension, hyperlipidemia, depression, anxiety, DVT, atrial fibrillation on Eliquis, COPD on home oxygen, CAD, systolic congestive heart failure (EF 30%), bipolar disorder, alcohol abuse, tobacco abuse, stroke, complete heart block, s/p of AICD, who presents with SOB.  Patient reports that he has been having worsening shortness of breath in the past 2 weeks. He does not have cough and chest pain. No fever or chills. His shortness of breath has been progressively getting worse. He was seen by his cardiologist, Dr. Shirlee Latch today, who adjusted his Lasix dosage, increased from 40 mg daily to 40 mg twice a day. Patient does not have abdominal pain, diarrhea, symptoms of UTI, unilateral weakness.  In ED, patient was found to have BMP 592.8, negative troponin, WBC 10.0, temperature normal, mildly tachycardia, electrolytes and renal function okay. Chest x-ray showed chronic bronchitis change. Patient admitted to inpatient for further direction treatment.  Where does patient live?   At home   Can patient participate in ADLs?  Little Review of Systems:   General: no fevers, chills, has poor appetite, has fatigue HEENT: no blurry vision, hearing changes or sore throat Pulm: has dyspnea, no coughing, wheezing CV: no chest pain, palpitations Abd: no nausea, vomiting, abdominal pain, diarrhea, constipation GU: no dysuria, burning on urination, increased urinary frequency, hematuria  Ext: has leg edema Neuro: no unilateral weakness, numbness, or tingling, no vision change or hearing loss Skin: no rash MSK: No muscle spasm, no deformity, no limitation of range of movement in spin Heme: No easy bruising.  Travel history: No recent long distant  travel.  Allergy:  Allergies  Allergen Reactions  . Warfarin Other (See Comments)    Non compliance and ETOH abuse    Past Medical History  Diagnosis Date  . DVT (deep venous thrombosis) (HCC)   . Mitral valve disease     a. remote mitral replacement with Bjork Shiley valve;  b. 06/2006 Redo MVR with tissue valve.  Marland Kitchen COPD (chronic obstructive pulmonary disease) (HCC)   . Depression   . Hypertension   . Dyslipidemia   . Ventral hernia   . CAD (coronary artery disease)     a. s/p prior PCI/stenting of the LCX;  b. 08/2014 MV: EF 20%, large septal, apical, and inferior infarct from apex to base, no ischemia;  c. 08/2014 NSTEMI/Cath: LM mod distal dzs extending into ostial LCX (80%), LAD tortuous, RI nl, OM mod dzs, RCA 100 CTO with R->R and L->R collats-->Med Rx.  . Mixed Ischemic and Nonischemic Cardiomyopathy     a. 8/.2015 Echo: Ef 25-30%;  b. 01/2014 s/p MDT NFAO1H0 Talbot Grumbling XT CRT-D (ser # [id number]).  . Chronic systolic congestive heart failure (HCC)     a. 01/2014 Echo: EF 25-30%, mod conc LVH, mod dil LA.  . Bipolar disorder (HCC)   . Anxiety   . Persistent atrial fibrillation (HCC)     a. CHA2DS2VASc = 6->eliquis;  b. S/P AVN RFCA an BiV ICD placement.  . Alcohol abuse   . Complete heart block (HCC)     a. In setting of prior AV nodal ablation r/t afib-->BiV ICD (01/2014).  . Stroke (HCC)   . CVA (cerebral vascular accident) (HCC)     a. Multiple prior embolic strokes.  . Biventricular automatic implantable  cardioverter defibrillator in situ     a. 01/2014 s/p MDT DTBA1D1 Viva XT CRT-D (ser # [id number]).    Past Surgical History  Procedure Laterality Date  . Cystoscopy w/ ureteral stent placement  07/12/2011    Procedure: CYSTOSCOPY WITH RETROGRADE PYELOGRAM/URETERAL STENT PLACEMENT;  Surgeon: Sebastian Ache, MD;  Location: [facility] OR;  Service: Urology;  Laterality: Left;  . Hernia repair    . Mitral valve replacement      remote [facility] valve 2376 with redo tissue valve 06/2006  .  Pacemaker placement  2006    Changed to CRT-D in 2009  . Av node ablation  2007  . Icd generator change  02/01/2014    Gen change to: Medtronic VIVA pulse generator, serial number [id number]  . Cardiac catheterization  06/2006    Mild ostial L main stenosis, CFX stent patent, RCA occluded (old)  . Coronary artery bypass graft  2008  . Pacemaker generator change N/A 02/01/2014    Procedure: PACEMAKER GENERATOR CHANGE;  Surgeon: Duke Salvia, MD;  Location[facility] CATH LAB;  Service: Cardiovascular;  Laterality: N/A;  . Left heart catheterization with coronary angiogram N/A 08/23/2014    Procedure: LEFT HEART CATHETERIZATION WITH CORONARY ANGIOGRAM;  Surgeon: Corky Crafts, MD;  Location: [facility] CATH LAB;  Service: Cardiovascular;  Laterality: N/A;    Social History:  reports that he has been smoking Cigarettes.  He has been smoking about 2.00 packs per day. He has never used smokeless tobacco. He reports that he drinks about 18.0 oz of alcohol per week. He reports that he does not use illicit drugs.  Family History:  Family History  Problem Relation Age of Onset  . Stroke    . Heart disease    . Alzheimer's disease Mother   . Heart attack Father   . Heart attack Son   . Varicose Veins Son   . Deep vein thrombosis Son      Prior to Admission medications   Medication Sig Start Date End Date Taking? Authorizing Provider  atorvastatin (LIPITOR) 20 MG tablet TAKE 1 TABLET BY MOUTH EVERY DAY 05/31/14  Yes [name], MD  digoxin (LANOXIN) 0.125 MG tablet Take 1 tablet (0.125 mg total) by mouth daily. 05/22/15  Yes [name], MD  ELIQUIS 2.5 MG TABS tablet Take 2.5 mg by mouth 2 (two) times daily. 05/21/15  Yes Historical Provider, MD  fenofibrate (TRICOR) 48 MG tablet Take 48 mg by mouth daily. 05/21/15  Yes Historical Provider, MD  furosemide (LASIX) 20 MG tablet Take 2 tablets (40 mg total) by mouth 2 (two) times daily. 05/22/15  Yes [name], MD  lisinopril  (PRINIVIL,ZESTRIL) 5 MG tablet Take 5 mg by mouth daily.   Yes Historical Provider, MD  nitroGLYCERIN (NITROSTAT) 0.4 MG SL tablet Place 1 tablet (0.4 mg total) under the tongue every 5 (five) minutes as needed for chest pain. 01/06/15  Yes [name], PA-C  spironolactone (ALDACTONE) 25 MG tablet Take 25 mg by mouth daily.   Yes Historical Provider, MD  VENTOLIN HFA 108 (90 BASE) MCG/ACT inhaler Inhale 2 puffs into the lungs every 6 (six) hours as needed. Reported on 05/22/2015 09/24/14  Yes Historical Provider, MD  isosorbide mononitrate (IMDUR) 30 MG 24 hr tablet Take 1 tablet (30 mg total) by mouth daily. Patient not taking: Reported on 05/22/2015 01/07/15   Allayne Butcher, PA-C    Physical Exam: Filed Vitals:   05/22/15 2130 05/22/15 2159 05/23/15 0004  05/23/15 0533  BP: 123/69 118/81 136/64 119/66  Pulse: 62 61 62 58  Temp:  98.1 F (36.7 C)  97.7 F (36.5 C)  TempSrc:  Oral  Oral  Resp: Height:   (1.702 m)    Weight:  72.4 kg (159 lb 9.8 oz)  72 kg (158 lb 11.7 oz)  SpO2: 92% 98%  95%   General: Not in acute distress HEENT:       Eyes: PERRL, EOMI, no scleral icterus.       ENT: No discharge from the ears and nose, no pharynx injection, no tonsillar enlargement.        Neck: positive JVD, no bruit, no mass felt. Heme: No neck lymph node enlargement. Cardiac: S1/S2, RRR, No murmurs, No gallops or rubs. Pulm:  No rales, wheezing, rhonchi or rubs. Abd: Soft, nondistended, nontender, no rebound pain, no organomegaly, BS present. Has a large reducible ventral hernia, no tenderness. Ext: 2+ pitting leg edema bilaterally. 2+DP/PT pulse bilaterally. Musculoskeletal: No joint deformities, No joint redness or warmth, no limitation of ROM in spin. Skin: No rashes.  Neuro: Alert, oriented X3, cranial nerves II-XII grossly intact, muscle strength 5/5 in all extremities, sensation to light touch intact.  Psych: Patient is not psychotic, no suicidal or hemocidal  ideation.  Labs on Admission:  Basic Metabolic Panel:  Recent Labs Lab 05/22/15 1645 05/22/15 1916 05/23/15 0341  NA 141 140 140  K 4.2 4.2 4.2  CL 103 101 100*  CO2 29 30 33*  GLUCOSE 59* 106* 100*  BUN CREATININE 1.05 1.16 1.09  CALCIUM 9.0 9.1 8.8*  MG  --   --  1.8   Liver Function Tests: No results for input(s): AST, ALT, ALKPHOS, BILITOT, PROT, ALBUMIN in the last 168 hours. No results for input(s): LIPASE, AMYLASE in the last 168 hours. No results for input(s): AMMONIA in the last 168 hours. CBC:  Recent Labs Lab 05/22/15 1645 05/22/15 1916  WBC 9.7 10.0  HGB 13.7 14.0  HCT 44.8 44.8  MCV 91.6 91.2  PLT 189 205   Cardiac Enzymes:  Recent Labs Lab 05/22/15 2257 05/23/15 0341  TROPONINI 0.09* 0.11*    BNP (last 3 results)  Recent Labs  01/27/15 2332 05/22/15 1645 05/22/15 2107  BNP 484.8* 604.2* 592.8*    ProBNP (last 3 results) No results for input(s): PROBNP in the last 8760 hours.  CBG: No results for input(s): GLUCAP in the last 168 hours.  Radiological Exams on Admission: Dg Chest 2 View  05/22/2015  CLINICAL DATA:  Shortness of breath today, former smoker, hypertension, CHF, COPD, coronary artery disease, stroke EXAM: CHEST  2 VIEW COMPARISON:  04/03/2015 FINDINGS: LEFT subclavian transvenous pacemaker and AICD leads projecting at RIGHT atrium and RIGHT ventricle. Epicardial pacing leads also present. Enlargement of cardiac silhouette post MVR. Pulmonary vascular congestion. Peribronchial thickening with scattered chronic interstitial changes, stable. Blunting of the LEFT lateral costophrenic angle with calcified pleural plaque formation LEFT chest No acute infiltrate, pleural effusion or pneumothorax. Bones demineralized with multiple old RIGHT rib fractures. IMPRESSION: Enlargement of cardiac silhouette post pacemaker, AICD and MVR. Chronic bronchitic interstitial changes. No acute abnormalities. Electronically Signed   By: Ulyses Southward M.D.   On: 05/22/2015 19:07    EKG: Independently reviewed.  QTC 467, T-wave inversion in V1-V2, poor R-wave progression   Assessment/Plan Principal Problem:   Acute on chronic systolic congestive heart failure (HCC) Active Problems:   Atrial  fibrillation (HCC)   CAD (coronary artery disease)   Right atrial thrombus (HCC)   Atrial fibrillation (HCC)   Complete heart block (HCC)   Severe chronic obstructive pulmonary disease (HCC)   Hyperlipidemia   Automatic implantable cardioverter-defibrillator in situ   Hypertension   PVD (peripheral vascular disease) (HCC)   Chronic anticoagulation   COPD (chronic obstructive pulmonary disease) (HCC)   Depression   Alcohol abuse   Stroke (HCC)   CHF exacerbation (HCC)  Acute on chronic respiratory failure due to acute on chronic systolic congestive heart failure: Patient's shortness of breath, 2+ leg edema, positive JVD and elevated BNP are consistent CHF exacerbation.  -will admit to Telemetry bed. -will treat with IV lasix 40 mg bid -continue spironolactone and Imdur -will cycle CE X3 -will get 2-D echo to evaluate EF -strict In/Out -Daily body weight. -heart diet  CAD: s/p of CABG. No CP. -on lipitor and Eliquis -when necessary nitroglycerin  Atrial Fibrillation: CHA2DS2-VASc Score is 6, needs oral anticoagulation. Patient is Eliquis at home. INR is not done on admission. Heart rate is controlled. -continue Eliquis  -digoxin  Hx of DVT -On Eliquis  Complete heart block: -has AICD  HLD: Last LDL was 44 on 10/12/14 -Continue home medications: Lipitor and fenofibrate  HTN: -Lasix, spironolactone, lisinopril -Hydralazine when necessary  COPD (chronic obstructive pulmonary disease): no wheezing on auscultation. -prn albuterol  Tobacco abuse and Alcohol abuse: -Did counseling about importance of quitting smoking -Nicotine patch -Did counseling about the importance of quitting drinking -CIWA protocol  DVT ppx:  on Eliquis  Code Status: Full code Family Communication: None at bed side.  Disposition Plan: Admit to inpatient   Date of Service 05/23/2015    Lorretta Harp [name] Pager [phone]  If 7PM-7AM, please contact night-coverage [email or web address] Password [email or web address] 05/23/2015, 6:11 AM
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
## 2015-05-22 NOTE — ED Provider Notes (Signed)
CSN: [id number]     Arrival date & time 05/22/15  1826 History   First MD Initiated Contact with Patient 05/22/15 1921     Chief Complaint  Patient presents with  . Congestive Heart Failure   Patient is a 69 y.o. male presenting with shortness of breath. The history is provided by the patient and a relative.  Shortness of Breath Severity:  Moderate Onset quality:  Gradual Duration:  2 days Timing:  Constant Progression:  Worsening Chronicity:  New Relieved by:  Nothing Associated symptoms: cough   Associated symptoms: no abdominal pain, no chest pain, no fever, no headaches, no hemoptysis, no neck pain, no rash, no sore throat, no sputum production and no vomiting   Associated symptoms comment:  Leg swelling, abdominal distention    Past Medical History  Diagnosis Date  . DVT (deep venous thrombosis) (HCC)   . Mitral valve disease     a. remote mitral replacement with Bjork Shiley valve;  b. 06/2006 Redo MVR with tissue valve.  Marland Kitchen COPD (chronic obstructive pulmonary disease) (HCC)   . Depression   . Hypertension   . Dyslipidemia   . Ventral hernia   . CAD (coronary artery disease)     a. s/p prior PCI/stenting of the LCX;  b. 08/2014 MV: EF 20%, large septal, apical, and inferior infarct from apex to base, no ischemia;  c. 08/2014 NSTEMI/Cath: LM mod distal dzs extending into ostial LCX (80%), LAD tortuous, RI nl, OM mod dzs, RCA 100 CTO with R->R and L->R collats-->Med Rx.  . Mixed Ischemic and Nonischemic Cardiomyopathy     a. 8/.2015 Echo: Ef 25-30%;  b. 01/2014 s/p MDT [id number] Talbot Grumbling XT CRT-D (ser # [id number]).  . Chronic systolic congestive heart failure (HCC)     a. 01/2014 Echo: EF 25-30%, mod conc LVH, mod dil LA.  . Bipolar disorder (HCC)   . Anxiety   . Persistent atrial fibrillation (HCC)     a. CHA2DS2VASc = 6->eliquis;  b. S/P AVN RFCA an BiV ICD placement.  . Alcohol abuse   . Complete heart block (HCC)     a. In setting of prior AV nodal ablation r/t afib-->BiV ICD  (01/2014).  . Stroke (HCC)   . CVA (cerebral vascular accident) (HCC)     a. Multiple prior embolic strokes.  . Biventricular automatic implantable cardioverter defibrillator in situ     a. 01/2014 s/p MDT [id number] Viva XT CRT-D (ser # [id number]).   Past Surgical History  Procedure Laterality Date  . Cystoscopy w/ ureteral stent placement  07/12/2011    Procedure: CYSTOSCOPY WITH RETROGRADE PYELOGRAM/URETERAL STENT PLACEMENT;  Surgeon: Sebastian Ache, MD;  Location: [facility] OR;  Service: Urology;  Laterality: Left;  . Hernia repair    . Mitral valve replacement      remote [facility] valve 9629 with redo tissue valve 06/2006  . Pacemaker placement  2006    Changed to CRT-D in 2009  . Av node ablation  2007  . Icd generator change  02/01/2014    Gen change to: Medtronic VIVA pulse generator, serial number [id number]  . Cardiac catheterization  06/2006    Mild ostial L main stenosis, CFX stent patent, RCA occluded (old)  . Coronary artery bypass graft  2008  . Pacemaker generator change N/A 02/01/2014    Procedure: PACEMAKER GENERATOR CHANGE;  Surgeon: Duke Salvia, MD;  Location: [facility] CATH LAB;  Service: Cardiovascular;  Laterality: N/A;  . Left heart catheterization with coronary angiogram N/A  08/23/2014    Procedure: LEFT HEART CATHETERIZATION WITH CORONARY ANGIOGRAM;  Surgeon: Corky Crafts, MD;  Location: [facility] CATH LAB;  Service: Cardiovascular;  Laterality: N/A;   Family History  Problem Relation Age of Onset  . Stroke    . Heart disease    . Alzheimer's disease Mother   . Heart attack Father   . Heart attack Son   . Varicose Veins Son   . Deep vein thrombosis Son    Social History  Substance Use Topics  . Smoking status: Current Every Day Smoker -- 2.00 packs/day    Types: Cigarettes    Last Attempt to Quit: 01/30/2003  . Smokeless tobacco: Never Used  . Alcohol Use: 18.0 oz/week    24 Cans of beer, 6 Standard drinks or equivalent per week     Comment: occasionally     Review of Systems  Constitutional: Negative for fever and chills.  HENT: Negative for rhinorrhea and sore throat.   Eyes: Negative for visual disturbance.  Respiratory: Positive for cough and shortness of breath. Negative for hemoptysis and sputum production.   Cardiovascular: Positive for leg swelling. Negative for chest pain.  Gastrointestinal: Positive for abdominal distention. Negative for nausea, vomiting, abdominal pain, diarrhea and constipation.  Genitourinary: Negative for dysuria and hematuria.  Musculoskeletal: Negative for back pain and neck pain.  Skin: Negative for rash.  Neurological: Negative for syncope and headaches.  Psychiatric/Behavioral: Negative for confusion.  All other systems reviewed and are negative.     Allergies  Warfarin  Home Medications   Prior to Admission medications   Medication Sig Start Date End Date Taking? Authorizing Provider  atorvastatin (LIPITOR) 20 MG tablet TAKE 1 TABLET BY MOUTH EVERY DAY 05/31/14  Yes [name], MD  digoxin (LANOXIN) 0.125 MG tablet Take 1 tablet (0.125 mg total) by mouth daily. 05/22/15  Yes [name], MD  ELIQUIS 2.5 MG TABS tablet Take 2.5 mg by mouth 2 (two) times daily. 05/21/15  Yes Historical Provider, MD  fenofibrate (TRICOR) 48 MG tablet Take 48 mg by mouth daily. 05/21/15  Yes Historical Provider, MD  furosemide (LASIX) 20 MG tablet Take 2 tablets (40 mg total) by mouth 2 (two) times daily. 05/22/15  Yes [name], MD  lisinopril (PRINIVIL,ZESTRIL) 5 MG tablet Take 5 mg by mouth daily.   Yes Historical Provider, MD  nitroGLYCERIN (NITROSTAT) 0.4 MG SL tablet Place 1 tablet (0.4 mg total) under the tongue every 5 (five) minutes as needed for chest pain. 01/06/15  Yes [name], PA-C  spironolactone (ALDACTONE) 25 MG tablet Take 25 mg by mouth daily.   Yes Historical Provider, MD  VENTOLIN HFA 108 (90 BASE) MCG/ACT inhaler Inhale 2 puffs into the lungs every 6 (six) hours as  needed. Reported on 05/22/2015 09/24/14  Yes Historical Provider, MD  isosorbide mononitrate (IMDUR) 30 MG 24 hr tablet Take 1 tablet (30 mg total) by mouth daily. Patient not taking: Reported on 05/22/2015 01/07/15   [name], PA-C   BP 158/95 mmHg  Pulse 107  Temp(Src) 97.6 F (36.4 C) (Oral)  Resp 18  Ht  (1.702 m)  Wt 75.297 kg  BMI 25.99 kg/m2  SpO2 90% Physical Exam  Constitutional: He is oriented to person, place, and time. He appears well-developed and well-nourished. No distress.  HENT:  Head: Normocephalic and atraumatic.  Mouth/Throat: Oropharynx is clear and moist.  Eyes: EOM are normal.  Neck: Neck supple. JVD present.  Cardiovascular: Regular rhythm,  normal heart sounds and intact distal pulses.  Tachycardia present.   Pulmonary/Chest: Accessory muscle usage present. He has rales (diffuse).  Abdominal: Soft. He exhibits distension. There is no tenderness.  Musculoskeletal: Normal range of motion. He exhibits edema (bilateral LE pitting to knees).  Neurological: He is alert and oriented to person, place, and time. No cranial nerve deficit.  Skin: Skin is warm and dry.  Psychiatric: His behavior is normal.    ED Course  Procedures  None   Labs Review Labs Reviewed  BASIC METABOLIC PANEL - Abnormal; Notable for the following:    Glucose, Bld 106 (*)    All other components within normal limits  CBC - Abnormal; Notable for the following:    RDW 17.5 (*)    All other components within normal limits  PROTIME-INR - Abnormal; Notable for the following:    Prothrombin Time 18.8 (*)    INR 1.57 (*)    All other components within normal limits  APTT  BRAIN NATRIURETIC PEPTIDE  BASIC METABOLIC PANEL  TROPONIN I  TROPONIN I  TROPONIN I  MAGNESIUM  I-STAT TROPOININ, ED    Imaging Review Dg Chest 2 View  05/22/2015  CLINICAL DATA:  Shortness of breath today, former smoker, hypertension, CHF, COPD, coronary artery disease, stroke EXAM: CHEST  2  VIEW COMPARISON:  04/03/2015 FINDINGS: LEFT subclavian transvenous pacemaker and AICD leads projecting at RIGHT atrium and RIGHT ventricle. Epicardial pacing leads also present. Enlargement of cardiac silhouette post MVR. Pulmonary vascular congestion. Peribronchial thickening with scattered chronic interstitial changes, stable. Blunting of the LEFT lateral costophrenic angle with calcified pleural plaque formation LEFT chest No acute infiltrate, pleural effusion or pneumothorax. Bones demineralized with multiple old RIGHT rib fractures. IMPRESSION: Enlargement of cardiac silhouette post pacemaker, AICD and MVR. Chronic bronchitic interstitial changes. No acute abnormalities. Electronically Signed   By: Ulyses Southward M.D.   On: 05/22/2015 19:07   I have personally reviewed and evaluated these images and lab results as part of my medical decision-making.   EKG Interpretation   Date/Time:  Wednesday May 22 2015 18:49:46 EST Ventricular Rate:  103 PR Interval:  118 QRS Duration: 162 QT Interval:  422 QTC Calculation: 552 R Axis:   -126 Text Interpretation:  Atrial-sensed ventricular-paced rhythm Biventricular  pacemaker detected Abnormal ECG Paced rhythm new.  Confirmed by [facility] MD,  Barbara Cower [phone]) on 05/22/2015 9:10:35 PM      MDM   Final diagnoses:  Acute pulmonary edema [facility])  Peripheral edema    Patient is a 69 year old male with history of congestive heart failure presents for shortness of breath, bilateral [place] woody swelling and abdominal distention.  Hypoxic to 85% on room air. Placed on 2L [place]. BLE pitting edema, abdominal distention, bilateral rales, JVD. Clinically concerning for CHF exacerbation. Chest x-ray consistent with this. Patient denies chest pain. Troponin negative. BNP 600. Will give IV lasix and admit for CHF exacerbation. He mechanically stable.  Discussed with Dr. Clayborne Dana.   Maris Berger, MD 05/22/15 2325  Marily Memos, MD 05/23/15 [phone]

## 2015-05-22 NOTE — ED Notes (Signed)
Attempted IV x2, unsuccessful. Second RN to [employer]

## 2015-05-22 NOTE — Patient Instructions (Signed)
STOP Carvedilol (Coreg).  STOP Entresto.  STOP Fenofibrate (Tricor).  START Digoxin 0.125 tablet once daily.  INCREASE Lasix to 40mg  twice daily.  INCREASE Eliquis to 5 mg twice daily.  Routine lab work today. Will notify you of abnormal results, otherwise no news is good news!  Follow up next week.  Do the following things EVERYDAY: 1) Weigh yourself in the morning before breakfast. Write it down and keep it in a log. 2) Take your medicines as prescribed 3) Eat low salt foods-Limit salt (sodium) to 2000 mg per day.  4) Stay as active as you can everyday 5) Limit all fluids for the day to less than 2 liters

## 2015-05-23 ENCOUNTER — Ambulatory Visit (HOSPITAL_COMMUNITY): Payer: Commercial Managed Care - HMO

## 2015-05-23 DIAGNOSIS — J438 Other emphysema: Secondary | ICD-10-CM

## 2015-05-23 DIAGNOSIS — I10 Essential (primary) hypertension: Secondary | ICD-10-CM

## 2015-05-23 DIAGNOSIS — I5023 Acute on chronic systolic (congestive) heart failure: Secondary | ICD-10-CM

## 2015-05-23 DIAGNOSIS — I480 Paroxysmal atrial fibrillation: Secondary | ICD-10-CM

## 2015-05-23 LAB — TROPONIN I
TROPONIN I: 0.11 ng/mL — AB (ref <0.031)
TROPONIN I: 0.09 ng/mL — AB (ref <0.031)

## 2015-05-23 LAB — BASIC METABOLIC PANEL
ANION GAP: 7 (ref 5–15)
BUN: 13 mg/dL (ref 6–20)
CALCIUM: 8.8 mg/dL — AB (ref 8.9–10.3)
CHLORIDE: 100 mmol/L — AB (ref 101–111)
CO2: 33 mmol/L — AB (ref 22–32)
Creatinine, Ser: 1.09 mg/dL (ref 0.61–1.24)
GFR calc non Af Amer: >60 mL/min (ref >60)
Glucose, Bld: 100 mg/dL — ABNORMAL HIGH (ref 65–99)
POTASSIUM: 4.2 mmol/L (ref 3.5–5.1)
Sodium: 140 mmol/L (ref 135–145)

## 2015-05-23 LAB — MAGNESIUM: Magnesium: 1.8 mg/dL (ref 1.7–2.4)

## 2015-05-23 LAB — BRAIN NATRIURETIC PEPTIDE: B Natriuretic Peptide: 592.8 pg/mL — ABNORMAL HIGH (ref 0.0–100.0)

## 2015-05-23 MED ORDER — APIXABAN 5 MG PO TABS
5.0000 mg | ORAL_TABLET | Freq: Two times a day (BID) | ORAL | Status: DC
  Administered 2015-05-23 – 2015-05-24 (×2): 5 mg via ORAL
  Filled 2015-05-23 (×2): qty 1

## 2015-05-23 MED ORDER — FUROSEMIDE 10 MG/ML IJ SOLN
60.0000 mg | Freq: Three times a day (TID) | INTRAMUSCULAR | Status: DC
  Administered 2015-05-23 – 2015-05-24 (×3): 60 mg via INTRAVENOUS
  Filled 2015-05-23 (×3): qty 6

## 2015-05-23 NOTE — Progress Notes (Signed)
Nutrition Brief Note  Patient identified on the Malnutrition Screening Tool (MST) Report.  Wt Readings from Last 15 Encounters:  05/23/15 158 lb 11.7 oz (72 kg)  05/22/15 170 lb 4 oz (77.225 kg)  04/03/15 145 lb (65.772 kg)  01/06/15 153 lb 8 oz (69.627 kg)  12/27/14 154 lb 4.8 oz (69.99 kg)  10/12/14 161 lb 12 oz (73.369 kg)  10/05/14 161 lb 6.4 oz (73.211 kg)  09/18/14 157 lb 6.4 oz (71.396 kg)  09/05/14 153 lb (69.4 kg)  08/30/14 155 lb 12.8 oz (70.67 kg)  08/21/14 150 lb 5.7 oz (68.2 kg)  07/31/14 150 lb (68.04 kg)  03/30/14 149 lb 4 oz (67.699 kg)  02/28/14 144 lb 8 oz (65.545 kg)  02/28/14 145 lb (65.772 kg)    Body mass index is 24.85 kg/(m^2). Patient meets criteria for Normal based on current BMI.   Current diet order is 2 gm Sodium, patient is consuming approximately 100% of meals at this time. Labs and medications reviewed.   No nutrition interventions warranted at this time. If nutrition issues arise, please consult RD.   Maureen Chatters, RD, LDN Pager #: [phone] After-Hours Pager #: [phone]

## 2015-05-23 NOTE — Progress Notes (Signed)
Occupational Therapy Evaluation Patient Details Name: [name] MRN: [id number] DOB: 1946/06/01 Today's Date: 05/23/2015    History of Present Illness Pt is a 69 y.o. male with PMH of hypertension, hyperlipidemia, depression, anxiety, DVT, atrial fibrillation on Eliquis, COPD on home oxygen, CAD, systolic congestive heart failure (EF 30%), bipolar disorder, alcohol abuse, tobacco abuse, stroke, complete heart block, s/p of AICD, who presents with SOB.   Clinical Impression   Pt admitted with the above diagnoses and presents with below problem list. Pt will benefit from continued acute OT to address the below listed deficits and maximize independence with BADLs prior to d/c to venue below. PTA pt was independent with ADLs. Pt is currently supervision to min guard with ADLs. OT to continue to follow acutely.      Follow Up Recommendations  Supervision - Intermittent;No OT follow up    Equipment Recommendations  3 in 1 bedside comode    Recommendations for Other Services PT consult     Precautions / Restrictions Restrictions Weight Bearing Restrictions: No      Mobility Bed Mobility               General bed mobility comments: sitting EOB upon therapist arrival  Transfers Overall transfer level: Needs assistance Equipment used: None Transfers: Sit to/from Stand Sit to Stand: Supervision         General transfer comment: from EOB, comfort height toilet, recliner    Balance Overall balance assessment: Needs assistance Sitting-balance support: No upper extremity supported;Feet supported Sitting balance-Leahy Scale: Good     Standing balance support: No upper extremity supported;During functional activity Standing balance-Leahy Scale: Fair Standing balance comment: ambulated with no AD and no LOB, stood to wash hands. Moves with decreased speed.                             ADL Overall ADL's : Needs assistance/impaired Eating/Feeding: Set  up;Sitting   Grooming: Supervision/safety;Standing;Wash/dry hands   Upper Body Bathing: Set up;Sitting   Lower Body Bathing: Supervison/ safety;Sit to/from stand   Upper Body Dressing : Set up;Sitting   Lower Body Dressing: Supervision/safety;Sit to/from stand   Toilet Transfer: Supervision/safety;Ambulation;Comfort height toilet;Grab bars   Toileting- Clothing Manipulation and Hygiene: Supervision/safety   Tub/ Shower Transfer: Tub transfer;Min guard;Ambulation;3 in 1   Functional mobility during ADLs: Supervision/safety General ADL Comments: Completed in-room functional mobility, toilet transfer, grooming task at sink, and simulated stepping over tub wall height. Educated on fall prevention, use of 3n1 as shower seat.      Vision     Perception     Praxis      Pertinent Vitals/Pain Pain Assessment: No/denies pain     Hand Dominance Right   Extremity/Trunk Assessment Upper Extremity Assessment Upper Extremity Assessment: Overall WFL for tasks assessed;Generalized weakness   Lower Extremity Assessment Lower Extremity Assessment: Defer to PT evaluation       Communication Communication Communication: HOH   Cognition Arousal/Alertness: Awake/alert Behavior During Therapy: WFL for tasks assessed/performed Overall Cognitive Status: No family/caregiver present to determine baseline cognitive functioning (tangential at times)                     General Comments       Exercises       Shoulder Instructions      Home Living Family/patient expects to be discharged to:: Private residence Living Arrangements: Children;Other (Comment) (daughter) Available Help at Discharge: Family;Other (Comment) (daughter  is there "most of the time") Type of Home: House Home Access: Level entry     Home Layout: One level     Bathroom Shower/Tub: Tub/shower unit;Curtain Shower/tub characteristics: Engineer, building services: Standard     Home Equipment: Environmental consultant - 2  wheels;Cane - single point;Hand held shower head   Additional Comments: denies having home O2      Prior Functioning/Environment Level of Independence: Independent             OT Diagnosis: Generalized weakness   OT Problem List: Impaired balance (sitting and/or standing);Decreased knowledge of use of DME or AE;Decreased knowledge of precautions;Cardiopulmonary status limiting activity   OT Treatment/Interventions: Self-care/ADL training;Therapeutic exercise;DME and/or AE instruction;Therapeutic activities;Patient/family education;Balance training    OT Goals(Current goals can be found in the care plan section) Acute Rehab OT Goals Patient Stated Goal: not stated OT Goal Formulation: With patient Time For Goal Achievement: 05/30/15 Potential to Achieve Goals: Good ADL Goals Pt Will Perform Lower Body Bathing: with modified independence;sit to/from stand Pt Will Perform Lower Body Dressing: with modified independence;sit to/from stand Pt Will Transfer to Toilet: with modified independence;ambulating (3n1 over toilet) Pt Will Perform Tub/Shower Transfer: Tub transfer;with modified independence;ambulating;3 in 1  OT Frequency: Min 2X/week   Barriers to D/C:            Co-evaluation              End of Session Equipment Utilized During Treatment: Gait belt;Oxygen  Activity Tolerance: Patient tolerated treatment well Patient left: in chair;with call bell/phone within reach   Time: 1540-0867 OT Time Calculation (min): 20 min Charges:  OT General Charges $OT Visit: 1 Procedure OT Evaluation $Initial OT Evaluation Tier I: 1 Procedure G-Codes:    Pilar Grammes 06/01/15, 1:42 PM

## 2015-05-23 NOTE — Progress Notes (Signed)
Utilization review completed.  

## 2015-05-23 NOTE — Care Management Note (Addendum)
Case Management Note Donn Pierini RN, BSN Unit 2W-Case Manager [phone]  Patient Details  Name: [name] MRN: [id number] Date of Birth: [birth year]  Subjective/Objective:  Pt admitted with CHF                   Action/Plan: PTA Pt lived at home has daughter and son, spoke with pt at bedside - per conversation pt states that he plans to return home with daughter upon discharge- discussed home- 02 when pt states that he use to have but has not had in a long time- does not remember who with. Pt gave verbal permission to call daughter [name] to discuss d/c plans and home 02- call made to [name] [phone]) per [name] pt had home 02 with [facility] and she states she would prefer to use again as long as insurance covers. [name] also shared that she is agreeable to have her dad come home with her as long as that is what he is stating that he wants to do- CM shared with her that pt verbalized that was his plan to return home with her. Daughter will come pick pt up when ready for discharge. Have sent txt msg to MD regarding need for Home 02 order- have spoken with Jermaine at [facility] regarding DME need- home 02 will be arranged once order placed- portable tank to be delivered to room prior to discharge.  Daughters home address- [address] [place]  [address]   Expected Discharge Date:     05/24/15             Expected Discharge Plan:  Home/Self Care  In-House Referral:     Discharge planning Services  CM Consult  Post Acute Care Choice:    Choice offered to:     DME Arranged:    DME Agency:     HH Arranged:    HH Agency:     Status of Service:  Completed  Medicare Important Message Given:    Date Medicare IM Given:    Medicare IM give by:    Date Additional Medicare IM Given:    Additional Medicare Important Message give by:     If discussed at Long Length of Stay Meetings, dates discussed:    Discharge Disposition: home/self care   Additional  Comments:  Darrold Span, RN 05/23/2015, 11:10 AM

## 2015-05-23 NOTE — Progress Notes (Signed)
[name] PROGRESS NOTE  [name] EKC:[id number] DOB: 1946-02-20 DOA: 05/22/2015  PCP: Cain Saupe, MD  Brief HPI: 69 year old Caucasian male with a past medical history of hypertension, atrial fibrillation on anticoagulation, COPD on home oxygen, history of DVT, systolic congestive heart failure, AICD placed, presented with shortness of breath. Apparently he had not been taking his medications for reasons that are not entirely clear.  Past medical history:  Past Medical History  Diagnosis Date  . DVT (deep venous thrombosis) (HCC)   . Mitral valve disease     a. remote mitral replacement with Bjork Shiley valve;  b. 06/2006 Redo MVR with tissue valve.  Marland Kitchen COPD (chronic obstructive pulmonary disease) (HCC)   . Depression   . Hypertension   . Dyslipidemia   . Ventral hernia   . CAD (coronary artery disease)     a. s/p prior PCI/stenting of the LCX;  b. 08/2014 MV: EF 20%, large septal, apical, and inferior infarct from apex to base, no ischemia;  c. 08/2014 NSTEMI/Cath: LM mod distal dzs extending into ostial LCX (80%), LAD tortuous, RI nl, OM mod dzs, RCA 100 CTO with R->R and L->R collats-->Med Rx.  . Mixed Ischemic and Nonischemic Cardiomyopathy     a. 8/.2015 Echo: Ef 25-30%;  b. 01/2014 s/p MDT [id number] Talbot Grumbling XT CRT-D (ser # [id number]).  . Chronic systolic congestive heart failure (HCC)     a. 01/2014 Echo: EF 25-30%, mod conc LVH, mod dil LA.  . Bipolar disorder (HCC)   . Anxiety   . Persistent atrial fibrillation (HCC)     a. CHA2DS2VASc = 6->eliquis;  b. S/P AVN RFCA an BiV ICD placement.  . Alcohol abuse   . Complete heart block (HCC)     a. In setting of prior AV nodal ablation r/t afib-->BiV ICD (01/2014).  . Stroke (HCC)   . CVA (cerebral vascular accident) (HCC)     a. Multiple prior embolic strokes.  . Biventricular automatic implantable cardioverter defibrillator in situ     a. 01/2014 s/p MDT [id number] Viva XT CRT-D (ser # [id number]).    Consultants:  None  Procedures:  Echocardiogram is pending  Antibiotics: None  Subjective: Patient feels well this morning. Denies any chest pain. Breathing is improving. He does have lower extremity. No nausea, vomiting.  Objective: Vital Signs  Filed Vitals:   05/23/15 0533 05/23/15 1050 05/23/15 1052 05/23/15 1400  BP: 119/66 139/75  128/85  Pulse: 58 94 60 59  Temp: 97.7 F (36.5 C) 97.3 F (36.3 C)  97.6 F (36.4 C)  TempSrc: Oral   Oral  Resp: 20   20  Height:      Weight: 72 kg (158 lb 11.7 oz)     SpO2: 95%   97%    Intake/Output Summary (Last 24 hours) at 05/23/15 1433 Last data filed at 05/23/15 1402  Gross per 24 hour  Intake    720 ml  Output   1225 ml  Net   -505 ml   Filed Weights   05/22/15 1846 05/22/15 2159 05/23/15 0533  Weight: 75.297 kg (166 lb) 72.4 kg (159 lb 9.8 oz) 72 kg (158 lb 11.7 oz)    General appearance: alert, cooperative, appears stated age and no distress Resp: Crackles present, bilateral bases. No wheezing, rhonchi. Cardio: S1, S2 is irregularly irregular. No S3, S4 GI: soft, non-tender; bowel sounds normal; no masses,  no organomegaly Extremities: 1. 2+ pitting edema in bilateral lower extremities Neurologic: No focal deficits  identified  Lab Results:  Basic Metabolic Panel:  Recent Labs Lab 05/22/15 1645 05/22/15 1916 05/23/15 0341  NA 141 140 140  K 4.2 4.2 4.2  CL 103 101 100*  CO2 29 30 33*  GLUCOSE 59* 106* 100*  BUN CREATININE 1.05 1.16 1.09  CALCIUM 9.0 9.1 8.8*  MG  --   --  1.8   CBC:  Recent Labs Lab 05/22/15 1645 05/22/15 1916  WBC 9.7 10.0  HGB 13.7 14.0  HCT 44.8 44.8  MCV 91.6 91.2  PLT 189 205   Cardiac Enzymes:  Recent Labs Lab 05/22/15 2257 05/23/15 0341 05/23/15 1005  TROPONINI 0.09* 0.11* 0.09*   BNP (last 3 results)  Recent Labs  01/27/15 2332 05/22/15 1645 05/22/15 2107  BNP 484.8* 604.2* 592.8*    Studies/Results: Dg Chest 2 View  05/22/2015  CLINICAL DATA:   Shortness of breath today, former smoker, hypertension, CHF, COPD, coronary artery disease, stroke EXAM: CHEST  2 VIEW COMPARISON:  04/03/2015 FINDINGS: LEFT subclavian transvenous pacemaker and AICD leads projecting at RIGHT atrium and RIGHT ventricle. Epicardial pacing leads also present. Enlargement of cardiac silhouette post MVR. Pulmonary vascular congestion. Peribronchial thickening with scattered chronic interstitial changes, stable. Blunting of the LEFT lateral costophrenic angle with calcified pleural plaque formation LEFT chest No acute infiltrate, pleural effusion or pneumothorax. Bones demineralized with multiple old RIGHT rib fractures. IMPRESSION: Enlargement of cardiac silhouette post pacemaker, AICD and MVR. Chronic bronchitic interstitial changes. No acute abnormalities. Electronically Signed   By: Ulyses Southward M.D.   On: 05/22/2015 19:07    Medications:  Scheduled: . apixaban  5 mg Oral BID  . atorvastatin  20 mg Oral q1800  . digoxin  0.125 mg Oral Daily  . fenofibrate  54 mg Oral Daily  . folic acid  1 mg Oral Daily  . furosemide  60 mg Intravenous 3 times per day  . lisinopril  5 mg Oral Daily  . LORazepam  0-4 mg Intravenous Q6H   Followed by  . [START ON 05/24/2015] LORazepam  0-4 mg Intravenous Q12H  . multivitamin with minerals  1 tablet Oral Daily  . nicotine  21 mg Transdermal Daily  . sodium chloride  3 mL Intravenous Q12H  . spironolactone  25 mg Oral Daily  . thiamine  100 mg Oral Daily   Continuous:  BJY:NWGNFA chloride, acetaminophen, albuterol, LORazepam **OR** LORazepam, nitroGLYCERIN, ondansetron (ZOFRAN) IV, sodium chloride  Assessment/Plan:  Principal Problem:   Acute on chronic systolic congestive heart failure (HCC) Active Problems:   Atrial fibrillation (HCC)   CAD (coronary artery disease)   Right atrial thrombus (HCC)   Atrial fibrillation (HCC)   Complete heart block (HCC)   Severe chronic obstructive pulmonary disease (HCC)    Hyperlipidemia   Automatic implantable cardioverter-defibrillator in situ   Hypertension   PVD (peripheral vascular disease) (HCC)   Chronic anticoagulation   COPD (chronic obstructive pulmonary disease) (HCC)   Depression   Alcohol abuse   Stroke (HCC)   CHF exacerbation (HCC)    Acute on chronic systolic congestive heart failure Patient has been started on IV Lasix. He does feel better today. Continues to have significant lower extremity edema. Will increase the dose of Lasix today. Continue ACE inhibitor. Strict ins and outs and daily weight. Patient has weighed as low as 65.7 kg in the last few months. His dry weight is not entirely clear. Await echocardiogram to see this any changes to his systolic function.  History  of coronary artery disease He is status post CABG. Denies any chest pain currently. Minimally elevated troponin, likely secondary to CHF. Continue medical treatment.  History of atrial fibrillation CHA2DS2-VASc Score is 6. Continue Eliquis. Heart rate is reasonably well controlled.  History of DVT Continue anticoagulation  History of COPD Stable.  History of essential hypertension Blood pressure is reasonably well controlled. Continue his antihypertensive regimen.  History of tobacco and alcohol abuse He has been extensively counseled. Continue CIWA protocol. Thiamine.  DVT Prophylaxis: On anticoagulation    Code Status: Full code  Family Communication: No family at bedside  Disposition Plan: Continue current management as outlined above. PT and OT to evaluate. Involve Child psychotherapist, as patient may need a home assessment as he has been noncompliant with his medication.     LOS: 1 day   [facility]  [name] Pager [phone] 05/23/2015, 2:33 PM  If 7PM-7AM, please contact night-coverage at [email or web address], password [email or web address]

## 2015-05-23 NOTE — Progress Notes (Signed)
I stopped by to see [name].  He is known to me from the [facility].  He was seen yesterday in the clinic.  The daughter says that she did get new medication prescriptions filled however he did not have the opportunity to take any of the new doses before coming back to the hospital.  His daughter says that there was a dispute between her and the patient's son in reference to where the patient would stay going forward.  The daughter tells me that her brother threatened to "send her to jail and now has papers" on her father.  She brought him to the hospital to avoid the "stress" of arguing with her brother.  I have given this information to the Care Manager for the department and she will relay to SW if necessary.  Dr. Shirlee Latch will see the patient in the AM.

## 2015-05-23 NOTE — Plan of Care (Signed)
Problem: Activity: Goal: Capacity to carry out activities will improve Outcome: Progressing Pt able to ambulate with staff as stand by assist. Tolerating well.  Problem: Pain Managment: Goal: General experience of comfort will improve Outcome: Progressing Pt denies any pain issues this shift.  Problem: Fluid Volume: Goal: Ability to maintain a balanced intake and output will improve Outcome: Progressing Pt's urine output so far this shift is only with fluid intake of . Continue to monitor.

## 2015-05-23 NOTE — Evaluation (Signed)
Physical Therapy Evaluation Patient Details Name: [name] MRN: [id number] DOB: Feb 04, 1946 Today's Date: 05/23/2015   History of Present Illness  Pt is a 69 y.o. male with PMH of hypertension, hyperlipidemia, depression, anxiety, DVT, atrial fibrillation on Eliquis, COPD on home oxygen, CAD, systolic congestive heart failure (EF 30%), bipolar disorder, alcohol abuse, tobacco abuse, stroke, complete heart block, s/p of AICD, who presents with SOB.  Clinical Impression  Pt admitted with/for SOB.  Pt currently limited functionally due to the problems listed below.  (see problems list.)  Pt will benefit from PT to maximize function and safety to be able to get home safely with available assist of family.     Follow Up Recommendations No PT follow up;Other (comment) (up to 24 hour assist intially)    Equipment Recommendations  None recommended by PT    Recommendations for Other Services       Precautions / Restrictions Precautions Precautions: None;Other (comment) Restrictions Weight Bearing Restrictions: No      Mobility  Bed Mobility Overal bed mobility: Independent             General bed mobility comments: sitting EOB upon therapist arrival  Transfers Overall transfer level: Needs assistance Equipment used: None Transfers: Sit to/from Stand Sit to Stand: Supervision         General transfer comment: from EOB, comfort height toilet, recliner  Ambulation/Gait Ambulation/Gait assistance: Supervision Ambulation Distance (Feet): 500 Feet Assistive device: None Gait Pattern/deviations: Step-through pattern   Gait velocity interpretation: at or above normal speed for age/gender General Gait Details: steady even with higher level task, including stepping over objects, scanning, abrupt turns, increasing speed.  Stairs Stairs: Yes   Stair Management: One rail Right;No rails;Alternating pattern;Forwards Number of Stairs: 5 General stair comments: safe with  rails  Wheelchair Mobility    Modified Rankin (Stroke Patients Only)       Balance Overall balance assessment: Needs assistance Sitting-balance support: No upper extremity supported Sitting balance-Leahy Scale: Good     Standing balance support: No upper extremity supported;During functional activity Standing balance-Leahy Scale: Good Standing balance comment: ambulated with no AD and no LOB, stood to wash hands. Moves with decreased speed.              High level balance activites: Direction changes;Turns;Sudden stops;Head turns (stepping over objects) High Level Balance Comments: no overt LOB or deviation             Pertinent Vitals/Pain Pain Assessment: No/denies pain    Home Living Family/patient expects to be discharged to:: Private residence Living Arrangements: Children;Other (Comment) (daughter) Available Help at Discharge: Family;Other (Comment) (daughter is there "most of the time") Type of Home: House Home Access: Level entry     Home Layout: One level Home Equipment: Walker - 2 wheels;Cane - single point;Hand held shower head Additional Comments: denies having home O2 presently, had it once for a short time.    Prior Function Level of Independence: Independent               Hand Dominance   Dominant Hand: Right    Extremity/Trunk Assessment   Upper Extremity Assessment: Defer to OT evaluation           Lower Extremity Assessment: Overall WFL for tasks assessed (some proximal weakness)         Communication   Communication: HOH  Cognition Arousal/Alertness: Awake/alert Behavior During Therapy: WFL for tasks assessed/performed Overall Cognitive Status: No family/caregiver present to determine baseline cognitive functioning (  tangential at times)                      General Comments General comments (skin integrity, edema, etc.): At rest on RA, sats 95%, HR 99 bpm;  ambulating on RA  92% EHR 102 bpm; 2nd check with  ambulation on RA, sats 89/90 and #HR 107 bpm , Ambulation and going to BR sats 90/91.  Replacement of O2.    Exercises        Assessment/Plan    PT Assessment Patient needs continued PT services  PT Diagnosis Generalized weakness;Other (comment) (decreased activity tolerance)   PT Problem List Decreased strength;Decreased activity tolerance;Cardiopulmonary status limiting activity  PT Treatment Interventions Gait training;Stair training;Functional mobility training;Therapeutic activities;Patient/family education   PT Goals (Current goals can be found in the Care Plan section) Acute Rehab PT Goals Patient Stated Goal: not stated PT Goal Formulation: With patient Time For Goal Achievement: 05/30/15 Potential to Achieve Goals: Good    Frequency Min 3X/week   Barriers to discharge        Co-evaluation               End of Session   Activity Tolerance: Patient tolerated treatment well;Patient limited by fatigue Patient left: in bed;with call bell/phone within reach;with family/visitor present Nurse Communication: Mobility status;Other (comment) (Oxygen requirments)         Time: 1610-9604 PT Time Calculation (min) (ACUTE ONLY): 17 min   Charges:   PT Evaluation $Initial PT Evaluation Tier I: 1 Procedure     PT G Codes:        Laquinn Shippy, Eliseo Gum 05/23/2015, 4:59 PM  05/23/2015  Lawn Bing, PT [phone]  (pager)

## 2015-05-23 NOTE — Progress Notes (Signed)
  Echocardiogram 2D Echocardiogram has been performed.  Leta Jungling M 05/23/2015, 12:41 PM

## 2015-05-24 DIAGNOSIS — I482 Chronic atrial fibrillation: Secondary | ICD-10-CM

## 2015-05-24 LAB — BASIC METABOLIC PANEL
Anion gap: 8 (ref 5–15)
BUN: 16 mg/dL (ref 6–20)
CALCIUM: 9.1 mg/dL (ref 8.9–10.3)
CHLORIDE: 91 mmol/L — AB (ref 101–111)
CO2: 38 mmol/L — AB (ref 22–32)
CREATININE: 1.37 mg/dL — AB (ref 0.61–1.24)
GFR calc non Af Amer: 52 mL/min — ABNORMAL LOW (ref >60)
GLUCOSE: 108 mg/dL — AB (ref 65–99)
Potassium: 4.4 mmol/L (ref 3.5–5.1)
Sodium: 137 mmol/L (ref 135–145)

## 2015-05-24 LAB — CBC
HCT: 44 % (ref 39.0–52.0)
Hemoglobin: 13.6 g/dL (ref 13.0–17.0)
MCH: 28.4 pg (ref 26.0–34.0)
MCHC: 30.9 g/dL (ref 30.0–36.0)
MCV: 91.9 fL (ref 78.0–100.0)
PLATELETS: 206 10*3/uL (ref 150–400)
RBC: 4.79 MIL/uL (ref 4.22–5.81)
RDW: 17.1 % — ABNORMAL HIGH (ref 11.5–15.5)
WBC: 10.9 10*3/uL — ABNORMAL HIGH (ref 4.0–10.5)

## 2015-05-24 MED ORDER — FUROSEMIDE 40 MG PO TABS: 40.0000 mg | ORAL_TABLET | Freq: Two times a day (BID) | ORAL | Status: DC

## 2015-05-24 MED ORDER — THIAMINE HCL 100 MG PO TABS: 100.0000 mg | ORAL_TABLET | Freq: Every day | ORAL | Status: DC

## 2015-05-24 MED ORDER — CARVEDILOL 3.125 MG PO TABS: 3.1250 mg | ORAL_TABLET | Freq: Two times a day (BID) | ORAL | Status: DC

## 2015-05-24 MED ORDER — APIXABAN 5 MG PO TABS: 5.0000 mg | ORAL_TABLET | Freq: Two times a day (BID) | ORAL | Status: DC

## 2015-05-24 MED ORDER — FUROSEMIDE 20 MG PO TABS: 40.0000 mg | ORAL_TABLET | Freq: Two times a day (BID) | ORAL | Status: DC

## 2015-05-24 MED ORDER — ADULT MULTIVITAMIN W/MINERALS CH: 1.0000 | ORAL_TABLET | Freq: Every day | ORAL | Status: DC

## 2015-05-24 MED ORDER — CARVEDILOL 3.125 MG PO TABS
3.1250 mg | ORAL_TABLET | Freq: Two times a day (BID) | ORAL | Status: DC
  Administered 2015-05-24: 3.125 mg via ORAL
  Filled 2015-05-24: qty 1

## 2015-05-24 NOTE — Discharge Summary (Signed)
[name]  Physician Discharge Summary   Patient ID: [name] MRN: [id number] DOB/AGE: [birth year] 69 y.o.  Admit date: 05/22/2015 Discharge date: 05/24/2015  PCP: Cain Saupe, MD  DISCHARGE DIAGNOSES:  Principal Problem:   Acute on chronic systolic congestive heart failure (HCC) Active Problems:   Atrial fibrillation (HCC)   CAD (coronary artery disease)   Right atrial thrombus (HCC)   Atrial fibrillation (HCC)   Complete heart block (HCC)   Severe chronic obstructive pulmonary disease (HCC)   Hyperlipidemia   Automatic implantable cardioverter-defibrillator in situ   Hypertension   PVD (peripheral vascular disease) (HCC)   Chronic anticoagulation   COPD (chronic obstructive pulmonary disease) (HCC)   Depression   Alcohol abuse   Stroke (HCC)   CHF exacerbation (HCC)   RECOMMENDATIONS FOR OUTPATIENT FOLLOW UP: 1. Patient has a close follow-up appointment with heart failure clinic 2. Patient's daughter concerned about his hearing. He may benefit from a referral to ENT. 3. Home oxygen will be ordered   DISCHARGE CONDITION: fair  Diet recommendation: Heart healthy  Filed Weights   05/22/15 2159 05/23/15 0533 05/24/15 0539  Weight: 72.4 kg (159 lb 9.8 oz) 72 kg (158 lb 11.7 oz) 71.3 kg (157 lb 3 oz)    INITIAL HISTORY: 69 year old Caucasian male with a past medical history of hypertension, atrial fibrillation on anticoagulation, COPD on home oxygen, history of DVT, systolic congestive heart failure, AICD placed, presented with shortness of breath. Apparently he had not been taking his medications for reasons that are not entirely clear  Consultations:  Cardiology  Procedures: Echocardiogram Study Conclusions - Left ventricle: The cavity size was moderately dilated. Systolicfunction was severely reduced. The estimated ejection fractionwas in the range of 20% to 25%. Akinesis of the inferolateralmyocardium. - Aortic valve: There was mild  regurgitation. - Mitral valve: A bioprosthesis was present and functioningnormally. Valve area by pressure half-time: 1.63 cm^2. Valve areaby continuity equation (using LVOT flow): 0.7 cm^2. - Left atrium: The atrium was severely dilated. - Right atrium: The atrium was mildly dilated. - Tricuspid valve: There was mild-moderate regurgitation. - Pulmonary arteries: Systolic pressure was moderately increased.PA peak pressure: 47 mm Hg (S).  HOSPITAL COURSE:   Acute on chronic systolic congestive heart failure Patient was admitted to the hospital and started on IV Lasix. He did diurese. His weight decreased. Patient was seen by the heart failure team. Carvedilol has been added to his medication regimen. His lower extremity edema has resolved. He has a close follow-up appointment next week. He is considered to be stable for discharge. Slight elevation in his creatinine is noted, likely due to diuresis. This can be followed up at the outpatient appointment. Continue ACE inhibitor. Patient has weighed as low as 65.7 kg in the last few months. His dry weight is not entirely clear. Echocardiogram. Discussed decrease in his ejection fraction. It is quite possible that over the last few months he may not have been compliant with his home medications. Patient is now going to live with his daughter.  History of coronary artery disease He is status post CABG. Denies any chest pain currently. Minimally elevated troponin, likely secondary to CHF. Continue medical treatment.  History of atrial fibrillation CHA2DS2-VASc Score is 6. Continue Eliquis. Dose has been adjusted. Heart rate is reasonably well controlled.  History of DVT Continue anticoagulation  History of COPD Stable.  History of essential hypertension Blood pressure is reasonably well controlled. Continue his antihypertensive regimen.  History of tobacco and alcohol abuse He has  been extensively counseled. Thiamine.  Discussed in detail  with his daughter. She confirmed that she'll be taking the patient home at discharge. Overall stable. His oxygen saturations did drop with ambulation. Home oxygen will be arranged. He may also benefit from outpatient sleep study, which can be considered at his follow-up appointment.  Cleared by cardiology and okay for discharge today.    PERTINENT LABS:  The results of significant diagnostics from this hospitalization (including imaging, microbiology, ancillary and laboratory) are listed below for reference.     Labs: Basic Metabolic Panel:  Recent Labs Lab 05/22/15 1645 05/22/15 1916 05/23/15 0341 05/24/15 0310  NA 141 140 140 137  K 4.2 4.2 4.2 4.4  CL 103 101 100* 91*  CO2 29 30 33* 38*  GLUCOSE 59* 106* 100* 108*  BUN CREATININE 1.05 1.16 1.09 1.37*  CALCIUM 9.0 9.1 8.8* 9.1  MG  --   --  1.8  --    CBC:  Recent Labs Lab 05/22/15 1645 05/22/15 1916 05/24/15 0310  WBC 9.7 10.0 10.9*  HGB 13.7 14.0 13.6  HCT 44.8 44.8 44.0  MCV 91.6 91.2 91.9  PLT 189 205 206   Cardiac Enzymes:  Recent Labs Lab 05/22/15 2257 05/23/15 0341 05/23/15 1005  TROPONINI 0.09* 0.11* 0.09*   BNP: BNP (last 3 results)  Recent Labs  01/27/15 2332 05/22/15 1645 05/22/15 2107  BNP 484.8* 604.2* 592.8*    IMAGING STUDIES Dg Chest 2 View  05/22/2015  CLINICAL DATA:  Shortness of breath today, former smoker, hypertension, CHF, COPD, coronary artery disease, stroke EXAM: CHEST  2 VIEW COMPARISON:  04/03/2015 FINDINGS: LEFT subclavian transvenous pacemaker and AICD leads projecting at RIGHT atrium and RIGHT ventricle. Epicardial pacing leads also present. Enlargement of cardiac silhouette post MVR. Pulmonary vascular congestion. Peribronchial thickening with scattered chronic interstitial changes, stable. Blunting of the LEFT lateral costophrenic angle with calcified pleural plaque formation LEFT chest No acute infiltrate, pleural effusion or pneumothorax. Bones  demineralized with multiple old RIGHT rib fractures. IMPRESSION: Enlargement of cardiac silhouette post pacemaker, AICD and MVR. Chronic bronchitic interstitial changes. No acute abnormalities. Electronically Signed   By: Ulyses Southward M.D.   On: 05/22/2015 19:07    DISCHARGE EXAMINATION: Filed Vitals:   05/23/15 2000 05/24/15 0539 05/24/15 1004 05/24/15 1357  BP: 125/73 127/75 104/63 112/77  Pulse: 69 107  76  Temp: 98.1 F (36.7 C) 98 F (36.7 C)  97.8 F (36.6 C)  TempSrc: Oral Oral  Oral  Resp: Height:      Weight:  71.3 kg (157 lb 3 oz)    SpO2: 96% 96% 99% 100%   General appearance: alert, cooperative, appears stated age and no distress Resp: clear to auscultation bilaterally Cardio: regular rate and rhythm, S1, S2 normal, no murmur, click, rub or gallop GI: soft, non-tender; bowel sounds normal; no masses,  no organomegaly Extremities: Improved edema bilateral lower extremities   DISPOSITION: Home  Discharge Instructions    (HEART FAILURE PATIENTS) Call MD:  Anytime you have any of the following symptoms: 1) 3 pound weight gain in 24 hours or 5 pounds in 1 week 2) shortness of breath, with or without a dry hacking cough 3) swelling in the hands, feet or stomach 4) if you have to sleep on extra pillows at night in order to breathe.    Complete by:  As directed      Call MD for:  difficulty breathing, headache or  visual disturbances    Complete by:  As directed      Call MD for:  extreme fatigue    Complete by:  As directed      Call MD for:  hives    Complete by:  As directed      Call MD for:  persistant dizziness or light-headedness    Complete by:  As directed      Call MD for:  persistant nausea and vomiting    Complete by:  As directed      Call MD for:  severe uncontrolled pain    Complete by:  As directed      Call MD for:  temperature >100.4    Complete by:  As directed      Diet - low sodium heart healthy    Complete by:  As directed       Discharge instructions    Complete by:  As directed   Follow up with heart failure clinic on Dec 20th as scheduled. Please talk to your PCP regarding hearing impairment. Home oxygen will be arranged.  You were cared for by a hospitalist during your hospital stay. If you have any questions about your discharge medications or the care you received while you were in the hospital after you are discharged, you can call the unit and asked to speak with the hospitalist on call if the hospitalist that took care of you is not available. Once you are discharged, your primary care physician will handle any further medical issues. Please note that NO REFILLS for any discharge medications will be authorized once you are discharged, as it is imperative that you return to your primary care physician (or establish a relationship with a primary care physician if you do not have one) for your aftercare needs so that they can reassess your need for medications and monitor your lab values. If you do not have a primary care physician, you can call [phone] for a physician referral.     Increase activity slowly    Complete by:  As directed            ALLERGIES:  Allergies  Allergen Reactions  . Warfarin Other (See Comments)    Non compliance and ETOH abuse     Current Discharge Medication List    START taking these medications   Details  carvedilol (COREG) 3.125 MG tablet Take 1 tablet (3.125 mg total) by mouth 2 (two) times daily with a meal. Qty: 60 tablet, Refills: 1    Multiple Vitamin (MULTIVITAMIN WITH MINERALS) TABS tablet Take 1 tablet by mouth daily. Qty: 30 tablet, Refills: 2    thiamine 100 MG tablet Take 1 tablet (100 mg total) by mouth daily. Qty: 30 tablet, Refills: 0      CONTINUE these medications which have CHANGED   Details  apixaban (ELIQUIS) 5 MG TABS tablet Take 1 tablet (5 mg total) by mouth 2 (two) times daily. Qty: 60 tablet, Refills: 1    furosemide (LASIX) 20 MG tablet Take  2 tablets (40 mg total) by mouth 2 (two) times daily. Qty: 120 tablet, Refills: 6      CONTINUE these medications which have NOT CHANGED   Details  atorvastatin (LIPITOR) 20 MG tablet TAKE 1 TABLET BY MOUTH EVERY DAY Qty: 30 tablet, Refills: 3    digoxin (LANOXIN) 0.125 MG tablet Take 1 tablet (0.125 mg total) by mouth daily. Qty: 90 tablet, Refills: 3    fenofibrate (TRICOR) 48 MG  tablet Take 48 mg by mouth daily.    lisinopril (PRINIVIL,ZESTRIL) 5 MG tablet Take 5 mg by mouth daily.    nitroGLYCERIN (NITROSTAT) 0.4 MG SL tablet Place 1 tablet (0.4 mg total) under the tongue every 5 (five) minutes as needed for chest pain. Qty: 25 tablet, Refills: 2    spironolactone (ALDACTONE) 25 MG tablet Take 25 mg by mouth daily.    VENTOLIN HFA 108 (90 BASE) MCG/ACT inhaler Inhale 2 puffs into the lungs every 6 (six) hours as needed. Reported on 05/22/2015      STOP taking these medications     isosorbide mononitrate (IMDUR) 30 MG 24 hr tablet        Follow-up Information    Follow up with Marca Ancona, MD.   Specialty:  Cardiology   Why:  your appointment is on Dec 20th at 3:30pm at the heart failure clinic. please call to get directions.   Contact information:   [address] [place] [address] [phone]       Follow up with FULP, CAMMIE, MD. Schedule an appointment as soon as possible for a visit in 1 week.   Specialty:  Family Medicine   Why:  post hospitalization follow up and to discuss hearing impairment.   Contact information:   [address] [place] [address] [phone]       Follow up with [facility].   Why:  Home 02- arranged- portable tank to be delivered to room prior to discharge   Contact information:   [address] [place] [address] [phone]       TOTAL DISCHARGE TIME: 35 minutes  [facility]  [name] Pager [phone]  05/24/2015, 2:14 PM

## 2015-05-24 NOTE — Progress Notes (Signed)
SATURATION QUALIFICATIONS: (This note is used to comply with regulatory documentation for home oxygen)  Patient Saturations on Room Air at Rest = 96%  Patient Saturations on Room Air while Ambulating = 86%  Patient Saturations on 2 Liters of oxygen while Ambulating = 92%  Please briefly explain why patient needs home oxygen: [employer] desaturation

## 2015-05-24 NOTE — Consult Note (Signed)
Advanced Heart Failure Team Consult Note  Referring Physician: Dr Rito Ehrlich Primary Physician: Dr Jillyn Hidden  Primary Cardiologist:  Dr Shirlee Latch   Reason for Consultation:   HPI:    Mr Bohnenkamp is a 69 yo with history of chronic atrial fibrillation with AV nodal ablation and BiV pacing, mixed ischemic/nonischemic cardiomyopathy, chronic ETOH abuse, MV replacement x 2, and CAD. He was hospitalized with acute on chronic systolic CHF in 9/15. He had not been taking any medications. CTA chest negative for PE. He was diuresed and started back on cardiac meds. He was discharged to live with his daughter. He has quit drinking since living with his daughter. He was admitted again in 3/16 with NSTEMI, CHF. LHC showed moderate distal LM disease (6.9 mm2 by IVUS), 80% ostial LCx stenosis, and total occlusion RCA with collaterals. Ostial LCx was poor target for intervention, he was managed medically.   Most recent ECHO was EF 30-35%, inferior/inferolateral akinesis, bioprosthetic mitral valve looked ok, PASP 55 mmHg, moderate-severe LAE.   He was seen in the HF clinic 05/22/2015  and had been out of his medications for the last 5 months. Apparently drinking lots of alcohol. . He was restarted on lasix, spironolactone, lisinopril, digoxin, apixaban, and atorvastatin. Unfortunately he never started the meds after the appointment.  Weight at the time was 170 pounds. During the visit, social issues seemed to be contributing to medication noncompliance.   Presented [facility] ED with increased dyspnea and later admitted A/C systolic heart failure. He started on IV lasix.  Admission labs were stable and BNP was 592.  CXR was stable. ECHO was repeated and showed EF 20-25%.  Weight has been coming down from admit.  Overall he is feeling better. Denies SOB. Plans to live with daughter when he is discharged.    Review of Systems: [y] = yes,  = no   General: Weight gain ; Weight loss ; Anorexia ; Fatigue [Y ];  Fever ; Chills ; Weakness   Cardiac: Chest pain/pressure ; Resting SOB ; Exertional SOB [ Y]; Orthopnea ; Pedal Edema [Y ]; Palpitations ; Syncope ; Presyncope ; Paroxysmal nocturnal dyspnea[ ]   Pulmonary: Cough ; Wheezing[ ] ; Hemoptysis[ ] ; Sputum ; Snoring   GI: Vomiting[ ] ; Dysphagia[ ] ; Melena[ ] ; Hematochezia ; Heartburn[ ] ; Abdominal pain ; Constipation ; Diarrhea ; BRBPR   GU: Hematuria[ ] ; Dysuria ; Nocturia[ ]   Vascular: Pain in legs with walking ; Pain in feet with lying flat ; Non-healing sores ; Stroke ; TIA ; Slurred speech ;  Neuro: Headaches[ ] ; Vertigo[ ] ; Seizures[ ] ; Paresthesias[ ] ;Blurred vision ; Diplopia ; Vision changes   Ortho/Skin: Arthritis ; Joint pain ; Muscle pain ; Joint swelling ; Back Pain ; Rash   Psych: Depression[ ] ; Anxiety[ ]   Heme: Bleeding problems ; Clotting disorders ; Anemia   Endocrine: Diabetes ; Thyroid dysfunction[ ]   Home Medications Prior to Admission medications   Medication Sig Start Date End Date Taking? Authorizing Provider  atorvastatin (LIPITOR) 20 MG tablet TAKE 1 TABLET BY MOUTH EVERY DAY 05/31/14  Yes [name], MD  digoxin (LANOXIN) 0.125 MG tablet Take 1 tablet (0.125 mg total) by mouth daily. 05/22/15  Yes [name], MD  ELIQUIS 2.5 MG TABS tablet Take 2.5  mg by mouth 2 (two) times daily. 05/21/15  Yes Historical Provider, MD  fenofibrate (TRICOR) 48 MG tablet Take 48 mg by mouth daily. 05/21/15  Yes Historical Provider, MD  furosemide (LASIX) 20 MG tablet Take 2 tablets (40 mg total) by mouth 2 (two) times daily. 05/22/15  Yes [name], MD  lisinopril (PRINIVIL,ZESTRIL) 5 MG tablet Take 5 mg by mouth daily.   Yes Historical Provider, MD  nitroGLYCERIN (NITROSTAT) 0.4 MG SL tablet Place 1 tablet (0.4 mg total) under the tongue every 5 (five) minutes as needed for chest pain. 01/06/15  Yes [name], PA-C  spironolactone (ALDACTONE) 25 MG tablet Take 25 mg by mouth daily.   Yes Historical Provider, MD  VENTOLIN HFA 108 (90 BASE) MCG/ACT inhaler Inhale 2 puffs into the lungs every 6 (six) hours as needed. Reported on 05/22/2015 09/24/14  Yes Historical Provider, MD  isosorbide mononitrate (IMDUR) 30 MG 24 hr tablet Take 1 tablet (30 mg total) by mouth daily. Patient not taking: Reported on 05/22/2015 01/07/15   Deborha Payment    Past Medical History: Past Medical History  Diagnosis Date  . DVT (deep venous thrombosis) (HCC)   . Mitral valve disease     a. remote mitral replacement with Bjork Shiley valve;  b. 06/2006 Redo MVR with tissue valve.  Marland Kitchen COPD (chronic obstructive pulmonary disease) (HCC)   . Depression   . Hypertension   . Dyslipidemia   . Ventral hernia   . CAD (coronary artery disease)     a. s/p prior PCI/stenting of the LCX;  b. 08/2014 MV: EF 20%, large septal, apical, and inferior infarct from apex to base, no ischemia;  c. 08/2014 NSTEMI/Cath: LM mod distal dzs extending into ostial LCX (80%), LAD tortuous, RI nl, OM mod dzs, RCA 100 CTO with R->R and L->R collats-->Med Rx.  . Mixed Ischemic and Nonischemic Cardiomyopathy     a. 8/.2015 Echo: Ef 25-30%;  b. 01/2014 s/p MDT [id number] [place] Grumbling XT CRT-D (ser # [id number]).  . Chronic systolic congestive heart failure (HCC)     a. 01/2014 Echo: EF 25-30%, mod conc LVH, mod dil LA.  . Bipolar disorder (HCC)   . Anxiety   . Persistent atrial fibrillation (HCC)     a. CHA2DS2VASc = 6->eliquis;  b. S/P AVN RFCA an BiV ICD placement.  . Alcohol abuse   . Complete heart block (HCC)     a. In setting of prior AV nodal ablation r/t afib-->BiV ICD (01/2014).  . Stroke (HCC)   . CVA (cerebral vascular accident) (HCC)     a. Multiple prior embolic strokes.  . Biventricular automatic implantable cardioverter defibrillator in situ     a. 01/2014 s/p MDT [id number] Viva XT CRT-D (ser # [id number]).    Past Surgical History: Past  Surgical History  Procedure Laterality Date  . Cystoscopy w/ ureteral stent placement  07/12/2011    Procedure: CYSTOSCOPY WITH RETROGRADE PYELOGRAM/URETERAL STENT PLACEMENT;  Surgeon: Sebastian Ache, MD;  Location: [facility] OR;  Service: Urology;  Laterality: Left;  . Hernia repair    . Mitral valve replacement      remote [facility] valve [id number] with redo tissue valve 06/2006  . Pacemaker placement  2006    Changed to CRT-D in 2009  . Av node ablation  2007  . Icd generator change  02/01/2014    Gen change to: Medtronic VIVA pulse generator, serial number [id number]  . Cardiac catheterization  06/2006  Mild ostial L main stenosis, CFX stent patent, RCA occluded (old)  . Coronary artery bypass graft  2008  . Pacemaker generator change N/A 02/01/2014    Procedure: PACEMAKER GENERATOR CHANGE;  Surgeon: Duke Salvia, MD;  Location: [facility] CATH LAB;  Service: Cardiovascular;  Laterality: N/A;  . Left heart catheterization with coronary angiogram N/A 08/23/2014    Procedure: LEFT HEART CATHETERIZATION WITH CORONARY ANGIOGRAM;  Surgeon: Corky Crafts, MD;  Location: [facility] CATH LAB;  Service: Cardiovascular;  Laterality: N/A;    Family History: Family History  Problem Relation Age of Onset  . Stroke    . Heart disease    . Alzheimer's disease Mother   . Heart attack Father   . Heart attack Son   . Varicose Veins Son   . Deep vein thrombosis Son     Social History: Social History   Social History  . Marital Status: Divorced    Spouse Name: N/A  . Number of Children: N/A  . Years of Education: N/A   Occupational History  . Retired Therapist, music    Social History Main Topics  . Smoking status: Current Every Day Smoker -- 2.00 packs/day    Types: Cigarettes    Last Attempt to Quit: 01/30/2003  . Smokeless tobacco: Never Used  . Alcohol Use: 18.0 oz/week    24 Cans of beer, 6 Standard drinks or equivalent per week     Comment: occasionally  . Drug Use: No  . Sexual Activity: Not  Currently   Other Topics Concern  . None   Social History Narrative   ** Merged History Encounter **        Allergies:  Allergies  Allergen Reactions  . Warfarin Other (See Comments)    Non compliance and ETOH abuse    Objective:    Vital Signs:   Temp:  [97.3 F (36.3 C)-98.1 F (36.7 C)] 98 F (36.7 C) (12/16 0539) Pulse Rate:  [59-107] 107 (12/16 0539) Resp:  [18-20] 18 (12/16 0539) BP: (125-139)/(73-85) 127/75 mmHg (12/16 0539) SpO2:  [96 %-97 %] 96 % (12/16 0539) Weight:  [157 lb 3 oz (71.3 kg)] 157 lb 3 oz (71.3 kg) (12/16 0539) Last BM Date: 05/21/15  Weight change: Filed Weights   05/22/15 2159 05/23/15 0533 05/24/15 0539  Weight: 159 lb 9.8 oz (72.4 kg) 158 lb 11.7 oz (72 kg) 157 lb 3 oz (71.3 kg)    Intake/Output:   Intake/Output Summary (Last 24 hours) at 05/24/15 0842 Last data filed at 05/24/15 0540  Gross per 24 hour  Intake    600 ml  Output   1175 ml  Net   -575 ml     Physical Exam: General:  Elderly. No resp difficulty HEENT: normal Neck: supple. JVP 8. Carotids 2+ bilat; no bruits. No lymphadenopathy or thryomegaly appreciated. Cor: PMI nondisplaced. Regular rate & rhythm. No rubs, gallops or murmurs. Lungs: clear on 2 liters.  Abdomen: soft, nontender,  +distended. No hepatosplenomegaly. No bruits or masses. Good bowel sounds. Extremities: no cyanosis, clubbing, rash, edema Neuro: alert & orientedx3, cranial nerves grossly intact. moves all 4 extremities w/o difficulty. Affect pleasant  Telemetry: AV paced 90s  Labs: Basic Metabolic Panel:  Recent Labs Lab 05/22/15 1645 05/22/15 1916 05/23/15 0341 05/24/15 0310  NA 141 140 140 137  K 4.2 4.2 4.2 4.4  CL 103 101 100* 91*  CO2 29 30 33* 38*  GLUCOSE 59* 106* 100* 108*  BUN 15 14 13 16   CREATININE 1.05  1.16 1.09 1.37*  CALCIUM 9.0 9.1 8.8* 9.1  MG  --   --  1.8  --     Liver Function Tests: No results for input(s): AST, ALT, ALKPHOS, BILITOT, PROT, ALBUMIN in the last  168 hours. No results for input(s): LIPASE, AMYLASE in the last 168 hours. No results for input(s): AMMONIA in the last 168 hours.  CBC:  Recent Labs Lab 05/22/15 1645 05/22/15 1916 05/24/15 0310  WBC 9.7 10.0 10.9*  HGB 13.7 14.0 13.6  HCT 44.8 44.8 44.0  MCV 91.6 91.2 91.9  PLT 189 205 206    Cardiac Enzymes:  Recent Labs Lab 05/22/15 2257 05/23/15 0341 05/23/15 1005  TROPONINI 0.09* 0.11* 0.09*    BNP: BNP (last 3 results)  Recent Labs  01/27/15 2332 05/22/15 1645 05/22/15 2107  BNP 484.8* 604.2* 592.8*    ProBNP (last 3 results) No results for input(s): PROBNP in the last 8760 hours.   CBG: No results for input(s): GLUCAP in the last 168 hours.  Coagulation Studies:  Recent Labs  05/22/15 2257  LABPROT 18.8*  INR 1.57*    Other results:   Imaging: Dg Chest 2 View  05/22/2015  CLINICAL DATA:  Shortness of breath today, former smoker, hypertension, CHF, COPD, coronary artery disease, stroke EXAM: CHEST  2 VIEW COMPARISON:  04/03/2015 FINDINGS: LEFT subclavian transvenous pacemaker and AICD leads projecting at RIGHT atrium and RIGHT ventricle. Epicardial pacing leads also present. Enlargement of cardiac silhouette post MVR. Pulmonary vascular congestion. Peribronchial thickening with scattered chronic interstitial changes, stable. Blunting of the LEFT lateral costophrenic angle with calcified pleural plaque formation LEFT chest No acute infiltrate, pleural effusion or pneumothorax. Bones demineralized with multiple old RIGHT rib fractures. IMPRESSION: Enlargement of cardiac silhouette post pacemaker, AICD and MVR. Chronic bronchitic interstitial changes. No acute abnormalities. Electronically Signed   By: Ulyses Southward M.D.   On: 05/22/2015 19:07      Medications:     Current Medications: . apixaban  5 mg Oral BID  . atorvastatin  20 mg Oral q1800  . digoxin  0.125 mg Oral Daily  . fenofibrate  54 mg Oral Daily  . folic acid  1 mg Oral Daily   . furosemide  60 mg Intravenous 3 times per day  . lisinopril  5 mg Oral Daily  . LORazepam  0-4 mg Intravenous Q6H   Followed by  . LORazepam  0-4 mg Intravenous Q12H  . multivitamin with minerals  1 tablet Oral Daily  . nicotine  21 mg Transdermal Daily  . sodium chloride  3 mL Intravenous Q12H  . spironolactone  25 mg Oral Daily  . thiamine  100 mg Oral Daily     Infusions:      Assessment/Plan/Discussion   Mr Hayward is a 70 year old with mixed ischemic/nonischemic HF admitted with mild volume overload.   1. Acute/Chronic systolic CHF: ECHO 12/14/7/19/16 EF 30-35%. Mixed ischemic/nonischemic (ETOH) cardiomyopathy. S/p Medtronic CRT-D. NYHA class II symptoms. Diuresed with IV lasix. Appears euvolemic. Stop IV lasix and start lasix 40 mg po twice daily. Continue 25 mg spiro daily. Renal function up a little.  - Continue lisinopril 5 mg daily.  - Add coreg 3.125 mg twice a day. Continue digoxin 0.125 daily.  2. Atrial fibrillation: Chronic. He has had AV nodal ablation with BiV pacing.  - Eliquis should be 5 mg BID. 3. CAD: No chest pain but mild troponin elevation, no trend.  Suspect demand ischemia in the setting of volume overload.LHC (  3/16) with moderate distal LM stenosis (IVUS 6.9 mm2), 80% ostial LCx, total occlusion RCA with collaterals. Ostial LCx not ideal for PCI, managed medically. No ASA given apixaban use.  Continue atorvastatin.   5. Mitral valve replacement: Bioprosthetic valve currently, originally Bjork-Shiley. Valve looked ok on 7/16 echo.  6. ETOH abuse: Continues to drink beer as noted at HF appointment this week. Needs to stop.  7. Smoking: Says he is smoking seldomly. Encouraged to quit completely. 8. Social status: . Refer to [facility] Paramedicine.   From HF perspective he seems stable. He has follow up in the HF on December 20th at 3:20. Check BMET at that time.   Length of Stay: 2  Amy Clegg NP-C  05/24/2015, 8:42 AM   Patient seen  with NP, agree with the above note.  Still mild volume overload but improved and comfortable.   - Mild troponin elevation, no trend.  Likely demand ischemia with volume overload.  - Oxygen saturation drops with walking, will need home oxygen with exertion (likely from COPD, he continues to smoke).   - Now on po Lasix, continue 40 mg bid for home.  - Continue current lisinopril, digoxin, and spironolactone - Add Coreg 3.125 mg bid.  - Eliquis 5 mg bid for atrial fibrillation.   Think he can go home today or tomorrow.  Hopefully will live with his daughter, this will minimize ETOH intake.  Would be good for paramedicine program.   Marca Ancona 05/24/2015 1:09 PM   Advanced Heart Failure Team Pager [phone] (M-F; 7a - 4p)  Please contact [facility] for night-coverage after hours (4p -7a ) and weekends on [email or web address]

## 2015-05-24 NOTE — Discharge Instructions (Signed)

## 2015-05-24 NOTE — Progress Notes (Signed)
Discharge follow-up and education reviewed with patient. No questions or concerns at this time.

## 2015-05-24 NOTE — Progress Notes (Signed)
Patient ambulated 350 ft without supplemental oxygen. Oxygen saturation 86-94%. Placed on 2L oxygenation 92-96%.

## 2015-05-27 IMAGING — CR DG CHEST 2V
2 series · 2 of 2 positions shown · non-contrast
Comparison: 03/19/2014

CLINICAL DATA: Right-sided chest pain.  Smoker.

EXAM:
CHEST  2 VIEW

[chest pa]
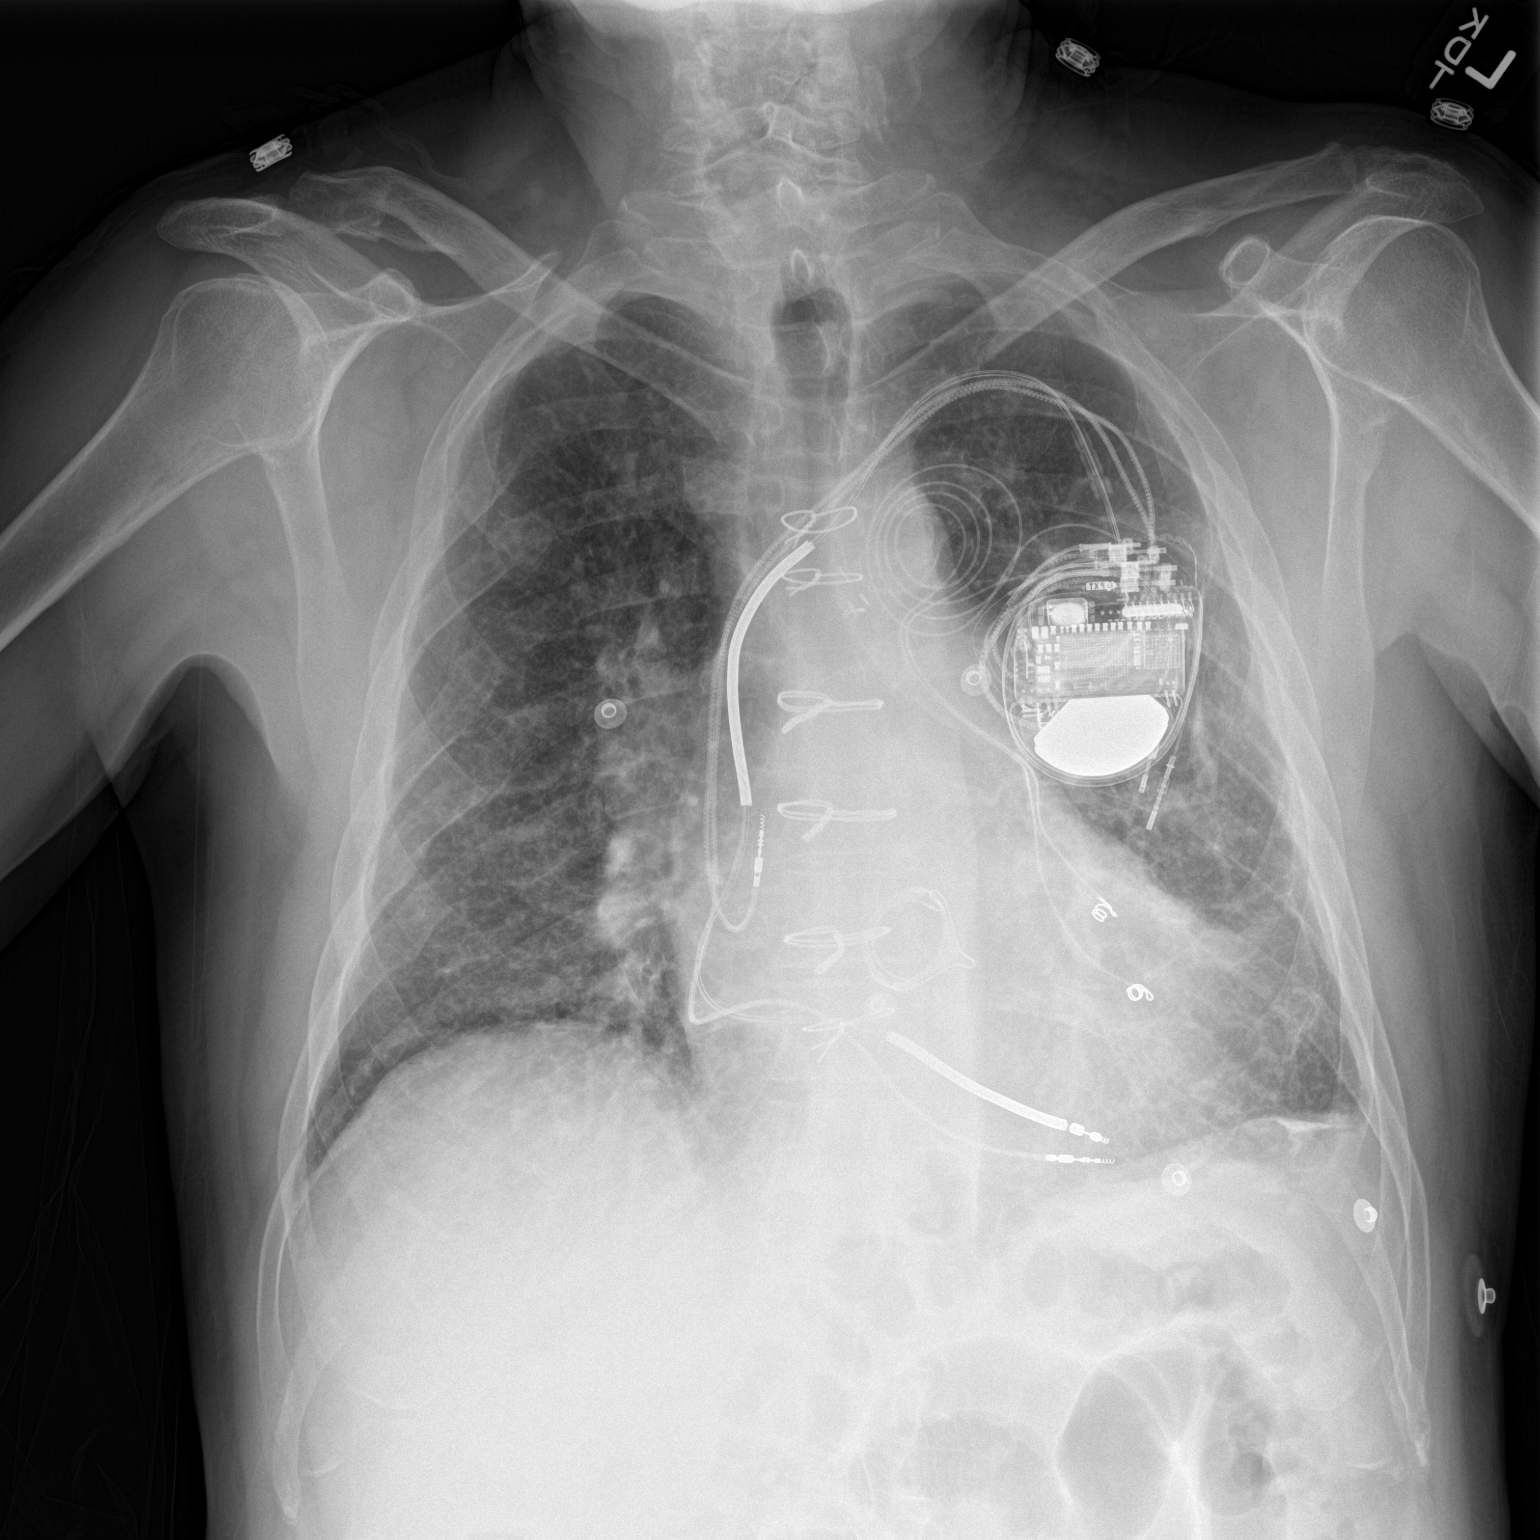

[chest lat]
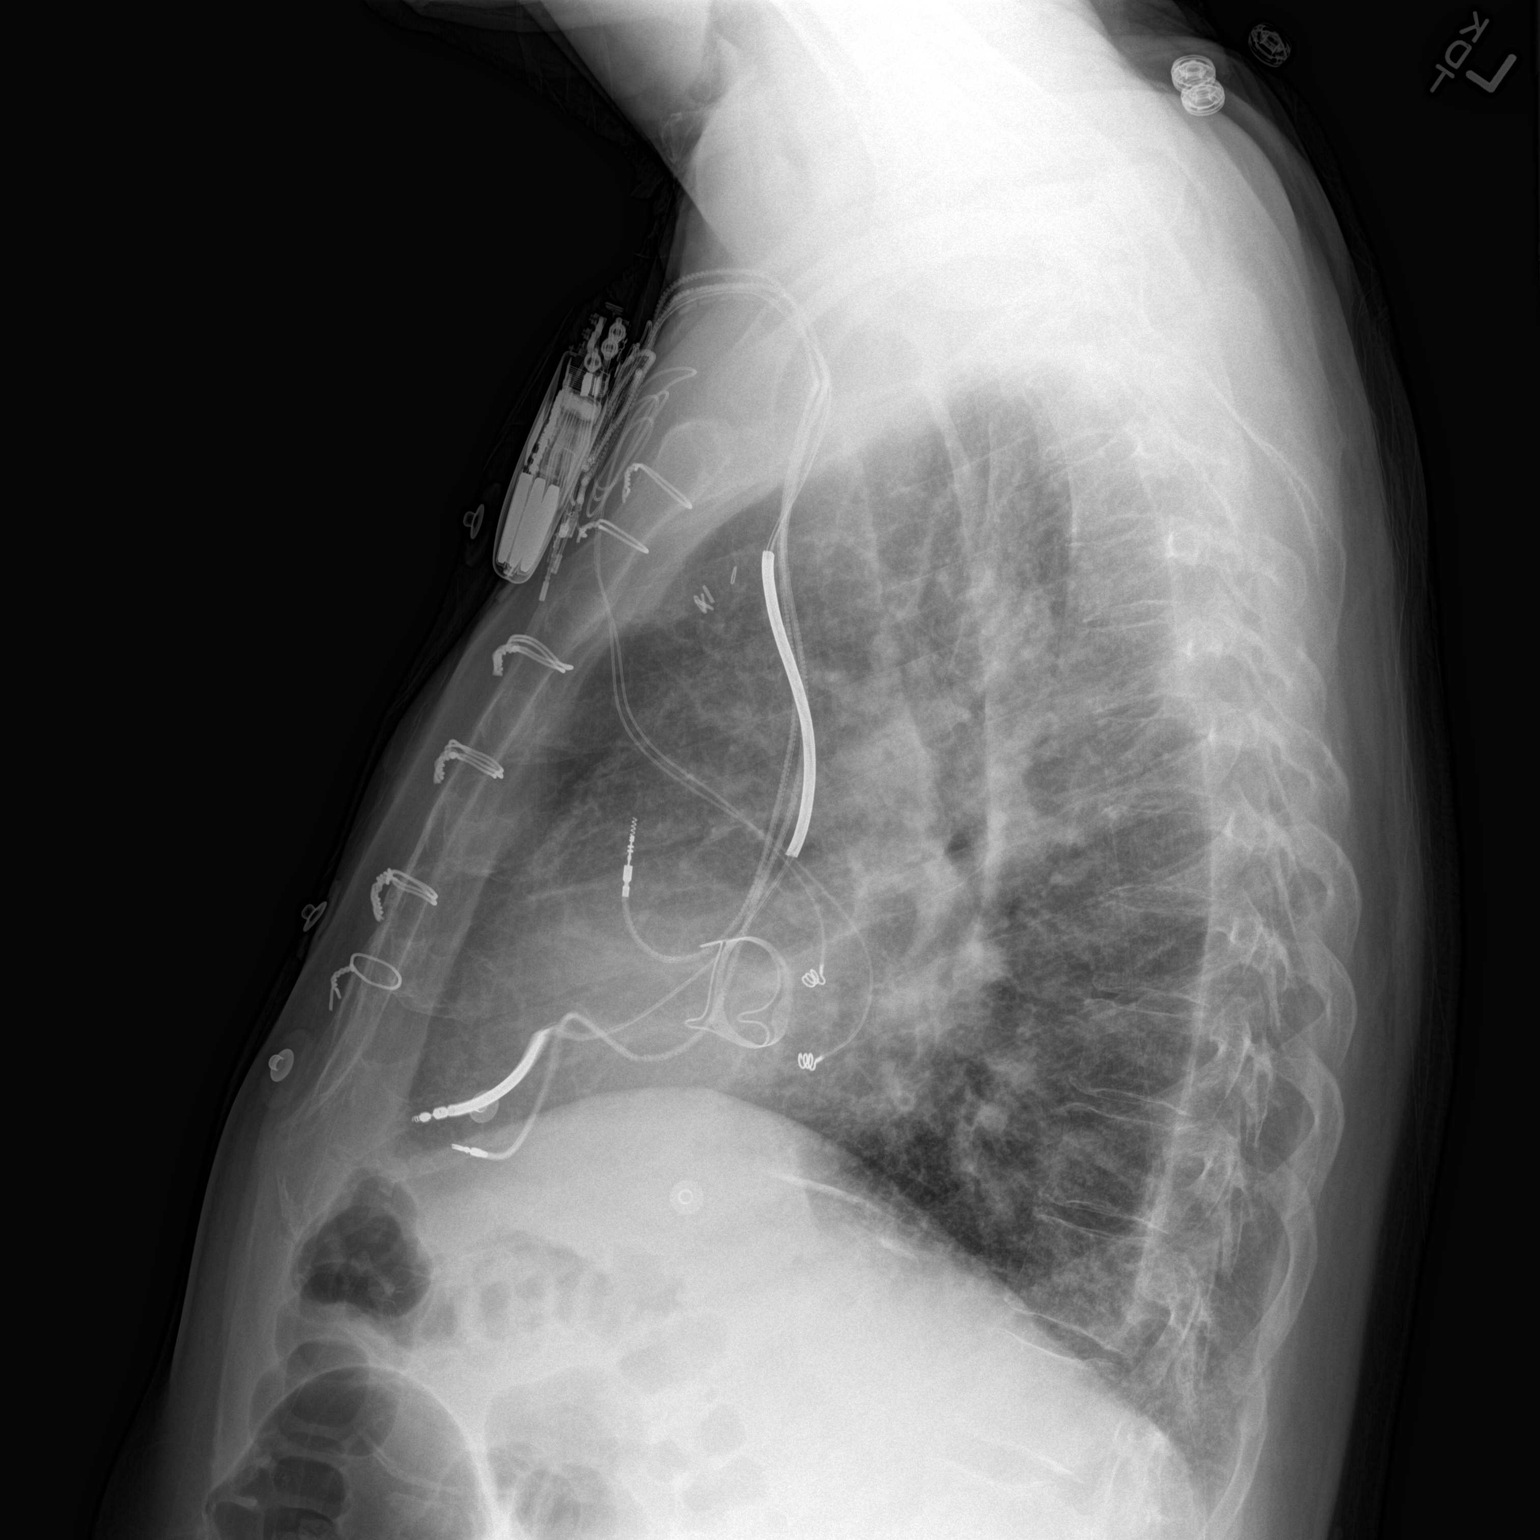

[2 of 2 positions shown; findings below may reference images not displayed]

FINDINGS: Sternotomy wires, mitral valve and left-sided pacemaker are
unchanged. Lungs are adequately inflated and demonstrate hazy
prominence of the perihilar markings likely mild vascular
congestion. No evidence of effusion. There is calcified pleural
plaque over the left diaphragm. Mild stable cardiomegaly. Remainder
of the exam is unchanged.
IMPRESSION: Mild stable cardiomegaly with findings suggesting mild vascular
congestion.

Asbestos related pleural disease.

## 2015-05-28 ENCOUNTER — Encounter (HOSPITAL_COMMUNITY): Payer: Self-pay

## 2015-05-28 ENCOUNTER — Ambulatory Visit (HOSPITAL_COMMUNITY)
Admission: RE | Admit: 2015-05-28 | Discharge: 2015-05-28 | Disposition: A | Discharge status: Home / Self Care | Payer: Commercial Managed Care - HMO | Source: Ambulatory Visit | Attending: Cardiology | Admitting: Cardiology

## 2015-05-28 VITALS — BP 122/72 | HR 61 | Wt 156.8 lb

## 2015-05-28 DIAGNOSIS — I482 Chronic atrial fibrillation: Secondary | ICD-10-CM | POA: Diagnosis not present

## 2015-05-28 DIAGNOSIS — I481 Persistent atrial fibrillation: Secondary | ICD-10-CM | POA: Diagnosis not present

## 2015-05-28 DIAGNOSIS — Z720 Tobacco use: Secondary | ICD-10-CM | POA: Diagnosis not present

## 2015-05-28 DIAGNOSIS — Z954 Presence of other heart-valve replacement: Secondary | ICD-10-CM | POA: Diagnosis not present

## 2015-05-28 DIAGNOSIS — I739 Peripheral vascular disease, unspecified: Secondary | ICD-10-CM | POA: Insufficient documentation

## 2015-05-28 DIAGNOSIS — I2510 Atherosclerotic heart disease of native coronary artery without angina pectoris: Secondary | ICD-10-CM

## 2015-05-28 DIAGNOSIS — F1010 Alcohol abuse, uncomplicated: Secondary | ICD-10-CM | POA: Insufficient documentation

## 2015-05-28 DIAGNOSIS — I059 Rheumatic mitral valve disease, unspecified: Secondary | ICD-10-CM | POA: Diagnosis not present

## 2015-05-28 DIAGNOSIS — I4819 Other persistent atrial fibrillation: Secondary | ICD-10-CM

## 2015-05-28 DIAGNOSIS — F17200 Nicotine dependence, unspecified, uncomplicated: Secondary | ICD-10-CM | POA: Insufficient documentation

## 2015-05-28 DIAGNOSIS — I5022 Chronic systolic (congestive) heart failure: Secondary | ICD-10-CM

## 2015-05-28 MED ORDER — FENOFIBRATE 48 MG PO TABS: 48.0000 mg | ORAL_TABLET | Freq: Every day | ORAL | Status: DC

## 2015-05-28 MED ORDER — APIXABAN 5 MG PO TABS: 5.0000 mg | ORAL_TABLET | Freq: Two times a day (BID) | ORAL | Status: DC

## 2015-05-28 MED ORDER — LOSARTAN POTASSIUM 25 MG PO TABS: 25.0000 mg | ORAL_TABLET | Freq: Every day | ORAL | Status: DC

## 2015-05-28 NOTE — Patient Instructions (Signed)
Increase Losartan to 25 mg daily  Take Fenofibrate 48 mg daily  Take Eliquis 5 mg Twice daily   Labs in 2 weeks  Your physician recommends that you schedule a follow-up appointment in: 3-4 weeks

## 2015-05-30 ENCOUNTER — Encounter: Payer: Self-pay | Admitting: Internal Medicine

## 2015-05-30 DIAGNOSIS — I5022 Chronic systolic (congestive) heart failure: Secondary | ICD-10-CM | POA: Insufficient documentation
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
## 2015-06-03 IMAGING — DX DG CHEST 2V
2 series · 2 of 2 positions shown · non-contrast
Comparison: 08/19/2014

CLINICAL DATA: Fluid retention.  Anuria.

EXAM:
CHEST  2 VIEW

[chest pa]
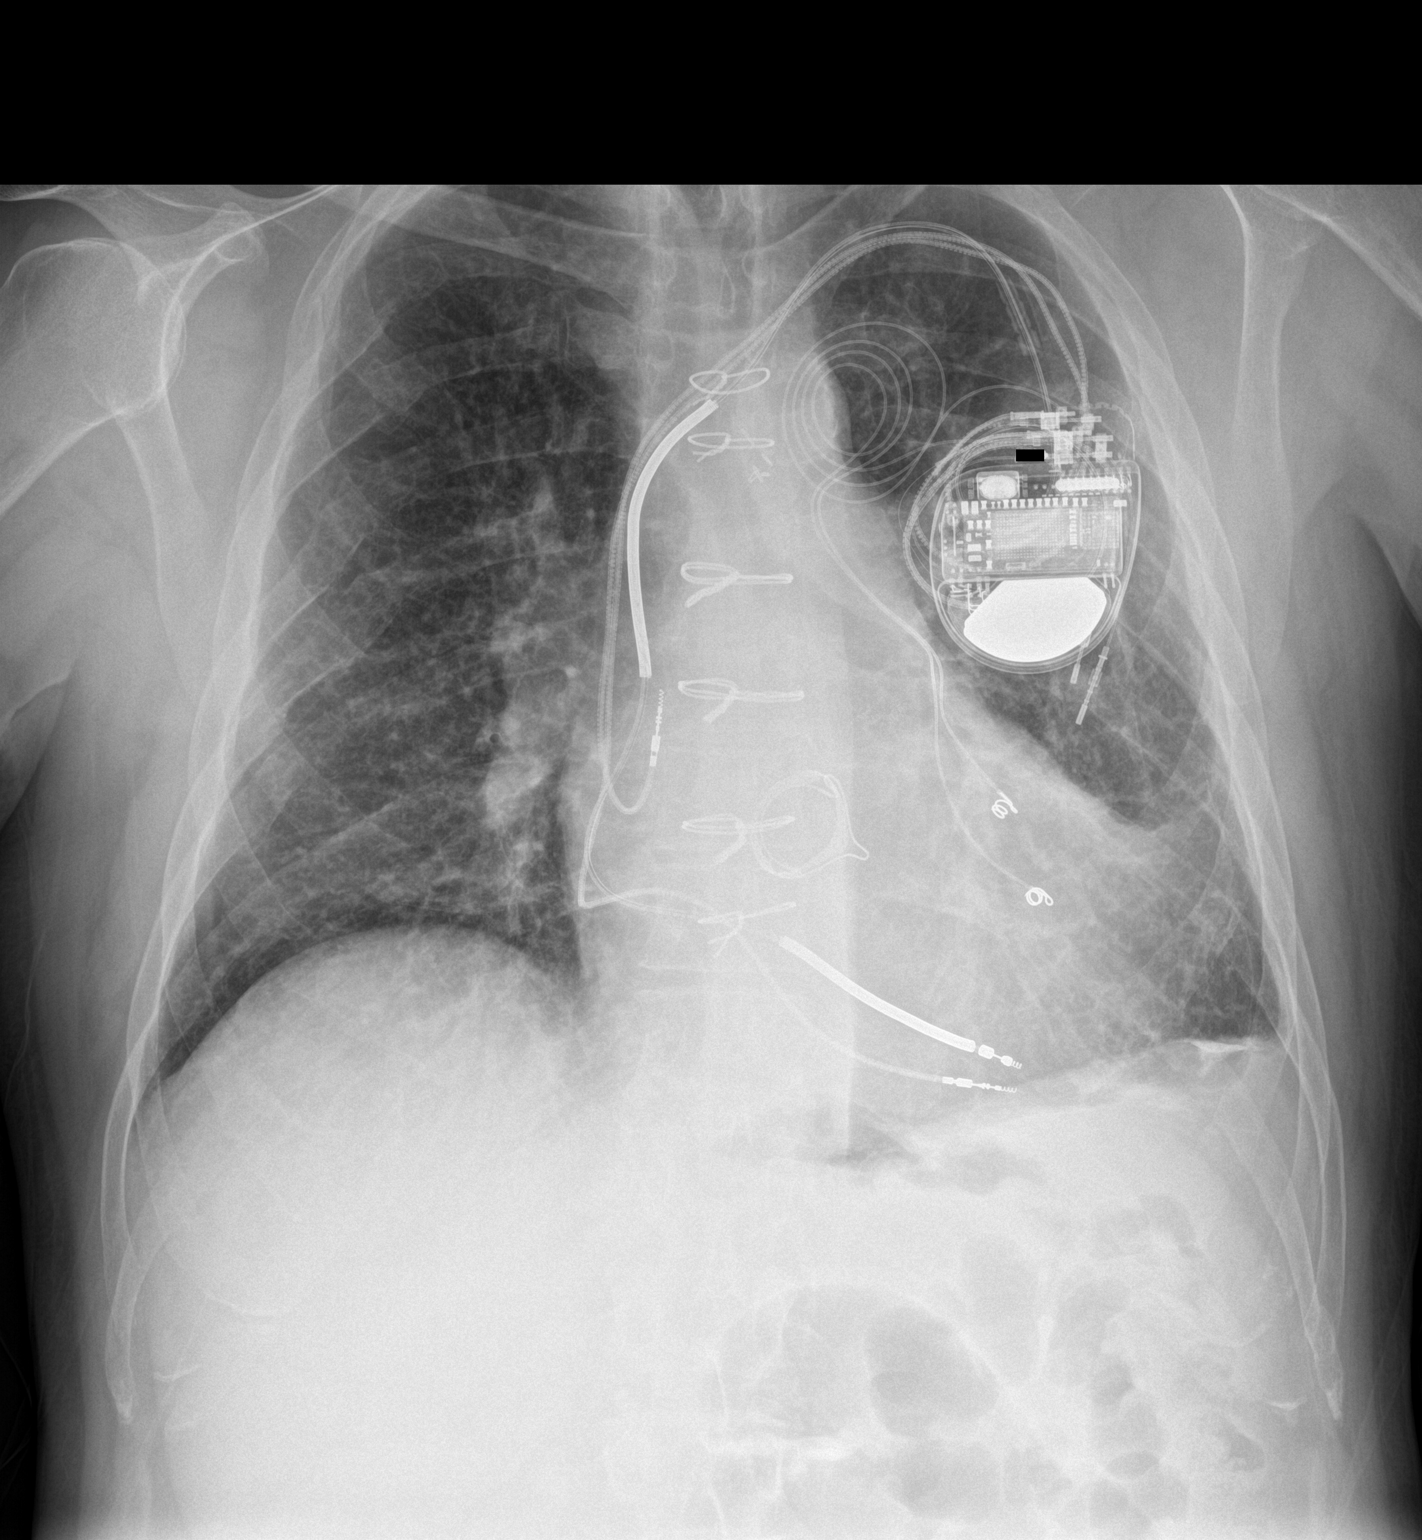

[chest lat]
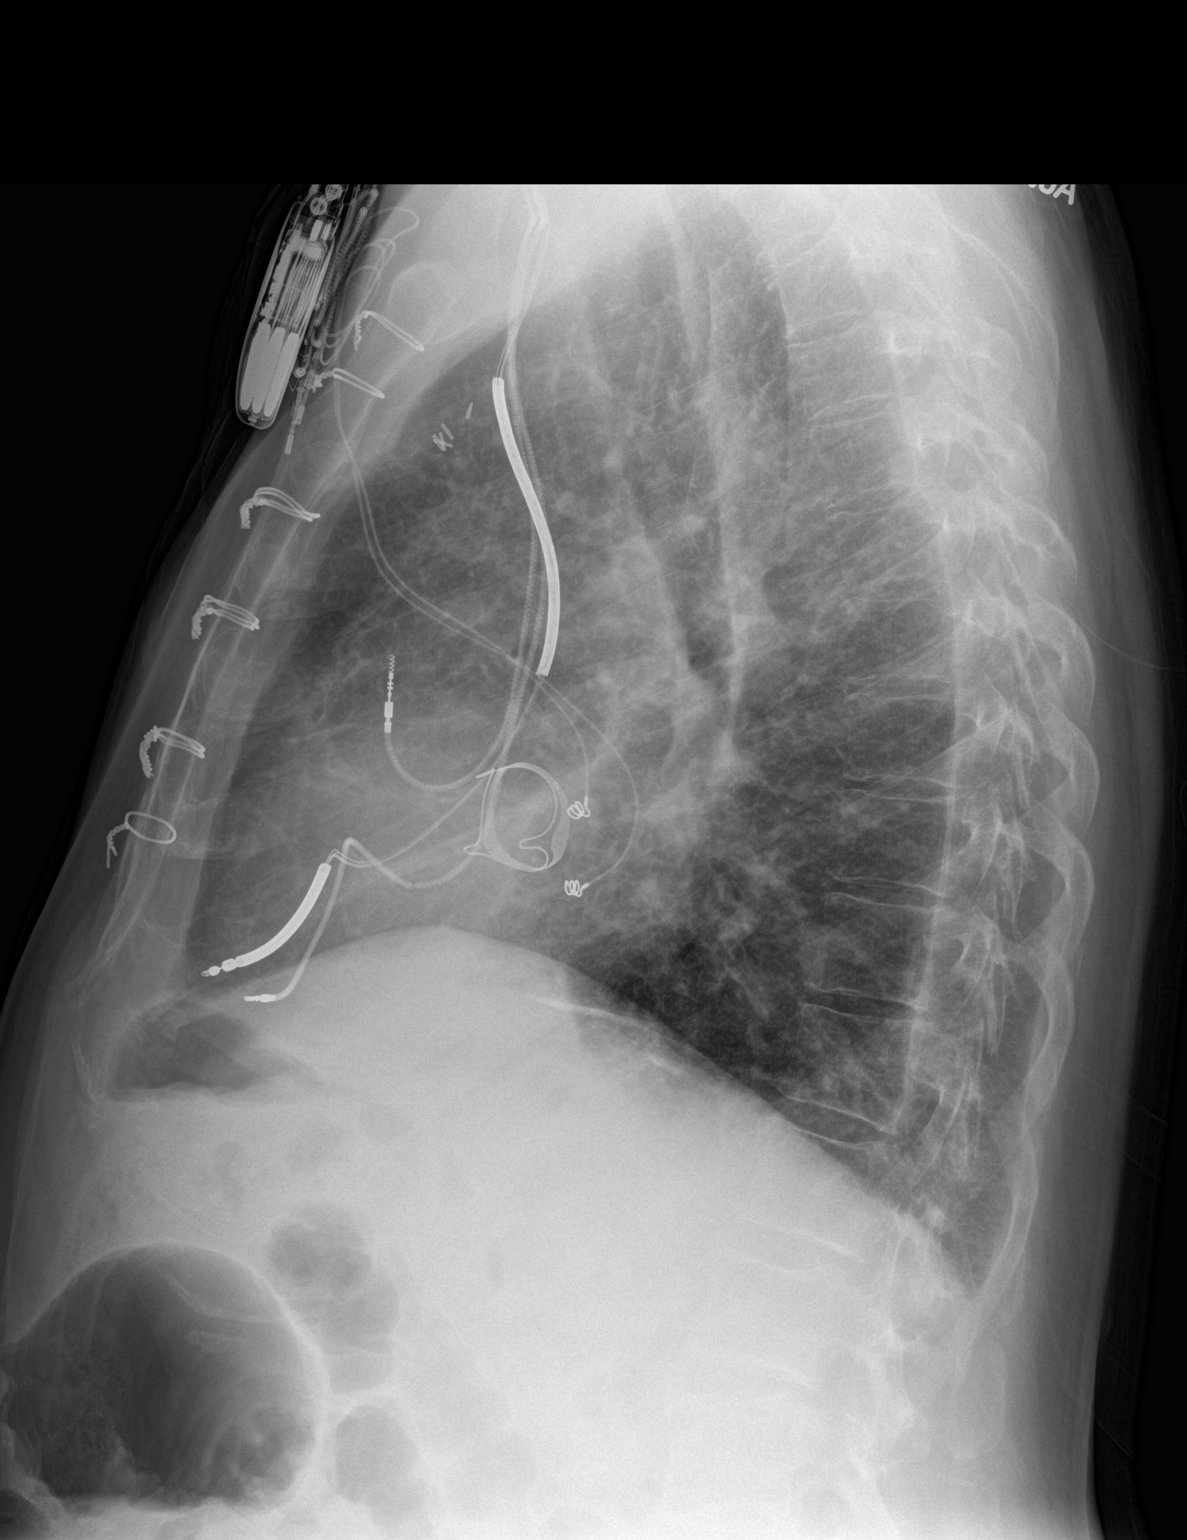

[2 of 2 positions shown; findings below may reference images not displayed]

FINDINGS: There is moderate unchanged cardiomegaly. There is prior sternotomy
and mitral valvuloplasty. There are intact appearances of the
transvenous leads. Calcified pleural plaque is again evident.
Extensive chronic appearing interstitial coarsening is again
evident. No superimposed acute infiltrate or congestive heart
failure is evident. There are no effusions.
IMPRESSION: Cardiomegaly, asbestos related pleural plaque, and chronic
interstitial coarsening. No superimposed acute cardiopulmonary
abnormalities are evident.

## 2015-06-04 ENCOUNTER — Other Ambulatory Visit: Payer: Self-pay

## 2015-06-04 NOTE — Patient Outreach (Signed)
[facility]) Care Management  06/04/2015  [name] [birth year] [id number]   Referral Date:  05/27/2015 Referral Source:  [facility] tier 4 Referral Issue:  COPD with 3 ED visits and 3 admits.   Outreach call to patient.  Patient not reached at [phone] home/cell. No answer following several rings; unable to leave voice message.  RN CM will schedule for next contact call within one week.   Donato Schultz, RN, BSN, [facility], CCM  [facility] [employer] [facility] Coordinator [phone] Direct [phone] Cell [phone] Office [phone] Fax

## 2015-06-05 ENCOUNTER — Ambulatory Visit: Payer: Self-pay

## 2015-06-05 ENCOUNTER — Other Ambulatory Visit: Payer: Self-pay

## 2015-06-05 NOTE — Patient Outreach (Addendum)
[facility]) Care Management  06/05/2015  [name] 07-10-1945 [id number]   Referral Date: 05/27/2015 Referral Source: [facility] tier 4 Referral Issue: COPD with 3 ED visits and 3 admits.   Outreach call #2 to patient. Patient not reached at [phone] home/cell. No answer following several rings; unable to leave voice message.  RN CM reviewed Epic MR:  H/o patient discharged home with Daughter, [name] on last d/c date 05/24/2015.  Melinda Pottinger Hal [address] [place] [address] [phone] home  [phone] cell  [facility] Conditions:  Chronic systolic HF, COPD, HTN, Other:  A-fib.  Last admission:  05/22/15 - 05/24/15 CHF and discharged with home oxygen ([facility]).  RN CM will attempt to reach patient at daughter's home and schedule for next contact call within one week   Donato Schultz, RN, BSN, [facility], CCM  [facility] [employer] Care Management Care Management Coordinator [phone] Direct [phone] Cell [phone] Office [phone] Fax

## 2015-06-06 ENCOUNTER — Ambulatory Visit: Payer: Self-pay

## 2015-06-07 IMAGING — CR DG CHEST 2V
2 series · 2 of 2 positions shown · non-contrast
Comparison: Chest x-ray of 08/26/2014 and CT chest of 02/08/1999 50

CLINICAL DATA: Cough, shortness of breath for 2 weeks

EXAM:
CHEST  2 VIEW

[w chest pa]
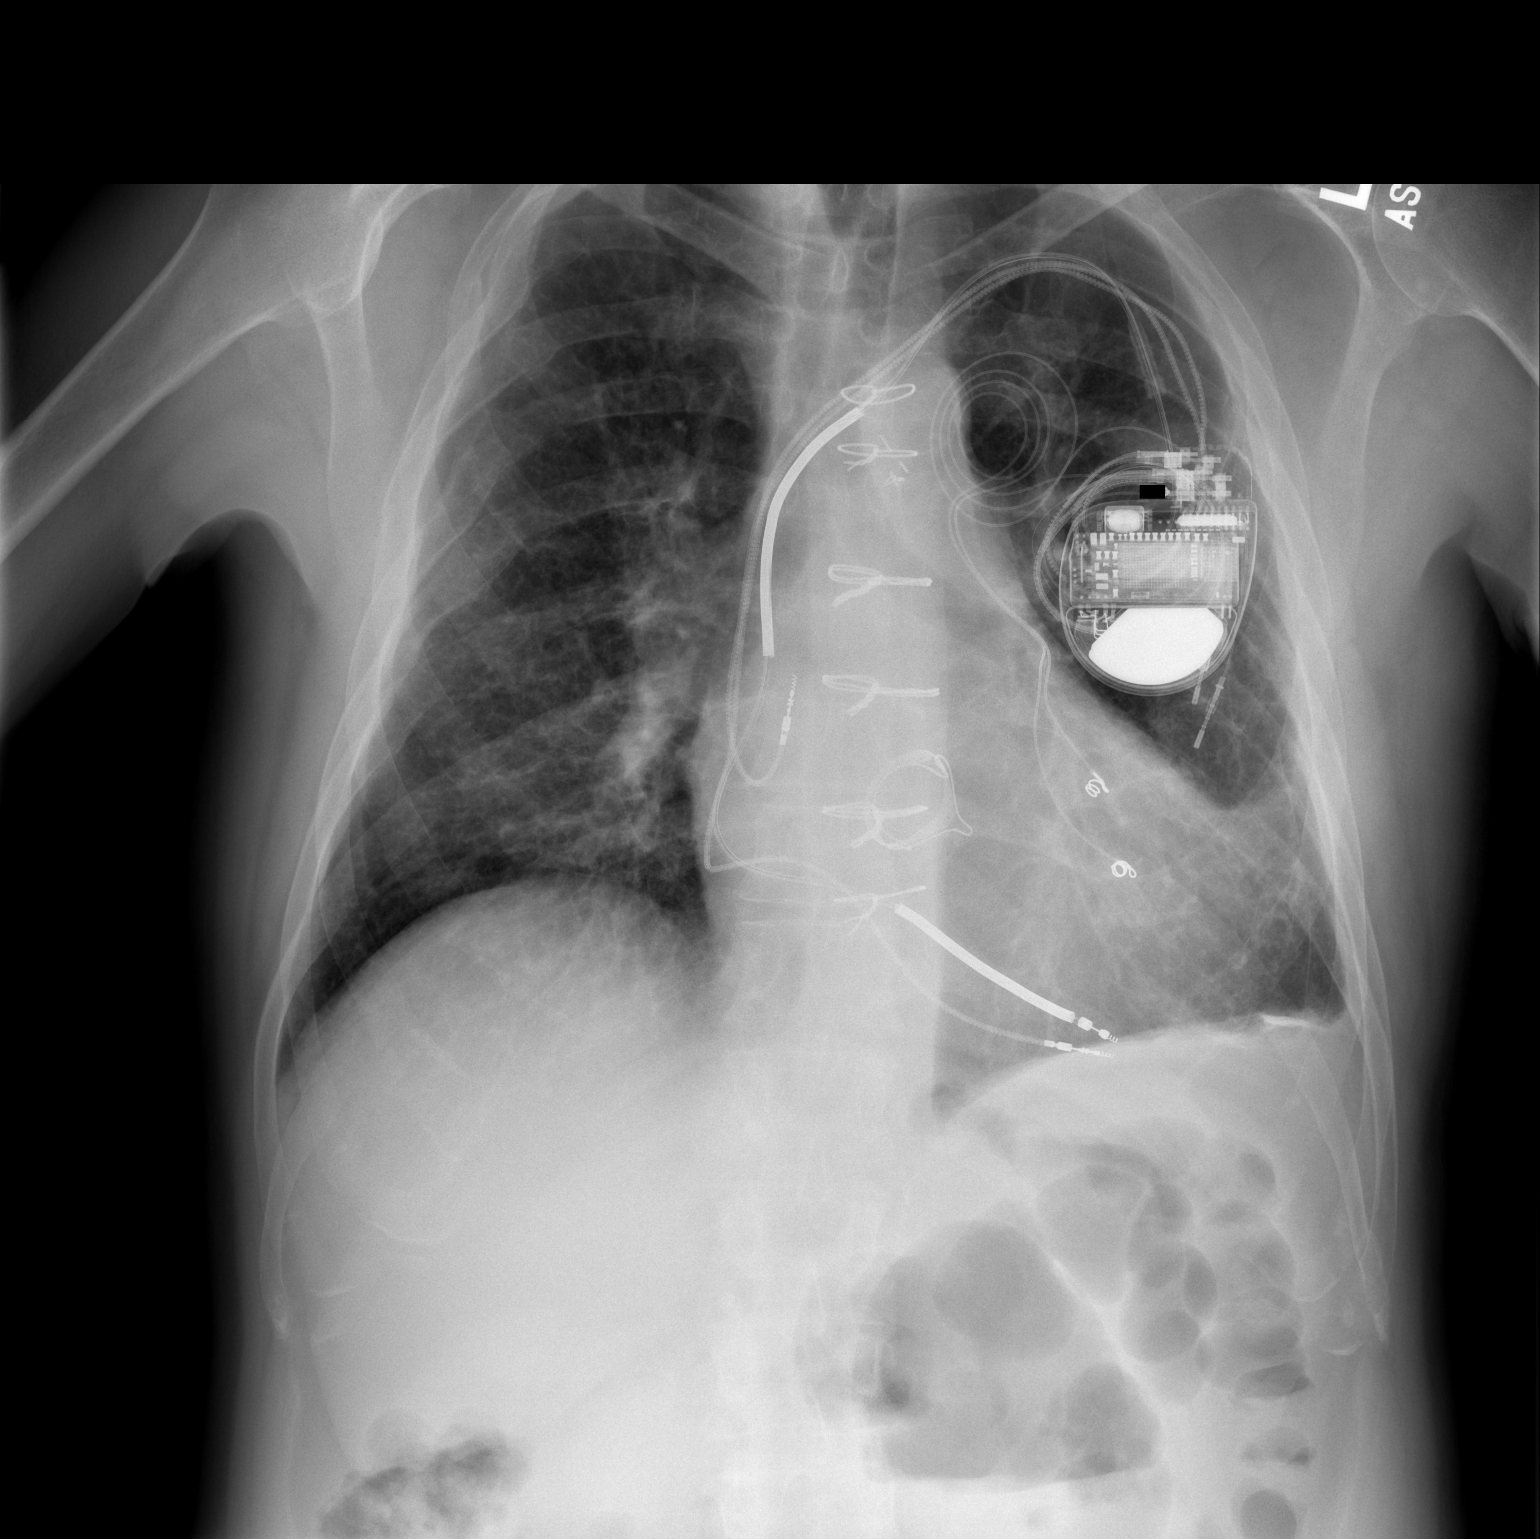

[w chest lat]
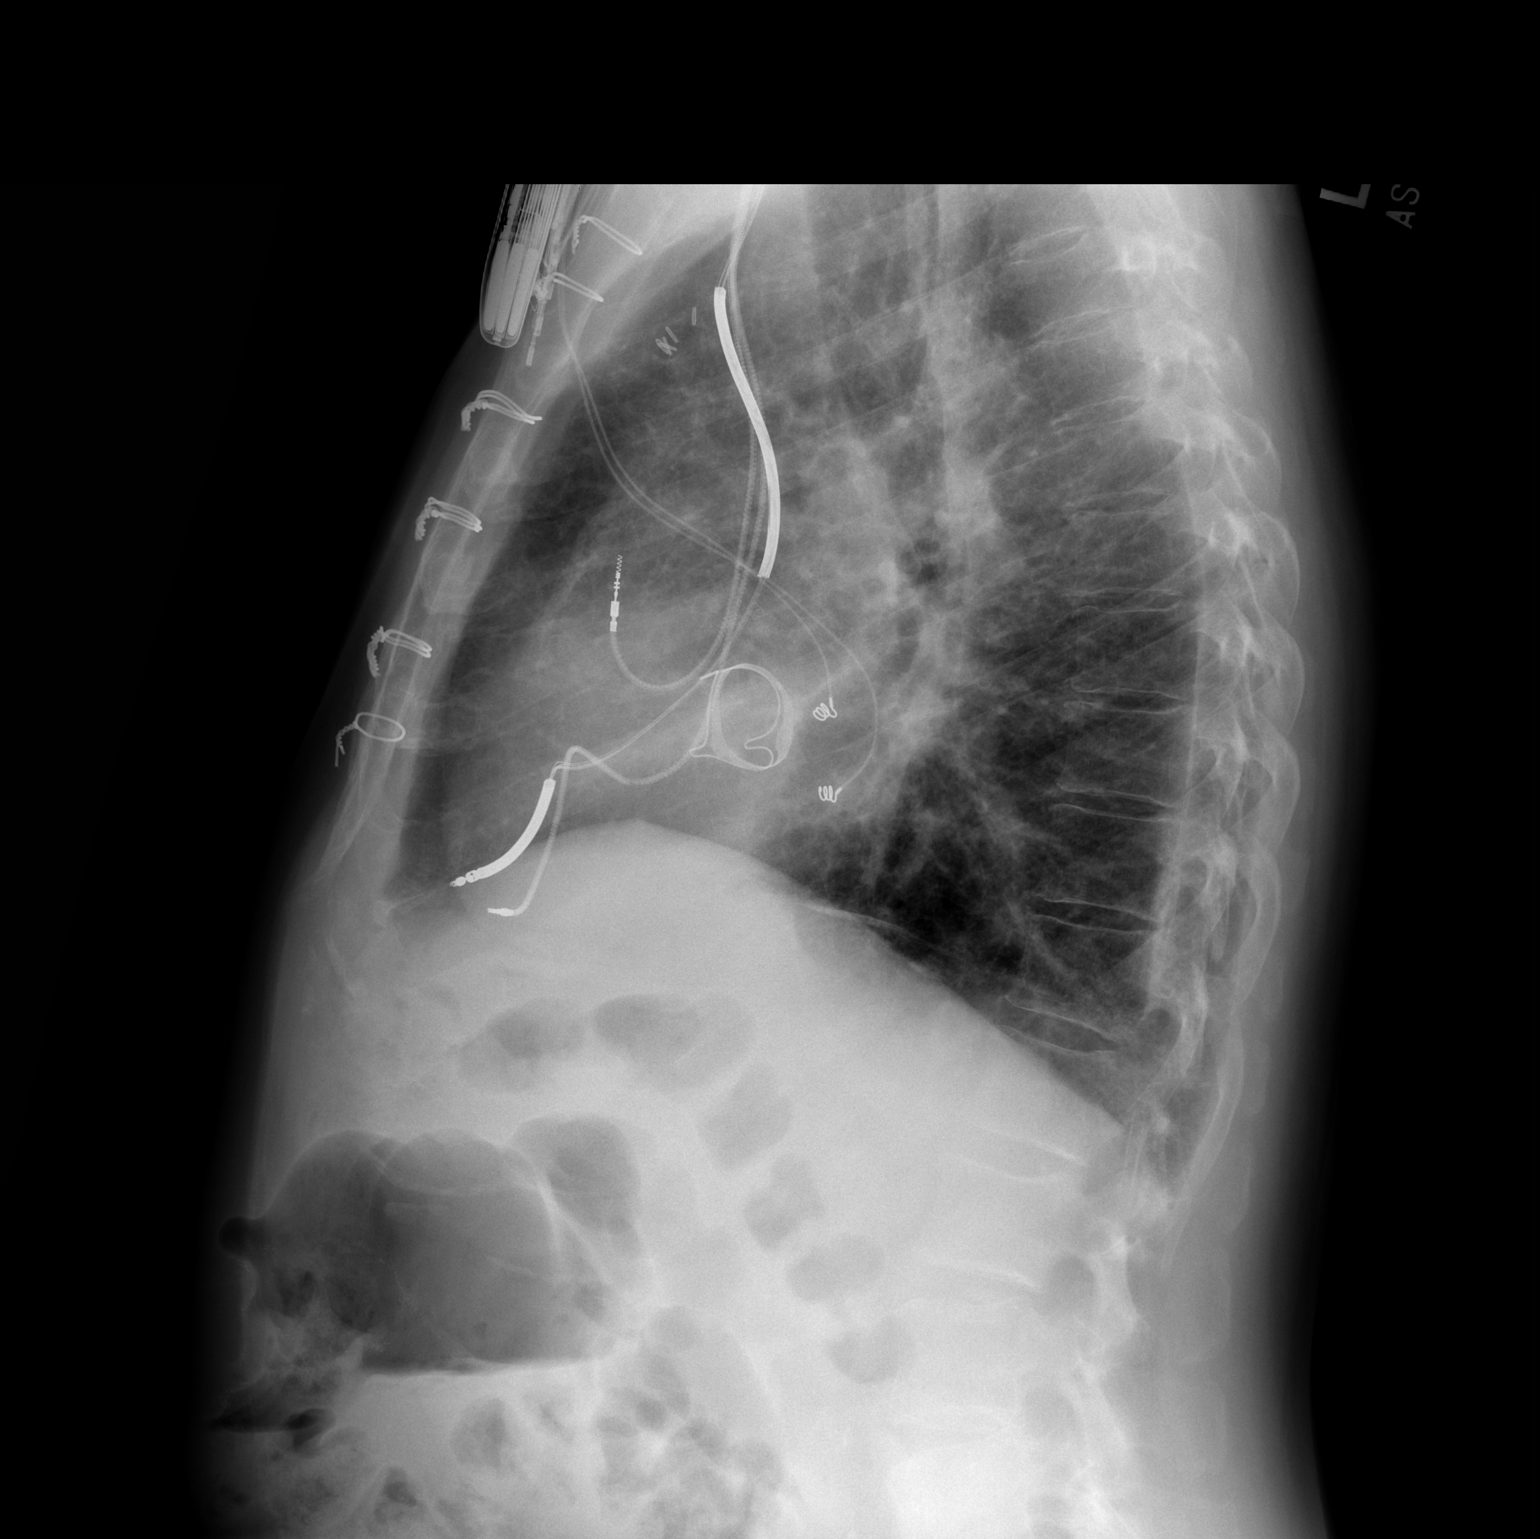

[2 of 2 positions shown; findings below may reference images not displayed]

FINDINGS: Somewhat prominent interstitial markings remain consistent with mild
fibrotic change. No definite pneumonia is noted. There are prominent
perihilar markings with some peribronchial thickening which may
indicate bronchitis. No adenopathy is evident. Cardiomegaly is
stable. AICD leads remain. Epicardial leads also are present. There
is some calcification of the left hemidiaphragm. No bony abnormality
is noted. Old right posterolateral rib fractures are noted.
IMPRESSION: 1. No definite pneumonia.  Cannot exclude bronchitis.
2. Stable calcification of the left hemidiaphragm most likely
asbestos related.
3. Stable cardiomegaly with AICD leads.

## 2015-06-13 ENCOUNTER — Ambulatory Visit: Payer: Self-pay

## 2015-06-14 ENCOUNTER — Other Ambulatory Visit: Payer: Self-pay

## 2015-06-14 ENCOUNTER — Other Ambulatory Visit (HOSPITAL_COMMUNITY): Payer: Commercial Managed Care - HMO

## 2015-06-14 NOTE — Patient Outreach (Signed)
[facility]) Care Management  06/14/2015  [name] 04-18-1946 [id number]   Referral Date: 05/27/2015 Referral Source: [facility] tier 4 Referral Issue: COPD with 3 ED visits and 3 admits.   Outreach call #3 to patient. Patient not reached at [phone] home/cell. No answer following several rings; unable to leave voice message.  Per Epic MR:  H/o [facility] Conditions: Chronic systolic HF, COPD, HTN, Other: A-fib.  Last admission: 05/22/15 - 05/24/15 CHF and discharged with home oxygen ([facility]). H/o patient discharged home with Daughter, [name] on last d/c date 05/24/2015.  [address] [place] [address] [phone] home  [phone] cell  Additional same day outreach call to [facility] cell #; not reached.  RN CM left HIPAA compliant voice message with name and call back #.  RN CM sent unsuccessful outreach letter to patient.  RN CM will review case in 2 weeks for any response.   Donato Schultz, RN, BSN, [facility], CCM  [facility] [employer] Management Coordinator [phone] Direct [phone] Cell [phone] Office [phone] Fax

## 2015-06-25 ENCOUNTER — Encounter: Payer: Medicare Other | Admitting: Internal Medicine

## 2015-06-26 ENCOUNTER — Encounter: Payer: Self-pay | Admitting: Internal Medicine

## 2015-06-27 ENCOUNTER — Encounter (HOSPITAL_COMMUNITY): Payer: Commercial Managed Care - HMO

## 2015-07-03 ENCOUNTER — Other Ambulatory Visit: Payer: Self-pay

## 2015-07-03 NOTE — Patient Outreach (Signed)
[facility]) Care Management  07/03/2015  [name] Aug 04, 1945 [id number]   Inbound call returned from Dr. Debroah Baller office contact: Sharyl Nimrod.  Verified patient has signed HIPAA consent with permission to discuss PHI and healthcare management needs with patient's  Daughter, [name].   Plan: RN CM will continue outreach screening calls to attempt [facility] engagement.    Donato Schultz, RN, BSN, [facility], CCM  [facility] [employer] Management Coordinator [phone] Direct [phone] Cell [phone] Office [phone] Fax

## 2015-07-03 NOTE — Patient Outreach (Signed)
[facility]) Care Management  07/03/2015  DOMENIC DISTASIO Sep 08, 1945 [id number]   Outreach call to Primary MD:  Dr. Cain Saupe [phone] to assess if MD office has authorization to discuss and / or complete calls with patient's daughter, [name] to discus PHI due to unsuccessful outreach to patient following 3 attempts and letter.   RN CM left voice mail message on Dr. Debroah Baller CMA line requesting call back regarding the above information.    Donato Schultz, RN, BSN, [facility], CCM  [facility] [employer] Management Coordinator [phone] Direct [phone] Cell [phone] Office [phone] Fax

## 2015-07-08 ENCOUNTER — Other Ambulatory Visit: Payer: Self-pay

## 2015-07-08 NOTE — Patient Outreach (Signed)
[facility]) Care Management  07/08/2015  [name] 1946/01/22 [id number]  Referral Date: 05/27/2015 Referral Source: [facility] tier 4 Referral Issue: COPD with 3 ED visits and 3 admits.  Per Epic MR: H/o [facility] Conditions: Chronic systolic HF, COPD, HTN, Other: A-fib.  Last admission: 05/22/15 - 05/24/15 CHF and discharged with home oxygen ([facility]). H/o patient discharged home with Daughter, [name] on last d/c date 05/24/2015.   Outreach call #1 to daughter, [name] for continued outreach attempts following 3 unsuccessful calls and letter to patient for [facility] Screening.   [address]  [address] [place] [address] [phone] home  [phone] cell  Contact not reached.  RN CM left HIPAA compliant voice message with name and number.  RN CM scheduled for next outreach call.    Donato Schultz, RN, BSN, [facility], CCM  [facility] [employer] Management Coordinator [phone] Direct [phone] Cell [phone] Office [phone] Fax

## 2015-07-09 ENCOUNTER — Ambulatory Visit: Payer: Self-pay

## 2015-07-10 ENCOUNTER — Encounter: Payer: Self-pay | Admitting: *Deleted

## 2015-07-10 ENCOUNTER — Ambulatory Visit: Payer: Self-pay

## 2015-07-11 ENCOUNTER — Ambulatory Visit: Payer: Self-pay

## 2015-07-16 ENCOUNTER — Ambulatory Visit: Payer: Self-pay

## 2015-07-18 ENCOUNTER — Ambulatory Visit: Payer: Self-pay

## 2015-07-23 ENCOUNTER — Other Ambulatory Visit: Payer: Self-pay

## 2015-07-23 VITALS — BP 122/72 | Ht 67.0 in | Wt 157.0 lb

## 2015-07-23 DIAGNOSIS — I5022 Chronic systolic (congestive) heart failure: Secondary | ICD-10-CM

## 2015-07-23 NOTE — Patient Outreach (Signed)
[facility]) Care Management  07/23/2015  [name] 10-14-1945 [id number]   Care Coordination Services  Contact call to Primary MD, [name]. Issue:  appt verification Office contact verified last appt 05/21/15 and next appt 07/25/15.    Donato Schultz, RN, BSN, [facility], CCM  [facility] [employer] Management Coordinator [phone] Direct [phone] Cell [phone] Office [phone] Fax

## 2015-07-23 NOTE — Patient Outreach (Signed)
[facility] ([facility]) Care Management  07/23/2015  [name] 07/27/1945 [id number]   Care Coordination Services  [facility] [address] [phone] Issue:  Eliquis  RN CM verified with pharmacist;  -does not appear that patient has used 30 day free trail card over the past year. -Co-pay for Eliquis in 2016 was $47.00 however, patient has changed providers and has deductible to meet in 2017.  Current patient co-pay cost is $196.03 until deductible is met.  Pharmacy states unable to determine what co-pay will be after deductible has been met.   Plan:   RN CM will mail Eliquis 30 day free trail medication card to patient and notify daughter to watch for card to come in the mail.   Brytni Dray, RN, BSN, MSHL, CCM  [facility] Care Management Care Management Coordinator [phone] Direct [phone] Cell [phone] Office [phone] Fax    

## 2015-07-23 NOTE — Patient Outreach (Addendum)
[facility]) Care Management  07/23/2015  [name] 1945-10-11 [id number]  Referral Date: 05/27/2015 Referral Source: [facility] tier 4 Referral Issue: COPD with 3 ED visits and 3 admits.  Per Epic MR: H/o [facility] Conditions: Chronic systolic HF, COPD, HTN, Other: A-fib.  Last admission: 05/22/15 - 05/24/15 CHF and discharged with home oxygen ([facility]). H/o patient discharged home with Daughter, [name] on last d/c date 05/24/2015.   Outreach call #2 to daughter, [name] for continued outreach attempts following 3 unsuccessful calls and letter to patient for [facility] Screening.  [address]  [address] [place] [address] [phone] home (incorrect) [phone] cell   Providers: Primary MD: Dr. Cain Saupe:  next appt: 07/2015 Cardiologist:  Dr. Shirlee Latch - states due to see MD and daughter will ask [name] to schedule appt on next MD visit.  HH: none Insurance: [employer]  Social: Patient lives in his home with his son who provides assistance.  Daughter states patient was living with her but wanted to return to his home.  Mobility: Ambulates independently  Falls: None  Pain: None reported.  Transportation: family Caregiver: Son and daughter, [name] DME: Home oxygen [facility]), eyeglasses  [facility] conditions:  Chronic systolic HF, COPD Other:  A-fib, HTN Admissions: 1  05/22/2015 -05/24/2015  Acute on chronic systolic congestive heart failure  ER visits: 2 Daughter states patient has been well managed and has had a healthier season than normal.   Medications:  Patient taking more than 10 medications  Co-pay cost issue: yes; Eliquis costing more than $100 for 30 day supply.  States past issues with seeking medication assistance and getting application in the mail.  States patient's care has been so demanding that daughter has not always had enough time to devote to paperwork application issues.  Daughter is  unable to recall if 30 day free trial provision was utilized.  [facility]  Flu Vaccine: 03/29/2014 Pneumonia Vaccine:  PCV13 03/29/2014 and PPSV2 01/06/2015.   Plan:  Referral Date:  05/26/2016 HIPAA Release 07/03/2015 from MD office.  Screening: 07/23/2015 Partial Initial Assessment initiated on 07/23/2015 [facility] Telephonic RN CM start date:  07/23/2015 Program:  CHF 07/23/2015  [facility] Referral  -more than 10 medications  -Contact:  Daughter, [name] or son, [name] in the home with patient provides medication assistance.  [facility] Administrative Assistant Referral for Eliquis Medication Assistance Application.  -RN CM will contact pharmacy to determine if 30 day free trial of Eliquis has been utilized and confirm co-pay cost.      RN CM notified [facility] Care Management Assistant: agreed to services/case opened:  Telephonic RN CM will keep patient.   RN CM sent successful outreach letter and  [facility] Introductory package. RN CM scheduled next contact call within the next 30 days for Initial Assessment.  RN CM advised to please notify MD of any changes in condition prior to scheduled appt's.   RN CM provided contact name and # [phone] or main office # [phone] and 24-hour nurse line # [phone].  RN CM confirmed patient is aware of 911 services for urgent emergency needs.  RN CM notified [facility] and requested Demographics update.   Please removed home # [phone] for daughter/[name].  Cell # is correct.   Donato Schultz, RN, BSN, [facility], CCM  [facility] [employer] Management Coordinator [phone] Direct [phone] Cell [phone] Office [phone] Fax

## 2015-07-24 ENCOUNTER — Ambulatory Visit: Payer: Self-pay

## 2015-07-25 ENCOUNTER — Ambulatory Visit: Payer: Self-pay

## 2015-07-26 ENCOUNTER — Ambulatory Visit: Payer: Self-pay

## 2015-07-26 NOTE — Patient Outreach (Signed)
[facility]) Care Management  07/26/2015  SRIRAM RHINEHART [birth year] [id number]   Telephone outreach to patient's daughter, [name] to complete the patient assistance application for Eliquis. I had to leave a HIPPA compliant message for her to call me back.  Lavell Supple L. Eward Rutigliano, AAS [facility] Care Management Assistant

## 2015-07-29 ENCOUNTER — Ambulatory Visit: Payer: Self-pay

## 2015-08-02 ENCOUNTER — Telehealth (HOSPITAL_COMMUNITY): Payer: Self-pay | Admitting: Pharmacist

## 2015-08-02 NOTE — Telephone Encounter (Signed)
[facility] At last visit, Dr. Shirlee Latch increased losartan and patient did not show up for BMET. He has an appointment next week, so have asked patient to continue losartan for now and will reassess if can switch at appointment.   Tyler Deis. Bonnye Fava, PharmD, BCPS, CPP Clinical Pharmacist Pager: [phone] Phone: [phone] 08/02/2015 10:27 AM

## 2015-08-05 ENCOUNTER — Ambulatory Visit: Payer: Self-pay

## 2015-08-06 ENCOUNTER — Ambulatory Visit: Payer: Self-pay

## 2015-08-06 ENCOUNTER — Other Ambulatory Visit: Payer: Self-pay

## 2015-08-06 NOTE — Patient Outreach (Signed)
[facility]) Care Management  08/06/2015  [name] 25-Sep-1945 [id number]   Outreach call #1 to patient's daughter, caregiver, Effie Shy.  Daughter states her father is doing just fine and does not understand why [facility] is involved in patient's care, who is paying for services and who made the referral.   RN CM attempted to review 07/23/2015 contact call with daughter; but daughter stated she was going to have a talk with patients cardiologist on the next appt scheduled 08/13/15 due to patient was previously taking only 5 medications and is now taking 10 medications.   RN CM attempted to clarify which cardiologist patient is scheduled to see on 08/13/15.  Daughter states "you should know this if you are connected to Dr. Chapman Fitch."  States she is tired of people calling her everyday to check on her father.  States "I take good care of my dad and if they think they can do any better, they need to come out and do it." RN CM unsuccessful with engagement today for planned initial assessment and continued services.  RN CM will close case (per daughters request); goals of care not met.   RN CM advised daughter to notify MD Dr. Chapman Fitch should new need arise and request new referral be sent to [facility].  RN CM will notify Dr. Chapman Fitch via case closure letter.   Mariann Laster, RN, BSN, [facility], CCM  [facility] [employer] Management Coordinator [phone] Direct [phone] Cell [phone] Office [phone] Fax

## 2015-08-07 ENCOUNTER — Encounter: Payer: Self-pay | Admitting: *Deleted

## 2015-08-13 ENCOUNTER — Ambulatory Visit (HOSPITAL_COMMUNITY)
Admission: RE | Admit: 2015-08-13 | Discharge: 2015-08-13 | Disposition: A | Discharge status: Home / Self Care | Payer: Medicare Other | Source: Ambulatory Visit | Attending: Cardiology | Admitting: Cardiology

## 2015-08-13 VITALS — BP 118/72 | HR 87 | Wt 162.5 lb

## 2015-08-13 DIAGNOSIS — Z8673 Personal history of transient ischemic attack (TIA), and cerebral infarction without residual deficits: Secondary | ICD-10-CM | POA: Diagnosis not present

## 2015-08-13 DIAGNOSIS — I482 Chronic atrial fibrillation, unspecified: Secondary | ICD-10-CM

## 2015-08-13 DIAGNOSIS — Z955 Presence of coronary angioplasty implant and graft: Secondary | ICD-10-CM | POA: Diagnosis not present

## 2015-08-13 DIAGNOSIS — F1010 Alcohol abuse, uncomplicated: Secondary | ICD-10-CM | POA: Diagnosis not present

## 2015-08-13 DIAGNOSIS — I739 Peripheral vascular disease, unspecified: Secondary | ICD-10-CM | POA: Insufficient documentation

## 2015-08-13 DIAGNOSIS — E785 Hyperlipidemia, unspecified: Secondary | ICD-10-CM | POA: Insufficient documentation

## 2015-08-13 DIAGNOSIS — J449 Chronic obstructive pulmonary disease, unspecified: Secondary | ICD-10-CM | POA: Insufficient documentation

## 2015-08-13 DIAGNOSIS — I110 Hypertensive heart disease with heart failure: Secondary | ICD-10-CM | POA: Diagnosis not present

## 2015-08-13 DIAGNOSIS — Z86718 Personal history of other venous thrombosis and embolism: Secondary | ICD-10-CM | POA: Diagnosis not present

## 2015-08-13 DIAGNOSIS — Z953 Presence of xenogenic heart valve: Secondary | ICD-10-CM | POA: Insufficient documentation

## 2015-08-13 DIAGNOSIS — I5022 Chronic systolic (congestive) heart failure: Secondary | ICD-10-CM | POA: Diagnosis present

## 2015-08-13 DIAGNOSIS — F17210 Nicotine dependence, cigarettes, uncomplicated: Secondary | ICD-10-CM | POA: Insufficient documentation

## 2015-08-13 DIAGNOSIS — I2510 Atherosclerotic heart disease of native coronary artery without angina pectoris: Secondary | ICD-10-CM | POA: Insufficient documentation

## 2015-08-13 DIAGNOSIS — Z95810 Presence of automatic (implantable) cardiac defibrillator: Secondary | ICD-10-CM | POA: Diagnosis not present

## 2015-08-13 DIAGNOSIS — Z7902 Long term (current) use of antithrombotics/antiplatelets: Secondary | ICD-10-CM | POA: Insufficient documentation

## 2015-08-13 DIAGNOSIS — I255 Ischemic cardiomyopathy: Secondary | ICD-10-CM

## 2015-08-13 DIAGNOSIS — Z79899 Other long term (current) drug therapy: Secondary | ICD-10-CM | POA: Insufficient documentation

## 2015-08-13 LAB — LIPID PANEL
Cholesterol: 125 mg/dL (ref 0–200)
HDL: 36 mg/dL — AB (ref >40)
LDL Cholesterol: 58 mg/dL (ref 0–99)
TRIGLYCERIDES: 155 mg/dL — AB (ref <150)
Total CHOL/HDL Ratio: 3.5 RATIO
VLDL: 31 mg/dL (ref 0–40)

## 2015-08-13 LAB — CBC
HCT: 49 % (ref 39.0–52.0)
Hemoglobin: 15.5 g/dL (ref 13.0–17.0)
MCH: 28.2 pg (ref 26.0–34.0)
MCHC: 31.6 g/dL (ref 30.0–36.0)
MCV: 89.1 fL (ref 78.0–100.0)
PLATELETS: 189 10*3/uL (ref 150–400)
RBC: 5.5 MIL/uL (ref 4.22–5.81)
RDW: 16.1 % — ABNORMAL HIGH (ref 11.5–15.5)
WBC: 10.1 10*3/uL (ref 4.0–10.5)

## 2015-08-13 LAB — BASIC METABOLIC PANEL
Anion gap: 15 (ref 5–15)
BUN: 15 mg/dL (ref 6–20)
CALCIUM: 9.9 mg/dL (ref 8.9–10.3)
CO2: 24 mmol/L (ref 22–32)
CREATININE: 1.32 mg/dL — AB (ref 0.61–1.24)
Chloride: 99 mmol/L — ABNORMAL LOW (ref 101–111)
GFR calc non Af Amer: 54 mL/min — ABNORMAL LOW (ref >60)
Glucose, Bld: 149 mg/dL — ABNORMAL HIGH (ref 65–99)
Potassium: 5.2 mmol/L — ABNORMAL HIGH (ref 3.5–5.1)
SODIUM: 138 mmol/L (ref 135–145)

## 2015-08-13 LAB — DIGOXIN LEVEL: DIGOXIN LVL: 1.2 ng/mL (ref 0.8–2.0)

## 2015-08-13 MED ORDER — LOSARTAN POTASSIUM 50 MG PO TABS: 50.0000 mg | ORAL_TABLET | Freq: Every day | ORAL | Status: DC

## 2015-08-13 NOTE — Patient Instructions (Signed)
Increase Losartan to 50 mg daily, we have sent you in a new prescription for 50 mg tablets  Labs today  Labs in 2 weeks  Your physician recommends that you schedule a follow-up appointment in: 6 weeks
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
## 2015-08-13 NOTE — Patient Outreach (Signed)
[facility]) Care Management  08/12/2015  RAESHON GANTT Mar 26, 1946 [id number]  Telephone outreach to patient's daughter, [name] to assist in completing the patient assistance application for Eliquis medication. Patient daughter did not want the assistance and requested that [facility] stops calling her. There are no further needs at this time so I'm removing myself from the care team.  Umi Mainor L. Erick Murin, AAS [facility] Care Management Assistant

## 2015-08-26 ENCOUNTER — Encounter: Payer: Self-pay | Admitting: Internal Medicine

## 2015-08-26 ENCOUNTER — Ambulatory Visit (HOSPITAL_COMMUNITY)
Admission: RE | Admit: 2015-08-26 | Discharge: 2015-08-26 | Disposition: A | Discharge status: Home / Self Care | Payer: Medicare Other | Source: Ambulatory Visit | Attending: Cardiology | Admitting: Cardiology

## 2015-08-26 DIAGNOSIS — R079 Chest pain, unspecified: Secondary | ICD-10-CM

## 2015-08-26 DIAGNOSIS — I5022 Chronic systolic (congestive) heart failure: Secondary | ICD-10-CM

## 2015-08-26 LAB — BASIC METABOLIC PANEL
Anion gap: 10 (ref 5–15)
BUN: 26 mg/dL — AB (ref 6–20)
CALCIUM: 9.5 mg/dL (ref 8.9–10.3)
CO2: 23 mmol/L (ref 22–32)
CREATININE: 1.17 mg/dL (ref 0.61–1.24)
Chloride: 104 mmol/L (ref 101–111)
GFR calc non Af Amer: >60 mL/min (ref >60)
Glucose, Bld: 221 mg/dL — ABNORMAL HIGH (ref 65–99)
Potassium: 4.2 mmol/L (ref 3.5–5.1)
SODIUM: 137 mmol/L (ref 135–145)

## 2015-08-26 LAB — DIGOXIN LEVEL: DIGOXIN LVL: 0.6 ng/mL — AB (ref 0.8–2.0)

## 2015-08-27 ENCOUNTER — Encounter (HOSPITAL_COMMUNITY): Payer: Self-pay

## 2015-08-27 ENCOUNTER — Emergency Department (HOSPITAL_COMMUNITY): Payer: Medicare Other

## 2015-08-27 ENCOUNTER — Emergency Department (HOSPITAL_COMMUNITY)
Admission: EM | Admit: 2015-08-27 | Discharge: 2015-08-27 | Disposition: A | Discharge status: Home / Self Care | Payer: Medicare Other | Attending: Emergency Medicine | Admitting: Emergency Medicine

## 2015-08-27 DIAGNOSIS — F17210 Nicotine dependence, cigarettes, uncomplicated: Secondary | ICD-10-CM | POA: Diagnosis not present

## 2015-08-27 DIAGNOSIS — R066 Hiccough: Secondary | ICD-10-CM | POA: Insufficient documentation

## 2015-08-27 DIAGNOSIS — R079 Chest pain, unspecified: Secondary | ICD-10-CM | POA: Diagnosis not present

## 2015-08-27 DIAGNOSIS — I10 Essential (primary) hypertension: Secondary | ICD-10-CM | POA: Diagnosis not present

## 2015-08-27 DIAGNOSIS — I5022 Chronic systolic (congestive) heart failure: Secondary | ICD-10-CM | POA: Insufficient documentation

## 2015-08-27 DIAGNOSIS — J449 Chronic obstructive pulmonary disease, unspecified: Secondary | ICD-10-CM | POA: Diagnosis not present

## 2015-08-27 DIAGNOSIS — I2510 Atherosclerotic heart disease of native coronary artery without angina pectoris: Secondary | ICD-10-CM | POA: Diagnosis not present

## 2015-08-27 LAB — CBC
HCT: 44.6 % (ref 39.0–52.0)
Hemoglobin: 14.1 g/dL (ref 13.0–17.0)
MCH: 27.8 pg (ref 26.0–34.0)
MCHC: 31.6 g/dL (ref 30.0–36.0)
MCV: 88 fL (ref 78.0–100.0)
PLATELETS: 170 10*3/uL (ref 150–400)
RBC: 5.07 MIL/uL (ref 4.22–5.81)
RDW: 15.9 % — ABNORMAL HIGH (ref 11.5–15.5)
WBC: 13.5 10*3/uL — AB (ref 4.0–10.5)

## 2015-08-27 LAB — BASIC METABOLIC PANEL
ANION GAP: 9 (ref 5–15)
BUN: 37 mg/dL — ABNORMAL HIGH (ref 6–20)
CALCIUM: 8.9 mg/dL (ref 8.9–10.3)
CO2: 25 mmol/L (ref 22–32)
CREATININE: 1.85 mg/dL — AB (ref 0.61–1.24)
Chloride: 98 mmol/L — ABNORMAL LOW (ref 101–111)
GFR calc non Af Amer: 36 mL/min — ABNORMAL LOW (ref >60)
GFR, EST AFRICAN AMERICAN: 41 mL/min — AB (ref >60)
Glucose, Bld: 493 mg/dL — ABNORMAL HIGH (ref 65–99)
Potassium: 5.3 mmol/L — ABNORMAL HIGH (ref 3.5–5.1)
SODIUM: 132 mmol/L — AB (ref 135–145)

## 2015-08-27 LAB — I-STAT TROPONIN, ED: TROPONIN I, POC: 0 ng/mL (ref 0.00–0.08)

## 2015-08-27 NOTE — ED Notes (Addendum)
pts son states that he is taking his father home tonight and will return in the morning. States they talked to his PCP and will have follow up.

## 2015-08-27 NOTE — ED Notes (Signed)
Onset 3 days pt has had hiccups and left sided chest pain when he hiccups. .  Pt reports that he feels like he stops breathing when he hiccups.  Daughter called PCP yesterday and was told to give Zantac, with no relief.  Pt currently on antibiotics and prednisone for cough.

## 2015-08-28 ENCOUNTER — Encounter: Payer: Self-pay | Admitting: Internal Medicine

## 2015-08-28 ENCOUNTER — Ambulatory Visit (INDEPENDENT_AMBULATORY_CARE_PROVIDER_SITE_OTHER): Payer: Medicare Other | Admitting: *Deleted

## 2015-08-28 ENCOUNTER — Telehealth: Payer: Self-pay | Admitting: Internal Medicine

## 2015-08-28 DIAGNOSIS — I5022 Chronic systolic (congestive) heart failure: Secondary | ICD-10-CM

## 2015-08-28 DIAGNOSIS — I482 Chronic atrial fibrillation, unspecified: Secondary | ICD-10-CM

## 2015-08-28 DIAGNOSIS — I255 Ischemic cardiomyopathy: Secondary | ICD-10-CM

## 2015-08-28 LAB — CUP PACEART INCLINIC DEVICE CHECK
Battery Voltage: 2.97 V
Brady Statistic AP VP Percent: 7.27 %
Brady Statistic AS VS Percent: 2.01 %
Brady Statistic RV Percent Paced: 96.78 %
HIGH POWER IMPEDANCE MEASURED VALUE: 47 Ohm
HighPow Impedance: 59 Ohm
Implantable Lead Implant Date: 20080110
Implantable Lead Location: 753859
Implantable Lead Location: 753860
Implantable Lead Model: 6947
Lead Channel Impedance Value: 4047 Ohm
Lead Channel Impedance Value: 437 Ohm
Lead Channel Impedance Value: 532 Ohm
Lead Channel Impedance Value: 589 Ohm
Lead Channel Pacing Threshold Amplitude: 0.625 V
Lead Channel Pacing Threshold Amplitude: 1.25 V
Lead Channel Pacing Threshold Pulse Width: 0.4 ms
Lead Channel Pacing Threshold Pulse Width: 0.4 ms
Lead Channel Sensing Intrinsic Amplitude: 1.375 mV
Lead Channel Setting Pacing Amplitude: 1.5 V
Lead Channel Setting Pacing Amplitude: 2.5 V
Lead Channel Setting Pacing Pulse Width: 0.4 ms
Lead Channel Setting Sensing Sensitivity: 0.45 mV
MDC IDC LEAD IMPLANT DT: 20060307
MDC IDC LEAD IMPLANT DT: 20090617
MDC IDC LEAD LOCATION: 753858
MDC IDC MSMT BATTERY REMAINING LONGEVITY: 79 mo
MDC IDC MSMT LEADCHNL LV IMPEDANCE VALUE: 4047 Ohm
MDC IDC MSMT LEADCHNL LV PACING THRESHOLD PULSEWIDTH: 0.4 ms
MDC IDC MSMT LEADCHNL RA IMPEDANCE VALUE: 589 Ohm
MDC IDC MSMT LEADCHNL RA SENSING INTR AMPL: 1.75 mV
MDC IDC MSMT LEADCHNL RV PACING THRESHOLD AMPLITUDE: 0.625 V
MDC IDC MSMT LEADCHNL RV SENSING INTR AMPL: 19 mV
MDC IDC MSMT LEADCHNL RV SENSING INTR AMPL: 19 mV
MDC IDC SESS DTM: 20170322133928
MDC IDC SET LEADCHNL LV PACING AMPLITUDE: 2.5 V
MDC IDC SET LEADCHNL LV PACING PULSEWIDTH: 0.4 ms
MDC IDC STAT BRADY AP VS PERCENT: 0.04 %
MDC IDC STAT BRADY AS VP PERCENT: 90.68 %
MDC IDC STAT BRADY RA PERCENT PACED: 7.31 %

## 2015-08-28 NOTE — Telephone Encounter (Signed)
New message    Pt daughter calling because dad has hiccups and wont stop  Offered first available she declined and she wants her dad seen today

## 2015-08-28 NOTE — Progress Notes (Signed)
CRT-D device check in office for hiccups. Randomly timed hiccups and belching noted throughout the device interrogation, but sensation was not reproduced with testing. Dr.Taylor spoke to patient about sx's and recommended that he follow up with his PCP for further eval.   Normal CRT-D function noted. Thresholds and sensing consistent with previous device measurements. Lead impedance trends stable over time. 90.3% AF burden--presently in AF + Eliquis. No ventricular arrhythmia episodes recorded. Patient bi-ventricularly pacing 99.9% of the time with 3.1% as VSRp. Device programmed with appropriate safety margins. Heart failure diagnostics reviewed and trends are stable for patient at present. No changes made this session. Estimated longevity 6.5 years. Patient will follow up with [facility] in 3 months.

## 2015-08-28 NOTE — Telephone Encounter (Signed)
lmom for daughter that Dr Ladona Ridgel could see today at 2:30 if needed.  Did not know had already been given 11:30 with device clinic

## 2015-08-28 NOTE — Telephone Encounter (Signed)
Spoke w/ pt daughter and she informed me that pt has had hiccups for 4 days. She has contacted pt PCP and has done everything that they told her to do and nothing has worked. Pt home monitor is at his home and pt is at his daughters home and is unable to send a remote transmission. After consulting w/ device clinic RN, I offered pt an appointment w/ device clinic for today at 11:30 AM. Pt daughter agreed to this appointment.

## 2015-09-03 ENCOUNTER — Encounter: Payer: Self-pay | Admitting: Family

## 2015-09-11 ENCOUNTER — Ambulatory Visit (HOSPITAL_COMMUNITY): Payer: Medicare Other | Attending: Family

## 2015-09-11 ENCOUNTER — Ambulatory Visit: Payer: Medicare Other | Admitting: Family

## 2015-09-19 ENCOUNTER — Telehealth (HOSPITAL_COMMUNITY): Payer: Self-pay | Admitting: *Deleted

## 2015-09-19 ENCOUNTER — Telehealth (HOSPITAL_COMMUNITY): Payer: Self-pay | Admitting: Pharmacist

## 2015-09-19 ENCOUNTER — Other Ambulatory Visit (HOSPITAL_COMMUNITY): Payer: Self-pay | Admitting: *Deleted

## 2015-09-19 NOTE — Telephone Encounter (Signed)
Prior authorization for Eliquis 5mg  approved through 06/07/2016  Sherle Poe, PharmD Clinical Pharmacy Resident 4:00 PM, 09/19/2015

## 2015-09-19 NOTE — Telephone Encounter (Signed)
Patients daughter called stating pt was out of eliquis and the pharmacy said he needed a PA. I asked her to verify the fax number they were sending the PA. Cicero Duck our pharmacist was speaking to the retail pharmacy at the same time and determined they were sending the PA to the wrong office. I offered her samples until Cicero Duck could work on [place] for pt. Pts daughter agreed to pick up samples. Samples left at front desk with Ines Bloomer.  Approximate time on phone .

## 2015-09-23 ENCOUNTER — Telehealth (HOSPITAL_COMMUNITY): Payer: Self-pay | Admitting: Cardiology

## 2015-09-23 NOTE — Telephone Encounter (Signed)
Patients daughter called to report during [facility] visit o2 sat was 86% on RA Nurse placed patient on o2 @ 2L and recovered to 94%  Patient currently only ordered to use oxygen at night  Advised daughter that patient should wear oxygen continuously until further evaluated at follow up on 4/18, provider can decide if order should change to continuous oxygen
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
## 2015-09-24 ENCOUNTER — Ambulatory Visit (HOSPITAL_COMMUNITY)
Admission: RE | Admit: 2015-09-24 | Discharge: 2015-09-24 | Disposition: A | Discharge status: Home / Self Care | Payer: Medicare Other | Source: Ambulatory Visit | Attending: Adult Health | Admitting: Adult Health

## 2015-09-24 VITALS — BP 112/68 | HR 87 | Wt 170.0 lb

## 2015-09-24 DIAGNOSIS — J449 Chronic obstructive pulmonary disease, unspecified: Secondary | ICD-10-CM | POA: Insufficient documentation

## 2015-09-24 DIAGNOSIS — Z720 Tobacco use: Secondary | ICD-10-CM

## 2015-09-24 DIAGNOSIS — I482 Chronic atrial fibrillation, unspecified: Secondary | ICD-10-CM

## 2015-09-24 DIAGNOSIS — Z950 Presence of cardiac pacemaker: Secondary | ICD-10-CM | POA: Diagnosis not present

## 2015-09-24 DIAGNOSIS — F17210 Nicotine dependence, cigarettes, uncomplicated: Secondary | ICD-10-CM | POA: Diagnosis not present

## 2015-09-24 DIAGNOSIS — F1010 Alcohol abuse, uncomplicated: Secondary | ICD-10-CM

## 2015-09-24 DIAGNOSIS — R0600 Dyspnea, unspecified: Secondary | ICD-10-CM

## 2015-09-24 DIAGNOSIS — I110 Hypertensive heart disease with heart failure: Secondary | ICD-10-CM | POA: Diagnosis not present

## 2015-09-24 DIAGNOSIS — R079 Chest pain, unspecified: Secondary | ICD-10-CM

## 2015-09-24 DIAGNOSIS — I5022 Chronic systolic (congestive) heart failure: Secondary | ICD-10-CM | POA: Diagnosis present

## 2015-09-24 DIAGNOSIS — I739 Peripheral vascular disease, unspecified: Secondary | ICD-10-CM | POA: Insufficient documentation

## 2015-09-24 DIAGNOSIS — Z79899 Other long term (current) drug therapy: Secondary | ICD-10-CM | POA: Diagnosis not present

## 2015-09-24 DIAGNOSIS — E785 Hyperlipidemia, unspecified: Secondary | ICD-10-CM | POA: Insufficient documentation

## 2015-09-24 DIAGNOSIS — I2510 Atherosclerotic heart disease of native coronary artery without angina pectoris: Secondary | ICD-10-CM | POA: Insufficient documentation

## 2015-09-24 DIAGNOSIS — Z955 Presence of coronary angioplasty implant and graft: Secondary | ICD-10-CM | POA: Diagnosis not present

## 2015-09-24 DIAGNOSIS — Z8249 Family history of ischemic heart disease and other diseases of the circulatory system: Secondary | ICD-10-CM | POA: Diagnosis not present

## 2015-09-24 DIAGNOSIS — Z953 Presence of xenogenic heart valve: Secondary | ICD-10-CM | POA: Diagnosis not present

## 2015-09-24 DIAGNOSIS — Z95810 Presence of automatic (implantable) cardiac defibrillator: Secondary | ICD-10-CM | POA: Insufficient documentation

## 2015-09-24 DIAGNOSIS — Z86718 Personal history of other venous thrombosis and embolism: Secondary | ICD-10-CM | POA: Insufficient documentation

## 2015-09-24 DIAGNOSIS — I428 Other cardiomyopathies: Secondary | ICD-10-CM | POA: Diagnosis not present

## 2015-09-24 DIAGNOSIS — Z8673 Personal history of transient ischemic attack (TIA), and cerebral infarction without residual deficits: Secondary | ICD-10-CM | POA: Insufficient documentation

## 2015-09-24 DIAGNOSIS — J438 Other emphysema: Secondary | ICD-10-CM

## 2015-09-24 DIAGNOSIS — Z7901 Long term (current) use of anticoagulants: Secondary | ICD-10-CM | POA: Insufficient documentation

## 2015-09-24 DIAGNOSIS — I059 Rheumatic mitral valve disease, unspecified: Secondary | ICD-10-CM

## 2015-09-24 LAB — BASIC METABOLIC PANEL
ANION GAP: 7 (ref 5–15)
BUN: 23 mg/dL — ABNORMAL HIGH (ref 6–20)
CALCIUM: 9.3 mg/dL (ref 8.9–10.3)
CHLORIDE: 102 mmol/L (ref 101–111)
CO2: 31 mmol/L (ref 22–32)
CREATININE: 1.11 mg/dL (ref 0.61–1.24)
GFR calc non Af Amer: >60 mL/min (ref >60)
Glucose, Bld: 204 mg/dL — ABNORMAL HIGH (ref 65–99)
Potassium: 4.6 mmol/L (ref 3.5–5.1)
SODIUM: 140 mmol/L (ref 135–145)

## 2015-09-24 LAB — DIGOXIN LEVEL: DIGOXIN LVL: 0.7 ng/mL — AB (ref 0.8–2.0)

## 2015-09-24 NOTE — Progress Notes (Signed)
Advanced Heart Failure Medication Review by a Pharmacist  Does the patient  feel that his/her medications are working for him/her?  yes  Has the patient been experiencing any side effects to the medications prescribed?  no  Does the patient measure his/her own blood pressure or blood glucose at home?  no   Does the patient have any problems obtaining medications due to transportation or finances?   no  Understanding of regimen: fair Understanding of indications: fair Potential of compliance: good Patient understands to avoid NSAIDs. Patient understands to avoid decongestants.   Pharmacist comments: Pt arrives with daughter who manages his care. Reports taking all morning doses prior to visit. Daughter reports patient visibly SOB when walking from car to grocery store. Usually falls asleep lying down however, frequently wakes and returns to sleep sitting up. COPD now on home O2. Continues to smoke daily. +2 LEE. Denies weighing at home. All medication issues addressed. No adverse effects reported.   Time with patient: Preparation and documentation time: Total time: 

## 2015-09-24 NOTE — Patient Instructions (Signed)
Labs today and again in 7-10 days  Be sure to increase lasix back to 40 mg twice a day  Your physician recommends that you schedule a follow-up appointment in: 4 weeks  Do the following things EVERYDAY: 1) Weigh yourself in the morning before breakfast. Write it down and keep it in a log. 2) Take your medicines as prescribed 3) Eat low salt foods-Limit salt (sodium) to 2000 mg per day.  4) Stay as active as you can everyday 5) Limit all fluids for the day to less than 2 liters 6)

## 2015-09-25 ENCOUNTER — Encounter: Payer: Self-pay | Admitting: Family

## 2015-09-26 ENCOUNTER — Telehealth (HOSPITAL_COMMUNITY): Payer: Self-pay | Admitting: Pharmacist

## 2015-09-26 NOTE — Telephone Encounter (Signed)
Eliquis 5 mg BID PA approved by [facility] Part D through 06/07/16.   Tyler Deis. Bonnye Fava, PharmD, BCPS, CPP Clinical Pharmacist Pager: [phone] Phone: [phone] 09/26/2015 4:02 PM

## 2015-10-01 ENCOUNTER — Encounter: Payer: Self-pay | Admitting: Internal Medicine

## 2015-10-01 IMAGING — DX DG CHEST 2V
2 series · 2 of 2 positions shown · non-contrast
Comparison: Chest radiograph performed 08/30/2014

CLINICAL DATA: Acute onset of shortness of breath. Initial
encounter.

EXAM:
CHEST  2 VIEW

[chest pa]
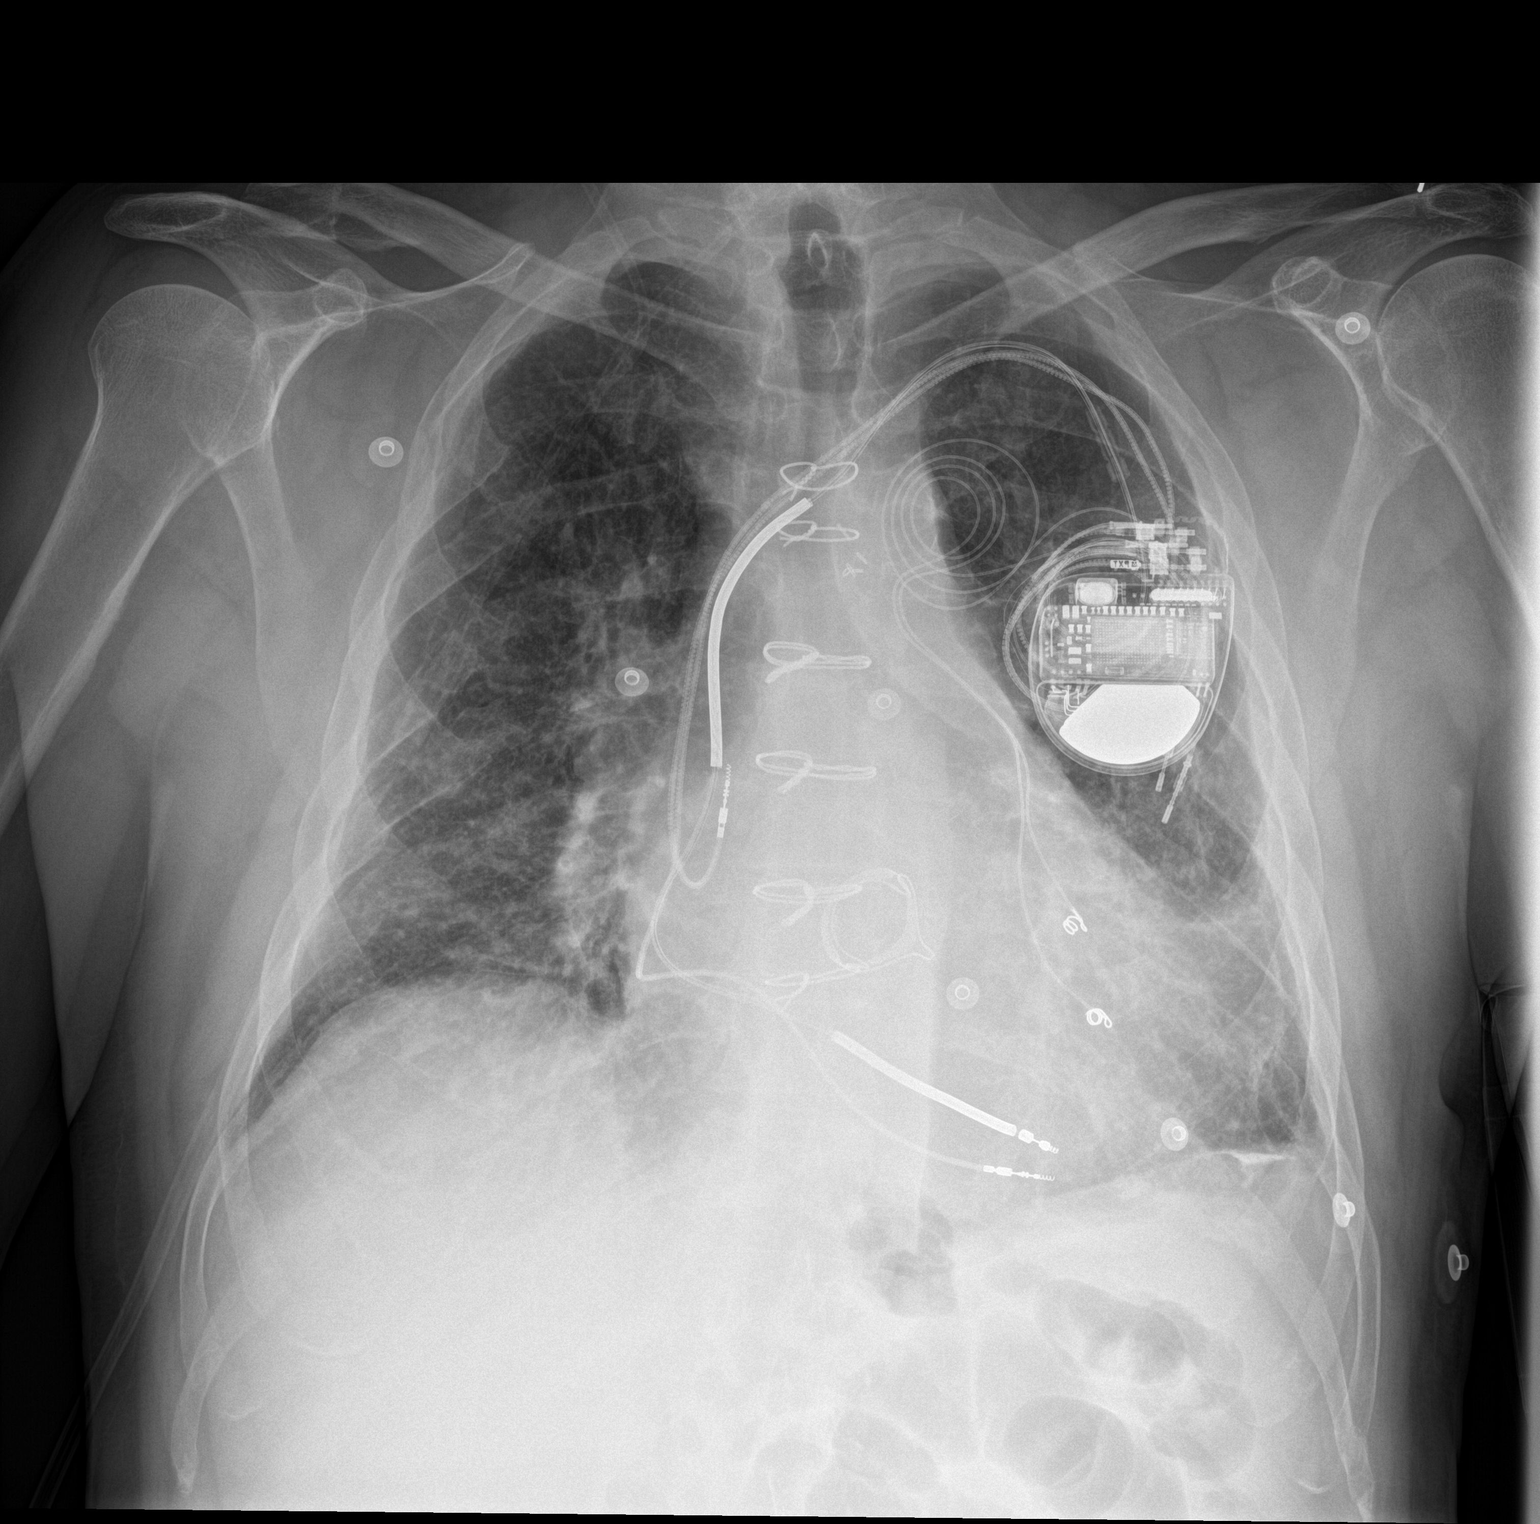

[chest lat]
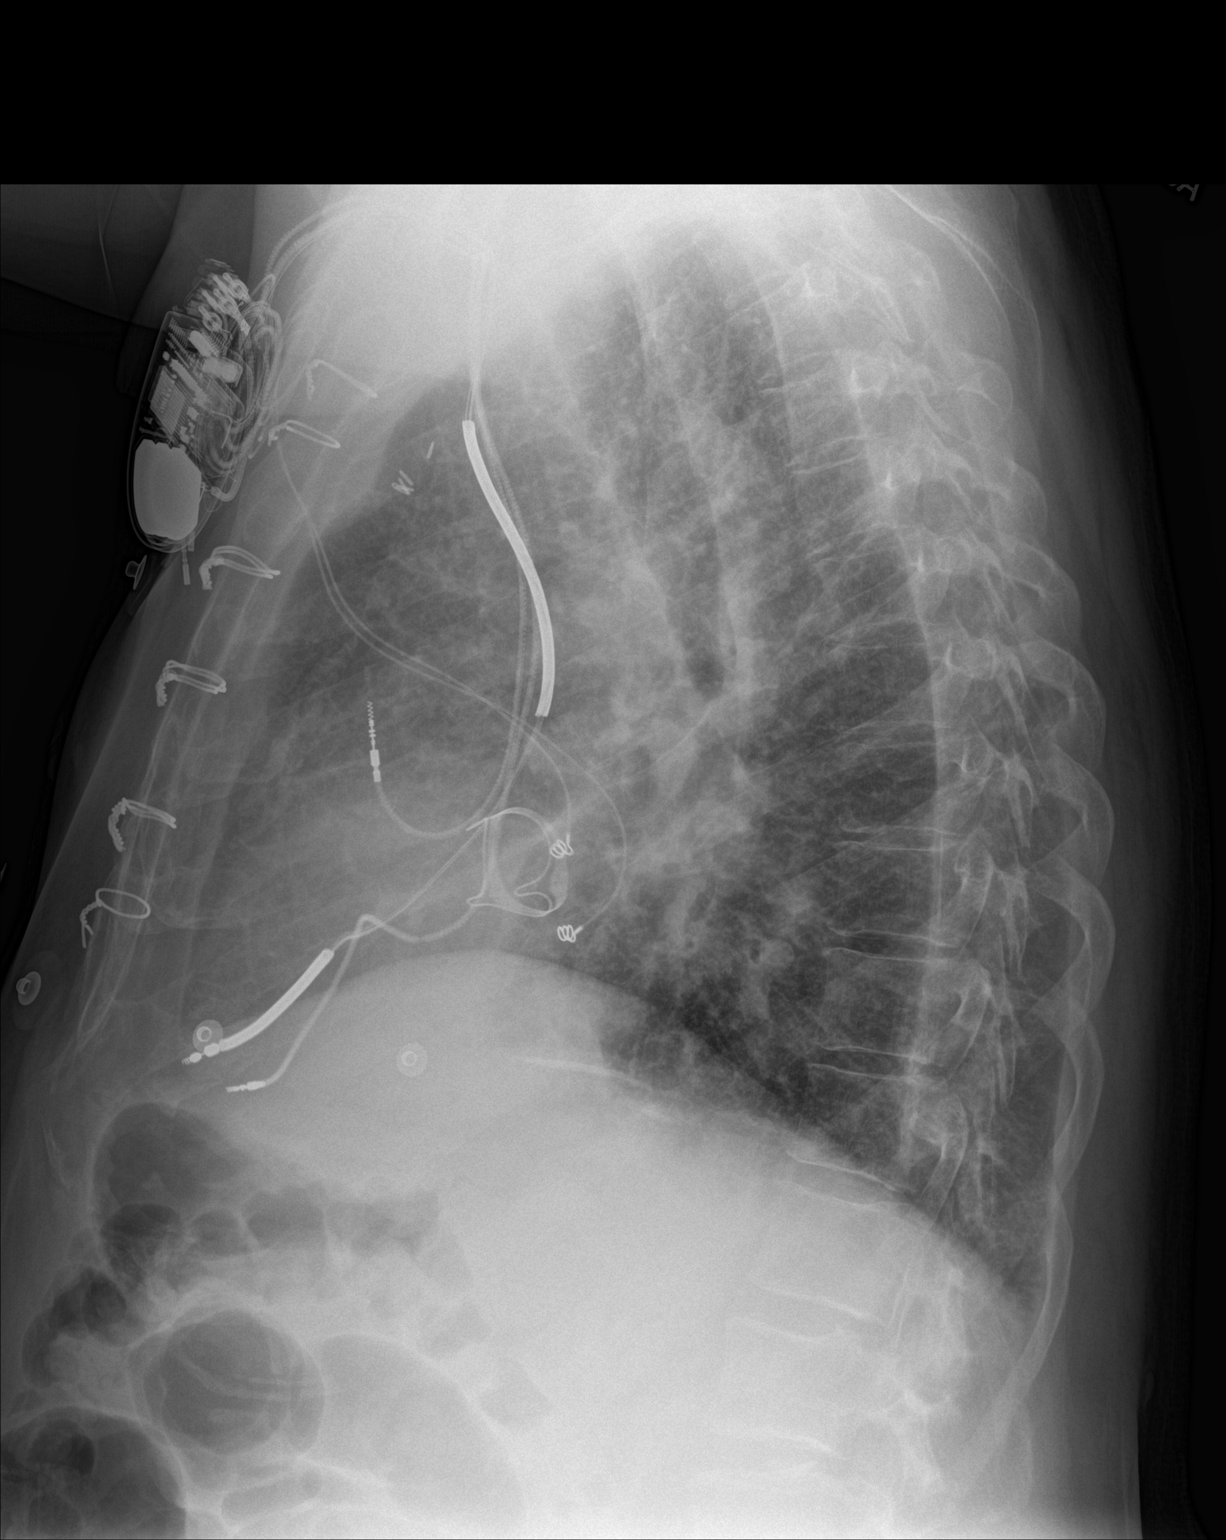

[2 of 2 positions shown; findings below may reference images not displayed]

FINDINGS: The lungs are well-aerated. Mild bibasilar opacities may reflect
atelectasis or possibly mild pneumonia. Peribronchial thickening is
noted. There is no evidence of pleural effusion or pneumothorax.

The heart is mildly enlarged. A pacemaker/AICD is noted at the left
chest wall, with leads ending at the right atrium and right
ventricle. The patient is status post median sternotomy. A valve
replacement is noted. No acute osseous abnormalities are seen.
IMPRESSION: Mild bibasilar opacities may reflect atelectasis or possibly mild
pneumonia. Mild cardiomegaly noted. Peribronchial thickening seen.

## 2015-10-02 ENCOUNTER — Ambulatory Visit (INDEPENDENT_AMBULATORY_CARE_PROVIDER_SITE_OTHER): Payer: Medicare Other | Admitting: Family

## 2015-10-02 ENCOUNTER — Other Ambulatory Visit: Payer: Self-pay | Admitting: Vascular Surgery

## 2015-10-02 ENCOUNTER — Ambulatory Visit (HOSPITAL_COMMUNITY)
Admission: RE | Admit: 2015-10-02 | Discharge: 2015-10-02 | Disposition: A | Discharge status: Home / Self Care | Payer: Medicare Other | Source: Ambulatory Visit | Attending: Family | Admitting: Family

## 2015-10-02 ENCOUNTER — Encounter: Payer: Self-pay | Admitting: Family

## 2015-10-02 VITALS — BP 125/76 | HR 63 | Ht 67.0 in | Wt 167.5 lb

## 2015-10-02 DIAGNOSIS — Z720 Tobacco use: Secondary | ICD-10-CM

## 2015-10-02 DIAGNOSIS — I739 Peripheral vascular disease, unspecified: Secondary | ICD-10-CM | POA: Diagnosis not present

## 2015-10-02 DIAGNOSIS — I70209 Unspecified atherosclerosis of native arteries of extremities, unspecified extremity: Secondary | ICD-10-CM

## 2015-10-02 DIAGNOSIS — I110 Hypertensive heart disease with heart failure: Secondary | ICD-10-CM | POA: Insufficient documentation

## 2015-10-02 DIAGNOSIS — F17200 Nicotine dependence, unspecified, uncomplicated: Secondary | ICD-10-CM

## 2015-10-02 DIAGNOSIS — I5022 Chronic systolic (congestive) heart failure: Secondary | ICD-10-CM | POA: Diagnosis not present

## 2015-10-02 DIAGNOSIS — E785 Hyperlipidemia, unspecified: Secondary | ICD-10-CM | POA: Insufficient documentation

## 2015-10-02 NOTE — Progress Notes (Signed)
[facility] [place]  Patient name: [name] MRN: [id number] DOB: 21-Jun-1945 Sex: male  REASON FOR VISIT: Follow up peripheral artery disease  HPI: [name] is a 70 y.o. male patient of Dr. Edilia Bo who returns for 1 year follow up of his infrainguinal arterial occlusive disease bilaterally.  He denies any history of claudication or rest pain. However, his daughter states he walks very little. He denies non healing wounds in his feet/legs.   He has a history of significant congestive heart failure with an EF of 20% and was told not to perform any strenuous activity. Unfortunately, he tells me that he smokes 1 pack per day of cigarettes. He used to work for a tobacco company.  Daughter states pt's urine recently tested positive for glucose and he will have testing for DM soon.   He takes Eliquis instead of coumadin, took coumadin since he was 70 years old when he had his first CABG, also had a CABG in 2008. He has a history of atrial fib.   Past Medical History  Diagnosis Date  . DVT (deep venous thrombosis) (HCC)   . Mitral valve disease     a. remote mitral replacement with Bjork Shiley valve;  b. 06/2006 Redo MVR with tissue valve.  Marland Kitchen COPD (chronic obstructive pulmonary disease) (HCC)   . Depression   . Hypertension   . Dyslipidemia   . Ventral hernia   . CAD (coronary artery disease)     a. s/p prior PCI/stenting of the LCX;  b. 08/2014 MV: EF 20%, large septal, apical, and inferior infarct from apex to base, no ischemia;  c. 08/2014 NSTEMI/Cath: LM mod distal dzs extending into ostial LCX (80%), LAD tortuous, RI nl, OM mod dzs, RCA 100 CTO with R->R and L->R collats-->Med Rx.  . Mixed Ischemic and Nonischemic Cardiomyopathy     a. 8/.2015 Echo: Ef 25-30%;  b. 01/2014 s/p MDT [id number] [employer] Grumbling XT CRT-D (ser # [id number]).  . Chronic systolic congestive heart failure (HCC)     a. 01/2014 Echo: EF 25-30%, mod conc LVH, mod dil LA.  . Bipolar disorder (HCC)    . Anxiety   . Persistent atrial fibrillation (HCC)     a. CHA2DS2VASc = 6->eliquis;  b. S/P AVN RFCA an BiV ICD placement.  . Alcohol abuse   . Complete heart block (HCC)     a. In setting of prior AV nodal ablation r/t afib-->BiV ICD (01/2014).  . Stroke (HCC)   . CVA (cerebral vascular accident) (HCC)     a. Multiple prior embolic strokes.  . Biventricular automatic implantable cardioverter defibrillator in situ     a. 01/2014 s/p MDT [id number] Viva XT CRT-D (ser # [id number]).    Family History  Problem Relation Age of Onset  . Stroke    . Heart disease    . Alzheimer's disease Mother   . Heart attack Father   . Heart disease Father   . Heart attack Son   . Varicose Veins Son   . Deep vein thrombosis Son   . Heart disease Son   . Heart disease Brother     SOCIAL HISTORY: Social History  Substance Use Topics  . Smoking status: Current Every Day Smoker -- 1.00 packs/day    Types: Cigarettes    Last Attempt to Quit: 01/30/2003  . Smokeless tobacco: Never Used  . Alcohol Use: 1.2 oz/week    2 Cans of beer, 0 Standard drinks or equivalent per  week     Comment: occasionally    Allergies  Allergen Reactions  . Warfarin Other (See Comments)    Non compliance and ETOH abuse    Current Outpatient Prescriptions  Medication Sig Dispense Refill  . apixaban (ELIQUIS) 5 MG TABS tablet Take 1 tablet (5 mg total) by mouth 2 (two) times daily. 60 tablet 3  . atorvastatin (LIPITOR) 20 MG tablet TAKE 1 TABLET BY MOUTH EVERY DAY 30 tablet 3  . carvedilol (COREG) 6.25 MG tablet Take 6.25 mg by mouth 2 (two) times daily with a meal.    . digoxin (LANOXIN) 0.125 MG tablet Take 1 tablet (0.125 mg total) by mouth daily. 90 tablet 3  . fenofibrate (TRICOR) 48 MG tablet Take 1 tablet (48 mg total) by mouth daily. Reported on 05/28/2015 30 tablet 6  . furosemide (LASIX) 20 MG tablet Take 2 tablets (40 mg total) by mouth 2 (two) times daily. 120 tablet 6  . loratadine (CLARITIN) 10 MG tablet  Take 10 mg by mouth daily as needed for allergies.    Marland Kitchen losartan (COZAAR) 50 MG tablet Take 1 tablet (50 mg total) by mouth daily. 30 tablet 3  . Multiple Vitamin (MULTIVITAMIN WITH MINERALS) TABS tablet Take 1 tablet by mouth daily. 30 tablet 2  . nitroGLYCERIN (NITROSTAT) 0.4 MG SL tablet Place 1 tablet (0.4 mg total) under the tongue every 5 (five) minutes as needed for chest pain. 25 tablet 2  . spironolactone (ALDACTONE) 25 MG tablet Take 25 mg by mouth daily.    Marland Kitchen thiamine 100 MG tablet Take 1 tablet (100 mg total) by mouth daily. 30 tablet 0  . trimethoprim-polymyxin b (POLYTRIM) ophthalmic solution Place 1 drop into both eyes every 4 (four) hours. Reported on 10/02/2015     No current facility-administered medications for this visit.    REVIEW OF SYSTEMS: [X]  denotes positive finding, [ ]  denotes negative finding Cardiac  Comments:  Chest pain or chest pressure: no   Shortness of breath upon exertion: X   Short of breath when lying flat: X   Irregular heart rhythm:        Vascular:    Pain in calf, thigh, or hip brought on by ambulation:    Pain in feet at night that wakes you up from your sleep:     Blood clot in your veins:    Leg swelling:         Pulmonary    Oxygen at home: X  24/7  Productive cough:     Wheezing:         Neurologic    Sudden weakness in arms or legs:     Sudden numbness in arms or legs:     Sudden onset of difficulty speaking or slurred speech:    Temporary loss of vision in one eye:     Problems with dizziness:         Gastrointestinal    Blood in stool:     Vomited blood:         Genitourinary    Burning when urinating:     Blood in urine:        Psychiatric    Major depression:         Hematologic    Bleeding problems:    Problems with blood clotting too easily:        Skin    Rashes or ulcers:        Constitutional    Fever or chills:  PHYSICAL EXAM: Filed Vitals:   10/02/15 1314  BP: 125/76  Pulse: 63  Height: 5'  7" (1.702 m)  Weight: 167 lb 8 oz (75.978 kg)  SpO2: 89%  Body mass index is 26.23 kg/(m^2).   GENERAL: The patient is a well-nourished male, in no acute distress. The vital signs are documented above. CARDIAC: There is a regular rate and rhythm. + murmur. Pacemaker/defibrillator subcutaneous left upper chest. VASCULAR: Bilateral femoral pulses are palpable. Bilateral popliteal pulses are not palpable. Bilateral pedal pulses are not palpable. No signs of ischemia in his feet/legs. PULMONARY: There is fair air exchange bilaterally without wheezing or rales. He has an occasional moist cough. ABDOMEN: Soft and non-tender with normal pitched bowel sounds.  MUSCULOSKELETAL: There are no major deformities or cyanosis. NEUROLOGIC: No focal weakness or paresthesias are detected. He has significant hearing loss.  SKIN: There are no ulcers or rashes noted. PSYCHIATRIC: The patient has a normal affect.   DATA:  ABI (Date: 10/02/2015)  R: 0.73 (0.65, 09/05/14), DP: monophasic, PT: monophasic, TBI: 0.28  L: 0.68 (0.60), DP: monophasic, PT: monophasic, TBI: 0.38   MEDICAL ISSUES:  Peripheral Artery Occlusive Disease: ABI's today indicate stable moderate bilateral arterial occlusive disease. He has no signs of ischemia in his feet/legs. He does not seem to walk enough to elicit claudication, but he does walk around his house.  Daily seated leg exercises discussed with pt and daughter and demonstrated.  Dr. Edilia Bo indicates in his assessment dated 09/05/14 that pt would be at high-risk for intervention given his cardiac history.  Active Smoking: This remains his primary atherosclerotic risk factor. Fortunately he does not have known DM, but glucose was recently detected in his urine and he will be tested soon for DM. The patient and his daughter were counseled re smoking cessation and given several free resources re smoking cessation.      [name], Carma Lair, RN, MSN, FNP-C [facility]

## 2015-10-04 ENCOUNTER — Ambulatory Visit (HOSPITAL_COMMUNITY)
Admission: RE | Admit: 2015-10-04 | Discharge: 2015-10-04 | Disposition: A | Discharge status: Home / Self Care | Payer: Medicare Other | Source: Ambulatory Visit | Attending: Cardiology | Admitting: Cardiology

## 2015-10-04 DIAGNOSIS — I5022 Chronic systolic (congestive) heart failure: Secondary | ICD-10-CM | POA: Diagnosis not present

## 2015-10-04 LAB — BASIC METABOLIC PANEL
ANION GAP: 10 (ref 5–15)
BUN: 37 mg/dL — ABNORMAL HIGH (ref 6–20)
CALCIUM: 9.6 mg/dL (ref 8.9–10.3)
CO2: 28 mmol/L (ref 22–32)
CREATININE: 1.43 mg/dL — AB (ref 0.61–1.24)
Chloride: 104 mmol/L (ref 101–111)
GFR, EST AFRICAN AMERICAN: 57 mL/min — AB (ref >60)
GFR, EST NON AFRICAN AMERICAN: 49 mL/min — AB (ref >60)
Glucose, Bld: 237 mg/dL — ABNORMAL HIGH (ref 65–99)
Potassium: 5.1 mmol/L (ref 3.5–5.1)
SODIUM: 142 mmol/L (ref 135–145)

## 2015-10-11 ENCOUNTER — Encounter: Payer: Self-pay | Admitting: Internal Medicine

## 2015-10-11 ENCOUNTER — Ambulatory Visit (INDEPENDENT_AMBULATORY_CARE_PROVIDER_SITE_OTHER): Payer: Medicare Other | Admitting: Internal Medicine

## 2015-10-11 VITALS — BP 118/80 | HR 65 | Ht 67.0 in | Wt 168.0 lb

## 2015-10-11 DIAGNOSIS — J9611 Chronic respiratory failure with hypoxia: Secondary | ICD-10-CM | POA: Diagnosis not present

## 2015-10-11 DIAGNOSIS — Z720 Tobacco use: Secondary | ICD-10-CM

## 2015-10-11 DIAGNOSIS — F17210 Nicotine dependence, cigarettes, uncomplicated: Secondary | ICD-10-CM | POA: Diagnosis not present

## 2015-10-11 DIAGNOSIS — R0600 Dyspnea, unspecified: Secondary | ICD-10-CM

## 2015-10-11 MED ORDER — UMECLIDINIUM-VILANTEROL 62.5-25 MCG/INH IN AEPB: INHALATION_SPRAY | RESPIRATORY_TRACT | Status: DC

## 2015-10-11 NOTE — Patient Instructions (Addendum)
Try anoro 1 click each am - fill the prescription if helping   Try 02 2lpm with walking and sleeping, no need at rest  The key is to stop smoking completely before smoking completely stops you!   Please see patient coordinator before you leave today  to schedule BEST FIT per advanced for your portable 02   Follow up with Dr Vassie Loll in 6-8 weeks
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
## 2015-10-12 ENCOUNTER — Encounter: Payer: Self-pay | Admitting: Internal Medicine

## 2015-10-12 IMAGING — DX DG CHEST 1V PORT
1 series · 1 of 1 positions shown · non-contrast
Comparison: 12/24/2014

CLINICAL DATA: Chest pain and shortness of breath tonight.

EXAM:
PORTABLE CHEST - 1 VIEW

[chest ap]
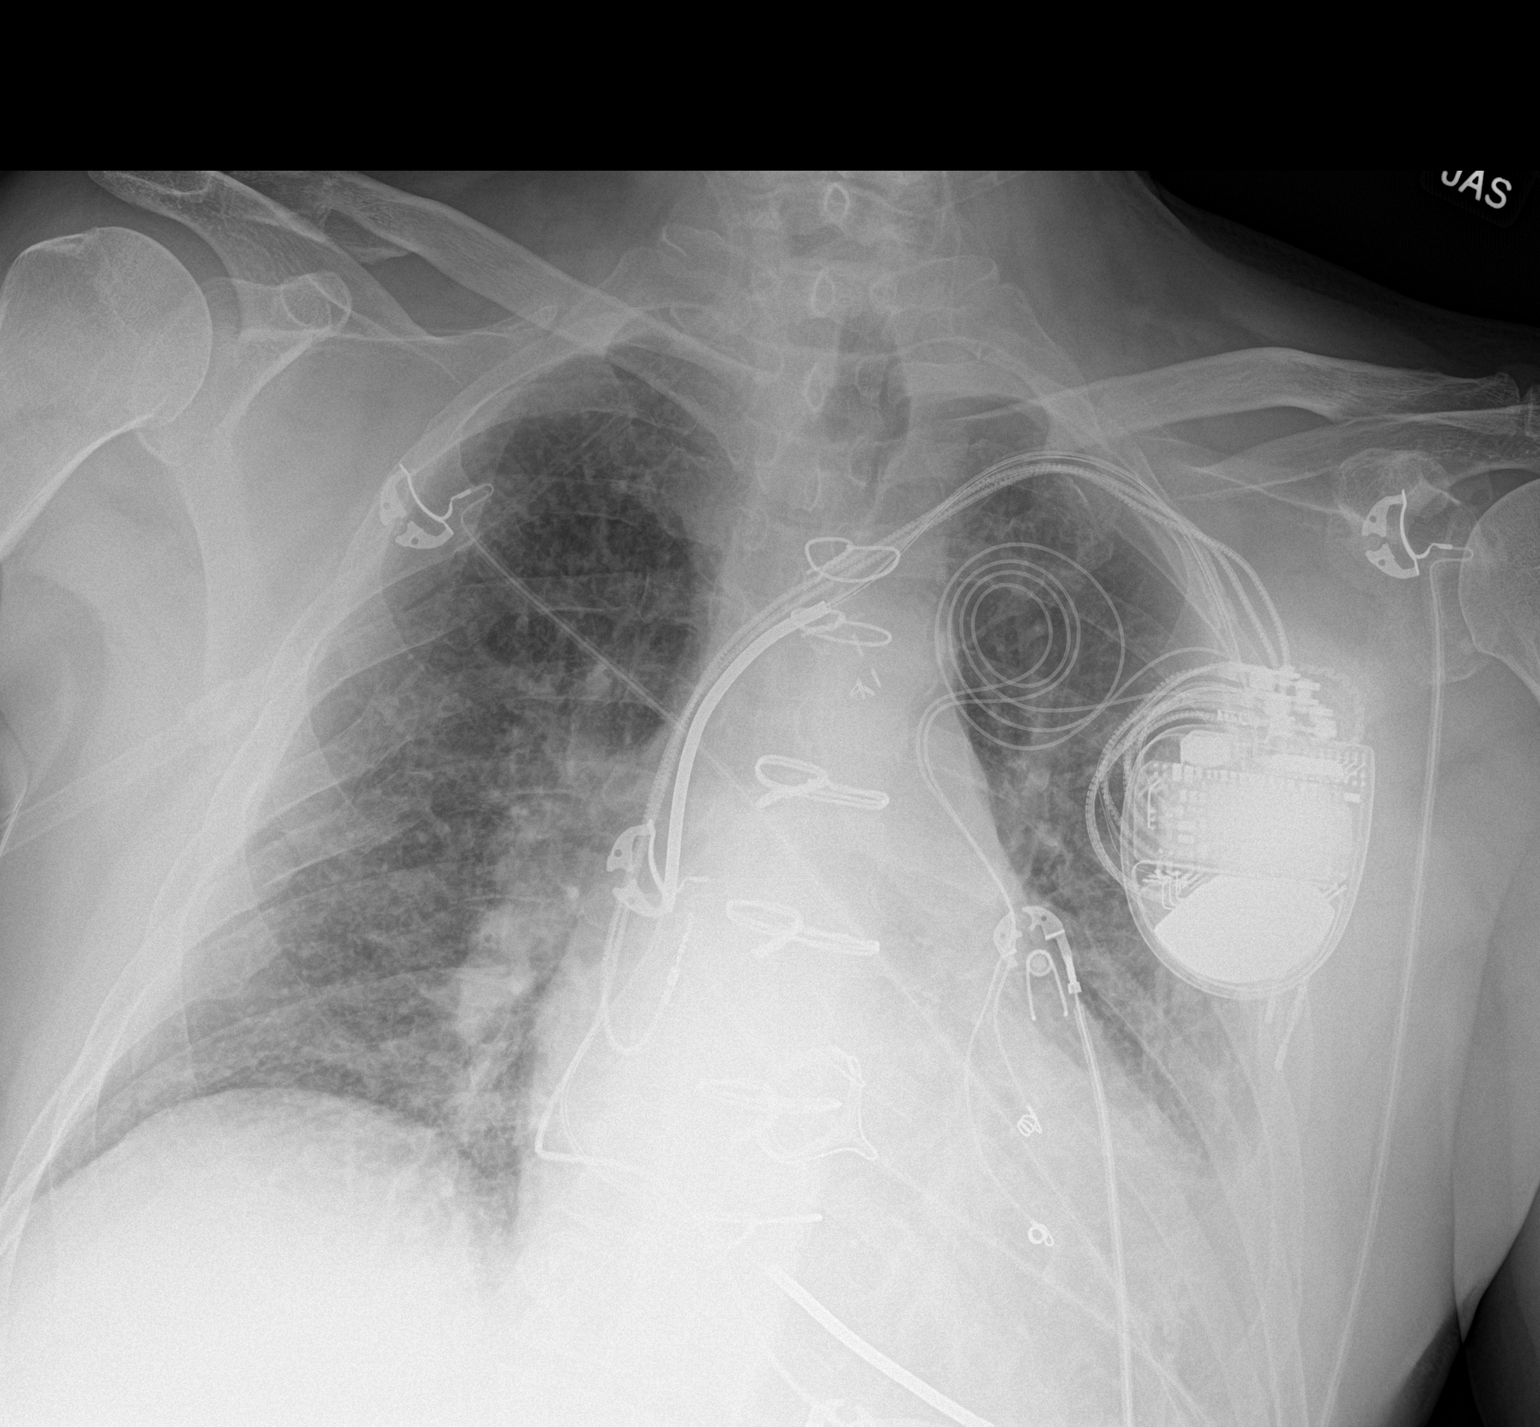

[1 of 1 positions shown; findings below may reference images not displayed]

FINDINGS: Sternotomy wires and left-sided pacemaker are unchanged. Prostatic
are valve unchanged. Lungs are adequately inflated and demonstrate
mild prominence of the perihilar markings with mild hazy left base
opacification. Mild stable cardiomegaly. Remainder of the exam is
unchanged.
IMPRESSION: Mild cardiomegaly with findings suggesting minimal interstitial
edema. Cannot exclude an effusion/atelectasis versus infection in
the left base.

## 2015-10-12 NOTE — Assessment & Plan Note (Signed)
10/11/2015 referred for [facility] for amb 02   Warned re smoking/ 02 risk

## 2015-10-12 NOTE — Assessment & Plan Note (Addendum)
Spirometry 10/11/2015  FEV1 1.06 (33%)  Ratio 75 but Exp time < 6 sec and some concavity on effort indep portion of f/v loop so doesn't techically meet the criteria for copd and Symptoms are markedly disproportionate to objective findings and not clear this is all a  lung problem but pt does appear to have difficult airway management issues. DDX of  difficult airways management almost all start with A and  include Adherence, Ace Inhibitors, Acid Reflux, Active Sinus Disease, Alpha 1 Antitripsin deficiency, Anxiety masquerading as Airways dz,  ABPA,  Allergy(esp in young), Aspiration (esp in elderly), Adverse effects of meds,  Active smokers, A bunch of PE's (a small clot burden can't cause this syndrome unless there is already severe underlying pulm or vascular dz with poor reserve) plus two Bs  = Bronchiectasis and Beta blocker use..and one C= CHF  Adherence is always the initial "prime suspect" and is a multilayered concern that requires a "trust but verify" approach in every patient - starting with knowing how to use medications, especially inhalers, correctly, keeping up with refills and understanding the fundamental difference between maintenance and prns vs those medications only taken for a very short course and then stopped and not refilled.   - The proper method of use, as well as anticipated side effects, of a metered-dose inhaler are discussed and demonstrated to the patient. Improved effectiveness after extensive coaching during this visit to a level of approximately 90 % from a baseline of 75 % with dry powder /elipta device - if not better on anoro it's probably because he's not really airflow limited  Active smoking also at top of list (see separate a/p)   ? Allergies/ asthma > no h/o cough to suggest this/ ok to continue clariton prn  ? A bunch of PE's > on eliquis so not likely  ? BB > concerned with coreg but in low doses probably ok   ? chf > active rx per CHF clinic/ appears euvolemic  at present > reg f/u already planned   Also not clear how much better his activity tol would be is uses adequate 02 with activities >  See resp failure a/p  Total time devoted to counseling  = 35/59m review case with pt/daughter discussion of options/alternatives/ personally creating in presence of pt  then going over specific  Instructions directly with the pt including how to use all of the meds but in particular covering each new medication in detail (see avs)

## 2015-10-12 NOTE — Assessment & Plan Note (Signed)
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
## 2015-10-23 ENCOUNTER — Ambulatory Visit (HOSPITAL_COMMUNITY)
Admission: RE | Admit: 2015-10-23 | Discharge: 2015-10-23 | Disposition: A | Discharge status: Home / Self Care | Payer: Medicare Other | Source: Ambulatory Visit | Attending: Internal Medicine | Admitting: Internal Medicine

## 2015-10-23 VITALS — BP 112/68 | HR 84 | Wt 171.8 lb

## 2015-10-23 DIAGNOSIS — I482 Chronic atrial fibrillation: Secondary | ICD-10-CM | POA: Insufficient documentation

## 2015-10-23 DIAGNOSIS — Z720 Tobacco use: Secondary | ICD-10-CM

## 2015-10-23 DIAGNOSIS — Z8673 Personal history of transient ischemic attack (TIA), and cerebral infarction without residual deficits: Secondary | ICD-10-CM | POA: Insufficient documentation

## 2015-10-23 DIAGNOSIS — J449 Chronic obstructive pulmonary disease, unspecified: Secondary | ICD-10-CM | POA: Diagnosis not present

## 2015-10-23 DIAGNOSIS — Z953 Presence of xenogenic heart valve: Secondary | ICD-10-CM | POA: Insufficient documentation

## 2015-10-23 DIAGNOSIS — I110 Hypertensive heart disease with heart failure: Secondary | ICD-10-CM | POA: Diagnosis not present

## 2015-10-23 DIAGNOSIS — Z8249 Family history of ischemic heart disease and other diseases of the circulatory system: Secondary | ICD-10-CM | POA: Diagnosis not present

## 2015-10-23 DIAGNOSIS — I2510 Atherosclerotic heart disease of native coronary artery without angina pectoris: Secondary | ICD-10-CM | POA: Diagnosis not present

## 2015-10-23 DIAGNOSIS — I5022 Chronic systolic (congestive) heart failure: Secondary | ICD-10-CM | POA: Diagnosis not present

## 2015-10-23 DIAGNOSIS — F17210 Nicotine dependence, cigarettes, uncomplicated: Secondary | ICD-10-CM | POA: Diagnosis not present

## 2015-10-23 DIAGNOSIS — E785 Hyperlipidemia, unspecified: Secondary | ICD-10-CM | POA: Diagnosis not present

## 2015-10-23 DIAGNOSIS — Z79899 Other long term (current) drug therapy: Secondary | ICD-10-CM | POA: Insufficient documentation

## 2015-10-23 DIAGNOSIS — Z95810 Presence of automatic (implantable) cardiac defibrillator: Secondary | ICD-10-CM | POA: Insufficient documentation

## 2015-10-23 DIAGNOSIS — Z7901 Long term (current) use of anticoagulants: Secondary | ICD-10-CM | POA: Insufficient documentation

## 2015-10-23 DIAGNOSIS — Z0389 Encounter for observation for other suspected diseases and conditions ruled out: Secondary | ICD-10-CM

## 2015-10-23 DIAGNOSIS — F1010 Alcohol abuse, uncomplicated: Secondary | ICD-10-CM | POA: Insufficient documentation

## 2015-10-23 DIAGNOSIS — I739 Peripheral vascular disease, unspecified: Secondary | ICD-10-CM | POA: Insufficient documentation

## 2015-10-23 DIAGNOSIS — Z86718 Personal history of other venous thrombosis and embolism: Secondary | ICD-10-CM | POA: Diagnosis not present

## 2015-10-23 DIAGNOSIS — Z4502 Encounter for adjustment and management of automatic implantable cardiac defibrillator: Secondary | ICD-10-CM

## 2015-10-23 LAB — BASIC METABOLIC PANEL
ANION GAP: 8 (ref 5–15)
BUN: 11 mg/dL (ref 6–20)
CHLORIDE: 102 mmol/L (ref 101–111)
CO2: 27 mmol/L (ref 22–32)
Calcium: 9.3 mg/dL (ref 8.9–10.3)
Creatinine, Ser: 1.05 mg/dL (ref 0.61–1.24)
GFR calc non Af Amer: >60 mL/min (ref >60)
Glucose, Bld: 123 mg/dL — ABNORMAL HIGH (ref 65–99)
POTASSIUM: 4.7 mmol/L (ref 3.5–5.1)
SODIUM: 137 mmol/L (ref 135–145)

## 2015-10-23 MED ORDER — FUROSEMIDE 20 MG PO TABS
40.0000 mg | ORAL_TABLET | Freq: Two times a day (BID) | ORAL | Status: DC
Start: 2015-10-23 — End: 2016-03-10

## 2015-10-23 MED ORDER — SPIRONOLACTONE 25 MG PO TABS: 25.0000 mg | ORAL_TABLET | Freq: Every day | ORAL | Status: DC

## 2015-10-23 MED ORDER — ATORVASTATIN CALCIUM 20 MG PO TABS: 20.0000 mg | ORAL_TABLET | Freq: Every day | ORAL | Status: DC

## 2015-10-23 MED ORDER — LOSARTAN POTASSIUM 50 MG PO TABS: 50.0000 mg | ORAL_TABLET | Freq: Every day | ORAL | Status: DC

## 2015-10-23 MED ORDER — APIXABAN 5 MG PO TABS: 5.0000 mg | ORAL_TABLET | Freq: Two times a day (BID) | ORAL | Status: DC

## 2015-10-23 MED ORDER — CARVEDILOL 6.25 MG PO TABS: 6.2500 mg | ORAL_TABLET | Freq: Two times a day (BID) | ORAL | Status: DC

## 2015-10-23 MED ORDER — FENOFIBRATE 48 MG PO TABS: 48.0000 mg | ORAL_TABLET | Freq: Every day | ORAL | Status: DC

## 2015-10-23 MED ORDER — DIGOXIN 125 MCG PO TABS: 0.1250 mg | ORAL_TABLET | Freq: Every day | ORAL | Status: DC

## 2015-10-23 NOTE — Progress Notes (Signed)
Advanced Heart Failure Medication Review by a Pharmacist  Does the patient  feel that his/her medications are working for him/her?  yes  Has the patient been experiencing any side effects to the medications prescribed?  no  Does the patient measure his/her own blood pressure or blood glucose at home?  no   Does the patient have any problems obtaining medications due to transportation or finances?   no  Understanding of regimen: fair Understanding of indications: fair Potential of compliance: good Patient understands to avoid NSAIDs. Patient understands to avoid decongestants.  Issues to address at subsequent visits: None   Pharmacist comments:  [name] is a pleasant 70 yo M presenting with his daughter and without a medication list. His daughter manages his medications and reports good compliance. They did not have any specific medication-related questions or concerns for me at this time.   Tyler Deis. Bonnye Fava, PharmD, BCPS, CPP Clinical Pharmacist Pager: [phone] Phone: [phone] 10/23/2015 2:17 PM      Time with patient: 10 minutes Preparation and documentation time: 2 minutes Total time: 12 minutes

## 2015-10-23 NOTE — Patient Instructions (Signed)
Labs today  Your physician recommends that you schedule a follow-up appointment in: 6 weeks with Dr.McLean  Do the following things EVERYDAY: 1) Weigh yourself in the morning before breakfast. Write it down and keep it in a log. 2) Take your medicines as prescribed 3) Eat low salt foods-Limit salt (sodium) to 2000 mg per day.  4) Stay as active as you can everyday 5) Limit all fluids for the day to less than 2 liters 6)   

## 2015-11-04 IMAGING — DX DG CHEST 2V
2 series · 2 of 2 positions shown · non-contrast
Comparison: Prior radiograph from 01/04/2015

CLINICAL DATA: Initial valuation for AICD firing.

EXAM:
CHEST  2 VIEW

[chest pa]
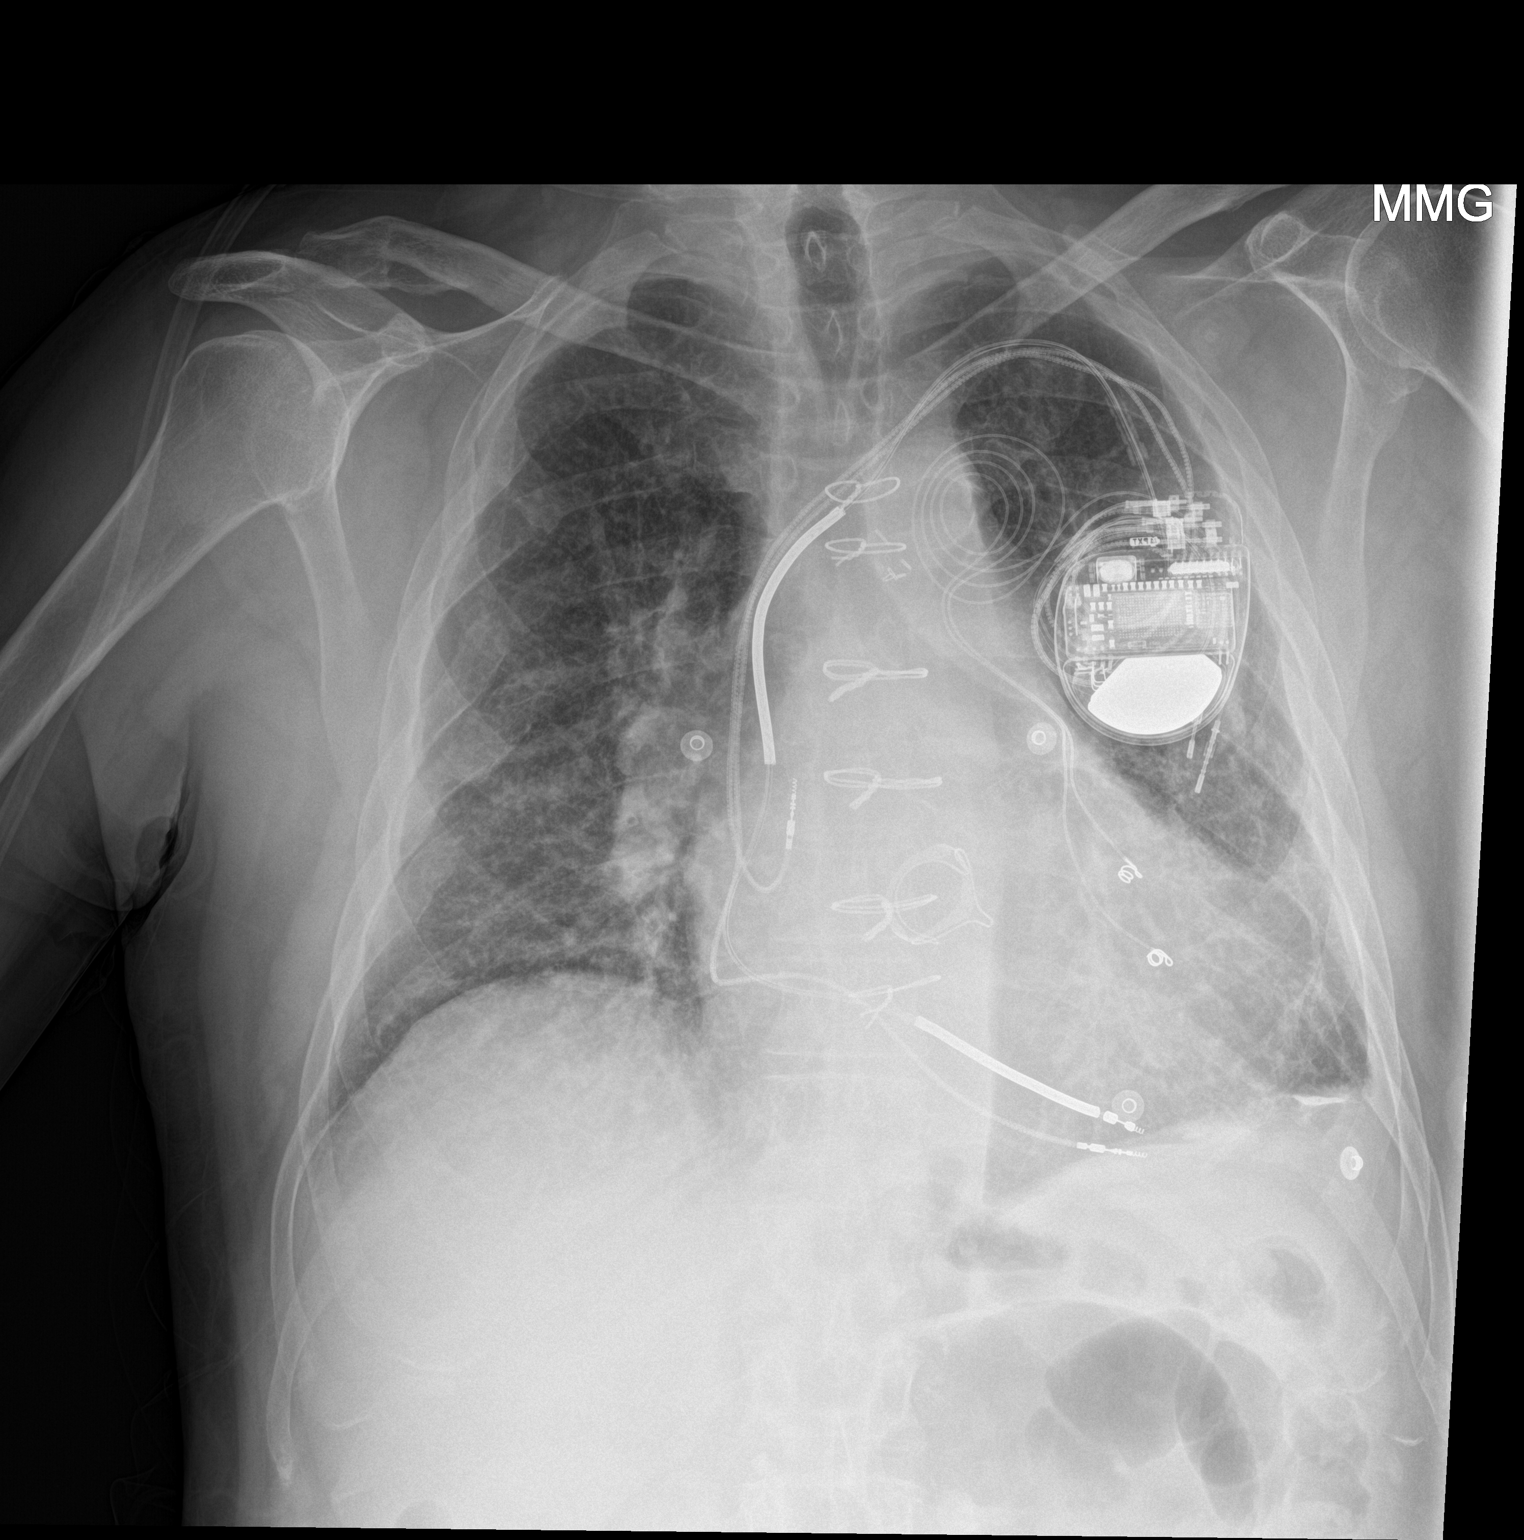

[chest lat]
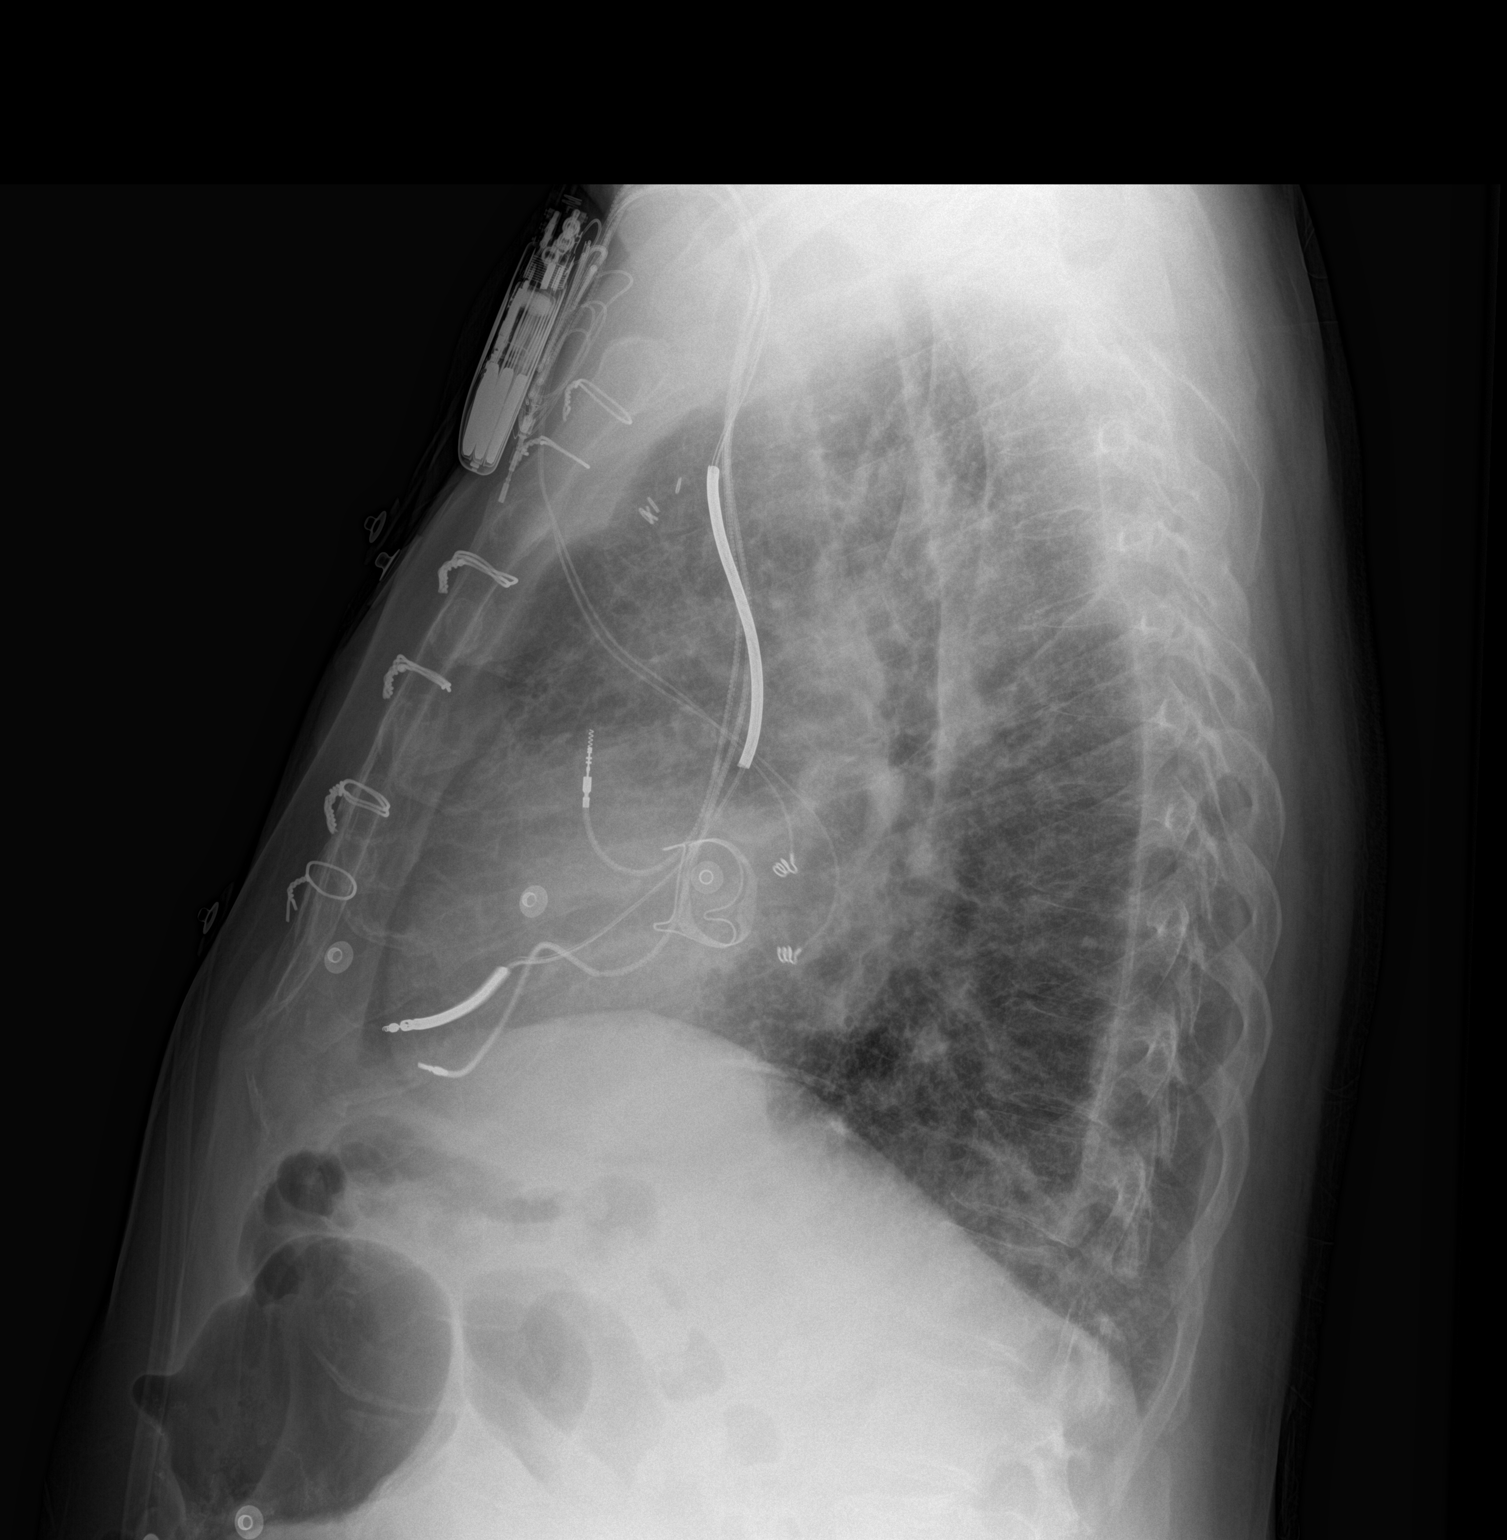

[2 of 2 positions shown; findings below may reference images not displayed]

FINDINGS: Left-sided transvenous pacemaker/AICD is stable in appearance.
Median sternotomy wires with underlying prostatic valve and surgical
clips noted, unchanged. Cardiomegaly is unchanged. Mediastinal
silhouette within normal limits.

Lungs are normally inflated. Blunting of the left costophrenic
angles similar to previous, which may reflect tiny pleural effusion
versus chronic pleural reaction/scarring. No focal infiltrate. No
overt pulmonary edema. Mild diffuse prominence of the interstitial
markings suggestive of mild diffuse interstitial edema.

Osseous structures are unchanged. Multiple remotely healed
right-sided rib fractures noted, stable.
IMPRESSION: 1. Mild cardiomegaly with findings suggestive of mild diffuse
interstitial edema.
2. Minimal blunting of the left costophrenic angle, which may
reflect chronic pleural reaction/scarring versus tiny pleural
effusion.

## 2015-11-05 NOTE — Addendum Note (Signed)
Addended by: Sharee Pimple on: 11/05/2015 04:58 PM   Modules accepted: Orders

## 2015-11-22 ENCOUNTER — Encounter (HOSPITAL_COMMUNITY): Payer: Self-pay | Admitting: *Deleted

## 2015-11-22 ENCOUNTER — Observation Stay (HOSPITAL_COMMUNITY)
Admission: EM | Admit: 2015-11-22 | Discharge: 2015-11-23 | Disposition: A | Discharge status: Home / Self Care | Payer: Medicare Other | Attending: Internal Medicine | Admitting: Internal Medicine

## 2015-11-22 ENCOUNTER — Emergency Department (HOSPITAL_COMMUNITY): Payer: Medicare Other

## 2015-11-22 DIAGNOSIS — Z955 Presence of coronary angioplasty implant and graft: Secondary | ICD-10-CM | POA: Insufficient documentation

## 2015-11-22 DIAGNOSIS — I428 Other cardiomyopathies: Secondary | ICD-10-CM | POA: Insufficient documentation

## 2015-11-22 DIAGNOSIS — F0390 Unspecified dementia without behavioral disturbance: Secondary | ICD-10-CM | POA: Diagnosis present

## 2015-11-22 DIAGNOSIS — I481 Persistent atrial fibrillation: Secondary | ICD-10-CM | POA: Diagnosis not present

## 2015-11-22 DIAGNOSIS — R079 Chest pain, unspecified: Secondary | ICD-10-CM | POA: Diagnosis present

## 2015-11-22 DIAGNOSIS — I209 Angina pectoris, unspecified: Secondary | ICD-10-CM

## 2015-11-22 DIAGNOSIS — Z79899 Other long term (current) drug therapy: Secondary | ICD-10-CM | POA: Insufficient documentation

## 2015-11-22 DIAGNOSIS — I059 Rheumatic mitral valve disease, unspecified: Secondary | ICD-10-CM | POA: Diagnosis present

## 2015-11-22 DIAGNOSIS — I252 Old myocardial infarction: Secondary | ICD-10-CM | POA: Insufficient documentation

## 2015-11-22 DIAGNOSIS — J449 Chronic obstructive pulmonary disease, unspecified: Secondary | ICD-10-CM | POA: Diagnosis not present

## 2015-11-22 DIAGNOSIS — I25119 Atherosclerotic heart disease of native coronary artery with unspecified angina pectoris: Principal | ICD-10-CM | POA: Insufficient documentation

## 2015-11-22 DIAGNOSIS — Z952 Presence of prosthetic heart valve: Secondary | ICD-10-CM | POA: Diagnosis not present

## 2015-11-22 DIAGNOSIS — Z95810 Presence of automatic (implantable) cardiac defibrillator: Secondary | ICD-10-CM | POA: Diagnosis present

## 2015-11-22 DIAGNOSIS — I10 Essential (primary) hypertension: Secondary | ICD-10-CM | POA: Diagnosis present

## 2015-11-22 DIAGNOSIS — R739 Hyperglycemia, unspecified: Secondary | ICD-10-CM | POA: Insufficient documentation

## 2015-11-22 DIAGNOSIS — I130 Hypertensive heart and chronic kidney disease with heart failure and stage 1 through stage 4 chronic kidney disease, or unspecified chronic kidney disease: Secondary | ICD-10-CM | POA: Diagnosis not present

## 2015-11-22 DIAGNOSIS — I255 Ischemic cardiomyopathy: Secondary | ICD-10-CM | POA: Diagnosis present

## 2015-11-22 DIAGNOSIS — I482 Chronic atrial fibrillation, unspecified: Secondary | ICD-10-CM | POA: Diagnosis present

## 2015-11-22 DIAGNOSIS — I2510 Atherosclerotic heart disease of native coronary artery without angina pectoris: Secondary | ICD-10-CM | POA: Diagnosis present

## 2015-11-22 DIAGNOSIS — F17210 Nicotine dependence, cigarettes, uncomplicated: Secondary | ICD-10-CM | POA: Insufficient documentation

## 2015-11-22 DIAGNOSIS — I5022 Chronic systolic (congestive) heart failure: Secondary | ICD-10-CM | POA: Diagnosis not present

## 2015-11-22 DIAGNOSIS — Z951 Presence of aortocoronary bypass graft: Secondary | ICD-10-CM | POA: Insufficient documentation

## 2015-11-22 DIAGNOSIS — N182 Chronic kidney disease, stage 2 (mild): Secondary | ICD-10-CM

## 2015-11-22 DIAGNOSIS — E785 Hyperlipidemia, unspecified: Secondary | ICD-10-CM | POA: Diagnosis not present

## 2015-11-22 DIAGNOSIS — J9610 Chronic respiratory failure, unspecified whether with hypoxia or hypercapnia: Secondary | ICD-10-CM | POA: Insufficient documentation

## 2015-11-22 DIAGNOSIS — Z86718 Personal history of other venous thrombosis and embolism: Secondary | ICD-10-CM | POA: Diagnosis not present

## 2015-11-22 DIAGNOSIS — I442 Atrioventricular block, complete: Secondary | ICD-10-CM | POA: Insufficient documentation

## 2015-11-22 DIAGNOSIS — Z7901 Long term (current) use of anticoagulants: Secondary | ICD-10-CM | POA: Diagnosis not present

## 2015-11-22 DIAGNOSIS — Z8673 Personal history of transient ischemic attack (TIA), and cerebral infarction without residual deficits: Secondary | ICD-10-CM | POA: Insufficient documentation

## 2015-11-22 HISTORY — DX: Chronic respiratory failure, unspecified whether with hypoxia or hypercapnia: J96.10

## 2015-11-22 HISTORY — DX: Presence of automatic (implantable) cardiac defibrillator: Z95.810

## 2015-11-22 HISTORY — DX: Chronic atrial fibrillation, unspecified: I48.20

## 2015-11-22 HISTORY — DX: Unspecified dementia, unspecified severity, without behavioral disturbance, psychotic disturbance, mood disturbance, and anxiety: F03.90

## 2015-11-22 HISTORY — DX: Dependence on supplemental oxygen: Z99.81

## 2015-11-22 LAB — CBC
HEMATOCRIT: 43.9 % (ref 39.0–52.0)
HEMOGLOBIN: 14.2 g/dL (ref 13.0–17.0)
MCH: 29 pg (ref 26.0–34.0)
MCHC: 32.3 g/dL (ref 30.0–36.0)
MCV: 89.8 fL (ref 78.0–100.0)
Platelets: 190 10*3/uL (ref 150–400)
RBC: 4.89 MIL/uL (ref 4.22–5.81)
RDW: 14.2 % (ref 11.5–15.5)
WBC: 10.3 10*3/uL (ref 4.0–10.5)

## 2015-11-22 LAB — BASIC METABOLIC PANEL
ANION GAP: 8 (ref 5–15)
BUN: 15 mg/dL (ref 6–20)
CHLORIDE: 102 mmol/L (ref 101–111)
CO2: 27 mmol/L (ref 22–32)
Calcium: 9.7 mg/dL (ref 8.9–10.3)
Creatinine, Ser: 1.22 mg/dL (ref 0.61–1.24)
GFR calc Af Amer: >60 mL/min (ref >60)
GFR calc non Af Amer: 59 mL/min — ABNORMAL LOW (ref >60)
GLUCOSE: 173 mg/dL — AB (ref 65–99)
POTASSIUM: 4.8 mmol/L (ref 3.5–5.1)
Sodium: 137 mmol/L (ref 135–145)

## 2015-11-22 LAB — I-STAT TROPONIN, ED
TROPONIN I, POC: 0.02 ng/mL (ref 0.00–0.08)
Troponin i, poc: 0.02 ng/mL (ref 0.00–0.08)

## 2015-11-22 LAB — GLUCOSE, CAPILLARY: GLUCOSE-CAPILLARY: 133 mg/dL — AB (ref 65–99)

## 2015-11-22 MED ORDER — DIGOXIN 125 MCG PO TABS
0.1250 mg | ORAL_TABLET | Freq: Every day | ORAL | Status: DC
  Administered 2015-11-23: 0.125 mg via ORAL
  Filled 2015-11-22: qty 1

## 2015-11-22 MED ORDER — NITROGLYCERIN 0.4 MG SL SUBL: 0.4000 mg | SUBLINGUAL_TABLET | SUBLINGUAL | Status: DC | PRN

## 2015-11-22 MED ORDER — LORATADINE 10 MG PO TABS: 10.0000 mg | ORAL_TABLET | Freq: Every day | ORAL | Status: DC | PRN

## 2015-11-22 MED ORDER — SPIRONOLACTONE 25 MG PO TABS
25.0000 mg | ORAL_TABLET | Freq: Every day | ORAL | Status: DC
  Administered 2015-11-23: 25 mg via ORAL
  Filled 2015-11-22: qty 1

## 2015-11-22 MED ORDER — SODIUM CHLORIDE 0.9% FLUSH
3.0000 mL | Freq: Two times a day (BID) | INTRAVENOUS | Status: DC
  Administered 2015-11-22 – 2015-11-23 (×2): 3 mL via INTRAVENOUS

## 2015-11-22 MED ORDER — SODIUM CHLORIDE 0.9% FLUSH: 3.0000 mL | INTRAVENOUS | Status: DC | PRN

## 2015-11-22 MED ORDER — ARFORMOTEROL TARTRATE 15 MCG/2ML IN NEBU: 15.0000 ug | INHALATION_SOLUTION | Freq: Two times a day (BID) | RESPIRATORY_TRACT | Status: DC

## 2015-11-22 MED ORDER — UMECLIDINIUM-VILANTEROL 62.5-25 MCG/INH IN AEPB
1.0000 | INHALATION_SPRAY | Freq: Every day | RESPIRATORY_TRACT | Status: DC
  Filled 2015-11-22: qty 14

## 2015-11-22 MED ORDER — LOSARTAN POTASSIUM 50 MG PO TABS
50.0000 mg | ORAL_TABLET | Freq: Every day | ORAL | Status: DC
  Administered 2015-11-23: 50 mg via ORAL
  Filled 2015-11-22: qty 1

## 2015-11-22 MED ORDER — FUROSEMIDE 40 MG PO TABS
40.0000 mg | ORAL_TABLET | Freq: Two times a day (BID) | ORAL | Status: DC
  Administered 2015-11-23: 40 mg via ORAL
  Filled 2015-11-22: qty 1

## 2015-11-22 MED ORDER — CARVEDILOL 6.25 MG PO TABS
6.2500 mg | ORAL_TABLET | Freq: Two times a day (BID) | ORAL | Status: DC
  Administered 2015-11-22: 6.25 mg via ORAL
  Filled 2015-11-22: qty 1

## 2015-11-22 MED ORDER — ONDANSETRON HCL 4 MG/2ML IJ SOLN: 4.0000 mg | Freq: Four times a day (QID) | INTRAMUSCULAR | Status: DC | PRN

## 2015-11-22 MED ORDER — FENOFIBRATE 54 MG PO TABS
54.0000 mg | ORAL_TABLET | Freq: Every day | ORAL | Status: DC
  Administered 2015-11-23: 54 mg via ORAL
  Filled 2015-11-22: qty 1

## 2015-11-22 MED ORDER — ACETAMINOPHEN 325 MG PO TABS: 650.0000 mg | ORAL_TABLET | ORAL | Status: DC | PRN

## 2015-11-22 MED ORDER — ASPIRIN 81 MG PO CHEW
324.0000 mg | CHEWABLE_TABLET | Freq: Once | ORAL | Status: AC
  Administered 2015-11-22: 324 mg via ORAL
  Filled 2015-11-22: qty 4

## 2015-11-22 MED ORDER — NITROGLYCERIN 2 % TD OINT
1.0000 [in_us] | TOPICAL_OINTMENT | Freq: Four times a day (QID) | TRANSDERMAL | Status: DC
  Administered 2015-11-22 – 2015-11-23 (×3): 1 [in_us] via TOPICAL
  Filled 2015-11-22: qty 1
  Filled 2015-11-22: qty 30

## 2015-11-22 MED ORDER — VITAMIN B-1 100 MG PO TABS
100.0000 mg | ORAL_TABLET | Freq: Every day | ORAL | Status: DC
  Administered 2015-11-23: 100 mg via ORAL
  Filled 2015-11-22: qty 1

## 2015-11-22 MED ORDER — UMECLIDINIUM BROMIDE 62.5 MCG/INH IN AEPB
1.0000 | INHALATION_SPRAY | Freq: Every day | RESPIRATORY_TRACT | Status: DC
  Filled 2015-11-22: qty 7

## 2015-11-22 MED ORDER — SODIUM CHLORIDE 0.9 % IV SOLN: 250.0000 mL | INTRAVENOUS | Status: DC | PRN

## 2015-11-22 MED ORDER — ATORVASTATIN CALCIUM 20 MG PO TABS: 20.0000 mg | ORAL_TABLET | Freq: Every day | ORAL | Status: DC

## 2015-11-22 MED ORDER — ADULT MULTIVITAMIN W/MINERALS CH
1.0000 | ORAL_TABLET | Freq: Every day | ORAL | Status: DC
  Administered 2015-11-23: 1 via ORAL
  Filled 2015-11-22: qty 1

## 2015-11-22 MED ORDER — APIXABAN 5 MG PO TABS
5.0000 mg | ORAL_TABLET | Freq: Two times a day (BID) | ORAL | Status: DC
  Administered 2015-11-22 – 2015-11-23 (×2): 5 mg via ORAL
  Filled 2015-11-22 (×2): qty 1

## 2015-11-22 NOTE — H&P (Signed)
Cardiology History & Physical    Patient ID: [name] MRN: [id number], DOB: 1946-02-29 Date of Encounter: 11/22/2015, 7:35 PM Primary Physician: Cain Saupe, MD Primary Cardiologist: Dr. Shirlee Latch - CHF clinic, EP - [facility] Ridgel  Chief Complaint: chest pain Reason for Admission: chest pain Requesting MD: Dr. Lynelle Doctor  HPI: [name] is a 70 y/o M with history of CAD (remote PCI of Cx; 08/2014 NSTEMI - moderate LM, 80% oCx - poor target for PCI, occ RCA, managed medically), COPD, chronic respiratory failure (h/o hypoxia with home O2), chronic atrial fibrillation with AV nodal ablation and BiV pacing (MDT CRT-D), mixed ischemic/nonischemic cardiomyopathy, chronic systolic CHF, prior chronic ETOH abuse, MV replacement x 2 (Bjork-Shiley in 1982 with redo tissue valve 1/08), CVA (emoblic in setting of AF), tobacco abuse, depression, bipolar disorder, HLD, LE PVD (abnl ABIs, followed by vasc), hyperglycemia (being evaluated by PCP for DM). He presented to [facility] today with chest pain.  History is somewhat difficult given the patient's dementia. He reports he had very focal chest pain on the right side near armpit as well as left side of his chest near his defibrillator since this AM. On/off, lasting hours at a time. The first time I asked him what the longest episode was he said 45 minutes despite previously saying it was lasting hours. Then he changed the answer to 2 hours. It has happened both at rest and with exertion, occurs regardless of activity. He has also been able to exert himself without symptoms. He has chronic SOB. He forgot he has COPD. No nausea, vomiting. He switches off living with his daughter and son. His daughter is concerned that when he lives with his son he does not get the same level of care as when he is with her. The patient requires 24-hour supervision due to his dementia and she says she cannot be sure he's truly getting his medicines when he is there. She says he's become more agitated  today. He is tangential and somewhat pressured during interview. No focal neuro sx. No LEE, orthopnea, or weight gain. The patient raised question of feeling defibrillator fire so the device has been interrogated and we are awaiting the result. He was given 324mg  ASA and a NTG paste. He says he currently feels fine - no further CP. Labs notable for glu 173, Cr 1.22, troponin neg x2, otherwise OK. CXR chronic lung changes without acute findings. VSS, not febrile, tachypneic, tachycardic, or hypoxic. Pt denies further EtOH.  Past Medical History  Diagnosis Date  . DVT (deep venous thrombosis) (HCC)   . Mitral valve disease     a. remote mitral replacement with Bjork Shiley valve;  b. 06/2006 Redo MVR with tissue valve.  Marland Kitchen COPD (chronic obstructive pulmonary disease) (HCC)   . Depression   . Hypertension   . Dyslipidemia   . Ventral hernia   . CAD (coronary artery disease)     a. s/p prior PCI/stenting of the LCX;  b. 08/2014 MV: EF 20%, large septal, apical, and inferior infarct from apex to base, no ischemia;  c. 08/2014 NSTEMI/Cath: LM mod distal dzs extending into ostial LCX (80%), LAD tortuous, RI nl, OM mod dzs, RCA 100 CTO with R->R and L->R collats-->Med Rx.  . Mixed Ischemic and Nonischemic Cardiomyopathy     a. 8/.2015 Echo: Ef 25-30%;  b. 01/2014 s/p MDT [id number] Talbot Grumbling XT CRT-D (ser # [id number]).  . Chronic systolic congestive heart failure (HCC)     a. 01/2014 Echo: EF 25-30%,  mod conc LVH, mod dil LA.  . Bipolar disorder (HCC)   . Anxiety   . Chronic atrial fibrillation (HCC)     a. CHA2DS2VASc = 6->eliquis;  b. S/P AVN RFCA an BiV ICD placement.  . Alcohol abuse   . Complete heart block (HCC)     a. In setting of prior AV nodal ablation r/t afib-->BiV ICD (01/2014).  . Stroke (HCC)   . CVA (cerebral vascular accident) (HCC)     a. Multiple prior embolic strokes.  . Biventricular automatic implantable cardioverter defibrillator in situ     a. 01/2014 s/p MDT [id number] Viva XT CRT-D (ser #  [id number]).  . Hyperglycemia   . Chronic respiratory failure (HCC)   . Dementia      Surgical History:  Past Surgical History  Procedure Laterality Date  . Cystoscopy w/ ureteral stent placement  07/12/2011    Procedure: CYSTOSCOPY WITH RETROGRADE PYELOGRAM/URETERAL STENT PLACEMENT;  Surgeon: Sebastian Ache, MD;  Location: [facility] OR;  Service: Urology;  Laterality: Left;  . Hernia repair    . Mitral valve replacement      remote [facility] valve 1916 with redo tissue valve 06/2006  . Pacemaker placement  2006    Changed to CRT-D in 2009  . Av node ablation  2007  . Icd generator change  02/01/2014    Gen change to: Medtronic VIVA pulse generator, serial number [id number]  . Cardiac catheterization  06/2006    Mild ostial L main stenosis, CFX stent patent, RCA occluded (old)  . Coronary artery bypass graft  2008  . Pacemaker generator change N/A 02/01/2014    Procedure: PACEMAKER GENERATOR CHANGE;  Surgeon: Duke Salvia, MD;  Location: [facility] CATH LAB;  Service: Cardiovascular;  Laterality: N/A;  . Left heart catheterization with coronary angiogram N/A 08/23/2014    Procedure: LEFT HEART CATHETERIZATION WITH CORONARY ANGIOGRAM;  Surgeon: Corky Crafts, MD;  Location: [facility] CATH LAB;  Service: Cardiovascular;  Laterality: N/A;     Home Meds: Prior to Admission medications   Medication Sig Start Date End Date Taking? Authorizing Provider  apixaban (ELIQUIS) 5 MG TABS tablet Take 1 tablet (5 mg total) by mouth 2 (two) times daily. 10/23/15   Graciella Freer, PA-C  atorvastatin (LIPITOR) 20 MG tablet Take 1 tablet (20 mg total) by mouth daily. 10/23/15   Graciella Freer, PA-C  carvedilol (COREG) 6.25 MG tablet Take 1 tablet (6.25 mg total) by mouth 2 (two) times daily with a meal. 10/23/15   Graciella Freer, PA-C  digoxin (LANOXIN) 0.125 MG tablet Take 1 tablet (0.125 mg total) by mouth daily. 10/23/15   Graciella Freer, PA-C  fenofibrate (TRICOR) 48 MG tablet Take 1  tablet (48 mg total) by mouth daily. Reported on 05/28/2015 10/23/15   Graciella Freer, PA-C  furosemide (LASIX) 20 MG tablet Take 2 tablets (40 mg total) by mouth 2 (two) times daily. 10/23/15   Graciella Freer, PA-C  loratadine (CLARITIN) 10 MG tablet Take 10 mg by mouth daily as needed for allergies.    Historical Provider, MD  losartan (COZAAR) 50 MG tablet Take 1 tablet (50 mg total) by mouth daily. 10/23/15   Graciella Freer, PA-C  Multiple Vitamin (MULTIVITAMIN WITH MINERALS) TABS tablet Take 1 tablet by mouth daily. 05/24/15   Osvaldo Shipper, MD  nitroGLYCERIN (NITROSTAT) 0.4 MG SL tablet Place 1 tablet (0.4 mg total) under the tongue every 5 (five) minutes as needed for chest pain. Patient not taking:  Reported on 10/23/2015 01/06/15   [name], PA-C  spironolactone (ALDACTONE) 25 MG tablet Take 1 tablet (25 mg total) by mouth daily. 10/23/15   Graciella Freer, PA-C  thiamine 100 MG tablet Take 1 tablet (100 mg total) by mouth daily. 05/24/15   Osvaldo Shipper, MD  umeclidinium-vilanterol [facility] ELLIPTA) 62.5-25 MCG/INH AEPB Only open the device one time and then take your two separate drags to be sure you get it all 10/11/15   Nyoka Cowden, MD    Allergies:  Allergies  Allergen Reactions  . Warfarin Other (See Comments)    Non compliance and ETOH abuse    Social History   Social History  . Marital Status: Divorced    Spouse Name: N/A  . Number of Children: N/A  . Years of Education: N/A   Occupational History  . Retired Therapist, music    Social History Main Topics  . Smoking status: Current Every Day Smoker -- 1.00 packs/day for 35 years    Types: Cigarettes  . Smokeless tobacco: Never Used  . Alcohol Use: 1.2 oz/week    2 Cans of beer, 0 Standard drinks or equivalent per week     Comment: occasionally  . Drug Use: No  . Sexual Activity: Not Currently   Other Topics Concern  . Not on file   Social History Narrative   ** Merged History  Encounter **         Family History  Problem Relation Age of Onset  . Stroke    . Heart disease    . Alzheimer's disease Mother   . Heart attack Father   . Heart disease Father   . Heart attack Son   . Varicose Veins Son   . Deep vein thrombosis Son   . Heart disease Son   . Heart disease Brother     Review of Systems: no bleeding, fever. All other systems reviewed and are otherwise negative except as noted above.  Labs:   Lab Results  Component Value Date   WBC 10.3 11/22/2015   HGB 14.2 11/22/2015   HCT 43.9 11/22/2015   MCV 89.8 11/22/2015   PLT 190 11/22/2015     Recent Labs Lab 11/22/15 1406  NA 137  K 4.8  CL 102  CO2 27  BUN 15  CREATININE 1.22  CALCIUM 9.7  GLUCOSE 173*    Lab Results  Component Value Date   CHOL 125 08/13/2015   HDL 36* 08/13/2015   LDLCALC 58 08/13/2015   TRIG 155* 08/13/2015     Radiology/Studies:  Dg Chest 2 View  11/22/2015  CLINICAL DATA:  Mid and left chest pain.  Shortness of breath. EXAM: CHEST  2 VIEW COMPARISON:  08/27/2015 FINDINGS: Coarse lung markings appear chronic. No evidence for acute pulmonary edema. Old right rib fractures. Stable appearance of the left cardiac ICD. Stable calcifications along the left hemidiaphragm. Heart size is within normal limits and stable for size. Median sternotomy and evidence for a prosthetic heart valve. IMPRESSION: Chronic lung changes without acute findings. Electronically Signed   By: Richarda Overlie M.D.   On: 11/22/2015 15:05   Wt Readings from Last 3 Encounters:  11/22/15 157 lb 6.5 oz (71.4 kg)  10/23/15 171 lb 12.8 oz (77.928 kg)  10/11/15 168 lb (76.204 kg)    EKG: ventricular paced 80bpm, occ PVCs  Physical Exam: Blood pressure 132/97, pulse 63, temperature 97.3 F (36.3 C), temperature source Oral, resp. rate 16, weight 157 lb 6.5 oz (  71.4 kg), SpO2 96 %. Body mass index is 24.65 kg/(m^2). General: Well developed, well nourished, in no acute distress. Head:  Normocephalic, atraumatic, sclera non-icteric, no xanthomas, nares are without discharge.  Neck: JVD not elevated. Lungs: Clear bilaterally to auscultation without wheezes, rales, or rhonchi. Breathing is unlabored. Heart: RRR with S1 S2. No murmurs, rubs, or gallops appreciated. Abdomen: Soft, non-tender, non-distended with normoactive bowel sounds. No hepatomegaly. No rebound/guarding. No obvious abdominal masses. Msk:  Strength and tone appear normal for age. Extremities: No clubbing or cyanosis. No edema. Distal pedal pulses are 2+ and equal bilaterally. Neuro: Alert and oriented, tangential at times, inconsistent with some answers. No focal deficit. No facial asymmetry. Moves all extremities spontaneously. Psych: Currently normal affect.    Assessment and Plan  [phone] with CAD (remote PCI of Cx; 08/2014 NSTEMI - moderate LM, 80% oCx - poor target for PCI, occ RCA, managed medically), COPD, chronic respiratory failure (h/o hypoxia with home O2), chronic atrial fibrillation with AV nodal ablation and BiV pacing (MDT CRT-D), mixed ischemic/nonischemic cardiomyopathy, chronic systolic CHF, prior chronic ETOH abuse, MV replacement x 2 (Bjork-Shiley in 1982 with redo tissue valve 1/08), CVA (emoblic in setting of AF), tobacco abuse, depression, bipolar disorder, HLD, LE PVD (abnl ABIs, followed by vasc), hyperglycemia here with somewhat atypical chest pain, on/off all day, lasting up to 2 hours. Troponin neg x 2.  1. Chest pain, somewhat atypical albeit in the setting of known significant CAD - troponin neg x2 despite reported on/off duration of pain all day lasting up to 2 hours per episode. EKG paced. Will review further plan with MD.  2. Mixed ICM/NICM/chronic systolic CHF - the patient does not appear volume overloaded. Await device interrogation  3. Chronic atrial fib s/p AVN ablation/BiV - continue anticoagulation for now. This may need to be reconsidered if we cannot ensure that he is taking his  medications safely.  4. HTN - controlled.  5. COPD - not wheezing, was not hypoxic on RA. Tobacco cessation advised.   6. Dementia - daughter is concerned for increasing agitation. He is generally coherent, tangential at times however. Follow during admission.  Signed, Laurann Montana PA-C 11/22/2015, 7:35 PM Pager: [phone]

## 2015-11-22 NOTE — ED Notes (Signed)
Pt reports onset today of right side chest pain that then spread to left side of chest with sob. Pt has cardiac history.

## 2015-11-22 NOTE — ED Notes (Signed)
MD at bedside. 

## 2015-11-22 NOTE — H&P (Deleted)
Error - see consult. Spyros Winch PA-C

## 2015-11-22 NOTE — ED Provider Notes (Signed)
CSN: [id number]     Arrival date & time 11/22/15  1355 History  By signing my name below, I, Tanda Rockers, attest that this documentation has been prepared under the direction and in the presence of Linwood Dibbles, MD. Electronically Signed: Tanda Rockers, ED Scribe. 11/22/2015. 5:18 PM.   Chief Complaint  Patient presents with  . Chest Pain  . Shortness of Breath   Patient is a 70 y.o. male presenting with chest pain and shortness of breath. The history is provided by the patient. No language interpreter was used.  Chest Pain Pain location:  L chest and R chest Pain radiates to:  Does not radiate Pain radiates to the back: no   Pain severity:  Moderate Onset quality:  Sudden Duration: this morning. Timing:  Intermittent Progression:  Unchanged Chronicity:  New Associated symptoms: shortness of breath   Associated symptoms: no fever, no nausea and not vomiting   Risk factors: coronary artery disease, hypertension, male sex and smoking   Shortness of Breath Associated symptoms: chest pain   Associated symptoms: no fever and no vomiting     HPI Comments: [name] is a 70 y.o. male with PMHx COPD, HTN, CAD s/p stenting, and CVA who presents to the Emergency Department complaining of sudden onset, intermittent, diffuse chest pain that began this morning. The pain does not radiate and lasts about 1 hour before subsiding. He also complains of shortness of breath. Pt went to [facility] this morning and attempted to do a walk in appointment. He was told that he was having heart attack type symptoms and told to go to the ED for further evaluation. Pt told [facility] that his AICD shocked him today but while in the ED he states he isn't sure if it went off. Denies fever, nausea, vomiting, or any other associated symptoms.    Past Medical History  Diagnosis Date  . DVT (deep venous thrombosis) (HCC)   . Mitral valve disease     a. remote mitral replacement with Bjork Shiley  valve;  b. 06/2006 Redo MVR with tissue valve.  Marland Kitchen COPD (chronic obstructive pulmonary disease) (HCC)   . Depression   . Hypertension   . Dyslipidemia   . Ventral hernia   . CAD (coronary artery disease)     a. s/p prior PCI/stenting of the LCX;  b. 08/2014 MV: EF 20%, large septal, apical, and inferior infarct from apex to base, no ischemia;  c. 08/2014 NSTEMI/Cath: LM mod distal dzs extending into ostial LCX (80%), LAD tortuous, RI nl, OM mod dzs, RCA 100 CTO with R->R and L->R collats-->Med Rx.  . Mixed Ischemic and Nonischemic Cardiomyopathy     a. 8/.2015 Echo: Ef 25-30%;  b. 01/2014 s/p MDT [id number] Talbot Grumbling XT CRT-D (ser # [id number]).  . Chronic systolic congestive heart failure (HCC)     a. 01/2014 Echo: EF 25-30%, mod conc LVH, mod dil LA.  . Bipolar disorder (HCC)   . Anxiety   . Persistent atrial fibrillation (HCC)     a. CHA2DS2VASc = 6->eliquis;  b. S/P AVN RFCA an BiV ICD placement.  . Alcohol abuse   . Complete heart block (HCC)     a. In setting of prior AV nodal ablation r/t afib-->BiV ICD (01/2014).  . Stroke (HCC)   . CVA (cerebral vascular accident) (HCC)     a. Multiple prior embolic strokes.  . Biventricular automatic implantable cardioverter defibrillator in situ     a. 01/2014 s/p MDT  [id number] Viva XT CRT-D (ser # [id number]).   Past Surgical History  Procedure Laterality Date  . Cystoscopy w/ ureteral stent placement  07/12/2011    Procedure: CYSTOSCOPY WITH RETROGRADE PYELOGRAM/URETERAL STENT PLACEMENT;  Surgeon: Sebastian Ache, MD;  Location: [facility] OR;  Service: Urology;  Laterality: Left;  . Hernia repair    . Mitral valve replacement      remote [facility] valve 1478 with redo tissue valve 06/2006  . Pacemaker placement  2006    Changed to CRT-D in 2009  . Av node ablation  2007  . Icd generator change  02/01/2014    Gen change to: Medtronic VIVA pulse generator, serial number [id number]  . Cardiac catheterization  06/2006    Mild ostial L main stenosis, CFX stent patent,  RCA occluded (old)  . Coronary artery bypass graft  2008  . Pacemaker generator change N/A 02/01/2014    Procedure: PACEMAKER GENERATOR CHANGE;  Surgeon: Duke Salvia, MD;  Location: [facility] CATH LAB;  Service: Cardiovascular;  Laterality: N/A;  . Left heart catheterization with coronary angiogram N/A 08/23/2014    Procedure: LEFT HEART CATHETERIZATION WITH CORONARY ANGIOGRAM;  Surgeon: Corky Crafts, MD;  Location: [facility] CATH LAB;  Service: Cardiovascular;  Laterality: N/A;   Family History  Problem Relation Age of Onset  . Stroke    . Heart disease    . Alzheimer's disease Mother   . Heart attack Father   . Heart disease Father   . Heart attack Son   . Varicose Veins Son   . Deep vein thrombosis Son   . Heart disease Son   . Heart disease Brother    Social History  Substance Use Topics  . Smoking status: Current Every Day Smoker -- 1.00 packs/day for 35 years    Types: Cigarettes  . Smokeless tobacco: Never Used  . Alcohol Use: 1.2 oz/week    2 Cans of beer, 0 Standard drinks or equivalent per week     Comment: occasionally    Review of Systems  Constitutional: Negative for fever.  Respiratory: Positive for shortness of breath.   Cardiovascular: Positive for chest pain.  Gastrointestinal: Negative for nausea and vomiting.  All other systems reviewed and are negative.  Allergies  Warfarin  Home Medications   Prior to Admission medications   Medication Sig Start Date End Date Taking? Authorizing Provider  apixaban (ELIQUIS) 5 MG TABS tablet Take 1 tablet (5 mg total) by mouth 2 (two) times daily. 10/23/15   Graciella Freer, PA-C  atorvastatin (LIPITOR) 20 MG tablet Take 1 tablet (20 mg total) by mouth daily. 10/23/15   Graciella Freer, PA-C  carvedilol (COREG) 6.25 MG tablet Take 1 tablet (6.25 mg total) by mouth 2 (two) times daily with a meal. 10/23/15   Graciella Freer, PA-C  digoxin (LANOXIN) 0.125 MG tablet Take 1 tablet (0.125 mg total) by mouth  daily. 10/23/15   Graciella Freer, PA-C  fenofibrate (TRICOR) 48 MG tablet Take 1 tablet (48 mg total) by mouth daily. Reported on 05/28/2015 10/23/15   Graciella Freer, PA-C  furosemide (LASIX) 20 MG tablet Take 2 tablets (40 mg total) by mouth 2 (two) times daily. 10/23/15   Graciella Freer, PA-C  loratadine (CLARITIN) 10 MG tablet Take 10 mg by mouth daily as needed for allergies.    Historical Provider, MD  losartan (COZAAR) 50 MG tablet Take 1 tablet (50 mg total) by mouth daily. 10/23/15   Graciella Freer, PA-C  Multiple Vitamin (MULTIVITAMIN WITH MINERALS) TABS tablet Take 1 tablet by mouth daily. 05/24/15   Osvaldo Shipper, MD  nitroGLYCERIN (NITROSTAT) 0.4 MG SL tablet Place 1 tablet (0.4 mg total) under the tongue every 5 (five) minutes as needed for chest pain. Patient not taking: Reported on 10/23/2015 01/06/15   Brittainy M Sharol Harness, PA-C  spironolactone (ALDACTONE) 25 MG tablet Take 1 tablet (25 mg total) by mouth daily. 10/23/15   Graciella Freer, PA-C  thiamine 100 MG tablet Take 1 tablet (100 mg total) by mouth daily. 05/24/15   Osvaldo Shipper, MD  umeclidinium-vilanterol [facility] ELLIPTA) 62.5-25 MCG/INH AEPB Only open the device one time and then take your two separate drags to be sure you get it all 10/11/15   Nyoka Cowden, MD   BP 126/74 mmHg  Pulse 71  Temp(Src) 97.3 F (36.3 C) (Oral)  Resp 15  Wt 71.4 kg  SpO2 97%   Physical Exam  Constitutional: He appears well-developed and well-nourished. No distress.  HENT:  Head: Normocephalic and atraumatic.  Right Ear: External ear normal.  Left Ear: External ear normal.  Eyes: Conjunctivae are normal. Right eye exhibits no discharge. Left eye exhibits no discharge. No scleral icterus.  Neck: Neck supple. No tracheal deviation present.  Cardiovascular: Normal rate, regular rhythm and intact distal pulses.   Pulmonary/Chest: Effort normal and breath sounds normal. No stridor. No respiratory distress.  He has no wheezes. He has no rales.  Pacemaker left chest wall. No erythema or swelling.   Abdominal: Soft. Bowel sounds are normal. He exhibits no distension. There is no tenderness. There is no rebound and no guarding.  Musculoskeletal: He exhibits no edema or tenderness.  Neurological: He is alert. He has normal strength. No cranial nerve deficit (no facial droop, extraocular movements intact, no slurred speech) or sensory deficit. He exhibits normal muscle tone. He displays no seizure activity. Coordination normal.  Skin: Skin is warm and dry. No rash noted.  Psychiatric: He has a normal mood and affect.  Nursing note and vitals reviewed.   ED Course  Procedures (including critical care time)  DIAGNOSTIC STUDIES: Oxygen Saturation is 94% on RA, adequate by my interpretation.    COORDINATION OF CARE: 5:15 PM-Discussed treatment plan which includes repeat troponin with pt at bedside and pt agreed to plan.   Labs Review Labs Reviewed  BASIC METABOLIC PANEL - Abnormal; Notable for the following:    Glucose, Bld 173 (*)    GFR calc non Af Amer 59 (*)    All other components within normal limits  CBC  I-STAT TROPOININ, ED  Rosezena Sensor, ED    Imaging Review Dg Chest 2 View  11/22/2015  CLINICAL DATA:  Mid and left chest pain.  Shortness of breath. EXAM: CHEST  2 VIEW COMPARISON:  08/27/2015 FINDINGS: Coarse lung markings appear chronic. No evidence for acute pulmonary edema. Old right rib fractures. Stable appearance of the left cardiac ICD. Stable calcifications along the left hemidiaphragm. Heart size is within normal limits and stable for size. Median sternotomy and evidence for a prosthetic heart valve. IMPRESSION: Chronic lung changes without acute findings. Electronically Signed   By: Richarda Overlie M.D.   On: 11/22/2015 15:05   I have personally reviewed and evaluated these images and lab results as part of my medical decision-making.   EKG Interpretation   Date/Time:   Friday November 22 2015 13:59:26 EDT Ventricular Rate:  80 PR Interval:    QRS Duration: 162 QT Interval:  422  QTC Calculation: 486 R Axis:   -111 Text Interpretation:  Ventricular-paced rhythm with occasional Premature  ventricular complexes Abnormal ECG No significant change since last  tracing Confirmed by Cienna Dumais  MD-J, Sereen Schaff ([id number]) on 11/22/2015 5:15:28 PM      MDM   Final diagnoses:  Chest pain, unspecified chest pain type   Pt has history of known CAD.  He typically does not have chest pain but now presents with sx concerning for the possibility of angina.  Pt is also concerned that his AICD may have gone off.  Will interrogate device.  I will consult with cardiology to discuss admission, serial cardiac enzymes, further evaluation  I personally performed the services described in this documentation, which was scribed in my presence.  The recorded information has been reviewed and is accurate.       Linwood Dibbles, MD 11/22/15 Zollie Pee

## 2015-11-22 NOTE — Consult Note (Deleted)
See H& P.

## 2015-11-22 NOTE — Progress Notes (Signed)
Device interrogation showed normal device function, no lead issues, no shocks. No diagnostic observations noted aside from known afib.  Adylene Dlugosz PA-C

## 2015-11-23 ENCOUNTER — Encounter (HOSPITAL_COMMUNITY): Payer: Self-pay | Admitting: Cardiology

## 2015-11-23 DIAGNOSIS — Z95810 Presence of automatic (implantable) cardiac defibrillator: Secondary | ICD-10-CM | POA: Diagnosis present

## 2015-11-23 DIAGNOSIS — N182 Chronic kidney disease, stage 2 (mild): Secondary | ICD-10-CM

## 2015-11-23 DIAGNOSIS — I209 Angina pectoris, unspecified: Secondary | ICD-10-CM

## 2015-11-23 DIAGNOSIS — I2510 Atherosclerotic heart disease of native coronary artery without angina pectoris: Secondary | ICD-10-CM | POA: Diagnosis present

## 2015-11-23 LAB — LIPID PANEL
CHOL/HDL RATIO: 4 ratio
CHOLESTEROL: 127 mg/dL (ref 0–200)
HDL: 32 mg/dL — ABNORMAL LOW (ref >40)
LDL CALC: 55 mg/dL (ref 0–99)
TRIGLYCERIDES: 199 mg/dL — AB (ref <150)
VLDL: 40 mg/dL (ref 0–40)

## 2015-11-23 LAB — CBC
HCT: 41 % (ref 39.0–52.0)
Hemoglobin: 12.9 g/dL — ABNORMAL LOW (ref 13.0–17.0)
MCH: 28.5 pg (ref 26.0–34.0)
MCHC: 31.5 g/dL (ref 30.0–36.0)
MCV: 90.5 fL (ref 78.0–100.0)
PLATELETS: 187 10*3/uL (ref 150–400)
RBC: 4.53 MIL/uL (ref 4.22–5.81)
RDW: 14.3 % (ref 11.5–15.5)
WBC: 9.8 10*3/uL (ref 4.0–10.5)

## 2015-11-23 LAB — BASIC METABOLIC PANEL
Anion gap: 9 (ref 5–15)
BUN: 20 mg/dL (ref 6–20)
CALCIUM: 9.4 mg/dL (ref 8.9–10.3)
CO2: 27 mmol/L (ref 22–32)
CREATININE: 1.34 mg/dL — AB (ref 0.61–1.24)
Chloride: 103 mmol/L (ref 101–111)
GFR, EST NON AFRICAN AMERICAN: 53 mL/min — AB (ref >60)
GLUCOSE: 260 mg/dL — AB (ref 65–99)
Potassium: 4.6 mmol/L (ref 3.5–5.1)
Sodium: 139 mmol/L (ref 135–145)

## 2015-11-23 LAB — TROPONIN I
Troponin I: 0.05 ng/mL — ABNORMAL HIGH (ref <0.031)
Troponin I: 0.05 ng/mL — ABNORMAL HIGH (ref <0.031)
Troponin I: 0.07 ng/mL — ABNORMAL HIGH (ref <0.031)

## 2015-11-23 MED ORDER — ISOSORBIDE MONONITRATE ER 30 MG PO TB24
30.0000 mg | ORAL_TABLET | Freq: Every day | ORAL | Status: DC
  Administered 2015-11-23: 30 mg via ORAL
  Filled 2015-11-23: qty 1

## 2015-11-23 MED ORDER — FOLIC ACID 1 MG PO TABS
1.0000 mg | ORAL_TABLET | Freq: Every day | ORAL | Status: DC
  Administered 2015-11-23: 1 mg via ORAL
  Filled 2015-11-23: qty 1

## 2015-11-23 MED ORDER — LORAZEPAM 1 MG PO TABS: 1.0000 mg | ORAL_TABLET | Freq: Four times a day (QID) | ORAL | Status: DC | PRN

## 2015-11-23 MED ORDER — ISOSORBIDE MONONITRATE ER 30 MG PO TB24: 30.0000 mg | ORAL_TABLET | Freq: Every day | ORAL | Status: DC

## 2015-11-23 MED ORDER — LOSARTAN POTASSIUM 50 MG PO TABS: 50.0000 mg | ORAL_TABLET | Freq: Every day | ORAL | Status: DC

## 2015-11-23 MED ORDER — CARVEDILOL 6.25 MG PO TABS
6.2500 mg | ORAL_TABLET | Freq: Two times a day (BID) | ORAL | Status: DC
  Administered 2015-11-23: 6.25 mg via ORAL
  Filled 2015-11-23: qty 1

## 2015-11-23 MED ORDER — LORAZEPAM 2 MG/ML IJ SOLN: 1.0000 mg | Freq: Four times a day (QID) | INTRAMUSCULAR | Status: DC | PRN

## 2015-11-23 NOTE — Progress Notes (Signed)
Pt has refused breathing tx x 2 (last pm and today).

## 2015-11-23 NOTE — Discharge Summary (Signed)
Discharge Summary    Patient ID: [name],  MRN: [id number], DOB/AGE: July 03, 1945 70 y.o.  Admit date: 11/22/2015 Discharge date: 11/23/2015  Primary Care Provider: FULP, CAMMIE Primary Cardiologist: Dr. Alba Destine  Discharge Diagnoses    Principal Problem:   Angina pectoris [facility]) Active Problems:   Atrial fibrillation, chronic (HCC)   Severe chronic obstructive pulmonary disease (HCC)   Automatic implantable cardioverter-defibrillator in situ   Hypertension   Chest pain   Mitral valve disease   Mixed Ischemic and Nonischemic Cardiomyopathy   Chronic systolic CHF (congestive heart failure) (HCC)   Dementia   CAD (coronary artery disease)   Chronic kidney disease (CKD), stage II (mild)   Diagnostic Studies/Procedures    N/A _____________   History of Present Illness & Hospital Course    [name] is a 70 y/o M with history of CAD (remote PCI of Cx; 08/2014 NSTEMI - moderate LM, 80% oCx - poor target for PCI, occ RCA, managed medically), COPD, chronic respiratory failure (h/o hypoxia with home O2), chronic atrial fibrillation with AV nodal ablation and BiV pacing (MDT CRT-D), mixed ischemic/nonischemic cardiomyopathy, chronic systolic CHF, prior chronic ETOH abuse, MV replacement x 2 (Bjork-Shiley in 1982 with redo tissue valve 1/08), CVA (emoblic in setting of AF), tobacco abuse, depression, bipolar disorder, HLD, LE PVD (abnl ABIs, followed by vasc), hyperglycemia (being evaluated by PCP for DM), slight dementia. He presented to [facility] yesterday with chest pain. He reported having very focal chest pain on the right side near armpit as well as left side of his chest near his defibrillator for the last day, on/off lasting hours at a time, occurring regardless of activity or inactivity. He has also been able to exert himself at times without any pain at all.  He denied any nausea or vomiting. His daughter felt he was more agitated than usual - apparently answering the  registration questions and signing the forms in the ER caused him to become excitable. After the family left, by the time Dr. Donnie Aho saw the patient, he was calmer and able to provide a good history. The patient raised question of feeling defibrillator fire so the device was interrogated without any significant events or shocks (only known underlying AF). Labs notable for glu 173, Cr 1.22, initial troponin negative. CXR chronic lung changes without acute findings. VSS, not febrile, tachypneic, tachycardic, or hypoxic. He denied further alcohol use. He lives sometimes with his son and sometimes with his daughter.   He was treated in the ER with NTG paste with resolution of his symptoms. He was admitted overnight with for further evaluation. Troponins 0.02-0.02-0.07-0.05. Dr. Donnie Aho did not feel these were significant to warrant further evaluation. In the setting of the patient's known significant CAD, Dr. Donnie Aho recommended continued medical therapy and added Imdur. The patient requested to go home today. He ambulated without any difficulty. He is not on ASA due to concomitant Eliquis. Dr. Donnie Aho has seen and examined the patient today and feels he is stable for discharge. I have sent a message to our [facility] office's scheduler as well as CHF clinic requesting a follow-up appointment with Dr. Shirlee Latch, and our office will call the patient with this information. Of note his Cr was 1.34 today, which appears c/w prior values that vary, suspect CKD II-III.   Consultants: N/A  _____________  Discharge Vitals Blood pressure 108/65, pulse 60, temperature 97.6 F (36.4 C), temperature source Oral, resp. rate 19, height 5\' 7"  (1.702 m), weight 164 lb 9.6  oz (74.662 kg), SpO2 96 %.  Filed Weights   11/22/15 1655 11/22/15 2214  Weight: 157 lb 6.5 oz (71.4 kg) 164 lb 9.6 oz (74.662 kg)    Labs & Radiologic Studies    CBC  Recent Labs  11/22/15 1406 11/23/15 0346  WBC 10.3 9.8  HGB 14.2 12.9*  HCT 43.9  41.0  MCV 89.8 90.5  PLT 190 187   Basic Metabolic Panel  Recent Labs  11/22/15 1406 11/23/15 0346  NA 137 139  K 4.8 4.6  CL 102 103  CO2 27 27  GLUCOSE 173* 260*  BUN 15 20  CREATININE 1.22 1.34*  CALCIUM 9.7 9.4   Cardiac Enzymes  Recent Labs  11/23/15 0001 11/23/15 0346  TROPONINI 0.07* 0.05*   Fasting Lipid Panel  Recent Labs  11/23/15 0346  CHOL 127  HDL 32*  LDLCALC 55  TRIG 409*  CHOLHDL 4.0   _____________  Dg Chest 2 View  11/22/2015  CLINICAL DATA:  Mid and left chest pain.  Shortness of breath. EXAM: CHEST  2 VIEW COMPARISON:  08/27/2015 FINDINGS: Coarse lung markings appear chronic. No evidence for acute pulmonary edema. Old right rib fractures. Stable appearance of the left cardiac ICD. Stable calcifications along the left hemidiaphragm. Heart size is within normal limits and stable for size. Median sternotomy and evidence for a prosthetic heart valve. IMPRESSION: Chronic lung changes without acute findings. Electronically Signed   By: Richarda Overlie M.D.   On: 11/22/2015 15:05   Disposition   Pt is being discharged home today in good condition.  Follow-up Plans & Appointments    Follow-up Information    Follow up with Marca Ancona, MD.   Specialty:  Cardiology   Why:  Office will call you for your followup appointment. Call office if you have not heard back in 3 days.   Contact information:   [address] [place] [address] [phone]      Discharge Instructions    Diet - low sodium heart healthy    Complete by:  As directed      Increase activity slowly    Complete by:  As directed   You have been started on a long-acting nitroglycerin heart medicine called isosorbide (Imdur).           Discharge Medications   Current Discharge Medication List    START taking these medications   Details  isosorbide mononitrate (IMDUR) 30 MG 24 hr tablet Take 1 tablet (30 mg total) by mouth daily. Qty: 30 tablet, Refills: 6       CONTINUE these medications which have NOT CHANGED   Details  apixaban (ELIQUIS) 5 MG TABS tablet Take 1 tablet (5 mg total) by mouth 2 (two) times daily.     atorvastatin (LIPITOR) 20 MG tablet Take 1 tablet (20 mg total) by mouth daily.     carvedilol (COREG) 6.25 MG tablet Take 1 tablet (6.25 mg total) by mouth 2 (two) times daily with a meal.     digoxin (LANOXIN) 0.125 MG tablet Take 1 tablet (0.125 mg total) by mouth daily.     fenofibrate (TRICOR) 48 MG tablet Take 1 tablet (48 mg total) by mouth daily. Reported on 05/28/2015     furosemide (LASIX) 20 MG tablet Take 2 tablets (40 mg total) by mouth 2 (two) times daily.     losartan (COZAAR) 50 MG tablet Take 1 tablet (50 mg total) by mouth daily.     Multiple Vitamin (  MULTIVITAMIN WITH MINERALS) TABS tablet Take 1 tablet by mouth daily.     nitroGLYCERIN (NITROSTAT) 0.4 MG SL tablet Place 1 tablet (0.4 mg total) under the tongue every 5 (five) minutes as needed for chest pain.     spironolactone (ALDACTONE) 25 MG tablet Take 1 tablet (25 mg total) by mouth daily.     thiamine 100 MG tablet Take 1 tablet (100 mg total) by mouth daily.       STOP taking these medications     loratadine (CLARITIN) 10 MG tablet - patient has not taken in last 30 days          Allergies:  Allergies  Allergen Reactions  . Warfarin Other (See Comments)    Non compliance and ETOH abuse    Outstanding Labs/Studies   N/A  Duration of Discharge Encounter   Greater than 30 minutes including physician time.  Signed, Tacey Ruiz Niyana Chesbro PA-C 11/23/2015, 10:10 AM

## 2015-11-23 NOTE — Progress Notes (Signed)
Subjective:  No more chest pain overnight.  Says he wants to go home this morning  Objective:  Vital Signs in the last 24 hours: BP 108/65 mmHg  Pulse 60  Temp(Src) 97.6 F (36.4 C) (Oral)  Resp 19  Ht 5\' 7"  (1.702 m)  Wt 74.662 kg (164 lb 9.6 oz)  BMI 25.77 kg/m2  SpO2 96%  Physical Exam: Older appearing white male in no acute distress Lungs:  Clear Cardiac:  Regular rhythm, normal S1 and S2, no S3, 1 to 2/6 systolic murmur  Abdomen:  Soft, nontender, no masses Extremities: Trace edema present  Intake/Output from previous day: 06/16 0701 - 06/17 0700 In: 240 [P.O.:240] Out: 0   Weight Filed Weights   11/22/15 1655 11/22/15 2214  Weight: 71.4 kg (157 lb 6.5 oz) 74.662 kg (164 lb 9.6 oz)    Lab Results: Basic Metabolic Panel:  Recent Labs  63/84/53 1406 11/23/15 0346  NA 137 139  K 4.8 4.6  CL 102 103  CO2 27 27  GLUCOSE 173* 260*  BUN 15 20  CREATININE 1.22 1.34*   CBC:  Recent Labs  11/22/15 1406 11/23/15 0346  WBC 10.3 9.8  HGB 14.2 12.9*  HCT 43.9 41.0  MCV 89.8 90.5  PLT 190 187   Cardiac Enzymes: Troponin (Point of Care Test)  Recent Labs  11/22/15 1732  TROPIPOC 0.02   Cardiac Panel (last 3 results)  Recent Labs  11/23/15 0001 11/23/15 0346  TROPONINI 0.07* 0.05*    Telemetry: Paced rhythm regular   Assessment/Plan:  1.  Chest pain with atypical features that resolved-1 troponin minimally elevated probably not significant 2.  Coronary artery disease 3.  Chronic systolic heart failure 4.  Chronic atrial fibrillation 5.  Dementia  Recommendations:  At this point he is no longer having chest pain.  Will ambulate in the hall and if stable discharged home with follow-up next week.  Add Imdur to regimen.     Darden Palmer  MD [facility] Cardiology  11/23/2015, 9:37 AM

## 2015-12-06 ENCOUNTER — Inpatient Hospital Stay (HOSPITAL_COMMUNITY): Admission: RE | Admit: 2015-12-06 | Payer: Medicare Other | Source: Ambulatory Visit

## 2015-12-31 ENCOUNTER — Ambulatory Visit: Payer: Medicare Other | Admitting: Pulmonary Disease

## 2016-01-08 ENCOUNTER — Inpatient Hospital Stay (HOSPITAL_COMMUNITY): Admission: RE | Admit: 2016-01-08 | Payer: Medicare Other | Source: Ambulatory Visit

## 2016-01-09 IMAGING — CR DG LUMBAR SPINE COMPLETE 4+V
5 series · 5 of 5 positions shown · non-contrast
Comparison: CT scan of November 08, 2012.

CLINICAL DATA: Chronic low back pain for months without known
injury.

EXAM:
LUMBAR SPINE - COMPLETE 4+ VIEW

[l-spine ap]
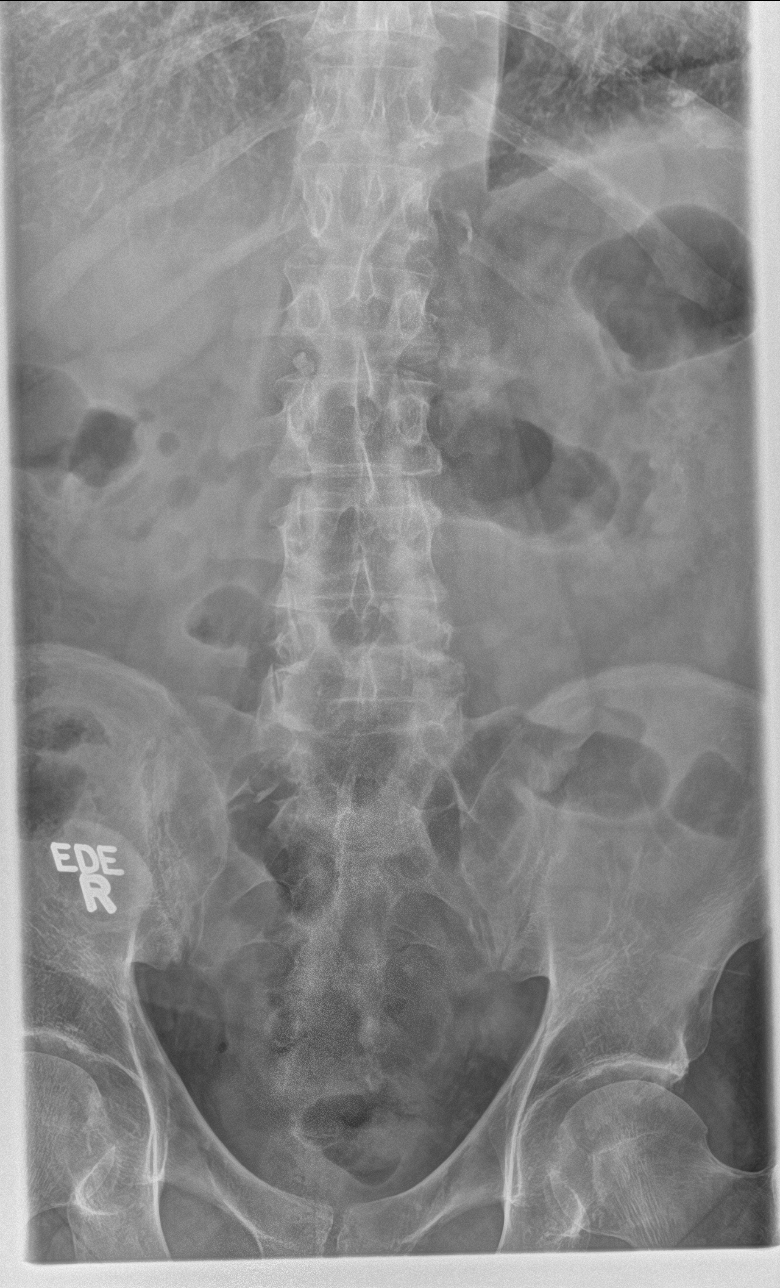

[l-spine obl (1 of 2)]
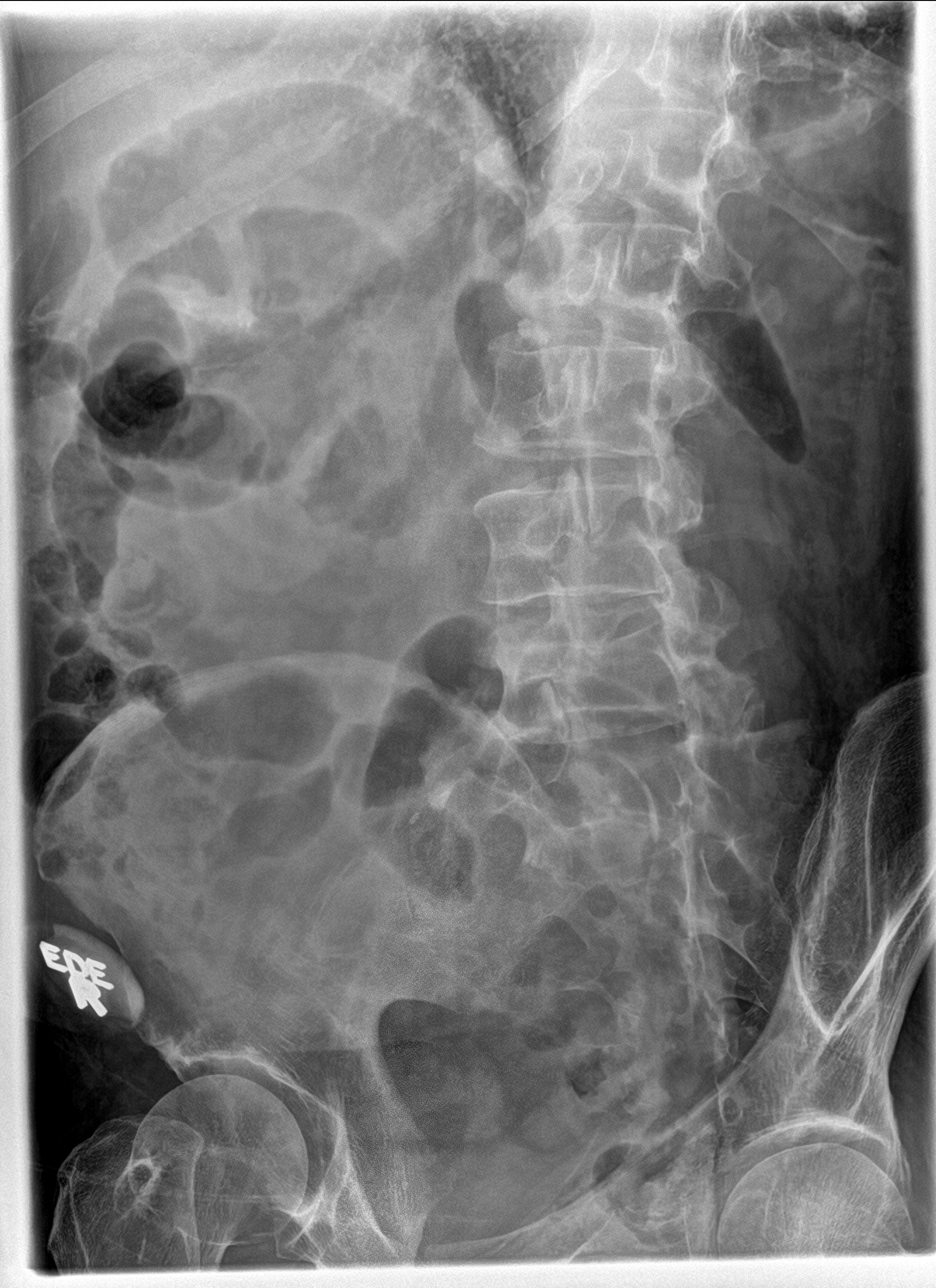

[l-spine obl (2 of 2)]
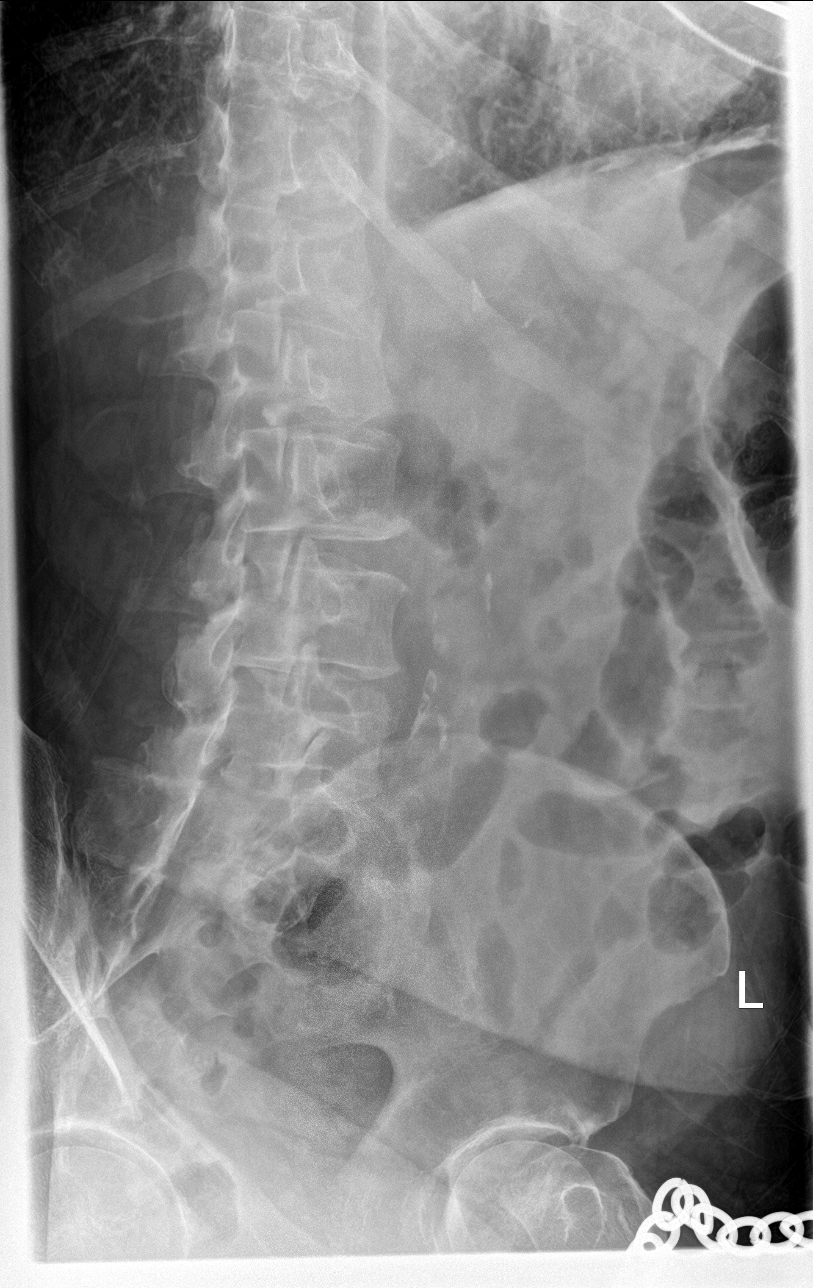

[l-spine lat]
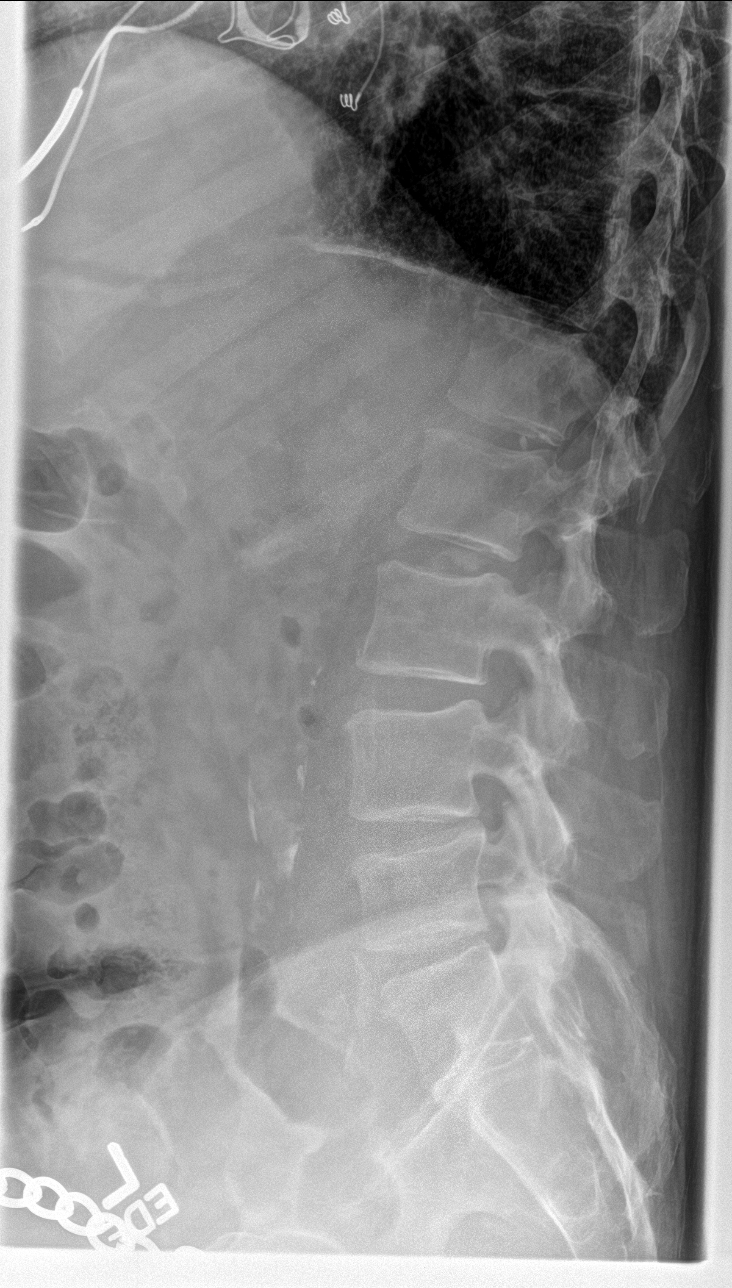

[l-spine spot]
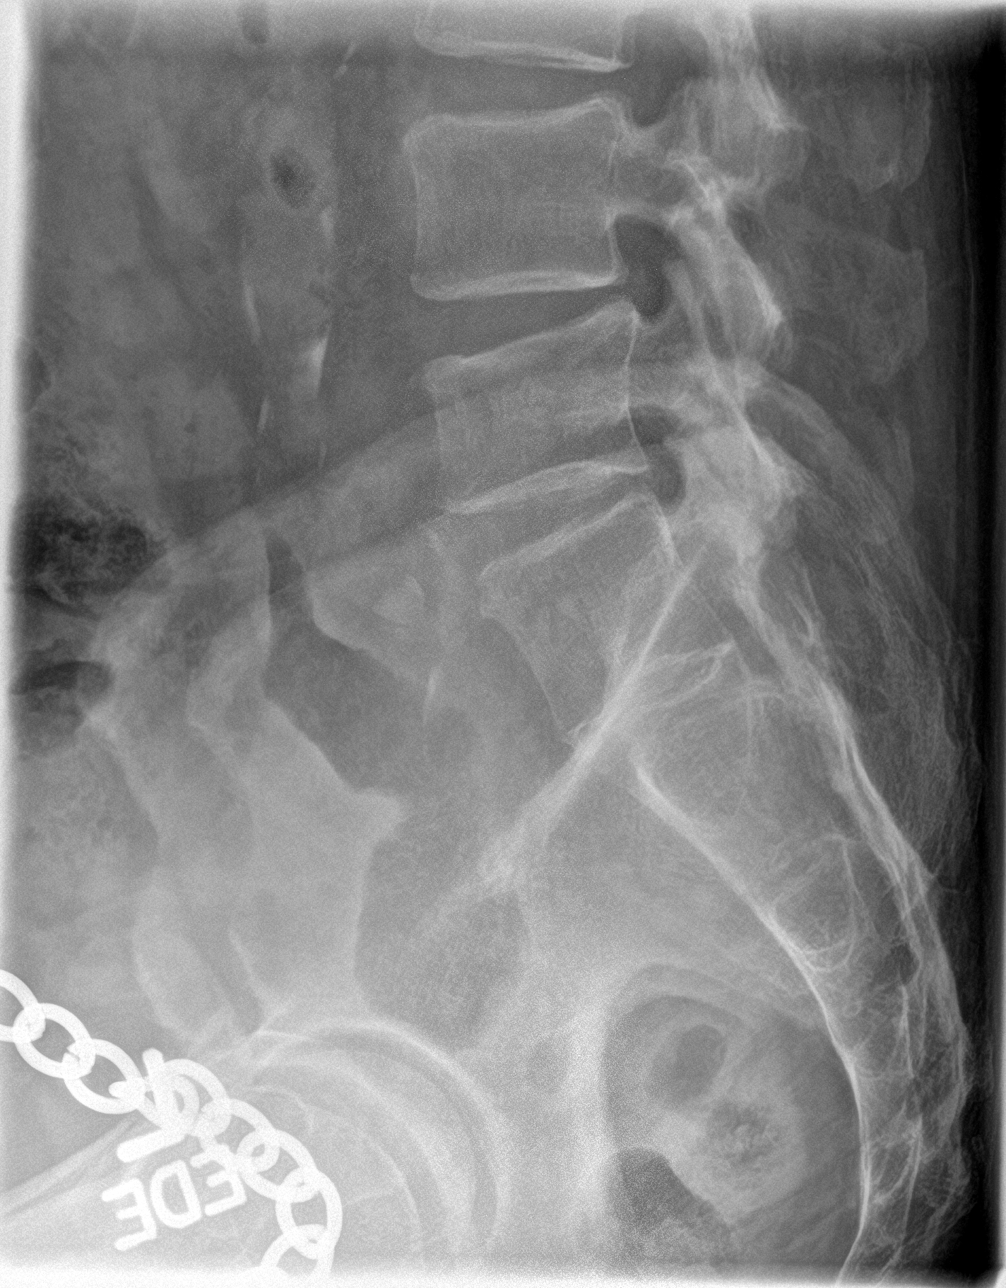

[5 of 5 positions shown; findings below may reference images not displayed]

FINDINGS: Mild wedge compression deformity of L5 is noted which is unchanged
compared to prior exam consistent with old fracture. No acute
fracture or spondylolisthesis is noted. Mild degenerative changes
seen involving posterior facet joints of L5-S1. Atherosclerosis of
abdominal aorta is noted. Disc spaces appear well maintained.
IMPRESSION: No acute abnormality seen in the lumbar spine.

## 2016-01-09 IMAGING — DX DG CHEST 2V
2 series · 2 of 2 positions shown · non-contrast
Comparison: Chest radiograph January 27, 2015

CLINICAL DATA: Sore throat and shortness of breath today.

EXAM:
CHEST  2 VIEW

[chest pa]
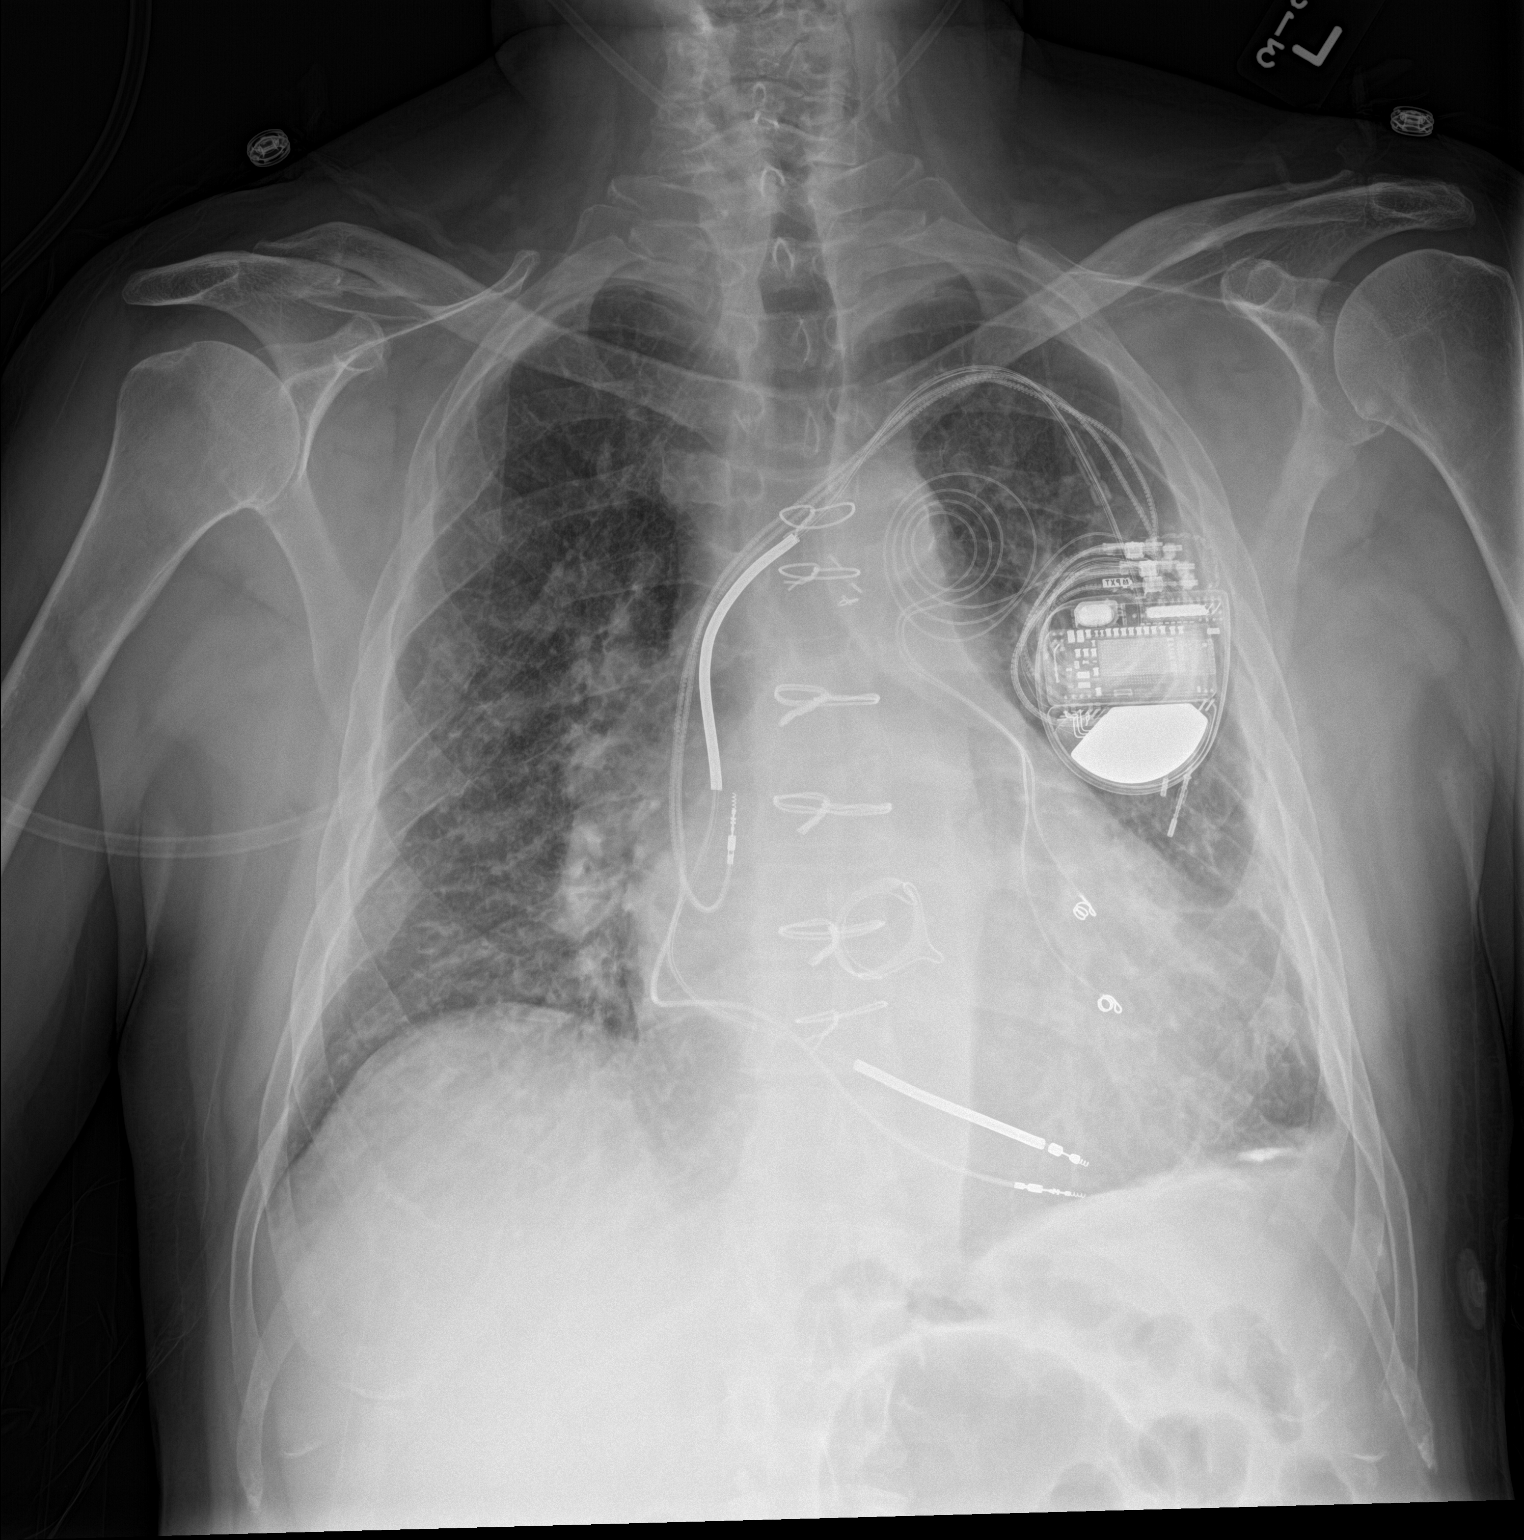

[chest lat]
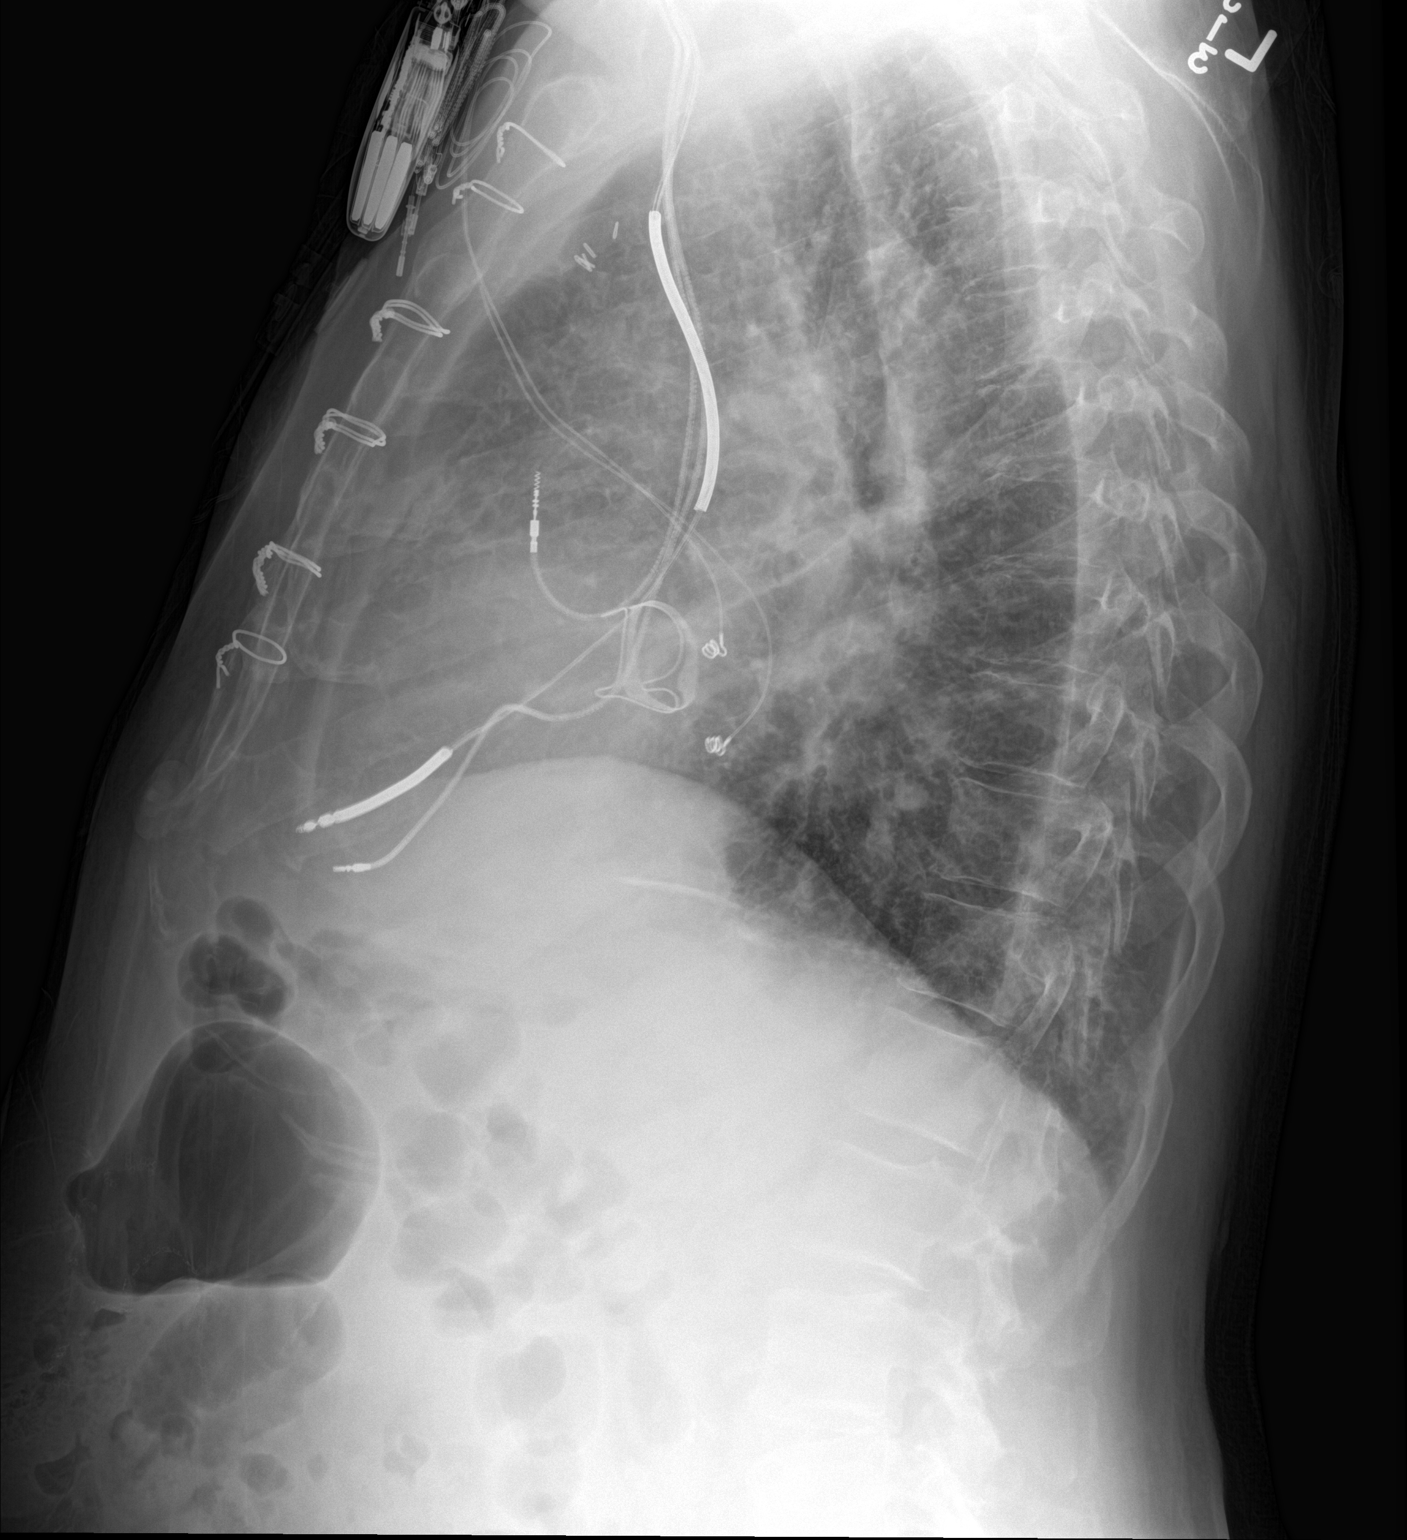

[2 of 2 positions shown; findings below may reference images not displayed]

FINDINGS: The cardiac silhouette is moderately enlarged unchanged. Status post
median sternotomy, cardiac valve replacement. 3 lead cardiac
defibrillator in situ. Multiple pacer wires in LEFT chest. Similar
chronic bronchitic changes, no pleural effusion. LEFT lung base
calcified pleural plaque with pleural thickening. Apical pleural
thickening. No pneumothorax. Old RIGHT rib fractures. Soft tissue
planes and included osseous structure nonsuspicious.
IMPRESSION: Stable cardiomegaly and chronic bronchitic changes.

## 2016-03-02 ENCOUNTER — Inpatient Hospital Stay (HOSPITAL_COMMUNITY)
Admission: EM | Admit: 2016-03-02 | Discharge: 2016-03-10 | DRG: 291 | Disposition: A | Discharge status: Home Health | Payer: Medicare Other | Attending: Internal Medicine | Admitting: Internal Medicine

## 2016-03-02 ENCOUNTER — Emergency Department (HOSPITAL_COMMUNITY): Payer: Medicare Other

## 2016-03-02 ENCOUNTER — Encounter (HOSPITAL_COMMUNITY): Payer: Self-pay | Admitting: *Deleted

## 2016-03-02 DIAGNOSIS — R0602 Shortness of breath: Secondary | ICD-10-CM | POA: Diagnosis present

## 2016-03-02 DIAGNOSIS — Z7901 Long term (current) use of anticoagulants: Secondary | ICD-10-CM | POA: Diagnosis not present

## 2016-03-02 DIAGNOSIS — D72829 Elevated white blood cell count, unspecified: Secondary | ICD-10-CM

## 2016-03-02 DIAGNOSIS — J441 Chronic obstructive pulmonary disease with (acute) exacerbation: Secondary | ICD-10-CM

## 2016-03-02 DIAGNOSIS — Z820 Family history of epilepsy and other diseases of the nervous system: Secondary | ICD-10-CM

## 2016-03-02 DIAGNOSIS — Z95810 Presence of automatic (implantable) cardiac defibrillator: Secondary | ICD-10-CM | POA: Diagnosis not present

## 2016-03-02 DIAGNOSIS — Z952 Presence of prosthetic heart valve: Secondary | ICD-10-CM

## 2016-03-02 DIAGNOSIS — I130 Hypertensive heart and chronic kidney disease with heart failure and stage 1 through stage 4 chronic kidney disease, or unspecified chronic kidney disease: Secondary | ICD-10-CM | POA: Diagnosis present

## 2016-03-02 DIAGNOSIS — Z8673 Personal history of transient ischemic attack (TIA), and cerebral infarction without residual deficits: Secondary | ICD-10-CM | POA: Diagnosis not present

## 2016-03-02 DIAGNOSIS — Z951 Presence of aortocoronary bypass graft: Secondary | ICD-10-CM

## 2016-03-02 DIAGNOSIS — N183 Chronic kidney disease, stage 3 unspecified: Secondary | ICD-10-CM

## 2016-03-02 DIAGNOSIS — I248 Other forms of acute ischemic heart disease: Secondary | ICD-10-CM | POA: Diagnosis present

## 2016-03-02 DIAGNOSIS — F17210 Nicotine dependence, cigarettes, uncomplicated: Secondary | ICD-10-CM | POA: Diagnosis present

## 2016-03-02 DIAGNOSIS — F1010 Alcohol abuse, uncomplicated: Secondary | ICD-10-CM | POA: Diagnosis not present

## 2016-03-02 DIAGNOSIS — J9601 Acute respiratory failure with hypoxia: Secondary | ICD-10-CM | POA: Diagnosis present

## 2016-03-02 DIAGNOSIS — F319 Bipolar disorder, unspecified: Secondary | ICD-10-CM | POA: Diagnosis present

## 2016-03-02 DIAGNOSIS — J9621 Acute and chronic respiratory failure with hypoxia: Secondary | ICD-10-CM | POA: Diagnosis present

## 2016-03-02 DIAGNOSIS — J9611 Chronic respiratory failure with hypoxia: Secondary | ICD-10-CM | POA: Diagnosis present

## 2016-03-02 DIAGNOSIS — I2510 Atherosclerotic heart disease of native coronary artery without angina pectoris: Secondary | ICD-10-CM | POA: Diagnosis present

## 2016-03-02 DIAGNOSIS — E1165 Type 2 diabetes mellitus with hyperglycemia: Secondary | ICD-10-CM | POA: Diagnosis present

## 2016-03-02 DIAGNOSIS — I5023 Acute on chronic systolic (congestive) heart failure: Secondary | ICD-10-CM | POA: Diagnosis present

## 2016-03-02 DIAGNOSIS — I509 Heart failure, unspecified: Secondary | ICD-10-CM

## 2016-03-02 DIAGNOSIS — E1122 Type 2 diabetes mellitus with diabetic chronic kidney disease: Secondary | ICD-10-CM | POA: Diagnosis present

## 2016-03-02 DIAGNOSIS — F10188 Alcohol abuse with other alcohol-induced disorder: Secondary | ICD-10-CM | POA: Diagnosis present

## 2016-03-02 DIAGNOSIS — R112 Nausea with vomiting, unspecified: Secondary | ICD-10-CM

## 2016-03-02 DIAGNOSIS — I739 Peripheral vascular disease, unspecified: Secondary | ICD-10-CM | POA: Diagnosis present

## 2016-03-02 DIAGNOSIS — Z9111 Patient's noncompliance with dietary regimen: Secondary | ICD-10-CM

## 2016-03-02 DIAGNOSIS — E785 Hyperlipidemia, unspecified: Secondary | ICD-10-CM | POA: Diagnosis present

## 2016-03-02 DIAGNOSIS — Z23 Encounter for immunization: Secondary | ICD-10-CM | POA: Diagnosis not present

## 2016-03-02 DIAGNOSIS — J9602 Acute respiratory failure with hypercapnia: Secondary | ICD-10-CM

## 2016-03-02 DIAGNOSIS — E872 Acidosis: Secondary | ICD-10-CM | POA: Diagnosis present

## 2016-03-02 DIAGNOSIS — F0390 Unspecified dementia without behavioral disturbance: Secondary | ICD-10-CM | POA: Diagnosis present

## 2016-03-02 DIAGNOSIS — Z8249 Family history of ischemic heart disease and other diseases of the circulatory system: Secondary | ICD-10-CM

## 2016-03-02 DIAGNOSIS — I5043 Acute on chronic combined systolic (congestive) and diastolic (congestive) heart failure: Secondary | ICD-10-CM | POA: Diagnosis not present

## 2016-03-02 DIAGNOSIS — Z79899 Other long term (current) drug therapy: Secondary | ICD-10-CM

## 2016-03-02 DIAGNOSIS — N179 Acute kidney failure, unspecified: Secondary | ICD-10-CM

## 2016-03-02 DIAGNOSIS — I426 Alcoholic cardiomyopathy: Secondary | ICD-10-CM | POA: Diagnosis present

## 2016-03-02 DIAGNOSIS — Z9861 Coronary angioplasty status: Secondary | ICD-10-CM | POA: Diagnosis not present

## 2016-03-02 DIAGNOSIS — R6889 Other general symptoms and signs: Secondary | ICD-10-CM

## 2016-03-02 DIAGNOSIS — I482 Chronic atrial fibrillation, unspecified: Secondary | ICD-10-CM | POA: Diagnosis present

## 2016-03-02 DIAGNOSIS — J449 Chronic obstructive pulmonary disease, unspecified: Secondary | ICD-10-CM

## 2016-03-02 DIAGNOSIS — R451 Restlessness and agitation: Secondary | ICD-10-CM

## 2016-03-02 DIAGNOSIS — Z9981 Dependence on supplemental oxygen: Secondary | ICD-10-CM

## 2016-03-02 DIAGNOSIS — Z9114 Patient's other noncompliance with medication regimen: Secondary | ICD-10-CM

## 2016-03-02 DIAGNOSIS — J9622 Acute and chronic respiratory failure with hypercapnia: Secondary | ICD-10-CM | POA: Diagnosis present

## 2016-03-02 LAB — I-STAT ARTERIAL BLOOD GAS, ED
BICARBONATE: 29.2 mmol/L — AB (ref 20.0–28.0)
O2 SAT: 93 %
PH ART: 7.222 — AB (ref 7.350–7.450)
PO2 ART: 84 mmHg (ref 83.0–108.0)
TCO2: 31 mmol/L (ref 0–100)
pCO2 arterial: 71.3 mmHg (ref 32.0–48.0)

## 2016-03-02 LAB — PROTIME-INR
INR: 1.19
Prothrombin Time: 15.1 seconds (ref 11.4–15.2)

## 2016-03-02 LAB — COMPREHENSIVE METABOLIC PANEL
ALK PHOS: 103 U/L (ref 38–126)
ALT: 34 U/L (ref 17–63)
AST: 38 U/L (ref 15–41)
Albumin: 3.9 g/dL (ref 3.5–5.0)
Anion gap: 7 (ref 5–15)
BUN: 16 mg/dL (ref 6–20)
CALCIUM: 9.2 mg/dL (ref 8.9–10.3)
CO2: 27 mmol/L (ref 22–32)
CREATININE: 1.13 mg/dL (ref 0.61–1.24)
Chloride: 105 mmol/L (ref 101–111)
Glucose, Bld: 148 mg/dL — ABNORMAL HIGH (ref 65–99)
Potassium: 5.1 mmol/L (ref 3.5–5.1)
Sodium: 139 mmol/L (ref 135–145)
Total Bilirubin: 1.2 mg/dL (ref 0.3–1.2)
Total Protein: 7.3 g/dL (ref 6.5–8.1)

## 2016-03-02 LAB — CBC
HCT: 44.2 % (ref 39.0–52.0)
Hemoglobin: 13.6 g/dL (ref 13.0–17.0)
MCH: 29.3 pg (ref 26.0–34.0)
MCHC: 30.8 g/dL (ref 30.0–36.0)
MCV: 95.3 fL (ref 78.0–100.0)
PLATELETS: 153 10*3/uL (ref 150–400)
RBC: 4.64 MIL/uL (ref 4.22–5.81)
RDW: 14.9 % (ref 11.5–15.5)
WBC: 14.9 10*3/uL — AB (ref 4.0–10.5)

## 2016-03-02 LAB — APTT: APTT: 32 s (ref 24–36)

## 2016-03-02 LAB — BRAIN NATRIURETIC PEPTIDE: B Natriuretic Peptide: 361.9 pg/mL — ABNORMAL HIGH (ref 0.0–100.0)

## 2016-03-02 LAB — I-STAT TROPONIN, ED: Troponin i, poc: 0.01 ng/mL (ref 0.00–0.08)

## 2016-03-02 LAB — LIPASE, BLOOD: Lipase: 41 U/L (ref 11–51)

## 2016-03-02 MED ORDER — ALBUTEROL (5 MG/ML) CONTINUOUS INHALATION SOLN
5.0000 mg/h | INHALATION_SOLUTION | RESPIRATORY_TRACT | Status: DC
Start: 2016-03-02 — End: 2016-03-04
  Administered 2016-03-02: 5 mg/h via RESPIRATORY_TRACT

## 2016-03-02 MED ORDER — AZITHROMYCIN 500 MG IV SOLR
500.0000 mg | Freq: Once | INTRAVENOUS | Status: AC
  Administered 2016-03-02: 500 mg via INTRAVENOUS
  Filled 2016-03-02: qty 500

## 2016-03-02 MED ORDER — INSULIN ASPART 100 UNIT/ML ~~LOC~~ SOLN
0.0000 [IU] | Freq: Three times a day (TID) | SUBCUTANEOUS | Status: DC
  Administered 2016-03-03: 5 [IU] via SUBCUTANEOUS
  Administered 2016-03-04: 2 [IU] via SUBCUTANEOUS
  Administered 2016-03-04: 1 [IU] via SUBCUTANEOUS
  Administered 2016-03-04: 2 [IU] via SUBCUTANEOUS
  Administered 2016-03-05 (×2): 3 [IU] via SUBCUTANEOUS
  Administered 2016-03-05: 7 [IU] via SUBCUTANEOUS

## 2016-03-02 MED ORDER — IPRATROPIUM-ALBUTEROL 0.5-2.5 (3) MG/3ML IN SOLN
3.0000 mL | Freq: Four times a day (QID) | RESPIRATORY_TRACT | Status: DC
  Administered 2016-03-03 – 2016-03-06 (×13): 3 mL via RESPIRATORY_TRACT
  Filled 2016-03-02 (×13): qty 3

## 2016-03-02 MED ORDER — VITAMIN B-1 100 MG PO TABS
100.0000 mg | ORAL_TABLET | Freq: Every day | ORAL | Status: DC
  Administered 2016-03-03 – 2016-03-10 (×7): 100 mg via ORAL
  Filled 2016-03-02 (×7): qty 1

## 2016-03-02 MED ORDER — LORAZEPAM 1 MG PO TABS: 1.0000 mg | ORAL_TABLET | Freq: Four times a day (QID) | ORAL | Status: AC | PRN

## 2016-03-02 MED ORDER — IPRATROPIUM BROMIDE 0.02 % IN SOLN
1.5000 mg | Freq: Once | RESPIRATORY_TRACT | Status: AC
  Administered 2016-03-02: 1.5 mg via RESPIRATORY_TRACT
  Filled 2016-03-02: qty 7.5

## 2016-03-02 MED ORDER — ATORVASTATIN CALCIUM 20 MG PO TABS
20.0000 mg | ORAL_TABLET | Freq: Every day | ORAL | Status: DC
  Administered 2016-03-03 – 2016-03-05 (×3): 20 mg via ORAL
  Filled 2016-03-02 (×3): qty 1

## 2016-03-02 MED ORDER — LORAZEPAM 2 MG/ML IJ SOLN: 1.0000 mg | Freq: Four times a day (QID) | INTRAMUSCULAR | Status: AC | PRN

## 2016-03-02 MED ORDER — APIXABAN 5 MG PO TABS
5.0000 mg | ORAL_TABLET | Freq: Two times a day (BID) | ORAL | Status: DC
  Administered 2016-03-03: 5 mg via ORAL
  Filled 2016-03-02: qty 1

## 2016-03-02 MED ORDER — FUROSEMIDE 10 MG/ML IJ SOLN
40.0000 mg | Freq: Once | INTRAMUSCULAR | Status: AC
  Administered 2016-03-02: 40 mg via INTRAVENOUS
  Filled 2016-03-02: qty 4

## 2016-03-02 MED ORDER — IPRATROPIUM-ALBUTEROL 0.5-2.5 (3) MG/3ML IN SOLN: 3.0000 mL | RESPIRATORY_TRACT | Status: DC | PRN

## 2016-03-02 MED ORDER — ALBUTEROL (5 MG/ML) CONTINUOUS INHALATION SOLN
10.0000 mg/h | INHALATION_SOLUTION | RESPIRATORY_TRACT | Status: DC
  Administered 2016-03-02: 10 mg/h via RESPIRATORY_TRACT

## 2016-03-02 MED ORDER — ADULT MULTIVITAMIN W/MINERALS CH
1.0000 | ORAL_TABLET | Freq: Every day | ORAL | Status: DC
  Administered 2016-03-03 – 2016-03-10 (×7): 1 via ORAL
  Filled 2016-03-02 (×7): qty 1

## 2016-03-02 MED ORDER — ASPIRIN 81 MG PO CHEW
324.0000 mg | CHEWABLE_TABLET | Freq: Once | ORAL | Status: AC
  Administered 2016-03-02: 324 mg via ORAL
  Filled 2016-03-02: qty 4

## 2016-03-02 MED ORDER — SODIUM CHLORIDE 0.9% FLUSH
3.0000 mL | INTRAVENOUS | Status: DC | PRN
Start: 2016-03-02 — End: 2016-03-04

## 2016-03-02 MED ORDER — DIGOXIN 125 MCG PO TABS
0.1250 mg | ORAL_TABLET | Freq: Every day | ORAL | Status: DC
  Administered 2016-03-03 – 2016-03-09 (×8): 0.125 mg via ORAL
  Filled 2016-03-02 (×8): qty 1

## 2016-03-02 MED ORDER — THIAMINE HCL 100 MG/ML IJ SOLN
100.0000 mg | Freq: Every day | INTRAMUSCULAR | Status: DC
  Administered 2016-03-04: 100 mg via INTRAVENOUS
  Filled 2016-03-02: qty 2

## 2016-03-02 MED ORDER — ALBUTEROL (5 MG/ML) CONTINUOUS INHALATION SOLN
INHALATION_SOLUTION | RESPIRATORY_TRACT | Status: AC
  Administered 2016-03-02: 10 mg/h via RESPIRATORY_TRACT
  Filled 2016-03-02: qty 20

## 2016-03-02 MED ORDER — SODIUM CHLORIDE 0.9% FLUSH
3.0000 mL | Freq: Two times a day (BID) | INTRAVENOUS | Status: DC
  Administered 2016-03-02 – 2016-03-03 (×3): 3 mL via INTRAVENOUS

## 2016-03-02 MED ORDER — SODIUM CHLORIDE 0.9 % IV SOLN
250.0000 mL | INTRAVENOUS | Status: DC | PRN
Start: 2016-03-02 — End: 2016-03-04

## 2016-03-02 MED ORDER — CARVEDILOL 6.25 MG PO TABS
6.2500 mg | ORAL_TABLET | Freq: Two times a day (BID) | ORAL | Status: DC
  Administered 2016-03-03 – 2016-03-10 (×13): 6.25 mg via ORAL
  Filled 2016-03-02 (×13): qty 1

## 2016-03-02 MED ORDER — PREDNISONE 20 MG PO TABS
60.0000 mg | ORAL_TABLET | Freq: Once | ORAL | Status: AC
  Administered 2016-03-02: 60 mg via ORAL
  Filled 2016-03-02: qty 3

## 2016-03-02 MED ORDER — SPIRONOLACTONE 25 MG PO TABS
25.0000 mg | ORAL_TABLET | Freq: Every day | ORAL | Status: DC
  Administered 2016-03-03 – 2016-03-09 (×8): 25 mg via ORAL
  Filled 2016-03-02 (×8): qty 1

## 2016-03-02 MED ORDER — METHYLPREDNISOLONE SODIUM SUCC 125 MG IJ SOLR
60.0000 mg | Freq: Three times a day (TID) | INTRAMUSCULAR | Status: DC
  Administered 2016-03-03 – 2016-03-04 (×4): 60 mg via INTRAVENOUS
  Filled 2016-03-02 (×4): qty 2

## 2016-03-02 MED ORDER — FENOFIBRATE 54 MG PO TABS
54.0000 mg | ORAL_TABLET | Freq: Every day | ORAL | Status: DC
  Administered 2016-03-03 – 2016-03-10 (×7): 54 mg via ORAL
  Filled 2016-03-02 (×7): qty 1

## 2016-03-02 MED ORDER — ONDANSETRON HCL 4 MG/2ML IJ SOLN: 4.0000 mg | Freq: Four times a day (QID) | INTRAMUSCULAR | Status: DC | PRN

## 2016-03-02 MED ORDER — AZITHROMYCIN 250 MG PO TABS
250.0000 mg | ORAL_TABLET | Freq: Every day | ORAL | Status: DC
  Filled 2016-03-02: qty 1

## 2016-03-02 MED ORDER — ACETAMINOPHEN 325 MG PO TABS: 650.0000 mg | ORAL_TABLET | ORAL | Status: DC | PRN

## 2016-03-02 MED ORDER — FUROSEMIDE 10 MG/ML IJ SOLN
40.0000 mg | Freq: Two times a day (BID) | INTRAMUSCULAR | Status: DC
  Administered 2016-03-03: 40 mg via INTRAVENOUS
  Filled 2016-03-02: qty 4

## 2016-03-02 MED ORDER — FOLIC ACID 1 MG PO TABS
1.0000 mg | ORAL_TABLET | Freq: Every day | ORAL | Status: DC
  Administered 2016-03-03 – 2016-03-10 (×7): 1 mg via ORAL
  Filled 2016-03-02 (×7): qty 1

## 2016-03-02 MED ORDER — NITROGLYCERIN 0.4 MG SL SUBL: 0.4000 mg | SUBLINGUAL_TABLET | SUBLINGUAL | Status: DC | PRN

## 2016-03-02 NOTE — Progress Notes (Signed)
Critical ABG results given to Jonette Eva, MD. Orders received for BiPAP and additional continuous neb. Patient placed on BiPAP and tolerating well. RT will continue to monitor.

## 2016-03-02 NOTE — ED Notes (Addendum)
Clicked off DG portable in error

## 2016-03-02 NOTE — ED Triage Notes (Signed)
Per EMS- pt has had N/V for 3 days. Pt has also had a cough and noted to be wheezing. Pt was 85% on room air pt was placed on 2L. Pt denies pain. Family also reports that his hernia was worse than normal.

## 2016-03-02 NOTE — Progress Notes (Signed)
Patient transported on BiPap from ED to 3S-10 without complication.

## 2016-03-02 NOTE — ED Notes (Signed)
MD Katrinka Blazing at bedside to evaluate.

## 2016-03-02 NOTE — H&P (Signed)
History and Physical    [name] NGB:[id number] DOB: 05/02/1946 DOA: 03/02/2016  Referring MD/NP/PA:Dr. Jonette Eva, Resident PCP: Cain Saupe, MD  Patient coming from: Home  Chief Complaint: Shortness of breath  HPI: [name] is a 70 y.o. male with medical history significant of COPD, chronic respiratory failure on home oxygen, systolic CHF last EF 20-25% in 05/2015, mixed cardiomyopathy, A. fib on Eliquis, CAD s/p PCI, s/p  MV replacement, CVA, PVD, HLD; presents with complaints of shortness of breath.  Patient reports that he had recently stopped taking some of his medications over the last 2 days because they made him urinate too much. He reports that he continues to intermittently use cigarettes, and last smoked 2 days ago. He notes that  his weight is somewhere around 240. Per review of records patient has no showed for his last 2 appointments with cardiology. Followed by Dr. Kittie Plater of cardiology as an outpatient. He currently denies having any chest pain at this time, but has intermittently noted chest pain over the last month. Associated symptoms include leg swelling, productive cough, wheezing, and shortness of breath worsened with exertion.  ED Course: On admission into the emergency department patient was evaluated anoted to have O2 saturations as low as 79% on nasal cannula oxygen. Initial ABG showed pH of 7.22, PCO2 71.3, PO2 84. Lab work revealed WBC of 14.9, and all other lab work was relatively within normal limits. Chest x-ray showed bilateral air space opacities suggestive of edema more so over multi-lobar pneumonia. Patient was given a continuous albuterol, Solu-Medrol IV, azithromycin IV. Patient's work of breathing and O2 saturations improved once patient was placed to BiPAP. TRH called to admit.  Review of Systems: As per HPI otherwise 10 point review of systems negative.   Past Medical History:  Diagnosis Date  . AICD (automatic cardioverter/defibrillator)  present   . Alcohol abuse   . Anxiety   . Bipolar disorder (HCC)   . Biventricular automatic implantable cardioverter defibrillator in situ    a. 01/2014 s/p MDT [id number] Viva XT CRT-D (ser # [id number]).  . CAD (coronary artery disease)    a. s/p prior PCI/stenting of the LCX;  b. 08/2014 MV: EF 20%, large septal, apical, and inferior infarct from apex to base, no ischemia;  c. 08/2014 NSTEMI/Cath: LM mod distal dzs extending into ostial LCX (80%), LAD tortuous, RI nl, OM mod dzs, RCA 100 CTO with R->R and L->R collats-->Med Rx.  . Chronic atrial fibrillation (HCC)    a. CHA2DS2VASc = 6->eliquis;  b. S/P AVN RFCA an BiV ICD placement.  . Chronic respiratory failure (HCC)   . Chronic systolic congestive heart failure (HCC)    a. 01/2014 Echo: EF 25-30%, mod conc LVH, mod dil LA.  . CKD (chronic kidney disease)    a. suspect stage II-III based on historical labs.  . Complete heart block (HCC)    a. In setting of prior AV nodal ablation r/t afib-->BiV ICD (01/2014).  . COPD (chronic obstructive pulmonary disease) (HCC)   . CVA (cerebral vascular accident) (HCC)    a. Multiple prior embolic strokes.  . Dementia   . Depression   . DVT (deep venous thrombosis) (HCC)   . Dyslipidemia   . Hyperglycemia   . Hypertension   . Mitral valve disease    a. remote mitral replacement with Bjork Shiley valve;  b. 06/2006 Redo MVR with tissue valve.  . Mixed Ischemic and Nonischemic Cardiomyopathy    a. 8/.2015 Echo: Ef  25-30%;  b. 01/2014 s/p MDT [id number] Talbot Grumbling XT CRT-D (ser # [id number]).  . On home oxygen therapy    "3L at night" (11/23/2015)  . Ventral hernia     Past Surgical History:  Procedure Laterality Date  . AV NODE ABLATION  2007  . CARDIAC CATHETERIZATION  06/2006   Mild ostial L main stenosis, CFX stent patent, RCA occluded (old)  . CORONARY ARTERY BYPASS GRAFT  2008  . CYSTOSCOPY W/ URETERAL STENT PLACEMENT  07/12/2011   Procedure: CYSTOSCOPY WITH RETROGRADE PYELOGRAM/URETERAL STENT PLACEMENT;   Surgeon: Sebastian Ache, MD;  Location: [facility] OR;  Service: Urology;  Laterality: Left;  . HERNIA REPAIR    . ICD GENERATOR CHANGE  02/01/2014   Gen change to: Medtronic VIVA pulse generator, serial number [id number]  . INSERT / REPLACE / REMOVE PACEMAKER    . LEFT HEART CATHETERIZATION WITH CORONARY ANGIOGRAM N/A 08/23/2014   Procedure: LEFT HEART CATHETERIZATION WITH CORONARY ANGIOGRAM;  Surgeon: Corky Crafts, MD;  Location: [facility] CATH LAB;  Service: Cardiovascular;  Laterality: N/A;  . MITRAL VALVE REPLACEMENT     remote [facility] valve 5621 with redo tissue valve 06/2006  . PACEMAKER GENERATOR CHANGE N/A 02/01/2014   Procedure: PACEMAKER GENERATOR CHANGE;  Surgeon: Duke Salvia, MD;  Location: [facility] CATH LAB;  Service: Cardiovascular;  Laterality: N/A;  . PACEMAKER PLACEMENT  2006   Changed to CRT-D in 2009     reports that he has been smoking Cigarettes.  He has a 42.00 pack-year smoking history. He has never used smokeless tobacco. He reports that he drinks alcohol. He reports that he does not use drugs.  Allergies  Allergen Reactions  . Warfarin Other (See Comments)    Non compliance and ETOH abuse    Family History  Problem Relation Age of Onset  . Alzheimer's disease Mother   . Heart attack Father   . Heart disease Father   . Heart attack Son   . Varicose Veins Son   . Deep vein thrombosis Son   . Heart disease Son   . Stroke    . Heart disease    . Heart disease Brother     Prior to Admission medications   Medication Sig Start Date End Date Taking? Authorizing Provider  albuterol (PROVENTIL HFA;VENTOLIN HFA) 108 (90 Base) MCG/ACT inhaler Inhale 2 puffs into the lungs every 4 (four) hours as needed for wheezing or shortness of breath.   Yes Historical Provider, MD  apixaban (ELIQUIS) 5 MG TABS tablet Take 1 tablet (5 mg total) by mouth 2 (two) times daily. Patient taking differently: Take 5 mg by mouth every morning.  10/23/15  Yes Graciella Freer, PA-C    atorvastatin (LIPITOR) 20 MG tablet Take 1 tablet (20 mg total) by mouth daily. 10/23/15  Yes Graciella Freer, PA-C  carvedilol (COREG) 6.25 MG tablet Take 1 tablet (6.25 mg total) by mouth 2 (two) times daily with a meal. Patient taking differently: Take 12.5 mg by mouth at bedtime.  10/23/15  Yes Graciella Freer, PA-C  digoxin (LANOXIN) 0.125 MG tablet Take 1 tablet (0.125 mg total) by mouth daily. Patient taking differently: Take 0.125 mg by mouth at bedtime.  10/23/15  Yes Graciella Freer, PA-C  fenofibrate (TRICOR) 48 MG tablet Take 1 tablet (48 mg total) by mouth daily. Reported on 05/28/2015 10/23/15  Yes Graciella Freer, PA-C  furosemide (LASIX) 20 MG tablet Take 2 tablets (40 mg total) by mouth 2 (two) times daily. 10/23/15  Yes Mariam DollarMichael Andrew Tillery, PA-C  loratadine (CLARITIN) 10 MG tablet Take 10 mg by mouth daily as needed for allergies.   Yes Historical Provider, MD  losartan (COZAAR) 50 MG tablet Take 1 tablet (50 mg total) by mouth daily. Patient taking differently: Take 50 mg by mouth at bedtime.  11/23/15  Yes Dayna N Dunn, PA-C  Multiple Vitamin (MULTIVITAMIN WITH MINERALS) TABS tablet Take 1 tablet by mouth daily. 05/24/15  Yes Osvaldo ShipperGokul Krishnan, MD  phenylephrine (SUDAFED PE) 10 MG TABS tablet Take 10 mg by mouth every 6 (six) hours as needed (for cold symptoms).   Yes Historical Provider, MD  ranitidine (ZANTAC) 150 MG tablet Take 150 mg by mouth daily as needed for heartburn.   Yes Historical Provider, MD  spironolactone (ALDACTONE) 25 MG tablet Take 1 tablet (25 mg total) by mouth daily. Patient taking differently: Take 25 mg by mouth at bedtime.  10/23/15  Yes Graciella FreerMichael Andrew Tillery, PA-C  thiamine 100 MG tablet Take 1 tablet (100 mg total) by mouth daily. 05/24/15  Yes Osvaldo ShipperGokul Krishnan, MD  isosorbide mononitrate (IMDUR) 30 MG 24 hr tablet Take 1 tablet (30 mg total) by mouth daily. Patient not taking: Reported on 03/02/2016 11/23/15   Dayna N Dunn, PA-C   nitroGLYCERIN (NITROSTAT) 0.4 MG SL tablet Place 1 tablet (0.4 mg total) under the tongue every 5 (five) minutes as needed for chest pain. 01/06/15   Brittainy Sherlynn CarbonM Simmons, PA-C    Physical Exam:    Constitutional: NAD, calm, comfortable Vitals:   03/02/16 2119 03/02/16 2130 03/02/16 2147 03/02/16 2215  BP:  127/67    Pulse:  60 66   Resp:  24 26 (!) 25  Temp:      TempSrc:      SpO2: 95% 90% 98%    Eyes: PERRL, lids and conjunctivae normal ENMT: Mucous membranes are moist. Posterior pharynx clear of any exudate or lesions.Normal dentition.  Neck: normal, supple, no masses, no thyromegaly Respiratory: clear to auscultation bilaterally, no wheezing, no crackles. Normal respiratory effort. No accessory muscle use.  Cardiovascular: Regular rate and rhythm, no murmurs / rubs / gallops. No extremity edema. 2+ pedal pulses. No carotid bruits.  Abdomen: Ventral abdominal hernia present, no tenderness, no masses palpated. No hepatosplenomegaly. Bowel sounds positive.  Musculoskeletal: no clubbing / cyanosis. No joint deformity upper and lower extremities. Good ROM, no contractures. Normal muscle tone.  Skin: no rashes, lesions, ulcers. No induration Neurologic: CN 2-12 grossly intact. Sensation intact, DTR normal. Strength 5/5 in all 4.  Psychiatric: Normal judgment and insight. Alert and oriented x 3. Normal mood.     Labs on Admission: I have personally reviewed following labs and imaging studies  CBC:  Recent Labs Lab 03/02/16 1940  WBC 14.9*  HGB 13.6  HCT 44.2  MCV 95.3  PLT 153   Basic Metabolic Panel:  Recent Labs Lab 03/02/16 1940  NA 139  K 5.1  CL 105  CO2 27  GLUCOSE 148*  BUN 16  CREATININE 1.13  CALCIUM 9.2   GFR: CrCl cannot be calculated (Unknown ideal weight.). Liver Function Tests:  Recent Labs Lab 03/02/16 1940  AST 38  ALT 34  ALKPHOS 103  BILITOT 1.2  PROT 7.3  ALBUMIN 3.9    Recent Labs Lab 03/02/16 1940  LIPASE 41   No results  for input(s): AMMONIA in the last 168 hours. Coagulation Profile:  Recent Labs Lab 03/02/16 1940  INR 1.19   Cardiac Enzymes: No results for input(s): CKTOTAL,  CKMB, CKMBINDEX, TROPONINI in the last 168 hours. BNP (last 3 results) No results for input(s): PROBNP in the last 8760 hours. HbA1C: No results for input(s): HGBA1C in the last 72 hours. CBG: No results for input(s): GLUCAP in the last 168 hours. Lipid Profile: No results for input(s): CHOL, HDL, LDLCALC, TRIG, CHOLHDL, LDLDIRECT in the last 72 hours. Thyroid Function Tests: No results for input(s): TSH, T4TOTAL, FREET4, T3FREE, THYROIDAB in the last 72 hours. Anemia Panel: No results for input(s): VITAMINB12, FOLATE, FERRITIN, TIBC, IRON, RETICCTPCT in the last 72 hours. Urine analysis:    Component Value Date/Time   COLORURINE YELLOW 12/24/2014 0311   APPEARANCEUR CLEAR 12/24/2014 0311   LABSPEC 1.010 12/24/2014 0311   PHURINE 5.5 12/24/2014 0311   GLUCOSEU NEGATIVE 12/24/2014 0311   HGBUR NEGATIVE 12/24/2014 0311   BILIRUBINUR NEGATIVE 12/24/2014 0311   KETONESUR NEGATIVE 12/24/2014 0311   PROTEINUR NEGATIVE 12/24/2014 0311   UROBILINOGEN 0.2 12/24/2014 0311   NITRITE NEGATIVE 12/24/2014 0311   LEUKOCYTESUR NEGATIVE 12/24/2014 0311   Sepsis Labs: No results found for this or any previous visit (from the past 240 hour(s)).   Radiological Exams on Admission: Dg Chest Portable 1 View  Result Date: 03/02/2016 CLINICAL DATA:  Nausea and vomiting for 3 days. Cough. Wheezing. Hypoxia. EXAM: PORTABLE CHEST 1 VIEW COMPARISON:  11/22/2015 FINDINGS: Pacer/defibrillator leads appear stable. Prosthetic tricuspid valve. Moderate enlargement of the cardiopericardial silhouette with low lung volumes and bilateral interstitial and patchy airspace opacities. Mildly elevated right hemidiaphragm. Calcifications along the left hemidiaphragm. Old right rib deformities, healed. IMPRESSION: 1. Bilateral airspace opacities, left greater  than right, especially in the perihilar region, accompanied by indistinct pulmonary vasculature and moderate enlargement of the cardiopericardial silhouette, favoring acute pulmonary edema over multilobar pneumonia. 2. Elevated right hemidiaphragm. 3. Stable appearance of the pacer/defibrillator leads and prosthetic mitral valve. Electronically Signed   By: Gaylyn Rong M.D.   On: 03/02/2016 20:21    EKG: Independently reviewed. similar to previous tracings  Assessment/Plan  Acute on chronic respiratory failure with hypercapnia/ systolic CHF exacerbation/ COPD exacerbation: Followed by [name] as outpaitent. His last echocardiogram showed an EF 20-25% performed in 05/2015. Patient noted to be noncompliant with medications and follow-up as factors in associated factors. - Admit to stepdown - Continuous pulse oximetry - Continue BiPAP for now, and wean as tolerated - Duonebs qid /prn - Solu-Medrol 60 mg IV every 8hrs - continue Azithromycin - Strict intake and output, and daily weights - Lasix 40 mg IV every 12 hours - Echocardiogram in a.m.  - Trend Cardiac enzymes  - Will need to consult Cardiology in a.m. patient sees [name]  Essential hypertension - Continued Coreg per written prescription (note patient taking 12.5 mg at night)  Hyperglycemia - Sensitive SSI q AC    Chronic atrial fibrillation - Continue Eliquis per written prescription (Patient only taking once daily) - Continue Coreg and digoxin   Alcohol abuse history - CWIA protocol initiated  Dyslipidemia - Continue atorvastatin and fenofibrate   DVT prophylaxis: on eliquis Code Status: Full  Family Communication:  No family present at bedside Disposition Plan: Possible discharge home once medically stable Consults called: None Admission status:  Inpatient stepdown  Clydie Braun MD [name] Pager [phone]  If 7PM-7AM, please contact night-coverage [email or web address] Password  [facility]  03/02/2016, 10:39 PM

## 2016-03-03 ENCOUNTER — Inpatient Hospital Stay (HOSPITAL_COMMUNITY): Payer: Medicare Other

## 2016-03-03 DIAGNOSIS — I482 Chronic atrial fibrillation: Secondary | ICD-10-CM

## 2016-03-03 DIAGNOSIS — J441 Chronic obstructive pulmonary disease with (acute) exacerbation: Secondary | ICD-10-CM

## 2016-03-03 DIAGNOSIS — I5023 Acute on chronic systolic (congestive) heart failure: Secondary | ICD-10-CM

## 2016-03-03 LAB — GLUCOSE, CAPILLARY
GLUCOSE-CAPILLARY: 254 mg/dL — AB (ref 65–99)
Glucose-Capillary: 209 mg/dL — ABNORMAL HIGH (ref 65–99)

## 2016-03-03 LAB — CBC WITH DIFFERENTIAL/PLATELET
Basophils Absolute: 0 10*3/uL (ref 0.0–0.1)
Basophils Relative: 0 %
EOS ABS: 0 10*3/uL (ref 0.0–0.7)
EOS PCT: 0 %
HCT: 43.4 % (ref 39.0–52.0)
Hemoglobin: 13.1 g/dL (ref 13.0–17.0)
LYMPHS ABS: 0.6 10*3/uL — AB (ref 0.7–4.0)
Lymphocytes Relative: 5 %
MCH: 28.9 pg (ref 26.0–34.0)
MCHC: 30.2 g/dL (ref 30.0–36.0)
MCV: 95.6 fL (ref 78.0–100.0)
MONO ABS: 0.3 10*3/uL (ref 0.1–1.0)
Monocytes Relative: 2 %
Neutro Abs: 10.9 10*3/uL — ABNORMAL HIGH (ref 1.7–7.7)
Neutrophils Relative %: 93 %
PLATELETS: 138 10*3/uL — AB (ref 150–400)
RBC: 4.54 MIL/uL (ref 4.22–5.81)
RDW: 15 % (ref 11.5–15.5)
WBC: 11.7 10*3/uL — AB (ref 4.0–10.5)

## 2016-03-03 LAB — MAGNESIUM: Magnesium: 1.9 mg/dL (ref 1.7–2.4)

## 2016-03-03 LAB — BASIC METABOLIC PANEL
Anion gap: 6 (ref 5–15)
BUN: 19 mg/dL (ref 6–20)
CALCIUM: 9.2 mg/dL (ref 8.9–10.3)
CO2: 28 mmol/L (ref 22–32)
CREATININE: 1.3 mg/dL — AB (ref 0.61–1.24)
Chloride: 105 mmol/L (ref 101–111)
GFR calc non Af Amer: 55 mL/min — ABNORMAL LOW (ref >60)
Glucose, Bld: 172 mg/dL — ABNORMAL HIGH (ref 65–99)
Potassium: 5 mmol/L (ref 3.5–5.1)
SODIUM: 139 mmol/L (ref 135–145)

## 2016-03-03 LAB — MRSA PCR SCREENING: MRSA BY PCR: NEGATIVE

## 2016-03-03 LAB — TROPONIN I
TROPONIN I: 0.05 ng/mL — AB (ref <0.03)
Troponin I: 0.03 ng/mL (ref <0.03)
Troponin I: 0.03 ng/mL (ref <0.03)

## 2016-03-03 MED ORDER — ISOSORB DINITRATE-HYDRALAZINE 20-37.5 MG PO TABS: 1.0000 | ORAL_TABLET | Freq: Three times a day (TID) | ORAL | Status: DC

## 2016-03-03 MED ORDER — RIVAROXABAN 20 MG PO TABS
20.0000 mg | ORAL_TABLET | Freq: Every day | ORAL | Status: DC
  Administered 2016-03-03 – 2016-03-09 (×7): 20 mg via ORAL
  Filled 2016-03-03 (×7): qty 1

## 2016-03-03 MED ORDER — INFLUENZA VAC SPLIT QUAD 0.5 ML IM SUSY
0.5000 mL | PREFILLED_SYRINGE | INTRAMUSCULAR | Status: AC
  Administered 2016-03-06: 0.5 mL via INTRAMUSCULAR
  Filled 2016-03-03 (×2): qty 0.5

## 2016-03-03 MED ORDER — FUROSEMIDE 10 MG/ML IJ SOLN
40.0000 mg | Freq: Once | INTRAMUSCULAR | Status: AC
  Administered 2016-03-03: 40 mg via INTRAVENOUS
  Filled 2016-03-03: qty 4

## 2016-03-03 MED ORDER — LOSARTAN POTASSIUM 50 MG PO TABS
50.0000 mg | ORAL_TABLET | Freq: Every day | ORAL | Status: DC
  Administered 2016-03-03 – 2016-03-06 (×3): 50 mg via ORAL
  Filled 2016-03-03 (×3): qty 1

## 2016-03-03 MED ORDER — INSULIN ASPART 100 UNIT/ML ~~LOC~~ SOLN
3.0000 [IU] | Freq: Once | SUBCUTANEOUS | Status: AC
  Administered 2016-03-03: 3 [IU] via SUBCUTANEOUS

## 2016-03-03 MED ORDER — DEXTROSE 5 % IV SOLN
2.0000 g | INTRAVENOUS | Status: DC
  Administered 2016-03-03 – 2016-03-06 (×4): 2 g via INTRAVENOUS
  Filled 2016-03-03 (×5): qty 2

## 2016-03-03 MED ORDER — PNEUMOCOCCAL VAC POLYVALENT 25 MCG/0.5ML IJ INJ
0.5000 mL | INJECTION | INTRAMUSCULAR | Status: AC
  Administered 2016-03-06: 0.5 mL via INTRAMUSCULAR
  Filled 2016-03-03 (×2): qty 0.5

## 2016-03-03 MED ORDER — FUROSEMIDE 10 MG/ML IJ SOLN
80.0000 mg | Freq: Two times a day (BID) | INTRAMUSCULAR | Status: DC
  Administered 2016-03-03 – 2016-03-05 (×4): 80 mg via INTRAVENOUS
  Filled 2016-03-03 (×4): qty 8

## 2016-03-03 MED ORDER — AZITHROMYCIN 500 MG PO TABS
500.0000 mg | ORAL_TABLET | Freq: Every day | ORAL | Status: DC
  Administered 2016-03-03 – 2016-03-06 (×3): 500 mg via ORAL
  Filled 2016-03-03 (×4): qty 1

## 2016-03-03 NOTE — Progress Notes (Signed)
CRITICAL VALUE ALERT  Critical value received:  Troponin 0.05  Date of notification:  03/03/2016  Time of notification:  0344  Critical value read back:Yes.    Nurse who received alert:  Camelia Eng  MD notified (1st page):  Donnamarie Poag -NP on call   Time of first page:  [phone]  MD notified (2nd page):  Time of second page:  Responding MD:     Time MD responded:

## 2016-03-03 NOTE — Progress Notes (Signed)
PROGRESS NOTE                                                                                                                                                                                                             Patient Demographics:    [name], is a 70 y.o. male, DOB - [birth year], ZOX:[id number]  Admit date - 03/02/2016   Admitting Physician Clydie Braun, MD  Outpatient Primary MD for the patient is FULP, CAMMIE, MD  LOS - 1  Chief Complaint  Patient presents with  . Nausea  . Emesis       Brief Narrative   [name] is a 70 y.o. male with medical history significant of COPD, chronic respiratory failure on home oxygen, systolic CHF last EF 20-25% in 05/2015, mixed cardiomyopathy, A. fib on Eliquis, CAD s/p PCI, s/p  MV replacement, CVA, PVD, HLD; presents with complaints of shortness of breath.  Patient reports that he had recently stopped taking some of his medications over the last 2 days because they made him urinate too much.  He was diagnosed with acute on chronic hypoxic/hypercapnic respiratory failure with respiratory acidosis, he was placed on BiPAP and admitted to stepdown.    Subjective:    [name] today has, No headache, No chest pain, No abdominal pain - No Nausea, No new weakness tingling or numbness, No Cough - improved SOB.   Assessment  & Plan :     1. Acute on chronic hypoxic/hypercapnic respiratory failure with respiratory acidosis - due to combination of acute on chronic systolic CHF and COPD exacerbation.  Pneumonia cannot be ruled out, exam is inconclusive with both rales and wheezes, minimal edema, for now continue diuresis, have adjusted IV antibiotics, on IV steroids which will be continued, continue BiPAP and nebulizer treatments, repeat ABG in the morning, while on BiPAP clear liquids. Have consulted cardiology as well.   2. Acute on chronic systolic CHF EF  around 20%, noncompliant with medications and diuretics, counseled, has AICD. Continue diuretics, continue Coreg and Aldactone, currently not on ACE/ARB due to ARF, will add low-dose BiDil. Repeat echo pending.  3. Mild ARF. For now being diuresed monitor BMP closely.  4. Mild elevation in troponin. Chest pain-free, upon trend and non-ACS pattern likely due to demand ischemia from #1 above, and tinea Eliquis, beta blocker  and statin for secondary prevention. Cardiac evaluate. Repeat echocardiogram ordered to evaluate wall motion.  5. HX of alcohol abuse. Counseled to quit. On CIWA protocol.  6. Dyslipidemia on statin and fenofibrate.  7. Chronic atrial fibrillation. [name] 2 score of at least 4. On Coreg, digoxin and Eliquis.    Family Communication  : None  Code Status :  Full  Diet : Liquid while on BIPAP  Disposition Plan  :  Stepdown  Consults  :  Cards  Procedures  :    TTE  DVT Prophylaxis  :  Eliquis  Lab Results  Component Value Date   PLT 138 (L) 03/03/2016    Inpatient Medications  Scheduled Meds: . apixaban  5 mg Oral BID  . atorvastatin  20 mg Oral q1800  . azithromycin  500 mg Oral Daily  . carvedilol  6.25 mg Oral BID WC  . cefTRIAXone (ROCEPHIN)  IV  1 g Intravenous Q24H  . digoxin  0.125 mg Oral QHS  . fenofibrate  54 mg Oral Daily  . folic acid  1 mg Oral Daily  . furosemide  40 mg Intravenous Q12H  . insulin aspart  0-9 Units Subcutaneous TID WC  . ipratropium-albuterol  3 mL Nebulization QID  . methylPREDNISolone (SOLU-MEDROL) injection  60 mg Intravenous Q8H  . multivitamin with minerals  1 tablet Oral Daily  . sodium chloride flush  3 mL Intravenous Q12H  . spironolactone  25 mg Oral QHS  . thiamine  100 mg Oral Daily   Or  . thiamine  100 mg Intravenous Daily   Continuous Infusions: . albuterol Stopped (03/02/16 2047)  . albuterol 5 mg/hr (03/02/16 2120)   PRN Meds:.sodium chloride, acetaminophen, ipratropium-albuterol, LORazepam  **OR** LORazepam, nitroGLYCERIN, ondansetron (ZOFRAN) IV, sodium chloride flush  Antibiotics  :    Anti-infectives    Start     Dose/Rate Route Frequency Ordered Stop   03/03/16 1000  azithromycin (ZITHROMAX) tablet 250 mg  Status:  Discontinued     250 mg Oral Daily 03/02/16 2327 03/03/16 0936   03/03/16 1000  azithromycin (ZITHROMAX) tablet 500 mg     500 mg Oral Daily 03/03/16 0936     03/03/16 0945  cefTRIAXone (ROCEPHIN) 1 g in dextrose 5 % 50 mL IVPB     1 g 100 mL/hr over 30 Minutes Intravenous Every 24 hours 03/03/16 0937     03/02/16 2015  azithromycin (ZITHROMAX) 500 mg in dextrose 5 % 250 mL IVPB     500 mg 250 mL/hr over 60 Minutes Intravenous  Once 03/02/16 2012 03/02/16 2230         Objective:   Vitals:   03/03/16 0336 03/03/16 0500 03/03/16 0727 03/03/16 0755  BP: 140/78   134/70  Pulse: 62  60 60  Resp:   19 19  Temp:    97.3 F (36.3 C)  TempSrc:    Axillary  SpO2:   97% 97%  Weight:  78.5 kg (173 lb 1 oz)    Height:        Wt Readings from Last 3 Encounters:  03/03/16 78.5 kg (173 lb 1 oz)  11/22/15 74.7 kg (164 lb 9.6 oz)  05/24/15 71.3 kg (157 lb 3 oz)     Intake/Output Summary (Last 24 hours) at 03/03/16 0937 Last data filed at 03/03/16 0930  Gross per 24 hour  Intake                0 ml  Output  750 ml  Net             -750 ml     Physical Exam  Awake Alert, Oriented X 3, No new F.N deficits, Normal affect [place].AT,PERRAL Supple Neck,No JVD, No cervical lymphadenopathy appriciated.  Symmetrical Chest wall movement, Good air movement bilaterally, +ve rales and wheezes RRR,No Gallops,Rubs or new Murmurs, No Parasternal Heave +ve B.Sounds, Abd Soft, No tenderness, No organomegaly appriciated, No rebound - guarding or rigidity. No Cyanosis, Clubbing or edema, No new Rash or bruise      Data Review:    CBC  Recent Labs Lab 03/02/16 1940 03/03/16 0609  WBC 14.9* 11.7*  HGB 13.6 13.1  HCT 44.2 43.4  PLT 153 138*  MCV  95.3 95.6  MCH 29.3 28.9  MCHC 30.8 30.2  RDW 14.9 15.0  LYMPHSABS  --  0.6*  MONOABS  --  0.3  EOSABS  --  0.0  BASOSABS  --  0.0    Chemistries   Recent Labs Lab 03/02/16 1940 03/03/16 0609  NA 139 139  K 5.1 5.0  CL 105 105  CO2 27 28  GLUCOSE 148* 172*  BUN 16 19  CREATININE 1.13 1.30*  CALCIUM 9.2 9.2  MG  --  1.9  AST 38  --   ALT 34  --   ALKPHOS 103  --   BILITOT 1.2  --    ------------------------------------------------------------------------------------------------------------------ No results for input(s): CHOL, HDL, LDLCALC, TRIG, CHOLHDL, LDLDIRECT in the last 72 hours.  Lab Results  Component Value Date   HGBA1C  02/24/2008    5.8 (NOTE)   The ADA recommends the following therapeutic goal for glycemic   control related to Hgb A1C measurement:   Goal of Therapy:   < 7.0% Hgb A1C   Reference: American Diabetes Association: Clinical Practice   Recommendations 2008, Diabetes Care,  2008, 31:(Suppl 1).   ------------------------------------------------------------------------------------------------------------------ No results for input(s): TSH, T4TOTAL, T3FREE, THYROIDAB in the last 72 hours.  Invalid input(s): FREET3 ------------------------------------------------------------------------------------------------------------------ No results for input(s): VITAMINB12, FOLATE, FERRITIN, TIBC, IRON, RETICCTPCT in the last 72 hours.  Coagulation profile  Recent Labs Lab 03/02/16 1940  INR 1.19    No results for input(s): DDIMER in the last 72 hours.  Cardiac Enzymes  Recent Labs Lab 03/03/16 0139 03/03/16 0609  TROPONINI 0.05* 0.03*   ------------------------------------------------------------------------------------------------------------------    Component Value Date/Time   BNP 361.9 (H) 03/02/2016 1940    Micro Results Recent Results (from the past 240 hour(s))  MRSA PCR Screening     Status: None   Collection Time: 03/03/16   2:34 AM  Result Value Ref Range Status   MRSA by PCR NEGATIVE NEGATIVE Final    Comment:        The GeneXpert MRSA Assay (FDA approved for NASAL specimens only), is one component of a comprehensive MRSA colonization surveillance program. It is not intended to diagnose MRSA infection nor to guide or monitor treatment for MRSA infections.     Radiology Reports Dg Chest Port 1 View  Result Date: 03/03/2016 CLINICAL DATA:  Shortness of breath EXAM: PORTABLE CHEST 1 VIEW COMPARISON:  03/02/2016 FINDINGS: Chronic cardiomegaly. Direct and transvenous pacer/ ICD wires in unchanged position. Status post mitral valve replacement. Low volume chest with patchy bilateral opacity. Possible pleural fluid or thickening on the left. No visible pneumothorax. IMPRESSION: Unchanged bilateral lung opacity which could be infectious or congestive. Electronically Signed   By: Marnee Spring M.D.   On: 03/03/2016 07:23  Dg Chest Portable 1 View  Result Date: 03/02/2016 CLINICAL DATA:  Nausea and vomiting for 3 days. Cough. Wheezing. Hypoxia. EXAM: PORTABLE CHEST 1 VIEW COMPARISON:  11/22/2015 FINDINGS: Pacer/defibrillator leads appear stable. Prosthetic tricuspid valve. Moderate enlargement of the cardiopericardial silhouette with low lung volumes and bilateral interstitial and patchy airspace opacities. Mildly elevated right hemidiaphragm. Calcifications along the left hemidiaphragm. Old right rib deformities, healed. IMPRESSION: 1. Bilateral airspace opacities, left greater than right, especially in the perihilar region, accompanied by indistinct pulmonary vasculature and moderate enlargement of the cardiopericardial silhouette, favoring acute pulmonary edema over multilobar pneumonia. 2. Elevated right hemidiaphragm. 3. Stable appearance of the pacer/defibrillator leads and prosthetic mitral valve. Electronically Signed   By: Gaylyn Rong M.D.   On: 03/02/2016 20:21    Time Spent in minutes   30   Delesia Martinek K M.D on 03/03/2016 at 9:37 AM  Between 7am to 7pm - Pager - [phone]  After 7pm go to [email or web address] - password [facility]  [name] -  Office  [phone]

## 2016-03-03 NOTE — Care Management Note (Addendum)
Case Management Note  Patient Details  Name: [name] MRN: [id number] Date of Birth: 1945/10/11  Subjective/Objective:   Patient lives with son, [name].  [phone].  Patient has a rolling walker at home and home oxygen with [facility]  At 2 liters.  Per son patient was living with daughter and he was not taking his medications but patient will will staying with him now and he will make sure he takes his medications.  Patient is already on Eliquis for afib.  He has a PCP and he has medication coverage and he has transportation at discharge .  NCM will cont to follow for dc needs.              Action/Plan:   Expected Discharge Date:                  Expected Discharge Plan:  Home w Home Health Services  In-House Referral:     Discharge planning Services  CM Consult  Post Acute Care Choice:    Choice offered to:     DME Arranged:    DME Agency:     HH Arranged:    HH Agency:     Status of Service:  In process, will continue to follow  If discussed at Long Length of Stay Meetings, dates discussed:    Additional Comments:  [name], RN 03/03/2016, 12:59 PM

## 2016-03-03 NOTE — Consult Note (Signed)
Advanced Heart Failure Team Consult Note  Referring Physician: Dr. Thedore Mins Primary Physician: Cain Saupe Primary Cardiologist:  Dr. Shirlee Latch   Reason for Consultation: A/C systolic HF  HPI:    [name] is a 70 y.o. male with PMH of chronic Afib s/p AV node ablation and BiV pacing, Mixed ICM/NIMC, Chronic ETOH abuse, MV replacement x 2 and CAD.   Last seen in HF clinic 10/22/15 after recently having lasix increased.  He was asked to then decrease the dose with AKI, but patient has poor compliance and un-reliable social support so continued on the higher dose. He continued to smoke and drink ETOH at this visit.  Ongoing medical and dietary non-compliance. Weight at that visit 171 lbs. Non-compliant with cardiology follow up since then.   Pt presented to [facility] on 03/02/16 with c/p SOB after stopping some of his medications.  He states he had stopped his medications over the weekend as they were "making him urinate too much". Noted to be hypoxic with O2 sat down to 79% despite [place]. Initial ABG showed pH of 7.22, PCO2 71.3, PO2 84   Pertinent labs on admission include creatinine 1.13, K 5.1, BNP 361.9, Troponin flat. WBC 14.9. CXR showed unchanged bilateral opacity -> infection vs congestion.   Pt on BiPAP.  States he stopped his medications > 1 week ago as they were making him "pee too much." States he has been more SOB over the weekend with associated nausea, so came to ED.   Son present in room.  States patient has been back and forth between son and daughter house ( as his usual ). Daughter was recently in an MVA, so has had less help with his medications.  Drinks upwards of 3L a day (Drinks 3-4 32 oz cups "in no time").  Continues to smoke > 1 ppd.  Denies ETOH, son agrees he has not had any. Son states he "tries to make sure he takes his medications" but it is likely non-compliance has been greater than just 1 week. Gets SOB with mild exertion.  Wears 02 only at bedtime. (supposed to be on  chronically son believes).   Review of Systems: [y] = yes, [ ]  = no   General: Weight gain [y]; Weight loss [ ] ; Anorexia [ ] ; Fatigue [y]; Fever [ ] ; Chills [ ] ; Weakness [ ]   Cardiac: Chest pain/pressure [ ] ; Resting SOB [ ] ; Exertional SOB [y]; Orthopnea [ ] ; Pedal Edema [y]; Palpitations [ ] ; Syncope [ ] ; Presyncope [ ] ; Paroxysmal nocturnal dyspnea[ ]   Pulmonary: Cough [y]; Wheezing[ ] ; Hemoptysis[ ] ; Sputum [ ] ; Snoring [ ]   GI: Vomiting[ ] ; Dysphagia[ ] ; Melena[ ] ; Hematochezia [ ] ; Heartburn[ ] ; Abdominal pain [ ] ; Constipation [ ] ; Diarrhea [ ] ; BRBPR [ ]   GU: Hematuria[ ] ; Dysuria [ ] ; Nocturia[ ]   Vascular: Pain in legs with walking [ ] ; Pain in feet with lying flat [ ] ; Non-healing sores [ ] ; Stroke [ ] ; TIA [ ] ; Slurred speech [ ] ;  Neuro: Headaches[ ] ; Vertigo[ ] ; Seizures[ ] ; Paresthesias[ ] ;Blurred vision [ ] ; Diplopia [ ] ; Vision changes [ ]   Ortho/Skin: Arthritis [y]; Joint pain [y]; Muscle pain [ ] ; Joint swelling [ ] ; Back Pain [ ] ; Rash [ ]   Psych: Depression[ ] ; Anxiety[ ]   Heme: Bleeding problems [ ] ; Clotting disorders [ ] ; Anemia [ ]   Endocrine: Diabetes [ ] ; Thyroid dysfunction[ ]   Home Medications Prior to Admission medications   Medication Sig Start Date End  Date Taking? Authorizing Provider  albuterol (PROVENTIL HFA;VENTOLIN HFA) 108 (90 Base) MCG/ACT inhaler Inhale 2 puffs into the lungs every 4 (four) hours as needed for wheezing or shortness of breath.   Yes Historical Provider, MD  apixaban (ELIQUIS) 5 MG TABS tablet Take 1 tablet (5 mg total) by mouth 2 (two) times daily. Patient taking differently: Take 5 mg by mouth every morning.  10/23/15  Yes Graciella FreerMichael Andrew Tillery, PA-C  atorvastatin (LIPITOR) 20 MG tablet Take 1 tablet (20 mg total) by mouth daily. 10/23/15  Yes Graciella FreerMichael Andrew Tillery, PA-C  carvedilol (COREG) 6.25 MG tablet Take 1 tablet (6.25 mg total) by mouth 2 (two) times daily with a meal. Patient taking differently: Take 12.5 mg by mouth at  bedtime.  10/23/15  Yes Graciella FreerMichael Andrew Tillery, PA-C  digoxin (LANOXIN) 0.125 MG tablet Take 1 tablet (0.125 mg total) by mouth daily. Patient taking differently: Take 0.125 mg by mouth at bedtime.  10/23/15  Yes Graciella FreerMichael Andrew Tillery, PA-C  fenofibrate (TRICOR) 48 MG tablet Take 1 tablet (48 mg total) by mouth daily. Reported on 05/28/2015 10/23/15  Yes Graciella FreerMichael Andrew Tillery, PA-C  furosemide (LASIX) 20 MG tablet Take 2 tablets (40 mg total) by mouth 2 (two) times daily. 10/23/15  Yes Graciella FreerMichael Andrew Tillery, PA-C  loratadine (CLARITIN) 10 MG tablet Take 10 mg by mouth daily as needed for allergies.   Yes Historical Provider, MD  losartan (COZAAR) 50 MG tablet Take 1 tablet (50 mg total) by mouth daily. Patient taking differently: Take 50 mg by mouth at bedtime.  11/23/15  Yes Dayna N Dunn, PA-C  Multiple Vitamin (MULTIVITAMIN WITH MINERALS) TABS tablet Take 1 tablet by mouth daily. 05/24/15  Yes Osvaldo ShipperGokul Krishnan, MD  phenylephrine (SUDAFED PE) 10 MG TABS tablet Take 10 mg by mouth every 6 (six) hours as needed (for cold symptoms).   Yes Historical Provider, MD  ranitidine (ZANTAC) 150 MG tablet Take 150 mg by mouth daily as needed for heartburn.   Yes Historical Provider, MD  spironolactone (ALDACTONE) 25 MG tablet Take 1 tablet (25 mg total) by mouth daily. Patient taking differently: Take 25 mg by mouth at bedtime.  10/23/15  Yes Graciella FreerMichael Andrew Tillery, PA-C  thiamine 100 MG tablet Take 1 tablet (100 mg total) by mouth daily. 05/24/15  Yes Osvaldo ShipperGokul Krishnan, MD  isosorbide mononitrate (IMDUR) 30 MG 24 hr tablet Take 1 tablet (30 mg total) by mouth daily. Patient not taking: Reported on 03/02/2016 11/23/15   Dayna N Dunn, PA-C  nitroGLYCERIN (NITROSTAT) 0.4 MG SL tablet Place 1 tablet (0.4 mg total) under the tongue every 5 (five) minutes as needed for chest pain. 01/06/15   Brittainy Sherlynn CarbonM Simmons, PA-C    Past Medical History: Past Medical History:  Diagnosis Date  . AICD (automatic  cardioverter/defibrillator) present   . Alcohol abuse   . Anxiety   . Bipolar disorder (HCC)   . Biventricular automatic implantable cardioverter defibrillator in situ    a. 01/2014 s/p MDT [id number] Viva XT CRT-D (ser # [id number]).  . CAD (coronary artery disease)    a. s/p prior PCI/stenting of the LCX;  b. 08/2014 MV: EF 20%, large septal, apical, and inferior infarct from apex to base, no ischemia;  c. 08/2014 NSTEMI/Cath: LM mod distal dzs extending into ostial LCX (80%), LAD tortuous, RI nl, OM mod dzs, RCA 100 CTO with R->R and L->R collats-->Med Rx.  . Chronic atrial fibrillation (HCC)    a. CHA2DS2VASc = 6->eliquis;  b. S/P AVN RFCA  an BiV ICD placement.  . Chronic respiratory failure (HCC)   . Chronic systolic congestive heart failure (HCC)    a. 01/2014 Echo: EF 25-30%, mod conc LVH, mod dil LA.  . CKD (chronic kidney disease)    a. suspect stage II-III based on historical labs.  . Complete heart block (HCC)    a. In setting of prior AV nodal ablation r/t afib-->BiV ICD (01/2014).  . COPD (chronic obstructive pulmonary disease) (HCC)   . CVA (cerebral vascular accident) (HCC)    a. Multiple prior embolic strokes.  . Dementia   . Depression   . DVT (deep venous thrombosis) (HCC)   . Dyslipidemia   . Hyperglycemia   . Hypertension   . Mitral valve disease    a. remote mitral replacement with Bjork Shiley valve;  b. 06/2006 Redo MVR with tissue valve.  . Mixed Ischemic and Nonischemic Cardiomyopathy    a. 8/.2015 Echo: Ef 25-30%;  b. 01/2014 s/p MDT [id number] [place] Grumbling XT CRT-D (ser # [id number]).  . On home oxygen therapy    "3L at night" (11/23/2015)  . Ventral hernia     Past Surgical History: Past Surgical History:  Procedure Laterality Date  . AV NODE ABLATION  2007  . CARDIAC CATHETERIZATION  06/2006   Mild ostial L main stenosis, CFX stent patent, RCA occluded (old)  . CORONARY ARTERY BYPASS GRAFT  2008  . CYSTOSCOPY W/ URETERAL STENT PLACEMENT  07/12/2011   Procedure: CYSTOSCOPY  WITH RETROGRADE PYELOGRAM/URETERAL STENT PLACEMENT;  Surgeon: Sebastian Ache, MD;  Location: [facility] OR;  Service: Urology;  Laterality: Left;  . HERNIA REPAIR    . ICD GENERATOR CHANGE  02/01/2014   Gen change to: Medtronic VIVA pulse generator, serial number [id number]  . INSERT / REPLACE / REMOVE PACEMAKER    . LEFT HEART CATHETERIZATION WITH CORONARY ANGIOGRAM N/A 08/23/2014   Procedure: LEFT HEART CATHETERIZATION WITH CORONARY ANGIOGRAM;  Surgeon: Corky Crafts, MD;  Location: [facility] CATH LAB;  Service: Cardiovascular;  Laterality: N/A;  . MITRAL VALVE REPLACEMENT     remote [facility] valve 5621 with redo tissue valve 06/2006  . PACEMAKER GENERATOR CHANGE N/A 02/01/2014   Procedure: PACEMAKER GENERATOR CHANGE;  Surgeon: Duke Salvia, MD;  Location: [facility] CATH LAB;  Service: Cardiovascular;  Laterality: N/A;  . PACEMAKER PLACEMENT  2006   Changed to CRT-D in 2009    Family History: Family History  Problem Relation Age of Onset  . Alzheimer's disease Mother   . Heart attack Father   . Heart disease Father   . Heart attack Son   . Varicose Veins Son   . Deep vein thrombosis Son   . Heart disease Son   . Stroke    . Heart disease    . Heart disease Brother     Social History: Social History   Social History  . Marital status: Divorced    Spouse name: N/A  . Number of children: N/A  . Years of education: N/A   Occupational History  . Retired Therapist, music    Social History Main Topics  . Smoking status: Current Every Day Smoker    Packs/day: 1.00    Years: 42.00    Types: Cigarettes  . Smokeless tobacco: Never Used  . Alcohol use 0.0 oz/week     Comment: 11/22/2015 "6 pack of beer/month"  . Drug use: No  . Sexual activity: Not Currently   Other Topics Concern  . None   Social History Narrative   **  Merged History Encounter **        Allergies:  Allergies  Allergen Reactions  . Warfarin Other (See Comments)    Non compliance and ETOH abuse    Objective:     Vital Signs:   Temp:  [97.3 F (36.3 C)-99.2 F (37.3 C)] 97.3 F (36.3 C) (09/26 0755) Pulse Rate:  [59-75] 60 (09/26 0755) Resp:  [17-33] 19 (09/26 0755) BP: (118-179)/(64-98) 134/70 (09/26 0755) SpO2:  [79 %-98 %] 97 % (09/26 1126) FiO2 (%):  [40 %-50 %] 40 % (09/26 1126) Weight:  [172 lb 13.5 oz (78.4 kg)-173 lb 4.5 oz (78.6 kg)] 173 lb 1 oz (78.5 kg) (09/26 0500)    Weight change: Filed Weights   03/02/16 2224 03/02/16 2327 03/03/16 0500  Weight: 173 lb 4.5 oz (78.6 kg) 172 lb 13.5 oz (78.4 kg) 173 lb 1 oz (78.5 kg)    Intake/Output:   Intake/Output Summary (Last 24 hours) at 03/03/16 1127 Last data filed at 03/03/16 0930  Gross per 24 hour  Intake                0 ml  Output              750 ml  Net             -750 ml     Physical Exam: General:  Elderly and chronically ill appearing. BiPAP in place.  HEENT: Normal, poor personal grooming, Bipap in place.  Neck: supple. JVP difficulty to assess with BiPAP, but appears around 7-8 cm.. Carotids 2+ bilat; no bruits. No lymphadenopathy or thyromegaly appreciated. Cor: PMI nondisplaced. Regular.  No rubs, gallops or murmurs. Lungs: Diminished throughout with mild basilar crackles worse in Left base.  Abdomen: soft, NT, ND, no HSM. No bruits or masses. +BS  Extremities: no cyanosis, clubbing, rash. Trace to 1+ ankle edema Neuro: alert & orientedx3, cranial nerves grossly intact. moves all 4 extremities w/o difficulty. Affect flat  Telemetry: Pacing with underlying Afib 60s-70s  Labs: Basic Metabolic Panel:  Recent Labs Lab 03/02/16 1940 03/03/16 0609  NA 139 139  K 5.1 5.0  CL 105 105  CO2 27 28  GLUCOSE 148* 172*  BUN 16 19  CREATININE 1.13 1.30*  CALCIUM 9.2 9.2  MG  --  1.9    Liver Function Tests:  Recent Labs Lab 03/02/16 1940  AST 38  ALT 34  ALKPHOS 103  BILITOT 1.2  PROT 7.3  ALBUMIN 3.9    Recent Labs Lab 03/02/16 1940  LIPASE 41   No results for input(s): AMMONIA in the last  168 hours.  CBC:  Recent Labs Lab 03/02/16 1940 03/03/16 0609  WBC 14.9* 11.7*  NEUTROABS  --  10.9*  HGB 13.6 13.1  HCT 44.2 43.4  MCV 95.3 95.6  PLT 153 138*    Cardiac Enzymes:  Recent Labs Lab 03/03/16 0139 03/03/16 0609  TROPONINI 0.05* 0.03*    BNP: BNP (last 3 results)  Recent Labs  05/22/15 1645 05/22/15 2107 03/02/16 1940  BNP 604.2* 592.8* 361.9*    ProBNP (last 3 results) No results for input(s): PROBNP in the last 8760 hours.   CBG: No results for input(s): GLUCAP in the last 168 hours.  Coagulation Studies:  Recent Labs  03/02/16 1940  LABPROT 15.1  INR 1.19    Other results: EKG: Ectopic atrial rhythm in 60s  Imaging: Dg Chest Port 1 View  Result Date: 03/03/2016 CLINICAL DATA:  Shortness of breath EXAM: PORTABLE  CHEST 1 VIEW COMPARISON:  03/02/2016 FINDINGS: Chronic cardiomegaly. Direct and transvenous pacer/ ICD wires in unchanged position. Status post mitral valve replacement. Low volume chest with patchy bilateral opacity. Possible pleural fluid or thickening on the left. No visible pneumothorax. IMPRESSION: Unchanged bilateral lung opacity which could be infectious or congestive. Electronically Signed   By: Marnee Spring M.D.   On: 03/03/2016 07:23   Dg Chest Portable 1 View  Result Date: 03/02/2016 CLINICAL DATA:  Nausea and vomiting for 3 days. Cough. Wheezing. Hypoxia. EXAM: PORTABLE CHEST 1 VIEW COMPARISON:  11/22/2015 FINDINGS: Pacer/defibrillator leads appear stable. Prosthetic tricuspid valve. Moderate enlargement of the cardiopericardial silhouette with low lung volumes and bilateral interstitial and patchy airspace opacities. Mildly elevated right hemidiaphragm. Calcifications along the left hemidiaphragm. Old right rib deformities, healed. IMPRESSION: 1. Bilateral airspace opacities, left greater than right, especially in the perihilar region, accompanied by indistinct pulmonary vasculature and moderate enlargement of the  cardiopericardial silhouette, favoring acute pulmonary edema over multilobar pneumonia. 2. Elevated right hemidiaphragm. 3. Stable appearance of the pacer/defibrillator leads and prosthetic mitral valve. Electronically Signed   By: Gaylyn Rong M.D.   On: 03/02/2016 20:21      Medications:     Current Medications: . apixaban  5 mg Oral BID  . atorvastatin  20 mg Oral q1800  . azithromycin  500 mg Oral Daily  . carvedilol  6.25 mg Oral BID WC  . cefTRIAXone (ROCEPHIN)  IV  2 g Intravenous Q24H  . digoxin  0.125 mg Oral QHS  . fenofibrate  54 mg Oral Daily  . folic acid  1 mg Oral Daily  . furosemide  40 mg Intravenous Q12H  . insulin aspart  0-9 Units Subcutaneous TID WC  . ipratropium-albuterol  3 mL Nebulization QID  . isosorbide-hydrALAZINE  1 tablet Oral TID  . methylPREDNISolone (SOLU-MEDROL) injection  60 mg Intravenous Q8H  . multivitamin with minerals  1 tablet Oral Daily  . sodium chloride flush  3 mL Intravenous Q12H  . spironolactone  25 mg Oral QHS  . thiamine  100 mg Oral Daily   Or  . thiamine  100 mg Intravenous Daily     Infusions: . albuterol Stopped (03/02/16 2047)  . albuterol 5 mg/hr (03/02/16 2120)      Assessment/Plan   HALSTON KINTZ is a 70 y.o. male with PMH of chronic Afib s/p AV node ablation and BiV pacing, Mixed ICM/NIMC, Chronic ETOH abuse, MV replacement x 2 and CAD admitted for worsening SOB.   1. Acute on Chronic systolic CHF:  ECHO 05/23/15 EF 20-25% Mixed ischemic/nonischemic (ETOH) cardiomyopathy.  S/p Medtronic CRT-D.   - Repeat Echo pending.  - He remains volume overloaded on exam. Increase lasix to 80 mg IV BID.  - Continue spironolactone 25 mg daily - Continue Coreg 6.25 mg BID. - Continue digoxin 0.125. Check level in am.   -  Stop BiDIL and put back on Losartan at 50 mg daily. Pt will not comply with TID dosing.  - Pt has ongoing medical and dietary noncompliance.  - Will aim to keep Med regimen as simple as possible.    2. Acute respiratory failure on COPD with 02 desat - Per primary. Currently on BiPAP, Nebs, Steroids, and CAP coverage.  - Significant COPD by spirometry 10/2015 3. Atrial fibrillation: Chronic. He has had AV nodal ablation with BiV pacing.   - Continue Eliquis 5 mg bid. Reports non compliance to this at home (taking once daily). Explained risk of  stroke. Will switch to Xarelto (for once daily dosing) 4. CAD: Denies chest pain.  - LHC (3/16) with moderate distal LM stenosis (IVUS 6.9 mm2), 80% ostial LCx, total occlusion RCA with collaterals.  Ostial LCx not ideal for PCI, managed medically.  - No ASA given apixaban use, now back on statin.  5. Mitral valve replacement: Bioprosthetic valve currently, originally Bjork-Shiley. Valve stable on 7/16 echo.  - Repeat echo pending as above.  6. Smoking: - Continues to smoke > 1 ppd and refuses cessation  Length of Stay: 1  Graciella Freer PA-C 03/03/2016, 11:27 AM  Advanced Heart Failure Team Pager [phone] (M-F; 7a - 4p)  Please contact [facility] for night-coverage after hours (4p -7a ) and weekends on [email or web address]  Patient seen with PA, agree with the above note.  He has been off his cardiac meds for a number of days and appears volume overloaded on exam.  Significant wheezing also in the setting of known severe COPD.  Suspect mixed COPD exacerbation/CHF exacerbation.  - COPD treatment with steroids, nebs, antibiotics per primary service. Discussed smoking cessation.  - Needs diuresis.  Will start Lasix 80 mg IV bid.  Continue current Coreg, spironolactone and digoxin (check level).  Would not use Bidil, put back on losartan 50 mg bid.   - In terms of atrial fibrillation, he has not been compliant with Eliquis (takes once a day).  Would transition to Xarelto.  - He has known severe CAD without good interventional option.  No chest pain.  Continue statin.   Marca Ancona 03/03/2016 12:47 PM

## 2016-03-03 NOTE — Progress Notes (Signed)
ANTICOAGULATION CONSULT NOTE - Initial Consult  Pharmacy Consult for xarelto Indication: atrial fibrillation  Allergies  Allergen Reactions  . Warfarin Other (See Comments)    Non compliance and ETOH abuse    Patient Measurements: Height: 5\' 7"  (170.2 cm) Weight: 173 lb 1 oz (78.5 kg) IBW/kg (Calculated) : 66.1   Vital Signs: Temp: 97.5 F (36.4 C) (09/26 1216) Temp Source: Axillary (09/26 1216) BP: 140/75 (09/26 1216) Pulse Rate: 62 (09/26 1216)  Labs:  Recent Labs  03/02/16 1940 03/03/16 0139 03/03/16 0609 03/03/16 1116  HGB 13.6  --  13.1  --   HCT 44.2  --  43.4  --   PLT 153  --  138*  --   APTT 32  --   --   --   LABPROT 15.1  --   --   --   INR 1.19  --   --   --   CREATININE 1.13  --  1.30*  --   TROPONINI  --  0.05* 0.03* 0.03*    Estimated Creatinine Clearance: 50.8 mL/min (by C-G formula based on SCr of 1.3 mg/dL (H)).   Medical History: Past Medical History:  Diagnosis Date  . AICD (automatic cardioverter/defibrillator) present   . Alcohol abuse   . Anxiety   . Bipolar disorder (HCC)   . Biventricular automatic implantable cardioverter defibrillator in situ    a. 01/2014 s/p MDT [id number] Viva XT CRT-D (ser # [id number]).  . CAD (coronary artery disease)    a. s/p prior PCI/stenting of the LCX;  b. 08/2014 MV: EF 20%, large septal, apical, and inferior infarct from apex to base, no ischemia;  c. 08/2014 NSTEMI/Cath: LM mod distal dzs extending into ostial LCX (80%), LAD tortuous, RI nl, OM mod dzs, RCA 100 CTO with R->R and L->R collats-->Med Rx.  . Chronic atrial fibrillation (HCC)    a. CHA2DS2VASc = 6->eliquis;  b. S/P AVN RFCA an BiV ICD placement.  . Chronic respiratory failure (HCC)   . Chronic systolic congestive heart failure (HCC)    a. 01/2014 Echo: EF 25-30%, mod conc LVH, mod dil LA.  . CKD (chronic kidney disease)    a. suspect stage II-III based on historical labs.  . Complete heart block (HCC)    a. In setting of prior AV nodal  ablation r/t afib-->BiV ICD (01/2014).  . COPD (chronic obstructive pulmonary disease) (HCC)   . CVA (cerebral vascular accident) (HCC)    a. Multiple prior embolic strokes.  . Dementia   . Depression   . DVT (deep venous thrombosis) (HCC)   . Dyslipidemia   . Hyperglycemia   . Hypertension   . Mitral valve disease    a. remote mitral replacement with Bjork Shiley valve;  b. 06/2006 Redo MVR with tissue valve.  . Mixed Ischemic and Nonischemic Cardiomyopathy    a. 8/.2015 Echo: Ef 25-30%;  b. 01/2014 s/p MDT [id number] Talbot GrumblingViva XT CRT-D (ser # [id number]).  . On home oxygen therapy    "3L at night" (11/23/2015)  . Ventral hernia     Medications:  Prescriptions Prior to Admission  Medication Sig Dispense Refill Last Dose  . albuterol (PROVENTIL HFA;VENTOLIN HFA) 108 (90 Base) MCG/ACT inhaler Inhale 2 puffs into the lungs every 4 (four) hours as needed for wheezing or shortness of breath.   unknown  . apixaban (ELIQUIS) 5 MG TABS tablet Take 1 tablet (5 mg total) by mouth 2 (two) times daily. (Patient taking differently: Take 5 mg by mouth  every morning. ) 180 tablet 3 Past Week at Unknown time  . atorvastatin (LIPITOR) 20 MG tablet Take 1 tablet (20 mg total) by mouth daily. 90 tablet 3 Past Week at Unknown time  . carvedilol (COREG) 6.25 MG tablet Take 1 tablet (6.25 mg total) by mouth 2 (two) times daily with a meal. (Patient taking differently: Take 12.5 mg by mouth at bedtime. ) 180 tablet 3 03/01/2016 at 2100  . digoxin (LANOXIN) 0.125 MG tablet Take 1 tablet (0.125 mg total) by mouth daily. (Patient taking differently: Take 0.125 mg by mouth at bedtime. ) 90 tablet 3 03/01/2016 at Unknown time  . fenofibrate (TRICOR) 48 MG tablet Take 1 tablet (48 mg total) by mouth daily. Reported on 05/28/2015 90 tablet 3 03/01/2016 at Unknown time  . furosemide (LASIX) 20 MG tablet Take 2 tablets (40 mg total) by mouth 2 (two) times daily. 120 tablet 6 03/01/2016 at Unknown time  . loratadine (CLARITIN) 10 MG  tablet Take 10 mg by mouth daily as needed for allergies.   unknown  . losartan (COZAAR) 50 MG tablet Take 1 tablet (50 mg total) by mouth daily. (Patient taking differently: Take 50 mg by mouth at bedtime. )   03/01/2016 at Unknown time  . Multiple Vitamin (MULTIVITAMIN WITH MINERALS) TABS tablet Take 1 tablet by mouth daily. 30 tablet 2 Past Week at Unknown time  . phenylephrine (SUDAFED PE) 10 MG TABS tablet Take 10 mg by mouth every 6 (six) hours as needed (for cold symptoms).   03/01/2016 at Unknown time  . ranitidine (ZANTAC) 150 MG tablet Take 150 mg by mouth daily as needed for heartburn.   unknown  . spironolactone (ALDACTONE) 25 MG tablet Take 1 tablet (25 mg total) by mouth daily. (Patient taking differently: Take 25 mg by mouth at bedtime. ) 90 tablet 3 03/01/2016 at Unknown time  . thiamine 100 MG tablet Take 1 tablet (100 mg total) by mouth daily. 30 tablet 0 Past Week at Unknown time  . isosorbide mononitrate (IMDUR) 30 MG 24 hr tablet Take 1 tablet (30 mg total) by mouth daily. (Patient not taking: Reported on 03/02/2016) 30 tablet 6 Not Taking at Unknown time  . nitroGLYCERIN (NITROSTAT) 0.4 MG SL tablet Place 1 tablet (0.4 mg total) under the tongue every 5 (five) minutes as needed for chest pain. 25 tablet 2 Never   Scheduled:  . atorvastatin  20 mg Oral q1800  . azithromycin  500 mg Oral Daily  . carvedilol  6.25 mg Oral BID WC  . cefTRIAXone (ROCEPHIN)  IV  2 g Intravenous Q24H  . digoxin  0.125 mg Oral QHS  . fenofibrate  54 mg Oral Daily  . folic acid  1 mg Oral Daily  . furosemide  40 mg Intravenous Once  . furosemide  80 mg Intravenous Q12H  . insulin aspart  0-9 Units Subcutaneous TID WC  . ipratropium-albuterol  3 mL Nebulization QID  . losartan  50 mg Oral Daily  . methylPREDNISolone (SOLU-MEDROL) injection  60 mg Intravenous Q8H  . multivitamin with minerals  1 tablet Oral Daily  . sodium chloride flush  3 mL Intravenous Q12H  . spironolactone  25 mg Oral QHS  .  thiamine  100 mg Oral Daily   Or  . thiamine  100 mg Intravenous Daily    Assessment: 70 yo male with afib on apixiban at home and with medication compliance concerns. Pharmacy consulted to change to Xarelto to ease daily pill burden -last  apixiban dose was about 1am on 9/26 -SCr= 1.3, CrCL ~ 60 (using TBW)  Goal of Therapy:  Monitor platelets by anticoagulation protocol: Yes   Plan:  -Xarelto 20mg  po daily (first dose today with dinner) -Will follow patient progress  Harland German, Pharm D 03/03/2016 1:36 PM

## 2016-03-03 NOTE — Progress Notes (Signed)
Bipap alarming in patients room upon entering patients room patient is sitting eating subway with family member at bedside and bipap on floor.  Applied [place] to patient and instructed patient and family member to the significant risks of removing bipap on there own.  Notified respiratory of removal of bipap.  Updated patients nurse Mardene Celeste.  Will continue to monitor.

## 2016-03-04 ENCOUNTER — Encounter (HOSPITAL_COMMUNITY): Payer: Self-pay | Admitting: *Deleted

## 2016-03-04 LAB — BLOOD GAS, ARTERIAL
ACID-BASE EXCESS: 6.3 mmol/L — AB (ref 0.0–2.0)
Bicarbonate: 33.2 mmol/L — ABNORMAL HIGH (ref 20.0–28.0)
Drawn by: 39898
O2 Content: 3 L/min
O2 Saturation: 96.4 %
PCO2 ART: 78.3 mmHg — AB (ref 32.0–48.0)
PH ART: 7.251 — AB (ref 7.350–7.450)
Patient temperature: 98.6
pO2, Arterial: 96 mmHg (ref 83.0–108.0)

## 2016-03-04 LAB — GLUCOSE, CAPILLARY
GLUCOSE-CAPILLARY: 161 mg/dL — AB (ref 65–99)
Glucose-Capillary: 185 mg/dL — ABNORMAL HIGH (ref 65–99)
Glucose-Capillary: 143 mg/dL — ABNORMAL HIGH (ref 65–99)
Glucose-Capillary: 185 mg/dL — ABNORMAL HIGH (ref 65–99)

## 2016-03-04 LAB — BASIC METABOLIC PANEL
Anion gap: 11 (ref 5–15)
BUN: 31 mg/dL — AB (ref 6–20)
CALCIUM: 9.3 mg/dL (ref 8.9–10.3)
CHLORIDE: 97 mmol/L — AB (ref 101–111)
CO2: 30 mmol/L (ref 22–32)
CREATININE: 1.35 mg/dL — AB (ref 0.61–1.24)
GFR calc Af Amer: >60 mL/min (ref >60)
GFR calc non Af Amer: 52 mL/min — ABNORMAL LOW (ref >60)
GLUCOSE: 169 mg/dL — AB (ref 65–99)
Potassium: 5 mmol/L (ref 3.5–5.1)
Sodium: 138 mmol/L (ref 135–145)

## 2016-03-04 LAB — CBC
HCT: 42.3 % (ref 39.0–52.0)
Hemoglobin: 12.7 g/dL — ABNORMAL LOW (ref 13.0–17.0)
MCH: 28.9 pg (ref 26.0–34.0)
MCHC: 30 g/dL (ref 30.0–36.0)
MCV: 96.4 fL (ref 78.0–100.0)
PLATELETS: 162 10*3/uL (ref 150–400)
RBC: 4.39 MIL/uL (ref 4.22–5.81)
RDW: 14.8 % (ref 11.5–15.5)
WBC: 14.6 10*3/uL — ABNORMAL HIGH (ref 4.0–10.5)

## 2016-03-04 MED ORDER — METHYLPREDNISOLONE SODIUM SUCC 125 MG IJ SOLR
60.0000 mg | Freq: Two times a day (BID) | INTRAMUSCULAR | Status: DC
  Administered 2016-03-05 (×2): 60 mg via INTRAVENOUS
  Filled 2016-03-04 (×2): qty 2

## 2016-03-04 NOTE — Progress Notes (Signed)
SLP Cancellation Note  Patient Details Name: [name] MRN: [id number] DOB: [birth year]   Cancelled treatment:       Reason Eval/Treat Not Completed: Patient not medically ready, remains on BiPAP. Will f/u as able.   Maxcine Ham 03/04/2016, 2:37 PM  Maxcine Ham, M.A. CCC-SLP [phone]

## 2016-03-04 NOTE — Evaluation (Signed)
Occupational Therapy Evaluation Patient Details Name: [name] MRN: [id number] DOB: 14-Dec-1945 Today's Date: 03/04/2016    History of Present Illness 70 y.o. male with medical history significant of COPD, chronic respiratory failure on home oxygen, systolic CHF last EF 20-25% in 05/2015, mixed cardiomyopathy, A. fib on Eliquis, CAD s/p PCI, s/p  MV replacement, CVA, PVD, HLD; presents with complaints of shortness of breath.   Clinical Impression   Pt reports he was independent with ADL PTA. Currently pt overall min guard for basic transfers and ADL. Functional mobility limited due to pt currently on bipap. VSS throughout session. Pt reports his son can provide 24/7 supervision upon d/c home. Pt would benefit from continued skilled OT to address established goals.    Follow Up Recommendations  No OT follow up;Supervision/Assistance - 24 hour    Equipment Recommendations  Tub/shower seat    Recommendations for Other Services PT consult     Precautions / Restrictions Precautions Precautions: Fall Precaution Comments: Currently on bipap, denies daytime use of O2. "Only at night". Restrictions Weight Bearing Restrictions: No      Mobility Bed Mobility Overal bed mobility: Needs Assistance Bed Mobility: Supine to Sit     Supine to sit: Supervision     General bed mobility comments: Supervision for safety; no physical assist required. HOB flat without use of bed rail.  Transfers Overall transfer level: Needs assistance Equipment used: None Transfers: Sit to/from UGI CorporationStand;Stand Pivot Transfers Sit to Stand: Min guard Stand pivot transfers: Min guard       General transfer comment: Min guard for safety. No physical assist required; min guard for lines and tubes. No unsteadiness or LOB noted with stand pivot.     Balance Overall balance assessment: Needs assistance Sitting-balance support: Feet supported;No upper extremity supported Sitting balance-Leahy Scale:  Normal     Standing balance support: No upper extremity supported Standing balance-Leahy Scale: Good                              ADL Overall ADL's : Needs assistance/impaired     Grooming: Set up;Sitting;Supervision/safety   Upper Body Bathing: Set up;Supervision/ safety;Sitting   Lower Body Bathing: Min guard;Sit to/from stand   Upper Body Dressing : Set up;Supervision/safety;Sitting   Lower Body Dressing: Min guard;Sit to/from stand   Toilet Transfer: Min Barrister's clerkguard;Stand-pivot;BSC Toilet Transfer Details (indicate cue type and reason): Pt currenlty limited by bipap; anticipated pt would be able to perform ambulation to bathroom but currently only able to perform stand pivot. Toilet transfer simulated by stand pivot from Owens-IllinoisEOB>chair. Toileting- ArchitectClothing Manipulation and Hygiene: Min guard;Sit to/from stand       Functional mobility during ADLs: Min guard (for stand pivot only) General ADL Comments: Functional mobility limited by pt currently on bipap. VSS throughout session.      Vision Additional Comments: Appears WFL.   Perception     Praxis      Pertinent Vitals/Pain Pain Assessment: No/denies pain     Hand Dominance Right   Extremity/Trunk Assessment Upper Extremity Assessment Upper Extremity Assessment: Overall WFL for tasks assessed   Lower Extremity Assessment Lower Extremity Assessment: Defer to PT evaluation   Cervical / Trunk Assessment Cervical / Trunk Assessment: Normal   Communication Communication Communication: HOH (currently on bipap)   Cognition Arousal/Alertness: Awake/alert Behavior During Therapy: WFL for tasks assessed/performed Overall Cognitive Status: Within Functional Limits for tasks assessed  General Comments       Exercises       Shoulder Instructions      Home Living Family/patient expects to be discharged to:: Private residence Living Arrangements: Children Available Help at  Discharge: Family;Available 24 hours/day Type of Home: House Home Access: Level entry     Home Layout: One level     Bathroom Shower/Tub: Tub/shower unit;Curtain Shower/tub characteristics: Curtain Firefighter: Handicapped height     Home Equipment: Environmental consultant - 2 wheels;Cane - single point;Hand held shower head   Additional Comments: Unsure of accuracy of pt report regarding family support at discharge.      Prior Functioning/Environment Level of Independence: Independent                 OT Problem List: Impaired balance (sitting and/or standing);Decreased safety awareness;Decreased knowledge of use of DME or AE;Decreased knowledge of precautions;Cardiopulmonary status limiting activity   OT Treatment/Interventions: Self-care/ADL training;Energy conservation;DME and/or AE instruction;Therapeutic activities;Patient/family education    OT Goals(Current goals can be found in the care plan section) Acute Rehab OT Goals Patient Stated Goal: get OOB OT Goal Formulation: With patient Time For Goal Achievement: 03/18/16 Potential to Achieve Goals: Good ADL Goals Pt Will Perform Grooming: standing;with modified independence Pt Will Transfer to Toilet: with modified independence;ambulating;regular height toilet Pt Will Perform Tub/Shower Transfer: Tub transfer;with supervision;ambulating;shower seat Additional ADL Goal #1: Pt will independently verbally recall 3 energy conservation strategies and implement with ADL.  OT Frequency: Min 2X/week   Barriers to D/C:            Co-evaluation              End of Session Equipment Utilized During Treatment: Other (comment) (bipap) Nurse Communication: Mobility status;Other (comment) (no chair alarm on pt)  Activity Tolerance: Patient tolerated treatment well Patient left: in chair;with call bell/phone within reach   Time: 1116-1131 OT Time Calculation (min): 15 min Charges:  OT General Charges $OT Visit: 1  Procedure OT Evaluation $OT Eval Moderate Complexity: 1 Procedure G-Codes:     Gaye Alken M.S., OTR/L Pager: [phone]  03/04/2016, 11:46 AM

## 2016-03-04 NOTE — Evaluation (Signed)
Physical Therapy Evaluation Patient Details Name: [name] Groft MRN: [id number] DOB: [birth year] Today's Date: 03/04/2016   History of Present Illness  70 y.o. male with  medical history significant of COPD, chronic respiratory failure on home oxygen, systolic CHF last EF 20-25% in 05/2015, mixed cardiomyopathy, A. fib on Eliquis, CAD s/p PCI, s/p  MV replacement, CVA, PVD, HLD; presents with complaints of shortness of breath.  Dx with acute on chronic hypoic/hypercapnic respiratory failure with respiratory acidosis, acute on chronic systolic CHF, mild ARF, and mild elevation in troponin (likely demand ischemia).      Clinical Impression  Pt was able to walk the entire unit holding the O2 cart on 3 L O2 [place] with only one bought of dry coughing.  O2 sats remained 96% on 3 L O2 [place].  Pt felt he could breathe better while upright and moving.  PT will follow acutely, but he will likely not need home therapy follow up at this time as he is mobilizing well.  As he stabilizes, O2 sat qualification will need to be assessed for d/c. PT to follow acutely until d/c confirmed.       Follow Up Recommendations No PT follow up;Supervision - Intermittent    Equipment Recommendations  None recommended by PT    Recommendations for Other Services   NA    Precautions / Restrictions Precautions Precautions: Other (comment) Precaution Comments: monitor O2 sats with activity      Mobility  Bed Mobility Overal bed mobility: Needs Assistance Bed Mobility: Supine to Sit     Supine to sit: Supervision     General bed mobility comments: supervision for safety.   Transfers Overall transfer level: Needs assistance Equipment used: None Transfers: Sit to/from Stand Sit to Stand: Supervision         General transfer comment: supervision for safety  Ambulation/Gait Ambulation/Gait assistance: Supervision Ambulation Distance (Feet): 300 Feet Assistive device:  (pushing O2 cart with both hands) Gait  Pattern/deviations: WFL(Within Functional Limits)                           Balance Overall balance assessment: Needs assistance Sitting-balance support: Feet supported;No upper extremity supported Sitting balance-Leahy Scale: Normal     Standing balance support: Bilateral upper extremity supported Standing balance-Leahy Scale: Good                               Pertinent Vitals/Pain Pain Assessment: No/denies pain    Home Living Family/patient expects to be discharged to:: Private residence Living Arrangements: Children Available Help at Discharge: Family;Available 24 hours/day Type of Home: House Home Access: Level entry     Home Layout: One level Home Equipment: Walker - 2 wheels;Cane - single point;Hand held shower head      Prior Function Level of Independence: Independent         Comments: no home O2 use except at night at baseline.      Hand Dominance   Dominant Hand: Right    Extremity/Trunk Assessment   Upper Extremity Assessment: Defer to OT evaluation           Lower Extremity Assessment: Overall WFL for tasks assessed      Cervical / Trunk Assessment: Normal  Communication   Communication: HOH  Cognition Arousal/Alertness: Awake/alert Behavior During Therapy: WFL for tasks assessed/performed Overall Cognitive Status: No family/caregiver present to determine baseline cognitive functioning  General Comments General comments (skin integrity, edema, etc.): Pt ambulated with 3 L O2 [place].  Some boughts of dry coughing, but O2 sats remained 96% on RA.  HR stable.         Assessment/Plan    PT Assessment Patient needs continued PT services  PT Problem List Decreased activity tolerance;Decreased mobility;Cardiopulmonary status limiting activity          PT Treatment Interventions DME instruction;Gait training;Stair training;Functional mobility training;Therapeutic activities;Balance  training;Therapeutic exercise;Patient/family education    PT Goals (Current goals can be found in the Care Plan section)  Acute Rehab PT Goals Patient Stated Goal: to go home PT Goal Formulation: With patient Time For Goal Achievement: 03/18/16 Potential to Achieve Goals: Good    Frequency Min 3X/week           End of Session Equipment Utilized During Treatment: Oxygen Activity Tolerance: Patient tolerated treatment well Patient left: in chair Nurse Communication: Mobility status         Time: 1165-7903 PT Time Calculation (min) (ACUTE ONLY): 15 min   Charges:   PT Evaluation $PT Eval Moderate Complexity: 1 Procedure          Zyere Jiminez B. Nicol Herbig, PT, DPT [phone]   03/04/2016, 8:40 PM

## 2016-03-04 NOTE — Progress Notes (Signed)
[place] TEAM 1 - Stepdown/ICU TEAM  Sheppard CoilHarry W Warman  ZOX:[id number] DOB: [birth year] DOA: 03/02/2016 PCP: Cain SaupeFULP, CAMMIE, MD    Brief Narrative:  70 y.o.malewith history of COPD, chronic respiratory failure on home oxygen, systolic CHF(EF 20-25% in 05/2015),mixed cardiomyopathy, A. fib on Eliquis,CAD s/p PCI, s/p MVreplacement, CVA, PVD, and HLD who presented with shortness of breath. Patient reported he had stopped taking some of his medications for 2 days because they "made him urinate too much."  Subjective: The patient is on BiPAP at time of visit but is alert and oriented.  He does not like the BiPAP.  He denies chest pain shortness breath fevers chills nausea or vomiting.  He states that overall he is feeling much better.  Assessment & Plan:  Acute on chronic hypoxic/hypercapnic respiratory failure with respiratory acidosis due to combination of acute on chronic systolic CHF and COPD exacerbation  Acute COPD exac - ?Pneumonia Doubt true PNA, but abx do have a role in acute bronchospastic COPD exac so will plan to complete a 5 day tx course   Acute on chronic systolic CHF EF 20-25% - noncompliant with medications and diet - CHF Team following and dosing IV diuretic     Filed Weights   03/02/16 2327 03/03/16 0500 03/04/16 0831  Weight: 78.4 kg (172 lb 13.5 oz) 78.5 kg (173 lb 1 oz) 78.1 kg (172 lb 2.9 oz)    Mild ARF Follow w/ ongoing diuretic use   Recent Labs Lab 03/02/16 1940 03/03/16 0609 03/04/16 0450  CREATININE 1.13 1.30* 1.35*    Mild elevation in troponin Chest pain-free - likely due to demand ischemia - f/u TTE to assess for WMA pending   HX of alcohol abuse Counseled to quit - cont CIWA protocol.  Dyslipidemia on statin and fenofibrate  Chronic atrial fibrillation CHA2DS2 - VASc at least 4 - on coreg, digoxin and Xarelto - hx AV node ablation w/ BiV pacing  S/P mitral valve replacement  Bioprosthetic valve   DVT prophylaxis: Xarelto    Code Status: FULL CODE Family Communication: no family present at time of exam  Disposition Plan: SDU  Consultants:  CHF Team   Procedures: TTE - pending   Antimicrobials:  Azithromycin 9/25 > Ceftriaxone 9/26 >  Objective: Blood pressure 124/67, pulse 61, temperature 97.5 F (36.4 C), temperature source Oral, resp. rate 19, height 5\' 7"  (1.702 m), weight 78.1 kg (172 lb 2.9 oz), SpO2 97 %.  Intake/Output Summary (Last 24 hours) at 03/04/16 1443 Last data filed at 03/04/16 1232  Gross per 24 hour  Intake           525.33 ml  Output             1775 ml  Net         -1249.67 ml   Filed Weights   03/02/16 2327 03/03/16 0500 03/04/16 0831  Weight: 78.4 kg (172 lb 13.5 oz) 78.5 kg (173 lb 1 oz) 78.1 kg (172 lb 2.9 oz)    Examination: General: Tolerating BiPAP with no acute distress at present Lungs: Clear to auscultation bilaterally without wheezes or crackles Cardiovascular: Regular rate without murmur gallop or rub normal S1 and S2 Abdomen: Nontender, nondistended, soft, bowel sounds positive, no rebound, no ascites, no appreciable mass Extremities: No significant cyanosis, or clubbing - 1+ edema bilateral lower extremities  CBC:  Recent Labs Lab 03/02/16 1940 03/03/16 0609 03/04/16 0450  WBC 14.9* 11.7* 14.6*  NEUTROABS  --  10.9*  --  HGB 13.6 13.1 12.7*  HCT 44.2 43.4 42.3  MCV 95.3 95.6 96.4  PLT 153 138* 162   Basic Metabolic Panel:  Recent Labs Lab 03/02/16 1940 03/03/16 0609 03/04/16 0450  NA 139 139 138  K 5.1 5.0 5.0  CL 105 105 97*  CO2 27 28 30   GLUCOSE 148* 172* 169*  BUN 16 19 31*  CREATININE 1.13 1.30* 1.35*  CALCIUM 9.2 9.2 9.3  MG  --  1.9  --    GFR: Estimated Creatinine Clearance: 49 mL/min (by C-G formula based on SCr of 1.35 mg/dL (H)).  Liver Function Tests:  Recent Labs Lab 03/02/16 1940  AST 38  ALT 34  ALKPHOS 103  BILITOT 1.2  PROT 7.3  ALBUMIN 3.9    Recent Labs Lab 03/02/16 1940  LIPASE 41    Coagulation Profile:  Recent Labs Lab 03/02/16 1940  INR 1.19    Cardiac Enzymes:  Recent Labs Lab 03/03/16 0139 03/03/16 0609 03/03/16 1116  TROPONINI 0.05* 0.03* 0.03*    HbA1C: Hgb A1c MFr Bld  Date/Time Value Ref Range Status  02/24/2008 09:01 PM   Final   5.8 (NOTE)   The ADA recommends the following therapeutic goal for glycemic   control related to Hgb A1C measurement:   Goal of Therapy:   < 7.0% Hgb A1C   Reference: American Diabetes Association: Clinical Practice   Recommendations 2008, Diabetes Care,  2008, 31:(Suppl 1).  02/24/2008 05:55 AM   Final   5.7 (NOTE)   The ADA recommends the following therapeutic goal for glycemic   control related to Hgb A1C measurement:   Goal of Therapy:   < 7.0% Hgb A1C   Reference: American Diabetes Association: Clinical Practice   Recommendations 2008, Diabetes Care,  2008, 31:(Suppl 1).    CBG:  Recent Labs Lab 03/03/16 1513 03/03/16 2110 03/04/16 0755 03/04/16 1123  GLUCAP 254* 209* 185* 161*    Recent Results (from the past 240 hour(s))  MRSA PCR Screening     Status: None   Collection Time: 03/03/16  2:34 AM  Result Value Ref Range Status   MRSA by PCR NEGATIVE NEGATIVE Final    Comment:        The GeneXpert MRSA Assay (FDA approved for NASAL specimens only), is one component of a comprehensive MRSA colonization surveillance program. It is not intended to diagnose MRSA infection nor to guide or monitor treatment for MRSA infections.      Scheduled Meds: . atorvastatin  20 mg Oral q1800  . azithromycin  500 mg Oral Daily  . carvedilol  6.25 mg Oral BID WC  . cefTRIAXone (ROCEPHIN)  IV  2 g Intravenous Q24H  . digoxin  0.125 mg Oral QHS  . fenofibrate  54 mg Oral Daily  . folic acid  1 mg Oral Daily  . furosemide  80 mg Intravenous Q12H  . Influenza vac split quadrivalent PF  0.5 mL Intramuscular Tomorrow-1000  . insulin aspart  0-9 Units Subcutaneous TID WC  . ipratropium-albuterol  3 mL  Nebulization QID  . losartan  50 mg Oral Daily  . methylPREDNISolone (SOLU-MEDROL) injection  60 mg Intravenous Q8H  . multivitamin with minerals  1 tablet Oral Daily  . pneumococcal 23 valent vaccine  0.5 mL Intramuscular Tomorrow-1000  . rivaroxaban  20 mg Oral Q supper  . sodium chloride flush  3 mL Intravenous Q12H  . spironolactone  25 mg Oral QHS  . thiamine  100 mg Oral Daily  Or  . thiamine  100 mg Intravenous Daily   Continuous Infusions: . albuterol Stopped (03/02/16 2047)  . albuterol 5 mg/hr (03/02/16 2120)     LOS: 2 days   Lonia Blood, MD [name] Office  [phone] Pager - Text Page per Amion as per below:  On-Call/Text Page:      [email or web address]      password [email or web address]  If 7PM-7AM, please contact night-coverage [email or web address] Password [facility] 03/04/2016, 2:43 PM

## 2016-03-04 NOTE — Progress Notes (Signed)
Advanced Heart Failure Rounding Note  Referring Physician: Dr. Thedore Mins Primary Physician: Cain Saupe Primary Cardiologist:  Dr. Shirlee Latch   Reason for Consultation: A/C systolic HF  Subjective:    Yesterday afternoon pt family remember removed BiPAP so pt could eat subway and drink a dr pepper.  Had coughing episode after.  CXR without concern for aspiration. WBC up slightly to 14.6 from 11.  On BiPAP currently. Breathing improved.   Out 1.2 L and down 1 lb. Creatinine relatively stable. K mildly elevated chronically.   Objective:   Weight Range: 172 lb 2.9 oz (78.1 kg) Body mass index is 26.97 kg/m.   Vital Signs:   Temp:  [96.9 F (36.1 C)-98.2 F (36.8 C)] 96.9 F (36.1 C) (09/27 0730) Pulse Rate:  [59-72] 72 (09/27 0800) Resp:  [16-21] 20 (09/27 0800) BP: (120-140)/(56-76) 135/76 (09/27 0800) SpO2:  [94 %-98 %] 95 % (09/27 0800) FiO2 (%):  [3 %-40 %] 40 % (09/27 0758) Weight:  [172 lb 2.9 oz (78.1 kg)] 172 lb 2.9 oz (78.1 kg) (09/27 0831) Last BM Date: 03/04/16  Weight change: Filed Weights   03/02/16 2327 03/03/16 0500 03/04/16 0831  Weight: 172 lb 13.5 oz (78.4 kg) 173 lb 1 oz (78.5 kg) 172 lb 2.9 oz (78.1 kg)    Intake/Output:   Intake/Output Summary (Last 24 hours) at 03/04/16 1054 Last data filed at 03/04/16 0700  Gross per 24 hour  Intake              420 ml  Output             1075 ml  Net             -655 ml     Physical Exam: General:  Elderly and chronically ill appearing. BiPAP in place.  HEENT: Normal, poor personal grooming, Bipap in place.  Neck: supple. JVP difficulty to assess, appears elevated. Carotids 2+ bilat; no bruits. No thyromegaly or nodule noted.  Cor: PMI nondisplaced. Regular.  No M/G/R Lungs: Diminished throughout with scattered wheezes Abdomen: soft, NT, ND, no HSM. No bruits or masses. +BS  Extremities: no cyanosis, clubbing, rash. Trace to 1+ ankle edema.  Neuro: alert & orientedx3, cranial nerves grossly intact. moves  all 4 extremities w/o difficulty. Affect flat  Telemetry: Reviewed personally, Pacing with underlying Afib - Rate controlled.  Labs: CBC  Recent Labs  03/03/16 0609 03/04/16 0450  WBC 11.7* 14.6*  NEUTROABS 10.9*  --   HGB 13.1 12.7*  HCT 43.4 42.3  MCV 95.6 96.4  PLT 138* 162   Basic Metabolic Panel  Recent Labs  03/03/16 0609 03/04/16 0450  NA 139 138  K 5.0 5.0  CL 105 97*  CO2 28 30  GLUCOSE 172* 169*  BUN 19 31*  CREATININE 1.30* 1.35*  CALCIUM 9.2 9.3  MG 1.9  --    Liver Function Tests  Recent Labs  03/02/16 1940  AST 38  ALT 34  ALKPHOS 103  BILITOT 1.2  PROT 7.3  ALBUMIN 3.9    Recent Labs  03/02/16 1940  LIPASE 41   Cardiac Enzymes  Recent Labs  03/03/16 0139 03/03/16 0609 03/03/16 1116  TROPONINI 0.05* 0.03* 0.03*    BNP: BNP (last 3 results)  Recent Labs  05/22/15 1645 05/22/15 2107 03/02/16 1940  BNP 604.2* 592.8* 361.9*    ProBNP (last 3 results) No results for input(s): PROBNP in the last 8760 hours.   D-Dimer No results for input(s): DDIMER in  the last 72 hours. Hemoglobin A1C No results for input(s): HGBA1C in the last 72 hours. Fasting Lipid Panel No results for input(s): CHOL, HDL, LDLCALC, TRIG, CHOLHDL, LDLDIRECT in the last 72 hours. Thyroid Function Tests No results for input(s): TSH, T4TOTAL, T3FREE, THYROIDAB in the last 72 hours.  Invalid input(s): FREET3  Other results:     Imaging/Studies:  Dg Chest Port 1 View  Result Date: 03/03/2016 CLINICAL DATA:  Restlessness. Coronary artery disease. Cardiac arrhythmia. EXAM: PORTABLE CHEST 1 VIEW COMPARISON:  March 03, 2016 study obtained earlier in the day FINDINGS: There remains underlying interstitial edema. There has been partial but incomplete clearing of airspace opacity from the left lower lobe. No new opacity evident. There is cardiomegaly with pulmonary venous hypertension. Pacemaker leads are attached to the right atrium, right ventricle,  and left ventricle. Patient is status post mitral valve replacement. No adenopathy. There old healed rib fractures on the right. IMPRESSION: Compared to earlier in the day, there is partial clearing of airspace opacity from the left base, likely partial clearing of alveolar edema. There is underlying interstitial edema elsewhere which is stable. No new opacity. Stable cardiac silhouette. No pneumothorax. Old rib fractures on the right noted. Electronically Signed   By: Bretta Bang III M.D.   On: 03/03/2016 14:07   Dg Chest Port 1 View  Result Date: 03/03/2016 CLINICAL DATA:  Shortness of breath EXAM: PORTABLE CHEST 1 VIEW COMPARISON:  03/02/2016 FINDINGS: Chronic cardiomegaly. Direct and transvenous pacer/ ICD wires in unchanged position. Status post mitral valve replacement. Low volume chest with patchy bilateral opacity. Possible pleural fluid or thickening on the left. No visible pneumothorax. IMPRESSION: Unchanged bilateral lung opacity which could be infectious or congestive. Electronically Signed   By: Marnee Spring M.D.   On: 03/03/2016 07:23   Dg Chest Portable 1 View  Result Date: 03/02/2016 CLINICAL DATA:  Nausea and vomiting for 3 days. Cough. Wheezing. Hypoxia. EXAM: PORTABLE CHEST 1 VIEW COMPARISON:  11/22/2015 FINDINGS: Pacer/defibrillator leads appear stable. Prosthetic tricuspid valve. Moderate enlargement of the cardiopericardial silhouette with low lung volumes and bilateral interstitial and patchy airspace opacities. Mildly elevated right hemidiaphragm. Calcifications along the left hemidiaphragm. Old right rib deformities, healed. IMPRESSION: 1. Bilateral airspace opacities, left greater than right, especially in the perihilar region, accompanied by indistinct pulmonary vasculature and moderate enlargement of the cardiopericardial silhouette, favoring acute pulmonary edema over multilobar pneumonia. 2. Elevated right hemidiaphragm. 3. Stable appearance of the pacer/defibrillator  leads and prosthetic mitral valve. Electronically Signed   By: Gaylyn Rong M.D.   On: 03/02/2016 20:21     Latest Echo  Latest Cath   Medications:     Scheduled Medications: . atorvastatin  20 mg Oral q1800  . azithromycin  500 mg Oral Daily  . carvedilol  6.25 mg Oral BID WC  . cefTRIAXone (ROCEPHIN)  IV  2 g Intravenous Q24H  . digoxin  0.125 mg Oral QHS  . fenofibrate  54 mg Oral Daily  . folic acid  1 mg Oral Daily  . furosemide  80 mg Intravenous Q12H  . Influenza vac split quadrivalent PF  0.5 mL Intramuscular Tomorrow-1000  . insulin aspart  0-9 Units Subcutaneous TID WC  . ipratropium-albuterol  3 mL Nebulization QID  . losartan  50 mg Oral Daily  . methylPREDNISolone (SOLU-MEDROL) injection  60 mg Intravenous Q8H  . multivitamin with minerals  1 tablet Oral Daily  . pneumococcal 23 valent vaccine  0.5 mL Intramuscular Tomorrow-1000  . rivaroxaban  20 mg Oral Q supper  . sodium chloride flush  3 mL Intravenous Q12H  . spironolactone  25 mg Oral QHS  . thiamine  100 mg Oral Daily   Or  . thiamine  100 mg Intravenous Daily     Infusions: . albuterol Stopped (03/02/16 2047)  . albuterol 5 mg/hr (03/02/16 2120)     PRN Medications:  sodium chloride, acetaminophen, ipratropium-albuterol, LORazepam **OR** LORazepam, nitroGLYCERIN, ondansetron (ZOFRAN) IV, sodium chloride flush   Assessment/Plan   1. Acute on Chronic systolic CHF: ECHO 05/23/15 EF 20-25% Mixed ischemic/nonischemic (ETOH) cardiomyopathy. S/p Medtronic CRT-D.  - Repeat Echo pending.  - He remains volume overloaded with OK diuresis overnight. Creatinine stable BUN up slightly.  Likely 1-2 more days of lasix 80 mg IV BID.  - Continue spironolactone 25 mg daily - Continue Coreg 6.25 mg BID. - Continue digoxin 0.125. Had dose today. Check level in am.   - Continue Losartan 50 mg daily and spironolactone 25 daily.  - Pt has ongoing medical and dietary noncompliance.  - Will aim to keep  Med regimen as simple as possible.  2. Acute respiratory failure with severe COPD with 02 desat - Per primary. Currently on BiPAP, Nebs, Steroids, and CAP coverage.  - WBC up again today to 14. - Significant COPD by spirometry 10/2015 3. Atrial fibrillation:Chronic. He has had AV nodal ablation with BiV pacing.  - With non-compliance to BID dosing, switched from Eliquis to Xarelto 20 mg. 4. CAD: Denies chest pain.  - LHC (3/16) with moderate distal LM stenosis (IVUS 6.9 mm2), 80% ostial LCx, total occlusion RCA with collaterals. Ostial LCx not ideal for PCI, managed medically.  - No ASA given apixaban use, now back on statin.  5. Mitral valve replacement:Bioprosthetic valve currently, originally Bjork-Shiley. Valve stable on 7/16 echo.  - Repeat echo pending.   6. Smoking: - Continues to smoke > 1 ppd. Has continually refused cessation.   Continue to diurese.  Respiratory issues per primary.    Length of Stay: 2  Graciella FreerMichael Andrew Tillery PA-C 03/04/2016, 10:54 AM  Advanced Heart Failure Team Pager [phone] (M-F; 7a - 4p)  Please contact [facility] for night-coverage after hours (4p -7a ) and weekends on [email or web address]   Patient seen with PA, agree with the above note.  Some diuresis yesterday, BUN up but creatinine stable. Still with a lot of wheezing/rhonchi on exam.  Still some volume overload.  - Continue current IV Lasix today and follow response.  If creatinine remains stable may use a dose of metolazone tomorrow.  - Continue COPD exacerbation treatment, COPD likely playing a major role here.   Marca AnconaDalton Anup Brigham 03/04/2016 1:02 PM

## 2016-03-04 NOTE — Progress Notes (Signed)
Patient placed on BiPAP due to morning ABG CO2 results of 78. Patient states he does not wear BiPAP or CPAP QHS at home. RT will continue to monitor.

## 2016-03-05 ENCOUNTER — Inpatient Hospital Stay (HOSPITAL_COMMUNITY): Payer: Medicare Other

## 2016-03-05 ENCOUNTER — Encounter (HOSPITAL_COMMUNITY): Payer: Self-pay | Admitting: *Deleted

## 2016-03-05 DIAGNOSIS — N183 Chronic kidney disease, stage 3 (moderate): Secondary | ICD-10-CM

## 2016-03-05 DIAGNOSIS — I509 Heart failure, unspecified: Secondary | ICD-10-CM

## 2016-03-05 DIAGNOSIS — I5023 Acute on chronic systolic (congestive) heart failure: Secondary | ICD-10-CM | POA: Insufficient documentation

## 2016-03-05 DIAGNOSIS — J9601 Acute respiratory failure with hypoxia: Secondary | ICD-10-CM | POA: Diagnosis present

## 2016-03-05 DIAGNOSIS — J9602 Acute respiratory failure with hypercapnia: Secondary | ICD-10-CM

## 2016-03-05 DIAGNOSIS — N179 Acute kidney failure, unspecified: Secondary | ICD-10-CM

## 2016-03-05 LAB — GLUCOSE, CAPILLARY
GLUCOSE-CAPILLARY: 237 mg/dL — AB (ref 65–99)
GLUCOSE-CAPILLARY: 293 mg/dL — AB (ref 65–99)
GLUCOSE-CAPILLARY: 312 mg/dL — AB (ref 65–99)
Glucose-Capillary: 203 mg/dL — ABNORMAL HIGH (ref 65–99)
Glucose-Capillary: 320 mg/dL — ABNORMAL HIGH (ref 65–99)

## 2016-03-05 LAB — ECHOCARDIOGRAM COMPLETE
CHL CUP DOP CALC LVOT VTI: 29.6 cm
CHL CUP LVOT MV VTI INDEX: 0.46 cm2/m2
CHL CUP LVOT MV VTI: 0.87
CHL CUP MV M VEL: 112
FS: 20 % — AB (ref 28–44)
Height: 67 in
IV/PV OW: 0.71
LA diam end sys: 43 mm
LADIAMINDEX: 2.26 cm/m2
LASIZE: 43 mm
LAVOL: 81.2 mL
LAVOLA4C: 76.2 mL
LAVOLIN: 42.7 mL/m2
LV PW d: 14.1 mm — AB (ref 0.6–1.1)
LVOT area: 2.01 cm2
LVOT peak grad rest: 9 mmHg
LVOT peak vel: 149 cm/s
LVOTD: 16 mm
LVOTSV: 59 mL
Lateral S' vel: 6.8 cm/s
MVANNULUSVTI: 68.2 cm
Mean grad: 6 mmHg
RV TAPSE: 17.3 mm
Weight: 2754.87 oz

## 2016-03-05 LAB — CBC
HCT: 44.3 % (ref 39.0–52.0)
Hemoglobin: 13.1 g/dL (ref 13.0–17.0)
MCH: 28.5 pg (ref 26.0–34.0)
MCHC: 29.6 g/dL — AB (ref 30.0–36.0)
MCV: 96.3 fL (ref 78.0–100.0)
PLATELETS: 180 10*3/uL (ref 150–400)
RBC: 4.6 MIL/uL (ref 4.22–5.81)
RDW: 14.7 % (ref 11.5–15.5)
WBC: 17 10*3/uL — ABNORMAL HIGH (ref 4.0–10.5)

## 2016-03-05 LAB — BASIC METABOLIC PANEL
Anion gap: 6 (ref 5–15)
BUN: 47 mg/dL — AB (ref 6–20)
CHLORIDE: 96 mmol/L — AB (ref 101–111)
CO2: 35 mmol/L — AB (ref 22–32)
CREATININE: 1.45 mg/dL — AB (ref 0.61–1.24)
Calcium: 9.3 mg/dL (ref 8.9–10.3)
GFR calc Af Amer: 56 mL/min — ABNORMAL LOW (ref >60)
GFR calc non Af Amer: 48 mL/min — ABNORMAL LOW (ref >60)
Glucose, Bld: 155 mg/dL — ABNORMAL HIGH (ref 65–99)
Potassium: 4.9 mmol/L (ref 3.5–5.1)
Sodium: 137 mmol/L (ref 135–145)

## 2016-03-05 LAB — DIGOXIN LEVEL: Digoxin Level: 0.5 ng/mL — ABNORMAL LOW (ref 0.8–2.0)

## 2016-03-05 MED ORDER — METHYLPREDNISOLONE SODIUM SUCC 125 MG IJ SOLR: 60.0000 mg | INTRAMUSCULAR | Status: DC

## 2016-03-05 MED ORDER — FUROSEMIDE 10 MG/ML IJ SOLN
80.0000 mg | Freq: Two times a day (BID) | INTRAMUSCULAR | Status: AC
  Administered 2016-03-05: 80 mg via INTRAVENOUS
  Filled 2016-03-05: qty 8

## 2016-03-05 MED ORDER — INSULIN ASPART 100 UNIT/ML ~~LOC~~ SOLN
0.0000 [IU] | SUBCUTANEOUS | Status: DC
  Administered 2016-03-05: 8 [IU] via SUBCUTANEOUS
  Administered 2016-03-06: 11 [IU] via SUBCUTANEOUS
  Administered 2016-03-06: 3 [IU] via SUBCUTANEOUS

## 2016-03-05 MED ORDER — INSULIN GLARGINE 100 UNIT/ML ~~LOC~~ SOLN
5.0000 [IU] | Freq: Every day | SUBCUTANEOUS | Status: DC
  Administered 2016-03-05 – 2016-03-10 (×6): 5 [IU] via SUBCUTANEOUS
  Filled 2016-03-05 (×6): qty 0.05

## 2016-03-05 MED ORDER — DM-GUAIFENESIN ER 30-600 MG PO TB12
1.0000 | ORAL_TABLET | Freq: Two times a day (BID) | ORAL | Status: DC
  Administered 2016-03-05 – 2016-03-10 (×10): 1 via ORAL
  Filled 2016-03-05 (×10): qty 1

## 2016-03-05 NOTE — Progress Notes (Signed)
Patient ID: [name], male   DOB: 11-06-1945, 70 y.o.   MRN: [id number]      Advanced Heart Failure Rounding Note  Referring Physician: Dr. Thedore Mins Primary Physician: Cain Saupe Primary Cardiologist:  Dr. Shirlee Latch   Reason for Consultation: A/C systolic HF  Subjective:    Patient off Bipap today, says breathing is better.  Overall feels ok.  He diuresed again yesterady, -1995 net.    BUN/Creatinine a bit higher this morning.    Objective:   Weight Range: 172 lb 2.9 oz (78.1 kg) Body mass index is 26.97 kg/m.   Vital Signs:   Temp:  [97.3 F (36.3 C)-98.2 F (36.8 C)] 97.5 F (36.4 C) (09/28 0752) Pulse Rate:  [56-62] 56 (09/28 0752) Resp:  [15-24] 16 (09/28 0752) BP: (124-151)/(67-87) 151/80 (09/28 0752) SpO2:  [92 %-99 %] 92 % (09/28 0806) FiO2 (%):  [35 %] 35 % (09/27 2019) Last BM Date: 03/04/16  Weight change: Filed Weights   03/02/16 2327 03/03/16 0500 03/04/16 0831  Weight: 172 lb 13.5 oz (78.4 kg) 173 lb 1 oz (78.5 kg) 172 lb 2.9 oz (78.1 kg)    Intake/Output:   Intake/Output Summary (Last 24 hours) at 03/05/16 0845 Last data filed at 03/05/16 0753  Gross per 24 hour  Intake           105.33 ml  Output             2650 ml  Net         -2544.67 ml     Physical Exam: General:  Elderly and chronically ill appearing. BiPAP in place.  HEENT: Normal, poor personal grooming, Bipap in place.  Neck: supple. JVP 10 cm. Carotids 2+ bilat; no bruits. No thyromegaly or nodule noted.  Cor: PMI nondisplaced. Regular.  No M/G/R Lungs: Diminished throughout with scattered wheezes Abdomen: soft, NT, ND, no HSM. No bruits or masses. +BS  Extremities: no cyanosis, clubbing, rash. No edema.  Neuro: alert & orientedx3, cranial nerves grossly intact. moves all 4 extremities w/o difficulty. Affect flat  Telemetry: Reviewed personally, Pacing with underlying Afib - Rate controlled.  Labs: CBC  Recent Labs  03/03/16 0609 03/04/16 0450 03/05/16 0448  WBC 11.7*  14.6* 17.0*  NEUTROABS 10.9*  --   --   HGB 13.1 12.7* 13.1  HCT 43.4 42.3 44.3  MCV 95.6 96.4 96.3  PLT 138* 162 180   Basic Metabolic Panel  Recent Labs  03/03/16 0609 03/04/16 0450 03/05/16 0448  NA 139 138 137  K 5.0 5.0 4.9  CL 105 97* 96*  CO2 28 30 35*  GLUCOSE 172* 169* 155*  BUN 19 31* 47*  CREATININE 1.30* 1.35* 1.45*  CALCIUM 9.2 9.3 9.3  MG 1.9  --   --    Liver Function Tests  Recent Labs  03/02/16 1940  AST 38  ALT 34  ALKPHOS 103  BILITOT 1.2  PROT 7.3  ALBUMIN 3.9    Recent Labs  03/02/16 1940  LIPASE 41   Cardiac Enzymes  Recent Labs  03/03/16 0139 03/03/16 0609 03/03/16 1116  TROPONINI 0.05* 0.03* 0.03*    BNP: BNP (last 3 results)  Recent Labs  05/22/15 1645 05/22/15 2107 03/02/16 1940  BNP 604.2* 592.8* 361.9*    ProBNP (last 3 results) No results for input(s): PROBNP in the last 8760 hours.   D-Dimer No results for input(s): DDIMER in the last 72 hours. Hemoglobin A1C No results for input(s): HGBA1C in the last 72 hours.  Fasting Lipid Panel No results for input(s): CHOL, HDL, LDLCALC, TRIG, CHOLHDL, LDLDIRECT in the last 72 hours. Thyroid Function Tests No results for input(s): TSH, T4TOTAL, T3FREE, THYROIDAB in the last 72 hours.  Invalid input(s): FREET3  Other results:     Imaging/Studies:  Dg Chest Port 1 View  Result Date: 03/03/2016 CLINICAL DATA:  Restlessness. Coronary artery disease. Cardiac arrhythmia. EXAM: PORTABLE CHEST 1 VIEW COMPARISON:  March 03, 2016 study obtained earlier in the day FINDINGS: There remains underlying interstitial edema. There has been partial but incomplete clearing of airspace opacity from the left lower lobe. No new opacity evident. There is cardiomegaly with pulmonary venous hypertension. Pacemaker leads are attached to the right atrium, right ventricle, and left ventricle. Patient is status post mitral valve replacement. No adenopathy. There old healed rib fractures  on the right. IMPRESSION: Compared to earlier in the day, there is partial clearing of airspace opacity from the left base, likely partial clearing of alveolar edema. There is underlying interstitial edema elsewhere which is stable. No new opacity. Stable cardiac silhouette. No pneumothorax. Old rib fractures on the right noted. Electronically Signed   By: Bretta Bang III M.D.   On: 03/03/2016 14:07    Latest Echo  Latest Cath   Medications:     Scheduled Medications: . atorvastatin  20 mg Oral q1800  . azithromycin  500 mg Oral Daily  . carvedilol  6.25 mg Oral BID WC  . cefTRIAXone (ROCEPHIN)  IV  2 g Intravenous Q24H  . digoxin  0.125 mg Oral QHS  . fenofibrate  54 mg Oral Daily  . folic acid  1 mg Oral Daily  . furosemide  80 mg Intravenous Q12H  . Influenza vac split quadrivalent PF  0.5 mL Intramuscular Tomorrow-1000  . insulin aspart  0-9 Units Subcutaneous TID WC  . ipratropium-albuterol  3 mL Nebulization QID  . losartan  50 mg Oral Daily  . methylPREDNISolone (SOLU-MEDROL) injection  60 mg Intravenous Q12H  . multivitamin with minerals  1 tablet Oral Daily  . pneumococcal 23 valent vaccine  0.5 mL Intramuscular Tomorrow-1000  . rivaroxaban  20 mg Oral Q supper  . spironolactone  25 mg Oral QHS  . thiamine  100 mg Oral Daily   Or  . thiamine  100 mg Intravenous Daily    Infusions:    PRN Medications: acetaminophen, ipratropium-albuterol, LORazepam **OR** LORazepam, nitroGLYCERIN, ondansetron (ZOFRAN) IV   Assessment/Plan   1. Acute on Chronic systolic CHF: ECHO 05/23/15 EF 20-25%, mixed ischemic/nonischemic (ETOH) cardiomyopathy. S/p Medtronic CRT-D. JVP remains elevated, suspect he still has some volume overload.  However, BUN/creatinine rising.  - Repeat Echo pending (still).  - Would give 1 more day of IV Lasix, stop after pm dose.   - Continue spironolactone 25 mg daily - Continue Coreg 6.25 mg BID. - Continue digoxin 0.125, level ok today.   -  Continue Losartan 50 mg daily and spironolactone 25 daily.  - Pt has ongoing medical and dietary noncompliance.  2. AECOPD: Per primary. Currently on BiPAP, Nebs, Steroids, and abx for AECOPD.  Still wheezing/rhonchorous on exam. 3. Atrial fibrillation:Chronic. He has had AV nodal ablation with BiV pacing.  - With non-compliance to BID dosing, switched from Eliquis to Xarelto 20 mg. 4. CAD: Denies chest pain. LHC (3/16) with moderate distal LM stenosis (IVUS 6.9 mm2), 80% ostial LCx, total occlusion RCA with collaterals. Ostial LCx not ideal for PCI, managed medically.  - No ASA given anticoagulation use, now back on  statin.  5. Mitral valve replacement:Bioprosthetic valve currently, originally Bjork-Shiley. Valve stable on 7/16 echo.  - Repeat echo pending.   6. Smoking:  Continues to smoke > 1 ppd. Has continually refused cessation.  7. AKI: Mild with diuresis.  Would continue IV Lasix one more day then stop.   Length of Stay: 3  Marca AnconaDalton McLean  03/05/2016, 8:45 AM  Advanced Heart Failure Team Pager [phone] (M-F; 7a - 4p)  Please contact [facility] for night-coverage after hours (4p -7a ) and weekends on [email or web address]

## 2016-03-05 NOTE — Evaluation (Signed)
Clinical/Bedside Swallow Evaluation Patient Details  Name: [name] MRN: [id number] Date of Birth: 1945/09/18  Today's Date: 03/05/2016 Time: SLP Start Time (ACUTE ONLY): 0919 SLP Stop Time (ACUTE ONLY): 7782 SLP Time Calculation (min) (ACUTE ONLY): 19 min  Past Medical History:  Past Medical History:  Diagnosis Date  . AICD (automatic cardioverter/defibrillator) present   . Alcohol abuse   . Anxiety   . Bipolar disorder (HCC)   . Biventricular automatic implantable cardioverter defibrillator in situ    a. 01/2014 s/p MDT [id number] Viva XT CRT-D (ser # [id number]).  . CAD (coronary artery disease)    a. s/p prior PCI/stenting of the LCX;  b. 08/2014 MV: EF 20%, large septal, apical, and inferior infarct from apex to base, no ischemia;  c. 08/2014 NSTEMI/Cath: LM mod distal dzs extending into ostial LCX (80%), LAD tortuous, RI nl, OM mod dzs, RCA 100 CTO with R->R and L->R collats-->Med Rx.  . Chronic atrial fibrillation (HCC)    a. CHA2DS2VASc = 6->eliquis;  b. S/P AVN RFCA an BiV ICD placement.  . Chronic respiratory failure (HCC)   . Chronic systolic congestive heart failure (HCC)    a. 01/2014 Echo: EF 25-30%, mod conc LVH, mod dil LA.  . CKD (chronic kidney disease)    a. suspect stage II-III based on historical labs.  . Complete heart block (HCC)    a. In setting of prior AV nodal ablation r/t afib-->BiV ICD (01/2014).  . COPD (chronic obstructive pulmonary disease) (HCC)   . CVA (cerebral vascular accident) (HCC)    a. Multiple prior embolic strokes.  . Dementia   . Depression   . DVT (deep venous thrombosis) (HCC)   . Dyslipidemia   . Hyperglycemia   . Hypertension   . Mitral valve disease    a. remote mitral replacement with Bjork Shiley valve;  b. 06/2006 Redo MVR with tissue valve.  . Mixed Ischemic and Nonischemic Cardiomyopathy    a. 8/.2015 Echo: Ef 25-30%;  b. 01/2014 s/p MDT [id number] [place]umbling XT CRT-D (ser # [id number]).  . On home oxygen therapy    "3L at night"  (11/23/2015)  . Ventral hernia    Past Surgical History:  Past Surgical History:  Procedure Laterality Date  . AV NODE ABLATION  2007  . CARDIAC CATHETERIZATION  06/2006   Mild ostial L main stenosis, CFX stent patent, RCA occluded (old)  . CORONARY ARTERY BYPASS GRAFT  2008  . CYSTOSCOPY W/ URETERAL STENT PLACEMENT  07/12/2011   Procedure: CYSTOSCOPY WITH RETROGRADE PYELOGRAM/URETERAL STENT PLACEMENT;  Surgeon: Sebastian Ache, MD;  Location: [facility];  Service: Urology;  Laterality: Left;  . HERNIA REPAIR    . ICD GENERATOR CHANGE  02/01/2014   Gen change to: Medtronic VIVA pulse generator, serial number [id number]  . INSERT / REPLACE / REMOVE PACEMAKER    . LEFT HEART CATHETERIZATION WITH CORONARY ANGIOGRAM N/A 08/23/2014   Procedure: LEFT HEART CATHETERIZATION WITH CORONARY ANGIOGRAM;  Surgeon: Corky Crafts, MD;  Location: [facility] CATH LAB;  Service: Cardiovascular;  Laterality: N/A;  . MITRAL VALVE REPLACEMENT     remote [facility] valve 1245 with redo tissue valve 06/2006  . PACEMAKER GENERATOR CHANGE N/A 02/01/2014   Procedure: PACEMAKER GENERATOR CHANGE;  Surgeon: Duke Salvia, MD;  Location: [facility] CATH LAB;  Service: Cardiovascular;  Laterality: N/A;  . PACEMAKER PLACEMENT  2006   Changed to CRT-D in 2009   HPI:  70 y.o. male with  medical history significant of COPD, chronic respiratory  failure on home oxygen, systolic CHF last EF 20-25% in 05/2015, mixed cardiomyopathy, A. fib on Eliquis, CAD s/p PCI, s/p  MV replacement, CVA, PVD, HLD; presents with complaints of shortness of breath.  Dx with acute on chronic hypoic/hypercapnic respiratory failure with respiratory acidosis, acute on chronic systolic CHF, mild ARF, and mild elevation in troponin (likely demand ischemia).       Assessment / Plan / Recommendation Clinical Impression  Pt presented with normal vocal quality and a strong cough. Pt consumed trials of ice chips and pureed solids and showed no s/s of aspiration. During  trials of thin liquid pt had immediate coughing. Nectar thick liquid trials were administered and no further coughing occurred. Mild oral residue was observed following regular solid trial, but this cleared with additional sips of liquid. Given pt's decreased respiratory status and performance at bedside, recommend Dys 3 diet and nectar thick liquid. Will f/u for diet tolerance.    Aspiration Risk  Mild aspiration risk    Diet Recommendation Nectar-thick liquid;Dysphagia 3 (Mech soft)   Liquid Administration via: Cup;Spoon Medication Administration: Whole meds with puree Supervision: Staff to assist with self feeding;Full supervision/cueing for compensatory strategies Compensations: Minimize environmental distractions;Slow rate;Small sips/bites;Follow solids with liquid Postural Changes: Seated upright at 90 degrees;Remain upright for at least 30 minutes after po intake    Other  Recommendations Oral Care Recommendations: Oral care BID   Follow up Recommendations 24 hour supervision/assistance      Frequency and Duration min 2x/week  2 weeks       Prognosis Prognosis for Safe Diet Advancement: Good      Swallow Study   General HPI: 70 y.o. male with  medical history significant of COPD, chronic respiratory failure on home oxygen, systolic CHF last EF 20-25% in 05/2015, mixed cardiomyopathy, A. fib on Eliquis, CAD s/p PCI, s/p  MV replacement, CVA, PVD, HLD; presents with complaints of shortness of breath.  Dx with acute on chronic hypoic/hypercapnic respiratory failure with respiratory acidosis, acute on chronic systolic CHF, mild ARF, and mild elevation in troponin (likely demand ischemia).     Type of Study: Bedside Swallow Evaluation Previous Swallow Assessment: MBS 2002 Diet Prior to this Study: Thin liquids Temperature Spikes Noted: No Respiratory Status: Nasal cannula History of Recent Intubation: No Behavior/Cognition: Alert;Cooperative;Pleasant mood Oral Cavity Assessment:  Within Functional Limits Oral Care Completed by SLP: No Oral Cavity - Dentition: Edentulous Self-Feeding Abilities: Needs assist;Able to feed self Patient Positioning: Upright in bed Baseline Vocal Quality: Normal Volitional Cough: Strong Volitional Swallow: Able to elicit    Oral/Motor/Sensory Function Overall Oral Motor/Sensory Function: Within functional limits   Ice Chips Ice chips: Within functional limits Presentation: Spoon   Thin Liquid Thin Liquid: Impaired Presentation: Spoon;Cup Pharyngeal  Phase Impairments: Cough - Immediate    Nectar Thick Nectar Thick Liquid: Within functional limits Presentation: Cup   Honey Thick Honey Thick Liquid: Not tested   Puree Puree: Within functional limits Presentation: Spoon   Solid   GO   Solid: Impaired Presentation: Self Fed Oral Phase Functional Implications: Oral residue       Tollie EthHaleigh Ragan Zhane Bluitt, Student SLP  Caryl NeverHaleigh R Aarilyn Dye 03/05/2016,10:20 AM

## 2016-03-05 NOTE — Care Management Note (Signed)
Case Management Note  Patient Details  Name: ARVELL MALTEZ MRN: [id number] Date of Birth: 06/23/1945  Subjective/Objective:    Patient lives with son, Jayse Obst.  [phone].  Patient has a rolling walker at home and home oxygen with [facility]  At 2 liters.  Per son patient was living with daughter and he was not taking his medications but patient will will staying with him now and he will make sure he takes his medications.  Patient is already on Eliquis for afib.  He has a PCP and he has medication coverage and he has transportation at discharge   9/28 - per pt/ot eval no follow up needed.  NCM will cont to follow for dc needs.                  Action/Plan:   Expected Discharge Date:                  Expected Discharge Plan:  Home w Home Health Services  In-House Referral:     Discharge planning Services  CM Consult  Post Acute Care Choice:    Choice offered to:     DME Arranged:    DME Agency:     HH Arranged:    HH Agency:     Status of Service:  In process, will continue to follow  If discussed at Long Length of Stay Meetings, dates discussed:    Additional Comments:  [name], RN 03/05/2016, 11:52 AM

## 2016-03-05 NOTE — Progress Notes (Signed)
PROGRESS NOTE    [name]  OTR:[id number] DOB: [birth year] DOA: 03/02/2016 PCP: Cain Saupe, MD   Brief Narrative:  70 y.o.WM PMHx CVA, Bipolar disorder, Anxiety, Depression,, EtOH abuse, CVA, COPD, Chronic respiratory failure on home 3 L oxygen, Chronic Systolic CHF(EF 20-25% in 05/2015),mixed Cardiomyopathy, A. 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 Patient reported he had stopped taking some of his medications for 2 days because they "made him urinate too much."   Subjective: 9/28 A/O 3 (does not know when). States does not weigh himself daily nor does he know his base weight. Does not smoke anymore.   Assessment & Plan:   Active Problems:   Atrial fibrillation, chronic (HCC)   Automatic implantable cardioverter-defibrillator in situ   Chronic anticoagulation   COPD exacerbation (HCC)   CHF exacerbation (HCC)   Acute on chronic congestive heart failure (HCC)   Acute renal failure superimposed on stage 3 chronic kidney disease (HCC)   Acute respiratory failure with hypoxia and hypercapnia (HCC)   Acute on chronic hypoxic/hypercapnic respiratory failure with respiratory acidosis -due to combination of acute on chronic systolic CHF and COPD exacerbation  -Complete five-day course antibiotics -Solu-Medrol 60 mg daily -Flutter valve -DuoNeb QID  -Mucinex DM BID  Acute COPD exac - ?Pneumonia -Doubt true PNA, but abx do have a role in acute bronchospastic COPD exac so will plan to complete a 5 day tx course   Acute on chronic systolic CHF -Continue spironolactone 25 mg daily - Continue Coreg 6.25 mg BID. - Continue digoxin 0.125, level ok today.  - Continue Losartan 50 mg daily and spironolactone 25 daily.  -Last day of Lasix per cardiology -Strict in and out since admission -5.1 L -Daily  weight Filed Weights   03/03/16 0500 03/04/16 0831 03/05/16 1500  Weight: 78.5 kg (173 lb 1 oz) 78.1 kg (172 lb 2.9 oz) 76.7 kg (169 lb 1.5 oz)   Chronic atrial fibrillation(CHA2DS2 - VASc at least 4) -See CHF  - hx AV node ablation w/ BiV pacing  S/P mitral valve replacement  -Bioprosthetic valve   Acute on CKD stage III( Cr baseline~1.13-1.34) Lab Results  Component Value Date   CREATININE 1.45 (H) 03/05/2016   CREATININE 1.35 (H) 03/04/2016   CREATININE 1.30 (H) 03/03/2016  -Trending up with diuresis, CHF team has discontinued Lasix may be new baseline.  Mild elevation in troponin -Chest pain-free - likely due to demand ischemia   HXof alcohol abuse -Counseled to quit - cont CIWA protocol.  Dyslipidemia -Lipitor 20 mg daily -Lipid panel pending -Fenofibrate 54 mg daily  Hyperglycemia -Hemoglobin A1c pending -Start Lantus 5 units daily -Moderate SSI     DVT prophylaxis: Xarelto Code Status: Full Family Communication: None Disposition Plan: Per CHF team   Consultants:  Dr.Dalton S McLean Heart failure team      Procedures/Significant Events:  9/28 Echocardiogram:Left ventricle:mildly dilated. -LVEF = 30% to 35%. Hypokinesis of the inferolateral and  inferior myocardium. Hypokinesis of the apical myocardium.  - Mitral valve: A bioprosthesis was present and functioning  - Left atrium: moderately dilated.    Cultures 9/26 MRSA by PCR negative  Antimicrobials: Azithromycin 9/25 > Ceftriaxone 9/26 >   Devices None   LINES / TUBES:  None    Continuous Infusions:    Objective: Vitals:   03/05/16 1212 03/05/16 1414 03/05/16 1500 03/05/16 1556  BP:  116/62    Pulse:  (!) 58    Resp:  (!) 21    Temp:  97.8 F (36.6 C)    TempSrc:  Oral    SpO2: 94% 96%  94%  Weight:   76.7 kg (169 lb 1.5 oz)   Height:        Intake/Output Summary (Last 24 hours) at 03/05/16 1828 Last data filed at 03/05/16 1500  Gross per 24 hour  Intake                50 ml  Output             2650 ml  Net            -2600 ml   Filed Weights   03/03/16 0500 03/04/16 0831 03/05/16 1500  Weight: 78.5 kg (173 lb 1 oz) 78.1 kg (172 lb 2.9 oz) 76.7 kg (169 lb 1.5 oz)    Examination:  General: A/O 3 (does not know when), No acute respiratory distress Eyes: negative scleral hemorrhage, negative anisocoria, negative icterus ENT: Negative Runny nose, negative gingival bleeding, Neck:  Negative scars, masses, torticollis, lymphadenopathy, JVD Lungs: decreased breath sounds, with bibasilar rhonchi, mild expiratory wheeze.  Cardiovascular: Regular rate and rhythm without murmur gallop or rub normal S1 and S2 (paced) Abdomen: negative abdominal pain, positive distention,, positive soft, bowel sounds, no rebound, no ascites, no appreciable mass Extremities: No significant cyanosis, clubbing, or edema bilateral lower extremities Skin: Negative rashes, lesions, ulcers Psychiatric:  Negative depression, negative anxiety, negative fatigue, negative mania  Central nervous system:  Cranial nerves II through XII intact, tongue/uvula midline, all extremities muscle strength 5/5, sensation intact throughout, negative dysarthria, negative expressive aphasia, negative receptive aphasia.  .     Data Reviewed: Care during the described time interval was provided by me .  I have reviewed this patient's available data, including medical history, events of note, physical examination, and all test results as part of my evaluation. I have personally reviewed and interpreted all radiology studies.  CBC:  Recent Labs Lab 03/02/16 1940 03/03/16 0609 03/04/16 0450 03/05/16 0448  WBC 14.9* 11.7* 14.6* 17.0*  NEUTROABS  --  10.9*  --   --   HGB 13.6 13.1 12.7* 13.1  HCT 44.2 43.4 42.3 44.3  MCV 95.3 95.6 96.4 96.3  PLT 153 138* 162 180   Basic Metabolic Panel:  Recent Labs Lab 03/02/16 1940 03/03/16 0609 03/04/16 0450 03/05/16 0448  NA 139 139 138 137  K  5.1 5.0 5.0 4.9  CL 105 105 97* 96*  CO2 27 28 30  35*  GLUCOSE 148* 172* 169* 155*  BUN 16 19 31* 47*  CREATININE 1.13 1.30* 1.35* 1.45*  CALCIUM 9.2 9.2 9.3 9.3  MG  --  1.9  --   --    GFR: Estimated Creatinine Clearance: 45.6 mL/min (by C-G formula based on SCr of 1.45 mg/dL (H)). Liver Function Tests:  Recent Labs Lab 03/02/16 1940  AST 38  ALT 34  ALKPHOS 103  BILITOT 1.2  PROT 7.3  ALBUMIN 3.9    Recent Labs Lab 03/02/16 1940  LIPASE 41   No results for input(s): AMMONIA in the last 168 hours. Coagulation Profile:  Recent Labs Lab 03/02/16 1940  INR 1.19   Cardiac Enzymes:  Recent Labs Lab 03/03/16 0139 03/03/16 0609 03/03/16 1116  TROPONINI 0.05* 0.03* 0.03*   BNP (last 3 results) No results for input(s): PROBNP in the last 8760 hours. HbA1C: No results for  input(s): HGBA1C in the last 72 hours. CBG:  Recent Labs Lab 03/04/16 1718 03/04/16 2100 03/05/16 0749 03/05/16 1116 03/05/16 1657  GLUCAP 143* 185* 203* 320* 237*   Lipid Profile: No results for input(s): CHOL, HDL, LDLCALC, TRIG, CHOLHDL, LDLDIRECT in the last 72 hours. Thyroid Function Tests: No results for input(s): TSH, T4TOTAL, FREET4, T3FREE, THYROIDAB in the last 72 hours. Anemia Panel: No results for input(s): VITAMINB12, FOLATE, FERRITIN, TIBC, IRON, RETICCTPCT in the last 72 hours. Urine analysis:    Component Value Date/Time   COLORURINE YELLOW 12/24/2014 0311   APPEARANCEUR CLEAR 12/24/2014 0311   LABSPEC 1.010 12/24/2014 0311   PHURINE 5.5 12/24/2014 0311   GLUCOSEU NEGATIVE 12/24/2014 0311   HGBUR NEGATIVE 12/24/2014 0311   BILIRUBINUR NEGATIVE 12/24/2014 0311   KETONESUR NEGATIVE 12/24/2014 0311   PROTEINUR NEGATIVE 12/24/2014 0311   UROBILINOGEN 0.2 12/24/2014 0311   NITRITE NEGATIVE 12/24/2014 0311   LEUKOCYTESUR NEGATIVE 12/24/2014 0311   Sepsis Labs: @LABRCNTIP (procalcitonin:4,lacticidven:4)  ) Recent Results (from the past 240 hour(s))  MRSA PCR  Screening     Status: None   Collection Time: 03/03/16  2:34 AM  Result Value Ref Range Status   MRSA by PCR NEGATIVE NEGATIVE Final    Comment:        The GeneXpert MRSA Assay (FDA approved for NASAL specimens only), is one component of a comprehensive MRSA colonization surveillance program. It is not intended to diagnose MRSA infection nor to guide or monitor treatment for MRSA infections.          Radiology Studies: No results found.      Scheduled Meds: . atorvastatin  20 mg Oral q1800  . azithromycin  500 mg Oral Daily  . carvedilol  6.25 mg Oral BID WC  . cefTRIAXone (ROCEPHIN)  IV  2 g Intravenous Q24H  . dextromethorphan-guaiFENesin  1 tablet Oral BID  . digoxin  0.125 mg Oral QHS  . fenofibrate  54 mg Oral Daily  . folic acid  1 mg Oral Daily  . Influenza vac split quadrivalent PF  0.5 mL Intramuscular Tomorrow-1000  . insulin aspart  0-9 Units Subcutaneous TID WC  . ipratropium-albuterol  3 mL Nebulization QID  . losartan  50 mg Oral Daily  . [START ON 03/06/2016] methylPREDNISolone (SOLU-MEDROL) injection  60 mg Intravenous Q24H  . multivitamin with minerals  1 tablet Oral Daily  . pneumococcal 23 valent vaccine  0.5 mL Intramuscular Tomorrow-1000  . rivaroxaban  20 mg Oral Q supper  . spironolactone  25 mg Oral QHS  . thiamine  100 mg Oral Daily   Or  . thiamine  100 mg Intravenous Daily   Continuous Infusions:    LOS: 3 days    Time spent: 40 minutes    WOODS, Roselind MessierURTIS J, MD [name] Pager [phone]   If 7PM-7AM, please contact night-coverage [email or web address] Password [email or web address] 03/05/2016, 6:28 PM

## 2016-03-05 NOTE — Care Management Important Message (Signed)
Important Message  Patient Details  Name: RYETT EASTER MRN: [id number] Date of Birth: 1946-05-11   Medicare Important Message Given:  Yes    Dorena Bodo 03/05/2016, 2:50 PM

## 2016-03-05 NOTE — Progress Notes (Signed)
  Echocardiogram 2D Echocardiogram has been performed.  [name] 03/05/2016, 3:07 PM

## 2016-03-06 ENCOUNTER — Encounter (HOSPITAL_COMMUNITY): Payer: Self-pay | Admitting: Orthopedic Surgery

## 2016-03-06 LAB — BASIC METABOLIC PANEL
ANION GAP: 7 (ref 5–15)
BUN: 55 mg/dL — AB (ref 6–20)
CHLORIDE: 96 mmol/L — AB (ref 101–111)
CO2: 37 mmol/L — AB (ref 22–32)
Calcium: 9.6 mg/dL (ref 8.9–10.3)
Creatinine, Ser: 1.69 mg/dL — ABNORMAL HIGH (ref 0.61–1.24)
GFR calc Af Amer: 46 mL/min — ABNORMAL LOW (ref >60)
GFR, EST NON AFRICAN AMERICAN: 40 mL/min — AB (ref >60)
GLUCOSE: 238 mg/dL — AB (ref 65–99)
POTASSIUM: 4.4 mmol/L (ref 3.5–5.1)
Sodium: 140 mmol/L (ref 135–145)

## 2016-03-06 LAB — GLUCOSE, CAPILLARY
GLUCOSE-CAPILLARY: 194 mg/dL — AB (ref 65–99)
GLUCOSE-CAPILLARY: 108 mg/dL — AB (ref 65–99)
GLUCOSE-CAPILLARY: 227 mg/dL — AB (ref 65–99)
GLUCOSE-CAPILLARY: 257 mg/dL — AB (ref 65–99)
Glucose-Capillary: 153 mg/dL — ABNORMAL HIGH (ref 65–99)

## 2016-03-06 LAB — LIPID PANEL
Cholesterol: 175 mg/dL (ref 0–200)
HDL: 37 mg/dL — AB (ref >40)
LDL Cholesterol: 107 mg/dL — ABNORMAL HIGH (ref 0–99)
TRIGLYCERIDES: 155 mg/dL — AB (ref <150)
Total CHOL/HDL Ratio: 4.7 RATIO
VLDL: 31 mg/dL (ref 0–40)

## 2016-03-06 MED ORDER — FUROSEMIDE 40 MG PO TABS
40.0000 mg | ORAL_TABLET | Freq: Two times a day (BID) | ORAL | Status: DC
  Administered 2016-03-06 – 2016-03-09 (×6): 40 mg via ORAL
  Filled 2016-03-06 (×6): qty 1

## 2016-03-06 MED ORDER — STARCH (THICKENING) PO POWD
ORAL | Status: DC | PRN
  Filled 2016-03-06: qty 227

## 2016-03-06 MED ORDER — IPRATROPIUM-ALBUTEROL 0.5-2.5 (3) MG/3ML IN SOLN: 3.0000 mL | RESPIRATORY_TRACT | Status: DC | PRN

## 2016-03-06 MED ORDER — RESOURCE THICKENUP CLEAR PO POWD
ORAL | Status: DC | PRN
  Filled 2016-03-06: qty 125

## 2016-03-06 MED ORDER — ATORVASTATIN CALCIUM 40 MG PO TABS
40.0000 mg | ORAL_TABLET | Freq: Every day | ORAL | Status: DC
  Administered 2016-03-06 – 2016-03-09 (×4): 40 mg via ORAL
  Filled 2016-03-06 (×4): qty 1

## 2016-03-06 MED ORDER — INSULIN ASPART 100 UNIT/ML ~~LOC~~ SOLN
0.0000 [IU] | Freq: Three times a day (TID) | SUBCUTANEOUS | Status: DC
  Administered 2016-03-06: 5 [IU] via SUBCUTANEOUS
  Administered 2016-03-06: 8 [IU] via SUBCUTANEOUS
  Administered 2016-03-07: 3 [IU] via SUBCUTANEOUS
  Administered 2016-03-07: 5 [IU] via SUBCUTANEOUS
  Administered 2016-03-07: 2 [IU] via SUBCUTANEOUS
  Administered 2016-03-08: 3 [IU] via SUBCUTANEOUS
  Administered 2016-03-08: 4 [IU] via SUBCUTANEOUS
  Administered 2016-03-08: 3 [IU] via SUBCUTANEOUS
  Administered 2016-03-09 (×3): 5 [IU] via SUBCUTANEOUS
  Administered 2016-03-10: 3 [IU] via SUBCUTANEOUS

## 2016-03-06 MED ORDER — MOMETASONE FURO-FORMOTEROL FUM 200-5 MCG/ACT IN AERO
2.0000 | INHALATION_SPRAY | Freq: Two times a day (BID) | RESPIRATORY_TRACT | Status: DC
  Administered 2016-03-07 – 2016-03-10 (×6): 2 via RESPIRATORY_TRACT
  Filled 2016-03-06 (×3): qty 8.8

## 2016-03-06 NOTE — Progress Notes (Signed)
Speech Language Pathology Treatment: Dysphagia  Patient Details Name: [name] MRN: [id number] DOB: [birth year] Today's Date: 03/06/2016 Time: 3704-8889 SLP Time Calculation (min) (ACUTE ONLY): 10 min  Assessment / Plan / Recommendation Clinical Impression  Pt consumed thin liquids by cup without overt signs of aspiration on initial sips; however, when prompted to complete the 3 ounce water test, pt was unable to consume that amount of liquid without immediate coughing and turning red in the face. No overt s/s of aspiration were seen with nectar thick liquids, even with larger boluses. Mastication is prolonged with regular solids, likely due to lack of dentition. Recommend to continue with Dys 3 diet for energy conservation and nectar thick liquids. Will continue to follow - if he continues to show signs of dysphagia as his respirations improve, he may benefit from [facility].   HPI HPI: 70 y.o. male with  medical history significant of COPD, chronic respiratory failure on home oxygen, systolic CHF last EF 20-25% in 05/2015, mixed cardiomyopathy, A. fib on Eliquis, CAD s/p PCI, s/p  MV replacement, CVA, PVD, HLD; presents with complaints of shortness of breath.  Dx with acute on chronic hypoic/hypercapnic respiratory failure with respiratory acidosis, acute on chronic systolic CHF, mild ARF, and mild elevation in troponin (likely demand ischemia).          SLP Plan  Continue with current plan of care     Recommendations  Diet recommendations: Dysphagia 3 (mechanical soft);Nectar-thick liquid Liquids provided via: Cup Medication Administration: Whole meds with puree Supervision: Patient able to self feed;Intermittent supervision to cue for compensatory strategies Compensations: Minimize environmental distractions;Slow rate;Small sips/bites;Follow solids with liquid Postural Changes and/or Swallow Maneuvers: Seated upright 90 degrees;Upright 30-60 min after meal                Oral Care  Recommendations: Oral care BID Follow up Recommendations: 24 hour supervision/assistance Plan: Continue with current plan of care       GO                Maxcine Ham 03/06/2016, 3:46 PM  Maxcine Ham, M.A. CCC-SLP [phone]

## 2016-03-06 NOTE — Progress Notes (Signed)
Occupational Therapy Treatment Patient Details Name: [name] MRN: [id number] DOB: 1946/04/10 Today's Date: 03/06/2016    History of present illness 70 y.o. male with  medical history significant of COPD, chronic respiratory failure on home oxygen, systolic CHF last EF 20-25% in 05/2015, mixed cardiomyopathy, A. fib on Eliquis, CAD s/p PCI, s/p  MV replacement, CVA, PVD, HLD; presents with complaints of shortness of breath.  Dx with acute on chronic hypoic/hypercapnic respiratory failure with respiratory acidosis, acute on chronic systolic CHF, mild ARF, and mild elevation in troponin (likely demand ischemia).       OT comments  Pt and son educated in energy conservation strategies and pursed lip breathing. Reinforced with handout. Recommended pt use a shower seat and 02 when showering. Pt easily able to access feet by crossing over opposite knee. Mild unsteadiness in standing. Pt is HOH, difficult to assess cognition, needs further evaluation.  Follow Up Recommendations  No OT follow up;Supervision/Assistance - 24 hour    Equipment Recommendations  Tub/shower seat    Recommendations for Other Services      Precautions / Restrictions Precautions Precaution Comments: monitor O2 sats with activity Restrictions Weight Bearing Restrictions: No       Mobility Bed Mobility Overal bed mobility: Needs Assistance Bed Mobility: Sit to Supine       Sit to supine: Supervision   General bed mobility comments: supervision for safety/lines  Transfers Overall transfer level: Needs assistance Equipment used: 1 person hand held assist Transfers: Sit to/from Stand Sit to Stand: Min guard              Balance Overall balance assessment: Needs assistance Sitting-balance support: Feet supported Sitting balance-Leahy Scale: Normal       Standing balance-Leahy Scale: Good                     ADL Overall ADL's : Needs assistance/impaired     Grooming: Min  guard;Standing;Wash/dry hands   ADL Comments: educated pt and son at length in breathing techniques and energy conservation.                                         Vision                     Perception     Praxis      Cognition   Behavior During Therapy: WFL for tasks assessed/performed Overall Cognitive Status: Difficult to assess Area of Impairment: Safety/judgement          Safety/Judgement: Decreased awareness of safety          Extremity/Trunk Assessment               Exercises     Shoulder Instructions       General Comments      Pertinent Vitals/ Pain       Pain Assessment: Faces Faces Pain Scale: No hurt  Home Living                                          Prior Functioning/Environment              Frequency  Min 2X/week        Progress Toward Goals  OT Goals(current goals can now be  found in the care plan section)  Progress towards OT goals: Progressing toward goals  Acute Rehab OT Goals Patient Stated Goal: to go home Time For Goal Achievement: 03/18/16 Potential to Achieve Goals: Good  Plan Discharge plan remains appropriate    Co-evaluation                 End of Session Equipment Utilized During Treatment: Oxygen (2.5L)   Activity Tolerance Patient tolerated treatment well   Patient Left in bed;with call bell/phone within reach;with family/visitor present   Nurse Communication  (??cognition)        Time: 1140-1155 OT Time Calculation (min): 15 min  Charges: OT General Charges $OT Visit: 1 Procedure OT Treatments $Self Care/Home Management : 8-22 mins  Evern Bio 03/06/2016, 12:15 PM  [phone]

## 2016-03-06 NOTE — Progress Notes (Signed)
Physical Therapy Treatment Patient Details Name: [name] MRN: [id number] DOB: [birth year] Today's Date: 03/06/2016    History of Present Illness 70 y.o. male with  medical history significant of COPD, chronic respiratory failure on home oxygen, systolic CHF last EF 20-25% in 05/2015, mixed cardiomyopathy, A. fib on Eliquis, CAD s/p PCI, s/p  MV replacement, CVA, PVD, HLD; presents with complaints of shortness of breath.  Dx with acute on chronic hypoic/hypercapnic respiratory failure with respiratory acidosis, acute on chronic systolic CHF, mild ARF, and mild elevation in troponin (likely demand ischemia).        PT Comments    Pt is progressing well with his gait.  Able to demonstrate good distance and improving stability on his feet.  He did desat while walking on 3 LO2 [place] to 87%.  Pt may need increased O2 at home.  He reports he only uses it at night at baseline.    Follow Up Recommendations  No PT follow up;Supervision - Intermittent     Equipment Recommendations  Other (comment) (increased home O2 use? at baseline only uses at night)    Recommendations for Other Services   NA     Precautions / Restrictions Precautions Precautions: Other (comment) Precaution Comments: monitor O2 sats with activity    Mobility  Bed Mobility Overal bed mobility: Needs Assistance Bed Mobility: Supine to Sit     Supine to sit: Supervision;HOB elevated     General bed mobility comments: supervision for safety, pt using railing to get to EOB.   Transfers Overall transfer level: Needs assistance Equipment used: 1 person hand held assist Transfers: Sit to/from Stand Sit to Stand: Min guard         General transfer comment: Min guard for safety and balance  Ambulation/Gait Ambulation/Gait assistance: Min guard Ambulation Distance (Feet): 300 Feet Assistive device: 1 person hand held assist Gait Pattern/deviations: Step-through pattern;Staggering left;Staggering right Gait  velocity: decreased   General Gait Details: mildly staggering gait pattern , min guard assist for safety and balance during gait.  Pt reports no h/o falls or stumbles at home, he agrees that being in the bed has made him a little wobbly.  O2 sats down to 87% on 3 L O2 [place] during gait.  At least 2 mins of seated rest and pursed lip breating to get it back above 90%.          Balance Overall balance assessment: Needs assistance Sitting-balance support: No upper extremity supported;Feet supported Sitting balance-Leahy Scale: Normal     Standing balance support: Single extremity supported Standing balance-Leahy Scale: Fair                      Cognition Arousal/Alertness: Awake/alert Behavior During Therapy: WFL for tasks assessed/performed Overall Cognitive Status: No family/caregiver present to determine baseline cognitive functioning                             Pertinent Vitals/Pain Pain Assessment: No/denies pain           PT Goals (current goals can now be found in the care plan section) Acute Rehab PT Goals Patient Stated Goal: to go home Progress towards PT goals: Progressing toward goals    Frequency    Min 3X/week      PT Plan Current plan remains appropriate       End of Session Equipment Utilized During Treatment: Gait belt;Oxygen Activity Tolerance: Patient limited by  fatigue Patient left: in chair;Other (comment) (in transport chair with RN staff moving to another unit)     Time: 4656-8127 PT Time Calculation (min) (ACUTE ONLY): 18 min  Charges:  $Gait Training: 8-22 mins            [name], PT, DPT [phone]   03/06/2016, 5:21 PM

## 2016-03-06 NOTE — Progress Notes (Signed)
Patient ID: [name], male   DOB: January 01, 1946, 70 y.o.   MRN: [id number]      Advanced Heart Failure Rounding Note  Referring Physician: Dr. Thedore MinsSingh Primary Physician: Cain Saupeammie Fulp Primary Cardiologist:  Dr. Shirlee LatchMcLean   Reason for Consultation: A/C systolic HF  Subjective:    Good diuresis yesterday, breathing is better.     BUN/Creatinine a bit higher this morning.    Echo: EF 30-35%, moderately decreased RV systolic function, stable bioprosthetic mitral valve.   Objective:   Weight Range: 167 lb 8.8 oz (76 kg) Body mass index is 26.24 kg/m.   Vital Signs:   Temp:  [97.3 F (36.3 C)-98.4 F (36.9 C)] 97.7 F (36.5 C) (09/29 0758) Pulse Rate:  [58-64] 59 (09/29 0758) Resp:  [17-25] 18 (09/29 0758) BP: (116-142)/(57-79) 138/73 (09/29 0758) SpO2:  [93 %-98 %] 93 % (09/29 0758) FiO2 (%):  [35 %] 35 % (09/28 2051) Weight:  [167 lb 8.8 oz (76 kg)-169 lb 1.5 oz (76.7 kg)] 167 lb 8.8 oz (76 kg) (09/29 0500) Last BM Date: 03/01/16  Weight change: Filed Weights   03/04/16 0831 03/05/16 1500 03/06/16 0500  Weight: 172 lb 2.9 oz (78.1 kg) 169 lb 1.5 oz (76.7 kg) 167 lb 8.8 oz (76 kg)    Intake/Output:   Intake/Output Summary (Last 24 hours) at 03/06/16 0819 Last data filed at 03/06/16 0700  Gross per 24 hour  Intake               50 ml  Output             2650 ml  Net            -2600 ml     Physical Exam: General:  Elderly and chronically ill appearing. BiPAP in place.  HEENT: Normal, poor personal grooming, Bipap in place.  Neck: supple. JVP 7-8 cm. Carotids 2+ bilat; no bruits. No thyromegaly or nodule noted.  Cor: PMI nondisplaced. Regular.  No M/G/R Lungs: Occasional rhonchi. Abdomen: soft, NT, ND, no HSM. No bruits or masses. +BS  Extremities: no cyanosis, clubbing, rash. No edema.  Neuro: alert & orientedx3, cranial nerves grossly intact. moves all 4 extremities w/o difficulty. Affect flat  Telemetry: Reviewed personally, Pacing with underlying Afib - Rate  controlled.  Labs: CBC  Recent Labs  03/04/16 0450 03/05/16 0448  WBC 14.6* 17.0*  HGB 12.7* 13.1  HCT 42.3 44.3  MCV 96.4 96.3  PLT 162 180   Basic Metabolic Panel  Recent Labs  03/05/16 0448 03/06/16 0237  NA 137 140  K 4.9 4.4  CL 96* 96*  CO2 35* 37*  GLUCOSE 155* 238*  BUN 47* 55*  CREATININE 1.45* 1.69*  CALCIUM 9.3 9.6   Liver Function Tests No results for input(s): AST, ALT, ALKPHOS, BILITOT, PROT, ALBUMIN in the last 72 hours. No results for input(s): LIPASE, AMYLASE in the last 72 hours. Cardiac Enzymes  Recent Labs  03/03/16 1116  TROPONINI 0.03*    BNP: BNP (last 3 results)  Recent Labs  05/22/15 1645 05/22/15 2107 03/02/16 1940  BNP 604.2* 592.8* 361.9*    ProBNP (last 3 results) No results for input(s): PROBNP in the last 8760 hours.   D-Dimer No results for input(s): DDIMER in the last 72 hours. Hemoglobin A1C No results for input(s): HGBA1C in the last 72 hours. Fasting Lipid Panel  Recent Labs  03/06/16 0237  CHOL 175  HDL 37*  LDLCALC 107*  TRIG 155*  CHOLHDL 4.7  Thyroid Function Tests No results for input(s): TSH, T4TOTAL, T3FREE, THYROIDAB in the last 72 hours.  Invalid input(s): FREET3  Other results:     Imaging/Studies:  No results found.  Latest Echo  Latest Cath   Medications:     Scheduled Medications: . atorvastatin  40 mg Oral q1800  . azithromycin  500 mg Oral Daily  . carvedilol  6.25 mg Oral BID WC  . cefTRIAXone (ROCEPHIN)  IV  2 g Intravenous Q24H  . dextromethorphan-guaiFENesin  1 tablet Oral BID  . digoxin  0.125 mg Oral QHS  . fenofibrate  54 mg Oral Daily  . folic acid  1 mg Oral Daily  . furosemide  40 mg Oral BID  . Influenza vac split quadrivalent PF  0.5 mL Intramuscular Tomorrow-1000  . insulin aspart  0-15 Units Subcutaneous Q4H  . insulin glargine  5 Units Subcutaneous Daily  . ipratropium-albuterol  3 mL Nebulization QID  . losartan  50 mg Oral Daily  .  methylPREDNISolone (SOLU-MEDROL) injection  60 mg Intravenous Q24H  . multivitamin with minerals  1 tablet Oral Daily  . pneumococcal 23 valent vaccine  0.5 mL Intramuscular Tomorrow-1000  . rivaroxaban  20 mg Oral Q supper  . spironolactone  25 mg Oral QHS  . thiamine  100 mg Oral Daily   Or  . thiamine  100 mg Intravenous Daily    Infusions:    PRN Medications: acetaminophen, ipratropium-albuterol, nitroGLYCERIN, ondansetron (ZOFRAN) IV, RESOURCE THICKENUP CLEAR   Assessment/Plan   1. Acute on Chronic systolic CHF: ECHO this admission with EF 30-35%, moderately decreased RV systolic function, stable bioprosthetic mitral valve. Mixed ischemic/nonischemic (ETOH) cardiomyopathy. S/p [employer] CRT-D. Volume status improved and BUN/creatinine up.  IV Lasix stopped after yesterday evening's dose. - Can restart po Lasix 40 bid this evening.  He was not taking this for about a week prior to admission.   - Continue spironolactone 25 mg daily - Continue Coreg 6.25 mg BID. - Continue digoxin 0.125, level ok today.   - Continue Losartan 50 mg daily.  - Pt has ongoing medical and dietary noncompliance. Would be a good candidate for paramedicine. 2. AECOPD: Per primary. Currently on BiPAP, Nebs, Steroids, and abx for AECOPD.  Lungs sound better. 3. Atrial fibrillation:Chronic. He has had AV nodal ablation with BiV pacing.  - With non-compliance to BID dosing, switched from Eliquis to Xarelto 20 mg. 4. CAD: Denies chest pain. LHC (3/16) with moderate distal LM stenosis (IVUS 6.9 mm2), 80% ostial LCx, total occlusion RCA with collaterals. Ostial LCx not ideal for PCI, managed medically.  - No ASA given anticoagulation use. - Increase atorvastatin to 40 with LDL > 70.   5. Mitral valve replacement:Bioprosthetic valve currently, originally Bjork-Shiley. Valve stable on echo this admission.  6. Smoking:  Continues to smoke > 1 ppd. Has continually refused cessation.  7. AKI: Mild with  diuresis.  No Lasix this morning, start po this evening.   Length of Stay: 4  Marca Ancona  03/06/2016, 8:19 AM  Advanced Heart Failure Team Pager [phone] (M-F; 7a - 4p)  Please contact [facility] for night-coverage after hours (4p -7a ) and weekends on [email or web address]

## 2016-03-06 NOTE — Progress Notes (Signed)
[name] - Stepdown/ICU TEAM  [name]  ZOX:[id number] DOB: 07-06-1945 DOA: 03/02/2016 PCP: Cain Saupe, MD    Brief Narrative:  70 y.o.malewith history of COPD, chronic respiratory failure on home oxygen, systolic CHF(EF 20-25% in 05/2015),mixed cardiomyopathy, A. fib on Eliquis,CAD s/p PCI, s/p MVreplacement, CVA, PVD, and HLD who presented with shortness of breath. Patient reported he had stopped taking some of his medications for 2 days because they "made him urinate too much."  Subjective: The pt is feeling better in general.  He denies current cp, sob, n/v, or abdom pain.  He is mildly confused.  He has not yet been up/out of bed to a signif extent.    Assessment & Plan:  Acute on chronic hypoxic/hypercapnic respiratory failure with respiratory acidosis due to combination of acute on chronic systolic CHF and COPD exacerbation  Acute COPD exac - ?Pneumonia Doubt true PNA, but abx do have a role in acute bronchospastic COPD exac so will complete a 5 day tx course - on home O2 at 2LPM at all times - stop steroids today as no longer wheezing   Acute on chronic systolic CHF EF 20-25% - noncompliant with medications and diet - CHF Team following and dosing diuretic - counseled on need to comply w/ meds and diet    Filed Weights   03/04/16 0831 03/05/16 1500 03/06/16 0500  Weight: 78.[name] kg (172 lb 2.9 oz) 76.7 kg (169 lb [name].5 oz) 76 kg (167 lb 8.8 oz)    Mild ARF Follow w/ ongoing diuretic use - crt slowly trending upward - diuretic being titrated back to oral dosing today   Recent Labs Lab 03/02/16 1940 03/03/16 0609 03/04/16 0450 03/05/16 0448 03/06/16 0237  CREATININE [name].13 [name].30* [name].35* [name].45* [name].69*    Mild elevation in troponin Chest pain-free - likely due to demand ischemia - TTE noted areas of hypokinesis - Cards following and tx medically   HX of alcohol abuse Counseled to quit - now off CIWA - some mild delirium - follow   Dyslipidemia on  statin and fenofibrate  Chronic atrial fibrillation CHA2DS2 - VASc at least 4 - on coreg, digoxin and Xarelto - hx AV node ablation w/ BiV pacing  S/P mitral valve replacement  Bioprosthetic valve   Hyperglycemia likely steroid induced - no hx of DM - A1c pending   DVT prophylaxis: Xarelto  Code Status: FULL CODE Family Communication: spoke w/ son at bedside  Disposition Plan: transfer to CHF floor - titrate diuretic - begin PT/OT - follow crt   Consultants:  CHF Team   Procedures: TTE - EF 30-35% - unable to comment on diastolic fxn - normal prosthetic mitral valve   Antimicrobials:  Azithromycin 9/25 > 9/29 Ceftriaxone 9/26 > 9/29  Objective: Blood pressure 138/73, pulse (!) 59, temperature 97.7 F (36.5 C), temperature source Oral, resp. rate 18, height 5\' 7"  ([name].702 m), weight 76 kg (167 lb 8.8 oz), SpO2 93 %.  Intake/Output Summary (Last 24 hours) at 03/06/16 1059 Last data filed at 03/06/16 0700  Gross per 24 hour  Intake                0 ml  Output             2250 ml  Net            -2250 ml   Filed Weights   03/04/16 0831 03/05/16 1500 03/06/16 0500  Weight: 78.[name] kg (172 lb 2.9 oz) 76.7 kg (169  lb [name].5 oz) 76 kg (167 lb 8.8 oz)    Examination: General: no acute distress on [place] oxygen  Lungs: Clear to auscultation bilaterally without wheezes or crackles Cardiovascular: Regular rate without murmur gallop or rub  Abdomen: Nontender, nondistended, soft, bowel sounds positive, no rebound, no ascites, no appreciable mass Extremities: No significant cyanosis, or clubbing - trace edema bilateral lower extremities  CBC:  Recent Labs Lab 03/02/16 1940 03/03/16 0609 03/04/16 0450 03/05/16 0448  WBC 14.9* 11.7* 14.6* 17.0*  NEUTROABS  --  10.9*  --   --   HGB 13.6 13.[name] 12.7* 13.[name]  HCT 44.2 43.4 42.3 44.3  MCV 95.3 95.6 96.4 96.3  PLT 153 138* 162 180   Basic Metabolic Panel:  Recent Labs Lab 03/02/16 1940 03/03/16 0609 03/04/16 0450 03/05/16 0448  03/06/16 0237  NA 139 139 138 137 140  K 5.[name] 5.0 5.0 4.9 4.4  CL 105 105 97* 96* 96*  CO2 27 28 30  35* 37*  GLUCOSE 148* 172* 169* 155* 238*  BUN 16 19 31* 47* 55*  CREATININE [name].13 [name].30* [name].35* [name].45* [name].69*  CALCIUM 9.2 9.2 9.3 9.3 9.6  MG  --  [name].9  --   --   --    GFR: Estimated Creatinine Clearance: 39.[name] mL/min (by C-G formula based on SCr of [name].69 mg/dL (H)).  Liver Function Tests:  Recent Labs Lab 03/02/16 1940  AST 38  ALT 34  ALKPHOS 103  BILITOT [name].2  PROT 7.3  ALBUMIN 3.9    Recent Labs Lab 03/02/16 1940  LIPASE 41   Coagulation Profile:  Recent Labs Lab 03/02/16 1940  INR [name].19    Cardiac Enzymes:  Recent Labs Lab 03/03/16 0139 03/03/16 0609 03/03/16 1116  TROPONINI 0.05* 0.03* 0.03*    HbA1C: Hgb A1c MFr Bld  Date/Time Value Ref Range Status  02/24/2008 09:01 PM   Final   5.8 (NOTE)   The ADA recommends the following therapeutic goal for glycemic   control related to Hgb A1C measurement:   Goal of Therapy:   < 7.0% Hgb A1C   Reference: American Diabetes Association: Clinical Practice   Recommendations 2008, Diabetes Care,  2008, 31:(Suppl [name]).  02/24/2008 05:55 AM   Final   5.7 (NOTE)   The ADA recommends the following therapeutic goal for glycemic   control related to Hgb A1C measurement:   Goal of Therapy:   < 7.0% Hgb A1C   Reference: American Diabetes Association: Clinical Practice   Recommendations 2008, Diabetes Care,  2008, 31:(Suppl [name]).    CBG:  Recent Labs Lab 03/05/16 1657 03/05/16 1958 03/05/16 2308 03/06/16 0349 03/06/16 0805  GLUCAP 237* 293* 312* 194* 108*    Recent Results (from the past 240 hour(s))  MRSA PCR Screening     Status: None   Collection Time: 03/03/16  2:34 AM  Result Value Ref Range Status   MRSA by PCR NEGATIVE NEGATIVE Final    Comment:        The GeneXpert MRSA Assay (FDA approved for NASAL specimens only), is one component of a comprehensive MRSA colonization surveillance program. It is  not intended to diagnose MRSA infection nor to guide or monitor treatment for MRSA infections.      Scheduled Meds: . atorvastatin  40 mg Oral q1800  . azithromycin  500 mg Oral Daily  . carvedilol  6.25 mg Oral BID WC  . cefTRIAXone (ROCEPHIN)  IV  2 g Intravenous Q24H  . dextromethorphan-guaiFENesin  [name] tablet Oral BID  .  digoxin  0.125 mg Oral QHS  . fenofibrate  54 mg Oral Daily  . folic acid  [name] mg Oral Daily  . furosemide  40 mg Oral BID  . insulin aspart  0-15 Units Subcutaneous Q4H  . insulin glargine  5 Units Subcutaneous Daily  . losartan  50 mg Oral Daily  . methylPREDNISolone (SOLU-MEDROL) injection  60 mg Intravenous Q24H  . multivitamin with minerals  [name] tablet Oral Daily  . rivaroxaban  20 mg Oral Q supper  . spironolactone  25 mg Oral QHS  . thiamine  100 mg Oral Daily   Or  . thiamine  100 mg Intravenous Daily     LOS: 4 days   Lonia Blood, MD [name] Office  [phone] Pager - Text Page per Amion as per below:  On-Call/Text Page:      [email or web address]      password [email or web address]  If 7PM-7AM, please contact night-coverage [email or web address] Password [email or web address] 03/06/2016, 10:59 AM

## 2016-03-06 NOTE — Progress Notes (Signed)
Pt admitted from 3S. Pt alert and oriented; no acute distress. Pt placed on telemetry. Vitals obtained.   03/06/16 1635  Vitals  Temp 97.8 F (36.6 C)  Temp Source Oral  BP 133/72  MAP (mmHg) 88  BP Location Left Arm  BP Method Automatic  Patient Position (if appropriate) Lying  Pulse Rate 62  Pulse Rate Source Dinamap  Resp 18  Oxygen Therapy  SpO2 97 %  O2 Device Nasal Cannula  O2 Flow Rate (L/min) 3 L/min  Pain Assessment  Pain Assessment No/denies pain  Height and Weight  Height 5\' 3"  (1.6 m)  Weight 75 kg (165 lb 4.8 oz)  BSA (Calculated - sq m) 1.83 sq meters  BMI (Calculated) 29.3  Weight in (lb) to have BMI = 25 140.8

## 2016-03-07 DIAGNOSIS — Z720 Tobacco use: Secondary | ICD-10-CM

## 2016-03-07 DIAGNOSIS — E785 Hyperlipidemia, unspecified: Secondary | ICD-10-CM

## 2016-03-07 DIAGNOSIS — I2510 Atherosclerotic heart disease of native coronary artery without angina pectoris: Secondary | ICD-10-CM

## 2016-03-07 DIAGNOSIS — I5043 Acute on chronic combined systolic (congestive) and diastolic (congestive) heart failure: Secondary | ICD-10-CM

## 2016-03-07 DIAGNOSIS — Z7901 Long term (current) use of anticoagulants: Secondary | ICD-10-CM

## 2016-03-07 DIAGNOSIS — F1010 Alcohol abuse, uncomplicated: Secondary | ICD-10-CM

## 2016-03-07 DIAGNOSIS — Z95810 Presence of automatic (implantable) cardiac defibrillator: Secondary | ICD-10-CM

## 2016-03-07 LAB — HEMOGLOBIN A1C
Hgb A1c MFr Bld: 7.5 % — ABNORMAL HIGH (ref 4.8–5.6)
MEAN PLASMA GLUCOSE: 169 mg/dL

## 2016-03-07 LAB — GLUCOSE, CAPILLARY
GLUCOSE-CAPILLARY: 175 mg/dL — AB (ref 65–99)
GLUCOSE-CAPILLARY: 206 mg/dL — AB (ref 65–99)
Glucose-Capillary: 146 mg/dL — ABNORMAL HIGH (ref 65–99)
Glucose-Capillary: 162 mg/dL — ABNORMAL HIGH (ref 65–99)

## 2016-03-07 LAB — BASIC METABOLIC PANEL
Anion gap: 7 (ref 5–15)
BUN: 45 mg/dL — AB (ref 6–20)
CHLORIDE: 99 mmol/L — AB (ref 101–111)
CO2: 39 mmol/L — ABNORMAL HIGH (ref 22–32)
Calcium: 9.8 mg/dL (ref 8.9–10.3)
Creatinine, Ser: 1.52 mg/dL — ABNORMAL HIGH (ref 0.61–1.24)
GFR calc Af Amer: 53 mL/min — ABNORMAL LOW (ref >60)
GFR calc non Af Amer: 45 mL/min — ABNORMAL LOW (ref >60)
GLUCOSE: 183 mg/dL — AB (ref 65–99)
POTASSIUM: 4.8 mmol/L (ref 3.5–5.1)
Sodium: 145 mmol/L (ref 135–145)

## 2016-03-07 LAB — BRAIN NATRIURETIC PEPTIDE: B NATRIURETIC PEPTIDE 5: 183.9 pg/mL — AB (ref 0.0–100.0)

## 2016-03-07 MED ORDER — IPRATROPIUM-ALBUTEROL 0.5-2.5 (3) MG/3ML IN SOLN
3.0000 mL | Freq: Four times a day (QID) | RESPIRATORY_TRACT | Status: DC
  Administered 2016-03-07 – 2016-03-10 (×11): 3 mL via RESPIRATORY_TRACT
  Filled 2016-03-07 (×9): qty 3

## 2016-03-07 MED ORDER — LIVING WELL WITH DIABETES BOOK
Freq: Once | Status: DC
  Filled 2016-03-07: qty 1

## 2016-03-07 MED ORDER — ALBUTEROL SULFATE (2.5 MG/3ML) 0.083% IN NEBU: 2.5000 mg | INHALATION_SOLUTION | RESPIRATORY_TRACT | Status: DC | PRN

## 2016-03-07 NOTE — Progress Notes (Addendum)
PROGRESS NOTE    Dominic CoilHarry W Schappert  ONG:[id number] DOB: Jul 08, 1945 DOA: 03/02/2016 PCP: Cain SaupeFULP, CAMMIE, MD     Brief Narrative:  [name] is a 70 y.o.malewith history of COPD, chronic respiratory failure on home oxygen, systolic CHF(EF 20-25% in 05/2015),mixed cardiomyopathy, A. fib on Eliquis,CAD s/p PCI, s/p MVreplacement, CVA, PVD, and HLD who presented with shortness of breath. Patient reported he had stopped taking some of his medications for 2 days because they "made him urinate too much."  Assessment & Plan:   Principal Problem:   Acute respiratory failure with hypoxia and hypercapnia (HCC) Active Problems:   Atrial fibrillation, chronic (HCC)   Hyperlipidemia   Automatic implantable cardioverter-defibrillator in situ   Chronic anticoagulation   Alcohol abuse   Cigarette smoker   Chronic respiratory failure with hypoxia (HCC)   CAD (coronary artery disease)   COPD exacerbation (HCC)   Acute on chronic systolic heart failure (HCC)  Acute on chronic hypoxic/hypercapnic respiratory failure with respiratory acidosis -Secondary to combination of acute on chronic systolic CHF and COPD exacerbation -Requires 2L [place] at baseline  -Off bipap   Acute COPD exacerbation/bronchitis  -5 day tx course rocephin/azithromax  -Received steroids this admission, completed course  -Duoneb, dulera  -Mucinex   Acute on chronic systolic CHF -EF 44-01%30-35%, moderately dec RV systolic function  -Down 13 lbs and 7L  -IV Lasix transitioned to oral BID  -Spironolactone daily -Coreg twice daily -Digoxin daily -Losartan daily -Cardiology following, appreciate  Chronic atrial fibrillation -Hx AV node ablation w/ BiV pacing -Continue coreg, digoxin and Xarelto (eliquis switched to xarelto for once daily dosing)   Mild elevation in Cr -Baseline Cr 1.1-1.3  -Monitor BMP   Mild elevation in troponin -Chest pain-free -Likely due to demand ischemia   Dyslipidemia -Lipitor increased  to 40mg  -Continue fenofibrate  S/P mitral valve replacement  -Bioprosthetic valve   DM type 2, new dx  -A1c 7.5. No previous hx of Dm.  -Will start metformin at time of discharge -Lantus 5u daily  -Continue SSI  -Consult diabetic ed   Alcohol abuse -Counseled to quit  -Now off CIWA  -Vit B1, folate daily   DVT prophylaxis: xarelto Code Status: full Family Communication: no family at bedside Disposition Plan: continue diuresis    Consultants:   Cardiology   Procedures:   TTE - EF 30-35% - unable to comment on diastolic fxn - normal prosthetic mitral valve   Antimicrobials:   Azithromycin 9/25 > 9/29  Ceftriaxone 9/26 > 9/29   Subjective: Patient resting comfortably in bed. He has no complaints. He continues to have a cough that is nonproductive. Denies any chest pain or shortness of breath today.  Objective: Vitals:   03/07/16 0030 03/07/16 0525 03/07/16 0813 03/07/16 1225  BP: 115/64 133/74  (!) 147/65  Pulse: 60 (!) 59  (!) 59  Resp: 20 18  18   Temp: 98.4 F (36.9 C) 97.7 F (36.5 C)  97.8 F (36.6 C)  TempSrc: Oral Oral  Oral  SpO2: 97% 97% 97% 97%  Weight:  73 kg (160 lb 14.4 oz)    Height:        Intake/Output Summary (Last 24 hours) at 03/07/16 1452 Last data filed at 03/07/16 1437  Gross per 24 hour  Intake              600 ml  Output             1895 ml  Net            -  1295 ml   Filed Weights   03/06/16 0500 03/06/16 1635 03/07/16 0525  Weight: 76 kg (167 lb 8.8 oz) 75 kg (165 lb 4.8 oz) 73 kg (160 lb 14.4 oz)    Examination:  General exam: Appears calm and comfortable  Respiratory system: Coarse breath sounds bilaterally with wheeze, respiratory effort is normal, no conversational dyspnea Cardiovascular system: S1 & S2 heard, irregular rhythm, rate controlled. No JVD, murmurs, rubs, gallops or clicks. No pedal edema. Gastrointestinal system: Abdomen is nondistended, soft and nontender. No organomegaly or masses felt. Normal bowel  sounds heard. Central nervous system: Alert. No focal neurological deficits. Extremities: Symmetric 5 x 5 power. Skin: No rashes, lesions or ulcers Psychiatry: Judgement and insight appear normal. Mood & affect appropriate.   Data Reviewed: I have personally reviewed following labs and imaging studies  CBC:  Recent Labs Lab 03/02/16 1940 03/03/16 0609 03/04/16 0450 03/05/16 0448  WBC 14.9* 11.7* 14.6* 17.0*  NEUTROABS  --  10.9*  --   --   HGB 13.6 13.1 12.7* 13.1  HCT 44.2 43.4 42.3 44.3  MCV 95.3 95.6 96.4 96.3  PLT 153 138* 162 180   Basic Metabolic Panel:  Recent Labs Lab 03/03/16 0609 03/04/16 0450 03/05/16 0448 03/06/16 0237 03/07/16 0417  NA 139 138 137 140 145  K 5.0 5.0 4.9 4.4 4.8  CL 105 97* 96* 96* 99*  CO2 28 30 35* 37* 39*  GLUCOSE 172* 169* 155* 238* 183*  BUN 19 31* 47* 55* 45*  CREATININE 1.30* 1.35* 1.45* 1.69* 1.52*  CALCIUM 9.2 9.3 9.3 9.6 9.8  MG 1.9  --   --   --   --    GFR: Estimated Creatinine Clearance: 41.6 mL/min (by C-G formula based on SCr of 1.52 mg/dL (H)). Liver Function Tests:  Recent Labs Lab 03/02/16 1940  AST 38  ALT 34  ALKPHOS 103  BILITOT 1.2  PROT 7.3  ALBUMIN 3.9    Recent Labs Lab 03/02/16 1940  LIPASE 41   No results for input(s): AMMONIA in the last 168 hours. Coagulation Profile:  Recent Labs Lab 03/02/16 1940  INR 1.19   Cardiac Enzymes:  Recent Labs Lab 03/03/16 0139 03/03/16 0609 03/03/16 1116  TROPONINI 0.05* 0.03* 0.03*   BNP (last 3 results) No results for input(s): PROBNP in the last 8760 hours. HbA1C:  Recent Labs  03/06/16 0237  HGBA1C 7.5*   CBG:  Recent Labs Lab 03/06/16 1100 03/06/16 1534 03/06/16 2117 03/07/16 0634 03/07/16 1139  GLUCAP 227* 257* 153* 146* 175*   Lipid Profile:  Recent Labs  03/06/16 0237  CHOL 175  HDL 37*  LDLCALC 107*  TRIG 155*  CHOLHDL 4.7   Thyroid Function Tests: No results for input(s): TSH, T4TOTAL, FREET4, T3FREE,  THYROIDAB in the last 72 hours. Anemia Panel: No results for input(s): VITAMINB12, FOLATE, FERRITIN, TIBC, IRON, RETICCTPCT in the last 72 hours. Sepsis Labs: No results for input(s): PROCALCITON, LATICACIDVEN in the last 168 hours.  Recent Results (from the past 240 hour(s))  MRSA PCR Screening     Status: None   Collection Time: 03/03/16  2:34 AM  Result Value Ref Range Status   MRSA by PCR NEGATIVE NEGATIVE Final    Comment:        The GeneXpert MRSA Assay (FDA approved for NASAL specimens only), is one component of a comprehensive MRSA colonization surveillance program. It is not intended to diagnose MRSA infection nor to guide or monitor treatment for MRSA infections.  Radiology Studies: No results found.      Scheduled Meds: . atorvastatin  40 mg Oral q1800  . carvedilol  6.25 mg Oral BID WC  . dextromethorphan-guaiFENesin  1 tablet Oral BID  . digoxin  0.125 mg Oral QHS  . fenofibrate  54 mg Oral Daily  . folic acid  1 mg Oral Daily  . furosemide  40 mg Oral BID  . insulin aspart  0-15 Units Subcutaneous TID WC  . insulin glargine  5 Units Subcutaneous Daily  . ipratropium-albuterol  3 mL Nebulization QID  . living well with diabetes book   Does not apply Once  . mometasone-formoterol  2 puff Inhalation BID  . multivitamin with minerals  1 tablet Oral Daily  . rivaroxaban  20 mg Oral Q supper  . spironolactone  25 mg Oral QHS  . thiamine  100 mg Oral Daily   Continuous Infusions:    LOS: 5 days    Time spent: 30 minutes    Noralee Stain, DO [name] Pager [phone]  If 7PM-7AM, please contact night-coverage [email or web address] Password [email or web address] 03/07/2016, 2:52 PM

## 2016-03-07 NOTE — Progress Notes (Signed)
Patient Name: [name] Date of Encounter: 03/07/2016  Hospital Problem List     Active Problems:   Atrial fibrillation, chronic (HCC)   Automatic implantable cardioverter-defibrillator in situ   Chronic anticoagulation   COPD exacerbation (HCC)   CHF exacerbation (HCC)   Acute on chronic congestive heart failure (HCC)   Acute renal failure superimposed on stage 3 chronic kidney disease (HCC)   Acute respiratory failure with hypoxia and hypercapnia (HCC)    Patient Profile     70 y.o.malewith history of COPD, chronic respiratory failure on home oxygen, systolic CHF(EF 20-25% in 05/2015),mixed cardiomyopathy, A. fib on Eliquis,CAD s/p PCI, s/p MVreplacement, CVA, PVD, and HLD who presented with shortness of breath. Patient reported he had stopped taking some of his medications for 2 days prior to admission  because they "made him urinate too much."  Subjective   He reports that his breathing feels back to baseline.    Inpatient Medications    . atorvastatin  40 mg Oral q1800  . carvedilol  6.25 mg Oral BID WC  . dextromethorphan-guaiFENesin  1 tablet Oral BID  . digoxin  0.125 mg Oral QHS  . fenofibrate  54 mg Oral Daily  . folic acid  1 mg Oral Daily  . furosemide  40 mg Oral BID  . insulin aspart  0-15 Units Subcutaneous TID WC  . insulin glargine  5 Units Subcutaneous Daily  . living well with diabetes book   Does not apply Once  . mometasone-formoterol  2 puff Inhalation BID  . multivitamin with minerals  1 tablet Oral Daily  . rivaroxaban  20 mg Oral Q supper  . spironolactone  25 mg Oral QHS  . thiamine  100 mg Oral Daily    Vital Signs    Vitals:   03/06/16 1959 03/07/16 0030 03/07/16 0525 03/07/16 0813  BP: 121/60 115/64 133/74   Pulse: 60 60 (!) 59   Resp: 18 20 18    Temp: 97.6 F (36.4 C) 98.4 F (36.9 C) 97.7 F (36.5 C)   TempSrc: Oral Oral Oral   SpO2: 98% 97% 97% 97%  Weight:   160 lb 14.4 oz (73 kg)   Height:         Intake/Output Summary (Last 24 hours) at 03/07/16 1109 Last data filed at 03/07/16 1027  Gross per 24 hour  Intake              360 ml  Output             1645 ml  Net            -1285 ml   Filed Weights   03/06/16 0500 03/06/16 1635 03/07/16 0525  Weight: 167 lb 8.8 oz (76 kg) 165 lb 4.8 oz (75 kg) 160 lb 14.4 oz (73 kg)    Physical Exam    GEN: Well nourished, well developed, in   no acute distress.  Neck: Supple, no JVD, carotid bruits, or masses. Cardiac: RRR, no rubs, or gallops. No clubbing, cyanosis, no edema.  Radials/DP/PT 2+ and equal bilaterally.  Respiratory:  Respirations  regular and unlabored, decreased breath sounds bilaterally. GI: Soft, nontender, nondistended, BS + x 4. Neuro:  Strength and sensation are intact.  Seems to be mildly confused   Labs    CBC  Recent Labs  03/05/16 0448  WBC 17.0*  HGB 13.1  HCT 44.3  MCV 96.3  PLT 180   Basic Metabolic Panel  Recent Labs  03/06/16 0237 03/07/16 0417  NA 140 145  K 4.4 4.8  CL 96* 99*  CO2 37* 39*  GLUCOSE 238* 183*  BUN 55* 45*  CREATININE 1.69* 1.52*  CALCIUM 9.6 9.8   Liver Function Tests No results for input(s): AST, ALT, ALKPHOS, BILITOT, PROT, ALBUMIN in the last 72 hours. No results for input(s): LIPASE, AMYLASE in the last 72 hours. Cardiac Enzymes No results for input(s): CKTOTAL, CKMB, CKMBINDEX, TROPONINI in the last 72 hours. BNP Invalid input(s): POCBNP D-Dimer No results for input(s): DDIMER in the last 72 hours. Hemoglobin A1C  Recent Labs  03/06/16 0237  HGBA1C 7.5*   Fasting Lipid Panel  Recent Labs  03/06/16 0237  CHOL 175  HDL 37*  LDLCALC 107*  TRIG 155*  CHOLHDL 4.7   Thyroid Function Tests No results for input(s): TSH, T4TOTAL, T3FREE, THYROIDAB in the last 72 hours.  Invalid input(s): FREET3  Telemetry    Ventricular pacing with PVCs.   ECG    NA  Radiology    Dg Chest Port 1 View  Result Date: 03/03/2016 CLINICAL DATA:   Restlessness. Coronary artery disease. Cardiac arrhythmia. EXAM: PORTABLE CHEST 1 VIEW COMPARISON:  March 03, 2016 study obtained earlier in the day FINDINGS: There remains underlying interstitial edema. There has been partial but incomplete clearing of airspace opacity from the left lower lobe. No new opacity evident. There is cardiomegaly with pulmonary venous hypertension. Pacemaker leads are attached to the right atrium, right ventricle, and left ventricle. Patient is status post mitral valve replacement. No adenopathy. There old healed rib fractures on the right. IMPRESSION: Compared to earlier in the day, there is partial clearing of airspace opacity from the left base, likely partial clearing of alveolar edema. There is underlying interstitial edema elsewhere which is stable. No new opacity. Stable cardiac silhouette. No pneumothorax. Old rib fractures on the right noted. Electronically Signed   By: Bretta BangWilliam  Woodruff III M.D.   On: 03/03/2016 14:07   Dg Chest Port 1 View  Result Date: 03/03/2016 CLINICAL DATA:  Shortness of breath EXAM: PORTABLE CHEST 1 VIEW COMPARISON:  03/02/2016 FINDINGS: Chronic cardiomegaly. Direct and transvenous pacer/ ICD wires in unchanged position. Status post mitral valve replacement. Low volume chest with patchy bilateral opacity. Possible pleural fluid or thickening on the left. No visible pneumothorax. IMPRESSION: Unchanged bilateral lung opacity which could be infectious or congestive. Electronically Signed   By: Marnee SpringJonathon  Watts M.D.   On: 03/03/2016 07:23   Dg Chest Portable 1 View  Result Date: 03/02/2016 CLINICAL DATA:  Nausea and vomiting for 3 days. Cough. Wheezing. Hypoxia. EXAM: PORTABLE CHEST 1 VIEW COMPARISON:  11/22/2015 FINDINGS: Pacer/defibrillator leads appear stable. Prosthetic tricuspid valve. Moderate enlargement of the cardiopericardial silhouette with low lung volumes and bilateral interstitial and patchy airspace opacities. Mildly elevated right  hemidiaphragm. Calcifications along the left hemidiaphragm. Old right rib deformities, healed. IMPRESSION: 1. Bilateral airspace opacities, left greater than right, especially in the perihilar region, accompanied by indistinct pulmonary vasculature and moderate enlargement of the cardiopericardial silhouette, favoring acute pulmonary edema over multilobar pneumonia. 2. Elevated right hemidiaphragm. 3. Stable appearance of the pacer/defibrillator leads and prosthetic mitral valve. Electronically Signed   By: Gaylyn RongWalter  Liebkemann M.D.   On: 03/02/2016 20:21    Assessment & Plan    ACUTE ON CHRONIC SYSTOLIC AND DIASTOLIC HF:  Continued good urine output.  Creat is stable.  Continue current therapy.    ATRIAL FIB:  Tolerating Xarelto and rate control.   CAD:  As  previously listed.  Continue medical management.     Signed, Rollene Rotunda, MD  03/07/2016, 11:09 AM

## 2016-03-07 NOTE — Progress Notes (Addendum)
Inpatient Diabetes Program Recommendations  AACE/ADA: New Consensus Statement on Inpatient Glycemic Control (2015)  Target Ranges:  Prepandial:   less than 140 mg/dL      Peak postprandial:   less than 180 mg/dL (1-2 hours)      Critically ill patients:  140 - 180 mg/dL   Lab Results  Component Value Date   GLUCAP 146 (H) 03/07/2016   HGBA1C 7.5 (H) 03/06/2016   Review of Glycemic Control  Diabetes history: New diagnosis A1c 7.5% this admission Current orders for Inpatient glycemic control: Lantus 5 Daily, Novolog Moderate (0-15 units) TID  Inpatient Diabetes Program Recommendations:   Considering patient's A1c level of 7.5%, CHF, and renal function, may want to consider Tradjenta 5 mg Daily at discharge.  Orders placed for Living well with Diabetes booklet, DM educational videos, and guidance for RN to teach patient DM survival skills to know at time of discharge.  Patient also has insurance and would qualify for outpatient education. If apropriate and patient agrees place order for outpatient education.  Patient will need Glucose meter kit at time of discharge order #[id number]  Thanks,  Tama Headings RN, MSN, [facility] Inpatient Diabetes Coordinator Team Pager [phone] (8a-5p)

## 2016-03-08 DIAGNOSIS — J9611 Chronic respiratory failure with hypoxia: Secondary | ICD-10-CM

## 2016-03-08 DIAGNOSIS — J9602 Acute respiratory failure with hypercapnia: Secondary | ICD-10-CM

## 2016-03-08 DIAGNOSIS — E784 Other hyperlipidemia: Secondary | ICD-10-CM

## 2016-03-08 DIAGNOSIS — J9601 Acute respiratory failure with hypoxia: Secondary | ICD-10-CM

## 2016-03-08 DIAGNOSIS — F17210 Nicotine dependence, cigarettes, uncomplicated: Secondary | ICD-10-CM

## 2016-03-08 LAB — BASIC METABOLIC PANEL
Anion gap: 6 (ref 5–15)
BUN: 39 mg/dL — AB (ref 6–20)
CALCIUM: 10.4 mg/dL — AB (ref 8.9–10.3)
CO2: 44 mmol/L — ABNORMAL HIGH (ref 22–32)
CREATININE: 1.23 mg/dL (ref 0.61–1.24)
Chloride: 95 mmol/L — ABNORMAL LOW (ref 101–111)
GFR calc Af Amer: >60 mL/min (ref >60)
GFR, EST NON AFRICAN AMERICAN: 59 mL/min — AB (ref >60)
GLUCOSE: 155 mg/dL — AB (ref 65–99)
POTASSIUM: 4.9 mmol/L (ref 3.5–5.1)
SODIUM: 145 mmol/L (ref 135–145)

## 2016-03-08 LAB — GLUCOSE, CAPILLARY
GLUCOSE-CAPILLARY: 153 mg/dL — AB (ref 65–99)
GLUCOSE-CAPILLARY: 175 mg/dL — AB (ref 65–99)
GLUCOSE-CAPILLARY: 184 mg/dL — AB (ref 65–99)
Glucose-Capillary: 186 mg/dL — ABNORMAL HIGH (ref 65–99)
Glucose-Capillary: 171 mg/dL — ABNORMAL HIGH (ref 65–99)

## 2016-03-08 NOTE — Progress Notes (Deleted)
Patient has been brought outside food. Patient has been educated on his diet and still continues to be noncompliant.

## 2016-03-08 NOTE — Progress Notes (Signed)
PROGRESS NOTE    [name]  ZOX:[id number] DOB: 08/02/1945 DOA: 03/02/2016 PCP: Cain Saupe, MD     Brief Narrative:  [name] is a 70 y.o.malewith history of COPD, chronic respiratory failure on home oxygen, systolic CHF(EF 20-25% in 05/2015),mixed cardiomyopathy, A. fib on Eliquis,CAD s/p PCI, s/p MVreplacement, CVA, PVD, and HLD who presented with shortness of breath. Patient reported he had stopped taking some of his medications for 2 days because they "made him urinate too much."  Assessment & Plan:   Principal Problem:   Acute respiratory failure with hypoxia and hypercapnia (HCC) Active Problems:   Atrial fibrillation, chronic (HCC)   Hyperlipidemia   Automatic implantable cardioverter-defibrillator in situ   Chronic anticoagulation   Alcohol abuse   Cigarette smoker   Chronic respiratory failure with hypoxia (HCC)   CAD (coronary artery disease)   COPD exacerbation (HCC)   Acute on chronic systolic heart failure (HCC)  Acute on chronic hypoxic/hypercapnic respiratory failure with respiratory acidosis -Secondary to combination of acute on chronic systolic CHF and COPD exacerbation -Requires 2L [place] at baseline  -Off bipap   Acute COPD exacerbation/bronchitis  -5 day tx course rocephin/azithromax  -Received steroids this admission, completed course  -Duoneb, dulera  -Mucinex  -Incentive spirometry and flutter valve per RT   Acute on chronic systolic CHF -EF 81-19%, moderately dec RV systolic function  -Down 13 lbs and >8L  -IV Lasix transitioned to oral BID  -Spironolactone daily -Coreg twice daily -Digoxin daily -Losartan daily -Cardiology following, appreciate -Repeat CXR tomorrow AM   Chronic atrial fibrillation -Hx AV node ablation w/ BiV pacing -Continue coreg, digoxin and Xarelto (eliquis switched to xarelto for once daily dosing)   Mild elevation in Cr -Baseline Cr 1.1-1.3  -Monitor BMP   Mild elevation in troponin -Chest  pain-free -Likely due to demand ischemia   Dyslipidemia -Lipitor increased to 40mg  -Continue fenofibrate  S/P mitral valve replacement  -Bioprosthetic valve   DM type 2, new dx  -A1c 7.5. No previous hx of Dm.  -Will start metformin at time of discharge -Lantus 5u daily  -Continue SSI  -Consult diabetic ed   Alcohol abuse -Counseled to quit  -Now off CIWA  -Vit B1, folate daily   DVT prophylaxis: xarelto Code Status: full Family Communication: spoke with son in Social worker, [place] today  Disposition Plan: continue diuresis    Consultants:   Cardiology   Procedures:   TTE - EF 30-35% - unable to comment on diastolic fxn - normal prosthetic mitral valve   Antimicrobials:   Azithromycin 9/25 > 9/29  Ceftriaxone 9/26 > 9/29   Subjective: Patient resting comfortably in bed. He has no complaints. He continues to have a cough that is nonproductive. Denies any chest pain or shortness of breath today.   Objective: Vitals:   03/08/16 0405 03/08/16 0903 03/08/16 1137 03/08/16 1225  BP: (!) 150/63   (!) 132/59  Pulse: 61   (!) 59  Resp: 18   20  Temp: 98 F (36.7 C)   98.4 F (36.9 C)  TempSrc: Oral   Oral  SpO2: 95% 96% 97% 93%  Weight: 72.8 kg (160 lb 8 oz)     Height:        Intake/Output Summary (Last 24 hours) at 03/08/16 1331 Last data filed at 03/08/16 0832  Gross per 24 hour  Intake              960 ml  Output  1801 ml  Net             -841 ml   Filed Weights   03/06/16 1635 03/07/16 0525 03/08/16 0405  Weight: 75 kg (165 lb 4.8 oz) 73 kg (160 lb 14.4 oz) 72.8 kg (160 lb 8 oz)    Examination:  General exam: Appears calm and comfortable  Respiratory system: Coarse breath sounds bilaterally with wheeze, respiratory effort is normal, no conversational dyspnea Cardiovascular system: S1 & S2 heard, irregular rhythm, rate controlled. No JVD, murmurs, rubs, gallops or clicks. No pedal edema. Gastrointestinal system: Abdomen is nondistended, soft  and nontender. No organomegaly or masses felt. Normal bowel sounds heard. Central nervous system: Alert. No focal neurological deficits. Extremities: Symmetric 5 x 5 power. Skin: No rashes, lesions or ulcers Psychiatry: Judgement and insight appear normal. Mood & affect appropriate.   Data Reviewed: I have personally reviewed following labs and imaging studies  CBC:  Recent Labs Lab 03/02/16 1940 03/03/16 0609 03/04/16 0450 03/05/16 0448  WBC 14.9* 11.7* 14.6* 17.0*  NEUTROABS  --  10.9*  --   --   HGB 13.6 13.1 12.7* 13.1  HCT 44.2 43.4 42.3 44.3  MCV 95.3 95.6 96.4 96.3  PLT 153 138* 162 180   Basic Metabolic Panel:  Recent Labs Lab 03/03/16 0609 03/04/16 0450 03/05/16 0448 03/06/16 0237 03/07/16 0417 03/08/16 0515  NA 139 138 137 140 145 145  K 5.0 5.0 4.9 4.4 4.8 4.9  CL 105 97* 96* 96* 99* 95*  CO2 28 30 35* 37* 39* 44*  GLUCOSE 172* 169* 155* 238* 183* 155*  BUN 19 31* 47* 55* 45* 39*  CREATININE 1.30* 1.35* 1.45* 1.69* 1.52* 1.23  CALCIUM 9.2 9.3 9.3 9.6 9.8 10.4*  MG 1.9  --   --   --   --   --    GFR: Estimated Creatinine Clearance: 51.5 mL/min (by C-G formula based on SCr of 1.23 mg/dL). Liver Function Tests:  Recent Labs Lab 03/02/16 1940  AST 38  ALT 34  ALKPHOS 103  BILITOT 1.2  PROT 7.3  ALBUMIN 3.9    Recent Labs Lab 03/02/16 1940  LIPASE 41   No results for input(s): AMMONIA in the last 168 hours. Coagulation Profile:  Recent Labs Lab 03/02/16 1940  INR 1.19   Cardiac Enzymes:  Recent Labs Lab 03/03/16 0139 03/03/16 0609 03/03/16 1116  TROPONINI 0.05* 0.03* 0.03*   BNP (last 3 results) No results for input(s): PROBNP in the last 8760 hours. HbA1C:  Recent Labs  03/06/16 0237  HGBA1C 7.5*   CBG:  Recent Labs Lab 03/07/16 1139 03/07/16 1634 03/07/16 2106 03/08/16 0709 03/08/16 1148  GLUCAP 175* 206* 162* 153* 186*   Lipid Profile:  Recent Labs  03/06/16 0237  CHOL 175  HDL 37*  LDLCALC 107*  TRIG  155*  CHOLHDL 4.7   Thyroid Function Tests: No results for input(s): TSH, T4TOTAL, FREET4, T3FREE, THYROIDAB in the last 72 hours. Anemia Panel: No results for input(s): VITAMINB12, FOLATE, FERRITIN, TIBC, IRON, RETICCTPCT in the last 72 hours. Sepsis Labs: No results for input(s): PROCALCITON, LATICACIDVEN in the last 168 hours.  Recent Results (from the past 240 hour(s))  MRSA PCR Screening     Status: None   Collection Time: 03/03/16  2:34 AM  Result Value Ref Range Status   MRSA by PCR NEGATIVE NEGATIVE Final    Comment:        The GeneXpert MRSA Assay (FDA approved for NASAL specimens only),  is one component of a comprehensive MRSA colonization surveillance program. It is not intended to diagnose MRSA infection nor to guide or monitor treatment for MRSA infections.          Radiology Studies: No results found.      Scheduled Meds: . atorvastatin  40 mg Oral q1800  . carvedilol  6.25 mg Oral BID WC  . dextromethorphan-guaiFENesin  1 tablet Oral BID  . digoxin  0.125 mg Oral QHS  . fenofibrate  54 mg Oral Daily  . folic acid  1 mg Oral Daily  . furosemide  40 mg Oral BID  . insulin aspart  0-15 Units Subcutaneous TID WC  . insulin glargine  5 Units Subcutaneous Daily  . ipratropium-albuterol  3 mL Nebulization QID  . living well with diabetes book   Does not apply Once  . mometasone-formoterol  2 puff Inhalation BID  . multivitamin with minerals  1 tablet Oral Daily  . rivaroxaban  20 mg Oral Q supper  . spironolactone  25 mg Oral QHS  . thiamine  100 mg Oral Daily   Continuous Infusions:    LOS: 6 days    Time spent: 30 minutes    Noralee Stain, DO [name] Pager [phone]  If 7PM-7AM, please contact night-coverage [email or web address] Password [email or web address] 03/08/2016, 1:31 PM

## 2016-03-09 ENCOUNTER — Inpatient Hospital Stay (HOSPITAL_COMMUNITY): Payer: Medicare Other

## 2016-03-09 LAB — BASIC METABOLIC PANEL
ANION GAP: 6 (ref 5–15)
BUN: 37 mg/dL — ABNORMAL HIGH (ref 6–20)
CALCIUM: 10 mg/dL (ref 8.9–10.3)
CO2: 44 mmol/L — AB (ref 22–32)
Chloride: 95 mmol/L — ABNORMAL LOW (ref 101–111)
Creatinine, Ser: 1.17 mg/dL (ref 0.61–1.24)
GFR calc Af Amer: >60 mL/min (ref >60)
GFR calc non Af Amer: >60 mL/min (ref >60)
GLUCOSE: 209 mg/dL — AB (ref 65–99)
Potassium: 4.6 mmol/L (ref 3.5–5.1)
Sodium: 145 mmol/L (ref 135–145)

## 2016-03-09 LAB — GLUCOSE, CAPILLARY
GLUCOSE-CAPILLARY: 212 mg/dL — AB (ref 65–99)
GLUCOSE-CAPILLARY: 207 mg/dL — AB (ref 65–99)
GLUCOSE-CAPILLARY: 198 mg/dL — AB (ref 65–99)
Glucose-Capillary: 204 mg/dL — ABNORMAL HIGH (ref 65–99)

## 2016-03-09 MED ORDER — SACUBITRIL-VALSARTAN 24-26 MG PO TABS
1.0000 | ORAL_TABLET | Freq: Two times a day (BID) | ORAL | Status: DC
  Administered 2016-03-09 – 2016-03-10 (×3): 1 via ORAL
  Filled 2016-03-09 (×3): qty 1

## 2016-03-09 MED ORDER — FUROSEMIDE 40 MG PO TABS
40.0000 mg | ORAL_TABLET | Freq: Every day | ORAL | Status: DC
  Administered 2016-03-10: 40 mg via ORAL
  Filled 2016-03-09: qty 1

## 2016-03-09 NOTE — Progress Notes (Signed)
Patient ID: [name], male   DOB: Aug 15, 1945, 70 y.o.   MRN: [id number]      Advanced Heart Failure Rounding Note  Referring Physician: Dr. Thedore MinsSingh Primary Physician: Cain Saupeammie Fulp Primary Cardiologist:  Dr. Shirlee LatchMcLean   Reason for Consultation: A/C systolic HF  Subjective:    Denies SOB/orthopnea. Desaturates while walking, being set up for home oxygen.   03/05/2016 Echo: EF 30-35%, moderately decreased RV systolic function, stable bioprosthetic mitral valve.   Objective:   Weight Range: 158 lb 1.6 oz (71.7 kg) Body mass index is 28.01 kg/m.   Vital Signs:   Temp:  [97.6 F (36.4 C)-98.4 F (36.9 C)] 97.6 F (36.4 C) (10/02 0412) Pulse Rate:  [59-75] 60 (10/02 0412) Resp:  [18-20] 18 (10/02 0412) BP: (129-145)/(59-67) 145/67 (10/02 0412) SpO2:  [91 %-98 %] 98 % (10/02 0842) Weight:  [158 lb 1.6 oz (71.7 kg)] 158 lb 1.6 oz (71.7 kg) (10/02 0412) Last BM Date:  (unknown poss more than a week)  Weight change: Filed Weights   03/07/16 0525 03/08/16 0405 03/09/16 0412  Weight: 160 lb 14.4 oz (73 kg) 160 lb 8 oz (72.8 kg) 158 lb 1.6 oz (71.7 kg)    Intake/Output:   Intake/Output Summary (Last 24 hours) at 03/09/16 1015 Last data filed at 03/09/16 0900  Gross per 24 hour  Intake              720 ml  Output              650 ml  Net               70 ml     Physical Exam: General:  Elderly and chronically ill appearing. In the chair  HEENT: Normal, poor personal grooming, Bipap in place.  Neck: supple. JVP 6-7 cm. Carotids 2+ bilat; no bruits. No thyromegaly or nodule noted.  Cor: PMI nondisplaced. Regular.  No M/G/R Lungs: RML RLL LLL rhonchi.on 2 liters.  Abdomen: soft, NT, ND, no HSM. No bruits or masses. +BS  Extremities: no cyanosis, clubbing, rash. No edema.  Neuro: alert & orientedx3, cranial nerves grossly intact. moves all 4 extremities w/o difficulty. Affect flat  Telemetry: Pacing with underlying Afib - Rate controlled.  Labs: CBC No results for  input(s): WBC, NEUTROABS, HGB, HCT, MCV, PLT in the last 72 hours. Basic Metabolic Panel  Recent Labs  03/08/16 0515 03/09/16 0511  NA 145 145  K 4.9 4.6  CL 95* 95*  CO2 44* 44*  GLUCOSE 155* 209*  BUN 39* 37*  CREATININE 1.23 1.17  CALCIUM 10.4* 10.0   Liver Function Tests No results for input(s): AST, ALT, ALKPHOS, BILITOT, PROT, ALBUMIN in the last 72 hours. No results for input(s): LIPASE, AMYLASE in the last 72 hours. Cardiac Enzymes No results for input(s): CKTOTAL, CKMB, CKMBINDEX, TROPONINI in the last 72 hours.  BNP: BNP (last 3 results)  Recent Labs  05/22/15 2107 03/02/16 1940 03/07/16 0417  BNP 592.8* 361.9* 183.9*    ProBNP (last 3 results) No results for input(s): PROBNP in the last 8760 hours.   D-Dimer No results for input(s): DDIMER in the last 72 hours. Hemoglobin A1C No results for input(s): HGBA1C in the last 72 hours. Fasting Lipid Panel No results for input(s): CHOL, HDL, LDLCALC, TRIG, CHOLHDL, LDLDIRECT in the last 72 hours. Thyroid Function Tests No results for input(s): TSH, T4TOTAL, T3FREE, THYROIDAB in the last 72 hours.  Invalid input(s): FREET3  Other results:  Imaging/Studies:  Dg Chest 2 View  Result Date: 03/09/2016 CLINICAL DATA:  COPD, atrial fibrillation, CHF. EXAM: CHEST  2 VIEW COMPARISON:  Portable chest x-ray of March 03, 2016 FINDINGS: The lungs are adequately inflated. The interstitial markings have decreased in conspicuity. The cardiac silhouette is mildly enlarged. The pulmonary vascularity is less engorged. The permanent pacemaker defibrillator is in stable position. A prosthetic mitral valve appears stable. Previous CABG. Aortic atherosclerosis. Pleural calcification on the left. Old posterior right rib fractures are present. IMPRESSION: Decreased pulmonary interstitial edema. Stable cardiomegaly. The ICD is in stable position. Aortic atherosclerosis. Electronically Signed   By: David  [place] M.D.   On:  03/09/2016 08:10    Latest Echo  Latest Cath   Medications:     Scheduled Medications: . atorvastatin  40 mg Oral q1800  . carvedilol  6.25 mg Oral BID WC  . dextromethorphan-guaiFENesin  1 tablet Oral BID  . digoxin  0.125 mg Oral QHS  . fenofibrate  54 mg Oral Daily  . folic acid  1 mg Oral Daily  . furosemide  40 mg Oral BID  . insulin aspart  0-15 Units Subcutaneous TID WC  . insulin glargine  5 Units Subcutaneous Daily  . ipratropium-albuterol  3 mL Nebulization QID  . living well with diabetes book   Does not apply Once  . mometasone-formoterol  2 puff Inhalation BID  . multivitamin with minerals  1 tablet Oral Daily  . rivaroxaban  20 mg Oral Q supper  . spironolactone  25 mg Oral QHS  . thiamine  100 mg Oral Daily    Infusions:    PRN Medications: acetaminophen, albuterol, food thickener, nitroGLYCERIN, ondansetron (ZOFRAN) IV, RESOURCE THICKENUP CLEAR   Assessment/Plan   1. Acute on Chronic systolic CHF: ECHO this admission with EF 30-35%, moderately decreased RV systolic function, stable bioprosthetic mitral valve. Mixed ischemic/nonischemic (ETOH) cardiomyopathy. S/p [employer] CRT-D. Diuresed with IV lasix then transitioned to po lasix 40 mg twice a day. Overall diuresed 15 pounds. Volume status stable. Cut back lasix to 40 mg daily with addition of entresto Renal function stable. Continue spironolactone 25 mg daily - Continue Coreg 6.25 mg BID. - Continue digoxin 0.125, level 0.5    - Stop losartan and start entresto 24-26 mg twice a day.   BMET in am.  Pt has ongoing medical and dietary noncompliance. Would be a good candidate for paramedicine. 2. COPD: Acute exacerbation of COPD.  Per primary. Currently on BiPAP, Nebs, Steroids, and abx for AECOPD.  Still rhonchorous. 3. Atrial fibrillation:Chronic. He has had AV nodal ablation with BiV pacing.  - With non-compliance to BID dosing, switched from Eliquis to Xarelto 20 mg. 4. CAD: Denies chest pain.  LHC (3/16) with moderate distal LM stenosis (IVUS 6.9 mm2), 80% ostial LCx, total occlusion RCA with collaterals. Ostial LCx not ideal for PCI, managed medically.  - No ASA given anticoagulation use. - Increase atorvastatin to 40 with LDL > 70.   5. Mitral valve replacement:Bioprosthetic valve currently, originally Bjork-Shiley. Valve stable on echo this admission.  6. Smoking:  Continues to smoke > 1 ppd. Has continually refused cessation.  7. AKI: Resolved.    Length of Stay: 7  Amy Clegg NP-C 03/09/2016, 10:15 AM  Advanced Heart Failure Team Pager [phone] (M-F; 7a - 4p)  Please contact [facility] for night-coverage after hours (4p -7a ) and weekends on [email or web address]   Patient seen with PA, agree with the above note.  Volume status looks ok.  Agree  with decreasing Lasix to 40 mg daily and replacing losartan with Entresto.    Still with desaturation off oxygen.  Will need home O2 in setting of COPD exacerbation.  Lungs still rhonchorous on exam.   Marca Ancona 03/09/2016 11:48 AM

## 2016-03-09 NOTE — Progress Notes (Signed)
SATURATION QUALIFICATIONS: (This note is used to comply with regulatory documentation for home oxygen)  Patient Saturations on Room Air at Rest = 87%  Patient Saturations on Room Air while Ambulating = NT as O2 at rest 87%.  Patient Saturations on 3 Liters of oxygen while Ambulating = 90-94%  Please briefly explain why patient needs home oxygen:Pt needed O2 at rest and with ambulation for sats above 90%.  Thanks.  [facility] [phone] (pager)

## 2016-03-09 NOTE — Progress Notes (Signed)
PROGRESS NOTE    Dominic HESLIN  [name]:[id number] DOB: 1946-04-23 DOA: 03/02/2016 PCP: Cain Saupe, MD     Brief Narrative:  [name] is a 70 y.o.malewith history of COPD, chronic respiratory failure on home oxygen, systolic CHF(EF 20-25% in 05/2015),mixed cardiomyopathy, A. fib on Eliquis,CAD s/p PCI, s/p MVreplacement, CVA, PVD, and HLD who presented with shortness of breath. Patient reported he had stopped taking some of his medications for 2 days because they "made him urinate too much."  Assessment & Plan:   Principal Problem:   Acute respiratory failure with hypoxia and hypercapnia (HCC) Active Problems:   Atrial fibrillation, chronic (HCC)   Hyperlipidemia   Automatic implantable cardioverter-defibrillator in situ   Chronic anticoagulation   Alcohol abuse   Cigarette smoker   Chronic respiratory failure with hypoxia (HCC)   CAD (coronary artery disease)   COPD exacerbation (HCC)   Acute on chronic systolic heart failure (HCC)  Acute on chronic hypoxic/hypercapnic respiratory failure with respiratory acidosis -Secondary to combination of acute on chronic systolic CHF and COPD exacerbation -Requires 2L [place] at baseline  -Off bipap   Acute COPD exacerbation/bronchitis  -5 day tx course rocephin/azithromax  -Received steroids this admission, completed course  -Duoneb, dulera  -Mucinex  -Incentive spirometry and flutter valve per RT   Acute on chronic systolic CHF -EF 95-62%, moderately dec RV systolic function  -Down 13 lbs and >8L  -Lasix daily  -Spironolactone daily -Coreg twice daily -Digoxin daily -Start entresto -Cardiology following, appreciate -Repeat CXR improved   Chronic atrial fibrillation -Hx AV node ablation w/ BiV pacing -Continue coreg, digoxin and Xarelto (eliquis switched to xarelto for once daily dosing)   Mild elevation in troponin -Chest pain-free -Likely due to demand ischemia   Dyslipidemia -Lipitor increased to  40mg  -Continue fenofibrate  S/P mitral valve replacement  -Bioprosthetic valve   DM type 2, new dx  -A1c 7.5. No previous hx of Dm.  -Will start metformin at time of discharge -Lantus 5u daily  -Continue SSI  -Consult diabetic ed   Alcohol abuse -Counseled to quit  -Now off CIWA  -Vit B1, folate daily   DVT prophylaxis: xarelto Code Status: full Family Communication: spoke with son in Social worker, Marita [place] today  Disposition Plan: continue diuresis    Consultants:   Cardiology   Procedures:   TTE - EF 30-35% - unable to comment on diastolic fxn - normal prosthetic mitral valve   Antimicrobials:   Azithromycin 9/25 > 9/29  Ceftriaxone 9/26 > 9/29   Subjective: Patient resting comfortably in chair. He has no complaints. He continues to have a cough that is nonproductive. Denies any chest pain or shortness of breath today.   Objective: Vitals:   03/08/16 2026 03/08/16 2215 03/09/16 0412 03/09/16 0842  BP:   (!) 145/67   Pulse: 62 62 60   Resp: 18  18   Temp:   97.6 F (36.4 C)   TempSrc:   Oral   SpO2: 92%  95% 98%  Weight:   71.7 kg (158 lb 1.6 oz)   Height:        Intake/Output Summary (Last 24 hours) at 03/09/16 1158 Last data filed at 03/09/16 0900  Gross per 24 hour  Intake              720 ml  Output              650 ml  Net  70 ml   Filed Weights   03/07/16 0525 03/08/16 0405 03/09/16 0412  Weight: 73 kg (160 lb 14.4 oz) 72.8 kg (160 lb 8 oz) 71.7 kg (158 lb 1.6 oz)    Examination:  General exam: Appears calm and comfortable  Respiratory system: Coarse breath sounds bilaterally with wheeze, respiratory effort is normal, no conversational dyspnea Cardiovascular system: S1 & S2 heard, irregular rhythm, rate controlled. No JVD, murmurs, rubs, gallops or clicks. No pedal edema. Gastrointestinal system: Abdomen is nondistended, soft and nontender. No organomegaly or masses felt. Normal bowel sounds heard. Central nervous system: Alert. No  focal neurological deficits. Extremities: Symmetric 5 x 5 power. Skin: No rashes, lesions or ulcers Psychiatry: Judgement and insight appear normal. Mood & affect appropriate.   Data Reviewed: I have personally reviewed following labs and imaging studies  CBC:  Recent Labs Lab 03/02/16 1940 03/03/16 0609 03/04/16 0450 03/05/16 0448  WBC 14.9* 11.7* 14.6* 17.0*  NEUTROABS  --  10.9*  --   --   HGB 13.6 13.1 12.7* 13.1  HCT 44.2 43.4 42.3 44.3  MCV 95.3 95.6 96.4 96.3  PLT 153 138* 162 180   Basic Metabolic Panel:  Recent Labs Lab 03/03/16 0609  03/05/16 0448 03/06/16 0237 03/07/16 0417 03/08/16 0515 03/09/16 0511  NA 139  < > 137 140 145 145 145  K 5.0  < > 4.9 4.4 4.8 4.9 4.6  CL 105  < > 96* 96* 99* 95* 95*  CO2 28  < > 35* 37* 39* 44* 44*  GLUCOSE 172*  < > 155* 238* 183* 155* 209*  BUN 19  < > 47* 55* 45* 39* 37*  CREATININE 1.30*  < > 1.45* 1.69* 1.52* 1.23 1.17  CALCIUM 9.2  < > 9.3 9.6 9.8 10.4* 10.0  MG 1.9  --   --   --   --   --   --   < > = values in this interval not displayed. GFR: Estimated Creatinine Clearance: 53.7 mL/min (by C-G formula based on SCr of 1.17 mg/dL). Liver Function Tests:  Recent Labs Lab 03/02/16 1940  AST 38  ALT 34  ALKPHOS 103  BILITOT 1.2  PROT 7.3  ALBUMIN 3.9    Recent Labs Lab 03/02/16 1940  LIPASE 41   No results for input(s): AMMONIA in the last 168 hours. Coagulation Profile:  Recent Labs Lab 03/02/16 1940  INR 1.19   Cardiac Enzymes:  Recent Labs Lab 03/03/16 0139 03/03/16 0609 03/03/16 1116  TROPONINI 0.05* 0.03* 0.03*   BNP (last 3 results) No results for input(s): PROBNP in the last 8760 hours. HbA1C: No results for input(s): HGBA1C in the last 72 hours. CBG:  Recent Labs Lab 03/08/16 1621 03/08/16 2111 03/08/16 2130 03/09/16 0613 03/09/16 1106  GLUCAP 175* 171* 184* 212* 207*   Lipid Profile: No results for input(s): CHOL, HDL, LDLCALC, TRIG, CHOLHDL, LDLDIRECT in the last 72  hours. Thyroid Function Tests: No results for input(s): TSH, T4TOTAL, FREET4, T3FREE, THYROIDAB in the last 72 hours. Anemia Panel: No results for input(s): VITAMINB12, FOLATE, FERRITIN, TIBC, IRON, RETICCTPCT in the last 72 hours. Sepsis Labs: No results for input(s): PROCALCITON, LATICACIDVEN in the last 168 hours.  Recent Results (from the past 240 hour(s))  MRSA PCR Screening     Status: None   Collection Time: 03/03/16  2:34 AM  Result Value Ref Range Status   MRSA by PCR NEGATIVE NEGATIVE Final    Comment:  The GeneXpert MRSA Assay (FDA approved for NASAL specimens only), is one component of a comprehensive MRSA colonization surveillance program. It is not intended to diagnose MRSA infection nor to guide or monitor treatment for MRSA infections.          Radiology Studies: Dg Chest 2 View  Result Date: 03/09/2016 CLINICAL DATA:  COPD, atrial fibrillation, CHF. EXAM: CHEST  2 VIEW COMPARISON:  Portable chest x-ray of March 03, 2016 FINDINGS: The lungs are adequately inflated. The interstitial markings have decreased in conspicuity. The cardiac silhouette is mildly enlarged. The pulmonary vascularity is less engorged. The permanent pacemaker defibrillator is in stable position. A prosthetic mitral valve appears stable. Previous CABG. Aortic atherosclerosis. Pleural calcification on the left. Old posterior right rib fractures are present. IMPRESSION: Decreased pulmonary interstitial edema. Stable cardiomegaly. The ICD is in stable position. Aortic atherosclerosis. Electronically Signed   By: David  SwazilandJordan M.D.   On: 03/09/2016 08:10        Scheduled Meds: . atorvastatin  40 mg Oral q1800  . carvedilol  6.25 mg Oral BID WC  . dextromethorphan-guaiFENesin  1 tablet Oral BID  . digoxin  0.125 mg Oral QHS  . fenofibrate  54 mg Oral Daily  . folic acid  1 mg Oral Daily  . [START ON 03/10/2016] furosemide  40 mg Oral Daily  . insulin aspart  0-15 Units  Subcutaneous TID WC  . insulin glargine  5 Units Subcutaneous Daily  . ipratropium-albuterol  3 mL Nebulization QID  . living well with diabetes book   Does not apply Once  . mometasone-formoterol  2 puff Inhalation BID  . multivitamin with minerals  1 tablet Oral Daily  . rivaroxaban  20 mg Oral Q supper  . sacubitril-valsartan  1 tablet Oral BID  . spironolactone  25 mg Oral QHS  . thiamine  100 mg Oral Daily   Continuous Infusions:    LOS: 7 days    Time spent: 30 minutes    Noralee StainJennifer Kolson Chovanec, DO [name] Pager [phone]  If 7PM-7AM, please contact night-coverage [email or web address] Password [email or web address] 03/09/2016, 11:58 AM

## 2016-03-09 NOTE — Progress Notes (Signed)
Speech Language Pathology Treatment: Dysphagia  Patient Details Name: [name] MRN: [id number] DOB: 1945-07-30 Today's Date: 03/09/2016 Time: 6606-3016 SLP Time Calculation (min) (ACUTE ONLY): 10 min  Assessment / Plan / Recommendation Clinical Impression  F/u for dysphagia: pt sitting in recliner, consuming thin water.  Reviewed necessity of limiting liquids to nectar-thickness at this time.  Continues to cough intermittently with thins; this is prevented with thicker liquids. Deficits appear to be related to impaired ventilatory/swallowing reciprocity; lungs are rhonchorous.  Pt with limited insight/understanding.  Will continue SLP f/u while in acute care.  No family available for education.Marland Kitchen    HPI HPI: 70 y.o. male with  medical history significant of COPD, chronic respiratory failure on home oxygen, systolic CHF last EF 20-25% in 05/2015, mixed cardiomyopathy, A. fib on Eliquis, CAD s/p PCI, s/p  MV replacement, CVA, PVD, HLD; presents with complaints of shortness of breath.  Dx with acute on chronic hypoic/hypercapnic respiratory failure with respiratory acidosis, acute on chronic systolic CHF, mild ARF, and mild elevation in troponin (likely demand ischemia).          SLP Plan  Continue with current plan of care     Recommendations  Diet recommendations: Nectar-thick liquid;Dysphagia 3 (mechanical soft) Liquids provided via: Cup Medication Administration: Whole meds with puree Supervision: Patient able to self feed;Intermittent supervision to cue for compensatory strategies Compensations: Minimize environmental distractions;Slow rate;Small sips/bites;Follow solids with liquid Postural Changes and/or Swallow Maneuvers: Seated upright 90 degrees;Upright 30-60 min after meal                Oral Care Recommendations: Oral care BID Plan: Continue with current plan of care       GO                Blenda Mounts Laurice 03/09/2016, 4:48 PM

## 2016-03-09 NOTE — Progress Notes (Signed)
Occupational Therapy Treatment Patient Details Name: [name] MRN: [id number] DOB: 05-Sep-1945 Today's Date: 03/09/2016    History of present illness 70 y.o. male with  medical history significant of COPD, chronic respiratory failure on home oxygen, systolic CHF last EF 20-25% in 05/2015, mixed cardiomyopathy, A. fib on Eliquis, CAD s/p PCI, s/p  MV replacement, CVA, PVD, HLD; presents with complaints of shortness of breath.  Dx with acute on chronic hypoic/hypercapnic respiratory failure with respiratory acidosis, acute on chronic systolic CHF, mild ARF, and mild elevation in troponin (likely demand ischemia).       OT comments  This 70 yo male admitted with above presents to acute OT with making progress with basic ADLs. He could not remember the breathing techniques taught to him and his son last OT session--I reiterated purse lipped breathing and he returned demonstrated. The recommendation for home is that he has 24/7 S with prn A if he does not have this then will need SNF.  Follow Up Recommendations  No OT follow up;Supervision/Assistance - 24 hour    Equipment Recommendations  Tub/shower seat       Precautions / Restrictions Precautions Precautions: Fall Precaution Comments: monitor O2 sats with activity Restrictions Weight Bearing Restrictions: No       Mobility Bed Mobility Overal bed mobility: Needs Assistance Bed Mobility: Supine to Sit     Supine to sit: Supervision;HOB elevated     General bed mobility comments: Pt up in recliner upon arrival  Transfers Overall transfer level: Needs assistance Equipment used: Rolling walker (2 wheeled) Transfers: Sit to/from Stand Sit to Stand: Min guard       General transfer comment: Min guard for safety and balance    Balance Overall balance assessment: Needs assistance;History of Falls Sitting-balance support: No upper extremity supported;Feet supported Sitting balance-Leahy Scale: Normal     Standing  balance support: Bilateral upper extremity supported;During functional activity Standing balance-Leahy Scale: Poor Standing balance comment: reliant on RW                   ADL Overall ADL's : Needs assistance/impaired     Grooming: Wash/dry hands;Wash/dry face;Brushing hair;Min Production designer, theatre/television/film: Min guard;Ambulation;RW;Regular Social worker and Hygiene: Min guard;Sit to/from stand                       Cognition   Behavior During Therapy: [facility] for tasks assessed/performed Overall Cognitive Status: No family/caregiver present to determine baseline cognitive functioning Area of Impairment: Safety/judgement          Safety/Judgement: Decreased awareness of safety     General Comments: puts walker off to the side when coming up to the sink                 Pertinent Vitals/ Pain       Pain Assessment: No/denies pain         Frequency  Min 2X/week        Progress Toward Goals    Progress towards OT goals: Progressing toward goals    Plan Discharge plan remains appropriate       End of Session Equipment Utilized During Treatment: Oxygen (3 liters)   Activity Tolerance Patient tolerated treatment well   Patient Left in chair;with call bell/phone within reach;with chair alarm set   Nurse Communication  (NT: pt had a  bowel movement)        Time: [id number] OT Time Calculation (min): 15 min  Charges: OT General Charges $OT Visit: 1 Procedure OT Treatments $Self Care/Home Management : 8-22 mins  Evette GeorgesLeonard, Tiffny Gemmer Eva [phone] 03/09/2016, 2:48 PM

## 2016-03-09 NOTE — Progress Notes (Signed)
  RD consulted for nutrition education regarding diabetes.   Lab Results  Component Value Date   HGBA1C 7.5 (H) 03/06/2016    RD provided "Carbohydrate Counting for People with Diabetes" handout from the Academy of Nutrition and Dietetics. Discussed different food groups and their effects on blood sugar, emphasizing carbohydrate-containing foods. Provided list of carbohydrates and recommended serving sizes of common foods.  Discussed importance of controlled and consistent carbohydrate intake throughout the day. Provided examples of ways to balance meals/snacks and encouraged intake of high-fiber, whole grain complex carbohydrates. Encouraged patient to consume lean protein and vegetables with every meal. Teach back method used. Patient states that he was drinking 2 large glasses of sweet tea daily PTA and expresses plan to switch to unsweet tea. Pt was unable to voice any additional changes that he plans to male.   Expect fair compliance.  Body mass index is 28.01 kg/m. Pt meets criteria for Overweight based on current BMI.  Current diet order is Dysphagia 3 with nectar-thick liquids, patient is consuming approximately 100% of meals at this time. Labs and medications reviewed. No further nutrition interventions warranted at this time. RD contact information provided. If additional nutrition issues arise, please re-consult RD.  Dorothea Ogle RD, CSP, LDN Inpatient Clinical Dietitian Pager: [phone] After Hours Pager: [phone]

## 2016-03-09 NOTE — Care Management Important Message (Signed)
Important Message  Patient Details  Name: [name] MRN: [id number] Date of Birth: 03/21/1946   Medicare Important Message Given:  Yes    Dorena Bodo 03/09/2016, 12:55 PM

## 2016-03-09 NOTE — Progress Notes (Addendum)
Inpatient Diabetes Program Recommendations  AACE/ADA: New Consensus Statement on Inpatient Glycemic Control (2015)  Target Ranges:  Prepandial:   less than 140 mg/dL      Peak postprandial:   less than 180 mg/dL (1-2 hours)      Critically ill patients:  140 - 180 mg/dL   Results for ORLAND, CARIKER (MRN [id number]) as of 03/09/2016 15:21  Ref. Range 03/06/2016 02:37  Hemoglobin A1C Latest Ref Range: 4.8 - 5.6 % 7.5 (H)   Results for TAKI, CURRIER (MRN [id number]) as of 03/09/2016 15:31  Ref. Range 03/09/2016 06:13 03/09/2016 11:06  Glucose-Capillary Latest Ref Range: 65 - 99 mg/dL 053 (H) 976 (H)     New diagnosis of DM this admission.  Educational materials ordered for pt, however, upon assessment, I believe family will need to learn the bulk of the diabetes survival skills concepts as patient seems mildly confused.    Spoke w/ pt's daughter today by phone (she is currently an admitted patient on unit 6N).  Explained to Ms. Margo Aye (daughter) that patient has been diagnosed with diabetes and that his caretaker will need to learn how to check pt's CBGs, administer medications, and provide nutritionally appropriate meals at home for pt.  Pt's daughter told me pt's son Deniece Portela) will be coming to live with pt to help take care of him.  Have asked RNs to provide basic DM survival skills education to family.  Will attempt to either speak with pt's son either in person or on the phone tomorrow.     --Will follow patient during hospitalization--  Ambrose Finland RN, MSN, CDE Diabetes Coordinator Inpatient Glycemic Control Team Team Pager: [phone] (8a-5p)

## 2016-03-09 NOTE — ED Provider Notes (Signed)
[facility]-EMERGENCY DEPT Provider Note  CSN: [id number] Arrival Date & Time: 03/02/16 @ 1821  History    Chief Complaint Chief Complaint  Patient presents with  . Nausea  . Emesis    HPI [name] is a 70 y.o. male.  Patient presents emergency department for shortness of breath patient attributes to stopping medications for the past 2 days.  Endorses urinary frequency with Lasix.  Patient also endorses he has not seen cardiology in sometime. Patient endorses intermittent chest pain over the last 1 month.  Patient also endorses moderate leg swelling with cough wheezing and shortness of breath which he states is related to exertion.  Denies fevers or chills abdominal pain back. Emesis is NBNB.  Past Medical & Surgical History    Past Medical History:  Diagnosis Date  . AICD (automatic cardioverter/defibrillator) present   . Alcohol abuse   . Anxiety   . Bipolar disorder (HCC)   . Biventricular automatic implantable cardioverter defibrillator in situ    a. 01/2014 s/p MDT [id number] Viva XT CRT-D (ser # [id number]).  . CAD (coronary artery disease)    a. s/p prior PCI/stenting of the LCX;  b. 08/2014 MV: EF 20%, large septal, apical, and inferior infarct from apex to base, no ischemia;  c. 08/2014 NSTEMI/Cath: LM mod distal dzs extending into ostial LCX (80%), LAD tortuous, RI nl, OM mod dzs, RCA 100 CTO with R->R and L->R collats-->Med Rx.  . Chronic atrial fibrillation (HCC)    a. CHA2DS2VASc = 6->eliquis;  b. S/P AVN RFCA an BiV ICD placement.  . Chronic respiratory failure (HCC)   . Chronic systolic congestive heart failure (HCC)    a. 01/2014 Echo: EF 25-30%, mod conc LVH, mod dil LA.  . CKD (chronic kidney disease)    a. suspect stage II-III based on historical labs.  . Complete heart block (HCC)    a. In setting of prior AV nodal ablation r/t afib-->BiV ICD (01/2014).  . COPD (chronic obstructive pulmonary disease) (HCC)   . CVA (cerebral vascular accident) (HCC)    a. Multiple  prior embolic strokes.  . Dementia   . Depression   . DVT (deep venous thrombosis) (HCC)   . Dyslipidemia   . Hyperglycemia   . Hypertension   . Mitral valve disease    a. remote mitral replacement with Bjork Shiley valve;  b. 06/2006 Redo MVR with tissue valve.  . Mixed Ischemic and Nonischemic Cardiomyopathy    a. 8/.2015 Echo: Ef 25-30%;  b. 01/2014 s/p MDT [id number] Talbot Grumbling XT CRT-D (ser # [id number]).  . On home oxygen therapy    "3L at night" (11/23/2015)  . Ventral hernia    Patient Active Problem List   Diagnosis Date Noted  . Acute on chronic systolic heart failure (HCC)   . Acute respiratory failure with hypoxia and hypercapnia (HCC)   . COPD exacerbation (HCC) 03/02/2016  . Chronic kidney disease (CKD), stage II (mild) 11/23/2015  . CAD (coronary artery disease)   . Dementia   . Chronic respiratory failure with hypoxia (HCC) 10/11/2015  . Chronic systolic CHF (congestive heart failure) (HCC) 05/30/2015  . Cigarette smoker 12/24/2014  . Stroke (HCC)   . Mitral valve disease   . Depression   . Mixed Ischemic and Nonischemic Cardiomyopathy   . Alcohol abuse   . Chronic anticoagulation 08/22/2014  . PVD (peripheral vascular disease) (HCC) 02/28/2014  . Mural thrombus of heart 02/07/2014  . Hypertension 06/14/2012  . Automatic implantable cardioverter-defibrillator in situ 04/04/2012  .  Hyperlipidemia 10/27/2011  . Ventricular tachycardia (HCC) 07/14/2011  . Atrial fibrillation, chronic (HCC) 07/13/2011  . Right atrial thrombus (HCC) 07/11/2011   Past Surgical History:  Procedure Laterality Date  . AV NODE ABLATION  2007  . CARDIAC CATHETERIZATION  06/2006   Mild ostial L main stenosis, CFX stent patent, RCA occluded (old)  . CORONARY ARTERY BYPASS GRAFT  2008  . CYSTOSCOPY W/ URETERAL STENT PLACEMENT  07/12/2011   Procedure: CYSTOSCOPY WITH RETROGRADE PYELOGRAM/URETERAL STENT PLACEMENT;  Surgeon: Sebastian Ache, MD;  Location: [facility] OR;  Service: Urology;  Laterality: Left;  .  HERNIA REPAIR    . ICD GENERATOR CHANGE  02/01/2014   Gen change to: Medtronic VIVA pulse generator, serial number [id number]  . INSERT / REPLACE / REMOVE PACEMAKER    . LEFT HEART CATHETERIZATION WITH CORONARY ANGIOGRAM N/A 08/23/2014   Procedure: LEFT HEART CATHETERIZATION WITH CORONARY ANGIOGRAM;  Surgeon: Corky Crafts, MD;  Location: [facility] CATH LAB;  Service: Cardiovascular;  Laterality: N/A;  . MITRAL VALVE REPLACEMENT     remote [facility] valve 4098 with redo tissue valve 06/2006  . PACEMAKER GENERATOR CHANGE N/A 02/01/2014   Procedure: PACEMAKER GENERATOR CHANGE;  Surgeon: Duke Salvia, MD;  Location: [facility] CATH LAB;  Service: Cardiovascular;  Laterality: N/A;  . PACEMAKER PLACEMENT  2006   Changed to CRT-D in 2009    Family & Social History    Family History  Problem Relation Age of Onset  . Alzheimer's disease Mother   . Heart attack Father   . Heart disease Father   . Heart attack Son   . Varicose Veins Son   . Deep vein thrombosis Son   . Heart disease Son   . Stroke    . Heart disease    . Heart disease Brother    Social History  Substance Use Topics  . Smoking status: Current Every Day Smoker    Packs/day: 1.00    Years: 42.00    Types: Cigarettes  . Smokeless tobacco: Never Used  . Alcohol use 0.0 oz/week     Comment: 11/22/2015 "6 pack of beer/month"    Home Medications    Prior to Admission medications   Medication Sig Start Date End Date Taking? Authorizing Provider  albuterol (PROVENTIL HFA;VENTOLIN HFA) 108 (90 Base) MCG/ACT inhaler Inhale 2 puffs into the lungs every 4 (four) hours as needed for wheezing or shortness of breath.   Yes Historical Provider, MD  apixaban (ELIQUIS) 5 MG TABS tablet Take 1 tablet (5 mg total) by mouth 2 (two) times daily. Patient taking differently: Take 5 mg by mouth every morning.  10/23/15  Yes Graciella Freer, PA-C  atorvastatin (LIPITOR) 20 MG tablet Take 1 tablet (20 mg total) by mouth daily. 10/23/15  Yes  Graciella Freer, PA-C  carvedilol (COREG) 6.25 MG tablet Take 1 tablet (6.25 mg total) by mouth 2 (two) times daily with a meal. Patient taking differently: Take 12.5 mg by mouth at bedtime.  10/23/15  Yes Graciella Freer, PA-C  digoxin (LANOXIN) 0.125 MG tablet Take 1 tablet (0.125 mg total) by mouth daily. Patient taking differently: Take 0.125 mg by mouth at bedtime.  10/23/15  Yes Graciella Freer, PA-C  fenofibrate (TRICOR) 48 MG tablet Take 1 tablet (48 mg total) by mouth daily. Reported on 05/28/2015 10/23/15  Yes Graciella Freer, PA-C  furosemide (LASIX) 20 MG tablet Take 2 tablets (40 mg total) by mouth 2 (two) times daily. 10/23/15  Yes Mariam Dollar  Tillery, PA-C  loratadine (CLARITIN) 10 MG tablet Take 10 mg by mouth daily as needed for allergies.   Yes Historical Provider, MD  losartan (COZAAR) 50 MG tablet Take 1 tablet (50 mg total) by mouth daily. Patient taking differently: Take 50 mg by mouth at bedtime.  11/23/15  Yes Dayna N Dunn, PA-C  Multiple Vitamin (MULTIVITAMIN WITH MINERALS) TABS tablet Take 1 tablet by mouth daily. 05/24/15  Yes Osvaldo Shipper, MD  phenylephrine (SUDAFED PE) 10 MG TABS tablet Take 10 mg by mouth every 6 (six) hours as needed (for cold symptoms).   Yes Historical Provider, MD  ranitidine (ZANTAC) 150 MG tablet Take 150 mg by mouth daily as needed for heartburn.   Yes Historical Provider, MD  spironolactone (ALDACTONE) 25 MG tablet Take 1 tablet (25 mg total) by mouth daily. Patient taking differently: Take 25 mg by mouth at bedtime.  10/23/15  Yes Graciella Freer, PA-C  thiamine 100 MG tablet Take 1 tablet (100 mg total) by mouth daily. 05/24/15  Yes Osvaldo Shipper, MD  isosorbide mononitrate (IMDUR) 30 MG 24 hr tablet Take 1 tablet (30 mg total) by mouth daily. Patient not taking: Reported on 03/02/2016 11/23/15   Dayna N Dunn, PA-C  nitroGLYCERIN (NITROSTAT) 0.4 MG SL tablet Place 1 tablet (0.4 mg total) under the tongue every  5 (five) minutes as needed for chest pain. 01/06/15   [name], PA-C    Allergies    Warfarin  I reviewed & agree with nursing's documentation on the patient's past medical, surgical, social & family histories as well as their allergies.  Review of Systems  Complete ROS obtained, and is negative except as stated in HPI.   Physical Exam  Updated Vital Signs BP 126/62 (BP Location: Right Arm)   Pulse (!) 59   Temp 98 F (36.7 C) (Oral)   Resp 18   Ht 5\' 3"  (1.6 m)   Wt 71.7 kg   SpO2 93%   BMI 28.01 kg/m  I have reviewed the triage vital signs and the nursing notes. Physical Exam CONST: Patient alert, well appearing, in no apparent distress.  EYES: PERRLA. EOMI. Conjunctiva w/o d/c. Lids AT w/o swelling.  ENMT: External Nares & Ears AT w/o swelling. Oropharynx patent. MM moist.  NECK: ROM full w/o rigidity. Trachea midline. JVD absent.  CVS: +S1/S2 w/o obvious murmur. Lower extremities w/ pitting edema.  RESP: Respiratory effort mildly labored w/o retractions or accessory muscle use. BS w/ faint wheezing w/o rales or rhonchi.  GI: Soft & ND. +BS x 4. TTP absent. Umbilical hernia reducible. Guarding & Rebound absent.  BACK: CVA TTP absent bilaterally.  SKIN: Skin warm & dry. Turgor good. No rash.  PSYCH: Alert. Oriented. Affect and mood appropriate.  NEURO: CN II-XII grossly intact. Motor exam symmetric w/ upper & lower extremities 5/5 bilaterally. Sensation grossly intact.  MSK: Joints located & stable, w/o obvious dislocation & obvious deformity or crepitus absent w/ Cap refill < 2 sec. Peripheral pulses 2+ & equal in all extremities.    ED Treatments & Results   Labs (only abnormal results are displayed) Labs Reviewed  COMPREHENSIVE METABOLIC PANEL - Abnormal; Notable for the following:       Result Value   Glucose, Bld 148 (*)    All other components within normal limits  CBC - Abnormal; Notable for the following:    WBC 14.9 (*)    All other components  within normal limits  BRAIN NATRIURETIC PEPTIDE - Abnormal;  Notable for the following:    B Natriuretic Peptide 361.9 (*)    All other components within normal limits  BASIC METABOLIC PANEL - Abnormal; Notable for the following:    Glucose, Bld 172 (*)    Creatinine, Ser 1.30 (*)    GFR calc non Af Amer 55 (*)    All other components within normal limits  CBC WITH DIFFERENTIAL/PLATELET - Abnormal; Notable for the following:    WBC 11.7 (*)    Platelets 138 (*)    Neutro Abs 10.9 (*)    Lymphs Abs 0.6 (*)    All other components within normal limits  TROPONIN I - Abnormal; Notable for the following:    Troponin I 0.05 (*)    All other components within normal limits  TROPONIN I - Abnormal; Notable for the following:    Troponin I 0.03 (*)    All other components within normal limits  TROPONIN I - Abnormal; Notable for the following:    Troponin I 0.03 (*)    All other components within normal limits  GLUCOSE, CAPILLARY - Abnormal; Notable for the following:    Glucose-Capillary 254 (*)    All other components within normal limits  BASIC METABOLIC PANEL - Abnormal; Notable for the following:    Chloride 97 (*)    Glucose, Bld 169 (*)    BUN 31 (*)    Creatinine, Ser 1.35 (*)    GFR calc non Af Amer 52 (*)    All other components within normal limits  CBC - Abnormal; Notable for the following:    WBC 14.6 (*)    Hemoglobin 12.7 (*)    All other components within normal limits  BLOOD GAS, ARTERIAL - Abnormal; Notable for the following:    pH, Arterial 7.251 (*)    pCO2 arterial 78.3 (*)    Bicarbonate 33.2 (*)    Acid-Base Excess 6.3 (*)    All other components within normal limits  GLUCOSE, CAPILLARY - Abnormal; Notable for the following:    Glucose-Capillary 209 (*)    All other components within normal limits  GLUCOSE, CAPILLARY - Abnormal; Notable for the following:    Glucose-Capillary 185 (*)    All other components within normal limits  GLUCOSE, CAPILLARY -  Abnormal; Notable for the following:    Glucose-Capillary 161 (*)    All other components within normal limits  DIGOXIN LEVEL - Abnormal; Notable for the following:    Digoxin Level 0.5 (*)    All other components within normal limits  BASIC METABOLIC PANEL - Abnormal; Notable for the following:    Chloride 96 (*)    CO2 35 (*)    Glucose, Bld 155 (*)    BUN 47 (*)    Creatinine, Ser 1.45 (*)    GFR calc non Af Amer 48 (*)    GFR calc Af Amer 56 (*)    All other components within normal limits  CBC - Abnormal; Notable for the following:    WBC 17.0 (*)    MCHC 29.6 (*)    All other components within normal limits  GLUCOSE, CAPILLARY - Abnormal; Notable for the following:    Glucose-Capillary 143 (*)    All other components within normal limits  GLUCOSE, CAPILLARY - Abnormal; Notable for the following:    Glucose-Capillary 185 (*)    All other components within normal limits  GLUCOSE, CAPILLARY - Abnormal; Notable for the following:    Glucose-Capillary 203 (*)  All other components within normal limits  GLUCOSE, CAPILLARY - Abnormal; Notable for the following:    Glucose-Capillary 320 (*)    All other components within normal limits  GLUCOSE, CAPILLARY - Abnormal; Notable for the following:    Glucose-Capillary 237 (*)    All other components within normal limits  BASIC METABOLIC PANEL - Abnormal; Notable for the following:    Chloride 96 (*)    CO2 37 (*)    Glucose, Bld 238 (*)    BUN 55 (*)    Creatinine, Ser 1.69 (*)    GFR calc non Af Amer 40 (*)    GFR calc Af Amer 46 (*)    All other components within normal limits  LIPID PANEL - Abnormal; Notable for the following:    Triglycerides 155 (*)    HDL 37 (*)    LDL Cholesterol 107 (*)    All other components within normal limits  HEMOGLOBIN A1C - Abnormal; Notable for the following:    Hgb A1c MFr Bld 7.5 (*)    All other components within normal limits  GLUCOSE, CAPILLARY - Abnormal; Notable for the following:     Glucose-Capillary 293 (*)    All other components within normal limits  GLUCOSE, CAPILLARY - Abnormal; Notable for the following:    Glucose-Capillary 312 (*)    All other components within normal limits  GLUCOSE, CAPILLARY - Abnormal; Notable for the following:    Glucose-Capillary 194 (*)    All other components within normal limits  GLUCOSE, CAPILLARY - Abnormal; Notable for the following:    Glucose-Capillary 108 (*)    All other components within normal limits  GLUCOSE, CAPILLARY - Abnormal; Notable for the following:    Glucose-Capillary 227 (*)    All other components within normal limits  GLUCOSE, CAPILLARY - Abnormal; Notable for the following:    Glucose-Capillary 257 (*)    All other components within normal limits  BASIC METABOLIC PANEL - Abnormal; Notable for the following:    Chloride 99 (*)    CO2 39 (*)    Glucose, Bld 183 (*)    BUN 45 (*)    Creatinine, Ser 1.52 (*)    GFR calc non Af Amer 45 (*)    GFR calc Af Amer 53 (*)    All other components within normal limits  BRAIN NATRIURETIC PEPTIDE - Abnormal; Notable for the following:    B Natriuretic Peptide 183.9 (*)    All other components within normal limits  GLUCOSE, CAPILLARY - Abnormal; Notable for the following:    Glucose-Capillary 153 (*)    All other components within normal limits  GLUCOSE, CAPILLARY - Abnormal; Notable for the following:    Glucose-Capillary 146 (*)    All other components within normal limits  GLUCOSE, CAPILLARY - Abnormal; Notable for the following:    Glucose-Capillary 175 (*)    All other components within normal limits  GLUCOSE, CAPILLARY - Abnormal; Notable for the following:    Glucose-Capillary 206 (*)    All other components within normal limits  BASIC METABOLIC PANEL - Abnormal; Notable for the following:    Chloride 95 (*)    CO2 44 (*)    Glucose, Bld 155 (*)    BUN 39 (*)    Calcium 10.4 (*)    GFR calc non Af Amer 59 (*)    All other components within  normal limits  GLUCOSE, CAPILLARY - Abnormal; Notable for the following:    Glucose-Capillary  162 (*)    All other components within normal limits  GLUCOSE, CAPILLARY - Abnormal; Notable for the following:    Glucose-Capillary 153 (*)    All other components within normal limits  GLUCOSE, CAPILLARY - Abnormal; Notable for the following:    Glucose-Capillary 186 (*)    All other components within normal limits  GLUCOSE, CAPILLARY - Abnormal; Notable for the following:    Glucose-Capillary 175 (*)    All other components within normal limits  BASIC METABOLIC PANEL - Abnormal; Notable for the following:    Chloride 95 (*)    CO2 44 (*)    Glucose, Bld 209 (*)    BUN 37 (*)    All other components within normal limits  GLUCOSE, CAPILLARY - Abnormal; Notable for the following:    Glucose-Capillary 171 (*)    All other components within normal limits  GLUCOSE, CAPILLARY - Abnormal; Notable for the following:    Glucose-Capillary 184 (*)    All other components within normal limits  GLUCOSE, CAPILLARY - Abnormal; Notable for the following:    Glucose-Capillary 212 (*)    All other components within normal limits  GLUCOSE, CAPILLARY - Abnormal; Notable for the following:    Glucose-Capillary 207 (*)    All other components within normal limits  GLUCOSE, CAPILLARY - Abnormal; Notable for the following:    Glucose-Capillary 204 (*)    All other components within normal limits  I-STAT ARTERIAL BLOOD GAS, ED - Abnormal; Notable for the following:    pH, Arterial 7.222 (*)    pCO2 arterial 71.3 (*)    Bicarbonate 29.2 (*)    All other components within normal limits  MRSA PCR SCREENING  LIPASE, BLOOD  PROTIME-INR  APTT  MAGNESIUM  BASIC METABOLIC PANEL  I-STAT TROPOININ, ED    EKG    EKG Interpretation  Date/Time:  Monday March 02 2016 18:28:59 EDT Ventricular Rate:  63 PR Interval:    QRS Duration: 156 QT Interval:  430 QTC Calculation: 441 R Axis:   -127 Text  Interpretation:  Sinus or ectopic atrial rhythm Prolonged PR interval Nonspecific intraventricular conduction delay Minimal ST depression No significant change since last tracing Confirmed by LITTLE MD, RACHEL ([id number]) on 03/02/2016 6:49:00 PM Also confirmed by LITTLE MD, RACHEL [phone]), editor BREWER, CCT, GLENN ([id number])  on 03/03/2016 8:24:00 AM       Radiology Dg Chest 2 View  Result Date: 03/09/2016 CLINICAL DATA:  COPD, atrial fibrillation, CHF. EXAM: CHEST  2 VIEW COMPARISON:  Portable chest x-ray of March 03, 2016 FINDINGS: The lungs are adequately inflated. The interstitial markings have decreased in conspicuity. The cardiac silhouette is mildly enlarged. The pulmonary vascularity is less engorged. The permanent pacemaker defibrillator is in stable position. A prosthetic mitral valve appears stable. Previous CABG. Aortic atherosclerosis. Pleural calcification on the left. Old posterior right rib fractures are present. IMPRESSION: Decreased pulmonary interstitial edema. Stable cardiomegaly. The ICD is in stable position. Aortic atherosclerosis. Electronically Signed   By: David  [place] M.D.   On: 03/09/2016 08:10    Pertinent labs & imaging results that were available during my care of the patient were independently visualized by me and considered in my medical decision making, please see chart for details.  Procedures (including critical care time) Procedures  Medications Ordered in ED Medications  carvedilol (COREG) tablet 6.25 mg (6.25 mg Oral Given 03/09/16 1701)  digoxin (LANOXIN) tablet 0.125 mg (0.125 mg Oral Given 03/08/16 2215)  fenofibrate tablet 54 mg (54  mg Oral Given 03/09/16 1000)  nitroGLYCERIN (NITROSTAT) SL tablet 0.4 mg (not administered)  multivitamin with minerals tablet 1 tablet (1 tablet Oral Given 03/09/16 1000)  spironolactone (ALDACTONE) tablet 25 mg (25 mg Oral Given 03/08/16 2215)  acetaminophen (TYLENOL) tablet 650 mg (not administered)  ondansetron (ZOFRAN)  injection 4 mg (not administered)  LORazepam (ATIVAN) tablet 1 mg (not administered)    Or  LORazepam (ATIVAN) injection 1 mg (not administered)  folic acid (FOLVITE) tablet 1 mg (1 mg Oral Given 03/09/16 1000)  thiamine (VITAMIN B-1) tablet 100 mg (100 mg Oral Given 03/09/16 1000)  rivaroxaban (XARELTO) tablet 20 mg (20 mg Oral Given 03/09/16 1701)  dextromethorphan-guaiFENesin (MUCINEX DM) 30-600 MG per 12 hr tablet 1 tablet (1 tablet Oral Given 03/09/16 1000)  insulin glargine (LANTUS) injection 5 Units (5 Units Subcutaneous Given 03/09/16 1001)  RESOURCE THICKENUP CLEAR (not administered)  atorvastatin (LIPITOR) tablet 40 mg (40 mg Oral Given 03/09/16 1701)  food thickener (THICK IT) powder (not administered)  insulin aspart (novoLOG) injection 0-15 Units (5 Units Subcutaneous Given 03/09/16 1701)  mometasone-formoterol (DULERA) 200-5 MCG/ACT inhaler 2 puff (2 puffs Inhalation Not Given 03/09/16 0906)  living well with diabetes book MISC (not administered)  ipratropium-albuterol (DUONEB) 0.5-2.5 (3) MG/3ML nebulizer solution 3 mL (3 mLs Nebulization Given 03/09/16 1935)  albuterol (PROVENTIL) (2.5 MG/3ML) 0.083% nebulizer solution 2.5 mg (not administered)  furosemide (LASIX) tablet 40 mg (not administered)  sacubitril-valsartan (ENTRESTO) 24-26 mg per tablet (1 tablet Oral Given 03/09/16 1134)  predniSONE (DELTASONE) tablet 60 mg (60 mg Oral Given 03/02/16 1957)  aspirin chewable tablet 324 mg (324 mg Oral Given 03/02/16 1957)  furosemide (LASIX) injection 40 mg (40 mg Intravenous Given 03/02/16 1957)  azithromycin (ZITHROMAX) 500 mg in dextrose 5 % 250 mL IVPB (500 mg Intravenous New Bag/Given 03/02/16 2130)  ipratropium (ATROVENT) nebulizer solution 1.5 mg (1.5 mg Nebulization Given 03/02/16 2120)  furosemide (LASIX) injection 40 mg (40 mg Intravenous Given 03/03/16 1430)  insulin aspart (novoLOG) injection 3 Units (3 Units Subcutaneous Given 03/03/16 2138)  Influenza vac split quadrivalent PF  (FLUARIX) injection 0.5 mL (0.5 mLs Intramuscular Given 03/06/16 0937)  pneumococcal 23 valent vaccine (PNU-IMMUNE) injection 0.5 mL (0.5 mLs Intramuscular Given 03/06/16 0938)  furosemide (LASIX) injection 80 mg (80 mg Intravenous Given 03/05/16 1822)    Initial Impression & Plan / ED Course & Results / Final Disposition   Initial Impression & Plan Patient arrives for assessment of shortness of breath worsened over the past week.  Given patient's noncompliance with Lasix and exam shows concern for volume overload consideration had at this time congestion heart failure. ECG unremarkable acute STEMI. Given no CP and per ECG have considered and do not suspect ACS. Patient is afebrile and do not suspect sepsis or other serious infection as patient has no cough. chest x-ray also obtained and reviewed and I appreciate no focal opacity.  Patient continues albuterol Solu-Medrol and intravenous azithromycin given the significant degree of hypoxia on nasal cannula and therefore patient placed on BiPAP.  ABG revealed respiratory acidosis w/o concerning degree of hypoxia, therefore believe presentation is consistent with COPD exacerbation superimposed on medication noncompliance therefore provided with intravenous Lasix. Doubt PE as would be more hypoxic. Labs otherwise unremarkable. Leukocytosis likely from steroids.  Final Disposition Reassessment of the patient reveals markedly improved on Bilevel therefore will keep on upon admission given significant hypercapnia.  In light of above & the complexity of the patient's medical condition, I believe the patient will require admission  for continued medical intervention. ED Course in its entirety was reviewed w/ the patient. I therefore consulted the Hospitalist Service for admission & we discussed the patient's ED course & they have agreed to admit. They request no further interventions prior to the patient's transport from the ED. Patient stable for transport. Level  of Care determined by the Admitting Service.   Final Clinical Impression & ED Diagnoses   1. Non-intractable vomiting with nausea, vomiting of unspecified type   2. Acute on chronic congestive heart failure, unspecified congestive heart failure type (HCC)   3. COPD exacerbation (HCC)   4. SOB (shortness of breath)   5. Restlessness   6. Decreased activity tolerance   7. COPD (chronic obstructive pulmonary disease) [facility])    Patient care discussed with the attending physician, Dr. Clarene Duke, who oversaw their evaluation & treatment & voiced agreement.  Note: This document was prepared using Dragon voice recognition software and may include unintentional dictation errors.  House Officer: Jonette Eva, MD, Emergency Medicine Resident.   Jonette Eva, MD 03/09/16 2006    Jonette Eva, MD 03/09/16 2007    Laurence Spates, MD 03/10/16 0930

## 2016-03-09 NOTE — Progress Notes (Signed)
Physical Therapy Treatment Patient Details Name: [name] MRN: [id number] DOB: February 20, 1946 Today's Date: 03/09/2016    History of Present Illness 70 y.o. male with  medical history significant of COPD, chronic respiratory failure on home oxygen, systolic CHF last EF 20-25% in 05/2015, mixed cardiomyopathy, A. fib on Eliquis, CAD s/p PCI, s/p  MV replacement, CVA, PVD, HLD; presents with complaints of shortness of breath.  Dx with acute on chronic hypoic/hypercapnic respiratory failure with respiratory acidosis, acute on chronic systolic CHF, mild ARF, and mild elevation in troponin (likely demand ischemia).        PT Comments    Pt admitted with above diagnosis. Pt currently with functional limitations due to balance and endurance deficits. Pt with questionable cognitive deficits as pt was lying in urine and did not call to be cleaned. Awareness of safety questioned.  Feel that son will need to provide 24 hour care if pt goes home.  Pt was able to ambulate with RW in hallway with min guard assist.   If son cannot provide 24 hour care, may need SNF. Pt will benefit from skilled PT to increase their independence and safety with mobility to allow discharge to the venue listed below.    Follow Up Recommendations  Home health PT;Supervision/Assistance - 24 hour     Equipment Recommendations  Other (comment) (increased home O2 use? at baseline only uses at night)    Recommendations for Other Services       Precautions / Restrictions Precautions Precautions: Fall Precaution Comments: monitor O2 sats with activity Restrictions Weight Bearing Restrictions: No    Mobility  Bed Mobility Overal bed mobility: Needs Assistance Bed Mobility: Supine to Sit     Supine to sit: Supervision;HOB elevated     General bed mobility comments: supervision for safety, pt using railing to get to EOB.   Transfers Overall transfer level: Needs assistance Equipment used: Rolling walker (2  wheeled) Transfers: Sit to/from Stand Sit to Stand: Min guard         General transfer comment: Min guard for safety and balance  Ambulation/Gait Ambulation/Gait assistance: Min guard Ambulation Distance (Feet): 285 Feet Assistive device: Rolling walker (2 wheeled) Gait Pattern/deviations: Step-through pattern;Decreased stride length;Trunk flexed Gait velocity: decreased Gait velocity interpretation: Below normal speed for age/gender General Gait Details: min guard assist for safety and balance with RW during gait.  No LOB without challenges.  O2 sats down to 87% on RA at rest therefore replaced O2 for rest of session.     Stairs            Wheelchair Mobility    Modified Rankin (Stroke Patients Only)       Balance Overall balance assessment: Needs assistance;History of Falls Sitting-balance support: No upper extremity supported;Feet supported Sitting balance-Leahy Scale: Normal     Standing balance support: Bilateral upper extremity supported;During functional activity Standing balance-Leahy Scale: Poor Standing balance comment: Pt relies on RW for support.                     Cognition Arousal/Alertness: Awake/alert Behavior During Therapy: WFL for tasks assessed/performed Overall Cognitive Status: No family/caregiver present to determine baseline cognitive functioning           Safety/Judgement: Decreased awareness of safety          Exercises General Exercises - Lower Extremity Ankle Circles/Pumps: AROM;Both;10 reps;Seated Long Arc Quad: AROM;Both;10 reps;Seated    General Comments        Pertinent Vitals/Pain Pain  Assessment: No/denies pain   SATURATION QUALIFICATIONS: (This note is used to comply with regulatory documentation for home oxygen)  Patient Saturations on Room Air at Rest = 87%  Patient Saturations on Room Air while Ambulating = NT as O2 at rest 87%.  Patient Saturations on 3 Liters of oxygen while Ambulating =  90-94%  Please briefly explain why patient needs home oxygen:Pt needed O2 at rest and with ambulation for sats above 90%.  Home Living                      Prior Function            PT Goals (current goals can now be found in the care plan section) Acute Rehab PT Goals Patient Stated Goal: to go home Progress towards PT goals: Progressing toward goals    Frequency    Min 3X/week      PT Plan Discharge plan needs to be updated    Co-evaluation             End of Session Equipment Utilized During Treatment: Gait belt;Oxygen Activity Tolerance: Patient limited by fatigue Patient left: in chair;with call bell/phone within reach;with chair alarm set     Time: 7493-5521 PT Time Calculation (min) (ACUTE ONLY): 20 min  Charges:  $Gait Training: 8-22 mins                    G CodesTawni Millers F 03-18-16, 11:16 AM  [facility] [phone] (pager)

## 2016-03-09 NOTE — Consult Note (Signed)
   [facility] Inpatient Consult   03/09/2016  [name] [birth year] [id number]  Patient screened for potential [facility] services for possible restart of services. This is the patient's second hospitalization in 6 months per chart. Chart was reviewed for needs and medical record reveals, '[name] is a 70 y.o.malewith history of COPD, chronic respiratory failure on home oxygen, systolic CHF(EF 22-33% in 05/2015),mixed cardiomyopathy, A. fib on Eliquis,CAD s/p PCI, s/p MVreplacement, CVA, PVD, and HLD who presented with shortness of breath. Patient reported he had stopped taking some of his medications for 2 days because they "made him urinate too much."' Review also reveals daughter declined services earlier this year. Patient is eligible for [facility] Care Management services under patient's [facility]  plan.  Met patient at the bedside. HIPAA verified.  Patient's daughter was not available and a brochure was left at the bedside for review and questions.  Patient accepted and agreed for his daughter to review.  For questions contact:   Natividad Brood, RN BSN [facility] Liaison  [phone] business mobile phone Toll free office [phone]

## 2016-03-10 DIAGNOSIS — I509 Heart failure, unspecified: Secondary | ICD-10-CM

## 2016-03-10 LAB — BASIC METABOLIC PANEL
ANION GAP: 6 (ref 5–15)
BUN: 46 mg/dL — ABNORMAL HIGH (ref 6–20)
CHLORIDE: 92 mmol/L — AB (ref 101–111)
CO2: 40 mmol/L — AB (ref 22–32)
Calcium: 10 mg/dL (ref 8.9–10.3)
Creatinine, Ser: 1.33 mg/dL — ABNORMAL HIGH (ref 0.61–1.24)
GFR calc Af Amer: >60 mL/min (ref >60)
GFR, EST NON AFRICAN AMERICAN: 53 mL/min — AB (ref >60)
GLUCOSE: 149 mg/dL — AB (ref 65–99)
POTASSIUM: 4.8 mmol/L (ref 3.5–5.1)
Sodium: 138 mmol/L (ref 135–145)

## 2016-03-10 LAB — GLUCOSE, CAPILLARY: Glucose-Capillary: 175 mg/dL — ABNORMAL HIGH (ref 65–99)

## 2016-03-10 MED ORDER — METFORMIN HCL 500 MG PO TABS: 500.0000 mg | ORAL_TABLET | Freq: Two times a day (BID) | ORAL | 0 refills | Status: DC

## 2016-03-10 MED ORDER — FUROSEMIDE 40 MG PO TABS: 40.0000 mg | ORAL_TABLET | Freq: Every day | ORAL | 0 refills | Status: DC

## 2016-03-10 MED ORDER — SACUBITRIL-VALSARTAN 24-26 MG PO TABS: 1.0000 | ORAL_TABLET | Freq: Two times a day (BID) | ORAL | 0 refills | Status: DC

## 2016-03-10 MED ORDER — ATORVASTATIN CALCIUM 40 MG PO TABS: 40.0000 mg | ORAL_TABLET | Freq: Every day | ORAL | 0 refills | Status: DC

## 2016-03-10 MED ORDER — RIVAROXABAN 20 MG PO TABS: 20.0000 mg | ORAL_TABLET | Freq: Every day | ORAL | 0 refills | Status: DC

## 2016-03-10 NOTE — Care Management Note (Signed)
Case Management Note  Patient Details  Name: [name] MRN: [id number] Date of Birth: 23-Jun-1945  Subjective/Objective:    Admitted with Acute Resp Failure                Action/Plan: CM talked to patient about HHC choices, patient chose [facility]; [name] with [facility] called for arrangements. Patient also gave CM permission to call his daughter [name]. VM left with [name] to further discuss HHC and Entresto coupon card.  Expected Discharge Date:    03/10/2016              Expected Discharge Plan:  Home w Home Health Services  Discharge planning Services  CM Consult Choice offered to:  Patient  HH Arranged:  RN, PT [facility] Agency:  [facility]  Status of Service:  In process, will continue to follow  Reola Mosher [phone] 03/10/2016, 10:55 AM

## 2016-03-10 NOTE — Progress Notes (Signed)
Speech Language Pathology Treatment: Dysphagia  Patient Details Name: [name] [name] MRN: [id number] DOB: 06/09/1945 Today's Date: 03/10/2016 Time: 1210-1220 SLP Time Calculation (min) (ACUTE ONLY): 10 min  Assessment / Plan / Recommendation Clinical Impression  Pt consumed thin liquids, crackers with improved overall toleration; no overt s/s of aspiration; improved reciprocity of swallowing /ventilation.  Preparing for D/C home today - pt with chronic, mild risk of aspiration due to respiratory compromise.  Attempted education with son, pt - pt with poor understanding.  May d/c on soft solids, thin liquids.  No SLP f/u needed.    HPI HPI: 70 y.o. male with  medical history significant of COPD, chronic respiratory failure on home oxygen, systolic CHF last EF 20-25% in 05/2015, mixed cardiomyopathy, A. fib on Eliquis, CAD s/p PCI, s/p  MV replacement, CVA, PVD, HLD; presents with complaints of shortness of breath.  Dx with acute on chronic hypoic/hypercapnic respiratory failure with respiratory acidosis, acute on chronic systolic CHF, mild ARF, and mild elevation in troponin (likely demand ischemia).          SLP Plan  All goals met     Recommendations  Diet recommendations: Dysphagia 3 (mechanical soft);Thin liquid Liquids provided via: Cup Medication Administration: Whole meds with puree Supervision: Patient able to self feed                Oral Care Recommendations: Oral care BID Follow up Recommendations: 24 hour supervision/assistance Plan: All goals met       GO               L. , MA CCC/SLP Pager [phone]   ,  Laurice 03/10/2016, 12:22 PM    

## 2016-03-10 NOTE — Discharge Instructions (Signed)
1. Follow up with PCP in 1-2 weeks 2. Follow up with Heart Failure team on 03/17/2016 at 2pm  3. Please obtain BMP in one week  4. New diagnosis of diabetes, Ha1c 7.5. Start metformin at time of discharge.   HF Meds - Coreg 6.25 mg twice daily - Digoxin 0.125 mg daily - Entresto 24-26 mg twice daily (STOP losartan)  - Spironolactone 25 mg daily  - Atorvastatin 40 mg daily  - Xarelto 20 mg daily (STOP Eliquis)  - Lasix 40 mg once daily

## 2016-03-10 NOTE — Progress Notes (Signed)
Patient ID: [name], male   DOB: 28-Nov-1945, 70 y.o.   MRN: [id number]      Advanced Heart Failure Rounding Note  Referring Physician: Dr. Thedore Mins Primary Physician: Cain Saupe Primary Cardiologist:  Dr. Shirlee Latch   Reason for Consultation: A/C systolic HF  Subjective:    Yesterday lasix was cut back to 40 mg daily + entresto 24-26 mg twice a day. Weight up 3 pounds.   Ongoing dyspnea with exertion.     03/05/2016 Echo: EF 30-35%, moderately decreased RV systolic function, stable bioprosthetic mitral valve.   Objective:   Weight Range: 161 lb (73 kg) Body mass index is 28.52 kg/m.   Vital Signs:   Temp:  [97.6 F (36.4 C)-98 F (36.7 C)] 97.6 F (36.4 C) (10/03 0515) Pulse Rate:  [59-104] 60 (10/03 0515) Resp:  [18] 18 (10/03 0515) BP: (109-137)/(61-69) 137/69 (10/03 0515) SpO2:  [91 %-96 %] 93 % (10/03 0515) Weight:  [161 lb (73 kg)] 161 lb (73 kg) (10/03 0515) Last BM Date: 03/09/16  Weight change: Filed Weights   03/08/16 0405 03/09/16 0412 03/10/16 0515  Weight: 160 lb 8 oz (72.8 kg) 158 lb 1.6 oz (71.7 kg) 161 lb (73 kg)    Intake/Output:   Intake/Output Summary (Last 24 hours) at 03/10/16 0935 Last data filed at 03/10/16 0827  Gross per 24 hour  Intake              720 ml  Output              450 ml  Net              270 ml     Physical Exam: General:  Elderly and chronically ill appearing. In the bed  HEENT: Normal, poor personal grooming, Bipap in place.  Neck: supple. JVP 8-9 cm. Carotids 2+ bilat; no bruits. No thyromegaly or nodule noted.  Cor: PMI nondisplaced. Regular.  No M/G/R Lungs: RML RLL LLL rhonchi.on 2 liters.  Abdomen: soft, NT, ND, no HSM. No bruits or masses. +BS  Extremities: no cyanosis, clubbing, rash. No edema.  Neuro: alert & orientedx3, cranial nerves grossly intact. moves all 4 extremities w/o difficulty. Affect flat  Telemetry: Pacing with underlying Afib - Rate controlled.  Labs: CBC No results for input(s): WBC,  NEUTROABS, HGB, HCT, MCV, PLT in the last 72 hours. Basic Metabolic Panel  Recent Labs  03/09/16 0511 03/10/16 0251  NA 145 138  K 4.6 4.8  CL 95* 92*  CO2 44* 40*  GLUCOSE 209* 149*  BUN 37* 46*  CREATININE 1.17 1.33*  CALCIUM 10.0 10.0   Liver Function Tests No results for input(s): AST, ALT, ALKPHOS, BILITOT, PROT, ALBUMIN in the last 72 hours. No results for input(s): LIPASE, AMYLASE in the last 72 hours. Cardiac Enzymes No results for input(s): CKTOTAL, CKMB, CKMBINDEX, TROPONINI in the last 72 hours.  BNP: BNP (last 3 results)  Recent Labs  05/22/15 2107 03/02/16 1940 03/07/16 0417  BNP 592.8* 361.9* 183.9*    ProBNP (last 3 results) No results for input(s): PROBNP in the last 8760 hours.   D-Dimer No results for input(s): DDIMER in the last 72 hours. Hemoglobin A1C No results for input(s): HGBA1C in the last 72 hours. Fasting Lipid Panel No results for input(s): CHOL, HDL, LDLCALC, TRIG, CHOLHDL, LDLDIRECT in the last 72 hours. Thyroid Function Tests No results for input(s): TSH, T4TOTAL, T3FREE, THYROIDAB in the last 72 hours.  Invalid input(s): FREET3  Other results:  Imaging/Studies:  Dg Chest 2 View  Result Date: 03/09/2016 CLINICAL DATA:  COPD, atrial fibrillation, CHF. EXAM: CHEST  2 VIEW COMPARISON:  Portable chest x-ray of March 03, 2016 FINDINGS: The lungs are adequately inflated. The interstitial markings have decreased in conspicuity. The cardiac silhouette is mildly enlarged. The pulmonary vascularity is less engorged. The permanent pacemaker defibrillator is in stable position. A prosthetic mitral valve appears stable. Previous CABG. Aortic atherosclerosis. Pleural calcification on the left. Old posterior right rib fractures are present. IMPRESSION: Decreased pulmonary interstitial edema. Stable cardiomegaly. The ICD is in stable position. Aortic atherosclerosis. Electronically Signed   By: David  [place] M.D.   On: 03/09/2016 08:10      Latest Echo  Latest Cath   Medications:     Scheduled Medications: . atorvastatin  40 mg Oral q1800  . carvedilol  6.25 mg Oral BID WC  . dextromethorphan-guaiFENesin  1 tablet Oral BID  . digoxin  0.125 mg Oral QHS  . fenofibrate  54 mg Oral Daily  . folic acid  1 mg Oral Daily  . furosemide  40 mg Oral Daily  . insulin aspart  0-15 Units Subcutaneous TID WC  . insulin glargine  5 Units Subcutaneous Daily  . ipratropium-albuterol  3 mL Nebulization QID  . living well with diabetes book   Does not apply Once  . mometasone-formoterol  2 puff Inhalation BID  . multivitamin with minerals  1 tablet Oral Daily  . rivaroxaban  20 mg Oral Q supper  . sacubitril-valsartan  1 tablet Oral BID  . spironolactone  25 mg Oral QHS  . thiamine  100 mg Oral Daily    Infusions:    PRN Medications: acetaminophen, albuterol, food thickener, nitroGLYCERIN, ondansetron (ZOFRAN) IV, RESOURCE THICKENUP CLEAR   Assessment/Plan   1. Acute on Chronic systolic CHF: ECHO this admission with EF 30-35%, moderately decreased RV systolic function, stable bioprosthetic mitral valve. Mixed ischemic/nonischemic (ETOH) cardiomyopathy. S/p Medtronic CRT-D. Diuresed with IV lasix then transitioned to po lasix 40 mg twice a day. Overall diuresed 13 pounds.  Volume status ok. Continue lasix to 40 mg daily. May need to add 40 mg lasix in the evening as an outpatient.  Renal function up a little. Continue spironolactone 25 mg daily - Continue Coreg 6.25 mg BID. - Continue digoxin 0.125 mg daily, level 0.5    - Continue entresto 24-26 mg twice a day.  Pt has ongoing medical and dietary noncompliance. Would be a good candidate for paramedicine. 2. COPD: Acute exacerbation of COPD.  Per primary. Currently on BiPAP, Nebs, Steroids, and abx for AECOPD.  Still rhonchorous. 3. Atrial fibrillation:Chronic. He has had AV nodal ablation with BiV pacing.  - With non-compliance to BID dosing, switched from Eliquis  to Xarelto 20 mg. 4. CAD: Denies chest pain. LHC (3/16) with moderate distal LM stenosis (IVUS 6.9 mm2), 80% ostial LCx, total occlusion RCA with collaterals. Ostial LCx not ideal for PCI, managed medically.  - No ASA given anticoagulation use. - Continue atorvastatin to 40 with LDL > 70.   5. Mitral valve replacement:Bioprosthetic valve currently, originally Bjork-Shiley. Valve stable on echo this admission.  6. Smoking:  Continues to smoke > 1 ppd. Has continually refused cessation.  7. AKI: Resolved.  Todays Creatinine 1.33  HF Meds - Coreg 6.25 mg twice day - Dig 0.125 mg daily - Entresto 24-26 mg twice daily - Spiro 25 mg daily  -Atorvastatin 40 mg daily  - Xarelto 20 mg daily -Lasix 40 mg daily  HF follow up 03/17/2016 at 2:00  Length of Stay: 8  Amy Clegg NP-C 03/10/2016, 9:35 AM  Advanced Heart Failure Team Pager [phone] (M-F; 7a - 4p)  Please contact [facility] for night-coverage after hours (4p -7a ) and weekends on [email or web address]  Patient seen with with NP, agree with the above note.  He is going home today, will followup with us on 10/10.  Agree with the above medication list for his cardiac med regimen.   Marca AnconaDalton Madora Barletta 03/10/2016

## 2016-03-10 NOTE — Discharge Summary (Signed)
Physician Discharge Summary  [name] [name] JPE:[id number] DOB: 06-18-1945 DOA: 03/02/2016  PCP: Dominic Saupe, MD  Admit date: 03/02/2016 Discharge date: 03/10/2016  Admitted From: Home Disposition:  Home  Recommendations for Outpatient Follow-up:  1. Follow up with PCP in 1-2 weeks 2. Follow up with Heart Failure team on 03/17/2016 at 2pm  3. Please obtain BMP in one week  4. New diagnosis of diabetes, Ha1c 7.5. Start metformin at time of discharge.   HF Meds - Coreg 6.25 mg twice daily - Digoxin 0.125 mg daily - Entresto 24-26 mg twice daily (STOP losartan)  - Spironolactone 25 mg daily  - Atorvastatin 40 mg daily  - Xarelto 20 mg daily (STOP Eliquis)  - Lasix 40 mg once daily  Home Health: Home health PT   Equipment/Devices: None   Discharge Condition: Stable CODE STATUS: Full  Diet recommendation: heart healthy  Brief/Interim Summary: [name] [name] is a 70 y.o.malewith history of COPD, chronic respiratory failure on home oxygen, systolic CHF(EF 20-25% in 05/2015),mixed cardiomyopathy, A. fib on Eliquis,CAD s/p PCI, s/p MVreplacement, CVA, PVD, and HLD who presented with shortness of breath. Patient reported he had stopped taking some of his medications for 2 days because they "made him urinate too much."  Patient was admitted to [facility] for acute on chronic hypoxemic and hypercapnic respiratory failure, exacerbation of COPD, and acute on chronic systolic heart failure. Patient was evaluated by cardiology during his hospitalization. He was diuresed with IV diuretics, which was then transitioned to oral diuretic. Patient continued to diurese well. He did receive steroids as well as antibiotics for his acute COPD exacerbation, which have been completed at the time of discharge. Patient was back on his baseline oxygen level at time of discharge. He is to follow up with his PCP as well as heart failure team. Plan was also discussed with son-in-law [name] [name]) and son [name] [name].) time of discharge.  Discharge Diagnoses:  Principal Problem:   Acute respiratory failure with hypoxia and hypercapnia (HCC) Active Problems:   Atrial fibrillation, chronic (HCC)   Hyperlipidemia   Automatic implantable cardioverter-defibrillator in situ   Chronic anticoagulation   Alcohol abuse   Cigarette smoker   Chronic respiratory failure with hypoxia (HCC)   CAD (coronary artery disease)   COPD exacerbation (HCC)   Acute on chronic systolic heart failure (HCC)  Acute on chronic hypoxic/hypercapnic respiratory failure with respiratory acidosis -Secondary to combination of acute on chronic systolic CHF and COPD exacerbation -Requires 2L  Bend at baseline (on and off, esp at nighttime)  -Off bipap   Acute COPD exacerbation/bronchitis  -5 day tx course rocephin/azithromax  -Received steroids this admission, completed course  -Duoneb, dulera  -Mucinex  -Incentive spirometry and flutter valve per RT   Acute on chronic systolic CHF -EF 72-25%, moderately dec RV systolic function  -Down 13 lbs and >8L  -Cardiology following, appreciate -Repeat CXR improved -Coreg 6.25 mg twice day -Dig 0.125 mg daily -Entresto 24-26 mg twice daily -Spiro 25 mg daily  -Atorvastatin 40 mg daily  -Xarelto 20 mg daily -Lasix 40 mg daily  Chronic atrial fibrillation -Hx AV node ablation w/ BiV pacing -Continue coreg, digoxin and Xarelto (eliquis switched to xarelto for once daily dosing)   Mild elevation in troponin -Chest pain-free -Likely due to demand ischemia   Dyslipidemia -Lipitor increased to 40mg  -Continue fenofibrate  S/P mitral valve replacement  -Bioprosthetic valve   DM type 2, new dx  -A1c 7.5. No previous hx of Dm.  -Will  start metformin at time of discharge -Lantus 5u daily  -Continue SSI  -Consult diabetic ed   Alcohol abuse -Counseled to quit  -Now off CIWA    Discharge Instructions  Discharge Instructions    (HEART FAILURE PATIENTS) Call MD:   Anytime you have any of the following symptoms: 1) 3 pound weight gain in 24 hours or 5 pounds in 1 week 2) shortness of breath, with or without a dry hacking cough 3) swelling in the hands, feet or stomach 4) if you have to sleep on extra pillows at night in order to breathe.    Complete by:  As directed    Diet - low sodium heart healthy    Complete by:  As directed    Increase activity slowly    Complete by:  As directed        Medication List    STOP taking these medications   apixaban 5 MG Tabs tablet Commonly known as:  ELIQUIS   isosorbide mononitrate 30 MG 24 hr tablet Commonly known as:  IMDUR   losartan 50 MG tablet Commonly known as:  COZAAR   phenylephrine 10 MG Tabs tablet Commonly known as:  SUDAFED PE     TAKE these medications   albuterol 108 (90 Base) MCG/ACT inhaler Commonly known as:  PROVENTIL HFA;VENTOLIN HFA Inhale 2 puffs into the lungs every 4 (four) hours as needed for wheezing or shortness of breath.   atorvastatin 40 MG tablet Commonly known as:  LIPITOR Take 1 tablet (40 mg total) by mouth daily. What changed:  medication strength  how much to take   carvedilol 6.25 MG tablet Commonly known as:  COREG Take 1 tablet (6.25 mg total) by mouth 2 (two) times daily with a meal. What changed:  how much to take  when to take this   digoxin 0.125 MG tablet Commonly known as:  LANOXIN Take 1 tablet (0.125 mg total) by mouth daily. What changed:  when to take this   fenofibrate 48 MG tablet Commonly known as:  TRICOR Take 1 tablet (48 mg total) by mouth daily. Reported on 05/28/2015   furosemide 40 MG tablet Commonly known as:  LASIX Take 1 tablet (40 mg total) by mouth daily. What changed:  medication strength  when to take this   loratadine 10 MG tablet Commonly known as:  CLARITIN Take 10 mg by mouth daily as needed for allergies.   metFORMIN 500 MG tablet Commonly known as:  GLUCOPHAGE Take 1 tablet (500 mg total) by mouth 2  (two) times daily with a meal.   multivitamin with minerals Tabs tablet Take 1 tablet by mouth daily.   nitroGLYCERIN 0.4 MG SL tablet Commonly known as:  NITROSTAT Place 1 tablet (0.4 mg total) under the tongue every 5 (five) minutes as needed for chest pain.   ranitidine 150 MG tablet Commonly known as:  ZANTAC Take 150 mg by mouth daily as needed for heartburn.   rivaroxaban 20 MG Tabs tablet Commonly known as:  XARELTO Take 1 tablet (20 mg total) by mouth daily with supper.   sacubitril-valsartan 24-26 MG Commonly known as:  ENTRESTO Take 1 tablet by mouth 2 (two) times daily.   spironolactone 25 MG tablet Commonly known as:  ALDACTONE Take 1 tablet (25 mg total) by mouth daily. What changed:  when to take this   thiamine 100 MG tablet Take 1 tablet (100 mg total) by mouth daily.      Follow-up Information  [facility] Follow up on 03/17/2016.   Specialty:  Cardiology Why:  at 2 pm for post hospital follow up. Please bring all of your medications to your visit. The code for parking is 4000. Contact information: [address] [id number] mc [place] [address] [phone]       Dominic Saupe, MD. Schedule an appointment as soon as possible for a visit in 1 week(s).   Specialty:  Family Medicine Contact information: [address] [place] [address] [phone]          Allergies  Allergen Reactions  . Warfarin Other (See Comments)    Non compliance and ETOH abuse    Consultations:  Cardiology    Procedures/Studies: Dg Chest 2 View  Result Date: 03/09/2016 CLINICAL DATA:  COPD, atrial fibrillation, CHF. EXAM: CHEST  2 VIEW COMPARISON:  Portable chest x-ray of March 03, 2016 FINDINGS: The lungs are adequately inflated. The interstitial markings have decreased in conspicuity. The cardiac silhouette is mildly enlarged. The pulmonary vascularity is less engorged. The permanent  pacemaker defibrillator is in stable position. A prosthetic mitral valve appears stable. Previous CABG. Aortic atherosclerosis. Pleural calcification on the left. Old posterior right rib fractures are present. IMPRESSION: Decreased pulmonary interstitial edema. Stable cardiomegaly. The ICD is in stable position. Aortic atherosclerosis. Electronically Signed   By: David  [place] M.D.   On: 03/09/2016 08:10   Dg Chest Port 1 View  Result Date: 03/03/2016 CLINICAL DATA:  Restlessness. Coronary artery disease. Cardiac arrhythmia. EXAM: PORTABLE CHEST 1 VIEW COMPARISON:  March 03, 2016 study obtained earlier in the day FINDINGS: There remains underlying interstitial edema. There has been partial but incomplete clearing of airspace opacity from the left lower lobe. No new opacity evident. There is cardiomegaly with pulmonary venous hypertension. Pacemaker leads are attached to the right atrium, right ventricle, and left ventricle. Patient is status post mitral valve replacement. No adenopathy. There old healed rib fractures on the right. IMPRESSION: Compared to earlier in the day, there is partial clearing of airspace opacity from the left base, likely partial clearing of alveolar edema. There is underlying interstitial edema elsewhere which is stable. No new opacity. Stable cardiac silhouette. No pneumothorax. Old rib fractures on the right noted. Electronically Signed   By: Bretta Bang III M.D.   On: 03/03/2016 14:07   Dg Chest Port 1 View  Result Date: 03/03/2016 CLINICAL DATA:  Shortness of breath EXAM: PORTABLE CHEST 1 VIEW COMPARISON:  03/02/2016 FINDINGS: Chronic cardiomegaly. Direct and transvenous pacer/ ICD wires in unchanged position. Status post mitral valve replacement. Low volume chest with patchy bilateral opacity. Possible pleural fluid or thickening on the left. No visible pneumothorax. IMPRESSION: Unchanged bilateral lung opacity which could be infectious or congestive. Electronically  Signed   By: Marnee Spring M.D.   On: 03/03/2016 07:23   Dg Chest Portable 1 View  Result Date: 03/02/2016 CLINICAL DATA:  Nausea and vomiting for 3 days. Cough. Wheezing. Hypoxia. EXAM: PORTABLE CHEST 1 VIEW COMPARISON:  11/22/2015 FINDINGS: Pacer/defibrillator leads appear stable. Prosthetic tricuspid valve. Moderate enlargement of the cardiopericardial silhouette with low lung volumes and bilateral interstitial and patchy airspace opacities. Mildly elevated right hemidiaphragm. Calcifications along the left hemidiaphragm. Old right rib deformities, healed. IMPRESSION: 1. Bilateral airspace opacities, left greater than right, especially in the perihilar region, accompanied by indistinct pulmonary vasculature and moderate enlargement of the cardiopericardial silhouette, favoring acute pulmonary edema over multilobar pneumonia. 2. Elevated right hemidiaphragm. 3. Stable appearance  of the pacer/defibrillator leads and prosthetic mitral valve. Electronically Signed   By: Gaylyn Rong M.D.   On: 03/02/2016 20:21    Echo 03/05/2016 Study Conclusions  - Left ventricle: The cavity size was mildly dilated. Wall   thickness was normal. Systolic function was moderately to   severely reduced. The estimated ejection fraction was in the   range of 30% to 35%. Hypokinesis of the inferolateral and   inferior myocardium. Hypokinesis of the apical myocardium. The   study is not technically sufficient to allow evaluation of LV   diastolic function. - Ventricular septum: Septal motion showed paradox. These changes   are consistent with right ventricular pacing. - Mitral valve: A bioprosthesis was present and functioning   normally. Valve area by pressure half-time: 1.89 cm^2. Valve area   by continuity equation (using LVOT flow): 0.87 cm^2. - Left atrium: The atrium was moderately dilated. - Right ventricle: Systolic function was moderately reduced.    Subjective: Patient feeling well this morning.  He denies any chest pain or shortness of breath. No nausea or vomiting.  Discharge Exam: Vitals:   03/09/16 2103 03/10/16 0515  BP:  137/69  Pulse: 62 60  Resp:  18  Temp:  97.6 F (36.4 C)   Vitals:   03/09/16 2009 03/09/16 2103 03/09/16 2141 03/10/16 0515  BP: 109/61   137/69  Pulse: (!) 104 62  60  Resp: 18   18  Temp: 97.8 F (36.6 C)   97.6 F (36.4 C)  TempSrc: Oral   Oral  SpO2: 96%  96% 93%  Weight:    73 kg (161 lb)  Height:        General exam: Appears calm and comfortable  Respiratory system: Coarse breath sounds bilaterally, no wheeze, respiratory effort is normal, no conversational dyspnea Cardiovascular system: S1 & S2 heard, irregular rhythm, rate controlled. No JVD, murmurs, rubs, gallops or clicks. No pedal edema. Gastrointestinal system: Abdomen is nondistended, soft and nontender. No organomegaly or masses felt. Normal bowel sounds heard. Central nervous system: Alert. No focal neurological deficits. Extremities: Symmetric 5 x 5 power. Skin: No rashes, lesions or ulcers    The results of significant diagnostics from this hospitalization (including imaging, microbiology, ancillary and laboratory) are listed below for reference.     Microbiology: Recent Results (from the past 240 hour(s))  MRSA PCR Screening     Status: None   Collection Time: 03/03/16  2:34 AM  Result Value Ref Range Status   MRSA by PCR NEGATIVE NEGATIVE Final    Comment:        The GeneXpert MRSA Assay (FDA approved for NASAL specimens only), is one component of a comprehensive MRSA colonization surveillance program. It is not intended to diagnose MRSA infection nor to guide or monitor treatment for MRSA infections.      Labs: BNP (last 3 results)  Recent Labs  05/22/15 2107 03/02/16 1940 03/07/16 0417  BNP 592.8* 361.9* 183.9*   Basic Metabolic Panel:  Recent Labs Lab 03/06/16 0237 03/07/16 0417 03/08/16 0515 03/09/16 0511 03/10/16 0251  NA 140 145 145  145 138  K 4.4 4.8 4.9 4.6 4.8  CL 96* 99* 95* 95* 92*  CO2 37* 39* 44* 44* 40*  GLUCOSE 238* 183* 155* 209* 149*  BUN 55* 45* 39* 37* 46*  CREATININE 1.69* 1.52* 1.23 1.17 1.33*  CALCIUM 9.6 9.8 10.4* 10.0 10.0   Liver Function Tests: No results for input(s): AST, ALT, ALKPHOS, BILITOT, PROT, ALBUMIN in the  last 168 hours. No results for input(s): LIPASE, AMYLASE in the last 168 hours. No results for input(s): AMMONIA in the last 168 hours. CBC:  Recent Labs Lab 03/04/16 0450 03/05/16 0448  WBC 14.6* 17.0*  HGB 12.7* 13.1  HCT 42.3 44.3  MCV 96.4 96.3  PLT 162 180   Cardiac Enzymes:  Recent Labs Lab 03/03/16 1116  TROPONINI 0.03*   BNP: Invalid input(s): POCBNP CBG:  Recent Labs Lab 03/09/16 0613 03/09/16 1106 03/09/16 1701 03/09/16 2116 03/10/16 0600  GLUCAP 212* 207* 204* 198* 175*   D-Dimer No results for input(s): DDIMER in the last 72 hours. Hgb A1c No results for input(s): HGBA1C in the last 72 hours. Lipid Profile No results for input(s): CHOL, HDL, LDLCALC, TRIG, CHOLHDL, LDLDIRECT in the last 72 hours. Thyroid function studies No results for input(s): TSH, T4TOTAL, T3FREE, THYROIDAB in the last 72 hours.  Invalid input(s): FREET3 Anemia work up No results for input(s): VITAMINB12, FOLATE, FERRITIN, TIBC, IRON, RETICCTPCT in the last 72 hours. Urinalysis    Component Value Date/Time   COLORURINE YELLOW 12/24/2014 0311   APPEARANCEUR CLEAR 12/24/2014 0311   LABSPEC 1.010 12/24/2014 0311   PHURINE 5.5 12/24/2014 0311   GLUCOSEU NEGATIVE 12/24/2014 0311   HGBUR NEGATIVE 12/24/2014 0311   BILIRUBINUR NEGATIVE 12/24/2014 0311   KETONESUR NEGATIVE 12/24/2014 0311   PROTEINUR NEGATIVE 12/24/2014 0311   UROBILINOGEN 0.2 12/24/2014 0311   NITRITE NEGATIVE 12/24/2014 0311   LEUKOCYTESUR NEGATIVE 12/24/2014 0311   Sepsis Labs Invalid input(s): PROCALCITONIN,  WBC,  LACTICIDVEN Microbiology Recent Results (from the past 240 hour(s))  MRSA PCR  Screening     Status: None   Collection Time: 03/03/16  2:34 AM  Result Value Ref Range Status   MRSA by PCR NEGATIVE NEGATIVE Final    Comment:        The GeneXpert MRSA Assay (FDA approved for NASAL specimens only), is one component of a comprehensive MRSA colonization surveillance program. It is not intended to diagnose MRSA infection nor to guide or monitor treatment for MRSA infections.      Time coordinating discharge: Over 30 minutes  SIGNED:  Noralee Stain, DO [name] Pager [phone]  If 7PM-7AM, please contact night-coverage [email or web address] Password [email or web address] 03/10/2016, 10:42 AM

## 2016-03-10 NOTE — Progress Notes (Signed)
CM talked to patient's son at the bedside about Entresto coupon card and to go to [facility] to fill the prescription. Also talked to him about HHC, pt chose [facility]; All questions answered. Abelino Derrick [facility] [phone]

## 2016-03-10 NOTE — Progress Notes (Signed)
Patient and son given discharge instructions and all questions answered.  Patient discharged with belongings via wheelchair.

## 2016-03-17 ENCOUNTER — Encounter (HOSPITAL_COMMUNITY): Payer: Medicare Other

## 2016-03-18 ENCOUNTER — Encounter (HOSPITAL_COMMUNITY): Payer: Self-pay

## 2016-03-18 ENCOUNTER — Ambulatory Visit (HOSPITAL_COMMUNITY)
Admission: RE | Admit: 2016-03-18 | Discharge: 2016-03-18 | Disposition: A | Discharge status: Home / Self Care | Payer: Medicare Other | Source: Ambulatory Visit | Attending: Cardiology | Admitting: Cardiology

## 2016-03-18 ENCOUNTER — Encounter (HOSPITAL_COMMUNITY): Payer: Medicare Other

## 2016-03-18 VITALS — BP 138/76 | HR 72 | Ht 63.0 in | Wt 164.4 lb

## 2016-03-18 DIAGNOSIS — I428 Other cardiomyopathies: Secondary | ICD-10-CM | POA: Diagnosis not present

## 2016-03-18 DIAGNOSIS — I739 Peripheral vascular disease, unspecified: Secondary | ICD-10-CM | POA: Insufficient documentation

## 2016-03-18 DIAGNOSIS — J449 Chronic obstructive pulmonary disease, unspecified: Secondary | ICD-10-CM | POA: Insufficient documentation

## 2016-03-18 DIAGNOSIS — I2510 Atherosclerotic heart disease of native coronary artery without angina pectoris: Secondary | ICD-10-CM | POA: Diagnosis not present

## 2016-03-18 DIAGNOSIS — F1010 Alcohol abuse, uncomplicated: Secondary | ICD-10-CM

## 2016-03-18 DIAGNOSIS — I426 Alcoholic cardiomyopathy: Secondary | ICD-10-CM | POA: Diagnosis not present

## 2016-03-18 DIAGNOSIS — Z7901 Long term (current) use of anticoagulants: Secondary | ICD-10-CM | POA: Diagnosis not present

## 2016-03-18 DIAGNOSIS — Z79899 Other long term (current) drug therapy: Secondary | ICD-10-CM | POA: Diagnosis not present

## 2016-03-18 DIAGNOSIS — I481 Persistent atrial fibrillation: Secondary | ICD-10-CM | POA: Diagnosis not present

## 2016-03-18 DIAGNOSIS — I482 Chronic atrial fibrillation: Secondary | ICD-10-CM | POA: Insufficient documentation

## 2016-03-18 DIAGNOSIS — I5022 Chronic systolic (congestive) heart failure: Secondary | ICD-10-CM | POA: Insufficient documentation

## 2016-03-18 DIAGNOSIS — I4819 Other persistent atrial fibrillation: Secondary | ICD-10-CM

## 2016-03-18 DIAGNOSIS — F17210 Nicotine dependence, cigarettes, uncomplicated: Secondary | ICD-10-CM | POA: Diagnosis not present

## 2016-03-18 DIAGNOSIS — Z7984 Long term (current) use of oral hypoglycemic drugs: Secondary | ICD-10-CM | POA: Diagnosis not present

## 2016-03-18 DIAGNOSIS — Z953 Presence of xenogenic heart valve: Secondary | ICD-10-CM | POA: Insufficient documentation

## 2016-03-18 DIAGNOSIS — F10988 Alcohol use, unspecified with other alcohol-induced disorder: Secondary | ICD-10-CM | POA: Insufficient documentation

## 2016-03-18 DIAGNOSIS — I110 Hypertensive heart disease with heart failure: Secondary | ICD-10-CM | POA: Diagnosis not present

## 2016-03-18 DIAGNOSIS — Z720 Tobacco use: Secondary | ICD-10-CM | POA: Diagnosis not present

## 2016-03-18 LAB — BASIC METABOLIC PANEL
Anion gap: 5 (ref 5–15)
BUN: 14 mg/dL (ref 6–20)
CALCIUM: 10 mg/dL (ref 8.9–10.3)
CHLORIDE: 103 mmol/L (ref 101–111)
CO2: 30 mmol/L (ref 22–32)
CREATININE: 1.12 mg/dL (ref 0.61–1.24)
Glucose, Bld: 141 mg/dL — ABNORMAL HIGH (ref 65–99)
Potassium: 5.2 mmol/L — ABNORMAL HIGH (ref 3.5–5.1)
SODIUM: 138 mmol/L (ref 135–145)

## 2016-03-18 LAB — BRAIN NATRIURETIC PEPTIDE: B NATRIURETIC PEPTIDE 5: 105.8 pg/mL — AB (ref 0.0–100.0)

## 2016-03-18 MED ORDER — CARVEDILOL 6.25 MG PO TABS
9.3750 mg | ORAL_TABLET | Freq: Two times a day (BID) | ORAL | 3 refills | Status: DC
Start: 2016-03-18 — End: 2016-04-01

## 2016-03-18 NOTE — Patient Instructions (Signed)
Will refer you to [facility] with Dr. [name] at [facility] for COPD. [address], [place] [address] Phone: [phone]  _________________________________________________  _________________________________________________  Routine lab work today. Will notify you of abnormal results, otherwise no news is good news!  INCREASE Carvedilol (Coreg) to 9.375 mg (1.5 tabs) twice daily.  Follow up 2 weeks with Amy Clegg NP-C.  Do the following things EVERYDAY: 1) Weigh yourself in the morning before breakfast. Write it down and keep it in a log. 2) Take your medicines as prescribed 3) Eat low salt foods-Limit salt (sodium) to 2000 mg per day.  4) Stay as active as you can everyday 5) Limit all fluids for the day to less than 2 liters
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
## 2016-03-19 ENCOUNTER — Telehealth (HOSPITAL_COMMUNITY): Payer: Self-pay

## 2016-03-19 NOTE — Telephone Encounter (Signed)
Corliss Blacker Peptide  Order: [id number]  Status:  Final result Visible to patient:  No (Not Released) Dx:  Chronic systolic CHF (congestive hear...  Notes Recorded by Chyrl Civatte, RN on 03/19/2016 at 3:04 PM EDT LVMTCB. ------  Notes Recorded by Sherald Hess, NP on 03/18/2016 at 4:04 PM EDT Please call and ask him to cut back spiro to 12.5 mg daily. Also instruct to avoid Ms Sharilyn Sites and other high potassium foods like bananas/tomotoes.

## 2016-03-27 ENCOUNTER — Other Ambulatory Visit (HOSPITAL_COMMUNITY): Payer: Self-pay

## 2016-03-27 MED ORDER — SPIRONOLACTONE 25 MG PO TABS: 12.5000 mg | ORAL_TABLET | Freq: Every day | ORAL | 3 refills | Status: DC

## 2016-03-31 ENCOUNTER — Ambulatory Visit (INDEPENDENT_AMBULATORY_CARE_PROVIDER_SITE_OTHER): Payer: Medicare Other | Admitting: Internal Medicine

## 2016-03-31 ENCOUNTER — Encounter: Payer: Self-pay | Admitting: Internal Medicine

## 2016-03-31 VITALS — BP 122/74 | HR 75 | Ht 67.0 in | Wt 163.6 lb

## 2016-03-31 DIAGNOSIS — J449 Chronic obstructive pulmonary disease, unspecified: Secondary | ICD-10-CM | POA: Diagnosis not present

## 2016-03-31 DIAGNOSIS — J9611 Chronic respiratory failure with hypoxia: Secondary | ICD-10-CM | POA: Diagnosis not present

## 2016-03-31 DIAGNOSIS — F17210 Nicotine dependence, cigarettes, uncomplicated: Secondary | ICD-10-CM | POA: Diagnosis not present

## 2016-03-31 MED ORDER — ALBUTEROL SULFATE HFA 108 (90 BASE) MCG/ACT IN AERS: INHALATION_SPRAY | RESPIRATORY_TRACT | 1 refills | Status: DC

## 2016-03-31 NOTE — Assessment & Plan Note (Signed)
10/11/2015 referred for BEST FIT for amb 02  - as of 03/31/2016 not using any daytime 02 and not limited by sats/ sob   For now just use 02 hs and prn daytime

## 2016-03-31 NOTE — Assessment & Plan Note (Signed)

## 2016-03-31 NOTE — Assessment & Plan Note (Signed)
Spirometry 10/11/15  Restrictive only  - 03/31/2016  Walked RA x 3 laps @ 185 ft each stopped due to  End of study, nl pace, no sob or desat     I cannot detect any significant copd / AB and note not using 02 or saba at this point   Key therefore is understanding how/ when to use saba and call for appt with [place] [name] is pulmonary of record) and to stop smoking now (see separate a/p)   - The proper method of use, as well as anticipated side effects, of a metered-dose inhaler are discussed and demonstrated to the patient.    I had an extended discussion with the patient reviewing all relevant studies completed to date and  lasting 15 to 20 minutes of a 25 minute visit    Each maintenance medication was reviewed in detail including most importantly the difference between maintenance and prns and under what circumstances the prns are to be triggered using an action plan format that is not reflected in the computer generated alphabetically organized AVS.    Please see instructions for details which were reviewed in writing and the patient given a copy highlighting the part that I personally wrote and discussed at today's ov.

## 2016-03-31 NOTE — Patient Instructions (Addendum)
Only use your albuterol (proair) as a rescue medication to be used if you can't catch your breath by resting or doing a relaxed purse lip breathing pattern.  - The less you use it, the better it will work when you need it. - Ok to use up to 2 puffs  every 4 hours if you must but call for immediate appointment if use goes up over your usual need - Don't leave home without it !!  (think of it like the spare tire for your car)   02 is 2lpm at bedtime and as needed during the day   Pulmonary follow up is as needed
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
## 2016-04-01 ENCOUNTER — Ambulatory Visit (HOSPITAL_COMMUNITY)
Admission: RE | Admit: 2016-04-01 | Discharge: 2016-04-01 | Disposition: A | Discharge status: Home / Self Care | Payer: Medicare Other | Source: Ambulatory Visit | Attending: Cardiology | Admitting: Cardiology

## 2016-04-01 ENCOUNTER — Encounter (HOSPITAL_COMMUNITY): Payer: Self-pay

## 2016-04-01 VITALS — BP 100/66 | HR 89 | Wt 163.0 lb

## 2016-04-01 DIAGNOSIS — J449 Chronic obstructive pulmonary disease, unspecified: Secondary | ICD-10-CM | POA: Insufficient documentation

## 2016-04-01 DIAGNOSIS — F17210 Nicotine dependence, cigarettes, uncomplicated: Secondary | ICD-10-CM | POA: Diagnosis not present

## 2016-04-01 DIAGNOSIS — Z8673 Personal history of transient ischemic attack (TIA), and cerebral infarction without residual deficits: Secondary | ICD-10-CM | POA: Diagnosis not present

## 2016-04-01 DIAGNOSIS — Z7984 Long term (current) use of oral hypoglycemic drugs: Secondary | ICD-10-CM | POA: Insufficient documentation

## 2016-04-01 DIAGNOSIS — F1010 Alcohol abuse, uncomplicated: Secondary | ICD-10-CM | POA: Diagnosis not present

## 2016-04-01 DIAGNOSIS — I110 Hypertensive heart disease with heart failure: Secondary | ICD-10-CM | POA: Diagnosis not present

## 2016-04-01 DIAGNOSIS — I2510 Atherosclerotic heart disease of native coronary artery without angina pectoris: Secondary | ICD-10-CM | POA: Diagnosis not present

## 2016-04-01 DIAGNOSIS — I255 Ischemic cardiomyopathy: Secondary | ICD-10-CM | POA: Insufficient documentation

## 2016-04-01 DIAGNOSIS — I481 Persistent atrial fibrillation: Secondary | ICD-10-CM

## 2016-04-01 DIAGNOSIS — I5022 Chronic systolic (congestive) heart failure: Secondary | ICD-10-CM

## 2016-04-01 DIAGNOSIS — I4819 Other persistent atrial fibrillation: Secondary | ICD-10-CM

## 2016-04-01 DIAGNOSIS — I739 Peripheral vascular disease, unspecified: Secondary | ICD-10-CM | POA: Insufficient documentation

## 2016-04-01 DIAGNOSIS — I482 Chronic atrial fibrillation: Secondary | ICD-10-CM | POA: Insufficient documentation

## 2016-04-01 DIAGNOSIS — Z953 Presence of xenogenic heart valve: Secondary | ICD-10-CM | POA: Diagnosis not present

## 2016-04-01 DIAGNOSIS — Z7901 Long term (current) use of anticoagulants: Secondary | ICD-10-CM | POA: Insufficient documentation

## 2016-04-01 DIAGNOSIS — F17200 Nicotine dependence, unspecified, uncomplicated: Secondary | ICD-10-CM | POA: Diagnosis not present

## 2016-04-01 DIAGNOSIS — I428 Other cardiomyopathies: Secondary | ICD-10-CM | POA: Insufficient documentation

## 2016-04-01 DIAGNOSIS — Z79899 Other long term (current) drug therapy: Secondary | ICD-10-CM | POA: Diagnosis not present

## 2016-04-01 LAB — BASIC METABOLIC PANEL
ANION GAP: 7 (ref 5–15)
BUN: 18 mg/dL (ref 6–20)
CALCIUM: 9.5 mg/dL (ref 8.9–10.3)
CO2: 30 mmol/L (ref 22–32)
Chloride: 101 mmol/L (ref 101–111)
Creatinine, Ser: 1.2 mg/dL (ref 0.61–1.24)
Glucose, Bld: 104 mg/dL — ABNORMAL HIGH (ref 65–99)
Potassium: 4.4 mmol/L (ref 3.5–5.1)
Sodium: 138 mmol/L (ref 135–145)

## 2016-04-01 MED ORDER — CARVEDILOL 6.25 MG PO TABS: 9.3750 mg | ORAL_TABLET | Freq: Every evening | ORAL | 6 refills | Status: DC

## 2016-04-01 NOTE — Patient Instructions (Signed)
STOP Losartan.  INCREASE Carvedilol (Coreg) 9.375mg  (1.5 tablets) twice daily.  Routine lab work today. Will notify you of abnormal results, otherwise no news is good news!  Follow up 2 weeks with Amy Clegg NP-C.  Do the following things EVERYDAY: 1) Weigh yourself in the morning before breakfast. Write it down and keep it in a log. 2) Take your medicines as prescribed 3) Eat low salt foods-Limit salt (sodium) to 2000 mg per day.  4) Stay as active as you can everyday 5) Limit all fluids for the day to less than 2 liters
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
## 2016-04-15 ENCOUNTER — Inpatient Hospital Stay (HOSPITAL_COMMUNITY): Admission: RE | Admit: 2016-04-15 | Payer: Medicare Other | Source: Ambulatory Visit

## 2016-05-06 ENCOUNTER — Other Ambulatory Visit (HOSPITAL_COMMUNITY): Payer: Self-pay | Admitting: *Deleted

## 2016-05-06 MED ORDER — FUROSEMIDE 40 MG PO TABS: 40.0000 mg | ORAL_TABLET | Freq: Every day | ORAL | 3 refills | Status: DC

## 2016-05-13 ENCOUNTER — Ambulatory Visit (HOSPITAL_COMMUNITY)
Admission: RE | Admit: 2016-05-13 | Discharge: 2016-05-13 | Disposition: A | Discharge status: Home / Self Care | Payer: Medicare Other | Source: Ambulatory Visit | Attending: Cardiology | Admitting: Cardiology

## 2016-05-13 ENCOUNTER — Encounter (HOSPITAL_COMMUNITY): Payer: Self-pay

## 2016-05-13 VITALS — BP 126/72 | HR 84 | Wt 163.0 lb

## 2016-05-13 DIAGNOSIS — J449 Chronic obstructive pulmonary disease, unspecified: Secondary | ICD-10-CM | POA: Insufficient documentation

## 2016-05-13 DIAGNOSIS — Z952 Presence of prosthetic heart valve: Secondary | ICD-10-CM | POA: Insufficient documentation

## 2016-05-13 DIAGNOSIS — I255 Ischemic cardiomyopathy: Secondary | ICD-10-CM | POA: Insufficient documentation

## 2016-05-13 DIAGNOSIS — I110 Hypertensive heart disease with heart failure: Secondary | ICD-10-CM | POA: Diagnosis not present

## 2016-05-13 DIAGNOSIS — I481 Persistent atrial fibrillation: Secondary | ICD-10-CM | POA: Diagnosis not present

## 2016-05-13 DIAGNOSIS — Z7984 Long term (current) use of oral hypoglycemic drugs: Secondary | ICD-10-CM | POA: Diagnosis not present

## 2016-05-13 DIAGNOSIS — Z720 Tobacco use: Secondary | ICD-10-CM | POA: Diagnosis not present

## 2016-05-13 DIAGNOSIS — I2510 Atherosclerotic heart disease of native coronary artery without angina pectoris: Secondary | ICD-10-CM | POA: Insufficient documentation

## 2016-05-13 DIAGNOSIS — Z4502 Encounter for adjustment and management of automatic implantable cardiac defibrillator: Secondary | ICD-10-CM | POA: Diagnosis not present

## 2016-05-13 DIAGNOSIS — F1010 Alcohol abuse, uncomplicated: Secondary | ICD-10-CM | POA: Diagnosis not present

## 2016-05-13 DIAGNOSIS — I428 Other cardiomyopathies: Secondary | ICD-10-CM | POA: Insufficient documentation

## 2016-05-13 DIAGNOSIS — I426 Alcoholic cardiomyopathy: Secondary | ICD-10-CM | POA: Insufficient documentation

## 2016-05-13 DIAGNOSIS — I482 Chronic atrial fibrillation: Secondary | ICD-10-CM | POA: Insufficient documentation

## 2016-05-13 DIAGNOSIS — Z79899 Other long term (current) drug therapy: Secondary | ICD-10-CM | POA: Diagnosis not present

## 2016-05-13 DIAGNOSIS — I5022 Chronic systolic (congestive) heart failure: Secondary | ICD-10-CM | POA: Diagnosis not present

## 2016-05-13 DIAGNOSIS — Z7901 Long term (current) use of anticoagulants: Secondary | ICD-10-CM | POA: Diagnosis not present

## 2016-05-13 DIAGNOSIS — I4819 Other persistent atrial fibrillation: Secondary | ICD-10-CM

## 2016-05-13 DIAGNOSIS — F17210 Nicotine dependence, cigarettes, uncomplicated: Secondary | ICD-10-CM | POA: Diagnosis not present

## 2016-05-13 DIAGNOSIS — I739 Peripheral vascular disease, unspecified: Secondary | ICD-10-CM | POA: Diagnosis not present

## 2016-05-13 MED ORDER — CARVEDILOL 6.25 MG PO TABS: 12.5000 mg | ORAL_TABLET | Freq: Two times a day (BID) | ORAL | 6 refills | Status: DC

## 2016-05-13 MED ORDER — ATORVASTATIN CALCIUM 40 MG PO TABS: 40.0000 mg | ORAL_TABLET | Freq: Every day | ORAL | 0 refills | Status: DC
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
## 2016-05-13 NOTE — Patient Instructions (Signed)
Atorvastatin refilled to preferred pharmacy electronically.  INCREASE Carvedilol (Coreg) to 12.5 mg twice daily. You can "double up" with your current 6.25 mg tabelts at home (Take 2 tabs twice daily). New Rx has been sent in to your preferred pharmacy electronically for a 12.5 mg tablet once you run out (Take 1 tablet twice daily).  No lab work today.  Follow up 6 weeks with Amy Clegg NP-C.  Merry Christmas and happy New Year!  Do the following things EVERYDAY: 1) Weigh yourself in the morning before breakfast. Write it down and keep it in a log. 2) Take your medicines as prescribed 3) Eat low salt foods-Limit salt (sodium) to 2000 mg per day.  4) Stay as active as you can everyday 5) Limit all fluids for the day to less than 2 liters

## 2016-05-13 NOTE — Progress Notes (Signed)
Advanced Heart Failure Medication Review by a Pharmacist  Does the patient  feel that his/her medications are working for him/her?  yes  Has the patient been experiencing any side effects to the medications prescribed?  no  Does the patient measure his/her own blood pressure or blood glucose at home?  no   Does the patient have any problems obtaining medications due to transportation or finances?   no  Understanding of regimen: fair Understanding of indications: fair Potential of compliance: good Patient understands to avoid NSAIDs. Patient understands to avoid decongestants.  Issues to address at subsequent visits: None   Pharmacist comments: [name] is a pleasant 70 yo M presenting with his son and without a medication list. He reports good compliance with his regimen and did not have any specific medication-related questions or concerns for me at this time.   Tyler Deis. Bonnye Fava, PharmD, BCPS, CPP Clinical Pharmacist Pager: [phone] Phone: [phone] 05/13/2016 9:58 AM      Time with patient: 10 minutes Preparation and documentation time: 2 minutes Total time: 12 minutes

## 2016-06-03 IMAGING — CR DG CHEST 2V
2 series · 2 of 2 positions shown · non-contrast
Comparison: 05/22/2015

CLINICAL DATA: Left lower chest pain or shortness of breath for 2
days.

EXAM:
CHEST  2 VIEW

[chest pa]
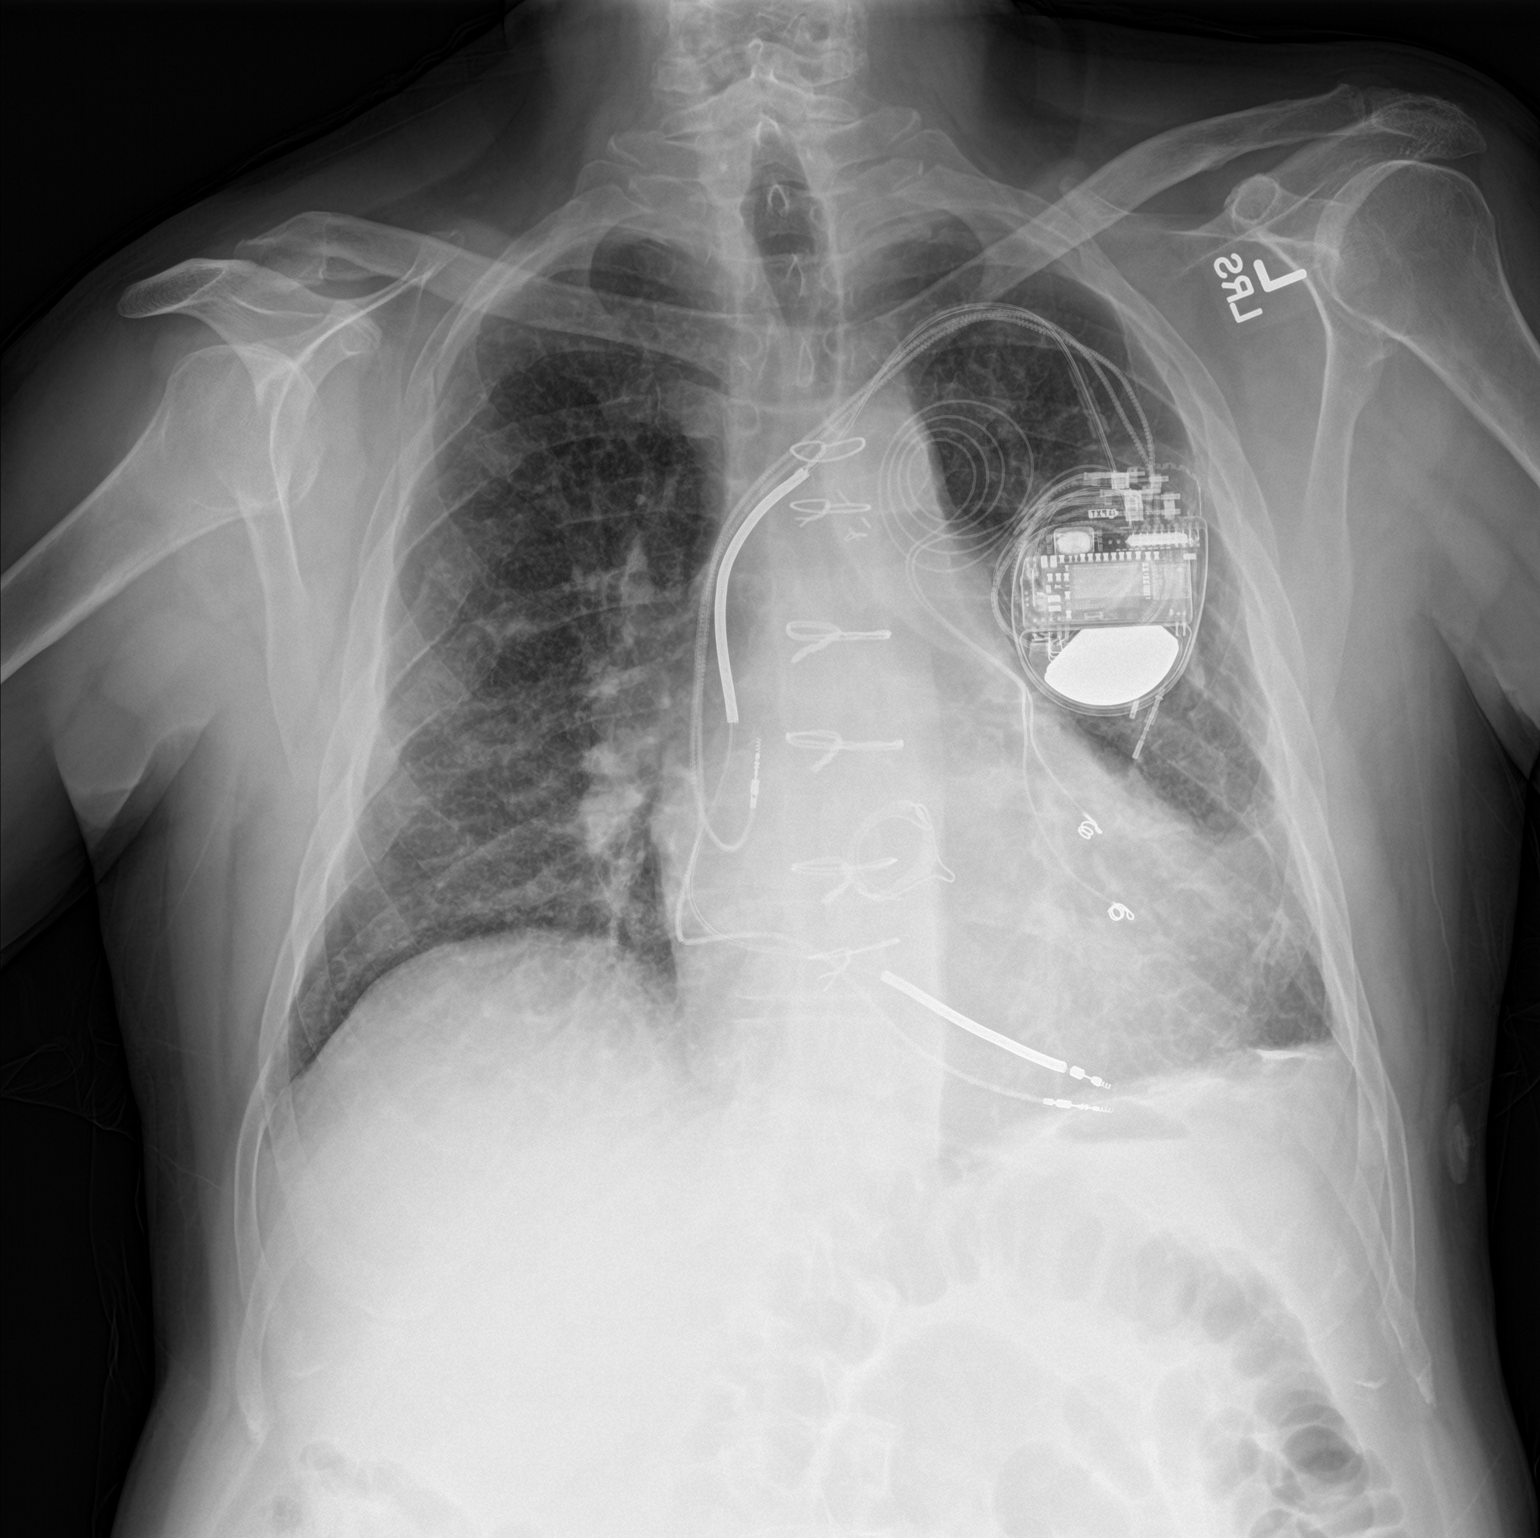

[chest lat]
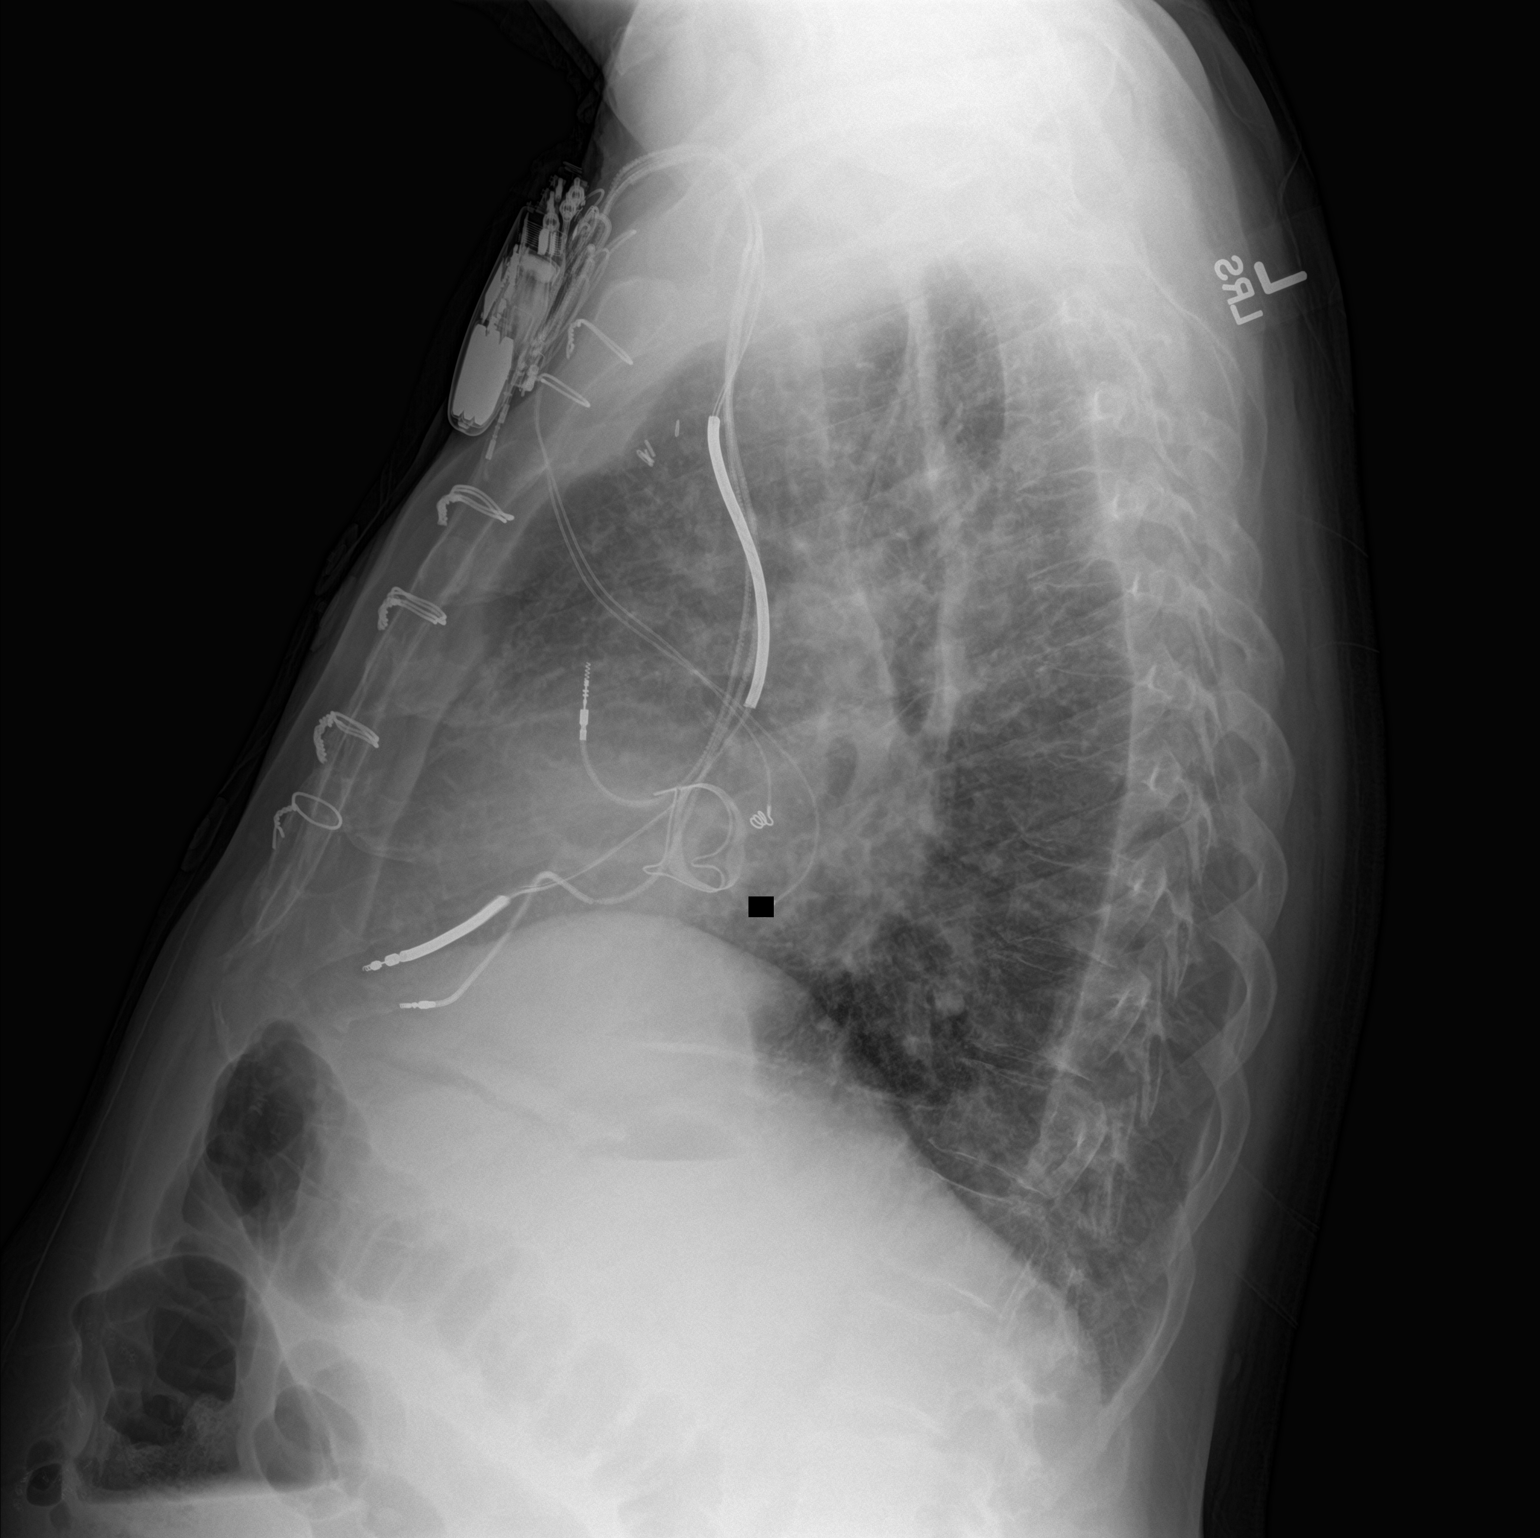

[2 of 2 positions shown; findings below may reference images not displayed]

FINDINGS: Pacemaker/ICD is again seen with leads projecting over the right
atrium and right ventricle. Epicardial leads are also noted.
Sequelae of prior mitral valve replacement are again identified. The
cardiac silhouette remains mildly enlarged. Peribronchial thickening
and mild diffuse interstitial prominence are unchanged. Chronic
blunting of the left costophrenic angle is unchanged, with calcified
pleural plaques again seen. There is no evidence of acute airspace
consolidation, overt edema, sizable pleural effusion, or
pneumothorax. Old right-sided rib fractures are noted.
IMPRESSION: Unchanged appearance of the chest. Chronic bronchitic changes
without evidence of acute cardiopulmonary process.

## 2016-06-24 ENCOUNTER — Encounter (HOSPITAL_COMMUNITY): Payer: Medicare Other

## 2016-07-06 ENCOUNTER — Ambulatory Visit (HOSPITAL_COMMUNITY)
Admission: EM | Admit: 2016-07-06 | Discharge: 2016-07-06 | Disposition: A | Discharge status: Home / Self Care | Payer: Medicare Other | Attending: Emergency Medicine | Admitting: Emergency Medicine

## 2016-07-06 ENCOUNTER — Encounter (HOSPITAL_COMMUNITY): Payer: Self-pay | Admitting: Emergency Medicine

## 2016-07-06 ENCOUNTER — Emergency Department (HOSPITAL_COMMUNITY)
Admission: EM | Admit: 2016-07-06 | Discharge: 2016-07-06 | Disposition: A | Discharge status: Home / Self Care | Payer: Medicare Other

## 2016-07-06 ENCOUNTER — Telehealth (HOSPITAL_COMMUNITY): Payer: Self-pay | Admitting: *Deleted

## 2016-07-06 ENCOUNTER — Ambulatory Visit (INDEPENDENT_AMBULATORY_CARE_PROVIDER_SITE_OTHER): Payer: Medicare Other | Admitting: *Deleted

## 2016-07-06 ENCOUNTER — Telehealth (HOSPITAL_COMMUNITY): Payer: Self-pay

## 2016-07-06 DIAGNOSIS — I255 Ischemic cardiomyopathy: Secondary | ICD-10-CM

## 2016-07-06 DIAGNOSIS — M6740 Ganglion, unspecified site: Secondary | ICD-10-CM | POA: Diagnosis not present

## 2016-07-06 NOTE — Telephone Encounter (Signed)
Opened in error

## 2016-07-06 NOTE — ED Triage Notes (Signed)
The patient presented to the [facility] with a complaint of right hand and wrist pain x 1 week. The patient denied any known injury.

## 2016-07-06 NOTE — Progress Notes (Signed)
Remote ICD transmission.   

## 2016-07-06 NOTE — Discharge Instructions (Signed)
You have a ganglion cyst that is compressing the ulnar nerve. We have splinted your wrist here. I recommend tylenol for pain relief. Should your symptoms fail to resolve, follow up with a hand specialist. I have provided the name of Dr. Amanda Pea who is a specialist in this area and may be able to help you with your symptoms.

## 2016-07-06 NOTE — Telephone Encounter (Signed)
Patient showed up to CHF clinic this morning with raised soft nodule golf ball size on wrist asking to be seen by CHF Dr. Shirlee Latch. Unable to feel fingers, or bend them.  Color WNL, extremity and warm to touch. Denies fever, chills, sweats.   Called PCP office this morning which advised him to go to ED. Spoke with Dr. Shirlee Latch via phone who states s/s sound like abscess/cyst. Per Dr. Shirlee Latch, advised patient to go to ED for eval and treatment. Aware and agreeable.  Ave Filter, RN

## 2016-07-06 NOTE — ED Provider Notes (Signed)
CSN: [id number]     Arrival date & time 07/06/16  1047 History   First MD Initiated Contact with Patient 07/06/16 1219     Chief Complaint  Patient presents with  . Hand Pain   (Consider location/radiation/quality/duration/timing/severity/associated sxs/prior Treatment) 71 year old male presents to clinic with two day history of swelling in his left wrist in numbness in the 4th and 5th fingers. He denies pain and denies traumatic cause. He has not taken any OTC therapies for his condition.   The history is provided by the patient.  Hand Pain     Past Medical History:  Diagnosis Date  . AICD (automatic cardioverter/defibrillator) present   . Alcohol abuse   . Anxiety   . Bipolar disorder (HCC)   . Biventricular automatic implantable cardioverter defibrillator in situ    a. 01/2014 s/p MDT [id number] Viva XT CRT-D (ser # [id number]).  . CAD (coronary artery disease)    a. s/p prior PCI/stenting of the LCX;  b. 08/2014 MV: EF 20%, large septal, apical, and inferior infarct from apex to base, no ischemia;  c. 08/2014 NSTEMI/Cath: LM mod distal dzs extending into ostial LCX (80%), LAD tortuous, RI nl, OM mod dzs, RCA 100 CTO with R->R and L->R collats-->Med Rx.  . Chronic atrial fibrillation (HCC)    a. CHA2DS2VASc = 6->eliquis;  b. S/P AVN RFCA an BiV ICD placement.  . Chronic respiratory failure (HCC)   . Chronic systolic congestive heart failure (HCC)    a. 01/2014 Echo: EF 25-30%, mod conc LVH, mod dil LA.  . CKD (chronic kidney disease)    a. suspect stage II-III based on historical labs.  . Complete heart block (HCC)    a. In setting of prior AV nodal ablation r/t afib-->BiV ICD (01/2014).  . COPD (chronic obstructive pulmonary disease) (HCC)   . CVA (cerebral vascular accident) (HCC)    a. Multiple prior embolic strokes.  . Dementia   . Depression   . DVT (deep venous thrombosis) (HCC)   . Dyslipidemia   . Hyperglycemia   . Hypertension   . Mitral valve disease    a. remote mitral  replacement with Bjork Shiley valve;  b. 06/2006 Redo MVR with tissue valve.  . Mixed Ischemic and Nonischemic Cardiomyopathy    a. 8/.2015 Echo: Ef 25-30%;  b. 01/2014 s/p MDT [id number] Talbot Grumbling XT CRT-D (ser # [id number]).  . On home oxygen therapy    "3L at night" (11/23/2015)  . Ventral hernia    Past Surgical History:  Procedure Laterality Date  . AV NODE ABLATION  2007  . CARDIAC CATHETERIZATION  06/2006   Mild ostial L main stenosis, CFX stent patent, RCA occluded (old)  . CORONARY ARTERY BYPASS GRAFT  2008  . CYSTOSCOPY W/ URETERAL STENT PLACEMENT  07/12/2011   Procedure: CYSTOSCOPY WITH RETROGRADE PYELOGRAM/URETERAL STENT PLACEMENT;  Surgeon: Sebastian Ache, MD;  Location: [facility] OR;  Service: Urology;  Laterality: Left;  . HERNIA REPAIR    . ICD GENERATOR CHANGE  02/01/2014   Gen change to: Medtronic VIVA pulse generator, serial number [id number]  . INSERT / REPLACE / REMOVE PACEMAKER    . LEFT HEART CATHETERIZATION WITH CORONARY ANGIOGRAM N/A 08/23/2014   Procedure: LEFT HEART CATHETERIZATION WITH CORONARY ANGIOGRAM;  Surgeon: Corky Crafts, MD;  Location: [facility] CATH LAB;  Service: Cardiovascular;  Laterality: N/A;  . MITRAL VALVE REPLACEMENT     remote [facility] valve 2952 with redo tissue valve 06/2006  . PACEMAKER GENERATOR CHANGE N/A 02/01/2014  Procedure: PACEMAKER GENERATOR CHANGE;  Surgeon: Duke Salvia, MD;  Location: [facility] CATH LAB;  Service: Cardiovascular;  Laterality: N/A;  . PACEMAKER PLACEMENT  2006   Changed to CRT-D in 2009   Family History  Problem Relation Age of Onset  . Alzheimer's disease Mother   . Heart attack Father   . Heart disease Father   . Heart attack Son   . Varicose Veins Son   . Deep vein thrombosis Son   . Heart disease Son   . Stroke    . Heart disease    . Heart disease Brother    Social History  Substance Use Topics  . Smoking status: Current Every Day Smoker    Packs/day: 1.00    Years: 42.00    Types: Cigarettes  . Smokeless tobacco:  Never Used  . Alcohol use 0.0 oz/week     Comment: 11/22/2015 "6 pack of beer/month"    Review of Systems  Reason unable to perform ROS: as covered in HPI.  All other systems reviewed and are negative.   Allergies  Warfarin  Home Medications   Prior to Admission medications   Medication Sig Start Date End Date Taking? Authorizing Provider  albuterol (PROAIR HFA) 108 (90 Base) MCG/ACT inhaler 2 puffs every 4 hours as needed only  if your can't catch your breath 03/31/16   Nyoka Cowden, MD  apixaban (ELIQUIS) 5 MG TABS tablet Take 5 mg by mouth 2 (two) times daily.    Historical Provider, MD  atorvastatin (LIPITOR) 40 MG tablet Take 1 tablet (40 mg total) by mouth daily. 05/13/16   Amy D Filbert Schilder, NP  carvedilol (COREG) 6.25 MG tablet Take 2 tablets (12.5 mg total) by mouth 2 (two) times daily with a meal. 05/13/16   Amy D Clegg, NP  digoxin (LANOXIN) 0.125 MG tablet Take 1 tablet (0.125 mg total) by mouth daily. 10/23/15   Graciella Freer, PA-C  fenofibrate (TRICOR) 48 MG tablet Take 1 tablet (48 mg total) by mouth daily. Reported on 05/28/2015 10/23/15   Graciella Freer, PA-C  furosemide (LASIX) 40 MG tablet Take 1 tablet (40 mg total) by mouth daily. 05/06/16   [name], MD  loratadine (CLARITIN) 10 MG tablet Take 10 mg by mouth daily as needed for allergies.    Historical Provider, MD  metFORMIN (GLUCOPHAGE) 500 MG tablet Take 500 mg by mouth daily with breakfast.    Historical Provider, MD  Multiple Vitamin (MULTIVITAMIN WITH MINERALS) TABS tablet Take 1 tablet by mouth daily. 05/24/15   Osvaldo Shipper, MD  nitroGLYCERIN (NITROSTAT) 0.4 MG SL tablet Place 1 tablet (0.4 mg total) under the tongue every 5 (five) minutes as needed for chest pain. Patient not taking: Reported on 05/13/2016 01/06/15   Brittainy M Sharol Harness, PA-C  ranitidine (ZANTAC) 150 MG tablet Take 150 mg by mouth daily as needed for heartburn.    Historical Provider, MD  sacubitril-valsartan (ENTRESTO)  24-26 MG Take 1 tablet by mouth 2 (two) times daily. 03/10/16   Jennifer Chahn-Yang Choi, DO  spironolactone (ALDACTONE) 25 MG tablet Take 25 mg by mouth daily.    Historical Provider, MD  thiamine 100 MG tablet Take 1 tablet (100 mg total) by mouth daily. 05/24/15   Osvaldo Shipper, MD   Meds Ordered and Administered this Visit  Medications - No data to display  BP 159/69 (BP Location: Right Arm)   Pulse 65   Temp 97.8 F (36.6 C) (Oral)   Resp 18  SpO2 96%  No data found.   Physical Exam  Constitutional: He is oriented to person, place, and time. He appears well-developed and well-nourished. No distress.  HENT:  Head: Normocephalic and atraumatic.  Musculoskeletal:       Hands: Neurological: He is alert and oriented to person, place, and time.  Skin: Skin is warm and dry. Capillary refill takes less than 2 seconds. He is not diaphoretic.  Psychiatric: He has a normal mood and affect.  Nursing note and vitals reviewed.   Urgent Care Course     Procedures (including critical care time)  Labs Review Labs Reviewed - No data to display  Imaging Review No results found.   Visual Acuity Review  Right Eye Distance:   Left Eye Distance:   Bilateral Distance:    Right Eye Near:   Left Eye Near:    Bilateral Near:         MDM   1. Ganglion cyst   You have a ganglion cyst that is compressing the ulnar nerve. We have splinted your wrist here. I recommend tylenol for pain relief. Should your symptoms fail to resolve, follow up with a hand specialist. I have provided the name of Dr. Amanda Pea who is a specialist in this area and may be able to help you with your symptoms.       Dorena Bodo, NP 07/06/16 [phone]

## 2016-07-07 ENCOUNTER — Encounter: Payer: Self-pay | Admitting: Cardiology

## 2016-07-07 LAB — CUP PACEART REMOTE DEVICE CHECK
Battery Remaining Longevity: 51 mo
Battery Voltage: 2.96 V
Brady Statistic AP VS Percent: 0 %
Brady Statistic RA Percent Paced: 2.44 %
Brady Statistic RV Percent Paced: 96.05 %
HighPow Impedance: 44 Ohm
HighPow Impedance: 56 Ohm
Implantable Lead Implant Date: 20060307
Implantable Lead Implant Date: 20090617
Implantable Lead Location: 753859
Implantable Lead Location: 753860
Implantable Lead Model: 5071
Implantable Lead Model: 5076
Implantable Pulse Generator Implant Date: 20150827
Lead Channel Impedance Value: 4047 Ohm
Lead Channel Impedance Value: 570 Ohm
Lead Channel Pacing Threshold Amplitude: 1 V
Lead Channel Pacing Threshold Pulse Width: 0.4 ms
Lead Channel Pacing Threshold Pulse Width: 0.4 ms
Lead Channel Sensing Intrinsic Amplitude: 1.25 mV
Lead Channel Sensing Intrinsic Amplitude: 1.25 mV
Lead Channel Sensing Intrinsic Amplitude: 24.875 mV
Lead Channel Sensing Intrinsic Amplitude: 24.875 mV
Lead Channel Setting Pacing Amplitude: 2.5 V
Lead Channel Setting Pacing Pulse Width: 0.4 ms
Lead Channel Setting Pacing Pulse Width: 0.4 ms
Lead Channel Setting Sensing Sensitivity: 0.45 mV
MDC IDC LEAD IMPLANT DT: 20080110
MDC IDC LEAD LOCATION: 753858
MDC IDC MSMT LEADCHNL LV IMPEDANCE VALUE: 399 Ohm
MDC IDC MSMT LEADCHNL LV IMPEDANCE VALUE: 4047 Ohm
MDC IDC MSMT LEADCHNL LV PACING THRESHOLD AMPLITUDE: 1.25 V
MDC IDC MSMT LEADCHNL RA PACING THRESHOLD AMPLITUDE: 0.625 V
MDC IDC MSMT LEADCHNL RA PACING THRESHOLD PULSEWIDTH: 0.4 ms
MDC IDC MSMT LEADCHNL RV IMPEDANCE VALUE: 494 Ohm
MDC IDC MSMT LEADCHNL RV IMPEDANCE VALUE: 513 Ohm
MDC IDC SESS DTM: 20180129040809
MDC IDC SET LEADCHNL RA PACING AMPLITUDE: 1.5 V
MDC IDC SET LEADCHNL RV PACING AMPLITUDE: 2.5 V
MDC IDC STAT BRADY AP VP PERCENT: 2.44 %
MDC IDC STAT BRADY AS VP PERCENT: 95.55 %
MDC IDC STAT BRADY AS VS PERCENT: 2.01 %

## 2016-07-17 ENCOUNTER — Encounter: Payer: Medicare Other | Admitting: Internal Medicine

## 2016-07-19 NOTE — Progress Notes (Deleted)
Cardiology Office Note Date:  07/19/2016  Patient ID:  Dominic, [name] 07-30-1945, MRN [id number] PCP:  PROVIDER NOT IN SYSTEM  Cardiologist:  Dr/. McLean Electrophysiologist: Dr. Ladona Ridgel  ***refresh   Chief Complaint: past de device visit  History of Present Illness: [name] is a 71 y.o. male with history of permanent AF w/AVNode ablation, CRT-D, CVA, COPD (ongoing smoker), CAD (managed medically s/p cath March 2016), Hx of ETOH abuse, VHD s/p MVR(Bjork-Shiley in 1982, then redo with tissue mitral valve in 1/08), Bipolar disorder, Mixed CM w/chronic CHF, CRI, HTN, HLD, PVD b/l LE.  He comes to the office to be seen for Dr. Ladona Ridgel, following closely with CHF team, last seen by them in Dec, at that time seemed to be compensated fairly well, medicines titrated.   The last time he saw Dr. Ladona Ridgel was April 2016, and comes today for an overdue EP/ICD in-clinic visit  *** fluid status *** bleeding/eliquis ??? Not warfarin w/VHD?? *** meds *** + remotes *** ?smoking  Device information MDT CRT-D--- RA lead 2006, LV lead 2008, RV lead 2009, last generator 02/01/14  Past Medical History:  Diagnosis Date  . AICD (automatic cardioverter/defibrillator) present   . Alcohol abuse   . Anxiety   . Bipolar disorder (HCC)   . Biventricular automatic implantable cardioverter defibrillator in situ    a. 01/2014 s/p MDT DTBA1D1 Viva XT CRT-D (ser # [id number]).  . CAD (coronary artery disease)    a. s/p prior PCI/stenting of the LCX;  b. 08/2014 MV: EF 20%, large septal, apical, and inferior infarct from apex to base, no ischemia;  c. 08/2014 NSTEMI/Cath: LM mod distal dzs extending into ostial LCX (80%), LAD tortuous, RI nl, OM mod dzs, RCA 100 CTO with R->R and L->R collats-->Med Rx.  . Chronic atrial fibrillation (HCC)    a. CHA2DS2VASc = 6->eliquis;  b. S/P AVN RFCA an BiV ICD placement.  . Chronic respiratory failure (HCC)   . Chronic systolic congestive heart failure (HCC)    a. 01/2014  Echo: EF 25-30%, mod conc LVH, mod dil LA.  . CKD (chronic kidney disease)    a. suspect stage II-III based on historical labs.  . Complete heart block (HCC)    a. In setting of prior AV nodal ablation r/t afib-->BiV ICD (01/2014).  . COPD (chronic obstructive pulmonary disease) (HCC)   . CVA (cerebral vascular accident) (HCC)    a. Multiple prior embolic strokes.  . Dementia   . Depression   . DVT (deep venous thrombosis) (HCC)   . Dyslipidemia   . Hyperglycemia   . Hypertension   . Mitral valve disease    a. remote mitral replacement with Bjork Shiley valve;  b. 06/2006 Redo MVR with tissue valve.  . Mixed Ischemic and Nonischemic Cardiomyopathy    a. 8/.2015 Echo: Ef 25-30%;  b. 01/2014 s/p MDT [id number] Talbot Grumbling XT CRT-D (ser # [id number]).  . On home oxygen therapy    "3L at night" (11/23/2015)  . Ventral hernia     Past Surgical History:  Procedure Laterality Date  . AV NODE ABLATION  2007  . CARDIAC CATHETERIZATION  06/2006   Mild ostial L main stenosis, CFX stent patent, RCA occluded (old)  . CORONARY ARTERY BYPASS GRAFT  2008  . CYSTOSCOPY W/ URETERAL STENT PLACEMENT  07/12/2011   Procedure: CYSTOSCOPY WITH RETROGRADE PYELOGRAM/URETERAL STENT PLACEMENT;  Surgeon: Sebastian Ache, MD;  Location: [facility] OR;  Service: Urology;  Laterality: Left;  . HERNIA REPAIR    .  ICD GENERATOR CHANGE  02/01/2014   Gen change to: Medtronic VIVA pulse generator, serial number [id number]  . INSERT / REPLACE / REMOVE PACEMAKER    . LEFT HEART CATHETERIZATION WITH CORONARY ANGIOGRAM N/A 08/23/2014   Procedure: LEFT HEART CATHETERIZATION WITH CORONARY ANGIOGRAM;  Surgeon: Corky Crafts, MD;  Location: [facility] CATH LAB;  Service: Cardiovascular;  Laterality: N/A;  . MITRAL VALVE REPLACEMENT     remote [facility] valve 0165 with redo tissue valve 06/2006  . PACEMAKER GENERATOR CHANGE N/A 02/01/2014   Procedure: PACEMAKER GENERATOR CHANGE;  Surgeon: Duke Salvia, MD;  Location: [facility] CATH LAB;  Service:  Cardiovascular;  Laterality: N/A;  . PACEMAKER PLACEMENT  2006   Changed to CRT-D in 2009    Current Outpatient Prescriptions  Medication Sig Dispense Refill  . albuterol (PROAIR HFA) 108 (90 Base) MCG/ACT inhaler 2 puffs every 4 hours as needed only  if your can't catch your breath 1 Inhaler 1  . apixaban (ELIQUIS) 5 MG TABS tablet Take 5 mg by mouth 2 (two) times daily.    Marland Kitchen atorvastatin (LIPITOR) 40 MG tablet Take 1 tablet (40 mg total) by mouth daily. 30 tablet 0  . carvedilol (COREG) 6.25 MG tablet Take 2 tablets (12.5 mg total) by mouth 2 (two) times daily with a meal. 60 tablet 6  . digoxin (LANOXIN) 0.125 MG tablet Take 1 tablet (0.125 mg total) by mouth daily. 90 tablet 3  . fenofibrate (TRICOR) 48 MG tablet Take 1 tablet (48 mg total) by mouth daily. Reported on 05/28/2015 90 tablet 3  . furosemide (LASIX) 40 MG tablet Take 1 tablet (40 mg total) by mouth daily. 90 tablet 3  . loratadine (CLARITIN) 10 MG tablet Take 10 mg by mouth daily as needed for allergies.    . metFORMIN (GLUCOPHAGE) 500 MG tablet Take 500 mg by mouth daily with breakfast.    . Multiple Vitamin (MULTIVITAMIN WITH MINERALS) TABS tablet Take 1 tablet by mouth daily. 30 tablet 2  . nitroGLYCERIN (NITROSTAT) 0.4 MG SL tablet Place 1 tablet (0.4 mg total) under the tongue every 5 (five) minutes as needed for chest pain. (Patient not taking: Reported on 05/13/2016) 25 tablet 2  . ranitidine (ZANTAC) 150 MG tablet Take 150 mg by mouth daily as needed for heartburn.    . sacubitril-valsartan (ENTRESTO) 24-26 MG Take 1 tablet by mouth 2 (two) times daily. 60 tablet 0  . spironolactone (ALDACTONE) 25 MG tablet Take 25 mg by mouth daily.    Marland Kitchen thiamine 100 MG tablet Take 1 tablet (100 mg total) by mouth daily. 30 tablet 0   No current facility-administered medications for this visit.     Allergies:   Warfarin   Social History:  The patient  reports that he has been smoking Cigarettes.  He has a 42.00 pack-year smoking  history. He has never used smokeless tobacco. He reports that he drinks alcohol. He reports that he does not use drugs.   Family History:  The patient's family history includes Alzheimer's disease in his mother; Deep vein thrombosis in his son; Heart attack in his father and son; Heart disease in his brother, father, and son; Varicose Veins in his son.  ROS:  Please see the history of present illness.    All other systems are reviewed and otherwise negative.   PHYSICAL EXAM: *** VS:  There were no vitals taken for this visit. BMI: There is no height or weight on file to calculate BMI. Well nourished, well  developed, in no acute distress  HEENT: normocephalic, atraumatic  Neck: no JVD, carotid bruits or masses Cardiac:  ***; no significant murmurs, no rubs, or gallops Lungs:  *** no wheezing, rhonchi or rales  Abd: soft, nontender MS: no deformity or atrophy Ext: *** edema  Skin: warm and dry, no rash Neuro:  No gross deficits appreciated Psych: euthymic mood, full affect  *** ICD site is stable, no tethering or discomfort   EKG:  Done 03/02/16 V paced ICD interrogation done today and reviewed by myself: ***  03/05/16: TTE Study Conclusions - Left ventricle: The cavity size was mildly dilated. Wall   thickness was normal. Systolic function was moderately to   severely reduced. The estimated ejection fraction was in the   range of 30% to 35%. Hypokinesis of the inferolateral and   inferior myocardium. Hypokinesis of the apical myocardium. The   study is not technically sufficient to allow evaluation of LV   diastolic function. - Ventricular septum: Septal motion showed paradox. These changes   are consistent with right ventricular pacing. - Mitral valve: A bioprosthesis was present and functioning   normally. Valve area by pressure half-time: 1.89 cm^2. Valve area   by continuity equation (using LVOT flow): 0.87 cm^2. - Left atrium: The atrium was moderately dilated. - Right  ventricle: Systolic function was moderately reduced.    Recent Labs: 03/02/2016: ALT 34 03/03/2016: Magnesium 1.9 03/05/2016: Hemoglobin 13.1; Platelets 180 03/18/2016: B Natriuretic Peptide 105.8 04/01/2016: BUN 18; Creatinine, Ser 1.20; Potassium 4.4; Sodium 138  03/06/2016: Cholesterol 175; HDL 37; LDL Cholesterol 107; Total CHOL/HDL Ratio 4.7; Triglycerides 155; VLDL 31   CrCl cannot be calculated (Patient's most recent lab result is older than the maximum 21 days allowed.).   Wt Readings from Last 3 Encounters:  05/13/16 163 lb (73.9 kg)  04/01/16 163 lb (73.9 kg)  03/31/16 163 lb 9.6 oz (74.2 kg)     Other studies reviewed: Additional studies/records reviewed today include: summarized above  ASSESSMENT AND PLAN:  1. CM w/ICD     *** fluid status   On BB, ARB, dig, spironolactone, lasix  2. Permanent AFib    S/p AV node ablation    CHA2DS2Vasc is 6, on Eliquis  3. CAD     *** no CP     C/w Dr. Shirlee Latch     On BB, statin, fenofibrate  4. HTN     ***  5. COPD     *** still smoking     *** counseled  Disposition: F/u with ***  Current medicines are reviewed at length with the patient today.  The patient did not have any concerns regarding medicines.***  Signed, Sherrilee Gilles, PA-C 07/19/2016 8:22 AM     [facility] [address] [place] [address] [phone] (office)  [phone] (fax)

## 2016-07-20 ENCOUNTER — Encounter: Payer: Medicare Other | Admitting: Physician Assistant

## 2016-07-22 ENCOUNTER — Ambulatory Visit (INDEPENDENT_AMBULATORY_CARE_PROVIDER_SITE_OTHER): Payer: Medicare Other | Admitting: Physician Assistant

## 2016-07-22 ENCOUNTER — Encounter (INDEPENDENT_AMBULATORY_CARE_PROVIDER_SITE_OTHER): Payer: Self-pay

## 2016-07-22 VITALS — BP 166/90 | HR 60 | Ht 67.0 in | Wt 170.1 lb

## 2016-07-22 DIAGNOSIS — I482 Chronic atrial fibrillation: Secondary | ICD-10-CM | POA: Diagnosis not present

## 2016-07-22 DIAGNOSIS — I10 Essential (primary) hypertension: Secondary | ICD-10-CM

## 2016-07-22 DIAGNOSIS — I5022 Chronic systolic (congestive) heart failure: Secondary | ICD-10-CM

## 2016-07-22 DIAGNOSIS — Z95810 Presence of automatic (implantable) cardiac defibrillator: Secondary | ICD-10-CM

## 2016-07-22 DIAGNOSIS — I4821 Permanent atrial fibrillation: Secondary | ICD-10-CM

## 2016-07-22 DIAGNOSIS — F17200 Nicotine dependence, unspecified, uncomplicated: Secondary | ICD-10-CM

## 2016-07-22 NOTE — Progress Notes (Signed)
Cardiology Office Note Date:  07/19/2016  Patient ID:  Dominic, [name] 08/19/1945, MRN [id number] PCP:  PROVIDER NOT IN SYSTEM  Cardiologist:  Dr/. McLean Electrophysiologist: Dr. Ladona Ridgel    Chief Complaint: past due device visit  History of Present Illness: [name] is a 71 y.o. male with history of permanent AF w/AVNode ablation, CRT-D, CVA, COPD (ongoing smoker), CAD (managed medically s/p cath March 2016), Hx of ETOH abuse, VHD s/p MVR(Bjork-Shiley in 1982, then redo with tissue mitral valve in 1/08), Bipolar disorder, Mixed CM w/chronic CHF, CRI, HTN, HLD, PVD b/l LE.  He comes to the office to be seen for Dr. Ladona Ridgel, following closely with CHF team, last seen by them in Dec, at that time seemed to be compensated fairly well, medicines titrated.   The last time he saw Dr. Ladona Ridgel was April 2016, and comes today for an overdue EP/ICD in-clinic visit.  He is accompanied by his daughter who he lives with.  They both feel like he is doing well, unfortunately still smoking "a lot" per the daughter.  We discussed this though he is not interested in quitting, says he will cut back.  He denies any kind of CP, palpitations, no rest SOB, occasionally has some DOE, but denies symptoms of PND or orthopnea. No dizziness, near syncope or syncope, no shocks from his device.  He denies any bleeding or signs of bleeding.  He mentions he may need wrist surgery on the left, unclear exactly what the problem is, I note a ER visist with ganglion cyst, he will likely require cardiac clearance, they see Dr. Shirlee Latch next week, I will defer this to him.   Device information MDT CRT-D--- RA lead 2006, LV lead 2008, RV lead 2009, last generator 02/01/14      Past Medical History:  Diagnosis Date  . AICD (automatic cardioverter/defibrillator) present   . Alcohol abuse   . Anxiety   . Bipolar disorder (HCC)   . Biventricular automatic implantable cardioverter defibrillator in situ    a.  01/2014 s/p MDT [id number] Viva XT CRT-D (ser # [id number]).  . CAD (coronary artery disease)    a. s/p prior PCI/stenting of the LCX;  b. 08/2014 MV: EF 20%, large septal, apical, and inferior infarct from apex to base, no ischemia;  c. 08/2014 NSTEMI/Cath: LM mod distal dzs extending into ostial LCX (80%), LAD tortuous, RI nl, OM mod dzs, RCA 100 CTO with R->R and L->R collats-->Med Rx.  . Chronic atrial fibrillation (HCC)    a. CHA2DS2VASc = 6->eliquis;  b. S/P AVN RFCA an BiV ICD placement.  . Chronic respiratory failure (HCC)   . Chronic systolic congestive heart failure (HCC)    a. 01/2014 Echo: EF 25-30%, mod conc LVH, mod dil LA.  . CKD (chronic kidney disease)    a. suspect stage II-III based on historical labs.  . Complete heart block (HCC)    a. In setting of prior AV nodal ablation r/t afib-->BiV ICD (01/2014).  . COPD (chronic obstructive pulmonary disease) (HCC)   . CVA (cerebral vascular accident) (HCC)    a. Multiple prior embolic strokes.  . Dementia   . Depression   . DVT (deep venous thrombosis) (HCC)   . Dyslipidemia   . Hyperglycemia   . Hypertension   . Mitral valve disease    a. remote mitral replacement with Bjork Shiley valve;  b. 06/2006 Redo MVR with tissue valve.  . Mixed Ischemic and Nonischemic Cardiomyopathy  a. 8/.2015 Echo: Ef 25-30%;  b. 01/2014 s/p MDT [id number] [place] Grumbling XT CRT-D (ser # [id number]).  . On home oxygen therapy    "3L at night" (11/23/2015)  . Ventral hernia          Past Surgical History:  Procedure Laterality Date  . AV NODE ABLATION  2007  . CARDIAC CATHETERIZATION  06/2006   Mild ostial L main stenosis, CFX stent patent, RCA occluded (old)  . CORONARY ARTERY BYPASS GRAFT  2008  . CYSTOSCOPY W/ URETERAL STENT PLACEMENT  07/12/2011   Procedure: CYSTOSCOPY WITH RETROGRADE PYELOGRAM/URETERAL STENT PLACEMENT;  Surgeon: Sebastian Ache, MD;  Location: [facility] OR;  Service: Urology;  Laterality: Left;  . HERNIA REPAIR    .  ICD GENERATOR CHANGE  02/01/2014   Gen change to: Medtronic VIVA pulse generator, serial number [id number]  . INSERT / REPLACE / REMOVE PACEMAKER    . LEFT HEART CATHETERIZATION WITH CORONARY ANGIOGRAM N/A 08/23/2014   Procedure: LEFT HEART CATHETERIZATION WITH CORONARY ANGIOGRAM;  Surgeon: Corky Crafts, MD;  Location: [facility] CATH LAB;  Service: Cardiovascular;  Laterality: N/A;  . MITRAL VALVE REPLACEMENT     remote [facility] valve [id number] with redo tissue valve 06/2006  . PACEMAKER GENERATOR CHANGE N/A 02/01/2014   Procedure: PACEMAKER GENERATOR CHANGE;  Surgeon: Duke Salvia, MD;  Location: [facility] CATH LAB;  Service: Cardiovascular;  Laterality: N/A;  . PACEMAKER PLACEMENT  2006   Changed to CRT-D in 2009          Current Outpatient Prescriptions  Medication Sig Dispense Refill  . albuterol (PROAIR HFA) 108 (90 Base) MCG/ACT inhaler 2 puffs every 4 hours as needed only  if your can't catch your breath 1 Inhaler 1  . apixaban (ELIQUIS) 5 MG TABS tablet Take 5 mg by mouth 2 (two) times daily.    Marland Kitchen atorvastatin (LIPITOR) 40 MG tablet Take 1 tablet (40 mg total) by mouth daily. 30 tablet 0  . carvedilol (COREG) 6.25 MG tablet Take 2 tablets (12.5 mg total) by mouth 2 (two) times daily with a meal. 60 tablet 6  . digoxin (LANOXIN) 0.125 MG tablet Take 1 tablet (0.125 mg total) by mouth daily. 90 tablet 3  . fenofibrate (TRICOR) 48 MG tablet Take 1 tablet (48 mg total) by mouth daily. Reported on 05/28/2015 90 tablet 3  . furosemide (LASIX) 40 MG tablet Take 1 tablet (40 mg total) by mouth daily. 90 tablet 3  . loratadine (CLARITIN) 10 MG tablet Take 10 mg by mouth daily as needed for allergies.    . metFORMIN (GLUCOPHAGE) 500 MG tablet Take 500 mg by mouth daily with breakfast.    . Multiple Vitamin (MULTIVITAMIN WITH MINERALS) TABS tablet Take 1 tablet by mouth daily. 30 tablet 2  . nitroGLYCERIN (NITROSTAT) 0.4 MG SL tablet Place 1 tablet (0.4 mg total) under the tongue  every 5 (five) minutes as needed for chest pain. (Patient not taking: Reported on 05/13/2016) 25 tablet 2  . ranitidine (ZANTAC) 150 MG tablet Take 150 mg by mouth daily as needed for heartburn.    . sacubitril-valsartan (ENTRESTO) 24-26 MG Take 1 tablet by mouth 2 (two) times daily. 60 tablet 0  . spironolactone (ALDACTONE) 25 MG tablet Take 25 mg by mouth daily.    Marland Kitchen thiamine 100 MG tablet Take 1 tablet (100 mg total) by mouth daily. 30 tablet 0   No current facility-administered medications for this visit.     Allergies:   Warfarin   Social History:  The patient  reports that he has been smoking Cigarettes.  He has a 42.00 pack-year smoking history. He has never used smokeless tobacco. He reports that he drinks alcohol. He reports that he does not use drugs.   Family History:  The patient's family history includes Alzheimer's disease in his mother; Deep vein thrombosis in his son; Heart attack in his father and son; Heart disease in his brother, father, and son; Varicose Veins in his son.  ROS:  Please see the history of present illness.    All other systems are reviewed and otherwise negative.   PHYSICAL EXAM:  VS:  166/90, weight 170lbs Well nourished, well developed, in no acute distress  HEENT: normocephalic, atraumatic  Neck: no JVD, carotid bruits or masses Cardiac:  RRR (paced); soft SM, no rubs, or gallops Lungs:  no wheezing, rhonchi or rales  Abd: soft, nontender MS: no deformity or atrophy Ext: trace if any edema is appreciated Skin: warm and dry, no rash Neuro:  No gross deficits appreciated Psych: euthymic mood, full affect  ICD site is stable, no tethering or discomfort   EKG:  Done 03/02/16 V paced ICD interrogation done today and reviewed by myself: battery and lead measurements are stable.  He is 100% AF the entire year.  No V arrhythmias or observations, 96.1% V pacing  03/05/16: TTE Study Conclusions - Left ventricle: The cavity size was  mildly dilated. Wall thickness was normal. Systolic function was moderately to severely reduced. The estimated ejection fraction was in the range of 30% to 35%. Hypokinesis of the inferolateral and inferior myocardium. Hypokinesis of the apical myocardium. The study is not technically sufficient to allow evaluation of LV diastolic function. - Ventricular septum: Septal motion showed paradox. These changes are consistent with right ventricular pacing. - Mitral valve: A bioprosthesis was present and functioning normally. Valve area by pressure half-time: 1.89 cm^2. Valve area by continuity equation (using LVOT flow): 0.87 cm^2. - Left atrium: The atrium was moderately dilated. - Right ventricle: Systolic function was moderately reduced.    Recent Labs: 03/02/2016: ALT 34 03/03/2016: Magnesium 1.9 03/05/2016: Hemoglobin 13.1; Platelets 180 03/18/2016: B Natriuretic Peptide 105.8 04/01/2016: BUN 18; Creatinine, Ser 1.20; Potassium 4.4; Sodium 138  03/06/2016: Cholesterol 175; HDL 37; LDL Cholesterol 107; Total CHOL/HDL Ratio 4.7; Triglycerides 155; VLDL 31   CrCl cannot be calculated (Patient's most recent lab result is older than the maximum 21 days allowed.).      Wt Readings from Last 3 Encounters:  05/13/16 163 lb (73.9 kg)  04/01/16 163 lb (73.9 kg)  03/31/16 163 lb 9.6 oz (74.2 kg)     Other studies reviewed: Additional studies/records reviewed today include: summarized above  ASSESSMENT AND PLAN:  1. CM w/ICD     his weight is up by our scale, they have become a little lax doing home weights.  His home dry weight 161-163, his Optivol is below threshold but on the rise, his exam is negative for fluid OL     They have instructions to take an additional lasix for fluid collection from Dr. Shirlee Latch, recommend today taking an additional dose, and tomorrow, pending his AM weight and follow  their instructions from Dr. Shirlee Latch     They will get back to daily  weights     On BB, ARB, dig, spironolactone, lasix      03/05/16: Dig level 0.5  ICD with stable function, programmed VVIR    2. Permanent AFib    S/p AV node ablation  CHA2DS2Vasc is 6, he has been maintained on Eliquis.  Unclear if was on Warfarin historically    04/01/16: Creat 1.20      03/06/16: H/H 13/44  3. CAD     no CP     C/w Dr. Shirlee Latch     On BB, statin, fenofibrate  4. HTN     high today but did not take his morning medicines except for his Lasix.  5. COPD     still smoking     counseled, but mentions he worked for the [employer] all his life, has smoked all his life with no plans to stop     Agreeable to cut back at least  Disposition: F/u with 3 month remote device check, Dr. Shirlee Latch as scheduled, dr. Ladona Ridgel in 1 year, sooner if needed.  Current medicines are reviewed at length with the patient today.  The patient did not have any concerns regarding medicines.  Judith Blonder, PA-C 07/19/2016 8:22 AM     [facility] [address] [place] [address] [phone] (office)  [phone] (fax)

## 2016-07-22 NOTE — Patient Instructions (Signed)
Medication Instructions:  Your physician recommends that you continue on your current medications as directed. Please refer to the Current Medication list given to you today.   Labwork: -None  Testing/Procedures: -None  Follow-Up: Your physician wants you to follow-up in: 1 year f/u with Dr. Ladona Ridgel.  You will receive a reminder letter in the mail two months in advance. If you don't receive a letter, please call our office to schedule the follow-up appointment.  Keep follow up for device transmission.  Any Other Special Instructions Will Be Listed Below (If Applicable).     If you need a refill on your cardiac medications before your next appointment, please call your pharmacy.

## 2016-07-23 ENCOUNTER — Other Ambulatory Visit: Payer: Self-pay | Admitting: Cardiology

## 2016-07-23 LAB — CUP PACEART INCLINIC DEVICE CHECK
Date Time Interrogation Session: 20180215174022
Implantable Lead Implant Date: 20060307
Implantable Lead Implant Date: 20090617
Implantable Lead Location: 753858
Implantable Lead Location: 753860
Implantable Lead Model: 5076
Implantable Pulse Generator Implant Date: 20150827
MDC IDC LEAD IMPLANT DT: 20080110
MDC IDC LEAD LOCATION: 753859

## 2016-07-28 ENCOUNTER — Ambulatory Visit (HOSPITAL_COMMUNITY)
Admission: RE | Admit: 2016-07-28 | Discharge: 2016-07-28 | Disposition: A | Discharge status: Home / Self Care | Payer: Medicare Other | Source: Ambulatory Visit | Attending: Cardiology | Admitting: Cardiology

## 2016-07-28 ENCOUNTER — Encounter (HOSPITAL_COMMUNITY): Payer: Self-pay

## 2016-07-28 VITALS — BP 176/92 | HR 81 | Wt 168.0 lb

## 2016-07-28 DIAGNOSIS — I482 Chronic atrial fibrillation, unspecified: Secondary | ICD-10-CM

## 2016-07-28 DIAGNOSIS — I739 Peripheral vascular disease, unspecified: Secondary | ICD-10-CM | POA: Insufficient documentation

## 2016-07-28 DIAGNOSIS — J449 Chronic obstructive pulmonary disease, unspecified: Secondary | ICD-10-CM | POA: Diagnosis not present

## 2016-07-28 DIAGNOSIS — I5022 Chronic systolic (congestive) heart failure: Secondary | ICD-10-CM | POA: Insufficient documentation

## 2016-07-28 DIAGNOSIS — I110 Hypertensive heart disease with heart failure: Secondary | ICD-10-CM | POA: Insufficient documentation

## 2016-07-28 DIAGNOSIS — Z7984 Long term (current) use of oral hypoglycemic drugs: Secondary | ICD-10-CM | POA: Insufficient documentation

## 2016-07-28 DIAGNOSIS — I426 Alcoholic cardiomyopathy: Secondary | ICD-10-CM | POA: Diagnosis not present

## 2016-07-28 DIAGNOSIS — E7849 Other hyperlipidemia: Secondary | ICD-10-CM

## 2016-07-28 DIAGNOSIS — Z953 Presence of xenogenic heart valve: Secondary | ICD-10-CM | POA: Diagnosis not present

## 2016-07-28 DIAGNOSIS — Z79899 Other long term (current) drug therapy: Secondary | ICD-10-CM | POA: Diagnosis not present

## 2016-07-28 DIAGNOSIS — E784 Other hyperlipidemia: Secondary | ICD-10-CM | POA: Diagnosis not present

## 2016-07-28 DIAGNOSIS — M25531 Pain in right wrist: Secondary | ICD-10-CM | POA: Diagnosis not present

## 2016-07-28 DIAGNOSIS — J9611 Chronic respiratory failure with hypoxia: Secondary | ICD-10-CM

## 2016-07-28 DIAGNOSIS — I2510 Atherosclerotic heart disease of native coronary artery without angina pectoris: Secondary | ICD-10-CM | POA: Diagnosis not present

## 2016-07-28 DIAGNOSIS — Z7901 Long term (current) use of anticoagulants: Secondary | ICD-10-CM | POA: Diagnosis not present

## 2016-07-28 DIAGNOSIS — I059 Rheumatic mitral valve disease, unspecified: Secondary | ICD-10-CM

## 2016-07-28 DIAGNOSIS — F1010 Alcohol abuse, uncomplicated: Secondary | ICD-10-CM

## 2016-07-28 DIAGNOSIS — F17210 Nicotine dependence, cigarettes, uncomplicated: Secondary | ICD-10-CM | POA: Diagnosis not present

## 2016-07-28 LAB — BASIC METABOLIC PANEL
ANION GAP: 8 (ref 5–15)
BUN: 13 mg/dL (ref 6–20)
CHLORIDE: 107 mmol/L (ref 101–111)
CO2: 28 mmol/L (ref 22–32)
Calcium: 9.6 mg/dL (ref 8.9–10.3)
Creatinine, Ser: 1.03 mg/dL (ref 0.61–1.24)
GFR calc non Af Amer: >60 mL/min (ref >60)
Glucose, Bld: 102 mg/dL — ABNORMAL HIGH (ref 65–99)
POTASSIUM: 4.1 mmol/L (ref 3.5–5.1)
Sodium: 143 mmol/L (ref 135–145)

## 2016-07-28 LAB — DIGOXIN LEVEL: Digoxin Level: <0.2 ng/mL — ABNORMAL LOW (ref 0.8–2.0)

## 2016-07-28 MED ORDER — SACUBITRIL-VALSARTAN 49-51 MG PO TABS: 1.0000 | ORAL_TABLET | Freq: Two times a day (BID) | ORAL | 3 refills | Status: DC

## 2016-07-28 NOTE — Patient Instructions (Signed)
INCREASE Entresto to 49/51mg  (1 tablet) twice daily.  Routine lab work today. (bmet,digoxin) Will notify you of abnormal results  Repeat labs (bmet) 10-14 days.  Follow up with Dr.McLean in 2 months.
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
## 2016-08-07 ENCOUNTER — Inpatient Hospital Stay (HOSPITAL_COMMUNITY): Admission: RE | Admit: 2016-08-07 | Payer: Medicare Other | Source: Ambulatory Visit

## 2016-08-11 ENCOUNTER — Ambulatory Visit (HOSPITAL_COMMUNITY)
Admission: RE | Admit: 2016-08-11 | Discharge: 2016-08-11 | Disposition: A | Discharge status: Home / Self Care | Payer: Medicare Other | Source: Ambulatory Visit | Attending: Cardiology | Admitting: Cardiology

## 2016-08-11 DIAGNOSIS — I5022 Chronic systolic (congestive) heart failure: Secondary | ICD-10-CM | POA: Diagnosis present

## 2016-08-11 LAB — BASIC METABOLIC PANEL
Anion gap: 6 (ref 5–15)
BUN: 14 mg/dL (ref 6–20)
CALCIUM: 10.1 mg/dL (ref 8.9–10.3)
CO2: 31 mmol/L (ref 22–32)
CREATININE: 1.14 mg/dL (ref 0.61–1.24)
Chloride: 99 mmol/L — ABNORMAL LOW (ref 101–111)
GFR calc Af Amer: >60 mL/min (ref >60)
Glucose, Bld: 160 mg/dL — ABNORMAL HIGH (ref 65–99)
Potassium: 4.9 mmol/L (ref 3.5–5.1)
SODIUM: 136 mmol/L (ref 135–145)

## 2016-08-29 IMAGING — CR DG CHEST 2V
2 series · 2 of 2 positions shown · non-contrast
Comparison: 08/27/2015

CLINICAL DATA: Mid and left chest pain.  Shortness of breath.

EXAM:
CHEST  2 VIEW

[chest pa]
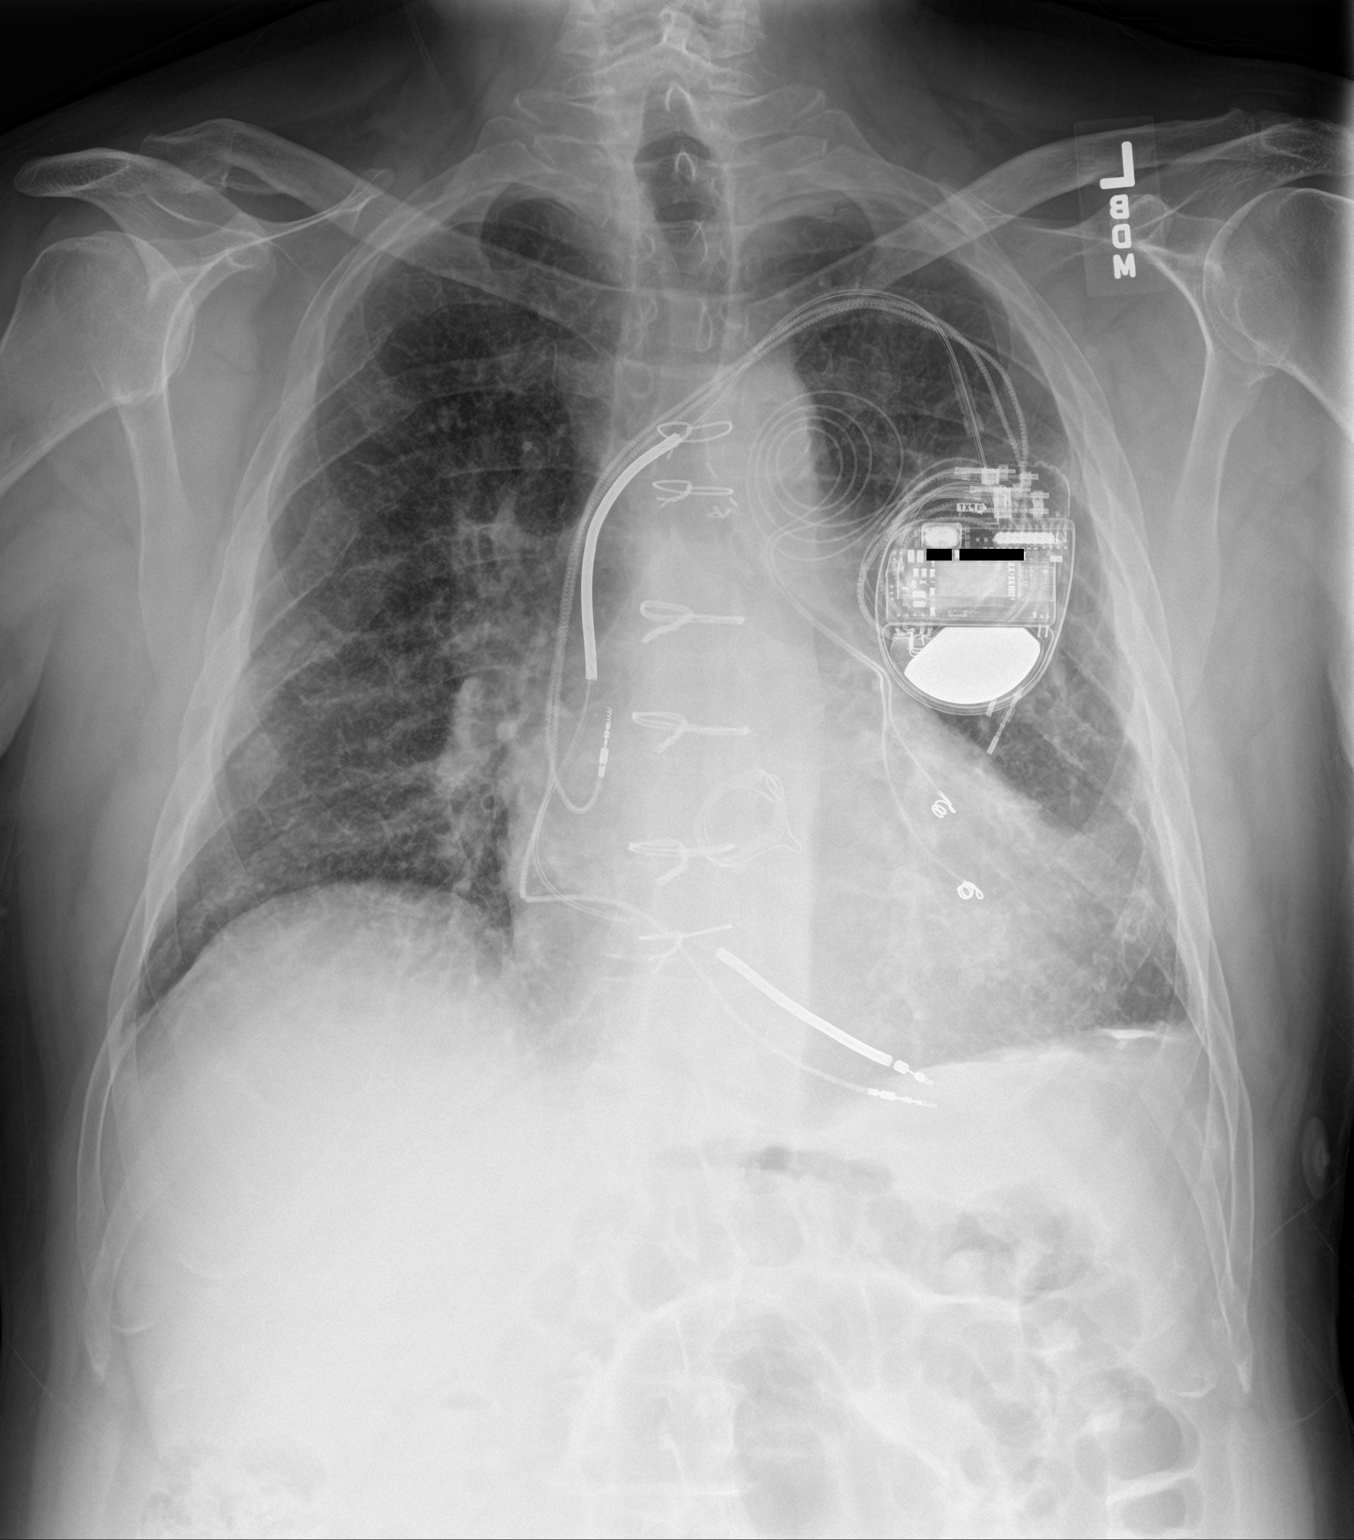

[chest lat]
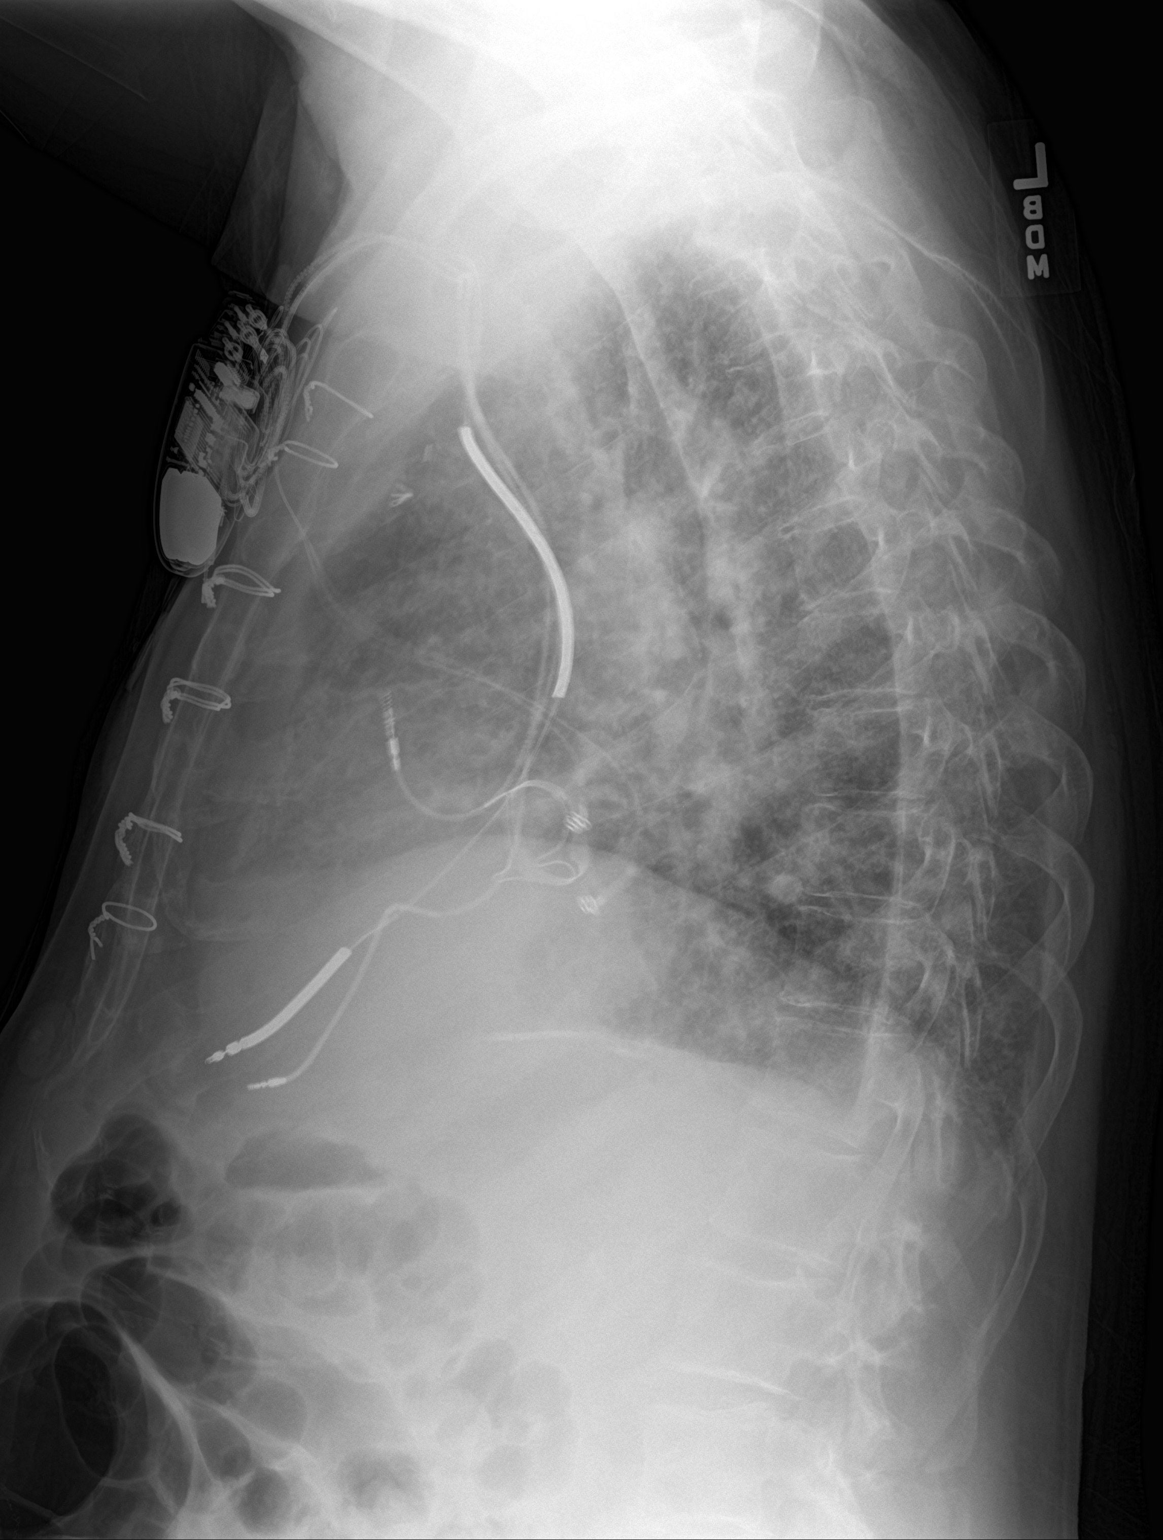

[2 of 2 positions shown; findings below may reference images not displayed]

FINDINGS: Coarse lung markings appear chronic. No evidence for acute pulmonary
edema. Old right rib fractures. Stable appearance of the left
cardiac ICD. Stable calcifications along the left hemidiaphragm.
Heart size is within normal limits and stable for size. Median
sternotomy and evidence for a prosthetic heart valve.
IMPRESSION: Chronic lung changes without acute findings.

## 2016-09-01 ENCOUNTER — Telehealth (HOSPITAL_COMMUNITY): Payer: Self-pay

## 2016-09-01 NOTE — Telephone Encounter (Signed)
Patient's daughter left VM on CHF triage line requesting status of cardiac clearance form to be completed by Dr. Shirlee Latch for removal of wrist cyst. Our office does not having anything on file pertianing to this for patient. Left return VM on daughter's VM explaining this and instructed to have surgeon's office fax form to our office to complete.  Ave Filter, RN

## 2016-09-01 NOTE — Telephone Encounter (Signed)
Requested cardiac clearance form to be faxed to CHF clinic from [facility] for upcoming wrist cyst removal for completion by Dr. Shirlee Latch. Patient's daughter made aware of this update.  Ave Filter, RN

## 2016-09-07 ENCOUNTER — Telehealth (HOSPITAL_COMMUNITY): Payer: Self-pay | Admitting: *Deleted

## 2016-09-07 NOTE — Telephone Encounter (Signed)
Received fax from [facility], pt needs clearance for left hand CTR, per Dr Shirlee Latch pt "ok for local anesthesia"  Note faxed to [facility] at [phone]

## 2016-09-11 ENCOUNTER — Other Ambulatory Visit: Payer: Self-pay | Admitting: Orthopedic Surgery

## 2016-09-18 ENCOUNTER — Telehealth (HOSPITAL_COMMUNITY): Payer: Self-pay | Admitting: *Deleted

## 2016-09-18 NOTE — Telephone Encounter (Signed)
Surgical clearance faxed today to [facility].  Dr. Shirlee Latch says it is ok to proceed with Left Hand:  CTR.  Patient is low risk.

## 2016-09-25 ENCOUNTER — Ambulatory Visit (HOSPITAL_COMMUNITY)
Admission: RE | Admit: 2016-09-25 | Discharge: 2016-09-25 | Disposition: A | Discharge status: Home / Self Care | Payer: Medicare Other | Source: Ambulatory Visit | Attending: Cardiology | Admitting: Cardiology

## 2016-09-25 VITALS — BP 144/76 | HR 76 | Wt 169.0 lb

## 2016-09-25 DIAGNOSIS — I2510 Atherosclerotic heart disease of native coronary artery without angina pectoris: Secondary | ICD-10-CM

## 2016-09-25 DIAGNOSIS — I255 Ischemic cardiomyopathy: Secondary | ICD-10-CM | POA: Diagnosis not present

## 2016-09-25 DIAGNOSIS — Z7984 Long term (current) use of oral hypoglycemic drugs: Secondary | ICD-10-CM | POA: Diagnosis not present

## 2016-09-25 DIAGNOSIS — M25531 Pain in right wrist: Secondary | ICD-10-CM | POA: Diagnosis not present

## 2016-09-25 DIAGNOSIS — I482 Chronic atrial fibrillation: Secondary | ICD-10-CM | POA: Diagnosis not present

## 2016-09-25 DIAGNOSIS — F17210 Nicotine dependence, cigarettes, uncomplicated: Secondary | ICD-10-CM | POA: Diagnosis not present

## 2016-09-25 DIAGNOSIS — I428 Other cardiomyopathies: Secondary | ICD-10-CM | POA: Insufficient documentation

## 2016-09-25 DIAGNOSIS — Z953 Presence of xenogenic heart valve: Secondary | ICD-10-CM | POA: Diagnosis not present

## 2016-09-25 DIAGNOSIS — I7389 Other specified peripheral vascular diseases: Secondary | ICD-10-CM | POA: Diagnosis not present

## 2016-09-25 DIAGNOSIS — Z79899 Other long term (current) drug therapy: Secondary | ICD-10-CM | POA: Diagnosis not present

## 2016-09-25 DIAGNOSIS — Z7901 Long term (current) use of anticoagulants: Secondary | ICD-10-CM | POA: Diagnosis not present

## 2016-09-25 DIAGNOSIS — I5022 Chronic systolic (congestive) heart failure: Secondary | ICD-10-CM | POA: Diagnosis not present

## 2016-09-25 DIAGNOSIS — I10 Essential (primary) hypertension: Secondary | ICD-10-CM

## 2016-09-25 DIAGNOSIS — I110 Hypertensive heart disease with heart failure: Secondary | ICD-10-CM | POA: Insufficient documentation

## 2016-09-25 LAB — BASIC METABOLIC PANEL
ANION GAP: 5 (ref 5–15)
BUN: 13 mg/dL (ref 6–20)
CALCIUM: 9.5 mg/dL (ref 8.9–10.3)
CO2: 29 mmol/L (ref 22–32)
Chloride: 103 mmol/L (ref 101–111)
Creatinine, Ser: 1.1 mg/dL (ref 0.61–1.24)
GFR calc Af Amer: >60 mL/min (ref >60)
GLUCOSE: 230 mg/dL — AB (ref 65–99)
Potassium: 4.8 mmol/L (ref 3.5–5.1)
Sodium: 137 mmol/L (ref 135–145)

## 2016-09-25 MED ORDER — NICOTINE 14 MG/24HR TD PT24: 14.0000 mg | MEDICATED_PATCH | Freq: Every day | TRANSDERMAL | 0 refills | Status: DC

## 2016-09-25 MED ORDER — SACUBITRIL-VALSARTAN 97-103 MG PO TABS: 1.0000 | ORAL_TABLET | Freq: Two times a day (BID) | ORAL | 6 refills | Status: DC
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
## 2016-09-25 NOTE — Patient Instructions (Addendum)
Increase Entresto to 97/103 mg Twice daily   Use Nicotine Patch, replace daily  Lab today  Your physician recommends that you schedule a follow-up appointment in: 2 months

## 2016-09-25 NOTE — Addendum Note (Signed)
Encounter addended by: Noralee Space, RN on: 09/25/2016  3:19 PM<BR>    Actions taken: Diagnosis association updated, Order list changed, Sign clinical note

## 2016-09-30 ENCOUNTER — Encounter: Payer: Self-pay | Admitting: Family

## 2016-10-01 ENCOUNTER — Other Ambulatory Visit: Payer: Self-pay | Admitting: Internal Medicine

## 2016-10-07 ENCOUNTER — Encounter: Payer: Self-pay | Admitting: Family

## 2016-10-07 ENCOUNTER — Ambulatory Visit (HOSPITAL_COMMUNITY)
Admission: RE | Admit: 2016-10-07 | Discharge: 2016-10-07 | Disposition: A | Discharge status: Home / Self Care | Payer: Medicare Other | Source: Ambulatory Visit | Attending: Family | Admitting: Family

## 2016-10-07 ENCOUNTER — Ambulatory Visit (INDEPENDENT_AMBULATORY_CARE_PROVIDER_SITE_OTHER): Payer: Medicare Other | Admitting: Family

## 2016-10-07 VITALS — BP 138/72 | HR 64 | Temp 98.1°F | Resp 18 | Ht 67.0 in | Wt 166.0 lb

## 2016-10-07 DIAGNOSIS — F17200 Nicotine dependence, unspecified, uncomplicated: Secondary | ICD-10-CM | POA: Diagnosis not present

## 2016-10-07 DIAGNOSIS — I70209 Unspecified atherosclerosis of native arteries of extremities, unspecified extremity: Secondary | ICD-10-CM

## 2016-10-07 DIAGNOSIS — E1151 Type 2 diabetes mellitus with diabetic peripheral angiopathy without gangrene: Secondary | ICD-10-CM | POA: Diagnosis not present

## 2016-10-07 NOTE — Progress Notes (Signed)
[facility] [place]   CC: Follow up peripheral artery occlusive disease  History of Present Illness [name] is a 71 y.o. male patient of Dr. Edilia Bo who returns for follow up of his infrainguinal arterial occlusive disease bilaterally.  He denies any history of claudication or rest pain. However, his daughter states he walks very little. He denies non healing wounds in his feet/legs.   He has a history of significant congestive heart failure with an EF of 20% and was told not to perform any strenuous activity. Unfortunately, he tells me that he smokes  1 pack per day of cigarettes. He used to work for a tobacco company.  He and his daughter report a history of a stoke in 57 and another about 2009. His daughter states that his residual neurological deficits are some cognitive issues.   He had recent left carpal tunnel surgery, is wearing a cast on his left wrist.   Pt Diabetic: Yes, last A1C result on file was 7.5 on 03-06-16. He is not taking any glycemic lowering agents.  Pt smoker: smoker  (1 ppd, started at age 70 yrs)  Pt meds include: Statin :Yes Betablocker: Yes ASA: No Other anticoagulants/antiplatelets: He takes Eliquis instead of coumadin, took coumadin since he was 71 years old when he had his first CABG, also had a CABG in 2008. He has a history of atrial fib.   Past Medical History:  Diagnosis Date  . AICD (automatic cardioverter/defibrillator) present   . Alcohol abuse   . Anxiety   . Bipolar disorder (HCC)   . Biventricular automatic implantable cardioverter defibrillator in situ    a. 01/2014 s/p MDT [id number] Viva XT CRT-D (ser # [id number]).  . CAD (coronary artery disease)    a. s/p prior PCI/stenting of the LCX;  b. 08/2014 MV: EF 20%, large septal, apical, and inferior infarct from apex to base, no ischemia;  c. 08/2014 NSTEMI/Cath: LM mod distal dzs extending into ostial LCX (80%), LAD tortuous, RI nl, OM mod dzs, RCA 100 CTO with  R->R and L->R collats-->Med Rx.  . Chronic atrial fibrillation (HCC)    a. CHA2DS2VASc = 6->eliquis;  b. S/P AVN RFCA an BiV ICD placement.  . Chronic respiratory failure (HCC)   . Chronic systolic congestive heart failure (HCC)    a. 01/2014 Echo: EF 25-30%, mod conc LVH, mod dil LA.  . CKD (chronic kidney disease)    a. suspect stage II-III based on historical labs.  . Complete heart block (HCC)    a. In setting of prior AV nodal ablation r/t afib-->BiV ICD (01/2014).  . COPD (chronic obstructive pulmonary disease) (HCC)   . CVA (cerebral vascular accident) (HCC)    a. Multiple prior embolic strokes.  . Dementia   . Depression   . DVT (deep venous thrombosis) (HCC)   . Dyslipidemia   . Hyperglycemia   . Hypertension   . Mitral valve disease    a. remote mitral replacement with Bjork Shiley valve;  b. 06/2006 Redo MVR with tissue valve.  . Mixed Ischemic and Nonischemic Cardiomyopathy    a. 8/.2015 Echo: Ef 25-30%;  b. 01/2014 s/p MDT [id number] [place] XT CRT-D (ser # [id number]).  . On home oxygen therapy    "3L at night" (11/23/2015)  . Ventral hernia     Social History Social History  Substance Use Topics  . Smoking status: Current Every Day Smoker    Packs/day: 1.00    Years: 42.00  Types: Cigarettes  . Smokeless tobacco: Never Used  . Alcohol use 0.0 oz/week     Comment: 11/22/2015 "6 pack of beer/month"    Family History Family History  Problem Relation Age of Onset  . Alzheimer's disease Mother   . Heart attack Father   . Heart disease Father   . Heart attack Son   . Varicose Veins Son   . Deep vein thrombosis Son   . Heart disease Son   . Stroke    . Heart disease    . Heart disease Brother     Past Surgical History:  Procedure Laterality Date  . AV NODE ABLATION  2007  . CARDIAC CATHETERIZATION  06/2006   Mild ostial L main stenosis, CFX stent patent, RCA occluded (old)  . CORONARY ARTERY BYPASS GRAFT  2008  . CYSTOSCOPY W/ URETERAL STENT PLACEMENT  07/12/2011    Procedure: CYSTOSCOPY WITH RETROGRADE PYELOGRAM/URETERAL STENT PLACEMENT;  Surgeon: Sebastian Ache, MD;  Location: [facility] OR;  Service: Urology;  Laterality: Left;  . HERNIA REPAIR    . ICD GENERATOR CHANGE  02/01/2014   Gen change to: Medtronic VIVA pulse generator, serial number [id number]  . INSERT / REPLACE / REMOVE PACEMAKER    . LEFT HEART CATHETERIZATION WITH CORONARY ANGIOGRAM N/A 08/23/2014   Procedure: LEFT HEART CATHETERIZATION WITH CORONARY ANGIOGRAM;  Surgeon: Corky Crafts, MD;  Location: [facility] CATH LAB;  Service: Cardiovascular;  Laterality: N/A;  . MITRAL VALVE REPLACEMENT     remote [employer] valve 1552 with redo tissue valve 06/2006  . PACEMAKER GENERATOR CHANGE N/A 02/01/2014   Procedure: PACEMAKER GENERATOR CHANGE;  Surgeon: Duke Salvia, MD;  Location: [facility] CATH LAB;  Service: Cardiovascular;  Laterality: N/A;  . PACEMAKER PLACEMENT  2006   Changed to CRT-D in 2009    Allergies  Allergen Reactions  . Warfarin Other (See Comments)    Non compliance and ETOH abuse    Current Outpatient Prescriptions  Medication Sig Dispense Refill  . albuterol (PROAIR HFA) 108 (90 Base) MCG/ACT inhaler 2 puffs every 4 hours as needed only  if your can't catch your breath 1 Inhaler 1  . apixaban (ELIQUIS) 5 MG TABS tablet Take 5 mg by mouth 2 (two) times daily.    Marland Kitchen atorvastatin (LIPITOR) 40 MG tablet Take 1 tablet (40 mg total) by mouth daily. 30 tablet 0  . carvedilol (COREG) 6.25 MG tablet Take 2 tablets (12.5 mg total) by mouth 2 (two) times daily with a meal. 60 tablet 6  . digoxin (LANOXIN) 0.125 MG tablet Take 1 tablet (0.125 mg total) by mouth daily. 90 tablet 3  . ENTRESTO 49-51 MG     . fenofibrate (TRICOR) 48 MG tablet TAKE 1 TABLET EVERY DAY 90 tablet 1  . furosemide (LASIX) 40 MG tablet Take 1 tablet (40 mg total) by mouth daily. 90 tablet 3  . loratadine (CLARITIN) 10 MG tablet Take 10 mg by mouth daily as needed for allergies.    . metFORMIN (GLUCOPHAGE) 500 MG  tablet Take 500 mg by mouth daily with breakfast.    . Multiple Vitamin (MULTIVITAMIN WITH MINERALS) TABS tablet Take 1 tablet by mouth daily. 30 tablet 2  . nicotine (NICODERM CQ) 14 mg/24hr patch Place 1 patch (14 mg total) onto the skin daily. 28 patch 0  . nitroGLYCERIN (NITROSTAT) 0.4 MG SL tablet Place 1 tablet (0.4 mg total) under the tongue every 5 (five) minutes as needed for chest pain. 25 tablet 2  . ranitidine (  ZANTAC) 150 MG tablet Take 150 mg by mouth daily as needed for heartburn.    . sacubitril-valsartan (ENTRESTO) 97-103 MG Take 1 tablet by mouth 2 (two) times daily. 60 tablet 6  . spironolactone (ALDACTONE) 25 MG tablet Take 25 mg by mouth daily.    Marland Kitchen thiamine 100 MG tablet Take 1 tablet (100 mg total) by mouth daily. 30 tablet 0  . HYDROcodone-acetaminophen (NORCO/VICODIN) 5-325 MG tablet      No current facility-administered medications for this visit.     ROS: See HPI for pertinent positives and negatives.   Physical Examination  Vitals:   10/07/16 1554  BP: 138/72  Pulse: 64  Resp: 18  Temp: 98.1 F (36.7 C)  SpO2: 93%  Weight: 166 lb (75.3 kg)  Height: 5\' 7"  (1.702 m)   Body mass index is 26 kg/m.  GENERAL: The patient is a well-nourished male, in no acute distress. The vital signs are documented above. CARDIAC: There is a regular rate and rhythm. + murmur. Pacemaker/defibrillator subcutaneous left upper chest. VASCULAR: Bilateral femoral pulses are palpable. Bilateral popliteal pulses are not palpable. Bilateral pedal pulses are not palpable. No signs of ischemia in his feet/legs. PULMONARY: There is fair air exchange bilaterally without wheezing or rales. He has an occasional moist cough. ABDOMEN: Soft and non-tender with normal pitched bowel sounds. Large asymptomatic reducible ventral/umbilical hernia.  MUSCULOSKELETAL: There are no major deformities or cyanosis. Cast present on left wrist, left fingers are pink and warm with brisk capillary refill.   NEUROLOGIC: No focal weakness or paresthesias are detected. He has significant hearing loss.  SKIN: There are no ulcers or rashes noted. PSYCHIATRIC: The patient has a normal affect.     Non-Invasive Vascular Imaging: DATE: 10/07/2016 ABI:  RIGHT: 0.68 (0.73, 10-02-15), Waveforms: PT: monophasic, PT: biphasic; TBI: 0.48 (0.28, 10-02-15)  LEFT: 0.78 (0.68, 10-02-15), Waveforms: monophasic; 0.29 (0.38, 10-02-15)   ASSESSMENT: JAVID KEMLER is a 71 y.o. male who has known infrainguinal arterial occlusive disease bilaterally.  He has no signs of ischemia in his feet/legs. He does not seem to walk enough to elicit claudication, but he does walk around his house.  Dr. Edilia Bo indicates in his assessment dated 09/05/14 that pt would be at high-risk for intervention given his cardiac history.  ABI's today indicate stable moderate bilateral arterial occlusive disease. All monophasic waveforms except biphasic right DP.   His atherosclerotic risk factors include active smoking since age 58 years (currently 1 ppd), DM, CAD with first MI at age 74, CHF with 20% EF, and COPD. The patient and his daughter were counseled re smoking cessation and given several free resources re smoking cessation.    PLAN:  Referral to endocrinologist, Dr. Leslie Dales, for management of his DM, at daughter's request.  Daily seated leg exercises discussed with pt and daughter and demonstrated.    The patient was counseled re smoking cessation and given several free resources re smoking cessation.  Based on the patient's vascular studies and examination, pt will return to clinic in 6 months with ABI's. I advised him and his daughter to notify us if he develops sores in his feet or legs that have trouble healing, or pain in his legs with walking.   I discussed in depth with the patient the nature of atherosclerosis, and emphasized the importance of maximal medical management including strict control of blood pressure, blood  glucose, and lipid levels, obtaining regular exercise, and cessation of smoking.  The patient is aware that without maximal  medical management the underlying atherosclerotic disease process will progress, limiting the benefit of any interventions.  The patient was given information about PAD including signs, symptoms, treatment, what symptoms should prompt the patient to seek immediate medical care, and risk reduction measures to take.  Charisse March, RN, MSN, FNP-C [facility] [place] Phone: [phone]  Clinic MD: Edilia Bo  10/07/16 3:58 PM

## 2016-10-07 NOTE — Patient Instructions (Signed)
Peripheral Vascular Disease Peripheral vascular disease (PVD) is a disease of the blood vessels that are not part of your heart and brain. A simple term for PVD is poor circulation. In most cases, PVD narrows the blood vessels that carry blood from your heart to the rest of your body. This can result in a decreased supply of blood to your arms, legs, and internal organs, like your stomach or kidneys. However, it most often affects a person's lower legs and feet. There are two types of PVD.  Organic PVD. This is the more common type. It is caused by damage to the structure of blood vessels.  Functional PVD. This is caused by conditions that make blood vessels contract and tighten (spasm). Without treatment, PVD tends to get worse over time. PVD can also lead to acute ischemic limb. This is when an arm or limb suddenly has trouble getting enough blood. This is a medical emergency. Follow these instructions at home:  Take medicines only as told by your doctor.  Do not use any tobacco products, including cigarettes, chewing tobacco, or electronic cigarettes. If you need help quitting, ask your doctor.  Lose weight if you are overweight, and maintain a healthy weight as told by your doctor.  Eat a diet that is low in fat and cholesterol. If you need help, ask your doctor.  Exercise regularly. Ask your doctor for some good activities for you.  Take good care of your feet.  Wear comfortable shoes that fit well.  Check your feet often for any cuts or sores. Contact a doctor if:  You have cramps in your legs while walking.  You have leg pain when you are at rest.  You have coldness in a leg or foot.  Your skin changes.  You are unable to get or have an erection (erectile dysfunction).  You have cuts or sores on your feet that are not healing. Get help right away if:  Your arm or leg turns cold and blue.  Your arms or legs become red, warm, swollen, painful, or numb.  You have  chest pain or trouble breathing.  You suddenly have weakness in your face, arm, or leg.  You become very confused or you cannot speak.  You suddenly have a very bad headache.  You suddenly cannot see. This information is not intended to replace advice given to you by your health care provider. Make sure you discuss any questions you have with your health care provider. Document Released: 08/19/2009 Document Revised: 10/31/2015 Document Reviewed: 11/02/2013 Elsevier Interactive Patient Education  2017 Elsevier Inc.      Steps to Quit Smoking Smoking tobacco can be bad for your health. It can also affect almost every organ in your body. Smoking puts you and people around you at risk for many serious long-lasting (chronic) diseases. Quitting smoking is hard, but it is one of the best things that you can do for your health. It is never too late to quit. What are the benefits of quitting smoking? When you quit smoking, you lower your risk for getting serious diseases and conditions. They can include:  Lung cancer or lung disease.  Heart disease.  Stroke.  Heart attack.  Not being able to have children (infertility).  Weak bones (osteoporosis) and broken bones (fractures). If you have coughing, wheezing, and shortness of breath, those symptoms may get better when you quit. You may also get sick less often. If you are pregnant, quitting smoking can help to lower your chances   of having a baby of low birth weight. What can I do to help me quit smoking? Talk with your doctor about what can help you quit smoking. Some things you can do (strategies) include:  Quitting smoking totally, instead of slowly cutting back how much you smoke over a period of time.  Going to in-person counseling. You are more likely to quit if you go to many counseling sessions.  Using resources and support systems, such as:  Online chats with a counselor.  Phone quitlines.  Printed self-help  materials.  Support groups or group counseling.  Text messaging programs.  Mobile phone apps or applications.  Taking medicines. Some of these medicines may have nicotine in them. If you are pregnant or breastfeeding, do not take any medicines to quit smoking unless your doctor says it is okay. Talk with your doctor about counseling or other things that can help you. Talk with your doctor about using more than one strategy at the same time, such as taking medicines while you are also going to in-person counseling. This can help make quitting easier. What things can I do to make it easier to quit? Quitting smoking might feel very hard at first, but there is a lot that you can do to make it easier. Take these steps:  Talk to your family and friends. Ask them to support and encourage you.  Call phone quitlines, reach out to support groups, or work with a counselor.  Ask people who smoke to not smoke around you.  Avoid places that make you want (trigger) to smoke, such as:  Bars.  Parties.  Smoke-break areas at work.  Spend time with people who do not smoke.  Lower the stress in your life. Stress can make you want to smoke. Try these things to help your stress:  Getting regular exercise.  Deep-breathing exercises.  Yoga.  Meditating.  Doing a body scan. To do this, close your eyes, focus on one area of your body at a time from head to toe, and notice which parts of your body are tense. Try to relax the muscles in those areas.  Download or buy apps on your mobile phone or tablet that can help you stick to your quit plan. There are many free apps, such as QuitGuide from the CDC (Centers for Disease Control and Prevention). You can find more support from smokefree.gov and other websites. This information is not intended to replace advice given to you by your health care provider. Make sure you discuss any questions you have with your health care provider. Document Released:  03/21/2009 Document Revised: 01/21/2016 Document Reviewed: 10/09/2014 Elsevier Interactive Patient Education  2017 Elsevier Inc.  

## 2016-10-08 NOTE — Addendum Note (Signed)
Addended by: Burton Apley A on: 10/08/2016 01:18 PM   Modules accepted: Orders

## 2016-10-09 ENCOUNTER — Telehealth: Payer: Self-pay | Admitting: Family

## 2016-10-09 NOTE — Telephone Encounter (Signed)
Patient's daughter [name] called me to inquire about the endocrinology referral made by [name] on 10/07/16 when her father was seen in our office. She stated she had not heard anything from our office regarding this.  The referral was in Epic but Dr.Michael Altheimer is w/ [facility] and they are not using Epic. [facility]  ph#[phone] fax#[phone] [address] [place]   I contacted his office and asked about the referral process. They requested a faxed referral. Faxed over the referral, office notes and pt's demographics and insurance information. Their office stated they would contact the patient to schedule a new patient consultation. I called the patient's daughter back at 12:47pm and left her a voice message regarding the above information. awt

## 2016-10-21 ENCOUNTER — Ambulatory Visit (INDEPENDENT_AMBULATORY_CARE_PROVIDER_SITE_OTHER): Payer: Medicare Other | Admitting: *Deleted

## 2016-10-21 DIAGNOSIS — I482 Chronic atrial fibrillation, unspecified: Secondary | ICD-10-CM

## 2016-10-21 DIAGNOSIS — I255 Ischemic cardiomyopathy: Secondary | ICD-10-CM

## 2016-10-21 LAB — CUP PACEART REMOTE DEVICE CHECK
Battery Remaining Longevity: 41 mo
Battery Voltage: 2.95 V
Brady Statistic AP VP Percent: 0 %
Brady Statistic AS VP Percent: 96.82 %
Brady Statistic RV Percent Paced: 97.87 %
HIGH POWER IMPEDANCE MEASURED VALUE: 44 Ohm
HighPow Impedance: 54 Ohm
Implantable Lead Implant Date: 20090617
Implantable Lead Location: 753859
Implantable Lead Location: 753860
Implantable Lead Model: 5071
Implantable Lead Model: 6947
Lead Channel Impedance Value: 399 Ohm
Lead Channel Impedance Value: 4047 Ohm
Lead Channel Impedance Value: 570 Ohm
Lead Channel Pacing Threshold Amplitude: 0.625 V
Lead Channel Pacing Threshold Amplitude: 1.375 V
Lead Channel Pacing Threshold Pulse Width: 0.4 ms
Lead Channel Pacing Threshold Pulse Width: 0.4 ms
Lead Channel Pacing Threshold Pulse Width: 0.4 ms
Lead Channel Sensing Intrinsic Amplitude: 1.125 mV
Lead Channel Sensing Intrinsic Amplitude: 24.875 mV
Lead Channel Setting Pacing Amplitude: 2.5 V
Lead Channel Setting Pacing Pulse Width: 0.4 ms
Lead Channel Setting Sensing Sensitivity: 0.45 mV
MDC IDC LEAD IMPLANT DT: 20060307
MDC IDC LEAD IMPLANT DT: 20080110
MDC IDC LEAD LOCATION: 753858
MDC IDC MSMT LEADCHNL LV IMPEDANCE VALUE: 4047 Ohm
MDC IDC MSMT LEADCHNL RA IMPEDANCE VALUE: 570 Ohm
MDC IDC MSMT LEADCHNL RA SENSING INTR AMPL: 1.125 mV
MDC IDC MSMT LEADCHNL RV IMPEDANCE VALUE: 589 Ohm
MDC IDC MSMT LEADCHNL RV PACING THRESHOLD AMPLITUDE: 0.5 V
MDC IDC MSMT LEADCHNL RV SENSING INTR AMPL: 24.875 mV
MDC IDC PG IMPLANT DT: 20150827
MDC IDC SESS DTM: 20180516101607
MDC IDC SET LEADCHNL LV PACING AMPLITUDE: 2.5 V
MDC IDC SET LEADCHNL RV PACING PULSEWIDTH: 0.4 ms
MDC IDC STAT BRADY AP VS PERCENT: 0 %
MDC IDC STAT BRADY AS VS PERCENT: 3.18 %
MDC IDC STAT BRADY RA PERCENT PACED: 0 %

## 2016-10-21 NOTE — Progress Notes (Signed)
Remote ICD transmission.   

## 2016-10-22 ENCOUNTER — Encounter: Payer: Self-pay | Admitting: Cardiology

## 2016-11-20 ENCOUNTER — Other Ambulatory Visit (HOSPITAL_COMMUNITY): Payer: Self-pay | Admitting: Cardiology

## 2016-11-20 ENCOUNTER — Other Ambulatory Visit (HOSPITAL_COMMUNITY): Payer: Self-pay | Admitting: Student

## 2016-11-20 MED ORDER — SACUBITRIL-VALSARTAN 97-103 MG PO TABS: 1.0000 | ORAL_TABLET | Freq: Two times a day (BID) | ORAL | 6 refills | Status: DC

## 2016-11-25 ENCOUNTER — Encounter (HOSPITAL_COMMUNITY): Payer: Self-pay

## 2016-11-25 ENCOUNTER — Ambulatory Visit (HOSPITAL_COMMUNITY)
Admission: RE | Admit: 2016-11-25 | Discharge: 2016-11-25 | Disposition: A | Discharge status: Home / Self Care | Payer: Medicare Other | Source: Ambulatory Visit | Attending: Internal Medicine | Admitting: Internal Medicine

## 2016-11-25 VITALS — BP 128/68 | HR 88 | Wt 171.3 lb

## 2016-11-25 DIAGNOSIS — Z8249 Family history of ischemic heart disease and other diseases of the circulatory system: Secondary | ICD-10-CM | POA: Insufficient documentation

## 2016-11-25 DIAGNOSIS — Z95810 Presence of automatic (implantable) cardiac defibrillator: Secondary | ICD-10-CM | POA: Diagnosis not present

## 2016-11-25 DIAGNOSIS — I5022 Chronic systolic (congestive) heart failure: Secondary | ICD-10-CM | POA: Diagnosis not present

## 2016-11-25 DIAGNOSIS — I739 Peripheral vascular disease, unspecified: Secondary | ICD-10-CM | POA: Diagnosis not present

## 2016-11-25 DIAGNOSIS — J449 Chronic obstructive pulmonary disease, unspecified: Secondary | ICD-10-CM | POA: Insufficient documentation

## 2016-11-25 DIAGNOSIS — I2510 Atherosclerotic heart disease of native coronary artery without angina pectoris: Secondary | ICD-10-CM | POA: Insufficient documentation

## 2016-11-25 DIAGNOSIS — Z953 Presence of xenogenic heart valve: Secondary | ICD-10-CM | POA: Diagnosis not present

## 2016-11-25 DIAGNOSIS — Z8673 Personal history of transient ischemic attack (TIA), and cerebral infarction without residual deficits: Secondary | ICD-10-CM | POA: Insufficient documentation

## 2016-11-25 DIAGNOSIS — Z79899 Other long term (current) drug therapy: Secondary | ICD-10-CM | POA: Diagnosis not present

## 2016-11-25 DIAGNOSIS — E785 Hyperlipidemia, unspecified: Secondary | ICD-10-CM | POA: Insufficient documentation

## 2016-11-25 DIAGNOSIS — I482 Chronic atrial fibrillation, unspecified: Secondary | ICD-10-CM

## 2016-11-25 DIAGNOSIS — I10 Essential (primary) hypertension: Secondary | ICD-10-CM

## 2016-11-25 DIAGNOSIS — Z9181 History of falling: Secondary | ICD-10-CM | POA: Insufficient documentation

## 2016-11-25 DIAGNOSIS — Z7901 Long term (current) use of anticoagulants: Secondary | ICD-10-CM | POA: Insufficient documentation

## 2016-11-25 DIAGNOSIS — F1010 Alcohol abuse, uncomplicated: Secondary | ICD-10-CM

## 2016-11-25 DIAGNOSIS — F319 Bipolar disorder, unspecified: Secondary | ICD-10-CM | POA: Diagnosis not present

## 2016-11-25 DIAGNOSIS — F17210 Nicotine dependence, cigarettes, uncomplicated: Secondary | ICD-10-CM | POA: Diagnosis not present

## 2016-11-25 DIAGNOSIS — Z7984 Long term (current) use of oral hypoglycemic drugs: Secondary | ICD-10-CM | POA: Diagnosis not present

## 2016-11-25 DIAGNOSIS — Z86718 Personal history of other venous thrombosis and embolism: Secondary | ICD-10-CM | POA: Diagnosis not present

## 2016-11-25 DIAGNOSIS — I110 Hypertensive heart disease with heart failure: Secondary | ICD-10-CM | POA: Insufficient documentation

## 2016-11-25 LAB — BASIC METABOLIC PANEL
Anion gap: 9 (ref 5–15)
BUN: 20 mg/dL (ref 6–20)
CALCIUM: 9.6 mg/dL (ref 8.9–10.3)
CO2: 30 mmol/L (ref 22–32)
CREATININE: 1.32 mg/dL — AB (ref 0.61–1.24)
Chloride: 101 mmol/L (ref 101–111)
GFR calc Af Amer: >60 mL/min (ref >60)
GFR, EST NON AFRICAN AMERICAN: 53 mL/min — AB (ref >60)
Glucose, Bld: 169 mg/dL — ABNORMAL HIGH (ref 65–99)
POTASSIUM: 4.3 mmol/L (ref 3.5–5.1)
SODIUM: 140 mmol/L (ref 135–145)

## 2016-11-25 LAB — BRAIN NATRIURETIC PEPTIDE: B Natriuretic Peptide: 130.4 pg/mL — ABNORMAL HIGH (ref 0.0–100.0)

## 2016-11-25 MED ORDER — CARVEDILOL 6.25 MG PO TABS: 6.2500 mg | ORAL_TABLET | Freq: Two times a day (BID) | ORAL | 11 refills | Status: DC
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
## 2016-11-25 NOTE — Patient Instructions (Addendum)
PLEASE take an extra lasix today RESTART Coreg 6.26 mg, one tab twice a day  Labs today We will only contact you if something comes back abnormal or we need to make some changes. Otherwise no news is good news!  Your physician recommends that you schedule a follow-up appointment in: 3 months   Take all medication as prescribed the day of your appointment. Bring all medications with you to your appointment.  Do the following things EVERYDAY: 1) Weigh yourself in the morning before breakfast. Write it down and keep it in a log. 2) Take your medicines as prescribed 3) Eat low salt foods-Limit salt (sodium) to 2000 mg per day.  4) Stay as active as you can everyday 5) Limit all fluids for the day to less than 2 liters

## 2016-12-08 ENCOUNTER — Other Ambulatory Visit: Payer: Self-pay | Admitting: Family Medicine

## 2016-12-08 ENCOUNTER — Ambulatory Visit
Admission: RE | Admit: 2016-12-08 | Discharge: 2016-12-08 | Disposition: A | Discharge status: Home / Self Care | Payer: Medicare Other | Source: Ambulatory Visit | Attending: Family Medicine | Admitting: Family Medicine

## 2016-12-08 DIAGNOSIS — R05 Cough: Secondary | ICD-10-CM

## 2016-12-08 DIAGNOSIS — R059 Cough, unspecified: Secondary | ICD-10-CM

## 2016-12-08 IMAGING — CR DG CHEST 1V PORT
1 series · 1 of 1 positions shown · non-contrast
Comparison: 11/22/2015

CLINICAL DATA: Nausea and vomiting for 3 days. Cough. Wheezing.
Hypoxia.

EXAM:
PORTABLE CHEST 1 VIEW

[AP]
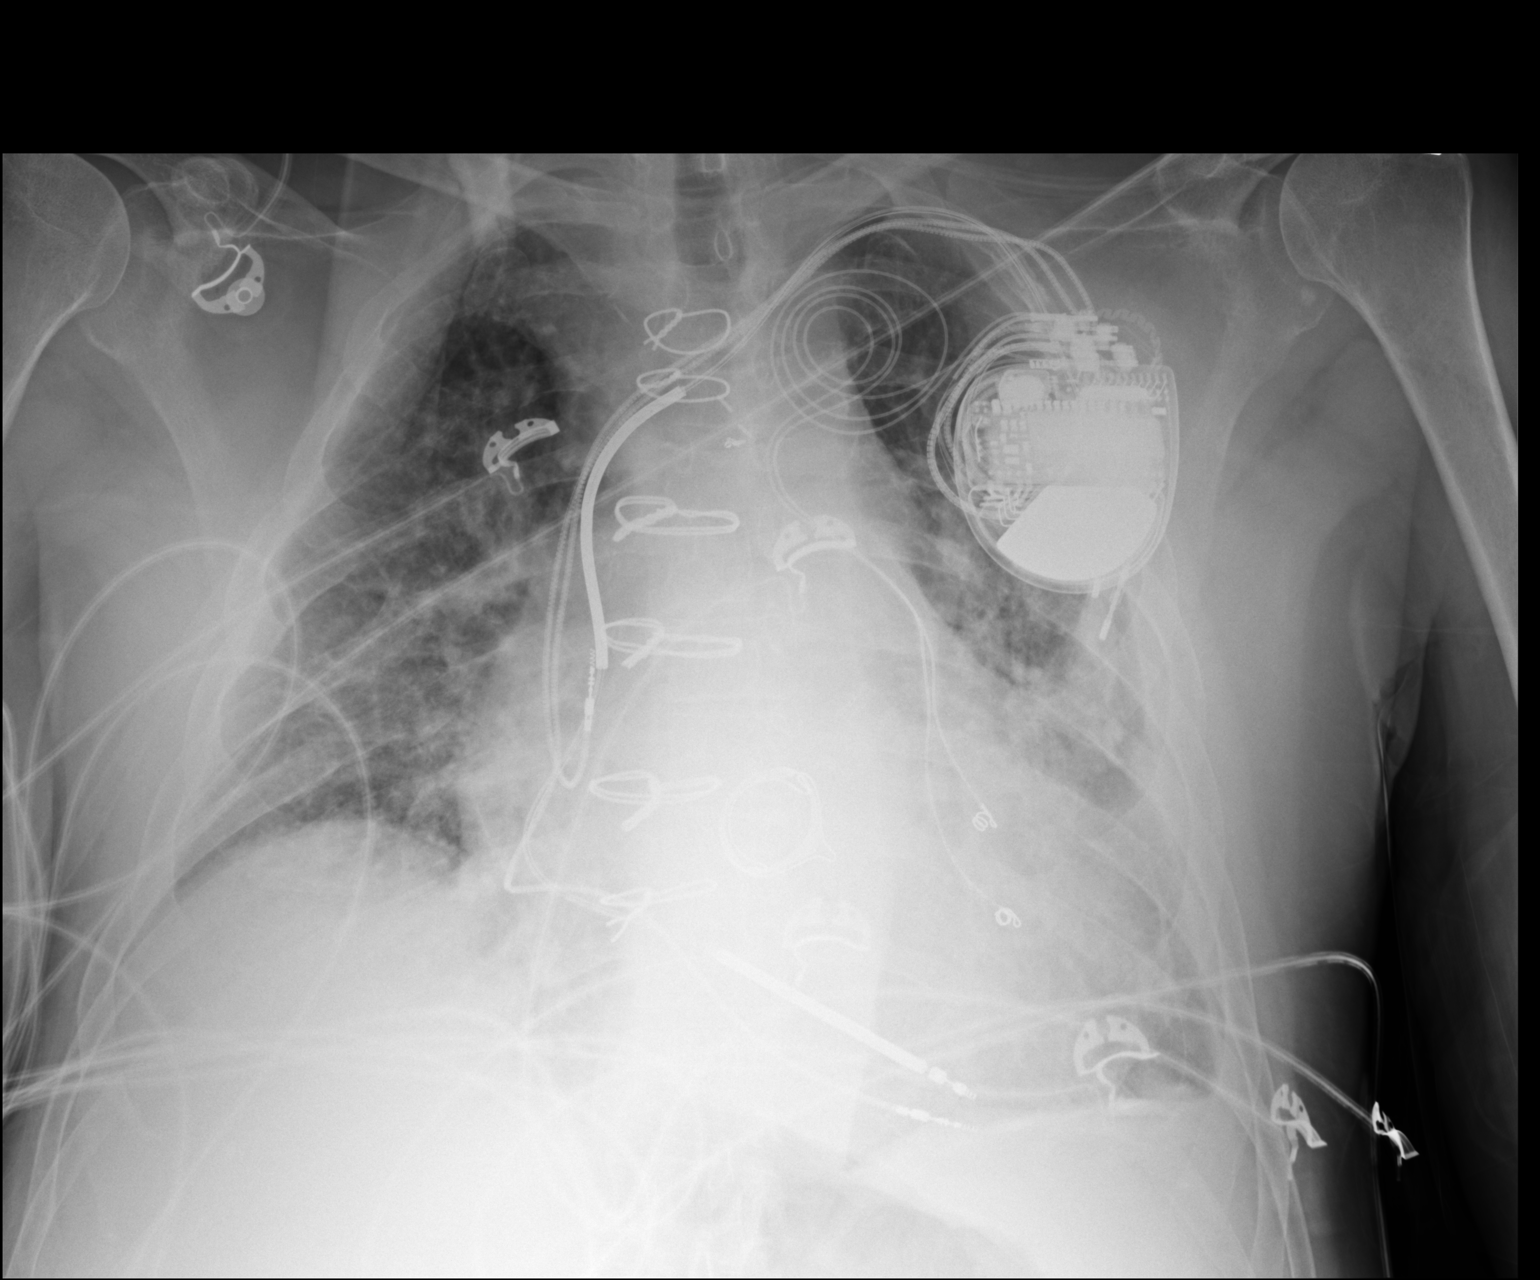

[1 of 1 positions shown; findings below may reference images not displayed]

FINDINGS: Pacer/defibrillator leads appear stable. Prosthetic tricuspid valve.

Moderate enlargement of the cardiopericardial silhouette with low
lung volumes and bilateral interstitial and patchy airspace
opacities. Mildly elevated right hemidiaphragm. Calcifications along
the left hemidiaphragm.

Old right rib deformities, healed.
IMPRESSION: 1. Bilateral airspace opacities, left greater than right, especially
in the perihilar region, accompanied by indistinct pulmonary
vasculature and moderate enlargement of the cardiopericardial
silhouette, favoring acute pulmonary edema over multilobar
pneumonia.
2. Elevated right hemidiaphragm.
3. Stable appearance of the pacer/defibrillator leads and prosthetic
mitral valve.

## 2016-12-09 IMAGING — CR DG CHEST 1V PORT
1 series · 1 of 1 positions shown · non-contrast
Comparison: March 03, 2016 study obtained earlier in the day

CLINICAL DATA: Restlessness. Coronary artery disease. Cardiac
arrhythmia.

EXAM:
PORTABLE CHEST 1 VIEW

[AP]
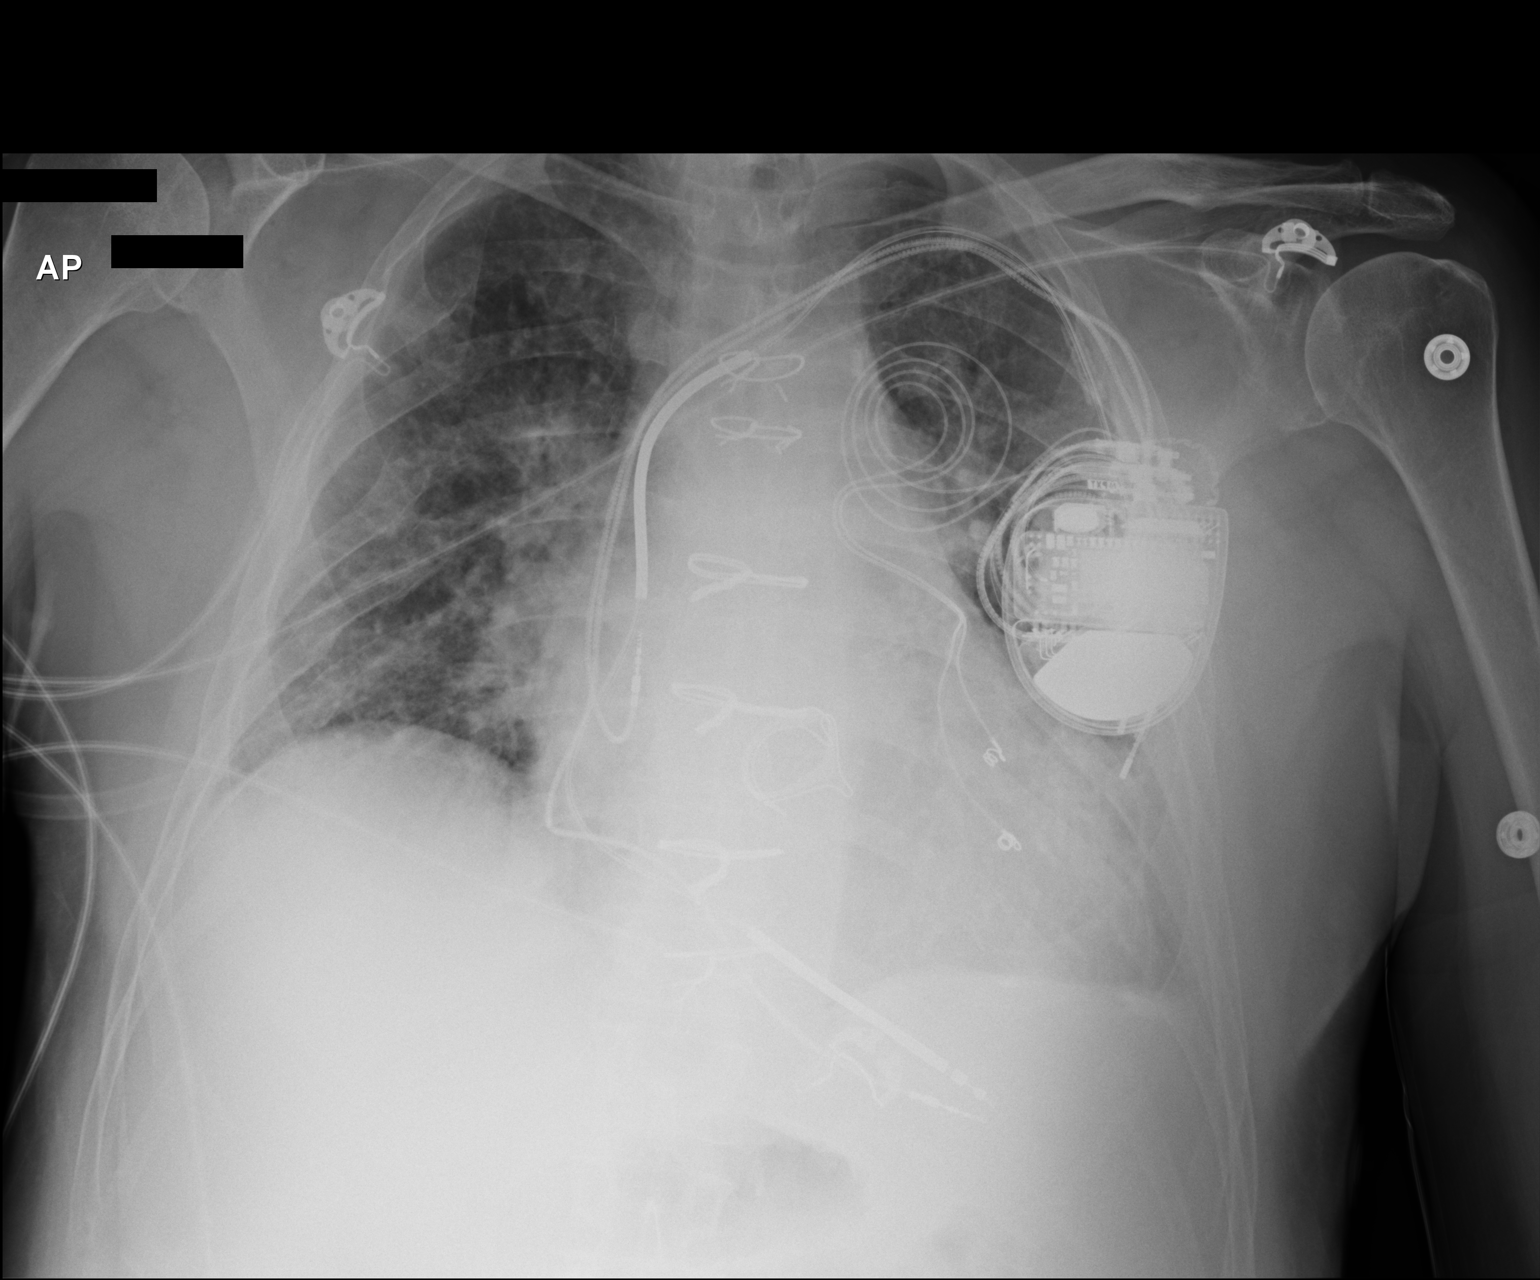

[1 of 1 positions shown; findings below may reference images not displayed]

FINDINGS: There remains underlying interstitial edema. There has been partial
but incomplete clearing of airspace opacity from the left lower
lobe. No new opacity evident. There is cardiomegaly with pulmonary
venous hypertension. Pacemaker leads are attached to the right
atrium, right ventricle, and left ventricle. Patient is status post
mitral valve replacement. No adenopathy. There old healed rib
fractures on the right.
IMPRESSION: Compared to earlier in the day, there is partial clearing of
airspace opacity from the left base, likely partial clearing of
alveolar edema. There is underlying interstitial edema elsewhere
which is stable. No new opacity. Stable cardiac silhouette. No
pneumothorax. Old rib fractures on the right noted.

## 2016-12-09 IMAGING — CR DG CHEST 1V PORT
1 series · 1 of 1 positions shown · non-contrast
Comparison: 03/02/2016

CLINICAL DATA: Shortness of breath

EXAM:
PORTABLE CHEST 1 VIEW

[AP]
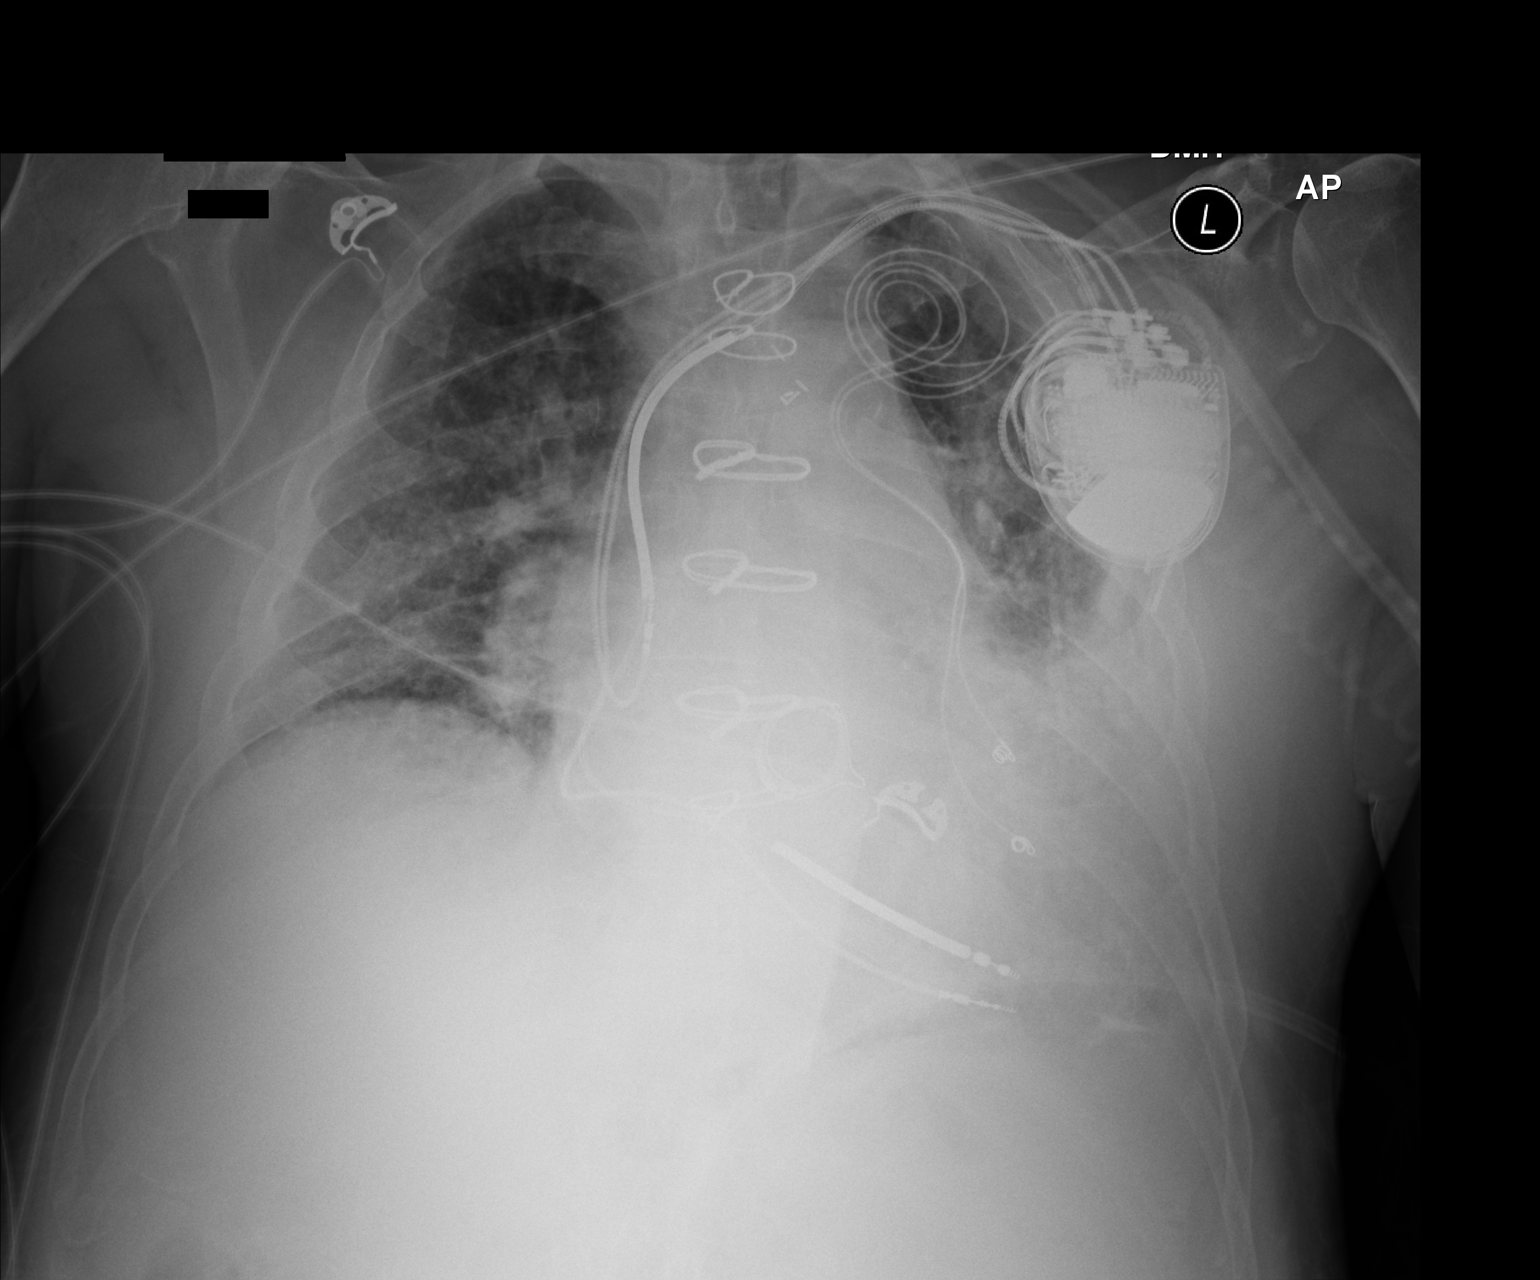

[1 of 1 positions shown; findings below may reference images not displayed]

FINDINGS: Chronic cardiomegaly. Direct and transvenous pacer/ ICD wires in
unchanged position. Status post mitral valve replacement. Low volume
chest with patchy bilateral opacity. Possible pleural fluid or
thickening on the left. No visible pneumothorax.
IMPRESSION: Unchanged bilateral lung opacity which could be infectious or
congestive.

## 2016-12-14 ENCOUNTER — Other Ambulatory Visit: Payer: Self-pay | Admitting: Internal Medicine

## 2016-12-15 IMAGING — CR DG CHEST 2V
2 series · 2 of 2 positions shown · non-contrast
Comparison: Portable chest x-ray March 03, 2016

CLINICAL DATA: COPD, atrial fibrillation, CHF.

EXAM:
CHEST  2 VIEW

[chest lat]
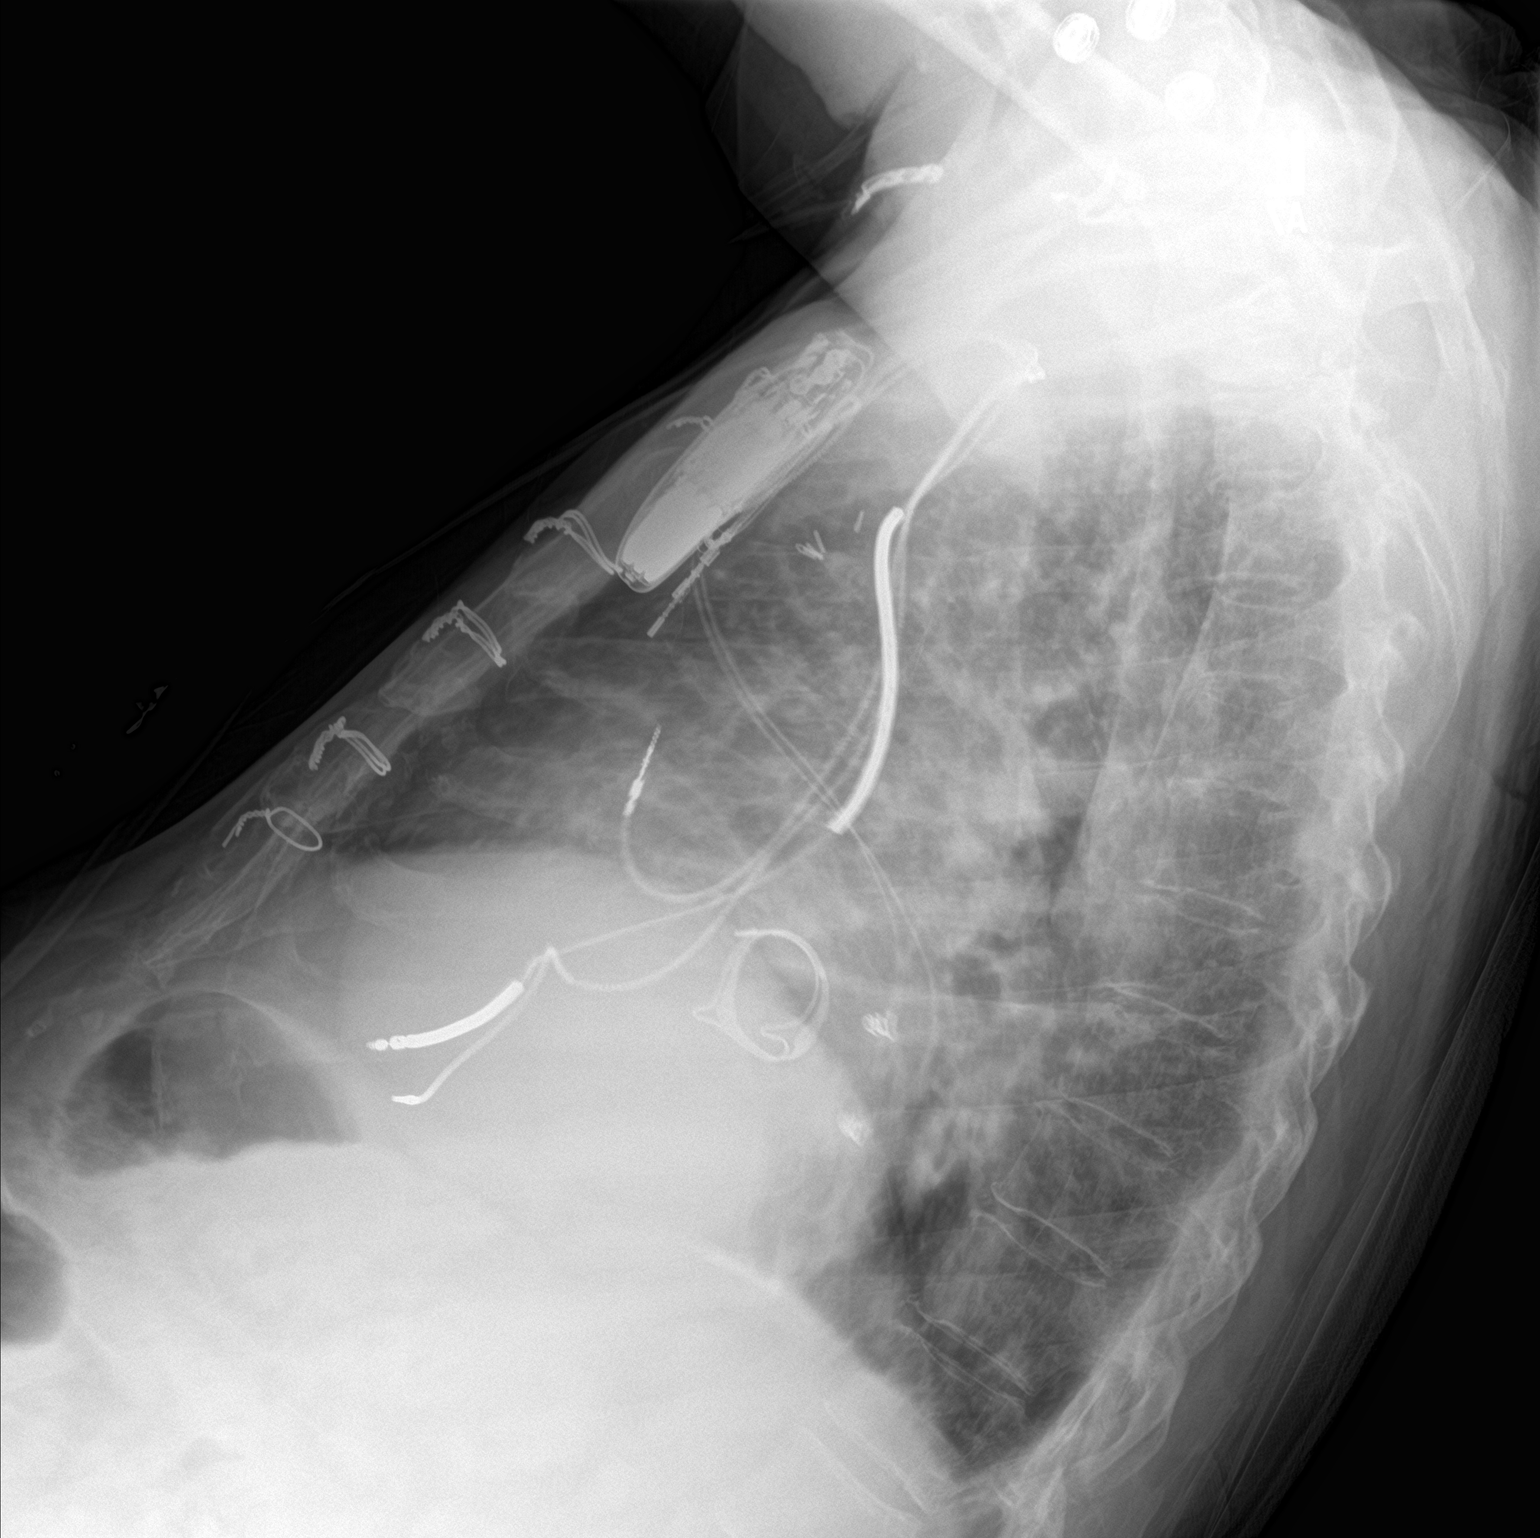

[chest ap]
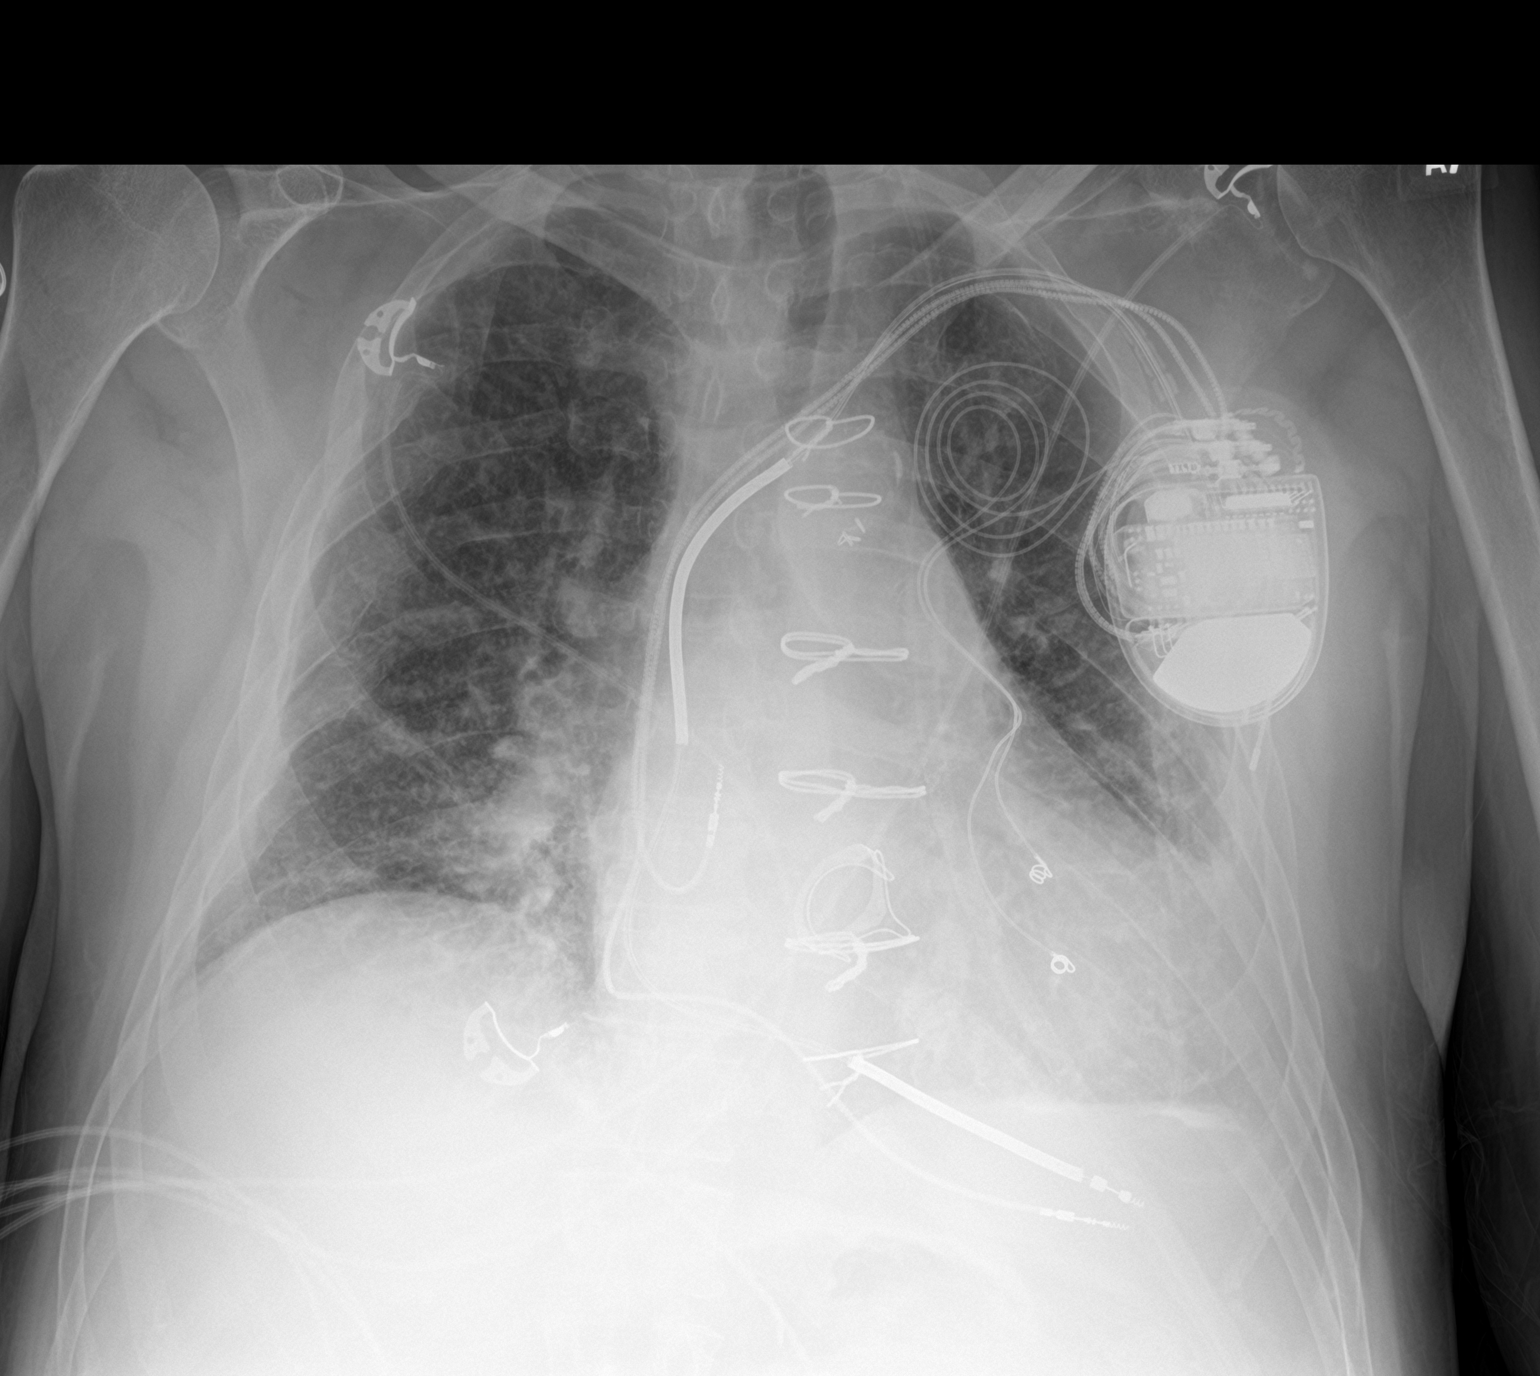

[2 of 2 positions shown; findings below may reference images not displayed]

FINDINGS: The lungs are adequately inflated. The interstitial markings have
decreased in conspicuity. The cardiac silhouette is mildly enlarged.
The pulmonary vascularity is less engorged. The permanent pacemaker
defibrillator is in stable position. A prosthetic mitral valve
appears stable. Previous CABG. Aortic atherosclerosis. Pleural
calcification on the left. Old posterior right rib fractures are
present.
IMPRESSION: Decreased pulmonary interstitial edema. Stable cardiomegaly. The ICD
is in stable position.

Aortic atherosclerosis.

## 2017-01-19 ENCOUNTER — Telehealth (HOSPITAL_COMMUNITY): Payer: Self-pay | Admitting: *Deleted

## 2017-01-19 NOTE — Telephone Encounter (Signed)
Patient's wife called and left VM on triage line asking for [place] to call her back ASAP.  I called her back but had to leave message asking for her to call us back.

## 2017-01-20 ENCOUNTER — Ambulatory Visit (INDEPENDENT_AMBULATORY_CARE_PROVIDER_SITE_OTHER): Payer: Medicare Other | Admitting: *Deleted

## 2017-01-20 DIAGNOSIS — I482 Chronic atrial fibrillation, unspecified: Secondary | ICD-10-CM

## 2017-01-20 DIAGNOSIS — I5022 Chronic systolic (congestive) heart failure: Secondary | ICD-10-CM

## 2017-01-21 LAB — CUP PACEART REMOTE DEVICE CHECK
Brady Statistic AS VS Percent: 1.95 %
Brady Statistic RA Percent Paced: 0 %
Date Time Interrogation Session: 20180815052203
HIGH POWER IMPEDANCE MEASURED VALUE: 55 Ohm
HighPow Impedance: 44 Ohm
Implantable Lead Implant Date: 20060307
Implantable Lead Implant Date: 20090617
Implantable Lead Location: 753858
Implantable Lead Location: 753859
Implantable Lead Location: 753860
Implantable Lead Model: 6947
Implantable Pulse Generator Implant Date: 20150827
Lead Channel Impedance Value: 399 Ohm
Lead Channel Impedance Value: 4047 Ohm
Lead Channel Impedance Value: 4047 Ohm
Lead Channel Impedance Value: 532 Ohm
Lead Channel Impedance Value: 570 Ohm
Lead Channel Pacing Threshold Amplitude: 0.625 V
Lead Channel Pacing Threshold Pulse Width: 0.4 ms
Lead Channel Pacing Threshold Pulse Width: 0.4 ms
Lead Channel Pacing Threshold Pulse Width: 0.4 ms
Lead Channel Sensing Intrinsic Amplitude: 1.125 mV
Lead Channel Sensing Intrinsic Amplitude: 1.125 mV
Lead Channel Sensing Intrinsic Amplitude: 24.875 mV
Lead Channel Sensing Intrinsic Amplitude: 24.875 mV
Lead Channel Setting Pacing Amplitude: 2.5 V
Lead Channel Setting Pacing Pulse Width: 0.4 ms
Lead Channel Setting Sensing Sensitivity: 0.45 mV
MDC IDC LEAD IMPLANT DT: 20080110
MDC IDC MSMT BATTERY REMAINING LONGEVITY: 36 mo
MDC IDC MSMT BATTERY VOLTAGE: 2.95 V
MDC IDC MSMT LEADCHNL LV PACING THRESHOLD AMPLITUDE: 1.625 V
MDC IDC MSMT LEADCHNL RV IMPEDANCE VALUE: 494 Ohm
MDC IDC MSMT LEADCHNL RV PACING THRESHOLD AMPLITUDE: 0.75 V
MDC IDC SET LEADCHNL RV PACING AMPLITUDE: 2.5 V
MDC IDC SET LEADCHNL RV PACING PULSEWIDTH: 0.4 ms
MDC IDC STAT BRADY AP VP PERCENT: 0 %
MDC IDC STAT BRADY AP VS PERCENT: 0 %
MDC IDC STAT BRADY AS VP PERCENT: 98.05 %
MDC IDC STAT BRADY RV PERCENT PACED: 98.67 %

## 2017-01-21 NOTE — Progress Notes (Signed)
Remote defibrillator check.  

## 2017-01-27 ENCOUNTER — Ambulatory Visit (HOSPITAL_COMMUNITY)
Admission: RE | Admit: 2017-01-27 | Discharge: 2017-01-27 | Disposition: A | Discharge status: Home / Self Care | Payer: Medicare Other | Source: Ambulatory Visit | Attending: Cardiology | Admitting: Cardiology

## 2017-01-27 ENCOUNTER — Encounter (HOSPITAL_COMMUNITY): Payer: Self-pay | Admitting: Cardiology

## 2017-01-27 VITALS — BP 138/58 | HR 73 | Wt 175.5 lb

## 2017-01-27 DIAGNOSIS — F319 Bipolar disorder, unspecified: Secondary | ICD-10-CM | POA: Insufficient documentation

## 2017-01-27 DIAGNOSIS — Z955 Presence of coronary angioplasty implant and graft: Secondary | ICD-10-CM | POA: Diagnosis not present

## 2017-01-27 DIAGNOSIS — J449 Chronic obstructive pulmonary disease, unspecified: Secondary | ICD-10-CM | POA: Insufficient documentation

## 2017-01-27 DIAGNOSIS — I5022 Chronic systolic (congestive) heart failure: Secondary | ICD-10-CM | POA: Insufficient documentation

## 2017-01-27 DIAGNOSIS — F17210 Nicotine dependence, cigarettes, uncomplicated: Secondary | ICD-10-CM | POA: Insufficient documentation

## 2017-01-27 DIAGNOSIS — Z9889 Other specified postprocedural states: Secondary | ICD-10-CM | POA: Insufficient documentation

## 2017-01-27 DIAGNOSIS — Z952 Presence of prosthetic heart valve: Secondary | ICD-10-CM | POA: Diagnosis not present

## 2017-01-27 DIAGNOSIS — F329 Major depressive disorder, single episode, unspecified: Secondary | ICD-10-CM | POA: Insufficient documentation

## 2017-01-27 DIAGNOSIS — I2510 Atherosclerotic heart disease of native coronary artery without angina pectoris: Secondary | ICD-10-CM | POA: Diagnosis not present

## 2017-01-27 DIAGNOSIS — I426 Alcoholic cardiomyopathy: Secondary | ICD-10-CM | POA: Diagnosis not present

## 2017-01-27 DIAGNOSIS — Z7984 Long term (current) use of oral hypoglycemic drugs: Secondary | ICD-10-CM | POA: Insufficient documentation

## 2017-01-27 DIAGNOSIS — I110 Hypertensive heart disease with heart failure: Secondary | ICD-10-CM | POA: Diagnosis not present

## 2017-01-27 DIAGNOSIS — I252 Old myocardial infarction: Secondary | ICD-10-CM | POA: Diagnosis not present

## 2017-01-27 DIAGNOSIS — Z86718 Personal history of other venous thrombosis and embolism: Secondary | ICD-10-CM | POA: Diagnosis not present

## 2017-01-27 DIAGNOSIS — I482 Chronic atrial fibrillation, unspecified: Secondary | ICD-10-CM

## 2017-01-27 DIAGNOSIS — I739 Peripheral vascular disease, unspecified: Secondary | ICD-10-CM | POA: Diagnosis not present

## 2017-01-27 DIAGNOSIS — E785 Hyperlipidemia, unspecified: Secondary | ICD-10-CM | POA: Diagnosis not present

## 2017-01-27 DIAGNOSIS — Z8673 Personal history of transient ischemic attack (TIA), and cerebral infarction without residual deficits: Secondary | ICD-10-CM | POA: Diagnosis not present

## 2017-01-27 DIAGNOSIS — Z7901 Long term (current) use of anticoagulants: Secondary | ICD-10-CM | POA: Insufficient documentation

## 2017-01-27 DIAGNOSIS — Z8249 Family history of ischemic heart disease and other diseases of the circulatory system: Secondary | ICD-10-CM | POA: Insufficient documentation

## 2017-01-27 LAB — CBC
HEMATOCRIT: 37.9 % — AB (ref 39.0–52.0)
Hemoglobin: 12 g/dL — ABNORMAL LOW (ref 13.0–17.0)
MCH: 29.6 pg (ref 26.0–34.0)
MCHC: 31.7 g/dL (ref 30.0–36.0)
MCV: 93.6 fL (ref 78.0–100.0)
PLATELETS: 176 10*3/uL (ref 150–400)
RBC: 4.05 MIL/uL — ABNORMAL LOW (ref 4.22–5.81)
RDW: 14.5 % (ref 11.5–15.5)
WBC: 9.6 10*3/uL (ref 4.0–10.5)

## 2017-01-27 LAB — BASIC METABOLIC PANEL WITH GFR
Anion gap: 7 (ref 5–15)
BUN: 26 mg/dL — ABNORMAL HIGH (ref 6–20)
CO2: 30 mmol/L (ref 22–32)
Calcium: 9.2 mg/dL (ref 8.9–10.3)
Chloride: 102 mmol/L (ref 101–111)
Creatinine, Ser: 1.28 mg/dL — ABNORMAL HIGH (ref 0.61–1.24)
GFR calc Af Amer: >60 mL/min
GFR calc non Af Amer: 55 mL/min — ABNORMAL LOW
Glucose, Bld: 260 mg/dL — ABNORMAL HIGH (ref 65–99)
Potassium: 5.9 mmol/L — ABNORMAL HIGH (ref 3.5–5.1)
Sodium: 139 mmol/L (ref 135–145)

## 2017-01-27 LAB — LIPID PANEL
Cholesterol: 138 mg/dL (ref 0–200)
HDL: 34 mg/dL — ABNORMAL LOW (ref >40)
LDL CALC: 72 mg/dL (ref 0–99)
TRIGLYCERIDES: 158 mg/dL — AB (ref <150)
Total CHOL/HDL Ratio: 4.1 RATIO
VLDL: 32 mg/dL (ref 0–40)

## 2017-01-27 LAB — BRAIN NATRIURETIC PEPTIDE: B Natriuretic Peptide: 136.7 pg/mL — ABNORMAL HIGH (ref 0.0–100.0)

## 2017-01-27 LAB — DIGOXIN LEVEL: Digoxin Level: <0.2 ng/mL — ABNORMAL LOW (ref 0.8–2.0)

## 2017-01-27 MED ORDER — FUROSEMIDE 10 MG/ML IJ SOLN
80.0000 mg | Freq: Once | INTRAMUSCULAR | Status: AC
  Administered 2017-01-27: 80 mg via INTRAVENOUS
  Filled 2017-01-27: qty 8

## 2017-01-27 MED ORDER — FUROSEMIDE 40 MG PO TABS: 40.0000 mg | ORAL_TABLET | Freq: Two times a day (BID) | ORAL | 3 refills | Status: DC

## 2017-01-27 MED ORDER — POTASSIUM CHLORIDE CRYS ER 20 MEQ PO TBCR
20.0000 meq | EXTENDED_RELEASE_TABLET | Freq: Once | ORAL | Status: AC
  Administered 2017-01-27: 20 meq via ORAL
  Filled 2017-01-27: qty 1
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
## 2017-01-27 NOTE — Progress Notes (Signed)
80 mg IV lasix given to patient with 20 of potassium.  IV removed prior to patient leaving clinic.  Site was dry and intact.  Patient had not voided prior to leaving clinic but Dr. Shirlee Latch is aware.

## 2017-01-27 NOTE — Progress Notes (Signed)
24 g x 1 attempt inserted to patient's right posterior hand for iv lasix admin per Dr. Alford Highland VO. Patient toelrated well, saline locked, RN in CHF clinic made aware patient ready in room D with call bell and family at side.  Ave Filter, RN

## 2017-01-27 NOTE — Patient Instructions (Addendum)
Labs today (will call for abnormal results, otherwise no news is good news)  Take Lasix 60 mg (1.5 Tabs) Twice Daily tomorrow and Friday.  Then decrease to 40 mg (1 Tablet) Twice daily stating Saturday.  Take Carvedilol 6.25 mg Twice Daily  Follow up Appointment is scheduled for Wednesday August 29th @ 9:00 AM

## 2017-01-28 ENCOUNTER — Ambulatory Visit: Payer: Medicare Other | Admitting: Dietician

## 2017-01-29 ENCOUNTER — Telehealth (HOSPITAL_COMMUNITY): Payer: Self-pay

## 2017-01-29 MED ORDER — SPIRONOLACTONE 25 MG PO TABS: 12.5000 mg | ORAL_TABLET | Freq: Every day | ORAL | 3 refills | Status: DC

## 2017-01-29 NOTE — Telephone Encounter (Signed)
Digoxin level  Order: [id number]  Status:  Final result Visible to patient:  No (Not Released) Dx:  Chronic systolic CHF (congestive hear...  Notes recorded by Chyrl Civatte, RN on 01/29/2017 at 2:57 PM EDT Spoke with patient's daughter crystal who assures that patient has not had any supplemental potassium through supplements, foods, or drinks. Educated on high potassium foods and drinks and how to assess food labels for this. Patient's daughter reports patient is not better from his visit this past week in CHF clinic, though he is no longer coughing. Daughter states unable to get lab rechecked today as she is in [place]. Advised of urgency on this lab, patient and daughter would rather recheck on Monday. Advised to come in for his 1 week follow up mondya instead of Wednesday as he is no better symptomatically. Also advised to report to ED for any worsening s/s. Aware and agreeable. ------  Notes recorded by Georgina Peer, RN on 01/28/2017 at 1:26 PM EDT Called and left another VM asking for him to return our call. ------  Notes recorded by Georgina Peer, RN on 01/27/2017 at 4:32 PM EDT Teresa Coombs, RN called patient again this afternoon but no answer and left VM to call us back. ------  Notes recorded by Georgina Peer, RN on 01/27/2017 at 2:16 PM EDT Called and left message asking for him to call us back. ------  Notes recorded by [name], MD on 01/27/2017 at 12:46 PM EDT Stop any supplemental KCl. We will be increasing Lasix so let's check BMET again on Friday.

## 2017-01-29 NOTE — Telephone Encounter (Signed)
Advised per Dr. Shirlee Latch to also have patient cut spironolactone to 12.5 mg once daily. Patient aware and agreeable.  Ave Filter, RN

## 2017-02-01 ENCOUNTER — Encounter (HOSPITAL_COMMUNITY): Payer: Self-pay

## 2017-02-01 ENCOUNTER — Ambulatory Visit (HOSPITAL_COMMUNITY)
Admission: RE | Admit: 2017-02-01 | Discharge: 2017-02-01 | Disposition: A | Discharge status: Home / Self Care | Payer: Medicare Other | Source: Ambulatory Visit | Attending: Cardiology | Admitting: Cardiology

## 2017-02-01 VITALS — BP 110/62 | HR 84 | Wt 178.8 lb

## 2017-02-01 DIAGNOSIS — E785 Hyperlipidemia, unspecified: Secondary | ICD-10-CM | POA: Diagnosis not present

## 2017-02-01 DIAGNOSIS — I739 Peripheral vascular disease, unspecified: Secondary | ICD-10-CM | POA: Diagnosis not present

## 2017-02-01 DIAGNOSIS — M25531 Pain in right wrist: Secondary | ICD-10-CM | POA: Insufficient documentation

## 2017-02-01 DIAGNOSIS — F319 Bipolar disorder, unspecified: Secondary | ICD-10-CM | POA: Insufficient documentation

## 2017-02-01 DIAGNOSIS — I2582 Chronic total occlusion of coronary artery: Secondary | ICD-10-CM | POA: Insufficient documentation

## 2017-02-01 DIAGNOSIS — F1011 Alcohol abuse, in remission: Secondary | ICD-10-CM | POA: Diagnosis not present

## 2017-02-01 DIAGNOSIS — I2510 Atherosclerotic heart disease of native coronary artery without angina pectoris: Secondary | ICD-10-CM | POA: Insufficient documentation

## 2017-02-01 DIAGNOSIS — Z8673 Personal history of transient ischemic attack (TIA), and cerebral infarction without residual deficits: Secondary | ICD-10-CM | POA: Insufficient documentation

## 2017-02-01 DIAGNOSIS — Z955 Presence of coronary angioplasty implant and graft: Secondary | ICD-10-CM | POA: Insufficient documentation

## 2017-02-01 DIAGNOSIS — I482 Chronic atrial fibrillation, unspecified: Secondary | ICD-10-CM

## 2017-02-01 DIAGNOSIS — J449 Chronic obstructive pulmonary disease, unspecified: Secondary | ICD-10-CM | POA: Insufficient documentation

## 2017-02-01 DIAGNOSIS — Z9889 Other specified postprocedural states: Secondary | ICD-10-CM | POA: Diagnosis not present

## 2017-02-01 DIAGNOSIS — Z9981 Dependence on supplemental oxygen: Secondary | ICD-10-CM | POA: Insufficient documentation

## 2017-02-01 DIAGNOSIS — Z952 Presence of prosthetic heart valve: Secondary | ICD-10-CM | POA: Insufficient documentation

## 2017-02-01 DIAGNOSIS — I426 Alcoholic cardiomyopathy: Secondary | ICD-10-CM | POA: Insufficient documentation

## 2017-02-01 DIAGNOSIS — I110 Hypertensive heart disease with heart failure: Secondary | ICD-10-CM | POA: Diagnosis not present

## 2017-02-01 DIAGNOSIS — Z86718 Personal history of other venous thrombosis and embolism: Secondary | ICD-10-CM | POA: Diagnosis not present

## 2017-02-01 DIAGNOSIS — Z7902 Long term (current) use of antithrombotics/antiplatelets: Secondary | ICD-10-CM | POA: Diagnosis not present

## 2017-02-01 DIAGNOSIS — F17210 Nicotine dependence, cigarettes, uncomplicated: Secondary | ICD-10-CM | POA: Insufficient documentation

## 2017-02-01 DIAGNOSIS — Z8249 Family history of ischemic heart disease and other diseases of the circulatory system: Secondary | ICD-10-CM | POA: Diagnosis not present

## 2017-02-01 DIAGNOSIS — Z7984 Long term (current) use of oral hypoglycemic drugs: Secondary | ICD-10-CM | POA: Diagnosis not present

## 2017-02-01 DIAGNOSIS — I5022 Chronic systolic (congestive) heart failure: Secondary | ICD-10-CM | POA: Diagnosis not present

## 2017-02-01 LAB — BASIC METABOLIC PANEL
ANION GAP: 7 (ref 5–15)
BUN: 33 mg/dL — ABNORMAL HIGH (ref 6–20)
CO2: 34 mmol/L — AB (ref 22–32)
CREATININE: 1.52 mg/dL — AB (ref 0.61–1.24)
Calcium: 10.2 mg/dL (ref 8.9–10.3)
Chloride: 97 mmol/L — ABNORMAL LOW (ref 101–111)
GFR, EST AFRICAN AMERICAN: 52 mL/min — AB (ref >60)
GFR, EST NON AFRICAN AMERICAN: 45 mL/min — AB (ref >60)
GLUCOSE: 268 mg/dL — AB (ref 65–99)
POTASSIUM: 4.2 mmol/L (ref 3.5–5.1)
Sodium: 138 mmol/L (ref 135–145)

## 2017-02-01 LAB — BRAIN NATRIURETIC PEPTIDE: B NATRIURETIC PEPTIDE 5: 102.6 pg/mL — AB (ref 0.0–100.0)

## 2017-02-01 MED ORDER — FUROSEMIDE 40 MG PO TABS: 40.0000 mg | ORAL_TABLET | Freq: Two times a day (BID) | ORAL | 3 refills | Status: DC

## 2017-02-01 NOTE — Patient Instructions (Signed)
Labs today (will call for abnormal results, otherwise no news is good news)  INCREASE Lasix to 40 mg Twice Daily  You have been referred to Paramedicine, they will contact you to schedule initial visit.  Follow up in 1 month
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
## 2017-02-02 ENCOUNTER — Encounter: Payer: Self-pay | Admitting: Cardiology

## 2017-02-02 ENCOUNTER — Telehealth (HOSPITAL_COMMUNITY): Payer: Self-pay | Admitting: *Deleted

## 2017-02-02 NOTE — Telephone Encounter (Signed)
Pt's daughter called earlier today to ask about Entresto dose, she thought it was increased to 97/103 mg BID but it was not on his paperwork.  Discussed w/Amy clegg, NP she states w/elevated kidney function pt should continue Entresto 49/51 mg BID for now.  Attempted to call [name] back and Left message to call back

## 2017-02-03 ENCOUNTER — Encounter (HOSPITAL_COMMUNITY): Payer: Medicare Other

## 2017-02-03 NOTE — Telephone Encounter (Signed)
Spoke w/[name], she is aware of Entresto dose, she states she did pick up the 97/103 mg tabs and started giving to pt yesterday, she states it cost $170 and she can not afford a new prescription.  Advised we will give pt 1 month supply of samples, she will come by to p/u 49/51 mg tabs.  Will have Erika, Pharm D f/u with her about possibly enrolling into PAN foundation for cost of Entresto.  Medication Samples have been provided to the patient.  Drug name: Sherryll Burger       Strength: 49/51 mg        Qty: 4  LOT: [id number]  Exp.Date: 8/19  Dosing instructions: 1 tab Twice daily   The patient has been instructed regarding the correct time, dose, and frequency of taking this medication, including desired effects and most common side effects.   Rohail Klees 1:57 PM 02/03/2017

## 2017-02-04 ENCOUNTER — Other Ambulatory Visit (HOSPITAL_COMMUNITY): Payer: Self-pay | Admitting: Pharmacist

## 2017-02-04 ENCOUNTER — Other Ambulatory Visit: Payer: Self-pay | Admitting: Internal Medicine

## 2017-02-04 MED ORDER — SACUBITRIL-VALSARTAN 49-51 MG PO TABS: 1.0000 | ORAL_TABLET | Freq: Two times a day (BID) | ORAL | 11 refills | Status: DC

## 2017-02-04 NOTE — Telephone Encounter (Signed)
Spoke with [name] who will have her father sign the [employer] PAF application when she comes to pick up samples since PAN foundation currently closed.   Tyler Deis. Bonnye Fava, PharmD, BCPS, CPP Clinical Pharmacist Pager: [phone] Phone: [phone] 02/04/2017 4:45 PM

## 2017-02-09 ENCOUNTER — Telehealth (HOSPITAL_COMMUNITY): Payer: Self-pay | Admitting: Surgery

## 2017-02-09 NOTE — Telephone Encounter (Signed)
[name] has been referred to the HF [facility] program.  The appropriate paperwork has been sent via secure email to the Paramedic team.

## 2017-02-10 ENCOUNTER — Encounter (HOSPITAL_COMMUNITY): Payer: Medicare Other

## 2017-02-12 ENCOUNTER — Other Ambulatory Visit (HOSPITAL_COMMUNITY): Payer: Self-pay

## 2017-02-12 ENCOUNTER — Ambulatory Visit (HOSPITAL_COMMUNITY)
Admission: RE | Admit: 2017-02-12 | Discharge: 2017-02-12 | Disposition: A | Discharge status: Home / Self Care | Payer: Medicare Other | Source: Ambulatory Visit | Attending: Internal Medicine | Admitting: Internal Medicine

## 2017-02-12 ENCOUNTER — Encounter (HOSPITAL_COMMUNITY): Payer: Self-pay

## 2017-02-12 VITALS — BP 120/64 | HR 76 | Temp 97.6°F | Wt 175.5 lb

## 2017-02-12 DIAGNOSIS — Z720 Tobacco use: Secondary | ICD-10-CM

## 2017-02-12 DIAGNOSIS — Z8673 Personal history of transient ischemic attack (TIA), and cerebral infarction without residual deficits: Secondary | ICD-10-CM | POA: Diagnosis not present

## 2017-02-12 DIAGNOSIS — J449 Chronic obstructive pulmonary disease, unspecified: Secondary | ICD-10-CM | POA: Diagnosis not present

## 2017-02-12 DIAGNOSIS — I2510 Atherosclerotic heart disease of native coronary artery without angina pectoris: Secondary | ICD-10-CM | POA: Diagnosis not present

## 2017-02-12 DIAGNOSIS — I739 Peripheral vascular disease, unspecified: Secondary | ICD-10-CM | POA: Insufficient documentation

## 2017-02-12 DIAGNOSIS — I214 Non-ST elevation (NSTEMI) myocardial infarction: Secondary | ICD-10-CM | POA: Diagnosis not present

## 2017-02-12 DIAGNOSIS — J9692 Respiratory failure, unspecified with hypercapnia: Secondary | ICD-10-CM | POA: Insufficient documentation

## 2017-02-12 DIAGNOSIS — E119 Type 2 diabetes mellitus without complications: Secondary | ICD-10-CM | POA: Diagnosis not present

## 2017-02-12 DIAGNOSIS — F17200 Nicotine dependence, unspecified, uncomplicated: Secondary | ICD-10-CM | POA: Insufficient documentation

## 2017-02-12 DIAGNOSIS — Z8249 Family history of ischemic heart disease and other diseases of the circulatory system: Secondary | ICD-10-CM | POA: Diagnosis not present

## 2017-02-12 DIAGNOSIS — Z7984 Long term (current) use of oral hypoglycemic drugs: Secondary | ICD-10-CM | POA: Insufficient documentation

## 2017-02-12 DIAGNOSIS — I482 Chronic atrial fibrillation, unspecified: Secondary | ICD-10-CM

## 2017-02-12 DIAGNOSIS — I110 Hypertensive heart disease with heart failure: Secondary | ICD-10-CM | POA: Diagnosis not present

## 2017-02-12 DIAGNOSIS — F319 Bipolar disorder, unspecified: Secondary | ICD-10-CM | POA: Insufficient documentation

## 2017-02-12 DIAGNOSIS — I059 Rheumatic mitral valve disease, unspecified: Secondary | ICD-10-CM

## 2017-02-12 DIAGNOSIS — I426 Alcoholic cardiomyopathy: Secondary | ICD-10-CM | POA: Insufficient documentation

## 2017-02-12 DIAGNOSIS — I2582 Chronic total occlusion of coronary artery: Secondary | ICD-10-CM | POA: Diagnosis not present

## 2017-02-12 DIAGNOSIS — E785 Hyperlipidemia, unspecified: Secondary | ICD-10-CM | POA: Diagnosis not present

## 2017-02-12 DIAGNOSIS — Z955 Presence of coronary angioplasty implant and graft: Secondary | ICD-10-CM | POA: Diagnosis not present

## 2017-02-12 DIAGNOSIS — Z9889 Other specified postprocedural states: Secondary | ICD-10-CM | POA: Diagnosis not present

## 2017-02-12 DIAGNOSIS — Z7901 Long term (current) use of anticoagulants: Secondary | ICD-10-CM | POA: Insufficient documentation

## 2017-02-12 DIAGNOSIS — E1159 Type 2 diabetes mellitus with other circulatory complications: Secondary | ICD-10-CM | POA: Diagnosis not present

## 2017-02-12 DIAGNOSIS — I5023 Acute on chronic systolic (congestive) heart failure: Secondary | ICD-10-CM | POA: Diagnosis present

## 2017-02-12 DIAGNOSIS — I5022 Chronic systolic (congestive) heart failure: Secondary | ICD-10-CM | POA: Diagnosis not present

## 2017-02-12 DIAGNOSIS — I10 Essential (primary) hypertension: Secondary | ICD-10-CM | POA: Diagnosis not present

## 2017-02-12 LAB — BASIC METABOLIC PANEL
ANION GAP: 8 (ref 5–15)
BUN: 25 mg/dL — AB (ref 6–20)
CALCIUM: 9.2 mg/dL (ref 8.9–10.3)
CO2: 28 mmol/L (ref 22–32)
Chloride: 100 mmol/L — ABNORMAL LOW (ref 101–111)
Creatinine, Ser: 1.43 mg/dL — ABNORMAL HIGH (ref 0.61–1.24)
GFR calc Af Amer: 56 mL/min — ABNORMAL LOW (ref >60)
GFR, EST NON AFRICAN AMERICAN: 48 mL/min — AB (ref >60)
Glucose, Bld: 373 mg/dL — ABNORMAL HIGH (ref 65–99)
POTASSIUM: 4.6 mmol/L (ref 3.5–5.1)
SODIUM: 136 mmol/L (ref 135–145)

## 2017-02-12 LAB — HEMOGLOBIN A1C
HEMOGLOBIN A1C: 7.8 % — AB (ref 4.8–5.6)
Mean Plasma Glucose: 177.16 mg/dL

## 2017-02-12 LAB — LIPID PANEL
Cholesterol: 151 (ref 0–200)
HDL: 37 (ref 35–70)
LDL Cholesterol: 71
Triglycerides: 216 — AB (ref 40–160)

## 2017-02-12 MED ORDER — APIXABAN 5 MG PO TABS: 5.0000 mg | ORAL_TABLET | Freq: Two times a day (BID) | ORAL | 3 refills | Status: DC

## 2017-02-12 MED ORDER — SACUBITRIL-VALSARTAN 49-51 MG PO TABS: 1.0000 | ORAL_TABLET | Freq: Two times a day (BID) | ORAL | 11 refills | Status: DC
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
## 2017-02-12 NOTE — Progress Notes (Signed)
Advanced Heart Failure Medication Review by a Pharmacist  Does the patient  feel that his/her medications are working for him/her?  yes  Has the patient been experiencing any side effects to the medications prescribed?  no  Does the patient measure his/her own blood pressure or blood glucose at home?  no   Does the patient have any problems obtaining medications due to transportation or finances?   Yes - enrolled in PANF and given application for BMS PAF for Eliquis  Understanding of regimen: fair Understanding of indications: fair Potential of compliance: fair Patient understands to avoid NSAIDs. Patient understands to avoid decongestants.  Issues to address at subsequent visits: None   Pharmacist comments: [name] is a pleasant 71 yo M presenting with his daughter, granddaughter and Timothy Lasso (paramedicine). They report good compliance with his regimen and did not have any specific medication-related questions or concerns for me at this time.   PANF: Member ID: [id number] Group ID: [id number] RxBin ID: [id number] PCN: [id number] Eligibility Start Date: 11/14/2016 Eligibility End Date: 02/11/2018 Assistance Amount: $800.00    Cicero Duck K. Bonnye Fava, PharmD, BCPS, CPP Clinical Pharmacist Pager: [phone] Phone: [phone] 02/12/2017 1:50 PM      Time with patient: 15 minutes Preparation and documentation time: 10 minutes Total time: 25 minutes

## 2017-02-12 NOTE — Patient Instructions (Signed)
Routine lab work today. Will notify you of abnormal results, otherwise no news is good news!  Follow up 2 months.  __________________________________________________________________  __________________________________________________________________  Take all medication as prescribed the day of your appointment. Bring all medications with you to your appointment.  Do the following things EVERYDAY: 1) Weigh yourself in the morning before breakfast. Write it down and keep it in a log. 2) Take your medicines as prescribed 3) Eat low salt foods-Limit salt (sodium) to 2000 mg per day.  4) Stay as active as you can everyday 5) Limit all fluids for the day to less than 2 liters

## 2017-02-12 NOTE — Progress Notes (Signed)
Paramedicine Encounter   Patient ID: [name] , male,   DOB: [birth year],71 y.o.,  MRN: [id number]   Met patient in clinic today with provider for our initial visit. I explained the paramedicine program to him and his family and they stated they were interested. I will contact [name] in the coming week.   Time spent with patient: 28 minutes  Jacquiline Doe, EMT 02/12/2017   ACTION: Next visit planned for 1 week

## 2017-02-15 ENCOUNTER — Other Ambulatory Visit: Payer: Self-pay | Admitting: Cardiology

## 2017-02-17 ENCOUNTER — Other Ambulatory Visit (HOSPITAL_COMMUNITY): Payer: Self-pay

## 2017-02-17 NOTE — Progress Notes (Signed)
Paramedicine Encounter    Patient ID: [name], male    DOB: 01/29/1946, 71 y.o.   MRN: [id number]   Patient Care Team: Henrine Screws, MD as PCP - General (Family Medicine) [name], MD as Consulting Physician (Cardiology) [name], MD as Consulting Physician (Cardiology)  Patient Active Problem List   Diagnosis Date Noted  . Acute respiratory failure with hypoxia and hypercapnia (HCC)   . COPD GOLD 0 still smoking  03/02/2016  . Chronic kidney disease (CKD), stage II (mild) 11/23/2015  . CAD (coronary artery disease)   . Dementia   . Chronic respiratory failure with hypoxia (HCC) 10/11/2015  . Chronic systolic CHF (congestive heart failure) (HCC) 05/30/2015  . Cigarette smoker 12/24/2014  . Stroke (HCC)   . Mitral valve disease   . Depression   . Mixed Ischemic and Nonischemic Cardiomyopathy   . Alcohol abuse   . Chronic anticoagulation 08/22/2014  . PVD (peripheral vascular disease) (HCC) 02/28/2014  . Mural thrombus of heart 02/07/2014  . Hypertension 06/14/2012  . Automatic implantable cardioverter-defibrillator in situ 04/04/2012  . Hyperlipidemia 10/27/2011  . Ventricular tachycardia (HCC) 07/14/2011  . Atrial fibrillation, chronic (HCC) 07/13/2011  . Right atrial thrombus (HCC) 07/11/2011    Current Outpatient Prescriptions:  .  albuterol (PROAIR HFA) 108 (90 Base) MCG/ACT inhaler, 2 puffs every 4 hours as needed only  if your can't catch your breath, Disp: 1 Inhaler, Rfl: 1 .  apixaban (ELIQUIS) 5 MG TABS tablet, Take 1 tablet (5 mg total) by mouth 2 (two) times daily., Disp: 180 tablet, Rfl: 3 .  atorvastatin (LIPITOR) 40 MG tablet, Take 1 tablet (40 mg total) by mouth daily., Disp: 30 tablet, Rfl: 0 .  carvedilol (COREG) 6.25 MG tablet, Take 1 tablet (6.25 mg total) by mouth 2 (two) times daily., Disp: 60 tablet, Rfl: 11 .  fenofibrate (TRICOR) 48 MG tablet, TAKE 1 TABLET BY MOUTH EVERY DAY, Disp: 90 tablet, Rfl: 1 .  furosemide (LASIX) 40 MG  tablet, Take 1 tablet (40 mg total) by mouth 2 (two) times daily., Disp: 60 tablet, Rfl: 3 .  glipiZIDE (GLUCOTROL XL) 2.5 MG 24 hr tablet, Take 2.5 mg by mouth daily with breakfast., Disp: , Rfl:  .  Multiple Vitamin (MULTIVITAMIN WITH MINERALS) TABS tablet, Take 1 tablet by mouth daily., Disp: 30 tablet, Rfl: 2 .  sacubitril-valsartan (ENTRESTO) 49-51 MG, Take 1 tablet by mouth 2 (two) times daily., Disp: 60 tablet, Rfl: 11 .  spironolactone (ALDACTONE) 25 MG tablet, Take 0.5 tablets (12.5 mg total) by mouth daily., Disp: 45 tablet, Rfl: 3 .  nitroGLYCERIN (NITROSTAT) 0.4 MG SL tablet, Place 1 tablet (0.4 mg total) under the tongue every 5 (five) minutes as needed for chest pain. (Patient not taking: Reported on 02/01/2017), Disp: 25 tablet, Rfl: 2 .  ranitidine (ZANTAC) 150 MG tablet, Take 150 mg by mouth daily as needed for heartburn., Disp: , Rfl:  .  thiamine 100 MG tablet, Take 1 tablet (100 mg total) by mouth daily. (Patient not taking: Reported on 02/17/2017), Disp: 30 tablet, Rfl: 0 Allergies  Allergen Reactions  . Warfarin Other (See Comments)    Non compliance and ETOH abuse     Social History   Social History  . Marital status: Divorced    Spouse name: N/A  . Number of children: N/A  . Years of education: N/A   Occupational History  . Retired Therapist, music    Social History Main Topics  . Smoking status: Current  Every Day Smoker    Packs/day: 1.00    Years: 42.00    Types: Cigarettes  . Smokeless tobacco: Never Used  . Alcohol use 0.0 oz/week     Comment: 11/22/2015 "6 pack of beer/month"  . Drug use: No  . Sexual activity: Not Currently   Other Topics Concern  . Not on file   Social History Narrative   ** Merged History Encounter **        Physical Exam  Constitutional: He is oriented to person, place, and time.  Cardiovascular: Normal rate and regular rhythm.   Pulmonary/Chest: Effort normal. No respiratory distress. He has rhonchi in the right lower field and  the left lower field.  Abdominal: Soft.  Musculoskeletal: Normal range of motion. He exhibits no edema.  Neurological: He is alert and oriented to person, place, and time.  Skin: Skin is warm and dry.  Psychiatric: He has a normal mood and affect.        SAFE - 02/17/17 1500      Situation   Admitting diagnosis CHF   Heart failure history Exisiting   Comorbidities Atrial fibillation;COPD;CVA;DM;HTN;Hx DVT/PE;Hx MI/CAD   Readmitted within 30 days No   Hospital admission within past 12 months Yes   number of hospital admissions 1     Assessment   Lives alone No   Primary support person Daughter   Mode of transportation personal car   Other services involved None   Home equipement Home O2;Scale     Weight   Weighs self daily No   Scale provided No   Records on weight chart No     Resources   Has "Living better w/heart failure" book No   Has HF Zone tool No   Able to identify yellow zone signs/when to call MD No   Records zone daily No     Medications   Uses a pill box Yes   Who stocks the pill box Daughter   Pill box checked this visit Yes   Pill box refilled this visit No   Difficulty obtaining medications No   Mail order medications No   Missed one or more doses of medications per week No     Nutrition   Patient receives meals on wheels No   Patient follows low sodium diet No   Has foods at home that meet the current recommended diet Yes   Patient follows low sugar/card diet No   Nutritional concerns/issues no     Activity Level   ADL's/Mobility Dependent   How many feet can patient ambulate 60 feet   Typical activity level sedentary     Urine   Difficulty urinating No   Changes in urine None     Time spent with patient   Time spent with patient  30 Minutes         Scientist, research (medical) Living Environment - 02/17/17 1500      Outside of House   Sidewalk and pathway to house is level and free from any hazards Yes   Driveway is free from  debris/snow/ice Yes   Outside stairs are stable and have sturdy handrail Yes   Porch lights are working and provide adequate lighting Yes     Living Room   Furniture is of adequate height and offers arm rests that assist in getting up and down Yes   Floor is free from any clutter that would create tripping hazards Yes   All cords are either behind furniture or  secured in a manner that does not cause trip hazards Yes   All rugs are secured to floor with double-sided tape Yes   Lighting is adequate to light room Yes   All lighting has an easily accessible on/off switch Yes   Phone is readily accessible near favorite seating areas Yes   Emergency numbers are printed near all phones in house Yes     Kitchen   Items used most often are within easy reach on low shelves Yes   Step stool is present, is sturdy and has a handrail Yes   Floor mats are non-slip tread and secured to floor N/A   Oven controls are within easy reach Yes   Kitchen lighting is adequate and easy to reach switches Yes   ABC fire extinguisher is located in kitchen Yes     Stairs   Carpet is properly secured to stairs and/or all wood is properly secured N/A   Handrail is present and sturdy N/A   Stairs are free from any clutter N/A   Stairway is adequately lit N/A     Bathroom   Tub and shower have a non-slip surface Yes   Tub and/or shower have a grab bar for stability No   Toilet has a raised seat Yes   Grab bar is attached near toilet for assistance No   Pathway from bedroom to bathroom is free from clutter and well lit for ease of movement in the middle of the night Yes     Bedroom   Floor is free from clutter Yes   Light is near bed and is easy to turn on Yes   Phone is next to bed and within easy reach No   Flashlight is near bed in case of emergency Yes     General   Smoke detectors in all areas of the house (each floor) and tested Yes   CO detectors on each floor of house and tested No   Flashlights are  handy throughout the home Yes   Resident has all medical information readily available and in an area emergency providers will easily find Yes   All heaters are away from any type of flammable material Yes     Overall Tips   Homeowner ha good non-skid shoes to move around house Yes   All assisted walking devices are readily accessible and in good condition N/A   There is a phone near the floor for ease of reach in case of a fall YES   All O2 tubing is less than 50 ft. and is not a trip hazard N/A   Resident has had an annual hearing and vision check by a physician Yes   Resident has the proper hearing and visual aids prescribed and are in good working order Yes   All medications are properly stored and labeled to avoid confusion on dosage, time to take, and avoidance of missed doses Yes       Future Appointments Date Time Provider Department Center  04/14/2017 9:30 AM [facility] PA/NP [facility] None  04/14/2017 3:00 PM [facility] 2 [facility]  04/14/2017 3:45 PM Nickel, Carma Lair, NP [facility]  04/21/2017 10:15 AM [facility] DEVICE REMOTES [facility]   BP 108/68 (BP Location: Right Arm, Patient Position: Sitting, Cuff Size: Normal)   Pulse 67   Resp 18   Wt 170 lb (77.1 kg)   SpO2 93%   BMI 26.63 kg/m  Weight yesterday-Did not weigh Last visit weight- N/A  [name] was seen at home today and reports feeling well. He denied SOB while at rest, dizziness or headache. His daughter answered most questions for him and stated she also handles filling his pillbox. His medications were verified and discrepancies noted. The daughter did say she wants to inquire about getting the patient portable oxygen because he becomes SOB with exertion.   Time spent with patient: 58 minutes  Jacqualine Code, EMT 02/17/17  ACTION: Home visit completed Next visit planned for 1 week

## 2017-02-25 ENCOUNTER — Encounter (HOSPITAL_COMMUNITY): Payer: Medicare Other

## 2017-02-25 ENCOUNTER — Telehealth (HOSPITAL_COMMUNITY): Payer: Self-pay

## 2017-02-25 NOTE — Telephone Encounter (Signed)
I called to schedule an appointment with [name]. I spoke with his daughter and she advised that this week would not be good to meet because they were helping a family member move and would not be home. I asked about next week and she stated that would be a better option and to call back then to schedule a visit.

## 2017-03-01 ENCOUNTER — Encounter (HOSPITAL_COMMUNITY): Payer: Medicare Other

## 2017-03-02 ENCOUNTER — Other Ambulatory Visit (HOSPITAL_COMMUNITY): Payer: Self-pay | Admitting: Cardiology

## 2017-03-04 ENCOUNTER — Telehealth (HOSPITAL_COMMUNITY): Payer: Self-pay

## 2017-03-04 NOTE — Telephone Encounter (Signed)
[name] was called to schedule a home visit. I left a voicemail with my name and phone number and requested they call me back to schedule an appointment.

## 2017-03-05 ENCOUNTER — Other Ambulatory Visit (HOSPITAL_COMMUNITY): Payer: Self-pay

## 2017-03-05 NOTE — Progress Notes (Signed)
Paramedicine Encounter    Patient ID: [name], male    DOB: 1945-07-24, 71 y.o.   MRN: [id number]   Patient Care Team: Henrine Screws, MD as PCP - General (Family Medicine) [name], MD as Consulting Physician (Cardiology) [name], MD as Consulting Physician (Cardiology)  Patient Active Problem List   Diagnosis Date Noted  . Acute respiratory failure with hypoxia and hypercapnia (HCC)   . COPD GOLD 0 still smoking  03/02/2016  . Chronic kidney disease (CKD), stage II (mild) 11/23/2015  . CAD (coronary artery disease)   . Dementia   . Chronic respiratory failure with hypoxia (HCC) 10/11/2015  . Chronic systolic CHF (congestive heart failure) (HCC) 05/30/2015  . Cigarette smoker 12/24/2014  . Stroke (HCC)   . Mitral valve disease   . Depression   . Mixed Ischemic and Nonischemic Cardiomyopathy   . Alcohol abuse   . Chronic anticoagulation 08/22/2014  . PVD (peripheral vascular disease) (HCC) 02/28/2014  . Mural thrombus of heart 02/07/2014  . Hypertension 06/14/2012  . Automatic implantable cardioverter-defibrillator in situ 04/04/2012  . Hyperlipidemia 10/27/2011  . Ventricular tachycardia (HCC) 07/14/2011  . Atrial fibrillation, chronic (HCC) 07/13/2011  . Right atrial thrombus (HCC) 07/11/2011    Current Outpatient Prescriptions:  .  albuterol (PROAIR HFA) 108 (90 Base) MCG/ACT inhaler, 2 puffs every 4 hours as needed only  if your can't catch your breath, Disp: 1 Inhaler, Rfl: 1 .  apixaban (ELIQUIS) 5 MG TABS tablet, Take 1 tablet (5 mg total) by mouth 2 (two) times daily., Disp: 180 tablet, Rfl: 3 .  atorvastatin (LIPITOR) 40 MG tablet, Take 1 tablet (40 mg total) by mouth daily., Disp: 30 tablet, Rfl: 0 .  carvedilol (COREG) 6.25 MG tablet, Take 1 tablet (6.25 mg total) by mouth 2 (two) times daily., Disp: 60 tablet, Rfl: 11 .  ELIQUIS 5 MG TABS tablet, TAKE 1 TABLET (5 MG TOTAL) BY MOUTH 2 (TWO) TIMES DAILY., Disp: 180 tablet, Rfl: 0 .   fenofibrate (TRICOR) 48 MG tablet, TAKE 1 TABLET BY MOUTH EVERY DAY, Disp: 90 tablet, Rfl: 1 .  furosemide (LASIX) 40 MG tablet, Take 1 tablet (40 mg total) by mouth 2 (two) times daily., Disp: 60 tablet, Rfl: 3 .  glipiZIDE (GLUCOTROL XL) 2.5 MG 24 hr tablet, Take 2.5 mg by mouth daily with breakfast., Disp: , Rfl:  .  Multiple Vitamin (MULTIVITAMIN WITH MINERALS) TABS tablet, Take 1 tablet by mouth daily., Disp: 30 tablet, Rfl: 2 .  nitroGLYCERIN (NITROSTAT) 0.4 MG SL tablet, Place 1 tablet (0.4 mg total) under the tongue every 5 (five) minutes as needed for chest pain. (Patient not taking: Reported on 02/01/2017), Disp: 25 tablet, Rfl: 2 .  ranitidine (ZANTAC) 150 MG tablet, Take 150 mg by mouth daily as needed for heartburn., Disp: , Rfl:  .  sacubitril-valsartan (ENTRESTO) 49-51 MG, Take 1 tablet by mouth 2 (two) times daily., Disp: 60 tablet, Rfl: 11 .  spironolactone (ALDACTONE) 25 MG tablet, Take 0.5 tablets (12.5 mg total) by mouth daily., Disp: 45 tablet, Rfl: 3 .  thiamine 100 MG tablet, Take 1 tablet (100 mg total) by mouth daily. (Patient not taking: Reported on 02/17/2017), Disp: 30 tablet, Rfl: 0 Allergies  Allergen Reactions  . Warfarin Other (See Comments)    Non compliance and ETOH abuse     Social History   Social History  . Marital status: Divorced    Spouse name: N/A  . Number of children: N/A  .  Years of education: N/A   Occupational History  . Retired Therapist, music    Social History Main Topics  . Smoking status: Current Every Day Smoker    Packs/day: 1.00    Years: 42.00    Types: Cigarettes  . Smokeless tobacco: Never Used  . Alcohol use 0.0 oz/week     Comment: 11/22/2015 "6 pack of beer/month"  . Drug use: No  . Sexual activity: Not Currently   Other Topics Concern  . Not on file   Social History Narrative   ** Merged History Encounter **        Physical Exam  Constitutional: He is oriented to person, place, and time.  Cardiovascular: Normal rate  and regular rhythm.   Pulmonary/Chest: Effort normal. No respiratory distress. He has decreased breath sounds in the left lower field. He has no wheezes. He has no rales.  Abdominal: Soft.  Musculoskeletal: Normal range of motion. He exhibits no edema.  Neurological: He is alert and oriented to person, place, and time.  Skin: Skin is warm and dry.  Psychiatric: He has a normal mood and affect.        SAFE - 02/17/17 1500      Situation   Admitting diagnosis CHF   Heart failure history Exisiting   Comorbidities Atrial fibillation;COPD;CVA;DM;HTN;Hx DVT/PE;Hx MI/CAD   Readmitted within 30 days No   Hospital admission within past 12 months Yes   number of hospital admissions 1     Assessment   Lives alone No   Primary support person Daughter   Mode of transportation personal car   Other services involved None   Home equipement Home O2;Scale     Weight   Weighs self daily No   Scale provided No   Records on weight chart No     Resources   Has "Living better w/heart failure" book No   Has HF Zone tool No   Able to identify yellow zone signs/when to call MD No   Records zone daily No     Medications   Uses a pill box Yes   Who stocks the pill box Daughter   Pill box checked this visit Yes   Pill box refilled this visit No   Difficulty obtaining medications No   Mail order medications No   Missed one or more doses of medications per week No     Nutrition   Patient receives meals on wheels No   Patient follows low sodium diet No   Has foods at home that meet the current recommended diet Yes   Patient follows low sugar/card diet No   Nutritional concerns/issues no     Activity Level   ADL's/Mobility Dependent   How many feet can patient ambulate 60 feet   Typical activity level sedentary     Urine   Difficulty urinating No   Changes in urine None     Time spent with patient   Time spent with patient  50 Minutes        Future Appointments Date Time  Provider Department Center  04/14/2017 9:30 AM [facility] PA/NP [facility] None  04/14/2017 3:00 PM [facility] 2 [facility]  04/14/2017 3:45 PM Nickel, Carma Lair, NP [facility]  04/21/2017 10:15 AM [facility] DEVICE REMOTES [facility]   BP 126/78 (BP Location: Left Arm, Patient Position: Sitting, Cuff Size: Normal)   Pulse 60   Resp 16   Wt 170 lb (77.1 kg)   SpO2 91%   BMI  26.63 kg/m  Weight yesterday- Did not weigh Last visit weight- 170 lb  [name] was seen at home today and reported feeling well. He denied SOB despite a low O2 saturation of 91%. He stated he is only supposed to be wearing oxygen at night when he sleeps but his daughter stated he had been wearing it all day because he was sleeping all day. He denied headache, dizziness, or orthopnea. His daughter on scene stated she had his medications handled and advised there had been no change since the last time I was at the house. I have a feeling she not interested in the program. I intend to talk to her about that more next week.   Time spent with patient: 30 minutes  Jacqualine Code, EMT 03/05/17  ACTION: Home visit completed Next visit planned for 1 week

## 2017-03-09 ENCOUNTER — Telehealth (HOSPITAL_COMMUNITY): Payer: Self-pay | Admitting: Pharmacist

## 2017-03-09 NOTE — Telephone Encounter (Signed)
Received a call from Jacona, [name]'s daughter, stating that his Eliquis is now too expensive for him. I have called her back and left a VM about the patient assistance program that he can sign up for. I would just need his signature and a print out from the pharmacy showing how much he has paid for his medications since 06/08/16.   Tyler Deis. Bonnye Fava, PharmD, BCPS, CPP Clinical Pharmacist Pager: [phone] Phone: [phone] 03/09/2017 2:48 PM

## 2017-03-11 ENCOUNTER — Telehealth (HOSPITAL_COMMUNITY): Payer: Self-pay | Admitting: *Deleted

## 2017-03-11 ENCOUNTER — Other Ambulatory Visit (HOSPITAL_COMMUNITY): Payer: Self-pay

## 2017-03-11 NOTE — Telephone Encounter (Signed)
Advanced Heart Failure Triage Encounter  Patient Name: [name]  Date of Call: 03/11/17  Problem:  Zack called to report pt's wt is up 5 lbs from last week, pt has been taking meds, Lasix 40 mg BID  Plan:  Discussed w/Dr Shirlee Latch he would like pt to increase Lasix to 80 mg BID for 3 days then take 60 mg BID.  Timothy Lasso is aware and will make pt aware.  Meredith Staggers, RN

## 2017-03-11 NOTE — Progress Notes (Signed)
Paramedicine Encounter    Patient ID: [name], male    DOB: 10-Jul-1945, 71 y.o.   MRN: [id number]   Patient Care Team: Henrine Screws, MD as PCP - General (Family Medicine) [name], MD as Consulting Physician (Cardiology) [name], MD as Consulting Physician (Cardiology)  Patient Active Problem List   Diagnosis Date Noted  . Acute respiratory failure with hypoxia and hypercapnia (HCC)   . COPD GOLD 0 still smoking  03/02/2016  . Chronic kidney disease (CKD), stage II (mild) 11/23/2015  . CAD (coronary artery disease)   . Dementia   . Chronic respiratory failure with hypoxia (HCC) 10/11/2015  . Chronic systolic CHF (congestive heart failure) (HCC) 05/30/2015  . Cigarette smoker 12/24/2014  . Stroke (HCC)   . Mitral valve disease   . Depression   . Mixed Ischemic and Nonischemic Cardiomyopathy   . Alcohol abuse   . Chronic anticoagulation 08/22/2014  . PVD (peripheral vascular disease) (HCC) 02/28/2014  . Mural thrombus of heart 02/07/2014  . Hypertension 06/14/2012  . Automatic implantable cardioverter-defibrillator in situ 04/04/2012  . Hyperlipidemia 10/27/2011  . Ventricular tachycardia (HCC) 07/14/2011  . Atrial fibrillation, chronic (HCC) 07/13/2011  . Right atrial thrombus (HCC) 07/11/2011    Current Outpatient Prescriptions:  .  albuterol (PROAIR HFA) 108 (90 Base) MCG/ACT inhaler, 2 puffs every 4 hours as needed only  if your can't catch your breath, Disp: 1 Inhaler, Rfl: 1 .  apixaban (ELIQUIS) 5 MG TABS tablet, Take 1 tablet (5 mg total) by mouth 2 (two) times daily., Disp: 180 tablet, Rfl: 3 .  atorvastatin (LIPITOR) 40 MG tablet, Take 1 tablet (40 mg total) by mouth daily., Disp: 30 tablet, Rfl: 0 .  carvedilol (COREG) 6.25 MG tablet, Take 1 tablet (6.25 mg total) by mouth 2 (two) times daily., Disp: 60 tablet, Rfl: 11 .  ELIQUIS 5 MG TABS tablet, TAKE 1 TABLET (5 MG TOTAL) BY MOUTH 2 (TWO) TIMES DAILY., Disp: 180 tablet, Rfl: 0 .   fenofibrate (TRICOR) 48 MG tablet, TAKE 1 TABLET BY MOUTH EVERY DAY, Disp: 90 tablet, Rfl: 1 .  furosemide (LASIX) 40 MG tablet, Take 1 tablet (40 mg total) by mouth 2 (two) times daily., Disp: 60 tablet, Rfl: 3 .  glipiZIDE (GLUCOTROL XL) 2.5 MG 24 hr tablet, Take 2.5 mg by mouth daily with breakfast., Disp: , Rfl:  .  Multiple Vitamin (MULTIVITAMIN WITH MINERALS) TABS tablet, Take 1 tablet by mouth daily., Disp: 30 tablet, Rfl: 2 .  nitroGLYCERIN (NITROSTAT) 0.4 MG SL tablet, Place 1 tablet (0.4 mg total) under the tongue every 5 (five) minutes as needed for chest pain., Disp: 25 tablet, Rfl: 2 .  ranitidine (ZANTAC) 150 MG tablet, Take 150 mg by mouth daily as needed for heartburn., Disp: , Rfl:  .  sacubitril-valsartan (ENTRESTO) 49-51 MG, Take 1 tablet by mouth 2 (two) times daily., Disp: 60 tablet, Rfl: 11 .  spironolactone (ALDACTONE) 25 MG tablet, Take 0.5 tablets (12.5 mg total) by mouth daily., Disp: 45 tablet, Rfl: 3 .  thiamine 100 MG tablet, Take 1 tablet (100 mg total) by mouth daily. (Patient not taking: Reported on 03/05/2017), Disp: 30 tablet, Rfl: 0 Allergies  Allergen Reactions  . Warfarin Other (See Comments)    Non compliance and ETOH abuse     Social History   Social History  . Marital status: Divorced    Spouse name: N/A  . Number of children: N/A  . Years of education: N/A  Occupational History  . Retired Therapist, music    Social History Main Topics  . Smoking status: Current Every Day Smoker    Packs/day: 1.00    Years: 42.00    Types: Cigarettes  . Smokeless tobacco: Never Used  . Alcohol use 0.0 oz/week     Comment: 11/22/2015 "6 pack of beer/month"  . Drug use: No  . Sexual activity: Not Currently   Other Topics Concern  . Not on file   Social History Narrative   ** Merged History Encounter **        Physical Exam  Constitutional: He is oriented to person, place, and time.  Cardiovascular: Normal rate and regular rhythm.   Pulmonary/Chest: Effort  normal. No respiratory distress. He has no wheezes. He has rhonchi in the right lower field and the left lower field. He has no rales.  Abdominal: Soft.  Musculoskeletal: Normal range of motion. He exhibits edema.  Neurological: He is alert and oriented to person, place, and time.  Skin: Skin is warm and dry.  Psychiatric: He has a normal mood and affect.        SAFE - 02/17/17 1500      Situation   Admitting diagnosis CHF   Heart failure history Exisiting   Comorbidities Atrial fibillation;COPD;CVA;DM;HTN;Hx DVT/PE;Hx MI/CAD   Readmitted within 30 days No   Hospital admission within past 12 months Yes   number of hospital admissions 1     Assessment   Lives alone No   Primary support person Daughter   Mode of transportation personal car   Other services involved None   Home equipement Home O2;Scale     Weight   Weighs self daily No   Scale provided No   Records on weight chart No     Resources   Has "Living better w/heart failure" book No   Has HF Zone tool No   Able to identify yellow zone signs/when to call MD No   Records zone daily No     Medications   Uses a pill box Yes   Who stocks the pill box Daughter   Pill box checked this visit Yes   Pill box refilled this visit No   Difficulty obtaining medications No   Mail order medications No   Missed one or more doses of medications per week No     Nutrition   Patient receives meals on wheels No   Patient follows low sodium diet No   Has foods at home that meet the current recommended diet Yes   Patient follows low sugar/card diet No   Nutritional concerns/issues no     Activity Level   ADL's/Mobility Dependent   How many feet can patient ambulate 60 feet   Typical activity level sedentary     Urine   Difficulty urinating No   Changes in urine None     Time spent with patient   Time spent with patient  50 Minutes        Future Appointments Date Time Provider Department Center  04/14/2017 9:30 AM  [facility] PA/NP [facility] None  04/14/2017 3:00 PM [facility]  04/14/2017 3:45 PM Nickel, Carma Lair, NP [facility]  04/21/2017 10:15 AM [facility] DEVICE REMOTES [facility]   BP 118/64 (BP Location: Right Arm, Patient Position: Sitting, Cuff Size: Normal)   Pulse 60   Resp 16   Wt 175 lb (79.4 kg)   SpO2 (!) 87%   BMI 27.41 kg/m  Weight yesterday- 175 lb  Last visit weight- 170 lb   Mr Hula was seen at home today and reports feeling well. He denied SOB, dizziness or headache. He is still smoking, drinking soft drinks and not following a low sodium diet. His weight was up 5 pounds since last week with decreased SPO2 and increased lower extremity edema. His normal SPO2 is around 90% per family so a reading of 87% was not overly alarming. Dr Shirlee Latch advised to increase furosemide to 80 mg BID for 3 days and then begin a new regimen of 60 mg BID. Prior to this, his daughter has not given me access to his pillbox and she stated she fills it for him. While making the changes today, I saw a few mistakes which I brought to their attention and fixed.   Time spent with patient: 43 minutes  Jacqualine Code, EMT 03/11/17  ACTION: Home visit completed Next visit planned for 1 week

## 2017-03-30 ENCOUNTER — Telehealth: Payer: Self-pay | Admitting: Internal Medicine

## 2017-03-30 ENCOUNTER — Other Ambulatory Visit (HOSPITAL_COMMUNITY): Payer: Self-pay

## 2017-03-30 NOTE — Telephone Encounter (Signed)
I called patient to discuss his symptoms but had to leave message asking for him to call us back.

## 2017-03-30 NOTE — Telephone Encounter (Signed)
Patient is seen in the Heart Failure Clinic, will forward to their nurses.

## 2017-03-30 NOTE — Telephone Encounter (Signed)
New message     Pt c/o swelling: STAT is pt has developed SOB within 24 hours  1) How much weight have you gained and in what time span? Doesn't know   2) If swelling, where is the swelling located? Hands and belly and legs   3) Are you currently taking a fluid pill? yes  4) Are you currently SOB? Yes with exertion  5) Do you have a log of your daily weights (if so, list)? no  6) Have you gained 3 pounds in a day or 5 pounds in a week? no  7) Have you traveled recently? no

## 2017-03-30 NOTE — Progress Notes (Signed)
Paramedicine Encounter    Patient ID: [name], male    DOB: Nov 05, 1945, 71 y.o.   MRN: [id number]   Patient Care Team: Henrine Screws, MD as PCP - General (Family Medicine) [name], MD as Consulting Physician (Cardiology) [name], MD as Consulting Physician (Cardiology)  Patient Active Problem List   Diagnosis Date Noted  . Acute respiratory failure with hypoxia and hypercapnia (HCC)   . COPD GOLD 0 still smoking  03/02/2016  . Chronic kidney disease (CKD), stage II (mild) 11/23/2015  . CAD (coronary artery disease)   . Dementia   . Chronic respiratory failure with hypoxia (HCC) 10/11/2015  . Chronic systolic CHF (congestive heart failure) (HCC) 05/30/2015  . Cigarette smoker 12/24/2014  . Stroke (HCC)   . Mitral valve disease   . Depression   . Mixed Ischemic and Nonischemic Cardiomyopathy   . Alcohol abuse   . Chronic anticoagulation 08/22/2014  . PVD (peripheral vascular disease) (HCC) 02/28/2014  . Mural thrombus of heart 02/07/2014  . Hypertension 06/14/2012  . Automatic implantable cardioverter-defibrillator in situ 04/04/2012  . Hyperlipidemia 10/27/2011  . Ventricular tachycardia (HCC) 07/14/2011  . Atrial fibrillation, chronic (HCC) 07/13/2011  . Right atrial thrombus (HCC) 07/11/2011    Current Outpatient Prescriptions:  .  albuterol (PROAIR HFA) 108 (90 Base) MCG/ACT inhaler, 2 puffs every 4 hours as needed only  if your can't catch your breath, Disp: 1 Inhaler, Rfl: 1 .  atorvastatin (LIPITOR) 40 MG tablet, Take 1 tablet (40 mg total) by mouth daily., Disp: 30 tablet, Rfl: 0 .  carvedilol (COREG) 6.25 MG tablet, Take 1 tablet (6.25 mg total) by mouth 2 (two) times daily., Disp: 60 tablet, Rfl: 11 .  ELIQUIS 5 MG TABS tablet, TAKE 1 TABLET (5 MG TOTAL) BY MOUTH 2 (TWO) TIMES DAILY., Disp: 180 tablet, Rfl: 0 .  fenofibrate (TRICOR) 48 MG tablet, TAKE 1 TABLET BY MOUTH EVERY DAY, Disp: 90 tablet, Rfl: 1 .  furosemide (LASIX) 40 MG tablet,  Take 1 tablet (40 mg total) by mouth 2 (two) times daily., Disp: 60 tablet, Rfl: 3 .  glipiZIDE (GLUCOTROL XL) 2.5 MG 24 hr tablet, Take 2.5 mg by mouth daily with breakfast., Disp: , Rfl:  .  Multiple Vitamin (MULTIVITAMIN WITH MINERALS) TABS tablet, Take 1 tablet by mouth daily., Disp: 30 tablet, Rfl: 2 .  nitroGLYCERIN (NITROSTAT) 0.4 MG SL tablet, Place 1 tablet (0.4 mg total) under the tongue every 5 (five) minutes as needed for chest pain., Disp: 25 tablet, Rfl: 2 .  ranitidine (ZANTAC) 150 MG tablet, Take 150 mg by mouth daily as needed for heartburn., Disp: , Rfl:  .  sacubitril-valsartan (ENTRESTO) 49-51 MG, Take 1 tablet by mouth 2 (two) times daily., Disp: 60 tablet, Rfl: 11 .  spironolactone (ALDACTONE) 25 MG tablet, Take 0.5 tablets (12.5 mg total) by mouth daily., Disp: 45 tablet, Rfl: 3 .  apixaban (ELIQUIS) 5 MG TABS tablet, Take 1 tablet (5 mg total) by mouth 2 (two) times daily., Disp: 180 tablet, Rfl: 3 .  thiamine 100 MG tablet, Take 1 tablet (100 mg total) by mouth daily. (Patient not taking: Reported on 03/05/2017), Disp: 30 tablet, Rfl: 0 Allergies  Allergen Reactions  . Warfarin Other (See Comments)    Non compliance and ETOH abuse     Social History   Social History  . Marital status: Divorced    Spouse name: N/A  . Number of children: N/A  . Years of education: N/A  Occupational History  . Retired Therapist, [employer]    Social History Main Topics  . Smoking status: Current Every Day Smoker    Packs/day: 1.00    Years: 42.00    Types: Cigarettes  . Smokeless tobacco: Never Used  . Alcohol use 0.0 oz/week     Comment: 11/22/2015 "6 pack of beer/month"  . Drug use: No  . Sexual activity: Not Currently   Other Topics Concern  . Not on file   Social History Narrative   ** Merged History Encounter **        Physical Exam  Constitutional: He is oriented to person, place, and time.  Cardiovascular: Normal rate and regular rhythm.   Pulmonary/Chest: Effort  normal and breath sounds normal. No respiratory distress.  Abdominal: Soft.  Musculoskeletal: Normal range of motion. He exhibits no edema.  Neurological: He is alert and oriented to person, place, and time.  Skin: Skin is warm and dry.  Psychiatric: He has a normal mood and affect.        SAFE - 02/17/17 1500      Situation   Admitting diagnosis CHF   Heart failure history Exisiting   Comorbidities Atrial fibillation;COPD;CVA;DM;HTN;Hx DVT/PE;Hx MI/CAD   Readmitted within 30 days No   Hospital admission within past 12 months Yes   number of hospital admissions 1     Assessment   Lives alone No   Primary support person Daughter   Mode of transportation personal car   Other services involved None   Home equipement Home O2;Scale     Weight   Weighs self daily No   Scale provided No   Records on weight chart No     Resources   Has "Living better w/heart failure" book No   Has HF Zone tool No   Able to identify yellow zone signs/when to call MD No   Records zone daily No     Medications   Uses a pill box Yes   Who stocks the pill box Daughter   Pill box checked this visit Yes   Pill box refilled this visit No   Difficulty obtaining medications No   Mail order medications No   Missed one or more doses of medications per week No     Nutrition   Patient receives meals on wheels No   Patient follows low sodium diet No   Has foods at home that meet the current recommended diet Yes   Patient follows low sugar/card diet No   Nutritional concerns/issues no     Activity Level   ADL's/Mobility Dependent   How many feet can patient ambulate 60 feet   Typical activity level sedentary     Urine   Difficulty urinating No   Changes in urine None     Time spent with patient   Time spent with patient  50 Minutes        Future Appointments Date Time Provider Department Center  04/14/2017 9:30 AM [facility] PA/NP [facility] None  04/14/2017 3:00 PM [facility]  04/14/2017 3:45 PM Nickel, Carma LairSuzanne L, NP [facility]  04/21/2017 10:15 AM [facility]OT[facility]  05/26/2017 10:40 AM Marily LenteSeiler, Amber K, NP [facility]   BP 116/70 (BP Location: Left Arm, Patient Position: Sitting, Cuff Size: Normal)   Pulse 88   Resp 16   Wt 175 lb (79.4 kg)   SpO2 90%   BMI 27.41 kg/m  Weight yesterday- Did not weight Last visit  weight- 175 lb  Mr Roam was seen at home today. His daughter reached out today because his hands were swollen and he had an episode of chest pain over the weekend. The patient reportedly refused to let his daughter call 911 for this chest pain so she gave him his evening medications and he stopped complaining of the pain. He denied having any pain since this occurred on Sunday. Last visit his lasix was increased to 60 mg BID per Dr Shirlee Latch. I was not able to reach the daughter to schedule a visit last week for follow up and as a result she had decreased his lasix back to 40 mg BID without consulting the clinic or myself. I explained that the changes I relayed were specifically from the physician at the clinic and his new regiment was 60 mg BID. She understood and made the changes in his pillbox The patient's daughter expressed concern that the patient is "taking so much medicine" when he had only been on 5 prior to being a patient at the clinic. She also voiced concern about having the pills cut or split because "they aren't supposed to be split in two." I have told her multiple times that it is OK to cut the medications of which he takes a half a tablet. She also said he is smoking a lot more lately. She also requested an updated list of his medications be sent to [facility] on [address]. Before this is sent, furosemide needs to be changed from 40 mg BID to 60 mg BID. I will contact Cicero Duck regarding this.  Time spent with patient: 53 minutes   Jacqualine Code, EMT 03/30/17  ACTION: Home visit  completed Next visit planned for 1 week

## 2017-03-31 ENCOUNTER — Other Ambulatory Visit (HOSPITAL_COMMUNITY): Payer: Self-pay | Admitting: Pharmacist

## 2017-03-31 MED ORDER — FUROSEMIDE 40 MG PO TABS: 60.0000 mg | ORAL_TABLET | Freq: Two times a day (BID) | ORAL | 3 refills | Status: DC

## 2017-04-05 ENCOUNTER — Other Ambulatory Visit: Payer: Self-pay | Admitting: Internal Medicine

## 2017-04-05 DIAGNOSIS — I5022 Chronic systolic (congestive) heart failure: Secondary | ICD-10-CM

## 2017-04-07 ENCOUNTER — Other Ambulatory Visit: Payer: Self-pay | Admitting: Cardiology

## 2017-04-07 ENCOUNTER — Other Ambulatory Visit: Payer: Self-pay

## 2017-04-07 ENCOUNTER — Ambulatory Visit (HOSPITAL_COMMUNITY): Payer: Medicare Other | Attending: Cardiovascular Disease

## 2017-04-07 DIAGNOSIS — I4891 Unspecified atrial fibrillation: Secondary | ICD-10-CM | POA: Diagnosis not present

## 2017-04-07 DIAGNOSIS — I5022 Chronic systolic (congestive) heart failure: Secondary | ICD-10-CM | POA: Insufficient documentation

## 2017-04-07 DIAGNOSIS — I081 Rheumatic disorders of both mitral and tricuspid valves: Secondary | ICD-10-CM | POA: Insufficient documentation

## 2017-04-07 DIAGNOSIS — I509 Heart failure, unspecified: Secondary | ICD-10-CM | POA: Insufficient documentation

## 2017-04-07 DIAGNOSIS — I313 Pericardial effusion (noninflammatory): Secondary | ICD-10-CM | POA: Diagnosis not present

## 2017-04-07 DIAGNOSIS — J449 Chronic obstructive pulmonary disease, unspecified: Secondary | ICD-10-CM | POA: Diagnosis not present

## 2017-04-07 HISTORY — PX: ECHO DOPPLER COMPLETE(TRANSTHOR) (ARMC HX): HXRAD1288

## 2017-04-07 MED ORDER — PERFLUTREN LIPID MICROSPHERE
1.0000 mL | INTRAVENOUS | Status: AC | PRN
  Administered 2017-04-07: 2 mL via INTRAVENOUS

## 2017-04-14 ENCOUNTER — Ambulatory Visit (HOSPITAL_COMMUNITY)
Admission: RE | Admit: 2017-04-14 | Discharge: 2017-04-14 | Disposition: A | Discharge status: Home / Self Care | Payer: Medicare Other | Source: Ambulatory Visit | Attending: Internal Medicine | Admitting: Internal Medicine

## 2017-04-14 ENCOUNTER — Ambulatory Visit: Payer: Medicare Other | Admitting: Family

## 2017-04-14 ENCOUNTER — Encounter: Payer: Self-pay | Admitting: Physician Assistant

## 2017-04-14 ENCOUNTER — Encounter (HOSPITAL_COMMUNITY): Payer: Medicare Other

## 2017-04-14 VITALS — BP 128/72 | HR 79 | Wt 174.2 lb

## 2017-04-14 DIAGNOSIS — F1010 Alcohol abuse, uncomplicated: Secondary | ICD-10-CM | POA: Diagnosis not present

## 2017-04-14 DIAGNOSIS — I482 Chronic atrial fibrillation, unspecified: Secondary | ICD-10-CM

## 2017-04-14 DIAGNOSIS — I2510 Atherosclerotic heart disease of native coronary artery without angina pectoris: Secondary | ICD-10-CM

## 2017-04-14 DIAGNOSIS — I5022 Chronic systolic (congestive) heart failure: Secondary | ICD-10-CM | POA: Insufficient documentation

## 2017-04-14 DIAGNOSIS — I10 Essential (primary) hypertension: Secondary | ICD-10-CM

## 2017-04-14 LAB — BASIC METABOLIC PANEL
ANION GAP: 6 (ref 5–15)
BUN: 30 mg/dL — ABNORMAL HIGH (ref 6–20)
CHLORIDE: 100 mmol/L — AB (ref 101–111)
CO2: 34 mmol/L — ABNORMAL HIGH (ref 22–32)
Calcium: 9.5 mg/dL (ref 8.9–10.3)
Creatinine, Ser: 1.46 mg/dL — ABNORMAL HIGH (ref 0.61–1.24)
GFR, EST AFRICAN AMERICAN: 55 mL/min — AB (ref >60)
GFR, EST NON AFRICAN AMERICAN: 47 mL/min — AB (ref >60)
Glucose, Bld: 306 mg/dL — ABNORMAL HIGH (ref 65–99)
POTASSIUM: 4.8 mmol/L (ref 3.5–5.1)
SODIUM: 140 mmol/L (ref 135–145)

## 2017-04-14 MED ORDER — CARVEDILOL 6.25 MG PO TABS: ORAL_TABLET | ORAL | 11 refills | Status: DC
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
## 2017-04-14 NOTE — Progress Notes (Signed)
Advanced Heart Failure Medication Review by a Pharmacist   Does the patient  feel that his/her medications are working for him/her?  yes  Has the patient been experiencing any side effects to the medications prescribed?  no  Does the patient measure his/her own blood pressure or blood glucose at home?  no   Does the patient have any problems obtaining medications due to transportation or finances?   no  Understanding of regimen: good Understanding of indications: good Potential of compliance: good Patient understands to avoid NSAIDs. Patient understands to avoid decongestants.  Issues to address at subsequent visits: None   Pharmacist comments: Mr. Budnick is a pleasant 70 yo M presenting with his daughter who manages his medications. He reports good compliance with his regimen and did not have any specific medication-related questions or concerns for me at this time.   Tyler Deis. Bonnye Fava, PharmD, BCPS, CPP Clinical Pharmacist Pager: [phone] Phone: [phone] 04/14/2017 9:44 AM      Time with patient: 10 minutes Preparation and documentation time: 2 minutes Total time: 12 minutes

## 2017-04-14 NOTE — Patient Instructions (Signed)
Routine lab work today. Will notify you of abnormal results, otherwise no news is good news!  INCREASE Carvedilol (Coreg) to 6.25 mg (1 tab) in am and 9.375 mg (1.5 tabs) in pm.  Follow up 3 months with Amy Clegg NP-C.  Take all medication as prescribed the day of your appointment. Bring all medications with you to your appointment.  Do the following things EVERYDAY: 1) Weigh yourself in the morning before breakfast. Write it down and keep it in a log. 2) Take your medicines as prescribed 3) Eat low salt foods-Limit salt (sodium) to 2000 mg per day.  4) Stay as active as you can everyday 5) Limit all fluids for the day to less than 2 liters

## 2017-04-14 NOTE — Addendum Note (Signed)
Encounter addended by: Marcy Siren, LCSW on: 04/14/2017 1:38 PM  Actions taken: Sign clinical note

## 2017-04-14 NOTE — Progress Notes (Signed)
Paramedicine CSW Initial Assessment  [name] is enrolled in the [employer] Par[facility]icine Program through [facility] Advanced [facility] Failure Clinic.  The patient presents today in association with an Advanced Heart Failure Clinic Appointment.  Patient seen today by CSW for follow up/assistance on Other: meet n greet to paramedicine program.    Social Determinants impacting successful heart failure regimen:  Housing: patient lives with daughter and family. Food: Do you have enough food? Yes  Do you know and understand healthy eating and how that affects your heart failure diagnosis? Yes  Do you follow a low salt diet?  Yes  Utilities: Do you have gas and/or electricity on in your home? Yes  Income: What is your source of income? Social Security Insurance: Research scientist (life sciences): Do you have transportation to your medical appointments?Yes  If yes, how? Daughter or other family members.    Daily Health Needs: Do you have a scale and weigh each day?  Yes  Are you able to adhere to your medication regimen? Yes  Do you ever take medications differently than prescribed? No  Do you know the zones of Heart Failure?  Yes  Do you know how to contact the HF Clinic appropriately with worsening symptoms or weight increases?  Yes   Do you have an Advanced Directive?  Yes  If not, would you like to complete?  Do you have any identified obstacles / challenges for adherence to current treatment plan?No    Patient appears to have good supports from her daughter and family. Patient appears to be in good spirits and denies any concerns at this time. Both patient and daughter are grateful for support of the [employer].   CSW assisted patient with Supportive Counseling with the goal to Increase healthy adjustment to current life circumstances  Community Paramedicine Program and Heart Failure Clinic will continue to coordinate and monitor patient's treatment plan. CSW  continues to be available for identified needs. Lasandra Beech, LCSW, CCSW-MCS [phone]

## 2017-04-21 ENCOUNTER — Ambulatory Visit (INDEPENDENT_AMBULATORY_CARE_PROVIDER_SITE_OTHER): Payer: Medicare Other | Admitting: *Deleted

## 2017-04-21 DIAGNOSIS — I5022 Chronic systolic (congestive) heart failure: Secondary | ICD-10-CM

## 2017-04-21 DIAGNOSIS — I255 Ischemic cardiomyopathy: Secondary | ICD-10-CM

## 2017-04-21 NOTE — Progress Notes (Signed)
Remote ICD transmission.   

## 2017-04-22 LAB — CUP PACEART REMOTE DEVICE CHECK
Battery Voltage: 2.95 V
Brady Statistic AP VP Percent: 0 %
Brady Statistic AP VS Percent: 0 %
Brady Statistic AS VP Percent: 95.02 %
Brady Statistic AS VS Percent: 4.98 %
Brady Statistic RA Percent Paced: 0 %
Brady Statistic RV Percent Paced: 96.36 %
Date Time Interrogation Session: 20181114143426
HIGH POWER IMPEDANCE MEASURED VALUE: 44 Ohm
HighPow Impedance: 55 Ohm
Implantable Lead Implant Date: 20090617
Implantable Lead Location: 753858
Implantable Lead Location: 753859
Implantable Lead Model: 5076
Implantable Pulse Generator Implant Date: 20150827
Lead Channel Impedance Value: 4047 Ohm
Lead Channel Impedance Value: 4047 Ohm
Lead Channel Impedance Value: 437 Ohm
Lead Channel Impedance Value: 532 Ohm
Lead Channel Impedance Value: 589 Ohm
Lead Channel Pacing Threshold Amplitude: 0.625 V
Lead Channel Pacing Threshold Amplitude: 1.25 V
Lead Channel Pacing Threshold Pulse Width: 0.4 ms
Lead Channel Sensing Intrinsic Amplitude: 24.875 mV
Lead Channel Setting Pacing Amplitude: 2.5 V
Lead Channel Setting Pacing Amplitude: 2.5 V
Lead Channel Setting Pacing Pulse Width: 0.4 ms
MDC IDC LEAD IMPLANT DT: 20060307
MDC IDC LEAD IMPLANT DT: 20080110
MDC IDC LEAD LOCATION: 753860
MDC IDC MSMT BATTERY REMAINING LONGEVITY: 34 mo
MDC IDC MSMT LEADCHNL LV PACING THRESHOLD PULSEWIDTH: 0.4 ms
MDC IDC MSMT LEADCHNL RA IMPEDANCE VALUE: 570 Ohm
MDC IDC MSMT LEADCHNL RA PACING THRESHOLD PULSEWIDTH: 0.4 ms
MDC IDC MSMT LEADCHNL RA SENSING INTR AMPL: 0.875 mV
MDC IDC MSMT LEADCHNL RA SENSING INTR AMPL: 0.875 mV
MDC IDC MSMT LEADCHNL RV PACING THRESHOLD AMPLITUDE: 0.625 V
MDC IDC MSMT LEADCHNL RV SENSING INTR AMPL: 24.875 mV
MDC IDC SET LEADCHNL RV PACING PULSEWIDTH: 0.4 ms
MDC IDC SET LEADCHNL RV SENSING SENSITIVITY: 0.45 mV

## 2017-04-23 ENCOUNTER — Encounter: Payer: Self-pay | Admitting: Cardiology

## 2017-04-23 ENCOUNTER — Telehealth (HOSPITAL_COMMUNITY): Payer: Self-pay

## 2017-04-23 NOTE — Telephone Encounter (Signed)
I called [name] to schedule an appointment today. There was no answer so I left a voicemail stating my information, reason for calling and requested a call back.

## 2017-05-04 ENCOUNTER — Telehealth (HOSPITAL_COMMUNITY): Payer: Self-pay | Admitting: Surgery

## 2017-05-04 NOTE — Telephone Encounter (Signed)
Patient has been discharged from the HF Community Paramedicine Program due to inability to make contact and schedule home visits.  He will continue to to be seen when appropriate by the HF clinic and can call anytime with concerns or questions. 

## 2017-05-20 ENCOUNTER — Ambulatory Visit (HOSPITAL_COMMUNITY)
Admission: RE | Admit: 2017-05-20 | Discharge: 2017-05-20 | Disposition: A | Discharge status: Home / Self Care | Payer: Medicare Other | Source: Ambulatory Visit | Attending: Family | Admitting: Family

## 2017-05-20 ENCOUNTER — Encounter: Payer: Self-pay | Admitting: Family

## 2017-05-20 ENCOUNTER — Ambulatory Visit (INDEPENDENT_AMBULATORY_CARE_PROVIDER_SITE_OTHER): Payer: Medicare Other | Admitting: Family

## 2017-05-20 VITALS — BP 133/73 | HR 75 | Temp 97.0°F | Resp 18 | Wt 179.3 lb

## 2017-05-20 DIAGNOSIS — I70203 Unspecified atherosclerosis of native arteries of extremities, bilateral legs: Secondary | ICD-10-CM | POA: Diagnosis not present

## 2017-05-20 DIAGNOSIS — E1151 Type 2 diabetes mellitus with diabetic peripheral angiopathy without gangrene: Secondary | ICD-10-CM | POA: Diagnosis not present

## 2017-05-20 DIAGNOSIS — F17200 Nicotine dependence, unspecified, uncomplicated: Secondary | ICD-10-CM

## 2017-05-20 DIAGNOSIS — I70209 Unspecified atherosclerosis of native arteries of extremities, unspecified extremity: Secondary | ICD-10-CM

## 2017-05-20 NOTE — Progress Notes (Signed)
[facility] [place]   CC: Follow up peripheral artery occlusive disease  History of Present Illness [name] is a 71 y.o. male whom Dr. Edilia Bo has been monitoring for infrainguinal arterial occlusive disease bilaterally. He returns for follow up.  He denies any history of claudication or rest pain. However, his daughter states he walks very little. He denies non healing wounds in his feet/legs.   He has a history of significant congestive heart failure with an EF of 20% and was told not to perform any strenuous activity. Unfortunately he smokes. He used to work for a tobacco company.  He and his daughter report a history of a stoke in 29 and another about 2009. His daughter states that his residual neurological deficits are some cognitive issues.   I referred pt to Dr. Leslie Dales, endocrinologist, at his visit on 10-07-16,  for management of his DM, at daughter's request. But daughter states he has been "started on a sugar pill" since then, and a team approach to manage his DM by his PCP.    Pt Diabetic: Yes, last A1C result on file was 7.8 on 02-12-17, daughter states he has been started on "sugar pill" since then.   Pt smoker: smoker  (1-2 ppd, started at age 12 yrs)  Pt meds include: Statin :Yes Betablocker: Yes ASA: No Other anticoagulants/antiplatelets: He takes Eliquis instead of coumadin, took coumadin since he was 71 years old when he had his first CABG, also had a CABG in 2008. He has a history of atrial fib   Past Medical History:  Diagnosis Date  . AICD (automatic cardioverter/defibrillator) present   . Alcohol abuse   . Anxiety   . Bipolar disorder (HCC)   . Biventricular automatic implantable cardioverter defibrillator in situ    a. 01/2014 s/p MDT [id number] Viva XT CRT-D (ser # [id number]).  . CAD (coronary artery disease)    a. s/p prior PCI/stenting of the LCX;  b. 08/2014 MV: EF 20%, large septal, apical, and inferior infarct from apex  to base, no ischemia;  c. 08/2014 NSTEMI/Cath: LM mod distal dzs extending into ostial LCX (80%), LAD tortuous, RI nl, OM mod dzs, RCA 100 CTO with R->R and L->R collats-->Med Rx.  . Chronic atrial fibrillation (HCC)    a. CHA2DS2VASc = 6->eliquis;  b. S/P AVN RFCA an BiV ICD placement.  . Chronic respiratory failure (HCC)   . Chronic systolic congestive heart failure (HCC)    a. 01/2014 Echo: EF 25-30%, mod conc LVH, mod dil LA.  . CKD (chronic kidney disease)    a. suspect stage II-III based on historical labs.  . Complete heart block (HCC)    a. In setting of prior AV nodal ablation r/t afib-->BiV ICD (01/2014).  . COPD (chronic obstructive pulmonary disease) (HCC)   . CVA (cerebral vascular accident) (HCC)    a. Multiple prior embolic strokes.  . Dementia   . Depression   . DVT (deep venous thrombosis) (HCC)   . Dyslipidemia   . Hyperglycemia   . Hypertension   . Mitral valve disease    a. remote mitral replacement with Bjork Shiley valve;  b. 06/2006 Redo MVR with tissue valve.  . Mixed Ischemic and Nonischemic Cardiomyopathy    a. 8/.2015 Echo: Ef 25-30%;  b. 01/2014 s/p MDT [id number] Talbot Grumbling XT CRT-D (ser # [id number]).  . On home oxygen therapy    "3L at night" (11/23/2015)  . Ventral hernia     Social History Social  History   Tobacco Use  . Smoking status: Current Every Day Smoker    Packs/day: 2.00    Years: 42.00    Pack years: 84.00    Types: Cigarettes  . Smokeless tobacco: Never Used  Substance Use Topics  . Alcohol use: Yes    Alcohol/week: 0.0 oz    Comment: 11/22/2015 "6 pack of beer/month"  . Drug use: No    Family History Family History  Problem Relation Age of Onset  . Alzheimer's disease Mother   . Heart attack Father   . Heart disease Father   . Heart attack Son   . Varicose Veins Son   . Deep vein thrombosis Son   . Heart disease Son   . Stroke Unknown   . Heart disease Unknown   . Heart disease Brother     Past Surgical History:  Procedure  Laterality Date  . AV NODE ABLATION  2007  . CARDIAC CATHETERIZATION  06/2006   Mild ostial L main stenosis, CFX stent patent, RCA occluded (old)  . CORONARY ARTERY BYPASS GRAFT  2008  . CYSTOSCOPY W/ URETERAL STENT PLACEMENT  07/12/2011   Procedure: CYSTOSCOPY WITH RETROGRADE PYELOGRAM/URETERAL STENT PLACEMENT;  Surgeon: Sebastian Acheheodore Manny, MD;  [facility];  Service: Urology;  Laterality: Left;  . HERNIA REPAIR    . ICD GENERATOR CHANGE  02/01/2014   Gen change to: Medtronic VIVA pulse generator, serial number [id number]  . INSERT / REPLACE / REMOVE PACEMAKER    . LEFT HEART CATHETERIZATION WITH CORONARY ANGIOGRAM N/A 08/23/2014   Procedure: LEFT HEART CATHETERIZATION WITH CORONARY ANGIOGRAM;  Surgeon: Corky CraftsJayadeep S Varanasi, MD;  [facility];  Service: Cardiovascular;  Laterality: N/A;  . MITRAL VALVE REPLACEMENT     remote [facility] valve [id number] with redo tissue valve 06/2006  . PACEMAKER GENERATOR CHANGE N/A 02/01/2014   Procedure: PACEMAKER GENERATOR CHANGE;  Surgeon: Duke SalviaSteven C Klein, MD;  [facility];  Service: Cardiovascular;  Laterality: N/A;  . PACEMAKER PLACEMENT  2006   Changed to CRT-D in 2009    Allergies  Allergen Reactions  . Warfarin Other (See Comments)    Non compliance and ETOH abuse    Current Outpatient Medications  Medication Sig Dispense Refill  . albuterol (PROAIR HFA) 108 (90 Base) MCG/ACT inhaler 2 puffs every 4 hours as needed only  if your can't catch your breath 1 Inhaler 1  . apixaban (ELIQUIS) 5 MG TABS tablet Take 1 tablet (5 mg total) by mouth 2 (two) times daily. 180 tablet 3  . atorvastatin (LIPITOR) 40 MG tablet Take 1 tablet (40 mg total) by mouth daily. 30 tablet 0  . carvedilol (COREG) 6.25 MG tablet Take 6.25 mg (1 tab) in am and 9.375 mg (1.5 tabs) in pm 75 tablet 11  . fenofibrate (TRICOR) 48 MG tablet TAKE 1 TABLET BY MOUTH EVERY DAY 90 tablet 1  . furosemide (LASIX) 40 MG tablet Take 1.5 tablets (60 mg total) by mouth 2 (two)  times daily. 90 tablet 3  . glipiZIDE (GLUCOTROL) 5 MG tablet Take 5 mg daily before breakfast by mouth.    . Multiple Vitamin (MULTIVITAMIN WITH MINERALS) TABS tablet Take 1 tablet by mouth daily. 30 tablet 2  . nitroGLYCERIN (NITROSTAT) 0.4 MG SL tablet Place 1 tablet (0.4 mg total) under the tongue every 5 (five) minutes as needed for chest pain. 25 tablet 2  . ranitidine (ZANTAC) 150 MG tablet Take 150 mg by mouth daily as needed for heartburn.    .Marland Kitchen  sacubitril-valsartan (ENTRESTO) 49-51 MG Take 1 tablet by mouth 2 (two) times daily. 60 tablet 11  . spironolactone (ALDACTONE) 25 MG tablet Take 0.5 tablets (12.5 mg total) by mouth daily. 45 tablet 3  . thiamine 100 MG tablet Take 1 tablet (100 mg total) by mouth daily. 30 tablet 0   No current facility-administered medications for this visit.     ROS: See HPI for pertinent positives and negatives.   Physical Examination  Vitals:   05/20/17 0851 05/20/17 0853  BP: 137/77 133/73  Pulse: 75   Resp: 18   Temp: (!) 97 F (36.1 C)   TempSrc: Oral   SpO2: 94%   Weight: 179 lb 4.8 oz (81.3 kg)    Body mass index is 28.08 kg/m.   GENERAL : The patient is a well-nourished male, in no acute distress. The vital signs are documented above. EYES: PERRLA. CARDIAC:There is a regular rate and rhythm. + murmur. Pacemaker/defibrillator subcutaneous left upper chest. VASCULAR:Radial pulses are 2+ palpable bilaterally. Adominal aortic pulse is not palpable                        Bilateral femoral pulses are 1+palpable. Bilateral popliteal pulses are not palpable. Bilateral pedal pulses are not palpable. No signs of ischemia in his feet/legs. PULMONARY:There is fair air exchange bilaterally without wheezing or rales. He has an occasional moist cough. ABDOMEN:Soft and non-tender with normal pitched bowel sounds. Large asymptomatic reducible ventral/umbilical hernia.  MUSCULOSKELETAL:There are no major deformities or cyanosis.  Clubbing of  fingernails and toenails. Fingernails are tobacco stained. NEUROLOGIC:No focal weakness or paresthesias are detected. SENSATION: normal; MOTOR FUNCTION:  moving all extremities equally, motor strength 5/5 throughout. Speech is terse/normal. CN 2-12 intact except he has significant hearing loss.  SKIN:There are no ulcers or rashes noted, no cellulitis. PSYCHIATRIC:The patient has a normal affect.    ASSESSMENT: [name] is a 71 y.o. male who has known infrainguinal arterial occlusive disease bilaterally.  He has no signs of ischemia in his feet/legs. He does not seem to walk enough to elicit claudication, but he does walk around his house.  Dr. Edilia Bo indicates in his assessment dated 09/05/14 that pt would be at high-risk for intervention given his cardiac history.  His atherosclerotic risk factors include active smoking since age 81 years (currently 1-2 ppd), DM, CABG at age 39, CHF with 20% EF, and COPD. He also has CHF and dementia. He has had a stoke in 1998 and 2009, on exam there does not seem to be lateralized weakness, but daughter states he has some cognitive issues since his strokes and sleeps a great deal, states he chain smokes when he is awake.    DATA  ABI (Date: 05/20/2017):  R:   ABI: 0.64 (was 0.68 on 10-07-16),   PT: bi  DP: mono  TBI:  0.25 (was 0.48  L:   ABI: 0.82 (was 0.78),   PT: mono  DP: mono  TBI: 0.37 (was 0.29)  Stable bilateral ABI with bi and monophasic waveforms; moderate disease in the right, mild in the left lower extremity.   PLAN:  Daily seated leg exercises discussed with pt and daughter and demonstrated.    The patient was counseled re smoking cessation and given several free resources re smoking cessation.  Based on the patient's vascular studies and examination, pt will return to clinic in 1 year with ABI's. I advised him and his daughter to notify us if he develops  sores in his feet or legs that have trouble  healing, or pain in his legs with walking.   I discussed in depth with the patient the nature of atherosclerosis, and emphasized the importance of maximal medical management including strict control of blood pressure, blood glucose, and lipid levels, obtaining regular exercise, and cessation of smoking.  The patient is aware that without maximal medical management the underlying atherosclerotic disease process will progress, limiting the benefit of any interventions.  The patient was given information about PAD including signs, symptoms, treatment, what symptoms should prompt the patient to seek immediate medical care, and risk reduction measures to take.  Charisse March, RN, MSN, FNP-C [facility] [place] Phone: [phone]  Clinic MD: [facility]  05/20/17 9:02 AM

## 2017-05-20 NOTE — Patient Instructions (Signed)
Steps to Quit Smoking Smoking tobacco can be bad for your health. It can also affect almost every organ in your body. Smoking puts you and people around you at risk for many serious long-lasting (chronic) diseases. Quitting smoking is hard, but it is one of the best things that you can do for your health. It is never too late to quit. What are the benefits of quitting smoking? When you quit smoking, you lower your risk for getting serious diseases and conditions. They can include:  Lung cancer or lung disease.  Heart disease.  Stroke.  Heart attack.  Not being able to have children (infertility).  Weak bones (osteoporosis) and broken bones (fractures).  If you have coughing, wheezing, and shortness of breath, those symptoms may get better when you quit. You may also get sick less often. If you are pregnant, quitting smoking can help to lower your chances of having a baby of low birth weight. What can I do to help me quit smoking? Talk with your doctor about what can help you quit smoking. Some things you can do (strategies) include:  Quitting smoking totally, instead of slowly cutting back how much you smoke over a period of time.  Going to in-person counseling. You are more likely to quit if you go to many counseling sessions.  Using resources and support systems, such as: ? Online chats with a counselor. ? Phone quitlines. ? Printed self-help materials. ? Support groups or group counseling. ? Text messaging programs. ? Mobile phone apps or applications.  Taking medicines. Some of these medicines may have nicotine in them. If you are pregnant or breastfeeding, do not take any medicines to quit smoking unless your doctor says it is okay. Talk with your doctor about counseling or other things that can help you.  Talk with your doctor about using more than one strategy at the same time, such as taking medicines while you are also going to in-person counseling. This can help make  quitting easier. What things can I do to make it easier to quit? Quitting smoking might feel very hard at first, but there is a lot that you can do to make it easier. Take these steps:  Talk to your family and friends. Ask them to support and encourage you.  Call phone quitlines, reach out to support groups, or work with a counselor.  Ask people who smoke to not smoke around you.  Avoid places that make you want (trigger) to smoke, such as: ? Bars. ? Parties. ? Smoke-break areas at work.  Spend time with people who do not smoke.  Lower the stress in your life. Stress can make you want to smoke. Try these things to help your stress: ? Getting regular exercise. ? Deep-breathing exercises. ? Yoga. ? Meditating. ? Doing a body scan. To do this, close your eyes, focus on one area of your body at a time from head to toe, and notice which parts of your body are tense. Try to relax the muscles in those areas.  Download or buy apps on your mobile phone or tablet that can help you stick to your quit plan. There are many free apps, such as QuitGuide from the CDC (Centers for Disease Control and Prevention). You can find more support from smokefree.gov and other websites.  This information is not intended to replace advice given to you by your health care provider. Make sure you discuss any questions you have with your health care provider. Document Released: 03/21/2009 Document   Revised: 01/21/2016 Document Reviewed: 10/09/2014 Elsevier Interactive Patient Education  2018 Elsevier Inc.     Peripheral Vascular Disease Peripheral vascular disease (PVD) is a disease of the blood vessels that are not part of your heart and brain. A simple term for PVD is poor circulation. In most cases, PVD narrows the blood vessels that carry blood from your heart to the rest of your body. This can result in a decreased supply of blood to your arms, legs, and internal organs, like your stomach or kidneys.  However, it most often affects a person's lower legs and feet. There are two types of PVD.  Organic PVD. This is the more common type. It is caused by damage to the structure of blood vessels.  Functional PVD. This is caused by conditions that make blood vessels contract and tighten (spasm).  Without treatment, PVD tends to get worse over time. PVD can also lead to acute ischemic limb. This is when an arm or limb suddenly has trouble getting enough blood. This is a medical emergency. Follow these instructions at home:  Take medicines only as told by your doctor.  Do not use any tobacco products, including cigarettes, chewing tobacco, or electronic cigarettes. If you need help quitting, ask your doctor.  Lose weight if you are overweight, and maintain a healthy weight as told by your doctor.  Eat a diet that is low in fat and cholesterol. If you need help, ask your doctor.  Exercise regularly. Ask your doctor for some good activities for you.  Take good care of your feet. ? Wear comfortable shoes that fit well. ? Check your feet often for any cuts or sores. Contact a doctor if:  You have cramps in your legs while walking.  You have leg pain when you are at rest.  You have coldness in a leg or foot.  Your skin changes.  You are unable to get or have an erection (erectile dysfunction).  You have cuts or sores on your feet that are not healing. Get help right away if:  Your arm or leg turns cold and blue.  Your arms or legs become red, warm, swollen, painful, or numb.  You have chest pain or trouble breathing.  You suddenly have weakness in your face, arm, or leg.  You become very confused or you cannot speak.  You suddenly have a very bad headache.  You suddenly cannot see. This information is not intended to replace advice given to you by your health care provider. Make sure you discuss any questions you have with your health care provider. Document Released:  08/19/2009 Document Revised: 10/31/2015 Document Reviewed: 11/02/2013 Elsevier Interactive Patient Education  2017 Elsevier Inc.  

## 2017-05-21 NOTE — Addendum Note (Signed)
Addended by: Burton Apley A on: 05/21/2017 11:20 AM   Modules accepted: Orders

## 2017-05-24 NOTE — Progress Notes (Signed)
Electrophysiology Office Note Date: 05/26/2017  ID:  Edker, Bergsten 02-16-1946, MRN [id number]  PCP: Georgann Housekeeper, MD Primary Cardiologist: Shirlee Latch Electrophysiologist: Ladona Ridgel  CC: worsening shortness of breath  [name] is a 71 y.o. male seen today for Dr Ladona Ridgel.  He presents today for evaluation of worsening shortness of breath. He is seen today with his daughter and granddaughter. They report that he is not very active at home and smokes continuously. He also is now requiring oxygen around the clock, but they do not feel that pulmonary has been helpful with obtaining portable oxygen equipment.  His daughter states that he will occasionally not take his medication.  His daughter also states that "when Dr Ty Hilts was his doctor, he was only on 5 medicines and doing well. Now that he is with [facility], he is on 10 medicines and doing worse".  She states "I know that there are benefits to ya'll for putting him on these medicines".  She states that rationales for medications have been reviewed several times, she is concerned that he continues to decline. She states that "we always see the PA because the doctors want them to do everything for them".  It was a very difficult conversation in clinic today.  He denies chest pain, palpitations, nausea, vomiting, dizziness, syncope, or early satiety.  He has not had ICD shocks.   Device History: MDT CRTD implanted 2006 for NICM; gen change 2015; s/p AVN ablation History of appropriate therapy: No History of AAD therapy: No   Past Medical History:  Diagnosis Date  . AICD (automatic cardioverter/defibrillator) present   . Alcohol abuse   . Anxiety   . Bipolar disorder (HCC)   . Biventricular automatic implantable cardioverter defibrillator in situ    a. 01/2014 s/p MDT [id number] Viva XT CRT-D (ser # [id number]).  . CAD (coronary artery disease)    a. s/p prior PCI/stenting of the LCX;  b. 08/2014 MV: EF 20%, large septal, apical,  and inferior infarct from apex to base, no ischemia;  c. 08/2014 NSTEMI/Cath: LM mod distal dzs extending into ostial LCX (80%), LAD tortuous, RI nl, OM mod dzs, RCA 100 CTO with R->R and L->R collats-->Med Rx.  . Chronic atrial fibrillation (HCC)    a. CHA2DS2VASc = 6->eliquis;  b. S/P AVN RFCA an BiV ICD placement.  . Chronic respiratory failure (HCC)   . Chronic systolic congestive heart failure (HCC)    a. 01/2014 Echo: EF 25-30%, mod conc LVH, mod dil LA.  . CKD (chronic kidney disease)    a. suspect stage II-III based on historical labs.  . Complete heart block (HCC)    a. In setting of prior AV nodal ablation r/t afib-->BiV ICD (01/2014).  . COPD (chronic obstructive pulmonary disease) (HCC)   . CVA (cerebral vascular accident) (HCC)    a. Multiple prior embolic strokes.  . Dementia   . Depression   . DVT (deep venous thrombosis) (HCC)   . Dyslipidemia   . Hyperglycemia   . Hypertension   . Mitral valve disease    a. remote mitral replacement with Bjork Shiley valve;  b. 06/2006 Redo MVR with tissue valve.  . Mixed Ischemic and Nonischemic Cardiomyopathy    a. 8/.2015 Echo: Ef 25-30%;  b. 01/2014 s/p MDT [id number] Talbot Grumbling XT CRT-D (ser # [id number]).  . On home oxygen therapy    "3L at night" (11/23/2015)  . Ventral hernia    Past Surgical History:  Procedure Laterality Date  .  AV NODE ABLATION  2007  . CARDIAC CATHETERIZATION  06/2006   Mild ostial L main stenosis, CFX stent patent, RCA occluded (old)  . CORONARY ARTERY BYPASS GRAFT  2008  . CYSTOSCOPY W/ URETERAL STENT PLACEMENT  07/12/2011   Procedure: CYSTOSCOPY WITH RETROGRADE PYELOGRAM/URETERAL STENT PLACEMENT;  Surgeon: Sebastian Acheheodore Manny, MD;  [facility];  Service: Urology;  Laterality: Left;  . HERNIA REPAIR    . ICD GENERATOR CHANGE  02/01/2014   Gen change to: Medtronic VIVA pulse generator, serial number [id number]  . INSERT / REPLACE / REMOVE PACEMAKER    . LEFT HEART CATHETERIZATION WITH CORONARY ANGIOGRAM N/A 08/23/2014     Procedure: LEFT HEART CATHETERIZATION WITH CORONARY ANGIOGRAM;  Surgeon: Corky CraftsJayadeep S Varanasi, MD;  [facility];  Service: Cardiovascular;  Laterality: N/A;  . MITRAL VALVE REPLACEMENT     [facility] [id number] with redo tissue valve 06/2006  . PACEMAKER GENERATOR CHANGE N/A 02/01/2014   Procedure: PACEMAKER GENERATOR CHANGE;  Surgeon: Duke SalviaSteven C Klein, MD;  [facility];  Service: Cardiovascular;  Laterality: N/A;  . PACEMAKER PLACEMENT  2006   Changed to CRT-D in 2009    Current Outpatient Medications  Medication Sig Dispense Refill  . albuterol (PROAIR HFA) 108 (90 Base) MCG/ACT inhaler 2 puffs every 4 hours as needed only  if your can't catch your breath 1 Inhaler 1  . apixaban (ELIQUIS) 5 MG TABS tablet Take 1 tablet (5 mg total) by mouth 2 (two) times daily. 180 tablet 3  . atorvastatin (LIPITOR) 40 MG tablet Take 1 tablet (40 mg total) by mouth daily. 30 tablet 0  . carvedilol (COREG) 6.25 MG tablet Take 6.25 mg (1 tab) in am and 9.375 mg (1.5 tabs) in pm 75 tablet 11  . fenofibrate (TRICOR) 48 MG tablet TAKE 1 TABLET BY MOUTH EVERY DAY 90 tablet 1  . glipiZIDE (GLUCOTROL) 5 MG tablet Take 5 mg daily before breakfast by mouth.    . Multiple Vitamin (MULTIVITAMIN WITH MINERALS) TABS tablet Take 1 tablet by mouth daily. 30 tablet 2  . nitroGLYCERIN (NITROSTAT) 0.4 MG SL tablet Place 1 tablet (0.4 mg total) under the tongue every 5 (five) minutes as needed for chest pain. 25 tablet 2  . ranitidine (ZANTAC) 150 MG tablet Take 150 mg by mouth daily as needed for heartburn.    . sacubitril-valsartan (ENTRESTO) 49-51 MG Take 1 tablet by mouth 2 (two) times daily. 60 tablet 11  . spironolactone (ALDACTONE) 25 MG tablet Take 0.5 tablets (12.5 mg total) by mouth daily. 45 tablet 3  . thiamine 100 MG tablet Take 1 tablet (100 mg total) by mouth daily. 30 tablet 0  . torsemide (DEMADEX) 20 MG tablet Take 3 tablets (60 mg total) by mouth daily. 90 tablet 3   No current  facility-administered medications for this visit.     Allergies:   Warfarin   Social History: Social History   Socioeconomic History  . Marital status: Divorced    Spouse name: Not on file  . Number of children: Not on file  . Years of education: Not on file  . Highest education level: Not on file  Social Needs  . Financial resource strain: Not on file  . Food insecurity - worry: Not on file  . Food insecurity - inability: Not on file  . Transportation needs - medical: Not on file  . Transportation needs - non-medical: Not on file  Occupational History  . Occupation: Retired Therapist, [employer]  Tobacco Use  . Smoking status: Current Every Day Smoker    Packs/day: 2.00    Years: 42.00    Pack years: 84.00    Types: Cigarettes  . Smokeless tobacco: Never Used  Substance and Sexual Activity  . Alcohol use: Yes    Alcohol/week: 0.0 oz    Comment: 11/22/2015 "6 pack of beer/month"  . Drug use: No  . Sexual activity: Not Currently  Other Topics Concern  . Not on file  Social History Narrative   ** Merged History Encounter **        Family History: Family History  Problem Relation Age of Onset  . Alzheimer's disease Mother   . Heart attack Father   . Heart disease Father   . Heart attack Son   . Varicose Veins Son   . Deep vein thrombosis Son   . Heart disease Son   . Stroke Unknown   . Heart disease Unknown   . Heart disease Brother     Review of Systems: All other systems reviewed and are otherwise negative except as noted above.   Physical Exam: VS:  BP 137/66   Pulse 76   Ht 5\' 7"  (1.702 m)   Wt 185 lb 6.4 oz (84.1 kg)   SpO2 98%   BMI 29.04 kg/m  , BMI Body mass index is 29.04 kg/m.  GEN- The patient is elderly and chronically ill appearing, alert and oriented x 3 today.   HEENT: normocephalic, atraumatic; sclera clear, conjunctiva pink; hearing intact; oropharynx clear; neck supple, +JVP Lungs- Slightly increased work of breathing, bibasilar  crackles Heart- Regular rate and rhythm (paced) GI- distended, non-tender, non-distended, bowel sounds present  Extremities- no clubbing, cyanosis, +trace dependent edema MS- no significant deformity or atrophy Skin- warm and dry, no rash or lesion; ICD pocket well healed Psych- euthymic mood, full affect Neuro- strength and sensation are intact  ICD interrogation- reviewed in detail today,  See PACEART report  EKG:  EKG is not ordered today.  Recent Labs: 01/27/2017: Hemoglobin 12.0; Platelets 176 02/01/2017: B Natriuretic Peptide 102.6 05/25/2017: BUN 19; Creatinine, Ser 1.06; NT-Pro BNP 1,108; Potassium 5.1; Sodium 140   Wt Readings from Last 3 Encounters:  05/25/17 185 lb 6.4 oz (84.1 kg)  05/20/17 179 lb 4.8 oz (81.3 kg)  04/14/17 174 lb 3.2 oz (79 kg)     Other studies Reviewed: Additional studies/ records that were reviewed today include: Dr Shirlee Latch and Dr Lubertha Basque office notes   Assessment and Plan:  1.  Chronic systolic dysfunction Fluid overloaded on exam Discussed with Dr Shirlee Latch today - will change Lasix to Torsemide and follow BMET closely BMET, BNP today Follow up with AHF clinic in next 2 weeks.  Normal ICD function See Arita Miss Art report No changes today   2.  Permanent atrial fibrillation S/p AVN ablation Continue Eliquis for CHADS2VASC of 6 No bleeding issues  3.  HTN Stable No change required today  4.  Shortness of breath Likely multifactorial He clearly has fluid overload on exam - will adjust diuretics as above He also has COPD with ongoing tobacco abuse which is complicating the situation. His daughter would like for pulmonary to assist with obtaining portable O2 equipment. I have advised her I would send a copy of my note over to them today.  5.  Tobacco abuse Cessation advised   The patient's daughter seems very dissatisfied with the care that has been provided.  I explained the rationale for medications again as well  as reiterated the fact  that the AHF clinic was the specialist clinic for HF in the region.  We discussed that the patient had to partner with [place] to optimize his care and that tobacco cessation, salt avoidance, daily activity would be essential to improving quality of life.  We also discussed that HF is a progressive and ultimately terminal condition and that things change over the course of years.  I have encouraged her to consider if she would feel more comfortable obtaining a second opinion from one of the tertiary care centers around [place].    Current medicines are reviewed at length with the patient today.   The patient does not have concerns regarding his medicines.  The following changes were made today:  Stop Lasix, start Torsemide 60mg  daily  Labs/ tests ordered today include: none Orders Placed This Encounter  Procedures  . Basic metabolic panel  . Pro b natriuretic peptide (BNP)  . Basic metabolic panel     Disposition:   Follow up with [facility], AHF clinic in 1-2 weeks, Dr Ladona Ridgel 1 year      Signed, Gypsy Balsam, NP 05/26/2017 1:43 PM  [facility] [address] [place] [address] [phone] (office) [phone] (fax)

## 2017-05-25 ENCOUNTER — Encounter: Payer: Self-pay | Admitting: Nurse Practitioner

## 2017-05-25 ENCOUNTER — Ambulatory Visit (INDEPENDENT_AMBULATORY_CARE_PROVIDER_SITE_OTHER): Payer: Medicare Other | Admitting: Nurse Practitioner

## 2017-05-25 VITALS — BP 137/66 | HR 76 | Ht 67.0 in | Wt 185.4 lb

## 2017-05-25 DIAGNOSIS — I482 Chronic atrial fibrillation: Secondary | ICD-10-CM

## 2017-05-25 DIAGNOSIS — Z720 Tobacco use: Secondary | ICD-10-CM | POA: Diagnosis not present

## 2017-05-25 DIAGNOSIS — I4821 Permanent atrial fibrillation: Secondary | ICD-10-CM

## 2017-05-25 DIAGNOSIS — I5023 Acute on chronic systolic (congestive) heart failure: Secondary | ICD-10-CM | POA: Diagnosis not present

## 2017-05-25 DIAGNOSIS — I10 Essential (primary) hypertension: Secondary | ICD-10-CM

## 2017-05-25 DIAGNOSIS — R0609 Other forms of dyspnea: Secondary | ICD-10-CM

## 2017-05-25 LAB — BASIC METABOLIC PANEL
BUN/Creatinine Ratio: 18 (ref 10–24)
BUN: 19 mg/dL (ref 8–27)
CALCIUM: 9.7 mg/dL (ref 8.6–10.2)
CHLORIDE: 97 mmol/L (ref 96–106)
CO2: 29 mmol/L (ref 20–29)
Creatinine, Ser: 1.06 mg/dL (ref 0.76–1.27)
GFR calc Af Amer: 82 mL/min/{1.73_m2} (ref >59)
GFR, EST NON AFRICAN AMERICAN: 71 mL/min/{1.73_m2} (ref >59)
Glucose: 286 mg/dL — ABNORMAL HIGH (ref 65–99)
POTASSIUM: 5.1 mmol/L (ref 3.5–5.2)
Sodium: 140 mmol/L (ref 134–144)

## 2017-05-25 LAB — PRO B NATRIURETIC PEPTIDE: NT-Pro BNP: 1108 pg/mL — ABNORMAL HIGH (ref 0–376)

## 2017-05-25 MED ORDER — TORSEMIDE 20 MG PO TABS: 60.0000 mg | ORAL_TABLET | Freq: Every day | ORAL | 3 refills | Status: DC

## 2017-05-25 NOTE — Patient Instructions (Addendum)
Medication Instructions:   STOP TAKING LASIX    START  TAKING TORSEMIDE 60 MG ONCE A DAY    If you need a refill on your cardiac medications before your next appointment, please call your pharmacy.  Labwork: BMT AND BNP  TODAY     BMET NEXT WEEK    Testing/Procedures:  NONE ORDERED  TODAY    Follow-Up:  WITH HEART FAILURE CLINIC AS SCHEDULED   Remote monitoring is used to monitor your Pacemaker of ICD from home. This monitoring reduces the number of office visits required to check your device to one time per year. It allows [place] to keep an eye on the functioning of your device to ensure it is working properly. You are scheduled for a device check from home in. 07-2017..You may send your transmission at any time that day. If you have a wireless device, the transmission will be sent automatically. After your physician reviews your transmission, you will receive a postcard with your next transmission date.     Any Other Special Instructions Will Be Listed Below (If Applicable).

## 2017-05-26 ENCOUNTER — Ambulatory Visit: Payer: Medicare Other | Admitting: Nurse Practitioner

## 2017-05-26 NOTE — Progress Notes (Signed)
[name], make his next appointment with me in the office, and make sure that it is soon. Thanks.

## 2017-06-02 ENCOUNTER — Other Ambulatory Visit: Payer: Medicare Other

## 2017-06-05 ENCOUNTER — Other Ambulatory Visit (HOSPITAL_COMMUNITY): Payer: Self-pay | Admitting: Internal Medicine

## 2017-06-06 ENCOUNTER — Encounter (HOSPITAL_COMMUNITY): Payer: Self-pay | Admitting: Emergency Medicine

## 2017-06-06 ENCOUNTER — Other Ambulatory Visit: Payer: Self-pay

## 2017-06-06 ENCOUNTER — Inpatient Hospital Stay (HOSPITAL_COMMUNITY)
Admission: EM | Admit: 2017-06-06 | Discharge: 2017-06-13 | DRG: 286 | Disposition: A | Discharge status: Home / Self Care | Payer: Medicare Other | Attending: Internal Medicine | Admitting: Internal Medicine

## 2017-06-06 ENCOUNTER — Emergency Department (HOSPITAL_COMMUNITY): Payer: Medicare Other

## 2017-06-06 DIAGNOSIS — Z9114 Patient's other noncompliance with medication regimen: Secondary | ICD-10-CM

## 2017-06-06 DIAGNOSIS — K219 Gastro-esophageal reflux disease without esophagitis: Secondary | ICD-10-CM | POA: Diagnosis present

## 2017-06-06 DIAGNOSIS — E1122 Type 2 diabetes mellitus with diabetic chronic kidney disease: Secondary | ICD-10-CM | POA: Diagnosis present

## 2017-06-06 DIAGNOSIS — R0600 Dyspnea, unspecified: Secondary | ICD-10-CM | POA: Diagnosis not present

## 2017-06-06 DIAGNOSIS — I5023 Acute on chronic systolic (congestive) heart failure: Secondary | ICD-10-CM

## 2017-06-06 DIAGNOSIS — I482 Chronic atrial fibrillation, unspecified: Secondary | ICD-10-CM | POA: Diagnosis present

## 2017-06-06 DIAGNOSIS — I442 Atrioventricular block, complete: Secondary | ICD-10-CM | POA: Diagnosis present

## 2017-06-06 DIAGNOSIS — F1010 Alcohol abuse, uncomplicated: Secondary | ICD-10-CM | POA: Diagnosis present

## 2017-06-06 DIAGNOSIS — Z9981 Dependence on supplemental oxygen: Secondary | ICD-10-CM

## 2017-06-06 DIAGNOSIS — F0390 Unspecified dementia without behavioral disturbance: Secondary | ICD-10-CM | POA: Diagnosis present

## 2017-06-06 DIAGNOSIS — Z95810 Presence of automatic (implantable) cardiac defibrillator: Secondary | ICD-10-CM

## 2017-06-06 DIAGNOSIS — I255 Ischemic cardiomyopathy: Secondary | ICD-10-CM | POA: Diagnosis present

## 2017-06-06 DIAGNOSIS — I10 Essential (primary) hypertension: Secondary | ICD-10-CM | POA: Diagnosis not present

## 2017-06-06 DIAGNOSIS — Z7401 Bed confinement status: Secondary | ICD-10-CM

## 2017-06-06 DIAGNOSIS — R531 Weakness: Secondary | ICD-10-CM | POA: Diagnosis present

## 2017-06-06 DIAGNOSIS — I428 Other cardiomyopathies: Secondary | ICD-10-CM | POA: Diagnosis present

## 2017-06-06 DIAGNOSIS — E7849 Other hyperlipidemia: Secondary | ICD-10-CM | POA: Diagnosis not present

## 2017-06-06 DIAGNOSIS — E785 Hyperlipidemia, unspecified: Secondary | ICD-10-CM | POA: Diagnosis present

## 2017-06-06 DIAGNOSIS — Z955 Presence of coronary angioplasty implant and graft: Secondary | ICD-10-CM

## 2017-06-06 DIAGNOSIS — R748 Abnormal levels of other serum enzymes: Secondary | ICD-10-CM | POA: Diagnosis not present

## 2017-06-06 DIAGNOSIS — J449 Chronic obstructive pulmonary disease, unspecified: Secondary | ICD-10-CM | POA: Diagnosis present

## 2017-06-06 DIAGNOSIS — Z7901 Long term (current) use of anticoagulants: Secondary | ICD-10-CM

## 2017-06-06 DIAGNOSIS — I25118 Atherosclerotic heart disease of native coronary artery with other forms of angina pectoris: Secondary | ICD-10-CM | POA: Diagnosis not present

## 2017-06-06 DIAGNOSIS — Z9119 Patient's noncompliance with other medical treatment and regimen: Secondary | ICD-10-CM

## 2017-06-06 DIAGNOSIS — Z86718 Personal history of other venous thrombosis and embolism: Secondary | ICD-10-CM

## 2017-06-06 DIAGNOSIS — I5043 Acute on chronic combined systolic (congestive) and diastolic (congestive) heart failure: Secondary | ICD-10-CM | POA: Diagnosis present

## 2017-06-06 DIAGNOSIS — R402413 Glasgow coma scale score 13-15, at hospital admission: Secondary | ICD-10-CM | POA: Diagnosis present

## 2017-06-06 DIAGNOSIS — J9621 Acute and chronic respiratory failure with hypoxia: Secondary | ICD-10-CM | POA: Diagnosis present

## 2017-06-06 DIAGNOSIS — N179 Acute kidney failure, unspecified: Secondary | ICD-10-CM | POA: Diagnosis present

## 2017-06-06 DIAGNOSIS — Z8673 Personal history of transient ischemic attack (TIA), and cerebral infarction without residual deficits: Secondary | ICD-10-CM

## 2017-06-06 DIAGNOSIS — Z888 Allergy status to other drugs, medicaments and biological substances status: Secondary | ICD-10-CM

## 2017-06-06 DIAGNOSIS — Z951 Presence of aortocoronary bypass graft: Secondary | ICD-10-CM

## 2017-06-06 DIAGNOSIS — E1151 Type 2 diabetes mellitus with diabetic peripheral angiopathy without gangrene: Secondary | ICD-10-CM | POA: Diagnosis present

## 2017-06-06 DIAGNOSIS — I130 Hypertensive heart and chronic kidney disease with heart failure and stage 1 through stage 4 chronic kidney disease, or unspecified chronic kidney disease: Principal | ICD-10-CM | POA: Diagnosis present

## 2017-06-06 DIAGNOSIS — I2510 Atherosclerotic heart disease of native coronary artery without angina pectoris: Secondary | ICD-10-CM | POA: Diagnosis present

## 2017-06-06 DIAGNOSIS — I739 Peripheral vascular disease, unspecified: Secondary | ICD-10-CM | POA: Diagnosis not present

## 2017-06-06 DIAGNOSIS — Z952 Presence of prosthetic heart valve: Secondary | ICD-10-CM

## 2017-06-06 DIAGNOSIS — F17210 Nicotine dependence, cigarettes, uncomplicated: Secondary | ICD-10-CM | POA: Diagnosis present

## 2017-06-06 DIAGNOSIS — N183 Chronic kidney disease, stage 3 (moderate): Secondary | ICD-10-CM | POA: Diagnosis present

## 2017-06-06 DIAGNOSIS — R5383 Other fatigue: Secondary | ICD-10-CM | POA: Diagnosis present

## 2017-06-06 DIAGNOSIS — I248 Other forms of acute ischemic heart disease: Secondary | ICD-10-CM | POA: Diagnosis present

## 2017-06-06 DIAGNOSIS — Z7984 Long term (current) use of oral hypoglycemic drugs: Secondary | ICD-10-CM

## 2017-06-06 DIAGNOSIS — Z79899 Other long term (current) drug therapy: Secondary | ICD-10-CM

## 2017-06-06 DIAGNOSIS — I509 Heart failure, unspecified: Secondary | ICD-10-CM

## 2017-06-06 HISTORY — DX: Psoriasis, unspecified: L40.9

## 2017-06-06 LAB — CBC
HCT: 41.6 % (ref 39.0–52.0)
HEMOGLOBIN: 12.9 g/dL — AB (ref 13.0–17.0)
MCH: 29 pg (ref 26.0–34.0)
MCHC: 31 g/dL (ref 30.0–36.0)
MCV: 93.5 fL (ref 78.0–100.0)
Platelets: 229 10*3/uL (ref 150–400)
RBC: 4.45 MIL/uL (ref 4.22–5.81)
RDW: 14.3 % (ref 11.5–15.5)
WBC: 11.5 10*3/uL — AB (ref 4.0–10.5)

## 2017-06-06 LAB — URINALYSIS, ROUTINE W REFLEX MICROSCOPIC
BILIRUBIN URINE: NEGATIVE
Bacteria, UA: NONE SEEN
HGB URINE DIPSTICK: NEGATIVE
KETONES UR: NEGATIVE mg/dL
LEUKOCYTES UA: NEGATIVE
Nitrite: NEGATIVE
PROTEIN: NEGATIVE mg/dL
RBC / HPF: NONE SEEN RBC/hpf (ref 0–5)
Specific Gravity, Urine: 1.015 (ref 1.005–1.030)
Squamous Epithelial / LPF: NONE SEEN
pH: 7 (ref 5.0–8.0)

## 2017-06-06 LAB — BASIC METABOLIC PANEL
ANION GAP: 12 (ref 5–15)
BUN: 22 mg/dL — ABNORMAL HIGH (ref 6–20)
CALCIUM: 9.8 mg/dL (ref 8.9–10.3)
CHLORIDE: 95 mmol/L — AB (ref 101–111)
CO2: 32 mmol/L (ref 22–32)
Creatinine, Ser: 1.28 mg/dL — ABNORMAL HIGH (ref 0.61–1.24)
GFR calc non Af Amer: 55 mL/min — ABNORMAL LOW (ref >60)
Glucose, Bld: 231 mg/dL — ABNORMAL HIGH (ref 65–99)
Potassium: 3.7 mmol/L (ref 3.5–5.1)
Sodium: 139 mmol/L (ref 135–145)

## 2017-06-06 LAB — GLUCOSE, CAPILLARY: GLUCOSE-CAPILLARY: 157 mg/dL — AB (ref 65–99)

## 2017-06-06 LAB — TROPONIN I: Troponin I: 0.06 ng/mL (ref <0.03)

## 2017-06-06 LAB — BRAIN NATRIURETIC PEPTIDE: B Natriuretic Peptide: 486.4 pg/mL — ABNORMAL HIGH (ref 0.0–100.0)

## 2017-06-06 MED ORDER — APIXABAN 5 MG PO TABS
5.0000 mg | ORAL_TABLET | Freq: Two times a day (BID) | ORAL | Status: DC
  Administered 2017-06-06 – 2017-06-07 (×3): 5 mg via ORAL
  Filled 2017-06-06 (×3): qty 1

## 2017-06-06 MED ORDER — CARVEDILOL 6.25 MG PO TABS
6.2500 mg | ORAL_TABLET | Freq: Two times a day (BID) | ORAL | Status: DC
  Filled 2017-06-06 (×2): qty 1

## 2017-06-06 MED ORDER — INSULIN ASPART 100 UNIT/ML ~~LOC~~ SOLN
0.0000 [IU] | Freq: Three times a day (TID) | SUBCUTANEOUS | Status: DC
  Administered 2017-06-07: 2 [IU] via SUBCUTANEOUS
  Administered 2017-06-07: 3 [IU] via SUBCUTANEOUS
  Administered 2017-06-07 – 2017-06-10 (×7): 2 [IU] via SUBCUTANEOUS
  Administered 2017-06-11 (×2): 3 [IU] via SUBCUTANEOUS
  Administered 2017-06-11: 1 [IU] via SUBCUTANEOUS
  Administered 2017-06-12: 3 [IU] via SUBCUTANEOUS
  Administered 2017-06-12: 1 [IU] via SUBCUTANEOUS
  Administered 2017-06-12 – 2017-06-13 (×2): 2 [IU] via SUBCUTANEOUS

## 2017-06-06 MED ORDER — SODIUM CHLORIDE 0.9% FLUSH: 3.0000 mL | INTRAVENOUS | Status: DC | PRN

## 2017-06-06 MED ORDER — INSULIN ASPART 100 UNIT/ML ~~LOC~~ SOLN
0.0000 [IU] | Freq: Every day | SUBCUTANEOUS | Status: DC
  Administered 2017-06-09 – 2017-06-11 (×2): 3 [IU] via SUBCUTANEOUS

## 2017-06-06 MED ORDER — ONDANSETRON HCL 4 MG/2ML IJ SOLN: 4.0000 mg | Freq: Four times a day (QID) | INTRAMUSCULAR | Status: DC | PRN

## 2017-06-06 MED ORDER — FENOFIBRATE 54 MG PO TABS
54.0000 mg | ORAL_TABLET | Freq: Every day | ORAL | Status: DC
  Administered 2017-06-06 – 2017-06-13 (×8): 54 mg via ORAL
  Filled 2017-06-06 (×9): qty 1

## 2017-06-06 MED ORDER — SACUBITRIL-VALSARTAN 49-51 MG PO TABS
1.0000 | ORAL_TABLET | Freq: Two times a day (BID) | ORAL | Status: DC
  Administered 2017-06-06 – 2017-06-07 (×3): 1 via ORAL
  Filled 2017-06-06 (×5): qty 1

## 2017-06-06 MED ORDER — VITAMIN B-1 100 MG PO TABS
100.0000 mg | ORAL_TABLET | Freq: Every day | ORAL | Status: DC
  Administered 2017-06-07 – 2017-06-13 (×7): 100 mg via ORAL
  Filled 2017-06-06 (×7): qty 1

## 2017-06-06 MED ORDER — ADULT MULTIVITAMIN W/MINERALS CH
1.0000 | ORAL_TABLET | Freq: Every day | ORAL | Status: DC
  Administered 2017-06-07 – 2017-06-13 (×7): 1 via ORAL
  Filled 2017-06-06 (×7): qty 1

## 2017-06-06 MED ORDER — ACETAMINOPHEN 325 MG PO TABS: 650.0000 mg | ORAL_TABLET | ORAL | Status: DC | PRN

## 2017-06-06 MED ORDER — SODIUM CHLORIDE 0.9% FLUSH
3.0000 mL | Freq: Two times a day (BID) | INTRAVENOUS | Status: DC
  Administered 2017-06-06 – 2017-06-13 (×11): 3 mL via INTRAVENOUS

## 2017-06-06 MED ORDER — GUAIFENESIN-DM 100-10 MG/5ML PO SYRP
5.0000 mL | ORAL_SOLUTION | ORAL | Status: DC | PRN
  Administered 2017-06-06: 5 mL via ORAL
  Filled 2017-06-06: qty 5

## 2017-06-06 MED ORDER — SODIUM CHLORIDE 0.9 % IV SOLN: 250.0000 mL | INTRAVENOUS | Status: DC | PRN

## 2017-06-06 MED ORDER — ATORVASTATIN CALCIUM 40 MG PO TABS
40.0000 mg | ORAL_TABLET | Freq: Every day | ORAL | Status: DC
  Administered 2017-06-06 – 2017-06-13 (×8): 40 mg via ORAL
  Filled 2017-06-06 (×8): qty 1

## 2017-06-06 MED ORDER — LORATADINE 10 MG PO TABS
10.0000 mg | ORAL_TABLET | Freq: Every day | ORAL | Status: DC
  Administered 2017-06-07 – 2017-06-13 (×7): 10 mg via ORAL
  Filled 2017-06-06 (×7): qty 1

## 2017-06-06 MED ORDER — FUROSEMIDE 10 MG/ML IJ SOLN
60.0000 mg | Freq: Two times a day (BID) | INTRAMUSCULAR | Status: DC
  Administered 2017-06-06 – 2017-06-07 (×2): 60 mg via INTRAVENOUS
  Filled 2017-06-06 (×2): qty 6

## 2017-06-06 MED ORDER — FAMOTIDINE 20 MG PO TABS
20.0000 mg | ORAL_TABLET | Freq: Two times a day (BID) | ORAL | Status: DC
  Administered 2017-06-06 – 2017-06-13 (×14): 20 mg via ORAL
  Filled 2017-06-06 (×14): qty 1

## 2017-06-06 MED ORDER — FUROSEMIDE 10 MG/ML IJ SOLN
80.0000 mg | Freq: Once | INTRAMUSCULAR | Status: AC
  Administered 2017-06-06: 80 mg via INTRAVENOUS
  Filled 2017-06-06: qty 8

## 2017-06-06 MED ORDER — FEXOFENADINE HCL 180 MG PO TABS: 180.0000 mg | ORAL_TABLET | Freq: Every day | ORAL | Status: DC

## 2017-06-06 NOTE — ED Provider Notes (Signed)
[facility] EMERGENCY DEPARTMENT Provider Note   CSN: [id number] Arrival date & time: 06/06/17  1324     History   Chief Complaint Chief Complaint  Patient presents with  . Fatigue  . Weakness    HPI [name] is a 71 y.o. male.  HPI  71 year old male with a history of coronary disease, chronic atrial fibrillation, chronic systolic heart failure presents with weakness and fatigue.  History is taken mostly from the family.  The patient has had progressive symptoms for about 5 weeks.  Family is concerned it is medication related.  Today, family was asking if he was ready to go on a trip but he was hesitant because he states he was feeling weak.  Thus they wanted to bring him in for evaluation.  Patient recently had his carvedilol adjusted as well as about 12 days ago was switched from furosemide to torsemide.  However over the last several weeks the patient has had progressive abdominal swelling.  He has been having so much swelling that his hernia is hard to see.  He has been having on and off shortness of breath.  No chest pain.  He usually accumulates fluid in his abdomen.  Family states he is supposed to be at 155 pounds but they estimate he is about 185 pounds today.  He has been compliant with diet per them and they strictly avoid salt.  He has been mostly compliant with his torsemide.  No urinary symptoms.  No headaches or focal weakness.  Patient has oxygen at home to use at night.  However he sleeps most of the day and sometimes is hard to arouse to sleep.  He uses 2.5 L.   Past Medical History:  Diagnosis Date  . AICD (automatic cardioverter/defibrillator) present   . Alcohol abuse   . Anxiety   . Bipolar disorder (HCC)   . Biventricular automatic implantable cardioverter defibrillator in situ    a. 01/2014 s/p MDT [id number] Viva XT CRT-D (ser # [id number]).  . CAD (coronary artery disease)    a. s/p prior PCI/stenting of the LCX;  b. 08/2014 MV: EF 20%,  large septal, apical, and inferior infarct from apex to base, no ischemia;  c. 08/2014 NSTEMI/Cath: LM mod distal dzs extending into ostial LCX (80%), LAD tortuous, RI nl, OM mod dzs, RCA 100 CTO with R->R and L->R collats-->Med Rx.  . Chronic atrial fibrillation (HCC)    a. CHA2DS2VASc = 6->eliquis;  b. S/P AVN RFCA an BiV ICD placement.  . Chronic respiratory failure (HCC)   . Chronic systolic congestive heart failure (HCC)    a. 01/2014 Echo: EF 25-30%, mod conc LVH, mod dil LA.  . CKD (chronic kidney disease)    a. suspect stage II-III based on historical labs.  . Complete heart block (HCC)    a. In setting of prior AV nodal ablation r/t afib-->BiV ICD (01/2014).  . COPD (chronic obstructive pulmonary disease) (HCC)   . CVA (cerebral vascular accident) (HCC)    a. Multiple prior embolic strokes.  . Dementia   . Depression   . DVT (deep venous thrombosis) (HCC)   . Dyslipidemia   . Hyperglycemia   . Hypertension   . Mitral valve disease    a. remote mitral replacement with Bjork Shiley valve;  b. 06/2006 Redo MVR with tissue valve.  . Mixed Ischemic and Nonischemic Cardiomyopathy    a. 8/.2015 Echo: Ef 25-30%;  b. 01/2014 s/p MDT [id number] Coletta Memos CRT-D (ser #  [id number]).  . On home oxygen therapy    "3L at night" (11/23/2015)  . Ventral hernia     Patient Active Problem List   Diagnosis Date Noted  . Acute respiratory failure with hypoxia and hypercapnia (HCC)   . COPD GOLD 0 still smoking  03/02/2016  . Chronic kidney disease (CKD), stage II (mild) 11/23/2015  . CAD (coronary artery disease)   . Dementia   . Chronic respiratory failure with hypoxia (HCC) 10/11/2015  . Chronic systolic CHF (congestive heart failure) (HCC) 05/30/2015  . Cigarette smoker 12/24/2014  . Stroke (HCC)   . Mitral valve disease   . Depression   . Mixed Ischemic and Nonischemic Cardiomyopathy   . Alcohol abuse   . Chronic anticoagulation 08/22/2014  . PVD (peripheral vascular disease) (HCC) 02/28/2014    . Mural thrombus of heart 02/07/2014  . Hypertension 06/14/2012  . Automatic implantable cardioverter-defibrillator in situ 04/04/2012  . Hyperlipidemia 10/27/2011  . Ventricular tachycardia (HCC) 07/14/2011  . Atrial fibrillation, chronic (HCC) 07/13/2011  . Right atrial thrombus (HCC) 07/11/2011    Past Surgical History:  Procedure Laterality Date  . AV NODE ABLATION  2007  . CARDIAC CATHETERIZATION  06/2006   Mild ostial L main stenosis, CFX stent patent, RCA occluded (old)  . CORONARY ARTERY BYPASS GRAFT  2008  . CYSTOSCOPY W/ URETERAL STENT PLACEMENT  07/12/2011   Procedure: CYSTOSCOPY WITH RETROGRADE PYELOGRAM/URETERAL STENT PLACEMENT;  Surgeon: Sebastian Ache, MD;  Location: [facility] OR;  Service: Urology;  Laterality: Left;  . HERNIA REPAIR    . ICD GENERATOR CHANGE  02/01/2014   Gen change to: Medtronic VIVA pulse generator, serial number [id number]  . INSERT / REPLACE / REMOVE PACEMAKER    . LEFT HEART CATHETERIZATION WITH CORONARY ANGIOGRAM N/A 08/23/2014   Procedure: LEFT HEART CATHETERIZATION WITH CORONARY ANGIOGRAM;  Surgeon: Corky Crafts, MD;  Location: [facility] CATH LAB;  Service: Cardiovascular;  Laterality: N/A;  . MITRAL VALVE REPLACEMENT     remote [facility] valve 1308 with redo tissue valve 06/2006  . PACEMAKER GENERATOR CHANGE N/A 02/01/2014   Procedure: PACEMAKER GENERATOR CHANGE;  Surgeon: Duke Salvia, MD;  Location: [facility] CATH LAB;  Service: Cardiovascular;  Laterality: N/A;  . PACEMAKER PLACEMENT  2006   Changed to CRT-D in 2009       Home Medications    Prior to Admission medications   Medication Sig Start Date End Date Taking? Authorizing Provider  apixaban (ELIQUIS) 5 MG TABS tablet Take 1 tablet (5 mg total) by mouth 2 (two) times daily. 02/12/17  Yes Little Ishikawa, NP  atorvastatin (LIPITOR) 40 MG tablet Take 1 tablet (40 mg total) by mouth daily. 05/13/16  Yes Clegg, Amy D, NP  carvedilol (COREG) 6.25 MG tablet Take 6.25 mg (1 tab) in am and 9.375 mg  (1.5 tabs) in pm Patient taking differently: 6.25 mg 2 (two) times daily with a meal. Take 6.25 mg (1 tab) in am and 9.375 mg (1.5 tabs) in pm 04/14/17  Yes Clegg, Amy D, NP  fenofibrate (TRICOR) 48 MG tablet TAKE 1 TABLET BY MOUTH EVERY DAY 02/16/17  Yes [name], MD  Fexofenadine HCl (ALLERGY 24-HR PO) Take 1 tablet by mouth daily.   Yes [provider]  glipiZIDE (GLUCOTROL) 5 MG tablet Take 5 mg daily before breakfast by mouth.   Yes [provider]  Multiple Vitamin (MULTIVITAMIN WITH MINERALS) TABS tablet Take 1 tablet by mouth daily. 05/24/15  Yes Osvaldo Shipper, MD  OXYGEN  Inhale 2.5 L into the lungs.   Yes [provider]  ranitidine (ZANTAC) 150 MG tablet Take 150 mg by mouth daily as needed for heartburn.   Yes [provider]  sacubitril-valsartan (ENTRESTO) 49-51 MG Take 1 tablet by mouth 2 (two) times daily. 02/12/17  Yes Little Ishikawa, NP  spironolactone (ALDACTONE) 25 MG tablet Take 0.5 tablets (12.5 mg total) by mouth daily. 01/29/17  Yes [name], MD  thiamine 100 MG tablet Take 1 tablet (100 mg total) by mouth daily. 05/24/15  Yes Osvaldo Shipper, MD  torsemide (DEMADEX) 20 MG tablet Take 3 tablets (60 mg total) by mouth daily. 05/25/17  Yes Gypsy Balsam K, NP  albuterol (PROAIR HFA) 108 (90 Base) MCG/ACT inhaler 2 puffs every 4 hours as needed only  if your can't catch your breath Patient not taking: Reported on 06/06/2017 03/31/16   Nyoka Cowden, MD  nitroGLYCERIN (NITROSTAT) 0.4 MG SL tablet Place 1 tablet (0.4 mg total) under the tongue every 5 (five) minutes as needed for chest pain. 01/06/15   Allayne Butcher, PA-C    Family History Family History  Problem Relation Age of Onset  . Alzheimer's disease Mother   . Heart attack Father   . Heart disease Father   . Heart attack Son   . Varicose Veins Son   . Deep vein thrombosis Son   . Heart disease Son   . Stroke Unknown   . Heart disease Unknown   . Heart  disease Brother     Social History Social History   Tobacco Use  . Smoking status: Current Every Day Smoker    Packs/day: 2.00    Years: 42.00    Pack years: 84.00    Types: Cigarettes  . Smokeless tobacco: Never Used  Substance Use Topics  . Alcohol use: Yes    Alcohol/week: 0.0 oz    Comment: 11/22/2015 "6 pack of beer/month"  . Drug use: No     Allergies   Warfarin   Review of Systems Review of Systems  Constitutional: Positive for unexpected weight change. Negative for fever.  Respiratory: Positive for shortness of breath.   Cardiovascular: Negative for chest pain and leg swelling.  Gastrointestinal: Positive for abdominal distention. Negative for abdominal pain.  Neurological: Positive for weakness. Negative for headaches.  All other systems reviewed and are negative.    Physical Exam Updated Vital Signs BP (!) 136/59   Pulse (!) 59   Temp 98.1 F (36.7 C) (Oral)   Resp (!) 26   Ht 5\' 7"  (1.702 m)   Wt 78.4 kg (172 lb 12.8 oz)   SpO2 92%   BMI 27.06 kg/m   Physical Exam  Constitutional: He is oriented to person, place, and time. He appears well-developed and well-nourished. No distress.  HENT:  Head: Normocephalic and atraumatic.  Right Ear: External ear normal.  Left Ear: External ear normal.  Nose: Nose normal.  Eyes: Right eye exhibits no discharge. Left eye exhibits no discharge.  Neck: Neck supple. JVD present.  Cardiovascular: Normal rate, regular rhythm and normal heart sounds.  Pulmonary/Chest: Effort normal. Tachypnea noted. He has decreased breath sounds in the right lower field and the left lower field. He has rales in the right lower field and the left lower field.  Abdominal: Soft. There is no tenderness. A hernia is present. Hernia confirmed positive in the ventral area.  Musculoskeletal: He exhibits edema (trace lower extremity swelling).  Neurological: He is alert and oriented  to person, place, and time.  Skin: Skin is warm and dry.  He is not diaphoretic.  Nursing note and vitals reviewed.    ED Treatments / Results  Labs (all labs ordered are listed, but only abnormal results are displayed) Labs Reviewed  BASIC METABOLIC PANEL - Abnormal; Notable for the following components:      Result Value   Chloride 95 (*)    Glucose, Bld 231 (*)    BUN 22 (*)    Creatinine, Ser 1.28 (*)    GFR calc non Af Amer 55 (*)    All other components within normal limits  CBC - Abnormal; Notable for the following components:   WBC 11.5 (*)    Hemoglobin 12.9 (*)    All other components within normal limits  URINALYSIS, ROUTINE W REFLEX MICROSCOPIC - Abnormal; Notable for the following components:   Glucose, UA >=500 (*)    All other components within normal limits  BRAIN NATRIURETIC PEPTIDE - Abnormal; Notable for the following components:   B Natriuretic Peptide 486.4 (*)    All other components within normal limits  TROPONIN I  CBG MONITORING, ED    EKG  EKG Interpretation  Date/Time:  Sunday June 06 2017 10:37:56 EST Ventricular Rate:  67 PR Interval:    QRS Duration: 164 QT Interval:  492 QTC Calculation: 519 R Axis:   -90 Text Interpretation:  Electronic ventricular pacemaker no significant change since Aug 2018 Confirmed by Pricilla Loveless [phone]) on 06/06/2017 3:09:21 PM       Radiology Dg Chest 2 View  Result Date: 06/06/2017 CLINICAL DATA:  Daughter stated, He is been sleeping for about 23 hours a day will go to the BR and then go back to bed. Eating but not as much as usual. Has Hx of copd,cad.ckd EXAM: CHEST  2 VIEW COMPARISON:  12/08/2016 FINDINGS: Prior median sternotomy. Pacer/AICD device. Mitral valve replacement. Midline trachea. Moderate cardiomegaly. Atherosclerosis in the transverse aorta. No right and no definite left pleural fluid. Remote right rib trauma. Moderate pulmonary interstitial prominence and indistinctness, progressive. Left pleural calcifications. Mild right hemidiaphragm  elevation. IMPRESSION: Cardiomegaly with progressive pulmonary interstitial prominence. Favored to represent mild pulmonary venous congestion, superimposed upon the sequelae of smoking/ chronic bronchitis. Aortic Atherosclerosis (ICD10-I70.0). Electronically Signed   By: Jeronimo Greaves M.D.   On: 06/06/2017 13:53    Procedures Procedures (including critical care time)  Medications Ordered in ED Medications  furosemide (LASIX) injection 80 mg (80 mg Intravenous Given 06/06/17 1612)     Initial Impression / Assessment and Plan / ED Course  I have reviewed the triage vital signs and the nursing notes.  Pertinent labs & imaging results that were available during my care of the patient were reviewed by me and considered in my medical decision making (see chart for details).     Patient's weight gain, abdominal swelling, and shortness of breath appear to be related to worsening CHF.  He will be given IV Lasix and will need admission for IV diuresis.  As far as his progressive fatigue, unclear etiology.  It could be medication related.  His neuro exam is unremarkable and when he is awake he is alert and appropriate and not altered.  No acute infectious symptoms.  Urine negative.  Admit to the hospitalist for diuresis.  Final Clinical Impressions(s) / ED Diagnoses   Final diagnoses:  Acute on chronic systolic congestive heart failure [facility])    ED Discharge Orders    None  Pricilla LovelessGoldston, Lakyia Behe, MD 06/06/17 [phone]

## 2017-06-06 NOTE — ED Triage Notes (Signed)
Daughter stated, He is been sleeping for about 23 hours a day will go to the BR and then go back to bed. Eating but not as much as usual. Has been Been to Dr. And I think its myu medication making me so tired.

## 2017-06-06 NOTE — ED Notes (Signed)
0.06 troponin received from lab

## 2017-06-06 NOTE — ED Notes (Signed)
Pt up to restroom with assistance; independent toileting

## 2017-06-06 NOTE — ED Notes (Addendum)
Pt back to bed and placed back on cardiac monitor; O2 sat noted to be in the 80's; nasal O2 applied and sats improved; pt also exhibits increased WOB, denies CP

## 2017-06-06 NOTE — H&P (Addendum)
[name] History and Physical  [name] ZOX:[id number] DOB: [birth year] DOA: 06/06/2017  PCP: Georgann Housekeeper, MD  Patient coming from: Home  Chief Complaint: Shortness of breath, weakness, fatigue  HPI: [name] is a 71 y.o. male with a medical history of CHF, atrial fibrillation, coronary disease, who presents to the emergency department with complaints of increasing shortness of breath, fatigue, weight gain. Her daughter, this has been ongoing for 5 weeks. Patient has been in bed for approximately 23 hours per day and eating very little. Patient recently saw his cardiologist 2 weeks ago and was placed on torsemide. Family states he is supposed to 155 pounds however today he weighs approximately 185 pounds. Patient uses 2.5 L of chronic oxygen at home. Patient denies any recent illness or travel. Denies any sick contacts, current chest pain, abdominal pain, nausea vomiting, diarrhea constipation, changes in bowel pattern habits, any urinary frequency. There is very concerned about patient's fatigue. States he has been placed and "all these medications that don't seem to help him."  ED Course: X-ray obtained showing some vascular congestion. BNP also elevated. [facility] called for admission.  Review of Systems:  All other systems reviewed and are negative.   Past Medical History:  Diagnosis Date  . AICD (automatic cardioverter/defibrillator) present   . Alcohol abuse   . Anxiety   . Bipolar disorder (HCC)   . Biventricular automatic implantable cardioverter defibrillator in situ    a. 01/2014 s/p MDT [id number] Viva XT CRT-D (ser # [id number]).  . CAD (coronary artery disease)    a. s/p prior PCI/stenting of the LCX;  b. 08/2014 MV: EF 20%, large septal, apical, and inferior infarct from apex to base, no ischemia;  c. 08/2014 NSTEMI/Cath: LM mod distal dzs extending into ostial LCX (80%), LAD tortuous, RI nl, OM mod dzs, RCA 100 CTO with R->R and L->R collats-->Med Rx.  . Chronic  atrial fibrillation (HCC)    a. CHA2DS2VASc = 6->eliquis;  b. S/P AVN RFCA an BiV ICD placement.  . Chronic respiratory failure (HCC)   . Chronic systolic congestive heart failure (HCC)    a. 01/2014 Echo: EF 25-30%, mod conc LVH, mod dil LA.  . CKD (chronic kidney disease)    a. suspect stage II-III based on historical labs.  . Complete heart block (HCC)    a. In setting of prior AV nodal ablation r/t afib-->BiV ICD (01/2014).  . COPD (chronic obstructive pulmonary disease) (HCC)   . CVA (cerebral vascular accident) (HCC)    a. Multiple prior embolic strokes.  . Dementia   . Depression   . DVT (deep venous thrombosis) (HCC)   . Dyslipidemia   . Hyperglycemia   . Hypertension   . Mitral valve disease    a. remote mitral replacement with Bjork Shiley valve;  b. 06/2006 Redo MVR with tissue valve.  . Mixed Ischemic and Nonischemic Cardiomyopathy    a. 8/.2015 Echo: Ef 25-30%;  b. 01/2014 s/p MDT [id number] Talbot Grumbling XT CRT-D (ser # [id number]).  . On home oxygen therapy    "3L at night" (11/23/2015)  . Ventral hernia     Past Surgical History:  Procedure Laterality Date  . AV NODE ABLATION  2007  . CARDIAC CATHETERIZATION  06/2006   Mild ostial L main stenosis, CFX stent patent, RCA occluded (old)  . CORONARY ARTERY BYPASS GRAFT  2008  . CYSTOSCOPY W/ URETERAL STENT PLACEMENT  07/12/2011   Procedure: CYSTOSCOPY WITH RETROGRADE PYELOGRAM/URETERAL STENT PLACEMENT;  Surgeon: Sebastian Ache,  MD;  Location: [facility] OR;  Service: Urology;  Laterality: Left;  . HERNIA REPAIR    . ICD GENERATOR CHANGE  02/01/2014   Gen change to: Medtronic VIVA pulse generator, serial number [id number]  . INSERT / REPLACE / REMOVE PACEMAKER    . LEFT HEART CATHETERIZATION WITH CORONARY ANGIOGRAM N/A 08/23/2014   Procedure: LEFT HEART CATHETERIZATION WITH CORONARY ANGIOGRAM;  Surgeon: Corky CraftsJayadeep S Varanasi, MD;  [facility];  Service: Cardiovascular;  Laterality: N/A;  . MITRAL VALVE REPLACEMENT     [facility] [id number] with redo tissue valve 06/2006  . PACEMAKER GENERATOR CHANGE N/A 02/01/2014   Procedure: PACEMAKER GENERATOR CHANGE;  Surgeon: Duke SalviaSteven C Klein, MD;  [facility];  Service: Cardiovascular;  Laterality: N/A;  . PACEMAKER PLACEMENT  2006   Changed to CRT-D in 2009    Social History:  reports that he has been smoking cigarettes.  He has a 84.00 pack-year smoking history. he has never used smokeless tobacco. He reports that he drinks alcohol. He reports that he does not use drugs.   Allergies  Allergen Reactions  . Warfarin Other (See Comments)    Non compliance and ETOH abuse    Family History  Problem Relation Age of Onset  . Alzheimer's disease Mother   . Heart attack Father   . Heart disease Father   . Heart attack Son   . Varicose Veins Son   . Deep vein thrombosis Son   . Heart disease Son   . Stroke Unknown   . Heart disease Unknown   . Heart disease Brother      Prior to Admission medications   Medication Sig Start Date End Date Taking? Authorizing Provider  apixaban (ELIQUIS) 5 MG TABS tablet Take 1 tablet (5 mg total) by mouth 2 (two) times daily. 02/12/17  Yes Little IshikawaSmith, Erin E, NP  atorvastatin (LIPITOR) 40 MG tablet Take 1 tablet (40 mg total) by mouth daily. 05/13/16  Yes Clegg, Amy D, NP  carvedilol (COREG) 6.25 MG tablet Take 6.25 mg (1 tab) in am and 9.375 mg (1.5 tabs) in pm Patient taking differently: 6.25 mg 2 (two) times daily with a meal. Take 6.25 mg (1 tab) in am and 9.375 mg (1.5 tabs) in pm 04/14/17  Yes Clegg, Amy D, NP  fenofibrate (TRICOR) 48 MG tablet TAKE 1 TABLET BY MOUTH EVERY DAY 02/16/17  Yes Laurey MoraleMcLean, Dalton S, MD  Fexofenadine HCl (ALLERGY 24-HR PO) Take 1 tablet by mouth daily.   Yes [provider]  glipiZIDE (GLUCOTROL) 5 MG tablet Take 5 mg daily before breakfast by mouth.   Yes [provider]  Multiple Vitamin (MULTIVITAMIN WITH MINERALS) TABS tablet Take 1 tablet by mouth daily. 05/24/15  Yes Osvaldo ShipperKrishnan,  Gokul, MD  OXYGEN Inhale 2.5 L into the lungs.   Yes [provider]  ranitidine (ZANTAC) 150 MG tablet Take 150 mg by mouth daily as needed for heartburn.   Yes [provider]  sacubitril-valsartan (ENTRESTO) 49-51 MG Take 1 tablet by mouth 2 (two) times daily. 02/12/17  Yes Little IshikawaSmith, Erin E, NP  spironolactone (ALDACTONE) 25 MG tablet Take 0.5 tablets (12.5 mg total) by mouth daily. 01/29/17  Yes Laurey MoraleMcLean, Dalton S, MD  thiamine 100 MG tablet Take 1 tablet (100 mg total) by mouth daily. 05/24/15  Yes Osvaldo ShipperKrishnan, Gokul, MD  torsemide (DEMADEX) 20 MG tablet Take 3 tablets (60 mg total) by mouth daily. 05/25/17  Yes Marily LenteSeiler, Amber K, NP  nitroGLYCERIN (NITROSTAT)  0.4 MG SL tablet Place 1 tablet (0.4 mg total) under the tongue every 5 (five) minutes as needed for chest pain. 01/06/15   Allayne Butcher, PA-C    Physical Exam: Vitals:   06/06/17 1600 06/06/17 1630  BP: 132/79 140/72  Pulse: (!) 59 (!) 59  Resp: (!) 27 (!) 23  Temp:    SpO2: 93% 96%     General: Well developed, well nourished, NAD, appears stated age  HEENT: NCAT, PERRLA, EOMI, Anicteic Sclera, mucous membranes moist.   Neck: Supple, +JVD, no masses  Cardiovascular: S1 S2 auscultated, no rubs, murmurs or gallops. Regular rate and rhythm.  Respiratory: Tachypneic, diminished breath sounds particularly in the lower fields  Abdomen: Soft, nontender, +distended, + bowel sounds, ventral hernia  Extremities: warm dry without cyanosis clubbing. Trace LE edema.   Neuro: AAOx3, cranial nerves grossly intact. Strength 5/5 in patient's upper and lower extremities bilaterally  Skin: Without rashes exudates or nodules. Psoriasis type plaque on elbows and posterior hairline   Psych: Normal affect and demeanor with intact judgement and insight  Labs on Admission: I have personally reviewed following labs and imaging studies CBC: Recent Labs  Lab 06/06/17 1037  WBC 11.5*  HGB 12.9*  HCT 41.6  MCV 93.5  PLT  229   Basic Metabolic Panel: Recent Labs  Lab 06/06/17 1037  NA 139  K 3.7  CL 95*  CO2 32  GLUCOSE 231*  BUN 22*  CREATININE 1.28*  CALCIUM 9.8   GFR: Estimated Creatinine Clearance: 50.9 mL/min (A) (by C-G formula based on SCr of 1.28 mg/dL (H)). Liver Function Tests: No results for input(s): AST, ALT, ALKPHOS, BILITOT, PROT, ALBUMIN in the last 168 hours. No results for input(s): LIPASE, AMYLASE in the last 168 hours. No results for input(s): AMMONIA in the last 168 hours. Coagulation Profile: No results for input(s): INR, PROTIME in the last 168 hours. Cardiac Enzymes: No results for input(s): CKTOTAL, CKMB, CKMBINDEX, TROPONINI in the last 168 hours. BNP (last 3 results) Recent Labs    05/25/17 1152  PROBNP 1,108*   HbA1C: No results for input(s): HGBA1C in the last 72 hours. CBG: No results for input(s): GLUCAP in the last 168 hours. Lipid Profile: No results for input(s): CHOL, HDL, LDLCALC, TRIG, CHOLHDL, LDLDIRECT in the last 72 hours. Thyroid Function Tests: No results for input(s): TSH, T4TOTAL, FREET4, T3FREE, THYROIDAB in the last 72 hours. Anemia Panel: No results for input(s): VITAMINB12, FOLATE, FERRITIN, TIBC, IRON, RETICCTPCT in the last 72 hours. Urine analysis:    Component Value Date/Time   COLORURINE YELLOW 06/06/2017 1542   APPEARANCEUR CLEAR 06/06/2017 1542   LABSPEC 1.015 06/06/2017 1542   PHURINE 7.0 06/06/2017 1542   GLUCOSEU >=500 (A) 06/06/2017 1542   HGBUR NEGATIVE 06/06/2017 1542   BILIRUBINUR NEGATIVE 06/06/2017 1542   KETONESUR NEGATIVE 06/06/2017 1542   PROTEINUR NEGATIVE 06/06/2017 1542   UROBILINOGEN 0.2 12/24/2014 0311   NITRITE NEGATIVE 06/06/2017 1542   LEUKOCYTESUR NEGATIVE 06/06/2017 1542   Sepsis Labs: @LABRCNTIP (procalcitonin:4,lacticidven:4) )No results found for this or any previous visit (from the past 240 hour(s)).   Radiological Exams on Admission: Dg Chest 2 View  Result Date: 06/06/2017 CLINICAL DATA:   Daughter stated, He is been sleeping for about 23 hours a day will go to the BR and then go back to bed. Eating but not as much as usual. Has Hx of copd,cad.ckd EXAM: CHEST  2 VIEW COMPARISON:  12/08/2016 FINDINGS: Prior median sternotomy. Pacer/AICD device. Mitral valve replacement. Midline  trachea. Moderate cardiomegaly. Atherosclerosis in the transverse aorta. No right and no definite left pleural fluid. Remote right rib trauma. Moderate pulmonary interstitial prominence and indistinctness, progressive. Left pleural calcifications. Mild right hemidiaphragm elevation. IMPRESSION: Cardiomegaly with progressive pulmonary interstitial prominence. Favored to represent mild pulmonary venous congestion, superimposed upon the sequelae of smoking/ chronic bronchitis. Aortic Atherosclerosis (ICD10-I70.0). Electronically Signed   By: Jeronimo Greaves M.D.   On: 06/06/2017 13:53    EKG: Independently reviewed. Ventricularly paced, rate 67  Assessment/Plan  Acute Combined Systolic and Diastolic Heart Failure -Patient presented with shortness of breath, fatigue, weight gain  -Last echocardiogram 04/07/2017 EF 25-30%, grade 2 diastolic dysfunction -BNP on admission 486.4 -CXR: Cardiomegaly with pulmonary edema, vascular congestion -Was recently placed on Torsemide 2 weeks ago by cardiology -Will place on IV lasix 60mg  BID and monitor intake/output, daily weights -will consult cardiology -Continue Entresto, coreg -hold torsemide and spironolactone   Permanent atrial fibrillation -S/p AVN ablation -CHADSVASC 6 -Continue Eliquis and coreg  Essential Hypertension -Continue coreg and IV lasix -monitor closely  Acute on Chronic hypoxic respiratory and dyspnea/COPD  -Likely secondary to CHF exacerbation and COPD -Oxygen saturation in the 80s -patient on chronic supplemental oxygen, 2.5L -continue supplemental oxygen to maintain saturations above 92%  Nicotine abuse -Smokes 2 ppd per patient -will place  on nicotine patch -discussed smoking cessation  Fatigue -has been ongoing for 5 weeks -per daughter, patient has been sleeping 23 hours -Will check TSH, B12, Vit D levels  Hyperlipidemia -Continue statin, fenofibrate  GERD  -Continue H2 blocker  Chronic kidney disease, stage III -Creatinine stable -Continue to monitor BMP closely as patient will be diuresing   Diabetes mellitus, type II -hold glipizide -continue ISS and CBG monitoring   Elevated Troponin -likely secondary to demand and CHF exac -continue to monitor    DVT prophylaxis: Eliquis  Code Status: Full  Family Communication: Family at bedside. Admission, patients condition and plan of care including tests being ordered have been discussed with the patient and family who indicate understanding and agree with the plan and Code Status.  Disposition Plan: Admitted. Home when stable   Consults called: Cardiology   Admission status: Inpatient. Patient currently appears to be volume overloaded and needs IV diuresis with constant lab monitoring.    Time spent: 70 minutes  Avri Paiva D.O. [name] Pager [phone]  If 7PM-7AM, please contact night-coverage [email or web address] Password [facility] 06/06/2017, 5:36 PM

## 2017-06-06 NOTE — ED Triage Notes (Signed)
Daughter stated this has been going on for 5 weeks.

## 2017-06-06 NOTE — ED Notes (Signed)
Attempted report; name and number given

## 2017-06-07 ENCOUNTER — Other Ambulatory Visit: Payer: Self-pay

## 2017-06-07 ENCOUNTER — Encounter (HOSPITAL_COMMUNITY): Payer: Medicare Other | Admitting: Cardiology

## 2017-06-07 ENCOUNTER — Telehealth (HOSPITAL_COMMUNITY): Payer: Self-pay

## 2017-06-07 ENCOUNTER — Encounter (HOSPITAL_COMMUNITY): Payer: Self-pay | Admitting: General Practice

## 2017-06-07 DIAGNOSIS — I482 Chronic atrial fibrillation: Secondary | ICD-10-CM

## 2017-06-07 DIAGNOSIS — I5023 Acute on chronic systolic (congestive) heart failure: Secondary | ICD-10-CM

## 2017-06-07 DIAGNOSIS — R748 Abnormal levels of other serum enzymes: Secondary | ICD-10-CM

## 2017-06-07 LAB — GLUCOSE, CAPILLARY
GLUCOSE-CAPILLARY: 165 mg/dL — AB (ref 65–99)
GLUCOSE-CAPILLARY: 213 mg/dL — AB (ref 65–99)
GLUCOSE-CAPILLARY: 155 mg/dL — AB (ref 65–99)
Glucose-Capillary: 194 mg/dL — ABNORMAL HIGH (ref 65–99)

## 2017-06-07 LAB — BASIC METABOLIC PANEL
ANION GAP: 10 (ref 5–15)
BUN: 23 mg/dL — ABNORMAL HIGH (ref 6–20)
CHLORIDE: 98 mmol/L — AB (ref 101–111)
CO2: 33 mmol/L — ABNORMAL HIGH (ref 22–32)
Calcium: 9.4 mg/dL (ref 8.9–10.3)
Creatinine, Ser: 1.18 mg/dL (ref 0.61–1.24)
Glucose, Bld: 154 mg/dL — ABNORMAL HIGH (ref 65–99)
POTASSIUM: 3.6 mmol/L (ref 3.5–5.1)
SODIUM: 141 mmol/L (ref 135–145)

## 2017-06-07 LAB — TROPONIN I
TROPONIN I: 0.05 ng/mL — AB (ref <0.03)
TROPONIN I: 0.05 ng/mL — AB (ref <0.03)
Troponin I: 0.06 ng/mL (ref <0.03)

## 2017-06-07 LAB — VITAMIN B12: Vitamin B-12: 754 pg/mL (ref 180–914)

## 2017-06-07 LAB — TSH: TSH: 1.293 u[IU]/mL (ref 0.350–4.500)

## 2017-06-07 MED ORDER — CARVEDILOL 6.25 MG PO TABS
6.2500 mg | ORAL_TABLET | Freq: Every day | ORAL | Status: DC
  Administered 2017-06-08 – 2017-06-11 (×4): 6.25 mg via ORAL
  Filled 2017-06-07 (×6): qty 1

## 2017-06-07 MED ORDER — SPIRONOLACTONE 12.5 MG HALF TABLET
12.5000 mg | ORAL_TABLET | Freq: Every day | ORAL | Status: DC
  Administered 2017-06-07: 12.5 mg via ORAL
  Filled 2017-06-07 (×2): qty 1

## 2017-06-07 MED ORDER — NICOTINE 21 MG/24HR TD PT24
21.0000 mg | MEDICATED_PATCH | Freq: Every day | TRANSDERMAL | Status: DC
  Administered 2017-06-07 – 2017-06-13 (×7): 21 mg via TRANSDERMAL
  Filled 2017-06-07 (×7): qty 1

## 2017-06-07 MED ORDER — POTASSIUM CHLORIDE CRYS ER 20 MEQ PO TBCR
40.0000 meq | EXTENDED_RELEASE_TABLET | Freq: Once | ORAL | Status: AC
  Administered 2017-06-07: 40 meq via ORAL
  Filled 2017-06-07: qty 2

## 2017-06-07 MED ORDER — CARVEDILOL 6.25 MG PO TABS
9.3750 mg | ORAL_TABLET | Freq: Every day | ORAL | Status: DC
  Administered 2017-06-07 – 2017-06-10 (×4): 9.375 mg via ORAL
  Filled 2017-06-07 (×3): qty 1

## 2017-06-07 MED ORDER — ORAL CARE MOUTH RINSE
15.0000 mL | Freq: Two times a day (BID) | OROMUCOSAL | Status: DC
  Administered 2017-06-07 – 2017-06-13 (×11): 15 mL via OROMUCOSAL

## 2017-06-07 MED ORDER — FUROSEMIDE 10 MG/ML IJ SOLN
80.0000 mg | Freq: Two times a day (BID) | INTRAMUSCULAR | Status: DC
  Administered 2017-06-07 – 2017-06-08 (×2): 80 mg via INTRAVENOUS
  Filled 2017-06-07 (×2): qty 8

## 2017-06-07 NOTE — Telephone Encounter (Signed)
Patient's daughter calling concerned about patient who is currently admitted to [facility]. Daughter has multiple questions and concerns regarding patient's plan of care moving forward. Will forward to Otilio Saber PA-C on inpatient rounding service with CHF team to see if he is able to reach out to her to answer these questions. [phone])  Ave Filter, RN

## 2017-06-07 NOTE — Telephone Encounter (Signed)
We have been consulted on him. We will see this afternoon.

## 2017-06-07 NOTE — Progress Notes (Signed)
Advanced Heart Failure Team Consult Note   Primary Physician: Dr. Donette Larry Primary Cardiologist: Dr Shirlee Latch   Reason for Consultation: Acute on Chronic Systolic and Diastolic Heart Failure  HPI:    [name] is seen today for evaluation of acute heart failure exacerbation at the request of Dr. Catha Gosselin.   EMIL STAHLNECKER is a 71 y.o. 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    He was seen in EP clinic on 12/18 with excess fluid (SOB, fatigue). Lasix was changed to torsemide 60 daily per Dr. Alford Highland recommendation. His weight was 185 lbs at that appointment.  He presented to the ED on 12/30 with SOB, fatigue, and weight gain (185 lbs). Pt is alone in the room currently and is a poor historian. He was difficult to arouse. He was oriented to self and place, but needed reorientation to the year of 2018, not 1918 and December, not January. He recalls urinating a lot after lasix was changed to torsemide. He does not recall worsening SOB or weight gain, but says he has been very tired. He has had a good appetite. No N/V. No CP. SOB with ambulating to bathroom sometimes. No SOB at rest. He does not weigh himself, but he does have a scale. Paramedicine and his daughter help with his medications. He continues to drink and smoke. No ICD shocks. No claudication. No fever/chills. He has a nonproductive cough, which he says is new. He is down 15 lbs from ED weight yesterday (?13 lb weight difference within a few hours). Negative 750 mls.  Pertinent admission lab work: Na 139, K 3.7, Creatinine 1.28, BNP 486.4, WBC 11.5, troponin 0.06  CXR 06/06/2017: Cardiomegaly with progressive pulmonary interstitial prominence. Favored to represent mild pulmonary venous congestion, superimposed upon  the sequelae of smoking/ chronic bronchitis.  Echo 04/07/2017 - Left ventricle: The cavity size was moderately dilated. There was   mild concentric hypertrophy. Systolic function was severely   reduced. The estimated ejection fraction was in the range of 25%   to 30%. Global hypokinesis worse in the anterolateral,   inferolateral, inferior, and inferoseptal myocardium. Features   are consistent with a pseudonormal left ventricular filling   pattern, with concomitant abnormal relaxation and increased   filling pressure (grade 2 diastolic dysfunction). Doppler   parameters are consistent with high ventricular filling pressure. - Aortic valve: Transvalvular velocity was within the normal range.   There was no stenosis. There was no regurgitation. - Mitral valve: A bioprosthesis was present. Transvalvular velocity   was within the normal range. There was no evidence for stenosis.   There was no regurgitation. - Left atrium: The atrium was moderately dilated. - Right ventricle: The cavity size was normal. Wall thickness was   normal. Systolic function was moderately reduced. - Tricuspid valve: There was mild regurgitation. - Pulmonary arteries: Systolic pressure was within the normal   range. PA peak pressure: 27 mm Hg (S). - Pericardium, extracardiac: A trivial pericardial effusion was   identified.  LHC in January 2008: Patent LCx stent and old total occlusion of RCA. LHC (3/16) with moderate distal LM stenosis (IVUS 6.9 mm2), 80% ostial LCx, total occlusion RCA with collaterals. Ostial LCx not ideal for PCI, managed medically.   Review of Systems: [y] = yes, [ ]  =  no   General: Weight gain [place]ove.[place]ienne ]; Weight loss [ ] ; Anorexia [ ] ; Fatigue [place].Etienne ]; Fever [ ] ; Chills [ ] ; Weakness [ ]   Cardiac: Chest pain/pressure [ ] ; Resting SOB [ ] ; Exertional SOB Cove.Etienne ]; Orthopnea [ ] ; Pedal Edema [ ] ; Palpitations [ ] ; Syncope [ ] ; Presyncope [ ] ; Paroxysmal nocturnal dyspnea[ ]   Pulmonary: Cough Cove.Etienne ];  Wheezing[ ] ; Hemoptysis[ ] ; Sputum [ ] ; Snoring [ ]   GI: Vomiting[ ] ; Dysphagia[ ] ; Melena[ ] ; Hematochezia [ ] ; Heartburn[ ] ; Abdominal pain [ ] ; Constipation [ ] ; Diarrhea [ ] ; BRBPR [ ]   GU: Hematuria[ ] ; Dysuria [ ] ; Nocturia[ ]   Vascular: Pain in legs with walking [ ] ; Pain in feet with lying flat [ ] ; Non-healing sores [ ] ; Stroke [ ] ; TIA [ ] ; Slurred speech [ ] ;  Neuro: Headaches[ ] ; Vertigo[ ] ; Seizures[ ] ; Paresthesias[ ] ;Blurred vision [ ] ; Diplopia [ ] ; Vision changes [ ]   Ortho/Skin: Arthritis [ ] ; Joint pain [ ] ; Muscle pain [ ] ; Joint swelling [ ] ; Back Pain [ ] ; Rash [ ]   Psych: Depression[ ] ; Anxiety[ ]   Heme: Bleeding problems [ ] ; Clotting disorders [ ] ; Anemia [ ]   Endocrine: Diabetes Cove.Etienne ]; Thyroid dysfunction[ ]   Home Medications Prior to Admission medications   Medication Sig Start Date End Date Taking? Authorizing Provider  apixaban (ELIQUIS) 5 MG TABS tablet Take 1 tablet (5 mg total) by mouth 2 (two) times daily. 02/12/17  Yes Little Ishikawa, NP  atorvastatin (LIPITOR) 40 MG tablet Take 1 tablet (40 mg total) by mouth daily. 05/13/16  Yes Clegg, Amy D, NP  carvedilol (COREG) 6.25 MG tablet Take 6.25 mg (1 tab) in am and 9.375 mg (1.5 tabs) in pm Patient taking differently: 6.25 mg 2 (two) times daily with a meal. Take 6.25 mg (1 tab) in am and 9.375 mg (1.5 tabs) in pm 04/14/17  Yes Clegg, Amy D, NP  fenofibrate (TRICOR) 48 MG tablet TAKE 1 TABLET BY MOUTH EVERY DAY 02/16/17  Yes [name], MD  Fexofenadine HCl (ALLERGY 24-HR PO) Take 1 tablet by mouth daily.   Yes [provider]  glipiZIDE (GLUCOTROL) 5 MG tablet Take 5 mg daily before breakfast by mouth.   Yes [provider]  Multiple Vitamin (MULTIVITAMIN WITH MINERALS) TABS tablet Take 1 tablet by mouth daily. 05/24/15  Yes Osvaldo Shipper, MD  OXYGEN Inhale 2.5 L into the lungs.   Yes [provider]  ranitidine (ZANTAC) 150 MG tablet Take 150 mg by mouth daily as needed for heartburn.    Yes [provider]  sacubitril-valsartan (ENTRESTO) 49-51 MG Take 1 tablet by mouth 2 (two) times daily. 02/12/17  Yes Little Ishikawa, NP  spironolactone (ALDACTONE) 25 MG tablet Take 0.5 tablets (12.5 mg total) by mouth daily. 01/29/17  Yes [name], MD  thiamine 100 MG tablet Take 1 tablet (100 mg total) by mouth daily. 05/24/15  Yes Osvaldo Shipper, MD  torsemide (DEMADEX) 20 MG tablet Take 3 tablets (60 mg total) by mouth daily. 05/25/17  Yes Seiler, Amber K, NP  nitroGLYCERIN (NITROSTAT) 0.4 MG SL tablet Place 1 tablet (0.4 mg total) under the tongue every 5 (five) minutes as needed for chest pain. 01/06/15   Allayne Butcher, PA-C    Past Medical History: Past Medical History:  Diagnosis Date  . AICD (automatic cardioverter/defibrillator) present   . Alcohol abuse   . Anxiety   . Bipolar disorder (HCC)   .  Biventricular automatic implantable cardioverter defibrillator in situ    a. 01/2014 s/p MDT [id number] Viva XT CRT-D (ser # [id number]).  . CAD (coronary artery disease)    a. s/p prior PCI/stenting of the LCX;  b. 08/2014 MV: EF 20%, large septal, apical, and inferior infarct from apex to base, no ischemia;  c. 08/2014 NSTEMI/Cath: LM mod distal dzs extending into ostial LCX (80%), LAD tortuous, RI nl, OM mod dzs, RCA 100 CTO with R->R and L->R collats-->Med Rx.  . Chronic atrial fibrillation (HCC)    a. CHA2DS2VASc = 6->eliquis;  b. S/P AVN RFCA an BiV ICD placement.  . Chronic respiratory failure (HCC)   . Chronic systolic congestive heart failure (HCC)    a. 01/2014 Echo: EF 25-30%, mod conc LVH, mod dil LA.  . CKD (chronic kidney disease)    a. suspect stage II-III based on historical labs.  . Complete heart block (HCC)    a. In setting of prior AV nodal ablation r/t afib-->BiV ICD (01/2014).  . COPD (chronic obstructive pulmonary disease) (HCC)   . CVA (cerebral vascular accident) (HCC)    a. Multiple prior embolic strokes.  . Dementia   . Depression   . DVT  (deep venous thrombosis) (HCC)   . Dyslipidemia   . Hyperglycemia   . Hypertension   . Mitral valve disease    a. remote mitral replacement with Bjork Shiley valve;  b. 06/2006 Redo MVR with tissue valve.  . Mixed Ischemic and Nonischemic Cardiomyopathy    a. 8/.2015 Echo: Ef 25-30%;  b. 01/2014 s/p MDT [id number] Talbot Grumbling XT CRT-D (ser # [id number]).  . On home oxygen therapy    "3L at night" (11/23/2015)  . Ventral hernia     Past Surgical History: Past Surgical History:  Procedure Laterality Date  . AV NODE ABLATION  2007  . CARDIAC CATHETERIZATION  06/2006   Mild ostial L main stenosis, CFX stent patent, RCA occluded (old)  . CORONARY ARTERY BYPASS GRAFT  2008  . CYSTOSCOPY W/ URETERAL STENT PLACEMENT  07/12/2011   Procedure: CYSTOSCOPY WITH RETROGRADE PYELOGRAM/URETERAL STENT PLACEMENT;  Surgeon: Sebastian Ache, MD;  Location[facility] OR;  Service: Urology;  Laterality: Left;  . HERNIA REPAIR    . ICD GENERATOR CHANGE  02/01/2014   Gen change to: Medtronic VIVA pulse generator, serial number [id number]  . INSERT / REPLACE / REMOVE PACEMAKER    . LEFT HEART CATHETERIZATION WITH CORONARY ANGIOGRAM N/A 08/23/2014   Procedure: LEFT HEART CATHETERIZATION WITH CORONARY ANGIOGRAM;  Surgeon: Corky Crafts, MD;  Location: [facility] CATH LAB;  Service: Cardiovascular;  Laterality: N/A;  . MITRAL VALVE REPLACEMENT     remote [facility] valve 8469 with redo tissue valve 06/2006  . PACEMAKER GENERATOR CHANGE N/A 02/01/2014   Procedure: PACEMAKER GENERATOR CHANGE;  Surgeon: Duke Salvia, MD;  Location: [facility] CATH LAB;  Service: Cardiovascular;  Laterality: N/A;  . PACEMAKER PLACEMENT  2006   Changed to CRT-D in 2009    Family History: Family History  Problem Relation Age of Onset  . Alzheimer's disease Mother   . Heart attack Father   . Heart disease Father   . Heart attack Son   . Varicose Veins Son   . Deep vein thrombosis Son   . Heart disease Son   . Stroke Unknown   . Heart disease Unknown   .  Heart disease Brother     Social History: Social History   Socioeconomic History  . Marital status: Divorced  Spouse name: None  . Number of children: None  . Years of education: None  . Highest education level: None  Social Needs  . Financial resource strain: None  . Food insecurity - worry: None  . Food insecurity - inability: None  . Transportation needs - medical: None  . Transportation needs - non-medical: None  Occupational History  . Occupation: Retired Therapist, [employer]  Tobacco Use  . Smoking status: Current Every Day Smoker    Packs/day: 2.00    Years: 42.00    Pack years: 84.00    Types: Cigarettes  . Smokeless tobacco: Never Used  Substance and Sexual Activity  . Alcohol use: Yes    Alcohol/week: 0.0 oz    Comment: 11/22/2015 "6 pack of beer/month"  . Drug use: No  . Sexual activity: Not Currently  Other Topics Concern  . None  Social History Narrative   ** Merged History Encounter **        Allergies:  Allergies  Allergen Reactions  . Warfarin Other (See Comments)    Non compliance and ETOH abuse    Objective:    Vital Signs:   Temp:  [97.9 F (36.6 C)-98.4 F (36.9 C)] 97.9 F (36.6 C) (12/31 1423) Pulse Rate:  [59-80] 60 (12/31 0500) Resp:  [18-31] 18 (12/31 0500) BP: (103-150)/(55-80) 103/55 (12/31 1423) SpO2:  [85 %-96 %] 93 % (12/31 1423) Weight:  [168 lb 14 oz (76.6 kg)-172 lb 12.8 oz (78.4 kg)] 170 lb 6.7 oz (77.3 kg) (12/31 0500) Last BM Date: (pt does not recall)  Weight change: Filed Weights   06/06/17 1546 06/06/17 1930 06/07/17 0500  Weight: 172 lb 12.8 oz (78.4 kg) 168 lb 14 oz (76.6 kg) 170 lb 6.7 oz (77.3 kg)    Intake/Output:   Intake/Output Summary (Last 24 hours) at 06/07/2017 1502 Last data filed at 06/07/2017 1451 Gross per 24 hour  Intake -  Output 1000 ml  Net -1000 ml      Physical Exam    General:  Appears chronically ill. No resp difficulty HEENT: normal Neck: supple. JVP 8-9. Carotids 2+ bilat; no  bruits. No lymphadenopathy or thyromegaly appreciated. Cor: PMI nondisplaced. Regular rate & rhythm. No rubs, gallops or murmurs. Lungs: distant, diminished, fine crackles in LLL Abdomen: obese, soft, nontender, + distended. No hepatosplenomegaly. No bruits or masses. Good bowel sounds. Extremities: no cyanosis, clubbing, rash, edema, warm Neuro: alert & orientedx2, needing reorientation to time, cranial nerves grossly intact. moves all 4 extremities w/o difficulty. Affect pleasant. Mumbles.   Telemetry   V-paced 50-60's with wide QRS, rare PVC's. Personally reviewed.  EKG    EKG shows Vpacing with QTc of 519 and QRS of .16. Personally reviewed.  Labs   Basic Metabolic Panel: Recent Labs  Lab 06/06/17 1037 06/07/17 0415  NA 139 141  K 3.7 3.6  CL 95* 98*  CO2 32 33*  GLUCOSE 231* 154*  BUN 22* 23*  CREATININE 1.28* 1.18  CALCIUM 9.8 9.4    Liver Function Tests: No results for input(s): AST, ALT, ALKPHOS, BILITOT, PROT, ALBUMIN in the last 168 hours. No results for input(s): LIPASE, AMYLASE in the last 168 hours. No results for input(s): AMMONIA in the last 168 hours.  CBC: Recent Labs  Lab 06/06/17 1037  WBC 11.5*  HGB 12.9*  HCT 41.6  MCV 93.5  PLT 229    Cardiac Enzymes: Recent Labs  Lab 06/06/17 1613 06/07/17 0742 06/07/17 1312  TROPONINI 0.06* 0.06* 0.05*    BNP: BNP (  last 3 results) Recent Labs    01/27/17 1046 02/01/17 1054 06/06/17 1037  BNP 136.7* 102.6* 486.4*    ProBNP (last 3 results) Recent Labs    05/25/17 1152  PROBNP 1,108*     CBG: Recent Labs  Lab 06/06/17 2106 06/07/17 0740 06/07/17 1147  GLUCAP 157* 165* 213*    Coagulation Studies: No results for input(s): LABPROT, INR in the last 72 hours.   Imaging   No results found.   Medications:     Current Medications: . apixaban  5 mg Oral BID  . atorvastatin  40 mg Oral Daily  . carvedilol  6.25 mg Oral Q breakfast  . carvedilol  9.375 mg Oral Q supper    . famotidine  20 mg Oral BID  . fenofibrate  54 mg Oral Daily  . furosemide  60 mg Intravenous Q12H  . insulin aspart  0-5 Units Subcutaneous QHS  . insulin aspart  0-9 Units Subcutaneous TID WC  . loratadine  10 mg Oral Daily  . mouth rinse  15 mL Mouth Rinse BID  . multivitamin with minerals  1 tablet Oral Daily  . nicotine  21 mg Transdermal Daily  . sacubitril-valsartan  1 tablet Oral BID  . sodium chloride flush  3 mL Intravenous Q12H  . spironolactone  12.5 mg Oral Daily  . thiamine  100 mg Oral Daily    Infusions: . sodium chloride        Patient Profile   REINER LOEWEN is a 71 y.o. 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    He was admitted on 12/30 with acute on chronic systolic and diastolic heart failure exacerbation.   Assessment/Plan   1. Acute on Chronic Systolic and Diastolic Heart Failure - mixed Ischemic vs. ETOH CM. S/p Medtronic CRT-D Echo 10/31EF 25-30% with grade II DD. NYHA III - Volume status mildly elevated. CVP 8-9. - Continue IV lasix 60 BID today. K 3.6. Supplement K. - Continue entresto 49/51. Has not tolerated higher doses due to fatigue in past. - Continue coreg 6.25 mg am and 9.375 in pm. Adjust hold parameters. - Restart home spiro 12.5 daily. - Weight 170 this am. Weight in clinic was 185 on 12/18 and previously low 170's in clinic. Family reports dry weight of 155 lbs, but likely not accurate. Weights: 185 > 172 > 170  2. A Fib: Chronic - s/p AV nodal ablation - HR 50-60's - Continue eliquis  3. CAD - LHC in 1/08 with patent LCx stent and old total occlusion of RCA. LHC (3/16) with moderate distal LM stenosis (IVUS 6.9 mm2), 80% ostial LCx, total occlusion RCA with collaterals.  Ostial LCx not ideal for PCI, managed  medically.  - No s/s ischemia.  - Positive troponin 0.06 > 0.06. Likely related to fluid overload. Continue to trend. - Continue atorvastatin and coreg. - No ASA. On eliquis for a fib.  4. COPD on home O2 - Smokes 2 ppd. No intentions of quitting. - Says he takes off O2 to smoke. - Wears 2.5 L at home, currently on 2 L with O2 sats 89-95% - Nonproductive cough. No fever/chills. CXR negative for infection.  5. Fatigue - TSH and B12 normal, vitamin D pending - Likely related to CHF and lifestyle  6. Mitral  Valve Replacement - Bjordk-Shiley 1983, redo tissue mitral valve 2008  7. CKD, stage III - Creatinine stable at 1.18 - Monitor daily BMP  8. Tobacco abuse - Continues to smoke 2 ppd. - Encouraged cessation - Continue nicotine patch  9. ETOH abuse - Monitor for withdrawal s/s - Continue thiamine.  - Continues to drink alcohol (moonshine and beer) - Encouarged cessation  10. PAD - Denies claudication symptoms.  11. HTN - BP 90-140's/50-70's - Continue entresto, spiro, and coreg as above.  12. DM - On SSI  Medication concerns reviewed with patient and pharmacy team. Barriers identified: Ian Malkin with paramedicine sees him currently. Ian Malkin and pt's daughter manage his medications. Unclear if he weighs himself at home (stated no, then stated yes).  Length of Stay: 1  Alford Highland, NP  06/07/2017, 3:02 PM  Advanced Heart Failure Team Pager [phone] (M-F; 7a - 4p)  Please contact [facility] for night-coverage after hours (4p -7a ) and weekends on [email or web address]  Patient seen with PA, agree with the above note.  I performed an independent exam and updated the above note with my thoughts.  1. 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  MDT CRT-D.  Still an active drinker. He was admitted with increased dyspnea, poor exercise tolerance. On exam, he is volume overloaded.  - Lasix 80 mg IV bid.  - Restarted spironolactone 12.5  daily.  - Continue home Coreg and Entresto.  - Needs ETOH cessation.  2. CAD: No chest pain.  Mild troponin increase with no trend, likely demand ischemia related to volume overload. Last cath in 2008 with medical management.  - This admission would consider right/left heart catheterization given gradual clinical worsening over the last few months. Will diurese first.  - Continue statin, no ASA given Eliquis use.  3. Atrial fibrillation: Chronic.  Had AV nodal ablation, now BiV pacing. Continue Eliquis (will hold later this week for possible cath).  4. COPD: Still active smoker, 2 ppd.  On home oxygen.  - Continue oxygen per [place] - Needs to quit smoking.  5. ETOH: Ongoing abuse.  Needs to stop.  6. MV replacement: Bioprosthetic.  Looked ok on last echo.   Marca Ancona 06/07/2017 3:54 PM

## 2017-06-07 NOTE — Progress Notes (Signed)
PROGRESS NOTE    [name]  TRZ:[id number] DOB: [birth year] DOA: 06/06/2017 PCP: Georgann Housekeeper, MD   Chief Complaint  Patient presents with  . Fatigue  . Weakness    Brief Narrative:  71 y.o. male with a medical history of CHF, atrial fibrillation, coronary disease, who presents to the emergency department with complaints of increasing shortness of breath, fatigue, weight gain. Admitted for CHF exacerbation. Cardiology consulted.   Assessment & Plan   Acute Combined Systolic and Diastolic Heart Failure -Patient presented with shortness of breath, fatigue, weight gain  -Last echocardiogram 04/07/2017 EF 25-30%, grade 2 diastolic dysfunction -BNP on admission 486.4 -CXR: Cardiomegaly with pulmonary edema, vascular congestion -Was recently placed on Torsemide 2 weeks ago by cardiology -Continue IV lasix 60mg  BID and monitor intake/output, daily weights -Continue Entresto, coreg -hold torsemide and spironolactone  -Discussed with Dr. Shirlee Latch, patient has been nonadherent with meds and continues to drink and smoke  Permanent atrial fibrillation -S/p AVN ablation -CHADSVASC 6 -Continue Eliquis and coreg  Essential Hypertension -Continue coreg and IV lasix -monitor closely  Acute on Chronic hypoxic respiratory and dyspnea/COPD  -Likely secondary to CHF exacerbation and COPD -Oxygen saturation in the 80s -patient on chronic supplemental oxygen, 2.5L -continue supplemental oxygen to maintain saturations above 92%  Nicotine abuse -Smokes 2 ppd per patient -Continue nicotine patch -discussed smoking cessation  Fatigue -has been ongoing for 5 weeks -suspect secondary to CHF -per daughter, patient has been sleeping 23 hours -TSH 1.293 (WNL), Vit B12 754 -Vitamin D pending  Hyperlipidemia -Continue statin, fenofibrate  GERD  -Continue H2 blocker  Chronic kidney disease, stage III -Creatinine stable, currently 1.18 -Continue to monitor BMP closely as  patient will be diuresing   Alcohol abuse -continue thiamine -continues to drink moonshine and beer -discussed cessation   Diabetes mellitus, type II -hold glipizide -continue ISS and CBG monitoring   Elevated Troponin -likely secondary to demand and CHF exac -troponin being cycled, and currently flat, 0.06 -continue to monitor    DVT Prophylaxis  Eliquis  Code Status: Full  Family Communication: none at bedside  Disposition Plan: Admitted. Continue treatment of CHF  Consultants Cardiology  Procedures  None  Antibiotics   Anti-infectives (From admission, onward)   None      Subjective:   [name] seen and examined today.  Feels better this morning. Feels breathing has mildly improved. Denies chest pain, abdominal pain, N/V/D/C.   Objective:   Vitals:   06/06/17 1830 06/06/17 1900 06/06/17 1930 06/07/17 0500  BP: 127/64 (!) 147/69 139/79 (!) 118/57  Pulse: 61 60 60 60  Resp: (!) 25 (!) 27 19 18   Temp:   98.2 F (36.8 C) 98.4 F (36.9 C)  TempSrc:   Oral Oral  SpO2: 92% 93% 95% 93%  Weight:   76.6 kg (168 lb 14 oz) 77.3 kg (170 lb 6.7 oz)  Height:   5\' 7"  (1.702 m)     Intake/Output Summary (Last 24 hours) at 06/07/2017 1054 Last data filed at 06/06/2017 2335 Gross per 24 hour  Intake -  Output 750 ml  Net -750 ml   Filed Weights   06/06/17 1546 06/06/17 1930 06/07/17 0500  Weight: 78.4 kg (172 lb 12.8 oz) 76.6 kg (168 lb 14 oz) 77.3 kg (170 lb 6.7 oz)    Exam  General: Well developed, chronically ill appearing, NAD  HEENT: NCAT, mucous membranes moist.   Cardiovascular: S1 S2 auscultated, no rubs, murmurs or gallops. Regular rate and rhythm.  Respiratory: Clear to auscultation bilaterally with equal chest rise  Abdomen: Soft, nontender, nondistended, + bowel sounds  Extremities: warm dry without cyanosis clubbing or edema  Neuro: AAOx3, nonfocal  Psych: Appropriate mood and affect   Data Reviewed: I have personally reviewed  following labs and imaging studies  CBC: Recent Labs  Lab 06/06/17 1037  WBC 11.5*  HGB 12.9*  HCT 41.6  MCV 93.5  PLT 229   Basic Metabolic Panel: Recent Labs  Lab 06/06/17 1037 06/07/17 0415  NA 139 141  K 3.7 3.6  CL 95* 98*  CO2 32 33*  GLUCOSE 231* 154*  BUN 22* 23*  CREATININE 1.28* 1.18  CALCIUM 9.8 9.4   GFR: Estimated Creatinine Clearance: 55.2 mL/min (by C-G formula based on SCr of 1.18 mg/dL). Liver Function Tests: No results for input(s): AST, ALT, ALKPHOS, BILITOT, PROT, ALBUMIN in the last 168 hours. No results for input(s): LIPASE, AMYLASE in the last 168 hours. No results for input(s): AMMONIA in the last 168 hours. Coagulation Profile: No results for input(s): INR, PROTIME in the last 168 hours. Cardiac Enzymes: Recent Labs  Lab 06/06/17 1613 06/07/17 0742  TROPONINI 0.06* 0.06*   BNP (last 3 results) Recent Labs    05/25/17 1152  PROBNP 1,108*   HbA1C: No results for input(s): HGBA1C in the last 72 hours. CBG: Recent Labs  Lab 06/06/17 2106 06/07/17 0740  GLUCAP 157* 165*   Lipid Profile: No results for input(s): CHOL, HDL, LDLCALC, TRIG, CHOLHDL, LDLDIRECT in the last 72 hours. Thyroid Function Tests: Recent Labs    06/07/17 0742  TSH 1.293   Anemia Panel: Recent Labs    06/07/17 0742  VITAMINB12 754   Urine analysis:    Component Value Date/Time   COLORURINE YELLOW 06/06/2017 1542   APPEARANCEUR CLEAR 06/06/2017 1542   LABSPEC 1.015 06/06/2017 1542   PHURINE 7.0 06/06/2017 1542   GLUCOSEU >=500 (A) 06/06/2017 1542   HGBUR NEGATIVE 06/06/2017 1542   BILIRUBINUR NEGATIVE 06/06/2017 1542   KETONESUR NEGATIVE 06/06/2017 1542   PROTEINUR NEGATIVE 06/06/2017 1542   UROBILINOGEN 0.2 12/24/2014 0311   NITRITE NEGATIVE 06/06/2017 1542   LEUKOCYTESUR NEGATIVE 06/06/2017 1542   Sepsis Labs: @LABRCNTIP (procalcitonin:4,lacticidven:4)  )No results found for this or any previous visit (from the past 240 hour(s)).     Radiology Studies: Dg Chest 2 View  Result Date: 06/06/2017 CLINICAL DATA:  Daughter stated, He is been sleeping for about 23 hours a day will go to the BR and then go back to bed. Eating but not as much as usual. Has Hx of copd,cad.ckd EXAM: CHEST  2 VIEW COMPARISON:  12/08/2016 FINDINGS: Prior median sternotomy. Pacer/AICD device. Mitral valve replacement. Midline trachea. Moderate cardiomegaly. Atherosclerosis in the transverse aorta. No right and no definite left pleural fluid. Remote right rib trauma. Moderate pulmonary interstitial prominence and indistinctness, progressive. Left pleural calcifications. Mild right hemidiaphragm elevation. IMPRESSION: Cardiomegaly with progressive pulmonary interstitial prominence. Favored to represent mild pulmonary venous congestion, superimposed upon the sequelae of smoking/ chronic bronchitis. Aortic Atherosclerosis (ICD10-I70.0). Electronically Signed   By: Jeronimo Greaves M.D.   On: 06/06/2017 13:53     Scheduled Meds: . apixaban  5 mg Oral BID  . atorvastatin  40 mg Oral Daily  . carvedilol  6.25 mg Oral Q breakfast  . carvedilol  9.375 mg Oral Q supper  . famotidine  20 mg Oral BID  . fenofibrate  54 mg Oral Daily  . furosemide  60 mg Intravenous Q12H  . insulin  aspart  0-5 Units Subcutaneous QHS  . insulin aspart  0-9 Units Subcutaneous TID WC  . loratadine  10 mg Oral Daily  . mouth rinse  15 mL Mouth Rinse BID  . multivitamin with minerals  1 tablet Oral Daily  . nicotine  21 mg Transdermal Daily  . sacubitril-valsartan  1 tablet Oral BID  . sodium chloride flush  3 mL Intravenous Q12H  . spironolactone  12.5 mg Oral Daily  . thiamine  100 mg Oral Daily   Continuous Infusions: . sodium chloride       LOS: 1 day   Time Spent in minutes   45 minutes  Hope Holst D.O. on 06/07/2017 at 10:54 AM  Between 7am to 7pm - Pager - [phone]  After 7pm go to [email or web address] - password [email or web address]  And look for the night coverage person  covering for me after hours  [facility] Office  [phone]

## 2017-06-08 LAB — CBC
HCT: 39 % (ref 39.0–52.0)
Hemoglobin: 11.8 g/dL — ABNORMAL LOW (ref 13.0–17.0)
MCH: 29.1 pg (ref 26.0–34.0)
MCHC: 30.3 g/dL (ref 30.0–36.0)
MCV: 96.1 fL (ref 78.0–100.0)
PLATELETS: 199 10*3/uL (ref 150–400)
RBC: 4.06 MIL/uL — AB (ref 4.22–5.81)
RDW: 14.5 % (ref 11.5–15.5)
WBC: 10.1 10*3/uL (ref 4.0–10.5)

## 2017-06-08 LAB — VITAMIN D 25 HYDROXY (VIT D DEFICIENCY, FRACTURES): Vit D, 25-Hydroxy: 31.7 ng/mL (ref 30.0–100.0)

## 2017-06-08 LAB — BASIC METABOLIC PANEL
ANION GAP: 6 (ref 5–15)
BUN: 41 mg/dL — ABNORMAL HIGH (ref 6–20)
CALCIUM: 8.9 mg/dL (ref 8.9–10.3)
CO2: 34 mmol/L — AB (ref 22–32)
CREATININE: 2.03 mg/dL — AB (ref 0.61–1.24)
Chloride: 98 mmol/L — ABNORMAL LOW (ref 101–111)
GFR calc non Af Amer: 32 mL/min — ABNORMAL LOW (ref >60)
GFR, EST AFRICAN AMERICAN: 37 mL/min — AB (ref >60)
Glucose, Bld: 162 mg/dL — ABNORMAL HIGH (ref 65–99)
Potassium: 4.6 mmol/L (ref 3.5–5.1)
SODIUM: 138 mmol/L (ref 135–145)

## 2017-06-08 LAB — GLUCOSE, CAPILLARY
GLUCOSE-CAPILLARY: 175 mg/dL — AB (ref 65–99)
GLUCOSE-CAPILLARY: 189 mg/dL — AB (ref 65–99)
GLUCOSE-CAPILLARY: 186 mg/dL — AB (ref 65–99)
Glucose-Capillary: 122 mg/dL — ABNORMAL HIGH (ref 65–99)

## 2017-06-08 MED ORDER — SODIUM CHLORIDE 0.9% FLUSH: 3.0000 mL | INTRAVENOUS | Status: DC | PRN

## 2017-06-08 MED ORDER — APIXABAN 5 MG PO TABS
5.0000 mg | ORAL_TABLET | Freq: Once | ORAL | Status: AC
  Administered 2017-06-08: 5 mg via ORAL
  Filled 2017-06-08: qty 1

## 2017-06-08 MED ORDER — SODIUM CHLORIDE 0.9% FLUSH
3.0000 mL | Freq: Two times a day (BID) | INTRAVENOUS | Status: DC
  Administered 2017-06-08: 3 mL via INTRAVENOUS

## 2017-06-08 MED ORDER — ASPIRIN 81 MG PO CHEW
81.0000 mg | CHEWABLE_TABLET | ORAL | Status: AC
  Administered 2017-06-09: 81 mg via ORAL
  Filled 2017-06-08: qty 1

## 2017-06-08 MED ORDER — SODIUM CHLORIDE 0.9 % IV SOLN
INTRAVENOUS | Status: DC
  Administered 2017-06-09: 07:00:00 via INTRAVENOUS

## 2017-06-08 MED ORDER — SODIUM CHLORIDE 0.9 % IV SOLN: 250.0000 mL | INTRAVENOUS | Status: DC | PRN

## 2017-06-08 NOTE — Progress Notes (Addendum)
PROGRESS NOTE    [name]  ZOX:[id number] DOB: [birth year] DOA: 06/06/2017 PCP: Georgann Housekeeper, MD   Chief Complaint  Patient presents with  . Fatigue  . Weakness    Brief Narrative:  72 y.o. male with a medical history of CHF, atrial fibrillation, coronary disease, who presents to the emergency department with complaints of increasing shortness of breath, fatigue, weight gain. Admitted for CHF exacerbation. Cardiology consulted.   Assessment & Plan   Acute Combined Systolic and Diastolic Heart Failure -Patient presented with shortness of breath, fatigue, weight gain  -Last echocardiogram 04/07/2017 EF 25-30%, grade 2 diastolic dysfunction -BNP on admission 486.4 -CXR: Cardiomegaly with pulmonary edema, vascular congestion -Was recently placed on Torsemide 2 weeks ago by cardiology -IV lasix increased to 80mg  BID -Continue monitor intake/output, daily weights -Continue Entresto, coreg -restarted spironolactone -hold torsemide -Discussed with Dr. Shirlee Latch, patient has been nonadherent with meds and continues to drink and smoke -Cardiology may consider left/right catheterization this admission  Addendum: given mild rise in creatinine, diuretics and entresto currently held by cardiology  Permanent atrial fibrillation -S/p AVN ablation -CHADSVASC 6 -Continue Eliquis and coreg  Essential Hypertension -Continue coreg, sprionolactone, and IV lasix -monitor closely  Acute on Chronic hypoxic respiratory failure and dyspnea/COPD  -Likely secondary to CHF exacerbation and COPD -Oxygen saturation in the 80s -patient on chronic supplemental oxygen, 2.5L -continue supplemental oxygen to maintain saturations above 92%  Nicotine abuse -Smokes 2 ppd per patient -Continue nicotine patch -discussed smoking cessation  Fatigue -has been ongoing for 5 weeks -suspect secondary to CHF -per daughter, patient has been sleeping 23 hours -TSH 1.293 (WNL), Vit B12 754 -Vitamin D  pending  Hyperlipidemia -Continue statin, fenofibrate  GERD  -Continue H2 blocker  AKI on Chronic kidney disease, stage III -Creatinine bumped to 2.03 today- still GFR in stage III -Continue to monitor BMP closely as patient will be diuresing   Alcohol abuse -continue thiamine -continues to drink moonshine and beer- admits to drinking 8 beers per day -discussed cessation   Diabetes mellitus, type II -hold glipizide -continue ISS and CBG monitoring   Elevated Troponin -likely secondary to demand and CHF exac -troponin being cycled, and currently flat, 0.06, 0.05 -as above, cardiology may consider cath this admission -continue to monitor    DVT Prophylaxis  Eliquis  Code Status: Full  Family Communication: none at bedside  Disposition Plan: Admitted. Continue treatment of CHF  Consultants Cardiology  Procedures  None  Antibiotics   Anti-infectives (From admission, onward)   None      Subjective:   Harden Bramer seen and examined today.  Feels breathing has improved this morning. Still complains of being tired. Denies chest pain, abdominal pain, N/V/D/C.   Objective:   Vitals:   06/07/17 2000 06/07/17 2118 06/08/17 0100 06/08/17 0614  BP: (!) 102/59 106/65 (!) 110/50 120/73  Pulse: (!) 59 60 (!) 59 78  Resp:  16  18  Temp:  98.4 F (36.9 C)  97.6 F (36.4 C)  TempSrc:  Oral  Oral  SpO2: 93% 93% 95% 94%  Weight:    77.4 kg (170 lb 9.6 oz)  Height:        Intake/Output Summary (Last 24 hours) at 06/08/2017 0802 Last data filed at 06/08/2017 0619 Gross per 24 hour  Intake 720 ml  Output 925 ml  Net -205 ml   Filed Weights   06/06/17 1930 06/07/17 0500 06/08/17 0614  Weight: 76.6 kg (168 lb 14 oz) 77.3 kg (170 lb  6.7 oz) 77.4 kg (170 lb 9.6 oz)   Exam  General: Well developed, Chronically ill appearing, NAD  HEENT: NCAT, mucous membranes moist.   Neck: Supple, + JVD, no masses  Cardiovascular: S1 S2 auscultated, RRR, no  murmurs  Respiratory: Diminished with fine crackles  Abdomen: Soft, nontender, +distended, + bowel sounds, ventral hernia  Extremities: warm dry without cyanosis clubbing or edema  Neuro: AAOx3, nonfocal  Psych: Normal affect and demeanor  Data Reviewed: I have personally reviewed following labs and imaging studies  CBC: Recent Labs  Lab 06/06/17 1037 06/08/17 0331  WBC 11.5* 10.1  HGB 12.9* 11.8*  HCT 41.6 39.0  MCV 93.5 96.1  PLT 229 199   Basic Metabolic Panel: Recent Labs  Lab 06/06/17 1037 06/07/17 0415 06/08/17 0331  NA 139 141 138  K 3.7 3.6 4.6  CL 95* 98* 98*  CO2 32 33* 34*  GLUCOSE 231* 154* 162*  BUN 22* 23* 41*  CREATININE 1.28* 1.18 2.03*  CALCIUM 9.8 9.4 8.9   GFR: Estimated Creatinine Clearance: 32.1 mL/min (A) (by C-G formula based on SCr of 2.03 mg/dL (H)). Liver Function Tests: No results for input(s): AST, ALT, ALKPHOS, BILITOT, PROT, ALBUMIN in the last 168 hours. No results for input(s): LIPASE, AMYLASE in the last 168 hours. No results for input(s): AMMONIA in the last 168 hours. Coagulation Profile: No results for input(s): INR, PROTIME in the last 168 hours. Cardiac Enzymes: Recent Labs  Lab 06/06/17 1613 06/07/17 0742 06/07/17 1312 06/07/17 1914  TROPONINI 0.06* 0.06* 0.05* 0.05*   BNP (last 3 results) Recent Labs    05/25/17 1152  PROBNP 1,108*   HbA1C: No results for input(s): HGBA1C in the last 72 hours. CBG: Recent Labs  Lab 06/07/17 0740 06/07/17 1147 06/07/17 1644 06/07/17 2116 06/08/17 0732  GLUCAP 165* 213* 155* 194* 175*   Lipid Profile: No results for input(s): CHOL, HDL, LDLCALC, TRIG, CHOLHDL, LDLDIRECT in the last 72 hours. Thyroid Function Tests: Recent Labs    06/07/17 0742  TSH 1.293   Anemia Panel: Recent Labs    06/07/17 0742  VITAMINB12 754   Urine analysis:    Component Value Date/Time   COLORURINE YELLOW 06/06/2017 1542   APPEARANCEUR CLEAR 06/06/2017 1542   LABSPEC 1.015  06/06/2017 1542   PHURINE 7.0 06/06/2017 1542   GLUCOSEU >=500 (A) 06/06/2017 1542   HGBUR NEGATIVE 06/06/2017 1542   BILIRUBINUR NEGATIVE 06/06/2017 1542   KETONESUR NEGATIVE 06/06/2017 1542   PROTEINUR NEGATIVE 06/06/2017 1542   UROBILINOGEN 0.2 12/24/2014 0311   NITRITE NEGATIVE 06/06/2017 1542   LEUKOCYTESUR NEGATIVE 06/06/2017 1542   Sepsis Labs: @LABRCNTIP (procalcitonin:4,lacticidven:4)  )No results found for this or any previous visit (from the past 240 hour(s)).    Radiology Studies: Dg Chest 2 View  Result Date: 06/06/2017 CLINICAL DATA:  Daughter stated, He is been sleeping for about 23 hours a day will go to the BR and then go back to bed. Eating but not as much as usual. Has Hx of copd,cad.ckd EXAM: CHEST  2 VIEW COMPARISON:  12/08/2016 FINDINGS: Prior median sternotomy. Pacer/AICD device. Mitral valve replacement. Midline trachea. Moderate cardiomegaly. Atherosclerosis in the transverse aorta. No right and no definite left pleural fluid. Remote right rib trauma. Moderate pulmonary interstitial prominence and indistinctness, progressive. Left pleural calcifications. Mild right hemidiaphragm elevation. IMPRESSION: Cardiomegaly with progressive pulmonary interstitial prominence. Favored to represent mild pulmonary venous congestion, superimposed upon the sequelae of smoking/ chronic bronchitis. Aortic Atherosclerosis (ICD10-I70.0). Electronically Signed   By: Ronaldo Miyamoto  Reche Dixon M.D.   On: 06/06/2017 13:53     Scheduled Meds: . apixaban  5 mg Oral BID  . atorvastatin  40 mg Oral Daily  . carvedilol  6.25 mg Oral Q breakfast  . carvedilol  9.375 mg Oral Q supper  . famotidine  20 mg Oral BID  . fenofibrate  54 mg Oral Daily  . furosemide  80 mg Intravenous Q12H  . insulin aspart  0-5 Units Subcutaneous QHS  . insulin aspart  0-9 Units Subcutaneous TID WC  . loratadine  10 mg Oral Daily  . mouth rinse  15 mL Mouth Rinse BID  . multivitamin with minerals  1 tablet Oral Daily  .  nicotine  21 mg Transdermal Daily  . sacubitril-valsartan  1 tablet Oral BID  . sodium chloride flush  3 mL Intravenous Q12H  . spironolactone  12.5 mg Oral Daily  . thiamine  100 mg Oral Daily   Continuous Infusions: . sodium chloride       LOS: 2 days   Time Spent in minutes   45 minutes  Bali Lyn D.O. on 06/08/2017 at 8:02 AM  Between 7am to 7pm - Pager - [phone]  After 7pm go to [email or web address] - password [email or web address]  And look for the night coverage person covering for me after hours  [facility] Office  [phone]

## 2017-06-08 NOTE — Progress Notes (Addendum)
Patient ID: LAYTHAN HAYTER, male   DOB: 02-Jul-1945, 72 y.o.   MRN: [id number]     Advanced Heart Failure Rounding Note  Primary Cardiologist: Shirlee Latch  Subjective:    Patient did not diurese well yesterday, creatinine up to 2 from 1.1.  SBP down to 90s at times yesterday but generally in 120s range.  No change in weight.    Conversation is somewhat difficult to follow, but he does not seem to be significantly short of breath and says he has been taking his meds at home.    Objective:   Weight Range: 170 lb 9.6 oz (77.4 kg) Body mass index is 26.72 kg/m.   Vital Signs:   Temp:  [97.6 F (36.4 C)-98.4 F (36.9 C)] 97.6 F (36.4 C) (01/01 0614) Pulse Rate:  [59-78] 61 (01/01 0834) Resp:  [16-18] 18 (01/01 0614) BP: (102-122)/(50-73) 122/61 (01/01 0834) SpO2:  [91 %-95 %] 91 % (01/01 0834) Weight:  [170 lb 9.6 oz (77.4 kg)] 170 lb 9.6 oz (77.4 kg) (01/01 0614) Last BM Date: (pt does not recall)  Weight change: Filed Weights   06/06/17 1930 06/07/17 0500 06/08/17 0614  Weight: 168 lb 14 oz (76.6 kg) 170 lb 6.7 oz (77.3 kg) 170 lb 9.6 oz (77.4 kg)    Intake/Output:   Intake/Output Summary (Last 24 hours) at 06/08/2017 0916 Last data filed at 06/08/2017 0752 Gross per 24 hour  Intake 720 ml  Output 925 ml  Net -205 ml      Physical Exam    General:  Chronically ill-appearing. No resp difficulty HEENT: Normal Neck: Supple. JVP 8-9 cm with HJR. No lymphadenopathy or thyromegaly appreciated. Cor: PMI lateral. Regular rate & rhythm. No rubs, gallops or murmurs. Lungs: Clear bilaterally Abdomen: Soft, nontender, nondistended. No hepatosplenomegaly. No bruits or masses. Good bowel sounds. Extremities: No cyanosis, clubbing, rash, edema Neuro: Alert & orientedx3 though conversation is difficult to follow, cranial nerves grossly intact. moves all 4 extremities w/o difficulty. Affect pleasant   Telemetry   Atrial fibrillation with BiV pacing (personally reviewed).   Labs    CBC Recent Labs    06/06/17 1037 06/08/17 0331  WBC 11.5* 10.1  HGB 12.9* 11.8*  HCT 41.6 39.0  MCV 93.5 96.1  PLT 229 199   Basic Metabolic Panel Recent Labs    40/98/11 0415 06/08/17 0331  NA 141 138  K 3.6 4.6  CL 98* 98*  CO2 33* 34*  GLUCOSE 154* 162*  BUN 23* 41*  CREATININE 1.18 2.03*  CALCIUM 9.4 8.9   Liver Function Tests No results for input(s): AST, ALT, ALKPHOS, BILITOT, PROT, ALBUMIN in the last 72 hours. No results for input(s): LIPASE, AMYLASE in the last 72 hours. Cardiac Enzymes Recent Labs    06/07/17 0742 06/07/17 1312 06/07/17 1914  TROPONINI 0.06* 0.05* 0.05*    BNP: BNP (last 3 results) Recent Labs    01/27/17 1046 02/01/17 1054 06/06/17 1037  BNP 136.7* 102.6* 486.4*    ProBNP (last 3 results) Recent Labs    05/25/17 1152  PROBNP 1,108*     D-Dimer No results for input(s): DDIMER in the last 72 hours. Hemoglobin A1C No results for input(s): HGBA1C in the last 72 hours. Fasting Lipid Panel No results for input(s): CHOL, HDL, LDLCALC, TRIG, CHOLHDL, LDLDIRECT in the last 72 hours. Thyroid Function Tests Recent Labs    06/07/17 0742  TSH 1.293    Other results:   Imaging     No results found.  Medications:     Scheduled Medications: . apixaban  5 mg Oral BID  . atorvastatin  40 mg Oral Daily  . carvedilol  6.25 mg Oral Q breakfast  . carvedilol  9.375 mg Oral Q supper  . famotidine  20 mg Oral BID  . fenofibrate  54 mg Oral Daily  . insulin aspart  0-5 Units Subcutaneous QHS  . insulin aspart  0-9 Units Subcutaneous TID WC  . loratadine  10 mg Oral Daily  . mouth rinse  15 mL Mouth Rinse BID  . multivitamin with minerals  1 tablet Oral Daily  . nicotine  21 mg Transdermal Daily  . sodium chloride flush  3 mL Intravenous Q12H  . spironolactone  12.5 mg Oral Daily  . thiamine  100 mg Oral Daily     Infusions: . sodium chloride       PRN Medications:  sodium chloride, acetaminophen,  guaiFENesin-dextromethorphan, ondansetron (ZOFRAN) IV, sodium chloride flush    Patient Profile   RIVER SHOULTS is a 72 y.o. 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    He was admitted on 12/30 with acute on chronic systolic and diastolic heart failure exacerbation.   Assessment/Plan   1. 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  MDT CRT-D. Still an active drinker. He was admitted with increased dyspnea, poor exercise tolerance. He has looked volume overloaded on exam and IV Lasix was started.  Yesterday, however, he did not diurese well and creatinine increased from 1.1 to 2.  This is somewhat surprising.  SBP mildly low at times in 90s but primarily in 120s.  Today, he does look somewhat volume overloaded on exam though not markedly.  - Stop IV Lasix, spironolactone, and Entresto today with AKI.  - Can continue Coreg for now. - He needs right heart cath to sort out hemodynamics and volume status. I discussed risks/benefits of procedure with him and he agrees to it.  I will also discuss with his daughter.  - Needs ETOH cessation.  2. CAD: No chest pain.  Mild troponin increase with no trend, likely demand ischemia related to volume overload. Last cath in 2008 with medical management.  - I would like to do coronary angiography along with RHC but will hold off for now with AKI.  May plan later this hospitalization once creatinine settles back down.  - Continue statin, no ASA given Eliquis use.  3. Atrial fibrillation: Chronic.  Had AV nodal ablation, now BiV pacing. Hold Eliquis this evening for RHC tomorrow. 4. COPD: Still active smoker, 2 ppd.  On home oxygen.  - Continue oxygen per [place] - Needs to quit smoking.  5. ETOH: Ongoing  abuse.  Needs to stop.  6. MV replacement: Bioprosthetic.  Looked ok on last echo.  7. AKI: As above, somewhat surprising.  I wonder if he has actually been taking his home meds (including Entresto).  If he has not been taking Entresto at home and we started this with aggressive diuresis here, may explain his AKI.  Will ask daughter about this.  8. Dementia: Suspect early dementia.   Length of Stay: 2  Marca Ancona, MD  06/08/2017, 9:16 AM  Advanced Heart Failure Team Pager [phone] (M-F; 7a - 4p)  Please contact [facility] for  night-coverage after hours (4p -7a ) and weekends on [email or web address]

## 2017-06-09 ENCOUNTER — Encounter (HOSPITAL_COMMUNITY): Payer: Self-pay | Admitting: Cardiology

## 2017-06-09 ENCOUNTER — Encounter (HOSPITAL_COMMUNITY)
Admission: EM | Disposition: A | Discharge status: Home / Self Care | Payer: Self-pay | Source: Home / Self Care | Attending: Internal Medicine

## 2017-06-09 ENCOUNTER — Inpatient Hospital Stay (HOSPITAL_COMMUNITY): Payer: Medicare Other

## 2017-06-09 DIAGNOSIS — I739 Peripheral vascular disease, unspecified: Secondary | ICD-10-CM

## 2017-06-09 DIAGNOSIS — I5043 Acute on chronic combined systolic (congestive) and diastolic (congestive) heart failure: Secondary | ICD-10-CM

## 2017-06-09 DIAGNOSIS — I2510 Atherosclerotic heart disease of native coronary artery without angina pectoris: Secondary | ICD-10-CM

## 2017-06-09 DIAGNOSIS — R0600 Dyspnea, unspecified: Secondary | ICD-10-CM

## 2017-06-09 HISTORY — PX: RIGHT/LEFT HEART CATH AND CORONARY ANGIOGRAPHY: CATH118266

## 2017-06-09 LAB — BLOOD GAS, ARTERIAL
Acid-Base Excess: 7.7 mmol/L — ABNORMAL HIGH (ref 0.0–2.0)
Bicarbonate: 34.4 mmol/L — ABNORMAL HIGH (ref 20.0–28.0)
Drawn by: 277551
FIO2: 28
O2 Saturation: 96.2 %
PATIENT TEMPERATURE: 98.6
PH ART: 7.269 — AB (ref 7.350–7.450)
PO2 ART: 92.1 mmHg (ref 83.0–108.0)
pCO2 arterial: 77.6 mmHg (ref 32.0–48.0)

## 2017-06-09 LAB — CBC
HEMATOCRIT: 39.2 % (ref 39.0–52.0)
HEMOGLOBIN: 11.8 g/dL — AB (ref 13.0–17.0)
MCH: 28.7 pg (ref 26.0–34.0)
MCHC: 30.1 g/dL (ref 30.0–36.0)
MCV: 95.4 fL (ref 78.0–100.0)
Platelets: 214 10*3/uL (ref 150–400)
RBC: 4.11 MIL/uL — ABNORMAL LOW (ref 4.22–5.81)
RDW: 14.5 % (ref 11.5–15.5)
WBC: 11.3 10*3/uL — ABNORMAL HIGH (ref 4.0–10.5)

## 2017-06-09 LAB — BASIC METABOLIC PANEL
ANION GAP: 8 (ref 5–15)
BUN: 43 mg/dL — ABNORMAL HIGH (ref 6–20)
CO2: 31 mmol/L (ref 22–32)
Calcium: 9.3 mg/dL (ref 8.9–10.3)
Chloride: 100 mmol/L — ABNORMAL LOW (ref 101–111)
Creatinine, Ser: 1.54 mg/dL — ABNORMAL HIGH (ref 0.61–1.24)
GFR calc Af Amer: 51 mL/min — ABNORMAL LOW (ref >60)
GFR, EST NON AFRICAN AMERICAN: 44 mL/min — AB (ref >60)
Glucose, Bld: 147 mg/dL — ABNORMAL HIGH (ref 65–99)
POTASSIUM: 5.1 mmol/L (ref 3.5–5.1)
SODIUM: 139 mmol/L (ref 135–145)

## 2017-06-09 LAB — GLUCOSE, CAPILLARY
GLUCOSE-CAPILLARY: 167 mg/dL — AB (ref 65–99)
GLUCOSE-CAPILLARY: 274 mg/dL — AB (ref 65–99)
Glucose-Capillary: 157 mg/dL — ABNORMAL HIGH (ref 65–99)
Glucose-Capillary: 186 mg/dL — ABNORMAL HIGH (ref 65–99)

## 2017-06-09 LAB — POCT I-STAT 3, VENOUS BLOOD GAS (G3P V)
ACID-BASE EXCESS: 6 mmol/L — AB (ref 0.0–2.0)
Acid-Base Excess: 7 mmol/L — ABNORMAL HIGH (ref 0.0–2.0)
Bicarbonate: 34.9 mmol/L — ABNORMAL HIGH (ref 20.0–28.0)
Bicarbonate: 36.1 mmol/L — ABNORMAL HIGH (ref 20.0–28.0)
O2 SAT: 63 %
O2 SAT: 65 %
PCO2 VEN: 74.9 mmHg — AB (ref 44.0–60.0)
PCO2 VEN: 77.5 mmHg — AB (ref 44.0–60.0)
PH VEN: 7.277 (ref 7.250–7.430)
PO2 VEN: 39 mmHg (ref 32.0–45.0)
PO2 VEN: 40 mmHg (ref 32.0–45.0)
TCO2: 38 mmol/L — AB (ref 22–32)
TCO2: 37 mmol/L — ABNORMAL HIGH (ref 22–32)
pH, Ven: 7.276 (ref 7.250–7.430)

## 2017-06-09 LAB — AMMONIA: Ammonia: 48 umol/L — ABNORMAL HIGH (ref 9–35)

## 2017-06-09 SURGERY — RIGHT/LEFT HEART CATH AND CORONARY ANGIOGRAPHY
Anesthesia: LOCAL

## 2017-06-09 MED ORDER — IOPAMIDOL (ISOVUE-370) INJECTION 76%
INTRAVENOUS | Status: AC
  Filled 2017-06-09: qty 100

## 2017-06-09 MED ORDER — SODIUM CHLORIDE 0.9% FLUSH: 3.0000 mL | INTRAVENOUS | Status: DC | PRN

## 2017-06-09 MED ORDER — SODIUM CHLORIDE 0.9 % IV SOLN
INTRAVENOUS | Status: AC
  Administered 2017-06-09: 10:00:00 via INTRAVENOUS

## 2017-06-09 MED ORDER — SODIUM CHLORIDE 0.9 % IV SOLN: 250.0000 mL | INTRAVENOUS | Status: DC | PRN

## 2017-06-09 MED ORDER — HEPARIN SODIUM (PORCINE) 1000 UNIT/ML IJ SOLN
INTRAMUSCULAR | Status: DC | PRN
  Administered 2017-06-09: 4000 [IU] via INTRAVENOUS

## 2017-06-09 MED ORDER — SODIUM CHLORIDE 0.9% FLUSH
3.0000 mL | Freq: Two times a day (BID) | INTRAVENOUS | Status: DC
  Administered 2017-06-09 – 2017-06-13 (×9): 3 mL via INTRAVENOUS

## 2017-06-09 MED ORDER — HEPARIN (PORCINE) IN NACL 2-0.9 UNIT/ML-% IJ SOLN
INTRAMUSCULAR | Status: DC | PRN
  Administered 2017-06-09: 10 mL via INTRA_ARTERIAL

## 2017-06-09 MED ORDER — MIDAZOLAM HCL 2 MG/2ML IJ SOLN
INTRAMUSCULAR | Status: AC
  Filled 2017-06-09: qty 2

## 2017-06-09 MED ORDER — NALOXONE HCL 0.4 MG/ML IJ SOLN
0.4000 mg | INTRAMUSCULAR | Status: DC | PRN
  Administered 2017-06-09: 0.4 mg via INTRAVENOUS

## 2017-06-09 MED ORDER — FUROSEMIDE 10 MG/ML IJ SOLN
80.0000 mg | Freq: Two times a day (BID) | INTRAMUSCULAR | Status: DC
  Administered 2017-06-09 – 2017-06-13 (×8): 80 mg via INTRAVENOUS
  Filled 2017-06-09 (×8): qty 8

## 2017-06-09 MED ORDER — NALOXONE HCL 0.4 MG/ML IJ SOLN
INTRAMUSCULAR | Status: AC
  Administered 2017-06-09: 0.4 mg via INTRAVENOUS
  Filled 2017-06-09: qty 1

## 2017-06-09 MED ORDER — HEPARIN (PORCINE) IN NACL 2-0.9 UNIT/ML-% IJ SOLN
INTRAMUSCULAR | Status: AC
  Filled 2017-06-09: qty 1000

## 2017-06-09 MED ORDER — LIDOCAINE HCL (PF) 1 % IJ SOLN
INTRAMUSCULAR | Status: AC
  Filled 2017-06-09: qty 30

## 2017-06-09 MED ORDER — HEPARIN (PORCINE) IN NACL 2-0.9 UNIT/ML-% IJ SOLN
INTRAMUSCULAR | Status: AC | PRN
  Administered 2017-06-09: 1000 mL

## 2017-06-09 MED ORDER — FENTANYL CITRATE (PF) 100 MCG/2ML IJ SOLN
INTRAMUSCULAR | Status: AC
  Filled 2017-06-09: qty 2

## 2017-06-09 MED ORDER — ONDANSETRON HCL 4 MG/2ML IJ SOLN: 4.0000 mg | Freq: Four times a day (QID) | INTRAMUSCULAR | Status: DC | PRN

## 2017-06-09 MED ORDER — MIDAZOLAM HCL 2 MG/2ML IJ SOLN
INTRAMUSCULAR | Status: DC | PRN
  Administered 2017-06-09: 0.5 mg via INTRAVENOUS

## 2017-06-09 MED ORDER — FENTANYL CITRATE (PF) 100 MCG/2ML IJ SOLN
INTRAMUSCULAR | Status: DC | PRN
  Administered 2017-06-09: 25 ug via INTRAVENOUS

## 2017-06-09 MED ORDER — VERAPAMIL HCL 2.5 MG/ML IV SOLN
INTRAVENOUS | Status: AC
  Filled 2017-06-09: qty 2

## 2017-06-09 MED ORDER — HEPARIN SODIUM (PORCINE) 1000 UNIT/ML IJ SOLN
INTRAMUSCULAR | Status: AC
  Filled 2017-06-09: qty 1

## 2017-06-09 MED ORDER — LACTULOSE 10 GM/15ML PO SOLN
10.0000 g | Freq: Once | ORAL | Status: AC
  Administered 2017-06-09: 10 g via ORAL
  Filled 2017-06-09: qty 15

## 2017-06-09 MED ORDER — ACETAMINOPHEN 325 MG PO TABS: 650.0000 mg | ORAL_TABLET | ORAL | Status: DC | PRN

## 2017-06-09 MED ORDER — APIXABAN 2.5 MG PO TABS
2.5000 mg | ORAL_TABLET | Freq: Two times a day (BID) | ORAL | Status: DC
  Administered 2017-06-09: 2.5 mg via ORAL
  Filled 2017-06-09 (×2): qty 1

## 2017-06-09 MED ORDER — IOPAMIDOL (ISOVUE-370) INJECTION 76%
INTRAVENOUS | Status: DC | PRN
  Administered 2017-06-09: 700 mL via INTRAVENOUS

## 2017-06-09 MED ORDER — LIDOCAINE HCL (PF) 1 % IJ SOLN
INTRAMUSCULAR | Status: DC | PRN
  Administered 2017-06-09: 2 mL

## 2017-06-09 SURGICAL SUPPLY — 14 items
CATH 5FR JL3.5 JR4 ANG PIG MP (CATHETERS) ×2 IMPLANT
CATH BALLN WEDGE 5F 110CM (CATHETERS) ×2 IMPLANT
CATH LAUNCHER 6FR EBU3.5 (CATHETERS) ×2 IMPLANT
CATH LAUNCHER 6FR JL3 (CATHETERS) ×2 IMPLANT
DEVICE RAD COMP TR BAND LRG (VASCULAR PRODUCTS) ×2 IMPLANT
GLIDESHEATH SLEND SS 6F .021 (SHEATH) ×2 IMPLANT
GUIDEWIRE INQWIRE 1.5J.035X260 (WIRE) ×1 IMPLANT
INQWIRE 1.5J .035X260CM (WIRE) ×2
KIT HEART LEFT (KITS) ×2 IMPLANT
PACK CARDIAC CATHETERIZATION (CUSTOM PROCEDURE TRAY) ×2 IMPLANT
SHEATH GLIDE SLENDER 4/5FR (SHEATH) ×2 IMPLANT
TRANSDUCER W/STOPCOCK (MISCELLANEOUS) ×2 IMPLANT
TUBING CIL FLEX 10 FLL-RA (TUBING) ×2 IMPLANT
WIRE EMERALD 3MM-J .025X260CM (WIRE) ×2 IMPLANT

## 2017-06-09 NOTE — Progress Notes (Signed)
RT came to do abg. Pt is alert and awake. ABG done. Pt followed all commands for RT. Pt on 2L , sats 96, hr 60, BBS clr dim, bp 130/72. CNA at the bedside helping pt eat lunch. RN notified

## 2017-06-09 NOTE — Progress Notes (Signed)
*  PRELIMINARY RESULTS* Vascular Ultrasound Aorta/Iliac artery duplex completed. Study was technically limited due to overlying bowel gas and patient body habitus.  Elevated velocities suggest possible 30-49% right common iliac artery stenosis and 50-74% bilateral external iliac artery stenosis.   Unable to visualize the aorta due to technical limitations.  06/09/2017 12:04 PM Gertie Fey, BS, RVT, RDCS, RDMS

## 2017-06-09 NOTE — Progress Notes (Signed)
Attempted IV x 1 in Right AC pre-right heart cath, but unsuccessful.  Cath Lab Holding RN at bedside--will attempt to start an IV in Holding.  Called patient's daughter [name] to inform her that her father is going to the cath lab now, and not at noon as previously scheduled.

## 2017-06-09 NOTE — Progress Notes (Signed)
Patient ID: [name], male   DOB: 1945-11-08, 72 y.o.   MRN: [id number]     Advanced Heart Failure Rounding Note  Primary Cardiologist: Shirlee Latch  Subjective:    BP stable yesterday.  Creatinine down from 2 to 1.5.  Diuretics held with AKI.  He denies dyspnea at rest.      Objective:   Weight Range: 171 lb 11.2 oz (77.9 kg) Body mass index is 26.89 kg/m.   Vital Signs:   Temp:  [97.8 F (36.6 C)-97.9 F (36.6 C)] 97.9 F (36.6 C) (01/02 0535) Pulse Rate:  [59-61] 60 (01/02 0535) Resp:  [18-20] 18 (01/02 0535) BP: (105-125)/(52-61) 125/58 (01/02 0535) SpO2:  [91 %-94 %] 94 % (01/02 0535) Weight:  [171 lb 11.2 oz (77.9 kg)-171 lb 11.8 oz (77.9 kg)] 171 lb 11.2 oz (77.9 kg) (01/02 0535) Last BM Date: (pt does not recall)  Weight change: Filed Weights   06/08/17 0614 06/09/17 0046 06/09/17 0535  Weight: 170 lb 9.6 oz (77.4 kg) 171 lb 11.8 oz (77.9 kg) 171 lb 11.2 oz (77.9 kg)    Intake/Output:   Intake/Output Summary (Last 24 hours) at 06/09/2017 0804 Last data filed at 06/09/2017 0746 Gross per 24 hour  Intake 680 ml  Output 375 ml  Net 305 ml      Physical Exam    General: NAD Neck: JVP 10 cm, no thyromegaly or thyroid nodule.  Lungs: Clear to auscultation bilaterally with normal respiratory effort. CV: Lateral PMI.  Heart regular S1/S2, no S3/S4, no murmur.  No peripheral edema.   Abdomen: Soft, nontender, no hepatosplenomegaly, mild distention.  Skin: Intact without lesions or rashes.  Neurologic: Alert and oriented x 3.  Psych: Normal affect. Extremities: No clubbing or cyanosis.  HEENT: Normal.    Telemetry   Atrial fibrillation with BiV pacing (personally reviewed).   Labs    CBC Recent Labs    06/08/17 0331 06/09/17 0255  WBC 10.1 11.3*  HGB 11.8* 11.8*  HCT 39.0 39.2  MCV 96.1 95.4  PLT 199 214   Basic Metabolic Panel Recent Labs    92/95/74 0331 06/09/17 0255  NA 138 139  K 4.6 5.1  CL 98* 100*  CO2 34* 31  GLUCOSE 162* 147*    BUN 41* 43*  CREATININE 2.03* 1.54*  CALCIUM 8.9 9.3   Liver Function Tests No results for input(s): AST, ALT, ALKPHOS, BILITOT, PROT, ALBUMIN in the last 72 hours. No results for input(s): LIPASE, AMYLASE in the last 72 hours. Cardiac Enzymes Recent Labs    06/07/17 0742 06/07/17 1312 06/07/17 1914  TROPONINI 0.06* 0.05* 0.05*    BNP: BNP (last 3 results) Recent Labs    01/27/17 1046 02/01/17 1054 06/06/17 1037  BNP 136.7* 102.6* 486.4*    ProBNP (last 3 results) Recent Labs    05/25/17 1152  PROBNP 1,108*     D-Dimer No results for input(s): DDIMER in the last 72 hours. Hemoglobin A1C No results for input(s): HGBA1C in the last 72 hours. Fasting Lipid Panel No results for input(s): CHOL, HDL, LDLCALC, TRIG, CHOLHDL, LDLDIRECT in the last 72 hours. Thyroid Function Tests Recent Labs    06/07/17 0742  TSH 1.293    Other results:   Imaging    No results found.   Medications:     Scheduled Medications: . atorvastatin  40 mg Oral Daily  . carvedilol  6.25 mg Oral Q breakfast  . carvedilol  9.375 mg Oral Q supper  . famotidine  20 mg Oral BID  . fenofibrate  54 mg Oral Daily  . insulin aspart  0-5 Units Subcutaneous QHS  . insulin aspart  0-9 Units Subcutaneous TID WC  . loratadine  10 mg Oral Daily  . mouth rinse  15 mL Mouth Rinse BID  . multivitamin with minerals  1 tablet Oral Daily  . nicotine  21 mg Transdermal Daily  . sodium chloride flush  3 mL Intravenous Q12H  . sodium chloride flush  3 mL Intravenous Q12H  . thiamine  100 mg Oral Daily    Infusions: . sodium chloride    . sodium chloride    . sodium chloride 10 mL/hr at 06/09/17 0647    PRN Medications: sodium chloride, sodium chloride, acetaminophen, guaiFENesin-dextromethorphan, ondansetron (ZOFRAN) IV, sodium chloride flush, sodium chloride flush    Patient Profile   [name] is a 72 y.o. 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    He was admitted on 12/30 with acute on chronic systolic and diastolic heart failure exacerbation.   Assessment/Plan   1. 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  MDT CRT-D. Still an active drinker. He was admitted with increased dyspnea, poor exercise tolerance. He has looked volume overloaded on exam and IV Lasix was started.  Initially, he did not diurese well and creatinine increased from 1.1 to 2 and diuretics held.  This was somewhat surprising.  SBP mildly low at times in 90s but primarily in 120s.  Diuretics and Entresto held yesterday, creatinine down to 1.5. On my exam, he does look volume overloaded.  ?Low output HF with cardiorenal syndrome.   - Hold Lasix, spironolactone, and Entresto until after cath today.  - Can continue Coreg for now. - He needs right heart cath to sort out hemodynamics and volume status, may need inotropic support. I discussed risks/benefits of procedure with him and daughter and they agree to it.   - Needs ETOH cessation.  2. CAD: No chest pain.  Mild troponin increase with no trend, likely demand ischemia related to volume overload. Last cath in 2008 with medical management but with moderate left main stenosis.  Creatinine down to 1.5 today.  - Now that creatinine is trending down, will do coronary angiography in addition to RHC, will limit contrast.  - Continue statin, no ASA given Eliquis use.  3. Atrial fibrillation: Chronic.  Had AV nodal ablation, now BiV pacing. Eliquis on hold for cath. 4. COPD: Still active smoker, 2 ppd.  On home oxygen.  - Continue oxygen per [place] - Needs to quit smoking.  5. ETOH: Ongoing abuse.  Needs to stop.  6. MV replacement: Bioprosthetic.  Looked ok on last echo.  7. AKI: As above, somewhat  surprising.  I wonder if he has actually been taking his home meds (including Entresto).  If he has not been taking Entresto at home and we started this with aggressive diuresis here, may explain his AKI.  Daughter, however, thinks he has been taking his meds.  Creatinine down to 1.5 today, ?cardiorenal.   8. Dementia: Suspect early dementia.   Length of Stay: 3  Marca Ancona, MD  06/09/2017, 8:04 AM  Advanced Heart Failure Team Pager [phone] (M-F; 7a - 4p)  Please contact [facility] for night-coverage  after hours (4p -7a ) and weekends on [email or web address]

## 2017-06-09 NOTE — Progress Notes (Addendum)
PROGRESS NOTE    [name]  [name]:[id number] DOB: [birth year] DOA: 06/06/2017 PCP: Georgann Housekeeper, MD   Chief Complaint  Patient presents with  . Fatigue  . Weakness    Brief Narrative:  72 y.o. male with a medical history of CHF, atrial fibrillation, coronary disease, who presents to the emergency department with complaints of increasing shortness of breath, fatigue, weight gain. Admitted for CHF exacerbation. Cardiology consulted.   Assessment & Plan   Acute Combined Systolic and Diastolic Heart Failure -Patient presented with shortness of breath, fatigue, weight gain  -Last echocardiogram 04/07/2017 EF 25-30%, grade 2 diastolic dysfunction -BNP on admission 486.4 -CXR: Cardiomegaly with pulmonary edema, vascular congestion -Was recently placed on Torsemide 2 weeks ago by cardiology -IV lasix increased to 80mg  BID, spironolactone restarted- but held on 1/1 due to AKI -Entresto also held -Continue monitor intake/output, daily weights -Discussed with Dr. Shirlee Latch, patient has been nonadherent with meds and continues to drink and smoke. Plan for catheterization today and possible restart diuretics and Entresto afterwards  Permanent atrial fibrillation -S/p AVN ablation -CHADSVASC 6 -Continue Eliquis and coreg  Essential Hypertension -Continue coreg -monitor closely  Acute on Chronic hypoxic respiratory failure and dyspnea/COPD  -Likely secondary to CHF exacerbation and COPD -Oxygen saturation in the 80s -patient on chronic supplemental oxygen, 2.5L -continue supplemental oxygen to maintain saturations above 92%  Nicotine abuse -Smokes 2 ppd per patient -Continue nicotine patch -discussed smoking cessation  Fatigue -has been ongoing for 5 weeks -suspect secondary to CHF -per daughter, patient has been sleeping 23 hours -TSH 1.293, Vit B12 754, Vitamin D 31.7 (all within normal limits) -will add on an ammonia level -wonder if patient has underlying dementia  which could be adding to this "fatigue"  Hyperlipidemia -Continue statin, fenofibrate  GERD  -Continue H2 blocker  AKI on Chronic kidney disease, stage III -Creatinine bumped to 2.03 on 06/08/2017, however GFR still in stage III -Creatinine 1.5 today -continue to monitor BMP  Alcohol abuse -continue thiamine -continues to drink moonshine and beer- admits to drinking 8 beers per day -discussed cessation   Diabetes mellitus, type II -hold glipizide -continue ISS and CBG monitoring   Elevated Troponin -likely secondary to demand and CHF exac -troponin being cycled, and currently flat, 0.06, 0.05 -as above, cath today -continue to monitor    DVT Prophylaxis  Eliquis  Code Status: Full  Family Communication: none at bedside  Disposition Plan: Admitted. Continue treatment of CHF, cath today  Consultants Cardiology  Procedures  None  Antibiotics   Anti-infectives (From admission, onward)   None      Subjective:   [name] seen and examined today.  Feels breathing has improved. Denies chest pain, abdominal pain, N/VD/C.   Objective:   Vitals:   06/08/17 1628 06/08/17 2100 06/09/17 0046 06/09/17 0535  BP: 105/61   (!) 125/58  Pulse:  61  60  Resp:    18  Temp:    97.9 F (36.6 C)  TempSrc:    Oral  SpO2:  94%  94%  Weight:   77.9 kg (171 lb 11.8 oz) 77.9 kg (171 lb 11.2 oz)  Height:        Intake/Output Summary (Last 24 hours) at 06/09/2017 0835 Last data filed at 06/09/2017 0746 Gross per 24 hour  Intake 680 ml  Output 375 ml  Net 305 ml   Filed Weights   06/08/17 0614 06/09/17 0046 06/09/17 0535  Weight: 77.4 kg (170 lb 9.6 oz) 77.9 kg (171  lb 11.8 oz) 77.9 kg (171 lb 11.2 oz)   Exam  General: Well developed, Chronically ill appearing, NAD  HEENT: NCAT, mucous membranes moist.   Neck: Supple, + JVD, no masses  Cardiovascular: S1 S2 auscultated, RRR, no murmurs  Respiratory: Diminished breath sounds  Abdomen: Soft, nontender,  +distended, + bowel sounds, ventral hernia  Extremities: warm dry without cyanosis clubbing or edema  Neuro: AAOx3, nonfocal  Psych: Normal affect and demeanor, pleasant   Data Reviewed: I have personally reviewed following labs and imaging studies  CBC: Recent Labs  Lab 06/06/17 1037 06/08/17 0331 06/09/17 0255  WBC 11.5* 10.1 11.3*  HGB 12.9* 11.8* 11.8*  HCT 41.6 39.0 39.2  MCV 93.5 96.1 95.4  PLT 229 199 214   Basic Metabolic Panel: Recent Labs  Lab 06/06/17 1037 06/07/17 0415 06/08/17 0331 06/09/17 0255  NA 139 141 138 139  K 3.7 3.6 4.6 5.1  CL 95* 98* 98* 100*  CO2 32 33* 34* 31  GLUCOSE 231* 154* 162* 147*  BUN 22* 23* 41* 43*  CREATININE 1.28* 1.18 2.03* 1.54*  CALCIUM 9.8 9.4 8.9 9.3   GFR: Estimated Creatinine Clearance: 42.3 mL/min (A) (by C-G formula based on SCr of 1.54 mg/dL (H)). Liver Function Tests: No results for input(s): AST, ALT, ALKPHOS, BILITOT, PROT, ALBUMIN in the last 168 hours. No results for input(s): LIPASE, AMYLASE in the last 168 hours. No results for input(s): AMMONIA in the last 168 hours. Coagulation Profile: No results for input(s): INR, PROTIME in the last 168 hours. Cardiac Enzymes: Recent Labs  Lab 06/06/17 1613 06/07/17 0742 06/07/17 1312 06/07/17 1914  TROPONINI 0.06* 0.06* 0.05* 0.05*   BNP (last 3 results) Recent Labs    05/25/17 1152  PROBNP 1,108*   HbA1C: No results for input(s): HGBA1C in the last 72 hours. CBG: Recent Labs  Lab 06/08/17 0732 06/08/17 1109 06/08/17 1639 06/08/17 2023 06/09/17 0745  GLUCAP 175* 189* 122* 186* 157*   Lipid Profile: No results for input(s): CHOL, HDL, LDLCALC, TRIG, CHOLHDL, LDLDIRECT in the last 72 hours. Thyroid Function Tests: Recent Labs    06/07/17 0742  TSH 1.293   Anemia Panel: Recent Labs    06/07/17 0742  VITAMINB12 754   Urine analysis:    Component Value Date/Time   COLORURINE YELLOW 06/06/2017 1542   APPEARANCEUR CLEAR 06/06/2017 1542    LABSPEC 1.015 06/06/2017 1542   PHURINE 7.0 06/06/2017 1542   GLUCOSEU >=500 (A) 06/06/2017 1542   HGBUR NEGATIVE 06/06/2017 1542   BILIRUBINUR NEGATIVE 06/06/2017 1542   KETONESUR NEGATIVE 06/06/2017 1542   PROTEINUR NEGATIVE 06/06/2017 1542   UROBILINOGEN 0.2 12/24/2014 0311   NITRITE NEGATIVE 06/06/2017 1542   LEUKOCYTESUR NEGATIVE 06/06/2017 1542   Sepsis Labs: @LABRCNTIP (procalcitonin:4,lacticidven:4)  )No results found for this or any previous visit (from the past 240 hour(s)).    Radiology Studies: No results found.   Scheduled Meds: . [MAR Hold] atorvastatin  40 mg Oral Daily  . [MAR Hold] carvedilol  6.25 mg Oral Q breakfast  . [MAR Hold] carvedilol  9.375 mg Oral Q supper  . [MAR Hold] famotidine  20 mg Oral BID  . [MAR Hold] fenofibrate  54 mg Oral Daily  . [MAR Hold] insulin aspart  0-5 Units Subcutaneous QHS  . [MAR Hold] insulin aspart  0-9 Units Subcutaneous TID WC  . [MAR Hold] loratadine  10 mg Oral Daily  . [MAR Hold] mouth rinse  15 mL Mouth Rinse BID  . [MAR Hold] multivitamin with minerals  1 tablet Oral Daily  . [MAR Hold] nicotine  21 mg Transdermal Daily  . [MAR Hold] sodium chloride flush  3 mL Intravenous Q12H  . sodium chloride flush  3 mL Intravenous Q12H  . [MAR Hold] thiamine  100 mg Oral Daily   Continuous Infusions: . [MAR Hold] sodium chloride    . sodium chloride    . sodium chloride 10 mL/hr at 06/09/17 0647     LOS: 3 days   Time Spent in minutes   45 minutes  [name] D.O. on 06/09/2017 at 8:35 AM  Between 7am to 7pm - Pager - [phone]  After 7pm go to [email or web address] - password [email or web address]  And look for the night coverage person covering for me after hours  [facility] Office  [phone]

## 2017-06-09 NOTE — Progress Notes (Signed)
Patient more lethargic this afternoon.   GCS 13.  Vital signs stable, V-Paced at 60.  Notified MD of decreased LOC.  Patient able to move all extremities, no facial droop.  Narcan 0.4mg  ordered and given (received Fentanyl in Cath Lab earlier today).  Will give Lactulose when more alert and able to follow commands.  Respiratory to initiate Bipap, then draw an ABG to determine CO2 level.

## 2017-06-09 NOTE — Progress Notes (Addendum)
A half hour after Narcan was given, patient woke up.  He is now eating lunch and A&O x 4.

## 2017-06-10 DIAGNOSIS — I25118 Atherosclerotic heart disease of native coronary artery with other forms of angina pectoris: Secondary | ICD-10-CM

## 2017-06-10 DIAGNOSIS — J449 Chronic obstructive pulmonary disease, unspecified: Secondary | ICD-10-CM

## 2017-06-10 DIAGNOSIS — E7849 Other hyperlipidemia: Secondary | ICD-10-CM

## 2017-06-10 DIAGNOSIS — F17210 Nicotine dependence, cigarettes, uncomplicated: Secondary | ICD-10-CM

## 2017-06-10 LAB — GLUCOSE, CAPILLARY
GLUCOSE-CAPILLARY: 152 mg/dL — AB (ref 65–99)
GLUCOSE-CAPILLARY: 179 mg/dL — AB (ref 65–99)
Glucose-Capillary: 199 mg/dL — ABNORMAL HIGH (ref 65–99)
Glucose-Capillary: 156 mg/dL — ABNORMAL HIGH (ref 65–99)

## 2017-06-10 LAB — BASIC METABOLIC PANEL
Anion gap: 4 — ABNORMAL LOW (ref 5–15)
BUN: 32 mg/dL — AB (ref 6–20)
CALCIUM: 9.3 mg/dL (ref 8.9–10.3)
CHLORIDE: 101 mmol/L (ref 101–111)
CO2: 36 mmol/L — AB (ref 22–32)
Creatinine, Ser: 1.28 mg/dL — ABNORMAL HIGH (ref 0.61–1.24)
GFR calc non Af Amer: 55 mL/min — ABNORMAL LOW (ref >60)
GLUCOSE: 137 mg/dL — AB (ref 65–99)
Potassium: 5 mmol/L (ref 3.5–5.1)
Sodium: 141 mmol/L (ref 135–145)

## 2017-06-10 LAB — CBC
HCT: 38.9 % — ABNORMAL LOW (ref 39.0–52.0)
Hemoglobin: 11.1 g/dL — ABNORMAL LOW (ref 13.0–17.0)
MCH: 27.3 pg (ref 26.0–34.0)
MCHC: 28.5 g/dL — AB (ref 30.0–36.0)
MCV: 95.8 fL (ref 78.0–100.0)
Platelets: 210 10*3/uL (ref 150–400)
RBC: 4.06 MIL/uL — AB (ref 4.22–5.81)
RDW: 14.3 % (ref 11.5–15.5)
WBC: 11.2 10*3/uL — ABNORMAL HIGH (ref 4.0–10.5)

## 2017-06-10 MED ORDER — METOLAZONE 5 MG PO TABS
2.5000 mg | ORAL_TABLET | Freq: Once | ORAL | Status: AC
  Administered 2017-06-10: 2.5 mg via ORAL
  Filled 2017-06-10: qty 1

## 2017-06-10 MED ORDER — APIXABAN 5 MG PO TABS
5.0000 mg | ORAL_TABLET | Freq: Two times a day (BID) | ORAL | Status: DC
  Administered 2017-06-10 – 2017-06-13 (×7): 5 mg via ORAL
  Filled 2017-06-10 (×7): qty 1

## 2017-06-10 NOTE — Progress Notes (Signed)
[facility]                                                                              Patient Demographics  [name], is a 72 y.o. male, DOB - 23-May-1946, ZOX:[id number]  Admit date - 06/06/2017   Admitting Physician Edsel Petrin, DO  Outpatient Primary MD for the patient is Georgann Housekeeper, MD  Outpatient specialists:   LOS - 4  days   Medical records reviewed and are as summarized below:    Chief Complaint  Patient presents with  . Fatigue  . Weakness       Brief summary   72 y.o.malewith a medical history ofCHF, atrial fibrillation, coronary disease, who presents to the emergency department with complaints of increasing shortness of breath, fatigue, weight gain. Admitted for CHF exacerbation. Cardiology consulted.   Assessment & Plan    Acute combined systolic and diastolic CHF exacerbation, CAD - presented with shortness of breath, fatigue, weight gain, BNP 486.4 on admission. -Chest x-ray showed pulmonary edema -Was recently placed on torsemide 2 weeks ago by cardiology. -Patient was placed on IV Lasix, increase to 80 mg twice a day, spironolactone restarted but held on 1/1 due to AK I.  Entresto also held. - Cardiology following underwent cardiac cath showed severe distal left main stenosis, chronic occlusion of RCA, patency of LAD and left circumflex but with ostial involvement of both vessels.  Per Dr. Excell Seltzer, high risk for atherectomy and stenting of the left main into the LAD, not a candidate for CABG secondary to 2 prior sternotomies, severe COPD and mild dementia.  Recommended ongoing palliative medical therapy for patient's CAD.  -Per cardiology, continue IV Lasix with dose of metolazone 2.5 mg x1 -Continue statin, no aspirin given Eliquis use    Atrial fibrillation, chronic (HCC) -Rate controlled, had AV nodal ablation, continue Eliquis    Hyperlipidemia -Continue Lipitor    Hypertension -Currently stable    COPD GOLD 0  still smoking  -Patient counseled on smoking cessation -Continue O2  Mild dementia -Currently at baseline  Acute kidney injury on chronic kidney disease stage III -Creatinine trending up to 2.0, now improving, 1.2 today -Resolved, spironolactone, Entresto held  Code Status: full  DVT Prophylaxis:  apixaban Family Communication: Discussed in detail with the patient, all imaging results, lab results explained to the patient  Disposition Plan:   Time Spent in minutes   25 minutes  Procedures:  Cardiac cath   Consultants:   Cardiology   Antimicrobials:      Medications  Scheduled Meds: . apixaban  5 mg Oral BID  . atorvastatin  40 mg Oral Daily  . carvedilol  6.25 mg Oral Q breakfast  . carvedilol  9.375 mg Oral Q supper  . famotidine  20 mg Oral BID  . fenofibrate  54 mg Oral Daily  . furosemide  80 mg Intravenous BID  . insulin aspart  0-5 Units Subcutaneous QHS  . insulin aspart  0-9 Units Subcutaneous TID WC  . loratadine  10 mg Oral Daily  . mouth rinse  15 mL Mouth Rinse BID  . multivitamin with minerals  1 tablet  Oral Daily  . nicotine  21 mg Transdermal Daily  . sodium chloride flush  3 mL Intravenous Q12H  . sodium chloride flush  3 mL Intravenous Q12H  . thiamine  100 mg Oral Daily   Continuous Infusions: . sodium chloride    . sodium chloride     PRN Meds:.sodium chloride, sodium chloride, acetaminophen, guaiFENesin-dextromethorphan, naLOXone (NARCAN)  injection, ondansetron (ZOFRAN) IV, sodium chloride flush, sodium chloride flush   Antibiotics   Anti-infectives (From admission, onward)   None        Subjective:   [name] was seen and examined today. Patient denies dizziness, chest pain, shortness of breath, abdominal pain, N/V/D/C, new weakness, numbess, tingling. No acute events overnight.    Objective:   Vitals:   06/09/17 1535 06/09/17 2150 06/10/17 0500 06/10/17 0635  BP: 118/71 110/62 119/72 116/64  Pulse: (!) 58 60 (!) 59  (!) 59  Resp:  18 16   Temp:  98.1 F (36.7 C) 97.7 F (36.5 C)   TempSrc:  Oral Axillary   SpO2: 93% 93% 91% 95%  Weight:   77.9 kg (171 lb 11.8 oz)   Height:        Intake/Output Summary (Last 24 hours) at 06/10/2017 1311 Last data filed at 06/10/2017 1200 Gross per 24 hour  Intake 1320 ml  Output 1925 ml  Net -605 ml     Wt Readings from Last 3 Encounters:  06/10/17 77.9 kg (171 lb 11.8 oz)  03/30/17 79.4 kg (175 lb)  03/11/17 79.4 kg (175 lb)     Exam  General: Alert and oriented x 3, NAD  Eyes:  HEENT: JVP +  Cardiovascular: S1 S2 auscultated, no rubs, murmurs or gallops. Regular rate and rhythm.  Respiratory: Clear to auscultation bilaterally, no wheezing, rales or rhonchi  Gastrointestinal: Soft, nontender, nondistended, + bowel sounds  Ext: no pedal edema bilaterally  Neuro: no neuro deficit  Musculoskeletal: No digital cyanosis, clubbing  Skin: No rashes  Psych: Normal affect and demeanor, alert and oriented x3    Data Reviewed:  I have personally reviewed following labs and imaging studies  Micro Results No results found for this or any previous visit (from the past 240 hour(s)).  Radiology Reports Dg Chest 2 View  Result Date: 06/06/2017 CLINICAL DATA:  Daughter stated, He is been sleeping for about 23 hours a day will go to the BR and then go back to bed. Eating but not as much as usual. Has Hx of copd,cad.ckd EXAM: CHEST  2 VIEW COMPARISON:  12/08/2016 FINDINGS: Prior median sternotomy. Pacer/AICD device. Mitral valve replacement. Midline trachea. Moderate cardiomegaly. Atherosclerosis in the transverse aorta. No right and no definite left pleural fluid. Remote right rib trauma. Moderate pulmonary interstitial prominence and indistinctness, progressive. Left pleural calcifications. Mild right hemidiaphragm elevation. IMPRESSION: Cardiomegaly with progressive pulmonary interstitial prominence. Favored to represent mild pulmonary venous congestion,  superimposed upon the sequelae of smoking/ chronic bronchitis. Aortic Atherosclerosis (ICD10-I70.0). Electronically Signed   By: Jeronimo Greaves M.D.   On: 06/06/2017 13:53    Lab Data:  CBC: Recent Labs  Lab 06/06/17 1037 06/08/17 0331 06/09/17 0255 06/10/17 0404  WBC 11.5* 10.1 11.3* 11.2*  HGB 12.9* 11.8* 11.8* 11.1*  HCT 41.6 39.0 39.2 38.9*  MCV 93.5 96.1 95.4 95.8  PLT 229 199 214 210   Basic Metabolic Panel: Recent Labs  Lab 06/06/17 1037 06/07/17 0415 06/08/17 0331 06/09/17 0255 06/10/17 0404  NA 139 141 138 139 141  K 3.7 3.6  4.6 5.1 5.0  CL 95* 98* 98* 100* 101  CO2 32 33* 34* 31 36*  GLUCOSE 231* 154* 162* 147* 137*  BUN 22* 23* 41* 43* 32*  CREATININE 1.28* 1.18 2.03* 1.54* 1.28*  CALCIUM 9.8 9.4 8.9 9.3 9.3   GFR: Estimated Creatinine Clearance: 50.9 mL/min (A) (by C-G formula based on SCr of 1.28 mg/dL (H)). Liver Function Tests: No results for input(s): AST, ALT, ALKPHOS, BILITOT, PROT, ALBUMIN in the last 168 hours. No results for input(s): LIPASE, AMYLASE in the last 168 hours. Recent Labs  Lab 06/09/17 1043  AMMONIA 48*   Coagulation Profile: No results for input(s): INR, PROTIME in the last 168 hours. Cardiac Enzymes: Recent Labs  Lab 06/06/17 1613 06/07/17 0742 06/07/17 1312 06/07/17 1914  TROPONINI 0.06* 0.06* 0.05* 0.05*   BNP (last 3 results) Recent Labs    05/25/17 1152  PROBNP 1,108*   HbA1C: No results for input(s): HGBA1C in the last 72 hours. CBG: Recent Labs  Lab 06/09/17 1235 06/09/17 1610 06/09/17 2148 06/10/17 0746 06/10/17 1205  GLUCAP 167* 186* 274* 152* 199*   Lipid Profile: No results for input(s): CHOL, HDL, LDLCALC, TRIG, CHOLHDL, LDLDIRECT in the last 72 hours. Thyroid Function Tests: No results for input(s): TSH, T4TOTAL, FREET4, T3FREE, THYROIDAB in the last 72 hours. Anemia Panel: No results for input(s): VITAMINB12, FOLATE, FERRITIN, TIBC, IRON, RETICCTPCT in the last 72 hours. Urine analysis:      Component Value Date/Time   COLORURINE YELLOW 06/06/2017 1542   APPEARANCEUR CLEAR 06/06/2017 1542   LABSPEC 1.015 06/06/2017 1542   PHURINE 7.0 06/06/2017 1542   GLUCOSEU >=500 (A) 06/06/2017 1542   HGBUR NEGATIVE 06/06/2017 1542   BILIRUBINUR NEGATIVE 06/06/2017 1542   KETONESUR NEGATIVE 06/06/2017 1542   PROTEINUR NEGATIVE 06/06/2017 1542   UROBILINOGEN 0.2 12/24/2014 0311   NITRITE NEGATIVE 06/06/2017 1542   LEUKOCYTESUR NEGATIVE 06/06/2017 1542     Ripudeep Rai M.D. [facility] 06/10/2017, 1:11 PM  Pager: [phone] Between 7am to 7pm - call Pager - [phone]  After 7pm go to [email or web address] - password [email or web address]  Call night coverage person covering after 7pm

## 2017-06-10 NOTE — Consult Note (Signed)
Cardiology Consultation:   Patient ID: [name]; [id number]; [birth year]   Admit date: 06/06/2017 Date of Consult: 06/10/2017  Primary Care Provider: Georgann Housekeeper, MD Primary Cardiologist: Dr Shirlee Latch Primary Electrophysiologist:  Dr Ladona Ridgel   Patient Profile:   [name] is a 72 y.o. male with a hx of permanent atrial fibrillation, mitral valve replacement x2, and coronary artery disease who is being seen today for the evaluation of severe left main disease at the request of Dr. Shirlee Latch.  History of Present Illness:   [name] has an extensive cardiac history.  He is interviewed this morning at the bedside, no family present.  The patient has had progressive symptoms of heart failure.  He also has severe COPD on home oxygen and continues to smoke about 2 packs of cigarettes per day.  His LVEF is 25-30%.  The patient's heart failure has become more difficult to manage.  He recently presented with volume overload but sustained acute kidney injury with diuresis.  He underwent right and left heart catheterization yesterday to evaluate for progressive coronary disease and also to better understand his hemodynamic status.  This demonstrated preserved cardiac output, moderately elevated LVEDP, severe distal left mainstem stenosis, chronic total occlusion of the right coronary artery, and severe stenosis into the ostium of the left circumflex.  The patient denies any symptoms at present.  He is a quite difficult historian.  He denies any history of anginal chest pain.  He denies shortness of breath but states he really does not do any physical activity.  He does not go out of the house other than to doctor's visits.  Denies lightheadedness or syncope.  In reviewing his history, he has been noncompliant with medical therapy but is now living with his daughter and sounds like he has been taking his medications.  Despite the fact that he denies shortness of breath today, in reviewing notes he is  admitted for progressive shortness of breath, fatigue, and weight gain.  It appears he has nearly been bedbound over the recent weeks.    Past Medical History:  Diagnosis Date  . AICD (automatic cardioverter/defibrillator) present   . Alcohol abuse   . Anxiety   . Bipolar disorder (HCC)   . Biventricular automatic implantable cardioverter defibrillator in situ    a. 01/2014 s/p MDT [id number] Viva XT CRT-D (ser # [id number]).  . CAD (coronary artery disease)    a. s/p prior PCI/stenting of the LCX;  b. 08/2014 MV: EF 20%, large septal, apical, and inferior infarct from apex to base, no ischemia;  c. 08/2014 NSTEMI/Cath: LM mod distal dzs extending into ostial LCX (80%), LAD tortuous, RI nl, OM mod dzs, RCA 100 CTO with R->R and L->R collats-->Med Rx.  . Chronic atrial fibrillation (HCC)    a. CHA2DS2VASc = 6->eliquis;  b. S/P AVN RFCA an BiV ICD placement.  . Chronic respiratory failure (HCC)   . Chronic systolic congestive heart failure (HCC)    a. 01/2014 Echo: EF 25-30%, mod conc LVH, mod dil LA.  . CKD (chronic kidney disease)    a. suspect stage II-III based on historical labs.  . Complete heart block (HCC)    a. In setting of prior AV nodal ablation r/t afib-->BiV ICD (01/2014).  . COPD (chronic obstructive pulmonary disease) (HCC)   . CVA (cerebral vascular accident) (HCC)    a. Multiple prior embolic strokes.  . Dementia   . Depression   . DVT (deep venous thrombosis) (HCC)   . Dyslipidemia   .  Hyperglycemia   . Hypertension   . Mitral valve disease    a. remote mitral replacement with Bjork Shiley valve;  b. 06/2006 Redo MVR with tissue valve.  . Mixed Ischemic and Nonischemic Cardiomyopathy    a. 8/.2015 Echo: Ef 25-30%;  b. 01/2014 s/p MDT [id number] Talbot Grumbling XT CRT-D (ser # [id number]).  . On home oxygen therapy    "3L at night" (11/23/2015)  . Psoriasis    "back of his head; elbows; finger of left hand" (06/07/2017)  . Ventral hernia     Past Surgical History:  Procedure Laterality  Date  . AV NODE ABLATION  2007  . CARDIAC CATHETERIZATION  06/2006   Mild ostial L main stenosis, CFX stent patent, RCA occluded (old)  . CORONARY ARTERY BYPASS GRAFT  2008  . CYSTOSCOPY W/ URETERAL STENT PLACEMENT  07/12/2011   Procedure: CYSTOSCOPY WITH RETROGRADE PYELOGRAM/URETERAL STENT PLACEMENT;  Surgeon: Sebastian Ache, MD;  Location: [facility] OR;  Service: Urology;  Laterality: Left;  . HERNIA REPAIR    . ICD GENERATOR CHANGE  02/01/2014   Gen change to: Medtronic VIVA pulse generator, serial number [id number]  . INSERT / REPLACE / REMOVE PACEMAKER    . LEFT HEART CATHETERIZATION WITH CORONARY ANGIOGRAM N/A 08/23/2014   Procedure: LEFT HEART CATHETERIZATION WITH CORONARY ANGIOGRAM;  Surgeon: Corky Crafts, MD;  Location: [facility] CATH LAB;  Service: Cardiovascular;  Laterality: N/A;  . MITRAL VALVE REPLACEMENT     remote [facility] valve 5621 with redo tissue valve 06/2006  . PACEMAKER GENERATOR CHANGE N/A 02/01/2014   Procedure: PACEMAKER GENERATOR CHANGE;  Surgeon: Duke Salvia, MD;  Location: [facility] CATH LAB;  Service: Cardiovascular;  Laterality: N/A;  . PACEMAKER PLACEMENT  2006   Changed to CRT-D in 2009  . RIGHT/LEFT HEART CATH AND CORONARY ANGIOGRAPHY N/A 06/09/2017   Procedure: RIGHT/LEFT HEART CATH AND CORONARY ANGIOGRAPHY;  Surgeon: [name], MD;  Location: [facility] INVASIVE CV LAB;  Service: Cardiovascular;  Laterality: N/A;     Home Medications:  Prior to Admission medications   Medication Sig Start Date End Date Taking? Authorizing Provider  apixaban (ELIQUIS) 5 MG TABS tablet Take 1 tablet (5 mg total) by mouth 2 (two) times daily. 02/12/17  Yes Little Ishikawa, NP  atorvastatin (LIPITOR) 40 MG tablet Take 1 tablet (40 mg total) by mouth daily. 05/13/16  Yes Clegg, Amy D, NP  carvedilol (COREG) 6.25 MG tablet Take 6.25 mg (1 tab) in am and 9.375 mg (1.5 tabs) in pm Patient taking differently: 6.25 mg 2 (two) times daily with a meal. Take 6.25 mg (1 tab) in am and 9.375 mg (1.5  tabs) in pm 04/14/17  Yes Clegg, Amy D, NP  fenofibrate (TRICOR) 48 MG tablet TAKE 1 TABLET BY MOUTH EVERY DAY 02/16/17  Yes [name], MD  Fexofenadine HCl (ALLERGY 24-HR PO) Take 1 tablet by mouth daily.   Yes [provider]  glipiZIDE (GLUCOTROL) 5 MG tablet Take 5 mg daily before breakfast by mouth.   Yes [provider]  Multiple Vitamin (MULTIVITAMIN WITH MINERALS) TABS tablet Take 1 tablet by mouth daily. 05/24/15  Yes Osvaldo Shipper, MD  OXYGEN Inhale 2.5 L into the lungs.   Yes [provider]  ranitidine (ZANTAC) 150 MG tablet Take 150 mg by mouth daily as needed for heartburn.   Yes [provider]  sacubitril-valsartan (ENTRESTO) 49-51 MG Take 1 tablet by mouth 2 (two) times daily. 02/12/17  Yes Little Ishikawa, NP  spironolactone (  ALDACTONE) 25 MG tablet Take 0.5 tablets (12.5 mg total) by mouth daily. 01/29/17  Yes [name], MD  thiamine 100 MG tablet Take 1 tablet (100 mg total) by mouth daily. 05/24/15  Yes Osvaldo Shipper, MD  torsemide (DEMADEX) 20 MG tablet Take 3 tablets (60 mg total) by mouth daily. 05/25/17  Yes Seiler, Amber K, NP  ELIQUIS 5 MG TABS tablet TAKE 1 TABLET BY MOUTH TWICE A DAY 06/09/17   [name], MD  nitroGLYCERIN (NITROSTAT) 0.4 MG SL tablet Place 1 tablet (0.4 mg total) under the tongue every 5 (five) minutes as needed for chest pain. 01/06/15   Allayne Butcher, PA-C    Inpatient Medications: Scheduled Meds: . apixaban  2.5 mg Oral BID  . atorvastatin  40 mg Oral Daily  . carvedilol  6.25 mg Oral Q breakfast  . carvedilol  9.375 mg Oral Q supper  . famotidine  20 mg Oral BID  . fenofibrate  54 mg Oral Daily  . furosemide  80 mg Intravenous BID  . insulin aspart  0-5 Units Subcutaneous QHS  . insulin aspart  0-9 Units Subcutaneous TID WC  . loratadine  10 mg Oral Daily  . mouth rinse  15 mL Mouth Rinse BID  . multivitamin with minerals  1 tablet Oral Daily  . nicotine  21 mg Transdermal Daily    . sodium chloride flush  3 mL Intravenous Q12H  . sodium chloride flush  3 mL Intravenous Q12H  . thiamine  100 mg Oral Daily   Continuous Infusions: . sodium chloride    . sodium chloride     PRN Meds: sodium chloride, sodium chloride, acetaminophen, guaiFENesin-dextromethorphan, naLOXone (NARCAN)  injection, ondansetron (ZOFRAN) IV, sodium chloride flush, sodium chloride flush  Allergies:    Allergies  Allergen Reactions  . Warfarin Other (See Comments)    Non compliance and ETOH abuse    Social History:   Social History   Socioeconomic History  . Marital status: Divorced    Spouse name: Not on file  . Number of children: Not on file  . Years of education: Not on file  . Highest education level: Not on file  Social Needs  . Financial resource strain: Not on file  . Food insecurity - worry: Not on file  . Food insecurity - inability: Not on file  . Transportation needs - medical: Not on file  . Transportation needs - non-medical: Not on file  Occupational History  . Occupation: Retired Therapist, music  Tobacco Use  . Smoking status: Current Every Day Smoker    Packs/day: 2.00    Years: 42.00    Pack years: 84.00    Types: Cigarettes  . Smokeless tobacco: Never Used  Substance and Sexual Activity  . Alcohol use: Yes    Alcohol/week: 0.0 oz    Comment: 11/22/2015 "6 pack of beer/month"  . Drug use: No  . Sexual activity: Not Currently  Other Topics Concern  . Not on file  Social History Narrative   ** Merged History Encounter **        Family History:   Family History  Problem Relation Age of Onset  . Alzheimer's disease Mother   . Heart attack Father   . Heart disease Father   . Heart attack Son   . Varicose Veins Son   . Deep vein thrombosis Son   . Heart disease Son   . Stroke Unknown   . Heart disease Unknown   . Heart  disease Brother      ROS:  Please see the history of present illness.  ROS  All other ROS reviewed and negative.     Physical  Exam/Data:   Vitals:   06/09/17 1535 06/09/17 2150 06/10/17 0500 06/10/17 0635  BP: 118/71 110/62 119/72 116/64  Pulse: (!) 58 60 (!) 59 (!) 59  Resp:  18 16   Temp:  98.1 F (36.7 C) 97.7 F (36.5 C)   TempSrc:  Oral Axillary   SpO2: 93% 93% 91% 95%  Weight:   171 lb 11.8 oz (77.9 kg)   Height:        Intake/Output Summary (Last 24 hours) at 06/10/2017 0823 Last data filed at 06/10/2017 0606 Gross per 24 hour  Intake 600 ml  Output 1050 ml  Net -450 ml   Filed Weights   06/09/17 0046 06/09/17 0535 06/10/17 0500  Weight: 171 lb 11.8 oz (77.9 kg) 171 lb 11.2 oz (77.9 kg) 171 lb 11.8 oz (77.9 kg)   Body mass index is 26.9 kg/m.  General: Elderly appearing male, resting comfortably in bed in no distress HEENT: normal Lymph: no adenopathy Neck: JVP moderately elevated Endocrine:  No thryomegaly Vascular: FA pulses 2+ bilaterally, calcified arteries by palpation Cardiac:  normal S1, S2; RRR Lungs:  clear to auscultation bilaterally, no wheezing, rhonchi or rales  Abd: soft, nontender, no hepatomegaly  Ext: no edema, right radial cath site is clear Musculoskeletal:  No deformities, BUE and BLE strength normal and equal Skin: warm and dry  Neuro:  CNs 2-12 intact, no focal abnormalities noted Psych:  Normal affect   EKG:  The EKG was personally reviewed and demonstrates: Biventricular pacing  Relevant CV Studies: 2D echocardiogram: Study Conclusions  - Left ventricle: The cavity size was moderately dilated. There was   mild concentric hypertrophy. Systolic function was severely   reduced. The estimated ejection fraction was in the range of 25%   to 30%. Global hypokinesis worse in the anterolateral,   inferolateral, inferior, and inferoseptal myocardium. Features   are consistent with a pseudonormal left ventricular filling   pattern, with concomitant abnormal relaxation and increased   filling pressure (grade 2 diastolic dysfunction). Doppler   parameters are  consistent with high ventricular filling pressure. - Aortic valve: Transvalvular velocity was within the normal range.   There was no stenosis. There was no regurgitation. - Mitral valve: A bioprosthesis was present. Transvalvular velocity   was within the normal range. There was no evidence for stenosis.   There was no regurgitation. - Left atrium: The atrium was moderately dilated. - Right ventricle: The cavity size was normal. Wall thickness was   normal. Systolic function was moderately reduced. - Tricuspid valve: There was mild regurgitation. - Pulmonary arteries: Systolic pressure was within the normal   range. PA peak pressure: 27 mm Hg (S). - Pericardium, extracardiac: A trivial pericardial effusion was   identified.  Laboratory Data:  Chemistry Recent Labs  Lab 06/08/17 0331 06/09/17 0255 06/10/17 0404  NA 138 139 141  K 4.6 5.1 5.0  CL 98* 100* 101  CO2 34* 31 36*  GLUCOSE 162* 147* 137*  BUN 41* 43* 32*  CREATININE 2.03* 1.54* 1.28*  CALCIUM 8.9 9.3 9.3  GFRNONAA 32* 44* 55*  GFRAA 37* 51* >60  ANIONGAP 6 8 4*    No results for input(s): PROT, ALBUMIN, AST, ALT, ALKPHOS, BILITOT in the last 168 hours. Hematology Recent Labs  Lab 06/08/17 1610 06/09/17 0255 06/10/17 0404  WBC 10.1 11.3* 11.2*  RBC 4.06* 4.11* 4.06*  HGB 11.8* 11.8* 11.1*  HCT 39.0 39.2 38.9*  MCV 96.1 95.4 95.8  MCH 29.1 28.7 27.3  MCHC 30.3 30.1 28.5*  RDW 14.5 14.5 14.3  PLT 199 214 210   Cardiac Enzymes Recent Labs  Lab 06/06/17 1613 06/07/17 0742 06/07/17 1312 06/07/17 1914  TROPONINI 0.06* 0.06* 0.05* 0.05*   No results for input(s): TROPIPOC in the last 168 hours.  BNP Recent Labs  Lab 06/06/17 1037  BNP 486.4*    DDimer No results for input(s): DDIMER in the last 168 hours.  Radiology/Studies:  Dg Chest 2 View  Result Date: 06/06/2017 CLINICAL DATA:  Daughter stated, He is been sleeping for about 23 hours a day will go to the BR and then go back to bed. Eating  but not as much as usual. Has Hx of copd,cad.ckd EXAM: CHEST  2 VIEW COMPARISON:  12/08/2016 FINDINGS: Prior median sternotomy. Pacer/AICD device. Mitral valve replacement. Midline trachea. Moderate cardiomegaly. Atherosclerosis in the transverse aorta. No right and no definite left pleural fluid. Remote right rib trauma. Moderate pulmonary interstitial prominence and indistinctness, progressive. Left pleural calcifications. Mild right hemidiaphragm elevation. IMPRESSION: Cardiomegaly with progressive pulmonary interstitial prominence. Favored to represent mild pulmonary venous congestion, superimposed upon the sequelae of smoking/ chronic bronchitis. Aortic Atherosclerosis (ICD10-I70.0). Electronically Signed   By: Jeronimo Greaves M.D.   On: 06/06/2017 13:53    Assessment and Plan:   This is a 72 year old male with severe distal left main stenosis in the context of multiple severe comorbid conditions including O2 dependent COPD, ongoing heavy tobacco use, acute on chronic systolic heart failure with 2 previous sternotomies for mitral valve surgery, and peripheral arterial disease.  The patient is a difficult historian and may have a component of underlying dementia.  I have personally reviewed his echo and cardiac catheterization studies.  He has severe LV systolic dysfunction possibly related to a combination of ischemic heart disease, mitral valve disease, and alcohol.  His cardiac catheterization demonstrates severe distal left main stenosis, chronic occlusion of the RCA, and patency of the LAD and left circumflex, but possibly with ostial involvement of both vessels.  The distal left main bifurcation is very difficult to lay out because of tortuosity as the major branch vessels arise from the left main.  In reviewing his previous heart catheterization from 2016, there may be some progression of left main disease but he did appear to have significant angiographic left main disease at that time.   Intravascular ultrasound demonstrated a minimal lumen area greater than 6 mm and he was managed medically.  While I think it would be technically feasible to perform atherectomy and stenting of the left main into the LAD, this procedure would clearly comment significant procedural risk of myocardial infarction, hemodynamic instability, and even death.  Risk is increased in a patient with a chronically occluded right coronary artery were all of his coronary circulation arises from the left mainstem.  An arterial Doppler was obtained yesterday to try to evaluate whether he might be a candidate for hemodynamic support.  He appears to have significant iliac disease bilaterally with a high likelihood that he would not have anatomic suitability for a 14 French sheath required for an Impella support.  Considering all these factors, I would favor ongoing palliative medical therapy for this patient's coronary artery disease.   For questions or updates, please contact [facility] Please consult [email or web address] for contact info under Cardiology/STEMI.   Signed,  Tonny Bollman, MD  06/10/2017 8:23 AM

## 2017-06-10 NOTE — Evaluation (Signed)
Physical Therapy Evaluation Patient Details Name: [name] MRN: [id number] DOB: 10/18/1945 Today's Date: 06/10/2017   History of Present Illness  Pt admitted with CHF exacerbation. PMH: CAD, afib, COPD (2ppd smoker), dementia, CKD, MVR x2, Bipolar, alcohol abuse.   Clinical Impression  Pt is at or close to baseline functioning and should be safe at home with available assist. There are no further acute PT needs.  Will sign off at this time.     Follow Up Recommendations No PT follow up    Equipment Recommendations  None recommended by PT    Recommendations for Other Services       Precautions / Restrictions Precautions Precautions: None Restrictions Weight Bearing Restrictions: No      Mobility  Bed Mobility Overal bed mobility: Modified Independent                Transfers Overall transfer level: Needs assistance   Transfers: Sit to/from Stand Sit to Stand: Supervision            Ambulation/Gait Ambulation/Gait assistance: Supervision Ambulation Distance (Feet): 300 Feet Assistive device: None Gait Pattern/deviations: Step-through pattern Gait velocity: slower Gait velocity interpretation: Below normal speed for age/gender General Gait Details: generally steady overall even when scanning around.  Pt HOH and not hearing my commands enough to show me how he dealt with more complex balance challenges.  He will be safe in a home-like Designer, fashion/clothing [employer]in (Stroke Patients Only)       Balance Overall balance assessment: Needs assistance   Sitting balance-Leahy Scale: Good       Standing balance-Leahy Scale: Good                               Pertinent Vitals/Pain Pain Assessment: No/denies pain    Home Living Family/patient expects to be discharged to:: Private residence Living Arrangements: Children Available Help at Discharge: Family Type of Home: House Home  Access: Stairs to enter Entrance Stairs-Rails: Right Entrance Stairs-Number of Steps: 3 Home Layout: One level Home Equipment: Environmental consultant - 2 wheels;Other (comment);Shower seat;Electric scooter(02--2L)      Prior Function Level of Independence: Independent with assistive device(s)         Comments: does not walk with device in home, uses scooter outside, sits to shower     Hand Dominance   Dominant Hand: Left    Extremity/Trunk Assessment   Upper Extremity Assessment Upper Extremity Assessment: Overall WFL for tasks assessed    Lower Extremity Assessment Lower Extremity Assessment: Overall WFL for tasks assessed       Communication   Communication: HOH  Cognition Arousal/Alertness: Awake/alert Behavior During Therapy: WFL for tasks assessed/performed Overall Cognitive Status: History of cognitive impairments - at baseline                                 General Comments: poor historian      General Comments      Exercises     Assessment/Plan    PT Assessment Patent does not need any further PT services  PT Problem List         PT Treatment Interventions      PT Goals (Current goals can be found in the Care Plan section)  Acute Rehab PT Goals PT Goal  Formulation: All assessment and education complete, DC therapy    Frequency     Barriers to discharge        Co-evaluation               AM-PAC PT "6 Clicks" Daily Activity  Outcome Measure Difficulty turning over in bed (including adjusting bedclothes, sheets and blankets)?: None Difficulty moving from lying on back to sitting on the side of the bed? : None Difficulty sitting down on and standing up from a chair with arms (e.g., wheelchair, bedside commode, etc,.)?: None Help needed moving to and from a bed to chair (including a wheelchair)?: None Help needed walking in hospital room?: A Little Help needed climbing 3-5 steps with a railing? : A Little 6 Click Score: 22    End  of Session   Activity Tolerance: Patient tolerated treatment well Patient left: in chair;with call bell/phone within reach Nurse Communication: Mobility status PT Visit Diagnosis: Unsteadiness on feet (R26.81)    Time: 1027-2536 PT Time Calculation (min) (ACUTE ONLY): 13 min   Charges:   PT Evaluation $PT Eval Moderate Complexity: 1 Mod     PT G Codes:        12-Jun-2017  Munford Bing, PT [phone]  (pager)  Eliseo Gum Chalmers Iddings Jun 12, 2017, 4:38 PM

## 2017-06-10 NOTE — Progress Notes (Signed)
Patient ID: [name], male   DOB: 05-06-1946, 72 y.o.   MRN: [id number]     Advanced Heart Failure Rounding Note  Primary Cardiologist: Shirlee Latch  Subjective:    Feeling OK this am. Spoke with Dr. Excell Seltzer.  Understands no good option for PCI with PAD and complicated CAD. Denies CP.   Of note, very sedated with minimal meds at cath yesterday, required Narcan.   VAS US Aorta/IVC/Iliacs 06/09/17 Abdominal Aorta: Unable to visualize due to overlying bowel gas. IVC/Iliac: Elevated velocities suggest possible 30-49% right common iliac artery stenosis and 50-74% bilateral external iliac artery stenosis.  LHC/RHC 06/09/17 Left Main  90% stenosis distal left main just prior to bifurcation to LAD and LCx. Tortuous proximal LCx.  Left Anterior Descending  40% mid LAD stenosis.  Left Circumflex  50% stenosis proximal PLOM.  Right Coronary Artery  Totally occluded proximal RCA with left to right collaterals. This was similar to prior study.  RHC Procedural Findings: Hemodynamics (mmHg) RA mean 12 RV 64/14 PA 65/23, mean 39 PCWP mean 23 LV 92/22 AO 101/57  Oxygen saturations: PA 64% AO 96%  Cardiac Output (Fick) 4.9  Cardiac Index (Fick) 2.6 PVR 3.2 WU   Objective:   Weight Range: 171 lb 11.8 oz (77.9 kg) Body mass index is 26.9 kg/m.   Vital Signs:   Temp:  [97.7 F (36.5 C)-98.1 F (36.7 C)] 97.7 F (36.5 C) (01/03 0500) Pulse Rate:  [0-65] 59 (01/03 0635) Resp:  [0-69] 16 (01/03 0500) BP: (104-137)/(59-81) 116/64 (01/03 0635) SpO2:  [0 %-98 %] 95 % (01/03 0635) Weight:  [171 lb 11.8 oz (77.9 kg)] 171 lb 11.8 oz (77.9 kg) (01/03 0500) Last BM Date: 06/09/17  Weight change: Filed Weights   06/09/17 0046 06/09/17 0535 06/10/17 0500  Weight: 171 lb 11.8 oz (77.9 kg) 171 lb 11.2 oz (77.9 kg) 171 lb 11.8 oz (77.9 kg)    Intake/Output:   Intake/Output Summary (Last 24 hours) at 06/10/2017 0814 Last data filed at 06/10/2017 0606 Gross per 24 hour  Intake 600 ml  Output  1050 ml  Net -450 ml      Physical Exam    General: NAD HEENT: Normal Neck: Supple. JVP at least 8-9 cm. Carotids 2+ bilat; no bruits. No thyromegaly or nodule noted. Cor: PMI nondisplaced. RRR, No M/G/R noted Lungs: CTAB, normal effort. Abdomen: Soft, non-tender, non-distended, no HSM. No bruits or masses. +BS  Extremities: No cyanosis, clubbing, or rash. R and LLE no edema.  Neuro: Alert & orientedx3, cranial nerves grossly intact. moves all 4 extremities w/o difficulty. Affect pleasant   Telemetry   Afib with BiV pacing, personally reviewed.   Labs    CBC Recent Labs    06/09/17 0255 06/10/17 0404  WBC 11.3* 11.2*  HGB 11.8* 11.1*  HCT 39.2 38.9*  MCV 95.4 95.8  PLT 214 210   Basic Metabolic Panel Recent Labs    40/98/11 0255 06/10/17 0404  NA 139 141  K 5.1 5.0  CL 100* 101  CO2 31 36*  GLUCOSE 147* 137*  BUN 43* 32*  CREATININE 1.54* 1.28*  CALCIUM 9.3 9.3   Liver Function Tests No results for input(s): AST, ALT, ALKPHOS, BILITOT, PROT, ALBUMIN in the last 72 hours. No results for input(s): LIPASE, AMYLASE in the last 72 hours. Cardiac Enzymes Recent Labs    06/07/17 1312 06/07/17 1914  TROPONINI 0.05* 0.05*    BNP: BNP (last 3 results) Recent Labs    01/27/17 1046 02/01/17 1054  06/06/17 1037  BNP 136.7* 102.6* 486.4*    ProBNP (last 3 results) Recent Labs    05/25/17 1152  PROBNP 1,108*     D-Dimer No results for input(s): DDIMER in the last 72 hours. Hemoglobin A1C No results for input(s): HGBA1C in the last 72 hours. Fasting Lipid Panel No results for input(s): CHOL, HDL, LDLCALC, TRIG, CHOLHDL, LDLDIRECT in the last 72 hours. Thyroid Function Tests No results for input(s): TSH, T4TOTAL, T3FREE, THYROIDAB in the last 72 hours.  Invalid input(s): FREET3  Other results:   Imaging   No results found.  Medications:     Scheduled Medications: . apixaban  2.5 mg Oral BID  . atorvastatin  40 mg Oral Daily  .  carvedilol  6.25 mg Oral Q breakfast  . carvedilol  9.375 mg Oral Q supper  . famotidine  20 mg Oral BID  . fenofibrate  54 mg Oral Daily  . furosemide  80 mg Intravenous BID  . insulin aspart  0-5 Units Subcutaneous QHS  . insulin aspart  0-9 Units Subcutaneous TID WC  . loratadine  10 mg Oral Daily  . mouth rinse  15 mL Mouth Rinse BID  . multivitamin with minerals  1 tablet Oral Daily  . nicotine  21 mg Transdermal Daily  . sodium chloride flush  3 mL Intravenous Q12H  . sodium chloride flush  3 mL Intravenous Q12H  . thiamine  100 mg Oral Daily    Infusions: . sodium chloride    . sodium chloride      PRN Medications: sodium chloride, sodium chloride, acetaminophen, guaiFENesin-dextromethorphan, naLOXone (NARCAN)  injection, ondansetron (ZOFRAN) IV, sodium chloride flush, sodium chloride flush    Patient Profile   [name] is a 72 y.o. 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    He was admitted on 12/30 with acute on chronic systolic and diastolic heart failure exacerbation.   Assessment/Plan   1. 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  MDT CRT-D. Still an active drinker. He was admitted with increased dyspnea, poor exercise tolerance. He has looked volume overloaded on exam and IV Lasix was started.  Initially, he did not diurese well and creatinine increased from 1.1 to 2 and diuretics held.  This was somewhat surprising.   - Creatinine down to 1.3.  - Volume status elevated with relatively preserved CO on cath 06/09/16. - Started back on IV lasix post cath. Continue lasix 80 mg IV BID. May need metolazone (Could tolerated now with creatinine improved). Will discuss with MD.  - Can continue Coreg for now. -  Needs ETOH cessation.  2. CAD: No chest pain.  Mild troponin increase with no trend, likely demand ischemia related to volume overload. Last cath in 2008 with medical management but with moderate left main stenosis.   - Creatinine continues to trend down.  - LHC yesterday with 90% Left main. Dr Excell Seltzer has seen and we agree that it is too high risk with RCA blockage and PAD preventing Impella use.  - Continue statin, no ASA given Eliquis use.  3. Atrial fibrillation: Chronic.  Had AV nodal ablation, now BiV pacing. May need to hold Eliquis  for PCI . 4. COPD: Still active smoker, 2 ppd.  On home oxygen.  -  Continue oxygen per [place] - Needs to quit smoking. No change.  5. ETOH: Ongoing abuse. Needs to stop completely.  6. MV replacement: Bioprosthetic.  Looked ok on last echo.  7. AKI:  - Resolved. ? How he was taking his meds at home, though daughter states he has been compliant.  - Continue to follow closely.  8. Dementia:  - Suspect early dementia. No change.   Length of Stay: 4  Graciella Freer, [place]  06/10/2017, 8:14 AM  Advanced Heart Failure Team Pager [phone] (M-F; 7a - 4p)  Please contact [facility] for night-coverage after hours (4p -7a ) and weekends on [email or web address]  Patient seen with PA, agree with the above note.   1. 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 Echo 10/18 EF 25-30%.  MDT CRT-D. Still an active drinker. He was admitted with increased dyspnea, poor exercise tolerance. He has looked volume overloaded on exam and IV Lasix was started.  RHC yesterday showed preserved cardiac output but elevated filling pressures (though not markedly elevated).  Creatinine down to 1.28 today.  Still some volume overload on exam.  - Lasix 80 mg IV bid with dose of metolazone 2.5 mg x 1 today.  - Can increase Coreg to 9.375 mg bid.  - Off spironolactone with AKI and elevated K, off Entresto with recent AKI.  If creatinine remains  stable tomorrow, will restart losartan at low dose and titrate up. Eventually would like to re-try probably lower dose Entresto.  - Needs ETOH cessation.  2. CAD: No chest pain. Mild troponin increase with no trend, likely demand ischemia related to volume overload. Coronary angiogram showed chronically occluded RCA with 90% distal left main stenosis.  Appreciate Dr. Earmon Phoenix consult.  Patient is not a candidate for CABG with 2 prior sternotomies, severe COPD, and mild dementia.  Intervention on left main is technically feasible but with low EF and chronically occluded RCA, would need Impella support.  He has significant bilateral PAD on dopplers this admission, probably would not be able to place a femoral Impella.  Risk of non-protected intervention would be significant, and he is not particularly mobile at home with early dementia.  Review of prior cath in 2016 showed that left main stenosis is pretty similar to 3 years ago.  Therefore, after discussion we have elected for continued medical management.  - Continue statin, no ASA given Eliquis use.  3. Atrial fibrillation: Chronic. Had AV nodal ablation, now BiV pacing.  - Continue Eliquis, increase to 5 mg bid dosing.  4. COPD: Still active smoker, 2 ppd. On home oxygen.  - Continue oxygen per [place] - Needs to quit smoking.  5. ETOH: Ongoing abuse. Needs to stop => daughter says that he has cut back a lot.  6. MV replacement: Bioprosthetic. Looked ok on last echo.  7. AKI: Resolved, holding Entresto and will diurese more aggresively today.  8. Dementia: Suspect early dementia.   Marca Ancona 06/10/2017 9:25 AM

## 2017-06-10 NOTE — Evaluation (Signed)
Occupational Therapy Evaluation Patient Details Name: [name] MRN: [id number] DOB: 1946-02-09 Today's Date: 06/10/2017    History of Present Illness Pt admitted with CHF exacerbation. PMH: CAD, afib, COPD (2ppd smoker), dementia, CKD, MVR x2, Bipolar, alcohol abuse.    Clinical Impression   Pt is functioning at supervision level in ADL due to impaired cognition. He lives with his daughter who provides assist as needed. No further OT needs.    Follow Up Recommendations  No OT follow up;Supervision/Assistance - 24 hour    Equipment Recommendations  None recommended by OT    Recommendations for Other Services       Precautions / Restrictions Restrictions Weight Bearing Restrictions: No      Mobility Bed Mobility Overal bed mobility: Modified Independent                Transfers Overall transfer level: Needs assistance   Transfers: Sit to/from Stand Sit to Stand: Supervision              Balance                                           ADL either performed or assessed with clinical judgement   ADL Overall ADL's : At baseline                                       General ADL Comments: pt functioning at supervision level for safety due to cognition     Vision Baseline Vision/History: Wears glasses Wears Glasses: At all times Patient Visual Report: No change from baseline       Perception     Praxis      Pertinent Vitals/Pain Pain Assessment: No/denies pain     Hand Dominance Left   Extremity/Trunk Assessment Upper Extremity Assessment Upper Extremity Assessment: Overall WFL for tasks assessed   Lower Extremity Assessment Lower Extremity Assessment: Overall WFL for tasks assessed       Communication Communication Communication: HOH   Cognition Arousal/Alertness: Awake/alert Behavior During Therapy: WFL for tasks assessed/performed Overall Cognitive Status: History of cognitive impairments -  at baseline                                 General Comments: poor historian   General Comments       Exercises     Shoulder Instructions      Home Living Family/patient expects to be discharged to:: Private residence Living Arrangements: Children Available Help at Discharge: Family Type of Home: House Home Access: Stairs to enter Secretary/administrator of Steps: 3 Entrance Stairs-Rails: Right Home Layout: One level     Bathroom Shower/Tub: Producer, television/film/video: Standard     Home Equipment: Environmental consultant - 2 wheels;Other (comment);Shower seat;Electric scooter(02--2L)          Prior Functioning/Environment Level of Independence: Independent with assistive device(s)        Comments: does not walk with device in home, uses scooter outside, sits to shower        OT Problem List: Decreased cognition      OT Treatment/Interventions:      OT Goals(Current goals can be found in the care plan section)    OT Frequency:  Barriers to D/C:            Co-evaluation              AM-PAC PT "6 Clicks" Daily Activity     Outcome Measure Help from another person eating meals?: None Help from another person taking care of personal grooming?: None Help from another person toileting, which includes using toliet, bedpan, or urinal?: A Little Help from another person bathing (including washing, rinsing, drying)?: A Little Help from another person to put on and taking off regular upper body clothing?: None Help from another person to put on and taking off regular lower body clothing?: None 6 Click Score: 22   End of Session Equipment Utilized During Treatment: Oxygen(2L) Nurse Communication: Mobility status  Activity Tolerance: Patient tolerated treatment well Patient left: in chair;with call bell/phone within reach;with chair alarm set  OT Visit Diagnosis: Other symptoms and signs involving cognitive function                Time:  1356-1416 OT Time Calculation (min): 20 min Charges:  OT General Charges $OT Visit: 1 Visit OT Evaluation $OT Eval Moderate Complexity: 1 Mod G-Codes:     04-Jul-2017 [name], OTR/L Pager: [phone]  Iran Planas, [place] Bailiff 2017/07/04, 2:27 PM

## 2017-06-11 LAB — MAGNESIUM: Magnesium: 2 mg/dL (ref 1.7–2.4)

## 2017-06-11 LAB — BASIC METABOLIC PANEL
Anion gap: 5 (ref 5–15)
BUN: 38 mg/dL — AB (ref 6–20)
CALCIUM: 9 mg/dL (ref 8.9–10.3)
CO2: 39 mmol/L — AB (ref 22–32)
CREATININE: 1.5 mg/dL — AB (ref 0.61–1.24)
Chloride: 93 mmol/L — ABNORMAL LOW (ref 101–111)
GFR calc Af Amer: 53 mL/min — ABNORMAL LOW (ref >60)
GFR calc non Af Amer: 46 mL/min — ABNORMAL LOW (ref >60)
GLUCOSE: 134 mg/dL — AB (ref 65–99)
Potassium: 4.5 mmol/L (ref 3.5–5.1)
Sodium: 137 mmol/L (ref 135–145)

## 2017-06-11 LAB — GLUCOSE, CAPILLARY
GLUCOSE-CAPILLARY: 227 mg/dL — AB (ref 65–99)
GLUCOSE-CAPILLARY: 263 mg/dL — AB (ref 65–99)
Glucose-Capillary: 138 mg/dL — ABNORMAL HIGH (ref 65–99)
Glucose-Capillary: 235 mg/dL — ABNORMAL HIGH (ref 65–99)

## 2017-06-11 MED ORDER — CARVEDILOL 6.25 MG PO TABS
9.3750 mg | ORAL_TABLET | Freq: Two times a day (BID) | ORAL | Status: DC
  Administered 2017-06-11 – 2017-06-12 (×2): 9.375 mg via ORAL
  Filled 2017-06-11 (×2): qty 1

## 2017-06-11 MED ORDER — SPIRONOLACTONE 12.5 MG HALF TABLET
12.5000 mg | ORAL_TABLET | Freq: Every day | ORAL | Status: DC
  Administered 2017-06-11 – 2017-06-13 (×3): 12.5 mg via ORAL
  Filled 2017-06-11 (×3): qty 1

## 2017-06-11 NOTE — Progress Notes (Signed)
[facility]                                                                              Patient Demographics  [name], is a 72 y.o. male, DOB - 09-12-1945, [name]:[id number]  Admit date - 06/06/2017   Admitting Physician Edsel Petrin, DO  Outpatient Primary MD for the patient is Georgann Housekeeper, MD  Outpatient specialists:   LOS - 5  days   Medical records reviewed and are as summarized below:    Chief Complaint  Patient presents with  . Fatigue  . Weakness       Brief summary   72 y.o.malewith a medical history ofCHF, atrial fibrillation, coronary disease, who presents to the emergency department with complaints of increasing shortness of breath, fatigue, weight gain. Admitted for CHF exacerbation. Cardiology consulted.   Assessment & Plan    Acute combined systolic and diastolic CHF exacerbation, CAD - presented with shortness of breath, fatigue, weight gain, BNP 486.4 on admission. -Chest x-ray showed pulmonary edema -Was recently placed on torsemide 2 weeks ago by cardiology. -Patient was placed on IV Lasix, increase to 80 mg twice a day, spironolactone restarted but held on 1/1 due to AK I.  Entresto also held. - Cardiology following underwent cardiac cath showed severe distal left main stenosis, chronic occlusion of RCA, patency of LAD and left circumflex but with ostial involvement of both vessels.  Per Dr. Excell Seltzer, high risk for atherectomy and stenting of the left main into the LAD, not a candidate for CABG secondary to 2 prior sternotomies, severe COPD and mild dementia.  Recommended ongoing palliative medical therapy for patient's CAD.  -On IV Lasix, received metolazone on 1/3, creatinine trending up to 1.5 today  -Negative balance of 2.1 L, weight down from 185lbs-> 169 today -Continue statin, no aspirin given Eliquis use    Atrial fibrillation, chronic (HCC) -Rate controlled, had AV nodal ablation, continue Eliquis     Hyperlipidemia -Continue Lipitor    Hypertension -Currently stable    COPD GOLD 0 still smoking  -Patient counseled on smoking cessation -Continue O2  Mild dementia -Currently at baseline  Acute kidney injury on chronic kidney disease stage III -Creatinine trended up to 2.0 -Resolved, spironolactone, Entresto held  Code Status: full  DVT Prophylaxis:  apixaban Family Communication: Discussed in detail with the patient, all imaging results, lab results explained to the patient  Disposition Plan:   Time Spent in minutes   25 minutes  Procedures:  Cardiac cath   Consultants:   Cardiology   Antimicrobials:      Medications  Scheduled Meds: . apixaban  5 mg Oral BID  . atorvastatin  40 mg Oral Daily  . carvedilol  6.25 mg Oral Q breakfast  . carvedilol  9.375 mg Oral Q supper  . famotidine  20 mg Oral BID  . fenofibrate  54 mg Oral Daily  . furosemide  80 mg Intravenous BID  . insulin aspart  0-5 Units Subcutaneous QHS  . insulin aspart  0-9 Units Subcutaneous TID WC  . loratadine  10 mg Oral Daily  . mouth rinse  15 mL Mouth Rinse  BID  . multivitamin with minerals  1 tablet Oral Daily  . nicotine  21 mg Transdermal Daily  . sodium chloride flush  3 mL Intravenous Q12H  . sodium chloride flush  3 mL Intravenous Q12H  . thiamine  100 mg Oral Daily   Continuous Infusions: . sodium chloride    . sodium chloride     PRN Meds:.sodium chloride, sodium chloride, acetaminophen, guaiFENesin-dextromethorphan, naLOXone (NARCAN)  injection, ondansetron (ZOFRAN) IV, sodium chloride flush, sodium chloride flush   Antibiotics   Anti-infectives (From admission, onward)   None        Subjective:   Compton Spilman was seen and examined today.  Feels slightly better, no complaints, shortness of breath improving.  No acute events overnight.  Patient denies dizziness, chest pain, abdominal pain, N/V/D/C, new weakness, numbess, tingling.   Objective:   Vitals:    06/10/17 0635 06/10/17 1417 06/10/17 2131 06/11/17 0500  BP: 116/64 123/63 (!) 108/93 (!) 131/59  Pulse: (!) 59 76 64 60  Resp:  (!) 21 (!) 27 (!) 26  Temp:  97.6 F (36.4 C) 97.8 F (36.6 C) 97.9 F (36.6 C)  TempSrc:  Oral Oral Oral  SpO2: 95% 97% 93% 95%  Weight:    77.1 kg (169 lb 15.6 oz)  Height:        Intake/Output Summary (Last 24 hours) at 06/11/2017 1343 Last data filed at 06/11/2017 1125 Gross per 24 hour  Intake 600 ml  Output 1700 ml  Net -1100 ml     Wt Readings from Last 3 Encounters:  06/11/17 77.1 kg (169 lb 15.6 oz)  03/30/17 79.4 kg (175 lb)  03/11/17 79.4 kg (175 lb)     Exam   General: Alert and oriented x 3, NAD  Eyes:  HEENT:  JVP +  Cardiovascular: S1 S2 clear, RRR. No pedal edema b/l  Respiratory: Clear to auscultation bilaterally, no wheezing, rales or rhonchi  Gastrointestinal: Soft, nontender, nondistended, + bowel sounds  Ext: no pedal edema bilaterally  Neuro: no new deficits  Musculoskeletal: No digital cyanosis, clubbing  Skin: No rashes  Psych: Normal affect and demeanor, alert and oriented x3    Data Reviewed:  I have personally reviewed following labs and imaging studies  Micro Results No results found for this or any previous visit (from the past 240 hour(s)).  Radiology Reports Dg Chest 2 View  Result Date: 06/06/2017 CLINICAL DATA:  Daughter stated, He is been sleeping for about 23 hours a day will go to the BR and then go back to bed. Eating but not as much as usual. Has Hx of copd,cad.ckd EXAM: CHEST  2 VIEW COMPARISON:  12/08/2016 FINDINGS: Prior median sternotomy. Pacer/AICD device. Mitral valve replacement. Midline trachea. Moderate cardiomegaly. Atherosclerosis in the transverse aorta. No right and no definite left pleural fluid. Remote right rib trauma. Moderate pulmonary interstitial prominence and indistinctness, progressive. Left pleural calcifications. Mild right hemidiaphragm elevation. IMPRESSION:  Cardiomegaly with progressive pulmonary interstitial prominence. Favored to represent mild pulmonary venous congestion, superimposed upon the sequelae of smoking/ chronic bronchitis. Aortic Atherosclerosis (ICD10-I70.0). Electronically Signed   By: Jeronimo Greaves M.D.   On: 06/06/2017 13:53    Lab Data:  CBC: Recent Labs  Lab 06/06/17 1037 06/08/17 0331 06/09/17 0255 06/10/17 0404  WBC 11.5* 10.1 11.3* 11.2*  HGB 12.9* 11.8* 11.8* 11.1*  HCT 41.6 39.0 39.2 38.9*  MCV 93.5 96.1 95.4 95.8  PLT 229 199 214 210   Basic Metabolic Panel: Recent Labs  Lab  06/07/17 0415 06/08/17 0331 06/09/17 0255 06/10/17 0404 06/11/17 0327  NA 141 138 139 141 137  K 3.6 4.6 5.1 5.0 4.5  CL 98* 98* 100* 101 93*  CO2 33* 34* 31 36* 39*  GLUCOSE 154* 162* 147* 137* 134*  BUN 23* 41* 43* 32* 38*  CREATININE 1.18 2.03* 1.54* 1.28* 1.50*  CALCIUM 9.4 8.9 9.3 9.3 9.0  MG  --   --   --   --  2.0   GFR: Estimated Creatinine Clearance: 43.5 mL/min (A) (by C-G formula based on SCr of 1.5 mg/dL (H)). Liver Function Tests: No results for input(s): AST, ALT, ALKPHOS, BILITOT, PROT, ALBUMIN in the last 168 hours. No results for input(s): LIPASE, AMYLASE in the last 168 hours. Recent Labs  Lab 06/09/17 1043  AMMONIA 48*   Coagulation Profile: No results for input(s): INR, PROTIME in the last 168 hours. Cardiac Enzymes: Recent Labs  Lab 06/06/17 1613 06/07/17 0742 06/07/17 1312 06/07/17 1914  TROPONINI 0.06* 0.06* 0.05* 0.05*   BNP (last 3 results) Recent Labs    05/25/17 1152  PROBNP 1,108*   HbA1C: No results for input(s): HGBA1C in the last 72 hours. CBG: Recent Labs  Lab 06/10/17 1205 06/10/17 1702 06/10/17 2136 06/11/17 0720 06/11/17 1124  GLUCAP 199* 156* 179* 138* 235*   Lipid Profile: No results for input(s): CHOL, HDL, LDLCALC, TRIG, CHOLHDL, LDLDIRECT in the last 72 hours. Thyroid Function Tests: No results for input(s): TSH, T4TOTAL, FREET4, T3FREE, THYROIDAB in the  last 72 hours. Anemia Panel: No results for input(s): VITAMINB12, FOLATE, FERRITIN, TIBC, IRON, RETICCTPCT in the last 72 hours. Urine analysis:    Component Value Date/Time   COLORURINE YELLOW 06/06/2017 1542   APPEARANCEUR CLEAR 06/06/2017 1542   LABSPEC 1.015 06/06/2017 1542   PHURINE 7.0 06/06/2017 1542   GLUCOSEU >=500 (A) 06/06/2017 1542   HGBUR NEGATIVE 06/06/2017 1542   BILIRUBINUR NEGATIVE 06/06/2017 1542   KETONESUR NEGATIVE 06/06/2017 1542   PROTEINUR NEGATIVE 06/06/2017 1542   UROBILINOGEN 0.2 12/24/2014 0311   NITRITE NEGATIVE 06/06/2017 1542   LEUKOCYTESUR NEGATIVE 06/06/2017 1542     Hema Lanza M.D. [facility] 06/11/2017, 1:43 PM  Pager: [phone] Between 7am to 7pm - call Pager - [phone]  After 7pm go to [email or web address] - password [email or web address]  Call night coverage person covering after 7pm

## 2017-06-11 NOTE — Care Management Important Message (Signed)
Important Message  Patient Details  Name: DEIVY ROUP MRN: [id number] Date of Birth: 16-May-1946   Medicare Important Message Given:  Yes    Kyla Balzarine 06/11/2017, 11:49 AM

## 2017-06-11 NOTE — Progress Notes (Signed)
Patient ID: [name], male   DOB: 1946-02-18, 72 y.o.   MRN: [id number]     Advanced Heart Failure Rounding Note  Primary Cardiologist: Shirlee Latch  Subjective:    Feeling ok this am. Son in law in room and daughter on phone.  Pt is willing to go to SNF for short term rehab.  Denies SOB or CP this am.   Negative 700 cc and down 2 lbs.   VAS US Aorta/IVC/Iliacs 06/09/17 Abdominal Aorta: Unable to visualize due to overlying bowel gas. IVC/Iliac: Elevated velocities suggest possible 30-49% right common iliac artery stenosis and 50-74% bilateral external iliac artery stenosis.  LHC/RHC 06/09/17 Left Main  90% stenosis distal left main just prior to bifurcation to LAD and LCx. Tortuous proximal LCx.  Left Anterior Descending  40% mid LAD stenosis.  Left Circumflex  50% stenosis proximal PLOM.  Right Coronary Artery  Totally occluded proximal RCA with left to right collaterals. This was similar to prior study.  RHC Procedural Findings: Hemodynamics (mmHg) RA mean 12 RV 64/14 PA 65/23, mean 39 PCWP mean 23 LV 92/22 AO 101/57  Oxygen saturations: PA 64% AO 96%  Cardiac Output (Fick) 4.9  Cardiac Index (Fick) 2.6 PVR 3.2 WU   Objective:   Weight Range: 169 lb 15.6 oz (77.1 kg) Body mass index is 26.62 kg/m.   Vital Signs:   Temp:  [97.6 F (36.4 C)-97.9 F (36.6 C)] 97.9 F (36.6 C) (01/04 0500) Pulse Rate:  [60-76] 60 (01/04 0500) Resp:  [21-27] 26 (01/04 0500) BP: (108-131)/(59-93) 131/59 (01/04 0500) SpO2:  [93 %-97 %] 95 % (01/04 0500) Weight:  [169 lb 15.6 oz (77.1 kg)] 169 lb 15.6 oz (77.1 kg) (01/04 0500) Last BM Date: 06/09/17  Weight change: Filed Weights   06/09/17 0535 06/10/17 0500 06/11/17 0500  Weight: 171 lb 11.2 oz (77.9 kg) 171 lb 11.8 oz (77.9 kg) 169 lb 15.6 oz (77.1 kg)    Intake/Output:   Intake/Output Summary (Last 24 hours) at 06/11/2017 0930 Last data filed at 06/11/2017 0049 Gross per 24 hour  Intake 960 ml  Output 1725 ml  Net -765  ml      Physical Exam    General: NAD HEENT: Normal Neck: Supple. JVP at least 8-9 cm. Carotids 2+ bilat; no bruits. No thyromegaly or nodule noted. Cor: PMI nondisplaced. RRR, No M/G/R noted Lungs: CTAB, normal effort. Abdomen: Soft, non-tender, non-distended, no HSM. No bruits or masses. +BS  Extremities: No cyanosis, clubbing, or rash. R and LLE no edema.  Neuro: Alert & orientedx3, cranial nerves grossly intact. moves all 4 extremities w/o difficulty. Affect pleasant   Telemetry   Afib with BiV pacing, personally reviewed.   Labs    CBC Recent Labs    06/09/17 0255 06/10/17 0404  WBC 11.3* 11.2*  HGB 11.8* 11.1*  HCT 39.2 38.9*  MCV 95.4 95.8  PLT 214 210   Basic Metabolic Panel Recent Labs    93/81/01 0404 06/11/17 0327  NA 141 137  K 5.0 4.5  CL 101 93*  CO2 36* 39*  GLUCOSE 137* 134*  BUN 32* 38*  CREATININE 1.28* 1.50*  CALCIUM 9.3 9.0  MG  --  2.0   Liver Function Tests No results for input(s): AST, ALT, ALKPHOS, BILITOT, PROT, ALBUMIN in the last 72 hours. No results for input(s): LIPASE, AMYLASE in the last 72 hours. Cardiac Enzymes No results for input(s): CKTOTAL, CKMB, CKMBINDEX, TROPONINI in the last 72 hours.  BNP: BNP (last 3 results)  Recent Labs    01/27/17 1046 02/01/17 1054 06/06/17 1037  BNP 136.7* 102.6* 486.4*    ProBNP (last 3 results) Recent Labs    05/25/17 1152  PROBNP 1,108*     D-Dimer No results for input(s): DDIMER in the last 72 hours. Hemoglobin A1C No results for input(s): HGBA1C in the last 72 hours. Fasting Lipid Panel No results for input(s): CHOL, HDL, LDLCALC, TRIG, CHOLHDL, LDLDIRECT in the last 72 hours. Thyroid Function Tests No results for input(s): TSH, T4TOTAL, T3FREE, THYROIDAB in the last 72 hours.  Invalid input(s): FREET3  Other results:   Imaging   No results found.  Medications:     Scheduled Medications: . apixaban  5 mg Oral BID  . atorvastatin  40 mg Oral Daily  .  carvedilol  6.25 mg Oral Q breakfast  . carvedilol  9.375 mg Oral Q supper  . famotidine  20 mg Oral BID  . fenofibrate  54 mg Oral Daily  . furosemide  80 mg Intravenous BID  . insulin aspart  0-5 Units Subcutaneous QHS  . insulin aspart  0-9 Units Subcutaneous TID WC  . loratadine  10 mg Oral Daily  . mouth rinse  15 mL Mouth Rinse BID  . multivitamin with minerals  1 tablet Oral Daily  . nicotine  21 mg Transdermal Daily  . sodium chloride flush  3 mL Intravenous Q12H  . sodium chloride flush  3 mL Intravenous Q12H  . thiamine  100 mg Oral Daily    Infusions: . sodium chloride    . sodium chloride      PRN Medications: sodium chloride, sodium chloride, acetaminophen, guaiFENesin-dextromethorphan, naLOXone (NARCAN)  injection, ondansetron (ZOFRAN) IV, sodium chloride flush, sodium chloride flush    Patient Profile   [name] is a 72 y.o. 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    He was admitted on 12/30 with acute on chronic systolic and diastolic heart failure exacerbation.   Assessment/Plan   1. 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  MDT CRT-D. Still an active drinker. He was admitted with increased dyspnea, poor exercise tolerance. He has looked volume overloaded on exam and IV Lasix was started.  Initially, he did not diurese well and creatinine increased from 1.1 to 2 and diuretics held.  This was somewhat surprising.   - Creatinine back up slightly with diuresis yesterday.  - Volume status remains elevated on exam. - Continue IV lasix 80 mg BID today, no metolazone this am, but will discuss with MD with mild creatinine rise.  - Can continue Coreg for now. - Needs ETOH cessation.  2. CAD: No s/s of  ischemia.  Mild troponin increase with no trend, likely demand ischemia related to volume overload. Last cath in 2008 with medical management but with moderate left main stenosis.   - Creatinine back up slightly with diuresis yesterday.  - LHC 06/09/17 with 90% Left main. Dr Excell Seltzer has seen and we agree that it is too high risk with RCA blockage and PAD preventing Impella use.  - Continue statin, no ASA given Eliquis use.  3. Atrial fibrillation: Chronic.  Had AV nodal ablation, now BiV pacing.  - continue Eliquis.  4. COPD: Still active smoker, 2 ppd.  On home oxygen.  -  Continue oxygen per [place] - Needs to quit smoking. No change. 5. ETOH: Ongoing abuse. - Needs to stop completely.  6. MV replacement: Bioprosthetic. - Looked ok on last echo.  7. AKI:  - Improved. Creatinine up from 1.28 to 1.50 with diuresis yesterday.  - Continue to follow closely.  8. Dementia:  - Suspect early dementia. No change.   Length of Stay: 5  Graciella Freer, [place]  06/11/2017, 9:30 AM  Advanced Heart Failure Team Pager [phone] (M-F; 7a - 4p)  Please contact [facility] for night-coverage after hours (4p -7a ) and weekends on [email or web address]  Patient seen with PA, agree with the above note.  On exam, he remains volume overloaded with JVP in the 10 range.  Creatinine up to 1.5.    1. 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 Echo 10/18 EF 25-30%.  MDT CRT-D. Still an active drinker. He was admitted with increased dyspnea, poor exercise tolerance.He has looked volume overloaded on exam and IV Lasix was started. RHC showed preserved cardiac output but elevated filling pressures (though not markedly elevated).  Creatinine up a bit to 1.5 today.  Still some volume overload on exam. Weight is coming down.  -Would give one more day of Lasix 80 mg IV bid, probably to po torsemide tomorrow, 60 mg daily.   - Continue Coreg 9.375 mg bid.  - OK to add spironolactone  12.5 daily today but follow K closely.  - Off Entresto with recent AKI (think he may not have actually been taking it regularly at home). When creatinine stabilizes, will restart losartan at low dose and titrate up.  - Needs complete ETOH cessation.  2. CAD: No chest pain. Mild troponin increase with no trend, likely demand ischemia related to volume overload. Coronary angiogram showed chronically occluded RCA with 90% distal left main stenosis.  Appreciate Dr. Earmon Phoenix consult.  Patient is not a candidate for CABG with 2 prior sternotomies, severe COPD, and mild dementia.  Intervention on left main is technically feasible but with low EF and chronically occluded RCA, would need Impella support.  He has significant bilateral PAD on dopplers this admission, probably would not be able to place a femoral Impella.  Risk of non-protected intervention would be significant, and he is not particularly mobile at home with early dementia.  Review of prior cath in 2016 showed that left main stenosis is pretty similar to 3 years ago.  Therefore, after discussion we have elected for continued medical management.  - Continue statin, no ASA given Eliquis use.  3. Atrial fibrillation: Chronic. Had AV nodal ablation, now BiV pacing. - Continue Eliquis 5 mg bid.  4. COPD: Still active smoker, 2 ppd. On home oxygen.  - Continue oxygen per [place] - Needs to quit smoking.  5. ETOH: Ongoing abuse. Needs to stop => daughter says that he has cut back a lot.  6. MV replacement: Bioprosthetic. Looked ok on last echo. 7. AKI: Follow creatinine closely with diuresis.   8. Dementia: Suspect early dementia.  Overall, his prognosis is limited by poor understanding of his situation and poor compliance with medications. His daughter and son-in-law are trying as much as possible to keep him on his medications at home.  I talked to his son-in-law by phone today and updated him on the plan (one more day of diuresis, medical  management).   Marca Ancona 06/11/2017 2:14 PM

## 2017-06-11 NOTE — Plan of Care (Deleted)
Progressing

## 2017-06-12 DIAGNOSIS — I2510 Atherosclerotic heart disease of native coronary artery without angina pectoris: Secondary | ICD-10-CM

## 2017-06-12 LAB — BASIC METABOLIC PANEL
ANION GAP: 10 (ref 5–15)
BUN: 40 mg/dL — ABNORMAL HIGH (ref 6–20)
CALCIUM: 9.8 mg/dL (ref 8.9–10.3)
CO2: 40 mmol/L — ABNORMAL HIGH (ref 22–32)
CREATININE: 1.53 mg/dL — AB (ref 0.61–1.24)
Chloride: 88 mmol/L — ABNORMAL LOW (ref 101–111)
GFR, EST AFRICAN AMERICAN: 52 mL/min — AB (ref >60)
GFR, EST NON AFRICAN AMERICAN: 45 mL/min — AB (ref >60)
GLUCOSE: 124 mg/dL — AB (ref 65–99)
Potassium: 4.5 mmol/L (ref 3.5–5.1)
Sodium: 138 mmol/L (ref 135–145)

## 2017-06-12 LAB — GLUCOSE, CAPILLARY
GLUCOSE-CAPILLARY: 214 mg/dL — AB (ref 65–99)
Glucose-Capillary: 129 mg/dL — ABNORMAL HIGH (ref 65–99)
Glucose-Capillary: 187 mg/dL — ABNORMAL HIGH (ref 65–99)
Glucose-Capillary: 148 mg/dL — ABNORMAL HIGH (ref 65–99)

## 2017-06-12 MED ORDER — LOSARTAN POTASSIUM 25 MG PO TABS
12.5000 mg | ORAL_TABLET | Freq: Two times a day (BID) | ORAL | Status: DC
  Administered 2017-06-12 – 2017-06-13 (×2): 12.5 mg via ORAL
  Filled 2017-06-12 (×2): qty 1

## 2017-06-12 MED ORDER — CARVEDILOL 6.25 MG PO TABS
6.2500 mg | ORAL_TABLET | Freq: Two times a day (BID) | ORAL | Status: DC
  Administered 2017-06-12 – 2017-06-13 (×2): 6.25 mg via ORAL
  Filled 2017-06-12 (×2): qty 1

## 2017-06-12 MED ORDER — METOLAZONE 5 MG PO TABS
5.0000 mg | ORAL_TABLET | Freq: Once | ORAL | Status: AC
  Administered 2017-06-12: 5 mg via ORAL
  Filled 2017-06-12: qty 1

## 2017-06-12 NOTE — Progress Notes (Signed)
[facility]                                                                              Patient Demographics  [name], is a 72 y.o. male, DOB - [birth year], WUX:[id number]  Admit date - 06/06/2017   Admitting Physician Edsel Petrin, DO  Outpatient Primary MD for the patient is Georgann Housekeeper, MD  Outpatient specialists:   LOS - 6  days   Medical records reviewed and are as summarized below:    Chief Complaint  Patient presents with  . Fatigue  . Weakness       Brief summary   72 y.o.malewith a medical history ofCHF, atrial fibrillation, coronary disease, who presents to the emergency department with complaints of increasing shortness of breath, fatigue, weight gain. Admitted for CHF exacerbation. Cardiology consulted.   Assessment & Plan    Acute combined systolic and diastolic CHF exacerbation, CAD - presented with shortness of breath, fatigue, weight gain, BNP 486.4 on admission. -Chest x-ray showed pulmonary edema -Was recently placed on torsemide 2 weeks ago by cardiology. -Patient was placed on IV Lasix, increase to 80 mg twice a day, spironolactone restarted but held on 1/1 due to AK I.  Entresto also held. - Cardiology following underwent cardiac cath showed severe distal left main stenosis, chronic occlusion of RCA, patency of LAD and left circumflex but with ostial involvement of both vessels.  Per Dr. Excell Seltzer, high risk for atherectomy and stenting of the left main into the LAD, not a candidate for CABG secondary to 2 prior sternotomies, severe COPD and mild dementia.  Recommended ongoing palliative medical therapy for patient's CAD.  -Continue statin, no aspirin given Eliquis use -  continue IV Lasix, negative balance of 3.1 L, recommended metolazone x1 today by cardiology -Follow creatinine closely    Atrial fibrillation, chronic (HCC) -Rate controlled, had AV nodal ablation, continue Eliquis    Hyperlipidemia -Continue  Lipitor    Hypertension -Currently stable    COPD GOLD 0 still smoking  -Patient counseled on smoking cessation -Continue O2  Mild dementia -Currently at baseline  Acute kidney injury on chronic kidney disease stage III -Creatinine trended up to 2.0 -Resolved, spironolactone, Entresto held, creatinine slightly trended up but overall stable no complaints,  Code Status: full  DVT Prophylaxis:  apixaban Family Communication: Discussed in detail with the patient, all imaging results, lab results explained to the patient  Disposition Plan:   Time Spent in minutes   25 minutes  Procedures:  Cardiac cath   Consultants:   Cardiology   Antimicrobials:      Medications  Scheduled Meds: . apixaban  5 mg Oral BID  . atorvastatin  40 mg Oral Daily  . carvedilol  6.25 mg Oral BID WC  . famotidine  20 mg Oral BID  . fenofibrate  54 mg Oral Daily  . furosemide  80 mg Intravenous BID  . insulin aspart  0-5 Units Subcutaneous QHS  . insulin aspart  0-9 Units Subcutaneous TID WC  . loratadine  10 mg Oral Daily  . losartan  12.5 mg Oral BID  . mouth rinse  15 mL  Mouth Rinse BID  . multivitamin with minerals  1 tablet Oral Daily  . nicotine  21 mg Transdermal Daily  . sodium chloride flush  3 mL Intravenous Q12H  . sodium chloride flush  3 mL Intravenous Q12H  . spironolactone  12.5 mg Oral Daily  . thiamine  100 mg Oral Daily   Continuous Infusions: . sodium chloride    . sodium chloride     PRN Meds:.sodium chloride, sodium chloride, acetaminophen, guaiFENesin-dextromethorphan, naLOXone (NARCAN)  injection, ondansetron (ZOFRAN) IV, sodium chloride flush, sodium chloride flush   Antibiotics   Anti-infectives (From admission, onward)   None        Subjective:   [name] was seen and examined today.  Shortness of breath is improving. No acute events overnight.  Patient denies dizziness, chest pain, abdominal pain, N/V/D/C, new weakness, numbess, tingling.    Objective:   Vitals:   06/11/17 1447 06/11/17 2112 06/12/17 0559 06/12/17 1420  BP: (!) 125/54 101/60 (!) 120/47 122/65  Pulse:  62 61   Resp:  19    Temp: 98.1 F (36.7 C) 98.3 F (36.8 C) 97.7 F (36.5 C) 97.8 F (36.6 C)  TempSrc: Oral Oral Oral Oral  SpO2: 94% 96% 91% 96%  Weight:   77.2 kg (170 lb 3.1 oz)   Height:        Intake/Output Summary (Last 24 hours) at 06/12/2017 1657 Last data filed at 06/12/2017 1123 Gross per 24 hour  Intake -  Output 825 ml  Net -825 ml     Wt Readings from Last 3 Encounters:  06/12/17 77.2 kg (170 lb 3.1 oz)  03/30/17 79.4 kg (175 lb)  03/11/17 79.4 kg (175 lb)     Exam   General: Alert and oriented x 3, NAD  Eyes:   HEENT:  JVP+  Cardiovascular: S1 S2 clear, RRR no pedal edema b/l  Respiratory: Decreased breath sound at the bases  Gastrointestinal: Soft, nontender, nondistended, + bowel sounds  Ext: no pedal edema bilaterally  Neuro: no new deficits  Musculoskeletal: No digital cyanosis, clubbing  Skin: No rashes  Psych: Normal affect and demeanor, alert and oriented x3     Data Reviewed:  I have personally reviewed following labs and imaging studies  Micro Results No results found for this or any previous visit (from the past 240 hour(s)).  Radiology Reports Dg Chest 2 View  Result Date: 06/06/2017 CLINICAL DATA:  Daughter stated, He is been sleeping for about 23 hours a day will go to the BR and then go back to bed. Eating but not as much as usual. Has Hx of copd,cad.ckd EXAM: CHEST  2 VIEW COMPARISON:  12/08/2016 FINDINGS: Prior median sternotomy. Pacer/AICD device. Mitral valve replacement. Midline trachea. Moderate cardiomegaly. Atherosclerosis in the transverse aorta. No right and no definite left pleural fluid. Remote right rib trauma. Moderate pulmonary interstitial prominence and indistinctness, progressive. Left pleural calcifications. Mild right hemidiaphragm elevation. IMPRESSION: Cardiomegaly with  progressive pulmonary interstitial prominence. Favored to represent mild pulmonary venous congestion, superimposed upon the sequelae of smoking/ chronic bronchitis. Aortic Atherosclerosis (ICD10-I70.0). Electronically Signed   By: Jeronimo Greaves M.D.   On: 06/06/2017 13:53    Lab Data:  CBC: Recent Labs  Lab 06/06/17 1037 06/08/17 0331 06/09/17 0255 06/10/17 0404  WBC 11.5* 10.1 11.3* 11.2*  HGB 12.9* 11.8* 11.8* 11.1*  HCT 41.6 39.0 39.2 38.9*  MCV 93.5 96.1 95.4 95.8  PLT 229 199 214 210   Basic Metabolic Panel: Recent Labs  Lab 06/08/17 0331 06/09/17 0255 06/10/17 0404 06/11/17 0327 06/12/17 0340  NA 138 139 141 137 138  K 4.6 5.1 5.0 4.5 4.5  CL 98* 100* 101 93* 88*  CO2 34* 31 36* 39* 40*  GLUCOSE 162* 147* 137* 134* 124*  BUN 41* 43* 32* 38* 40*  CREATININE 2.03* 1.54* 1.28* 1.50* 1.53*  CALCIUM 8.9 9.3 9.3 9.0 9.8  MG  --   --   --  2.0  --    GFR: Estimated Creatinine Clearance: 42.6 mL/min (A) (by C-G formula based on SCr of 1.53 mg/dL (H)). Liver Function Tests: No results for input(s): AST, ALT, ALKPHOS, BILITOT, PROT, ALBUMIN in the last 168 hours. No results for input(s): LIPASE, AMYLASE in the last 168 hours. Recent Labs  Lab 06/09/17 1043  AMMONIA 48*   Coagulation Profile: No results for input(s): INR, PROTIME in the last 168 hours. Cardiac Enzymes: Recent Labs  Lab 06/06/17 1613 06/07/17 0742 06/07/17 1312 06/07/17 1914  TROPONINI 0.06* 0.06* 0.05* 0.05*   BNP (last 3 results) Recent Labs    05/25/17 1152  PROBNP 1,108*   HbA1C: No results for input(s): HGBA1C in the last 72 hours. CBG: Recent Labs  Lab 06/11/17 1619 06/11/17 2144 06/12/17 0744 06/12/17 1121 06/12/17 1626  GLUCAP 227* 263* 129* 187* 214*   Lipid Profile: No results for input(s): CHOL, HDL, LDLCALC, TRIG, CHOLHDL, LDLDIRECT in the last 72 hours. Thyroid Function Tests: No results for input(s): TSH, T4TOTAL, FREET4, T3FREE, THYROIDAB in the last 72  hours. Anemia Panel: No results for input(s): VITAMINB12, FOLATE, FERRITIN, TIBC, IRON, RETICCTPCT in the last 72 hours. Urine analysis:    Component Value Date/Time   COLORURINE YELLOW 06/06/2017 1542   APPEARANCEUR CLEAR 06/06/2017 1542   LABSPEC 1.015 06/06/2017 1542   PHURINE 7.0 06/06/2017 1542   GLUCOSEU >=500 (A) 06/06/2017 1542   HGBUR NEGATIVE 06/06/2017 1542   BILIRUBINUR NEGATIVE 06/06/2017 1542   KETONESUR NEGATIVE 06/06/2017 1542   PROTEINUR NEGATIVE 06/06/2017 1542   UROBILINOGEN 0.2 12/24/2014 0311   NITRITE NEGATIVE 06/06/2017 1542   LEUKOCYTESUR NEGATIVE 06/06/2017 1542     Ivry Pigue M.D. [facility] 06/12/2017, 4:57 PM  Pager: [phone] Between 7am to 7pm - call Pager - [phone]  After 7pm go to [email or web address] - password [email or web address]  Call night coverage person covering after 7pm

## 2017-06-12 NOTE — Progress Notes (Signed)
Patient ID: [name], male   DOB: May 06, 1946, 72 y.o.   MRN: [id number]     Advanced Heart Failure Rounding Note  Primary Cardiologist: Shirlee Latch  Subjective:    Denies CP or SOB. Says he feels good.   Remains on IV lasix. Clear urine in Foley bag. Weight up 1 pound.   VAS US Aorta/IVC/Iliacs 06/09/17 Abdominal Aorta: Unable to visualize due to overlying bowel gas. IVC/Iliac: Elevated velocities suggest possible 30-49% right common iliac artery stenosis and 50-74% bilateral external iliac artery stenosis.  LHC/RHC 06/09/17 Left Main  90% stenosis distal left main just prior to bifurcation to LAD and LCx. Tortuous proximal LCx.  Left Anterior Descending  40% mid LAD stenosis.  Left Circumflex  50% stenosis proximal PLOM.  Right Coronary Artery  Totally occluded proximal RCA with left to right collaterals. This was similar to prior study.  RHC Procedural Findings: Hemodynamics (mmHg) RA mean 12 RV 64/14 PA 65/23, mean 39 PCWP mean 23 LV 92/22 AO 101/57  Oxygen saturations: PA 64% AO 96%  Cardiac Output (Fick) 4.9  Cardiac Index (Fick) 2.6 PVR 3.2 WU   Objective:   Weight Range: 77.2 kg (170 lb 3.1 oz) Body mass index is 26.66 kg/m.   Vital Signs:   Temp:  [97.7 F (36.5 C)-98.3 F (36.8 C)] 97.7 F (36.5 C) (01/05 0559) Pulse Rate:  [61-62] 61 (01/05 0559) Resp:  [19] 19 (01/04 2112) BP: (101-125)/(47-60) 120/47 (01/05 0559) SpO2:  [91 %-96 %] 91 % (01/05 0559) Weight:  [77.2 kg (170 lb 3.1 oz)] 77.2 kg (170 lb 3.1 oz) (01/05 0559) Last BM Date: 06/09/17  Weight change: Filed Weights   06/10/17 0500 06/11/17 0500 06/12/17 0559  Weight: 77.9 kg (171 lb 11.8 oz) 77.1 kg (169 lb 15.6 oz) 77.2 kg (170 lb 3.1 oz)    Intake/Output:   Intake/Output Summary (Last 24 hours) at 06/12/2017 1346 Last data filed at 06/12/2017 1123 Gross per 24 hour  Intake -  Output 1075 ml  Net -1075 ml      Physical Exam    General:  Well appearing. No resp  difficulty HEENT: normal Neck: supple. JVP to jaw  Carotids 2+ bilat; no bruits. No lymphadenopathy or thryomegaly appreciated. Cor: PMI laterally displaced. Regular rate & rhythm. No rubs, gallops or murmurs. Lungs: clear Abdomen: soft, nontender, nondistended. No hepatosplenomegaly. No bruits or masses. Good bowel sounds. Extremities: no cyanosis, clubbing, rash, 1+ edema Neuro: alert & orientedx3, cranial nerves grossly intact. moves all 4 extremities w/o difficulty. Affect pleasant  Telemetry   Afib with BiV pacing in 60s, personally reviewed.   Labs    CBC Recent Labs    06/10/17 0404  WBC 11.2*  HGB 11.1*  HCT 38.9*  MCV 95.8  PLT 210   Basic Metabolic Panel Recent Labs    40/98/11 0327 06/12/17 0340  NA 137 138  K 4.5 4.5  CL 93* 88*  CO2 39* 40*  GLUCOSE 134* 124*  BUN 38* 40*  CREATININE 1.50* 1.53*  CALCIUM 9.0 9.8  MG 2.0  --    Liver Function Tests No results for input(s): AST, ALT, ALKPHOS, BILITOT, PROT, ALBUMIN in the last 72 hours. No results for input(s): LIPASE, AMYLASE in the last 72 hours. Cardiac Enzymes No results for input(s): CKTOTAL, CKMB, CKMBINDEX, TROPONINI in the last 72 hours.  BNP: BNP (last 3 results) Recent Labs    01/27/17 1046 02/01/17 1054 06/06/17 1037  BNP 136.7* 102.6* 486.4*    ProBNP (last  3 results) Recent Labs    05/25/17 1152  PROBNP 1,108*     D-Dimer No results for input(s): DDIMER in the last 72 hours. Hemoglobin A1C No results for input(s): HGBA1C in the last 72 hours. Fasting Lipid Panel No results for input(s): CHOL, HDL, LDLCALC, TRIG, CHOLHDL, LDLDIRECT in the last 72 hours. Thyroid Function Tests No results for input(s): TSH, T4TOTAL, T3FREE, THYROIDAB in the last 72 hours.  Invalid input(s): FREET3  Other results:   Imaging   No results found.  Medications:     Scheduled Medications: . apixaban  5 mg Oral BID  . atorvastatin  40 mg Oral Daily  . carvedilol  9.375 mg Oral BID  WC  . famotidine  20 mg Oral BID  . fenofibrate  54 mg Oral Daily  . furosemide  80 mg Intravenous BID  . insulin aspart  0-5 Units Subcutaneous QHS  . insulin aspart  0-9 Units Subcutaneous TID WC  . loratadine  10 mg Oral Daily  . mouth rinse  15 mL Mouth Rinse BID  . multivitamin with minerals  1 tablet Oral Daily  . nicotine  21 mg Transdermal Daily  . sodium chloride flush  3 mL Intravenous Q12H  . sodium chloride flush  3 mL Intravenous Q12H  . spironolactone  12.5 mg Oral Daily  . thiamine  100 mg Oral Daily    Infusions: . sodium chloride    . sodium chloride      PRN Medications: sodium chloride, sodium chloride, acetaminophen, guaiFENesin-dextromethorphan, naLOXone (NARCAN)  injection, ondansetron (ZOFRAN) IV, sodium chloride flush, sodium chloride flush    Patient Profile   [name] is a 72 y.o. 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    He was admitted on 12/30 with acute on chronic systolic and diastolic heart failure exacerbation.   Assessment/Plan   1. 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  MDT CRT-D. Still an active drinker. He was admitted with increased dyspnea, poor exercise tolerance. He has looked volume overloaded on exam and IV Lasix was started.  Initially, he did not diurese well and creatinine increased from 1.1 to 2 and diuretics held.  This was somewhat surprising.   - Creatinine back up slightly with diuresis but stable overnight. CVP still up.  - Will continue IV lasix 80 mg BID today will give one dose metolazone - Cut carvedilol back to 6.25. - start low-dose losartan 12.5 bid - Needs ETOH cessation.  2. CAD: No s/s of ischemia.  Mild troponin increase with no trend, likely  demand ischemia related to volume overload. Last cath in 2008 with medical management but with moderate left main stenosis.   - LHC 06/09/17 with 90% Left main and CTO RCA. Dr Excell Seltzer has seen and felt that it is too high risk with RCA blockage and PAD preventing Impella use.  - Continue statin, no ASA given Eliquis use. Continue ASA 3. Atrial fibrillation: Chronic.  Had AV nodal ablation, now BiV pacing.  - continue Eliquis.  4. COPD: Still active smoker, 2 ppd.  On home oxygen.  - Continue oxygen per [place] - Needs to quit smoking. No change. 5. ETOH: Ongoing abuse. - Needs to stop completely.  6. MV replacement: Bioprosthetic. - Looked ok on last echo.  7. AKI:  - Improved. Creatinine stable at 1.5 today - Continue to follow closely.  8. Dementia:  - Suspect early dementia. No change.   Length of Stay: 6  Arvilla Meres, MD  06/12/2017, 1:46 PM  Advanced Heart Failure Team Pager [phone] (M-F; 7a - 4p)  Please contact [facility] for night-coverage after hours (4p -7a ) and weekends on [email or web address]

## 2017-06-13 DIAGNOSIS — N179 Acute kidney failure, unspecified: Secondary | ICD-10-CM

## 2017-06-13 LAB — BASIC METABOLIC PANEL WITH GFR
Anion gap: 10 (ref 5–15)
BUN: 58 mg/dL — ABNORMAL HIGH (ref 6–20)
CO2: 39 mmol/L — ABNORMAL HIGH (ref 22–32)
Calcium: 9.9 mg/dL (ref 8.9–10.3)
Chloride: 82 mmol/L — ABNORMAL LOW (ref 101–111)
Creatinine, Ser: 2.03 mg/dL — ABNORMAL HIGH (ref 0.61–1.24)
GFR calc Af Amer: 37 mL/min — ABNORMAL LOW
GFR calc non Af Amer: 32 mL/min — ABNORMAL LOW
Glucose, Bld: 143 mg/dL — ABNORMAL HIGH (ref 65–99)
Potassium: 4.6 mmol/L (ref 3.5–5.1)
Sodium: 131 mmol/L — ABNORMAL LOW (ref 135–145)

## 2017-06-13 LAB — GLUCOSE, CAPILLARY
Glucose-Capillary: 170 mg/dL — ABNORMAL HIGH (ref 65–99)
Glucose-Capillary: 243 mg/dL — ABNORMAL HIGH (ref 65–99)

## 2017-06-13 MED ORDER — LORATADINE 10 MG PO TABS: 10.0000 mg | ORAL_TABLET | Freq: Every day | ORAL | 1 refills | Status: DC

## 2017-06-13 MED ORDER — TORSEMIDE 20 MG PO TABS: 60.0000 mg | ORAL_TABLET | Freq: Every day | ORAL | 3 refills | Status: DC

## 2017-06-13 MED ORDER — LOSARTAN POTASSIUM 25 MG PO TABS: 25.0000 mg | ORAL_TABLET | Freq: Every day | ORAL | 3 refills | Status: DC

## 2017-06-13 MED ORDER — GUAIFENESIN-DM 100-10 MG/5ML PO SYRP: 5.0000 mL | ORAL_SOLUTION | ORAL | 0 refills | Status: DC | PRN

## 2017-06-13 MED ORDER — CARVEDILOL 6.25 MG PO TABS: 6.2500 mg | ORAL_TABLET | Freq: Two times a day (BID) | ORAL | 3 refills | Status: DC

## 2017-06-13 NOTE — Plan of Care (Signed)
  Clinical Measurements: Respiratory complications will improve. Pt denied sob and maintained O2 sat wnl this shift.  06/13/2017 0448 - Progressing by Mellody Drown, RN   Safety: Ability to remain free from injury will improve. No falls, new signs of skin breakdown or other injuries this shift.  06/13/2017 0448 - Progressing by Mellody Drown, RN

## 2017-06-13 NOTE — Progress Notes (Addendum)
Patient ID: [name], male   DOB: [birth year], 72 y.o.   MRN: [id number]     Advanced Heart Failure Rounding Note  Primary Cardiologist: Shirlee Latch  Subjective:     Remains on IV lasix. Received metolazone yesterday.  Denies CP or SOB. Weight down 1 pound. . Creatinine 1.5->2.0  Sitting in bed eating large gravy biscuit and drink his family brought him form [place]   VAS US Aorta/IVC/Iliacs 06/09/17 Abdominal Aorta: Unable to visualize due to overlying bowel gas. IVC/Iliac: Elevated velocities suggest possible 30-49% right common iliac artery stenosis and 50-74% bilateral external iliac artery stenosis.  LHC/RHC 06/09/17 Left Main  90% stenosis distal left main just prior to bifurcation to LAD and LCx. Tortuous proximal LCx.  Left Anterior Descending  40% mid LAD stenosis.  Left Circumflex  50% stenosis proximal PLOM.  Right Coronary Artery  Totally occluded proximal RCA with left to right collaterals. This was similar to prior study.  RHC Procedural Findings: Hemodynamics (mmHg) RA mean 12 RV 64/14 PA 65/23, mean 39 PCWP mean 23 LV 92/22 AO 101/57  Oxygen saturations: PA 64% AO 96%  Cardiac Output (Fick) 4.9  Cardiac Index (Fick) 2.6 PVR 3.2 WU   Objective:   Weight Range: 76.8 kg (169 lb 5 oz) Body mass index is 26.52 kg/m.   Vital Signs:   Temp:  [97.6 F (36.4 C)-98 F (36.7 C)] 97.6 F (36.4 C) (01/06 0539) Pulse Rate:  [59] 59 (01/06 0539) Resp:  [25-26] 26 (01/06 0539) BP: (107-122)/(55-69) 110/69 (01/06 0539) SpO2:  [94 %-96 %] 94 % (01/06 0539) Weight:  [76.8 kg (169 lb 5 oz)] 76.8 kg (169 lb 5 oz) (01/06 0539) Last BM Date: 06/09/17  Weight change: Filed Weights   06/11/17 0500 06/12/17 0559 06/13/17 0539  Weight: 77.1 kg (169 lb 15.6 oz) 77.2 kg (170 lb 3.1 oz) 76.8 kg (169 lb 5 oz)    Intake/Output:   Intake/Output Summary (Last 24 hours) at 06/13/2017 1005 Last data filed at 06/13/2017 0859 Gross per 24 hour  Intake 240 ml  Output  1725 ml  Net -1485 ml      Physical Exam    General:  Well appearing. No resp difficulty HEENT: normal Neck: supple. no JVD. Carotids 2+ bilat; no bruits. No lymphadenopathy or thryomegaly appreciated. Cor: PMI laterally displaced. Regular rate & rhythm. No rubs, gallops or murmurs. Lungs: clear Abdomen: soft, nontender, nondistended. No hepatosplenomegaly. No bruits or masses. Good bowel sounds. Extremities: no cyanosis, clubbing, rash, edema Neuro: alert & orientedx3, cranial nerves grossly intact. moves all 4 extremities w/o difficulty. Affect pleasant   Telemetry   Afib with BiV pacing at 60, personally reviewed.   Labs    CBC No results for input(s): WBC, NEUTROABS, HGB, HCT, MCV, PLT in the last 72 hours. Basic Metabolic Panel Recent Labs    40/98/11 0327 06/12/17 0340 06/13/17 0350  NA 137 138 131*  K 4.5 4.5 4.6  CL 93* 88* 82*  CO2 39* 40* 39*  GLUCOSE 134* 124* 143*  BUN 38* 40* 58*  CREATININE 1.50* 1.53* 2.03*  CALCIUM 9.0 9.8 9.9  MG 2.0  --   --    Liver Function Tests No results for input(s): AST, ALT, ALKPHOS, BILITOT, PROT, ALBUMIN in the last 72 hours. No results for input(s): LIPASE, AMYLASE in the last 72 hours. Cardiac Enzymes No results for input(s): CKTOTAL, CKMB, CKMBINDEX, TROPONINI in the last 72 hours.  BNP: BNP (last 3 results) Recent Labs  01/27/17 1046 02/01/17 1054 06/06/17 1037  BNP 136.7* 102.6* 486.4*    ProBNP (last 3 results) Recent Labs    05/25/17 1152  PROBNP 1,108*     D-Dimer No results for input(s): DDIMER in the last 72 hours. Hemoglobin A1C No results for input(s): HGBA1C in the last 72 hours. Fasting Lipid Panel No results for input(s): CHOL, HDL, LDLCALC, TRIG, CHOLHDL, LDLDIRECT in the last 72 hours. Thyroid Function Tests No results for input(s): TSH, T4TOTAL, T3FREE, THYROIDAB in the last 72 hours.  Invalid input(s): FREET3  Other results:   Imaging   No results found.  Medications:       Scheduled Medications: . apixaban  5 mg Oral BID  . atorvastatin  40 mg Oral Daily  . carvedilol  6.25 mg Oral BID WC  . famotidine  20 mg Oral BID  . fenofibrate  54 mg Oral Daily  . furosemide  80 mg Intravenous BID  . insulin aspart  0-5 Units Subcutaneous QHS  . insulin aspart  0-9 Units Subcutaneous TID WC  . loratadine  10 mg Oral Daily  . losartan  12.5 mg Oral BID  . mouth rinse  15 mL Mouth Rinse BID  . multivitamin with minerals  1 tablet Oral Daily  . nicotine  21 mg Transdermal Daily  . sodium chloride flush  3 mL Intravenous Q12H  . sodium chloride flush  3 mL Intravenous Q12H  . spironolactone  12.5 mg Oral Daily  . thiamine  100 mg Oral Daily    Infusions: . sodium chloride    . sodium chloride      PRN Medications: sodium chloride, sodium chloride, acetaminophen, guaiFENesin-dextromethorphan, naLOXone (NARCAN)  injection, ondansetron (ZOFRAN) IV, sodium chloride flush, sodium chloride flush    Patient Profile   [name] is a 72 y.o. 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    He was admitted on 12/30 with acute on chronic systolic and diastolic heart failure exacerbation.   Assessment/Plan   1. 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  MDT CRT-D. Still an active drinker. He was admitted with increased dyspnea, poor exercise tolerance. He has looked volume overloaded on exam and IV Lasix was started.  Initially, he did not diurese well and creatinine increased from 1.1 to 2 and diuretics held.  This was somewhat surprising.   - Remains on IV lasix and got one dose of metolazone yesterday. Weight down 1 pound but creatinine back up from 1.5 to 2.0. I think he is as dry as we can get him. Will  stop diuretics - Carvedilol cut back to 6.25 yesterday - On  low-dose losartan 12.5 bid. BP tolerating  - Needs ETOH cessation.  2. CAD: No s/s of ischemia.  Mild troponin increase with no trend, likely demand ischemia related to volume overload. Last cath in 2008 with medical management but with moderate left main stenosis.   - LHC 06/09/17 with 90% Left main and CTO RCA. Dr Excell Seltzer has seen and felt that it is too high risk with RCA blockage and PAD preventing Impella use.  - Denies CP today - Continue statin, no ASA given Eliquis use. Continue ASA 3. Atrial fibrillation: Chronic.  Had AV nodal ablation, now BiV pacing.  - continue Eliquis.  4. COPD: Still active smoker, 2 ppd.  On home oxygen.  - Continue oxygen per [place] - Needs to quit smoking. No change. 5. ETOH: Ongoing abuse. - Needs to stop completely.  6. MV replacement: Bioprosthetic. - Looked ok on last echo.  7. AKI:  - Creatinine back up to 2.0 today with diuresis. Stop diuretics.   8. Dementia:  - Suspect early dementia. No change.  9. DM2 - consider switching glipizide to Jardiance with CAD and HF  With AKI, I think this is as dry as we can get home. Would hold diuretics. I think he is ok to go today with close f/u or can watch one more day to ensure renal function improves after holding diuretics.   Daughter very oppositonal saying that we haven't done anything to improve his fatigue or change his medicines. I explained that we held [facility] in favor of losartan to help with BP. She said lipitor and fenofibrate also made him tired and then she said that he will be right back with fluid overload. I explained again that these medicines were unlikely to make him fatigued and that they needed to help him watch his diet so he wouldn't gain fluid back. I offered to decrease carvedilol but also told her that it was an important medicine for his heart. She called me a smartass and I ended the conversation saying that we were doing the  best we could do for him and that he will have f/u with Dr. Shirlee Latch who has left clear recommendations about his medications.    D/c HF meds: Carvedilol 6.25 bid - can decrease to 3.125 bid as needed Losartan 25 daily  (stop Entresto) Eliquis 5 bid Atorva 40  Spiro 12.5 daily Torsemide 60 daily  Metolazone 2.5 mg with kcl 20 for weight gain more than 3 pounds  F/u HF Clinic. Needs BMET this week.  Total time spent 35 minutes. Over half that time spent discussing above.    Length of Stay: 7  Arvilla Meres, MD  06/13/2017, 10:05 AM  Advanced Heart Failure Team Pager [phone] (M-F; 7a - 4p)  Please contact [facility] for night-coverage after hours (4p -7a ) and weekends on [email or web address]

## 2017-06-13 NOTE — Progress Notes (Signed)
The patient and his family have been given discharge instructions along with a new medication list and what to take today. He is discharging with his daughter via car.   Sheppard Evens RN

## 2017-06-13 NOTE — Discharge Summary (Signed)
Physician Discharge Summary   Patient ID: [name] MRN: [id number] DOB/AGE: 70/21/1947 70 y.o.  Admit date: 06/06/2017 Discharge date: 06/13/2017  Primary Care Physician:  Georgann Housekeeper, MD  Discharge Diagnoses:     Acute on chronic combined systolic and diastolic CHF  Mixed ischemic/nonischemic cardiomyopathy  Coronary artery disease  Nicotine abuse  Alcohol use . Hyperlipidemia . Hypertension . COPD GOLD 0 still smoking  . Cigarette smoker . Atrial fibrillation, chronic (HCC) Chronic hypoxic respiratory failure, 2 L O2   Consults: Cardiology  Recommendations for Outpatient Follow-up:  1. Please repeat CBC/BMET at next visit 2. Entresto discontinued 3. Torsemide 60 mg daily with additional 20 mg if over 20 pounds weight gain   DIET: Heart healthy diet    Allergies:   Allergies  Allergen Reactions  . Warfarin Other (See Comments)    Non compliance and ETOH abuse     DISCHARGE MEDICATIONS: Allergies as of 06/13/2017      Reactions   Warfarin Other (See Comments)   Non compliance and ETOH abuse      Medication List    STOP taking these medications   sacubitril-valsartan 49-51 MG Commonly known as:  ENTRESTO     TAKE these medications   ALLERGY 24-HR PO Take 1 tablet by mouth daily.   apixaban 5 MG Tabs tablet Commonly known as:  ELIQUIS Take 1 tablet (5 mg total) by mouth 2 (two) times daily.   atorvastatin 40 MG tablet Commonly known as:  LIPITOR Take 1 tablet (40 mg total) by mouth daily.   carvedilol 6.25 MG tablet Commonly known as:  COREG Take 1 tablet (6.25 mg total) by mouth 2 (two) times daily with a meal. What changed:    how much to take  how to take this  when to take this  additional instructions   fenofibrate 48 MG tablet Commonly known as:  TRICOR TAKE 1 TABLET BY MOUTH EVERY DAY   glipiZIDE 5 MG tablet Commonly known as:  GLUCOTROL Take 5 mg daily before breakfast by mouth.   guaiFENesin-dextromethorphan  100-10 MG/5ML syrup Commonly known as:  ROBITUSSIN DM Take 5 mLs by mouth every 4 (four) hours as needed for cough.   loratadine 10 MG tablet Commonly known as:  CLARITIN Take 1 tablet (10 mg total) by mouth daily. Start taking on:  06/14/2017   losartan 25 MG tablet Commonly known as:  COZAAR Take 1 tablet (25 mg total) by mouth daily.   multivitamin with minerals Tabs tablet Take 1 tablet by mouth daily.   nitroGLYCERIN 0.4 MG SL tablet Commonly known as:  NITROSTAT Place 1 tablet (0.4 mg total) under the tongue every 5 (five) minutes as needed for chest pain.   OXYGEN Inhale 2.5 L into the lungs.   ranitidine 150 MG tablet Commonly known as:  ZANTAC Take 150 mg by mouth daily as needed for heartburn.   spironolactone 25 MG tablet Commonly known as:  ALDACTONE Take 0.5 tablets (12.5 mg total) by mouth daily.   thiamine 100 MG tablet Take 1 tablet (100 mg total) by mouth daily.   torsemide 20 MG tablet Commonly known as:  DEMADEX Take 3 tablets (60 mg total) by mouth daily. Take 60mg  (3tabs) daily. Take extra 20mg  (1 tab) in afternoon if weight up more than 20lbs. Start taking on:  06/14/2017 What changed:  additional instructions        Brief H and P: For complete details please refer to admission H and P, but in  brief 72 y.o.malewith a medical history ofCHF, atrial fibrillation, coronary disease, who presents to the emergency department with complaints of increasing shortness of breath, fatigue, weight gain. Admitted for CHF exacerbation. Cardiology consulted.    Hospital Course:   Acute combined systolic and diastolic CHF exacerbation, CAD - presented with shortness of breath, fatigue, weight gain, BNP 486.4 on admission. -Chest x-ray showed pulmonary edema -Was recently placed on torsemide 2 weeks ago by cardiology. -Patient was placed on IV Lasix, increase to 80 mg twice a day, spironolactone restarted but held on 1/1 due to AK I.  Entresto discontinued. -  Cardiology following underwent cardiac cath showed severe distal left main stenosis, chronic occlusion of RCA, patency of LAD and left circumflex but with ostial involvement of both vessels.  Per Dr. Excell Seltzer, high risk for atherectomy and stenting of the left main into the LAD, not a candidate for CABG secondary to 2 prior sternotomies, severe COPD and mild dementia.  Recommended ongoing palliative medical therapy for patient's CAD.  -Continue statin, no aspirin given Eliquis use -Creatinine trended up to 2.0, cardiology recommended to hold diuretics today, restart torsemide outpatient tomorrow. -Patient will need repeat labs checked BMET in 1 week.     Atrial fibrillation, chronic (HCC) -Rate controlled, had AV nodal ablation, continue Eliquis    Hyperlipidemia -Continue Lipitor    Hypertension -Currently stable    COPD GOLD 0 still smoking, chronic hypoxic respiratory failure -Patient counseled on smoking cessation -Continue O2 2 L  Mild dementia -Currently at baseline  Acute kidney injury on chronic kidney disease stage III -Creatinine trended up to 2.0 -Entresto discontinued, hold torsemide today and restart outpatient tomorrow per cardiology recommendations.    Day of Discharge BP 110/69 (BP Location: Right Arm)   Pulse (!) 59   Temp 97.6 F (36.4 C) (Oral)   Resp (!) 26   Ht 5\' 7"  (1.702 m)   Wt 76.8 kg (169 lb 5 oz)   SpO2 94%   BMI 26.52 kg/m   Physical Exam: General: Alert and awake oriented x3 not in any acute distress. HEENT: anicteric sclera, pupils reactive to light and accommodation CVS: S1-S2 clear no murmur rubs or gallops Chest: clear to auscultation bilaterally, no wheezing rales or rhonchi Abdomen: soft nontender, nondistended, normal bowel sounds Extremities: no cyanosis, clubbing or edema noted bilaterally Neuro: Cranial nerves II-XII intact, no focal neurological deficits   The results of significant diagnostics from this hospitalization  (including imaging, microbiology, ancillary and laboratory) are listed below for reference.    LAB RESULTS: Basic Metabolic Panel: Recent Labs  Lab 06/11/17 0327 06/12/17 0340 06/13/17 0350  NA 137 138 131*  K 4.5 4.5 4.6  CL 93* 88* 82*  CO2 39* 40* 39*  GLUCOSE 134* 124* 143*  BUN 38* 40* 58*  CREATININE 1.50* 1.53* 2.03*  CALCIUM 9.0 9.8 9.9  MG 2.0  --   --    Liver Function Tests: No results for input(s): AST, ALT, ALKPHOS, BILITOT, PROT, ALBUMIN in the last 168 hours. No results for input(s): LIPASE, AMYLASE in the last 168 hours. Recent Labs  Lab 06/09/17 1043  AMMONIA 48*   CBC: Recent Labs  Lab 06/09/17 0255 06/10/17 0404  WBC 11.3* 11.2*  HGB 11.8* 11.1*  HCT 39.2 38.9*  MCV 95.4 95.8  PLT 214 210   Cardiac Enzymes: Recent Labs  Lab 06/07/17 1312 06/07/17 1914  TROPONINI 0.05* 0.05*   BNP: Invalid input(s): POCBNP CBG: Recent Labs  Lab 06/13/17 0754 06/13/17 1130  GLUCAP 170* 243*    Significant Diagnostic Studies:  Dg Chest 2 View  Result Date: 06/06/2017 CLINICAL DATA:  Daughter stated, He is been sleeping for about 23 hours a day will go to the BR and then go back to bed. Eating but not as much as usual. Has Hx of copd,cad.ckd EXAM: CHEST  2 VIEW COMPARISON:  12/08/2016 FINDINGS: Prior median sternotomy. Pacer/AICD device. Mitral valve replacement. Midline trachea. Moderate cardiomegaly. Atherosclerosis in the transverse aorta. No right and no definite left pleural fluid. Remote right rib trauma. Moderate pulmonary interstitial prominence and indistinctness, progressive. Left pleural calcifications. Mild right hemidiaphragm elevation. IMPRESSION: Cardiomegaly with progressive pulmonary interstitial prominence. Favored to represent mild pulmonary venous congestion, superimposed upon the sequelae of smoking/ chronic bronchitis. Aortic Atherosclerosis (ICD10-I70.0). Electronically Signed   By: Jeronimo Greaves M.D.   On: 06/06/2017 13:53    2D  ECHO:   Disposition and Follow-up: Discharge Instructions    (HEART FAILURE PATIENTS) Call MD:  Anytime you have any of the following symptoms: 1) 3 pound weight gain in 24 hours or 5 pounds in 1 week 2) shortness of breath, with or without a dry hacking cough 3) swelling in the hands, feet or stomach 4) if you have to sleep on extra pillows at night in order to breathe.   Complete by:  As directed    Diet - low sodium heart healthy   Complete by:  As directed    Increase activity slowly   Complete by:  As directed        DISPOSITION:home    DISCHARGE FOLLOW-UP Follow-up Information    [facility]. Go on 06/22/2017.   Specialty:  Cardiology Why:  11:00 AM, [facility], parking code [id number] Contact information: [address] [id number] [place] [address] [phone]           Time spent on Discharge:   Signed:   Thad Ranger M.D. [name] 06/13/2017, 12:34 PM Pager: [phone]

## 2017-06-14 ENCOUNTER — Telehealth: Payer: Self-pay | Admitting: Internal Medicine

## 2017-06-14 NOTE — Telephone Encounter (Signed)
Spoke with pt's daughter [name] (dpr on file), states that pt was recently placed on O2, pt is wanting an order for a POC.  I advised that we have not seen pt in over 1 year, will need to be seen before any orders can be placed.  Scheduled rov.  Nothing further needed at this time.

## 2017-06-21 ENCOUNTER — Encounter: Payer: Self-pay | Admitting: Internal Medicine

## 2017-06-21 ENCOUNTER — Ambulatory Visit (INDEPENDENT_AMBULATORY_CARE_PROVIDER_SITE_OTHER)
Admission: RE | Admit: 2017-06-21 | Discharge: 2017-06-21 | Disposition: A | Discharge status: Home / Self Care | Payer: Medicare Other | Source: Ambulatory Visit | Attending: Internal Medicine | Admitting: Internal Medicine

## 2017-06-21 ENCOUNTER — Ambulatory Visit: Payer: Medicare Other | Admitting: Internal Medicine

## 2017-06-21 VITALS — BP 118/76 | HR 61 | Ht 67.0 in | Wt 177.0 lb

## 2017-06-21 DIAGNOSIS — J449 Chronic obstructive pulmonary disease, unspecified: Secondary | ICD-10-CM

## 2017-06-21 DIAGNOSIS — J9611 Chronic respiratory failure with hypoxia: Secondary | ICD-10-CM

## 2017-06-21 DIAGNOSIS — J9612 Chronic respiratory failure with hypercapnia: Secondary | ICD-10-CM
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
## 2017-06-21 NOTE — Patient Instructions (Addendum)
Please remember to go to the  x-ray department downstairs in the basement  for your tests - we will call you with the results when they are available.  Continue 2 lpm 24/7 for now with goal of keeps his 02 sats > 90%   Pulmonary follow up  as needed

## 2017-06-22 ENCOUNTER — Encounter (HOSPITAL_COMMUNITY): Payer: Self-pay

## 2017-06-22 ENCOUNTER — Encounter: Payer: Self-pay | Admitting: Internal Medicine

## 2017-06-22 ENCOUNTER — Ambulatory Visit (HOSPITAL_COMMUNITY)
Admit: 2017-06-22 | Discharge: 2017-06-22 | Disposition: A | Discharge status: Home / Self Care | Payer: Medicare Other | Attending: Cardiology | Admitting: Cardiology

## 2017-06-22 VITALS — BP 126/54 | HR 87 | Wt 176.2 lb

## 2017-06-22 DIAGNOSIS — F329 Major depressive disorder, single episode, unspecified: Secondary | ICD-10-CM | POA: Insufficient documentation

## 2017-06-22 DIAGNOSIS — I2510 Atherosclerotic heart disease of native coronary artery without angina pectoris: Secondary | ICD-10-CM | POA: Diagnosis not present

## 2017-06-22 DIAGNOSIS — Z8249 Family history of ischemic heart disease and other diseases of the circulatory system: Secondary | ICD-10-CM | POA: Insufficient documentation

## 2017-06-22 DIAGNOSIS — Z955 Presence of coronary angioplasty implant and graft: Secondary | ICD-10-CM | POA: Diagnosis not present

## 2017-06-22 DIAGNOSIS — F0390 Unspecified dementia without behavioral disturbance: Secondary | ICD-10-CM | POA: Diagnosis not present

## 2017-06-22 DIAGNOSIS — F319 Bipolar disorder, unspecified: Secondary | ICD-10-CM | POA: Insufficient documentation

## 2017-06-22 DIAGNOSIS — Z952 Presence of prosthetic heart valve: Secondary | ICD-10-CM | POA: Insufficient documentation

## 2017-06-22 DIAGNOSIS — Z87891 Personal history of nicotine dependence: Secondary | ICD-10-CM | POA: Diagnosis not present

## 2017-06-22 DIAGNOSIS — I5022 Chronic systolic (congestive) heart failure: Secondary | ICD-10-CM | POA: Insufficient documentation

## 2017-06-22 DIAGNOSIS — I428 Other cardiomyopathies: Secondary | ICD-10-CM | POA: Insufficient documentation

## 2017-06-22 DIAGNOSIS — J449 Chronic obstructive pulmonary disease, unspecified: Secondary | ICD-10-CM | POA: Insufficient documentation

## 2017-06-22 DIAGNOSIS — E785 Hyperlipidemia, unspecified: Secondary | ICD-10-CM | POA: Diagnosis not present

## 2017-06-22 DIAGNOSIS — F1010 Alcohol abuse, uncomplicated: Secondary | ICD-10-CM | POA: Insufficient documentation

## 2017-06-22 DIAGNOSIS — Z8673 Personal history of transient ischemic attack (TIA), and cerebral infarction without residual deficits: Secondary | ICD-10-CM | POA: Diagnosis not present

## 2017-06-22 DIAGNOSIS — Z9889 Other specified postprocedural states: Secondary | ICD-10-CM | POA: Diagnosis not present

## 2017-06-22 DIAGNOSIS — Z7901 Long term (current) use of anticoagulants: Secondary | ICD-10-CM | POA: Diagnosis not present

## 2017-06-22 DIAGNOSIS — Z79899 Other long term (current) drug therapy: Secondary | ICD-10-CM | POA: Diagnosis not present

## 2017-06-22 DIAGNOSIS — J9612 Chronic respiratory failure with hypercapnia: Secondary | ICD-10-CM

## 2017-06-22 DIAGNOSIS — Z86718 Personal history of other venous thrombosis and embolism: Secondary | ICD-10-CM | POA: Insufficient documentation

## 2017-06-22 DIAGNOSIS — Z9981 Dependence on supplemental oxygen: Secondary | ICD-10-CM | POA: Diagnosis not present

## 2017-06-22 DIAGNOSIS — I110 Hypertensive heart disease with heart failure: Secondary | ICD-10-CM | POA: Insufficient documentation

## 2017-06-22 DIAGNOSIS — Z7984 Long term (current) use of oral hypoglycemic drugs: Secondary | ICD-10-CM | POA: Diagnosis not present

## 2017-06-22 DIAGNOSIS — I482 Chronic atrial fibrillation, unspecified: Secondary | ICD-10-CM

## 2017-06-22 DIAGNOSIS — J9611 Chronic respiratory failure with hypoxia: Secondary | ICD-10-CM | POA: Insufficient documentation

## 2017-06-22 LAB — BASIC METABOLIC PANEL
Anion gap: 10 (ref 5–15)
BUN: 52 mg/dL — AB (ref 6–20)
CALCIUM: 9.2 mg/dL (ref 8.9–10.3)
CO2: 31 mmol/L (ref 22–32)
CREATININE: 1.59 mg/dL — AB (ref 0.61–1.24)
Chloride: 93 mmol/L — ABNORMAL LOW (ref 101–111)
GFR calc Af Amer: 49 mL/min — ABNORMAL LOW (ref >60)
GFR, EST NON AFRICAN AMERICAN: 43 mL/min — AB (ref >60)
GLUCOSE: 211 mg/dL — AB (ref 65–99)
Potassium: 4.7 mmol/L (ref 3.5–5.1)
Sodium: 134 mmol/L — ABNORMAL LOW (ref 135–145)

## 2017-06-22 LAB — LIPID PANEL
CHOL/HDL RATIO: 3.5 ratio
CHOLESTEROL: 135 mg/dL (ref 0–200)
HDL: 39 mg/dL — ABNORMAL LOW (ref >40)
LDL Cholesterol: 59 mg/dL (ref 0–99)
Triglycerides: 187 mg/dL — ABNORMAL HIGH (ref <150)
VLDL: 37 mg/dL (ref 0–40)

## 2017-06-22 LAB — CBC
HEMATOCRIT: 37.7 % — AB (ref 39.0–52.0)
HEMOGLOBIN: 11.6 g/dL — AB (ref 13.0–17.0)
MCH: 28.4 pg (ref 26.0–34.0)
MCHC: 30.8 g/dL (ref 30.0–36.0)
MCV: 92.2 fL (ref 78.0–100.0)
PLATELETS: 171 10*3/uL (ref 150–400)
RBC: 4.09 MIL/uL — AB (ref 4.22–5.81)
RDW: 13.5 % (ref 11.5–15.5)
WBC: 10.1 10*3/uL (ref 4.0–10.5)

## 2017-06-22 NOTE — Progress Notes (Signed)
LMTCB

## 2017-06-22 NOTE — Assessment & Plan Note (Signed)
Spirometry 10/11/15  Restrictive only  - 03/31/2016  Walked RA x 3 laps @ 185 ft each stopped due to  End of study, nl pace, no sob or desat    - reported quit smoking 06/2017    Now that quit smoking should see improvement clinically but not evidence of copd progression or need for pulmonary rx other than with 02 (see separate a/p)    Discussed with pt/ fm   I reviewed the Fletcher curve with the patient that basically indicates  if you quit smoking when your best day FEV1 is still well preserved (as is clearly  the case here)  it is highly unlikely you will progress to severe disease and informed the patient there was  no medication on the market that has proven to alter the curve/ its downward trajectory  or the likelihood of progression of their disease(unlike other chronic medical conditions such as atheroclerosis where we do think we can change the natural hx with risk reducing meds)    Therefore stopping smoking and maintaining abstinence are  the most important aspects of care, not choice of inhalers or for that matter, doctors.   Treatment other than smoking cessation  is entirely directed by severity of symptoms and focused also on reducing exacerbations, not attempting to change the natural history of the disease.     Pulmonary f/u can be prn   I had an extended discussion with the patient reviewing all relevant studies completed to date and  lasting 15 to 20 minutes of a 25 minute transition of care office visit    Each maintenance medication was reviewed in detail including most importantly the difference between maintenance and prns and under what circumstances the prns are to be triggered using an action plan format that is not reflected in the computer generated alphabetically organized AVS.    Please see AVS for specific instructions unique to this visit that I personally wrote and verbalized to the the pt in detail and then reviewed with pt  by my nurse highlighting any  changes in  therapy recommended at today's visit to their plan of care.

## 2017-06-22 NOTE — Assessment & Plan Note (Signed)
HC03  06/13/17 = 39  C/w chronic at least moderate hypercapnea   Therefore target sats are low 90's

## 2017-06-22 NOTE — Patient Instructions (Signed)
Will refer you to Cardiac Rehab. They will call you to set up initial appointment.  Routine lab work today. Will notify you of abnormal results, otherwise no news is good news!  Follow up with Dr. Shirlee Latch in 1 month.  Take all medication as prescribed the day of your appointment. Bring all medications with you to your appointment.  Do the following things EVERYDAY: 1) Weigh yourself in the morning before breakfast. Write it down and keep it in a log. 2) Take your medicines as prescribed 3) Eat low salt foods-Limit salt (sodium) to 2000 mg per day.  4) Stay as active as you can everyday 5) Limit all fluids for the day to less than 2 liters
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
## 2017-06-25 ENCOUNTER — Encounter (HOSPITAL_COMMUNITY): Payer: Self-pay

## 2017-06-28 ENCOUNTER — Telehealth (HOSPITAL_COMMUNITY): Payer: Self-pay

## 2017-06-28 NOTE — Telephone Encounter (Signed)
Called and spoke with patient in regards to Cardiac Rehab - Patient is interested in the program. Scheduled orientation on 08/03/2017 at 8:30am. Patient will attend the 11:15am exc class.

## 2017-06-28 NOTE — Telephone Encounter (Signed)
Patients insurance is active and benefits verified through [place] [employer] - $20.00 co-pay, no deductible, out of pocket amount of $6,700/$69.65 has been met, no co-insurance, and no pre-authorization is required. Passport/reference [id number]  Patient will be contacted and scheduled.

## 2017-07-15 ENCOUNTER — Encounter (HOSPITAL_COMMUNITY): Payer: Medicare Other

## 2017-07-16 ENCOUNTER — Telehealth (HOSPITAL_COMMUNITY): Payer: Self-pay | Admitting: Vascular Surgery

## 2017-07-16 NOTE — Telephone Encounter (Signed)
Patient daughter called triage and I spoke with her.  She was asking for her dad to see Dr. Shirlee Latch today.  I explained that Dr. Shirlee Latch is booked and asked what was going on.  All she could tell me was that he wasn't feeling good.  No sob, no swelling, no chest.    I explained to her that she can take him to an urgent care if needed because just "not feeling well" didn't sound cardiac related.  She understands and no further questions.

## 2017-07-16 NOTE — Telephone Encounter (Signed)
Pt daughter left VM to see McLEAN today, she does not want her dad to see Amy .Marland Kitchen Please advise

## 2017-07-21 ENCOUNTER — Ambulatory Visit (INDEPENDENT_AMBULATORY_CARE_PROVIDER_SITE_OTHER): Payer: Medicare Other | Admitting: *Deleted

## 2017-07-21 DIAGNOSIS — I255 Ischemic cardiomyopathy: Secondary | ICD-10-CM | POA: Diagnosis not present

## 2017-07-21 DIAGNOSIS — I5022 Chronic systolic (congestive) heart failure: Secondary | ICD-10-CM

## 2017-07-21 NOTE — Progress Notes (Signed)
Remote ICD transmission.   

## 2017-07-22 ENCOUNTER — Encounter: Payer: Self-pay | Admitting: Cardiology

## 2017-07-27 ENCOUNTER — Encounter (HOSPITAL_COMMUNITY): Payer: Medicare Other | Admitting: Cardiology

## 2017-07-30 ENCOUNTER — Telehealth (HOSPITAL_COMMUNITY): Payer: Self-pay

## 2017-07-31 LAB — CUP PACEART REMOTE DEVICE CHECK
Battery Remaining Longevity: 27 mo
Brady Statistic RA Percent Paced: 0 %
Brady Statistic RV Percent Paced: 96.08 %
HighPow Impedance: 40 Ohm
HighPow Impedance: 48 Ohm
Implantable Lead Implant Date: 20060307
Implantable Lead Implant Date: 20080110
Implantable Lead Location: 753860
Implantable Lead Model: 5076
Implantable Pulse Generator Implant Date: 20150827
Lead Channel Impedance Value: 4047 Ohm
Lead Channel Impedance Value: 513 Ohm
Lead Channel Pacing Threshold Amplitude: 0.875 V
Lead Channel Pacing Threshold Pulse Width: 0.4 ms
Lead Channel Sensing Intrinsic Amplitude: 0.875 mV
Lead Channel Sensing Intrinsic Amplitude: 0.875 mV
Lead Channel Sensing Intrinsic Amplitude: 24.875 mV
Lead Channel Setting Pacing Amplitude: 2.5 V
Lead Channel Setting Pacing Pulse Width: 0.4 ms
MDC IDC LEAD IMPLANT DT: 20090617
MDC IDC LEAD LOCATION: 753858
MDC IDC LEAD LOCATION: 753859
MDC IDC MSMT BATTERY VOLTAGE: 2.95 V
MDC IDC MSMT LEADCHNL LV IMPEDANCE VALUE: 380 Ohm
MDC IDC MSMT LEADCHNL LV IMPEDANCE VALUE: 4047 Ohm
MDC IDC MSMT LEADCHNL LV PACING THRESHOLD AMPLITUDE: 1.125 V
MDC IDC MSMT LEADCHNL LV PACING THRESHOLD PULSEWIDTH: 0.4 ms
MDC IDC MSMT LEADCHNL RA PACING THRESHOLD AMPLITUDE: 0.625 V
MDC IDC MSMT LEADCHNL RA PACING THRESHOLD PULSEWIDTH: 0.4 ms
MDC IDC MSMT LEADCHNL RV IMPEDANCE VALUE: 494 Ohm
MDC IDC MSMT LEADCHNL RV IMPEDANCE VALUE: 513 Ohm
MDC IDC MSMT LEADCHNL RV SENSING INTR AMPL: 24.875 mV
MDC IDC SESS DTM: 20190213062404
MDC IDC SET LEADCHNL LV PACING PULSEWIDTH: 0.4 ms
MDC IDC SET LEADCHNL RV PACING AMPLITUDE: 2.5 V
MDC IDC SET LEADCHNL RV SENSING SENSITIVITY: 0.45 mV
MDC IDC STAT BRADY AP VP PERCENT: 0 %
MDC IDC STAT BRADY AP VS PERCENT: 0 %
MDC IDC STAT BRADY AS VP PERCENT: 94.3 %
MDC IDC STAT BRADY AS VS PERCENT: 5.7 %

## 2017-08-02 ENCOUNTER — Telehealth (HOSPITAL_COMMUNITY): Payer: Self-pay

## 2017-08-02 NOTE — Telephone Encounter (Signed)
Cardiac Rehab - Pharmacy Resident Documentation   Patient unable to be reached after three call attempts. Please complete allergy verification and medication review during patient's cardiac rehab appointment.    Diana L. Marcy Salvo, PharmD, MS PGY1 Pharmacy Resident Pager: [phone]

## 2017-08-03 ENCOUNTER — Telehealth (HOSPITAL_COMMUNITY): Payer: Self-pay | Admitting: *Deleted

## 2017-08-03 ENCOUNTER — Encounter (HOSPITAL_COMMUNITY): Payer: Self-pay

## 2017-08-03 ENCOUNTER — Encounter (HOSPITAL_COMMUNITY)
Admission: RE | Admit: 2017-08-03 | Discharge: 2017-08-03 | Disposition: A | Discharge status: Home / Self Care | Payer: Medicare Other | Source: Ambulatory Visit | Attending: Cardiology | Admitting: Cardiology

## 2017-08-03 VITALS — Ht 65.5 in | Wt 168.4 lb

## 2017-08-03 DIAGNOSIS — I5022 Chronic systolic (congestive) heart failure: Secondary | ICD-10-CM | POA: Diagnosis not present

## 2017-08-03 DIAGNOSIS — I5020 Unspecified systolic (congestive) heart failure: Secondary | ICD-10-CM | POA: Diagnosis present

## 2017-08-03 LAB — GLUCOSE, CAPILLARY: Glucose-Capillary: 255 mg/dL — ABNORMAL HIGH (ref 65–99)

## 2017-08-03 NOTE — Progress Notes (Signed)
Cardiac Individual Treatment Plan  Patient Details  Name: [name] MRN: [id number] Date of Birth: 1946-01-14 Referring Provider:     CARDIAC REHAB PHASE II ORIENTATION from 08/03/2017 in [facility] CARDIAC [facility]  Referring Provider  Marca Ancona MD      Initial Encounter Date:    CARDIAC REHAB PHASE II ORIENTATION from 08/03/2017 in [facility] CARDIAC REHAB  Date  08/03/17  Referring Provider  Marca Ancona MD      Visit Diagnosis: Chronic systolic congestive heart failure (HCC)  Patient's Home Medications on Admission:  Current Outpatient Medications:  .  apixaban (ELIQUIS) 5 MG TABS tablet, Take 1 tablet (5 mg total) by mouth 2 (two) times daily., Disp: 180 tablet, Rfl: 3 .  atorvastatin (LIPITOR) 40 MG tablet, Take 1 tablet (40 mg total) by mouth daily., Disp: 30 tablet, Rfl: 0 .  carvedilol (COREG) 6.25 MG tablet, Take 1 tablet (6.25 mg total) by mouth 2 (two) times daily with a meal., Disp: 60 tablet, Rfl: 3 .  guaiFENesin-dextromethorphan (ROBITUSSIN DM) 100-10 MG/5ML syrup, Take 5 mLs by mouth every 4 (four) hours as needed for cough., Disp: 118 mL, Rfl: 0 .  loratadine (CLARITIN) 10 MG tablet, Take 1 tablet (10 mg total) by mouth daily., Disp: 30 tablet, Rfl: 1 .  losartan (COZAAR) 25 MG tablet, Take 1 tablet (25 mg total) by mouth daily., Disp: 30 tablet, Rfl: 3 .  Multiple Vitamin (MULTIVITAMIN WITH MINERALS) TABS tablet, Take 1 tablet by mouth daily., Disp: 30 tablet, Rfl: 2 .  nitroGLYCERIN (NITROSTAT) 0.4 MG SL tablet, Place 1 tablet (0.4 mg total) under the tongue every 5 (five) minutes as needed for chest pain., Disp: 25 tablet, Rfl: 2 .  OXYGEN, Inhale 2.5 L into the lungs., Disp: , Rfl:  .  ranitidine (ZANTAC) 150 MG tablet, Take 150 mg by mouth daily as needed for heartburn., Disp: , Rfl:  .  spironolactone (ALDACTONE) 25 MG tablet, Take 0.5 tablets (12.5 mg total) by mouth daily., Disp: 45 tablet, Rfl: 3 .  thiamine 100  MG tablet, Take 1 tablet (100 mg total) by mouth daily., Disp: 30 tablet, Rfl: 0 .  torsemide (DEMADEX) 20 MG tablet, Take 3 tablets (60 mg total) by mouth daily. Take 60mg  (3tabs) daily. Take extra 20mg  (1 tab) in afternoon if weight up more than 20lbs., Disp: 120 tablet, Rfl: 3 .  Fexofenadine HCl (ALLERGY 24-HR PO), Take 1 tablet by mouth daily., Disp: , Rfl:  .  glipiZIDE (GLUCOTROL) 5 MG tablet, Take 5 mg daily before breakfast by mouth., Disp: , Rfl:   Past Medical History: Past Medical History:  Diagnosis Date  . AICD (automatic cardioverter/defibrillator) present   . Alcohol abuse   . Anxiety   . Bipolar disorder (HCC)   . Biventricular automatic implantable cardioverter defibrillator in situ    a. 01/2014 s/p MDT [id number] Viva XT CRT-D (ser # [id number]).  . CAD (coronary artery disease)    a. s/p prior PCI/stenting of the LCX;  b. 08/2014 MV: EF 20%, large septal, apical, and inferior infarct from apex to base, no ischemia;  c. 08/2014 NSTEMI/Cath: LM mod distal dzs extending into ostial LCX (80%), LAD tortuous, RI nl, OM mod dzs, RCA 100 CTO with R->R and L->R collats-->Med Rx.  . Chronic atrial fibrillation (HCC)    a. CHA2DS2VASc = 6->eliquis;  b. S/P AVN RFCA an BiV ICD placement.  . Chronic respiratory failure (HCC)   . Chronic systolic congestive heart  failure (HCC)    a. 01/2014 Echo: EF 25-30%, mod conc LVH, mod dil LA.  . CKD (chronic kidney disease)    a. suspect stage II-III based on historical labs.  . Complete heart block (HCC)    a. In setting of prior AV nodal ablation r/t afib-->BiV ICD (01/2014).  . COPD (chronic obstructive pulmonary disease) (HCC)   . CVA (cerebral vascular accident) (HCC)    a. Multiple prior embolic strokes.  . Dementia   . Depression   . DVT (deep venous thrombosis) (HCC)   . Dyslipidemia   . Hyperglycemia   . Hypertension   . Mitral valve disease    a. remote mitral replacement with Bjork Shiley valve;  b. 06/2006 Redo MVR with tissue valve.    . Mixed Ischemic and Nonischemic Cardiomyopathy    a. 8/.2015 Echo: Ef 25-30%;  b. 01/2014 s/p MDT [id number] Talbot Grumbling XT CRT-D (ser # [id number]).  . On home oxygen therapy    "3L at night" (11/23/2015)  . Psoriasis    "back of his head; elbows; finger of left hand" (06/07/2017)  . Ventral hernia     Tobacco Use: Social History   Tobacco Use  Smoking Status Former Smoker  . Packs/day: 2.00  . Years: 42.00  . Pack years: 84.00  . Types: Cigarettes  Smokeless Tobacco Never Used    Labs: Recent Review Flowsheet Data    Labs for ITP Cardiac and Pulmonary Rehab Latest Ref Rng & Units 02/12/2017 06/09/2017 06/09/2017 06/09/2017 06/22/2017   Cholestrol 0 - 200 mg/dL - - - - 119   LDLCALC 0 - 99 mg/dL - - - - 59   HDL >14 mg/dL - - - - 78(G)   Trlycerides <150 mg/dL - - - - 956(O)   Hemoglobin A1c 4.8 - 5.6 % 7.8(H) - - - -   PHART 7.350 - 7.450 - - - 7.269(L) -   PCO2ART 32.0 - 48.0 mmHg - - - 77.6(HH) -   HCO3 20.0 - 28.0 mmol/L - 36.1(H) 34.9(H) 34.4(H) -   TCO2 22 - 32 mmol/L - 38(H) 37(H) - -   ACIDBASEDEF 0.0 - 2.0 mmol/L - - - - -   O2SAT % - 63.0 65.0 96.2 -      Capillary Blood Glucose: Lab Results  Component Value Date   GLUCAP 255 (H) 08/03/2017   GLUCAP 243 (H) 06/13/2017   GLUCAP 170 (H) 06/13/2017   GLUCAP 148 (H) 06/12/2017   GLUCAP 214 (H) 06/12/2017     Exercise Target Goals: Date: 08/03/17  Exercise Program Goal: Individual exercise prescription set using results from initial 6 min walk test and THRR while considering  patient's activity barriers and safety.   Exercise Prescription Goal: Initial exercise prescription builds to 30-45 minutes a day of aerobic activity, 2-3 days per week.  Home exercise guidelines will be given to patient during program as part of exercise prescription that the participant will acknowledge.  Activity Barriers & Risk Stratification: Activity Barriers & Cardiac Risk Stratification - 08/03/17 0957      Activity Barriers & Cardiac Risk  Stratification   Activity Barriers  Deconditioning;Muscular Weakness;Shortness of Breath;Other (comment);Back Problems    Comments  B foot pain    Cardiac Risk Stratification  High       6 Minute Walk: 6 Minute Walk    Row Name 08/03/17 1058 08/03/17 1128 08/03/17 1156     6 Minute Walk   Phase  Initial  -  Initial  Distance  628 feet  -  628 feet   Walk Time  -  -  4.29 minutes   # of Rest Breaks  1  -  1   MPH  1.18  -  1.7   METS  2.7  -  18   RPE  11  -  11   Perceived Dyspnea   1  -  1   VO2 Peak  9.6  -  5.67   Symptoms  Yes (comment) fatigue, dyspnea  Yes (comment)  Yes (comment)   Comments  -  -  fatigue, dyspnea; short rest break for 1 minute and 31 seconds, ambulated with 3Liters   Resting HR  60 bpm  -  60 bpm   Resting BP  104/60  -  104/60   Resting Oxygen Saturation   86 % RA, up to 98% 3L  -  86 %   Exercise Oxygen Saturation  during 6 min walk  94 %  -  94 %   Max Ex. HR  99 bpm  -  99 bpm   Max Ex. BP  122/60  -  122/60   2 Minute Post BP  100/60  -  100/60      Oxygen Initial Assessment: Oxygen Initial Assessment - 08/03/17 0937      Home Oxygen   Home Oxygen Device  Home Concentrator    Sleep Oxygen Prescription  Continuous    Liters per minute  2    Home Exercise Oxygen Prescription  Continuous    Liters per minute  2    Compliance with Home Oxygen Use  No       Oxygen Re-Evaluation:   Oxygen Discharge (Final Oxygen Re-Evaluation):   Initial Exercise Prescription: Initial Exercise Prescription - 08/03/17 1100      Date of Initial Exercise RX and Referring Provider   Date  08/03/17    Referring Provider  Marca Ancona MD      NuStep   Level  1    SPM  60    Minutes  10    METs  1.5      Arm Ergometer   Level  1    Minutes  10    METs  1.5      Track   Laps  6    Minutes  10    METs  2.03      Prescription Details   Frequency (times per week)  3    Duration  Progress to 30 minutes of continuous aerobic without  signs/symptoms of physical distress      Intensity   THRR 40-80% of Max Heartrate  60-120    Ratings of Perceived Exertion  11-15    Perceived Dyspnea  0-4      Progression   Progression  Continue to progress workloads to maintain intensity without signs/symptoms of physical distress.      Resistance Training   Training Prescription  Yes    Weight  1lb    Reps  10-15       Perform Capillary Blood Glucose checks as needed.  Exercise Prescription Changes:   Exercise Comments:   Exercise Goals and Review: Exercise Goals    Row Name 08/03/17 0957             Exercise Goals   Increase Physical Activity  Yes       Intervention  Provide advice, education, support and counseling about physical activity/exercise needs.;Develop an individualized  exercise prescription for aerobic and resistive training based on initial evaluation findings, risk stratification, comorbidities and participant's personal goals.       Expected Outcomes  Short Term: Attend rehab on a regular basis to increase amount of physical activity.;Long Term: Exercising regularly at least 3-5 days a week.;Long Term: Add in home exercise to make exercise part of routine and to increase amount of physical activity.       Increase Strength and Stamina  Yes improve energy levels to maintain independence       Intervention  Provide advice, education, support and counseling about physical activity/exercise needs.;Develop an individualized exercise prescription for aerobic and resistive training based on initial evaluation findings, risk stratification, comorbidities and participant's personal goals.       Expected Outcomes  Short Term: Increase workloads from initial exercise prescription for resistance, speed, and METs.;Long Term: Improve cardiorespiratory fitness, muscular endurance and strength as measured by increased METs and functional capacity ( );Short Term: Perform resistance training exercises routinely during  rehab and add in resistance training at home       Able to understand and use rate of perceived exertion (RPE) scale  Yes       Intervention  Provide education and explanation on how to use RPE scale       Expected Outcomes  Short Term: Able to use RPE daily in rehab to express subjective intensity level;Long Term:  Able to use RPE to guide intensity level when exercising independently       Able to understand and use Dyspnea scale  Yes       Intervention  Provide education and explanation on how to use Dyspnea scale       Expected Outcomes  Short Term: Able to use Dyspnea scale daily in rehab to express subjective sense of shortness of breath during exertion;Long Term: Able to use Dyspnea scale to guide intensity level when exercising independently       Knowledge and understanding of Target Heart Rate Range (THRR)  Yes       Intervention  Provide education and explanation of THRR including how the numbers were predicted and where they are located for reference       Expected Outcomes  Short Term: Able to state/look up THRR;Long Term: Able to use THRR to govern intensity when exercising independently;Short Term: Able to use daily as guideline for intensity in rehab       Able to check pulse independently  Yes       Intervention  Provide education and demonstration on how to check pulse in carotid and radial arteries.;Review the importance of being able to check your own pulse for safety during independent exercise       Expected Outcomes  Short Term: Able to explain why pulse checking is important during independent exercise;Long Term: Able to check pulse independently and accurately       Understanding of Exercise Prescription  Yes       Intervention  Provide education, explanation, and written materials on patient's individual exercise prescription       Expected Outcomes  Short Term: Able to explain program exercise prescription;Long Term: Able to explain home exercise prescription to exercise  independently          Exercise Goals Re-Evaluation :    Discharge Exercise Prescription (Final Exercise Prescription Changes):   Nutrition:  Target Goals: Understanding of nutrition guidelines, daily intake of sodium 1500mg , cholesterol 200mg , calories 30% from fat and 7% or less from saturated  fats, daily to have 5 or more servings of fruits and vegetables.  Biometrics: Pre Biometrics - 08/03/17 1126      Pre Biometrics   Height  5' 5.5" (1.664 m)    Weight  168 lb 6.9 oz (76.4 kg)    Waist Circumference  42.5 inches    Hip Circumference  39.25 inches    Waist to Hip Ratio  1.08 %    BMI (Calculated)  27.59    Triceps Skinfold  22 mm    % Body Fat  30.7 %    Grip Strength  22 kg    Flexibility  7.75 in        Nutrition Therapy Plan and Nutrition Goals:   Nutrition Assessments:   Nutrition Goals Re-Evaluation:   Nutrition Goals Re-Evaluation:   Nutrition Goals Discharge (Final Nutrition Goals Re-Evaluation):   Psychosocial: Target Goals: Acknowledge presence or absence of significant depression and/or stress, maximize coping skills, provide positive support system. Participant is able to verbalize types and ability to use techniques and skills needed for reducing stress and depression.  Initial Review & Psychosocial Screening: Initial Psych Review & Screening - 08/03/17 1111      Initial Review   Current issues with  Current Anxiety/Panic;Current Stress Concerns    Source of Stress Concerns  Family;Unable to participate in former interests or hobbies;Unable to perform yard/household activities;Financial;Transportation;Chronic Illness;Poor Coping Skills      Family Dynamics   Good Support System?  Yes    Comments  situational family stress, adult son overdependent financially      Barriers   Psychosocial barriers to participate in program  Psychosocial barriers identified (see note) memory      Screening Interventions   Interventions  Encouraged to  exercise;To provide support and resources with identified psychosocial needs;Provide feedback about the scores to participant    Expected Outcomes  Short Term goal: Identification and review with participant of any Quality of Life or Depression concerns found by scoring the questionnaire.;Long Term goal: The participant improves quality of Life and PHQ9 Scores as seen by post scores and/or verbalization of changes       Quality of Life Scores: Quality of Life - 08/03/17 0937      Quality of Life Scores   Health/Function Pre  18 %    Socioeconomic Pre  28.8 %    Psych/Spiritual Pre  27.43 %    Family Pre  30 %    GLOBAL Pre  24.46 %      Scores of 19 and below usually indicate a poorer quality of life in these areas.  A difference of  2-3 points is a clinically meaningful difference.  A difference of 2-3 points in the total score of the Quality of Life Index has been associated with significant improvement in overall quality of life, self-image, physical symptoms, and general health in studies assessing change in quality of life.  PHQ-9: Recent Review Flowsheet Data    Depression screen [facility] 2/9 07/23/2015   Decreased Interest 0    Down, Depressed, Hopeless 0   PHQ - 2 Score 0     Interpretation of Total Score  Total Score Depression Severity:  1-4 = Minimal depression, 5-9 = Mild depression, 10-14 = Moderate depression, 15-19 = Moderately severe depression, 20-27 = Severe depression   Psychosocial Evaluation and Intervention:   Psychosocial Re-Evaluation:   Psychosocial Discharge (Final Psychosocial Re-Evaluation):   Vocational Rehabilitation: Provide vocational rehab assistance to qualifying candidates.   Vocational Rehab  Evaluation & Intervention: Vocational Rehab - 08/03/17 1058      Initial Vocational Rehab Evaluation & Intervention   Assessment shows need for Vocational Rehabilitation  No       Education: Education Goals: Education classes will be provided on a  weekly basis, covering required topics. Participant will state understanding/return demonstration of topics presented.  Learning Barriers/Preferences: Learning Barriers/Preferences - 08/03/17 0956      Learning Barriers/Preferences   Learning Barriers  Sight;Hearing    Learning Preferences  Skilled Demonstration       Education Topics: Count Your Pulse:  -Group instruction provided by verbal instruction, demonstration, patient participation and written materials to support subject.  Instructors address importance of being able to find your pulse and how to count your pulse when at home without a heart monitor.  Patients get hands on experience counting their pulse with staff help and individually.   Heart Attack, Angina, and Risk Factor Modification:  -Group instruction provided by verbal instruction, video, and written materials to support subject.  Instructors address signs and symptoms of angina and heart attacks.    Also discuss risk factors for heart disease and how to make changes to improve heart health risk factors.   Functional Fitness:  -Group instruction provided by verbal instruction, demonstration, patient participation, and written materials to support subject.  Instructors address safety measures for doing things around the house.  Discuss how to get up and down off the floor, how to pick things up properly, how to safely get out of a chair without assistance, and balance training.   Meditation and Mindfulness:  -Group instruction provided by verbal instruction, patient participation, and written materials to support subject.  Instructor addresses importance of mindfulness and meditation practice to help reduce stress and improve awareness.  Instructor also leads participants through a meditation exercise.    Stretching for Flexibility and Mobility:  -Group instruction provided by verbal instruction, patient participation, and written materials to support subject.   Instructors lead participants through series of stretches that are designed to increase flexibility thus improving mobility.  These stretches are additional exercise for major muscle groups that are typically performed during regular warm up and cool down.   Hands Only CPR:  -Group verbal, video, and participation provides a basic overview of AHA guidelines for community CPR. Role-play of emergencies allow participants the opportunity to practice calling for help and chest compression technique with discussion of AED use.   Hypertension: -Group verbal and written instruction that provides a basic overview of hypertension including the most recent diagnostic guidelines, risk factor reduction with self-care instructions and medication management.    Nutrition I class: Heart Healthy Eating:  -Group instruction provided by PowerPoint slides, verbal discussion, and written materials to support subject matter. The instructor gives an explanation and review of the Therapeutic Lifestyle Changes diet recommendations, which includes a discussion on lipid goals, dietary fat, sodium, fiber, plant stanol/sterol esters, sugar, and the components of a well-balanced, healthy diet.   Nutrition II class: Lifestyle Skills:  -Group instruction provided by PowerPoint slides, verbal discussion, and written materials to support subject matter. The instructor gives an explanation and review of label reading, grocery shopping for heart health, heart healthy recipe modifications, and ways to make healthier choices when eating out.   Diabetes Question & Answer:  -Group instruction provided by PowerPoint slides, verbal discussion, and written materials to support subject matter. The instructor gives an explanation and review of diabetes co-morbidities, pre- and post-prandial blood glucose goals, pre-exercise  blood glucose goals, signs, symptoms, and treatment of hypoglycemia and hyperglycemia, and foot care  basics.   Diabetes Blitz:  -Group instruction provided by PowerPoint slides, verbal discussion, and written materials to support subject matter. The instructor gives an explanation and review of the physiology behind type 1 and type 2 diabetes, diabetes medications and rational behind using different medications, pre- and post-prandial blood glucose recommendations and Hemoglobin A1c goals, diabetes diet, and exercise including blood glucose guidelines for exercising safely.    Portion Distortion:  -Group instruction provided by PowerPoint slides, verbal discussion, written materials, and food models to support subject matter. The instructor gives an explanation of serving size versus portion size, changes in portions sizes over the last 20 years, and what consists of a serving from each food group.   Stress Management:  -Group instruction provided by verbal instruction, video, and written materials to support subject matter.  Instructors review role of stress in heart disease and how to cope with stress positively.     Exercising on Your Own:  -Group instruction provided by verbal instruction, power point, and written materials to support subject.  Instructors discuss benefits of exercise, components of exercise, frequency and intensity of exercise, and end points for exercise.  Also discuss use of nitroglycerin and activating EMS.  Review options of places to exercise outside of rehab.  Review guidelines for sex with heart disease.   Cardiac Drugs I:  -Group instruction provided by verbal instruction and written materials to support subject.  Instructor reviews cardiac drug classes: antiplatelets, anticoagulants, beta blockers, and statins.  Instructor discusses reasons, side effects, and lifestyle considerations for each drug class.   Cardiac Drugs II:  -Group instruction provided by verbal instruction and written materials to support subject.  Instructor reviews cardiac drug classes:  angiotensin converting enzyme inhibitors (ACE-I), angiotensin II receptor blockers (ARBs), nitrates, and calcium channel blockers.  Instructor discusses reasons, side effects, and lifestyle considerations for each drug class.   Anatomy and Physiology of the Circulatory System:  Group verbal and written instruction and models provide basic cardiac anatomy and physiology, with the coronary electrical and arterial systems. Review of: AMI, Angina, Valve disease, Heart Failure, Peripheral Artery Disease, Cardiac Arrhythmia, Pacemakers, and the ICD.   Other Education:  -Group or individual verbal, written, or video instructions that support the educational goals of the cardiac rehab program.   Holiday Eating Survival Tips:  -Group instruction provided by PowerPoint slides, verbal discussion, and written materials to support subject matter. The instructor gives patients tips, tricks, and techniques to help them not only survive but enjoy the holidays despite the onslaught of food that accompanies the holidays.   Knowledge Questionnaire Score: Knowledge Questionnaire Score - 08/03/17 1610      Knowledge Questionnaire Score   Pre Score  21/28       Core Components/Risk Factors/Patient Goals at Admission: Personal Goals and Risk Factors at Admission - 08/03/17 1105      Core Components/Risk Factors/Patient Goals on Admission   Tobacco Cessation  Yes    Number of packs per day  1ppd    Intervention  Assist the participant in steps to quit. Provide individualized education and counseling about committing to Tobacco Cessation, relapse prevention, and pharmacological support that can be provided by physician.;Education officer, environmental, assist with locating and accessing local/national Quit Smoking programs, and support quit date choice.    Expected Outcomes  Short Term: Will demonstrate readiness to quit, by selecting a quit date.    Diabetes  Yes    Intervention  Provide education about  signs/symptoms and action to take for hypo/hyperglycemia.;Provide education about proper nutrition, including hydration, and aerobic/resistive exercise prescription along with prescribed medications to achieve blood glucose in normal ranges: Fasting glucose 65-99 mg/dL    Expected Outcomes  Short Term: Participant verbalizes understanding of the signs/symptoms and immediate care of hyper/hypoglycemia, proper foot care and importance of medication, aerobic/resistive exercise and nutrition plan for blood glucose control.;Long Term: Attainment of HbA1C < 7%.    Heart Failure  Yes    Intervention  Provide a combined exercise and nutrition program that is supplemented with education, support and counseling about heart failure. Directed toward relieving symptoms such as shortness of breath, decreased exercise tolerance, and extremity edema.    Expected Outcomes  Improve functional capacity of life    Lipids  Yes    Intervention  Provide education and support for participant on nutrition & aerobic/resistive exercise along with prescribed medications to achieve LDL 70mg , HDL >40mg .    Expected Outcomes  Short Term: Participant states understanding of desired cholesterol values and is compliant with medications prescribed. Participant is following exercise prescription and nutrition guidelines.;Long Term: Cholesterol controlled with medications as prescribed, with individualized exercise RX and with personalized nutrition plan. Value goals: LDL < 70mg , HDL > 40 mg.    Stress  Yes    Intervention  Offer individual and/or small group education and counseling on adjustment to heart disease, stress management and health-related lifestyle change. Teach and support self-help strategies.;Refer participants experiencing significant psychosocial distress to appropriate mental health specialists for further evaluation and treatment. When possible, include family members and significant others in education/counseling  sessions.    Expected Outcomes  Short Term: Participant demonstrates changes in health-related behavior, relaxation and other stress management skills, ability to obtain effective social support, and compliance with psychotropic medications if prescribed.;Long Term: Emotional wellbeing is indicated by absence of clinically significant psychosocial distress or social isolation.       Core Components/Risk Factors/Patient Goals Review:    Core Components/Risk Factors/Patient Goals at Discharge (Final Review):    ITP Comments: ITP Comments    Row Name 08/03/17 0903           ITP Comments  Dr. Armanda Magic, Medical Director           Comments: Patient attended orientation from [phone] to [phone] to review rules and guidelines for program. Completed 6 minute walk test, Intitial ITP, and exercise prescription.  VSS. Telemetry-afib, v paced,   Asymptomatic. Deveron Furlong, RN, BSN Cardiac Pulmonary Rehab 08/03/17  12:15 PM

## 2017-08-03 NOTE — Progress Notes (Addendum)
Pt in this morning accompanied by his daughter, [name] for cardiac rehab orientation.  Pt arrives in wheelchair wearing pajamas.  Pt is not wearing his oxygen.  Checked his O2 saturation on room air 86%. Pt complained of feeling tired and short of breath.  Daughter remarked that he had not worn his oxygen for two weeks.  Asked why, daughter said because he felt good and didn't need it.  Daughter also expressed concern because her father slept all day and would be up at night.  Pt placed immediately on oxygen therapy at 2lnc with immediate increase in oxygen saturation to 94-98. Vital sign bp 104/60, monitor shows afib paced.  Pt weighed and when I emptied his pocket he had a pack of cigarettes about 8 in the pack - Marlboro. Pt daughter stated that pt started back smoking and averages a pack a day. Assessed pt readiness to quit smoking.  Pt is not motivated to make any changes.  Pt recently resumed medications as he had stopped for a period of time.  Pt does not monitor his blood sugar or take his medication for diabetes.  I checked his blood sugar -255.  Pt completed 6 minute walk test on oxygen therapy at 3lnc( because of the increase in activity).  Pt traveled 600 feet with use of rolater and took one seated rest break.  Pt o2 sat on 3lnc at the completion of his walk test was 93% with dyspnea level of 1.  Pt completed all evaluations including a medical history intake.   Pt daughter expressed concern about the need for wearing oxygen continuous because the order with home health is for nighttime use.  Pt said it is difficult to cary the oxygen tank around so he leaves it at home.  Advised pt daughter I will send an in basket to Dr. [name], pt pulmonologist for a change in the oxygen order so pt would be able to have a portable tank in hopes this will help him with his compliancy. Will also notify Dr. Shirlee Latch, the referring provider pt return to smoking.  [name], BSN Cardiac and Advertising account planner

## 2017-08-03 NOTE — Progress Notes (Signed)
[name] 72 y.o. 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 AICD (automatic cardioverter/defibrillator) present   . Alcohol abuse   . Anxiety   . Bipolar disorder (HCC)   . Biventricular automatic implantable cardioverter defibrillator in situ    a. 01/2014 s/p MDT [id number] Viva XT CRT-D (ser # [id number]).  . CAD (coronary artery disease)    a. s/p prior PCI/stenting of the LCX;  b. 08/2014 MV: EF 20%, large septal, apical, and inferior infarct from apex to base, no ischemia;  c. 08/2014 NSTEMI/Cath: LM mod distal dzs extending into ostial LCX (80%), LAD tortuous, RI nl, OM mod dzs, RCA 100 CTO with R->R and L->R collats-->Med Rx.  . Chronic atrial fibrillation (HCC)    a. CHA2DS2VASc = 6->eliquis;  b. S/P AVN RFCA an BiV ICD placement.  . Chronic respiratory failure (HCC)   . Chronic systolic congestive heart failure (HCC)    a. 01/2014 Echo: EF 25-30%, mod conc LVH, mod dil LA.  . CKD (chronic kidney disease)    a. suspect stage II-III based on historical labs.  . Complete heart block (HCC)    a. In setting of prior AV nodal ablation r/t afib-->BiV ICD (01/2014).  . COPD (chronic obstructive pulmonary disease) (HCC)   . CVA (cerebral vascular accident) (HCC)    a. Multiple prior embolic strokes.  . Dementia   . Depression   . DVT (deep venous thrombosis) (HCC)   . Dyslipidemia   . Hyperglycemia   . Hypertension   . Mitral valve disease    a. remote mitral replacement with Bjork Shiley valve;  b. 06/2006 Redo MVR with tissue valve.  . Mixed Ischemic and Nonischemic Cardiomyopathy    a. 8/.2015 Echo: Ef 25-30%;  b. 01/2014 s/p MDT [id number] Talbot Grumbling XT CRT-D (ser # [id number]).  . On home oxygen therapy    "3L at night" (11/23/2015)  . Psoriasis    "back of his head; elbows; finger of left hand" (06/07/2017)  . Ventral hernia     Meds reviewed. Glipizide noted  HT: Ht Readings from Last 1 Encounters:  06/21/17 5\' 7"  (1.702 m)    WT: Wt Readings from Last 3 Encounters:  06/22/17 176 lb 3.2 oz (79.9 kg)  06/21/17 177 lb (80.3 kg)  06/13/17 169 lb 5 oz (76.8 kg)     BMI 27.7   Current tobacco use? Yes per discussion with Karlene Lineman, RN Labs:  Lipid Panel     Component Value Date/Time   CHOL 135 06/22/2017 1138   TRIG 187 (H) 06/22/2017 1138   HDL 39 (L) 06/22/2017 1138   CHOLHDL 3.5 06/22/2017 1138   VLDL 37 06/22/2017 1138   LDLCALC 59 06/22/2017 1138    Lab Results  Component Value Date   HGBA1C 7.8 (H) 02/12/2017   CBG (last 3)  Recent Labs    08/03/17 0852  GLUCAP 255*    Nutrition Note Spoke with pt and pt's daughter. Nutrition plan and goals reviewed with pt. Pt is not currently following the Therapeutic Lifestyle Changes diet. Per pt's daughter, "our diet has changed completely since he got home from the hospital." Pt is diabetic and has not taken his Glipizide x 3 weeks. Last A1c indicates blood glucose not optimally controlled. Pt checks CBG's 1 times a day. Fasting CBG's reportedly 115-120 mg/dL. Pt expressed  understanding of the information reviewed. Pt aware of nutrition education classes offered and plans on attending nutrition classes.  Nutrition Diagnosis ? Food-and nutrition-related knowledge deficit related to lack of exposure to information as related to diagnosis of: ? CVD ? DM  Nutrition Intervention ? Pt's individual nutrition plan and goals reviewed with pt.  Nutrition Goal(s):  ? Pt to identify and limit food sources of saturated fat, trans fat, and sodium ? Improved blood glucose control as evidenced by pt's A1c trending from 7.8 toward less than 7.5  Plan:  Pt to attend nutrition classes ? Nutrition I ? Nutrition II ? Portion Distortion ? Diabetes Blitz ? Diabetes Q & A Will provide client-centered nutrition education as part of interdisciplinary care.    Monitor and evaluate progress toward nutrition goal with team.  Mickle Plumb, M.Ed, RD, LDN, CDE 08/03/2017 8:59 AM

## 2017-08-03 NOTE — Telephone Encounter (Signed)
-----   Message from [name], MD sent at 08/02/2017 11:00 PM EST ----- Regarding: RE: Rip Harbour to use oxygen therapy at cardiac rehab He has had left main stenosis for years now. Ok to participate. May use O2.  Ok for O2 sat >/= 90%.  ----- Message ----- From: Chelsea Aus, RN Sent: 08/02/2017   3:25 PM To: [name], MD Subject: Ok to use oxygen therapy at cardiac rehab      Dr. Shirlee Latch,  The above patient referred to cardiac rehab.  Pt has left main disease with 90% stenosis distally.  This is a relative contraindication for participation in cardiac rehab.  Relative contraindications can be superseded if benefits outweigh the risks of exercise.  In some instances, these patients can be exercised with caution using low level endpoints, especially if asymptomatic at rest.  Do you feel this pt is stable to participate in cardiac rehab?  Pt noted in his history to wear oxygen for severe COPD.  Pt uses 2lnc at rest.  May pt use oxygen therapy here at cardiac rehab with titration to maintain o2 sat?   What is an acceptable o2 sat for this pt?  Thanks so much for your input!! [name], BSN Cardiac and Pulmonary Rehab Nurse Navigator

## 2017-08-03 NOTE — Progress Notes (Signed)
Cardiac Rehab Medication Review   Does the patient  feel that his/her medications are working for him/her?  YES when he takes them  Has the patient been experiencing any side effects to the medications prescribed?  NO  Does the patient measure his/her own blood pressure or blood glucose at home?   NO   Pt has blood glucose meter but does not check his blood glucose  Does the patient have any problems obtaining medications due to transportation or finances?    NO per daughter  Understanding of regimen: Poor due to non compliance Understanding of indications: Poor, daughter has some knowledge of medications Potential of compliance: Poor        [name] Dominic Chestnut RN 08/03/2017 11:47 AM

## 2017-08-09 ENCOUNTER — Ambulatory Visit (HOSPITAL_COMMUNITY): Payer: Medicare Other

## 2017-08-09 ENCOUNTER — Encounter (HOSPITAL_COMMUNITY): Payer: Self-pay

## 2017-08-09 ENCOUNTER — Telehealth (HOSPITAL_COMMUNITY): Payer: Self-pay | Admitting: Internal Medicine

## 2017-08-11 ENCOUNTER — Ambulatory Visit (HOSPITAL_COMMUNITY): Payer: Medicare Other

## 2017-08-11 ENCOUNTER — Telehealth (HOSPITAL_COMMUNITY): Payer: Self-pay | Admitting: Internal Medicine

## 2017-08-13 ENCOUNTER — Ambulatory Visit (HOSPITAL_COMMUNITY): Payer: Medicare Other

## 2017-08-16 ENCOUNTER — Ambulatory Visit (HOSPITAL_COMMUNITY): Payer: Medicare Other

## 2017-08-18 ENCOUNTER — Inpatient Hospital Stay (HOSPITAL_COMMUNITY)
Admission: RE | Admit: 2017-08-18 | Discharge: 2017-08-18 | Disposition: A | Discharge status: Home / Self Care | Payer: Medicare Other | Source: Ambulatory Visit

## 2017-08-18 ENCOUNTER — Ambulatory Visit (HOSPITAL_COMMUNITY): Payer: Medicare Other

## 2017-08-18 DIAGNOSIS — I5022 Chronic systolic (congestive) heart failure: Secondary | ICD-10-CM

## 2017-08-20 ENCOUNTER — Ambulatory Visit (HOSPITAL_COMMUNITY): Payer: Medicare Other

## 2017-08-22 ENCOUNTER — Emergency Department (HOSPITAL_COMMUNITY)
Admission: EM | Admit: 2017-08-22 | Discharge: 2017-08-23 | Disposition: A | Discharge status: Home / Self Care | Payer: Medicare Other | Attending: Emergency Medicine | Admitting: Emergency Medicine

## 2017-08-22 ENCOUNTER — Encounter (HOSPITAL_COMMUNITY): Payer: Self-pay | Admitting: Nurse Practitioner

## 2017-08-22 ENCOUNTER — Emergency Department (HOSPITAL_COMMUNITY): Payer: Medicare Other

## 2017-08-22 DIAGNOSIS — I2510 Atherosclerotic heart disease of native coronary artery without angina pectoris: Secondary | ICD-10-CM | POA: Insufficient documentation

## 2017-08-22 DIAGNOSIS — I5022 Chronic systolic (congestive) heart failure: Secondary | ICD-10-CM | POA: Insufficient documentation

## 2017-08-22 DIAGNOSIS — J449 Chronic obstructive pulmonary disease, unspecified: Secondary | ICD-10-CM | POA: Diagnosis not present

## 2017-08-22 DIAGNOSIS — I129 Hypertensive chronic kidney disease with stage 1 through stage 4 chronic kidney disease, or unspecified chronic kidney disease: Secondary | ICD-10-CM | POA: Diagnosis not present

## 2017-08-22 DIAGNOSIS — J811 Chronic pulmonary edema: Secondary | ICD-10-CM | POA: Diagnosis not present

## 2017-08-22 DIAGNOSIS — R1030 Lower abdominal pain, unspecified: Secondary | ICD-10-CM

## 2017-08-22 DIAGNOSIS — Z79899 Other long term (current) drug therapy: Secondary | ICD-10-CM | POA: Insufficient documentation

## 2017-08-22 DIAGNOSIS — Z7901 Long term (current) use of anticoagulants: Secondary | ICD-10-CM | POA: Diagnosis not present

## 2017-08-22 DIAGNOSIS — Z95810 Presence of automatic (implantable) cardiac defibrillator: Secondary | ICD-10-CM | POA: Insufficient documentation

## 2017-08-22 DIAGNOSIS — I4891 Unspecified atrial fibrillation: Secondary | ICD-10-CM | POA: Insufficient documentation

## 2017-08-22 DIAGNOSIS — Z87891 Personal history of nicotine dependence: Secondary | ICD-10-CM | POA: Insufficient documentation

## 2017-08-22 DIAGNOSIS — Z86718 Personal history of other venous thrombosis and embolism: Secondary | ICD-10-CM | POA: Insufficient documentation

## 2017-08-22 DIAGNOSIS — N183 Chronic kidney disease, stage 3 (moderate): Secondary | ICD-10-CM | POA: Insufficient documentation

## 2017-08-22 DIAGNOSIS — F319 Bipolar disorder, unspecified: Secondary | ICD-10-CM | POA: Insufficient documentation

## 2017-08-22 DIAGNOSIS — I130 Hypertensive heart and chronic kidney disease with heart failure and stage 1 through stage 4 chronic kidney disease, or unspecified chronic kidney disease: Secondary | ICD-10-CM | POA: Insufficient documentation

## 2017-08-22 LAB — CBC WITH DIFFERENTIAL/PLATELET
BASOS PCT: 1 %
Basophils Absolute: 0.1 10*3/uL (ref 0.0–0.1)
EOS ABS: 0.4 10*3/uL (ref 0.0–0.7)
EOS PCT: 4 %
HCT: 38.5 % — ABNORMAL LOW (ref 39.0–52.0)
Hemoglobin: 11.2 g/dL — ABNORMAL LOW (ref 13.0–17.0)
LYMPHS ABS: 2 10*3/uL (ref 0.7–4.0)
Lymphocytes Relative: 16 %
MCH: 27.6 pg (ref 26.0–34.0)
MCHC: 29.1 g/dL — AB (ref 30.0–36.0)
MCV: 94.8 fL (ref 78.0–100.0)
Monocytes Absolute: 0.9 10*3/uL (ref 0.1–1.0)
Monocytes Relative: 7 %
Neutro Abs: 8.8 10*3/uL — ABNORMAL HIGH (ref 1.7–7.7)
Neutrophils Relative %: 72 %
PLATELETS: 250 10*3/uL (ref 150–400)
RBC: 4.06 MIL/uL — ABNORMAL LOW (ref 4.22–5.81)
RDW: 15.1 % (ref 11.5–15.5)
WBC: 12.2 10*3/uL — AB (ref 4.0–10.5)

## 2017-08-22 LAB — URINALYSIS, ROUTINE W REFLEX MICROSCOPIC
BILIRUBIN URINE: NEGATIVE
Glucose, UA: NEGATIVE mg/dL
HGB URINE DIPSTICK: NEGATIVE
Ketones, ur: NEGATIVE mg/dL
Leukocytes, UA: NEGATIVE
Nitrite: NEGATIVE
PROTEIN: NEGATIVE mg/dL
Specific Gravity, Urine: 1.01 (ref 1.005–1.030)
pH: 5 (ref 5.0–8.0)

## 2017-08-22 NOTE — ED Notes (Signed)
EKG given to EDP,Wickline,MD., for review. 

## 2017-08-22 NOTE — ED Triage Notes (Signed)
Pt is presents with c/o abdominal pain, family at bedside is concerned that "pt may have fluid build in his stomach" Last BM 2 days ago. Denies hx of liver disease.

## 2017-08-22 NOTE — ED Notes (Signed)
Bed: [facility]79 Expected date:  Expected time:  Means of arrival:  Comments: 72 m abd pain sob

## 2017-08-22 NOTE — ED Provider Notes (Signed)
[facility]-EMERGENCY DEPT Provider Note   CSN: [id number] Arrival date & time: 08/22/17  2215     History   Chief Complaint Chief Complaint  Patient presents with  . Abdominal Pain    HPI [name] is a 72 y.o. male.  The history is provided by the patient and a relative.  Abdominal Pain   This is a new problem. The current episode started 6 to 12 hours ago. The problem occurs constantly. The problem has not changed since onset.The pain is located in the generalized abdominal region. The pain is moderate. Associated symptoms include constipation. Pertinent negatives include fever, diarrhea and vomiting. Nothing aggravates the symptoms. Nothing relieves the symptoms.  Patient with history of CAD/CHF/COPD presents with lower abdominal pain for the past 12 hours.  He reports constipation for up to 2 days.  No vomiting. No new chest pain or shortness of breath.  No fever/ vomiting He lives with daughter, who reports he is performing very little activity on a daily basis. He was supposed to be doing cardiac rehab but refused to get out of bed.   Past Medical History:  Diagnosis Date  . AICD (automatic cardioverter/defibrillator) present   . Alcohol abuse   . Anxiety   . Bipolar disorder (HCC)   . Biventricular automatic implantable cardioverter defibrillator in situ    a. 01/2014 s/p MDT [id number] Viva XT CRT-D (ser # [id number]).  . CAD (coronary artery disease)    a. s/p prior PCI/stenting of the LCX;  b. 08/2014 MV: EF 20%, large septal, apical, and inferior infarct from apex to base, no ischemia;  c. 08/2014 NSTEMI/Cath: LM mod distal dzs extending into ostial LCX (80%), LAD tortuous, RI nl, OM mod dzs, RCA 100 CTO with R->R and L->R collats-->Med Rx.  . Chronic atrial fibrillation (HCC)    a. CHA2DS2VASc = 6->eliquis;  b. S/P AVN RFCA an BiV ICD placement.  . Chronic respiratory failure (HCC)   . Chronic systolic congestive heart failure (HCC)    a. 01/2014  Echo: EF 25-30%, mod conc LVH, mod dil LA.  . CKD (chronic kidney disease)    a. suspect stage II-III based on historical labs.  . Complete heart block (HCC)    a. In setting of prior AV nodal ablation r/t afib-->BiV ICD (01/2014).  . COPD (chronic obstructive pulmonary disease) (HCC)   . CVA (cerebral vascular accident) (HCC)    a. Multiple prior embolic strokes.  . Dementia   . Depression   . DVT (deep venous thrombosis) (HCC)   . Dyslipidemia   . Hyperglycemia   . Hypertension   . Mitral valve disease    a. remote mitral replacement with Bjork Shiley valve;  b. 06/2006 Redo MVR with tissue valve.  . Mixed Ischemic and Nonischemic Cardiomyopathy    a. 8/.2015 Echo: Ef 25-30%;  b. 01/2014 s/p MDT [id number] Talbot Grumbling XT CRT-D (ser # [id number]).  . On home oxygen therapy    "3L at night" (11/23/2015)  . Psoriasis    "back of his head; elbows; finger of left hand" (06/07/2017)  . Ventral hernia     Patient Active Problem List   Diagnosis Date Noted  . Chronic respiratory failure with hypoxia and hypercapnia (HCC) 06/22/2017  . CHF exacerbation (HCC) 06/06/2017  . Acute respiratory failure with hypoxia and hypercapnia (HCC)   . COPD GOLD 0 still smoking  03/02/2016  . Chronic kidney disease (CKD), stage II (mild) 11/23/2015  . CAD (coronary artery disease)   .  Dementia   . Chronic respiratory failure with hypoxia (HCC) 10/11/2015  . Chronic systolic CHF (congestive heart failure) (HCC) 05/30/2015  . Cigarette smoker 12/24/2014  . Stroke (HCC)   . Mitral valve disease   . Depression   . Mixed Ischemic and Nonischemic Cardiomyopathy   . Alcohol abuse   . Chronic anticoagulation 08/22/2014  . PVD (peripheral vascular disease) (HCC) 02/28/2014  . Mural thrombus of heart 02/07/2014  . Hypertension 06/14/2012  . Automatic implantable cardioverter-defibrillator in situ 04/04/2012  . Hyperlipidemia 10/27/2011  . Ventricular tachycardia (HCC) 07/14/2011  . Atrial fibrillation, chronic (HCC)  07/13/2011  . Right atrial thrombus (HCC) 07/11/2011    Past Surgical History:  Procedure Laterality Date  . AV NODE ABLATION  2007  . CARDIAC CATHETERIZATION  06/2006   Mild ostial L main stenosis, CFX stent patent, RCA occluded (old)  . CORONARY ARTERY BYPASS GRAFT  2008  . CYSTOSCOPY W/ URETERAL STENT PLACEMENT  07/12/2011   Procedure: CYSTOSCOPY WITH RETROGRADE PYELOGRAM/URETERAL STENT PLACEMENT;  Surgeon: Sebastian Ache, MD;  Location: [facility] OR;  Service: Urology;  Laterality: Left;  . HERNIA REPAIR    . ICD GENERATOR CHANGE  02/01/2014   Gen change to: Medtronic VIVA pulse generator, serial number [id number]  . INSERT / REPLACE / REMOVE PACEMAKER    . LEFT HEART CATHETERIZATION WITH CORONARY ANGIOGRAM N/A 08/23/2014   Procedure: LEFT HEART CATHETERIZATION WITH CORONARY ANGIOGRAM;  Surgeon: Corky Crafts, MD;  Location: [facility] CATH LAB;  Service: Cardiovascular;  Laterality: N/A;  . MITRAL VALVE REPLACEMENT     remote [facility] valve 7001 with redo tissue valve 06/2006  . PACEMAKER GENERATOR CHANGE N/A 02/01/2014   Procedure: PACEMAKER GENERATOR CHANGE;  Surgeon: Duke Salvia, MD;  Location: [facility] CATH LAB;  Service: Cardiovascular;  Laterality: N/A;  . PACEMAKER PLACEMENT  2006   Changed to CRT-D in 2009  . RIGHT/LEFT HEART CATH AND CORONARY ANGIOGRAPHY N/A 06/09/2017   Procedure: RIGHT/LEFT HEART CATH AND CORONARY ANGIOGRAPHY;  Surgeon: [name], MD;  Location: [facility] INVASIVE CV LAB;  Service: Cardiovascular;  Laterality: N/A;       Home Medications    Prior to Admission medications   Medication Sig Start Date End Date Taking? Authorizing Provider  apixaban (ELIQUIS) 5 MG TABS tablet Take 1 tablet (5 mg total) by mouth 2 (two) times daily. 02/12/17  Yes Little Ishikawa, NP  atorvastatin (LIPITOR) 40 MG tablet Take 1 tablet (40 mg total) by mouth daily. 05/13/16  Yes Clegg, Amy D, NP  carvedilol (COREG) 6.25 MG tablet Take 1 tablet (6.25 mg total) by mouth 2 (two) times daily  with a meal. 06/13/17  Yes Rai, Ripudeep K, MD  glipiZIDE (GLUCOTROL) 5 MG tablet Take 5 mg daily before breakfast by mouth.   Yes [provider]  guaiFENesin-dextromethorphan (ROBITUSSIN DM) 100-10 MG/5ML syrup Take 5 mLs by mouth every 4 (four) hours as needed for cough. 06/13/17  Yes Rai, Ripudeep K, MD  losartan (COZAAR) 25 MG tablet Take 1 tablet (25 mg total) by mouth daily. 06/13/17  Yes Rai, Ripudeep K, MD  Multiple Vitamin (MULTIVITAMIN WITH MINERALS) TABS tablet Take 1 tablet by mouth daily. 05/24/15  Yes Osvaldo Shipper, MD  nitroGLYCERIN (NITROSTAT) 0.4 MG SL tablet Place 1 tablet (0.4 mg total) under the tongue every 5 (five) minutes as needed for chest pain. 01/06/15  Yes Simmons, Brittainy M, PA-C  OXYGEN Inhale 2.5 L into the lungs.   Yes [provider]  ranitidine (ZANTAC) 150  MG tablet Take 150 mg by mouth daily as needed for heartburn.   Yes [provider]  spironolactone (ALDACTONE) 25 MG tablet Take 0.5 tablets (12.5 mg total) by mouth daily. 01/29/17  Yes [name], MD  thiamine 100 MG tablet Take 1 tablet (100 mg total) by mouth daily. 05/24/15  Yes Osvaldo Shipper, MD  torsemide (DEMADEX) 20 MG tablet Take 3 tablets (60 mg total) by mouth daily. Take 60mg  (3tabs) daily. Take extra 20mg  (1 tab) in afternoon if weight up more than 20lbs. 06/14/17  Yes Rai, Ripudeep K, MD  loratadine (CLARITIN) 10 MG tablet Take 1 tablet (10 mg total) by mouth daily. Patient not taking: Reported on 08/22/2017 06/14/17   Cathren Harsh, MD    Family History Family History  Problem Relation Age of Onset  . Alzheimer's disease Mother   . Heart attack Father   . Heart disease Father   . Heart attack Son   . Varicose Veins Son   . Deep vein thrombosis Son   . Heart disease Son   . Stroke Unknown   . Heart disease Unknown   . Heart disease Brother     Social History Social History   Tobacco Use  . Smoking status: Former Smoker    Packs/day: 2.00    Years:  42.00    Pack years: 84.00    Types: Cigarettes  . Smokeless tobacco: Never Used  Substance Use Topics  . Alcohol use: Yes    Alcohol/week: 0.0 oz    Comment: 11/22/2015 "6 pack of beer/month"  . Drug use: No     Allergies   Warfarin   Review of Systems Review of Systems  Constitutional: Negative for fever.  Cardiovascular: Negative for chest pain.  Gastrointestinal: Positive for abdominal pain and constipation. Negative for diarrhea and vomiting.  All other systems reviewed and are negative.    Physical Exam Updated Vital Signs BP 140/79 (BP Location: Left Arm)   Pulse 60   Temp (!) 97.3 F (36.3 C) (Oral)   Resp (!) 33   SpO2 96%   Physical Exam CONSTITUTIONAL: Elderly and chronically ill-appearing HEAD: Normocephalic/atraumatic EYES: EOMI/PERRL ENMT: Mucous membranes moist NECK: supple no meningeal signs SPINE/BACK:entire spine nontender CV: S1/S2 noted LUNGS: Mild tachypnea, wheezing bilaterally ABDOMEN: soft, nontender, no rebound or guarding, bowel sounds noted throughout abdomen Obese.  Easily reducible midline hernia.  No focal abdominal tenderness GU:no cva tenderness, no scrotal tenderness, testicles descended bilaterally and no tenderness, no inguinal hernia noted, chaperone present for exam Rectal- no masses, prostate enlarged, no melena or blood, no stool impaction NEURO: Pt is awake/alert/appropriate, moves all extremitiesx4.  No facial droop.   EXTREMITIES: pulses normal/equal, full ROM SKIN: warm, color normal PSYCH: no abnormalities of mood noted, alert and oriented to situation   ED Treatments / Results  Labs (all labs ordered are listed, but only abnormal results are displayed) Labs Reviewed  COMPREHENSIVE METABOLIC PANEL - Abnormal; Notable for the following components:      Result Value   Chloride 97 (*)    CO2 33 (*)    Glucose, Bld 143 (*)    BUN 38 (*)    Creatinine, Ser 1.64 (*)    Total Protein 8.3 (*)    GFR calc non Af Amer  41 (*)    GFR calc Af Amer 47 (*)    All other components within normal limits  CBC WITH DIFFERENTIAL/PLATELET - Abnormal; Notable for the following components:   WBC 12.2 (*)  RBC 4.06 (*)    Hemoglobin 11.2 (*)    HCT 38.5 (*)    MCHC 29.1 (*)    Neutro Abs 8.8 (*)    All other components within normal limits  LIPASE, BLOOD  URINALYSIS, ROUTINE W REFLEX MICROSCOPIC  ETHANOL    EKG  EKG Interpretation  Date/Time:  Sunday August 22 2017 23:24:02 EDT Ventricular Rate:  60 PR Interval:    QRS Duration: 163 QT Interval:  431 QTC Calculation: 431 R Axis:   -91 Text Interpretation:  Ventricular-paced rhythm No further analysis attempted due to paced rhythm No significant change since last tracing Confirmed by Zadie Rhine ([id number]) on 08/22/2017 11:41:33 PM       Radiology Ct Abdomen Pelvis W Contrast  Result Date: 08/23/2017 CLINICAL DATA:  Abdominal pain and distention. No bowel movement for 2 days. Leukocytosis. EXAM: CT ABDOMEN AND PELVIS WITH CONTRAST TECHNIQUE: Multidetector CT imaging of the abdomen and pelvis was performed using the standard protocol following bolus administration of intravenous contrast. CONTRAST:  80mL ISOVUE-300 IOPAMIDOL (ISOVUE-300) INJECTION 61% COMPARISON:  [facility] dating back through 08/18/2011 FINDINGS: Lower chest: Cardiomegaly with post CABG change. Coronary arteriosclerosis of the native coronary arteries. Pacer in ICD leads in the right ventricle. Soft tissue partially calcified density within the lumen of the left atrium measuring 3.5 x 2.2 x 4.8 cm, chronic in appearance and dating back to previous comparison studies to 2013. 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 This is a chronic finding and unchanged appearance. Mitral valvular replacement is noted. Vascular congestion is noted of the included lung bases. Hepatobiliary: Uncomplicated cholelithiasis with a 6 mm gallstone seen along the  dependent wall. Homogeneous appearance of the liver without mass. Pancreas: Normal Spleen: Normal Adrenals/Urinary Tract: Normal bilateral adrenal glands. Cortical thinning and scarring bilaterally of the kidneys without obstructive uropathy, nephrolithiasis or enhancing renal mass. The urinary bladder is non distended which may account for the mild mural thickening noted. No calculus is seen. Stomach/Bowel: A small hiatal hernia is noted. The stomach is decompressed in appearance. There is normal small bowel rotation without small bowel dilatation or obstruction. An average amount of fecal retention is seen within the colon with scattered colonic diverticulosis noted along the ascending colon and cecum. No evidence of acute diverticulitis. An infraumbilical ventral Richter's type hernia involving omental fat and a short segment of the anti mesenteric wall of large bowel is noted without incarceration. Vascular/Lymphatic: Mild to moderate aortoiliac and branch vessel atherosclerosis. No aneurysm. No lymphadenopathy. Reproductive: Normal seminal vesicles and prostate. Other: Small fat containing inguinal hernias. No ascites or free air. Musculoskeletal: Chronic Schmorl's node off the superior endplate of L4 with degenerative disc disease L4-5 L5-S1. No acute nor suspicious osseous abnormalities. IMPRESSION: 1. Stable cardiomegaly with post CABG and mitral valvular replacement. 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 This is unchanged dating back to 2013. 2. Vascular congestion noted at the lung bases. 3. 6 mm gallstone without complication. 4. Infraumbilical ventral Richter type hernia containing a short segment of large bowel without incarceration. No bowel obstruction or inflammation. 5. Mild to moderate aortoiliac and branch vessel atherosclerosis without aneurysm. 6. Small fat containing inguinal hernias  bilaterally Electronically Signed   By: Tollie Eth M.D.   On: 08/23/2017 02:54   Dg Abd Acute W/chest  Result Date: 08/23/2017 CLINICAL DATA:  Abdominal pain. EXAM: DG ABDOMEN ACUTE W/ 1V CHEST COMPARISON:  Chest radiograph 12/19/2017 FINDINGS: Post median sternotomy with prosthetic cardiac valve. Left-sided pacemaker in place. Mild cardiomegaly is unchanged. Increased interstitial opacities. Left basilar scarring. Gaseous distention of bowel in the central and left abdomen may be small bowel or colonic, with scattered air-fluid levels. No evidence of free air. Moderate stool in the right colon. Round 7 mm calcification in the right mid abdomen, with an additional calcification projecting over the L1-L2 disc space. Remote right rib fractures. No acute osseous abnormalities are seen. IMPRESSION: 1. Gaseous distention of bowel loops in the central and left abdomen which may be small bowel or colon with air-fluid levels. This may be enteritis, ileus, or developing bowel obstruction. Recommend CT for further evaluation. 2. Round 7mm calcification in the right mid abdomen may be a gallstone, renal stone, or subcutaneous granuloma. 3. Mild cardiomegaly. Increased interstitial opacities in the lungs suspicious for pulmonary edema. Electronically Signed   By: Rubye Oaks M.D.   On: 08/23/2017 00:27    Procedures Procedures (including critical care time)  Medications Ordered in ED Medications  furosemide (LASIX) injection 40 mg (40 mg Intravenous Given 08/23/17 0149)  iopamidol (ISOVUE-300) 61 % injection 100 mL (80 mLs Intravenous Contrast Given 08/23/17 0159)     Initial Impression / Assessment and Plan / ED Course  I have reviewed the triage vital signs and the nursing notes.  Pertinent labs & imaging results that were available during my care of the patient were reviewed by me and considered in my medical decision making (see chart for details).     11:47 PM Patient stable this time, no focal  abdominal tenderness.  Watching television.  Will obtain labs and acute abdominal series. After discussion with daughter, I feel he would benefit from home health care 1:20 AM X-ray results noted.  We will proceed with CT imaging 3:18 AM Imaging reveals mostly chronic findings. Patient resting comfortably, sleeping at this time.  No new complaints.  He was given a dose of Lasix due to pulmonary edema.  He has no new oxygen requirement Daughter.  Will take him home.  Home health has been ordered for patient   Final Clinical Impressions(s) / ED Diagnoses   Final diagnoses:  Lower abdominal pain  Chronic pulmonary edema    ED Discharge Orders    None       Zadie Rhine, MD 08/23/17 [phone]

## 2017-08-23 ENCOUNTER — Encounter (HOSPITAL_COMMUNITY): Payer: Self-pay

## 2017-08-23 ENCOUNTER — Emergency Department (HOSPITAL_COMMUNITY): Payer: Medicare Other

## 2017-08-23 ENCOUNTER — Ambulatory Visit (HOSPITAL_COMMUNITY): Payer: Medicare Other

## 2017-08-23 ENCOUNTER — Telehealth (HOSPITAL_COMMUNITY): Payer: Self-pay | Admitting: *Deleted

## 2017-08-23 ENCOUNTER — Telehealth: Payer: Self-pay | Admitting: Emergency Medicine

## 2017-08-23 LAB — COMPREHENSIVE METABOLIC PANEL
ALBUMIN: 3.7 g/dL (ref 3.5–5.0)
ALT: 21 U/L (ref 17–63)
AST: 29 U/L (ref 15–41)
Alkaline Phosphatase: 101 U/L (ref 38–126)
Anion gap: 9 (ref 5–15)
BUN: 38 mg/dL — ABNORMAL HIGH (ref 6–20)
CHLORIDE: 97 mmol/L — AB (ref 101–111)
CO2: 33 mmol/L — ABNORMAL HIGH (ref 22–32)
Calcium: 9.1 mg/dL (ref 8.9–10.3)
Creatinine, Ser: 1.64 mg/dL — ABNORMAL HIGH (ref 0.61–1.24)
GFR calc Af Amer: 47 mL/min — ABNORMAL LOW (ref >60)
GFR calc non Af Amer: 41 mL/min — ABNORMAL LOW (ref >60)
GLUCOSE: 143 mg/dL — AB (ref 65–99)
POTASSIUM: 4 mmol/L (ref 3.5–5.1)
SODIUM: 139 mmol/L (ref 135–145)
Total Bilirubin: 0.5 mg/dL (ref 0.3–1.2)
Total Protein: 8.3 g/dL — ABNORMAL HIGH (ref 6.5–8.1)

## 2017-08-23 LAB — LIPASE, BLOOD: Lipase: 40 U/L (ref 11–51)

## 2017-08-23 LAB — ETHANOL: Alcohol, Ethyl (B): <10 mg/dL (ref <10)

## 2017-08-23 MED ORDER — IOPAMIDOL (ISOVUE-300) INJECTION 61%
100.0000 mL | Freq: Once | INTRAVENOUS | Status: AC | PRN
  Administered 2017-08-23: 80 mL via INTRAVENOUS

## 2017-08-23 MED ORDER — IOPAMIDOL (ISOVUE-300) INJECTION 61%
INTRAVENOUS | Status: AC
  Filled 2017-08-23: qty 100

## 2017-08-23 MED ORDER — FUROSEMIDE 10 MG/ML IJ SOLN
40.0000 mg | Freq: Once | INTRAMUSCULAR | Status: AC
  Administered 2017-08-23: 40 mg via INTRAVENOUS
  Filled 2017-08-23: qty 4

## 2017-08-23 NOTE — Telephone Encounter (Signed)
CM consulted for [facility] services.  CM noted that pt has cardiac rehab outpt services at this time and cannot have both.  Attempted to call pt's daughter per notes request to update.  Left VM for return call.

## 2017-08-23 NOTE — ED Notes (Signed)
Bed: SW10 Expected date:  Expected time:  Means of arrival:  Comments: Room 24

## 2017-08-23 NOTE — Discharge Instructions (Signed)

## 2017-08-24 NOTE — Telephone Encounter (Signed)
No return call from family at this time.  Cardiac rehab appointments remain scheduled from chart review.  CM will speak with family if the return call.  No further CM needs noted at this time.

## 2017-08-25 ENCOUNTER — Ambulatory Visit (HOSPITAL_COMMUNITY): Payer: Medicare Other

## 2017-08-27 ENCOUNTER — Ambulatory Visit (HOSPITAL_COMMUNITY): Payer: Medicare Other

## 2017-08-27 ENCOUNTER — Telehealth (HOSPITAL_COMMUNITY): Payer: Self-pay | Admitting: *Deleted

## 2017-08-30 ENCOUNTER — Ambulatory Visit (HOSPITAL_COMMUNITY): Payer: Medicare Other

## 2017-09-01 ENCOUNTER — Ambulatory Visit (HOSPITAL_COMMUNITY): Payer: Medicare Other

## 2017-09-02 ENCOUNTER — Encounter (HOSPITAL_COMMUNITY): Payer: Medicare Other | Admitting: Cardiology

## 2017-09-03 ENCOUNTER — Ambulatory Visit (HOSPITAL_COMMUNITY): Payer: Medicare Other

## 2017-09-06 ENCOUNTER — Ambulatory Visit (HOSPITAL_COMMUNITY): Payer: Medicare Other

## 2017-09-08 ENCOUNTER — Ambulatory Visit (HOSPITAL_COMMUNITY): Payer: Medicare Other

## 2017-09-09 ENCOUNTER — Other Ambulatory Visit (HOSPITAL_COMMUNITY): Payer: Self-pay | Admitting: Cardiology

## 2017-09-10 ENCOUNTER — Ambulatory Visit (HOSPITAL_COMMUNITY): Payer: Medicare Other

## 2017-09-13 ENCOUNTER — Ambulatory Visit (HOSPITAL_COMMUNITY): Payer: Medicare Other

## 2017-09-15 ENCOUNTER — Ambulatory Visit (HOSPITAL_COMMUNITY): Payer: Medicare Other

## 2017-09-15 IMAGING — CR DG CHEST 2V
2 series · 2 of 2 positions shown · non-contrast
Comparison: 03/09/2016

CLINICAL DATA: Cough, congestion, shortness of Breath

EXAM:
CHEST  2 VIEW

[w chest pa]
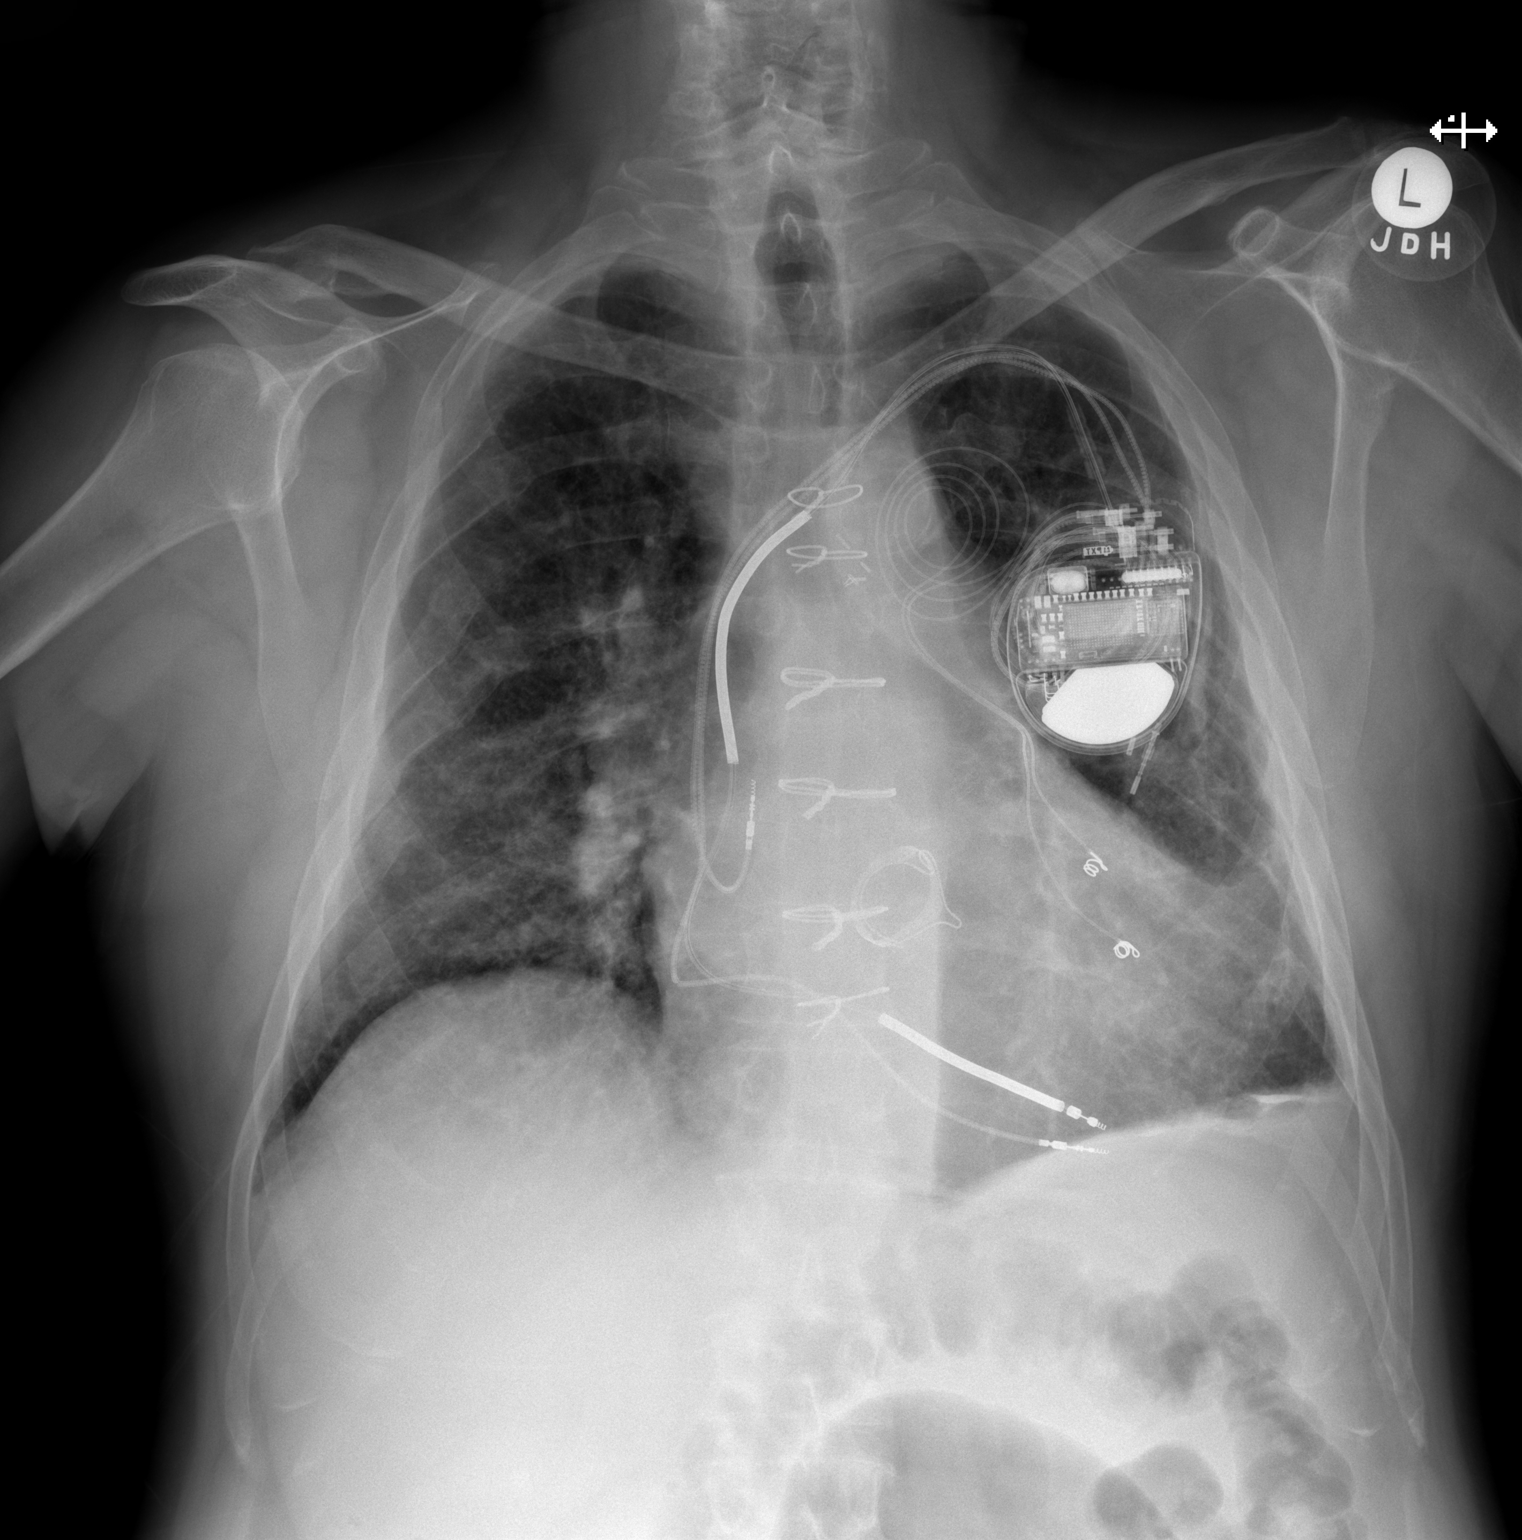

[w chest lat]
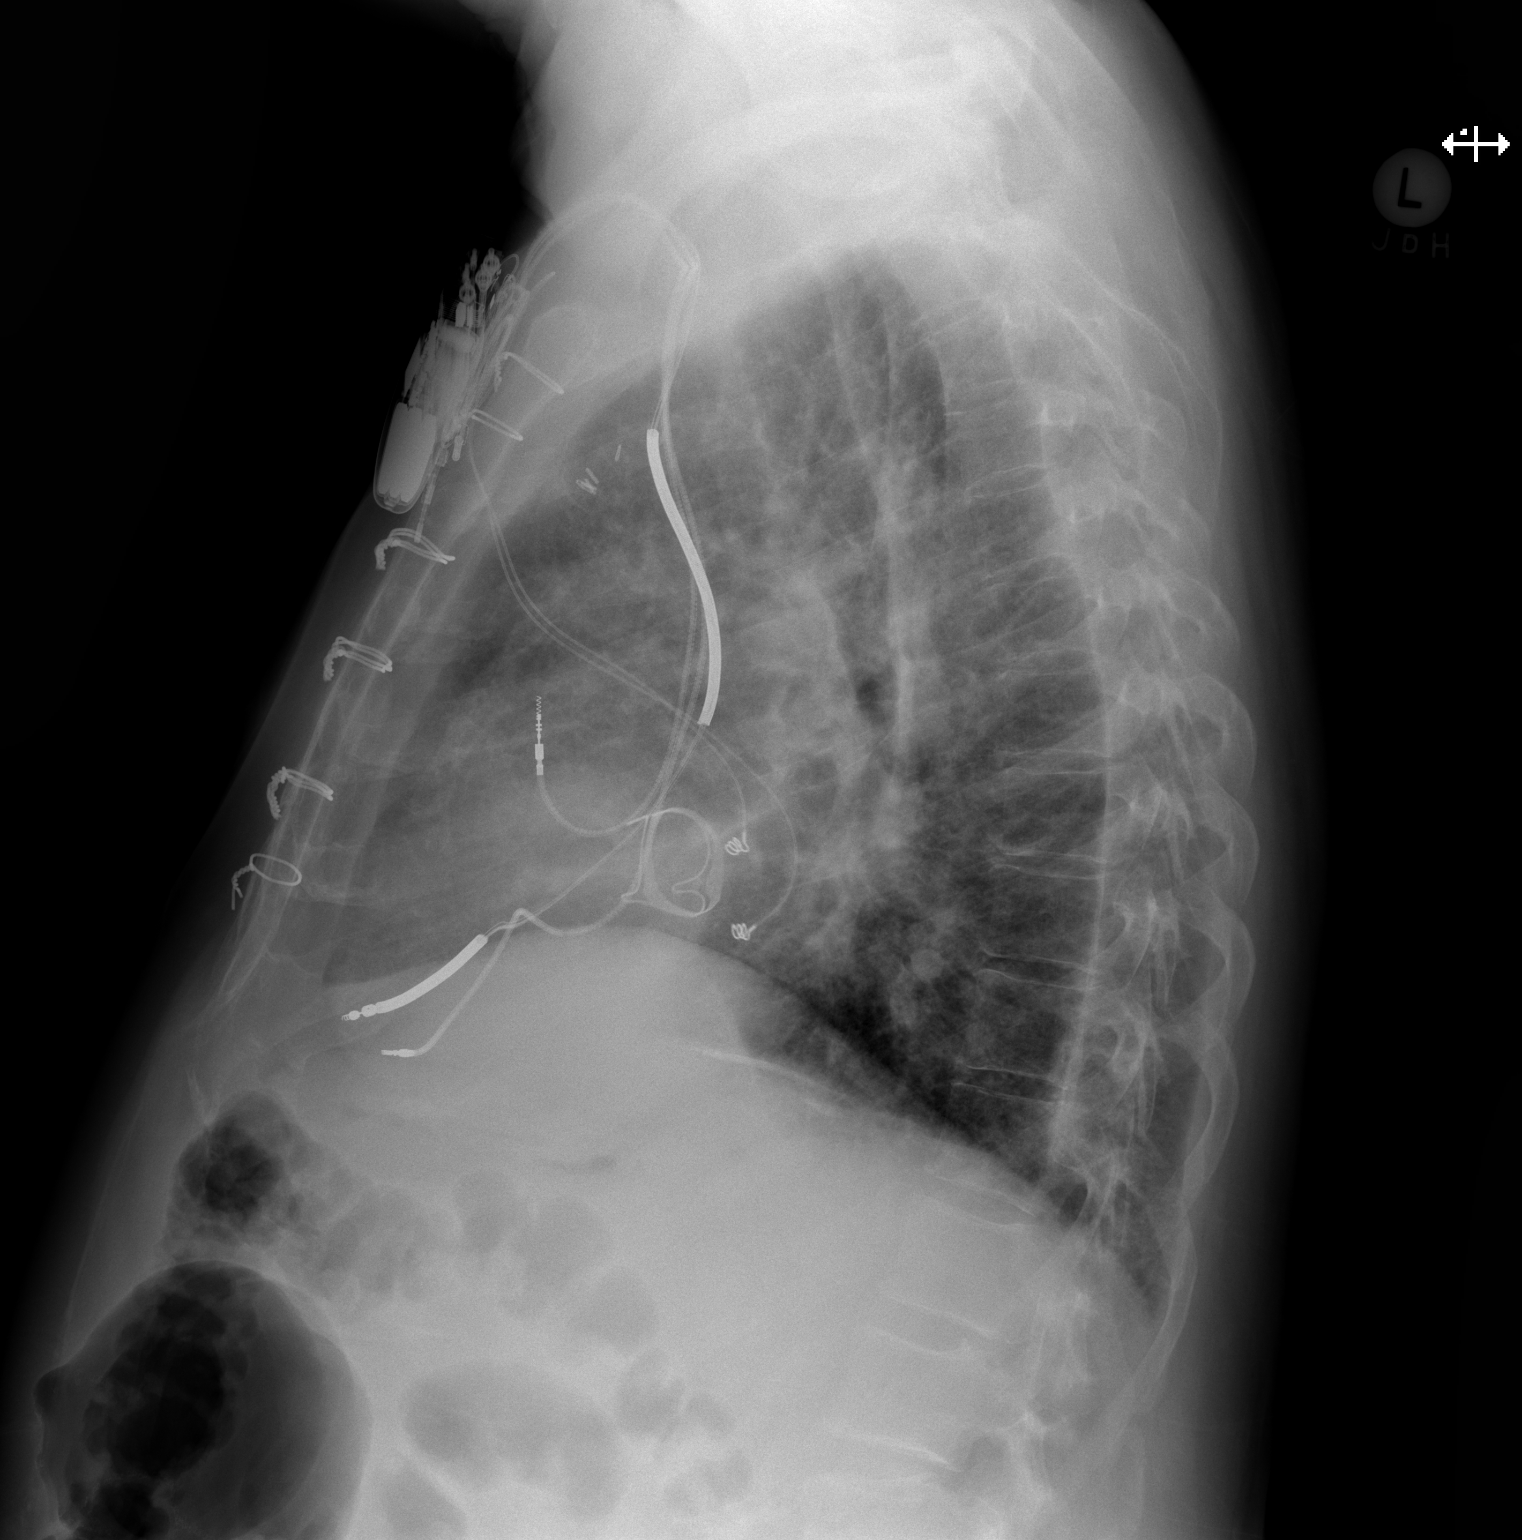

[2 of 2 positions shown; findings below may reference images not displayed]

FINDINGS: Prior median sternotomy and valve replacement. Left AICD remains in
place, unchanged. Cardiomegaly. Interstitial prominence throughout
the lungs could reflect chronic interstitial lung disease or
interstitial edema. Pleural calcifications noted on the left,
stable.
IMPRESSION: Cardiomegaly. Interstitial prominence throughout the lungs, similar
to prior study which could reflect interstitial edema or chronic
interstitial lung disease.

## 2017-09-17 ENCOUNTER — Ambulatory Visit (HOSPITAL_COMMUNITY): Payer: Medicare Other

## 2017-09-20 ENCOUNTER — Ambulatory Visit (HOSPITAL_COMMUNITY): Payer: Medicare Other

## 2017-09-22 ENCOUNTER — Ambulatory Visit (HOSPITAL_COMMUNITY): Payer: Medicare Other

## 2017-09-24 ENCOUNTER — Ambulatory Visit (HOSPITAL_COMMUNITY): Payer: Medicare Other

## 2017-09-27 ENCOUNTER — Ambulatory Visit (HOSPITAL_COMMUNITY): Payer: Medicare Other

## 2017-09-29 ENCOUNTER — Ambulatory Visit (HOSPITAL_COMMUNITY): Payer: Medicare Other

## 2017-10-01 ENCOUNTER — Ambulatory Visit (HOSPITAL_COMMUNITY): Payer: Medicare Other

## 2017-10-04 ENCOUNTER — Ambulatory Visit (HOSPITAL_COMMUNITY): Payer: Medicare Other

## 2017-10-06 ENCOUNTER — Ambulatory Visit (HOSPITAL_COMMUNITY): Payer: Medicare Other

## 2017-10-06 HISTORY — PX: TRANSTHORACIC ECHOCARDIOGRAM: SHX275

## 2017-10-08 ENCOUNTER — Ambulatory Visit (HOSPITAL_COMMUNITY): Payer: Medicare Other

## 2017-10-11 ENCOUNTER — Ambulatory Visit (HOSPITAL_COMMUNITY): Payer: Medicare Other

## 2017-10-13 ENCOUNTER — Ambulatory Visit (HOSPITAL_COMMUNITY): Payer: Medicare Other

## 2017-10-15 ENCOUNTER — Ambulatory Visit (HOSPITAL_COMMUNITY): Payer: Medicare Other

## 2017-10-18 ENCOUNTER — Ambulatory Visit (HOSPITAL_COMMUNITY): Payer: Medicare Other

## 2017-10-20 ENCOUNTER — Ambulatory Visit (HOSPITAL_COMMUNITY): Payer: Medicare Other

## 2017-10-20 ENCOUNTER — Ambulatory Visit (INDEPENDENT_AMBULATORY_CARE_PROVIDER_SITE_OTHER): Payer: Medicare Other | Admitting: *Deleted

## 2017-10-20 DIAGNOSIS — R001 Bradycardia, unspecified: Secondary | ICD-10-CM | POA: Diagnosis not present

## 2017-10-20 DIAGNOSIS — I5022 Chronic systolic (congestive) heart failure: Secondary | ICD-10-CM

## 2017-10-20 NOTE — Progress Notes (Signed)
Remote ICD transmission.   

## 2017-10-21 ENCOUNTER — Encounter (HOSPITAL_COMMUNITY): Payer: Self-pay | Admitting: Emergency Medicine

## 2017-10-21 ENCOUNTER — Emergency Department (HOSPITAL_COMMUNITY): Payer: Medicare Other

## 2017-10-21 ENCOUNTER — Inpatient Hospital Stay (HOSPITAL_COMMUNITY)
Admission: EM | Admit: 2017-10-21 | Discharge: 2017-11-03 | DRG: 291 | Disposition: A | Discharge status: Home Health | Payer: Medicare Other | Attending: Cardiology | Admitting: Cardiology

## 2017-10-21 ENCOUNTER — Other Ambulatory Visit: Payer: Self-pay

## 2017-10-21 ENCOUNTER — Inpatient Hospital Stay (HOSPITAL_COMMUNITY): Payer: Medicare Other

## 2017-10-21 DIAGNOSIS — G9341 Metabolic encephalopathy: Secondary | ICD-10-CM | POA: Diagnosis not present

## 2017-10-21 DIAGNOSIS — Z8673 Personal history of transient ischemic attack (TIA), and cerebral infarction without residual deficits: Secondary | ICD-10-CM

## 2017-10-21 DIAGNOSIS — F4320 Adjustment disorder, unspecified: Secondary | ICD-10-CM | POA: Diagnosis present

## 2017-10-21 DIAGNOSIS — N183 Chronic kidney disease, stage 3 (moderate): Secondary | ICD-10-CM | POA: Diagnosis present

## 2017-10-21 DIAGNOSIS — F319 Bipolar disorder, unspecified: Secondary | ICD-10-CM | POA: Diagnosis present

## 2017-10-21 DIAGNOSIS — E872 Acidosis: Secondary | ICD-10-CM | POA: Diagnosis not present

## 2017-10-21 DIAGNOSIS — I442 Atrioventricular block, complete: Secondary | ICD-10-CM | POA: Diagnosis present

## 2017-10-21 DIAGNOSIS — F17210 Nicotine dependence, cigarettes, uncomplicated: Secondary | ICD-10-CM | POA: Diagnosis present

## 2017-10-21 DIAGNOSIS — Z978 Presence of other specified devices: Secondary | ICD-10-CM

## 2017-10-21 DIAGNOSIS — G9340 Encephalopathy, unspecified: Secondary | ICD-10-CM

## 2017-10-21 DIAGNOSIS — Z820 Family history of epilepsy and other diseases of the nervous system: Secondary | ICD-10-CM

## 2017-10-21 DIAGNOSIS — J449 Chronic obstructive pulmonary disease, unspecified: Secondary | ICD-10-CM | POA: Diagnosis present

## 2017-10-21 DIAGNOSIS — J9621 Acute and chronic respiratory failure with hypoxia: Secondary | ICD-10-CM | POA: Diagnosis present

## 2017-10-21 DIAGNOSIS — Z86718 Personal history of other venous thrombosis and embolism: Secondary | ICD-10-CM | POA: Diagnosis not present

## 2017-10-21 DIAGNOSIS — Z888 Allergy status to other drugs, medicaments and biological substances status: Secondary | ICD-10-CM

## 2017-10-21 DIAGNOSIS — R627 Adult failure to thrive: Secondary | ICD-10-CM | POA: Diagnosis present

## 2017-10-21 DIAGNOSIS — I130 Hypertensive heart and chronic kidney disease with heart failure and stage 1 through stage 4 chronic kidney disease, or unspecified chronic kidney disease: Secondary | ICD-10-CM | POA: Diagnosis present

## 2017-10-21 DIAGNOSIS — I252 Old myocardial infarction: Secondary | ICD-10-CM

## 2017-10-21 DIAGNOSIS — F322 Major depressive disorder, single episode, severe without psychotic features: Secondary | ICD-10-CM | POA: Diagnosis not present

## 2017-10-21 DIAGNOSIS — I5043 Acute on chronic combined systolic (congestive) and diastolic (congestive) heart failure: Secondary | ICD-10-CM | POA: Diagnosis present

## 2017-10-21 DIAGNOSIS — R0602 Shortness of breath: Secondary | ICD-10-CM | POA: Diagnosis present

## 2017-10-21 DIAGNOSIS — Z955 Presence of coronary angioplasty implant and graft: Secondary | ICD-10-CM | POA: Diagnosis not present

## 2017-10-21 DIAGNOSIS — N179 Acute kidney failure, unspecified: Secondary | ICD-10-CM | POA: Diagnosis not present

## 2017-10-21 DIAGNOSIS — E1165 Type 2 diabetes mellitus with hyperglycemia: Secondary | ICD-10-CM | POA: Diagnosis not present

## 2017-10-21 DIAGNOSIS — F0280 Dementia in other diseases classified elsewhere without behavioral disturbance: Secondary | ICD-10-CM | POA: Diagnosis present

## 2017-10-21 DIAGNOSIS — Z7901 Long term (current) use of anticoagulants: Secondary | ICD-10-CM

## 2017-10-21 DIAGNOSIS — F0390 Unspecified dementia without behavioral disturbance: Secondary | ICD-10-CM | POA: Diagnosis present

## 2017-10-21 DIAGNOSIS — Z95828 Presence of other vascular implants and grafts: Secondary | ICD-10-CM

## 2017-10-21 DIAGNOSIS — J690 Pneumonitis due to inhalation of food and vomit: Secondary | ICD-10-CM | POA: Diagnosis present

## 2017-10-21 DIAGNOSIS — Z95810 Presence of automatic (implantable) cardiac defibrillator: Secondary | ICD-10-CM | POA: Diagnosis present

## 2017-10-21 DIAGNOSIS — I428 Other cardiomyopathies: Secondary | ICD-10-CM | POA: Diagnosis not present

## 2017-10-21 DIAGNOSIS — I5042 Chronic combined systolic (congestive) and diastolic (congestive) heart failure: Secondary | ICD-10-CM | POA: Diagnosis not present

## 2017-10-21 DIAGNOSIS — K7290 Hepatic failure, unspecified without coma: Secondary | ICD-10-CM | POA: Diagnosis present

## 2017-10-21 DIAGNOSIS — I509 Heart failure, unspecified: Secondary | ICD-10-CM | POA: Diagnosis not present

## 2017-10-21 DIAGNOSIS — I361 Nonrheumatic tricuspid (valve) insufficiency: Secondary | ICD-10-CM | POA: Diagnosis not present

## 2017-10-21 DIAGNOSIS — J9611 Chronic respiratory failure with hypoxia: Secondary | ICD-10-CM | POA: Diagnosis present

## 2017-10-21 DIAGNOSIS — L409 Psoriasis, unspecified: Secondary | ICD-10-CM | POA: Diagnosis present

## 2017-10-21 DIAGNOSIS — F1010 Alcohol abuse, uncomplicated: Secondary | ICD-10-CM | POA: Diagnosis not present

## 2017-10-21 DIAGNOSIS — I255 Ischemic cardiomyopathy: Secondary | ICD-10-CM | POA: Diagnosis present

## 2017-10-21 DIAGNOSIS — F310 Bipolar disorder, current episode hypomanic: Secondary | ICD-10-CM | POA: Diagnosis not present

## 2017-10-21 DIAGNOSIS — I4891 Unspecified atrial fibrillation: Secondary | ICD-10-CM | POA: Diagnosis not present

## 2017-10-21 DIAGNOSIS — M7989 Other specified soft tissue disorders: Secondary | ICD-10-CM | POA: Diagnosis not present

## 2017-10-21 DIAGNOSIS — Z01818 Encounter for other preprocedural examination: Secondary | ICD-10-CM

## 2017-10-21 DIAGNOSIS — J9602 Acute respiratory failure with hypercapnia: Secondary | ICD-10-CM | POA: Diagnosis not present

## 2017-10-21 DIAGNOSIS — I272 Pulmonary hypertension, unspecified: Secondary | ICD-10-CM | POA: Diagnosis present

## 2017-10-21 DIAGNOSIS — I482 Chronic atrial fibrillation, unspecified: Secondary | ICD-10-CM | POA: Diagnosis present

## 2017-10-21 DIAGNOSIS — K439 Ventral hernia without obstruction or gangrene: Secondary | ICD-10-CM | POA: Diagnosis present

## 2017-10-21 DIAGNOSIS — E1122 Type 2 diabetes mellitus with diabetic chronic kidney disease: Secondary | ICD-10-CM | POA: Diagnosis present

## 2017-10-21 DIAGNOSIS — I2584 Coronary atherosclerosis due to calcified coronary lesion: Secondary | ICD-10-CM | POA: Diagnosis not present

## 2017-10-21 DIAGNOSIS — K7030 Alcoholic cirrhosis of liver without ascites: Secondary | ICD-10-CM

## 2017-10-21 DIAGNOSIS — I426 Alcoholic cardiomyopathy: Secondary | ICD-10-CM | POA: Diagnosis present

## 2017-10-21 DIAGNOSIS — F3130 Bipolar disorder, current episode depressed, mild or moderate severity, unspecified: Secondary | ICD-10-CM | POA: Diagnosis not present

## 2017-10-21 DIAGNOSIS — F4321 Adjustment disorder with depressed mood: Secondary | ICD-10-CM | POA: Diagnosis present

## 2017-10-21 DIAGNOSIS — I2510 Atherosclerotic heart disease of native coronary artery without angina pectoris: Secondary | ICD-10-CM | POA: Diagnosis present

## 2017-10-21 DIAGNOSIS — K7460 Unspecified cirrhosis of liver: Secondary | ICD-10-CM

## 2017-10-21 DIAGNOSIS — Z952 Presence of prosthetic heart valve: Secondary | ICD-10-CM

## 2017-10-21 DIAGNOSIS — Z87898 Personal history of other specified conditions: Secondary | ICD-10-CM

## 2017-10-21 DIAGNOSIS — R1310 Dysphagia, unspecified: Secondary | ICD-10-CM | POA: Diagnosis present

## 2017-10-21 DIAGNOSIS — Z7984 Long term (current) use of oral hypoglycemic drugs: Secondary | ICD-10-CM

## 2017-10-21 DIAGNOSIS — J9601 Acute respiratory failure with hypoxia: Secondary | ICD-10-CM | POA: Diagnosis not present

## 2017-10-21 DIAGNOSIS — J9622 Acute and chronic respiratory failure with hypercapnia: Secondary | ICD-10-CM

## 2017-10-21 DIAGNOSIS — E785 Hyperlipidemia, unspecified: Secondary | ICD-10-CM | POA: Diagnosis present

## 2017-10-21 DIAGNOSIS — J439 Emphysema, unspecified: Secondary | ICD-10-CM | POA: Diagnosis present

## 2017-10-21 DIAGNOSIS — Z9981 Dependence on supplemental oxygen: Secondary | ICD-10-CM

## 2017-10-21 DIAGNOSIS — R062 Wheezing: Secondary | ICD-10-CM | POA: Diagnosis not present

## 2017-10-21 DIAGNOSIS — E875 Hyperkalemia: Secondary | ICD-10-CM | POA: Diagnosis not present

## 2017-10-21 DIAGNOSIS — Z953 Presence of xenogenic heart valve: Secondary | ICD-10-CM

## 2017-10-21 DIAGNOSIS — Z720 Tobacco use: Secondary | ICD-10-CM | POA: Diagnosis present

## 2017-10-21 DIAGNOSIS — J9600 Acute respiratory failure, unspecified whether with hypoxia or hypercapnia: Secondary | ICD-10-CM

## 2017-10-21 DIAGNOSIS — Z8249 Family history of ischemic heart disease and other diseases of the circulatory system: Secondary | ICD-10-CM

## 2017-10-21 LAB — CUP PACEART REMOTE DEVICE CHECK
Battery Remaining Longevity: 25 mo
Brady Statistic AP VS Percent: 0 %
Brady Statistic AS VS Percent: 5.62 %
Date Time Interrogation Session: 20190515084223
HIGH POWER IMPEDANCE MEASURED VALUE: 47 Ohm
HighPow Impedance: 39 Ohm
Implantable Lead Implant Date: 20060307
Implantable Lead Implant Date: 20080110
Implantable Lead Implant Date: 20090617
Implantable Lead Location: 753860
Implantable Lead Model: 5076
Implantable Pulse Generator Implant Date: 20150827
Lead Channel Impedance Value: 4047 Ohm
Lead Channel Impedance Value: 456 Ohm
Lead Channel Impedance Value: 532 Ohm
Lead Channel Pacing Threshold Amplitude: 0.875 V
Lead Channel Pacing Threshold Amplitude: 1 V
Lead Channel Pacing Threshold Pulse Width: 0.4 ms
Lead Channel Sensing Intrinsic Amplitude: 1 mV
Lead Channel Sensing Intrinsic Amplitude: 1 mV
Lead Channel Sensing Intrinsic Amplitude: 24.5 mV
Lead Channel Setting Pacing Amplitude: 2.5 V
Lead Channel Setting Pacing Pulse Width: 0.4 ms
Lead Channel Setting Pacing Pulse Width: 0.4 ms
MDC IDC LEAD LOCATION: 753858
MDC IDC LEAD LOCATION: 753859
MDC IDC MSMT BATTERY VOLTAGE: 2.94 V
MDC IDC MSMT LEADCHNL LV IMPEDANCE VALUE: 399 Ohm
MDC IDC MSMT LEADCHNL LV IMPEDANCE VALUE: 4047 Ohm
MDC IDC MSMT LEADCHNL RA IMPEDANCE VALUE: 570 Ohm
MDC IDC MSMT LEADCHNL RA PACING THRESHOLD AMPLITUDE: 0.625 V
MDC IDC MSMT LEADCHNL RA PACING THRESHOLD PULSEWIDTH: 0.4 ms
MDC IDC MSMT LEADCHNL RV PACING THRESHOLD PULSEWIDTH: 0.4 ms
MDC IDC MSMT LEADCHNL RV SENSING INTR AMPL: 24.5 mV
MDC IDC SET LEADCHNL RV PACING AMPLITUDE: 2.5 V
MDC IDC SET LEADCHNL RV SENSING SENSITIVITY: 0.45 mV
MDC IDC STAT BRADY AP VP PERCENT: 0 %
MDC IDC STAT BRADY AS VP PERCENT: 94.38 %
MDC IDC STAT BRADY RA PERCENT PACED: 0 %
MDC IDC STAT BRADY RV PERCENT PACED: 96.13 %

## 2017-10-21 LAB — COMPREHENSIVE METABOLIC PANEL
ALT: 24 U/L (ref 17–63)
AST: 34 U/L (ref 15–41)
Albumin: 3.3 g/dL — ABNORMAL LOW (ref 3.5–5.0)
Alkaline Phosphatase: 135 U/L — ABNORMAL HIGH (ref 38–126)
Anion gap: 9 (ref 5–15)
BILIRUBIN TOTAL: 1.2 mg/dL (ref 0.3–1.2)
BUN: 31 mg/dL — AB (ref 6–20)
CO2: 35 mmol/L — ABNORMAL HIGH (ref 22–32)
CREATININE: 1.62 mg/dL — AB (ref 0.61–1.24)
Calcium: 8.9 mg/dL (ref 8.9–10.3)
Chloride: 96 mmol/L — ABNORMAL LOW (ref 101–111)
GFR calc Af Amer: 48 mL/min — ABNORMAL LOW (ref >60)
GFR, EST NON AFRICAN AMERICAN: 41 mL/min — AB (ref >60)
Glucose, Bld: 163 mg/dL — ABNORMAL HIGH (ref 65–99)
Potassium: 4 mmol/L (ref 3.5–5.1)
Sodium: 140 mmol/L (ref 135–145)
TOTAL PROTEIN: 7 g/dL (ref 6.5–8.1)

## 2017-10-21 LAB — URINALYSIS, ROUTINE W REFLEX MICROSCOPIC
Bacteria, UA: NONE SEEN
Bilirubin Urine: NEGATIVE
GLUCOSE, UA: NEGATIVE mg/dL
Hgb urine dipstick: NEGATIVE
KETONES UR: NEGATIVE mg/dL
LEUKOCYTES UA: NEGATIVE
NITRITE: NEGATIVE
PH: 5 (ref 5.0–8.0)
Protein, ur: 100 mg/dL — AB
Specific Gravity, Urine: 1.011 (ref 1.005–1.030)

## 2017-10-21 LAB — I-STAT TROPONIN, ED: TROPONIN I, POC: 0.03 ng/mL (ref 0.00–0.08)

## 2017-10-21 LAB — I-STAT VENOUS BLOOD GAS, ED
Acid-Base Excess: 13 mmol/L — ABNORMAL HIGH (ref 0.0–2.0)
Bicarbonate: 44.1 mmol/L — ABNORMAL HIGH (ref 20.0–28.0)
O2 Saturation: 83 %
PH VEN: 7.309 (ref 7.250–7.430)
TCO2: 47 mmol/L — ABNORMAL HIGH (ref 22–32)
pCO2, Ven: 87.2 mmHg (ref 44.0–60.0)
pO2, Ven: 55 mmHg — ABNORMAL HIGH (ref 32.0–45.0)

## 2017-10-21 LAB — CBC
HCT: 39.8 % (ref 39.0–52.0)
HEMOGLOBIN: 11.5 g/dL — AB (ref 13.0–17.0)
MCH: 27 pg (ref 26.0–34.0)
MCHC: 28.9 g/dL — AB (ref 30.0–36.0)
MCV: 93.4 fL (ref 78.0–100.0)
Platelets: 175 10*3/uL (ref 150–400)
RBC: 4.26 MIL/uL (ref 4.22–5.81)
RDW: 16 % — ABNORMAL HIGH (ref 11.5–15.5)
WBC: 9.3 10*3/uL (ref 4.0–10.5)

## 2017-10-21 LAB — GLUCOSE, CAPILLARY: Glucose-Capillary: 190 mg/dL — ABNORMAL HIGH (ref 65–99)

## 2017-10-21 LAB — SEDIMENTATION RATE: SED RATE: 23 mm/h — AB (ref 0–16)

## 2017-10-21 LAB — BRAIN NATRIURETIC PEPTIDE: B NATRIURETIC PEPTIDE 5: 456.6 pg/mL — AB (ref 0.0–100.0)

## 2017-10-21 LAB — PROCALCITONIN: PROCALCITONIN: 0.13 ng/mL

## 2017-10-21 LAB — ETHANOL

## 2017-10-21 MED ORDER — VITAMIN B-1 100 MG PO TABS
100.0000 mg | ORAL_TABLET | Freq: Every day | ORAL | Status: DC
  Administered 2017-10-22 – 2017-10-25 (×4): 100 mg via ORAL
  Filled 2017-10-21 (×5): qty 1

## 2017-10-21 MED ORDER — IPRATROPIUM-ALBUTEROL 0.5-2.5 (3) MG/3ML IN SOLN: 3.0000 mL | RESPIRATORY_TRACT | Status: DC | PRN

## 2017-10-21 MED ORDER — INSULIN ASPART 100 UNIT/ML ~~LOC~~ SOLN
0.0000 [IU] | Freq: Every day | SUBCUTANEOUS | Status: DC
  Administered 2017-10-23: 2 [IU] via SUBCUTANEOUS

## 2017-10-21 MED ORDER — SPIRONOLACTONE 12.5 MG HALF TABLET
12.5000 mg | ORAL_TABLET | Freq: Every day | ORAL | Status: DC
  Administered 2017-10-21 – 2017-10-24 (×4): 12.5 mg via ORAL
  Filled 2017-10-21 (×5): qty 1

## 2017-10-21 MED ORDER — ONDANSETRON HCL 4 MG/2ML IJ SOLN: 4.0000 mg | Freq: Four times a day (QID) | INTRAMUSCULAR | Status: DC | PRN

## 2017-10-21 MED ORDER — SODIUM CHLORIDE 0.9% FLUSH: 3.0000 mL | INTRAVENOUS | Status: DC | PRN

## 2017-10-21 MED ORDER — ACETAMINOPHEN 325 MG PO TABS: 650.0000 mg | ORAL_TABLET | ORAL | Status: DC | PRN

## 2017-10-21 MED ORDER — FUROSEMIDE 10 MG/ML IJ SOLN
40.0000 mg | Freq: Once | INTRAMUSCULAR | Status: AC
  Administered 2017-10-21: 40 mg via INTRAVENOUS
  Filled 2017-10-21: qty 4

## 2017-10-21 MED ORDER — IPRATROPIUM-ALBUTEROL 0.5-2.5 (3) MG/3ML IN SOLN
3.0000 mL | Freq: Once | RESPIRATORY_TRACT | Status: AC
  Administered 2017-10-21: 3 mL via RESPIRATORY_TRACT
  Filled 2017-10-21: qty 3

## 2017-10-21 MED ORDER — FUROSEMIDE 10 MG/ML IJ SOLN
80.0000 mg | Freq: Two times a day (BID) | INTRAMUSCULAR | Status: DC
  Administered 2017-10-21 – 2017-10-26 (×10): 80 mg via INTRAVENOUS
  Filled 2017-10-21 (×10): qty 8

## 2017-10-21 MED ORDER — SODIUM CHLORIDE 0.9 % IV SOLN: 250.0000 mL | INTRAVENOUS | Status: DC | PRN

## 2017-10-21 MED ORDER — APIXABAN 5 MG PO TABS
5.0000 mg | ORAL_TABLET | Freq: Two times a day (BID) | ORAL | Status: DC
  Administered 2017-10-21 – 2017-10-25 (×8): 5 mg via ORAL
  Filled 2017-10-21 (×8): qty 1

## 2017-10-21 MED ORDER — GLIPIZIDE 5 MG PO TABS
5.0000 mg | ORAL_TABLET | Freq: Every day | ORAL | Status: DC
  Administered 2017-10-22 – 2017-10-25 (×4): 5 mg via ORAL
  Filled 2017-10-21 (×4): qty 1

## 2017-10-21 MED ORDER — ATORVASTATIN CALCIUM 40 MG PO TABS
40.0000 mg | ORAL_TABLET | Freq: Every day | ORAL | Status: DC
  Administered 2017-10-21 – 2017-10-25 (×4): 40 mg via ORAL
  Filled 2017-10-21 (×4): qty 1

## 2017-10-21 MED ORDER — PREDNISONE 20 MG PO TABS
60.0000 mg | ORAL_TABLET | Freq: Once | ORAL | Status: AC
  Administered 2017-10-21: 60 mg via ORAL
  Filled 2017-10-21: qty 3

## 2017-10-21 MED ORDER — INSULIN ASPART 100 UNIT/ML ~~LOC~~ SOLN
0.0000 [IU] | Freq: Three times a day (TID) | SUBCUTANEOUS | Status: DC
  Administered 2017-10-22 (×2): 2 [IU] via SUBCUTANEOUS
  Administered 2017-10-23: 1 [IU] via SUBCUTANEOUS
  Administered 2017-10-23 – 2017-10-24 (×2): 2 [IU] via SUBCUTANEOUS
  Administered 2017-10-24 – 2017-10-25 (×2): 1 [IU] via SUBCUTANEOUS
  Administered 2017-10-26: 2 [IU] via SUBCUTANEOUS

## 2017-10-21 MED ORDER — FAMOTIDINE 20 MG PO TABS
20.0000 mg | ORAL_TABLET | Freq: Every day | ORAL | Status: DC
  Administered 2017-10-21 – 2017-10-25 (×4): 20 mg via ORAL
  Filled 2017-10-21 (×4): qty 1

## 2017-10-21 MED ORDER — ADULT MULTIVITAMIN W/MINERALS CH
1.0000 | ORAL_TABLET | Freq: Every day | ORAL | Status: DC
  Administered 2017-10-22 – 2017-10-25 (×4): 1 via ORAL
  Filled 2017-10-21 (×5): qty 1

## 2017-10-21 MED ORDER — SODIUM CHLORIDE 0.9% FLUSH
3.0000 mL | Freq: Two times a day (BID) | INTRAVENOUS | Status: DC
  Administered 2017-10-21 – 2017-10-26 (×10): 3 mL via INTRAVENOUS

## 2017-10-21 NOTE — ED Triage Notes (Signed)
Per EMS pt dr office, decreased appetite increase sleeping, failure to thrive, pt feels weak overall. A+Ox2, pt normally on 3L, 94% oxygen, 1119/70 BP, HR 65, 246 CBG, pt has pacemaker

## 2017-10-21 NOTE — ED Provider Notes (Signed)
[facility] EMERGENCY DEPARTMENT Provider Note   CSN: [id number] Arrival date & time: 10/21/17  1159     History   Chief Complaint Chief Complaint  Patient presents with  . Weakness    HPI [name] is a 72 y.o. male.  HPI  Patient is a 72 year old male with a past medical history of CHF, dementia, hypertension, coronary artery disease who comes in today dropped off by family.  Per nursing notes, patient has been having overall failure to thrive with decreased appetite, increased sleepiness and has been difficult to arouse from sleep.  Initially on my assessment, patient is pleasant but demented complaining of abdominal pain.  Abdomen has moderate sized abdominal wall hernia, however this is chronic and is soft and easily reducible.  Family arrived later who confirmed information from nursing notes.  Family also states that patient is chronically on oxygen at home, however has been increasing the amount that he is receiving as he has had worsening shortness of breath.  Family denies any recent illness, fevers, chills, nausea or vomiting.  Family denies any facial droop or observed hemiplegia.  Past Medical History:  Diagnosis Date  . AICD (automatic cardioverter/defibrillator) present   . Alcohol abuse   . Anxiety   . Bipolar disorder (HCC)   . Biventricular automatic implantable cardioverter defibrillator in situ    a. 01/2014 s/p MDT [id number] Viva XT CRT-D (ser # [id number]).  . CAD (coronary artery disease)    a. s/p prior PCI/stenting of the LCX;  b. 08/2014 MV: EF 20%, large septal, apical, and inferior infarct from apex to base, no ischemia;  c. 08/2014 NSTEMI/Cath: LM mod distal dzs extending into ostial LCX (80%), LAD tortuous, RI nl, OM mod dzs, RCA 100 CTO with R->R and L->R collats-->Med Rx.  . Chronic atrial fibrillation (HCC)    a. CHA2DS2VASc = 6->eliquis;  b. S/P AVN RFCA an BiV ICD placement.  . Chronic respiratory failure (HCC)   . Chronic systolic  congestive heart failure (HCC)    a. 01/2014 Echo: EF 25-30%, mod conc LVH, mod dil LA.  . CKD (chronic kidney disease)    a. suspect stage II-III based on historical labs.  . Complete heart block (HCC)    a. In setting of prior AV nodal ablation r/t afib-->BiV ICD (01/2014).  . COPD (chronic obstructive pulmonary disease) (HCC)   . CVA (cerebral vascular accident) (HCC)    a. Multiple prior embolic strokes.  . Dementia   . Depression   . DVT (deep venous thrombosis) (HCC)   . Dyslipidemia   . Hyperglycemia   . Hypertension   . Mitral valve disease    a. remote mitral replacement with Bjork Shiley valve;  b. 06/2006 Redo MVR with tissue valve.  . Mixed Ischemic and Nonischemic Cardiomyopathy    a. 8/.2015 Echo: Ef 25-30%;  b. 01/2014 s/p MDT [id number] Talbot Grumbling XT CRT-D (ser # [id number]).  . On home oxygen therapy    "3L at night" (11/23/2015)  . Psoriasis    "back of his head; elbows; finger of left hand" (06/07/2017)  . Ventral hernia     Patient Active Problem List   Diagnosis Date Noted  . Chronic respiratory failure with hypoxia and hypercapnia (HCC) 06/22/2017  . CHF exacerbation (HCC) 06/06/2017  . Acute respiratory failure with hypoxia and hypercapnia (HCC)   . COPD GOLD 0 still smoking  03/02/2016  . Chronic kidney disease (CKD), stage II (mild) 11/23/2015  . CAD (coronary artery  disease)   . Dementia   . Chronic respiratory failure with hypoxia (HCC) 10/11/2015  . Chronic systolic CHF (congestive heart failure) (HCC) 05/30/2015  . Cigarette smoker 12/24/2014  . Stroke (HCC)   . Mitral valve disease   . Depression   . Mixed Ischemic and Nonischemic Cardiomyopathy   . Alcohol abuse   . Chronic anticoagulation 08/22/2014  . PVD (peripheral vascular disease) (HCC) 02/28/2014  . Mural thrombus of heart 02/07/2014  . Hypertension 06/14/2012  . Automatic implantable cardioverter-defibrillator in situ 04/04/2012  . Hyperlipidemia 10/27/2011  . Ventricular tachycardia (HCC)  07/14/2011  . Atrial fibrillation, chronic (HCC) 07/13/2011  . Right atrial thrombus (HCC) 07/11/2011    Past Surgical History:  Procedure Laterality Date  . AV NODE ABLATION  2007  . CARDIAC CATHETERIZATION  06/2006   Mild ostial L main stenosis, CFX stent patent, RCA occluded (old)  . CORONARY ARTERY BYPASS GRAFT  2008  . CYSTOSCOPY W/ URETERAL STENT PLACEMENT  07/12/2011   Procedure: CYSTOSCOPY WITH RETROGRADE PYELOGRAM/URETERAL STENT PLACEMENT;  Surgeon: Sebastian Ache, MD;  Location: [facility] OR;  Service: Urology;  Laterality: Left;  . HERNIA REPAIR    . ICD GENERATOR CHANGE  02/01/2014   Gen change to: Medtronic VIVA pulse generator, serial number [id number]  . INSERT / REPLACE / REMOVE PACEMAKER    . LEFT HEART CATHETERIZATION WITH CORONARY ANGIOGRAM N/A 08/23/2014   Procedure: LEFT HEART CATHETERIZATION WITH CORONARY ANGIOGRAM;  Surgeon: Corky Crafts, MD;  Location: [facility] CATH LAB;  Service: Cardiovascular;  Laterality: N/A;  . MITRAL VALVE REPLACEMENT     remote [facility] valve 5621 with redo tissue valve 06/2006  . PACEMAKER GENERATOR CHANGE N/A 02/01/2014   Procedure: PACEMAKER GENERATOR CHANGE;  Surgeon: Duke Salvia, MD;  Location: [facility] CATH LAB;  Service: Cardiovascular;  Laterality: N/A;  . PACEMAKER PLACEMENT  2006   Changed to CRT-D in 2009  . RIGHT/LEFT HEART CATH AND CORONARY ANGIOGRAPHY N/A 06/09/2017   Procedure: RIGHT/LEFT HEART CATH AND CORONARY ANGIOGRAPHY;  Surgeon: [name], MD;  Location: [facility] INVASIVE CV LAB;  Service: Cardiovascular;  Laterality: N/A;        Home Medications    Prior to Admission medications   Medication Sig Start Date End Date Taking? Authorizing Provider  apixaban (ELIQUIS) 5 MG TABS tablet Take 1 tablet (5 mg total) by mouth 2 (two) times daily. 02/12/17   Little Ishikawa, NP  atorvastatin (LIPITOR) 40 MG tablet Take 1 tablet (40 mg total) by mouth daily. 05/13/16   Clegg, Amy D, NP  carvedilol (COREG) 6.25 MG tablet Take 1 tablet  (6.25 mg total) by mouth 2 (two) times daily with a meal. 06/13/17   Rai, Ripudeep K, MD  ELIQUIS 5 MG TABS tablet TAKE 1 TABLET BY MOUTH TWICE A DAY 09/10/17   [name], MD  glipiZIDE (GLUCOTROL) 5 MG tablet Take 5 mg daily before breakfast by mouth.    [provider]  guaiFENesin-dextromethorphan (ROBITUSSIN DM) 100-10 MG/5ML syrup Take 5 mLs by mouth every 4 (four) hours as needed for cough. 06/13/17   Rai, Delene Ruffini, MD  loratadine (CLARITIN) 10 MG tablet Take 1 tablet (10 mg total) by mouth daily. Patient not taking: Reported on 08/22/2017 06/14/17   Rai, Delene Ruffini, MD  losartan (COZAAR) 25 MG tablet Take 1 tablet (25 mg total) by mouth daily. 06/13/17   Rai, Delene Ruffini, MD  Multiple Vitamin (MULTIVITAMIN WITH MINERALS) TABS tablet Take 1 tablet by mouth daily. 05/24/15  Osvaldo Shipper, MD  nitroGLYCERIN (NITROSTAT) 0.4 MG SL tablet Place 1 tablet (0.4 mg total) under the tongue every 5 (five) minutes as needed for chest pain. 01/06/15   Robbie Lis M, PA-C  OXYGEN Inhale 2.5 L into the lungs.    [provider]  ranitidine (ZANTAC) 150 MG tablet Take 150 mg by mouth daily as needed for heartburn.    [provider]  spironolactone (ALDACTONE) 25 MG tablet Take 0.5 tablets (12.5 mg total) by mouth daily. 01/29/17   [name], MD  thiamine 100 MG tablet Take 1 tablet (100 mg total) by mouth daily. 05/24/15   Osvaldo Shipper, MD  torsemide (DEMADEX) 20 MG tablet Take 3 tablets (60 mg total) by mouth daily. Take 60mg  (3tabs) daily. Take extra 20mg  (1 tab) in afternoon if weight up more than 20lbs. 06/14/17   Cathren Harsh, MD    Family History Family History  Problem Relation Age of Onset  . Alzheimer's disease Mother   . Heart attack Father   . Heart disease Father   . Heart attack Son   . Varicose Veins Son   . Deep vein thrombosis Son   . Heart disease Son   . Stroke Unknown   . Heart disease Unknown   . Heart disease Brother     Social  History Social History   Tobacco Use  . Smoking status: Former Smoker    Packs/day: 2.00    Years: 42.00    Pack years: 84.00    Types: Cigarettes  . Smokeless tobacco: Never Used  Substance Use Topics  . Alcohol use: Yes    Alcohol/week: 0.0 oz    Comment: 11/22/2015 "6 pack of beer/month"  . Drug use: No     Allergies   Warfarin   Review of Systems Review of Systems  Constitutional: Positive for fatigue. Negative for chills and fever.  Respiratory: Positive for shortness of breath.   Cardiovascular: Negative for chest pain.  Gastrointestinal: Negative for nausea and vomiting.  All other systems reviewed and are negative.    Physical Exam Updated Vital Signs BP 114/65 (BP Location: Left Arm)   Pulse 60   Temp 97.7 F (36.5 C) (Oral)   Resp 16   SpO2 96%   Physical Exam  Constitutional: He appears well-developed and well-nourished.  HENT:  Head: Normocephalic and atraumatic.  Eyes: Pupils are equal, round, and reactive to light. Conjunctivae and EOM are normal.  Neck: Neck supple.  Cardiovascular: Normal rate and regular rhythm.  No murmur heard. Pulmonary/Chest: Effort normal. No respiratory distress. He has rales.  Abdominal: Soft. There is no tenderness. A hernia is present.  Musculoskeletal: He exhibits no edema.  Neurological: He is alert. No cranial nerve deficit.  Skin: Skin is warm and dry.  Psychiatric: He has a normal mood and affect.  Nursing note and vitals reviewed.    ED Treatments / Results  Labs (all labs ordered are listed, but only abnormal results are displayed) Labs Reviewed  CBC - Abnormal; Notable for the following components:      Result Value   Hemoglobin 11.5 (*)    MCHC 28.9 (*)    RDW 16.0 (*)    All other components within normal limits  URINALYSIS, ROUTINE W REFLEX MICROSCOPIC - Abnormal; Notable for the following components:   Protein, ur 100 (*)    All other components within normal limits  COMPREHENSIVE METABOLIC  PANEL - Abnormal; Notable for the following components:   Chloride  96 (*)    CO2 35 (*)    Glucose, Bld 163 (*)    BUN 31 (*)    Creatinine, Ser 1.62 (*)    Albumin 3.3 (*)    Alkaline Phosphatase 135 (*)    GFR calc non Af Amer 41 (*)    GFR calc Af Amer 48 (*)    All other components within normal limits  I-STAT VENOUS BLOOD GAS, ED - Abnormal; Notable for the following components:   pCO2, Ven 87.2 (*)    pO2, Ven 55.0 (*)    Bicarbonate 44.1 (*)    TCO2 47 (*)    Acid-Base Excess 13.0 (*)    All other components within normal limits  RESPIRATORY PANEL BY PCR  BLOOD GAS, VENOUS  BRAIN NATRIURETIC PEPTIDE  PROCALCITONIN  SEDIMENTATION RATE  ETHANOL  I-STAT TROPONIN, ED  CBG MONITORING, ED    EKG EKG Interpretation  Date/Time:  Thursday Oct 21 2017 12:11:47 EDT Ventricular Rate:  60 PR Interval:    QRS Duration: 168 QT Interval:  500 QTC Calculation: 500 R Axis:   -126 Text Interpretation:  Ventricular-paced rhythm Biventricular pacemaker detected Abnormal ECG No significant change since last tracing Confirmed by Shaune Pollack [phone]) on 10/21/2017 2:44:04 PM   Radiology Dg Chest 2 View  Result Date: 10/21/2017 CLINICAL DATA:  Failure to thrive.  Hypoxia. EXAM: CHEST - 2 VIEW COMPARISON:  03/02/2016 FINDINGS: Bilateral diffuse mild interstitial thickening. Prominence of the central pulmonary vasculature. No focal consolidation, pleural effusion or pneumothorax. Calcified pleural plaque along the left diaphragmatic surface. Stable cardiomegaly. Prior median sternotomy and mitral valve replacement. Cardiac pacemaker is present. No acute osseous abnormality. IMPRESSION: 1. Cardiomegaly with pulmonary vascular congestion. 2. Likely underlying mild chronic interstitial lung disease. Electronically Signed   By: Elige Ko   On: 10/21/2017 14:00    Procedures Procedures (including critical care time)  Medications Ordered in ED Medications  furosemide (LASIX) injection  40 mg (has no administration in time range)  insulin aspart (novoLOG) injection 0-9 Units (has no administration in time range)  insulin aspart (novoLOG) injection 0-5 Units (has no administration in time range)  ipratropium-albuterol (DUONEB) 0.5-2.5 (3) MG/3ML nebulizer solution 3 mL (3 mLs Nebulization Given 10/21/17 1458)  predniSONE (DELTASONE) tablet 60 mg (60 mg Oral Given 10/21/17 1458)     Initial Impression / Assessment and Plan / ED Course  I have reviewed the triage vital signs and the nursing notes.  Pertinent labs & imaging results that were available during my care of the patient were reviewed by me and considered in my medical decision making (see chart for details).     Patient's laboratory work-up largely within normal limits with the following exceptions.  Patient CO2 is increased from his baseline, however pH is within normal range.  Chest x-ray shows pulmonary edema on a background of likely chronic interstitial lung disease.  Creatinine of 1.6, however this is consistent with patient's previous values.  Initially on auscultation, patient had decreased breath sounds globally that were difficult to appreciate.  Patient was given DuoNeb as well as prednisone with improvement of air movement, however on reevaluation, significant rales bilaterally were appreciated.  Patient given 40 mg Lasix.  Will admit patient to the hospitalist service for further evaluation and care.  Final Clinical Impressions(s) / ED Diagnoses   Final diagnoses:  None    ED Discharge Orders    None       Caren Griffins, MD 10/21/17 [phone]  Shaune Pollack, MD 10/21/17 Rosamaria Lints

## 2017-10-21 NOTE — ED Notes (Signed)
Admitting at bedside 

## 2017-10-21 NOTE — Care Management (Signed)
EDCM reviewed CM consult. Patient is being admitted. Unit CM to follow for discharge needs. Michiah Mudry RN CCM  

## 2017-10-21 NOTE — H&P (Addendum)
History and Physical    [name] TOI:[id number] DOB: 07-24-1945 DOA: 10/21/2017  **Will admit patient based on the expectation that the patient will need hospitalization/ hospital care that crosses at least 2 midnights  PCP: Wenda Low, MD   Attending physician: Evangeline Gula  Patient coming from/Resides with: Private residence  Chief Complaint: Shortness of breath, sleepiness  HPI: [name] is a 72 y.o. 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  Patient also has O2 dependent COPD with ongoing tobacco abuse and history of heavy alcohol use although apparently has not had alcoholic beverages for over 2 years.  He was also noted to have severe PAD.  He follows in the outpatient setting with heart failure clinic.  Patient was at a physician's office today and was sent to the ER for further evaluation.  It was reported he had decreased appetite, increased fatigue and failure to thrive with generalized weakness all over.  On 3 L oxygen at rest also oximetry was 94%, he was normotensive, CBG 246.  Exam he was noted with diffuse posterior crackles.  X-ray revealed cardiomegaly with pulmonary vascular congestion superimposed on mild chronic interstitial lung disease.  In my discussion with the patient he denies orthopnea or dyspnea on exertion.  He is noted with 2+ bilateral lower extremity edema.  He does not know his baseline weight.  He was unable to elucidate whether he had experienced dietary indiscretion.  He reports using medications as prescribed.  He is overall a poor historian.  No family at  bedside.  In review of heart failure team records dry weight should be around 161 pounds.  No weight has been obtained yet in the ER.  BNP pending at time of my evaluation.  EDP has treated as combination of COPD as well as heart failure exacerbation.  Records have become available from patient's primary care physician's office.  It is noted that the patient lost his sister 3 weeks ago.  He is sleeping all the time not eating and not taking his medications and not showering  ED Course:  Vital Signs: BP 114/65 (BP Location: Left Arm)   Pulse 60   Temp 97.7 F (36.5 C) (Oral)   Resp 16   SpO2 96%  Chest x-ray: As above Lab data: Sodium 140, potassium 4.0, chloride 96, CO2 35, glucose 163, BUN 31, creatinine 1.62, anion gap 9, alkaline phosphatase 135, albumin 3.3, LFTs normal, troponin normal, white count 9300 differential not obtained, hemoglobin 11.5, platelets 175,000 urinalysis unremarkable Medications and treatments: DuoNeb x1, prednisone 60 mg x 1  Review of Systems:  In addition to the HPI above, **patient is poor historian due to known dementia and bipolar disorder No Fever-chills, myalgias or other constitutional symptoms No Headache, changes with Vision or hearing, new weakness, tingling, numbness in any extremity, dizziness, dysarthria or word finding difficulty, gait disturbance or imbalance, tremors or seizure activity No problems swallowing food or Liquids, indigestion/reflux, choking or coughing while eating, abdominal pain with or after eating No Chest pain, Cough, palpitations, orthopnea or DOE No Abdominal pain, N/V, melena,hematochezia, dark tarry stools, constipation No dysuria, malodorous urine, hematuria or flank pain No new skin rashes, lesions,  masses or bruises, No new joint pains, aches, swelling or redness No recent unintentional weight gain or loss No polyuria, polydypsia or polyphagia   Past Medical History:  Diagnosis Date  . AICD (automatic  cardioverter/defibrillator) present   . Alcohol abuse   . Anxiety   . Bipolar disorder ([place])   . Biventricular automatic implantable cardioverter defibrillator in situ    a. 01/2014 s/p MDT [id number] Viva XT CRT-D (ser # [id number]).  . CAD (coronary artery disease)    a. s/p prior PCI/stenting of the LCX;  b. 08/2014 MV: EF 20%, large septal, apical, and inferior infarct from apex to base, no ischemia;  c. 08/2014 NSTEMI/Cath: LM mod distal dzs extending into ostial LCX (80%), LAD tortuous, RI nl, OM mod dzs, RCA 100 CTO with R->R and L->R collats-->Med Rx.  . Chronic atrial fibrillation (HCC)    a. CHA2DS2VASc = 6->eliquis;  b. S/P AVN RFCA an BiV ICD placement.  . Chronic respiratory failure ([place])   . Chronic systolic congestive heart failure ([place])    a. 01/2014 Echo: EF 25-30%, mod conc LVH, mod dil LA.  . CKD (chronic kidney disease)    a. suspect stage II-III based on historical labs.  . Complete heart block ([place])    a. In setting of prior AV nodal ablation r/t afib-->BiV ICD (01/2014).  . COPD (chronic obstructive pulmonary disease) ([place])   . CVA (cerebral vascular accident) ([place])    a. Multiple prior embolic strokes.  . Dementia   . Depression   . DVT (deep venous thrombosis) ([place])   . Dyslipidemia   . Hyperglycemia   . Hypertension   . Mitral valve disease    a. remote mitral replacement with Bjork Shiley valve;  b. 06/2006 Redo MVR with tissue valve.  . Mixed Ischemic and Nonischemic Cardiomyopathy    a. 8/.2015 Echo: Ef 25-30%;  b. 01/2014 s/p MDT [id number] Pershing Proud XT CRT-D (ser # [id number]).  . On home oxygen therapy    "3L at night" (11/23/2015)  . Psoriasis    "back of his head; elbows; finger of left hand" (06/07/2017)  . Ventral hernia     Past Surgical History:  Procedure Laterality Date  . AV NODE ABLATION  2007  . CARDIAC CATHETERIZATION  06/2006   Mild ostial L main stenosis, CFX stent patent, RCA occluded (old)  . CORONARY ARTERY BYPASS GRAFT  2008  . CYSTOSCOPY W/  URETERAL STENT PLACEMENT  07/12/2011   Procedure: CYSTOSCOPY WITH RETROGRADE PYELOGRAM/URETERAL STENT PLACEMENT;  Surgeon: Alexis Frock, MD;  Location: [facility];  Service: Urology;  Laterality: Left;  . HERNIA REPAIR    . ICD GENERATOR CHANGE  02/01/2014   Gen change to: Medtronic VIVA pulse generator, serial number [id number]  . INSERT / REPLACE / REMOVE PACEMAKER    . LEFT HEART CATHETERIZATION WITH CORONARY ANGIOGRAM N/A 08/23/2014   Procedure: LEFT HEART CATHETERIZATION WITH CORONARY ANGIOGRAM;  Surgeon: Jettie Booze, MD;  Location: [facility] CATH LAB;  Service: Cardiovascular;  Laterality: N/A;  . MITRAL VALVE REPLACEMENT     remote [facility] valve [id number] with redo tissue valve 06/2006  . PACEMAKER GENERATOR CHANGE N/A 02/01/2014   Procedure: PACEMAKER GENERATOR CHANGE;  Surgeon: Deboraha Sprang, MD;  Location: [facility] CATH LAB;  Service: Cardiovascular;  Laterality: N/A;  . PACEMAKER PLACEMENT  2006   Changed to CRT-D in 2009  . RIGHT/LEFT HEART CATH AND CORONARY ANGIOGRAPHY N/A 06/09/2017   Procedure: RIGHT/LEFT HEART CATH AND CORONARY ANGIOGRAPHY;  Surgeon: Loralie Champagne  S, MD;  Location: [facility] CV LAB;  Service: Cardiovascular;  Laterality: N/A;    Social History   Socioeconomic History  . Marital status: Divorced    Spouse name: Not on file  . Number of children: Not on file  . Years of education: Not on file  . Highest education level: Not on file  Occupational History  . Occupation: Retired Administrator  . Financial resource strain: Not on file  . Food insecurity:    Worry: Not on file    Inability: Not on file  . Transportation needs:    Medical: Not on file    Non-medical: Not on file  Tobacco Use  . Smoking status: Former Smoker    Packs/day: 2.00    Years: 42.00    Pack years: 84.00    Types: Cigarettes  . Smokeless tobacco: Never Used  Substance and Sexual Activity  . Alcohol use: Yes    Alcohol/week: 0.0 oz    Comment: 11/22/2015 "6 pack of  beer/month"  . Drug use: No  . Sexual activity: Not Currently  Lifestyle  . Physical activity:    Days per week: Not on file    Minutes per session: Not on file  . Stress: Not on file  Relationships  . Social connections:    Talks on phone: Not on file    Gets together: Not on file    Attends religious service: Not on file    Active member of club or organization: Not on file    Attends meetings of clubs or organizations: Not on file    Relationship status: Not on file  . Intimate partner violence:    Fear of current or ex partner: Not on file    Emotionally abused: Not on file    Physically abused: Not on file    Forced sexual activity: Not on file  Other Topics Concern  . Not on file  Social History Narrative   ** Merged History Encounter **        Mobility: Independent Work history: Not obtained   Allergies  Allergen Reactions  . Warfarin Other (See Comments)    Non compliance and ETOH abuse    Family History  Problem Relation Age of Onset  . Alzheimer's disease Mother   . Heart attack Father   . Heart disease Father   . Heart attack Son   . Varicose Veins Son   . Deep vein thrombosis Son   . Heart disease Son   . Stroke Unknown   . Heart disease Unknown   . Heart disease Brother      Prior to Admission medications   Medication Sig Start Date End Date Taking? Authorizing Provider  apixaban (ELIQUIS) 5 MG TABS tablet Take 1 tablet (5 mg total) by mouth 2 (two) times daily. 02/12/17   Arbutus Leas, NP  atorvastatin (LIPITOR) 40 MG tablet Take 1 tablet (40 mg total) by mouth daily. 05/13/16   Clegg, Amy D, NP  carvedilol (COREG) 6.25 MG tablet Take 1 tablet (6.25 mg total) by mouth 2 (two) times daily with a meal. 06/13/17   Rai, Ripudeep K, MD  ELIQUIS 5 MG TABS tablet TAKE 1 TABLET BY MOUTH TWICE A DAY 09/10/17   Larey Dresser, MD  glipiZIDE (GLUCOTROL) 5 MG tablet Take 5 mg daily before breakfast by mouth.    [provider]    guaiFENesin-dextromethorphan (ROBITUSSIN DM) 100-10 MG/5ML syrup Take 5 mLs by mouth every 4 (  four) hours as needed for cough. 06/13/17   Rai, Vernelle Emerald, MD  loratadine (CLARITIN) 10 MG tablet Take 1 tablet (10 mg total) by mouth daily. Patient not taking: Reported on 08/22/2017 06/14/17   Rai, Vernelle Emerald, MD  losartan (COZAAR) 25 MG tablet Take 1 tablet (25 mg total) by mouth daily. 06/13/17   Rai, Vernelle Emerald, MD  Multiple Vitamin (MULTIVITAMIN WITH MINERALS) TABS tablet Take 1 tablet by mouth daily. 05/24/15   Bonnielee Haff, MD  nitroGLYCERIN (NITROSTAT) 0.4 MG SL tablet Place 1 tablet (0.4 mg total) under the tongue every 5 (five) minutes as needed for chest pain. 01/06/15   Lyda Jester M, PA-C  OXYGEN Inhale 2.5 L into the lungs.    [provider]  ranitidine (ZANTAC) 150 MG tablet Take 150 mg by mouth daily as needed for heartburn.    [provider]  spironolactone (ALDACTONE) 25 MG tablet Take 0.5 tablets (12.5 mg total) by mouth daily. 01/29/17   Larey Dresser, MD  thiamine 100 MG tablet Take 1 tablet (100 mg total) by mouth daily. 05/24/15   Bonnielee Haff, MD  torsemide (DEMADEX) 20 MG tablet Take 3 tablets (60 mg total) by mouth daily. Take 44m (3tabs) daily. Take extra 222m(1 tab) in afternoon if weight up more than 20lbs. 06/14/17   RaMendel CorningMD    Physical Exam: Vitals:   10/21/17 1227  BP: 114/65  Pulse: 60  Resp: 16  Temp: 97.7 F (36.5 C)  TempSrc: Oral  SpO2: 96%      Constitutional: NAD, calm, comfortable-appears unkempt, has excessive amount of dander from scalp on T-shirt Eyes: PERRL, lids and conjunctivae normal ENMT: Mucous membranes are moist. Posterior pharynx clear of any exudate or lesions. Poordentition.  Neck: normal, bilateral inspiratory and expiratory crackles heard on posterior lung exam throughout all lung fields. Normal respiratory effort without accessory muscle use rest.  3 L Cardiovascular: Regular rate and  rhythm-100% ventricular paced, no murmurs / rubs / gallops.  2+ bilateral lower extremity edema. 2+ pedal pulses. No carotid bruits.  6 cm JVD bilaterally Abdomen: no tenderness, no masses palpated. No hepatosplenomegaly. Bowel sounds positive.  Musculoskeletal: no clubbing / cyanosis. No joint deformity upper and lower extremities. Good ROM, no contractures. Normal muscle tone.  Skin: no rashes, lesions, ulcers. No induration Neurologic: CN 2-12 grossly intact. Sensation intact, DTR normal. Strength 5/5 x all 4 extremities.  Psychiatric: Alert and oriented x name only.  Consistent/poor historian. normal mood.    Labs on Admission: I have personally reviewed following labs and imaging studies  CBC: Recent Labs  Lab 10/21/17 1331  WBC 9.3  HGB 11.5*  HCT 39.8  MCV 93.4  PLT 17299 Basic Metabolic Panel: Recent Labs  Lab 10/21/17 1331  NA 140  K 4.0  CL 96*  CO2 35*  GLUCOSE 163*  BUN 31*  CREATININE 1.62*  CALCIUM 8.9   GFR: CrCl cannot be calculated (Unknown ideal weight.). Liver Function Tests: Recent Labs  Lab 10/21/17 1331  AST 34  ALT 24  ALKPHOS 135*  BILITOT 1.2  PROT 7.0  ALBUMIN 3.3*   No results for input(s): LIPASE, AMYLASE in the last 168 hours. No results for input(s): AMMONIA in the last 168 hours. Coagulation Profile: No results for input(s): INR, PROTIME in the last 168 hours. Cardiac Enzymes: No results for input(s): CKTOTAL, CKMB, CKMBINDEX, TROPONINI in the last 168 hours. BNP (last 3 results) Recent Labs    05/25/17 1152  PROBNP 1,108*   HbA1C: No results for input(s): HGBA1C in the last 72 hours. CBG: No results for input(s): GLUCAP in the last 168 hours. Lipid Profile: No results for input(s): CHOL, HDL, LDLCALC, TRIG, CHOLHDL, LDLDIRECT in the last 72 hours. Thyroid Function Tests: No results for input(s): TSH, T4TOTAL, FREET4, T3FREE, THYROIDAB in the last 72 hours. Anemia Panel: No results for input(s): VITAMINB12, FOLATE,  FERRITIN, TIBC, IRON, RETICCTPCT in the last 72 hours. Urine analysis:    Component Value Date/Time   COLORURINE YELLOW 10/21/2017 [place] 10/21/2017 1209   LABSPEC 1.011 10/21/2017 1209   PHURINE 5.0 10/21/2017 1209   GLUCOSEU NEGATIVE 10/21/2017 1209   HGBUR NEGATIVE 10/21/2017 1209   [place] 10/21/2017 1209   KETONESUR NEGATIVE 10/21/2017 1209   PROTEINUR 100 (A) 10/21/2017 1209   UROBILINOGEN 0.2 12/24/2014 0311   NITRITE NEGATIVE 10/21/2017 1209   LEUKOCYTESUR NEGATIVE 10/21/2017 1209   Sepsis Labs: _0 (procalcitonin:4,lacticidven:4) )No results found for this or any previous visit (from the past 240 hour(s)).   Radiological Exams on Admission: Dg Chest 2 View  Result Date: 10/21/2017 CLINICAL DATA:  Failure to thrive.  Hypoxia. EXAM: CHEST - 2 VIEW COMPARISON:  03/02/2016 FINDINGS: Bilateral diffuse mild interstitial thickening. Prominence of the central pulmonary vasculature. No focal consolidation, pleural effusion or pneumothorax. Calcified pleural plaque along the left diaphragmatic surface. Stable cardiomegaly. Prior median sternotomy and mitral valve replacement. Cardiac pacemaker is present. No acute osseous abnormality. IMPRESSION: 1. Cardiomegaly with pulmonary vascular congestion. 2. Likely underlying mild chronic interstitial lung disease. Electronically Signed   By: Kathreen Devoid   On: 10/21/2017 14:00    EKG: (Independently reviewed) 100% ventricular paced rhythm  Assessment/Plan Principal Problem:   Acute on chronic combined systolic (congestive) and diastolic (congestive) heart failure  -Patient sent to ER from PCP after reports of failure to thrive, lethargy and ?  Shortness of breath with reports of patient turning up home O2 from baseline of 3 L -Chest x-ray consistent with edema and noted with crackles on exam and bilateral lower extremity 2+ edema and JVD -Lasix 80 mg IV every 12 hours with home dose Aldactone -Hold  home Cozaar in context of baseline renal insufficiency/CKD and desire to aggressively diurese -Hold home carvedilol secondary to suboptimal blood pressure and desire to aggressively diurese -Daily weights, strict I's/O -Echocardiogram from 2018: EF 25 to 28%, grade 2 diastolic dysfunction, moderate RV systolic dysfunction and mild TR  -BNP pending -Suspect may have a degree of dietary indiscretion -Per PCP note patient has not taking his medications for several weeks since his sister died  Active Problems:   Chronic hypoxemic respiratory failure 2/2 COPD (chronic obstructive pulmonary disease) with ongoing Tobacco abuse -Patient reportedly increasing home O2 due to apparent issues with shortness of breath although patient denies orthopnea, dyspnea on exertion, cough, fevers chills or palpitations -Was given DuoNeb and steroids in ER but exam more consistent with heart failure -We will obtain CT of the chest noncontrasted to better clarify; also check procalcitonin and ESR as well as respiratory viral panel -Continue oxygen -Not on LABA or SABA prior to admission -DuoNeb prn -Patient reports smokes 2 packs of cigarettes per day    Situational depression/grief reaction -Patient likely severely depressed at the loss of his sister 3 to go with family reporting that the patient is sleeping all the time, he is not eating, he is not showering and is not taking his medications -Not on psychotropic medications prior to admission and  may benefit from addition of SSRI     Chronic atrial fibrillation  -100% ventricular paced -Continue Eliquis and carvedilol -CHA2DS2-VASc=7 -Struve prior AV nodal ablation subsequent implantation of biventricular AICD/pacemaker    CKD (chronic kidney disease) stage 3, GFR 30-59 ml/min  -GFR stable and at baseline -Follow labs closely with diuresis    History of mitral valve replacement    Coronary artery disease/CABG -Last cardiac catheterization January 2019  90% distal LM, chronic totally occluded pRCA.  Not CABG candidate secondary to severe COPD.  Severe PAD support Impella candidate therefore poor LM PCI candidate. -Medical management; continue carvedilol and Lipitor    Bipolar disorder/Dementia  -Likely contributing to patient's difficulty managing home care -Not on psychotropic medications prior to admission    History of CVA (cerebrovascular accident) -Eliquis and statin     **Additional lab, imaging and/or diagnostic evaluation at discretion of supervising physician  DVT prophylaxis: Eliquis Code Status: Full Family Communication: No family at bedside Disposition Plan: Home Consults called: None    ELLIS,ALLISON L. ANP-BC [name] Pager [phone]   If 7PM-7AM, please contact night-coverage [email or web address] Password [email or web address]  10/21/2017, 4:05 PM

## 2017-10-22 ENCOUNTER — Ambulatory Visit (HOSPITAL_COMMUNITY): Payer: Medicare Other

## 2017-10-22 ENCOUNTER — Encounter: Payer: Self-pay | Admitting: Cardiology

## 2017-10-22 LAB — RESPIRATORY PANEL BY PCR
ADENOVIRUS-RVPPCR: NOT DETECTED
Bordetella pertussis: NOT DETECTED
CHLAMYDOPHILA PNEUMONIAE-RVPPCR: NOT DETECTED
CORONAVIRUS 229E-RVPPCR: NOT DETECTED
CORONAVIRUS HKU1-RVPPCR: NOT DETECTED
CORONAVIRUS NL63-RVPPCR: NOT DETECTED
Coronavirus OC43: NOT DETECTED
INFLUENZA A-RVPPCR: NOT DETECTED
Influenza B: NOT DETECTED
MYCOPLASMA PNEUMONIAE-RVPPCR: NOT DETECTED
Metapneumovirus: NOT DETECTED
Parainfluenza Virus 1: NOT DETECTED
Parainfluenza Virus 2: NOT DETECTED
Parainfluenza Virus 3: NOT DETECTED
Parainfluenza Virus 4: NOT DETECTED
Respiratory Syncytial Virus: NOT DETECTED
Rhinovirus / Enterovirus: NOT DETECTED

## 2017-10-22 LAB — GLUCOSE, CAPILLARY
GLUCOSE-CAPILLARY: 80 mg/dL (ref 65–99)
Glucose-Capillary: 187 mg/dL — ABNORMAL HIGH (ref 65–99)
Glucose-Capillary: 169 mg/dL — ABNORMAL HIGH (ref 65–99)
Glucose-Capillary: 80 mg/dL (ref 65–99)

## 2017-10-22 LAB — BASIC METABOLIC PANEL
Anion gap: 10 (ref 5–15)
BUN: 38 mg/dL — AB (ref 6–20)
CALCIUM: 8.9 mg/dL (ref 8.9–10.3)
CO2: 38 mmol/L — AB (ref 22–32)
CREATININE: 1.73 mg/dL — AB (ref 0.61–1.24)
Chloride: 92 mmol/L — ABNORMAL LOW (ref 101–111)
GFR calc Af Amer: 44 mL/min — ABNORMAL LOW (ref >60)
GFR calc non Af Amer: 38 mL/min — ABNORMAL LOW (ref >60)
GLUCOSE: 208 mg/dL — AB (ref 65–99)
Potassium: 4.2 mmol/L (ref 3.5–5.1)
Sodium: 140 mmol/L (ref 135–145)

## 2017-10-22 LAB — MRSA PCR SCREENING: MRSA by PCR: POSITIVE — AB

## 2017-10-22 MED ORDER — DIGOXIN 125 MCG PO TABS
0.1250 mg | ORAL_TABLET | Freq: Every day | ORAL | Status: DC
  Administered 2017-10-22 – 2017-10-25 (×4): 0.125 mg via ORAL
  Filled 2017-10-22 (×5): qty 1

## 2017-10-22 MED ORDER — ORAL CARE MOUTH RINSE
15.0000 mL | Freq: Two times a day (BID) | OROMUCOSAL | Status: DC
  Administered 2017-10-22 – 2017-10-25 (×5): 15 mL via OROMUCOSAL

## 2017-10-22 MED ORDER — CARVEDILOL 3.125 MG PO TABS
3.1250 mg | ORAL_TABLET | Freq: Two times a day (BID) | ORAL | Status: DC
  Administered 2017-10-22 – 2017-10-25 (×6): 3.125 mg via ORAL
  Filled 2017-10-22 (×7): qty 1

## 2017-10-22 MED ORDER — CHLORHEXIDINE GLUCONATE CLOTH 2 % EX PADS
6.0000 | MEDICATED_PAD | Freq: Every day | CUTANEOUS | Status: AC
  Administered 2017-10-22 – 2017-10-27 (×5): 6 via TOPICAL

## 2017-10-22 MED ORDER — MUPIROCIN 2 % EX OINT
1.0000 "application " | TOPICAL_OINTMENT | Freq: Two times a day (BID) | CUTANEOUS | Status: DC
  Administered 2017-10-22 – 2017-10-26 (×9): 1 via NASAL
  Filled 2017-10-22 (×4): qty 22

## 2017-10-22 NOTE — Discharge Instructions (Signed)

## 2017-10-22 NOTE — Progress Notes (Signed)
PROGRESS NOTE    [name]  SRP:[id number] DOB: [birth year] DOA: 10/21/2017 PCP: Wenda Low, MD   Brief Narrative:72 y.o. 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  Patient also has O2 dependent COPD with ongoing tobacco abuse and history of heavy alcohol use although apparently has not had alcoholic beverages for over 2 years.  He was also noted to have severe PAD.  He follows in the outpatient setting with heart failure clinic.  Patient was at a physician's office today and was sent to the ER for further evaluation.  It was reported he had decreased appetite, increased fatigue and failure to thrive with generalized weakness all over.  On 3 L oxygen at rest also oximetry was 94%, he was normotensive, CBG 246.  Exam he was noted with diffuse posterior crackles.  X-ray revealed cardiomegaly with pulmonary vascular congestion superimposed on mild chronic interstitial lung disease.  In my discussion with the patient he denies orthopnea or dyspnea on exertion.  He is noted with 2+ bilateral lower extremity edema.  He does not know his baseline weight.  He was unable to elucidate whether he had experienced dietary indiscretion.  He reports using medications as prescribed.  He is overall a poor historian.  No family at bedside.  In review of heart failure team records dry weight should be around 161 pounds.  No weight has been obtained yet in the ER.  BNP pending at time of my evaluation.  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  Shortness of breath with reports of patient turning up home O2 from baseline of 3 L -Chest x-ray consistent with edema and noted with crackles on exam and bilateral lower extremity 2+ edema and JVD -Lasix 80 mg IV every 12 hours with home dose Aldactone -Hold home Cozaar in context of baseline renal insufficiency/CKD and desire to aggressively diurese -Hold home carvedilol secondary to suboptimal blood pressure and desire to aggressively diurese -Daily weights, strict I's/O -Echocardiogram from 2018: EF 25 to 24%, grade 2 diastolic dysfunction, moderate RV systolic dysfunction and mild TR  -BNP 456 on admit. -Suspect may have a degree of dietary indiscretion -Per PCP note patient has not taking his medications for several weeks since his sister died  Chronic hypoxemic respiratory failure 2/2 COPD (chronic obstructive pulmonary disease) with ongoing Tobacco abuse -Patient reportedly  increasing home O2 due to apparent issues with shortness of breath although patient denies orthopnea, dyspnea on exertion, cough, fevers chills or palpitations -Was given DuoNeb and steroids in ER but exam more consistent with heart  failure -We will obtain CT of the chest noncontrasted to better clarify; also check procalcitonin and ESR as well as respiratory viral panel -Continue oxygen -Not on LABA or SABA prior to admission -DuoNeb prn -Patient reports smokes 2 packs of cigarettes per day    Situational depression/grief reaction -Patient likely severely depressed at the loss of his sister 3 to go with family reporting that the patient is sleeping all the time, he is not eating, he is not showering and is not taking his medications -Not on psychotropic medications prior to admission and may benefit from addition of SSRI   Chronic atrial fibrillation  -100% ventricular paced -Continue Eliquis and carvedilol -CHA2DS2-VASc=7 -Struve prior AV nodal ablation subsequent implantation of biventricular AICD/pacemaker    CKD (chronic kidney disease) stage 3, GFR 30-59 ml/min  -GFR stable and at baseline -Follow labs closely with diuresis, creatinine increased to 1.73 from 1.62 on IV diuresis.  Follow-up daily BMP.    History of mitral valve replacement    Coronary artery disease/CABG -Last cardiac catheterization January 2019 90% distal LM, chronic totally occluded pRCA.  Not CABG candidate secondary to severe COPD.  Severe PAD support Impella candidate therefore poor LM PCI candidate. -Medical management; continue carvedilol and Lipitor    Bipolar disorder/Dementia  -Likely contributing to patient's difficulty managing home care -Not on psychotropic medications prior to admission    History of CVA (cerebrovascular accident) -Eliquis and statin       DVT prophylaxis: eliquis Code Status: full Family Communication: none Disposition Plan.[email or web address]   Consultants: none  Procedures:none Antimicrobials: none Subjective:sitting up in chair in nad.denies any chest pain,sob   Objective: Vitals:   10/22/17 0529 10/22/17 0535 10/22/17 0905 10/22/17 1128  BP: 139/62  (!) 129/59 117/62  Pulse: (!) 59  (!)  59 60  Resp: _0 Temp: 97.6 F (36.4 C)  97.8 F (36.6 C)   TempSrc: Oral  Oral   SpO2: 97%  91% 94%  Weight:  74.9 kg (165 lb 2 oz)    Height:        Intake/Output Summary (Last 24 hours) at 10/22/2017 1317 Last data filed at 10/22/2017 0900 Gross per 24 hour  Intake 480 ml  Output 1150 ml  Net -670 ml   Filed Weights   10/21/17 1819 10/22/17 0535  Weight: 75.5 kg (166 lb 6.4 oz) 74.9 kg (165 lb 2 oz)    Examination:  General exam: Appears calm and comfortable  Respiratory system: decreased breath  sounds auscultation. Respiratory effort normal. Cardiovascular system: S1 & S2 heard, RRR. No JVD, murmurs, rubs, gallops or clicks. No pedal edema. Gastrointestinal system: Abdomen is nondistended, soft and nontender. No organomegaly or masses felt. Normal bowel sounds heard. Central nervous system: Alert and oriented. No focal neurological deficits. Extremities: Symmetric 5 x 5 power. Skin: No rashes, lesions or ulcers Psychiatry: Judgement and insight appear normal. Mood & affect appropriate.     Data Reviewed: I have personally reviewed following labs and imaging studies  CBC: Recent Labs  Lab 10/21/17 1331  WBC 9.3  HGB 11.5*  HCT 39.8  MCV 93.4  PLT 893   Basic Metabolic Panel: Recent Labs  Lab 10/21/17 1331 10/22/17 0553  NA 140 140  K 4.0 4.2  CL 96*  92*  CO2 35* 38*  GLUCOSE 163* 208*  BUN 31* 38*  CREATININE 1.62* 1.73*  CALCIUM 8.9 8.9   GFR: Estimated Creatinine Clearance: 36.8 mL/min (A) (by C-G formula based on SCr of 1.73 mg/dL (H)). Liver Function Tests: Recent Labs  Lab 10/21/17 1331  AST 34  ALT 24  ALKPHOS 135*  BILITOT 1.2  PROT 7.0  ALBUMIN 3.3*   No results for input(s): LIPASE, AMYLASE in the last 168 hours. No results for input(s): AMMONIA in the last 168 hours. Coagulation Profile: No results for input(s): INR, PROTIME in the last 168 hours. Cardiac Enzymes: No results for input(s): CKTOTAL, CKMB, CKMBINDEX,  TROPONINI in the last 168 hours. BNP (last 3 results) Recent Labs    05/25/17 1152  PROBNP 1,108*   HbA1C: No results for input(s): HGBA1C in the last 72 hours. CBG: Recent Labs  Lab 10/21/17 2156 10/22/17 0740 10/22/17 1123  GLUCAP 190* 187* 169*   Lipid Profile: No results for input(s): CHOL, HDL, LDLCALC, TRIG, CHOLHDL, LDLDIRECT in the last 72 hours. Thyroid Function Tests: No results for input(s): TSH, T4TOTAL, FREET4, T3FREE, THYROIDAB in the last 72 hours. Anemia Panel: No results for input(s): VITAMINB12, FOLATE, FERRITIN, TIBC, IRON, RETICCTPCT in the last 72 hours. Sepsis Labs: Recent Labs  Lab 10/21/17 1842  PROCALCITON 0.13    Recent Results (from the past 240 hour(s))  MRSA PCR Screening     Status: Abnormal   Collection Time: 10/21/17 10:46 PM  Result Value Ref Range Status   MRSA by PCR POSITIVE (A) NEGATIVE Final    Comment:        The GeneXpert MRSA Assay (FDA approved for NASAL specimens only), is one component of a comprehensive MRSA colonization surveillance program. It is not intended to diagnose MRSA infection nor to guide or monitor treatment for MRSA infections. RESULT CALLED TO, READ BACK BY AND VERIFIED WITH: J CVIJETIC RN 10/22/17 0349 JDW Performed at [facility] [address] [place], [place] [address]          Radiology Studies: Dg Chest 2 View  Result Date: 10/21/2017 CLINICAL DATA:  Failure to thrive.  Hypoxia. EXAM: CHEST - 2 VIEW COMPARISON:  03/02/2016 FINDINGS: Bilateral diffuse mild interstitial thickening. Prominence of the central pulmonary vasculature. No focal consolidation, pleural effusion or pneumothorax. Calcified pleural plaque along the left diaphragmatic surface. Stable cardiomegaly. Prior median sternotomy and mitral valve replacement. Cardiac pacemaker is present. No acute osseous abnormality. IMPRESSION: 1. Cardiomegaly with pulmonary vascular congestion. 2. Likely underlying mild chronic  interstitial lung disease. Electronically Signed   By: Kathreen Devoid   On: 10/21/2017 14:00   Ct Chest Wo Contrast  Result Date: 10/21/2017 CLINICAL DATA:  Decreased appetite.  Acute respiratory illness. EXAM: CT CHEST WITHOUT CONTRAST TECHNIQUE: Multidetector CT imaging of the chest was performed following the standard protocol without IV contrast. COMPARISON:  CT scan February 07, 2014.  Chest x-ray Oct 21, 2017. FINDINGS: Cardiovascular: Atherosclerotic changes in the nonaneurysmal aorta. The main pulmonary artery measures 3.2 cm. Cardiomegaly noted. Coronary artery calcifications identified. Mediastinum/Nodes: The thyroid and esophagus are normal. Stable shotty nodes in the mediastinum are likely reactive. Chest wall is normal. No effusions. Lungs/Pleura: Central airways are normal. No pneumothorax. Atelectasis in the left lung on axial image 42. Chronic changes in the left base. Focal infiltrate in the right base. Probable pulmonary venous congestion. Opacity in the right middle lobe may represent atelectasis versus infiltrate. No suspicious nodules or masses. Upper Abdomen: Cholelithiasis in  the gallbladder. The gallbladder wall is mildly prominent, possibly due to poor distention. Musculoskeletal: No chest wall mass or suspicious bone lesions identified. IMPRESSION: 1. Marked cardiomegaly and pulmonary venous congestion. 2. Focal infiltrate in the right base and perhaps the right middle lobe is suggestive of pneumonia or aspiration. Recommend follow-up to resolution. 3. Atherosclerotic changes in the nonaneurysmal aorta. Coronary artery calcifications. 4. The main pulmonary artery is mildly dilated measuring 3.2 cm raising the possibility of pulmonary arterial hypertension. 5. Shotty nodes in the mediastinum are likely reactive. 6. Cholelithiasis. The gallbladder wall is mildly prominent, possibly due to poor distention. Recommend clinical correlation. Aortic Atherosclerosis (ICD10-I70.0). Electronically  Signed   By: Dorise Bullion III M.D   On: 10/21/2017 17:53        Scheduled Meds: . apixaban  5 mg Oral BID  . atorvastatin  40 mg Oral QHS  . Chlorhexidine Gluconate Cloth  6 each Topical Q0600  . famotidine  20 mg Oral QHS  . furosemide  80 mg Intravenous Q12H  . glipiZIDE  5 mg Oral QAC breakfast  . insulin aspart  0-5 Units Subcutaneous QHS  . insulin aspart  0-9 Units Subcutaneous TID WC  . mouth rinse  15 mL Mouth Rinse BID  . multivitamin with minerals  1 tablet Oral Daily  . mupirocin ointment  1 application Nasal BID  . sodium chloride flush  3 mL Intravenous Q12H  . spironolactone  12.5 mg Oral Daily  . thiamine  100 mg Oral Daily   Continuous Infusions: . sodium chloride       LOS: 1 day     Georgette Shell, MD [name]  If 7PM-7AM, please contact night-coverage [email or web address] Password [email or web address] 10/22/2017, 1:17 PM

## 2017-10-22 NOTE — Consult Note (Addendum)
Cardiology Consultation:   Patient ID: [name]; [id number]; Dec 22, 1945   Admit date: 10/21/2017 Date of Consult: 10/22/2017  Primary Care Provider: Georgann Housekeeper, MD Primary Cardiologist: Dr. Shirlee Latch Primary Electrophysiologist:  Dr. Ladona Ridgel   Patient Profile:   [name] is a 72 y.o. 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 Jerolyn Center.  History of Present Illness:   [name] was sent in by his PCP given concerns for failure to thrive. He reports feeling increased shortness of breath recently with associated LE edema. He reports compliance with his medications and no significant changes to his weight. He denied chest pain. Patient was somnolent at the time of this interview and no family present to assist with history. Per H&P, he was without complaints of orthopnea or DOE.   He was last evaluated by Dr. Shirlee Latch 06/22/17 in follow-up of recent hospitalization for CHF exacerbation and was thought to be doing well for a CHF perspective. During his admission from 06/06/2017-06/13/2017 he underwent a R/LHC which revealed a chronically occluded RCA and 90% stenosis of left main. Case reviewed with Dr. Excell Seltzer who thought intervention would too be high risk given RCA blockage and PAD preventing impella use. He was not a CABG candidate due to severe COPD and was recommended for medical management. He was diuresed with IV lasix and discharged home on torsemide 60mg  daily + prn 20mg  torsemide for weight gain. His entresto was discontinued during this hospitalization due to hypotension.   Hospital course: VSS. Labs with electrolytes wnl, Cr 1.62 (baseline), Hgb 11.5, PLT  175, BNP 456, Trop 0.03 x1, Procal 0.13. CXR with pulmonary vascular congestion with underlying interstitial lung disease. CT Chest with RLL/RML PNA or aspiration. Patient was started on IV lasix 80mg  BID. Cardiology consulted for assistance with acute on chronic CHF.   Past Medical History:  Diagnosis Date  . AICD (automatic cardioverter/defibrillator) present   . Alcohol abuse   . Anxiety   . Bipolar disorder (HCC)   . Biventricular automatic implantable cardioverter defibrillator in situ    a. 01/2014 s/p MDT DTBA1D1 Viva XT CRT-D (ser # [id number]).  . CAD (coronary artery disease)    a. s/p prior PCI/stenting of the LCX;  b. 08/2014 MV: EF 20%, large septal, apical, and inferior infarct from apex to base, no ischemia;  c. 08/2014 NSTEMI/Cath: LM mod distal dzs extending into ostial LCX (80%), LAD tortuous, RI nl, OM mod dzs, RCA 100 CTO with R->R and L->R collats-->Med Rx.  . Chronic atrial fibrillation (HCC)    a. CHA2DS2VASc = 6->eliquis;  b. S/P AVN RFCA an BiV ICD placement.  . Chronic respiratory failure (HCC)   . Chronic systolic congestive heart failure (HCC)    a. 01/2014 Echo: EF 25-30%, mod conc LVH, mod dil LA.  . CKD (chronic kidney disease)    a. suspect stage II-III based on historical labs.  . Complete heart block (HCC)    a. In setting of prior AV nodal ablation r/t afib-->BiV ICD (01/2014).  . COPD (chronic obstructive pulmonary disease) (HCC)   . CVA (cerebral vascular accident) (HCC)    a. Multiple prior embolic strokes.  . Dementia   .  Depression   . DVT (deep venous thrombosis) (HCC)   . Dyslipidemia   . Hyperglycemia   . Hypertension   . Mitral valve disease    a. remote mitral replacement with Bjork Shiley valve;  b. 06/2006 Redo MVR with tissue valve.  . Mixed Ischemic and Nonischemic Cardiomyopathy    a. 8/.2015 Echo: Ef 25-30%;  b. 01/2014 s/p MDT [id number] Talbot Grumbling XT CRT-D (ser # [id number]).  . On home oxygen therapy    "2L; 24/7" (65/16/2019)  . Psoriasis    "back  of his head; elbows; finger of left hand" (06/07/2017)  . Ventral hernia     Past Surgical History:  Procedure Laterality Date  . AV NODE ABLATION  2007  . CARDIAC CATHETERIZATION  06/2006   Mild ostial L main stenosis, CFX stent patent, RCA occluded (old)  . CORONARY ARTERY BYPASS GRAFT  2008  . CYSTOSCOPY W/ URETERAL STENT PLACEMENT  07/12/2011   Procedure: CYSTOSCOPY WITH RETROGRADE PYELOGRAM/URETERAL STENT PLACEMENT;  Surgeon: Sebastian Ache, MD;  Location: [facility] OR;  Service: Urology;  Laterality: Left;  . HERNIA REPAIR    . ICD GENERATOR CHANGE  02/01/2014   Gen change to: Medtronic VIVA pulse generator, serial number [id number]  . INSERT / REPLACE / REMOVE PACEMAKER    . LEFT HEART CATHETERIZATION WITH CORONARY ANGIOGRAM N/A 08/23/2014   Procedure: LEFT HEART CATHETERIZATION WITH CORONARY ANGIOGRAM;  Surgeon: Corky Crafts, MD;  Location: [facility] CATH LAB;  Service: Cardiovascular;  Laterality: N/A;  . MITRAL VALVE REPLACEMENT     remote [facility] valve [id number] with redo tissue valve 06/2006  . PACEMAKER GENERATOR CHANGE N/A 02/01/2014   Procedure: PACEMAKER GENERATOR CHANGE;  Surgeon: Duke Salvia, MD;  Location: [facility] CATH LAB;  Service: Cardiovascular;  Laterality: N/A;  . PACEMAKER PLACEMENT  2006   Changed to CRT-D in 2009  . RIGHT/LEFT HEART CATH AND CORONARY ANGIOGRAPHY N/A 06/09/2017   Procedure: RIGHT/LEFT HEART CATH AND CORONARY ANGIOGRAPHY;  Surgeon: [name], MD;  Location: [facility] INVASIVE CV LAB;  Service: Cardiovascular;  Laterality: N/A;     Home Medications:  Prior to Admission medications   Medication Sig Start Date End Date Taking? Authorizing Provider  apixaban (ELIQUIS) 5 MG TABS tablet Take 1 tablet (5 mg total) by mouth 2 (two) times daily. 02/12/17  Yes Little Ishikawa, NP  atorvastatin (LIPITOR) 40 MG tablet Take 1 tablet (40 mg total) by mouth daily. Patient taking differently: Take 40 mg by mouth daily at 6 PM.  05/13/16  Yes Clegg, Amy D, NP  carvedilol  (COREG) 6.25 MG tablet Take 1 tablet (6.25 mg total) by mouth 2 (two) times daily with a meal. Patient taking differently: Take 6.25 mg by mouth daily.  06/13/17  Yes Rai, Ripudeep K, MD  glipiZIDE (GLUCOTROL) 5 MG tablet Take 5 mg daily before breakfast by mouth.   Yes [provider]  loratadine (CLARITIN) 10 MG tablet Take 1 tablet (10 mg total) by mouth daily. 06/14/17  Yes Rai, Ripudeep K, MD  losartan (COZAAR) 25 MG tablet Take 1 tablet (25 mg total) by mouth daily. 06/13/17  Yes Rai, Ripudeep K, MD  Multiple Vitamin (MULTIVITAMIN WITH MINERALS) TABS tablet Take 1 tablet by mouth daily. 05/24/15  Yes Osvaldo Shipper, MD  nitroGLYCERIN (NITROSTAT) 0.4 MG SL tablet Place 1 tablet (0.4 mg total) under the tongue every 5 (five) minutes as needed for chest pain. 01/06/15  Yes Robbie Lis M, PA-C  OXYGEN Inhale 2.5 L into the lungs  continuous.    Yes [provider]  spironolactone (ALDACTONE) 25 MG tablet Take 0.5 tablets (12.5 mg total) by mouth daily. Patient taking differently: Take 12.5 mg by mouth at bedtime.  01/29/17  Yes [name], MD  thiamine 100 MG tablet Take 1 tablet (100 mg total) by mouth daily. 05/24/15  Yes Osvaldo Shipper, MD  torsemide (DEMADEX) 20 MG tablet Take 3 tablets (60 mg total) by mouth daily. Take 60mg  (3tabs) daily. Take extra 20mg  (1 tab) in afternoon if weight up more than 20lbs. 06/14/17  Yes Rai, Ripudeep K, MD    Inpatient Medications: Scheduled Meds: . apixaban  5 mg Oral BID  . atorvastatin  40 mg Oral QHS  . Chlorhexidine Gluconate Cloth  6 each Topical Q0600  . famotidine  20 mg Oral QHS  . furosemide  80 mg Intravenous Q12H  . glipiZIDE  5 mg Oral QAC breakfast  . insulin aspart  0-5 Units Subcutaneous QHS  . insulin aspart  0-9 Units Subcutaneous TID WC  . mouth rinse  15 mL Mouth Rinse BID  . multivitamin with minerals  1 tablet Oral Daily  . mupirocin ointment  1 application Nasal BID  . sodium chloride flush  3 mL  Intravenous Q12H  . spironolactone  12.5 mg Oral Daily  . thiamine  100 mg Oral Daily   Continuous Infusions: . sodium chloride     PRN Meds: sodium chloride, acetaminophen, ipratropium-albuterol, ondansetron (ZOFRAN) IV, sodium chloride flush  Allergies:    Allergies  Allergen Reactions  . Warfarin Other (See Comments)    Non compliance and ETOH abuse    Social History:   Social History   Socioeconomic History  . Marital status: Divorced    Spouse name: Not on file  . Number of children: Not on file  . Years of education: Not on file  . Highest education level: Not on file  Occupational History  . Occupation: Retired Astronomer  . Financial resource strain: Not on file  . Food insecurity:    Worry: Not on file    Inability: Not on file  . Transportation needs:    Medical: Not on file    Non-medical: Not on file  Tobacco Use  . Smoking status: Current Every Day Smoker    Packs/day: 2.00    Years: 45.00    Pack years: 90.00    Types: Cigarettes  . Smokeless tobacco: Never Used  Substance and Sexual Activity  . Alcohol use: Yes    Alcohol/week: 12.6 oz    Types: 21 Cans of beer per week    Comment: 10/21/2017 "~ 2-3 beers/day"  . Drug use: No  . Sexual activity: Not Currently  Lifestyle  . Physical activity:    Days per week: Not on file    Minutes per session: Not on file  . Stress: Not on file  Relationships  . Social connections:    Talks on phone: Not on file    Gets together: Not on file    Attends religious service: Not on file    Active member of club or organization: Not on file    Attends meetings of clubs or organizations: Not on file    Relationship status: Not on file  . Intimate partner violence:    Fear of current or ex partner: Not on file    Emotionally abused: Not on file    Physically abused: Not on file    Forced sexual activity: Not  on file  Other Topics Concern  . Not on file  Social History Narrative   ** Merged  History Encounter **        Family History:    Family History  Problem Relation Age of Onset  . Alzheimer's disease Mother   . Heart attack Father   . Heart disease Father   . Heart attack Son   . Varicose Veins Son   . Deep vein thrombosis Son   . Heart disease Son   . Stroke Unknown   . Heart disease Unknown   . Heart disease Brother      ROS:  Please see the history of present illness.   All other ROS reviewed and negative.     Physical Exam/Data:   Vitals:   10/22/17 0529 10/22/17 0535 10/22/17 0905 10/22/17 1128  BP: 139/62  (!) 129/59 117/62  Pulse: (!) 59  (!) 59 60  Resp: 20  18 18   Temp: 97.6 F (36.4 C)  97.8 F (36.6 C)   TempSrc: Oral  Oral   SpO2: 97%  91% 94%  Weight:  165 lb 2 oz (74.9 kg)    Height:        Intake/Output Summary (Last 24 hours) at 10/22/2017 1631 Last data filed at 10/22/2017 0900 Gross per 24 hour  Intake 480 ml  Output 1150 ml  Net -670 ml   Filed Weights   10/21/17 1819 10/22/17 0535  Weight: 166 lb 6.4 oz (75.5 kg) 165 lb 2 oz (74.9 kg)   Body mass index is 28.34 kg/m.  General:  Disheveled elderly gentleman, laying in bed sleeping. Difficult to arouse  HEENT: sclera anicteric; poor dentition Neck: +JVD Vascular: distal pulses 2+ bilaterally Cardiac:  normal S1, S2; RRR; no murmur, gallops, or rubs appreciated Lungs:  Decreased breath sounds at lung bases with crackles throughout  Abd: NABS, soft, nontender, no hepatomegaly Ext: 1-2+ LE edema Musculoskeletal:  No deformities, BUE and BLE strength normal and equal Skin: warm and dry  Neuro:  CNs 2-12 intact, no focal abnormalities noted Psych:  Normal affect   EKG:  The EKG was personally reviewed and demonstrates:  Bi-ventricular paced Telemetry:  Telemetry was personally reviewed and demonstrates:  Bi-ventricular paced  Relevant CV Studies:  R/L Heart Catheterization 06/2017: Conclusion   1. Mild to moderate elevation in right and left heart filling pressures  with primarily pulmonary venous hypertension and relatively preserved cardiac output.  2. Proximal RCA occluded with collaterals.  This is chronic.  3. 90% distal left main stenosis.    Patient is not a good CABG candidate with 2 prior sternotomies, COPD on home oxygen, and mild dementia (as well as low EF).  PCI to left main would be feasible but difficult, would be concerned with jeopardizing the LCx.  Additionally, would undertake this procedure with Impella support and will need to make sure peripheral vasculature would be amenable to Impella placement (has history of significant PAD).    Will obtain aorto-iliac dopplers to assess iliacs/femorals for possible Impella.  Case discussed with Dr. Excell Seltzer and will review further for potential LM PCI.  Will give very gentle hydration post-cath and will give IV Lasix this evening.    Echocardiogram 03/2017: Study Conclusions  - Left ventricle: The cavity size was moderately dilated. There was   mild concentric hypertrophy. Systolic function was severely   reduced. The estimated ejection fraction was in the range of 25%   to 30%. Global hypokinesis worse in the anterolateral,  inferolateral, inferior, and inferoseptal myocardium. Features   are consistent with a pseudonormal left ventricular filling   pattern, with concomitant abnormal relaxation and increased   filling pressure (grade 2 diastolic dysfunction). Doppler   parameters are consistent with high ventricular filling pressure. - Aortic valve: Transvalvular velocity was within the normal range.   There was no stenosis. There was no regurgitation. - Mitral valve: A bioprosthesis was present. Transvalvular velocity   was within the normal range. There was no evidence for stenosis.   There was no regurgitation. - Left atrium: The atrium was moderately dilated. - Right ventricle: The cavity size was normal. Wall thickness was   normal. Systolic function was moderately reduced. -  Tricuspid valve: There was mild regurgitation. - Pulmonary arteries: Systolic pressure was within the normal   range. PA peak pressure: 27 mm Hg (S). - Pericardium, extracardiac: A trivial pericardial effusion was   identified.   Laboratory Data:  Chemistry Recent Labs  Lab 10/21/17 1331 10/22/17 0553  NA 140 140  K 4.0 4.2  CL 96* 92*  CO2 35* 38*  GLUCOSE 163* 208*  BUN 31* 38*  CREATININE 1.62* 1.73*  CALCIUM 8.9 8.9  GFRNONAA 41* 38*  GFRAA 48* 44*  ANIONGAP 9 10    Recent Labs  Lab 10/21/17 1331  PROT 7.0  ALBUMIN 3.3*  AST 34  ALT 24  ALKPHOS 135*  BILITOT 1.2   Hematology Recent Labs  Lab 10/21/17 1331  WBC 9.3  RBC 4.26  HGB 11.5*  HCT 39.8  MCV 93.4  MCH 27.0  MCHC 28.9*  RDW 16.0*  PLT 175   Cardiac EnzymesNo results for input(s): TROPONINI in the last 168 hours.  Recent Labs  Lab 10/21/17 1513  TROPIPOC 0.03    BNP Recent Labs  Lab 10/21/17 1842  BNP 456.6*    DDimer No results for input(s): DDIMER in the last 168 hours.  Radiology/Studies:  Dg Chest 2 View  Result Date: 10/21/2017 CLINICAL DATA:  Failure to thrive.  Hypoxia. EXAM: CHEST - 2 VIEW COMPARISON:  03/02/2016 FINDINGS: Bilateral diffuse mild interstitial thickening. Prominence of the central pulmonary vasculature. No focal consolidation, pleural effusion or pneumothorax. Calcified pleural plaque along the left diaphragmatic surface. Stable cardiomegaly. Prior median sternotomy and mitral valve replacement. Cardiac pacemaker is present. No acute osseous abnormality. IMPRESSION: 1. Cardiomegaly with pulmonary vascular congestion. 2. Likely underlying mild chronic interstitial lung disease. Electronically Signed   By: Elige Ko   On: 10/21/2017 14:00   Ct Chest Wo Contrast  Result Date: 10/21/2017 CLINICAL DATA:  Decreased appetite.  Acute respiratory illness. EXAM: CT CHEST WITHOUT CONTRAST TECHNIQUE: Multidetector CT imaging of the chest was performed following the  standard protocol without IV contrast. COMPARISON:  CT scan February 07, 2014.  Chest x-ray Oct 21, 2017. FINDINGS: Cardiovascular: Atherosclerotic changes in the nonaneurysmal aorta. The main pulmonary artery measures 3.2 cm. Cardiomegaly noted. Coronary artery calcifications identified. Mediastinum/Nodes: The thyroid and esophagus are normal. Stable shotty nodes in the mediastinum are likely reactive. Chest wall is normal. No effusions. Lungs/Pleura: Central airways are normal. No pneumothorax. Atelectasis in the left lung on axial image 42. Chronic changes in the left base. Focal infiltrate in the right base. Probable pulmonary venous congestion. Opacity in the right middle lobe may represent atelectasis versus infiltrate. No suspicious nodules or masses. Upper Abdomen: Cholelithiasis in the gallbladder. The gallbladder wall is mildly prominent, possibly due to poor distention. Musculoskeletal: No chest wall mass or suspicious bone lesions identified. IMPRESSION:  1. Marked cardiomegaly and pulmonary venous congestion. 2. Focal infiltrate in the right base and perhaps the right middle lobe is suggestive of pneumonia or aspiration. Recommend follow-up to resolution. 3. Atherosclerotic changes in the nonaneurysmal aorta. Coronary artery calcifications. 4. The main pulmonary artery is mildly dilated measuring 3.2 cm raising the possibility of pulmonary arterial hypertension. 5. Shotty nodes in the mediastinum are likely reactive. 6. Cholelithiasis. The gallbladder wall is mildly prominent, possibly due to poor distention. Recommend clinical correlation. Aortic Atherosclerosis (ICD10-I70.0). Electronically Signed   By: Gerome Sam III M.D   On: 10/21/2017 17:53    Assessment and Plan:   1. Acute on chronic diastolic CHF: patient was referred to [facility] ED for evaluation of failure to thrive with generalized weakness. He was noted to have increased his baseline O2. Found to have diffuse crackles and LE edema on  exam. BNP 456. CXR with pulmonary vascular congestion. Last Echo 03/2017 with EF 25-30%, G2DD, and global hypokinesis. Last admitted for CHF exacerbation 06/2017 at which time entresto was discontinued due to hypotension. Started on IV lasix 80mg  BID. UOP with - . Weight 166>165lbs. - Continue aggressive IV diuresis with lasix 80mg  BID - Will restart coreg at 3.125mg  BID - Will restart digoxin 0.125mg  daily - Continue to hold losartan to allow aggressive diuresis in patient with CKD - Continue to monitor strict I&Os and daily weights   2. Acute on chronic respiratory failure in patient with COPD on home O2: CXR with pulmonary vascular congestion with underlying interstitial lung disease. CT Chest with RLL/RML PNA or aspiration. Procal negative. Supportive care with nebulizers. Likely contributed to by #1 - Continue management per primary team  3. CAD s/p PCI/DES to LCx: without anginal complaints. Last [place] 06/2017 with chronically occluded RCA and 90% stenosis of left main. Case reviewed with Dr. Excell Seltzer who thought intervention would too be high risk given RCA blockage and PAD preventing impella use. Not a CABG candidate due to severe COPD. He was recommended for medical management.  - Continue statin; not on ASA given need for apixaban  4. Permanent Atrial fibrillation: s/p AV nodal ablation. EKG with biventricular pacing. PPM interrogated 10/20/17 with normal function and underlying atrial fibrillation. - Continue apixaban for CHADS2VASC score of 7 (HTN, DM, CHF, CAD, age >31, and CVA)  5. HTN: BP stable - Will restart coreg at lower dose as above  6. CKD stage III: Cr at or near baseline - Monitor closely with aggressive diuresis  7. DM type 2:  - Continue management per primary team  8. Mitral valve replacement: s/p mechanical valve replacement in 1983, with redo bioprosthetic in 2008.  9. Tobacco abuse: - Continue to encourage cessation  10: ETOH abuse: reportedly not  drinking - Continue to encourage abstinence    For questions or updates, please contact [facility] Please consult [email or web address] for contact info under Cardiology/STEMI.   Signed, Beatriz Stallion, PA-C  10/22/2017 4:31 PM [phone]  Patient seen with PA, agree with the above note. He was admitted with failure to thrive/volume overload.  He is not very interactive today but does report increased dyspnea at home.  Says he was taking his meds.  Continues to smoke.   On exam, JVP 14-16 cm.  Distant lung sounds.  Regular rhythm.    1. Acute on chronic systolic CHF:  ECHO 05/23/15 EF 20-25% Mixed ischemic/nonischemic (ETOH) cardiomyopathy.  S/p Medtronic CRT-D.  Echo (10/18) with EF 25-30%, moderately decreased RV systolic function.  He was admitted with  volume overload, remains volume overloaded today.   - Lasix 80 mg IV bid for now, follow response.   - Restart digoxin 0.125 daily.   - Can continue Coreg at lower dose 3.125 mg bid.   - Continue spironolactone 12.5 daily. - Hold losartan for now with diuresis and CKD.  2. Atrial fibrillation: Chronic, s/p AV nodal ablation.   - Continue Eliquis for anticoagulation. 3. PAD: No claudication symptoms.  - Follows at [facility].  4. CAD: No chest pain.  Severe LM stenosis found 1/19 => not a CABG candidate due to severe COPD and not PCI candidate due to inability to use Impella due to severe PAD (have reviewed extensively with interventional cardiology). - No ASA with stable CAD and need for Eliquis.  - Continue statin.  5. Mitral valve replacement: Bioprosthetic valve currently, originally Bjork-Shiley. Valve looked ok on 10/18 echo.  - Repeat echo this admission.  6. ETOH abuse: Says he has quit drinking.  7. Smoking/COPD: On home oxygen, severe. Still smoking some.  8. CKD: Stage 3.  Creatinine at baseline.  9. Pulmonary: CT chest with RLL/RML infiltrate but PCT not elevated and no fever/WBC elevation.  Possible aspiration.  Not on  antibiotics.   Marca Ancona 10/22/2017

## 2017-10-22 NOTE — Care Management Note (Signed)
Case Management Note  Patient Details  Name: [name] MRN: [id number] Date of Birth: 09/03/1945  Subjective/Objective:   CHF                Action/Plan: Patient lives at home; PCP: Georgann Housekeeper, MD; has private insurance with [facility] with prescription drug coverage; awaiting on Physical Therapy eval for disposition needs. CM will continue to follow for progression of care.   Expected Discharge Date:     Possibly 10/25/2017             Expected Discharge Plan:  Home w Home Health Services  Discharge planning Services  CM Consult  Status of Service:  In process, will continue to follow  Reola Mosher [phone] 10/22/2017, 2:46 PM

## 2017-10-22 NOTE — Progress Notes (Signed)
   10/22/17 1000  Clinical Encounter Type  Visited With Patient  Visit Type Initial;Spiritual support  Referral From Nurse  Consult/Referral To Chaplain  Spiritual Encounters  Spiritual Needs Prayer;Emotional   Responded to a SCC for prayer.  Patient was resting and seemed uncomfortable and was dozing on and off.  I was at bedside and asked if he wanted prayer, the response, always.  I knelt at bedside and prayed for him.  He seemed to be asleep at the end and resting.  Will follow and support as needed. Chaplain Agustin Cree

## 2017-10-23 ENCOUNTER — Inpatient Hospital Stay (HOSPITAL_COMMUNITY): Payer: Medicare Other

## 2017-10-23 DIAGNOSIS — I361 Nonrheumatic tricuspid (valve) insufficiency: Secondary | ICD-10-CM

## 2017-10-23 DIAGNOSIS — N183 Chronic kidney disease, stage 3 (moderate): Secondary | ICD-10-CM

## 2017-10-23 DIAGNOSIS — I2584 Coronary atherosclerosis due to calcified coronary lesion: Secondary | ICD-10-CM

## 2017-10-23 DIAGNOSIS — I2510 Atherosclerotic heart disease of native coronary artery without angina pectoris: Secondary | ICD-10-CM

## 2017-10-23 LAB — BASIC METABOLIC PANEL
Anion gap: 11 (ref 5–15)
BUN: 42 mg/dL — AB (ref 6–20)
CO2: 40 mmol/L — ABNORMAL HIGH (ref 22–32)
CREATININE: 1.77 mg/dL — AB (ref 0.61–1.24)
Calcium: 9.2 mg/dL (ref 8.9–10.3)
Chloride: 94 mmol/L — ABNORMAL LOW (ref 101–111)
GFR calc Af Amer: 43 mL/min — ABNORMAL LOW (ref >60)
GFR, EST NON AFRICAN AMERICAN: 37 mL/min — AB (ref >60)
GLUCOSE: 107 mg/dL — AB (ref 65–99)
Potassium: 4.4 mmol/L (ref 3.5–5.1)
SODIUM: 145 mmol/L (ref 135–145)

## 2017-10-23 LAB — ECHOCARDIOGRAM COMPLETE
HEIGHTINCHES: 64 in
WEIGHTICAEL: 2641 [oz_av]

## 2017-10-23 LAB — GLUCOSE, CAPILLARY
GLUCOSE-CAPILLARY: 93 mg/dL (ref 65–99)
GLUCOSE-CAPILLARY: 162 mg/dL — AB (ref 65–99)
GLUCOSE-CAPILLARY: 208 mg/dL — AB (ref 65–99)
Glucose-Capillary: 134 mg/dL — ABNORMAL HIGH (ref 65–99)

## 2017-10-23 LAB — MAGNESIUM: MAGNESIUM: 2.2 mg/dL (ref 1.7–2.4)

## 2017-10-23 NOTE — Evaluation (Addendum)
Physical Therapy Evaluation Patient Details Name: [name] MRN: [id number] DOB: July 01, 1945 Today's Date: 10/23/2017   History of Present Illness  73 y.o. male sent to ER for decreased appetite, increased fatigue and failure to thrive with generalized weakness. PMH includes: chronic combined HF, COPD, a fib, pacemaker placement, mitral valve replacement CAD, CVA, chronic respiratory failure, Dementia, on home oxygen,     Clinical Impression  Pt admitted with above diagnosis. Pt currently with functional limitations due to the deficits listed below (see PT Problem List). PTA pt living with daughter and son, independent with household ambulation. Patient is unreliable historian, AO to person and place only. Family reports that over 2 years patient has become sedentary and withdrawn, however over last 4-5 days he is now displaying differing cognition  with more confusion and are very concnered. Upon eval patient able to follow simple commands roughly 60% of the time, min guard for mobility including short distance ambulation without AD.  Family states they will never allow him to stay at SNF, and wish to take him home when medically stable. Patients BP remained stable throughout session, SpO2 de sats on RA to 80 quickly at rest, remains above 92% with activity on 3L Discussed with patients family the need for 24/7 supervision as he is a falls risk due to confusion and imbalance.  Pt will benefit from skilled PT to increase their independence and safety with mobility to allow discharge to the venue listed below.       Follow Up Recommendations Home health PT;Supervision/Assistance - 24 hour    Equipment Recommendations  (TBD)    Recommendations for Other Services OT consult     Precautions / Restrictions Precautions Precautions: Fall Restrictions Weight Bearing Restrictions: No      Mobility  Bed Mobility Overal bed mobility: Needs Assistance Bed Mobility: Supine to Sit     Supine  to sit: Supervision        Transfers Overall transfer level: Needs assistance Equipment used: None Transfers: Sit to/from Stand Sit to Stand: Supervision            Ambulation/Gait Ambulation/Gait assistance: Min guard Ambulation Distance (Feet): 30 Feet Assistive device: None Gait Pattern/deviations: Staggering right;Staggering left Gait velocity: decreased   General Gait Details: paitent with mild unsteadiness, able to ambulate on 3L short distance, min guard for safety   Stairs            Wheelchair Mobility    Modified Rankin (Stroke Patients Only)       Balance Overall balance assessment: Mild deficits observed, not formally tested                                           Pertinent Vitals/Pain Pain Assessment: No/denies pain    Home Living Family/patient expects to be discharged to:: Private residence Living Arrangements: Children;Other relatives Available Help at Discharge: Family Type of Home: House Home Access: Ramped entrance     Home Layout: One level Home Equipment: Walker - 2 wheels;Other (comment);Shower seat;Electric scooter      Prior Function Level of Independence: Independent with assistive device(s)         Comments: failure to thrive, lives with daughter. sleeping most of the day for 1.5 years.      Hand Dominance   Dominant Hand: Left    Extremity/Trunk Assessment   Upper Extremity Assessment Upper Extremity Assessment: Overall  WFL for tasks assessed    Lower Extremity Assessment Lower Extremity Assessment: Overall WFL for tasks assessed       Communication   Communication: HOH  Cognition Arousal/Alertness: Awake/alert Behavior During Therapy: Flat affect Overall Cognitive Status: Impaired/Different from baseline Area of Impairment: Following commands;Orientation;Attention;Memory;Awareness;Safety/judgement                 Orientation Level: Disoriented to;Time;Situation Current  Attention Level: Focused Memory: Decreased short-term memory Following Commands: Follows one step commands inconsistently Safety/Judgement: Decreased awareness of safety;Decreased awareness of deficits Awareness: Intellectual   General Comments: Patient AO to person and place only, believed it was decemeber and 1990.       General Comments General comments (skin integrity, edema, etc.): Extesnsive discussion with family over safe guarding and reducing risk of falls at home    Exercises     Assessment/Plan    PT Assessment Patient needs continued PT services  PT Problem List Decreased strength;Decreased range of motion;Decreased activity tolerance;Decreased balance;Decreased mobility;Decreased cognition       PT Treatment Interventions DME instruction;Gait training;Functional mobility training;Stair training;Therapeutic activities;Balance training;Therapeutic exercise;Cognitive remediation;Patient/family education    PT Goals (Current goals can be found in the Care Plan section)  Acute Rehab PT Goals Patient Stated Goal: non stated PT Goal Formulation: With patient/family Time For Goal Achievement: 10/30/17 Potential to Achieve Goals: Good    Frequency Min 3X/week   Barriers to discharge        Co-evaluation               AM-PAC PT "6 Clicks" Daily Activity  Outcome Measure Difficulty turning over in bed (including adjusting bedclothes, sheets and blankets)?: A Little Difficulty moving from lying on back to sitting on the side of the bed? : A Little Difficulty sitting down on and standing up from a chair with arms (e.g., wheelchair, bedside commode, etc,.)?: A Little Help needed moving to and from a bed to chair (including a wheelchair)?: A Little Help needed walking in hospital room?: A Little Help needed climbing 3-5 steps with a railing? : A Lot 6 Click Score: 17    End of Session Equipment Utilized During Treatment: Gait belt Activity Tolerance: Patient  limited by lethargy Patient left: in bed;with call bell/phone within reach;with bed alarm set;with nursing/sitter in room;with family/visitor present Nurse Communication: Mobility status PT Visit Diagnosis: Unsteadiness on feet (R26.81)    Time: 6256-3893 PT Time Calculation (min) (ACUTE ONLY): 46 min   Charges:   PT Evaluation $PT Eval Moderate Complexity: 1 Mod PT Treatments $Gait Training: 8-22 mins $Self Care/Home Management: 8-22   PT G Codes:        Etta Grandchild, PT, DPT Acute Rehab Services Pager: [phone]    Etta Grandchild 10/23/2017, 10:20 PM

## 2017-10-23 NOTE — Progress Notes (Signed)
  Echocardiogram 2D Echocardiogram has been performed.  Belva Chimes 10/23/2017, 4:10 PM

## 2017-10-23 NOTE — Progress Notes (Addendum)
Progress Note  Patient Name: [name] Date of Encounter: 10/23/2017  Primary Cardiologist: No primary care provider on file.   Subjective   Still complains of SOB but denies any chest pain.  BP stable at 121/43mmHg with O2 sats 96% on 2L.    Inpatient Medications    Scheduled Meds: . apixaban  5 mg Oral BID  . atorvastatin  40 mg Oral QHS  . carvedilol  3.125 mg Oral BID WC  . Chlorhexidine Gluconate Cloth  6 each Topical Q0600  . digoxin  0.125 mg Oral Daily  . famotidine  20 mg Oral QHS  . furosemide  80 mg Intravenous Q12H  . glipiZIDE  5 mg Oral QAC breakfast  . insulin aspart  0-5 Units Subcutaneous QHS  . insulin aspart  0-9 Units Subcutaneous TID WC  . mouth rinse  15 mL Mouth Rinse BID  . multivitamin with minerals  1 tablet Oral Daily  . mupirocin ointment  1 application Nasal BID  . sodium chloride flush  3 mL Intravenous Q12H  . spironolactone  12.5 mg Oral Daily  . thiamine  100 mg Oral Daily   Continuous Infusions: . sodium chloride     PRN Meds: sodium chloride, acetaminophen, ipratropium-albuterol, ondansetron (ZOFRAN) IV, sodium chloride flush   Vital Signs    Vitals:   10/23/17 0911 10/23/17 1228 10/23/17 1722 10/23/17 2021  BP:  128/62  132/65  Pulse: 66 (!) 59 68 (!) 59  Resp:  18  20  Temp:  98 F (36.7 C)  97.6 F (36.4 C)  TempSrc:  Oral  Oral  SpO2:  98%  94%  Weight:      Height:        Intake/Output Summary (Last 24 hours) at 10/23/2017 2217 Last data filed at 10/23/2017 1814 Gross per 24 hour  Intake 240 ml  Output 1900 ml  Net -1660 ml   Filed Weights   10/21/17 1819 10/22/17 0535 10/23/17 0544  Weight: 166 lb 6.4 oz (75.5 kg) 165 lb 2 oz (74.9 kg) 165 lb 1 oz (74.9 kg)    Telemetry    V paced with a short run of NSVT - Personally Reviewed  ECG    No new EKG to review - Personally Reviewed  Physical Exam   GEN: No acute distress.   Neck: No JVD Cardiac: RRR, no murmurs, rubs, or gallops.  Respiratory:  Clear to auscultation bilaterally. GI: Soft, nontender, non-distended  MS: No edema; No deformity. Neuro:  Nonfocal  Psych: Normal affect   Labs    Chemistry Recent Labs  Lab 10/21/17 1331 10/22/17 0553 10/23/17 0620  NA 140 140 145  K 4.0 4.2 4.4  CL 96* 92* 94*  CO2 35* 38* 40*  GLUCOSE 163* 208* 107*  BUN 31* 38* 42*  CREATININE 1.62* 1.73* 1.77*  CALCIUM 8.9 8.9 9.2  PROT 7.0  --   --   ALBUMIN 3.3*  --   --   AST 34  --   --   ALT 24  --   --   ALKPHOS 135*  --   --   BILITOT 1.2  --   --   GFRNONAA 41* 38* 37*  GFRAA 48* 44* 43*  ANIONGAP 9 10 11      Hematology Recent Labs  Lab 10/21/17 1331  WBC 9.3  RBC 4.26  HGB 11.5*  HCT 39.8  MCV 93.4  MCH 27.0  MCHC 28.9*  RDW 16.0*  PLT 175  Cardiac EnzymesNo results for input(s): TROPONINI in the last 168 hours.  Recent Labs  Lab 10/21/17 1513  TROPIPOC 0.03     BNP Recent Labs  Lab 10/21/17 1842  BNP 456.6*     DDimer No results for input(s): DDIMER in the last 168 hours.   Radiology    No results found.  Cardiac Studies   none  Patient Profile     72 y.o. 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 Acute on chronic diastolic CHF: patient was referred to [facility] ED for evaluation of failure to thrive with generalized weakness. He was noted to have increased his baseline O2. Found to have diffuse crackles and LE edema on exam. BNP 456. CXR with pulmonary vascular congestion. Last Echo 03/2017 with EF 25-30%, G2DD, and global hypokinesis. Last admitted for CHF exacerbation 06/2017 at which time entresto was discontinued due to hypotension.  - UOP 800cc yesterday and net  neg 1.3L - weight down 1lb from admit - he still has crackles and LE edema on exam - creatinine stable at 1.77 (1.73 on admit) - Continue lasix to 80mg  BID but if UOP does not pick up may have to increased the dose to promote better diuresis - Continuet coreg 3.125mg  BID and digoxin 0.125mg  daily - Continue to hold losartan to allow aggressive diuresis in patient with CKD - Continue to monitor strict I&Os and daily weights   2. Acute on chronic respiratory failure in patient with COPD on home O2: CXR with pulmonary vascular congestion with underlying interstitial lung disease. CT Chest with RLL/RML PNA or aspiration. Procal negative. Supportive care with nebulizers. Likely contributed to by #1 - Continue management per primary team  3. CAD s/p PCI/DES to LCx: without anginal complaints. Last [facility] 06/2017 with chronically occluded RCA and 90% stenosis of left main. Case reviewed with Dr. Excell Seltzer who thought intervention would too be high risk given RCA blockage and PAD preventing impella use. Not a CABG candidate due to severe COPD. He was recommended for medical management.  - Continue statin; not on ASA given need for apixaban  4. Permanent Atrial fibrillation: s/p AV nodal ablation. EKG with biventricular pacing. PPM interrogated 10/20/17 with normal function and underlying atrial fibrillation. - Continue apixaban for CHADS2VASC score of 7 (HTN, DM, CHF, CAD, age >71, and CVA)  5. HTN: BP controlled at 121/54mmHg.   - continue carvedilol 3.125mg  BID  6. CKD stage III: Cr at or near baseline - Monitor closely with aggressive diuresis  7. DM type 2:  - Continue management per primary team  8. Mitral valve replacement: s/p mechanical valve replacement in 1983, with redo bioprosthetic in 2008.  9. Tobacco abuse: - Continue to encourage cessation  10: ETOH abuse: reportedly not drinking - Continue to encourage abstinence   11.  Nonsustained ventricular tachycardia on tele - K  4.4 and mag 2.2 - 2D echo pending for LVF     For questions or updates, please contact [facility] Please consult [email or web address] for contact info under Cardiology/STEMI.      Signed, Armanda Magic, MD  10/23/2017, 10:17 PM

## 2017-10-23 NOTE — Progress Notes (Signed)
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 Patient also has O2 dependent COPD with ongoing tobacco abuse and history of heavy alcohol use although apparently has not had alcoholic beverages for over 2 years. He was also noted to have severe PAD. He follows in the outpatient setting with heart failure clinic. Patient was at a physician's office today and was sent to the ER for further evaluation. It was reported he had decreased appetite, increased fatigue and failure to thrive with generalized weakness all over. On 3 L oxygen at rest also oximetry was 94%, he was normotensive, CBG 246. Exam he was noted with diffuse posterior crackles. X-ray revealed cardiomegaly with pulmonary vascular congestion superimposed on mild chronic interstitial lung disease. In my discussion with the patient he denies orthopnea or dyspnea on exertion. He is noted with 2+ bilateral lower extremity edema. He does not know his baseline weight. He was unable to elucidate whether he had experienced dietary indiscretion. He reports using medications as prescribed. He is overall a poor historian. No family at bedside. In review of heart failure team records dry weight should be around 161 pounds. No weight has been obtained yet in the ER. BNP pending at time of my evaluation. EDP has treated as combination of COPD as well as heart failure exacerbation     Assessment & Plan:   Principal  Problem:   Acute on chronic combined systolic (congestive) and diastolic (congestive) heart failure (HCC) Active Problems:   Chronic hypoxemic respiratory failure (HCC)   Bipolar disorder (HCC)   Chronic atrial fibrillation (HCC)   History of CVA (cerebrovascular accident)   COPD (chronic obstructive pulmonary disease) (HCC)   Tobacco abuse   History of alcohol use   CKD (chronic kidney disease) stage 3, GFR 30-59 ml/min (HCC)   Biventricular automatic implantable cardioverter defibrillator in situ   Dementia    History of mitral valve replacement   Coronary artery disease   Situational depression   Grief reaction   Acute on chronic combined systolic (congestive) and diastolic (congestive) heart failure -Patient sent to ER from PCP after reports of failure to thrive, lethargy and ? Shortness of breath with reports of patient turning up home O2 from baseline of 3 L -Chest x-ray consistent with edema and noted with crackles on exam and bilateral lower extremity 2+ edema and JVD -Lasix 80 mg IV every 12 hours with home dose Aldactone -Hold home Cozaar in context of baseline renal insufficiency/CKD and desire to aggressively diurese -Hold home carvedilol secondary to suboptimal blood pressure and desire to aggressively diurese -Daily weights, strict I's/O -Echocardiogram from 2018: EF 25 to 30%, grade 2 diastolic dysfunction, moderate RV systolic dysfunction and mild TR -BNP 456 on admit. -Suspect may have a degree of dietary indiscretion Chronic hypoxemic respiratory failure2/2COPD (chronic obstructive pulmonary disease)with ongoingTobacco abuse -Patient reportedly increasing home O2 due to apparent issues with shortness of breath although patient denies orthopnea, dyspnea on exertion, cough, fevers chills or palpitations -Was given DuoNeb and steroids in ER but exam more consistent with heart failure -Continue oxygen -Not  on LABA or SABA prior to  admission -DuoNebprn -Patient reports smokes 2 packs of cigarettes per day CT CHESTMarked cardiomegaly and pulmonary venous congestion. 2. Focal infiltrate in the right base and perhaps the right middle lobe is suggestive of pneumonia or aspiration. Recommend follow-up to resolution. 3. Atherosclerotic changes in the nonaneurysmal aorta. Coronary artery calcifications. 4. The main pulmonary artery is mildly dilated measuring 3.2 cm raising the possibility of pulmonary arterial hypertension. 5. Shotty nodes in the mediastinum are likely reactive. 6. Cholelithiasis. The gallbladder wall is mildly prominent, possibly due to poor distention. Recommend clinical correlation.   HE IS NOT ON ANY ANTIBIOTICS AT THIS TIME AS HE HAS NO SYMPTOMS OF PNEUMONIA AND IS IMPROVING ON DIURETICS. Situational depression/grief reaction  -Patient likely severely depressed at the loss of his sister 3 to go with family reporting that the patient is sleeping all the time, he is not eating, he is not showering and is not taking his medications -Not on psychotropic medications prior to admission and may benefit from addition of SSRI   Chronic atrial fibrillation  -100% ventricular paced -Continue Eliquis and carvedilol -CHA2DS2-VASc=7 -Struve prior AV nodal ablation subsequent implantation of biventricular AICD/pacemaker  CKD (chronic kidney disease) stage 3, GFR 30-59 ml/min  -GFR stable and at baseline -Follow labs closely with diuresis, creatinine increased to 1.73 from 1.62 on IV diuresis.  Follow-up daily BMP.  History of mitral valve replacement  Coronary artery disease/CABG -Last cardiac catheterization January 2019 90% distal LM, chronic totally occludedpRCA. Not CABG candidate secondary to severe COPD. Severe PAD support Impella candidate therefore poor LMPCI candidate. -Medical management;continue carvedilol and Lipitor  Bipolar disorder/Dementia -Likely contributing to  patient's difficulty managing home care -Not on psychotropic medications prior to admission  History of CVA (cerebrovascular accident) -Eliquis and statin       DVT prophylaxis: eliquis Code Status: full Family Communication: Discussed with daughter [name] patient lives at home with her.  According to her his depression or not taking his medications have nothing to do with his sister's death recently.  He has been this way way before her death.  She has requested a psychiatric evaluation. Disposition Plan: tbd  Consultants:  [facility]  Procedures:none Antimicrobials: none  Subjective:resting in bed in nad   Objective: Vitals:   10/23/17 0544 10/23/17 0841 10/23/17 0911 10/23/17 1228  BP: 123/66 121/64  128/62  Pulse: 60 (!) 59 66 (!) 59  Resp: 20 18  18   Temp: 97.6 F (36.4 C) 98 F (36.7 C)    TempSrc: Oral Oral    SpO2: 94% 96%  98%  Weight: 74.9 kg (165 lb 1 oz)     Height:        Intake/Output Summary (Last 24 hours) at 10/23/2017 1423 Last data filed at 10/23/2017 0900 Gross per 24 hour  Intake 360 ml  Output 1000 ml  Net -640 ml   Filed Weights   10/21/17 1819 10/22/17 0535 10/23/17 0544  Weight: 75.5 kg (166 lb 6.4 oz) 74.9 kg (165 lb 2 oz) 74.9 kg (165 lb 1 oz)    Examination:  General exam: Appears calm and comfortable  Respiratory system: Crackles  to auscultation. Respiratory effort normal. Cardiovascular system: S1 & S2 heard, RRR. No JVD, murmurs, rubs, gallops or clicks. No pedal edema. Gastrointestinal system: Abdomen is nondistended, soft and nontender. No organomegaly or masses felt. Normal bowel sounds heard. Central nervous system: Alert and oriented. No focal neurological deficits. Extremities: Symmetric 5 x 5 power. Skin: No rashes,  lesions or ulcers Psychiatry: Judgement and insight appear normal. Mood & affect appropriate.     Data Reviewed: I have personally reviewed following labs and imaging studies  CBC: Recent Labs  Lab  10/21/17 1331  WBC 9.3  HGB 11.5*  HCT 39.8  MCV 93.4  PLT 175   Basic Metabolic Panel: Recent Labs  Lab 10/21/17 1331 10/22/17 0553 10/23/17 0620  NA 140 140 145  K 4.0 4.2 4.4  CL 96* 92* 94*  CO2 35* 38* 40*  GLUCOSE 163* 208* 107*  BUN 31* 38* 42*  CREATININE 1.62* 1.73* 1.77*  CALCIUM 8.9 8.9 9.2  MG  --   --  2.2   GFR: Estimated Creatinine Clearance: 36 mL/min (A) (by C-G formula based on SCr of 1.77 mg/dL (H)). Liver Function Tests: Recent Labs  Lab 10/21/17 1331  AST 34  ALT 24  ALKPHOS 135*  BILITOT 1.2  PROT 7.0  ALBUMIN 3.3*   No results for input(s): LIPASE, AMYLASE in the last 168 hours. No results for input(s): AMMONIA in the last 168 hours. Coagulation Profile: No results for input(s): INR, PROTIME in the last 168 hours. Cardiac Enzymes: No results for input(s): CKTOTAL, CKMB, CKMBINDEX, TROPONINI in the last 168 hours. BNP (last 3 results) Recent Labs    05/25/17 1152  PROBNP 1,108*   HbA1C: No results for input(s): HGBA1C in the last 72 hours. CBG: Recent Labs  Lab 10/22/17 1123 10/22/17 1704 10/22/17 2132 10/23/17 0733 10/23/17 1225  GLUCAP 169* 80 80 93 134*   Lipid Profile: No results for input(s): CHOL, HDL, LDLCALC, TRIG, CHOLHDL, LDLDIRECT in the last 72 hours. Thyroid Function Tests: No results for input(s): TSH, T4TOTAL, FREET4, T3FREE, THYROIDAB in the last 72 hours. Anemia Panel: No results for input(s): VITAMINB12, FOLATE, FERRITIN, TIBC, IRON, RETICCTPCT in the last 72 hours. Sepsis Labs: Recent Labs  Lab 10/21/17 1842  PROCALCITON 0.13    Recent Results (from the past 240 hour(s))  Respiratory Panel by PCR     Status: None   Collection Time: 10/21/17 10:40 PM  Result Value Ref Range Status   Adenovirus NOT DETECTED NOT DETECTED Final   Coronavirus 229E NOT DETECTED NOT DETECTED Final   Coronavirus HKU1 NOT DETECTED NOT DETECTED Final   Coronavirus NL63 NOT DETECTED NOT DETECTED Final   Coronavirus OC43  NOT DETECTED NOT DETECTED Final   Metapneumovirus NOT DETECTED NOT DETECTED Final   Rhinovirus / Enterovirus NOT DETECTED NOT DETECTED Final   Influenza A NOT DETECTED NOT DETECTED Final   Influenza B NOT DETECTED NOT DETECTED Final   Parainfluenza Virus 1 NOT DETECTED NOT DETECTED Final   Parainfluenza Virus 2 NOT DETECTED NOT DETECTED Final   Parainfluenza Virus 3 NOT DETECTED NOT DETECTED Final   Parainfluenza Virus 4 NOT DETECTED NOT DETECTED Final   Respiratory Syncytial Virus NOT DETECTED NOT DETECTED Final   Bordetella pertussis NOT DETECTED NOT DETECTED Final   Chlamydophila pneumoniae NOT DETECTED NOT DETECTED Final   Mycoplasma pneumoniae NOT DETECTED NOT DETECTED Final    Comment: Performed at [facility], [address] [place], [place] [address]  MRSA PCR Screening     Status: Abnormal   Collection Time: 10/21/17 10:46 PM  Result Value Ref Range Status   MRSA by PCR POSITIVE (A) NEGATIVE Final    Comment:        The GeneXpert MRSA Assay (FDA approved for NASAL specimens only), is one component of a comprehensive MRSA colonization surveillance program. It is  not intended to diagnose MRSA infection nor to guide or monitor treatment for MRSA infections. RESULT CALLED TO, READ BACK BY AND VERIFIED WITH: J CVIJETIC RN 10/22/17 0349 JDW Performed at [facility], [address], [place], [place] [address]          Radiology Studies: Ct Chest Wo Contrast  Result Date: 10/21/2017 CLINICAL DATA:  Decreased appetite.  Acute respiratory illness. EXAM: CT CHEST WITHOUT CONTRAST TECHNIQUE: Multidetector CT imaging of the chest was performed following the standard protocol without IV contrast. COMPARISON:  CT scan February 07, 2014.  Chest x-ray Oct 21, 2017. FINDINGS: Cardiovascular: Atherosclerotic changes in the nonaneurysmal aorta. The main pulmonary artery measures 3.2 cm. Cardiomegaly noted. Coronary artery calcifications identified. Mediastinum/Nodes: The  thyroid and esophagus are normal. Stable shotty nodes in the mediastinum are likely reactive. Chest wall is normal. No effusions. Lungs/Pleura: Central airways are normal. No pneumothorax. Atelectasis in the left lung on axial image 42. Chronic changes in the left base. Focal infiltrate in the right base. Probable pulmonary venous congestion. Opacity in the right middle lobe may represent atelectasis versus infiltrate. No suspicious nodules or masses. Upper Abdomen: Cholelithiasis in the gallbladder. The gallbladder wall is mildly prominent, possibly due to poor distention. Musculoskeletal: No chest wall mass or suspicious bone lesions identified. IMPRESSION: 1. Marked cardiomegaly and pulmonary venous congestion. 2. Focal infiltrate in the right base and perhaps the right middle lobe is suggestive of pneumonia or aspiration. Recommend follow-up to resolution. 3. Atherosclerotic changes in the nonaneurysmal aorta. Coronary artery calcifications. 4. The main pulmonary artery is mildly dilated measuring 3.2 cm raising the possibility of pulmonary arterial hypertension. 5. Shotty nodes in the mediastinum are likely reactive. 6. Cholelithiasis. The gallbladder wall is mildly prominent, possibly due to poor distention. Recommend clinical correlation. Aortic Atherosclerosis (ICD10-I70.0). Electronically Signed   By: Gerome Sam III M.D   On: 10/21/2017 17:53        Scheduled Meds: . apixaban  5 mg Oral BID  . atorvastatin  40 mg Oral QHS  . carvedilol  3.125 mg Oral BID WC  . Chlorhexidine Gluconate Cloth  6 each Topical Q0600  . digoxin  0.125 mg Oral Daily  . famotidine  20 mg Oral QHS  . furosemide  80 mg Intravenous Q12H  . glipiZIDE  5 mg Oral QAC breakfast  . insulin aspart  0-5 Units Subcutaneous QHS  . insulin aspart  0-9 Units Subcutaneous TID WC  . mouth rinse  15 mL Mouth Rinse BID  . multivitamin with minerals  1 tablet Oral Daily  . mupirocin ointment  1 application Nasal BID  .  sodium chloride flush  3 mL Intravenous Q12H  . spironolactone  12.5 mg Oral Daily  . thiamine  100 mg Oral Daily   Continuous Infusions: . sodium chloride       LOS: 2 days      Alwyn Ren, MD [name]  If 7PM-7AM, please contact night-coverage [email or web address] Password [facility] 10/23/2017, 2:23 PM

## 2017-10-24 DIAGNOSIS — I428 Other cardiomyopathies: Secondary | ICD-10-CM

## 2017-10-24 DIAGNOSIS — F3130 Bipolar disorder, current episode depressed, mild or moderate severity, unspecified: Secondary | ICD-10-CM

## 2017-10-24 DIAGNOSIS — F1010 Alcohol abuse, uncomplicated: Secondary | ICD-10-CM

## 2017-10-24 DIAGNOSIS — Z950 Presence of cardiac pacemaker: Secondary | ICD-10-CM

## 2017-10-24 DIAGNOSIS — I5042 Chronic combined systolic (congestive) and diastolic (congestive) heart failure: Secondary | ICD-10-CM

## 2017-10-24 DIAGNOSIS — R062 Wheezing: Secondary | ICD-10-CM

## 2017-10-24 DIAGNOSIS — F322 Major depressive disorder, single episode, severe without psychotic features: Secondary | ICD-10-CM

## 2017-10-24 DIAGNOSIS — M7989 Other specified soft tissue disorders: Secondary | ICD-10-CM

## 2017-10-24 DIAGNOSIS — Z810 Family history of intellectual disabilities: Secondary | ICD-10-CM

## 2017-10-24 DIAGNOSIS — R0602 Shortness of breath: Secondary | ICD-10-CM

## 2017-10-24 DIAGNOSIS — F17210 Nicotine dependence, cigarettes, uncomplicated: Secondary | ICD-10-CM

## 2017-10-24 DIAGNOSIS — Z9981 Dependence on supplemental oxygen: Secondary | ICD-10-CM

## 2017-10-24 DIAGNOSIS — Z7901 Long term (current) use of anticoagulants: Secondary | ICD-10-CM

## 2017-10-24 DIAGNOSIS — I255 Ischemic cardiomyopathy: Secondary | ICD-10-CM

## 2017-10-24 DIAGNOSIS — Z8249 Family history of ischemic heart disease and other diseases of the circulatory system: Secondary | ICD-10-CM

## 2017-10-24 DIAGNOSIS — Z9861 Coronary angioplasty status: Secondary | ICD-10-CM

## 2017-10-24 DIAGNOSIS — I4891 Unspecified atrial fibrillation: Secondary | ICD-10-CM

## 2017-10-24 LAB — BLOOD GAS, ARTERIAL
ACID-BASE EXCESS: 18.8 mmol/L — AB (ref 0.0–2.0)
Acid-Base Excess: 21.4 mmol/L — ABNORMAL HIGH (ref 0.0–2.0)
Bicarbonate: 46.1 mmol/L — ABNORMAL HIGH (ref 20.0–28.0)
Bicarbonate: 48 mmol/L — ABNORMAL HIGH (ref 20.0–28.0)
DRAWN BY: 441371
Delivery systems: POSITIVE
Drawn by: 301361
Expiratory PAP: 8
FIO2: 32
FIO2: 40
Inspiratory PAP: 16
LHR: 12 {breaths}/min
O2 SAT: 93 %
O2 SAT: 95.5 %
PATIENT TEMPERATURE: 99.1
PCO2 ART: 83.4 mmHg — AB (ref 32.0–48.0)
PO2 ART: 71.7 mmHg — AB (ref 83.0–108.0)
Patient temperature: 97.9
pCO2 arterial: 100 mmHg (ref 32.0–48.0)
pH, Arterial: 7.286 — ABNORMAL LOW (ref 7.350–7.450)
pH, Arterial: 7.376 (ref 7.350–7.450)
pO2, Arterial: 76.3 mmHg — ABNORMAL LOW (ref 83.0–108.0)

## 2017-10-24 LAB — GLUCOSE, CAPILLARY
GLUCOSE-CAPILLARY: 56 mg/dL — AB (ref 65–99)
GLUCOSE-CAPILLARY: 216 mg/dL — AB (ref 65–99)
Glucose-Capillary: 108 mg/dL — ABNORMAL HIGH (ref 65–99)
Glucose-Capillary: 122 mg/dL — ABNORMAL HIGH (ref 65–99)
Glucose-Capillary: 129 mg/dL — ABNORMAL HIGH (ref 65–99)
Glucose-Capillary: 178 mg/dL — ABNORMAL HIGH (ref 65–99)

## 2017-10-24 LAB — BASIC METABOLIC PANEL
Anion gap: 10 (ref 5–15)
BUN: 42 mg/dL — AB (ref 6–20)
CHLORIDE: 90 mmol/L — AB (ref 101–111)
CO2: 42 mmol/L — ABNORMAL HIGH (ref 22–32)
Calcium: 9.1 mg/dL (ref 8.9–10.3)
Creatinine, Ser: 1.79 mg/dL — ABNORMAL HIGH (ref 0.61–1.24)
GFR calc Af Amer: 43 mL/min — ABNORMAL LOW (ref >60)
GFR calc non Af Amer: 37 mL/min — ABNORMAL LOW (ref >60)
GLUCOSE: 103 mg/dL — AB (ref 65–99)
POTASSIUM: 4.4 mmol/L (ref 3.5–5.1)
SODIUM: 142 mmol/L (ref 135–145)

## 2017-10-24 MED ORDER — ACETAZOLAMIDE 250 MG PO TABS
250.0000 mg | ORAL_TABLET | Freq: Two times a day (BID) | ORAL | Status: AC
  Administered 2017-10-24 – 2017-10-25 (×2): 250 mg via ORAL
  Filled 2017-10-24 (×2): qty 1

## 2017-10-24 MED ORDER — LOSARTAN POTASSIUM 25 MG PO TABS
12.5000 mg | ORAL_TABLET | Freq: Every day | ORAL | Status: DC
  Administered 2017-10-24 – 2017-10-25 (×2): 12.5 mg via ORAL
  Filled 2017-10-24 (×3): qty 1

## 2017-10-24 MED ORDER — DEXTROSE 50 % IV SOLN
INTRAVENOUS | Status: AC
  Administered 2017-10-24: 50 mL via INTRAVENOUS
  Filled 2017-10-24: qty 50

## 2017-10-24 MED ORDER — ESCITALOPRAM OXALATE 10 MG PO TABS: 5.0000 mg | ORAL_TABLET | Freq: Every day | ORAL | Status: DC

## 2017-10-24 MED ORDER — DEXTROSE 50 % IV SOLN
1.0000 | Freq: Once | INTRAVENOUS | Status: AC
  Administered 2017-10-24: 50 mL via INTRAVENOUS

## 2017-10-24 MED ORDER — METOLAZONE 2.5 MG PO TABS
2.5000 mg | ORAL_TABLET | Freq: Once | ORAL | Status: AC
  Administered 2017-10-24: 2.5 mg via ORAL
  Filled 2017-10-24: qty 1

## 2017-10-24 NOTE — Progress Notes (Signed)
Pt with 18 bt run of Vtach. Pt asymptomatic vitals stable as follows 125/81 (61) sats 94% on bipap. MD paged and will await orders.

## 2017-10-24 NOTE — Progress Notes (Signed)
Critical ABG received. MD paged and states will transfer patient to step down. Rapid response made aware by respiratory.

## 2017-10-24 NOTE — Progress Notes (Addendum)
Patient ID: [name], male   DOB: 1946-05-29, 72 y.o.   MRN: [id number]                                                                PROGRESS NOTE                                                                                                                                                                                                             Patient Demographics:    [name], is a 72 y.o. male, DOB - [birth year], [name]:[id number]  Admit date - 10/21/2017   Admitting Physician Lahoma Crocker, MD  Outpatient Primary MD for the patient is Dominic Housekeeper, MD  LOS - 3  Outpatient Specialists:    Chief Complaint  Patient presents with  . Weakness       Brief Narrative  72 y.o. 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  Patient also has O2 dependent COPD with ongoing tobacco abuse and history of heavy alcohol use although apparently has not had alcoholic beverages for over 2 years.  He was also noted to have severe PAD.  He follows in the outpatient setting with heart failure clinic.  Patient was at a physician's office today and was sent to the ER for further evaluation.  It was reported he had decreased appetite, increased fatigue and failure to thrive with generalized weakness all over.  On 3 L oxygen at rest also oximetry was 94%, he was normotensive, CBG 246.  Exam he was noted with diffuse posterior crackles.  X-ray revealed cardiomegaly with pulmonary vascular congestion superimposed on mild chronic interstitial lung disease.  In my discussion with the patient he denies orthopnea or  dyspnea on exertion.  He is noted with 2+ bilateral lower extremity edema.  He does not know his baseline weight.  He was unable to elucidate whether he had experienced dietary indiscretion.  He reports using medications as prescribed.  He is overall a poor historian.  No family at bedside.  In review of heart failure team records dry weight should be around 161 pounds.  No weight has been obtained yet in the ER.  BNP pending at time of my evaluation.  EDP has treated as combination of COPD as well as heart failure exacerbation.  Records have become available from patient's primary care physician's office.  It is noted that the patient lost his sister 3 weeks ago.  He is sleeping all the time not eating and not taking his medications and not showering  ED Course:  Vital Signs: BP 114/65 (BP Location: Left Arm)   Pulse 60   Temp 97.7 F (36.5 C) (Oral)   Resp 16   SpO2 96%  Chest x-ray: As above Lab data: Sodium 140, potassium 4.0, chloride 96, CO2 35, glucose 163, BUN 31, creatinine 1.62, anion gap 9, alkaline phosphatase 135, albumin 3.3, LFTs normal, troponin normal, white count 9300 differential not obtained, hemoglobin 11.5, platelets 175,000 urinalysis unremarkable Medications and treatments: DuoNeb x1, prednisone 60 mg x 1     Subjective:    [name] today is eating breakfast.  States breathing ok. About 50% of normal ,but his hearing doesn't appear good.  Pt is able to answer questions. Pt is currently on o2 [place] Per RN ABG Co2=100, seems depressed.    No headache, No chest pain, No abdominal pain - No Nausea, No new weakness tingling or numbness, No Cough - SOB.    Assessment  & Plan :    Principal Problem:   Acute on chronic combined systolic (congestive) and diastolic (congestive) heart failure (HCC) Active Problems:   Chronic hypoxemic respiratory failure (HCC)   Bipolar disorder (HCC)   Chronic atrial fibrillation (HCC)   History of CVA (cerebrovascular accident)    COPD (chronic obstructive pulmonary disease) (HCC)   Tobacco abuse   History of alcohol use   CKD (chronic kidney disease) stage 3, GFR 30-59 ml/min (HCC)   Biventricular automatic implantable cardioverter defibrillator in situ   Dementia    History of mitral valve replacement   Coronary artery disease   Situational depression   Grief reaction   Acute Resp Failure with Hypercapnia Transfer to Stepdown Bipap initiated Try to use less o2 [place] when weaned off Bipap   Acute on chronic combined systolic (congestive) and diastolic (congestive) heart failure PER PRIOR PHYSICIAN -Patient sent to ER from PCP after reports of failure to thrive, lethargy and ? Shortness of breath with reports of patient turning up home O2 from baseline of 3 L -Chest x-ray consistent with edema and noted with crackles on exam and bilateral lower extremity 2+ edema and JVD -BNP456 on admit. -Cardiac echo 5/18=> EF 30-35%, mod pulmonary hypertension -Lasix 80 mg IV every 12 hours  -Cont Spironolactone 12.5mg  po qday -ContCarvedilol at lower dose 3.125mg  po bid -metolazone 2.5mg  po x1 today and then bid x 2 doses -Hold home Cozaar in context of baseline renal insufficiency/CKD and desire to aggressively diurese Appreciate cardiology input  Chronic hypoxemic respiratory failure2/2COPD (chronic obstructive pulmonary disease)with ongoingTobacco abuse -Patient reportedly increasing home O2 due to apparent issues with shortness of breath although patient denies orthopnea, dyspnea on exertion, cough, fevers chills or palpitations -Was given DuoNeb and steroids in ER but exam more consistent with heart failure -Continue oxygen -Not on LABA or SABA prior to admission -DuoNebprn -Patient reports smokes 2 packs of cigarettes per day CT CHESTMarked cardiomegaly and pulmonary venous congestion. 2. Focal infiltrate in the right base and  perhaps the right middle lobe is suggestive of pneumonia or aspiration.  Recommend follow-up to resolution. 3. Atherosclerotic changes in the nonaneurysmal aorta. Coronary artery calcifications. 4. The main pulmonary artery is mildly dilated measuring 3.2 cm raising the possibility of pulmonary arterial hypertension. 5. Shotty nodes in the mediastinum are likely reactive. 6. Cholelithiasis. The gallbladder wall is mildly prominent, possibly due to poor distention. Recommend clinical correlation. Per prior physician HE IS NOT ON ANY ANTIBIOTICS AT THIS TIME AS HE HAS NO SYMPTOMS OF PNEUMONIA AND IS IMPROVING ON DIURETICS.   Situational depression/grief reaction  -Patient likely severely depressed at the loss of his sister 3 to go with family reporting that the patient is sleeping all the time, he is not eating, he is not showering and is not taking his medications -Not on psychotropic medications prior to admission and may benefit from addition of SSRI Psychiatry consult  Chronic atrial fibrillation  -100% ventricular paced -Continue Eliquis and carvedilol -CHA2DS2-VASc=7 -Struve prior AV nodal ablation subsequent implantation of biventricular AICD/pacemaker  CKD (chronic kidney disease) stage 3, GFR 30-59 ml/min  -GFR stable and at baseline -Follow labs closely with diuresis,creatinine increased to 1.73 from 1.62 on IV diuresis. Follow-up daily BMP.  History of mitral valve replacement  Coronary artery disease/CABG -Last cardiac catheterization January 2019 90% distal LM, chronic totally occludedpRCA. Not CABG candidate secondary to severe COPD. Severe PAD support Impella candidate therefore poor LMPCI candidate. -Medical management;continue carvedilol and Lipitor  Bipolar disorder/Dementia -Likely contributing to patient's difficulty managing home care -Not on psychotropic medications prior to admission Psychiatry consult,   History of CVA (cerebrovascular accident) Cont Eliquis and statin  Dm2 Cont Glipizide Fsbs ac and  qhs,         Code Status : FULL CODE  Family Communication  : w son, prior physician apparently spoke with [name] regarding depression due to his sisters death. She requested a psych evaluation, none appears to have been performed, I attempted to page psychiatry today.  Disposition Plan  : home ?  Barriers For Discharge :   Consults  :  Cardiology , psychiatry  Procedures  :   DVT Prophylaxis  :  Eliquis  Lab Results  Component Value Date   PLT 175 10/21/2017    Antibiotics  :    Anti-infectives (From admission, onward)   None        Objective:   Vitals:   10/23/17 1228 10/23/17 1722 10/23/17 2021 10/24/17 0500  BP: 128/62  132/65 135/68  Pulse: (!) 59 68 (!) 59 60  Resp: 18  20 20   Temp: 98 F (36.7 C)  97.6 F (36.4 C) (!) 97.4 F (36.3 C)  TempSrc: Oral  Oral Oral  SpO2: 98%  94% 95%  Weight:    74.6 kg (164 lb 6.4 oz)  Height:        Wt Readings from Last 3 Encounters:  10/24/17 74.6 kg (164 lb 6.4 oz)  08/03/17 76.4 kg (168 lb 6.9 oz)  06/22/17 79.9 kg (176 lb 3.2 oz)     Intake/Output Summary (Last 24 hours) at 10/24/2017 0727 Last data filed at 10/24/2017 0600 Gross per 24 hour  Intake 440 ml  Output 1925 ml  Net -1485 ml     Physical Exam  Awake Alert, Oriented X 3, No new F.N deficits, Normal affect [place].AT,PERRAL Supple Neck, slight JVD, No cervical lymphadenopathy appriciated.  Symmetrical Chest wall movement, Good air movement bilaterally, slight bibasilar crackles, no wheezing RRR,No Gallops,Rubs or new Murmurs,  No Parasternal Heave +ve B.Sounds, Abd Soft, No tenderness, No organomegaly appriciated, No rebound - guarding or rigidity. No Cyanosis, Clubbing or edema, No new Rash or bruise      Data Review:    CBC Recent Labs  Lab 10/21/17 1331  WBC 9.3  HGB 11.5*  HCT 39.8  PLT 175  MCV 93.4  MCH 27.0  MCHC 28.9*  RDW 16.0*    Chemistries  Recent Labs  Lab 10/21/17 1331 10/22/17 0553 10/23/17 0620  NA 140  140 145  K 4.0 4.2 4.4  CL 96* 92* 94*  CO2 35* 38* 40*  GLUCOSE 163* 208* 107*  BUN 31* 38* 42*  CREATININE 1.62* 1.73* 1.77*  CALCIUM 8.9 8.9 9.2  MG  --   --  2.2  AST 34  --   --   ALT 24  --   --   ALKPHOS 135*  --   --   BILITOT 1.2  --   --    ------------------------------------------------------------------------------------------------------------------ No results for input(s): CHOL, HDL, LDLCALC, TRIG, CHOLHDL, LDLDIRECT in the last 72 hours.  Lab Results  Component Value Date   HGBA1C 7.8 (H) 02/12/2017   ------------------------------------------------------------------------------------------------------------------ No results for input(s): TSH, T4TOTAL, T3FREE, THYROIDAB in the last 72 hours.  Invalid input(s): FREET3 ------------------------------------------------------------------------------------------------------------------ No results for input(s): VITAMINB12, FOLATE, FERRITIN, TIBC, IRON, RETICCTPCT in the last 72 hours.  Coagulation profile No results for input(s): INR, PROTIME in the last 168 hours.  No results for input(s): DDIMER in the last 72 hours.  Cardiac Enzymes No results for input(s): CKMB, TROPONINI, MYOGLOBIN in the last 168 hours.  Invalid input(s): CK ------------------------------------------------------------------------------------------------------------------    Component Value Date/Time   BNP 456.6 (H) 10/21/2017 1842    Inpatient Medications  Scheduled Meds: . apixaban  5 mg Oral BID  . atorvastatin  40 mg Oral QHS  . carvedilol  3.125 mg Oral BID WC  . Chlorhexidine Gluconate Cloth  6 each Topical Q0600  . digoxin  0.125 mg Oral Daily  . famotidine  20 mg Oral QHS  . furosemide  80 mg Intravenous Q12H  . glipiZIDE  5 mg Oral QAC breakfast  . insulin aspart  0-5 Units Subcutaneous QHS  . insulin aspart  0-9 Units Subcutaneous TID WC  . mouth rinse  15 mL Mouth Rinse BID  . multivitamin with minerals  1 tablet Oral  Daily  . mupirocin ointment  1 application Nasal BID  . sodium chloride flush  3 mL Intravenous Q12H  . spironolactone  12.5 mg Oral Daily  . thiamine  100 mg Oral Daily   Continuous Infusions: . sodium chloride     PRN Meds:.sodium chloride, acetaminophen, ipratropium-albuterol, ondansetron (ZOFRAN) IV, sodium chloride flush  Micro Results Recent Results (from the past 240 hour(s))  Respiratory Panel by PCR     Status: None   Collection Time: 10/21/17 10:40 PM  Result Value Ref Range Status   Adenovirus NOT DETECTED NOT DETECTED Final   Coronavirus 229E NOT DETECTED NOT DETECTED Final   Coronavirus HKU1 NOT DETECTED NOT DETECTED Final   Coronavirus NL63 NOT DETECTED NOT DETECTED Final   Coronavirus OC43 NOT DETECTED NOT DETECTED Final   Metapneumovirus NOT DETECTED NOT DETECTED Final   Rhinovirus / Enterovirus NOT DETECTED NOT DETECTED Final   Influenza A NOT DETECTED NOT DETECTED Final   Influenza B NOT DETECTED NOT DETECTED Final   Parainfluenza Virus 1 NOT DETECTED NOT DETECTED Final   Parainfluenza Virus 2 NOT DETECTED NOT DETECTED  Final   Parainfluenza Virus 3 NOT DETECTED NOT DETECTED Final   Parainfluenza Virus 4 NOT DETECTED NOT DETECTED Final   Respiratory Syncytial Virus NOT DETECTED NOT DETECTED Final   Bordetella pertussis NOT DETECTED NOT DETECTED Final   Chlamydophila pneumoniae NOT DETECTED NOT DETECTED Final   Mycoplasma pneumoniae NOT DETECTED NOT DETECTED Final    Comment: Performed at [facility], [address] [place], [place] [address]  MRSA PCR Screening     Status: Abnormal   Collection Time: 10/21/17 10:46 PM  Result Value Ref Range Status   MRSA by PCR POSITIVE (A) NEGATIVE Final    Comment:        The GeneXpert MRSA Assay (FDA approved for NASAL specimens only), is one component of a comprehensive MRSA colonization surveillance program. It is not intended to diagnose MRSA infection nor to guide or monitor treatment for MRSA  infections. RESULT CALLED TO, READ BACK BY AND VERIFIED WITH: J CVIJETIC RN 10/22/17 0349 JDW Performed at [facility], [address] [place], [place] [address]     Radiology Reports Dg Chest 2 View  Result Date: 10/21/2017 CLINICAL DATA:  Failure to thrive.  Hypoxia. EXAM: CHEST - 2 VIEW COMPARISON:  03/02/2016 FINDINGS: Bilateral diffuse mild interstitial thickening. Prominence of the central pulmonary vasculature. No focal consolidation, pleural effusion or pneumothorax. Calcified pleural plaque along the left diaphragmatic surface. Stable cardiomegaly. Prior median sternotomy and mitral valve replacement. Cardiac pacemaker is present. No acute osseous abnormality. IMPRESSION: 1. Cardiomegaly with pulmonary vascular congestion. 2. Likely underlying mild chronic interstitial lung disease. Electronically Signed   By: Elige Ko   On: 10/21/2017 14:00   Ct Chest Wo Contrast  Result Date: 10/21/2017 CLINICAL DATA:  Decreased appetite.  Acute respiratory illness. EXAM: CT CHEST WITHOUT CONTRAST TECHNIQUE: Multidetector CT imaging of the chest was performed following the standard protocol without IV contrast. COMPARISON:  CT scan February 07, 2014.  Chest x-ray Oct 21, 2017. FINDINGS: Cardiovascular: Atherosclerotic changes in the nonaneurysmal aorta. The main pulmonary artery measures 3.2 cm. Cardiomegaly noted. Coronary artery calcifications identified. Mediastinum/Nodes: The thyroid and esophagus are normal. Stable shotty nodes in the mediastinum are likely reactive. Chest wall is normal. No effusions. Lungs/Pleura: Central airways are normal. No pneumothorax. Atelectasis in the left lung on axial image 42. Chronic changes in the left base. Focal infiltrate in the right base. Probable pulmonary venous congestion. Opacity in the right middle lobe may represent atelectasis versus infiltrate. No suspicious nodules or masses. Upper Abdomen: Cholelithiasis in the gallbladder. The gallbladder wall  is mildly prominent, possibly due to poor distention. Musculoskeletal: No chest wall mass or suspicious bone lesions identified. IMPRESSION: 1. Marked cardiomegaly and pulmonary venous congestion. 2. Focal infiltrate in the right base and perhaps the right middle lobe is suggestive of pneumonia or aspiration. Recommend follow-up to resolution. 3. Atherosclerotic changes in the nonaneurysmal aorta. Coronary artery calcifications. 4. The main pulmonary artery is mildly dilated measuring 3.2 cm raising the possibility of pulmonary arterial hypertension. 5. Shotty nodes in the mediastinum are likely reactive. 6. Cholelithiasis. The gallbladder wall is mildly prominent, possibly due to poor distention. Recommend clinical correlation. Aortic Atherosclerosis (ICD10-I70.0). Electronically Signed   By: Gerome Sam III M.D   On: 10/21/2017 17:53    Time Spent in minutes  30   Pearson Grippe M.D on 10/24/2017 at 7:27 AM  Between 7am to 7pm - Pager - [phone]  After 7pm go to [email or web address] - password [email or web address]  [name] -  Office  [phone]

## 2017-10-24 NOTE — Progress Notes (Signed)
Rec'd pt from [facility]. Pt tx to SDU s/p ABG indicating a cO2 of 100. Pt lethargic upon arrival. RT present. Placed pt on BipAP. Currently pt tolerating well.Skin assessed  by this RN and Sam CN. No compromised noted. Lungs decreased throughout. BLE edema noted.. continue present plan a of care

## 2017-10-24 NOTE — Consult Note (Signed)
[facility] Consult   Reason for Consult:  Depression Referring Physician:  Dr.Husain Patient Identification: [name] MRN:  [id number] Principal Diagnosis: Acute on chronic combined systolic (congestive) and diastolic (congestive) heart failure ([place]) Diagnosis:   Patient Active Problem List   Diagnosis Date Noted  . Acute on chronic combined systolic (congestive) and diastolic (congestive) heart failure ([place]) [I50.43] 10/21/2017  . Chronic hypoxemic respiratory failure ([place]) [J96.11] 10/21/2017  . Bipolar disorder ([place]) [F31.9] 10/21/2017  . Chronic atrial fibrillation ([place]) [I48.2] 10/21/2017  . History of CVA (cerebrovascular accident) [Z86.73] 10/21/2017  . COPD (chronic obstructive pulmonary disease) ([place]) [J44.9] 10/21/2017  . Tobacco abuse [Z72.0] 10/21/2017  . History of alcohol use [Z87.898] 10/21/2017  . CKD (chronic kidney disease) stage 3, GFR 30-59 ml/min (HCC) [N18.3] 10/21/2017  . Biventricular automatic implantable cardioverter defibrillator in situ [Z95.810] 10/21/2017  . Dementia  [F02.80] 10/21/2017  . History of mitral valve replacement [Z95.2] 10/21/2017  . Coronary artery disease [I25.10] 10/21/2017  . Situational depression [F43.21] 10/21/2017  . Grief reaction [F43.21] 10/21/2017  . Chronic respiratory failure with hypoxia and hypercapnia (HCC) [J96.11, J96.12] 06/22/2017  . CHF exacerbation ([place]) [I50.9] 06/06/2017  . Acute respiratory failure with hypoxia and hypercapnia (HCC) [J96.01, J96.02]   . COPD GOLD 0 still smoking  [J44.9] 03/02/2016  . Chronic kidney disease (CKD), stage II (mild) [N18.2] 11/23/2015  . CAD (coronary artery disease) [I25.10]   . Dementia [F03.90]   . Chronic respiratory failure with hypoxia ([place]) [J96.11] 10/11/2015  . Chronic systolic CHF (congestive heart failure) ([place]) [I50.22] 05/30/2015  . Cigarette smoker [F17.210] 12/24/2014  . Stroke [facility]) [I63.9]   . Mitral valve disease [I05.9]   . Depression  [F32.9]   . 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 Alcohol abuse [F10.10]   . Chronic anticoagulation [Z79.01] 08/22/2014  . PVD (peripheral vascular disease) ([place]) [I73.9] 02/28/2014  . Mural thrombus of heart [I51.3] 02/07/2014  . Hypertension [I10] 06/14/2012  . Automatic implantable cardioverter-defibrillator in situ [Z95.810] 04/04/2012  . Hyperlipidemia [E78.5] 10/27/2011  . Ventricular tachycardia ([place]) [I47.2] 07/14/2011  . Atrial fibrillation, chronic ([place]) [I48.2] 07/13/2011  . Right atrial thrombus ([place]) [I51.3] 07/11/2011    Total Time spent with patient: 45 minutes  Subjective:   [name] is a 72 y.o. male patient admitted with depression.  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 Patient also has O2 dependent COPD with ongoing tobacco abuse and history of heavy alcohol use although apparently has not had alcoholic beverages for over 2 years. He was also noted to have severe PAD. Patient was at a physician's office with decreased appetite, increased fatigue and failure to thrive with generalized weakness all over.   Evaluation today: Patient was appeared with a CPAP mission and his daughter and granddaughter was at bedside.  Patient was not able to participate in this evaluation today but he appeared awake and able to breathe appropriately but not able to communicate.  Patient daughter stated that he has been diagnosed with bipolar disorder in the past and believes he may have some  depression and requested  evaluation but at the same time he is medical condition is most important than psychiatric condition at this time requested to defer the evaluation for the later date as patient was not able to contribute at this time.    Past Psychiatric History: Bipolar disorder, recent history of depression.  Risk to Self: Is patient at risk for suicide?: No Risk to Others:   Prior Inpatient Therapy:   Prior Outpatient Therapy:    Past Medical History:  Past Medical History:  Diagnosis Date  . AICD (automatic cardioverter/defibrillator) present   . Alcohol abuse   . Anxiety   . Bipolar disorder ([place])   . Biventricular automatic implantable cardioverter defibrillator in situ    a. 01/2014 s/p MDT [id number] Viva XT CRT-D (ser # [id number]).  . CAD (coronary artery disease)    a. s/p prior PCI/stenting of the LCX;  b. 08/2014 MV: EF 20%, large septal, apical, and inferior infarct from apex to base, no ischemia;  c. 08/2014 NSTEMI/Cath: LM mod distal dzs extending into ostial LCX (80%), LAD tortuous, RI nl, OM mod dzs, RCA 100 CTO with R->R and L->R collats-->Med Rx.  . Chronic atrial fibrillation (HCC)    a. CHA2DS2VASc = 6->eliquis;  b. S/P AVN RFCA an BiV ICD placement.  . Chronic respiratory failure ([place])   . Chronic systolic congestive heart failure ([place])    a. 01/2014 Echo: EF 25-30%, mod conc LVH, mod dil LA.  . CKD (chronic kidney disease)    a. suspect stage II-III based on historical labs.  . Complete heart block ([place])    a. In setting of prior AV nodal ablation r/t afib-->BiV ICD (01/2014).  . COPD (chronic obstructive pulmonary disease) ([place])   . CVA (cerebral vascular accident) ([place])    a. Multiple prior embolic strokes.  . Dementia   . Depression   . DVT (deep venous thrombosis) ([place])   . Dyslipidemia   . Hyperglycemia   . Hypertension   . Mitral valve disease    a. remote mitral replacement with Bjork Shiley valve;  b. 06/2006 Redo MVR with tissue valve.  . Mixed  Ischemic and Nonischemic Cardiomyopathy    a. 8/.2015 Echo: Ef 25-30%;  b. 01/2014 s/p MDT [id number] Pershing Proud XT CRT-D (ser # [id number]).  . On home oxygen therapy    "2L; 24/7" (65/16/2019)  . Psoriasis    "back of his head; elbows; finger of left hand" (06/07/2017)  . Ventral hernia     Past Surgical History:  Procedure Laterality Date  . AV NODE ABLATION  2007  . CARDIAC CATHETERIZATION  06/2006   Mild ostial L main stenosis, CFX stent patent, RCA occluded (old)  . CORONARY ARTERY BYPASS GRAFT  2008  . CYSTOSCOPY W/ URETERAL STENT PLACEMENT  07/12/2011   Procedure: CYSTOSCOPY WITH RETROGRADE PYELOGRAM/URETERAL STENT PLACEMENT;  Surgeon: Alexis Frock, MD;  Location: [facility];  Service: Urology;  Laterality: Left;  . HERNIA REPAIR    . ICD GENERATOR CHANGE  02/01/2014   Gen change to: Medtronic VIVA pulse generator, serial number [id number]  . INSERT / REPLACE / REMOVE PACEMAKER    . LEFT HEART CATHETERIZATION WITH CORONARY ANGIOGRAM N/A 08/23/2014   Procedure: LEFT HEART CATHETERIZATION WITH CORONARY ANGIOGRAM;  Surgeon: Jettie Booze, MD;  Location[facility] CATH LAB;  Service: Cardiovascular;  Laterality: N/A;  . MITRAL VALVE REPLACEMENT     remote [facility] valve 5885 with redo tissue valve 06/2006  . PACEMAKER GENERATOR CHANGE N/A 02/01/2014   Procedure: PACEMAKER GENERATOR  CHANGE;  Surgeon: Deboraha Sprang, MD;  Location: [facility] CATH LAB;  Service: Cardiovascular;  Laterality: N/A;  . PACEMAKER PLACEMENT  2006   Changed to CRT-D in 2009  . RIGHT/LEFT HEART CATH AND CORONARY ANGIOGRAPHY N/A 06/09/2017   Procedure: RIGHT/LEFT HEART CATH AND CORONARY ANGIOGRAPHY;  Surgeon: Larey Dresser, MD;  Location: [facility] CV LAB;  Service: Cardiovascular;  Laterality: N/A;   Family History:  Family History  Problem Relation Age of Onset  . Alzheimer's disease Mother   . Heart attack Father   . Heart disease Father   . Heart attack Son   . Varicose Veins Son   . Deep vein thrombosis Son   . Heart  disease Son   . Stroke Unknown   . Heart disease Unknown   . Heart disease Brother    Family Psychiatric  History: Denied family history of mental illness. Social History:  Social History   Substance and Sexual Activity  Alcohol Use Yes  . Alcohol/week: 12.6 oz  . Types: 21 Cans of beer per week   Comment: 10/21/2017 "~ 2-3 beers/day"     Social History   Substance and Sexual Activity  Drug Use No    Social History   Socioeconomic History  . Marital status: Divorced    Spouse name: Not on file  . Number of children: Not on file  . Years of education: Not on file  . Highest education level: Not on file  Occupational History  . Occupation: Retired Administrator  . Financial resource strain: Not on file  . Food insecurity:    Worry: Not on file    Inability: Not on file  . Transportation needs:    Medical: Not on file    Non-medical: Not on file  Tobacco Use  . Smoking status: Current Every Day Smoker    Packs/day: 2.00    Years: 45.00    Pack years: 90.00    Types: Cigarettes  . Smokeless tobacco: Never Used  Substance and Sexual Activity  . Alcohol use: Yes    Alcohol/week: 12.6 oz    Types: 21 Cans of beer per week    Comment: 10/21/2017 "~ 2-3 beers/day"  . Drug use: No  . Sexual activity: Not Currently  Lifestyle  . Physical activity:    Days per week: Not on file    Minutes per session: Not on file  . Stress: Not on file  Relationships  . Social connections:    Talks on phone: Not on file    Gets together: Not on file    Attends religious service: Not on file    Active member of club or organization: Not on file    Attends meetings of clubs or organizations: Not on file    Relationship status: Not on file  Other Topics Concern  . Not on file  Social History Narrative   ** Merged History Encounter **       Additional Social History:    Allergies:   Allergies  Allergen Reactions  . Warfarin Other (See Comments)    Non compliance  and ETOH abuse    Labs:  Results for orders placed or performed during the hospital encounter of 10/21/17 (from the past 48 hour(s))  Glucose, capillary     Status: None   Collection Time: 10/22/17  5:04 PM  Result Value Ref Range   Glucose-Capillary 80 65 - 99 mg/dL   Comment 1 Notify RN    Comment  2 Document in Chart   Glucose, capillary     Status: None   Collection Time: 10/22/17  9:32 PM  Result Value Ref Range   Glucose-Capillary 80 65 - 99 mg/dL   Comment 1 Notify RN    Comment 2 Document in Chart   Basic metabolic panel     Status: Abnormal   Collection Time: 10/23/17  6:20 AM  Result Value Ref Range   Sodium 145 135 - 145 mmol/L   Potassium 4.4 3.5 - 5.1 mmol/L   Chloride 94 (L) 101 - 111 mmol/L   CO2 40 (H) 22 - 32 mmol/L   Glucose, Bld 107 (H) 65 - 99 mg/dL   BUN 42 (H) 6 - 20 mg/dL   Creatinine, Ser 1.77 (H) 0.61 - 1.24 mg/dL   Calcium 9.2 8.9 - 10.3 mg/dL   GFR calc non Af Amer 37 (L) >60 mL/min   GFR calc Af Amer 43 (L) >60 mL/min    Comment: (NOTE) The eGFR has been calculated using the CKD EPI equation. This calculation has not been validated in all clinical situations. eGFR's persistently <60 mL/min signify possible Chronic Kidney Disease.    Anion gap 11 5 - 15    Comment: Performed at [facility] [address], [place], [place] [address]  Magnesium     Status: None   Collection Time: 10/23/17  6:20 AM  Result Value Ref Range   Magnesium 2.2 1.7 - 2.4 mg/dL    Comment: Performed at [facility] [address], [place], [place] [address]  Glucose, capillary     Status: None   Collection Time: 10/23/17  7:33 AM  Result Value Ref Range   Glucose-Capillary 93 65 - 99 mg/dL   Comment 1 Notify RN    Comment 2 Document in Chart   Glucose, capillary     Status: Abnormal   Collection Time: 10/23/17 12:25 PM  Result Value Ref Range   Glucose-Capillary 134 (H) 65 - 99 mg/dL   Comment 1 Notify RN    Comment 2 Document in Chart   Glucose,  capillary     Status: Abnormal   Collection Time: 10/23/17  5:06 PM  Result Value Ref Range   Glucose-Capillary 162 (H) 65 - 99 mg/dL   Comment 1 Notify RN    Comment 2 Document in Chart   Glucose, capillary     Status: Abnormal   Collection Time: 10/23/17  9:13 PM  Result Value Ref Range   Glucose-Capillary 208 (H) 65 - 99 mg/dL   Comment 1 Notify RN    Comment 2 Document in Chart   Basic metabolic panel     Status: Abnormal   Collection Time: 10/24/17  7:17 AM  Result Value Ref Range   Sodium 142 135 - 145 mmol/L   Potassium 4.4 3.5 - 5.1 mmol/L   Chloride 90 (L) 101 - 111 mmol/L   CO2 42 (H) 22 - 32 mmol/L   Glucose, Bld 103 (H) 65 - 99 mg/dL   BUN 42 (H) 6 - 20 mg/dL   Creatinine, Ser 1.79 (H) 0.61 - 1.24 mg/dL   Calcium 9.1 8.9 - 10.3 mg/dL   GFR calc non Af Amer 37 (L) >60 mL/min   GFR calc Af Amer 43 (L) >60 mL/min    Comment: (NOTE) The eGFR has been calculated using the CKD EPI equation. This calculation has not been validated in all clinical situations. eGFR's persistently <60 mL/min signify possible  Chronic Kidney Disease.    Anion gap 10 5 - 15    Comment: Performed at [facility] [address] [place], [place] [address]  Glucose, capillary     Status: Abnormal   Collection Time: 10/24/17  7:22 AM  Result Value Ref Range   Glucose-Capillary 108 (H) 65 - 99 mg/dL   Comment 1 Notify RN    Comment 2 Document in Chart   Blood gas, arterial     Status: Abnormal   Collection Time: 10/24/17 11:00 AM  Result Value Ref Range   FIO2 32.00    Delivery systems NASAL CANNULA    pH, Arterial 7.286 (L) 7.350 - 7.450   pCO2 arterial 100 (HH) 32.0 - 48.0 mmHg    Comment: CRITICAL RESULT CALLED TO, READ BACK BY AND VERIFIED WITH: Clide Cliff, RN AT 11:13 BY [place], RRT ON 10/24/17.    pO2, Arterial 71.7 (L) 83.0 - 108.0 mmHg   Bicarbonate 46.1 (H) 20.0 - 28.0 mmol/L   Acid-Base Excess 18.8 (H) 0.0 - 2.0 mmol/L   O2 Saturation 93.0 %   Patient  temperature 99.1    Collection site RIGHT RADIAL    Drawn by [id number]    Sample type ARTERIAL DRAW    Allens test (pass/fail) PASS PASS  Glucose, capillary     Status: Abnormal   Collection Time: 10/24/17 11:58 AM  Result Value Ref Range   Glucose-Capillary 178 (H) 65 - 99 mg/dL    Current Facility-Administered Medications  Medication Dose Route Frequency Provider Last Rate Last Dose  . 0.9 %  sodium chloride infusion  250 mL Intravenous PRN Samella Parr, NP      . acetaminophen (TYLENOL) tablet 650 mg  650 mg Oral Q4H PRN Samella Parr, NP      . acetaZOLAMIDE (DIAMOX) tablet 250 mg  250 mg Oral BID Larey Dresser, MD   250 mg at 10/24/17 1001  . apixaban (ELIQUIS) tablet 5 mg  5 mg Oral BID Samella Parr, NP   5 mg at 10/24/17 1001  . atorvastatin (LIPITOR) tablet 40 mg  40 mg Oral QHS Samella Parr, NP   40 mg at 10/23/17 2117  . carvedilol (COREG) tablet 3.125 mg  3.125 mg Oral BID WC Kroeger, Krista M., PA-C   3.125 mg at 10/24/17 1423  . Chlorhexidine Gluconate Cloth 2 % PADS 6 each  6 each Topical Q0600 Lady Deutscher, MD   6 each at 10/24/17 0602  . digoxin (LANOXIN) tablet 0.125 mg  0.125 mg Oral Daily Kroeger, Krista M., PA-C   0.125 mg at 10/24/17 1001  . famotidine (PEPCID) tablet 20 mg  20 mg Oral QHS Samella Parr, NP   20 mg at 10/23/17 2122  . furosemide (LASIX) injection 80 mg  80 mg Intravenous Q12H Samella Parr, NP   80 mg at 10/24/17 0649  . glipiZIDE (GLUCOTROL) tablet 5 mg  5 mg Oral QAC breakfast Samella Parr, NP   5 mg at 10/24/17 1001  . insulin aspart (novoLOG) injection 0-5 Units  0-5 Units Subcutaneous QHS Samella Parr, NP   2 Units at 10/23/17 2124  . insulin aspart (novoLOG) injection 0-9 Units  0-9 Units Subcutaneous TID WC Samella Parr, NP   2 Units at 10/24/17 1357  . ipratropium-albuterol (DUONEB) 0.5-2.5 (3) MG/3ML nebulizer solution 3 mL  3 mL Nebulization Q4H PRN Samella Parr, NP      . losartan (  COZAAR) tablet  12.5 mg  12.5 mg Oral Daily Larey Dresser, MD   12.5 mg at 10/24/17 1001  . MEDLINE mouth rinse  15 mL Mouth Rinse BID Lady Deutscher, MD   15 mL at 10/24/17 1249  . multivitamin with minerals tablet 1 tablet  1 tablet Oral Daily Samella Parr, NP   1 tablet at 10/24/17 1001  . mupirocin ointment (BACTROBAN) 2 % 1 application  1 application Nasal BID Lady Deutscher, MD   1 application at 59/45/85 1001  . ondansetron (ZOFRAN) injection 4 mg  4 mg Intravenous Q6H PRN Erin Hearing L, NP      . sodium chloride flush (NS) 0.9 % injection 3 mL  3 mL Intravenous Q12H Samella Parr, NP   3 mL at 10/24/17 1249  . sodium chloride flush (NS) 0.9 % injection 3 mL  3 mL Intravenous PRN Samella Parr, NP      . spironolactone (ALDACTONE) tablet 12.5 mg  12.5 mg Oral Daily Erin Hearing L, NP   12.5 mg at 10/24/17 1005  . thiamine (VITAMIN B-1) tablet 100 mg  100 mg Oral Daily Samella Parr, NP   100 mg at 10/24/17 1001    Musculoskeletal: Strength & Muscle Tone: decreased Gait & Station: unable to stand Patient leans: N/A  Psychiatric Specialty Exam: Physical Exam as per history and physical  Review of Systems  Constitutional: Negative.   HENT: Negative.   Eyes: Negative.   Respiratory: Positive for wheezing.   Cardiovascular: Positive for orthopnea and leg swelling.  Gastrointestinal: Negative.   Genitourinary: Negative.   Musculoskeletal: Negative.   Skin: Negative.   Neurological: Negative.   Endo/Heme/Allergies: Negative.   Psychiatric/Behavioral: Positive for depression.    Blood pressure 133/60, pulse 60, temperature 97.6 F (36.4 C), temperature source Axillary, resp. rate (!) 30, height '5\' 4"'  (1.626 m), weight 74.6 kg (164 lb 6.4 oz), SpO2 94 %.Body mass index is 28.22 kg/m.  General Appearance: Guarded  Eye Contact:  Fair  Speech:  NA  Volume:  NA  Mood:  Depressed  Affect:  Flat  Thought Process:  NA  Orientation:  NA  Thought Content:  NA  Suicidal  Thoughts:  No  Homicidal Thoughts:  No  Memory:  NA  Judgement:  NA  Insight:  NA  Psychomotor Activity:  Decreased  Concentration:  Concentration: NA and Attention Span: NA  Recall:  NA  Fund of Knowledge:  NA  Language:  NA  Akathisia:  NA  Handed:  Right  AIMS (if indicated):     Assets:  Others:  unable to assess  ADL's:  Impaired  Cognition:  Impaired,  Mild  Sleep:        Treatment Plan Summary: Daily contact with patient to assess and evaluate symptoms and progress in treatment and Medication management  Disposition: Please contact the psychiatric consultation when patient is medically Stable and able to contribute for psychiatric evaluaiton.  Ambrose Finland, MD 10/24/2017 2:07 PM

## 2017-10-24 NOTE — Progress Notes (Signed)
Patient ID: [name], male   DOB: [birth year], 72 y.o.   MRN: [id number]     Advanced Heart Failure Rounding Note  PCP-Cardiologist: No primary care provider on file.   Subjective:    He is lethargic today.  Per family, confused when awake. HCO3 higher at 42.  Some diuresis yesterday but not marked, weight down 1 lb.    Objective:   Weight Range: 164 lb 6.4 oz (74.6 kg) Body mass index is 28.22 kg/m.   Vital Signs:   Temp:  [97.4 F (36.3 C)-98 F (36.7 C)] 97.4 F (36.3 C) (05/19 0500) Pulse Rate:  [59-68] 60 (05/19 0500) Resp:  [18-20] 20 (05/19 0500) BP: (128-135)/(62-68) 135/68 (05/19 0500) SpO2:  [94 %-98 %] 95 % (05/19 0500) Weight:  [164 lb 6.4 oz (74.6 kg)] 164 lb 6.4 oz (74.6 kg) (05/19 0500) Last BM Date: 10/21/17  Weight change: Filed Weights   10/22/17 0535 10/23/17 0544 10/24/17 0500  Weight: 165 lb 2 oz (74.9 kg) 165 lb 1 oz (74.9 kg) 164 lb 6.4 oz (74.6 kg)    Intake/Output:   Intake/Output Summary (Last 24 hours) at 10/24/2017 0936 Last data filed at 10/24/2017 0928 Gross per 24 hour  Intake 680 ml  Output 1425 ml  Net -745 ml      Physical Exam    General:  NAD, sleepy HEENT: Normal Neck: Supple. JVP 14+. Cor: PMI nondisplaced. Regular rate & rhythm. No rubs, gallops or murmurs. Lungs: Clear Abdomen: Soft, nontender, nondistended. No hepatosplenomegaly. No bruits or masses. Good bowel sounds. Extremities: No cyanosis, clubbing, rash, trace ankle edema Neuro: Awakens and follows commands.  Some confusion.    Telemetry   Atrial fibrillation/BiV pacing (personally reviewed)   Labs    CBC Recent Labs    10/21/17 1331  WBC 9.3  HGB 11.5*  HCT 39.8  MCV 93.4  PLT 175   Basic Metabolic Panel Recent Labs    03/83/33 0620 10/24/17 0717  NA 145 142  K 4.4 4.4  CL 94* 90*  CO2 40* 42*  GLUCOSE 107* 103*  BUN 42* 42*  CREATININE 1.77* 1.79*  CALCIUM 9.2 9.1  MG 2.2  --    Liver Function Tests Recent Labs     10/21/17 1331  AST 34  ALT 24  ALKPHOS 135*  BILITOT 1.2  PROT 7.0  ALBUMIN 3.3*   No results for input(s): LIPASE, AMYLASE in the last 72 hours. Cardiac Enzymes No results for input(s): CKTOTAL, CKMB, CKMBINDEX, TROPONINI in the last 72 hours.  BNP: BNP (last 3 results) Recent Labs    02/01/17 1054 06/06/17 1037 10/21/17 1842  BNP 102.6* 486.4* 456.6*    ProBNP (last 3 results) Recent Labs    05/25/17 1152  PROBNP 1,108*     D-Dimer No results for input(s): DDIMER in the last 72 hours. Hemoglobin A1C No results for input(s): HGBA1C in the last 72 hours. Fasting Lipid Panel No results for input(s): CHOL, HDL, LDLCALC, TRIG, CHOLHDL, LDLDIRECT in the last 72 hours. Thyroid Function Tests No results for input(s): TSH, T4TOTAL, T3FREE, THYROIDAB in the last 72 hours.  Invalid input(s): FREET3  Other results:   Imaging     No results found.   Medications:     Scheduled Medications: . apixaban  5 mg Oral BID  . atorvastatin  40 mg Oral QHS  . carvedilol  3.125 mg Oral BID WC  . Chlorhexidine Gluconate Cloth  6 each Topical Q0600  . digoxin  0.125  mg Oral Daily  . famotidine  20 mg Oral QHS  . furosemide  80 mg Intravenous Q12H  . glipiZIDE  5 mg Oral QAC breakfast  . insulin aspart  0-5 Units Subcutaneous QHS  . insulin aspart  0-9 Units Subcutaneous TID WC  . losartan  12.5 mg Oral Daily  . mouth rinse  15 mL Mouth Rinse BID  . metolazone  2.5 mg Oral Once  . multivitamin with minerals  1 tablet Oral Daily  . mupirocin ointment  1 application Nasal BID  . sodium chloride flush  3 mL Intravenous Q12H  . spironolactone  12.5 mg Oral Daily  . thiamine  100 mg Oral Daily     Infusions: . sodium chloride       PRN Medications:  sodium chloride, acetaminophen, ipratropium-albuterol, ondansetron (ZOFRAN) IV, sodium chloride flush    Assessment/Plan   1. Acute on chronic systolic CHF: ECHO 05/23/15 EF 20-25% Mixed ischemic/nonischemic  (ETOH) cardiomyopathy. S/p Medtronic CRT-D. Echo this admission with EF 30-35%. He was admitted with volume overload, remains volume overloaded today.  Has diuresed some but not markedly.  Has history of COPD, HCO3 rising with diuresis.  - Lasix 80 mg IV bid, will give dose of metolazone 2.5 today.  - Acetazolamide 250 mg bid x 2 doses.  - Continue digoxin 0.125 daily.   -Can continue Coreg at lower dose 3.125 mg bid.  -Continue spironolactone 12.5 daily. - Creatinine stable, losartan 12.5 mg daily ordered.   2. Atrial fibrillation:Chronic,s/p AV nodal ablation. - Continue Eliquis for anticoagulation. 3. PAD:No claudication symptoms.  - Follows at [facility].  4. CAD:No chest pain. Severe LM stenosis found 1/19 =>not a CABG candidate due to severe COPD and not PCI candidate due to inability to use Impella due to severe PAD (have reviewed extensively with interventional cardiology). - No ASA with stable CAD and need for Eliquis.  - Continue statin.  5. Mitral valve replacement:Bioprosthetic valve currently, originally Bjork-Shiley. Valve looked ok onecho this admission.  6. ETOH abuse:Says he has quit drinking. 7. Smoking/COPD:On home oxygen, severe. Still smoking some.  8. CKD: Stage 3.  Creatinine at baseline.  9. Pulmonary: CT chest with RLL/RML infiltrate but PCT not elevated and no fever/WBC elevation.  Possible aspiration.  Not on antibiotics.  10. Neuro: Baseline bipolar disorder and dementia.  Family says he "sleeps all the time" at home.  Suspect hypercarbia in setting of severe COPD.  Delirium in the hospital.  - Will check ABG.   Length of Stay: 3  Marca Ancona, MD  10/24/2017, 9:36 AM  Advanced Heart Failure Team Pager [phone] (M-F; 7a - 4p)  Please contact [facility] for night-coverage after hours (4p -7a ) and weekends on [email or web address]

## 2017-10-24 NOTE — Progress Notes (Signed)
Pt with hypoglycemic event of CBG dropping to 56. Pt NPO for Bipap use, and asymptomatic of hypoglycemic event. 1 ampule of d50 given and CBG improved to 216. Will continue to monitor.

## 2017-10-24 NOTE — Progress Notes (Signed)
Critical ABG results reported off to Verl Dicker, RN on 10/24/17 at 11:13. CO2 is 100. However, patient is still responding at this time.

## 2017-10-25 ENCOUNTER — Ambulatory Visit (HOSPITAL_COMMUNITY): Payer: Medicare Other

## 2017-10-25 DIAGNOSIS — J9600 Acute respiratory failure, unspecified whether with hypoxia or hypercapnia: Secondary | ICD-10-CM

## 2017-10-25 DIAGNOSIS — Z95810 Presence of automatic (implantable) cardiac defibrillator: Secondary | ICD-10-CM

## 2017-10-25 DIAGNOSIS — J9601 Acute respiratory failure with hypoxia: Secondary | ICD-10-CM

## 2017-10-25 DIAGNOSIS — J9602 Acute respiratory failure with hypercapnia: Secondary | ICD-10-CM

## 2017-10-25 DIAGNOSIS — I5043 Acute on chronic combined systolic (congestive) and diastolic (congestive) heart failure: Secondary | ICD-10-CM

## 2017-10-25 DIAGNOSIS — I509 Heart failure, unspecified: Secondary | ICD-10-CM

## 2017-10-25 LAB — BLOOD GAS, ARTERIAL
Acid-Base Excess: 22.3 mmol/L — ABNORMAL HIGH (ref 0.0–2.0)
Bicarbonate: 48.4 mmol/L — ABNORMAL HIGH (ref 20.0–28.0)
DRAWN BY: 398981
Delivery systems: POSITIVE
EXPIRATORY PAP: 8
FIO2: 40
Inspiratory PAP: 16
LHR: 12 {breaths}/min
O2 SAT: 96.5 %
PCO2 ART: 73.6 mmHg — AB (ref 32.0–48.0)
PH ART: 7.433 (ref 7.350–7.450)
PO2 ART: 83.2 mmHg (ref 83.0–108.0)
Patient temperature: 98.6

## 2017-10-25 LAB — CBC
HCT: 44.1 % (ref 39.0–52.0)
Hemoglobin: 12.7 g/dL — ABNORMAL LOW (ref 13.0–17.0)
MCH: 26.5 pg (ref 26.0–34.0)
MCHC: 28.8 g/dL — AB (ref 30.0–36.0)
MCV: 91.9 fL (ref 78.0–100.0)
Platelets: 198 10*3/uL (ref 150–400)
RBC: 4.8 MIL/uL (ref 4.22–5.81)
RDW: 16.1 % — ABNORMAL HIGH (ref 11.5–15.5)
WBC: 12.3 10*3/uL — ABNORMAL HIGH (ref 4.0–10.5)

## 2017-10-25 LAB — GLUCOSE, CAPILLARY
Glucose-Capillary: 116 mg/dL — ABNORMAL HIGH (ref 65–99)
Glucose-Capillary: 287 mg/dL — ABNORMAL HIGH (ref 65–99)
Glucose-Capillary: 142 mg/dL — ABNORMAL HIGH (ref 65–99)
Glucose-Capillary: 181 mg/dL — ABNORMAL HIGH (ref 65–99)

## 2017-10-25 LAB — COMPREHENSIVE METABOLIC PANEL
ALBUMIN: 3.6 g/dL (ref 3.5–5.0)
ALT: 18 U/L (ref 17–63)
ANION GAP: 15 (ref 5–15)
AST: 29 U/L (ref 15–41)
Alkaline Phosphatase: 118 U/L (ref 38–126)
BILIRUBIN TOTAL: 1.5 mg/dL — AB (ref 0.3–1.2)
BUN: 35 mg/dL — ABNORMAL HIGH (ref 6–20)
CO2: 41 mmol/L — AB (ref 22–32)
Calcium: 10 mg/dL (ref 8.9–10.3)
Chloride: 84 mmol/L — ABNORMAL LOW (ref 101–111)
Creatinine, Ser: 1.43 mg/dL — ABNORMAL HIGH (ref 0.61–1.24)
GFR calc Af Amer: 56 mL/min — ABNORMAL LOW (ref >60)
GFR calc non Af Amer: 48 mL/min — ABNORMAL LOW (ref >60)
GLUCOSE: 115 mg/dL — AB (ref 65–99)
Potassium: 4.1 mmol/L (ref 3.5–5.1)
SODIUM: 140 mmol/L (ref 135–145)
TOTAL PROTEIN: 7.6 g/dL (ref 6.5–8.1)

## 2017-10-25 LAB — HEMOGLOBIN A1C
Hgb A1c MFr Bld: 6.9 % — ABNORMAL HIGH (ref 4.8–5.6)
Mean Plasma Glucose: 151.33 mg/dL

## 2017-10-25 LAB — MAGNESIUM: Magnesium: 2.1 mg/dL (ref 1.7–2.4)

## 2017-10-25 LAB — PROCALCITONIN: Procalcitonin: <0.1 ng/mL

## 2017-10-25 MED ORDER — METOLAZONE 2.5 MG PO TABS
2.5000 mg | ORAL_TABLET | Freq: Once | ORAL | Status: DC
  Filled 2017-10-25: qty 1

## 2017-10-25 MED ORDER — SODIUM CHLORIDE 0.9 % IV SOLN
1.5000 g | Freq: Four times a day (QID) | INTRAVENOUS | Status: DC
  Administered 2017-10-25 – 2017-10-27 (×7): 1.5 g via INTRAVENOUS
  Filled 2017-10-25 (×12): qty 1.5

## 2017-10-25 MED ORDER — ACETAZOLAMIDE 250 MG PO TABS
250.0000 mg | ORAL_TABLET | Freq: Two times a day (BID) | ORAL | Status: DC
  Administered 2017-10-25: 250 mg via ORAL
  Filled 2017-10-25: qty 1

## 2017-10-25 MED ORDER — ARFORMOTEROL TARTRATE 15 MCG/2ML IN NEBU
15.0000 ug | INHALATION_SOLUTION | Freq: Two times a day (BID) | RESPIRATORY_TRACT | Status: DC
  Administered 2017-10-25 – 2017-11-02 (×17): 15 ug via RESPIRATORY_TRACT
  Filled 2017-10-25 (×18): qty 2

## 2017-10-25 MED ORDER — SPIRONOLACTONE 25 MG PO TABS
25.0000 mg | ORAL_TABLET | Freq: Every day | ORAL | Status: DC
  Administered 2017-10-25: 25 mg via ORAL
  Filled 2017-10-25 (×2): qty 1

## 2017-10-25 MED ORDER — BUDESONIDE 0.5 MG/2ML IN SUSP
0.5000 mg | Freq: Two times a day (BID) | RESPIRATORY_TRACT | Status: DC
  Administered 2017-10-25 – 2017-11-02 (×17): 0.5 mg via RESPIRATORY_TRACT
  Filled 2017-10-25 (×18): qty 2

## 2017-10-25 NOTE — Progress Notes (Signed)
Patient ID: [name], male   DOB: 10/24/1945, 72 y.o.   MRN: [id number]                                                                PROGRESS NOTE                                                                                                                                                                                                             Patient Demographics:    [name], is a 72 y.o. male, DOB - 13-Nov-1945, [name]:[id number]  Admit date - 10/21/2017   Admitting Physician Lahoma Crocker, MD  Outpatient Primary MD for the patient is Georgann Housekeeper, MD  LOS - 4  Outpatient Specialists:    Chief Complaint  Patient presents with  . Weakness       Brief Narrative  72 y.o. 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  Patient also has O2 dependent COPD with ongoing tobacco abuse and history of heavy alcohol use although apparently has not had alcoholic beverages for over 2 years.  He was also noted to have severe PAD.  He follows in the outpatient setting with heart failure clinic.  Patient was at a physician's office today and was sent to the ER for further evaluation.  It was reported he had decreased appetite, increased fatigue and failure to thrive with generalized weakness all over.  On 3 L oxygen at rest also oximetry was 94%, he was normotensive, CBG 246.  Exam he was noted with diffuse posterior crackles.  X-ray revealed cardiomegaly with pulmonary vascular congestion superimposed on mild chronic interstitial lung disease.  In my discussion with the patient he denies orthopnea or  dyspnea on exertion.  He is noted with 2+ bilateral lower extremity edema.  He does not know his baseline weight.     In review of heart failure team records dry weight should be around 161 pounds.   Patient admitted for  combination of  COPD as well as heart failure exacerbation.        Subjective:    [name]  seen with the patient's family by the bedside, daughter states that he has been declining for the last several weeks, worse over the last 72 hours by her prior to presentation He is sleepy again after breakfast Currently on nasal cannula off BiPAP No chest pain or shortness of breath     Assessment  & Plan :    Principal Problem:   Acute on chronic combined systolic (congestive) and diastolic (congestive) heart failure (HCC) Active Problems:   Chronic hypoxemic respiratory failure (HCC)   Bipolar disorder (HCC)   Chronic atrial fibrillation (HCC)   History of CVA (cerebrovascular accident)   COPD (chronic obstructive pulmonary disease) (HCC)   Tobacco abuse   History of alcohol use   CKD (chronic kidney disease) stage 3, GFR 30-59 ml/min (HCC)   Biventricular automatic implantable cardioverter defibrillator in situ   Dementia    History of mitral valve replacement   Coronary artery disease   Situational depression   Grief reaction   Acute on chronic combined systolic (congestive) and diastolic (congestive) heart failure Acute hypoxic and hypercapnic hypoxic respiratory failure -Patient sent to ER from PCP after reports of failure to thrive, lethargy and ? Shortness of breath with reports of patient turning up home O2 from baseline of 3 L -Chest x-ray consistent with edema and noted with crackles on exam and bilateral lower extremity 2+ edema and JVD -BNP456 on admit. -Cardiac echo 5/18=> EF  30-35%, mod pulmonary hypertension -Lasix 80 mg IV every 12 hours , titrated by cardiology, metolazone 2.5 mg 1 Digoxin 0.125 daily Coreg 3.125 mg twice a day Aldactone  25 mg daily Losartan 12.5 mg daily Medications titrated by cardiology   CAD SevereLM stenosis found 1/19=>not a CABG candidate due to severe COPD and not PCI candidate due to inability to use Impelladue to severe PAD    Acute on Chronic hypoxemic , hypercapnic respiratory failure2/2COPD (chronic obstructive pulmonary disease)with ongoingTobacco abuse/aspiration pneumonia -Patient reportedly increasing home O2 due to apparent issues with shortness of breath although patient denies orthopnea, dyspnea on exertion, cough, fevers chills or palpitations -Continue DuoNeb and steroids   - ABG showed a pH of 7.29, PCO2 of 100, but on BiPAP overnight, ABG 7.43/73/83, May need BiPAP at home, will request respiratory therapist to clarify findings and then request case management to arrange for home -Not on LABA or SABA prior to admission -DuoNebprn -Patient reports smokes 2 packs of cigarettes per day CT CHEST Marked cardiomegaly and pulmonary venous congestion/right middle lobe pneumonia?. Will start patient on Unasyn Speech therapy evaluation    Situational depression/grief reaction  -Patient likely severely depressed at the loss of his sister 3 to go with family reporting that the patient is sleeping all the time, he is not eating, he is not showering and is not taking his medications -Not on psychotropic medications prior to admission and may benefit from addition of SSRI Psychiatry consult  Chronic atrial fibrillation  -100% ventricular paced -Continue Eliquis and carvedilol -CHA2DS2-VASc=7 -Struve prior AV nodal ablation subsequent implantation of biventricular AICD/pacemaker  CKD (chronic kidney disease) stage 3, GFR 30-59 ml/min  -GFR stable and at baseline -Follow labs closely with diuresis,improving, creatinine increased to 1.73 from 1.62, now 1.43    History of mitral valve replacement Bioprosthetic valve currently, originally Bjork-Shiley. Valve looked ok onecho  this admission.    Coronary artery disease/CABG -Last cardiac catheterization  January 2019 90% distal LM, chronic totally occludedpRCA. Not CABG candidate secondary to severe COPD. Severe PAD support Impella candidate therefore poor LMPCI candidate. -Medical management;continue carvedilol and Lipitor  Bipolar disorder/Dementia -Likely contributing to patient's difficulty managing home care -Not on psychotropic medications prior to admission Psychiatry consult 5/19, no treatment initiated  History of CVA (cerebrovascular accident) Cont Eliquis and statin  Dm2 Discontinue Glipizide while inpatient,  hemoglobin A1c 6.9 Fsbs ac and qhs,         Code Status : FULL CODE  Family Communication  :  By the bedside  Disposition Plan  : PT OT evaluation, pending improvement  of multiple medical problems  Barriers For Discharge :   Consults  :  Cardiology , psychiatry  Procedures  :   DVT Prophylaxis  :  Eliquis  Lab Results  Component Value Date   PLT 198 10/25/2017    Antibiotics  :    Anti-infectives (From admission, onward)   None        Objective:   Vitals:   10/25/17 0500 10/25/17 0809 10/25/17 0814 10/25/17 0916  BP: 139/73   (!) 156/65  Pulse: 60 61  65  Resp: (!) 23 (!) 22    Temp: 97.7 F (36.5 C) 97.9 F (36.6 C)    TempSrc: Axillary Axillary    SpO2: 98% 98% 94%   Weight: 73.2 kg (161 lb 6 oz)     Height:        Wt Readings from Last 3 Encounters:  10/25/17 73.2 kg (161 lb 6 oz)  08/03/17 76.4 kg (168 lb 6.9 oz)  06/22/17 79.9 kg (176 lb 3.2 oz)     Intake/Output Summary (Last 24 hours) at 10/25/2017 0919 Last data filed at 10/25/2017 0535 Gross per 24 hour  Intake 240 ml  Output 5075 ml  Net -4835 ml     Physical Exam  Awake Alert, Oriented X 3, No new F.N deficits, Normal affect Day.AT,PERRAL Supple Neck, slight JVD, No cervical lymphadenopathy appriciated.  Symmetrical Chest wall movement, Good air movement  bilaterally, slight bibasilar crackles, no wheezing RRR,No Gallops,Rubs or new Murmurs, No Parasternal Heave +ve B.Sounds, Abd Soft, No tenderness, No organomegaly appriciated, No rebound - guarding or rigidity. No Cyanosis, Clubbing or edema, No new Rash or bruise      Data Review:    CBC Recent Labs  Lab 10/21/17 1331 10/25/17 0604  WBC 9.3 12.3*  HGB 11.5* 12.7*  HCT 39.8 44.1  PLT 175 198  MCV 93.4 91.9  MCH 27.0 26.5  MCHC 28.9* 28.8*  RDW 16.0* 16.1*    Chemistries  Recent Labs  Lab 10/21/17 1331 10/22/17 0553 10/23/17 0620 10/24/17 0717 10/25/17 0604  NA 140 140 145 142 140  K 4.0 4.2 4.4 4.4 4.1  CL 96* 92* 94* 90* 84*  CO2 35* 38* 40* 42* 41*  GLUCOSE 163* 208* 107* 103* 115*  BUN 31* 38* 42* 42* 35*  CREATININE 1.62* 1.73* 1.77* 1.79* 1.43*  CALCIUM 8.9 8.9 9.2 9.1 10.0  MG  --   --  2.2  --  2.1  AST 34  --   --   --  29  ALT 24  --   --   --  18  ALKPHOS 135*  --   --   --  118  BILITOT 1.2  --   --   --  1.5*   ------------------------------------------------------------------------------------------------------------------ No results for input(s): CHOL, HDL, LDLCALC, TRIG, CHOLHDL, LDLDIRECT in the last  72 hours.  Lab Results  Component Value Date   HGBA1C 7.8 (H) 02/12/2017   ------------------------------------------------------------------------------------------------------------------ No results for input(s): TSH, T4TOTAL, T3FREE, THYROIDAB in the last 72 hours.  Invalid input(s): FREET3 ------------------------------------------------------------------------------------------------------------------ No results for input(s): VITAMINB12, FOLATE, FERRITIN, TIBC, IRON, RETICCTPCT in the last 72 hours.  Coagulation profile No results for input(s): INR, PROTIME in the last 168 hours.  No results for input(s): DDIMER in the last 72 hours.  Cardiac Enzymes No results for input(s): CKMB, TROPONINI, MYOGLOBIN in the last 168  hours.  Invalid input(s): CK ------------------------------------------------------------------------------------------------------------------    Component Value Date/Time   BNP 456.6 (H) 10/21/2017 1842    Inpatient Medications  Scheduled Meds: . acetaZOLAMIDE  250 mg Oral BID  . apixaban  5 mg Oral BID  . atorvastatin  40 mg Oral QHS  . carvedilol  3.125 mg Oral BID WC  . Chlorhexidine Gluconate Cloth  6 each Topical Q0600  . digoxin  0.125 mg Oral Daily  . famotidine  20 mg Oral QHS  . furosemide  80 mg Intravenous Q12H  . glipiZIDE  5 mg Oral QAC breakfast  . insulin aspart  0-5 Units Subcutaneous QHS  . insulin aspart  0-9 Units Subcutaneous TID WC  . losartan  12.5 mg Oral Daily  . mouth rinse  15 mL Mouth Rinse BID  . metolazone  2.5 mg Oral Once  . multivitamin with minerals  1 tablet Oral Daily  . mupirocin ointment  1 application Nasal BID  . sodium chloride flush  3 mL Intravenous Q12H  . spironolactone  25 mg Oral Daily  . thiamine  100 mg Oral Daily   Continuous Infusions: . sodium chloride     PRN Meds:.sodium chloride, acetaminophen, ipratropium-albuterol, ondansetron (ZOFRAN) IV, sodium chloride flush  Micro Results Recent Results (from the past 240 hour(s))  Respiratory Panel by PCR     Status: None   Collection Time: 10/21/17 10:40 PM  Result Value Ref Range Status   Adenovirus NOT DETECTED NOT DETECTED Final   Coronavirus 229E NOT DETECTED NOT DETECTED Final   Coronavirus HKU1 NOT DETECTED NOT DETECTED Final   Coronavirus NL63 NOT DETECTED NOT DETECTED Final   Coronavirus OC43 NOT DETECTED NOT DETECTED Final   Metapneumovirus NOT DETECTED NOT DETECTED Final   Rhinovirus / Enterovirus NOT DETECTED NOT DETECTED Final   Influenza A NOT DETECTED NOT DETECTED Final   Influenza B NOT DETECTED NOT DETECTED Final   Parainfluenza Virus 1 NOT DETECTED NOT DETECTED Final   Parainfluenza Virus 2 NOT DETECTED NOT DETECTED Final   Parainfluenza Virus 3 NOT  DETECTED NOT DETECTED Final   Parainfluenza Virus 4 NOT DETECTED NOT DETECTED Final   Respiratory Syncytial Virus NOT DETECTED NOT DETECTED Final   Bordetella pertussis NOT DETECTED NOT DETECTED Final   Chlamydophila pneumoniae NOT DETECTED NOT DETECTED Final   Mycoplasma pneumoniae NOT DETECTED NOT DETECTED Final    Comment: Performed at [facility], [address] [place], [place] [address]  MRSA PCR Screening     Status: Abnormal   Collection Time: 10/21/17 10:46 PM  Result Value Ref Range Status   MRSA by PCR POSITIVE (A) NEGATIVE Final    Comment:        The GeneXpert MRSA Assay (FDA approved for NASAL specimens only), is one component of a comprehensive MRSA colonization surveillance program. It is not intended to diagnose MRSA infection nor to guide or monitor treatment for MRSA infections. RESULT CALLED TO, READ BACK BY  AND VERIFIED WITH: J CVIJETIC RN 10/22/17 0349 JDW Performed at [facility], [address] [place], [place] [address]     Radiology Reports Dg Chest 2 View  Result Date: 10/21/2017 CLINICAL DATA:  Failure to thrive.  Hypoxia. EXAM: CHEST - 2 VIEW COMPARISON:  03/02/2016 FINDINGS: Bilateral diffuse mild interstitial thickening. Prominence of the central pulmonary vasculature. No focal consolidation, pleural effusion or pneumothorax. Calcified pleural plaque along the left diaphragmatic surface. Stable cardiomegaly. Prior median sternotomy and mitral valve replacement. Cardiac pacemaker is present. No acute osseous abnormality. IMPRESSION: 1. Cardiomegaly with pulmonary vascular congestion. 2. Likely underlying mild chronic interstitial lung disease. Electronically Signed   By: Elige Ko   On: 10/21/2017 14:00   Ct Chest Wo Contrast  Result Date: 10/21/2017 CLINICAL DATA:  Decreased appetite.  Acute respiratory illness. EXAM: CT CHEST WITHOUT CONTRAST TECHNIQUE: Multidetector CT imaging of the chest was performed following the standard  protocol without IV contrast. COMPARISON:  CT scan February 07, 2014.  Chest x-ray Oct 21, 2017. FINDINGS: Cardiovascular: Atherosclerotic changes in the nonaneurysmal aorta. The main pulmonary artery measures 3.2 cm. Cardiomegaly noted. Coronary artery calcifications identified. Mediastinum/Nodes: The thyroid and esophagus are normal. Stable shotty nodes in the mediastinum are likely reactive. Chest wall is normal. No effusions. Lungs/Pleura: Central airways are normal. No pneumothorax. Atelectasis in the left lung on axial image 42. Chronic changes in the left base. Focal infiltrate in the right base. Probable pulmonary venous congestion. Opacity in the right middle lobe may represent atelectasis versus infiltrate. No suspicious nodules or masses. Upper Abdomen: Cholelithiasis in the gallbladder. The gallbladder wall is mildly prominent, possibly due to poor distention. Musculoskeletal: No chest wall mass or suspicious bone lesions identified. IMPRESSION: 1. Marked cardiomegaly and pulmonary venous congestion. 2. Focal infiltrate in the right base and perhaps the right middle lobe is suggestive of pneumonia or aspiration. Recommend follow-up to resolution. 3. Atherosclerotic changes in the nonaneurysmal aorta. Coronary artery calcifications. 4. The main pulmonary artery is mildly dilated measuring 3.2 cm raising the possibility of pulmonary arterial hypertension. 5. Shotty nodes in the mediastinum are likely reactive. 6. Cholelithiasis. The gallbladder wall is mildly prominent, possibly due to poor distention. Recommend clinical correlation. Aortic Atherosclerosis (ICD10-I70.0). Electronically Signed   By: Gerome Sam III M.D   On: 10/21/2017 17:53    Time Spent in minutes  30   Richarda Overlie M.D on 10/25/2017 at 9:19 AM  Between 7am to 7pm - Pager - [phone]  After 7pm go to [email or web address] - password [facility]  [name] -  Office  [phone]

## 2017-10-25 NOTE — Progress Notes (Signed)
PT Cancellation Note  Patient Details Name: [name] MRN: [id number] DOB: 08-31-1945   Cancelled Treatment:    Reason Eval/Treat Not Completed: Medical issues which prohibited therapy.  Critical blood gas, will ck later as time and pt allow.   Ivar Drape 10/25/2017, 8:17 AM   Samul Dada, PT MS Acute Rehab Dept. Number: [facility] [id number] and [facility] [phone]

## 2017-10-25 NOTE — Progress Notes (Signed)
RT note- Bipap was removed from patient this morning and placed on home regiment of 2 5 [place] Progression Recent Vital Signs /Orders  BP (!) 156/65   Pulse 65   Temp 97.9 F (36.6 C) (Axillary)   Resp (!) 22   Ht 5\' 4"  (1.626 m)   Wt 161 lb 6 oz (73.2 kg)   SpO2 94%   BMI 27.70 kg/m    Past Medical History   Past Medical History:  Diagnosis Date  . AICD (automatic cardioverter/defibrillator) present   . Alcohol abuse   . Anxiety   . Bipolar disorder (HCC)   . Biventricular automatic implantable cardioverter defibrillator in situ    a. 01/2014 s/p MDT DTBA1D1 Viva XT CRT-D (ser # [id number]).  . CAD (coronary artery disease)    a. s/p prior PCI/stenting of the LCX;  b. 08/2014 MV: EF 20%, large septal, apical, and inferior infarct from apex to base, no ischemia;  c. 08/2014 NSTEMI/Cath: LM mod distal dzs extending into ostial LCX (80%), LAD tortuous, RI nl, OM mod dzs, RCA 100 CTO with R->R and L->R collats-->Med Rx.  . Chronic atrial fibrillation (HCC)    a. CHA2DS2VASc = 6->eliquis;  b. S/P AVN RFCA an BiV ICD placement.  . Chronic respiratory failure (HCC)   . Chronic systolic congestive heart failure (HCC)    a. 01/2014 Echo: EF 25-30%, mod conc LVH, mod dil LA.  . CKD (chronic kidney disease)    a. suspect stage II-III based on historical labs.  . Complete heart block (HCC)    a. In setting of prior AV nodal ablation r/t afib-->BiV ICD (01/2014).  . COPD (chronic obstructive pulmonary disease) (HCC)   . CVA (cerebral vascular accident) (HCC)    a. Multiple prior embolic strokes.  . Dementia   . Depression   . DVT (deep venous thrombosis) (HCC)   . Dyslipidemia   . Hyperglycemia   . Hypertension   . Mitral valve disease    a. remote mitral replacement with Bjork Shiley valve;  b. 06/2006 Redo MVR with tissue valve.  . Mixed Ischemic and Nonischemic Cardiomyopathy    a. 8/.2015 Echo: Ef 25-30%;  b. 01/2014 s/p MDT [id number] Talbot Grumbling XT CRT-D (ser # [id number]).  . On home oxygen therapy     "2L; 24/7" (65/16/2019)  . Psoriasis    "back of his head; elbows; finger of left hand" (06/07/2017)  . Ventral hernia      Expected Discharge Date     Diet   Diet Order           Diet heart healthy/carb modified Room service appropriate? Yes; Fluid consistency: Thin  Diet effective now           VTE Documentation  Other (Comment)(eliquis)   Work Intensity Score  3  Mobility  Head of Bed Elevated : Self regulated Range of Motion: Active, All extremities Level of Assistance: Maximum assist, patient does 25-49% Assistive Device: Bedside Commode, Front wheel walker Transport method: Bed  Consult Orders      Consult Orders  (From admission, onward)        Start     Ordered   10/23/17 2134  OT eval and treat  Routine     10/23/17 2134   10/22/17 1149  PT eval and treat  Routine     10/22/17 1148   10/21/17 1603  Consult to case management  Once    Comments:  Heart failure home health screen and may place order  for PT/OT eval and treat if indicated.  Provider:  (Not yet assigned)  Question:  Reason for consult:  Answer:  Other (see comments)   10/21/17 1604   10/21/17 1527  Consult to hospitalist  Once    Provider:  Russella Dar, NP  Question Answer Comment  Place call to: [facility]   Reason for Consult Admit      10/21/17 1529       CBG Orders      CBG Orders  (From admission, onward)        Start     Ordered   10/21/17 1700  CBG monitoring  (CHL IP INSULIN CORRECTION SENSITIVE)  4 times daily, before meals & at bedtime     10/21/17 1543       Abnormal Labs    Newt Lukes 10/25/2017, 10:17 AM L O2, RN was notified. Overnight setting on Bipap 16/8, BUR-12, Fio2 40%.

## 2017-10-25 NOTE — Progress Notes (Signed)
RT note- Patient taken off Bipap, RN aware.

## 2017-10-25 NOTE — Progress Notes (Signed)
Patient ID: [name], male   DOB: [birth year], 72 y.o.   MRN: [id number]     Advanced Heart Failure Rounding Note  PCP-Cardiologist: No primary care provider on file.   Subjective:    ABG 5/19 => 7.29/100/72.  Bipap started, repeat ABG 7.43/73/83.  He wore Bipap overnight, now on Basile.  He is more awake/alert this morning but still not very interactive.   He diuresed much better yesterday, weight down.     Objective:   Weight Range: 161 lb 6 oz (73.2 kg) Body mass index is 27.7 kg/m.   Vital Signs:   Temp:  [97.6 F (36.4 C)-99.1 F (37.3 C)] 97.9 F (36.6 C) (05/20 0809) Pulse Rate:  [59-65] 61 (05/20 0809) Resp:  [18-30] 22 (05/20 0809) BP: (124-152)/(56-73) 139/73 (05/20 0500) SpO2:  [93 %-100 %] 94 % (05/20 0814) Weight:  [161 lb 6 oz (73.2 kg)] 161 lb 6 oz (73.2 kg) (05/20 0500) Last BM Date: 10/24/17  Weight change: Filed Weights   10/23/17 0544 10/24/17 0500 10/25/17 0500  Weight: 165 lb 1 oz (74.9 kg) 164 lb 6.4 oz (74.6 kg) 161 lb 6 oz (73.2 kg)    Intake/Output:   Intake/Output Summary (Last 24 hours) at 10/25/2017 0845 Last data filed at 10/25/2017 0535 Gross per 24 hour  Intake 240 ml  Output 5075 ml  Net -4835 ml      Physical Exam    General: NAD Neck: JVP 10-12 cm, no thyromegaly or thyroid nodule.  Lungs: Distant BS CV: Nondisplaced PMI.  Heart regular S1/S2, no S3/S4, no murmur.  No peripheral edema.   Abdomen: Soft, nontender, no hepatosplenomegaly, no distention.  Skin: Intact without lesions or rashes.  Neurologic: Alert and answers questions with prompting.  Psych: Normal affect. Extremities: No clubbing or cyanosis.  HEENT: Normal.    Telemetry   Atrial fibrillation/BiV pacing (personally reviewed)   Labs    CBC Recent Labs    10/25/17 0604  WBC 12.3*  HGB 12.7*  HCT 44.1  MCV 91.9  PLT 198   Basic Metabolic Panel Recent Labs    40/34/74 0620 10/24/17 0717 10/25/17 0604  NA 145 142 140  K 4.4 4.4 4.1  CL 94*  90* 84*  CO2 40* 42* 41*  GLUCOSE 107* 103* 115*  BUN 42* 42* 35*  CREATININE 1.77* 1.79* 1.43*  CALCIUM 9.2 9.1 10.0  MG 2.2  --  2.1   Liver Function Tests Recent Labs    10/25/17 0604  AST 29  ALT 18  ALKPHOS 118  BILITOT 1.5*  PROT 7.6  ALBUMIN 3.6   No results for input(s): LIPASE, AMYLASE in the last 72 hours. Cardiac Enzymes No results for input(s): CKTOTAL, CKMB, CKMBINDEX, TROPONINI in the last 72 hours.  BNP: BNP (last 3 results) Recent Labs    02/01/17 1054 06/06/17 1037 10/21/17 1842  BNP 102.6* 486.4* 456.6*    ProBNP (last 3 results) Recent Labs    05/25/17 1152  PROBNP 1,108*     D-Dimer No results for input(s): DDIMER in the last 72 hours. Hemoglobin A1C No results for input(s): HGBA1C in the last 72 hours. Fasting Lipid Panel No results for input(s): CHOL, HDL, LDLCALC, TRIG, CHOLHDL, LDLDIRECT in the last 72 hours. Thyroid Function Tests No results for input(s): TSH, T4TOTAL, T3FREE, THYROIDAB in the last 72 hours.  Invalid input(s): FREET3  Other results:   Imaging    No results found.   Medications:     Scheduled Medications: .  acetaZOLAMIDE  250 mg Oral BID  . acetaZOLAMIDE  250 mg Oral BID  . apixaban  5 mg Oral BID  . atorvastatin  40 mg Oral QHS  . carvedilol  3.125 mg Oral BID WC  . Chlorhexidine Gluconate Cloth  6 each Topical Q0600  . digoxin  0.125 mg Oral Daily  . famotidine  20 mg Oral QHS  . furosemide  80 mg Intravenous Q12H  . glipiZIDE  5 mg Oral QAC breakfast  . insulin aspart  0-5 Units Subcutaneous QHS  . insulin aspart  0-9 Units Subcutaneous TID WC  . losartan  12.5 mg Oral Daily  . mouth rinse  15 mL Mouth Rinse BID  . metolazone  2.5 mg Oral Once  . multivitamin with minerals  1 tablet Oral Daily  . mupirocin ointment  1 application Nasal BID  . sodium chloride flush  3 mL Intravenous Q12H  . spironolactone  25 mg Oral Daily  . thiamine  100 mg Oral Daily    Infusions: . sodium chloride        PRN Medications: sodium chloride, acetaminophen, ipratropium-albuterol, ondansetron (ZOFRAN) IV, sodium chloride flush    Assessment/Plan   1. Acute on chronic systolic CHF: ECHO 05/23/15 EF 20-25% Mixed ischemic/nonischemic (ETOH) cardiomyopathy. S/p Medtronic CRT-D. Echo this admission with EF 30-35%. He was admitted with volume overload, remains volume overloaded today.  He diuresed better yesterday.  Has history of COPD, HCO3 rising with diuresis, 41 today.  Creatinine lower.  - Lasix 80 mg IV bid, will give dose of metolazone 2.5 again today.  - Acetazolamide 250 mg bid x 2 doses again today.  - Continue digoxin 0.125 daily.   -Can continue Coreg at lower dose 3.125 mg bid.  -Increase spironolactone to 25 mg daily.  - Creatinine stable, losartan 12.5 mg daily ordered.   2. Atrial fibrillation:Chronic,s/p AV nodal ablation. - Continue Eliquis for anticoagulation. 3. PAD:No claudication symptoms.  - Follows at [facility].  4. CAD:No chest pain. Severe LM stenosis found 1/19 =>not a CABG candidate due to severe COPD and not PCI candidate due to inability to use Impella due to severe PAD (have reviewed extensively with interventional cardiology). - No ASA with stable CAD and need for Eliquis.  - Continue statin.  5. Mitral valve replacement:Bioprosthetic valve currently, originally Bjork-Shiley. Valve looked ok onecho this admission.  6. ETOH abuse:Says he has quit drinking. 7. Smoking/COPD:On home oxygen, severe. Still smoking some.  8. CKD: Stage 3.  Creatinine at baseline.  9. Pulmonary: CT chest with RLL/RML infiltrate but PCT not elevated and no fever/WBC elevation.  Possible aspiration.  Not on antibiotics.  10. Neuro: Baseline bipolar disorder and dementia.  Family says he "sleeps all the time" at home.  Suspect hypercarbia in setting of severe COPD is the cause of this.  Delirium in the hospital.  11. Acute/chronic hypercarbic respiratory failure: Has severe  COPD on home oxygen at baseline.  Suspect he needs CPAP versus Bipap at home.  Lethargy at home is likely due to hypercarbia.   - Will ask pulmonary to see today to help with management.   Length of Stay: 4  Marca Ancona, MD  10/25/2017, 8:45 AM  Advanced Heart Failure Team Pager [phone] (M-F; 7a - 4p)  Please contact [facility] for night-coverage after hours (4p -7a ) and weekends on [email or web address]

## 2017-10-25 NOTE — Evaluation (Signed)
Occupational Therapy Evaluation Patient Details Name: [name] MRN: [id number] DOB: 08-May-1946 Today's Date: 10/25/2017    History of Present Illness 72 y.o. male sent to ER for decreased appetite, increased fatigue and failure to thrive with generalized weakness. PMH includes: chronic combined HF, COPD, a fib, pacemaker placement, mitral valve replacement CAD, CVA, chronic respiratory failure, Dementia, on home oxygen,    Clinical Impression   Pt with lethargy, closing his eyes unless constantly stimulated. VSS with Sp02 of 92% on 3L. Pt with bowel incontinence and had not called for assistance. Pt requiring max to total assist for ADL and transferred to chair with min assist. Pt's family plan to take him home per chart. Recommending HHOT. Will follow acutely.    Follow Up Recommendations  Home health OT;Supervision/Assistance - 24 hour(family is declining SNF)    Equipment Recommendations       Recommendations for Other Services       Precautions / Restrictions Precautions Precautions: Fall Restrictions Weight Bearing Restrictions: No      Mobility Bed Mobility Overal bed mobility: Needs Assistance Bed Mobility: Supine to Sit     Supine to sit: Supervision     General bed mobility comments: increased time, no physical assist  Transfers Overall transfer level: Needs assistance Equipment used: 1 person hand held assist Transfers: Sit to/from Stand;Stand Pivot Transfers Sit to Stand: Min assist Stand pivot transfers: Min assist       General transfer comment: assist to rise and steady, pt took small shuffling steps from bed to chair    Balance Overall balance assessment: Needs assistance   Sitting balance-Leahy Scale: Fair       Standing balance-Leahy Scale: Poor Standing balance comment: requires support of at least one hand in static standing, blocks LEs on bed                           ADL either performed or assessed with clinical  judgement   ADL Overall ADL's : Needs assistance/impaired Eating/Feeding: NPO   Grooming: Brushing hair;Sitting;Total assistance   Upper Body Bathing: Total assistance;Sitting   Lower Body Bathing: Total assistance;Sit to/from stand   Upper Body Dressing : Maximal assistance;Sitting   Lower Body Dressing: Total assistance;Sit to/from stand       Toileting- Architect and Hygiene: Total assistance;Sit to/from stand Toileting - Clothing Manipulation Details (indicate cue type and reason): pt incontinent of stool       General ADL Comments: pt requiring constant stimulation to remain awake     Vision Baseline Vision/History: Wears glasses Wears Glasses: At all times       Perception     Praxis      Pertinent Vitals/Pain Pain Assessment: Faces Faces Pain Scale: No hurt     Hand Dominance Left   Extremity/Trunk Assessment Upper Extremity Assessment Upper Extremity Assessment: Generalized weakness   Lower Extremity Assessment Lower Extremity Assessment: Defer to PT evaluation       Communication Communication Communication: HOH   Cognition Arousal/Alertness: Lethargic Behavior During Therapy: Flat affect Overall Cognitive Status: Impaired/Different from baseline Area of Impairment: Following commands;Orientation;Attention;Memory;Awareness;Safety/judgement                 Orientation Level: Disoriented to;Time;Situation Current Attention Level: Focused Memory: Decreased short-term memory Following Commands: Follows one step commands inconsistently Safety/Judgement: Decreased awareness of safety;Decreased awareness of deficits Awareness: Intellectual   General Comments: pt with minimal conversation, reported living alone    General Comments  Exercises     Shoulder Instructions      Home Living Family/patient expects to be discharged to:: Private residence Living Arrangements: Children;Other relatives Available Help at  Discharge: Family Type of Home: House Home Access: Ramped entrance     Home Layout: One level     Bathroom Shower/Tub: Producer, television/film/video: Standard     Home Equipment: Environmental consultant - 2 wheels;Other (comment);Shower seat;Electric scooter          Prior Functioning/Environment Level of Independence: Independent with assistive device(s)        Comments: failure to thrive, lives with daughter. sleeping most of the day for 1.5 years.         OT Problem List: Decreased strength;Decreased activity tolerance;Impaired balance (sitting and/or standing);Decreased cognition;Decreased knowledge of use of DME or AE;Decreased safety awareness      OT Treatment/Interventions: Self-care/ADL training;DME and/or AE instruction;Therapeutic activities;Cognitive remediation/compensation;Patient/family education;Balance training    OT Goals(Current goals can be found in the care plan section) Acute Rehab OT Goals Patient Stated Goal: did not state OT Goal Formulation: With family Time For Goal Achievement: 11/08/17 Potential to Achieve Goals: Good ADL Goals Pt Will Perform Eating: with supervision;sitting(when cleared to begin PO) Pt Will Perform Grooming: with min assist;standing Pt Will Perform Upper Body Dressing: with supervision;sitting Pt Will Perform Lower Body Dressing: with min assist;sit to/from stand Pt Will Transfer to Toilet: with min assist;ambulating Pt Will Perform Toileting - Clothing Manipulation and hygiene: with min assist;sit to/from stand  OT Frequency: Min 2X/week   Barriers to D/C:            Co-evaluation              AM-PAC PT "6 Clicks" Daily Activity     Outcome Measure Help from another person eating meals?: Total Help from another person taking care of personal grooming?: Total Help from another person toileting, which includes using toliet, bedpan, or urinal?: Total Help from another person bathing (including washing, rinsing, drying)?:  Total Help from another person to put on and taking off regular upper body clothing?: A Lot Help from another person to put on and taking off regular lower body clothing?: Total 6 Click Score: 7   End of Session Equipment Utilized During Treatment: Gait belt;Oxygen(3L)  Activity Tolerance: Patient limited by lethargy Patient left: in chair;with call bell/phone within reach;with chair alarm set  OT Visit Diagnosis: Unsteadiness on feet (R26.81);Other abnormalities of gait and mobility (R26.89);Muscle weakness (generalized) (M62.81);Cognitive communication deficit (R41.841)                Time: 9728-2060 OT Time Calculation (min): 25 min Charges:  OT General Charges $OT Visit: 1 Visit OT Evaluation $OT Eval Moderate Complexity: 1 Mod OT Treatments $Self Care/Home Management : 8-22 mins G-Codes:     2017-11-15 [name], OTR/L Pager: [phone]  Iran Planas, [place] Bailiff 11/15/17, 11:54 AM

## 2017-10-25 NOTE — Evaluation (Signed)
Clinical/Bedside Swallow Evaluation Patient Details  Name: [name] MRN: [id number] Date of Birth: July 10, 1945  Today's Date: 10/25/2017 Time: SLP Start Time (ACUTE ONLY): 1418 SLP Stop Time (ACUTE ONLY): 1432 SLP Time Calculation (min) (ACUTE ONLY): 14 min  Past Medical History:  Past Medical History:  Diagnosis Date  . AICD (automatic cardioverter/defibrillator) present   . Alcohol abuse   . Anxiety   . Bipolar disorder (HCC)   . Biventricular automatic implantable cardioverter defibrillator in situ    a. 01/2014 s/p MDT [id number] Viva XT CRT-D (ser # [id number]).  . CAD (coronary artery disease)    a. s/p prior PCI/stenting of the LCX;  b. 08/2014 MV: EF 20%, large septal, apical, and inferior infarct from apex to base, no ischemia;  c. 08/2014 NSTEMI/Cath: LM mod distal dzs extending into ostial LCX (80%), LAD tortuous, RI nl, OM mod dzs, RCA 100 CTO with R->R and L->R collats-->Med Rx.  . Chronic atrial fibrillation (HCC)    a. CHA2DS2VASc = 6->eliquis;  b. S/P AVN RFCA an BiV ICD placement.  . Chronic respiratory failure (HCC)   . Chronic systolic congestive heart failure (HCC)    a. 01/2014 Echo: EF 25-30%, mod conc LVH, mod dil LA.  . CKD (chronic kidney disease)    a. suspect stage II-III based on historical labs.  . Complete heart block (HCC)    a. In setting of prior AV nodal ablation r/t afib-->BiV ICD (01/2014).  . COPD (chronic obstructive pulmonary disease) (HCC)   . CVA (cerebral vascular accident) (HCC)    a. Multiple prior embolic strokes.  . Dementia   . Depression   . DVT (deep venous thrombosis) (HCC)   . Dyslipidemia   . Hyperglycemia   . Hypertension   . Mitral valve disease    a. remote mitral replacement with Bjork Shiley valve;  b. 06/2006 Redo MVR with tissue valve.  . Mixed Ischemic and Nonischemic Cardiomyopathy    a. 8/.2015 Echo: Ef 25-30%;  b. 01/2014 s/p MDT [id number] Talbot Grumbling XT CRT-D (ser # [id number]).  . On home oxygen therapy    "2L; 24/7" (65/16/2019)   . Psoriasis    "back of his head; elbows; finger of left hand" (06/07/2017)  . Ventral hernia    Past Surgical History:  Past Surgical History:  Procedure Laterality Date  . AV NODE ABLATION  2007  . CARDIAC CATHETERIZATION  06/2006   Mild ostial L main stenosis, CFX stent patent, RCA occluded (old)  . CORONARY ARTERY BYPASS GRAFT  2008  . CYSTOSCOPY W/ URETERAL STENT PLACEMENT  07/12/2011   Procedure: CYSTOSCOPY WITH RETROGRADE PYELOGRAM/URETERAL STENT PLACEMENT;  Surgeon: Sebastian Ache, MD;  Location: [facility] OR;  Service: Urology;  Laterality: Left;  . HERNIA REPAIR    . ICD GENERATOR CHANGE  02/01/2014   Gen change to: Medtronic VIVA pulse generator, serial number [id number]  . INSERT / REPLACE / REMOVE PACEMAKER    . LEFT HEART CATHETERIZATION WITH CORONARY ANGIOGRAM N/A 08/23/2014   Procedure: LEFT HEART CATHETERIZATION WITH CORONARY ANGIOGRAM;  Surgeon: Corky Crafts, MD;  Location[facility] CATH LAB;  Service: Cardiovascular;  Laterality: N/A;  . MITRAL VALVE REPLACEMENT     remote [facility] valve [id number] with redo tissue valve 06/2006  . PACEMAKER GENERATOR CHANGE N/A 02/01/2014   Procedure: PACEMAKER GENERATOR CHANGE;  Surgeon: Duke Salvia, MD;  Location: [facility] CATH LAB;  Service: Cardiovascular;  Laterality: N/A;  . PACEMAKER PLACEMENT  2006   Changed to CRT-D in 2009  .  RIGHT/LEFT HEART CATH AND CORONARY ANGIOGRAPHY N/A 06/09/2017   Procedure: RIGHT/LEFT HEART CATH AND CORONARY ANGIOGRAPHY;  Surgeon: [name], MD;  Location: [facility] INVASIVE CV LAB;  Service: Cardiovascular;  Laterality: N/A;   HPI:  72 y.o. male sent to ER for decreased appetite, increased fatigue and failure to thrive with generalized weakness. PMH includes: chronic combined HF, COPD, a fib, pacemaker placement, mitral valve replacement CAD, CVA, chronic respiratory failure, Dementia, on home oxygen. Pt had a BSE in 2013 recommending regular diet and thin liquids. In 2017 he was on Dys 3 diet and nectar thick  liquids due to suspected respiratory-based dysphagia, although he was able to d/c home on soft solids and thin liquids. Normal MBS in 2002.   Assessment / Plan / Recommendation Clinical Impression  Pt has immediate throat clearing after straw sips of thin liquids that is eliminated with straw removal. He consumed solids well given extra time. Recommend continuing regular textures and thin liquids but would use cup sips only and administer meds whole in puree. SLP will f/u for tolerance. SLP Visit Diagnosis: Dysphagia, unspecified (R13.10)    Aspiration Risk  Mild aspiration risk    Diet Recommendation Regular;Thin liquid   Liquid Administration via: Cup;No straw Medication Administration: Whole meds with puree Supervision: Patient able to self feed;Intermittent supervision to cue for compensatory strategies Compensations: Minimize environmental distractions;Slow rate;Small sips/bites Postural Changes: Seated upright at 90 degrees    Other  Recommendations Oral Care Recommendations: Oral care BID   Follow up Recommendations 24 hour supervision/assistance      Frequency and Duration min 2x/week  2 weeks       Prognosis        Swallow Study   General HPI: 72 y.o. male sent to ER for decreased appetite, increased fatigue and failure to thrive with generalized weakness. PMH includes: chronic combined HF, COPD, a fib, pacemaker placement, mitral valve replacement CAD, CVA, chronic respiratory failure, Dementia, on home oxygen. Pt had a BSE in 2013 recommending regular diet and thin liquids. In 2017 he was on Dys 3 diet and nectar thick liquids due to suspected respiratory-based dysphagia, although he was able to d/c home on soft solids and thin liquids. Normal MBS in 2002. Type of Study: Bedside Swallow Evaluation Previous Swallow Assessment: see HPI Diet Prior to this Study: Regular;Thin liquids Temperature Spikes Noted: No Respiratory Status: Nasal cannula History of Recent  Intubation: No Behavior/Cognition: Alert;Cooperative;Requires cueing Oral Cavity Assessment: Within Functional Limits Oral Care Completed by SLP: No Oral Cavity - Dentition: Dentures, top;Dentures, bottom Vision: Functional for self-feeding Self-Feeding Abilities: Able to feed self Patient Positioning: Upright in chair Baseline Vocal Quality: Hoarse(mild) Volitional Cough: Strong Volitional Swallow: Unable to elicit    Oral/Motor/Sensory Function Overall Oral Motor/Sensory Function: Within functional limits   Ice Chips Ice chips: Not tested   Thin Liquid Thin Liquid: Impaired Presentation: Cup;Self Fed;Straw Pharyngeal  Phase Impairments: Throat Clearing - Immediate    Nectar Thick Nectar Thick Liquid: Not tested   Honey Thick Honey Thick Liquid: Not tested   Puree Puree: Within functional limits Presentation: Self Fed;Spoon   Solid   GO   Solid: Within functional limits Presentation: Self Georjean Mode 10/25/2017,2:56 PM  Maxcine Ham, M.A. CCC-SLP [phone]

## 2017-10-25 NOTE — Consult Note (Signed)
Name: [name] MRN: [id number] DOB: 12/17/1945    ADMISSION DATE:  10/21/2017 CONSULTATION DATE:  10/25/2017  REFERRING MD :  Dr. Shirlee Latch  CHIEF COMPLAINT:  Weakness  BRIEF PATIENT DESCRIPTION:  72 y.o.  Acute on Chronic Hypercarbic Respiratory Failure, likely in setting of  Acute Systolic CHF Exacerbation with superimposed COPD, ? Aspiration  SIGNIFICANT EVENTS  5/16>> Admission to [facility] 5/19>> Lethargic/somnolent, required Bipap 5/20>> PCCM consulted  STUDIES:  CT Chest 5/16>> Marked cardiomegaly and pulmonary venous congestion. Focal infiltrate in the right base and perhaps the right middle lobe is suggestive of pneumonia or aspiration.The main pulmonary artery is mildly dilated measuring 3.2 cm raising the possibility of pulmonary arterial hypertension  ECHO 5/18>> Mild LVH. Systolic function was moderately to severely reduced. The estimated  ejection fraction was in the range of 30% to 35%.   Pulmonary arteries: Systolic pressure was moderately increased.  PA peak pressure: 65 mm Hg (S).  HISTORY OF PRESENT ILLNESS:   [name] is a 71 y.o. Male who presented to [facility] ED on 5/16 from the CHF office with c/o Decreased appetite, weakness and lethargy.  In the ED, initial workup was concerning for pulmonary edema, as CXR revealed cardiomegaly/pulmonary vascular congestion, and PE showed BLE edema. He was admitted by [facility] for further evaluation of acute on chronic combined CHF, +/- component of COPD exacerbation (former smoker, quit 06/27/17, 2.5 L O2 dependent COPD followed by Dr. [name]).  VBG was notable for pH 7.3/ 87.  He was treated with IV lasix + aldactone, strict I&O's, and BMP trended.  RVP completed and negative.  CT of the chest on 5/16 raised concern for focal infiltrate in the R base & RML concerning for Aspiration. PCT 0.13.  Since admission, he has diuresed 7.4L, with noted improvement in renal function ([phone] Serum Cr.).     Per history/chart review, the  pt suffered the loss of his sister approximately 3 weeks ago.  Since that time, there is concern that he has not been eating/drinking, and concern over compliance with his medications.  He has a hx of Bipolar disorder and depression.  He is followed by the CHF service for mixed cardiomyopathy (ETOH + Ischemic, LVEF ~ 30%, s/p ICD), known dry weight of 161 lbs.  On 5/19, he became much more lethargic and confused, ABG was drawn >> 7.286/ 100/ 71/ 46.  He was treated with Bipap overnight with improvement in mentation and alertness.  Follow up ABG post Bipap > 7.376/ 83.  In reviewing chart, pt had received no sedating medications.  PCCM is consulted for further evaluation of Acute on Chronic Hypercarbic Respiratory failure secondary to Acute CHF exacerbation with superimposed COPD, and ? Aspiration.   PAST MEDICAL HISTORY :   has a past medical history of AICD (automatic cardioverter/defibrillator) present, Alcohol abuse, Anxiety, Bipolar disorder (HCC), Biventricular automatic implantable cardioverter defibrillator in situ, CAD (coronary artery disease), Chronic atrial fibrillation (HCC), Chronic respiratory failure (HCC), Chronic systolic congestive heart failure (HCC), CKD (chronic kidney disease), Complete heart block (HCC), COPD (chronic obstructive pulmonary disease) (HCC), CVA (cerebral vascular accident) (HCC), Dementia, Depression, DVT (deep venous thrombosis) (HCC), Dyslipidemia, Hyperglycemia, Hypertension, Mitral valve disease, Mixed Ischemic and Nonischemic Cardiomyopathy, On home oxygen therapy, Psoriasis, and Ventral hernia.  has a past surgical history that includes Cystoscopy w/ ureteral stent placement (07/12/2011); Hernia repair; Mitral valve replacement; pacemaker placement (2006); AV node ablation (2007); Icd generator change (02/01/2014); Cardiac catheterization (06/2006); Coronary artery bypass graft (2008); pacemaker  generator change (N/A, 02/01/2014); left heart catheterization with  coronary angiogram (N/A, 08/23/2014); Insert / replace / remove pacemaker; and RIGHT/LEFT HEART CATH AND CORONARY ANGIOGRAPHY (N/A, 06/09/2017). Prior to Admission medications   Medication Sig Start Date End Date Taking? Authorizing Provider  apixaban (ELIQUIS) 5 MG TABS tablet Take 1 tablet (5 mg total) by mouth 2 (two) times daily. 02/12/17  Yes Little Ishikawa, NP  atorvastatin (LIPITOR) 40 MG tablet Take 1 tablet (40 mg total) by mouth daily. Patient taking differently: Take 40 mg by mouth daily at 6 PM.  05/13/16  Yes Clegg, Amy D, NP  carvedilol (COREG) 6.25 MG tablet Take 1 tablet (6.25 mg total) by mouth 2 (two) times daily with a meal. Patient taking differently: Take 6.25 mg by mouth daily.  06/13/17  Yes Rai, Ripudeep K, MD  glipiZIDE (GLUCOTROL) 5 MG tablet Take 5 mg daily before breakfast by mouth.   Yes [provider]  loratadine (CLARITIN) 10 MG tablet Take 1 tablet (10 mg total) by mouth daily. 06/14/17  Yes Rai, Ripudeep K, MD  losartan (COZAAR) 25 MG tablet Take 1 tablet (25 mg total) by mouth daily. 06/13/17  Yes Rai, Ripudeep K, MD  Multiple Vitamin (MULTIVITAMIN WITH MINERALS) TABS tablet Take 1 tablet by mouth daily. 05/24/15  Yes Osvaldo Shipper, MD  nitroGLYCERIN (NITROSTAT) 0.4 MG SL tablet Place 1 tablet (0.4 mg total) under the tongue every 5 (five) minutes as needed for chest pain. 01/06/15  Yes Robbie Lis M, PA-C  OXYGEN Inhale 2.5 L into the lungs continuous.    Yes [provider]  spironolactone (ALDACTONE) 25 MG tablet Take 0.5 tablets (12.5 mg total) by mouth daily. Patient taking differently: Take 12.5 mg by mouth at bedtime.  01/29/17  Yes [name], MD  thiamine 100 MG tablet Take 1 tablet (100 mg total) by mouth daily. 05/24/15  Yes Osvaldo Shipper, MD  torsemide (DEMADEX) 20 MG tablet Take 3 tablets (60 mg total) by mouth daily. Take 60mg  (3tabs) daily. Take extra 20mg  (1 tab) in afternoon if weight up more than 20lbs. 06/14/17  Yes Rai,  Delene Ruffini, MD   Allergies  Allergen Reactions  . Warfarin Other (See Comments)    Non compliance and ETOH abuse    FAMILY HISTORY:  family history includes Alzheimer's disease in his mother; Deep vein thrombosis in his son; Heart attack in his father and son; Heart disease in his brother, father, son, and unknown relative; Stroke in his unknown relative; Varicose Veins in his son. SOCIAL HISTORY:  reports that he has been smoking cigarettes.  He has a 90.00 pack-year smoking history. He has never used smokeless tobacco. He reports that he drinks about 12.6 oz of alcohol per week. He reports that he does not use drugs.  REVIEW OF SYSTEMS:  Positives in BOLD Constitutional: Negative for fever, chills, weight loss, malaise/fatigue and diaphoresis.  HENT: Negative for hearing loss, ear pain, nosebleeds, congestion, sore throat, neck pain, tinnitus and ear discharge.   Eyes: Negative for blurred vision, double vision, photophobia, pain, discharge and redness.  Respiratory: Negative for cough, hemoptysis, sputum production, shortness of breath, wheezing and stridor.   Cardiovascular: Negative for chest pain, palpitations, orthopnea, claudication, leg swelling and PND.  Gastrointestinal: Negative for heartburn, nausea, vomiting, abdominal pain, diarrhea, constipation, blood in stool and melena.  Genitourinary: Negative for dysuria, urgency, frequency, hematuria and flank pain.  Musculoskeletal: Negative for myalgias, back pain, joint pain and falls.  Skin: Negative for itching and  rash.  Neurological: Negative for dizziness, tingling, tremors, sensory change, speech change, focal weakness, seizures, loss of consciousness, weakness and headaches.  Endo/Heme/Allergies: Negative for environmental allergies and polydipsia. Does not bruise/bleed easily.  Pt denies all ROS, no complaints  SUBJECTIVE:  Pt states he is not currently SOB, denies cough, wheezing States he wore Bipap last  night Currently on 3L [place]  VITAL SIGNS: Temp:  [97.6 F (36.4 C)-98.4 F (36.9 C)] 98 F (36.7 C) (05/20 1223) Pulse Rate:  [59-66] 66 (05/20 1223) Resp:  [18-30] 19 (05/20 1223) BP: (125-156)/(59-73) 156/65 (05/20 0916) SpO2:  [93 %-100 %] 94 % (05/20 0814) Weight:  [161 lb 6 oz (73.2 kg)] 161 lb 6 oz (73.2 kg) (05/20 0500)  PHYSICAL EXAMINATION: General:  Acute on chronically ill appearing male, sitting in chair, on nasal cannula, in no acute distress Neuro:  Awake, Disoriented to time and situation, follows commands, Not very interactive HEENT:  Atraumatic, Normocephalic, Neck supple Cardiovascular:  Regular rhythm,Paced rhythm, s1s2 noted, no M/R/G Lungs:  Diminished throughout, symmetrical expansion, no assessory muscle use Abdomen:  Soft, non-tender Musculoskeletal:  No peripheral edema Skin:  Warm, dry.  No obvious rashes, lesions, or ulcerations  Recent Labs  Lab 10/23/17 0620 10/24/17 0717 10/25/17 0604  NA 145 142 140  K 4.4 4.4 4.1  CL 94* 90* 84*  CO2 40* 42* 41*  BUN 42* 42* 35*  CREATININE 1.77* 1.79* 1.43*  GLUCOSE 107* 103* 115*   Recent Labs  Lab 10/21/17 1331 10/25/17 0604  HGB 11.5* 12.7*  HCT 39.8 44.1  WBC 9.3 12.3*  PLT 175 198   No results found.  ASSESSMENT / PLAN:  PULMONARY A: Acute on Chronic Hypercarbic Respiratory Failure, likely in setting of  Acute Systolic CHF Exacerbation with superimposed COPD, ? Aspiration Hx: COPD on 2.5 L Home O2, tobacco abuse  P: Supplemental O2 to maintain O2 sats 88%-92% Avoid sedating medications Continue Empiric Unasyn, assess PCT, if negative can D/c Abx Monitor Fever curve Follow CBC F/u CXR If pt becomes Lethargic/somnolent, place on Bipap Prn Duonebs D/c Diamox as can contribute to Hypercarbia Bipap qhs and PRN Diuresis per Cardiology  Encourage smoking cessation  CARDIAC A: Acute on Chronic Systolic CHF Hx: Systolic CHF, A-fib, PAD, CAD, Mitral valve replacement P: Cardiology  following, CHF management per Cardiology   Harlon Ditty, AGACNP-BC [facility] Pager: [phone]  10/25/2017, 12:48 PM

## 2017-10-26 ENCOUNTER — Inpatient Hospital Stay (HOSPITAL_COMMUNITY): Payer: Medicare Other

## 2017-10-26 ENCOUNTER — Inpatient Hospital Stay: Payer: Self-pay

## 2017-10-26 DIAGNOSIS — G9340 Encephalopathy, unspecified: Secondary | ICD-10-CM

## 2017-10-26 DIAGNOSIS — I482 Chronic atrial fibrillation: Secondary | ICD-10-CM

## 2017-10-26 LAB — APTT: aPTT: 100 seconds — ABNORMAL HIGH (ref 24–36)

## 2017-10-26 LAB — CBC
HEMATOCRIT: 46.5 % (ref 39.0–52.0)
HEMOGLOBIN: 13.8 g/dL (ref 13.0–17.0)
MCH: 26.6 pg (ref 26.0–34.0)
MCHC: 29.7 g/dL — AB (ref 30.0–36.0)
MCV: 89.8 fL (ref 78.0–100.0)
Platelets: 200 10*3/uL (ref 150–400)
RBC: 5.18 MIL/uL (ref 4.22–5.81)
RDW: 16.4 % — ABNORMAL HIGH (ref 11.5–15.5)
WBC: 13 10*3/uL — AB (ref 4.0–10.5)

## 2017-10-26 LAB — GLUCOSE, CAPILLARY
GLUCOSE-CAPILLARY: 135 mg/dL — AB (ref 65–99)
Glucose-Capillary: 157 mg/dL — ABNORMAL HIGH (ref 65–99)
Glucose-Capillary: 209 mg/dL — ABNORMAL HIGH (ref 65–99)
Glucose-Capillary: 155 mg/dL — ABNORMAL HIGH (ref 65–99)

## 2017-10-26 LAB — COMPREHENSIVE METABOLIC PANEL
ALBUMIN: 3.4 g/dL — AB (ref 3.5–5.0)
ALK PHOS: 122 U/L (ref 38–126)
ALT: 18 U/L (ref 17–63)
ANION GAP: 13 (ref 5–15)
AST: 30 U/L (ref 15–41)
BILIRUBIN TOTAL: 1.2 mg/dL (ref 0.3–1.2)
BUN: 43 mg/dL — ABNORMAL HIGH (ref 6–20)
CALCIUM: 10 mg/dL (ref 8.9–10.3)
CO2: 40 mmol/L — ABNORMAL HIGH (ref 22–32)
Chloride: 85 mmol/L — ABNORMAL LOW (ref 101–111)
Creatinine, Ser: 1.7 mg/dL — ABNORMAL HIGH (ref 0.61–1.24)
GFR, EST AFRICAN AMERICAN: 45 mL/min — AB (ref >60)
GFR, EST NON AFRICAN AMERICAN: 39 mL/min — AB (ref >60)
GLUCOSE: 138 mg/dL — AB (ref 65–99)
POTASSIUM: 3.8 mmol/L (ref 3.5–5.1)
Sodium: 138 mmol/L (ref 135–145)
TOTAL PROTEIN: 8.2 g/dL — AB (ref 6.5–8.1)

## 2017-10-26 LAB — POCT I-STAT 3, ART BLOOD GAS (G3+)
ACID-BASE EXCESS: 18 mmol/L — AB (ref 0.0–2.0)
Bicarbonate: 46 mmol/L — ABNORMAL HIGH (ref 20.0–28.0)
O2 Saturation: 100 %
PH ART: 7.465 — AB (ref 7.350–7.450)
Patient temperature: 98.1
TCO2: 48 mmol/L — AB (ref 22–32)
pCO2 arterial: 63.9 mmHg — ABNORMAL HIGH (ref 32.0–48.0)
pO2, Arterial: 339 mmHg — ABNORMAL HIGH (ref 83.0–108.0)

## 2017-10-26 LAB — BLOOD GAS, ARTERIAL
ACID-BASE EXCESS: 17.7 mmol/L — AB (ref 0.0–2.0)
Bicarbonate: 43.4 mmol/L — ABNORMAL HIGH (ref 20.0–28.0)
DELIVERY SYSTEMS: POSITIVE
DRAWN BY: 358491
EXPIRATORY PAP: 8
FIO2: 40
Inspiratory PAP: 14
O2 SAT: 97.4 %
PEEP/CPAP: 8 cmH2O
Patient temperature: 98.6
RATE: 12 resp/min
pCO2 arterial: 69.1 mmHg (ref 32.0–48.0)
pH, Arterial: 7.415 (ref 7.350–7.450)
pO2, Arterial: 98.2 mmHg (ref 83.0–108.0)

## 2017-10-26 LAB — TSH: TSH: 2.702 u[IU]/mL (ref 0.350–4.500)

## 2017-10-26 LAB — AMMONIA: AMMONIA: 58 umol/L — AB (ref 9–35)

## 2017-10-26 LAB — PROCALCITONIN

## 2017-10-26 MED ORDER — CHLORHEXIDINE GLUCONATE 0.12% ORAL RINSE (MEDLINE KIT)
15.0000 mL | Freq: Two times a day (BID) | OROMUCOSAL | Status: DC
  Administered 2017-10-27: 15 mL via OROMUCOSAL

## 2017-10-26 MED ORDER — HEPARIN (PORCINE) IN NACL 100-0.45 UNIT/ML-% IJ SOLN
650.0000 [IU]/h | INTRAMUSCULAR | Status: DC
  Administered 2017-10-26: 950 [IU]/h via INTRAVENOUS
  Administered 2017-10-27 – 2017-10-29 (×2): 650 [IU]/h via INTRAVENOUS
  Filled 2017-10-26 (×3): qty 250

## 2017-10-26 MED ORDER — TORSEMIDE 20 MG PO TABS
60.0000 mg | ORAL_TABLET | Freq: Two times a day (BID) | ORAL | Status: DC
Start: 2017-10-26 — End: 2017-10-26

## 2017-10-26 MED ORDER — FAMOTIDINE 20 MG PO TABS
20.0000 mg | ORAL_TABLET | Freq: Every day | ORAL | Status: DC
  Administered 2017-10-26 – 2017-10-27 (×2): 20 mg
  Filled 2017-10-26 (×2): qty 1

## 2017-10-26 MED ORDER — SENNOSIDES 8.8 MG/5ML PO SYRP
5.0000 mL | ORAL_SOLUTION | Freq: Two times a day (BID) | ORAL | Status: DC | PRN
  Administered 2017-10-27: 5 mL
  Filled 2017-10-26: qty 5

## 2017-10-26 MED ORDER — ORAL CARE MOUTH RINSE: 15.0000 mL | Freq: Four times a day (QID) | OROMUCOSAL | Status: DC

## 2017-10-26 MED ORDER — DIGOXIN 125 MCG PO TABS
0.1250 mg | ORAL_TABLET | Freq: Every day | ORAL | Status: DC
  Administered 2017-10-26 – 2017-10-28 (×3): 0.125 mg
  Filled 2017-10-26 (×2): qty 1

## 2017-10-26 MED ORDER — CHLORHEXIDINE GLUCONATE 0.12% ORAL RINSE (MEDLINE KIT)
15.0000 mL | Freq: Two times a day (BID) | OROMUCOSAL | Status: DC
  Administered 2017-10-26 (×2): 15 mL via OROMUCOSAL

## 2017-10-26 MED ORDER — ORAL CARE MOUTH RINSE
15.0000 mL | Freq: Four times a day (QID) | OROMUCOSAL | Status: DC
  Administered 2017-10-26 – 2017-10-27 (×4): 15 mL via OROMUCOSAL

## 2017-10-26 MED ORDER — FENTANYL CITRATE (PF) 100 MCG/2ML IJ SOLN: 50.0000 ug | INTRAMUSCULAR | Status: DC | PRN

## 2017-10-26 MED ORDER — TORSEMIDE 20 MG PO TABS
60.0000 mg | ORAL_TABLET | Freq: Two times a day (BID) | ORAL | Status: DC
  Administered 2017-10-26: 60 mg
  Filled 2017-10-26 (×2): qty 3

## 2017-10-26 MED ORDER — CARVEDILOL 3.125 MG PO TABS
3.1250 mg | ORAL_TABLET | Freq: Two times a day (BID) | ORAL | Status: DC
  Administered 2017-10-26 – 2017-10-28 (×4): 3.125 mg
  Filled 2017-10-26 (×5): qty 1

## 2017-10-26 MED ORDER — INSULIN ASPART 100 UNIT/ML ~~LOC~~ SOLN: 0.0000 [IU] | SUBCUTANEOUS | Status: DC

## 2017-10-26 MED ORDER — MIDAZOLAM HCL 2 MG/2ML IJ SOLN
1.0000 mg | Freq: Once | INTRAMUSCULAR | Status: AC
  Administered 2017-10-26: 1 mg via INTRAVENOUS

## 2017-10-26 MED ORDER — CHLORHEXIDINE GLUCONATE CLOTH 2 % EX PADS: 6.0000 | MEDICATED_PAD | Freq: Every day | CUTANEOUS | Status: DC

## 2017-10-26 MED ORDER — SPIRONOLACTONE 25 MG PO TABS
25.0000 mg | ORAL_TABLET | Freq: Every day | ORAL | Status: DC
  Administered 2017-10-26 – 2017-10-28 (×3): 25 mg
  Filled 2017-10-26 (×2): qty 1

## 2017-10-26 MED ORDER — VITAMIN B-1 100 MG PO TABS
100.0000 mg | ORAL_TABLET | Freq: Every day | ORAL | Status: DC
  Administered 2017-10-27 – 2017-10-28 (×2): 100 mg
  Filled 2017-10-26 (×2): qty 1

## 2017-10-26 MED ORDER — CHLORHEXIDINE GLUCONATE CLOTH 2 % EX PADS
6.0000 | MEDICATED_PAD | Freq: Every day | CUTANEOUS | Status: AC
  Administered 2017-10-27 – 2017-10-31 (×5): 6 via TOPICAL

## 2017-10-26 MED ORDER — SODIUM CHLORIDE 0.9% FLUSH
10.0000 mL | Freq: Two times a day (BID) | INTRAVENOUS | Status: DC
  Administered 2017-10-26: 20 mL
  Administered 2017-10-26 – 2017-10-30 (×7): 10 mL
  Administered 2017-10-31: 20 mL
  Administered 2017-10-31 – 2017-11-02 (×4): 10 mL
  Administered 2017-11-02: 20 mL
  Administered 2017-11-03: 10 mL

## 2017-10-26 MED ORDER — FENTANYL CITRATE (PF) 100 MCG/2ML IJ SOLN
INTRAMUSCULAR | Status: AC
  Administered 2017-10-26: 50 ug via INTRAVENOUS
  Filled 2017-10-26: qty 2

## 2017-10-26 MED ORDER — FENTANYL CITRATE (PF) 100 MCG/2ML IJ SOLN
50.0000 ug | Freq: Once | INTRAMUSCULAR | Status: AC
  Administered 2017-10-26: 50 ug via INTRAVENOUS

## 2017-10-26 MED ORDER — ETOMIDATE 2 MG/ML IV SOLN
20.0000 mg | Freq: Once | INTRAVENOUS | Status: AC
  Administered 2017-10-26: 20 mg via INTRAVENOUS

## 2017-10-26 MED ORDER — SODIUM CHLORIDE 0.9% FLUSH: 10.0000 mL | INTRAVENOUS | Status: DC | PRN

## 2017-10-26 MED ORDER — ROCURONIUM BROMIDE 50 MG/5ML IV SOLN
60.0000 mg | Freq: Once | INTRAVENOUS | Status: AC
  Administered 2017-10-26: 60 mg via INTRAVENOUS
  Filled 2017-10-26: qty 6

## 2017-10-26 MED ORDER — LOSARTAN POTASSIUM 25 MG PO TABS
12.5000 mg | ORAL_TABLET | Freq: Every day | ORAL | Status: DC
  Administered 2017-10-26: 12.5 mg

## 2017-10-26 MED ORDER — LACTATED RINGERS IV BOLUS: 500.0000 mL | Freq: Once | INTRAVENOUS | Status: DC

## 2017-10-26 MED ORDER — MIDAZOLAM HCL 2 MG/2ML IJ SOLN
INTRAMUSCULAR | Status: AC
  Administered 2017-10-26: 1 mg via INTRAVENOUS
  Filled 2017-10-26: qty 2

## 2017-10-26 MED ORDER — INSULIN ASPART 100 UNIT/ML ~~LOC~~ SOLN
0.0000 [IU] | SUBCUTANEOUS | Status: DC
  Administered 2017-10-26: 1 [IU] via SUBCUTANEOUS
  Administered 2017-10-26: 3 [IU] via SUBCUTANEOUS
  Administered 2017-10-26: 2 [IU] via SUBCUTANEOUS
  Administered 2017-10-27 (×3): 1 [IU] via SUBCUTANEOUS
  Administered 2017-10-27: 2 [IU] via SUBCUTANEOUS
  Administered 2017-10-27: 3 [IU] via SUBCUTANEOUS
  Administered 2017-10-27: 1 [IU] via SUBCUTANEOUS
  Administered 2017-10-28: 2 [IU] via SUBCUTANEOUS
  Administered 2017-10-28: 5 [IU] via SUBCUTANEOUS
  Administered 2017-10-28: 3 [IU] via SUBCUTANEOUS
  Administered 2017-10-28 (×2): 2 [IU] via SUBCUTANEOUS
  Administered 2017-10-29 (×2): 1 [IU] via SUBCUTANEOUS

## 2017-10-26 MED ORDER — ADULT MULTIVITAMIN W/MINERALS CH
1.0000 | ORAL_TABLET | Freq: Every day | ORAL | Status: DC
  Administered 2017-10-27 – 2017-10-28 (×2): 1
  Filled 2017-10-26 (×2): qty 1

## 2017-10-26 MED ORDER — ATORVASTATIN CALCIUM 40 MG PO TABS
40.0000 mg | ORAL_TABLET | Freq: Every day | ORAL | Status: DC
  Administered 2017-10-26 – 2017-10-27 (×2): 40 mg
  Filled 2017-10-26 (×2): qty 1

## 2017-10-26 NOTE — Progress Notes (Signed)
Pt transported on Bipap from 6E to 2H13 for intubation. Vitals remained stable during transport.

## 2017-10-26 NOTE — Procedures (Signed)
ELECTROENCEPHALOGRAM REPORT  Date of Study: 10/26/2017  Patient's Name: [name] MRN: [id number] Date of Birth: Sep 13, 1945  Referring Provider: Maxine Glenn, PA-C  Clinical History: This is a 72 year old man with altered mental status.  Medications: No sedating medications listed  Technical Summary: A multichannel digital EEG recording measured by the international 10-20 system with electrodes applied with paste and impedances below 5000 ohms performed as portable with EKG monitoring in an intubated patient, no sedating medications listed.  Hyperventilation and photic stimulation were not performed.  The digital EEG was referentially recorded, reformatted, and digitally filtered in a variety of bipolar and referential montages for optimal display.   Description: The patient is awake and drowsy during the recording.  During maximal wakefulness, there is a symmetric, medium voltage 5 Hz posterior dominant rhythm that poorly attenuates with eye opening and eye closure. This is admixed with a moderate amount of diffuse 4-5 Hz theta and occasional diffuse 2-3 Hz delta slowing of the background, at times with triphasic appearance. Normal sleep architecture is not seen. Hyperventilation and photic stimulation were not performed. There were occasional sharp waves seen over the left midtemporal and left occipital region (phase reversal seen at T3 and O1). There were no electrographic seizures seen.    EKG lead showed frequent extrasystolic beats.  Impression: This awake and drowsy EEG is abnormal due to the presence of: 1. Moderate diffuse background slowing with occasional triphasic waves 2. Occasional epileptiform discharges over the left midtemporal and left occipital regions  Clinical Correlation of the above findings indicates diffuse cerebral dysfunction that is non-specific in etiology and can be seen with hypoxic/ischemic injury, toxic/metabolic encephalopathies, or medication  effect. Triphasic waves are typically seen with hepatic encephalopathy but can be seen with other metabolic encephalopathies as well. There is a possible tendency for seizures to arise from the left midtemporal and left occipital regions. Clinical correlation is advised.   Patrcia Dolly, M.D.

## 2017-10-26 NOTE — Progress Notes (Addendum)
Patient ID: [name], male   DOB: [birth year], 72 y.o.   MRN: [id number]                                                                PROGRESS NOTE                                                                                                                                                                                                             Patient Demographics:    [name], is a 72 y.o. male, DOB - 1946/04/07, [name]:[id number]  Admit date - 10/21/2017   Admitting Physician Lahoma Crocker, MD  Outpatient Primary MD for the patient is Georgann Housekeeper, MD  LOS - 5  Outpatient Specialists:    Chief Complaint  Patient presents with  . Weakness       Brief Narrative  72 y.o. 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  Patient also has O2 dependent COPD with ongoing tobacco abuse and history of heavy alcohol use although apparently has not had alcoholic beverages for over 2 years.  He was also noted to have severe PAD.  He follows in the outpatient setting with heart failure clinic.  Patient was at a physician's office today and was sent to the ER for further evaluation.  It was reported he had decreased appetite, increased fatigue and failure to thrive with generalized weakness all over.  On 3 L oxygen at rest also oximetry was 94%, he was normotensive, CBG 246.  Exam he was noted with diffuse posterior crackles.  X-ray revealed cardiomegaly with pulmonary vascular congestion superimposed on mild chronic interstitial lung disease.  In my discussion with the patient he denies orthopnea or  dyspnea on exertion.  He is noted with 2+ bilateral lower extremity edema.  He does not know his baseline weight.     In review of heart failure team records dry weight should be around 161 pounds.   Patient admitted for  combination of  COPD as well as heart failure exacerbation. Patient lethargic unresponsive during this hospitalization found to be in hypercapnic respiratory failure and initiated on BiPAP. Critical care consulted and following. Heart failure team titrating medications. Expected length of stay several days, prognosis appears guarded        Subjective:    [name]   remains on BiPAP, less responsive today, despite improvement in ABG yesterday Stat ABG showed pH of 7.415, PCO2 69.1   Assessment  & Plan :    Principal Problem:   Acute on chronic combined systolic (congestive) and diastolic (congestive) heart failure (HCC) Active Problems:   Chronic hypoxemic respiratory failure (HCC)   Bipolar disorder (HCC)   Chronic atrial fibrillation (HCC)   History of CVA (cerebrovascular accident)   COPD (chronic obstructive pulmonary disease) (HCC)   Tobacco abuse   History of alcohol use   CKD (chronic kidney disease) stage 3, GFR 30-59 ml/min (HCC)   Biventricular automatic implantable cardioverter defibrillator in situ   Dementia    History of mitral valve replacement   Coronary artery disease   Situational depression   Grief reaction   Acute respiratory failure (HCC)   Acute on chronic combined systolic (congestive) and diastolic (congestive) heart failure Acute hypoxic and hypercapnic hypoxic respiratory failure -Patient sent to ER from PCP after reports of failure to thrive, lethargy and ? Shortness of breath with reports of patient turning up home O2 from baseline of 3 L -Chest x-ray consistent with edema and noted with crackles on exam and bilateral lower extremity 2+ edema and JVD -BNP456 on admit. -Cardiac echo 5/18=> EF  30-35%, mod pulmonary  hypertension Diuretics as per cardiology, was receiving 80 mg IV twice a day, now on torsemide 60 mg by mouth twice a day, acetazolamide discontinued based on PCCM recommendations Digoxin 0.125 daily Coreg 3.125 mg twice a day Aldactone 25 mg daily Losartan 12.5 mg daily Medications titrated by cardiology Patient intubated for airway protection and transferred to ICU     CAD SevereLM stenosis found 1/19=>not a CABG candidate due to severe COPD and not PCI candidate due to inability to use Impelladue to severe PAD    Acute on Chronic hypoxemic , hypercapnic respiratory failure2/2COPD (chronic obstructive pulmonary disease)with ongoingTobacco abuse/aspiration pneumonia -Patient reportedly increasing home O2 due to apparent issues with shortness of breath although patient denies orthopnea, dyspnea on exertion, cough, fevers chills or palpitations -Continue DuoNeb and steroids   - ABG showed a pH of 7.29, PCO2 of 100, after being placed on BiPAP overnight, ABG 7.43/73/83, ABG showing improvement but patient continues to be lethargic May need BiPAP at home, will request respiratory therapist to clarify findings and then request case management to arrange for home Patient continues to smoke at home Respiratory panel negative this admission -Not on LABA or SABA prior to admission -DuoNebprn -Pulmonary following CT CHEST Marked cardiomegaly and pulmonary venous congestion/right middle lobe pneumonia?. Started on Unasyn for pneumonia 5/20 Speech therapy evaluation-regular diet and thin liquids However given altered mental status will keep patient NPO, pending improvement CT head without contrast to further evaluate, if negative will need MRI of the brain    Situational depression/grief reaction  -Patient likely severely depressed at the loss of his sister 3 to go with family reporting that the patient is sleeping all the time, he is not eating, he is not showering and is not taking  his medications -Not on psychotropic medications prior to admission and may benefit from addition of SSRI Psychiatry consult  completed 5/19  Chronic atrial fibrillation  -100% ventricular paced -Continue Eliquis and carvedilol -CHA2DS2-VASc=7 -Struve prior AV nodal ablation subsequent implantation of biventricular AICD/pacemaker  CKD (chronic kidney disease) stage 3, GFR 30-59 ml/min  -GFR stable and at baseline -Follow labs closely with diuresis, creatinine fluctuating 1.4-1.7   History of mitral valve replacement Bioprosthetic valve currently, originally Bjork-Shiley. Valve looked ok onecho this admission.    Coronary artery disease/CABG -Last cardiac catheterization January 2019 90% distal LM, chronic totally occludedpRCA. Not CABG candidate secondary to severe COPD. Severe PAD support Impella candidate therefore poor LMPCI candidate. -Medical management;continue carvedilol and Lipitor  Bipolar disorder/Dementia -Likely contributing to patient's difficulty managing home care -Not on psychotropic medications prior to admission Psychiatry consult 5/19, no treatment initiated  History of CVA (cerebrovascular accident) Cont Eliquis and statin  Dm2 Discontinue Glipizide while inpatient,  hemoglobin A1c 6.9 Fsbs ac and qhs, Accu-Chek stable    Code Status : FULL CODE  Family Communication  :  By the bedside  Disposition Plan  : PT OT evaluation, pending improvement  of multiple medical problems  Barriers For Discharge :   Consults  :  Cardiology , psychiatry  Procedures  :   DVT Prophylaxis  :  Eliquis  Lab Results  Component Value Date   PLT 200 10/26/2017    Antibiotics  :    Anti-infectives (From admission, onward)   Start     Dose/Rate Route Frequency Ordered Stop   10/25/17 1015  ampicillin-sulbactam (UNASYN) 1.5 g in sodium chloride 0.9 % 100 mL IVPB     1.5 g 200 mL/hr over 30 Minutes Intravenous Every 6 hours 10/25/17 0925           Objective:   Vitals:   10/25/17 2349 10/26/17 0549 10/26/17 0551 10/26/17 0817  BP: 127/73  120/66 139/88  Pulse: (!) 58  65 66  Resp: 19  (!) 24 17  Temp: 98.3 F (36.8 C)  98.7 F (37.1 C) 97.6 F (36.4 C)  TempSrc: Axillary  Axillary Axillary  SpO2: 95%  96% 95%  Weight:  70.2 kg (154 lb 12.2 oz)    Height:        Wt Readings from Last 3 Encounters:  10/26/17 70.2 kg (154 lb 12.2 oz)  08/03/17 76.4 kg (168 lb 6.9 oz)  06/22/17 79.9 kg (176 lb 3.2 oz)     Intake/Output Summary (Last 24 hours) at 10/26/2017 0936 Last data filed at 10/26/2017 0700 Gross per 24 hour  Intake 640 ml  Output 3525 ml  Net -2885 ml     Physical Exam  Awake Alert somnolent and lethargic on BiPAP [place].AT,PERRAL Supple Neck, slight JVD, No cervical lymphadenopathy appriciated.  Symmetrical Chest wall movement, Good air movement bilaterally, slight bibasilar crackles, no wheezing RRR,No Gallops,Rubs or new Murmurs, No Parasternal Heave +ve B.Sounds, Abd Soft, No tenderness, No organomegaly appriciated, No rebound - guarding or rigidity. No Cyanosis, Clubbing or edema, No new Rash or bruise      Data Review:    CBC Recent Labs  Lab 10/21/17 1331 10/25/17 0604 10/26/17 0501  WBC 9.3 12.3* 13.0*  HGB 11.5* 12.7* 13.8  HCT 39.8 44.1 46.5  PLT 175 198 200  MCV 93.4 91.9 89.8  MCH 27.0 26.5 26.6  MCHC 28.9* 28.8* 29.7*  RDW 16.0* 16.1* 16.4*    Chemistries  Recent Labs  Lab 10/21/17 1331 10/22/17 0553 10/23/17 0620 10/24/17 0717 10/25/17 0604 10/26/17 0501  NA 140 140 145 142 140 138  K 4.0 4.2 4.4 4.4 4.1 3.8  CL 96* 92* 94* 90* 84* 85*  CO2 35* 38* 40* 42* 41* 40*  GLUCOSE 163* 208* 107* 103* 115* 138*  BUN 31* 38* 42* 42* 35* 43*  CREATININE 1.62* 1.73* 1.77* 1.79* 1.43* 1.70*  CALCIUM 8.9 8.9 9.2 9.1 10.0 10.0  MG  --   --  2.2  --  2.1  --   AST 34  --   --   --  29 30  ALT 24  --   --   --  18 18  ALKPHOS 135*  --   --   --  118 122  BILITOT 1.2  --   --    --  1.5* 1.2   ------------------------------------------------------------------------------------------------------------------ No results for input(s): CHOL, HDL, LDLCALC, TRIG, CHOLHDL, LDLDIRECT in the last 72 hours.  Lab Results  Component Value Date   HGBA1C 6.9 (H) 10/25/2017   ------------------------------------------------------------------------------------------------------------------ No results for input(s): TSH, T4TOTAL, T3FREE, THYROIDAB in the last 72 hours.  Invalid input(s): FREET3 ------------------------------------------------------------------------------------------------------------------ No results for input(s): VITAMINB12, FOLATE, FERRITIN, TIBC, IRON, RETICCTPCT in the last 72 hours.  Coagulation profile No results for input(s): INR, PROTIME in the last 168 hours.  No results for input(s): DDIMER in the last 72 hours.  Cardiac Enzymes No results for input(s): CKMB, TROPONINI, MYOGLOBIN in the last 168 hours.  Invalid input(s): CK ------------------------------------------------------------------------------------------------------------------    Component Value Date/Time   BNP 456.6 (H) 10/21/2017 1842    Inpatient Medications  Scheduled Meds: . apixaban  5 mg Oral BID  . arformoterol  15 mcg Nebulization BID  . atorvastatin  40 mg Oral QHS  . budesonide (PULMICORT) nebulizer solution  0.5 mg Nebulization BID  . carvedilol  3.125 mg Oral BID WC  . Chlorhexidine Gluconate Cloth  6 each Topical Q0600  . digoxin  0.125 mg Oral Daily  . famotidine  20 mg Oral QHS  . insulin aspart  0-5 Units Subcutaneous QHS  . insulin aspart  0-9 Units Subcutaneous TID WC  . losartan  12.5 mg Oral Daily  . mouth rinse  15 mL Mouth Rinse BID  . metolazone  2.5 mg Oral Once  . multivitamin with minerals  1 tablet Oral Daily  . mupirocin ointment  1 application Nasal BID  . sodium chloride flush  3 mL Intravenous Q12H  . spironolactone  25 mg Oral Daily  .  thiamine  100 mg Oral Daily  . torsemide  60 mg Oral BID   Continuous Infusions: . sodium chloride    . ampicillin-sulbactam (UNASYN) IV 1.5 g (10/26/17 0628)   PRN Meds:.sodium chloride, acetaminophen, ipratropium-albuterol, ondansetron (ZOFRAN) IV, sodium chloride flush  Micro Results Recent Results (from the past 240 hour(s))  Respiratory Panel by PCR     Status: None   Collection Time: 10/21/17 10:40 PM  Result Value Ref Range Status   Adenovirus NOT DETECTED NOT DETECTED Final   Coronavirus 229E NOT DETECTED NOT DETECTED Final   Coronavirus HKU1 NOT DETECTED NOT DETECTED Final   Coronavirus NL63 NOT DETECTED NOT DETECTED Final   Coronavirus OC43 NOT DETECTED NOT DETECTED Final   Metapneumovirus NOT DETECTED NOT DETECTED Final   Rhinovirus / Enterovirus NOT DETECTED NOT DETECTED Final   Influenza A NOT DETECTED NOT DETECTED Final   Influenza B NOT DETECTED NOT DETECTED Final   Parainfluenza Virus 1 NOT DETECTED NOT DETECTED Final   Parainfluenza Virus 2 NOT DETECTED NOT DETECTED Final   Parainfluenza Virus 3 NOT  DETECTED NOT DETECTED Final   Parainfluenza Virus 4 NOT DETECTED NOT DETECTED Final   Respiratory Syncytial Virus NOT DETECTED NOT DETECTED Final   Bordetella pertussis NOT DETECTED NOT DETECTED Final   Chlamydophila pneumoniae NOT DETECTED NOT DETECTED Final   Mycoplasma pneumoniae NOT DETECTED NOT DETECTED Final    Comment: Performed at [facility], [address] [place], [place] [address]  MRSA PCR Screening     Status: Abnormal   Collection Time: 10/21/17 10:46 PM  Result Value Ref Range Status   MRSA by PCR POSITIVE (A) NEGATIVE Final    Comment:        The GeneXpert MRSA Assay (FDA approved for NASAL specimens only), is one component of a comprehensive MRSA colonization surveillance program. It is not intended to diagnose MRSA infection nor to guide or monitor treatment for MRSA infections. RESULT CALLED TO, READ BACK BY AND VERIFIED WITH: J  CVIJETIC RN 10/22/17 0349 JDW Performed at [facility], [address], [place], [place] [address]     Radiology Reports Dg Chest 2 View  Result Date: 10/21/2017 CLINICAL DATA:  Failure to thrive.  Hypoxia. EXAM: CHEST - 2 VIEW COMPARISON:  03/02/2016 FINDINGS: Bilateral diffuse mild interstitial thickening. Prominence of the central pulmonary vasculature. No focal consolidation, pleural effusion or pneumothorax. Calcified pleural plaque along the left diaphragmatic surface. Stable cardiomegaly. Prior median sternotomy and mitral valve replacement. Cardiac pacemaker is present. No acute osseous abnormality. IMPRESSION: 1. Cardiomegaly with pulmonary vascular congestion. 2. Likely underlying mild chronic interstitial lung disease. Electronically Signed   By: Elige Ko   On: 10/21/2017 14:00   Ct Chest Wo Contrast  Result Date: 10/21/2017 CLINICAL DATA:  Decreased appetite.  Acute respiratory illness. EXAM: CT CHEST WITHOUT CONTRAST TECHNIQUE: Multidetector CT imaging of the chest was performed following the standard protocol without IV contrast. COMPARISON:  CT scan February 07, 2014.  Chest x-ray Oct 21, 2017. FINDINGS: Cardiovascular: Atherosclerotic changes in the nonaneurysmal aorta. The main pulmonary artery measures 3.2 cm. Cardiomegaly noted. Coronary artery calcifications identified. Mediastinum/Nodes: The thyroid and esophagus are normal. Stable shotty nodes in the mediastinum are likely reactive. Chest wall is normal. No effusions. Lungs/Pleura: Central airways are normal. No pneumothorax. Atelectasis in the left lung on axial image 42. Chronic changes in the left base. Focal infiltrate in the right base. Probable pulmonary venous congestion. Opacity in the right middle lobe may represent atelectasis versus infiltrate. No suspicious nodules or masses. Upper Abdomen: Cholelithiasis in the gallbladder. The gallbladder wall is mildly prominent, possibly due to poor distention.  Musculoskeletal: No chest wall mass or suspicious bone lesions identified. IMPRESSION: 1. Marked cardiomegaly and pulmonary venous congestion. 2. Focal infiltrate in the right base and perhaps the right middle lobe is suggestive of pneumonia or aspiration. Recommend follow-up to resolution. 3. Atherosclerotic changes in the nonaneurysmal aorta. Coronary artery calcifications. 4. The main pulmonary artery is mildly dilated measuring 3.2 cm raising the possibility of pulmonary arterial hypertension. 5. Shotty nodes in the mediastinum are likely reactive. 6. Cholelithiasis. The gallbladder wall is mildly prominent, possibly due to poor distention. Recommend clinical correlation. Aortic Atherosclerosis (ICD10-I70.0). Electronically Signed   By: Gerome Sam III M.D   On: 10/21/2017 17:53    Time Spent in minutes  30   Richarda Overlie M.D on 10/26/2017 at 9:36 AM  Between 7am to 7pm -   After 7pm go to [email or web address] - password [facility]  [name] -  Office  [phone]

## 2017-10-26 NOTE — Progress Notes (Signed)
ANTICOAGULATION CONSULT NOTE - Initial Consult  Pharmacy Consult for apixaban to heparin Indication: atrial fibrillation  Allergies  Allergen Reactions  . Warfarin Other (See Comments)    Non compliance and ETOH abuse    Patient Measurements: Height: 5\' 4"  (162.6 cm) Weight: 154 lb 12.2 oz (70.2 kg) IBW/kg (Calculated) : 59.2 Heparin Dosing Weight: 70 kg  Vital Signs: Temp: 97.6 F (36.4 C) (05/21 0817) Temp Source: Axillary (05/21 0817) BP: 139/88 (05/21 0817) Pulse Rate: 66 (05/21 0817)  Labs: Recent Labs    10/24/17 0717 10/25/17 0604 10/26/17 0501  HGB  --  12.7* 13.8  HCT  --  44.1 46.5  PLT  --  198 200  CREATININE 1.79* 1.43* 1.70*    Estimated Creatinine Clearance: 33.9 mL/min (A) (by C-G formula based on SCr of 1.7 mg/dL (H)).   Medical History: Past Medical History:  Diagnosis Date  . AICD (automatic cardioverter/defibrillator) present   . Alcohol abuse   . Anxiety   . Bipolar disorder (HCC)   . Biventricular automatic implantable cardioverter defibrillator in situ    a. 01/2014 s/p MDT DTBA1D1 Viva XT CRT-D (ser # [id number] H).  . CAD (coronary artery disease)    a. s/p prior PCI/stenting of the LCX;  b. 08/2014 MV: EF 20%, large septal, apical, and inferior infarct from apex to base, no ischemia;  c. 08/2014 NSTEMI/Cath: LM mod distal dzs extending into ostial LCX (80%), LAD tortuous, RI nl, OM mod dzs, RCA 100 CTO with R->R and L->R collats-->Med Rx.  . Chronic atrial fibrillation (HCC)    a. CHA2DS2VASc = 6->eliquis;  b. S/P AVN RFCA an BiV ICD placement.  . Chronic respiratory failure (HCC)   . Chronic systolic congestive heart failure (HCC)    a. 01/2014 Echo: EF 25-30%, mod conc LVH, mod dil LA.  . CKD (chronic kidney disease)    a. suspect stage II-III based on historical labs.  . Complete heart block (HCC)    a. In setting of prior AV nodal ablation r/t afib-->BiV ICD (01/2014).  . COPD (chronic obstructive pulmonary disease) (HCC)   . CVA  (cerebral vascular accident) (HCC)    a. Multiple prior embolic strokes.  . Dementia   . Depression   . DVT (deep venous thrombosis) (HCC)   . Dyslipidemia   . Hyperglycemia   . Hypertension   . Mitral valve disease    a. remote mitral replacement with Bjork Shiley valve;  b. 06/2006 Redo MVR with tissue valve.  . Mixed Ischemic and Nonischemic Cardiomyopathy    a. 8/.2015 Echo: Ef 25-30%;  b. 01/2014 s/p MDT [id number] Talbot Grumbling XT CRT-D (ser # [id number] H).  . On home oxygen therapy    "2L; 24/7" (65/16/2019)  . Psoriasis    "back of his head; elbows; finger of left hand" (06/07/2017)  . Ventral hernia     Assessment: 81 yoM admitted with SOB now with worsening respiratory status requiring intubation. Pt on apixaban PTA and inpatient for hx AFib now to transition to heparin. Last dose of apixaban last night at 2122, CT head negative.   Goal of Therapy:  Heparin Level 0.3-0.7 Monitor platelets by anticoagulation protocol: Yes   Plan:  -Heparin 950 units/hr -8-hr aPTT/heparin level -Monitor daily aPTT/heparin level until correlating, CBC, S/Sx bleeding  Fredonia Highland, PharmD, BCPS PGY-2 Cardiology Pharmacy Resident Pager: [phone] 10/26/2017

## 2017-10-26 NOTE — Progress Notes (Signed)
SLP Cancellation Note  Patient Details Name: [name] MRN: [id number] DOB: 1945-12-30   Cancelled treatment:       Reason Eval/Treat Not Completed: Medical issues which prohibited therapy. Pt with medical decline, transferred to ICU and intubated.   Maxcine Ham 10/26/2017, 10:51 AM  Maxcine Ham, M.A. CCC-SLP [phone]
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
## 2017-10-26 NOTE — Progress Notes (Addendum)
Patient ID: [name], male   DOB: Apr 11, 1946, 72 y.o.   MRN: [id number]     Advanced Heart Failure Rounding Note  PCP-Cardiologist: No primary care provider on file.   Subjective:    ABG 5/19 => 7.29/100/72.  Bipap started, repeat ABG 7.43/73/83.    On BiPAP this am. Grunts to pain, otherwise minimally responsive.    STAT ABG pending.   Negative 2.6 L and down 7 lbs, ? Accuracy.   Objective:   Weight Range: 154 lb 12.2 oz (70.2 kg) Body mass index is 26.57 kg/m.   Vital Signs:   Temp:  [97.6 F (36.4 C)-98.7 F (37.1 C)] 97.6 F (36.4 C) (05/21 0817) Pulse Rate:  [58-66] 66 (05/21 0817) Resp:  [17-24] 17 (05/21 0817) BP: (115-156)/(56-88) 139/88 (05/21 0817) SpO2:  [91 %-96 %] 95 % (05/21 0817) FiO2 (%):  [40 %] 40 % (05/20 2349) Weight:  [154 lb 12.2 oz (70.2 kg)] 154 lb 12.2 oz (70.2 kg) (05/21 0549) Last BM Date: 10/24/17(per documentation in Intake/Output flowsheet )  Weight change: Filed Weights   10/24/17 0500 10/25/17 0500 10/26/17 0549  Weight: 164 lb 6.4 oz (74.6 kg) 161 lb 6 oz (73.2 kg) 154 lb 12.2 oz (70.2 kg)    Intake/Output:   Intake/Output Summary (Last 24 hours) at 10/26/2017 0826 Last data filed at 10/26/2017 0700 Gross per 24 hour  Intake 880 ml  Output 3525 ml  Net -2645 ml      Physical Exam    General: On BiPAP.   HEENT: On BiPAP Neck: Supple. JVP ~8-9 cm. Carotids 2+ bilat; no bruits. No thyromegaly or nodule noted. Cor: PMI nondisplaced. RRR, No M/G/R noted Lungs: Diminished basilar sounds Abdomen: Soft, non-tender, non-distended, no HSM. No bruits or masses. +BS  Extremities: No cyanosis, clubbing, or rash. Trace ankle edema.  Neuro: Grunts to painful stimuli.   Telemetry   Afib with BiV pacing, personally reviewed.   Labs    CBC Recent Labs    10/25/17 0604 10/26/17 0501  WBC 12.3* 13.0*  HGB 12.7* 13.8  HCT 44.1 46.5  MCV 91.9 89.8  PLT 198 200   Basic Metabolic Panel Recent Labs    23/76/28 0604  10/26/17 0501  NA 140 138  K 4.1 3.8  CL 84* 85*  CO2 41* 40*  GLUCOSE 115* 138*  BUN 35* 43*  CREATININE 1.43* 1.70*  CALCIUM 10.0 10.0  MG 2.1  --    Liver Function Tests Recent Labs    10/25/17 0604 10/26/17 0501  AST 29 30  ALT 18 18  ALKPHOS 118 122  BILITOT 1.5* 1.2  PROT 7.6 8.2*  ALBUMIN 3.6 3.4*   No results for input(s): LIPASE, AMYLASE in the last 72 hours. Cardiac Enzymes No results for input(s): CKTOTAL, CKMB, CKMBINDEX, TROPONINI in the last 72 hours.  BNP: BNP (last 3 results) Recent Labs    02/01/17 1054 06/06/17 1037 10/21/17 1842  BNP 102.6* 486.4* 456.6*    ProBNP (last 3 results) Recent Labs    05/25/17 1152  PROBNP 1,108*     D-Dimer No results for input(s): DDIMER in the last 72 hours. Hemoglobin A1C Recent Labs    10/25/17 0604  HGBA1C 6.9*   Fasting Lipid Panel No results for input(s): CHOL, HDL, LDLCALC, TRIG, CHOLHDL, LDLDIRECT in the last 72 hours. Thyroid Function Tests No results for input(s): TSH, T4TOTAL, T3FREE, THYROIDAB in the last 72 hours.  Invalid input(s): FREET3  Other results:   Imaging  No results found.   Medications:     Scheduled Medications: . apixaban  5 mg Oral BID  . arformoterol  15 mcg Nebulization BID  . atorvastatin  40 mg Oral QHS  . budesonide (PULMICORT) nebulizer solution  0.5 mg Nebulization BID  . carvedilol  3.125 mg Oral BID WC  . Chlorhexidine Gluconate Cloth  6 each Topical Q0600  . digoxin  0.125 mg Oral Daily  . famotidine  20 mg Oral QHS  . furosemide  80 mg Intravenous Q12H  . insulin aspart  0-5 Units Subcutaneous QHS  . insulin aspart  0-9 Units Subcutaneous TID WC  . losartan  12.5 mg Oral Daily  . mouth rinse  15 mL Mouth Rinse BID  . metolazone  2.5 mg Oral Once  . multivitamin with minerals  1 tablet Oral Daily  . mupirocin ointment  1 application Nasal BID  . sodium chloride flush  3 mL Intravenous Q12H  . spironolactone  25 mg Oral Daily  . thiamine   100 mg Oral Daily    Infusions: . sodium chloride    . ampicillin-sulbactam (UNASYN) IV 1.5 g (10/26/17 0628)    PRN Medications: sodium chloride, acetaminophen, ipratropium-albuterol, ondansetron (ZOFRAN) IV, sodium chloride flush    Assessment/Plan   1. Acute on chronic systolic CHF: ECHO 05/23/15 EF 20-25% Mixed ischemic/nonischemic (ETOH) cardiomyopathy. S/p Med[employer]onic CRT-D. Echo this admission with EF 30-35%.  - Diuresed well and weight down, but minimally responsive this am.  - Stat ABG pending.  - Continue lasix 80 mg IV BID today.  - Continue digoxin 0.125 daily.   -Can continue Coreg at lower dose 3.125 mg bid.  - Continue spironolactone 25 mg daily.  - Creatinine stable, losartan 12.5 mg daily ordered.   2. Atrial fibrillation:Chronic,s/p AV nodal ablation. - Continue Eliquis for anticoagulation. - No change to current plan.   3. PAD:No claudication symptoms.  - Follows at [facility].  - No change to current plan.   4. CAD: - No s/s of ischemia.   -Severe LM stenosis found 1/19 =>not a CABG candidate due to severe COPD and not PCI candidate due to inability to use Impella due to severe PAD (have reviewed extensively with interventional cardiology). - No ASA with stable CAD and need for Eliquis.  - Continue statin.  5. Mitral valve replacement: - Bioprosthetic valve currently, originally Bjork-Shiley. Valve looked ok onecho this admission.  - No change to current plan.   6. ETOH abuse: - Says he has quit drinking. - No change to current plan.   7. Smoking/COPD:On home oxygen, severe.  - Still smoking some. Needs complete cessation.  8. CKD: Stage 3 - Creatinine at baseline.  9. Pulmonary: CT chest with RLL/RML infiltrate but PCT not elevated and no fever/WBC elevation.  Possible aspiration.  Not on antibiotics.  10. Neuro: Baseline bipolar disorder and dementia.  Family says he "sleeps all the time" at home.  Suspect hypercarbia in setting of severe  COPD is the cause of this.  Delirium in the hospital.  11. Acute/chronic hypercarbic respiratory failure:  - Has severe COPD on home oxygen at baseline.  Suspect he needs CPAP versus Bipap at home.  Lethargy at home is likely due to hypercarbia.   - STAT ABG pending. He is minimally responsive this am. On BiPAP.  - Pulmonary following.   Minimally responsive this am. On BIPAP. ABG pending. Do not think he would have a very good outcome if intubated, but may be headed that  direction.  Likely need GOC when family comes. Prognosis guarded.   Length of Stay: 5  Luane School  10/26/2017, 8:26 AM  Advanced Heart Failure Team Pager [phone] (M-F; 7a - 4p)  Please contact [facility] for night-coverage after hours (4p -7a ) and weekends on [email or web address]  Patient seen with PA, agree with the above note.  He was minimally responsive when seen this morning, would awaken but promptly fall asleep.  He has been on Bipap.  ABG 7.41/64/98, which is actually somewhat better than prior, so surprising that he is so altered/lethargic.  CT head was done, showing old CVA nothing acute seen. PCT not elevated (<0.1).   - He has been intubated for airway protection.  - Check NH3 level.   Volume status looks overall improved.  Creatinine up to 1.7.   - Stop IV Lasix, start torsemide 60 mg bid.  - Now that he is intubated in CCU, will place PICC to follow CVP.   General prognosis is poor.  Would address goals of care with family.  They have wanted to be aggressive so far.   Marca Ancona 10/26/2017 12:27 PM

## 2017-10-26 NOTE — Progress Notes (Signed)
Peripherally Inserted Central Catheter/Midline Placement  The IV Nurse has discussed with the patient and/or persons authorized to consent for the patient, the purpose of this procedure and the potential benefits and risks involved with this procedure.  The benefits include less needle sticks, lab draws from the catheter, and the patient may be discharged home with the catheter. Risks include, but not limited to, infection, bleeding, blood clot (thrombus formation), and puncture of an artery; nerve damage and irregular heartbeat and possibility to perform a PICC exchange if needed/ordered by physician.  Alternatives to this procedure were also discussed.  Bard Power PICC patient education guide, fact sheet on infection prevention and patient information card has been provided to patient /or left at bedside.    PICC/Midline Placement Documentation  PICC Double Lumen 10/26/17 PICC Right Brachial 40 cm 0 cm (Active)  Indication for Insertion or Continuance of Line Prolonged intravenous therapies 10/26/2017  1:00 PM  Exposed Catheter (cm) 0 cm 10/26/2017  1:00 PM  Site Assessment Clean;Dry;Intact 10/26/2017  1:00 PM  Lumen #1 Status Flushed;Blood return noted 10/26/2017  1:00 PM  Lumen #2 Status Flushed;Blood return noted 10/26/2017  1:00 PM  Dressing Type Transparent 10/26/2017  1:00 PM  Dressing Status Clean;Dry;Intact;Antimicrobial disc in place 10/26/2017  1:00 PM  Dressing Intervention New dressing 10/26/2017  1:00 PM  Dressing Change Due 11/02/17 10/26/2017  1:00 PM   Consent signed by daughter at bedside    [name] 10/26/2017, 1:51 PM

## 2017-10-26 NOTE — Progress Notes (Addendum)
Pt transported on vent to CT and returned to 2H13.  Vitals remained stable throughout.  ABG values given to NP.  RR changed to 12, FIO2 titrated to 60%.

## 2017-10-26 NOTE — Progress Notes (Signed)
Upon assessment this morning patient was found to be difficult to arouse and not following commands. Pt was momentarily able to open eyes and grip hands. CT of head was ordered due to lethargy and change in LOC. Critical care, RR, and RRT arrived at bedside and felt that pt needed intubation prior to CT scan. Pt transferred ICU, report given to ICU charge RN.

## 2017-10-26 NOTE — Procedures (Signed)
Intubation Procedure Note [name] [id number] February 20, 1946  Procedure: Intubation Indications: Airway protection and maintenance  Procedure Details Consent: Risks of procedure as well as the alternatives and risks of each were explained to the (patient/caregiver).  Consent for procedure obtained. Time Out: Verified patient identification, verified procedure, site/side was marked, verified correct patient position, special equipment/implants available, medications/allergies/relevent history reviewed, required imaging and test results available.  Performed  Maximum sterile technique was used including antiseptics, cap, gloves, gown, hand hygiene, mask and sheet.  4    Evaluation Hemodynamic Status: BP stable throughout; O2 sats: stable throughout Patient's Current Condition: stable Complications: No apparent complications Patient did tolerate procedure well. Chest X-ray ordered to verify placement.  CXR: pending.  [name] ACNP-BC [facility] Pulmonary/Critical Care Pager # [phone] OR # [phone] if no answer  [name] 10/26/2017

## 2017-10-26 NOTE — Progress Notes (Addendum)
ANTICOAGULATION CONSULT NOTE  Pharmacy Consult for heparin Indication: atrial fibrillation  Allergies  Allergen Reactions  . Warfarin Other (See Comments)    Non compliance and ETOH abuse    Patient Measurements: Height: 5\' 4"  (162.6 cm) Weight: 154 lb 12.2 oz (70.2 kg) IBW/kg (Calculated) : 59.2 Heparin Dosing Weight: 70 kg  Vital Signs: Temp: 98.4 F (36.9 C) (05/21 2000) Temp Source: Oral (05/21 2000) BP: 92/61 (05/21 1800) Pulse Rate: 58 (05/21 1800)  Labs: Recent Labs    10/24/17 0717 10/25/17 0604 10/26/17 0501 10/26/17 2100  HGB  --  12.7* 13.8  --   HCT  --  44.1 46.5  --   PLT  --  198 200  --   APTT  --   --   --  100*  CREATININE 1.79* 1.43* 1.70*  --     Estimated Creatinine Clearance: 33.9 mL/min (A) (by C-G formula based on SCr of 1.7 mg/dL (H)).   Assessment: 72 y.o. male with h/o Afib, Eliquis on hold, for heparin  Goal of Therapy:  APTT 66-102 Heparin Level 0.3-0.7 Monitor platelets by anticoagulation protocol: Yes   Plan:  Continue Heparin at current rate  Follow-up am labs.   Geannie Risen, PharmD, BCPS  10/26/2017   Addendum: Recheck APTT this morning 131 Decrease Heparin 800 units/hr Check heparin level in 8 hours.   Geannie Risen, PharmD, BCPS  10/27/2017 6:41 AM

## 2017-10-26 NOTE — Progress Notes (Signed)
EEG Completed; Results Pending  

## 2017-10-26 NOTE — Progress Notes (Signed)
RN called reports Cindee Lame NP wanting pt moved to ICU to be intubated and then transported to CT. Pete NP is comfortable with pt being intubated after transferred to ICU. I was currently with another respiratory distress patient. Arrived just as patient received bed 2h13, assisted with transfer. No interventions from RRT

## 2017-10-26 NOTE — Progress Notes (Signed)
PT Cancellation Note  Patient Details Name: [name] MRN: [id number] DOB: 30-Jun-1945   Cancelled Treatment:    Reason Eval/Treat Not Completed: Medical issues which prohibited therapy. Pt with medical decline and transferred to ICU.   [name] 10/26/2017, 10:08 AM Skip Mayer PT [phone]

## 2017-10-27 ENCOUNTER — Ambulatory Visit (HOSPITAL_COMMUNITY): Payer: Medicare Other

## 2017-10-27 ENCOUNTER — Inpatient Hospital Stay (HOSPITAL_COMMUNITY): Payer: Medicare Other

## 2017-10-27 LAB — COMPREHENSIVE METABOLIC PANEL
ALBUMIN: 3.4 g/dL — AB (ref 3.5–5.0)
ALK PHOS: 115 U/L (ref 38–126)
ALT: 17 U/L (ref 17–63)
ANION GAP: 16 — AB (ref 5–15)
AST: 29 U/L (ref 15–41)
BILIRUBIN TOTAL: 1.4 mg/dL — AB (ref 0.3–1.2)
BUN: 50 mg/dL — AB (ref 6–20)
CALCIUM: 9.7 mg/dL (ref 8.9–10.3)
CO2: 36 mmol/L — ABNORMAL HIGH (ref 22–32)
Chloride: 88 mmol/L — ABNORMAL LOW (ref 101–111)
Creatinine, Ser: 1.71 mg/dL — ABNORMAL HIGH (ref 0.61–1.24)
GFR calc Af Amer: 45 mL/min — ABNORMAL LOW (ref >60)
GFR calc non Af Amer: 39 mL/min — ABNORMAL LOW (ref >60)
GLUCOSE: 130 mg/dL — AB (ref 65–99)
Potassium: 3.8 mmol/L (ref 3.5–5.1)
Sodium: 140 mmol/L (ref 135–145)
TOTAL PROTEIN: 8 g/dL (ref 6.5–8.1)

## 2017-10-27 LAB — GLUCOSE, CAPILLARY
GLUCOSE-CAPILLARY: 127 mg/dL — AB (ref 65–99)
GLUCOSE-CAPILLARY: 131 mg/dL — AB (ref 65–99)
GLUCOSE-CAPILLARY: 194 mg/dL — AB (ref 65–99)
Glucose-Capillary: 137 mg/dL — ABNORMAL HIGH (ref 65–99)
Glucose-Capillary: 121 mg/dL — ABNORMAL HIGH (ref 65–99)
Glucose-Capillary: 118 mg/dL — ABNORMAL HIGH (ref 65–99)
Glucose-Capillary: 204 mg/dL — ABNORMAL HIGH (ref 65–99)

## 2017-10-27 LAB — CBC
HEMATOCRIT: 46 % (ref 39.0–52.0)
HEMOGLOBIN: 14.1 g/dL (ref 13.0–17.0)
MCH: 27 pg (ref 26.0–34.0)
MCHC: 30.7 g/dL (ref 30.0–36.0)
MCV: 88.1 fL (ref 78.0–100.0)
Platelets: 202 10*3/uL (ref 150–400)
RBC: 5.22 MIL/uL (ref 4.22–5.81)
RDW: 16.6 % — ABNORMAL HIGH (ref 11.5–15.5)
WBC: 12.6 10*3/uL — ABNORMAL HIGH (ref 4.0–10.5)

## 2017-10-27 LAB — APTT
APTT: 131 s — AB (ref 24–36)
APTT: 137 s — AB (ref 24–36)

## 2017-10-27 LAB — HEPARIN LEVEL (UNFRACTIONATED)
Heparin Unfractionated: >2.2 IU/mL — ABNORMAL HIGH (ref 0.30–0.70)
Heparin Unfractionated: >2.2 IU/mL — ABNORMAL HIGH (ref 0.30–0.70)

## 2017-10-27 MED ORDER — CHLORHEXIDINE GLUCONATE 0.12% ORAL RINSE (MEDLINE KIT)
15.0000 mL | Freq: Two times a day (BID) | OROMUCOSAL | Status: DC
  Administered 2017-10-27 – 2017-10-28 (×2): 15 mL via OROMUCOSAL

## 2017-10-27 MED ORDER — VITAL HIGH PROTEIN PO LIQD: 1000.0000 mL | ORAL | Status: DC

## 2017-10-27 MED ORDER — VITAL AF 1.2 CAL PO LIQD
1000.0000 mL | ORAL | Status: DC
  Administered 2017-10-27: 1000 mL

## 2017-10-27 MED ORDER — SENNOSIDES-DOCUSATE SODIUM 8.6-50 MG PO TABS
1.0000 | ORAL_TABLET | Freq: Every day | ORAL | Status: DC
Start: 2017-10-27 — End: 2017-10-29
  Administered 2017-10-27 – 2017-10-28 (×2): 1
  Filled 2017-10-27 (×2): qty 1

## 2017-10-27 MED ORDER — ORAL CARE MOUTH RINSE
15.0000 mL | OROMUCOSAL | Status: DC
  Administered 2017-10-27 – 2017-10-28 (×12): 15 mL via OROMUCOSAL

## 2017-10-27 MED ORDER — LACTULOSE 10 GM/15ML PO SOLN
30.0000 g | Freq: Three times a day (TID) | ORAL | Status: DC
  Administered 2017-10-27 – 2017-10-28 (×4): 30 g
  Filled 2017-10-27 (×4): qty 45

## 2017-10-27 MED ORDER — PRO-STAT SUGAR FREE PO LIQD: 30.0000 mL | Freq: Two times a day (BID) | ORAL | Status: DC

## 2017-10-27 MED ORDER — PRO-STAT SUGAR FREE PO LIQD
30.0000 mL | Freq: Three times a day (TID) | ORAL | Status: DC
  Administered 2017-10-27 – 2017-10-28 (×2): 30 mL
  Filled 2017-10-27 (×2): qty 30

## 2017-10-27 MED ORDER — LACTULOSE 10 GM/15ML PO SOLN: 30.0000 g | Freq: Two times a day (BID) | ORAL | Status: DC
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
## 2017-10-27 NOTE — Progress Notes (Signed)
Per RN, CCM requests pt be placed back on full vent support currently d/t pt not extubating today.  No distress noted, pt appears to be tol vent wean well currently.  Pt placed back on full vent support per request.  RN aware.

## 2017-10-27 NOTE — Progress Notes (Signed)
ANTICOAGULATION CONSULT NOTE - Follow-Up Consult  Pharmacy Consult for apixaban to heparin Indication: atrial fibrillation  Allergies  Allergen Reactions  . Warfarin Other (See Comments)    Non compliance and ETOH abuse    Patient Measurements: Height: 5\' 4"  (162.6 cm) Weight: 153 lb 7 oz (69.6 kg) IBW/kg (Calculated) : 59.2 Heparin Dosing Weight: 70 kg  Vital Signs: Temp: 98.4 F (36.9 C) (05/22 0827) Temp Source: Oral (05/22 0827) BP: 117/70 (05/22 1400) Pulse Rate: 60 (05/22 1400)  Labs: Recent Labs    10/25/17 0604 10/26/17 0501 10/26/17 2100 10/27/17 0529 10/27/17 0532 10/27/17 1417  HGB 12.7* 13.8  --   --  14.1  --   HCT 44.1 46.5  --   --  46.0  --   PLT 198 200  --   --  202  --   APTT  --   --  100* 131*  --  137*  HEPARINUNFRC  --   --  >2.20* >2.20*  --   --   CREATININE 1.43* 1.70*  --   --  1.71*  --     Estimated Creatinine Clearance: 33.7 mL/min (A) (by C-G formula based on SCr of 1.71 mg/dL (H)).   Medical History: Past Medical History:  Diagnosis Date  . AICD (automatic cardioverter/defibrillator) present   . Alcohol abuse   . Anxiety   . Bipolar disorder (HCC)   . Biventricular automatic implantable cardioverter defibrillator in situ    a. 01/2014 s/p MDT DTBA1D1 Viva XT CRT-D (ser # [id number]).  . CAD (coronary artery disease)    a. s/p prior PCI/stenting of the LCX;  b. 08/2014 MV: EF 20%, large septal, apical, and inferior infarct from apex to base, no ischemia;  c. 08/2014 NSTEMI/Cath: LM mod distal dzs extending into ostial LCX (80%), LAD tortuous, RI nl, OM mod dzs, RCA 100 CTO with R->R and L->R collats-->Med Rx.  . Chronic atrial fibrillation (HCC)    a. CHA2DS2VASc = 6->eliquis;  b. S/P AVN RFCA an BiV ICD placement.  . Chronic respiratory failure (HCC)   . Chronic systolic congestive heart failure (HCC)    a. 01/2014 Echo: EF 25-30%, mod conc LVH, mod dil LA.  . CKD (chronic kidney disease)    a. suspect stage II-III based on  historical labs.  . Complete heart block (HCC)    a. In setting of prior AV nodal ablation r/t afib-->BiV ICD (01/2014).  . COPD (chronic obstructive pulmonary disease) (HCC)   . CVA (cerebral vascular accident) (HCC)    a. Multiple prior embolic strokes.  . Dementia   . Depression   . DVT (deep venous thrombosis) (HCC)   . Dyslipidemia   . Hyperglycemia   . Hypertension   . Mitral valve disease    a. remote mitral replacement with Bjork Shiley valve;  b. 06/2006 Redo MVR with tissue valve.  . Mixed Ischemic and Nonischemic Cardiomyopathy    a. 8/.2015 Echo: Ef 25-30%;  b. 01/2014 s/p MDT [id number] [employer]bot Grumbling XT CRT-D (ser # [id number]).  . On home oxygen therapy    "2L; 24/7" (65/16/2019)  . Psoriasis    "back of his head; elbows; finger of left hand" (06/07/2017)  . Ventral hernia     Assessment: 61 yoM admitted with SOB now with worsening respiratory status requiring intubation. Pt on apixaban PTA and inpatient for hx AFib now to transition to heparin. Heparin level remains falsely elevated due to recent apixaban, aPTT elevated as well at 137 seconds. Heparin  infusing correctly per RN.   Goal of Therapy:  Heparin Level 0.3-0.7 APTT 66-102s Monitor platelets by anticoagulation protocol: Yes   Plan:  -Heparin to 650 units/hr -8-hr aPTT -Monitor daily aPTT/heparin level until correlating, CBC, S/Sx bleeding  Fredonia Highland, PharmD, BCPS PGY-2 Cardiology Pharmacy Resident Pager: [phone] 10/27/2017

## 2017-10-27 NOTE — Progress Notes (Signed)
Son updated at bedside in detail.  Reviewed current status and plan of care going forward.  He is concerned about his home regimen / any meds that would contribute to sleepiness.    Canary Brim, NP-C [facility] Pgr: [phone] or if no answer [phone] 10/27/2017, 2:30 PM

## 2017-10-27 NOTE — Progress Notes (Addendum)
Patient ID: [name], male   DOB: 1945-06-22, 72 y.o.   MRN: [id number]     Advanced Heart Failure Rounding Note  PCP-Cardiologist: No primary care provider on file.   Subjective:    ABG 5/19 => 7.29/100/72.  Bipap started, repeat ABG 7.43/73/83.    Decompensated 10/26/17 with AMS although PCO2 was actually a bit lower than the prior day. Intubated for airway protection and moved to ICU. NH3 58.  EEG with diffuse cerebral dysfunction.   Intubated, but awakens. FiO2 40%, PEEP 5. CVP 3  EEG 10/26/17 1. Moderate diffuse background slowing with occasional triphasic waves 2. Occasional epileptiform discharges over the left midtemporal and left occipital regions ? Metabolic encephalopathy.  Objective:   Weight Range: 153 lb 7 oz (69.6 kg) Body mass index is 26.34 kg/m.   Vital Signs:   Temp:  [97.1 F (36.2 C)-99.3 F (37.4 C)] 98.9 F (37.2 C) (05/22 0437) Pulse Rate:  [39-66] 59 (05/22 0700) Resp:  [12-28] 16 (05/22 0700) BP: (91-167)/(55-104) 120/71 (05/22 0747) SpO2:  [90 %-100 %] 95 % (05/22 0747) FiO2 (%):  [40 %-100 %] 40 % (05/22 0747) Weight:  [153 lb 7 oz (69.6 kg)] 153 lb 7 oz (69.6 kg) (05/22 0445) Last BM Date: 10/24/17  Weight change: Filed Weights   10/25/17 0500 10/26/17 0549 10/27/17 0445  Weight: 161 lb 6 oz (73.2 kg) 154 lb 12.2 oz (70.2 kg) 153 lb 7 oz (69.6 kg)    Intake/Output:   Intake/Output Summary (Last 24 hours) at 10/27/2017 0815 Last data filed at 10/27/2017 0700 Gross per 24 hour  Intake 799.57 ml  Output 475 ml  Net 324.57 ml      Physical Exam    General: Intubated. Awakens.   HEENT: + ETT Neck: Supple. JVP ~8. Carotids 2+ bilat; no bruits. No thyromegaly or nodule noted. Cor: PMI nondisplaced. RRR, No M/G/R noted Lungs: Diminished basilar sounds Abdomen: Soft, non-tender, non-distended, no HSM. No bruits or masses. +BS  Extremities: No cyanosis, clubbing, or rash. Trace ankle edema.  Neuro: Intubated. Awakens to verbal  stimuli.   Telemetry   Afib with BiV pacing, personally reviewed.   Labs    CBC Recent Labs    10/26/17 0501 10/27/17 0532  WBC 13.0* 12.6*  HGB 13.8 14.1  HCT 46.5 46.0  MCV 89.8 88.1  PLT 200 544   Basic Metabolic Panel Recent Labs    10/25/17 0604 10/26/17 0501 10/27/17 0532  NA 140 138 140  K 4.1 3.8 3.8  CL 84* 85* 88*  CO2 41* 40* 36*  GLUCOSE 115* 138* 130*  BUN 35* 43* 50*  CREATININE 1.43* 1.70* 1.71*  CALCIUM 10.0 10.0 9.7  MG 2.1  --   --    Liver Function Tests Recent Labs    10/26/17 0501 10/27/17 0532  AST 30 29  ALT 18 17  ALKPHOS 122 115  BILITOT 1.2 1.4*  PROT 8.2* 8.0  ALBUMIN 3.4* 3.4*   No results for input(s): LIPASE, AMYLASE in the last 72 hours. Cardiac Enzymes No results for input(s): CKTOTAL, CKMB, CKMBINDEX, TROPONINI in the last 72 hours.  BNP: BNP (last 3 results) Recent Labs    02/01/17 1054 06/06/17 1037 10/21/17 1842  BNP 102.6* 486.4* 456.6*    ProBNP (last 3 results) Recent Labs    05/25/17 1152  PROBNP 1,108*     D-Dimer No results for input(s): DDIMER in the last 72 hours. Hemoglobin A1C Recent Labs    10/25/17 0604  HGBA1C 6.9*   Fasting Lipid Panel No results for input(s): CHOL, HDL, LDLCALC, TRIG, CHOLHDL, LDLDIRECT in the last 72 hours. Thyroid Function Tests Recent Labs    10/26/17 1537  TSH 2.702    Other results:   Imaging    Ct Head Wo Contrast  Result Date: 10/26/2017 CLINICAL DATA:  Altered mental status. EXAM: CT HEAD WITHOUT CONTRAST TECHNIQUE: Contiguous axial images were obtained from the base of the skull through the vertex without intravenous contrast. COMPARISON:  CT head dated July 19, 2013. FINDINGS: Brain: Multiple old infarcts involving the right frontal lobe, left temporal and parietal lobes, right insula, right basal ganglia, and bilateral cerebellar hemispheres, overall similar to prior study. No evidence of acute infarction, hemorrhage, hydrocephalus,  extra-axial collection or mass lesion/mass effect. Stable atrophy and chronic microvascular ischemic changes. Vascular: Atherosclerotic vascular calcification of the carotid siphons. No hyperdense vessel. Skull: Negative for fracture or focal lesion. Sinuses/Orbits: No acute finding. Minimal opacification of the right mastoid air cells. Other: None. IMPRESSION: 1. No acute intracranial abnormality. Multiple old bilateral cerebral and cerebellar infarcts are unchanged. Electronically Signed   By: Titus Dubin M.D.   On: 10/26/2017 12:06   Dg Chest Port 1 View  Result Date: 10/26/2017 CLINICAL DATA:  Post right PICC line placement EXAM: PORTABLE CHEST 1 VIEW COMPARISON:  10/26/2017 FINDINGS: Right PICC line is in place. The tip is in the right atrium approximately 4 cm below the cavoatrial junction. Left AICD remains unchanged. Endotracheal tube and NG tube are unchanged. Cardiomegaly. Bilateral perihilar and lower lobe opacities have increased since prior study. Increasing small to moderate left effusion. Calcified pleural plaques along the left hemidiaphragm. IMPRESSION: Worsening perihilar and lower lobe airspace opacities, likely edema. Small to moderate left effusion has increased since prior study. Right PICC line tip is in the right atrium approximately 4 cm below the cavoatrial junction. Electronically Signed   By: Rolm Baptise M.D.   On: 10/26/2017 14:29   Dg Chest Port 1 View  Result Date: 10/26/2017 CLINICAL DATA:  Evaluate endotracheal tube and OG tube placement EXAM: PORTABLE CHEST 1 VIEW COMPARISON:  CT chest of 10/21/2017 and chest x-ray of the same date FINDINGS: The tip of the endotracheal tube is approximately 4.1 cm above the carina. The OG tube extends below the hemidiaphragm. There is little change in cardiomegaly and pulmonary vascular congestion. AICD leads remain. IMPRESSION: 1. Tip of endotracheal tube 4.1 cm above the carina. 2. OG tube extends into the stomach. 3. Little change in  cardiomegaly and pulmonary vascular congestion. Electronically Signed   By: Ivar Drape M.D.   On: 10/26/2017 10:58   [place] Ekg Site Rite  Result Date: 10/26/2017 If Site Rite image not attached, placement could not be confirmed due to current cardiac rhythm.    Medications:     Scheduled Medications: . arformoterol  15 mcg Nebulization BID  . atorvastatin  40 mg Per Tube QHS  . budesonide (PULMICORT) nebulizer solution  0.5 mg Nebulization BID  . carvedilol  3.125 mg Per Tube BID WC  . chlorhexidine gluconate (MEDLINE KIT)  15 mL Mouth Rinse BID  . Chlorhexidine Gluconate Cloth  6 each Topical Q0600  . digoxin  0.125 mg Per Tube Daily  . famotidine  20 mg Per Tube QHS  . insulin aspart  0-9 Units Subcutaneous Q4H  . losartan  12.5 mg Per Tube Daily  . mouth rinse  15 mL Mouth Rinse QID  . multivitamin with minerals  1 tablet  Per Tube Daily  . mupirocin ointment  1 application Nasal BID  . sodium chloride flush  10-40 mL Intracatheter Q12H  . sodium chloride flush  3 mL Intravenous Q12H  . spironolactone  25 mg Per Tube Daily  . thiamine  100 mg Per Tube Daily  . torsemide  60 mg Per Tube BID    Infusions: . sodium chloride    . ampicillin-sulbactam (UNASYN) IV Stopped (10/27/17 0711)  . heparin 800 Units/hr (10/27/17 0733)  . lactated ringers      PRN Medications: sodium chloride, fentaNYL (SUBLIMAZE) injection, fentaNYL (SUBLIMAZE) injection, ipratropium-albuterol, sennosides, sodium chloride flush, sodium chloride flush    Assessment/Plan   1. Acute on chronic systolic CHF: ECHO 43/32/95 EF 20-25% Mixed ischemic/nonischemic (ETOH) cardiomyopathy. S/p Medtronic CRT-D. Echo this admission with EF 30-35%. BUN/creatinine trending up with CVP 3.  - Volume status low.  - Stop torsemide  - Stop losartan.  - Continue digoxin 0.125 daily.   - Continue Coreg 3.125 mg BID - Continue spironolactone 25 mg daily.  2. Acute on chronic hypercarbic resp failure - Intubated.    - FiO2 40% and PEEP 5. Appreciate CCM care.  - CT chest with RLL/RML infiltrate but PCT not elevated and no fever/WBC elevation.  Possible aspiration.  Not on antibiotics.  - Has severe COPD on home oxygen at baseline.  Suspect he needs CPAP versus Bipap at home.  Lethargy at home is likely due to hypercarbia.   3. Atrial fibrillation:Chronic,s/p AV nodal ablation. - Eliquis held and on heparin gtt.  4. PAD:No claudication symptoms.  - Follows at [facility].  - No change to current plan.   5. CAD: - No s/s of ischemia.    -Severe LM stenosis found 1/19 =>not a CABG candidate due to severe COPD and not PCI candidate due to inability to use Impella due to severe PAD (have reviewed extensively with interventional cardiology). - No ASA with stable CAD and need for Eliquis.  - Continue statin.  6. Mitral valve replacement: - Bioprosthetic valve currently, originally Bjork-Shiley. Valve looked ok onecho this admission.  7. ETOH abuse: - Says he has quit drinking. - No change to current plan.   8. Smoking/COPD:On home oxygen, severe.  - Still smoking some. Needs complete cessation.  - No change to current plan.   9. CKD: Stage 3 - Creatinine near baseline.  10. Neuro: Baseline bipolar disorder and dementia.  Family says he "sleeps all the time" at home.  Suspect hypercarbia in setting of severe COPD is the cause of this.  Delirium in the hospital.  - No change to current plan.   11. Metabolic Encephalopathy: PaCO2 elevated when intubated but actually lower than prior days, cannot explain AMS by hypercarbia. NH3 mildly elevated.  - EEG 10/26/17 1. Moderate diffuse background slowing with occasional triphasic waves, 2. Occasional epileptiform discharges over the left midtemporal and left occipital regions - Start Lactulose 30 mg g TID.  - Abdominal US, look for cirrhosis.  12. Pulmonary: CXR with lower lobe airspace disease.  With low CVP, concern for aspiration. Patient remains intubated.   - Continue Unasyn.   Patient is critically ill and in danger of multiple system organ failure.  Prognosis guarded. Would recommend palliative see for [place], but family has been hesitant to engage in these conversation.   Length of Stay: [address]  Annamaria Helling  10/27/2017, 8:15 AM  Advanced Heart Failure Team Pager [phone] (M-F; 7a - 4p)  Please contact [facility] for  night-coverage after hours (4p -7a ) and weekends on [email or web address]  Patient seen with PA, agree with the above note.  This morning, he is intubated but will awaken, FiO2 0.4.  CXR yesterday with bilateral lower lobe infiltrates.  NH3 elevated mildly at 58.  He was hypercarbic when intubated yesterday but not as much as prior days.  Today, CVP 3.   On exam, intubated/awakens to stimulation.  No JVD.  No edema.  Decreased BS at bases.   He was intubated for airways protection with altered mental status and suspect aspiration as well based on CXR.  I do not think hypercarbia totally explains altered mental status.  ?Hepatic encephalopathy playing a role => NH3 elevated but not markedly so. EEG consistent with metabolic encephalopathy.  - Start lactulose to see if this helps.  - Would like to see [place] of liver for ?cirrhosis.  Long prior history of ETOH.   In terms of CHF, CVP 3 with some rise in BUN/creatinine.  Hold torsemide and losartan today.  Can continue spironolactone, digoxin, Coreg.   Getting coverage with Ampicillin in setting of ?aspiration.   CRITICAL CARE Performed by: Loralie Champagne  Total critical care time: 35 minutes  Critical care time was exclusive of separately billable procedures and treating other patients.  Critical care was necessary to treat or prevent imminent or life-threatening deterioration.  Critical care was time spent personally by me on the following activities: development of treatment plan with patient and/or surrogate as well as nursing, discussions with consultants, evaluation of  patient's response to treatment, examination of patient, obtaining history from patient or surrogate, ordering and performing treatments and interventions, ordering and review of laboratory studies, ordering and review of radiographic studies, pulse oximetry and re-evaluation of patient's condition.  Loralie Champagne 10/27/2017 8:52 AM

## 2017-10-27 NOTE — Progress Notes (Signed)
Initial Nutrition Assessment  DOCUMENTATION CODES:   Not applicable  INTERVENTION:   Tube Feeding:  Vital AF 1.2 @ 45 ml/hr Pro-Stat 30 mL TID Provides 126 g of protein, 1596 kcals and 875 mL of free water Meets 100% estimated calorie and protein needs   NUTRITION DIAGNOSIS:   Inadequate oral intake related to acute illness as evidenced by NPO status.  GOAL:   Patient will meet greater than or equal to 90% of their needs  MONITOR:   Vent status, TF tolerance, Labs, Weight trends  REASON FOR ASSESSMENT:   Consult, Ventilator Enteral/tube feeding initiation and management  ASSESSMENT:   72 yo male admitted with acute respiratory failure, acute encephalopathy, acute advanced heart failure. Pt with hx of 2L O2 dependent COPD, CHF, possible dementia, EtOH abuse, dysphagia, CAD, PAD, MV replacement, CKD III  5/16 Admitted 5/21 Decline in respiratory status requiring Intubation  Patient is currently intubated on ventilator support MV: 8.1 L/min Temp (24hrs), Avg:98.8 F (37.1 C), Min:98.4 F (36.9 C), Max:99.3 F (37.4 C)  MD initiated Adult Tube Feeding Protocol Recorded po intake 100% of meals on Heart Healthy/Carb Modified prior to intubation  No family available to provide diet/weight history Per weight encounters, weight appears to be trending down recently. Unsure how much of this is related to fluid status   Labs: Creatinine 1.71, BUN 50, ammonia 58 Meds: lactulose, MVI, thiamine  NUTRITION - FOCUSED PHYSICAL EXAM:  Unable to assess  Diet Order:   Diet Order           Diet NPO time specified  Diet effective now          EDUCATION NEEDS:   Not appropriate for education at this time  Skin:  Skin Assessment: Reviewed RN Assessment  Last BM:  5/19  Height:   Ht Readings from Last 1 Encounters:  10/21/17 5\' 4"  (1.626 m)    Weight:   Wt Readings from Last 1 Encounters:  10/27/17 153 lb 7 oz (69.6 kg)    Ideal Body Weight:     BMI:   Body mass index is 26.34 kg/m.  Estimated Nutritional Needs:   Kcal:  1600 kcals  Protein:  105-140 g  Fluid:  >/= 1.6 L   Romelle Starcher MS, RD, LDN, CNSC [phone] Pager  [phone] Weekend/On-Call Pager

## 2017-10-28 ENCOUNTER — Inpatient Hospital Stay (HOSPITAL_COMMUNITY): Payer: Medicare Other

## 2017-10-28 LAB — CBC
HCT: 46.4 % (ref 39.0–52.0)
HEMOGLOBIN: 14.2 g/dL (ref 13.0–17.0)
MCH: 27.1 pg (ref 26.0–34.0)
MCHC: 30.6 g/dL (ref 30.0–36.0)
MCV: 88.5 fL (ref 78.0–100.0)
Platelets: 193 10*3/uL (ref 150–400)
RBC: 5.24 MIL/uL (ref 4.22–5.81)
RDW: 16.8 % — ABNORMAL HIGH (ref 11.5–15.5)
WBC: 11.5 10*3/uL — ABNORMAL HIGH (ref 4.0–10.5)

## 2017-10-28 LAB — BASIC METABOLIC PANEL
Anion gap: 13 (ref 5–15)
BUN: 62 mg/dL — AB (ref 6–20)
CHLORIDE: 89 mmol/L — AB (ref 101–111)
CO2: 36 mmol/L — ABNORMAL HIGH (ref 22–32)
Calcium: 9.5 mg/dL (ref 8.9–10.3)
Creatinine, Ser: 1.69 mg/dL — ABNORMAL HIGH (ref 0.61–1.24)
GFR calc non Af Amer: 39 mL/min — ABNORMAL LOW (ref >60)
GFR, EST AFRICAN AMERICAN: 46 mL/min — AB (ref >60)
Glucose, Bld: 222 mg/dL — ABNORMAL HIGH (ref 65–99)
POTASSIUM: 3.6 mmol/L (ref 3.5–5.1)
SODIUM: 138 mmol/L (ref 135–145)

## 2017-10-28 LAB — HEPARIN LEVEL (UNFRACTIONATED): Heparin Unfractionated: >2.2 IU/mL — ABNORMAL HIGH (ref 0.30–0.70)

## 2017-10-28 LAB — GLUCOSE, CAPILLARY
GLUCOSE-CAPILLARY: 275 mg/dL — AB (ref 65–99)
GLUCOSE-CAPILLARY: 161 mg/dL — AB (ref 65–99)
Glucose-Capillary: 206 mg/dL — ABNORMAL HIGH (ref 65–99)
Glucose-Capillary: 166 mg/dL — ABNORMAL HIGH (ref 65–99)

## 2017-10-28 LAB — APTT
aPTT: 80 seconds — ABNORMAL HIGH (ref 24–36)
aPTT: 85 seconds — ABNORMAL HIGH (ref 24–36)

## 2017-10-28 LAB — DIGOXIN LEVEL: DIGOXIN LVL: 0.7 ng/mL — AB (ref 0.8–2.0)

## 2017-10-28 LAB — AMMONIA: Ammonia: 62 umol/L — ABNORMAL HIGH (ref 9–35)

## 2017-10-28 MED ORDER — LOSARTAN POTASSIUM 25 MG PO TABS: 12.5000 mg | ORAL_TABLET | Freq: Every day | ORAL | Status: DC

## 2017-10-28 MED ORDER — LOSARTAN POTASSIUM 25 MG PO TABS
12.5000 mg | ORAL_TABLET | Freq: Every day | ORAL | Status: DC
  Administered 2017-10-28: 12.5 mg
  Filled 2017-10-28: qty 1

## 2017-10-28 MED ORDER — ORAL CARE MOUTH RINSE
15.0000 mL | Freq: Two times a day (BID) | OROMUCOSAL | Status: DC
  Administered 2017-10-28 – 2017-10-30 (×5): 15 mL via OROMUCOSAL

## 2017-10-28 MED ORDER — SODIUM CHLORIDE 0.9 % IV BOLUS
250.0000 mL | Freq: Once | INTRAVENOUS | Status: AC
  Administered 2017-10-28: 250 mL via INTRAVENOUS
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
## 2017-10-28 NOTE — Progress Notes (Signed)
ANTICOAGULATION CONSULT NOTE  Pharmacy Consult for heparin Indication: atrial fibrillation  Allergies  Allergen Reactions  . Warfarin Other (See Comments)    Non compliance and ETOH abuse    Patient Measurements: Height: 5\' 4"  (162.6 cm) Weight: 153 lb 7 oz (69.6 kg) IBW/kg (Calculated) : 59.2 Heparin Dosing Weight: 70 kg  Vital Signs: Temp: 97.9 F (36.6 C) (05/22 2000) Temp Source: Oral (05/22 2000) BP: 109/77 (05/23 0002) Pulse Rate: 60 (05/23 0002)  Labs: Recent Labs    10/25/17 0604 10/26/17 0501  10/26/17 2100 10/27/17 0529 10/27/17 0532 10/27/17 1417 10/27/17 2330  HGB 12.7* 13.8  --   --   --  14.1  --   --   HCT 44.1 46.5  --   --   --  46.0  --   --   PLT 198 200  --   --   --  202  --   --   APTT  --   --    < > 100* 131*  --  137* 85*  HEPARINUNFRC  --   --   --  >2.20* >2.20*  --   --   --   CREATININE 1.43* 1.70*  --   --   --  1.71*  --   --    < > = values in this interval not displayed.    Estimated Creatinine Clearance: 33.7 mL/min (A) (by C-G formula based on SCr of 1.71 mg/dL (H)).   Assessment: 72 y.o. male with h/o Afib, Eliquis on hold, for heparin  Goal of Therapy:  APTT 66-102 Heparin Level 0.3-0.7 Monitor platelets by anticoagulation protocol: Yes   Plan:  Continue Heparin at current rate  Follow-up am labs.   Geannie Risen, PharmD, BCPS  10/28/2017  12:48 AM

## 2017-10-28 NOTE — Procedures (Signed)
Extubation Procedure Note  Patient Details:   Name: [name] DOB: [birth year] MRN: [id number]   Airway Documentation:    Vent end date: 10/28/17 Vent end time: 1316   Evaluation  O2 sats: stable throughout Complications: No apparent complications Patient did tolerate procedure well. Bilateral Breath Sounds: Diminished, Clear   Yes   Pt extubated per MD order.  Pt placed on 3L nasal cannula and tolerating well at this time.  RT will continue to monitor.   Tylene Fantasia 10/28/2017, 1:16 PM

## 2017-10-28 NOTE — Care Management Note (Signed)
Case Management Note Previous CM note completed by Reola Mosher [phone] 10/22/2017, 2:46 PM  Patient Details  Name: [name] MRN: [id number] Date of Birth: 1945/12/03  Subjective/Objective:   CHF                Action/Plan: Patient lives at home; PCP: Georgann Housekeeper, MD; has private insurance with [facility] with prescription drug coverage; awaiting on Physical Therapy eval for disposition needs. CM will continue to follow for progression of care.    Expected Discharge Date:                  Expected Discharge Plan:  Home w Home Health Services  In-House Referral:     Discharge planning Services  CM Consult  Post Acute Care Choice:    Choice offered to:     DME Arranged:    DME Agency:     HH Arranged:    HH Agency:     Status of Service:  In process, will continue to follow  If discussed at Long Length of Stay Meetings, dates discussed:    Discharge Disposition:   Additional Comments:  10/28/17- 1045- Orange Hilligoss RN, CM- pt decompensated 10/26/17 with AMS and intubated for airway protection tx to 2H.- plans to attempt to wean vent today ?aspiratoin.  Possible hepatic encephalopathy. - CM will continue to follow for transition of care needs- last PT eval with recommendations for [facility]- if remains appropriate for return home with [facility] will need orders prior to discharge  Darrold Span, RN 10/28/2017, 10:45 AM Unit 2H 1-22 Case Manager  [phone]

## 2017-10-28 NOTE — Progress Notes (Signed)
Patient with continued soft BP, SBP 80-90s. Decreased UO and amber colored. Concerned patient may be intravascularly dry. Pt no on IVF, NPO awaiting speech eval. Cardiology NP J. Hammonds called and patient discussed. Will give bolus of fluid over an hour.

## 2017-10-28 NOTE — Progress Notes (Signed)
Patient with history of dysphagia, will keep NPO, Speech Evaluation ordered.

## 2017-10-28 NOTE — Progress Notes (Signed)
ANTICOAGULATION CONSULT NOTE - Follow-Up Consult  Pharmacy Consult for apixaban to heparin Indication: atrial fibrillation  Allergies  Allergen Reactions  . Warfarin Other (See Comments)    Non compliance and ETOH abuse    Patient Measurements: Height: 5\' 4"  (162.6 cm) Weight: 147 lb 4.3 oz (66.8 kg) IBW/kg (Calculated) : 59.2 Heparin Dosing Weight: 70 kg  Vital Signs: Temp: 98.7 F (37.1 C) (05/23 0714) Temp Source: Oral (05/23 0714) BP: 109/56 (05/23 0600) Pulse Rate: 61 (05/23 0600)  Labs: Recent Labs    10/26/17 0501  10/26/17 2100 10/27/17 0529 10/27/17 0532 10/27/17 1417 10/27/17 2330 10/28/17 0339 10/28/17 0350  HGB 13.8  --   --   --  14.1  --   --  14.2  --   HCT 46.5  --   --   --  46.0  --   --  46.4  --   PLT 200  --   --   --  202  --   --  193  --   APTT  --    < > 100* 131*  --  137* 85* 80*  --   HEPARINUNFRC  --   --  >2.20* >2.20*  --   --   --   --  >2.20*  CREATININE 1.70*  --   --   --  1.71*  --   --  1.69*  --    < > = values in this interval not displayed.    Estimated Creatinine Clearance: 34.1 mL/min (A) (by C-G formula based on SCr of 1.69 mg/dL (H)).   Medical History: Past Medical History:  Diagnosis Date  . AICD (automatic cardioverter/defibrillator) present   . Alcohol abuse   . Anxiety   . Bipolar disorder (HCC)   . Biventricular automatic implantable cardioverter defibrillator in situ    a. 01/2014 s/p MDT [id number] Viva XT CRT-D (ser # [id number] H).  . CAD (coronary artery disease)    a. s/p prior PCI/stenting of the LCX;  b. 08/2014 MV: EF 20%, large septal, apical, and inferior infarct from apex to base, no ischemia;  c. 08/2014 NSTEMI/Cath: LM mod distal dzs extending into ostial LCX (80%), LAD tortuous, RI nl, OM mod dzs, RCA 100 CTO with R->R and L->R collats-->Med Rx.  . Chronic atrial fibrillation (HCC)    a. CHA2DS2VASc = 6->eliquis;  b. S/P AVN RFCA an BiV ICD placement.  . Chronic respiratory failure (HCC)   . Chronic  systolic congestive heart failure (HCC)    a. 01/2014 Echo: EF 25-30%, mod conc LVH, mod dil LA.  . CKD (chronic kidney disease)    a. suspect stage II-III based on historical labs.  . Complete heart block (HCC)    a. In setting of prior AV nodal ablation r/t afib-->BiV ICD (01/2014).  . COPD (chronic obstructive pulmonary disease) (HCC)   . CVA (cerebral vascular accident) (HCC)    a. Multiple prior embolic strokes.  . Dementia   . Depression   . DVT (deep venous thrombosis) (HCC)   . Dyslipidemia   . Hyperglycemia   . Hypertension   . Mitral valve disease    a. remote mitral replacement with Bjork Shiley valve;  b. 06/2006 Redo MVR with tissue valve.  . Mixed Ischemic and Nonischemic Cardiomyopathy    a. 8/.2015 Echo: Ef 25-30%;  b. 01/2014 s/p MDT [id number] Talbot Grumbling XT CRT-D (ser # [id number] H).  . On home oxygen therapy    "2L; 24/7" (65/16/2019)  . Psoriasis    "  back of his head; elbows; finger of left hand" (06/07/2017)  . Ventral hernia     Assessment: 38 yoM admitted with SOB now with worsening respiratory status requiring intubation. Pt on apixaban PTA and inpatient for hx AFib now to transition to heparin. Heparin level remains falsely elevated due to recent apixaban use, aPTT at goal at 80s. CBC stable, no S/Sx bleeding noted.   Goal of Therapy:  Heparin Level 0.3-0.7 APTT 66-102s Monitor platelets by anticoagulation protocol: Yes   Plan:  -Heparin 650 units/hr -Monitor daily aPTT/heparin level until correlating, CBC, S/Sx bleeding -If extubated, can likely transition back to apixaban tomorrow  Fredonia Highland, PharmD, BCPS PGY-2 Cardiology Pharmacy Resident Pager: [phone] 10/28/2017

## 2017-10-28 NOTE — Progress Notes (Addendum)
Patient ID: [name], male   DOB: Mar 09, 1946, 72 y.o.   MRN: [id number]     Advanced Heart Failure Rounding Note  PCP-Cardiologist: No primary care provider on file.   Subjective:    ABG 5/19 => 7.29/100/72.  Bipap started, repeat ABG 7.43/73/83.    Decompensated 10/26/17 with AMS although PCO2 was actually a bit lower than the prior day. Intubated for airway protection and moved to ICU. NH3 58.  EEG with diffuse cerebral dysfunction.   Intubated but awake. Follows commands  FiO2 40%, PEEP 5. CVP 5-6   EEG 10/26/17 1. Moderate diffuse background slowing with occasional triphasic waves 2. Occasional epileptiform discharges over the left midtemporal and left occipital regions ? Metabolic encephalopathy.  Objective:   Weight Range: 147 lb 4.3 oz (66.8 kg) Body mass index is 25.28 kg/m.   Vital Signs:   Temp:  [97.4 F (36.3 C)-99.2 F (37.3 C)] 98.7 F (37.1 C) (05/23 0714) Pulse Rate:  [25-67] 61 (05/23 0600) Resp:  [12-27] 21 (05/23 0600) BP: (81-155)/(56-109) 109/56 (05/23 0600) SpO2:  [85 %-97 %] 93 % (05/23 0600) FiO2 (%):  [40 %] 40 % (05/23 0400) Weight:  [147 lb 4.3 oz (66.8 kg)] 147 lb 4.3 oz (66.8 kg) (05/23 0352) Last BM Date: 10/24/17  Weight change: Filed Weights   10/26/17 0549 10/27/17 0445 10/28/17 0352  Weight: 154 lb 12.2 oz (70.2 kg) 153 lb 7 oz (69.6 kg) 147 lb 4.3 oz (66.8 kg)   Intake/Output:   Intake/Output Summary (Last 24 hours) at 10/28/2017 0741 Last data filed at 10/28/2017 0700 Gross per 24 hour  Intake 948.06 ml  Output 750 ml  Net 198.06 ml    Physical Exam    General: Intubated. Awakens.  HEENT: + ETT Neck: Supple. JVP ~5-6 cm. Carotids 2+ bilat; no bruits. No thyromegaly or nodule noted. Cor: PMI nondisplaced. RRR, No M/G/R noted Lungs: Diminished basilar sounds Abdomen: Soft, non-tender, non-distended, no HSM. No bruits or masses. +BS  Extremities: No cyanosis, clubbing, or rash. Trace ankle edema.  Neuro: Intubated.  Awakens to verbal stimuli   Telemetry   Afib with BiV pacing, personally reviewed.   Labs    CBC Recent Labs    10/27/17 0532 10/28/17 0339  WBC 12.6* 11.5*  HGB 14.1 14.2  HCT 46.0 46.4  MCV 88.1 88.5  PLT 202 400   Basic Metabolic Panel Recent Labs    10/27/17 0532 10/28/17 0339  NA 140 138  K 3.8 3.6  CL 88* 89*  CO2 36* 36*  GLUCOSE 130* 222*  BUN 50* 62*  CREATININE 1.71* 1.69*  CALCIUM 9.7 9.5   Liver Function Tests Recent Labs    10/26/17 0501 10/27/17 0532  AST 30 29  ALT 18 17  ALKPHOS 122 115  BILITOT 1.2 1.4*  PROT 8.2* 8.0  ALBUMIN 3.4* 3.4*   No results for input(s): LIPASE, AMYLASE in the last 72 hours. Cardiac Enzymes No results for input(s): CKTOTAL, CKMB, CKMBINDEX, TROPONINI in the last 72 hours.  BNP: BNP (last 3 results) Recent Labs    02/01/17 1054 06/06/17 1037 10/21/17 1842  BNP 102.6* 486.4* 456.6*    ProBNP (last 3 results) Recent Labs    05/25/17 1152  PROBNP 1,108*     D-Dimer No results for input(s): DDIMER in the last 72 hours. Hemoglobin A1C No results for input(s): HGBA1C in the last 72 hours. Fasting Lipid Panel No results for input(s): CHOL, HDL, LDLCALC, TRIG, CHOLHDL, LDLDIRECT in the last 72  hours. Thyroid Function Tests Recent Labs    10/26/17 1537  TSH 2.702    Other results:   Imaging    US Abdomen Limited Ruq  Result Date: 10/28/2017 CLINICAL DATA:  Cirrhosis EXAM: ULTRASOUND ABDOMEN LIMITED RIGHT UPPER QUADRANT COMPARISON:  08/23/2017 CT FINDINGS: Gallbladder: Slightly dilated gallbladder with small amount of sludge and stones. Negative sonographic Murphy. Upper normal wall thickness at 3.2 mm Common bile duct: Diameter: 4.2 mm Liver: Increased echogenicity. No focal hepatic abnormality. Limited visualization due to positioning. Portal vein is patent on color Doppler imaging with normal direction of blood flow towards the liver. IMPRESSION: 1. Slightly dilated gallbladder which contains  small amount of stones and sludge. Wall thickness upper normal. No definitive sonographic evidence for cholecystitis or biliary dilatation 2. Increased hepatic echogenicity which may be secondary to steatosis or hepatocellular disease. No focal hepatic abnormality. Electronically Signed   By: Donavan Foil M.D.   On: 10/28/2017 02:56     Medications:     Scheduled Medications: . arformoterol  15 mcg Nebulization BID  . atorvastatin  40 mg Per Tube QHS  . budesonide (PULMICORT) nebulizer solution  0.5 mg Nebulization BID  . carvedilol  3.125 mg Per Tube BID WC  . chlorhexidine gluconate (MEDLINE KIT)  15 mL Mouth Rinse BID  . Chlorhexidine Gluconate Cloth  6 each Topical Q0600  . digoxin  0.125 mg Per Tube Daily  . famotidine  20 mg Per Tube QHS  . feeding supplement (PRO-STAT SUGAR FREE 64)  30 mL Per Tube TID  . insulin aspart  0-9 Units Subcutaneous Q4H  . lactulose  30 g Per Tube TID  . mouth rinse  15 mL Mouth Rinse 10 times per day  . multivitamin with minerals  1 tablet Per Tube Daily  . senna-docusate  1 tablet Per Tube Daily  . sodium chloride flush  10-40 mL Intracatheter Q12H  . spironolactone  25 mg Per Tube Daily  . thiamine  100 mg Per Tube Daily    Infusions: . feeding supplement (VITAL AF 1.2 CAL) 45 mL/hr at 10/27/17 1900  . heparin 650 Units/hr (10/27/17 1900)    PRN Medications: fentaNYL (SUBLIMAZE) injection, fentaNYL (SUBLIMAZE) injection, ipratropium-albuterol, sennosides, sodium chloride flush    Assessment/Plan   1. Acute on chronic systolic CHF: ECHO 51/02/58 EF 20-25% Mixed ischemic/nonischemic (ETOH) cardiomyopathy. S/p [employer] CRT-D. Echo this admission with EF 30-35%.  - Volume status stable to low. Torsemide or losartan held yesterday with mild AKI.  - Continue digoxin 0.125 daily.   - Continue Coreg 3.125 mg BID - Continue spironolactone 25 mg daily.  2. Acute on chronic hypercarbic resp failure - Intubated. Plan to attempt wean today.    - FiO2 40% and PEEP 5. Appreciate CCM care.  - CT chest with RLL/RML infiltrate but PCT not elevated and no fever/WBC elevation.  Possible aspiration.  Not on antibiotics.  - Has severe COPD on home oxygen at baseline.  Suspect he needs CPAP versus Bipap at home.  Lethargy at home is likely due to hypercarbia.   3. Atrial fibrillation:Chronic,s/p AV nodal ablation. - Eliquis held and on heparin gtt.  - No change to current plan.   4. PAD:No claudication symptoms.  - Follows at [facility].  - No change to current plan.   5. CAD: - No s/s of ischemia.     -Severe LM stenosis found 1/19 =>not a CABG candidate due to severe COPD and not PCI candidate due to inability to use Impella  due to severe PAD (have reviewed extensively with interventional cardiology). - No ASA with stable CAD and need for Eliquis.  - Continue statin.  6. Mitral valve replacement: - Bioprosthetic valve currently, originally Bjork-Shiley. Valve looked ok onecho this admission.  7. ETOH abuse: - Says he has quit drinking. - No change to current plan.   8. Smoking/COPD:On home oxygen, severe.  - Still smoking some. Needs complete cessation.  - No change to current plan.   9. AKI on CKD: Stage 3 - Continue to follow.  10. Neuro: Baseline bipolar disorder and dementia.  Family says he "sleeps all the time" at home.  Suspect hypercarbia in setting of severe COPD is the cause of this.  Delirium in the hospital.  - No change to current plan.   11. Metabolic Encephalopathy: PaCO2 elevated when intubated but actually lower than prior days, cannot explain AMS by hypercarbia. NH3 mildly elevated.  - No change to current plan.   - EEG 10/26/17 1. Moderate diffuse background slowing with occasional triphasic waves, 2. Occasional epileptiform discharges over the left midtemporal and left occipital regions - Continue Lactulose 30 mg g TID.  - Abdominal US, look for cirrhosis => showed increased hepatic echogenicity.  12.  Pulmonary: CXR with lower lobe airspace disease.  With low CVP, concern for aspiration. Patient remains intubated.  - Continue Unasyn.   Improved today. Trying to wean vent. May be able to add back low dose losartan.   Length of Stay: [place], [place]  10/28/2017, 7:41 AM  Advanced Heart Failure Team Pager [phone] (M-F; 7a - 4p)  Please contact [facility] for night-coverage after hours (4p -7a ) and weekends on [email or web address]  Patient seen with PA, agree with the above note.  He is more awake this morning, now getting lactulose for possible hepatic encephalopathy.  CVP 5-6 so Lasix is on hold.  He follows commands.   On exam, no JVD.  Awake/alert.  Lungs fairly clear.  No edema.   Hold Lasix, can restart losartan 12.5 mg daily with stable creatinine.   More awake, suspect he can be extubated.   Will continue on lactulose and when extubated will need qhs Bipap.   Loralie Champagne 10/28/2017 8:06 AM

## 2017-10-29 ENCOUNTER — Ambulatory Visit (HOSPITAL_COMMUNITY): Payer: Medicare Other

## 2017-10-29 DIAGNOSIS — J9601 Acute respiratory failure with hypoxia: Secondary | ICD-10-CM

## 2017-10-29 DIAGNOSIS — I5043 Acute on chronic combined systolic (congestive) and diastolic (congestive) heart failure: Secondary | ICD-10-CM

## 2017-10-29 LAB — GLUCOSE, CAPILLARY
GLUCOSE-CAPILLARY: 115 mg/dL — AB (ref 65–99)
GLUCOSE-CAPILLARY: 126 mg/dL — AB (ref 65–99)
GLUCOSE-CAPILLARY: 232 mg/dL — AB (ref 65–99)
GLUCOSE-CAPILLARY: 235 mg/dL — AB (ref 65–99)
Glucose-Capillary: 114 mg/dL — ABNORMAL HIGH (ref 65–99)
Glucose-Capillary: 129 mg/dL — ABNORMAL HIGH (ref 65–99)
Glucose-Capillary: 192 mg/dL — ABNORMAL HIGH (ref 65–99)
Glucose-Capillary: 228 mg/dL — ABNORMAL HIGH (ref 65–99)

## 2017-10-29 LAB — COMPREHENSIVE METABOLIC PANEL
ALT: 16 U/L — ABNORMAL LOW (ref 17–63)
ANION GAP: 12 (ref 5–15)
AST: 26 U/L (ref 15–41)
Albumin: 3.1 g/dL — ABNORMAL LOW (ref 3.5–5.0)
Alkaline Phosphatase: 96 U/L (ref 38–126)
BUN: 74 mg/dL — ABNORMAL HIGH (ref 6–20)
CHLORIDE: 93 mmol/L — AB (ref 101–111)
CO2: 38 mmol/L — AB (ref 22–32)
Calcium: 9.3 mg/dL (ref 8.9–10.3)
Creatinine, Ser: 1.86 mg/dL — ABNORMAL HIGH (ref 0.61–1.24)
GFR calc non Af Amer: 35 mL/min — ABNORMAL LOW (ref >60)
GFR, EST AFRICAN AMERICAN: 41 mL/min — AB (ref >60)
Glucose, Bld: 130 mg/dL — ABNORMAL HIGH (ref 65–99)
Potassium: 3.7 mmol/L (ref 3.5–5.1)
SODIUM: 143 mmol/L (ref 135–145)
Total Bilirubin: 1.1 mg/dL (ref 0.3–1.2)
Total Protein: 7.5 g/dL (ref 6.5–8.1)

## 2017-10-29 LAB — APTT: aPTT: 91 seconds — ABNORMAL HIGH (ref 24–36)

## 2017-10-29 LAB — CBC
HCT: 44.1 % (ref 39.0–52.0)
HEMOGLOBIN: 13.2 g/dL (ref 13.0–17.0)
MCH: 26.7 pg (ref 26.0–34.0)
MCHC: 29.9 g/dL — ABNORMAL LOW (ref 30.0–36.0)
MCV: 89.3 fL (ref 78.0–100.0)
Platelets: 183 10*3/uL (ref 150–400)
RBC: 4.94 MIL/uL (ref 4.22–5.81)
RDW: 16.7 % — ABNORMAL HIGH (ref 11.5–15.5)
WBC: 11.7 10*3/uL — ABNORMAL HIGH (ref 4.0–10.5)

## 2017-10-29 LAB — HEPARIN LEVEL (UNFRACTIONATED)

## 2017-10-29 MED ORDER — APIXABAN 5 MG PO TABS
5.0000 mg | ORAL_TABLET | Freq: Two times a day (BID) | ORAL | Status: DC
  Administered 2017-10-29 – 2017-11-03 (×11): 5 mg via ORAL
  Filled 2017-10-29 (×11): qty 1

## 2017-10-29 MED ORDER — DIGOXIN 125 MCG PO TABS
0.1250 mg | ORAL_TABLET | Freq: Every day | ORAL | Status: DC
  Administered 2017-10-29 – 2017-11-03 (×6): 0.125 mg via ORAL
  Filled 2017-10-29 (×6): qty 1

## 2017-10-29 MED ORDER — SENNOSIDES 8.8 MG/5ML PO SYRP
5.0000 mL | ORAL_SOLUTION | Freq: Two times a day (BID) | ORAL | Status: DC | PRN
  Filled 2017-10-29: qty 5

## 2017-10-29 MED ORDER — SODIUM CHLORIDE 0.9 % IV BOLUS
250.0000 mL | Freq: Once | INTRAVENOUS | Status: AC
  Administered 2017-10-29: 250 mL via INTRAVENOUS

## 2017-10-29 MED ORDER — CARVEDILOL 3.125 MG PO TABS
3.1250 mg | ORAL_TABLET | Freq: Two times a day (BID) | ORAL | Status: DC
  Administered 2017-10-30 – 2017-11-02 (×8): 3.125 mg via ORAL
  Filled 2017-10-29 (×9): qty 1

## 2017-10-29 MED ORDER — SENNOSIDES-DOCUSATE SODIUM 8.6-50 MG PO TABS
1.0000 | ORAL_TABLET | Freq: Every day | ORAL | Status: DC
  Administered 2017-10-29 – 2017-11-03 (×5): 1 via ORAL
  Filled 2017-10-29 (×5): qty 1

## 2017-10-29 MED ORDER — ATORVASTATIN CALCIUM 40 MG PO TABS
40.0000 mg | ORAL_TABLET | Freq: Every day | ORAL | Status: DC
  Administered 2017-10-29 – 2017-11-02 (×5): 40 mg via ORAL
  Filled 2017-10-29 (×5): qty 1

## 2017-10-29 MED ORDER — INSULIN ASPART 100 UNIT/ML ~~LOC~~ SOLN
0.0000 [IU] | Freq: Every day | SUBCUTANEOUS | Status: DC
  Administered 2017-10-30 – 2017-10-31 (×2): 2 [IU] via SUBCUTANEOUS
  Administered 2017-11-01: 3 [IU] via SUBCUTANEOUS

## 2017-10-29 MED ORDER — FAMOTIDINE 20 MG PO TABS: 20.0000 mg | ORAL_TABLET | Freq: Every day | ORAL | Status: DC

## 2017-10-29 MED ORDER — VITAMIN B-1 100 MG PO TABS
100.0000 mg | ORAL_TABLET | Freq: Every day | ORAL | Status: DC
  Administered 2017-10-29 – 2017-11-03 (×6): 100 mg via ORAL
  Filled 2017-10-29 (×6): qty 1

## 2017-10-29 MED ORDER — ADULT MULTIVITAMIN W/MINERALS CH
1.0000 | ORAL_TABLET | Freq: Every day | ORAL | Status: DC
  Administered 2017-10-29 – 2017-11-03 (×6): 1 via ORAL
  Filled 2017-10-29 (×6): qty 1

## 2017-10-29 MED ORDER — SPIRONOLACTONE 25 MG PO TABS
25.0000 mg | ORAL_TABLET | Freq: Every day | ORAL | Status: DC
  Administered 2017-10-29: 25 mg via ORAL
  Filled 2017-10-29: qty 1

## 2017-10-29 MED ORDER — LOSARTAN POTASSIUM 25 MG PO TABS
12.5000 mg | ORAL_TABLET | Freq: Every day | ORAL | Status: DC
  Administered 2017-10-29: 12.5 mg via ORAL
  Filled 2017-10-29: qty 1

## 2017-10-29 MED ORDER — LACTULOSE 10 GM/15ML PO SOLN
30.0000 g | Freq: Three times a day (TID) | ORAL | Status: DC
  Administered 2017-10-29: 30 g via ORAL
  Filled 2017-10-29: qty 45

## 2017-10-29 MED ORDER — INSULIN ASPART 100 UNIT/ML ~~LOC~~ SOLN
0.0000 [IU] | Freq: Three times a day (TID) | SUBCUTANEOUS | Status: DC
  Administered 2017-10-29 (×2): 5 [IU] via SUBCUTANEOUS
  Administered 2017-10-30: 8 [IU] via SUBCUTANEOUS
  Administered 2017-10-30: 5 [IU] via SUBCUTANEOUS
  Administered 2017-10-30: 3 [IU] via SUBCUTANEOUS
  Administered 2017-10-31: 5 [IU] via SUBCUTANEOUS
  Administered 2017-10-31: 11 [IU] via SUBCUTANEOUS
  Administered 2017-10-31: 3 [IU] via SUBCUTANEOUS
  Administered 2017-11-01: 8 [IU] via SUBCUTANEOUS
  Administered 2017-11-01: 2 [IU] via SUBCUTANEOUS
  Administered 2017-11-01 – 2017-11-02 (×2): 5 [IU] via SUBCUTANEOUS
  Administered 2017-11-02: 3 [IU] via SUBCUTANEOUS
  Administered 2017-11-02: 2 [IU] via SUBCUTANEOUS
  Administered 2017-11-03: 5 [IU] via SUBCUTANEOUS
  Administered 2017-11-03: 2 [IU] via SUBCUTANEOUS

## 2017-10-29 NOTE — Progress Notes (Signed)
Physical Therapy Treatment Patient Details Name: [name] MRN: [id number] DOB: [birth year] Today's Date: 10/29/2017    History of Present Illness 72 y.o. male admitted 5/16 with decreased appetite, increased fatigue and FTT, respiratory failure with acute CHF on COPD with decline requiring intubation 5/21-5/23. PMH includes: chronic combined HF, COPD, a fib, pacemaker placement, mitral valve replacement CAD, CVA, chronic respiratory failure, Dementia, on home oxygen,     PT Comments    Pt presents HOH and disoriented to time but willing to participate with therapy. Daughter and grandaughter present throughout session and able to answer questions. Pt demonstrates improved mobility as evident by ability to ambulate 275 ft min A with w/c follow for safety. Family reports having ramp to enter home and that there will be 24 hour supervision. Vitals remained stable throughout gait.   Follow Up Recommendations  Home health PT;Supervision/Assistance - 24 hour     Equipment Recommendations  Rolling walker with 5" wheels    Recommendations for Other Services       Precautions / Restrictions Precautions Precautions: Fall Restrictions Weight Bearing Restrictions: No    Mobility  Bed Mobility Overal bed mobility: Needs Assistance Bed Mobility: Supine to Sit     Supine to sit: Supervision     General bed mobility comments: Increased time and supervision for safety  Transfers Overall transfer level: Needs assistance Equipment used: Rolling walker (2 wheeled) Transfers: Sit to/from Stand Sit to Stand: Min guard         General transfer comment: Min Guard for safety  Ambulation/Gait Ambulation/Gait assistance: +2 safety/equipment;Min assist Ambulation Distance (Feet): 275 Feet Assistive device: Rolling walker (2 wheeled) Gait Pattern/deviations: Trunk flexed;Decreased step length - right;Decreased step length - left;Step-through pattern Gait velocity: decreased   General  Gait Details: Pt ambulated 275 ft with RW min A on 4L O2. W/C follow for safety Pt required cueing for staying in RW and for direction   Stairs             Wheelchair Mobility    Modified Rankin (Stroke Patients Only)       Balance Overall balance assessment: Needs assistance Sitting-balance support: No upper extremity supported Sitting balance-Leahy Scale: Good     Standing balance support: No upper extremity supported Standing balance-Leahy Scale: Fair Standing balance comment: Pt able to stand without UE support during sit>stand transfer                            Cognition Arousal/Alertness: Awake/alert Behavior During Therapy: Flat affect Overall Cognitive Status: Impaired/Different from baseline Area of Impairment: Orientation                 Orientation Level: Disoriented to;Time Current Attention Level: Focused   Following Commands: Follows one step commands consistently Safety/Judgement: Decreased awareness of safety;Decreased awareness of deficits Awareness: Intellectual   General Comments: Pt HOH. daughter and grandaughter present throughout session and report 24 hr supervision      Exercises      General Comments General comments (skin integrity, edema, etc.): Family educated to allow pt to perform ADLs as independently as possible      Pertinent Vitals/Pain Pain Assessment: No/denies pain    Home Living Family/patient expects to be discharged to:: Private residence Living Arrangements: Children;Other relatives Available Help at Discharge: Family;Available 24 hours/day Type of Home: House Home Access: Ramped entrance;Stairs to enter Entrance Stairs-Rails: Right Home Layout: One level Home Equipment: Walker - 2 wheels;Other (comment);Shower  seat;Electric scooter      Prior Function Level of Independence: Independent with assistive device(s);Needs assistance  Gait / Transfers Assistance Needed: pt would walk max 40' ADL's  / Homemaking Assistance Needed: toileting independently, he simply wasn't bathing at home Comments: progressive sleeping throughout the day    PT Goals (current goals can now be found in the care plan section) Acute Rehab PT Goals Patient Stated Goal: Go home PT Goal Formulation: With family Time For Goal Achievement: 11/12/17 Potential to Achieve Goals: Good Progress towards PT goals: Progressing toward goals    Frequency    Min 3X/week      PT Plan Current plan remains appropriate    Co-evaluation              AM-PAC PT "6 Clicks" Daily Activity  Outcome Measure  Difficulty turning over in bed (including adjusting bedclothes, sheets and blankets)?: A Little Difficulty moving from lying on back to sitting on the side of the bed? : A Little Difficulty sitting down on and standing up from a chair with arms (e.g., wheelchair, bedside commode, etc,.)?: A Little Help needed moving to and from a bed to chair (including a wheelchair)?: A Little Help needed walking in hospital room?: A Little Help needed climbing 3-5 steps with a railing? : A Lot 6 Click Score: 17    End of Session Equipment Utilized During Treatment: Gait belt;Oxygen Activity Tolerance: Patient tolerated treatment well Patient left: in chair;with chair alarm set;with family/visitor present;with call bell/phone within reach Nurse Communication: Mobility status PT Visit Diagnosis: Unsteadiness on feet (R26.81)     Time: 1610-9604 PT Time Calculation (min) (ACUTE ONLY): 29 min  Charges:  $Gait Training: 8-22 mins                    G Codes:       Gabe Domonique Brouillard, SPT   [employer] 10/29/2017, 2:10 PM
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
## 2017-10-29 NOTE — Progress Notes (Signed)
ANTICOAGULATION CONSULT NOTE - Follow-Up Consult  Pharmacy Consult for apixaban to heparin to apixaban Indication: atrial fibrillation  Allergies  Allergen Reactions  . Warfarin Other (See Comments)    Non compliance and ETOH abuse    Patient Measurements: Height: 5\' 4"  (162.6 cm) Weight: 142 lb 13.7 oz (64.8 kg) IBW/kg (Calculated) : 59.2 Heparin Dosing Weight: 70 kg  Vital Signs: Temp: 96.6 F (35.9 C) (05/24 0448) Temp Source: Oral (05/24 0448) BP: 107/81 (05/24 0800) Pulse Rate: 60 (05/24 0800)  Labs: Recent Labs    10/27/17 0529  10/27/17 0532  10/27/17 2330 10/28/17 0339 10/28/17 0350 10/29/17 0426 10/29/17 0427  HGB  --    < > 14.1  --   --  14.2  --  13.2  --   HCT  --   --  46.0  --   --  46.4  --  44.1  --   PLT  --   --  202  --   --  193  --  183  --   APTT 131*  --   --    < > 85* 80*  --  91*  --   HEPARINUNFRC >2.20*  --   --   --   --   --  >2.20*  --  >2.20*  CREATININE  --   --  1.71*  --   --  1.69*  --  1.86*  --    < > = values in this interval not displayed.    Estimated Creatinine Clearance: 30.9 mL/min (A) (by C-G formula based on SCr of 1.86 mg/dL (H)).   Medical History: Past Medical History:  Diagnosis Date  . AICD (automatic cardioverter/defibrillator) present   . Alcohol abuse   . Anxiety   . Bipolar disorder (HCC)   . Biventricular automatic implantable cardioverter defibrillator in situ    a. 01/2014 s/p MDT DTBA1D1 Viva XT CRT-D (ser # [id number]).  . CAD (coronary artery disease)    a. s/p prior PCI/stenting of the LCX;  b. 08/2014 MV: EF 20%, large septal, apical, and inferior infarct from apex to base, no ischemia;  c. 08/2014 NSTEMI/Cath: LM mod distal dzs extending into ostial LCX (80%), LAD tortuous, RI nl, OM mod dzs, RCA 100 CTO with R->R and L->R collats-->Med Rx.  . Chronic atrial fibrillation (HCC)    a. CHA2DS2VASc = 6->eliquis;  b. S/P AVN RFCA an BiV ICD placement.  . Chronic respiratory failure (HCC)   . Chronic  systolic congestive heart failure (HCC)    a. 01/2014 Echo: EF 25-30%, mod conc LVH, mod dil LA.  . CKD (chronic kidney disease)    a. suspect stage II-III based on historical labs.  . Complete heart block (HCC)    a. In setting of prior AV nodal ablation r/t afib-->BiV ICD (01/2014).  . COPD (chronic obstructive pulmonary disease) (HCC)   . CVA (cerebral vascular accident) (HCC)    a. Multiple prior embolic strokes.  . Dementia   . Depression   . DVT (deep venous thrombosis) (HCC)   . Dyslipidemia   . Hyperglycemia   . Hypertension   . Mitral valve disease    a. remote mitral replacement with Bjork Shiley valve;  b. 06/2006 Redo MVR with tissue valve.  . Mixed Ischemic and Nonischemic Cardiomyopathy    a. 8/.2015 Echo: Ef 25-30%;  b. 01/2014 s/p MDT [id number] Talbot Grumbling XT CRT-D (ser # [id number]).  . On home oxygen therapy    "2L; 24/7" (  65/16/2019)  . Psoriasis    "back of his head; elbows; finger of left hand" (06/07/2017)  . Ventral hernia     Assessment: 36 yoM admitted with SOB now with worsening respiratory status requiring intubation. Pt on apixaban PTA transitioned to heparin while intubated, now back to apixaban s/p extubation yesterday. SCr 1.86, but 5mg  BID dose appropriate given age and weight.   Goal of Therapy:  Heparin Level 0.3-0.7 APTT 66-102s Monitor platelets by anticoagulation protocol: Yes   Plan:  Stop heparin Apixaban 5mg  BID Pharmacy will sign off, reconsult as needed  Fredonia Highland, PharmD, BCPS PGY-2 Cardiology Pharmacy Resident Pager: [phone] 10/29/2017

## 2017-10-29 NOTE — Progress Notes (Signed)
Patient placed on BIPAP NIV through the vent and tolerating well. Setting per RT of 15/5 with rate at 12 and 40%. Patient resting comfortably and RT will continue to monitor throughout the night per protocol.

## 2017-10-29 NOTE — Progress Notes (Signed)
Paged Vanice Sarah, NP about pt's urine output which has been 290 ml all day w/zero bladder scan.   Awaiting new orders from provider.

## 2017-10-29 NOTE — Progress Notes (Signed)
Patient ID: [name], male   DOB: 11/12/1945, 72 y.o.   MRN: [id number]     Advanced Heart Failure Rounding Note  PCP-Cardiologist: No primary care provider on file.   Subjective:    ABG 5/19 => 7.29/100/72.  Bipap started, repeat ABG 7.43/73/83.    Decompensated 10/26/17 with AMS although PCO2 was actually a bit lower than the prior day. Intubated for airway protection and moved to ICU. NH3 58.  EEG with diffuse cerebral dysfunction. Lactulose started.   He was extubated 5/23.  Awake, more alert this morning. Answers questions though laconic.  CVP 3-4.  Denies dyspnea or chest pain.   EEG 10/26/17 1. Moderate diffuse background slowing with occasional triphasic waves 2. Occasional epileptiform discharges over the left midtemporal and left occipital regions ? Metabolic encephalopathy.  Objective:   Weight Range: 142 lb 13.7 oz (64.8 kg) Body mass index is 24.52 kg/m.   Vital Signs:   Temp:  [96.6 F (35.9 C)-98.1 F (36.7 C)] 96.6 F (35.9 C) (05/24 0448) Pulse Rate:  [36-108] 60 (05/24 0800) Resp:  [8-32] 17 (05/24 0800) BP: (71-135)/(41-83) 107/81 (05/24 0800) SpO2:  [90 %-98 %] 93 % (05/24 0800) FiO2 (%):  [40 %] 40 % (05/24 0005) Weight:  [142 lb 13.7 oz (64.8 kg)] 142 lb 13.7 oz (64.8 kg) (05/24 0500) Last BM Date: 10/28/17  Weight change: Filed Weights   10/27/17 0445 10/28/17 0352 10/29/17 0500  Weight: 153 lb 7 oz (69.6 kg) 147 lb 4.3 oz (66.8 kg) 142 lb 13.7 oz (64.8 kg)   Intake/Output:   Intake/Output Summary (Last 24 hours) at 10/29/2017 0817 Last data filed at 10/29/2017 0600 Gross per 24 hour  Intake 696.95 ml  Output 625 ml  Net 71.95 ml    Physical Exam    General: NAD Neck: No JVD, no thyromegaly or thyroid nodule.  Lungs: Clear to auscultation bilaterally with normal respiratory effort. CV: Nondisplaced PMI.  Heart regular S1/S2, no S3/S4, no murmur.  No peripheral edema.   Abdomen: Soft, nontender, no hepatosplenomegaly, no distention.    Skin: Intact without lesions or rashes.  Neurologic: Alert and oriented x 3.  Psych: Normal affect. Extremities: No clubbing or cyanosis.  HEENT: Normal.    Telemetry   Afib with BiV pacing, personally reviewed.   Labs    CBC Recent Labs    10/28/17 0339 10/29/17 0426  WBC 11.5* 11.7*  HGB 14.2 13.2  HCT 46.4 44.1  MCV 88.5 89.3  PLT 193 183   Basic Metabolic Panel Recent Labs    78/29/56 0339 10/29/17 0426  NA 138 143  K 3.6 3.7  CL 89* 93*  CO2 36* 38*  GLUCOSE 222* 130*  BUN 62* 74*  CREATININE 1.69* 1.86*  CALCIUM 9.5 9.3   Liver Function Tests Recent Labs    10/27/17 0532 10/29/17 0426  AST 29 26  ALT 17 16*  ALKPHOS 115 96  BILITOT 1.4* 1.1  PROT 8.0 7.5  ALBUMIN 3.4* 3.1*   No results for input(s): LIPASE, AMYLASE in the last 72 hours. Cardiac Enzymes No results for input(s): CKTOTAL, CKMB, CKMBINDEX, TROPONINI in the last 72 hours.  BNP: BNP (last 3 results) Recent Labs    02/01/17 1054 06/06/17 1037 10/21/17 1842  BNP 102.6* 486.4* 456.6*    ProBNP (last 3 results) Recent Labs    05/25/17 1152  PROBNP 1,108*     D-Dimer No results for input(s): DDIMER in the last 72 hours. Hemoglobin A1C No results for  input(s): HGBA1C in the last 72 hours. Fasting Lipid Panel No results for input(s): CHOL, HDL, LDLCALC, TRIG, CHOLHDL, LDLDIRECT in the last 72 hours. Thyroid Function Tests Recent Labs    10/26/17 1537  TSH 2.702    Other results:   Imaging    No results found.   Medications:     Scheduled Medications: . arformoterol  15 mcg Nebulization BID  . atorvastatin  40 mg Per Tube QHS  . budesonide (PULMICORT) nebulizer solution  0.5 mg Nebulization BID  . carvedilol  3.125 mg Per Tube BID WC  . Chlorhexidine Gluconate Cloth  6 each Topical Q0600  . digoxin  0.125 mg Per Tube Daily  . famotidine  20 mg Per Tube QHS  . insulin aspart  0-9 Units Subcutaneous Q4H  . lactulose  30 g Per Tube TID  . losartan  12.5  mg Per Tube Daily  . mouth rinse  15 mL Mouth Rinse BID  . multivitamin with minerals  1 tablet Per Tube Daily  . senna-docusate  1 tablet Per Tube Daily  . sodium chloride flush  10-40 mL Intracatheter Q12H  . spironolactone  25 mg Per Tube Daily  . thiamine  100 mg Per Tube Daily    Infusions: . heparin 650 Units/hr (10/29/17 0757)    PRN Medications: ipratropium-albuterol, sennosides, sodium chloride flush    Assessment/Plan   1. Acute on chronic systolic CHF: ECHO 05/23/15 EF 20-25% Mixed ischemic/nonischemic (ETOH) cardiomyopathy. S/p Medtronic CRT-D. Echo this admission with EF 30-35%.  Initially diuresed, now CVP 3-4 and has been off Lasix.  - Continue to hold diuretic today, encourage po intake.  - Continue digoxin 0.125 daily.   - Continue Coreg 3.125 mg BID - Continue spironolactone 25 mg daily.  - Can continue losartan 12.5 daily but may have to discontinue again if further BUN/creatinine rise.  2. Acute on chronic hypercarbic resp failure: Intubated for airways protection and treated with Unasyn for possible aspiration.  Now extubated, off Unasyn.  - Has severe COPD on home oxygen at baseline.  Continue qhs bipap, suspect he needs this at home.   3. Atrial fibrillation:Chronic,s/p AV nodal ablation. - Can stop heparin gtt today and restart on Eliquis.   4. PAD:No claudication symptoms: Follows at [facility].  - No change to current plan.   5. CAD:  Severe LM stenosis found 1/19 =>not a CABG candidate due to severe COPD and not PCI candidate due to inability to use Impella due to severe PAD (have reviewed extensively with interventional cardiology).  No chest pain.  - No ASA with stable CAD and need for Eliquis.  - Continue statin.  6. Mitral valve replacement:Bioprosthetic valve currently, originally Bjork-Shiley. Valve looked ok onecho this admission.  7. ETOH abuse: Says he has quit drinking. - No change to current plan.   8. Smoking/COPD:On home oxygen,  severe.  - Still smoking some. Needs complete cessation.  - No change to current plan.   9. AKI on CKD Stage 3: BUN/creatinine a bit higher today with low CVP.  Encourage po intake and hold diuretic.  If rise again tomorrow, hold losartan again.  10. Neuro: Baseline bipolar disorder and dementia.  Family says he "sleeps all the time" at home.  Suspect hypercarbia in setting of severe COPD is the cause of this.  Delirium in the hospital.  - No change to current plan.   11. Metabolic Encephalopathy: PaCO2 elevated when intubated but actually lower than prior days, cannot explain all AMS  by hypercarbia. NH3 mildly elevated. EEG 10/26/17 1. Moderate diffuse background slowing with occasional triphasic waves, 2. Occasional epileptiform discharges over the left midtemporal and left occipital regions.  We have been treating for hepatic encephalopathy with lactulose. Abdominal US, look for cirrhosis => showed increased hepatic echogenicity. He is more alert today.  - Continue Lactulose 30 mg g TID. Will continue lower dose at home.   Length of Stay: 8  Marca Ancona, MD  10/29/2017, 8:17 AM  Advanced Heart Failure Team Pager [phone] (M-F; 7a - 4p)  Please contact [facility] for night-coverage after hours (4p -7a ) and weekends on [email or web address]

## 2017-10-29 NOTE — Progress Notes (Signed)
OT Cancellation Note  Patient Details Name: [name] MRN: [id number] DOB: January 08, 1946   Cancelled Treatment:    Reason Eval/Treat Not Completed: Patient at procedure or test/ unavailable - Pt working with PT.  Will reattempt.  [name], OTR/L [phone]   [name] M 10/29/2017, 1:25 PM

## 2017-10-29 NOTE — Progress Notes (Signed)
Order form for NIV on front of chart for MD to sign.

## 2017-10-29 NOTE — Progress Notes (Signed)
  Speech Language Pathology Treatment: Dysphagia  Patient Details Name: [name] MRN: [id number] DOB: December 26, 1945 Today's Date: 10/29/2017 Time: 8616-8372 SLP Time Calculation (min) (ACUTE ONLY): 14 min  Assessment / Plan / Recommendation Clinical Impression  Pt noted to have mild lingual candidias and appears to be receiving chlorhexidine 2 times a day. Similar presentation today comapred to swallow assessment 5/20 the day before medical decline with respiratory failure, elevated ammonia level requiring intubation. Coughed using straw with thin decreased to subtle throat clear once with cup sips. Mildly delayed mastication with cracker but functional. Vocal quality is clear with adequate intensity, pt denies pain with swallow. Will initiate thin, no straws and Dys 3 diet texture. Asked RN to page SLP if any s/s aspiration present or respiratory distress. May need objective assessment and SLP plans to check on pt tomorrow schedule permitting.    HPI HPI: [name] is a 72 y.o. male with medical history significant for mixed ischemic/nonischemic cardiomyopathy, combined systolic and diastolic heart failure, atrial fibrillation status post AV nodal ablation and BiV pacemaker, mitral valve replacement x2, chronic anticoagulation, CAD, chronic totally occluded pRCA,  O2 dependent COPD with ongoing tobacco abuse and history of heavy alcohol no alcohol for over 2 years.  Admitted with SOB, decreased appetite, increased fatigue, failure to thrive with generalized weakness. Chest CT focal infiltrate in the right base and perhaps the right middle lobe is suggestive of pneumonia or aspiration. BSE this admission 10/25/17 recommended regular/thin. Medical decline with ICU transfer and intubated 5/21-5/23. Prior dysphagia history includes BSE 2013 recommended reg/thin, In 2017 pt on nectar thick, D3 due to suspected respiratory based dysphagia and able to d/c on soft solids and thin. Normal MBS 2002.       SLP Plan  Continue with current plan of care       Recommendations  Diet recommendations: Dysphagia 3 (mechanical soft);Thin liquid Liquids provided via: Cup;No straw Medication Administration: Other (Comment)(1-2 pill w/ water if small, if large whole in puree) Supervision: Patient able to self feed;Full supervision/cueing for compensatory strategies Compensations: Slow rate;Small sips/bites Postural Changes and/or Swallow Maneuvers: Seated upright 90 degrees                Oral Care Recommendations: Oral care BID Follow up Recommendations: 24 hour supervision/assistance(TBD) SLP Visit Diagnosis: Dysphagia, unspecified (R13.10) Plan: Continue with current plan of care       GO                [name] 10/29/2017, 11:12 AM  [name].Ed [employer] [phone]

## 2017-10-30 DIAGNOSIS — F310 Bipolar disorder, current episode hypomanic: Secondary | ICD-10-CM

## 2017-10-30 LAB — GLUCOSE, CAPILLARY
GLUCOSE-CAPILLARY: 209 mg/dL — AB (ref 65–99)
Glucose-Capillary: 160 mg/dL — ABNORMAL HIGH (ref 65–99)
Glucose-Capillary: 273 mg/dL — ABNORMAL HIGH (ref 65–99)

## 2017-10-30 LAB — BASIC METABOLIC PANEL
Anion gap: 7 (ref 5–15)
BUN: 78 mg/dL — ABNORMAL HIGH (ref 6–20)
CO2: 38 mmol/L — AB (ref 22–32)
CREATININE: 1.69 mg/dL — AB (ref 0.61–1.24)
Calcium: 8.9 mg/dL (ref 8.9–10.3)
Chloride: 91 mmol/L — ABNORMAL LOW (ref 101–111)
GFR calc non Af Amer: 39 mL/min — ABNORMAL LOW (ref >60)
GFR, EST AFRICAN AMERICAN: 46 mL/min — AB (ref >60)
GLUCOSE: 162 mg/dL — AB (ref 65–99)
Potassium: 3.5 mmol/L (ref 3.5–5.1)
Sodium: 136 mmol/L (ref 135–145)

## 2017-10-30 MED ORDER — POTASSIUM CHLORIDE CRYS ER 20 MEQ PO TBCR
20.0000 meq | EXTENDED_RELEASE_TABLET | Freq: Once | ORAL | Status: AC
  Administered 2017-10-30: 20 meq via ORAL
  Filled 2017-10-30: qty 1

## 2017-10-30 NOTE — Progress Notes (Addendum)
Patient ID: [name], male   DOB: [birth year], 72 y.o.   MRN: [id number]     Advanced Heart Failure Rounding Note  PCP-Cardiologist: No primary care provider on file.   Subjective:    No complaints today. Denies chest pain or dyspnea. Diet restricted due to possible aspiration risk. Appears in a paced rhythm - no fast a-fib overnight. Creatinine improving, however, weight is up - ?accuracy (155 from 142 lbs yesterday - was 147 lbs prior). Will need to monitor as diuretics on hold.   Objective:   Weight Range: 155 lb 6.8 oz (70.5 kg) Body mass index is 26.68 kg/m.   Vital Signs:   Temp:  [97.5 F (36.4 C)-97.9 F (36.6 C)] 97.8 F (36.6 C) (05/25 0834) Pulse Rate:  [49-83] 59 (05/25 0900) Resp:  [14-29] 14 (05/25 0900) BP: (75-131)/(43-99) 109/55 (05/25 0900) SpO2:  [90 %-99 %] 95 % (05/25 0900) Weight:  [155 lb 6.8 oz (70.5 kg)] 155 lb 6.8 oz (70.5 kg) (05/25 0300) Last BM Date: 10/28/17  Weight change: Filed Weights   10/28/17 0352 10/29/17 0500 10/30/17 0300  Weight: 147 lb 4.3 oz (66.8 kg) 142 lb 13.7 oz (64.8 kg) 155 lb 6.8 oz (70.5 kg)   Intake/Output:   Intake/Output Summary (Last 24 hours) at 10/30/2017 0951 Last data filed at 10/30/2017 0809 Gross per 24 hour  Intake 450 ml  Output 940 ml  Net -490 ml    Physical Exam    General appearance: alert and no distress Neck: no carotid bruit, no JVD and thyroid not enlarged, symmetric, no tenderness/mass/nodules Lungs: diminished breath sounds bibasilar Heart: regular rate and rhythm Abdomen: soft, non-tender; bowel sounds normal; no masses,  no organomegaly Extremities: extremities normal, atraumatic, no cyanosis or edema Pulses: 2+ and symmetric Skin: pale, dry, flaky Neurologic: Mental status: Awake and alert, oriented to place and year, not month Psych: Pleasant  Telemetry   Afib with BiV pacing - personally reviewed.   Labs    CBC Recent Labs    10/28/17 0339 10/29/17 0426  WBC 11.5* 11.7*    HGB 14.2 13.2  HCT 46.4 44.1  MCV 88.5 89.3  PLT 193 183   Basic Metabolic Panel Recent Labs    20/23/34 0426 10/30/17 0345  NA 143 136  K 3.7 3.5  CL 93* 91*  CO2 38* 38*  GLUCOSE 130* 162*  BUN 74* 78*  CREATININE 1.86* 1.69*  CALCIUM 9.3 8.9   Liver Function Tests Recent Labs    10/29/17 0426  AST 26  ALT 16*  ALKPHOS 96  BILITOT 1.1  PROT 7.5  ALBUMIN 3.1*   No results for input(s): LIPASE, AMYLASE in the last 72 hours. Cardiac Enzymes No results for input(s): CKTOTAL, CKMB, CKMBINDEX, TROPONINI in the last 72 hours.  BNP: BNP (last 3 results) Recent Labs    02/01/17 1054 06/06/17 1037 10/21/17 1842  BNP 102.6* 486.4* 456.6*    ProBNP (last 3 results) Recent Labs    05/25/17 1152  PROBNP 1,108*     D-Dimer No results for input(s): DDIMER in the last 72 hours. Hemoglobin A1C No results for input(s): HGBA1C in the last 72 hours. Fasting Lipid Panel No results for input(s): CHOL, HDL, LDLCALC, TRIG, CHOLHDL, LDLDIRECT in the last 72 hours. Thyroid Function Tests No results for input(s): TSH, T4TOTAL, T3FREE, THYROIDAB in the last 72 hours.  Invalid input(s): FREET3  Other results:   Imaging    No results found.   Medications:  Scheduled Medications: . apixaban  5 mg Oral BID  . arformoterol  15 mcg Nebulization BID  . atorvastatin  40 mg Oral QHS  . budesonide (PULMICORT) nebulizer solution  0.5 mg Nebulization BID  . carvedilol  3.125 mg Oral BID WC  . Chlorhexidine Gluconate Cloth  6 each Topical Q0600  . digoxin  0.125 mg Oral Daily  . insulin aspart  0-15 Units Subcutaneous TID WC  . insulin aspart  0-5 Units Subcutaneous QHS  . mouth rinse  15 mL Mouth Rinse BID  . multivitamin with minerals  1 tablet Oral Daily  . senna-docusate  1 tablet Oral Daily  . sodium chloride flush  10-40 mL Intracatheter Q12H  . thiamine  100 mg Oral Daily    Infusions:   PRN Medications: ipratropium-albuterol, sennosides, sodium  chloride flush   Assessment/Plan   1. Acute on chronic systolic CHF: ECHO 05/23/15 EF 20-25% Mixed ischemic/nonischemic (ETOH) cardiomyopathy. S/p Medtronic CRT-D. Echo this admission with EF 30-35%.  Initially diuresed, now CVP 3-4 and has been off Lasix.  - Continue to hold diuretic today, Weight up today - will monitor but may need to restart diuretics, encourage po intake.  - Continue digoxin 0.125 daily.   - Continue Coreg 3.125 mg BID - Continue spironolactone 25 mg daily.  - Can continue losartan 12.5 daily but may have to discontinue again if further BUN/creatinine rise.  2. Acute on chronic hypercarbic resp failure: Intubated for airways protection and treated with Unasyn for possible aspiration.  Now extubated, off Unasyn.  - Has severe COPD on home oxygen at baseline.  Continue qhs bipap, suspect he needs this at home.   3. Atrial fibrillation:Chronic,s/p AV nodal ablation. - On Eliquis.   4. PAD:No claudication symptoms: Follows at [facility].  - No change to current plan.   5. CAD:  Severe LM stenosis found 1/19 =>not a CABG candidate due to severe COPD and not PCI candidate due to inability to use Impella due to severe PAD (have reviewed extensively with interventional cardiology).  No chest pain.  - No ASA with stable CAD and need for Eliquis.  - Continue statin.  6. Mitral valve replacement:Bioprosthetic valve currently, originally Bjork-Shiley. Valve looked ok onecho this admission.  7. ETOH abuse: Says he has quit drinking. - No change to current plan.   8. Smoking/COPD:On home oxygen, severe.  - Still smoking some. Needs complete cessation.  - No change to current plan.   9. AKI on CKD Stage 3: BUN/creatinine a bit higher today with low CVP.  Encourage po intake and hold diuretic. Creatinine improving with time.  10. Neuro: Baseline bipolar disorder and dementia.  Family says he "sleeps all the time" at home.  Suspect hypercarbia in setting of severe COPD is the  cause of this.  Delirium in the hospital.  - No change to current plan.   11. Metabolic Encephalopathy: PaCO2 elevated when intubated but actually lower than prior days, cannot explain all AMS by hypercarbia. NH3 mildly elevated. EEG 10/26/17 1. Moderate diffuse background slowing with occasional triphasic waves, 2. Occasional epileptiform discharges over the left midtemporal and left occipital regions.  We have been treating for hepatic encephalopathy with lactulose. Abdominal US, look for cirrhosis => showed increased hepatic echogenicity. He is more alert today.  - Continue Lactulose 30 mg g TID. Will continue lower dose at home.  12. Aspiration risk - plan for MBS today - ok to transport of tele.  Length of Stay: [address]  C. Rennis Golden, MD, [facility], FACP  [place]  [facility]  Medical Director of the Advanced Lipid Disorders &  Cardiovascular Risk Reduction Clinic Diplomate of the American Board of Clinical Lipidology Attending Cardiologist  Direct Dial: [phone]  Fax: [phone]  Website:  [email or web address]  Chrystie Nose, MD  10/30/2017, 9:51 AM  Advanced Heart Failure Team Pager [phone] (M-F; 7a - 4p)  Please contact [facility] for night-coverage after hours (4p -7a ) and weekends on [email or web address]

## 2017-10-30 NOTE — Progress Notes (Signed)
  Speech Language Pathology Treatment: Dysphagia  Patient Details Name: [name] MRN: [id number] DOB: Nov 24, 1945 Today's Date: 10/30/2017 Time: 9201-0071 SLP Time Calculation (min) (ACUTE ONLY): 9 min  Assessment / Plan / Recommendation Clinical Impression  Pt up in chair, alert, vocal quality clear and denying swallow difficulty. Throat clear x 1 following water which may or may not be significant however pt has history of dysphagia and was intubated day after initial BSE 5/20 with a very similar presentation to now. Recommend MBS to fully assess and determine if he is a silent aspirator which is scheduled for this afternoon.    HPI HPI: 72 y.o. male sent to ER for decreased appetite, increased fatigue and failure to thrive with generalized weakness. PMH includes: chronic combined HF, COPD, a fib, pacemaker placement, mitral valve replacement CAD, CVA, chronic respiratory failure, Dementia, on home oxygen. Pt had a BSE in 2013 recommending regular diet and thin liquids. In 2017 he was on Dys 3 diet and nectar thick liquids due to suspected respiratory-based dysphagia, although he was able to d/c home on soft solids and thin liquids. Normal MBS in 2002.      SLP Plan  MBS       Recommendations  Diet recommendations: Dysphagia 3 (mechanical soft);Thin liquid Liquids provided via: Cup;No straw Medication Administration: Whole meds with puree Supervision: Patient able to self feed;Full supervision/cueing for compensatory strategies Compensations: Minimize environmental distractions;Slow rate;Small sips/bites Postural Changes and/or Swallow Maneuvers: Seated upright 90 degrees                Oral Care Recommendations: Oral care BID Follow up Recommendations: 24 hour supervision/assistance SLP Visit Diagnosis: Dysphagia, unspecified (R13.10) Plan: MBS                       [name] 10/30/2017, 9:52 AM  [name] [name].Ed [employer]  [phone]

## 2017-10-30 NOTE — Progress Notes (Signed)
  Speech Language Pathology Treatment:  Patient Details Name: [name] MRN: [id number] DOB: 11-02-1945 Today's Date: 10/30/2017 Time: 4970-2637 SLP Time Calculation (min) (ACUTE ONLY): 9 min      Notified by radiology that they will likely be unable to do pt's MBS today. ST will continue efforts for MBS tomorrow. Continue current diet of Dys 3, thin liquids, no straws with precautions.                 GO                [name] 10/30/2017, 12:38 PM [name].Ed [employer] [phone]

## 2017-10-30 NOTE — Plan of Care (Signed)

## 2017-10-31 ENCOUNTER — Inpatient Hospital Stay (HOSPITAL_COMMUNITY): Payer: Medicare Other

## 2017-10-31 DIAGNOSIS — Z952 Presence of prosthetic heart valve: Secondary | ICD-10-CM

## 2017-10-31 DIAGNOSIS — Z8673 Personal history of transient ischemic attack (TIA), and cerebral infarction without residual deficits: Secondary | ICD-10-CM

## 2017-10-31 LAB — GLUCOSE, CAPILLARY
GLUCOSE-CAPILLARY: 176 mg/dL — AB (ref 65–99)
GLUCOSE-CAPILLARY: 235 mg/dL — AB (ref 65–99)
Glucose-Capillary: 326 mg/dL — ABNORMAL HIGH (ref 65–99)
Glucose-Capillary: 235 mg/dL — ABNORMAL HIGH (ref 65–99)

## 2017-10-31 LAB — BASIC METABOLIC PANEL
ANION GAP: 7 (ref 5–15)
BUN: 57 mg/dL — AB (ref 6–20)
CO2: 33 mmol/L — ABNORMAL HIGH (ref 22–32)
Calcium: 8.8 mg/dL — ABNORMAL LOW (ref 8.9–10.3)
Chloride: 94 mmol/L — ABNORMAL LOW (ref 101–111)
Creatinine, Ser: 1.6 mg/dL — ABNORMAL HIGH (ref 0.61–1.24)
GFR, EST AFRICAN AMERICAN: 49 mL/min — AB (ref >60)
GFR, EST NON AFRICAN AMERICAN: 42 mL/min — AB (ref >60)
Glucose, Bld: 163 mg/dL — ABNORMAL HIGH (ref 65–99)
POTASSIUM: 4.3 mmol/L (ref 3.5–5.1)
SODIUM: 134 mmol/L — AB (ref 135–145)

## 2017-10-31 MED ORDER — STARCH (THICKENING) PO POWD
ORAL | Status: DC | PRN
Start: 2017-10-31 — End: 2017-11-03
  Filled 2017-10-31: qty 227

## 2017-10-31 NOTE — Plan of Care (Signed)
Explained to patient's son about thickened liquids and puree diet. Son trying to give pt M&M's.

## 2017-10-31 NOTE — Progress Notes (Signed)
Modified Barium Swallow Progress Note  Patient Details  Name: [name] MRN: [id number] Date of Birth: 20-Sep-1945  Today's Date: 10/31/2017  Modified Barium Swallow completed.  Full report located under Chart Review in the Imaging Section.  Brief recommendations include the following:  Clinical Impression  Patient presents with moderate oropharyngeal dyphagia and aspiration of thin, nectar thick liquids due to delayed swallow initiation. Oral stage noted for piecemealing with liquids. Thin and nectar thick liquids spill into pt's open airway and are aspirated before the swallow. Moderate amount of aspiration was sensed, however pt does not sense trace aspiration events. Tongue base retraction mildly decreased, resulting in base of tongue, vallecular residue with thicker consistencies and solids. Clears with cued dry swallow, though pt is inconsistent in following commands for this maneuver. Chin tuck was typically effective in reducing penetration events with nectar, however pt again had difficulty following commands to complete this reliably, and it did not prevent penetration with straw sips or larger amounts of nectar. As pt was exhibiting questionable signs of aspiration prior to intubation, I suspect sensory component of his dysphagia may be related at least in part to dementia vs intubation, though certainly this may have exacerbated his issues acutely. Given his respiratory decline requiring intubation 5/21, would proceed with conservative diet to minimize aspiration risks during acute illness, with dys 3, honey thick liquids. If he is able to use a chin tuck reliably, would advance to nectar thick liquids. Consider repeat MBS with additional time post-extubation.   Swallow Evaluation Recommendations       SLP Diet Recommendations: Dysphagia 3 (Mech soft) solids;Honey thick liquids   Liquid Administration via: Cup   Medication Administration: Crushed with puree   Supervision:  Patient able to self feed   Compensations: Minimize environmental distractions;Slow rate;Small sips/bites;Multiple dry swallows after each bite/sip   Postural Changes: Seated upright at 90 degrees   Oral Care Recommendations: Oral care BID   Other Recommendations: Order thickener from pharmacy;Remove water pitcher;Prohibited food (jello, ice cream, thin soups)   Rondel Baton, [place], [employer] Pathologist [phone]  Arlana Lindau 10/31/2017,11:45 AM

## 2017-10-31 NOTE — Progress Notes (Signed)
Patient ID: [name], male   DOB: [birth year], 72 y.o.   MRN: [id number]     Advanced Heart Failure Rounding Note  PCP-Cardiologist: No primary care provider on file.   Subjective:    No complaints today. Remains net negative off diuretic. Creatinine further improved, but more slowly at 1.6 today. Received potassium yesterday with greater than expected response.   Objective:   Weight Range: 156 lb 8.4 oz (71 kg) Body mass index is 26.87 kg/m.   Vital Signs:   Temp:  [97.5 F (36.4 C)-98.5 F (36.9 C)] 97.6 F (36.4 C) (05/26 0813) Pulse Rate:  [56-72] 61 (05/26 0800) Resp:  [14-25] 15 (05/26 0800) BP: (109-140)/(50-88) 116/86 (05/26 0800) SpO2:  [88 %-99 %] 94 % (05/26 0800) Weight:  [156 lb 8.4 oz (71 kg)] 156 lb 8.4 oz (71 kg) (05/26 0515) Last BM Date: 10/30/17  Weight change: Filed Weights   10/29/17 0500 10/30/17 0300 10/31/17 0515  Weight: 142 lb 13.7 oz (64.8 kg) 155 lb 6.8 oz (70.5 kg) 156 lb 8.4 oz (71 kg)   Intake/Output:   Intake/Output Summary (Last 24 hours) at 10/31/2017 0845 Last data filed at 10/31/2017 0800 Gross per 24 hour  Intake 960 ml  Output 1091 ml  Net -131 ml    Physical Exam    General appearance: alert and no distress Neck: no carotid bruit, no JVD and thyroid not enlarged, symmetric, no tenderness/mass/nodules Lungs: diminished breath sounds bibasilar Heart: regular rate and rhythm Abdomen: soft, non-tender; bowel sounds normal; no masses,  no organomegaly Extremities: extremities normal, atraumatic, no cyanosis or edema Pulses: 2+ and symmetric Skin: pale, dry, flaky Neurologic: Mental status: Awake and alert, oriented to place and year, not month Psych: Pleasant  Telemetry   Afib with BiV pacing - personally reviewed.   Labs    CBC Recent Labs    10/29/17 0426  WBC 11.7*  HGB 13.2  HCT 44.1  MCV 89.3  PLT 183   Basic Metabolic Panel Recent Labs    36/62/94 0345 10/31/17 0343  NA 136 134*  K 3.5 4.3  CL 91*  94*  CO2 38* 33*  GLUCOSE 162* 163*  BUN 78* 57*  CREATININE 1.69* 1.60*  CALCIUM 8.9 8.8*   Liver Function Tests Recent Labs    10/29/17 0426  AST 26  ALT 16*  ALKPHOS 96  BILITOT 1.1  PROT 7.5  ALBUMIN 3.1*   No results for input(s): LIPASE, AMYLASE in the last 72 hours. Cardiac Enzymes No results for input(s): CKTOTAL, CKMB, CKMBINDEX, TROPONINI in the last 72 hours.  BNP: BNP (last 3 results) Recent Labs    02/01/17 1054 06/06/17 1037 10/21/17 1842  BNP 102.6* 486.4* 456.6*    ProBNP (last 3 results) Recent Labs    05/25/17 1152  PROBNP 1,108*     D-Dimer No results for input(s): DDIMER in the last 72 hours. Hemoglobin A1C No results for input(s): HGBA1C in the last 72 hours. Fasting Lipid Panel No results for input(s): CHOL, HDL, LDLCALC, TRIG, CHOLHDL, LDLDIRECT in the last 72 hours. Thyroid Function Tests No results for input(s): TSH, T4TOTAL, T3FREE, THYROIDAB in the last 72 hours.  Invalid input(s): FREET3  Other results:   Imaging    No results found.   Medications:     Scheduled Medications: . apixaban  5 mg Oral BID  . arformoterol  15 mcg Nebulization BID  . atorvastatin  40 mg Oral QHS  . budesonide (PULMICORT) nebulizer solution  0.5 mg  Nebulization BID  . carvedilol  3.125 mg Oral BID WC  . digoxin  0.125 mg Oral Daily  . insulin aspart  0-15 Units Subcutaneous TID WC  . insulin aspart  0-5 Units Subcutaneous QHS  . multivitamin with minerals  1 tablet Oral Daily  . senna-docusate  1 tablet Oral Daily  . sodium chloride flush  10-40 mL Intracatheter Q12H  . thiamine  100 mg Oral Daily    Infusions:   PRN Medications: ipratropium-albuterol, sennosides, sodium chloride flush   Assessment/Plan   1. Acute on chronic systolic CHF: ECHO 05/23/15 EF 20-25% Mixed ischemic/nonischemic (ETOH) cardiomyopathy. S/p Med[employer] CRT-D. Echo this admission with EF 30-35%.  Initially diuresed, now CVP 3-4 and has been off Lasix.    - Continue to hold diuretic  - weight up 1 lb, but does not appear volume up. Will likely need to restart maintenance diuretic tomorrow - Continue digoxin 0.125 daily.   - Continue Coreg 3.125 mg BID - Continue spironolactone 25 mg daily.  - Can continue losartan 12.5 daily but may have to discontinue again if further BUN/creatinine rise.  2. Acute on chronic hypercarbic resp failure: Intubated for airways protection and treated with Unasyn for possible aspiration.  Now extubated, off Unasyn.  - Has severe COPD on home oxygen at baseline.  Continue qhs bipap, suspect he needs this at home.   3. Atrial fibrillation:Chronic,s/p AV nodal ablation. - On Eliquis.   4. PAD:No claudication symptoms: Follows at [facility].  - No change to current plan.   5. CAD:  Severe LM stenosis found 1/19 =>not a CABG candidate due to severe COPD and not PCI candidate due to inability to use Impella due to severe PAD (have reviewed extensively with interventional cardiology).  No chest pain.  - No ASA with stable CAD and need for Eliquis.  - Continue statin.  6. Mitral valve replacement:Bioprosthetic valve currently, originally Bjork-Shiley. Valve looked ok onecho this admission.  7. ETOH abuse: Says he has quit drinking. - No change to current plan.   8. Smoking/COPD:On home oxygen, severe.  - Still smoking some. Needs complete cessation.  - No change to current plan.   9. AKI on CKD Stage 3: BUN/creatinine a bit higher today with low CVP.  Encourage po intake and hold diuretic. Creatinine improving with time.  10. Neuro: Baseline bipolar disorder and dementia.  Family says he "sleeps all the time" at home.  Suspect hypercarbia in setting of severe COPD is the cause of this.  Delirium in the hospital.  - No change to current plan.   11. Metabolic Encephalopathy: PaCO2 elevated when intubated but actually lower than prior days, cannot explain all AMS by hypercarbia. NH3 mildly elevated. EEG 10/26/17 1.  Moderate diffuse background slowing with occasional triphasic waves, 2. Occasional epileptiform discharges over the left midtemporal and left occipital regions.  We have been treating for hepatic encephalopathy with lactulose. Abdominal US, look for cirrhosis => showed increased hepatic echogenicity. He is more alert today.  - Continue Lactulose 30 mg g TID. Will continue lower dose at home.  12. Aspiration risk - plan for MBS today (could not accommodate yesterday) - ok to transport of tele.  Length of Stay: 10   Chrystie Nose, MD, [facility], FACP  [place]  [facility]  Medical Director of the Advanced Lipid Disorders &  Cardiovascular Risk Reduction Clinic Diplomate of the American Board of Clinical Lipidology Attending Cardiologist  Direct Dial: [phone]  Fax: [phone]  Website:  [email or web address]  Chrystie Nose, MD  10/31/2017, 8:45 AM  Advanced Heart Failure Team Pager [phone] (M-F; 7a - 4p)  Please contact [facility] for night-coverage after hours (4p -7a ) and weekends on [email or web address]

## 2017-11-01 DIAGNOSIS — F0280 Dementia in other diseases classified elsewhere without behavioral disturbance: Secondary | ICD-10-CM

## 2017-11-01 LAB — BASIC METABOLIC PANEL
ANION GAP: 7 (ref 5–15)
BUN: 36 mg/dL — ABNORMAL HIGH (ref 6–20)
CALCIUM: 9.3 mg/dL (ref 8.9–10.3)
CO2: 32 mmol/L (ref 22–32)
CREATININE: 1.05 mg/dL (ref 0.61–1.24)
Chloride: 102 mmol/L (ref 101–111)
Glucose, Bld: 147 mg/dL — ABNORMAL HIGH (ref 65–99)
Potassium: 5 mmol/L (ref 3.5–5.1)
SODIUM: 141 mmol/L (ref 135–145)

## 2017-11-01 LAB — GLUCOSE, CAPILLARY
GLUCOSE-CAPILLARY: 275 mg/dL — AB (ref 65–99)
GLUCOSE-CAPILLARY: 248 mg/dL — AB (ref 65–99)
Glucose-Capillary: 141 mg/dL — ABNORMAL HIGH (ref 65–99)

## 2017-11-01 MED ORDER — LACTULOSE 10 GM/15ML PO SOLN
30.0000 g | Freq: Three times a day (TID) | ORAL | Status: DC
  Administered 2017-11-01 (×3): 30 g via ORAL
  Filled 2017-11-01 (×3): qty 45

## 2017-11-01 NOTE — Progress Notes (Signed)
  Speech Language Pathology Treatment: Dysphagia  Patient Details Name: [name] MRN: [id number] DOB: 1945-10-28 Today's Date: 11/01/2017 Time: 2035-5974 SLP Time Calculation (min) (ACUTE ONLY): 8 min  Assessment / Plan / Recommendation Clinical Impression  Pt seen after MBS yesterday where he did not sense trace aspiration and minimal throat clear with aspiration of larger volume. Consumed pancake and honey thick liquids with moderate-max cues to perform strategy for second swallow. No s/s aspiration although may not be evident. Continue Dys 3, honey thick liquids and hope to see improvement in lung status. Continue ST and assist with nursing with meals for strategies.    HPI HPI: 72 y.o. male sent to ER for decreased appetite, increased fatigue and failure to thrive with generalized weakness. PMH includes: chronic combined HF, COPD, a fib, pacemaker placement, mitral valve replacement CAD, CVA, chronic respiratory failure, Dementia, on home oxygen. Pt had a BSE in 2013 recommending regular diet and thin liquids. In 2017 he was on Dys 3 diet and nectar thick liquids due to suspected respiratory-based dysphagia, although he was able to d/c home on soft solids and thin liquids. Normal MBS in 2002.      SLP Plan  Continue with current plan of care       Recommendations  Diet recommendations: Dysphagia 3 (mechanical soft);Honey-thick liquid Liquids provided via: Cup;No straw Medication Administration: Whole meds with puree Supervision: Patient able to self feed;Full supervision/cueing for compensatory strategies Compensations: Minimize environmental distractions;Slow rate;Small sips/bites;Multiple dry swallows after each bite/sip Postural Changes and/or Swallow Maneuvers: Seated upright 90 degrees                Oral Care Recommendations: Oral care BID Follow up Recommendations: Skilled Nursing facility SLP Visit Diagnosis: Dysphagia, oropharyngeal phase (R13.12) Plan: Continue  with current plan of care       GO                [name] 11/01/2017, 8:45 AM   [name].Ed [employer] [phone]

## 2017-11-01 NOTE — Progress Notes (Addendum)
Patient ID: [name], male   DOB: [birth year], 72 y.o.   MRN: [id number]     Advanced Heart Failure Rounding Note  PCP-Cardiologist: No primary care provider on file.   Subjective:    Sleepy today - seen and examined. On bipap QHS. I have signed an order for home BIPAP. Stable off diuretics. Creatinine now down to 1.05. Potassium is 5 today (has been rising). MBS study yesterday showed moderate oropharyngeal aspiration - recommended Dys 3 diet with honey thick liquids.  Objective:    Weight Range: 156 lb 8.4 oz (71 kg) Body mass index is 26.87 kg/m.   Vital Signs:   Temp:  [97.6 F (36.4 C)-98.8 F (37.1 C)] 98.2 F (36.8 C) (05/26 2336) Pulse Rate:  [57-62] 59 (05/27 0600) Resp:  [15-28] 24 (05/27 0600) BP: (106-165)/(47-140) 127/90 (05/27 0600) SpO2:  [86 %-99 %] 98 % (05/27 0600) Last BM Date: 10/31/17  Weight change: Filed Weights   10/29/17 0500 10/30/17 0300 10/31/17 0515  Weight: 142 lb 13.7 oz (64.8 kg) 155 lb 6.8 oz (70.5 kg) 156 lb 8.4 oz (71 kg)   Intake/Output:   Intake/Output Summary (Last 24 hours) at 11/01/2017 0735 Last data filed at 11/01/2017 0600 Gross per 24 hour  Intake 1020 ml  Output 1675 ml  Net -655 ml    Physical Exam    General appearance: no distress and asleep on bipap Neck: no carotid bruit, no JVD and thyroid not enlarged, symmetric, no tenderness/mass/nodules Lungs: diminished breath sounds bibasilar Heart: regular rate and rhythm Abdomen: soft, non-tender; bowel sounds normal; no masses,  no organomegaly Extremities: extremities normal, atraumatic, no cyanosis or edema Pulses: 2+ and symmetric Skin: Skin color, texture, turgor normal. No rashes or lesions Neurologic: Mental status: asleep on bipap Psych: cannot assess  Telemetry   Afib with BiV pacing - personally reviewed.   Labs    CBC No results for input(s): WBC, NEUTROABS, HGB, HCT, MCV, PLT in the last 72 hours. Basic Metabolic Panel Recent Labs     10/31/17 0343 11/01/17 0503  NA 134* 141  K 4.3 5.0  CL 94* 102  CO2 33* 32  GLUCOSE 163* 147*  BUN 57* 36*  CREATININE 1.60* 1.05  CALCIUM 8.8* 9.3   Liver Function Tests No results for input(s): AST, ALT, ALKPHOS, BILITOT, PROT, ALBUMIN in the last 72 hours. No results for input(s): LIPASE, AMYLASE in the last 72 hours. Cardiac Enzymes No results for input(s): CKTOTAL, CKMB, CKMBINDEX, TROPONINI in the last 72 hours.  BNP: BNP (last 3 results) Recent Labs    02/01/17 1054 06/06/17 1037 10/21/17 1842  BNP 102.6* 486.4* 456.6*    ProBNP (last 3 results) Recent Labs    05/25/17 1152  PROBNP 1,108*     D-Dimer No results for input(s): DDIMER in the last 72 hours. Hemoglobin A1C No results for input(s): HGBA1C in the last 72 hours. Fasting Lipid Panel No results for input(s): CHOL, HDL, LDLCALC, TRIG, CHOLHDL, LDLDIRECT in the last 72 hours. Thyroid Function Tests No results for input(s): TSH, T4TOTAL, T3FREE, THYROIDAB in the last 72 hours.  Invalid input(s): FREET3  Other results:   Imaging    Dg Swallowing Func-speech Pathology  Result Date: 10/31/2017 Objective Swallowing Evaluation: Type of Study: MBS-Modified Barium Swallow Study  Patient Details Name: DALBERT STILLINGS MRN: [id number] Date of Birth: 1946-02-14 Today's Date: 10/31/2017 Time: SLP Start Time (ACUTE ONLY): 1020 -SLP Stop Time (ACUTE ONLY): 1045 SLP Time Calculation (min) (ACUTE ONLY): 25 min Past  Medical History: Past Medical History: Diagnosis Date . AICD (automatic cardioverter/defibrillator) present  . Alcohol abuse  . Anxiety  . Bipolar disorder (HCC)  . Biventricular automatic implantable cardioverter defibrillator in situ   a. 01/2014 s/p MDT [id number] Viva XT CRT-D (ser # [id number]). . CAD (coronary artery disease)   a. s/p prior PCI/stenting of the LCX;  b. 08/2014 MV: EF 20%, large septal, apical, and inferior infarct from apex to base, no ischemia;  c. 08/2014 NSTEMI/Cath: LM mod distal dzs  extending into ostial LCX (80%), LAD tortuous, RI nl, OM mod dzs, RCA 100 CTO with R->R and L->R collats-->Med Rx. . Chronic atrial fibrillation (HCC)   a. CHA2DS2VASc = 6->eliquis;  b. S/P AVN RFCA an BiV ICD placement. . Chronic respiratory failure (HCC)  . Chronic systolic congestive heart failure (HCC)   a. 01/2014 Echo: EF 25-30%, mod conc LVH, mod dil LA. . CKD (chronic kidney disease)   a. suspect stage II-III based on historical labs. . Complete heart block (HCC)   a. In setting of prior AV nodal ablation r/t afib-->BiV ICD (01/2014). . COPD (chronic obstructive pulmonary disease) (HCC)  . CVA (cerebral vascular accident) (HCC)   a. Multiple prior embolic strokes. . Dementia  . Depression  . DVT (deep venous thrombosis) (HCC)  . Dyslipidemia  . Hyperglycemia  . Hypertension  . Mitral valve disease   a. remote mitral replacement with Bjork Shiley valve;  b. 06/2006 Redo MVR with tissue valve. . Mixed Ischemic and Nonischemic Cardiomyopathy   a. 8/.2015 Echo: Ef 25-30%;  b. 01/2014 s/p MDT [id number] [place]bot Grumbling XT CRT-D (ser # [id number]). . On home oxygen therapy   "2L; 24/7" (65/16/2019) . Psoriasis   "back of his head; elbows; finger of left hand" (06/07/2017) . Ventral hernia  Past Surgical History: Past Surgical History: Procedure Laterality Date . AV NODE ABLATION  2007 . CARDIAC CATHETERIZATION  06/2006  Mild ostial L main stenosis, CFX stent patent, RCA occluded (old) . CORONARY ARTERY BYPASS GRAFT  2008 . CYSTOSCOPY W/ URETERAL STENT PLACEMENT  07/12/2011  Procedure: CYSTOSCOPY WITH RETROGRADE PYELOGRAM/URETERAL STENT PLACEMENT;  Surgeon: Sebastian Ache, MD;  Location[facility];  Service: Urology;  Laterality: Left; . HERNIA REPAIR   . ICD GENERATOR CHANGE  02/01/2014  Gen change to: Medtronic VIVA pulse generator, serial number [id number] . INSERT / REPLACE / REMOVE PACEMAKER   . LEFT HEART CATHETERIZATION WITH CORONARY ANGIOGRAM N/A 08/23/2014  Procedure: LEFT HEART CATHETERIZATION WITH CORONARY ANGIOGRAM;  Surgeon:  Corky Crafts, MD;  Location: [facility] CATH LAB;  Service: Cardiovascular;  Laterality: N/A; . MITRAL VALVE REPLACEMENT    remote [facility] valve 9476 with redo tissue valve 06/2006 . PACEMAKER GENERATOR CHANGE N/A 02/01/2014  Procedure: PACEMAKER GENERATOR CHANGE;  Surgeon: Duke Salvia, MD;  Location: [facility] CATH LAB;  Service: Cardiovascular;  Laterality: N/A; . PACEMAKER PLACEMENT  2006  Changed to CRT-D in 2009 . RIGHT/LEFT HEART CATH AND CORONARY ANGIOGRAPHY N/A 06/09/2017  Procedure: RIGHT/LEFT HEART CATH AND CORONARY ANGIOGRAPHY;  Surgeon: [name], MD;  Location: [facility] INVASIVE CV LAB;  Service: Cardiovascular;  Laterality: N/A; HPI: 72 y.o. male sent to ER for decreased appetite, increased fatigue and failure to thrive with generalized weakness. PMH includes: chronic combined HF, COPD, a fib, pacemaker placement, mitral valve replacement CAD, CVA, chronic respiratory failure, Dementia, on home oxygen. Pt had a BSE in 2013 recommending regular diet and thin liquids. In 2017 he was on Dys 3 diet and nectar thick liquids due to  suspected respiratory-based dysphagia, although he was able to d/c home on soft solids and thin liquids. Normal MBS in 2002.  Subjective: alert, pleasantly confused Assessment / Plan / Recommendation CHL IP CLINICAL IMPRESSIONS 10/31/2017 Clinical Impression Patient presents with moderate oropharyngeal dyphagia and aspiration of thin, nectar thick liquids due to delayed swallow initiation. Oral stage noted for piecemealing with liquids. Thin and nectar thick liquids spill into pt's open airway and are aspirated before the swallow. Moderate amount of aspiration was sensed, however pt does not sense trace aspiration events. Tongue base retraction mildly decreased, resulting in base of tongue, vallecular residue with thicker consistencies and solids. Clears with cued dry swallow, though pt is inconsistent in following commands for this maneuver. Chin tuck was typically effective in  reducing penetration events with nectar, however pt again had difficulty following commands to complete this reliably, and it did not prevent penetration with straw sips or larger amounts of nectar. As pt was exhibiting questionable signs of aspiration prior to intubation, I suspect sensory component of his dysphagia may be related at least in part to dementia vs intubation, though certainly this may have exacerbated his issues acutely. Given his respiratory decline requiring intubation 5/21, would proceed with conservative diet to minimize aspiration risks during acute illness, with dys 3, honey thick liquids. If he is able to use a chin tuck reliably, would advance to nectar thick liquids. Consider repeat MBS with additional time post-extubation. SLP Visit Diagnosis Dysphagia, oropharyngeal phase (R13.12) Attention and concentration deficit following -- Frontal lobe and executive function deficit following -- Impact on safety and function Moderate aspiration risk   CHL IP TREATMENT RECOMMENDATION 10/31/2017 Treatment Recommendations Therapy as outlined in treatment plan below   Prognosis 10/31/2017 Prognosis for Safe Diet Advancement (No Data) Barriers to Reach Goals Cognitive deficits Barriers/Prognosis Comment -- CHL IP DIET RECOMMENDATION 10/31/2017 SLP Diet Recommendations Dysphagia 3 (Mech soft) solids;Honey thick liquids Liquid Administration via Cup Medication Administration Crushed with puree Compensations Minimize environmental distractions;Slow rate;Small sips/bites;Multiple dry swallows after each bite/sip Postural Changes Seated upright at 90 degrees   CHL IP OTHER RECOMMENDATIONS 10/31/2017 Recommended Consults -- Oral Care Recommendations Oral care BID Other Recommendations Order thickener from pharmacy;Remove water pitcher;Prohibited food (jello, ice cream, thin soups)   CHL IP FOLLOW UP RECOMMENDATIONS 10/31/2017 Follow up Recommendations Other (comment)   CHL IP FREQUENCY AND DURATION 10/31/2017 Speech  Therapy Frequency (ACUTE ONLY) min 2x/week Treatment Duration 2 weeks      CHL IP ORAL PHASE 10/31/2017 Oral Phase Impaired Oral - Pudding Teaspoon -- Oral - Pudding Cup -- Oral - Honey Teaspoon Lingual/palatal residue;Piecemeal swallowing Oral - Honey Cup Lingual/palatal residue;Piecemeal swallowing Oral - Nectar Teaspoon -- Oral - Nectar Cup Lingual/palatal residue;Piecemeal swallowing Oral - Nectar Straw Lingual/palatal residue;Piecemeal swallowing Oral - Thin Teaspoon Lingual/palatal residue;Piecemeal swallowing Oral - Thin Cup Lingual/palatal residue;Piecemeal swallowing Oral - Thin Straw -- Oral - Puree Lingual/palatal residue Oral - Mech Soft -- Oral - Regular Lingual/palatal residue Oral - Multi-Consistency -- Oral - Pill (No Data) Oral Phase - Comment --  CHL IP PHARYNGEAL PHASE 10/31/2017 Pharyngeal Phase Impaired Pharyngeal- Pudding Teaspoon -- Pharyngeal -- Pharyngeal- Pudding Cup -- Pharyngeal -- Pharyngeal- Honey Teaspoon Reduced tongue base retraction;Pharyngeal residue - valleculae;Delayed swallow initiation-vallecula Pharyngeal -- Pharyngeal- Honey Cup Reduced tongue base retraction;Pharyngeal residue - valleculae;Delayed swallow initiation-vallecula Pharyngeal -- Pharyngeal- Nectar Teaspoon Delayed swallow initiation-pyriform sinuses;Penetration/Aspiration before swallow;Penetration/Aspiration during swallow;Trace aspiration;Pharyngeal residue - valleculae Pharyngeal Material enters airway, remains ABOVE vocal cords and not ejected out Pharyngeal- Nectar Cup  Delayed swallow initiation-pyriform sinuses;Penetration/Aspiration before swallow;Penetration/Aspiration during swallow;Trace aspiration Pharyngeal Material enters airway, passes BELOW cords without attempt by patient to eject out (silent aspiration);Material enters airway, remains ABOVE vocal cords and not ejected out Pharyngeal- Nectar Straw Delayed swallow initiation-pyriform sinuses;Reduced tongue base retraction;Penetration/Aspiration before  swallow;Penetration/Aspiration during swallow;Trace aspiration;Pharyngeal residue - valleculae Pharyngeal -- Pharyngeal- Thin Teaspoon Delayed swallow initiation-pyriform sinuses;Penetration/Aspiration before swallow;Trace aspiration Pharyngeal Material enters airway, passes BELOW cords without attempt by patient to eject out (silent aspiration) Pharyngeal- Thin Cup Delayed swallow initiation-pyriform sinuses;Reduced tongue base retraction;Penetration/Aspiration before swallow;Moderate aspiration;Pharyngeal residue - valleculae Pharyngeal Material enters airway, passes BELOW cords and not ejected out despite cough attempt by patient Pharyngeal- Thin Straw -- Pharyngeal -- Pharyngeal- Puree Delayed swallow initiation-vallecula;Reduced tongue base retraction;Pharyngeal residue - valleculae Pharyngeal -- Pharyngeal- Mechanical Soft -- Pharyngeal -- Pharyngeal- Regular Delayed swallow initiation-vallecula;Reduced tongue base retraction;Pharyngeal residue - valleculae Pharyngeal -- Pharyngeal- Multi-consistency -- Pharyngeal -- Pharyngeal- Pill Delayed swallow initiation-vallecula;Reduced tongue base retraction;Pharyngeal residue - valleculae Pharyngeal -- Pharyngeal Comment --  CHL IP CERVICAL ESOPHAGEAL PHASE 10/31/2017 Cervical Esophageal Phase WFL Pudding Teaspoon -- Pudding Cup -- Honey Teaspoon -- Honey Cup -- Nectar Teaspoon -- Nectar Cup -- Nectar Straw -- Thin Teaspoon -- Thin Cup -- Thin Straw -- Puree -- Mechanical Soft -- Regular -- Multi-consistency -- Pill -- Cervical Esophageal Comment -- Rondel Baton, MS, CCC-SLP Speech-Language Pathologist [phone] No flowsheet data found. Arlana Lindau 10/31/2017, 11:46 AM                Medications:     Scheduled Medications: . apixaban  5 mg Oral BID  . arformoterol  15 mcg Nebulization BID  . atorvastatin  40 mg Oral QHS  . budesonide (PULMICORT) nebulizer solution  0.5 mg Nebulization BID  . carvedilol  3.125 mg Oral BID WC  . digoxin  0.125 mg Oral  Daily  . insulin aspart  0-15 Units Subcutaneous TID WC  . insulin aspart  0-5 Units Subcutaneous QHS  . multivitamin with minerals  1 tablet Oral Daily  . senna-docusate  1 tablet Oral Daily  . sodium chloride flush  10-40 mL Intracatheter Q12H  . thiamine  100 mg Oral Daily    Infusions:   PRN Medications: food thickener, ipratropium-albuterol, sennosides, sodium chloride flush   Assessment/Plan   1. Acute on chronic systolic CHF: ECHO 05/23/15 EF 20-25% Mixed ischemic/nonischemic (ETOH) cardiomyopathy. S/p Medtronic CRT-D. Echo this admission with EF 30-35%.  Initially diuresed, now CVP 3-4 and has been off Lasix.  - Continue to hold diuretic  - weight up 1 lb, but does not appear volume up. - Continue digoxin 0.125 daily.   - Continue Coreg 3.125 mg BID - spironolactone and losartan on hold 2. Acute on chronic hypercarbic resp failure: Intubated for airways protection and treated with Unasyn for possible aspiration.  Now extubated, off Unasyn.  - Has severe COPD on home oxygen at baseline.  Continue qhs bipap, suspect he needs this at home.   3. Atrial fibrillation:Chronic,s/p AV nodal ablation. - On Eliquis.   4. PAD:No claudication symptoms: Follows at [facility].  - No change to current plan.   5. CAD:  Severe LM stenosis found 1/19 =>not a CABG candidate due to severe COPD and not PCI candidate due to inability to use Impella due to severe PAD (have reviewed extensively with interventional cardiology).  No chest pain.  - No ASA with stable CAD and need for Eliquis.  - Continue statin.  6. Mitral valve replacement:Bioprosthetic valve  currently, originally Bjork-Shiley. Valve looked ok onecho this admission.  7. ETOH abuse: Says he has quit drinking. - No change to current plan.   8. Smoking/COPD:On home oxygen, severe.  - Still smoking some. Needs complete cessation.  - No change to current plan.   9. AKI on CKD Stage 3: BUN/creatinine a bit higher today with low  CVP.  Encourage po intake and hold diuretic. Creatinine improving with time.  10. Neuro: Baseline bipolar disorder and dementia.  Family says he "sleeps all the time" at home.  Suspect hypercarbia in setting of severe COPD is the cause of this.  Delirium in the hospital.  - No change to current plan.   11. Metabolic Encephalopathy: PaCO2 elevated when intubated but actually lower than prior days, cannot explain all AMS by hypercarbia. NH3 mildly elevated. EEG 10/26/17 1. Moderate diffuse background slowing with occasional triphasic waves, 2. Occasional epileptiform discharges over the left midtemporal and left occipital regions.  We have been treating for hepatic encephalopathy with lactulose. Abdominal US, look for cirrhosis => showed increased hepatic echogenicity. He is more alert today.  12. Aspiration risk - moderate aspiration risk by MBS - dys 3 honey thick 13. Relative hyperkalemia - K+ of 5 today - not on supplements, aldactone stopped, losartan stopped. - restart lactulose 30g TID.  Ok to transfer to floor today on telemetry. Will ask case management for placement assistance and ordering home BIPAP. I have signed forms. Anticipate d/c likely tomorrow.  Criteria for noninvasive ventilation:  Patient continues to exhibit signs of hypercapnia associated with chronic hypercarbic respiratory failure and severe restrictive lung disease.  The patient requires the use of NIV at night to assist with CO2 retention.  Patient will need these advanced settings in conjunction with his current medication regimen.  BiPAP is not an option to to its functional limitations in the severity of the patient's condition.  We will for use over 24-hour.  Could lead to death.  Length of Stay: 11   Chrystie Nose, MD, [facility], FACP  [place]  [facility]  Medical Director of the Advanced Lipid Disorders &  Cardiovascular Risk Reduction Clinic Diplomate of the American Board of Clinical Lipidology Attending  Cardiologist  Direct Dial: [phone]  Fax: [phone]  Website:  [email or web address]  Chrystie Nose, MD  11/01/2017, 7:35 AM  Advanced Heart Failure Team Pager [phone] (M-F; 7a - 4p)  Please contact [facility] for night-coverage after hours (4p -7a ) and weekends on [email or web address]

## 2017-11-01 NOTE — Progress Notes (Signed)
Occupational Therapy Treatment Patient Details Name: [name] MRN: [id number] DOB: [birth year] Today's Date: 11/01/2017    History of present illness 72 y.o. male admitted 5/16 with decreased appetite, increased fatigue and FTT, respiratory failure with acute CHF on COPD with decline requiring intubation 5/21-5/23. PMH includes: chronic combined HF, COPD, a fib, pacemaker placement, mitral valve replacement CAD, CVA, chronic respiratory failure, Dementia, on home oxygen,    OT comments  Pt progressing towards established OT goals. Pt performing grooming at sink and functional mobility with Min Guard A for safety. Pt requiring cues for transitioning between ADL tasks and benefited from a calm environment. Continue to recommend dc home with HHOT and will continue to follow acutely as admitted. Updated OT goals.    Follow Up Recommendations  Home health OT;Supervision/Assistance - 24 hour    Equipment Recommendations  None recommended by OT    Recommendations for Other Services      Precautions / Restrictions Precautions Precautions: Fall Restrictions Weight Bearing Restrictions: No       Mobility Bed Mobility Overal bed mobility: Needs Assistance Bed Mobility: Supine to Sit     Supine to sit: Supervision     General bed mobility comments: Increased time and supervision for safety  Transfers Overall transfer level: Needs assistance Equipment used: Rolling walker (2 wheeled) Transfers: Sit to/from Stand Sit to Stand: Min guard         General transfer comment: Min Guard for safety    Balance Overall balance assessment: Needs assistance Sitting-balance support: No upper extremity supported Sitting balance-Leahy Scale: Good     Standing balance support: No upper extremity supported Standing balance-Leahy Scale: Fair Standing balance comment: Pt able to stand without UE support during sit>stand transfer                           ADL either  performed or assessed with clinical judgement   ADL Overall ADL's : Needs assistance/impaired Eating/Feeding: Set up;Sitting Eating/Feeding Details (indicate cue type and reason): Pt performing self feeding with set up and seated in recliner. demonstrating decreased pinch strength and required increased time to open salt packet Grooming: Brushing hair;Wash/dry face;Wash/dry hands;Min guard;Standing Grooming Details (indicate cue type and reason): Pt performing grooming tasks at sink with MIn Guard A for safety. Pt requiring cues to transition between multple tasks and demosntrating poor ST memory.                  Toilet Transfer: Min guard;Ambulation;RW(Simulated to recliner) Armed forces technical officer Details (indicate cue type and reason): Min Guard for safety and VCs to back complete up to the chair         Functional mobility during ADLs: Min guard;Rolling walker General ADL Comments: Pt demonstrating increased funcitonal performance. Performing grooming tasks and functional mobility with Min Guard A for safety. Pt requiring simple direct cues and very pleasant     Vision       Perception     Praxis      Cognition Arousal/Alertness: Awake/alert Behavior During Therapy: Flat affect Overall Cognitive Status: Impaired/Different from baseline Area of Impairment: Orientation;Awareness;Attention;Memory;Safety/judgement                 Orientation Level: Disoriented to;Time;Situation Current Attention Level: Sustained Memory: Decreased short-term memory Following Commands: Follows one step commands consistently Safety/Judgement: Decreased awareness of safety;Decreased awareness of deficits Awareness: Intellectual   General Comments: Pt requiring simple, direct one-step commands. Pt unable to recall ADL plan  and requriing transitional cues throughout to perform grooming tasks. Benefits from quiet environment        Exercises     Shoulder Instructions       General  Comments VSS. Pt's daughter leaving at begining of session, but confirmed that pt will have good support at home    Pertinent Vitals/ Pain       Pain Assessment: Faces Faces Pain Scale: No hurt Pain Intervention(s): Monitored during session  Home Living                                          Prior Functioning/Environment              Frequency  Min 2X/week        Progress Toward Goals  OT Goals(current goals can now be found in the care plan section)  Progress towards OT goals: Progressing toward goals;Goals met and updated - see care plan  Acute Rehab OT Goals Patient Stated Goal: Go home OT Goal Formulation: With family Time For Goal Achievement: 11/08/17 Potential to Achieve Goals: Good ADL Goals Pt Will Perform Eating: with supervision;sitting(when cleared to begin PO) Pt Will Perform Grooming: with modified independence;standing Pt Will Perform Upper Body Dressing: with supervision;sitting Pt Will Perform Lower Body Dressing: sit to/from stand;with set-up;with supervision Pt Will Transfer to Toilet: with supervision;bedside commode;ambulating Pt Will Perform Toileting - Clothing Manipulation and hygiene: with supervision;sit to/from stand  Plan Discharge plan remains appropriate    Co-evaluation                 AM-PAC PT "6 Clicks" Daily Activity     Outcome Measure   Help from another person eating meals?: A Little Help from another person taking care of personal grooming?: A Little Help from another person toileting, which includes using toliet, bedpan, or urinal?: A Little Help from another person bathing (including washing, rinsing, drying)?: A Little Help from another person to put on and taking off regular upper body clothing?: A Little Help from another person to put on and taking off regular lower body clothing?: A Little 6 Click Score: 18    End of Session Equipment Utilized During Treatment: Gait belt;Oxygen;Rolling  walker  OT Visit Diagnosis: Unsteadiness on feet (R26.81);Other abnormalities of gait and mobility (R26.89);Muscle weakness (generalized) (M62.81);Cognitive communication deficit (R41.841)   Activity Tolerance Patient tolerated treatment well   Patient Left in chair;with call bell/phone within reach   Nurse Communication Mobility status(No chair alarm)        Time: 7026-3785 OT Time Calculation (min): 15 min  Charges: OT General Charges $OT Visit: 1 Visit OT Treatments $Self Care/Home Management : 8-22 mins  [place], OTR/L Acute Rehab Pager: [phone] Office: [place] 11/01/2017, 5:14 PM

## 2017-11-01 NOTE — Care Management (Signed)
[name] with [facility] aware BiPAP paperwork signed and on chart.   Ronny Flurry RN bsn [phone]

## 2017-11-02 DIAGNOSIS — J439 Emphysema, unspecified: Secondary | ICD-10-CM

## 2017-11-02 DIAGNOSIS — J9622 Acute and chronic respiratory failure with hypercapnia: Secondary | ICD-10-CM

## 2017-11-02 DIAGNOSIS — J449 Chronic obstructive pulmonary disease, unspecified: Secondary | ICD-10-CM

## 2017-11-02 LAB — GLUCOSE, CAPILLARY
GLUCOSE-CAPILLARY: 132 mg/dL — AB (ref 65–99)
GLUCOSE-CAPILLARY: 180 mg/dL — AB (ref 65–99)
GLUCOSE-CAPILLARY: 166 mg/dL — AB (ref 65–99)
Glucose-Capillary: 205 mg/dL — ABNORMAL HIGH (ref 65–99)
Glucose-Capillary: 271 mg/dL — ABNORMAL HIGH (ref 65–99)
Glucose-Capillary: 215 mg/dL — ABNORMAL HIGH (ref 65–99)

## 2017-11-02 LAB — BASIC METABOLIC PANEL
Anion gap: 6 (ref 5–15)
BUN: 21 mg/dL — AB (ref 6–20)
CALCIUM: 9.4 mg/dL (ref 8.9–10.3)
CO2: 30 mmol/L (ref 22–32)
CREATININE: 1 mg/dL (ref 0.61–1.24)
Chloride: 108 mmol/L (ref 101–111)
GFR calc Af Amer: >60 mL/min (ref >60)
Glucose, Bld: 157 mg/dL — ABNORMAL HIGH (ref 65–99)
Potassium: 4.8 mmol/L (ref 3.5–5.1)
SODIUM: 144 mmol/L (ref 135–145)

## 2017-11-02 MED ORDER — LOSARTAN POTASSIUM 25 MG PO TABS
12.5000 mg | ORAL_TABLET | Freq: Every day | ORAL | Status: DC
  Administered 2017-11-02 – 2017-11-03 (×2): 12.5 mg via ORAL
  Filled 2017-11-02 (×3): qty 1

## 2017-11-02 MED ORDER — LACTULOSE 10 GM/15ML PO SOLN
30.0000 g | Freq: Two times a day (BID) | ORAL | Status: DC
  Administered 2017-11-02 – 2017-11-03 (×3): 30 g via ORAL
  Filled 2017-11-02 (×3): qty 45

## 2017-11-02 MED ORDER — TORSEMIDE 20 MG PO TABS: 40.0000 mg | ORAL_TABLET | Freq: Every day | ORAL | Status: DC

## 2017-11-02 MED ORDER — TORSEMIDE 20 MG PO TABS
20.0000 mg | ORAL_TABLET | Freq: Every day | ORAL | Status: DC
  Administered 2017-11-02: 20 mg via ORAL
  Filled 2017-11-02: qty 1
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
## 2017-11-02 NOTE — Care Management Note (Addendum)
Case Management Note  Patient Details  Name: [name] MRN: [id number] Date of Birth: 1946/02/04  Subjective/Objective:                Spoke with patient at bedside who deferred to daughter. Called daughter. She confirmed patient lives with her. He has home oxygen through [facility], they would like to use [facility] for [facility] as well. Patient is to have NIV for home use, waiting for approval per [name] at [facility] 5/28 13:40. [name] to request update/ expedite in hopes of DC in next 1-2 days.  Anticipate transfer to stepdown/ PCU today.    Hall,[name] Daughter   [phone]     Action/Plan:  Needs: NIV delivered to room/ family educated on use. HH orders and referral to [facility].   NIV may take 5-7 days, so as to not hold up DC [facility] can deliver BiPAP to room prior to NIV being approved. They will need BiPAP order with settings specified.   Expected Discharge Date:                  Expected Discharge Plan:  Home w Home Health Services  In-House Referral:     Discharge planning Services  CM Consult  Post Acute Care Choice:  Home Health, Durable Medical Equipment Choice offered to:  Patient, Adult Children  DME Arranged:  NIV Hospital bed, nebulizer, bipap DME Agency:  [facility]  HH Arranged:   RN PT OT HHA SW [facility] Agency:  [facility]  Status of Service:  COMPLETE  If discussed at Long Length of Stay Meetings, dates discussed:    Additional Comments:  Lawerance Sabal, RN 11/02/2017, 1:38 PM

## 2017-11-02 NOTE — Progress Notes (Addendum)
Patient ID: [name], male   DOB: 04-22-1946, 72 y.o.   MRN: [id number]     Advanced Heart Failure Rounding Note  PCP-Cardiologist: No primary care provider on file.   Subjective:    ABG 5/19 => 7.29/100/72.  Bipap started, repeat ABG 7.43/73/83.    Decompensated 10/26/17 with AMS although PCO2 was actually a bit lower than the prior day. Intubated for airway protection and moved to ICU. NH3 58.  EEG with diffuse cerebral dysfunction. Lactulose started.   Extubated 10/28/17.    BMS 10/31/17 with moderate oropharyngeal aspiration. Dysphagia 3 diet.   Creatinine 1.0. K 4.8.  Seated in chair this am. Denies SOB or CP. Awake and alert. Plans to go home with his daughter on discharge.   EEG 10/26/17 1. Moderate diffuse background slowing with occasional triphasic waves 2. Occasional epileptiform discharges over the left midtemporal and left occipital regions ? Metabolic encephalopathy.  Objective:   Weight Range: 156 lb 4.9 oz (70.9 kg) Body mass index is 26.83 kg/m.   Vital Signs:   Temp:  [97.5 F (36.4 C)-98.5 F (36.9 C)] 98.2 F (36.8 C) (05/28 0409) Pulse Rate:  [57-65] 59 (05/28 0600) Resp:  [16-26] 21 (05/28 0600) BP: (92-154)/(48-69) 125/65 (05/28 0600) SpO2:  [87 %-99 %] 98 % (05/28 0600) Weight:  [156 lb 4.9 oz (70.9 kg)] 156 lb 4.9 oz (70.9 kg) (05/28 0600) Last BM Date: 11/01/17  Weight change: Filed Weights   10/30/17 0300 10/31/17 0515 11/02/17 0600  Weight: 155 lb 6.8 oz (70.5 kg) 156 lb 8.4 oz (71 kg) 156 lb 4.9 oz (70.9 kg)   Intake/Output:   Intake/Output Summary (Last 24 hours) at 11/02/2017 0713 Last data filed at 11/01/2017 2136 Gross per 24 hour  Intake 890 ml  Output 727 ml  Net 163 ml    Physical Exam    General: NAD HEENT: Normal Neck: Supple. JVP 6-7 cm. Carotids 2+ bilat; no bruits. No thyromegaly or nodule noted. Cor: PMI nondisplaced. RRR, No M/G/R noted Lungs: CTAB, normal effort. Abdomen: Soft, non-tender, non-distended, no  HSM. No bruits or masses. +BS  Extremities: No cyanosis, clubbing, or rash. R and LLE no edema.  Neuro: Alert & orientedx3, cranial nerves grossly intact. moves all 4 extremities w/o difficulty. Affect pleasant   Telemetry   Afib with BiV pacing, personally reviewed.   Labs    CBC No results for input(s): WBC, NEUTROABS, HGB, HCT, MCV, PLT in the last 72 hours. Basic Metabolic Panel Recent Labs    40/98/11 0503 11/02/17 0329  NA 141 144  K 5.0 4.8  CL 102 108  CO2 32 30  GLUCOSE 147* 157*  BUN 36* 21*  CREATININE 1.05 1.00  CALCIUM 9.3 9.4   Liver Function Tests No results for input(s): AST, ALT, ALKPHOS, BILITOT, PROT, ALBUMIN in the last 72 hours. No results for input(s): LIPASE, AMYLASE in the last 72 hours. Cardiac Enzymes No results for input(s): CKTOTAL, CKMB, CKMBINDEX, TROPONINI in the last 72 hours.  BNP: BNP (last 3 results) Recent Labs    02/01/17 1054 06/06/17 1037 10/21/17 1842  BNP 102.6* 486.4* 456.6*    ProBNP (last 3 results) Recent Labs    05/25/17 1152  PROBNP 1,108*   D-Dimer No results for input(s): DDIMER in the last 72 hours. Hemoglobin A1C No results for input(s): HGBA1C in the last 72 hours. Fasting Lipid Panel No results for input(s): CHOL, HDL, LDLCALC, TRIG, CHOLHDL, LDLDIRECT in the last 72 hours. Thyroid Function Tests No results  for input(s): TSH, T4TOTAL, T3FREE, THYROIDAB in the last 72 hours.  Invalid input(s): FREET3  Other results:  Imaging   No results found.  Medications:     Scheduled Medications: . apixaban  5 mg Oral BID  . arformoterol  15 mcg Nebulization BID  . atorvastatin  40 mg Oral QHS  . budesonide (PULMICORT) nebulizer solution  0.5 mg Nebulization BID  . carvedilol  3.125 mg Oral BID WC  . digoxin  0.125 mg Oral Daily  . insulin aspart  0-15 Units Subcutaneous TID WC  . insulin aspart  0-5 Units Subcutaneous QHS  . lactulose  30 g Oral TID  . multivitamin with minerals  1 tablet Oral  Daily  . senna-docusate  1 tablet Oral Daily  . sodium chloride flush  10-40 mL Intracatheter Q12H  . thiamine  100 mg Oral Daily    Infusions:   PRN Medications: food thickener, ipratropium-albuterol, sennosides, sodium chloride flush  Assessment/Plan   1. Acute on chronic systolic CHF: ECHO 05/23/15 EF 20-25% Mixed ischemic/nonischemic (ETOH) cardiomyopathy. S/p Medtronic CRT-D. Echo this admission with EF 30-35%.   - Volume status looks OK on exam.  - Has been off diuretic. Was on torsemide 60 mg daily at home PTA.  - Continue digoxin 0.125 daily.   - Continue Coreg 3.125 mg BID - Off losartan with AKI.  - Off spironolactone with hyper K.  2. Acute on chronic hypercarbic resp failure: - Extubated 10/28/17 - Requiring BiPAP QHS. Will need for home. Order signed.  3. Atrial fibrillation:Chronic,s/p AV nodal ablation. - Now on Eliquis.  4. PAD:No claudication symptoms: Follows at [facility].  - No change to current plan.   5. CAD:  Severe LM stenosis found 1/19 =>not a CABG candidate due to severe COPD and not PCI candidate due to inability to use Impella due to severe PAD (have reviewed extensively with interventional cardiology).   - No s/s of ischemia.    - No ASA with stable CAD and need for Eliquis.  - Continue statin.  6. Mitral valve replacement:Bioprosthetic valve currently, originally Bjork-Shiley.  - Stable on Echo this admission.  7. ETOH abuse: Says he has quit drinking. - No change to current plan.   8. Smoking/COPD:On home oxygen, severe.  - Still smoking some. Needs complete cessation.  - No change to current plan.   9. AKI on CKD Stage 3:  - Losartan held with Hyper K. Will discuss adding low dose back with MD. Holding spiro.  10. Neuro: Baseline bipolar disorder and dementia.  Family says he "sleeps all the time" at home.  Suspect hypercarbia in setting of severe COPD is the cause of this.  Delirium in the hospital.  - No change to current plan.   - PT  recommending HHPT.  11. Metabolic Encephalopathy: PaCO2 elevated when intubated but actually lower than prior days, cannot explain all AMS by hypercarbia. NH3 mildly elevated.  - EEG 10/26/17 1. Moderate diffuse background slowing with occasional triphasic waves, 2. Occasional epileptiform discharges over the left midtemporal and left occipital regions.  We have been treating for hepatic encephalopathy with lactulose. Abdominal US, look for cirrhosis => showed increased hepatic echogenicity. He is alert today.  - Continue Lactulose 30 mg g TID. Will continue lower dose for home.   Possibly home today.   Length of Stay: [address]  [employer]  11/02/2017, 7:13 AM  Advanced Heart Failure Team Pager [phone] (M-F; 7a - 4p)  Please contact [facility] for  night-coverage after hours (4p -7a ) and weekends on [email or web address]  Patient seen with PA, agree with the above note.  He is awake and alert this morning.  No dyspnea or chest pain. Eating breakfast.  Volume status looks ok on exam.  Prior to admission, he was taking torsemide 60 mg daily.  Suspect this does is too high.  I am going to start him back on torsemide 20 mg daily for now and follow.  Will also restart losartan 12.5 mg daily.  Will have him seen by PT to determine home versus rehab facility.  Plan for discharge tomorrow, will need Bipap at home.  Can decrease lactulose to bid.   Marca Ancona 11/02/2017 7:54 AM

## 2017-11-02 NOTE — Progress Notes (Signed)
PT transferred to 4E12 in stable condition. VSS. Pt moved to bedside tele monitor with RN Matt in room. Modena Jansky RN 2 Heart CVICU

## 2017-11-02 NOTE — Progress Notes (Signed)
Physical Therapy Treatment Patient Details Name: [name] MRN: [id number] DOB: [birth year] Today's Date: 11/02/2017    History of Present Illness 72 y.o. male admitted 5/16 with decreased appetite, increased fatigue and FTT, respiratory failure with acute CHF on COPD with decline requiring intubation 5/21-5/23. PMH includes: chronic combined HF, COPD, a fib, pacemaker placement, mitral valve replacement CAD, CVA, chronic respiratory failure, Dementia, on home oxygen,     PT Comments    Pt progressing well. Pt with increased ambulation tolerance this date. Pt functioning at min guard level. Son reports that his sister is there most of the time but initially someone else with be there until he is safe to be home alone.    Follow Up Recommendations  Home health PT;Supervision/Assistance - 24 hour     Equipment Recommendations  Rolling walker with 5" wheels    Recommendations for Other Services OT consult     Precautions / Restrictions Precautions Precautions: Fall Restrictions Weight Bearing Restrictions: No    Mobility  Bed Mobility               General bed mobility comments: pt up in chair upon PT arrival  Transfers Overall transfer level: Needs assistance Equipment used: Rolling walker (2 wheeled) Transfers: Sit to/from Stand Sit to Stand: Min assist         General transfer comment: v/c's to push up from chair and reach back for chair  Ambulation/Gait Ambulation/Gait assistance: Min assist(son pushed chair behind [place] but did not need it) Ambulation Distance (Feet): 500 Feet Assistive device: Rolling walker (2 wheeled) Gait Pattern/deviations: Step-through pattern;Decreased stride length Gait velocity: dec Gait velocity interpretation: <1.31 ft/sec, indicative of household ambulator General Gait Details: v/c's to stand more erect and not to push walker to far forward, pt with decreased step height.  max directional v/c's   Stairs              Wheelchair Mobility    Modified Rankin (Stroke Patients Only)       Balance Overall balance assessment: Needs assistance Sitting-balance support: No upper extremity supported Sitting balance-Leahy Scale: Good     Standing balance support: No upper extremity supported Standing balance-Leahy Scale: Fair Standing balance comment: Pt able to stand without UE support during sit>stand transfer                            Cognition Arousal/Alertness: Awake/alert Behavior During Therapy: Flat affect(depressed spirits, anxious re: falling and pain with R LE WB) Overall Cognitive Status: Impaired/Different from baseline Area of Impairment: Problem solving                 Orientation Level: Disoriented to;Time;Situation Current Attention Level: Sustained Memory: Decreased short-term memory Following Commands: Follows one step commands with increased time   Awareness: Intellectual Problem Solving: Slow processing;Decreased initiation;Difficulty sequencing;Requires verbal cues;Requires tactile cues        Exercises      General Comments General comments (skin integrity, edema, etc.): VSS, pt on 4lO2 via [place] and SPO2 >92% during ambulation      Pertinent Vitals/Pain Pain Assessment: No/denies pain    Home Living                      Prior Function            PT Goals (current goals can now be found in the care plan section) Progress towards PT goals: Progressing toward goals  Frequency    Min 3X/week      PT Plan Current plan remains appropriate    Co-evaluation              AM-PAC PT "6 Clicks" Daily Activity  Outcome Measure  Difficulty turning over in bed (including adjusting bedclothes, sheets and blankets)?: A Little Difficulty moving from lying on back to sitting on the side of the bed? : A Little Difficulty sitting down on and standing up from a chair with arms (e.g., wheelchair, bedside commode, etc,.)?: A  Little Help needed moving to and from a bed to chair (including a wheelchair)?: A Little Help needed walking in hospital room?: A Little Help needed climbing 3-5 steps with a railing? : A Lot 6 Click Score: 17    End of Session Equipment Utilized During Treatment: Gait belt;Oxygen Activity Tolerance: Patient tolerated treatment well Patient left: in chair;with chair alarm set;with family/visitor present;with call bell/phone within reach Nurse Communication: Mobility status PT Visit Diagnosis: Unsteadiness on feet (R26.81)     Time: 1014-1030 PT Time Calculation (min) (ACUTE ONLY): 16 min  Charges:  $Gait Training: 8-22 mins                    G Codes:       [name] Shock, PT, DPT Pager #: [phone] Office #: [phone]    Maxemiliano Riel M Keylee Shrestha 11/02/2017, 11:56 AM

## 2017-11-02 NOTE — Progress Notes (Signed)
Inpatient Diabetes Program Recommendations  AACE/ADA: New Consensus Statement on Inpatient Glycemic Control (2015)  Target Ranges:  Prepandial:   less than 140 mg/dL      Peak postprandial:   less than 180 mg/dL (1-2 hours)      Critically ill patients:  140 - 180 mg/dL   Lab Results  Component Value Date   GLUCAP 132 (H) 11/02/2017   HGBA1C 6.9 (H) 10/25/2017    Review of Glycemic Control Results for [name], [name] (MRN [id number]) as of 11/02/2017 10:59  Ref. Range 11/01/2017 07:04 11/01/2017 11:13 11/01/2017 16:22 11/02/2017 06:43  Glucose-Capillary Latest Ref Range: 65 - 99 mg/dL 157 (H) 262 (H) 035 (H) 132 (H)   Diabetes history: Type 2 DM Outpatient Diabetes medications: Glipizide 5 mg QAM Current orders for Inpatient glycemic control: Novolog 0-15 units TID, Novolog 0-5 units QHS  Inpatient Diabetes Program Recommendations:    Could consider adding Levemir 8 units QD (70.9 kg x .12).  Thanks, Lujean Rave, MSN, RNC-OB Diabetes Coordinator [phone] (8a-5p)

## 2017-11-02 NOTE — Progress Notes (Signed)
Nutrition Follow-up  DOCUMENTATION CODES:   Not applicable  INTERVENTION:   Magic Cup TID on meal trays   NUTRITION DIAGNOSIS:   Inadequate oral intake related to acute illness as evidenced by NPO status.  Being addressed as diet advanced post extubation, good appetite   GOAL:   Patient will meet greater than or equal to 90% of their needs  MONITOR:   Vent status, TF tolerance, Labs, Weight trends  REASON FOR ASSESSMENT:   Consult, Ventilator Enteral/tube feeding initiation and management  ASSESSMENT:   72 yo male admitted with acute respiratory failure, acute encephalopathy, acute advanced heart failure. Pt with hx of 2L O2 dependent COPD, CHF, possible dementia, EtOH abuse, dysphagia, CAD, PAD, MV replacement, CKD III  5/23 Extubated 5/26 MBSS; Diet advanced to dysphagia III, Honey Thick  Recorded po intake 90-100%; pt with very good appetite  Current weight 156 pounds; net negative 10 L since admission. Admit weight 166 pound s(75.5 kg), lowest wt 142 pounds (64.8 kg)  Labs: CBGs 132-275 Meds: MVI, lactulose, thiamine   Diet Order:   Diet Order           DIET DYS 3 Room service appropriate? Yes; Fluid consistency: Honey Thick  Diet effective now          EDUCATION NEEDS:   Not appropriate for education at this time  Skin:  Skin Assessment: Reviewed RN Assessment  Last BM:  5/19  Height:   Ht Readings from Last 1 Encounters:  10/21/17 5\' 4"  (1.626 m)    Weight:   Wt Readings from Last 1 Encounters:  11/02/17 156 lb 4.9 oz (70.9 kg)    Ideal Body Weight:     BMI:  Body mass index is 26.83 kg/m.  Estimated Nutritional Needs:   Kcal:  1775-2130 kcals   Protein:  89-107 g   Fluid:  >/= 1.8 L   Romelle Starcher MS, RD, LDN, CNSC [phone] Pager  [phone] Weekend/On-Call Pager

## 2017-11-03 ENCOUNTER — Ambulatory Visit (HOSPITAL_COMMUNITY): Payer: Medicare Other

## 2017-11-03 LAB — BASIC METABOLIC PANEL
ANION GAP: 6 (ref 5–15)
BUN: 23 mg/dL — ABNORMAL HIGH (ref 6–20)
CALCIUM: 9.5 mg/dL (ref 8.9–10.3)
CO2: 32 mmol/L (ref 22–32)
Chloride: 104 mmol/L (ref 101–111)
Creatinine, Ser: 1 mg/dL (ref 0.61–1.24)
Glucose, Bld: 149 mg/dL — ABNORMAL HIGH (ref 65–99)
Potassium: 4.9 mmol/L (ref 3.5–5.1)
Sodium: 142 mmol/L (ref 135–145)

## 2017-11-03 LAB — GLUCOSE, CAPILLARY
GLUCOSE-CAPILLARY: 181 mg/dL — AB (ref 65–99)
GLUCOSE-CAPILLARY: 234 mg/dL — AB (ref 65–99)

## 2017-11-03 LAB — DIGOXIN LEVEL: DIGOXIN LVL: 0.7 ng/mL — AB (ref 0.8–2.0)

## 2017-11-03 MED ORDER — ARFORMOTEROL TARTRATE 15 MCG/2ML IN NEBU: 15.0000 ug | INHALATION_SOLUTION | Freq: Two times a day (BID) | RESPIRATORY_TRACT | 0 refills | Status: DC

## 2017-11-03 MED ORDER — IPRATROPIUM-ALBUTEROL 0.5-2.5 (3) MG/3ML IN SOLN: 3.0000 mL | RESPIRATORY_TRACT | 0 refills | Status: DC | PRN

## 2017-11-03 MED ORDER — LACTULOSE 10 GM/15ML PO SOLN: 30.0000 g | Freq: Two times a day (BID) | ORAL | 0 refills | Status: DC

## 2017-11-03 MED ORDER — DIGOXIN 125 MCG PO TABS: 0.1250 mg | ORAL_TABLET | Freq: Every day | ORAL | 6 refills | Status: DC

## 2017-11-03 MED ORDER — BUDESONIDE 0.5 MG/2ML IN SUSP: 0.5000 mg | Freq: Two times a day (BID) | RESPIRATORY_TRACT | 0 refills | Status: DC

## 2017-11-03 MED ORDER — LOSARTAN POTASSIUM 25 MG PO TABS: 12.5000 mg | ORAL_TABLET | Freq: Every day | ORAL | 6 refills | Status: DC

## 2017-11-03 MED ORDER — STARCH (THICKENING) PO POWD: ORAL | 0 refills | Status: DC

## 2017-11-03 MED ORDER — SENNOSIDES-DOCUSATE SODIUM 8.6-50 MG PO TABS: 1.0000 | ORAL_TABLET | Freq: Every day | ORAL | 0 refills | Status: DC

## 2017-11-03 MED ORDER — CARVEDILOL 6.25 MG PO TABS
6.2500 mg | ORAL_TABLET | Freq: Two times a day (BID) | ORAL | Status: DC
  Administered 2017-11-03: 6.25 mg via ORAL
  Filled 2017-11-03: qty 1

## 2017-11-03 MED ORDER — TORSEMIDE 20 MG PO TABS
40.0000 mg | ORAL_TABLET | Freq: Every day | ORAL | Status: DC
  Administered 2017-11-03: 40 mg via ORAL
  Filled 2017-11-03: qty 2

## 2017-11-03 MED ORDER — TORSEMIDE 20 MG PO TABS: 40.0000 mg | ORAL_TABLET | Freq: Every day | ORAL | 6 refills | Status: DC

## 2017-11-03 NOTE — Progress Notes (Signed)
Patient ID: [name], male   DOB: 1946/03/01, 72 y.o.   MRN: [id number]     Advanced Heart Failure Rounding Note  PCP-Cardiologist: No primary care provider on file.   Subjective:    ABG 5/19 => 7.29/100/72.  Bipap started, repeat ABG 7.43/73/83.    Decompensated 10/26/17 with AMS although PCO2 was actually a bit lower than the prior day. Intubated for airway protection and moved to ICU. NH3 58.  EEG with diffuse cerebral dysfunction. Lactulose started.   Extubated 10/28/17.    BMS 10/31/17 with moderate oropharyngeal aspiration. Dysphagia 3 diet.   Creatinine 1.0. K 4.9.   No dyspnea today. Weight has trended down.  Has been more alert overall.    EEG 10/26/17 1. Moderate diffuse background slowing with occasional triphasic waves 2. Occasional epileptiform discharges over the left midtemporal and left occipital regions ? Metabolic encephalopathy.  Objective:   Weight Range: 156 lb 15.5 oz (71.2 kg) Body mass index is 26.94 kg/m.   Vital Signs:   Temp:  [97.7 F (36.5 C)-98.8 F (37.1 C)] 98.8 F (37.1 C) (05/29 0404) Pulse Rate:  [59-67] 61 (05/29 0404) Resp:  [18-27] 24 (05/29 0404) BP: (100-151)/(51-71) 151/67 (05/29 0404) SpO2:  [95 %-98 %] 97 % (05/29 0404) Weight:  [156 lb 15.5 oz (71.2 kg)-158 lb 4.6 oz (71.8 kg)] 156 lb 15.5 oz (71.2 kg) (05/29 0500) Last BM Date: 11/02/17  Weight change: Filed Weights   11/02/17 0600 11/02/17 2132 11/03/17 0500  Weight: 156 lb 4.9 oz (70.9 kg) 158 lb 4.6 oz (71.8 kg) 156 lb 15.5 oz (71.2 kg)   Intake/Output:   Intake/Output Summary (Last 24 hours) at 11/03/2017 0820 Last data filed at 11/03/2017 0406 Gross per 24 hour  Intake 490 ml  Output 1000 ml  Net -510 ml    Physical Exam    General: NAD Neck: No JVD, no thyromegaly or thyroid nodule.  Lungs: Distant breath sounds.  CV: Nondisplaced PMI.  Heart regular S1/S2, no S3/S4, no murmur.  No peripheral edema.   Abdomen: Soft, nontender, no hepatosplenomegaly, no  distention.  Skin: Intact without lesions or rashes.  Neurologic: Alert and oriented x 3.  Psych: Normal affect. Extremities: No clubbing or cyanosis.  HEENT: Normal.   Telemetry   Afib with BiV pacing, personally reviewed.   Labs    CBC No results for input(s): WBC, NEUTROABS, HGB, HCT, MCV, PLT in the last 72 hours. Basic Metabolic Panel Recent Labs    95/18/84 0329 11/03/17 0412  NA 144 142  K 4.8 4.9  CL 108 104  CO2 30 32  GLUCOSE 157* 149*  BUN 21* 23*  CREATININE 1.00 1.00  CALCIUM 9.4 9.5   Liver Function Tests No results for input(s): AST, ALT, ALKPHOS, BILITOT, PROT, ALBUMIN in the last 72 hours. No results for input(s): LIPASE, AMYLASE in the last 72 hours. Cardiac Enzymes No results for input(s): CKTOTAL, CKMB, CKMBINDEX, TROPONINI in the last 72 hours.  BNP: BNP (last 3 results) Recent Labs    02/01/17 1054 06/06/17 1037 10/21/17 1842  BNP 102.6* 486.4* 456.6*    ProBNP (last 3 results) Recent Labs    05/25/17 1152  PROBNP 1,108*   D-Dimer No results for input(s): DDIMER in the last 72 hours. Hemoglobin A1C No results for input(s): HGBA1C in the last 72 hours. Fasting Lipid Panel No results for input(s): CHOL, HDL, LDLCALC, TRIG, CHOLHDL, LDLDIRECT in the last 72 hours. Thyroid Function Tests No results for input(s): TSH, T4TOTAL, T3FREE,  THYROIDAB in the last 72 hours.  Invalid input(s): FREET3  Other results:  Imaging   No results found.  Medications:     Scheduled Medications: . apixaban  5 mg Oral BID  . arformoterol  15 mcg Nebulization BID  . atorvastatin  40 mg Oral QHS  . budesonide (PULMICORT) nebulizer solution  0.5 mg Nebulization BID  . carvedilol  6.25 mg Oral BID WC  . digoxin  0.125 mg Oral Daily  . insulin aspart  0-15 Units Subcutaneous TID WC  . insulin aspart  0-5 Units Subcutaneous QHS  . lactulose  30 g Oral BID  . losartan  12.5 mg Oral Daily  . multivitamin with minerals  1 tablet Oral Daily  .  senna-docusate  1 tablet Oral Daily  . sodium chloride flush  10-40 mL Intracatheter Q12H  . thiamine  100 mg Oral Daily  . torsemide  20 mg Oral Daily    Infusions:   PRN Medications: food thickener, ipratropium-albuterol, sennosides, sodium chloride flush  Assessment/Plan   1. Acute on chronic systolic CHF: ECHO 05/23/15 EF 20-25% Mixed ischemic/nonischemic (ETOH) cardiomyopathy. S/p Medtronic CRT-D. Echo this admission with EF 30-35%.  Volume status looks ok on exam, weight down > 10 lbs in hospital.  - Will send home on torsemide 40 mg daily (was on 60 mg daily prior to admission but has been on the dry side the last few days).  - Continue digoxin 0.125 daily, level 0.7 today.  - Can increase Coreg to 6.25 mg bid.  - Continue low dose losartan. Will not increase losartan or add spironolactone for now with K upper normal.  2. Acute on chronic hypercarbic resp failure: Suspect OSA but has not had sleep study.  Followed by pulmonary in the hospital, have recommended home Bipap and sleep study.  - Will need to arrange for home Bipap.  - Will need sleep study as outpatient.  - Will need followup with pulmonary.   3. Atrial fibrillation:Chronic,s/p AV nodal ablation.Now on Eliquis.  4. PAD:No claudication symptoms: Follows at [facility].  - No change to current plan.   5. CAD:  Severe LM stenosis found 1/19 =>not a CABG candidate due to severe COPD and not PCI candidate due to inability to use Impella due to severe PAD (have reviewed extensively with interventional cardiology).  No chest pain.    - No ASA with stable CAD and need for Eliquis.  - Continue statin.  6. Mitral valve replacement:Bioprosthetic valve currently, originally Bjork-Shiley. Stable on echo this admission.  7. ETOH abuse: Says he has quit drinking. - No change to current plan.   8. Smoking/COPD:On home oxygen, severe.  - Still smoking some. Needs complete cessation.  - Continue home oxygen during the day, will  need Bipap at night.  9. AKI on CKD Stage 3: Resolved, creatinine 1.0.  10. Neuro: Baseline bipolar disorder and dementia.  Family says he "sleeps all the time" at home.  Suspect hypercarbia in setting of severe COPD and OSA is part of the cause, also with elevated NH3 and concern for hepatic encephalopathy.  11. Metabolic Encephalopathy: PaCO2 elevated when intubated but actually lower than prior days, cannot explain all AMS by hypercarbia. NH3 mildly elevated. EEG 10/26/17 1. Moderate diffuse background slowing with occasional triphasic waves, 2. Occasional epileptiform discharges over the left midtemporal and left occipital regions.  We have been treating for hepatic encephalopathy with lactulose. Abdominal US, look for cirrhosis => showed increased hepatic echogenicity. He is alert today.  -  Continue Lactulose 30 g bid for home.   - Will need to use Bipap qhs.   Plan for home today.  He will need followup in CHF clinic in 10 days and with pulmonary.  Will need to set him up for Bipap going home from the hospital.  Continue to use oxygen during the day.  Cardiac meds for discharge: apixaban 5 mg bid, torsemide 40 mg daily, atorvastatin 40 mg daily, Coreg 6.25 mg bid, losartan 12.5 mg daily, digoxin 0.125 daily, lactulose 30 g bid.   Length of Stay: 30  Marca Ancona, MD  11/03/2017, 8:20 AM

## 2017-11-03 NOTE — Progress Notes (Signed)
    Durable Medical Equipment  (From admission, onward)        Start     Ordered   11/03/17 1057  For home use only DME Bipap  Once    Comments:  IPAP 14, EPAP 7 bipap until insurance authorizes NIV  Question Answer Comment  Bleed in oxygen (LPM) 2.5   Inspiratory pressure OTHER SEE COMMENTS   Expiratory pressure OTHER SEE COMMENTS      11/03/17 1058   11/03/17 1022  For home use only DME Nebulizer machine  Once    Question:  Patient needs a nebulizer to treat with the following condition  Answer:  COPD (chronic obstructive pulmonary disease) (HCC)   11/03/17 1049   11/03/17 1006  For home use only DME Hospital bed  Once    Question Answer Comment  Patient has (list medical condition): systolic heart failure, hypercarbic respiratory failure   The above medical condition requires: Patient requires the ability to reposition frequently   Head must be elevated greater than: 30 degrees   Bed type Semi-electric      11/03/17 1006   11/03/17 0945  Heart failure home health orders  (Heart failure home health orders / Face to face)  Once    Comments:  Heart Failure Follow-up Care:  Verify follow-up appointments per Patient Discharge Instructions. Confirm transportation arranged. Reconcile home medications with discharge medication list. Remove discontinued medications from use. Assist patient/caregiver to manage medications using pill box. Reinforce low sodium food selection Assessments: Vital signs and oxygen saturation at each visit. Assess home environment for safety concerns, caregiver support and availability of low-sodium foods. Consult Child psychotherapist, PT/OT, Dietitian, and CNA based on assessments. Perform comprehensive cardiopulmonary assessment. Notify MD for any change in condition or weight gain of 3 pounds in one day or 5 pounds in one week with symptoms. Daily Weights and Symptom Monitoring: Ensure patient has access to scales. Teach patient/caregiver to weigh daily before  breakfast and after voiding using same scale and record.    Teach patient/caregiver to track weight and symptoms and when to notify Provider. Activity: Develop individualized activity plan with patient/caregiver.   Question Answer Comment  Heart Failure Follow-up Care [facility] at [phone]   Obtain the following labs Other see comments   Lab frequency Other see comments   Fax lab results to [facility] at [phone]   Diet Low Sodium Heart Healthy   Fluid restrictions: 2000 mL Fluid   Skilled Nurse to notify MD of weight trends weekly for first 2 weeks. May fax or call: [facility] at [phone] (fax) or [phone]   Consult: Case manager   Consult: Social work   Initiate Heart Failure Clinic Diuretic Protocol to be used by [facility] only ( to be ordered by Heart Failure Team Providers Only) Yes      11/03/17 0946   11/03/17 0938  For home use only DME Walker rolling  Once    Comments:  5 inch wheels  Question:  Patient needs a walker to treat with the following condition  Answer:  Weakness   11/03/17 0940

## 2017-11-03 NOTE — Discharge Summary (Signed)
[facility] Discharge Note  Discharge Summary   Patient ID: [name] MRN: [id number], DOB/AGE: [id number] 70 y.o. Admit date: 10/21/2017 D/C date:     11/03/2017    Primary Discharge Diagnoses:  1. A/C systolic HF - Had Medtronic CRT-D 2. A/C hypercarbic respiratory failure - Intubated 5/21-5/23 with AMS for airway protection 3. Chronic atrial fibrillation  - On Eliquis - S/p AV nodal ablation 4. PAD 5. CAD 6. Mitral valve replacement 7. ETOH abuse 8. Smoking/COPD - On home O2 during the day 9. AKI on CKD stage 3 10. Neuro 11. Metabolic encephalopathy  Hospital Course: [name] is a 72 y.o. 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   Admitted 10/21/17 with A/C systolic HF and A/C respiratory failure. He required BiPAP and was intubated 5/21-5/23 for AMS to protect his airway. EEG showed diffuse cerebral dysfunction. Mental status improved with initiation of lactulose. Diuresed 10 lbs with IV lasix and diamox, then transitioned to torsemide 40 mg daily.   1.Acute on chronic systolic CHF: Mixed ischemic/nonischemic (ETOH) cardiomyopathy. S/p Medtronic CRT-D. Echo 10/23/17: EF 30-35%.  - Diuresed 10 lbs with IV lasix and diamox. Transitioned to torsemide 40 mg daily -Continue digoxin 0.125 daily, level 0.7 11/03/17 - Continue coreg to 6.25 mg bid.  - Continue losartan 12.5 mg daily - Spironolactone was not started due to higher K (4.9 on day of discharge) 2. Acute on chronic hypercarbic resp failure: Suspect OSA but has not had sleep study.  Followed by pulmonary in the hospital, have recommended home Bipap and sleep study.  - Case manager has arranged for BIPAP to be delivered to room today. - Spoke  with pulmonary - Will keep BiPAP settings that he has used in the hospital. Continue nebulizer treatments outpatient.  - Will need sleep study as outpatient.  - He has follow up with pulmonary on 11/11/17.  3. Atrial fibrillation:Chronic,s/p AV nodal ablation. - Eliquis continued. No s/s bleeding.  4. PAD:No claudication symptoms: Stable. Follows with [facility].  5. CAD:  SevereLM stenosis found 1/19=>not a CABG candidate due to severe COPD and not PCI candidate due to inability to use Impelladue to severe PAD(have reviewed extensively with interventional cardiology).  No CP during admission.   - No ASA with stable CAD and need for Eliquis.  - Continue statin.  6. Mitral valve replacement:Bioprosthetic valve currently, originally Bjork-Shiley. Stable on echo this admission.  7. ETOH abuse: Says he has quit drinking. - Encouraged continued cessation.  8. Smoking/COPD: - Still smoking. Encouraged complete cessation.  - Continue home oxygen 2.5 L during the day, will need Bipap at night.   9. AKI on CKD Stage 3: Creatine peaked at 1.86. Daily BMET monitored. Stable at 1.0 on day of discharge.  10. Neuro: Baseline bipolar disorder and dementia. Suspect hypercarbia in setting of severe COPD and OSA is part of the cause, also with elevated NH3 and concern for hepatic encephalopathy.  - Started on lactulose with improvement in AMS.   - Continue lactulose 30 g BID - ST consulted and recommends Dysphagia 3 Honey Thick diet. Thickner ordered at discharge.  11. Metabolic Encephalopathy: PaCO2 elevated when intubated but actually lower than prior days, cannot explain all AMS by hypercarbia. NH3 mildly elevated. EEG 10/26/17 1. Moderatediffuse  backgroundslowingwith occasional triphasic waves, 2. Occasional epileptiform discharges over the left midtemporal and left occipital regions.  We have been treating for hepatic encephalopathy with lactulose. Abdominal US, look for cirrhosis => showed increased  hepatic echogenicity. AMS improved by day of discharge.  - Continue Lactulose 30 g bid for home.   - Will need to use Bipap qhs.  12. Deconditioning - HH PT, RN, ST, and aide. Family requests [facility].  - Rolling walker, hospital bed, BiPAP, and nebulizer machine all ordered for home.  Patient will be followed closely in the HF clinic, appointment as below.  Discharge Weight Range: 156 lbs Discharge Vitals: Blood pressure 133/66, pulse (!) 57, temperature 98.1 F (36.7 C), temperature source Oral, resp. rate (!) 25, height 5\' 4"  (1.626 m), weight 156 lb 15.5 oz (71.2 kg), SpO2 96 %.  Labs: Lab Results  Component Value Date   WBC 11.7 (H) 10/29/2017   HGB 13.2 10/29/2017   HCT 44.1 10/29/2017   MCV 89.3 10/29/2017   PLT 183 10/29/2017    Recent Labs  Lab 10/29/17 0426  11/03/17 0412  NA 143   < > 142  K 3.7   < > 4.9  CL 93*   < > 104  CO2 38*   < > 32  BUN 74*   < > 23*  CREATININE 1.86*   < > 1.00  CALCIUM 9.3   < > 9.5  PROT 7.5  --   --   BILITOT 1.1  --   --   ALKPHOS 96  --   --   ALT 16*  --   --   AST 26  --   --   GLUCOSE 130*   < > 149*   < > = values in this interval not displayed.   Lab Results  Component Value Date   CHOL 135 06/22/2017   HDL 39 (L) 06/22/2017   LDLCALC 59 06/22/2017   TRIG 187 (H) 06/22/2017   BNP (last 3 results) Recent Labs    02/01/17 1054 06/06/17 1037 10/21/17 1842  BNP 102.6* 486.4* 456.6*    ProBNP (last 3 results) Recent Labs    05/25/17 1152  PROBNP 1,108*     Diagnostic Studies/Procedures   Echo 10/23/17: - Procedure narrative: Transthoracic echocardiography. Image   quality was adequate. The study was technically difficult, as a   result of poor acoustic windows. - Left ventricle: The cavity size was mildly dilated. Wall   thickness was increased in a pattern of mild LVH. Systolic   function was moderately to severely reduced. The estimated   ejection fraction was in the range of 30% to 35%.  There is   akinesis of the inferolateral and inferior myocardium. Doppler   parameters are consistent with high ventricular filling pressure. - Mitral valve: A bioprosthesis was present. - Left atrium: The atrium was severely dilated. - Right ventricle: The cavity size was mildly dilated. Systolic   function was moderately reduced. - Pulmonary arteries: Systolic pressure was moderately increased.   PA peak pressure: 65 mm Hg (S).  EEG 10/26/17 1. Moderatediffuse backgroundslowingwith occasional triphasic waves 2. Occasional epileptiform discharges over the left midtemporal and left occipital regions ? Metabolic encephalopathy.   Discharge Medications   Allergies as of 11/03/2017      Reactions   Warfarin Other (See Comments)   Non compliance and ETOH abuse      Medication List    STOP taking these medications   spironolactone 25  MG tablet Commonly known as:  ALDACTONE     TAKE these medications   apixaban 5 MG Tabs tablet Commonly known as:  ELIQUIS Take 1 tablet (5 mg total) by mouth 2 (two) times daily.   arformoterol 15 MCG/2ML Nebu Commonly known as:  BROVANA Take 2 mLs (15 mcg total) by nebulization 2 (two) times daily for 15 days.   arformoterol 15 MCG/2ML Nebu Commonly known as:  BROVANA Take 2 mLs (15 mcg total) by nebulization 2 (two) times daily.   atorvastatin 40 MG tablet Commonly known as:  LIPITOR Take 1 tablet (40 mg total) by mouth daily. What changed:  when to take this   budesonide 0.5 MG/2ML nebulizer solution Commonly known as:  PULMICORT Take 2 mLs (0.5 mg total) by nebulization 2 (two) times daily.   carvedilol 6.25 MG tablet Commonly known as:  COREG Take 1 tablet (6.25 mg total) by mouth 2 (two) times daily with a meal. What changed:  when to take this   digoxin 0.125 MG tablet Commonly known as:  LANOXIN Take 1 tablet (0.125 mg total) by mouth daily.   food thickener Powd Commonly known as:  THICK IT Honey- thick liquids.  Follow instructions on container.   glipiZIDE 5 MG tablet Commonly known as:  GLUCOTROL Take 5 mg daily before breakfast by mouth.   ipratropium-albuterol 0.5-2.5 (3) MG/3ML Soln Commonly known as:  DUONEB Take 3 mLs by nebulization every 4 (four) hours as needed.   lactulose 10 GM/15ML solution Commonly known as:  CHRONULAC Take 45 mLs (30 g total) by mouth 2 (two) times daily.   loratadine 10 MG tablet Commonly known as:  CLARITIN Take 1 tablet (10 mg total) by mouth daily.   losartan 25 MG tablet Commonly known as:  COZAAR Take 0.5 tablets (12.5 mg total) by mouth daily. What changed:  how much to take   multivitamin with minerals Tabs tablet Take 1 tablet by mouth daily.   nitroGLYCERIN 0.4 MG SL tablet Commonly known as:  NITROSTAT Place 1 tablet (0.4 mg total) under the tongue every 5 (five) minutes as needed for chest pain.   OXYGEN Inhale 2.5 L into the lungs continuous.   senna-docusate 8.6-50 MG tablet Commonly known as:  Senokot-S Take 1 tablet by mouth daily. Hold for diarrhea.   thiamine 100 MG tablet Take 1 tablet (100 mg total) by mouth daily.   torsemide 20 MG tablet Commonly known as:  DEMADEX Take 2 tablets (40 mg total) by mouth daily. What changed:    how much to take  additional instructions            Durable Medical Equipment  (From admission, onward)        Start     Ordered   11/03/17 1132  Heart failure home health orders  (Heart failure home health orders / Face to face)  Once    Comments:  Heart Failure Follow-up Care:  Verify follow-up appointments per Patient Discharge Instructions. Confirm transportation arranged. Reconcile home medications with discharge medication list. Remove discontinued medications from use. Assist patient/caregiver to manage medications using pill box. Reinforce low sodium food selection Assessments: Vital signs and oxygen saturation at each visit. Assess home environment for safety concerns,  caregiver support and availability of low-sodium foods. Consult Child psychotherapist, PT/OT, Dietitian, and CNA based on assessments. Perform comprehensive cardiopulmonary assessment. Notify MD for any change in condition or weight gain of 3 pounds in one day or 5 pounds in one week with  symptoms. Daily Weights and Symptom Monitoring: Ensure patient has access to scales. Teach patient/caregiver to weigh daily before breakfast and after voiding using same scale and record.    Teach patient/caregiver to track weight and symptoms and when to notify Provider. Activity: Develop individualized activity plan with patient/caregiver.  Please consult Speech Therapy as well. He is on Dys-3 Diet with honey thick liquids.  Question Answer Comment  Heart Failure Follow-up Care [facility] at [phone]   Obtain the following labs Other see comments   Lab frequency Other see comments   Fax lab results to [facility] at [phone]   Diet Low Sodium Heart Healthy   Fluid restrictions: 2000 mL Fluid   Skilled Nurse to notify MD of weight trends weekly for first 2 weeks. May fax or call: [facility] at [phone] (fax) or [phone]   Consult: Case manager   Consult: Social work   Initiate Heart Failure Clinic Diuretic Protocol to be used by [facility] only ( to be ordered by Heart Failure Team Providers Only) Yes      11/03/17 1132   11/03/17 1057  For home use only DME Bipap  Once    Comments:  IPAP 14, EPAP 7 bipap until insurance authorizes NIV  Question Answer Comment  Bleed in oxygen (LPM) 2.5   Inspiratory pressure OTHER SEE COMMENTS   Expiratory pressure OTHER SEE COMMENTS      11/03/17 1058   11/03/17 1022  For home use only DME Nebulizer machine  Once    Question:  Patient needs a nebulizer to treat with the following condition  Answer:  COPD (chronic obstructive pulmonary disease) (HCC)   11/03/17 1049   11/03/17 1006  For home use only DME Hospital bed  Once     Question Answer Comment  Patient has (list medical condition): systolic heart failure, hypercarbic respiratory failure   The above medical condition requires: Patient requires the ability to reposition frequently   Head must be elevated greater than: 30 degrees   Bed type Semi-electric      11/03/17 1006   11/03/17 0938  For home use only DME Walker rolling  Once    Comments:  5 inch wheels  Question:  Patient needs a walker to treat with the following condition  Answer:  Weakness   11/03/17 0940      Disposition   The patient will be discharged in stable condition to home. Discharge Instructions    (HEART FAILURE PATIENTS) Call MD:  Anytime you have any of the following symptoms: 1) 3 pound weight gain in 24 hours or 5 pounds in 1 week 2) shortness of breath, with or without a dry hacking cough 3) swelling in the hands, feet or stomach 4) if you have to sleep on extra pillows at night in order to breathe.   Complete by:  As directed    Diet - low sodium heart healthy   Complete by:  As directed    All liquids should be honey-thick consistency   Heart Failure patients record your daily weight using the same scale at the same time of day   Complete by:  As directed    Increase activity slowly   Complete by:  As directed    STOP any activity that causes chest pain, shortness of breath, dizziness, sweating, or exessive weakness   Complete by:  As directed      Follow-up Information    Bevelyn Ngo, NP Follow  up on 11/11/2017.   Specialty:  Pulmonary Disease Why:  Your appointment is at 9:00AM. Contact information: [address] [place] [address] [phone]        [name], MD Follow up on 11/12/2017.   Specialty:  Cardiology Why:  Heart Failure Follow up-3:30-Parking in ER lot (enter under blue "Specialty Clinics" awning) or under [facility] (Construction entrance with big white signs, garage code: 1300, elevator 1st floor). Take all am  meds, bring all med bottles. Contact information: [address] [place] [address] [phone]        [facility] - Dme Follow up.   Why:  BiPAP to be delivered to room prior to DC. Hospital bed will delivered to house tomorrow. Portable tank for transportation home wil be delivered to room today Contact information: [address] [place] [address] [phone]        [facility] .   Specialty:  Home Health Services Contact information: [address] [place] [address] [phone]        [facility] Follow up.   Why:  Rosalyn Gess will be delivered to your house by [facility]. You will at first use the 15 day voucher to have a 15 day supply to use until it comes in the mail. Contact information: [address] [place] [address] [phone]             Duration of Discharge Encounter: Greater than 35 minutes   Signed, Alford Highland, NP 11/03/2017, 2:05 PM

## 2017-11-03 NOTE — Progress Notes (Signed)
Provided with 15 day voucher and 15 day Rx for Brovana., Faxed 30 day Rx to [facility] at [phone]. Explained they will receive Brovana through [facility] and it will be delivered to the house

## 2017-11-05 ENCOUNTER — Ambulatory Visit (HOSPITAL_COMMUNITY): Payer: Medicare Other

## 2017-11-08 ENCOUNTER — Ambulatory Visit (HOSPITAL_COMMUNITY): Payer: Medicare Other

## 2017-11-09 ENCOUNTER — Encounter: Payer: Self-pay | Admitting: Neurology

## 2017-11-09 LAB — HEPATIC FUNCTION PANEL
ALT: 49 — AB (ref 10–40)
AST: 37 (ref 14–40)
Alkaline Phosphatase: 119 (ref 25–125)
Bilirubin, Total: 0.5

## 2017-11-10 ENCOUNTER — Ambulatory Visit (HOSPITAL_COMMUNITY): Payer: Medicare Other

## 2017-11-11 ENCOUNTER — Ambulatory Visit (INDEPENDENT_AMBULATORY_CARE_PROVIDER_SITE_OTHER): Payer: Medicare Other | Admitting: Acute Care

## 2017-11-11 ENCOUNTER — Encounter: Payer: Self-pay | Admitting: Acute Care

## 2017-11-11 ENCOUNTER — Ambulatory Visit (INDEPENDENT_AMBULATORY_CARE_PROVIDER_SITE_OTHER)
Admission: RE | Admit: 2017-11-11 | Discharge: 2017-11-11 | Disposition: A | Discharge status: Home / Self Care | Payer: Medicare Other | Source: Ambulatory Visit | Attending: Acute Care | Admitting: Acute Care

## 2017-11-11 VITALS — BP 108/60 | HR 77 | Ht 67.0 in | Wt 156.2 lb

## 2017-11-11 DIAGNOSIS — J9612 Chronic respiratory failure with hypercapnia: Secondary | ICD-10-CM

## 2017-11-11 DIAGNOSIS — Z720 Tobacco use: Secondary | ICD-10-CM

## 2017-11-11 DIAGNOSIS — F0390 Unspecified dementia without behavioral disturbance: Secondary | ICD-10-CM | POA: Diagnosis not present

## 2017-11-11 DIAGNOSIS — J449 Chronic obstructive pulmonary disease, unspecified: Secondary | ICD-10-CM | POA: Diagnosis not present

## 2017-11-11 DIAGNOSIS — R0689 Other abnormalities of breathing: Secondary | ICD-10-CM

## 2017-11-11 DIAGNOSIS — J9611 Chronic respiratory failure with hypoxia: Secondary | ICD-10-CM

## 2017-11-11 DIAGNOSIS — I5043 Acute on chronic combined systolic (congestive) and diastolic (congestive) heart failure: Secondary | ICD-10-CM | POA: Diagnosis not present

## 2017-11-11 NOTE — Patient Instructions (Addendum)
It is good to see you today. CXR today. We will call you with results Ambulatory DME for concentrator filter requested./ and all supplies. DME order for POC ( [employer]) We will walk you today on the portable oxygen Wear your oxygen at 2 L [place] Continue supplemental O2 as needed to maintain SpO2 > 88%.- 92% You need to wear your oxygen with exertion. Continue nightly BiPAP.  He will need this long-term as outpatient. We will schedule a sleep lab sleep study. When you are better we will schedule PFT's Continue Brovana, Pulmicort, twice daily DuoNeb as needed for breakthrough shortness of breath. Tobacco cessation counseling.>> Family is monitoring Follow up with heart failure clinic 6/7/ 2019 at  3:30 as scheduled. Weights every day Low salt diet ( 1500 mg daily) Heart Health diet Ambulatory referral for Cardiac/Pulmonay Rehab ( Dr. [name]) Follow up with Dr. [name] in 3 months with PFT's  Please contact office for sooner follow up if symptoms do not improve or worsen or seek emergency care

## 2017-11-11 NOTE — Assessment & Plan Note (Signed)
Currently stable Swallow study >> Dysphasia 3 Diet  Plan: CXR today. We will call you with results Ambulatory DME for concentrator filter requested./ and all supplies. DME order for POC ( [employer]) We will walk you today on the portable oxygen Wear your oxygen at 3 L [place] pulsed and 2 L [place] continuous Continue supplemental O2 as needed to maintain SpO2 > 88%.- 92% You need to wear your oxygen with exertion. When you are better we will schedule PFT's Continue Brovana, Pulmicort, twice daily DuoNeb as needed for breakthrough shortness of breath. Referral to Pulmonary rehab Follow up with Dr. [name] in 3 months with PFT's  Please contact office for sooner follow up if symptoms do not improve or worsen or seek emergency care

## 2017-11-11 NOTE — Assessment & Plan Note (Addendum)
Hospitalized 10/2017 requiring intubation BiPap initiated at hospitalization Plan: Continue oxygen at 2 L continuous flow and 3 L pulsed flow Saturation goals are 88-92% Continue nightly BiPAP.  He will need this long-term as outpatient. We will schedule a sleep lab sleep study. When you are better we will schedule PFT's Ambulatory referral for Cardiac/Pulmonay Rehab ( Dr. [name]) Follow up with Dr. [name] in 3 months with PFT's  Please contact office for sooner follow up if symptoms do not improve or worsen or seek emergency care

## 2017-11-11 NOTE — Assessment & Plan Note (Addendum)
No smoking since discharge with careful family monitoring ( Pt. Will smoke if the opportunity arises) Plan: Work on quitting smoking I have spent 3 minutes counseling patient on smoking cessation this visit. This was reinforced with family present

## 2017-11-11 NOTE — Assessment & Plan Note (Signed)
Compliant with diuretic and daily weights Weight stable since discharge at 156 pounds Plan: Continue diuretic per cardiology Follow up with heart failure clinic 6/7/ 2019 at  3:30 as scheduled. Weights every day Low salt diet ( 1500 mg daily) Heart Health diet Ambulatory referral for Cardiac/Pulmonay Rehab ( Dr. [name]) Follow up with Dr. [name] in 3 months with PFT's  Please contact office for sooner follow up if symptoms do not improve or worsen or seek emergency care

## 2017-11-11 NOTE — Assessment & Plan Note (Signed)
CT head done as inpatient showed no new acute processes Plan: Monitor

## 2017-11-11 NOTE — Progress Notes (Signed)
History of Present Illness [name] is a 72 y.o. male current every day smoker with COPD on home oxygen, suspected OSA/OHS, chronic systolic heart failure, atrial fib,ETOH abuse. CKD stage 3. He is followed by Dr. [name]   11/11/2017 Hospital Follow Up: Pt. Presents today for hospital follow up. He was admitted to the hospital with Acute on Chronic HF requiring intubation from 5/21-5/23. He had many other compounding medical factors that drove his care. See below for a detailed hospital Course:   Admit date: 10/21/2017 D/C date:     11/03/2017   Primary Discharge Diagnoses:  1. A/C systolic HF - Had Medtronic CRT-D 2. A/C hypercarbic respiratory failure - Intubated 5/21-5/23 with AMS for airway protection 3. Chronic atrial fibrillation  - On Eliquis - S/p AV nodal ablation 4. PAD 5. CAD 6. Mitral valve replacement 7. ETOH abuse 8. Smoking/COPD - On home O2 during the day 9. AKI on CKD stage 3 10. Neuro 11. Metabolic encephalopathy  Hospital Course:   Admitted 10/21/17 with A/C systolic HF and A/C respiratory failure on home oxygen at 2.5 L.He is a current every day smoker. He required BiPAP and was intubated 5/21-5/23 for AMS to protect his airway. EEG showed diffuse cerebral dysfunction. Mental status improved with initiation of lactulose. Diuresed 10 lbs with IV lasix and diamox, then transitioned to torsemide 40 mg daily.He has been discharged with BiPAP, but needs a sleep study. He ws also discharged on home nebs   1.Acute on chronic systolic BMW:UXLKG ischemic/nonischemic (ETOH) cardiomyopathy. S/p Medtronic CRT-D. Echo 10/23/17: EF 30-35%. - Diuresed 10 lbs with IV lasix and diamox. Transitioned to torsemide 40 mg daily -Continue digoxin 0.125 daily, level 0.7 11/03/17 - Continue coreg to 6.25 mg bid. -Continue losartan 12.5 mg daily - Spironolactone was not started due to higher K (4.9 on day of discharge) 2. Acute on chronic hypercarbic resp  failure:Suspect OSA but has not had sleep study. Followed by pulmonary in the hospital, have recommended home Bipap and sleep study.  - Case manager has arranged for BIPAP to be delivered to room today. - Spoke with pulmonary - Will keep BiPAP settings that he has used in the hospital. Continue nebulizer treatments outpatient.  - Will need sleep study as outpatient.  - He has follow up with pulmonary on 11/11/17.  3. Atrial fibrillation:Chronic,s/p AV nodal ablation. - Eliquis continued. No s/s bleeding.  4. PAD:No claudication symptoms:Stable. Follows with [facility].  5. CAD: SevereLM stenosis found 1/19=>not a CABG candidate due to severe COPD and not PCI candidate due to inability to use Impelladue to severe PAD(have reviewed extensively with interventional cardiology).No CP during admission.  - No ASA with stable CAD and need for Eliquis.  - Continue statin.  6. Mitral valve replacement:Bioprosthetic valve currently, originally Bjork-Shiley. Stable on echo this admission.  7. ETOH abuse: Says he has quit drinking. - Encouraged continued cessation.  8. Smoking/COPD: - Still smoking. Encouraged complete cessation. - Continue home oxygen 2.5 L during the day, will need Bipap at night.  9. AKI on CKD Stage 3:Creatine peaked at 1.86. Daily BMET monitored. Stable at 1.0 on day of discharge.  10. Neuro: Baseline bipolar disorder and dementia. Suspect hypercarbia in setting of severe COPD and OSA is part of the cause, also with elevated NH3 and concern for hepatic encephalopathy. - Started on lactulose with improvement in AMS.   - Continue lactulose 30 g BID - ST consulted and recommends Dysphagia 3 Honey Thick diet. Thickner ordered at discharge.  11. Metabolic Encephalopathy: PaCO2 elevated when intubated but actually lower than prior days, cannot explain all AMS by hypercarbia. NH3 mildly elevated. EEG 10/26/17 1. Moderatediffuse backgroundslowingwith occasional triphasic waves,  2. Occasional epileptiform discharges over the left midtemporal and left occipital regions. We have been treating for hepatic encephalopathy with lactulose. Abdominal US, look for cirrhosis =>showed increased hepatic echogenicity. AMS improved by day of discharge.  - Continue Lactulose 30g bid for home.  - Will need to use Bipap qhs.  12. Deconditioning - HH PT, RN, ST, and aide. Family requests [facility] for [facility].  - Rolling walker, hospital bed, BiPAP, and nebulizer machine all ordered for home.  Patient will be followed closely in the HF clinic.>> Discharge Weight Range: 156 lbs   Pt. Presents for follow up. He states he is doing well.He is currently using his BiPAP daily at the setting set in the hospital.He was discharged to [facility] for continued rehab due to deconditioning, but is now home with his daughter. He is compliant with his Brovana nebs BID and his Pulmicort nebs BID. He has DuoNebs for rescue q 4 prn. He is compliant with his oxygen at 2.5 L. He denies wheezing or cough.He states he has been compliant with his Lasix, and has follow up with the [facility] Failure [facility]. He states he is not drinking ETOH at present. He states he is smoking, but per his daughter he is not smoking. He is not drinking.He is wearing his BiPAP at night . He does not like the mask. He has good family care at home.He denies any chest pain or fever, orthopnea ot hemoptysis. Weight today is 156 pounds. He does not get BLE swelling, but he does have increase in the size of his abdominal girth when he is overloaded. .   STUDIES:  CXR 11/11/2017>> IMPRESSION: Persistent increased markings at the lung bases, favorscarring/fibrosis. Mild cardiomegaly. CT Chest 5/16 >> Marked cardiomegaly and pulmonary venous congestion. Focal infiltrate in the right base and perhaps the right middle lobe is suggestive of pneumonia or aspiration.The main pulmonary artery is mildly dilated measuring 3.2 cm raising the possibility of  pulmonary arterial hypertension  ECHO 5/18 >> Mild LVH. Systolic function was moderately to severely reduced. The estimated ejection fraction was in the range of 30% to 35%.   Pulmonary arteries: Systolic pressure was moderately increased.  PA peak pressure: 65 mm Hg (S). EEG 5/21 >> moderate diffuse background slowing with occasional triphasic waves (typically seen with hepatic encephalopathy), occasional epileptiform discharges over the left mid-temporal & left occipital regions CT Head 5/21 >> no acute intracranial abnormality.  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 Slightly dilated gallbladder which contains small amount of stones and sludge. Wall thickness upper normal. No definitive sonographic evidence for cholecystitis or biliary dilatation 2. Increased hepatic echogenicity which may be secondary to steatosis or hepatocellular disease. No focal hepatic abnormality. CULTURES RVP 5/16 >> negative   ANTIBIOTICS Unasyn 5/20 >>5/22    LINES  ETT 5/21 >> 5/23    CBC Latest Ref Rng & Units 10/29/2017 10/28/2017 10/27/2017  WBC 4.0 - 10.5 K/uL 11.7(H) 11.5(H) 12.6(H)  Hemoglobin 13.0 - 17.0 g/dL 38.1 82.9 93.7  Hematocrit 39.0 - 52.0 % 44.1 46.4 46.0  Platelets 150 - 400 K/uL 183 193 202    BMP Latest Ref Rng & Units 11/03/2017 11/02/2017 11/01/2017  Glucose 65 - 99 mg/dL 169(C) 789(F) 810(F)  BUN 6 - 20 mg/dL 75(Z) 02(H) 85(I)  Creatinine 0.61 - 1.24 mg/dL 1.61 0.96 0.45  BUN/Creat Ratio 10 - 24 - - -  Sodium 135 - 145 mmol/L 142 144 141  Potassium 3.5 - 5.1 mmol/L 4.9 4.8 5.0  Chloride 101 - 111 mmol/L 104 108 102  CO2 22 - 32 mmol/L 32 30 32  Calcium 8.9 - 10.3 mg/dL 9.5 9.4 9.3    BNP    Component Value Date/Time   BNP 456.6 (H) 10/21/2017 1842    ProBNP    Component Value Date/Time   PROBNP 1,108 (H) 05/25/2017 1152   PROBNP 575.8 (H) 02/28/2014 1417    PFT No results found for: FEV1PRE, FEV1POST, FVCPRE,  FVCPOST, TLC, DLCOUNC, PREFEV1FVCRT, PSTFEV1FVCRT  Dg Chest 2 View  Result Date: 11/11/2017 CLINICAL DATA:  Follow-up pneumonia EXAM: CHEST - 2 VIEW COMPARISON:  10/28/2017 FINDINGS: Cardiomegaly. Prior median sternotomy and valve replacement. Left AICD remains in place, unchanged. Interval removal of endotracheal tube and NG tube. Persistent linear bibasilar opacities, favor scarring. Calcified pleural plaques noted at the left base with left pleural thickening/scarring. No visible acute infiltrates or effusions. No acute bony abnormality. IMPRESSION: Persistent increased markings at the lung bases, favor scarring/fibrosis. Mild cardiomegaly. Electronically Signed   By: Charlett Nose M.D.   On: 11/11/2017 10:50   Dg Chest 2 View  Result Date: 10/21/2017 CLINICAL DATA:  Failure to thrive.  Hypoxia. EXAM: CHEST - 2 VIEW COMPARISON:  03/02/2016 FINDINGS: Bilateral diffuse mild interstitial thickening. Prominence of the central pulmonary vasculature. No focal consolidation, pleural effusion or pneumothorax. Calcified pleural plaque along the left diaphragmatic surface. Stable cardiomegaly. Prior median sternotomy and mitral valve replacement. Cardiac pacemaker is present. No acute osseous abnormality. IMPRESSION: 1. Cardiomegaly with pulmonary vascular congestion. 2. Likely underlying mild chronic interstitial lung disease. Electronically Signed   By: Elige Ko   On: 10/21/2017 14:00   Ct Head Wo Contrast  Result Date: 10/26/2017 CLINICAL DATA:  Altered mental status. EXAM: CT HEAD WITHOUT CONTRAST TECHNIQUE: Contiguous axial images were obtained from the base of the skull through the vertex without intravenous contrast. COMPARISON:  CT head dated July 19, 2013. FINDINGS: Brain: Multiple old infarcts involving the right frontal lobe, left temporal and parietal lobes, right insula, right basal ganglia, and bilateral cerebellar hemispheres, overall similar to prior study. No evidence of acute infarction,  hemorrhage, hydrocephalus, extra-axial collection or mass lesion/mass effect. Stable atrophy and chronic microvascular ischemic changes. Vascular: Atherosclerotic vascular calcification of the carotid siphons. No hyperdense vessel. Skull: Negative for fracture or focal lesion. Sinuses/Orbits: No acute finding. Minimal opacification of the right mastoid air cells. Other: None. IMPRESSION: 1. No acute intracranial abnormality. Multiple old bilateral cerebral and cerebellar infarcts are unchanged. Electronically Signed   By: Obie Dredge M.D.   On: 10/26/2017 12:06   Ct Chest Wo Contrast  Result Date: 10/21/2017 CLINICAL DATA:  Decreased appetite.  Acute respiratory illness. EXAM: CT CHEST WITHOUT CONTRAST TECHNIQUE: Multidetector CT imaging of the chest was performed following the standard protocol without IV contrast. COMPARISON:  CT scan February 07, 2014.  Chest x-ray Oct 21, 2017. FINDINGS: Cardiovascular: Atherosclerotic changes in the nonaneurysmal aorta. The main pulmonary artery measures 3.2 cm. Cardiomegaly noted. Coronary artery calcifications identified. Mediastinum/Nodes: The thyroid and esophagus are normal. Stable shotty nodes in the mediastinum are likely reactive. Chest wall is normal. No effusions. Lungs/Pleura: Central airways are normal. No pneumothorax. Atelectasis in the left lung on axial image 42. Chronic changes in the left base. Focal infiltrate in the right base. Probable pulmonary  venous congestion. Opacity in the right middle lobe may represent atelectasis versus infiltrate. No suspicious nodules or masses. Upper Abdomen: Cholelithiasis in the gallbladder. The gallbladder wall is mildly prominent, possibly due to poor distention. Musculoskeletal: No chest wall mass or suspicious bone lesions identified. IMPRESSION: 1. Marked cardiomegaly and pulmonary venous congestion. 2. Focal infiltrate in the right base and perhaps the right middle lobe is suggestive of pneumonia or aspiration.  Recommend follow-up to resolution. 3. Atherosclerotic changes in the nonaneurysmal aorta. Coronary artery calcifications. 4. The main pulmonary artery is mildly dilated measuring 3.2 cm raising the possibility of pulmonary arterial hypertension. 5. Shotty nodes in the mediastinum are likely reactive. 6. Cholelithiasis. The gallbladder wall is mildly prominent, possibly due to poor distention. Recommend clinical correlation. Aortic Atherosclerosis (ICD10-I70.0). Electronically Signed   By: Gerome Sam III M.D   On: 10/21/2017 17:53   Dg Chest Port 1 View  Result Date: 10/28/2017 CLINICAL DATA:  Item endotracheal tube, acute respiratory failure EXAM: PORTABLE CHEST 1 VIEW COMPARISON:  Chest x-ray of 10/26/2017 FINDINGS: The tip of the endotracheal tube is approximately 3.6 cm above the carina. Aeration has improved with decreasing basilar atelectasis. Pacer and AICD leads remain and right central venous line tip overlies the right atrium. Cardiomegaly is stable. IMPRESSION: 1. Improved aeration with decreasing basilar atelectasis. 2. Endotracheal tube tip 3.6 cm above the carina. 3. Stable cardiomegaly with pacer and AICD leads. Electronically Signed   By: Dwyane Dee M.D.   On: 10/28/2017 09:29   Dg Chest Port 1 View  Result Date: 10/26/2017 CLINICAL DATA:  Post right PICC line placement EXAM: PORTABLE CHEST 1 VIEW COMPARISON:  10/26/2017 FINDINGS: Right PICC line is in place. The tip is in the right atrium approximately 4 cm below the cavoatrial junction. Left AICD remains unchanged. Endotracheal tube and NG tube are unchanged. Cardiomegaly. Bilateral perihilar and lower lobe opacities have increased since prior study. Increasing small to moderate left effusion. Calcified pleural plaques along the left hemidiaphragm. IMPRESSION: Worsening perihilar and lower lobe airspace opacities, likely edema. Small to moderate left effusion has increased since prior study. Right PICC line tip is in the right atrium  approximately 4 cm below the cavoatrial junction. Electronically Signed   By: Charlett Nose M.D.   On: 10/26/2017 14:29   Dg Chest Port 1 View  Result Date: 10/26/2017 CLINICAL DATA:  Evaluate endotracheal tube and OG tube placement EXAM: PORTABLE CHEST 1 VIEW COMPARISON:  CT chest of 10/21/2017 and chest x-ray of the same date FINDINGS: The tip of the endotracheal tube is approximately 4.1 cm above the carina. The OG tube extends below the hemidiaphragm. There is little change in cardiomegaly and pulmonary vascular congestion. AICD leads remain. IMPRESSION: 1. Tip of endotracheal tube 4.1 cm above the carina. 2. OG tube extends into the stomach. 3. Little change in cardiomegaly and pulmonary vascular congestion. Electronically Signed   By: Dwyane Dee M.D.   On: 10/26/2017 10:58   Dg Swallowing Func-speech Pathology  Result Date: 10/31/2017 Objective Swallowing Evaluation: Type of Study: MBS-Modified Barium Swallow Study  Patient Details Name: NURI LARMER MRN: [id number] Date of Birth: 08/03/1945 Today's Date: 10/31/2017 Time: SLP Start Time (ACUTE ONLY): 1020 -SLP Stop Time (ACUTE ONLY): 1045 SLP Time Calculation (min) (ACUTE ONLY): 25 min Past Medical History: Past Medical History: Diagnosis Date . AICD (automatic cardioverter/defibrillator) present  . Alcohol abuse  . Anxiety  . Bipolar disorder (HCC)  . Biventricular automatic implantable cardioverter defibrillator in situ   a.  01/2014 s/p MDT [id number] Coletta Memos CRT-D (ser # [id number]). . CAD (coronary artery disease)   a. s/p prior PCI/stenting of the LCX;  b. 08/2014 MV: EF 20%, large septal, apical, and inferior infarct from apex to base, no ischemia;  c. 08/2014 NSTEMI/Cath: LM mod distal dzs extending into ostial LCX (80%), LAD tortuous, RI nl, OM mod dzs, RCA 100 CTO with R->R and L->R collats-->Med Rx. . Chronic atrial fibrillation (HCC)   a. CHA2DS2VASc = 6->eliquis;  b. S/P AVN RFCA an BiV ICD placement. . Chronic respiratory failure (HCC)  . Chronic  systolic congestive heart failure (HCC)   a. 01/2014 Echo: EF 25-30%, mod conc LVH, mod dil LA. . CKD (chronic kidney disease)   a. suspect stage II-III based on historical labs. . Complete heart block (HCC)   a. In setting of prior AV nodal ablation r/t afib-->BiV ICD (01/2014). . COPD (chronic obstructive pulmonary disease) (HCC)  . CVA (cerebral vascular accident) (HCC)   a. Multiple prior embolic strokes. . Dementia  . Depression  . DVT (deep venous thrombosis) (HCC)  . Dyslipidemia  . Hyperglycemia  . Hypertension  . Mitral valve disease   a. remote mitral replacement with Bjork Shiley valve;  b. 06/2006 Redo MVR with tissue valve. . Mixed Ischemic and Nonischemic Cardiomyopathy   a. 8/.2015 Echo: Ef 25-30%;  b. 01/2014 s/p MDT [id number] Talbot Grumbling XT CRT-D (ser # [id number]). . On home oxygen therapy   "2L; 24/7" (65/16/2019) . Psoriasis   "back of his head; elbows; finger of left hand" (06/07/2017) . Ventral hernia  Past Surgical History: Past Surgical History: Procedure Laterality Date . AV NODE ABLATION  2007 . CARDIAC CATHETERIZATION  06/2006  Mild ostial L main stenosis, CFX stent patent, RCA occluded (old) . CORONARY ARTERY BYPASS GRAFT  2008 . CYSTOSCOPY W/ URETERAL STENT PLACEMENT  07/12/2011  Procedure: CYSTOSCOPY WITH RETROGRADE PYELOGRAM/URETERAL STENT PLACEMENT;  Surgeon: Sebastian Ache, MD;  Location: [facility] OR;  Service: Urology;  Laterality: Left; . HERNIA REPAIR   . ICD GENERATOR CHANGE  02/01/2014  Gen change to: Medtronic VIVA pulse generator, serial number [id number] . INSERT / REPLACE / REMOVE PACEMAKER   . LEFT HEART CATHETERIZATION WITH CORONARY ANGIOGRAM N/A 08/23/2014  Procedure: LEFT HEART CATHETERIZATION WITH CORONARY ANGIOGRAM;  Surgeon: Corky Crafts, MD;  Location: [facility] CATH LAB;  Service: Cardiovascular;  Laterality: N/A; . MITRAL VALVE REPLACEMENT    remote [facility] L [facility] C valve [id number] with redo tissue valve 06/2006 . PACEMAKER GENERATOR CHANGE N/A 02/01/2014  Procedure: PACEMAKER GENERATOR CHANGE;   Surgeon: Duke Salvia, MD;  Location: [facility] CATH LAB;  Service: Cardiovascular;  Laterality: N/A; . PACEMAKER PLACEMENT  2006  Changed to CRT-D in 2009 . RIGHT/LEFT HEART CATH AND CORONARY ANGIOGRAPHY N/A 06/09/2017  Procedure: RIGHT/LEFT HEART CATH AND CORONARY ANGIOGRAPHY;  Surgeon: [name], MD;  Location: [facility] INVASIVE CV LAB;  Service: Cardiovascular;  Laterality: N/A; HPI: 72 y.o. male sent to ER for decreased appetite, increased fatigue and failure to thrive with generalized weakness. PMH includes: chronic combined HF, COPD, a fib, pacemaker placement, mitral valve replacement CAD, CVA, chronic respiratory failure, Dementia, on home oxygen. Pt had a BSE in 2013 recommending regular diet and thin liquids. In 2017 he was on Dys 3 diet and nectar thick liquids due to suspected respiratory-based dysphagia, although he was able to d/c home on soft solids and thin liquids. Normal MBS in 2002.  Subjective: alert, pleasantly confused Assessment / Plan / Recommendation CHL IP CLINICAL IMPRESSIONS 10/31/2017 Clinical  Impression Patient presents with moderate oropharyngeal dyphagia and aspiration of thin, nectar thick liquids due to delayed swallow initiation. Oral stage noted for piecemealing with liquids. Thin and nectar thick liquids spill into pt's open airway and are aspirated before the swallow. Moderate amount of aspiration was sensed, however pt does not sense trace aspiration events. Tongue base retraction mildly decreased, resulting in base of tongue, vallecular residue with thicker consistencies and solids. Clears with cued dry swallow, though pt is inconsistent in following commands for this maneuver. Chin tuck was typically effective in reducing penetration events with nectar, however pt again had difficulty following commands to complete this reliably, and it did not prevent penetration with straw sips or larger amounts of nectar. As pt was exhibiting questionable signs of aspiration prior to intubation,  I suspect sensory component of his dysphagia may be related at least in part to dementia vs intubation, though certainly this may have exacerbated his issues acutely. Given his respiratory decline requiring intubation 5/21, would proceed with conservative diet to minimize aspiration risks during acute illness, with dys 3, honey thick liquids. If he is able to use a chin tuck reliably, would advance to nectar thick liquids. Consider repeat MBS with additional time post-extubation. SLP Visit Diagnosis Dysphagia, oropharyngeal phase (R13.12) Attention and concentration deficit following -- Frontal lobe and executive function deficit following -- Impact on safety and function Moderate aspiration risk   CHL IP TREATMENT RECOMMENDATION 10/31/2017 Treatment Recommendations Therapy as outlined in treatment plan below   Prognosis 10/31/2017 Prognosis for Safe Diet Advancement (No Data) Barriers to Reach Goals Cognitive deficits Barriers/Prognosis Comment -- CHL IP DIET RECOMMENDATION 10/31/2017 SLP Diet Recommendations Dysphagia 3 (Mech soft) solids;Honey thick liquids Liquid Administration via Cup Medication Administration Crushed with puree Compensations Minimize environmental distractions;Slow rate;Small sips/bites;Multiple dry swallows after each bite/sip Postural Changes Seated upright at 90 degrees   CHL IP OTHER RECOMMENDATIONS 10/31/2017 Recommended Consults -- Oral Care Recommendations Oral care BID Other Recommendations Order thickener from pharmacy;Remove water pitcher;Prohibited food (jello, ice cream, thin soups)   CHL IP FOLLOW UP RECOMMENDATIONS 10/31/2017 Follow up Recommendations Other (comment)   CHL IP FREQUENCY AND DURATION 10/31/2017 Speech Therapy Frequency (ACUTE ONLY) min 2x/week Treatment Duration 2 weeks      CHL IP ORAL PHASE 10/31/2017 Oral Phase Impaired Oral - Pudding Teaspoon -- Oral - Pudding Cup -- Oral - Honey Teaspoon Lingual/palatal residue;Piecemeal swallowing Oral - Honey Cup Lingual/palatal  residue;Piecemeal swallowing Oral - Nectar Teaspoon -- Oral - Nectar Cup Lingual/palatal residue;Piecemeal swallowing Oral - Nectar Straw Lingual/palatal residue;Piecemeal swallowing Oral - Thin Teaspoon Lingual/palatal residue;Piecemeal swallowing Oral - Thin Cup Lingual/palatal residue;Piecemeal swallowing Oral - Thin Straw -- Oral - Puree Lingual/palatal residue Oral - Mech Soft -- Oral - Regular Lingual/palatal residue Oral - Multi-Consistency -- Oral - Pill (No Data) Oral Phase - Comment --  CHL IP PHARYNGEAL PHASE 10/31/2017 Pharyngeal Phase Impaired Pharyngeal- Pudding Teaspoon -- Pharyngeal -- Pharyngeal- Pudding Cup -- Pharyngeal -- Pharyngeal- Honey Teaspoon Reduced tongue base retraction;Pharyngeal residue - valleculae;Delayed swallow initiation-vallecula Pharyngeal -- Pharyngeal- Honey Cup Reduced tongue base retraction;Pharyngeal residue - valleculae;Delayed swallow initiation-vallecula Pharyngeal -- Pharyngeal- Nectar Teaspoon Delayed swallow initiation-pyriform sinuses;Penetration/Aspiration before swallow;Penetration/Aspiration during swallow;Trace aspiration;Pharyngeal residue - valleculae Pharyngeal Material enters airway, remains ABOVE vocal cords and not ejected out Pharyngeal- Nectar Cup Delayed swallow initiation-pyriform sinuses;Penetration/Aspiration before swallow;Penetration/Aspiration during swallow;Trace aspiration Pharyngeal Material enters airway, passes BELOW cords without attempt by patient to eject out (silent aspiration);Material enters airway, remains ABOVE vocal cords and not ejected out Pharyngeal-  Nectar Straw Delayed swallow initiation-pyriform sinuses;Reduced tongue base retraction;Penetration/Aspiration before swallow;Penetration/Aspiration during swallow;Trace aspiration;Pharyngeal residue - valleculae Pharyngeal -- Pharyngeal- Thin Teaspoon Delayed swallow initiation-pyriform sinuses;Penetration/Aspiration before swallow;Trace aspiration Pharyngeal Material enters airway,  passes BELOW cords without attempt by patient to eject out (silent aspiration) Pharyngeal- Thin Cup Delayed swallow initiation-pyriform sinuses;Reduced tongue base retraction;Penetration/Aspiration before swallow;Moderate aspiration;Pharyngeal residue - valleculae Pharyngeal Material enters airway, passes BELOW cords and not ejected out despite cough attempt by patient Pharyngeal- Thin Straw -- Pharyngeal -- Pharyngeal- Puree Delayed swallow initiation-vallecula;Reduced tongue base retraction;Pharyngeal residue - valleculae Pharyngeal -- Pharyngeal- Mechanical Soft -- Pharyngeal -- Pharyngeal- Regular Delayed swallow initiation-vallecula;Reduced tongue base retraction;Pharyngeal residue - valleculae Pharyngeal -- Pharyngeal- Multi-consistency -- Pharyngeal -- Pharyngeal- Pill Delayed swallow initiation-vallecula;Reduced tongue base retraction;Pharyngeal residue - valleculae Pharyngeal -- Pharyngeal Comment --  CHL IP CERVICAL ESOPHAGEAL PHASE 10/31/2017 Cervical Esophageal Phase WFL Pudding Teaspoon -- Pudding Cup -- Honey Teaspoon -- Honey Cup -- Nectar Teaspoon -- Nectar Cup -- Nectar Straw -- Thin Teaspoon -- Thin Cup -- Thin Straw -- Puree -- Mechanical Soft -- Regular -- Multi-consistency -- Pill -- Cervical Esophageal Comment -- Rondel Baton, MS, CCC-SLP Speech-Language Pathologist [phone] No flowsheet data found. Arlana Lindau 10/31/2017, 11:46 AM              [place] Ekg Site Rite  Result Date: 10/26/2017 If [facility] image not attached, placement could not be confirmed due to current cardiac rhythm.  US Abdomen Limited Ruq  Result Date: 10/28/2017 CLINICAL DATA:  Cirrhosis EXAM: ULTRASOUND ABDOMEN LIMITED RIGHT UPPER QUADRANT COMPARISON:  08/23/2017 CT FINDINGS: Gallbladder: Slightly dilated gallbladder with small amount of sludge and stones. Negative sonographic Murphy. Upper normal wall thickness at 3.2 mm Common bile duct: Diameter: 4.2 mm Liver: Increased echogenicity. No focal hepatic  abnormality. Limited visualization due to positioning. Portal vein is patent on color Doppler imaging with normal direction of blood flow towards the liver. IMPRESSION: 1. Slightly dilated gallbladder which contains small amount of stones and sludge. Wall thickness upper normal. No definitive sonographic evidence for cholecystitis or biliary dilatation 2. Increased hepatic echogenicity which may be secondary to steatosis or hepatocellular disease. No focal hepatic abnormality. Electronically Signed   By: Jasmine Pang M.D.   On: 10/28/2017 02:56     Past medical hx Past Medical History:  Diagnosis Date  . AICD (automatic cardioverter/defibrillator) present   . Alcohol abuse   . Anxiety   . Bipolar disorder (HCC)   . Biventricular automatic implantable cardioverter defibrillator in situ    a. 01/2014 s/p MDT [id number] Viva XT CRT-D (ser # [id number]).  . CAD (coronary artery disease)    a. s/p prior PCI/stenting of the LCX;  b. 08/2014 MV: EF 20%, large septal, apical, and inferior infarct from apex to base, no ischemia;  c. 08/2014 NSTEMI/Cath: LM mod distal dzs extending into ostial LCX (80%), LAD tortuous, RI nl, OM mod dzs, RCA 100 CTO with R->R and L->R collats-->Med Rx.  . Chronic atrial fibrillation (HCC)    a. CHA2DS2VASc = 6->eliquis;  b. S/P AVN RFCA an BiV ICD placement.  . Chronic respiratory failure (HCC)   . Chronic systolic congestive heart failure (HCC)    a. 01/2014 Echo: EF 25-30%, mod conc LVH, mod dil LA.  . CKD (chronic kidney disease)    a. suspect stage II-III based on historical labs.  . Complete heart block (HCC)    a. In setting of prior AV nodal ablation r/t afib-->BiV ICD (01/2014).  . COPD (  chronic obstructive pulmonary disease) (HCC)   . CVA (cerebral vascular accident) (HCC)    a. Multiple prior embolic strokes.  . Dementia   . Depression   . DVT (deep venous thrombosis) (HCC)   . Dyslipidemia   . Hyperglycemia   . Hypertension   . Mitral valve disease    a.  remote mitral replacement with Bjork Shiley valve;  b. 06/2006 Redo MVR with tissue valve.  . Mixed Ischemic and Nonischemic Cardiomyopathy    a. 8/.2015 Echo: Ef 25-30%;  b. 01/2014 s/p MDT [id number] Talbot Grumbling XT CRT-D (ser # [id number]).  . On home oxygen therapy    "2L; 24/7" (65/16/2019)  . Psoriasis    "back of his head; elbows; finger of left hand" (06/07/2017)  . Ventral hernia      Social History   Tobacco Use  . Smoking status: Current Every Day Smoker    Packs/day: 2.00    Years: 45.00    Pack years: 90.00    Types: Cigarettes  . Smokeless tobacco: Never Used  Substance Use Topics  . Alcohol use: Yes    Alcohol/week: 12.6 oz    Types: 21 Cans of beer per week    Comment: 10/21/2017 "~ 2-3 beers/day"  . Drug use: No    Mr.Laws reports that he has been smoking cigarettes.  He has a 90.00 pack-year smoking history. He has never used smokeless tobacco. He reports that he drinks about 12.6 oz of alcohol per week. He reports that he does not use drugs.  Tobacco Cessation: Current every day smoker>> No smoking since discharge I have spent 4 minutes counseling patient on smoking cessation this visit. I counseled patient and daughter on risks of smoking while on oxygen to include flash burns and death. Past surgical hx, Family hx, Social hx all reviewed.  Current Outpatient Medications on File Prior to Visit  Medication Sig  . apixaban (ELIQUIS) 5 MG TABS tablet Take 1 tablet (5 mg total) by mouth 2 (two) times daily.  Marland Kitchen arformoterol (BROVANA) 15 MCG/2ML NEBU Take 2 mLs (15 mcg total) by nebulization 2 (two) times daily for 15 days.  Marland Kitchen arformoterol (BROVANA) 15 MCG/2ML NEBU Take 2 mLs (15 mcg total) by nebulization 2 (two) times daily.  Marland Kitchen atorvastatin (LIPITOR) 40 MG tablet Take 1 tablet (40 mg total) by mouth daily. (Patient taking differently: Take 40 mg by mouth daily at 6 PM. )  . budesonide (PULMICORT) 0.5 MG/2ML nebulizer solution Take 2 mLs (0.5 mg total) by nebulization 2  (two) times daily.  . carvedilol (COREG) 6.25 MG tablet Take 1 tablet (6.25 mg total) by mouth 2 (two) times daily with a meal. (Patient taking differently: Take 6.25 mg by mouth daily. )  . digoxin (LANOXIN) 0.125 MG tablet Take 1 tablet (0.125 mg total) by mouth daily.  . food thickener (THICK IT) POWD Honey- thick liquids. Follow instructions on container.  Marland Kitchen glipiZIDE (GLUCOTROL) 5 MG tablet Take 5 mg daily before breakfast by mouth.  Marland Kitchen ipratropium-albuterol (DUONEB) 0.5-2.5 (3) MG/3ML SOLN Take 3 mLs by nebulization every 4 (four) hours as needed.  . lactulose (CHRONULAC) 10 GM/15ML solution Take 45 mLs (30 g total) by mouth 2 (two) times daily.  Marland Kitchen loratadine (CLARITIN) 10 MG tablet Take 1 tablet (10 mg total) by mouth daily.  Marland Kitchen losartan (COZAAR) 25 MG tablet Take 0.5 tablets (12.5 mg total) by mouth daily.  . Multiple Vitamin (MULTIVITAMIN WITH MINERALS) TABS tablet Take 1 tablet by mouth daily.  . nitroGLYCERIN (  NITROSTAT) 0.4 MG SL tablet Place 1 tablet (0.4 mg total) under the tongue every 5 (five) minutes as needed for chest pain.  . OXYGEN Inhale 2.5 L into the lungs continuous.   Marland Kitchen senna-docusate (SENOKOT-S) 8.6-50 MG tablet Take 1 tablet by mouth daily. Hold for diarrhea.  . thiamine 100 MG tablet Take 1 tablet (100 mg total) by mouth daily.  Marland Kitchen torsemide (DEMADEX) 20 MG tablet Take 2 tablets (40 mg total) by mouth daily.   No current facility-administered medications on file prior to visit.      Allergies  Allergen Reactions  . Warfarin Other (See Comments)    Non compliance and ETOH abuse    Review Of Systems:  Constitutional:   No  weight loss, night sweats,  Fevers, chills, + fatigue, or  lassitude.  HEENT:   No headaches,  Difficulty swallowing,  Tooth/dental problems, or  Sore throat,                No sneezing, itching, ear ache, nasal congestion, post nasal drip,   CV:  No chest pain,  Orthopnea, PND, swelling in lower extremities, anasarca, dizziness,  palpitations, syncope.   GI  No heartburn, indigestion, abdominal pain, nausea, vomiting, diarrhea, change in bowel habits, loss of appetite, bloody stools.   Resp: + shortness of breath with exertion or at rest.  No excess mucus, no productive cough,  + improving  non-productive cough,  No coughing up of blood.  No change in color of mucus.  No wheezing.  No chest wall deformity  Skin: no rash or lesions.  GU: no dysuria, change in color of urine, no urgency or frequency.  No flank pain, no hematuria   MS:  No joint pain or swelling.  No decreased range of motion.  No back pain.  Psych:  No change in mood or affect. No depression or anxiety.  No memory loss.   Vital Signs BP 108/60 (BP Location: Left Arm, Cuff Size: Normal)   Pulse 77   Ht 5\' 7"  (1.702 m)   Wt 156 lb 3.2 oz (70.9 kg)   SpO2 95%   BMI 24.46 kg/m    Physical Exam:  General- No distress,  A&Ox3,pleasant , thin male ENT: No sinus tenderness, TM clear, pale nasal mucosa, no oral exudate,no post nasal drip, no LAN Cardiac: S1, S2, regular rate and rhythm, no murmur Chest: No wheeze/ rales/ dullness; no accessory muscle use, no nasal flaring, no sternal retractions, diminished right base Abd.: Soft Non-tender, ND , BS + Ext: No clubbing cyanosis, edema Neuro:  Deconditioned at baseline, MAE x 4, A&O x 3, confused at times, follows commands, answers questions Skin: No rashes, warm and dry Psych: normal mood and behavior, calm and interactive   Assessment/Plan  COPD with chronic bronchitis and emphysema (HCC) Currently stable Swallow study >> Dysphasia 3 Diet  Plan: CXR today. We will call you with results Ambulatory DME for concentrator filter requested./ and all supplies. DME order for POC ( [employer]) We will walk you today on the portable oxygen Wear your oxygen at 3 L [place] pulsed and 2 L [place] continuous Continue supplemental O2 as needed to maintain SpO2 > 88%.- 92% You need to wear your oxygen with  exertion. When you are better we will schedule PFT's Continue Brovana, Pulmicort, twice daily DuoNeb as needed for breakthrough shortness of breath. Referral to Pulmonary rehab Follow up with Dr. [name] in 3 months with PFT's  Please contact office for sooner follow up  if symptoms do not improve or worsen or seek emergency care    Chronic respiratory failure with hypoxia and hypercapnia (HCC) Hospitalized 10/2017 requiring intubation BiPap initiated at hospitalization Plan: Continue oxygen at 2 L continuous flow and 3 L pulsed flow Saturation goals are 88-92% Continue nightly BiPAP.  He will need this long-term as outpatient. We will schedule a sleep lab sleep study. When you are better we will schedule PFT's Ambulatory referral for Cardiac/Pulmonay Rehab ( Dr. [name]) Follow up with Dr. [name] in 3 months with PFT's  Please contact office for sooner follow up if symptoms do not improve or worsen or seek emergency care    CHF exacerbation [facility]) Compliant with diuretic and daily weights Weight stable since discharge at 156 pounds Plan: Continue diuretic per cardiology Follow up with heart failure clinic 6/7/ 2019 at  3:30 as scheduled. Weights every day Low salt diet ( 1500 mg daily) Heart Health diet Ambulatory referral for Cardiac/Pulmonay Rehab ( Dr. [name]) Follow up with Dr. [name] in 3 months with PFT's  Please contact office for sooner follow up if symptoms do not improve or worsen or seek emergency care    Tobacco abuse No smoking since discharge with careful family monitoring ( Pt. Will smoke if the opportunity arises) Plan: Work on quitting smoking I have spent 3 minutes counseling patient on smoking cessation this visit. This was reinforced with family present  Dementia CT head done as inpatient showed no new acute processes Plan: Monitor    Bevelyn Ngo, NP 11/11/2017  12:38 PM

## 2017-11-12 ENCOUNTER — Ambulatory Visit (HOSPITAL_COMMUNITY)
Admit: 2017-11-12 | Discharge: 2017-11-12 | Disposition: A | Discharge status: Home / Self Care | Payer: Medicare Other | Attending: Cardiology | Admitting: Cardiology

## 2017-11-12 ENCOUNTER — Encounter (HOSPITAL_COMMUNITY): Payer: Self-pay | Admitting: Cardiology

## 2017-11-12 VITALS — BP 122/78 | HR 69 | Wt 158.0 lb

## 2017-11-12 DIAGNOSIS — I426 Alcoholic cardiomyopathy: Secondary | ICD-10-CM | POA: Insufficient documentation

## 2017-11-12 DIAGNOSIS — F0390 Unspecified dementia without behavioral disturbance: Secondary | ICD-10-CM | POA: Diagnosis not present

## 2017-11-12 DIAGNOSIS — I2510 Atherosclerotic heart disease of native coronary artery without angina pectoris: Secondary | ICD-10-CM | POA: Diagnosis not present

## 2017-11-12 DIAGNOSIS — F319 Bipolar disorder, unspecified: Secondary | ICD-10-CM | POA: Diagnosis not present

## 2017-11-12 DIAGNOSIS — Z79899 Other long term (current) drug therapy: Secondary | ICD-10-CM | POA: Insufficient documentation

## 2017-11-12 DIAGNOSIS — K7460 Unspecified cirrhosis of liver: Secondary | ICD-10-CM | POA: Diagnosis not present

## 2017-11-12 DIAGNOSIS — Z9981 Dependence on supplemental oxygen: Secondary | ICD-10-CM | POA: Diagnosis not present

## 2017-11-12 DIAGNOSIS — Z86718 Personal history of other venous thrombosis and embolism: Secondary | ICD-10-CM | POA: Diagnosis not present

## 2017-11-12 DIAGNOSIS — Z952 Presence of prosthetic heart valve: Secondary | ICD-10-CM | POA: Insufficient documentation

## 2017-11-12 DIAGNOSIS — Z7984 Long term (current) use of oral hypoglycemic drugs: Secondary | ICD-10-CM | POA: Diagnosis not present

## 2017-11-12 DIAGNOSIS — Z87891 Personal history of nicotine dependence: Secondary | ICD-10-CM | POA: Diagnosis not present

## 2017-11-12 DIAGNOSIS — I482 Chronic atrial fibrillation, unspecified: Secondary | ICD-10-CM

## 2017-11-12 DIAGNOSIS — J449 Chronic obstructive pulmonary disease, unspecified: Secondary | ICD-10-CM | POA: Insufficient documentation

## 2017-11-12 DIAGNOSIS — Z8249 Family history of ischemic heart disease and other diseases of the circulatory system: Secondary | ICD-10-CM | POA: Diagnosis not present

## 2017-11-12 DIAGNOSIS — Z8673 Personal history of transient ischemic attack (TIA), and cerebral infarction without residual deficits: Secondary | ICD-10-CM | POA: Insufficient documentation

## 2017-11-12 DIAGNOSIS — Z955 Presence of coronary angioplasty implant and graft: Secondary | ICD-10-CM | POA: Diagnosis not present

## 2017-11-12 DIAGNOSIS — I110 Hypertensive heart disease with heart failure: Secondary | ICD-10-CM | POA: Diagnosis present

## 2017-11-12 DIAGNOSIS — G4733 Obstructive sleep apnea (adult) (pediatric): Secondary | ICD-10-CM | POA: Diagnosis not present

## 2017-11-12 DIAGNOSIS — Z09 Encounter for follow-up examination after completed treatment for conditions other than malignant neoplasm: Secondary | ICD-10-CM | POA: Insufficient documentation

## 2017-11-12 DIAGNOSIS — K7290 Hepatic failure, unspecified without coma: Secondary | ICD-10-CM | POA: Insufficient documentation

## 2017-11-12 DIAGNOSIS — I2582 Chronic total occlusion of coronary artery: Secondary | ICD-10-CM | POA: Diagnosis not present

## 2017-11-12 DIAGNOSIS — E785 Hyperlipidemia, unspecified: Secondary | ICD-10-CM | POA: Insufficient documentation

## 2017-11-12 DIAGNOSIS — I5022 Chronic systolic (congestive) heart failure: Secondary | ICD-10-CM | POA: Diagnosis not present

## 2017-11-12 DIAGNOSIS — Z7901 Long term (current) use of anticoagulants: Secondary | ICD-10-CM | POA: Diagnosis not present

## 2017-11-12 LAB — LIPID PANEL
CHOL/HDL RATIO: 3.8 ratio
Cholesterol: 140 mg/dL (ref 0–200)
HDL: 37 mg/dL — ABNORMAL LOW (ref >40)
LDL Cholesterol: 53 mg/dL (ref 0–99)
Triglycerides: 252 mg/dL — ABNORMAL HIGH (ref <150)
VLDL: 50 mg/dL — AB (ref 0–40)

## 2017-11-12 LAB — BASIC METABOLIC PANEL
Anion gap: 9 (ref 5–15)
BUN: 17 mg/dL (ref 6–20)
CALCIUM: 9.6 mg/dL (ref 8.9–10.3)
CO2: 34 mmol/L — ABNORMAL HIGH (ref 22–32)
CREATININE: 1.03 mg/dL (ref 0.61–1.24)
Chloride: 100 mmol/L — ABNORMAL LOW (ref 101–111)
GFR calc Af Amer: >60 mL/min (ref >60)
GLUCOSE: 120 mg/dL — AB (ref 65–99)
Potassium: 4.6 mmol/L (ref 3.5–5.1)
Sodium: 143 mmol/L (ref 135–145)

## 2017-11-12 LAB — DIGOXIN LEVEL: DIGOXIN LVL: 0.9 ng/mL (ref 0.8–2.0)

## 2017-11-12 LAB — AMMONIA: AMMONIA: 37 umol/L — AB (ref 9–35)

## 2017-11-12 MED ORDER — SPIRONOLACTONE 25 MG PO TABS: 12.5000 mg | ORAL_TABLET | Freq: Every day | ORAL | 3 refills | Status: DC

## 2017-11-12 MED ORDER — LACTULOSE 10 GM/15ML PO SOLN: 30.0000 g | Freq: Two times a day (BID) | ORAL | 0 refills | Status: DC

## 2017-11-12 NOTE — Patient Instructions (Signed)
Start Spironolactone 12.5 mg (1/2 tab) daily  Labs drawn today (if we do not call you, then your lab work was stable)   Your physician recommends that you return for lab work in: 1 week  Your physician recommends that you schedule a follow-up appointment in: Erika, Pharm D   Your physician recommends that you schedule a follow-up appointment in: 2 months with Dr. Shirlee Latch
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
## 2017-11-16 ENCOUNTER — Encounter (HOSPITAL_COMMUNITY): Payer: Self-pay

## 2017-11-17 ENCOUNTER — Other Ambulatory Visit (HOSPITAL_COMMUNITY): Payer: Self-pay | Admitting: Cardiology

## 2017-11-19 ENCOUNTER — Ambulatory Visit (HOSPITAL_COMMUNITY)
Admission: RE | Admit: 2017-11-19 | Discharge: 2017-11-19 | Disposition: A | Discharge status: Home / Self Care | Payer: Medicare Other | Source: Ambulatory Visit | Attending: Cardiology | Admitting: Cardiology

## 2017-11-19 DIAGNOSIS — I5022 Chronic systolic (congestive) heart failure: Secondary | ICD-10-CM | POA: Insufficient documentation

## 2017-11-19 LAB — BASIC METABOLIC PANEL
Anion gap: 7 (ref 5–15)
BUN: 17 mg/dL (ref 6–20)
CALCIUM: 9.4 mg/dL (ref 8.9–10.3)
CO2: 33 mmol/L — AB (ref 22–32)
Chloride: 96 mmol/L — ABNORMAL LOW (ref 101–111)
Creatinine, Ser: 1.2 mg/dL (ref 0.61–1.24)
GFR calc Af Amer: >60 mL/min (ref >60)
GFR, EST NON AFRICAN AMERICAN: 60 mL/min — AB (ref >60)
GLUCOSE: 350 mg/dL — AB (ref 65–99)
Potassium: 4.6 mmol/L (ref 3.5–5.1)
Sodium: 136 mmol/L (ref 135–145)

## 2017-11-22 ENCOUNTER — Ambulatory Visit (INDEPENDENT_AMBULATORY_CARE_PROVIDER_SITE_OTHER): Payer: Medicare Other | Admitting: *Deleted

## 2017-11-22 DIAGNOSIS — I5022 Chronic systolic (congestive) heart failure: Secondary | ICD-10-CM

## 2017-11-22 DIAGNOSIS — I255 Ischemic cardiomyopathy: Secondary | ICD-10-CM

## 2017-11-22 NOTE — Progress Notes (Signed)
Remote ICD transmission.   

## 2017-11-23 ENCOUNTER — Telehealth (HOSPITAL_COMMUNITY): Payer: Self-pay

## 2017-11-23 LAB — CUP PACEART REMOTE DEVICE CHECK
Battery Remaining Longevity: 23 mo
Battery Voltage: 2.94 V
HIGH POWER IMPEDANCE MEASURED VALUE: 40 Ohm
HIGH POWER IMPEDANCE MEASURED VALUE: 49 Ohm
Implantable Lead Implant Date: 20060307
Implantable Lead Implant Date: 20090617
Implantable Lead Location: 753858
Implantable Lead Location: 753859
Implantable Lead Location: 753860
Implantable Lead Model: 5071
Implantable Lead Model: 5076
Implantable Lead Model: 6947
Implantable Pulse Generator Implant Date: 20150827
Lead Channel Impedance Value: 532 Ohm
Lead Channel Pacing Threshold Amplitude: 0.625 V
Lead Channel Pacing Threshold Amplitude: 0.625 V
Lead Channel Pacing Threshold Pulse Width: 0.4 ms
Lead Channel Pacing Threshold Pulse Width: 0.4 ms
Lead Channel Sensing Intrinsic Amplitude: 0.75 mV
Lead Channel Sensing Intrinsic Amplitude: 24.5 mV
Lead Channel Setting Pacing Pulse Width: 0.4 ms
MDC IDC LEAD IMPLANT DT: 20080110
MDC IDC MSMT LEADCHNL LV IMPEDANCE VALUE: 380 Ohm
MDC IDC MSMT LEADCHNL LV IMPEDANCE VALUE: 4047 Ohm
MDC IDC MSMT LEADCHNL LV IMPEDANCE VALUE: 4047 Ohm
MDC IDC MSMT LEADCHNL LV PACING THRESHOLD AMPLITUDE: 1 V
MDC IDC MSMT LEADCHNL RA PACING THRESHOLD PULSEWIDTH: 0.4 ms
MDC IDC MSMT LEADCHNL RA SENSING INTR AMPL: 0.75 mV
MDC IDC MSMT LEADCHNL RV IMPEDANCE VALUE: 589 Ohm
MDC IDC MSMT LEADCHNL RV IMPEDANCE VALUE: 665 Ohm
MDC IDC MSMT LEADCHNL RV SENSING INTR AMPL: 24.5 mV
MDC IDC SESS DTM: 20190617083822
MDC IDC SET LEADCHNL LV PACING AMPLITUDE: 2.5 V
MDC IDC SET LEADCHNL RV PACING AMPLITUDE: 2.5 V
MDC IDC SET LEADCHNL RV PACING PULSEWIDTH: 0.4 ms
MDC IDC SET LEADCHNL RV SENSING SENSITIVITY: 0.45 mV
MDC IDC STAT BRADY AP VP PERCENT: 0 %
MDC IDC STAT BRADY AP VS PERCENT: 0 %
MDC IDC STAT BRADY AS VP PERCENT: 95.45 %
MDC IDC STAT BRADY AS VS PERCENT: 4.55 %
MDC IDC STAT BRADY RA PERCENT PACED: 0 %
MDC IDC STAT BRADY RV PERCENT PACED: 96.87 %

## 2017-11-23 NOTE — Telephone Encounter (Signed)
Pt insurance is active and benefits verified with Mitzi Hansen at [facility]. Co-pay $20.00, ded $0.00/$0.00 met, out of pocket $6,007.00/$ 2,512.05 met, co-insurance 0%. Pt does not require a pre-authorization. Andrew/[employer] @ 8:45 AM, [id number]

## 2017-11-24 ENCOUNTER — Telehealth: Payer: Self-pay | Admitting: Internal Medicine

## 2017-11-24 MED ORDER — ARFORMOTEROL TARTRATE 15 MCG/2ML IN NEBU: 15.0000 ug | INHALATION_SOLUTION | Freq: Two times a day (BID) | RESPIRATORY_TRACT | 5 refills | Status: DC

## 2017-11-24 MED ORDER — BUDESONIDE 0.5 MG/2ML IN SUSP: 0.5000 mg | Freq: Two times a day (BID) | RESPIRATORY_TRACT | 5 refills | Status: DC

## 2017-11-24 NOTE — Telephone Encounter (Addendum)
Spoke with pt's daughter, she Field seismologist needs some information faxed to them about these Brovana and Pulmicort nebulizer medications. I called [facility] to see if they could fax this again to the triage fax machine. Will await fax.   I noticed that the original prescriptions was written by Dr. Cathlean Marse[place] office. I resent the Rx's to [facility] phamracy in with our name since on last OV SG advised pt to continue on these medications. I will route to my box to follow up on this.    Assessment & Plan Note by Bevelyn Ngo, NP at 11/11/2017 12:13 PM  Author: Bevelyn Ngo, NP Author Type: Nurse Practitioner Filed: 11/11/2017 12:15 PM  Note Status: Written Cosign: Cosign Not Required Encounter Date: 11/11/2017  Problem: COPD with chronic bronchitis and emphysema (HCC)  Editor: Bevelyn Ngo, NP (Nurse Practitioner)    Currently stable Swallow study >> Dysphasia 3 Diet  Plan: CXR today. We will call you with results Ambulatory DME for concentrator filter requested./ and all supplies. DME order for POC ( [employer]) We will walk you today on the portable oxygen Wear your oxygen at 3 L [place] pulsed and 2 L [place] continuous Continue supplemental O2 as needed to maintain SpO2 >88%.- 92% You need to wear your oxygen with exertion. When you are better we will schedule PFT's Continue Brovana, Pulmicort, twice daily DuoNeb as needed for breakthrough shortness of breath. Referral to Pulmonary rehab Follow up with Dr. [name] in 3 months with PFT's  Please contact office for sooner follow up if symptoms do not improve or worsen or seek emergency care

## 2017-11-25 ENCOUNTER — Ambulatory Visit (INDEPENDENT_AMBULATORY_CARE_PROVIDER_SITE_OTHER): Payer: Medicare Other | Admitting: Pulmonary Disease

## 2017-11-25 ENCOUNTER — Telehealth: Payer: Self-pay | Admitting: Internal Medicine

## 2017-11-25 ENCOUNTER — Other Ambulatory Visit: Payer: Self-pay | Admitting: *Deleted

## 2017-11-25 ENCOUNTER — Other Ambulatory Visit: Payer: Self-pay

## 2017-11-25 ENCOUNTER — Encounter: Payer: Self-pay | Admitting: Pulmonary Disease

## 2017-11-25 VITALS — BP 100/60 | Temp 97.1°F | Ht 64.0 in | Wt 161.6 lb

## 2017-11-25 DIAGNOSIS — F1010 Alcohol abuse, uncomplicated: Secondary | ICD-10-CM | POA: Diagnosis not present

## 2017-11-25 DIAGNOSIS — J9622 Acute and chronic respiratory failure with hypercapnia: Secondary | ICD-10-CM

## 2017-11-25 DIAGNOSIS — F17210 Nicotine dependence, cigarettes, uncomplicated: Secondary | ICD-10-CM

## 2017-11-25 DIAGNOSIS — G9340 Encephalopathy, unspecified: Secondary | ICD-10-CM

## 2017-11-25 DIAGNOSIS — J449 Chronic obstructive pulmonary disease, unspecified: Secondary | ICD-10-CM | POA: Diagnosis not present

## 2017-11-25 MED ORDER — APIXABAN 5 MG PO TABS: 5.0000 mg | ORAL_TABLET | Freq: Two times a day (BID) | ORAL | 1 refills | Status: DC

## 2017-11-25 MED ORDER — SPIRONOLACTONE 25 MG PO TABS: 12.5000 mg | ORAL_TABLET | Freq: Every day | ORAL | 0 refills | Status: DC

## 2017-11-25 MED ORDER — ATORVASTATIN CALCIUM 40 MG PO TABS: 40.0000 mg | ORAL_TABLET | Freq: Every day | ORAL | 1 refills | Status: DC

## 2017-11-25 MED ORDER — LACTULOSE 10 GM/15ML PO SOLN: ORAL | 2 refills | Status: DC

## 2017-11-25 MED ORDER — SPIRONOLACTONE 25 MG PO TABS: 12.5000 mg | ORAL_TABLET | Freq: Every day | ORAL | 1 refills | Status: DC

## 2017-11-25 NOTE — Addendum Note (Signed)
Addended by: Adele Schilder on: 11/25/2017 04:12 PM   Modules accepted: Orders

## 2017-11-25 NOTE — Patient Instructions (Signed)
Continue Brovana nebulizers twice a day Continue budesonide nebulizers twice a day Continue albuterol nebulizers as needed every 6 hours for shortness of breath, wheezing  2 L of oxygen continuous at all times >>> To maintain oxygen saturations greater than 88% >>> If saturations do not remain above 88% please give our office a call  Continue CPAP use at night  Follow-up with Dr. [name], keep September/2019  Continue to not smoke, continue to avoid being around anyone.   Follow-up with primary care office to see if they have ever given you a Prevnar 13 (this is a pneumonia vaccine).  If they have please let us know so we can update our records.        Please contact the office if your symptoms worsen or you have concerns that you are not improving.   Thank you for choosing [facility] for your healthcare, and for allowing [place] to partner with you on your healthcare journey. I am thankful to be able to provide care to you today.   Elisha Headland FNP-C

## 2017-11-25 NOTE — Telephone Encounter (Signed)
Age 72 years Wt 73.3kg 11/25/2017 Saw Dr Mclean 11/12/2017 11/19/2017 SrCr 1.20 10/29/2017 Hgb 13.2 HCT 44.1 Refill done for Eliquis 5mg q 12 hours as requested  

## 2017-11-25 NOTE — Telephone Encounter (Signed)
Spoke with pt's daughter, [name]. States that pt is having issues with requiring more oxygen. With 2L/min his oxygen level is running in the 80's. I have scheduled the pt to see Arlys John today at 3:30pm. Nothing further was needed.

## 2017-11-25 NOTE — Progress Notes (Signed)
@Patient  ID: [name], male    DOB: 1946-01-26, 72 y.o.   MRN: [id number]  Chief Complaint  Patient presents with  . Acute Visit    Family needs more education on oxygen and when patient should be wearing. Reports patients oxygen drops everytime he takes it off. 3L pulsed 81%, placed on 3L continous 96%.     Referring provider: Georgann Housekeeper, MD  HPI: [name] is a 72 y.o. male current every day smoker with COPD on home oxygen, suspected OSA/OHS, chronic systolic heart failure, atrial fib,ETOH abuse. CKD stage 3. He is followed by Dr. [name]  Recent [facility] Encounters:   11/11/2017 Hospital Follow Up: Pt. Presents today for hospital follow up. He was admitted to the hospital with Acute on Chronic HF requiring intubation from 5/21-5/23. He had many other compounding medical factors that drove his care. See below for a detailed hospital Course   Tests:  STUDIES:  CXR 11/11/2017>> IMPRESSION: Persistent increased markings at the lung bases, favorscarring/fibrosis. Mild cardiomegaly. CT Chest 5/16 >>Marked cardiomegaly and pulmonary venous congestion. Focal infiltrate in the right base and perhaps the right middle lobe is suggestive of pneumonia or aspiration.The main pulmonary artery is mildly dilated measuring 3.2 cm raising the possibility of pulmonary arterial hypertension  ECHO 5/18 >>Mild LVH. Systolic function was moderately to severely reduced. The estimated ejection fraction was in the range of 30% to 35%. Pulmonary arteries: Systolic pressure was moderately increased. PA peak pressure: 65 mm Hg (S). EEG 5/21 >>moderate diffuse background slowing with occasional triphasic waves (typically seen with hepatic encephalopathy), occasional epileptiform discharges over the left mid-temporal & left occipital regions CT Head 5/21 >>no acute intracranial abnormality. Multiple old bilateral cerebral &cerebellar infarcts unchanged RUQ [place] 5/22 >> IMPRESSION: 1.  Slightly dilated gallbladder which contains small amount of stones and sludge. Wall thickness upper normal. No definitive sonographic evidence for cholecystitis or biliary dilatation 2. Increased hepatic echogenicity which may be secondary to steatosis or hepatocellular disease. No focal hepatic abnormality. CULTURES RVP 5/16 >>negative    11/25/17 Acute  72 year old patient presenting today for continued issues with managing oxygen.  Family unsure if patient needs to be on continuous oxygen or if it is as needed.  When off oxygen patient's oxygen saturations remain in the low 80s.  Family unsure if patient needs to be on oxygen continuously.  Patient also confirms that they did have some confusion over nebulizers and getting that ordered via pharmacy.  Telephone encounters yesterday show that this seems to been resolved.  Patient is adherent to Brovana, budesonide, and albuterol nebulizer treatments.  Family reporting that patient has stopped smoking, but sometimes cigarettes go missing and they are unsure who smokes them.  Also unsure if patient has stopped drinking.  Appears to be lots of confusion on treatment plan as well as if patient needs to be on all of these medications, family also unsure if patient needs oxygen.  Of note patient arrived to office on 3 L pulsed and oxygen saturations were in the mid 80s.  When placed on 2 L continuous patient's oxygen saturations resumed to 91%.    Allergies  Allergen Reactions  . Warfarin Other (See Comments)    Non compliance and ETOH abuse    Immunization History  Administered Date(s) Administered  . Influenza Split 06/15/2012  . Influenza,inj,Quad PF,6+ Mos 03/06/2016  . Pneumococcal Polysaccharide-23 01/06/2015, 03/06/2016   >>> May need Prevnar 13 -family to follow-up with primary care and see if they  have Artie received this.  If not the patient could receive Prevnar 13 at next appointment.   Past Medical History:  Diagnosis  Date  . AICD (automatic cardioverter/defibrillator) present   . Alcohol abuse   . Anxiety   . Bipolar disorder (HCC)   . Biventricular automatic implantable cardioverter defibrillator in situ    a. 01/2014 s/p MDT [id number] Viva XT CRT-D (ser # [id number]).  . CAD (coronary artery disease)    a. s/p prior PCI/stenting of the LCX;  b. 08/2014 MV: EF 20%, large septal, apical, and inferior infarct from apex to base, no ischemia;  c. 08/2014 NSTEMI/Cath: LM mod distal dzs extending into ostial LCX (80%), LAD tortuous, RI nl, OM mod dzs, RCA 100 CTO with R->R and L->R collats-->Med Rx.  . Chronic atrial fibrillation (HCC)    a. CHA2DS2VASc = 6->eliquis;  b. S/P AVN RFCA an BiV ICD placement.  . Chronic respiratory failure (HCC)   . Chronic systolic congestive heart failure (HCC)    a. 01/2014 Echo: EF 25-30%, mod conc LVH, mod dil LA.  . CKD (chronic kidney disease)    a. suspect stage II-III based on historical labs.  . Complete heart block (HCC)    a. In setting of prior AV nodal ablation r/t afib-->BiV ICD (01/2014).  . COPD (chronic obstructive pulmonary disease) (HCC)   . CVA (cerebral vascular accident) (HCC)    a. Multiple prior embolic strokes.  . Dementia   . Depression   . DVT (deep venous thrombosis) (HCC)   . Dyslipidemia   . Hyperglycemia   . Hypertension   . Mitral valve disease    a. remote mitral replacement with Bjork Shiley valve;  b. 06/2006 Redo MVR with tissue valve.  . Mixed Ischemic and Nonischemic Cardiomyopathy    a. 8/.2015 Echo: Ef 25-30%;  b. 01/2014 s/p MDT [id number] Talbot Grumbling XT CRT-D (ser # [id number]).  . On home oxygen therapy    "2L; 24/7" (65/16/2019)  . Psoriasis    "back of his head; elbows; finger of left hand" (06/07/2017)  . Ventral hernia     Tobacco History: Social History   Tobacco Use  Smoking Status Current Every Day Smoker  . Packs/day: 2.00  . Years: 45.00  . Pack years: 90.00  . Types: Cigarettes  Smokeless Tobacco Never Used   Ready to quit:  Yes Counseling given: Yes Patient reports that he has stopped smoking.  Unsure if this is accurate.  Patient family reporting that sometimes cigarettes get missing and they are still actively smoking.  Patient's family says that they smoke outside the house so they do not affect patient's cravings.   Outpatient Encounter Medications as of 11/25/2017  Medication Sig  . arformoterol (BROVANA) 15 MCG/2ML NEBU Take 2 mLs (15 mcg total) by nebulization 2 (two) times daily.  Marland Kitchen arformoterol (BROVANA) 15 MCG/2ML NEBU Take 2 mLs (15 mcg total) by nebulization 2 (two) times daily for 15 days.  Marland Kitchen atorvastatin (LIPITOR) 40 MG tablet Take 1 tablet (40 mg total) by mouth daily at 6 PM.  . budesonide (PULMICORT) 0.5 MG/2ML nebulizer solution Take 2 mLs (0.5 mg total) by nebulization 2 (two) times daily. DX: J44.9  . carvedilol (COREG) 6.25 MG tablet Take 1 tablet (6.25 mg total) by mouth 2 (two) times daily with a meal.  . digoxin (LANOXIN) 0.125 MG tablet Take 1 tablet (0.125 mg total) by mouth daily.  . food thickener (THICK IT) POWD Honey- thick liquids. Follow instructions on container.  Marland Kitchen  glipiZIDE (GLUCOTROL) 5 MG tablet Take 5 mg daily before breakfast by mouth.  Marland Kitchen ipratropium-albuterol (DUONEB) 0.5-2.5 (3) MG/3ML SOLN Take 3 mLs by nebulization every 4 (four) hours as needed.  . lactulose (CHRONULAC) 10 GM/15ML solution TAKE 45 MILLILITERS BY MOUTH TWICE A DAY  . loratadine (CLARITIN) 10 MG tablet Take 1 tablet (10 mg total) by mouth daily.  Marland Kitchen losartan (COZAAR) 25 MG tablet Take 0.5 tablets (12.5 mg total) by mouth daily.  . Multiple Vitamin (MULTIVITAMIN WITH MINERALS) TABS tablet Take 1 tablet by mouth daily.  . nitroGLYCERIN (NITROSTAT) 0.4 MG SL tablet Place 1 tablet (0.4 mg total) under the tongue every 5 (five) minutes as needed for chest pain.  . OXYGEN Inhale 2.5 L into the lungs continuous.   Marland Kitchen senna-docusate (SENOKOT-S) 8.6-50 MG tablet Take 1 tablet by mouth daily. Hold for diarrhea.  .  thiamine 100 MG tablet Take 1 tablet (100 mg total) by mouth daily.  Marland Kitchen torsemide (DEMADEX) 20 MG tablet Take 2 tablets (40 mg total) by mouth daily.  . [DISCONTINUED] apixaban (ELIQUIS) 5 MG TABS tablet Take 1 tablet (5 mg total) by mouth 2 (two) times daily.  . [DISCONTINUED] spironolactone (ALDACTONE) 25 MG tablet Take 0.5 tablets (12.5 mg total) by mouth daily.  . [DISCONTINUED] atorvastatin (LIPITOR) 40 MG tablet Take 1 tablet (40 mg total) by mouth daily. (Patient taking differently: Take 40 mg by mouth daily at 6 PM. )  . [DISCONTINUED] lactulose (CHRONULAC) 10 GM/15ML solution TAKE 45 MILLILITERS BY MOUTH TWICE A DAY  . [DISCONTINUED] spironolactone (ALDACTONE) 25 MG tablet Take 0.5 tablets (12.5 mg total) by mouth daily.   No facility-administered encounter medications on file as of 11/25/2017.      Review of Systems  Constitutional:  +fatigue  No  weight loss, night sweats,  fevers, chills HEENT:  +nasal congestion  No headaches,  Difficulty swallowing,  Tooth/dental problems, or  Sore throat, No sneezing, itching, ear ache CV: No chest pain,  orthopnea, PND, swelling in lower extremities, anasarca, dizziness, palpitations, syncope  GI: No heartburn, indigestion, abdominal pain, nausea, vomiting, diarrhea, change in bowel habits, loss of appetite, bloody stools Resp: + Increased shortness of breath with exertion and at rest, feels he needs oxygen more  No excess mucus, no productive cough,  No non-productive cough,  No coughing up of blood.  No change in color of mucus.  No wheezing.  No chest wall deformity Skin: no rash, lesions, no skin changes. GU: no dysuria, change in color of urine, no urgency or frequency.  No flank pain, no hematuria  MS:  No joint pain or swelling.  No decreased range of motion.  No back pain. Psych:  No change in mood or affect. No depression or anxiety.  No memory loss.   Physical Exam  BP 100/60   Temp (!) 97.1 F (36.2 C) (Oral)   Ht 5\' 4"  (1.626  m)   Wt 161 lb 9.6 oz (73.3 kg)   SpO2 95%   BMI 27.74 kg/m   Wt Readings from Last 3 Encounters:  11/25/17 161 lb 9.6 oz (73.3 kg)  11/12/17 158 lb (71.7 kg)  11/11/17 156 lb 3.2 oz (70.9 kg)     GEN: A/Ox3; pleasant , NAD, chronically ill, on 2L continuous  HEENT:  [place]/AT,  EACs-clear, TMs-wnl, NOSE-clear, THROAT-clear, no lesions, no postnasal drip or exudate noted.  NECK:  Supple w/ fair ROM; no JVD; normal carotid impulses w/o bruits; no thyromegaly or nodules palpated; no lymphadenopathy.  RESP:  Clear  P & A; w/o, diminished airway movement, barrell chest, wheezes/ rales/ or rhonchi. no accessory muscle use, no dullness to percussion CARD:  RRR, no m/r/g, no peripheral edema, pulses intact, no cyanosis or clubbing. GI:   Soft & nt; nml bowel sounds; no organomegaly or masses detected.  Musco: Warm bil, no deformities or joint swelling noted.  Neuro: alert, no focal deficits noted.   Skin: Warm, no lesions or rashes    Lab Results:  CBC    Component Value Date/Time   WBC 11.7 (H) 10/29/2017 0426   RBC 4.94 10/29/2017 0426   HGB 13.2 10/29/2017 0426   HCT 44.1 10/29/2017 0426   PLT 183 10/29/2017 0426   MCV 89.3 10/29/2017 0426   MCH 26.7 10/29/2017 0426   MCHC 29.9 (L) 10/29/2017 0426   RDW 16.7 (H) 10/29/2017 0426   LYMPHSABS 2.0 08/22/2017 2334   MONOABS 0.9 08/22/2017 2334   EOSABS 0.4 08/22/2017 2334   BASOSABS 0.1 08/22/2017 2334    BMET    Component Value Date/Time   NA 136 11/19/2017 0905   NA 140 05/25/2017 1152   K 4.6 11/19/2017 0905   CL 96 (L) 11/19/2017 0905   CO2 33 (H) 11/19/2017 0905   GLUCOSE 350 (H) 11/19/2017 0905   BUN 17 11/19/2017 0905   BUN 19 05/25/2017 1152   CREATININE 1.20 11/19/2017 0905   CALCIUM 9.4 11/19/2017 0905   GFRNONAA 60 (L) 11/19/2017 0905   GFRAA >60 11/19/2017 0905    BNP    Component Value Date/Time   BNP 456.6 (H) 10/21/2017 1842    ProBNP    Component Value Date/Time   PROBNP 1,108 (H) 05/25/2017  1152   PROBNP 575.8 (H) 02/28/2014 1417    Imaging: Dg Chest 2 View  Result Date: 11/11/2017 CLINICAL DATA:  Follow-up pneumonia EXAM: CHEST - 2 VIEW COMPARISON:  10/28/2017 FINDINGS: Cardiomegaly. Prior median sternotomy and valve replacement. Left AICD remains in place, unchanged. Interval removal of endotracheal tube and NG tube. Persistent linear bibasilar opacities, favor scarring. Calcified pleural plaques noted at the left base with left pleural thickening/scarring. No visible acute infiltrates or effusions. No acute bony abnormality. IMPRESSION: Persistent increased markings at the lung bases, favor scarring/fibrosis. Mild cardiomegaly. Electronically Signed   By: Charlett Nose M.D.   On: 11/11/2017 10:50   Dg Chest Port 1 View  Result Date: 10/28/2017 CLINICAL DATA:  Item endotracheal tube, acute respiratory failure EXAM: PORTABLE CHEST 1 VIEW COMPARISON:  Chest x-ray of 10/26/2017 FINDINGS: The tip of the endotracheal tube is approximately 3.6 cm above the carina. Aeration has improved with decreasing basilar atelectasis. Pacer and AICD leads remain and right central venous line tip overlies the right atrium. Cardiomegaly is stable. IMPRESSION: 1. Improved aeration with decreasing basilar atelectasis. 2. Endotracheal tube tip 3.6 cm above the carina. 3. Stable cardiomegaly with pacer and AICD leads. Electronically Signed   By: Dwyane Dee M.D.   On: 10/28/2017 09:29   Dg Swallowing Func-speech Pathology  Result Date: 10/31/2017 Objective Swallowing Evaluation: Type of Study: MBS-Modified Barium Swallow Study  Patient Details Name: KINSLER SOEDER MRN: [id number] Date of Birth: [birth year] Today's Date: 10/31/2017 Time: SLP Start Time (ACUTE ONLY): 1020 -SLP Stop Time (ACUTE ONLY): 1045 SLP Time Calculation (min) (ACUTE ONLY): 25 min Past Medical History: Past Medical History: Diagnosis Date . AICD (automatic cardioverter/defibrillator) present  . Alcohol abuse  . Anxiety  . Bipolar disorder (HCC)   . Biventricular automatic implantable cardioverter defibrillator  in situ   a. 01/2014 s/p MDT [id number] Coletta Memos CRT-D (ser # [id number]). . CAD (coronary artery disease)   a. s/p prior PCI/stenting of the LCX;  b. 08/2014 MV: EF 20%, large septal, apical, and inferior infarct from apex to base, no ischemia;  c. 08/2014 NSTEMI/Cath: LM mod distal dzs extending into ostial LCX (80%), LAD tortuous, RI nl, OM mod dzs, RCA 100 CTO with R->R and L->R collats-->Med Rx. . Chronic atrial fibrillation (HCC)   a. CHA2DS2VASc = 6->eliquis;  b. S/P AVN RFCA an BiV ICD placement. . Chronic respiratory failure (HCC)  . Chronic systolic congestive heart failure (HCC)   a. 01/2014 Echo: EF 25-30%, mod conc LVH, mod dil LA. . CKD (chronic kidney disease)   a. suspect stage II-III based on historical labs. . Complete heart block (HCC)   a. In setting of prior AV nodal ablation r/t afib-->BiV ICD (01/2014). . COPD (chronic obstructive pulmonary disease) (HCC)  . CVA (cerebral vascular accident) (HCC)   a. Multiple prior embolic strokes. . Dementia  . Depression  . DVT (deep venous thrombosis) (HCC)  . Dyslipidemia  . Hyperglycemia  . Hypertension  . Mitral valve disease   a. remote mitral replacement with Bjork Shiley valve;  b. 06/2006 Redo MVR with tissue valve. . Mixed Ischemic and Nonischemic Cardiomyopathy   a. 8/.2015 Echo: Ef 25-30%;  b. 01/2014 s/p MDT [id number] Talbot Grumbling XT CRT-D (ser # [id number]). . On home oxygen therapy   "2L; 24/7" (65/16/2019) . Psoriasis   "back of his head; elbows; finger of left hand" (06/07/2017) . Ventral hernia  Past Surgical History: Past Surgical History: Procedure Laterality Date . AV NODE ABLATION  2007 . CARDIAC CATHETERIZATION  06/2006  Mild ostial L main stenosis, CFX stent patent, RCA occluded (old) . CORONARY ARTERY BYPASS GRAFT  2008 . CYSTOSCOPY W/ URETERAL STENT PLACEMENT  07/12/2011  Procedure: CYSTOSCOPY WITH RETROGRADE PYELOGRAM/URETERAL STENT PLACEMENT;  Surgeon: Sebastian Ache, MD;  Location: [facility] OR;   Service: Urology;  Laterality: Left; . HERNIA REPAIR   . ICD GENERATOR CHANGE  02/01/2014  Gen change to: Medtronic VIVA pulse generator, serial number [id number] . INSERT / REPLACE / REMOVE PACEMAKER   . LEFT HEART CATHETERIZATION WITH CORONARY ANGIOGRAM N/A 08/23/2014  Procedure: LEFT HEART CATHETERIZATION WITH CORONARY ANGIOGRAM;  Surgeon: Corky Crafts, MD;  Location: [facility] CATH LAB;  Service: Cardiovascular;  Laterality: N/A; . MITRAL VALVE REPLACEMENT    remote [facility] valve 6301 with redo tissue valve 06/2006 . PACEMAKER GENERATOR CHANGE N/A 02/01/2014  Procedure: PACEMAKER GENERATOR CHANGE;  Surgeon: Duke Salvia, MD;  Location: [facility] CATH LAB;  Service: Cardiovascular;  Laterality: N/A; . PACEMAKER PLACEMENT  2006  Changed to CRT-D in 2009 . RIGHT/LEFT HEART CATH AND CORONARY ANGIOGRAPHY N/A 06/09/2017  Procedure: RIGHT/LEFT HEART CATH AND CORONARY ANGIOGRAPHY;  Surgeon: [name], MD;  Location: [facility] INVASIVE CV LAB;  Service: Cardiovascular;  Laterality: N/A; HPI: 72 y.o. male sent to ER for decreased appetite, increased fatigue and failure to thrive with generalized weakness. PMH includes: chronic combined HF, COPD, a fib, pacemaker placement, mitral valve replacement CAD, CVA, chronic respiratory failure, Dementia, on home oxygen. Pt had a BSE in 2013 recommending regular diet and thin liquids. In 2017 he was on Dys 3 diet and nectar thick liquids due to suspected respiratory-based dysphagia, although he was able to d/c home on soft solids and thin liquids. Normal MBS in 2002.  Subjective: alert, pleasantly confused Assessment / Plan / Recommendation CHL IP  CLINICAL IMPRESSIONS 10/31/2017 Clinical Impression Patient presents with moderate oropharyngeal dyphagia and aspiration of thin, nectar thick liquids due to delayed swallow initiation. Oral stage noted for piecemealing with liquids. Thin and nectar thick liquids spill into pt's open airway and are aspirated before the swallow. Moderate amount  of aspiration was sensed, however pt does not sense trace aspiration events. Tongue base retraction mildly decreased, resulting in base of tongue, vallecular residue with thicker consistencies and solids. Clears with cued dry swallow, though pt is inconsistent in following commands for this maneuver. Chin tuck was typically effective in reducing penetration events with nectar, however pt again had difficulty following commands to complete this reliably, and it did not prevent penetration with straw sips or larger amounts of nectar. As pt was exhibiting questionable signs of aspiration prior to intubation, I suspect sensory component of his dysphagia may be related at least in part to dementia vs intubation, though certainly this may have exacerbated his issues acutely. Given his respiratory decline requiring intubation 5/21, would proceed with conservative diet to minimize aspiration risks during acute illness, with dys 3, honey thick liquids. If he is able to use a chin tuck reliably, would advance to nectar thick liquids. Consider repeat MBS with additional time post-extubation. SLP Visit Diagnosis Dysphagia, oropharyngeal phase (R13.12) Attention and concentration deficit following -- Frontal lobe and executive function deficit following -- Impact on safety and function Moderate aspiration risk   CHL IP TREATMENT RECOMMENDATION 10/31/2017 Treatment Recommendations Therapy as outlined in treatment plan below   Prognosis 10/31/2017 Prognosis for Safe Diet Advancement (No Data) Barriers to Reach Goals Cognitive deficits Barriers/Prognosis Comment -- CHL IP DIET RECOMMENDATION 10/31/2017 SLP Diet Recommendations Dysphagia 3 (Mech soft) solids;Honey thick liquids Liquid Administration via Cup Medication Administration Crushed with puree Compensations Minimize environmental distractions;Slow rate;Small sips/bites;Multiple dry swallows after each bite/sip Postural Changes Seated upright at 90 degrees   CHL IP OTHER  RECOMMENDATIONS 10/31/2017 Recommended Consults -- Oral Care Recommendations Oral care BID Other Recommendations Order thickener from pharmacy;Remove water pitcher;Prohibited food (jello, ice cream, thin soups)   CHL IP FOLLOW UP RECOMMENDATIONS 10/31/2017 Follow up Recommendations Other (comment)   CHL IP FREQUENCY AND DURATION 10/31/2017 Speech Therapy Frequency (ACUTE ONLY) min 2x/week Treatment Duration 2 weeks      CHL IP ORAL PHASE 10/31/2017 Oral Phase Impaired Oral - Pudding Teaspoon -- Oral - Pudding Cup -- Oral - Honey Teaspoon Lingual/palatal residue;Piecemeal swallowing Oral - Honey Cup Lingual/palatal residue;Piecemeal swallowing Oral - Nectar Teaspoon -- Oral - Nectar Cup Lingual/palatal residue;Piecemeal swallowing Oral - Nectar Straw Lingual/palatal residue;Piecemeal swallowing Oral - Thin Teaspoon Lingual/palatal residue;Piecemeal swallowing Oral - Thin Cup Lingual/palatal residue;Piecemeal swallowing Oral - Thin Straw -- Oral - Puree Lingual/palatal residue Oral - Mech Soft -- Oral - Regular Lingual/palatal residue Oral - Multi-Consistency -- Oral - Pill (No Data) Oral Phase - Comment --  CHL IP PHARYNGEAL PHASE 10/31/2017 Pharyngeal Phase Impaired Pharyngeal- Pudding Teaspoon -- Pharyngeal -- Pharyngeal- Pudding Cup -- Pharyngeal -- Pharyngeal- Honey Teaspoon Reduced tongue base retraction;Pharyngeal residue - valleculae;Delayed swallow initiation-vallecula Pharyngeal -- Pharyngeal- Honey Cup Reduced tongue base retraction;Pharyngeal residue - valleculae;Delayed swallow initiation-vallecula Pharyngeal -- Pharyngeal- Nectar Teaspoon Delayed swallow initiation-pyriform sinuses;Penetration/Aspiration before swallow;Penetration/Aspiration during swallow;Trace aspiration;Pharyngeal residue - valleculae Pharyngeal Material enters airway, remains ABOVE vocal cords and not ejected out Pharyngeal- Nectar Cup Delayed swallow initiation-pyriform sinuses;Penetration/Aspiration before swallow;Penetration/Aspiration  during swallow;Trace aspiration Pharyngeal Material enters airway, passes BELOW cords without attempt by patient to eject out (silent aspiration);Material enters airway, remains ABOVE vocal cords  and not ejected out Pharyngeal- Nectar Straw Delayed swallow initiation-pyriform sinuses;Reduced tongue base retraction;Penetration/Aspiration before swallow;Penetration/Aspiration during swallow;Trace aspiration;Pharyngeal residue - valleculae Pharyngeal -- Pharyngeal- Thin Teaspoon Delayed swallow initiation-pyriform sinuses;Penetration/Aspiration before swallow;Trace aspiration Pharyngeal Material enters airway, passes BELOW cords without attempt by patient to eject out (silent aspiration) Pharyngeal- Thin Cup Delayed swallow initiation-pyriform sinuses;Reduced tongue base retraction;Penetration/Aspiration before swallow;Moderate aspiration;Pharyngeal residue - valleculae Pharyngeal Material enters airway, passes BELOW cords and not ejected out despite cough attempt by patient Pharyngeal- Thin Straw -- Pharyngeal -- Pharyngeal- Puree Delayed swallow initiation-vallecula;Reduced tongue base retraction;Pharyngeal residue - valleculae Pharyngeal -- Pharyngeal- Mechanical Soft -- Pharyngeal -- Pharyngeal- Regular Delayed swallow initiation-vallecula;Reduced tongue base retraction;Pharyngeal residue - valleculae Pharyngeal -- Pharyngeal- Multi-consistency -- Pharyngeal -- Pharyngeal- Pill Delayed swallow initiation-vallecula;Reduced tongue base retraction;Pharyngeal residue - valleculae Pharyngeal -- Pharyngeal Comment --  CHL IP CERVICAL ESOPHAGEAL PHASE 10/31/2017 Cervical Esophageal Phase WFL Pudding Teaspoon -- Pudding Cup -- Honey Teaspoon -- Honey Cup -- Nectar Teaspoon -- Nectar Cup -- Nectar Straw -- Thin Teaspoon -- Thin Cup -- Thin Straw -- Puree -- Mechanical Soft -- Regular -- Multi-consistency -- Pill -- Cervical Esophageal Comment -- Rondel Baton, MS, CCC-SLP Speech-Language Pathologist [phone] No flowsheet  data found. Arlana Lindau 10/31/2017, 11:46 AM              US Abdomen Limited Ruq  Result Date: 10/28/2017 CLINICAL DATA:  Cirrhosis EXAM: ULTRASOUND ABDOMEN LIMITED RIGHT UPPER QUADRANT COMPARISON:  08/23/2017 CT FINDINGS: Gallbladder: Slightly dilated gallbladder with small amount of sludge and stones. Negative sonographic Murphy. Upper normal wall thickness at 3.2 mm Common bile duct: Diameter: 4.2 mm Liver: Increased echogenicity. No focal hepatic abnormality. Limited visualization due to positioning. Portal vein is patent on color Doppler imaging with normal direction of blood flow towards the liver. IMPRESSION: 1. Slightly dilated gallbladder which contains small amount of stones and sludge. Wall thickness upper normal. No definitive sonographic evidence for cholecystitis or biliary dilatation 2. Increased hepatic echogenicity which may be secondary to steatosis or hepatocellular disease. No focal hepatic abnormality. Electronically Signed   By: Jasmine Pang M.D.   On: 10/28/2017 02:56     Assessment & Plan:   Pleasant 72 year old man seen office today.  Unfortunately there is lots of confusion over oxygen requirements, when to wear oxygen, as well as nebulizer medications.  After extensive discussion with patient as well as family I reminded them that they are supposed to be on 2 L of oxygen continuously.  This is why his oxygen saturations remain above 88% when on oxygen, and drop when off.  When using pulsed oxygen via his POC we may need to go up higher such as 3 L or 4 L pulse in order to make sure oxygen saturations are greater than 88%.  Family to check oxygen saturations at home and ensure that there are maintaining greater than 88% if they are having issues with this they will call our office and let us know.   Continue nebulizers as ordered.   Acute on chronic respiratory failure with hypercapnia (HCC)  2 L of oxygen continuously Maintain oxygen saturations greater than  88% Follow-up with our office in September/2019 appointment with Dr. [name] Continue not smoking  COPD with chronic bronchitis and emphysema (HCC) Continue Brovana nebulizers twice a day Continue budesonide nebulizers twice a day Continue albuterol nebulizers as needed every 6 hours for shortness of breath, wheezing  2 L of oxygen continuous at all times >>> To maintain oxygen saturations greater than  88% >>> If saturations do not remain above 88% please give our office a call  Continue CPAP use at night  Follow-up with Dr. [name], keep September/2019  Continue to not smoke, continue to avoid being around anyone.   Follow-up with primary care office to see if they have ever given you a Prevnar 13 (this is a pneumonia vaccine).  If they have please let us know so we can update our records.   Alcohol abuse Discussed extensively the importance of avoiding alcohol use  Cigarette smoker Emphasized the importance of continuing to not smoke Do not smoke any cigarettes when found out inside your home Avoid being around family members or friends who are smoking Encourage family and friends to continue to smoke outside and away from you  Acute encephalopathy Remain adherent to lactulose     Coral Ceo, NP 11/25/2017

## 2017-11-25 NOTE — Addendum Note (Signed)
Addended by: Jeannine Kitten on: 11/25/2017 03:15 PM   Modules accepted: Orders

## 2017-11-25 NOTE — Assessment & Plan Note (Signed)
Remain adherent to lactulose

## 2017-11-25 NOTE — Telephone Encounter (Addendum)
These medications was sent in with Dr. Lubertha Basque name on it. It was cancelled and i'm sending this to you for Dr. Shirlee Latch to refill for the pt. The medications is Lactulose 10 GM/15 ML and atorvastatin 40 mg tablet. Please address

## 2017-11-25 NOTE — Telephone Encounter (Signed)
Age 72 years Wt 73.3kg 11/25/2017 Saw Dr Shirlee Latch 11/12/2017 11/19/2017 SrCr 1.20 10/29/2017 Hgb 13.2 HCT 44.1 Refill done for Eliquis 5mg  q 12 hours as requested

## 2017-11-25 NOTE — Telephone Encounter (Signed)
I have checked SG's lookout and folder up front, it does not appear that forms have been received. ATC [facility]- pharmacy is currently closed. Will call back.

## 2017-11-25 NOTE — Telephone Encounter (Signed)
pillpack req refill please address. Thank you.

## 2017-11-25 NOTE — Assessment & Plan Note (Signed)
Continue Brovana nebulizers twice a day Continue budesonide nebulizers twice a day Continue albuterol nebulizers as needed every 6 hours for shortness of breath, wheezing  2 L of oxygen continuous at all times >>> To maintain oxygen saturations greater than 88% >>> If saturations do not remain above 88% please give our office a call  Continue CPAP use at night  Follow-up with Dr. [name], keep September/2019  Continue to not smoke, continue to avoid being around anyone.   Follow-up with primary care office to see if they have ever given you a Prevnar 13 (this is a pneumonia vaccine).  If they have please let us know so we can update our records.

## 2017-11-25 NOTE — Assessment & Plan Note (Signed)
Discussed extensively the importance of avoiding alcohol use

## 2017-11-25 NOTE — Telephone Encounter (Signed)
Spoke with caregiver and she stated [facility] had not contacted her about the nebulizer medications. She states we can hold off on this for a while because he may be going to the hospital. The nurse recommended that he go to the hospital after her today. The caregiver stated she would call me if she needs our help but she has to find out what is going on with pt.

## 2017-11-25 NOTE — Assessment & Plan Note (Signed)
2 L of oxygen continuously Maintain oxygen saturations greater than 88% Follow-up with our office in September/2019 appointment with Dr. [name] Continue not smoking

## 2017-11-25 NOTE — Assessment & Plan Note (Signed)
Emphasized the importance of continuing to not smoke Do not smoke any cigarettes when found out inside your home Avoid being around family members or friends who are smoking Encourage family and friends to continue to smoke outside and away from you

## 2017-11-25 NOTE — Addendum Note (Signed)
Addended by: Adele Schilder on: 11/25/2017 03:38 PM   Modules accepted: Orders

## 2017-11-26 ENCOUNTER — Telehealth (HOSPITAL_COMMUNITY): Payer: Self-pay

## 2017-11-26 ENCOUNTER — Telehealth: Payer: Self-pay | Admitting: Pulmonary Disease

## 2017-11-26 ENCOUNTER — Other Ambulatory Visit (HOSPITAL_COMMUNITY): Payer: Self-pay | Admitting: *Deleted

## 2017-11-26 MED ORDER — LOSARTAN POTASSIUM 25 MG PO TABS: 12.5000 mg | ORAL_TABLET | Freq: Every day | ORAL | 3 refills | Status: DC

## 2017-11-26 MED ORDER — TORSEMIDE 20 MG PO TABS: 40.0000 mg | ORAL_TABLET | Freq: Every day | ORAL | 3 refills | Status: DC

## 2017-11-26 MED ORDER — DIGOXIN 125 MCG PO TABS: 0.1250 mg | ORAL_TABLET | Freq: Every day | ORAL | 3 refills | Status: DC

## 2017-11-26 MED ORDER — CARVEDILOL 6.25 MG PO TABS: 6.2500 mg | ORAL_TABLET | Freq: Two times a day (BID) | ORAL | 3 refills | Status: DC

## 2017-11-26 MED ORDER — NITROGLYCERIN 0.4 MG SL SUBL: 0.4000 mg | SUBLINGUAL_TABLET | SUBLINGUAL | 2 refills | Status: DC | PRN

## 2017-11-26 NOTE — Progress Notes (Signed)
Chart and office note reviewed in detail  > agree with a/p as outlined    

## 2017-11-26 NOTE — Telephone Encounter (Signed)
Called and spoke with daughter on behalf of patient in regards to Pulmonary Rehab. Patient is interested in the program. Scheduled orientation on 12/27/2017 at 1:30pm. Patient will attend the 1:30pm exc class. Spoke with daughter in regards to insurance, and verbalized understanding. Mailed packet.

## 2017-11-26 NOTE — Telephone Encounter (Signed)
Called and spoke to [place] with [facility].  Barbara Cower stated that pt will need to switch out POC for home fill system, due to POC not accommodating 2L cont.  lmtcb x1 for pt. Per Barbara Cower pt will need to contact the delivery department at ext [phone].

## 2017-11-26 NOTE — Telephone Encounter (Signed)
lmtcb x1 for pt's daughter, [name].  

## 2017-11-26 NOTE — Telephone Encounter (Signed)
[name],daughter,returned call. CB is [phone].

## 2017-11-29 NOTE — Telephone Encounter (Signed)
Called and spoke with patients daughter, advised her of [name] message. Patients daughter was upset and wanted to speak with [name]. After speaking with [name] he suggested that the patient could stay on the 4L pulse with the simply go mini as long as he maintained his o2 above 88%. He advised that the patient needed to have his o2 adequately monitored if this is what he is going to do. Patients daughter then stated that they would just go back on the tanks.   Called and spoke with [name] advised him of the message above. [name] stated that they are going to go pick up his simply go mini and switching it out to the tanks. Patients daughter is aware. Nothing further needed.

## 2017-11-29 NOTE — Telephone Encounter (Signed)
Pt's daughter [name] is calling back    [phone]

## 2017-11-29 NOTE — Telephone Encounter (Signed)
Left message for [name] to call back. 

## 2017-11-30 ENCOUNTER — Telehealth: Payer: Self-pay | Admitting: Pulmonary Disease

## 2017-11-30 NOTE — Telephone Encounter (Signed)
This was taken care of at the OV with [facility] last week. Will close encounter.

## 2017-11-30 NOTE — Telephone Encounter (Signed)
I just spoke with Barbara Cower at [facility] and he is getting the team that delivers to call the daughter about delivery

## 2017-12-01 ENCOUNTER — Telehealth: Payer: Self-pay | Admitting: Internal Medicine

## 2017-12-01 NOTE — Telephone Encounter (Signed)
[employer] and spoke with Lorre Nick egarding needing clarification on [place] [facility].    Verified the instructions on the script and stated to her pt is to continue taking Brovana bid and the 15 day part can be taken off.  Lorre Nick stated she was going to addend the current script and nothing else is needed.

## 2017-12-02 NOTE — Telephone Encounter (Signed)
Attempted to call pt to see if his oxygen has been delivered. I did not receive an answer. I have left a message for pt to return our call.

## 2017-12-03 ENCOUNTER — Ambulatory Visit (HOSPITAL_BASED_OUTPATIENT_CLINIC_OR_DEPARTMENT_OTHER): Payer: Medicare Other | Attending: Internal Medicine | Admitting: Pulmonary Disease

## 2017-12-03 DIAGNOSIS — G4736 Sleep related hypoventilation in conditions classified elsewhere: Secondary | ICD-10-CM | POA: Insufficient documentation

## 2017-12-03 DIAGNOSIS — J9611 Chronic respiratory failure with hypoxia: Secondary | ICD-10-CM | POA: Diagnosis not present

## 2017-12-03 DIAGNOSIS — Z9981 Dependence on supplemental oxygen: Secondary | ICD-10-CM | POA: Insufficient documentation

## 2017-12-03 DIAGNOSIS — J449 Chronic obstructive pulmonary disease, unspecified: Secondary | ICD-10-CM | POA: Diagnosis not present

## 2017-12-03 DIAGNOSIS — J9612 Chronic respiratory failure with hypercapnia: Secondary | ICD-10-CM | POA: Diagnosis not present

## 2017-12-03 DIAGNOSIS — R0689 Other abnormalities of breathing: Secondary | ICD-10-CM

## 2017-12-03 NOTE — Telephone Encounter (Signed)
Called [name]; pt's O2 was delivered/set up. Nothing more needed at this time.

## 2017-12-07 ENCOUNTER — Telehealth: Payer: Self-pay | Admitting: Internal Medicine

## 2017-12-07 NOTE — Telephone Encounter (Signed)
Attempted to call patient today regarding rx refill for budesonide 0.34ml nebulizer solution. This script was filled on 11/24/17 for for pt to take twice daily PRN. I did not receive an answer at time of call. I have left a voicemail message for pt to return call. X1

## 2017-12-07 NOTE — Telephone Encounter (Signed)
She states she does not wish to wait.  She wants to speak with Dr. [name] personally, not a nurse, or a nurse practitioner.

## 2017-12-07 NOTE — Telephone Encounter (Signed)
Patient daughter [name] [phone]) returned phone call

## 2017-12-07 NOTE — Telephone Encounter (Signed)
Called pt's daughter [name] stating to her that MW was out of the office this afternoon and would be back tomorrow, 7/3. [name] wanted to clarify with me that it would indeed take 2 weeks before we had the sleep study results back, and I stated to her that that was correct.  [name] stated that whenO2 was removed from pt, pt desated quickly off of the O2 but then placed back on o2 and pt's sats immediately came up.  [name] states portable O2 was taken from pt and pt is now using the tanks due to amount of O2 pt is having to use.  [name] is wanting to know if there is a POC that could be available that is continuous flow due to the tanks running out so quickly. Stated to her that the POCs do come in a pulse flow. [name] stated she has sen continuous POCs online.  I stated to [name] that the ones online will probably have to be paid for out of pocket.  [name] expressed understanding. I asked her if she still wanted a call back from [facility] and she stated to me she would wait until upcoming OV with MW in September.

## 2017-12-08 DIAGNOSIS — J449 Chronic obstructive pulmonary disease, unspecified: Secondary | ICD-10-CM | POA: Diagnosis not present

## 2017-12-08 DIAGNOSIS — I259 Chronic ischemic heart disease, unspecified: Secondary | ICD-10-CM | POA: Diagnosis not present

## 2017-12-08 MED ORDER — BUDESONIDE 0.5 MG/2ML IN SUSP: 0.5000 mg | Freq: Two times a day (BID) | RESPIRATORY_TRACT | 5 refills | Status: DC

## 2017-12-08 NOTE — Telephone Encounter (Signed)
Called and spoke to pt's daughter, [name] (DPR).  [name] is requesting Rx for Pulmicort be sent to [facility].  Rx has been sent to preferred pharmacy. Nothing further is needed.

## 2017-12-10 ENCOUNTER — Other Ambulatory Visit: Payer: Self-pay | Admitting: *Deleted

## 2017-12-10 NOTE — Telephone Encounter (Signed)
Per fax from [facility], patient is requesting a larger supply of lactulose as it is currently only ordered as a three day supply. Thanks, MI

## 2017-12-11 ENCOUNTER — Other Ambulatory Visit (HOSPITAL_COMMUNITY): Payer: Self-pay | Admitting: Cardiology

## 2017-12-11 DIAGNOSIS — J9611 Chronic respiratory failure with hypoxia: Secondary | ICD-10-CM

## 2017-12-11 DIAGNOSIS — G4736 Sleep related hypoventilation in conditions classified elsewhere: Secondary | ICD-10-CM

## 2017-12-11 DIAGNOSIS — J9612 Chronic respiratory failure with hypercapnia: Secondary | ICD-10-CM | POA: Diagnosis not present

## 2017-12-11 NOTE — Procedures (Signed)
   Patient Name: [name], [name] Date: 12/03/2017 Gender: Male D.O.B: 1945-09-05 Age (years): 61 Referring Provider: Nyoka Cowden Height (inches): 60 Interpreting Physician: Coralyn Helling MD, ABSM Weight (lbs): 183 RPSGT: Rolene Arbour BMI: 36 MRN: [id number] Neck Size: 16.50 <br> <br> CLINICAL INFORMATION Sleep Study Type: NPSG    Indication for sleep study: 72 yr old with history of COPD on home oxygen, and concerns for sleep apnea.    SLEEP STUDY TECHNIQUE As per the AASM Manual for the Scoring of Sleep and Associated Events v2.3 (April 2016) with a hypopnea requiring 4% desaturations.  The channels recorded and monitored were frontal, central and occipital EEG, electrooculogram (EOG), submentalis EMG (chin), nasal and oral airflow, thoracic and abdominal wall motion, anterior tibialis EMG, snore microphone, electrocardiogram, and pulse oximetry.  MEDICATIONS Medications self-administered by patient taken the night of the study : N/A  SLEEP ARCHITECTURE The study was initiated at 9:47:59 PM and ended at 4:28:18 AM.  Sleep onset time was 21.7 minutes and the sleep efficiency was 64.3%%. The total sleep time was 257.5 minutes.  Stage REM latency was 95.0 minutes.  The patient spent 6.6%% of the night in stage N1 sleep, 90.7%% in stage N2 sleep, 0.0%% in stage N3 and 2.72% in REM.  Alpha intrusion was absent.  Supine sleep was 74.41%.  RESPIRATORY PARAMETERS The overall apnea/hypopnea index (AHI) was 0.7 per hour. There were 0 total apneas, including 0 obstructive, 0 central and 0 mixed apneas. There were 3 hypopneas and 37 RERAs.  The AHI during Stage REM sleep was 0.0 per hour.  AHI while supine was 0.9 per hour.  The mean oxygen saturation was 94.1%. The minimum SpO2 during sleep was 89.0%.  The study was conducted with him using his baseline 2 liters oxygen.  soft snoring was noted during this study.  CARDIAC DATA The 2 lead EKG demonstrated  sinus rhythm. The mean heart rate was 60.7 beats per minute. Other EKG findings include: None. LEG MOVEMENT DATA The total PLMS were 0 with a resulting PLMS index of 0.0.  IMPRESSIONS - While he had a few obstructive respiratory events, these were not frequent enough to meet the diagnostic criteria for obstructive sleep apnea.  His AHI was 0.7. - His SpO2 low was 89%.  He wore 2 liters supplemental oxygen during the study. - The patient snored with soft snoring volume. 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

## 2017-12-13 ENCOUNTER — Telehealth: Payer: Self-pay | Admitting: Internal Medicine

## 2017-12-13 ENCOUNTER — Encounter: Payer: Self-pay | Admitting: Internal Medicine

## 2017-12-13 ENCOUNTER — Ambulatory Visit (INDEPENDENT_AMBULATORY_CARE_PROVIDER_SITE_OTHER): Payer: Medicare Other | Admitting: Internal Medicine

## 2017-12-13 ENCOUNTER — Ambulatory Visit (HOSPITAL_COMMUNITY)
Admission: RE | Admit: 2017-12-13 | Discharge: 2017-12-13 | Disposition: A | Discharge status: Home / Self Care | Payer: Medicare Other | Source: Ambulatory Visit | Attending: Cardiology | Admitting: Cardiology

## 2017-12-13 ENCOUNTER — Ambulatory Visit (INDEPENDENT_AMBULATORY_CARE_PROVIDER_SITE_OTHER)
Admission: RE | Admit: 2017-12-13 | Discharge: 2017-12-13 | Disposition: A | Discharge status: Home / Self Care | Payer: Medicare Other | Source: Ambulatory Visit | Attending: Internal Medicine | Admitting: Internal Medicine

## 2017-12-13 VITALS — BP 118/64 | HR 72 | Wt 165.0 lb

## 2017-12-13 VITALS — BP 142/80 | HR 74 | Ht 60.0 in | Wt 163.0 lb

## 2017-12-13 DIAGNOSIS — Z952 Presence of prosthetic heart valve: Secondary | ICD-10-CM | POA: Insufficient documentation

## 2017-12-13 DIAGNOSIS — I5022 Chronic systolic (congestive) heart failure: Secondary | ICD-10-CM | POA: Diagnosis not present

## 2017-12-13 DIAGNOSIS — I2510 Atherosclerotic heart disease of native coronary artery without angina pectoris: Secondary | ICD-10-CM | POA: Insufficient documentation

## 2017-12-13 DIAGNOSIS — F17210 Nicotine dependence, cigarettes, uncomplicated: Secondary | ICD-10-CM

## 2017-12-13 DIAGNOSIS — J9612 Chronic respiratory failure with hypercapnia: Secondary | ICD-10-CM

## 2017-12-13 DIAGNOSIS — Z87891 Personal history of nicotine dependence: Secondary | ICD-10-CM | POA: Diagnosis not present

## 2017-12-13 DIAGNOSIS — F1010 Alcohol abuse, uncomplicated: Secondary | ICD-10-CM | POA: Diagnosis not present

## 2017-12-13 DIAGNOSIS — Z7901 Long term (current) use of anticoagulants: Secondary | ICD-10-CM | POA: Diagnosis not present

## 2017-12-13 DIAGNOSIS — I255 Ischemic cardiomyopathy: Secondary | ICD-10-CM | POA: Diagnosis not present

## 2017-12-13 DIAGNOSIS — J9610 Chronic respiratory failure, unspecified whether with hypoxia or hypercapnia: Secondary | ICD-10-CM | POA: Diagnosis not present

## 2017-12-13 DIAGNOSIS — G4733 Obstructive sleep apnea (adult) (pediatric): Secondary | ICD-10-CM | POA: Insufficient documentation

## 2017-12-13 DIAGNOSIS — J9611 Chronic respiratory failure with hypoxia: Secondary | ICD-10-CM

## 2017-12-13 DIAGNOSIS — I5023 Acute on chronic systolic (congestive) heart failure: Secondary | ICD-10-CM | POA: Diagnosis not present

## 2017-12-13 DIAGNOSIS — J449 Chronic obstructive pulmonary disease, unspecified: Secondary | ICD-10-CM | POA: Diagnosis not present

## 2017-12-13 DIAGNOSIS — K7460 Unspecified cirrhosis of liver: Secondary | ICD-10-CM | POA: Diagnosis not present

## 2017-12-13 DIAGNOSIS — I739 Peripheral vascular disease, unspecified: Secondary | ICD-10-CM | POA: Diagnosis not present

## 2017-12-13 DIAGNOSIS — R413 Other amnesia: Secondary | ICD-10-CM | POA: Diagnosis not present

## 2017-12-13 DIAGNOSIS — I482 Chronic atrial fibrillation: Secondary | ICD-10-CM | POA: Diagnosis not present

## 2017-12-13 DIAGNOSIS — E1165 Type 2 diabetes mellitus with hyperglycemia: Secondary | ICD-10-CM | POA: Diagnosis not present

## 2017-12-13 LAB — BASIC METABOLIC PANEL
ANION GAP: 4 — AB (ref 5–15)
BUN: 24 mg/dL — ABNORMAL HIGH (ref 8–23)
BUN: 26 — AB (ref 4–21)
CO2: 40 mmol/L — ABNORMAL HIGH (ref 22–32)
Calcium: 9.2 mg/dL (ref 8.9–10.3)
Chloride: 96 mmol/L — ABNORMAL LOW (ref 98–111)
Creatinine, Ser: 1.42 mg/dL — ABNORMAL HIGH (ref 0.61–1.24)
Creatinine: 1.2 (ref 0.6–1.3)
GFR calc Af Amer: 56 mL/min — ABNORMAL LOW (ref >60)
GFR, EST NON AFRICAN AMERICAN: 49 mL/min — AB (ref >60)
GLUCOSE: 330 mg/dL — AB (ref 70–99)
Glucose: 220
POTASSIUM: 5.6 mmol/L — AB (ref 3.5–5.1)
Potassium: 4.5 (ref 3.4–5.3)
SODIUM: 141 (ref 137–147)
Sodium: 140 mmol/L (ref 135–145)

## 2017-12-13 LAB — HEMOGLOBIN A1C: Hemoglobin A1C: 9.1

## 2017-12-13 MED ORDER — TORSEMIDE 20 MG PO TABS: 60.0000 mg | ORAL_TABLET | Freq: Every day | ORAL | 6 refills | Status: DC

## 2017-12-13 MED ORDER — LACTULOSE 10 GM/15ML PO SOLN: ORAL | 6 refills | Status: DC

## 2017-12-13 NOTE — Patient Instructions (Addendum)
02 2lpm  Is the minimal setting  and adjust to keep the 02 sats  above 90% whether on bipap or not   Please remember to go to the  x-ray department downstairs in the basement  for your tests - we will call you with the results when they are available.     I will contact you regarding  longterm management  of the bipap machine
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
## 2017-12-13 NOTE — Telephone Encounter (Signed)
Ok to add on to end of day but will need to bring all active meds/ inhalers/solutions with him or see brian earlier and I will walk over and see him on Side A between pts

## 2017-12-13 NOTE — Patient Instructions (Addendum)
Take 2 extra Torsemide tablets today Increase Torsemide to 3 tabs daily  - start Tuesday 12/14/17 Hold spironolactone until labs next week - called after appointment Continue other Medications Come back to HF Clinic for labs Monday 7/15 at 9:45 Dr Shirlee Latch Aug 9 at 2:40

## 2017-12-13 NOTE — Telephone Encounter (Signed)
Called pt's daughter [name] who stated pt's cardiologist said pt has 47% fluid on lungs and pt's O2 has been dropping to 75% at night.  [name] is wanting pt to be seen today by MW, and MW only.  [name] has been made aware that MW has no openings on schedule today and is insisting pt to be seen by MW only.  Dr. [name], please advise if it is okay for [place] to DB pt on your schedule today or please advise recommendations for pt. Thanks!

## 2017-12-13 NOTE — Telephone Encounter (Signed)
Called pt's daughter and relayed the information per MW for pt to be seen at end of day if only wants pt to see him.  Per crystal, she only wants pt to see MW.  appt was added for pt and was stated to crystal to bring all of pt's meds with them when they come to OV.  [name] expressed understanding. Nothing further needed.

## 2017-12-13 NOTE — Progress Notes (Signed)
PCP: Dr. Donette Larry Cardiology: Dr. Shirlee Latch   HPI:  72 y.o.male with history of chronic atrial fibrillation with AV nodal ablation and BiV pacing, mixed ischemic/nonischemic cardiomyopathy, chronic ETOH abuse, MV replacement x 2, and CAD. He was hospitalized with acute on chronic systolic CHF in 9/15. He had not been taking any medications. CTA chest negative for PE. He was diuresed and started back on cardiac meds. He was discharged to live with his daughter. He has quit drinking since living with his daughter. He was admitted again in 3/16 with NSTEMI, CHF. LHC showed moderate distal LM disease (6.9 mm2 by IVUS), 80% ostial LCx stenosis, and total occlusion RCA with collaterals. Ostial LCx was poor target for intervention, he was managed medically.   Admittedin 12/2014 for COPD exacerbation. Also diuresed 17 lbs that admission. Discharged weight was 154 lb.  Admitted to [facility] 9/25 through 03/10/16 for acute on chronic hypoxemic and hypercapnic respiratory failure, exacerbation of COPD, and acute on chronic systolic heart failure. Patient was evaluated by cardiology during his hospitalization. He was diuresed with IV diuretics, which was then transitioned to oral diuretic 40 mg lasix daily. Patient continued to diurese well. He did receive steroids as well as antibiotics for his acute COPD exacerbation, which have been completed at the time of discharge. Discharge weight was 161 pounds.   Echo in 10/18 showed EF 25-30%, moderately decreased RV systolic function, stable bioprosthetic mitral valve.   He was admitted again in 1/19 with CHF exacerbation. RHC/LHC showed 90% distal LM, chronic totally occluded pRCA. He is not a CABG candidate with severe COPD. Vascular studies showed severe PAD so he is a poor Impella candidate. After consultation with the interventional cardiology service, we decided that he would be a poor LM PCI candidate (unable to place Impella). He was diuresed this  admission and Entresto was stopped due to hypotension.   He was admitted in 5/19 with acute on chronic systolic CHF and encephalopathy.  Echo in 5/19 showed EF 30-35%, mild LV dilation, normal bioprosthetic mitral valve, moderately decreased RV systolic function.  He was diuresed.  He developed progressive lethargy and had to be intubated for airway protection.  He was started on lactulose for suspected hepatic encephalopathy and after extubation, was on Bipap at night for hypercarbic respiratory failure.   At last HF clinic visit he was doing better, more alert on lactulose and Bipap.  Not sleeping as much during the day and weight was down considerably.  However he presents today  for initial pharmacy medication titration clinic.  Weight up about 5lbs from last visit, he is more winded with ambulation, is sleeping sitting up in bed,his legs have about 1+ pitting edema it sounds like per family he sneaks salt shake and salty food from pantry when he thinks others are not looking.  At last visit started spironolcatone 12.5mg  daily. Follow up labs 11/19/17 Cr 1.2 stable and K 4.6 stable.         . Shortness of breath/dyspnea on exertion? yes  . Orthopnea/PND? yes . Edema? Yes LE 1+ B/L . Lightheadedness/dizziness? no . Daily weights at home? no . Blood pressure/heart rate monitoring at home? no . Following low-sodium/fluid-restricted diet? no  HF Medications: Carvedilol 6.25mg  BID Digoxin 0.125mg  Daily Losartan 12.5mg  Daily Spironolactone 12.5mg  Daily Torsemide 40mg  Daily  Has the patient been experiencing any side effects to the medications prescribed?  no  Does the patient have any problems obtaining medications due to transportation or finances?   no  Understanding of regimen: fair Understanding of indications: fair Potential of compliance: fair Patient understands to avoid NSAIDs. Patient understands to avoid decongestants.    Pertinent Lab Values: 12/13/17 . Serum creatinine  1.4 ( up slightly), CO2 40 , Potassium 5.6 (up 4.7 but hemolyzed), Sodium 140,  Vital Signs: . Weight: 165lb (dry weight: 160lb) . Blood pressure: 118/64 . Heart rate: 72 . O2 91% - 2L O2 . ReDS 47%  Assessment: 1. Chronicsystolic CHF (EF 03%), due to ICM/NICM. NYHA class IIIsymptoms.   -Volume status elevated - weight up 5lb since last visit, admits to using increased salt, LE B/L 1+ Edema, sleeping sitting up, and ReDS reading 47% - recently Torsemide dose decreased at hospital DC > will have patient take extra 40mg  tonight then increase Torsemide 60mg  Daily  - BP soft stable 118/64, no lightheadedness, HR stable 72 -labs checked today - Cr up slightly 1.2>1.4 and K elevated 4.7> 5.6 (slight hemolysis - may inc level) - will hold spironolactone and recheck labs next week -will continue  Other current HF medications Carvedilol 6.25mg  BID Digoxin 0.125mg  Daily Losartan 12.5mg  Daily  - Basic disease state pathophysiology, medication indication, mechanism and side effects reviewed at length with patient and he verbalized understanding   2. Atrial fibrillation:Chronic,s/p AV nodal ablation. - Continue Eliquis for anticoagulation. 3. PAD:No claudication symptoms.  - Follows at [facility].  4. CAD:No chest pain. Planning to medically manage LM stenosis =>not a CABG candidate due to severe COPD and not PCI candidate due to inability to use Impella (have reviewed extensively with interventional cardiology). - No ASA with stable CAD and need for Eliquis.  - Continue statin, 5. Mitral valve replacement:Bioprosthetic valve currently, originally Bjork-Shiley. Valve looked ok on 5/19echo.  6. ETOH abuse:Has quit drinking. 7. COPD:On home oxygen, severe. Has now quit smoking.  - admits to sneaking cigarettes several times a week. 8. OSA: Hypercarbic respiratory failure in the hospital. - Continue Bipap.  Having difficulty with O2 sats - will follow up with pulmonary - Sleep study  completed - followed by pulmonary 9. 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  Ran out of lactulose this weekend - refilled RX today.      Plan: 1) Medication changes: Based on clinical presentation - volume status elevated - increase torsemide - take extra 40mg  today then increase to 60mg  daily vital signs BP and HR stable recent labs Cr slight bump 1.2>1.4 and bump in K - will hold spironolactone and recheck BMET in 1 week  2) Labs: BMET Monday 7/15 3) Follow-up: Dr Shirlee Latch 01/14/18    Leota Sauers Pharm.D. CPP, BCPS Clinical Pharmacist [phone] 12/13/2017 5:34 PM

## 2017-12-14 ENCOUNTER — Encounter: Payer: Self-pay | Admitting: Internal Medicine

## 2017-12-14 NOTE — Progress Notes (Signed)
Spoke with pt's daughter, [name], and notified of results per Dr. [name]. She verbalized understanding and denied any questions. PFT and OV scheduled.

## 2017-12-14 NOTE — Assessment & Plan Note (Signed)
Spirometry 10/11/15  Restrictive only but did not breath out the full 6 secs and does have mild curvature on F/V - bipap hs since d/c 11/03/17 for hypercabia (see separate a/p)   He does not appear hyper inflated on cxr and his flares of resp distress / hypoxemia tend to be more related to vol overload than anything that sounds like aecopd so probably best described as Pt is Group B in terms of symptom/risk and laba/lama therefore appropriate rx at this point but still smoking so this may be moving target.  rec D/c cigs Consider d/c bud and add yupelri when returns for pfts but doing well enough for now to leave on laba/ics   I had an extended discussion with the patient reviewing all relevant studies completed to date and  lasting 25 minutes of a 40  minute post hosp visit to re-establish     re  severe non-specific but potentially very serious refractory respiratory symptoms of uncertain and potentially multiple  etiologies.   Each maintenance medication was reviewed in detail including most importantly the difference between maintenance and prns and under what circumstances the prns are to be triggered using an action plan format that is not reflected in the computer generated alphabetically organized AVS.    Please see AVS for specific instructions unique to this office visit that I personally wrote and verbalized to the the pt in detail and then reviewed with pt  by my nurse highlighting any changes in therapy/plan of care  recommended at today's visit.

## 2017-12-14 NOTE — Assessment & Plan Note (Signed)
HC03  06/13/17 = 39  - PSS 12/03/17 c/w no osa/ noct desats corrected on 2lpm s bipap - HC03 12/13/2017  = 40   Well compensated though tendency to hyper somnolence from hyper carbia suggests he should continue bipap at hs despite reassuring sleep study, strongly doubt he will comply though as the hypercabia actually serves to make him feel better than he really is.  For now no change rx:  2lpm minimal setting and titrate to keep > 90% but no higher as do not want to suppress resp drive.

## 2017-12-14 NOTE — Assessment & Plan Note (Signed)
4-5 min discussion re active cigarette smoking in addition to office E&M  Ask about tobacco use:   Ongoing sneaking when he can Advise quitting   I emphasized that although we never turn away smokers from the pulmonary clinic, we do ask that they understand that the recommendations that we make  won't work nearly as well in the presence of continued cigarette exposure. In fact, we may very well  reach a point where we can't promise to help the patient if he/she can't quit smoking. (We can and will promise to try to help, we just can't promise what we recommend will really work)  Assess willingness:  Not really committed at this point Assist in quit attempt:  Per PCP when ready Arrange follow up:   Follow up per Primary Care planned

## 2017-12-15 DIAGNOSIS — J449 Chronic obstructive pulmonary disease, unspecified: Secondary | ICD-10-CM | POA: Diagnosis not present

## 2017-12-15 DIAGNOSIS — I259 Chronic ischemic heart disease, unspecified: Secondary | ICD-10-CM | POA: Diagnosis not present

## 2017-12-17 ENCOUNTER — Other Ambulatory Visit (HOSPITAL_COMMUNITY): Payer: Self-pay

## 2017-12-17 MED ORDER — LACTULOSE 10 GM/15ML PO SOLN: ORAL | 6 refills | Status: DC

## 2017-12-18 DIAGNOSIS — J449 Chronic obstructive pulmonary disease, unspecified: Secondary | ICD-10-CM | POA: Diagnosis not present

## 2017-12-18 DIAGNOSIS — I259 Chronic ischemic heart disease, unspecified: Secondary | ICD-10-CM | POA: Diagnosis not present

## 2017-12-20 ENCOUNTER — Other Ambulatory Visit (HOSPITAL_COMMUNITY): Payer: Self-pay | Admitting: *Deleted

## 2017-12-20 ENCOUNTER — Ambulatory Visit (HOSPITAL_COMMUNITY)
Admission: RE | Admit: 2017-12-20 | Discharge: 2017-12-20 | Disposition: A | Discharge status: Home / Self Care | Payer: Medicare Other | Source: Ambulatory Visit | Attending: Cardiology | Admitting: Cardiology

## 2017-12-20 DIAGNOSIS — I5022 Chronic systolic (congestive) heart failure: Secondary | ICD-10-CM | POA: Diagnosis not present

## 2017-12-20 LAB — BASIC METABOLIC PANEL
ANION GAP: 11 (ref 5–15)
BUN: 25 mg/dL — ABNORMAL HIGH (ref 8–23)
CHLORIDE: 98 mmol/L (ref 98–111)
CO2: 33 mmol/L — AB (ref 22–32)
CREATININE: 1.32 mg/dL — AB (ref 0.61–1.24)
Calcium: 9.3 mg/dL (ref 8.9–10.3)
GFR calc non Af Amer: 53 mL/min — ABNORMAL LOW (ref >60)
Glucose, Bld: 261 mg/dL — ABNORMAL HIGH (ref 70–99)
POTASSIUM: 4.4 mmol/L (ref 3.5–5.1)
Sodium: 142 mmol/L (ref 135–145)

## 2017-12-20 LAB — AMMONIA: Ammonia: 38 umol/L — ABNORMAL HIGH (ref 9–35)

## 2017-12-21 ENCOUNTER — Other Ambulatory Visit (HOSPITAL_COMMUNITY): Payer: Self-pay | Admitting: *Deleted

## 2017-12-21 DIAGNOSIS — J449 Chronic obstructive pulmonary disease, unspecified: Secondary | ICD-10-CM | POA: Diagnosis not present

## 2017-12-21 DIAGNOSIS — I259 Chronic ischemic heart disease, unspecified: Secondary | ICD-10-CM | POA: Diagnosis not present

## 2017-12-21 MED ORDER — TORSEMIDE 20 MG PO TABS
60.0000 mg | ORAL_TABLET | Freq: Every day | ORAL | 6 refills | Status: DC
Start: 2017-12-21 — End: 2017-12-23

## 2017-12-22 ENCOUNTER — Telehealth (HOSPITAL_COMMUNITY): Payer: Self-pay

## 2017-12-22 NOTE — Telephone Encounter (Signed)
Attempted to contact patient to see if he is available to come into orientation on 12/27/17 at 12:00pm instead of 1:30pm. Lm on vm

## 2017-12-23 ENCOUNTER — Ambulatory Visit (INDEPENDENT_AMBULATORY_CARE_PROVIDER_SITE_OTHER): Payer: Medicare Other | Admitting: Internal Medicine

## 2017-12-23 ENCOUNTER — Telehealth (HOSPITAL_COMMUNITY): Payer: Self-pay | Admitting: *Deleted

## 2017-12-23 ENCOUNTER — Encounter: Payer: Self-pay | Admitting: Internal Medicine

## 2017-12-23 ENCOUNTER — Other Ambulatory Visit (HOSPITAL_COMMUNITY): Payer: Self-pay | Admitting: Cardiology

## 2017-12-23 VITALS — BP 110/62 | HR 70 | Ht 63.7 in | Wt 162.0 lb

## 2017-12-23 DIAGNOSIS — J9612 Chronic respiratory failure with hypercapnia: Secondary | ICD-10-CM

## 2017-12-23 DIAGNOSIS — F17210 Nicotine dependence, cigarettes, uncomplicated: Secondary | ICD-10-CM

## 2017-12-23 DIAGNOSIS — J9611 Chronic respiratory failure with hypoxia: Secondary | ICD-10-CM

## 2017-12-23 DIAGNOSIS — J449 Chronic obstructive pulmonary disease, unspecified: Secondary | ICD-10-CM | POA: Diagnosis not present

## 2017-12-23 LAB — PULMONARY FUNCTION TEST
DL/VA % pred: 92 %
DL/VA: 3.83 ml/min/mmHg/L
DLCO UNC % PRED: 30 %
DLCO unc: 7.17 ml/min/mmHg
FEF 25-75 Post: 0.86 L/sec
FEF 25-75 Pre: 0.92 L/sec
FEF2575-%Change-Post: -6 %
FEF2575-%PRED-POST: 45 %
FEF2575-%Pred-Pre: 48 %
FEV1-%CHANGE-POST: -1 %
FEV1-%Pred-Post: 39 %
FEV1-%Pred-Pre: 40 %
FEV1-POST: 0.98 L
FEV1-Pre: 1 L
FEV1FVC-%Change-Post: 0 %
FEV1FVC-%PRED-PRE: 108 %
FEV6-%Change-Post: -1 %
FEV6-%Pred-Post: 38 %
FEV6-%Pred-Pre: 38 %
FEV6-POST: 1.21 L
FEV6-Pre: 1.23 L
FEV6FVC-%Pred-Post: 107 %
FEV6FVC-%Pred-Pre: 107 %
FVC-%Change-Post: -2 %
FVC-%PRED-POST: 35 %
FVC-%Pred-Pre: 36 %
FVC-Post: 1.21 L
FVC-Pre: 1.24 L
PRE FEV6/FVC RATIO: 100 %
Post FEV1/FVC ratio: 81 %
Post FEV6/FVC ratio: 100 %
Pre FEV1/FVC ratio: 81 %
RV % PRED: 88 %
RV: 1.86 L
TLC % pred: 53 %
TLC: 3.06 L

## 2017-12-23 MED ORDER — TORSEMIDE 20 MG PO TABS: 60.0000 mg | ORAL_TABLET | Freq: Every day | ORAL | 6 refills | Status: DC

## 2017-12-23 NOTE — Assessment & Plan Note (Addendum)
Spirometry 10/11/15  Restrictive only but did not breath out the full 6 secs and does have mild curvature on F/V - bipap hs since d/c 11/03/17 for hypercabia (see separate a/p)    - PFT's  12/23/2017  FEV1 1.00 (40 % ) ratio 81  p no % improvement from saba p brovana  prior to study with DLCO  30 % corrects to 92  % for alv volume  / only mild curvature    Not really clear he has much copd but doing well on brovana/ budesonide so no change rx needed   Needs to return with all meds in hand using a trust but verify approach to confirm accurate Medication  Reconciliation The principal here is that until we are certain that the  patients are doing what we've asked, it makes no sense to ask them to do more.
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
## 2017-12-23 NOTE — Progress Notes (Signed)
PFT completed today. 12/23/17  

## 2017-12-23 NOTE — Assessment & Plan Note (Signed)
HC03  06/13/17 = 39  - PSS 12/03/17 c/w no osa/ noct desats corrected on 2lpm s bipap - HC03 12/13/2017  = 40  - HC02  12/20/17   = 34   Tolerating cpap, appears to be benefiting so continue for now

## 2017-12-23 NOTE — Patient Instructions (Addendum)
Go ahead pulmonary rehab as planned   Goal for your 02 is to keep your saturations 90-94% but no higher    Be sure to check the list for accuracy and call us right away if there are any discrepancies at all between our list and what you are actually taking     Please schedule a follow up visit in 3 months but call sooner if needed  with all medications /inhalers/ solutions in hand so we can verify exactly what you are taking. This includes all medications from all doctors and over the counters

## 2017-12-23 NOTE — Assessment & Plan Note (Signed)
See prior discussion   Advised re dangers of cigs and 02

## 2017-12-27 ENCOUNTER — Encounter (HOSPITAL_COMMUNITY): Payer: Self-pay

## 2017-12-27 ENCOUNTER — Encounter (HOSPITAL_COMMUNITY)
Admission: RE | Admit: 2017-12-27 | Discharge: 2017-12-27 | Disposition: A | Discharge status: Home / Self Care | Payer: Medicare Other | Source: Ambulatory Visit | Attending: Internal Medicine | Admitting: Internal Medicine

## 2017-12-27 ENCOUNTER — Ambulatory Visit
Admission: RE | Admit: 2017-12-27 | Discharge: 2017-12-27 | Disposition: A | Discharge status: Home / Self Care | Payer: Medicare Other | Source: Ambulatory Visit | Attending: Internal Medicine | Admitting: Internal Medicine

## 2017-12-27 ENCOUNTER — Other Ambulatory Visit: Payer: Self-pay | Admitting: Internal Medicine

## 2017-12-27 VITALS — BP 132/72 | HR 60 | Ht 67.0 in | Wt 164.7 lb

## 2017-12-27 DIAGNOSIS — R1110 Vomiting, unspecified: Secondary | ICD-10-CM

## 2017-12-27 DIAGNOSIS — J9612 Chronic respiratory failure with hypercapnia: Secondary | ICD-10-CM | POA: Insufficient documentation

## 2017-12-27 DIAGNOSIS — J9611 Chronic respiratory failure with hypoxia: Secondary | ICD-10-CM | POA: Insufficient documentation

## 2017-12-27 DIAGNOSIS — K8020 Calculus of gallbladder without cholecystitis without obstruction: Secondary | ICD-10-CM | POA: Diagnosis not present

## 2017-12-27 NOTE — Progress Notes (Signed)
[name] 71 y.o. male Pulmonary Rehab Orientation Note Patient arrived today in Cardiac and Pulmonary Rehab for orientation to Pulmonary Rehab. He was transported from [facility] via wheel chair. He does carry portable oxygen. Per pt, he uses oxygen continuously. He uses 2 Liters during the day and 3 liters at night with a non-invasive ventilator.  Color good, skin warm and dry. Patient is oriented to time and place. Patient's medical history, psychosocial health, and medications reviewed. Psychosocial assessment reveals pt lives with his daughter and her family. 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 Pt hobbies include watching television. Pt reports his stress level is low. Pt does not exhibit  signs of depression.  PHQ2/9 score 0/0. Pt shows good  coping skills with positive outlook .  Will continue to monitor and evaluate psychosocial wellness while in the program. Physical assessment reveals heart rate is normal, breath sounds clear to auscultation, no wheezes, rales, or rhonchi. Grip strength equal, strong. Distal pulses 3+ bilateral posterior tibial pulses present without peripheral edema. Patient reports he does take medications as prescribed, and his daughter manages his medications. Patient states he follows a Low Sodium diet. The patient reports no specific efforts to gain or lose weight.. Patient's weight will be monitored closely. Demonstration and practice of PLB using pulse oximeter. Patient able to return demonstration satisfactorily. Safety and hand hygiene in the exercise area reviewed with patient. Patient voices understanding of the information reviewed. Department expectations discussed with patient and achievable goals were set. The patient shows enthusiasm about attending the program and we look forward to working with this nice gentleman. The patient is scheduled for a 6 min walk test on Tuesday, 01/04/2018 @ 3 pm and to begin  exercise on Tuesday, January 11, 2018 in the 1:30 pm class.  [phone]

## 2018-01-03 DIAGNOSIS — I259 Chronic ischemic heart disease, unspecified: Secondary | ICD-10-CM | POA: Diagnosis not present

## 2018-01-03 DIAGNOSIS — J449 Chronic obstructive pulmonary disease, unspecified: Secondary | ICD-10-CM | POA: Diagnosis not present

## 2018-01-04 ENCOUNTER — Ambulatory Visit (HOSPITAL_COMMUNITY): Payer: Self-pay

## 2018-01-05 DIAGNOSIS — I5043 Acute on chronic combined systolic (congestive) and diastolic (congestive) heart failure: Secondary | ICD-10-CM | POA: Diagnosis not present

## 2018-01-05 DIAGNOSIS — I259 Chronic ischemic heart disease, unspecified: Secondary | ICD-10-CM | POA: Diagnosis not present

## 2018-01-06 ENCOUNTER — Other Ambulatory Visit (HOSPITAL_COMMUNITY): Payer: Self-pay

## 2018-01-06 MED ORDER — ARFORMOTEROL TARTRATE 15 MCG/2ML IN NEBU: 15.0000 ug | INHALATION_SOLUTION | Freq: Two times a day (BID) | RESPIRATORY_TRACT | 1 refills | Status: DC

## 2018-01-08 DIAGNOSIS — J449 Chronic obstructive pulmonary disease, unspecified: Secondary | ICD-10-CM | POA: Diagnosis not present

## 2018-01-08 DIAGNOSIS — I259 Chronic ischemic heart disease, unspecified: Secondary | ICD-10-CM | POA: Diagnosis not present

## 2018-01-11 ENCOUNTER — Encounter (HOSPITAL_COMMUNITY)
Admission: RE | Admit: 2018-01-11 | Discharge: 2018-01-11 | Disposition: A | Discharge status: Home / Self Care | Payer: Medicare Other | Source: Ambulatory Visit | Attending: Cardiology | Admitting: Cardiology

## 2018-01-11 ENCOUNTER — Encounter (HOSPITAL_COMMUNITY)
Admission: RE | Admit: 2018-01-11 | Discharge: 2018-01-11 | Disposition: A | Discharge status: Home / Self Care | Payer: Medicare Other | Source: Ambulatory Visit | Attending: Internal Medicine | Admitting: Internal Medicine

## 2018-01-11 DIAGNOSIS — J9612 Chronic respiratory failure with hypercapnia: Secondary | ICD-10-CM | POA: Insufficient documentation

## 2018-01-11 DIAGNOSIS — J9611 Chronic respiratory failure with hypoxia: Secondary | ICD-10-CM | POA: Insufficient documentation

## 2018-01-11 DIAGNOSIS — I5022 Chronic systolic (congestive) heart failure: Secondary | ICD-10-CM

## 2018-01-13 ENCOUNTER — Encounter (HOSPITAL_COMMUNITY): Payer: Medicare Other

## 2018-01-13 NOTE — Progress Notes (Signed)
Pulmonary Individual Treatment Plan  Patient Details  Name: [name] MRN: [id number] Date of Birth: 1945-12-26 Referring Provider:     Pulmonary Rehab Walk Test from 01/11/2018 in [facility]  Referring Provider  Dr. [name]      Initial Encounter Date:    Pulmonary Rehab Walk Test from 01/11/2018 in [facility]  Date  01/13/18      Visit Diagnosis: Chronic respiratory failure with hypoxia and hypercapnia (HCC)  Chronic systolic congestive heart failure (HCC)  Patient's Home Medications on Admission:   Current Outpatient Medications:  .  apixaban (ELIQUIS) 5 MG TABS tablet, Take 1 tablet (5 mg total) by mouth 2 (two) times daily., Disp: 180 tablet, Rfl: 1 .  arformoterol (BROVANA) 15 MCG/2ML NEBU, Take 2 mLs (15 mcg total) by nebulization 2 (two) times daily for 15 days., Disp: 120 mL, Rfl: 1 .  atorvastatin (LIPITOR) 40 MG tablet, Take 1 tablet (40 mg total) by mouth daily at 6 PM., Disp: 90 tablet, Rfl: 1 .  budesonide (PULMICORT) 0.5 MG/2ML nebulizer solution, Take 2 mLs (0.5 mg total) by nebulization 2 (two) times daily. DX: J44.9, Disp: 120 mL, Rfl: 5 .  carvedilol (COREG) 6.25 MG tablet, Take 1 tablet (6.25 mg total) by mouth 2 (two) times daily with a meal., Disp: 180 tablet, Rfl: 3 .  digoxin (LANOXIN) 0.125 MG tablet, Take 1 tablet (0.125 mg total) by mouth daily., Disp: 90 tablet, Rfl: 3 .  glipiZIDE (GLUCOTROL) 5 MG tablet, Take 5 mg daily before breakfast by mouth., Disp: , Rfl:  .  ipratropium-albuterol (DUONEB) 0.5-2.5 (3) MG/3ML SOLN, Take 3 mLs by nebulization every 4 (four) hours as needed., Disp: 360 mL, Rfl: 0 .  lactulose (CHRONULAC) 10 GM/15ML solution, TAKE 45 MILLILITERS BY MOUTH TWICE A DAY, Disp: 2700 mL, Rfl: 6 .  loratadine (CLARITIN) 10 MG tablet, Take 1 tablet (10 mg total) by mouth daily., Disp: 30 tablet, Rfl: 1 .  losartan (COZAAR) 25 MG tablet, Take 0.5 tablets (12.5 mg total) by mouth  daily., Disp: 45 tablet, Rfl: 3 .  Multiple Vitamin (MULTIVITAMIN WITH MINERALS) TABS tablet, Take 1 tablet by mouth daily., Disp: 30 tablet, Rfl: 2 .  nitroGLYCERIN (NITROSTAT) 0.4 MG SL tablet, Place 1 tablet (0.4 mg total) under the tongue every 5 (five) minutes as needed for chest pain. (Patient not taking: Reported on 12/27/2017), Disp: 25 tablet, Rfl: 2 .  OXYGEN, Inhale 2.5 L into the lungs continuous. , Disp: , Rfl:  .  senna-docusate (SENOKOT-S) 8.6-50 MG tablet, Take 1 tablet by mouth daily. Hold for diarrhea., Disp: 30 tablet, Rfl: 0 .  thiamine 100 MG tablet, Take 1 tablet (100 mg total) by mouth daily., Disp: 30 tablet, Rfl: 0 .  torsemide (DEMADEX) 20 MG tablet, Take 3 tablets (60 mg total) by mouth daily., Disp: 90 tablet, Rfl: 6  Past Medical History: Past Medical History:  Diagnosis Date  . AICD (automatic cardioverter/defibrillator) present   . Alcohol abuse   . Anxiety   . Bipolar disorder (HCC)   . Biventricular automatic implantable cardioverter defibrillator in situ    a. 01/2014 s/p MDT [id number] Viva XT CRT-D (ser # [id number]).  . CAD (coronary artery disease)    a. s/p prior PCI/stenting of the LCX;  b. 08/2014 MV: EF 20%, large septal, apical, and inferior infarct from apex to base, no ischemia;  c. 08/2014 NSTEMI/Cath: LM mod distal dzs extending into ostial LCX (80%), LAD tortuous,  RI nl, OM mod dzs, RCA 100 CTO with R->R and L->R collats-->Med Rx.  . Chronic atrial fibrillation (HCC)    a. CHA2DS2VASc = 6->eliquis;  b. S/P AVN RFCA an BiV ICD placement.  . Chronic respiratory failure (HCC)   . Chronic systolic congestive heart failure (HCC)    a. 01/2014 Echo: EF 25-30%, mod conc LVH, mod dil LA.  . CKD (chronic kidney disease)    a. suspect stage II-III based on historical labs.  . Complete heart block (HCC)    a. In setting of prior AV nodal ablation r/t afib-->BiV ICD (01/2014).  . COPD (chronic obstructive pulmonary disease) (HCC)   . CVA (cerebral vascular  accident) (HCC)    a. Multiple prior embolic strokes.  . Dementia   . Depression   . DVT (deep venous thrombosis) (HCC)   . Dyslipidemia   . Hyperglycemia   . Hypertension   . Mitral valve disease    a. remote mitral replacement with Bjork Shiley valve;  b. 06/2006 Redo MVR with tissue valve.  . Mixed Ischemic and Nonischemic Cardiomyopathy    a. 8/.2015 Echo: Ef 25-30%;  b. 01/2014 s/p MDT [id number] Talbot Grumbling XT CRT-D (ser # [id number] H).  . On home oxygen therapy    "2L; 24/7" (65/16/2019)  . Psoriasis    "back of his head; elbows; finger of left hand" (06/07/2017)  . Ventral hernia     Tobacco Use: Social History   Tobacco Use  Smoking Status Current Some Day Smoker  . Packs/day: 2.00  . Years: 45.00  . Pack years: 90.00  . Types: Cigarettes  Smokeless Tobacco Never Used  Tobacco Comment   daughter states he cannot get to cigarettes now    Labs: Recent Review Flowsheet Data    Labs for ITP Cardiac and Pulmonary Rehab Latest Ref Rng & Units 10/24/2017 10/25/2017 10/26/2017 10/26/2017 11/12/2017   Cholestrol 0 - 200 mg/dL - - - - 119   LDLCALC 0 - 99 mg/dL - - - - 53   HDL >14 mg/dL - - - - 78(G)   Trlycerides <150 mg/dL - - - - 956(O)   Hemoglobin A1c 4.8 - 5.6 % - 6.9(H) - - -   PHART 7.350 - 7.450 7.376 7.433 7.415 7.465(H) -   PCO2ART 32.0 - 48.0 mmHg 83.4(HH) 73.6(HH) 69.1(HH) 63.9(H) -   HCO3 20.0 - 28.0 mmol/L 48.0(H) 48.4(H) 43.4(H) 46.0(H) -   TCO2 22 - 32 mmol/L - - - 48(H) -   ACIDBASEDEF 0.0 - 2.0 mmol/L - - - - -   O2SAT % 95.5 96.5 97.4 100.0 -      Capillary Blood Glucose: Lab Results  Component Value Date   GLUCAP 234 (H) 11/03/2017   GLUCAP 181 (H) 11/03/2017   GLUCAP 166 (H) 11/02/2017   GLUCAP 180 (H) 11/02/2017   GLUCAP 215 (H) 11/02/2017     Pulmonary Assessment Scores:   Pulmonary Function Assessment:   Exercise Target Goals: Date: 01/13/18  Exercise Program Goal: Individual exercise prescription set using results from initial 6 min walk test  and THRR while considering  patient's activity barriers and safety.    Exercise Prescription Goal: Initial exercise prescription builds to 30-45 minutes a day of aerobic activity, 2-3 days per week.  Home exercise guidelines will be given to patient during program as part of exercise prescription that the participant will acknowledge.  Activity Barriers & Risk Stratification:   6 Minute Walk: 6 Minute Walk    Row Name 01/13/18 [id number]  6 Minute Walk   Phase  Initial     Distance  1000 feet     Walk Time  6 minutes     # of Rest Breaks  0     MPH  1.89     METS  2.45     RPE  12     Perceived Dyspnea   0     Symptoms  Yes (comment)     Comments  used wheelchair     Resting HR  65 bpm     Resting BP  114/64     Resting Oxygen Saturation   92 %     Exercise Oxygen Saturation  during 6 min walk  91 %     Max Ex. HR  96 bpm     Max Ex. BP  138/64       Interval HR   1 Minute HR  87     2 Minute HR  91     3 Minute HR  91     4 Minute HR  95     5 Minute HR  96     6 Minute HR  92     Interval Heart Rate?  Yes       Interval Oxygen   Interval Oxygen?  Yes     Baseline Oxygen Saturation %  92 %     1 Minute Oxygen Saturation %  91 %     1 Minute Liters of Oxygen  2 L     2 Minute Oxygen Saturation %  93 %     2 Minute Liters of Oxygen  3 L     3 Minute Oxygen Saturation %  93 %     3 Minute Liters of Oxygen  3 L     4 Minute Oxygen Saturation %  93 %     4 Minute Liters of Oxygen  3 L     5 Minute Oxygen Saturation %  95 %     5 Minute Liters of Oxygen  3 L     6 Minute Oxygen Saturation %  95 %     6 Minute Liters of Oxygen  3 L        Oxygen Initial Assessment: Oxygen Initial Assessment - 01/13/18 0829      Initial 6 min Walk   Oxygen Used  Continuous    Liters per minute  3      Program Oxygen Prescription   Program Oxygen Prescription  Continuous;E-Tanks    Liters per minute  4       Oxygen Re-Evaluation:   Oxygen Discharge (Final Oxygen  Re-Evaluation):   Initial Exercise Prescription: Initial Exercise Prescription - 01/13/18 0800      Date of Initial Exercise RX and Referring Provider   Date  01/13/18    Referring Provider  Dr. [name]      Oxygen   Oxygen  Continuous    Liters  3      NuStep   Level  2    SPM  80    Minutes  17    METs  1.5      Arm Ergometer   Level  2    Minutes  17      Track   Laps  5    Minutes  17      Prescription Details   Frequency (times per week)  2    Duration  Progress to 45  minutes of aerobic exercise without signs/symptoms of physical distress      Intensity   THRR 40-80% of Max Heartrate  60-120    Ratings of Perceived Exertion  11-13    Perceived Dyspnea  0-4      Progression   Progression  Continue progressive overload as per policy without signs/symptoms or physical distress.      Resistance Training   Training Prescription  Yes    Weight  blue bands    Reps  10-15       Perform Capillary Blood Glucose checks as needed.  Exercise Prescription Changes:   Exercise Comments:   Exercise Goals and Review:   Exercise Goals Re-Evaluation :   Discharge Exercise Prescription (Final Exercise Prescription Changes):   Nutrition:  Target Goals: Understanding of nutrition guidelines, daily intake of sodium 1500mg , cholesterol 200mg , calories 30% from fat and 7% or less from saturated fats, daily to have 5 or more servings of fruits and vegetables.  Biometrics:    Nutrition Therapy Plan and Nutrition Goals:   Nutrition Assessments:   Nutrition Goals Re-Evaluation:   Nutrition Goals Discharge (Final Nutrition Goals Re-Evaluation):   Psychosocial: Target Goals: Acknowledge presence or absence of significant depression and/or stress, maximize coping skills, provide positive support system. Participant is able to verbalize types and ability to use techniques and skills needed for reducing stress and depression.  Initial Review & Psychosocial  Screening:   Quality of Life Scores:  Scores of 19 and below usually indicate a poorer quality of life in these areas.  A difference of  2-3 points is a clinically meaningful difference.  A difference of 2-3 points in the total score of the Quality of Life Index has been associated with significant improvement in overall quality of life, self-image, physical symptoms, and general health in studies assessing change in quality of life.   PHQ-9: Recent Review Flowsheet Data    Depression screen [facility] [place] 2/9 12/27/2017   Decreased Interest 0   Down, Depressed, Hopeless 0   PHQ - 2 Score 0     Interpretation of Total Score  Total Score Depression Severity:  1-4 = Minimal depression, 5-9 = Mild depression, 10-14 = Moderate depression, 15-19 = Moderately severe depression, 20-27 = Severe depression   Psychosocial Evaluation and Intervention:   Psychosocial Re-Evaluation:   Psychosocial Discharge (Final Psychosocial Re-Evaluation):   Education: Education Goals: Education classes will be provided on a weekly basis, covering required topics. Participant will state understanding/return demonstration of topics presented.  Learning Barriers/Preferences:   Education Topics: Risk Factor Reduction:  -Group instruction that is supported by a PowerPoint presentation. Instructor discusses the definition of a risk factor, different risk factors for pulmonary disease, and how the heart and lungs work together.     Nutrition for Pulmonary Patient:  -Group instruction provided by PowerPoint slides, verbal discussion, and written materials to support subject matter. The instructor gives an explanation and review of healthy diet recommendations, which includes a discussion on weight management, recommendations for fruit and vegetable consumption, as well as protein, fluid, caffeine, fiber, sodium, sugar, and alcohol. Tips for eating when patients are short of breath are discussed.   Pursed Lip  Breathing:  -Group instruction that is supported by demonstration and informational handouts. Instructor discusses the benefits of pursed lip and diaphragmatic breathing and detailed demonstration on how to preform both.     Oxygen Safety:  -Group instruction provided by PowerPoint, verbal discussion, and written material to support subject matter. There is an  overview of "What is Oxygen" and "Why do we need it".  Instructor also reviews how to create a safe environment for oxygen use, the importance of using oxygen as prescribed, and the risks of noncompliance. There is a brief discussion on traveling with oxygen and resources the patient may utilize.   Oxygen Equipment:  -Group instruction provided by [facility] Staff utilizing handouts, written materials, and equipment demonstrations.   Signs and Symptoms:  -Group instruction provided by written material and verbal discussion to support subject matter. Warning signs and symptoms of infection, stroke, and heart attack are reviewed and when to call the physician/911 reinforced. Tips for preventing the spread of infection discussed.   Advanced Directives:  -Group instruction provided by verbal instruction and written material to support subject matter. Instructor reviews Advanced Directive laws and proper instruction for filling out document.   Pulmonary Video:  -Group video education that reviews the importance of medication and oxygen compliance, exercise, good nutrition, pulmonary hygiene, and pursed lip and diaphragmatic breathing for the pulmonary patient.   Exercise for the Pulmonary Patient:  -Group instruction that is supported by a PowerPoint presentation. Instructor discusses benefits of exercise, core components of exercise, frequency, duration, and intensity of an exercise routine, importance of utilizing pulse oximetry during exercise, safety while exercising, and options of places to exercise outside of rehab.     Pulmonary  Medications:  -Verbally interactive group education provided by instructor with focus on inhaled medications and proper administration.   Anatomy and Physiology of the Respiratory System and Intimacy:  -Group instruction provided by PowerPoint, verbal discussion, and written material to support subject matter. Instructor reviews respiratory cycle and anatomical components of the respiratory system and their functions. Instructor also reviews differences in obstructive and restrictive respiratory diseases with examples of each. Intimacy, Sex, and Sexuality differences are reviewed with a discussion on how relationships can change when diagnosed with pulmonary disease. Common sexual concerns are reviewed.   MD DAY -A group question and answer session with a medical doctor that allows participants to ask questions that relate to their pulmonary disease state.   OTHER EDUCATION -Group or individual verbal, written, or video instructions that support the educational goals of the pulmonary rehab program.   Holiday Eating Survival Tips:  -Group instruction provided by PowerPoint slides, verbal discussion, and written materials to support subject matter. The instructor gives patients tips, tricks, and techniques to help them not only survive but enjoy the holidays despite the onslaught of food that accompanies the holidays.   Knowledge Questionnaire Score:   Core Components/Risk Factors/Patient Goals at Admission:   Core Components/Risk Factors/Patient Goals Review:    Core Components/Risk Factors/Patient Goals at Discharge (Final Review):    ITP Comments:   Comments:

## 2018-01-14 ENCOUNTER — Ambulatory Visit (HOSPITAL_COMMUNITY)
Admission: RE | Admit: 2018-01-14 | Discharge: 2018-01-14 | Disposition: A | Discharge status: Home / Self Care | Payer: Medicare Other | Source: Ambulatory Visit | Attending: Cardiology | Admitting: Cardiology

## 2018-01-14 ENCOUNTER — Encounter (HOSPITAL_COMMUNITY): Payer: Self-pay | Admitting: Cardiology

## 2018-01-14 ENCOUNTER — Other Ambulatory Visit: Payer: Self-pay

## 2018-01-14 VITALS — BP 110/72 | HR 82 | Wt 162.5 lb

## 2018-01-14 DIAGNOSIS — E785 Hyperlipidemia, unspecified: Secondary | ICD-10-CM | POA: Insufficient documentation

## 2018-01-14 DIAGNOSIS — I2510 Atherosclerotic heart disease of native coronary artery without angina pectoris: Secondary | ICD-10-CM | POA: Insufficient documentation

## 2018-01-14 DIAGNOSIS — Z7901 Long term (current) use of anticoagulants: Secondary | ICD-10-CM | POA: Diagnosis not present

## 2018-01-14 DIAGNOSIS — Z9981 Dependence on supplemental oxygen: Secondary | ICD-10-CM | POA: Diagnosis not present

## 2018-01-14 DIAGNOSIS — Z87891 Personal history of nicotine dependence: Secondary | ICD-10-CM | POA: Diagnosis not present

## 2018-01-14 DIAGNOSIS — I428 Other cardiomyopathies: Secondary | ICD-10-CM | POA: Diagnosis not present

## 2018-01-14 DIAGNOSIS — F319 Bipolar disorder, unspecified: Secondary | ICD-10-CM | POA: Diagnosis not present

## 2018-01-14 DIAGNOSIS — Z86718 Personal history of other venous thrombosis and embolism: Secondary | ICD-10-CM | POA: Diagnosis not present

## 2018-01-14 DIAGNOSIS — G4733 Obstructive sleep apnea (adult) (pediatric): Secondary | ICD-10-CM | POA: Diagnosis not present

## 2018-01-14 DIAGNOSIS — Z9889 Other specified postprocedural states: Secondary | ICD-10-CM | POA: Diagnosis not present

## 2018-01-14 DIAGNOSIS — J449 Chronic obstructive pulmonary disease, unspecified: Secondary | ICD-10-CM | POA: Insufficient documentation

## 2018-01-14 DIAGNOSIS — I739 Peripheral vascular disease, unspecified: Secondary | ICD-10-CM | POA: Diagnosis not present

## 2018-01-14 DIAGNOSIS — K7460 Unspecified cirrhosis of liver: Secondary | ICD-10-CM | POA: Diagnosis not present

## 2018-01-14 DIAGNOSIS — I426 Alcoholic cardiomyopathy: Secondary | ICD-10-CM | POA: Insufficient documentation

## 2018-01-14 DIAGNOSIS — Z8249 Family history of ischemic heart disease and other diseases of the circulatory system: Secondary | ICD-10-CM | POA: Diagnosis not present

## 2018-01-14 DIAGNOSIS — I5022 Chronic systolic (congestive) heart failure: Secondary | ICD-10-CM | POA: Diagnosis not present

## 2018-01-14 DIAGNOSIS — I482 Chronic atrial fibrillation, unspecified: Secondary | ICD-10-CM

## 2018-01-14 DIAGNOSIS — I110 Hypertensive heart disease with heart failure: Secondary | ICD-10-CM | POA: Insufficient documentation

## 2018-01-14 DIAGNOSIS — I255 Ischemic cardiomyopathy: Secondary | ICD-10-CM | POA: Diagnosis not present

## 2018-01-14 DIAGNOSIS — Z79899 Other long term (current) drug therapy: Secondary | ICD-10-CM | POA: Insufficient documentation

## 2018-01-14 DIAGNOSIS — Z955 Presence of coronary angioplasty implant and graft: Secondary | ICD-10-CM | POA: Insufficient documentation

## 2018-01-14 DIAGNOSIS — Z8673 Personal history of transient ischemic attack (TIA), and cerebral infarction without residual deficits: Secondary | ICD-10-CM | POA: Diagnosis not present

## 2018-01-14 DIAGNOSIS — Z953 Presence of xenogenic heart valve: Secondary | ICD-10-CM | POA: Diagnosis not present

## 2018-01-14 DIAGNOSIS — K7290 Hepatic failure, unspecified without coma: Secondary | ICD-10-CM | POA: Insufficient documentation

## 2018-01-14 DIAGNOSIS — Z7984 Long term (current) use of oral hypoglycemic drugs: Secondary | ICD-10-CM | POA: Diagnosis not present

## 2018-01-14 DIAGNOSIS — Z7951 Long term (current) use of inhaled steroids: Secondary | ICD-10-CM | POA: Insufficient documentation

## 2018-01-14 LAB — BASIC METABOLIC PANEL
Anion gap: 13 (ref 5–15)
BUN: 28 mg/dL — AB (ref 8–23)
CALCIUM: 10 mg/dL (ref 8.9–10.3)
CO2: 30 mmol/L (ref 22–32)
Chloride: 99 mmol/L (ref 98–111)
Creatinine, Ser: 1.26 mg/dL — ABNORMAL HIGH (ref 0.61–1.24)
GFR calc Af Amer: >60 mL/min (ref >60)
GFR, EST NON AFRICAN AMERICAN: 56 mL/min — AB (ref >60)
GLUCOSE: 150 mg/dL — AB (ref 70–99)
Potassium: 4 mmol/L (ref 3.5–5.1)
SODIUM: 142 mmol/L (ref 135–145)

## 2018-01-14 LAB — DIGOXIN LEVEL: DIGOXIN LVL: 0.8 ng/mL (ref 0.8–2.0)

## 2018-01-14 MED ORDER — TORSEMIDE 20 MG PO TABS
80.0000 mg | ORAL_TABLET | Freq: Every day | ORAL | 6 refills | Status: DC
Start: 2018-01-14 — End: 2018-01-14

## 2018-01-14 MED ORDER — SPIRONOLACTONE 25 MG PO TABS: 12.5000 mg | ORAL_TABLET | Freq: Every day | ORAL | 6 refills | Status: DC

## 2018-01-14 MED ORDER — LOSARTAN POTASSIUM 25 MG PO TABS
25.0000 mg | ORAL_TABLET | Freq: Every day | ORAL | 6 refills | Status: DC
Start: 2018-01-14 — End: 2018-04-12

## 2018-01-14 MED ORDER — TORSEMIDE 20 MG PO TABS: 80.0000 mg | ORAL_TABLET | Freq: Every day | ORAL | 6 refills | Status: DC

## 2018-01-14 MED ORDER — LOSARTAN POTASSIUM 25 MG PO TABS
25.0000 mg | ORAL_TABLET | Freq: Every day | ORAL | 6 refills | Status: DC
Start: 2018-01-14 — End: 2018-01-14

## 2018-01-14 NOTE — Patient Instructions (Signed)
Increase Torsemide to 80 mg (4 tabs) daily  Increase Losartan to 25 mg daily  Labs today  Your physician recommends that you schedule a follow-up appointment in: 6-8 weeks

## 2018-01-15 DIAGNOSIS — I259 Chronic ischemic heart disease, unspecified: Secondary | ICD-10-CM | POA: Diagnosis not present

## 2018-01-15 DIAGNOSIS — J449 Chronic obstructive pulmonary disease, unspecified: Secondary | ICD-10-CM | POA: Diagnosis not present
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
## 2018-01-18 ENCOUNTER — Encounter (HOSPITAL_COMMUNITY): Payer: Medicare Other

## 2018-01-19 ENCOUNTER — Other Ambulatory Visit (INDEPENDENT_AMBULATORY_CARE_PROVIDER_SITE_OTHER): Payer: Medicare Other

## 2018-01-19 ENCOUNTER — Telehealth: Payer: Self-pay | Admitting: Cardiology

## 2018-01-19 ENCOUNTER — Ambulatory Visit: Payer: Medicare Other | Admitting: Neurology

## 2018-01-19 ENCOUNTER — Encounter: Payer: Self-pay | Admitting: Neurology

## 2018-01-19 ENCOUNTER — Other Ambulatory Visit: Payer: Self-pay

## 2018-01-19 ENCOUNTER — Ambulatory Visit (INDEPENDENT_AMBULATORY_CARE_PROVIDER_SITE_OTHER): Payer: Medicare Other | Admitting: *Deleted

## 2018-01-19 VITALS — BP 122/60 | HR 78 | Ht 67.0 in | Wt 162.0 lb

## 2018-01-19 DIAGNOSIS — F0390 Unspecified dementia without behavioral disturbance: Secondary | ICD-10-CM

## 2018-01-19 DIAGNOSIS — F03B0 Unspecified dementia, moderate, without behavioral disturbance, psychotic disturbance, mood disturbance, and anxiety: Secondary | ICD-10-CM

## 2018-01-19 DIAGNOSIS — I5022 Chronic systolic (congestive) heart failure: Secondary | ICD-10-CM

## 2018-01-19 DIAGNOSIS — I255 Ischemic cardiomyopathy: Secondary | ICD-10-CM | POA: Diagnosis not present

## 2018-01-19 LAB — VITAMIN B12: Vitamin B-12: 761 pg/mL (ref 211–911)

## 2018-01-19 LAB — TSH: TSH: 2.13 u[IU]/mL (ref 0.35–4.50)

## 2018-01-19 NOTE — Patient Instructions (Addendum)
1. Bloodwork for TSH and B12  Your provider requests that you have LABS drawn today.  We share a lab with [facility] - they are located in [address] (second floor) of this building.  Once you get there, please have a seat and the phlebotomist will call your name.  If you have waited more than 15 minutes, please advise the front desk  2. Continue all your medications 3. Start looking into day programs  [facility]  [address]  [place], [place] [address]  Office: [phone]  4. Follow-up in 6 months or so, call for any changes  FALL PRECAUTIONS: Be cautious when walking. Scan the area for obstacles that may increase the risk of trips and falls. When getting up in the mornings, sit up at the edge of the bed for a few minutes before getting out of bed. Consider elevating the bed at the head end to avoid drop of blood pressure when getting up. Walk always in a well-lit room (use night lights in the walls). Avoid area rugs or power cords from appliances in the middle of the walkways. Use a walker or a cane if necessary and consider physical therapy for balance exercise. Get your eyesight checked regularly.  HOME SAFETY: Consider the safety of the kitchen when operating appliances like stoves, microwave oven, and blender. Consider having supervision and share cooking responsibilities until no longer able to participate in those. Accidents with firearms and other hazards in the house should be identified and addressed as well.  ABILITY TO BE LEFT ALONE: If patient is unable to contact 911 operator, consider using LifeLine, or when the need is there, arrange for someone to stay with patients. Smoking is a fire hazard, consider supervision or cessation. Risk of wandering should be assessed by caregiver and if detected at any point, supervision and safe proof recommendations should be instituted.   RECOMMENDATIONS FOR ALL PATIENTS WITH MEMORY PROBLEMS: 1. Continue to exercise  (Recommend 30 minutes of walking everyday, or 3 hours every week) 2. Increase social interactions - continue going to [place] and enjoy social gatherings with friends and family 3. Eat healthy, avoid fried foods and eat more fruits and vegetables 4. Maintain adequate blood pressure, blood sugar, and blood cholesterol level. Reducing the risk of stroke and cardiovascular disease also helps promoting better memory. 5. Avoid stressful situations. Live a simple life and avoid aggravations. Organize your time and prepare for the next day in anticipation. 6. Sleep well, avoid any interruptions of sleep and avoid any distractions in the bedroom that may interfere with adequate sleep quality 7. Avoid sugar, avoid sweets as there is a strong link between excessive sugar intake, diabetes, and cognitive impairment The Mediterranean diet has been shown to help patients reduce the risk of progressive memory disorders and reduces cardiovascular risk. This includes eating fish, eat fruits and green leafy vegetables, nuts like almonds and hazelnuts, walnuts, and also use olive oil. Avoid fast foods and fried foods as much as possible. Avoid sweets and sugar as sugar use has been linked to worsening of memory function.  There is always a concern of gradual progression of memory problems. If this is the case, then we may need to adjust level of care according to patient needs. Support, both to the patient and caregiver, should then be put into place.
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
## 2018-01-19 NOTE — Telephone Encounter (Signed)
LMOVM reminding pt to send remote transmission.   

## 2018-01-20 ENCOUNTER — Encounter: Payer: Self-pay | Admitting: Cardiology

## 2018-01-20 ENCOUNTER — Encounter (HOSPITAL_COMMUNITY)
Admission: RE | Admit: 2018-01-20 | Discharge: 2018-01-20 | Disposition: A | Discharge status: Home / Self Care | Payer: Medicare Other | Source: Ambulatory Visit | Attending: Internal Medicine | Admitting: Internal Medicine

## 2018-01-20 DIAGNOSIS — J9612 Chronic respiratory failure with hypercapnia: Principal | ICD-10-CM

## 2018-01-20 DIAGNOSIS — J9611 Chronic respiratory failure with hypoxia: Secondary | ICD-10-CM | POA: Diagnosis not present

## 2018-01-20 DIAGNOSIS — I5022 Chronic systolic (congestive) heart failure: Secondary | ICD-10-CM

## 2018-01-20 LAB — GLUCOSE, CAPILLARY
GLUCOSE-CAPILLARY: 246 mg/dL — AB (ref 70–99)
Glucose-Capillary: 230 mg/dL — ABNORMAL HIGH (ref 70–99)

## 2018-01-20 NOTE — Progress Notes (Signed)
Daily Session Note  Patient Details  Name: [name] MRN: [id number] Date of Birth: 1945/09/13 Referring Provider:     Pulmonary Rehab Walk Test from 01/11/2018 in [place]  Referring Provider  Dr. Melvyn Novas      Encounter Date: 01/20/2018  Check In: Session Check In - 01/20/18 1618      Check-In   Supervising physician immediately available to respond to emergencies  [facility] immediately available    Physician(s)  Dr. Florene Glen    Location  [facility]    Staff Present  Su Hilt, MS, ACSM RCEP, Exercise Physiologist;Carlette Wilber Oliphant, RN, BSN;Ramon Dredge, RN, MHA    Medication changes reported      No    Fall or balance concerns reported     No    Tobacco Cessation  No Change    Warm-up and Cool-down  Performed as group-led instruction    Resistance Training Performed  Yes    VAD Patient?  No    PAD/SET Patient?  No      Pain Assessment   Currently in Pain?  No/denies    Multiple Pain Sites  No       Capillary Blood Glucose: Results for orders placed or performed during the hospital encounter of 01/20/18 (from the past 24 hour(s))  Glucose, capillary     Status: Abnormal   Collection Time: 01/20/18  2:22 PM  Result Value Ref Range   Glucose-Capillary 246 (H) 70 - 99 mg/dL  Glucose, capillary     Status: Abnormal   Collection Time: 01/20/18  3:25 PM  Result Value Ref Range   Glucose-Capillary 230 (H) 70 - 99 mg/dL      Social History   Tobacco Use  Smoking Status Current Some Day Smoker  . Packs/day: 2.00  . Years: 45.00  . Pack years: 90.00  . Types: Cigarettes  Smokeless Tobacco Never Used  Tobacco Comment   daughter states he cannot get to cigarettes now    Goals Met:  Achieving weight loss Exercise tolerated well Personal goals reviewed  Goals Unmet:  Not Applicable  Comments: Service time is from 1:30p to 3:30p    Dr. Rush Farmer is Medical Director for Pulmonary  Rehab at [facility].

## 2018-01-20 NOTE — Progress Notes (Signed)
Remote ICD transmission.   

## 2018-01-21 DIAGNOSIS — J449 Chronic obstructive pulmonary disease, unspecified: Secondary | ICD-10-CM | POA: Diagnosis not present

## 2018-01-21 DIAGNOSIS — I259 Chronic ischemic heart disease, unspecified: Secondary | ICD-10-CM | POA: Diagnosis not present

## 2018-01-25 ENCOUNTER — Encounter (HOSPITAL_COMMUNITY)
Admission: RE | Admit: 2018-01-25 | Discharge: 2018-01-25 | Disposition: A | Discharge status: Home / Self Care | Payer: Medicare Other | Source: Ambulatory Visit | Attending: Internal Medicine | Admitting: Internal Medicine

## 2018-01-25 ENCOUNTER — Encounter: Payer: Self-pay | Admitting: Neurology

## 2018-01-25 VITALS — Wt 164.5 lb

## 2018-01-25 DIAGNOSIS — J9611 Chronic respiratory failure with hypoxia: Secondary | ICD-10-CM

## 2018-01-25 DIAGNOSIS — I5022 Chronic systolic (congestive) heart failure: Secondary | ICD-10-CM

## 2018-01-25 DIAGNOSIS — J9612 Chronic respiratory failure with hypercapnia: Principal | ICD-10-CM

## 2018-01-25 LAB — GLUCOSE, CAPILLARY
GLUCOSE-CAPILLARY: 250 mg/dL — AB (ref 70–99)
GLUCOSE-CAPILLARY: 249 mg/dL — AB (ref 70–99)

## 2018-01-25 NOTE — Progress Notes (Signed)
Daily Session Note  Patient Details  Name: [name] MRN: [id number] Date of Birth: 1945-07-12 Referring Provider:     Pulmonary Rehab Walk Test from 01/11/2018 in [place]  Referring Provider  Dr. Melvyn Novas      Encounter Date: 01/25/2018  Check In: Session Check In - 01/25/18 1404      Check-In   Supervising physician immediately available to respond to emergencies  [facility] immediately available    Physician(s)  Dr. Florene Glen    Location  [facility]    Staff Present  Su Hilt, MS, ACSM RCEP, Exercise Physiologist;Carlette Wilber Oliphant, RN, BSN;Ramon Dredge, RN, MHA;Olinty Celesta Aver, MS, ACSM CEP, Exercise Physiologist    Medication changes reported      No    Fall or balance concerns reported     No    Tobacco Cessation  No Change    Warm-up and Cool-down  Performed as group-led instruction    Resistance Training Performed  Yes    VAD Patient?  No    PAD/SET Patient?  No      Pain Assessment   Currently in Pain?  No/denies       Capillary Blood Glucose: Results for orders placed or performed during the hospital encounter of 01/25/18 (from the past 24 hour(s))  Glucose, capillary     Status: Abnormal   Collection Time: 01/25/18  2:54 PM  Result Value Ref Range   Glucose-Capillary 249 (H) 70 - 99 mg/dL   POCT Glucose - 01/25/18 1533      POCT Blood Glucose   Pre-Exercise  250 mg/dL    Post-Exercise  249 mg/dL      Exercise Prescription Changes - 01/25/18 1500      Response to Exercise   Blood Pressure (Admit)  98/62    Blood Pressure (Exercise)  120/58    Blood Pressure (Exit)  100/50    Heart Rate (Admit)  62 bpm    Heart Rate (Exercise)  79 bpm    Heart Rate (Exit)  59 bpm    Oxygen Saturation (Admit)  95 %    Oxygen Saturation (Exercise)  96 %    Oxygen Saturation (Exit)  97 %    Rating of Perceived Exertion (Exercise)  12    Perceived Dyspnea (Exercise)  1    Duration  Progress to 45  minutes of aerobic exercise without signs/symptoms of physical distress    Intensity  Other (comment)   40-80% of HRR     Progression   Progression  Continue to progress workloads to maintain intensity without signs/symptoms of physical distress.      Resistance Training   Training Prescription  Yes    Weight  blue bands    Reps  10-15    Time  10 Minutes      Oxygen   Oxygen  Continuous    Liters  3      NuStep   Level  3    SPM  80    Minutes  32    METs  1.4      Track   Laps  9    Minutes  17       Social History   Tobacco Use  Smoking Status Current Some Day Smoker  . Packs/day: 2.00  . Years: 45.00  . Pack years: 90.00  . Types: Cigarettes  Smokeless Tobacco Never Used  Tobacco Comment   daughter states he cannot get to cigarettes  now    Goals Met:  Exercise tolerated well No report of cardiac concerns or symptoms Strength training completed today  Goals Unmet:  Not Applicable  Comments: Service time is from 1330 to 1510    Dr. Rush Farmer is Medical Director for Pulmonary Rehab at [facility].

## 2018-01-26 NOTE — Progress Notes (Signed)
Pulmonary Individual Treatment Plan  Patient Details  Name: ROMELLE REILEY MRN: [id number] Date of Birth: 04/07/1946 Referring Provider:     Pulmonary Rehab Walk Test from 01/11/2018 in [facility]  Referring Provider  Dr. Melvyn Novas      Initial Encounter Date:    Pulmonary Rehab Walk Test from 01/11/2018 in [facility]  Date  01/13/18      Visit Diagnosis: Chronic respiratory failure with hypoxia and hypercapnia (HCC)  Chronic systolic congestive heart failure ([place])  Patient's Home Medications on Admission:   Current Outpatient Medications:  .  apixaban (ELIQUIS) 5 MG TABS tablet, Take 1 tablet (5 mg total) by mouth 2 (two) times daily., Disp: 180 tablet, Rfl: 1 .  arformoterol (BROVANA) 15 MCG/2ML NEBU, Take 2 mLs (15 mcg total) by nebulization 2 (two) times daily for 15 days., Disp: 120 mL, Rfl: 1 .  atorvastatin (LIPITOR) 40 MG tablet, Take 1 tablet (40 mg total) by mouth daily at 6 PM., Disp: 90 tablet, Rfl: 1 .  budesonide (PULMICORT) 0.5 MG/2ML nebulizer solution, Take 2 mLs (0.5 mg total) by nebulization 2 (two) times daily. DX: J44.9, Disp: 120 mL, Rfl: 5 .  carvedilol (COREG) 6.25 MG tablet, Take 1 tablet (6.25 mg total) by mouth 2 (two) times daily with a meal., Disp: 180 tablet, Rfl: 3 .  digoxin (LANOXIN) 0.125 MG tablet, Take 1 tablet (0.125 mg total) by mouth daily., Disp: 90 tablet, Rfl: 3 .  glipiZIDE (GLUCOTROL) 5 MG tablet, Take 5 mg daily before breakfast by mouth., Disp: , Rfl:  .  ipratropium-albuterol (DUONEB) 0.5-2.5 (3) MG/3ML SOLN, Take 3 mLs by nebulization every 4 (four) hours as needed., Disp: 360 mL, Rfl: 0 .  lactulose (CHRONULAC) 10 GM/15ML solution, TAKE 45 MILLILITERS BY MOUTH TWICE A DAY, Disp: 2700 mL, Rfl: 6 .  loratadine (CLARITIN) 10 MG tablet, Take 1 tablet (10 mg total) by mouth daily., Disp: 30 tablet, Rfl: 1 .  losartan (COZAAR) 25 MG tablet, Take 1 tablet (25 mg total) by mouth daily.,  Disp: 30 tablet, Rfl: 6 .  Multiple Vitamin (MULTIVITAMIN WITH MINERALS) TABS tablet, Take 1 tablet by mouth daily., Disp: 30 tablet, Rfl: 2 .  nitroGLYCERIN (NITROSTAT) 0.4 MG SL tablet, Place 1 tablet (0.4 mg total) under the tongue every 5 (five) minutes as needed for chest pain., Disp: 25 tablet, Rfl: 2 .  OXYGEN, Inhale 2.5 L into the lungs continuous. , Disp: , Rfl:  .  senna-docusate (SENOKOT-S) 8.6-50 MG tablet, Take 1 tablet by mouth daily. Hold for diarrhea., Disp: 30 tablet, Rfl: 0 .  spironolactone (ALDACTONE) 25 MG tablet, Take 0.5 tablets (12.5 mg total) by mouth daily., Disp: 15 tablet, Rfl: 6 .  thiamine 100 MG tablet, Take 1 tablet (100 mg total) by mouth daily., Disp: 30 tablet, Rfl: 0 .  torsemide (DEMADEX) 20 MG tablet, Take 4 tablets (80 mg total) by mouth daily., Disp: 120 tablet, Rfl: 6  Past Medical History: Past Medical History:  Diagnosis Date  . AICD (automatic cardioverter/defibrillator) present   . Alcohol abuse   . Anxiety   . Bipolar disorder ([place])   . Biventricular automatic implantable cardioverter defibrillator in situ    a. 01/2014 s/p MDT [id number] Viva XT CRT-D (ser # [id number]).  . CAD (coronary artery disease)    a. s/p prior PCI/stenting of the LCX;  b. 08/2014 MV: EF 20%, large septal, apical, and inferior infarct from apex to base, no ischemia;  c. 08/2014 NSTEMI/Cath: LM mod distal dzs extending into ostial LCX (80%), LAD tortuous, RI nl, OM mod dzs, RCA 100 CTO with R->R and L->R collats-->Med Rx.  . Chronic atrial fibrillation (HCC)    a. CHA2DS2VASc = 6->eliquis;  b. S/P AVN RFCA an BiV ICD placement.  . Chronic respiratory failure ([place])   . Chronic systolic congestive heart failure ([place])    a. 01/2014 Echo: EF 25-30%, mod conc LVH, mod dil LA.  . CKD (chronic kidney disease)    a. suspect stage II-III based on historical labs.  . Complete heart block ([place])    a. In setting of prior AV nodal ablation r/t afib-->BiV ICD (01/2014).  . COPD (chronic  obstructive pulmonary disease) ([place])   . CVA (cerebral vascular accident) ([place])    a. Multiple prior embolic strokes.  . Dementia   . Depression   . DVT (deep venous thrombosis) ([place])   . Dyslipidemia   . Hyperglycemia   . Hypertension   . Mitral valve disease    a. remote mitral replacement with Bjork Shiley valve;  b. 06/2006 Redo MVR with tissue valve.  . Mixed Ischemic and Nonischemic Cardiomyopathy    a. 8/.2015 Echo: Ef 25-30%;  b. 01/2014 s/p MDT MWNU2V2 Pershing Proud XT CRT-D (ser # [id number] H).  . On home oxygen therapy    "2L; 24/7" (65/16/2019)  . Psoriasis    "back of his head; elbows; finger of left hand" (06/07/2017)  . Ventral hernia     Tobacco Use: Social History   Tobacco Use  Smoking Status Current Some Day Smoker  . Packs/day: 2.00  . Years: 45.00  . Pack years: 90.00  . Types: Cigarettes  Smokeless Tobacco Never Used  Tobacco Comment   daughter states he cannot get to cigarettes now    Labs: Recent Review Flowsheet Data    Labs for ITP Cardiac and Pulmonary Rehab Latest Ref Rng & Units 10/24/2017 10/25/2017 10/26/2017 10/26/2017 11/12/2017   Cholestrol 0 - 200 mg/dL - - - - 140   LDLCALC 0 - 99 mg/dL - - - - 53   HDL >40 mg/dL - - - - 37(L)   Trlycerides <150 mg/dL - - - - 252(H)   Hemoglobin A1c 4.8 - 5.6 % - 6.9(H) - - -   PHART 7.350 - 7.450 7.376 7.433 7.415 7.465(H) -   PCO2ART 32.0 - 48.0 mmHg 83.4(HH) 73.6(HH) 69.1(HH) 63.9(H) -   HCO3 20.0 - 28.0 mmol/L 48.0(H) 48.4(H) 43.4(H) 46.0(H) -   TCO2 22 - 32 mmol/L - - - 48(H) -   ACIDBASEDEF 0.0 - 2.0 mmol/L - - - - -   O2SAT % 95.5 96.5 97.4 100.0 -      Capillary Blood Glucose: Lab Results  Component Value Date   GLUCAP 249 (H) 01/25/2018   GLUCAP 250 (H) 01/25/2018   GLUCAP 230 (H) 01/20/2018   GLUCAP 246 (H) 01/20/2018   GLUCAP 234 (H) 11/03/2017   POCT Glucose    Row Name 01/25/18 1533             POCT Blood Glucose   Pre-Exercise  250 mg/dL       Post-Exercise  249 mg/dL           Pulmonary Assessment Scores: Pulmonary Assessment Scores    Row Name 12/27/17 1415         ADL UCSD   ADL Phase  Entry     SOB Score total  24  CAT Score   CAT Score  16        Pulmonary Function Assessment: Pulmonary Function Assessment - 12/27/17 1414      Breath   Bilateral Breath Sounds  Clear;Decreased    Shortness of Breath  No       Exercise Target Goals: Exercise Program Goal: Individual exercise prescription set using results from initial 6 min walk test and THRR while considering  patient's activity barriers and safety.   Exercise Prescription Goal: Initial exercise prescription builds to 30-45 minutes a day of aerobic activity, 2-3 days per week.  Home exercise guidelines will be given to patient during program as part of exercise prescription that the participant will acknowledge.  Activity Barriers & Risk Stratification: Activity Barriers & Cardiac Risk Stratification - 12/27/17 1423      Activity Barriers & Cardiac Risk Stratification   Activity Barriers  Arthritis;Deconditioning;Shortness of Breath;Balance Concerns       6 Minute Walk: 6 Minute Walk    Row Name 01/13/18 0835         6 Minute Walk   Phase  Initial     Distance  1000 feet     Walk Time  6 minutes     # of Rest Breaks  0     MPH  1.89     METS  2.45     RPE  12     Perceived Dyspnea   0     Symptoms  Yes (comment)     Comments  used wheelchair     Resting HR  65 bpm     Resting BP  114/64     Resting Oxygen Saturation   92 %     Exercise Oxygen Saturation  during 6 min walk  91 %     Max Ex. HR  96 bpm     Max Ex. BP  138/64       Interval HR   1 Minute HR  87     2 Minute HR  91     3 Minute HR  91     4 Minute HR  95     5 Minute HR  96     6 Minute HR  92     Interval Heart Rate?  Yes       Interval Oxygen   Interval Oxygen?  Yes     Baseline Oxygen Saturation %  92 %     1 Minute Oxygen Saturation %  91 %     1 Minute Liters of Oxygen  2 L     2  Minute Oxygen Saturation %  93 %     2 Minute Liters of Oxygen  3 L     3 Minute Oxygen Saturation %  93 %     3 Minute Liters of Oxygen  3 L     4 Minute Oxygen Saturation %  93 %     4 Minute Liters of Oxygen  3 L     5 Minute Oxygen Saturation %  95 %     5 Minute Liters of Oxygen  3 L     6 Minute Oxygen Saturation %  95 %     6 Minute Liters of Oxygen  3 L        Oxygen Initial Assessment: Oxygen Initial Assessment - 01/13/18 0829      Initial 6 min Walk   Oxygen Used  Continuous    Liters  per minute  3      Program Oxygen Prescription   Program Oxygen Prescription  Continuous;E-Tanks    Liters per minute  4       Oxygen Re-Evaluation: Oxygen Re-Evaluation    Row Name 01/25/18 0723             Program Oxygen Prescription   Program Oxygen Prescription  Continuous;E-Tanks       Liters per minute  4         Home Oxygen   Home Oxygen Device  Home Concentrator;E-Tanks       Sleep Oxygen Prescription  Continuous       Liters per minute  3       Home Exercise Oxygen Prescription  Continuous       Liters per minute  2       Home at Rest Exercise Oxygen Prescription  Continuous       Liters per minute  2       Compliance with Home Oxygen Use  Yes         Goals/Expected Outcomes   Short Term Goals  To learn and exhibit compliance with exercise, home and travel O2 prescription;To learn and understand importance of monitoring SPO2 with pulse oximeter and demonstrate accurate use of the pulse oximeter.;To learn and understand importance of maintaining oxygen saturations>88%;To learn and demonstrate proper pursed lip breathing techniques or other breathing techniques.;To learn and demonstrate proper use of respiratory medications       Long  Term Goals  Exhibits compliance with exercise, home and travel O2 prescription;Verbalizes importance of monitoring SPO2 with pulse oximeter and return demonstration;Maintenance of O2 saturations>88%;Exhibits proper breathing techniques,  such as pursed lip breathing or other method taught during program session;Compliance with respiratory medication;Demonstrates proper use of MDI's       Goals/Expected Outcomes  compliance and understanding          Oxygen Discharge (Final Oxygen Re-Evaluation): Oxygen Re-Evaluation - 01/25/18 0723      Program Oxygen Prescription   Program Oxygen Prescription  Continuous;E-Tanks    Liters per minute  4      Home Oxygen   Home Oxygen Device  Home Concentrator;E-Tanks    Sleep Oxygen Prescription  Continuous    Liters per minute  3    Home Exercise Oxygen Prescription  Continuous    Liters per minute  2    Home at Rest Exercise Oxygen Prescription  Continuous    Liters per minute  2    Compliance with Home Oxygen Use  Yes      Goals/Expected Outcomes   Short Term Goals  To learn and exhibit compliance with exercise, home and travel O2 prescription;To learn and understand importance of monitoring SPO2 with pulse oximeter and demonstrate accurate use of the pulse oximeter.;To learn and understand importance of maintaining oxygen saturations>88%;To learn and demonstrate proper pursed lip breathing techniques or other breathing techniques.;To learn and demonstrate proper use of respiratory medications    Long  Term Goals  Exhibits compliance with exercise, home and travel O2 prescription;Verbalizes importance of monitoring SPO2 with pulse oximeter and return demonstration;Maintenance of O2 saturations>88%;Exhibits proper breathing techniques, such as pursed lip breathing or other method taught during program session;Compliance with respiratory medication;Demonstrates proper use of MDI's    Goals/Expected Outcomes  compliance and understanding       Initial Exercise Prescription: Initial Exercise Prescription - 01/13/18 0800      Date of Initial Exercise RX and Referring Provider  Date  01/13/18    Referring Provider  Dr. Melvyn Novas      Oxygen   Oxygen  Continuous    Liters  3       NuStep   Level  2    SPM  80    Minutes  17    METs  1.5      Arm Ergometer   Level  2    Minutes  17      Track   Laps  5    Minutes  17      Prescription Details   Frequency (times per week)  2    Duration  Progress to 45 minutes of aerobic exercise without signs/symptoms of physical distress      Intensity   THRR 40-80% of Max Heartrate  60-120    Ratings of Perceived Exertion  11-13    Perceived Dyspnea  0-4      Progression   Progression  Continue progressive overload as per policy without signs/symptoms or physical distress.      Resistance Training   Training Prescription  Yes    Weight  blue bands    Reps  10-15       Perform Capillary Blood Glucose checks as needed.  Exercise Prescription Changes:  Exercise Prescription Changes    Row Name 01/25/18 1500             Response to Exercise   Blood Pressure (Admit)  98/62       Blood Pressure (Exercise)  120/58       Blood Pressure (Exit)  100/50       Heart Rate (Admit)  62 bpm       Heart Rate (Exercise)  79 bpm       Heart Rate (Exit)  59 bpm       Oxygen Saturation (Admit)  95 %       Oxygen Saturation (Exercise)  96 %       Oxygen Saturation (Exit)  97 %       Rating of Perceived Exertion (Exercise)  12       Perceived Dyspnea (Exercise)  1       Duration  Progress to 45 minutes of aerobic exercise without signs/symptoms of physical distress       Intensity  Other (comment) 40-80% of HRR         Progression   Progression  Continue to progress workloads to maintain intensity without signs/symptoms of physical distress.         Resistance Training   Training Prescription  Yes       Weight  blue bands       Reps  10-15       Time  10 Minutes         Oxygen   Oxygen  Continuous       Liters  3         NuStep   Level  3       SPM  80       Minutes  32       METs  1.4         Track   Laps  9       Minutes  17          Exercise Comments:   Exercise Goals and  Review:   Exercise Goals Re-Evaluation : Exercise Goals Re-Evaluation    Row Name 01/25/18 [phone]  Exercise Goal Re-Evaluation   Exercise Goals Review  Increase Physical Activity;Increase Strength and Stamina;Able to understand and use rate of perceived exertion (RPE) scale;Able to understand and use Dyspnea scale;Knowledge and understanding of Target Heart Rate Range (THRR);Understanding of Exercise Prescription       Comments  Patient has only attended 1 rehab session. Will cont. to monitor and progress as able.        Expected Outcomes  Through rehab and exercising at home, patient will find it easier to carry out ADL's and will gain the confidence to establish an exercise routine at home.           Discharge Exercise Prescription (Final Exercise Prescription Changes): Exercise Prescription Changes - 01/25/18 1500      Response to Exercise   Blood Pressure (Admit)  98/62    Blood Pressure (Exercise)  120/58    Blood Pressure (Exit)  100/50    Heart Rate (Admit)  62 bpm    Heart Rate (Exercise)  79 bpm    Heart Rate (Exit)  59 bpm    Oxygen Saturation (Admit)  95 %    Oxygen Saturation (Exercise)  96 %    Oxygen Saturation (Exit)  97 %    Rating of Perceived Exertion (Exercise)  12    Perceived Dyspnea (Exercise)  1    Duration  Progress to 45 minutes of aerobic exercise without signs/symptoms of physical distress    Intensity  Other (comment)   40-80% of HRR     Progression   Progression  Continue to progress workloads to maintain intensity without signs/symptoms of physical distress.      Resistance Training   Training Prescription  Yes    Weight  blue bands    Reps  10-15    Time  10 Minutes      Oxygen   Oxygen  Continuous    Liters  3      NuStep   Level  3    SPM  80    Minutes  32    METs  1.4      Track   Laps  9    Minutes  17       Nutrition:  Target Goals: Understanding of nutrition guidelines, daily intake of sodium '1500mg'$ ,  cholesterol '200mg'$ , calories 30% from fat and 7% or less from saturated fats, daily to have 5 or more servings of fruits and vegetables.  Biometrics: Pre Biometrics - 12/27/17 1424      Pre Biometrics   Grip Strength  28 kg        Nutrition Therapy Plan and Nutrition Goals:   Nutrition Assessments:   Nutrition Goals Re-Evaluation:   Nutrition Goals Discharge (Final Nutrition Goals Re-Evaluation):   Psychosocial: Target Goals: Acknowledge presence or absence of significant depression and/or stress, maximize coping skills, provide positive support system. Participant is able to verbalize types and ability to use techniques and skills needed for reducing stress and depression.  Initial Review & Psychosocial Screening: Initial Psych Review & Screening - 12/27/17 1430      Initial Review   Current issues with  None Identified   dementia     Family Dynamics   Good Support System?  Yes      Barriers   Psychosocial barriers to participate in program  There are no identifiable barriers or psychosocial needs.      Screening Interventions   Interventions  Encouraged to exercise    Expected Outcomes  Short Term  goal: Utilizing psychosocial counselor, staff and physician to assist with identification of specific Stressors or current issues interfering with healing process. Setting desired goal for each stressor or current issue identified.;Long Term Goal: Stressors or current issues are controlled or eliminated.;Short Term goal: Identification and review with participant of any Quality of Life or Depression concerns found by scoring the questionnaire.;Long Term goal: The participant improves quality of Life and PHQ9 Scores as seen by post scores and/or verbalization of changes       Quality of Life Scores:  Scores of 19 and below usually indicate a poorer quality of life in these areas.  A difference of  2-3 points is a clinically meaningful difference.  A difference of 2-3 points  in the total score of the Quality of Life Index has been associated with significant improvement in overall quality of life, self-image, physical symptoms, and general health in studies assessing change in quality of life.  PHQ-9: Recent Review Flowsheet Data    Depression screen [facility] 2/9 12/27/2017   Decreased Interest 0   Down, Depressed, Hopeless 0   PHQ - 2 Score 0     Interpretation of Total Score  Total Score Depression Severity:  1-4 = Minimal depression, 5-9 = Mild depression, 10-14 = Moderate depression, 15-19 = Moderately severe depression, 20-27 = Severe depression   Psychosocial Evaluation and Intervention: Psychosocial Evaluation - 01/26/18 1237      Psychosocial Evaluation & Interventions   Interventions  Encouraged to exercise with the program and follow exercise prescription    Comments  supportive family that accompanies him to rehab, working on having family bring pt meter to exercise    Expected Outcomes  No barriers to participation in pulmonary rehab    Continue Psychosocial Services   Follow up required by staff       Psychosocial Re-Evaluation: Psychosocial Re-Evaluation    [place] Name 01/26/18 1238             Psychosocial Re-Evaluation   Current issues with  None Identified       Comments  no identifiable barriers       Expected Outcomes  Pt will continue to display mental well being and hopeful outllook on his future       Interventions  Relaxation education;Encouraged to attend Pulmonary Rehabilitation for the exercise;Stress management education       Continue Psychosocial Services   Follow up required by staff          Psychosocial Discharge (Final Psychosocial Re-Evaluation): Psychosocial Re-Evaluation - 01/26/18 1238      Psychosocial Re-Evaluation   Current issues with  None Identified    Comments  no identifiable barriers    Expected Outcomes  Pt will continue to display mental well being and hopeful outllook on his future    Interventions   Relaxation education;Encouraged to attend Pulmonary Rehabilitation for the exercise;Stress management education    Continue Psychosocial Services   Follow up required by staff       Education: Education Goals: Education classes will be provided on a weekly basis, covering required topics. Participant will state understanding/return demonstration of topics presented.  Learning Barriers/Preferences: Learning Barriers/Preferences - 12/27/17 1412      Learning Barriers/Preferences   Learning Barriers  Sight;Reading;Hearing    Learning Preferences  Audio;Pictoral       Education Topics: Risk Factor Reduction:  -Group instruction that is supported by a PowerPoint presentation. Instructor discusses the definition of a risk factor, different risk factors for pulmonary  disease, and how the heart and lungs work together.     Nutrition for Pulmonary Patient:  -Group instruction provided by PowerPoint slides, verbal discussion, and written materials to support subject matter. The instructor gives an explanation and review of healthy diet recommendations, which includes a discussion on weight management, recommendations for fruit and vegetable consumption, as well as protein, fluid, caffeine, fiber, sodium, sugar, and alcohol. Tips for eating when patients are short of breath are discussed.   Pursed Lip Breathing:  -Group instruction that is supported by demonstration and informational handouts. Instructor discusses the benefits of pursed lip and diaphragmatic breathing and detailed demonstration on how to preform both.     Oxygen Safety:  -Group instruction provided by PowerPoint, verbal discussion, and written material to support subject matter. There is an overview of "What is Oxygen" and "Why do we need it".  Instructor also reviews how to create a safe environment for oxygen use, the importance of using oxygen as prescribed, and the risks of noncompliance. There is a brief discussion on  traveling with oxygen and resources the patient may utilize.   Oxygen Equipment:  -Group instruction provided by [facility] Staff utilizing handouts, written materials, and equipment demonstrations.   Signs and Symptoms:  -Group instruction provided by written material and verbal discussion to support subject matter. Warning signs and symptoms of infection, stroke, and heart attack are reviewed and when to call the physician/911 reinforced. Tips for preventing the spread of infection discussed.   Advanced Directives:  -Group instruction provided by verbal instruction and written material to support subject matter. Instructor reviews Advanced Directive laws and proper instruction for filling out document.   Pulmonary Video:  -Group video education that reviews the importance of medication and oxygen compliance, exercise, good nutrition, pulmonary hygiene, and pursed lip and diaphragmatic breathing for the pulmonary patient.   Exercise for the Pulmonary Patient:  -Group instruction that is supported by a PowerPoint presentation. Instructor discusses benefits of exercise, core components of exercise, frequency, duration, and intensity of an exercise routine, importance of utilizing pulse oximetry during exercise, safety while exercising, and options of places to exercise outside of rehab.     Pulmonary Medications:  -Verbally interactive group education provided by instructor with focus on inhaled medications and proper administration.   Anatomy and Physiology of the Respiratory System and Intimacy:  -Group instruction provided by PowerPoint, verbal discussion, and written material to support subject matter. Instructor reviews respiratory cycle and anatomical components of the respiratory system and their functions. Instructor also reviews differences in obstructive and restrictive respiratory diseases with examples of each. Intimacy, Sex, and Sexuality differences are reviewed with a  discussion on how relationships can change when diagnosed with pulmonary disease. Common sexual concerns are reviewed.   MD DAY -A group question and answer session with a medical doctor that allows participants to ask questions that relate to their pulmonary disease state.   OTHER EDUCATION -Group or individual verbal, written, or video instructions that support the educational goals of the pulmonary rehab program.   Holiday Eating Survival Tips:  -Group instruction provided by PowerPoint slides, verbal discussion, and written materials to support subject matter. The instructor gives patients tips, tricks, and techniques to help them not only survive but enjoy the holidays despite the onslaught of food that accompanies the holidays.   Knowledge Questionnaire Score: Knowledge Questionnaire Score - 12/27/17 1413      Knowledge Questionnaire Score   Pre Score  12/18  Core Components/Risk Factors/Patient Goals at Admission: Personal Goals and Risk Factors at Admission - 12/27/17 1424      Core Components/Risk Factors/Patient Goals on Admission   Tobacco Cessation  Yes   daughter says he cannot get to cigarettes, has only had 1 pack in a year   Intervention  Assist the participant in steps to quit. Provide individualized education and counseling about committing to Tobacco Cessation, relapse prevention, and pharmacological support that can be provided by physician.;Advice worker, assist with locating and accessing local/national Quit Smoking programs, and support quit date choice.    Expected Outcomes  Short Term: Will demonstrate readiness to quit, by selecting a quit date.    Improve shortness of breath with ADL's  Yes    Intervention  Provide education, individualized exercise plan and daily activity instruction to help decrease symptoms of SOB with activities of daily living.    Expected Outcomes  Short Term: Improve cardiorespiratory fitness to achieve a  reduction of symptoms when performing ADLs;Long Term: Be able to perform more ADLs without symptoms or delay the onset of symptoms    Diabetes  Yes    Intervention  Provide education about signs/symptoms and action to take for hypo/hyperglycemia.;Provide education about proper nutrition, including hydration, and aerobic/resistive exercise prescription along with prescribed medications to achieve blood glucose in normal ranges: Fasting glucose 65-99 mg/dL    Expected Outcomes  Short Term: Participant verbalizes understanding of the signs/symptoms and immediate care of hyper/hypoglycemia, proper foot care and importance of medication, aerobic/resistive exercise and nutrition plan for blood glucose control.;Long Term: Attainment of HbA1C < 7%.       Core Components/Risk Factors/Patient Goals Review:  Goals and Risk Factor Review    Row Name 12/27/17 1427 01/26/18 1240           Core Components/Risk Factors/Patient Goals Review   Personal Goals Review  Improve shortness of breath with ADL's;Tobacco Cessation;Diabetes  Improve shortness of breath with ADL's;Tobacco Cessation;Develop more efficient breathing techniques such as purse lipped breathing and diaphragmatic breathing and practicing self-pacing with activity.;Diabetes;Increase knowledge of respiratory medications and ability to use respiratory devices properly.      Review  The most interested in improving deconditioning and increase strength  Pt with delayed start of exercise.  Pt has completed 2 exercise session from 01/20/18.  Pt reports no change in his tobacco usage. Pt reports minimal shortness of breath on exertion.  Pt is encouraged to maintain his pace of exercise as he often will unknowingly slow down but with reminders he will resume at a higher pace      Expected Outcomes  Improved strength and conditioning  See Admission Goals/Outcomes         Core Components/Risk Factors/Patient Goals at Discharge (Final Review):  Goals and Risk  Factor Review - 01/26/18 1240      Core Components/Risk Factors/Patient Goals Review   Personal Goals Review  Improve shortness of breath with ADL's;Tobacco Cessation;Develop more efficient breathing techniques such as purse lipped breathing and diaphragmatic breathing and practicing self-pacing with activity.;Diabetes;Increase knowledge of respiratory medications and ability to use respiratory devices properly.    Review  Pt with delayed start of exercise.  Pt has completed 2 exercise session from 01/20/18.  Pt reports no change in his tobacco usage. Pt reports minimal shortness of breath on exertion.  Pt is encouraged to maintain his pace of exercise as he often will unknowingly slow down but with reminders he will resume at a higher pace  Expected Outcomes  See Admission Goals/Outcomes       ITP Comments: ITP Comments    Row Name 01/26/18 1236           ITP Comments  Dr. Manfred Arch, Medical Director           Comments:  Pt completed 2 exercise sessions since 01/20/18. Cherre Huger, BSN Cardiac and Training and development officer

## 2018-01-27 ENCOUNTER — Telehealth: Payer: Self-pay

## 2018-01-27 ENCOUNTER — Encounter (HOSPITAL_COMMUNITY): Payer: Medicare Other

## 2018-01-27 NOTE — Telephone Encounter (Signed)
-----   Message from Van Clines, MD sent at 01/24/2018  9:56 AM EDT ----- Pls let him know thyroid and B12 levels are normal. Thanks

## 2018-01-27 NOTE — Telephone Encounter (Signed)
LMOM relaying normal thyroid and b12 results.

## 2018-02-01 ENCOUNTER — Encounter (HOSPITAL_COMMUNITY)
Admission: RE | Admit: 2018-02-01 | Discharge: 2018-02-01 | Disposition: A | Discharge status: Home / Self Care | Payer: Medicare Other | Source: Ambulatory Visit | Attending: Internal Medicine | Admitting: Internal Medicine

## 2018-02-01 DIAGNOSIS — J9611 Chronic respiratory failure with hypoxia: Secondary | ICD-10-CM | POA: Diagnosis not present

## 2018-02-01 DIAGNOSIS — I5022 Chronic systolic (congestive) heart failure: Secondary | ICD-10-CM

## 2018-02-01 DIAGNOSIS — J9612 Chronic respiratory failure with hypercapnia: Principal | ICD-10-CM

## 2018-02-01 LAB — GLUCOSE, CAPILLARY: Glucose-Capillary: 394 mg/dL — ABNORMAL HIGH (ref 70–99)

## 2018-02-01 NOTE — Progress Notes (Signed)
Incomplete Session Note  Patient Details  Name: [name] MRN: [id number] Date of Birth: 1946-01-07 Referring Provider:     Pulmonary Rehab Walk Test from 01/11/2018 in [facility]  Referring Provider  Dr. Roque Cash did not complete his rehab session.  Pt arrived to pulmonary rehab accompanied by his granddaughter.  Pt granddaughter did bring meter to exercise this time.  Pre exercise blood glucose was 395.  Pt granddaughter stated that she checked it that morning and it was 325.  Pt was given his am medications which consisted of glipizide 5 mg.  Pt ate breakfast grits, eggs and toast.  Pt granddaughter gave him a second glipizide in hopes it would bring his glucose reading down before exercise. Pt ate lunch roast beef sandwich. Interview with granddaughter revealed inconsistency in check blood glucose on a routine basis.  Pt advised that he would not be able to exercise today due to pulmonary rehab protocol. Pt will need improve management of his diabetes. Pt primary MD, Dr. Eula Listen office called and appt made for pt to be seen on 8/28 at 10:30.  Pt given contact information, for rehab to to update staff on the plan of care.  Pt given parameters for returning to rehab. If pt blood glucose is 285 at home fasting, he is not to come in for exercise.  Pt and granddaughter instructed that he will need to be consistent with monitoring his blood glucose at home.  Verbalized understanding. [name], BSN Cardiac and Emergency planning/management officer

## 2018-02-02 DIAGNOSIS — E1165 Type 2 diabetes mellitus with hyperglycemia: Secondary | ICD-10-CM | POA: Diagnosis not present

## 2018-02-03 ENCOUNTER — Encounter: Payer: Self-pay | Admitting: Internal Medicine

## 2018-02-03 ENCOUNTER — Encounter (HOSPITAL_COMMUNITY)
Admission: RE | Admit: 2018-02-03 | Discharge: 2018-02-03 | Disposition: A | Discharge status: Home / Self Care | Payer: Medicare Other | Source: Ambulatory Visit | Attending: Internal Medicine | Admitting: Internal Medicine

## 2018-02-03 DIAGNOSIS — I259 Chronic ischemic heart disease, unspecified: Secondary | ICD-10-CM | POA: Diagnosis not present

## 2018-02-03 DIAGNOSIS — J449 Chronic obstructive pulmonary disease, unspecified: Secondary | ICD-10-CM | POA: Diagnosis not present

## 2018-02-03 DIAGNOSIS — J9611 Chronic respiratory failure with hypoxia: Secondary | ICD-10-CM | POA: Diagnosis not present

## 2018-02-03 DIAGNOSIS — I5022 Chronic systolic (congestive) heart failure: Secondary | ICD-10-CM

## 2018-02-03 DIAGNOSIS — J9612 Chronic respiratory failure with hypercapnia: Secondary | ICD-10-CM | POA: Diagnosis not present

## 2018-02-03 LAB — GLUCOSE, CAPILLARY: GLUCOSE-CAPILLARY: 170 mg/dL — AB (ref 70–99)

## 2018-02-03 NOTE — Progress Notes (Signed)
Daily Session Note  Patient Details  Name: [name] MRN: [id number] Date of Birth: 15-Sep-1945 Referring Provider:     Pulmonary Rehab Walk Test from 01/11/2018 in [place]  Referring Provider  Dr. Melvyn Novas      Encounter Date: 02/03/2018  Check In:   Capillary Blood Glucose: Results for orders placed or performed during the hospital encounter of 02/03/18 (from the past 24 hour(s))  Glucose, capillary     Status: Abnormal   Collection Time: 02/03/18  3:54 PM  Result Value Ref Range   Glucose-Capillary 170 (H) 70 - 99 mg/dL      Social History   Tobacco Use  Smoking Status Current Some Day Smoker  . Packs/day: 2.00  . Years: 45.00  . Pack years: 90.00  . Types: Cigarettes  Smokeless Tobacco Never Used  Tobacco Comment   daughter states he cannot get to cigarettes now    Goals Met:  Exercise tolerated well  Goals Unmet:  Not Applicable  Comments: Service time is from 1:30p to 3:45p    Dr. Rush Farmer is Medical Director for Pulmonary Rehab at [facility].

## 2018-02-05 DIAGNOSIS — E785 Hyperlipidemia, unspecified: Secondary | ICD-10-CM | POA: Diagnosis not present

## 2018-02-05 DIAGNOSIS — E119 Type 2 diabetes mellitus without complications: Secondary | ICD-10-CM | POA: Diagnosis not present

## 2018-02-05 DIAGNOSIS — I10 Essential (primary) hypertension: Secondary | ICD-10-CM | POA: Diagnosis not present

## 2018-02-05 DIAGNOSIS — I259 Chronic ischemic heart disease, unspecified: Secondary | ICD-10-CM | POA: Diagnosis not present

## 2018-02-05 DIAGNOSIS — I5043 Acute on chronic combined systolic (congestive) and diastolic (congestive) heart failure: Secondary | ICD-10-CM | POA: Diagnosis not present

## 2018-02-08 ENCOUNTER — Encounter (HOSPITAL_COMMUNITY)
Admission: RE | Admit: 2018-02-08 | Discharge: 2018-02-08 | Disposition: A | Discharge status: Home / Self Care | Payer: Medicare Other | Source: Ambulatory Visit | Attending: Internal Medicine | Admitting: Internal Medicine

## 2018-02-08 VITALS — Wt 163.6 lb

## 2018-02-08 DIAGNOSIS — J9612 Chronic respiratory failure with hypercapnia: Secondary | ICD-10-CM | POA: Insufficient documentation

## 2018-02-08 DIAGNOSIS — I259 Chronic ischemic heart disease, unspecified: Secondary | ICD-10-CM | POA: Diagnosis not present

## 2018-02-08 DIAGNOSIS — J9611 Chronic respiratory failure with hypoxia: Secondary | ICD-10-CM

## 2018-02-08 DIAGNOSIS — I5022 Chronic systolic (congestive) heart failure: Secondary | ICD-10-CM

## 2018-02-08 DIAGNOSIS — J449 Chronic obstructive pulmonary disease, unspecified: Secondary | ICD-10-CM | POA: Diagnosis not present

## 2018-02-08 LAB — GLUCOSE, CAPILLARY: GLUCOSE-CAPILLARY: 188 mg/dL — AB (ref 70–99)

## 2018-02-08 NOTE — Progress Notes (Signed)
Daily Session Note  Patient Details  Name: [name] MRN: [id number] Date of Birth: 1945-08-14 Referring Provider:     Pulmonary Rehab Walk Test from 01/11/2018 in [place]  Referring Provider  Dr. Melvyn Novas      Encounter Date: 02/08/2018  Check In: Session Check In - 02/08/18 1544      Check-In   Supervising physician immediately available to respond to emergencies  [facility] immediately available    Physician(s)  Dr. Karleen Hampshire    Location  [facility]    Staff Present  Maurice Small, RN, BSN;Molly DiVincenzo, MS, ACSM RCEP, Exercise Physiologist;Coren Sagan Ysidro Evert, RN;Maria Whitaker, RN, BSN    Medication changes reported      No    Fall or balance concerns reported     No    Tobacco Cessation  No Change    Warm-up and Cool-down  Performed as group-led Higher education careers adviser Performed  Yes    VAD Patient?  No    PAD/SET Patient?  No      Pain Assessment   Currently in Pain?  No/denies    Multiple Pain Sites  No       Capillary Blood Glucose: Results for orders placed or performed during the hospital encounter of 02/08/18 (from the past 24 hour(s))  Glucose, capillary     Status: Abnormal   Collection Time: 02/08/18  3:14 PM  Result Value Ref Range   Glucose-Capillary 188 (H) 70 - 99 mg/dL   POCT Glucose - 02/08/18 1601      POCT Blood Glucose   Pre-Exercise  186 mg/dL    Post-Exercise  188 mg/dL        Social History   Tobacco Use  Smoking Status Current Some Day Smoker  . Packs/day: 2.00  . Years: 45.00  . Pack years: 90.00  . Types: Cigarettes  Smokeless Tobacco Never Used  Tobacco Comment   daughter states he cannot get to cigarettes now    Goals Met:  Exercise tolerated well No report of cardiac concerns or symptoms Strength training completed today  Goals Unmet:  Not Applicable  Comments: Service time is from 1330 to 1515    Dr. Rush Farmer is Medical Director for  Pulmonary Rehab at [facility].

## 2018-02-10 ENCOUNTER — Encounter (HOSPITAL_COMMUNITY)
Admission: RE | Admit: 2018-02-10 | Discharge: 2018-02-10 | Disposition: A | Discharge status: Home / Self Care | Payer: Medicare Other | Source: Ambulatory Visit | Attending: Internal Medicine | Admitting: Internal Medicine

## 2018-02-10 DIAGNOSIS — I5022 Chronic systolic (congestive) heart failure: Secondary | ICD-10-CM

## 2018-02-10 DIAGNOSIS — J9612 Chronic respiratory failure with hypercapnia: Secondary | ICD-10-CM | POA: Diagnosis not present

## 2018-02-10 DIAGNOSIS — J9611 Chronic respiratory failure with hypoxia: Secondary | ICD-10-CM

## 2018-02-10 NOTE — Progress Notes (Signed)
Daily Session Note  Patient Details  Name: [name] MRN: [id number] Date of Birth: [birth year] Referring Provider:     Pulmonary Rehab Walk Test from 01/11/2018 in [place]  Referring Provider  Dr. Melvyn Novas      Encounter Date: 02/10/2018  Check In: Session Check In - 02/10/18 1515      Check-In   Supervising physician immediately available to respond to emergencies  [facility] immediately available    Physician(s)  Dr. Lars Mage     Location  [facility]    Staff Present  Rodney Langton, RN;Joann Rion, RN, BSN;Carlette Wilber Oliphant, RN, BSN;Ramon Dredge, RN, MHA    Medication changes reported      No    Fall or balance concerns reported     No    Tobacco Cessation  No Change    Warm-up and Cool-down  Performed as group-led instruction    Resistance Training Performed  Yes    VAD Patient?  No    PAD/SET Patient?  No      Pain Assessment   Currently in Pain?  No/denies       Capillary Blood Glucose: No results found for this or any previous visit (from the past 24 hour(s)).    Social History   Tobacco Use  Smoking Status Current Some Day Smoker  . Packs/day: 2.00  . Years: 45.00  . Pack years: 90.00  . Types: Cigarettes  Smokeless Tobacco Never Used  Tobacco Comment   daughter states he cannot get to cigarettes now    Goals Met:  Exercise tolerated well No report of cardiac concerns or symptoms Strength training completed today  Goals Unmet:  Not Applicable  Comments: Service time is from 1330 to 1505    Dr. Rush Farmer is Medical Director for Pulmonary Rehab at [facility].

## 2018-02-11 ENCOUNTER — Ambulatory Visit: Payer: Self-pay | Admitting: Internal Medicine

## 2018-02-15 ENCOUNTER — Telehealth (HOSPITAL_COMMUNITY): Payer: Self-pay | Admitting: Internal Medicine

## 2018-02-15 ENCOUNTER — Encounter (HOSPITAL_COMMUNITY): Payer: Medicare Other

## 2018-02-15 DIAGNOSIS — J449 Chronic obstructive pulmonary disease, unspecified: Secondary | ICD-10-CM | POA: Diagnosis not present

## 2018-02-15 DIAGNOSIS — I259 Chronic ischemic heart disease, unspecified: Secondary | ICD-10-CM | POA: Diagnosis not present

## 2018-02-17 ENCOUNTER — Encounter (HOSPITAL_COMMUNITY): Payer: Medicare Other

## 2018-02-17 ENCOUNTER — Telehealth (HOSPITAL_COMMUNITY): Payer: Self-pay | Admitting: Internal Medicine

## 2018-02-17 DIAGNOSIS — E119 Type 2 diabetes mellitus without complications: Secondary | ICD-10-CM | POA: Diagnosis not present

## 2018-02-19 LAB — CUP PACEART REMOTE DEVICE CHECK
Battery Remaining Longevity: 23 mo
Battery Voltage: 2.92 V
Brady Statistic AP VP Percent: 0 %
Brady Statistic AS VP Percent: 94.81 %
Brady Statistic RA Percent Paced: 0 %
Date Time Interrogation Session: 20190815042211
HighPow Impedance: 44 Ohm
HighPow Impedance: 56 Ohm
Implantable Lead Implant Date: 20060307
Implantable Lead Implant Date: 20080110
Implantable Lead Implant Date: 20090617
Implantable Lead Location: 753858
Implantable Lead Model: 5076
Implantable Lead Model: 6947
Lead Channel Impedance Value: 399 Ohm
Lead Channel Impedance Value: 4047 Ohm
Lead Channel Impedance Value: 4047 Ohm
Lead Channel Impedance Value: 570 Ohm
Lead Channel Pacing Threshold Amplitude: 0.625 V
Lead Channel Pacing Threshold Amplitude: 0.875 V
Lead Channel Pacing Threshold Pulse Width: 0.4 ms
Lead Channel Pacing Threshold Pulse Width: 0.4 ms
Lead Channel Pacing Threshold Pulse Width: 0.4 ms
Lead Channel Sensing Intrinsic Amplitude: 0.75 mV
Lead Channel Sensing Intrinsic Amplitude: 0.75 mV
Lead Channel Sensing Intrinsic Amplitude: 24.5 mV
Lead Channel Setting Pacing Amplitude: 2.5 V
Lead Channel Setting Pacing Amplitude: 2.5 V
Lead Channel Setting Pacing Pulse Width: 0.4 ms
Lead Channel Setting Pacing Pulse Width: 0.4 ms
MDC IDC LEAD LOCATION: 753859
MDC IDC LEAD LOCATION: 753860
MDC IDC MSMT LEADCHNL LV PACING THRESHOLD AMPLITUDE: 1.125 V
MDC IDC MSMT LEADCHNL RV IMPEDANCE VALUE: 627 Ohm
MDC IDC MSMT LEADCHNL RV IMPEDANCE VALUE: 646 Ohm
MDC IDC MSMT LEADCHNL RV SENSING INTR AMPL: 24.5 mV
MDC IDC PG IMPLANT DT: 20150827
MDC IDC SET LEADCHNL RV SENSING SENSITIVITY: 0.45 mV
MDC IDC STAT BRADY AP VS PERCENT: 0 %
MDC IDC STAT BRADY AS VS PERCENT: 5.19 %
MDC IDC STAT BRADY RV PERCENT PACED: 96.48 %

## 2018-02-21 ENCOUNTER — Ambulatory Visit (INDEPENDENT_AMBULATORY_CARE_PROVIDER_SITE_OTHER): Payer: Medicare Other | Admitting: *Deleted

## 2018-02-21 DIAGNOSIS — I5022 Chronic systolic (congestive) heart failure: Secondary | ICD-10-CM | POA: Diagnosis not present

## 2018-02-21 DIAGNOSIS — I482 Chronic atrial fibrillation, unspecified: Secondary | ICD-10-CM

## 2018-02-21 DIAGNOSIS — I259 Chronic ischemic heart disease, unspecified: Secondary | ICD-10-CM | POA: Diagnosis not present

## 2018-02-21 DIAGNOSIS — J449 Chronic obstructive pulmonary disease, unspecified: Secondary | ICD-10-CM | POA: Diagnosis not present

## 2018-02-21 NOTE — Progress Notes (Signed)
Remote ICD transmission.   

## 2018-02-22 ENCOUNTER — Encounter (HOSPITAL_COMMUNITY)
Admission: RE | Admit: 2018-02-22 | Discharge: 2018-02-22 | Disposition: A | Discharge status: Home / Self Care | Payer: Medicare Other | Source: Ambulatory Visit | Attending: Internal Medicine | Admitting: Internal Medicine

## 2018-02-22 VITALS — Wt 166.4 lb

## 2018-02-22 DIAGNOSIS — J9612 Chronic respiratory failure with hypercapnia: Secondary | ICD-10-CM | POA: Diagnosis not present

## 2018-02-22 DIAGNOSIS — J9611 Chronic respiratory failure with hypoxia: Secondary | ICD-10-CM | POA: Diagnosis not present

## 2018-02-22 DIAGNOSIS — I5022 Chronic systolic (congestive) heart failure: Secondary | ICD-10-CM

## 2018-02-22 NOTE — Progress Notes (Signed)
Daily Session Note  Patient Details  Name: [name] MRN: [id number] Date of Birth: 1946-05-29 Referring Provider:     Pulmonary Rehab Walk Test from 01/11/2018 in [place]  Referring Provider  Dr. Melvyn Novas      Encounter Date: 02/22/2018  Check In: Session Check In - 02/22/18 1539      Check-In   Supervising physician immediately available to respond to emergencies  [facility] immediately available    Physician(s)  Dr. Sloan Leiter    Location  [facility]    Staff Present  Maurice Small, RN, BSN;Molly DiVincenzo, MS, ACSM RCEP, Exercise Physiologist;Quincie Haroon Ysidro Evert, Felipe Drone, RN, MHA    Medication changes reported      No    Fall or balance concerns reported     No    Tobacco Cessation  No Change    Warm-up and Cool-down  Performed as group-led instruction    Resistance Training Performed  Yes    VAD Patient?  No    PAD/SET Patient?  No      Pain Assessment   Currently in Pain?  No/denies    Multiple Pain Sites  No       Capillary Blood Glucose: No results found for this or any previous visit (from the past 24 hour(s)). POCT Glucose - 02/22/18 1556      POCT Blood Glucose   Pre-Exercise  200 mg/dL      Exercise Prescription Changes - 02/22/18 1500      Response to Exercise   Blood Pressure (Admit)  112/70    Blood Pressure (Exercise)  142/82    Blood Pressure (Exit)  104/64    Heart Rate (Admit)  66 bpm    Heart Rate (Exercise)  102 bpm    Heart Rate (Exit)  60 bpm    Oxygen Saturation (Admit)  96 %    Oxygen Saturation (Exercise)  95 %    Oxygen Saturation (Exit)  98 %    Rating of Perceived Exertion (Exercise)  12    Perceived Dyspnea (Exercise)  2    Duration  Progress to 45 minutes of aerobic exercise without signs/symptoms of physical distress    Intensity  THRR unchanged      Progression   Progression  Continue to progress workloads to maintain intensity without signs/symptoms of  physical distress.      Resistance Training   Training Prescription  Yes    Weight  blue bands    Reps  10-15    Time  10 Minutes      Oxygen   Oxygen  Continuous    Liters  2      NuStep   Level  2    SPM  80    Minutes  17    METs  1.5      Arm Ergometer   Level  3    Minutes  17      Track   Laps  7    Minutes  17       Social History   Tobacco Use  Smoking Status Current Some Day Smoker  . Packs/day: 2.00  . Years: 45.00  . Pack years: 90.00  . Types: Cigarettes  Smokeless Tobacco Never Used  Tobacco Comment   daughter states he cannot get to cigarettes now    Goals Met:  Exercise tolerated well No report of cardiac concerns or symptoms Strength training completed today  Goals Unmet:  Not Applicable  Comments: Service time is from 1330 to 1515    Dr. Rush Farmer is Medical Director for Pulmonary Rehab at [facility].

## 2018-02-24 ENCOUNTER — Encounter (HOSPITAL_COMMUNITY): Payer: Medicare Other

## 2018-02-24 NOTE — Progress Notes (Signed)
Pulmonary Individual Treatment Plan  Patient Details  Name: ROMELLE REILEY MRN: [id number] Date of Birth: 04/07/1946 Referring Provider:     Pulmonary Rehab Walk Test from 01/11/2018 in [facility]  Referring Provider  Dr. Melvyn Novas      Initial Encounter Date:    Pulmonary Rehab Walk Test from 01/11/2018 in [place]  Date  01/13/18      Visit Diagnosis: Chronic respiratory failure with hypoxia and hypercapnia (HCC)  Chronic systolic congestive heart failure ([place])  Patient's Home Medications on Admission:   Current Outpatient Medications:  .  apixaban (ELIQUIS) 5 MG TABS tablet, Take 1 tablet (5 mg total) by mouth 2 (two) times daily., Disp: 180 tablet, Rfl: 1 .  arformoterol (BROVANA) 15 MCG/2ML NEBU, Take 2 mLs (15 mcg total) by nebulization 2 (two) times daily for 15 days., Disp: 120 mL, Rfl: 1 .  atorvastatin (LIPITOR) 40 MG tablet, Take 1 tablet (40 mg total) by mouth daily at 6 PM., Disp: 90 tablet, Rfl: 1 .  budesonide (PULMICORT) 0.5 MG/2ML nebulizer solution, Take 2 mLs (0.5 mg total) by nebulization 2 (two) times daily. DX: J44.9, Disp: 120 mL, Rfl: 5 .  carvedilol (COREG) 6.25 MG tablet, Take 1 tablet (6.25 mg total) by mouth 2 (two) times daily with a meal., Disp: 180 tablet, Rfl: 3 .  digoxin (LANOXIN) 0.125 MG tablet, Take 1 tablet (0.125 mg total) by mouth daily., Disp: 90 tablet, Rfl: 3 .  glipiZIDE (GLUCOTROL) 5 MG tablet, Take 5 mg daily before breakfast by mouth., Disp: , Rfl:  .  ipratropium-albuterol (DUONEB) 0.5-2.5 (3) MG/3ML SOLN, Take 3 mLs by nebulization every 4 (four) hours as needed., Disp: 360 mL, Rfl: 0 .  lactulose (CHRONULAC) 10 GM/15ML solution, TAKE 45 MILLILITERS BY MOUTH TWICE A DAY, Disp: 2700 mL, Rfl: 6 .  loratadine (CLARITIN) 10 MG tablet, Take 1 tablet (10 mg total) by mouth daily., Disp: 30 tablet, Rfl: 1 .  losartan (COZAAR) 25 MG tablet, Take 1 tablet (25 mg total) by mouth daily.,  Disp: 30 tablet, Rfl: 6 .  Multiple Vitamin (MULTIVITAMIN WITH MINERALS) TABS tablet, Take 1 tablet by mouth daily., Disp: 30 tablet, Rfl: 2 .  nitroGLYCERIN (NITROSTAT) 0.4 MG SL tablet, Place 1 tablet (0.4 mg total) under the tongue every 5 (five) minutes as needed for chest pain., Disp: 25 tablet, Rfl: 2 .  OXYGEN, Inhale 2.5 L into the lungs continuous. , Disp: , Rfl:  .  senna-docusate (SENOKOT-S) 8.6-50 MG tablet, Take 1 tablet by mouth daily. Hold for diarrhea., Disp: 30 tablet, Rfl: 0 .  spironolactone (ALDACTONE) 25 MG tablet, Take 0.5 tablets (12.5 mg total) by mouth daily., Disp: 15 tablet, Rfl: 6 .  thiamine 100 MG tablet, Take 1 tablet (100 mg total) by mouth daily., Disp: 30 tablet, Rfl: 0 .  torsemide (DEMADEX) 20 MG tablet, Take 4 tablets (80 mg total) by mouth daily., Disp: 120 tablet, Rfl: 6  Past Medical History: Past Medical History:  Diagnosis Date  . AICD (automatic cardioverter/defibrillator) present   . Alcohol abuse   . Anxiety   . Bipolar disorder ([place])   . Biventricular automatic implantable cardioverter defibrillator in situ    a. 01/2014 s/p MDT [id number] Viva XT CRT-D (ser # [id number]).  . CAD (coronary artery disease)    a. s/p prior PCI/stenting of the LCX;  b. 08/2014 MV: EF 20%, large septal, apical, and inferior infarct from apex to base, no ischemia;  c. 08/2014 NSTEMI/Cath: LM mod distal dzs extending into ostial LCX (80%), LAD tortuous, RI nl, OM mod dzs, RCA 100 CTO with R->R and L->R collats-->Med Rx.  . Chronic atrial fibrillation (HCC)    a. CHA2DS2VASc = 6->eliquis;  b. S/P AVN RFCA an BiV ICD placement.  . Chronic respiratory failure ([place])   . Chronic systolic congestive heart failure ([place])    a. 01/2014 Echo: EF 25-30%, mod conc LVH, mod dil LA.  . CKD (chronic kidney disease)    a. suspect stage II-III based on historical labs.  . Complete heart block ([place])    a. In setting of prior AV nodal ablation r/t afib-->BiV ICD (01/2014).  . COPD (chronic  obstructive pulmonary disease) ([place])   . CVA (cerebral vascular accident) ([place])    a. Multiple prior embolic strokes.  . Dementia   . Depression   . DVT (deep venous thrombosis) ([place])   . Dyslipidemia   . Hyperglycemia   . Hypertension   . Mitral valve disease    a. remote mitral replacement with Bjork Shiley valve;  b. 06/2006 Redo MVR with tissue valve.  . Mixed Ischemic and Nonischemic Cardiomyopathy    a. 8/.2015 Echo: Ef 25-30%;  b. 01/2014 s/p MDT [id number] Pershing Proud XT CRT-D (ser # [id number] H).  . On home oxygen therapy    "2L; 24/7" (65/16/2019)  . Psoriasis    "back of his head; elbows; finger of left hand" (06/07/2017)  . Ventral hernia     Tobacco Use: Social History   Tobacco Use  Smoking Status Current Some Day Smoker  . Packs/day: 2.00  . Years: 45.00  . Pack years: 90.00  . Types: Cigarettes  Smokeless Tobacco Never Used  Tobacco Comment   daughter states he cannot get to cigarettes now    Labs: Recent Review Flowsheet Data    Labs for ITP Cardiac and Pulmonary Rehab Latest Ref Rng & Units 10/24/2017 10/25/2017 10/26/2017 10/26/2017 11/12/2017   Cholestrol 0 - 200 mg/dL - - - - 140   LDLCALC 0 - 99 mg/dL - - - - 53   HDL >40 mg/dL - - - - 37(L)   Trlycerides <150 mg/dL - - - - 252(H)   Hemoglobin A1c 4.8 - 5.6 % - 6.9(H) - - -   PHART 7.350 - 7.450 7.376 7.433 7.415 7.465(H) -   PCO2ART 32.0 - 48.0 mmHg 83.4(HH) 73.6(HH) 69.1(HH) 63.9(H) -   HCO3 20.0 - 28.0 mmol/L 48.0(H) 48.4(H) 43.4(H) 46.0(H) -   TCO2 22 - 32 mmol/L - - - 48(H) -   ACIDBASEDEF 0.0 - 2.0 mmol/L - - - - -   O2SAT % 95.5 96.5 97.4 100.0 -      Capillary Blood Glucose: Lab Results  Component Value Date   GLUCAP 188 (H) 02/08/2018   GLUCAP 170 (H) 02/03/2018   GLUCAP 394 (H) 02/01/2018   GLUCAP 249 (H) 01/25/2018   GLUCAP 250 (H) 01/25/2018   POCT Glucose    Row Name 01/25/18 1533 02/08/18 1601 02/22/18 1556         POCT Blood Glucose   Pre-Exercise  250 mg/dL  186 mg/dL  200 mg/dL      Post-Exercise  249 mg/dL  188 mg/dL  -        Pulmonary Assessment Scores: Pulmonary Assessment Scores    Row Name 12/27/17 1415         ADL UCSD   ADL Phase  Entry     SOB Score total  24       CAT Score   CAT Score  16        Pulmonary Function Assessment: Pulmonary Function Assessment - 12/27/17 1414      Breath   Bilateral Breath Sounds  Clear;Decreased    Shortness of Breath  No       Exercise Target Goals: Exercise Program Goal: Individual exercise prescription set using results from initial 6 min walk test and THRR while considering  patient's activity barriers and safety.   Exercise Prescription Goal: Initial exercise prescription builds to 30-45 minutes a day of aerobic activity, 2-3 days per week.  Home exercise guidelines will be given to patient during program as part of exercise prescription that the participant will acknowledge.  Activity Barriers & Risk Stratification: Activity Barriers & Cardiac Risk Stratification - 12/27/17 1423      Activity Barriers & Cardiac Risk Stratification   Activity Barriers  Arthritis;Deconditioning;Shortness of Breath;Balance Concerns       6 Minute Walk: 6 Minute Walk    Row Name 01/13/18 0835         6 Minute Walk   Phase  Initial     Distance  1000 feet     Walk Time  6 minutes     # of Rest Breaks  0     MPH  1.89     METS  2.45     RPE  12     Perceived Dyspnea   0     Symptoms  Yes (comment)     Comments  used wheelchair     Resting HR  65 bpm     Resting BP  114/64     Resting Oxygen Saturation   92 %     Exercise Oxygen Saturation  during 6 min walk  91 %     Max Ex. HR  96 bpm     Max Ex. BP  138/64       Interval HR   1 Minute HR  87     2 Minute HR  91     3 Minute HR  91     4 Minute HR  95     5 Minute HR  96     6 Minute HR  92     Interval Heart Rate?  Yes       Interval Oxygen   Interval Oxygen?  Yes     Baseline Oxygen Saturation %  92 %     1 Minute Oxygen Saturation %  91 %      1 Minute Liters of Oxygen  2 L     2 Minute Oxygen Saturation %  93 %     2 Minute Liters of Oxygen  3 L     3 Minute Oxygen Saturation %  93 %     3 Minute Liters of Oxygen  3 L     4 Minute Oxygen Saturation %  93 %     4 Minute Liters of Oxygen  3 L     5 Minute Oxygen Saturation %  95 %     5 Minute Liters of Oxygen  3 L     6 Minute Oxygen Saturation %  95 %     6 Minute Liters of Oxygen  3 L        Oxygen Initial Assessment: Oxygen Initial Assessment - 01/13/18 0829      Initial 6 min Walk   Oxygen  Used  Continuous    Liters per minute  3      Program Oxygen Prescription   Program Oxygen Prescription  Continuous;E-Tanks    Liters per minute  4       Oxygen Re-Evaluation: Oxygen Re-Evaluation    Row Name 01/25/18 0723 02/22/18 0728           Program Oxygen Prescription   Program Oxygen Prescription  Continuous;E-Tanks  Continuous;E-Tanks      Liters per minute  4  4        Home Oxygen   Home Oxygen Device  Home Concentrator;E-Tanks  Home Concentrator;E-Tanks      Sleep Oxygen Prescription  Continuous  Continuous      Liters per minute  3  3      Home Exercise Oxygen Prescription  Continuous  Continuous      Liters per minute  2  2      Home at Rest Exercise Oxygen Prescription  Continuous  Continuous      Liters per minute  2  2      Compliance with Home Oxygen Use  Yes  Yes        Goals/Expected Outcomes   Short Term Goals  To learn and exhibit compliance with exercise, home and travel O2 prescription;To learn and understand importance of monitoring SPO2 with pulse oximeter and demonstrate accurate use of the pulse oximeter.;To learn and understand importance of maintaining oxygen saturations>88%;To learn and demonstrate proper pursed lip breathing techniques or other breathing techniques.;To learn and demonstrate proper use of respiratory medications  To learn and exhibit compliance with exercise, home and travel O2 prescription;To learn and understand  importance of monitoring SPO2 with pulse oximeter and demonstrate accurate use of the pulse oximeter.;To learn and understand importance of maintaining oxygen saturations>88%;To learn and demonstrate proper pursed lip breathing techniques or other breathing techniques.;To learn and demonstrate proper use of respiratory medications      Long  Term Goals  Exhibits compliance with exercise, home and travel O2 prescription;Verbalizes importance of monitoring SPO2 with pulse oximeter and return demonstration;Maintenance of O2 saturations>88%;Exhibits proper breathing techniques, such as pursed lip breathing or other method taught during program session;Compliance with respiratory medication;Demonstrates proper use of MDI's  Exhibits compliance with exercise, home and travel O2 prescription;Verbalizes importance of monitoring SPO2 with pulse oximeter and return demonstration;Maintenance of O2 saturations>88%;Exhibits proper breathing techniques, such as pursed lip breathing or other method taught during program session;Compliance with respiratory medication;Demonstrates proper use of MDI's      Goals/Expected Outcomes  compliance and understanding  compliance and understanding         Oxygen Discharge (Final Oxygen Re-Evaluation): Oxygen Re-Evaluation - 02/22/18 0728      Program Oxygen Prescription   Program Oxygen Prescription  Continuous;E-Tanks    Liters per minute  4      Home Oxygen   Home Oxygen Device  Home Concentrator;E-Tanks    Sleep Oxygen Prescription  Continuous    Liters per minute  3    Home Exercise Oxygen Prescription  Continuous    Liters per minute  2    Home at Rest Exercise Oxygen Prescription  Continuous    Liters per minute  2    Compliance with Home Oxygen Use  Yes      Goals/Expected Outcomes   Short Term Goals  To learn and exhibit compliance with exercise, home and travel O2 prescription;To learn and understand importance of monitoring SPO2 with pulse oximeter and  demonstrate  accurate use of the pulse oximeter.;To learn and understand importance of maintaining oxygen saturations>88%;To learn and demonstrate proper pursed lip breathing techniques or other breathing techniques.;To learn and demonstrate proper use of respiratory medications    Long  Term Goals  Exhibits compliance with exercise, home and travel O2 prescription;Verbalizes importance of monitoring SPO2 with pulse oximeter and return demonstration;Maintenance of O2 saturations>88%;Exhibits proper breathing techniques, such as pursed lip breathing or other method taught during program session;Compliance with respiratory medication;Demonstrates proper use of MDI's    Goals/Expected Outcomes  compliance and understanding       Initial Exercise Prescription: Initial Exercise Prescription - 01/13/18 0800      Date of Initial Exercise RX and Referring Provider   Date  01/13/18    Referring Provider  Dr. Melvyn Novas      Oxygen   Oxygen  Continuous    Liters  3      NuStep   Level  2    SPM  80    Minutes  17    METs  1.5      Arm Ergometer   Level  2    Minutes  17      Track   Laps  5    Minutes  17      Prescription Details   Frequency (times per week)  2    Duration  Progress to 45 minutes of aerobic exercise without signs/symptoms of physical distress      Intensity   THRR 40-80% of Max Heartrate  60-120    Ratings of Perceived Exertion  11-13    Perceived Dyspnea  0-4      Progression   Progression  Continue progressive overload as per policy without signs/symptoms or physical distress.      Resistance Training   Training Prescription  Yes    Weight  blue bands    Reps  10-15       Perform Capillary Blood Glucose checks as needed.  Exercise Prescription Changes: Exercise Prescription Changes    Row Name 01/25/18 1500 02/08/18 1600 02/22/18 1500         Response to Exercise   Blood Pressure (Admit)  98/62  114/60  112/70     Blood Pressure (Exercise)  120/58   112/62  142/82     Blood Pressure (Exit)  100/50  120/64  104/64     Heart Rate (Admit)  62 bpm  72 bpm  66 bpm     Heart Rate (Exercise)  79 bpm  79 bpm  102 bpm     Heart Rate (Exit)  59 bpm  62 bpm  60 bpm     Oxygen Saturation (Admit)  95 %  97 %  96 %     Oxygen Saturation (Exercise)  96 %  96 %  95 %     Oxygen Saturation (Exit)  97 %  97 %  98 %     Rating of Perceived Exertion (Exercise)  _0 Perceived Dyspnea (Exercise)  _1 Duration  Progress to 45 minutes of aerobic exercise without signs/symptoms of physical distress  Progress to 45 minutes of aerobic exercise without signs/symptoms of physical distress  Progress to 45 minutes of aerobic exercise without signs/symptoms of physical distress     Intensity  Other (comment) 40-80% of HRR  THRR unchanged  THRR unchanged       Progression  Progression  Continue to progress workloads to maintain intensity without signs/symptoms of physical distress.  Continue to progress workloads to maintain intensity without signs/symptoms of physical distress.  Continue to progress workloads to maintain intensity without signs/symptoms of physical distress.       Resistance Training   Training Prescription  Yes  Yes  Yes     Weight  blue bands  blue bands  blue bands     Reps  10-15  10-15  10-15     Time  10 Minutes  10 Minutes  10 Minutes       Oxygen   Oxygen  Continuous  Continuous  Continuous     Liters  _0 NuStep   Level  _1 SPM  80  80  80     Minutes  32  17  17     METs  1.4  1.4  1.5       Arm Ergometer   Level  -  1.5  3     Minutes  -  17  17       Track   Laps  _2 Minutes  _3 Exercise Comments:   Exercise Goals and Review:   Exercise Goals Re-Evaluation : Exercise Goals Re-Evaluation    Row Name 01/25/18 0723 02/22/18 0728           Exercise Goal Re-Evaluation   Exercise Goals Review  Increase Physical Activity;Increase Strength and  Stamina;Able to understand and use rate of perceived exertion (RPE) scale;Able to understand and use Dyspnea scale;Knowledge and understanding of Target Heart Rate Range (THRR);Understanding of Exercise Prescription  Increase Physical Activity;Increase Strength and Stamina;Able to understand and use rate of perceived exertion (RPE) scale;Able to understand and use Dyspnea scale;Knowledge and understanding of Target Heart Rate Range (THRR);Understanding of Exercise Prescription      Comments  Patient has only attended 1 rehab session. Will cont. to monitor and progress as able.   Patient has only attended 4 rehab sessions. Biggest barrier right now is not his physical ability but managing his CBG. Patient has called out on a number of occasions becuase his sugar was too high for exercise. Will cont to educate family.      Expected Outcomes  Through rehab and exercising at home, patient will find it easier to carry out ADL's and will gain the confidence to establish an exercise routine at home.   Through rehab and exercising at home, patient will find it easier to carry out ADL's and will gain the confidence to establish an exercise routine at home.          Discharge Exercise Prescription (Final Exercise Prescription Changes): Exercise Prescription Changes - 02/22/18 1500      Response to Exercise   Blood Pressure (Admit)  112/70    Blood Pressure (Exercise)  142/82    Blood Pressure (Exit)  104/64    Heart Rate (Admit)  66 bpm    Heart Rate (Exercise)  102 bpm    Heart Rate (Exit)  60 bpm    Oxygen Saturation (Admit)  96 %    Oxygen Saturation (Exercise)  95 %    Oxygen Saturation (Exit)  98 %    Rating of Perceived Exertion (Exercise)  12    Perceived Dyspnea (  Exercise)  2    Duration  Progress to 45 minutes of aerobic exercise without signs/symptoms of physical distress    Intensity  THRR unchanged      Progression   Progression  Continue to progress workloads to maintain intensity without  signs/symptoms of physical distress.      Resistance Training   Training Prescription  Yes    Weight  blue bands    Reps  10-15    Time  10 Minutes      Oxygen   Oxygen  Continuous    Liters  2      NuStep   Level  2    SPM  80    Minutes  17    METs  1.5      Arm Ergometer   Level  3    Minutes  17      Track   Laps  7    Minutes  17       Nutrition:  Target Goals: Understanding of nutrition guidelines, daily intake of sodium <1583m, cholesterol <2067m calories 30% from fat and 7% or less from saturated fats, daily to have 5 or more servings of fruits and vegetables.  Biometrics: Pre Biometrics - 12/27/17 1424      Pre Biometrics   Grip Strength  28 kg        Nutrition Therapy Plan and Nutrition Goals:   Nutrition Assessments:   Nutrition Goals Re-Evaluation:   Nutrition Goals Discharge (Final Nutrition Goals Re-Evaluation):   Psychosocial: Target Goals: Acknowledge presence or absence of significant depression and/or stress, maximize coping skills, provide positive support system. Participant is able to verbalize types and ability to use techniques and skills needed for reducing stress and depression.  Initial Review & Psychosocial Screening: Initial Psych Review & Screening - 12/27/17 1430      Initial Review   Current issues with  None Identified   dementia     Family Dynamics   Good Support System?  Yes      Barriers   Psychosocial barriers to participate in program  There are no identifiable barriers or psychosocial needs.      Screening Interventions   Interventions  Encouraged to exercise    Expected Outcomes  Short Term goal: Utilizing psychosocial counselor, staff and physician to assist with identification of specific Stressors or current issues interfering with healing process. Setting desired goal for each stressor or current issue identified.;Long Term Goal: Stressors or current issues are controlled or eliminated.;Short Term goal:  Identification and review with participant of any Quality of Life or Depression concerns found by scoring the questionnaire.;Long Term goal: The participant improves quality of Life and PHQ9 Scores as seen by post scores and/or verbalization of changes       Quality of Life Scores:  Scores of 19 and below usually indicate a poorer quality of life in these areas.  A difference of  2-3 points is a clinically meaningful difference.  A difference of 2-3 points in the total score of the Quality of Life Index has been associated with significant improvement in overall quality of life, self-image, physical symptoms, and general health in studies assessing change in quality of life.  PHQ-9: Recent Review Flowsheet Data    [facility]   Decreased Interest 0   Down, Depressed, Hopeless 0   PHQ - 2 Score 0     Interpretation of Total Score  Total Score Depression Severity:  1-4 = Minimal depression,  5-9 = Mild depression, 10-14 = Moderate depression, 15-19 = Moderately severe depression, 20-27 = Severe depression   Psychosocial Evaluation and Intervention: Psychosocial Evaluation - 02/24/18 1653      Psychosocial Evaluation & Interventions   Interventions  Encouraged to exercise with the program and follow exercise prescription    Comments  supportive family that accompanies him to rehab, better blood glucose readings on home meter    Expected Outcomes  No barriers to participation in pulmonary rehab    Continue Psychosocial Services   Follow up required by staff       Psychosocial Re-Evaluation: Psychosocial Re-Evaluation    [place] Name 01/26/18 1238             Psychosocial Re-Evaluation   Current issues with  None Identified       Comments  no identifiable barriers       Expected Outcomes  Pt will continue to display mental well being and hopeful outllook on his future       Interventions  Relaxation education;Encouraged to attend Pulmonary Rehabilitation for  the exercise;Stress management education       Continue Psychosocial Services   Follow up required by staff          Psychosocial Discharge (Final Psychosocial Re-Evaluation): Psychosocial Re-Evaluation - 01/26/18 1238      Psychosocial Re-Evaluation   Current issues with  None Identified    Comments  no identifiable barriers    Expected Outcomes  Pt will continue to display mental well being and hopeful outllook on his future    Interventions  Relaxation education;Encouraged to attend Pulmonary Rehabilitation for the exercise;Stress management education    Continue Psychosocial Services   Follow up required by staff       Education: Education Goals: Education classes will be provided on a weekly basis, covering required topics. Participant will state understanding/return demonstration of topics presented.  Learning Barriers/Preferences: Learning Barriers/Preferences - 12/27/17 1412      Learning Barriers/Preferences   Learning Barriers  Sight;Reading;Hearing    Learning Preferences  Audio;Pictoral       Education Topics: Risk Factor Reduction:  -Group instruction that is supported by a PowerPoint presentation. Instructor discusses the definition of a risk factor, different risk factors for pulmonary disease, and how the heart and lungs work together.     Nutrition for Pulmonary Patient:  -Group instruction provided by PowerPoint slides, verbal discussion, and written materials to support subject matter. The instructor gives an explanation and review of healthy diet recommendations, which includes a discussion on weight management, recommendations for fruit and vegetable consumption, as well as protein, fluid, caffeine, fiber, sodium, sugar, and alcohol. Tips for eating when patients are short of breath are discussed.   PULMONARY REHAB OTHER RESPIRATORY from 02/03/2018 in [place]  Date  02/03/18  Instruction Review Code  1- Verbalizes  Understanding      Pursed Lip Breathing:  -Group instruction that is supported by demonstration and informational handouts. Instructor discusses the benefits of pursed lip and diaphragmatic breathing and detailed demonstration on how to preform both.     Oxygen Safety:  -Group instruction provided by PowerPoint, verbal discussion, and written material to support subject matter. There is an overview of "What is Oxygen" and "Why do we need it".  Instructor also reviews how to create a safe environment for oxygen use, the importance of using oxygen as prescribed, and the risks of noncompliance. There is a brief discussion on traveling with oxygen  and resources the patient may utilize.   Oxygen Equipment:  -Group instruction provided by [facility] Staff utilizing handouts, written materials, and equipment demonstrations.   Signs and Symptoms:  -Group instruction provided by written material and verbal discussion to support subject matter. Warning signs and symptoms of infection, stroke, and heart attack are reviewed and when to call the physician/911 reinforced. Tips for preventing the spread of infection discussed.   Advanced Directives:  -Group instruction provided by verbal instruction and written material to support subject matter. Instructor reviews Advanced Directive laws and proper instruction for filling out document.   Pulmonary Video:  -Group video education that reviews the importance of medication and oxygen compliance, exercise, good nutrition, pulmonary hygiene, and pursed lip and diaphragmatic breathing for the pulmonary patient.   Exercise for the Pulmonary Patient:  -Group instruction that is supported by a PowerPoint presentation. Instructor discusses benefits of exercise, core components of exercise, frequency, duration, and intensity of an exercise routine, importance of utilizing pulse oximetry during exercise, safety while exercising, and options of places to exercise  outside of rehab.     Pulmonary Medications:  -Verbally interactive group education provided by instructor with focus on inhaled medications and proper administration.   Anatomy and Physiology of the Respiratory System and Intimacy:  -Group instruction provided by PowerPoint, verbal discussion, and written material to support subject matter. Instructor reviews respiratory cycle and anatomical components of the respiratory system and their functions. Instructor also reviews differences in obstructive and restrictive respiratory diseases with examples of each. Intimacy, Sex, and Sexuality differences are reviewed with a discussion on how relationships can change when diagnosed with pulmonary disease. Common sexual concerns are reviewed.   MD DAY -A group question and answer session with a medical doctor that allows participants to ask questions that relate to their pulmonary disease state.   OTHER EDUCATION -Group or individual verbal, written, or video instructions that support the educational goals of the pulmonary rehab program.   Holiday Eating Survival Tips:  -Group instruction provided by PowerPoint slides, verbal discussion, and written materials to support subject matter. The instructor gives patients tips, tricks, and techniques to help them not only survive but enjoy the holidays despite the onslaught of food that accompanies the holidays.   Knowledge Questionnaire Score: Knowledge Questionnaire Score - 12/27/17 1413      Knowledge Questionnaire Score   Pre Score  12/18       Core Components/Risk Factors/Patient Goals at Admission: Personal Goals and Risk Factors at Admission - 12/27/17 1424      Core Components/Risk Factors/Patient Goals on Admission   Tobacco Cessation  Yes   daughter says he cannot get to cigarettes, has only had 1 pack in a year   Intervention  Assist the participant in steps to quit. Provide individualized education and counseling about committing to  Tobacco Cessation, relapse prevention, and pharmacological support that can be provided by physician.;Advice worker, assist with locating and accessing local/national Quit Smoking programs, and support quit date choice.    Expected Outcomes  Short Term: Will demonstrate readiness to quit, by selecting a quit date.    Improve shortness of breath with ADL's  Yes    Intervention  Provide education, individualized exercise plan and daily activity instruction to help decrease symptoms of SOB with activities of daily living.    Expected Outcomes  Short Term: Improve cardiorespiratory fitness to achieve a reduction of symptoms when performing ADLs;Long Term: Be able to perform more ADLs without symptoms or  delay the onset of symptoms    Diabetes  Yes    Intervention  Provide education about signs/symptoms and action to take for hypo/hyperglycemia.;Provide education about proper nutrition, including hydration, and aerobic/resistive exercise prescription along with prescribed medications to achieve blood glucose in normal ranges: Fasting glucose 65-99 mg/dL    Expected Outcomes  Short Term: Participant verbalizes understanding of the signs/symptoms and immediate care of hyper/hypoglycemia, proper foot care and importance of medication, aerobic/resistive exercise and nutrition plan for blood glucose control.;Long Term: Attainment of HbA1C < 7%.       Core Components/Risk Factors/Patient Goals Review:  Goals and Risk Factor Review    Row Name 12/27/17 1427 01/26/18 1240 02/24/18 1655         Core Components/Risk Factors/Patient Goals Review   Personal Goals Review  Improve shortness of breath with ADL's;Tobacco Cessation;Diabetes  Improve shortness of breath with ADL's;Tobacco Cessation;Develop more efficient breathing techniques such as purse lipped breathing and diaphragmatic breathing and practicing self-pacing with activity.;Diabetes;Increase knowledge of respiratory medications and  ability to use respiratory devices properly.  Improve shortness of breath with ADL's;Tobacco Cessation;Develop more efficient breathing techniques such as purse lipped breathing and diaphragmatic breathing and practicing self-pacing with activity.;Diabetes;Increase knowledge of respiratory medications and ability to use respiratory devices properly.     Review  The most interested in improving deconditioning and increase strength  Pt with delayed start of exercise.  Pt has completed 2 exercise session from 01/20/18.  Pt reports no change in his tobacco usage. Pt reports minimal shortness of breath on exertion.  Pt is encouraged to maintain his pace of exercise as he often will unknowingly slow down but with reminders he will resume at a higher pace  Pt has completed 6 exerercise session.  Pt reports no change in tobacco usage.  Pt denies any shortness of breath and generally likes to come to exercise.  Pt interacts with staff and is showing improvement of diabetes managment.  Appointment made for pt to follow up with PCP and changes were made with his medications.   HOpefully pt will continue to gain progress during the next 30 day assessment.     Expected Outcomes  Improved strength and conditioning  See Admission Goals/Outcomes  See Admission Goals/Outcomes        Core Components/Risk Factors/Patient Goals at Discharge (Final Review):  Goals and Risk Factor Review - 02/24/18 1655      Core Components/Risk Factors/Patient Goals Review   Personal Goals Review  Improve shortness of breath with ADL's;Tobacco Cessation;Develop more efficient breathing techniques such as purse lipped breathing and diaphragmatic breathing and practicing self-pacing with activity.;Diabetes;Increase knowledge of respiratory medications and ability to use respiratory devices properly.    Review  Pt has completed 6 exerercise session.  Pt reports no change in tobacco usage.  Pt denies any shortness of breath and generally likes to  come to exercise.  Pt interacts with staff and is showing improvement of diabetes managment.  Appointment made for pt to follow up with PCP and changes were made with his medications.   HOpefully pt will continue to gain progress during the next 30 day assessment.    Expected Outcomes  See Admission Goals/Outcomes       ITP Comments: ITP Comments    Row Name 01/26/18 1236 02/24/18 1652         ITP Comments  Dr. Manfred Arch, Medical Director   Dr. Manfred Arch, Medical Director Pulmonary Rehab  Comments: Pt completed 6 exercise sessions. Continue to monitor. Cherre Huger, BSN Cardiac and Training and development officer

## 2018-03-01 ENCOUNTER — Telehealth (HOSPITAL_COMMUNITY): Payer: Self-pay | Admitting: Surgery

## 2018-03-01 ENCOUNTER — Encounter (HOSPITAL_COMMUNITY): Payer: Medicare Other

## 2018-03-01 ENCOUNTER — Telehealth (HOSPITAL_COMMUNITY): Payer: Self-pay | Admitting: Internal Medicine

## 2018-03-01 NOTE — Telephone Encounter (Signed)
I called and left a message for Mr Denk in attempt to reschedule his upcoming appointment in [facility] with Dr. Shirlee Latch Oct 1.

## 2018-03-03 ENCOUNTER — Encounter (HOSPITAL_COMMUNITY): Payer: Medicare Other

## 2018-03-03 ENCOUNTER — Telehealth (HOSPITAL_COMMUNITY): Payer: Self-pay | Admitting: Surgery

## 2018-03-03 NOTE — Telephone Encounter (Signed)
I left message for [name] or Ms. [name] (daughter)  to call  to reschedule his [facility] appt that was cancelled on October 1st.

## 2018-03-04 ENCOUNTER — Encounter: Payer: Medicare Other | Attending: Internal Medicine | Admitting: Registered"

## 2018-03-04 ENCOUNTER — Encounter: Payer: Self-pay | Admitting: Registered"

## 2018-03-04 VITALS — Ht 64.5 in | Wt 160.8 lb

## 2018-03-04 DIAGNOSIS — Z713 Dietary counseling and surveillance: Secondary | ICD-10-CM | POA: Insufficient documentation

## 2018-03-04 DIAGNOSIS — E119 Type 2 diabetes mellitus without complications: Secondary | ICD-10-CM | POA: Diagnosis not present

## 2018-03-04 DIAGNOSIS — E1165 Type 2 diabetes mellitus with hyperglycemia: Secondary | ICD-10-CM | POA: Insufficient documentation

## 2018-03-04 NOTE — Progress Notes (Signed)
Diabetes Self-Management Education  Visit Type: First/Initial  Appt. Start Time: 0845 Appt. End Time: 1000  03/04/2018  [name], identified by name and date of birth, is a 72 y.o. male with a diagnosis of Diabetes: Type 2.   This patient is accompanied in the office by his daughter.  ASSESSMENT Height 5' 4.5" (1.638 m), weight 160 lb 12.8 oz (72.9 kg). Ht from last MD visit 02/18/18 Body mass index is 27.17 kg/m. Per chart pt weighed 156 lbs during hospital admission 11/11/2017. Patient gained weight since discharge but appears to be steadily losing weight now.  Pt's daughter states that patient is not weighed at home, states he is not supposed to gain 3 pounds or more due to heart. RD recommended weighing at least weekly and difference between slow and fast weight gain/loss.  Due to COPD patient has increased protein needs: 73 kg*1.2 g pro = 88 g pro min. Patient is likely not getting that much protein, RD suggested ways to increase protein in diet.  Patient is HOH, patient's daughter helped with providing information and repeating education to patient.   Per referral notes, A1c goal is less than 8%. Pt currently at 9.1% with glipizide dose recently increased. Although RD doesn't not typically recommend apple cider vinegar, but because there is some evidence of efficacy, did include in recommendations since patient is not on an insulin sensitizing medication and is not getting much physical activity.   Pt's daughter states her goal is to check patient's FBG daily, but has not checked as often recently because patient is complaining and tired of his fingers getting poked. Over the last 2 wks she states his FBG is 140-180, usually in the 170s. Pt states she checks after lunch which is before he starts PT, last time was 178. In addition to medications, reduced complex carbohydrates may be adding to patient's fatigue as well as carb cravings for chips and sodas. Pt states she shops at  [employer] and [employer], buys 99 cent tacos. RD did not fully assess SDOH, but financial concerns seems to limit food choices as well. RD provided handout for ideas to help control BG with constraints of buying less expensive foods.   Pt's daughter states she had eliminated potatoes, grits, leafy green vegetables, broccoli. Pt is no longer on coumadin and RD explained that was the medication creating the limited greens recommendation years ago.   Patient's daughter states patient is frustrated with losing autonomy and during visit patient was visibly upset when talking about his daughter stopping him from smoking and eating the foods that he wants to eat. Patient's daughter states her father sleeps a lot and for the last week has not wanted to do physical therapy. Pt's daughter is frustrated that when they go sometimes they are turned away because patient's blood sugar is over 250 mg/dL but she knows the PT will lower the BG.   Diabetes Self-Management Education - 03/04/18 7342      Visit Information   Visit Type  First/Initial      Initial Visit   Diabetes Type  Type 2    Are you currently following a meal plan?  No    Are you taking your medications as prescribed?  Yes      Psychosocial Assessment   Patient Belief/Attitude about Diabetes  Denial    Self-care barriers  Low literacy;Hard of hearing;Other (comment)   dementia   Self-management support  Family    Other persons present  Family Member    How often do you need to have someone help you when you read instructions, pamphlets, or other written materials from your doctor or pharmacy?  5 - Always    What is the last grade level you completed in school?  10      Complications   Last HgB A1C per patient/outside source  9.1 %    How often do you check your blood sugar?  3-4 times / week    Fasting Blood glucose range (mg/dL)  401-027;253-664    Postprandial Blood glucose range (mg/dL)  403-474;>259    Number of hypoglycemic episodes  per month  0    Have you had a dilated eye exam in the past 12 months?  Yes    Have you had a dental exam in the past 12 months?  No    Are you checking your feet?  Yes    How many days per week are you checking your feet?  7      Dietary Intake   Breakfast  Pancakes, drizzle of lite syrup, water OR eggs, whole wheat toast    Snack (morning)  none    Lunch  Roast beef sandwich    Snack (afternoon)  none    Dinner  [employer] roast beef sandwich OR broils or bakes a meat Mrs Sharilyn Sites seasoning, vegetable, starch    Snack (evening)  chips, cookies, or sardines    Beverage(s)  water, dr pepper, sprite      Exercise   Exercise Type  Light (walking / raking leaves)    How many days per week to you exercise?  2    How many minutes per day do you exercise?  30    Total minutes per week of exercise  60      Patient Education   Previous Diabetes Education  No    Nutrition management   Role of diet in the treatment of diabetes and the relationship between the three main macronutrients and blood glucose level    Physical activity and exercise   Role of exercise on diabetes management, blood pressure control and cardiac health.    Medications  Reviewed patients medication for diabetes, action, purpose, timing of dose and side effects.      Individualized Goals (developed by patient)   Nutrition  General guidelines for healthy choices and portions discussed    Physical Activity  Exercise 1-2 times per week    Monitoring   Other (comment)   look into CGM     Outcomes   Expected Outcomes  Demonstrated interest in learning. Expect positive outcomes    Future DMSE  PRN    Program Status  Completed      Individualized Plan for Diabetes Self-Management Training:   Learning Objective:  Patient will have a greater understanding of diabetes self-management. Patient education plan is to attend individual and/or group sessions per assessed needs and concerns.   Patient Instructions  Because you are not  taking metformin (insulin sensitizer) you can try 2oz diluted apple cider vinegar before 1 meal per day. Exercise can really help bring down blood sugar in a different way than your medication does. Continue taking medication as directed Aim to eat balanced meals and snacks, including a protein whenever you have carbohydrate. If possible try to get about 80-90 g protein in per day. Can use nutritional supplements such as Boost to help get more protein If you want to get more Thrive ice cream products  go to website [email or web address] Call insurance company to see if they will cover CGM   Expected Outcomes:  Demonstrated interest in learning. Expect positive outcomes  Education material provided: My Plate, CGM info, [employer] shopping  If problems or questions, patient to contact team via:  Phone  Future DSME appointment: PRN

## 2018-03-04 NOTE — Patient Instructions (Signed)
Because you are not taking metformin (insulin sensitizer) you can try 2oz diluted apple cider vinegar before 1 meal per day. Exercise can really help bring down blood sugar in a different way than your medication does. Continue taking medication as directed Aim to eat balanced meals and snacks, including a protein whenever you have carbohydrate. If possible try to get about 80-90 g protein in per day. Can use nutritional supplements such as Boost to help get more protein If you want to get more Thrive ice cream products go to website [email or web address] Call insurance company to see if they will cover CGM

## 2018-03-06 DIAGNOSIS — J449 Chronic obstructive pulmonary disease, unspecified: Secondary | ICD-10-CM | POA: Diagnosis not present

## 2018-03-06 DIAGNOSIS — I259 Chronic ischemic heart disease, unspecified: Secondary | ICD-10-CM | POA: Diagnosis not present

## 2018-03-07 DIAGNOSIS — I5043 Acute on chronic combined systolic (congestive) and diastolic (congestive) heart failure: Secondary | ICD-10-CM | POA: Diagnosis not present

## 2018-03-07 DIAGNOSIS — I259 Chronic ischemic heart disease, unspecified: Secondary | ICD-10-CM | POA: Diagnosis not present

## 2018-03-08 ENCOUNTER — Encounter (HOSPITAL_COMMUNITY): Payer: Medicare Other

## 2018-03-08 ENCOUNTER — Encounter (HOSPITAL_COMMUNITY): Payer: Self-pay | Admitting: Cardiology

## 2018-03-10 ENCOUNTER — Ambulatory Visit (HOSPITAL_COMMUNITY)
Admission: RE | Admit: 2018-03-10 | Discharge: 2018-03-10 | Disposition: A | Discharge status: Home / Self Care | Payer: Medicare Other | Source: Ambulatory Visit | Attending: Cardiology | Admitting: Cardiology

## 2018-03-10 ENCOUNTER — Encounter (HOSPITAL_COMMUNITY): Payer: Medicare Other

## 2018-03-10 VITALS — BP 120/70 | HR 80 | Wt 160.8 lb

## 2018-03-10 DIAGNOSIS — E785 Hyperlipidemia, unspecified: Secondary | ICD-10-CM | POA: Diagnosis not present

## 2018-03-10 DIAGNOSIS — J449 Chronic obstructive pulmonary disease, unspecified: Secondary | ICD-10-CM | POA: Insufficient documentation

## 2018-03-10 DIAGNOSIS — F319 Bipolar disorder, unspecified: Secondary | ICD-10-CM | POA: Insufficient documentation

## 2018-03-10 DIAGNOSIS — Z7984 Long term (current) use of oral hypoglycemic drugs: Secondary | ICD-10-CM | POA: Insufficient documentation

## 2018-03-10 DIAGNOSIS — I5022 Chronic systolic (congestive) heart failure: Secondary | ICD-10-CM | POA: Diagnosis not present

## 2018-03-10 DIAGNOSIS — F1010 Alcohol abuse, uncomplicated: Secondary | ICD-10-CM | POA: Insufficient documentation

## 2018-03-10 DIAGNOSIS — I4821 Permanent atrial fibrillation: Secondary | ICD-10-CM | POA: Diagnosis not present

## 2018-03-10 DIAGNOSIS — Z952 Presence of prosthetic heart valve: Secondary | ICD-10-CM | POA: Insufficient documentation

## 2018-03-10 DIAGNOSIS — I259 Chronic ischemic heart disease, unspecified: Secondary | ICD-10-CM | POA: Diagnosis not present

## 2018-03-10 DIAGNOSIS — I2510 Atherosclerotic heart disease of native coronary artery without angina pectoris: Secondary | ICD-10-CM | POA: Insufficient documentation

## 2018-03-10 DIAGNOSIS — I110 Hypertensive heart disease with heart failure: Secondary | ICD-10-CM | POA: Diagnosis not present

## 2018-03-10 DIAGNOSIS — I482 Chronic atrial fibrillation, unspecified: Secondary | ICD-10-CM | POA: Diagnosis not present

## 2018-03-10 DIAGNOSIS — Z7901 Long term (current) use of anticoagulants: Secondary | ICD-10-CM | POA: Insufficient documentation

## 2018-03-10 DIAGNOSIS — G4733 Obstructive sleep apnea (adult) (pediatric): Secondary | ICD-10-CM | POA: Diagnosis not present

## 2018-03-10 DIAGNOSIS — Z9981 Dependence on supplemental oxygen: Secondary | ICD-10-CM | POA: Diagnosis not present

## 2018-03-10 DIAGNOSIS — Z955 Presence of coronary angioplasty implant and graft: Secondary | ICD-10-CM | POA: Insufficient documentation

## 2018-03-10 DIAGNOSIS — Z79899 Other long term (current) drug therapy: Secondary | ICD-10-CM | POA: Diagnosis not present

## 2018-03-10 LAB — CBC
HCT: 42.4 % (ref 39.0–52.0)
Hemoglobin: 12.7 g/dL — ABNORMAL LOW (ref 13.0–17.0)
MCH: 28.2 pg (ref 26.0–34.0)
MCHC: 30 g/dL (ref 30.0–36.0)
MCV: 94 fL (ref 78.0–100.0)
PLATELETS: 210 10*3/uL (ref 150–400)
RBC: 4.51 MIL/uL (ref 4.22–5.81)
RDW: 14.6 % (ref 11.5–15.5)
WBC: 12.3 10*3/uL — AB (ref 4.0–10.5)

## 2018-03-10 LAB — BASIC METABOLIC PANEL
Anion gap: 9 (ref 5–15)
BUN: 28 mg/dL — ABNORMAL HIGH (ref 8–23)
CALCIUM: 9.7 mg/dL (ref 8.9–10.3)
CO2: 35 mmol/L — AB (ref 22–32)
CREATININE: 1.18 mg/dL (ref 0.61–1.24)
Chloride: 96 mmol/L — ABNORMAL LOW (ref 98–111)
Glucose, Bld: 293 mg/dL — ABNORMAL HIGH (ref 70–99)
Potassium: 4.4 mmol/L (ref 3.5–5.1)
SODIUM: 140 mmol/L (ref 135–145)

## 2018-03-10 LAB — DIGOXIN LEVEL: DIGOXIN LVL: 0.9 ng/mL (ref 0.8–2.0)

## 2018-03-10 MED ORDER — SPIRONOLACTONE 25 MG PO TABS: 25.0000 mg | ORAL_TABLET | Freq: Every day | ORAL | 6 refills | Status: DC
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
## 2018-03-10 NOTE — Patient Instructions (Signed)
Medication Instructions:  Your physician has recommended you make the following change in your medication:   INCREASE: spironolactone to 25 mg tablet once a day  Labwork: TODAY: CBC, BMET, Digoxin  Your physician recommends that you return for lab work in: 10 days for BMET   Testing/Procedures: None ordered  Follow-Up: Your physician recommends that you schedule a follow-up appointment in: 1 month with Cicero Duck  Your physician recommends that you schedule a follow-up appointment in: 3 months with Dr. Shirlee Latch. Please call the office in December to schedule this appointment.   Any Other Special Instructions Will Be Listed Below (If Applicable).     If you need a refill on your cardiac medications before your next appointment, please call your pharmacy.
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
## 2018-03-15 ENCOUNTER — Encounter (HOSPITAL_COMMUNITY): Payer: Medicare Other

## 2018-03-16 LAB — CUP PACEART REMOTE DEVICE CHECK
Battery Remaining Longevity: 21 mo
Battery Voltage: 2.93 V
Brady Statistic RA Percent Paced: 0 %
Date Time Interrogation Session: 20190916084224
HIGH POWER IMPEDANCE MEASURED VALUE: 48 Ohm
HighPow Impedance: 39 Ohm
Implantable Lead Implant Date: 20060307
Implantable Lead Implant Date: 20080110
Implantable Lead Location: 753860
Implantable Lead Model: 6947
Implantable Pulse Generator Implant Date: 20150827
Lead Channel Impedance Value: 513 Ohm
Lead Channel Pacing Threshold Pulse Width: 0.4 ms
Lead Channel Pacing Threshold Pulse Width: 0.4 ms
Lead Channel Sensing Intrinsic Amplitude: 0.625 mV
Lead Channel Sensing Intrinsic Amplitude: 24.5 mV
Lead Channel Sensing Intrinsic Amplitude: 24.5 mV
Lead Channel Setting Pacing Amplitude: 2.5 V
Lead Channel Setting Pacing Pulse Width: 0.4 ms
Lead Channel Setting Pacing Pulse Width: 0.4 ms
Lead Channel Setting Sensing Sensitivity: 0.45 mV
MDC IDC LEAD IMPLANT DT: 20090617
MDC IDC LEAD LOCATION: 753858
MDC IDC LEAD LOCATION: 753859
MDC IDC MSMT LEADCHNL LV IMPEDANCE VALUE: 380 Ohm
MDC IDC MSMT LEADCHNL LV IMPEDANCE VALUE: 4047 Ohm
MDC IDC MSMT LEADCHNL LV IMPEDANCE VALUE: 4047 Ohm
MDC IDC MSMT LEADCHNL LV PACING THRESHOLD AMPLITUDE: 1.25 V
MDC IDC MSMT LEADCHNL LV PACING THRESHOLD PULSEWIDTH: 0.4 ms
MDC IDC MSMT LEADCHNL RA PACING THRESHOLD AMPLITUDE: 0.625 V
MDC IDC MSMT LEADCHNL RA SENSING INTR AMPL: 0.625 mV
MDC IDC MSMT LEADCHNL RV IMPEDANCE VALUE: 456 Ohm
MDC IDC MSMT LEADCHNL RV IMPEDANCE VALUE: 513 Ohm
MDC IDC MSMT LEADCHNL RV PACING THRESHOLD AMPLITUDE: 1 V
MDC IDC SET LEADCHNL RV PACING AMPLITUDE: 2.5 V
MDC IDC STAT BRADY AP VP PERCENT: 0 %
MDC IDC STAT BRADY AP VS PERCENT: 0 %
MDC IDC STAT BRADY AS VP PERCENT: 94.86 %
MDC IDC STAT BRADY AS VS PERCENT: 5.14 %
MDC IDC STAT BRADY RV PERCENT PACED: 96.11 %

## 2018-03-17 ENCOUNTER — Encounter (HOSPITAL_COMMUNITY): Payer: Medicare Other

## 2018-03-17 DIAGNOSIS — I259 Chronic ischemic heart disease, unspecified: Secondary | ICD-10-CM | POA: Diagnosis not present

## 2018-03-17 DIAGNOSIS — J449 Chronic obstructive pulmonary disease, unspecified: Secondary | ICD-10-CM | POA: Diagnosis not present

## 2018-03-21 ENCOUNTER — Other Ambulatory Visit (HOSPITAL_COMMUNITY): Payer: Self-pay

## 2018-03-22 ENCOUNTER — Encounter (HOSPITAL_COMMUNITY): Payer: Medicare Other

## 2018-03-23 DIAGNOSIS — J449 Chronic obstructive pulmonary disease, unspecified: Secondary | ICD-10-CM | POA: Diagnosis not present

## 2018-03-23 DIAGNOSIS — I259 Chronic ischemic heart disease, unspecified: Secondary | ICD-10-CM | POA: Diagnosis not present

## 2018-03-24 ENCOUNTER — Encounter (HOSPITAL_COMMUNITY): Payer: Medicare Other

## 2018-03-24 NOTE — Addendum Note (Signed)
Encounter addended by: Emilia Beck on: 03/24/2018 7:42 AM  Actions taken: Flowsheet data copied forward, Visit Navigator Flowsheet section accepted

## 2018-03-24 NOTE — Addendum Note (Signed)
Encounter addended by: Chelsea Aus, RN on: 03/24/2018 10:25 AM  Actions taken: Flowsheet data copied forward, Visit Navigator Flowsheet section accepted, Sign clinical note

## 2018-03-24 NOTE — Progress Notes (Signed)
Pulmonary Individual Treatment Plan  Patient Details  Name: [name] MRN: [id number] Date of Birth: 1945/10/06 Referring Provider:     Pulmonary Rehab Walk Test from 01/11/2018 in [place]  Referring Provider  Dr. Melvyn Novas      Initial Encounter Date:    Pulmonary Rehab Walk Test from 01/11/2018 in [place]  Date  01/13/18      Visit Diagnosis: Chronic respiratory failure with hypoxia and hypercapnia (HCC)  Chronic systolic congestive heart failure ([place])  Patient's Home Medications on Admission:   Current Outpatient Medications:  .  apixaban (ELIQUIS) 5 MG TABS tablet, Take 1 tablet (5 mg total) by mouth 2 (two) times daily., Disp: 180 tablet, Rfl: 1 .  arformoterol (BROVANA) 15 MCG/2ML NEBU, Take 2 mLs (15 mcg total) by nebulization 2 (two) times daily for 15 days., Disp: 120 mL, Rfl: 1 .  atorvastatin (LIPITOR) 40 MG tablet, Take 1 tablet (40 mg total) by mouth daily at 6 PM., Disp: 90 tablet, Rfl: 1 .  budesonide (PULMICORT) 0.5 MG/2ML nebulizer solution, Take 2 mLs (0.5 mg total) by nebulization 2 (two) times daily. DX: J44.9, Disp: 120 mL, Rfl: 5 .  carvedilol (COREG) 6.25 MG tablet, Take 1 tablet (6.25 mg total) by mouth 2 (two) times daily with a meal., Disp: 180 tablet, Rfl: 3 .  digoxin (LANOXIN) 0.125 MG tablet, Take 1 tablet (0.125 mg total) by mouth daily., Disp: 90 tablet, Rfl: 3 .  glipiZIDE (GLUCOTROL) 5 MG tablet, Take 5 mg daily before breakfast by mouth., Disp: , Rfl:  .  ipratropium-albuterol (DUONEB) 0.5-2.5 (3) MG/3ML SOLN, Take 3 mLs by nebulization every 4 (four) hours as needed., Disp: 360 mL, Rfl: 0 .  lactulose (CHRONULAC) 10 GM/15ML solution, TAKE 45 MILLILITERS BY MOUTH TWICE A DAY, Disp: 2700 mL, Rfl: 6 .  loratadine (CLARITIN) 10 MG tablet, Take 1 tablet (10 mg total) by mouth daily., Disp: 30 tablet, Rfl: 1 .  losartan (COZAAR) 25 MG tablet, Take 1 tablet (25 mg total) by mouth daily.,  Disp: 30 tablet, Rfl: 6 .  Multiple Vitamin (MULTIVITAMIN WITH MINERALS) TABS tablet, Take 1 tablet by mouth daily., Disp: 30 tablet, Rfl: 2 .  nitroGLYCERIN (NITROSTAT) 0.4 MG SL tablet, Place 1 tablet (0.4 mg total) under the tongue every 5 (five) minutes as needed for chest pain., Disp: 25 tablet, Rfl: 2 .  OXYGEN, Inhale 2.5 L into the lungs continuous. , Disp: , Rfl:  .  senna-docusate (SENOKOT-S) 8.6-50 MG tablet, Take 1 tablet by mouth daily. Hold for diarrhea., Disp: 30 tablet, Rfl: 0 .  spironolactone (ALDACTONE) 25 MG tablet, Take 1 tablet (25 mg total) by mouth daily., Disp: 30 tablet, Rfl: 6 .  thiamine 100 MG tablet, Take 1 tablet (100 mg total) by mouth daily., Disp: 30 tablet, Rfl: 0 .  torsemide (DEMADEX) 20 MG tablet, Take 4 tablets (80 mg total) by mouth daily., Disp: 120 tablet, Rfl: 6  Past Medical History: Past Medical History:  Diagnosis Date  . AICD (automatic cardioverter/defibrillator) present   . Alcohol abuse   . Anxiety   . Bipolar disorder ([place])   . Biventricular automatic implantable cardioverter defibrillator in situ    a. 01/2014 s/p MDT [id number] Viva XT CRT-D (ser # [id number]).  . CAD (coronary artery disease)    a. s/p prior PCI/stenting of the LCX;  b. 08/2014 MV: EF 20%, large septal, apical, and inferior infarct from apex to base, no ischemia;  c. 08/2014 NSTEMI/Cath: LM mod distal dzs extending into ostial LCX (80%), LAD tortuous, RI nl, OM mod dzs, RCA 100 CTO with R->R and L->R collats-->Med Rx.  . Chronic atrial fibrillation (HCC)    a. CHA2DS2VASc = 6->eliquis;  b. S/P AVN RFCA an BiV ICD placement.  . Chronic respiratory failure ([place])   . Chronic systolic congestive heart failure ([place])    a. 01/2014 Echo: EF 25-30%, mod conc LVH, mod dil LA.  . CKD (chronic kidney disease)    a. suspect stage II-III based on historical labs.  . Complete heart block ([place])    a. In setting of prior AV nodal ablation r/t afib-->BiV ICD (01/2014).  . COPD (chronic  obstructive pulmonary disease) ([place])   . CVA (cerebral vascular accident) ([place])    a. Multiple prior embolic strokes.  . Dementia   . Depression   . DVT (deep venous thrombosis) ([place])   . Dyslipidemia   . Hyperglycemia   . Hypertension   . Mitral valve disease    a. remote mitral replacement with Bjork Shiley valve;  b. 06/2006 Redo MVR with tissue valve.  . Mixed Ischemic and Nonischemic Cardiomyopathy    a. 8/.2015 Echo: Ef 25-30%;  b. 01/2014 s/p MDT [id number] Pershing Proud XT CRT-D (ser # [id number]).  . On home oxygen therapy    "2L; 24/7" (65/16/2019)  . Psoriasis    "back of his head; elbows; finger of left hand" (06/07/2017)  . Ventral hernia     Tobacco Use: Social History   Tobacco Use  Smoking Status Current Some Day Smoker  . Packs/day: 2.00  . Years: 45.00  . Pack years: 90.00  . Types: Cigarettes  Smokeless Tobacco Never Used  Tobacco Comment   daughter states he cannot get to cigarettes now    Labs: Recent Review Flowsheet Data    Labs for ITP Cardiac and Pulmonary Rehab Latest Ref Rng & Units 10/24/2017 10/25/2017 10/26/2017 10/26/2017 11/12/2017   Cholestrol 0 - 200 mg/dL - - - - 140   LDLCALC 0 - 99 mg/dL - - - - 53   HDL >40 mg/dL - - - - 37(L)   Trlycerides <150 mg/dL - - - - 252(H)   Hemoglobin A1c 4.8 - 5.6 % - 6.9(H) - - -   PHART 7.350 - 7.450 7.376 7.433 7.415 7.465(H) -   PCO2ART 32.0 - 48.0 mmHg 83.4(HH) 73.6(HH) 69.1(HH) 63.9(H) -   HCO3 20.0 - 28.0 mmol/L 48.0(H) 48.4(H) 43.4(H) 46.0(H) -   TCO2 22 - 32 mmol/L - - - 48(H) -   ACIDBASEDEF 0.0 - 2.0 mmol/L - - - - -   O2SAT % 95.5 96.5 97.4 100.0 -      Capillary Blood Glucose: Lab Results  Component Value Date   GLUCAP 188 (H) 02/08/2018   GLUCAP 170 (H) 02/03/2018   GLUCAP 394 (H) 02/01/2018   GLUCAP 249 (H) 01/25/2018   GLUCAP 250 (H) 01/25/2018   POCT Glucose    Row Name 01/25/18 1533 02/08/18 1601 02/22/18 1556         POCT Blood Glucose   Pre-Exercise  250 mg/dL  186 mg/dL  200 mg/dL      Post-Exercise  249 mg/dL  188 mg/dL  -        Pulmonary Assessment Scores: Pulmonary Assessment Scores    Row Name 12/27/17 1415         ADL UCSD   ADL Phase  Entry     SOB Score total  24       CAT Score   CAT Score  16        Pulmonary Function Assessment: Pulmonary Function Assessment - 12/27/17 1414      Breath   Bilateral Breath Sounds  Clear;Decreased    Shortness of Breath  No       Exercise Target Goals: Exercise Program Goal: Individual exercise prescription set using results from initial 6 min walk test and THRR while considering  patient's activity barriers and safety.   Exercise Prescription Goal: Initial exercise prescription builds to 30-45 minutes a day of aerobic activity, 2-3 days per week.  Home exercise guidelines will be given to patient during program as part of exercise prescription that the participant will acknowledge.  Activity Barriers & Risk Stratification: Activity Barriers & Cardiac Risk Stratification - 12/27/17 1423      Activity Barriers & Cardiac Risk Stratification   Activity Barriers  Arthritis;Deconditioning;Shortness of Breath;Balance Concerns       6 Minute Walk: 6 Minute Walk    Row Name 01/13/18 0835         6 Minute Walk   Phase  Initial     Distance  1000 feet     Walk Time  6 minutes     # of Rest Breaks  0     MPH  1.89     METS  2.45     RPE  12     Perceived Dyspnea   0     Symptoms  Yes (comment)     Comments  used wheelchair     Resting HR  65 bpm     Resting BP  114/64     Resting Oxygen Saturation   92 %     Exercise Oxygen Saturation  during 6 min walk  91 %     Max Ex. HR  96 bpm     Max Ex. BP  138/64       Interval HR   1 Minute HR  87     2 Minute HR  91     3 Minute HR  91     4 Minute HR  95     5 Minute HR  96     6 Minute HR  92     Interval Heart Rate?  Yes       Interval Oxygen   Interval Oxygen?  Yes     Baseline Oxygen Saturation %  92 %     1 Minute Oxygen Saturation %  91 %      1 Minute Liters of Oxygen  2 L     2 Minute Oxygen Saturation %  93 %     2 Minute Liters of Oxygen  3 L     3 Minute Oxygen Saturation %  93 %     3 Minute Liters of Oxygen  3 L     4 Minute Oxygen Saturation %  93 %     4 Minute Liters of Oxygen  3 L     5 Minute Oxygen Saturation %  95 %     5 Minute Liters of Oxygen  3 L     6 Minute Oxygen Saturation %  95 %     6 Minute Liters of Oxygen  3 L        Oxygen Initial Assessment: Oxygen Initial Assessment - 01/13/18 0829      Initial 6 min Walk   Oxygen  Used  Continuous    Liters per minute  3      Program Oxygen Prescription   Program Oxygen Prescription  Continuous;E-Tanks    Liters per minute  4       Oxygen Re-Evaluation: Oxygen Re-Evaluation    Row Name 01/25/18 0723 02/22/18 0728           Program Oxygen Prescription   Program Oxygen Prescription  Continuous;E-Tanks  Continuous;E-Tanks      Liters per minute  4  4        Home Oxygen   Home Oxygen Device  Home Concentrator;E-Tanks  Home Concentrator;E-Tanks      Sleep Oxygen Prescription  Continuous  Continuous      Liters per minute  3  3      Home Exercise Oxygen Prescription  Continuous  Continuous      Liters per minute  2  2      Home at Rest Exercise Oxygen Prescription  Continuous  Continuous      Liters per minute  2  2      Compliance with Home Oxygen Use  Yes  Yes        Goals/Expected Outcomes   Short Term Goals  To learn and exhibit compliance with exercise, home and travel O2 prescription;To learn and understand importance of monitoring SPO2 with pulse oximeter and demonstrate accurate use of the pulse oximeter.;To learn and understand importance of maintaining oxygen saturations>88%;To learn and demonstrate proper pursed lip breathing techniques or other breathing techniques.;To learn and demonstrate proper use of respiratory medications  To learn and exhibit compliance with exercise, home and travel O2 prescription;To learn and understand  importance of monitoring SPO2 with pulse oximeter and demonstrate accurate use of the pulse oximeter.;To learn and understand importance of maintaining oxygen saturations>88%;To learn and demonstrate proper pursed lip breathing techniques or other breathing techniques.;To learn and demonstrate proper use of respiratory medications      Long  Term Goals  Exhibits compliance with exercise, home and travel O2 prescription;Verbalizes importance of monitoring SPO2 with pulse oximeter and return demonstration;Maintenance of O2 saturations>88%;Exhibits proper breathing techniques, such as pursed lip breathing or other method taught during program session;Compliance with respiratory medication;Demonstrates proper use of MDI's  Exhibits compliance with exercise, home and travel O2 prescription;Verbalizes importance of monitoring SPO2 with pulse oximeter and return demonstration;Maintenance of O2 saturations>88%;Exhibits proper breathing techniques, such as pursed lip breathing or other method taught during program session;Compliance with respiratory medication;Demonstrates proper use of MDI's      Goals/Expected Outcomes  compliance and understanding  compliance and understanding         Oxygen Discharge (Final Oxygen Re-Evaluation): Oxygen Re-Evaluation - 02/22/18 0728      Program Oxygen Prescription   Program Oxygen Prescription  Continuous;E-Tanks    Liters per minute  4      Home Oxygen   Home Oxygen Device  Home Concentrator;E-Tanks    Sleep Oxygen Prescription  Continuous    Liters per minute  3    Home Exercise Oxygen Prescription  Continuous    Liters per minute  2    Home at Rest Exercise Oxygen Prescription  Continuous    Liters per minute  2    Compliance with Home Oxygen Use  Yes      Goals/Expected Outcomes   Short Term Goals  To learn and exhibit compliance with exercise, home and travel O2 prescription;To learn and understand importance of monitoring SPO2 with pulse oximeter and  demonstrate  accurate use of the pulse oximeter.;To learn and understand importance of maintaining oxygen saturations>88%;To learn and demonstrate proper pursed lip breathing techniques or other breathing techniques.;To learn and demonstrate proper use of respiratory medications    Long  Term Goals  Exhibits compliance with exercise, home and travel O2 prescription;Verbalizes importance of monitoring SPO2 with pulse oximeter and return demonstration;Maintenance of O2 saturations>88%;Exhibits proper breathing techniques, such as pursed lip breathing or other method taught during program session;Compliance with respiratory medication;Demonstrates proper use of MDI's    Goals/Expected Outcomes  compliance and understanding       Initial Exercise Prescription: Initial Exercise Prescription - 01/13/18 0800      Date of Initial Exercise RX and Referring Provider   Date  01/13/18    Referring Provider  Dr. Melvyn Novas      Oxygen   Oxygen  Continuous    Liters  3      NuStep   Level  2    SPM  80    Minutes  17    METs  1.5      Arm Ergometer   Level  2    Minutes  17      Track   Laps  5    Minutes  17      Prescription Details   Frequency (times per week)  2    Duration  Progress to 45 minutes of aerobic exercise without signs/symptoms of physical distress      Intensity   THRR 40-80% of Max Heartrate  60-120    Ratings of Perceived Exertion  11-13    Perceived Dyspnea  0-4      Progression   Progression  Continue progressive overload as per policy without signs/symptoms or physical distress.      Resistance Training   Training Prescription  Yes    Weight  blue bands    Reps  10-15       Perform Capillary Blood Glucose checks as needed.  Exercise Prescription Changes: Exercise Prescription Changes    Row Name 01/25/18 1500 02/08/18 1600 02/22/18 1500         Response to Exercise   Blood Pressure (Admit)  98/62  114/60  112/70     Blood Pressure (Exercise)  120/58   112/62  142/82     Blood Pressure (Exit)  100/50  120/64  104/64     Heart Rate (Admit)  62 bpm  72 bpm  66 bpm     Heart Rate (Exercise)  79 bpm  79 bpm  102 bpm     Heart Rate (Exit)  59 bpm  62 bpm  60 bpm     Oxygen Saturation (Admit)  95 %  97 %  96 %     Oxygen Saturation (Exercise)  96 %  96 %  95 %     Oxygen Saturation (Exit)  97 %  97 %  98 %     Rating of Perceived Exertion (Exercise)  _0 Perceived Dyspnea (Exercise)  _1 Duration  Progress to 45 minutes of aerobic exercise without signs/symptoms of physical distress  Progress to 45 minutes of aerobic exercise without signs/symptoms of physical distress  Progress to 45 minutes of aerobic exercise without signs/symptoms of physical distress     Intensity  Other (comment) 40-80% of HRR  THRR unchanged  THRR unchanged       Progression  Progression  Continue to progress workloads to maintain intensity without signs/symptoms of physical distress.  Continue to progress workloads to maintain intensity without signs/symptoms of physical distress.  Continue to progress workloads to maintain intensity without signs/symptoms of physical distress.       Resistance Training   Training Prescription  Yes  Yes  Yes     Weight  blue bands  blue bands  blue bands     Reps  10-15  10-15  10-15     Time  10 Minutes  10 Minutes  10 Minutes       Oxygen   Oxygen  Continuous  Continuous  Continuous     Liters  _0 NuStep   Level  _1 SPM  80  80  80     Minutes  32  17  17     METs  1.4  1.4  1.5       Arm Ergometer   Level  -  1.5  3     Minutes  -  17  17       Track   Laps  _2 Minutes  _3 Exercise Comments:   Exercise Goals and Review:   Exercise Goals Re-Evaluation : Exercise Goals Re-Evaluation    Row Name 01/25/18 0723 02/22/18 0728 03/24/18 0740         Exercise Goal Re-Evaluation   Exercise Goals Review  Increase Physical Activity;Increase Strength and  Stamina;Able to understand and use rate of perceived exertion (RPE) scale;Able to understand and use Dyspnea scale;Knowledge and understanding of Target Heart Rate Range (THRR);Understanding of Exercise Prescription  Increase Physical Activity;Increase Strength and Stamina;Able to understand and use rate of perceived exertion (RPE) scale;Able to understand and use Dyspnea scale;Knowledge and understanding of Target Heart Rate Range (THRR);Understanding of Exercise Prescription  Increase Physical Activity;Increase Strength and Stamina;Able to understand and use rate of perceived exertion (RPE) scale;Able to understand and use Dyspnea scale;Knowledge and understanding of Target Heart Rate Range (THRR);Understanding of Exercise Prescription     Comments  Patient has only attended 1 rehab session. Will cont. to monitor and progress as able.   Patient has only attended 4 rehab sessions. Biggest barrier right now is not his physical ability but managing his CBG. Patient has called out on a number of occasions becuase his sugar was too high for exercise. Will cont to educate family.  Patient has not been to rehab since 03/24/18. Not able to get in touch with patient or daughter. Will try again today--if unsuccessful will drop patient.     Expected Outcomes  Through rehab and exercising at home, patient will find it easier to carry out ADL's and will gain the confidence to establish an exercise routine at home.   Through rehab and exercising at home, patient will find it easier to carry out ADL's and will gain the confidence to establish an exercise routine at home.   Through rehab and exercising at home, patient will find it easier to carry out ADL's and will gain the confidence to establish an exercise routine at home.         Discharge Exercise Prescription (Final Exercise Prescription Changes): Exercise Prescription Changes - 02/22/18 1500      Response to Exercise   Blood Pressure (Admit)  112/70    Blood  Pressure (Exercise)  142/82    Blood Pressure (Exit)  104/64    Heart Rate (Admit)  66 bpm    Heart Rate (Exercise)  102 bpm    Heart Rate (Exit)  60 bpm    Oxygen Saturation (Admit)  96 %    Oxygen Saturation (Exercise)  95 %    Oxygen Saturation (Exit)  98 %    Rating of Perceived Exertion (Exercise)  12    Perceived Dyspnea (Exercise)  2    Duration  Progress to 45 minutes of aerobic exercise without signs/symptoms of physical distress    Intensity  THRR unchanged      Progression   Progression  Continue to progress workloads to maintain intensity without signs/symptoms of physical distress.      Resistance Training   Training Prescription  Yes    Weight  blue bands    Reps  10-15    Time  10 Minutes      Oxygen   Oxygen  Continuous    Liters  2      NuStep   Level  2    SPM  80    Minutes  17    METs  1.5      Arm Ergometer   Level  3    Minutes  17      Track   Laps  7    Minutes  17       Nutrition:  Target Goals: Understanding of nutrition guidelines, daily intake of sodium <1565m, cholesterol <203m calories 30% from fat and 7% or less from saturated fats, daily to have 5 or more servings of fruits and vegetables.  Biometrics: Pre Biometrics - 12/27/17 1424      Pre Biometrics   Grip Strength  28 kg        Nutrition Therapy Plan and Nutrition Goals:   Nutrition Assessments:   Nutrition Goals Re-Evaluation:   Nutrition Goals Discharge (Final Nutrition Goals Re-Evaluation):   Psychosocial: Target Goals: Acknowledge presence or absence of significant depression and/or stress, maximize coping skills, provide positive support system. Participant is able to verbalize types and ability to use techniques and skills needed for reducing stress and depression.  Initial Review & Psychosocial Screening: Initial Psych Review & Screening - 12/27/17 1430      Initial Review   Current issues with  None Identified   dementia     Family Dynamics    Good Support System?  Yes      Barriers   Psychosocial barriers to participate in program  There are no identifiable barriers or psychosocial needs.      Screening Interventions   Interventions  Encouraged to exercise    Expected Outcomes  Short Term goal: Utilizing psychosocial counselor, staff and physician to assist with identification of specific Stressors or current issues interfering with healing process. Setting desired goal for each stressor or current issue identified.;Long Term Goal: Stressors or current issues are controlled or eliminated.;Short Term goal: Identification and review with participant of any Quality of Life or Depression concerns found by scoring the questionnaire.;Long Term goal: The participant improves quality of Life and PHQ9 Scores as seen by post scores and/or verbalization of changes       Quality of Life Scores:  Scores of 19 and below usually indicate a poorer quality of life in these areas.  A difference of  2-3 points is a clinically meaningful difference.  A difference of 2-3  points in the total score of the Quality of Life Index has been associated with significant improvement in overall quality of life, self-image, physical symptoms, and general health in studies assessing change in quality of life.  PHQ-9: Recent Review Flowsheet Data    Depression screen [facility] 2/9 03/04/2018 12/27/2017   Decreased Interest 0 0   Down, Depressed, Hopeless 0 0   PHQ - 2 Score 0 0     Interpretation of Total Score  Total Score Depression Severity:  1-4 = Minimal depression, 5-9 = Mild depression, 10-14 = Moderate depression, 15-19 = Moderately severe depression, 20-27 = Severe depression   Psychosocial Evaluation and Intervention: Psychosocial Evaluation - 03/24/18 1014      Psychosocial Evaluation & Interventions   Interventions  Encouraged to exercise with the program and follow exercise prescription    Comments  Last exercised on 9/17 unable to assess.    Expected  Outcomes  No barriers to participation in pulmonary rehab    Continue Psychosocial Services   Follow up required by staff       Psychosocial Re-Evaluation: Psychosocial Re-Evaluation    [place] Name 01/26/18 1238 03/24/18 1020           Psychosocial Re-Evaluation   Current issues with  None Identified  -      Comments  no identifiable barriers  no identifiable barriers      Expected Outcomes  Pt will continue to display mental well being and hopeful outllook on his future  Pt will continue a hopeful outllook on his future      Interventions  Relaxation education;Encouraged to attend Pulmonary Rehabilitation for the exercise;Stress management education  Relaxation education;Encouraged to attend Pulmonary Rehabilitation for the exercise;Stress management education      Continue Psychosocial Services   Follow up required by staff  Follow up required by staff         Psychosocial Discharge (Final Psychosocial Re-Evaluation): Psychosocial Re-Evaluation - 03/24/18 1020      Psychosocial Re-Evaluation   Comments  no identifiable barriers    Expected Outcomes  Pt will continue a hopeful outllook on his future    Interventions  Relaxation education;Encouraged to attend Pulmonary Rehabilitation for the exercise;Stress management education    Continue Psychosocial Services   Follow up required by staff       Education: Education Goals: Education classes will be provided on a weekly basis, covering required topics. Participant will state understanding/return demonstration of topics presented.  Learning Barriers/Preferences: Learning Barriers/Preferences - 12/27/17 1412      Learning Barriers/Preferences   Learning Barriers  Sight;Reading;Hearing    Learning Preferences  Audio;Pictoral       Education Topics: Risk Factor Reduction:  -Group instruction that is supported by a PowerPoint presentation. Instructor discusses the definition of a risk factor, different risk factors for pulmonary  disease, and how the heart and lungs work together.     Nutrition for Pulmonary Patient:  -Group instruction provided by PowerPoint slides, verbal discussion, and written materials to support subject matter. The instructor gives an explanation and review of healthy diet recommendations, which includes a discussion on weight management, recommendations for fruit and vegetable consumption, as well as protein, fluid, caffeine, fiber, sodium, sugar, and alcohol. Tips for eating when patients are short of breath are discussed.   PULMONARY REHAB OTHER RESPIRATORY from 02/03/2018 in [place]  Date  02/03/18  Instruction Review Code  1- Verbalizes Understanding      Pursed Lip Breathing:  -  Group instruction that is supported by demonstration and informational handouts. Instructor discusses the benefits of pursed lip and diaphragmatic breathing and detailed demonstration on how to preform both.     Oxygen Safety:  -Group instruction provided by PowerPoint, verbal discussion, and written material to support subject matter. There is an overview of "What is Oxygen" and "Why do we need it".  Instructor also reviews how to create a safe environment for oxygen use, the importance of using oxygen as prescribed, and the risks of noncompliance. There is a brief discussion on traveling with oxygen and resources the patient may utilize.   Oxygen Equipment:  -Group instruction provided by [facility] Staff utilizing handouts, written materials, and equipment demonstrations.   Signs and Symptoms:  -Group instruction provided by written material and verbal discussion to support subject matter. Warning signs and symptoms of infection, stroke, and heart attack are reviewed and when to call the physician/911 reinforced. Tips for preventing the spread of infection discussed.   Advanced Directives:  -Group instruction provided by verbal instruction and written material to support  subject matter. Instructor reviews Advanced Directive laws and proper instruction for filling out document.   Pulmonary Video:  -Group video education that reviews the importance of medication and oxygen compliance, exercise, good nutrition, pulmonary hygiene, and pursed lip and diaphragmatic breathing for the pulmonary patient.   Exercise for the Pulmonary Patient:  -Group instruction that is supported by a PowerPoint presentation. Instructor discusses benefits of exercise, core components of exercise, frequency, duration, and intensity of an exercise routine, importance of utilizing pulse oximetry during exercise, safety while exercising, and options of places to exercise outside of rehab.     Pulmonary Medications:  -Verbally interactive group education provided by instructor with focus on inhaled medications and proper administration.   Anatomy and Physiology of the Respiratory System and Intimacy:  -Group instruction provided by PowerPoint, verbal discussion, and written material to support subject matter. Instructor reviews respiratory cycle and anatomical components of the respiratory system and their functions. Instructor also reviews differences in obstructive and restrictive respiratory diseases with examples of each. Intimacy, Sex, and Sexuality differences are reviewed with a discussion on how relationships can change when diagnosed with pulmonary disease. Common sexual concerns are reviewed.   MD DAY -A group question and answer session with a medical doctor that allows participants to ask questions that relate to their pulmonary disease state.   OTHER EDUCATION -Group or individual verbal, written, or video instructions that support the educational goals of the pulmonary rehab program.   Holiday Eating Survival Tips:  -Group instruction provided by PowerPoint slides, verbal discussion, and written materials to support subject matter. The instructor gives patients tips,  tricks, and techniques to help them not only survive but enjoy the holidays despite the onslaught of food that accompanies the holidays.   Knowledge Questionnaire Score: Knowledge Questionnaire Score - 12/27/17 1413      Knowledge Questionnaire Score   Pre Score  12/18       Core Components/Risk Factors/Patient Goals at Admission: Personal Goals and Risk Factors at Admission - 12/27/17 1424      Core Components/Risk Factors/Patient Goals on Admission   Tobacco Cessation  Yes   daughter says he cannot get to cigarettes, has only had 1 pack in a year   Intervention  Assist the participant in steps to quit. Provide individualized education and counseling about committing to Tobacco Cessation, relapse prevention, and pharmacological support that can be provided by physician.;Advice worker, assist  with locating and accessing local/national Quit Smoking programs, and support quit date choice.    Expected Outcomes  Short Term: Will demonstrate readiness to quit, by selecting a quit date.    Improve shortness of breath with ADL's  Yes    Intervention  Provide education, individualized exercise plan and daily activity instruction to help decrease symptoms of SOB with activities of daily living.    Expected Outcomes  Short Term: Improve cardiorespiratory fitness to achieve a reduction of symptoms when performing ADLs;Long Term: Be able to perform more ADLs without symptoms or delay the onset of symptoms    Diabetes  Yes    Intervention  Provide education about signs/symptoms and action to take for hypo/hyperglycemia.;Provide education about proper nutrition, including hydration, and aerobic/resistive exercise prescription along with prescribed medications to achieve blood glucose in normal ranges: Fasting glucose 65-99 mg/dL    Expected Outcomes  Short Term: Participant verbalizes understanding of the signs/symptoms and immediate care of hyper/hypoglycemia, proper foot care and  importance of medication, aerobic/resistive exercise and nutrition plan for blood glucose control.;Long Term: Attainment of HbA1C < 7%.       Core Components/Risk Factors/Patient Goals Review:  Goals and Risk Factor Review    Row Name 12/27/17 1427 01/26/18 1240 02/24/18 1655 03/24/18 1021       Core Components/Risk Factors/Patient Goals Review   Personal Goals Review  Improve shortness of breath with ADL's;Tobacco Cessation;Diabetes  Improve shortness of breath with ADL's;Tobacco Cessation;Develop more efficient breathing techniques such as purse lipped breathing and diaphragmatic breathing and practicing self-pacing with activity.;Diabetes;Increase knowledge of respiratory medications and ability to use respiratory devices properly.  Improve shortness of breath with ADL's;Tobacco Cessation;Develop more efficient breathing techniques such as purse lipped breathing and diaphragmatic breathing and practicing self-pacing with activity.;Diabetes;Increase knowledge of respiratory medications and ability to use respiratory devices properly.  Improve shortness of breath with ADL's;Tobacco Cessation;Develop more efficient breathing techniques such as purse lipped breathing and diaphragmatic breathing and practicing self-pacing with activity.;Diabetes;Increase knowledge of respiratory medications and ability to use respiratory devices properly.    Review  The most interested in improving deconditioning and increase strength  Pt with delayed start of exercise.  Pt has completed 2 exercise session from 01/20/18.  Pt reports no change in his tobacco usage. Pt reports minimal shortness of breath on exertion.  Pt is encouraged to maintain his pace of exercise as he often will unknowingly slow down but with reminders he will resume at a higher pace  Pt has completed 6 exerercise session.  Pt reports no change in tobacco usage.  Pt denies any shortness of breath and generally likes to come to exercise.  Pt interacts with  staff and is showing improvement of diabetes managment.  Appointment made for pt to follow up with PCP and changes were made with his medications.   HOpefully pt will continue to gain progress during the next 30 day assessment.  Pt has completed 6 exerercise session.  Last exercise on 9/17.  Remarked to MD during office vistit, that we worked him too hard.  Pt average met 1.5. Pt reports no change in tobacco usage.  Pt denies any shortness of breath and generally likes to come to exercise.  Pt interacts with staff and is showing improvement of diabetes managment.  Willl contact pt to let him know due to his non attendance, planning to discharge pt from pulmonary rehab.    Expected Outcomes  Improved strength and conditioning  See Admission Goals/Outcomes  See Admission  Goals/Outcomes  See Admission Goals/Outcomes       Core Components/Risk Factors/Patient Goals at Discharge (Final Review):  Goals and Risk Factor Review - 03/24/18 1021      Core Components/Risk Factors/Patient Goals Review   Personal Goals Review  Improve shortness of breath with ADL's;Tobacco Cessation;Develop more efficient breathing techniques such as purse lipped breathing and diaphragmatic breathing and practicing self-pacing with activity.;Diabetes;Increase knowledge of respiratory medications and ability to use respiratory devices properly.    Review  Pt has completed 6 exerercise session.  Last exercise on 9/17.  Remarked to MD during office vistit, that we worked him too hard.  Pt average met 1.5. Pt reports no change in tobacco usage.  Pt denies any shortness of breath and generally likes to come to exercise.  Pt interacts with staff and is showing improvement of diabetes managment.  Willl contact pt to let him know due to his non attendance, planning to discharge pt from pulmonary rehab.    Expected Outcomes  See Admission Goals/Outcomes       ITP Comments: ITP Comments    Row Name 01/26/18 1236 02/24/18 1652          ITP Comments  Dr. Manfred Arch, Medical Director   Dr. Manfred Arch, Medical Director Pulmonary Rehab         Comments: Patient last exercised on 9/17. Pt completed 6 exercise sessions since 8/21. Planning to discharge pt from program. Maurice Small RN, BSN Cardiac and Pulmonary Rehab Nurse Navigator

## 2018-03-25 ENCOUNTER — Ambulatory Visit (HOSPITAL_COMMUNITY)
Admission: RE | Admit: 2018-03-25 | Discharge: 2018-03-25 | Disposition: A | Discharge status: Home / Self Care | Payer: Medicare Other | Source: Ambulatory Visit | Attending: Internal Medicine | Admitting: Internal Medicine

## 2018-03-25 ENCOUNTER — Ambulatory Visit: Payer: Self-pay | Admitting: Internal Medicine

## 2018-03-25 DIAGNOSIS — I482 Chronic atrial fibrillation, unspecified: Secondary | ICD-10-CM | POA: Diagnosis not present

## 2018-03-25 DIAGNOSIS — I5022 Chronic systolic (congestive) heart failure: Secondary | ICD-10-CM | POA: Diagnosis not present

## 2018-03-25 LAB — BASIC METABOLIC PANEL
Anion gap: 8 (ref 5–15)
BUN: 29 mg/dL — AB (ref 8–23)
CALCIUM: 9.4 mg/dL (ref 8.9–10.3)
CO2: 31 mmol/L (ref 22–32)
CREATININE: 1.26 mg/dL — AB (ref 0.61–1.24)
Chloride: 98 mmol/L (ref 98–111)
GFR calc Af Amer: >60 mL/min (ref >60)
GFR, EST NON AFRICAN AMERICAN: 56 mL/min — AB (ref >60)
GLUCOSE: 234 mg/dL — AB (ref 70–99)
Potassium: 4 mmol/L (ref 3.5–5.1)
SODIUM: 137 mmol/L (ref 135–145)

## 2018-03-28 ENCOUNTER — Ambulatory Visit (INDEPENDENT_AMBULATORY_CARE_PROVIDER_SITE_OTHER): Payer: Medicare Other | Admitting: Internal Medicine

## 2018-03-28 ENCOUNTER — Encounter: Payer: Self-pay | Admitting: Internal Medicine

## 2018-03-28 VITALS — BP 120/70 | HR 78 | Ht 65.0 in | Wt 169.8 lb

## 2018-03-28 DIAGNOSIS — J449 Chronic obstructive pulmonary disease, unspecified: Secondary | ICD-10-CM | POA: Diagnosis not present

## 2018-03-28 DIAGNOSIS — F17210 Nicotine dependence, cigarettes, uncomplicated: Secondary | ICD-10-CM | POA: Diagnosis not present

## 2018-03-28 DIAGNOSIS — J9612 Chronic respiratory failure with hypercapnia: Secondary | ICD-10-CM | POA: Diagnosis not present

## 2018-03-28 DIAGNOSIS — J9611 Chronic respiratory failure with hypoxia: Secondary | ICD-10-CM
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
## 2018-03-28 NOTE — Patient Instructions (Addendum)
No change in medications or night time ventilator settings or nocturnal 02 for now  No need for 02 during the day at this point - ok to use only  if you feel you need it, not automatically    The key is to stop smoking completely before smoking completely stops you!    Please schedule a follow up visit in 3 months but call sooner if needed

## 2018-03-29 ENCOUNTER — Encounter: Payer: Self-pay | Admitting: Internal Medicine

## 2018-03-29 ENCOUNTER — Encounter (HOSPITAL_COMMUNITY): Payer: Medicare Other

## 2018-03-29 IMAGING — DX DG CHEST 2V
2 series · 2 of 2 positions shown · non-contrast
Comparison: 06/06/2017 chest radiograph.

CLINICAL DATA: COPD, cough

EXAM:
CHEST  2 VIEW

[chest pa]
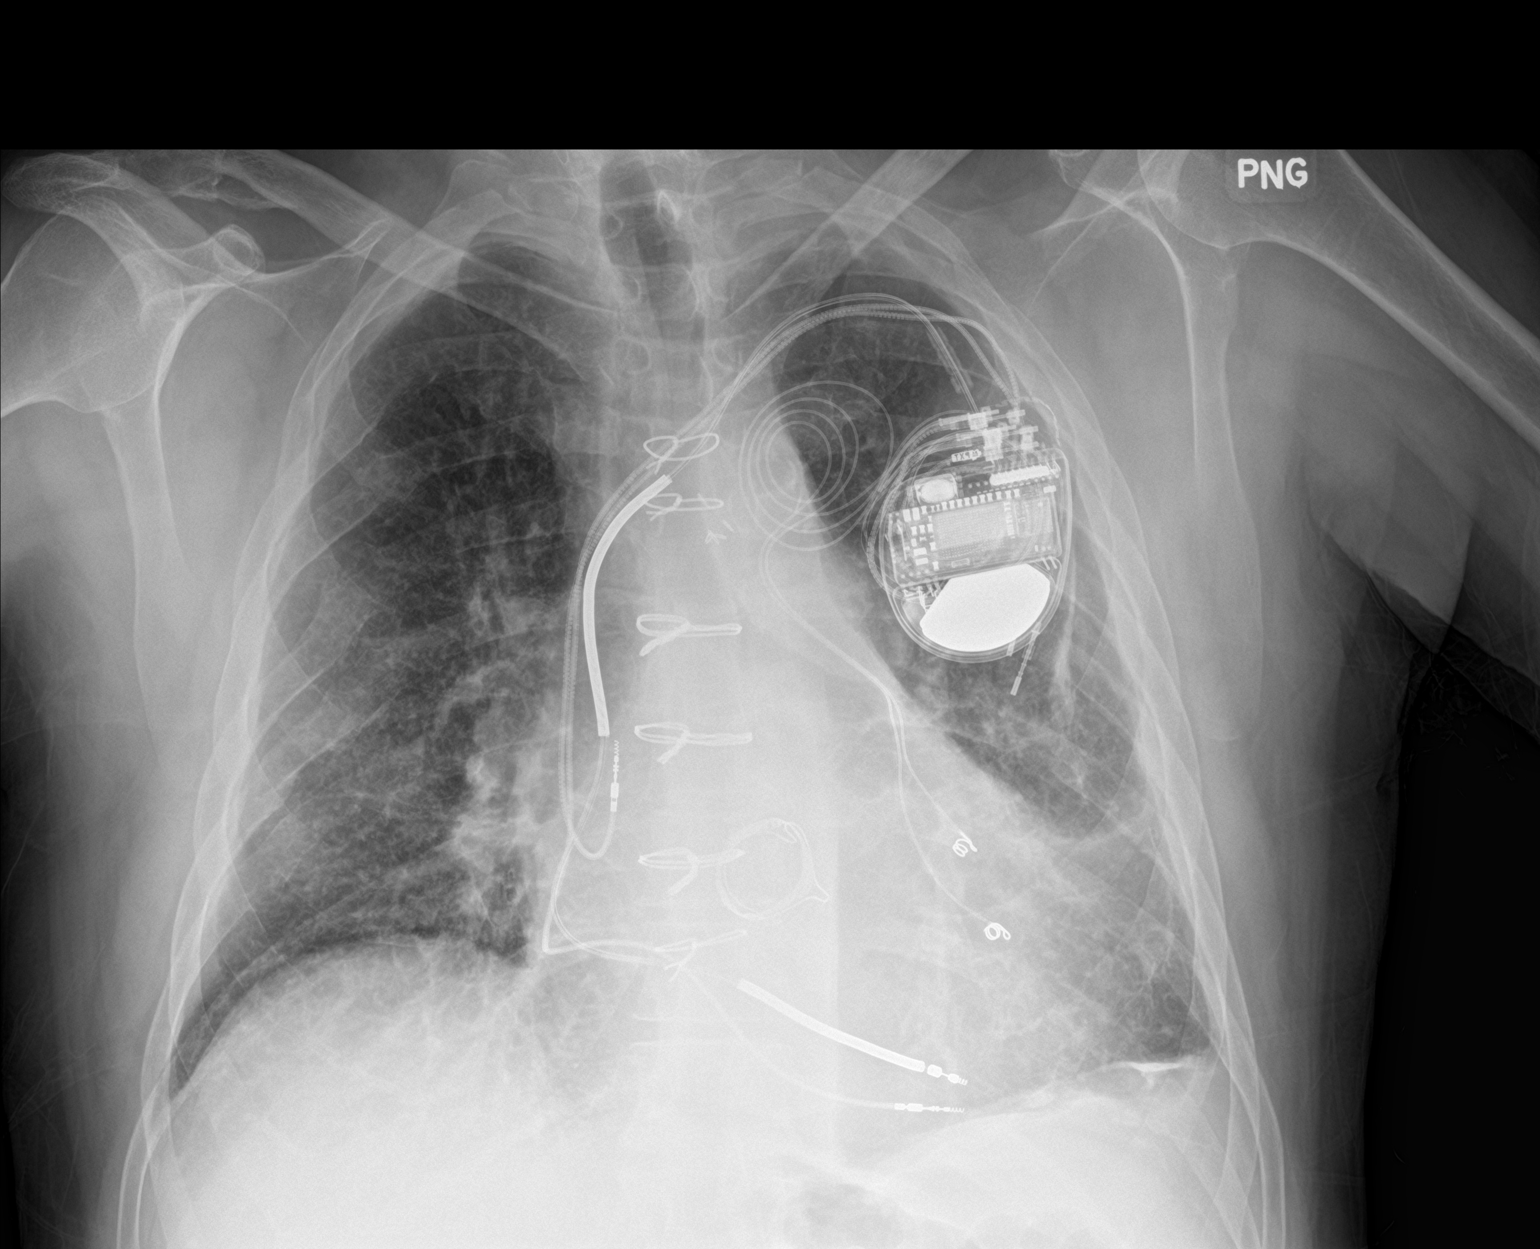

[chest lat]
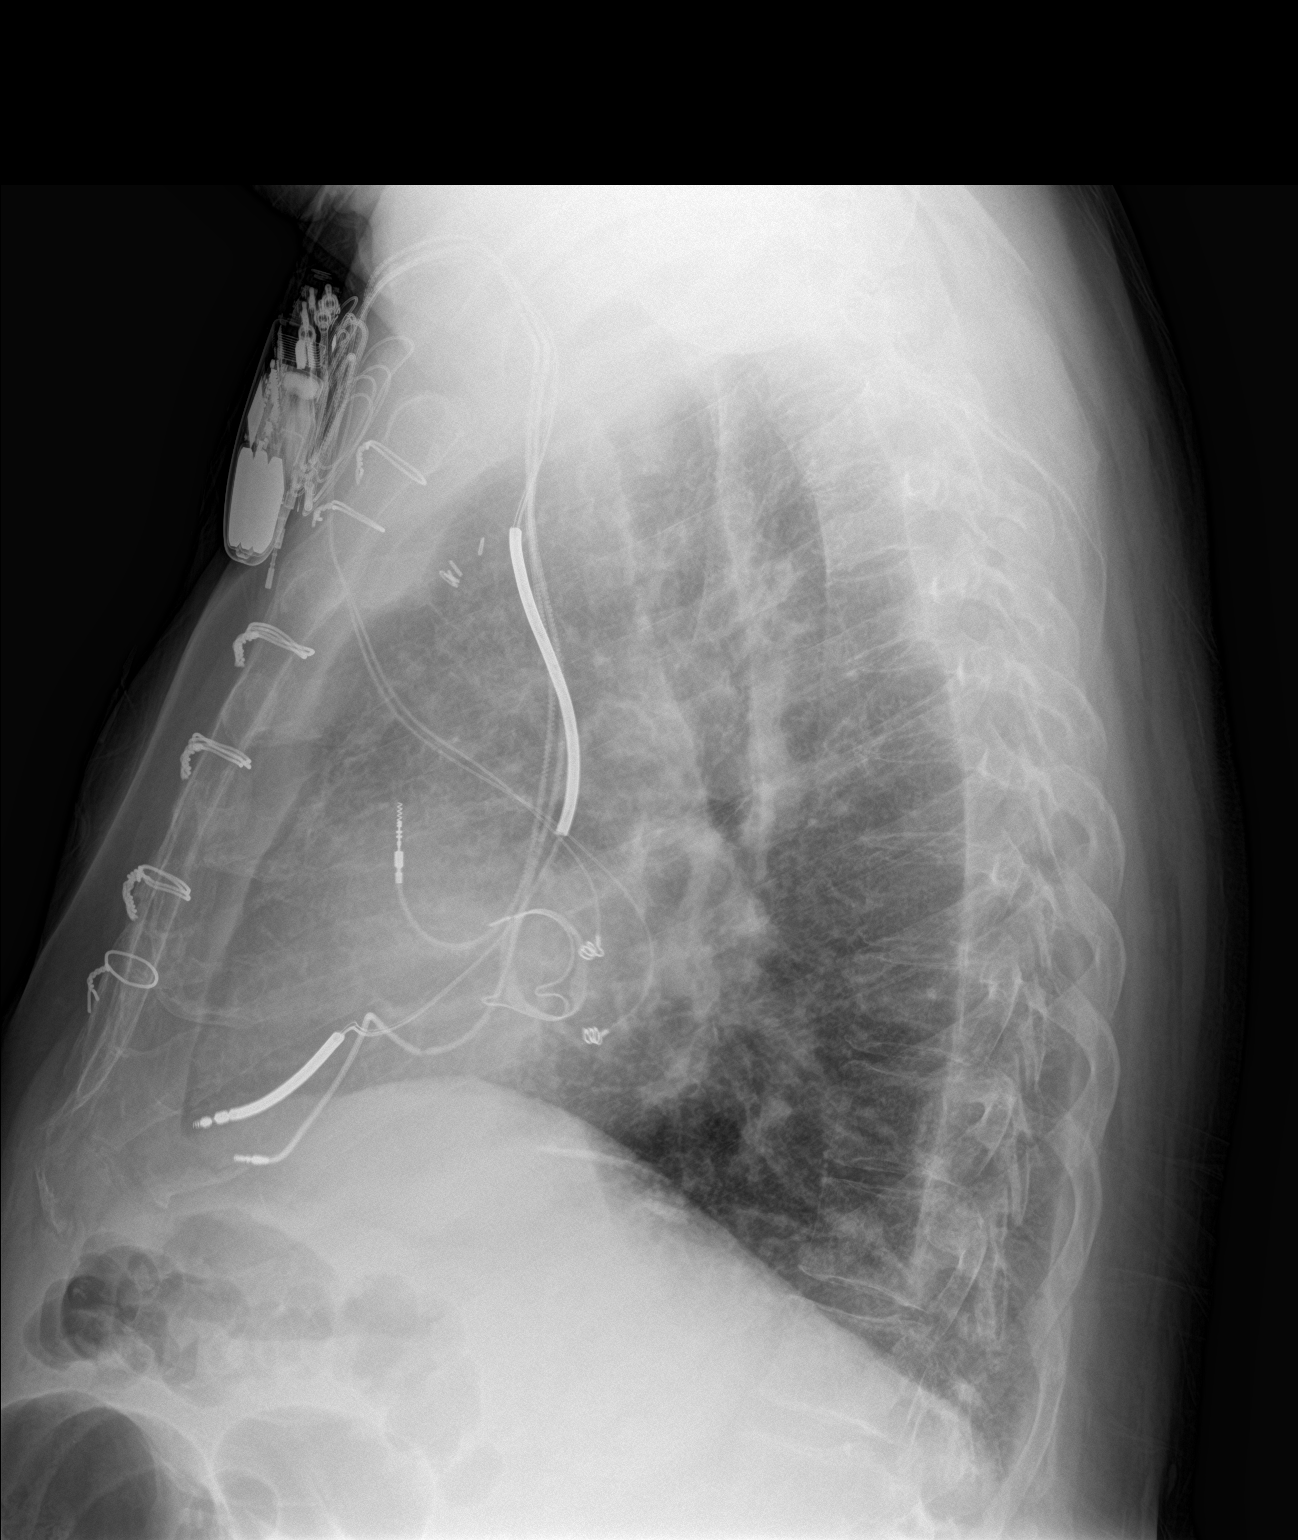

[2 of 2 positions shown; findings below may reference images not displayed]

FINDINGS: Stable configuration of 3 lead left subclavian ICD with 2 epicardial
leads. Stable configuration of cardiac valvular prosthesis and
intact sternotomy wires. Stable cardiomediastinal silhouette with
borderline mild cardiomegaly. No pneumothorax. No pleural effusion.
Stable curvilinear opacities at both lung bases, compatible with
mild scarring versus atelectasis. Overt pulmonary edema. No acute
consolidative airspace disease. Borderline mild lung hyperinflation.
IMPRESSION: 1. No acute cardiopulmonary disease.
2. Stable borderline mild cardiomegaly without overt pulmonary
edema.
3. Stable mild bibasilar scarring versus atelectasis.
4. Borderline mild lung hyperinflation, compatible with the provided
history of COPD.

## 2018-03-29 NOTE — Assessment & Plan Note (Addendum)
4-5 min discussion re active cigarette smoking in addition to office E&M  Ask about tobacco use:   ongoing Advise quitting    I reviewed the Fletcher curve with the patient that basically indicates  if you quit smoking when your best day FEV1 is still well preserved (as is still surprisingly the case here)  it is highly unlikely you will progress to severe disease and informed the patient there was  no medication on the market that has proven to alter the curve/ its downward trajectory  or the likelihood of progression of their disease(unlike other chronic medical conditions such as atheroclerosis where we do think we can change the natural hx with risk reducing meds)    Therefore stopping smoking and maintaining abstinence are  the most important aspects of care, not choice of inhalers or for that matter, doctors.   Treatment other than smoking cessation  is entirely directed by severity of symptoms and focused also on reducing exacerbations, not attempting to change the natural history of the disease.  . Assess willingness:  Not committed at this point Assist in quit attempt:  Per PCP when ready Arrange follow up:   Follow up per Primary Care planned        I had an extended discussion with the patient/daughter reviewing all relevant studies completed to date and  lasting 15 to 20 minutes of a 25 minute visit    Each maintenance medication was reviewed in detail including most importantly the difference between maintenance and prns and under what circumstances the prns are to be triggered using an action plan format that is not reflected in the computer generated alphabetically organized AVS.     Please see AVS for specific instructions unique to this visit that I personally wrote and verbalized to the the pt in detail and then reviewed with pt  by my nurse highlighting any  changes in therapy recommended at today's visit to their plan of care.

## 2018-03-29 NOTE — Assessment & Plan Note (Addendum)
HC03  06/13/17 = 39  - PSS 12/03/17 c/w no osa/ noct desats corrected on 2lpm s bipap - HC03 12/13/2017  = 40  - HC02  12/20/17   = 34  - HCO3 03/25/18 = 31 c/w improving hypercarbia  - 03/28/2018  Walked RA x 3 laps @ 185 ft each stopped due to  End of study, slow pace, no sob or desat  < 90% so rec 02 prn daytime / continue 2lpm hs on NIV

## 2018-03-29 NOTE — Assessment & Plan Note (Signed)
Spirometry 10/11/15  Restrictive only but did not breath out the full 6 secs and does have mild curvature on F/V - bipap hs since d/c 11/03/17 for hypercabia (see separate a/p)  - PFT's  12/23/2017  FEV1 1.00 (40 % ) ratio 81  p no % improvement from saba p brovana  prior to study with DLCO  30 % corrects to 92  % for alv volume  / only mild curvature - Rehab to start late July 2019    Although technically GOLD 0,  Pt is Group D in terms of symptom/risk and laba/ICS therefore appropriate rx at this point though could simplify rx over time if he first quits smoking (see separate a/p)   For now continue bud/formterol bid   F/u q 3 m

## 2018-03-31 ENCOUNTER — Encounter (HOSPITAL_COMMUNITY): Payer: Medicare Other

## 2018-04-05 ENCOUNTER — Encounter (HOSPITAL_COMMUNITY): Payer: Medicare Other

## 2018-04-05 DIAGNOSIS — J449 Chronic obstructive pulmonary disease, unspecified: Secondary | ICD-10-CM | POA: Diagnosis not present

## 2018-04-05 DIAGNOSIS — I259 Chronic ischemic heart disease, unspecified: Secondary | ICD-10-CM | POA: Diagnosis not present

## 2018-04-07 ENCOUNTER — Encounter (HOSPITAL_COMMUNITY): Payer: Medicare Other

## 2018-04-07 DIAGNOSIS — I259 Chronic ischemic heart disease, unspecified: Secondary | ICD-10-CM | POA: Diagnosis not present

## 2018-04-07 DIAGNOSIS — I5043 Acute on chronic combined systolic (congestive) and diastolic (congestive) heart failure: Secondary | ICD-10-CM | POA: Diagnosis not present

## 2018-04-10 DIAGNOSIS — J449 Chronic obstructive pulmonary disease, unspecified: Secondary | ICD-10-CM | POA: Diagnosis not present

## 2018-04-10 DIAGNOSIS — I259 Chronic ischemic heart disease, unspecified: Secondary | ICD-10-CM | POA: Diagnosis not present

## 2018-04-11 NOTE — Progress Notes (Signed)
HF MD: [facility]  HPI:  72 y.o.male with history of chronic atrial fibrillation with AV nodal ablation and BiV pacing, mixed ischemic/nonischemic cardiomyopathy, chronic ETOH abuse, MV replacement x 2, and CAD. He was hospitalized with acute on chronic systolic CHF in 9/15. He had not been taking any medications. CTA chest negative for PE. He was diuresed and started back on cardiac meds. He was discharged to live with his daughter. He has quit drinking since living with his daughter. He was admitted again in 3/16 with NSTEMI, CHF. LHC showed moderate distal LM disease (6.9 mm2 by IVUS), 80% ostial LCx stenosis, and total occlusion RCA with collaterals. Ostial LCx was poor target for intervention, he was managed medically.   Admitted to [facility] 9/25 through 03/10/16 for acute on chronic hypoxemic and hypercapnic respiratory failure, exacerbation of COPD, and acute on chronic systolic heart failure. Patient was evaluated by cardiology during his hospitalization. He was diuresed with IV diuretics, which was then transitioned to oral diuretic 40 mg lasix daily. Patient continued to diurese well. He did receive steroids as well as antibiotics for his acute COPD exacerbation, which have been completed at the time of discharge. Discharge weight was 161 pounds.   Echo in 10/18 showed EF 25-30%, moderately decreased RV systolic function, stable bioprosthetic mitral valve.   He was admitted again in 1/19 with CHF exacerbation. RHC/LHC showed 90% distal LM, chronic totally occluded pRCA. He is not a CABG candidate with severe COPD. Vascular studies showed severe PAD so he is a poor Impella candidate. After consultation with the interventional cardiology service, we decided that he would be a poor LM PCI candidate (unable to place Impella). He was diuresed this admission and Entresto was stopped due to hypotension.   He was admitted in 5/19 with acute on chronic systolic CHF and encephalopathy.   Echo in 5/19 showed EF 30-35%, mild LV dilation, normal bioprosthetic mitral valve, moderately decreased RV systolic function.  He was diuresed.  He developed progressive lethargy and had to be intubated for airway protection.  He was started on lactulose for suspected hepatic encephalopathy and after extubation, was on Bipap at night for hypercarbic respiratory failure.   He returns for pharmacist-led HF medication titration with his daughter/caregiver. At last HF clinic visit on 10/3, his spironolactone was increased to 25 mg daily. He reports feeling well but is frustrated that he has been having accidents recently with lactulose. He is not smoking. Less sleepy now that he is using CPAP at night. Doing pulmonary rehab.  He continues to use home oxygen 1.5 L .  Pulmonary rehab did walk test last week and he was able to keep his O2 sats at 94% off of O2 so just using as needed now. No dyspnea walking on flat ground, gets short of breath walking up stairs.  No lightheadedness.  No chest pain.  No orthopnea/PND.     Marland Kitchen Shortness of breath/dyspnea on exertion? Yes - only needs O2 as needed -- did walk test last week and was able to keep sats 94% without O2  . Orthopnea/PND? no . Edema? no . Lightheadedness/dizziness? no . Daily weights at home? no . Blood pressure/heart rate monitoring at home? no . Following low-sodium/fluid-restricted diet? yes  HF Medications: Carvedilol 6.25 mg PO BID Digoxin 0.125 mg PO daily Losartan 25 mg PO daily  Spironolactone 25 mg PO daily Torsemide 80 mg PO daily   Has the patient been experiencing any side effects to the medications prescribed?  no  Does the patient have any problems obtaining medications due to transportation or finances?   No - [employer] Medicare  Understanding of regimen: fair Understanding of indications: fair Potential of compliance: fair Patient understands to avoid NSAIDs. Patient understands to avoid decongestants.    Pertinent Lab  Values: . 04/12/2018: Serum creatinine 1.38 (BL 1.1-1.3), BUN 32, Potassium 4.8, Sodium 134 . 03/10/2018: Digoxin 0.9   Vital Signs: . Weight: 163 lb (dry weight: 160 lb) . Blood pressure: 110/60 mmHg  . Heart rate: 56 bpm    Assessment: 1. Chronic systolic CHF (EF 78-93>>81-01%), due to ICM/NICM (ETOH). NYHA class II symptoms.  - Volume status stable  - Increase losartan to 50 mg daily  (has not tolerated Entresto in the past due to low BP)  - Continue carvedilol 6.25 mg BID, digoxin 0.125 mg daily, spironolactone 25 mg daily and torsemide 80 mg daily  - Basic disease state pathophysiology, medication indication, mechanism and side effects reviewed at length with patient and he verbalized understanding  2. Atrial fibrillation:Chronic,s/p AV nodal ablation. - Continue Eliquis for anticoagulation.  3. PAD:No claudication symptoms.  - Follows at [facility].   4. CAD:No chest pain. Planning to medically manage LM stenosis =>not a CABG candidate due to severe COPD and not PCI candidate due to inability to use Impella (have reviewed extensively with interventional cardiology). - No ASA with stable CAD and need for Eliquis.  - Continue statin, good lipids 6/19.    5. Mitral valve replacement:Bioprosthetic valve currently, originally Bjork-Shiley. Valve looked ok on 5/19echo.   6. ETOH abuse:Has quit drinking.  7. COPD:On home oxygen, severe. Has now quit smoking.   8. OSA: Hypercarbic respiratory failure in the hospital. 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    - Having lots of accidents recently, will check ammonia level today to see if we can reduce lactulose dose    Plan: 1) Medication changes: Based on clinical presentation, vital signs and recent labs will increase losartan to 50 mg daily 2) Labs: BMET and ammonia today  3) Follow-up: Dr. Shirlee Latch on 06/10/18   Tyler Deis. Bonnye Fava, PharmD, BCPS, CPP Clinical Pharmacist Phone:  [phone] 04/11/2018 10:45 AM

## 2018-04-12 ENCOUNTER — Ambulatory Visit (HOSPITAL_COMMUNITY)
Admission: RE | Admit: 2018-04-12 | Discharge: 2018-04-12 | Disposition: A | Discharge status: Home / Self Care | Payer: Medicare Other | Source: Ambulatory Visit | Attending: Cardiology | Admitting: Cardiology

## 2018-04-12 ENCOUNTER — Telehealth (HOSPITAL_COMMUNITY): Payer: Self-pay | Admitting: Pharmacist

## 2018-04-12 ENCOUNTER — Encounter (HOSPITAL_COMMUNITY): Payer: Medicare Other

## 2018-04-12 VITALS — BP 110/60 | HR 56 | Wt 163.0 lb

## 2018-04-12 DIAGNOSIS — Z87891 Personal history of nicotine dependence: Secondary | ICD-10-CM | POA: Diagnosis not present

## 2018-04-12 DIAGNOSIS — G4733 Obstructive sleep apnea (adult) (pediatric): Secondary | ICD-10-CM | POA: Insufficient documentation

## 2018-04-12 DIAGNOSIS — K7460 Unspecified cirrhosis of liver: Secondary | ICD-10-CM | POA: Insufficient documentation

## 2018-04-12 DIAGNOSIS — F1010 Alcohol abuse, uncomplicated: Secondary | ICD-10-CM | POA: Diagnosis not present

## 2018-04-12 DIAGNOSIS — I2510 Atherosclerotic heart disease of native coronary artery without angina pectoris: Secondary | ICD-10-CM | POA: Insufficient documentation

## 2018-04-12 DIAGNOSIS — I482 Chronic atrial fibrillation, unspecified: Secondary | ICD-10-CM | POA: Insufficient documentation

## 2018-04-12 DIAGNOSIS — Z79899 Other long term (current) drug therapy: Secondary | ICD-10-CM | POA: Insufficient documentation

## 2018-04-12 DIAGNOSIS — I739 Peripheral vascular disease, unspecified: Secondary | ICD-10-CM | POA: Diagnosis not present

## 2018-04-12 DIAGNOSIS — J449 Chronic obstructive pulmonary disease, unspecified: Secondary | ICD-10-CM | POA: Insufficient documentation

## 2018-04-12 DIAGNOSIS — I5022 Chronic systolic (congestive) heart failure: Secondary | ICD-10-CM | POA: Diagnosis not present

## 2018-04-12 DIAGNOSIS — Z953 Presence of xenogenic heart valve: Secondary | ICD-10-CM | POA: Diagnosis not present

## 2018-04-12 DIAGNOSIS — K7290 Hepatic failure, unspecified without coma: Secondary | ICD-10-CM | POA: Diagnosis not present

## 2018-04-12 DIAGNOSIS — Z9981 Dependence on supplemental oxygen: Secondary | ICD-10-CM | POA: Insufficient documentation

## 2018-04-12 DIAGNOSIS — I252 Old myocardial infarction: Secondary | ICD-10-CM | POA: Insufficient documentation

## 2018-04-12 DIAGNOSIS — I428 Other cardiomyopathies: Secondary | ICD-10-CM | POA: Diagnosis not present

## 2018-04-12 DIAGNOSIS — I255 Ischemic cardiomyopathy: Secondary | ICD-10-CM

## 2018-04-12 LAB — BASIC METABOLIC PANEL
ANION GAP: 9 (ref 5–15)
BUN: 32 mg/dL — ABNORMAL HIGH (ref 8–23)
CALCIUM: 9.4 mg/dL (ref 8.9–10.3)
CHLORIDE: 99 mmol/L (ref 98–111)
CO2: 26 mmol/L (ref 22–32)
Creatinine, Ser: 1.38 mg/dL — ABNORMAL HIGH (ref 0.61–1.24)
GFR calc non Af Amer: 50 mL/min — ABNORMAL LOW (ref >60)
GFR, EST AFRICAN AMERICAN: 58 mL/min — AB (ref >60)
Glucose, Bld: 405 mg/dL — ABNORMAL HIGH (ref 70–99)
Potassium: 4.8 mmol/L (ref 3.5–5.1)
Sodium: 134 mmol/L — ABNORMAL LOW (ref 135–145)

## 2018-04-12 LAB — AMMONIA: Ammonia: 41 umol/L — ABNORMAL HIGH (ref 9–35)

## 2018-04-12 MED ORDER — LOSARTAN POTASSIUM 50 MG PO TABS: 50.0000 mg | ORAL_TABLET | Freq: Every day | ORAL | 5 refills | Status: DC

## 2018-04-12 MED ORDER — LACTULOSE 10 GM/15ML PO SOLN: ORAL | 6 refills | Status: DC

## 2018-04-12 NOTE — Patient Instructions (Addendum)
Please INCREASE losartan to 50 mg (1 tablet) ONCE DAILY.   Blood work today. We will call you with any changes.   You are scheduled to see Dr. Shirlee Latch again on 06/10/18.

## 2018-04-12 NOTE — Telephone Encounter (Signed)
[name] has been having severe diarrhea with lactulose. Per discussion with Dr. Shirlee Latch, will reduce lactulose dose to 30 mL BID with recheck ammonia in 1 week. Will also recheck BMET at that time since losartan was increased today. Left VM with daughter [name] to review dose change and scheduled lab work.   [name]. Bonnye Fava, PharmD, BCPS, CPP Clinical Pharmacist Phone: [phone] 04/12/2018 2:11 PM

## 2018-04-14 ENCOUNTER — Encounter (HOSPITAL_COMMUNITY): Payer: Medicare Other

## 2018-04-17 DIAGNOSIS — J449 Chronic obstructive pulmonary disease, unspecified: Secondary | ICD-10-CM | POA: Diagnosis not present

## 2018-04-17 DIAGNOSIS — I259 Chronic ischemic heart disease, unspecified: Secondary | ICD-10-CM | POA: Diagnosis not present

## 2018-04-19 ENCOUNTER — Encounter (HOSPITAL_COMMUNITY): Payer: Medicare Other

## 2018-04-20 ENCOUNTER — Other Ambulatory Visit (HOSPITAL_COMMUNITY): Payer: Self-pay | Admitting: Pharmacist

## 2018-04-20 MED ORDER — EMPAGLIFLOZIN 10 MG PO TABS: 10.0000 mg | ORAL_TABLET | Freq: Every day | ORAL | 5 refills | Status: DC

## 2018-04-21 ENCOUNTER — Other Ambulatory Visit (HOSPITAL_COMMUNITY): Payer: Self-pay

## 2018-04-21 ENCOUNTER — Encounter (HOSPITAL_COMMUNITY): Payer: Medicare Other

## 2018-04-21 MED ORDER — EMPAGLIFLOZIN 10 MG PO TABS: 10.0000 mg | ORAL_TABLET | Freq: Every day | ORAL | 0 refills | Status: DC

## 2018-04-23 DIAGNOSIS — I259 Chronic ischemic heart disease, unspecified: Secondary | ICD-10-CM | POA: Diagnosis not present

## 2018-04-23 DIAGNOSIS — J449 Chronic obstructive pulmonary disease, unspecified: Secondary | ICD-10-CM | POA: Diagnosis not present

## 2018-05-06 DIAGNOSIS — I259 Chronic ischemic heart disease, unspecified: Secondary | ICD-10-CM | POA: Diagnosis not present

## 2018-05-06 DIAGNOSIS — J449 Chronic obstructive pulmonary disease, unspecified: Secondary | ICD-10-CM | POA: Diagnosis not present

## 2018-05-06 NOTE — Addendum Note (Signed)
Encounter addended by: Enid Skeens, RD on: 05/06/2018 5:43 PM  Actions taken: Flowsheet data copied forward, Visit Navigator Flowsheet section accepted

## 2018-05-07 DIAGNOSIS — I259 Chronic ischemic heart disease, unspecified: Secondary | ICD-10-CM | POA: Diagnosis not present

## 2018-05-07 DIAGNOSIS — I5043 Acute on chronic combined systolic (congestive) and diastolic (congestive) heart failure: Secondary | ICD-10-CM | POA: Diagnosis not present

## 2018-05-10 DIAGNOSIS — J449 Chronic obstructive pulmonary disease, unspecified: Secondary | ICD-10-CM | POA: Diagnosis not present

## 2018-05-10 DIAGNOSIS — I259 Chronic ischemic heart disease, unspecified: Secondary | ICD-10-CM | POA: Diagnosis not present

## 2018-05-13 ENCOUNTER — Other Ambulatory Visit: Payer: Self-pay | Admitting: Cardiology

## 2018-05-17 DIAGNOSIS — I259 Chronic ischemic heart disease, unspecified: Secondary | ICD-10-CM | POA: Diagnosis not present

## 2018-05-17 DIAGNOSIS — J449 Chronic obstructive pulmonary disease, unspecified: Secondary | ICD-10-CM | POA: Diagnosis not present

## 2018-05-23 ENCOUNTER — Ambulatory Visit (INDEPENDENT_AMBULATORY_CARE_PROVIDER_SITE_OTHER): Payer: Medicare Other

## 2018-05-23 DIAGNOSIS — I495 Sick sinus syndrome: Secondary | ICD-10-CM

## 2018-05-23 DIAGNOSIS — I5022 Chronic systolic (congestive) heart failure: Secondary | ICD-10-CM | POA: Diagnosis not present

## 2018-05-23 NOTE — Progress Notes (Signed)
Remote ICD transmission.   

## 2018-05-24 ENCOUNTER — Encounter: Payer: Self-pay | Admitting: Cardiology

## 2018-05-30 IMAGING — CR DG ABDOMEN ACUTE W/ 1V CHEST
4 series · 4 of 4 positions shown · non-contrast
Comparison: Chest radiograph 12/19/2017

CLINICAL DATA: Abdominal pain.

EXAM:
DG ABDOMEN ACUTE W/ 1V CHEST

[w chest pa]
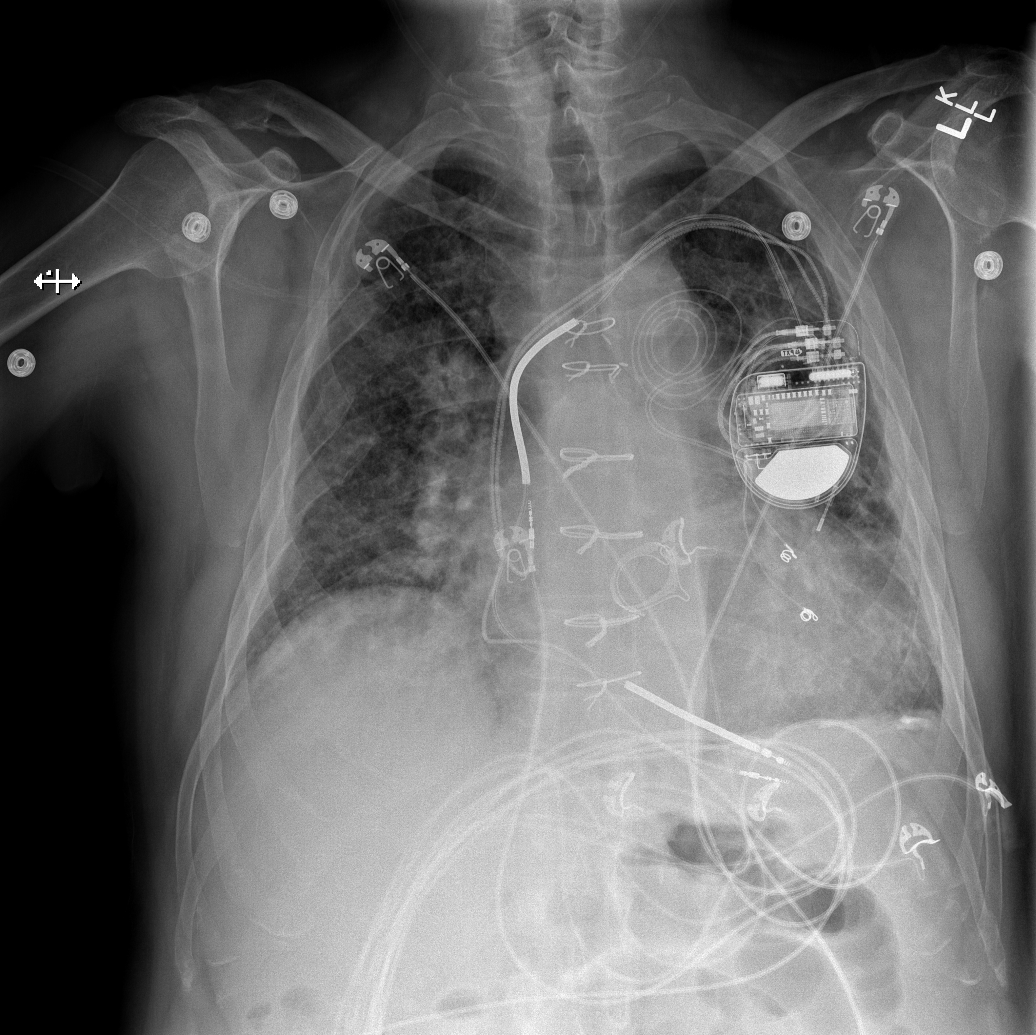

[w abdomen upright]
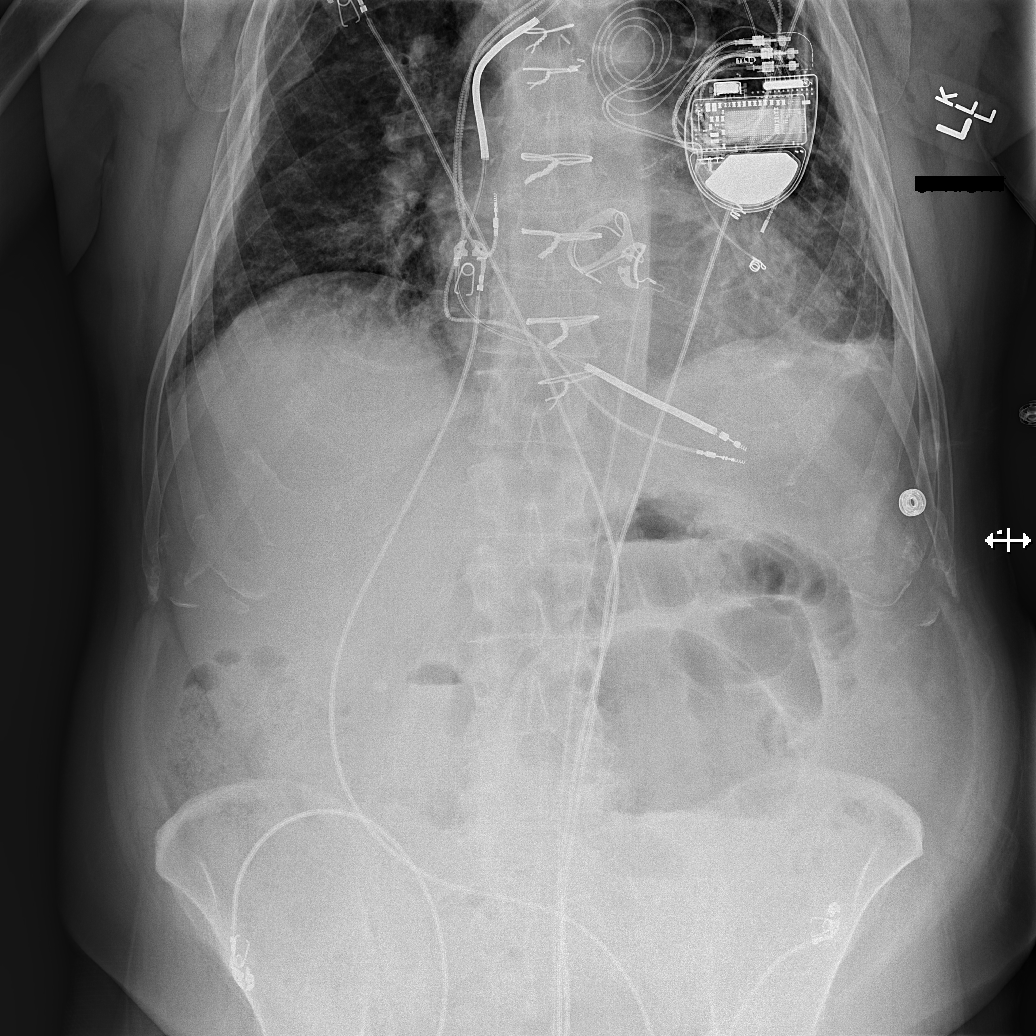

[t abdomen supine (1 of 2)]
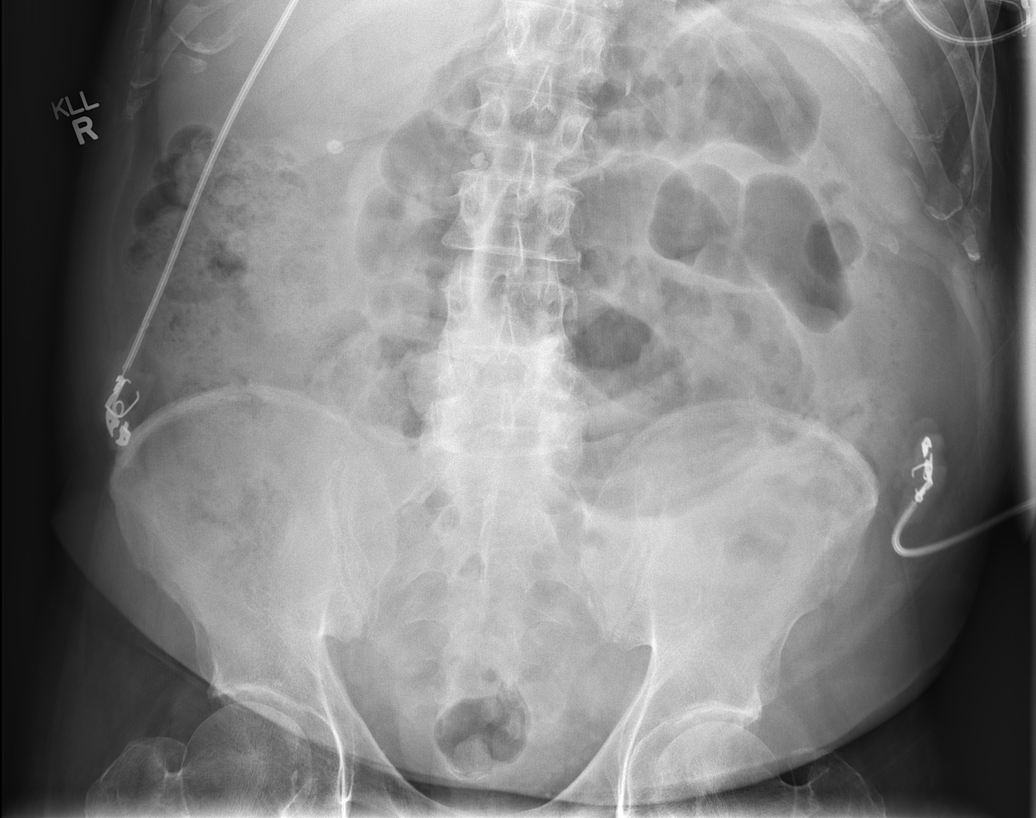

[t abdomen supine (2 of 2)]
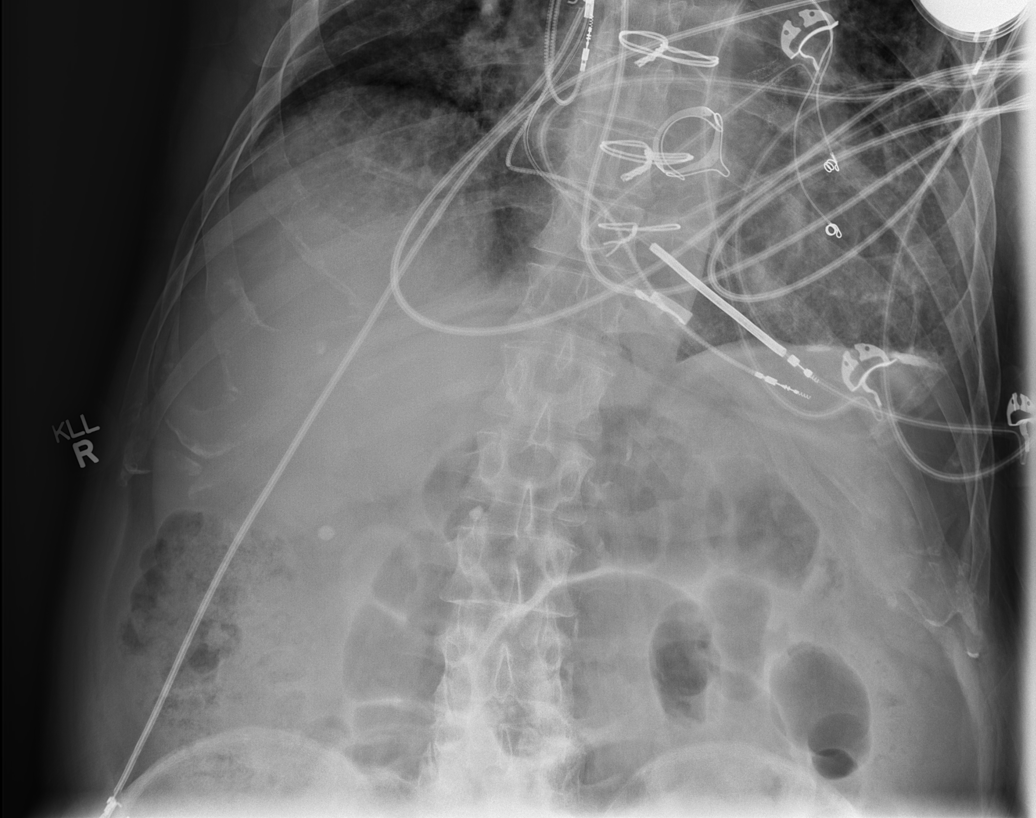

[4 of 4 positions shown; findings below may reference images not displayed]

FINDINGS: Post median sternotomy with prosthetic cardiac valve. Left-sided
pacemaker in place. Mild cardiomegaly is unchanged. Increased
interstitial opacities. Left basilar scarring.

Gaseous distention of bowel in the central and left abdomen may be
small bowel or colonic, with scattered air-fluid levels. No evidence
of free air. Moderate stool in the right colon. Round 7 mm
calcification in the right mid abdomen, with an additional
calcification projecting over the L1-L2 disc space..

Remote right rib fractures. No acute osseous abnormalities are seen.
IMPRESSION: 1. Gaseous distention of bowel loops in the central and left abdomen
which may be small bowel or colon with air-fluid levels. This may be
enteritis, ileus, or developing bowel obstruction. Recommend CT for
further evaluation.
2. Round 7mm calcification in the right mid abdomen may be a
gallstone, renal stone, or subcutaneous granuloma.
3. Mild cardiomegaly. Increased interstitial opacities in the lungs
suspicious for pulmonary edema.

## 2018-05-31 IMAGING — CT CT ABD-PELV W/ CM
2 of 5 series · 15 of 46 positions shown, 17 images · IV contrast (ISOVUE)
Comparison: Priors dating back through 08/18/2011

CLINICAL DATA: Abdominal pain and distention. No bowel movement for
2 days. Leukocytosis.

EXAM:
CT ABDOMEN AND PELVIS WITH CONTRAST
TECHNIQUE: Multidetector CT imaging of the abdomen and pelvis was performed
using the standard protocol following bolus administration of
intravenous contrast.
CONTRAST:  80mL [id number] IOPAMIDOL ([id number]) INJECTION 61%

[Series 2: axial st · axial · 0.78mm/px · z∈[-625,-230]mm · 12 of 91 slices shown, 14 images]
[im 6/91  soft-tissue]
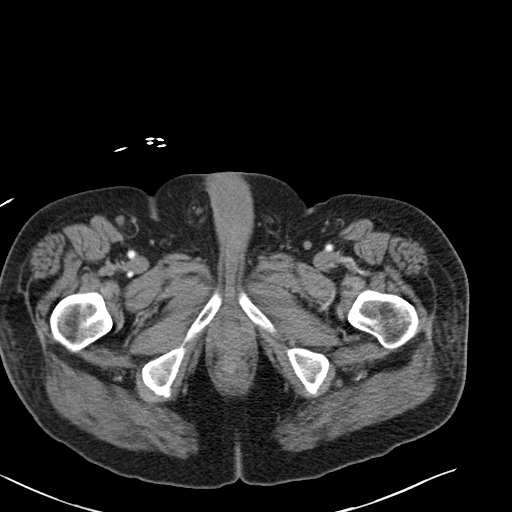
[im 6/91  bone]
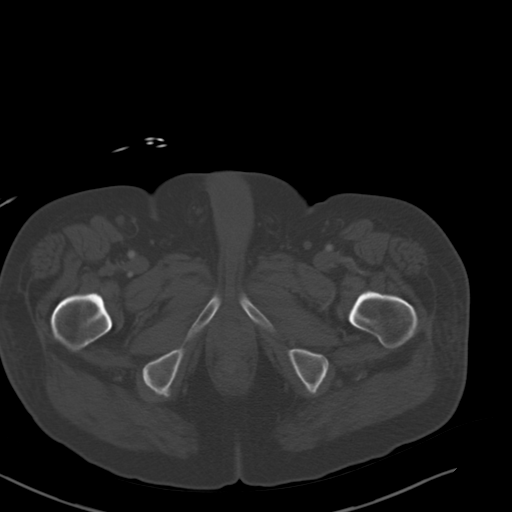
[im 16/91  soft-tissue]
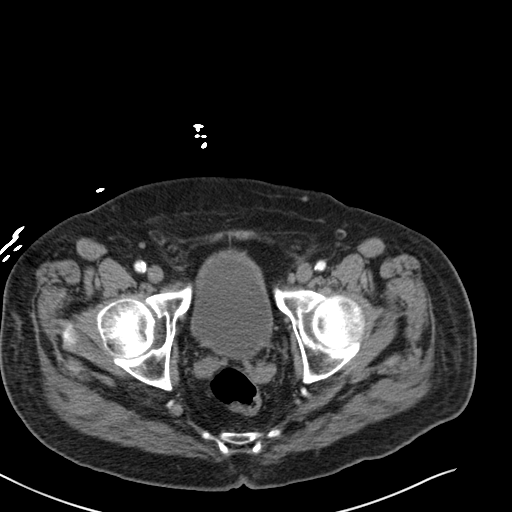
[im 22/91  soft-tissue]
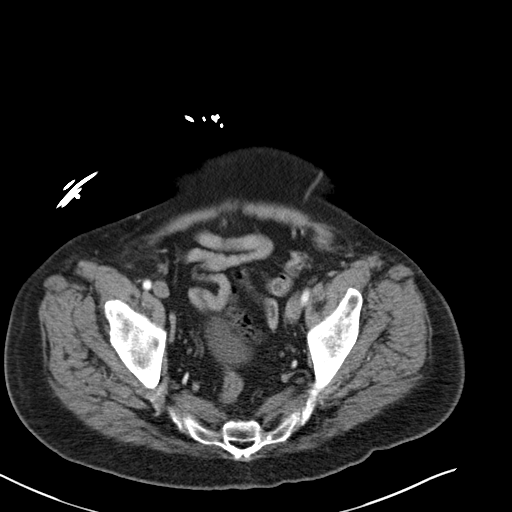
[im 27/91  soft-tissue]
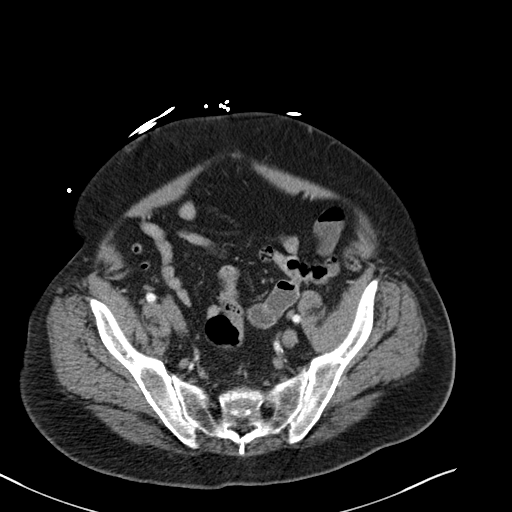
[im 38/91  soft-tissue]
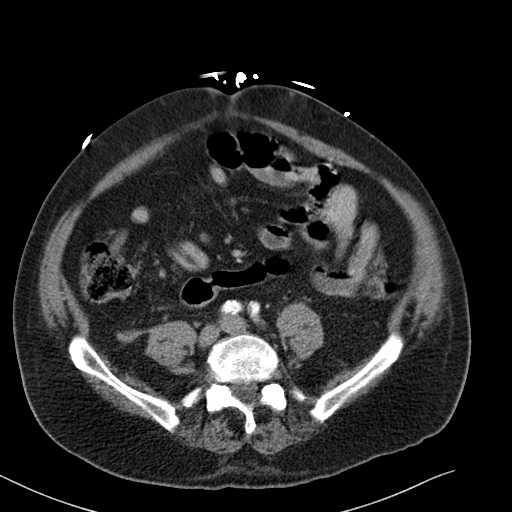
[im 43/91  soft-tissue]
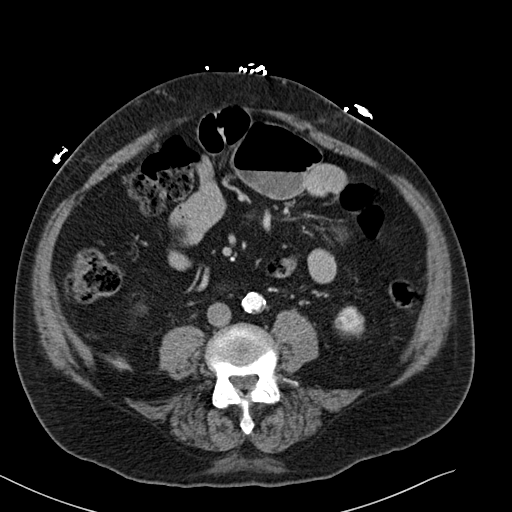
[im 48/91  soft-tissue]
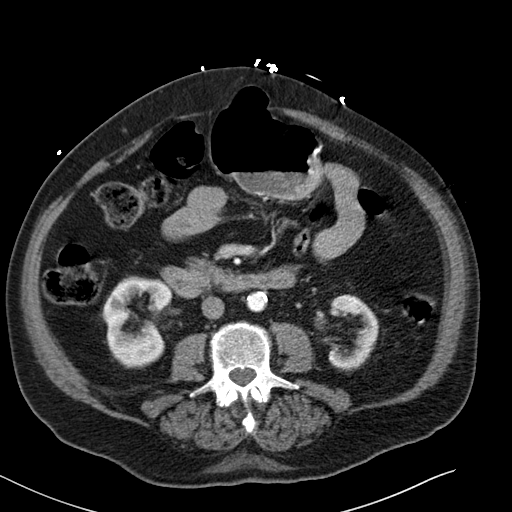
[im 59/91  soft-tissue]
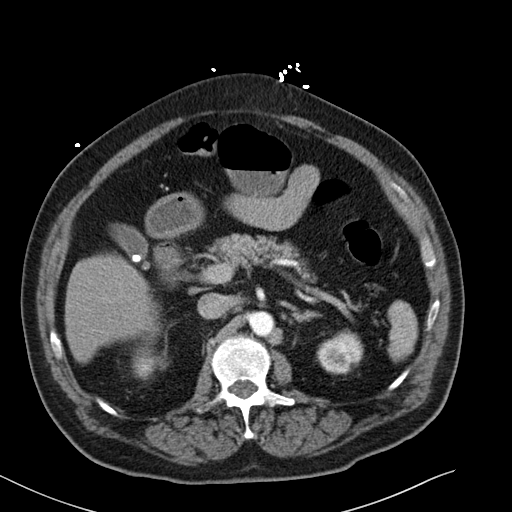
[im 64/91  soft-tissue]
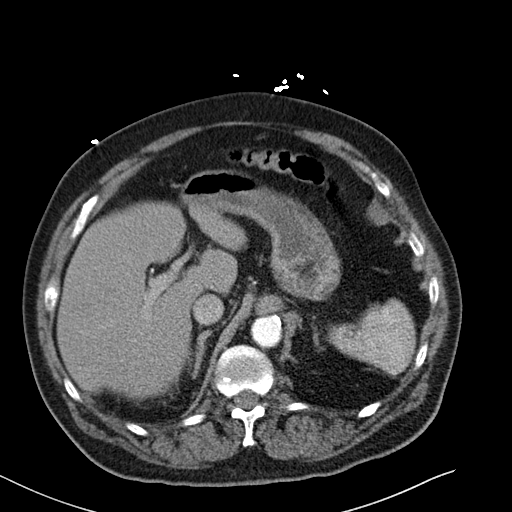
[im 64/91  bone]
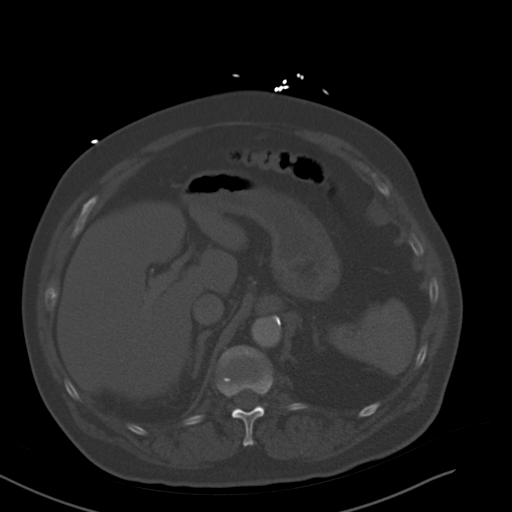
[im 69/91  soft-tissue]
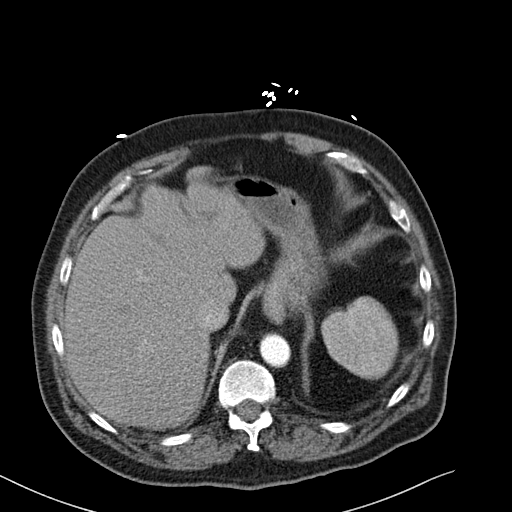
[im 80/91  soft-tissue]
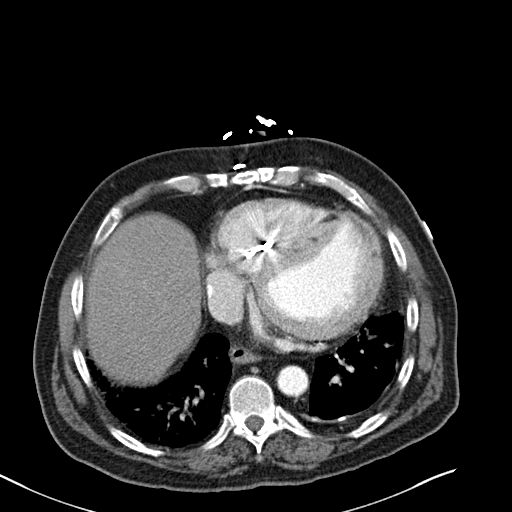
[im 85/91  soft-tissue]
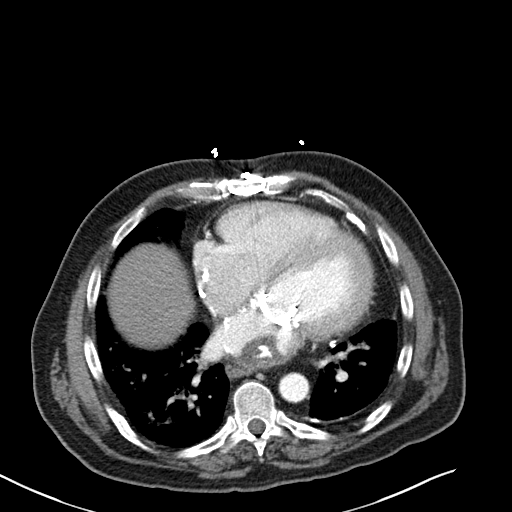

[Series 5: coronal st · coronal · 0.70mm/px · 3 of 105 slices shown]
[im 35/105  soft-tissue]
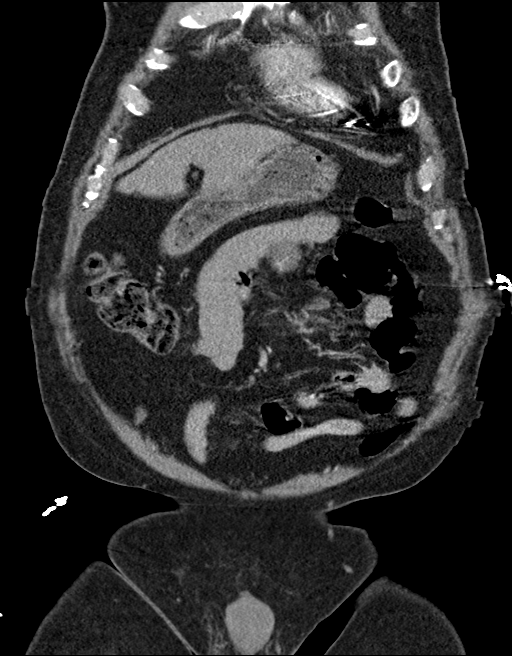
[im 47/105  soft-tissue]
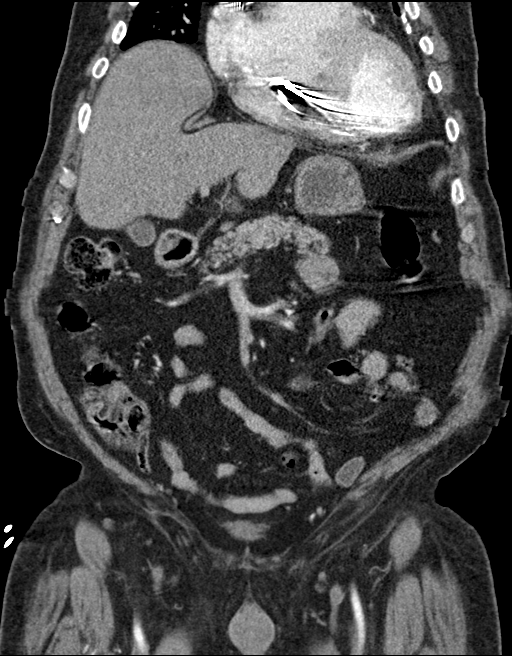
[im 58/105  soft-tissue]
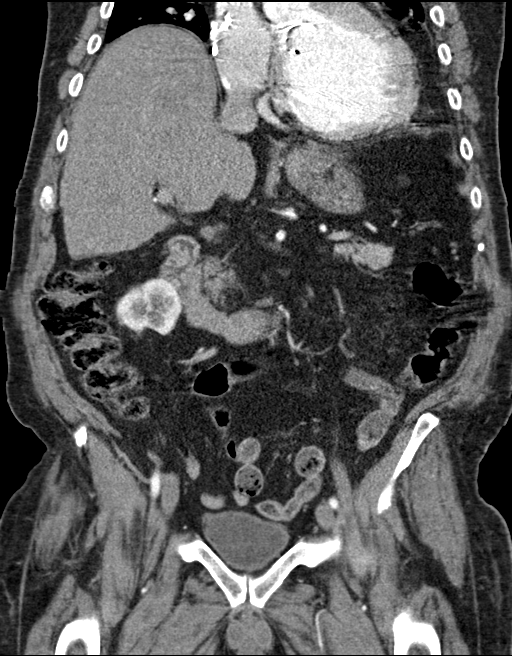

[15 of 46 positions shown; findings below may reference images not displayed]

FINDINGS: Lower chest: Cardiomegaly with post CABG change. Coronary
arteriosclerosis of the native coronary arteries. Pacer in ICD leads
in the right ventricle. Soft tissue partially calcified density
within the lumen of the left atrium measuring 3.5 x 2.2 x 4.8 cm,
chronic in appearance and dating back to previous comparison studies
to 4053. Differential considerations may include chronic thrombus or
possibly a left atrial myxoma. This is a chronic finding and
unchanged appearance. Mitral valvular replacement is noted. Vascular
congestion is noted of the included lung bases.

Hepatobiliary: Uncomplicated cholelithiasis with a 6 mm gallstone
seen along the dependent wall. Homogeneous appearance of the liver
without mass.

Pancreas: Normal

Spleen: Normal

Adrenals/Urinary Tract: Normal bilateral adrenal glands. Cortical
thinning and scarring bilaterally of the kidneys without obstructive
uropathy, nephrolithiasis or enhancing renal mass. The urinary
bladder is non distended which may account for the mild mural
thickening noted. No calculus is seen.

Stomach/Bowel: A small hiatal hernia is noted. The stomach is
decompressed in appearance. There is normal small bowel rotation
without small bowel dilatation or obstruction. An average amount of
fecal retention is seen within the colon with scattered colonic
diverticulosis noted along the ascending colon and cecum. No
evidence of acute diverticulitis.

An infraumbilical ventral Bachulia type hernia involving omental
fat and a short segment of the anti mesenteric wall of large bowel
is noted without incarceration.

Vascular/Lymphatic: Mild to moderate aortoiliac and branch vessel
atherosclerosis. No aneurysm. No lymphadenopathy.

Reproductive: Normal seminal vesicles and prostate.

Other: Small fat containing inguinal hernias. No ascites or free
air.

Musculoskeletal: Chronic Schmorl's node off the superior endplate of
L4 with degenerative disc disease L4-5 L5-S1. No acute nor
suspicious osseous abnormalities.
IMPRESSION: 1. Stable cardiomegaly with post CABG and mitral valvular
replacement. Intraluminal soft tissue density with calcification in
the left atrium may represent chronic mural thrombus versus possibly
left atrial myxoma. This is unchanged dating back to 4053.
2. Vascular congestion noted at the lung bases.
3. 6 mm gallstone without complication.
4. Infraumbilical ventral Dehade type hernia containing a short
segment of large bowel without incarceration. No bowel obstruction
or inflammation.
5. Mild to moderate aortoiliac and branch vessel atherosclerosis
without aneurysm.
6. Small fat containing inguinal hernias bilaterally

## 2018-06-05 DIAGNOSIS — I259 Chronic ischemic heart disease, unspecified: Secondary | ICD-10-CM | POA: Diagnosis not present

## 2018-06-05 DIAGNOSIS — J449 Chronic obstructive pulmonary disease, unspecified: Secondary | ICD-10-CM | POA: Diagnosis not present

## 2018-06-05 NOTE — Addendum Note (Signed)
Encounter addended by: Chelsea Aus, RN on: 06/05/2018 8:59 PM  Actions taken: Episode resolved, Flowsheet data copied forward, Visit Navigator Flowsheet section accepted, Clinical Note Signed

## 2018-06-05 NOTE — Progress Notes (Signed)
Discharge Progress Report  Patient Details  Name: [name] MRN: [id number] Date of Birth: July 30, 1945 Referring Provider:     Pulmonary Rehab Walk Test from 01/11/2018 in [place]  Referring Provider  Dr. Melvyn Novas       Number of Visits: 6 exercise sessions and 2 education classes  Reason for Discharge:  Early Exit:  Lack of attendance  Smoking History:  Social History   Tobacco Use  Smoking Status Current Some Day Smoker  . Packs/day: 2.00  . Years: 45.00  . Pack years: 90.00  . Types: Cigarettes  Smokeless Tobacco Never Used  Tobacco Comment   daughter states he cannot get to cigarettes now    Diagnosis:  Chronic respiratory failure with hypoxia and hypercapnia (HCC)  Chronic systolic congestive heart failure ([place])  ADL [facility]: Pulmonary Assessment Scores    Row Name 12/27/17 1415         ADL [facility]   ADL Phase  Entry     SOB Score total  24       CAT Score   CAT Score  16        Initial Exercise Prescription: Initial Exercise Prescription - 01/13/18 0800      Date of Initial Exercise RX and Referring Provider   Date  01/13/18    Referring Provider  Dr. Melvyn Novas      Oxygen   Oxygen  Continuous    Liters  3      NuStep   Level  2    SPM  80    Minutes  17    METs  1.5      Arm Ergometer   Level  2    Minutes  17      Track   Laps  5    Minutes  17      Prescription Details   Frequency (times per week)  2    Duration  Progress to 45 minutes of aerobic exercise without signs/symptoms of physical distress      Intensity   THRR 40-80% of Max Heartrate  60-120    Ratings of Perceived Exertion  11-13    Perceived Dyspnea  0-4      Progression   Progression  Continue progressive overload as per policy without signs/symptoms or physical distress.      Resistance Training   Training Prescription  Yes    Weight  blue bands    Reps  10-15       Discharge Exercise Prescription (Final Exercise Prescription  Changes): Exercise Prescription Changes - 02/22/18 1500      Response to Exercise   Blood Pressure (Admit)  112/70    Blood Pressure (Exercise)  142/82    Blood Pressure (Exit)  104/64    Heart Rate (Admit)  66 bpm    Heart Rate (Exercise)  102 bpm    Heart Rate (Exit)  60 bpm    Oxygen Saturation (Admit)  96 %    Oxygen Saturation (Exercise)  95 %    Oxygen Saturation (Exit)  98 %    Rating of Perceived Exertion (Exercise)  12    Perceived Dyspnea (Exercise)  2    Duration  Progress to 45 minutes of aerobic exercise without signs/symptoms of physical distress    Intensity  THRR unchanged      Progression   Progression  Continue to progress workloads to maintain intensity without signs/symptoms of physical distress.  Resistance Training   Training Prescription  Yes    Weight  blue bands    Reps  10-15    Time  10 Minutes      Oxygen   Oxygen  Continuous    Liters  2      NuStep   Level  2    SPM  80    Minutes  17    METs  1.5      Arm Ergometer   Level  3    Minutes  17      Track   Laps  7    Minutes  17       Functional Capacity: 6 Minute Walk    Row Name 01/13/18 0835         6 Minute Walk   Phase  Initial     Distance  1000 feet     Walk Time  6 minutes     # of Rest Breaks  0     MPH  1.89     METS  2.45     RPE  12     Perceived Dyspnea   0     Symptoms  Yes (comment)     Comments  used wheelchair     Resting HR  65 bpm     Resting BP  114/64     Resting Oxygen Saturation   92 %     Exercise Oxygen Saturation  during 6 min walk  91 %     Max Ex. HR  96 bpm     Max Ex. BP  138/64       Interval HR   1 Minute HR  87     2 Minute HR  91     3 Minute HR  91     4 Minute HR  95     5 Minute HR  96     6 Minute HR  92     Interval Heart Rate?  Yes       Interval Oxygen   Interval Oxygen?  Yes     Baseline Oxygen Saturation %  92 %     1 Minute Oxygen Saturation %  91 %     1 Minute Liters of Oxygen  2 L     2 Minute Oxygen  Saturation %  93 %     2 Minute Liters of Oxygen  3 L     3 Minute Oxygen Saturation %  93 %     3 Minute Liters of Oxygen  3 L     4 Minute Oxygen Saturation %  93 %     4 Minute Liters of Oxygen  3 L     5 Minute Oxygen Saturation %  95 %     5 Minute Liters of Oxygen  3 L     6 Minute Oxygen Saturation %  95 %     6 Minute Liters of Oxygen  3 L        Psychological, QOL, Others - Outcomes: PHQ 2/9: Depression screen [facility] 2/9 03/04/2018 12/27/2017  Decreased Interest 0 0  Down, Depressed, Hopeless 0 0  PHQ - 2 Score 0 0  Some encounter information is confidential and restricted. Go to Review Flowsheets activity to see all data.  Some recent data might be hidden    Quality of Life:   Personal Goals: Goals established at orientation with interventions provided to work toward goal.  Personal Goals and Risk Factors at Admission - 12/27/17 1424      Core Components/Risk Factors/Patient Goals on Admission   Tobacco Cessation  Yes   daughter says he cannot get to cigarettes, has only had 1 pack in a year   Intervention  Assist the participant in steps to quit. Provide individualized education and counseling about committing to Tobacco Cessation, relapse prevention, and pharmacological support that can be provided by physician.;Advice worker, assist with locating and accessing local/national Quit Smoking programs, and support quit date choice.    Expected Outcomes  Short Term: Will demonstrate readiness to quit, by selecting a quit date.    Improve shortness of breath with ADL's  Yes    Intervention  Provide education, individualized exercise plan and daily activity instruction to help decrease symptoms of SOB with activities of daily living.    Expected Outcomes  Short Term: Improve cardiorespiratory fitness to achieve a reduction of symptoms when performing ADLs;Long Term: Be able to perform more ADLs without symptoms or delay the onset of symptoms    Diabetes  Yes     Intervention  Provide education about signs/symptoms and action to take for hypo/hyperglycemia.;Provide education about proper nutrition, including hydration, and aerobic/resistive exercise prescription along with prescribed medications to achieve blood glucose in normal ranges: Fasting glucose 65-99 mg/dL    Expected Outcomes  Short Term: Participant verbalizes understanding of the signs/symptoms and immediate care of hyper/hypoglycemia, proper foot care and importance of medication, aerobic/resistive exercise and nutrition plan for blood glucose control.;Long Term: Attainment of HbA1C < 7%.        Personal Goals Discharge: Goals and Risk Factor Review    Row Name 12/27/17 1427 01/26/18 1240 02/24/18 1655 03/24/18 1021 06/05/18 2050     Core Components/Risk Factors/Patient Goals Review   Personal Goals Review  Improve shortness of breath with ADL's;Tobacco Cessation;Diabetes  Improve shortness of breath with ADL's;Tobacco Cessation;Develop more efficient breathing techniques such as purse lipped breathing and diaphragmatic breathing and practicing self-pacing with activity.;Diabetes;Increase knowledge of respiratory medications and ability to use respiratory devices properly.  Improve shortness of breath with ADL's;Tobacco Cessation;Develop more efficient breathing techniques such as purse lipped breathing and diaphragmatic breathing and practicing self-pacing with activity.;Diabetes;Increase knowledge of respiratory medications and ability to use respiratory devices properly.  Improve shortness of breath with ADL's;Tobacco Cessation;Develop more efficient breathing techniques such as purse lipped breathing and diaphragmatic breathing and practicing self-pacing with activity.;Diabetes;Increase knowledge of respiratory medications and ability to use respiratory devices properly.  Improve shortness of breath with ADL's;Tobacco Cessation;Develop more efficient breathing techniques such as purse lipped  breathing and diaphragmatic breathing and practicing self-pacing with activity.;Diabetes;Increase knowledge of respiratory medications and ability to use respiratory devices properly.   Review  The most interested in improving deconditioning and increase strength  Pt with delayed start of exercise.  Pt has completed 2 exercise session from 01/20/18.  Pt reports no change in his tobacco usage. Pt reports minimal shortness of breath on exertion.  Pt is encouraged to maintain his pace of exercise as he often will unknowingly slow down but with reminders he will resume at a higher pace  Pt has completed 6 exerercise session.  Pt reports no change in tobacco usage.  Pt denies any shortness of breath and generally likes to come to exercise.  Pt interacts with staff and is showing improvement of diabetes managment.  Appointment made for pt to follow up with PCP and changes were made with his medications.   HOpefully  pt will continue to gain progress during the next 30 day assessment.  Pt has completed 6 exerercise session.  Last exercise on 9/17.  Remarked to MD during office vistit, that we worked him too hard.  Pt average met 1.5. Pt reports no change in tobacco usage.  Pt denies any shortness of breath and generally likes to come to exercise.  Pt interacts with staff and is showing improvement of diabetes managment.  Willl contact pt to let him know due to his non attendance, planning to discharge pt from pulmonary rehab.  pt discharged with the completion of 6 exercsie sessions.  Pt was not appropriate physically or cognitively to participate in group exercise. pt did not want to participate anymore despite his daughter urging.   Expected Outcomes  Improved strength and conditioning  See Admission Goals/Outcomes  See Admission Goals/Outcomes  See Admission Goals/Outcomes  Pt did not complete any post assessments      Exercise Goals and Review:   Exercise Goals Re-Evaluation: Exercise Goals Re-Evaluation     Row Name 01/25/18 0723 02/22/18 0728 03/24/18 0740         Exercise Goal Re-Evaluation   Exercise Goals Review  Increase Physical Activity;Increase Strength and Stamina;Able to understand and use rate of perceived exertion (RPE) scale;Able to understand and use Dyspnea scale;Knowledge and understanding of Target Heart Rate Range (THRR);Understanding of Exercise Prescription  Increase Physical Activity;Increase Strength and Stamina;Able to understand and use rate of perceived exertion (RPE) scale;Able to understand and use Dyspnea scale;Knowledge and understanding of Target Heart Rate Range (THRR);Understanding of Exercise Prescription  Increase Physical Activity;Increase Strength and Stamina;Able to understand and use rate of perceived exertion (RPE) scale;Able to understand and use Dyspnea scale;Knowledge and understanding of Target Heart Rate Range (THRR);Understanding of Exercise Prescription     Comments  Patient has only attended 1 rehab session. Will cont. to monitor and progress as able.   Patient has only attended 4 rehab sessions. Biggest barrier right now is not his physical ability but managing his CBG. Patient has called out on a number of occasions becuase his sugar was too high for exercise. Will cont to educate family.  Patient has not been to rehab since 03/24/18. Not able to get in touch with patient or daughter. Will try again today--if unsuccessful will drop patient.     Expected Outcomes  Through rehab and exercising at home, patient will find it easier to carry out ADL's and will gain the confidence to establish an exercise routine at home.   Through rehab and exercising at home, patient will find it easier to carry out ADL's and will gain the confidence to establish an exercise routine at home.   Through rehab and exercising at home, patient will find it easier to carry out ADL's and will gain the confidence to establish an exercise routine at home.         Nutrition & Weight -  Outcomes: Pre Biometrics - 12/27/17 1424      Pre Biometrics   Grip Strength  28 kg        Nutrition: Nutrition Therapy & Goals - 05/06/18 1740      Nutrition Therapy   Diet  Diabetic, Heart Healthy      Personal Nutrition Goals   Nutrition Goal  Pt to identify and limit food sources of saturated fat, trans fat, and sodium      Intervention Plan   Intervention  Prescribe, educate and counsel regarding individualized specific dietary modifications aiming towards  targeted core components such as weight, hypertension, lipid management, diabetes, heart failure and other comorbidities.    Expected Outcomes  Short Term Goal: Understand basic principles of dietary content, such as calories, fat, sodium, cholesterol and nutrients.;Long Term Goal: Adherence to prescribed nutrition plan.       Nutrition Discharge: Nutrition Assessments - 05/06/18 1741      Rate Your Plate Scores   Pre Score  72    Post Score  --   unable to assess as pt did not return survey      Education Questionnaire Score: Knowledge Questionnaire Score - 12/27/17 1413      Knowledge Questionnaire Score   Pre Score  12/18       Pt did not complete any post assessments. Cherre Huger, BSN Cardiac and Training and development officer

## 2018-06-07 DIAGNOSIS — I259 Chronic ischemic heart disease, unspecified: Secondary | ICD-10-CM | POA: Diagnosis not present

## 2018-06-07 DIAGNOSIS — I5043 Acute on chronic combined systolic (congestive) and diastolic (congestive) heart failure: Secondary | ICD-10-CM | POA: Diagnosis not present

## 2018-06-08 DIAGNOSIS — N183 Chronic kidney disease, stage 3 unspecified: Secondary | ICD-10-CM

## 2018-06-08 HISTORY — DX: Chronic kidney disease, stage 3 unspecified: N18.30

## 2018-06-10 ENCOUNTER — Encounter (HOSPITAL_COMMUNITY): Payer: Self-pay | Admitting: Cardiology

## 2018-06-10 ENCOUNTER — Telehealth (HOSPITAL_COMMUNITY): Payer: Self-pay

## 2018-06-10 ENCOUNTER — Ambulatory Visit (HOSPITAL_COMMUNITY)
Admission: RE | Admit: 2018-06-10 | Discharge: 2018-06-10 | Disposition: A | Discharge status: Home / Self Care | Payer: Medicare Other | Source: Ambulatory Visit | Attending: Cardiology | Admitting: Cardiology

## 2018-06-10 VITALS — BP 108/62 | HR 82 | Wt 158.8 lb

## 2018-06-10 DIAGNOSIS — K7290 Hepatic failure, unspecified without coma: Secondary | ICD-10-CM | POA: Insufficient documentation

## 2018-06-10 DIAGNOSIS — G4733 Obstructive sleep apnea (adult) (pediatric): Secondary | ICD-10-CM | POA: Diagnosis not present

## 2018-06-10 DIAGNOSIS — Z86718 Personal history of other venous thrombosis and embolism: Secondary | ICD-10-CM | POA: Insufficient documentation

## 2018-06-10 DIAGNOSIS — Z8673 Personal history of transient ischemic attack (TIA), and cerebral infarction without residual deficits: Secondary | ICD-10-CM | POA: Diagnosis not present

## 2018-06-10 DIAGNOSIS — Z79899 Other long term (current) drug therapy: Secondary | ICD-10-CM | POA: Diagnosis not present

## 2018-06-10 DIAGNOSIS — K7460 Unspecified cirrhosis of liver: Secondary | ICD-10-CM | POA: Insufficient documentation

## 2018-06-10 DIAGNOSIS — F319 Bipolar disorder, unspecified: Secondary | ICD-10-CM | POA: Insufficient documentation

## 2018-06-10 DIAGNOSIS — Z8249 Family history of ischemic heart disease and other diseases of the circulatory system: Secondary | ICD-10-CM | POA: Insufficient documentation

## 2018-06-10 DIAGNOSIS — I2510 Atherosclerotic heart disease of native coronary artery without angina pectoris: Secondary | ICD-10-CM | POA: Diagnosis not present

## 2018-06-10 DIAGNOSIS — I5022 Chronic systolic (congestive) heart failure: Secondary | ICD-10-CM | POA: Diagnosis not present

## 2018-06-10 DIAGNOSIS — Z952 Presence of prosthetic heart valve: Secondary | ICD-10-CM | POA: Insufficient documentation

## 2018-06-10 DIAGNOSIS — Z7901 Long term (current) use of anticoagulants: Secondary | ICD-10-CM | POA: Insufficient documentation

## 2018-06-10 DIAGNOSIS — E785 Hyperlipidemia, unspecified: Secondary | ICD-10-CM | POA: Diagnosis not present

## 2018-06-10 DIAGNOSIS — Z87891 Personal history of nicotine dependence: Secondary | ICD-10-CM | POA: Diagnosis not present

## 2018-06-10 DIAGNOSIS — Z955 Presence of coronary angioplasty implant and graft: Secondary | ICD-10-CM | POA: Diagnosis not present

## 2018-06-10 DIAGNOSIS — Z9981 Dependence on supplemental oxygen: Secondary | ICD-10-CM | POA: Insufficient documentation

## 2018-06-10 DIAGNOSIS — F0390 Unspecified dementia without behavioral disturbance: Secondary | ICD-10-CM | POA: Diagnosis not present

## 2018-06-10 DIAGNOSIS — I482 Chronic atrial fibrillation, unspecified: Secondary | ICD-10-CM | POA: Diagnosis not present

## 2018-06-10 DIAGNOSIS — I255 Ischemic cardiomyopathy: Secondary | ICD-10-CM | POA: Diagnosis not present

## 2018-06-10 DIAGNOSIS — I110 Hypertensive heart disease with heart failure: Secondary | ICD-10-CM | POA: Diagnosis not present

## 2018-06-10 DIAGNOSIS — J449 Chronic obstructive pulmonary disease, unspecified: Secondary | ICD-10-CM | POA: Diagnosis not present

## 2018-06-10 DIAGNOSIS — Z7984 Long term (current) use of oral hypoglycemic drugs: Secondary | ICD-10-CM | POA: Insufficient documentation

## 2018-06-10 LAB — CBC
HCT: 40.2 % (ref 39.0–52.0)
Hemoglobin: 12.4 g/dL — ABNORMAL LOW (ref 13.0–17.0)
MCH: 28.6 pg (ref 26.0–34.0)
MCHC: 30.8 g/dL (ref 30.0–36.0)
MCV: 92.8 fL (ref 80.0–100.0)
NRBC: 0 % (ref 0.0–0.2)
PLATELETS: 218 10*3/uL (ref 150–400)
RBC: 4.33 MIL/uL (ref 4.22–5.81)
RDW: 14.6 % (ref 11.5–15.5)
WBC: 13.7 10*3/uL — AB (ref 4.0–10.5)

## 2018-06-10 LAB — BASIC METABOLIC PANEL
ANION GAP: 14 (ref 5–15)
BUN: 33 mg/dL — ABNORMAL HIGH (ref 8–23)
CALCIUM: 9 mg/dL (ref 8.9–10.3)
CO2: 28 mmol/L (ref 22–32)
Chloride: 95 mmol/L — ABNORMAL LOW (ref 98–111)
Creatinine, Ser: 1.7 mg/dL — ABNORMAL HIGH (ref 0.61–1.24)
GFR calc non Af Amer: 40 mL/min — ABNORMAL LOW (ref >60)
GFR, EST AFRICAN AMERICAN: 46 mL/min — AB (ref >60)
Glucose, Bld: 208 mg/dL — ABNORMAL HIGH (ref 70–99)
Potassium: 5 mmol/L (ref 3.5–5.1)
Sodium: 137 mmol/L (ref 135–145)

## 2018-06-10 LAB — DIGOXIN LEVEL: Digoxin Level: 1 ng/mL (ref 0.8–2.0)

## 2018-06-10 MED ORDER — CARVEDILOL 12.5 MG PO TABS: 6.2500 mg | ORAL_TABLET | Freq: Two times a day (BID) | ORAL | 3 refills | Status: DC

## 2018-06-10 MED ORDER — CARVEDILOL 12.5 MG PO TABS: 12.5000 mg | ORAL_TABLET | Freq: Two times a day (BID) | ORAL | 3 refills | Status: DC

## 2018-06-10 NOTE — Telephone Encounter (Signed)
Pt called spoke with his daughter verbalized medication change appointment made for lab work

## 2018-06-10 NOTE — Patient Instructions (Signed)
Increase Carvedilol to 9.375mg  twice daily for 4 days. Then increase Carvedilol to 12.5mg  twice daily.  Routine lab work today. Will notify you of abnormal results  Follow up in 3 months with Dr.McLean
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
## 2018-06-13 ENCOUNTER — Other Ambulatory Visit (HOSPITAL_COMMUNITY): Payer: Self-pay

## 2018-06-13 ENCOUNTER — Other Ambulatory Visit: Payer: Self-pay | Admitting: Internal Medicine

## 2018-06-13 MED ORDER — ATORVASTATIN CALCIUM 40 MG PO TABS: 40.0000 mg | ORAL_TABLET | Freq: Every day | ORAL | 1 refills | Status: DC

## 2018-06-17 DIAGNOSIS — I259 Chronic ischemic heart disease, unspecified: Secondary | ICD-10-CM | POA: Diagnosis not present

## 2018-06-17 DIAGNOSIS — J449 Chronic obstructive pulmonary disease, unspecified: Secondary | ICD-10-CM | POA: Diagnosis not present

## 2018-06-21 DIAGNOSIS — J449 Chronic obstructive pulmonary disease, unspecified: Secondary | ICD-10-CM | POA: Diagnosis not present

## 2018-06-21 DIAGNOSIS — I259 Chronic ischemic heart disease, unspecified: Secondary | ICD-10-CM | POA: Diagnosis not present

## 2018-06-24 ENCOUNTER — Other Ambulatory Visit (HOSPITAL_COMMUNITY): Payer: Self-pay

## 2018-06-26 IMAGING — US US ABDOMEN LIMITED
1 series · 14 of 25 positions shown · non-contrast
Comparison: 08/23/2017 CT

CLINICAL DATA: Cirrhosis

EXAM:
ULTRASOUND ABDOMEN LIMITED RIGHT UPPER QUADRANT

[Series 1: us abdomen limited · 0.20mm/px · 14 of 34 slices shown]
[im 1/34]
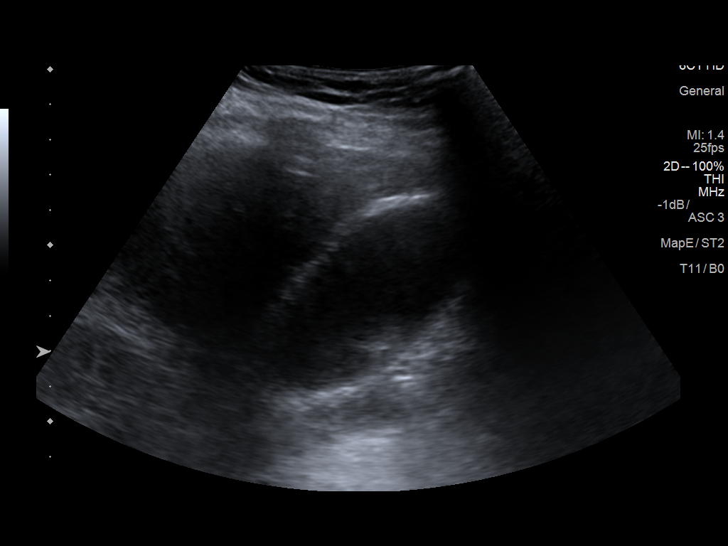
[im 3/34]
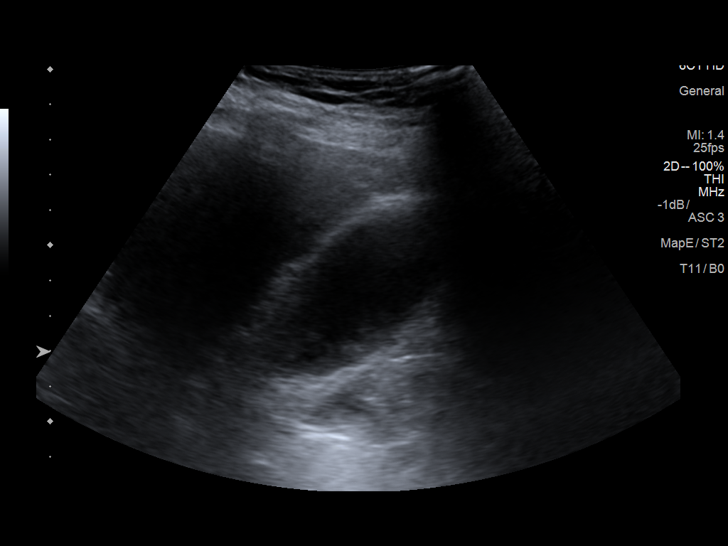
[im 6/34]
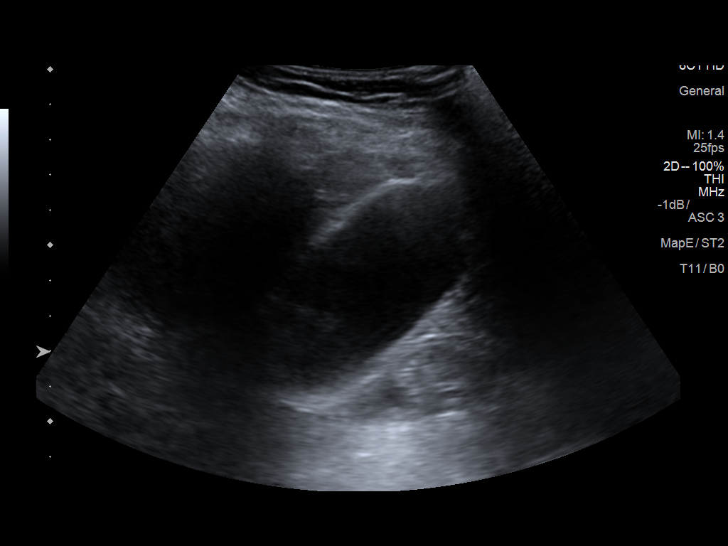
[im 9/34]
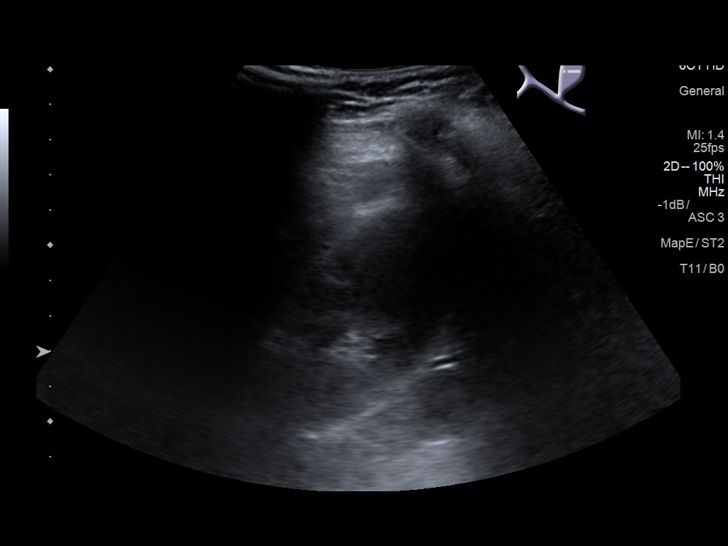
[im 12/34]
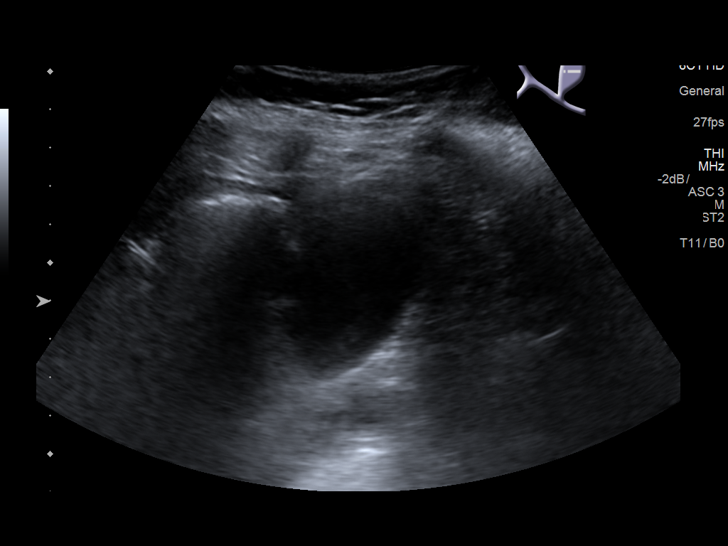
[im 13/34]
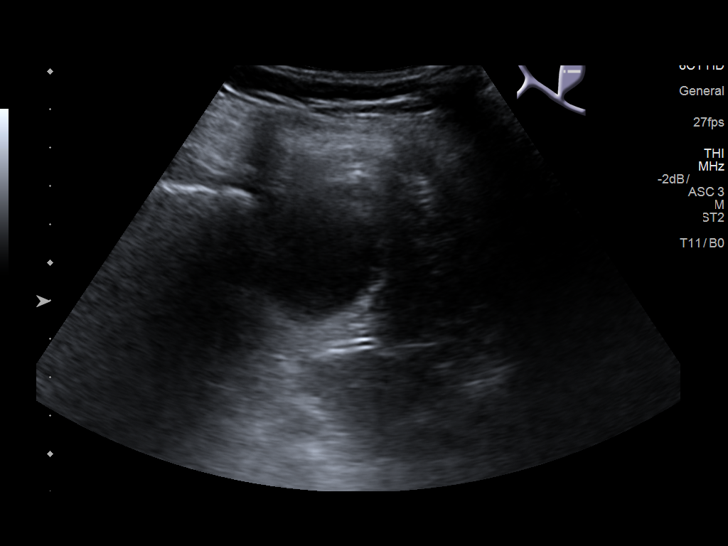
[im 16/34]
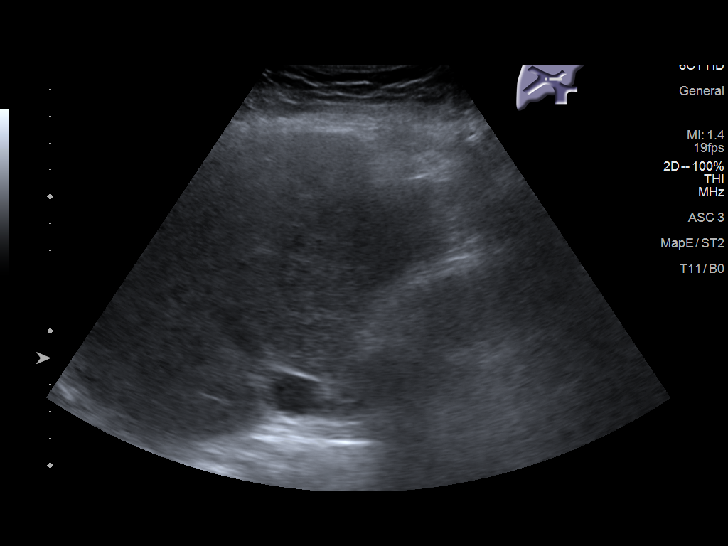
[im 18/34]
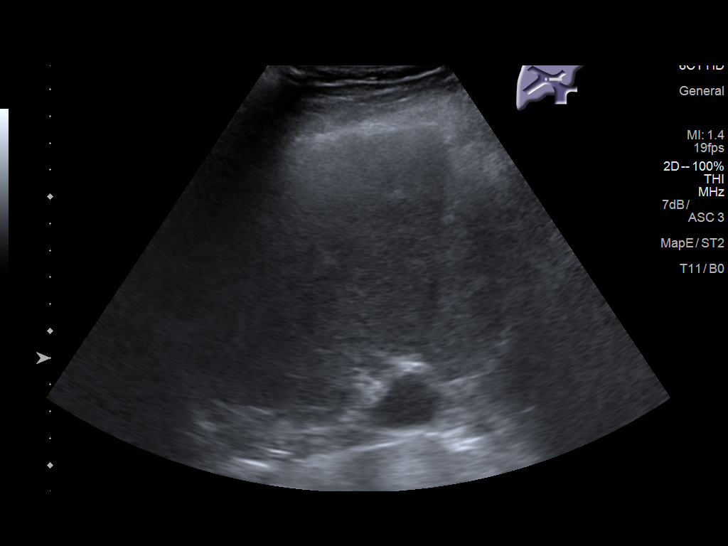
[im 21/34]
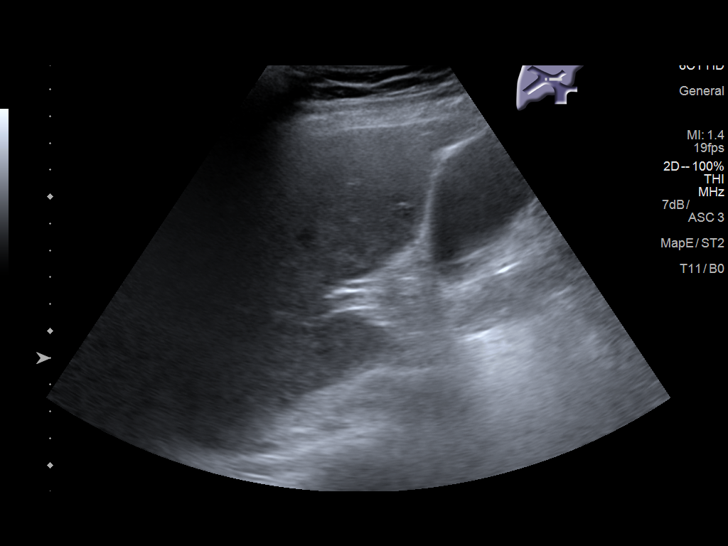
[im 23/34]
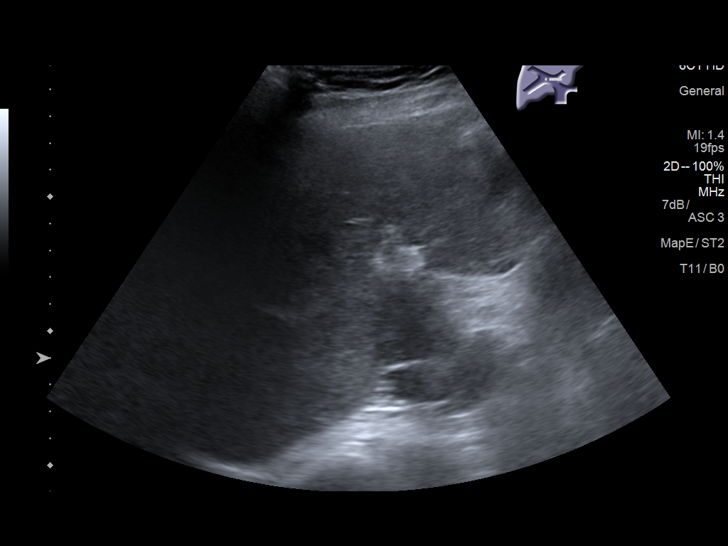
[im 25/34]
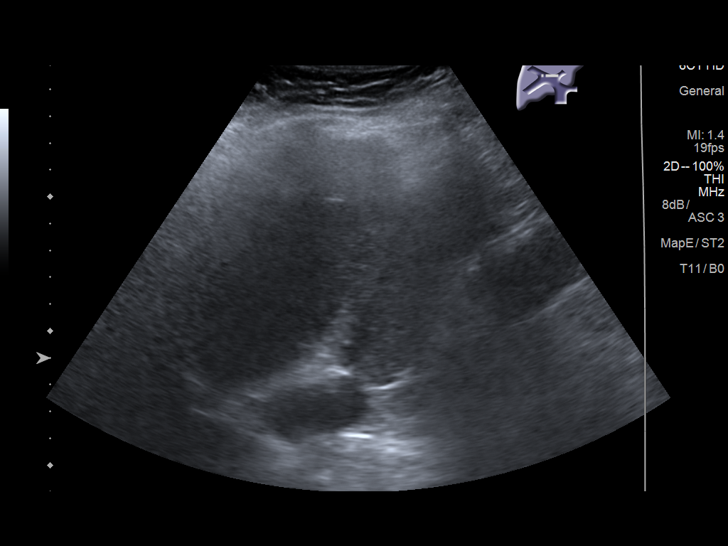
[im 28/34]
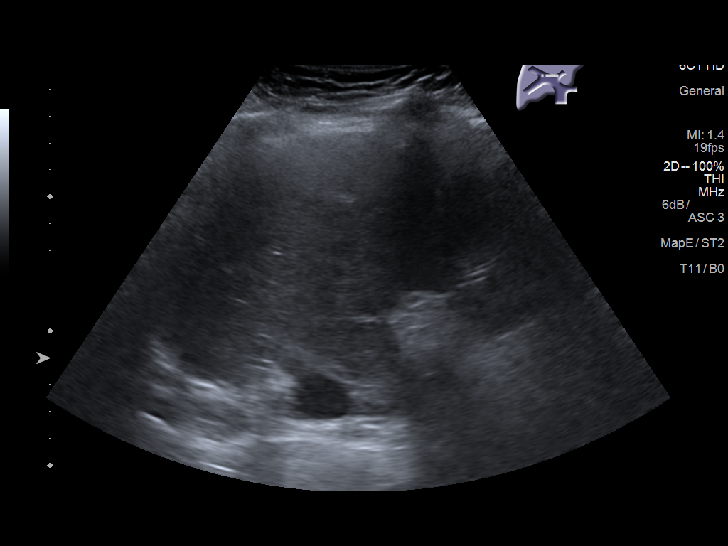
[im 31/34]
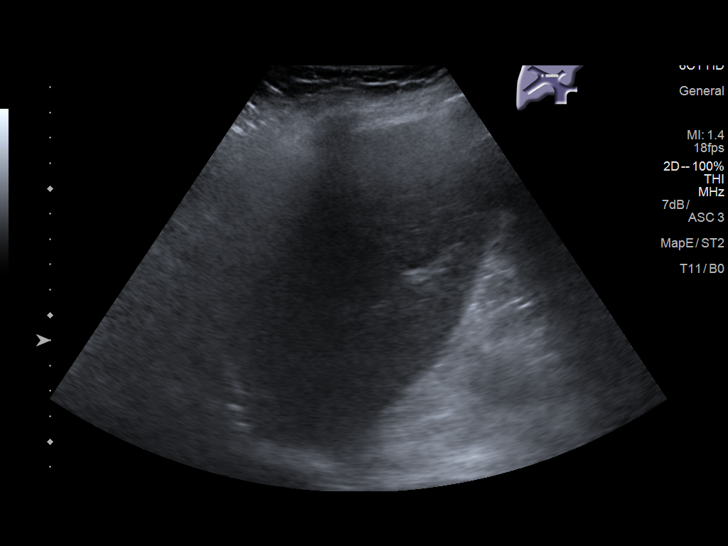
[im 34/34]
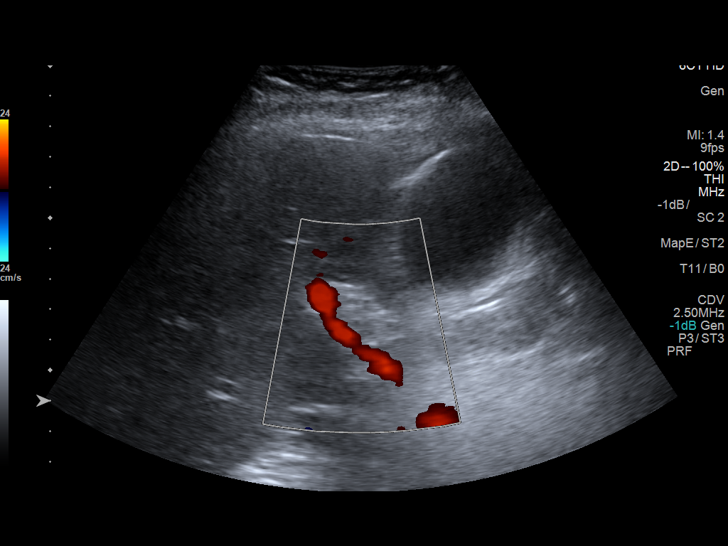

[14 of 25 positions shown; findings below may reference images not displayed]

FINDINGS: Gallbladder:

Slightly dilated gallbladder with small amount of sludge and stones.
Negative sonographic Murphy. Upper normal wall thickness at 3.2 mm

Common bile duct:

Diameter: 4.2 mm

Liver:

Increased echogenicity. No focal hepatic abnormality. Limited
visualization due to positioning. Portal vein is patent on color
Doppler imaging with normal direction of blood flow towards the
liver.
IMPRESSION: 1. Slightly dilated gallbladder which contains small amount of
stones and sludge. Wall thickness upper normal. No definitive
sonographic evidence for cholecystitis or biliary dilatation
2. Increased hepatic echogenicity which may be secondary to
steatosis or hepatocellular disease. No focal hepatic abnormality.

## 2018-06-28 ENCOUNTER — Ambulatory Visit (INDEPENDENT_AMBULATORY_CARE_PROVIDER_SITE_OTHER): Payer: Medicare Other | Admitting: Internal Medicine

## 2018-06-28 ENCOUNTER — Encounter: Payer: Self-pay | Admitting: Internal Medicine

## 2018-06-28 VITALS — BP 90/60 | HR 77 | Ht 65.0 in | Wt 160.0 lb

## 2018-06-28 DIAGNOSIS — F17210 Nicotine dependence, cigarettes, uncomplicated: Secondary | ICD-10-CM | POA: Diagnosis not present

## 2018-06-28 DIAGNOSIS — J9611 Chronic respiratory failure with hypoxia: Secondary | ICD-10-CM | POA: Diagnosis not present

## 2018-06-28 DIAGNOSIS — J9612 Chronic respiratory failure with hypercapnia: Secondary | ICD-10-CM

## 2018-06-28 DIAGNOSIS — R21 Rash and other nonspecific skin eruption: Secondary | ICD-10-CM | POA: Diagnosis not present

## 2018-06-28 DIAGNOSIS — J449 Chronic obstructive pulmonary disease, unspecified: Secondary | ICD-10-CM
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
## 2018-06-28 NOTE — Patient Instructions (Addendum)
The key is to stop smoking completely before smoking completely stops you!  For smoking cessation classes call [phone]     No change in medications   Please schedule a follow up visit in 3 months but call sooner if needed  Late add:  Increase 02 to 4lpm when walking more than across the room

## 2018-06-28 NOTE — Assessment & Plan Note (Signed)
HC03  06/13/17 = 39  - PSS 12/03/17 c/w no osa/ noct desats corrected on 2lpm s bipap - HC03 12/13/2017  = 40  - HC02  12/20/17   = 34  - HCO3 03/25/18 = 31  - 03/28/2018  Walked RA x 3 laps @ 185 ft each stopped due to  End of study, slow pace, no sob or desat  < 90% so rec 02 prn daytime  - 06/28/2018   Walked RA x 14  lap =  approx 128ft  - stopped due to sob and desat lower 80's titrate up to 4lpm and able to complete the lap    rec as of 06/28/2018 = 2lpm 24/7  X 4lpm when walking more than across the room

## 2018-06-28 NOTE — Assessment & Plan Note (Signed)
Active smoker Spirometry 10/11/15  Restrictive only but did not breath out the full 6 secs and does have mild curvature on F/V - bipap hs since d/c 11/03/17 for hypercabia (see separate a/p)  - PFT's  12/23/2017  FEV1 1.00 (40 % ) ratio 81  p no % improvement from saba p brovana  prior to study with DLCO  30 % corrects to 92  % for alv volume  / only mild curvature - Rehab to start late July 2019   - went x 1.5 months    Appears to be doing a little better on laba/ics neb typical of pt with copd/ab despite active smoking (see separate a/p)   No change in rx needed at this point

## 2018-06-28 NOTE — Assessment & Plan Note (Signed)
Counseled re importance of smoking cessation but did not meet time criteria for separate billing    I had an extended discussion with the patient  And daughter  reviewing all relevant studies completed to date and  lasting 15 to 20 minutes of a 25 minute visit  which included directly observing ambulatory 02 saturation study documented in a/p section of  today's  office note.  Each maintenance medication was reviewed in detail including most importantly the difference between maintenance and prns and under what circumstances the prns are to be triggered using an action plan format that is not reflected in the computer generated alphabetically organized AVS.     Please see AVS for specific instructions unique to this visit that I personally wrote and verbalized to the the pt in detail and then reviewed with pt  by my nurse highlighting any changes in therapy recommended at today's visit .

## 2018-07-03 LAB — CUP PACEART REMOTE DEVICE CHECK
Battery Remaining Longevity: 21 mo
Battery Voltage: 2.93 V
Brady Statistic AP VP Percent: 0 %
Brady Statistic AP VS Percent: 0 %
Brady Statistic AS VP Percent: 94.98 %
Brady Statistic AS VS Percent: 5.02 %
Brady Statistic RA Percent Paced: 0 %
Brady Statistic RV Percent Paced: 95.9 %
Date Time Interrogation Session: 20191216083325
HighPow Impedance: 44 Ohm
HighPow Impedance: 56 Ohm
Implantable Lead Implant Date: 20080110
Implantable Lead Implant Date: 20090617
Implantable Lead Location: 753858
Implantable Lead Location: 753859
Implantable Lead Model: 5076
Implantable Lead Model: 6947
Implantable Pulse Generator Implant Date: 20150827
Lead Channel Impedance Value: 399 Ohm
Lead Channel Impedance Value: 4047 Ohm
Lead Channel Impedance Value: 4047 Ohm
Lead Channel Impedance Value: 532 Ohm
Lead Channel Impedance Value: 532 Ohm
Lead Channel Impedance Value: 627 Ohm
Lead Channel Pacing Threshold Amplitude: 0.625 V
Lead Channel Pacing Threshold Amplitude: 0.625 V
Lead Channel Pacing Threshold Amplitude: 1.375 V
Lead Channel Pacing Threshold Pulse Width: 0.4 ms
Lead Channel Pacing Threshold Pulse Width: 0.4 ms
Lead Channel Pacing Threshold Pulse Width: 0.4 ms
Lead Channel Sensing Intrinsic Amplitude: 0.875 mV
Lead Channel Sensing Intrinsic Amplitude: 24.5 mV
Lead Channel Sensing Intrinsic Amplitude: 24.5 mV
Lead Channel Setting Pacing Amplitude: 2.5 V
Lead Channel Setting Pacing Amplitude: 2.5 V
Lead Channel Setting Pacing Pulse Width: 0.4 ms
Lead Channel Setting Pacing Pulse Width: 0.4 ms
Lead Channel Setting Sensing Sensitivity: 0.45 mV
MDC IDC LEAD IMPLANT DT: 20060307
MDC IDC LEAD LOCATION: 753860
MDC IDC MSMT LEADCHNL RA SENSING INTR AMPL: 0.875 mV

## 2018-07-04 ENCOUNTER — Other Ambulatory Visit (HOSPITAL_COMMUNITY): Payer: Self-pay

## 2018-07-06 DIAGNOSIS — I259 Chronic ischemic heart disease, unspecified: Secondary | ICD-10-CM | POA: Diagnosis not present

## 2018-07-06 DIAGNOSIS — J449 Chronic obstructive pulmonary disease, unspecified: Secondary | ICD-10-CM | POA: Diagnosis not present

## 2018-07-07 ENCOUNTER — Ambulatory Visit (HOSPITAL_COMMUNITY)
Admission: RE | Admit: 2018-07-07 | Discharge: 2018-07-07 | Disposition: A | Discharge status: Home / Self Care | Payer: Medicare Other | Source: Ambulatory Visit | Attending: Cardiology | Admitting: Cardiology

## 2018-07-07 DIAGNOSIS — I5022 Chronic systolic (congestive) heart failure: Secondary | ICD-10-CM | POA: Insufficient documentation

## 2018-07-07 LAB — BASIC METABOLIC PANEL
Anion gap: 10 (ref 5–15)
BUN: 26 mg/dL — ABNORMAL HIGH (ref 8–23)
CO2: 28 mmol/L (ref 22–32)
Calcium: 10.2 mg/dL (ref 8.9–10.3)
Chloride: 98 mmol/L (ref 98–111)
Creatinine, Ser: 1.51 mg/dL — ABNORMAL HIGH (ref 0.61–1.24)
GFR calc Af Amer: 53 mL/min — ABNORMAL LOW (ref >60)
GFR, EST NON AFRICAN AMERICAN: 46 mL/min — AB (ref >60)
Glucose, Bld: 328 mg/dL — ABNORMAL HIGH (ref 70–99)
Potassium: 4.7 mmol/L (ref 3.5–5.1)
Sodium: 136 mmol/L (ref 135–145)

## 2018-07-08 DIAGNOSIS — I5043 Acute on chronic combined systolic (congestive) and diastolic (congestive) heart failure: Secondary | ICD-10-CM | POA: Diagnosis not present

## 2018-07-08 DIAGNOSIS — I259 Chronic ischemic heart disease, unspecified: Secondary | ICD-10-CM | POA: Diagnosis not present

## 2018-07-11 DIAGNOSIS — J449 Chronic obstructive pulmonary disease, unspecified: Secondary | ICD-10-CM | POA: Diagnosis not present

## 2018-07-11 DIAGNOSIS — I259 Chronic ischemic heart disease, unspecified: Secondary | ICD-10-CM | POA: Diagnosis not present

## 2018-07-18 DIAGNOSIS — J449 Chronic obstructive pulmonary disease, unspecified: Secondary | ICD-10-CM | POA: Diagnosis not present

## 2018-07-18 DIAGNOSIS — I259 Chronic ischemic heart disease, unspecified: Secondary | ICD-10-CM | POA: Diagnosis not present

## 2018-07-19 ENCOUNTER — Ambulatory Visit (INDEPENDENT_AMBULATORY_CARE_PROVIDER_SITE_OTHER): Payer: Medicare Other | Admitting: Family Medicine

## 2018-07-19 ENCOUNTER — Encounter: Payer: Self-pay | Admitting: Family Medicine

## 2018-07-19 ENCOUNTER — Telehealth: Payer: Self-pay | Admitting: *Deleted

## 2018-07-19 VITALS — BP 100/57 | HR 59 | Temp 97.7°F | Resp 16 | Ht 65.0 in | Wt 159.0 lb

## 2018-07-19 DIAGNOSIS — I482 Chronic atrial fibrillation, unspecified: Secondary | ICD-10-CM

## 2018-07-19 DIAGNOSIS — J9612 Chronic respiratory failure with hypercapnia: Secondary | ICD-10-CM

## 2018-07-19 DIAGNOSIS — J9611 Chronic respiratory failure with hypoxia: Secondary | ICD-10-CM

## 2018-07-19 DIAGNOSIS — E119 Type 2 diabetes mellitus without complications: Secondary | ICD-10-CM

## 2018-07-19 DIAGNOSIS — I255 Ischemic cardiomyopathy: Secondary | ICD-10-CM | POA: Diagnosis not present

## 2018-07-19 DIAGNOSIS — N183 Chronic kidney disease, stage 3 unspecified: Secondary | ICD-10-CM

## 2018-07-19 DIAGNOSIS — J449 Chronic obstructive pulmonary disease, unspecified: Secondary | ICD-10-CM

## 2018-07-19 DIAGNOSIS — Z954 Presence of other heart-valve replacement: Secondary | ICD-10-CM | POA: Diagnosis not present

## 2018-07-19 DIAGNOSIS — R21 Rash and other nonspecific skin eruption: Secondary | ICD-10-CM

## 2018-07-19 DIAGNOSIS — F17200 Nicotine dependence, unspecified, uncomplicated: Secondary | ICD-10-CM

## 2018-07-19 LAB — POCT GLYCOSYLATED HEMOGLOBIN (HGB A1C): Hemoglobin A1C: 9.2 % — AB (ref 4.0–5.6)

## 2018-07-19 MED ORDER — FLUTICASONE PROPIONATE 0.05 % EX CREA: TOPICAL_CREAM | Freq: Two times a day (BID) | CUTANEOUS | 1 refills | Status: DC

## 2018-07-19 MED ORDER — BLOOD GLUCOSE METER KIT: PACK | 0 refills | Status: DC

## 2018-07-19 MED ORDER — EMPAGLIFLOZIN 25 MG PO TABS: 25.0000 mg | ORAL_TABLET | Freq: Every day | ORAL | 0 refills | Status: DC

## 2018-07-19 NOTE — Addendum Note (Signed)
Addended by: Smitty Knudsen on: 07/19/2018 01:52 PM   Modules accepted: Orders

## 2018-07-19 NOTE — Telephone Encounter (Signed)
Received medical records from Dr. Patsi Sears at [facility].  I reviewed records and abstracted information into pts chart.   Records have been placed on Dr. Samul Dada desk for review.

## 2018-07-19 NOTE — Progress Notes (Signed)
Office Note 07/19/2018  CC:  Chief Complaint  Patient presents with  . Establish Care    Previous PCP: Dr. Patsi Sears at [facility]  . Diabetes  . Rash    has apt with Dermatology    HPI:  [name] is a 73 y.o. White male who is here accompanied by his daughter to establish care Patient's most recent primary MD: Dr. Leticia Clas at [facility].  Also, Dr. [name] and Dr. Shirlee Latch are his specialists. Old records in EPIC/HL EMR were reviewed prior to or during today's visit.  He has an extensive cardiac hx (see PMH section below), with COPD, OSA on CPAP, chronic hypoxic and hypercarbic resp failure, cirrhosis with hx of hepatic encephalopathy, hx of ETOH abuse.  He sees Dr. Shirlee Latch at [facility]F clinic, most recently 06/10/2018. At that time he was stable and his coreg was up titrated to 12.5mg  bid and continued on aldactone, losartan, and digoxin. He was continued on eliquis for his chronic a-fib, continued on his statin. Daughter says this increase in coreg never did happen--she doesn't act like she knows anything about the dose change. Taking torsemide 20mg  tabs, 3 per day.  Has instructions to give extra dose prn wt gain >2 lbs in a day or > 5lbs in 1 week.  He sees Dr. [name] for COPD/resp failure, most recently 06/28/2018: his oxygen was increased from 2.5L to 4 L/min when he ambulates, kept at 2L/min at rest.  No change in meds at that time. He does still smoke.  He is currently using 2L oxygen most of the time, occ has to increase flow rate. Takes oxygen off to go outside and smoke.  He quit pulm rehab after 4 visits b/c he complained.  Has DM 2, unsure of when diagnosed but sounds like within the last year or two(??): had been on glipizide alone and then a couple months ago jardiance was added by cardiologist. He has been sleepier than usual since then per daughter. No home glucose monitoring is being done lately. Daughter checked glucoses qd -bid and historically has gotten diff  measurements than specialists offices, etc, so she is interested in new glucometer if possible.  Daughter recollects a1c around 9 at a cardiology visit towards end of last year-->info unclear, lab not in EMR.   ROS: no CP, no SOB, no wheezing, no cough, no dizziness, no HAs, no rashes, no melena/hematochezia.  No polyuria or polydipsia.  No myalgias or arthralgias. +Dry, scattered patches of hyperkeratosis oval shaped, some coalesced and more flaky/greasy than other areas. Areas involved are backs of elbows, occipital scalp and upper cervical region extending out of hairline, bilat LL's.  Past Medical History:  Diagnosis Date  . AICD (automatic cardioverter/defibrillator) present   . Alcohol abuse   . Anxiety   . Bipolar disorder (HCC)   . Biventricular automatic implantable cardioverter defibrillator in situ    a. 01/2014 s/p MDT [id number] Viva XT CRT-D (ser # [id number]).  . CAD (coronary artery disease)    a. s/p prior PCI/stenting of the LCX;  b. 08/2014 MV: EF 20%, large septal, apical, and inferior infarct from apex to base, no ischemia;  c. 08/2014 NSTEMI/Cath: LM mod distal dzs extending into ostial LCX (80%), LAD tortuous, RI nl, OM mod dzs, RCA 100 CTO with R->R and L->R collats-->Med Rx.  . Chronic atrial fibrillation    a. CHA2DS2VASc = 6->eliquis;  b. S/P AVN RFCA an BiV ICD placement.  . Chronic respiratory failure (HCC)   . Chronic  systolic congestive heart failure (HCC)    a. 01/2014 Echo: EF 25-30%, mod conc LVH, mod dil LA.  . CKD (chronic kidney disease)    a. suspect stage II-III based on historical labs.  Stage III (GFR 40s)  . Complete heart block (HCC)    a. In setting of prior AV nodal ablation r/t afib-->BiV ICD (01/2014).  . COPD (chronic obstructive pulmonary disease) (HCC)   . CVA (cerebral vascular accident) (HCC)    a. Multiple prior embolic strokes.  . Dementia (HCC)   . Depression   . Diabetes mellitus (HCC)   . DVT (deep venous thrombosis) (HCC)   .  Dyslipidemia   . Hyperglycemia   . Hypertension   . Mitral valve disease    a. remote mitral replacement with Bjork Shiley valve;  b. 06/2006 Redo MVR with tissue valve.  . Mixed Ischemic and Nonischemic Cardiomyopathy    a. 8/.2015 Echo: Ef 25-30%;  b. 01/2014 s/p MDT [id number] Talbot Grumbling XT CRT-D (ser # [id number]).  . On home oxygen therapy    "2L; 24/7" (65/16/2019)  . Psoriasis    "back of his head; elbows; finger of left hand" (06/07/2017)  . Tobacco dependence    current as of 07/2018  . Ventral hernia     Past Surgical History:  Procedure Laterality Date  . AV NODE ABLATION  2007  . CARDIAC CATHETERIZATION  06/2006   Mild ostial L main stenosis, CFX stent patent, RCA occluded (old)  . CORONARY ARTERY BYPASS GRAFT  2008  . CYSTOSCOPY W/ URETERAL STENT PLACEMENT  07/12/2011   Procedure: CYSTOSCOPY WITH RETROGRADE PYELOGRAM/URETERAL STENT PLACEMENT;  Surgeon: Sebastian Ache, MD;  Location: [facility] OR;  Service: Urology;  Laterality: Left;  . HERNIA REPAIR    . ICD GENERATOR CHANGE  02/01/2014   Gen change to: Medtronic VIVA pulse generator, serial number [id number]  . INSERT / REPLACE / REMOVE PACEMAKER    . LEFT HEART CATHETERIZATION WITH CORONARY ANGIOGRAM N/A 08/23/2014   Procedure: LEFT HEART CATHETERIZATION WITH CORONARY ANGIOGRAM;  Surgeon: Corky Crafts, MD;  Location[facility] C[facility] LAB;  Service: Cardiovascular;  Laterality: N/A;  . MITRAL VALVE REPLACEMENT     remote [facility] valve [id number] with redo tissue valve 06/2006  . PACEMAKER GENERATOR CHANGE N/A 02/01/2014   Procedure: PACEMAKER GENERATOR CHANGE;  Surgeon: Duke Salvia, MD;  Location: [name] CATH LAB;  Service: Cardiovascular;  Laterality: N/A;  . PACEMAKER PLACEMENT  2006   Changed to CRT-D in 2009  . RIGHT/LEFT HEART CATH AND CORONARY ANGIOGRAPHY N/A 06/09/2017   Procedure: RIGHT/LEFT HEART CATH AND CORONARY ANGIOGRAPHY;  Surgeon: [name], MD;  Location: [facility] INVASIVE CV LAB;  Service: Cardiovascular;  Laterality: N/A;  .  TRANSTHORACIC ECHOCARDIOGRAM  10/2017   EF 30-35%, inf-lat and inf akin, MV bioprosth ok, mod dec RV fxn, pulm HTN (peak 65).    Family History  Problem Relation Age of Onset  . Alzheimer's disease Mother   . Heart attack Father   . Heart disease Father   . Heart attack Son   . Varicose Veins Son   . Deep vein thrombosis Son   . Heart disease Son   . Stroke Other   . Heart disease Other   . Heart disease Brother     Social History   Socioeconomic History  . Marital status: Divorced    Spouse name: Not on file  . Number of children: Not on file  . Years of education: Not on file  .  Highest education level: Not on file  Occupational History  . Occupation: Retired [employer]  Social Needs  . Financial resource strain: Not on file  . Food insecurity:    Worry: Not on file    Inability: Not on file  . Transportation needs:    Medical: Not on file    Non-medical: Not on file  Tobacco Use  . Smoking status: Current Some Day Smoker    Packs/day: 2.00    Years: 45.00    Pack years: 90.00    Types: Cigarettes  . Smokeless tobacco: Never Used  . Tobacco comment: daughter states he cannot get to cigarettes now few cigarettes a day  Substance and Sexual Activity  . Alcohol use: Not Currently    Alcohol/week: 21.0 standard drinks    Types: 21 Cans of beer per week    Comment: 10/21/2017 "~ 2-3 beers/day"  . Drug use: No  . Sexual activity: Not Currently  Lifestyle  . Physical activity:    Days per week: Not on file    Minutes per session: Not on file  . Stress: Not on file  Relationships  . Social connections:    Talks on phone: Not on file    Gets together: Not on file    Attends religious service: Not on file    Active member of club or organization: Not on file    Attends meetings of clubs or organizations: Not on file    Relationship status: Not on file  . Intimate partner violence:    Fear of current or ex partner: Not on file    Emotionally abused: Not on file     Physically abused: Not on file    Forced sexual activity: Not on file  Other Topics Concern  . Not on file  Social History Narrative   Pt is divorced.   Pt lives in 1 story home with his daughter and her family   Has 2 children.   10th grade education   Worked in [employer], retired.        Outpatient Encounter Medications as of 07/19/2018  Medication Sig  . arformoterol (BROVANA) 15 MCG/2ML NEBU Take 2 mLs (15 mcg total) by nebulization 2 (two) times daily for 15 days.  Marland Kitchen. atorvastatin (LIPITOR) 40 MG tablet Take 1 tablet (40 mg total) by mouth daily at 6 PM.  . budesonide (PULMICORT) 0.5 MG/2ML nebulizer solution Take 2 mLs (0.5 mg total) by nebulization 2 (two) times daily. DX: J44.9  . carvedilol (COREG) 6.25 MG tablet Take 1 tablet by mouth 2 (two) times daily.  . digoxin (LANOXIN) 0.125 MG tablet Take 1 tablet (0.125 mg total) by mouth daily.  Marland Kitchen. ELIQUIS 5 MG TABS tablet Take 1 tablet (5 mg total) by mouth 2 (two) times daily.  Marland Kitchen. glipiZIDE (GLUCOTROL XL) 2.5 MG 24 hr tablet Take 2 tab every morning and 1 tab every evening  . ipratropium-albuterol (DUONEB) 0.5-2.5 (3) MG/3ML SOLN Take 3 mLs by nebulization every 4 (four) hours as needed.  . lactulose (CHRONULAC) 10 GM/15ML solution TAKE 30 MILLILITERS BY MOUTH TWICE A DAY (Patient taking differently: TAKE 45 MILLILITERS BY MOUTH daily)  . loratadine (CLARITIN) 10 MG tablet Take 1 tablet (10 mg total) by mouth daily.  Marland Kitchen. losartan (COZAAR) 25 MG tablet Take 1 tablet by mouth daily.  . Multiple Vitamin (MULTIVITAMIN WITH MINERALS) TABS tablet Take 1 tablet by mouth daily.  . nitroGLYCERIN (NITROSTAT) 0.4 MG SL tablet Place 1 tablet (0.4 mg total) under  the tongue every 5 (five) minutes as needed for chest pain.  . OXYGEN Inhale 2.5 L into the lungs continuous.   Marland Kitchen thiamine 100 MG tablet Take 1 tablet (100 mg total) by mouth daily.  Marland Kitchen torsemide (DEMADEX) 20 MG tablet Take 4 tablets (80 mg total) by mouth daily.  .  [DISCONTINUED] empagliflozin (JARDIANCE) 10 MG TABS tablet Take 10 mg by mouth daily.  . empagliflozin (JARDIANCE) 25 MG TABS tablet Take 25 mg by mouth daily.  . fluticasone (CUTIVATE) 0.05 % cream Apply topically 2 (two) times daily.  Marland Kitchen spironolactone (ALDACTONE) 25 MG tablet Take 1 tablet (25 mg total) by mouth daily.  . [DISCONTINUED] augmented betamethasone dipropionate (DIPROLENE-AF) 0.05 % cream Apply 1 application topically 2 (two) times daily.  . [DISCONTINUED] carvedilol (COREG) 12.5 MG tablet Take 1 tablet (12.5 mg total) by mouth 2 (two) times daily with a meal.  . [DISCONTINUED] glipiZIDE (GLUCOTROL) 5 MG tablet Take 7.5 mg by mouth daily before breakfast.   . [DISCONTINUED] losartan (COZAAR) 50 MG tablet Take 1 tablet (50 mg total) by mouth daily.  . [DISCONTINUED] senna-docusate (SENOKOT-S) 8.6-50 MG tablet Take 1 tablet by mouth daily. Hold for diarrhea. (Patient not taking: Reported on 07/19/2018)   No facility-administered encounter medications on file as of 07/19/2018.     Allergies  Allergen Reactions  . Warfarin Other (See Comments)    Non compliance and ETOH abuse    ROS See HPI above.  PE; Blood pressure (!) 100/57, pulse (!) 59, temperature 97.7 F (36.5 C), temperature source Oral, resp. rate 16, height 5\' 5"  (1.651 m), weight 159 lb (72.1 kg), SpO2 97 %. Body mass index is 26.46 kg/m.  Gen: Alert, tired- appearing but NAD.  Patient is oriented to person, general place, and general situation. MSX:JDBZ: no injection, icteris, swelling, or exudate.  EOMI, PERRLA. Mouth: lips without lesion/swelling.  Oral mucosa pink and moist. Oropharynx without erythema, exudate, or swelling.  CV: RRR, no m/r/g.   LUNGS: CTA bilat, nonlabored resps, good aeration in all lung fields.   ABD: soft, NT, mild distention (this is his baseline per pt and daughter) but no flank dullness. BS normal.  No hepatospenomegaly or mass.  No bruits.  Mild ventral hernia from xyphoid to umbilical  region vs diastasis rectus. EXT: no cyanosis.  +clubbing and trace bilat LL pitting edema. SKIN: very greasy/flaky hyperkeratotic rash on occipital scalp/upper neck posteriorly, some erythema in this region. Backs of both elbows with more flesh colored to chalky hyperkeratotic patches, with similar rash on anterior aspects of both LL's.  Both calves have oval, deep pink splotches of hyperkeratotic skin w/out nearly as much flaking. LL's with dryness approaching appearance of mild icthyosis.   Pertinent labs:  Lab Results  Component Value Date   TSH 2.13 01/19/2018   Lab Results  Component Value Date   WBC 13.7 (H) 06/10/2018   HGB 12.4 (L) 06/10/2018   HCT 40.2 06/10/2018   MCV 92.8 06/10/2018   PLT 218 06/10/2018   Lab Results  Component Value Date   CREATININE 1.51 (H) 07/07/2018   BUN 26 (H) 07/07/2018   NA 136 07/07/2018   K 4.7 07/07/2018   CL 98 07/07/2018   CO2 28 07/07/2018   Lab Results  Component Value Date   ALT 16 (L) 10/29/2017   AST 26 10/29/2017   ALKPHOS 96 10/29/2017   BILITOT 1.1 10/29/2017   Lab Results  Component Value Date   CHOL 140 11/12/2017   Lab  Results  Component Value Date   HDL 37 (L) 11/12/2017   Lab Results  Component Value Date   LDLCALC 53 11/12/2017   Lab Results  Component Value Date   TRIG 252 (H) 11/12/2017   Lab Results  Component Value Date   CHOLHDL 3.8 11/12/2017   Lab Results  Component Value Date   HGBA1C 9.2 (A) 07/19/2018    ASSESSMENT AND PLAN:   New pt;  1) DM 2; control fair. A1c today is 9.2%. Continue current glipizide xl 2.5mg , 2 tabs qAM and 1 tab qPM. Increase jardiance to 25mg  qd.   Rx for new glucometer ([facility] [address] Lytes/cr stable at 06/10/2018 CHF clinic f/u. (Pt's renal function is borderline adequate to increase/continue jardiance, so I'll have him back in 1 wk for review of his glucoses and recheck BMET at that time).  2) Ischemic and nonischemic CM: stable NYHA class III  sx's. Continue current meds at current doses. Most recent lytes/cr stable 06/10/2018. Will be rechecking these at f/u here in 1 week. He'll continue to f/u with CHF clinic.  3) COPD: ? meds (pulmicort bid + brovana + prn duonebs?--I need to clarify this with pt at next f/u visit in 1 wk. Followed by Dr. Sherene SiresWert. Needs to quit smoking. Warned pt of dangers of ongoing smoking near oxygen + smoking with COPD/HF/CAD.  4) Chronic RF with hypoxia and hypercarbia: he'll continue with 2-4 L oxygen depending on whether at rest or ambulating. Oxygen via Pulm office.  5) Chronic a-fib: continue eliquis.  6) RAsh, appearance and distribution c/w either psoriasis or seb derm. Mildly symptomatic at this time.  Responds ok to otc hydrocortisone "but keeps coming back". Trial of cutivate 0.05% cream bid prn. Consider trial of ketoconazole shampoo in future. Consider derm referral in near future.  7) CRI stage III (GFR 40s): monitor closely for any persistent decrease below GFR 40 ml/min-->would need to stop jardiance, adjust eliquis, etc. BMET next week at f/u visit.  An After Visit Summary was printed and given to the patient.  Return in about 1 week (around 07/26/2018) for f/u DM and get BMET.  Signed:  Santiago BumpersPhil Nayan Proch, MD           07/19/2018

## 2018-07-28 ENCOUNTER — Ambulatory Visit: Payer: Self-pay | Admitting: Family Medicine

## 2018-07-29 ENCOUNTER — Encounter: Payer: Self-pay | Admitting: Internal Medicine

## 2018-07-29 ENCOUNTER — Ambulatory Visit: Payer: Self-pay | Admitting: Neurology

## 2018-07-29 ENCOUNTER — Ambulatory Visit (INDEPENDENT_AMBULATORY_CARE_PROVIDER_SITE_OTHER): Payer: Medicare Other | Admitting: Internal Medicine

## 2018-07-29 VITALS — BP 124/70 | HR 68 | Ht 65.0 in | Wt 164.0 lb

## 2018-07-29 DIAGNOSIS — Z95810 Presence of automatic (implantable) cardiac defibrillator: Secondary | ICD-10-CM

## 2018-07-29 DIAGNOSIS — I5022 Chronic systolic (congestive) heart failure: Secondary | ICD-10-CM | POA: Diagnosis not present

## 2018-07-29 DIAGNOSIS — I482 Chronic atrial fibrillation, unspecified: Secondary | ICD-10-CM | POA: Diagnosis not present

## 2018-07-29 DIAGNOSIS — I10 Essential (primary) hypertension: Secondary | ICD-10-CM | POA: Diagnosis not present

## 2018-07-29 IMAGING — CT CT CHEST W/O CM
2 of 4 series · 15 of 36 positions shown, 18 images · non-contrast
Comparison: CT scan February 07, 2014.  Chest x-ray October 21, 2017.

CLINICAL DATA: Decreased appetite.  Acute respiratory illness.

EXAM:
CT CHEST WITHOUT CONTRAST
TECHNIQUE: Multidetector CT imaging of the chest was performed following the
standard protocol without IV contrast.

[Series 3: chest wo · axial · 0.65mm/px · z∈[+1117,+1385]mm · 12 of 160 slices shown, 15 images]
[im 13/160  mediastinal]
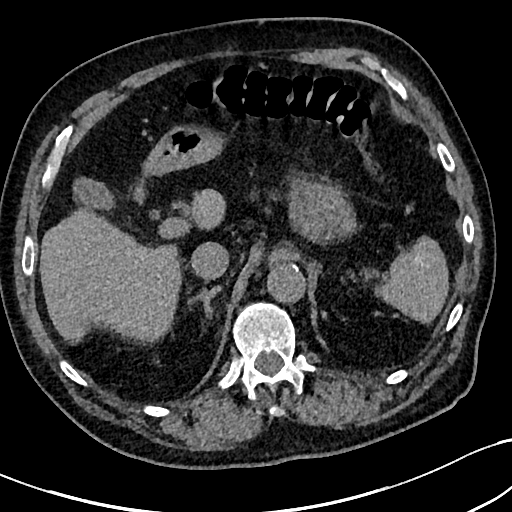
[im 13/160  lung]
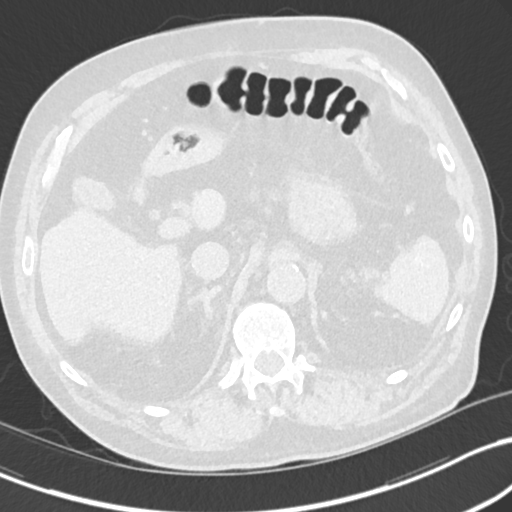
[im 25/160  lung]
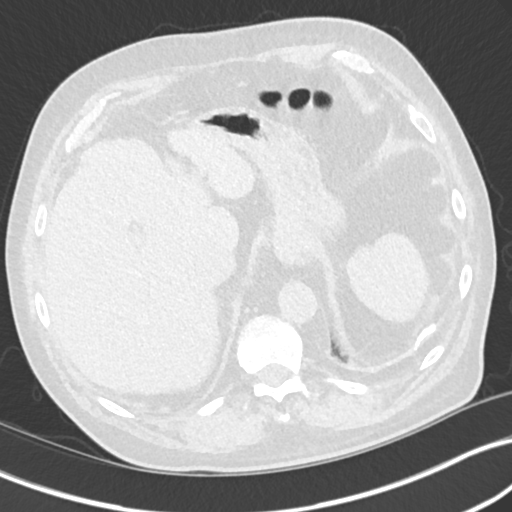
[im 37/160  lung]
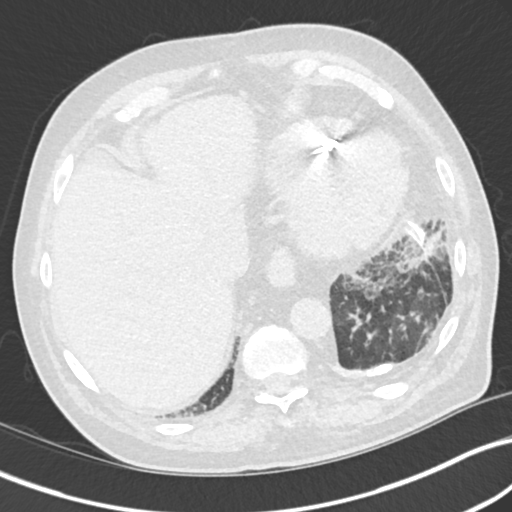
[im 49/160  lung]
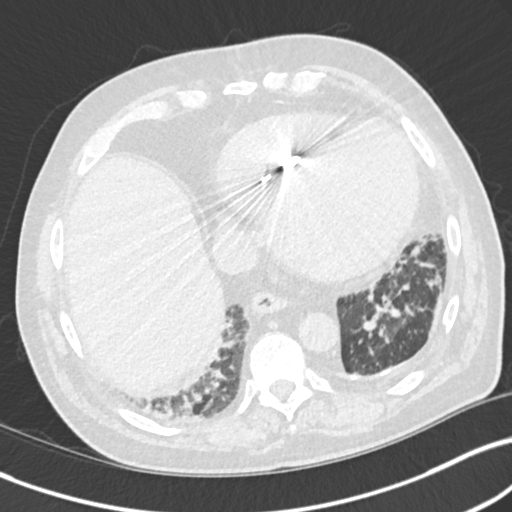
[im 62/160  mediastinal]
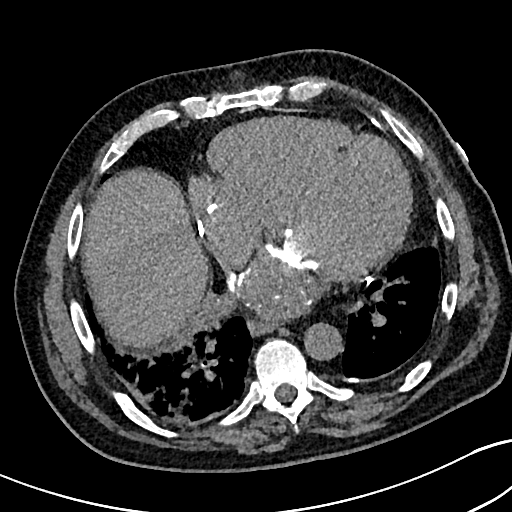
[im 62/160  lung]
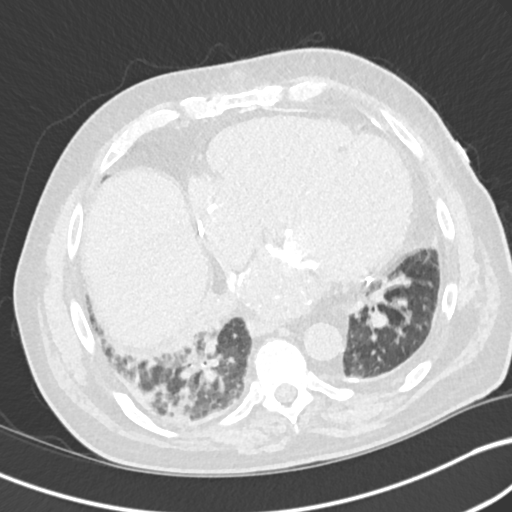
[im 74/160  lung]
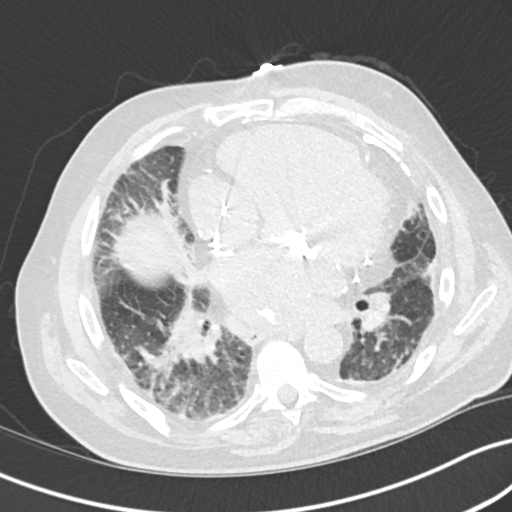
[im 86/160  lung]
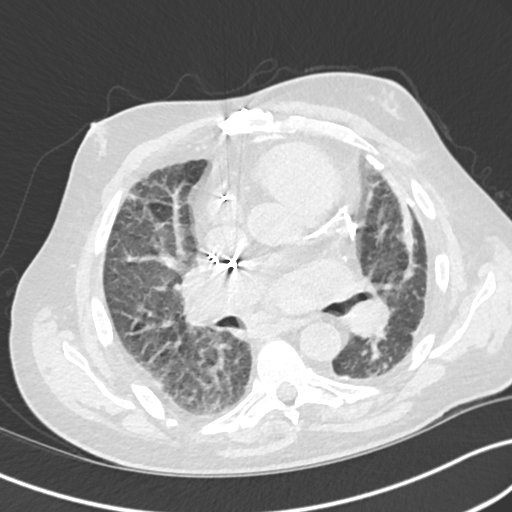
[im 98/160  lung]
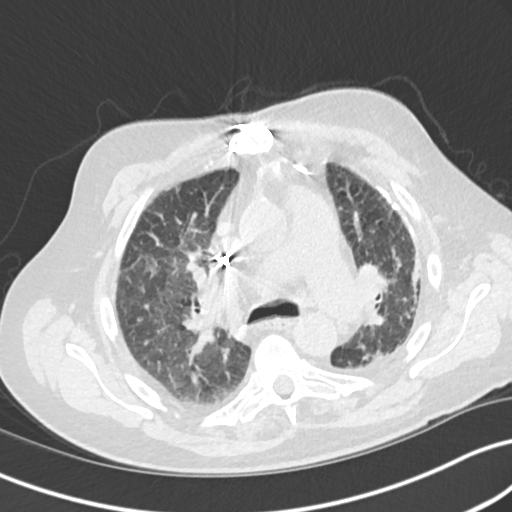
[im 111/160  mediastinal]
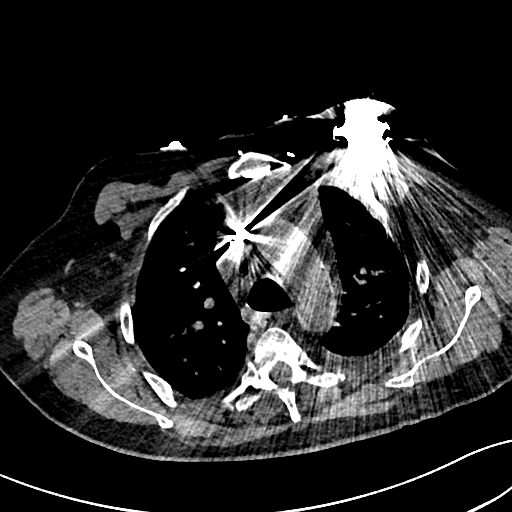
[im 111/160  lung]
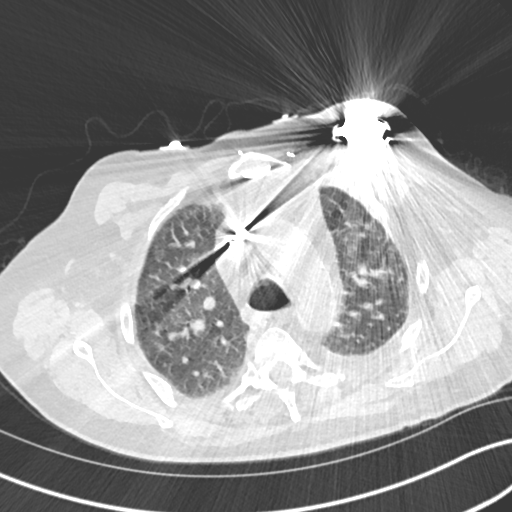
[im 123/160  lung]
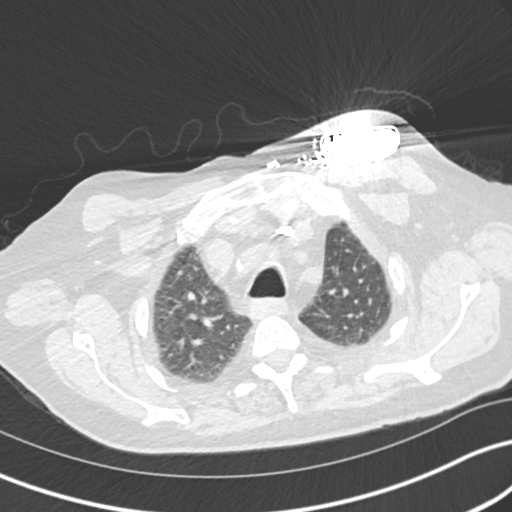
[im 135/160  lung]
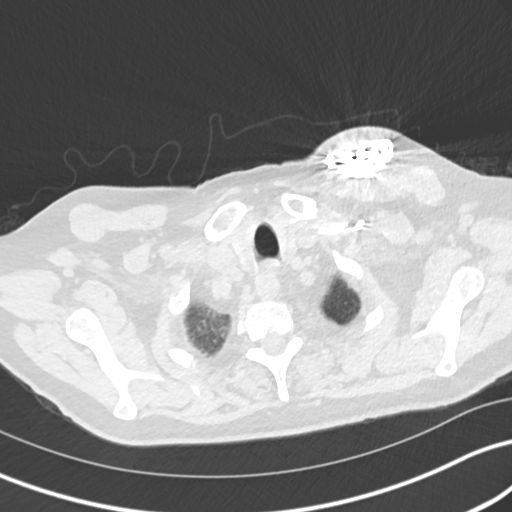
[im 147/160  lung]
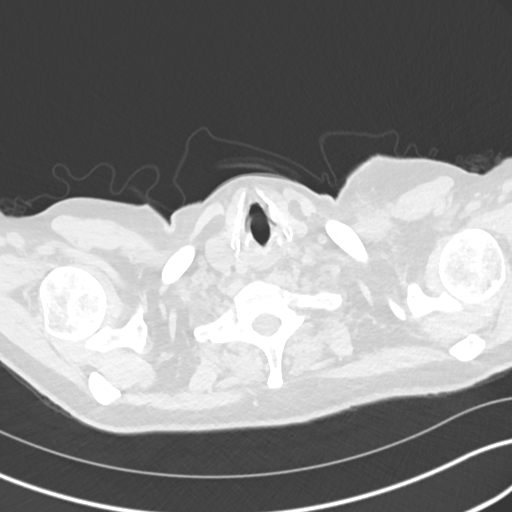

[Series 6: cor · coronal · 0.58mm/px · 3 of 151 slices shown]
[im 31/151  lung]
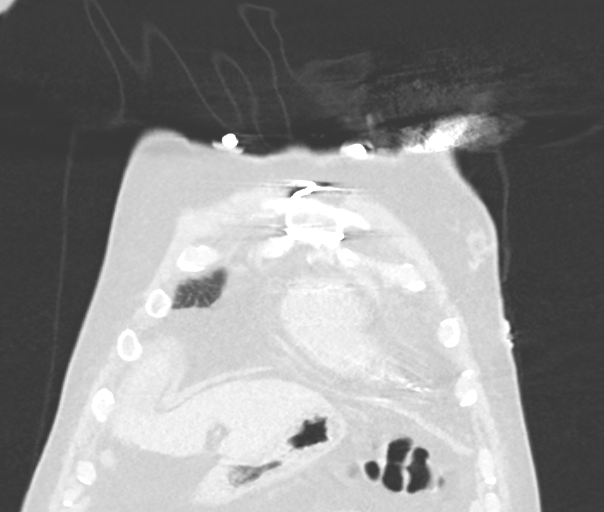
[im 61/151  lung]
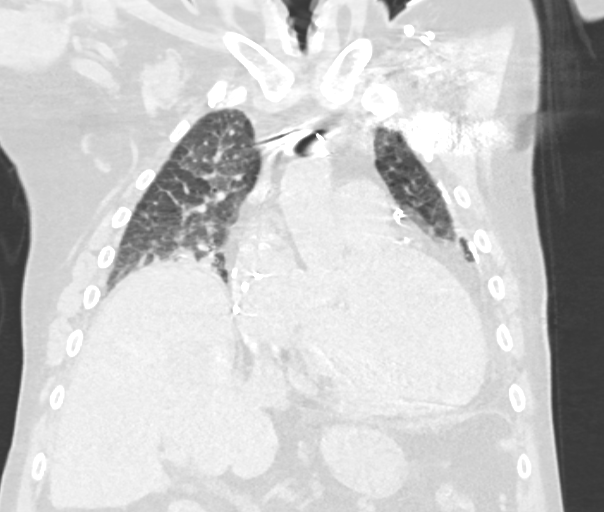
[im 91/151  lung]
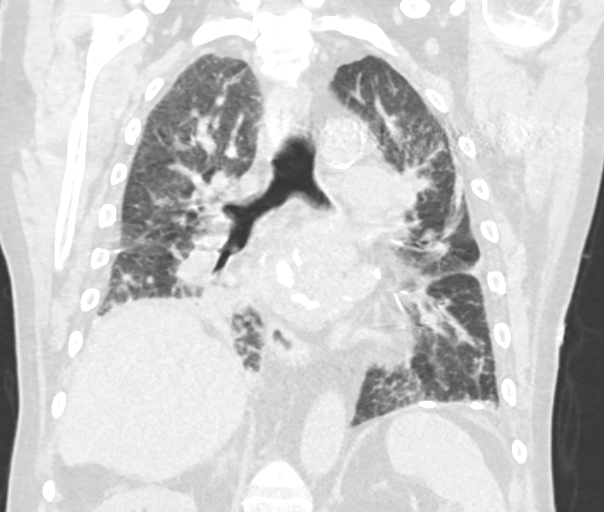

[15 of 36 positions shown; findings below may reference images not displayed]

FINDINGS: Cardiovascular: Atherosclerotic changes in the nonaneurysmal aorta.
The main pulmonary artery measures 3.2 cm. Cardiomegaly noted.
Coronary artery calcifications identified.

Mediastinum/Nodes: The thyroid and esophagus are normal. Stable
shotty nodes in the mediastinum are likely reactive. Chest wall is
normal. No effusions.

Lungs/Pleura: Central airways are normal. No pneumothorax.
Atelectasis in the left lung on axial image 42. Chronic changes in
the left base. Focal infiltrate in the right base. Probable
pulmonary venous congestion. Opacity in the right middle lobe may
represent atelectasis versus infiltrate. No suspicious nodules or
masses.

Upper Abdomen: Cholelithiasis in the gallbladder. The gallbladder
wall is mildly prominent, possibly due to poor distention.

Musculoskeletal: No chest wall mass or suspicious bone lesions
identified.
IMPRESSION: 1. Marked cardiomegaly and pulmonary venous congestion.
2. Focal infiltrate in the right base and perhaps the right middle
lobe is suggestive of pneumonia or aspiration. Recommend follow-up
to resolution.
3. Atherosclerotic changes in the nonaneurysmal aorta. Coronary
artery calcifications.
4. The main pulmonary artery is mildly dilated measuring 3.2 cm
raising the possibility of pulmonary arterial hypertension.
5. Shotty nodes in the mediastinum are likely reactive.
6. Cholelithiasis. The gallbladder wall is mildly prominent,
possibly due to poor distention. Recommend clinical correlation.

Aortic Atherosclerosis ([id number].E).

## 2018-07-29 IMAGING — CR DG CHEST 2V
2 series · 2 of 2 positions shown · non-contrast
Comparison: 03/02/2016

CLINICAL DATA: Failure to thrive.  Hypoxia.

EXAM:
CHEST - 2 VIEW

[chest pa]
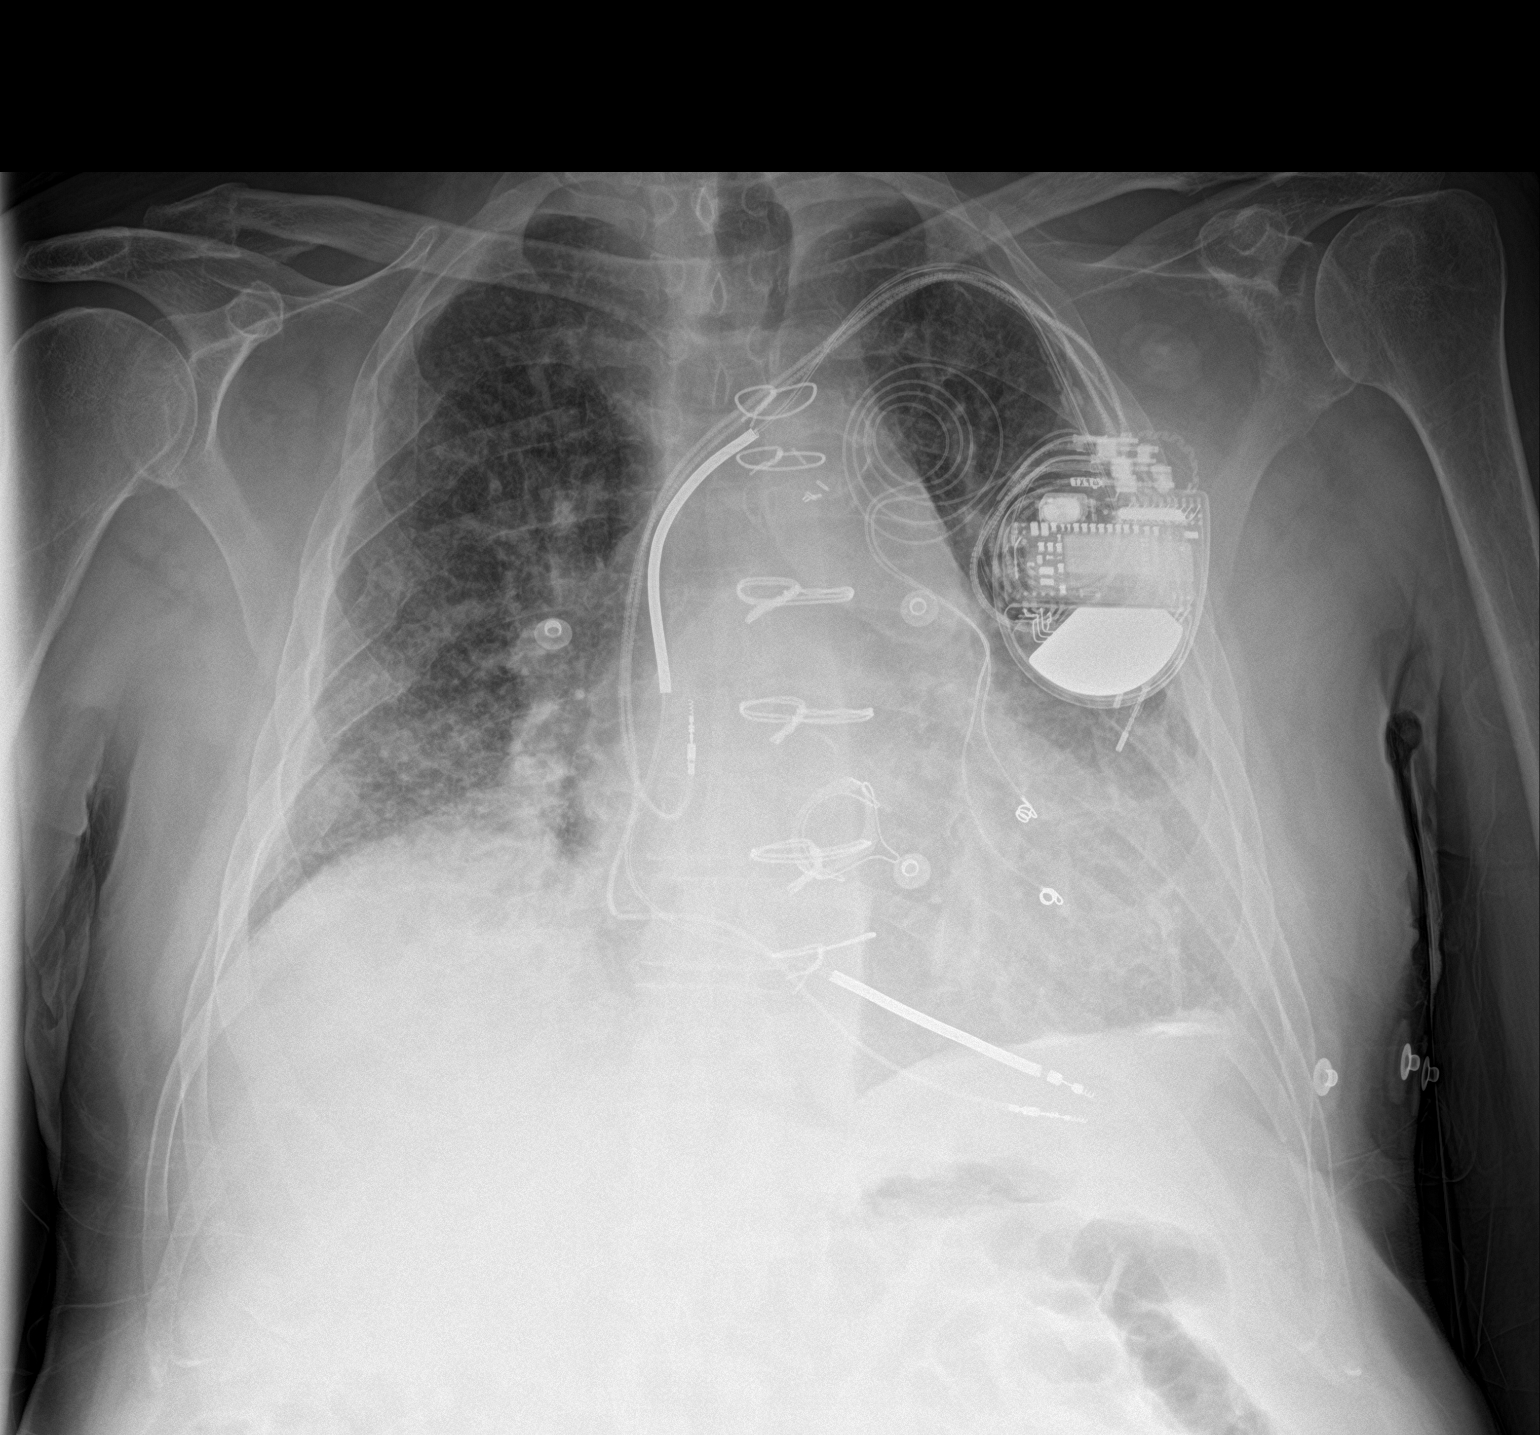

[chest lat]
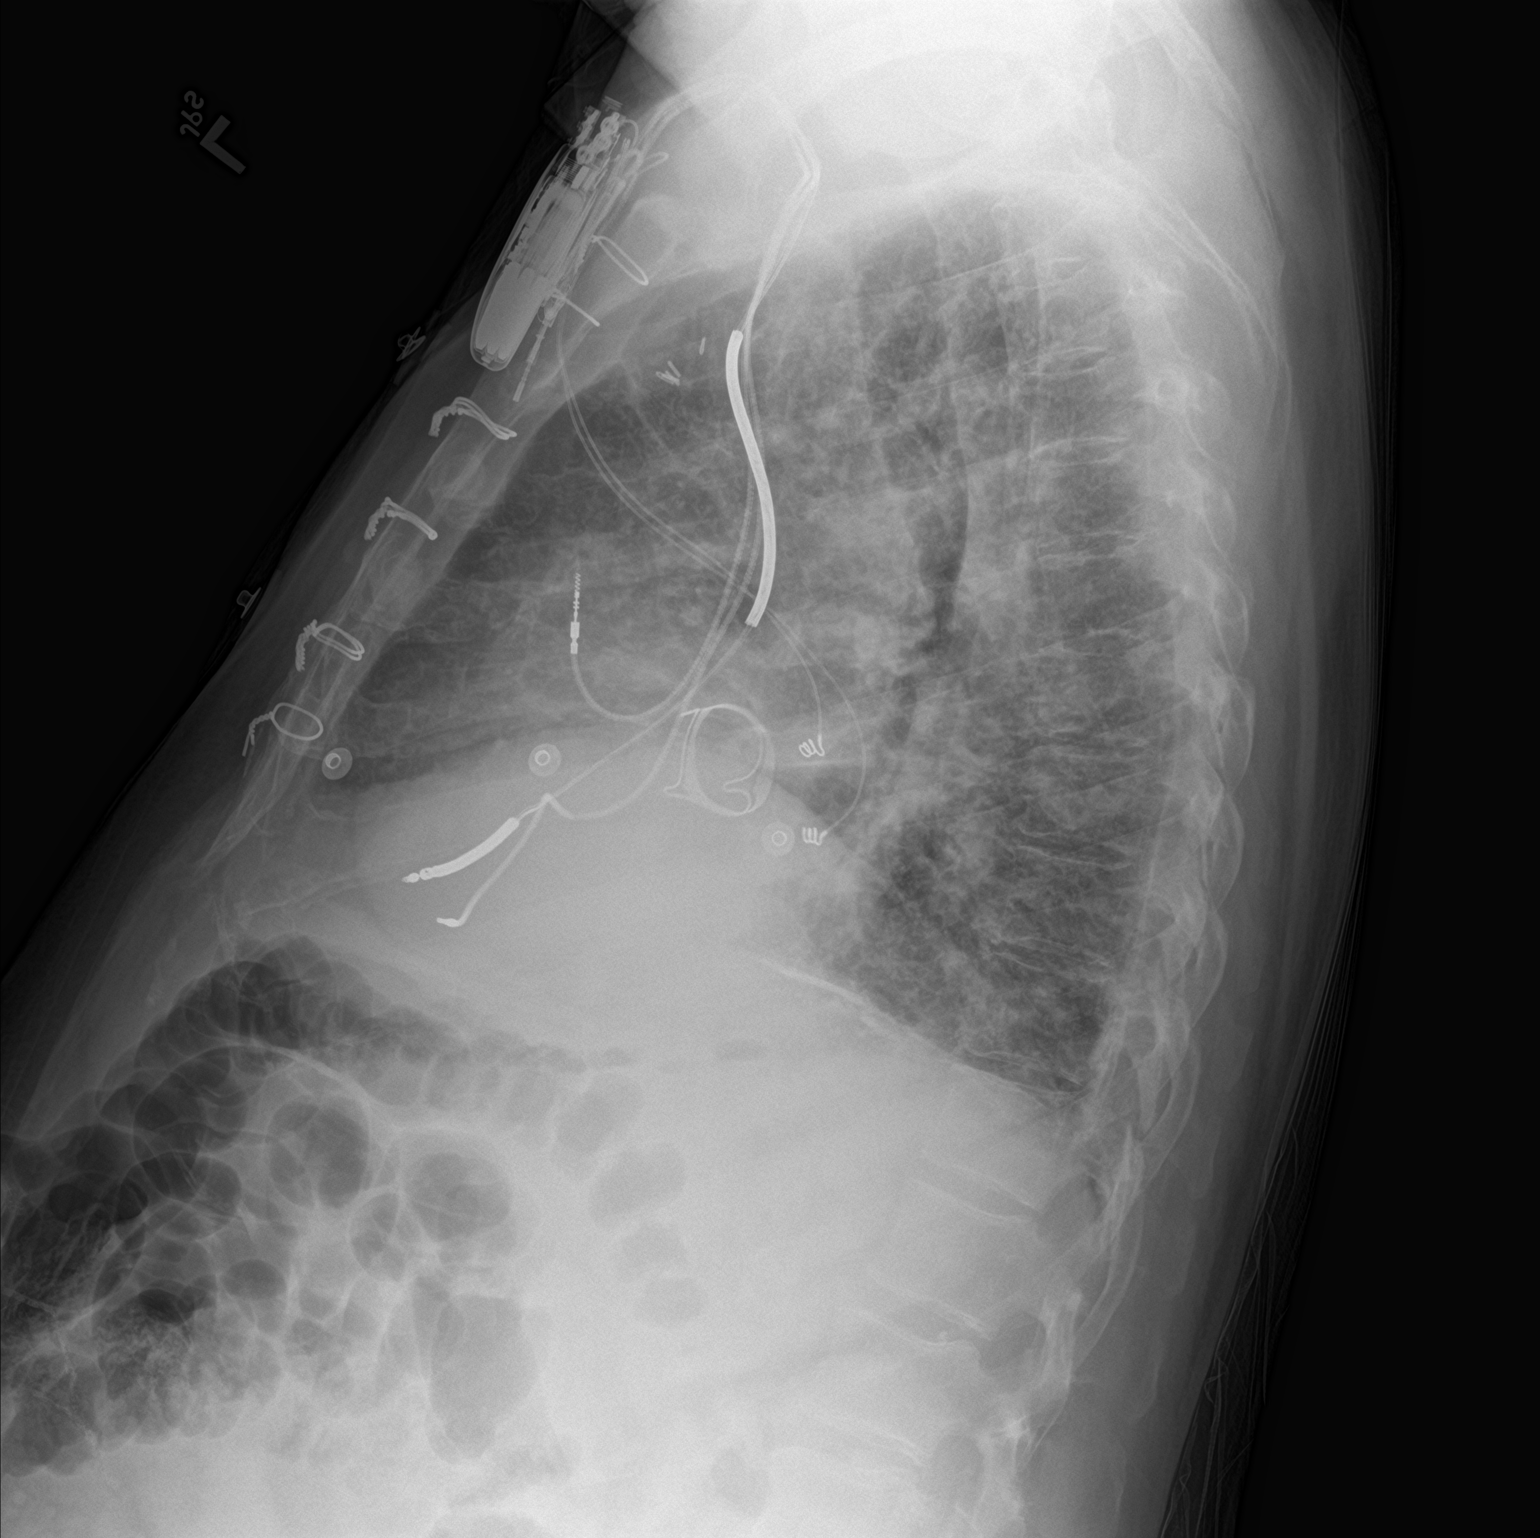

[2 of 2 positions shown; findings below may reference images not displayed]

FINDINGS: Bilateral diffuse mild interstitial thickening. Prominence of the
central pulmonary vasculature. No focal consolidation, pleural
effusion or pneumothorax. Calcified pleural plaque along the left
diaphragmatic surface. Stable cardiomegaly. Prior median sternotomy
and mitral valve replacement. Cardiac pacemaker is present. No acute
osseous abnormality.
IMPRESSION: 1. Cardiomegaly with pulmonary vascular congestion.
2. Likely underlying mild chronic interstitial lung disease.

## 2018-07-29 NOTE — Progress Notes (Signed)
HPI [name] returns today after a long visit from my clinic. He is a pleasant 73 yo man with longstanding ICM, chronic systolic heart failure, s/p ICD insertion. He has continued to have problems with sodium indiscretion but his family has tried to keep him away form salty food. Allergies  Allergen Reactions  . Warfarin Other (See Comments)    Non compliance and ETOH abuse     Current Outpatient Medications  Medication Sig Dispense Refill  . arformoterol (BROVANA) 15 MCG/2ML NEBU Take 2 mLs (15 mcg total) by nebulization 2 (two) times daily for 15 days. 120 mL 1  . atorvastatin (LIPITOR) 40 MG tablet Take 1 tablet (40 mg total) by mouth daily at 6 PM. 90 tablet 1  . blood glucose meter kit and supplies Use to check blood sugar twice daily 1 each 0  . budesonide (PULMICORT) 0.5 MG/2ML nebulizer solution Take 2 mLs (0.5 mg total) by nebulization 2 (two) times daily. DX: J44.9 120 mL 4  . carvedilol (COREG) 6.25 MG tablet Take 1 tablet by mouth 2 (two) times daily.    . digoxin (LANOXIN) 0.125 MG tablet Take 1 tablet (0.125 mg total) by mouth daily. 90 tablet 3  . ELIQUIS 5 MG TABS tablet Take 1 tablet (5 mg total) by mouth 2 (two) times daily. 180 tablet 3  . empagliflozin (JARDIANCE) 25 MG TABS tablet Take 25 mg by mouth daily. 30 tablet 0  . fluticasone (CUTIVATE) 0.05 % cream Apply topically 2 (two) times daily. 60 g 1  . glipiZIDE (GLUCOTROL XL) 2.5 MG 24 hr tablet Take 2 tab every morning and 1 tab every evening    . ipratropium-albuterol (DUONEB) 0.5-2.5 (3) MG/3ML SOLN Take 3 mLs by nebulization every 4 (four) hours as needed. 360 mL 0  . lactulose (CHRONULAC) 10 GM/15ML solution TAKE 30 MILLILITERS BY MOUTH TWICE A DAY (Patient taking differently: TAKE 45 MILLILITERS BY MOUTH daily) 1800 mL 6  . loratadine (CLARITIN) 10 MG tablet Take 1 tablet (10 mg total) by mouth daily. 30 tablet 1  . losartan (COZAAR) 25 MG tablet Take 1 tablet by mouth daily.    . Multiple Vitamin  (MULTIVITAMIN WITH MINERALS) TABS tablet Take 1 tablet by mouth daily. 30 tablet 2  . nitroGLYCERIN (NITROSTAT) 0.4 MG SL tablet Place 1 tablet (0.4 mg total) under the tongue every 5 (five) minutes as needed for chest pain. 25 tablet 2  . OXYGEN Inhale 2.5 L into the lungs continuous.     Marland Kitchen spironolactone (ALDACTONE) 25 MG tablet Take 1 tablet (25 mg total) by mouth daily. 30 tablet 6  . thiamine 100 MG tablet Take 1 tablet (100 mg total) by mouth daily. 30 tablet 0  . torsemide (DEMADEX) 20 MG tablet Take 4 tablets (80 mg total) by mouth daily. 120 tablet 6   No current facility-administered medications for this visit.      Past Medical History:  Diagnosis Date  . AICD (automatic cardioverter/defibrillator) present   . Alcohol abuse   . Anxiety   . Bipolar disorder ([place])   . Biventricular automatic implantable cardioverter defibrillator in situ    a. 01/2014 s/p MDT DTBA1D1 Viva XT CRT-D (ser # [id number]).  . CAD (coronary artery disease)    a. s/p prior PCI/stenting of the LCX;  b. 08/2014 MV: EF 20%, large septal, apical, and inferior infarct from apex to base, no ischemia;  c. 08/2014 NSTEMI/Cath: LM mod distal dzs extending into ostial LCX (80%),  LAD tortuous, RI nl, OM mod dzs, RCA 100 CTO with R->R and L->R collats-->Med Rx.  . CHF (congestive heart failure) ([place])   . Chronic atrial fibrillation    a. CHA2DS2VASc = 6->eliquis;  b. S/P AVN RFCA an BiV ICD placement.  . Chronic respiratory failure ([place])   . Chronic systolic congestive heart failure ([place])    a. 01/2014 Echo: EF 25-30%, mod conc LVH, mod dil LA.  . CKD (chronic kidney disease)    a. suspect stage II-III based on historical labs.  Stage III (GFR 40s)  . Complete heart block ([place])    a. In setting of prior AV nodal ablation r/t afib-->BiV ICD (01/2014).  . COPD (chronic obstructive pulmonary disease) ([place])   . CVA (cerebral vascular accident) ([place])    a. Multiple prior embolic strokes.  . Dementia ([place])   . Depression     . Diabetes mellitus ([place])   . DVT (deep venous thrombosis) ([place])   . Dyslipidemia   . Hyperglycemia   . Hypertension   . Mitral valve disease    a. remote mitral replacement with Bjork Shiley valve;  b. 06/2006 Redo MVR with tissue valve.  . Mixed Ischemic and Nonischemic Cardiomyopathy    a. 8/.2015 Echo: Ef 25-30%;  b. 01/2014 s/p MDT [id number] Pershing Proud XT CRT-D (ser # [id number]).  . On home oxygen therapy    "2L; 24/7" (65/16/2019)  . Psoriasis    "back of his head; elbows; finger of left hand" (06/07/2017)  . Pure hypercholesterolemia   . S/P AV nodal ablation   . Tobacco dependence    current as of 07/2018  . Ventral hernia     ROS:   All systems reviewed and negative except as noted in the HPI.   Past Surgical History:  Procedure Laterality Date  . AV NODE ABLATION  2007  . CARDIAC CATHETERIZATION  06/2006   Mild ostial L main stenosis, CFX stent patent, RCA occluded (old)  . CORONARY ARTERY BYPASS GRAFT  2008  . CYSTOSCOPY W/ URETERAL STENT PLACEMENT  07/12/2011   Procedure: CYSTOSCOPY WITH RETROGRADE PYELOGRAM/URETERAL STENT PLACEMENT;  Surgeon: Alexis Frock, MD;  Location: [facility];  Service: Urology;  Laterality: Left;  . ECHO DOPPLER COMPLETE(TRANSTHOR) ([facility] HX)  04/07/2017   LV EF: 25%-30%  . EXPLORATORY LAPAROTOMY    . HERNIA REPAIR    . ICD GENERATOR CHANGE  02/01/2014   Gen change to: Medtronic VIVA pulse generator, serial number [id number]  . INSERT / REPLACE / REMOVE PACEMAKER    . LEFT HEART CATHETERIZATION WITH CORONARY ANGIOGRAM N/A 08/23/2014   Procedure: LEFT HEART CATHETERIZATION WITH CORONARY ANGIOGRAM;  Surgeon: Jettie Booze, MD;  Location: [facility] CATH LAB;  Service: Cardiovascular;  Laterality: N/A;  . MITRAL VALVE REPLACEMENT     remote [facility] valve 1962 with redo tissue valve 06/2006  . PACEMAKER GENERATOR CHANGE N/A 02/01/2014   Procedure: PACEMAKER GENERATOR CHANGE;  Surgeon: Deboraha Sprang, MD;  [facility];  Service: Cardiovascular;   Laterality: N/A;  . PACEMAKER PLACEMENT  2006   Changed to CRT-D in 2009  . RIGHT/LEFT HEART CATH AND CORONARY ANGIOGRAPHY N/A 06/09/2017   Procedure: RIGHT/LEFT HEART CATH AND CORONARY ANGIOGRAPHY;  Surgeon: Larey Dresser, MD;  Location: [place] CV LAB;  Service: Cardiovascular;  Laterality: N/A;  . TRANSTHORACIC ECHOCARDIOGRAM  10/2017   EF 30-35%, inf-lat and inf akin, MV bioprosth ok, mod dec RV fxn, pulm HTN (peak 67).  . VASECTOMY  Family History  Problem Relation Age of Onset  . Alzheimer's disease Mother   . CAD Mother   . Heart attack Father   . Heart disease Father   . CAD Father   . Heart attack Son   . Varicose Veins Son   . Deep vein thrombosis Son   . Heart disease Son   . Stroke Other   . Heart disease Other   . Heart disease Brother   . CAD Brother      Social History   Socioeconomic History  . Marital status: Divorced    Spouse name: Not on file  . Number of children: Not on file  . Years of education: Not on file  . Highest education level: Not on file  Occupational History  . Occupation: Retired Administrator  . Financial resource strain: Not on file  . Food insecurity:    Worry: Not on file    Inability: Not on file  . Transportation needs:    Medical: Not on file    Non-medical: Not on file  Tobacco Use  . Smoking status: Current Some Day Smoker    Packs/day: 2.00    Years: 45.00    Pack years: 90.00    Types: Cigarettes  . Smokeless tobacco: Never Used  . Tobacco comment: daughter states he cannot get to cigarettes now few cigarettes a day  Substance and Sexual Activity  . Alcohol use: Not Currently    Alcohol/week: 21.0 standard drinks    Types: 21 Cans of beer per week    Comment: 10/21/2017 "~ 2-3 beers/day"  . Drug use: No  . Sexual activity: Not Currently  Lifestyle  . Physical activity:    Days per week: Not on file    Minutes per session: Not on file  . Stress: Not on file  Relationships  . Social  connections:    Talks on phone: Not on file    Gets together: Not on file    Attends religious service: Not on file    Active member of club or organization: Not on file    Attends meetings of clubs or organizations: Not on file    Relationship status: Not on file  . Intimate partner violence:    Fear of current or ex partner: Not on file    Emotionally abused: Not on file    Physically abused: Not on file    Forced sexual activity: Not on file  Other Topics Concern  . Not on file  Social History Narrative   Pt is divorced, has one daughter and one son.   Pt lives in 1 story home with his daughter and her family.   10th grade education.   Has lived in [place] all his life.   Worked in Estate agent, retired.   Still smoking as of 07/2018.   Quit drinking around 2017.         BP 124/70   Pulse 68   Ht _0  (1.651 m)   Wt 164 lb (74.4 kg)   SpO2 97%   BMI 27.29 kg/m   Physical Exam:  Well appearing NAD HEENT: Unremarkable Neck:  No JVD, no thyromegally Lymphatics:  No adenopathy Back:  No CVA tenderness Lungs:  Clear with no wheezes HEART:  Regular rate rhythm, no murmurs, no rubs, no clicks Abd:  soft, positive bowel sounds, no organomegally, no rebound, no guarding Ext:  2 plus pulses, no edema, no cyanosis, no clubbing Skin:  No  rashes no nodules Neuro:  CN II through XII intact, motor grossly intact  EKG - atrial fib with ventricular pacing  DEVICE  Normal device function.  See PaceArt for details.   Assess/Plan: 1. Chronic systolic heart failure - his symptoms are class 3. He will continue his current meds. I asked him to stop eating salty foods. 2. ICD - his medtronic biv ICD is working normally. 3. Atrial fib - he is s/p AV node ablation and his rates are well controlled. 4. CAD - he denies anginal symptoms. He will continue his current meds.  [name].D.

## 2018-07-29 NOTE — Patient Instructions (Signed)
Medication Instructions:  Your physician recommends that you continue on your current medications as directed. Please refer to the Current Medication list given to you today.  Labwork: None ordered.  Testing/Procedures: None ordered.  Follow-Up: Your physician wants you to follow-up in: one year with Dr. Ladona Ridgel.   You will receive a reminder letter in the mail two months in advance. If you don't receive a letter, please call our office to schedule the follow-up appointment.  Remote monitoring is used to monitor your ICD from home. This monitoring reduces the number of office visits required to check your device to one time per year. It allows [place] to keep an eye on the functioning of your device to ensure it is working properly. You are scheduled for a device check from home on 08/22/2018. You may send your transmission at any time that day. If you have a wireless device, the transmission will be sent automatically. After your physician reviews your transmission, you will receive a postcard with your next transmission date.  Any Other Special Instructions Will Be Listed Below (If Applicable).  If you need a refill on your cardiac medications before your next appointment, please call your pharmacy.

## 2018-08-01 ENCOUNTER — Other Ambulatory Visit: Payer: Self-pay | Admitting: Family Medicine

## 2018-08-01 ENCOUNTER — Ambulatory Visit (INDEPENDENT_AMBULATORY_CARE_PROVIDER_SITE_OTHER): Payer: Medicare Other | Admitting: Family Medicine

## 2018-08-01 ENCOUNTER — Encounter: Payer: Self-pay | Admitting: Family Medicine

## 2018-08-01 VITALS — BP 105/61 | HR 64 | Temp 97.5°F | Resp 16 | Ht 65.0 in | Wt 166.0 lb

## 2018-08-01 DIAGNOSIS — I5022 Chronic systolic (congestive) heart failure: Secondary | ICD-10-CM | POA: Diagnosis not present

## 2018-08-01 DIAGNOSIS — E118 Type 2 diabetes mellitus with unspecified complications: Secondary | ICD-10-CM | POA: Diagnosis not present

## 2018-08-01 LAB — CUP PACEART INCLINIC DEVICE CHECK
Battery Voltage: 2.91 V
Brady Statistic AP VS Percent: 0 %
Brady Statistic AS VP Percent: 95.32 %
Brady Statistic AS VS Percent: 4.68 %
Brady Statistic RA Percent Paced: 0 %
Brady Statistic RV Percent Paced: 96.41 %
Date Time Interrogation Session: 20200221200520
HighPow Impedance: 48 Ohm
HighPow Impedance: 62 Ohm
Implantable Lead Implant Date: 20060307
Implantable Lead Implant Date: 20080110
Implantable Lead Implant Date: 20090617
Implantable Lead Location: 753858
Implantable Lead Location: 753860
Implantable Lead Model: 6947
Implantable Pulse Generator Implant Date: 20150827
Lead Channel Impedance Value: 399 Ohm
Lead Channel Impedance Value: 4047 Ohm
Lead Channel Impedance Value: 589 Ohm
Lead Channel Impedance Value: 779 Ohm
Lead Channel Impedance Value: 817 Ohm
Lead Channel Pacing Threshold Amplitude: 0.625 V
Lead Channel Pacing Threshold Amplitude: 1.375 V
Lead Channel Pacing Threshold Pulse Width: 0.4 ms
Lead Channel Pacing Threshold Pulse Width: 0.4 ms
Lead Channel Sensing Intrinsic Amplitude: 0.5 mV
Lead Channel Sensing Intrinsic Amplitude: 0.75 mV
Lead Channel Sensing Intrinsic Amplitude: 24.5 mV
Lead Channel Sensing Intrinsic Amplitude: 24.5 mV
Lead Channel Setting Pacing Amplitude: 2.5 V
Lead Channel Setting Pacing Amplitude: 2.5 V
Lead Channel Setting Pacing Pulse Width: 0.4 ms
Lead Channel Setting Pacing Pulse Width: 0.4 ms
Lead Channel Setting Sensing Sensitivity: 0.45 mV
MDC IDC LEAD LOCATION: 753859
MDC IDC MSMT BATTERY REMAINING LONGEVITY: 23 mo
MDC IDC MSMT LEADCHNL LV IMPEDANCE VALUE: 4047 Ohm
MDC IDC MSMT LEADCHNL LV PACING THRESHOLD PULSEWIDTH: 0.4 ms
MDC IDC MSMT LEADCHNL RV PACING THRESHOLD AMPLITUDE: 0.75 V
MDC IDC STAT BRADY AP VP PERCENT: 0 %

## 2018-08-01 LAB — BASIC METABOLIC PANEL
BUN: 47 mg/dL — AB (ref 6–23)
CO2: 30 meq/L (ref 19–32)
Calcium: 8.9 mg/dL (ref 8.4–10.5)
Chloride: 94 mEq/L — ABNORMAL LOW (ref 96–112)
Creatinine, Ser: 1.73 mg/dL — ABNORMAL HIGH (ref 0.40–1.50)
GFR: 39.12 mL/min — ABNORMAL LOW (ref >60.00)
Glucose, Bld: 344 mg/dL — ABNORMAL HIGH (ref 70–99)
Potassium: 4.5 mEq/L (ref 3.5–5.1)
Sodium: 135 mEq/L (ref 135–145)

## 2018-08-01 MED ORDER — INSULIN GLARGINE 100 UNIT/ML SOLOSTAR PEN: PEN_INJECTOR | SUBCUTANEOUS | 6 refills | Status: DC

## 2018-08-01 MED ORDER — FLUTICASONE PROPIONATE 0.05 % EX CREA: TOPICAL_CREAM | Freq: Two times a day (BID) | CUTANEOUS | 6 refills | Status: DC

## 2018-08-01 NOTE — Progress Notes (Signed)
OFFICE VISIT  08/01/2018   CC:  Chief Complaint  Patient presents with  . Follow-up    1 week F/U. Not fasting. Pt had fluid pill at 6:30 this AM and has urinated 1x.    HPI:    Patient is a 73 y.o. Caucasian male who presents for 2 week f/u DM 2. Increased jardiance to 94m qd as of 07/19/2018 when A1c was 9.2%. Wanted to see what pt's home glucoses were with new glucometer/strips before making any changes.  Also needs updated BMET.  Home glucoses: 254 fasting this morning, which is the highest reading since going up on jardiance dosing.  Some readings as low as 150.  Checking glucose once every morning.  Family is trying to help keep him on low Na diet, but it is questionable whether this is helpful or not. He doesn't feeling like his swelling is any different than usual. Appetite is great.   No change in baselind DOE.  Past Medical History:  Diagnosis Date  . AICD (automatic cardioverter/defibrillator) present   . Alcohol abuse   . Anxiety   . Bipolar disorder ([place]   . Biventricular automatic implantable cardioverter defibrillator in situ    a. 01/2014 s/p MDT DTBA1D1 Viva XT CRT-D (ser # [id number]).  . CAD (coronary artery disease)    a. s/p prior PCI/stenting of the LCX;  b. 08/2014 MV: EF 20%, large septal, apical, and inferior infarct from apex to base, no ischemia;  c. 08/2014 NSTEMI/Cath: LM mod distal dzs extending into ostial LCX (80%), LAD tortuous, RI nl, OM mod dzs, RCA 100 CTO with R->R and L->R collats-->Med Rx.  . CHF (congestive heart failure) ([place]   . Chronic atrial fibrillation    a. CHA2DS2VASc = 6->eliquis;  b. S/P AVN RFCA an BiV ICD placement.  . Chronic respiratory failure ([place]   . Chronic systolic congestive heart failure ([place]    a. 01/2014 Echo: EF 25-30%, mod conc LVH, mod dil LA.  . CKD (chronic kidney disease)    a. suspect stage II-III based on historical labs.  Stage III (GFR 40s)  . Complete heart block ([place]    a. In setting of prior AV nodal  ablation r/t afib-->BiV ICD (01/2014).  . COPD (chronic obstructive pulmonary disease) ([place]   . CVA (cerebral vascular accident) ([place]    a. Multiple prior embolic strokes.  . Dementia ([place]   . Depression   . Diabetes mellitus ([place]   . DVT (deep venous thrombosis) ([place]   . Dyslipidemia   . Hyperglycemia   . Hypertension   . Mitral valve disease    a. remote mitral replacement with Bjork Shiley valve;  b. 06/2006 Redo MVR with tissue valve.  . Mixed Ischemic and Nonischemic Cardiomyopathy    a. 8/.2015 Echo: Ef 25-30%;  b. 01/2014 s/p MDT [id number] ProudXT CRT-D (ser # [id number]).  . On home oxygen therapy    "2L; 24/7" (65/16/2019)  . Psoriasis    "back of his head; elbows; finger of left hand" (06/07/2017)  . Pure hypercholesterolemia   . S/P AV nodal ablation   . Tobacco dependence    current as of 07/2018  . Ventral hernia     Past Surgical History:  Procedure Laterality Date  . AV NODE ABLATION  2007  . CARDIAC CATHETERIZATION  06/2006   Mild ostial L main stenosis, CFX stent patent, RCA occluded (old)  . CORONARY ARTERY BYPASS GRAFT  2008  . [place]  07/12/2011   Procedure: CYSTOSCOPY WITH RETROGRADE PYELOGRAM/URETERAL STENT PLACEMENT;  Surgeon: Alexis Frock, MD;  Location: [facility];  Service: Urology;  Laterality: Left;  . ECHO DOPPLER COMPLETE(TRANSTHOR) ([facility] HX)  04/07/2017   LV EF: 25%-30%  . EXPLORATORY LAPAROTOMY    . HERNIA REPAIR    . ICD GENERATOR CHANGE  02/01/2014   Gen change to: Medtronic VIVA pulse generator, serial number [id number]  . INSERT / REPLACE / REMOVE PACEMAKER    . LEFT HEART CATHETERIZATION WITH CORONARY ANGIOGRAM N/A 08/23/2014   Procedure: LEFT HEART CATHETERIZATION WITH CORONARY ANGIOGRAM;  Surgeon: Jettie Booze, MD;  Location: [facility] CATH LAB;  Service: Cardiovascular;  Laterality: N/A;  . MITRAL VALVE REPLACEMENT     remote [facility] valve 5003 with redo tissue valve 06/2006  . PACEMAKER GENERATOR CHANGE  N/A 02/01/2014   Procedure: PACEMAKER GENERATOR CHANGE;  Surgeon: Deboraha Sprang, MD;  Location: [facility] CATH LAB;  Service: Cardiovascular;  Laterality: N/A;  . PACEMAKER PLACEMENT  2006   Changed to CRT-D in 2009  . RIGHT/LEFT HEART CATH AND CORONARY ANGIOGRAPHY N/A 06/09/2017   Procedure: RIGHT/LEFT HEART CATH AND CORONARY ANGIOGRAPHY;  Surgeon: Larey Dresser, MD;  Location: [place] CV LAB;  Service: Cardiovascular;  Laterality: N/A;  . TRANSTHORACIC ECHOCARDIOGRAM  10/2017   EF 30-35%, inf-lat and inf akin, MV bioprosth ok, mod dec RV fxn, pulm HTN (peak 67).  . VASECTOMY      Outpatient Medications Prior to Visit  Medication Sig Dispense Refill  . arformoterol (BROVANA) 15 MCG/2ML NEBU Take 2 mLs (15 mcg total) by nebulization 2 (two) times daily for 15 days. 120 mL 1  . atorvastatin (LIPITOR) 40 MG tablet Take 1 tablet (40 mg total) by mouth daily at 6 PM. 90 tablet 1  . blood glucose meter kit and supplies Use to check blood sugar twice daily 1 each 0  . budesonide (PULMICORT) 0.5 MG/2ML nebulizer solution Take 2 mLs (0.5 mg total) by nebulization 2 (two) times daily. DX: J44.9 120 mL 4  . carvedilol (COREG) 6.25 MG tablet Take 1 tablet by mouth 2 (two) times daily.    . digoxin (LANOXIN) 0.125 MG tablet Take 1 tablet (0.125 mg total) by mouth daily. 90 tablet 3  . ELIQUIS 5 MG TABS tablet Take 1 tablet (5 mg total) by mouth 2 (two) times daily. 180 tablet 3  . empagliflozin (JARDIANCE) 25 MG TABS tablet Take 25 mg by mouth daily. 30 tablet 0  . glipiZIDE (GLUCOTROL XL) 2.5 MG 24 hr tablet Take 2 tab every morning and 1 tab every evening    . ipratropium-albuterol (DUONEB) 0.5-2.5 (3) MG/3ML SOLN Take 3 mLs by nebulization every 4 (four) hours as needed. 360 mL 0  . lactulose (CHRONULAC) 10 GM/15ML solution TAKE 30 MILLILITERS BY MOUTH TWICE A DAY (Patient taking differently: TAKE 45 MILLILITERS BY MOUTH daily) 1800 mL 6  . loratadine (CLARITIN) 10 MG tablet Take 1 tablet (10 mg total)  by mouth daily. 30 tablet 1  . losartan (COZAAR) 25 MG tablet Take 1 tablet by mouth daily.    . Multiple Vitamin (MULTIVITAMIN WITH MINERALS) TABS tablet Take 1 tablet by mouth daily. 30 tablet 2  . nitroGLYCERIN (NITROSTAT) 0.4 MG SL tablet Place 1 tablet (0.4 mg total) under the tongue every 5 (five) minutes as needed for chest pain. 25 tablet 2  . OXYGEN Inhale 2.5 L into the lungs continuous.     Marland Kitchen thiamine 100 MG tablet Take 1 tablet (  100 mg total) by mouth daily. 30 tablet 0  . torsemide (DEMADEX) 20 MG tablet Take 4 tablets (80 mg total) by mouth daily. 120 tablet 6  . fluticasone (CUTIVATE) 0.05 % cream Apply topically 2 (two) times daily. 60 g 1  . spironolactone (ALDACTONE) 25 MG tablet Take 1 tablet (25 mg total) by mouth daily. 30 tablet 6   No facility-administered medications prior to visit.     Allergies  Allergen Reactions  . Warfarin Other (See Comments)    Non compliance and ETOH abuse    ROS As per HPI  PE: Blood pressure 105/61, pulse 64, temperature (!) 97.5 F (36.4 C), temperature source Oral, resp. rate 16, height _0  (1.651 m), weight 166 lb (75.3 kg), SpO2 92 %. Body mass index is 27.62 kg/m.  Gen: Alert, well appearing.  Patient is oriented to person, place, time, and situation. AFFECT: pleasant, lucid thought and speech. CV: RRR, no m/r/g.   LUNGS: CTA bilat, nonlabored resps, good aeration in all lung fields. EXT: 1+ pitting edema R LL, 2+ pitting L LL.  LABS:   Lab Results  Component Value Date   HGBA1C 9.2 (A) 07/19/2018      Chemistry      Component Value Date/Time   NA 136 07/07/2018 1011   NA 141 12/13/2017   K 4.7 07/07/2018 1011   CL 98 07/07/2018 1011   CO2 28 07/07/2018 1011   BUN 26 (H) 07/07/2018 1011   BUN 26 (A) 12/13/2017   CREATININE 1.51 (H) 07/07/2018 1011   GLU 220 12/13/2017      Component Value Date/Time   CALCIUM 10.2 07/07/2018 1011   ALKPHOS 119 11/09/2017   AST 37 11/09/2017   ALT 49 (A) 11/09/2017    BILITOT 1.1 10/29/2017 0426     Lab Results  Component Value Date   WBC 13.7 (H) 06/10/2018   HGB 12.4 (L) 06/10/2018   HCT 40.2 06/10/2018   MCV 92.8 06/10/2018   PLT 218 06/10/2018    IMPRESSION AND PLAN:  1) DM 2, glucoses improving since increase in jardiance to 70m qd. Continue this and glipizide xl 558mqAM and 2.44m38m evening. Check gluc fasting qd and 2 H PP a few days a week and we'll review these numbers in 1 mo. If avg fasting not down to 120s or better, will add low dose lantus or levemir.  2) CHF: wt gain several pounds in last few days. Give extra dose torsemide today, extra dose tomorrow if no wt loss. If extra dose given x 2 consecutive days and no wt loss OR if SOB worsens over baseline then he needs to return for recheck.  BMET today.  An After Visit Summary was printed and given to the patient.  FOLLOW UP: Return in about 4 weeks (around 08/29/2018) for routine chronic illness f/u (30 min).  Signed:  PhiCrissie SicklesD           08/01/2018

## 2018-08-02 ENCOUNTER — Ambulatory Visit: Payer: Medicare Other

## 2018-08-02 VITALS — BP 110/56

## 2018-08-02 DIAGNOSIS — F03B0 Unspecified dementia, moderate, without behavioral disturbance, psychotic disturbance, mood disturbance, and anxiety: Secondary | ICD-10-CM

## 2018-08-02 DIAGNOSIS — F0390 Unspecified dementia without behavioral disturbance: Secondary | ICD-10-CM

## 2018-08-02 NOTE — Progress Notes (Signed)
Patient comes to the office today with a history of dementia. Any patient concerns?   Getting into stuff in the kitchen, roaming at night.  In and out of the house at night.  Forgetting names of family members   Functional Questionnaire:  Independent - I Dependent - D    Activities of Daily Living (ADLs):     Personal hygiene - D Dressing- I Eating - D Maintaining continence -I Transferring - I   Independent Activities of Daily Living (iADLs): Basic communication skills - I Transportation - D Meal preparation - D Shopping - D Housework - D Managing medications - D Managing personal finances - I  We are planning to proceed with memory testing today.  Patient will undergo Montreal Cognitive Assessment (MoCA) by our psychometrician.    Results will be given to our provider for assessment and we will relay any treatment plans back to the patient. Confirmed with patient if condition begins to worsen call PCP or go to the ER.  Patient was given the office number and encouraged to call back with questions or concerns.

## 2018-08-02 NOTE — Progress Notes (Signed)
Opened in error

## 2018-08-03 ENCOUNTER — Ambulatory Visit: Payer: Self-pay | Admitting: Neurology

## 2018-08-03 IMAGING — DX DG CHEST 1V PORT
1 series · 1 of 1 positions shown · non-contrast
Comparison: CT chest of 10/21/2017 and chest x-ray of the same date

CLINICAL DATA: Evaluate endotracheal tube and OG tube placement

EXAM:
PORTABLE CHEST 1 VIEW

[chest ap]
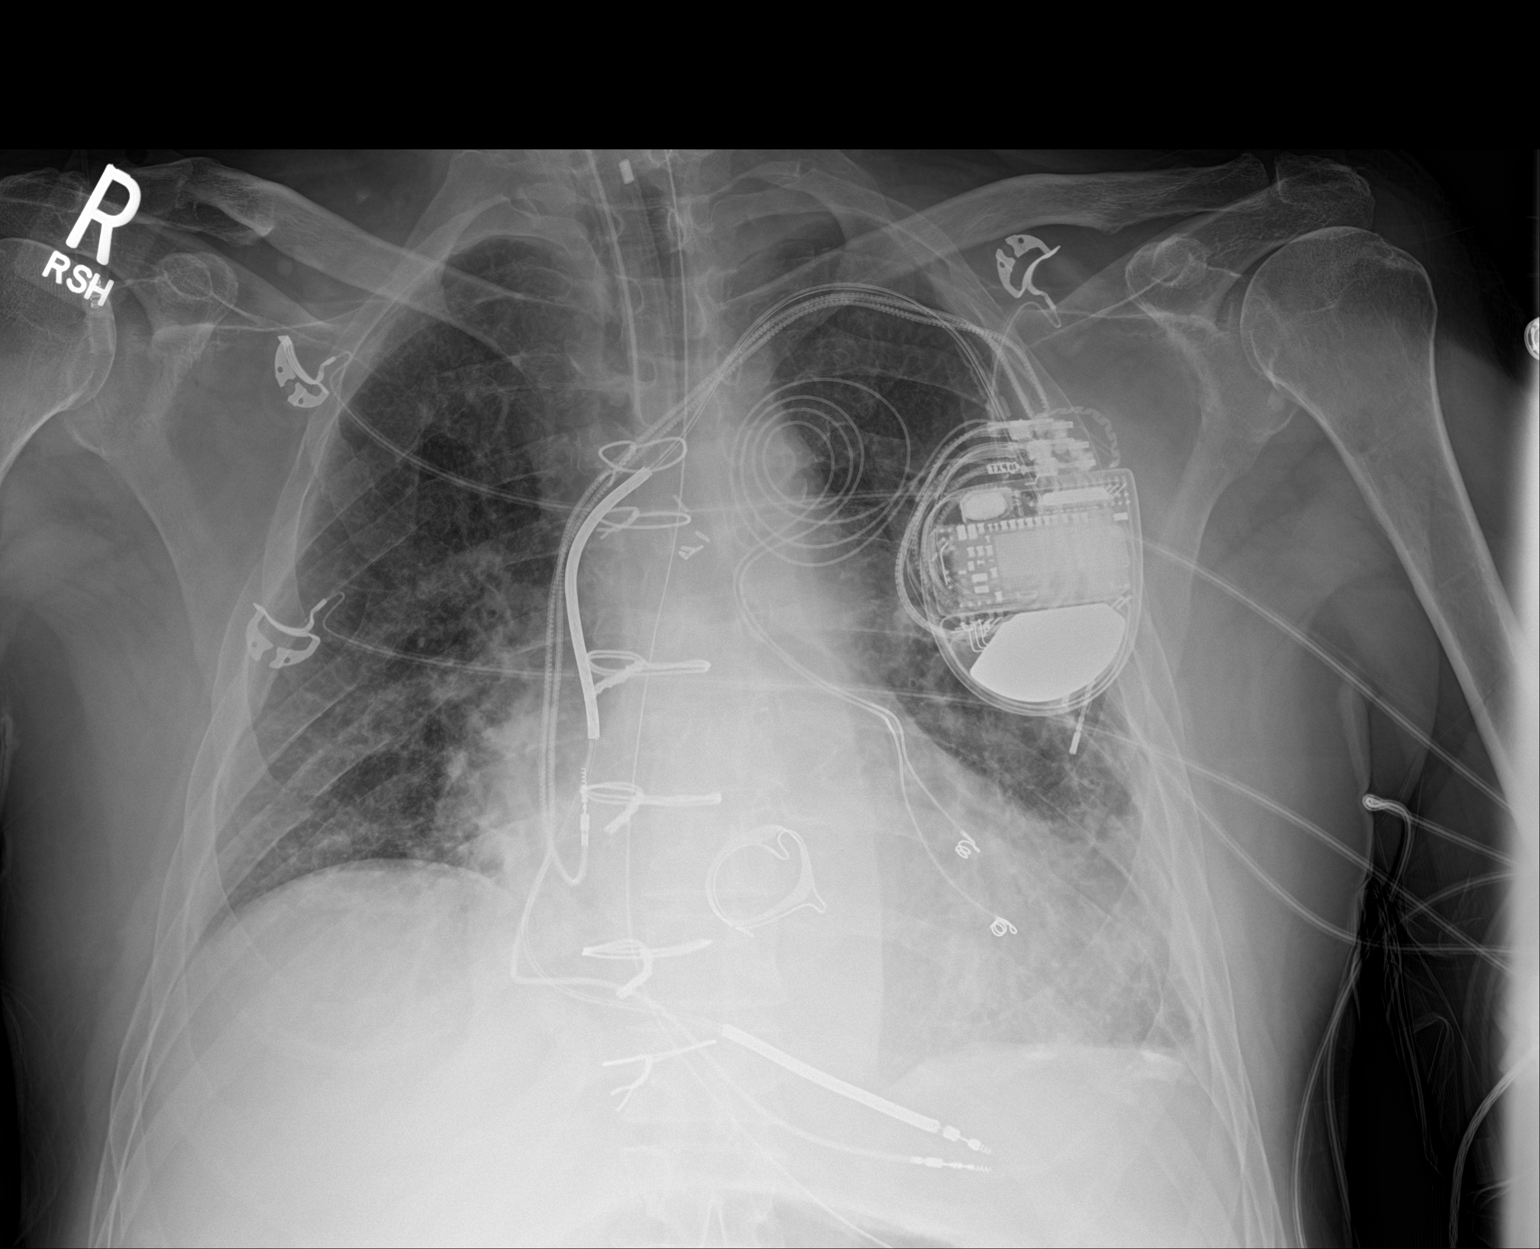

[1 of 1 positions shown; findings below may reference images not displayed]

FINDINGS: The tip of the endotracheal tube is approximately 4.1 cm above the
carina. The OG tube extends below the hemidiaphragm. There is little
change in cardiomegaly and pulmonary vascular congestion. AICD leads
remain.
IMPRESSION: 1. Tip of endotracheal tube 4.1 cm above the carina.
2. OG tube extends into the stomach.
3. Little change in cardiomegaly and pulmonary vascular congestion.

## 2018-08-03 IMAGING — DX DG CHEST 1V PORT
1 series · 1 of 1 positions shown · non-contrast
Comparison: 10/26/2017

CLINICAL DATA: Post right PICC line placement

EXAM:
PORTABLE CHEST 1 VIEW

[chest ap]
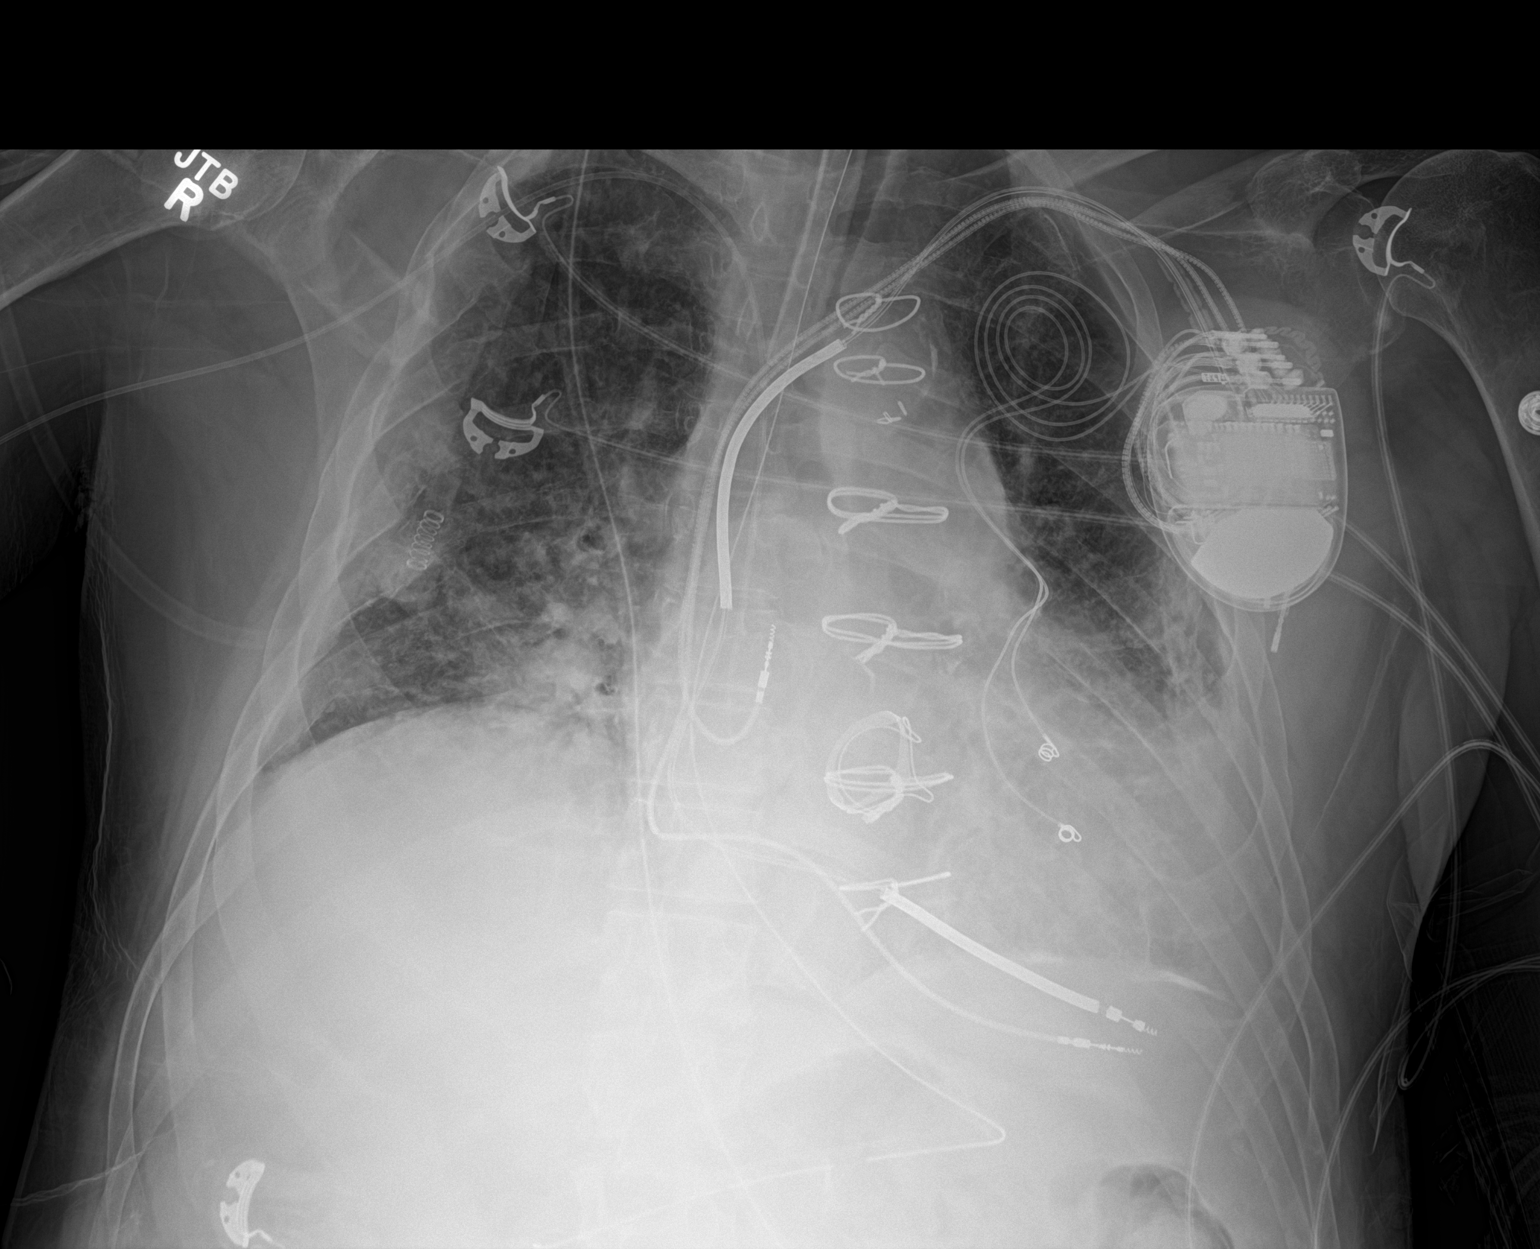

[1 of 1 positions shown; findings below may reference images not displayed]

FINDINGS: Right PICC line is in place. The tip is in the right atrium
approximately 4 cm below the cavoatrial junction. Left AICD remains
unchanged. Endotracheal tube and NG tube are unchanged.
Cardiomegaly. Bilateral perihilar and lower lobe opacities have
increased since prior study. Increasing small to moderate left
effusion. Calcified pleural plaques along the left hemidiaphragm.
IMPRESSION: Worsening perihilar and lower lobe airspace opacities, likely edema.

Small to moderate left effusion has increased since prior study.

Right PICC line tip is in the right atrium approximately 4 cm below
the cavoatrial junction.

## 2018-08-03 IMAGING — CT CT HEAD W/O CM
3 series · 15 of 47 positions shown, 18 images · non-contrast
Comparison: CT head dated July 19, 2013.

CLINICAL DATA: Altered mental status.

EXAM:
CT HEAD WITHOUT CONTRAST
TECHNIQUE: Contiguous axial images were obtained from the base of the skull
through the vertex without intravenous contrast.

[Series 3: head 5.0 h30s · axial · 0.42mm/px · z∈[-63,+67]mm · 9 of 32 slices shown, 12 images]
[im 3/32  brain]
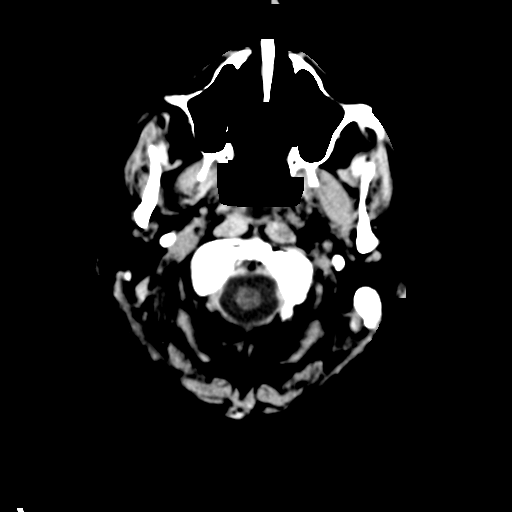
[im 3/32  bone]
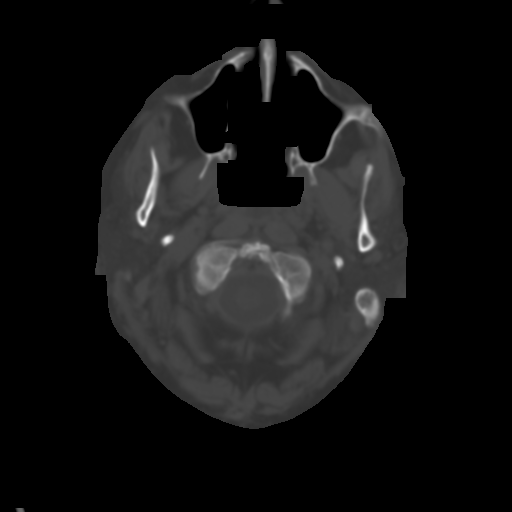
[im 6/32  brain]
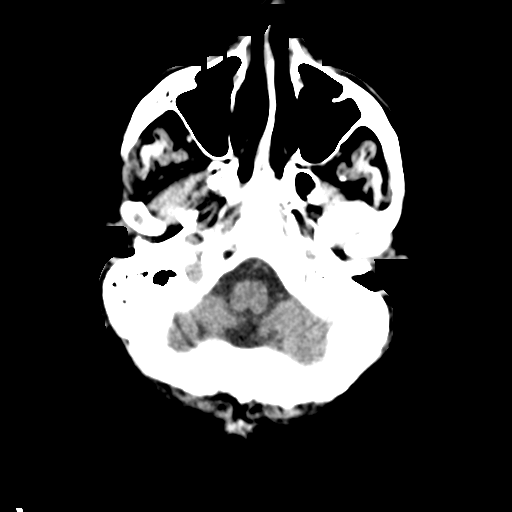
[im 9/32  brain]
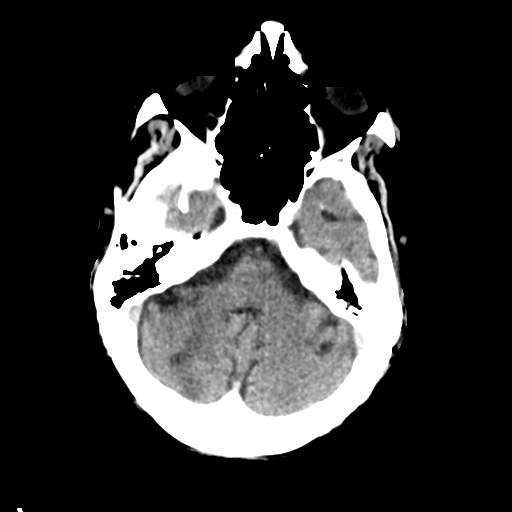
[im 12/32  brain]
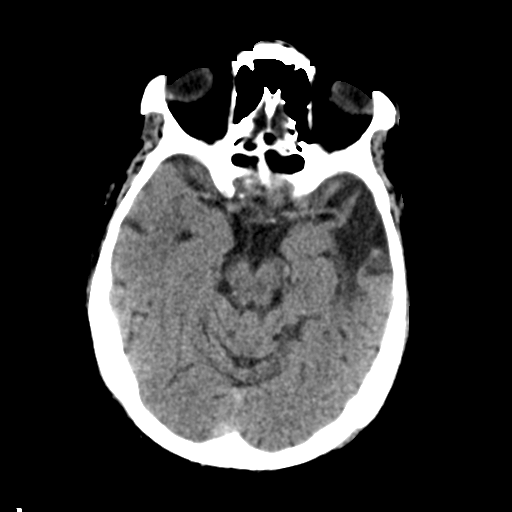
[im 17/32  brain]
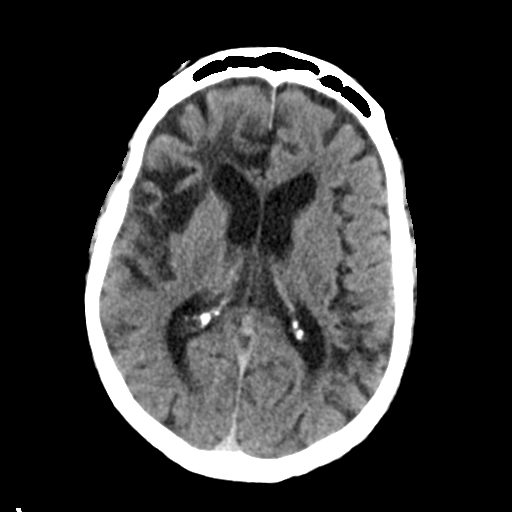
[im 17/32  bone]
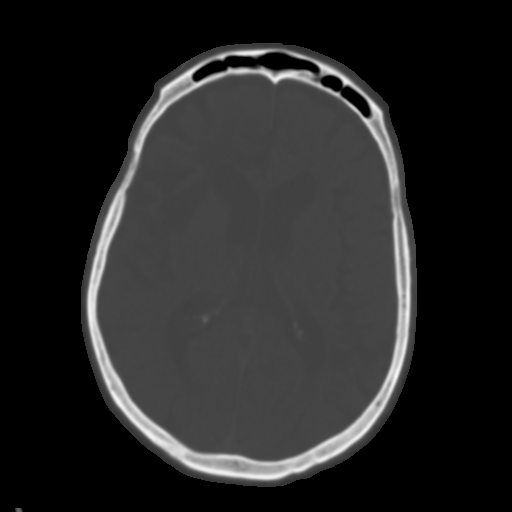
[im 20/32  brain]
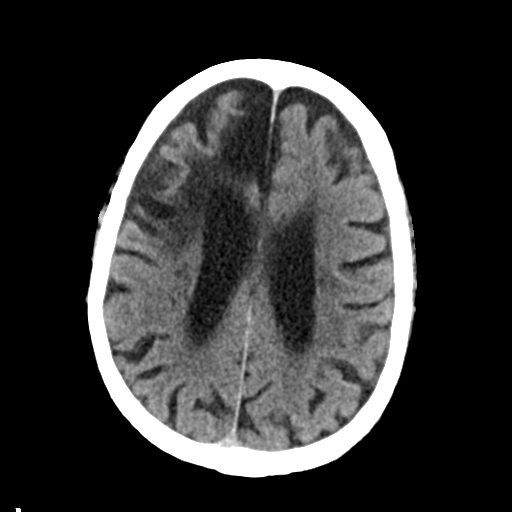
[im 23/32  brain]
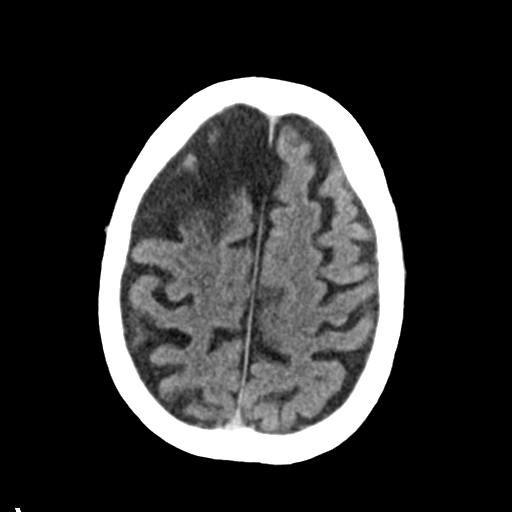
[im 26/32  brain]
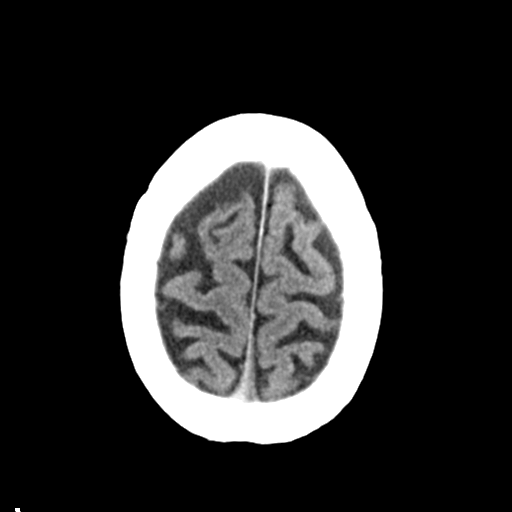
[im 29/32  brain]
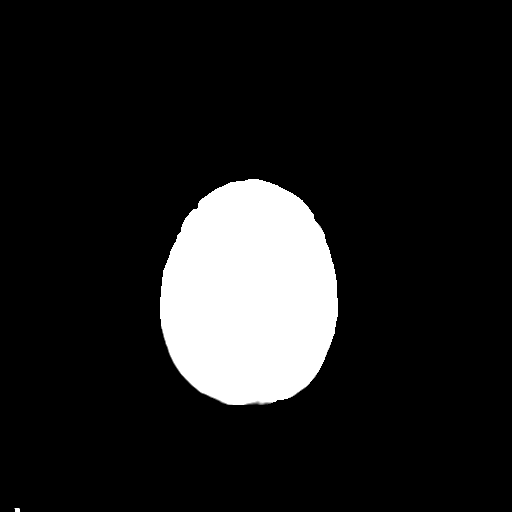
[im 29/32  bone]
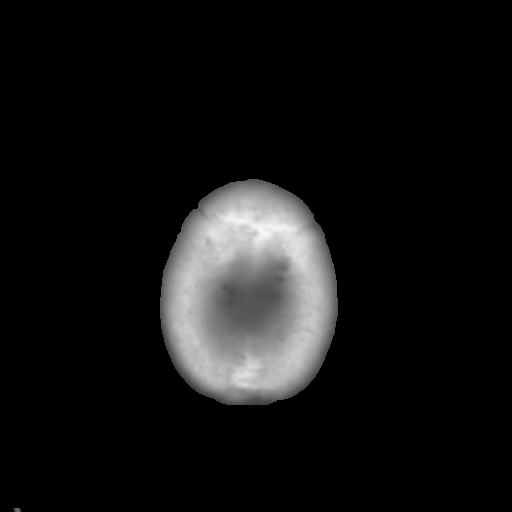

[Series 5: head 3.0 mpr cor · coronal · 0.31mm/px · 3 of 66 slices shown]
[im 22/66  brain]
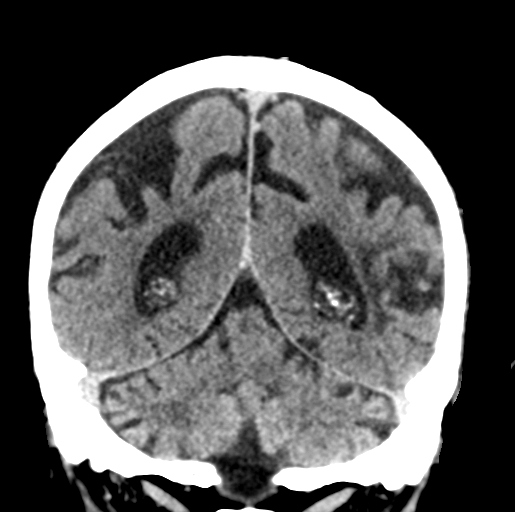
[im 29/66  brain]
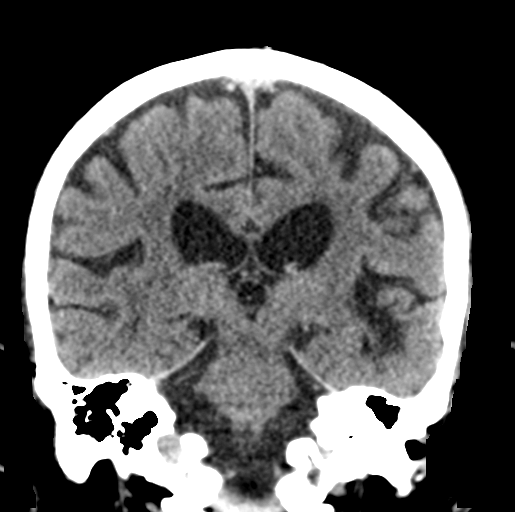
[im 37/66  brain]
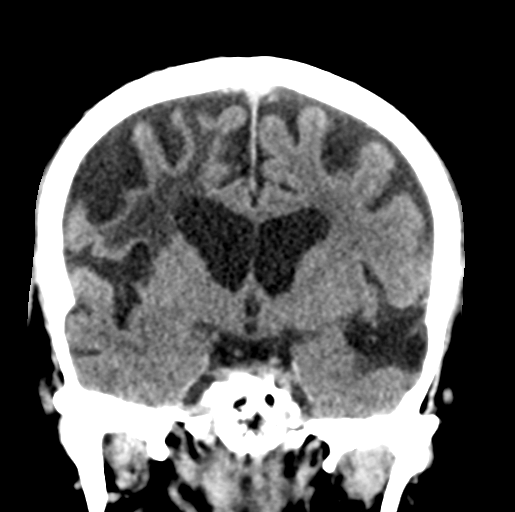

[Series 6: head 3.0 mpr sag · sagittal · 0.31mm/px · 3 of 51 slices shown]
[im 17/51  brain]
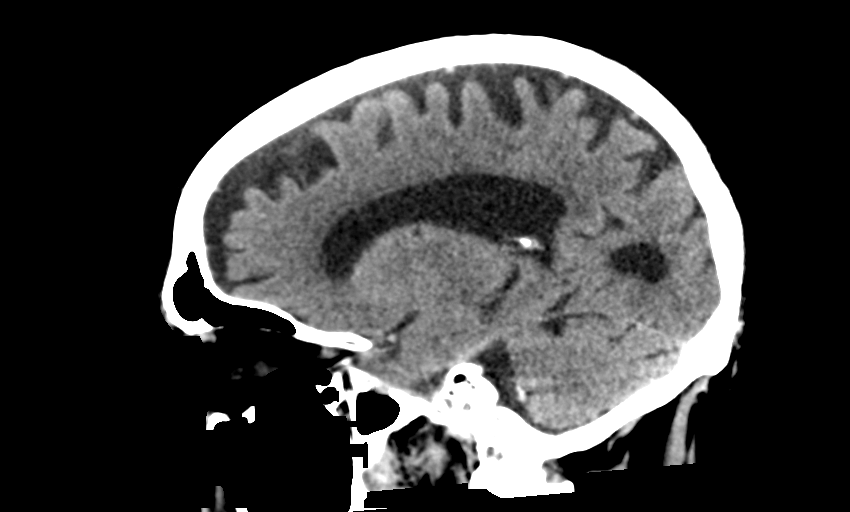
[im 26/51  brain]
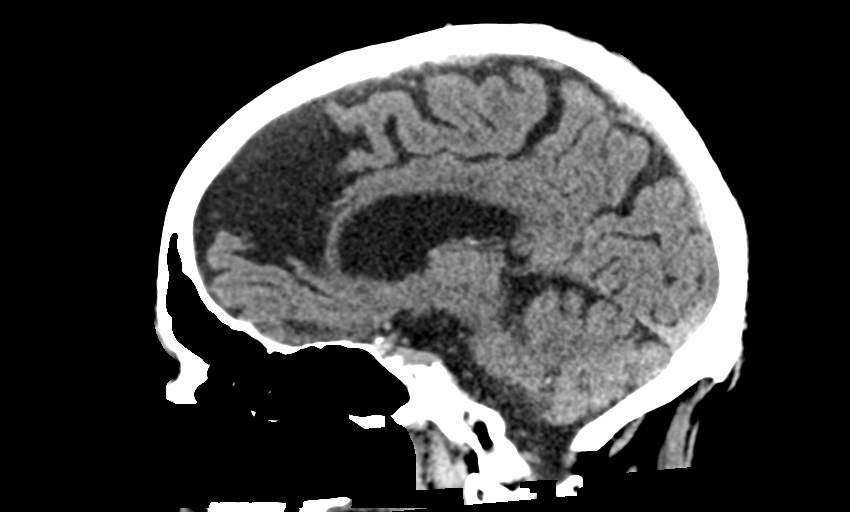
[im 34/51  brain]
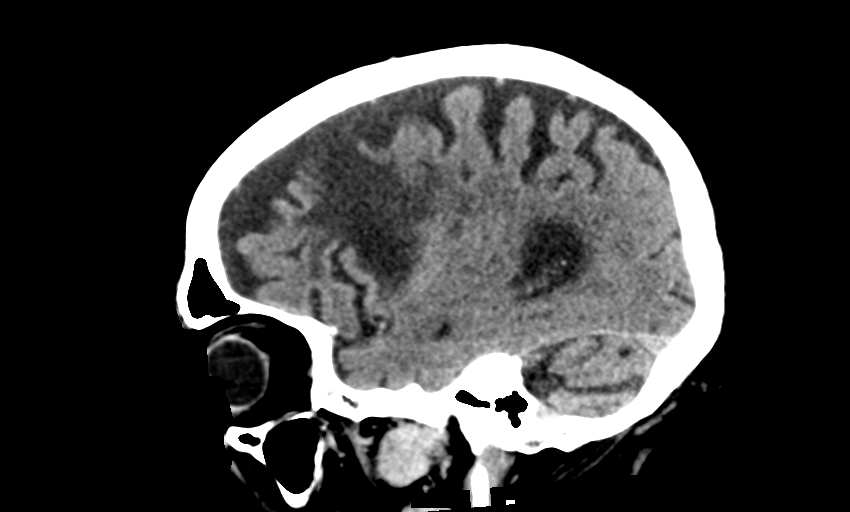

[15 of 47 positions shown; findings below may reference images not displayed]

FINDINGS: Brain: Multiple old infarcts involving the right frontal lobe, left
temporal and parietal lobes, right insula, right basal ganglia, and
bilateral cerebellar hemispheres, overall similar to prior study. No
evidence of acute infarction, hemorrhage, hydrocephalus, extra-axial
collection or mass lesion/mass effect. Stable atrophy and chronic
microvascular ischemic changes.

Vascular: Atherosclerotic vascular calcification of the carotid
siphons. No hyperdense vessel.

Skull: Negative for fracture or focal lesion.

Sinuses/Orbits: No acute finding. Minimal opacification of the right
mastoid air cells.

Other: None.
IMPRESSION: 1. No acute intracranial abnormality. Multiple old bilateral
cerebral and cerebellar infarcts are unchanged.

## 2018-08-05 IMAGING — DX DG CHEST 1V PORT
1 series · 1 of 1 positions shown · non-contrast
Comparison: Chest x-ray of 10/26/2017

CLINICAL DATA: Item endotracheal tube, acute respiratory failure

EXAM:
PORTABLE CHEST 1 VIEW

[chest]
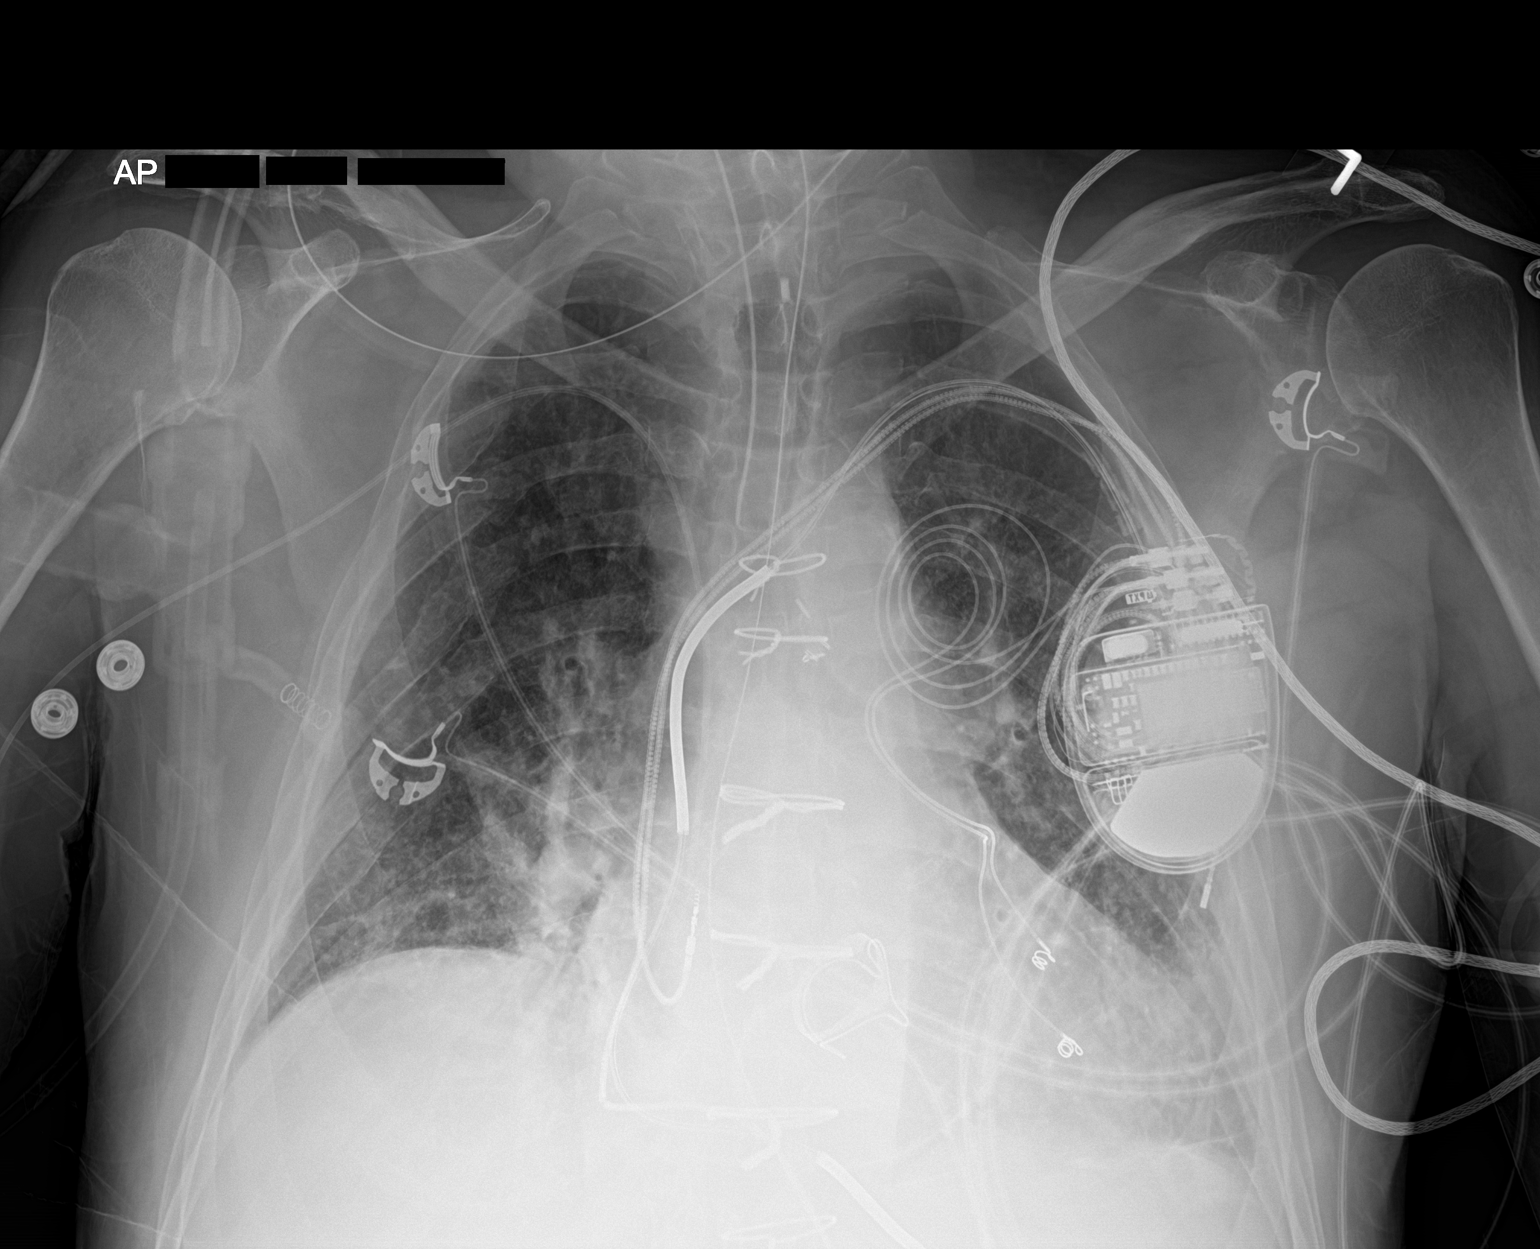

[1 of 1 positions shown; findings below may reference images not displayed]

FINDINGS: The tip of the endotracheal tube is approximately 3.6 cm above the
carina. Aeration has improved with decreasing basilar atelectasis.
Pacer and AICD leads remain and right central venous line tip
overlies the right atrium. Cardiomegaly is stable.
IMPRESSION: 1. Improved aeration with decreasing basilar atelectasis.
2. Endotracheal tube tip 3.6 cm above the carina.
3. Stable cardiomegaly with pacer and AICD leads.

## 2018-08-06 DIAGNOSIS — I5043 Acute on chronic combined systolic (congestive) and diastolic (congestive) heart failure: Secondary | ICD-10-CM | POA: Diagnosis not present

## 2018-08-06 DIAGNOSIS — J449 Chronic obstructive pulmonary disease, unspecified: Secondary | ICD-10-CM | POA: Diagnosis not present

## 2018-08-06 DIAGNOSIS — I259 Chronic ischemic heart disease, unspecified: Secondary | ICD-10-CM | POA: Diagnosis not present

## 2018-08-11 ENCOUNTER — Other Ambulatory Visit: Payer: Self-pay | Admitting: Internal Medicine

## 2018-08-11 ENCOUNTER — Other Ambulatory Visit (HOSPITAL_COMMUNITY): Payer: Self-pay | Admitting: Cardiology

## 2018-08-13 ENCOUNTER — Other Ambulatory Visit: Payer: Self-pay | Admitting: Family Medicine

## 2018-08-16 ENCOUNTER — Other Ambulatory Visit: Payer: Self-pay | Admitting: Family Medicine

## 2018-08-16 ENCOUNTER — Telehealth: Payer: Self-pay | Admitting: Neurology

## 2018-08-16 ENCOUNTER — Telehealth: Payer: Self-pay | Admitting: Family Medicine

## 2018-08-16 DIAGNOSIS — I5043 Acute on chronic combined systolic (congestive) and diastolic (congestive) heart failure: Secondary | ICD-10-CM | POA: Diagnosis not present

## 2018-08-16 DIAGNOSIS — S2239XA Fracture of one rib, unspecified side, initial encounter for closed fracture: Secondary | ICD-10-CM | POA: Diagnosis not present

## 2018-08-16 DIAGNOSIS — J9690 Respiratory failure, unspecified, unspecified whether with hypoxia or hypercapnia: Secondary | ICD-10-CM | POA: Diagnosis not present

## 2018-08-16 DIAGNOSIS — J449 Chronic obstructive pulmonary disease, unspecified: Secondary | ICD-10-CM | POA: Diagnosis not present

## 2018-08-16 NOTE — Telephone Encounter (Signed)
Copied from CRM [id number]. Topic: Quick Communication - Rx Refill/Question >> Aug 16, 2018  4:46 PM Angela Nevin wrote: Medication: Needles (Pts daughter unsure of specifics) for Insulin Glargine (LANTUS SOLOSTAR) 100 UNIT/ML Solostar Pen  Patient's daughter [name] requesting needles for pts insulin.    Has the patient contacted their pharmacy? Yes. Was advised to contact office.   Preferred Pharmacy (with phone number or street name):[phone] [place], [place] - [address] [phone] (Phone) [phone] (Fax)

## 2018-08-16 NOTE — Telephone Encounter (Deleted)
Daughter requesting a prescription for needles to go with the insulin pen.

## 2018-08-16 NOTE — Telephone Encounter (Signed)
Spoke to daughter [name] regarding recent nurse visit with note of memory decline MMSE 10/30 (15/30 on prior visit). Concerns raised included "getting into kitchen, roaming at night, in and out of house at night, forgetting names of family members." Discussed this with [name], she clarified that he does not wander out of the house, he would go out at 4am/5am and stand on her handicap ramp until the morning. She is getting frustrated that he does not do anything for himself. He tries to sleep all day and would only jump out of the bed when her husband comes because he would give him a cigarette. She and her daughter refuse to give him cigarettes. She states he does not do anything for himself such as prepare food. He only took a shower until 1.5 weeks. When we discussed behavioral changes that occur with dementia, she states that "this is not a change for him, he does not do things for himself, she has dealt with this situation with him all her life repeatedly, and it is not a change for her." She does not want him to be on any other medication, she got upset stating that she has told his doctors that she thinks he is on too much medications, which is what are causing his issues, he won't go places or interact with family because he is very tired. She states that her 73 year old's issues have been put on the back burner, we discussed dementia and behavioral changes that occur with dementia, as well as consideration for moving to assisted living, especially if it is affecting other family members. She states she can deal with it right now but if things change, she will contact our office.

## 2018-08-16 NOTE — Telephone Encounter (Signed)
Requesting needles for insulin pen.

## 2018-08-16 NOTE — Telephone Encounter (Signed)
Daughter requesting needles to go with the insulin pin. No prescription for pen needles found for this patient.

## 2018-08-17 ENCOUNTER — Encounter: Payer: Self-pay | Admitting: Family Medicine

## 2018-08-17 MED ORDER — INSULIN PEN NEEDLE 31G X 6 MM MISC: 5 refills | Status: DC

## 2018-08-17 NOTE — Telephone Encounter (Signed)
[facility]  [name] advised and voiced understanding.

## 2018-08-17 NOTE — Telephone Encounter (Signed)
Duplicate message. Rx already sent.

## 2018-08-19 IMAGING — DX DG CHEST 2V
2 series · 2 of 2 positions shown · non-contrast
Comparison: 10/28/2017

CLINICAL DATA: Follow-up pneumonia

EXAM:
CHEST - 2 VIEW

[chest pa]
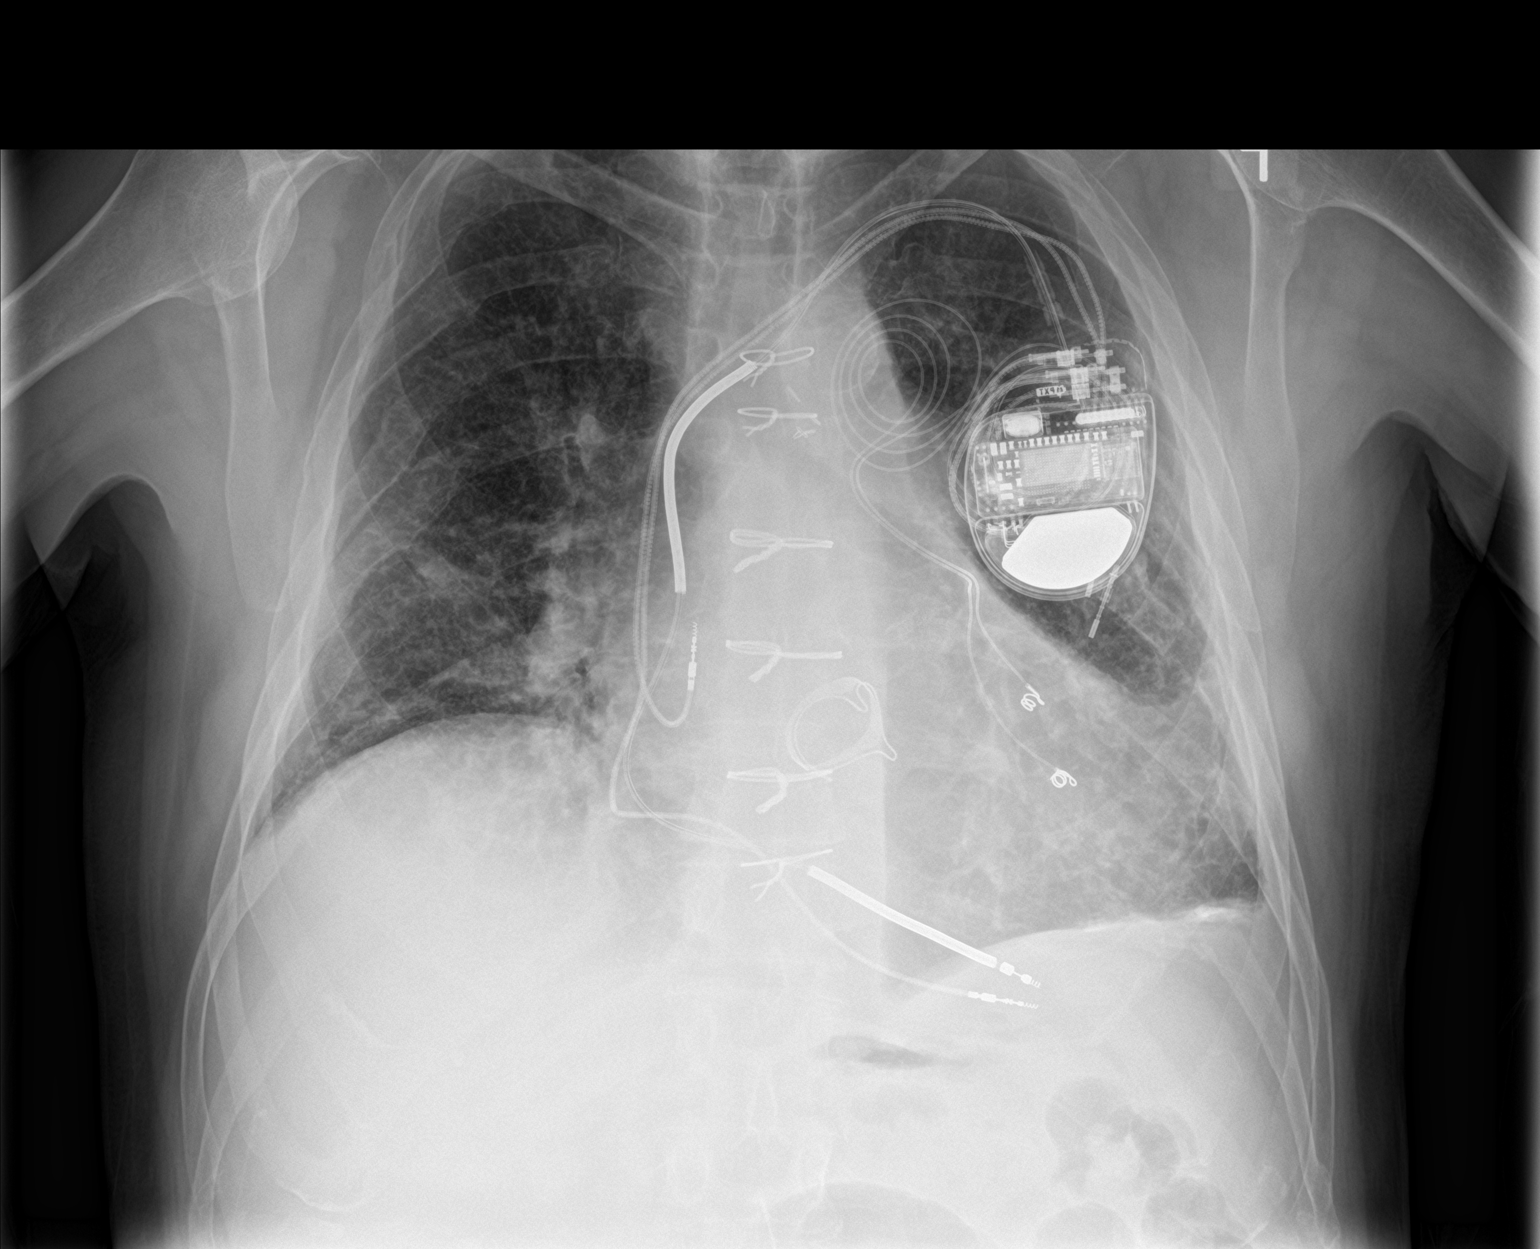

[chest lat]
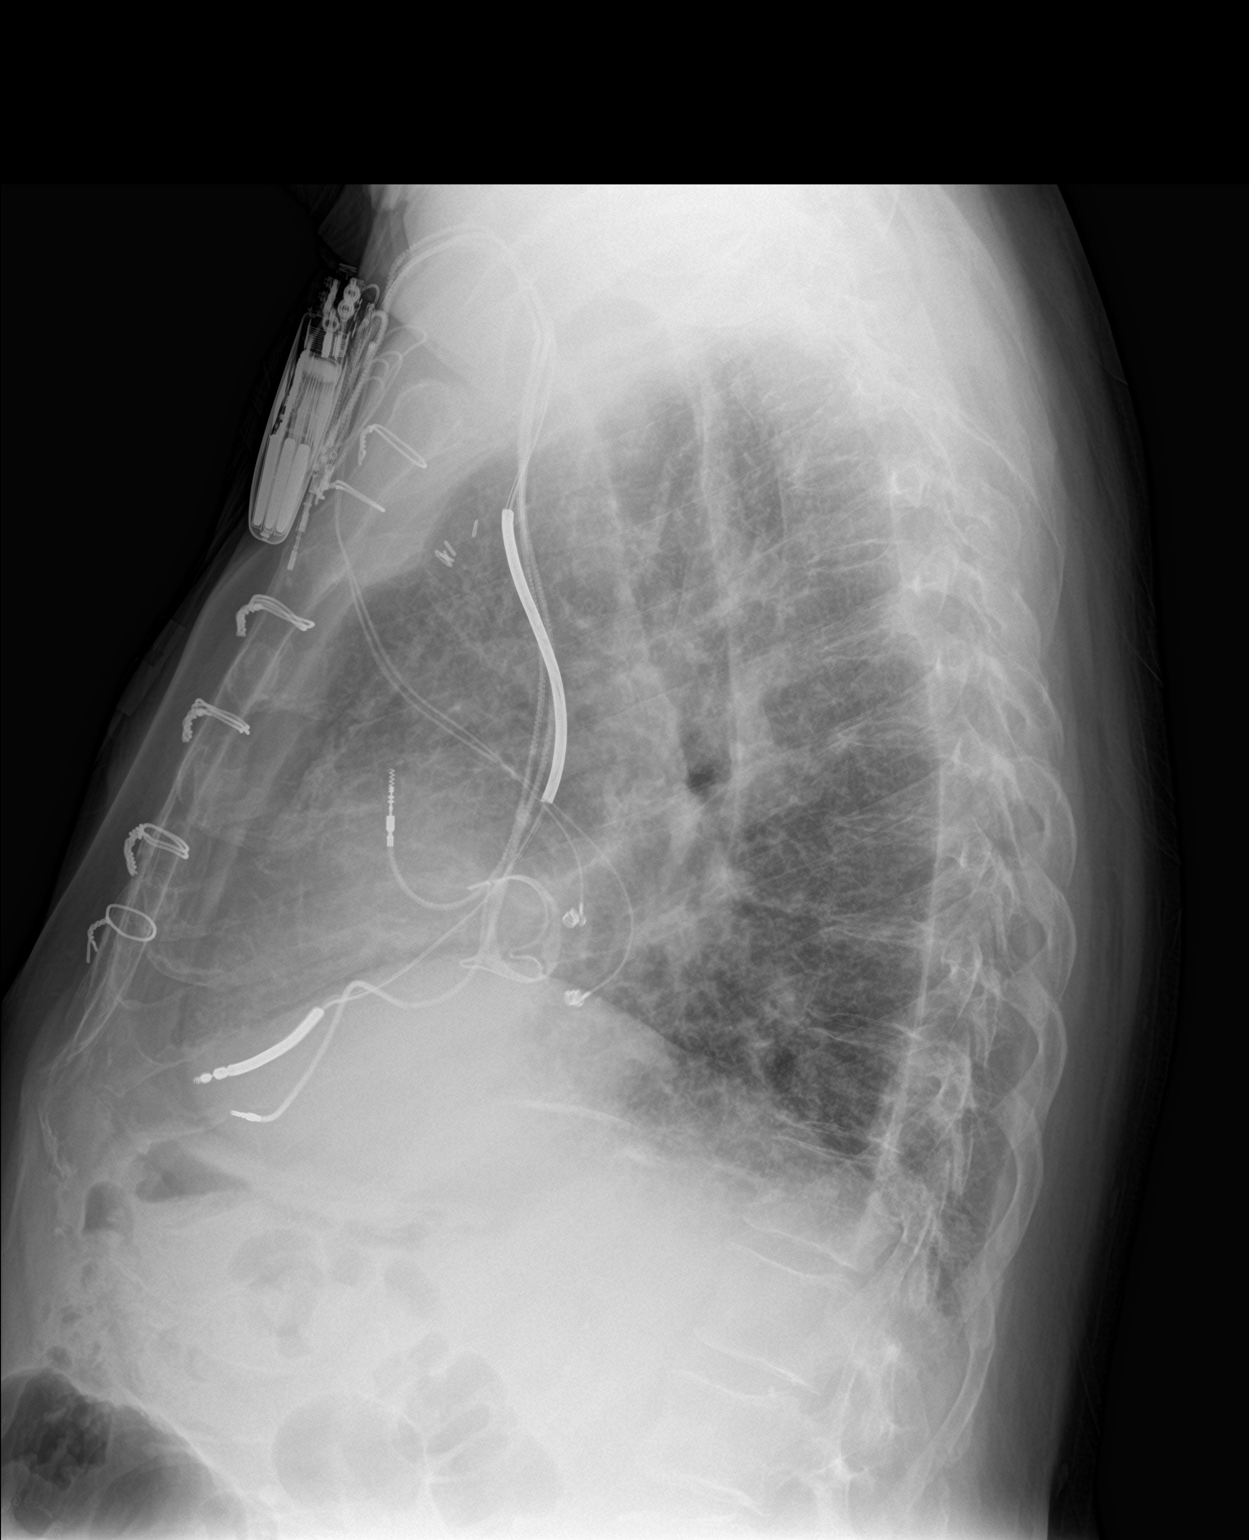

[2 of 2 positions shown; findings below may reference images not displayed]

FINDINGS: Cardiomegaly. Prior median sternotomy and valve replacement. Left
AICD remains in place, unchanged. Interval removal of endotracheal
tube and NG tube. Persistent linear bibasilar opacities, favor
scarring. Calcified pleural plaques noted at the left base with left
pleural thickening/scarring. No visible acute infiltrates or
effusions. No acute bony abnormality.
IMPRESSION: Persistent increased markings at the lung bases, favor
scarring/fibrosis.

Mild cardiomegaly.

## 2018-08-22 ENCOUNTER — Other Ambulatory Visit: Payer: Self-pay

## 2018-08-22 ENCOUNTER — Ambulatory Visit (INDEPENDENT_AMBULATORY_CARE_PROVIDER_SITE_OTHER): Payer: Medicare Other | Admitting: *Deleted

## 2018-08-22 DIAGNOSIS — I5022 Chronic systolic (congestive) heart failure: Secondary | ICD-10-CM

## 2018-08-22 DIAGNOSIS — S2239XA Fracture of one rib, unspecified side, initial encounter for closed fracture: Secondary | ICD-10-CM | POA: Diagnosis not present

## 2018-08-22 DIAGNOSIS — J9690 Respiratory failure, unspecified, unspecified whether with hypoxia or hypercapnia: Secondary | ICD-10-CM | POA: Diagnosis not present

## 2018-08-22 DIAGNOSIS — Z95810 Presence of automatic (implantable) cardiac defibrillator: Secondary | ICD-10-CM

## 2018-08-22 DIAGNOSIS — J449 Chronic obstructive pulmonary disease, unspecified: Secondary | ICD-10-CM | POA: Diagnosis not present

## 2018-08-22 DIAGNOSIS — I5043 Acute on chronic combined systolic (congestive) and diastolic (congestive) heart failure: Secondary | ICD-10-CM | POA: Diagnosis not present

## 2018-08-22 DIAGNOSIS — I255 Ischemic cardiomyopathy: Secondary | ICD-10-CM

## 2018-08-23 LAB — CUP PACEART REMOTE DEVICE CHECK
Battery Remaining Longevity: 22 mo
Battery Voltage: 2.93 V
Brady Statistic AP VP Percent: 75.68 %
Brady Statistic AP VS Percent: 0 %
Brady Statistic AS VP Percent: 24.08 %
Brady Statistic AS VS Percent: 0.25 %
Brady Statistic RA Percent Paced: 73.6 %
Brady Statistic RV Percent Paced: 94.71 %
HighPow Impedance: 44 Ohm
HighPow Impedance: 54 Ohm
Implantable Lead Implant Date: 20060307
Implantable Lead Implant Date: 20080110
Implantable Lead Implant Date: 20090617
Implantable Lead Location: 753858
Implantable Lead Location: 753859
Implantable Lead Location: 753860
Implantable Lead Model: 5071
Implantable Lead Model: 5076
Implantable Lead Model: 6947
Implantable Pulse Generator Implant Date: 20150827
Lead Channel Impedance Value: 399 Ohm
Lead Channel Impedance Value: 4047 Ohm
Lead Channel Impedance Value: 513 Ohm
Lead Channel Impedance Value: 589 Ohm
Lead Channel Pacing Threshold Amplitude: 0.625 V
Lead Channel Pacing Threshold Amplitude: 1.125 V
Lead Channel Pacing Threshold Pulse Width: 0.4 ms
Lead Channel Pacing Threshold Pulse Width: 0.4 ms
Lead Channel Pacing Threshold Pulse Width: 0.4 ms
Lead Channel Sensing Intrinsic Amplitude: 0.5 mV
Lead Channel Sensing Intrinsic Amplitude: 0.75 mV
Lead Channel Sensing Intrinsic Amplitude: 24.5 mV
Lead Channel Sensing Intrinsic Amplitude: 24.5 mV
Lead Channel Setting Pacing Amplitude: 2.5 V
Lead Channel Setting Pacing Pulse Width: 0.4 ms
Lead Channel Setting Pacing Pulse Width: 0.4 ms
Lead Channel Setting Sensing Sensitivity: 0.45 mV
MDC IDC MSMT LEADCHNL LV IMPEDANCE VALUE: 4047 Ohm
MDC IDC MSMT LEADCHNL RV IMPEDANCE VALUE: 589 Ohm
MDC IDC MSMT LEADCHNL RV PACING THRESHOLD AMPLITUDE: 0.625 V
MDC IDC SESS DTM: 20200316073324
MDC IDC SET LEADCHNL LV PACING AMPLITUDE: 2.5 V

## 2018-08-27 DIAGNOSIS — I259 Chronic ischemic heart disease, unspecified: Secondary | ICD-10-CM | POA: Diagnosis not present

## 2018-08-27 DIAGNOSIS — S2239XA Fracture of one rib, unspecified side, initial encounter for closed fracture: Secondary | ICD-10-CM | POA: Diagnosis not present

## 2018-08-27 DIAGNOSIS — J9690 Respiratory failure, unspecified, unspecified whether with hypoxia or hypercapnia: Secondary | ICD-10-CM | POA: Diagnosis not present

## 2018-08-27 DIAGNOSIS — J449 Chronic obstructive pulmonary disease, unspecified: Secondary | ICD-10-CM | POA: Diagnosis not present

## 2018-08-30 ENCOUNTER — Encounter: Payer: Self-pay | Admitting: Cardiology

## 2018-08-30 NOTE — Progress Notes (Signed)
Remote ICD transmission.   

## 2018-09-01 ENCOUNTER — Other Ambulatory Visit: Payer: Self-pay

## 2018-09-01 ENCOUNTER — Encounter: Payer: Self-pay | Admitting: Family Medicine

## 2018-09-01 ENCOUNTER — Ambulatory Visit (INDEPENDENT_AMBULATORY_CARE_PROVIDER_SITE_OTHER): Payer: Medicare Other | Admitting: Family Medicine

## 2018-09-01 ENCOUNTER — Telehealth: Payer: Self-pay

## 2018-09-01 ENCOUNTER — Other Ambulatory Visit: Payer: Self-pay | Admitting: Family Medicine

## 2018-09-01 VITALS — BP 114/54 | HR 63 | Wt 160.0 lb

## 2018-09-01 DIAGNOSIS — E118 Type 2 diabetes mellitus with unspecified complications: Secondary | ICD-10-CM | POA: Diagnosis not present

## 2018-09-01 NOTE — Progress Notes (Signed)
OFFICE VISIT  09/01/2018   CC:  Chief Complaint  Patient presents with  . Follow-up    RCI   HPI:    Patient is a 73 y.o.  male With whom I am doing a telephone visit today (due to COVID-19 pandemic restrictions). Prior to proceeding with our visit today, the telephone visit process was explained  and pt gave consent to proceed and understood that this process would be used to assess and treat the patient.  HPI:  Speaking with [name],pt's daughter, and speaking with pt. One month f/u DM 2. Last visit I added 10 U lantus qhs and continued him on glipizide xl 81m qAM and 2.570mqPM as well as jardiance 257md. He was instructed to check gluc fasting qd and 2 H PP a few days a week and we'll review these numbers in 1 mo. Taking insulin hs as rx'd, oral meds during daytime.  He actually has only had the lantus for about a week.  Fasting glucoses : "300+ yesterday". Today while on phone it was 191 fasting.  About 250s avg since getting on lantus.  No low glucoses. Caregivers feed him a decent low carb/low fat diet, but he is famous for stealing things left out. Overall they are pleased with the process since getting on lantus. He has not taken his diabetic pills yet today. Wt check today at home: 160 lbs--stable per home checks.  His wt was 166 lbs at lat o/v here 1 mo ago.  Ankles stable per daughter.  No pitting per daughter.  No recent bp checks. BP check while on phone today: note vitals below (very good).   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 AICD (automatic cardioverter/defibrillator) present   . Alcohol abuse   . Anxiety   . Bipolar disorder (HCCJane Lew  . Biventricular automatic implantable cardioverter defibrillator in situ    a. 01/2014 s/p MDT DTBA1D1 Viva XT CRT-D (ser # [id number].  . CAD (coronary artery disease)    a. s/p prior PCI/stenting of the LCX;  b. 08/2014 MV: EF 20%, large septal, apical, and inferior infarct from apex to base, no ischemia;  c. 08/2014 NSTEMI/Cath: LM mod distal dzs extending into ostial LCX (80%), LAD tortuous, RI nl, OM mod dzs, RCA 100 CTO with R->R and L->R collats-->Med Rx.  . CHF (congestive heart failure) (HCCTwin [place] . Chronic atrial fibrillation    a. CHA2DS2VASc = 6->eliquis;  b. S/P AVN RFCA an BiV ICD placement.  . Chronic renal insufficiency, stage 3 (moderate) (HCC) 2020   GFR low 40s (estimated baseline sCr 1.5)  . Chronic respiratory failure (HCCArdentown . Chronic systolic congestive heart failure (HCC[place]  a. 01/2014 Echo: EF 25-30%, mod conc LVH, mod dil LA.  . CMarland Kitchenmplete heart block (HCCFunny River  a. In setting of prior AV nodal ablation r/t afib-->BiV ICD (01/2014).  . COPD (chronic obstructive pulmonary disease) (HCCLeips[place] . CVA (cerebral vascular accident) (HCC[place]azy [place]  a. Multiple prior embolic strokes.  . Dementia (HCC[place] . Depression   . Diabetes mellitus (HCCWeigel[place] . DVT (deep venous thrombosis) (HCCOn[place] . Dyslipidemia   .  Hyperglycemia   . Hypertension   . Mitral valve disease    a. remote mitral replacement with Bjork Shiley valve;  b. 06/2006 Redo MVR with tissue valve.  . Mixed Ischemic and Nonischemic Cardiomyopathy    a. 8/.2015 Echo: Ef 25-30%;  b. 01/2014 s/p MDT MHDQ2I2 Pershing Proud XT CRT-D (ser # [id number]).  . On home oxygen therapy    "2L; 24/7" (65/16/2019)  . Psoriasis    "back of his head; elbows; finger of left hand" (06/07/2017)  . Pure hypercholesterolemia   . S/P AV nodal ablation   . Tobacco dependence    current as of 07/2018  . Ventral hernia     Past Surgical History:  Procedure Laterality Date  . AV NODE ABLATION  2007  . CARDIAC CATHETERIZATION  06/2006   Mild ostial L main stenosis, CFX  stent patent, RCA occluded (old)  . CORONARY ARTERY BYPASS GRAFT  2008  . CYSTOSCOPY W/ URETERAL STENT PLACEMENT  07/12/2011   Procedure: CYSTOSCOPY WITH RETROGRADE PYELOGRAM/URETERAL STENT PLACEMENT;  Surgeon: Alexis Frock, MD;  Location: [facility];  Service: Urology;  Laterality: Left;  . ECHO DOPPLER COMPLETE(TRANSTHOR) ([facility] HX)  04/07/2017   LV EF: 25%-30%  . EXPLORATORY LAPAROTOMY    . HERNIA REPAIR    . ICD GENERATOR CHANGE  02/01/2014   Gen change to: Medtronic VIVA pulse generator, serial number [id number]  . INSERT / REPLACE / REMOVE PACEMAKER    . LEFT HEART CATHETERIZATION WITH CORONARY ANGIOGRAM N/A 08/23/2014   Procedure: LEFT HEART CATHETERIZATION WITH CORONARY ANGIOGRAM;  Surgeon: Jettie Booze, MD;  Location: [facility] CATH LAB;  Service: Cardiovascular;  Laterality: N/A;  . MITRAL VALVE REPLACEMENT     remote [facility] valve 1448 with redo tissue valve 06/2006  . PACEMAKER GENERATOR CHANGE N/A 02/01/2014   Procedure: PACEMAKER GENERATOR CHANGE;  Surgeon: Deboraha Sprang, MD;  Location: [facility] CATH LAB;  Service: Cardiovascular;  Laterality: N/A;  . PACEMAKER PLACEMENT  2006   Changed to CRT-D in 2009  . RIGHT/LEFT HEART CATH AND CORONARY ANGIOGRAPHY N/A 06/09/2017   Procedure: RIGHT/LEFT HEART CATH AND CORONARY ANGIOGRAPHY;  Surgeon: Larey Dresser, MD;  Location: [facility] CV LAB;  Service: Cardiovascular;  Laterality: N/A;  . TRANSTHORACIC ECHOCARDIOGRAM  10/2017   EF 30-35%, inf-lat and inf akin, MV bioprosth ok, mod dec RV fxn, pulm HTN (peak 65).  . VASECTOMY      Outpatient Medications Prior to Visit  Medication Sig Dispense Refill  . ACCU-CHEK GUIDE test strip TEST TWICE A DAY 100 each 0  . arformoterol (BROVANA) 15 MCG/2ML NEBU Take 2 mLs (15 mcg total) by nebulization 2 (two) times daily for 15 days. 120 mL 1  . atorvastatin (LIPITOR) 40 MG tablet Take 1 tablet (40 mg total) by mouth daily at 6 PM. 90 tablet 1  . blood glucose meter kit and supplies Use to check  blood sugar twice daily 1 each 0  . budesonide (PULMICORT) 0.5 MG/2ML nebulizer solution Take 2 mLs (0.5 mg total) by nebulization 2 (two) times daily. DX: J44.9 120 mL 4  . carvedilol (COREG) 6.25 MG tablet Take 1 tablet by mouth 2 (two) times daily.    . digoxin (LANOXIN) 0.125 MG tablet Take 1 tablet (0.125 mg total) by mouth daily. 90 tablet 3  . ELIQUIS 5 MG TABS tablet Take 1 tablet (5 mg total) by mouth 2 (two) times daily. 180 tablet 3  . fluticasone (CUTIVATE) 0.05 % cream Apply topically 2 (two) times daily.  Dispense largest tube available please. 60 g 6  . glipiZIDE (GLUCOTROL XL) 2.5 MG 24 hr tablet Take 2 tab every morning and 1 tab every evening    . Insulin Glargine (LANTUS SOLOSTAR) 100 UNIT/ML Solostar Pen 10 units SQ qhs 5 pen 6  . Insulin Pen Needle 31G X 6 MM MISC Use to inject insulin at bedtime 100 each 5  . ipratropium-albuterol (DUONEB) 0.5-2.5 (3) MG/3ML SOLN Take 3 mLs by nebulization every 4 (four) hours as needed. 360 mL 0  . JARDIANCE 25 MG TABS tablet TAKE 1 TABLET BY MOUTH EVERY DAY 30 tablet 5  . lactulose (CHRONULAC) 10 GM/15ML solution TAKE 30 MILLILITERS BY MOUTH TWICE A DAY (Patient taking differently: TAKE 45 MILLILITERS BY MOUTH daily) 1800 mL 6  . loratadine (CLARITIN) 10 MG tablet Take 1 tablet (10 mg total) by mouth daily. 30 tablet 1  . losartan (COZAAR) 25 MG tablet Take 1 tablet (25 mg total) by mouth daily. 30 tablet 5  . Multiple Vitamin (MULTIVITAMIN WITH MINERALS) TABS tablet Take 1 tablet by mouth daily. 30 tablet 2  . nitroGLYCERIN (NITROSTAT) 0.4 MG SL tablet Place 1 tablet (0.4 mg total) under the tongue every 5 (five) minutes as needed for chest pain. 25 tablet 2  . OXYGEN Inhale 2.5 L into the lungs continuous.     Marland Kitchen thiamine 100 MG tablet Take 1 tablet (100 mg total) by mouth daily. 30 tablet 0  . torsemide (DEMADEX) 20 MG tablet Take 4 tablets (80 mg total) by mouth daily. 120 tablet 5  . spironolactone (ALDACTONE) 25 MG tablet Take 1 tablet  (25 mg total) by mouth daily. 30 tablet 6   No facility-administered medications prior to visit.     Allergies  Allergen Reactions  . Warfarin Other (See Comments)    Non compliance and ETOH abuse    ROS As per HPI  PE: Blood pressure (!) 114/54, pulse 63, weight 160 lb (72.6 kg). Pt did not participate in discussion today; daughter did all the speaking to him and to me.  LABS:  Lab Results  Component Value Date   HGBA1C 9.2 (A) 07/19/2018     Chemistry      Component Value Date/Time   NA 135 08/01/2018 0954   NA 141 12/13/2017   K 4.5 08/01/2018 0954   CL 94 (L) 08/01/2018 0954   CO2 30 08/01/2018 0954   BUN 47 (H) 08/01/2018 0954   BUN 26 (A) 12/13/2017   CREATININE 1.73 (H) 08/01/2018 0954   GLU 220 12/13/2017      Component Value Date/Time   CALCIUM 8.9 08/01/2018 0954   ALKPHOS 119 11/09/2017   AST 37 11/09/2017   ALT 49 (A) 11/09/2017   BILITOT 1.1 10/29/2017 0426       IMPRESSION AND PLAN:  1) DM 2: some improvements seen with starting lantus about 1 week ago. Plan is to increase lantus hs dose to 15 U starting tonight. Goal fasting gluc 110-120.  Increase lantus dose by 1 unit every evening if fasting glucose not in 110-120 range. Continue current oral diabetic meds. Add another glucose check 2 hours after his largest meal on a couple days a week. Next A1c check will be after 10/17/2018. Last lytes 1 mo ago normal, sCr relatively stable. Daughter expressed understanding of instructions/plan and is in agreement.  An After Visit Summary was printed and given to the patient.  FOLLOW UP: Return for 1 mo telephone visit .  Signed:  Abbe Amsterdam  McGowen, MD           09/01/2018

## 2018-09-01 NOTE — Telephone Encounter (Signed)
Attempted to contact patient twice for virtual visit. No answer but left message to call.

## 2018-09-05 DIAGNOSIS — J449 Chronic obstructive pulmonary disease, unspecified: Secondary | ICD-10-CM | POA: Diagnosis not present

## 2018-09-05 DIAGNOSIS — J9690 Respiratory failure, unspecified, unspecified whether with hypoxia or hypercapnia: Secondary | ICD-10-CM | POA: Diagnosis not present

## 2018-09-05 DIAGNOSIS — I5043 Acute on chronic combined systolic (congestive) and diastolic (congestive) heart failure: Secondary | ICD-10-CM | POA: Diagnosis not present

## 2018-09-05 DIAGNOSIS — S2239XA Fracture of one rib, unspecified side, initial encounter for closed fracture: Secondary | ICD-10-CM | POA: Diagnosis not present

## 2018-09-16 DIAGNOSIS — J9690 Respiratory failure, unspecified, unspecified whether with hypoxia or hypercapnia: Secondary | ICD-10-CM | POA: Diagnosis not present

## 2018-09-16 DIAGNOSIS — S2239XA Fracture of one rib, unspecified side, initial encounter for closed fracture: Secondary | ICD-10-CM | POA: Diagnosis not present

## 2018-09-16 DIAGNOSIS — I5043 Acute on chronic combined systolic (congestive) and diastolic (congestive) heart failure: Secondary | ICD-10-CM | POA: Diagnosis not present

## 2018-09-16 DIAGNOSIS — J449 Chronic obstructive pulmonary disease, unspecified: Secondary | ICD-10-CM | POA: Diagnosis not present

## 2018-09-20 IMAGING — DX DG CHEST 2V
2 series · 2 of 2 positions shown · non-contrast
Comparison: Radiographs November 11, 2017.

CLINICAL DATA: Chronic respiratory failure with hypoxia.

EXAM:
CHEST - 2 VIEW

[chest pa]
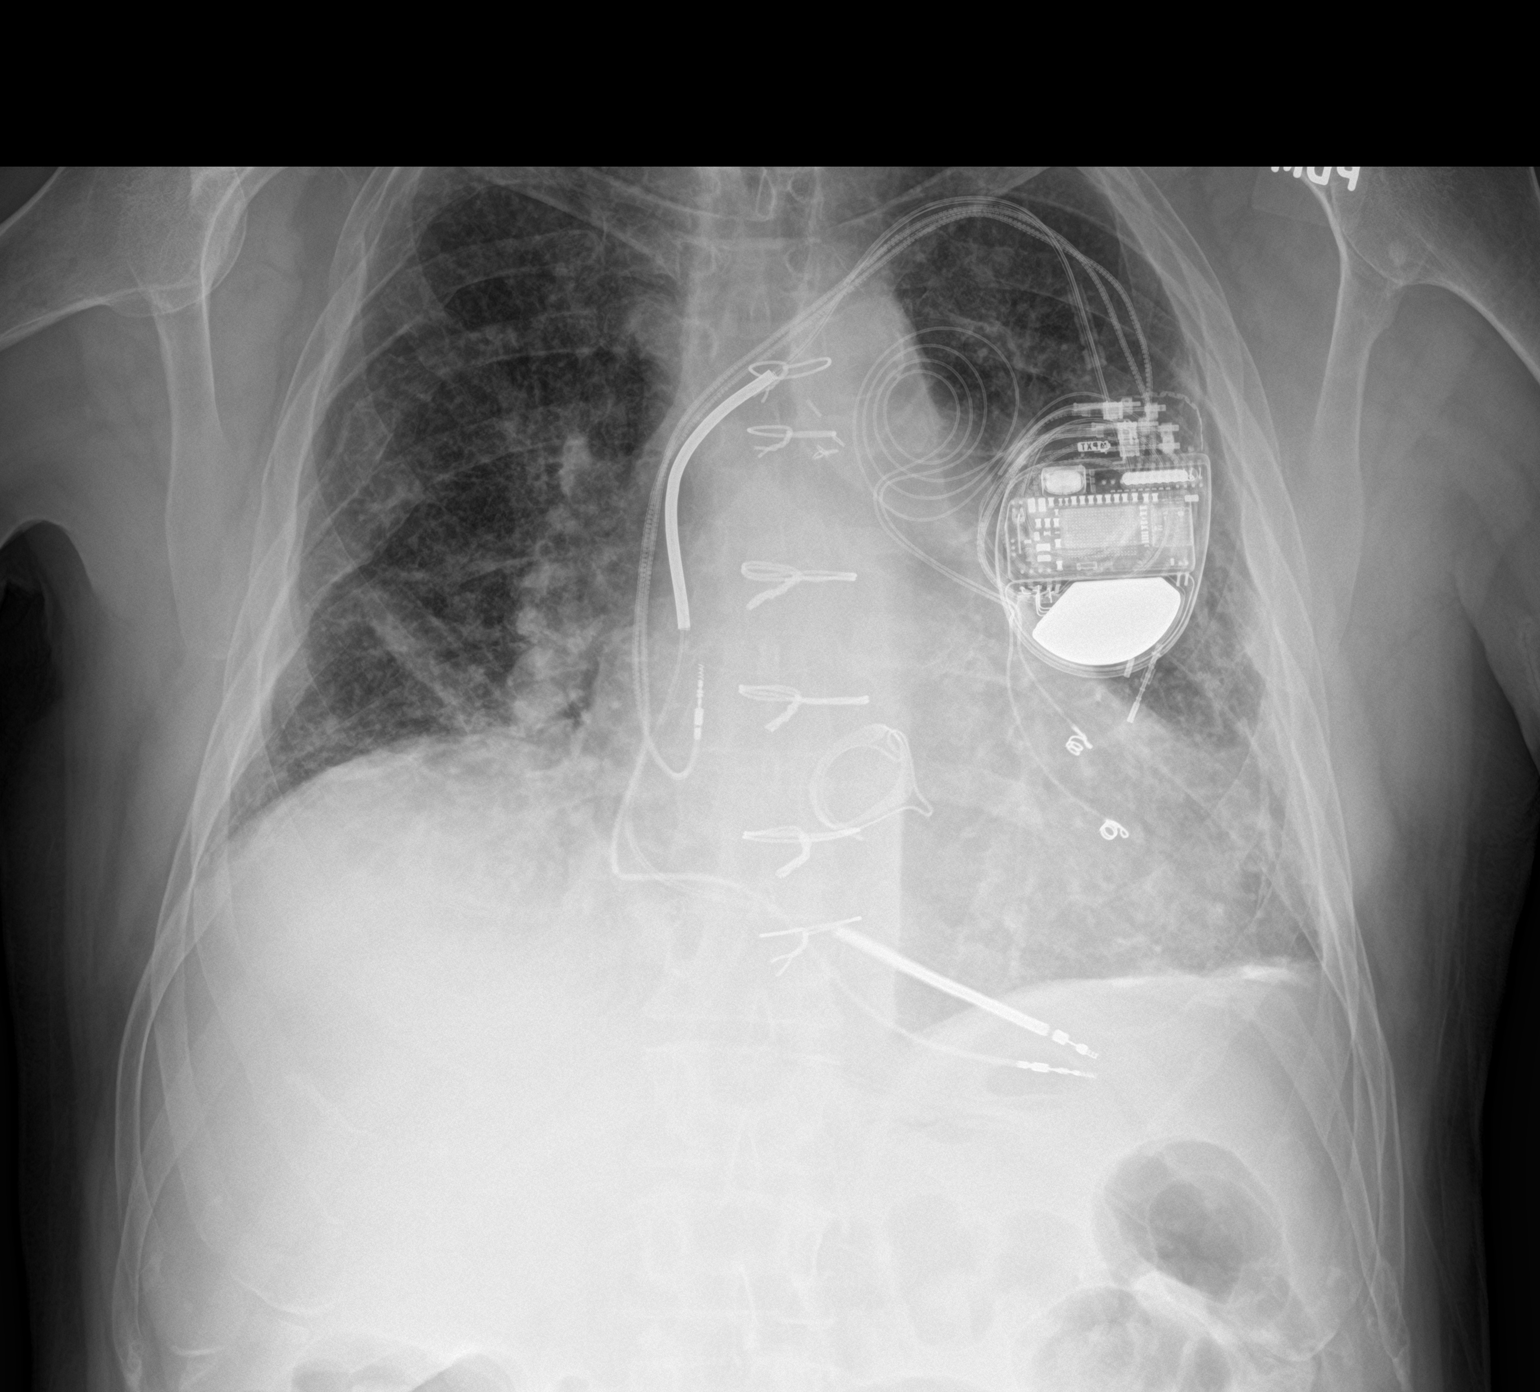

[chest lat]
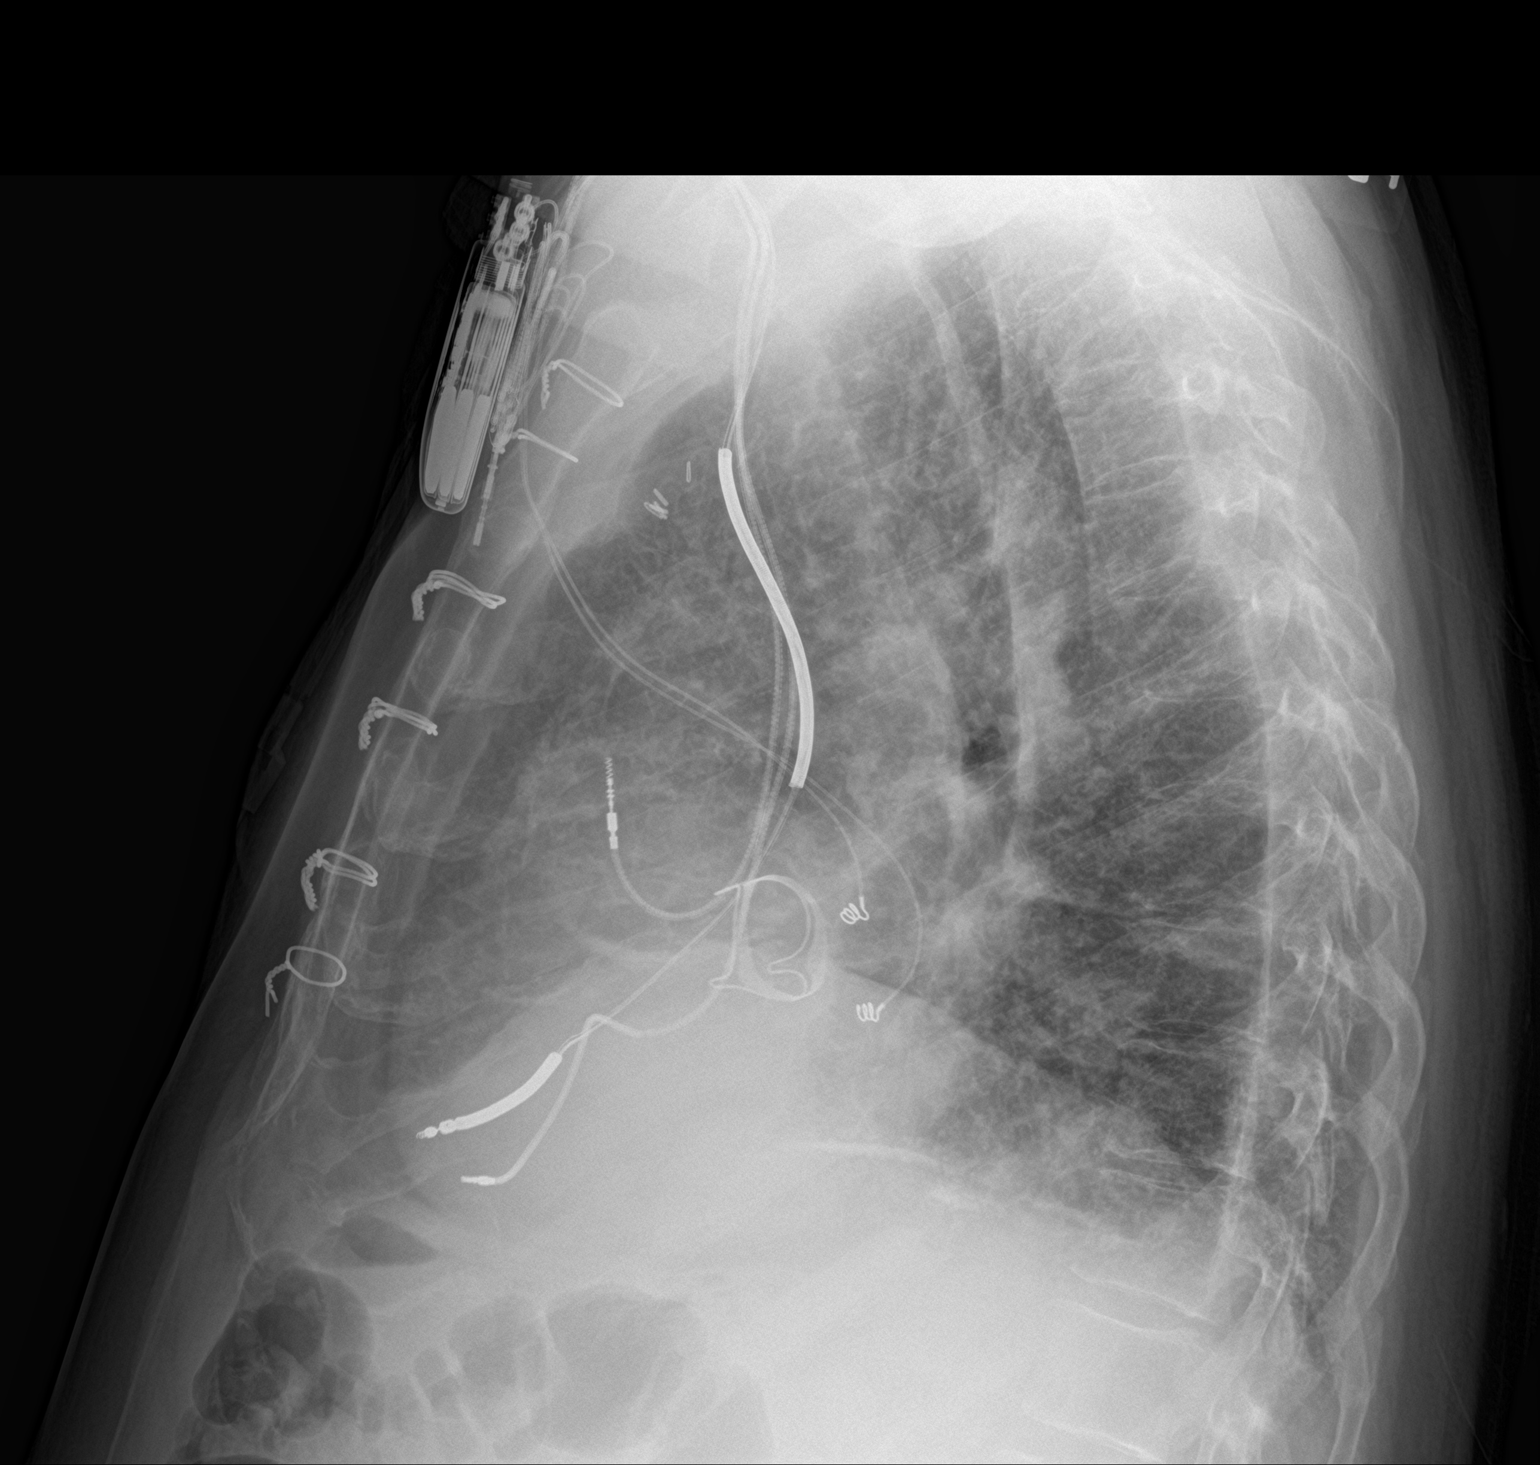

[2 of 2 positions shown; findings below may reference images not displayed]

FINDINGS: Stable cardiomegaly. Status post cardiac valve repair. Left-sided
pacemaker is unchanged in position. No pneumothorax or significant
pleural effusion is noted. Stable calcified pleural plaques are seen
in left lung base. Stable bibasilar densities are noted concerning
for scarring. Old right rib fractures are noted.
IMPRESSION: Stable bibasilar densities are noted consistent with scarring. No
significant change compared to prior exam.

## 2018-09-22 DIAGNOSIS — J449 Chronic obstructive pulmonary disease, unspecified: Secondary | ICD-10-CM | POA: Diagnosis not present

## 2018-09-22 DIAGNOSIS — S2239XA Fracture of one rib, unspecified side, initial encounter for closed fracture: Secondary | ICD-10-CM | POA: Diagnosis not present

## 2018-09-22 DIAGNOSIS — J9690 Respiratory failure, unspecified, unspecified whether with hypoxia or hypercapnia: Secondary | ICD-10-CM | POA: Diagnosis not present

## 2018-09-22 DIAGNOSIS — I5043 Acute on chronic combined systolic (congestive) and diastolic (congestive) heart failure: Secondary | ICD-10-CM | POA: Diagnosis not present

## 2018-09-27 ENCOUNTER — Ambulatory Visit: Payer: Self-pay | Admitting: Internal Medicine

## 2018-09-27 DIAGNOSIS — S2239XA Fracture of one rib, unspecified side, initial encounter for closed fracture: Secondary | ICD-10-CM | POA: Diagnosis not present

## 2018-09-27 DIAGNOSIS — I259 Chronic ischemic heart disease, unspecified: Secondary | ICD-10-CM | POA: Diagnosis not present

## 2018-09-27 DIAGNOSIS — J449 Chronic obstructive pulmonary disease, unspecified: Secondary | ICD-10-CM | POA: Diagnosis not present

## 2018-09-27 DIAGNOSIS — J9690 Respiratory failure, unspecified, unspecified whether with hypoxia or hypercapnia: Secondary | ICD-10-CM | POA: Diagnosis not present

## 2018-09-28 ENCOUNTER — Telehealth: Payer: Self-pay

## 2018-09-28 NOTE — Telephone Encounter (Signed)
SW pt's daughter, [name] and got 1 month f/u scheduled for this week.

## 2018-09-30 ENCOUNTER — Other Ambulatory Visit: Payer: Self-pay

## 2018-09-30 ENCOUNTER — Encounter: Payer: Self-pay | Admitting: Family Medicine

## 2018-09-30 ENCOUNTER — Ambulatory Visit (INDEPENDENT_AMBULATORY_CARE_PROVIDER_SITE_OTHER): Payer: Medicare Other | Admitting: Family Medicine

## 2018-09-30 VITALS — BP 133/63 | HR 67

## 2018-09-30 DIAGNOSIS — N183 Chronic kidney disease, stage 3 unspecified: Secondary | ICD-10-CM

## 2018-09-30 DIAGNOSIS — I5022 Chronic systolic (congestive) heart failure: Secondary | ICD-10-CM | POA: Diagnosis not present

## 2018-09-30 DIAGNOSIS — E118 Type 2 diabetes mellitus with unspecified complications: Secondary | ICD-10-CM

## 2018-09-30 DIAGNOSIS — E7220 Disorder of urea cycle metabolism, unspecified: Secondary | ICD-10-CM

## 2018-09-30 NOTE — Progress Notes (Signed)
Virtual Visit via Video Note  I connected with [name]  on 09/30/18 at  8:20 AM EDT by a video enabled telemedicine application and verified that I am speaking with the correct person using two identifiers.  Location patient: home Location provider:work office Persons participating in the virtual visit: patient, [name]'s daughter, provider  I discussed the limitations of evaluation and management by telemedicine and the availability of in person appointments. The patient expressed understanding and agreed to proceed.  Telemedicine visit is a necessity given the COVID-19 restrictions in place at the current time.  HPI: 73 y/o WM being seen today for 1 mo f/u DM. Last visit: increased lantus to 15 U with instructions to titrate up gradually until goal fasting gluc range 110-120 reached.  [name] to check gluc fasting qd and also a 2H PP a few days a week.  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  This is all in the setting CRI III.  Interim hx:  Doing very well.  Living with his daughter who is taking very good care of him. He is independent in toileting/bathing but needs assistance with food prep, bills/finanaces, and he is unable to drive.  No new complaints.  DM: insulin dose up to 12 at one point, but but they have stopped it. Glucoses down to 135 fasting.  Similar in afternoons. Seems to be feeling a lot better. BP's staying around 130s/70, HR 60s. Wt staying between 155 and 160 lbs the last month.  Today it is 157 lbs.  ROS: no CP, no SOB, no wheezing, no cough, no dizziness, no HAs, no rashes, no melena/hematochezia.  No polyuria or polydipsia.  No myalgias or arthralgias. No LE  swelling.   Still smoking but not a lot.  Past Medical History:  Diagnosis Date  . AICD (automatic cardioverter/defibrillator) present   . Alcohol abuse   . Anxiety   . Bipolar disorder ([place])   . Biventricular automatic implantable cardioverter defibrillator in situ    a. 01/2014 s/p MDT [id number] Viva XT CRT-D (ser # [id number]).  . CAD (coronary artery disease)    a. s/p prior PCI/stenting of the LCX;  b. 08/2014 MV: EF 20%, large septal, apical, and inferior infarct from apex to base, no ischemia;  c. 08/2014 NSTEMI/Cath: LM mod distal dzs extending into ostial LCX (80%), LAD tortuous, RI nl, OM mod dzs, RCA 100 CTO with R->R and L->R collats-->Med Rx.  . CHF (congestive heart failure) ([place])   . Chronic atrial fibrillation    a. CHA2DS2VASc = 6->eliquis;  b. S/P AVN RFCA an BiV ICD placement.  . Chronic renal insufficiency, stage 3 (moderate) (HCC) 2020   GFR low 40s (estimated baseline sCr 1.5)  . Chronic respiratory failure ([place])   . Chronic systolic congestive heart failure ([place])    a. 01/2014 Echo: EF 25-30%, mod conc LVH, mod dil LA.  Marland Kitchen Complete heart block ([place])    a. In setting of prior AV nodal ablation r/t afib-->BiV ICD (01/2014).  . COPD (chronic obstructive pulmonary disease) ([place])   . CVA (cerebral vascular accident) ([place])    a. Multiple prior embolic strokes.  . Dementia ([place])   . Depression   . Diabetes mellitus ([place])   . DVT (deep venous  thrombosis) ([place])   . Hypertension   . Mitral valve disease    a. remote mitral replacement with Bjork Shiley valve;  b. 06/2006 Redo MVR with tissue valve.  . Mixed Ischemic and Nonischemic Cardiomyopathy    a. 8/.2015 Echo: Ef 25-30%;  b. 01/2014 s/p MDT [id number] Pershing Proud XT CRT-D (ser # [id number]).  . On home oxygen therapy    "2L; 24/7" (65/16/2019)  . Psoriasis    "back of his head; elbows; finger of left hand" (06/07/2017)  . Pure hypercholesterolemia   . S/P AV nodal ablation   . Tobacco dependence    current as of 07/2018  . Ventral  hernia     Past Surgical History:  Procedure Laterality Date  . AV NODE ABLATION  2007  . CARDIAC CATHETERIZATION  06/2006   Mild ostial L main stenosis, CFX stent patent, RCA occluded (old)  . CORONARY ARTERY BYPASS GRAFT  2008  . CYSTOSCOPY W/ URETERAL STENT PLACEMENT  07/12/2011   Procedure: CYSTOSCOPY WITH RETROGRADE PYELOGRAM/URETERAL STENT PLACEMENT;  Surgeon: Alexis Frock, MD;  Location: [place];  Service: Urology;  Laterality: Left;  . ECHO DOPPLER COMPLETE(TRANSTHOR) ([facility] HX)  04/07/2017   LV EF: 25%-30%  . EXPLORATORY LAPAROTOMY    . HERNIA REPAIR    . ICD GENERATOR CHANGE  02/01/2014   Gen change to: Medtronic VIVA pulse generator, serial number [id number]  . INSERT / REPLACE / REMOVE PACEMAKER    . LEFT HEART CATHETERIZATION WITH CORONARY ANGIOGRAM N/A 08/23/2014   Procedure: LEFT HEART CATHETERIZATION WITH CORONARY ANGIOGRAM;  Surgeon: Jettie Booze, MD;  Location: [facility] CATH LAB;  Service: Cardiovascular;  Laterality: N/A;  . MITRAL VALVE REPLACEMENT     remote [facility] valve 6761 with redo tissue valve 06/2006  . PACEMAKER GENERATOR CHANGE N/A 02/01/2014   Procedure: PACEMAKER GENERATOR CHANGE;  Surgeon: Deboraha Sprang, MD;  Location[facility] C[facility] LAB;  Service: Cardiovascular;  Laterality: N/A;  . PACEMAKER PLACEMENT  2006   Changed to CRT-D in 2009  . RIGHT/LEFT HEART CATH AND CORONARY ANGIOGRAPHY N/A 06/09/2017   Procedure: RIGHT/LEFT HEART CATH AND CORONARY ANGIOGRAPHY;  Surgeon: Larey Dresser, MD;  Location: [facility] CV LAB;  Service: Cardiovascular;  Laterality: N/A;  . TRANSTHORACIC ECHOCARDIOGRAM  10/2017   EF 30-35%, inf-lat and inf akin, MV bioprosth ok, mod dec RV fxn, pulm HTN (peak 65).  Michelene Gardener      Family History  Problem Relation Age of Onset  . Alzheimer's disease Mother   . CAD Mother   . Heart attack Father   . Heart disease Father   . CAD Father   . Heart attack Son   . Varicose Veins Son   . Deep vein thrombosis Son   . Heart  disease Son   . Stroke Other   . Heart disease Other   . Heart disease Brother   . CAD Brother      Current Outpatient Medications:  .  ACCU-CHEK GUIDE test strip, TEST TWICE A DAY, Disp: 100 each, Rfl: 0 .  arformoterol (BROVANA) 15 MCG/2ML NEBU, Take 2 mLs (15 mcg total) by nebulization 2 (two) times daily for 15 days., Disp: 120 mL, Rfl: 1 .  atorvastatin (LIPITOR) 40 MG tablet, Take 1 tablet (40 mg total) by mouth daily at 6 PM., Disp: 90 tablet, Rfl: 1 .  blood glucose meter kit and supplies, Use to check blood sugar twice daily, Disp: 1 each, Rfl: 0 .  budesonide (PULMICORT) 0.5 MG/2ML nebulizer  solution, Take 2 mLs (0.5 mg total) by nebulization 2 (two) times daily. DX: J44.9, Disp: 120 mL, Rfl: 4 .  carvedilol (COREG) 6.25 MG tablet, Take 1 tablet by mouth 2 (two) times daily., Disp: , Rfl:  .  digoxin (LANOXIN) 0.125 MG tablet, Take 1 tablet (0.125 mg total) by mouth daily., Disp: 90 tablet, Rfl: 3 .  ELIQUIS 5 MG TABS tablet, Take 1 tablet (5 mg total) by mouth 2 (two) times daily., Disp: 180 tablet, Rfl: 3 .  fluticasone (CUTIVATE) 0.05 % cream, Apply topically 2 (two) times daily. Dispense largest tube available please., Disp: 60 g, Rfl: 6 .  glipiZIDE (GLUCOTROL XL) 2.5 MG 24 hr tablet, Take 2 tab every morning and 1 tab every evening, Disp: , Rfl:  .  Insulin Glargine (LANTUS SOLOSTAR) 100 UNIT/ML Solostar Pen, 10 units SQ qhs, Disp: 5 pen, Rfl: 6 .  Insulin Pen Needle 31G X 6 MM MISC, Use to inject insulin at bedtime, Disp: 100 each, Rfl: 5 .  ipratropium-albuterol (DUONEB) 0.5-2.5 (3) MG/3ML SOLN, Take 3 mLs by nebulization every 4 (four) hours as needed., Disp: 360 mL, Rfl: 0 .  JARDIANCE 25 MG TABS tablet, TAKE 1 TABLET BY MOUTH EVERY DAY, Disp: 30 tablet, Rfl: 5 .  lactulose (CHRONULAC) 10 GM/15ML solution, TAKE 30 MILLILITERS BY MOUTH TWICE A DAY (Patient taking differently: TAKE 45 MILLILITERS BY MOUTH daily), Disp: 1800 mL, Rfl: 6 .  loratadine (CLARITIN) 10 MG tablet,  Take 1 tablet (10 mg total) by mouth daily., Disp: 30 tablet, Rfl: 1 .  losartan (COZAAR) 25 MG tablet, Take 1 tablet (25 mg total) by mouth daily., Disp: 30 tablet, Rfl: 5 .  Multiple Vitamin (MULTIVITAMIN WITH MINERALS) TABS tablet, Take 1 tablet by mouth daily., Disp: 30 tablet, Rfl: 2 .  nitroGLYCERIN (NITROSTAT) 0.4 MG SL tablet, Place 1 tablet (0.4 mg total) under the tongue every 5 (five) minutes as needed for chest pain., Disp: 25 tablet, Rfl: 2 .  OXYGEN, Inhale 2.5 L into the lungs continuous. , Disp: , Rfl:  .  thiamine 100 MG tablet, Take 1 tablet (100 mg total) by mouth daily., Disp: 30 tablet, Rfl: 0 .  torsemide (DEMADEX) 20 MG tablet, Take 4 tablets (80 mg total) by mouth daily., Disp: 120 tablet, Rfl: 5 .  spironolactone (ALDACTONE) 25 MG tablet, Take 1 tablet (25 mg total) by mouth daily., Disp: 30 tablet, Rfl: 6  EXAM:  VITALS per patient if applicable:  GENERAL: alert, oriented, appears well and in no acute distress  HEENT: atraumatic, conjunttiva clear, no obvious abnormalities on inspection of external nose and ears  NECK: normal movements of the head and neck  LUNGS: on inspection no signs of respiratory distress, breathing rate appears normal, no obvious gross SOB, gasping or wheezing  CV: no obvious cyanosis  MS: moves all visible extremities without noticeable abnormality  PSYCH/NEURO: pleasant and cooperative, no obvious depression or anxiety, speech and thought processing grossly intact  LABS: none today    Chemistry      Component Value Date/Time   NA 135 08/01/2018 0954   NA 141 12/13/2017   K 4.5 08/01/2018 0954   CL 94 (L) 08/01/2018 0954   CO2 30 08/01/2018 0954   BUN 47 (H) 08/01/2018 0954   BUN 26 (A) 12/13/2017   CREATININE 1.73 (H) 08/01/2018 0954   GLU 220 12/13/2017      Component Value Date/Time   CALCIUM 8.9 08/01/2018 0954   ALKPHOS 119 11/09/2017  AST 37 11/09/2017   ALT 49 (A) 11/09/2017   BILITOT 1.1 10/29/2017 0426      Lab Results  Component Value Date   HGBA1C 9.2 (A) 07/19/2018    ASSESSMENT AND PLAN:  Discussed the following assessment and plan:  1) DM. Doing pretty good with just oral meds at this time. Discussed with daughter that he may still be ok to get a couple units of lantus every night. Next a1c check will be done after 10/17/18. Continue to check fasting gluc daily, 2H PP a few days a week.  2) CRI: he hydrates adequately, avoids NSAIDs. Last lytes/cr stable 2 mo ago.  3) Chronic systolic HF:  wt is stable. He has no DOE with the little activity that he does.  No peripheral edema. Continue current med regimen of carvedilol, dig, losartan, aldactone, and torsemide.  4) Hepatic steatosis (ETOH abuse, DM): some hx of increased ammonia levels, but no imaging evidence of cirrhosis on ultrasound or CT abd. Currently on lactulose. Will check serum ammonia at next f/u labs in 1 mo.  He is no longer drinking ETOH but still smoking some. He seems to be doing great/getting take of very well living with his daughter.   I discussed the assessment and treatment plan with the patient. The patient was provided an opportunity to ask questions and all were answered. The patient agreed with the plan and demonstrated an understanding of the instructions.   The patient was advised to call back or seek an in-person evaluation if the symptoms worsen or if the condition fails to improve as anticipated.  F/u: 1 mo, with labs just prior.  Signed:  Crissie Sickles, MD           09/30/2018

## 2018-10-05 ENCOUNTER — Ambulatory Visit (INDEPENDENT_AMBULATORY_CARE_PROVIDER_SITE_OTHER): Payer: Medicare Other | Admitting: Internal Medicine

## 2018-10-05 ENCOUNTER — Other Ambulatory Visit: Payer: Self-pay

## 2018-10-05 ENCOUNTER — Encounter: Payer: Self-pay | Admitting: Internal Medicine

## 2018-10-05 DIAGNOSIS — J449 Chronic obstructive pulmonary disease, unspecified: Secondary | ICD-10-CM | POA: Diagnosis not present

## 2018-10-05 DIAGNOSIS — J9612 Chronic respiratory failure with hypercapnia: Secondary | ICD-10-CM | POA: Diagnosis not present

## 2018-10-05 DIAGNOSIS — J9611 Chronic respiratory failure with hypoxia: Secondary | ICD-10-CM | POA: Diagnosis not present
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
## 2018-10-05 NOTE — Assessment & Plan Note (Signed)
HC03  06/13/17 = 39  - PSS 12/03/17 c/w no osa/ noct desats corrected on 2lpm s bipap - HC03 12/13/2017  = 40  - HC02  12/20/17   = 34  - HCO3 03/25/18 = 31  - 03/28/2018  Walked RA x 3 laps @ 185 ft each stopped due to  End of study, slow pace, no sob or desat  < 90% so rec 02 prn daytime  - 06/28/2018   Walked RA x 14  lap =  approx 173ft  - stopped due to sob and desat lower 80's titrate up to 4lpm and able to complete the lap  rec as of 10/05/2018 = 2lpm 24/7  But  4lpm when walking more than across the room

## 2018-10-05 NOTE — Assessment & Plan Note (Signed)
Active smoker Spirometry 10/11/15  Restrictive only but did not breath out the full 6 secs and does have mild curvature on F/V - bipap hs since d/c 11/03/17 for hypercabia (see separate a/p)  - PFT's  12/23/2017  FEV1 1.00 (40 % ) ratio 81  p no % improvement from saba p brovana  prior to study with DLCO  30 % corrects to 92  % for alv volume  / only mild curvature - Rehab to start late July 2019   - went x 1.5 months    Adequate control on present rx, reviewed in detail with pt > no change in rx needed    Each maintenance medication was reviewed in detail including most importantly the difference between maintenance and as needed and under what circumstances the prns are to be used.  Please see AVS for specific  Instructions which are unique to this visit and I personally typed out  which were reviewed in detail over the phone with his daughter Chrystal  and a copy provided.

## 2018-10-06 ENCOUNTER — Encounter: Payer: Self-pay | Admitting: Family Medicine

## 2018-10-10 ENCOUNTER — Other Ambulatory Visit (HOSPITAL_COMMUNITY): Payer: Self-pay | Admitting: Cardiology

## 2018-10-16 DIAGNOSIS — S2239XA Fracture of one rib, unspecified side, initial encounter for closed fracture: Secondary | ICD-10-CM | POA: Diagnosis not present

## 2018-10-16 DIAGNOSIS — J9690 Respiratory failure, unspecified, unspecified whether with hypoxia or hypercapnia: Secondary | ICD-10-CM | POA: Diagnosis not present

## 2018-10-16 DIAGNOSIS — I5043 Acute on chronic combined systolic (congestive) and diastolic (congestive) heart failure: Secondary | ICD-10-CM | POA: Diagnosis not present

## 2018-10-16 DIAGNOSIS — J449 Chronic obstructive pulmonary disease, unspecified: Secondary | ICD-10-CM | POA: Diagnosis not present

## 2018-10-22 DIAGNOSIS — S2239XA Fracture of one rib, unspecified side, initial encounter for closed fracture: Secondary | ICD-10-CM | POA: Diagnosis not present

## 2018-10-22 DIAGNOSIS — I5043 Acute on chronic combined systolic (congestive) and diastolic (congestive) heart failure: Secondary | ICD-10-CM | POA: Diagnosis not present

## 2018-10-22 DIAGNOSIS — J449 Chronic obstructive pulmonary disease, unspecified: Secondary | ICD-10-CM | POA: Diagnosis not present

## 2018-10-22 DIAGNOSIS — J9690 Respiratory failure, unspecified, unspecified whether with hypoxia or hypercapnia: Secondary | ICD-10-CM | POA: Diagnosis not present

## 2018-10-25 ENCOUNTER — Ambulatory Visit (INDEPENDENT_AMBULATORY_CARE_PROVIDER_SITE_OTHER): Payer: Medicare Other | Admitting: Family Medicine

## 2018-10-25 ENCOUNTER — Other Ambulatory Visit: Payer: Self-pay

## 2018-10-25 ENCOUNTER — Other Ambulatory Visit: Payer: Medicare Other

## 2018-10-25 DIAGNOSIS — E7220 Disorder of urea cycle metabolism, unspecified: Secondary | ICD-10-CM

## 2018-10-25 DIAGNOSIS — E118 Type 2 diabetes mellitus with unspecified complications: Secondary | ICD-10-CM | POA: Diagnosis not present

## 2018-10-25 DIAGNOSIS — N183 Chronic kidney disease, stage 3 unspecified: Secondary | ICD-10-CM

## 2018-10-25 LAB — COMPREHENSIVE METABOLIC PANEL
ALT: 23 U/L (ref 0–53)
AST: 20 U/L (ref 0–37)
Albumin: 4.1 g/dL (ref 3.5–5.2)
Alkaline Phosphatase: 87 U/L (ref 39–117)
BUN: 41 mg/dL — ABNORMAL HIGH (ref 6–23)
CO2: 38 mEq/L — ABNORMAL HIGH (ref 19–32)
Calcium: 9.7 mg/dL (ref 8.4–10.5)
Chloride: 93 mEq/L — ABNORMAL LOW (ref 96–112)
Creatinine, Ser: 1.52 mg/dL — ABNORMAL HIGH (ref 0.40–1.50)
GFR: 45.39 mL/min — ABNORMAL LOW (ref >60.00)
Glucose, Bld: 267 mg/dL — ABNORMAL HIGH (ref 70–99)
Potassium: 4.5 mEq/L (ref 3.5–5.1)
Sodium: 138 mEq/L (ref 135–145)
Total Bilirubin: 0.3 mg/dL (ref 0.2–1.2)
Total Protein: 7.5 g/dL (ref 6.0–8.3)

## 2018-10-25 LAB — HEMOGLOBIN A1C: Hgb A1c MFr Bld: 10.8 % — ABNORMAL HIGH (ref 4.6–6.5)

## 2018-10-25 LAB — AMMONIA: Ammonia: 41 umol/L — ABNORMAL HIGH (ref 11–35)

## 2018-10-26 ENCOUNTER — Other Ambulatory Visit: Payer: Medicare Other

## 2018-10-27 DIAGNOSIS — S2239XA Fracture of one rib, unspecified side, initial encounter for closed fracture: Secondary | ICD-10-CM | POA: Diagnosis not present

## 2018-10-27 DIAGNOSIS — J449 Chronic obstructive pulmonary disease, unspecified: Secondary | ICD-10-CM | POA: Diagnosis not present

## 2018-10-27 DIAGNOSIS — J9690 Respiratory failure, unspecified, unspecified whether with hypoxia or hypercapnia: Secondary | ICD-10-CM | POA: Diagnosis not present

## 2018-10-27 DIAGNOSIS — I259 Chronic ischemic heart disease, unspecified: Secondary | ICD-10-CM | POA: Diagnosis not present

## 2018-10-28 ENCOUNTER — Ambulatory Visit: Payer: Medicare Other | Admitting: Family Medicine

## 2018-10-28 ENCOUNTER — Encounter: Payer: Medicare Other | Admitting: Family Medicine

## 2018-10-28 NOTE — Progress Notes (Deleted)
Virtual Visit via Video Note  I connected with pt on 10/28/18 at  9:20 AM EDT by a video enabled telemedicine application and verified that I am speaking with the correct person using two identifiers.  Location patient: home Location provider:work or home office Persons participating in the virtual visit: patient, provider  I discussed the limitations of evaluation and management by telemedicine and the availability of in person appointments. The patient expressed understanding and agreed to proceed.  Telemedicine visit is a necessity given the COVID-19 restrictions in place at the current time.  HPI: 73 y/o WM being seen today for 1 mo f/u DM.  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  This is all in the setting CRI III.  DM: last visit was doing well, said glucoses in mornings 130s and he had stopped lantus.  His monitoring at home has seemed inadequate. His A1c just a few days ago came back 10.8%, so there is obviously some hyperglycemia being missed.  Currently on jardiance 36m qd and glipizide 5 qAM and 2.5 qPM.  ROS: See pertinent positives and negatives per HPI.  Past Medical History:  Diagnosis Date  . AICD (automatic cardioverter/defibrillator) present   . Alcohol abuse   . Anxiety   . Bipolar disorder ([place]   . Biventricular automatic implantable cardioverter defibrillator in situ    a. 01/2014 s/p MDT DTBA1D1 Viva XT CRT-D (ser # [id number]).  . CAD (coronary artery disease)    a. s/p prior PCI/stenting of the LCX;  b. 08/2014 MV: EF 20%, large septal, apical, and inferior infarct from apex to base, no ischemia;  c. 08/2014 NSTEMI/Cath: LM mod distal dzs extending into  ostial LCX (80%), LAD tortuous, RI nl, OM mod dzs, RCA 100 CTO with R->R and L->R collats-->Med Rx.  . CHF (congestive heart failure) (HG[place]   . Chronic atrial fibrillation    a. CHA2DS2VASc = 6->eliquis;  b. S/P AVN RFCA an BiV ICD placement.  . Chronic renal insufficiency, stage 3 (moderate) (HCC) 2020   GFR low 40s (estimated baseline sCr 1.5)  . Chronic respiratory failure (HCC)    hypoxic and hypercarbic  . Chronic systolic congestive heart failure (HCam[place]    a. 01/2014 Echo: EF 25-30%, mod conc LVH, mod dil LA.  .Marland KitchenComplete heart block ([place]    a. In setting of prior AV nodal ablation r/t afib-->BiV ICD (01/2014).  . COPD (chronic obstructive pulmonary disease) ([place]   . CVA (cerebral vascular accident) ([place]    a. Multiple prior embolic strokes.  . Dementia (HP[place]   . Depression   . Diabetes mellitus (HK[place]   . DVT (deep venous thrombosis) (HG[place]   . Hypertension   . Mitral valve disease    a. remote mitral replacement with Bjork Shiley valve;  b. 06/2006 Redo MVR with tissue valve.  . Mixed Ischemic and Nonischemic Cardiomyopathy    a. 8/.2015 Echo: Ef 25-30%;  b. 01/2014 s/p MDT [id number] ProudXT CRT-D (ser # [id number]).  . On home oxygen therapy    "2L; 24/7" (65/16/2019)  . Psoriasis    "back of his head; elbows; finger of left hand" (06/07/2017)  . Pure hypercholesterolemia   . S/P AV nodal ablation   . Tobacco dependence  current as of 07/2018  . Ventral hernia     Past Surgical History:  Procedure Laterality Date  . AV NODE ABLATION  2007  . CARDIAC CATHETERIZATION  06/2006   Mild ostial L main stenosis, CFX stent patent, RCA occluded (old)  . CORONARY ARTERY BYPASS GRAFT  2008  . CYSTOSCOPY W/ URETERAL STENT PLACEMENT  07/12/2011   Procedure: CYSTOSCOPY WITH RETROGRADE PYELOGRAM/URETERAL STENT PLACEMENT;  Surgeon: Alexis Frock, MD;  Location: [place];  Service: Urology;  Laterality: Left;  . ECHO DOPPLER COMPLETE(TRANSTHOR) ([facility] HX)  04/07/2017   LV EF: 25%-30%  .  EXPLORATORY LAPAROTOMY    . HERNIA REPAIR    . ICD GENERATOR CHANGE  02/01/2014   Gen change to: Medtronic VIVA pulse generator, serial number [id number]  . INSERT / REPLACE / REMOVE PACEMAKER    . LEFT HEART CATHETERIZATION WITH CORONARY ANGIOGRAM N/A 08/23/2014   Procedure: LEFT HEART CATHETERIZATION WITH CORONARY ANGIOGRAM;  Surgeon: Jettie Booze, MD;  [facility]: [facility] CATH LAB;  Service: Cardiovascular;  Laterality: N/A;  . MITRAL VALVE REPLACEMENT     remote [facility] valve 0539 with redo tissue valve 06/2006  . PACEMAKER GENERATOR CHANGE N/A 02/01/2014   Procedure: PACEMAKER GENERATOR CHANGE;  Surgeon: Deboraha Sprang, MD;  Location: [facility] CATH LAB;  Service: Cardiovascular;  Laterality: N/A;  . PACEMAKER PLACEMENT  2006   Changed to CRT-D in 2009  . RIGHT/LEFT HEART CATH AND CORONARY ANGIOGRAPHY N/A 06/09/2017   Procedure: RIGHT/LEFT HEART CATH AND CORONARY ANGIOGRAPHY;  Surgeon: Larey Dresser, MD;  Location: [place] CV LAB;  Service: Cardiovascular;  Laterality: N/A;  . TRANSTHORACIC ECHOCARDIOGRAM  10/2017   EF 30-35%, inf-lat and inf akin, MV bioprosth ok, mod dec RV fxn, pulm HTN (peak 65).  Michelene Gardener      Family History  Problem Relation Age of Onset  . Alzheimer's disease Mother   . CAD Mother   . Heart attack Father   . Heart disease Father   . CAD Father   . Heart attack Son   . Varicose Veins Son   . Deep vein thrombosis Son   . Heart disease Son   . Stroke Other   . Heart disease Other   . Heart disease Brother   . CAD Brother     SOCIAL HX: Divorced, 2 children, lives with daughter.  Worked in [employer]. Still smoking as of 07/2018.  Quit drinking 2017.   Current Outpatient Medications:  .  ACCU-CHEK GUIDE test strip, TEST TWICE A DAY, Disp: 100 each, Rfl: 0 .  arformoterol (BROVANA) 15 MCG/2ML NEBU, Take 2 mLs (15 mcg total) by nebulization 2 (two) times daily for 15 days., Disp: 120 mL, Rfl: 1 .  atorvastatin (LIPITOR) 40 MG tablet, Take 1 tablet  (40 mg total) by mouth daily at 6 PM., Disp: 90 tablet, Rfl: 1 .  blood glucose meter kit and supplies, Use to check blood sugar twice daily, Disp: 1 each, Rfl: 0 .  budesonide (PULMICORT) 0.5 MG/2ML nebulizer solution, Take 2 mLs (0.5 mg total) by nebulization 2 (two) times daily. DX: J44.9, Disp: 120 mL, Rfl: 4 .  carvedilol (COREG) 6.25 MG tablet, Take 1 tablet by mouth 2 (two) times daily., Disp: , Rfl:  .  digoxin (LANOXIN) 0.125 MG tablet, Take 1 tablet (0.125 mg total) by mouth daily., Disp: 90 tablet, Rfl: 3 .  ELIQUIS 5 MG TABS tablet, Take 1 tablet (5 mg total) by mouth 2 (two) times daily., Disp: 180  tablet, Rfl: 3 .  fluticasone (CUTIVATE) 0.05 % cream, Apply topically 2 (two) times daily. Dispense largest tube available please., Disp: 60 g, Rfl: 6 .  glipiZIDE (GLUCOTROL XL) 2.5 MG 24 hr tablet, Take 2 tab every morning and 1 tab every evening, Disp: , Rfl:  .  Insulin Glargine (LANTUS SOLOSTAR) 100 UNIT/ML Solostar Pen, 10 units SQ qhs, Disp: 5 pen, Rfl: 6 .  Insulin Pen Needle 31G X 6 MM MISC, Use to inject insulin at bedtime, Disp: 100 each, Rfl: 5 .  ipratropium-albuterol (DUONEB) 0.5-2.5 (3) MG/3ML SOLN, Take 3 mLs by nebulization every 4 (four) hours as needed., Disp: 360 mL, Rfl: 0 .  JARDIANCE 10 MG TABS tablet, Take 10 mg by mouth daily., Disp: 30 tablet, Rfl: 4 .  JARDIANCE 25 MG TABS tablet, TAKE 1 TABLET BY MOUTH EVERY DAY, Disp: 30 tablet, Rfl: 5 .  lactulose (CHRONULAC) 10 GM/15ML solution, TAKE 30 MILLILITERS BY MOUTH TWICE A DAY (Patient taking differently: TAKE 45 MILLILITERS BY MOUTH daily), Disp: 1800 mL, Rfl: 6 .  loratadine (CLARITIN) 10 MG tablet, Take 1 tablet (10 mg total) by mouth daily., Disp: 30 tablet, Rfl: 1 .  losartan (COZAAR) 25 MG tablet, Take 1 tablet (25 mg total) by mouth daily., Disp: 30 tablet, Rfl: 5 .  Multiple Vitamin (MULTIVITAMIN WITH MINERALS) TABS tablet, Take 1 tablet by mouth daily., Disp: 30 tablet, Rfl: 2 .  nitroGLYCERIN (NITROSTAT) 0.4  MG SL tablet, Place 1 tablet (0.4 mg total) under the tongue every 5 (five) minutes as needed for chest pain., Disp: 25 tablet, Rfl: 2 .  OXYGEN, Inhale 2.5 L into the lungs continuous. , Disp: , Rfl:  .  spironolactone (ALDACTONE) 25 MG tablet, Take 1 tablet (25 mg total) by mouth daily., Disp: 30 tablet, Rfl: 5 .  thiamine 100 MG tablet, Take 1 tablet (100 mg total) by mouth daily., Disp: 30 tablet, Rfl: 0 .  torsemide (DEMADEX) 20 MG tablet, Take 4 tablets (80 mg total) by mouth daily., Disp: 120 tablet, Rfl: 5  EXAM:  VITALS per patient if applicable:  GENERAL: alert, oriented, appears well and in no acute distress  HEENT: atraumatic, conjunttiva clear, no obvious abnormalities on inspection of external nose and ears  NECK: normal movements of the head and neck  LUNGS: on inspection no signs of respiratory distress, breathing rate appears normal, no obvious gross SOB, gasping or wheezing  CV: no obvious cyanosis  MS: moves all visible extremities without noticeable abnormality  PSYCH/NEURO: pleasant and cooperative, no obvious depression or anxiety, speech and thought processing grossly intact  LABS: none today    Chemistry      Component Value Date/Time   NA 138 10/25/2018 0922   NA 141 12/13/2017   K 4.5 10/25/2018 0922   CL 93 (L) 10/25/2018 0922   CO2 38 (H) 10/25/2018 0922   BUN 41 (H) 10/25/2018 0922   BUN 26 (A) 12/13/2017   CREATININE 1.52 (H) 10/25/2018 0922   GLU 220 12/13/2017      Component Value Date/Time   CALCIUM 9.7 10/25/2018 0922   ALKPHOS 87 10/25/2018 0922   AST 20 10/25/2018 0922   ALT 23 10/25/2018 0922   BILITOT 0.3 10/25/2018 0922     Lab Results  Component Value Date   HGBA1C 10.8 (H) 10/25/2018   Lab Results  Component Value Date   CHOL 140 11/12/2017   HDL 37 (L) 11/12/2017   LDLCALC 53 11/12/2017   TRIG 252 (H) 11/12/2017  CHOLHDL 3.8 11/12/2017   Lab Results  Component Value Date   WBC 13.7 (H) 06/10/2018   HGB 12.4 (L)  06/10/2018   HCT 40.2 06/10/2018   MCV 92.8 06/10/2018   PLT 218 06/10/2018    ASSESSMENT AND PLAN:  Discussed the following assessment and plan:  No diagnosis found.  1) DM 2:  Poor control ? Libre ? 70/30 bid Stop glipizide?  2) CRI III: recent sCr stable, lytes normal.  No NSAIDs.      FLP and CBC with next labs  I discussed the assessment and treatment plan with the patient. The patient was provided an opportunity to ask questions and all were answered. The patient agreed with the plan and demonstrated an understanding of the instructions.   The patient was advised to call back or seek an in-person evaluation if the symptoms worsen or if the condition fails to improve as anticipated.  F/u: ***  Signed:  Crissie Sickles, MD           10/28/2018

## 2018-11-03 ENCOUNTER — Ambulatory Visit: Payer: Medicare Other | Admitting: Family Medicine

## 2018-11-04 ENCOUNTER — Encounter: Payer: Self-pay | Admitting: Family Medicine

## 2018-11-04 ENCOUNTER — Ambulatory Visit (INDEPENDENT_AMBULATORY_CARE_PROVIDER_SITE_OTHER): Payer: Medicare Other | Admitting: Family Medicine

## 2018-11-04 ENCOUNTER — Other Ambulatory Visit: Payer: Self-pay

## 2018-11-04 ENCOUNTER — Telehealth: Payer: Self-pay | Admitting: Family Medicine

## 2018-11-04 VITALS — BP 122/65 | HR 61

## 2018-11-04 DIAGNOSIS — E1165 Type 2 diabetes mellitus with hyperglycemia: Secondary | ICD-10-CM | POA: Diagnosis not present

## 2018-11-04 DIAGNOSIS — R21 Rash and other nonspecific skin eruption: Secondary | ICD-10-CM | POA: Diagnosis not present

## 2018-11-04 DIAGNOSIS — M545 Low back pain, unspecified: Secondary | ICD-10-CM

## 2018-11-04 DIAGNOSIS — R2681 Unsteadiness on feet: Secondary | ICD-10-CM

## 2018-11-04 DIAGNOSIS — R5381 Other malaise: Secondary | ICD-10-CM | POA: Diagnosis not present

## 2018-11-04 MED ORDER — TRAMADOL HCL 50 MG PO TABS: ORAL_TABLET | ORAL | 0 refills | Status: DC

## 2018-11-04 MED ORDER — FLUTICASONE PROPIONATE 0.05 % EX CREA: TOPICAL_CREAM | Freq: Two times a day (BID) | CUTANEOUS | 6 refills | Status: DC

## 2018-11-04 MED ORDER — INSULIN GLARGINE 100 UNIT/ML SOLOSTAR PEN: PEN_INJECTOR | SUBCUTANEOUS | 6 refills | Status: DC

## 2018-11-04 NOTE — Telephone Encounter (Signed)
Pt's daughter was contacted and needed to reschedule. Per Dr.Mcgowen, okay to push out 1 week.

## 2018-11-04 NOTE — Telephone Encounter (Signed)
Patient's daughter is requesting Dr. Milinda Cave to call back about her father's 1 week follow up visit.

## 2018-11-04 NOTE — Progress Notes (Signed)
Virtual Visit via Video Note  I connected with [name] on 11/04/18 at  2:00 PM EDT by a video enabled telemedicine application and verified that I am speaking with the correct person using two identifiers. Location patient: home Location provider:work or home office Persons participating in the virtual visit: patient, [name]'s daughter, and myself.    I discussed the limitations of evaluation and management by telemedicine and the availability of in person appointments. The patient expressed understanding and agreed to proceed.  Telemedicine visit is a necessity given the COVID-19 restrictions in place at the current time.  HPI: 73 y/o WM being seen today for rash and for DM 2.   RASH: dermatitis on back of neck was improving with cutivate cream 0.05% but they have run out of it.  DM: most recent A1c on 10/25/18 rose from 9.2 to 10.8. He has been on oral meds (jardiance and glipizide) + lantus, but had not been using much lantus at all last f/u visit 5 wks ago.   Glucoses 300s lately.  Discussed getting back on lantus regularly, also increase dose to 12 U.  Had onset of low back pain in midline about 3d/a. He has been sitting down and lying in bed a lot the last few months.  The pain doesn't radiate. He gets up and starts walking and the pain eases up.  No flank pain or hematuria.  No fevers. He usually walks w/out assistance but has been using walker and WC more lately.  Has unsteady gait when walking alone lately. Not eating well lately.  No n/v or abd pain.  ROS: no CP, +chronic DOE,  no cough, no dizziness, no HAs, no melena/hematochezia.  No polyuria or polydipsia.  No myalgias or arthralgias.   Past Medical History:  Diagnosis Date  . AICD (automatic cardioverter/defibrillator) present   . Alcohol abuse   . Anxiety   . Bipolar disorder ([place])   . Biventricular automatic implantable cardioverter defibrillator in situ    a. 01/2014 s/p MDT DTBA1D1 Viva XT CRT-D (ser # [id number]).  . CAD  (coronary artery disease)    a. s/p prior PCI/stenting of the LCX;  b. 08/2014 MV: EF 20%, large septal, apical, and inferior infarct from apex to base, no ischemia;  c. 08/2014 NSTEMI/Cath: LM mod distal dzs extending into ostial LCX (80%), LAD tortuous, RI nl, OM mod dzs, RCA 100 CTO with R->R and L->R collats-->Med Rx.  . CHF (congestive heart failure) ([place])   . Chronic atrial fibrillation    a. CHA2DS2VASc = 6->eliquis;  b. S/P AVN RFCA an BiV ICD placement.  . Chronic renal insufficiency, stage 3 (moderate) (HCC) 2020   GFR low 40s (estimated baseline sCr 1.5)  . Chronic respiratory failure (HCC)    hypoxic and hypercarbic  . Chronic systolic congestive heart failure ([place])    a. 01/2014 Echo: EF 25-30%, mod conc LVH, mod dil LA.  Marland Kitchen Complete heart block ([place])    a. In setting of prior AV nodal ablation r/t afib-->BiV ICD (01/2014).  . COPD (chronic obstructive pulmonary disease) ([place])   . CVA (cerebral vascular accident) ([place])    a. Multiple prior embolic strokes.  . Dementia ([place])   . Depression   . Diabetes mellitus ([place])   . DVT (deep venous thrombosis) ([place])   . Hypertension   . Mitral valve disease    a. remote mitral replacement with Bjork Shiley valve;  b. 06/2006 Redo MVR with tissue valve.  . Mixed Ischemic and Nonischemic Cardiomyopathy  a. 8/.2015 Echo: Ef 25-30%;  b. 01/2014 s/p MDT [id number] Pershing Proud XT CRT-D (ser # [id number]).  . On home oxygen therapy    "2L; 24/7" (65/16/2019)  . Psoriasis    "back of his head; elbows; finger of left hand" (06/07/2017)  . Pure hypercholesterolemia   . S/P AV nodal ablation   . Tobacco dependence    current as of 07/2018  . Ventral hernia     Past Surgical History:  Procedure Laterality Date  . AV NODE ABLATION  2007  . CARDIAC CATHETERIZATION  06/2006   Mild ostial L main stenosis, CFX stent patent, RCA occluded (old)  . CORONARY ARTERY BYPASS GRAFT  2008  . CYSTOSCOPY W/ URETERAL STENT PLACEMENT  07/12/2011   Procedure: CYSTOSCOPY  WITH RETROGRADE PYELOGRAM/URETERAL STENT PLACEMENT;  Surgeon: Alexis Frock, MD;  Location: [place];  Service: Urology;  Laterality: Left;  . ECHO DOPPLER COMPLETE(TRANSTHOR) ([facility] HX)  04/07/2017   LV EF: 25%-30%  . EXPLORATORY LAPAROTOMY    . HERNIA REPAIR    . ICD GENERATOR CHANGE  02/01/2014   Gen change to: Medtronic VIVA pulse generator, serial number [id number]  . INSERT / REPLACE / REMOVE PACEMAKER    . LEFT HEART CATHETERIZATION WITH CORONARY ANGIOGRAM N/A 08/23/2014   Procedure: LEFT HEART CATHETERIZATION WITH CORONARY ANGIOGRAM;  Surgeon: Jettie Booze, MD;  Location: [facility] CATH LAB;  Service: Cardiovascular;  Laterality: N/A;  . MITRAL VALVE REPLACEMENT     remote [facility] valve 0932 with redo tissue valve 06/2006  . PACEMAKER GENERATOR CHANGE N/A 02/01/2014   Procedure: PACEMAKER GENERATOR CHANGE;  Surgeon: Deboraha Sprang, MD;  Location: [facility] CATH LAB;  Service: Cardiovascular;  Laterality: N/A;  . PACEMAKER PLACEMENT  2006   Changed to CRT-D in 2009  . RIGHT/LEFT HEART CATH AND CORONARY ANGIOGRAPHY N/A 06/09/2017   Procedure: RIGHT/LEFT HEART CATH AND CORONARY ANGIOGRAPHY;  Surgeon: Larey Dresser, MD;  Location: [place] CV LAB;  Service: Cardiovascular;  Laterality: N/A;  . TRANSTHORACIC ECHOCARDIOGRAM  10/2017   EF 30-35%, inf-lat and inf akin, MV bioprosth ok, mod dec RV fxn, pulm HTN (peak 65).  Michelene Gardener      Family History  Problem Relation Age of Onset  . Alzheimer's disease Mother   . CAD Mother   . Heart attack Father   . Heart disease Father   . CAD Father   . Heart attack Son   . Varicose Veins Son   . Deep vein thrombosis Son   . Heart disease Son   . Stroke Other   . Heart disease Other   . Heart disease Brother   . CAD Brother      Current Outpatient Medications:  .  ACCU-CHEK GUIDE test strip, TEST TWICE A DAY, Disp: 100 each, Rfl: 0 .  arformoterol (BROVANA) 15 MCG/2ML NEBU, Take 2 mLs (15 mcg total) by nebulization 2 (two) times daily  for 15 days., Disp: 120 mL, Rfl: 1 .  atorvastatin (LIPITOR) 40 MG tablet, Take 1 tablet (40 mg total) by mouth daily at 6 PM., Disp: 90 tablet, Rfl: 1 .  blood glucose meter kit and supplies, Use to check blood sugar twice daily, Disp: 1 each, Rfl: 0 .  budesonide (PULMICORT) 0.5 MG/2ML nebulizer solution, Take 2 mLs (0.5 mg total) by nebulization 2 (two) times daily. DX: J44.9, Disp: 120 mL, Rfl: 4 .  carvedilol (COREG) 6.25 MG tablet, Take 1 tablet by mouth 2 (two) times daily., Disp: , Rfl:  .  digoxin (LANOXIN) 0.125 MG tablet, Take 1 tablet (0.125 mg total) by mouth daily., Disp: 90 tablet, Rfl: 3 .  ELIQUIS 5 MG TABS tablet, Take 1 tablet (5 mg total) by mouth 2 (two) times daily., Disp: 180 tablet, Rfl: 3 .  fluticasone (CUTIVATE) 0.05 % cream, Apply topically 2 (two) times daily. Dispense largest tube available please., Disp: 60 g, Rfl: 6 .  glipiZIDE (GLUCOTROL XL) 2.5 MG 24 hr tablet, Take 2 tab every morning and 1 tab every evening, Disp: , Rfl:  .  Insulin Glargine (LANTUS SOLOSTAR) 100 UNIT/ML Solostar Pen, 10 units SQ qhs, Disp: 5 pen, Rfl: 6 .  Insulin Pen Needle 31G X 6 MM MISC, Use to inject insulin at bedtime, Disp: 100 each, Rfl: 5 .  ipratropium-albuterol (DUONEB) 0.5-2.5 (3) MG/3ML SOLN, Take 3 mLs by nebulization every 4 (four) hours as needed., Disp: 360 mL, Rfl: 0 .  JARDIANCE 10 MG TABS tablet, Take 10 mg by mouth daily., Disp: 30 tablet, Rfl: 4 .  JARDIANCE 25 MG TABS tablet, TAKE 1 TABLET BY MOUTH EVERY DAY, Disp: 30 tablet, Rfl: 5 .  lactulose (CHRONULAC) 10 GM/15ML solution, TAKE 30 MILLILITERS BY MOUTH TWICE A DAY (Patient taking differently: TAKE 45 MILLILITERS BY MOUTH daily), Disp: 1800 mL, Rfl: 6 .  loratadine (CLARITIN) 10 MG tablet, Take 1 tablet (10 mg total) by mouth daily., Disp: 30 tablet, Rfl: 1 .  losartan (COZAAR) 25 MG tablet, Take 1 tablet (25 mg total) by mouth daily., Disp: 30 tablet, Rfl: 5 .  Multiple Vitamin (MULTIVITAMIN WITH MINERALS) TABS tablet,  Take 1 tablet by mouth daily., Disp: 30 tablet, Rfl: 2 .  nitroGLYCERIN (NITROSTAT) 0.4 MG SL tablet, Place 1 tablet (0.4 mg total) under the tongue every 5 (five) minutes as needed for chest pain., Disp: 25 tablet, Rfl: 2 .  OXYGEN, Inhale 2.5 L into the lungs continuous. , Disp: , Rfl:  .  spironolactone (ALDACTONE) 25 MG tablet, Take 1 tablet (25 mg total) by mouth daily., Disp: 30 tablet, Rfl: 5 .  thiamine 100 MG tablet, Take 1 tablet (100 mg total) by mouth daily., Disp: 30 tablet, Rfl: 0 .  torsemide (DEMADEX) 20 MG tablet, Take 4 tablets (80 mg total) by mouth daily., Disp: 120 tablet, Rfl: 5  EXAM:  VITALS per patient if applicable: BP 329/92 (BP Location: Left Arm, Patient Position: Sitting, Cuff Size: Normal)   Pulse 61    GENERAL: alert, oriented, appears well and in no acute distress  HEENT: atraumatic, conjunttiva clear, no obvious abnormalities on inspection of external nose and ears  NECK: normal movements of the head and neck  LUNGS: on inspection no signs of respiratory distress, breathing rate appears normal, no obvious gross SOB, gasping or wheezing  CV: no obvious cyanosis  MS: moves all visible extremities without noticeable abnormality  PSYCH/NEURO: pleasant and cooperative, no obvious depression or anxiety, speech and thought processing grossly intact  LABS: none today    Chemistry      Component Value Date/Time   NA 138 10/25/2018 0922   NA 141 12/13/2017   K 4.5 10/25/2018 0922   CL 93 (L) 10/25/2018 0922   CO2 38 (H) 10/25/2018 0922   BUN 41 (H) 10/25/2018 0922   BUN 26 (A) 12/13/2017   CREATININE 1.52 (H) 10/25/2018 0922   GLU 220 12/13/2017      Component Value Date/Time   CALCIUM 9.7 10/25/2018 0922   ALKPHOS 87 10/25/2018 0922   AST 20 10/25/2018 4268  ALT 23 10/25/2018 0922   BILITOT 0.3 10/25/2018 0518     Lab Results  Component Value Date   HGBA1C 10.8 (H) 10/25/2018    ASSESSMENT AND PLAN:  Discussed the following assessment  and plan:  1) Musculoskeletal low back pain w/out radiculopathy: he needs to get up and move more. HH [name] will be arranged.  Will continue 1000 mg tylenol bid and will rx tramadol 60m, 1-2 tid prn, #42, no RF.  Therapeutic expectations and side effect profile of medication discussed today.  Patient's questions answered.  2) DM 2: poor control.  Will take it slow with insulin.  Get back on lantus daily and increase from 10 U to 12 U qhs at this time.  Continue glucose monitoring 4 times per day if possible. Continue oral diabetic meds.  3) Rash: continue cutivate cream, rf sent in today. I discussed the assessment and treatment plan with the patient. The patient was provided an opportunity to ask questions and all were answered. The patient agreed with the plan and demonstrated an understanding of the instructions.   The patient was advised to call back or seek an in-person evaluation if the symptoms worsen or if the condition fails to improve as anticipated.  F/u: 1 wk recheck pain/tramadol use  Signed:  PCrissie Sickles MD           11/04/2018

## 2018-11-07 ENCOUNTER — Other Ambulatory Visit: Payer: Self-pay

## 2018-11-07 ENCOUNTER — Telehealth: Payer: Self-pay | Admitting: Family Medicine

## 2018-11-07 ENCOUNTER — Telehealth: Payer: Medicare Other | Admitting: Neurology

## 2018-11-07 DIAGNOSIS — M545 Low back pain, unspecified: Secondary | ICD-10-CM

## 2018-11-07 DIAGNOSIS — R2681 Unsteadiness on feet: Secondary | ICD-10-CM

## 2018-11-07 DIAGNOSIS — R5381 Other malaise: Secondary | ICD-10-CM

## 2018-11-07 MED ORDER — NYSTATIN 100000 UNIT/ML MT SUSP: OROMUCOSAL | 0 refills | Status: DC

## 2018-11-07 NOTE — Telephone Encounter (Signed)
Pt's daughter was notified med was sent.

## 2018-11-07 NOTE — Patient Outreach (Signed)
[facility]) Care Management  11/07/2018  [name] August 03, 1945 [id number]   Telephone Screen  Referral Date: 11/07/2018 Referral Source: MD Office Referral Reason:" pt with low back pain, please assess for PT" Insurance: [facility] Medicare   Referral received for HHPT services. [facility] is not a home health agency and does not provide these service. RN CM made phone call to referring MD office personnel to advise them of this.     Plan: RN CM will close case at  this time.    Antionette Fairy, RN,BSN,CCM [facility] Care Management Telephonic Care Management Coordinator Direct Phone: [phone] Toll Free: [phone] Fax: [phone]

## 2018-11-07 NOTE — Telephone Encounter (Signed)
Copied from CRM [id number]. Topic: Quick Communication - Home Health Verbal Orders >> Nov 07, 2018  1:04 PM Randol Kern wrote: Caller/Agency: [facility]  Callback Number: [phone] Requesting OT/PT/Skilled Nursing/Social Work/Speech Therapy: Called to report that they do not provide physical therapy. Please advise

## 2018-11-07 NOTE — Telephone Encounter (Signed)
Please advise other options, thanks.

## 2018-11-07 NOTE — Telephone Encounter (Signed)
[name] is working on [facility] PT for pt, any other suggestions at this time?

## 2018-11-07 NOTE — Telephone Encounter (Signed)
Per PCP, [facility] referral placed for PT. Dx: low back pain, debilitated patient, unsteady gait.

## 2018-11-07 NOTE — Telephone Encounter (Signed)
Just HH PT.  No other suggestions at this time.-thx

## 2018-11-07 NOTE — Telephone Encounter (Signed)
Noted agree

## 2018-11-07 NOTE — Telephone Encounter (Signed)
Copied from CRM [id number]. Topic: Quick Communication - Rx Refill/Question >> Nov 07, 2018  9:57 AM [name] E wrote: Medication: Pt is experiencing Thrush and asked for something to be called in to help with this/ please advise   Has the patient contacted their pharmacy? No   Preferred Pharmacy (with phone number or street name): [id number] [place], [place] - [address] [phone] (Phone) [phone] (Fax)    Agent: Please be advised that RX refills may take up to 3 business days. We ask that you follow-up with your pharmacy.

## 2018-11-07 NOTE — Telephone Encounter (Signed)
Per PCP, order placed for Home Health. Evaluate for PT. [facility] does not provide PT services.

## 2018-11-07 NOTE — Telephone Encounter (Signed)
Nystatin susp eRx'd.

## 2018-11-07 NOTE — Telephone Encounter (Signed)
Pt's daughter was notified regarding referral and no other suggestions at this time for pt's pain.

## 2018-11-07 NOTE — Telephone Encounter (Signed)
Pt's daughter called said pt is still having back pain. [facility] to [facility] and they said it was nothing they can do because he is already on pain meds. They tried walking him but it is still not helping. She said they have to make him walk. She wants to know what to do?   CB [phone]

## 2018-11-08 ENCOUNTER — Ambulatory Visit: Payer: Self-pay | Admitting: Neurology

## 2018-11-09 ENCOUNTER — Other Ambulatory Visit (HOSPITAL_COMMUNITY): Payer: Self-pay | Admitting: Cardiology

## 2018-11-10 ENCOUNTER — Encounter: Payer: Self-pay | Admitting: Neurology

## 2018-11-10 ENCOUNTER — Telehealth (INDEPENDENT_AMBULATORY_CARE_PROVIDER_SITE_OTHER): Payer: Medicare Other | Admitting: Neurology

## 2018-11-10 ENCOUNTER — Ambulatory Visit: Payer: Medicare Other | Admitting: Family Medicine

## 2018-11-10 ENCOUNTER — Other Ambulatory Visit: Payer: Self-pay

## 2018-11-10 NOTE — Progress Notes (Signed)
Patient rescheduled

## 2018-11-14 ENCOUNTER — Telehealth: Payer: Self-pay | Admitting: Family Medicine

## 2018-11-14 NOTE — Telephone Encounter (Signed)
Copied from [place] [id number]. Topic: General - Other >> Nov 14, 2018  3:10 PM Lennox Solders wrote: Reason for CRM: [facility] is calling physical therapy will be delayed until 11-21-2018. Pt will be going out of town

## 2018-11-14 NOTE — Telephone Encounter (Signed)
Forward to [facility] clinical team

## 2018-11-15 NOTE — Telephone Encounter (Signed)
Noted  

## 2018-11-16 DIAGNOSIS — J9690 Respiratory failure, unspecified, unspecified whether with hypoxia or hypercapnia: Secondary | ICD-10-CM | POA: Diagnosis not present

## 2018-11-16 DIAGNOSIS — S2239XA Fracture of one rib, unspecified side, initial encounter for closed fracture: Secondary | ICD-10-CM | POA: Diagnosis not present

## 2018-11-16 DIAGNOSIS — J449 Chronic obstructive pulmonary disease, unspecified: Secondary | ICD-10-CM | POA: Diagnosis not present

## 2018-11-16 DIAGNOSIS — I5043 Acute on chronic combined systolic (congestive) and diastolic (congestive) heart failure: Secondary | ICD-10-CM | POA: Diagnosis not present

## 2018-11-21 ENCOUNTER — Ambulatory Visit (INDEPENDENT_AMBULATORY_CARE_PROVIDER_SITE_OTHER): Payer: Medicare Other | Admitting: *Deleted

## 2018-11-21 DIAGNOSIS — J449 Chronic obstructive pulmonary disease, unspecified: Secondary | ICD-10-CM | POA: Diagnosis not present

## 2018-11-21 DIAGNOSIS — I255 Ischemic cardiomyopathy: Secondary | ICD-10-CM

## 2018-11-21 DIAGNOSIS — Z95810 Presence of automatic (implantable) cardiac defibrillator: Secondary | ICD-10-CM | POA: Diagnosis not present

## 2018-11-21 DIAGNOSIS — J9622 Acute and chronic respiratory failure with hypercapnia: Secondary | ICD-10-CM | POA: Diagnosis not present

## 2018-11-21 DIAGNOSIS — I428 Other cardiomyopathies: Secondary | ICD-10-CM | POA: Diagnosis not present

## 2018-11-21 DIAGNOSIS — Z9181 History of falling: Secondary | ICD-10-CM | POA: Diagnosis not present

## 2018-11-21 DIAGNOSIS — M545 Low back pain: Secondary | ICD-10-CM | POA: Diagnosis not present

## 2018-11-21 DIAGNOSIS — J9621 Acute and chronic respiratory failure with hypoxia: Secondary | ICD-10-CM | POA: Diagnosis not present

## 2018-11-21 DIAGNOSIS — E1122 Type 2 diabetes mellitus with diabetic chronic kidney disease: Secondary | ICD-10-CM | POA: Diagnosis not present

## 2018-11-21 DIAGNOSIS — I5022 Chronic systolic (congestive) heart failure: Secondary | ICD-10-CM

## 2018-11-21 DIAGNOSIS — Z8673 Personal history of transient ischemic attack (TIA), and cerebral infarction without residual deficits: Secondary | ICD-10-CM | POA: Diagnosis not present

## 2018-11-21 DIAGNOSIS — N183 Chronic kidney disease, stage 3 (moderate): Secondary | ICD-10-CM | POA: Diagnosis not present

## 2018-11-21 DIAGNOSIS — Z9981 Dependence on supplemental oxygen: Secondary | ICD-10-CM | POA: Diagnosis not present

## 2018-11-21 DIAGNOSIS — I252 Old myocardial infarction: Secondary | ICD-10-CM | POA: Diagnosis not present

## 2018-11-21 DIAGNOSIS — I482 Chronic atrial fibrillation, unspecified: Secondary | ICD-10-CM | POA: Diagnosis not present

## 2018-11-21 DIAGNOSIS — I130 Hypertensive heart and chronic kidney disease with heart failure and stage 1 through stage 4 chronic kidney disease, or unspecified chronic kidney disease: Secondary | ICD-10-CM | POA: Diagnosis not present

## 2018-11-21 DIAGNOSIS — E785 Hyperlipidemia, unspecified: Secondary | ICD-10-CM | POA: Diagnosis not present

## 2018-11-21 DIAGNOSIS — Z794 Long term (current) use of insulin: Secondary | ICD-10-CM | POA: Diagnosis not present

## 2018-11-21 DIAGNOSIS — Z86718 Personal history of other venous thrombosis and embolism: Secondary | ICD-10-CM | POA: Diagnosis not present

## 2018-11-21 DIAGNOSIS — Z7901 Long term (current) use of anticoagulants: Secondary | ICD-10-CM | POA: Diagnosis not present

## 2018-11-21 DIAGNOSIS — I2510 Atherosclerotic heart disease of native coronary artery without angina pectoris: Secondary | ICD-10-CM | POA: Diagnosis not present

## 2018-11-21 LAB — CUP PACEART REMOTE DEVICE CHECK
Battery Remaining Longevity: 22 mo
Battery Voltage: 2.92 V
Brady Statistic AP VP Percent: 0 %
Brady Statistic AP VS Percent: 0 %
Brady Statistic AS VP Percent: 0 %
Brady Statistic AS VS Percent: 0 %
Brady Statistic RA Percent Paced: 0 %
Brady Statistic RV Percent Paced: 96.02 %
Date Time Interrogation Session: 20200615062605
HighPow Impedance: 45 Ohm
HighPow Impedance: 58 Ohm
Implantable Lead Implant Date: 20060307
Implantable Lead Implant Date: 20080110
Implantable Lead Implant Date: 20090617
Implantable Lead Location: 753858
Implantable Lead Location: 753859
Implantable Lead Location: 753860
Implantable Lead Model: 5071
Implantable Lead Model: 5076
Implantable Lead Model: 6947
Implantable Pulse Generator Implant Date: 20150827
Lead Channel Impedance Value: 399 Ohm
Lead Channel Impedance Value: 4047 Ohm
Lead Channel Impedance Value: 4047 Ohm
Lead Channel Impedance Value: 513 Ohm
Lead Channel Impedance Value: 570 Ohm
Lead Channel Impedance Value: 589 Ohm
Lead Channel Pacing Threshold Amplitude: 0.625 V
Lead Channel Pacing Threshold Amplitude: 1 V
Lead Channel Pacing Threshold Amplitude: 1.125 V
Lead Channel Pacing Threshold Pulse Width: 0.4 ms
Lead Channel Pacing Threshold Pulse Width: 0.4 ms
Lead Channel Pacing Threshold Pulse Width: 0.4 ms
Lead Channel Sensing Intrinsic Amplitude: 0.5 mV
Lead Channel Sensing Intrinsic Amplitude: 0.75 mV
Lead Channel Sensing Intrinsic Amplitude: 24.5 mV
Lead Channel Sensing Intrinsic Amplitude: 24.5 mV
Lead Channel Setting Pacing Amplitude: 2.5 V
Lead Channel Setting Pacing Amplitude: 2.5 V
Lead Channel Setting Pacing Pulse Width: 0.4 ms
Lead Channel Setting Pacing Pulse Width: 0.4 ms
Lead Channel Setting Sensing Sensitivity: 0.45 mV

## 2018-11-22 DIAGNOSIS — S2239XA Fracture of one rib, unspecified side, initial encounter for closed fracture: Secondary | ICD-10-CM | POA: Diagnosis not present

## 2018-11-22 DIAGNOSIS — I5043 Acute on chronic combined systolic (congestive) and diastolic (congestive) heart failure: Secondary | ICD-10-CM | POA: Diagnosis not present

## 2018-11-22 DIAGNOSIS — J9690 Respiratory failure, unspecified, unspecified whether with hypoxia or hypercapnia: Secondary | ICD-10-CM | POA: Diagnosis not present

## 2018-11-22 DIAGNOSIS — J449 Chronic obstructive pulmonary disease, unspecified: Secondary | ICD-10-CM | POA: Diagnosis not present

## 2018-11-23 ENCOUNTER — Encounter: Payer: Medicare Other | Admitting: Family Medicine

## 2018-11-23 ENCOUNTER — Other Ambulatory Visit: Payer: Self-pay

## 2018-11-23 NOTE — Progress Notes (Deleted)
Virtual Visit via Video Note  I connected with pt on 11/23/18 at 11:00 AM EDT by a video enabled telemedicine application and verified that I am speaking with the correct person using two identifiers.  Location patient: home Location provider:work or home office Persons participating in the virtual visit: patient, provider  I discussed the limitations of evaluation and management by telemedicine and the availability of in person appointments. The patient expressed understanding and agreed to proceed.  Telemedicine visit is a necessity given the COVID-19 restrictions in place at the current time.  HPI: 73 y/o WF being seen today for 2 wk f/u back pain. Last visit he had acute onset of midline LBP about 3 d prior. No radicular sx's, no systemic sx's, had been sitting and lying for prolonged periods due to poor ambulatory abilities at baseline.  Pain eased up some with getting up and walking. I dx'd him with musculoskeletal LBP and recommended tylenol and also rx'd tramadol prn severe pain.  Also referred him to PT.  Interim hx:   ROS: See pertinent positives and negatives per HPI.  Past Medical History:  Diagnosis Date  . AICD (automatic cardioverter/defibrillator) present   . Alcohol abuse   . Anxiety   . Bipolar disorder ([place])   . Biventricular automatic implantable cardioverter defibrillator in situ    a. 01/2014 s/p MDT [id number] Viva XT CRT-D (ser # [id number]).  . CAD (coronary artery disease)    a. s/p prior PCI/stenting of the LCX;  b. 08/2014 MV: EF 20%, large septal, apical, and inferior infarct from apex to base, no ischemia;  c. 08/2014 NSTEMI/Cath: LM mod distal dzs extending into ostial LCX (80%), LAD tortuous, RI nl, OM mod dzs, RCA 100 CTO with R->R and L->R collats-->Med Rx.  . CHF (congestive heart failure) ([place])   . Chronic atrial fibrillation    a. CHA2DS2VASc = 6->eliquis;  b. S/P AVN RFCA an BiV ICD placement.  . Chronic renal insufficiency, stage 3 (moderate) (HCC)  2020   GFR low 40s (estimated baseline sCr 1.5)  . Chronic respiratory failure (HCC)    hypoxic and hypercarbic  . Chronic systolic congestive heart failure ([place])    a. 01/2014 Echo: EF 25-30%, mod conc LVH, mod dil LA.  Marland Kitchen Complete heart block ([place])    a. In setting of prior AV nodal ablation r/t afib-->BiV ICD (01/2014).  . COPD (chronic obstructive pulmonary disease) ([place])   . CVA (cerebral vascular accident) ([place])    a. Multiple prior embolic strokes.  . Dementia ([place])   . Depression   . Diabetes mellitus ([place])   . DVT (deep venous thrombosis) ([place])   . Hypertension   . Mitral valve disease    a. remote mitral replacement with Bjork Shiley valve;  b. 06/2006 Redo MVR with tissue valve.  . Mixed Ischemic and Nonischemic Cardiomyopathy    a. 8/.2015 Echo: Ef 25-30%;  b. 01/2014 s/p MDT [id number] Pershing Proud XT CRT-D (ser # [id number]).  . On home oxygen therapy    "2L; 24/7" (65/16/2019)  . Psoriasis    "back of his head; elbows; finger of left hand" (06/07/2017)  . Pure hypercholesterolemia   . S/P AV nodal ablation   . Tobacco dependence    current as of 07/2018  . Ventral hernia     Past Surgical History:  Procedure Laterality Date  . AV NODE ABLATION  2007  . CARDIAC CATHETERIZATION  06/2006   Mild ostial L main stenosis, CFX stent patent, RCA occluded (old)  .  CORONARY ARTERY BYPASS GRAFT  2008  . CYSTOSCOPY W/ URETERAL STENT PLACEMENT  07/12/2011   Procedure: CYSTOSCOPY WITH RETROGRADE PYELOGRAM/URETERAL STENT PLACEMENT;  Surgeon: Alexis Frock, MD;  Location: [facility];  Service: Urology;  Laterality: Left;  . ECHO DOPPLER COMPLETE(TRANSTHOR) ([facility] HX)  04/07/2017   LV EF: 25%-30%  . EXPLORATORY LAPAROTOMY    . HERNIA REPAIR    . ICD GENERATOR CHANGE  02/01/2014   Gen change to: Medtronic VIVA pulse generator, serial number [id number]  . INSERT / REPLACE / REMOVE PACEMAKER    . LEFT HEART CATHETERIZATION WITH CORONARY ANGIOGRAM N/A 08/23/2014   Procedure: LEFT HEART CATHETERIZATION  WITH CORONARY ANGIOGRAM;  Surgeon: Jettie Booze, MD;  Location: [facility] CATH LAB;  Service: Cardiovascular;  Laterality: N/A;  . MITRAL VALVE REPLACEMENT     remote [facility] valve 4888 with redo tissue valve 06/2006  . PACEMAKER GENERATOR CHANGE N/A 02/01/2014   Procedure: PACEMAKER GENERATOR CHANGE;  Surgeon: Deboraha Sprang, MD;  Location: [facility] CATH LAB;  Service: Cardiovascular;  Laterality: N/A;  . PACEMAKER PLACEMENT  2006   Changed to CRT-D in 2009  . RIGHT/LEFT HEART CATH AND CORONARY ANGIOGRAPHY N/A 06/09/2017   Procedure: RIGHT/LEFT HEART CATH AND CORONARY ANGIOGRAPHY;  Surgeon: Larey Dresser, MD;  Location: [facility] CV LAB;  Service: Cardiovascular;  Laterality: N/A;  . TRANSTHORACIC ECHOCARDIOGRAM  10/2017   EF 30-35%, inf-lat and inf akin, MV bioprosth ok, mod dec RV fxn, pulm HTN (peak 65).  Michelene Gardener      Family History  Problem Relation Age of Onset  . Alzheimer's disease Mother   . CAD Mother   . Heart attack Father   . Heart disease Father   . CAD Father   . Heart attack Son   . Varicose Veins Son   . Deep vein thrombosis Son   . Heart disease Son   . Stroke Other   . Heart disease Other   . Heart disease Brother   . CAD Brother      Current Outpatient Medications:  .  ACCU-CHEK GUIDE test strip, TEST TWICE A DAY, Disp: 100 each, Rfl: 0 .  arformoterol (BROVANA) 15 MCG/2ML NEBU, Take 2 mLs (15 mcg total) by nebulization 2 (two) times daily for 15 days. (Patient not taking: Reported on 11/10/2018), Disp: 120 mL, Rfl: 1 .  atorvastatin (LIPITOR) 40 MG tablet, Take 1 tablet (40 mg total) by mouth daily at 6 PM., Disp: 90 tablet, Rfl: 2 .  blood glucose meter kit and supplies, Use to check blood sugar twice daily, Disp: 1 each, Rfl: 0 .  budesonide (PULMICORT) 0.5 MG/2ML nebulizer solution, Take 2 mLs (0.5 mg total) by nebulization 2 (two) times daily. DX: J44.9, Disp: 120 mL, Rfl: 4 .  carvedilol (COREG) 6.25 MG tablet, Take 1 tablet (6.25 mg total) by mouth 2  (two) times daily with a meal., Disp: 180 tablet, Rfl: 2 .  digoxin (LANOXIN) 0.125 MG tablet, Take 1 tablet (0.125 mg total) by mouth daily., Disp: 90 tablet, Rfl: 2 .  ELIQUIS 5 MG TABS tablet, Take 1 tablet (5 mg total) by mouth 2 (two) times daily., Disp: 180 tablet, Rfl: 3 .  fluticasone (CUTIVATE) 0.05 % cream, Apply topically 2 (two) times daily. Dispense largest tube available please., Disp: 60 g, Rfl: 6 .  glipiZIDE (GLUCOTROL XL) 2.5 MG 24 hr tablet, Take 2 tab every morning and 1 tab every evening, Disp: , Rfl:  .  Insulin Glargine (LANTUS SOLOSTAR) 100  UNIT/ML Solostar Pen, 12 units SQ qhs, Disp: 5 pen, Rfl: 6 .  Insulin Pen Needle 31G X 6 MM MISC, Use to inject insulin at bedtime, Disp: 100 each, Rfl: 5 .  ipratropium-albuterol (DUONEB) 0.5-2.5 (3) MG/3ML SOLN, Take 3 mLs by nebulization every 4 (four) hours as needed., Disp: 360 mL, Rfl: 0 .  JARDIANCE 10 MG TABS tablet, Take 10 mg by mouth daily., Disp: 30 tablet, Rfl: 4 .  lactulose (CHRONULAC) 10 GM/15ML solution, TAKE 30 MILLILITERS BY MOUTH TWICE A DAY (Patient taking differently: TAKE 45 MILLILITERS BY MOUTH daily), Disp: 1800 mL, Rfl: 6 .  loratadine (CLARITIN) 10 MG tablet, Take 1 tablet (10 mg total) by mouth daily., Disp: 30 tablet, Rfl: 1 .  losartan (COZAAR) 25 MG tablet, Take 1 tablet (25 mg total) by mouth daily., Disp: 30 tablet, Rfl: 5 .  Multiple Vitamin (MULTIVITAMIN WITH MINERALS) TABS tablet, Take 1 tablet by mouth daily., Disp: 30 tablet, Rfl: 2 .  nitroGLYCERIN (NITROSTAT) 0.4 MG SL tablet, Place 1 tablet (0.4 mg total) under the tongue every 5 (five) minutes as needed for chest pain. (Patient not taking: Reported on 11/04/2018), Disp: 25 tablet, Rfl: 2 .  OXYGEN, Inhale 2.5 L into the lungs continuous. , Disp: , Rfl:  .  spironolactone (ALDACTONE) 25 MG tablet, Take 1 tablet (25 mg total) by mouth daily., Disp: 30 tablet, Rfl: 5 .  thiamine 100 MG tablet, Take 1 tablet (100 mg total) by mouth daily., Disp: 30  tablet, Rfl: 0 .  torsemide (DEMADEX) 20 MG tablet, Take 4 tablets (80 mg total) by mouth daily., Disp: 120 tablet, Rfl: 5 .  traMADol (ULTRAM) 50 MG tablet, 1-2 tabs po tid prn pain, Disp: 42 tablet, Rfl: 0  EXAM:  VITALS per patient if applicable:  GENERAL: alert, oriented, appears well and in no acute distress  HEENT: atraumatic, conjunttiva clear, no obvious abnormalities on inspection of external nose and ears  NECK: normal movements of the head and neck  LUNGS: on inspection no signs of respiratory distress, breathing rate appears normal, no obvious gross SOB, gasping or wheezing  CV: no obvious cyanosis  MS: moves all visible extremities without noticeable abnormality  PSYCH/NEURO: pleasant and cooperative, no obvious depression or anxiety, speech and thought processing grossly intact  LABS: none today    Chemistry      Component Value Date/Time   NA 138 10/25/2018 0922   NA 141 12/13/2017   K 4.5 10/25/2018 0922   CL 93 (L) 10/25/2018 0922   CO2 38 (H) 10/25/2018 0922   BUN 41 (H) 10/25/2018 0922   BUN 26 (A) 12/13/2017   CREATININE 1.52 (H) 10/25/2018 0922   GLU 220 12/13/2017      Component Value Date/Time   CALCIUM 9.7 10/25/2018 0922   ALKPHOS 87 10/25/2018 0922   AST 20 10/25/2018 0922   ALT 23 10/25/2018 0922   BILITOT 0.3 10/25/2018 0922     Lab Results  Component Value Date   WBC 13.7 (H) 06/10/2018   HGB 12.4 (L) 06/10/2018   HCT 40.2 06/10/2018   MCV 92.8 06/10/2018   PLT 218 06/10/2018   Lab Results  Component Value Date   HGBA1C 10.8 (H) 10/25/2018   Lab Results  Component Value Date   TSH 2.13 01/19/2018   Lab Results  Component Value Date   CHOL 140 11/12/2017   HDL 37 (L) 11/12/2017   LDLCALC 53 11/12/2017   TRIG 252 (H) 11/12/2017   CHOLHDL 3.8  11/12/2017    ASSESSMENT AND PLAN:  Discussed the following assessment and plan:  No diagnosis found.    ? X-ray. ? Aortic u/s?  I discussed the assessment and treatment  plan with the patient. The patient was provided an opportunity to ask questions and all were answered. The patient agreed with the plan and demonstrated an understanding of the instructions.   The patient was advised to call back or seek an in-person evaluation if the symptoms worsen or if the condition fails to improve as anticipated.  F/u: ***  Signed:  Crissie Sickles, MD           11/23/2018

## 2018-11-25 ENCOUNTER — Telehealth (INDEPENDENT_AMBULATORY_CARE_PROVIDER_SITE_OTHER): Payer: Medicare Other | Admitting: Neurology

## 2018-11-25 ENCOUNTER — Other Ambulatory Visit: Payer: Self-pay

## 2018-11-25 DIAGNOSIS — F0390 Unspecified dementia without behavioral disturbance: Secondary | ICD-10-CM

## 2018-11-25 DIAGNOSIS — F03B0 Unspecified dementia, moderate, without behavioral disturbance, psychotic disturbance, mood disturbance, and anxiety: Secondary | ICD-10-CM
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
## 2018-11-28 ENCOUNTER — Ambulatory Visit (INDEPENDENT_AMBULATORY_CARE_PROVIDER_SITE_OTHER): Payer: Medicare Other | Admitting: Family Medicine

## 2018-11-28 ENCOUNTER — Encounter: Payer: Self-pay | Admitting: Family Medicine

## 2018-11-28 ENCOUNTER — Other Ambulatory Visit: Payer: Self-pay

## 2018-11-28 VITALS — BP 131/67 | HR 60 | Wt 160.0 lb

## 2018-11-28 DIAGNOSIS — M545 Low back pain, unspecified: Secondary | ICD-10-CM

## 2018-11-28 DIAGNOSIS — E1165 Type 2 diabetes mellitus with hyperglycemia: Secondary | ICD-10-CM | POA: Diagnosis not present

## 2018-11-28 NOTE — Progress Notes (Signed)
Virtual Visit via Video Note  I connected with pt on 11/28/18 at  4:00 PM EDT by a video enabled telemedicine application and verified that I am speaking with the correct person using two identifiers.  Location patient: home Location provider:work or home office Persons participating in the virtual visit: patient, his daughter, and myself.  I discussed the limitations of evaluation and management by telemedicine and the availability of in person appointments. The patient expressed understanding and agreed to proceed.   HPI: 73 y/o WM being seen today for 3 week f/u recent acute musculoskeletal low back pain. Tramadol rx'd and he took it only a couple days.  Family talked him into getting more active and this has made a big positive difference. PT rx'd and they went to his home for eval but then never got back with the pt or his daughter ([facility]). He's no longer complaining of low back pain much at all, and he has not been taking tramadol since the first couple of days he got the med.  Not sleeping as much or sitting around in his chair as much..  Doing more ambulation and being more active. Wearing oxygen on and off.  Daughter says he acts no different off oxyg vs on it. Wt stable at 160 lbs. Glucose currently 303.  Fastings tend to be 150s.    ROS: no fevers, no cough, no n/v, no abd pain, no SOB.  Past Medical History:  Diagnosis Date  . AICD (automatic cardioverter/defibrillator) present   . Alcohol abuse   . Anxiety   . Bipolar disorder ([place])   . Biventricular automatic implantable cardioverter defibrillator in situ    a. 01/2014 s/p MDT [id number] Viva XT CRT-D (ser # [id number]).  . CAD (coronary artery disease)    a. s/p prior PCI/stenting of the LCX;  b. 08/2014 MV: EF 20%, large septal, apical, and inferior infarct from apex to base, no ischemia;  c. 08/2014 NSTEMI/Cath: LM mod distal dzs extending into ostial LCX (80%), LAD tortuous, RI nl, OM mod dzs, RCA 100 CTO with R->R and L->R  collats-->Med Rx.  . CHF (congestive heart failure) ([place])   . Chronic atrial fibrillation    a. CHA2DS2VASc = 6->eliquis;  b. S/P AVN RFCA an BiV ICD placement.  . Chronic renal insufficiency, stage 3 (moderate) (HCC) 2020   GFR low 40s (estimated baseline sCr 1.5)  . Chronic respiratory failure (HCC)    hypoxic and hypercarbic  . Chronic systolic congestive heart failure ([place])    a. 01/2014 Echo: EF 25-30%, mod conc LVH, mod dil LA.  Marland Kitchen Complete heart block ([place])    a. In setting of prior AV nodal ablation r/t afib-->BiV ICD (01/2014).  . COPD (chronic obstructive pulmonary disease) ([place])   . CVA (cerebral vascular accident) ([employer])    a. Multiple prior embolic strokes.  . Dementia ([place])   . Depression   . Diabetes mellitus ([place])   . DVT (deep venous thrombosis) ([place])   . Hypertension   . Mitral valve disease    a. remote mitral replacement with Bjork Shiley valve;  b. 06/2006 Redo MVR with tissue valve.  . Mixed Ischemic and Nonischemic Cardiomyopathy    a. 8/.2015 Echo: Ef 25-30%;  b. 01/2014 s/p MDT [id number] Pershing Proud XT CRT-D (ser # [id number]).  . On home oxygen therapy    "2L; 24/7" (65/16/2019)  . Psoriasis    "back of his head; elbows; finger of left hand" (06/07/2017)  . Pure hypercholesterolemia   .  S/P AV nodal ablation   . Tobacco dependence    current as of 07/2018  . Ventral hernia     Past Surgical History:  Procedure Laterality Date  . AV NODE ABLATION  2007  . CARDIAC CATHETERIZATION  06/2006   Mild ostial L main stenosis, CFX stent patent, RCA occluded (old)  . CORONARY ARTERY BYPASS GRAFT  2008  . CYSTOSCOPY W/ URETERAL STENT PLACEMENT  07/12/2011   Procedure: CYSTOSCOPY WITH RETROGRADE PYELOGRAM/URETERAL STENT PLACEMENT;  Surgeon: Alexis Frock, MD;  Location: [facility];  Service: Urology;  Laterality: Left;  . ECHO DOPPLER COMPLETE(TRANSTHOR) ([facility] HX)  04/07/2017   LV EF: 25%-30%  . EXPLORATORY LAPAROTOMY    . HERNIA REPAIR    . ICD GENERATOR CHANGE  02/01/2014   Gen  change to: Medtronic VIVA pulse generator, serial number [id number]  . INSERT / REPLACE / REMOVE PACEMAKER    . LEFT HEART CATHETERIZATION WITH CORONARY ANGIOGRAM N/A 08/23/2014   Procedure: LEFT HEART CATHETERIZATION WITH CORONARY ANGIOGRAM;  Surgeon: Jettie Booze, MD;  Location[facility] C[facility] LAB;  Service: Cardiovascular;  Laterality: N/A;  . MITRAL VALVE REPLACEMENT     remote [facility] valve 8325 with redo tissue valve 06/2006  . PACEMAKER GENERATOR CHANGE N/A 02/01/2014   Procedure: PACEMAKER GENERATOR CHANGE;  Surgeon: Deboraha Sprang, MD;  Location: [facility] CATH LAB;  Service: Cardiovascular;  Laterality: N/A;  . PACEMAKER PLACEMENT  2006   Changed to CRT-D in 2009  . RIGHT/LEFT HEART CATH AND CORONARY ANGIOGRAPHY N/A 06/09/2017   Procedure: RIGHT/LEFT HEART CATH AND CORONARY ANGIOGRAPHY;  Surgeon: Larey Dresser, MD;  Location: [facility] CV LAB;  Service: Cardiovascular;  Laterality: N/A;  . TRANSTHORACIC ECHOCARDIOGRAM  10/2017   EF 30-35%, inf-lat and inf akin, MV bioprosth ok, mod dec RV fxn, pulm HTN (peak 65).  Michelene Gardener      Family History  Problem Relation Age of Onset  . Alzheimer's disease Mother   . CAD Mother   . Heart attack Father   . Heart disease Father   . CAD Father   . Heart attack Son   . Varicose Veins Son   . Deep vein thrombosis Son   . Heart disease Son   . Stroke Other   . Heart disease Other   . Heart disease Brother   . CAD Brother      Current Outpatient Medications:  .  ACCU-CHEK GUIDE test strip, TEST TWICE A DAY, Disp: 100 each, Rfl: 0 .  arformoterol (BROVANA) 15 MCG/2ML NEBU, Take 2 mLs (15 mcg total) by nebulization 2 (two) times daily for 15 days., Disp: 120 mL, Rfl: 1 .  atorvastatin (LIPITOR) 40 MG tablet, Take 1 tablet (40 mg total) by mouth daily at 6 PM., Disp: 90 tablet, Rfl: 2 .  blood glucose meter kit and supplies, Use to check blood sugar twice daily, Disp: 1 each, Rfl: 0 .  budesonide (PULMICORT) 0.5 MG/2ML nebulizer  solution, Take 2 mLs (0.5 mg total) by nebulization 2 (two) times daily. DX: J44.9, Disp: 120 mL, Rfl: 4 .  carvedilol (COREG) 6.25 MG tablet, Take 1 tablet (6.25 mg total) by mouth 2 (two) times daily with a meal., Disp: 180 tablet, Rfl: 2 .  digoxin (LANOXIN) 0.125 MG tablet, Take 1 tablet (0.125 mg total) by mouth daily., Disp: 90 tablet, Rfl: 2 .  ELIQUIS 5 MG TABS tablet, Take 1 tablet (5 mg total) by mouth 2 (two) times daily., Disp: 180 tablet, Rfl: 3 .  fluticasone (CUTIVATE) 0.05 % cream, Apply topically 2 (two) times daily. Dispense largest tube available please., Disp: 60 g, Rfl: 6 .  glipiZIDE (GLUCOTROL XL) 2.5 MG 24 hr tablet, Take 2 tab every morning and 1 tab every evening, Disp: , Rfl:  .  Insulin Glargine (LANTUS SOLOSTAR) 100 UNIT/ML Solostar Pen, 12 units SQ qhs, Disp: 5 pen, Rfl: 6 .  Insulin Pen Needle 31G X 6 MM MISC, Use to inject insulin at bedtime, Disp: 100 each, Rfl: 5 .  ipratropium-albuterol (DUONEB) 0.5-2.5 (3) MG/3ML SOLN, Take 3 mLs by nebulization every 4 (four) hours as needed., Disp: 360 mL, Rfl: 0 .  JARDIANCE 10 MG TABS tablet, Take 10 mg by mouth daily., Disp: 30 tablet, Rfl: 4 .  lactulose (CHRONULAC) 10 GM/15ML solution, TAKE 30 MILLILITERS BY MOUTH TWICE A DAY (Patient taking differently: TAKE 45 MILLILITERS BY MOUTH daily), Disp: 1800 mL, Rfl: 6 .  loratadine (CLARITIN) 10 MG tablet, Take 1 tablet (10 mg total) by mouth daily., Disp: 30 tablet, Rfl: 1 .  losartan (COZAAR) 25 MG tablet, Take 1 tablet (25 mg total) by mouth daily., Disp: 30 tablet, Rfl: 5 .  Multiple Vitamin (MULTIVITAMIN WITH MINERALS) TABS tablet, Take 1 tablet by mouth daily., Disp: 30 tablet, Rfl: 2 .  nitroGLYCERIN (NITROSTAT) 0.4 MG SL tablet, Place 1 tablet (0.4 mg total) under the tongue every 5 (five) minutes as needed for chest pain., Disp: 25 tablet, Rfl: 2 .  OXYGEN, Inhale 2.5 L into the lungs continuous. , Disp: , Rfl:  .  spironolactone (ALDACTONE) 25 MG tablet, Take 1 tablet  (25 mg total) by mouth daily., Disp: 30 tablet, Rfl: 5 .  thiamine 100 MG tablet, Take 1 tablet (100 mg total) by mouth daily., Disp: 30 tablet, Rfl: 0 .  torsemide (DEMADEX) 20 MG tablet, Take 4 tablets (80 mg total) by mouth daily., Disp: 120 tablet, Rfl: 5 .  traMADol (ULTRAM) 50 MG tablet, 1-2 tabs po tid prn pain (Patient not taking: Reported on 11/28/2018), Disp: 42 tablet, Rfl: 0  EXAM:  VITALS per patient if applicable: BP 182/99 (BP Location: Left Arm, Patient Position: Sitting, Cuff Size: Normal)   Pulse 60   Wt 160 lb (72.6 kg)   BMI 25.82 kg/m    GENERAL: alert, oriented, appears well and in no acute distress  HEENT: atraumatic, conjunttiva clear, no obvious abnormalities on inspection of external nose and ears  NECK: normal movements of the head and neck  LUNGS: on inspection no signs of respiratory distress, breathing rate appears normal, no obvious gross SOB, gasping or wheezing  CV: no obvious cyanosis  MS: moves all visible extremities without noticeable abnormality  PSYCH/NEURO: pleasant and cooperative, no obvious depression or anxiety, speech and thought processing grossly intact  LABS: none today  Lab Results  Component Value Date   HGBA1C 10.8 (H) 10/25/2018     Chemistry      Component Value Date/Time   NA 138 10/25/2018 0922   NA 141 12/13/2017   K 4.5 10/25/2018 0922   CL 93 (L) 10/25/2018 0922   CO2 38 (H) 10/25/2018 0922   BUN 41 (H) 10/25/2018 0922   BUN 26 (A) 12/13/2017   CREATININE 1.52 (H) 10/25/2018 0922   GLU 220 12/13/2017      Component Value Date/Time   CALCIUM 9.7 10/25/2018 0922   ALKPHOS 87 10/25/2018 0922   AST 20 10/25/2018 0922   ALT 23 10/25/2018 0922   BILITOT 0.3 10/25/2018 3716  Lab Results  Component Value Date   WBC 13.7 (H) 06/10/2018   HGB 12.4 (L) 06/10/2018   HCT 40.2 06/10/2018   MCV 92.8 06/10/2018   PLT 218 06/10/2018   Lab Results  Component Value Date   CHOL 140 11/12/2017   HDL 37 (L)  11/12/2017   LDLCALC 53 11/12/2017   TRIG 252 (H) 11/12/2017   CHOLHDL 3.8 11/12/2017    ASSESSMENT AND PLAN:  Discussed the following assessment and plan:  1) Acute musculoskeletal LBP: much improved, but due to his debilitated condition I think this will be a relapsing condition in the near future if he doesn't get some PT.  He has tramadol to take prn. Will figure out what's going on with [facility] PT situation.  2) DM 2: poor control.  Pt and daughter seem to have hard time grasping that his control is NOT good and that he DOES need to be on insulin.  Tried to do some more diabetes education today.  I recommended he restart lantus 10 U qhs and daughter and pt were agreeable to this.  I discussed the assessment and treatment plan with the patient. The patient was provided an opportunity to ask questions and all were answered. The patient agreed with the plan and demonstrated an understanding of the instructions.   The patient was advised to call back or seek an in-person evaluation if the symptoms worsen or if the condition fails to improve as anticipated.  F/u: 2 mo RCI  Signed:  Crissie Sickles, MD           11/28/2018

## 2018-11-29 ENCOUNTER — Encounter: Payer: Self-pay | Admitting: Cardiology

## 2018-11-29 NOTE — Telephone Encounter (Signed)
Spoke with Melissa at [facility]. Staff will reach out to patient to schedule at home PT, will continue 2/week per previous orders.

## 2018-11-29 NOTE — Progress Notes (Signed)
Remote ICD transmission.   

## 2018-11-29 NOTE — Telephone Encounter (Signed)
Great. Thx.

## 2018-12-06 DIAGNOSIS — E1122 Type 2 diabetes mellitus with diabetic chronic kidney disease: Secondary | ICD-10-CM | POA: Diagnosis not present

## 2018-12-06 DIAGNOSIS — Z9181 History of falling: Secondary | ICD-10-CM | POA: Diagnosis not present

## 2018-12-06 DIAGNOSIS — M545 Low back pain: Secondary | ICD-10-CM | POA: Diagnosis not present

## 2018-12-06 DIAGNOSIS — I5022 Chronic systolic (congestive) heart failure: Secondary | ICD-10-CM | POA: Diagnosis not present

## 2018-12-06 DIAGNOSIS — J9621 Acute and chronic respiratory failure with hypoxia: Secondary | ICD-10-CM | POA: Diagnosis not present

## 2018-12-06 DIAGNOSIS — I130 Hypertensive heart and chronic kidney disease with heart failure and stage 1 through stage 4 chronic kidney disease, or unspecified chronic kidney disease: Secondary | ICD-10-CM | POA: Diagnosis not present

## 2018-12-06 DIAGNOSIS — Z9981 Dependence on supplemental oxygen: Secondary | ICD-10-CM | POA: Diagnosis not present

## 2018-12-06 DIAGNOSIS — I482 Chronic atrial fibrillation, unspecified: Secondary | ICD-10-CM | POA: Diagnosis not present

## 2018-12-06 DIAGNOSIS — I2510 Atherosclerotic heart disease of native coronary artery without angina pectoris: Secondary | ICD-10-CM | POA: Diagnosis not present

## 2018-12-06 DIAGNOSIS — Z8673 Personal history of transient ischemic attack (TIA), and cerebral infarction without residual deficits: Secondary | ICD-10-CM | POA: Diagnosis not present

## 2018-12-06 DIAGNOSIS — E785 Hyperlipidemia, unspecified: Secondary | ICD-10-CM | POA: Diagnosis not present

## 2018-12-06 DIAGNOSIS — Z794 Long term (current) use of insulin: Secondary | ICD-10-CM | POA: Diagnosis not present

## 2018-12-06 DIAGNOSIS — Z95810 Presence of automatic (implantable) cardiac defibrillator: Secondary | ICD-10-CM | POA: Diagnosis not present

## 2018-12-06 DIAGNOSIS — Z7901 Long term (current) use of anticoagulants: Secondary | ICD-10-CM | POA: Diagnosis not present

## 2018-12-06 DIAGNOSIS — J449 Chronic obstructive pulmonary disease, unspecified: Secondary | ICD-10-CM | POA: Diagnosis not present

## 2018-12-06 DIAGNOSIS — I428 Other cardiomyopathies: Secondary | ICD-10-CM | POA: Diagnosis not present

## 2018-12-06 DIAGNOSIS — I252 Old myocardial infarction: Secondary | ICD-10-CM | POA: Diagnosis not present

## 2018-12-06 DIAGNOSIS — Z86718 Personal history of other venous thrombosis and embolism: Secondary | ICD-10-CM | POA: Diagnosis not present

## 2018-12-06 DIAGNOSIS — N183 Chronic kidney disease, stage 3 (moderate): Secondary | ICD-10-CM | POA: Diagnosis not present

## 2018-12-06 DIAGNOSIS — J9622 Acute and chronic respiratory failure with hypercapnia: Secondary | ICD-10-CM | POA: Diagnosis not present

## 2018-12-07 ENCOUNTER — Telehealth: Payer: Self-pay | Admitting: Neurology

## 2018-12-07 NOTE — Telephone Encounter (Signed)
Would make sure with his other doctors that his medications are okay. Are the staring spells on and off for a few minutes then he is back to his usual self? Does he respond when called? Would do an EEG, pls let Manuela Schwartz know if she can do it this week. Thanks

## 2018-12-07 NOTE — Telephone Encounter (Addendum)
Staring into space, not himself, just not there. Has no motivation, tired all of the time. Whites of eyes are red. Thinks may be related to his injectable diabetic med or other meds that cause drowsiness.

## 2018-12-07 NOTE — Telephone Encounter (Signed)
Pt has will stare off for about 30 minutes at a time per daughter. She is very concerned that all is related to insulin and other meds. I instructed her to contact PCP who manages diabetic meds and also pts cardio to discuss. She agreed. Staff message sent to [place] to schedule EEG for Monday with Manuela Schwartz.

## 2018-12-07 NOTE — Telephone Encounter (Signed)
Patient daughter wants to speak to someone about how her father is responding to things. It is like he does not understand things.

## 2018-12-07 NOTE — Telephone Encounter (Signed)
Pls see additional questions on my other message, we would not be able to do an EEG until Monday, if she can wait until then.

## 2018-12-08 ENCOUNTER — Telehealth: Payer: Self-pay

## 2018-12-08 DIAGNOSIS — E785 Hyperlipidemia, unspecified: Secondary | ICD-10-CM | POA: Diagnosis not present

## 2018-12-08 DIAGNOSIS — Z86718 Personal history of other venous thrombosis and embolism: Secondary | ICD-10-CM

## 2018-12-08 DIAGNOSIS — N183 Chronic kidney disease, stage 3 (moderate): Secondary | ICD-10-CM

## 2018-12-08 DIAGNOSIS — Z9181 History of falling: Secondary | ICD-10-CM

## 2018-12-08 DIAGNOSIS — I428 Other cardiomyopathies: Secondary | ICD-10-CM

## 2018-12-08 DIAGNOSIS — J9621 Acute and chronic respiratory failure with hypoxia: Secondary | ICD-10-CM

## 2018-12-08 DIAGNOSIS — F17200 Nicotine dependence, unspecified, uncomplicated: Secondary | ICD-10-CM

## 2018-12-08 DIAGNOSIS — I2510 Atherosclerotic heart disease of native coronary artery without angina pectoris: Secondary | ICD-10-CM | POA: Diagnosis not present

## 2018-12-08 DIAGNOSIS — Z95810 Presence of automatic (implantable) cardiac defibrillator: Secondary | ICD-10-CM | POA: Diagnosis not present

## 2018-12-08 DIAGNOSIS — I5022 Chronic systolic (congestive) heart failure: Secondary | ICD-10-CM | POA: Diagnosis not present

## 2018-12-08 DIAGNOSIS — I130 Hypertensive heart and chronic kidney disease with heart failure and stage 1 through stage 4 chronic kidney disease, or unspecified chronic kidney disease: Secondary | ICD-10-CM | POA: Diagnosis not present

## 2018-12-08 DIAGNOSIS — J9622 Acute and chronic respiratory failure with hypercapnia: Secondary | ICD-10-CM | POA: Diagnosis not present

## 2018-12-08 DIAGNOSIS — M545 Low back pain: Secondary | ICD-10-CM | POA: Diagnosis not present

## 2018-12-08 DIAGNOSIS — I252 Old myocardial infarction: Secondary | ICD-10-CM | POA: Diagnosis not present

## 2018-12-08 DIAGNOSIS — E1122 Type 2 diabetes mellitus with diabetic chronic kidney disease: Secondary | ICD-10-CM | POA: Diagnosis not present

## 2018-12-08 DIAGNOSIS — Z794 Long term (current) use of insulin: Secondary | ICD-10-CM

## 2018-12-08 DIAGNOSIS — I482 Chronic atrial fibrillation, unspecified: Secondary | ICD-10-CM | POA: Diagnosis not present

## 2018-12-08 DIAGNOSIS — Z9981 Dependence on supplemental oxygen: Secondary | ICD-10-CM | POA: Diagnosis not present

## 2018-12-08 DIAGNOSIS — F319 Bipolar disorder, unspecified: Secondary | ICD-10-CM

## 2018-12-08 DIAGNOSIS — F419 Anxiety disorder, unspecified: Secondary | ICD-10-CM

## 2018-12-08 DIAGNOSIS — Z7901 Long term (current) use of anticoagulants: Secondary | ICD-10-CM | POA: Diagnosis not present

## 2018-12-08 DIAGNOSIS — J449 Chronic obstructive pulmonary disease, unspecified: Secondary | ICD-10-CM | POA: Diagnosis not present

## 2018-12-08 DIAGNOSIS — F0390 Unspecified dementia without behavioral disturbance: Secondary | ICD-10-CM

## 2018-12-08 DIAGNOSIS — Z8673 Personal history of transient ischemic attack (TIA), and cerebral infarction without residual deficits: Secondary | ICD-10-CM

## 2018-12-08 NOTE — Telephone Encounter (Signed)
Received [facility] certification forms and plan of care. Given to provider to review and sign.

## 2018-12-08 NOTE — Telephone Encounter (Signed)
Signed and put in box to go up front.  

## 2018-12-12 DIAGNOSIS — I252 Old myocardial infarction: Secondary | ICD-10-CM | POA: Diagnosis not present

## 2018-12-12 DIAGNOSIS — I2510 Atherosclerotic heart disease of native coronary artery without angina pectoris: Secondary | ICD-10-CM | POA: Diagnosis not present

## 2018-12-12 DIAGNOSIS — J9621 Acute and chronic respiratory failure with hypoxia: Secondary | ICD-10-CM | POA: Diagnosis not present

## 2018-12-12 DIAGNOSIS — J9622 Acute and chronic respiratory failure with hypercapnia: Secondary | ICD-10-CM | POA: Diagnosis not present

## 2018-12-12 DIAGNOSIS — I482 Chronic atrial fibrillation, unspecified: Secondary | ICD-10-CM | POA: Diagnosis not present

## 2018-12-12 DIAGNOSIS — J449 Chronic obstructive pulmonary disease, unspecified: Secondary | ICD-10-CM | POA: Diagnosis not present

## 2018-12-12 DIAGNOSIS — E1122 Type 2 diabetes mellitus with diabetic chronic kidney disease: Secondary | ICD-10-CM | POA: Diagnosis not present

## 2018-12-12 DIAGNOSIS — I428 Other cardiomyopathies: Secondary | ICD-10-CM | POA: Diagnosis not present

## 2018-12-12 DIAGNOSIS — I130 Hypertensive heart and chronic kidney disease with heart failure and stage 1 through stage 4 chronic kidney disease, or unspecified chronic kidney disease: Secondary | ICD-10-CM | POA: Diagnosis not present

## 2018-12-12 DIAGNOSIS — Z9981 Dependence on supplemental oxygen: Secondary | ICD-10-CM | POA: Diagnosis not present

## 2018-12-12 DIAGNOSIS — Z86718 Personal history of other venous thrombosis and embolism: Secondary | ICD-10-CM | POA: Diagnosis not present

## 2018-12-12 DIAGNOSIS — Z9181 History of falling: Secondary | ICD-10-CM | POA: Diagnosis not present

## 2018-12-12 DIAGNOSIS — Z95810 Presence of automatic (implantable) cardiac defibrillator: Secondary | ICD-10-CM | POA: Diagnosis not present

## 2018-12-12 DIAGNOSIS — Z8673 Personal history of transient ischemic attack (TIA), and cerebral infarction without residual deficits: Secondary | ICD-10-CM | POA: Diagnosis not present

## 2018-12-12 DIAGNOSIS — N183 Chronic kidney disease, stage 3 (moderate): Secondary | ICD-10-CM | POA: Diagnosis not present

## 2018-12-12 DIAGNOSIS — I5022 Chronic systolic (congestive) heart failure: Secondary | ICD-10-CM | POA: Diagnosis not present

## 2018-12-12 DIAGNOSIS — M545 Low back pain: Secondary | ICD-10-CM | POA: Diagnosis not present

## 2018-12-12 DIAGNOSIS — E785 Hyperlipidemia, unspecified: Secondary | ICD-10-CM | POA: Diagnosis not present

## 2018-12-12 DIAGNOSIS — Z794 Long term (current) use of insulin: Secondary | ICD-10-CM | POA: Diagnosis not present

## 2018-12-12 DIAGNOSIS — Z7901 Long term (current) use of anticoagulants: Secondary | ICD-10-CM | POA: Diagnosis not present

## 2018-12-14 ENCOUNTER — Other Ambulatory Visit: Payer: Medicare Other

## 2018-12-14 DIAGNOSIS — M545 Low back pain: Secondary | ICD-10-CM | POA: Diagnosis not present

## 2018-12-14 DIAGNOSIS — Z794 Long term (current) use of insulin: Secondary | ICD-10-CM | POA: Diagnosis not present

## 2018-12-14 DIAGNOSIS — N183 Chronic kidney disease, stage 3 (moderate): Secondary | ICD-10-CM | POA: Diagnosis not present

## 2018-12-14 DIAGNOSIS — E785 Hyperlipidemia, unspecified: Secondary | ICD-10-CM | POA: Diagnosis not present

## 2018-12-14 DIAGNOSIS — I428 Other cardiomyopathies: Secondary | ICD-10-CM | POA: Diagnosis not present

## 2018-12-14 DIAGNOSIS — E1122 Type 2 diabetes mellitus with diabetic chronic kidney disease: Secondary | ICD-10-CM | POA: Diagnosis not present

## 2018-12-14 DIAGNOSIS — Z9981 Dependence on supplemental oxygen: Secondary | ICD-10-CM | POA: Diagnosis not present

## 2018-12-14 DIAGNOSIS — J9622 Acute and chronic respiratory failure with hypercapnia: Secondary | ICD-10-CM | POA: Diagnosis not present

## 2018-12-14 DIAGNOSIS — Z95810 Presence of automatic (implantable) cardiac defibrillator: Secondary | ICD-10-CM | POA: Diagnosis not present

## 2018-12-14 DIAGNOSIS — I2510 Atherosclerotic heart disease of native coronary artery without angina pectoris: Secondary | ICD-10-CM | POA: Diagnosis not present

## 2018-12-14 DIAGNOSIS — J449 Chronic obstructive pulmonary disease, unspecified: Secondary | ICD-10-CM | POA: Diagnosis not present

## 2018-12-14 DIAGNOSIS — J9621 Acute and chronic respiratory failure with hypoxia: Secondary | ICD-10-CM | POA: Diagnosis not present

## 2018-12-14 DIAGNOSIS — I130 Hypertensive heart and chronic kidney disease with heart failure and stage 1 through stage 4 chronic kidney disease, or unspecified chronic kidney disease: Secondary | ICD-10-CM | POA: Diagnosis not present

## 2018-12-14 DIAGNOSIS — Z8673 Personal history of transient ischemic attack (TIA), and cerebral infarction without residual deficits: Secondary | ICD-10-CM | POA: Diagnosis not present

## 2018-12-14 DIAGNOSIS — Z7901 Long term (current) use of anticoagulants: Secondary | ICD-10-CM | POA: Diagnosis not present

## 2018-12-14 DIAGNOSIS — Z86718 Personal history of other venous thrombosis and embolism: Secondary | ICD-10-CM | POA: Diagnosis not present

## 2018-12-14 DIAGNOSIS — I252 Old myocardial infarction: Secondary | ICD-10-CM | POA: Diagnosis not present

## 2018-12-14 DIAGNOSIS — I5022 Chronic systolic (congestive) heart failure: Secondary | ICD-10-CM | POA: Diagnosis not present

## 2018-12-14 DIAGNOSIS — I482 Chronic atrial fibrillation, unspecified: Secondary | ICD-10-CM | POA: Diagnosis not present

## 2018-12-14 DIAGNOSIS — Z9181 History of falling: Secondary | ICD-10-CM | POA: Diagnosis not present

## 2018-12-16 DIAGNOSIS — I5043 Acute on chronic combined systolic (congestive) and diastolic (congestive) heart failure: Secondary | ICD-10-CM | POA: Diagnosis not present

## 2018-12-16 DIAGNOSIS — S2239XA Fracture of one rib, unspecified side, initial encounter for closed fracture: Secondary | ICD-10-CM | POA: Diagnosis not present

## 2018-12-16 DIAGNOSIS — J9690 Respiratory failure, unspecified, unspecified whether with hypoxia or hypercapnia: Secondary | ICD-10-CM | POA: Diagnosis not present

## 2018-12-16 DIAGNOSIS — J449 Chronic obstructive pulmonary disease, unspecified: Secondary | ICD-10-CM | POA: Diagnosis not present

## 2018-12-19 ENCOUNTER — Other Ambulatory Visit: Payer: Self-pay

## 2018-12-19 ENCOUNTER — Ambulatory Visit (INDEPENDENT_AMBULATORY_CARE_PROVIDER_SITE_OTHER): Payer: Medicare Other | Admitting: Neurology

## 2018-12-19 DIAGNOSIS — R404 Transient alteration of awareness: Secondary | ICD-10-CM

## 2018-12-19 DIAGNOSIS — E785 Hyperlipidemia, unspecified: Secondary | ICD-10-CM | POA: Diagnosis not present

## 2018-12-19 DIAGNOSIS — Z794 Long term (current) use of insulin: Secondary | ICD-10-CM | POA: Diagnosis not present

## 2018-12-19 DIAGNOSIS — Z8673 Personal history of transient ischemic attack (TIA), and cerebral infarction without residual deficits: Secondary | ICD-10-CM | POA: Diagnosis not present

## 2018-12-19 DIAGNOSIS — J9621 Acute and chronic respiratory failure with hypoxia: Secondary | ICD-10-CM | POA: Diagnosis not present

## 2018-12-19 DIAGNOSIS — J9622 Acute and chronic respiratory failure with hypercapnia: Secondary | ICD-10-CM | POA: Diagnosis not present

## 2018-12-19 DIAGNOSIS — F03B0 Unspecified dementia, moderate, without behavioral disturbance, psychotic disturbance, mood disturbance, and anxiety: Secondary | ICD-10-CM

## 2018-12-19 DIAGNOSIS — F0390 Unspecified dementia without behavioral disturbance: Secondary | ICD-10-CM | POA: Diagnosis not present

## 2018-12-19 DIAGNOSIS — I2510 Atherosclerotic heart disease of native coronary artery without angina pectoris: Secondary | ICD-10-CM | POA: Diagnosis not present

## 2018-12-19 DIAGNOSIS — E1122 Type 2 diabetes mellitus with diabetic chronic kidney disease: Secondary | ICD-10-CM | POA: Diagnosis not present

## 2018-12-19 DIAGNOSIS — Z9981 Dependence on supplemental oxygen: Secondary | ICD-10-CM | POA: Diagnosis not present

## 2018-12-19 DIAGNOSIS — I130 Hypertensive heart and chronic kidney disease with heart failure and stage 1 through stage 4 chronic kidney disease, or unspecified chronic kidney disease: Secondary | ICD-10-CM | POA: Diagnosis not present

## 2018-12-19 DIAGNOSIS — I482 Chronic atrial fibrillation, unspecified: Secondary | ICD-10-CM | POA: Diagnosis not present

## 2018-12-19 DIAGNOSIS — J449 Chronic obstructive pulmonary disease, unspecified: Secondary | ICD-10-CM | POA: Diagnosis not present

## 2018-12-19 DIAGNOSIS — N183 Chronic kidney disease, stage 3 (moderate): Secondary | ICD-10-CM | POA: Diagnosis not present

## 2018-12-19 DIAGNOSIS — Z95810 Presence of automatic (implantable) cardiac defibrillator: Secondary | ICD-10-CM | POA: Diagnosis not present

## 2018-12-19 DIAGNOSIS — I5022 Chronic systolic (congestive) heart failure: Secondary | ICD-10-CM | POA: Diagnosis not present

## 2018-12-19 DIAGNOSIS — M545 Low back pain: Secondary | ICD-10-CM | POA: Diagnosis not present

## 2018-12-19 DIAGNOSIS — I428 Other cardiomyopathies: Secondary | ICD-10-CM | POA: Diagnosis not present

## 2018-12-19 DIAGNOSIS — Z7901 Long term (current) use of anticoagulants: Secondary | ICD-10-CM | POA: Diagnosis not present

## 2018-12-19 DIAGNOSIS — Z86718 Personal history of other venous thrombosis and embolism: Secondary | ICD-10-CM | POA: Diagnosis not present

## 2018-12-19 DIAGNOSIS — Z9181 History of falling: Secondary | ICD-10-CM | POA: Diagnosis not present

## 2018-12-19 DIAGNOSIS — I252 Old myocardial infarction: Secondary | ICD-10-CM | POA: Diagnosis not present

## 2018-12-19 NOTE — Progress Notes (Signed)
e

## 2018-12-21 DIAGNOSIS — Z7901 Long term (current) use of anticoagulants: Secondary | ICD-10-CM | POA: Diagnosis not present

## 2018-12-21 DIAGNOSIS — N183 Chronic kidney disease, stage 3 (moderate): Secondary | ICD-10-CM | POA: Diagnosis not present

## 2018-12-21 DIAGNOSIS — J449 Chronic obstructive pulmonary disease, unspecified: Secondary | ICD-10-CM | POA: Diagnosis not present

## 2018-12-21 DIAGNOSIS — Z9981 Dependence on supplemental oxygen: Secondary | ICD-10-CM | POA: Diagnosis not present

## 2018-12-21 DIAGNOSIS — E785 Hyperlipidemia, unspecified: Secondary | ICD-10-CM | POA: Diagnosis not present

## 2018-12-21 DIAGNOSIS — I130 Hypertensive heart and chronic kidney disease with heart failure and stage 1 through stage 4 chronic kidney disease, or unspecified chronic kidney disease: Secondary | ICD-10-CM | POA: Diagnosis not present

## 2018-12-21 DIAGNOSIS — J9621 Acute and chronic respiratory failure with hypoxia: Secondary | ICD-10-CM | POA: Diagnosis not present

## 2018-12-21 DIAGNOSIS — I2510 Atherosclerotic heart disease of native coronary artery without angina pectoris: Secondary | ICD-10-CM | POA: Diagnosis not present

## 2018-12-21 DIAGNOSIS — Z9181 History of falling: Secondary | ICD-10-CM | POA: Diagnosis not present

## 2018-12-21 DIAGNOSIS — J9622 Acute and chronic respiratory failure with hypercapnia: Secondary | ICD-10-CM | POA: Diagnosis not present

## 2018-12-21 DIAGNOSIS — I482 Chronic atrial fibrillation, unspecified: Secondary | ICD-10-CM | POA: Diagnosis not present

## 2018-12-21 DIAGNOSIS — E1122 Type 2 diabetes mellitus with diabetic chronic kidney disease: Secondary | ICD-10-CM | POA: Diagnosis not present

## 2018-12-21 DIAGNOSIS — Z86718 Personal history of other venous thrombosis and embolism: Secondary | ICD-10-CM | POA: Diagnosis not present

## 2018-12-21 DIAGNOSIS — Z794 Long term (current) use of insulin: Secondary | ICD-10-CM | POA: Diagnosis not present

## 2018-12-21 DIAGNOSIS — Z95810 Presence of automatic (implantable) cardiac defibrillator: Secondary | ICD-10-CM | POA: Diagnosis not present

## 2018-12-21 DIAGNOSIS — I5022 Chronic systolic (congestive) heart failure: Secondary | ICD-10-CM | POA: Diagnosis not present

## 2018-12-21 DIAGNOSIS — M545 Low back pain: Secondary | ICD-10-CM | POA: Diagnosis not present

## 2018-12-21 DIAGNOSIS — I252 Old myocardial infarction: Secondary | ICD-10-CM | POA: Diagnosis not present

## 2018-12-21 DIAGNOSIS — I428 Other cardiomyopathies: Secondary | ICD-10-CM | POA: Diagnosis not present

## 2018-12-21 DIAGNOSIS — Z8673 Personal history of transient ischemic attack (TIA), and cerebral infarction without residual deficits: Secondary | ICD-10-CM | POA: Diagnosis not present

## 2018-12-22 DIAGNOSIS — J9690 Respiratory failure, unspecified, unspecified whether with hypoxia or hypercapnia: Secondary | ICD-10-CM | POA: Diagnosis not present

## 2018-12-22 DIAGNOSIS — S2239XA Fracture of one rib, unspecified side, initial encounter for closed fracture: Secondary | ICD-10-CM | POA: Diagnosis not present

## 2018-12-22 DIAGNOSIS — I5043 Acute on chronic combined systolic (congestive) and diastolic (congestive) heart failure: Secondary | ICD-10-CM | POA: Diagnosis not present

## 2018-12-22 DIAGNOSIS — J449 Chronic obstructive pulmonary disease, unspecified: Secondary | ICD-10-CM | POA: Diagnosis not present

## 2018-12-22 NOTE — Procedures (Signed)
ELECTROENCEPHALOGRAM REPORT  Date of Study: 12/19/2018  Patient's Name: [name] MRN: [id number] Date of Birth: [birth year]  Referring Provider: Dr. Ellouise Newer  Clinical History: This is a 73 year old man with dementia, prior history of abnormal EEG, with report of staring episodes, not responding.   Medications: DEMADEX 20 MG tablet LIPITOR 40 MG tablet PULMICORT 0.5 MG/2ML nebulizer solution COREG) 6.25 MG tablet LANOXIN 0.125 MG tablet ELIQUIS 5 MG TABS tablet CUTIVATE 0.05 % cream GLUCOTROL XL 2.5 MG 24 hr tablet LANTUS SOLOSTAR 100 UNIT/ML DUONEB 0.5-2.5 (3) MG/3ML SOLN JARDIANCE 10 MG TABS tablet CHRONULAC 10 GM/15ML solution CLARITIN 10 MG tablet COZAAR 25 MG tablet MULTIVITAMIN WITH MINERALS TABS NITROSTAT 0.4 MG SL tablet OXYGEN nhale 2.5 L into the lungs continuous ALDACTONE 25 MG tablet thiamine 100 MG tablet  Technical Summary: A multichannel digital EEG recording measured by the international 10-20 system with electrodes applied with paste and impedances below 5000 ohms performed as portable with EKG monitoring in an awake and asleep patient.  Hyperventilation was not performed. Photic stimulation was performed.  The digital EEG was referentially recorded, reformatted, and digitally filtered in a variety of bipolar and referential montages for optimal display.   Description: The patient is awake and asleep during the recording.  During maximal wakefulness, there is a symmetric, medium voltage 7 Hz posterior dominant rhythm that attenuates with eye opening. The record is symmetric. During drowsiness and stage I sleep, there is an increase in theta and delta slowing of the background with rare vertex waves seen.  Photic stimulation did not elicit any abnormalities.  There were no epileptiform discharges or electrographic seizures seen.    EKG lead was unremarkable.  Impression: This awake and asleep EEG is abnormal due to slowing of the posterior dominant  rhythm.   Clinical Correlation of the above findings indicates diffuse cerebral dysfunction that is non-specific in etiology and can be seen with hypoxic/ischemic injury, toxic/metabolic encephalopathies, neurodegenerative disorders, or medication effect.  The absence of epileptiform discharges does not rule out a clinical diagnosis of epilepsy.  Clinical correlation is advised.   Ellouise Newer, M.D.

## 2018-12-22 NOTE — Telephone Encounter (Signed)
Spoke with patients daughter and pt will let us know about the following test further on the EEG.

## 2018-12-27 DIAGNOSIS — Z794 Long term (current) use of insulin: Secondary | ICD-10-CM | POA: Diagnosis not present

## 2018-12-27 DIAGNOSIS — I5022 Chronic systolic (congestive) heart failure: Secondary | ICD-10-CM | POA: Diagnosis not present

## 2018-12-27 DIAGNOSIS — J449 Chronic obstructive pulmonary disease, unspecified: Secondary | ICD-10-CM | POA: Diagnosis not present

## 2018-12-27 DIAGNOSIS — Z7901 Long term (current) use of anticoagulants: Secondary | ICD-10-CM | POA: Diagnosis not present

## 2018-12-27 DIAGNOSIS — M545 Low back pain: Secondary | ICD-10-CM | POA: Diagnosis not present

## 2018-12-27 DIAGNOSIS — I252 Old myocardial infarction: Secondary | ICD-10-CM | POA: Diagnosis not present

## 2018-12-27 DIAGNOSIS — I2510 Atherosclerotic heart disease of native coronary artery without angina pectoris: Secondary | ICD-10-CM | POA: Diagnosis not present

## 2018-12-27 DIAGNOSIS — E785 Hyperlipidemia, unspecified: Secondary | ICD-10-CM | POA: Diagnosis not present

## 2018-12-27 DIAGNOSIS — Z9181 History of falling: Secondary | ICD-10-CM | POA: Diagnosis not present

## 2018-12-27 DIAGNOSIS — Z9981 Dependence on supplemental oxygen: Secondary | ICD-10-CM | POA: Diagnosis not present

## 2018-12-27 DIAGNOSIS — N183 Chronic kidney disease, stage 3 (moderate): Secondary | ICD-10-CM | POA: Diagnosis not present

## 2018-12-27 DIAGNOSIS — Z8673 Personal history of transient ischemic attack (TIA), and cerebral infarction without residual deficits: Secondary | ICD-10-CM | POA: Diagnosis not present

## 2018-12-27 DIAGNOSIS — J9621 Acute and chronic respiratory failure with hypoxia: Secondary | ICD-10-CM | POA: Diagnosis not present

## 2018-12-27 DIAGNOSIS — I130 Hypertensive heart and chronic kidney disease with heart failure and stage 1 through stage 4 chronic kidney disease, or unspecified chronic kidney disease: Secondary | ICD-10-CM | POA: Diagnosis not present

## 2018-12-27 DIAGNOSIS — Z95810 Presence of automatic (implantable) cardiac defibrillator: Secondary | ICD-10-CM | POA: Diagnosis not present

## 2018-12-27 DIAGNOSIS — I482 Chronic atrial fibrillation, unspecified: Secondary | ICD-10-CM | POA: Diagnosis not present

## 2018-12-27 DIAGNOSIS — Z86718 Personal history of other venous thrombosis and embolism: Secondary | ICD-10-CM | POA: Diagnosis not present

## 2018-12-27 DIAGNOSIS — E1122 Type 2 diabetes mellitus with diabetic chronic kidney disease: Secondary | ICD-10-CM | POA: Diagnosis not present

## 2018-12-27 DIAGNOSIS — I428 Other cardiomyopathies: Secondary | ICD-10-CM | POA: Diagnosis not present

## 2018-12-27 DIAGNOSIS — J9622 Acute and chronic respiratory failure with hypercapnia: Secondary | ICD-10-CM | POA: Diagnosis not present

## 2018-12-30 DIAGNOSIS — I252 Old myocardial infarction: Secondary | ICD-10-CM | POA: Diagnosis not present

## 2018-12-30 DIAGNOSIS — I130 Hypertensive heart and chronic kidney disease with heart failure and stage 1 through stage 4 chronic kidney disease, or unspecified chronic kidney disease: Secondary | ICD-10-CM | POA: Diagnosis not present

## 2018-12-30 DIAGNOSIS — J449 Chronic obstructive pulmonary disease, unspecified: Secondary | ICD-10-CM | POA: Diagnosis not present

## 2018-12-30 DIAGNOSIS — Z95810 Presence of automatic (implantable) cardiac defibrillator: Secondary | ICD-10-CM | POA: Diagnosis not present

## 2018-12-30 DIAGNOSIS — Z8673 Personal history of transient ischemic attack (TIA), and cerebral infarction without residual deficits: Secondary | ICD-10-CM | POA: Diagnosis not present

## 2018-12-30 DIAGNOSIS — N183 Chronic kidney disease, stage 3 (moderate): Secondary | ICD-10-CM | POA: Diagnosis not present

## 2018-12-30 DIAGNOSIS — I5022 Chronic systolic (congestive) heart failure: Secondary | ICD-10-CM | POA: Diagnosis not present

## 2018-12-30 DIAGNOSIS — Z7901 Long term (current) use of anticoagulants: Secondary | ICD-10-CM | POA: Diagnosis not present

## 2018-12-30 DIAGNOSIS — Z86718 Personal history of other venous thrombosis and embolism: Secondary | ICD-10-CM | POA: Diagnosis not present

## 2018-12-30 DIAGNOSIS — Z9181 History of falling: Secondary | ICD-10-CM | POA: Diagnosis not present

## 2018-12-30 DIAGNOSIS — J9621 Acute and chronic respiratory failure with hypoxia: Secondary | ICD-10-CM | POA: Diagnosis not present

## 2018-12-30 DIAGNOSIS — E785 Hyperlipidemia, unspecified: Secondary | ICD-10-CM | POA: Diagnosis not present

## 2018-12-30 DIAGNOSIS — I2510 Atherosclerotic heart disease of native coronary artery without angina pectoris: Secondary | ICD-10-CM | POA: Diagnosis not present

## 2018-12-30 DIAGNOSIS — I428 Other cardiomyopathies: Secondary | ICD-10-CM | POA: Diagnosis not present

## 2018-12-30 DIAGNOSIS — Z794 Long term (current) use of insulin: Secondary | ICD-10-CM | POA: Diagnosis not present

## 2018-12-30 DIAGNOSIS — E1122 Type 2 diabetes mellitus with diabetic chronic kidney disease: Secondary | ICD-10-CM | POA: Diagnosis not present

## 2018-12-30 DIAGNOSIS — M545 Low back pain: Secondary | ICD-10-CM | POA: Diagnosis not present

## 2018-12-30 DIAGNOSIS — I482 Chronic atrial fibrillation, unspecified: Secondary | ICD-10-CM | POA: Diagnosis not present

## 2018-12-30 DIAGNOSIS — J9622 Acute and chronic respiratory failure with hypercapnia: Secondary | ICD-10-CM | POA: Diagnosis not present

## 2018-12-30 DIAGNOSIS — Z9981 Dependence on supplemental oxygen: Secondary | ICD-10-CM | POA: Diagnosis not present

## 2019-01-04 ENCOUNTER — Telehealth: Payer: Self-pay

## 2019-01-04 DIAGNOSIS — J9621 Acute and chronic respiratory failure with hypoxia: Secondary | ICD-10-CM | POA: Diagnosis not present

## 2019-01-04 DIAGNOSIS — I482 Chronic atrial fibrillation, unspecified: Secondary | ICD-10-CM | POA: Diagnosis not present

## 2019-01-04 DIAGNOSIS — N183 Chronic kidney disease, stage 3 (moderate): Secondary | ICD-10-CM | POA: Diagnosis not present

## 2019-01-04 DIAGNOSIS — E785 Hyperlipidemia, unspecified: Secondary | ICD-10-CM | POA: Diagnosis not present

## 2019-01-04 DIAGNOSIS — Z7901 Long term (current) use of anticoagulants: Secondary | ICD-10-CM | POA: Diagnosis not present

## 2019-01-04 DIAGNOSIS — Z95810 Presence of automatic (implantable) cardiac defibrillator: Secondary | ICD-10-CM | POA: Diagnosis not present

## 2019-01-04 DIAGNOSIS — J449 Chronic obstructive pulmonary disease, unspecified: Secondary | ICD-10-CM | POA: Diagnosis not present

## 2019-01-04 DIAGNOSIS — Z794 Long term (current) use of insulin: Secondary | ICD-10-CM | POA: Diagnosis not present

## 2019-01-04 DIAGNOSIS — Z9981 Dependence on supplemental oxygen: Secondary | ICD-10-CM | POA: Diagnosis not present

## 2019-01-04 DIAGNOSIS — J9622 Acute and chronic respiratory failure with hypercapnia: Secondary | ICD-10-CM | POA: Diagnosis not present

## 2019-01-04 DIAGNOSIS — I2510 Atherosclerotic heart disease of native coronary artery without angina pectoris: Secondary | ICD-10-CM | POA: Diagnosis not present

## 2019-01-04 DIAGNOSIS — M545 Low back pain: Secondary | ICD-10-CM | POA: Diagnosis not present

## 2019-01-04 DIAGNOSIS — E1122 Type 2 diabetes mellitus with diabetic chronic kidney disease: Secondary | ICD-10-CM | POA: Diagnosis not present

## 2019-01-04 DIAGNOSIS — Z8673 Personal history of transient ischemic attack (TIA), and cerebral infarction without residual deficits: Secondary | ICD-10-CM | POA: Diagnosis not present

## 2019-01-04 DIAGNOSIS — I252 Old myocardial infarction: Secondary | ICD-10-CM | POA: Diagnosis not present

## 2019-01-04 DIAGNOSIS — Z86718 Personal history of other venous thrombosis and embolism: Secondary | ICD-10-CM | POA: Diagnosis not present

## 2019-01-04 DIAGNOSIS — Z9181 History of falling: Secondary | ICD-10-CM | POA: Diagnosis not present

## 2019-01-04 DIAGNOSIS — I428 Other cardiomyopathies: Secondary | ICD-10-CM | POA: Diagnosis not present

## 2019-01-04 DIAGNOSIS — I130 Hypertensive heart and chronic kidney disease with heart failure and stage 1 through stage 4 chronic kidney disease, or unspecified chronic kidney disease: Secondary | ICD-10-CM | POA: Diagnosis not present

## 2019-01-04 DIAGNOSIS — I5022 Chronic systolic (congestive) heart failure: Secondary | ICD-10-CM | POA: Diagnosis not present

## 2019-01-04 NOTE — Telephone Encounter (Signed)
Placed in the bin to go up front to be faxed.

## 2019-01-04 NOTE — Telephone Encounter (Signed)
Signed and placed on your desk.

## 2019-01-04 NOTE — Telephone Encounter (Signed)
Received HH orders to extend PT. PCP will review and sign, if appropriate.

## 2019-01-10 ENCOUNTER — Encounter: Payer: Self-pay | Admitting: Family Medicine

## 2019-01-11 DIAGNOSIS — Z9981 Dependence on supplemental oxygen: Secondary | ICD-10-CM | POA: Diagnosis not present

## 2019-01-11 DIAGNOSIS — J9621 Acute and chronic respiratory failure with hypoxia: Secondary | ICD-10-CM | POA: Diagnosis not present

## 2019-01-11 DIAGNOSIS — Z8673 Personal history of transient ischemic attack (TIA), and cerebral infarction without residual deficits: Secondary | ICD-10-CM | POA: Diagnosis not present

## 2019-01-11 DIAGNOSIS — I2510 Atherosclerotic heart disease of native coronary artery without angina pectoris: Secondary | ICD-10-CM | POA: Diagnosis not present

## 2019-01-11 DIAGNOSIS — I428 Other cardiomyopathies: Secondary | ICD-10-CM | POA: Diagnosis not present

## 2019-01-11 DIAGNOSIS — I252 Old myocardial infarction: Secondary | ICD-10-CM | POA: Diagnosis not present

## 2019-01-11 DIAGNOSIS — J449 Chronic obstructive pulmonary disease, unspecified: Secondary | ICD-10-CM | POA: Diagnosis not present

## 2019-01-11 DIAGNOSIS — I130 Hypertensive heart and chronic kidney disease with heart failure and stage 1 through stage 4 chronic kidney disease, or unspecified chronic kidney disease: Secondary | ICD-10-CM | POA: Diagnosis not present

## 2019-01-11 DIAGNOSIS — Z86718 Personal history of other venous thrombosis and embolism: Secondary | ICD-10-CM | POA: Diagnosis not present

## 2019-01-11 DIAGNOSIS — J9622 Acute and chronic respiratory failure with hypercapnia: Secondary | ICD-10-CM | POA: Diagnosis not present

## 2019-01-11 DIAGNOSIS — Z794 Long term (current) use of insulin: Secondary | ICD-10-CM | POA: Diagnosis not present

## 2019-01-11 DIAGNOSIS — I482 Chronic atrial fibrillation, unspecified: Secondary | ICD-10-CM | POA: Diagnosis not present

## 2019-01-11 DIAGNOSIS — Z7901 Long term (current) use of anticoagulants: Secondary | ICD-10-CM | POA: Diagnosis not present

## 2019-01-11 DIAGNOSIS — M545 Low back pain: Secondary | ICD-10-CM | POA: Diagnosis not present

## 2019-01-11 DIAGNOSIS — Z9181 History of falling: Secondary | ICD-10-CM | POA: Diagnosis not present

## 2019-01-11 DIAGNOSIS — I5022 Chronic systolic (congestive) heart failure: Secondary | ICD-10-CM | POA: Diagnosis not present

## 2019-01-11 DIAGNOSIS — Z95810 Presence of automatic (implantable) cardiac defibrillator: Secondary | ICD-10-CM | POA: Diagnosis not present

## 2019-01-11 DIAGNOSIS — N183 Chronic kidney disease, stage 3 (moderate): Secondary | ICD-10-CM | POA: Diagnosis not present

## 2019-01-11 DIAGNOSIS — E1122 Type 2 diabetes mellitus with diabetic chronic kidney disease: Secondary | ICD-10-CM | POA: Diagnosis not present

## 2019-01-11 DIAGNOSIS — E785 Hyperlipidemia, unspecified: Secondary | ICD-10-CM | POA: Diagnosis not present

## 2019-01-16 DIAGNOSIS — Z9981 Dependence on supplemental oxygen: Secondary | ICD-10-CM | POA: Diagnosis not present

## 2019-01-16 DIAGNOSIS — I5022 Chronic systolic (congestive) heart failure: Secondary | ICD-10-CM | POA: Diagnosis not present

## 2019-01-16 DIAGNOSIS — Z794 Long term (current) use of insulin: Secondary | ICD-10-CM | POA: Diagnosis not present

## 2019-01-16 DIAGNOSIS — Z9181 History of falling: Secondary | ICD-10-CM | POA: Diagnosis not present

## 2019-01-16 DIAGNOSIS — I428 Other cardiomyopathies: Secondary | ICD-10-CM | POA: Diagnosis not present

## 2019-01-16 DIAGNOSIS — Z7901 Long term (current) use of anticoagulants: Secondary | ICD-10-CM | POA: Diagnosis not present

## 2019-01-16 DIAGNOSIS — E785 Hyperlipidemia, unspecified: Secondary | ICD-10-CM | POA: Diagnosis not present

## 2019-01-16 DIAGNOSIS — N183 Chronic kidney disease, stage 3 (moderate): Secondary | ICD-10-CM | POA: Diagnosis not present

## 2019-01-16 DIAGNOSIS — M545 Low back pain: Secondary | ICD-10-CM | POA: Diagnosis not present

## 2019-01-16 DIAGNOSIS — I252 Old myocardial infarction: Secondary | ICD-10-CM | POA: Diagnosis not present

## 2019-01-16 DIAGNOSIS — S2239XA Fracture of one rib, unspecified side, initial encounter for closed fracture: Secondary | ICD-10-CM | POA: Diagnosis not present

## 2019-01-16 DIAGNOSIS — Z8673 Personal history of transient ischemic attack (TIA), and cerebral infarction without residual deficits: Secondary | ICD-10-CM | POA: Diagnosis not present

## 2019-01-16 DIAGNOSIS — I482 Chronic atrial fibrillation, unspecified: Secondary | ICD-10-CM | POA: Diagnosis not present

## 2019-01-16 DIAGNOSIS — J449 Chronic obstructive pulmonary disease, unspecified: Secondary | ICD-10-CM | POA: Diagnosis not present

## 2019-01-16 DIAGNOSIS — I5043 Acute on chronic combined systolic (congestive) and diastolic (congestive) heart failure: Secondary | ICD-10-CM | POA: Diagnosis not present

## 2019-01-16 DIAGNOSIS — I2510 Atherosclerotic heart disease of native coronary artery without angina pectoris: Secondary | ICD-10-CM | POA: Diagnosis not present

## 2019-01-16 DIAGNOSIS — I130 Hypertensive heart and chronic kidney disease with heart failure and stage 1 through stage 4 chronic kidney disease, or unspecified chronic kidney disease: Secondary | ICD-10-CM | POA: Diagnosis not present

## 2019-01-16 DIAGNOSIS — Z95810 Presence of automatic (implantable) cardiac defibrillator: Secondary | ICD-10-CM | POA: Diagnosis not present

## 2019-01-16 DIAGNOSIS — J9690 Respiratory failure, unspecified, unspecified whether with hypoxia or hypercapnia: Secondary | ICD-10-CM | POA: Diagnosis not present

## 2019-01-16 DIAGNOSIS — J9621 Acute and chronic respiratory failure with hypoxia: Secondary | ICD-10-CM | POA: Diagnosis not present

## 2019-01-16 DIAGNOSIS — E1122 Type 2 diabetes mellitus with diabetic chronic kidney disease: Secondary | ICD-10-CM | POA: Diagnosis not present

## 2019-01-16 DIAGNOSIS — Z86718 Personal history of other venous thrombosis and embolism: Secondary | ICD-10-CM | POA: Diagnosis not present

## 2019-01-16 DIAGNOSIS — J9622 Acute and chronic respiratory failure with hypercapnia: Secondary | ICD-10-CM | POA: Diagnosis not present

## 2019-01-22 DIAGNOSIS — J9690 Respiratory failure, unspecified, unspecified whether with hypoxia or hypercapnia: Secondary | ICD-10-CM | POA: Diagnosis not present

## 2019-01-22 DIAGNOSIS — J449 Chronic obstructive pulmonary disease, unspecified: Secondary | ICD-10-CM | POA: Diagnosis not present

## 2019-01-22 DIAGNOSIS — S2239XA Fracture of one rib, unspecified side, initial encounter for closed fracture: Secondary | ICD-10-CM | POA: Diagnosis not present

## 2019-01-22 DIAGNOSIS — I5043 Acute on chronic combined systolic (congestive) and diastolic (congestive) heart failure: Secondary | ICD-10-CM | POA: Diagnosis not present

## 2019-02-07 ENCOUNTER — Other Ambulatory Visit (HOSPITAL_COMMUNITY): Payer: Self-pay | Admitting: Cardiology

## 2019-02-16 DIAGNOSIS — J449 Chronic obstructive pulmonary disease, unspecified: Secondary | ICD-10-CM | POA: Diagnosis not present

## 2019-02-16 DIAGNOSIS — I5043 Acute on chronic combined systolic (congestive) and diastolic (congestive) heart failure: Secondary | ICD-10-CM | POA: Diagnosis not present

## 2019-02-16 DIAGNOSIS — S2239XA Fracture of one rib, unspecified side, initial encounter for closed fracture: Secondary | ICD-10-CM | POA: Diagnosis not present

## 2019-02-16 DIAGNOSIS — J9690 Respiratory failure, unspecified, unspecified whether with hypoxia or hypercapnia: Secondary | ICD-10-CM | POA: Diagnosis not present

## 2019-02-20 ENCOUNTER — Ambulatory Visit (INDEPENDENT_AMBULATORY_CARE_PROVIDER_SITE_OTHER): Payer: Medicare Other | Admitting: *Deleted

## 2019-02-20 DIAGNOSIS — I5022 Chronic systolic (congestive) heart failure: Secondary | ICD-10-CM

## 2019-02-20 DIAGNOSIS — I255 Ischemic cardiomyopathy: Secondary | ICD-10-CM | POA: Diagnosis not present

## 2019-02-21 LAB — CUP PACEART REMOTE DEVICE CHECK
Battery Remaining Longevity: 20 mo
Battery Voltage: 2.91 V
Brady Statistic AP VP Percent: 0 %
Brady Statistic AP VS Percent: 0 %
Brady Statistic AS VP Percent: 0 %
Brady Statistic AS VS Percent: 0 %
Brady Statistic RA Percent Paced: 0 %
Brady Statistic RV Percent Paced: 96.91 %
Date Time Interrogation Session: 20200914062823
HighPow Impedance: 47 Ohm
HighPow Impedance: 62 Ohm
Implantable Lead Implant Date: 20060307
Implantable Lead Implant Date: 20080110
Implantable Lead Implant Date: 20090617
Implantable Lead Location: 753858
Implantable Lead Location: 753859
Implantable Lead Location: 753860
Implantable Lead Model: 5071
Implantable Lead Model: 5076
Implantable Lead Model: 6947
Implantable Pulse Generator Implant Date: 20150827
Lead Channel Impedance Value: 399 Ohm
Lead Channel Impedance Value: 4047 Ohm
Lead Channel Impedance Value: 4047 Ohm
Lead Channel Impedance Value: 589 Ohm
Lead Channel Impedance Value: 703 Ohm
Lead Channel Impedance Value: 760 Ohm
Lead Channel Pacing Threshold Amplitude: 0.625 V
Lead Channel Pacing Threshold Amplitude: 0.75 V
Lead Channel Pacing Threshold Amplitude: 1.25 V
Lead Channel Pacing Threshold Pulse Width: 0.4 ms
Lead Channel Pacing Threshold Pulse Width: 0.4 ms
Lead Channel Pacing Threshold Pulse Width: 0.4 ms
Lead Channel Sensing Intrinsic Amplitude: 0.5 mV
Lead Channel Sensing Intrinsic Amplitude: 0.75 mV
Lead Channel Sensing Intrinsic Amplitude: 24.5 mV
Lead Channel Sensing Intrinsic Amplitude: 24.5 mV
Lead Channel Setting Pacing Amplitude: 2.5 V
Lead Channel Setting Pacing Amplitude: 2.5 V
Lead Channel Setting Pacing Pulse Width: 0.4 ms
Lead Channel Setting Pacing Pulse Width: 0.4 ms
Lead Channel Setting Sensing Sensitivity: 0.45 mV

## 2019-02-22 DIAGNOSIS — J9690 Respiratory failure, unspecified, unspecified whether with hypoxia or hypercapnia: Secondary | ICD-10-CM | POA: Diagnosis not present

## 2019-02-22 DIAGNOSIS — J449 Chronic obstructive pulmonary disease, unspecified: Secondary | ICD-10-CM | POA: Diagnosis not present

## 2019-02-22 DIAGNOSIS — S2239XA Fracture of one rib, unspecified side, initial encounter for closed fracture: Secondary | ICD-10-CM | POA: Diagnosis not present

## 2019-02-22 DIAGNOSIS — I5043 Acute on chronic combined systolic (congestive) and diastolic (congestive) heart failure: Secondary | ICD-10-CM | POA: Diagnosis not present

## 2019-03-03 ENCOUNTER — Encounter: Payer: Self-pay | Admitting: Cardiology

## 2019-03-03 NOTE — Progress Notes (Signed)
Remote ICD transmission.   

## 2019-03-04 ENCOUNTER — Other Ambulatory Visit (HOSPITAL_COMMUNITY): Payer: Self-pay | Admitting: Cardiology

## 2019-03-18 DIAGNOSIS — J9690 Respiratory failure, unspecified, unspecified whether with hypoxia or hypercapnia: Secondary | ICD-10-CM | POA: Diagnosis not present

## 2019-03-18 DIAGNOSIS — I5043 Acute on chronic combined systolic (congestive) and diastolic (congestive) heart failure: Secondary | ICD-10-CM | POA: Diagnosis not present

## 2019-03-18 DIAGNOSIS — J449 Chronic obstructive pulmonary disease, unspecified: Secondary | ICD-10-CM | POA: Diagnosis not present

## 2019-03-18 DIAGNOSIS — S2239XA Fracture of one rib, unspecified side, initial encounter for closed fracture: Secondary | ICD-10-CM | POA: Diagnosis not present

## 2019-03-24 DIAGNOSIS — J449 Chronic obstructive pulmonary disease, unspecified: Secondary | ICD-10-CM | POA: Diagnosis not present

## 2019-03-24 DIAGNOSIS — J9690 Respiratory failure, unspecified, unspecified whether with hypoxia or hypercapnia: Secondary | ICD-10-CM | POA: Diagnosis not present

## 2019-03-24 DIAGNOSIS — S2239XA Fracture of one rib, unspecified side, initial encounter for closed fracture: Secondary | ICD-10-CM | POA: Diagnosis not present

## 2019-03-24 DIAGNOSIS — I5043 Acute on chronic combined systolic (congestive) and diastolic (congestive) heart failure: Secondary | ICD-10-CM | POA: Diagnosis not present

## 2019-03-29 ENCOUNTER — Other Ambulatory Visit: Payer: Self-pay | Admitting: Family Medicine

## 2019-03-29 ENCOUNTER — Encounter: Payer: Self-pay | Admitting: Family Medicine

## 2019-03-29 ENCOUNTER — Telehealth: Payer: Self-pay | Admitting: Family Medicine

## 2019-03-29 MED ORDER — KETOCONAZOLE 2 % EX CREA: TOPICAL_CREAM | CUTANEOUS | 1 refills | Status: DC

## 2019-03-29 NOTE — Telephone Encounter (Signed)
His skin problem is one that will tend to wax and wane in it's severity, so sometimes the cream may not help as much as other times. I'll have him do some topical ketoconazole to the areas of skin involved for the next 2 wks.  Will eRx.

## 2019-03-29 NOTE — Telephone Encounter (Signed)
Patient's skin is flaking off so bad she is having to wipe his bed down. His legs are the worst. The cream he was prescribed is not helping. Please advise.

## 2019-03-29 NOTE — Telephone Encounter (Signed)
Patient uses cutivate 0.05% cream. Last refill 11/04/18 (60g,6). The cream is no longer helping. His last visit was 11/28/18.   Please advise, thanks.

## 2019-03-30 ENCOUNTER — Other Ambulatory Visit: Payer: Self-pay

## 2019-03-30 MED ORDER — LORATADINE 10 MG PO TABS: 10.0000 mg | ORAL_TABLET | Freq: Every day | ORAL | 1 refills | Status: DC

## 2019-03-30 MED ORDER — GLIPIZIDE ER 2.5 MG PO TB24: ORAL_TABLET | ORAL | 0 refills | Status: DC

## 2019-03-30 NOTE — Telephone Encounter (Signed)
Contacted pt's daughter to follow up for any other questions. They will try this cream for 2 weeks and call back if still not improving.

## 2019-03-30 NOTE — Telephone Encounter (Signed)
[name] (daughter of patient)called this morning regarding message to Dr. Anitra Lauth about cream not working for patient.  I read message to her from Dr. Anitra Lauth. Please contact daughter, she may have other questions regarding new prescription.  Thank you

## 2019-04-03 ENCOUNTER — Other Ambulatory Visit: Payer: Self-pay | Admitting: Cardiology

## 2019-04-05 ENCOUNTER — Other Ambulatory Visit: Payer: Self-pay | Admitting: Family Medicine

## 2019-04-18 DIAGNOSIS — J9690 Respiratory failure, unspecified, unspecified whether with hypoxia or hypercapnia: Secondary | ICD-10-CM | POA: Diagnosis not present

## 2019-04-18 DIAGNOSIS — I5043 Acute on chronic combined systolic (congestive) and diastolic (congestive) heart failure: Secondary | ICD-10-CM | POA: Diagnosis not present

## 2019-04-18 DIAGNOSIS — J449 Chronic obstructive pulmonary disease, unspecified: Secondary | ICD-10-CM | POA: Diagnosis not present

## 2019-04-18 DIAGNOSIS — S2239XA Fracture of one rib, unspecified side, initial encounter for closed fracture: Secondary | ICD-10-CM | POA: Diagnosis not present

## 2019-04-24 DIAGNOSIS — J9690 Respiratory failure, unspecified, unspecified whether with hypoxia or hypercapnia: Secondary | ICD-10-CM | POA: Diagnosis not present

## 2019-04-24 DIAGNOSIS — S2239XA Fracture of one rib, unspecified side, initial encounter for closed fracture: Secondary | ICD-10-CM | POA: Diagnosis not present

## 2019-04-24 DIAGNOSIS — J449 Chronic obstructive pulmonary disease, unspecified: Secondary | ICD-10-CM | POA: Diagnosis not present

## 2019-04-24 DIAGNOSIS — I5043 Acute on chronic combined systolic (congestive) and diastolic (congestive) heart failure: Secondary | ICD-10-CM | POA: Diagnosis not present

## 2019-05-01 ENCOUNTER — Telehealth: Payer: Self-pay | Admitting: Family Medicine

## 2019-05-01 DIAGNOSIS — L219 Seborrheic dermatitis, unspecified: Secondary | ICD-10-CM

## 2019-05-01 NOTE — Telephone Encounter (Signed)
Patient has so much skin flaking off of his head that his daughter is having to sweep the floor every day. She has been using the cream that was Rx but it isn't working. Patient is requesting an appointment for 05/02/19 if possible.

## 2019-05-01 NOTE — Telephone Encounter (Signed)
PCP schedule is full for 11/24 and 11/25 unless patient worked in. He was given Rx for Nizoral 2% cream (30g,1). He tried cutivate 0.05% cream before that.  Please advise, thanks.

## 2019-05-02 NOTE — Telephone Encounter (Signed)
I have nothing left to offer pt for this problem. I will refer to dermatologist if he is agreeable to this. Let me know.

## 2019-05-02 NOTE — Telephone Encounter (Signed)
Ok, derm referral ordered.

## 2019-05-02 NOTE — Addendum Note (Signed)
Addended by: Tammi Sou on: 05/02/2019 11:18 AM   Modules accepted: Orders

## 2019-05-02 NOTE — Telephone Encounter (Signed)
Pt's daughter, Donella Stade would like to proceed with dermatology referral.

## 2019-05-15 ENCOUNTER — Telehealth: Payer: Self-pay | Admitting: Family Medicine

## 2019-05-15 NOTE — Telephone Encounter (Signed)
Document purposes only; [name] daughter of [name], called regarding all 3 family members patients with exposure to [place]  Patient calls to report that her husband just tested positive for COVID.  She is very concerned about what to do.   She is concerned about her daughter is pregnant, lives in the same household, who is also a patient of Dr. Lucita Lora. She is very concerned of her elderly father whom lives in the home also, who is a patient of Dr. Idelle Leech.   (I have copied this note on all the patients listed here).   Please call her as soon as possible. Does she need to set up virtual appt(s) with Dr. Raoul Pitch? She wants to know what to disinfect the home,etc..  Please call [name] at [phone]

## 2019-05-15 NOTE — Telephone Encounter (Signed)
Please advise, thanks.

## 2019-05-15 NOTE — Telephone Encounter (Signed)
Nothing different than normal: wear masks around each other, social distance as much as possible. Wash hands frequently.  Monitor for signs/symptoms consistent with POSSIBLE covid: dry cough, fever, SOB, body aches and fatigue, loss of taste/smell. No need for covid testing. No need for medical visit as long as you are w/out symptoms. Reassure as best you can.-thx

## 2019-05-16 NOTE — Telephone Encounter (Signed)
LM for pt's daughter to return call.   

## 2019-05-18 DIAGNOSIS — S2239XA Fracture of one rib, unspecified side, initial encounter for closed fracture: Secondary | ICD-10-CM | POA: Diagnosis not present

## 2019-05-18 DIAGNOSIS — J9690 Respiratory failure, unspecified, unspecified whether with hypoxia or hypercapnia: Secondary | ICD-10-CM | POA: Diagnosis not present

## 2019-05-18 DIAGNOSIS — J449 Chronic obstructive pulmonary disease, unspecified: Secondary | ICD-10-CM | POA: Diagnosis not present

## 2019-05-18 DIAGNOSIS — I5043 Acute on chronic combined systolic (congestive) and diastolic (congestive) heart failure: Secondary | ICD-10-CM | POA: Diagnosis not present

## 2019-05-18 NOTE — Telephone Encounter (Signed)
FYI. Please see below.  Pt's daughter, Donella Stade was advised. She went for testing on 12/8 and awaiting results. Pt is quarantined away from other family members.

## 2019-05-22 ENCOUNTER — Ambulatory Visit (INDEPENDENT_AMBULATORY_CARE_PROVIDER_SITE_OTHER): Payer: Medicare Other | Admitting: *Deleted

## 2019-05-22 DIAGNOSIS — Z95810 Presence of automatic (implantable) cardiac defibrillator: Secondary | ICD-10-CM

## 2019-05-22 LAB — CUP PACEART REMOTE DEVICE CHECK
Battery Remaining Longevity: 19 mo
Battery Voltage: 2.9 V
Brady Statistic AP VP Percent: 0 %
Brady Statistic AP VS Percent: 0 %
Brady Statistic AS VP Percent: 0 %
Brady Statistic AS VS Percent: 0 %
Brady Statistic RA Percent Paced: 0 %
Brady Statistic RV Percent Paced: 95.25 %
Date Time Interrogation Session: 20201214033522
HighPow Impedance: 44 Ohm
HighPow Impedance: 57 Ohm
Implantable Lead Implant Date: 20060307
Implantable Lead Implant Date: 20080110
Implantable Lead Implant Date: 20090617
Implantable Lead Location: 753858
Implantable Lead Location: 753859
Implantable Lead Location: 753860
Implantable Lead Model: 5071
Implantable Lead Model: 5076
Implantable Lead Model: 6947
Implantable Pulse Generator Implant Date: 20150827
Lead Channel Impedance Value: 380 Ohm
Lead Channel Impedance Value: 4047 Ohm
Lead Channel Impedance Value: 4047 Ohm
Lead Channel Impedance Value: 494 Ohm
Lead Channel Impedance Value: 532 Ohm
Lead Channel Impedance Value: 589 Ohm
Lead Channel Pacing Threshold Amplitude: 0.625 V
Lead Channel Pacing Threshold Amplitude: 1 V
Lead Channel Pacing Threshold Amplitude: 1.125 V
Lead Channel Pacing Threshold Pulse Width: 0.4 ms
Lead Channel Pacing Threshold Pulse Width: 0.4 ms
Lead Channel Pacing Threshold Pulse Width: 0.4 ms
Lead Channel Sensing Intrinsic Amplitude: 0.5 mV
Lead Channel Sensing Intrinsic Amplitude: 0.75 mV
Lead Channel Sensing Intrinsic Amplitude: 24.5 mV
Lead Channel Sensing Intrinsic Amplitude: 24.5 mV
Lead Channel Setting Pacing Amplitude: 2.5 V
Lead Channel Setting Pacing Amplitude: 2.5 V
Lead Channel Setting Pacing Pulse Width: 0.4 ms
Lead Channel Setting Pacing Pulse Width: 0.4 ms
Lead Channel Setting Sensing Sensitivity: 0.45 mV

## 2019-05-24 DIAGNOSIS — J9690 Respiratory failure, unspecified, unspecified whether with hypoxia or hypercapnia: Secondary | ICD-10-CM | POA: Diagnosis not present

## 2019-05-24 DIAGNOSIS — I5043 Acute on chronic combined systolic (congestive) and diastolic (congestive) heart failure: Secondary | ICD-10-CM | POA: Diagnosis not present

## 2019-05-24 DIAGNOSIS — S2239XA Fracture of one rib, unspecified side, initial encounter for closed fracture: Secondary | ICD-10-CM | POA: Diagnosis not present

## 2019-05-24 DIAGNOSIS — J449 Chronic obstructive pulmonary disease, unspecified: Secondary | ICD-10-CM | POA: Diagnosis not present

## 2019-05-26 ENCOUNTER — Telehealth: Payer: Self-pay | Admitting: Family Medicine

## 2019-05-26 ENCOUNTER — Emergency Department (HOSPITAL_COMMUNITY)
Admission: EM | Admit: 2019-05-26 | Discharge: 2019-05-26 | Disposition: A | Discharge status: Home / Self Care | Payer: Medicare Other | Attending: Emergency Medicine | Admitting: Emergency Medicine

## 2019-05-26 ENCOUNTER — Encounter: Payer: Self-pay | Admitting: Family Medicine

## 2019-05-26 ENCOUNTER — Emergency Department (HOSPITAL_COMMUNITY): Payer: Medicare Other

## 2019-05-26 ENCOUNTER — Other Ambulatory Visit: Payer: Self-pay

## 2019-05-26 DIAGNOSIS — N183 Chronic kidney disease, stage 3 unspecified: Secondary | ICD-10-CM | POA: Insufficient documentation

## 2019-05-26 DIAGNOSIS — R0602 Shortness of breath: Secondary | ICD-10-CM | POA: Diagnosis not present

## 2019-05-26 DIAGNOSIS — Z7901 Long term (current) use of anticoagulants: Secondary | ICD-10-CM | POA: Insufficient documentation

## 2019-05-26 DIAGNOSIS — Z720 Tobacco use: Secondary | ICD-10-CM | POA: Insufficient documentation

## 2019-05-26 DIAGNOSIS — I5042 Chronic combined systolic (congestive) and diastolic (congestive) heart failure: Secondary | ICD-10-CM | POA: Insufficient documentation

## 2019-05-26 DIAGNOSIS — Z794 Long term (current) use of insulin: Secondary | ICD-10-CM | POA: Diagnosis not present

## 2019-05-26 DIAGNOSIS — R05 Cough: Secondary | ICD-10-CM | POA: Diagnosis not present

## 2019-05-26 DIAGNOSIS — I130 Hypertensive heart and chronic kidney disease with heart failure and stage 1 through stage 4 chronic kidney disease, or unspecified chronic kidney disease: Secondary | ICD-10-CM | POA: Insufficient documentation

## 2019-05-26 DIAGNOSIS — F0390 Unspecified dementia without behavioral disturbance: Secondary | ICD-10-CM | POA: Diagnosis not present

## 2019-05-26 DIAGNOSIS — Z95811 Presence of heart assist device: Secondary | ICD-10-CM | POA: Insufficient documentation

## 2019-05-26 DIAGNOSIS — E1122 Type 2 diabetes mellitus with diabetic chronic kidney disease: Secondary | ICD-10-CM | POA: Diagnosis not present

## 2019-05-26 DIAGNOSIS — E1165 Type 2 diabetes mellitus with hyperglycemia: Secondary | ICD-10-CM | POA: Diagnosis not present

## 2019-05-26 DIAGNOSIS — Z86718 Personal history of other venous thrombosis and embolism: Secondary | ICD-10-CM | POA: Insufficient documentation

## 2019-05-26 DIAGNOSIS — U071 COVID-19: Secondary | ICD-10-CM

## 2019-05-26 DIAGNOSIS — Z79899 Other long term (current) drug therapy: Secondary | ICD-10-CM | POA: Insufficient documentation

## 2019-05-26 DIAGNOSIS — J449 Chronic obstructive pulmonary disease, unspecified: Secondary | ICD-10-CM | POA: Diagnosis not present

## 2019-05-26 DIAGNOSIS — I4891 Unspecified atrial fibrillation: Secondary | ICD-10-CM | POA: Insufficient documentation

## 2019-05-26 DIAGNOSIS — Z743 Need for continuous supervision: Secondary | ICD-10-CM | POA: Diagnosis not present

## 2019-05-26 DIAGNOSIS — I959 Hypotension, unspecified: Secondary | ICD-10-CM | POA: Diagnosis not present

## 2019-05-26 HISTORY — DX: COVID-19: U07.1

## 2019-05-26 LAB — COMPREHENSIVE METABOLIC PANEL
ALT: 14 U/L (ref 0–44)
AST: 43 U/L — ABNORMAL HIGH (ref 15–41)
Albumin: 3.2 g/dL — ABNORMAL LOW (ref 3.5–5.0)
Alkaline Phosphatase: 67 U/L (ref 38–126)
Anion gap: 13 (ref 5–15)
BUN: 43 mg/dL — ABNORMAL HIGH (ref 8–23)
CO2: 24 mmol/L (ref 22–32)
Calcium: 8.3 mg/dL — ABNORMAL LOW (ref 8.9–10.3)
Chloride: 95 mmol/L — ABNORMAL LOW (ref 98–111)
Creatinine, Ser: 1.59 mg/dL — ABNORMAL HIGH (ref 0.61–1.24)
GFR calc Af Amer: 50 mL/min — ABNORMAL LOW (ref >60)
GFR calc non Af Amer: 43 mL/min — ABNORMAL LOW (ref >60)
Glucose, Bld: 394 mg/dL — ABNORMAL HIGH (ref 70–99)
Potassium: 5.8 mmol/L — ABNORMAL HIGH (ref 3.5–5.1)
Sodium: 132 mmol/L — ABNORMAL LOW (ref 135–145)
Total Bilirubin: 1.4 mg/dL — ABNORMAL HIGH (ref 0.3–1.2)
Total Protein: 7.1 g/dL (ref 6.5–8.1)

## 2019-05-26 LAB — URINALYSIS, ROUTINE W REFLEX MICROSCOPIC
Bacteria, UA: NONE SEEN
Bilirubin Urine: NEGATIVE
Glucose, UA: >=500 mg/dL — AB
Ketones, ur: NEGATIVE mg/dL
Leukocytes,Ua: NEGATIVE
Nitrite: NEGATIVE
Protein, ur: NEGATIVE mg/dL
Specific Gravity, Urine: 1.011 (ref 1.005–1.030)
pH: 6 (ref 5.0–8.0)

## 2019-05-26 LAB — POCT I-STAT EG7
Acid-Base Excess: 10 mmol/L — ABNORMAL HIGH (ref 0.0–2.0)
Bicarbonate: 35.8 mmol/L — ABNORMAL HIGH (ref 20.0–28.0)
Calcium, Ion: 1.05 mmol/L — ABNORMAL LOW (ref 1.15–1.40)
HCT: 38 % — ABNORMAL LOW (ref 39.0–52.0)
Hemoglobin: 12.9 g/dL — ABNORMAL LOW (ref 13.0–17.0)
O2 Saturation: 100 %
Potassium: 5.3 mmol/L — ABNORMAL HIGH (ref 3.5–5.1)
Sodium: 134 mmol/L — ABNORMAL LOW (ref 135–145)
TCO2: 37 mmol/L — ABNORMAL HIGH (ref 22–32)
pCO2, Ven: 52.5 mmHg (ref 44.0–60.0)
pH, Ven: 7.442 — ABNORMAL HIGH (ref 7.250–7.430)
pO2, Ven: 217 mmHg — ABNORMAL HIGH (ref 32.0–45.0)

## 2019-05-26 LAB — I-STAT CHEM 8, ED
BUN: 59 mg/dL — ABNORMAL HIGH (ref 8–23)
Calcium, Ion: 1.06 mmol/L — ABNORMAL LOW (ref 1.15–1.40)
Chloride: 96 mmol/L — ABNORMAL LOW (ref 98–111)
Creatinine, Ser: 1.4 mg/dL — ABNORMAL HIGH (ref 0.61–1.24)
Glucose, Bld: 363 mg/dL — ABNORMAL HIGH (ref 70–99)
HCT: 39 % (ref 39.0–52.0)
Hemoglobin: 13.3 g/dL (ref 13.0–17.0)
Potassium: 5.2 mmol/L — ABNORMAL HIGH (ref 3.5–5.1)
Sodium: 135 mmol/L (ref 135–145)
TCO2: 35 mmol/L — ABNORMAL HIGH (ref 22–32)

## 2019-05-26 LAB — CBC WITH DIFFERENTIAL/PLATELET
Abs Immature Granulocytes: 0.08 10*3/uL — ABNORMAL HIGH (ref 0.00–0.07)
Basophils Absolute: 0 10*3/uL (ref 0.0–0.1)
Basophils Relative: 0 %
Eosinophils Absolute: 0.1 10*3/uL (ref 0.0–0.5)
Eosinophils Relative: 1 %
HCT: 38.1 % — ABNORMAL LOW (ref 39.0–52.0)
Hemoglobin: 11.7 g/dL — ABNORMAL LOW (ref 13.0–17.0)
Immature Granulocytes: 1 %
Lymphocytes Relative: 12 %
Lymphs Abs: 1.2 10*3/uL (ref 0.7–4.0)
MCH: 28.3 pg (ref 26.0–34.0)
MCHC: 30.7 g/dL (ref 30.0–36.0)
MCV: 92 fL (ref 80.0–100.0)
Monocytes Absolute: 1.4 10*3/uL — ABNORMAL HIGH (ref 0.1–1.0)
Monocytes Relative: 14 %
Neutro Abs: 7.1 10*3/uL (ref 1.7–7.7)
Neutrophils Relative %: 72 %
Platelets: 171 10*3/uL (ref 150–400)
RBC: 4.14 MIL/uL — ABNORMAL LOW (ref 4.22–5.81)
RDW: 13.7 % (ref 11.5–15.5)
WBC: 10 10*3/uL (ref 4.0–10.5)
nRBC: 0 % (ref 0.0–0.2)

## 2019-05-26 LAB — BRAIN NATRIURETIC PEPTIDE: B Natriuretic Peptide: 188.8 pg/mL — ABNORMAL HIGH (ref 0.0–100.0)

## 2019-05-26 LAB — AMMONIA: Ammonia: 33 umol/L (ref 9–35)

## 2019-05-26 LAB — POC SARS CORONAVIRUS 2 AG -  ED: SARS Coronavirus 2 Ag: POSITIVE — AB

## 2019-05-26 MED ORDER — ALBUTEROL SULFATE HFA 108 (90 BASE) MCG/ACT IN AERS
2.0000 | INHALATION_SPRAY | Freq: Once | RESPIRATORY_TRACT | Status: AC
  Administered 2019-05-26: 2 via RESPIRATORY_TRACT
  Filled 2019-05-26: qty 6.7

## 2019-05-26 MED ORDER — SODIUM ZIRCONIUM CYCLOSILICATE 5 G PO PACK
5.0000 g | PACK | Freq: Once | ORAL | Status: AC
  Administered 2019-05-26: 5 g via ORAL
  Filled 2019-05-26: qty 1

## 2019-05-26 MED ORDER — INSULIN ASPART 100 UNIT/ML ~~LOC~~ SOLN
5.0000 [IU] | Freq: Once | SUBCUTANEOUS | Status: AC
  Administered 2019-05-26: 17:00:00 5 [IU] via SUBCUTANEOUS

## 2019-05-26 NOTE — ED Notes (Signed)
Patient verbalizes understanding of discharge instructions. Opportunity for questioning and answers were provided. Armband removed by staff, pt discharged from ED to home via POV  

## 2019-05-26 NOTE — ED Triage Notes (Signed)
Pt presents from home for cough, N/V, fever, decreased LOC, loss of smell, and SOB x4 days. Family called EMS< report 2 members of household are covid +. Pt normally wears 2L, given 3L by EMS to bring spO2 to 95%. Family provided neb tx and unknown dose of tylenol prior to arrival. H/o COPD, DM, CHF, HLD, HTN.  EMS exam- 12 lead:paced rhythm 84bpm, diminished bilat, RR 20, T97.46F, CBG 274

## 2019-05-26 NOTE — Telephone Encounter (Signed)
Patient's sugar 279 this morning. Patient started throwing up. Patient has been very tired. Patient's daughter called EMS, patient transported to [facility].

## 2019-05-26 NOTE — ED Provider Notes (Signed)
[facility]ERGENCY DEPARTMENT Provider Note   CSN: [id number] Arrival date & time: 05/26/19  1415     History Chief Complaint  Patient presents with  . Shortness of Breath    [name] is a 73 y.o. male.  Patient is a 73 year old male with a history of COPD with chronic respiratory failure on 2 L of oxygen, CHF, complete heart block status post pacemaker, prior CVA, dementia, alcohol abuse, diabetes and prior DVT who presents today with EMS from home.  Family called today for concern for Covid.  Patient per family report has had fever, cough, shortness of breath in the last few days.  Multiple family members at home have tested positive for Covid.  When paramedics arrived patient was standing 95% on 2 L and had just recently received a neb treatment.  He had no evidence of distress and has had no complaints in route.  He was not given any medication in route.  The history is provided by the patient.  Shortness of Breath      Past Medical History:  Diagnosis Date  . AICD (automatic cardioverter/defibrillator) present   . Alcohol abuse   . Anxiety   . Bipolar disorder ([place])   . Biventricular automatic implantable cardioverter defibrillator in situ    a. 01/2014 s/p MDT [id number] Viva XT CRT-D (ser # [id number]).  . CAD (coronary artery disease)    a. s/p prior PCI/stenting of the LCX;  b. 08/2014 MV: EF 20%, large septal, apical, and inferior infarct from apex to base, no ischemia;  c. 08/2014 NSTEMI/Cath: LM mod distal dzs extending into ostial LCX (80%), LAD tortuous, RI nl, OM mod dzs, RCA 100 CTO with R->R and L->R collats-->Med Rx.  . CHF (congestive heart failure) ([place])   . Chronic atrial fibrillation (HCC)    a. CHA2DS2VASc = 6->eliquis;  b. S/P AVN RFCA an BiV ICD placement.  . Chronic renal insufficiency, stage 3 (moderate) 2020   GFR low 40s (estimated baseline sCr 1.5)  . Chronic respiratory failure (HCC)    hypoxic and hypercarbic  . Chronic  systolic congestive heart failure ([place])    a. 01/2014 Echo: EF 25-30%, mod conc LVH, mod dil LA.  Marland Kitchen Complete heart block ([place])    a. In setting of prior AV nodal ablation r/t afib-->BiV ICD (01/2014).  . COPD (chronic obstructive pulmonary disease) ([place])   . CVA (cerebral vascular accident) ([place])    a. Multiple prior embolic strokes.  . Dementia ([place])    suspect vascular  . Depression   . Diabetes mellitus ([place])   . DVT (deep venous thrombosis) ([place])   . Hepatic steatosis    w/out imaging evidence of cirrhosis on u/s or CT scan (as of 01/2019).  +hx of elevated ammonia.  . Hypertension   . Mitral valve disease    a. remote mitral replacement with Bjork Shiley valve;  b. 06/2006 Redo MVR with tissue valve.  . Mixed Ischemic and Nonischemic Cardiomyopathy    a. 8/.2015 Echo: Ef 25-30%;  b. 01/2014 s/p MDT [id number] Pershing Proud XT CRT-D (ser # [id number]).  . On home oxygen therapy    "2L; 24/7" (65/16/2019)  . Psoriasis    "back of his head; elbows; finger of left hand" (06/07/2017)  . Pure hypercholesterolemia   . S/P AV nodal ablation   . Seborrheic dermatitis   . Tobacco dependence    current as of 07/2018  . Ventral hernia     Patient Active Problem  List   Diagnosis Date Noted  . Uncontrolled type 2 diabetes mellitus with hyperglycemia ([place]) 03/04/2018  . Acute on chronic respiratory failure with hypercapnia ([place])   . COPD with chronic bronchitis and emphysema ([place])   . Acute encephalopathy   . Acute respiratory failure ([place])   . Acute on chronic combined systolic (congestive) and diastolic (congestive) heart failure ([place]) 10/21/2017  . Chronic hypoxemic respiratory failure ([place]) 10/21/2017  . Bipolar disorder ([place]) 10/21/2017  . Chronic atrial fibrillation ([place]) 10/21/2017  . History of CVA (cerebrovascular accident) 10/21/2017  . COPD (chronic obstructive pulmonary disease) ([place]) 10/21/2017  . Tobacco abuse 10/21/2017  . History of alcohol use 10/21/2017  . CKD (chronic kidney  disease) stage 3, GFR 30-59 ml/min 10/21/2017  . Biventricular automatic implantable cardioverter defibrillator in situ 10/21/2017  . Dementia  10/21/2017  . History of mitral valve replacement 10/21/2017  . Coronary artery disease 10/21/2017  . Situational depression 10/21/2017  . Grief reaction 10/21/2017  . Chronic respiratory failure with hypoxia and hypercapnia ([place]) 06/22/2017  . CHF exacerbation ([place]) 06/06/2017  . Acute respiratory failure with hypoxia and hypercapnia (HCC)   . COPD GOLD 0 still smoking  03/02/2016  . Chronic kidney disease (CKD), stage II (mild) 11/23/2015  . CAD (coronary artery disease)   . Dementia ([place])   . Chronic respiratory failure with hypoxia ([place]) 10/11/2015  . Chronic systolic CHF (congestive heart failure) ([place]) 05/30/2015  . Cigarette smoker 12/24/2014  . Stroke ([place])   . Mitral valve disease   . Depression   . Mixed Ischemic and Nonischemic Cardiomyopathy   . Alcohol abuse   . Chronic anticoagulation 08/22/2014  . PVD (peripheral vascular disease) ([place]) 02/28/2014  . Mural thrombus of heart 02/07/2014  . Hypertension 06/14/2012  . Automatic implantable cardioverter-defibrillator in situ 04/04/2012  . Hyperlipidemia 10/27/2011  . Ventricular tachycardia ([place]) 07/14/2011  . Atrial fibrillation, chronic ([place]) 07/13/2011  . Right atrial thrombus ([place]) 07/11/2011    Past Surgical History:  Procedure Laterality Date  . AV NODE ABLATION  2007  . CARDIAC CATHETERIZATION  06/2006   Mild ostial L main stenosis, CFX stent patent, RCA occluded (old)  . CORONARY ARTERY BYPASS GRAFT  2008  . CYSTOSCOPY W/ URETERAL STENT PLACEMENT  07/12/2011   Procedure: CYSTOSCOPY WITH RETROGRADE PYELOGRAM/URETERAL STENT PLACEMENT;  Surgeon: Alexis Frock, MD;  Location: [facility];  Service: Urology;  Laterality: Left;  . ECHO DOPPLER COMPLETE(TRANSTHOR) ([facility] HX)  04/07/2017   LV EF: 25%-30%  . EEG  2019 and 12/22/18   2019 abnl->no seizures, not on seizure meds (as  of 01/2019).  Rpt EEG 12/2018 no epileptiform activity.  . EXPLORATORY LAPAROTOMY    . HERNIA REPAIR    . ICD GENERATOR CHANGE  02/01/2014   Gen change to: Medtronic VIVA pulse generator, serial number [id number]  . INSERT / REPLACE / REMOVE PACEMAKER    . LEFT HEART CATHETERIZATION WITH CORONARY ANGIOGRAM N/A 08/23/2014   Procedure: LEFT HEART CATHETERIZATION WITH CORONARY ANGIOGRAM;  Surgeon: Jettie Booze, MD;  Location: [facility] CATH LAB;  Service: Cardiovascular;  Laterality: N/A;  . MITRAL VALVE REPLACEMENT     remote [facility] valve 8546 with redo tissue valve 06/2006  . PACEMAKER GENERATOR CHANGE N/A 02/01/2014   Procedure: PACEMAKER GENERATOR CHANGE;  Surgeon: Deboraha Sprang, MD;  Location: [facility] CATH LAB;  Service: Cardiovascular;  Laterality: N/A;  . PACEMAKER PLACEMENT  2006   Changed to CRT-D in 2009  . RIGHT/LEFT HEART CATH AND CORONARY ANGIOGRAPHY N/A  06/09/2017   Procedure: RIGHT/LEFT HEART CATH AND CORONARY ANGIOGRAPHY;  Surgeon: Larey Dresser, MD;  Location: [facility] CV LAB;  Service: Cardiovascular;  Laterality: N/A;  . TRANSTHORACIC ECHOCARDIOGRAM  10/2017   EF 30-35%, inf-lat and inf akin, MV bioprosth ok, mod dec RV fxn, pulm HTN (peak 65).  Michelene Gardener         Family History  Problem Relation Age of Onset  . Alzheimer's disease Mother   . CAD Mother   . Heart attack Father   . Heart disease Father   . CAD Father   . Heart attack Son   . Varicose Veins Son   . Deep vein thrombosis Son   . Heart disease Son   . Stroke Other   . Heart disease Other   . Heart disease Brother   . CAD Brother     Social History   Tobacco Use  . Smoking status: Current Some Day Smoker    Packs/day: 2.00    Years: 45.00    Pack years: 90.00    Types: Cigarettes  . Smokeless tobacco: Never Used  . Tobacco comment: daughter states he cannot get to cigarettes now few cigarettes a day  Substance Use Topics  . Alcohol use: Not Currently    Alcohol/week: 21.0 standard  drinks    Types: 21 Cans of beer per week    Comment: 10/21/2017 "~ 2-3 beers/day"  . Drug use: No    Home Medications Prior to Admission medications   Medication Sig Start Date End Date Taking? Authorizing Provider  ACCU-CHEK GUIDE test strip TEST TWICE A DAY 09/01/18   McGowen, Adrian Blackwater, MD  arformoterol (BROVANA) 15 MCG/2ML NEBU Take 2 mLs (15 mcg total) by nebulization 2 (two) times daily for 15 days. 01/06/18 04/13/19  Georgiana Shore, NP  atorvastatin (LIPITOR) 40 MG tablet Take 1 tablet (40 mg total) by mouth daily at 6 PM. 11/09/18   Larey Dresser, MD  blood glucose meter kit and supplies Use to check blood sugar twice daily 07/19/18   McGowen, Adrian Blackwater, MD  budesonide (PULMICORT) 0.5 MG/2ML nebulizer solution Take 2 mLs (0.5 mg total) by nebulization 2 (two) times daily. DX: J44.9 06/13/18   Tanda Rockers, MD  carvedilol (COREG) 6.25 MG tablet Take 1 tablet (6.25 mg total) by mouth 2 (two) times daily with a meal. 11/09/18   Larey Dresser, MD  digoxin (LANOXIN) 0.125 MG tablet Take 1 tablet (0.125 mg total) by mouth daily. 11/09/18   Larey Dresser, MD  ELIQUIS 5 MG TABS tablet Take 1 tablet by mouth twice daily. 04/03/19   Larey Dresser, MD  fluticasone (CUTIVATE) 0.05 % cream Apply topically 2 (two) times daily. Dispense largest tube available please. 11/04/18   McGowen, Adrian Blackwater, MD  glipiZIDE (GLUCOTROL XL) 2.5 MG 24 hr tablet Take 2 tab every morning and 1 tab every evening 03/30/19   McGowen, Adrian Blackwater, MD  Insulin Glargine (LANTUS SOLOSTAR) 100 UNIT/ML Solostar Pen 12 units SQ qhs 11/04/18   McGowen, Adrian Blackwater, MD  Insulin Pen Needle 31G X 6 MM MISC Use to inject insulin at bedtime 08/17/18   McGowen, Adrian Blackwater, MD  ipratropium-albuterol (DUONEB) 0.5-2.5 (3) MG/3ML SOLN Take 3 mLs by nebulization every 4 (four) hours as needed. 11/03/17   Georgiana Shore, NP  JARDIANCE 10 MG TABS tablet Take 10 mg by mouth daily. 03/06/19   Larey Dresser, MD  ketoconazole (NIZORAL) 2 % cream Apply to  affected areas of skin once daily for 2 weeks 03/29/19   McGowen, Adrian Blackwater, MD  lactulose (CHRONULAC) 10 GM/15ML solution TAKE 30 MILLILITERS BY MOUTH TWICE A DAY Patient taking differently: TAKE 45 MILLILITERS BY MOUTH daily 04/12/18   Larey Dresser, MD  loratadine (CLARITIN) 10 MG tablet Take 1 tablet (10 mg total) by mouth daily. 03/30/19   McGowen, Adrian Blackwater, MD  losartan (COZAAR) 25 MG tablet Take 1 tablet (25 mg total) by mouth daily. 02/07/19   Larey Dresser, MD  Multiple Vitamin (MULTIVITAMIN WITH MINERALS) TABS tablet Take 1 tablet by mouth daily. 05/24/15   Bonnielee Haff, MD  nitroGLYCERIN (NITROSTAT) 0.4 MG SL tablet Place 1 tablet (0.4 mg total) under the tongue every 5 (five) minutes as needed for chest pain. 11/26/17   Larey Dresser, MD  OXYGEN Inhale 2.5 L into the lungs continuous.     [provider]  spironolactone (ALDACTONE) 25 MG tablet Take 1 tablet by mouth daily. 04/03/19   Larey Dresser, MD  thiamine 100 MG tablet Take 1 tablet (100 mg total) by mouth daily. 05/24/15   Bonnielee Haff, MD  torsemide (DEMADEX) 20 MG tablet Take 4 tablets (80 mg total) by mouth daily. 02/07/19   Larey Dresser, MD  traMADol Veatrice Bourbon) 50 MG tablet 1-2 tabs po tid prn pain Patient not taking: Reported on 11/28/2018 11/04/18   Tammi Sou, MD    Allergies    Warfarin  Review of Systems   Review of Systems  Respiratory: Positive for shortness of breath.   All other systems reviewed and are negative.   Physical Exam Updated Vital Signs Temp 98.3 F (36.8 C) (Oral)   Physical Exam Vitals and nursing note reviewed.  Constitutional:      General: He is not in acute distress.    Appearance: He is well-developed and normal weight.  HENT:     Head: Normocephalic and atraumatic.  Eyes:     Conjunctiva/sclera: Conjunctivae normal.     Pupils: Pupils are equal, round, and reactive to light.  Cardiovascular:     Rate and Rhythm: Normal rate and regular rhythm.      Heart sounds: No murmur.  Pulmonary:     Effort: Pulmonary effort is normal. No tachypnea or respiratory distress.     Breath sounds: Normal breath sounds. No wheezing or rales.  Abdominal:     General: There is no distension.     Palpations: Abdomen is soft.     Tenderness: There is no abdominal tenderness. There is no guarding or rebound.  Musculoskeletal:        General: No tenderness. Normal range of motion.     Cervical back: Normal range of motion and neck supple.     Right lower leg: No edema.     Left lower leg: No edema.  Skin:    General: Skin is warm and dry.     Findings: No erythema or rash.  Neurological:     General: No focal deficit present.     Mental Status: He is alert. Mental status is at baseline.     Comments: Oriented to person  Psychiatric:        Mood and Affect: Mood normal.        Behavior: Behavior normal.        Thought Content: Thought content normal.     ED Results / Procedures / Treatments   Labs (all labs ordered are listed, but only abnormal results  are displayed) Labs Reviewed  CBC WITH DIFFERENTIAL/PLATELET - Abnormal; Notable for the following components:      Result Value   RBC 4.14 (*)    Hemoglobin 11.7 (*)    HCT 38.1 (*)    Monocytes Absolute 1.4 (*)    Abs Immature Granulocytes 0.08 (*)    All other components within normal limits  COMPREHENSIVE METABOLIC PANEL - Abnormal; Notable for the following components:   Sodium 132 (*)    Potassium 5.8 (*)    Chloride 95 (*)    Glucose, Bld 394 (*)    BUN 43 (*)    Creatinine, Ser 1.59 (*)    Calcium 8.3 (*)    Albumin 3.2 (*)    AST 43 (*)    Total Bilirubin 1.4 (*)    GFR calc non Af Amer 43 (*)    GFR calc Af Amer 50 (*)    All other components within normal limits  POC SARS CORONAVIRUS 2 AG -  ED - Abnormal; Notable for the following components:   SARS Coronavirus 2 Ag POSITIVE (*)    All other components within normal limits  BRAIN NATRIURETIC PEPTIDE  AMMONIA    URINALYSIS, ROUTINE W REFLEX MICROSCOPIC  I-STAT VENOUS BLOOD GAS, ED  I-STAT CHEM 8, ED    EKG EKG Interpretation  Date/Time:  Friday May 26 2019 14:44:07 EST Ventricular Rate:  60 PR Interval:    QRS Duration: 178 QT Interval:  485 QTC Calculation: 485 R Axis:   -117 Text Interpretation: VENTRICULAR PACED RHYTHM Nonspecific IVCD with LAD No significant change since last tracing Confirmed by Blanchie Dessert ([id number]) on 05/26/2019 3:13:50 PM   Radiology DG Chest Port 1 View  Result Date: 05/26/2019 CLINICAL DATA:  Shortness of breath and cough EXAM: PORTABLE CHEST 1 VIEW COMPARISON:  December 13, 2017 FINDINGS: There is cardiomegaly with pulmonary venous hypertension. There are pacemaker leads attached to the right atrium and right ventricle. Older abandoned leads also present on the left. Patient is status post mitral valve replacement. There is interstitial edema. No consolidation or pleural effusions. No adenopathy. No bone lesions IMPRESSION: Cardiomegaly with pulmonary vascular congestion. There is a degree of interstitial edema. The overall appearance is felt to be most consistent with a degree of congestive heart failure. No consolidation. No adenopathy. Electronically Signed   By: Lowella Grip III M.D.   On: 05/26/2019 15:13    Procedures Procedures (including critical care time)  Medications Ordered in ED Medications - No data to display  ED Course  I have reviewed the triage vital signs and the nursing notes.  Pertinent labs & imaging results that were available during my care of the patient were reviewed by me and considered in my medical decision making (see chart for details).    MDM Rules/Calculators/A&P                      Patient is a 74 year old male presenting today due to family concerns for Covid.  Patient per daughter has been extremely fatigued over the last few days falling asleep very easily.  Patient had one episode of vomiting today but  daughter notes oxygen saturations were always 92% on his home oxygen.  She did give him a dose of his lactulose because he was so fatigued today but states he does not receive it every day.  Patient has had decreased appetite in the last few days but no complaint of abdominal pain.  On exam patient  is in no acute distress and has no signs of overt fluid overload.  Labs show normal CBC and CMP with unchanged renal function.  Ammonia, VBG, BNP still pending.  EKG shows a paced rhythm but that is unchanged and chest x-ray shows some mild interstitial edema.  Patient is Covid positive which could be the cause of all of his symptoms however will rule out metabolic encephalopathy, hypercarbia or CHF.  Findings discussed with his daughter and patient checked out to Dr. Darl Householder.  Final Clinical Impression(s) / ED Diagnoses Final diagnoses:  None    Rx / DC Orders ED Discharge Orders    None       Blanchie Dessert, MD 05/26/19 1539

## 2019-05-26 NOTE — ED Notes (Signed)
Pt aware of need for urine sample, urinal at bedside, instructed to call with sample.

## 2019-05-26 NOTE — Telephone Encounter (Signed)
Noted. ED noted reviewed. He is still in ED and no mention yet as to whether he will be admitted or not.

## 2019-05-26 NOTE — ED Provider Notes (Signed)
  Physical Exam  BP 116/63   Pulse (!) 57   Temp 98.3 F (36.8 C) (Oral)   Resp 20   SpO2 100%   Physical Exam  ED Course/Procedures     Procedures  MDM  Care assumed at 3 pm. Patient has contacts positive for COVID here with some confusion, SOB. Rapid COVID antigen positive.  Signed out pending ammonia level and BNP.  5:38 PM BNP 188, close to baseline. Ammonia level normal. K 5.8 initially, repeat is 5.2. Given lokelma and his Cr is baseline. He is stable on 3 L which is baseline. Stable for discharge. Has albuterol at home.    Drenda Freeze, MD 05/26/19 [phone]

## 2019-05-26 NOTE — Discharge Instructions (Signed)
Use albuterol every 4 hrs as needed.   You tested positive for COVID   Stay home as per guidelines   See your doctor  Return to ER if you have worse trouble breathing, shortness of breath, fever, confusion      Person Under Monitoring Name: [name]  Location: [address] [place] [address]   Infection Prevention Recommendations for Individuals Confirmed to have, or Being Evaluated for, 2019 Novel Coronavirus (COVID-19) Infection Who Receive Care at Home  Individuals who are confirmed to have, or are being evaluated for, COVID-19 should follow the prevention steps below until a healthcare provider or local or state health department says they can return to normal activities.  Stay home except to get medical care You should restrict activities outside your home, except for getting medical care. Do not go to work, school, or public areas, and do not use public transportation or taxis.  Call ahead before visiting your doctor Before your medical appointment, call the healthcare provider and tell them that you have, or are being evaluated for, COVID-19 infection. This will help the healthcare provider's office take steps to keep other people from getting infected. Ask your healthcare provider to call the local or state health department.  Monitor your symptoms Seek prompt medical attention if your illness is worsening (e.g., difficulty breathing). Before going to your medical appointment, call the healthcare provider and tell them that you have, or are being evaluated for, COVID-19 infection. Ask your healthcare provider to call the local or state health department.  Wear a facemask You should wear a facemask that covers your nose and mouth when you are in the same room with other people and when you visit a healthcare provider. People who live with or visit you should also wear a facemask while they are in the same room with you.  Separate yourself from  other people in your home As much as possible, you should stay in a different room from other people in your home. Also, you should use a separate bathroom, if available.  Avoid sharing household items You should not share dishes, drinking glasses, cups, eating utensils, towels, bedding, or other items with other people in your home. After using these items, you should wash them thoroughly with soap and water.  Cover your coughs and sneezes Cover your mouth and nose with a tissue when you cough or sneeze, or you can cough or sneeze into your sleeve. Throw used tissues in a lined trash can, and immediately wash your hands with soap and water for at least 20 seconds or use an alcohol-based hand rub.  Wash your [employer] your hands often and thoroughly with soap and water for at least 20 seconds. You can use an alcohol-based hand sanitizer if soap and water are not available and if your hands are not visibly dirty. Avoid touching your eyes, nose, and mouth with unwashed hands.   Prevention Steps for Caregivers and Household Members of Individuals Confirmed to have, or Being Evaluated for, COVID-19 Infection Being Cared for in the Home  If you live with, or provide care at home for, a person confirmed to have, or being evaluated for, COVID-19 infection please follow these guidelines to prevent infection:  Follow healthcare provider's instructions Make sure that you understand and can help the patient follow any healthcare provider instructions for all care.  Provide for the patient's basic needs You should help the patient with basic needs in the home and provide support  for getting groceries, prescriptions, and other personal needs.  Monitor the patient's symptoms If they are getting sicker, call his or her medical provider and tell them that the patient has, or is being evaluated for, COVID-19 infection. This will help the healthcare provider's office take steps to keep other people  from getting infected. Ask the healthcare provider to call the local or state health department.  Limit the number of people who have contact with the patient If possible, have only one caregiver for the patient. Other household members should stay in another home or place of residence. If this is not possible, they should stay in another room, or be separated from the patient as much as possible. Use a separate bathroom, if available. Restrict visitors who do not have an essential need to be in the home.  Keep older adults, very young children, and other sick people away from the patient Keep older adults, very young children, and those who have compromised immune systems or chronic health conditions away from the patient. This includes people with chronic heart, lung, or kidney conditions, diabetes, and cancer.  Ensure good ventilation Make sure that shared spaces in the home have good air flow, such as from an air conditioner or an opened window, weather permitting.  Wash your hands often Wash your hands often and thoroughly with soap and water for at least 20 seconds. You can use an alcohol based hand sanitizer if soap and water are not available and if your hands are not visibly dirty. Avoid touching your eyes, nose, and mouth with unwashed hands. Use disposable paper towels to dry your hands. If not available, use dedicated cloth towels and replace them when they become wet.  Wear a facemask and gloves Wear a disposable facemask at all times in the room and gloves when you touch or have contact with the patient's blood, body fluids, and/or secretions or excretions, such as sweat, saliva, sputum, nasal mucus, vomit, urine, or feces.  Ensure the mask fits over your nose and mouth tightly, and do not touch it during use. Throw out disposable facemasks and gloves after using them. Do not reuse. Wash your hands immediately after removing your facemask and gloves. If your personal clothing  becomes contaminated, carefully remove clothing and launder. Wash your hands after handling contaminated clothing. Place all used disposable facemasks, gloves, and other waste in a lined container before disposing them with other household waste. Remove gloves and wash your hands immediately after handling these items.  Do not share dishes, glasses, or other household items with the patient Avoid sharing household items. You should not share dishes, drinking glasses, cups, eating utensils, towels, bedding, or other items with a patient who is confirmed to have, or being evaluated for, COVID-19 infection. After the person uses these items, you should wash them thoroughly with soap and water.  Wash laundry thoroughly Immediately remove and wash clothes or bedding that have blood, body fluids, and/or secretions or excretions, such as sweat, saliva, sputum, nasal mucus, vomit, urine, or feces, on them. Wear gloves when handling laundry from the patient. Read and follow directions on labels of laundry or clothing items and detergent. In general, wash and dry with the warmest temperatures recommended on the label.  Clean all areas the individual has used often Clean all touchable surfaces, such as counters, tabletops, doorknobs, bathroom fixtures, toilets, phones, keyboards, tablets, and bedside tables, every day. Also, clean any surfaces that may have blood, body fluids, and/or secretions or excretions on  them. Wear gloves when cleaning surfaces the patient has come in contact with. Use a diluted bleach solution (e.g., dilute bleach with 1 part bleach and 10 parts water) or a household disinfectant with a label that says EPA-registered for coronaviruses. To make a bleach solution at home, add 1 tablespoon of bleach to 1 quart (4 cups) of water. For a larger supply, add  cup of bleach to 1 gallon (16 cups) of water. Read labels of cleaning products and follow recommendations provided on product labels.  Labels contain instructions for safe and effective use of the cleaning product including precautions you should take when applying the product, such as wearing gloves or eye protection and making sure you have good ventilation during use of the product. Remove gloves and wash hands immediately after cleaning.  Monitor yourself for signs and symptoms of illness Caregivers and household members are considered close contacts, should monitor their health, and will be asked to limit movement outside of the home to the extent possible. Follow the monitoring steps for close contacts listed on the symptom monitoring form.   ? If you have additional questions, contact your local health department or call the epidemiologist on call at [phone] (available 24/7). ? This guidance is subject to change. For the most up-to-date guidance from [facility]), please refer to their website: [email or web address]

## 2019-05-26 NOTE — ED Notes (Signed)
[phone] cyristal  Daughter wants an update
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
## 2019-05-26 NOTE — Telephone Encounter (Signed)
FYI  Please see below

## 2019-05-30 ENCOUNTER — Inpatient Hospital Stay (HOSPITAL_COMMUNITY)
Admission: EM | Admit: 2019-05-30 | Discharge: 2019-06-14 | DRG: 177 | Disposition: A | Discharge status: Skilled Nursing Facility | Payer: Medicare Other | Attending: Family Medicine | Admitting: Family Medicine

## 2019-05-30 ENCOUNTER — Encounter (HOSPITAL_COMMUNITY): Payer: Self-pay | Admitting: Emergency Medicine

## 2019-05-30 ENCOUNTER — Telehealth (HOSPITAL_COMMUNITY): Payer: Self-pay

## 2019-05-30 ENCOUNTER — Telehealth: Payer: Self-pay | Admitting: Internal Medicine

## 2019-05-30 ENCOUNTER — Emergency Department (HOSPITAL_COMMUNITY): Payer: Medicare Other

## 2019-05-30 ENCOUNTER — Other Ambulatory Visit: Payer: Self-pay

## 2019-05-30 ENCOUNTER — Inpatient Hospital Stay (HOSPITAL_COMMUNITY): Payer: Medicare Other

## 2019-05-30 ENCOUNTER — Other Ambulatory Visit (HOSPITAL_COMMUNITY): Payer: Self-pay

## 2019-05-30 DIAGNOSIS — N1831 Chronic kidney disease, stage 3a: Secondary | ICD-10-CM | POA: Diagnosis not present

## 2019-05-30 DIAGNOSIS — I442 Atrioventricular block, complete: Secondary | ICD-10-CM | POA: Diagnosis present

## 2019-05-30 DIAGNOSIS — R0602 Shortness of breath: Secondary | ICD-10-CM | POA: Diagnosis not present

## 2019-05-30 DIAGNOSIS — I739 Peripheral vascular disease, unspecified: Secondary | ICD-10-CM | POA: Diagnosis present

## 2019-05-30 DIAGNOSIS — U071 COVID-19: Secondary | ICD-10-CM | POA: Diagnosis not present

## 2019-05-30 DIAGNOSIS — F17210 Nicotine dependence, cigarettes, uncomplicated: Secondary | ICD-10-CM | POA: Diagnosis present

## 2019-05-30 DIAGNOSIS — Z9981 Dependence on supplemental oxygen: Secondary | ICD-10-CM

## 2019-05-30 DIAGNOSIS — J9600 Acute respiratory failure, unspecified whether with hypoxia or hypercapnia: Secondary | ICD-10-CM | POA: Diagnosis not present

## 2019-05-30 DIAGNOSIS — N183 Chronic kidney disease, stage 3 unspecified: Secondary | ICD-10-CM | POA: Diagnosis present

## 2019-05-30 DIAGNOSIS — J1282 Pneumonia due to coronavirus disease 2019: Secondary | ICD-10-CM | POA: Diagnosis present

## 2019-05-30 DIAGNOSIS — K760 Fatty (change of) liver, not elsewhere classified: Secondary | ICD-10-CM | POA: Diagnosis present

## 2019-05-30 DIAGNOSIS — E1122 Type 2 diabetes mellitus with diabetic chronic kidney disease: Secondary | ICD-10-CM | POA: Diagnosis not present

## 2019-05-30 DIAGNOSIS — Z888 Allergy status to other drugs, medicaments and biological substances status: Secondary | ICD-10-CM

## 2019-05-30 DIAGNOSIS — I513 Intracardiac thrombosis, not elsewhere classified: Secondary | ICD-10-CM | POA: Diagnosis present

## 2019-05-30 DIAGNOSIS — E7800 Pure hypercholesterolemia, unspecified: Secondary | ICD-10-CM | POA: Diagnosis present

## 2019-05-30 DIAGNOSIS — L409 Psoriasis, unspecified: Secondary | ICD-10-CM | POA: Diagnosis not present

## 2019-05-30 DIAGNOSIS — Z794 Long term (current) use of insulin: Secondary | ICD-10-CM

## 2019-05-30 DIAGNOSIS — G9341 Metabolic encephalopathy: Secondary | ICD-10-CM | POA: Diagnosis not present

## 2019-05-30 DIAGNOSIS — Z823 Family history of stroke: Secondary | ICD-10-CM

## 2019-05-30 DIAGNOSIS — E1165 Type 2 diabetes mellitus with hyperglycemia: Secondary | ICD-10-CM | POA: Diagnosis not present

## 2019-05-30 DIAGNOSIS — Z953 Presence of xenogenic heart valve: Secondary | ICD-10-CM

## 2019-05-30 DIAGNOSIS — F0390 Unspecified dementia without behavioral disturbance: Secondary | ICD-10-CM | POA: Diagnosis not present

## 2019-05-30 DIAGNOSIS — J9611 Chronic respiratory failure with hypoxia: Secondary | ICD-10-CM | POA: Diagnosis not present

## 2019-05-30 DIAGNOSIS — F05 Delirium due to known physiological condition: Secondary | ICD-10-CM | POA: Diagnosis not present

## 2019-05-30 DIAGNOSIS — I5043 Acute on chronic combined systolic (congestive) and diastolic (congestive) heart failure: Secondary | ICD-10-CM | POA: Diagnosis not present

## 2019-05-30 DIAGNOSIS — E1151 Type 2 diabetes mellitus with diabetic peripheral angiopathy without gangrene: Secondary | ICD-10-CM | POA: Diagnosis present

## 2019-05-30 DIAGNOSIS — Z8673 Personal history of transient ischemic attack (TIA), and cerebral infarction without residual deficits: Secondary | ICD-10-CM | POA: Diagnosis not present

## 2019-05-30 DIAGNOSIS — F0151 Vascular dementia with behavioral disturbance: Secondary | ICD-10-CM | POA: Diagnosis present

## 2019-05-30 DIAGNOSIS — F319 Bipolar disorder, unspecified: Secondary | ICD-10-CM | POA: Diagnosis present

## 2019-05-30 DIAGNOSIS — I482 Chronic atrial fibrillation, unspecified: Secondary | ICD-10-CM | POA: Diagnosis not present

## 2019-05-30 DIAGNOSIS — J449 Chronic obstructive pulmonary disease, unspecified: Secondary | ICD-10-CM | POA: Diagnosis present

## 2019-05-30 DIAGNOSIS — Z95810 Presence of automatic (implantable) cardiac defibrillator: Secondary | ICD-10-CM | POA: Diagnosis not present

## 2019-05-30 DIAGNOSIS — Z820 Family history of epilepsy and other diseases of the nervous system: Secondary | ICD-10-CM

## 2019-05-30 DIAGNOSIS — I255 Ischemic cardiomyopathy: Secondary | ICD-10-CM | POA: Diagnosis not present

## 2019-05-30 DIAGNOSIS — R0902 Hypoxemia: Secondary | ICD-10-CM

## 2019-05-30 DIAGNOSIS — M6281 Muscle weakness (generalized): Secondary | ICD-10-CM | POA: Diagnosis not present

## 2019-05-30 DIAGNOSIS — T380X5A Adverse effect of glucocorticoids and synthetic analogues, initial encounter: Secondary | ICD-10-CM | POA: Diagnosis not present

## 2019-05-30 DIAGNOSIS — I2510 Atherosclerotic heart disease of native coronary artery without angina pectoris: Secondary | ICD-10-CM | POA: Diagnosis not present

## 2019-05-30 DIAGNOSIS — Z8249 Family history of ischemic heart disease and other diseases of the circulatory system: Secondary | ICD-10-CM

## 2019-05-30 DIAGNOSIS — J9621 Acute and chronic respiratory failure with hypoxia: Secondary | ICD-10-CM | POA: Diagnosis present

## 2019-05-30 DIAGNOSIS — Z7401 Bed confinement status: Secondary | ICD-10-CM | POA: Diagnosis not present

## 2019-05-30 DIAGNOSIS — J80 Acute respiratory distress syndrome: Secondary | ICD-10-CM | POA: Diagnosis not present

## 2019-05-30 DIAGNOSIS — I130 Hypertensive heart and chronic kidney disease with heart failure and stage 1 through stage 4 chronic kidney disease, or unspecified chronic kidney disease: Secondary | ICD-10-CM | POA: Diagnosis not present

## 2019-05-30 DIAGNOSIS — J440 Chronic obstructive pulmonary disease with acute lower respiratory infection: Secondary | ICD-10-CM | POA: Diagnosis not present

## 2019-05-30 DIAGNOSIS — R270 Ataxia, unspecified: Secondary | ICD-10-CM | POA: Diagnosis not present

## 2019-05-30 DIAGNOSIS — Z743 Need for continuous supervision: Secondary | ICD-10-CM | POA: Diagnosis not present

## 2019-05-30 DIAGNOSIS — M2550 Pain in unspecified joint: Secondary | ICD-10-CM | POA: Diagnosis not present

## 2019-05-30 DIAGNOSIS — I639 Cerebral infarction, unspecified: Secondary | ICD-10-CM | POA: Diagnosis present

## 2019-05-30 DIAGNOSIS — J9601 Acute respiratory failure with hypoxia: Secondary | ICD-10-CM | POA: Diagnosis present

## 2019-05-30 DIAGNOSIS — Z7951 Long term (current) use of inhaled steroids: Secondary | ICD-10-CM

## 2019-05-30 DIAGNOSIS — R000 Tachycardia, unspecified: Secondary | ICD-10-CM | POA: Diagnosis not present

## 2019-05-30 DIAGNOSIS — Z7901 Long term (current) use of anticoagulants: Secondary | ICD-10-CM

## 2019-05-30 DIAGNOSIS — IMO0002 Reserved for concepts with insufficient information to code with codable children: Secondary | ICD-10-CM

## 2019-05-30 DIAGNOSIS — Z951 Presence of aortocoronary bypass graft: Secondary | ICD-10-CM

## 2019-05-30 DIAGNOSIS — Z86718 Personal history of other venous thrombosis and embolism: Secondary | ICD-10-CM

## 2019-05-30 LAB — GLUCOSE, CAPILLARY
Glucose-Capillary: 212 mg/dL — ABNORMAL HIGH (ref 70–99)
Glucose-Capillary: 371 mg/dL — ABNORMAL HIGH (ref 70–99)
Glucose-Capillary: 514 mg/dL (ref 70–99)

## 2019-05-30 LAB — CBC WITH DIFFERENTIAL/PLATELET
Abs Immature Granulocytes: 0.12 10*3/uL — ABNORMAL HIGH (ref 0.00–0.07)
Basophils Absolute: 0 10*3/uL (ref 0.0–0.1)
Basophils Relative: 0 %
Eosinophils Absolute: 0.1 10*3/uL (ref 0.0–0.5)
Eosinophils Relative: 1 %
HCT: 35.4 % — ABNORMAL LOW (ref 39.0–52.0)
Hemoglobin: 10.8 g/dL — ABNORMAL LOW (ref 13.0–17.0)
Immature Granulocytes: 1 %
Lymphocytes Relative: 14 %
Lymphs Abs: 1.2 10*3/uL (ref 0.7–4.0)
MCH: 28.1 pg (ref 26.0–34.0)
MCHC: 30.5 g/dL (ref 30.0–36.0)
MCV: 91.9 fL (ref 80.0–100.0)
Monocytes Absolute: 1.5 10*3/uL — ABNORMAL HIGH (ref 0.1–1.0)
Monocytes Relative: 17 %
Neutro Abs: 6 10*3/uL (ref 1.7–7.7)
Neutrophils Relative %: 67 %
Platelets: 183 10*3/uL (ref 150–400)
RBC: 3.85 MIL/uL — ABNORMAL LOW (ref 4.22–5.81)
RDW: 13.6 % (ref 11.5–15.5)
WBC: 9 10*3/uL (ref 4.0–10.5)
nRBC: 0 % (ref 0.0–0.2)

## 2019-05-30 LAB — COMPREHENSIVE METABOLIC PANEL
ALT: 13 U/L (ref 0–44)
AST: 21 U/L (ref 15–41)
Albumin: 3 g/dL — ABNORMAL LOW (ref 3.5–5.0)
Alkaline Phosphatase: 65 U/L (ref 38–126)
Anion gap: 14 (ref 5–15)
BUN: 36 mg/dL — ABNORMAL HIGH (ref 8–23)
CO2: 28 mmol/L (ref 22–32)
Calcium: 8.8 mg/dL — ABNORMAL LOW (ref 8.9–10.3)
Chloride: 94 mmol/L — ABNORMAL LOW (ref 98–111)
Creatinine, Ser: 1.8 mg/dL — ABNORMAL HIGH (ref 0.61–1.24)
GFR calc Af Amer: 43 mL/min — ABNORMAL LOW (ref >60)
GFR calc non Af Amer: 37 mL/min — ABNORMAL LOW (ref >60)
Glucose, Bld: 250 mg/dL — ABNORMAL HIGH (ref 70–99)
Potassium: 3.9 mmol/L (ref 3.5–5.1)
Sodium: 136 mmol/L (ref 135–145)
Total Bilirubin: 0.5 mg/dL (ref 0.3–1.2)
Total Protein: 6.9 g/dL (ref 6.5–8.1)

## 2019-05-30 LAB — BRAIN NATRIURETIC PEPTIDE: B Natriuretic Peptide: 229.2 pg/mL — ABNORMAL HIGH (ref 0.0–100.0)

## 2019-05-30 LAB — POCT I-STAT EG7
Acid-Base Excess: 7 mmol/L — ABNORMAL HIGH (ref 0.0–2.0)
Bicarbonate: 32.2 mmol/L — ABNORMAL HIGH (ref 20.0–28.0)
Calcium, Ion: 1.1 mmol/L — ABNORMAL LOW (ref 1.15–1.40)
HCT: 35 % — ABNORMAL LOW (ref 39.0–52.0)
Hemoglobin: 11.9 g/dL — ABNORMAL LOW (ref 13.0–17.0)
O2 Saturation: 99 %
Potassium: 3.8 mmol/L (ref 3.5–5.1)
Sodium: 135 mmol/L (ref 135–145)
TCO2: 34 mmol/L — ABNORMAL HIGH (ref 22–32)
pCO2, Ven: 48.6 mmHg (ref 44.0–60.0)
pH, Ven: 7.43 (ref 7.250–7.430)
pO2, Ven: 126 mmHg — ABNORMAL HIGH (ref 32.0–45.0)

## 2019-05-30 LAB — LACTIC ACID, PLASMA
Lactic Acid, Venous: 1.5 mmol/L (ref 0.5–1.9)
Lactic Acid, Venous: 0.7 mmol/L (ref 0.5–1.9)

## 2019-05-30 LAB — D-DIMER, QUANTITATIVE: D-Dimer, Quant: 0.47 ug/mL-FEU (ref 0.00–0.50)

## 2019-05-30 LAB — DIGOXIN LEVEL: Digoxin Level: 0.6 ng/mL — ABNORMAL LOW (ref 0.8–2.0)

## 2019-05-30 LAB — C-REACTIVE PROTEIN: CRP: 6.1 mg/dL — ABNORMAL HIGH (ref <1.0)

## 2019-05-30 LAB — LACTATE DEHYDROGENASE: LDH: 214 U/L — ABNORMAL HIGH (ref 98–192)

## 2019-05-30 LAB — FIBRINOGEN: Fibrinogen: 684 mg/dL — ABNORMAL HIGH (ref 210–475)

## 2019-05-30 LAB — TROPONIN I (HIGH SENSITIVITY)
Troponin I (High Sensitivity): 76 ng/L — ABNORMAL HIGH (ref <18)
Troponin I (High Sensitivity): 68 ng/L — ABNORMAL HIGH (ref <18)

## 2019-05-30 LAB — PROCALCITONIN: Procalcitonin: <0.1 ng/mL

## 2019-05-30 LAB — FERRITIN: Ferritin: 180 ng/mL (ref 24–336)

## 2019-05-30 LAB — GLUCOSE, RANDOM: Glucose, Bld: 524 mg/dL (ref 70–99)

## 2019-05-30 LAB — TRIGLYCERIDES: Triglycerides: 242 mg/dL — ABNORMAL HIGH (ref <150)

## 2019-05-30 LAB — CBG MONITORING, ED: Glucose-Capillary: 164 mg/dL — ABNORMAL HIGH (ref 70–99)

## 2019-05-30 MED ORDER — SPIRONOLACTONE 25 MG PO TABS: 25.0000 mg | ORAL_TABLET | Freq: Every day | ORAL | Status: DC

## 2019-05-30 MED ORDER — THIAMINE HCL 100 MG PO TABS
100.0000 mg | ORAL_TABLET | Freq: Every day | ORAL | Status: DC
  Administered 2019-05-30 – 2019-06-13 (×15): 100 mg via ORAL
  Filled 2019-05-30 (×15): qty 1

## 2019-05-30 MED ORDER — SODIUM CHLORIDE 0.9 % IV SOLN
100.0000 mg | Freq: Every day | INTRAVENOUS | Status: AC
  Administered 2019-05-31 – 2019-06-03 (×4): 100 mg via INTRAVENOUS
  Filled 2019-05-30 (×4): qty 20

## 2019-05-30 MED ORDER — INSULIN ASPART 100 UNIT/ML ~~LOC~~ SOLN
0.0000 [IU] | Freq: Three times a day (TID) | SUBCUTANEOUS | Status: DC
  Administered 2019-05-30: 09:00:00 2 [IU] via SUBCUTANEOUS
  Administered 2019-05-30: 3 [IU] via SUBCUTANEOUS
  Administered 2019-05-30: 9 [IU] via SUBCUTANEOUS
  Administered 2019-05-31: 5 [IU] via SUBCUTANEOUS

## 2019-05-30 MED ORDER — ACETAMINOPHEN 325 MG PO TABS: 650.0000 mg | ORAL_TABLET | Freq: Four times a day (QID) | ORAL | Status: DC | PRN

## 2019-05-30 MED ORDER — ACETAMINOPHEN 650 MG RE SUPP: 650.0000 mg | Freq: Four times a day (QID) | RECTAL | Status: DC | PRN

## 2019-05-30 MED ORDER — CARVEDILOL 3.125 MG PO TABS
6.2500 mg | ORAL_TABLET | Freq: Two times a day (BID) | ORAL | Status: DC
  Administered 2019-05-30 – 2019-06-09 (×19): 6.25 mg via ORAL
  Filled 2019-05-30 (×22): qty 2

## 2019-05-30 MED ORDER — TORSEMIDE 20 MG PO TABS: 80.0000 mg | ORAL_TABLET | Freq: Every day | ORAL | Status: DC

## 2019-05-30 MED ORDER — LACTULOSE 10 GM/15ML PO SOLN
30.0000 g | Freq: Every day | ORAL | Status: DC
  Administered 2019-05-30 – 2019-06-14 (×15): 30 g via ORAL
  Filled 2019-05-30 (×16): qty 45

## 2019-05-30 MED ORDER — SODIUM CHLORIDE 0.9 % IV SOLN
200.0000 mg | Freq: Once | INTRAVENOUS | Status: AC
  Administered 2019-05-30: 200 mg via INTRAVENOUS
  Filled 2019-05-30: qty 40

## 2019-05-30 MED ORDER — DIGOXIN 125 MCG PO TABS
0.1250 mg | ORAL_TABLET | Freq: Every day | ORAL | Status: DC
  Administered 2019-05-30 – 2019-06-14 (×16): 0.125 mg via ORAL
  Filled 2019-05-30 (×16): qty 1

## 2019-05-30 MED ORDER — ONDANSETRON HCL 4 MG/2ML IJ SOLN: 4.0000 mg | Freq: Four times a day (QID) | INTRAMUSCULAR | Status: DC | PRN

## 2019-05-30 MED ORDER — LOSARTAN POTASSIUM 50 MG PO TABS: 25.0000 mg | ORAL_TABLET | Freq: Every day | ORAL | Status: DC

## 2019-05-30 MED ORDER — INSULIN ASPART 100 UNIT/ML ~~LOC~~ SOLN
7.0000 [IU] | Freq: Once | SUBCUTANEOUS | Status: AC
  Administered 2019-05-30: 7 [IU] via SUBCUTANEOUS

## 2019-05-30 MED ORDER — FUROSEMIDE 10 MG/ML IJ SOLN
60.0000 mg | Freq: Two times a day (BID) | INTRAMUSCULAR | Status: DC
  Administered 2019-05-30 – 2019-06-05 (×13): 60 mg via INTRAVENOUS
  Filled 2019-05-30 (×13): qty 6

## 2019-05-30 MED ORDER — NITROGLYCERIN 0.4 MG SL SUBL: 0.4000 mg | SUBLINGUAL_TABLET | SUBLINGUAL | Status: DC | PRN

## 2019-05-30 MED ORDER — ATORVASTATIN CALCIUM 40 MG PO TABS
40.0000 mg | ORAL_TABLET | Freq: Every evening | ORAL | Status: DC
  Administered 2019-05-30 – 2019-06-13 (×15): 40 mg via ORAL
  Filled 2019-05-30 (×15): qty 1

## 2019-05-30 MED ORDER — INSULIN ASPART 100 UNIT/ML ~~LOC~~ SOLN: 0.0000 [IU] | Freq: Every day | SUBCUTANEOUS | Status: DC

## 2019-05-30 MED ORDER — APIXABAN 5 MG PO TABS
5.0000 mg | ORAL_TABLET | Freq: Two times a day (BID) | ORAL | Status: DC
  Administered 2019-05-30 – 2019-06-14 (×31): 5 mg via ORAL
  Filled 2019-05-30 (×32): qty 1

## 2019-05-30 MED ORDER — ARFORMOTEROL TARTRATE 15 MCG/2ML IN NEBU
15.0000 ug | INHALATION_SOLUTION | Freq: Two times a day (BID) | RESPIRATORY_TRACT | Status: DC
  Filled 2019-05-30 (×8): qty 2

## 2019-05-30 MED ORDER — ONDANSETRON HCL 4 MG PO TABS: 4.0000 mg | ORAL_TABLET | Freq: Four times a day (QID) | ORAL | Status: DC | PRN

## 2019-05-30 MED ORDER — DEXAMETHASONE SODIUM PHOSPHATE 10 MG/ML IJ SOLN
6.0000 mg | Freq: Every day | INTRAMUSCULAR | Status: DC
  Administered 2019-05-30 – 2019-06-03 (×5): 6 mg via INTRAVENOUS
  Filled 2019-05-30 (×6): qty 1

## 2019-05-30 NOTE — ED Provider Notes (Signed)
Emergency Department Provider Note   I have reviewed the triage vital signs and the nursing notes.   HISTORY  Chief Complaint Shortness of Breath   HPI [name] is a 73 y.o. male who presents with worsening dypsnea with recent dx of covid. Initially lethargic and hypoxic however both improved with nonrebreather.  still with cough. No fever. Vs WNL with ems.   No other associated or modifying symptoms.    Past Medical History:  Diagnosis Date  . AICD (automatic cardioverter/defibrillator) present   . Alcohol abuse   . Anxiety   . Bipolar disorder (HCC)   . Biventricular automatic implantable cardioverter defibrillator in situ    a. 01/2014 s/p MDT DTBA1D1 Viva XT CRT-D (ser # [id number]).  . CAD (coronary artery disease)    a. s/p prior PCI/stenting of the LCX;  b. 08/2014 MV: EF 20%, large septal, apical, and inferior infarct from apex to base, no ischemia;  c. 08/2014 NSTEMI/Cath: LM mod distal dzs extending into ostial LCX (80%), LAD tortuous, RI nl, OM mod dzs, RCA 100 CTO with R->R and L->R collats-->Med Rx.  . CHF (congestive heart failure) (HCC)   . Chronic atrial fibrillation (HCC)    a. CHA2DS2VASc = 6->eliquis;  b. S/P AVN RFCA an BiV ICD placement.  . Chronic renal insufficiency, stage 3 (moderate) 2020   GFR low 40s (estimated baseline sCr 1.5)  . Chronic respiratory failure (HCC)    hypoxic and hypercarbic  . Chronic systolic congestive heart failure (HCC)    a. 01/2014 Echo: EF 25-30%, mod conc LVH, mod dil LA.  Marland Kitchen Complete heart block (HCC)    a. In setting of prior AV nodal ablation r/t afib-->BiV ICD (01/2014).  . COPD (chronic obstructive pulmonary disease) (HCC)   . COVID-19 virus infection 05/26/2019  . CVA (cerebral vascular accident) (HCC)    a. Multiple prior embolic strokes.  . Dementia (HCC)    suspect vascular  . Depression   . Diabetes mellitus (HCC)   . DVT (deep venous thrombosis) (HCC)   . Hepatic steatosis    w/out imaging evidence of  cirrhosis on u/s or CT scan (as of 01/2019).  +hx of elevated ammonia.  . Hypertension   . Mitral valve disease    a. remote mitral replacement with Bjork Shiley valve;  b. 06/2006 Redo MVR with tissue valve.  . Mixed Ischemic and Nonischemic Cardiomyopathy    a. 8/.2015 Echo: Ef 25-30%;  b. 01/2014 s/p MDT [id number] Talbot Grumbling XT CRT-D (ser # [id number] H).  . On home oxygen therapy    "2L; 24/7" (65/16/2019)  . Psoriasis    "back of his head; elbows; finger of left hand" (06/07/2017)  . Pure hypercholesterolemia   . S/P AV nodal ablation   . Seborrheic dermatitis   . Tobacco dependence    current as of 07/2018  . Ventral hernia     Patient Active Problem List   Diagnosis Date Noted  . Acute respiratory failure due to COVID-19 (HCC) 05/30/2019  . Acute respiratory failure with hypoxia (HCC) 05/30/2019  . Uncontrolled type 2 diabetes mellitus with hyperglycemia (HCC) 03/04/2018  . Acute on chronic respiratory failure with hypercapnia (HCC)   . COPD with chronic bronchitis and emphysema (HCC)   . Acute encephalopathy   . Acute respiratory failure (HCC)   . Acute on chronic combined systolic (congestive) and diastolic (congestive) heart failure (HCC) 10/21/2017  . Chronic hypoxemic respiratory failure (HCC) 10/21/2017  . Bipolar disorder (HCC) 10/21/2017  .  Chronic atrial fibrillation (HCC) 10/21/2017  . History of CVA (cerebrovascular accident) 10/21/2017  . COPD (chronic obstructive pulmonary disease) (HCC) 10/21/2017  . Tobacco abuse 10/21/2017  . History of alcohol use 10/21/2017  . CKD (chronic kidney disease) stage 3, GFR 30-59 ml/min 10/21/2017  . Biventricular automatic implantable cardioverter defibrillator in situ 10/21/2017  . Dementia  10/21/2017  . History of mitral valve replacement 10/21/2017  . Coronary artery disease 10/21/2017  . Situational depression 10/21/2017  . Grief reaction 10/21/2017  . Chronic respiratory failure with hypoxia and hypercapnia (HCC) 06/22/2017  . CHF  exacerbation (HCC) 06/06/2017  . Acute respiratory failure with hypoxia and hypercapnia (HCC)   . COPD GOLD 0 still smoking  03/02/2016  . Chronic kidney disease (CKD), stage II (mild) 11/23/2015  . CAD (coronary artery disease)   . Dementia (HCC)   . Chronic respiratory failure with hypoxia (HCC) 10/11/2015  . Chronic systolic CHF (congestive heart failure) (HCC) 05/30/2015  . Cigarette smoker 12/24/2014  . Stroke (HCC)   . Mitral valve disease   . Depression   . Mixed Ischemic and Nonischemic Cardiomyopathy   . Alcohol abuse   . Chronic anticoagulation 08/22/2014  . PVD (peripheral vascular disease) (HCC) 02/28/2014  . Mural thrombus of heart 02/07/2014  . Hypertension 06/14/2012  . Automatic implantable cardioverter-defibrillator in situ 04/04/2012  . Hyperlipidemia 10/27/2011  . Ventricular tachycardia (HCC) 07/14/2011  . Atrial fibrillation, chronic (HCC) 07/13/2011  . Right atrial thrombus (HCC) 07/11/2011    Past Surgical History:  Procedure Laterality Date  . AV NODE ABLATION  2007  . CARDIAC CATHETERIZATION  06/2006   Mild ostial L main stenosis, CFX stent patent, RCA occluded (old)  . CORONARY ARTERY BYPASS GRAFT  2008  . CYSTOSCOPY W/ URETERAL STENT PLACEMENT  07/12/2011   Procedure: CYSTOSCOPY WITH RETROGRADE PYELOGRAM/URETERAL STENT PLACEMENT;  Surgeon: Sebastian Ache, MD;  Location: [facility] OR;  Service: Urology;  Laterality: Left;  . ECHO DOPPLER COMPLETE(TRANSTHOR) ([facility] HX)  04/07/2017   LV EF: 25%-30%  . EEG  2019 and 12/22/18   2019 abnl->no seizures, not on seizure meds (as of 01/2019).  Rpt EEG 12/2018 no epileptiform activity.  . EXPLORATORY LAPAROTOMY    . HERNIA REPAIR    . ICD GENERATOR CHANGE  02/01/2014   Gen change to: Medtronic VIVA pulse generator, serial number [id number]  . INSERT / REPLACE / REMOVE PACEMAKER    . LEFT HEART CATHETERIZATION WITH CORONARY ANGIOGRAM N/A 08/23/2014   Procedure: LEFT HEART CATHETERIZATION WITH CORONARY ANGIOGRAM;  Surgeon:  Corky Crafts, MD;  Location: [facility] CATH LAB;  Service: Cardiovascular;  Laterality: N/A;  . MITRAL VALVE REPLACEMENT     remote [facility] valve 6394 with redo tissue valve 06/2006  . PACEMAKER GENERATOR CHANGE N/A 02/01/2014   Procedure: PACEMAKER GENERATOR CHANGE;  Surgeon: Duke Salvia, MD;  Location: [facility] CATH LAB;  Service: Cardiovascular;  Laterality: N/A;  . PACEMAKER PLACEMENT  2006   Changed to CRT-D in 2009  . RIGHT/LEFT HEART CATH AND CORONARY ANGIOGRAPHY N/A 06/09/2017   Procedure: RIGHT/LEFT HEART CATH AND CORONARY ANGIOGRAPHY;  Surgeon: [name], MD;  Location: [facility] INVASIVE CV LAB;  Service: Cardiovascular;  Laterality: N/A;  . TRANSTHORACIC ECHOCARDIOGRAM  10/2017   EF 30-35%, inf-lat and inf akin, MV bioprosth ok, mod dec RV fxn, pulm HTN (peak 65).  . VASECTOMY      Current Outpatient Rx  . Order #: [id number] Class: Normal  . Order #: [id number] Class: Historical Med  . Order #:  [id number] Class: Normal  . Order #: [id number] Class: Print  . Order #: [id number] Class: Normal  . Order #: [id number] Class: Normal  . Order #: [id number] Class: Normal  . Order #: [id number] Class: Normal  . Order #: [id number] Class: Normal  . Order #: [id number] Class: Normal  . Order #: [id number] Class: Normal  . Order #: [id number] Class: Normal  . Order #: [id number] Class: Normal  . Order #: [id number] Class: No Print  . Order #: [id number] Class: Normal  . Order #: [id number] Class: Normal  . Order #: [id number] Class: Print  . Order #: [id number] Class: Normal  . Order #: [id number] Class: Historical Med  . Order #: [id number] Class: Normal  . Order #: [id number] Class: Print  . Order #: [id number] Class: Normal    Allergies Warfarin  Family History  Problem Relation Age of Onset  . Alzheimer's disease Mother   . CAD Mother   . Heart attack Father   . Heart disease Father   . CAD Father   . Heart attack Son   . Varicose Veins Son   . Deep vein thrombosis Son   . Heart disease Son   . Stroke  Other   . Heart disease Other   . Heart disease Brother   . CAD Brother     Social History Social History   Tobacco Use  . Smoking status: Current Some Day Smoker    Packs/day: 2.00    Years: 45.00    Pack years: 90.00    Types: Cigarettes  . Smokeless tobacco: Never Used  . Tobacco comment: daughter states he cannot get to cigarettes now few cigarettes a day  Substance Use Topics  . Alcohol use: Not Currently    Alcohol/week: 21.0 standard drinks    Types: 21 Cans of beer per week    Comment: 10/21/2017 "~ 2-3 beers/day"  . Drug use: No    Review of Systems  All other systems negative except as documented in the HPI. All pertinent positives and negatives as reviewed in the HPI. ____________________________________________   PHYSICAL EXAM:  VITAL SIGNS: ED Triage Vitals  Enc Vitals Group     BP 05/30/19 0159 90/64     Pulse Rate 05/30/19 0159 62     Resp 05/30/19 0159 (!) 23     Temp 05/30/19 0159 98.5 F (36.9 C)     Temp Source 05/30/19 0159 Oral     SpO2 05/30/19 0159 99 %    Constitutional: Alert and oriented. Well appearing and in no acute distress. Eyes: Conjunctivae are normal. PERRL. EOMI. Head: Atraumatic. Nose: No congestion/rhinnorhea. Mouth/Throat: Mucous membranes are moist.  Oropharynx non-erythematous. Neck: No stridor.  No meningeal signs.   Cardiovascular: Normal rate, regular rhythm. Good peripheral circulation. Grossly normal heart sounds.   Respiratory: tachypneic respiratory effort.  No retractions. Lungs diminished. Gastrointestinal: Soft and nontender. No distention.  Musculoskeletal: No lower extremity tenderness nor edema. No gross deformities of extremities. Neurologic:  Normal speech and language. No gross focal neurologic deficits are appreciated.  Skin:  Skin is warm, dry and intact. No rash noted.  ____________________________________________   LABS (all labs ordered are listed, but only abnormal results are displayed)  Labs  Reviewed  CBC WITH DIFFERENTIAL/PLATELET - Abnormal; Notable for the following components:      Result Value   RBC 3.85 (*)    Hemoglobin 10.8 (*)    HCT 35.4 (*)    Monocytes Absolute 1.5 (*)    Abs Immature Granulocytes 0.12 (*)    All other components within normal limits  COMPREHENSIVE METABOLIC PANEL -  Abnormal; Notable for the following components:   Chloride 94 (*)    Glucose, Bld 250 (*)    BUN 36 (*)    Creatinine, Ser 1.80 (*)    Calcium 8.8 (*)    Albumin 3.0 (*)    GFR calc non Af Amer 37 (*)    GFR calc Af Amer 43 (*)    All other components within normal limits  BRAIN NATRIURETIC PEPTIDE - Abnormal; Notable for the following components:   B Natriuretic Peptide 229.2 (*)    All other components within normal limits  LACTATE DEHYDROGENASE - Abnormal; Notable for the following components:   LDH 214 (*)    All other components within normal limits  TRIGLYCERIDES - Abnormal; Notable for the following components:   Triglycerides 242 (*)    All other components within normal limits  FIBRINOGEN - Abnormal; Notable for the following components:   Fibrinogen 684 (*)    All other components within normal limits  C-REACTIVE PROTEIN - Abnormal; Notable for the following components:   CRP 6.1 (*)    All other components within normal limits  POCT I-STAT EG7 - Abnormal; Notable for the following components:   pO2, Ven 126.0 (*)    Bicarbonate 32.2 (*)    TCO2 34 (*)    Acid-Base Excess 7.0 (*)    Calcium, Ion 1.10 (*)    HCT 35.0 (*)    Hemoglobin 11.9 (*)    All other components within normal limits  TROPONIN I (HIGH SENSITIVITY) - Abnormal; Notable for the following components:   Troponin I (High Sensitivity) 76 (*)    All other components within normal limits  TROPONIN I (HIGH SENSITIVITY) - Abnormal; Notable for the following components:   Troponin I (High Sensitivity) 68 (*)    All other components within normal limits  CULTURE, BLOOD (ROUTINE X 2)  CULTURE,  BLOOD (ROUTINE X 2)  LACTIC ACID, PLASMA  LACTIC ACID, PLASMA  D-DIMER, QUANTITATIVE (NOT AT [facility])  FERRITIN  PROCALCITONIN  I-STAT VENOUS BLOOD GAS, ED   ____________________________________________  EKG   EKG Interpretation  Date/Time:  Tuesday May 30 2019 01:49:47 EST Ventricular Rate:  62 PR Interval:    QRS Duration: 174 QT Interval:  460 QTC Calculation: 468 R Axis:   -115 Text Interpretation: VENTRICULAR PACED RHYTHM Ventricular premature complex When compared with ECG of 05/26/2019, Premature ventricular complexes are now present Confirmed by Delora Fuel ([id number]) on 05/30/2019 4:05:29 AM     ____________________________________________  RADIOLOGY  DG Chest Portable 1 View  Result Date: 05/30/2019 CLINICAL DATA:  Shortness of breath EXAM: PORTABLE CHEST 1 VIEW COMPARISON:  May 26, 2019 FINDINGS: There is mild cardiomegaly. A left-sided AICD is seen with the lead tips in the right atrium and right ventricle. Prosthetic cardiac valve is seen. Overlying median sternotomy wires. There is interval slight worsening in the hazy/interstitial airspace opacity at both lung bases. There is central pulmonary vascular congestion. Pleural base calcification at the left lung base. Elevation of the right hemidiaphragm. No acute osseous abnormality. IMPRESSION: Cardiomegaly Slight interval worsening in the interstitial/hazy airspace opacities at both lung bases which could be due to worsening in interstitial edema and/or early infectious etiology. Electronically Signed   By: Prudencio Pair M.D.   On: 05/30/2019 02:20    ____________________________________________   PROCEDURES  Procedure(s) performed:   .Critical Care Performed by: Merrily Pew, MD Authorized by: Merrily Pew, MD   Critical care provider statement:    Critical care time (minutes):  45   Critical care was necessary to treat or prevent imminent or life-threatening deterioration of the following  conditions:  Respiratory failure   Critical care was time spent personally by me on the following activities:  Discussions with consultants, evaluation of patient's response to treatment, examination of patient, ordering and performing treatments and interventions, ordering and review of laboratory studies, ordering and review of radiographic studies, pulse oximetry, re-evaluation of patient's condition, obtaining history from patient or surrogate and review of old charts     ____________________________________________   INITIAL IMPRESSION / ASSESSMENT AND PLAN / ED COURSE  Hypoxic respiratory failure 2/2 covid. Plan for admission.  Also with soft pressure and AKI, will fluid hydrate.   Pertinent labs & imaging results that were available during my care of the patient were reviewed by me and considered in my medical decision making (see chart for details).  ____________________________________________  FINAL CLINICAL IMPRESSION(S) / ED DIAGNOSES  Final diagnoses:  Hypoxia  COVID-19   MEDICATIONS GIVEN DURING THIS VISIT:  Medications  atorvastatin (LIPITOR) tablet 40 mg (has no administration in time range)  carvedilol (COREG) tablet 6.25 mg (has no administration in time range)  digoxin (LANOXIN) tablet 0.125 mg (has no administration in time range)  losartan (COZAAR) tablet 25 mg (has no administration in time range)  nitroGLYCERIN (NITROSTAT) SL tablet 0.4 mg (has no administration in time range)  spironolactone (ALDACTONE) tablet 25 mg (has no administration in time range)  torsemide (DEMADEX) tablet 80 mg (has no administration in time range)  lactulose (CHRONULAC) 10 GM/15ML solution 30 g (has no administration in time range)  apixaban (ELIQUIS) tablet 5 mg (has no administration in time range)  thiamine tablet 100 mg (has no administration in time range)  arformoterol (BROVANA) nebulizer solution 15 mcg (has no administration in time range)  acetaminophen (TYLENOL) tablet  650 mg (has no administration in time range)    Or  acetaminophen (TYLENOL) suppository 650 mg (has no administration in time range)  ondansetron (ZOFRAN) tablet 4 mg (has no administration in time range)    Or  ondansetron (ZOFRAN) injection 4 mg (has no administration in time range)  insulin aspart (novoLOG) injection 0-9 Units (has no administration in time range)  remdesivir 200 mg in sodium chloride 0.9% 250 mL IVPB (200 mg Intravenous New Bag/Given 05/30/19 [id number])    Followed by  remdesivir 100 mg in sodium chloride 0.9 % 100 mL IVPB (has no administration in time range)  dexamethasone (DECADRON) injection 6 mg (6 mg Intravenous Given 05/30/19 0624)     NEW OUTPATIENT MEDICATIONS STARTED DURING THIS VISIT:  New Prescriptions   No medications on file    Note:  This note was prepared with assistance of Dragon voice recognition software. Occasional wrong-word or sound-a-like substitutions may have occurred due to the inherent limitations of voice recognition software.   Marily MemosMesner, Citlali Gautney, MD 05/30/19 [phone]

## 2019-05-30 NOTE — Plan of Care (Signed)
  Problem: Education: Goal: Ability to describe self-care measures that may prevent or decrease complications (Diabetes Survival Skills Education) will improve Outcome: Progressing Goal: Individualized Educational Video(s) Outcome: Progressing   Problem: Coping: Goal: Ability to adjust to condition or change in health will improve Outcome: Progressing   Problem: Fluid Volume: Goal: Ability to maintain a balanced intake and output will improve Outcome: Progressing   Problem: Education: Goal: Knowledge of cardiac device and self-care will improve Outcome: Progressing Goal: Ability to safely manage health related needs after discharge will improve Outcome: Progressing Goal: Individualized Educational Video(s) Outcome: Progressing   Problem: Cardiac: Goal: Ability to achieve and maintain adequate cardiopulmonary perfusion will improve Outcome: Progressing   Problem: Education: Goal: Ability to describe self-care measures that may prevent or decrease complications (Diabetes Survival Skills Education) will improve Outcome: Progressing Goal: Individualized Educational Video(s) Outcome: Progressing   Problem: Coping: Goal: Ability to adjust to condition or change in health will improve Outcome: Progressing   Problem: Fluid Volume: Goal: Ability to maintain a balanced intake and output will improve Outcome: Progressing   Problem: Health Behavior/Discharge Planning: Goal: Ability to identify and utilize available resources and services will improve Outcome: Progressing Goal: Ability to manage health-related needs will improve Outcome: Progressing   Problem: Metabolic: Goal: Ability to maintain appropriate glucose levels will improve Outcome: Progressing   Problem: Nutritional: Goal: Maintenance of adequate nutrition will improve Outcome: Progressing Goal: Progress toward achieving an optimal weight will improve Outcome: Progressing   Problem: Skin Integrity: Goal: Risk  for impaired skin integrity will decrease Outcome: Progressing   Problem: Tissue Perfusion: Goal: Adequacy of tissue perfusion will improve Outcome: Progressing   Problem: Education: Goal: Knowledge of risk factors and measures for prevention of condition will improve Outcome: Progressing   Problem: Coping: Goal: Psychosocial and spiritual needs will be supported Outcome: Progressing   Problem: Respiratory: Goal: Will maintain a patent airway Outcome: Progressing Goal: Complications related to the disease process, condition or treatment will be avoided or minimized Outcome: Progressing   Problem: Cardiac: Goal: Ability to achieve and maintain adequate cardiopulmonary perfusion will improve Outcome: Progressing   Problem: Education: Goal: Ability to demonstrate management of disease process will improve Outcome: Not Progressing Goal: Ability to verbalize understanding of medication therapies will improve Outcome: Not Progressing Goal: Individualized Educational Video(s) Outcome: Not Progressing   Problem: Activity: Goal: Capacity to carry out activities will improve Outcome: Not Progressing

## 2019-05-30 NOTE — Progress Notes (Signed)
error 

## 2019-05-30 NOTE — Telephone Encounter (Signed)
Spoke with daughter Donella Stade (dpr on file), states that pt was lethargic, refusing to eat, and confused yesterday.  Also please note that pt tested positive for Covid-19 on 12/18.  States she checked pt's pulse ox and his sats were in the upper 60's%.  [name] called EMS, who brought pt to hospital.  [name] is upset that pulmonary has not yet rounded on patient (per chart pt arrived to ED at 1:46 this morning).  I advised [name] that pt has been rounded on by 2 different doctors since his arrival according to documentation.  I advised [name] that his hospital team is doing everything they can to get him feeling better and that he is in good hands.  I advised [name] that if she has any further questions about the status of her father to call the nurses on his floor and they could keep her updated better than we can from the office.  [name] expressed understanding.  Asked that I forward this message to MW as Juluis Rainier to pt's current status.  Forwarding to MW as Juluis Rainier.

## 2019-05-30 NOTE — ED Triage Notes (Signed)
Pt brought to ED by [facility] from home for SOB and AMS. Pt recently diagnosed with COVID 19.  On EMS arrival to his house pt was very lethargic with SPO2 88% on home 2L O2, placed on NRM, pt more alert and SPO2 increase to 95%. Paced rhythm, BP 104/64, HR 65. Pt is AO x 4, NAD noticed during triage.

## 2019-05-30 NOTE — H&P (Addendum)
History and Physical    [name] [name] DOB: [birth year] DOA: 05/30/2019  PCP: Tammi Sou, MD  Patient coming from: Home.  Chief Complaint: Shortness of breath.  History obtained from ER physician.  Patient appears confused.  Unable to reach patient's daughter or son with the number provided.  HPI: [name] is a 73 y.o. male with history of CAD chronic systolic heart failure status post ICD and pacemaker placement, A. fib, bioprosthetic mitral valve replacement, chronic kidney disease stage III, dementia, diabetes mellitus, A. fib was brought to the ER after patient became short of breath.  Patient had come to the ER 2 days ago with shortness of breath at that time patient was diagnosed with COVID-19 pneumonia and was discharged home.  Following which patient has become more acutely short of breath increasingly confused hypoxic and EMS was called.  ED Course: In the ER patient is requiring nonrebreather to maintain sats.  Chest x-ray shows worsening infiltrates.  Patient blood pressures in the low normal.  Temperature was 98.5 labs show high-sensitivity troponin of 76 BNP 229 LDH 214 creatinine 1.8 which is increased from 1.43 days ago.  Hemoglobin is 10.4 platelets 183 procalcitonin is less than 0.1 given the symptoms patient is started on IV Decadron and remdesivir admitted for further management of respiratory failure likely from COVID-19 infection.  On my exam patient is oriented to his name follows commands moves all extremities.  Review of Systems: As per HPI, rest all negative.   Past Medical History:  Diagnosis Date  . AICD (automatic cardioverter/defibrillator) present   . Alcohol abuse   . Anxiety   . Bipolar disorder ([place])   . Biventricular automatic implantable cardioverter defibrillator in situ    a. 01/2014 s/p MDT DTBA1D1 Viva XT CRT-D (ser # [id number]).  . CAD (coronary artery disease)    a. s/p prior PCI/stenting of the LCX;  b. 08/2014 MV: EF  20%, large septal, apical, and inferior infarct from apex to base, no ischemia;  c. 08/2014 NSTEMI/Cath: LM mod distal dzs extending into ostial LCX (80%), LAD tortuous, RI nl, OM mod dzs, RCA 100 CTO with R->R and L->R collats-->Med Rx.  . CHF (congestive heart failure) ([place])   . Chronic atrial fibrillation (HCC)    a. CHA2DS2VASc = 6->eliquis;  b. S/P AVN RFCA an BiV ICD placement.  . Chronic renal insufficiency, stage 3 (moderate) 2020   GFR low 40s (estimated baseline sCr 1.5)  . Chronic respiratory failure (HCC)    hypoxic and hypercarbic  . Chronic systolic congestive heart failure ([place])    a. 01/2014 Echo: EF 25-30%, mod conc LVH, mod dil LA.  Marland Kitchen Complete heart block ([place])    a. In setting of prior AV nodal ablation r/t afib-->BiV ICD (01/2014).  . COPD (chronic obstructive pulmonary disease) ([place])   . COVID-19 virus infection 05/26/2019  . CVA (cerebral vascular accident) ([name])    a. Multiple prior embolic strokes.  . Dementia ([place])    suspect vascular  . Depression   . Diabetes mellitus ([place])   . DVT (deep venous thrombosis) ([place])   . Hepatic steatosis    w/out imaging evidence of cirrhosis on u/s or CT scan (as of 01/2019).  +hx of elevated ammonia.  . Hypertension   . Mitral valve disease    a. remote mitral replacement with Bjork Shiley valve;  b. 06/2006 Redo MVR with tissue valve.  . Mixed Ischemic and Nonischemic Cardiomyopathy    a. 8/.2015  Echo: Ef 25-30%;  b. 01/2014 s/p MDT FSFS2L9 Pershing Proud XT CRT-D (ser # [id number]).  . On home oxygen therapy    "2L; 24/7" (65/16/2019)  . Psoriasis    "back of his head; elbows; finger of left hand" (06/07/2017)  . Pure hypercholesterolemia   . S/P AV nodal ablation   . Seborrheic dermatitis   . Tobacco dependence    current as of 07/2018  . Ventral hernia     Past Surgical History:  Procedure Laterality Date  . AV NODE ABLATION  2007  . CARDIAC CATHETERIZATION  06/2006   Mild ostial L main stenosis, CFX stent patent, RCA occluded  (old)  . CORONARY ARTERY BYPASS GRAFT  2008  . CYSTOSCOPY W/ URETERAL STENT PLACEMENT  07/12/2011   Procedure: CYSTOSCOPY WITH RETROGRADE PYELOGRAM/URETERAL STENT PLACEMENT;  Surgeon: Alexis Frock, MD;  Location: [facility];  Service: Urology;  Laterality: Left;  . ECHO DOPPLER COMPLETE(TRANSTHOR) ([facility] HX)  04/07/2017   LV EF: 25%-30%  . EEG  2019 and 12/22/18   2019 abnl->no seizures, not on seizure meds (as of 01/2019).  Rpt EEG 12/2018 no epileptiform activity.  . EXPLORATORY LAPAROTOMY    . HERNIA REPAIR    . ICD GENERATOR CHANGE  02/01/2014   Gen change to: Medtronic VIVA pulse generator, serial number [id number]  . INSERT / REPLACE / REMOVE PACEMAKER    . LEFT HEART CATHETERIZATION WITH CORONARY ANGIOGRAM N/A 08/23/2014   Procedure: LEFT HEART CATHETERIZATION WITH CORONARY ANGIOGRAM;  Surgeon: Jettie Booze, MD;  Location: [facility] CATH LAB;  Service: Cardiovascular;  Laterality: N/A;  . MITRAL VALVE REPLACEMENT     remote [facility] valve 8372 with redo tissue valve 06/2006  . PACEMAKER GENERATOR CHANGE N/A 02/01/2014   Procedure: PACEMAKER GENERATOR CHANGE;  Surgeon: Deboraha Sprang, MD;  Location: [facility] LAB;  Service: Cardiovascular;  Laterality: N/A;  . PACEMAKER PLACEMENT  2006   Changed to CRT-D in 2009  . RIGHT/LEFT HEART CATH AND CORONARY ANGIOGRAPHY N/A 06/09/2017   Procedure: RIGHT/LEFT HEART CATH AND CORONARY ANGIOGRAPHY;  Surgeon: Larey Dresser, MD;  Location: [place] CV LAB;  Service: Cardiovascular;  Laterality: N/A;  . TRANSTHORACIC ECHOCARDIOGRAM  10/2017   EF 30-35%, inf-lat and inf akin, MV bioprosth ok, mod dec RV fxn, pulm HTN (peak 65).  . VASECTOMY       reports that he has been smoking cigarettes. He has a 90.00 pack-year smoking history. He has never used smokeless tobacco. He reports previous alcohol use of about 21.0 standard drinks of alcohol per week. He reports that he does not use drugs.  Allergies  Allergen Reactions  . Warfarin Other (See Comments)      Non compliance and ETOH abuse    Family History  Problem Relation Age of Onset  . Alzheimer's disease Mother   . CAD Mother   . Heart attack Father   . Heart disease Father   . CAD Father   . Heart attack Son   . Varicose Veins Son   . Deep vein thrombosis Son   . Heart disease Son   . Stroke Other   . Heart disease Other   . Heart disease Brother   . CAD Brother     Prior to Admission medications   Medication Sig Start Date End Date Taking? Authorizing Provider  ACCU-CHEK GUIDE test strip TEST TWICE A DAY 09/01/18   McGowen, Adrian Blackwater, MD  arformoterol (BROVANA) 15 MCG/2ML NEBU Take 15 mcg by nebulization 2 (two)  times daily.    [provider]  atorvastatin (LIPITOR) 40 MG tablet Take 1 tablet (40 mg total) by mouth daily at 6 PM. Patient taking differently: Take 40 mg by mouth every evening.  11/09/18   Larey Dresser, MD  blood glucose meter kit and supplies Use to check blood sugar twice daily 07/19/18   McGowen, Adrian Blackwater, MD  budesonide (PULMICORT) 0.5 MG/2ML nebulizer solution Take 2 mLs (0.5 mg total) by nebulization 2 (two) times daily. DX: J44.9 06/13/18   Tanda Rockers, MD  carvedilol (COREG) 6.25 MG tablet Take 1 tablet (6.25 mg total) by mouth 2 (two) times daily with a meal. Patient taking differently: Take 6.25 mg by mouth 2 (two) times daily with a meal.  11/09/18   Larey Dresser, MD  digoxin (LANOXIN) 0.125 MG tablet Take 1 tablet (0.125 mg total) by mouth daily. 11/09/18   Larey Dresser, MD  ELIQUIS 5 MG TABS tablet Take 1 tablet by mouth twice daily. 04/03/19   Larey Dresser, MD  fluticasone (CUTIVATE) 0.05 % cream Apply topically 2 (two) times daily. Dispense largest tube available please. 11/04/18   McGowen, Adrian Blackwater, MD  glipiZIDE (GLUCOTROL XL) 2.5 MG 24 hr tablet Take 2 tab every morning and 1 tab every evening Patient taking differently: Take 2.5-5 mg by mouth See admin instructions. Take 51m every morning and  2.571mevery evening 03/30/19    McGowen, PhAdrian BlackwaterMD  Insulin Pen Needle 31G X 6 MM MISC Use to inject insulin at bedtime 08/17/18   McGowen, PhAdrian BlackwaterMD  JARDIANCE 10 MG TABS tablet Take 10 mg by mouth daily. Patient taking differently: Take 10 mg by mouth daily.  03/06/19   McLarey DresserMD  ketoconazole (NIZORAL) 2 % cream Apply to affected areas of skin once daily for 2 weeks 03/29/19   McGowen, PhAdrian BlackwaterMD  lactulose (CHRONULAC) 10 GM/15ML solution TAKE 30 MILLILITERS BY MOUTH TWICE A DAY Patient taking differently: Take 30 g by mouth daily.  04/12/18   McLarey DresserMD  loratadine (CLARITIN) 10 MG tablet Take 1 tablet (10 mg total) by mouth daily. Patient taking differently: Take 10 mg by mouth at bedtime.  03/30/19   McGowen, PhAdrian BlackwaterMD  losartan (COZAAR) 25 MG tablet Take 1 tablet (25 mg total) by mouth daily. Patient taking differently: Take 25 mg by mouth daily.  02/07/19   McLarey DresserMD  Multiple Vitamin (MULTIVITAMIN WITH MINERALS) TABS tablet Take 1 tablet by mouth daily. Patient taking differently: Take 1 tablet by mouth at bedtime.  05/24/15   KrBonnielee HaffMD  nitroGLYCERIN (NITROSTAT) 0.4 MG SL tablet Place 1 tablet (0.4 mg total) under the tongue every 5 (five) minutes as needed for chest pain. 11/26/17   McLarey DresserMD  OXYGEN Inhale 2.5 L into the lungs continuous.     [provider]  spironolactone (ALDACTONE) 25 MG tablet Take 1 tablet by mouth daily. Patient taking differently: Take 25 mg by mouth at bedtime.  04/03/19   McLarey DresserMD  thiamine 100 MG tablet Take 1 tablet (100 mg total) by mouth daily. Patient taking differently: Take 100 mg by mouth at bedtime.  05/24/15   KrBonnielee HaffMD  torsemide (DEMADEX) 20 MG tablet Take 4 tablets (80 mg total) by mouth daily. 02/07/19   McLarey DresserMD    Physical Exam: Constitutional: Moderately built and nourished. Vitals:   05/30/19 0159 05/30/19 0300  05/30/19 0445 05/30/19 0500  BP: 90/64 (!) 117/59 (!)  99/53 103/69  Pulse: 62 (!) 59    Resp: (!) 23 20 (!) 23 19  Temp: 98.5 F (36.9 C)     TempSrc: Oral     SpO2: 99% 100%     Eyes: Anicteric no pallor. ENMT: No discharge from the ears eyes nose or mouth. Neck: No masses.  JVD elevated. Respiratory: No rhonchi or crepitations. Cardiovascular: S1-S2 heard. Abdomen: Soft nontender bowel sounds present. Musculoskeletal: No edema. Skin: No rash. Neurologic: Alert awake oriented to his name.  Follows commands moves all extremities. Psychiatric: Oriented to his name.   Labs on Admission: I have personally reviewed following labs and imaging studies  CBC: Recent Labs  Lab 05/26/19 1430 05/26/19 1617 05/26/19 1625 05/30/19 0147 05/30/19 0240  WBC 10.0  --   --  9.0  --   NEUTROABS 7.1  --   --  6.0  --   HGB 11.7* 13.3 12.9* 10.8* 11.9*  HCT 38.1* 39.0 38.0* 35.4* 35.0*  MCV 92.0  --   --  91.9  --   PLT 171  --   --  183  --    Basic Metabolic Panel: Recent Labs  Lab 05/26/19 1430 05/26/19 1617 05/26/19 1625 05/30/19 0147 05/30/19 0240  NA 132* 135 134* 136 135  K 5.8* 5.2* 5.3* 3.9 3.8  CL 95* 96*  --  94*  --   CO2 24  --   --  28  --   GLUCOSE 394* 363*  --  250*  --   BUN 43* 59*  --  36*  --   CREATININE 1.59* 1.40*  --  1.80*  --   CALCIUM 8.3*  --   --  8.8*  --    GFR: CrCl cannot be calculated (Unknown ideal weight.). Liver Function Tests: Recent Labs  Lab 05/26/19 1430 05/30/19 0147  AST 43* 21  ALT 14 13  ALKPHOS 67 65  BILITOT 1.4* 0.5  PROT 7.1 6.9  ALBUMIN 3.2* 3.0*   No results for input(s): LIPASE, AMYLASE in the last 168 hours. Recent Labs  Lab 05/26/19 1527  AMMONIA 33   Coagulation Profile: No results for input(s): INR, PROTIME in the last 168 hours. Cardiac Enzymes: No results for input(s): CKTOTAL, CKMB, CKMBINDEX, TROPONINI in the last 168 hours. BNP (last 3 results) No results for input(s): PROBNP in the last 8760 hours. HbA1C: No results for input(s): HGBA1C in the last  72 hours. CBG: No results for input(s): GLUCAP in the last 168 hours. Lipid Profile: Recent Labs    05/30/19 0147  TRIG 242*   Thyroid Function Tests: No results for input(s): TSH, T4TOTAL, FREET4, T3FREE, THYROIDAB in the last 72 hours. Anemia Panel: Recent Labs    05/30/19 0147  FERRITIN 180   Urine analysis:    Component Value Date/Time   COLORURINE STRAW (A) 05/26/2019 1639   APPEARANCEUR CLEAR 05/26/2019 1639   LABSPEC 1.011 05/26/2019 1639   PHURINE 6.0 05/26/2019 1639   GLUCOSEU >=500 (A) 05/26/2019 1639   HGBUR SMALL (A) 05/26/2019 1639   BILIRUBINUR NEGATIVE 05/26/2019 1639   KETONESUR NEGATIVE 05/26/2019 1639   PROTEINUR NEGATIVE 05/26/2019 1639   UROBILINOGEN 0.2 12/24/2014 0311   NITRITE NEGATIVE 05/26/2019 1639   LEUKOCYTESUR NEGATIVE 05/26/2019 1639   Sepsis Labs: '@LABRCNTIP' (procalcitonin:4,lacticidven:4) )No results found for this or any previous visit (from the past 240 hour(s)).   Radiological Exams on Admission: DG Chest Portable 1 View  Result Date: 05/30/2019 CLINICAL DATA:  Shortness of breath EXAM: PORTABLE CHEST 1 VIEW COMPARISON:  May 26, 2019 FINDINGS: There is mild cardiomegaly. A left-sided AICD is seen with the lead tips in the right atrium and right ventricle. Prosthetic cardiac valve is seen. Overlying median sternotomy wires. There is interval slight worsening in the hazy/interstitial airspace opacity at both lung bases. There is central pulmonary vascular congestion. Pleural base calcification at the left lung base. Elevation of the right hemidiaphragm. No acute osseous abnormality. IMPRESSION: Cardiomegaly Slight interval worsening in the interstitial/hazy airspace opacities at both lung bases which could be due to worsening in interstitial edema and/or early infectious etiology. Electronically Signed   By: Prudencio Pair M.D.   On: 05/30/2019 02:20    EKG: Independently reviewed.  Paced rhythm.  Assessment/Plan Principal Problem:    Acute respiratory failure due to COVID-19 [facility] Active Problems:   Right atrial thrombus (HCC)   PVD (peripheral vascular disease) (HCC)   Mixed Ischemic and Nonischemic Cardiomyopathy   Stroke (HCC)   Dementia (HCC)   CAD (coronary artery disease)   Chronic atrial fibrillation (HCC)   CKD (chronic kidney disease) stage 3, GFR 30-59 ml/min   COPD with chronic bronchitis and emphysema (HCC)   Acute respiratory failure with hypoxia ([place])    1. Acute respiratory failure with hypoxia secondary to COVID-19 infection for which I have started patient on IV remdesivir and IV Decadron.  Closely follow respiratory status and inflammatory markers. 2. Acute encephalopathy likely from hypoxia from pneumonia.  Will check CT head. 3. Acute on chronic kidney disease stage III for now I am holding off patient's ARB and diuretics including spironolactone and torsemide.  Follow metabolic panel. 4. History of A. fib on digoxin and beta-blockers and apixaban.  Check digoxin levels.  Follow metabolic panel closely. 5. History of dementia per the chart presently not on any medication. 6. CAD has not had any chest pain.  Check troponin.  On apixaban and beta-blockers. 7. Previous history of stroke. 8. Diabetes mellitus type 2 we will keep patient on sliding scale coverage. 9. Normocytic normochromic anemia follow CBC.  Given that patient has acute respiratory failure requiring 100% nonrebreather with Covid infection will need more than 2 midnight stay in inpatient status.  Will need to revisit about patient's CODE STATUS and get more further detail history when patient's family available.  Note that I am holding off patient's ARB spironolactone and torsemide for for now due to low normal blood pressure.  Also due to worsening renal function.  Patient's beta-blocker dose may need to be held if blood pressure further drops.   DVT prophylaxis: Apixaban. Code Status: Full code. Family Communication: Unable to  reach family. Disposition Plan: To be determined. Consults called: None. Admission status: Inpatient.   Rise Patience MD [name] Pager [phone].  If 7PM-7AM, please contact night-coverage [email or web address] Password [facility]  05/30/2019, 5:15 AM

## 2019-05-30 NOTE — Telephone Encounter (Signed)
Received call from daughter [name] this morning expressing concerns that patient is being transferred to [facility] for covid.  She believes that his heart failure will be over looked. She states that he had similar symptoms last year and he did not have covid.  Reassured her his heart failure will not be over looked and there is a complete team in [place] that will coordinate his care.  D/w Amy NP and agrees.  Daughter verbalized understanding and appreciation for the call back.

## 2019-05-30 NOTE — Progress Notes (Addendum)
Same day note  Patient seen and examined at bedside.  Patient was admitted to the hospital for shortness of breath requiring nonrebreather mask.  Chest x-ray showing worsening infiltrates.  Patient tested COVID-19 positive.  At the time of my evaluation, was minimally verbal.  On physical examination  patient was confused, on nonrebreather mask.  Minimally verbal.  Thinly built.  decreased breath sounds bilaterally.  Sternal scar noted. Trace peripheral edema.  Squeezing hand on verbal command.  Laboratory data and imaging over the last 24 hours was reviewed  Assessment and Plan. Acute hypoxic respiratory failure with hypoxia secondary to COVID-19 pneumonia.  Follow inflammatory markers.  Continue IV remdesivir and dexamethasone.  Acute encephalopathy from hypoxia and pneumonia.  CT head showed old infarcts in the brain.  Acute on chronic kidney disease stage III.  Off ARB spironolactone and torsemide at this time.  Need to closely monitor.  History of atrial fibrillation and stroke in the past.  On beta-blockers and apixaban.  Digoxin level.  Diabetes mellitus type 2.  Continue sliding scale insulin.  Patient is awaiting for transfer to [place] for further care and already has a bed.  No Charge  Signed,  Delila Pereyra, MD [name]

## 2019-05-30 NOTE — Telephone Encounter (Signed)
pt's daughter called to state that pt is in ED and the symptoms are the same as what he had last year form the hospital admission - tested COVID positive - daughter feels that [facility] are not taking the corrct steps for her father - she is concerned that no pulmonary doctor is seeing him before he is being transferred to [facility] [place] - she is wanting there hospitalists team to look him over before he is transferred- stating that the symptoms are the same and she feels that they are not doing the correct tests - CB#  [phone]

## 2019-05-30 NOTE — Progress Notes (Signed)
PROGRESS NOTE    [name]  ZSW:[id number] DOB: Sep 16, 1945 DOA: 05/30/2019 PCP: Jeoffrey Massed, MD    Brief Narrative:  73 year old male who presented with dyspnea.  He does have significant past medical history for coronary disease, systolic heart failure status post AICD, atrial fibrillation, chronic kidney disease stage III, type 2 diabetes mellitus, dementia, and status post bioprosthetic mitral valve replacement.    Patient was diagnosed with COVID-19 pneumonia 12/18, in the emergency department, and he was discharged home for outpatient therapy.  Unfortunately his symptoms continue to progress, with persistent and severe dyspnea, associated with confusion. In the emergency department patient required a nonrebreather mask to maintain oxygen saturation more than 88%, his temperature was 98.5, blood pressure 117/59, pulse rate 59, respiratory rate 23, his lungs had no rhonchi or rales, heart S1-S2 present, abdomen soft, no lower extremity edema. Sodium 136, potassium 3.9, chloride 94, bicarb 28, glucose 250, BUN 36, creatinine 1.8, WBC 9.0, hemoglobin 10.8, hematocrit 35.4, platelets 183.  His a chest radiograph had worsening bibasilar reticulonodular infiltrates, small left pleural effusion.  EKG 62 bpm, right axis deviation, ventricular paced rhythm, positive PVC, no ST segment or T wave changes.  Patient was admitted to the hospital with a working diagnosis of acute hypoxic respiratory failure due to SARS COVID-19 viral pneumonia.   Assessment & Plan:   Principal Problem:   Acute respiratory failure due to COVID-19 [facility]) Active Problems:   Right atrial thrombus (HCC)   PVD (peripheral vascular disease) (HCC)   Mixed Ischemic and Nonischemic Cardiomyopathy   Stroke (HCC)   Dementia (HCC)   CAD (coronary artery disease)   Chronic atrial fibrillation (HCC)   CKD (chronic kidney disease) stage 3, GFR 30-59 ml/min   COPD with chronic bronchitis and emphysema (HCC)   Acute  respiratory failure with hypoxia (HCC)   1.  Acute hypoxic respiratory failure due to SARS COVID-19 viral pneumonia.   RR: 21  Pulse oxymetry: 96%  Fi02: 2 L/ min   . COVID-19 Labs  Recent Labs    05/30/19 0147  DDIMER 0.47  FERRITIN 180  LDH 214*  CRP 6.1*    No results found for: SARSCOV2NAA  Continue medical therapy with dexamethasone and remdesivir.  Continue bronchodilator therapy  2.  Acute on chronic systolic heart failure.  Patient does have symptoms of volume overload, continue carvedilol and digoxin, will add furosemide intravenously 60 mg twice daily.  3.  Atrial fibrillation.  Continue rate control with AV blockade and digoxin.  Continue anticoagulation with apixaban.  4.  Type 2 diabetes mellitus.  Continue glucose control with insulin sliding scale.  5.  Metabolic encephalopathy.  Patient does have dementia, unclear baseline, continue neurochecks per unit protocol, aspiration precautions.   DVT prophylaxis: apixaban   Code Status:  full Family Communication:  I spoke with patient's daughter  Disposition Plan/ discharge barriers:  Pending improvement.    Nutrition Status:           Skin Documentation:     Consultants:     Procedures:     Antimicrobials:       Subjective: Patient is somnolent and hyperreactive.  Objective: Vitals:   05/30/19 0945 05/30/19 1015 05/30/19 1120 05/30/19 1520  BP: (!) 115/57 (!) 112/56 (!) 107/57 118/79  Pulse: (!) 59 61 60 60  Resp: 17 19 18  (!) 21  Temp:   (!) 96.5 F (35.8 C) (!) 97.3 F (36.3 C)  TempSrc:   Axillary Oral  SpO2: 98% 99%  95% 96%    Intake/Output Summary (Last 24 hours) at 05/30/2019 1532 Last data filed at 05/30/2019 1200 Gross per 24 hour  Intake 288.88 ml  Output 350 ml  Net -61.12 ml   There were no vitals filed for this visit.  Examination:   General: deconditioned  Neurology: Awake and alert, non focal  E ENT: no pallor, no icterus, oral mucosa  moist Cardiovascular: Positive JVD. S1-S2 present, rhythmic, no gallops, rubs, or murmurs. Trace  lower extremity edema. Pulmonary: positive breath sounds bilaterally, poor ventilation. Gastrointestinal. Abdomen with no organomegaly, non tender, no rebound or guarding. Large ventral hernia.  Skin. No rashes Musculoskeletal: no joint deformities     Data Reviewed: I have personally reviewed following labs and imaging studies  CBC: Recent Labs  Lab 05/26/19 1430 05/26/19 1617 05/26/19 1625 05/30/19 0147 05/30/19 0240  WBC 10.0  --   --  9.0  --   NEUTROABS 7.1  --   --  6.0  --   HGB 11.7* 13.3 12.9* 10.8* 11.9*  HCT 38.1* 39.0 38.0* 35.4* 35.0*  MCV 92.0  --   --  91.9  --   PLT 171  --   --  183  --    Basic Metabolic Panel: Recent Labs  Lab 05/26/19 1430 05/26/19 1617 05/26/19 1625 05/30/19 0147 05/30/19 0240  NA 132* 135 134* 136 135  K 5.8* 5.2* 5.3* 3.9 3.8  CL 95* 96*  --  94*  --   CO2 24  --   --  28  --   GLUCOSE 394* 363*  --  250*  --   BUN 43* 59*  --  36*  --   CREATININE 1.59* 1.40*  --  1.80*  --   CALCIUM 8.3*  --   --  8.8*  --    GFR: CrCl cannot be calculated (Unknown ideal weight.). Liver Function Tests: Recent Labs  Lab 05/26/19 1430 05/30/19 0147  AST 43* 21  ALT 14 13  ALKPHOS 67 65  BILITOT 1.4* 0.5  PROT 7.1 6.9  ALBUMIN 3.2* 3.0*   No results for input(s): LIPASE, AMYLASE in the last 168 hours. Recent Labs  Lab 05/26/19 1527  AMMONIA 33   Coagulation Profile: No results for input(s): INR, PROTIME in the last 168 hours. Cardiac Enzymes: No results for input(s): CKTOTAL, CKMB, CKMBINDEX, TROPONINI in the last 168 hours. BNP (last 3 results) No results for input(s): PROBNP in the last 8760 hours. HbA1C: No results for input(s): HGBA1C in the last 72 hours. CBG: Recent Labs  Lab 05/30/19 0848 05/30/19 1117  GLUCAP 164* 212*   Lipid Profile: Recent Labs    05/30/19 0147  TRIG 242*   Thyroid Function Tests: No  results for input(s): TSH, T4TOTAL, FREET4, T3FREE, THYROIDAB in the last 72 hours. Anemia Panel: Recent Labs    05/30/19 0147  FERRITIN 180      Radiology Studies: I have reviewed all of the imaging during this hospital visit personally     Scheduled Meds: . apixaban  5 mg Oral BID  . arformoterol  15 mcg Nebulization BID  . atorvastatin  40 mg Oral QPM  . carvedilol  6.25 mg Oral BID WC  . dexamethasone (DECADRON) injection  6 mg Intravenous Daily  . digoxin  0.125 mg Oral Daily  . insulin aspart  0-9 Units Subcutaneous TID WC  . lactulose  30 g Oral Daily  . thiamine  100 mg Oral QHS   Continuous Infusions: Marland Kitchen [  START ON 05/31/2019] remdesivir 100 mg in NS 100 mL       LOS: 0 days        Darthy Manganelli Gerome Apley, MD

## 2019-05-30 NOTE — ED Notes (Signed)
ED TO INPATIENT HANDOFF REPORT  ED Nurse Name and Phone #: Mickeal Skinnerhoebe, [phone]  S Name/Age/Gender [name] 73 y.o. male Room/Bed: 023C/023C  Code Status   Code Status: Full Code  Home/SNF/Other Home Patient oriented to: self, place, time and situation Is this baseline? Yes   Triage Complete: Triage complete  Chief Complaint Acute respiratory failure with hypoxia (HCC) [J96.01]  Triage Note Pt brought to ED by [facility] from home for SOB and AMS. Pt recently diagnosed with COVID 19.  On EMS arrival to his house pt was very lethargic with SPO2 88% on home 2L O2, placed on NRM, pt more alert and SPO2 increase to 95%. Paced rhythm, BP 104/64, HR 65. Pt is AO x 4, NAD noticed during triage.    Allergies Allergies  Allergen Reactions  . Warfarin Other (See Comments)    Non compliance and ETOH abuse    Level of Care/Admitting Diagnosis ED Disposition    ED Disposition Condition Comment   Admit  Hospital Area: [facility] [[id number]]  Level of Care: Progressive [102]  Covid Evaluation: Confirmed COVID Positive  Diagnosis: Acute respiratory failure with hypoxia [facility](HCC) [[id number]]) [[id number]]  Admitting Physician: Eduard ClosKAKRAKANDY, ARSHAD N [phone][[id number]]  Attending Physician: Eduard ClosKAKRAKANDY, ARSHAD N [phone][[id number]]  Estimated length of stay: past midnight tomorrow  Certification:: I certify this patient will need inpatient services for at least 2 midnights       B Medical/Surgery History Past Medical History:  Diagnosis Date  . AICD (automatic cardioverter/defibrillator) present   . Alcohol abuse   . Anxiety   . Bipolar disorder (HCC)   . Biventricular automatic implantable cardioverter defibrillator in situ    a. 01/2014 s/p MDT [id number] Viva XT CRT-D (ser # [id number]).  . CAD (coronary artery disease)    a. s/p prior PCI/stenting of the LCX;  b. 08/2014 MV: EF 20%, large septal, apical, and inferior infarct from apex to base, no ischemia;  c. 08/2014 NSTEMI/Cath: LM mod distal dzs extending into  ostial LCX (80%), LAD tortuous, RI nl, OM mod dzs, RCA 100 CTO with R->R and L->R collats-->Med Rx.  . CHF (congestive heart failure) (HCC)   . Chronic atrial fibrillation (HCC)    a. CHA2DS2VASc = 6->eliquis;  b. S/P AVN RFCA an BiV ICD placement.  . Chronic renal insufficiency, stage 3 (moderate) 2020   GFR low 40s (estimated baseline sCr 1.5)  . Chronic respiratory failure (HCC)    hypoxic and hypercarbic  . Chronic systolic congestive heart failure (HCC)    a. 01/2014 Echo: EF 25-30%, mod conc LVH, mod dil LA.  Marland Kitchen. Complete heart block (HCC)    a. In setting of prior AV nodal ablation r/t afib-->BiV ICD (01/2014).  . COPD (chronic obstructive pulmonary disease) (HCC)   . COVID-19 virus infection 05/26/2019  . CVA (cerebral vascular accident) (HCC)    a. Multiple prior embolic strokes.  . Dementia (HCC)    suspect vascular  . Depression   . Diabetes mellitus (HCC)   . DVT (deep venous thrombosis) (HCC)   . Hepatic steatosis    w/out imaging evidence of cirrhosis on u/s or CT scan (as of 01/2019).  +hx of elevated ammonia.  . Hypertension   . Mitral valve disease    a. remote mitral replacement with Bjork Shiley valve;  b. 06/2006 Redo MVR with tissue valve.  . Mixed Ischemic and Nonischemic Cardiomyopathy    a. 8/.2015 Echo: Ef 25-30%;  b. 01/2014 s/p MDT [id number] Coletta MemosViva XT CRT-D (ser #  [id number]).  . On home oxygen therapy    "2L; 24/7" (65/16/2019)  . Psoriasis    "back of his head; elbows; finger of left hand" (06/07/2017)  . Pure hypercholesterolemia   . S/P AV nodal ablation   . Seborrheic dermatitis   . Tobacco dependence    current as of 07/2018  . Ventral hernia    Past Surgical History:  Procedure Laterality Date  . AV NODE ABLATION  2007  . CARDIAC CATHETERIZATION  06/2006   Mild ostial L main stenosis, CFX stent patent, RCA occluded (old)  . CORONARY ARTERY BYPASS GRAFT  2008  . CYSTOSCOPY W/ URETERAL STENT PLACEMENT  07/12/2011   Procedure: CYSTOSCOPY WITH RETROGRADE  PYELOGRAM/URETERAL STENT PLACEMENT;  Surgeon: Sebastian Acheheodore Manny, MD;  [facility];  Service: Urology;  Laterality: Left;  . ECHO DOPPLER COMPLETE(TRANSTHOR) ([facility] HX)  04/07/2017   LV EF: 25%-30%  . EEG  2019 and 12/22/18   2019 abnl->no seizures, not on seizure meds (as of 01/2019).  Rpt EEG 12/2018 no epileptiform activity.  . EXPLORATORY LAPAROTOMY    . HERNIA REPAIR    . ICD GENERATOR CHANGE  02/01/2014   Gen change to: Medtronic VIVA pulse generator, serial number [id number]  . INSERT / REPLACE / REMOVE PACEMAKER    . LEFT HEART CATHETERIZATION WITH CORONARY ANGIOGRAM N/A 08/23/2014   Procedure: LEFT HEART CATHETERIZATION WITH CORONARY ANGIOGRAM;  Surgeon: Corky CraftsJayadeep S Varanasi, MD;  [facility];  Service: Cardiovascular;  Laterality: N/A;  . MITRAL VALVE REPLACEMENT     [facility] [id number] with redo tissue valve 06/2006  . PACEMAKER GENERATOR CHANGE N/A 02/01/2014   Procedure: PACEMAKER GENERATOR CHANGE;  Surgeon: Duke SalviaSteven C Klein, MD;  [facility];  Service: Cardiovascular;  Laterality: N/A;  . PACEMAKER PLACEMENT  2006   Changed to CRT-D in 2009  . RIGHT/LEFT HEART CATH AND CORONARY ANGIOGRAPHY N/A 06/09/2017   Procedure: RIGHT/LEFT HEART CATH AND CORONARY ANGIOGRAPHY;  Surgeon: Laurey MoraleMcLean, Dalton S, MD;  [facility];  Service: Cardiovascular;  Laterality: N/A;  . TRANSTHORACIC ECHOCARDIOGRAM  10/2017   EF 30-35%, inf-lat and inf akin, MV bioprosth ok, mod dec RV fxn, pulm HTN (peak 65).  [name]. VASECTOMY       A IV Location/Drains/Wounds Patient Lines/Drains/Airways Status   Active Line/Drains/Airways    Name:   Placement date:   Placement time:   Site:   Days:   Peripheral IV 05/30/19 Right Antecubital   05/30/19    0200    Antecubital   less than 1   Peripheral IV 05/30/19 Right Hand   05/30/19    --    Hand   less than 1          Intake/Output Last 24 hours No intake or output data in the 24 hours ending 05/30/19  0538  Labs/Imaging Results for orders placed or performed during the hospital encounter of 05/30/19 (from the past 48 hour(s))  CBC with Differential     Status: Abnormal   Collection Time: 05/30/19  1:47 AM  Result Value Ref Range   WBC 9.0 4.0 - 10.5 K/uL   RBC 3.85 (L) 4.22 - 5.81 MIL/uL   Hemoglobin 10.8 (L) 13.0 - 17.0 g/dL   HCT 82.935.4 (L) 56.239.0 - 13.052.0 %   MCV 91.9 80.0 - 100.0 fL   MCH 28.1 26.0 - 34.0 pg   MCHC 30.5 30.0 - 36.0 g/dL   RDW 86.513.6 78.411.5 - 69.615.5 %  Platelets 183 150 - 400 K/uL   nRBC 0.0 0.0 - 0.2 %   Neutrophils Relative % 67 %   Neutro Abs 6.0 1.7 - 7.7 K/uL   Lymphocytes Relative 14 %   Lymphs Abs 1.2 0.7 - 4.0 K/uL   Monocytes Relative 17 %   Monocytes Absolute 1.5 (H) 0.1 - 1.0 K/uL   Eosinophils Relative 1 %   Eosinophils Absolute 0.1 0.0 - 0.5 K/uL   Basophils Relative 0 %   Basophils Absolute 0.0 0.0 - 0.1 K/uL   Immature Granulocytes 1 %   Abs Immature Granulocytes 0.12 (H) 0.00 - 0.07 K/uL    Comment: Performed at [facility], [address] [place], [place] [address]  Comprehensive metabolic panel     Status: Abnormal   Collection Time: 05/30/19  1:47 AM  Result Value Ref Range   Sodium 136 135 - 145 mmol/L   Potassium 3.9 3.5 - 5.1 mmol/L   Chloride 94 (L) 98 - 111 mmol/L   CO2 28 22 - 32 mmol/L   Glucose, Bld 250 (H) 70 - 99 mg/dL   BUN 36 (H) 8 - 23 mg/dL   Creatinine, Ser 7.37 (H) 0.61 - 1.24 mg/dL   Calcium 8.8 (L) 8.9 - 10.3 mg/dL   Total Protein 6.9 6.5 - 8.1 g/dL   Albumin 3.0 (L) 3.5 - 5.0 g/dL   AST 21 15 - 41 U/L   ALT 13 0 - 44 U/L   Alkaline Phosphatase 65 38 - 126 U/L   Total Bilirubin 0.5 0.3 - 1.2 mg/dL   GFR calc non Af Amer 37 (L) >60 mL/min   GFR calc Af Amer 43 (L) >60 mL/min   Anion gap 14 5 - 15    Comment: Performed at [facility], [address] [place], [place] [address]  Troponin I (High Sensitivity)     Status: Abnormal   Collection Time: 05/30/19  1:47 AM  Result Value Ref Range   Troponin I  (High Sensitivity) 76 (H) <18 ng/L    Comment: (NOTE) Elevated high sensitivity troponin I (hsTnI) values and significant  changes across serial measurements may suggest ACS but many other  chronic and acute conditions are known to elevate hsTnI results.  Refer to the "Links" section for chest pain algorithms and additional  guidance. Performed at [facility], [address] [place], [place] [address]   Brain natriuretic peptide     Status: Abnormal   Collection Time: 05/30/19  1:47 AM  Result Value Ref Range   B Natriuretic Peptide 229.2 (H) 0.0 - 100.0 pg/mL    Comment: Performed at [facility], [address], [place], [place] [address]  Lactic acid, plasma     Status: None   Collection Time: 05/30/19  1:47 AM  Result Value Ref Range   Lactic Acid, Venous 1.5 0.5 - 1.9 mmol/L    Comment: Performed at [facility], [address], [place], [place] [address]  D-dimer, quantitative     Status: None   Collection Time: 05/30/19  1:47 AM  Result Value Ref Range   D-Dimer, Quant 0.47 0.00 - 0.50 ug/mL-FEU    Comment: (NOTE) At the manufacturer cut-off of 0.50 ug/mL FEU, this assay has been documented to exclude PE with a sensitivity and negative predictive value of 97 to 99%.  At this time, this assay has not been approved by the FDA to exclude DVT/VTE. Results should be correlated  with clinical presentation. Performed at [facility], [address] [place], [place] [address]   Lactate dehydrogenase     Status: Abnormal   Collection Time: 05/30/19  1:47 AM  Result Value Ref Range   LDH 214 (H) 98 - 192 U/L    Comment: Performed at [facility], [address], [place], [place] [address]  Triglycerides     Status: Abnormal   Collection Time: 05/30/19  1:47 AM  Result Value Ref Range   Triglycerides 242 (H) <150 mg/dL    Comment: Performed at [facility], [address], [place], [place] [address]  Fibrinogen     Status: Abnormal    Collection Time: 05/30/19  1:47 AM  Result Value Ref Range   Fibrinogen 684 (H) 210 - 475 mg/dL    Comment: Performed at [facility], [address] [place], [place] [address]  C-reactive protein     Status: Abnormal   Collection Time: 05/30/19  1:47 AM  Result Value Ref Range   CRP 6.1 (H) <1.0 mg/dL    Comment: Performed at [facility], [address] [place], [place] [address]  Ferritin     Status: None   Collection Time: 05/30/19  1:47 AM  Result Value Ref Range   Ferritin 180 24 - 336 ng/mL    Comment: Performed at [facility], [address] [place], [place] [address]  POCT I-Stat EG7     Status: Abnormal   Collection Time: 05/30/19  2:40 AM  Result Value Ref Range   pH, Ven 7.430 7.250 - 7.430   pCO2, Ven 48.6 44.0 - 60.0 mmHg   pO2, Ven 126.0 (H) 32.0 - 45.0 mmHg   Bicarbonate 32.2 (H) 20.0 - 28.0 mmol/L   TCO2 34 (H) 22 - 32 mmol/L   O2 Saturation 99.0 %   Acid-Base Excess 7.0 (H) 0.0 - 2.0 mmol/L   Sodium 135 135 - 145 mmol/L   Potassium 3.8 3.5 - 5.1 mmol/L   Calcium, Ion 1.10 (L) 1.15 - 1.40 mmol/L   HCT 35.0 (L) 39.0 - 52.0 %   Hemoglobin 11.9 (L) 13.0 - 17.0 g/dL   Patient temperature HIDE    Sample type VENOUS    DG Chest Portable 1 View  Result Date: 05/30/2019 CLINICAL DATA:  Shortness of breath EXAM: PORTABLE CHEST 1 VIEW COMPARISON:  May 26, 2019 FINDINGS: There is mild cardiomegaly. A left-sided AICD is seen with the lead tips in the right atrium and right ventricle. Prosthetic cardiac valve is seen. Overlying median sternotomy wires. There is interval slight worsening in the hazy/interstitial airspace opacity at both lung bases. There is central pulmonary vascular congestion. Pleural base calcification at the left lung base. Elevation of the right hemidiaphragm. No acute osseous abnormality. IMPRESSION: Cardiomegaly Slight interval worsening in the interstitial/hazy airspace opacities at both lung bases which could be due to  worsening in interstitial edema and/or early infectious etiology. Electronically Signed   By: Jonna Clark M.D.   On: 05/30/2019 02:20    Pending Labs Unresulted Labs (From admission, onward)    Start     Ordered   05/30/19 0352  Procalcitonin  Once,   R     05/30/19 0352   05/30/19 0221  Lactic acid, plasma  Now then every 2 hours,   STAT     05/30/19 0223   05/30/19 0221  Blood Culture (routine x 2)  BLOOD CULTURE X 2,  STAT     05/30/19 0223          Vitals/Pain Today's Vitals   05/30/19 0445 05/30/19 0500 05/30/19 0515 05/30/19 0530  BP: (!) 99/53 103/69 103/61 97/61  Pulse:    (!) 59  Resp: (!) 23 19 (!) 21 (!) 21  Temp:      TempSrc:      SpO2:   100% 100%  PainSc:        Isolation Precautions Airborne and Contact precautions  Medications Medications  atorvastatin (LIPITOR) tablet 40 mg (has no administration in time range)  carvedilol (COREG) tablet 6.25 mg (has no administration in time range)  digoxin (LANOXIN) tablet 0.125 mg (has no administration in time range)  losartan (COZAAR) tablet 25 mg (has no administration in time range)  nitroGLYCERIN (NITROSTAT) SL tablet 0.4 mg (has no administration in time range)  spironolactone (ALDACTONE) tablet 25 mg (has no administration in time range)  torsemide (DEMADEX) tablet 80 mg (has no administration in time range)  lactulose (CHRONULAC) 10 GM/15ML solution 30 g (has no administration in time range)  apixaban (ELIQUIS) tablet 5 mg (has no administration in time range)  thiamine tablet 100 mg (has no administration in time range)  arformoterol (BROVANA) nebulizer solution 15 mcg (has no administration in time range)  acetaminophen (TYLENOL) tablet 650 mg (has no administration in time range)    Or  acetaminophen (TYLENOL) suppository 650 mg (has no administration in time range)  ondansetron (ZOFRAN) tablet 4 mg (has no administration in time range)    Or  ondansetron (ZOFRAN) injection 4 mg (has no  administration in time range)  insulin aspart (novoLOG) injection 0-9 Units (has no administration in time range)  remdesivir 200 mg in sodium chloride 0.9% 250 mL IVPB (has no administration in time range)    Followed by  remdesivir 100 mg in sodium chloride 0.9 % 100 mL IVPB (has no administration in time range)  dexamethasone (DECADRON) injection 6 mg (has no administration in time range)    Mobility walks Low fall risk   Focused Assessments    R Recommendations: See Admitting Provider Note  Report given to:   Additional Notes:

## 2019-05-31 ENCOUNTER — Ambulatory Visit: Payer: Medicare Other | Admitting: Family Medicine

## 2019-05-31 DIAGNOSIS — N1831 Chronic kidney disease, stage 3a: Secondary | ICD-10-CM

## 2019-05-31 LAB — COMPREHENSIVE METABOLIC PANEL
ALT: 17 U/L (ref 0–44)
AST: 26 U/L (ref 15–41)
Albumin: 3 g/dL — ABNORMAL LOW (ref 3.5–5.0)
Alkaline Phosphatase: 74 U/L (ref 38–126)
Anion gap: 15 (ref 5–15)
BUN: 57 mg/dL — ABNORMAL HIGH (ref 8–23)
CO2: 27 mmol/L (ref 22–32)
Calcium: 9.2 mg/dL (ref 8.9–10.3)
Chloride: 95 mmol/L — ABNORMAL LOW (ref 98–111)
Creatinine, Ser: 1.5 mg/dL — ABNORMAL HIGH (ref 0.61–1.24)
GFR calc Af Amer: 54 mL/min — ABNORMAL LOW (ref >60)
GFR calc non Af Amer: 46 mL/min — ABNORMAL LOW (ref >60)
Glucose, Bld: 413 mg/dL — ABNORMAL HIGH (ref 70–99)
Potassium: 5.1 mmol/L (ref 3.5–5.1)
Sodium: 137 mmol/L (ref 135–145)
Total Bilirubin: 0.3 mg/dL (ref 0.3–1.2)
Total Protein: 7.5 g/dL (ref 6.5–8.1)

## 2019-05-31 LAB — GLUCOSE, CAPILLARY
Glucose-Capillary: 288 mg/dL — ABNORMAL HIGH (ref 70–99)
Glucose-Capillary: 395 mg/dL — ABNORMAL HIGH (ref 70–99)
Glucose-Capillary: 432 mg/dL — ABNORMAL HIGH (ref 70–99)
Glucose-Capillary: 441 mg/dL — ABNORMAL HIGH (ref 70–99)
Glucose-Capillary: 462 mg/dL — ABNORMAL HIGH (ref 70–99)
Glucose-Capillary: 474 mg/dL — ABNORMAL HIGH (ref 70–99)
Glucose-Capillary: 512 mg/dL (ref 70–99)

## 2019-05-31 LAB — D-DIMER, QUANTITATIVE: D-Dimer, Quant: 0.7 ug/mL-FEU — ABNORMAL HIGH (ref 0.00–0.50)

## 2019-05-31 LAB — HEMOGLOBIN A1C
Hgb A1c MFr Bld: 11.1 % — ABNORMAL HIGH (ref 4.8–5.6)
Mean Plasma Glucose: 271.87 mg/dL

## 2019-05-31 LAB — FERRITIN: Ferritin: 203 ng/mL (ref 24–336)

## 2019-05-31 LAB — C-REACTIVE PROTEIN: CRP: 7 mg/dL — ABNORMAL HIGH (ref <1.0)

## 2019-05-31 LAB — MRSA PCR SCREENING: MRSA by PCR: NEGATIVE

## 2019-05-31 MED ORDER — ENSURE ENLIVE PO LIQD
237.0000 mL | Freq: Two times a day (BID) | ORAL | Status: DC
  Administered 2019-05-31: 237 mL via ORAL

## 2019-05-31 MED ORDER — TOCILIZUMAB 400 MG/20ML IV SOLN
600.0000 mg | Freq: Once | INTRAVENOUS | Status: AC
  Administered 2019-05-31: 18:00:00 600 mg via INTRAVENOUS
  Filled 2019-05-31: qty 10

## 2019-05-31 MED ORDER — INSULIN ASPART 100 UNIT/ML ~~LOC~~ SOLN
9.0000 [IU] | Freq: Once | SUBCUTANEOUS | Status: AC
  Administered 2019-05-31: 9 [IU] via SUBCUTANEOUS

## 2019-05-31 MED ORDER — GLUCERNA SHAKE PO LIQD
237.0000 mL | Freq: Every day | ORAL | Status: DC
  Administered 2019-06-01 – 2019-06-04 (×4): 237 mL via ORAL

## 2019-05-31 MED ORDER — INSULIN ASPART 100 UNIT/ML ~~LOC~~ SOLN
0.0000 [IU] | SUBCUTANEOUS | Status: DC
  Administered 2019-05-31 (×2): 20 [IU] via SUBCUTANEOUS
  Administered 2019-05-31: 25 [IU] via SUBCUTANEOUS
  Administered 2019-05-31: 20 [IU] via SUBCUTANEOUS
  Administered 2019-06-01: 7 [IU] via SUBCUTANEOUS
  Administered 2019-06-01: 4 [IU] via SUBCUTANEOUS

## 2019-05-31 MED ORDER — GLUCERNA PO LIQD: 237.0000 mL | Freq: Every day | ORAL | Status: DC

## 2019-05-31 MED ORDER — INSULIN GLARGINE 100 UNIT/ML ~~LOC~~ SOLN
10.0000 [IU] | Freq: Every day | SUBCUTANEOUS | Status: DC
  Administered 2019-05-31 – 2019-06-01 (×2): 10 [IU] via SUBCUTANEOUS
  Filled 2019-05-31 (×2): qty 0.1

## 2019-05-31 MED ORDER — ADULT MULTIVITAMIN W/MINERALS CH
1.0000 | ORAL_TABLET | Freq: Every day | ORAL | Status: DC
  Administered 2019-05-31 – 2019-06-14 (×15): 1 via ORAL
  Filled 2019-05-31 (×15): qty 1

## 2019-05-31 NOTE — Progress Notes (Signed)
PROGRESS NOTE  [name] OZH:[id number] DOB: Aug 22, 1945 DOA: 05/30/2019 PCP: Jeoffrey Massed, MD  HPI/Recap of past 26 hours: 73 year old male with past medical history of CAD, chronic systolic heart failure status post ICD and pacemaker, atrial fibrillation, stage III CKD, dementia, diabetes mellitus and bioprosthetic mitral valve replacement brought into the emergency room on the early morning of 12/22 with worsening shortness of breath.  Patient had previously been in the emergency room 2 days prior, diagnosed with Covid pneumonia and sent home given that his oxygen was saturation was stable at that time.  This time he was found to be hypoxic and requiring initially a nonrebreather.  Patient was started on IV Decadron and Remdisivir.  Admitted to the hospitalist service.  He was also noted to be confused, but CT scan of the head noted no evidence of any acute findings.  His CRP initially was at 6.1.  Today, patient doing a bit better.  Renal function improved.  He has had significant increase in his CBGs, felt to be secondary to steroids requiring increases in his Lantus.  His CRP is also slightly increased up to 7 and ferritin as well although still in the normal range.  Patient himself slightly confused, but appears to be calm with no complaints.  Assessment/Plan: Principal Problem:   Acute respiratory failure with hypoxia due to COVID-19 [facility]) & acute on chronic diastolic heart failure plus history of COPD: Less oxygen requirements this morning.  Continue steroids, remdesivir.  Noted CRP Increased & now at 7.0.  Have discussed with daughter and will give Actemra. Active Problems:   Right atrial thrombus (HCC)/chronic atrial fibrillation: Continue Eliquis.  Rate controlled.   PVD (peripheral vascular disease) (HCC)   Mixed Ischemic and Nonischemic Cardiomyopathy   Stroke (HCC)   Dementia (HCC) w/acute behavioral disturbance: Likely from hypoxia.    CAD (coronary artery disease)  Chronic atrial fibrillation (HCC) AKI in the setting of CKD (chronic kidney disease) stage 3, GFR 30-59 ml/min: Much improved today.  Code Status: Full code   Family Communication: D/w daughter by phone.  We reviewed Actemra and she did give consent to give.  Disposition Plan: potential discharge home once he is able to be weaned off oxygen    Consultants:  None   Procedures:  none   Antimicrobials:  IV Remdisivir 12/22-present  Actemra X1 dose 12/23   DVT prophylaxis: Eliquis   Objective: Vitals:   05/31/19 0734 05/31/19 1232  BP:  (!) 134/41  Pulse:    Resp:  (!) 24  Temp: 97.9 F (36.6 C) 97.6 F (36.4 C)  SpO2:  96%    Intake/Output Summary (Last 24 hours) at 05/31/2019 1345 Last data filed at 05/31/2019 1300 Gross per 24 hour  Intake 1100 ml  Output 1502 ml  Net -402 ml   There were no vitals filed for this visit. Body mass index is 27.46 kg/m.  Exam:   General: Alert and oriented x2, no acute distress  HEENT: Normocephalic and atraumatic, mucous membranes are slightly dry  Neck: Supple, no JVD  Cardiovascular: Irregular rhythm, rate controlled  Respiratory: Clear to auscultation bilaterally  Abdomen: Soft, nontender, nondistended, positive bowel sounds  Musculoskeletal: No clubbing or cyanosis, trace pitting edema  Skin: No skin breaks, tears or lesions  Psychiatry: Appropriate, no evidence of acute psychoses   Data Reviewed: CBC: Recent Labs  Lab 05/26/19 1430 05/26/19 1617 05/26/19 1625 05/30/19 0147 05/30/19 0240  WBC 10.0  --   --  9.0  --  NEUTROABS 7.1  --   --  6.0  --   HGB 11.7* 13.3 12.9* 10.8* 11.9*  HCT 38.1* 39.0 38.0* 35.4* 35.0*  MCV 92.0  --   --  91.9  --   PLT 171  --   --  183  --    Basic Metabolic Panel: Recent Labs  Lab 05/26/19 1430 05/26/19 1617 05/26/19 1625 05/30/19 0147 05/30/19 0240 05/30/19 2220 05/31/19 0420  NA 132* 135 134* 136 135  --  137  K 5.8* 5.2* 5.3* 3.9 3.8  --  5.1  CL  95* 96*  --  94*  --   --  95*  CO2 24  --   --  28  --   --  27  GLUCOSE 394* 363*  --  250*  --  524* 413*  BUN 43* 59*  --  36*  --   --  57*  CREATININE 1.59* 1.40*  --  1.80*  --   --  1.50*  CALCIUM 8.3*  --   --  8.8*  --   --  9.2   GFR: CrCl cannot be calculated (Unknown ideal weight.). Liver Function Tests: Recent Labs  Lab 05/26/19 1430 05/30/19 0147 05/31/19 0420  AST 43* 21 26  ALT 14 13 17   ALKPHOS 67 65 74  BILITOT 1.4* 0.5 0.3  PROT 7.1 6.9 7.5  ALBUMIN 3.2* 3.0* 3.0*   No results for input(s): LIPASE, AMYLASE in the last 168 hours. Recent Labs  Lab 05/26/19 1527  AMMONIA 33   Coagulation Profile: No results for input(s): INR, PROTIME in the last 168 hours. Cardiac Enzymes: No results for input(s): CKTOTAL, CKMB, CKMBINDEX, TROPONINI in the last 168 hours. BNP (last 3 results) No results for input(s): PROBNP in the last 8760 hours. HbA1C: Recent Labs    05/30/19 2220  HGBA1C 11.1*   CBG: Recent Labs  Lab 05/31/19 0031 05/31/19 0203 05/31/19 0324 05/31/19 0846 05/31/19 1226  GLUCAP 474* 441* 395* 288* 512*   Lipid Profile: Recent Labs    05/30/19 0147  TRIG 242*   Thyroid Function Tests: No results for input(s): TSH, T4TOTAL, FREET4, T3FREE, THYROIDAB in the last 72 hours. Anemia Panel: Recent Labs    05/30/19 0147 05/31/19 0420  FERRITIN 180 203   Urine analysis:    Component Value Date/Time   COLORURINE STRAW (A) 05/26/2019 1639   APPEARANCEUR CLEAR 05/26/2019 1639   LABSPEC 1.011 05/26/2019 1639   PHURINE 6.0 05/26/2019 1639   GLUCOSEU >=500 (A) 05/26/2019 1639   HGBUR SMALL (A) 05/26/2019 1639   BILIRUBINUR NEGATIVE 05/26/2019 1639   KETONESUR NEGATIVE 05/26/2019 1639   PROTEINUR NEGATIVE 05/26/2019 1639   UROBILINOGEN 0.2 12/24/2014 0311   NITRITE NEGATIVE 05/26/2019 1639   LEUKOCYTESUR NEGATIVE 05/26/2019 1639   Sepsis Labs: @LABRCNTIP (procalcitonin:4,lacticidven:4)  ) Recent Results (from the past 240 hour(s))    Blood Culture (routine x 2)     Status: None (Preliminary result)   Collection Time: 05/30/19  2:00 AM   Specimen: BLOOD RIGHT HAND  Result Value Ref Range Status   Specimen Description BLOOD RIGHT HAND  Final   Special Requests   Final    BOTTLES DRAWN AEROBIC AND ANAEROBIC Blood Culture adequate volume   Culture   Final    NO GROWTH 1 DAY Performed at [facility], [address], [place], [address] [place]    Report Status PENDING  Incomplete  Blood Culture (routine x 2)     Status:  None (Preliminary result)   Collection Time: 05/30/19  2:00 AM   Specimen: BLOOD  Result Value Ref Range Status   Specimen Description BLOOD RIGHT ANTECUBITAL  Final   Special Requests   Final    BOTTLES DRAWN AEROBIC AND ANAEROBIC Blood Culture results may not be optimal due to an excessive volume of blood received in culture bottles   Culture   Final    NO GROWTH 1 DAY Performed at [facility] [address] [place], [place] [address]    Report Status PENDING  Incomplete      Studies: No results found.  Scheduled Meds: . apixaban  5 mg Oral BID  . arformoterol  15 mcg Nebulization BID  . atorvastatin  40 mg Oral QPM  . carvedilol  6.25 mg Oral BID WC  . dexamethasone (DECADRON) injection  6 mg Intravenous Daily  . digoxin  0.125 mg Oral Daily  . furosemide  60 mg Intravenous BID  . insulin aspart  0-20 Units Subcutaneous Q4H  . insulin glargine  10 Units Subcutaneous Daily  . lactulose  30 g Oral Daily  . thiamine  100 mg Oral QHS    Continuous Infusions: . remdesivir 100 mg in NS 100 mL 100 mg (05/31/19 0917)     LOS: 1 day     Annita Brod, MD [name]  To reach me or the doctor on call, go to: [email or web address] Password [email or web address]  05/31/2019, 1:45 PM

## 2019-05-31 NOTE — Progress Notes (Signed)
Initial Nutrition Assessment  DOCUMENTATION CODES:   Not applicable  INTERVENTION:    Ensure Enlive po BID, each supplement provides 350 kcal and 20 grams of protein  MVI daily   NUTRITION DIAGNOSIS:   Increased nutrient needs related to acute illness as evidenced by estimated needs.  GOAL:   Patient will meet greater than or equal to 90% of their needs  MONITOR:   PO intake, Supplement acceptance, Weight trends, Labs, I & O's  REASON FOR ASSESSMENT:   Malnutrition Screening Tool    ASSESSMENT:   Patient with PMH significant for COPD, CAD, CHF s/p ICD and pacemaker, CKD III, dementia, DM, and MVR replacement. Presents this admission with acute respiratory failure with hypoxia 2/2 to COVID-19.   RD working remotely.  Unable to reach pt by phone. Meal completions charted as 100% for his last two meals. RD to provide supplements to maximize kcal and protein.   Pt would benefit from nutrient dense supplement. One Ensure Enlive supplement provides 350 kcals, 20 grams protein, and 44-45 grams of carbohydrate vs one Glucerna shake supplement, which provides 220 kcals, 10 grams of protein, and 26 grams of carbohydrate. Given pt's hx of DM, RD will continue to monitor PO intake, CBGS, and adjust supplement regimen as appropriate.   Need recent admission weight to assess for weight loss.   I/O: -223 ml since admit UOP: 1,200 ml x 24 hrs    Medications: decadron, 60 mg lasix BID, SS novolog, lantus, thiamine lactulose Labs: CBG 272-536  Diet Order:   Diet Order            Diet heart healthy/carb modified Room service appropriate? Yes; Fluid consistency: Thin; Fluid restriction: 1200 mL Fluid  Diet effective now              EDUCATION NEEDS:   Not appropriate for education at this time  Skin:  Skin Assessment: Reviewed RN Assessment  Last BM:  12/22  Height:   Ht Readings from Last 1 Encounters:  05/30/19 5\' 4"  (1.626 m)    Weight:   Wt Readings from  Last 1 Encounters:  11/28/18 72.6 kg    Ideal Body Weight:  59.1 kg  BMI:  Body mass index is 27.46 kg/m.  Estimated Nutritional Needs:   Kcal:  2000-2200 kcal  Protein:  100-120 grams  Fluid:  >/= 2 L/day   Mariana Single RD, LDN Clinical Nutrition Pager # - [phone]

## 2019-05-31 NOTE — Progress Notes (Signed)
Inpatient Diabetes Program Recommendations  AACE/ADA: New Consensus Statement on Inpatient Glycemic Control (2015)  Target Ranges:  Prepandial:   less than 140 mg/dL      Peak postprandial:   less than 180 mg/dL (1-2 hours)      Critically ill patients:  140 - 180 mg/dL   Lab Results  Component Value Date   GLUCAP 288 (H) 05/31/2019   HGBA1C 11.1 (H) 05/30/2019    Review of Glycemic Control  Diabetes history: DM2 Outpatient Diabetes medications: Jardiance 10 + Glucotrol 5 mg q am + 2.5 mg q pm + ? Insulin (insulin pen needles listed but no insulin on home list Current orders for Inpatient glycemic control: Novolog sensitive correction tid + hs 0-5 units  Inpatient Diabetes Program Recommendations:   -Add Lantus 10 units daily -Increase Novolog correction to moderate tid + hs 0-5 units  Thank you, Bethena Roys E. Lorris Carducci, RN, MSN, CDE  Diabetes Coordinator Inpatient Glycemic Control Team Team Pager [phone] (8am-5pm) 05/31/2019 11:09 AM

## 2019-06-01 LAB — BASIC METABOLIC PANEL
Anion gap: 14 (ref 5–15)
BUN: 80 mg/dL — ABNORMAL HIGH (ref 8–23)
CO2: 31 mmol/L (ref 22–32)
Calcium: 9.7 mg/dL (ref 8.9–10.3)
Chloride: 89 mmol/L — ABNORMAL LOW (ref 98–111)
Creatinine, Ser: 1.81 mg/dL — ABNORMAL HIGH (ref 0.61–1.24)
GFR calc Af Amer: 43 mL/min — ABNORMAL LOW (ref >60)
GFR calc non Af Amer: 37 mL/min — ABNORMAL LOW (ref >60)
Glucose, Bld: 657 mg/dL (ref 70–99)
Potassium: 5.4 mmol/L — ABNORMAL HIGH (ref 3.5–5.1)
Sodium: 134 mmol/L — ABNORMAL LOW (ref 135–145)

## 2019-06-01 LAB — C-REACTIVE PROTEIN: CRP: 4.8 mg/dL — ABNORMAL HIGH (ref <1.0)

## 2019-06-01 LAB — FERRITIN: Ferritin: 215 ng/mL (ref 24–336)

## 2019-06-01 LAB — GLUCOSE, CAPILLARY
Glucose-Capillary: 158 mg/dL — ABNORMAL HIGH (ref 70–99)
Glucose-Capillary: 221 mg/dL — ABNORMAL HIGH (ref 70–99)
Glucose-Capillary: 387 mg/dL — ABNORMAL HIGH (ref 70–99)
Glucose-Capillary: 407 mg/dL — ABNORMAL HIGH (ref 70–99)
Glucose-Capillary: 495 mg/dL — ABNORMAL HIGH (ref 70–99)
Glucose-Capillary: 556 mg/dL (ref 70–99)

## 2019-06-01 LAB — COMPREHENSIVE METABOLIC PANEL
ALT: 18 U/L (ref 0–44)
AST: 19 U/L (ref 15–41)
Albumin: 3 g/dL — ABNORMAL LOW (ref 3.5–5.0)
Alkaline Phosphatase: 76 U/L (ref 38–126)
Anion gap: 14 (ref 5–15)
BUN: 74 mg/dL — ABNORMAL HIGH (ref 8–23)
CO2: 30 mmol/L (ref 22–32)
Calcium: 9.5 mg/dL (ref 8.9–10.3)
Chloride: 92 mmol/L — ABNORMAL LOW (ref 98–111)
Creatinine, Ser: 1.55 mg/dL — ABNORMAL HIGH (ref 0.61–1.24)
GFR calc Af Amer: 51 mL/min — ABNORMAL LOW (ref >60)
GFR calc non Af Amer: 44 mL/min — ABNORMAL LOW (ref >60)
Glucose, Bld: 402 mg/dL — ABNORMAL HIGH (ref 70–99)
Potassium: 4.6 mmol/L (ref 3.5–5.1)
Sodium: 136 mmol/L (ref 135–145)
Total Bilirubin: 0.4 mg/dL (ref 0.3–1.2)
Total Protein: 7.2 g/dL (ref 6.5–8.1)

## 2019-06-01 LAB — D-DIMER, QUANTITATIVE: D-Dimer, Quant: 0.57 ug/mL-FEU — ABNORMAL HIGH (ref 0.00–0.50)

## 2019-06-01 MED ORDER — INSULIN ASPART 100 UNIT/ML ~~LOC~~ SOLN
0.0000 [IU] | Freq: Three times a day (TID) | SUBCUTANEOUS | Status: DC
  Administered 2019-06-01 (×2): 15 [IU] via SUBCUTANEOUS

## 2019-06-01 MED ORDER — INSULIN ASPART 100 UNIT/ML ~~LOC~~ SOLN
10.0000 [IU] | Freq: Once | SUBCUTANEOUS | Status: AC
  Administered 2019-06-01: 10 [IU] via SUBCUTANEOUS

## 2019-06-01 MED ORDER — INSULIN ASPART 100 UNIT/ML ~~LOC~~ SOLN
0.0000 [IU] | Freq: Every day | SUBCUTANEOUS | Status: DC
  Administered 2019-06-01: 5 [IU] via SUBCUTANEOUS

## 2019-06-01 MED ORDER — INSULIN REGULAR HUMAN 100 UNIT/ML IJ SOLN: 10.0000 [IU] | Freq: Once | INTRAMUSCULAR | Status: DC

## 2019-06-01 MED ORDER — INSULIN GLARGINE 100 UNIT/ML ~~LOC~~ SOLN
20.0000 [IU] | Freq: Every day | SUBCUTANEOUS | Status: DC
  Filled 2019-06-01: qty 0.2

## 2019-06-01 MED ORDER — INSULIN ASPART 100 UNIT/ML ~~LOC~~ SOLN
15.0000 [IU] | Freq: Once | SUBCUTANEOUS | Status: AC
  Administered 2019-06-01: 15 [IU] via SUBCUTANEOUS

## 2019-06-01 MED ORDER — INSULIN GLARGINE 100 UNIT/ML ~~LOC~~ SOLN
10.0000 [IU] | SUBCUTANEOUS | Status: AC
  Administered 2019-06-01: 10 [IU] via SUBCUTANEOUS
  Filled 2019-06-01: qty 0.1

## 2019-06-01 NOTE — Plan of Care (Signed)
Care plans that are considered unmet are due to patients dementia and confusion, unable to understand or be educated.  Some care plans were completed as it is no longer appropriate and some not applicable because patient can not understand certain education materials.    Problem: Education: Goal: Ability to describe self-care measures that may prevent or decrease complications (Diabetes Survival Skills Education) will improve Outcome: Progressing   Problem: Coping: Goal: Ability to adjust to condition or change in health will improve Outcome: Progressing   Problem: Fluid Volume: Goal: Ability to maintain a balanced intake and output will improve Outcome: Progressing   Problem: Cardiac: Goal: Ability to achieve and maintain adequate cardiopulmonary perfusion will improve Outcome: Progressing   Problem: Coping: Goal: Ability to adjust to condition or change in health will improve Outcome: Progressing   Problem: Fluid Volume: Goal: Ability to maintain a balanced intake and output will improve Outcome: Progressing   Problem: Metabolic: Goal: Ability to maintain appropriate glucose levels will improve Outcome: Progressing   Problem: Nutritional: Goal: Maintenance of adequate nutrition will improve Outcome: Progressing Goal: Progress toward achieving an optimal weight will improve Outcome: Progressing   Problem: Skin Integrity: Goal: Risk for impaired skin integrity will decrease Outcome: Progressing   Problem: Tissue Perfusion: Goal: Adequacy of tissue perfusion will improve Outcome: Progressing   Problem: Coping: Goal: Psychosocial and spiritual needs will be supported Outcome: Progressing   Problem: Respiratory: Goal: Will maintain a patent airway Outcome: Progressing Goal: Complications related to the disease process, condition or treatment will be avoided or minimized Outcome: Progressing   Problem: Activity: Goal: Capacity to carry out activities will  improve Outcome: Progressing   Problem: Cardiac: Goal: Ability to achieve and maintain adequate cardiopulmonary perfusion will improve Outcome: Progressing   Problem: Education: Goal: Ability to describe self-care measures that may prevent or decrease complications (Diabetes Survival Skills Education) will improve Outcome: Not Met (add Reason)   Problem: Health Behavior/Discharge Planning: Goal: Ability to identify and utilize available resources and services will improve Outcome: Not Met (add Reason) Goal: Ability to manage health-related needs will improve Outcome: Not Met (add Reason)   Problem: Education: Goal: Knowledge of risk factors and measures for prevention of condition will improve Outcome: Not Met (add Reason)   Problem: Education: Goal: Ability to demonstrate management of disease process will improve Outcome: Not Met (add Reason) Goal: Ability to verbalize understanding of medication therapies will improve Outcome: Not Met (add Reason)   Problem: Education: Goal: Knowledge of cardiac device and self-care will improve Outcome: Completed/Met Goal: Ability to safely manage health related needs after discharge will improve Outcome: Completed/Met   Problem: Education: Goal: Individualized Educational Video(s) Outcome: Not Applicable   Problem: Education: Goal: Individualized Educational Video(s) Outcome: Not Applicable   Problem: Education: Goal: Individualized Educational Video(s) Outcome: Not Applicable   Problem: Education: Goal: Individualized Educational Video(s) Outcome: Not Applicable

## 2019-06-01 NOTE — Progress Notes (Addendum)
PROGRESS NOTE  [name] HMC:[id number] DOB: April 08, 1946 DOA: 05/30/2019 PCP: Jeoffrey Massed, MD  HPI/Recap of past 13 hours: 73 year old male with past medical history of CAD, chronic systolic heart failure status post ICD and pacemaker, atrial fibrillation, stage III CKD, dementia, diabetes mellitus and bioprosthetic mitral valve replacement brought into the emergency room on the early morning of 12/22 with worsening shortness of breath.  Patient had previously been in the emergency room 2 days prior, diagnosed with Covid pneumonia and sent home given that his oxygen was saturation was stable at that time.  This time he was found to be hypoxic and requiring initially a nonrebreather.  Patient was started on IV Decadron and Remdisivir.  Admitted to the hospitalist service.  He was also noted to be confused, but CT scan of the head noted no evidence of any acute findings.  His CRP initially was at 6.1.  Patient doing okay.  Had elevated CBGs due to steroids and also poorly controlled diabetes mellitus with CBGs ranging in the 400's-500s.  Increased his Lantus.  Oxygenation continues to improve, down to 2 L.  Assessment/Plan: Principal Problem:   Acute respiratory failure with hypoxia due to COVID-19 [facility]) & acute on chronic diastolic heart failure plus history of COPD: Less oxygen requirements this morning.  Continue steroids, remdesivir.  CRP initially increased to 7.0.  Have discussed with daughter and patient received Actemra on 12/23.  Today, CRP down to 4.8 Active Problems:   Right atrial thrombus (HCC)/chronic atrial fibrillation: Continue Eliquis.  Rate controlled.   PVD (peripheral vascular disease) (HCC)   Mixed Ischemic and Nonischemic Cardiomyopathy   Stroke (HCC)   Dementia (HCC) w/acute behavioral disturbance: Likely from hypoxia.  Appears to be back at his baseline.   CAD (coronary artery disease)   Chronic atrial fibrillation (HCC) AKI in the setting of CKD (chronic  kidney disease) stage 3, GFR 30-59 ml/min: Improved, now back to baseline  Code Status: Full code   Family Communication: Left message for daughter  Disposition Plan: potential discharge home once he is able to be weaned off oxygen    Consultants:  None   Procedures:  none   Antimicrobials:  IV Remdisivir 12/22-present  Actemra X1 dose 12/23   DVT prophylaxis: Eliquis   Objective: Vitals:   06/01/19 0400 06/01/19 0732  BP:  119/62  Pulse: 62 66  Resp: 17 (!) 25  Temp: 98 F (36.7 C) 97.9 F (36.6 C)  SpO2: 96% 96%    Intake/Output Summary (Last 24 hours) at 06/01/2019 1142 Last data filed at 06/01/2019 0930 Gross per 24 hour  Intake 1650 ml  Output 1550 ml  Net 100 ml   Filed Weights   05/31/19 1551 06/01/19 0500  Weight: 58.5 kg 60.3 kg   Body mass index is 22.82 kg/m.  Exam:   General: Alert and oriented x2, no acute distress  HEENT: Normocephalic and atraumatic, mucous membranes are slightly dry  Neck: Supple, no JVD  Cardiovascular: Irregular rhythm, rate controlled  Respiratory: Clear to auscultation bilaterally  Abdomen: Soft, nontender, nondistended, positive bowel sounds  Musculoskeletal: No clubbing or cyanosis, trace pitting edema  Skin: No skin breaks, tears or lesions  Psychiatry: Appropriate, no evidence of acute psychoses   Data Reviewed: CBC: Recent Labs  Lab 05/26/19 1430 05/26/19 1617 05/26/19 1625 05/30/19 0147 05/30/19 0240  WBC 10.0  --   --  9.0  --   NEUTROABS 7.1  --   --  6.0  --  HGB 11.7* 13.3 12.9* 10.8* 11.9*  HCT 38.1* 39.0 38.0* 35.4* 35.0*  MCV 92.0  --   --  91.9  --   PLT 171  --   --  183  --    Basic Metabolic Panel: Recent Labs  Lab 05/26/19 1430 05/26/19 1617 05/26/19 1625 05/30/19 0147 05/30/19 0240 05/30/19 2220 05/31/19 0420 06/01/19 0040  NA 132* 135 134* 136 135  --  137 136  K 5.8* 5.2* 5.3* 3.9 3.8  --  5.1 4.6  CL 95* 96*  --  94*  --   --  95* 92*  CO2 24  --   --   28  --   --  27 30  GLUCOSE 394* 363*  --  250*  --  524* 413* 402*  BUN 43* 59*  --  36*  --   --  57* 74*  CREATININE 1.59* 1.40*  --  1.80*  --   --  1.50* 1.55*  CALCIUM 8.3*  --   --  8.8*  --   --  9.2 9.5   GFR: Estimated Creatinine Clearance: 36.6 mL/min (A) (by C-G formula based on SCr of 1.55 mg/dL (H)). Liver Function Tests: Recent Labs  Lab 05/26/19 1430 05/30/19 0147 05/31/19 0420 06/01/19 0040  AST 43* 21 26 19   ALT 14 13 17 18   ALKPHOS 67 65 74 76  BILITOT 1.4* 0.5 0.3 0.4  PROT 7.1 6.9 7.5 7.2  ALBUMIN 3.2* 3.0* 3.0* 3.0*   No results for input(s): LIPASE, AMYLASE in the last 168 hours. Recent Labs  Lab 05/26/19 1527  AMMONIA 33   Coagulation Profile: No results for input(s): INR, PROTIME in the last 168 hours. Cardiac Enzymes: No results for input(s): CKTOTAL, CKMB, CKMBINDEX, TROPONINI in the last 168 hours. BNP (last 3 results) No results for input(s): PROBNP in the last 8760 hours. HbA1C: Recent Labs    05/30/19 2220  HGBA1C 11.1*   CBG: Recent Labs  Lab 05/31/19 2011 05/31/19 2336 06/01/19 0045 06/01/19 0402 06/01/19 0730  GLUCAP 556* 432* 387* 221* 158*   Lipid Profile: Recent Labs    05/30/19 0147  TRIG 242*   Thyroid Function Tests: No results for input(s): TSH, T4TOTAL, FREET4, T3FREE, THYROIDAB in the last 72 hours. Anemia Panel: Recent Labs    05/31/19 0420 06/01/19 0040  FERRITIN 203 215   Urine analysis:    Component Value Date/Time   COLORURINE STRAW (A) 05/26/2019 1639   APPEARANCEUR CLEAR 05/26/2019 1639   LABSPEC 1.011 05/26/2019 1639   PHURINE 6.0 05/26/2019 1639   GLUCOSEU >=500 (A) 05/26/2019 1639   HGBUR SMALL (A) 05/26/2019 1639   BILIRUBINUR NEGATIVE 05/26/2019 1639   KETONESUR NEGATIVE 05/26/2019 1639   PROTEINUR NEGATIVE 05/26/2019 1639   UROBILINOGEN 0.2 12/24/2014 0311   NITRITE NEGATIVE 05/26/2019 1639   LEUKOCYTESUR NEGATIVE 05/26/2019 1639   Sepsis  Labs: @LABRCNTIP (procalcitonin:4,lacticidven:4)  ) Recent Results (from the past 240 hour(s))  Blood Culture (routine x 2)     Status: None (Preliminary result)   Collection Time: 05/30/19  2:00 AM   Specimen: BLOOD RIGHT HAND  Result Value Ref Range Status   Specimen Description BLOOD RIGHT HAND  Final   Special Requests   Final    BOTTLES DRAWN AEROBIC AND ANAEROBIC Blood Culture adequate volume   Culture   Final    NO GROWTH 2 DAYS Performed at [name], [address], [place], [place] [address]    Report Status PENDING  Incomplete  Blood Culture (routine x 2)     Status: None (Preliminary result)   Collection Time: 05/30/19  2:00 AM   Specimen: BLOOD  Result Value Ref Range Status   Specimen Description BLOOD RIGHT ANTECUBITAL  Final   Special Requests   Final    BOTTLES DRAWN AEROBIC AND ANAEROBIC Blood Culture results may not be optimal due to an excessive volume of blood received in culture bottles   Culture   Final    NO GROWTH 2 DAYS Performed at [facility], [place] [address], [place], [place] [address]    Report Status PENDING  Incomplete  MRSA PCR Screening     Status: None   Collection Time: 05/31/19  3:41 PM   Specimen: Nasopharyngeal  Result Value Ref Range Status   MRSA by PCR NEGATIVE NEGATIVE Final    Comment:        The GeneXpert MRSA Assay (FDA approved for NASAL specimens only), is one component of a comprehensive MRSA colonization surveillance program. It is not intended to diagnose MRSA infection nor to guide or monitor treatment for MRSA infections. Performed at [facility], [place] [address] [place], [place] [address]       Studies: No results found.  Scheduled Meds: . apixaban  5 mg Oral BID  . arformoterol  15 mcg Nebulization BID  . atorvastatin  40 mg Oral QPM  . carvedilol  6.25 mg Oral BID WC  . dexamethasone (DECADRON) injection  6 mg Intravenous Daily  . digoxin  0.125 mg Oral Daily  . feeding  supplement (GLUCERNA SHAKE)  237 mL Oral Daily  . furosemide  60 mg Intravenous BID  . insulin aspart  0-15 Units Subcutaneous TID WC  . insulin aspart  0-5 Units Subcutaneous QHS  . insulin glargine  10 Units Subcutaneous Daily  . lactulose  30 g Oral Daily  . multivitamin with minerals  1 tablet Oral Daily  . thiamine  100 mg Oral QHS    Continuous Infusions: . remdesivir 100 mg in NS 100 mL 100 mg (06/01/19 1014)     LOS: 2 days     Annita Brod, MD [name]  To reach me or the doctor on call, go to: [email or web address] Password [facility]  06/01/2019, 11:42 AM

## 2019-06-02 DIAGNOSIS — E1122 Type 2 diabetes mellitus with diabetic chronic kidney disease: Secondary | ICD-10-CM

## 2019-06-02 DIAGNOSIS — IMO0002 Reserved for concepts with insufficient information to code with codable children: Secondary | ICD-10-CM

## 2019-06-02 LAB — COMPREHENSIVE METABOLIC PANEL
ALT: 16 U/L (ref 0–44)
AST: 19 U/L (ref 15–41)
Albumin: 3.4 g/dL — ABNORMAL LOW (ref 3.5–5.0)
Alkaline Phosphatase: 78 U/L (ref 38–126)
Anion gap: 14 (ref 5–15)
BUN: 83 mg/dL — ABNORMAL HIGH (ref 8–23)
CO2: 30 mmol/L (ref 22–32)
Calcium: 9.5 mg/dL (ref 8.9–10.3)
Chloride: 92 mmol/L — ABNORMAL LOW (ref 98–111)
Creatinine, Ser: 1.67 mg/dL — ABNORMAL HIGH (ref 0.61–1.24)
GFR calc Af Amer: 47 mL/min — ABNORMAL LOW (ref >60)
GFR calc non Af Amer: 41 mL/min — ABNORMAL LOW (ref >60)
Glucose, Bld: 412 mg/dL — ABNORMAL HIGH (ref 70–99)
Potassium: 4.7 mmol/L (ref 3.5–5.1)
Sodium: 136 mmol/L (ref 135–145)
Total Bilirubin: 0.6 mg/dL (ref 0.3–1.2)
Total Protein: 7.9 g/dL (ref 6.5–8.1)

## 2019-06-02 LAB — GLUCOSE, CAPILLARY
Glucose-Capillary: 330 mg/dL — ABNORMAL HIGH (ref 70–99)
Glucose-Capillary: 425 mg/dL — ABNORMAL HIGH (ref 70–99)
Glucose-Capillary: 491 mg/dL — ABNORMAL HIGH (ref 70–99)
Glucose-Capillary: 533 mg/dL (ref 70–99)

## 2019-06-02 LAB — C-REACTIVE PROTEIN: CRP: 3.1 mg/dL — ABNORMAL HIGH (ref <1.0)

## 2019-06-02 LAB — FERRITIN: Ferritin: 256 ng/mL (ref 24–336)

## 2019-06-02 LAB — D-DIMER, QUANTITATIVE: D-Dimer, Quant: 0.58 ug/mL-FEU — ABNORMAL HIGH (ref 0.00–0.50)

## 2019-06-02 MED ORDER — INSULIN ASPART 100 UNIT/ML ~~LOC~~ SOLN
0.0000 [IU] | SUBCUTANEOUS | Status: DC
  Administered 2019-06-02: 25 [IU] via SUBCUTANEOUS
  Administered 2019-06-02: 15 [IU] via SUBCUTANEOUS
  Administered 2019-06-02: 20 [IU] via SUBCUTANEOUS
  Administered 2019-06-02: 25 [IU] via SUBCUTANEOUS
  Administered 2019-06-03 (×2): 20 [IU] via SUBCUTANEOUS
  Administered 2019-06-03: 3 [IU] via SUBCUTANEOUS
  Administered 2019-06-03: 30 [IU] via SUBCUTANEOUS
  Administered 2019-06-03: 7 [IU] via SUBCUTANEOUS
  Administered 2019-06-03: 20 [IU] via SUBCUTANEOUS
  Administered 2019-06-03: 15 [IU] via SUBCUTANEOUS
  Administered 2019-06-04: 04:00:00 11 [IU] via SUBCUTANEOUS
  Administered 2019-06-04: 08:00:00 3 [IU] via SUBCUTANEOUS
  Administered 2019-06-04: 7 [IU] via SUBCUTANEOUS

## 2019-06-02 MED ORDER — INSULIN GLARGINE 100 UNIT/ML ~~LOC~~ SOLN
30.0000 [IU] | Freq: Every day | SUBCUTANEOUS | Status: DC
  Administered 2019-06-02 – 2019-06-04 (×3): 30 [IU] via SUBCUTANEOUS
  Filled 2019-06-02 (×4): qty 0.3

## 2019-06-02 NOTE — Progress Notes (Addendum)
2030 - Critical lab result received.Pt's blood glucose was 657 @18 :40 lab draw. Pt's BG was > 600 on 2000 finger stick.   2046- Md notified and ordered 10u of novolog. discussed w/ overnight MD about a possible insulin gtt.   2300- BG rechecked and MD informed, no additional orders received.

## 2019-06-02 NOTE — Progress Notes (Signed)
PROGRESS NOTE  [name] DZH:[id number] DOB: 11-23-1945 DOA: 05/30/2019 PCP: Tammi Sou, MD  HPI/Recap of past 4 hours: 73 year old male with past medical history of CAD, chronic systolic heart failure status post ICD and pacemaker, atrial fibrillation, stage III CKD, dementia, diabetes mellitus and bioprosthetic mitral valve replacement brought into the emergency room on the early morning of 12/22 with worsening shortness of breath.  Patient had previously been in the emergency room 2 days prior, diagnosed with Covid pneumonia and sent home given that his oxygen was saturation was stable at that time.  This time he was found to be hypoxic and requiring initially a nonrebreather.  Patient was started on IV Decadron and Remdisivir.  Admitted to the hospitalist service.  He was also noted to be confused, but CT scan of the head noted no evidence of any acute findings.  His CRP initially was at 6.1.  For the past few days, patient has had elevated CBGs due to steroids and also poorly controlled diabetes mellitus with CBGs ranging in the 400's-500s.  Underlying A1c elevated at 11 already before his steroids.  Continuing to increase his Lantus in response.  Additionally, oxygenation doing well, able to wean off oxygen  Assessment/Plan: Principal Problem:   Acute respiratory failure with hypoxia due to COVID-19 [facility]) & acute on chronic diastolic heart failure plus history of COPD: Able to wean off oxygen as of 12/25.  Continue steroids, remdesivir.  CRP initially increased to 7.0.  Have discussed with daughter and patient received Actemra on 12/23.  Today, CRP continues to trend downward. Active Problems:   Right atrial thrombus (HCC)/chronic atrial fibrillation: Continue Eliquis.  Rate controlled.   PVD (peripheral vascular disease) ([place])   Mixed Ischemic and Nonischemic Cardiomyopathy   Stroke ([place])   Dementia ([place]) w/acute behavioral disturbance: Likely from hypoxia.  Appears to be  back at his baseline.   CAD (coronary artery disease)   Chronic atrial fibrillation (HCC) AKI in the setting of CKD (chronic kidney disease) stage 3, GFR 30-59 ml/min: Improved, now back to baseline  Uncontrolled diabetes mellitus type 2 on insulin with stage III chronic kidney disease: CBG still quite high.  Continue to increase his basal insulin.  Have changed his sliding scale to resistant every 4 hours instead of with meals.  Code Status: Full code   Family Communication: Updated daughter by phone  Disposition Plan: Potential discharge in the next few days once we are able to get his blood sugars under control.   Consultants:  None   Procedures:  none   Antimicrobials:  IV Remdisivir 12/22-present  Actemra X1 dose 12/23   DVT prophylaxis: Eliquis   Objective: Vitals:   06/02/19 0814 06/02/19 1113  BP:  128/77  Pulse: 61 (!) 59  Resp: 18 (!) 25  Temp:    SpO2: 93% (!) 89%    Intake/Output Summary (Last 24 hours) at 06/02/2019 1453 Last data filed at 06/02/2019 0500 Gross per 24 hour  Intake 240 ml  Output 1340 ml  Net -1100 ml   Filed Weights   05/31/19 1551 06/01/19 0500  Weight: 58.5 kg 60.3 kg   Body mass index is 22.82 kg/m.  Exam:   General: Alert and oriented x2, no acute distress  HEENT: Normocephalic and atraumatic, mucous membranes are slightly dry  Neck: Supple, no JVD  Cardiovascular: Irregular rhythm, rate controlled  Respiratory: Clear to auscultation bilaterally  Abdomen: Soft, nontender, nondistended, positive bowel sounds  Musculoskeletal: No clubbing or cyanosis, trace  pitting edema  Skin: No skin breaks, tears or lesions  Psychiatry: Appropriate, no evidence of acute psychoses   Data Reviewed: CBC: Recent Labs  Lab 05/26/19 1617 05/26/19 1625 05/30/19 0147 05/30/19 0240  WBC  --   --  9.0  --   NEUTROABS  --   --  6.0  --   HGB 13.3 12.9* 10.8* 11.9*  HCT 39.0 38.0* 35.4* 35.0*  MCV  --   --  91.9  --     PLT  --   --  183  --    Basic Metabolic Panel: Recent Labs  Lab 05/30/19 0147 05/30/19 0240 05/30/19 2220 05/31/19 0420 06/01/19 0040 06/01/19 1835 06/02/19 0140  NA 136 135  --  137 136 134* 136  K 3.9 3.8  --  5.1 4.6 5.4* 4.7  CL 94*  --   --  95* 92* 89* 92*  CO2 28  --   --  27 30 31 30   GLUCOSE 250*  --  524* 413* 402* 657* 412*  BUN 36*  --   --  57* 74* 80* 83*  CREATININE 1.80*  --   --  1.50* 1.55* 1.81* 1.67*  CALCIUM 8.8*  --   --  9.2 9.5 9.7 9.5   GFR: Estimated Creatinine Clearance: 34 mL/min (A) (by C-G formula based on SCr of 1.67 mg/dL (H)). Liver Function Tests: Recent Labs  Lab 05/30/19 0147 05/31/19 0420 06/01/19 0040 06/02/19 0140  AST 21 26 19 19   ALT 13 17 18 16   ALKPHOS 65 74 76 78  BILITOT 0.5 0.3 0.4 0.6  PROT 6.9 7.5 7.2 7.9  ALBUMIN 3.0* 3.0* 3.0* 3.4*   No results for input(s): LIPASE, AMYLASE in the last 168 hours. Recent Labs  Lab 05/26/19 1527  AMMONIA 33   Coagulation Profile: No results for input(s): INR, PROTIME in the last 168 hours. Cardiac Enzymes: No results for input(s): CKTOTAL, CKMB, CKMBINDEX, TROPONINI in the last 168 hours. BNP (last 3 results) No results for input(s): PROBNP in the last 8760 hours. HbA1C: Recent Labs    05/30/19 2220  HGBA1C 11.1*   CBG: Recent Labs  Lab 06/01/19 0730 06/01/19 1144 06/01/19 2321 06/02/19 0811 06/02/19 1112  GLUCAP 158* 407* 495* 330* 425*   Lipid Profile: No results for input(s): CHOL, HDL, LDLCALC, TRIG, CHOLHDL, LDLDIRECT in the last 72 hours. Thyroid Function Tests: No results for input(s): TSH, T4TOTAL, FREET4, T3FREE, THYROIDAB in the last 72 hours. Anemia Panel: Recent Labs    06/01/19 0040 06/02/19 0140  FERRITIN 215 256   Urine analysis:    Component Value Date/Time   COLORURINE STRAW (A) 05/26/2019 1639   APPEARANCEUR CLEAR 05/26/2019 1639   LABSPEC 1.011 05/26/2019 1639   PHURINE 6.0 05/26/2019 1639   GLUCOSEU >=500 (A) 05/26/2019 1639    HGBUR SMALL (A) 05/26/2019 1639   BILIRUBINUR NEGATIVE 05/26/2019 1639   KETONESUR NEGATIVE 05/26/2019 1639   PROTEINUR NEGATIVE 05/26/2019 1639   UROBILINOGEN 0.2 12/24/2014 0311   NITRITE NEGATIVE 05/26/2019 1639   LEUKOCYTESUR NEGATIVE 05/26/2019 1639   Sepsis Labs: @LABRCNTIP (procalcitonin:4,lacticidven:4)  ) Recent Results (from the past 240 hour(s))  Blood Culture (routine x 2)     Status: None (Preliminary result)   Collection Time: 05/30/19  2:00 AM   Specimen: BLOOD RIGHT HAND  Result Value Ref Range Status   Specimen Description BLOOD RIGHT HAND  Final   Special Requests   Final    BOTTLES DRAWN AEROBIC AND ANAEROBIC Blood Culture  adequate volume   Culture   Final    NO GROWTH 3 DAYS Performed at [facility], [address], [place], [place] [address]    Report Status PENDING  Incomplete  Blood Culture (routine x 2)     Status: None (Preliminary result)   Collection Time: 05/30/19  2:00 AM   Specimen: BLOOD  Result Value Ref Range Status   Specimen Description BLOOD RIGHT ANTECUBITAL  Final   Special Requests   Final    BOTTLES DRAWN AEROBIC AND ANAEROBIC Blood Culture results may not be optimal due to an excessive volume of blood received in culture bottles   Culture   Final    NO GROWTH 3 DAYS Performed at [facility], [address], [place], [place] [address]    Report Status PENDING  Incomplete  MRSA PCR Screening     Status: None   Collection Time: 05/31/19  3:41 PM   Specimen: Nasopharyngeal  Result Value Ref Range Status   MRSA by PCR NEGATIVE NEGATIVE Final    Comment:        The GeneXpert MRSA Assay (FDA approved for NASAL specimens only), is one component of a comprehensive MRSA colonization surveillance program. It is not intended to diagnose MRSA infection nor to guide or monitor treatment for MRSA infections. Performed at [facility], [address] [place], [place] [address]       Studies: No results  found.  Scheduled Meds: . apixaban  5 mg Oral BID  . atorvastatin  40 mg Oral QPM  . carvedilol  6.25 mg Oral BID WC  . dexamethasone (DECADRON) injection  6 mg Intravenous Daily  . digoxin  0.125 mg Oral Daily  . feeding supplement (GLUCERNA SHAKE)  237 mL Oral Daily  . furosemide  60 mg Intravenous BID  . insulin aspart  0-20 Units Subcutaneous Q4H  . insulin glargine  30 Units Subcutaneous Daily  . lactulose  30 g Oral Daily  . multivitamin with minerals  1 tablet Oral Daily  . thiamine  100 mg Oral QHS    Continuous Infusions: . remdesivir 100 mg in NS 100 mL 100 mg (06/02/19 0920)     LOS: 3 days     Hollice Espy, MD [name]  To reach me or the doctor on call, go to: [email or web address] Password [facility]  06/02/2019, 2:53 PM

## 2019-06-03 LAB — COMPREHENSIVE METABOLIC PANEL
ALT: 17 U/L (ref 0–44)
AST: 19 U/L (ref 15–41)
Albumin: 3.5 g/dL (ref 3.5–5.0)
Alkaline Phosphatase: 86 U/L (ref 38–126)
Anion gap: 13 (ref 5–15)
BUN: 91 mg/dL — ABNORMAL HIGH (ref 8–23)
CO2: 33 mmol/L — ABNORMAL HIGH (ref 22–32)
Calcium: 10 mg/dL (ref 8.9–10.3)
Chloride: 92 mmol/L — ABNORMAL LOW (ref 98–111)
Creatinine, Ser: 1.49 mg/dL — ABNORMAL HIGH (ref 0.61–1.24)
GFR calc Af Amer: 54 mL/min — ABNORMAL LOW (ref >60)
GFR calc non Af Amer: 47 mL/min — ABNORMAL LOW (ref >60)
Glucose, Bld: 388 mg/dL — ABNORMAL HIGH (ref 70–99)
Potassium: 4.7 mmol/L (ref 3.5–5.1)
Sodium: 138 mmol/L (ref 135–145)
Total Bilirubin: 0.6 mg/dL (ref 0.3–1.2)
Total Protein: 8.2 g/dL — ABNORMAL HIGH (ref 6.5–8.1)

## 2019-06-03 LAB — CBC
HCT: 41 % (ref 39.0–52.0)
Hemoglobin: 12.9 g/dL — ABNORMAL LOW (ref 13.0–17.0)
MCH: 27.8 pg (ref 26.0–34.0)
MCHC: 31.5 g/dL (ref 30.0–36.0)
MCV: 88.4 fL (ref 80.0–100.0)
Platelets: 275 10*3/uL (ref 150–400)
RBC: 4.64 MIL/uL (ref 4.22–5.81)
RDW: 13.2 % (ref 11.5–15.5)
WBC: 18.2 10*3/uL — ABNORMAL HIGH (ref 4.0–10.5)
nRBC: 0 % (ref 0.0–0.2)

## 2019-06-03 LAB — GLUCOSE, CAPILLARY
Glucose-Capillary: 252 mg/dL — ABNORMAL HIGH (ref 70–99)
Glucose-Capillary: 410 mg/dL — ABNORMAL HIGH (ref 70–99)
Glucose-Capillary: 123 mg/dL — ABNORMAL HIGH (ref 70–99)
Glucose-Capillary: 328 mg/dL — ABNORMAL HIGH (ref 70–99)
Glucose-Capillary: 525 mg/dL (ref 70–99)

## 2019-06-03 LAB — D-DIMER, QUANTITATIVE: D-Dimer, Quant: 0.64 ug/mL-FEU — ABNORMAL HIGH (ref 0.00–0.50)

## 2019-06-03 LAB — FERRITIN: Ferritin: 267 ng/mL (ref 24–336)

## 2019-06-03 LAB — GLUCOSE, RANDOM: Glucose, Bld: 737 mg/dL (ref 70–99)

## 2019-06-03 LAB — C-REACTIVE PROTEIN: CRP: 2.7 mg/dL — ABNORMAL HIGH (ref <1.0)

## 2019-06-03 MED ORDER — INSULIN GLARGINE 100 UNIT/ML ~~LOC~~ SOLN
5.0000 [IU] | Freq: Once | SUBCUTANEOUS | Status: AC
  Administered 2019-06-03: 5 [IU] via SUBCUTANEOUS
  Filled 2019-06-03 (×2): qty 0.05

## 2019-06-03 MED ORDER — INSULIN ASPART 100 UNIT/ML ~~LOC~~ SOLN
10.0000 [IU] | Freq: Once | SUBCUTANEOUS | Status: AC
  Administered 2019-06-03: 10 [IU] via INTRAVENOUS

## 2019-06-03 MED ORDER — DEXAMETHASONE SODIUM PHOSPHATE 4 MG/ML IJ SOLN
4.0000 mg | Freq: Every day | INTRAMUSCULAR | Status: DC
  Administered 2019-06-04 – 2019-06-05 (×2): 4 mg via INTRAVENOUS
  Filled 2019-06-03 (×2): qty 1

## 2019-06-03 MED ORDER — INSULIN REGULAR HUMAN 100 UNIT/ML IJ SOLN: 10.0000 [IU] | Freq: Once | INTRAMUSCULAR | Status: DC

## 2019-06-03 NOTE — Plan of Care (Signed)
Received call from lab pt serum glucose 737. Sent message to Dr Vanita Ingles. Awaiting additional orders.

## 2019-06-03 NOTE — Progress Notes (Signed)
Ok to give lantus 5 units x1 per Dr. Lyman Speller.  Onnie Boer, PharmD, BCIDP, AAHIVP, CPP Infectious Disease Pharmacist 06/03/2019 2:48 PM

## 2019-06-03 NOTE — Plan of Care (Signed)
Sent message to Dr Vanita Ingles pt blood glucose reassessed remains greater than 600.

## 2019-06-03 NOTE — Plan of Care (Signed)
Reassessed blood glucose now 525. Sent message to Dr Vanita Ingles.

## 2019-06-03 NOTE — Plan of Care (Signed)
10 units of Novolog given via IV and 20 units sliding scale subcutaneus for a serum glucose of 737. Will reassess in hour.

## 2019-06-03 NOTE — Progress Notes (Signed)
PROGRESS NOTE  KREIG PARSON DPO:[id number] DOB: 03/06/1946 DOA: 05/30/2019 PCP: Tammi Sou, MD  HPI/Recap of past 61 hours: 73 year old male with past medical history of CAD, chronic systolic heart failure status post ICD and pacemaker, atrial fibrillation, stage III CKD, dementia, diabetes mellitus and bioprosthetic mitral valve replacement brought into the emergency room on the early morning of 12/22 with worsening shortness of breath.  Patient had previously been in the emergency room 2 days prior, diagnosed with Covid pneumonia and sent home given that his oxygen was saturation was stable at that time.  This time he was found to be hypoxic and requiring initially a nonrebreather.  Patient was started on IV Decadron and Remdisivir.  Admitted to the hospitalist service.  He was also noted to be confused, but CT scan of the head noted no evidence of any acute findings.  His CRP initially was at 6.1.  For the past few days, patient has had elevated CBGs due to steroids and also poorly controlled diabetes mellitus with CBGs ranging in the 400's-500s.  Underlying A1c elevated at 11 already before his steroids.  Continuing to increase his Lantus in response.  Patient has been able to wean off of oxygen.  He is otherwise doing well with no complaints.  Assessment/Plan: Principal Problem:   Acute respiratory failure with hypoxia due to COVID-19 [facility]) & acute on chronic diastolic heart failure plus history of COPD: Weaned off oxygen as of 12/25.  Continue steroids, and finishes remdesivir 12/26.  CRP initially increased to 7.0.  Have discussed with daughter and patient received Actemra on 12/23.  CRP continues to trend downward. Active Problems:   Right atrial thrombus (HCC)/chronic atrial fibrillation: Continue Eliquis.  Rate controlled.   PVD (peripheral vascular disease) ([place])   Mixed Ischemic and Nonischemic Cardiomyopathy   Stroke ([place])   Dementia ([place]) w/acute behavioral disturbance:  Likely from hypoxia.  Appears to be back at his baseline.   CAD (coronary artery disease)   Chronic atrial fibrillation (HCC) AKI in the setting of CKD (chronic kidney disease) stage 3, GFR 30-59 ml/min: Improved, now back to baseline  Uncontrolled diabetes mellitus type 2 on insulin with stage III chronic kidney disease: CBG still quite high.  Continue to increase his basal insulin.  Have changed his sliding scale to resistant every 4 hours instead of with meals.  Now his CRP is closer to normalizing, will start to scale back steroids.  His A1c is 11.2.  I have had a discussion with his daughter and patient will likely need to go home on insulin which will be determined.  In addition, patient's daughter requests some education about best foods to cook for him.  Chronic systolic heart failure: Continue IV Lasix.  Continues to still have good output on a daily basis.  Will likely change back to p.o. close to discharge.  Code Status: Full code   Family Communication: Updated daughter by phone  Disposition Plan: Anticipate discharge 12/28 or 12/29 once CBGs under control   Consultants:  None   Procedures:  none   Antimicrobials:  IV Remdisivir 12/22-12/26  Actemra X1 dose 12/23   DVT prophylaxis: Eliquis   Objective: Vitals:   06/03/19 0800 06/03/19 1141  BP: 123/82 123/77  Pulse: 60 (!) 59  Resp: (!) 21 (!) 22  Temp: 97.7 F (36.5 C)   SpO2: 95% 94%    Intake/Output Summary (Last 24 hours) at 06/03/2019 1410 Last data filed at 06/03/2019 0832 Gross per 24 hour  Intake 587 ml  Output 1250 ml  Net -663 ml   Filed Weights   05/31/19 1551 06/01/19 0500 06/03/19 0500  Weight: 58.5 kg 60.3 kg 59.1 kg   Body mass index is 22.36 kg/m.  Exam:   General: Alert and oriented x2, no acute distress  HEENT: Normocephalic and atraumatic, mucous membranes are slightly dry  Neck: Supple, no JVD  Cardiovascular: Irregular rhythm, rate controlled  Respiratory: Clear to  auscultation bilaterally  Abdomen: Soft, nontender, nondistended, positive bowel sounds  Musculoskeletal: No clubbing or cyanosis, trace pitting edema  Skin: No skin breaks, tears or lesions  Psychiatry: Appropriate, no evidence of acute psychoses   Data Reviewed: CBC: Recent Labs  Lab 05/30/19 0147 05/30/19 0240 06/03/19 0059  WBC 9.0  --  18.2*  NEUTROABS 6.0  --   --   HGB 10.8* 11.9* 12.9*  HCT 35.4* 35.0* 41.0  MCV 91.9  --  88.4  PLT 183  --  275   Basic Metabolic Panel: Recent Labs  Lab 05/31/19 0420 06/01/19 0040 06/01/19 1835 06/02/19 0140 06/03/19 0059  NA 137 136 134* 136 138  K 5.1 4.6 5.4* 4.7 4.7  CL 95* 92* 89* 92* 92*  CO2 27 30 31 30  33*  GLUCOSE 413* 402* 657* 412* 388*  BUN 57* 74* 80* 83* 91*  CREATININE 1.50* 1.55* 1.81* 1.67* 1.49*  CALCIUM 9.2 9.5 9.7 9.5 10.0   GFR: Estimated Creatinine Clearance: 38 mL/min (A) (by C-G formula based on SCr of 1.49 mg/dL (H)). Liver Function Tests: Recent Labs  Lab 05/30/19 0147 05/31/19 0420 06/01/19 0040 06/02/19 0140 06/03/19 0059  AST 21 26 19 19 19   ALT 13 17 18 16 17   ALKPHOS 65 74 76 78 86  BILITOT 0.5 0.3 0.4 0.6 0.6  PROT 6.9 7.5 7.2 7.9 8.2*  ALBUMIN 3.0* 3.0* 3.0* 3.4* 3.5   No results for input(s): LIPASE, AMYLASE in the last 168 hours. No results for input(s): AMMONIA in the last 168 hours. Coagulation Profile: No results for input(s): INR, PROTIME in the last 168 hours. Cardiac Enzymes: No results for input(s): CKTOTAL, CKMB, CKMBINDEX, TROPONINI in the last 168 hours. BNP (last 3 results) No results for input(s): PROBNP in the last 8760 hours. HbA1C: No results for input(s): HGBA1C in the last 72 hours. CBG: Recent Labs  Lab 06/02/19 2023 06/03/19 0006 06/03/19 0300 06/03/19 0754 06/03/19 1122  GLUCAP 533* 410* 252* 123* 328*   Lipid Profile: No results for input(s): CHOL, HDL, LDLCALC, TRIG, CHOLHDL, LDLDIRECT in the last 72 hours. Thyroid Function Tests: No  results for input(s): TSH, T4TOTAL, FREET4, T3FREE, THYROIDAB in the last 72 hours. Anemia Panel: Recent Labs    06/02/19 0140 06/03/19 0059  FERRITIN 256 267   Urine analysis:    Component Value Date/Time   COLORURINE STRAW (A) 05/26/2019 1639   APPEARANCEUR CLEAR 05/26/2019 1639   LABSPEC 1.011 05/26/2019 1639   PHURINE 6.0 05/26/2019 1639   GLUCOSEU >=500 (A) 05/26/2019 1639   HGBUR SMALL (A) 05/26/2019 1639   BILIRUBINUR NEGATIVE 05/26/2019 1639   KETONESUR NEGATIVE 05/26/2019 1639   PROTEINUR NEGATIVE 05/26/2019 1639   UROBILINOGEN 0.2 12/24/2014 0311   NITRITE NEGATIVE 05/26/2019 1639   LEUKOCYTESUR NEGATIVE 05/26/2019 1639   Sepsis Labs: @LABRCNTIP (procalcitonin:4,lacticidven:4)  ) Recent Results (from the past 240 hour(s))  Blood Culture (routine x 2)     Status: None (Preliminary result)   Collection Time: 05/30/19  2:00 AM   Specimen: BLOOD RIGHT HAND  Result Value  Ref Range Status   Specimen Description BLOOD RIGHT HAND  Final   Special Requests   Final    BOTTLES DRAWN AEROBIC AND ANAEROBIC Blood Culture adequate volume   Culture   Final    NO GROWTH 4 DAYS Performed at [facility], [address] [place], [place] [address]    Report Status PENDING  Incomplete  Blood Culture (routine x 2)     Status: None (Preliminary result)   Collection Time: 05/30/19  2:00 AM   Specimen: BLOOD  Result Value Ref Range Status   Specimen Description BLOOD RIGHT ANTECUBITAL  Final   Special Requests   Final    BOTTLES DRAWN AEROBIC AND ANAEROBIC Blood Culture results may not be optimal due to an excessive volume of blood received in culture bottles   Culture   Final    NO GROWTH 4 DAYS Performed at [facility], [address] [place], [place] [address]    Report Status PENDING  Incomplete  MRSA PCR Screening     Status: None   Collection Time: 05/31/19  3:41 PM   Specimen: Nasopharyngeal  Result Value Ref Range Status   MRSA by PCR NEGATIVE NEGATIVE  Final    Comment:        The GeneXpert MRSA Assay (FDA approved for NASAL specimens only), is one component of a comprehensive MRSA colonization surveillance program. It is not intended to diagnose MRSA infection nor to guide or monitor treatment for MRSA infections. Performed at [facility], [address], [place], [place] [address]       Studies: No results found.  Scheduled Meds: . apixaban  5 mg Oral BID  . atorvastatin  40 mg Oral QPM  . carvedilol  6.25 mg Oral BID WC  . [START ON 06/04/2019] dexamethasone (DECADRON) injection  4 mg Intravenous Daily  . digoxin  0.125 mg Oral Daily  . feeding supplement (GLUCERNA SHAKE)  237 mL Oral Daily  . furosemide  60 mg Intravenous BID  . insulin aspart  0-20 Units Subcutaneous Q4H  . insulin glargine  30 Units Subcutaneous Daily  . lactulose  30 g Oral Daily  . multivitamin with minerals  1 tablet Oral Daily  . thiamine  100 mg Oral QHS    Continuous Infusions:    LOS: 4 days     Hollice Espy, MD [name]  To reach me or the doctor on call, go to: [email or web address] Password [email or web address]  06/03/2019, 2:10 PM

## 2019-06-03 NOTE — Plan of Care (Signed)
Received callback from Dr Vanita Ingles stated to give 2 hours from time of administration of insulin then reassess.

## 2019-06-04 LAB — BASIC METABOLIC PANEL
Anion gap: 13 (ref 5–15)
BUN: 85 mg/dL — ABNORMAL HIGH (ref 8–23)
CO2: 30 mmol/L (ref 22–32)
Calcium: 9.4 mg/dL (ref 8.9–10.3)
Chloride: 92 mmol/L — ABNORMAL LOW (ref 98–111)
Creatinine, Ser: 1.42 mg/dL — ABNORMAL HIGH (ref 0.61–1.24)
GFR calc Af Amer: 57 mL/min — ABNORMAL LOW (ref >60)
GFR calc non Af Amer: 49 mL/min — ABNORMAL LOW (ref >60)
Glucose, Bld: 201 mg/dL — ABNORMAL HIGH (ref 70–99)
Potassium: 5.6 mmol/L — ABNORMAL HIGH (ref 3.5–5.1)
Sodium: 135 mmol/L (ref 135–145)

## 2019-06-04 LAB — CULTURE, BLOOD (ROUTINE X 2)
Culture: NO GROWTH
Culture: NO GROWTH
Special Requests: ADEQUATE

## 2019-06-04 LAB — COMPREHENSIVE METABOLIC PANEL
ALT: 17 U/L (ref 0–44)
AST: 19 U/L (ref 15–41)
Albumin: 3.3 g/dL — ABNORMAL LOW (ref 3.5–5.0)
Alkaline Phosphatase: 72 U/L (ref 38–126)
Anion gap: 20 — ABNORMAL HIGH (ref 5–15)
BUN: 89 mg/dL — ABNORMAL HIGH (ref 8–23)
CO2: 28 mmol/L (ref 22–32)
Calcium: 8.9 mg/dL (ref 8.9–10.3)
Chloride: 86 mmol/L — ABNORMAL LOW (ref 98–111)
Creatinine, Ser: 1.38 mg/dL — ABNORMAL HIGH (ref 0.61–1.24)
GFR calc Af Amer: 59 mL/min — ABNORMAL LOW (ref >60)
GFR calc non Af Amer: 51 mL/min — ABNORMAL LOW (ref >60)
Glucose, Bld: 473 mg/dL — ABNORMAL HIGH (ref 70–99)
Potassium: 4.5 mmol/L (ref 3.5–5.1)
Sodium: 134 mmol/L — ABNORMAL LOW (ref 135–145)
Total Bilirubin: 0.7 mg/dL (ref 0.3–1.2)
Total Protein: 7.8 g/dL (ref 6.5–8.1)

## 2019-06-04 LAB — GLUCOSE, CAPILLARY
Glucose-Capillary: 130 mg/dL — ABNORMAL HIGH (ref 70–99)
Glucose-Capillary: 131 mg/dL — ABNORMAL HIGH (ref 70–99)
Glucose-Capillary: 142 mg/dL — ABNORMAL HIGH (ref 70–99)
Glucose-Capillary: 170 mg/dL — ABNORMAL HIGH (ref 70–99)
Glucose-Capillary: 230 mg/dL — ABNORMAL HIGH (ref 70–99)
Glucose-Capillary: 244 mg/dL — ABNORMAL HIGH (ref 70–99)
Glucose-Capillary: 296 mg/dL — ABNORMAL HIGH (ref 70–99)
Glucose-Capillary: 313 mg/dL — ABNORMAL HIGH (ref 70–99)
Glucose-Capillary: 382 mg/dL — ABNORMAL HIGH (ref 70–99)
Glucose-Capillary: 435 mg/dL — ABNORMAL HIGH (ref 70–99)
Glucose-Capillary: 453 mg/dL — ABNORMAL HIGH (ref 70–99)
Glucose-Capillary: 479 mg/dL — ABNORMAL HIGH (ref 70–99)
Glucose-Capillary: 494 mg/dL — ABNORMAL HIGH (ref 70–99)

## 2019-06-04 LAB — FERRITIN: Ferritin: 257 ng/mL (ref 24–336)

## 2019-06-04 LAB — D-DIMER, QUANTITATIVE: D-Dimer, Quant: 0.45 ug/mL-FEU (ref 0.00–0.50)

## 2019-06-04 LAB — C-REACTIVE PROTEIN: CRP: 2.1 mg/dL — ABNORMAL HIGH (ref <1.0)

## 2019-06-04 MED ORDER — DEXTROSE 50 % IV SOLN: 0.0000 mL | INTRAVENOUS | Status: DC | PRN

## 2019-06-04 MED ORDER — SODIUM CHLORIDE 0.9 % IV SOLN: INTRAVENOUS | Status: DC

## 2019-06-04 MED ORDER — INSULIN REGULAR(HUMAN) IN NACL 100-0.9 UT/100ML-% IV SOLN
INTRAVENOUS | Status: DC
  Administered 2019-06-04: 11 [IU]/h via INTRAVENOUS
  Administered 2019-06-05: 4.2 [IU]/h via INTRAVENOUS
  Filled 2019-06-04 (×2): qty 100

## 2019-06-04 MED ORDER — DEXTROSE-NACL 5-0.45 % IV SOLN: INTRAVENOUS | Status: DC

## 2019-06-04 NOTE — Progress Notes (Signed)
PROGRESS NOTE  HAVISH PETTIES GUR:[id number] DOB: 11-19-1945 DOA: 05/30/2019 PCP: Jeoffrey Massed, MD  HPI/Recap of past 50 hours: 73 year old male with past medical history of CAD, chronic systolic heart failure status post ICD and pacemaker, atrial fibrillation, stage III CKD, dementia, diabetes mellitus and bioprosthetic mitral valve replacement brought into the emergency room on the early morning of 12/22 with worsening shortness of breath.  Patient had previously been in the emergency room 2 days prior, diagnosed with Covid pneumonia and sent home given that his oxygen was saturation was stable at that time.  This time he was found to be hypoxic and requiring initially a nonrebreather.  Patient was started on IV Decadron and Remdisivir.  Admitted to the hospitalist service.  He was also noted to be confused, but CT scan of the head noted no evidence of any acute findings.  His CRP initially was at 6.1.  Patient able to wean off of oxygen on 12/25.  For the past few days, patient has had elevated CBGs due to steroids and also poorly controlled diabetes mellitus with CBGs ranging in the 400's-500s.  Underlying A1c elevated at 11 already before his steroids.  Continuing to increase his Lantus in response.  However, CBGs remained persistently high.  Overnight, BMET noted anion gap of 20, but repeat BMET noted gap of 13, as has been with most of his lab work.  Given resistantly hypoglycemia, have started insulin drip and made patient n.p.o.  Patient himself with no complaints.  Assessment/Plan: Principal Problem:   Acute respiratory failure with hypoxia due to COVID-19 [facility] & acute on chronic diastolic heart failure plus history of COPD: Weaned off oxygen as of 12/25.  Continue steroids, and finished remdesivir on 12/26.  CRP initially increased to 7.0.  Have discussed with daughter and patient received Actemra on 12/23.  CRP continues to trend downward, today at 2.1.  We will further wean down  steroids tomorrow once CRP closer to normal.  Active Problems:   Right atrial thrombus (HCC)/chronic atrial fibrillation: Continue Eliquis.  Rate controlled.    PVD (peripheral vascular disease) (HCC)     Mixed Ischemic and Nonischemic Cardiomyopathy   Stroke (HCC)    Dementia (HCC) w/acute behavioral disturbance: Likely from hypoxia.  Appears to be back at his baseline.    CAD (coronary artery disease)    Chronic atrial fibrillation (HCC)  AKI in the setting of CKD (chronic kidney disease) stage 3, GFR 30-59 ml/min: Improved, now back to baseline  Uncontrolled diabetes mellitus type 2 on insulin with stage III chronic kidney disease: CBG still quite high.  Tried increasing basal insulin repeatedly, tried changing his sliding scale to resistant every 4 hours instead of with meals.  Now his CRP is closer to normalizing, have started reducing his steroids.  Given limited success, will now make n.p.o. and start insulin drip.  Would favor keeping this since we have discontinued his steroids altogether.  His A1c is 11.2.  I have had a discussion with his daughter and patient will likely need to go home on insulin which will be determined.  In addition, patient's daughter requests some education about best foods to cook for him.  Chronic systolic heart failure: Continue IV Lasix.  Continues to still have good output on a daily basis, down 2-1/2 L.  Will likely change back to p.o. close to discharge.  Unclear why weight dropped significantly in the last 24 hours, may be error.  Code Status: Full code   Family  Communication: Updated daughter by phone  Disposition Plan: Anticipate discharge 12/29   Consultants:  None   Procedures:  none   Antimicrobials:  IV Remdisivir 12/22-12/26  Actemra X1 dose 12/23   DVT prophylaxis: Eliquis   Objective: Vitals:   06/04/19 1138 06/04/19 1545  BP: 110/70 (!) 114/58  Pulse: 80 60  Resp: (!) 24 (!) 26  Temp: (!) 97.4 F (36.3 C) 97.7 F  (36.5 C)  SpO2: 94% 94%   No intake or output data in the 24 hours ending 06/04/19 1551 Filed Weights   06/01/19 0500 06/03/19 0500 06/04/19 0500  Weight: 60.3 kg 59.1 kg 51.3 kg   Body mass index is 19.41 kg/m.  Exam:   General: Alert and oriented x2, no acute distress  HEENT: Normocephalic and atraumatic, mucous membranes are slightly dry  Neck: Supple, no JVD  Cardiovascular: Irregular rhythm, rate controlled  Respiratory: Clear to auscultation bilaterally  Abdomen: Soft, nontender, nondistended, positive bowel sounds  Musculoskeletal: No clubbing or cyanosis, trace pitting edema  Skin: No skin breaks, tears or lesions  Psychiatry: Appropriate, no evidence of acute psychoses   Data Reviewed: CBC: Recent Labs  Lab 05/30/19 0147 05/30/19 0240 06/03/19 0059  WBC 9.0  --  18.2*  NEUTROABS 6.0  --   --   HGB 10.8* 11.9* 12.9*  HCT 35.4* 35.0* 41.0  MCV 91.9  --  88.4  PLT 183  --  176   Basic Metabolic Panel: Recent Labs  Lab 06/01/19 1835 06/02/19 0140 06/03/19 0059 06/03/19 1821 06/04/19 0123 06/04/19 0940  NA 134* 136 138  --  134* 135  K 5.4* 4.7 4.7  --  4.5 5.6*  CL 89* 92* 92*  --  86* 92*  CO2 31 30 33*  --  28 30  GLUCOSE 657* 412* 388* 737* 473* 201*  BUN 80* 83* 91*  --  89* 85*  CREATININE 1.81* 1.67* 1.49*  --  1.38* 1.42*  CALCIUM 9.7 9.5 10.0  --  8.9 9.4   GFR: Estimated Creatinine Clearance: 34.6 mL/min (A) (by C-G formula based on SCr of 1.42 mg/dL (H)). Liver Function Tests: Recent Labs  Lab 05/31/19 0420 06/01/19 0040 06/02/19 0140 06/03/19 0059 06/04/19 0123  AST 26 19 19 19 19   ALT 17 18 16 17 17   ALKPHOS 74 76 78 86 72  BILITOT 0.3 0.4 0.6 0.6 0.7  PROT 7.5 7.2 7.9 8.2* 7.8  ALBUMIN 3.0* 3.0* 3.4* 3.5 3.3*   No results for input(s): LIPASE, AMYLASE in the last 168 hours. No results for input(s): AMMONIA in the last 168 hours. Coagulation Profile: No results for input(s): INR, PROTIME in the last 168  hours. Cardiac Enzymes: No results for input(s): CKTOTAL, CKMB, CKMBINDEX, TROPONINI in the last 168 hours. BNP (last 3 results) No results for input(s): PROBNP in the last 8760 hours. HbA1C: No results for input(s): HGBA1C in the last 72 hours. CBG: Recent Labs  Lab 06/03/19 2238 06/04/19 0332 06/04/19 0750 06/04/19 1109 06/04/19 1538  GLUCAP 525* 296* 130* 230* 435*   Lipid Profile: No results for input(s): CHOL, HDL, LDLCALC, TRIG, CHOLHDL, LDLDIRECT in the last 72 hours. Thyroid Function Tests: No results for input(s): TSH, T4TOTAL, FREET4, T3FREE, THYROIDAB in the last 72 hours. Anemia Panel: Recent Labs    06/03/19 0059 06/04/19 0123  FERRITIN 267 257   Urine analysis:    Component Value Date/Time   COLORURINE STRAW (A) 05/26/2019 1639   APPEARANCEUR CLEAR 05/26/2019 1639   LABSPEC 1.011  05/26/2019 1639   PHURINE 6.0 05/26/2019 1639   GLUCOSEU >=500 (A) 05/26/2019 1639   HGBUR SMALL (A) 05/26/2019 1639   BILIRUBINUR NEGATIVE 05/26/2019 1639   KETONESUR NEGATIVE 05/26/2019 1639   PROTEINUR NEGATIVE 05/26/2019 1639   UROBILINOGEN 0.2 12/24/2014 0311   NITRITE NEGATIVE 05/26/2019 1639   LEUKOCYTESUR NEGATIVE 05/26/2019 1639   Sepsis Labs: @LABRCNTIP (procalcitonin:4,lacticidven:4)  ) Recent Results (from the past 240 hour(s))  Blood Culture (routine x 2)     Status: None   Collection Time: 05/30/19  2:00 AM   Specimen: BLOOD RIGHT HAND  Result Value Ref Range Status   Specimen Description BLOOD RIGHT HAND  Final   Special Requests   Final    BOTTLES DRAWN AEROBIC AND ANAEROBIC Blood Culture adequate volume   Culture   Final    NO GROWTH 5 DAYS Performed at [facility], [address], [place], [place] [address]    Report Status 06/04/2019 FINAL  Final  Blood Culture (routine x 2)     Status: None   Collection Time: 05/30/19  2:00 AM   Specimen: BLOOD  Result Value Ref Range Status   Specimen Description BLOOD RIGHT ANTECUBITAL  Final    Special Requests   Final    BOTTLES DRAWN AEROBIC AND ANAEROBIC Blood Culture results may not be optimal due to an excessive volume of blood received in culture bottles   Culture   Final    NO GROWTH 5 DAYS Performed at [facility], [address], [place], [place] [address]    Report Status 06/04/2019 FINAL  Final  MRSA PCR Screening     Status: None   Collection Time: 05/31/19  3:41 PM   Specimen: Nasopharyngeal  Result Value Ref Range Status   MRSA by PCR NEGATIVE NEGATIVE Final    Comment:        The GeneXpert MRSA Assay (FDA approved for NASAL specimens only), is one component of a comprehensive MRSA colonization surveillance program. It is not intended to diagnose MRSA infection nor to guide or monitor treatment for MRSA infections. Performed at [facility], [address] [place], [place] [address]       Studies: No results found.  Scheduled Meds: . apixaban  5 mg Oral BID  . atorvastatin  40 mg Oral QPM  . carvedilol  6.25 mg Oral BID WC  . dexamethasone (DECADRON) injection  4 mg Intravenous Daily  . digoxin  0.125 mg Oral Daily  . furosemide  60 mg Intravenous BID  . lactulose  30 g Oral Daily  . multivitamin with minerals  1 tablet Oral Daily  . thiamine  100 mg Oral QHS    Continuous Infusions: . sodium chloride    . dextrose 5 % and 0.45% NaCl    . insulin       LOS: 5 days     Hollice Espy, MD [name]  To reach me or the doctor on call, go to: [email or web address] Password [facility]  06/04/2019, 3:51 PM

## 2019-06-04 NOTE — Plan of Care (Signed)
  Problem: Education: Goal: Ability to describe self-care measures that may prevent or decrease complications (Diabetes Survival Skills Education) will improve Outcome: Not Progressing   Problem: Education: Goal: Ability to describe self-care measures that may prevent or decrease complications (Diabetes Survival Skills Education) will improve Outcome: Not Progressing   Problem: Coping: Goal: Ability to adjust to condition or change in health will improve Outcome: Not Progressing   Problem: Fluid Volume: Goal: Ability to maintain a balanced intake and output will improve Outcome: Not Progressing   Problem: Health Behavior/Discharge Planning: Goal: Ability to identify and utilize available resources and services will improve Outcome: Not Progressing   Problem: Metabolic: Goal: Ability to maintain appropriate glucose levels will improve Outcome: Not Progressing

## 2019-06-05 LAB — GLUCOSE, CAPILLARY
Glucose-Capillary: 132 mg/dL — ABNORMAL HIGH (ref 70–99)
Glucose-Capillary: 142 mg/dL — ABNORMAL HIGH (ref 70–99)
Glucose-Capillary: 145 mg/dL — ABNORMAL HIGH (ref 70–99)
Glucose-Capillary: 150 mg/dL — ABNORMAL HIGH (ref 70–99)
Glucose-Capillary: 152 mg/dL — ABNORMAL HIGH (ref 70–99)
Glucose-Capillary: 153 mg/dL — ABNORMAL HIGH (ref 70–99)
Glucose-Capillary: 160 mg/dL — ABNORMAL HIGH (ref 70–99)
Glucose-Capillary: 181 mg/dL — ABNORMAL HIGH (ref 70–99)
Glucose-Capillary: 357 mg/dL — ABNORMAL HIGH (ref 70–99)
Glucose-Capillary: 371 mg/dL — ABNORMAL HIGH (ref 70–99)
Glucose-Capillary: 419 mg/dL — ABNORMAL HIGH (ref 70–99)
Glucose-Capillary: 462 mg/dL — ABNORMAL HIGH (ref 70–99)
Glucose-Capillary: 522 mg/dL (ref 70–99)
Glucose-Capillary: 555 mg/dL (ref 70–99)
Glucose-Capillary: >600 mg/dL (ref 70–99)
Glucose-Capillary: >600 mg/dL (ref 70–99)
Glucose-Capillary: >600 mg/dL (ref 70–99)
Glucose-Capillary: >600 mg/dL (ref 70–99)
Glucose-Capillary: >600 mg/dL (ref 70–99)

## 2019-06-05 LAB — BASIC METABOLIC PANEL
Anion gap: 11 (ref 5–15)
BUN: 86 mg/dL — ABNORMAL HIGH (ref 8–23)
CO2: 30 mmol/L (ref 22–32)
Calcium: 9.1 mg/dL (ref 8.9–10.3)
Chloride: 95 mmol/L — ABNORMAL LOW (ref 98–111)
Creatinine, Ser: 1.39 mg/dL — ABNORMAL HIGH (ref 0.61–1.24)
GFR calc Af Amer: 59 mL/min — ABNORMAL LOW (ref >60)
GFR calc non Af Amer: 51 mL/min — ABNORMAL LOW (ref >60)
Glucose, Bld: 173 mg/dL — ABNORMAL HIGH (ref 70–99)
Potassium: 4.7 mmol/L (ref 3.5–5.1)
Sodium: 136 mmol/L (ref 135–145)

## 2019-06-05 LAB — GLUCOSE, RANDOM
Glucose, Bld: 527 mg/dL (ref 70–99)
Glucose, Bld: 430 mg/dL — ABNORMAL HIGH (ref 70–99)

## 2019-06-05 MED ORDER — INSULIN ASPART 100 UNIT/ML ~~LOC~~ SOLN
20.0000 [IU] | Freq: Once | SUBCUTANEOUS | Status: AC
  Administered 2019-06-05: 20 [IU] via SUBCUTANEOUS

## 2019-06-05 MED ORDER — TORSEMIDE 20 MG PO TABS
80.0000 mg | ORAL_TABLET | Freq: Every day | ORAL | Status: DC
  Administered 2019-06-05 – 2019-06-14 (×10): 80 mg via ORAL
  Filled 2019-06-05 (×10): qty 4

## 2019-06-05 MED ORDER — INSULIN DETEMIR 100 UNIT/ML ~~LOC~~ SOLN
0.3000 [IU]/kg | SUBCUTANEOUS | Status: DC
  Administered 2019-06-05: 15 [IU] via SUBCUTANEOUS
  Filled 2019-06-05: qty 0.15

## 2019-06-05 MED ORDER — INSULIN ASPART 100 UNIT/ML ~~LOC~~ SOLN
0.0000 [IU] | Freq: Three times a day (TID) | SUBCUTANEOUS | Status: DC
  Administered 2019-06-05: 20 [IU] via SUBCUTANEOUS
  Administered 2019-06-05: 3 [IU] via SUBCUTANEOUS
  Administered 2019-06-05 – 2019-06-06 (×2): 20 [IU] via SUBCUTANEOUS
  Administered 2019-06-06: 3 [IU] via SUBCUTANEOUS
  Administered 2019-06-06: 11 [IU] via SUBCUTANEOUS
  Administered 2019-06-07: 4 [IU] via SUBCUTANEOUS
  Administered 2019-06-07: 09:00:00 3 [IU] via SUBCUTANEOUS
  Administered 2019-06-07 – 2019-06-08 (×3): 11 [IU] via SUBCUTANEOUS
  Administered 2019-06-09: 15 [IU] via SUBCUTANEOUS
  Administered 2019-06-09 – 2019-06-10 (×2): 4 [IU] via SUBCUTANEOUS
  Administered 2019-06-10: 3 [IU] via SUBCUTANEOUS
  Administered 2019-06-10: 11 [IU] via SUBCUTANEOUS
  Administered 2019-06-11: 17:00:00 7 [IU] via SUBCUTANEOUS
  Administered 2019-06-11: 15 [IU] via SUBCUTANEOUS
  Administered 2019-06-12: 4 [IU] via SUBCUTANEOUS
  Administered 2019-06-12: 11 [IU] via SUBCUTANEOUS
  Administered 2019-06-12: 7 [IU] via SUBCUTANEOUS
  Administered 2019-06-13: 3 [IU] via SUBCUTANEOUS
  Administered 2019-06-13: 7 [IU] via SUBCUTANEOUS
  Administered 2019-06-13 – 2019-06-14 (×2): 4 [IU] via SUBCUTANEOUS
  Administered 2019-06-14: 11 [IU] via SUBCUTANEOUS

## 2019-06-05 MED ORDER — INSULIN DETEMIR 100 UNIT/ML ~~LOC~~ SOLN
0.3000 [IU]/kg | Freq: Two times a day (BID) | SUBCUTANEOUS | Status: DC
  Administered 2019-06-05 – 2019-06-06 (×2): 15 [IU] via SUBCUTANEOUS
  Filled 2019-06-05 (×3): qty 0.15

## 2019-06-05 MED ORDER — INSULIN ASPART 100 UNIT/ML ~~LOC~~ SOLN
0.0000 [IU] | Freq: Every day | SUBCUTANEOUS | Status: DC
  Administered 2019-06-05: 5 [IU] via SUBCUTANEOUS
  Administered 2019-06-06: 4 [IU] via SUBCUTANEOUS
  Administered 2019-06-07 – 2019-06-09 (×3): 3 [IU] via SUBCUTANEOUS
  Administered 2019-06-12: 22:00:00 2 [IU] via SUBCUTANEOUS
  Administered 2019-06-13: 3 [IU] via SUBCUTANEOUS

## 2019-06-05 NOTE — Progress Notes (Signed)
Dr. Thereasa Solo notified about CBG being 522. He will order some more Insulin.

## 2019-06-05 NOTE — Progress Notes (Addendum)
Called [name] to update her on her father but there was no answer. Voicemail left.   [name] called back and I updated her.

## 2019-06-05 NOTE — Plan of Care (Signed)
  Problem: Coping: Goal: Ability to adjust to condition or change in health will improve Outcome: Progressing   Problem: Fluid Volume: Goal: Ability to maintain a balanced intake and output will improve Outcome: Progressing   Problem: Education: Goal: Ability to describe self-care measures that may prevent or decrease complications (Diabetes Survival Skills Education) will improve Outcome: Not Progressing   Problem: Education: Goal: Ability to describe self-care measures that may prevent or decrease complications (Diabetes Survival Skills Education) will improve Outcome: Not Progressing

## 2019-06-05 NOTE — Plan of Care (Signed)
  Problem: Education: Goal: Ability to describe self-care measures that may prevent or decrease complications (Diabetes Survival Skills Education) will improve Outcome: Progressing   Problem: Coping: Goal: Ability to adjust to condition or change in health will improve Outcome: Progressing   Problem: Fluid Volume: Goal: Ability to maintain a balanced intake and output will improve Outcome: Progressing   Problem: Cardiac: Goal: Ability to achieve and maintain adequate cardiopulmonary perfusion will improve Outcome: Progressing   Problem: Education: Goal: Ability to describe self-care measures that may prevent or decrease complications (Diabetes Survival Skills Education) will improve Outcome: Progressing   Problem: Coping: Goal: Ability to adjust to condition or change in health will improve Outcome: Progressing   Problem: Fluid Volume: Goal: Ability to maintain a balanced intake and output will improve Outcome: Progressing   Problem: Health Behavior/Discharge Planning: Goal: Ability to identify and utilize available resources and services will improve Outcome: Progressing Goal: Ability to manage health-related needs will improve Outcome: Progressing   Problem: Metabolic: Goal: Ability to maintain appropriate glucose levels will improve Outcome: Progressing   Problem: Nutritional: Goal: Maintenance of adequate nutrition will improve Outcome: Progressing Goal: Progress toward achieving an optimal weight will improve Outcome: Progressing   Problem: Skin Integrity: Goal: Risk for impaired skin integrity will decrease Outcome: Progressing   Problem: Tissue Perfusion: Goal: Adequacy of tissue perfusion will improve Outcome: Progressing   Problem: Education: Goal: Knowledge of risk factors and measures for prevention of condition will improve Outcome: Progressing   Problem: Coping: Goal: Psychosocial and spiritual needs will be supported Outcome: Progressing    Problem: Respiratory: Goal: Will maintain a patent airway Outcome: Progressing Goal: Complications related to the disease process, condition or treatment will be avoided or minimized Outcome: Progressing   Problem: Education: Goal: Ability to demonstrate management of disease process will improve Outcome: Progressing Goal: Ability to verbalize understanding of medication therapies will improve Outcome: Progressing   Problem: Activity: Goal: Capacity to carry out activities will improve Outcome: Progressing   Problem: Cardiac: Goal: Ability to achieve and maintain adequate cardiopulmonary perfusion will improve Outcome: Progressing   

## 2019-06-05 NOTE — Progress Notes (Signed)
[name]  PNT:[id number] DOB: 27-May-1946 DOA: 05/30/2019 PCP: Jeoffrey Massed, MD    Brief Narrative:  73 year old with a history of CAD, chronic systolic CHF, pacemaker/ICD, atrial fibrillation, CKD stage III, DM, dementia, and bioprosthetic mitral valve replacement who presented to the ED 12/22 with SOB.  The patient was seen in the emergency room 12/20 with the same complaints, diagnosed with Covid pneumonia, and was able to be sent home.  On his return visit he was much more hypoxic and required a nonrebreather.  CT of the head was without acute findings.  Significant Events: 12/20 ED evaluation -COVID Pneumonia 12/22 admit via [place]  COVID-19 specific Treatment: Remdesivir 12/22 > 12/26 Actemra 12/23 Decadron 12/22 >  Subjective: Presently on room air. Insulin gtt stopped early this morning and CBG now rebounding and severely uncontrolled again.  Patient is confused and cannot provide a reliable history but appears comfortable and in no respiratory distress.  Assessment & Plan:  COVID Pneumonia -acute hypoxic respiratory failure Has completed the course of remdesivir -given stability of respiratory status will discontinue steroid because of severely uncontrolled diabetes -saturations stable on room air at present  Uncontrolled DM2 with severe refractory hyperglycemia Steroids discontinued -adjust insulin therapy again -may require return to IV insulin if trend continues  Chronic systolic CHF Continue diuretic -no gross volume overload on physical exam -net negative approximately 1 L since admission Filed Weights   06/01/19 0500 06/03/19 0500 06/04/19 0500  Weight: 60.3 kg 59.1 kg 51.3 kg    Chronic atrial fibrillation with right atrial thrombus Continue home Eliquis -rate controlled  PVD  Dementia with acute delirium  CAD  AKI on CKD stage III Creatinine slowly improving  DVT prophylaxis: Apixaban Code Status: FULL CODE Family Communication:    Disposition Plan: Telemetry bed  Consultants:  none  Antimicrobials:  None  Objective: Blood pressure 133/84, pulse (!) 59, temperature (!) 96.8 F (36 C), temperature source Axillary, resp. rate 15, height 5\' 4"  (1.626 m), weight 51.3 kg, SpO2 93 %.  Intake/Output Summary (Last 24 hours) at 06/05/2019 0828 Last data filed at 06/05/2019 0547 Gross per 24 hour  Intake 984.26 ml  Output --  Net 984.26 ml   Filed Weights   06/01/19 0500 06/03/19 0500 06/04/19 0500  Weight: 60.3 kg 59.1 kg 51.3 kg    Examination: General: No acute respiratory distress Lungs: Fine bibasilar crackles with no wheezing Cardiovascular: Irregularly irregular without murmur or rub Abdomen: Nontender, nondistended, soft, bowel sounds positive, no rebound, no ascites, no appreciable mass Extremities: No significant cyanosis, clubbing, or edema bilateral lower extremities  CBC: Recent Labs  Lab 05/30/19 0147 05/30/19 0240 06/03/19 0059  WBC 9.0  --  18.2*  NEUTROABS 6.0  --   --   HGB 10.8* 11.9* 12.9*  HCT 35.4* 35.0* 41.0  MCV 91.9  --  88.4  PLT 183  --  275   Basic Metabolic Panel: Recent Labs  Lab 06/04/19 0123 06/04/19 0940 06/05/19 0255  NA 134* 135 136  K 4.5 5.6* 4.7  CL 86* 92* 95*  CO2 28 30 30   GLUCOSE 473* 201* 173*  BUN 89* 85* 86*  CREATININE 1.38* 1.42* 1.39*  CALCIUM 8.9 9.4 9.1   GFR: Estimated Creatinine Clearance: 35.4 mL/min (A) (by C-G formula based on SCr of 1.39 mg/dL (H)).  Liver Function Tests: Recent Labs  Lab 06/01/19 0040 06/02/19 0140 06/03/19 0059 06/04/19 0123  AST 19 19 19 19   ALT 18 16 17  17  ALKPHOS 76 78 86 72  BILITOT 0.4 0.6 0.6 0.7  PROT 7.2 7.9 8.2* 7.8  ALBUMIN 3.0* 3.4* 3.5 3.3*    HbA1C: Hemoglobin A1C  Date/Time Value Ref Range Status  12/13/2017 12:00 AM 9.1  Final   Hgb A1c MFr Bld  Date/Time Value Ref Range Status  05/30/2019 10:20 PM 11.1 (H) 4.8 - 5.6 % Final    Comment:    (NOTE) Pre diabetes:           5.7%-6.4% Diabetes:              >6.4% Glycemic control for   <7.0% adults with diabetes   10/25/2018 09:22 AM 10.8 (H) 4.6 - 6.5 % Final    Comment:    Glycemic Control Guidelines for People with Diabetes:Non Diabetic:  <6%Goal of Therapy: <7%Additional Action Suggested:  >8%     CBG: Recent Labs  Lab 06/05/19 0159 06/05/19 0245 06/05/19 0444 06/05/19 0545 06/05/19 0750  GLUCAP 145* 181* 152* 142* 132*    Recent Results (from the past 240 hour(s))  Blood Culture (routine x 2)     Status: None   Collection Time: 05/30/19  2:00 AM   Specimen: BLOOD RIGHT HAND  Result Value Ref Range Status   Specimen Description BLOOD RIGHT HAND  Final   Special Requests   Final    BOTTLES DRAWN AEROBIC AND ANAEROBIC Blood Culture adequate volume   Culture   Final    NO GROWTH 5 DAYS Performed at [facility], [address] [place], [place] [address]    Report Status 06/04/2019 FINAL  Final  Blood Culture (routine x 2)     Status: None   Collection Time: 05/30/19  2:00 AM   Specimen: BLOOD  Result Value Ref Range Status   Specimen Description BLOOD RIGHT ANTECUBITAL  Final   Special Requests   Final    BOTTLES DRAWN AEROBIC AND ANAEROBIC Blood Culture results may not be optimal due to an excessive volume of blood received in culture bottles   Culture   Final    NO GROWTH 5 DAYS Performed at [facility], [address] [place], [place] [address]    Report Status 06/04/2019 FINAL  Final  MRSA PCR Screening     Status: None   Collection Time: 05/31/19  3:41 PM   Specimen: Nasopharyngeal  Result Value Ref Range Status   MRSA by PCR NEGATIVE NEGATIVE Final    Comment:        The GeneXpert MRSA Assay (FDA approved for NASAL specimens only), is one component of a comprehensive MRSA colonization surveillance program. It is not intended to diagnose MRSA infection nor to guide or monitor treatment for MRSA infections. Performed at [facility], [address], [place], [place] [address]      Scheduled Meds: . apixaban  5 mg Oral BID  . atorvastatin  40 mg Oral QPM  . carvedilol  6.25 mg Oral BID WC  . dexamethasone (DECADRON) injection  4 mg Intravenous Daily  . digoxin  0.125 mg Oral Daily  . furosemide  60 mg Intravenous BID  . insulin aspart  0-20 Units Subcutaneous TID WC  . insulin aspart  0-5 Units Subcutaneous QHS  . insulin detemir  0.3 Units/kg Subcutaneous Q24H  . lactulose  30 g Oral Daily  . multivitamin with minerals  1 tablet Oral Daily  . thiamine  100 mg Oral QHS     LOS: 6 days  Cherene Altes, MD [name] Office  [phone] Pager - Text Page per Amion  If 7PM-7AM, please contact night-coverage per Amion 06/05/2019, 8:28 AM

## 2019-06-05 NOTE — Plan of Care (Signed)
Sent message to Dr vera control criteria has been met with insulin drip. Awaiting additional orders.

## 2019-06-05 NOTE — Plan of Care (Signed)
Insulin drip stopped per orders. Stop 2 hours after levmir, levmir was given at 0346. Current blood glucose 142.

## 2019-06-06 LAB — COMPREHENSIVE METABOLIC PANEL
ALT: 21 U/L (ref 0–44)
AST: 22 U/L (ref 15–41)
Albumin: 3.3 g/dL — ABNORMAL LOW (ref 3.5–5.0)
Alkaline Phosphatase: 66 U/L (ref 38–126)
Anion gap: 15 (ref 5–15)
BUN: 95 mg/dL — ABNORMAL HIGH (ref 8–23)
CO2: 29 mmol/L (ref 22–32)
Calcium: 8.7 mg/dL — ABNORMAL LOW (ref 8.9–10.3)
Chloride: 92 mmol/L — ABNORMAL LOW (ref 98–111)
Creatinine, Ser: 1.42 mg/dL — ABNORMAL HIGH (ref 0.61–1.24)
GFR calc Af Amer: 57 mL/min — ABNORMAL LOW (ref >60)
GFR calc non Af Amer: 49 mL/min — ABNORMAL LOW (ref >60)
Glucose, Bld: 133 mg/dL — ABNORMAL HIGH (ref 70–99)
Potassium: 4.4 mmol/L (ref 3.5–5.1)
Sodium: 136 mmol/L (ref 135–145)
Total Bilirubin: 0.7 mg/dL (ref 0.3–1.2)
Total Protein: 7.2 g/dL (ref 6.5–8.1)

## 2019-06-06 LAB — GLUCOSE, CAPILLARY
Glucose-Capillary: 138 mg/dL — ABNORMAL HIGH (ref 70–99)
Glucose-Capillary: 264 mg/dL — ABNORMAL HIGH (ref 70–99)
Glucose-Capillary: 381 mg/dL — ABNORMAL HIGH (ref 70–99)
Glucose-Capillary: 432 mg/dL — ABNORMAL HIGH (ref 70–99)
Glucose-Capillary: 402 mg/dL — ABNORMAL HIGH (ref 70–99)
Glucose-Capillary: 327 mg/dL — ABNORMAL HIGH (ref 70–99)

## 2019-06-06 MED ORDER — ENSURE ENLIVE PO LIQD
237.0000 mL | Freq: Two times a day (BID) | ORAL | Status: DC
  Administered 2019-06-06 – 2019-06-11 (×9): 237 mL via ORAL

## 2019-06-06 MED ORDER — INSULIN DETEMIR 100 UNIT/ML ~~LOC~~ SOLN
18.0000 [IU] | Freq: Two times a day (BID) | SUBCUTANEOUS | Status: DC
  Administered 2019-06-06 – 2019-06-07 (×2): 18 [IU] via SUBCUTANEOUS
  Filled 2019-06-06 (×3): qty 0.18

## 2019-06-06 NOTE — Progress Notes (Signed)
Occupational Therapy Evaluation Patient Details Name: [name] MRN: [id number] DOB: 01/06/1946 Today's Date: 06/06/2019    History of Present Illness 73 year old with a history of CAD, chronic systolic CHF, pacemaker/ICD, atrial fibrillation, CKD stage III, DM, dementia, and bioprosthetic mitral valve replacement who presented to the ED 12/22 with SOB.  The patient was seen in the emergency room 12/20 with the same complaints, diagnosed with Covid pneumonia, and was able to be sent home.  On his return visit he was much more hypoxic and required a nonrebreather.  CT of the head was without acute findings.   Clinical Impression   PTA pt was living with his daughter, independent in dressing, toileting, and feeding tasks. Per daughter pt requires assist and cues to shower and complete grooming/hygiene tasks. Daughter states that pt ambulates independently without an assistive device. Daughter also states that over the past year pt has been more confused and sleeping more during the day. Pt currently requires setup to min assist for self-care and functional transfer tasks. Pt able to ambulate to/from bathroom with min guard, noting 0 instances of loss of balance. Pt completed toileting task, able to stand at the sink 1 x 5 min to complete grooming/hyiene tasks. Pt required mod to max cues to initiate tasks. Pt's SpO2 maintained in 90s throughout on room air. No signs/symptoms of distress. Pt demonstrates decreased cognition, strength, endurance, balance, standing tolerance, activity tolerance, and safety awareness impacting ability to complete self-care and functional transfer tasks safely and independently. Recommend skilled OT services to address above deficits in order to promote function and prevent further decline. Recommend SNF placement for additional rehab.     Follow Up Recommendations  SNF    Equipment Recommendations  Other (comment)(TBD at next venue of care)    Recommendations for  Other Services       Precautions / Restrictions Precautions Precautions: Fall Restrictions Weight Bearing Restrictions: No      Mobility Bed Mobility Overal bed mobility: Needs Assistance Bed Mobility: Supine to Sit     Supine to sit: Supervision;HOB elevated        Transfers Overall transfer level: Needs assistance Equipment used: None Transfers: Sit to/from Stand Sit to Stand: Min guard              Balance Overall balance assessment: Mild deficits observed, not formally tested                                         ADL either performed or assessed with clinical judgement   ADL Overall ADL's : Needs assistance/impaired Eating/Feeding: Set up;Supervision/ safety;Sitting   Grooming: Min guard;Standing   Upper Body Bathing: Minimal assistance;Sitting   Lower Body Bathing: Minimal assistance;Sit to/from stand   Upper Body Dressing : Minimal assistance;Sitting   Lower Body Dressing: Minimal assistance;Sit to/from stand   Toilet Transfer: Min guard;Ambulation;Regular Toilet;Grab bars   Toileting- Clothing Manipulation and Hygiene: Min guard;Sit to/from stand       Functional mobility during ADLs: Min guard General ADL Comments: Pt able to ambulate to/from bathroom with min guard and without an assistive device.     Vision Baseline Vision/History: Wears glasses Wears Glasses: At all times       Perception     Praxis      Pertinent Vitals/Pain Pain Assessment: No/denies pain     Hand Dominance Left   Extremity/Trunk Assessment Upper Extremity Assessment  Upper Extremity Assessment: Generalized weakness   Lower Extremity Assessment Lower Extremity Assessment: Defer to PT evaluation       Communication Communication Communication: HOH   Cognition Arousal/Alertness: Awake/alert Behavior During Therapy: Flat affect Overall Cognitive Status: History of cognitive impairments - at baseline                                      General Comments       Exercises     Shoulder Instructions      Home Living Family/patient expects to be discharged to:: Private residence Living Arrangements: Children Available Help at Discharge: Family Type of Home: House Home Access: Stairs to enter;Ramped entrance     Home Layout: One level     Bathroom Shower/Tub: Producer, television/film/video: Standard     Home Equipment: None   Additional Comments: PLOF obtained from pts daughter. Daughter unable to provide 24/7 assist at home. Daughter is caring for her 92 y.o. grandson.      Prior Functioning/Environment Level of Independence: Needs assistance  Gait / Transfers Assistance Needed: Pt ambulates independently without an assistive device ADL's / Homemaking Assistance Needed: Per daughter, pt is independent in dressing, toileting, and feeding tasks. Daughter states that pt requies assist to shower and and cues to brush his dentures. Daughter is concerned as pt's mentation has been getting worse over the last year with pt sleeping more during the day.            OT Problem List: Decreased strength;Decreased activity tolerance;Impaired balance (sitting and/or standing);Decreased safety awareness;Decreased knowledge of use of DME or AE;Cardiopulmonary status limiting activity      OT Treatment/Interventions: Self-care/ADL training;Therapeutic exercise;Neuromuscular education;Energy conservation;DME and/or AE instruction;Therapeutic activities;Patient/family education;Balance training    OT Goals(Current goals can be found in the care plan section) Acute Rehab OT Goals Patient Stated Goal: Pt agreeable to sitting up in chair Time For Goal Achievement: 06/20/19 Potential to Achieve Goals: Good ADL Goals Pt Will Perform Grooming: with set-up;standing Pt Will Perform Lower Body Dressing: with set-up;sit to/from stand Pt Will Transfer to Toilet: with set-up;ambulating;regular height toilet Pt  Will Perform Toileting - Clothing Manipulation and hygiene: with set-up;sit to/from stand Additional ADL Goal #1: Pt to tolerate standing up to 10 min with supervision, in preparation for ADLs.  OT Frequency: Min 2X/week   Barriers to D/C:            Co-evaluation              AM-PAC OT "6 Clicks" Daily Activity     Outcome Measure Help from another person eating meals?: A Little Help from another person taking care of personal grooming?: A Little Help from another person toileting, which includes using toliet, bedpan, or urinal?: A Little Help from another person bathing (including washing, rinsing, drying)?: A Little Help from another person to put on and taking off regular upper body clothing?: A Little Help from another person to put on and taking off regular lower body clothing?: A Little 6 Click Score: 18   End of Session Equipment Utilized During Treatment: Gait belt Nurse Communication: Mobility status  Activity Tolerance: Patient tolerated treatment well Patient left: in chair;with call bell/phone within reach;with chair alarm set;with nursing/sitter in room  OT Visit Diagnosis: Unsteadiness on feet (R26.81);Muscle weakness (generalized) (M62.81)  Time: 7517-0017 OT Time Calculation (min): 26 min Charges:  OT General Charges $OT Visit: 1 Visit OT Evaluation $OT Eval Low Complexity: 1 Low OT Treatments $Self Care/Home Management : 8-22 mins  Mauri Brooklyn OTR/L [phone]   Mauri Brooklyn 06/06/2019, 9:14 AM

## 2019-06-06 NOTE — Progress Notes (Addendum)
Nutrition Follow up  DOCUMENTATION CODES:   Not applicable  INTERVENTION:    Add Ensure Enlive po BID, each supplement provides 350 kcal and 20 grams of protein  Continue MVI daily   NUTRITION DIAGNOSIS:   Increased nutrient needs related to acute illness as evidenced by estimated needs.  Ongoing  GOAL:   Patient will meet greater than or equal to 90% of their needs   Progressing   MONITOR:   PO intake, Supplement acceptance, Weight trends, Labs, I & O's  REASON FOR ASSESSMENT:   Malnutrition Screening Tool    ASSESSMENT:   Patient with PMH significant for COPD, CAD, CHF s/p ICD and pacemaker, CKD III, dementia, DM, and MVR replacement. Presents this admission with acute respiratory failure with hypoxia 2/2 to COVID-19.   RD working remotely.  Unable to reach pt by phone. Last three meal completions charted as 50%, 75%, and 60% (chart lacks documentation from 12/22-12/26). Ensure discontinued by MD likely due to elevated CBGs. Insulin drip and decadron have been discontinued. CBGs trending down today. Recommend adding Ensure as it's the highest kcal/protein supplement offered and pt is not meeting needs with meals alone.   Admission weight: 58.8 kg  Current weight: 51.3 kg   I/O: -3,322 ml since admit UOP: 2,870 ml x 24 hrs   Medications: 80 mg demadex daily, SS novolog, levemir, thiamine, lactulose, MVI with minerals Labs: CBG 138-522 (trending down today)   Diet Order:   Diet Order            Diet heart healthy/carb modified Room service appropriate? Yes; Fluid consistency: Thin  Diet effective now              EDUCATION NEEDS:   Not appropriate for education at this time  Skin:  Skin Assessment: Reviewed RN Assessment  Last BM:  12/28  Height:   Ht Readings from Last 1 Encounters:  05/30/19 5\' 4"  (1.626 m)    Weight:   Wt Readings from Last 1 Encounters:  06/04/19 51.3 kg    Ideal Body Weight:  59.1 kg  BMI:  Body mass index is  19.41 kg/m.  Estimated Nutritional Needs:   Kcal:  2000-2200 kcal  Protein:  100-120 grams  Fluid:  >/= 2 L/day   Mariana Single RD, LDN Clinical Nutrition Pager # - [phone]

## 2019-06-06 NOTE — Plan of Care (Signed)
  Problem: Education: Goal: Ability to describe self-care measures that may prevent or decrease complications (Diabetes Survival Skills Education) will improve Outcome: Progressing   Problem: Coping: Goal: Ability to adjust to condition or change in health will improve Outcome: Progressing   Problem: Fluid Volume: Goal: Ability to maintain a balanced intake and output will improve Outcome: Progressing   Problem: Cardiac: Goal: Ability to achieve and maintain adequate cardiopulmonary perfusion will improve Outcome: Progressing   Problem: Education: Goal: Ability to describe self-care measures that may prevent or decrease complications (Diabetes Survival Skills Education) will improve Outcome: Progressing   Problem: Coping: Goal: Ability to adjust to condition or change in health will improve Outcome: Progressing   Problem: Fluid Volume: Goal: Ability to maintain a balanced intake and output will improve Outcome: Progressing   Problem: Health Behavior/Discharge Planning: Goal: Ability to identify and utilize available resources and services will improve Outcome: Progressing Goal: Ability to manage health-related needs will improve Outcome: Progressing   Problem: Metabolic: Goal: Ability to maintain appropriate glucose levels will improve Outcome: Progressing   Problem: Nutritional: Goal: Maintenance of adequate nutrition will improve Outcome: Progressing Goal: Progress toward achieving an optimal weight will improve Outcome: Progressing   Problem: Skin Integrity: Goal: Risk for impaired skin integrity will decrease Outcome: Progressing   Problem: Tissue Perfusion: Goal: Adequacy of tissue perfusion will improve Outcome: Progressing   Problem: Education: Goal: Knowledge of risk factors and measures for prevention of condition will improve Outcome: Progressing   Problem: Coping: Goal: Psychosocial and spiritual needs will be supported Outcome: Progressing    Problem: Respiratory: Goal: Will maintain a patent airway Outcome: Progressing Goal: Complications related to the disease process, condition or treatment will be avoided or minimized Outcome: Progressing   Problem: Education: Goal: Ability to demonstrate management of disease process will improve Outcome: Progressing Goal: Ability to verbalize understanding of medication therapies will improve Outcome: Progressing   Problem: Activity: Goal: Capacity to carry out activities will improve Outcome: Progressing   Problem: Cardiac: Goal: Ability to achieve and maintain adequate cardiopulmonary perfusion will improve Outcome: Progressing   

## 2019-06-06 NOTE — Progress Notes (Signed)
[name]  YIF:[id number] DOB: 10-10-1945 DOA: 05/30/2019 PCP: Jeoffrey Massed, MD    Brief Narrative:  73 year old with a history of CAD, chronic systolic CHF, pacemaker/ICD, atrial fibrillation, CKD stage III, DM, dementia, and bioprosthetic mitral valve replacement who presented to the ED 12/22 with SOB.  The patient was seen in the emergency room 12/20 with the same complaints, diagnosed with Covid pneumonia, and was able to be sent home.  On his return visit he was much more hypoxic and required a nonrebreather.  CT of the head was without acute findings.  Significant Events: 12/20 ED evaluation -COVID Pneumonia 12/22 admit via [facility]  COVID-19 specific Treatment: Remdesivir 12/22 > 12/26 Actemra 12/23 Decadron 12/22 > 12/28  Subjective: Resting comfortably in a bedside chair.  Is confused but pleasant.  Denies any complaints whatsoever.  No respiratory distress.  Assessment & Plan:  COVID Pneumonia -acute hypoxic respiratory failure Has completed a course of remdesivir -given stability of respiratory status discontinued steroid because of severely uncontrolled diabetes -saturations stable on room air at present  Uncontrolled DM2 with severe refractory hyperglycemia Steroids discontinued -CBGs remain quite variable for now -gently adjust treatment again and continue to follow  Chronic systolic CHF Continue diuretic - no gross volume overload on exam - net negative approximately 3.3 L since admission    Filed Weights   06/01/19 0500 06/03/19 0500 06/04/19 0500  Weight: 60.3 kg 59.1 kg 51.3 kg    Chronic atrial fibrillation with right atrial thrombus Continue home Eliquis - rate controlled  PVD  Dementia with acute delirium  CAD  AKI on CKD stage III crt stable   DVT prophylaxis: Apixaban Code Status: FULL CODE Family Communication:  Disposition Plan: will need SNF rehab before returning home w/ daughter   Consultants:  none  Antimicrobials:   None  Objective: Blood pressure 113/63, pulse 62, temperature 97.9 F (36.6 C), temperature source Oral, resp. rate 16, height 5\' 4"  (1.626 m), weight 51.3 kg, SpO2 95 %.  Intake/Output Summary (Last 24 hours) at 06/06/2019 0952 Last data filed at 06/06/2019 0300 Gross per 24 hour  Intake 720 ml  Output 2870 ml  Net -2150 ml   Filed Weights   06/01/19 0500 06/03/19 0500 06/04/19 0500  Weight: 60.3 kg 59.1 kg 51.3 kg    Examination: General: No acute respiratory distress Lungs: Clear to auscultation without wheezing Cardiovascular: Irregularly irregular  Abdomen: NT/ND, soft, BS+, no mass  Extremities: No significant C/C/E B LE   CBC: Recent Labs  Lab 06/03/19 0059  WBC 18.2*  HGB 12.9*  HCT 41.0  MCV 88.4  PLT 275   Basic Metabolic Panel: Recent Labs  Lab 06/04/19 0940 06/05/19 0255 06/05/19 1620 06/05/19 2100 06/06/19 0440  NA 135 136  --   --  136  K 5.6* 4.7  --   --  4.4  CL 92* 95*  --   --  92*  CO2 30 30  --   --  29  GLUCOSE 201* 173* 527* 430* 133*  BUN 85* 86*  --   --  95*  CREATININE 1.42* 1.39*  --   --  1.42*  CALCIUM 9.4 9.1  --   --  8.7*   GFR: Estimated Creatinine Clearance: 34.6 mL/min (A) (by C-G formula based on SCr of 1.42 mg/dL (H)).  Liver Function Tests: Recent Labs  Lab 06/02/19 0140 06/03/19 0059 06/04/19 0123 06/06/19 0440  AST 19 19 19 22   ALT 16 17 17  21  ALKPHOS 78 86 72 66  BILITOT 0.6 0.6 0.7 0.7  PROT 7.9 8.2* 7.8 7.2  ALBUMIN 3.4* 3.5 3.3* 3.3*    HbA1C: Hemoglobin A1C  Date/Time Value Ref Range Status  12/13/2017 12:00 AM 9.1  Final   Hgb A1c MFr Bld  Date/Time Value Ref Range Status  05/30/2019 10:20 PM 11.1 (H) 4.8 - 5.6 % Final    Comment:    (NOTE) Pre diabetes:          5.7%-6.4% Diabetes:              >6.4% Glycemic control for   <7.0% adults with diabetes   10/25/2018 09:22 AM 10.8 (H) 4.6 - 6.5 % Final    Comment:    Glycemic Control Guidelines for People with Diabetes:Non Diabetic:   <6%Goal of Therapy: <7%Additional Action Suggested:  >8%     CBG: Recent Labs  Lab 06/05/19 1140 06/05/19 1541 06/05/19 1925 06/05/19 2147 06/06/19 0825  GLUCAP 357* 522* 462* 371* 138*    Recent Results (from the past 240 hour(s))  Blood Culture (routine x 2)     Status: None   Collection Time: 05/30/19  2:00 AM   Specimen: BLOOD RIGHT HAND  Result Value Ref Range Status   Specimen Description BLOOD RIGHT HAND  Final   Special Requests   Final    BOTTLES DRAWN AEROBIC AND ANAEROBIC Blood Culture adequate volume   Culture   Final    NO GROWTH 5 DAYS Performed at [facility], [place] [address], [place], [place] [address]    Report Status 06/04/2019 FINAL  Final  Blood Culture (routine x 2)     Status: None   Collection Time: 05/30/19  2:00 AM   Specimen: BLOOD  Result Value Ref Range Status   Specimen Description BLOOD RIGHT ANTECUBITAL  Final   Special Requests   Final    BOTTLES DRAWN AEROBIC AND ANAEROBIC Blood Culture results may not be optimal due to an excessive volume of blood received in culture bottles   Culture   Final    NO GROWTH 5 DAYS Performed at [facility], [place] [address] [place], [place] [address]    Report Status 06/04/2019 FINAL  Final  MRSA PCR Screening     Status: None   Collection Time: 05/31/19  3:41 PM   Specimen: Nasopharyngeal  Result Value Ref Range Status   MRSA by PCR NEGATIVE NEGATIVE Final    Comment:        The GeneXpert MRSA Assay (FDA approved for NASAL specimens only), is one component of a comprehensive MRSA colonization surveillance program. It is not intended to diagnose MRSA infection nor to guide or monitor treatment for MRSA infections. Performed at [facility], [place] [address] [place], [place] [address]      Scheduled Meds: . apixaban  5 mg Oral BID  . atorvastatin  40 mg Oral QPM  . carvedilol  6.25 mg Oral BID WC  . digoxin  0.125 mg Oral Daily  . insulin aspart  0-20 Units  Subcutaneous TID WC  . insulin aspart  0-5 Units Subcutaneous QHS  . insulin detemir  0.3 Units/kg Subcutaneous BID  . lactulose  30 g Oral Daily  . multivitamin with minerals  1 tablet Oral Daily  . thiamine  100 mg Oral QHS  . torsemide  80 mg Oral Daily     LOS: 7 days   Cherene Altes, MD [name]  Office  [phone] Pager - Text Page per Loretha Stapler  If 7PM-7AM, please contact night-coverage per Amion 06/06/2019, 9:52 AM

## 2019-06-06 NOTE — Evaluation (Addendum)
Physical Therapy Evaluation Patient Details Name: [name] MRN: [id number] DOB: 1945/12/13 Today's Date: 06/06/2019   History of Present Illness  73 year old male admitted on 05/30/2019 with history of worsening generalized weakness and increased dementia, acute respiratory failure to to COVID.PMH includes A Fib, CKD stage 2, MVR, PVD, cardiomyopathy,stroke, advancing dementia. Hard of hearing with no hearing aides  Clinical Impression  This patient was admitted on 05/30/2019 with worsening symptoms related to COVID including fatigue. He has known dementia and has difficulty with processing to complete tasks. According to the daughter, he is becoming increasingly dependent on others for tasks related to dressing and bathing and tends to sleep more during the day. Family expressed concerns re being able to continue to care for him in home environment. He should benefit from skilled PT to address strengthening, safety in gait , and energy conservation. He is on RA, with stable O2 stats 96%+      Follow Up Recommendations SNF;LTACH    Equipment Recommendations       Recommendations for Other Services Other (comment)(Suggest SNF to further determine ability of patient to live in home. Per daughter, family is expressing concerning about continuiing to take care of him.)-     Precautions / Restrictions Precautions Precautions: Fall Restrictions Weight Bearing Restrictions: No      Mobility  Bed Mobility Overal bed mobility: Needs Assistance Bed Mobility: Sidelying to Sit;Supine to Sit;Sit to Supine;Sit to Sidelying;Rolling Rolling: Min guard Sidelying to sit: Min guard Supine to sit: Min assist;Mod assist(Has to have repeated cues/directives) Sit to supine: Min assist Sit to sidelying: Min assist General bed mobility comments: Requires verbal and tactile repeated cues to follow through- hindered not only due to be very hard of hearing, but having dementia  Transfers Overall  transfer level: Needs assistance Equipment used: None Transfers: Sit to/from Stand Sit to Stand: Min guard            Ambulation/Gait Ambulation/Gait assistance: Min guard Gait Distance (Feet): 19 Feet Assistive device: 1 person hand held assist Gait Pattern/deviations: Antalgic;Shuffle        Stairs            Wheelchair Mobility    Modified Rankin (Stroke Patients Only)       Balance Overall balance assessment: Mild deficits observed, not formally tested                                           Pertinent Vitals/Pain Pain Assessment: No/denies pain    Home Living   Living Arrangements: Children Available Help at Discharge: Family;Available 24 hours/day- prior to this hospitalization.   Home Access: Level entry     Home Layout: One level Home Equipment: Grab bars - toilet Additional Comments: Additional information per OT via telephonic communication with daughter    Prior Function Level of Independence: Needs assistance   Gait / Transfers Assistance Needed: While patient states he has a can and "used it sometimes", the daughter (in telephonic conversation ) was told he was independent in ambulation with no AD  ADL's / Homemaking Assistance Needed: Daughter assisted with all meals and peronal hygiene including bathing- she reported did all driving for groceries and appointments        Hand Dominance   Dominant Hand: Right    Extremity/Trunk Assessment   Upper Extremity Assessment Upper Extremity Assessment: Defer to OT evaluation  Lower Extremity Assessment Lower Extremity Assessment: Defer to PT evaluation;Generalized weakness    Cervical / Trunk Assessment Cervical / Trunk Assessment: Kyphotic(mild kyphosis)  Communication   Communication: Deaf(He is very hard of hearing and has no hearing aides)  Cognition     Overall Cognitive Status: History of cognitive impairments - at baseline                                         General Comments General comments (skin integrity, edema, etc.): no edema, but skin fragile and prone to bruising and tears    Exercises General Exercises - Lower Extremity Ankle Circles/Pumps: AROM Quad Sets: AROM Gluteal Sets: AROM Short Arc Quad: AROM Long Arc Quad: AROM Hip Flexion/Marching: AROM   Assessment/Plan    PT Assessment Patient needs continued PT services  PT Problem List Decreased strength;Decreased cognition;Decreased activity tolerance;Decreased safety awareness;Decreased mobility;Decreased balance       PT Treatment Interventions Gait training;Therapeutic activities;Therapeutic exercise    PT Goals (Current goals can be found in the Care Plan section)  Acute Rehab PT Goals Patient Stated Goal: Had difficulty understanding , and states he did not realize he was in the hospital. PT Goal Formulation: Patient unable to participate in goal setting    Frequency Min 2X/week   Barriers to discharge        Co-evaluation               AM-PAC PT "6 Clicks" Mobility  Outcome Measure Help needed turning from your back to your side while in a flat bed without using bedrails?: A Little Help needed moving from lying on your back to sitting on the side of a flat bed without using bedrails?: A Little Help needed moving to and from a bed to a chair (including a wheelchair)?: A Little Help needed standing up from a chair using your arms (e.g., wheelchair or bedside chair)?: A Little Help needed to walk in hospital room?: A Little Help needed climbing 3-5 steps with a railing? : A Lot 6 Click Score: 17    End of Session Equipment Utilized During Treatment: Gait belt Activity Tolerance: Patient tolerated treatment well(Does require verbal and tactile cues- repeated- slow to process and complete tasks) Patient left: in chair;with call bell/phone within reach;with chair alarm set   PT Visit Diagnosis: Muscle weakness (generalized)  (M62.81);Unsteadiness on feet (R26.81)    Time: 1410-1505 PT Time Calculation (min) (ACUTE ONLY): 55 min   Charges:   PT Evaluation $PT Eval Moderate Complexity: 1 Mod PT Treatments $Gait Training: 8-22 mins(10) $Therapeutic Exercise: 8-22 mins(20) $Therapeutic Activity: 8-22 mins(10-safety)        Alexine Pilant P, PT # [phone] CGV cell   Casandra Doffing 06/06/2019, 4:16 PM

## 2019-06-07 LAB — GLUCOSE, CAPILLARY
Glucose-Capillary: 130 mg/dL — ABNORMAL HIGH (ref 70–99)
Glucose-Capillary: 255 mg/dL — ABNORMAL HIGH (ref 70–99)
Glucose-Capillary: 181 mg/dL — ABNORMAL HIGH (ref 70–99)
Glucose-Capillary: 277 mg/dL — ABNORMAL HIGH (ref 70–99)

## 2019-06-07 MED ORDER — INSULIN DETEMIR 100 UNIT/ML ~~LOC~~ SOLN
22.0000 [IU] | Freq: Two times a day (BID) | SUBCUTANEOUS | Status: DC
  Administered 2019-06-07 – 2019-06-08 (×2): 22 [IU] via SUBCUTANEOUS
  Filled 2019-06-07 (×3): qty 0.22

## 2019-06-07 NOTE — Progress Notes (Signed)
Updated family member on plan of care.

## 2019-06-07 NOTE — Consult Note (Signed)
   [facility] Inpatient Consult   06/07/2019  [name] 1945-08-21 [id number]    Patient checked forpotential need of [facility] care management services under his [employer] benefits, with21%risk score for unplanned readmission and hospitalizations.  Patient was engaged by [employer] in the past.   Our community based plan of care has focused on disease management and community resource support.  Chart reviewed and MD brief narrative notes show as: 73 year old with a history of CAD, chronic systolic CHF, pacemaker/ICD, atrial fibrillation, CKD stage III, DM, dementia, and bioprosthetic mitral valve replacement     Patient was admitted to the hospital with a working diagnosis of acute hypoxic respiratory failure due to SARS COVID-19 viral pneumonia.    Review of patient's medical record revealsPT/ OT evaluation completed, and recommendingpatient for skillednursing facility(SNF)/ LTACH. Per therapy notes, patient was living with his daughter prior to admission. Family (daughter) had expressed concerns regarding being able to continue to care for him in home environment.  Primary Care Provider isDr.Philip McGowen with [place] [facility], listed as providing transition of care.  Plan:Willfollow for discharge disposition with inpatient transition of care team.  Please refer to [facility] care management If there are any changes in disposition or needs for appropriate community follow-up.   For questions andreferral, please call:  Edwena Felty A. Inigo Lantigua, BSN, RN-BC [facility] Liaison Cell: [phone]

## 2019-06-07 NOTE — Progress Notes (Signed)
1130Report called to [facility].  Pt and Tele notified that he will be moving to [id number]

## 2019-06-07 NOTE — Progress Notes (Signed)
[name]  ZMO:[id number] DOB: August 15, 1945 DOA: 05/30/2019 PCP: Jeoffrey Massed, MD    Brief Narrative:  73 year old with a history of CAD, chronic systolic CHF, pacemaker/ICD, atrial fibrillation, CKD stage III, DM, dementia, and bioprosthetic mitral valve replacement who presented to the ED 12/22 with SOB.  The patient was seen in the emergency room 12/20 with the same complaints, diagnosed with Covid pneumonia, and was able to be sent home.  On his return visit he was much more hypoxic and required a nonrebreather.  CT of the head was without acute findings.  Significant Events: 12/20 ED evaluation -COVID Pneumonia 12/22 admit via [place]  COVID-19 specific Treatment: Remdesivir 12/22 > 12/26 Actemra 12/23 Decadron 12/22 > 12/28  Subjective: Sitting up in bed.  Appears comfortable.  No evidence of distress.  Is not able to tell me where he is or why he is here but is not agitated.  Assessment & Plan:  COVID Pneumonia -acute hypoxic respiratory failure Has completed a course of remdesivir -given stability of respiratory status discontinued steroid because of severely uncontrolled diabetes -saturations stable on room air at present  Uncontrolled DM2 with severe refractory hyperglycemia Steroids discontinued -CBGs remain quite variable -gently adjust treatment and continue to follow  Chronic systolic CHF Continue diuretic - no gross volume overload on exam - net negative approximately 4.5L since admission    Filed Weights   06/01/19 0500 06/03/19 0500 06/04/19 0500  Weight: 60.3 kg 59.1 kg 51.3 kg    Chronic atrial fibrillation with right atrial thrombus Continue home Eliquis - rate controlled  PVD  Dementia with acute delirium Acute delirium appears to be slowly improving  CAD  AKI on CKD stage III crt stable   DVT prophylaxis: Apixaban Code Status: FULL CODE Family Communication:  Disposition Plan: will need SNF rehab before returning home w/ daughter  -stable medically for discharge when rehab bed located  Consultants:  none  Antimicrobials:  None  Objective: Blood pressure (!) 102/55, pulse 60, temperature 98.1 F (36.7 C), temperature source Oral, resp. rate 18, height 5\' 4"  (1.626 m), weight 51.3 kg, SpO2 93 %.  Intake/Output Summary (Last 24 hours) at 06/07/2019 0940 Last data filed at 06/07/2019 0630 Gross per 24 hour  Intake 220 ml  Output 1200 ml  Net -980 ml   Filed Weights   06/01/19 0500 06/03/19 0500 06/04/19 0500  Weight: 60.3 kg 59.1 kg 51.3 kg    Examination: General: No acute respiratory distress -alert and pleasant but confused Lungs: Clear to auscultation  Cardiovascular: Irregularly irregular -rate controlled Abdomen: NT/ND, soft, BS+, no mass  Extremities: No edema bilateral lower extremities  CBC: Recent Labs  Lab 06/03/19 0059  WBC 18.2*  HGB 12.9*  HCT 41.0  MCV 88.4  PLT 275   Basic Metabolic Panel: Recent Labs  Lab 06/04/19 0940 06/05/19 0255 06/05/19 1620 06/05/19 2100 06/06/19 0440  NA 135 136  --   --  136  K 5.6* 4.7  --   --  4.4  CL 92* 95*  --   --  92*  CO2 30 30  --   --  29  GLUCOSE 201* 173* 527* 430* 133*  BUN 85* 86*  --   --  95*  CREATININE 1.42* 1.39*  --   --  1.42*  CALCIUM 9.4 9.1  --   --  8.7*   GFR: Estimated Creatinine Clearance: 34.6 mL/min (A) (by C-G formula based on SCr of 1.42 mg/dL (H)).  Liver Function Tests: Recent Labs  Lab 06/02/19 0140 06/03/19 0059 06/04/19 0123 06/06/19 0440  AST 19 19 19 22   ALT 16 17 17 21   ALKPHOS 78 86 72 66  BILITOT 0.6 0.6 0.7 0.7  PROT 7.9 8.2* 7.8 7.2  ALBUMIN 3.4* 3.5 3.3* 3.3*    HbA1C: Hemoglobin A1C  Date/Time Value Ref Range Status  12/13/2017 12:00 AM 9.1  Final   Hgb A1c MFr Bld  Date/Time Value Ref Range Status  05/30/2019 10:20 PM 11.1 (H) 4.8 - 5.6 % Final    Comment:    (NOTE) Pre diabetes:          5.7%-6.4% Diabetes:              >6.4% Glycemic control for   <7.0% adults with  diabetes   10/25/2018 09:22 AM 10.8 (H) 4.6 - 6.5 % Final    Comment:    Glycemic Control Guidelines for People with Diabetes:Non Diabetic:  <6%Goal of Therapy: <7%Additional Action Suggested:  >8%     CBG: Recent Labs  Lab 06/06/19 1557 06/06/19 1836 06/06/19 1936 06/06/19 2106 06/07/19 0806  GLUCAP 381* 432* 402* 327* 130*    Recent Results (from the past 240 hour(s))  Blood Culture (routine x 2)     Status: None   Collection Time: 05/30/19  2:00 AM   Specimen: BLOOD RIGHT HAND  Result Value Ref Range Status   Specimen Description BLOOD RIGHT HAND  Final   Special Requests   Final    BOTTLES DRAWN AEROBIC AND ANAEROBIC Blood Culture adequate volume   Culture   Final    NO GROWTH 5 DAYS Performed at [facility], [address] [place], [place] [address]    Report Status 06/04/2019 FINAL  Final  Blood Culture (routine x 2)     Status: None   Collection Time: 05/30/19  2:00 AM   Specimen: BLOOD  Result Value Ref Range Status   Specimen Description BLOOD RIGHT ANTECUBITAL  Final   Special Requests   Final    BOTTLES DRAWN AEROBIC AND ANAEROBIC Blood Culture results may not be optimal due to an excessive volume of blood received in culture bottles   Culture   Final    NO GROWTH 5 DAYS Performed at [facility], [place] [address] [place], [place] [address]    Report Status 06/04/2019 FINAL  Final  MRSA PCR Screening     Status: None   Collection Time: 05/31/19  3:41 PM   Specimen: Nasopharyngeal  Result Value Ref Range Status   MRSA by PCR NEGATIVE NEGATIVE Final    Comment:        The GeneXpert MRSA Assay (FDA approved for NASAL specimens only), is one component of a comprehensive MRSA colonization surveillance program. It is not intended to diagnose MRSA infection nor to guide or monitor treatment for MRSA infections. Performed at [facility], [place] [address], [place],  [address]      Scheduled Meds: . apixaban  5 mg  Oral BID  . atorvastatin  40 mg Oral QPM  . carvedilol  6.25 mg Oral BID WC  . digoxin  0.125 mg Oral Daily  . feeding supplement (ENSURE ENLIVE)  237 mL Oral BID BM  . insulin aspart  0-20 Units Subcutaneous TID WC  . insulin aspart  0-5 Units Subcutaneous QHS  . insulin detemir  18 Units Subcutaneous BID  . lactulose  30 g Oral Daily  . multivitamin  with minerals  1 tablet Oral Daily  . thiamine  100 mg Oral QHS  . torsemide  80 mg Oral Daily     LOS: 8 days   Lonia Blood, MD [name] Office  [phone] Pager - Text Page per Amion  If 7PM-7AM, please contact night-coverage per Amion 06/07/2019, 9:40 AM

## 2019-06-08 LAB — GLUCOSE, CAPILLARY
Glucose-Capillary: 106 mg/dL — ABNORMAL HIGH (ref 70–99)
Glucose-Capillary: 288 mg/dL — ABNORMAL HIGH (ref 70–99)
Glucose-Capillary: 259 mg/dL — ABNORMAL HIGH (ref 70–99)
Glucose-Capillary: 255 mg/dL — ABNORMAL HIGH (ref 70–99)

## 2019-06-08 LAB — BASIC METABOLIC PANEL
Anion gap: 11 (ref 5–15)
BUN: 82 mg/dL — ABNORMAL HIGH (ref 8–23)
CO2: 29 mmol/L (ref 22–32)
Calcium: 8.6 mg/dL — ABNORMAL LOW (ref 8.9–10.3)
Chloride: 96 mmol/L — ABNORMAL LOW (ref 98–111)
Creatinine, Ser: 1.41 mg/dL — ABNORMAL HIGH (ref 0.61–1.24)
GFR calc Af Amer: 58 mL/min — ABNORMAL LOW (ref >60)
GFR calc non Af Amer: 50 mL/min — ABNORMAL LOW (ref >60)
Glucose, Bld: 116 mg/dL — ABNORMAL HIGH (ref 70–99)
Potassium: 4.3 mmol/L (ref 3.5–5.1)
Sodium: 136 mmol/L (ref 135–145)

## 2019-06-08 MED ORDER — INSULIN DETEMIR 100 UNIT/ML ~~LOC~~ SOLN
25.0000 [IU] | Freq: Two times a day (BID) | SUBCUTANEOUS | Status: DC
  Administered 2019-06-08 – 2019-06-09 (×2): 25 [IU] via SUBCUTANEOUS
  Filled 2019-06-08 (×2): qty 0.25

## 2019-06-08 NOTE — Progress Notes (Signed)
[name]  QIH:[id number] DOB: Mar 29, 1946 DOA: 05/30/2019 PCP: Tammi Sou, MD    Brief Narrative:  73 year old with a history of CAD, chronic systolic CHF, pacemaker/ICD, atrial fibrillation, CKD stage III, DM, dementia, and bioprosthetic mitral valve replacement who presented to the ED 12/22 with SOB.  The patient was seen in the emergency room 12/20 with the same complaints, diagnosed with Covid pneumonia, and was able to be sent home.  On his return visit he was much more hypoxic and required a nonrebreather.  CT of the head was without acute findings.  Significant Events: 12/20 ED evaluation -COVID Pneumonia 12/22 admit via [place]  COVID-19 specific Treatment: Remdesivir 12/22 > 12/26 Actemra 12/23 Decadron 12/22 > 12/28  Subjective: Resting comfortably in a bedside chair eating his lunch.  Appears comfortable and content.  No respiratory distress.  Denies any complaints.  Assessment & Plan:  COVID Pneumonia -acute hypoxic respiratory failure Has completed a course of remdesivir -given stability of respiratory status discontinued steroid because of severely uncontrolled diabetes -saturations stable on room air   Uncontrolled DM2 with severe refractory hyperglycemia Steroids discontinued -CBGs not yet ideal -adjust treatment again today and follow  Chronic systolic CHF Continue diuretic - no gross volume overload on exam - net negative approximately 4.5L since admission    Filed Weights   06/01/19 0500 06/03/19 0500 06/04/19 0500  Weight: 60.3 kg 59.1 kg 51.3 kg    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 rate 18, height 5\' 4"  (1.626 m), weight 51.3 kg, SpO2 97 %.  Intake/Output Summary (Last 24 hours) at 06/08/2019 1037 Last data filed at 06/08/2019 0851 Gross per 24 hour  Intake 960 ml  Output 600 ml  Net 360 ml   Filed Weights   06/01/19 0500 06/03/19 0500 06/04/19 0500  Weight: 60.3 kg 59.1 kg 51.3 kg    Examination: General: No acute respiratory distress  Lungs: Clear to auscultation  Cardiovascular: Irregularly irregular -rate controlled Abdomen: NT/ND, soft, BS+, no mass  Extremities: No edema B LE   CBC: Recent Labs  Lab 06/03/19 0059  WBC 18.2*  HGB 12.9*  HCT 41.0  MCV 88.4  PLT 875   Basic Metabolic Panel: Recent Labs  Lab 06/05/19 0255 06/05/19 2100 06/06/19 0440 06/08/19 0552  NA 136  --  136 136  K 4.7  --  4.4 4.3  CL 95*  --  92* 96*  CO2 30  --  29 29  GLUCOSE 173* 430* 133* 116*  BUN 86*  --  95* 82*  CREATININE 1.39*  --  1.42* 1.41*  CALCIUM 9.1  --  8.7* 8.6*   GFR: Estimated Creatinine Clearance: 34.9 mL/min (A) (by C-G formula based on SCr of 1.41 mg/dL (H)).  Liver Function Tests: Recent Labs  Lab 06/02/19 0140 06/03/19 0059 06/04/19 0123 06/06/19 0440  AST 19 19 19 22   ALT 16 17 17 21   ALKPHOS 78 86 72 66  BILITOT 0.6 0.6 0.7 0.7  PROT 7.9 8.2* 7.8 7.2  ALBUMIN 3.4* 3.5 3.3* 3.3*    HbA1C: Hemoglobin A1C  Date/Time Value Ref Range Status  12/13/2017 12:00 AM 9.1  Final   Hgb A1c MFr Bld  Date/Time Value Ref Range Status  05/30/2019 10:20 PM 11.1 (H) 4.8 - 5.6 % Final    Comment:    (NOTE) Pre diabetes:          5.7%-6.4% Diabetes:              >6.4% Glycemic control for   <7.0% adults with diabetes   10/25/2018 09:22 AM 10.8 (H) 4.6 - 6.5 % Final    Comment:    Glycemic Control Guidelines  for People with Diabetes:Non Diabetic:  <6%Goal of Therapy: <7%Additional Action Suggested:  >8%     CBG: Recent Labs  Lab 06/07/19 0806 06/07/19 1152 06/07/19 1654 06/07/19 2126 06/08/19 0749  GLUCAP 130* 255* 181* 277* 106*    Recent Results (from the past 240 hour(s))  Blood Culture (routine x 2)     Status: None   Collection Time: 05/30/19  2:00 AM   Specimen: BLOOD RIGHT HAND  Result Value Ref Range Status   Specimen Description BLOOD RIGHT HAND  Final   Special Requests   Final    BOTTLES DRAWN AEROBIC AND ANAEROBIC Blood Culture adequate volume   Culture   Final    NO GROWTH 5 DAYS Performed at [facility], [address] [place], [place] [address]    Report Status 06/04/2019 FINAL  Final  Blood Culture (routine x 2)     Status: None   Collection Time: 05/30/19  2:00 AM   Specimen: BLOOD  Result Value Ref Range Status   Specimen Description BLOOD RIGHT ANTECUBITAL  Final   Special Requests   Final    BOTTLES DRAWN AEROBIC AND ANAEROBIC Blood Culture results may not be optimal due to an excessive volume of blood received in culture bottles   Culture   Final    NO GROWTH 5 DAYS Performed at [facility], [address], [place], [place] [address]    Report Status 06/04/2019 FINAL  Final  MRSA PCR Screening     Status: None   Collection Time: 05/31/19  3:41 PM   Specimen: Nasopharyngeal  Result Value Ref Range Status   MRSA by PCR NEGATIVE NEGATIVE Final    Comment:        The GeneXpert MRSA Assay (FDA approved for NASAL specimens only), is one component of a comprehensive MRSA colonization surveillance program. It is not intended to diagnose MRSA infection nor to guide or monitor treatment for MRSA infections. Performed at [facility], [address], [place], [place] [address]      Scheduled Meds: . apixaban  5 mg Oral BID  . atorvastatin  40 mg Oral QPM  . carvedilol  6.25 mg Oral BID WC  . digoxin  0.125 mg Oral  Daily  . feeding supplement (ENSURE ENLIVE)  237 mL Oral BID BM  . insulin aspart  0-20 Units Subcutaneous TID WC  . insulin aspart  0-5 Units Subcutaneous QHS  . insulin detemir  22 Units Subcutaneous BID  . lactulose  30 g Oral Daily  . multivitamin with minerals  1 tablet Oral Daily  . thiamine  100 mg Oral QHS  . torsemide  80 mg Oral Daily     LOS: 9 days   Tinnie Gens T.  Sharon Seller, MD [name] Office  [phone] Pager - Text Page per Amion  If 7PM-7AM, please contact night-coverage per Amion 06/08/2019, 10:37 AM

## 2019-06-08 NOTE — Progress Notes (Signed)
Called and spoke with daughter, Donella Stade, to update on patient. Questions answered and no concerns voiced per daughter.

## 2019-06-08 NOTE — Plan of Care (Signed)
Patient has had no issues throughout shift. Slightly drowsy this pm, but no change in orientation. Patient's SBP <100 and did not administer pm coreg. Up x1 assist to [facility]. Remains on RA. Call light in reach, bed alarm in place.    Problem: Education: Goal: Ability to describe self-care measures that may prevent or decrease complications (Diabetes Survival Skills Education) will improve Outcome: Progressing   Problem: Coping: Goal: Ability to adjust to condition or change in health will improve Outcome: Progressing   Problem: Fluid Volume: Goal: Ability to maintain a balanced intake and output will improve Outcome: Progressing   Problem: Cardiac: Goal: Ability to achieve and maintain adequate cardiopulmonary perfusion will improve Outcome: Progressing   Problem: Education: Goal: Ability to describe self-care measures that may prevent or decrease complications (Diabetes Survival Skills Education) will improve Outcome: Progressing   Problem: Coping: Goal: Ability to adjust to condition or change in health will improve Outcome: Progressing   Problem: Fluid Volume: Goal: Ability to maintain a balanced intake and output will improve Outcome: Progressing   Problem: Health Behavior/Discharge Planning: Goal: Ability to identify and utilize available resources and services will improve Outcome: Progressing Goal: Ability to manage health-related needs will improve Outcome: Progressing   Problem: Metabolic: Goal: Ability to maintain appropriate glucose levels will improve Outcome: Progressing   Problem: Nutritional: Goal: Maintenance of adequate nutrition will improve Outcome: Progressing Goal: Progress toward achieving an optimal weight will improve Outcome: Progressing   Problem: Skin Integrity: Goal: Risk for impaired skin integrity will decrease Outcome: Progressing   Problem: Tissue Perfusion: Goal: Adequacy of tissue perfusion will improve Outcome: Progressing    Problem: Education: Goal: Knowledge of risk factors and measures for prevention of condition will improve Outcome: Progressing   Problem: Coping: Goal: Psychosocial and spiritual needs will be supported Outcome: Progressing   Problem: Respiratory: Goal: Will maintain a patent airway Outcome: Progressing Goal: Complications related to the disease process, condition or treatment will be avoided or minimized Outcome: Progressing   Problem: Education: Goal: Ability to demonstrate management of disease process will improve Outcome: Progressing Goal: Ability to verbalize understanding of medication therapies will improve Outcome: Progressing   Problem: Activity: Goal: Capacity to carry out activities will improve Outcome: Progressing   Problem: Cardiac: Goal: Ability to achieve and maintain adequate cardiopulmonary perfusion will improve Outcome: Progressing

## 2019-06-08 NOTE — Progress Notes (Signed)
Inpatient Diabetes Program Recommendations  AACE/ADA: New Consensus Statement on Inpatient Glycemic Control (2015)  Target Ranges:  Prepandial:   less than 140 mg/dL      Peak postprandial:   less than 180 mg/dL (1-2 hours)      Critically ill patients:  140 - 180 mg/dL   Lab Results  Component Value Date   GLUCAP 288 (H) 06/08/2019   HGBA1C 11.1 (H) 05/30/2019    Review of Glycemic Control  Post-prandials elevated. Would benefit from addition of meal coverage insulin  Inpatient Diabetes Program Recommendations:    Consider adding Novolog 4 units tidwc for meal coverage insulin if pt eats > 50% meal.  Will continue to follow.  Thank you. Lorenda Peck, RD, LDN, CDE Inpatient Diabetes Coordinator [phone]

## 2019-06-08 NOTE — NC FL2 (Signed)
[place] LEVEL OF CARE SCREENING TOOL     IDENTIFICATION  Patient Name: [name] Birthdate: 1945/12/09 Sex: male Admission Date (Current Location): 05/30/2019  [facility] and [place] Number:  Herbalist and Address:  [facility] [place] [employer], [place] [address] [place], [place] [address]      Provider Number: [id number]  Attending Physician Name and Address:  Cherene Altes, MD  Relative Name and Phone Number:  Effie Shy [phone]    Current Level of Care: Hospital Recommended Level of Care: [place] Prior Approval Number:    Date Approved/Denied:   PASRR Number: [id number]  Discharge Plan: SNF    Current Diagnoses: Patient Active Problem List   Diagnosis Date Noted  . Uncontrolled type 2 diabetes mellitus with stage 3 chronic kidney disease, with long-term current use of insulin ([place]) 06/02/2019  . Acute respiratory failure due to COVID-19 ([place]) 05/30/2019  . Acute respiratory failure with hypoxia ([place]) 05/30/2019  . Uncontrolled type 2 diabetes mellitus with hyperglycemia ([place]) 03/04/2018  . Acute on chronic respiratory failure with hypercapnia ([place])   . COPD with chronic bronchitis and emphysema ([place])   . Acute encephalopathy   . Acute respiratory failure ([place])   . Acute on chronic combined systolic (congestive) and diastolic (congestive) heart failure ([place]) 10/21/2017  . Chronic hypoxemic respiratory failure ([place]) 10/21/2017  . Bipolar disorder ([place]) 10/21/2017  . Chronic atrial fibrillation ([place]) 10/21/2017  . History of CVA (cerebrovascular accident) 10/21/2017  . COPD (chronic obstructive pulmonary disease) ([place]) 10/21/2017  . Tobacco abuse 10/21/2017  . History of alcohol use 10/21/2017  . Biventricular automatic implantable cardioverter defibrillator in situ 10/21/2017  . Dementia  10/21/2017  . History of mitral valve replacement 10/21/2017  . Coronary artery disease 10/21/2017  .  Situational depression 10/21/2017  . Grief reaction 10/21/2017  . Chronic respiratory failure with hypoxia and hypercapnia ([place]) 06/22/2017  . CHF exacerbation ([place]) 06/06/2017  . Acute respiratory failure with hypoxia and hypercapnia (H[facility])   . COPD GOLD 0 still smoking  03/02/2016  . Chronic kidney disease (CKD), stage II (mild) 11/23/2015  . CAD (coronary artery disease)   . Dementia ([place])   . Chronic respiratory failure with hypoxia ([place]) 10/11/2015  . Chronic systolic CHF (congestive heart failure) ([place]) 05/30/2015  . Cigarette smoker 12/24/2014  . Stroke ([place])   . Mitral valve disease   . Depression   . Mixed Ischemic and Nonischemic Cardiomyopathy   . Alcohol abuse   . Chronic anticoagulation 08/22/2014  . PVD (peripheral vascular disease) ([place]) 02/28/2014  . [place] thrombus of heart 02/07/2014  . Hypertension 06/14/2012  . Automatic implantable cardioverter-defibrillator in situ 04/04/2012  . Hyperlipidemia 10/27/2011  . Ventricular tachycardia ([place]) 07/14/2011  . Atrial fibrillation, chronic ([place]) 07/13/2011  . Right atrial thrombus ([place]) 07/11/2011    Orientation RESPIRATION BLADDER Height & Weight     Self, Time, Situation, Place  Normal Continent Weight: 51.3 kg Height:  5\' 4"  (162.6 cm)  BEHAVIORAL SYMPTOMS/MOOD NEUROLOGICAL BOWEL NUTRITION STATUS  Other (Comment)(N/A) (N/A) Continent Diet(see discharge summary)  AMBULATORY STATUS COMMUNICATION OF NEEDS Skin   Limited Assist Verbally Other (Comment)(Contact dermatitis bilateral back legs)                       Personal Care Assistance Level of Assistance  Bathing, Feeding, Dressing Bathing Assistance: Limited assistance Feeding assistance: Limited assistance Dressing Assistance: Limited assistance     Functional Limitations Info  Sight, Hearing, Speech Sight Info: Impaired Hearing Info: Impaired Speech Info: Adequate    SPECIAL CARE FACTORS FREQUENCY  PT (By licensed PT), OT (By licensed OT)      PT Frequency: Min 2X/week OT Frequency: Min 2X/week            Contractures Contractures Info: Not present    Additional Factors Info  Code Status, Allergies, Isolation Precautions, Insulin Sliding Scale Code Status Info: Full Code Allergies Info: Warfarin   Insulin Sliding Scale Info: 3 times daily with meals Isolation Precautions Info: COVID-19+ (Contact/Droplets)     Current Medications (06/08/2019):  This is the current hospital active medication list Current Facility-Administered Medications  Medication Dose Route Frequency Provider Last Rate Last Admin  . acetaminophen (TYLENOL) tablet 650 mg  650 mg Oral Q6H PRN Hollice Espy, MD      . apixaban (ELIQUIS) tablet 5 mg  5 mg Oral BID Hollice Espy, MD   5 mg at 06/08/19 0936  . atorvastatin (LIPITOR) tablet 40 mg  40 mg Oral QPM Hollice Espy, MD   40 mg at 06/07/19 1652  . carvedilol (COREG) tablet 6.25 mg  6.25 mg Oral BID WC Hollice Espy, MD   6.25 mg at 06/08/19 0806  . dextrose 50 % solution 0-50 mL  0-50 mL Intravenous PRN Hollice Espy, MD      . digoxin (LANOXIN) tablet 0.125 mg  0.125 mg Oral Daily Hollice Espy, MD   0.125 mg at 06/08/19 8563  . feeding supplement (ENSURE ENLIVE) (ENSURE ENLIVE) liquid 237 mL  237 mL Oral BID BM Lonia Blood, MD   237 mL at 06/08/19 1341  . insulin aspart (novoLOG) injection 0-20 Units  0-20 Units Subcutaneous TID WC Coletta Memos, MD   11 Units at 06/08/19 1143  . insulin aspart (novoLOG) injection 0-5 Units  0-5 Units Subcutaneous QHS Coletta Memos, MD   3 Units at 06/07/19 2153  . insulin detemir (LEVEMIR) injection 22 Units  22 Units Subcutaneous BID Lonia Blood, MD   22 Units at 06/08/19 [phone]  . lactulose (CHRONULAC) 10 GM/15ML solution 30 g  30 g Oral Daily Hollice Espy, MD   30 g at 06/08/19 0263  . multivitamin with minerals tablet 1 tablet  1 tablet Oral Daily Hollice Espy, MD   1 tablet at 06/08/19 0936  .  nitroGLYCERIN (NITROSTAT) SL tablet 0.4 mg  0.4 mg Sublingual Q5 min PRN Hollice Espy, MD      . ondansetron [facility]) tablet 4 mg  4 mg Oral Q6H PRN Hollice Espy, MD       Or  . ondansetron [facility]) injection 4 mg  4 mg Intravenous Q6H PRN Hollice Espy, MD      . thiamine tablet 100 mg  100 mg Oral QHS Hollice Espy, MD   100 mg at 06/07/19 2150  . torsemide (DEMADEX) tablet 80 mg  80 mg Oral Daily Lonia Blood, MD   80 mg at 06/08/19 7858     Discharge Medications: Please see discharge summary for a list of discharge medications.  Relevant Imaging Results:  Relevant Lab Results:   Additional Information SSN: [id number]  Colleen Can MSN, RN, NCM-BC, ACM-RN [phone]

## 2019-06-09 LAB — GLUCOSE, CAPILLARY
Glucose-Capillary: 114 mg/dL — ABNORMAL HIGH (ref 70–99)
Glucose-Capillary: 200 mg/dL — ABNORMAL HIGH (ref 70–99)
Glucose-Capillary: 301 mg/dL — ABNORMAL HIGH (ref 70–99)
Glucose-Capillary: 292 mg/dL — ABNORMAL HIGH (ref 70–99)

## 2019-06-09 MED ORDER — INSULIN DETEMIR 100 UNIT/ML ~~LOC~~ SOLN
8.0000 [IU] | Freq: Once | SUBCUTANEOUS | Status: AC
  Administered 2019-06-09: 8 [IU] via SUBCUTANEOUS
  Filled 2019-06-09: qty 0.08

## 2019-06-09 MED ORDER — INSULIN DETEMIR 100 UNIT/ML ~~LOC~~ SOLN
25.0000 [IU] | Freq: Every day | SUBCUTANEOUS | Status: DC
  Administered 2019-06-09 – 2019-06-13 (×5): 25 [IU] via SUBCUTANEOUS
  Filled 2019-06-09 (×6): qty 0.25

## 2019-06-09 MED ORDER — INSULIN DETEMIR 100 UNIT/ML ~~LOC~~ SOLN: 25.0000 [IU] | Freq: Every day | SUBCUTANEOUS | Status: DC

## 2019-06-09 MED ORDER — INSULIN ASPART 100 UNIT/ML ~~LOC~~ SOLN
3.0000 [IU] | Freq: Three times a day (TID) | SUBCUTANEOUS | Status: DC
  Administered 2019-06-09 – 2019-06-10 (×4): 3 [IU] via SUBCUTANEOUS

## 2019-06-09 MED ORDER — INSULIN DETEMIR 100 UNIT/ML ~~LOC~~ SOLN: 32.0000 [IU] | Freq: Every day | SUBCUTANEOUS | Status: DC

## 2019-06-09 MED ORDER — INSULIN DETEMIR 100 UNIT/ML ~~LOC~~ SOLN
32.0000 [IU] | Freq: Every day | SUBCUTANEOUS | Status: DC
  Administered 2019-06-10: 32 [IU] via SUBCUTANEOUS
  Filled 2019-06-09: qty 0.32

## 2019-06-09 NOTE — Progress Notes (Signed)
[name]  KZS:[id number] DOB: Oct 10, 1945 DOA: 05/30/2019 PCP: Jeoffrey Massed, MD    Brief Narrative:  74 year old with a history of CAD, chronic systolic CHF, pacemaker/ICD, atrial fibrillation, CKD stage III, DM, dementia, and bioprosthetic mitral valve replacement who presented to the ED 12/22 with SOB.  The patient was seen in the emergency room 12/20 with the same complaints, diagnosed with Covid pneumonia, and was able to be sent home.  On his return visit he was much more hypoxic and required a nonrebreather.  CT of the head was without acute findings.  Significant Events: 12/20 ED evaluation -COVID Pneumonia 12/22 admit via [place]  COVID-19 specific Treatment: Remdesivir 12/22 > 12/26 Actemra 12/23 Decadron 12/22 > 12/28  Subjective: Sitting up at side of bed enjoying his lunch.  Denies any new complaints.  Assessment & Plan:  COVID Pneumonia -acute hypoxic respiratory failure Has completed a course of remdesivir -given stability of respiratory status discontinued steroid because of severely uncontrolled diabetes -saturations stable on room air   Uncontrolled DM2 with severe refractory hyperglycemia Steroids discontinued -CBGs not yet ideal -add meal coverage today and follow  Chronic systolic CHF Continue diuretic - no gross volume overload on exam    Filed Weights   06/03/19 0500 06/04/19 0500 06/09/19 0410  Weight: 59.1 kg 51.3 kg 64 kg    Chronic atrial fibrillation with right atrial thrombus Continue home Eliquis - rate controlled  PVD  Dementia with acute delirium Acute delirium appears to be slowly improving - alert and pleasant d  CAD  AKI on CKD stage III crt stable   DVT prophylaxis: Apixaban Code Status: FULL CODE Family Communication:  Disposition Plan: will need SNF rehab before returning home w/ daughter -stable medically for discharge when rehab bed located  Consultants:  none  Antimicrobials:  None  Objective: Blood  pressure 109/60, pulse 96, temperature 97.6 F (36.4 C), temperature source Oral, resp. rate 16, height 5\' 4"  (1.626 m), weight 64 kg, SpO2 97 %.  Intake/Output Summary (Last 24 hours) at 06/09/2019 1027 Last data filed at 06/09/2019 0100 Gross per 24 hour  Intake 560 ml  Output --  Net 560 ml   Filed Weights   06/03/19 0500 06/04/19 0500 06/09/19 0410  Weight: 59.1 kg 51.3 kg 64 kg    Examination: General: No acute respiratory distress  Lungs: Clear to auscultation - no wheezing  Cardiovascular: Irregularly irregular  Abdomen: NT/ND, soft Extremities: No edema B LE   CBC: Recent Labs  Lab 06/03/19 0059  WBC 18.2*  HGB 12.9*  HCT 41.0  MCV 88.4  PLT 275   Basic Metabolic Panel: Recent Labs  Lab 06/05/19 0255 06/05/19 2100 06/06/19 0440 06/08/19 0552  NA 136  --  136 136  K 4.7  --  4.4 4.3  CL 95*  --  92* 96*  CO2 30  --  29 29  GLUCOSE 173* 430* 133* 116*  BUN 86*  --  95* 82*  CREATININE 1.39*  --  1.42* 1.41*  CALCIUM 9.1  --  8.7* 8.6*   GFR: Estimated Creatinine Clearance: 40.2 mL/min (A) (by C-G formula based on SCr of 1.41 mg/dL (H)).  Liver Function Tests: Recent Labs  Lab 06/03/19 0059 06/04/19 0123 06/06/19 0440  AST 19 19 22   ALT 17 17 21   ALKPHOS 86 72 66  BILITOT 0.6 0.7 0.7  PROT 8.2* 7.8 7.2  ALBUMIN 3.5 3.3* 3.3*    HbA1C: Hemoglobin A1C  Date/Time Value Ref  Range Status  12/13/2017 12:00 AM 9.1  Final   Hgb A1c MFr Bld  Date/Time Value Ref Range Status  05/30/2019 10:20 PM 11.1 (H) 4.8 - 5.6 % Final    Comment:    (NOTE) Pre diabetes:          5.7%-6.4% Diabetes:              >6.4% Glycemic control for   <7.0% adults with diabetes   10/25/2018 09:22 AM 10.8 (H) 4.6 - 6.5 % Final    Comment:    Glycemic Control Guidelines for People with Diabetes:Non Diabetic:  <6%Goal of Therapy: <7%Additional Action Suggested:  >8%     CBG: Recent Labs  Lab 06/08/19 0749 06/08/19 1139 06/08/19 1547 06/08/19 2150 06/09/19 0737   GLUCAP 106* 288* 259* 255* 114*    Recent Results (from the past 240 hour(s))  MRSA PCR Screening     Status: None   Collection Time: 05/31/19  3:41 PM   Specimen: Nasopharyngeal  Result Value Ref Range Status   MRSA by PCR NEGATIVE NEGATIVE Final    Comment:        The GeneXpert MRSA Assay (FDA approved for NASAL specimens only), is one component of a comprehensive MRSA colonization surveillance program. It is not intended to diagnose MRSA infection nor to guide or monitor treatment for MRSA infections. Performed at [facility], [place] [address], [place], [place] [address]      Scheduled Meds: . apixaban  5 mg Oral BID  . atorvastatin  40 mg Oral QPM  . carvedilol  6.25 mg Oral BID WC  . digoxin  0.125 mg Oral Daily  . feeding supplement (ENSURE ENLIVE)  237 mL Oral BID BM  . insulin aspart  0-20 Units Subcutaneous TID WC  . insulin aspart  0-5 Units Subcutaneous QHS  . insulin detemir  25 Units Subcutaneous BID  . lactulose  30 g Oral Daily  . multivitamin with minerals  1 tablet Oral Daily  . thiamine  100 mg Oral QHS  . torsemide  80 mg Oral Daily     LOS: 10 days   Cherene Altes, MD [name] Office  [phone] Pager - Text Page per Amion  If 7PM-7AM, please contact night-coverage per Amion 06/09/2019, 10:27 AM

## 2019-06-09 NOTE — Plan of Care (Signed)
Patient has done well today without complications. BP low this pm again. Notified service. Patient currently up in chair eating supper. Call light in reach and chair alarm in place.   Problem: Education: Goal: Ability to describe self-care measures that may prevent or decrease complications (Diabetes Survival Skills Education) will improve Outcome: Progressing   Problem: Coping: Goal: Ability to adjust to condition or change in health will improve Outcome: Progressing   Problem: Fluid Volume: Goal: Ability to maintain a balanced intake and output will improve Outcome: Progressing   Problem: Cardiac: Goal: Ability to achieve and maintain adequate cardiopulmonary perfusion will improve Outcome: Progressing   Problem: Education: Goal: Ability to describe self-care measures that may prevent or decrease complications (Diabetes Survival Skills Education) will improve Outcome: Progressing   Problem: Coping: Goal: Ability to adjust to condition or change in health will improve Outcome: Progressing   Problem: Fluid Volume: Goal: Ability to maintain a balanced intake and output will improve Outcome: Progressing   Problem: Health Behavior/Discharge Planning: Goal: Ability to identify and utilize available resources and services will improve Outcome: Progressing Goal: Ability to manage health-related needs will improve Outcome: Progressing   Problem: Metabolic: Goal: Ability to maintain appropriate glucose levels will improve Outcome: Progressing   Problem: Nutritional: Goal: Maintenance of adequate nutrition will improve Outcome: Progressing Goal: Progress toward achieving an optimal weight will improve Outcome: Progressing   Problem: Skin Integrity: Goal: Risk for impaired skin integrity will decrease Outcome: Progressing   Problem: Tissue Perfusion: Goal: Adequacy of tissue perfusion will improve Outcome: Progressing   Problem: Education: Goal: Knowledge of risk factors  and measures for prevention of condition will improve Outcome: Progressing   Problem: Coping: Goal: Psychosocial and spiritual needs will be supported Outcome: Progressing   Problem: Respiratory: Goal: Will maintain a patent airway Outcome: Progressing Goal: Complications related to the disease process, condition or treatment will be avoided or minimized Outcome: Progressing   Problem: Education: Goal: Ability to demonstrate management of disease process will improve Outcome: Progressing Goal: Ability to verbalize understanding of medication therapies will improve Outcome: Progressing   Problem: Activity: Goal: Capacity to carry out activities will improve Outcome: Progressing   Problem: Cardiac: Goal: Ability to achieve and maintain adequate cardiopulmonary perfusion will improve Outcome: Progressing

## 2019-06-09 NOTE — Progress Notes (Signed)
Spoke with [name] to update on patient's status and condition. All questions answered.

## 2019-06-10 LAB — CBC
HCT: 41.1 % (ref 39.0–52.0)
Hemoglobin: 12.8 g/dL — ABNORMAL LOW (ref 13.0–17.0)
MCH: 27.8 pg (ref 26.0–34.0)
MCHC: 31.1 g/dL (ref 30.0–36.0)
MCV: 89.3 fL (ref 80.0–100.0)
Platelets: 155 10*3/uL (ref 150–400)
RBC: 4.6 MIL/uL (ref 4.22–5.81)
RDW: 13.5 % (ref 11.5–15.5)
WBC: 10.7 10*3/uL — ABNORMAL HIGH (ref 4.0–10.5)
nRBC: 0 % (ref 0.0–0.2)

## 2019-06-10 LAB — BASIC METABOLIC PANEL
Anion gap: 13 (ref 5–15)
BUN: 64 mg/dL — ABNORMAL HIGH (ref 8–23)
CO2: 31 mmol/L (ref 22–32)
Calcium: 9 mg/dL (ref 8.9–10.3)
Chloride: 95 mmol/L — ABNORMAL LOW (ref 98–111)
Creatinine, Ser: 1.34 mg/dL — ABNORMAL HIGH (ref 0.61–1.24)
GFR calc Af Amer: >60 mL/min (ref >60)
GFR calc non Af Amer: 53 mL/min — ABNORMAL LOW (ref >60)
Glucose, Bld: 119 mg/dL — ABNORMAL HIGH (ref 70–99)
Potassium: 4.2 mmol/L (ref 3.5–5.1)
Sodium: 139 mmol/L (ref 135–145)

## 2019-06-10 LAB — GLUCOSE, CAPILLARY
Glucose-Capillary: 131 mg/dL — ABNORMAL HIGH (ref 70–99)
Glucose-Capillary: 200 mg/dL — ABNORMAL HIGH (ref 70–99)
Glucose-Capillary: 293 mg/dL — ABNORMAL HIGH (ref 70–99)
Glucose-Capillary: 172 mg/dL — ABNORMAL HIGH (ref 70–99)

## 2019-06-10 LAB — MAGNESIUM: Magnesium: 2.4 mg/dL (ref 1.7–2.4)

## 2019-06-10 MED ORDER — INSULIN ASPART 100 UNIT/ML ~~LOC~~ SOLN
6.0000 [IU] | Freq: Three times a day (TID) | SUBCUTANEOUS | Status: DC
  Administered 2019-06-11 – 2019-06-14 (×11): 6 [IU] via SUBCUTANEOUS

## 2019-06-10 MED ORDER — INSULIN DETEMIR 100 UNIT/ML ~~LOC~~ SOLN
36.0000 [IU] | Freq: Every day | SUBCUTANEOUS | Status: DC
  Administered 2019-06-11 – 2019-06-14 (×4): 36 [IU] via SUBCUTANEOUS
  Filled 2019-06-10 (×4): qty 0.36

## 2019-06-10 NOTE — Progress Notes (Signed)
[name]  TDD:[id number] DOB: 11/04/1945 DOA: 05/30/2019 PCP: Tammi Sou, MD    Brief Narrative:  74 year old with a history of CAD, chronic systolic CHF, pacemaker/ICD, atrial fibrillation, CKD stage III, DM, dementia, and bioprosthetic mitral valve replacement who presented to the ED 12/22 with SOB.  The patient was seen in the emergency room 12/20 with the same complaints, diagnosed with Covid pneumonia, and was able to be sent home.  On his return visit he was much more hypoxic and required a nonrebreather.  CT of the head was without acute findings.  Significant Events: 12/20 ED evaluation -COVID Pneumonia 12/22 admit via [place]  COVID-19 specific Treatment: Remdesivir 12/22 > 12/26 Actemra 12/23 Decadron 12/22 > 12/28  Subjective: Resting comfortably in bed enjoying his lunch.  Denies any new complaints.  Assessment & Plan:  COVID Pneumonia -acute hypoxic respiratory failure Has completed a course of remdesivir and Decadron treatment was discontinued 12/28 - saturations stable on room air   Uncontrolled DM2 with severe refractory hyperglycemia Steroids discontinued -CBGs remain high during the day -will adjust a.m. insulin dose again today  Chronic systolic CHF Continue diuretic - no gross volume overload on exam    Filed Weights   06/04/19 0500 06/09/19 0410 06/10/19 0423  Weight: 51.3 kg 64 kg 64.1 kg    Chronic atrial fibrillation with right atrial thrombus Continue home Eliquis - rate controlled  PVD  Dementia with acute delirium Acute delirium appears to be slowly improving - alert and pleasant d  CAD  AKI on CKD stage III crt stable   DVT prophylaxis: Apixaban Code Status: FULL CODE Family Communication:  Disposition Plan: will need SNF rehab before returning home w/ daughter -stable medically for discharge when rehab bed located  Consultants:  none  Antimicrobials:  None  Objective: Blood pressure 100/71, pulse 60, temperature  97.8 F (36.6 C), temperature source Oral, resp. rate 16, height 5\' 4"  (1.626 m), weight 64.1 kg, SpO2 93 %.  Intake/Output Summary (Last 24 hours) at 06/10/2019 1006 Last data filed at 06/09/2019 2300 Gross per 24 hour  Intake 480 ml  Output --  Net 480 ml   Filed Weights   06/04/19 0500 06/09/19 0410 06/10/19 0423  Weight: 51.3 kg 64 kg 64.1 kg    Examination: General: No acute respiratory distress  Lungs: Clear to auscultation - no wheezing  Cardiovascular: Irregularly irregular  Abdomen: NT/ND, soft Extremities: No edema B lower extremities  CBC: Recent Labs  Lab 06/10/19 0415  WBC 10.7*  HGB 12.8*  HCT 41.1  MCV 89.3  PLT 623   Basic Metabolic Panel: Recent Labs  Lab 06/06/19 0440 06/08/19 0552 06/10/19 0415  NA 136 136 139  K 4.4 4.3 4.2  CL 92* 96* 95*  CO2 29 29 31   GLUCOSE 133* 116* 119*  BUN 95* 82* 64*  CREATININE 1.42* 1.41* 1.34*  CALCIUM 8.7* 8.6* 9.0  MG  --   --  2.4   GFR: Estimated Creatinine Clearance: 42.3 mL/min (A) (by C-G formula based on SCr of 1.34 mg/dL (H)).  Liver Function Tests: Recent Labs  Lab 06/04/19 0123 06/06/19 0440  AST 19 22  ALT 17 21  ALKPHOS 72 66  BILITOT 0.7 0.7  PROT 7.8 7.2  ALBUMIN 3.3* 3.3*    HbA1C: Hemoglobin A1C  Date/Time Value Ref Range Status  12/13/2017 12:00 AM 9.1  Final   Hgb A1c MFr Bld  Date/Time Value Ref Range Status  05/30/2019 10:20 PM 11.1 (H)  4.8 - 5.6 % Final    Comment:    (NOTE) Pre diabetes:          5.7%-6.4% Diabetes:              >6.4% Glycemic control for   <7.0% adults with diabetes   10/25/2018 09:22 AM 10.8 (H) 4.6 - 6.5 % Final    Comment:    Glycemic Control Guidelines for People with Diabetes:Non Diabetic:  <6%Goal of Therapy: <7%Additional Action Suggested:  >8%     CBG: Recent Labs  Lab 06/09/19 0737 06/09/19 1213 06/09/19 1607 06/09/19 2208 06/10/19 0738  GLUCAP 114* 200* 301* 292* 131*    Recent Results (from the past 240 hour(s))  MRSA PCR  Screening     Status: None   Collection Time: 05/31/19  3:41 PM   Specimen: Nasopharyngeal  Result Value Ref Range Status   MRSA by PCR NEGATIVE NEGATIVE Final    Comment:        The GeneXpert MRSA Assay (FDA approved for NASAL specimens only), is one component of a comprehensive MRSA colonization surveillance program. It is not intended to diagnose MRSA infection nor to guide or monitor treatment for MRSA infections. Performed at [facility], [address], [place], [place] [address]      Scheduled Meds: . apixaban  5 mg Oral BID  . atorvastatin  40 mg Oral QPM  . digoxin  0.125 mg Oral Daily  . feeding supplement (ENSURE ENLIVE)  237 mL Oral BID BM  . insulin aspart  0-20 Units Subcutaneous TID WC  . insulin aspart  0-5 Units Subcutaneous QHS  . insulin aspart  3 Units Subcutaneous TID WC  . insulin detemir  25 Units Subcutaneous QHS  . insulin detemir  32 Units Subcutaneous Daily  . lactulose  30 g Oral Daily  . multivitamin with minerals  1 tablet Oral Daily  . thiamine  100 mg Oral QHS  . torsemide  80 mg Oral Daily     LOS: 11 days   Lonia Blood, MD [name] Office  [phone] Pager - Text Page per Amion  If 7PM-7AM, please contact night-coverage per Amion 06/10/2019, 10:06 AM

## 2019-06-10 NOTE — TOC Initial Note (Signed)
Transition of Care [facility]) - Initial/Assessment Note    Patient Details  Name: [name] MRN: [id number] Date of Birth: August 21, 1945  [facility] CM/SW Contact:    Alberteen Sam, [place] Phone Number: [phone] 06/10/2019, 11:53 AM  Clinical Narrative:                  CSW spoke with patient's daughter [name] regarding SNF recommendation, she reports being agreement with this discharge plan and [facility] as this facility is 5 minutes from their home. She gave CSW permission to fax referrals out and [facility] of [facility].   CSW following up with [facility] admissions for bed availability.  Expected Discharge Plan: Skilled Nursing Facility Barriers to Discharge: Continued Medical Work up, SNF Pending bed offer   Patient Goals and CMS Choice   CMS Medicare.gov Compare Post Acute Care list provided to:: Patient Represenative (must comment)([name] (daughter) [phone]) Choice offered to / list presented to : Adult Children  Expected Discharge Plan and Services Expected Discharge Plan: [place] Acute Care Choice: [place] Living arrangements for the past 2 months: Single Family Home                                      Prior Living Arrangements/Services Living arrangements for the past 2 months: Single Family Home Lives with:: Self Patient language and need for interpreter reviewed:: Yes Do you feel safe going back to the place where you live?: No   needs short term rehab  Need for Family Participation in Patient Care: Yes (Comment) Care giver support system in place?: Yes (comment)   Criminal Activity/Legal Involvement Pertinent to Current Situation/Hospitalization: No - Comment as needed  Activities of Daily Living Home Assistive Devices/Equipment: Eyeglasses ADL Screening (condition at time of admission) Patient's cognitive ability adequate to safely complete daily activities?:  No Is the patient deaf or have difficulty hearing?: Yes Does the patient have difficulty seeing, even when wearing glasses/contacts?: No Does the patient have difficulty concentrating, remembering, or making decisions?: Yes Patient able to express need for assistance with ADLs?: Yes Does the patient have difficulty dressing or bathing?: Yes Independently performs ADLs?: No Communication: Independent Dressing (OT): Needs assistance Is this a change from baseline?: Pre-admission baseline Grooming: Needs assistance Is this a change from baseline?: Pre-admission baseline Feeding: Independent Bathing: Needs assistance Is this a change from baseline?: Pre-admission baseline Toileting: Independent In/Out Bed: Needs assistance Is this a change from baseline?: Pre-admission baseline Walks in Home: Independent Does the patient have difficulty walking or climbing stairs?: No Weakness of Legs: None Weakness of Arms/Hands: None  Permission Sought/Granted Permission sought to share information with : Case Manager, Customer service manager, Family Supports Permission granted to share information with : Yes, Verbal Permission Granted  Share Information with NAME: [name]  Permission granted to share info w AGENCY: SNFs  Permission granted to share info w Relationship: daughter  Permission granted to share info w Contact Information: [phone]  Emotional Assessment Appearance:: Other (Comment Required(unable to assess - remote) Attitude/Demeanor/Rapport: Unable to Assess Affect (typically observed): Unable to Assess Orientation: : Oriented to Self, Oriented to Place Alcohol / Substance Use: Not Applicable Psych Involvement: No (comment)  Admission diagnosis:  Hypoxia [R09.02] Acute respiratory failure with hypoxia ([place]) [J96.01] COVID-19 [U07.1] Patient Active Problem List   Diagnosis Date Noted  .  Uncontrolled type 2 diabetes mellitus with stage 3 chronic kidney disease, with  long-term current use of insulin (HCC) 06/02/2019  . Acute respiratory failure due to COVID-19 (HCC) 05/30/2019  . Acute respiratory failure with hypoxia (HCC) 05/30/2019  . Uncontrolled type 2 diabetes mellitus with hyperglycemia (HCC) 03/04/2018  . Acute on chronic respiratory failure with hypercapnia (HCC)   . COPD with chronic bronchitis and emphysema (HCC)   . Acute encephalopathy   . Acute respiratory failure (HCC)   . Acute on chronic combined systolic (congestive) and diastolic (congestive) heart failure (HCC) 10/21/2017  . Chronic hypoxemic respiratory failure (HCC) 10/21/2017  . Bipolar disorder (HCC) 10/21/2017  . Chronic atrial fibrillation (HCC) 10/21/2017  . History of CVA (cerebrovascular accident) 10/21/2017  . COPD (chronic obstructive pulmonary disease) (HCC) 10/21/2017  . Tobacco abuse 10/21/2017  . History of alcohol use 10/21/2017  . Biventricular automatic implantable cardioverter defibrillator in situ 10/21/2017  . Dementia  10/21/2017  . History of mitral valve replacement 10/21/2017  . Coronary artery disease 10/21/2017  . Situational depression 10/21/2017  . Grief reaction 10/21/2017  . Chronic respiratory failure with hypoxia and hypercapnia (HCC) 06/22/2017  . CHF exacerbation (HCC) 06/06/2017  . Acute respiratory failure with hypoxia and hypercapnia (HCC)   . COPD GOLD 0 still smoking  03/02/2016  . Chronic kidney disease (CKD), stage II (mild) 11/23/2015  . CAD (coronary artery disease)   . Dementia (HCC)   . Chronic respiratory failure with hypoxia (HCC) 10/11/2015  . Chronic systolic CHF (congestive heart failure) (HCC) 05/30/2015  . Cigarette smoker 12/24/2014  . Stroke (HCC)   . Mitral valve disease   . Depression   . Mixed Ischemic and Nonischemic Cardiomyopathy   . Alcohol abuse   . Chronic anticoagulation 08/22/2014  . PVD (peripheral vascular disease) (HCC) 02/28/2014  . [place] thrombus of heart 02/07/2014  . Hypertension 06/14/2012  .  Automatic implantable cardioverter-defibrillator in situ 04/04/2012  . Hyperlipidemia 10/27/2011  . Ventricular tachycardia (HCC) 07/14/2011  . Atrial fibrillation, chronic (HCC) 07/13/2011  . Right atrial thrombus (HCC) 07/11/2011   PCP:  Jeoffrey Massed, MD Pharmacy:   [facility] [address], [place] - [address] [place] [address] Phone: [phone] Fax: [phone]  [facility] - [place], [place] - [address] [place] [address] Phone: [phone] Fax: [phone]     Social Determinants of Health (SDOH) Interventions    Readmission Risk Interventions No flowsheet data found.

## 2019-06-10 NOTE — Plan of Care (Signed)
Patient cnts to remain stable on RA with no complaints or concerns throughout shift. Cnts to wax and wane with orientation. Call light in reach, bed alarm in place.    Problem: Education: Goal: Ability to describe self-care measures that may prevent or decrease complications (Diabetes Survival Skills Education) will improve Outcome: Progressing   Problem: Coping: Goal: Ability to adjust to condition or change in health will improve Outcome: Progressing   Problem: Fluid Volume: Goal: Ability to maintain a balanced intake and output will improve Outcome: Progressing   Problem: Cardiac: Goal: Ability to achieve and maintain adequate cardiopulmonary perfusion will improve Outcome: Progressing   Problem: Education: Goal: Ability to describe self-care measures that may prevent or decrease complications (Diabetes Survival Skills Education) will improve Outcome: Progressing   Problem: Coping: Goal: Ability to adjust to condition or change in health will improve Outcome: Progressing   Problem: Fluid Volume: Goal: Ability to maintain a balanced intake and output will improve Outcome: Progressing   Problem: Health Behavior/Discharge Planning: Goal: Ability to identify and utilize available resources and services will improve Outcome: Progressing Goal: Ability to manage health-related needs will improve Outcome: Progressing   Problem: Metabolic: Goal: Ability to maintain appropriate glucose levels will improve Outcome: Progressing   Problem: Nutritional: Goal: Maintenance of adequate nutrition will improve Outcome: Progressing Goal: Progress toward achieving an optimal weight will improve Outcome: Progressing   Problem: Skin Integrity: Goal: Risk for impaired skin integrity will decrease Outcome: Progressing   Problem: Tissue Perfusion: Goal: Adequacy of tissue perfusion will improve Outcome: Progressing   Problem: Education: Goal: Knowledge of risk factors and measures  for prevention of condition will improve Outcome: Progressing   Problem: Coping: Goal: Psychosocial and spiritual needs will be supported Outcome: Progressing   Problem: Respiratory: Goal: Will maintain a patent airway Outcome: Progressing Goal: Complications related to the disease process, condition or treatment will be avoided or minimized Outcome: Progressing   Problem: Education: Goal: Ability to demonstrate management of disease process will improve Outcome: Progressing Goal: Ability to verbalize understanding of medication therapies will improve Outcome: Progressing   Problem: Activity: Goal: Capacity to carry out activities will improve Outcome: Progressing   Problem: Cardiac: Goal: Ability to achieve and maintain adequate cardiopulmonary perfusion will improve Outcome: Progressing

## 2019-06-10 NOTE — Plan of Care (Signed)
  Problem: Education: Goal: Ability to describe self-care measures that may prevent or decrease complications (Diabetes Survival Skills Education) will improve Outcome: Progressing   Problem: Coping: Goal: Ability to adjust to condition or change in health will improve Outcome: Progressing   Problem: Fluid Volume: Goal: Ability to maintain a balanced intake and output will improve Outcome: Progressing   Problem: Cardiac: Goal: Ability to achieve and maintain adequate cardiopulmonary perfusion will improve Outcome: Progressing   Problem: Education: Goal: Ability to describe self-care measures that may prevent or decrease complications (Diabetes Survival Skills Education) will improve Outcome: Progressing   Problem: Coping: Goal: Ability to adjust to condition or change in health will improve Outcome: Progressing   Problem: Fluid Volume: Goal: Ability to maintain a balanced intake and output will improve Outcome: Progressing   Problem: Health Behavior/Discharge Planning: Goal: Ability to identify and utilize available resources and services will improve Outcome: Progressing Goal: Ability to manage health-related needs will improve Outcome: Progressing   Problem: Metabolic: Goal: Ability to maintain appropriate glucose levels will improve Outcome: Progressing   Problem: Nutritional: Goal: Maintenance of adequate nutrition will improve Outcome: Progressing Goal: Progress toward achieving an optimal weight will improve Outcome: Progressing   Problem: Skin Integrity: Goal: Risk for impaired skin integrity will decrease Outcome: Progressing   Problem: Tissue Perfusion: Goal: Adequacy of tissue perfusion will improve Outcome: Progressing   Problem: Education: Goal: Knowledge of risk factors and measures for prevention of condition will improve Outcome: Progressing   Problem: Coping: Goal: Psychosocial and spiritual needs will be supported Outcome: Progressing    Problem: Respiratory: Goal: Will maintain a patent airway Outcome: Progressing Goal: Complications related to the disease process, condition or treatment will be avoided or minimized Outcome: Progressing   Problem: Education: Goal: Ability to demonstrate management of disease process will improve Outcome: Progressing Goal: Ability to verbalize understanding of medication therapies will improve Outcome: Progressing   Problem: Activity: Goal: Capacity to carry out activities will improve Outcome: Progressing   Problem: Cardiac: Goal: Ability to achieve and maintain adequate cardiopulmonary perfusion will improve Outcome: Progressing

## 2019-06-10 NOTE — Progress Notes (Signed)
Paged MD d/t BP and HR right @ parameter and patient has been dropping to 90's SBP after am dose in the pm the last few days. MD said he will d/c this for now and see how patient does without it and to hold this am dose

## 2019-06-11 LAB — GLUCOSE, CAPILLARY
Glucose-Capillary: 116 mg/dL — ABNORMAL HIGH (ref 70–99)
Glucose-Capillary: 324 mg/dL — ABNORMAL HIGH (ref 70–99)
Glucose-Capillary: 209 mg/dL — ABNORMAL HIGH (ref 70–99)
Glucose-Capillary: 190 mg/dL — ABNORMAL HIGH (ref 70–99)

## 2019-06-11 MED ORDER — GLUCERNA SHAKE PO LIQD
237.0000 mL | Freq: Three times a day (TID) | ORAL | Status: DC
  Administered 2019-06-11 – 2019-06-13 (×6): 237 mL via ORAL

## 2019-06-11 NOTE — Plan of Care (Signed)
  Problem: Education: Goal: Ability to describe self-care measures that may prevent or decrease complications (Diabetes Survival Skills Education) will improve Outcome: Progressing   Problem: Coping: Goal: Ability to adjust to condition or change in health will improve Outcome: Progressing   Problem: Fluid Volume: Goal: Ability to maintain a balanced intake and output will improve Outcome: Progressing   Problem: Cardiac: Goal: Ability to achieve and maintain adequate cardiopulmonary perfusion will improve Outcome: Progressing   Problem: Education: Goal: Ability to describe self-care measures that may prevent or decrease complications (Diabetes Survival Skills Education) will improve Outcome: Progressing   Problem: Coping: Goal: Ability to adjust to condition or change in health will improve Outcome: Progressing   Problem: Fluid Volume: Goal: Ability to maintain a balanced intake and output will improve Outcome: Progressing   Problem: Health Behavior/Discharge Planning: Goal: Ability to identify and utilize available resources and services will improve Outcome: Progressing Goal: Ability to manage health-related needs will improve Outcome: Progressing   Problem: Metabolic: Goal: Ability to maintain appropriate glucose levels will improve Outcome: Progressing   Problem: Nutritional: Goal: Maintenance of adequate nutrition will improve Outcome: Progressing Goal: Progress toward achieving an optimal weight will improve Outcome: Progressing   Problem: Skin Integrity: Goal: Risk for impaired skin integrity will decrease Outcome: Progressing   Problem: Tissue Perfusion: Goal: Adequacy of tissue perfusion will improve Outcome: Progressing   Problem: Education: Goal: Knowledge of risk factors and measures for prevention of condition will improve Outcome: Progressing   Problem: Coping: Goal: Psychosocial and spiritual needs will be supported Outcome: Progressing    Problem: Respiratory: Goal: Will maintain a patent airway Outcome: Progressing Goal: Complications related to the disease process, condition or treatment will be avoided or minimized Outcome: Progressing   Problem: Education: Goal: Ability to demonstrate management of disease process will improve Outcome: Progressing Goal: Ability to verbalize understanding of medication therapies will improve Outcome: Progressing   Problem: Activity: Goal: Capacity to carry out activities will improve Outcome: Progressing   Problem: Cardiac: Goal: Ability to achieve and maintain adequate cardiopulmonary perfusion will improve Outcome: Progressing   

## 2019-06-11 NOTE — Progress Notes (Signed)
[name]  [id number]:[id number] DOB: [birth year] DOA: 05/30/2019 PCP: Jeoffrey Massed, MD    Brief Narrative:  74 year old with a history of CAD, chronic systolic CHF, pacemaker/ICD, atrial fibrillation, CKD stage III, DM, dementia, and bioprosthetic mitral valve replacement who presented to the ED 12/22 with SOB.  The patient was seen in the emergency room 12/20 with the same complaints, diagnosed with Covid pneumonia, and was able to be sent home.  On his return visit he was much more hypoxic and required a nonrebreather.  CT of the head was without acute findings.  Significant Events: 12/20 ED evaluation - COVID Pneumonia 12/22 admit via [facility]  COVID-19 specific Treatment: Remdesivir 12/22 > 12/26 Actemra 12/23 Decadron 12/22 > 12/28  Subjective: Vital signs stable with saturations 93-96% on room air.  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 rate 14, height 5'  4" (1.626 m), weight 64.1 kg, SpO2 93 %.  Intake/Output Summary (Last 24 hours) at 06/11/2019 0936 Last data filed at 06/11/2019 0600 Gross per 24 hour  Intake 390 ml  Output --  Net 390 ml   Filed Weights   06/04/19 0500 06/09/19 0410 06/10/19 0423  Weight: 51.3 kg 64 kg 64.1 kg    Examination:   CBC: Recent Labs  Lab 06/10/19 0415  WBC 10.7*  HGB 12.8*  HCT 41.1  MCV 89.3  PLT 155   Basic Metabolic Panel: Recent Labs  Lab 06/06/19 0440 06/08/19 0552 06/10/19 0415  NA 136 136 139  K 4.4 4.3 4.2  CL 92* 96* 95*  CO2 29 29 31   GLUCOSE 133* 116* 119*  BUN 95* 82* 64*  CREATININE 1.42* 1.41* 1.34*  CALCIUM 8.7* 8.6* 9.0  MG  --   --  2.4   GFR: Estimated Creatinine Clearance: 42.3 mL/min (A) (by C-G formula based on SCr of 1.34 mg/dL (H)).  Liver Function Tests: Recent Labs  Lab 06/06/19 0440  AST 22  ALT 21  ALKPHOS 66  BILITOT 0.7  PROT 7.2  ALBUMIN 3.3*    HbA1C: Hemoglobin A1C  Date/Time Value Ref Range Status  12/13/2017 12:00 AM 9.1  Final   Hgb A1c MFr Bld  Date/Time Value Ref Range Status  05/30/2019 10:20 PM 11.1 (H) 4.8 - 5.6 % Final    Comment:    (NOTE) Pre diabetes:          5.7%-6.4% Diabetes:              >6.4% Glycemic control for   <  7.0% adults with diabetes   10/25/2018 09:22 AM 10.8 (H) 4.6 - 6.5 % Final    Comment:    Glycemic Control Guidelines for People with Diabetes:Non Diabetic:  <6%Goal of Therapy: <7%Additional Action Suggested:  >8%     CBG: Recent Labs  Lab 06/10/19 0738 06/10/19 1215 06/10/19 1612 06/10/19 2114 06/11/19 0751  GLUCAP 131* 200* 293* 172* 116*    No results found for this or any previous visit (from the past 240 hour(s)).   Scheduled Meds: . apixaban  5 mg Oral BID  . atorvastatin  40 mg Oral QPM  . digoxin  0.125 mg Oral Daily  . feeding supplement (ENSURE ENLIVE)  237 mL Oral BID BM  . insulin aspart  0-20 Units Subcutaneous TID WC  . insulin aspart  0-5 Units Subcutaneous QHS  .  insulin aspart  6 Units Subcutaneous TID WC  . insulin detemir  25 Units Subcutaneous QHS  . insulin detemir  36 Units Subcutaneous Daily  . lactulose  30 g Oral Daily  . multivitamin with minerals  1 tablet Oral Daily  . thiamine  100 mg Oral QHS  . torsemide  80 mg Oral Daily     LOS: 12 days   Cherene Altes, MD [name] Office  [phone] Pager - Text Page per Amion  If 7PM-7AM, please contact night-coverage per Amion 06/11/2019, 9:36 AM

## 2019-06-11 NOTE — Plan of Care (Signed)
Patient has been stable throughout shift. Accu checks still up and down with readings, but seems to be better controlled since MD changed sliding scale and scheduled insulin. Spoke with patient's daughter to update on status and POC and answered questions. Patient's daughter asked if Ensure could be changed to Glucerna to assist in better control with accu checks. Will discuss with MD. Call light in reach, bed alarm in place.    Problem: Education: Goal: Ability to describe self-care measures that may prevent or decrease complications (Diabetes Survival Skills Education) will improve Outcome: Progressing   Problem: Coping: Goal: Ability to adjust to condition or change in health will improve Outcome: Progressing   Problem: Fluid Volume: Goal: Ability to maintain a balanced intake and output will improve Outcome: Progressing   Problem: Cardiac: Goal: Ability to achieve and maintain adequate cardiopulmonary perfusion will improve Outcome: Progressing   Problem: Education: Goal: Ability to describe self-care measures that may prevent or decrease complications (Diabetes Survival Skills Education) will improve Outcome: Progressing   Problem: Coping: Goal: Ability to adjust to condition or change in health will improve Outcome: Progressing   Problem: Fluid Volume: Goal: Ability to maintain a balanced intake and output will improve Outcome: Progressing   Problem: Health Behavior/Discharge Planning: Goal: Ability to identify and utilize available resources and services will improve Outcome: Progressing Goal: Ability to manage health-related needs will improve Outcome: Progressing   Problem: Metabolic: Goal: Ability to maintain appropriate glucose levels will improve Outcome: Progressing   Problem: Nutritional: Goal: Maintenance of adequate nutrition will improve Outcome: Progressing Goal: Progress toward achieving an optimal weight will improve Outcome: Progressing   Problem:  Skin Integrity: Goal: Risk for impaired skin integrity will decrease Outcome: Progressing   Problem: Tissue Perfusion: Goal: Adequacy of tissue perfusion will improve Outcome: Progressing   Problem: Education: Goal: Knowledge of risk factors and measures for prevention of condition will improve Outcome: Progressing   Problem: Coping: Goal: Psychosocial and spiritual needs will be supported Outcome: Progressing   Problem: Respiratory: Goal: Will maintain a patent airway Outcome: Progressing Goal: Complications related to the disease process, condition or treatment will be avoided or minimized Outcome: Progressing   Problem: Education: Goal: Ability to demonstrate management of disease process will improve Outcome: Progressing Goal: Ability to verbalize understanding of medication therapies will improve Outcome: Progressing   Problem: Activity: Goal: Capacity to carry out activities will improve Outcome: Progressing   Problem: Cardiac: Goal: Ability to achieve and maintain adequate cardiopulmonary perfusion will improve Outcome: Progressing

## 2019-06-12 LAB — GLUCOSE, CAPILLARY
Glucose-Capillary: 210 mg/dL — ABNORMAL HIGH (ref 70–99)
Glucose-Capillary: 280 mg/dL — ABNORMAL HIGH (ref 70–99)
Glucose-Capillary: 176 mg/dL — ABNORMAL HIGH (ref 70–99)
Glucose-Capillary: 233 mg/dL — ABNORMAL HIGH (ref 70–99)

## 2019-06-12 NOTE — Progress Notes (Signed)
[name]  DTO:[id number] DOB: Oct 06, 1945 DOA: 05/30/2019 PCP: Tammi Sou, MD    Brief Narrative:  74 year old with a history of CAD, chronic systolic CHF, pacemaker/ICD, atrial fibrillation, CKD stage III, DM, dementia, and bioprosthetic mitral valve replacement who presented to the ED 12/22 with SOB.  The patient was seen in the emergency room 12/20 with the same complaints, diagnosed with Covid pneumonia, and was able to be sent home.  On his return visit he was much more hypoxic and required a nonrebreather.  CT of the head was without acute findings.  Significant Events: 12/20 ED evaluation - COVID Pneumonia 12/22 admit via [place]  COVID-19 specific Treatment: Remdesivir 12/22 > 12/26 Actemra 12/23 Decadron 12/22 > 12/28  Subjective: In good spirits.  Quite pleasant.  Confused but not agitated at all.  Appears comfortable.  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 rate 14, height 5\' 4"  (1.626 m), weight 64.1 kg, SpO2 94 %.  Intake/Output Summary (Last 24 hours) at 06/12/2019 1014 Last data filed at 06/12/2019 0600 Gross per 24 hour  Intake 450 ml  Output --  Net 450 ml   Filed Weights   06/04/19 0500 06/09/19 0410 06/10/19 0423  Weight: 51.3 kg 64 kg 64.1 kg    Examination: Lungs: CTA B - no wheezing  CV: RRR   CBC: Recent Labs  Lab 06/10/19 0415  WBC 10.7*  HGB 12.8*  HCT 41.1  MCV 89.3  PLT 983   Basic Metabolic Panel: Recent Labs  Lab 06/06/19 0440 06/08/19 0552 06/10/19 0415  NA 136 136 139  K 4.4 4.3 4.2  CL 92* 96* 95*  CO2 29 29 31   GLUCOSE 133* 116* 119*  BUN 95* 82* 64*  CREATININE 1.42* 1.41* 1.34*  CALCIUM 8.7* 8.6* 9.0  MG  --   --  2.4   GFR: Estimated Creatinine Clearance: 42.3 mL/min (A) (by C-G formula based on SCr of 1.34 mg/dL (H)).  Liver Function Tests: Recent Labs  Lab 06/06/19 0440  AST 22  ALT 21  ALKPHOS 66  BILITOT 0.7  PROT 7.2  ALBUMIN 3.3*    HbA1C: Hemoglobin A1C  Date/Time Value Ref Range Status  12/13/2017 12:00 AM 9.1  Final   Hgb A1c MFr Bld  Date/Time Value Ref Range Status  05/30/2019 10:20 PM 11.1 (H) 4.8 - 5.6 % Final    Comment:    (NOTE) Pre diabetes:  5.7%-6.4% Diabetes:              >6.4% Glycemic control for   <7.0% adults with diabetes   10/25/2018 09:22 AM 10.8 (H) 4.6 - 6.5 % Final    Comment:    Glycemic Control Guidelines for People with Diabetes:Non Diabetic:  <6%Goal of Therapy: <7%Additional Action Suggested:  >8%     CBG: Recent Labs  Lab 06/11/19 0751 06/11/19 1153 06/11/19 1639 06/11/19 2034 06/12/19 0852  GLUCAP 116* 324* 209* 190* 210*    Scheduled Meds: . apixaban  5 mg Oral BID  . atorvastatin  40 mg Oral QPM  . digoxin  0.125 mg Oral Daily  . feeding supplement (GLUCERNA SHAKE)  237 mL Oral TID BM  . insulin aspart  0-20 Units Subcutaneous TID WC  . insulin  aspart  0-5 Units Subcutaneous QHS  . insulin aspart  6 Units Subcutaneous TID WC  . insulin detemir  25 Units Subcutaneous QHS  . insulin detemir  36 Units Subcutaneous Daily  . lactulose  30 g Oral Daily  . multivitamin with minerals  1 tablet Oral Daily  . thiamine  100 mg Oral QHS  . torsemide  80 mg Oral Daily     LOS: 13 days   Lonia Blood, MD [name] Office  [phone] Pager - Text Page per Amion  If 7PM-7AM, please contact night-coverage per Amion 06/12/2019, 10:14 AM

## 2019-06-12 NOTE — Progress Notes (Signed)
Physical Therapy Treatment Patient Details Name: [name] MRN: [id number] DOB: [birth year] Today's Date: 06/12/2019    History of Present Illness 74 year old male admitted on 05/30/2019 with history of worsening generalized weakness and increased dementia, acute respiratory failure to to COVID.    PT Comments    The patient is improving in mobility. Requires multimodal cues  For initiation and to stay on task. Continue PT.   Follow Up Recommendations  SNF     Equipment Recommendations  None recommended by PT    Recommendations for Other Services       Precautions / Restrictions Precautions Precautions: Fall    Mobility  Bed Mobility         Supine to sit: Supervision Sit to supine: Supervision   General bed mobility comments: Requires verbal and  repeated cues to follow through-  Transfers Overall transfer level: Needs assistance Equipment used: Rolling walker (2 wheeled) Transfers: Sit to/from Stand Sit to Stand: Min guard         General transfer comment: close guard for safety  Ambulation/Gait Ambulation/Gait assistance: Min assist Gait Distance (Feet): 90 Feet(x 2) Assistive device: Rolling walker (2 wheeled) Gait Pattern/deviations: Step-through pattern;Decreased stride length;Drifts right/left;Trunk flexed Gait velocity: decr   General Gait Details: steady assist for turns and around Optician, dispensing    Modified Rankin (Stroke Patients Only)       Balance Overall balance assessment: Mild deficits observed, not formally tested                                          Cognition Arousal/Alertness: Awake/alert Behavior During Therapy: Flat affect Overall Cognitive Status: History of cognitive impairments - at baseline                                 General Comments: followed instructions , requiring several repeated times      Exercises      General  Comments        Pertinent Vitals/Pain Pain Assessment: No/denies pain    Home Living                      Prior Function            PT Goals (current goals can now be found in the care plan section)      Frequency    Min 2X/week      PT Plan Current plan remains appropriate    Co-evaluation              AM-PAC PT "6 Clicks" Mobility   Outcome Measure  Help needed turning from your back to your side while in a flat bed without using bedrails?: A Little Help needed moving from lying on your back to sitting on the side of a flat bed without using bedrails?: A Little Help needed moving to and from a bed to a chair (including a wheelchair)?: A Little Help needed standing up from a chair using your arms (e.g., wheelchair or bedside chair)?: A Little Help needed to walk in hospital room?: A Little Help needed climbing 3-5 steps with a railing? : A Lot 6 Click Score: 17    End of Session  Activity Tolerance: Patient tolerated treatment well Patient left: in bed;with call bell/phone within reach;with bed alarm set Nurse Communication: Mobility status PT Visit Diagnosis: Muscle weakness (generalized) (M62.81);Unsteadiness on feet (R26.81)     Time: 4562-5638 PT Time Calculation (min) (ACUTE ONLY): 14 min  Charges:  $Gait Training: 8-22 mins                     Blanchard Kelch PT Acute Rehabilitation Services Pager [phone] Office [phone]    Rada Hay 06/12/2019, 4:29 PM

## 2019-06-13 LAB — GLUCOSE, CAPILLARY
Glucose-Capillary: 139 mg/dL — ABNORMAL HIGH (ref 70–99)
Glucose-Capillary: 195 mg/dL — ABNORMAL HIGH (ref 70–99)
Glucose-Capillary: 224 mg/dL — ABNORMAL HIGH (ref 70–99)
Glucose-Capillary: 275 mg/dL — ABNORMAL HIGH (ref 70–99)

## 2019-06-13 MED ORDER — ACETAMINOPHEN 325 MG PO TABS: 650.0000 mg | ORAL_TABLET | Freq: Four times a day (QID) | ORAL | Status: DC | PRN

## 2019-06-13 MED ORDER — INSULIN ASPART 100 UNIT/ML ~~LOC~~ SOLN: 0.0000 [IU] | Freq: Three times a day (TID) | SUBCUTANEOUS | 11 refills | Status: DC

## 2019-06-13 MED ORDER — INSULIN DETEMIR 100 UNIT/ML ~~LOC~~ SOLN: 36.0000 [IU] | Freq: Every day | SUBCUTANEOUS | 11 refills | Status: DC

## 2019-06-13 MED ORDER — INSULIN ASPART 100 UNIT/ML ~~LOC~~ SOLN: 0.0000 [IU] | Freq: Every day | SUBCUTANEOUS | 11 refills | Status: DC

## 2019-06-13 MED ORDER — INSULIN DETEMIR 100 UNIT/ML ~~LOC~~ SOLN: 25.0000 [IU] | Freq: Every day | SUBCUTANEOUS | 11 refills | Status: DC

## 2019-06-13 MED ORDER — INSULIN ASPART 100 UNIT/ML ~~LOC~~ SOLN: 6.0000 [IU] | Freq: Three times a day (TID) | SUBCUTANEOUS | 11 refills | Status: DC

## 2019-06-13 NOTE — Plan of Care (Signed)
  Problem: Skin Integrity: Goal: Risk for impaired skin integrity will decrease Outcome: Progressing   Problem: Education: Goal: Ability to describe self-care measures that may prevent or decrease complications (Diabetes Survival Skills Education) will improve Outcome: Not Progressing   Problem: Nutritional: Goal: Maintenance of adequate nutrition will improve Outcome: Adequate for Discharge   Problem: Respiratory: Goal: Will maintain a patent airway Outcome: Adequate for Discharge   Problem: Activity: Goal: Capacity to carry out activities will improve Outcome: Adequate for Discharge

## 2019-06-13 NOTE — Progress Notes (Signed)
Pt's daughter [name] provided updates, plan of SNF for continued therapy, no further questions at this time. Please continue to provide updates in regards to disposition.

## 2019-06-13 NOTE — Discharge Summary (Signed)
DISCHARGE SUMMARY  [name]  MR#: [id number]  DOB:23-Oct-1945  Date of Admission: 05/30/2019 Date of Discharge: 06/14/2019  Attending Physician:Tranquilino Fischler Hennie Duos, MD  Patient's IPJ:[id number], Adrian Blackwater, MD  Consults: none  Disposition: D/C to SNF for rehab   Date of Positive COVID Test: 05/26/2019  Date Quarantine Ends: 06/16/2019  COVID-19 specific Treatment: Remdesivir 12/22 > 12/26 Actemra 12/23 Decadron 12/22 > 12/28  Follow-up Appts: Contact information for after-discharge care    Destination    [facility] .   Service: Skilled Nursing Contact information: [place] [address] [phone]              Tests Needing Follow-up: -monitor CBG closely - titrate DM meds prn  -assess renal function and electrolytes in 3 days in pt on diuretic   Discharge Diagnoses: COVID Pneumonia Acute hypoxic respiratory failure Uncontrolled DM2 with severe refractory hyperglycemia Chronic systolic CHF Chronic atrial fibrillation with right atrial thrombus PVD Dementia with acute delirium CAD AKI on CKD stage III  Initial presentation: 74 year old with a history of CAD, chronic systolic CHF, pacemaker/ICD, atrial fibrillation, CKD stage III, DM, dementia, and bioprosthetic mitral valve replacement who presented to the ED 12/22 with SOB.  The patient was seen in the emergency room 12/20 with the same complaints, diagnosed with Covid pneumonia, and was able to be sent home.  On his return visit he was much more hypoxic and required a nonrebreather.  CT of the head was without acute findings.  Hospital Course:  COVID Pneumonia - acute hypoxic respiratory failure Has completed a course of remdesivir, and Decadron treatment was discontinued 12/28 - saturations stable on room air   Uncontrolled DM2 with severe refractory hyperglycemia Steroids discontinued - CBGs remain somewhat variable but not severely elevated - will  need to cont to monitor and adjust med tx at his facility   Chronic systolic CHF Continue maintenance diuretic - no gross volume overload on exam   Chronic atrial fibrillation with right atrial thrombus Continue home Eliquis - rate controlled  PVD  Dementia with acute delirium Acute delirium appears to be slowly improving - alert and pleasant  CAD  AKI on CKD stage III crt stable at time of d/c   Allergies as of 06/13/2019      Reactions   Warfarin Other (See Comments)   Non compliance and ETOH abuse      Medication List    STOP taking these medications   Accu-Chek Guide test strip Generic drug: glucose blood   blood glucose meter kit and supplies   carvedilol 6.25 MG tablet Commonly known as: COREG   glipiZIDE 2.5 MG 24 hr tablet Commonly known as: GLUCOTROL XL   Insulin Pen Needle 31G X 6 MM Misc   Jardiance 10 MG Tabs tablet Generic drug: empagliflozin   loratadine 10 MG tablet Commonly known as: CLARITIN   losartan 25 MG tablet Commonly known as: COZAAR   OXYGEN   spironolactone 25 MG tablet Commonly known as: ALDACTONE     TAKE these medications   acetaminophen 325 MG tablet Commonly known as: TYLENOL Take 2 tablets (650 mg total) by mouth every 6 (six) hours as needed for mild pain (or Fever >/= 101).   arformoterol 15 MCG/2ML Nebu Commonly known as: BROVANA Take 15 mcg by nebulization 2 (two) times daily.   atorvastatin 40 MG tablet Commonly known as: LIPITOR Take 1 tablet (40 mg total) by mouth daily at 6 PM. What changed: when to  take this   budesonide 0.5 MG/2ML nebulizer solution Commonly known as: PULMICORT Take 2 mLs (0.5 mg total) by nebulization 2 (two) times daily. DX: J44.9   digoxin 0.125 MG tablet Commonly known as: LANOXIN Take 1 tablet (0.125 mg total) by mouth daily.   Eliquis 5 MG Tabs tablet Generic drug: apixaban Take 1 tablet by mouth twice daily.   insulin aspart 100 UNIT/ML injection Commonly known as:  novoLOG Inject 0-5 Units into the skin at bedtime.   insulin aspart 100 UNIT/ML injection Commonly known as: novoLOG Inject 0-20 Units into the skin 3 (three) times daily with meals. Start taking on: June 14, 2019   insulin aspart 100 UNIT/ML injection Commonly known as: novoLOG Inject 6 Units into the skin 3 (three) times daily with meals. Start taking on: June 14, 2019   insulin detemir 100 UNIT/ML injection Commonly known as: LEVEMIR Inject 0.25 mLs (25 Units total) into the skin at bedtime.   insulin detemir 100 UNIT/ML injection Commonly known as: LEVEMIR Inject 0.36 mLs (36 Units total) into the skin daily. Start taking on: June 14, 2019   lactulose 10 GM/15ML solution Commonly known as: CHRONULAC TAKE 30 MILLILITERS BY MOUTH TWICE A DAY What changed:   how much to take  how to take this  when to take this  additional instructions   multivitamin with minerals Tabs tablet Take 1 tablet by mouth daily. What changed: when to take this   nitroGLYCERIN 0.4 MG SL tablet Commonly known as: NITROSTAT Place 1 tablet (0.4 mg total) under the tongue every 5 (five) minutes as needed for chest pain.   thiamine 100 MG tablet Take 1 tablet (100 mg total) by mouth daily. What changed: when to take this   torsemide 20 MG tablet Commonly known as: DEMADEX Take 4 tablets (80 mg total) by mouth daily.       Day of Discharge BP 118/63 Comment: taken by Delynn Flavin, RN  Pulse (!) 59 Comment: taken by Delynn Flavin, RN  Temp 97.7 F (36.5 C) Comment: taken by Delynn Flavin, RN  Resp 18 Comment: Taken by Delynn Flavin, RN  Ht _0  (1.626 m)   Wt 64.1 kg   SpO2 93% Comment: taken by Delynn Flavin, RN  BMI 24.26 kg/m   Physical Exam: General: No acute respiratory distress Lungs: Clear to auscultation bilaterally without wheezes or crackles Cardiovascular: Regular rate without murmur gallop or rub normal S1 and S2 Abdomen: Nontender, nondistended, soft,  bowel sounds positive, no rebound, no ascites, no appreciable mass Extremities: No significant cyanosis, clubbing, or edema bilateral lower extremities  Basic Metabolic Panel: Recent Labs  Lab 06/08/19 0552 06/10/19 0415  NA 136 139  K 4.3 4.2  CL 96* 95*  CO2 29 31  GLUCOSE 116* 119*  BUN 82* 64*  CREATININE 1.41* 1.34*  CALCIUM 8.6* 9.0  MG  --  2.4    CBC: Recent Labs  Lab 06/10/19 0415  WBC 10.7*  HGB 12.8*  HCT 41.1  MCV 89.3  PLT 155    BNP (last 3 results) Recent Labs    05/26/19 1430 05/30/19 0147  BNP 188.8* 229.2*    CBG: Recent Labs  Lab 06/12/19 1748 06/12/19 2100 06/13/19 0726 06/13/19 1244 06/13/19 1740  GLUCAP 176* 233* 139* 195* 224*     Time spent in discharge (includes decision making & examination of pt): 35 minutes  06/13/2019, 6:29 PM   Cherene Altes, MD [name] Office  [phone]

## 2019-06-13 NOTE — Discharge Instructions (Signed)
Date of Positive COVID Test: 05/26/2019  Date Quarantine Ends: 06/16/2019    Information on my medicine - ELIQUIS (apixaban)  This medication education was reviewed with me or my healthcare representative as part of my discharge preparation.  The pharmacist that spoke with me during my hospital stay was:  Ulyses Southward, RPH-CPP  Why was Eliquis prescribed for you? Eliquis was prescribed for you to reduce the risk of a blood clot forming that can cause a stroke if you have a medical condition called atrial fibrillation (a type of irregular heartbeat).  What do You need to know about Eliquis ? Take your Eliquis TWICE DAILY - one tablet in the morning and one tablet in the evening with or without food. If you have difficulty swallowing the tablet whole please discuss with your pharmacist how to take the medication safely.  Take Eliquis exactly as prescribed by your doctor and DO NOT stop taking Eliquis without talking to the doctor who prescribed the medication.  Stopping may increase your risk of developing a stroke.  Refill your prescription before you run out.  After discharge, you should have regular check-up appointments with your healthcare provider that is prescribing your Eliquis.  In the future your dose may need to be changed if your kidney function or weight changes by a significant amount or as you get older.  What do you do if you miss a dose? If you miss a dose, take it as soon as you remember on the same day and resume taking twice daily.  Do not take more than one dose of ELIQUIS at the same time to make up a missed dose.  Important Safety Information A possible side effect of Eliquis is bleeding. You should call your healthcare provider right away if you experience any of the following: ? Bleeding from an injury or your nose that does not stop. ? Unusual colored urine (red or dark brown) or unusual colored stools (red or black). ? Unusual bruising for unknown  reasons. ? A serious fall or if you hit your head (even if there is no bleeding).  Some medicines may interact with Eliquis and might increase your risk of bleeding or clotting while on Eliquis. To help avoid this, consult your healthcare provider or pharmacist prior to using any new prescription or non-prescription medications, including herbals, vitamins, non-steroidal anti-inflammatory drugs (NSAIDs) and supplements.  This website has more information on Eliquis (apixaban): http://www.eliquis.com/eliquis/home

## 2019-06-13 NOTE — TOC Progression Note (Signed)
Transition of Care [facility]) - Progression Note    Patient Details  Name: SHAMMOND ARAVE MRN: [id number] Date of Birth: 1945-11-26  Transition of [facility]) CM/SW Contact  Armanda Heritage, RN Phone Number: 06/13/2019, 2:28 PM  Clinical Narrative:    CM spoke with patient's daughter regarding SNF placement. Patient's first choice ([facility]) is not accepting patient's at this time. [facility] was presented with bed offers from other facilities. Daughter selects [facility].  CM spoke with [facility] rep who reports they will have a bed available tomorrow 06/14/19.  [employer] medicare has an Production manager in place and Berkley Harvey will not need to be obtained prior to dc to facility, facility rep states they will initiate auth at the facility.    Expected Discharge Plan: Skilled Nursing Facility Barriers to Discharge: Continued Medical Work up, SNF Pending bed offer  Expected Discharge Plan and Services Expected Discharge Plan: Skilled Nursing Facility     Post Acute Care Choice: Skilled Nursing Facility Living arrangements for the past 2 months: Single Family Home                                       Social Determinants of Health (SDOH) Interventions    Readmission Risk Interventions No flowsheet data found.

## 2019-06-14 DIAGNOSIS — M6281 Muscle weakness (generalized): Secondary | ICD-10-CM | POA: Diagnosis not present

## 2019-06-14 DIAGNOSIS — M2550 Pain in unspecified joint: Secondary | ICD-10-CM | POA: Diagnosis not present

## 2019-06-14 DIAGNOSIS — I5043 Acute on chronic combined systolic (congestive) and diastolic (congestive) heart failure: Secondary | ICD-10-CM | POA: Diagnosis not present

## 2019-06-14 DIAGNOSIS — J9690 Respiratory failure, unspecified, unspecified whether with hypoxia or hypercapnia: Secondary | ICD-10-CM | POA: Diagnosis not present

## 2019-06-14 DIAGNOSIS — Z743 Need for continuous supervision: Secondary | ICD-10-CM | POA: Diagnosis not present

## 2019-06-14 DIAGNOSIS — I7389 Other specified peripheral vascular diseases: Secondary | ICD-10-CM | POA: Diagnosis not present

## 2019-06-14 DIAGNOSIS — E1165 Type 2 diabetes mellitus with hyperglycemia: Secondary | ICD-10-CM | POA: Diagnosis not present

## 2019-06-14 DIAGNOSIS — I5022 Chronic systolic (congestive) heart failure: Secondary | ICD-10-CM | POA: Diagnosis not present

## 2019-06-14 DIAGNOSIS — Z7401 Bed confinement status: Secondary | ICD-10-CM | POA: Diagnosis not present

## 2019-06-14 DIAGNOSIS — J9611 Chronic respiratory failure with hypoxia: Secondary | ICD-10-CM | POA: Diagnosis not present

## 2019-06-14 DIAGNOSIS — J80 Acute respiratory distress syndrome: Secondary | ICD-10-CM | POA: Diagnosis not present

## 2019-06-14 DIAGNOSIS — U071 COVID-19: Secondary | ICD-10-CM | POA: Diagnosis not present

## 2019-06-14 DIAGNOSIS — N183 Chronic kidney disease, stage 3 unspecified: Secondary | ICD-10-CM | POA: Diagnosis not present

## 2019-06-14 DIAGNOSIS — S2239XA Fracture of one rib, unspecified side, initial encounter for closed fracture: Secondary | ICD-10-CM | POA: Diagnosis not present

## 2019-06-14 DIAGNOSIS — J9601 Acute respiratory failure with hypoxia: Secondary | ICD-10-CM | POA: Diagnosis not present

## 2019-06-14 DIAGNOSIS — N179 Acute kidney failure, unspecified: Secondary | ICD-10-CM | POA: Diagnosis not present

## 2019-06-14 DIAGNOSIS — E118 Type 2 diabetes mellitus with unspecified complications: Secondary | ICD-10-CM | POA: Diagnosis not present

## 2019-06-14 DIAGNOSIS — J449 Chronic obstructive pulmonary disease, unspecified: Secondary | ICD-10-CM | POA: Diagnosis not present

## 2019-06-14 DIAGNOSIS — I428 Other cardiomyopathies: Secondary | ICD-10-CM | POA: Diagnosis not present

## 2019-06-14 LAB — COMPREHENSIVE METABOLIC PANEL
ALT: 36 U/L (ref 0–44)
AST: 37 U/L (ref 15–41)
Albumin: 3.3 g/dL — ABNORMAL LOW (ref 3.5–5.0)
Alkaline Phosphatase: 63 U/L (ref 38–126)
Anion gap: 13 (ref 5–15)
BUN: 50 mg/dL — ABNORMAL HIGH (ref 8–23)
CO2: 30 mmol/L (ref 22–32)
Calcium: 9.2 mg/dL (ref 8.9–10.3)
Chloride: 94 mmol/L — ABNORMAL LOW (ref 98–111)
Creatinine, Ser: 1.54 mg/dL — ABNORMAL HIGH (ref 0.61–1.24)
GFR calc Af Amer: 52 mL/min — ABNORMAL LOW (ref >60)
GFR calc non Af Amer: 45 mL/min — ABNORMAL LOW (ref >60)
Glucose, Bld: 302 mg/dL — ABNORMAL HIGH (ref 70–99)
Potassium: 4.5 mmol/L (ref 3.5–5.1)
Sodium: 137 mmol/L (ref 135–145)
Total Bilirubin: 0.6 mg/dL (ref 0.3–1.2)
Total Protein: 6.3 g/dL — ABNORMAL LOW (ref 6.5–8.1)

## 2019-06-14 LAB — GLUCOSE, CAPILLARY
Glucose-Capillary: 154 mg/dL — ABNORMAL HIGH (ref 70–99)
Glucose-Capillary: 254 mg/dL — ABNORMAL HIGH (ref 70–99)

## 2019-06-14 NOTE — Care Management Important Message (Signed)
Important Message  Patient Details  Name: [name] MRN: [id number] Date of Birth: 1946/05/12   Medicare Important Message Given:  Yes - Important Message mailed due to current National Emergency  Verbal consent obtained due to current National Emergency    Contact Name: [name] Call Date: 06/14/19  Time: 1401 Phone: [phone] Outcome: Spoke with contact Important Message mailed to: Patient address on file    Orson Aloe 06/14/2019, 2:01 PM

## 2019-06-14 NOTE — Progress Notes (Signed)
PTAR scheduled for 3PM- RN provided number for report

## 2019-06-14 NOTE — Progress Notes (Signed)
Patient seen and interviewed this morning. He was discharged by previous MD after 2 week inpatient stay having completed treatment for covid-19 pneumonia and reports no symptoms this morning. He agrees with plan to discharge to SNF and denies any recent subjective changes. Vital signs have remained stable without hypoxia. He remains stable for discharge per DC summary.   Hazeline Junker, MD 06/14/2019 9:43 AM

## 2019-06-14 NOTE — Progress Notes (Signed)
Patient left via PTAR in a stretcher for [facility]. Awake and alert. No distress noted. Belongings in stretcher with patient.

## 2019-06-14 NOTE — Progress Notes (Signed)
Report called to Insurance risk surveyor at [facility]. Removing PIV and waiting for transport. Patient belongings packed.

## 2019-06-14 NOTE — Plan of Care (Signed)
  Problem: Nutritional: Goal: Maintenance of adequate nutrition will improve Outcome: Progressing   Problem: Skin Integrity: Goal: Risk for impaired skin integrity will decrease Outcome: Progressing   Problem: Respiratory: Goal: Will maintain a patent airway Outcome: Progressing   Problem: Activity: Goal: Capacity to carry out activities will improve Outcome: Progressing   Problem: Education: Goal: Ability to describe self-care measures that may prevent or decrease complications (Diabetes Survival Skills Education) will improve Outcome: Not Progressing

## 2019-06-15 DIAGNOSIS — I7389 Other specified peripheral vascular diseases: Secondary | ICD-10-CM | POA: Diagnosis not present

## 2019-06-15 DIAGNOSIS — I428 Other cardiomyopathies: Secondary | ICD-10-CM | POA: Diagnosis not present

## 2019-06-15 DIAGNOSIS — U071 COVID-19: Secondary | ICD-10-CM | POA: Diagnosis not present

## 2019-06-15 DIAGNOSIS — E118 Type 2 diabetes mellitus with unspecified complications: Secondary | ICD-10-CM | POA: Diagnosis not present

## 2019-06-15 DIAGNOSIS — J9601 Acute respiratory failure with hypoxia: Secondary | ICD-10-CM | POA: Diagnosis not present

## 2019-06-18 DIAGNOSIS — J449 Chronic obstructive pulmonary disease, unspecified: Secondary | ICD-10-CM | POA: Diagnosis not present

## 2019-06-18 DIAGNOSIS — S2239XA Fracture of one rib, unspecified side, initial encounter for closed fracture: Secondary | ICD-10-CM | POA: Diagnosis not present

## 2019-06-18 DIAGNOSIS — I5043 Acute on chronic combined systolic (congestive) and diastolic (congestive) heart failure: Secondary | ICD-10-CM | POA: Diagnosis not present

## 2019-06-18 DIAGNOSIS — J9690 Respiratory failure, unspecified, unspecified whether with hypoxia or hypercapnia: Secondary | ICD-10-CM | POA: Diagnosis not present

## 2019-06-21 DIAGNOSIS — I428 Other cardiomyopathies: Secondary | ICD-10-CM | POA: Diagnosis not present

## 2019-06-21 DIAGNOSIS — U071 COVID-19: Secondary | ICD-10-CM | POA: Diagnosis not present

## 2019-06-21 DIAGNOSIS — E118 Type 2 diabetes mellitus with unspecified complications: Secondary | ICD-10-CM | POA: Diagnosis not present

## 2019-06-21 DIAGNOSIS — J9601 Acute respiratory failure with hypoxia: Secondary | ICD-10-CM | POA: Diagnosis not present

## 2019-06-21 DIAGNOSIS — I7389 Other specified peripheral vascular diseases: Secondary | ICD-10-CM | POA: Diagnosis not present

## 2019-06-22 ENCOUNTER — Telehealth: Payer: Self-pay

## 2019-06-22 NOTE — Telephone Encounter (Signed)
Pt's daughter is requesting Rx for shakes to be sent thru [facility]. She will call back after verifying with insurance for coverage.

## 2019-06-22 NOTE — Telephone Encounter (Signed)
LM for pt to returncall

## 2019-06-22 NOTE — Telephone Encounter (Signed)
Patient's daughter is requesting orders to be sent to [facility]  [facility] for Glucerna Shakes. Patient was given them at the hospital to help stabilize  his blood sugars.

## 2019-06-23 ENCOUNTER — Encounter: Payer: Self-pay | Admitting: Family Medicine

## 2019-06-23 ENCOUNTER — Other Ambulatory Visit: Payer: Self-pay

## 2019-06-23 ENCOUNTER — Ambulatory Visit (INDEPENDENT_AMBULATORY_CARE_PROVIDER_SITE_OTHER): Payer: Medicare Other | Admitting: Family Medicine

## 2019-06-23 VITALS — BP 110/61 | HR 59 | Wt 125.0 lb

## 2019-06-23 DIAGNOSIS — J9601 Acute respiratory failure with hypoxia: Secondary | ICD-10-CM | POA: Diagnosis not present

## 2019-06-23 DIAGNOSIS — I5022 Chronic systolic (congestive) heart failure: Secondary | ICD-10-CM

## 2019-06-23 DIAGNOSIS — J9611 Chronic respiratory failure with hypoxia: Secondary | ICD-10-CM | POA: Diagnosis not present

## 2019-06-23 DIAGNOSIS — N179 Acute kidney failure, unspecified: Secondary | ICD-10-CM

## 2019-06-23 DIAGNOSIS — N183 Chronic kidney disease, stage 3 unspecified: Secondary | ICD-10-CM

## 2019-06-23 DIAGNOSIS — E1165 Type 2 diabetes mellitus with hyperglycemia: Secondary | ICD-10-CM

## 2019-06-23 DIAGNOSIS — U071 COVID-19: Secondary | ICD-10-CM

## 2019-06-23 DIAGNOSIS — R5381 Other malaise: Secondary | ICD-10-CM

## 2019-06-23 DIAGNOSIS — I482 Chronic atrial fibrillation, unspecified: Secondary | ICD-10-CM

## 2019-06-23 DIAGNOSIS — Z7901 Long term (current) use of anticoagulants: Secondary | ICD-10-CM

## 2019-06-23 MED ORDER — INSULIN DETEMIR 100 UNIT/ML ~~LOC~~ SOLN: 36.0000 [IU] | Freq: Every day | SUBCUTANEOUS | 11 refills | Status: DC

## 2019-06-23 MED ORDER — INSULIN ASPART 100 UNIT/ML ~~LOC~~ SOLN: SUBCUTANEOUS | 11 refills | Status: DC

## 2019-06-23 MED ORDER — INSULIN PEN NEEDLE 31G X 8 MM MISC: 1 refills | Status: DC

## 2019-06-23 NOTE — Progress Notes (Signed)
Virtual Visit via Video Note  I connected with pt on 06/23/19 at  4:00 PM EST by a video enabled telemedicine application and verified that I am speaking with the correct person using two identifiers.  Location patient: home Location provider:work or home office Persons participating in the virtual visit: patient,pt's daughter and granddaughter, provider  I discussed the limitations of evaluation and management by telemedicine and the availability of in person appointments. The patient expressed understanding and agreed to proceed.  Telemedicine visit is a necessity given the COVID-19 restrictions in place at the current time.  HPI: 74 y/o WM being seen today for f/u recent hospitalization and rehab. Admitted 05/30/19 - 06/14/19-->d/c'd to SNF for rehab. Got d/c'd from rehab to home today.  Reason for admission was covid 19 pneumonia with acute hypoxic RF.  His DM was uncontrolled and hyperglycemia was refractrory.  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 He completed a course of remdesivir, got some decadron but this was discontinued due to refractory hyperglycemia.  Glucoses improved some off steroids but remained variable. A-fib was rate controlled and he continued eliquis. No probs with acute HF in hosp.   His sCr was stable at the time of d/c to SNF. D/C meds reviewed (d/c meds to SNF.  I have no records at all from SNF). He was not continued on carvedilol, spironolactone, glipizide, jardiance, or losartan.  Currently->History given almost exclusively by pt's daughter today and everything is in upheaval due to med confusion, specifically his insulin regimen. Pt got novolog at BF today at SNF and then was sent home and he has no novolog at home apparently, was on sliding scale (70-200, no units: 201-250 2U; 251-300, 4U; 301-350, 6U; 351-400, 8U; 401-450, 10U; 450-500, 12U). Glucoses at rehab "all over the place". Fastings 150s-300s avg, occ up  to 500 but rare lately.  Daughter reports that the instructions from SNF also said to give 55 U levemir qAM and 20 U lantus qhs-->which sounds odd.   Wt at home now is 145 lbs.   He is walking around some in the home wearing oxygen nasal cannulae, w/out walker.  He is getting low Na and low gluc diet at home starting now at his daughter's--just starting now. Plan is to limit fluids to 2L. We did not even get to discuss anything else about him today due to the confusion with his insulins.  ROS: no fevers, no n/v, no CP, no focal weakness, no diarrhea, no abd pain. Moderate level of memory/cognitive impairment which his daughter feels is at baseline.  Past Medical History:  Diagnosis Date  . AICD (automatic cardioverter/defibrillator) present   . Alcohol abuse   . Anxiety   . Bipolar disorder (HCC)   . Biventricular automatic implantable cardioverter defibrillator in situ    a. 01/2014 s/p MDT [id number] Viva XT CRT-D (ser # [id number]).  . CAD (coronary artery disease)    a. s/p prior PCI/stenting of the LCX;  b. 08/2014 MV: EF 20%, large septal, apical, and inferior infarct from apex to base, no ischemia;  c. 08/2014 NSTEMI/Cath: LM mod distal dzs extending into ostial LCX (80%), LAD tortuous, RI nl, OM mod dzs, RCA 100 CTO with R->R and L->R collats-->Med Rx.  . CHF (congestive heart failure) (HCC)   . Chronic atrial fibrillation (HCC)    a. CHA2DS2VASc = 6->eliquis;  b. S/P AVN RFCA an BiV ICD placement.  . Chronic renal insufficiency, stage 3 (moderate)  2020   GFR low 40s (estimated baseline sCr 1.5)  . Chronic respiratory failure (HCC)    hypoxic and hypercarbic  . Chronic systolic congestive heart failure (HCC)    a. 01/2014 Echo: EF 25-30%, mod conc LVH, mod dil LA.  Marland Kitchen Complete heart block (HCC)    a. In setting of prior AV nodal ablation r/t afib-->BiV ICD (01/2014).  . COPD (chronic obstructive pulmonary disease) (HCC)   . COVID-19 virus infection 05/26/2019  . CVA (cerebral vascular  accident) (HCC)    a. Multiple prior embolic strokes.  . Dementia (HCC)    suspect vascular  . Depression   . Diabetes mellitus (HCC)   . DVT (deep venous thrombosis) (HCC)   . Hepatic steatosis    w/out imaging evidence of cirrhosis on u/s or CT scan (as of 01/2019).  +hx of elevated ammonia.  . Hypertension   . Mitral valve disease    a. remote mitral replacement with Bjork Shiley valve;  b. 06/2006 Redo MVR with tissue valve.  . Mixed Ischemic and Nonischemic Cardiomyopathy    a. 8/.2015 Echo: Ef 25-30%;  b. 01/2014 s/p MDT [id number] Talbot Grumbling XT CRT-D (ser # [id number] H).  . On home oxygen therapy    "2L; 24/7" (65/16/2019)  . Psoriasis    "back of his head; elbows; finger of left hand" (06/07/2017)  . Pure hypercholesterolemia   . S/P AV nodal ablation   . Seborrheic dermatitis   . Tobacco dependence    current as of 07/2018  . Ventral hernia     Past Surgical History:  Procedure Laterality Date  . AV NODE ABLATION  2007  . CARDIAC CATHETERIZATION  06/2006   Mild ostial L main stenosis, CFX stent patent, RCA occluded (old)  . CORONARY ARTERY BYPASS GRAFT  2008  . CYSTOSCOPY W/ URETERAL STENT PLACEMENT  07/12/2011   Procedure: CYSTOSCOPY WITH RETROGRADE PYELOGRAM/URETERAL STENT PLACEMENT;  Surgeon: Sebastian Ache, MD;  Location: [facility] OR;  Service: Urology;  Laterality: Left;  . ECHO DOPPLER COMPLETE(TRANSTHOR) ([facility] HX)  04/07/2017   LV EF: 25%-30%  . EEG  2019 and 12/22/18   2019 abnl->no seizures, not on seizure meds (as of 01/2019).  Rpt EEG 12/2018 no epileptiform activity.  . EXPLORATORY LAPAROTOMY    . HERNIA REPAIR    . ICD GENERATOR CHANGE  02/01/2014   Gen change to: Medtronic VIVA pulse generator, serial number [id number]  . INSERT / REPLACE / REMOVE PACEMAKER    . LEFT HEART CATHETERIZATION WITH CORONARY ANGIOGRAM N/A 08/23/2014   Procedure: LEFT HEART CATHETERIZATION WITH CORONARY ANGIOGRAM;  Surgeon: Corky Crafts, MD;  Location: [facility] CATH LAB;  Service: Cardiovascular;   Laterality: N/A;  . MITRAL VALVE REPLACEMENT     remote [facility] valve 7026 with redo tissue valve 06/2006  . PACEMAKER GENERATOR CHANGE N/A 02/01/2014   Procedure: PACEMAKER GENERATOR CHANGE;  Surgeon: Duke Salvia, MD;  Location: [facility] CATH LAB;  Service: Cardiovascular;  Laterality: N/A;  . PACEMAKER PLACEMENT  2006   Changed to CRT-D in 2009  . RIGHT/LEFT HEART CATH AND CORONARY ANGIOGRAPHY N/A 06/09/2017   Procedure: RIGHT/LEFT HEART CATH AND CORONARY ANGIOGRAPHY;  Surgeon: [name], MD;  Location: [facility] INVASIVE CV LAB;  Service: Cardiovascular;  Laterality: N/A;  . TRANSTHORACIC ECHOCARDIOGRAM  10/2017   EF 30-35%, inf-lat and inf akin, MV bioprosth ok, mod dec RV fxn, pulm HTN (peak 65).  [name]      Family History  Problem Relation Age of Onset  .  Alzheimer's disease Mother   . CAD Mother   . Heart attack Father   . Heart disease Father   . CAD Father   . Heart attack Son   . Varicose Veins Son   . Deep vein thrombosis Son   . Heart disease Son   . Stroke Other   . Heart disease Other   . Heart disease Brother   . CAD Brother     SOCIAL HX:  Social History   Socioeconomic History  . Marital status: Divorced    Spouse name: Not on file  . Number of children: Not on file  . Years of education: Not on file  . Highest education level: Not on file  Occupational History  . Occupation: Retired Therapist, music  Tobacco Use  . Smoking status: Current Some Day Smoker    Packs/day: 2.00    Years: 45.00    Pack years: 90.00    Types: Cigarettes  . Smokeless tobacco: Never Used  . Tobacco comment: daughter states he cannot get to cigarettes now few cigarettes a day  Substance and Sexual Activity  . Alcohol use: Not Currently    Alcohol/week: 21.0 standard drinks    Types: 21 Cans of beer per week    Comment: 10/21/2017 "~ 2-3 beers/day"  . Drug use: No  . Sexual activity: Not Currently  Other Topics Concern  . Not on file  Social History Narrative   Pt is  divorced, has one daughter and one son.   Pt lives in 1 story home with his daughter and her family.   10th grade education.   Has lived in [place] all his life.   Worked in Publishing rights manager, retired.   Still smoking as of 07/2018.   Quit drinking around 2017.       Social Determinants of Health   Financial Resource Strain:   . Difficulty of Paying Living Expenses: Not on file  Food Insecurity:   . Worried About Programme researcher, broadcasting/film/video in the Last Year: Not on file  . Ran Out of Food in the Last Year: Not on file  Transportation Needs:   . Lack of Transportation (Medical): Not on file  . Lack of Transportation (Non-Medical): Not on file  Physical Activity:   . Days of Exercise per Week: Not on file  . Minutes of Exercise per Session: Not on file  Stress:   . Feeling of Stress : Not on file  Social Connections:   . Frequency of Communication with Friends and Family: Not on file  . Frequency of Social Gatherings with Friends and Family: Not on file  . Attends Religious Services: Not on file  . Active Member of Clubs or Organizations: Not on file  . Attends Banker Meetings: Not on file  . Marital Status: Not on file      Current Outpatient Medications:  .  acetaminophen (TYLENOL) 325 MG tablet, Take 2 tablets (650 mg total) by mouth every 6 (six) hours as needed for mild pain (or Fever >/= 101)., Disp:  , Rfl:  .  arformoterol (BROVANA) 15 MCG/2ML NEBU, Take 15 mcg by nebulization 2 (two) times daily., Disp: , Rfl:  .  atorvastatin (LIPITOR) 40 MG tablet, Take 1 tablet (40 mg total) by mouth daily at 6 PM. (Patient taking differently: Take 40 mg by mouth every evening. ), Disp: 90 tablet, Rfl: 2 .  budesonide (PULMICORT) 0.5 MG/2ML nebulizer solution, Take 2 mLs (0.5 mg total) by nebulization 2 (two)  times daily. DX: J44.9, Disp: 120 mL, Rfl: 4 .  digoxin (LANOXIN) 0.125 MG tablet, Take 1 tablet (0.125 mg total) by mouth daily., Disp: 90 tablet, Rfl: 2 .  ELIQUIS 5 MG TABS  tablet, Take 1 tablet by mouth twice daily., Disp: 180 tablet, Rfl: 2 .  lactulose (CHRONULAC) 10 GM/15ML solution, TAKE 30 MILLILITERS BY MOUTH TWICE A DAY (Patient taking differently: Take 30 g by mouth daily. ), Disp: 1800 mL, Rfl: 6 .  Multiple Vitamin (MULTIVITAMIN WITH MINERALS) TABS tablet, Take 1 tablet by mouth daily. (Patient taking differently: Take 1 tablet by mouth at bedtime. ), Disp: 30 tablet, Rfl: 2 .  thiamine 100 MG tablet, Take 1 tablet (100 mg total) by mouth daily. (Patient taking differently: Take 100 mg by mouth at bedtime. ), Disp: 30 tablet, Rfl: 0 .  torsemide (DEMADEX) 20 MG tablet, Take 4 tablets (80 mg total) by mouth daily., Disp: 120 tablet, Rfl: 4 .  insulin aspart (NOVOLOG) 100 UNIT/ML injection, Inject SQ with each meal according to sliding scale instructions, Disp: 10 mL, Rfl: 11 .  insulin detemir (LEVEMIR) 100 UNIT/ML injection, Inject 0.36 mLs (36 Units total) into the skin at bedtime., Disp: 10 mL, Rfl: 11 .  Insulin Pen Needle 31G X 8 MM MISC, Use to inject 36 U into skin once daily., Disp: 100 each, Rfl: 1 .  nitroGLYCERIN (NITROSTAT) 0.4 MG SL tablet, Place 1 tablet (0.4 mg total) under the tongue every 5 (five) minutes as needed for chest pain. (Patient not taking: Reported on 06/23/2019), Disp: 25 tablet, Rfl: 2  EXAM:  VITALS per patient if applicable: BP 644/03 (BP Location: Left Arm, Patient Position: Sitting, Cuff Size: Normal)   Pulse (!) 59   Wt 125 lb (56.7 kg)   BMI 21.46 kg/m    GENERAL: alert, oriented, appears well and in no acute distress  HEENT: atraumatic, conjunttiva clear, no obvious abnormalities on inspection of external nose and ears  NECK: normal movements of the head and neck  LUNGS: on inspection no signs of respiratory distress, breathing rate appears normal, no obvious gross SOB, gasping or wheezing  CV: no obvious cyanosis  MS: moves all visible extremities without noticeable abnormality  PSYCH/NEURO: pleasant and  cooperative, no obvious depression or anxiety, speech and thought processing grossly intact  LABS: none today  Lab Results  Component Value Date   TSH 2.13 01/19/2018   Lab Results  Component Value Date   WBC 10.7 (H) 06/10/2019   HGB 12.8 (L) 06/10/2019   HCT 41.1 06/10/2019   MCV 89.3 06/10/2019   PLT 155 06/10/2019   Lab Results  Component Value Date   CREATININE 1.54 (H) 06/14/2019   BUN 50 (H) 06/14/2019   NA 137 06/14/2019   K 4.5 06/14/2019   CL 94 (L) 06/14/2019   CO2 30 06/14/2019   Lab Results  Component Value Date   ALT 36 06/14/2019   AST 37 06/14/2019   ALKPHOS 63 06/14/2019   BILITOT 0.6 06/14/2019   Lab Results  Component Value Date   CHOL 140 11/12/2017   Lab Results  Component Value Date   HDL 37 (L) 11/12/2017   Lab Results  Component Value Date   LDLCALC 53 11/12/2017   Lab Results  Component Value Date   TRIG 242 (H) 05/30/2019   Lab Results  Component Value Date   CHOLHDL 3.8 11/12/2017   Lab Results  Component Value Date   HGBA1C 11.1 (H) 05/30/2019   ASSESSMENT  AND PLAN:  Discussed the following assessment and plan:  1) Covid 19 pneumonia, with acute on chronic RF: back to baseline resp status for the most part. I just need to clarify his oxygen use/needs when I speak with him/daughter again next week. He is using pulmicort inhaler now instead of neb.  2) CHF: no suspicion of volume overload at this time. Limiting fluids to 2L per day.  Currently taking 80mg  demadex qd. May consider slow/cautious restart of carvedilol/losartan/spironolactone as time goes on if conditions permit. BMET future.  3) A-fib: rate controlled with digoxin at this time, also on eliquis w/out signs of bleeding. CBC future, BMET future.  4) DM 2, poor control, brittle: Decided to stop lantus hs. Continue with just levemir and take 36 U qhs for now and we'll have to see how fasting glucoses go and adjust as necessary.  Daughter is good with  monitoring glucoses. We'll stick with the same sliding scale for his novolog for now and I sent in RF of this to fill now.  5) CRI III, with AKI in hosp: close monitoring with pt with HF on diuretics and fluid restriction. Creatinine/GFR baseline 1.5 and 45 respectively.  He has low muscle mass so I suspect his kidney function is worse than estimated by labs. BMET future.   6) Debilitated patient: according to the SNF d/c papers the daughter has today, HH has been ordered for him (PT/OT/Aid) and they are supposed to get called in the next 2 d or so.  I discussed the assessment and treatment plan with the patient. The patient was provided an opportunity to ask questions and all were answered. The patient agreed with the plan and demonstrated an understanding of the instructions.   Spent 35 min with pt today, with >50% of this time spent in counseling and care coordination regarding the above problems.  The patient was advised to call back or seek an in-person evaluation if the symptoms worsen or if the condition fails to improve as anticipated.  F/u: 5d telemed, labs at earliest convenience (CBC and BMET).  Signed:  Santiago Bumpers, MD           06/23/2019

## 2019-06-24 DIAGNOSIS — J9690 Respiratory failure, unspecified, unspecified whether with hypoxia or hypercapnia: Secondary | ICD-10-CM | POA: Diagnosis not present

## 2019-06-24 DIAGNOSIS — S2239XA Fracture of one rib, unspecified side, initial encounter for closed fracture: Secondary | ICD-10-CM | POA: Diagnosis not present

## 2019-06-24 DIAGNOSIS — J449 Chronic obstructive pulmonary disease, unspecified: Secondary | ICD-10-CM | POA: Diagnosis not present

## 2019-06-24 DIAGNOSIS — I5043 Acute on chronic combined systolic (congestive) and diastolic (congestive) heart failure: Secondary | ICD-10-CM | POA: Diagnosis not present

## 2019-06-24 NOTE — Progress Notes (Signed)
ICD remote 

## 2019-06-26 ENCOUNTER — Telehealth: Payer: Self-pay | Admitting: Family Medicine

## 2019-06-26 ENCOUNTER — Telehealth: Payer: Self-pay

## 2019-06-26 NOTE — Telephone Encounter (Signed)
Noted.  Great!

## 2019-06-26 NOTE — Telephone Encounter (Signed)
FYI  Please see below

## 2019-06-26 NOTE — Telephone Encounter (Signed)
Don't take spironolactone or carvedalol at this time but DO take Brovana and pulmicort.-thx

## 2019-06-26 NOTE — Telephone Encounter (Signed)
Patient's daughter, [name] had several questions regarding medications that are not on his updated med list given during rehab d/c. She is unsure if he should be taking carvedilol, spironolactone, Brovana or pulmicort. Pt's next appt is tomorrow.  Please advise, thanks.

## 2019-06-26 NOTE — Telephone Encounter (Signed)
[facility] will start seeing patient tomorrow.   No call back needed at this time, nurse was calling to make Dr. Milinda Cave aware.  [facility] phone # [phone].

## 2019-06-26 NOTE — Telephone Encounter (Signed)
Patient's daughter, [name] advised and voiced understanding.

## 2019-06-27 ENCOUNTER — Encounter: Payer: Self-pay | Admitting: Family Medicine

## 2019-06-27 ENCOUNTER — Other Ambulatory Visit: Payer: Self-pay

## 2019-06-27 ENCOUNTER — Ambulatory Visit (INDEPENDENT_AMBULATORY_CARE_PROVIDER_SITE_OTHER): Payer: Medicare Other | Admitting: Family Medicine

## 2019-06-27 VITALS — BP 122/52 | HR 59 | Temp 97.9°F

## 2019-06-27 DIAGNOSIS — R5381 Other malaise: Secondary | ICD-10-CM | POA: Diagnosis not present

## 2019-06-27 DIAGNOSIS — Z952 Presence of prosthetic heart valve: Secondary | ICD-10-CM | POA: Diagnosis not present

## 2019-06-27 DIAGNOSIS — J9611 Chronic respiratory failure with hypoxia: Secondary | ICD-10-CM

## 2019-06-27 DIAGNOSIS — I428 Other cardiomyopathies: Secondary | ICD-10-CM | POA: Diagnosis not present

## 2019-06-27 DIAGNOSIS — I482 Chronic atrial fibrillation, unspecified: Secondary | ICD-10-CM | POA: Diagnosis not present

## 2019-06-27 DIAGNOSIS — I130 Hypertensive heart and chronic kidney disease with heart failure and stage 1 through stage 4 chronic kidney disease, or unspecified chronic kidney disease: Secondary | ICD-10-CM | POA: Diagnosis not present

## 2019-06-27 DIAGNOSIS — I442 Atrioventricular block, complete: Secondary | ICD-10-CM | POA: Diagnosis not present

## 2019-06-27 DIAGNOSIS — I513 Intracardiac thrombosis, not elsewhere classified: Secondary | ICD-10-CM | POA: Diagnosis not present

## 2019-06-27 DIAGNOSIS — E1151 Type 2 diabetes mellitus with diabetic peripheral angiopathy without gangrene: Secondary | ICD-10-CM | POA: Diagnosis not present

## 2019-06-27 DIAGNOSIS — I5022 Chronic systolic (congestive) heart failure: Secondary | ICD-10-CM | POA: Diagnosis not present

## 2019-06-27 DIAGNOSIS — E118 Type 2 diabetes mellitus with unspecified complications: Secondary | ICD-10-CM

## 2019-06-27 DIAGNOSIS — E1165 Type 2 diabetes mellitus with hyperglycemia: Secondary | ICD-10-CM | POA: Diagnosis not present

## 2019-06-27 DIAGNOSIS — J9622 Acute and chronic respiratory failure with hypercapnia: Secondary | ICD-10-CM | POA: Diagnosis not present

## 2019-06-27 DIAGNOSIS — Z794 Long term (current) use of insulin: Secondary | ICD-10-CM | POA: Diagnosis not present

## 2019-06-27 DIAGNOSIS — E1122 Type 2 diabetes mellitus with diabetic chronic kidney disease: Secondary | ICD-10-CM | POA: Diagnosis not present

## 2019-06-27 DIAGNOSIS — Z95810 Presence of automatic (implantable) cardiac defibrillator: Secondary | ICD-10-CM | POA: Diagnosis not present

## 2019-06-27 DIAGNOSIS — I4891 Unspecified atrial fibrillation: Secondary | ICD-10-CM

## 2019-06-27 DIAGNOSIS — J9621 Acute and chronic respiratory failure with hypoxia: Secondary | ICD-10-CM | POA: Diagnosis not present

## 2019-06-27 DIAGNOSIS — F17210 Nicotine dependence, cigarettes, uncomplicated: Secondary | ICD-10-CM | POA: Diagnosis not present

## 2019-06-27 DIAGNOSIS — J440 Chronic obstructive pulmonary disease with acute lower respiratory infection: Secondary | ICD-10-CM | POA: Diagnosis not present

## 2019-06-27 DIAGNOSIS — N183 Chronic kidney disease, stage 3 unspecified: Secondary | ICD-10-CM

## 2019-06-27 DIAGNOSIS — Z7901 Long term (current) use of anticoagulants: Secondary | ICD-10-CM

## 2019-06-27 DIAGNOSIS — I2510 Atherosclerotic heart disease of native coronary artery without angina pectoris: Secondary | ICD-10-CM | POA: Diagnosis not present

## 2019-06-27 DIAGNOSIS — Z9981 Dependence on supplemental oxygen: Secondary | ICD-10-CM | POA: Diagnosis not present

## 2019-06-27 MED ORDER — DEXCOM G6 SENSOR MISC: 1.0000 | Freq: Four times a day (QID) | 1 refills | Status: DC

## 2019-06-27 NOTE — Progress Notes (Signed)
Virtual Visit via Video Note  I connected with [name] on 06/27/19 at 11:30 AM EST by telephone (excessive technical difficulties with video enabled telemedicine application) and verified that I am speaking with the correct person using two identifiers.  Location patient: home Location provider:work or home office Persons participating in the virtual visit: patient, [name]'s Dominic [name], provider.  I discussed the limitations of evaluation and management by telemedicine and the availability of in person appointments. The patient expressed understanding and agreed to proceed.  Telemedicine visit is a necessity given the COVID-19 restrictions in place at the current time.  HPI: 74 y/o WM with whom I am doing a telephone visit today (due to COVID-19 pandemic restrictions). He is accompanied by his [name] for this 5 day f/u for recent covid 19 illness.   A/P as of last o/v: "1) Covid 19 pneumonia, with acute on chronic RF: back to baseline resp status for the most part. I just need to clarify his oxygen use/needs when I speak with him/Dominic again next week. He is using pulmicort inhaler now instead of neb.  2) CHF: no suspicion of volume overload at this time. Limiting fluids to 2L per day.  Currently taking 80mg  demadex qd. May consider slow/cautious restart of carvedilol/losartan/spironolactone as time goes on if conditions permit. BMET future.  3) A-fib: rate controlled with digoxin at this time, also on eliquis w/out signs of bleeding. CBC future, BMET future.  4) DM 2, poor control, brittle: Decided to stop lantus hs. Continue with just levemir and take 36 U qhs for now and we'll have to see how fasting glucoses go and adjust as necessary.  Dominic is good with monitoring glucoses. We'll stick with the same sliding scale for his novolog for now and I sent in RF of this to fill now.  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 Creatinine/GFR baseline 1.5 and 45 respectively.  He has low muscle mass so I suspect his kidney function is worse than estimated by labs. BMET future.  6) Debilitated patient: according to the SNF d/c papers the Dominic has today, HH has been ordered for him ([name]/OT/Aid) and they are supposed to get called in the next 2 d or so."  Interim hx: I ordered CBC and BMET that I hoped would be done by today but he has not been able to get these yet.  Resp: oxygen use->2L continuously, which is his baseline. Wt today 145 lbs, staying in 140s.   Family is helping him with following fluid/diet requirements.  DM: glucoses improved.  Taking glipizide along with levemir 36 qd, has novolog or humalog for mealtime use per sliding scale. 128 fasting this morning.  195 today at lunch.  Typically <200 lately. Mealtime insulin only needed once last few days for gluc 225.   Debilitated [name]: He still sometimes says he feels "rough", other times feels fine.  Dominic says he seems to be doing fine/stable.   HH [name] arranged, OT assessing him today.  ROS: See pertinent positives and negatives per HPI.  Past Medical History:  Diagnosis Date  . AICD (automatic cardioverter/defibrillator) present   . Alcohol abuse   . Anxiety   . Bipolar disorder (HCC)   . Biventricular automatic implantable cardioverter defibrillator in situ    a. 01/2014 s/p MDT DTBA1D1 Viva XT CRT-D (ser # [id number]).  . CAD (coronary artery disease)    a. s/p prior PCI/stenting of the LCX;  b. 08/2014 MV: EF 20%, large septal, apical, and inferior infarct from apex to base, no ischemia;  c. 08/2014 NSTEMI/Cath: LM mod distal dzs extending into ostial LCX (80%), LAD tortuous, RI nl, OM mod dzs, RCA 100 CTO with R->R and L->R collats-->Med Rx.  . CHF (congestive heart failure) ([place])   . Chronic atrial fibrillation (HCC)    a. CHA2DS2VASc = 6->eliquis;  b. S/P AVN RFCA an BiV ICD placement.  . Chronic renal insufficiency, stage 3  (moderate) 2020   GFR low 40s (estimated baseline sCr 1.5)  . Chronic respiratory failure (HCC)    hypoxic and hypercarbic  . Chronic systolic congestive heart failure ([place])    a. 01/2014 Echo: EF 25-30%, mod conc LVH, mod dil LA.  Marland Kitchen Complete heart block ([place])    a. In setting of prior AV nodal ablation r/t afib-->BiV ICD (01/2014).  . COPD (chronic obstructive pulmonary disease) ([place])   . COVID-19 virus infection 05/26/2019  . CVA (cerebral vascular accident) ([place])    a. Multiple prior embolic strokes.  . Dementia ([place])    suspect vascular  . Depression   . Diabetes mellitus ([place])   . DVT (deep venous thrombosis) ([place])   . Hepatic steatosis    w/out imaging evidence of cirrhosis on u/s or CT scan (as of 01/2019).  +hx of elevated ammonia.  . Hypertension   . Mitral valve disease    a. remote mitral replacement with Bjork Shiley valve;  b. 06/2006 Redo MVR with tissue valve.  . Mixed Ischemic and Nonischemic Cardiomyopathy    a. 8/.2015 Echo: Ef 25-30%;  b. 01/2014 s/p MDT [id number] Pershing Proud XT CRT-D (ser # [id number]).  . On home oxygen therapy    "2L; 24/7" (65/16/2019)  . Psoriasis    "back of his head; elbows; finger of left hand" (06/07/2017)  . Pure hypercholesterolemia   . S/P AV nodal ablation   . Seborrheic dermatitis   . Tobacco dependence    current as of 07/2018  . Ventral hernia     Past Surgical History:  Procedure Laterality Date  . AV NODE ABLATION  2007  . CARDIAC CATHETERIZATION  06/2006   Mild ostial L main stenosis, CFX stent patent, RCA occluded (old)  . CORONARY ARTERY BYPASS GRAFT  2008  . CYSTOSCOPY W/ URETERAL STENT PLACEMENT  07/12/2011   Procedure: CYSTOSCOPY WITH RETROGRADE PYELOGRAM/URETERAL STENT PLACEMENT;  Surgeon: Alexis Frock, MD;  Location: [place];  Service: Urology;  Laterality: Left;  . ECHO DOPPLER COMPLETE(TRANSTHOR) ([facility] HX)  04/07/2017   LV EF: 25%-30%  . EEG  2019 and 12/22/18   2019 abnl->no seizures, not on seizure meds (as of 01/2019).  Rpt  EEG 12/2018 no epileptiform activity.  . EXPLORATORY LAPAROTOMY    . HERNIA REPAIR    . ICD GENERATOR CHANGE  02/01/2014   Gen change to: Medtronic VIVA pulse generator, serial number [id number]  . INSERT / REPLACE / REMOVE PACEMAKER    . LEFT HEART CATHETERIZATION WITH CORONARY ANGIOGRAM N/A 08/23/2014   Procedure: LEFT HEART CATHETERIZATION WITH CORONARY ANGIOGRAM;  Surgeon: Jettie Booze, MD;  Location: [facility] CATH LAB;  Service: Cardiovascular;  Laterality: N/A;  . MITRAL VALVE REPLACEMENT     remote [facility] valve [id number] with redo tissue valve 06/2006  . PACEMAKER GENERATOR CHANGE N/A 02/01/2014   Procedure: PACEMAKER GENERATOR CHANGE;  Surgeon: Deboraha Sprang, MD;  Location: [facility] CATH LAB;  Service: Cardiovascular;  Laterality: N/A;  . PACEMAKER PLACEMENT  2006  Changed to CRT-D in 2009  . RIGHT/LEFT HEART CATH AND CORONARY ANGIOGRAPHY N/A 06/09/2017   Procedure: RIGHT/LEFT HEART CATH AND CORONARY ANGIOGRAPHY;  Surgeon: [name], MD;  Location: [facility] INVASIVE CV LAB;  Service: Cardiovascular;  Laterality: N/A;  . TRANSTHORACIC ECHOCARDIOGRAM  10/2017   EF 30-35%, inf-lat and inf akin, MV bioprosth ok, mod dec RV fxn, pulm HTN (peak 65).  [name]      Family History  Problem Relation Age of Onset  . Alzheimer's disease Mother   . CAD Mother   . Heart attack Father   . Heart disease Father   . CAD Father   . Heart attack Son   . Varicose Veins Son   . Deep vein thrombosis Son   . Heart disease Son   . Stroke Other   . Heart disease Other   . Heart disease Brother   . CAD Brother       Current Outpatient Medications:  .  acetaminophen (TYLENOL) 325 MG tablet, Take 2 tablets (650 mg total) by mouth every 6 (six) hours as needed for mild pain (or Fever >/= 101)., Disp:  , Rfl:  .  atorvastatin (LIPITOR) 40 MG tablet, Take 1 tablet (40 mg total) by mouth daily at 6 PM. (Patient taking differently: Take 40 mg by mouth every evening. ), Disp: 90 tablet, Rfl: 2 .   budesonide (PULMICORT) 0.5 MG/2ML nebulizer solution, Take 2 mLs (0.5 mg total) by nebulization 2 (two) times daily. DX: J44.9, Disp: 120 mL, Rfl: 4 .  digoxin (LANOXIN) 0.125 MG tablet, Take 1 tablet (0.125 mg total) by mouth daily., Disp: 90 tablet, Rfl: 2 .  ELIQUIS 5 MG TABS tablet, Take 1 tablet by mouth twice daily., Disp: 180 tablet, Rfl: 2 .  HUMALOG KWIKPEN 100 UNIT/ML KwikPen, Sliding scale, Disp: , Rfl:  .  insulin detemir (LEVEMIR) 100 UNIT/ML injection, Inject 0.36 mLs (36 Units total) into the skin at bedtime., Disp: 10 mL, Rfl: 11 .  Insulin Pen Needle 31G X 8 MM MISC, Use to inject 36 U into skin once daily., Disp: 100 each, Rfl: 1 .  lactulose (CHRONULAC) 10 GM/15ML solution, TAKE 30 MILLILITERS BY MOUTH TWICE A DAY (Patient taking differently: Take 30 g by mouth daily. ), Disp: 1800 mL, Rfl: 6 .  Multiple Vitamin (MULTIVITAMIN WITH MINERALS) TABS tablet, Take 1 tablet by mouth daily. (Patient taking differently: Take 1 tablet by mouth at bedtime. ), Disp: 30 tablet, Rfl: 2 .  nitroGLYCERIN (NITROSTAT) 0.4 MG SL tablet, Place 1 tablet (0.4 mg total) under the tongue every 5 (five) minutes as needed for chest pain., Disp: 25 tablet, Rfl: 2 .  thiamine 100 MG tablet, Take 1 tablet (100 mg total) by mouth daily. (Patient taking differently: Take 100 mg by mouth at bedtime. ), Disp: 30 tablet, Rfl: 0 .  torsemide (DEMADEX) 20 MG tablet, Take 4 tablets (80 mg total) by mouth daily., Disp: 120 tablet, Rfl: 4 .  arformoterol (BROVANA) 15 MCG/2ML NEBU, Take 15 mcg by nebulization 2 (two) times daily., Disp: , Rfl:   EXAM:  VITALS per patient if applicable:  BP (!) 122/52   Pulse (!) 59   Temp 97.9 F (36.6 C) (Oral)    GENERAL: alert, oriented per [name]. No distress.   No further exam b/c audio visit only.  LABS: none today    Chemistry      Component Value Date/Time   NA 137 06/14/2019 0120   NA 141 12/13/2017  0000   K 4.5 06/14/2019 0120   CL 94 (L) 06/14/2019  0120   CO2 30 06/14/2019 0120   BUN 50 (H) 06/14/2019 0120   BUN 26 (A) 12/13/2017 0000   CREATININE 1.54 (H) 06/14/2019 0120   GLU 220 12/13/2017 0000      Component Value Date/Time   CALCIUM 9.2 06/14/2019 0120   ALKPHOS 63 06/14/2019 0120   AST 37 06/14/2019 0120   ALT 36 06/14/2019 0120   BILITOT 0.6 06/14/2019 0120     Lab Results  Component Value Date   HGBA1C 11.1 (H) 05/30/2019     ASSESSMENT AND PLAN:  Discussed the following assessment and plan:  1) DM 2, poor control, labile glucoses but improving the last 1 wk. Checking glucose by fingerstick 4 times per day.  Given his hx of diabetes complications and labile glucoses we'll get him set up with continuous glucose monitoring system (dexcom). No changes today. If gluc's similar at f/u in 1 wk then we'll make small changes in insulin dosing slowly--particularly in regard to lowering the parameters in his meal time sliding scale.  2) Chronic hypox resp failure: COPD and chronic diastolic HF. Wt and oxygen requirement and breathing all seem stable per Dominic's report. No changes in meds at this time, except Dominic was unsure about whether he should be taking the brovana or not-->I told her to give the brovana neb bid along with budesonide neb bid. Continue daily wt monitoring, low Na diet, fluid restriction to 2L/day.  3) CRI III, with AKI in hosp: close monitoring with [name] with HF on diuretics and fluid restriction. Creatinine/GFR baseline 1.5 and 45 respectively.  He has low muscle mass so I suspect his kidney function is worse than estimated by labs. BMET future. Set to come in for lab visit in 3d.  4)  A-fib: rate controlled with digoxin at this time, also on eliquis w/out signs of bleeding. CBC future, BMET future.  5) Debilitated patient: [name] is set up via HH.  OT to eval him today. [name] seems to think she has all she needs to take care of him (in regard to meds and any DME).   I discussed the  assessment and treatment plan with the patient/[name]'s Dominic. The patient/Dominic was provided an opportunity to ask questions and all were answered. The patient/Dominic agreed with the plan and demonstrated an understanding of the instructions.   The patient/Dominic was advised to call back or seek an in-person evaluation if the symptoms worsen or if the condition fails to improve as anticipated.  Spent 20 min with [name] today, with >50% of this time spent in counseling and care coordination regarding the above problems.  F/u: 1 week telemed.  Has lab appt for CBC and BMET 3d.  Signed:  Santiago Bumpers, MD           06/27/2019

## 2019-06-28 ENCOUNTER — Ambulatory Visit: Payer: Medicare Other | Admitting: Family Medicine

## 2019-06-28 ENCOUNTER — Telehealth: Payer: Self-pay

## 2019-06-28 ENCOUNTER — Telehealth: Payer: Self-pay | Admitting: Internal Medicine

## 2019-06-28 DIAGNOSIS — N183 Chronic kidney disease, stage 3 unspecified: Secondary | ICD-10-CM

## 2019-06-28 DIAGNOSIS — F319 Bipolar disorder, unspecified: Secondary | ICD-10-CM

## 2019-06-28 DIAGNOSIS — J9621 Acute and chronic respiratory failure with hypoxia: Secondary | ICD-10-CM | POA: Diagnosis not present

## 2019-06-28 DIAGNOSIS — Z951 Presence of aortocoronary bypass graft: Secondary | ICD-10-CM

## 2019-06-28 DIAGNOSIS — F0390 Unspecified dementia without behavioral disturbance: Secondary | ICD-10-CM

## 2019-06-28 DIAGNOSIS — E1122 Type 2 diabetes mellitus with diabetic chronic kidney disease: Secondary | ICD-10-CM

## 2019-06-28 DIAGNOSIS — E1151 Type 2 diabetes mellitus with diabetic peripheral angiopathy without gangrene: Secondary | ICD-10-CM

## 2019-06-28 DIAGNOSIS — I442 Atrioventricular block, complete: Secondary | ICD-10-CM

## 2019-06-28 DIAGNOSIS — I2510 Atherosclerotic heart disease of native coronary artery without angina pectoris: Secondary | ICD-10-CM

## 2019-06-28 DIAGNOSIS — Z794 Long term (current) use of insulin: Secondary | ICD-10-CM

## 2019-06-28 DIAGNOSIS — Z952 Presence of prosthetic heart valve: Secondary | ICD-10-CM

## 2019-06-28 DIAGNOSIS — F1010 Alcohol abuse, uncomplicated: Secondary | ICD-10-CM

## 2019-06-28 DIAGNOSIS — E1165 Type 2 diabetes mellitus with hyperglycemia: Secondary | ICD-10-CM

## 2019-06-28 DIAGNOSIS — I130 Hypertensive heart and chronic kidney disease with heart failure and stage 1 through stage 4 chronic kidney disease, or unspecified chronic kidney disease: Secondary | ICD-10-CM

## 2019-06-28 DIAGNOSIS — U071 COVID-19: Secondary | ICD-10-CM | POA: Diagnosis not present

## 2019-06-28 DIAGNOSIS — I428 Other cardiomyopathies: Secondary | ICD-10-CM

## 2019-06-28 DIAGNOSIS — I482 Chronic atrial fibrillation, unspecified: Secondary | ICD-10-CM

## 2019-06-28 DIAGNOSIS — J1281 Pneumonia due to SARS-associated coronavirus: Secondary | ICD-10-CM | POA: Diagnosis not present

## 2019-06-28 DIAGNOSIS — Z9981 Dependence on supplemental oxygen: Secondary | ICD-10-CM

## 2019-06-28 DIAGNOSIS — J9622 Acute and chronic respiratory failure with hypercapnia: Secondary | ICD-10-CM

## 2019-06-28 DIAGNOSIS — F17210 Nicotine dependence, cigarettes, uncomplicated: Secondary | ICD-10-CM

## 2019-06-28 DIAGNOSIS — F419 Anxiety disorder, unspecified: Secondary | ICD-10-CM

## 2019-06-28 DIAGNOSIS — I5022 Chronic systolic (congestive) heart failure: Secondary | ICD-10-CM

## 2019-06-28 DIAGNOSIS — Z7951 Long term (current) use of inhaled steroids: Secondary | ICD-10-CM

## 2019-06-28 DIAGNOSIS — Z95810 Presence of automatic (implantable) cardiac defibrillator: Secondary | ICD-10-CM

## 2019-06-28 DIAGNOSIS — J440 Chronic obstructive pulmonary disease with acute lower respiratory infection: Secondary | ICD-10-CM | POA: Diagnosis not present

## 2019-06-28 DIAGNOSIS — I513 Intracardiac thrombosis, not elsewhere classified: Secondary | ICD-10-CM

## 2019-06-28 DIAGNOSIS — Z7901 Long term (current) use of anticoagulants: Secondary | ICD-10-CM

## 2019-06-28 NOTE — Telephone Encounter (Signed)
Dr. [name], Please advise on what you would like to do in regards to patient not using his NIV.

## 2019-06-28 NOTE — Telephone Encounter (Signed)
Received voicemail from PT and OT therapist requesting VO to continue OT for 1x a week for 1 week, 2x a week for 2 weeks,and 1x a week for 2 weeks. PT 2x a week for 4 weeks and 1x a week for 2 weeks for strengthening, gait, balance training and home safety.  CB # for [place], [place] is [phone] and [facility] [phone].  Please advise, thanks.

## 2019-06-28 NOTE — Telephone Encounter (Signed)
LMTCB for [name] and also for the pt

## 2019-06-28 NOTE — Telephone Encounter (Signed)
Fine to d/c it but ov w/in 2 weeks with NP to regroup and make sure he doesn't end up in hospital > return with all meds

## 2019-06-28 NOTE — Telephone Encounter (Signed)
Contacted [facility]. Verbal orders okay'd by PCP. Left secure message for [facility] regarding PT.

## 2019-06-28 NOTE — Telephone Encounter (Signed)
Yes this is good. 

## 2019-06-29 DIAGNOSIS — Z794 Long term (current) use of insulin: Secondary | ICD-10-CM | POA: Diagnosis not present

## 2019-06-29 DIAGNOSIS — J440 Chronic obstructive pulmonary disease with acute lower respiratory infection: Secondary | ICD-10-CM | POA: Diagnosis not present

## 2019-06-29 DIAGNOSIS — N183 Chronic kidney disease, stage 3 unspecified: Secondary | ICD-10-CM | POA: Diagnosis not present

## 2019-06-29 DIAGNOSIS — I2510 Atherosclerotic heart disease of native coronary artery without angina pectoris: Secondary | ICD-10-CM | POA: Diagnosis not present

## 2019-06-29 DIAGNOSIS — I482 Chronic atrial fibrillation, unspecified: Secondary | ICD-10-CM | POA: Diagnosis not present

## 2019-06-29 DIAGNOSIS — I130 Hypertensive heart and chronic kidney disease with heart failure and stage 1 through stage 4 chronic kidney disease, or unspecified chronic kidney disease: Secondary | ICD-10-CM | POA: Diagnosis not present

## 2019-06-29 DIAGNOSIS — J9622 Acute and chronic respiratory failure with hypercapnia: Secondary | ICD-10-CM | POA: Diagnosis not present

## 2019-06-29 DIAGNOSIS — I5022 Chronic systolic (congestive) heart failure: Secondary | ICD-10-CM | POA: Diagnosis not present

## 2019-06-29 DIAGNOSIS — E1151 Type 2 diabetes mellitus with diabetic peripheral angiopathy without gangrene: Secondary | ICD-10-CM | POA: Diagnosis not present

## 2019-06-29 DIAGNOSIS — E1165 Type 2 diabetes mellitus with hyperglycemia: Secondary | ICD-10-CM | POA: Diagnosis not present

## 2019-06-29 DIAGNOSIS — I513 Intracardiac thrombosis, not elsewhere classified: Secondary | ICD-10-CM | POA: Diagnosis not present

## 2019-06-29 DIAGNOSIS — Z9981 Dependence on supplemental oxygen: Secondary | ICD-10-CM | POA: Diagnosis not present

## 2019-06-29 DIAGNOSIS — Z952 Presence of prosthetic heart valve: Secondary | ICD-10-CM | POA: Diagnosis not present

## 2019-06-29 DIAGNOSIS — J9621 Acute and chronic respiratory failure with hypoxia: Secondary | ICD-10-CM | POA: Diagnosis not present

## 2019-06-29 DIAGNOSIS — I428 Other cardiomyopathies: Secondary | ICD-10-CM | POA: Diagnosis not present

## 2019-06-29 DIAGNOSIS — I442 Atrioventricular block, complete: Secondary | ICD-10-CM | POA: Diagnosis not present

## 2019-06-29 DIAGNOSIS — F17210 Nicotine dependence, cigarettes, uncomplicated: Secondary | ICD-10-CM | POA: Diagnosis not present

## 2019-06-29 DIAGNOSIS — E1122 Type 2 diabetes mellitus with diabetic chronic kidney disease: Secondary | ICD-10-CM | POA: Diagnosis not present

## 2019-06-29 DIAGNOSIS — Z95810 Presence of automatic (implantable) cardiac defibrillator: Secondary | ICD-10-CM | POA: Diagnosis not present

## 2019-06-29 NOTE — Telephone Encounter (Signed)
Spoke with pt's daughter and I informed her about message from [facility]. I made the pt an appointment with Buelah Manis on 07/13/2019 at 11:15. Nothing further is needed.

## 2019-06-30 ENCOUNTER — Other Ambulatory Visit: Payer: Self-pay

## 2019-06-30 ENCOUNTER — Telehealth: Payer: Self-pay

## 2019-06-30 ENCOUNTER — Ambulatory Visit: Payer: Medicare Other | Admitting: Family Medicine

## 2019-06-30 ENCOUNTER — Ambulatory Visit: Payer: Medicare Other

## 2019-06-30 MED ORDER — GLIPIZIDE ER 2.5 MG PO TB24: 2.5000 mg | ORAL_TABLET | ORAL | 1 refills | Status: DC

## 2019-06-30 NOTE — Addendum Note (Signed)
Addended by: Emi Holes D on: 06/30/2019 02:16 PM   Modules accepted: Orders

## 2019-06-30 NOTE — Telephone Encounter (Signed)
[facility], glipizide 2.5mg  ER and updated directions to say "1 tab po qd". Patient had recent visit on 06/27/19 for f/u DM 2 and this med was d/c during ED visit on 06/14/19. CB# [phone], opt 1 for providers.  Please advise, thanks.

## 2019-06-30 NOTE — Telephone Encounter (Signed)
OK to rx glipizide ER 2.5, 1 tab po qAM, #90, RF x 1.-thx

## 2019-06-30 NOTE — Addendum Note (Signed)
Addended by: Mallorey Odonell D on: 06/30/2019 02:16 PM   Modules accepted: Orders  

## 2019-06-30 NOTE — Telephone Encounter (Signed)
Rx sent 

## 2019-07-02 ENCOUNTER — Other Ambulatory Visit: Payer: Self-pay

## 2019-07-02 ENCOUNTER — Inpatient Hospital Stay (HOSPITAL_COMMUNITY)
Admission: EM | Admit: 2019-07-02 | Discharge: 2019-07-05 | DRG: 177 | Disposition: A | Discharge status: Hospice | Payer: Medicare Other | Attending: Internal Medicine | Admitting: Internal Medicine

## 2019-07-02 ENCOUNTER — Encounter (HOSPITAL_COMMUNITY): Payer: Self-pay | Admitting: Emergency Medicine

## 2019-07-02 ENCOUNTER — Emergency Department (HOSPITAL_COMMUNITY): Payer: Medicare Other

## 2019-07-02 DIAGNOSIS — R627 Adult failure to thrive: Secondary | ICD-10-CM | POA: Diagnosis present

## 2019-07-02 DIAGNOSIS — E785 Hyperlipidemia, unspecified: Secondary | ICD-10-CM | POA: Diagnosis present

## 2019-07-02 DIAGNOSIS — I5022 Chronic systolic (congestive) heart failure: Secondary | ICD-10-CM | POA: Diagnosis not present

## 2019-07-02 DIAGNOSIS — E1165 Type 2 diabetes mellitus with hyperglycemia: Secondary | ICD-10-CM | POA: Diagnosis not present

## 2019-07-02 DIAGNOSIS — J9601 Acute respiratory failure with hypoxia: Secondary | ICD-10-CM | POA: Diagnosis present

## 2019-07-02 DIAGNOSIS — N1831 Chronic kidney disease, stage 3a: Secondary | ICD-10-CM | POA: Diagnosis not present

## 2019-07-02 DIAGNOSIS — N179 Acute kidney failure, unspecified: Secondary | ICD-10-CM | POA: Diagnosis present

## 2019-07-02 DIAGNOSIS — Z95810 Presence of automatic (implantable) cardiac defibrillator: Secondary | ICD-10-CM

## 2019-07-02 DIAGNOSIS — K7290 Hepatic failure, unspecified without coma: Secondary | ICD-10-CM | POA: Diagnosis present

## 2019-07-02 DIAGNOSIS — I482 Chronic atrial fibrillation, unspecified: Secondary | ICD-10-CM | POA: Diagnosis present

## 2019-07-02 DIAGNOSIS — R0902 Hypoxemia: Secondary | ICD-10-CM | POA: Diagnosis not present

## 2019-07-02 DIAGNOSIS — I509 Heart failure, unspecified: Secondary | ICD-10-CM

## 2019-07-02 DIAGNOSIS — I2510 Atherosclerotic heart disease of native coronary artery without angina pectoris: Secondary | ICD-10-CM | POA: Diagnosis not present

## 2019-07-02 DIAGNOSIS — Z7189 Other specified counseling: Secondary | ICD-10-CM | POA: Diagnosis not present

## 2019-07-02 DIAGNOSIS — G4733 Obstructive sleep apnea (adult) (pediatric): Secondary | ICD-10-CM | POA: Diagnosis not present

## 2019-07-02 DIAGNOSIS — E875 Hyperkalemia: Secondary | ICD-10-CM | POA: Diagnosis not present

## 2019-07-02 DIAGNOSIS — Z955 Presence of coronary angioplasty implant and graft: Secondary | ICD-10-CM

## 2019-07-02 DIAGNOSIS — I110 Hypertensive heart disease with heart failure: Secondary | ICD-10-CM | POA: Diagnosis not present

## 2019-07-02 DIAGNOSIS — F0390 Unspecified dementia without behavioral disturbance: Secondary | ICD-10-CM | POA: Diagnosis not present

## 2019-07-02 DIAGNOSIS — I130 Hypertensive heart and chronic kidney disease with heart failure and stage 1 through stage 4 chronic kidney disease, or unspecified chronic kidney disease: Secondary | ICD-10-CM | POA: Diagnosis present

## 2019-07-02 DIAGNOSIS — K469 Unspecified abdominal hernia without obstruction or gangrene: Secondary | ICD-10-CM | POA: Diagnosis present

## 2019-07-02 DIAGNOSIS — R05 Cough: Secondary | ICD-10-CM | POA: Diagnosis not present

## 2019-07-02 DIAGNOSIS — Z743 Need for continuous supervision: Secondary | ICD-10-CM | POA: Diagnosis not present

## 2019-07-02 DIAGNOSIS — R1310 Dysphagia, unspecified: Secondary | ICD-10-CM | POA: Diagnosis present

## 2019-07-02 DIAGNOSIS — Z8673 Personal history of transient ischemic attack (TIA), and cerebral infarction without residual deficits: Secondary | ICD-10-CM

## 2019-07-02 DIAGNOSIS — I10 Essential (primary) hypertension: Secondary | ICD-10-CM | POA: Diagnosis present

## 2019-07-02 DIAGNOSIS — Z820 Family history of epilepsy and other diseases of the nervous system: Secondary | ICD-10-CM

## 2019-07-02 DIAGNOSIS — K439 Ventral hernia without obstruction or gangrene: Secondary | ICD-10-CM | POA: Diagnosis not present

## 2019-07-02 DIAGNOSIS — E43 Unspecified severe protein-calorie malnutrition: Secondary | ICD-10-CM | POA: Diagnosis not present

## 2019-07-02 DIAGNOSIS — J690 Pneumonitis due to inhalation of food and vomit: Secondary | ICD-10-CM | POA: Diagnosis not present

## 2019-07-02 DIAGNOSIS — Z8616 Personal history of COVID-19: Secondary | ICD-10-CM

## 2019-07-02 DIAGNOSIS — J9622 Acute and chronic respiratory failure with hypercapnia: Secondary | ICD-10-CM | POA: Diagnosis present

## 2019-07-02 DIAGNOSIS — Z9981 Dependence on supplemental oxygen: Secondary | ICD-10-CM

## 2019-07-02 DIAGNOSIS — Z951 Presence of aortocoronary bypass graft: Secondary | ICD-10-CM

## 2019-07-02 DIAGNOSIS — J9611 Chronic respiratory failure with hypoxia: Secondary | ICD-10-CM | POA: Diagnosis present

## 2019-07-02 DIAGNOSIS — F17210 Nicotine dependence, cigarettes, uncomplicated: Secondary | ICD-10-CM | POA: Diagnosis present

## 2019-07-02 DIAGNOSIS — Z515 Encounter for palliative care: Secondary | ICD-10-CM | POA: Diagnosis not present

## 2019-07-02 DIAGNOSIS — Z6826 Body mass index (BMI) 26.0-26.9, adult: Secondary | ICD-10-CM

## 2019-07-02 DIAGNOSIS — Z66 Do not resuscitate: Secondary | ICD-10-CM | POA: Diagnosis not present

## 2019-07-02 DIAGNOSIS — J9621 Acute and chronic respiratory failure with hypoxia: Secondary | ICD-10-CM | POA: Diagnosis not present

## 2019-07-02 DIAGNOSIS — K8020 Calculus of gallbladder without cholecystitis without obstruction: Secondary | ICD-10-CM | POA: Diagnosis not present

## 2019-07-02 DIAGNOSIS — K5730 Diverticulosis of large intestine without perforation or abscess without bleeding: Secondary | ICD-10-CM | POA: Diagnosis not present

## 2019-07-02 DIAGNOSIS — E1122 Type 2 diabetes mellitus with diabetic chronic kidney disease: Secondary | ICD-10-CM | POA: Diagnosis not present

## 2019-07-02 DIAGNOSIS — Z953 Presence of xenogenic heart valve: Secondary | ICD-10-CM

## 2019-07-02 DIAGNOSIS — Z8249 Family history of ischemic heart disease and other diseases of the circulatory system: Secondary | ICD-10-CM

## 2019-07-02 LAB — POCT I-STAT 7, (LYTES, BLD GAS, ICA,H+H)
Acid-Base Excess: 12 mmol/L — ABNORMAL HIGH (ref 0.0–2.0)
Bicarbonate: 38.4 mmol/L — ABNORMAL HIGH (ref 20.0–28.0)
Calcium, Ion: 1.19 mmol/L (ref 1.15–1.40)
HCT: 32 % — ABNORMAL LOW (ref 39.0–52.0)
Hemoglobin: 10.9 g/dL — ABNORMAL LOW (ref 13.0–17.0)
O2 Saturation: 90 %
Patient temperature: 99.2
Potassium: 4.7 mmol/L (ref 3.5–5.1)
Sodium: 139 mmol/L (ref 135–145)
TCO2: 40 mmol/L — ABNORMAL HIGH (ref 22–32)
pCO2 arterial: 61.1 mmHg — ABNORMAL HIGH (ref 32.0–48.0)
pH, Arterial: 7.408 (ref 7.350–7.450)
pO2, Arterial: 62 mmHg — ABNORMAL LOW (ref 83.0–108.0)

## 2019-07-02 LAB — LACTIC ACID, PLASMA: Lactic Acid, Venous: 2.3 mmol/L (ref 0.5–1.9)

## 2019-07-02 MED ORDER — IPRATROPIUM-ALBUTEROL 0.5-2.5 (3) MG/3ML IN SOLN
3.0000 mL | Freq: Once | RESPIRATORY_TRACT | Status: DC
  Filled 2019-07-02: qty 3

## 2019-07-02 MED ORDER — FUROSEMIDE 10 MG/ML IJ SOLN
20.0000 mg | Freq: Once | INTRAMUSCULAR | Status: AC
  Administered 2019-07-03: 20 mg via INTRAVENOUS
  Filled 2019-07-02: qty 2

## 2019-07-02 MED ORDER — DEXAMETHASONE SODIUM PHOSPHATE 10 MG/ML IJ SOLN
10.0000 mg | Freq: Once | INTRAMUSCULAR | Status: AC
  Administered 2019-07-03: 10 mg via INTRAVENOUS
  Filled 2019-07-02: qty 1

## 2019-07-02 NOTE — ED Notes (Signed)
RN returned daughter's call.  She expressed concern that the pt appeared to be choking earlier today on "very thick mucus."  She is concerned he will choke on his mucus when she is sleeping and she will not be able to hear him to assist him.  RN will pass message to provider.

## 2019-07-02 NOTE — ED Triage Notes (Signed)
Per EMS, pt home w/ family, began throwing up tonight as he was eating.  Pt was found to be at 88% on 4L home O2, increased to 98% when placed on a NRB at 15L.  Ronci in the lower lobes along w/ a constant cough.  No fevers noted.  Abdomen does appear distended and is tender in LLQ.  Family reports dementia and he will not express pain until pressed.    128/70 60 heart rate CBG 247

## 2019-07-02 NOTE — ED Provider Notes (Signed)
74 year old male received a signout from Dr. Erich Montane, ER resident, under the supervision of Dr. [name]. Per his HPI:   "74 year old male presenting to the emergency department for episode of emesis earlier this evening as well as transient episode of respiratory distress.  Per report, patient was recently admitted secondary to Covid, has been home for a week, when he was eating dinner tonight when he had one episode of emesis, and family states that he became hypoxic into the 70s on room air.  Patient wears 4 L nasal cannula at home at baseline, per EMS, when they got there he was in the upper 80s on his 4 L nasal cannula, placed on a nonrebreather with improvement to 100%.  Patient has extensive past medical history including CAD, COPD, CHF, EMS did state that he has dementia as well, history gained from daughter as well as EMS.  Patient has a known hernia, however EMS states that they felt like his abdomen was more distended than usual per family.  No recent fevers, no recent chills, other than his recent Covid infection, patient has had no other issues, lives at home with his daughter.  She is the POA.  On arrival to the ED, patient was satting 100% on nonrebreather, awake alert, answering questions, somewhat confused otherwise moving all his extremities.  Hemodynamically stable, patient was placed on his home 4 L of oxygen, satting 90 to 92%.  Daughter does report patient has had ongoing cough since his last Covid admission, patient himself states that he has shortness of breath but that is his baseline, denies any new changes in that, denies any chest pain, does not denies any abdominal pain, but is tender in the left lower quadrant."  Physical Exam  BP 119/64   Pulse 62   Temp 99.2 F (37.3 C) (Oral)   Resp (!) 21   SpO2 95%   Physical Exam Vitals and nursing note reviewed.  Constitutional:      Appearance: He is well-developed.     Comments: Pleasantly demented, elderly gentleman with  nasal cannula in place.  HENT:     Head: Normocephalic and atraumatic.  Pulmonary:     Effort: Pulmonary effort is normal.     Comments: Able to speak in complete sentences with nasal cannula in place without increased work of breathing. Abdominal:     General: There is distension.     Tenderness: There is no abdominal tenderness. There is no right CVA tenderness, left CVA tenderness, guarding or rebound.     Hernia: A hernia is present.     Comments: Abdomen is distended but soft.  Abdomen is nontender to palpation.  No peritoneal signs.  There is a midline ventral hernia that is reducible.  No pain with palpation of the hernia.  Musculoskeletal:     Cervical back: Neck supple.  Neurological:     Mental Status: He is alert and oriented to person, place, and time.     Cranial Nerves: No cranial nerve deficit.  Psychiatric:        Behavior: Behavior normal.     ED Course/Procedures     Procedures  MDM  74 year old male received at signout from Dr. Durene Cal, ER resident, under the supervision of Dr. [name] pending labs, imaging, and medical admission.  The patient has an extensive medical history including CAD, COPD, CHF, dementia, and COVID-19.    In brief, the patient tested positive for COVID-19 on 05/26/19.  He was admitted for 15 days at [facility] for hypoxia secondary to COVID-19 and was discharged on January 6.  Earlier tonight, the patient had an episode of vomiting while eating dinner.  Following the episode, he became hypoxic into the 70s on room air.  Of note, patient wears 4 L nasal cannula at home per baseline.  Patient was only satting in the 80s on his usual home 4 L when EMS arrived, but improved to 100% when he was placed on a nonrebreather.  He was later transitioned to 6 L nasal cannula in the ER and is now satting in the mid to high 90s on 5 L.   He was given Decadron and Lasix by Dr. Yong Channel.  Dr. Yong Channel was concern for left lower quadrant pain on exam and  CT abdomen pelvis is pending.  Given acutely worsening hypoxia, CT PE study is also pending.  He was given Lasix given concern for volume overload and Decadron to cover for COPD and aspiration pneumonitis.  No antibiotics given at this time as patient is afebrile and not hypotensive.   Labs today are notable for leukocytosis of 13.1, minimally changed from previous.  The patient was also given corticosteroids in the ER.  Troponin was elevated, but this appears a chronic and is decreased from previous.  Hemoglobin is 9.8, down from 12.8 earlier this month.  However, patient has had no symptoms of bleeding.  No PE seen on CT.  Spoke with radiology regarding the patient's CT who does not mention a partially calcified filling defect in the left atrium that is likely a thrombus given interval decrease in size since September.  They are also multifocal areas of groundglass opacity and mixed airspace disease throughout both lungs, likely residual opacity from patient's known COVID-19 diagnosis with superimposed edema and heart failure.  There is also significant cardiomegaly with predominant left atrial ventricular enlargement.  CT also demonstrates a Richter's hernia containing portion of the antimesenteric small bowel.  Radiology recommends a surgical consultation at some time for Richter's hernia.   Consult to the hospitalist team and Dr. Maudie Mercury will admit. The patient appears reasonably stabilized for admission considering the current resources, flow, and capabilities available in the ED at this time, and I doubt any other [facility] requiring further screening and/or treatment in the ED prior to admission.    Joanne Gavel, PA-C 07/03/19 0724    Merrily Pew, MD 07/04/19 [phone]

## 2019-07-02 NOTE — ED Provider Notes (Signed)
[facility] EMERGENCY DEPARTMENT Provider Note   CSN: [id number] Arrival date & time: 07/02/19  2001     History Chief Complaint  Patient presents with  . Shortness of Breath  . Emesis    [name] is a 74 y.o. male.  HPI 74 year old male presenting to the emergency department for episode of emesis earlier this evening as well as transient episode of respiratory distress.  Per report, patient was recently admitted secondary to Covid, has been home for a week, when he was eating dinner tonight when he had one episode of emesis, and family states that he became hypoxic into the 70s on room air.  Patient wears 4 L nasal cannula at home at baseline, per EMS, when they got there he was in the upper 80s on his 4 L nasal cannula, placed on a nonrebreather with improvement to 100%.  Patient has extensive past medical history including CAD, COPD, CHF, EMS did state that he has dementia as well, history gained from daughter as well as EMS.  Patient has a known hernia, however EMS states that they felt like his abdomen was more distended than usual per family.  No recent fevers, no recent chills, other than his recent Covid infection, patient has had no other issues, lives at home with his daughter.  She is the POA.  On arrival to the ED, patient was satting 100% on nonrebreather, awake alert, answering questions, somewhat confused otherwise moving all his extremities.  Hemodynamically stable, patient was placed on his home 4 L of oxygen, satting 90 to 92%.  Daughter does report patient has had ongoing cough since his last Covid admission, patient himself states that he has shortness of breath but that is his baseline, denies any new changes in that, denies any chest pain, does not denies any abdominal pain, but is tender in the left lower quadrant.    Past Medical History:  Diagnosis Date  . AICD (automatic cardioverter/defibrillator) present   . Alcohol abuse   . Anxiety   .  Bipolar disorder (HCC)   . Biventricular automatic implantable cardioverter defibrillator in situ    a. 01/2014 s/p MDT [id number] Viva XT CRT-D (ser # [id number]).  . CAD (coronary artery disease)    a. s/p prior PCI/stenting of the LCX;  b. 08/2014 MV: EF 20%, large septal, apical, and inferior infarct from apex to base, no ischemia;  c. 08/2014 NSTEMI/Cath: LM mod distal dzs extending into ostial LCX (80%), LAD tortuous, RI nl, OM mod dzs, RCA 100 CTO with R->R and L->R collats-->Med Rx.  . CHF (congestive heart failure) (HCC)   . Chronic atrial fibrillation (HCC)    a. CHA2DS2VASc = 6->eliquis;  b. S/P AVN RFCA an BiV ICD placement.  . Chronic renal insufficiency, stage 3 (moderate) 2020   GFR low 40s (estimated baseline sCr 1.5)  . Chronic respiratory failure (HCC)    hypoxic and hypercarbic  . Chronic systolic congestive heart failure (HCC)    a. 01/2014 Echo: EF 25-30%, mod conc LVH, mod dil LA.  Marland Kitchen Complete heart block (HCC)    a. In setting of prior AV nodal ablation r/t afib-->BiV ICD (01/2014).  . COPD (chronic obstructive pulmonary disease) (HCC)   . COVID-19 virus infection 05/26/2019   hosp + rehab  . CVA (cerebral vascular accident) (HCC)    a. Multiple prior embolic strokes.  . Dementia (HCC)    suspect vascular  . Depression   . Diabetes mellitus (HCC)   .  DVT (deep venous thrombosis) (HCC)   . Hepatic steatosis    w/out imaging evidence of cirrhosis on u/s or CT scan (as of 01/2019).  +hx of elevated ammonia.  . Hypertension   . Mitral valve disease    a. remote mitral replacement with Bjork Shiley valve;  b. 06/2006 Redo MVR with tissue valve.  . Mixed Ischemic and Nonischemic Cardiomyopathy    a. 8/.2015 Echo: Ef 25-30%;  b. 01/2014 s/p MDT [id number] Talbot Grumbling XT CRT-D (ser # [id number]).  . On home oxygen therapy    "2L; 24/7" (65/16/2019)  . Psoriasis    "back of his head; elbows; finger of left hand" (06/07/2017)  . Pure hypercholesterolemia   . S/P AV nodal ablation   .  Seborrheic dermatitis   . Tobacco dependence    current as of 07/2018  . Ventral hernia     Patient Active Problem List   Diagnosis Date Noted  . Uncontrolled type 2 diabetes mellitus with stage 3 chronic kidney disease, with long-term current use of insulin (HCC) 06/02/2019  . Uncontrolled type 2 diabetes mellitus with hyperglycemia (HCC) 03/04/2018  . Acute on chronic respiratory failure with hypercapnia (HCC)   . COPD with chronic bronchitis and emphysema (HCC)   . Acute encephalopathy   . Acute respiratory failure (HCC)   . Acute on chronic combined systolic (congestive) and diastolic (congestive) heart failure (HCC) 10/21/2017  . Chronic hypoxemic respiratory failure (HCC) 10/21/2017  . Bipolar disorder (HCC) 10/21/2017  . Chronic atrial fibrillation (HCC) 10/21/2017  . History of CVA (cerebrovascular accident) 10/21/2017  . COPD (chronic obstructive pulmonary disease) (HCC) 10/21/2017  . Tobacco abuse 10/21/2017  . History of alcohol use 10/21/2017  . Biventricular automatic implantable cardioverter defibrillator in situ 10/21/2017  . Dementia  10/21/2017  . History of mitral valve replacement 10/21/2017  . Coronary artery disease 10/21/2017  . Situational depression 10/21/2017  . Grief reaction 10/21/2017  . Chronic respiratory failure with hypoxia and hypercapnia (HCC) 06/22/2017  . CHF exacerbation (HCC) 06/06/2017  . Acute respiratory failure with hypoxia and hypercapnia (HCC)   . COPD GOLD 0 still smoking  03/02/2016  . Chronic kidney disease (CKD), stage II (mild) 11/23/2015  . CAD (coronary artery disease)   . Dementia (HCC)   . Chronic respiratory failure with hypoxia (HCC) 10/11/2015  . Chronic systolic CHF (congestive heart failure) (HCC) 05/30/2015  . Cigarette smoker 12/24/2014  . Stroke (HCC)   . Mitral valve disease   . Depression   . Mixed Ischemic and Nonischemic Cardiomyopathy   . Alcohol abuse   . Chronic anticoagulation 08/22/2014  . PVD (peripheral  vascular disease) (HCC) 02/28/2014  . Mural thrombus of heart 02/07/2014  . Hypertension 06/14/2012  . Automatic implantable cardioverter-defibrillator in situ 04/04/2012  . Hyperlipidemia 10/27/2011  . Ventricular tachycardia (HCC) 07/14/2011  . Atrial fibrillation, chronic (HCC) 07/13/2011  . Right atrial thrombus (HCC) 07/11/2011    Past Surgical History:  Procedure Laterality Date  . AV NODE ABLATION  2007  . CARDIAC CATHETERIZATION  06/2006   Mild ostial L main stenosis, CFX stent patent, RCA occluded (old)  . CORONARY ARTERY BYPASS GRAFT  2008  . CYSTOSCOPY W/ URETERAL STENT PLACEMENT  07/12/2011   Procedure: CYSTOSCOPY WITH RETROGRADE PYELOGRAM/URETERAL STENT PLACEMENT;  Surgeon: Sebastian Ache, MD;  Location: [facility] OR;  Service: Urology;  Laterality: Left;  . ECHO DOPPLER COMPLETE(TRANSTHOR) ([facility] HX)  04/07/2017   LV EF: 25%-30%  . EEG  2019 and 12/22/18   2019 abnl->no seizures,  not on seizure meds (as of 01/2019).  Rpt EEG 12/2018 no epileptiform activity.  . EXPLORATORY LAPAROTOMY    . HERNIA REPAIR    . ICD GENERATOR CHANGE  02/01/2014   Gen change to: Medtronic VIVA pulse generator, serial number [id number]  . INSERT / REPLACE / REMOVE PACEMAKER    . LEFT HEART CATHETERIZATION WITH CORONARY ANGIOGRAM N/A 08/23/2014   Procedure: LEFT HEART CATHETERIZATION WITH CORONARY ANGIOGRAM;  Surgeon: Corky Crafts, MD;  Location: [facility] CATH LAB;  Service: Cardiovascular;  Laterality: N/A;  . MITRAL VALVE REPLACEMENT     remote [facility] valve 0258 with redo tissue valve 06/2006  . PACEMAKER GENERATOR CHANGE N/A 02/01/2014   Procedure: PACEMAKER GENERATOR CHANGE;  Surgeon: Duke Salvia, MD;  Location: [facility] CATH LAB;  Service: Cardiovascular;  Laterality: N/A;  . PACEMAKER PLACEMENT  2006   Changed to CRT-D in 2009  . RIGHT/LEFT HEART CATH AND CORONARY ANGIOGRAPHY N/A 06/09/2017   Procedure: RIGHT/LEFT HEART CATH AND CORONARY ANGIOGRAPHY;  Surgeon: [name], MD;  Location: [facility]  INVASIVE CV LAB;  Service: Cardiovascular;  Laterality: N/A;  . TRANSTHORACIC ECHOCARDIOGRAM  10/2017   EF 30-35%, inf-lat and inf akin, MV bioprosth ok, mod dec RV fxn, pulm HTN (peak 65).  [name]         Family History  Problem Relation Age of Onset  . Alzheimer's disease Mother   . CAD Mother   . Heart attack Father   . Heart disease Father   . CAD Father   . Heart attack Son   . Varicose Veins Son   . Deep vein thrombosis Son   . Heart disease Son   . Stroke Other   . Heart disease Other   . Heart disease Brother   . CAD Brother     Social History   Tobacco Use  . Smoking status: Current Some Day Smoker    Packs/day: 2.00    Years: 45.00    Pack years: 90.00    Types: Cigarettes  . Smokeless tobacco: Never Used  . Tobacco comment: daughter states he cannot get to cigarettes now few cigarettes a day  Substance Use Topics  . Alcohol use: Not Currently    Alcohol/week: 21.0 standard drinks    Types: 21 Cans of beer per week    Comment: 10/21/2017 "~ 2-3 beers/day"  . Drug use: No    Home Medications Prior to Admission medications   Medication Sig Start Date End Date Taking? Authorizing Provider  acetaminophen (TYLENOL) 325 MG tablet Take 2 tablets (650 mg total) by mouth every 6 (six) hours as needed for mild pain (or Fever >/= 101). 06/13/19   Lonia Blood, MD  arformoterol (BROVANA) 15 MCG/2ML NEBU Take 15 mcg by nebulization 2 (two) times daily.    [provider]  atorvastatin (LIPITOR) 40 MG tablet Take 1 tablet (40 mg total) by mouth daily at 6 PM. Patient taking differently: Take 40 mg by mouth every evening.  11/09/18   [name], MD  budesonide (PULMICORT) 0.5 MG/2ML nebulizer solution Take 2 mLs (0.5 mg total) by nebulization 2 (two) times daily. DX: J44.9 06/13/18   Nyoka Cowden, MD  Continuous Blood Gluc Sensor (DEXCOM G6 SENSOR) MISC 1 each by Does not apply route 4 (four) times daily. Use to check  glucose 4 times a day. 06/27/19    McGowen, Maryjean Morn, MD  digoxin (LANOXIN) 0.125 MG tablet Take 1 tablet (0.125 mg total) by mouth  daily. 11/09/18   Larey Dresser, MD  ELIQUIS 5 MG TABS tablet Take 1 tablet by mouth twice daily. 04/03/19   Larey Dresser, MD  glipiZIDE (GLUCOTROL XL) 2.5 MG 24 hr tablet Take 1 tablet (2.5 mg total) by mouth every morning. 06/30/19   McGowen, Adrian Blackwater, MD  HUMALOG KWIKPEN 100 UNIT/ML KwikPen Sliding scale 06/23/19   [provider]  insulin detemir (LEVEMIR) 100 UNIT/ML injection Inject 0.36 mLs (36 Units total) into the skin at bedtime. 06/23/19   McGowen, Adrian Blackwater, MD  Insulin Pen Needle 31G X 8 MM MISC Use to inject 36 U into skin once daily. 06/23/19   McGowen, Adrian Blackwater, MD  lactulose (CHRONULAC) 10 GM/15ML solution TAKE 30 MILLILITERS BY MOUTH TWICE A DAY Patient taking differently: Take 30 g by mouth daily.  04/12/18   Larey Dresser, MD  Multiple Vitamin (MULTIVITAMIN WITH MINERALS) TABS tablet Take 1 tablet by mouth daily. Patient taking differently: Take 1 tablet by mouth at bedtime.  05/24/15   Bonnielee Haff, MD  nitroGLYCERIN (NITROSTAT) 0.4 MG SL tablet Place 1 tablet (0.4 mg total) under the tongue every 5 (five) minutes as needed for chest pain. 11/26/17   Larey Dresser, MD  thiamine 100 MG tablet Take 1 tablet (100 mg total) by mouth daily. Patient taking differently: Take 100 mg by mouth at bedtime.  05/24/15   Bonnielee Haff, MD  torsemide (DEMADEX) 20 MG tablet Take 4 tablets (80 mg total) by mouth daily. 02/07/19   Larey Dresser, MD    Allergies    Warfarin  Review of Systems   Review of Systems  Unable to perform ROS: Dementia    Physical Exam Updated Vital Signs BP (!) 124/45   Pulse 63   Temp 99.2 F (37.3 C) (Oral)   Resp (!) 28   SpO2 98%   Physical Exam Vitals and nursing note reviewed.  Constitutional:      Appearance: He is ill-appearing. He is not toxic-appearing or diaphoretic.     Comments: Elderly, frail, chronically ill appearing   HENT:     Head: Normocephalic and atraumatic.  Eyes:     Conjunctiva/sclera: Conjunctivae normal.  Cardiovascular:     Rate and Rhythm: Normal rate and regular rhythm.     Heart sounds: No murmur.  Pulmonary:     Effort: Pulmonary effort is normal. Tachypnea present. No respiratory distress.     Breath sounds: Examination of the right-middle field reveals wheezing. Examination of the left-middle field reveals wheezing. Examination of the right-lower field reveals rales. Examination of the left-lower field reveals rales. Wheezing and rales present. No decreased breath sounds or rhonchi.  Chest:     Chest wall: No mass, deformity, tenderness or crepitus.  Abdominal:     Palpations: Abdomen is soft.     Tenderness: There is abdominal tenderness.     Comments: LLQ TTP No rebound Mild distension noted Hernia ventral, known, easily reducible  Musculoskeletal:     Cervical back: Neck supple.     Right lower leg: No edema.     Left lower leg: No edema.  Skin:    General: Skin is warm and dry.     Capillary Refill: Capillary refill takes less than 2 seconds.  Neurological:     General: No focal deficit present.     Mental Status: He is alert.     ED Results / Procedures / Treatments   Labs (all labs ordered are listed, but only  abnormal results are displayed) Labs Reviewed  POCT I-STAT 7, (LYTES, BLD GAS, ICA,H+H) - Abnormal; Notable for the following components:      Result Value   pCO2 arterial 61.1 (*)    pO2, Arterial 62.0 (*)    Bicarbonate 38.4 (*)    TCO2 40 (*)    Acid-Base Excess 12.0 (*)    HCT 32.0 (*)    Hemoglobin 10.9 (*)    All other components within normal limits  CBC WITH DIFFERENTIAL/PLATELET  COMPREHENSIVE METABOLIC PANEL  LACTIC ACID, PLASMA  LACTIC ACID, PLASMA  BRAIN NATRIURETIC PEPTIDE  PROTIME-INR  I-STAT ARTERIAL BLOOD GAS, ED  POC SARS CORONAVIRUS 2 AG -  ED  TROPONIN I (HIGH SENSITIVITY)  TROPONIN I (HIGH SENSITIVITY)    EKG None   Radiology DG Chest Portable 1 View  Result Date: 07/02/2019 CLINICAL DATA:  Vomiting tonight. Rhonchi in the lower lobes. Cough. EXAM: PORTABLE CHEST 1 VIEW COMPARISON:  Chest x-rays dated 05/30/2019 and 05/26/2019 FINDINGS: Stable cardiomegaly. LEFT chest wall pacemaker apparatus appears stable. Again noted are the hazy/interstitial airspace opacities bilaterally, not significantly changed compared to the most recent exams, and chronic central pulmonary vascular congestion, suggesting a chronic CHF. No new lung findings. No pneumothorax seen. Osseous structures about the chest are unremarkable. IMPRESSION: Probable chronic CHF. No acute findings. No evidence of superimposed pneumonia. Electronically Signed   By: Bary Richard M.D.   On: 07/02/2019 20:47    Procedures Procedures (including critical care time)  Medications Ordered in ED Medications  ipratropium-albuterol (DUONEB) 0.5-2.5 (3) MG/3ML nebulizer solution 3 mL (has no administration in time range)  dexamethasone (DECADRON) injection 10 mg (has no administration in time range)  furosemide (LASIX) injection 20 mg (has no administration in time range)    ED Course  I have reviewed the triage vital signs and the nursing notes.  Pertinent labs & imaging results that were available during my care of the patient were reviewed by me and considered in my medical decision making (see chart for details).    MDM Rules/Calculators/A&P                     74 year old male with chronic medical conditions listed above, chronically ill, presenting to the emergency department for increasing shortness of breath, as well as aspiration event and vomiting earlier this evening.  History obtained by daughter who is POA, states that over the past week after discharge patient has become increasingly weak, fatigue as well as requiring more oxygen at home, was discharged on 2 L but has progressively gotten to 4 L, patient does have history of systolic heart  failure, could be contributing.  Per report, daughter states that when patient ambulates he drops down into the 70s over the past few days, tonight at dinner had an episode where he vomited and then was coughing, and had a desaturation episode.  She also states that patient was complaining of left lower abdominal pain and distention.  Patient has a history of dementia, history is limited from him, however the POA states that this is different for him.  On arrival patient was tachypneic in the 30s, hypoxic on 4 L nasal cannula to 87%, placed on 6 L with improvement.  Patient with some slight wheezing bilaterally, given DuoNeb's, could be CHF as well with element of reactive disease as well given his history of COPD.  Also likely element of aspiration, could be aspiration pneumonitis that is affecting patient currently.  However given patient's  recent deoxygenation, as well as history of fatigue and shortness of breath, and recent history of Covid and admission, PE study was obtained.  Also given his history of abdominal pain, patient is tender on exam as well with some swelling noted to his abdomen, mildly distended, given his age, concern for possible etiologies could include mesenteric ischemia, abscess, diverticulitis, CT abdomen obtained as well.  Holding on antibiotics at this time, patient afebrile and otherwise not hypotensive, patient given Decadron as well.  Chest x-ray with some multifocal opacities that seem to be his baseline from his last admission, post viral syndrome could also be contributing.  EKG has baseline, paced rhythm, no signs of acute ischemia.  Will obtain troponin, basic labs as well.  Pending at this time.  Given his new oxygen requirement as well as his continued mild tachypnea, patient need be admitted to the hospital for at least overnight observation and telemetry.  I explained this to the patient's POA, and she agreed.  Care transferred to oncoming provider at 11:30pm. See their  note for further disposition and care.   The attending physician was present and available for all medical decision making and procedures related to this patient's care.  Final Clinical Impression(s) / ED Diagnoses Final diagnoses:  COPD exacerbation (HCC)  Shortness of breath  Vomiting without nausea, intractability of vomiting not specified, unspecified vomiting type    Rx / DC Orders ED Discharge Orders    None       Jamey Reas, MD 07/02/19 [id number]    Blane Ohara, MD 07/04/19 2217

## 2019-07-02 NOTE — ED Notes (Signed)
[phone] [name] pts daughter, POA -update   States pt was vomiting before, BP 229/181, Pulse 93, o2 in the 60's, turned pts oxygen to 5L, pt complaining of lower abd hurting, pt had covid in Dec. Just came home a week and two days total

## 2019-07-03 ENCOUNTER — Observation Stay (HOSPITAL_BASED_OUTPATIENT_CLINIC_OR_DEPARTMENT_OTHER): Payer: Medicare Other

## 2019-07-03 ENCOUNTER — Encounter (HOSPITAL_COMMUNITY): Payer: Self-pay | Admitting: Radiology

## 2019-07-03 ENCOUNTER — Emergency Department (HOSPITAL_COMMUNITY): Payer: Medicare Other

## 2019-07-03 ENCOUNTER — Encounter: Payer: Self-pay | Admitting: Neurology

## 2019-07-03 ENCOUNTER — Telehealth: Payer: Self-pay

## 2019-07-03 DIAGNOSIS — Z66 Do not resuscitate: Secondary | ICD-10-CM | POA: Diagnosis present

## 2019-07-03 DIAGNOSIS — Z6826 Body mass index (BMI) 26.0-26.9, adult: Secondary | ICD-10-CM | POA: Diagnosis not present

## 2019-07-03 DIAGNOSIS — K8020 Calculus of gallbladder without cholecystitis without obstruction: Secondary | ICD-10-CM | POA: Diagnosis not present

## 2019-07-03 DIAGNOSIS — K469 Unspecified abdominal hernia without obstruction or gangrene: Secondary | ICD-10-CM | POA: Diagnosis present

## 2019-07-03 DIAGNOSIS — K439 Ventral hernia without obstruction or gangrene: Secondary | ICD-10-CM | POA: Diagnosis not present

## 2019-07-03 DIAGNOSIS — I351 Nonrheumatic aortic (valve) insufficiency: Secondary | ICD-10-CM | POA: Diagnosis not present

## 2019-07-03 DIAGNOSIS — I509 Heart failure, unspecified: Secondary | ICD-10-CM | POA: Diagnosis not present

## 2019-07-03 DIAGNOSIS — E1165 Type 2 diabetes mellitus with hyperglycemia: Secondary | ICD-10-CM | POA: Diagnosis present

## 2019-07-03 DIAGNOSIS — E43 Unspecified severe protein-calorie malnutrition: Secondary | ICD-10-CM | POA: Diagnosis present

## 2019-07-03 DIAGNOSIS — I482 Chronic atrial fibrillation, unspecified: Secondary | ICD-10-CM | POA: Diagnosis not present

## 2019-07-03 DIAGNOSIS — I342 Nonrheumatic mitral (valve) stenosis: Secondary | ICD-10-CM | POA: Diagnosis not present

## 2019-07-03 DIAGNOSIS — F17210 Nicotine dependence, cigarettes, uncomplicated: Secondary | ICD-10-CM | POA: Diagnosis present

## 2019-07-03 DIAGNOSIS — I5022 Chronic systolic (congestive) heart failure: Secondary | ICD-10-CM | POA: Diagnosis not present

## 2019-07-03 DIAGNOSIS — Z7189 Other specified counseling: Secondary | ICD-10-CM | POA: Diagnosis not present

## 2019-07-03 DIAGNOSIS — I2510 Atherosclerotic heart disease of native coronary artery without angina pectoris: Secondary | ICD-10-CM | POA: Diagnosis present

## 2019-07-03 DIAGNOSIS — J9611 Chronic respiratory failure with hypoxia: Secondary | ICD-10-CM | POA: Diagnosis not present

## 2019-07-03 DIAGNOSIS — J690 Pneumonitis due to inhalation of food and vomit: Principal | ICD-10-CM

## 2019-07-03 DIAGNOSIS — F0390 Unspecified dementia without behavioral disturbance: Secondary | ICD-10-CM | POA: Diagnosis present

## 2019-07-03 DIAGNOSIS — K7290 Hepatic failure, unspecified without coma: Secondary | ICD-10-CM | POA: Diagnosis present

## 2019-07-03 DIAGNOSIS — J9601 Acute respiratory failure with hypoxia: Secondary | ICD-10-CM | POA: Diagnosis not present

## 2019-07-03 DIAGNOSIS — N1831 Chronic kidney disease, stage 3a: Secondary | ICD-10-CM | POA: Diagnosis present

## 2019-07-03 DIAGNOSIS — E1122 Type 2 diabetes mellitus with diabetic chronic kidney disease: Secondary | ICD-10-CM | POA: Diagnosis present

## 2019-07-03 DIAGNOSIS — Z9981 Dependence on supplemental oxygen: Secondary | ICD-10-CM | POA: Diagnosis not present

## 2019-07-03 DIAGNOSIS — J9621 Acute and chronic respiratory failure with hypoxia: Secondary | ICD-10-CM | POA: Diagnosis not present

## 2019-07-03 DIAGNOSIS — N179 Acute kidney failure, unspecified: Secondary | ICD-10-CM | POA: Diagnosis present

## 2019-07-03 DIAGNOSIS — I340 Nonrheumatic mitral (valve) insufficiency: Secondary | ICD-10-CM

## 2019-07-03 DIAGNOSIS — R05 Cough: Secondary | ICD-10-CM | POA: Diagnosis not present

## 2019-07-03 DIAGNOSIS — Z515 Encounter for palliative care: Secondary | ICD-10-CM | POA: Diagnosis present

## 2019-07-03 DIAGNOSIS — E785 Hyperlipidemia, unspecified: Secondary | ICD-10-CM | POA: Diagnosis present

## 2019-07-03 DIAGNOSIS — J9622 Acute and chronic respiratory failure with hypercapnia: Secondary | ICD-10-CM | POA: Diagnosis present

## 2019-07-03 DIAGNOSIS — G4733 Obstructive sleep apnea (adult) (pediatric): Secondary | ICD-10-CM | POA: Diagnosis present

## 2019-07-03 DIAGNOSIS — I130 Hypertensive heart and chronic kidney disease with heart failure and stage 1 through stage 4 chronic kidney disease, or unspecified chronic kidney disease: Secondary | ICD-10-CM | POA: Diagnosis present

## 2019-07-03 DIAGNOSIS — I110 Hypertensive heart disease with heart failure: Secondary | ICD-10-CM | POA: Diagnosis not present

## 2019-07-03 DIAGNOSIS — Z8616 Personal history of COVID-19: Secondary | ICD-10-CM | POA: Diagnosis not present

## 2019-07-03 DIAGNOSIS — K5730 Diverticulosis of large intestine without perforation or abscess without bleeding: Secondary | ICD-10-CM | POA: Diagnosis not present

## 2019-07-03 DIAGNOSIS — E875 Hyperkalemia: Secondary | ICD-10-CM | POA: Diagnosis not present

## 2019-07-03 LAB — CBC WITH DIFFERENTIAL/PLATELET
Abs Immature Granulocytes: 0.17 10*3/uL — ABNORMAL HIGH (ref 0.00–0.07)
Basophils Absolute: 0.1 10*3/uL (ref 0.0–0.1)
Basophils Relative: 1 %
Eosinophils Absolute: 0.2 10*3/uL (ref 0.0–0.5)
Eosinophils Relative: 2 %
HCT: 31.5 % — ABNORMAL LOW (ref 39.0–52.0)
Hemoglobin: 9.6 g/dL — ABNORMAL LOW (ref 13.0–17.0)
Immature Granulocytes: 1 %
Lymphocytes Relative: 10 %
Lymphs Abs: 1.3 10*3/uL (ref 0.7–4.0)
MCH: 29 pg (ref 26.0–34.0)
MCHC: 30.5 g/dL (ref 30.0–36.0)
MCV: 95.2 fL (ref 80.0–100.0)
Monocytes Absolute: 1.5 10*3/uL — ABNORMAL HIGH (ref 0.1–1.0)
Monocytes Relative: 12 %
Neutro Abs: 9.8 10*3/uL — ABNORMAL HIGH (ref 1.7–7.7)
Neutrophils Relative %: 74 %
Platelets: 242 10*3/uL (ref 150–400)
RBC: 3.31 MIL/uL — ABNORMAL LOW (ref 4.22–5.81)
RDW: 15 % (ref 11.5–15.5)
WBC: 13.1 10*3/uL — ABNORMAL HIGH (ref 4.0–10.5)
nRBC: 0 % (ref 0.0–0.2)

## 2019-07-03 LAB — LACTIC ACID, PLASMA: Lactic Acid, Venous: 1 mmol/L (ref 0.5–1.9)

## 2019-07-03 LAB — COMPREHENSIVE METABOLIC PANEL
ALT: 29 U/L (ref 0–44)
ALT: 34 U/L (ref 0–44)
AST: 33 U/L (ref 15–41)
AST: 33 U/L (ref 15–41)
Albumin: 2.8 g/dL — ABNORMAL LOW (ref 3.5–5.0)
Albumin: 3.3 g/dL — ABNORMAL LOW (ref 3.5–5.0)
Alkaline Phosphatase: 84 U/L (ref 38–126)
Alkaline Phosphatase: 100 U/L (ref 38–126)
Anion gap: 9 (ref 5–15)
Anion gap: 10 (ref 5–15)
BUN: 31 mg/dL — ABNORMAL HIGH (ref 8–23)
BUN: 32 mg/dL — ABNORMAL HIGH (ref 8–23)
CO2: 32 mmol/L (ref 22–32)
CO2: 34 mmol/L — ABNORMAL HIGH (ref 22–32)
Calcium: 8.3 mg/dL — ABNORMAL LOW (ref 8.9–10.3)
Calcium: 9.1 mg/dL (ref 8.9–10.3)
Chloride: 100 mmol/L (ref 98–111)
Chloride: 94 mmol/L — ABNORMAL LOW (ref 98–111)
Creatinine, Ser: 1.28 mg/dL — ABNORMAL HIGH (ref 0.61–1.24)
Creatinine, Ser: 1.42 mg/dL — ABNORMAL HIGH (ref 0.61–1.24)
GFR calc Af Amer: >60 mL/min (ref >60)
GFR calc Af Amer: 57 mL/min — ABNORMAL LOW (ref >60)
GFR calc non Af Amer: 56 mL/min — ABNORMAL LOW (ref >60)
GFR calc non Af Amer: 49 mL/min — ABNORMAL LOW (ref >60)
Glucose, Bld: 178 mg/dL — ABNORMAL HIGH (ref 70–99)
Glucose, Bld: 257 mg/dL — ABNORMAL HIGH (ref 70–99)
Potassium: 4.2 mmol/L (ref 3.5–5.1)
Potassium: 6.1 mmol/L — ABNORMAL HIGH (ref 3.5–5.1)
Sodium: 141 mmol/L (ref 135–145)
Sodium: 138 mmol/L (ref 135–145)
Total Bilirubin: 0.5 mg/dL (ref 0.3–1.2)
Total Bilirubin: 0.3 mg/dL (ref 0.3–1.2)
Total Protein: 6.2 g/dL — ABNORMAL LOW (ref 6.5–8.1)
Total Protein: 7.4 g/dL (ref 6.5–8.1)

## 2019-07-03 LAB — TROPONIN I (HIGH SENSITIVITY)
Troponin I (High Sensitivity): 39 ng/L — ABNORMAL HIGH (ref <18)
Troponin I (High Sensitivity): 40 ng/L — ABNORMAL HIGH (ref <18)

## 2019-07-03 LAB — BLOOD GAS, ARTERIAL
Acid-Base Excess: 7.3 mmol/L — ABNORMAL HIGH (ref 0.0–2.0)
Bicarbonate: 33.2 mmol/L — ABNORMAL HIGH (ref 20.0–28.0)
FIO2: 36
O2 Saturation: 98.6 %
Patient temperature: 36.6
pCO2 arterial: 63.9 mmHg — ABNORMAL HIGH (ref 32.0–48.0)
pH, Arterial: 7.333 — ABNORMAL LOW (ref 7.350–7.450)
pO2, Arterial: 123 mmHg — ABNORMAL HIGH (ref 83.0–108.0)

## 2019-07-03 LAB — BASIC METABOLIC PANEL
Anion gap: 14 (ref 5–15)
BUN: 33 mg/dL — ABNORMAL HIGH (ref 8–23)
CO2: 31 mmol/L (ref 22–32)
Calcium: 9.3 mg/dL (ref 8.9–10.3)
Chloride: 93 mmol/L — ABNORMAL LOW (ref 98–111)
Creatinine, Ser: 1.56 mg/dL — ABNORMAL HIGH (ref 0.61–1.24)
GFR calc Af Amer: 51 mL/min — ABNORMAL LOW (ref >60)
GFR calc non Af Amer: 44 mL/min — ABNORMAL LOW (ref >60)
Glucose, Bld: 294 mg/dL — ABNORMAL HIGH (ref 70–99)
Potassium: 4.8 mmol/L (ref 3.5–5.1)
Sodium: 138 mmol/L (ref 135–145)

## 2019-07-03 LAB — CBC
HCT: 36 % — ABNORMAL LOW (ref 39.0–52.0)
Hemoglobin: 10.8 g/dL — ABNORMAL LOW (ref 13.0–17.0)
MCH: 28.5 pg (ref 26.0–34.0)
MCHC: 30 g/dL (ref 30.0–36.0)
MCV: 95 fL (ref 80.0–100.0)
Platelets: 269 10*3/uL (ref 150–400)
RBC: 3.79 MIL/uL — ABNORMAL LOW (ref 4.22–5.81)
RDW: 15.1 % (ref 11.5–15.5)
WBC: 17.4 10*3/uL — ABNORMAL HIGH (ref 4.0–10.5)
nRBC: 0 % (ref 0.0–0.2)

## 2019-07-03 LAB — ECHOCARDIOGRAM LIMITED

## 2019-07-03 LAB — BRAIN NATRIURETIC PEPTIDE: B Natriuretic Peptide: 231 pg/mL — ABNORMAL HIGH (ref 0.0–100.0)

## 2019-07-03 LAB — CBG MONITORING, ED
Glucose-Capillary: 238 mg/dL — ABNORMAL HIGH (ref 70–99)
Glucose-Capillary: 240 mg/dL — ABNORMAL HIGH (ref 70–99)
Glucose-Capillary: 240 mg/dL — ABNORMAL HIGH (ref 70–99)

## 2019-07-03 LAB — POC SARS CORONAVIRUS 2 AG -  ED: SARS Coronavirus 2 Ag: NEGATIVE

## 2019-07-03 LAB — PROTIME-INR
INR: 1.5 — ABNORMAL HIGH (ref 0.8–1.2)
Prothrombin Time: 17.9 seconds — ABNORMAL HIGH (ref 11.4–15.2)

## 2019-07-03 LAB — GLUCOSE, CAPILLARY
Glucose-Capillary: 260 mg/dL — ABNORMAL HIGH (ref 70–99)
Glucose-Capillary: 316 mg/dL — ABNORMAL HIGH (ref 70–99)

## 2019-07-03 MED ORDER — DIGOXIN 125 MCG PO TABS
0.1250 mg | ORAL_TABLET | Freq: Every day | ORAL | Status: DC
  Administered 2019-07-03 – 2019-07-04 (×2): 0.125 mg via ORAL
  Filled 2019-07-03 (×3): qty 1

## 2019-07-03 MED ORDER — LACTULOSE 10 GM/15ML PO SOLN
30.0000 g | Freq: Every day | ORAL | Status: DC
  Administered 2019-07-03 – 2019-07-05 (×3): 30 g via ORAL
  Filled 2019-07-03: qty 45
  Filled 2019-07-03: qty 60
  Filled 2019-07-03: qty 45

## 2019-07-03 MED ORDER — IOHEXOL 350 MG/ML SOLN
100.0000 mL | Freq: Once | INTRAVENOUS | Status: AC | PRN
  Administered 2019-07-03: 100 mL via INTRAVENOUS

## 2019-07-03 MED ORDER — ATORVASTATIN CALCIUM 40 MG PO TABS
40.0000 mg | ORAL_TABLET | Freq: Every evening | ORAL | Status: DC
  Administered 2019-07-03 – 2019-07-04 (×2): 40 mg via ORAL
  Filled 2019-07-03 (×2): qty 1

## 2019-07-03 MED ORDER — INSULIN ASPART 100 UNIT/ML ~~LOC~~ SOLN: 0.0000 [IU] | Freq: Every day | SUBCUTANEOUS | Status: DC

## 2019-07-03 MED ORDER — PIPERACILLIN-TAZOBACTAM 3.375 G IVPB
3.3750 g | Freq: Three times a day (TID) | INTRAVENOUS | Status: DC
  Administered 2019-07-03 – 2019-07-05 (×7): 3.375 g via INTRAVENOUS
  Filled 2019-07-03 (×9): qty 50

## 2019-07-03 MED ORDER — SODIUM CHLORIDE 0.9 % IV SOLN: 250.0000 mL | INTRAVENOUS | Status: DC | PRN

## 2019-07-03 MED ORDER — ARFORMOTEROL TARTRATE 15 MCG/2ML IN NEBU
15.0000 ug | INHALATION_SOLUTION | Freq: Two times a day (BID) | RESPIRATORY_TRACT | Status: DC
  Administered 2019-07-03 – 2019-07-05 (×5): 15 ug via RESPIRATORY_TRACT
  Filled 2019-07-03 (×6): qty 2

## 2019-07-03 MED ORDER — ADULT MULTIVITAMIN W/MINERALS CH
1.0000 | ORAL_TABLET | Freq: Every day | ORAL | Status: DC
  Administered 2019-07-03 – 2019-07-04 (×2): 1 via ORAL
  Filled 2019-07-03 (×2): qty 1

## 2019-07-03 MED ORDER — SODIUM CHLORIDE 0.9% FLUSH: 3.0000 mL | INTRAVENOUS | Status: DC | PRN

## 2019-07-03 MED ORDER — ACETAMINOPHEN 325 MG PO TABS
650.0000 mg | ORAL_TABLET | Freq: Four times a day (QID) | ORAL | Status: DC | PRN
  Administered 2019-07-04: 650 mg via ORAL
  Filled 2019-07-03: qty 2

## 2019-07-03 MED ORDER — SODIUM CHLORIDE 0.9% FLUSH
3.0000 mL | Freq: Two times a day (BID) | INTRAVENOUS | Status: DC
  Administered 2019-07-03 – 2019-07-05 (×4): 3 mL via INTRAVENOUS

## 2019-07-03 MED ORDER — THIAMINE HCL 100 MG PO TABS
100.0000 mg | ORAL_TABLET | Freq: Every day | ORAL | Status: DC
  Administered 2019-07-03 – 2019-07-04 (×2): 100 mg via ORAL
  Filled 2019-07-03 (×2): qty 1

## 2019-07-03 MED ORDER — ACETAMINOPHEN 650 MG RE SUPP: 650.0000 mg | Freq: Four times a day (QID) | RECTAL | Status: DC | PRN

## 2019-07-03 MED ORDER — INSULIN ASPART 100 UNIT/ML ~~LOC~~ SOLN: 0.0000 [IU] | Freq: Three times a day (TID) | SUBCUTANEOUS | Status: DC

## 2019-07-03 MED ORDER — INSULIN ASPART 100 UNIT/ML ~~LOC~~ SOLN
0.0000 [IU] | SUBCUTANEOUS | Status: DC
  Administered 2019-07-03 (×2): 3 [IU] via SUBCUTANEOUS
  Administered 2019-07-03: 5 [IU] via SUBCUTANEOUS
  Administered 2019-07-03: 7 [IU] via SUBCUTANEOUS
  Administered 2019-07-03: 2 [IU] via SUBCUTANEOUS
  Administered 2019-07-04: 7 [IU] via SUBCUTANEOUS
  Administered 2019-07-04 (×2): 3 [IU] via SUBCUTANEOUS
  Administered 2019-07-04: 5 [IU] via SUBCUTANEOUS
  Administered 2019-07-04 – 2019-07-05 (×3): 2 [IU] via SUBCUTANEOUS
  Administered 2019-07-05: 3 [IU] via SUBCUTANEOUS
  Administered 2019-07-05: 2 [IU] via SUBCUTANEOUS

## 2019-07-03 MED ORDER — BUDESONIDE 0.5 MG/2ML IN SUSP
0.5000 mg | Freq: Two times a day (BID) | RESPIRATORY_TRACT | Status: DC
  Administered 2019-07-03 – 2019-07-05 (×4): 0.5 mg via RESPIRATORY_TRACT
  Filled 2019-07-03 (×4): qty 2

## 2019-07-03 MED ORDER — BUDESONIDE 0.5 MG/2ML IN SUSP
0.5000 mg | Freq: Two times a day (BID) | RESPIRATORY_TRACT | Status: DC
  Administered 2019-07-03: 0.5 mg via RESPIRATORY_TRACT
  Filled 2019-07-03 (×2): qty 2

## 2019-07-03 MED ORDER — TORSEMIDE 20 MG PO TABS
80.0000 mg | ORAL_TABLET | Freq: Every day | ORAL | Status: DC
  Administered 2019-07-03: 80 mg via ORAL
  Filled 2019-07-03: qty 4

## 2019-07-03 MED ORDER — APIXABAN 5 MG PO TABS
5.0000 mg | ORAL_TABLET | Freq: Two times a day (BID) | ORAL | Status: DC
  Administered 2019-07-03 – 2019-07-05 (×5): 5 mg via ORAL
  Filled 2019-07-03 (×7): qty 1

## 2019-07-03 MED ORDER — SODIUM CHLORIDE 0.9 % IV SOLN: INTRAVENOUS | Status: AC

## 2019-07-03 NOTE — Evaluation (Signed)
Clinical/Bedside Swallow Evaluation Patient Details  Name: [name] MRN: [id number] Date of Birth: 05-Mar-1946  Today's Date: 07/03/2019 Time: SLP Start Time (ACUTE ONLY): 1340 SLP Stop Time (ACUTE ONLY): 1410 SLP Time Calculation (min) (ACUTE ONLY): 30 min  Past Medical History:  Past Medical History:  Diagnosis Date  . AICD (automatic cardioverter/defibrillator) present   . Alcohol abuse   . Anxiety   . Bipolar disorder (HCC)   . Biventricular automatic implantable cardioverter defibrillator in situ    a. 01/2014 s/p MDT [id number] Viva XT CRT-D (ser # [id number]).  . CAD (coronary artery disease)    a. s/p prior PCI/stenting of the LCX;  b. 08/2014 MV: EF 20%, large septal, apical, and inferior infarct from apex to base, no ischemia;  c. 08/2014 NSTEMI/Cath: LM mod distal dzs extending into ostial LCX (80%), LAD tortuous, RI nl, OM mod dzs, RCA 100 CTO with R->R and L->R collats-->Med Rx.  . CHF (congestive heart failure) (HCC)   . Chronic atrial fibrillation (HCC)    a. CHA2DS2VASc = 6->eliquis;  b. S/P AVN RFCA an BiV ICD placement.  . Chronic renal insufficiency, stage 3 (moderate) 2020   GFR low 40s (estimated baseline sCr 1.5)  . Chronic respiratory failure (HCC)    hypoxic and hypercarbic  . Chronic systolic congestive heart failure (HCC)    a. 01/2014 Echo: EF 25-30%, mod conc LVH, mod dil LA.  Marland Kitchen Complete heart block (HCC)    a. In setting of prior AV nodal ablation r/t afib-->BiV ICD (01/2014).  . COPD (chronic obstructive pulmonary disease) (HCC)   . COVID-19 virus infection 05/26/2019   hosp + rehab  . CVA (cerebral vascular accident) (HCC)    a. Multiple prior embolic strokes.  . Dementia (HCC)    suspect vascular  . Depression   . Diabetes mellitus (HCC)   . DVT (deep venous thrombosis) (HCC)   . Hepatic steatosis    w/out imaging evidence of cirrhosis on u/s or CT scan (as of 01/2019).  +hx of elevated ammonia.  . Hypertension   . Mitral valve disease    a. remote  mitral replacement with Bjork Shiley valve;  b. 06/2006 Redo MVR with tissue valve.  . Mixed Ischemic and Nonischemic Cardiomyopathy    a. 8/.2015 Echo: Ef 25-30%;  b. 01/2014 s/p MDT [id number] Talbot Grumbling XT CRT-D (ser # [id number]).  . On home oxygen therapy    "2L; 24/7" (65/16/2019)  . Psoriasis    "back of his head; elbows; finger of left hand" (06/07/2017)  . Pure hypercholesterolemia   . S/P AV nodal ablation   . Seborrheic dermatitis   . Tobacco dependence    current as of 07/2018  . Ventral hernia    Past Surgical History:  Past Surgical History:  Procedure Laterality Date  . AV NODE ABLATION  2007  . CARDIAC CATHETERIZATION  06/2006   Mild ostial L main stenosis, CFX stent patent, RCA occluded (old)  . CORONARY ARTERY BYPASS GRAFT  2008  . CYSTOSCOPY W/ URETERAL STENT PLACEMENT  07/12/2011   Procedure: CYSTOSCOPY WITH RETROGRADE PYELOGRAM/URETERAL STENT PLACEMENT;  Surgeon: Sebastian Ache, MD;  Location: [facility] OR;  Service: Urology;  Laterality: Left;  . ECHO DOPPLER COMPLETE(TRANSTHOR) ([facility] HX)  04/07/2017   LV EF: 25%-30%  . EEG  2019 and 12/22/18   2019 abnl->no seizures, not on seizure meds (as of 01/2019).  Rpt EEG 12/2018 no epileptiform activity.  . EXPLORATORY LAPAROTOMY    . HERNIA REPAIR    .  ICD GENERATOR CHANGE  02/01/2014   Gen change to: Medtronic VIVA pulse generator, serial number [id number]  . INSERT / REPLACE / REMOVE PACEMAKER    . LEFT HEART CATHETERIZATION WITH CORONARY ANGIOGRAM N/A 08/23/2014   Procedure: LEFT HEART CATHETERIZATION WITH CORONARY ANGIOGRAM;  Surgeon: Corky Crafts, MD;  Location: [facility] CATH LAB;  Service: Cardiovascular;  Laterality: N/A;  . MITRAL VALVE REPLACEMENT     remote [facility] valve 4098 with redo tissue valve 06/2006  . PACEMAKER GENERATOR CHANGE N/A 02/01/2014   Procedure: PACEMAKER GENERATOR CHANGE;  Surgeon: Duke Salvia, MD;  Location: [facility] CATH LAB;  Service: Cardiovascular;  Laterality: N/A;  . PACEMAKER PLACEMENT  2006   Changed  to CRT-D in 2009  . RIGHT/LEFT HEART CATH AND CORONARY ANGIOGRAPHY N/A 06/09/2017   Procedure: RIGHT/LEFT HEART CATH AND CORONARY ANGIOGRAPHY;  Surgeon: [name], MD;  Location: [facility] INVASIVE CV LAB;  Service: Cardiovascular;  Laterality: N/A;  . TRANSTHORACIC ECHOCARDIOGRAM  10/2017   EF 30-35%, inf-lat and inf akin, MV bioprosth ok, mod dec RV fxn, pulm HTN (peak 65).  . VASECTOMY     HPI:  Dementia, Psoriasis,  hypertension, hyperlipidemia, Dm2, CKD stage3, CAD, s/p DES LCx, Chronic Afib, Chronic systolic CHF(EF 30%), s/p AICD,  bioprosthetic MVR, Copd (gold 3) on home o2, 2-4L [place], OSA prev on CPAP, Covid-19 admission 05/28/19, presents due to choking on some food per ED notes but daughter states was choking up on his mucous (milky white-brown), 1 episode of n/v, . CXR = No acute findings. No evidence of superimposed pneumonia.   Assessment / Plan / Recommendation Clinical Impression  Pt presents with adequate oral motor strength and function. He had a MBS in May 2019, and was placed on soft solids and nectar thick liquids. He has no recollection of this test or recommendation, but reports no difficulty with swallowing recently, drinking water and eating "whatever (he) can get". Pt has upper and lower dentures, which appear to fit well. No overt s/s aspiration observed on trials of thin liquid, puree, or solid textures. Recommend dys 3 diet with chopped meats (for energy conservation), thin liquids, whole meds. No further ST intervention recommended at this time. Please reconsult if needs arise.    SLP Visit Diagnosis: Dysphagia, unspecified (R13.10)    Aspiration Risk  Mild aspiration risk    Diet Recommendation Dysphagia 3 (Mech soft);Thin liquid   Liquid Administration via: Cup;Straw Medication Administration: Whole meds with liquid Supervision: Patient able to self feed;Intermittent supervision to cue for compensatory strategies Compensations: Minimize environmental  distractions;Slow rate;Small sips/bites Postural Changes: Seated upright at 90 degrees    Other  Recommendations Oral Care Recommendations: Oral care BID Other Recommendations: Clarify dietary restrictions   Follow up Recommendations 24 hour supervision/assistance          Prognosis Prognosis for Safe Diet Advancement: Good      Swallow Study   General Date of Onset: 07/02/19 HPI: Dementia, Psoriasis,  hypertension, hyperlipidemia, Dm2, CKD stage3, CAD, s/p DES LCx, Chronic Afib, Chronic systolic CHF(EF 30%), s/p AICD,  bioprosthetic MVR, Copd (gold 3) on home o2, 2-4L [place], OSA prev on CPAP, Covid-19 admission 05/28/19, presents due to choking on some food per ED notes but daughter states was choking up on his mucous (milky white-brown), 1 episode of n/v, . CXR = No acute findings. No evidence of superimposed pneumonia. Type of Study: Bedside Swallow Evaluation Previous Swallow Assessment: MBS May 2019 - nectar thick liquids Diet Prior to this Study:  NPO Temperature Spikes Noted: No Respiratory Status: Nasal cannula History of Recent Intubation: No Behavior/Cognition: Alert;Cooperative;Pleasant mood(HOH) Oral Cavity Assessment: Within Functional Limits Oral Care Completed by SLP: Yes Oral Cavity - Dentition: Dentures, top;Dentures, bottom Vision: Functional for self-feeding Self-Feeding Abilities: Able to feed self Patient Positioning: Upright in bed Baseline Vocal Quality: Normal Volitional Cough: Strong Volitional Swallow: Able to elicit    Oral/Motor/Sensory Function Overall Oral Motor/Sensory Function: Within functional limits   Ice Chips Ice chips: Not tested   Thin Liquid Thin Liquid: Within functional limits Presentation: Straw    Nectar Thick Nectar Thick Liquid: Not tested   Honey Thick Honey Thick Liquid: Not tested   Puree Puree: Within functional limits Presentation: Self Fed;Spoon   Solid     Solid: Within functional limits Presentation: [place] [facility]. Qu[facility] [place], [facility], [place] Speech Language Pathologist Office: [phone] Pager: [phone]   Shonna Chock 07/03/2019,2:16 PM

## 2019-07-03 NOTE — ED Notes (Addendum)
RN redrew blood and sent it to the lab to be run

## 2019-07-03 NOTE — Progress Notes (Addendum)
ABG results: Abnormal ABG results.  CO2 63.9. Pt asymptomatic on 4L [place]. Resting in bed with no complaints. A/O X 4. Paged Sherryll Burger, DO concerning abnormal lab value. Will continue to monitor.

## 2019-07-03 NOTE — Telephone Encounter (Signed)
Noted  

## 2019-07-03 NOTE — H&P (Addendum)
TRH H&P    Patient Demographics:    [name], is a 74 y.o. male  MRN: [id number]  DOB - Feb 26, 1946  Admit Date - 07/02/2019  Referring MD/NP/PA: Joline Maxcy  Outpatient Primary MD for the patient is McGowen, Adrian Blackwater, MD  Patient coming from:   home  Chief complaint-   Choking,    HPI:    [name]  is a 74 y.o. male, w Dementia, Psoriasis,  hypertension, hyperlipidemia, Dm2, CKD stage3, CAD, s/p DES LCx, Chronic Afib, Chronic systolic CHF(EF 94%), s/p AICD,  bioprosthetic MVR, Copd (gold 3) on home o2, 2-4L [place], OSA prev on CPAP, Covid-19 admission 05/28/19, presents due to choking on some food per ED notes but daughter states was choking up on his mucous (milky white-brown), 1 episode of n/v, . White.  Pt 88% on 4L home o2 and increase to 98% when placed on NRB at 15L pt sent to ED for evaluation.    In Ed,  T 99.2, P 66, R 25, Bp 126/69 pox 96% on 15L NRB  CTA chest/ abd/ pelvis IMPRESSION: CTA CHEST  1. No evidence of acute pulmonary artery filling defect to suggest pulmonary embolus. 2. There is a partially calcified filling defect in the left atrium favoring a thrombus given interval decrease in size since September 2015 comparison CT abdomen pelvis. 3. Multifocal areas of ground-glass opacity and mixed airspace disease throughout both lungs. The more consolidative opacities have a somewhat peripheral appearance. Favor some residual infectious consolidation or residual opacity from patient's known COVID-19 diagnosis with superimposed edema/heart failure. 4. Mediastinal and hilar lymphadenopathy is likely reactive. 5. Cardiomegaly with predominantly left atrial ventricular enlargement and prior bioprosthetic mitral valve replacement. 6. Evidence of prior CABG with extensive coronary artery calcifications of the native vessels.  CT ABDOMEN AND PELVIS  1. No acute findings in the  abdomen or pelvis to explain the patient's symptoms. 2. Colonic diverticulosis without evidence of diverticulitis. 3. Richter's hernia containing portion of the anti mesenteric small bowel. No evidence of mechanical obstruction or vascular compromise. The herniated portion of bowel appears to correspond to a chronically patulous segment of small bowel with surgical material likely reflecting prior anastomosis.  4. Bilateral fat containing inguinal hernias. 5. Cholelithiasis without evidence of acute cholecystitis. Thickening at the tip of the gallbladder may reflect adenomyomatosis, could consider further evaluation with nonemergent, outpatient right upper quadrant ultrasound  Wbc 13.1, hgb 9.6, Plt 242 Na 141, K 4.2, Bun 31, Creatinine 1.28 Ast 33, Alt 29 INR 1.5 Lactic acid 2.3 PH 7.408, pCo2 61.1, po2 62  Trop 40-> 39  POC sars by ED-> negative   Pt received lasix 12m iv x1, and decadron 173miv x1 in ED.   Pt will be admitted for acute respiratory failure on chronic respiratory failure  with hypoxia and hypercapnea, secondary to aspiration pneumonia, vs worsening lung function secondary to covid-19 on Copd  Acute respiratory failure with hypoxia and hypercapnea Secondary to aspiration pneumonia ddx Chronic CHF, worsening lung function secondary to covid-19 on Copd ..Marland Kitchen  Review of systems:    In addition to the HPI above,  No Fever-chills, No Headache, No changes with Vision or hearing, No problems swallowing food or Liquids, No Chest pain,   No Abdominal pain, No Nausea or Vomiting, bowel movements are regular, No Blood in stool or Urine, No dysuria, No new skin rashes or bruises, No new joints pains-aches,  No new weakness, tingling, numbness in any extremity, No recent weight gain or loss, No polyuria, polydypsia or polyphagia, No significant Mental Stressors.  All other systems reviewed and are negative.    Past History of the following :    Past Medical  History:  Diagnosis Date  . AICD (automatic cardioverter/defibrillator) present   . Alcohol abuse   . Anxiety   . Bipolar disorder ([place])   . Biventricular automatic implantable cardioverter defibrillator in situ    a. 01/2014 s/p MDT [id number] Viva XT CRT-D (ser # [id number]).  . CAD (coronary artery disease)    a. s/p prior PCI/stenting of the LCX;  b. 08/2014 MV: EF 20%, large septal, apical, and inferior infarct from apex to base, no ischemia;  c. 08/2014 NSTEMI/Cath: LM mod distal dzs extending into ostial LCX (80%), LAD tortuous, RI nl, OM mod dzs, RCA 100 CTO with R->R and L->R collats-->Med Rx.  . CHF (congestive heart failure) ([place])   . Chronic atrial fibrillation (HCC)    a. CHA2DS2VASc = 6->eliquis;  b. S/P AVN RFCA an BiV ICD placement.  . Chronic renal insufficiency, stage 3 (moderate) 2020   GFR low 40s (estimated baseline sCr 1.5)  . Chronic respiratory failure (HCC)    hypoxic and hypercarbic  . Chronic systolic congestive heart failure ([place])    a. 01/2014 Echo: EF 25-30%, mod conc LVH, mod dil LA.  Marland Kitchen Complete heart block ([place])    a. In setting of prior AV nodal ablation r/t afib-->BiV ICD (01/2014).  . COPD (chronic obstructive pulmonary disease) ([place])   . COVID-19 virus infection 05/26/2019   hosp + rehab  . CVA (cerebral vascular accident) ([place])    a. Multiple prior embolic strokes.  . Dementia ([place])    suspect vascular  . Depression   . Diabetes mellitus ([place])   . DVT (deep venous thrombosis) ([place])   . Hepatic steatosis    w/out imaging evidence of cirrhosis on u/s or CT scan (as of 01/2019).  +hx of elevated ammonia.  . Hypertension   . Mitral valve disease    a. remote mitral replacement with Bjork Shiley valve;  b. 06/2006 Redo MVR with tissue valve.  . Mixed Ischemic and Nonischemic Cardiomyopathy    a. 8/.2015 Echo: Ef 25-30%;  b. 01/2014 s/p MDT [id number] Pershing Proud XT CRT-D (ser # [id number]).  . On home oxygen therapy    "2L; 24/7" (65/16/2019)  . Psoriasis    "back of his  head; elbows; finger of left hand" (06/07/2017)  . Pure hypercholesterolemia   . S/P AV nodal ablation   . Seborrheic dermatitis   . Tobacco dependence    current as of 07/2018  . Ventral hernia       Past Surgical History:  Procedure Laterality Date  . AV NODE ABLATION  2007  . CARDIAC CATHETERIZATION  06/2006   Mild ostial L main stenosis, CFX stent patent, RCA occluded (old)  . CORONARY ARTERY BYPASS GRAFT  2008  . CYSTOSCOPY W/ URETERAL STENT PLACEMENT  07/12/2011   Procedure: CYSTOSCOPY WITH RETROGRADE PYELOGRAM/URETERAL STENT PLACEMENT;  Surgeon: Alexis Frock, MD;  Location:  [facility] OR;  Service: Urology;  Laterality: Left;  . ECHO DOPPLER COMPLETE(TRANSTHOR) ([facility] HX)  04/07/2017   LV EF: 25%-30%  . EEG  2019 and 12/22/18   2019 abnl->no seizures, not on seizure meds (as of 01/2019).  Rpt EEG 12/2018 no epileptiform activity.  . EXPLORATORY LAPAROTOMY    . HERNIA REPAIR    . ICD GENERATOR CHANGE  02/01/2014   Gen change to: Medtronic VIVA pulse generator, serial number [id number]  . INSERT / REPLACE / REMOVE PACEMAKER    . LEFT HEART CATHETERIZATION WITH CORONARY ANGIOGRAM N/A 08/23/2014   Procedure: LEFT HEART CATHETERIZATION WITH CORONARY ANGIOGRAM;  Surgeon: Jettie Booze, MD;  Location: [facility] CATH LAB;  Service: Cardiovascular;  Laterality: N/A;  . MITRAL VALVE REPLACEMENT     remote [facility] valve [id number] with redo tissue valve 06/2006  . PACEMAKER GENERATOR CHANGE N/A 02/01/2014   Procedure: PACEMAKER GENERATOR CHANGE;  Surgeon: Deboraha Sprang, MD;  Location: [facility] CATH LAB;  Service: Cardiovascular;  Laterality: N/A;  . PACEMAKER PLACEMENT  2006   Changed to CRT-D in 2009  . RIGHT/LEFT HEART CATH AND CORONARY ANGIOGRAPHY N/A 06/09/2017   Procedure: RIGHT/LEFT HEART CATH AND CORONARY ANGIOGRAPHY;  Surgeon: Larey Dresser, MD;  Location: [facility] CV LAB;  Service: Cardiovascular;  Laterality: N/A;  . TRANSTHORACIC ECHOCARDIOGRAM  10/2017   EF 30-35%, inf-lat and inf akin, MV  bioprosth ok, mod dec RV fxn, pulm HTN (peak 65).  . VASECTOMY        Social History:      Social History   Tobacco Use  . Smoking status: Current Some Day Smoker    Packs/day: 2.00    Years: 45.00    Pack years: 90.00    Types: Cigarettes  . Smokeless tobacco: Never Used  . Tobacco comment: daughter states he cannot get to cigarettes now few cigarettes a day  Substance Use Topics  . Alcohol use: Not Currently    Alcohol/week: 21.0 standard drinks    Types: 21 Cans of beer per week    Comment: 10/21/2017 "~ 2-3 beers/day"       Family History :     Family History  Problem Relation Age of Onset  . Alzheimer's disease Mother   . CAD Mother   . Heart attack Father   . Heart disease Father   . CAD Father   . Heart attack Son   . Varicose Veins Son   . Deep vein thrombosis Son   . Heart disease Son   . Stroke Other   . Heart disease Other   . Heart disease Brother   . CAD Brother       Home Medications:   Prior to Admission medications   Medication Sig Start Date End Date Taking? Authorizing Provider  acetaminophen (TYLENOL) 325 MG tablet Take 2 tablets (650 mg total) by mouth every 6 (six) hours as needed for mild pain (or Fever >/= 101). 06/13/19   Cherene Altes, MD  arformoterol (BROVANA) 15 MCG/2ML NEBU Take 15 mcg by nebulization 2 (two) times daily.    [provider]  atorvastatin (LIPITOR) 40 MG tablet Take 1 tablet (40 mg total) by mouth daily at 6 PM. Patient taking differently: Take 40 mg by mouth every evening.  11/09/18   Larey Dresser, MD  budesonide (PULMICORT) 0.5 MG/2ML nebulizer solution Take 2 mLs (0.5 mg total) by nebulization 2 (two) times daily. DX: J44.9 06/13/18   Tanda Rockers, MD  Continuous  Blood Gluc Sensor (DEXCOM G6 SENSOR) MISC 1 each by Does not apply route 4 (four) times daily. Use to check  glucose 4 times a day. 06/27/19   McGowen, Adrian Blackwater, MD  digoxin (LANOXIN) 0.125 MG tablet Take 1 tablet (0.125 mg total) by mouth  daily. 11/09/18   Larey Dresser, MD  ELIQUIS 5 MG TABS tablet Take 1 tablet by mouth twice daily. 04/03/19   Larey Dresser, MD  glipiZIDE (GLUCOTROL XL) 2.5 MG 24 hr tablet Take 1 tablet (2.5 mg total) by mouth every morning. 06/30/19   McGowen, Adrian Blackwater, MD  HUMALOG KWIKPEN 100 UNIT/ML KwikPen Sliding scale 06/23/19   [provider]  insulin detemir (LEVEMIR) 100 UNIT/ML injection Inject 0.36 mLs (36 Units total) into the skin at bedtime. 06/23/19   McGowen, Adrian Blackwater, MD  Insulin Pen Needle 31G X 8 MM MISC Use to inject 36 U into skin once daily. 06/23/19   McGowen, Adrian Blackwater, MD  lactulose (CHRONULAC) 10 GM/15ML solution TAKE 30 MILLILITERS BY MOUTH TWICE A DAY Patient taking differently: Take 30 g by mouth daily.  04/12/18   Larey Dresser, MD  Multiple Vitamin (MULTIVITAMIN WITH MINERALS) TABS tablet Take 1 tablet by mouth daily. Patient taking differently: Take 1 tablet by mouth at bedtime.  05/24/15   Bonnielee Haff, MD  nitroGLYCERIN (NITROSTAT) 0.4 MG SL tablet Place 1 tablet (0.4 mg total) under the tongue every 5 (five) minutes as needed for chest pain. 11/26/17   Larey Dresser, MD  thiamine 100 MG tablet Take 1 tablet (100 mg total) by mouth daily. Patient taking differently: Take 100 mg by mouth at bedtime.  05/24/15   Bonnielee Haff, MD  torsemide (DEMADEX) 20 MG tablet Take 4 tablets (80 mg total) by mouth daily. 02/07/19   Larey Dresser, MD     Allergies:     Allergies  Allergen Reactions  . Warfarin Other (See Comments)    Non compliance and ETOH abuse     Physical Exam:   Vitals  Blood pressure (!) 115/56, pulse 61, temperature 99.2 F (37.3 C), temperature source Oral, resp. rate (!) 21, SpO2 98 %.  1.  General: Axoxo2(person, hospital, not year)  2. Psychiatric: euthymic  3. Neurologic: nonfocal  4. HEENMT:  Anicteric, pupils 1.53m symmetric, direct, consensual, near intact Neck: no jvd  5. Respiratory : Slight bibasilar crackles, no  wheezing  6. Cardiovascular : rrr s1, s2, no m/g/r, loud p2  7. Gastrointestinal:  Abd: ventral hernia, soft, nt, nd, +bs  8. Skin:  Ext: no c/c/e, no rash  9.Musculoskeletal:  Good ROM    Data Review:    CBC Recent Labs  Lab 07/02/19 2100 07/02/19 2110  WBC 13.1*  --   HGB 9.6* 10.9*  HCT 31.5* 32.0*  PLT 242  --   MCV 95.2  --   MCH 29.0  --   MCHC 30.5  --   RDW 15.0  --   LYMPHSABS 1.3  --   MONOABS 1.5*  --   EOSABS 0.2  --   BASOSABS 0.1  --    ------------------------------------------------------------------------------------------------------------------  Results for orders placed or performed during the hospital encounter of 07/02/19 (from the past 48 hour(s))  CBC with Differential     Status: Abnormal   Collection Time: 07/02/19  9:00 PM  Result Value Ref Range   WBC 13.1 (H) 4.0 - 10.5 K/uL   RBC 3.31 (L) 4.22 - 5.81 MIL/uL   Hemoglobin 9.6 (  L) 13.0 - 17.0 g/dL   HCT 31.5 (L) 39.0 - 52.0 %   MCV 95.2 80.0 - 100.0 fL   MCH 29.0 26.0 - 34.0 pg   MCHC 30.5 30.0 - 36.0 g/dL   RDW 15.0 11.5 - 15.5 %   Platelets 242 150 - 400 K/uL   nRBC 0.0 0.0 - 0.2 %   Neutrophils Relative % 74 %   Neutro Abs 9.8 (H) 1.7 - 7.7 K/uL   Lymphocytes Relative 10 %   Lymphs Abs 1.3 0.7 - 4.0 K/uL   Monocytes Relative 12 %   Monocytes Absolute 1.5 (H) 0.1 - 1.0 K/uL   Eosinophils Relative 2 %   Eosinophils Absolute 0.2 0.0 - 0.5 K/uL   Basophils Relative 1 %   Basophils Absolute 0.1 0.0 - 0.1 K/uL   Immature Granulocytes 1 %   Abs Immature Granulocytes 0.17 (H) 0.00 - 0.07 K/uL    Comment: Performed at [facility] [address], [place], [place] [address]  Comprehensive metabolic panel     Status: Abnormal   Collection Time: 07/02/19  9:00 PM  Result Value Ref Range   Sodium 141 135 - 145 mmol/L   Potassium 4.2 3.5 - 5.1 mmol/L   Chloride 100 98 - 111 mmol/L   CO2 32 22 - 32 mmol/L   Glucose, Bld 178 (H) 70 - 99 mg/dL   BUN 31 (H) 8 - 23 mg/dL    Creatinine, Ser 1.28 (H) 0.61 - 1.24 mg/dL   Calcium 8.3 (L) 8.9 - 10.3 mg/dL   Total Protein 6.2 (L) 6.5 - 8.1 g/dL   Albumin 2.8 (L) 3.5 - 5.0 g/dL   AST 33 15 - 41 U/L   ALT 29 0 - 44 U/L   Alkaline Phosphatase 84 38 - 126 U/L   Total Bilirubin 0.5 0.3 - 1.2 mg/dL   GFR calc non Af Amer 56 (L) >60 mL/min   GFR calc Af Amer >60 >60 mL/min   Anion gap 9 5 - 15    Comment: Performed at [facility], [place] [address] [place], [place] [address]  Troponin I (High Sensitivity)     Status: Abnormal   Collection Time: 07/02/19  9:00 PM  Result Value Ref Range   Troponin I (High Sensitivity) 40 (H) <18 ng/L    Comment: (NOTE) Elevated high sensitivity troponin I (hsTnI) values and significant  changes across serial measurements may suggest ACS but many other  chronic and acute conditions are known to elevate hsTnI results.  Refer to the "Links" section for chest pain algorithms and additional  guidance. Performed at [facility], [place] [address] [place], [place] [address]   Protime-INR     Status: Abnormal   Collection Time: 07/02/19  9:00 PM  Result Value Ref Range   Prothrombin Time 17.9 (H) 11.4 - 15.2 seconds   INR 1.5 (H) 0.8 - 1.2    Comment: (NOTE) INR goal varies based on device and disease states. Performed at [facility], [place] [address], [place], [place] [address]   Lactic acid, plasma     Status: Abnormal   Collection Time: 07/02/19  9:01 PM  Result Value Ref Range   Lactic Acid, Venous 2.3 (HH) 0.5 - 1.9 mmol/L    Comment: CRITICAL RESULT CALLED TO, READ BACK BY AND VERIFIED WITH: J RAMOS,RN 07/02/2019 [place] Performed at [facility], [place] [address] [place], [place] [address]   I-STAT  7, (LYTES, BLD GAS, ICA, H+H)     Status: Abnormal   Collection Time: 07/02/19  9:10 PM  Result Value Ref Range   pH, Arterial 7.408 7.350 - 7.450   pCO2 arterial 61.1 (H) 32.0 - 48.0 mmHg   pO2, Arterial 62.0 (L) 83.0 - 108.0 mmHg   Bicarbonate 38.4  (H) 20.0 - 28.0 mmol/L   TCO2 40 (H) 22 - 32 mmol/L   O2 Saturation 90.0 %   Acid-Base Excess 12.0 (H) 0.0 - 2.0 mmol/L   Sodium 139 135 - 145 mmol/L   Potassium 4.7 3.5 - 5.1 mmol/L   Calcium, Ion 1.19 1.15 - 1.40 mmol/L   HCT 32.0 (L) 39.0 - 52.0 %   Hemoglobin 10.9 (L) 13.0 - 17.0 g/dL   Patient temperature 99.2 F    Collection site RADIAL, ALLEN'S TEST ACCEPTABLE    Sample type ARTERIAL   Lactic acid, plasma     Status: None   Collection Time: 07/03/19 12:12 AM  Result Value Ref Range   Lactic Acid, Venous 1.0 0.5 - 1.9 mmol/L    Comment: Performed at [facility] [address], [place], [place] [address]  Troponin I (High Sensitivity)     Status: Abnormal   Collection Time: 07/03/19 12:12 AM  Result Value Ref Range   Troponin I (High Sensitivity) 39 (H) <18 ng/L    Comment: (NOTE) Elevated high sensitivity troponin I (hsTnI) values and significant  changes across serial measurements may suggest ACS but many other  chronic and acute conditions are known to elevate hsTnI results.  Refer to the "Links" section for chest pain algorithms and additional  guidance. Performed at [facility], [place] [address], [place], [place] [address]   POC SARS Coronavirus 2 Ag-ED - Nasal Swab (BD Veritor Kit)     Status: None   Collection Time: 07/03/19 12:52 AM  Result Value Ref Range   SARS Coronavirus 2 Ag NEGATIVE NEGATIVE    Comment: (NOTE) SARS-CoV-2 antigen NOT DETECTED.  Negative results are presumptive.  Negative results do not preclude SARS-CoV-2 infection and should not be used as the sole basis for treatment or other patient management decisions, including infection  control decisions, particularly in the presence of clinical signs and  symptoms consistent with COVID-19, or in those who have been in contact with the virus.  Negative results must be combined with clinical observations, patient history, and epidemiological information. The expected result is  Negative. Fact Sheet for Patients: PodPark.tn Fact Sheet for Healthcare Providers: GiftContent.is This test is not yet approved or cleared by the [place] FDA and  has been authorized for detection and/or diagnosis of SARS-CoV-2 by FDA under an Emergency Use Authorization (EUA).  This EUA will remain in effect (meaning this test can be used) for the duration of  the COVID-19 de claration under Section 564(b)(1) of the Act, 21 U.S.C. section 360bbb-3(b)(1), unless the authorization is terminated or revoked sooner.     Chemistries  Recent Labs  Lab 07/02/19 2100 07/02/19 2110  NA 141 139  K 4.2 4.7  CL 100  --   CO2 32  --   GLUCOSE 178*  --   BUN 31*  --   CREATININE 1.28*  --   CALCIUM 8.3*  --   AST 33  --   ALT 29  --   ALKPHOS 84  --   BILITOT 0.5  --    ------------------------------------------------------------------------------------------------------------------  ------------------------------------------------------------------------------------------------------------------ GFR: Estimated Creatinine Clearance: 41.8 mL/min (  A) (by C-G formula based on SCr of 1.28 mg/dL (H)). Liver Function Tests: Recent Labs  Lab 07/02/19 2100  AST 33  ALT 29  ALKPHOS 84  BILITOT 0.5  PROT 6.2*  ALBUMIN 2.8*   No results for input(s): LIPASE, AMYLASE in the last 168 hours. No results for input(s): AMMONIA in the last 168 hours. Coagulation Profile: Recent Labs  Lab 07/02/19 2100  INR 1.5*   Cardiac Enzymes: No results for input(s): CKTOTAL, CKMB, CKMBINDEX, TROPONINI in the last 168 hours. BNP (last 3 results) No results for input(s): PROBNP in the last 8760 hours. HbA1C: No results for input(s): HGBA1C in the last 72 hours. CBG: No results for input(s): GLUCAP in the last 168 hours. Lipid Profile: No results for input(s): CHOL, HDL, LDLCALC, TRIG, CHOLHDL, LDLDIRECT in the last 72 hours. Thyroid  Function Tests: No results for input(s): TSH, T4TOTAL, FREET4, T3FREE, THYROIDAB in the last 72 hours. Anemia Panel: No results for input(s): VITAMINB12, FOLATE, FERRITIN, TIBC, IRON, RETICCTPCT in the last 72 hours.  --------------------------------------------------------------------------------------------------------------- Urine analysis:    Component Value Date/Time   COLORURINE STRAW (A) 05/26/2019 1639   APPEARANCEUR CLEAR 05/26/2019 1639   LABSPEC 1.011 05/26/2019 1639   PHURINE 6.0 05/26/2019 1639   GLUCOSEU >=500 (A) 05/26/2019 1639   HGBUR SMALL (A) 05/26/2019 1639   BILIRUBINUR NEGATIVE 05/26/2019 1639   KETONESUR NEGATIVE 05/26/2019 1639   PROTEINUR NEGATIVE 05/26/2019 1639   UROBILINOGEN 0.2 12/24/2014 0311   NITRITE NEGATIVE 05/26/2019 1639   LEUKOCYTESUR NEGATIVE 05/26/2019 1639      Imaging Results:    CT Angio Chest PE W and/or Wo Contrast  Result Date: 07/03/2019 CLINICAL DATA:  COVID-19 positive, cough, sudden episode of emesis while eating EXAM: CT ANGIOGRAPHY CHEST CT ABDOMEN AND PELVIS WITH CONTRAST TECHNIQUE: Multidetector CT imaging of the chest was performed using the standard protocol during bolus administration of intravenous contrast. Multiplanar CT image reconstructions and MIPs were obtained to evaluate the vascular anatomy. Multidetector CT imaging of the abdomen and pelvis was performed using the standard protocol during bolus administration of intravenous contrast. CONTRAST:  158m OMNIPAQUE IOHEXOL 350 MG/ML SOLN COMPARISON:  CT abdomen pelvis Oct 26, 2011, August 23, 2017, chest radiograph 07/02/2019, CTA chest 02/07/2014 FINDINGS: CTA CHEST FINDINGS Cardiovascular: Satisfactory opacification the pulmonary arteries to the segmental level. No pulmonary artery filling defects are identified. Central pulmonary arterial enlargement is similar to comparison exams. There is stable cardiomegaly with predominantly left cardiac enlargement including left atrial  enlargement. There is a partially calcified filling defect in the left atrium favoring a thrombus given interval decrease in size since September 2015 comparison CT angiography. There has been slight interval decrease from the more recent comparison CT abdomen pelvis 08/23/2017 now measuring 22 x 16 mm, previously 22 x 35 mm at a similar level. A bioprosthetic mitral valve replacement is noted. Numerous cardiac pacer/defibrillator leads are present positioned in the right atrium, right ventricle, and left ventricle. Battery pack overlies the left chest wall. Evidence of prior CABG with extensive coronary artery calcifications of the native vessels. Pericardial calcification may be postsurgical in nature or related to prior effusion. Additional atherosclerotic plaque within the normal caliber aorta. Normal 3 vessel branching of the aortic arch. Proximal great vessels are unremarkable. Mediastinum/Nodes: Multiple enlarged mediastinal and hilar nodes are present these include a 10 mm right hilar node (3/68), a 16 mm subcarinal node (3/64), and a 16 mm right paratracheal lymph node (3/39). No acute abnormality of the trachea or esophagus. Thoracic  inlet is largely obscured by streak artifact from patient's contrast bolus and pacer pack no gross abnormalities are seen. Lungs/Pleura: Diffuse interlobular septal and fissural thickening is noted as well as extensive peribronchovascular thickening. Multifocal areas of ground-glass opacity and mixed airspace disease are present throughout both lungs. More consolidative opacities have a somewhat peripheral lysed appearance. There are few calcified pleural plaque seen posteriorly in both lungs. No convincing pleural effusion. No visible pneumothorax. Musculoskeletal: Multilevel degenerative changes are present in the imaged portions of the spine. Post sternotomy changes with intact sternal sutures. Left chest wall battery pack. Mild bilateral gynecomastia. Review of the MIP  images confirms the above findings. CT ABDOMEN and PELVIS FINDINGS Hepatobiliary: No focal liver lesions. Smooth hepatic surface contour. Calcified gallstone is present within the normally distended gallbladder. No pericholecystic fluid or inflammation is seen. Some irregular thickening at the tip of the gallbladder may reflect adenomyomatosis and appears similar to comparison studies. No biliary ductal dilatation. Pancreas: Unremarkable. No pancreatic ductal dilatation or surrounding inflammatory changes. Spleen: Normal in size without focal abnormality. Adrenals/Urinary Tract: Normal adrenal glands. Few subcentimeter hypoattenuating foci are present in both kidneys with asymmetric left renal atrophy and bilateral cortical scarring. No worrisome renal lesions. No urolithiasis or hydronephrosis is seen. Urinary bladder is unremarkable. Stomach/Bowel: Small hiatal hernia. Distal stomach and duodenum are unremarkable. There is a slightly patulous, air-filled loop of small bowel in the mid abdomen which appears to demonstrate and anastomotic line and may reflect prior small bowel to small bowel anastomosis. There is herniation of the anti mesenteric wall of this segment through an infraumbilical ventral hernia but without evidence of mechanical obstruction or vascular compromise. This is slightly increased in size from comparison exam. A normal appendix is visualized. No colonic dilatation or wall thickening. Scattered colonic diverticula without focal pericolonic inflammation to suggest diverticulitis. Vascular/Lymphatic: Atherosclerotic plaque within the normal caliber aorta and branch vessels. No aneurysm or ectasia is seen. No worrisome abdominopelvic lymph nodes. Reproductive: The prostate and seminal vesicles are unremarkable. Other: No abdominopelvic free fluid or free gas. No bowel containing hernias. Richter's hernia containing portion of the anti mesenteric small bowel, as detailed above. No other bowel  containing hernias. Bilateral fat containing inguinal hernias. Musculoskeletal: Multilevel degenerative changes are present in the imaged portions of the spine. Remote compression deformity of L4 is similar to prior. No acute or suspicious osseous lesion. Review of the MIP images confirms the above findings. IMPRESSION: CTA CHEST 1. No evidence of acute pulmonary artery filling defect to suggest pulmonary embolus. 2. There is a partially calcified filling defect in the left atrium favoring a thrombus given interval decrease in size since September 2015 comparison CT abdomen pelvis. 3. Multifocal areas of ground-glass opacity and mixed airspace disease throughout both lungs. The more consolidative opacities have a somewhat peripheral appearance. Favor some residual infectious consolidation or residual opacity from patient's known COVID-19 diagnosis with superimposed edema/heart failure. 4. Mediastinal and hilar lymphadenopathy is likely reactive. 5. Cardiomegaly with predominantly left atrial ventricular enlargement and prior bioprosthetic mitral valve replacement. 6. Evidence of prior CABG with extensive coronary artery calcifications of the native vessels. CT ABDOMEN AND PELVIS 1. No acute findings in the abdomen or pelvis to explain the patient's symptoms. 2. Colonic diverticulosis without evidence of diverticulitis. 3. Richter's hernia containing portion of the anti mesenteric small bowel. No evidence of mechanical obstruction or vascular compromise. The herniated portion of bowel appears to correspond to a chronically patulous segment of small bowel with surgical material likely  reflecting prior anastomosis. 4. Bilateral fat containing inguinal hernias. 5. Cholelithiasis without evidence of acute cholecystitis. Thickening at the tip of the gallbladder may reflect adenomyomatosis, could consider further evaluation with nonemergent, outpatient right upper quadrant ultrasound These results were called by telephone  at the time of interpretation on 07/03/2019 at 3:36 am to provider [facility] PA, who verbally acknowledged these results. Electronically Signed   By: Lovena Le M.D.   On: 07/03/2019 03:37   CT ABDOMEN PELVIS W CONTRAST  Result Date: 07/03/2019 CLINICAL DATA:  COVID-19 positive, cough, sudden episode of emesis while eating EXAM: CT ANGIOGRAPHY CHEST CT ABDOMEN AND PELVIS WITH CONTRAST TECHNIQUE: Multidetector CT imaging of the chest was performed using the standard protocol during bolus administration of intravenous contrast. Multiplanar CT image reconstructions and MIPs were obtained to evaluate the vascular anatomy. Multidetector CT imaging of the abdomen and pelvis was performed using the standard protocol during bolus administration of intravenous contrast. CONTRAST:  174m OMNIPAQUE IOHEXOL 350 MG/ML SOLN COMPARISON:  CT abdomen pelvis Oct 26, 2011, August 23, 2017, chest radiograph 07/02/2019, CTA chest 02/07/2014 FINDINGS: CTA CHEST FINDINGS Cardiovascular: Satisfactory opacification the pulmonary arteries to the segmental level. No pulmonary artery filling defects are identified. Central pulmonary arterial enlargement is similar to comparison exams. There is stable cardiomegaly with predominantly left cardiac enlargement including left atrial enlargement. There is a partially calcified filling defect in the left atrium favoring a thrombus given interval decrease in size since September 2015 comparison CT angiography. There has been slight interval decrease from the more recent comparison CT abdomen pelvis 08/23/2017 now measuring 22 x 16 mm, previously 22 x 35 mm at a similar level. A bioprosthetic mitral valve replacement is noted. Numerous cardiac pacer/defibrillator leads are present positioned in the right atrium, right ventricle, and left ventricle. Battery pack overlies the left chest wall. Evidence of prior CABG with extensive coronary artery calcifications of the native vessels. Pericardial  calcification may be postsurgical in nature or related to prior effusion. Additional atherosclerotic plaque within the normal caliber aorta. Normal 3 vessel branching of the aortic arch. Proximal great vessels are unremarkable. Mediastinum/Nodes: Multiple enlarged mediastinal and hilar nodes are present these include a 10 mm right hilar node (3/68), a 16 mm subcarinal node (3/64), and a 16 mm right paratracheal lymph node (3/39). No acute abnormality of the trachea or esophagus. Thoracic inlet is largely obscured by streak artifact from patient's contrast bolus and pacer pack no gross abnormalities are seen. Lungs/Pleura: Diffuse interlobular septal and fissural thickening is noted as well as extensive peribronchovascular thickening. Multifocal areas of ground-glass opacity and mixed airspace disease are present throughout both lungs. More consolidative opacities have a somewhat peripheral lysed appearance. There are few calcified pleural plaque seen posteriorly in both lungs. No convincing pleural effusion. No visible pneumothorax. Musculoskeletal: Multilevel degenerative changes are present in the imaged portions of the spine. Post sternotomy changes with intact sternal sutures. Left chest wall battery pack. Mild bilateral gynecomastia. Review of the MIP images confirms the above findings. CT ABDOMEN and PELVIS FINDINGS Hepatobiliary: No focal liver lesions. Smooth hepatic surface contour. Calcified gallstone is present within the normally distended gallbladder. No pericholecystic fluid or inflammation is seen. Some irregular thickening at the tip of the gallbladder may reflect adenomyomatosis and appears similar to comparison studies. No biliary ductal dilatation. Pancreas: Unremarkable. No pancreatic ductal dilatation or surrounding inflammatory changes. Spleen: Normal in size without focal abnormality. Adrenals/Urinary Tract: Normal adrenal glands. Few subcentimeter hypoattenuating foci are present in both  kidneys with asymmetric left renal atrophy and bilateral cortical scarring. No worrisome renal lesions. No urolithiasis or hydronephrosis is seen. Urinary bladder is unremarkable. Stomach/Bowel: Small hiatal hernia. Distal stomach and duodenum are unremarkable. There is a slightly patulous, air-filled loop of small bowel in the mid abdomen which appears to demonstrate and anastomotic line and may reflect prior small bowel to small bowel anastomosis. There is herniation of the anti mesenteric wall of this segment through an infraumbilical ventral hernia but without evidence of mechanical obstruction or vascular compromise. This is slightly increased in size from comparison exam. A normal appendix is visualized. No colonic dilatation or wall thickening. Scattered colonic diverticula without focal pericolonic inflammation to suggest diverticulitis. Vascular/Lymphatic: Atherosclerotic plaque within the normal caliber aorta and branch vessels. No aneurysm or ectasia is seen. No worrisome abdominopelvic lymph nodes. Reproductive: The prostate and seminal vesicles are unremarkable. Other: No abdominopelvic free fluid or free gas. No bowel containing hernias. Richter's hernia containing portion of the anti mesenteric small bowel, as detailed above. No other bowel containing hernias. Bilateral fat containing inguinal hernias. Musculoskeletal: Multilevel degenerative changes are present in the imaged portions of the spine. Remote compression deformity of L4 is similar to prior. No acute or suspicious osseous lesion. Review of the MIP images confirms the above findings. IMPRESSION: CTA CHEST 1. No evidence of acute pulmonary artery filling defect to suggest pulmonary embolus. 2. There is a partially calcified filling defect in the left atrium favoring a thrombus given interval decrease in size since September 2015 comparison CT abdomen pelvis. 3. Multifocal areas of ground-glass opacity and mixed airspace disease throughout  both lungs. The more consolidative opacities have a somewhat peripheral appearance. Favor some residual infectious consolidation or residual opacity from patient's known COVID-19 diagnosis with superimposed edema/heart failure. 4. Mediastinal and hilar lymphadenopathy is likely reactive. 5. Cardiomegaly with predominantly left atrial ventricular enlargement and prior bioprosthetic mitral valve replacement. 6. Evidence of prior CABG with extensive coronary artery calcifications of the native vessels. CT ABDOMEN AND PELVIS 1. No acute findings in the abdomen or pelvis to explain the patient's symptoms. 2. Colonic diverticulosis without evidence of diverticulitis. 3. Richter's hernia containing portion of the anti mesenteric small bowel. No evidence of mechanical obstruction or vascular compromise. The herniated portion of bowel appears to correspond to a chronically patulous segment of small bowel with surgical material likely reflecting prior anastomosis. 4. Bilateral fat containing inguinal hernias. 5. Cholelithiasis without evidence of acute cholecystitis. Thickening at the tip of the gallbladder may reflect adenomyomatosis, could consider further evaluation with nonemergent, outpatient right upper quadrant ultrasound These results were called by telephone at the time of interpretation on 07/03/2019 at 3:36 am to provider [facility] PA, who verbally acknowledged these results. Electronically Signed   By: Lovena Le M.D.   On: 07/03/2019 03:37   DG Chest Portable 1 View  Result Date: 07/02/2019 CLINICAL DATA:  Vomiting tonight. Rhonchi in the lower lobes. Cough. EXAM: PORTABLE CHEST 1 VIEW COMPARISON:  Chest x-rays dated 05/30/2019 and 05/26/2019 FINDINGS: Stable cardiomegaly. LEFT chest wall pacemaker apparatus appears stable. Again noted are the hazy/interstitial airspace opacities bilaterally, not significantly changed compared to the most recent exams, and chronic central pulmonary vascular congestion,  suggesting a chronic CHF. No new lung findings. No pneumothorax seen. Osseous structures about the chest are unremarkable. IMPRESSION: Probable chronic CHF. No acute findings. No evidence of superimposed pneumonia. Electronically Signed   By: Franki Cabot M.D.   On: 07/02/2019 20:47   ekg paced at  Assessment & Plan:    Principal Problem:   Acute respiratory failure with hypoxia (HCC) Active Problems:   Atrial fibrillation, chronic (HCC)   Hyperlipidemia   Hypertension   Chronic systolic CHF (congestive heart failure) (HCC)   Chronic respiratory failure with hypoxia (HCC)   Dementia (HCC)   Uncontrolled type 2 diabetes mellitus with hyperglycemia (HCC)   Aspiration pneumonia (HCC)   Severe protein-calorie malnutrition (HCC)  Acute respiratory failure on chronic respiratory failure with hypoxia and hypercapnea ? Aspiration pneumonia Check abg later today to monitor Co2 NPO Zosyn iv pharmacy to dose for aspiration.  Speech therapy to evaluate   Chronic Afib Cont Eliquis 61m po bid Cont Digoxin 0.1270mpo qday  Chronic CHF (EF 30%) Cont Digoxin as above Cont Torsemide 8054mo qday  CAD s/p stent Lcx Cont Lipitor 13m34m qhs  Elevated elevated troponin Check cardiac echo  Dm2 Hold Glucotrol Hold Insulin (levemir) Fsbs q4h, ISS  Copd  Cont Brovana 1 neb bid Cont Budesonide  1 neb bid  Hepatic encephalopathy Cont Lactulose  Richter hernia has been there in 2019 Consider consulting surgery or discussing w them options  DVT Prophylaxis-   Eliquis , SCD  AM Labs Ordered, also please review Full Orders  Family Communication: Admission, patients condition and plan of care including tests being ordered have been discussed with the patient  who indicate understanding and agree with the plan and Code Status.  Code Status:  FULL CODE per patient, [facility] Observationt: Based on patients clinical  presentation and evaluation of above clinical data, I have made determination that patient meets Observation criteria at this time.    Time spent in minutes : 70 minutes   JameJani Gravel on 07/03/2019 at 4:29 AM

## 2019-07-03 NOTE — Telephone Encounter (Signed)
FYI.Patient is currently admitted.  Patient Name: [name] Gender: Male DOB: [birth year] Age: 74 Y 6 D Return Phone Number: [phone] (Primary), [phone] (Secondary) [name] [address] Client [name] Day - Client Client Site [facility] - Day Physician [name] - MD Contact Type Call Who Is Calling Patient / Member / Family / Caregiver Call Type Triage / Clinical Caller Name [name] Relationship To Patient Daughter Return Phone Number [phone] (Primary) Chief Complaint BREATHING - shortness of breath or sounds breathless Reason for Call Symptomatic / Request for Health Information Initial Comment Caller says that she tried to bring her father to the office for lab work today but his oxygen is depleted; it is reading 16, on 3 litters of oxygen. 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 but EMS will not transfer to the hospital because he is not in cardiac arrest. And don't want him to be exposed. He is pale. She wants him seen at the hospital. NOTE: Caller did not say her father is SOB. Charge said to put it in urgent. Translation No Nurse Assessment Nurse: Hermelinda Dellen, RN, Brandi Date/Time (Eastern Time): 06/30/2019 5:08:40 PM Confirm and document reason for call. If symptomatic, describe symptoms. ---Caller reports her father is weaker than normal. Reports his o2sat is 96% on 3L [place]. Reports O2dropped to the 60s today when they were in the car, she pulled into the fire department where EMS came out to see him; Not in cardiac arrest and ems refused to transfer pt at that time. Has the patient had close contact with a person known or suspected to have the novel coronavirus illness OR traveled / lives in area with major community spread (including international travel) in the last 14 days from the onset of symptoms? * If Asymptomatic, screen for exposure and  travel within the last 14 days. ---Yes Does the patient have any new or worsening symptoms? ---Yes Will a triage be completed? ---Yes Related visit to physician within the last 2 weeks? ---Yes Does the PT have any chronic conditions? (i.e. diabetes, asthma, this includes High risk factors for pregnancy, etc.) ---Yes List chronic conditions. ---Diabetic pacemaker COPD CHF Is this a behavioral health or substance abuse call? ---No Phone: [phone], Toll-Free: [phone], Fax: [phone] Weakness (Generalized) and Fatigue Pale skin (pallor) Hermelinda Dellen, RN, Brandi 06/30/2019 5:18:17 PM Disp. Time Lamount Cohen Time) Disposition Final User 06/30/2019 5:05:45 PM Send to Urgent [facility] Gene 06/30/2019 5:25:58 PM See HCP within 4 Hours (or PCP triage) Yes Hermelinda Dellen, RN, Merry Proud Caller Disagree/Comply Comply Caller Understands Yes PreDisposition Home Care Care Advice Given Per Guideline SEE HCP WITHIN 4 HOURS (OR PCP TRIAGE): * IF OFFICE WILL BE CLOSED AND NO PCP (PRIMARY CARE PROVIDER) SECOND-LEVEL TRIAGE: You need to be seen within the next 3 or 4 hours. A nearby [name]) is often a good source of care. Another choice is to go to the ED. Go sooner if you become worse. CALL BACK IF: * You become worse. Comments User: Marinda Elk, RN Date/Time Lamount Cohen Time): 06/30/2019 5:11:59 PM Reports he was in rehab until the end of DEC. recovering from COVID. 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 Usually wears 2L/[place]. Referrals Warm transfer to backline GO TO FACILITY UNDECIDED

## 2019-07-03 NOTE — ED Notes (Signed)
Echo done.

## 2019-07-03 NOTE — Progress Notes (Signed)
ANTICOAGULATION CONSULT NOTE - Initial Consult  Pharmacy Consult for Eliquis Indication: atrial fibrillation  Allergies  Allergen Reactions  . Warfarin Other (See Comments)    Non compliance and ETOH abuse    Vital Signs: Temp: 99.2 F (37.3 C) (01/24 2008) Temp Source: Oral (01/24 2008) BP: 102/71 (01/25 0543) Pulse Rate: 61 (01/25 0543)  Labs: Recent Labs    07/02/19 2100 07/02/19 2100 07/02/19 2110 07/03/19 0012 07/03/19 0451  HGB 9.6*   < > 10.9*  --  10.8*  HCT 31.5*  --  32.0*  --  36.0*  PLT 242  --   --   --  269  LABPROT 17.9*  --   --   --   --   INR 1.5*  --   --   --   --   CREATININE 1.28*  --   --   --  1.42*  TROPONINIHS 40*  --   --  39*  --    < > = values in this interval not displayed.    Estimated Creatinine Clearance: 37.7 mL/min (A) (by C-G formula based on SCr of 1.42 mg/dL (H)).   Medical History: Past Medical History:  Diagnosis Date  . AICD (automatic cardioverter/defibrillator) present   . Alcohol abuse   . Anxiety   . Bipolar disorder (HCC)   . Biventricular automatic implantable cardioverter defibrillator in situ    a. 01/2014 s/p MDT [id number] Viva XT CRT-D (ser # [id number]).  . CAD (coronary artery disease)    a. s/p prior PCI/stenting of the LCX;  b. 08/2014 MV: EF 20%, large septal, apical, and inferior infarct from apex to base, no ischemia;  c. 08/2014 NSTEMI/Cath: LM mod distal dzs extending into ostial LCX (80%), LAD tortuous, RI nl, OM mod dzs, RCA 100 CTO with R->R and L->R collats-->Med Rx.  . CHF (congestive heart failure) (HCC)   . Chronic atrial fibrillation (HCC)    a. CHA2DS2VASc = 6->eliquis;  b. S/P AVN RFCA an BiV ICD placement.  . Chronic renal insufficiency, stage 3 (moderate) 2020   GFR low 40s (estimated baseline sCr 1.5)  . Chronic respiratory failure (HCC)    hypoxic and hypercarbic  . Chronic systolic congestive heart failure (HCC)    a. 01/2014 Echo: EF 25-30%, mod conc LVH, mod dil LA.  Marland Kitchen Complete heart block  (HCC)    a. In setting of prior AV nodal ablation r/t afib-->BiV ICD (01/2014).  . COPD (chronic obstructive pulmonary disease) (HCC)   . COVID-19 virus infection 05/26/2019   hosp + rehab  . CVA (cerebral vascular accident) (HCC)    a. Multiple prior embolic strokes.  . Dementia (HCC)    suspect vascular  . Depression   . Diabetes mellitus (HCC)   . DVT (deep venous thrombosis) (HCC)   . Hepatic steatosis    w/out imaging evidence of cirrhosis on u/s or CT scan (as of 01/2019).  +hx of elevated ammonia.  . Hypertension   . Mitral valve disease    a. remote mitral replacement with Bjork Shiley valve;  b. 06/2006 Redo MVR with tissue valve.  . Mixed Ischemic and Nonischemic Cardiomyopathy    a. 8/.2015 Echo: Ef 25-30%;  b. 01/2014 s/p MDT [id number] Talbot Grumbling XT CRT-D (ser # [id number]).  . On home oxygen therapy    "2L; 24/7" (65/16/2019)  . Psoriasis    "back of his head; elbows; finger of left hand" (06/07/2017)  . Pure hypercholesterolemia   . S/P AV nodal  ablation   . Seborrheic dermatitis   . Tobacco dependence    current as of 07/2018  . Ventral hernia     Assessment: 74yo male to continue Eliquis for Afib during admission.   Plan:  Will continue Eliquis 5mg  PO BID; consider dose reduction if SCr increases >1.5 (pt wt <60kg).  Wynona Neat, PharmD, BCPS  07/03/2019,6:06 AM

## 2019-07-03 NOTE — Progress Notes (Signed)
  Echocardiogram 2D Echocardiogram has been performed.  [name] 07/03/2019, 9:36 AM

## 2019-07-03 NOTE — ED Notes (Signed)
Admitting provider bedside 

## 2019-07-03 NOTE — Progress Notes (Signed)
Per HPI: Cliff Damiani  is a 74 y.o. male, w Dementia, Psoriasis,  hypertension, hyperlipidemia, Dm2, CKD stage3, CAD, s/p DES LCx, Chronic Afib, Chronic systolic CHF(EF 30%), s/p AICD,  bioprosthetic MVR, Copd (gold 3) on home o2, 2-4L [place], OSA prev on CPAP, Covid-19 admission 05/28/19, presents due to choking on some food per ED notes but daughter states was choking up on his mucous (milky white-brown), 1 episode of n/v, . White.  Pt 88% on 4L home o2 and increase to 98% when placed on NRB at 15L pt sent to ED for evaluation.   Pt admitted with acute hypoxemic respiratory failure secondary to aspiration pneumonia and has been started on Zosyn. Seen at bedside on 4L [place] with no acute complaints or concerns. Covid negative. Continue medications as ordered. Echocardiogram ordered with results pending. Follow am labs. Diet advancement after SLP evaluation.   Total care time: 20 minutes.

## 2019-07-03 NOTE — Progress Notes (Signed)
Pharmacy Antibiotic Note  [name] is a 74 y.o. male admitted on 07/02/2019 with concern for aspiration pneumonia.  Pharmacy has been consulted for Zosyn dosing.  Plan: Zosyn 3.375g IV q8h (4-hour infusion).   Temp (24hrs), Avg:99.2 F (37.3 C), Min:99.2 F (37.3 C), Max:99.2 F (37.3 C)  Recent Labs  Lab 07/02/19 2100 07/02/19 2101 07/03/19 0012 07/03/19 0451  WBC 13.1*  --   --  17.4*  CREATININE 1.28*  --   --  1.42*  LATICACIDVEN  --  2.3* 1.0  --     Estimated Creatinine Clearance: 37.7 mL/min (A) (by C-G formula based on SCr of 1.42 mg/dL (H)).    Allergies  Allergen Reactions  . Warfarin Other (See Comments)    Non compliance and ETOH abuse     Thank you for allowing pharmacy to be a part of this patient's care.  Vernard Gambles, PharmD, BCPS  07/03/2019 5:45 AM

## 2019-07-03 NOTE — ED Notes (Signed)
Called CT, pt is next to be transported.

## 2019-07-04 ENCOUNTER — Encounter: Payer: Medicare Other | Admitting: Family Medicine

## 2019-07-04 LAB — CBC
HCT: 31.4 % — ABNORMAL LOW (ref 39.0–52.0)
Hemoglobin: 9.6 g/dL — ABNORMAL LOW (ref 13.0–17.0)
MCH: 28.5 pg (ref 26.0–34.0)
MCHC: 30.6 g/dL (ref 30.0–36.0)
MCV: 93.2 fL (ref 80.0–100.0)
Platelets: 267 10*3/uL (ref 150–400)
RBC: 3.37 MIL/uL — ABNORMAL LOW (ref 4.22–5.81)
RDW: 14.6 % (ref 11.5–15.5)
WBC: 20.1 10*3/uL — ABNORMAL HIGH (ref 4.0–10.5)
nRBC: 0 % (ref 0.0–0.2)

## 2019-07-04 LAB — BASIC METABOLIC PANEL
Anion gap: 9 (ref 5–15)
BUN: 40 mg/dL — ABNORMAL HIGH (ref 8–23)
CO2: 35 mmol/L — ABNORMAL HIGH (ref 22–32)
Calcium: 9.2 mg/dL (ref 8.9–10.3)
Chloride: 95 mmol/L — ABNORMAL LOW (ref 98–111)
Creatinine, Ser: 1.7 mg/dL — ABNORMAL HIGH (ref 0.61–1.24)
GFR calc Af Amer: 46 mL/min — ABNORMAL LOW (ref >60)
GFR calc non Af Amer: 39 mL/min — ABNORMAL LOW (ref >60)
Glucose, Bld: 195 mg/dL — ABNORMAL HIGH (ref 70–99)
Potassium: 5.2 mmol/L — ABNORMAL HIGH (ref 3.5–5.1)
Sodium: 139 mmol/L (ref 135–145)

## 2019-07-04 LAB — GLUCOSE, CAPILLARY
Glucose-Capillary: 180 mg/dL — ABNORMAL HIGH (ref 70–99)
Glucose-Capillary: 199 mg/dL — ABNORMAL HIGH (ref 70–99)
Glucose-Capillary: 213 mg/dL — ABNORMAL HIGH (ref 70–99)
Glucose-Capillary: 221 mg/dL — ABNORMAL HIGH (ref 70–99)
Glucose-Capillary: 260 mg/dL — ABNORMAL HIGH (ref 70–99)
Glucose-Capillary: 313 mg/dL — ABNORMAL HIGH (ref 70–99)

## 2019-07-04 LAB — BLOOD GAS, ARTERIAL
Acid-Base Excess: 10 mmol/L — ABNORMAL HIGH (ref 0.0–2.0)
Bicarbonate: 35.9 mmol/L — ABNORMAL HIGH (ref 20.0–28.0)
FIO2: 36
O2 Saturation: 98.2 %
Patient temperature: 36.4
pCO2 arterial: 66.8 mmHg (ref 32.0–48.0)
pH, Arterial: 7.346 — ABNORMAL LOW (ref 7.350–7.450)
pO2, Arterial: 101 mmHg (ref 83.0–108.0)

## 2019-07-04 LAB — MRSA PCR SCREENING: MRSA by PCR: NEGATIVE

## 2019-07-04 MED ORDER — MORPHINE SULFATE (CONCENTRATE) 10 MG/0.5ML PO SOLN: 5.0000 mg | ORAL | Status: DC | PRN

## 2019-07-04 MED ORDER — SODIUM CHLORIDE 0.9 % IV SOLN: INTRAVENOUS | Status: AC

## 2019-07-04 MED ORDER — LORAZEPAM 2 MG/ML PO CONC: 1.0000 mg | ORAL | Status: DC | PRN

## 2019-07-04 MED ORDER — LORAZEPAM 1 MG PO TABS: 1.0000 mg | ORAL_TABLET | ORAL | Status: DC | PRN

## 2019-07-04 MED ORDER — SODIUM ZIRCONIUM CYCLOSILICATE 10 G PO PACK
10.0000 g | PACK | Freq: Once | ORAL | Status: AC
  Administered 2019-07-04: 10 g via ORAL
  Filled 2019-07-04: qty 1

## 2019-07-04 MED ORDER — LORAZEPAM 2 MG/ML IJ SOLN: 1.0000 mg | INTRAMUSCULAR | Status: DC | PRN

## 2019-07-04 NOTE — Progress Notes (Signed)
Pt is currently in hospital

## 2019-07-04 NOTE — Progress Notes (Addendum)
PROGRESS NOTE    [name]  GGE:[id number] DOB: 10-Apr-1946 DOA: 07/02/2019 PCP: Jeoffrey Massed, MD   Brief Narrative:  Per HPI: HarryOvermanis a74 y.o.male,w Dementia, Psoriasis, hypertension, hyperlipidemia, Dm2, CKD stage3, CAD, s/p DES LCx, Chronic Afib, Chronic systolic CHF(EF 30%), s/p AICD, bioprosthetic MVR, Copd (gold 3) on home o2, 2-4L [place], OSA prev on CPAP,Covid-19 admission 05/28/19, presents due to choking on some foodper ED notes but daughter states was choking up on his mucous (milky white-brown), 1 episode of n/v, . White.Pt 88% on 4L home o2 and increase to 98% when placed on NRB at 15L pt sent to ED for evaluation.   1/25:Pt admitted with acute hypoxemic respiratory failure secondary to aspiration pneumonia and has been started on Zosyn. Seen at bedside on 4L [place] with no acute complaints or concerns. Covid negative. Continue medications as ordered. Echocardiogram ordered with results pending.  1/26: ABG with persistent hypercapnia noted, but appears to be at his usual baseline. Will give Lokelma for his hyperkalemia. Start IVF for AKI and monitor urine electrolytes. Repeat labs. Continue treatment with Zosyn for now. MRSA pcr pending. Unfortunately, it does not appear that blood cultures were collected. Dysphagia 3 diet started.  Assessment & Plan:   Principal Problem:   Acute respiratory failure with hypoxia (HCC) Active Problems:   Atrial fibrillation, chronic (HCC)   Hyperlipidemia   Hypertension   Chronic systolic CHF (congestive heart failure) (HCC)   Chronic respiratory failure with hypoxia (HCC)   Dementia (HCC)   Uncontrolled type 2 diabetes mellitus with hyperglycemia (HCC)   Aspiration pneumonia (HCC)   Severe protein-calorie malnutrition (HCC)   Acute hypoxemic respiratory failure (HCC)   Acute on chronic hypoxemic respiratory failure along with chronic hypercapnia likely secondary to aspiration pneumonia ABG repeat has remained  stable Continue on Zosyn for now and monitor leukocytosis with repeat CBC in am No blood cultures unfortunately obtained in ED Wean to baseline 2L [place] as tolerated Dysphagia 3 diet per SLP  Chronic Afib-rate controlled Cont Eliquis 5mg  po bid Cont Digoxin 0.125mg  po qday  Chronic CHF (EF 30%)-continue monitoring on tele Cont Digoxin as above Holding home torsemide for now due to mild AKI  CKD IIIa Creatinine beginning to elevate with possible AKI Will check urine electrolytes Gentle, time-limited IVF; monitor I/Os Hold nephrotoxic agents  CAD s/p stent Lcx Cont Lipitor 40mg  po qhs  Elevated troponin-flat trend Likely demand ischemia LVEF 30-35% noted with some akinesis to inferior/lateral walls similar to 10/2017 No chest pain  Dm2-with mild hyperglycemia Hold Glucotrol Hold Insulin (levemir) Fsbs q4h, ISS  Copd  Cont Brovana 1 neb bid Cont Budesonide  1 neb bid  Hepatic encephalopathy Cont Lactulose Check ammonia levels  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 Wean O2 as tolerated to 2L. Discussed need for palliative evaluation with daughter. Will consult them for further insight as he appears hospice appropriate.   Consultants:   Palliative Care  Procedures:   None  Antimicrobials:  Anti-infectives (From admission, onward)   Start     Dose/Rate Route Frequency Ordered Stop   07/03/19 0600  piperacillin-tazobactam (ZOSYN) IVPB 3.375 g     3.375 g 12.5 mL/hr over 240 Minutes Intravenous Every 8 hours 07/03/19 0546         Subjective: Patient  seen and evaluated today with no new acute complaints or concerns. No acute concerns or events noted overnight. He is somewhat somnolent, but awakens easily and had breakfast.  Remains on 4L [place] oxygen today.  Objective: Vitals:   07/04/19 0443 07/04/19 0745 07/04/19 0852 07/04/19 0923  BP: (!) 119/58     Pulse: (!) 59 (!) 59 60 61  Resp: Temp: (!) 97.5 F (36.4 C)     TempSrc: Oral     SpO2: 96% 96% 97%   Weight: 67.5 kg     Height:        Intake/Output Summary (Last 24 hours) at 07/04/2019 0938 Last data filed at 07/04/2019 0923 Gross per 24 hour  Intake 315.04 ml  Output 1400 ml  Net -1084.96 ml   Filed Weights   07/04/19 0443  Weight: 67.5 kg    Examination:  General exam: Appears calm and comfortable  Respiratory system: Clear to auscultation. Respiratory effort normal. Currently on 4L [place] oxygen. Cardiovascular system: S1 & S2 heard, RRR. No JVD, murmurs, rubs, gallops or clicks. No pedal edema. Gastrointestinal system: Abdomen is nondistended, soft and nontender. No organomegaly or masses felt. Normal bowel sounds heard. Central nervous system: Somnolent, but arouses easily. Extremities: Symmetric 5 x 5 power. Skin: No rashes, lesions or ulcers Psychiatry: Difficult to assess.    Data Reviewed: I have personally reviewed following labs and imaging studies  CBC: Recent Labs  Lab 07/02/19 2100 07/02/19 2110 07/03/19 0451 07/04/19 0413  WBC 13.1*  --  17.4* 20.1*  NEUTROABS 9.8*  --   --   --   HGB 9.6* 10.9* 10.8* 9.6*  HCT 31.5* 32.0* 36.0* 31.4*  MCV 95.2  --  95.0 93.2  PLT 242  --  269 267   Basic Metabolic Panel: Recent Labs  Lab 07/02/19 2100 07/02/19 2110 07/03/19 0451 07/03/19 1617 07/04/19 0413  NA 141 139 138 138 139  K 4.2 4.7 6.1* 4.8 5.2*  CL 100  --  94* 93* 95*  CO2 32  --  34* 31 35*  GLUCOSE 178*  --  257* 294* 195*  BUN 31*  --  32* 33* 40*  CREATININE 1.28*  --  1.42* 1.56* 1.70*  CALCIUM 8.3*  --  9.1 9.3 9.2   GFR: Estimated Creatinine Clearance: 32.9 mL/min (A) (by C-G formula based on SCr of 1.7 mg/dL (H)). Liver Function Tests: Recent Labs  Lab 07/02/19 2100 07/03/19 0451    AST 33 33  ALT 29 34  ALKPHOS 84 100  BILITOT 0.5 0.3  PROT 6.2* 7.4  ALBUMIN 2.8* 3.3*   No results for input(s): LIPASE, AMYLASE in the last 168 hours. No results for input(s): AMMONIA in the last 168 hours. Coagulation Profile: Recent Labs  Lab 07/02/19 2100  INR 1.5*   Cardiac Enzymes: No results for input(s): CKTOTAL, CKMB, CKMBINDEX, TROPONINI in the last 168 hours. BNP (last 3 results) No results for input(s): PROBNP in the last 8760 hours. HbA1C: No results for input(s): HGBA1C in the last 72 hours. CBG: Recent Labs  Lab 07/03/19 1606 07/03/19 2117 07/04/19 0038 07/04/19 0448 07/04/19 0759  GLUCAP 260* 316* 221* 180* 213*   Lipid Profile: No results for input(s): CHOL, HDL, LDLCALC, TRIG, CHOLHDL, LDLDIRECT in the last 72 hours. Thyroid Function Tests: No results for input(s): TSH, T4TOTAL, FREET4, T3FREE, THYROIDAB in the last 72 hours. Anemia Panel: No results for input(s): VITAMINB12, FOLATE, FERRITIN, TIBC, IRON, RETICCTPCT in the last 72  hours. Sepsis Labs: Recent Labs  Lab 07/02/19 2101 07/03/19 0012  LATICACIDVEN 2.3* 1.0    No results found for this or any previous visit (from the past 240 hour(s)).       Radiology Studies: CT Angio Chest PE W and/or Wo Contrast  Result Date: 07/03/2019 CLINICAL DATA:  COVID-19 positive, cough, sudden episode of emesis while eating EXAM: CT ANGIOGRAPHY CHEST CT ABDOMEN AND PELVIS WITH CONTRAST TECHNIQUE: Multidetector CT imaging of the chest was performed using the standard protocol during bolus administration of intravenous contrast. Multiplanar CT image reconstructions and MIPs were obtained to evaluate the vascular anatomy. Multidetector CT imaging of the abdomen and pelvis was performed using the standard protocol during bolus administration of intravenous contrast. CONTRAST:  OMNIPAQUE IOHEXOL 350 MG/ML SOLN COMPARISON:  CT abdomen pelvis Oct 26, 2011, August 23, 2017, chest radiograph 07/02/2019, CTA  chest 02/07/2014 FINDINGS: CTA CHEST FINDINGS Cardiovascular: Satisfactory opacification the pulmonary arteries to the segmental level. No pulmonary artery filling defects are identified. Central pulmonary arterial enlargement is similar to comparison exams. There is stable cardiomegaly with predominantly left cardiac enlargement including left atrial enlargement. There is a partially calcified filling defect in the left atrium favoring a thrombus given interval decrease in size since September 2015 comparison CT angiography. There has been slight interval decrease from the more recent comparison CT abdomen pelvis 08/23/2017 now measuring 22 x 16 mm, previously 22 x 35 mm at a similar level. A bioprosthetic mitral valve replacement is noted. Numerous cardiac pacer/defibrillator leads are present positioned in the right atrium, right ventricle, and left ventricle. Battery pack overlies the left chest wall. Evidence of prior CABG with extensive coronary artery calcifications of the native vessels. Pericardial calcification may be postsurgical in nature or related to prior effusion. Additional atherosclerotic plaque within the normal caliber aorta. Normal 3 vessel branching of the aortic arch. Proximal great vessels are unremarkable. Mediastinum/Nodes: Multiple enlarged mediastinal and hilar nodes are present these include a 10 mm right hilar node (3/68), a 16 mm subcarinal node (3/64), and a 16 mm right paratracheal lymph node (3/39). No acute abnormality of the trachea or esophagus. Thoracic inlet is largely obscured by streak artifact from patient's contrast bolus and pacer pack no gross abnormalities are seen. Lungs/Pleura: Diffuse interlobular septal and fissural thickening is noted as well as extensive peribronchovascular thickening. Multifocal areas of ground-glass opacity and mixed airspace disease are present throughout both lungs. More consolidative opacities have a somewhat peripheral lysed appearance. There  are few calcified pleural plaque seen posteriorly in both lungs. No convincing pleural effusion. No visible pneumothorax. Musculoskeletal: Multilevel degenerative changes are present in the imaged portions of the spine. Post sternotomy changes with intact sternal sutures. Left chest wall battery pack. Mild bilateral gynecomastia. Review of the MIP images confirms the above findings. CT ABDOMEN and PELVIS FINDINGS Hepatobiliary: No focal liver lesions. Smooth hepatic surface contour. Calcified gallstone is present within the normally distended gallbladder. No pericholecystic fluid or inflammation is seen. Some irregular thickening at the tip of the gallbladder may reflect adenomyomatosis and appears similar to comparison studies. No biliary ductal dilatation. Pancreas: Unremarkable. No pancreatic ductal dilatation or surrounding inflammatory changes. Spleen: Normal in size without focal abnormality. Adrenals/Urinary Tract: Normal adrenal glands. Few subcentimeter hypoattenuating foci are present in both kidneys with asymmetric left renal atrophy and bilateral cortical scarring. No worrisome renal lesions. No urolithiasis or hydronephrosis is seen. Urinary bladder is unremarkable. Stomach/Bowel: Small hiatal hernia. Distal stomach and duodenum are unremarkable. There is a  slightly patulous, air-filled loop of small bowel in the mid abdomen which appears to demonstrate and anastomotic line and may reflect prior small bowel to small bowel anastomosis. There is herniation of the anti mesenteric wall of this segment through an infraumbilical ventral hernia but without evidence of mechanical obstruction or vascular compromise. This is slightly increased in size from comparison exam. A normal appendix is visualized. No colonic dilatation or wall thickening. Scattered colonic diverticula without focal pericolonic inflammation to suggest diverticulitis. Vascular/Lymphatic: Atherosclerotic plaque within the normal caliber  aorta and branch vessels. No aneurysm or ectasia is seen. No worrisome abdominopelvic lymph nodes. Reproductive: The prostate and seminal vesicles are unremarkable. Other: No abdominopelvic free fluid or free gas. No bowel containing hernias. Richter's hernia containing portion of the anti mesenteric small bowel, as detailed above. No other bowel containing hernias. Bilateral fat containing inguinal hernias. Musculoskeletal: Multilevel degenerative changes are present in the imaged portions of the spine. Remote compression deformity of L4 is similar to prior. No acute or suspicious osseous lesion. Review of the MIP images confirms the above findings. IMPRESSION: CTA CHEST 1. No evidence of acute pulmonary artery filling defect to suggest pulmonary embolus. 2. There is a partially calcified filling defect in the left atrium favoring a thrombus given interval decrease in size since September 2015 comparison CT abdomen pelvis. 3. Multifocal areas of ground-glass opacity and mixed airspace disease throughout both lungs. The more consolidative opacities have a somewhat peripheral appearance. Favor some residual infectious consolidation or residual opacity from patient's known COVID-19 diagnosis with superimposed edema/heart failure. 4. Mediastinal and hilar lymphadenopathy is likely reactive. 5. Cardiomegaly with predominantly left atrial ventricular enlargement and prior bioprosthetic mitral valve replacement. 6. Evidence of prior CABG with extensive coronary artery calcifications of the native vessels. CT ABDOMEN AND PELVIS 1. No acute findings in the abdomen or pelvis to explain the patient's symptoms. 2. Colonic diverticulosis without evidence of diverticulitis. 3. Richter's hernia containing portion of the anti mesenteric small bowel. No evidence of mechanical obstruction or vascular compromise. The herniated portion of bowel appears to correspond to a chronically patulous segment of small bowel with surgical  material likely reflecting prior anastomosis. 4. Bilateral fat containing inguinal hernias. 5. Cholelithiasis without evidence of acute cholecystitis. Thickening at the tip of the gallbladder may reflect adenomyomatosis, could consider further evaluation with nonemergent, outpatient right upper quadrant ultrasound These results were called by telephone at the time of interpretation on 07/03/2019 at 3:36 am to provider [facility] PA, who verbally acknowledged these results. Electronically Signed   By: Kreg Shropshire M.D.   On: 07/03/2019 03:37   CT ABDOMEN PELVIS W CONTRAST  Result Date: 07/03/2019 CLINICAL DATA:  COVID-19 positive, cough, sudden episode of emesis while eating EXAM: CT ANGIOGRAPHY CHEST CT ABDOMEN AND PELVIS WITH CONTRAST TECHNIQUE: Multidetector CT imaging of the chest was performed using the standard protocol during bolus administration of intravenous contrast. Multiplanar CT image reconstructions and MIPs were obtained to evaluate the vascular anatomy. Multidetector CT imaging of the abdomen and pelvis was performed using the standard protocol during bolus administration of intravenous contrast. CONTRAST:  OMNIPAQUE IOHEXOL 350 MG/ML SOLN COMPARISON:  CT abdomen pelvis Oct 26, 2011, August 23, 2017, chest radiograph 07/02/2019, CTA chest 02/07/2014 FINDINGS: CTA CHEST FINDINGS Cardiovascular: Satisfactory opacification the pulmonary arteries to the segmental level. No pulmonary artery filling defects are identified. Central pulmonary arterial enlargement is similar to comparison exams. There is stable cardiomegaly with predominantly left cardiac enlargement including left atrial enlargement. There  is a partially calcified filling defect in the left atrium favoring a thrombus given interval decrease in size since September 2015 comparison CT angiography. There has been slight interval decrease from the more recent comparison CT abdomen pelvis 08/23/2017 now measuring 22 x 16 mm, previously  22 x 35 mm at a similar level. A bioprosthetic mitral valve replacement is noted. Numerous cardiac pacer/defibrillator leads are present positioned in the right atrium, right ventricle, and left ventricle. Battery pack overlies the left chest wall. Evidence of prior CABG with extensive coronary artery calcifications of the native vessels. Pericardial calcification may be postsurgical in nature or related to prior effusion. Additional atherosclerotic plaque within the normal caliber aorta. Normal 3 vessel branching of the aortic arch. Proximal great vessels are unremarkable. Mediastinum/Nodes: Multiple enlarged mediastinal and hilar nodes are present these include a 10 mm right hilar node (3/68), a 16 mm subcarinal node (3/64), and a 16 mm right paratracheal lymph node (3/39). No acute abnormality of the trachea or esophagus. Thoracic inlet is largely obscured by streak artifact from patient's contrast bolus and pacer pack no gross abnormalities are seen. Lungs/Pleura: Diffuse interlobular septal and fissural thickening is noted as well as extensive peribronchovascular thickening. Multifocal areas of ground-glass opacity and mixed airspace disease are present throughout both lungs. More consolidative opacities have a somewhat peripheral lysed appearance. There are few calcified pleural plaque seen posteriorly in both lungs. No convincing pleural effusion. No visible pneumothorax. Musculoskeletal: Multilevel degenerative changes are present in the imaged portions of the spine. Post sternotomy changes with intact sternal sutures. Left chest wall battery pack. Mild bilateral gynecomastia. Review of the MIP images confirms the above findings. CT ABDOMEN and PELVIS FINDINGS Hepatobiliary: No focal liver lesions. Smooth hepatic surface contour. Calcified gallstone is present within the normally distended gallbladder. No pericholecystic fluid or inflammation is seen. Some irregular thickening at the tip of the gallbladder  may reflect adenomyomatosis and appears similar to comparison studies. No biliary ductal dilatation. Pancreas: Unremarkable. No pancreatic ductal dilatation or surrounding inflammatory changes. Spleen: Normal in size without focal abnormality. Adrenals/Urinary Tract: Normal adrenal glands. Few subcentimeter hypoattenuating foci are present in both kidneys with asymmetric left renal atrophy and bilateral cortical scarring. No worrisome renal lesions. No urolithiasis or hydronephrosis is seen. Urinary bladder is unremarkable. Stomach/Bowel: Small hiatal hernia. Distal stomach and duodenum are unremarkable. There is a slightly patulous, air-filled loop of small bowel in the mid abdomen which appears to demonstrate and anastomotic line and may reflect prior small bowel to small bowel anastomosis. There is herniation of the anti mesenteric wall of this segment through an infraumbilical ventral hernia but without evidence of mechanical obstruction or vascular compromise. This is slightly increased in size from comparison exam. A normal appendix is visualized. No colonic dilatation or wall thickening. Scattered colonic diverticula without focal pericolonic inflammation to suggest diverticulitis. Vascular/Lymphatic: Atherosclerotic plaque within the normal caliber aorta and branch vessels. No aneurysm or ectasia is seen. No worrisome abdominopelvic lymph nodes. Reproductive: The prostate and seminal vesicles are unremarkable. Other: No abdominopelvic free fluid or free gas. No bowel containing hernias. Richter's hernia containing portion of the anti mesenteric small bowel, as detailed above. No other bowel containing hernias. Bilateral fat containing inguinal hernias. Musculoskeletal: Multilevel degenerative changes are present in the imaged portions of the spine. Remote compression deformity of L4 is similar to prior. No acute or suspicious osseous lesion. Review of the MIP images confirms the above findings. IMPRESSION:  CTA CHEST 1. No evidence of acute pulmonary  artery filling defect to suggest pulmonary embolus. 2. There is a partially calcified filling defect in the left atrium favoring a thrombus given interval decrease in size since September 2015 comparison CT abdomen pelvis. 3. Multifocal areas of ground-glass opacity and mixed airspace disease throughout both lungs. The more consolidative opacities have a somewhat peripheral appearance. Favor some residual infectious consolidation or residual opacity from patient's known COVID-19 diagnosis with superimposed edema/heart failure. 4. Mediastinal and hilar lymphadenopathy is likely reactive. 5. Cardiomegaly with predominantly left atrial ventricular enlargement and prior bioprosthetic mitral valve replacement. 6. Evidence of prior CABG with extensive coronary artery calcifications of the native vessels. CT ABDOMEN AND PELVIS 1. No acute findings in the abdomen or pelvis to explain the patient's symptoms. 2. Colonic diverticulosis without evidence of diverticulitis. 3. Richter's hernia containing portion of the anti mesenteric small bowel. No evidence of mechanical obstruction or vascular compromise. The herniated portion of bowel appears to correspond to a chronically patulous segment of small bowel with surgical material likely reflecting prior anastomosis. 4. Bilateral fat containing inguinal hernias. 5. Cholelithiasis without evidence of acute cholecystitis. Thickening at the tip of the gallbladder may reflect adenomyomatosis, could consider further evaluation with nonemergent, outpatient right upper quadrant ultrasound These results were called by telephone at the time of interpretation on 07/03/2019 at 3:36 am to provider [facility] PA, who verbally acknowledged these results. Electronically Signed   By: Kreg Shropshire M.D.   On: 07/03/2019 03:37   DG Chest Portable 1 View  Result Date: 07/02/2019 CLINICAL DATA:  Vomiting tonight. Rhonchi in the lower lobes. Cough.  EXAM: PORTABLE CHEST 1 VIEW COMPARISON:  Chest x-rays dated 05/30/2019 and 05/26/2019 FINDINGS: Stable cardiomegaly. LEFT chest wall pacemaker apparatus appears stable. Again noted are the hazy/interstitial airspace opacities bilaterally, not significantly changed compared to the most recent exams, and chronic central pulmonary vascular congestion, suggesting a chronic CHF. No new lung findings. No pneumothorax seen. Osseous structures about the chest are unremarkable. IMPRESSION: Probable chronic CHF. No acute findings. No evidence of superimposed pneumonia. Electronically Signed   By: Bary Richard M.D.   On: 07/02/2019 20:47   ECHOCARDIOGRAM LIMITED  Result Date: 07/03/2019   ECHOCARDIOGRAM LIMITED REPORT   Patient Name:   [facility] Date of Exam: 07/03/2019 Medical Rec #:  [id number]       Height:       64.0 in Accession #:    [id number]      Weight:       125.0 lb Date of Birth:  01/27/1946       BSA:          1.60 m Patient Age:    74 years        BP:           113/52 mmHg Patient Gender: M               HR:           63 bpm. Exam Location:  Inpatient  Procedure: Cardiac Doppler, Limited Echo and Limited Color Doppler Indications:    Elevated Troponin.  History:        Patient has prior history of Echocardiogram examinations, most                 recent 10/23/2017. Mitral Valve: bioprosthetic valve is present                 in the mitral position. COVID19 Positive. Mitral Valve  Replacement.  Sonographer:    Elmarie Shiley Dance Referring Phys: [id number] JAMES KIM IMPRESSIONS  1. Left ventricular ejection fraction, by visual estimation, is 30 to 35%.  2. Left ventricular diastolic parameters are indeterminate.  3. Mildly dilated left ventricular internal cavity size.  4. The left ventricle demonstrates regional wall motion abnormalities.  5. Akinesis of the mi to apical inferior, apex and apical septal myocardium.  6. Left atrial size was severely dilated.  7. The mitral valve has been  repaired/replaced. Mild mitral valve regurgitation. Moderate mitral stenosis.  8. Pulmonic valve regurgitation is mild by color flow Doppler.  9. A pacer wire is visualized. 10. The inferior vena cava is dilated in size with >50% respiratory variability, suggesting right atrial pressure of 8 mmHg. FINDINGS  Left Ventricle: Left ventricular ejection fraction, by visual estimation, is 30 to 35%. The left ventricle demonstrates regional wall motion abnormalities. The left ventricular internal cavity size was mildly dilated left ventricle. The left ventricular  diastology could not be evaluated due to mitral valve replacement/repair. Left ventricular diastolic parameters are indeterminate. Akinesis of the mi to apical inferior, apex and apical septal myocardium. Left Atrium: Left atrial size was severely dilated. Mitral Valve: The mitral valve has been repaired/replaced. Moderate mitral valve stenosis by observation. MV Area by PHT, 1.47 cm. MV PHT, 149.20 msec. MV peak gradient, 18.4 mmHg. Mild mitral valve regurgitation. Mean bioprosthetic mitral valve gradient 8 mmHg. Mean gradient 5 mmHg on 10/2017. Aortic Valve: The aortic valve is tricuspid. Aortic valve regurgitation is mild. Pulmonic Valve: Pulmonic valve regurgitation is mild by color flow Doppler. Pulmonic regurgitation is mild by color flow Doppler. Venous: The inferior vena cava is dilated in size with greater than 50% respiratory variability, suggesting right atrial pressure of 8 mmHg. Additional Comments: A pacer wire is visualized.  LEFT VENTRICLE          Normals PLAX 2D LVIDd:         5.69 cm  3.6 cm LVIDs:         4.04 cm  1.7 cm LV PW:         1.95 cm  1.4 cm LV IVS:        0.92 cm  1.3 cm LVOT diam:     1.70 cm  2.0 cm LV SV:         88 ml    79 ml LV SV Index:   54.78    45 ml/m2 LVOT Area:     2.27 cm 3.14 cm2  RIGHT VENTRICLE          IVC RV Basal diam:  2.71 cm  IVC diam: 2.25 cm LEFT ATRIUM              Index       RIGHT ATRIUM           Index  LA diam:        4.10 cm  2.56 cm/m  RA Area:     12.90 cm LA Vol (A2C):   109.0 ml 68.03 ml/m RA Volume:   29.40 ml  18.35 ml/m LA Vol (A4C):   59.3 ml  37.01 ml/m LA Biplane Vol: 81.9 ml  51.12 ml/m  AORTIC VALVE             Normals LVOT Vmax:   107.00 cm/s LVOT Vmean:  62.600 cm/s 75 cm/s LVOT VTI:    0.202 m     25.3 cm  AORTA  Normals Ao Root diam: 3.30 cm 31 mm Ao Asc diam:  2.70 cm 31 mm MITRAL VALVE               Normals MV Area (PHT): 1.47 cm             SHUNTS MV Peak grad:  18.4 mmHg   4 mmHg   Systemic VTI:  0.20 m MV Mean grad:  6.5 mmHg             Systemic Diam: 1.70 cm MV Vmax:       2.14 m/s MV Vmean:      113.5 cm/s MV VTI:        0.70 m MV PHT:        149.20 msec 55 ms MV Decel Time: 515 msec    187 ms MV E velocity: 175.50 cm/s 103 cm/s  Chilton Si MD Electronically signed by Chilton Si MD Signature Date/Time: 07/03/2019/1:12:32 PMThe mitral valve has been repaired/replaced.    Final         Scheduled Meds: . apixaban  5 mg Oral BID  . arformoterol  15 mcg Nebulization BID  . atorvastatin  40 mg Oral QPM  . budesonide  0.5 mg Nebulization BID  . digoxin  0.125 mg Oral Daily  . insulin aspart  0-9 Units Subcutaneous Q4H  . lactulose  30 g Oral Daily  . multivitamin with minerals  1 tablet Oral QHS  . sodium chloride flush  3 mL Intravenous Q12H  . sodium zirconium cyclosilicate  10 g Oral Once  . thiamine  100 mg Oral QHS   Continuous Infusions: . sodium chloride    . sodium chloride    . piperacillin-tazobactam (ZOSYN)  IV 3.375 g (07/04/19 0538)     LOS: 1 day    Time spent: 30 minutes    Linas Stepter Hoover Brunette, DO [name] Pager [phone]  If 7PM-7AM, please contact night-coverage [email or web address] Password [facility] 07/04/2019, 9:38 AM

## 2019-07-04 NOTE — TOC Initial Note (Signed)
Transition of Care [facility]) - Initial/Assessment Note    Patient Details  Name: [name] MRN: [id number] Date of Birth: 07-Aug-1945  Transition of Care [facility]) CM/SW Contact:    Bethena Roys, RN Phone Number: 07/04/2019, 4:24 PM  Clinical Narrative: Patient presented for Acute Respiratory Failure with Hypoxia. Prior to arrival patient was from home with family support. Patient will return to his address and daughter Donella Stade is the support person. Case Manager discussed [place] with the patient and daughter- they want to use [facility] for Services. Referral given to Liaison and start of care to begin within 24-48 hours post transition home. Patient has hospital bed, rolling walker with seat and oxygen in the home via [employer]. durable medical equipment (DME) needed is an overbed table and 3n1. [facility] Liaison is aware of DME. Patient's family will provide transportation home. Case Manager will continue to follow for transition of care needs.                    Expected Discharge Plan: Home w Hospice Care Barriers to Discharge: No Barriers Identified   Patient Goals and CMS Choice Patient states their goals for this hospitalization and ongoing recovery are:: "to return home with hospice"   Choice offered to / list presented to : Adult Children(Daughter knew she wanted to use [facility]- she didn't need list.)  Expected Discharge Plan and Services Expected Discharge Plan: Home w Hospice Care In-house Referral: NA Discharge Planning Services: CM Consult Post Acute Care Choice: Durable Medical Equipment, Hospice Living arrangements for the past 2 months: Single Family Home                 HH Arranged: RN [place] Agency: Other - See comment([facility]) Date HH Agency Contacted: 07/04/19 Time [place]: 1623 Representative spoke with at [place]: Dorian Pod  Prior Living Arrangements/Services Living arrangements for the past 2 months: [place] Lives  with:: Adult Children Patient language and need for interpreter reviewed:: Yes Do you feel safe going back to the place where you live?: Yes      Need for Family Participation in Patient Care: Yes (Comment) Care giver support system in place?: Yes (comment) Current home services: DME(Patient has a hospital bed, Rolling walker with seat, Oxygen with [employer].) Criminal Activity/Legal Involvement Pertinent to Current Situation/Hospitalization: No - Comment as needed  Activities of Daily Living      Permission Sought/Granted Permission sought to share information with : Case Manager, Family Supports, Customer service manager    Share Information with NAME: Teacher, music- daughter  Permission granted to share info w AGENCY: [facility]        Emotional Assessment Appearance:: Appears stated age Attitude/Demeanor/Rapport: Engaged Affect (typically observed): Accepting Orientation: : Oriented to Self, Oriented to Place Alcohol / Substance Use: Not Applicable Psych Involvement: No (comment)  Admission diagnosis:  Ventral hernia without obstruction or gangrene [K43.9] Acute respiratory failure with hypoxia (HCC) [J96.01] Acute on chronic respiratory failure with hypoxia (HCC) [J96.21] Acute on chronic congestive heart failure, unspecified heart failure type ([place]) [I50.9] Acute hypoxemic respiratory failure ([place]) [J96.01] Patient Active Problem List   Diagnosis Date Noted  . Acute respiratory failure with hypoxia ([place]) 07/03/2019  . Aspiration pneumonia ([place]) 07/03/2019  . Severe protein-calorie malnutrition ([place]) 07/03/2019  . Acute hypoxemic respiratory failure ([place]) 07/03/2019  . Uncontrolled type 2 diabetes mellitus with stage 3 chronic kidney disease, with long-term current use of insulin ([place]) 06/02/2019  . Uncontrolled type 2 diabetes mellitus  with hyperglycemia (HCC) 03/04/2018  . Acute on chronic respiratory failure with hypercapnia (HCC)   . COPD with chronic bronchitis  and emphysema (HCC)   . Acute encephalopathy   . Acute respiratory failure (HCC)   . Acute on chronic combined systolic (congestive) and diastolic (congestive) heart failure (HCC) 10/21/2017  . Chronic hypoxemic respiratory failure (HCC) 10/21/2017  . Bipolar disorder (HCC) 10/21/2017  . Chronic atrial fibrillation (HCC) 10/21/2017  . History of CVA (cerebrovascular accident) 10/21/2017  . COPD (chronic obstructive pulmonary disease) (HCC) 10/21/2017  . Tobacco abuse 10/21/2017  . History of alcohol use 10/21/2017  . Biventricular automatic implantable cardioverter defibrillator in situ 10/21/2017  . Dementia  10/21/2017  . History of mitral valve replacement 10/21/2017  . Coronary artery disease 10/21/2017  . Situational depression 10/21/2017  . Grief reaction 10/21/2017  . Chronic respiratory failure with hypoxia and hypercapnia (HCC) 06/22/2017  . CHF exacerbation (HCC) 06/06/2017  . Acute respiratory failure with hypoxia and hypercapnia (HCC)   . COPD GOLD 0 still smoking  03/02/2016  . Chronic kidney disease (CKD), stage II (mild) 11/23/2015  . CAD (coronary artery disease)   . Dementia (HCC)   . Chronic respiratory failure with hypoxia (HCC) 10/11/2015  . Chronic systolic CHF (congestive heart failure) (HCC) 05/30/2015  . Cigarette smoker 12/24/2014  . Stroke (HCC)   . Mitral valve disease   . Depression   . Mixed Ischemic and Nonischemic Cardiomyopathy   . Alcohol abuse   . Chronic anticoagulation 08/22/2014  . PVD (peripheral vascular disease) (HCC) 02/28/2014  . Mural thrombus of heart 02/07/2014  . Hypertension 06/14/2012  . Automatic implantable cardioverter-defibrillator in situ 04/04/2012  . Hyperlipidemia 10/27/2011  . Ventricular tachycardia (HCC) 07/14/2011  . Atrial fibrillation, chronic (HCC) 07/13/2011  . Right atrial thrombus (HCC) 07/11/2011   PCP:  Jeoffrey Massed, MD Pharmacy:   [phone] - [place], [place] - [address] [place] [address] Phone: [phone] Fax: [phone]     Social Determinants of Health (SDOH) Interventions    Readmission Risk Interventions Readmission Risk Prevention Plan 07/04/2019  Transportation Screening Complete  PCP or Specialist Appt within 3-5 Days Complete  HRI or Home Care Consult Complete  Social Work Consult for Recovery Care Planning/Counseling Complete  Palliative Care Screening Complete  Medication Review Oceanographer) Complete  Some recent data might be hidden
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
## 2019-07-04 NOTE — Progress Notes (Signed)
Inpatient Diabetes Program Recommendations  AACE/ADA: New Consensus Statement on Inpatient Glycemic Control (2015)  Target Ranges:  Prepandial:   less than 140 mg/dL      Peak postprandial:   less than 180 mg/dL (1-2 hours)      Critically ill patients:  140 - 180 mg/dL   Lab Results  Component Value Date   GLUCAP 313 (H) 07/04/2019   HGBA1C 11.1 (H) 05/30/2019    Review of Glycemic Control  Diabetes history: DM2 Outpatient Diabetes medications: Levemir 36 units QHS, Humalog 0-12 units tidwc, glipizide 2.5 mg QD Current orders for Inpatient glycemic control: Novolog 0-9 units Q4H.  HgbA1C - 11.1%  Inpatient Diabetes Program Recommendations:     Add Levemir 12 units QHS Add Novolog 3 units tidwc for meal coverage insulin  Continue to follow.  Thank you. Ailene Ards, RD, LDN, CDE Inpatient Diabetes Coordinator [phone]

## 2019-07-05 DIAGNOSIS — Z7189 Other specified counseling: Secondary | ICD-10-CM

## 2019-07-05 DIAGNOSIS — I482 Chronic atrial fibrillation, unspecified: Secondary | ICD-10-CM

## 2019-07-05 DIAGNOSIS — I509 Heart failure, unspecified: Secondary | ICD-10-CM

## 2019-07-05 DIAGNOSIS — I5022 Chronic systolic (congestive) heart failure: Secondary | ICD-10-CM

## 2019-07-05 DIAGNOSIS — J690 Pneumonitis due to inhalation of food and vomit: Principal | ICD-10-CM

## 2019-07-05 DIAGNOSIS — Z515 Encounter for palliative care: Secondary | ICD-10-CM

## 2019-07-05 DIAGNOSIS — J9611 Chronic respiratory failure with hypoxia: Secondary | ICD-10-CM

## 2019-07-05 DIAGNOSIS — J9601 Acute respiratory failure with hypoxia: Secondary | ICD-10-CM

## 2019-07-05 LAB — CBC
HCT: 32.6 % — ABNORMAL LOW (ref 39.0–52.0)
Hemoglobin: 9.5 g/dL — ABNORMAL LOW (ref 13.0–17.0)
MCH: 27.6 pg (ref 26.0–34.0)
MCHC: 29.1 g/dL — ABNORMAL LOW (ref 30.0–36.0)
MCV: 94.8 fL (ref 80.0–100.0)
Platelets: 270 10*3/uL (ref 150–400)
RBC: 3.44 MIL/uL — ABNORMAL LOW (ref 4.22–5.81)
RDW: 15 % (ref 11.5–15.5)
WBC: 14 10*3/uL — ABNORMAL HIGH (ref 4.0–10.5)
nRBC: 0 % (ref 0.0–0.2)

## 2019-07-05 LAB — BASIC METABOLIC PANEL
Anion gap: 8 (ref 5–15)
BUN: 41 mg/dL — ABNORMAL HIGH (ref 8–23)
CO2: 34 mmol/L — ABNORMAL HIGH (ref 22–32)
Calcium: 9.1 mg/dL (ref 8.9–10.3)
Chloride: 94 mmol/L — ABNORMAL LOW (ref 98–111)
Creatinine, Ser: 1.64 mg/dL — ABNORMAL HIGH (ref 0.61–1.24)
GFR calc Af Amer: 48 mL/min — ABNORMAL LOW (ref >60)
GFR calc non Af Amer: 41 mL/min — ABNORMAL LOW (ref >60)
Glucose, Bld: 239 mg/dL — ABNORMAL HIGH (ref 70–99)
Potassium: 5 mmol/L (ref 3.5–5.1)
Sodium: 136 mmol/L (ref 135–145)

## 2019-07-05 LAB — AMMONIA: Ammonia: 40 umol/L — ABNORMAL HIGH (ref 9–35)

## 2019-07-05 LAB — GLUCOSE, CAPILLARY
Glucose-Capillary: 164 mg/dL — ABNORMAL HIGH (ref 70–99)
Glucose-Capillary: 198 mg/dL — ABNORMAL HIGH (ref 70–99)
Glucose-Capillary: 228 mg/dL — ABNORMAL HIGH (ref 70–99)
Glucose-Capillary: 299 mg/dL — ABNORMAL HIGH (ref 70–99)

## 2019-07-05 LAB — MAGNESIUM: Magnesium: 2.7 mg/dL — ABNORMAL HIGH (ref 1.7–2.4)

## 2019-07-05 NOTE — Progress Notes (Signed)
Patient discharged to home with hospice. Daughter given discharge instructions and will transport patient.

## 2019-07-05 NOTE — Progress Notes (Signed)
Daily Progress Note   Patient Name: [name]       Date: 07/05/2019 DOB: Nov 08, 1945  Age: 74 y.o. MRN#: [id number] Attending Physician: Lurene Shadow, MD Primary Care Physician: Jeoffrey Massed, MD Admit Date: 07/02/2019  Reason for Consultation/Follow-up: Establishing goals of care  Subjective: Patient in bed asleep. Per care manager- referral has been made to Hospice.   ROS  Length of Stay: 2  Current Medications: Scheduled Meds:  . apixaban  5 mg Oral BID  . arformoterol  15 mcg Nebulization BID  . atorvastatin  40 mg Oral QPM  . budesonide  0.5 mg Nebulization BID  . digoxin  0.125 mg Oral Daily  . lactulose  30 g Oral Daily  . multivitamin with minerals  1 tablet Oral QHS  . sodium chloride flush  3 mL Intravenous Q12H  . thiamine  100 mg Oral QHS    Continuous Infusions: . sodium chloride    . piperacillin-tazobactam (ZOSYN)  IV 3.375 g (07/05/19 0509)    PRN Meds: sodium chloride, acetaminophen **OR** acetaminophen, LORazepam **OR** LORazepam **OR** LORazepam, morphine CONCENTRATE **OR** morphine CONCENTRATE, sodium chloride flush  Physical Exam          Vital Signs: BP 111/65 (BP Location: Right Arm)   Pulse (!) 58   Temp 97.9 F (36.6 C) (Oral)   Resp 19   Ht 5\' 4"  (1.626 m)   Wt 70.6 kg   SpO2 98%   BMI 26.72 kg/m  SpO2: SpO2: 98 % O2 Device: O2 Device: Nasal Cannula O2 Flow Rate: O2 Flow Rate (L/min): 4 L/min  Intake/output summary:   Intake/Output Summary (Last 24 hours) at 07/05/2019 1108 Last data filed at 07/04/2019 2000 Gross per 24 hour  Intake 340 ml  Output --  Net 340 ml   LBM: Last BM Date: 07/03/19 Baseline Weight: Weight: 67.5 kg Most recent weight: Weight: 70.6 kg       Palliative Assessment/Data: PPS:  20%     Patient Active Problem List   Diagnosis Date Noted  . Acute respiratory failure with hypoxia (HCC) 07/03/2019  . Aspiration pneumonia (HCC) 07/03/2019  . Severe protein-calorie malnutrition (HCC) 07/03/2019  . Acute hypoxemic respiratory failure (HCC) 07/03/2019  . Uncontrolled type 2 diabetes mellitus with stage 3 chronic kidney disease, with long-term current use of insulin (  [place]) 06/02/2019  . Uncontrolled type 2 diabetes mellitus with hyperglycemia ([place]) 03/04/2018  . Acute on chronic respiratory failure with hypercapnia ([place])   . COPD with chronic bronchitis and emphysema ([place])   . Acute encephalopathy   . Acute respiratory failure ([place])   . Acute on chronic combined systolic (congestive) and diastolic (congestive) heart failure ([place]) 10/21/2017  . Chronic hypoxemic respiratory failure ([place]) 10/21/2017  . Bipolar disorder ([place]) 10/21/2017  . Chronic atrial fibrillation ([place]) 10/21/2017  . History of CVA (cerebrovascular accident) 10/21/2017  . COPD (chronic obstructive pulmonary disease) ([place]) 10/21/2017  . Tobacco abuse 10/21/2017  . History of alcohol use 10/21/2017  . Biventricular automatic implantable cardioverter defibrillator in situ 10/21/2017  . Dementia  10/21/2017  . History of mitral valve replacement 10/21/2017  . Coronary artery disease 10/21/2017  . Situational depression 10/21/2017  . Grief reaction 10/21/2017  . Chronic respiratory failure with hypoxia and hypercapnia ([place]) 06/22/2017  . CHF exacerbation ([name]) 06/06/2017  . Acute respiratory failure with hypoxia and hypercapnia (HCC)   . COPD GOLD 0 still smoking  03/02/2016  . Chronic kidney disease (CKD), stage II (mild) 11/23/2015  . CAD (coronary artery disease)   . Dementia ([place])   . Chronic respiratory failure with hypoxia ([place]) 10/11/2015  . Chronic systolic CHF (congestive heart failure) ([place]) 05/30/2015  . Cigarette smoker 12/24/2014  . Stroke ([place])   . Mitral valve disease   .  Depression   . Mixed Ischemic and Nonischemic Cardiomyopathy   . Alcohol abuse   . Chronic anticoagulation 08/22/2014  . PVD (peripheral vascular disease) ([place]) 02/28/2014  . [place]ural thrombus of heart 02/07/2014  . Hypertension 06/14/2012  . Automatic implantable cardioverter-defibrillator in situ 04/04/2012  . Hyperlipidemia 10/27/2011  . Ventricular tachycardia ([place]) 07/14/2011  . Atrial fibrillation, chronic ([place]) 07/13/2011  . Right atrial thrombus ([place]) 07/11/2011    Palliative Care Assessment & Plan   Patient Profile: 74 y.o. male  with past medical history of dementia, multiple CVAs, CKD3, HTN, hyperlipidemia, CAD, CHF (EF 30%), bioprosthetic MVR, COPD- chronic resp failure on home O2, OSA on CPAP, COVID-19 admission 05/2019 admitted on 07/02/2019 with respiratory failure due to aspiration pneumonia. Ongoing evaluation shows persistent hypercapnia and dysphagia. Palliative medicine consulted for goals of care.   Assessment/Recommendations/Plan   Plan for d/c home with Hospice and comfort measures  D/C'd antibiotic, CBG checks and insulin  Called patient's daughter [name]- left message   Goals of Care and Additional Recommendations:  Limitations on Scope of Treatment: Full Comfort Care  Code Status:  DNR  Prognosis:   < 6 weeks due to aspiration pneumonia, CHF, COPD, Covid deconditioning, patient not walking, eating or drinking, sleeping more than awake  Discharge Planning:  Home with Hospice  Care plan was discussed with patient's RN and care management. Left message for patient's daughter.   Thank you for allowing the Palliative Medicine Team to assist in the care of this patient.   Time In: 1100 Time Out: 1135 Total Time 35 mins Prolonged Time Billed no      Greater than 50%  of this time was spent counseling and coordinating care related to the above assessment and plan.  Mariana Kaufman, AGNP-C Palliative Medicine   Please contact Palliative Medicine  Team phone at [phone] for questions and concerns.

## 2019-07-05 NOTE — Consult Note (Signed)
   [facility] CM Inpatient Consult   07/05/2019  JERRALD DOVERSPIKE January 04, 1946 [id number]     Patient reviewed for readmission less than 30 days with 26% high risk score for unplanned readmission and hospitalizations; and needs for [employer] care management services under his [employer] benefit.  Brief chart review of transition of care CM note and Palliative care consult reveal that patient is to return home with Hospice care [facility]).  Patient will receive full care management services under the Hospice program.   No needs identified for [facility] Care Management at this time.    For questions, please call:  Karin Golden A. Kashari Chalmers, BSN, RN-BC [facility] Liaison Cell: [phone]

## 2019-07-05 NOTE — Discharge Summary (Signed)
Physician Discharge Summary  [name]:[id number] DOB: October 01, 1945 DOA: 07/02/2019  PCP: Jeoffrey Massed, MD  Admit date: 07/02/2019 Discharge date: 07/05/2019  Discharge disposition: Home with hospice   Recommendations for Outpatient Follow-Up:   Follow-up with hospice team at discharge   Discharge Diagnosis:   Principal Problem:   Acute respiratory failure with hypoxia [facility]) Active Problems:   Atrial fibrillation, chronic (HCC)   Hyperlipidemia   Hypertension   Chronic systolic CHF (congestive heart failure) (HCC)   Chronic respiratory failure with hypoxia (HCC)   Dementia (HCC)   Uncontrolled type 2 diabetes mellitus with hyperglycemia (HCC)   Aspiration pneumonia (HCC)   Severe protein-calorie malnutrition (HCC)   Acute hypoxemic respiratory failure (HCC)    Discharge Condition: Stable.  Diet recommendation: Regular diet  Code status: DNR.    Hospital Course:   [name] is a 74 year old man male with medical history significant for dementia, Psoriasis, hypertension, hyperlipidemia, Dm2, CKD stage3, CAD, s/p DES LCx, Chronic Afib, Chronic systolic CHF(EF 30%), s/p AICD, bioprosthetic MVR, COPD (gold 3), chronic hypoxemic and hypercapnic respiratory failure on home oxygen 2-4L [place], OSA prev on CPAP,Covid-19 admission 05/28/19, presented to the hospital because of choking.  Reportedly, he was drinking up on his mucous secretions.  He also had an episode of nausea and vomiting. He was hypoxemic with oxygen saturation of 88% on 4 L of oxygen which increased to 98% after being placed on 15 L/min oxygen via nonrebreathing mask.   He was admitted to the hospital for acute on chronic hypoxemic respiratory failure secondary to aspiration pneumonia.  He was treated with IV antibiotics.  He also had hypokalemia that was treated with [facility].  He was also given IV fluids for acute kidney injury.  The patient was evaluated by the palliative care team and  patient and his daughter, [name], requested comfort measures with hospice.  His daughter requested that patient be discharged home today with hospice.  Discharge plan was discussed with [name], his daughter, who said she wanted all his medications to be discontinued at discharge.     Discharge Exam:   Vitals:   07/05/19 0819 07/05/19 1030  BP:    Pulse:  (!) 58  Resp:    Temp:    SpO2: 98%    Vitals:   07/04/19 2039 07/05/19 0405 07/05/19 0819 07/05/19 1030  BP: (!) 123/59 111/65    Pulse: 60 (!) 59  (!) 58  Resp: (!) 22 19    Temp: 97.9 F (36.6 C) 97.9 F (36.6 C)    TempSrc: Oral Oral    SpO2: 95% 98% 98%   Weight:  70.6 kg    Height:         GEN: NAD SKIN: No rash EYES: EOMI ENT: MMM CV: RRR PULM: bibasilar rales ABD: soft, ND, NT, +BS CNS: AAO x 2 (person and place), non focal EXT: No edema or tenderness   The results of significant diagnostics from this hospitalization (including imaging, microbiology, ancillary and laboratory) are listed below for reference.     Procedures and Diagnostic Studies:   CT Angio Chest PE W and/or Wo Contrast  Result Date: 07/03/2019 CLINICAL DATA:  COVID-19 positive, cough, sudden episode of emesis while eating EXAM: CT ANGIOGRAPHY CHEST CT ABDOMEN AND PELVIS WITH CONTRAST TECHNIQUE: Multidetector CT imaging of the chest was performed using the standard protocol during bolus administration of intravenous contrast. Multiplanar CT image reconstructions and MIPs were obtained to evaluate the vascular anatomy. Multidetector  CT imaging of the abdomen and pelvis was performed using the standard protocol during bolus administration of intravenous contrast. CONTRAST:  OMNIPAQUE IOHEXOL 350 MG/ML SOLN COMPARISON:  CT abdomen pelvis Oct 26, 2011, August 23, 2017, chest radiograph 07/02/2019, CTA chest 02/07/2014 FINDINGS: CTA CHEST FINDINGS Cardiovascular: Satisfactory opacification the pulmonary arteries to the segmental level. No  pulmonary artery filling defects are identified. Central pulmonary arterial enlargement is similar to comparison exams. There is stable cardiomegaly with predominantly left cardiac enlargement including left atrial enlargement. There is a partially calcified filling defect in the left atrium favoring a thrombus given interval decrease in size since September 2015 comparison CT angiography. There has been slight interval decrease from the more recent comparison CT abdomen pelvis 08/23/2017 now measuring 22 x 16 mm, previously 22 x 35 mm at a similar level. A bioprosthetic mitral valve replacement is noted. Numerous cardiac pacer/defibrillator leads are present positioned in the right atrium, right ventricle, and left ventricle. Battery pack overlies the left chest wall. Evidence of prior CABG with extensive coronary artery calcifications of the native vessels. Pericardial calcification may be postsurgical in nature or related to prior effusion. Additional atherosclerotic plaque within the normal caliber aorta. Normal 3 vessel branching of the aortic arch. Proximal great vessels are unremarkable. Mediastinum/Nodes: Multiple enlarged mediastinal and hilar nodes are present these include a 10 mm right hilar node (3/68), a 16 mm subcarinal node (3/64), and a 16 mm right paratracheal lymph node (3/39). No acute abnormality of the trachea or esophagus. Thoracic inlet is largely obscured by streak artifact from patient's contrast bolus and pacer pack no gross abnormalities are seen. Lungs/Pleura: Diffuse interlobular septal and fissural thickening is noted as well as extensive peribronchovascular thickening. Multifocal areas of ground-glass opacity and mixed airspace disease are present throughout both lungs. More consolidative opacities have a somewhat peripheral lysed appearance. There are few calcified pleural plaque seen posteriorly in both lungs. No convincing pleural effusion. No visible pneumothorax. Musculoskeletal:  Multilevel degenerative changes are present in the imaged portions of the spine. Post sternotomy changes with intact sternal sutures. Left chest wall battery pack. Mild bilateral gynecomastia. Review of the MIP images confirms the above findings. CT ABDOMEN and PELVIS FINDINGS Hepatobiliary: No focal liver lesions. Smooth hepatic surface contour. Calcified gallstone is present within the normally distended gallbladder. No pericholecystic fluid or inflammation is seen. Some irregular thickening at the tip of the gallbladder may reflect adenomyomatosis and appears similar to comparison studies. No biliary ductal dilatation. Pancreas: Unremarkable. No pancreatic ductal dilatation or surrounding inflammatory changes. Spleen: Normal in size without focal abnormality. Adrenals/Urinary Tract: Normal adrenal glands. Few subcentimeter hypoattenuating foci are present in both kidneys with asymmetric left renal atrophy and bilateral cortical scarring. No worrisome renal lesions. No urolithiasis or hydronephrosis is seen. Urinary bladder is unremarkable. Stomach/Bowel: Small hiatal hernia. Distal stomach and duodenum are unremarkable. There is a slightly patulous, air-filled loop of small bowel in the mid abdomen which appears to demonstrate and anastomotic line and may reflect prior small bowel to small bowel anastomosis. There is herniation of the anti mesenteric wall of this segment through an infraumbilical ventral hernia but without evidence of mechanical obstruction or vascular compromise. This is slightly increased in size from comparison exam. A normal appendix is visualized. No colonic dilatation or wall thickening. Scattered colonic diverticula without focal pericolonic inflammation to suggest diverticulitis. Vascular/Lymphatic: Atherosclerotic plaque within the normal caliber aorta and branch vessels. No aneurysm or ectasia is seen. No worrisome abdominopelvic lymph nodes. Reproductive: The  prostate and seminal  vesicles are unremarkable. Other: No abdominopelvic free fluid or free gas. No bowel containing hernias. Richter's hernia containing portion of the anti mesenteric small bowel, as detailed above. No other bowel containing hernias. Bilateral fat containing inguinal hernias. Musculoskeletal: Multilevel degenerative changes are present in the imaged portions of the spine. Remote compression deformity of L4 is similar to prior. No acute or suspicious osseous lesion. Review of the MIP images confirms the above findings. IMPRESSION: CTA CHEST 1. No evidence of acute pulmonary artery filling defect to suggest pulmonary embolus. 2. There is a partially calcified filling defect in the left atrium favoring a thrombus given interval decrease in size since September 2015 comparison CT abdomen pelvis. 3. Multifocal areas of ground-glass opacity and mixed airspace disease throughout both lungs. The more consolidative opacities have a somewhat peripheral appearance. Favor some residual infectious consolidation or residual opacity from patient's known COVID-19 diagnosis with superimposed edema/heart failure. 4. Mediastinal and hilar lymphadenopathy is likely reactive. 5. Cardiomegaly with predominantly left atrial ventricular enlargement and prior bioprosthetic mitral valve replacement. 6. Evidence of prior CABG with extensive coronary artery calcifications of the native vessels. CT ABDOMEN AND PELVIS 1. No acute findings in the abdomen or pelvis to explain the patient's symptoms. 2. Colonic diverticulosis without evidence of diverticulitis. 3. Richter's hernia containing portion of the anti mesenteric small bowel. No evidence of mechanical obstruction or vascular compromise. The herniated portion of bowel appears to correspond to a chronically patulous segment of small bowel with surgical material likely reflecting prior anastomosis. 4. Bilateral fat containing inguinal hernias. 5. Cholelithiasis without evidence of acute  cholecystitis. Thickening at the tip of the gallbladder may reflect adenomyomatosis, could consider further evaluation with nonemergent, outpatient right upper quadrant ultrasound These results were called by telephone at the time of interpretation on 07/03/2019 at 3:36 am to provider [facility], who verbally acknowledged these results. Electronically Signed   By: Kreg Shropshire M.D.   On: 07/03/2019 03:37   CT ABDOMEN PELVIS W CONTRAST  Result Date: 07/03/2019 CLINICAL DATA:  COVID-19 positive, cough, sudden episode of emesis while eating EXAM: CT ANGIOGRAPHY CHEST CT ABDOMEN AND PELVIS WITH CONTRAST TECHNIQUE: Multidetector CT imaging of the chest was performed using the standard protocol during bolus administration of intravenous contrast. Multiplanar CT image reconstructions and MIPs were obtained to evaluate the vascular anatomy. Multidetector CT imaging of the abdomen and pelvis was performed using the standard protocol during bolus administration of intravenous contrast. CONTRAST:  OMNIPAQUE IOHEXOL 350 MG/ML SOLN COMPARISON:  CT abdomen pelvis Oct 26, 2011, August 23, 2017, chest radiograph 07/02/2019, CTA chest 02/07/2014 FINDINGS: CTA CHEST FINDINGS Cardiovascular: Satisfactory opacification the pulmonary arteries to the segmental level. No pulmonary artery filling defects are identified. Central pulmonary arterial enlargement is similar to comparison exams. There is stable cardiomegaly with predominantly left cardiac enlargement including left atrial enlargement. There is a partially calcified filling defect in the left atrium favoring a thrombus given interval decrease in size since September 2015 comparison CT angiography. There has been slight interval decrease from the more recent comparison CT abdomen pelvis 08/23/2017 now measuring 22 x 16 mm, previously 22 x 35 mm at a similar level. A bioprosthetic mitral valve replacement is noted. Numerous cardiac pacer/defibrillator leads are present  positioned in the right atrium, right ventricle, and left ventricle. Battery pack overlies the left chest wall. Evidence of prior CABG with extensive coronary artery calcifications of the native vessels. Pericardial calcification may be postsurgical in nature or related  to prior effusion. Additional atherosclerotic plaque within the normal caliber aorta. Normal 3 vessel branching of the aortic arch. Proximal great vessels are unremarkable. Mediastinum/Nodes: Multiple enlarged mediastinal and hilar nodes are present these include a 10 mm right hilar node (3/68), a 16 mm subcarinal node (3/64), and a 16 mm right paratracheal lymph node (3/39). No acute abnormality of the trachea or esophagus. Thoracic inlet is largely obscured by streak artifact from patient's contrast bolus and pacer pack no gross abnormalities are seen. Lungs/Pleura: Diffuse interlobular septal and fissural thickening is noted as well as extensive peribronchovascular thickening. Multifocal areas of ground-glass opacity and mixed airspace disease are present throughout both lungs. More consolidative opacities have a somewhat peripheral lysed appearance. There are few calcified pleural plaque seen posteriorly in both lungs. No convincing pleural effusion. No visible pneumothorax. Musculoskeletal: Multilevel degenerative changes are present in the imaged portions of the spine. Post sternotomy changes with intact sternal sutures. Left chest wall battery pack. Mild bilateral gynecomastia. Review of the MIP images confirms the above findings. CT ABDOMEN and PELVIS FINDINGS Hepatobiliary: No focal liver lesions. Smooth hepatic surface contour. Calcified gallstone is present within the normally distended gallbladder. No pericholecystic fluid or inflammation is seen. Some irregular thickening at the tip of the gallbladder may reflect adenomyomatosis and appears similar to comparison studies. No biliary ductal dilatation. Pancreas: Unremarkable. No pancreatic  ductal dilatation or surrounding inflammatory changes. Spleen: Normal in size without focal abnormality. Adrenals/Urinary Tract: Normal adrenal glands. Few subcentimeter hypoattenuating foci are present in both kidneys with asymmetric left renal atrophy and bilateral cortical scarring. No worrisome renal lesions. No urolithiasis or hydronephrosis is seen. Urinary bladder is unremarkable. Stomach/Bowel: Small hiatal hernia. Distal stomach and duodenum are unremarkable. There is a slightly patulous, air-filled loop of small bowel in the mid abdomen which appears to demonstrate and anastomotic line and may reflect prior small bowel to small bowel anastomosis. There is herniation of the anti mesenteric wall of this segment through an infraumbilical ventral hernia but without evidence of mechanical obstruction or vascular compromise. This is slightly increased in size from comparison exam. A normal appendix is visualized. No colonic dilatation or wall thickening. Scattered colonic diverticula without focal pericolonic inflammation to suggest diverticulitis. Vascular/Lymphatic: Atherosclerotic plaque within the normal caliber aorta and branch vessels. No aneurysm or ectasia is seen. No worrisome abdominopelvic lymph nodes. Reproductive: The prostate and seminal vesicles are unremarkable. Other: No abdominopelvic free fluid or free gas. No bowel containing hernias. Richter's hernia containing portion of the anti mesenteric small bowel, as detailed above. No other bowel containing hernias. Bilateral fat containing inguinal hernias. Musculoskeletal: Multilevel degenerative changes are present in the imaged portions of the spine. Remote compression deformity of L4 is similar to prior. No acute or suspicious osseous lesion. Review of the MIP images confirms the above findings. IMPRESSION: CTA CHEST 1. No evidence of acute pulmonary artery filling defect to suggest pulmonary embolus. 2. There is a partially calcified filling  defect in the left atrium favoring a thrombus given interval decrease in size since September 2015 comparison CT abdomen pelvis. 3. Multifocal areas of ground-glass opacity and mixed airspace disease throughout both lungs. The more consolidative opacities have a somewhat peripheral appearance. Favor some residual infectious consolidation or residual opacity from patient's known COVID-19 diagnosis with superimposed edema/heart failure. 4. Mediastinal and hilar lymphadenopathy is likely reactive. 5. Cardiomegaly with predominantly left atrial ventricular enlargement and prior bioprosthetic mitral valve replacement. 6. Evidence of prior CABG with extensive coronary artery calcifications of the  native vessels. CT ABDOMEN AND PELVIS 1. No acute findings in the abdomen or pelvis to explain the patient's symptoms. 2. Colonic diverticulosis without evidence of diverticulitis. 3. Richter's hernia containing portion of the anti mesenteric small bowel. No evidence of mechanical obstruction or vascular compromise. The herniated portion of bowel appears to correspond to a chronically patulous segment of small bowel with surgical material likely reflecting prior anastomosis. 4. Bilateral fat containing inguinal hernias. 5. Cholelithiasis without evidence of acute cholecystitis. Thickening at the tip of the gallbladder may reflect adenomyomatosis, could consider further evaluation with nonemergent, outpatient right upper quadrant ultrasound These results were called by telephone at the time of interpretation on 07/03/2019 at 3:36 am to provider [facility] PA, who verbally acknowledged these results. Electronically Signed   By: Kreg Shropshire M.D.   On: 07/03/2019 03:37   DG Chest Portable 1 View  Result Date: 07/02/2019 CLINICAL DATA:  Vomiting tonight. Rhonchi in the lower lobes. Cough. EXAM: PORTABLE CHEST 1 VIEW COMPARISON:  Chest x-rays dated 05/30/2019 and 05/26/2019 FINDINGS: Stable cardiomegaly. LEFT chest wall pacemaker  apparatus appears stable. Again noted are the hazy/interstitial airspace opacities bilaterally, not significantly changed compared to the most recent exams, and chronic central pulmonary vascular congestion, suggesting a chronic CHF. No new lung findings. No pneumothorax seen. Osseous structures about the chest are unremarkable. IMPRESSION: Probable chronic CHF. No acute findings. No evidence of superimposed pneumonia. Electronically Signed   By: Bary Richard M.D.   On: 07/02/2019 20:47   ECHOCARDIOGRAM LIMITED  Result Date: 07/03/2019   ECHOCARDIOGRAM LIMITED REPORT   Patient Name:   FERRON ISHMAEL [facility] Date of Exam: 07/03/2019 Medical Rec #:  [id number]       Height:       64.0 in Accession #:    [id number]      Weight:       125.0 lb Date of Birth:  Dec 22, 1945       BSA:          1.60 m Patient Age:    74 years        BP:           113/52 mmHg Patient Gender: M               HR:           63 bpm. Exam Location:  Inpatient  Procedure: Cardiac Doppler, Limited Echo and Limited Color Doppler Indications:    Elevated Troponin.  History:        Patient has prior history of Echocardiogram examinations, most                 recent 10/23/2017. Mitral Valve: bioprosthetic valve is present                 in the mitral position. COVID19 Positive. Mitral Valve                 Replacement.  Sonographer:    Elmarie Shiley Dance Referring Phys: [id number] JAMES KIM IMPRESSIONS  1. Left ventricular ejection fraction, by visual estimation, is 30 to 35%.  2. Left ventricular diastolic parameters are indeterminate.  3. Mildly dilated left ventricular internal cavity size.  4. The left ventricle demonstrates regional wall motion abnormalities.  5. Akinesis of the mi to apical inferior, apex and apical septal myocardium.  6. Left atrial size was severely dilated.  7. The mitral valve has been repaired/replaced. Mild mitral valve regurgitation. Moderate mitral stenosis.  8. Pulmonic valve regurgitation is  mild by color flow Doppler.  9. A pacer  wire is visualized. 10. The inferior vena cava is dilated in size with >50% respiratory variability, suggesting right atrial pressure of 8 mmHg. FINDINGS  Left Ventricle: Left ventricular ejection fraction, by visual estimation, is 30 to 35%. The left ventricle demonstrates regional wall motion abnormalities. The left ventricular internal cavity size was mildly dilated left ventricle. The left ventricular  diastology could not be evaluated due to mitral valve replacement/repair. Left ventricular diastolic parameters are indeterminate. Akinesis of the mi to apical inferior, apex and apical septal myocardium. Left Atrium: Left atrial size was severely dilated. Mitral Valve: The mitral valve has been repaired/replaced. Moderate mitral valve stenosis by observation. MV Area by PHT, 1.47 cm. MV PHT, 149.20 msec. MV peak gradient, 18.4 mmHg. Mild mitral valve regurgitation. Mean bioprosthetic mitral valve gradient 8 mmHg. Mean gradient 5 mmHg on 10/2017. Aortic Valve: The aortic valve is tricuspid. Aortic valve regurgitation is mild. Pulmonic Valve: Pulmonic valve regurgitation is mild by color flow Doppler. Pulmonic regurgitation is mild by color flow Doppler. Venous: The inferior vena cava is dilated in size with greater than 50% respiratory variability, suggesting right atrial pressure of 8 mmHg. Additional Comments: A pacer wire is visualized.  LEFT VENTRICLE          Normals PLAX 2D LVIDd:         5.69 cm  3.6 cm LVIDs:         4.04 cm  1.7 cm LV PW:         1.95 cm  1.4 cm LV IVS:        0.92 cm  1.3 cm LVOT diam:     1.70 cm  2.0 cm LV SV:         88 ml    79 ml LV SV Index:   54.78    45 ml/m2 LVOT Area:     2.27 cm 3.14 cm2  RIGHT VENTRICLE          IVC RV Basal diam:  2.71 cm  IVC diam: 2.25 cm LEFT ATRIUM              Index       RIGHT ATRIUM           Index LA diam:        4.10 cm  2.56 cm/m  RA Area:     12.90 cm LA Vol (A2C):   109.0 ml 68.03 ml/m RA Volume:   29.40 ml  18.35 ml/m LA Vol (A4C):   59.3  ml  37.01 ml/m LA Biplane Vol: 81.9 ml  51.12 ml/m  AORTIC VALVE             Normals LVOT Vmax:   107.00 cm/s LVOT Vmean:  62.600 cm/s 75 cm/s LVOT VTI:    0.202 m     25.3 cm  AORTA                 Normals Ao Root diam: 3.30 cm 31 mm Ao Asc diam:  2.70 cm 31 mm MITRAL VALVE               Normals MV Area (PHT): 1.47 cm             SHUNTS MV Peak grad:  18.4 mmHg   4 mmHg   Systemic VTI:  0.20 m MV Mean grad:  6.5 mmHg             Systemic  Diam: 1.70 cm MV Vmax:       2.14 m/s MV Vmean:      113.5 cm/s MV VTI:        0.70 m MV PHT:        149.20 msec 55 ms MV Decel Time: 515 msec    187 ms MV E velocity: 175.50 cm/s 103 cm/s  Chilton Si MD Electronically signed by Chilton Si MD Signature Date/Time: 07/03/2019/1:12:32 PMThe mitral valve has been repaired/replaced.    Final      Labs:   Basic Metabolic Panel: Recent Labs  Lab 07/02/19 2100 07/02/19 2100 07/02/19 2110 07/02/19 2110 07/03/19 0451 07/03/19 0451 07/03/19 1617 07/03/19 1617 07/04/19 0413 07/05/19 0359  NA 141   < > 139  --  138  --  138  --  139 136  K 4.2   < > 4.7   < > 6.1*   < > 4.8   < > 5.2* 5.0  CL 100  --   --   --  94*  --  93*  --  95* 94*  CO2 32  --   --   --  34*  --  31  --  35* 34*  GLUCOSE 178*  --   --   --  257*  --  294*  --  195* 239*  BUN 31*  --   --   --  32*  --  33*  --  40* 41*  CREATININE 1.28*  --   --   --  1.42*  --  1.56*  --  1.70* 1.64*  CALCIUM 8.3*  --   --   --  9.1  --  9.3  --  9.2 9.1  MG  --   --   --   --   --   --   --   --   --  2.7*   < > = values in this interval not displayed.   GFR Estimated Creatinine Clearance: 34.1 mL/min (A) (by C-G formula based on SCr of 1.64 mg/dL (H)). Liver Function Tests: Recent Labs  Lab 07/02/19 2100 07/03/19 0451  AST 33 33  ALT 29 34  ALKPHOS 84 100  BILITOT 0.5 0.3  PROT 6.2* 7.4  ALBUMIN 2.8* 3.3*   No results for input(s): LIPASE, AMYLASE in the last 168 hours. Recent Labs  Lab 07/05/19 0359  AMMONIA 40*    Coagulation profile Recent Labs  Lab 07/02/19 2100  INR 1.5*    CBC: Recent Labs  Lab 07/02/19 2100 07/02/19 2110 07/03/19 0451 07/04/19 0413 07/05/19 0359  WBC 13.1*  --  17.4* 20.1* 14.0*  NEUTROABS 9.8*  --   --   --   --   HGB 9.6* 10.9* 10.8* 9.6* 9.5*  HCT 31.5* 32.0* 36.0* 31.4* 32.6*  MCV 95.2  --  95.0 93.2 94.8  PLT 242  --  269 267 270   Cardiac Enzymes: No results for input(s): CKTOTAL, CKMB, CKMBINDEX, TROPONINI in the last 168 hours. BNP: Invalid input(s): POCBNP CBG: Recent Labs  Lab 07/04/19 1635 07/04/19 2037 07/05/19 0026 07/05/19 0403 07/05/19 0740  GLUCAP 260* 199* 198* 228* 164*   D-Dimer No results for input(s): DDIMER in the last 72 hours. Hgb A1c No results for input(s): HGBA1C in the last 72 hours. Lipid Profile No results for input(s): CHOL, HDL, LDLCALC, TRIG, CHOLHDL, LDLDIRECT in the last 72 hours. Thyroid function studies No results for input(s): TSH, T4TOTAL, T3FREE, THYROIDAB in  the last 72 hours.  Invalid input(s): FREET3 Anemia work up No results for input(s): VITAMINB12, FOLATE, FERRITIN, TIBC, IRON, RETICCTPCT in the last 72 hours. Microbiology Recent Results (from the past 240 hour(s))  MRSA PCR Screening     Status: None   Collection Time: 07/04/19  8:52 AM   Specimen: Nasal Mucosa; Nasopharyngeal  Result Value Ref Range Status   MRSA by PCR NEGATIVE NEGATIVE Final    Comment:        The GeneXpert MRSA Assay (FDA approved for NASAL specimens only), is one component of a comprehensive MRSA colonization surveillance program. It is not intended to diagnose MRSA infection nor to guide or monitor treatment for MRSA infections. Performed at [facility], [place] [facility] [address] [place], [place] [address]      Discharge Instructions:   Discharge Instructions    Diet general   Complete by: As directed    Discharge instructions   Complete by: As directed    Follow up with hospice team at home     Allergies  as of 07/05/2019      Reactions   Warfarin Other (See Comments)   Non compliance and ETOH abuse      Medication List    STOP taking these medications   acetaminophen 325 MG tablet Commonly known as: TYLENOL   arformoterol 15 MCG/2ML Nebu Commonly known as: BROVANA   atorvastatin 40 MG tablet Commonly known as: LIPITOR   budesonide 0.5 MG/2ML nebulizer solution Commonly known as: PULMICORT   budesonide 180 MCG/ACT inhaler Commonly known as: PULMICORT   Dexcom G6 Sensor Misc   digoxin 0.125 MG tablet Commonly known as: LANOXIN   Eliquis 5 MG Tabs tablet Generic drug: apixaban   glipiZIDE 2.5 MG 24 hr tablet Commonly known as: GLUCOTROL XL   HumaLOG KwikPen 100 UNIT/ML KwikPen Generic drug: insulin lispro   insulin detemir 100 UNIT/ML injection Commonly known as: LEVEMIR   Insulin Pen Needle 31G X 8 MM Misc   Jardiance 10 MG Tabs tablet Generic drug: empagliflozin   lactulose 10 GM/15ML solution Commonly known as: CHRONULAC   multivitamin with minerals Tabs tablet   nitroGLYCERIN 0.4 MG SL tablet Commonly known as: NITROSTAT   thiamine 100 MG tablet   torsemide 20 MG tablet Commonly known as: Novice Follow up.   Why: Registered Nurse for [place]- office to call with visit time.  Contact information: [address], [place], [place] [address] [phone]           Time coordinating discharge: 33 minutes  Signed:  Jennye Boroughs  [name] 07/05/2019, 11:58 AM

## 2019-07-05 NOTE — Progress Notes (Signed)
[name] notified pt "[name]" being discharged with hospice care, verbalized agreement and understanding of plan of care.

## 2019-07-05 NOTE — TOC Transition Note (Signed)
Transition of Care [facility]) - CM/SW Discharge Note   Patient Details  Name: ISSAAC SHIPPER MRN: [id number] Date of Birth: 11-07-1945  [facility]) CM/SW Contact:  Gala Lewandowsky, RN Phone Number: 07/05/2019, 12:26 PM   Clinical Narrative: Case Manager called daughter [name] and she will pick the patient up around 1330 for transport home.  [facility] will contact the daughter regarding visit time from staff RN and the estimated time of arrival for durable medical equipment. No further needs from Case Manager.       Final next level of care: Home w Hospice Care Barriers to Discharge: No Barriers Identified   Patient Goals and CMS Choice Patient states their goals for this hospitalization and ongoing recovery are:: "to return home with hospice"   Choice offered to / list presented to : Adult Children(Daughter knew she wanted to use [facility]- she didn't need list.)   Discharge Plan and Services In-house Referral: NA Discharge Planning Services: CM Consult Post Acute Care Choice: Durable Medical Equipment, Hospice                HH Arranged: RN [facility] Agency: Other - See comment([facility]) Date HH Agency Contacted: 07/04/19 Time HH Agency Contacted: 1623 Representative spoke with at [facility] Agency: Alvino Chapel  Social Determinants of Health (SDOH) Interventions     Readmission Risk Interventions Readmission Risk Prevention Plan 07/04/2019  Transportation Screening Complete  PCP or Specialist Appt within 3-5 Days Complete  HRI or Home Care Consult Complete  Social Work Consult for Recovery Care Planning/Counseling Complete  Palliative Care Screening Complete  Medication Review [facility]) Complete  Some recent data might be hidden

## 2019-07-11 ENCOUNTER — Telehealth: Payer: Self-pay

## 2019-07-11 NOTE — Telephone Encounter (Signed)
Patient is under Hospice care. Patient is requesting surgery on his hernia. Hospitalist at [facility] Dr. Clelia Croft advised against the hernia. Patient's daughter wants to know if patient can have the surgery.

## 2019-07-11 NOTE — Telephone Encounter (Signed)
Please advise, thanks.

## 2019-07-11 NOTE — Telephone Encounter (Signed)
This would be a MAJOR surgery, and the risk of the surgery far outweighs any potential benefits. With patient being in hospice, I definitely recommend continuing with comfort care. If daughter/pt want to discuss further then encourage virtual visit.-thx

## 2019-07-11 NOTE — Telephone Encounter (Signed)
Pts daughter was called and given information, she verbalized understanding.

## 2019-07-12 ENCOUNTER — Other Ambulatory Visit: Payer: Self-pay

## 2019-07-12 ENCOUNTER — Telehealth (INDEPENDENT_AMBULATORY_CARE_PROVIDER_SITE_OTHER): Payer: Medicare Other | Admitting: Neurology

## 2019-07-12 ENCOUNTER — Encounter: Payer: Self-pay | Admitting: Neurology

## 2019-07-12 DIAGNOSIS — F0390 Unspecified dementia without behavioral disturbance: Secondary | ICD-10-CM

## 2019-07-12 DIAGNOSIS — F03B0 Unspecified dementia, moderate, without behavioral disturbance, psychotic disturbance, mood disturbance, and anxiety: Secondary | ICD-10-CM
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
## 2019-07-13 ENCOUNTER — Telehealth: Payer: Self-pay | Admitting: Primary Care

## 2019-07-13 ENCOUNTER — Encounter: Payer: Self-pay | Admitting: Primary Care

## 2019-07-13 ENCOUNTER — Telehealth (INDEPENDENT_AMBULATORY_CARE_PROVIDER_SITE_OTHER): Admitting: Primary Care

## 2019-07-13 DIAGNOSIS — Z515 Encounter for palliative care: Secondary | ICD-10-CM | POA: Diagnosis not present

## 2019-07-13 NOTE — Telephone Encounter (Signed)
Patient recently admitted 1/24-1/27/21 for acute on chronic respiratory failure secondary to aspiration pneumonia and was discharged home with hospice.   He was taken off of all his medications including budesonide and brovana. He is only on prn ativan and morphine.  Family wants to know if they should be continuing nebulizer's. I said it was ok to continue his breathing treatments but would run it by you. Are you ok with me resuming those?>

## 2019-07-13 NOTE — Patient Instructions (Signed)
Continue with hospice care  Ativan as needed for anxiety  Morphine as needed for pain or shortness of breath  You can resume Budesonide and Brovana nebulizer twice daily for comfort   Follow-up: Contact hospice with any questions or needs first  Follow up with Dr. [name] if needed

## 2019-07-13 NOTE — Telephone Encounter (Signed)
Yes that's fine but hospice won't want to pay for them longterm and duoneb qid may work just as well when his supply runs out.   [facility] will pay for them 80% but they would have to come up with the other 20% to get them outside of hospice.

## 2019-07-13 NOTE — Progress Notes (Signed)
Virtual Visit via Video Note  I connected with [name] on 07/13/19 at 11:15 AM EST by a video enabled telemedicine application and verified that I am speaking with the correct person using two identifiers.  Location: Patient: Home Provider: Office   I discussed the limitations of evaluation and management by telemedicine and the availability of in person appointments. The patient expressed understanding and agreed to proceed.  History of Present Illness: 74 year old male, current some day smoker. PMH significant for COPD GOLD III with chronic bronchitis and emphysema, chronic respiratory failure with hypoxia, COVID-19 (December 2020), cardiomyopathy, afib (on anticoagulation), CAD, CHF (EF 35%), HTN, stroke, type 2 diabetes, dementia, bipolar disorder, CKD stage II. Patient of Dr. [name], last seen on 10/05/18. Family called office on 06/27/18, patient no using NIV- per Dr. [name] ok to discontinue needed 2 week follow-up. Patient was admitted from 1/24-1/27/21 for acute on chronic respiratory failure secondary to aspiration pneumonia and was discharged home with hospice. He was taken off of all his medications and put on prn ativan and morphine.  07/13/2019 Patient contacted today for virtual video visit for follow-up/new hospice. Family feels he is doing pretty well, no significant dyspnea or respiratory distress. He is not currently on any breathing treatments. Remains on 4L oxygen saturating 99%. Family states that he becomes easily agitated when not wearing oxygen. He has prn ativan and morphine available but has not had to use. Hospice nurse came yesterday and will be back next week. Family has questions about his medications now that he is on hospice since everything was discontinued.   Observations/Objective:  99% O2 4L No respiratory distress, wheezing or cough  Obvious dementia   Assessment and Plan:  Hospice care patient:  - Recently discharged from hospital with hospice -  Patient decided to come off all medications and does not want to go back to the hospital  - All medications were discontinued, started on prn ativan/oral morphine  - Hospice following patient and managing care/ RN and NA comes to help with bathing   Chronic respiratory failure with hypoxia: - Admitted 1/24-1/27 for acute on chronic respiratory failure secondary to aspiration pneumonia  - Continue 2-4L oxygen for comfort  - PRN morphine for shortness of breath  COPD GOLD III: - Ok to resume Budesonide and Brovana nebulizer twice daily for comfort   Dementia: - Saw neurology yesterday, holding off on medications   Follow Up Instructions:   As needed with Dr. [name]  I discussed the assessment and treatment plan with the patient. The patient was provided an opportunity to ask questions and all were answered. The patient agreed with the plan and demonstrated an understanding of the instructions.   The patient was advised to call back or seek an in-person evaluation if the symptoms worsen or if the condition fails to improve as anticipated.  I provided 22 minutes of non-face-to-face time during this encounter.   Glenford Bayley, NP

## 2019-08-07 DEATH — deceased

## 2020-03-02 IMAGING — DX DG CHEST 1V PORT
1 series · 1 of 1 positions shown · non-contrast
Comparison: December 13, 2017

CLINICAL DATA: Shortness of breath and cough

EXAM:
PORTABLE CHEST 1 VIEW

[chest]
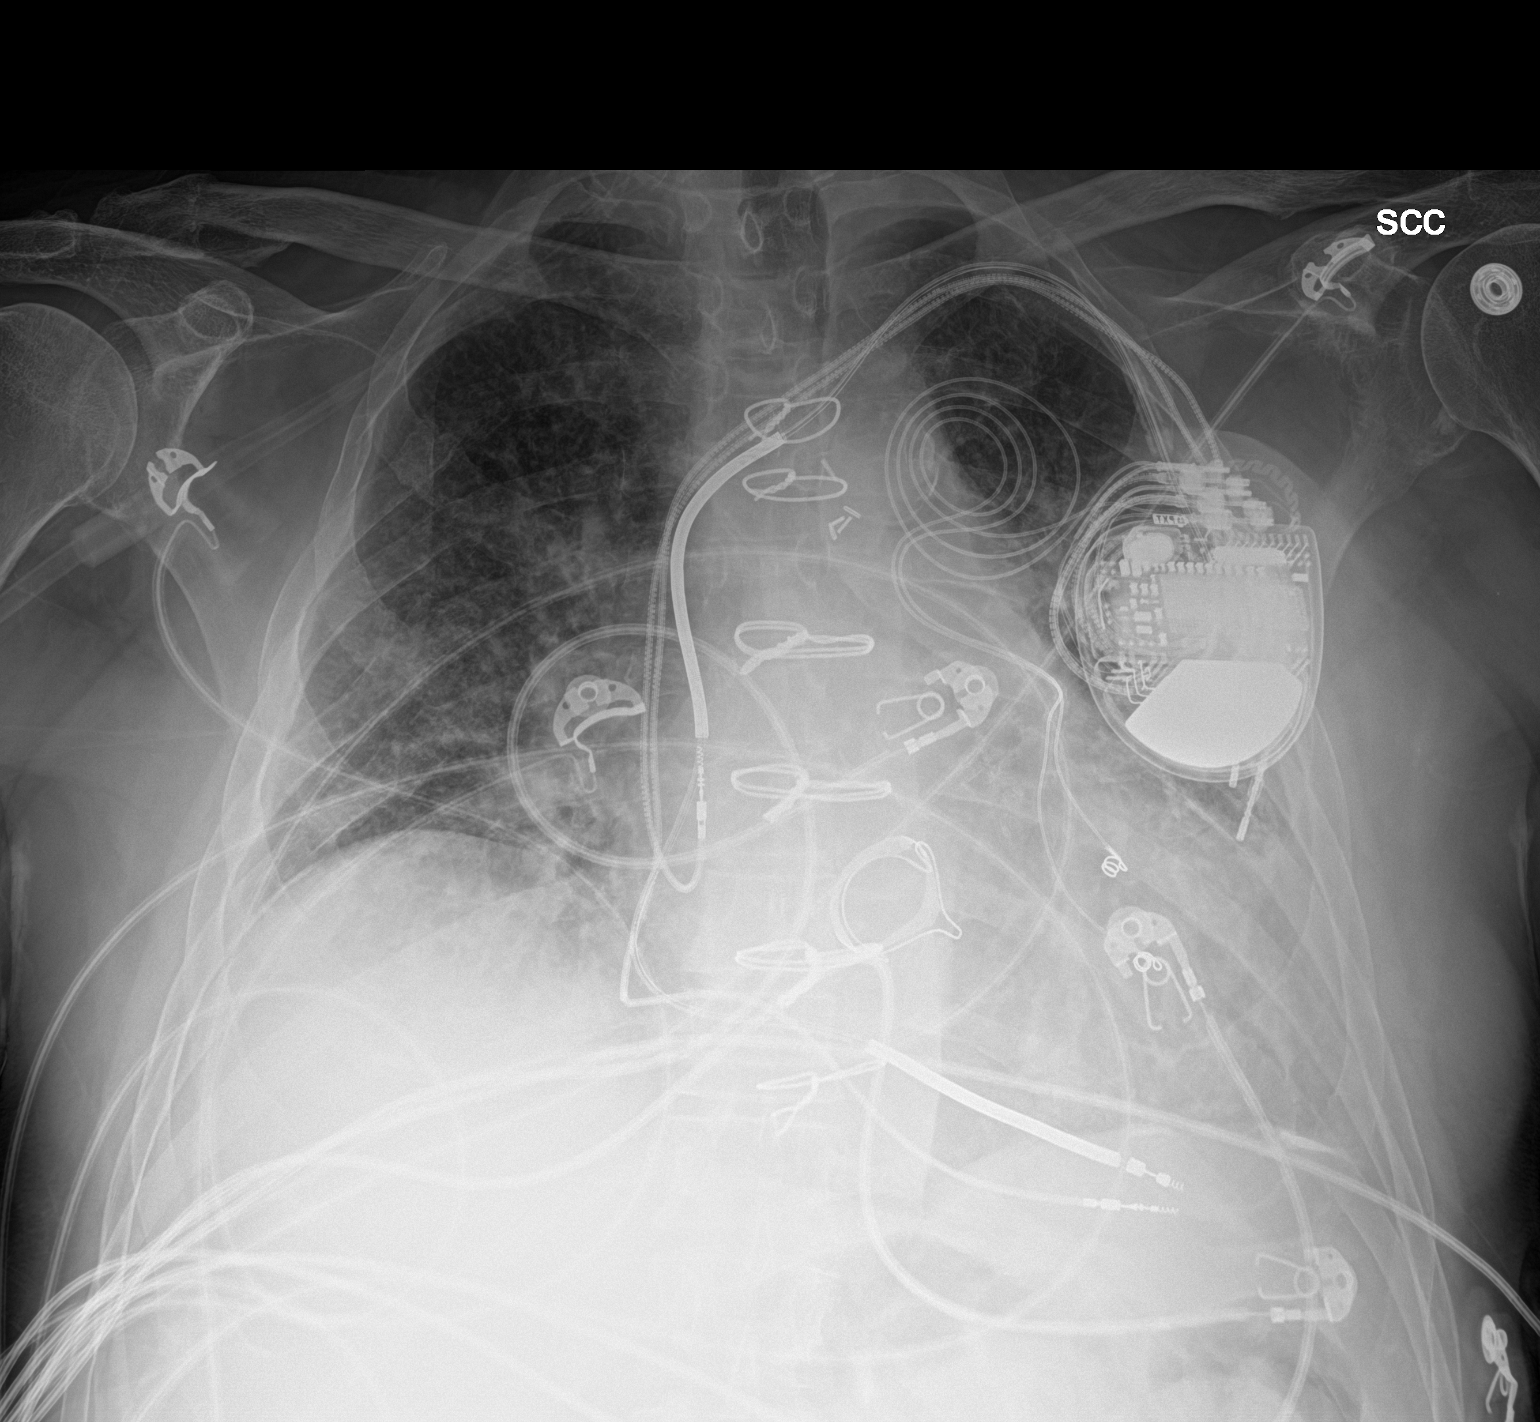

[1 of 1 positions shown; findings below may reference images not displayed]

FINDINGS: There is cardiomegaly with pulmonary venous hypertension. There are
pacemaker leads attached to the right atrium and right ventricle.
Older abandoned leads also present on the left. Patient is status
post mitral valve replacement. There is interstitial edema. No
consolidation or pleural effusions. No adenopathy. No bone lesions
IMPRESSION: Cardiomegaly with pulmonary vascular congestion. There is a degree
of interstitial edema. The overall appearance is felt to be most
consistent with a degree of congestive heart failure. No
consolidation. No adenopathy.

## 2020-03-06 IMAGING — CT CT HEAD W/O CM
3 series · 15 of 47 positions shown, 18 images · non-contrast
Comparison: 10/26/2017

CLINICAL DATA: Ataxia

EXAM:
CT HEAD WITHOUT CONTRAST
TECHNIQUE: Contiguous axial images were obtained from the base of the skull
through the vertex without intravenous contrast.

[Series 3: head 5.0 h30s · axial · 0.42mm/px · z∈[+1289,+1414]mm · 9 of 31 slices shown, 12 images]
[im 3/31  brain]
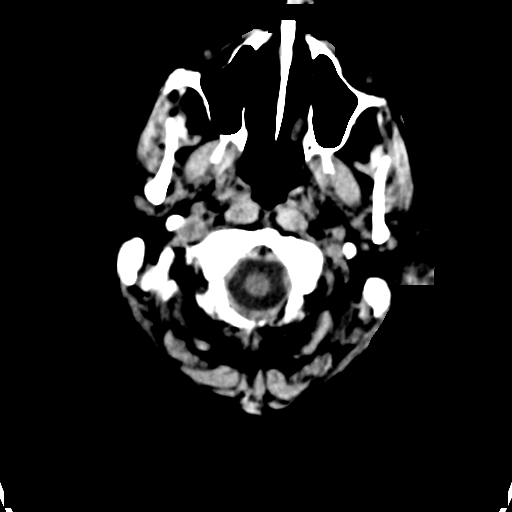
[im 3/31  bone]
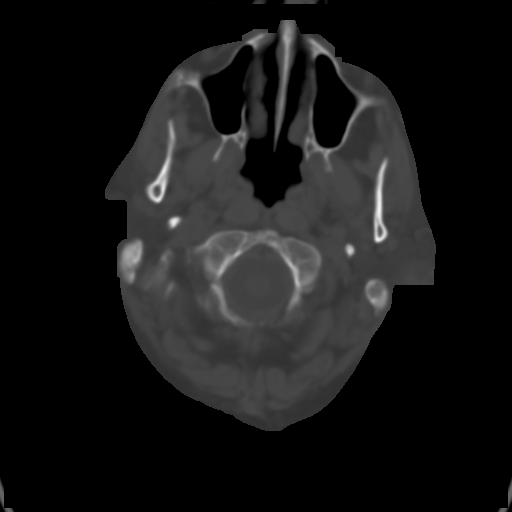
[im 6/31  brain]
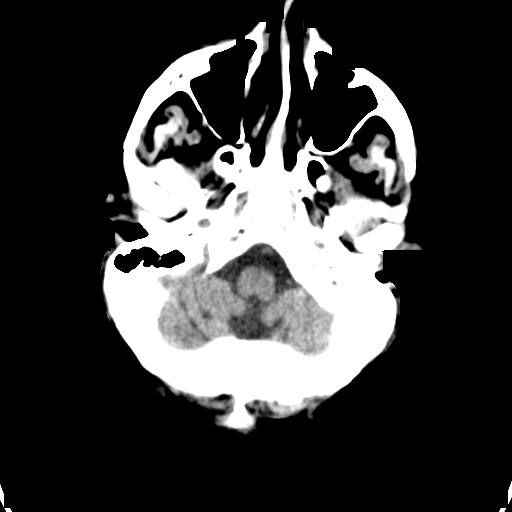
[im 9/31  brain]
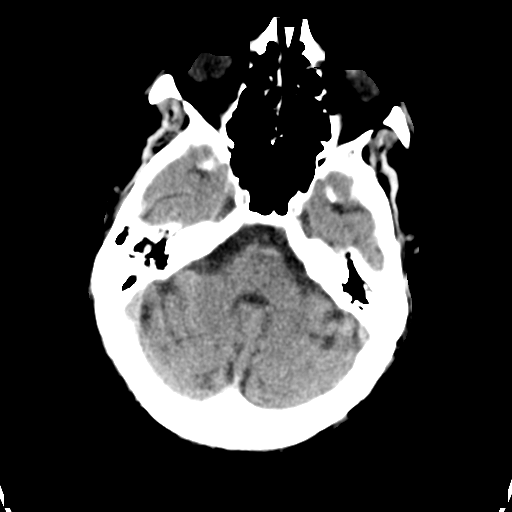
[im 12/31  brain]
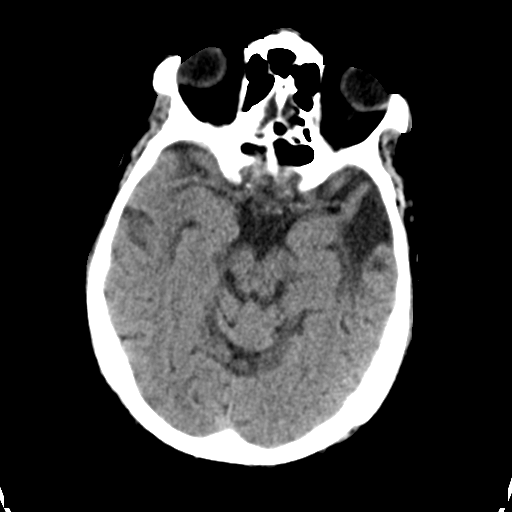
[im 16/31  brain]
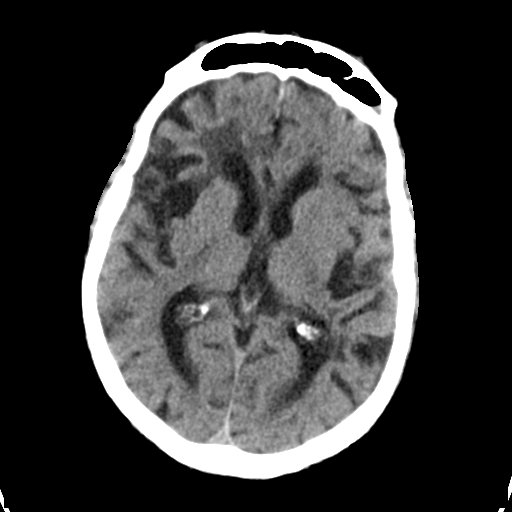
[im 16/31  bone]
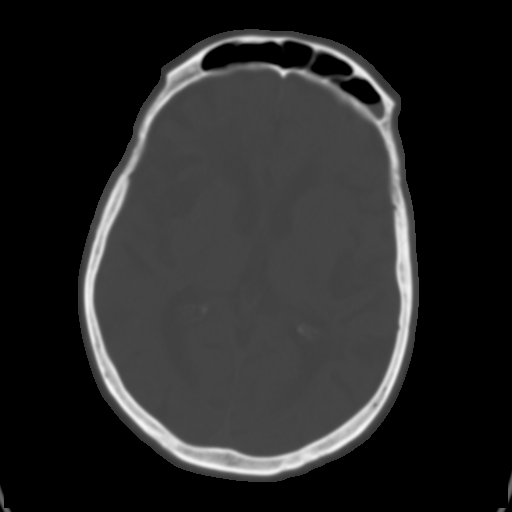
[im 19/31  brain]
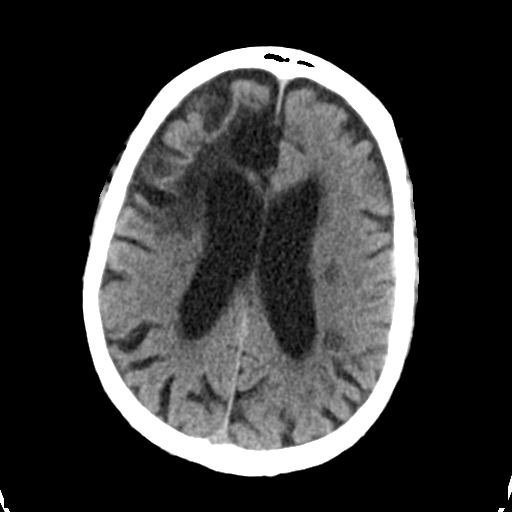
[im 22/31  brain]
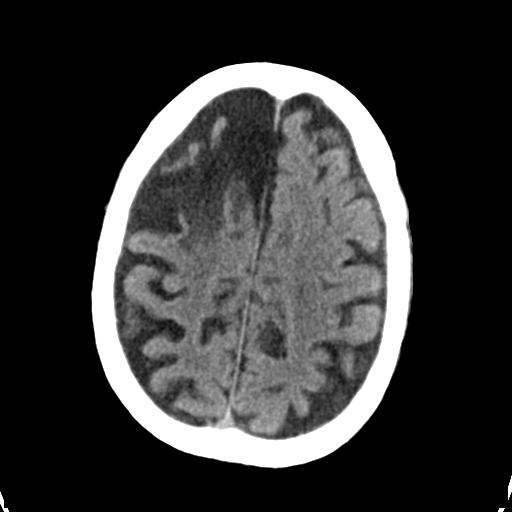
[im 25/31  brain]
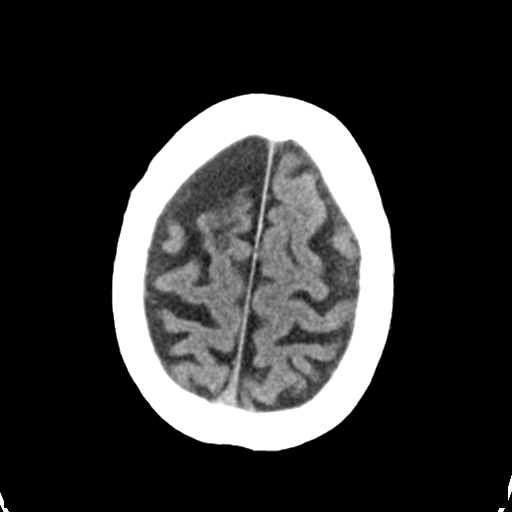
[im 28/31  brain]
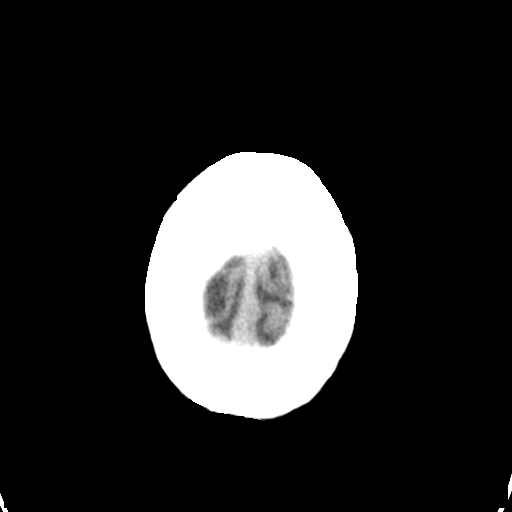
[im 28/31  bone]
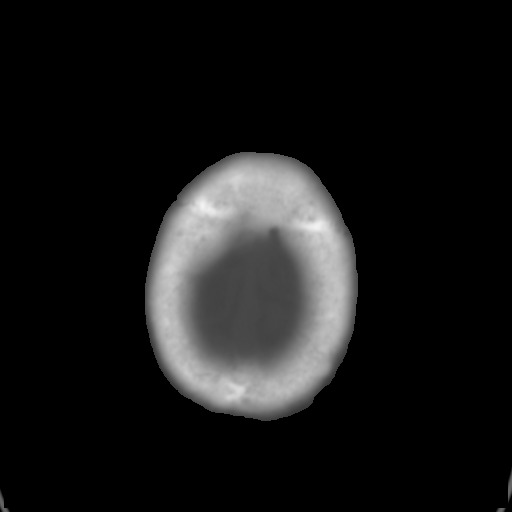

[Series 5: head 3.0 mpr cor · coronal · 0.32mm/px · 3 of 68 slices shown]
[im 23/68  brain]
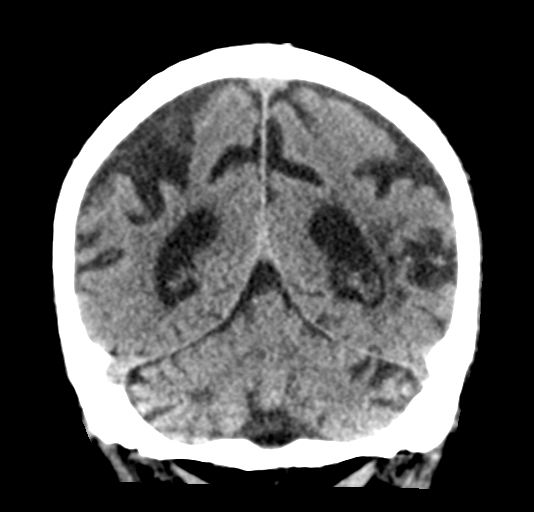
[im 30/68  brain]
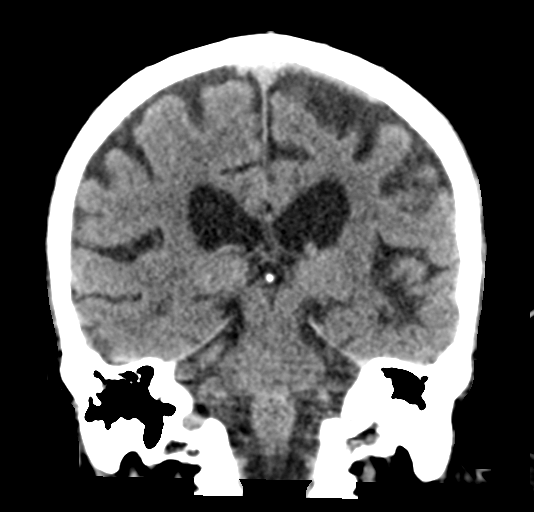
[im 38/68  brain]
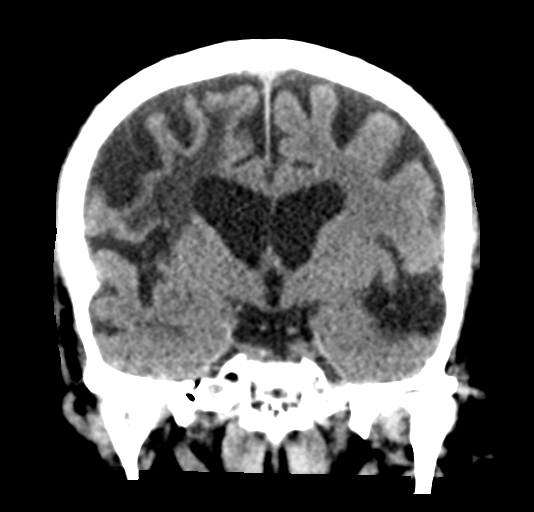

[Series 6: head 3.0 mpr sag · sagittal · 0.30mm/px · 3 of 56 slices shown]
[im 19/56  brain]
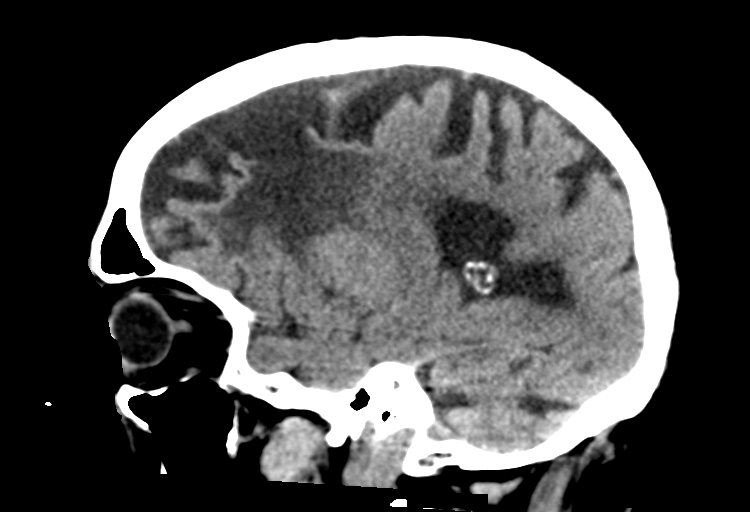
[im 28/56  brain]
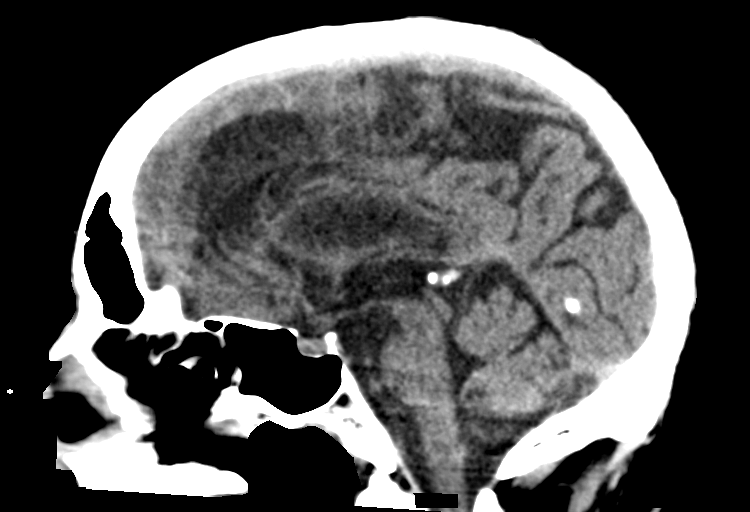
[im 37/56  brain]
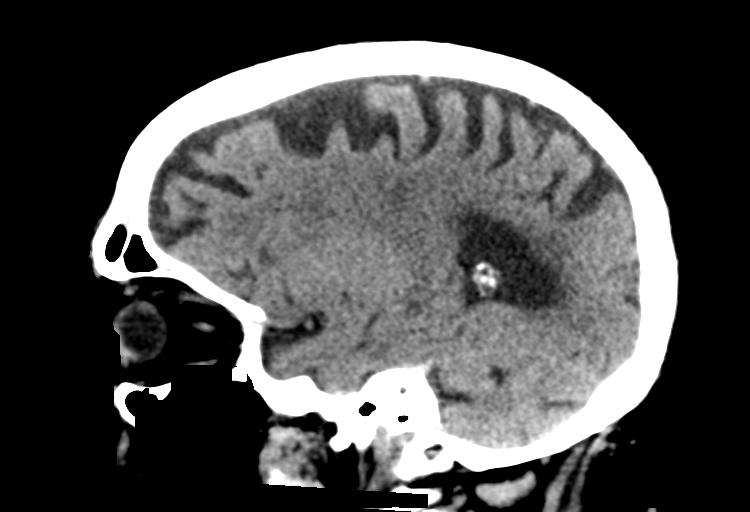

[15 of 47 positions shown; findings below may reference images not displayed]

FINDINGS: Brain: Multiple old infarcts involving the right frontal lobe, left
temporal lobe and parietal lobe and both cerebellar hemispheres,
stable. There is atrophy and chronic small vessel disease changes.
No acute intracranial abnormality. Specifically, no hemorrhage,
hydrocephalus, mass lesion, acute infarction, or significant
intracranial injury.

Vascular: No hyperdense vessel or unexpected calcification.

Skull: No acute calvarial abnormality.

Sinuses/Orbits: Mucosal thickening in the frontal sinuses and
scattered ethmoid air cells. Air-fluid level in the left frontal
sinus.

Other: None
IMPRESSION: Multiple old bilateral infarcts as above.

Atrophy, chronic microvascular disease.

No acute intracranial abnormality.

Acute on chronic sinusitis in the frontal sinuses and ethmoid air
cells.

## 2020-03-06 IMAGING — DX DG CHEST 1V PORT
1 series · 1 of 1 positions shown · non-contrast
Comparison: May 26, 2019

CLINICAL DATA: Shortness of breath

EXAM:
PORTABLE CHEST 1 VIEW

[chest]
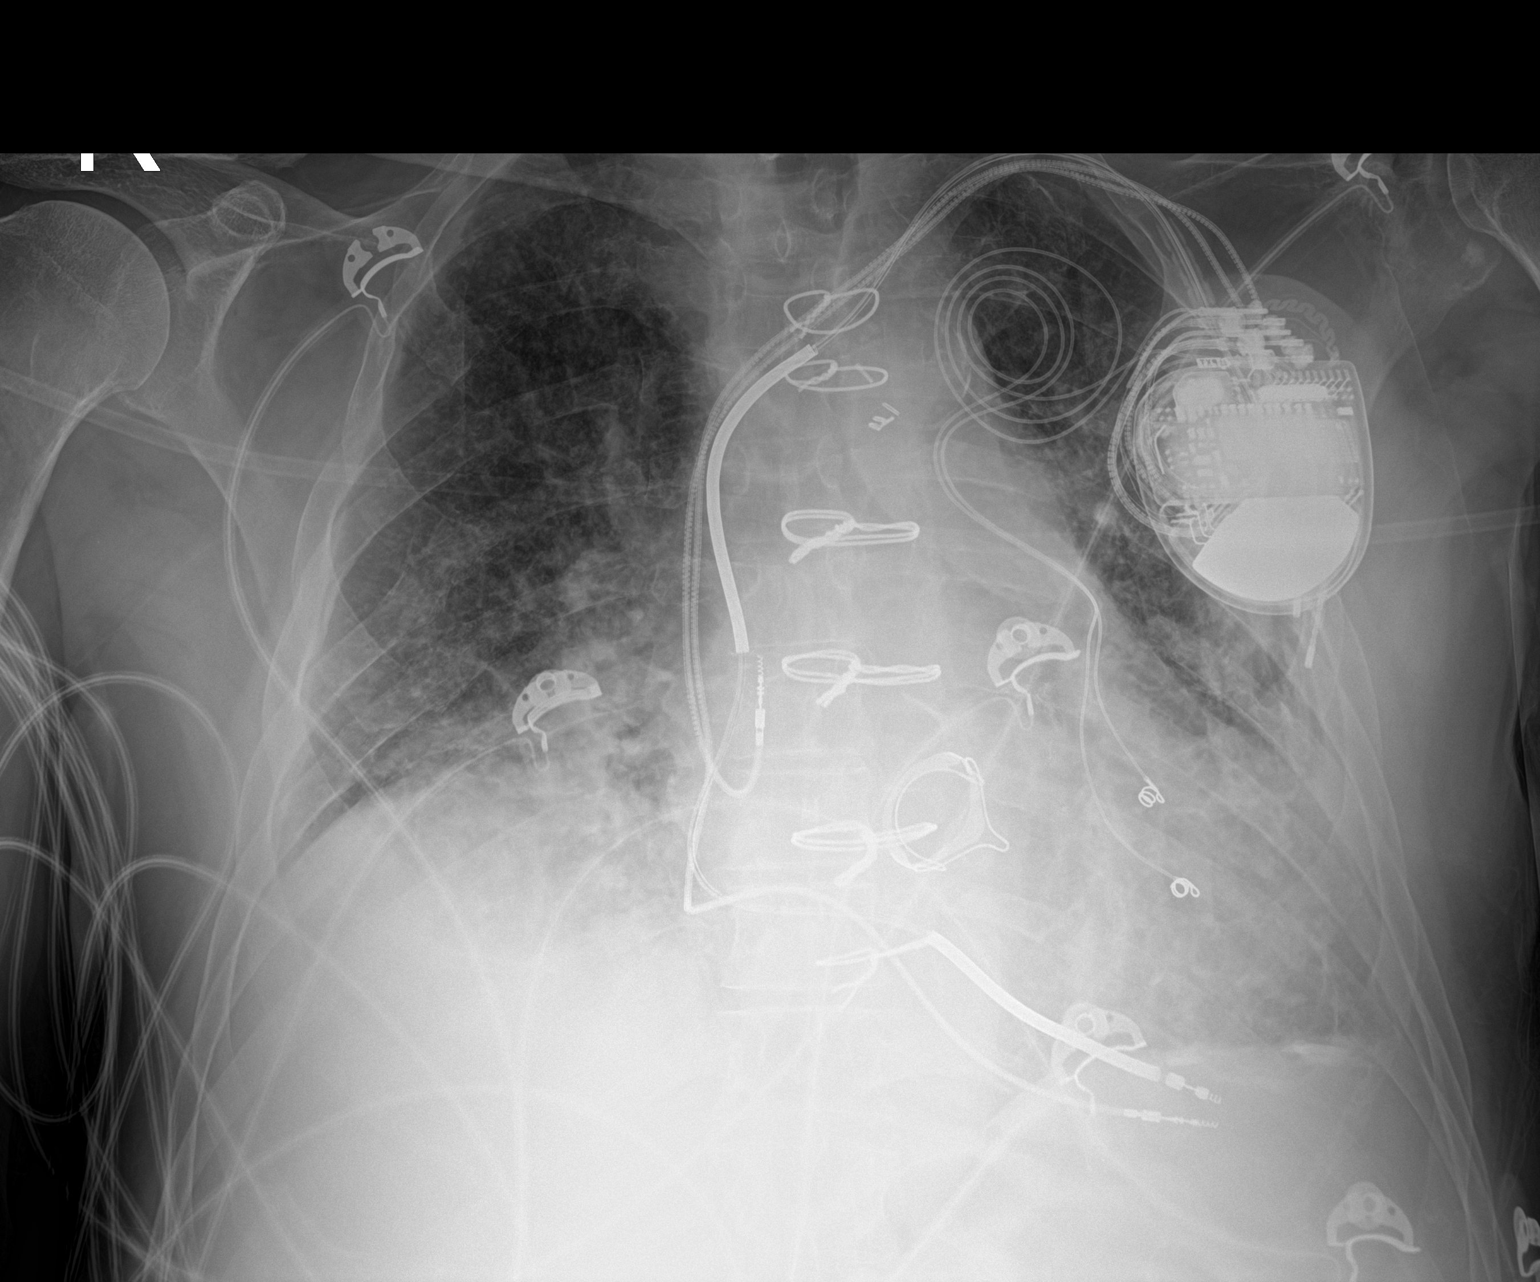

[1 of 1 positions shown; findings below may reference images not displayed]

FINDINGS: There is mild cardiomegaly. A left-sided AICD is seen with the lead
tips in the right atrium and right ventricle. Prosthetic cardiac
valve is seen. Overlying median sternotomy wires. There is interval
slight worsening in the hazy/interstitial airspace opacity at both
lung bases. There is central pulmonary vascular congestion. Pleural
base calcification at the left lung base. Elevation of the right
hemidiaphragm. No acute osseous abnormality.
IMPRESSION: Cardiomegaly

Slight interval worsening in the interstitial/hazy airspace
opacities at both lung bases which could be due to worsening in
interstitial edema and/or early infectious etiology.

## 2020-04-08 IMAGING — DX DG CHEST 1V PORT
1 series · 1 of 1 positions shown · non-contrast
Comparison: Chest x-rays dated 05/30/2019 and 05/26/2019

CLINICAL DATA: Vomiting tonight. Rhonchi in the lower lobes. Cough.

EXAM:
PORTABLE CHEST 1 VIEW

[chest ap]
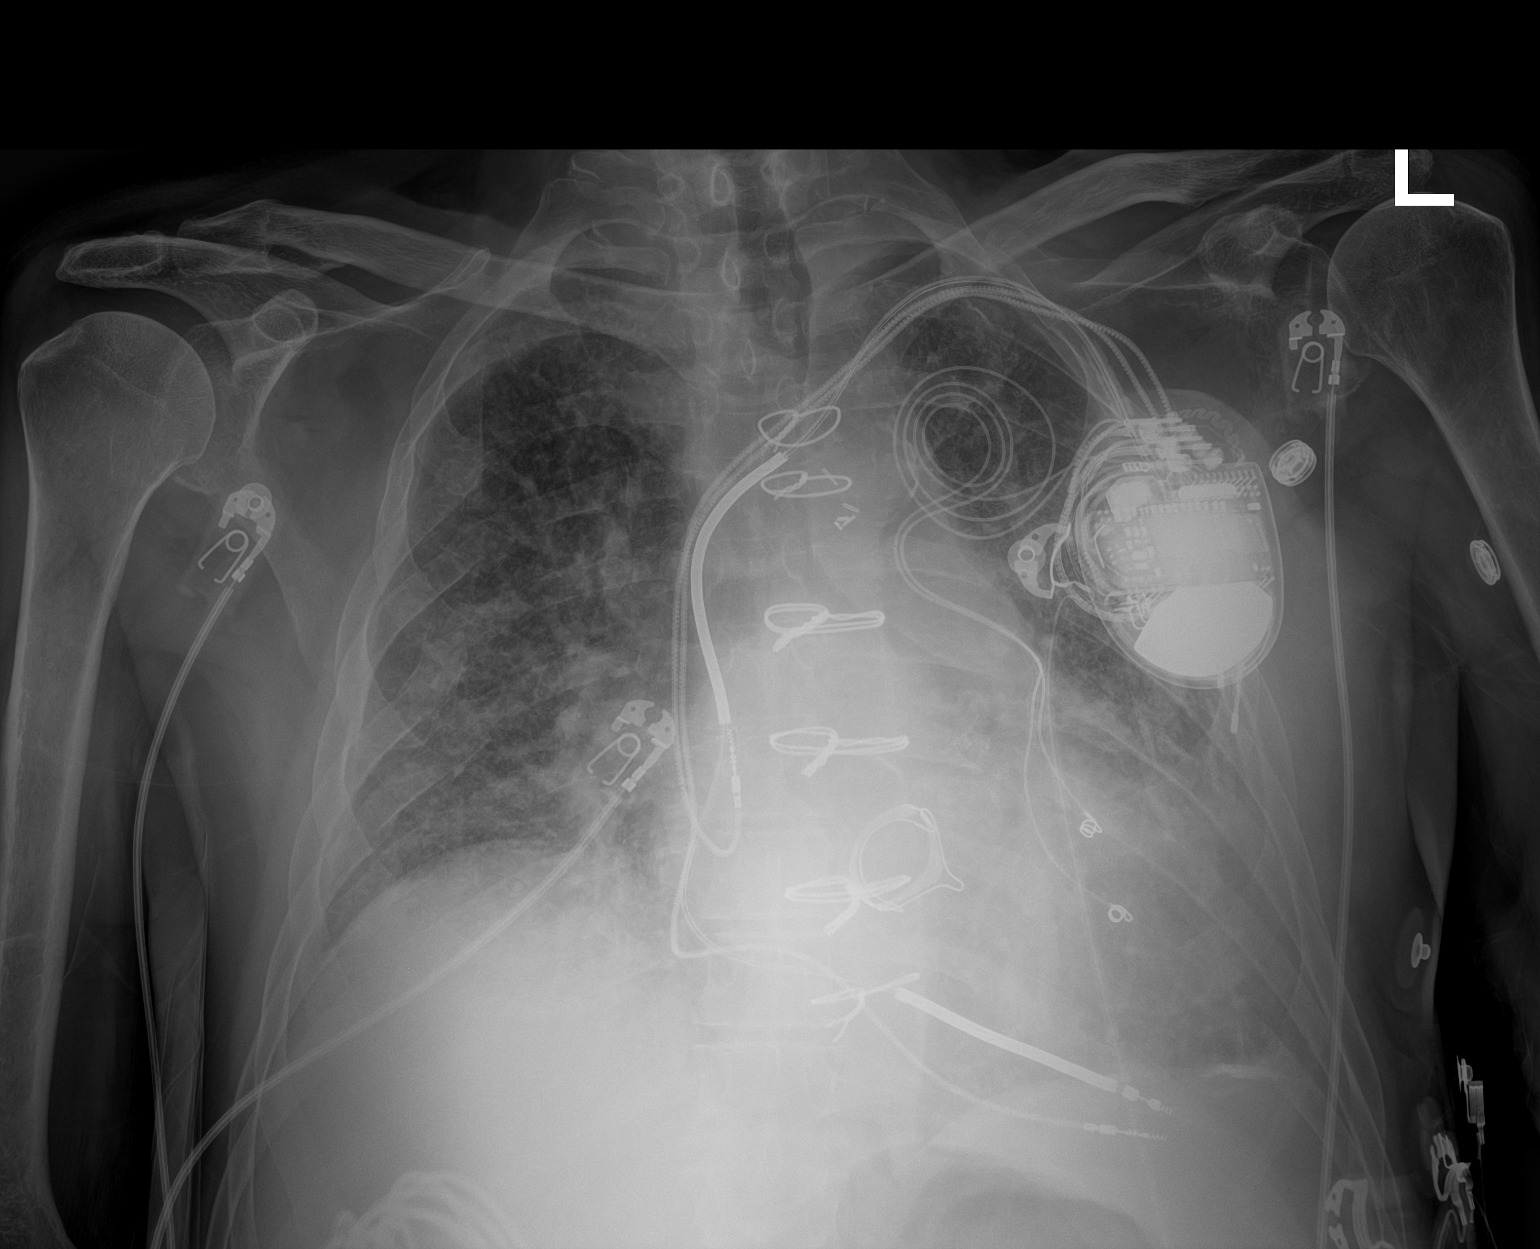

[1 of 1 positions shown; findings below may reference images not displayed]

FINDINGS: Stable cardiomegaly. LEFT chest wall pacemaker apparatus appears
stable.

Again noted are the hazy/interstitial airspace opacities
bilaterally, not significantly changed compared to the most recent
exams, and chronic central pulmonary vascular congestion, suggesting
a chronic CHF. No new lung findings. No pneumothorax seen.

Osseous structures about the chest are unremarkable.
IMPRESSION: Probable chronic CHF. No acute findings. No evidence of superimposed
pneumonia.

## 2020-04-09 IMAGING — CT CT ANGIO CHEST
2 of 7 series · 13 of 46 positions shown · IV contrast (APPLIED)
Comparison: CT abdomen pelvis October 26, 2011,August 23, 2017, chest
radiograph 07/02/2019, CTA chest 02/07/2014

CLINICAL DATA: TMPBQ-BB positive, cough, sudden episode of emesis
while eating

EXAM:
CT ANGIOGRAPHY CHEST
CT ABDOMEN AND PELVIS WITH CONTRAST
TECHNIQUE: Multidetector CT imaging of the chest was performed using the
standard protocol during bolus administration of intravenous
contrast. Multiplanar CT image reconstructions and MIPs were
obtained to evaluate the vascular anatomy. Multidetector CT imaging
of the abdomen and pelvis was performed using the standard protocol
during bolus administration of intravenous contrast.
CONTRAST:  100mL OMNIPAQUE IOHEXOL 350 MG/ML SOLN

[Series 4: thins · axial · 0.69mm/px · z∈[-238,-22]mm · 10 of 349 slices shown]
[im 20/349  lung]
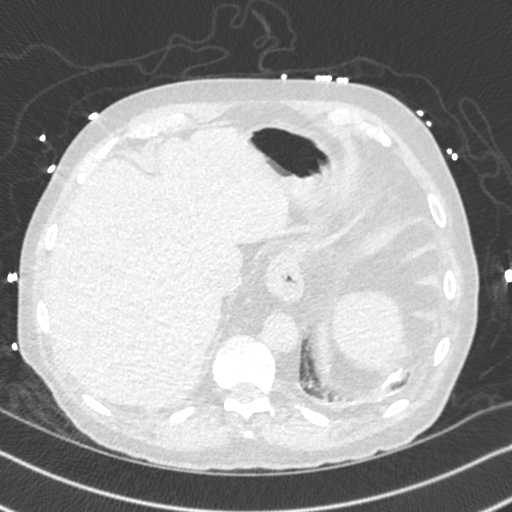
[im 59/349  soft-tissue]
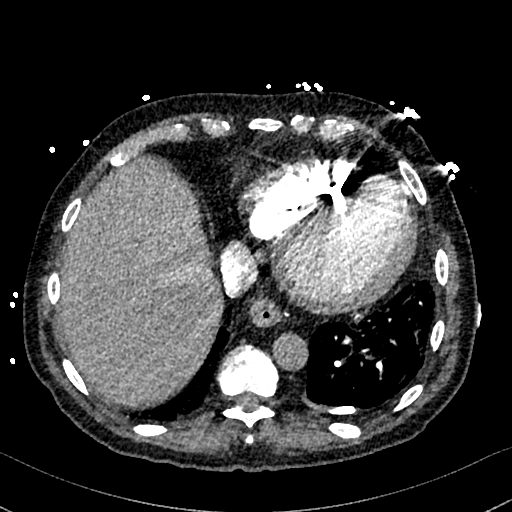
[im 97/349  lung]
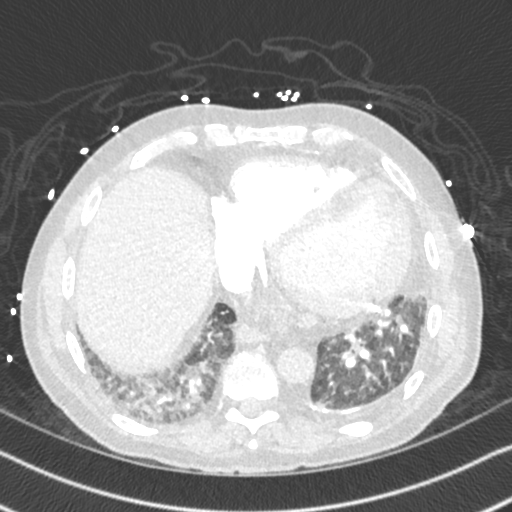
[im 117/349  soft-tissue]
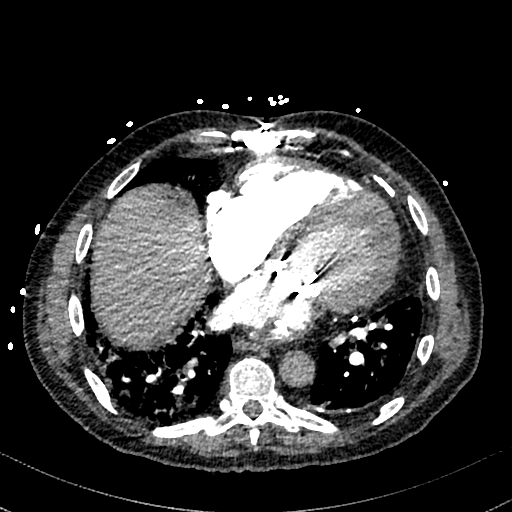
[im 155/349  lung]
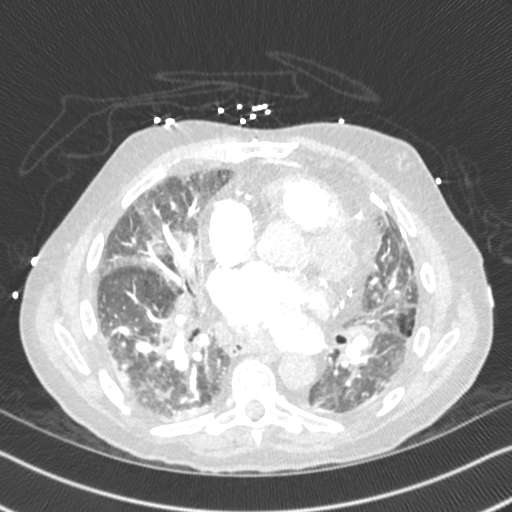
[im 194/349  soft-tissue]
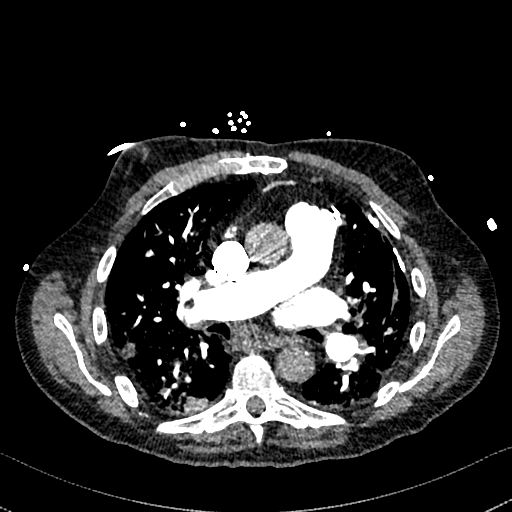
[im 233/349  lung]
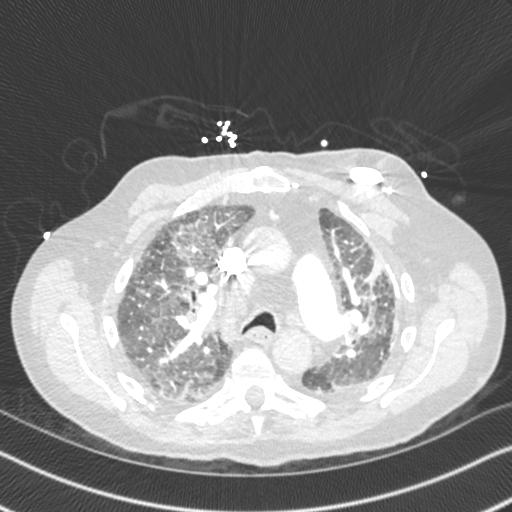
[im 252/349  soft-tissue]
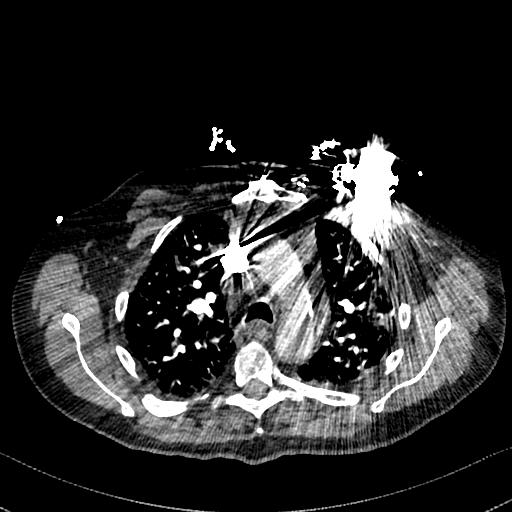
[im 291/349  lung]
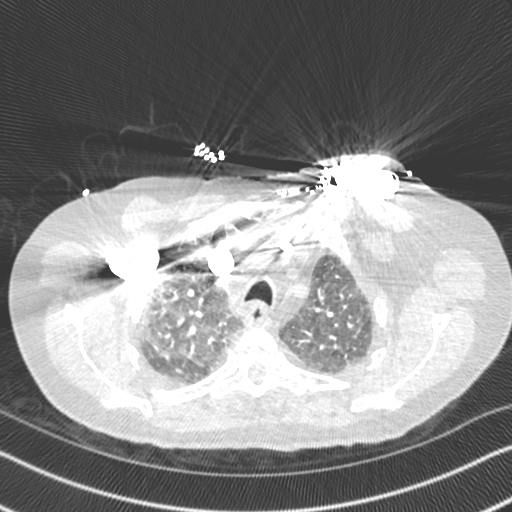
[im 329/349  soft-tissue]
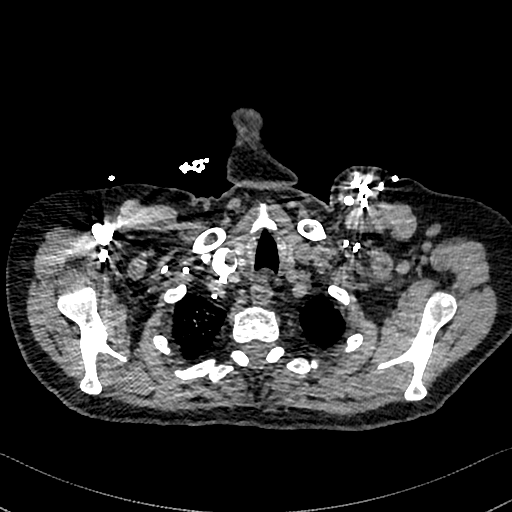

[Series 6: cor · coronal · 0.50mm/px · 3 of 132 slices shown]
[im 33/132  soft-tissue]
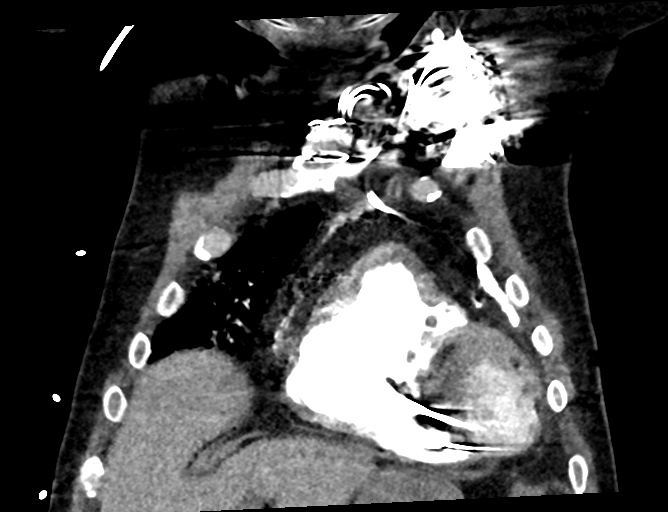
[im 66/132  soft-tissue]
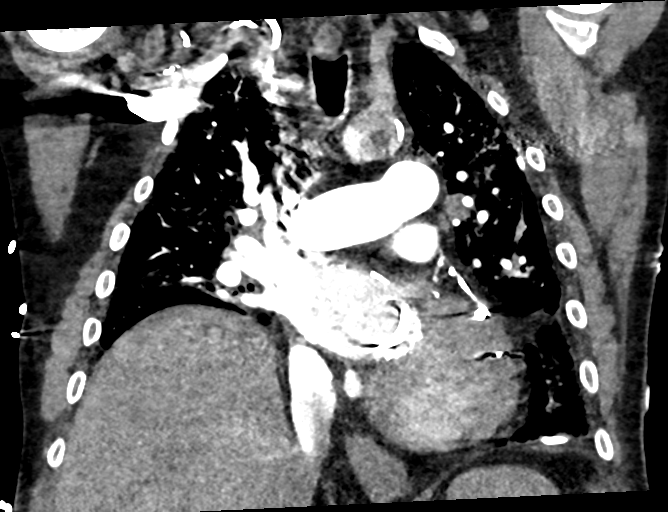
[im 99/132  soft-tissue]
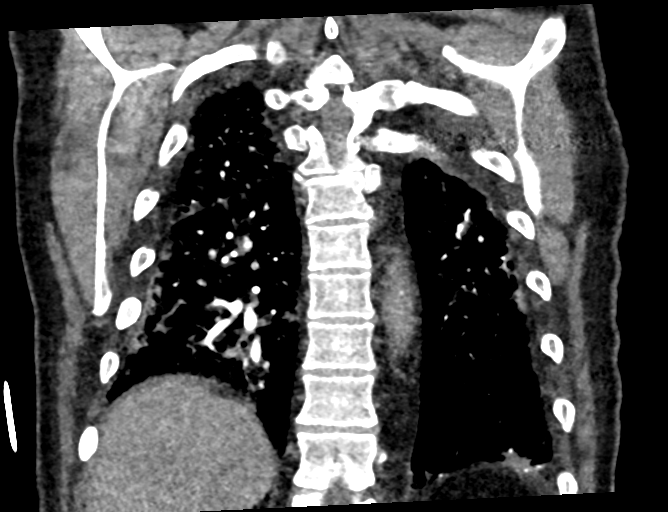

[13 of 46 positions shown; findings below may reference images not displayed]

FINDINGS: CTA CHEST FINDINGS

Cardiovascular: Satisfactory opacification the pulmonary arteries to
the segmental level. No pulmonary artery filling defects are
identified. Central pulmonary arterial enlargement is similar to
comparison exams. There is stable cardiomegaly with predominantly
left cardiac enlargement including left atrial enlargement. There is
a partially calcified filling defect in the left atrium favoring a
thrombus given interval decrease in size since February 2014
comparison CT angiography. There has been slight interval decrease
from the more recent comparison CT abdomen pelvis 08/23/2017 now
measuring 22 x 16 mm, previously 22 x 35 mm at a similar level. A
bioprosthetic mitral valve replacement is noted. Numerous cardiac
pacer/defibrillator leads are present positioned in the right
atrium, right ventricle, and left ventricle. Battery pack overlies
the left chest wall. Evidence of prior CABG with extensive coronary
artery calcifications of the native vessels. Pericardial
calcification may be postsurgical in nature or related to prior
effusion. Additional atherosclerotic plaque within the normal
caliber aorta. Normal 3 vessel branching of the aortic arch.
Proximal great vessels are unremarkable.

Mediastinum/Nodes: Multiple enlarged mediastinal and hilar nodes are
present these include a 10 mm right hilar node (3/68), a 16 mm
subcarinal node (3/64), and a 16 mm right paratracheal lymph node
(3/39). No acute abnormality of the trachea or esophagus. Thoracic
inlet is largely obscured by streak artifact from patient's contrast
bolus and pacer pack no gross abnormalities are seen.

Lungs/Pleura: Diffuse interlobular septal and fissural thickening is
noted as well as extensive peribronchovascular thickening.
Multifocal areas of ground-glass opacity and mixed airspace disease
are present throughout both lungs. More consolidative opacities have
a somewhat peripheral lysed appearance. There are few calcified
pleural plaque seen posteriorly in both lungs. No convincing pleural
effusion. No visible pneumothorax.

Musculoskeletal: Multilevel degenerative changes are present in the
imaged portions of the spine. Post sternotomy changes with intact
sternal sutures. Left chest wall battery pack. Mild bilateral
gynecomastia.

Review of the MIP images confirms the above findings.

CT ABDOMEN and PELVIS FINDINGS

Hepatobiliary: No focal liver lesions. Smooth hepatic surface
contour. Calcified gallstone is present within the normally
distended gallbladder. No pericholecystic fluid or inflammation is
seen. Some irregular thickening at the tip of the gallbladder may
reflect adenomyomatosis and appears similar to comparison studies.
No biliary ductal dilatation.

Pancreas: Unremarkable. No pancreatic ductal dilatation or
surrounding inflammatory changes.

Spleen: Normal in size without focal abnormality.

Adrenals/Urinary Tract: Normal adrenal glands. Few subcentimeter
hypoattenuating foci are present in both kidneys with asymmetric
left renal atrophy and bilateral cortical scarring. No worrisome
renal lesions. No urolithiasis or hydronephrosis is seen. Urinary
bladder is unremarkable.

Stomach/Bowel: Small hiatal hernia. Distal stomach and duodenum are
unremarkable. There is a slightly patulous, air-filled loop of small
bowel in the mid abdomen which appears to demonstrate and
anastomotic line and may reflect prior small bowel to small bowel
anastomosis. There is herniation of the anti mesenteric wall of this
segment through an infraumbilical ventral hernia but without
evidence of mechanical obstruction or vascular compromise. This is
slightly increased in size from comparison exam. A normal appendix
is visualized. No colonic dilatation or wall thickening. Scattered
colonic diverticula without focal pericolonic inflammation to
suggest diverticulitis.

Vascular/Lymphatic: Atherosclerotic plaque within the normal caliber
aorta and branch vessels. No aneurysm or ectasia is seen. No
worrisome abdominopelvic lymph nodes.

Reproductive: The prostate and seminal vesicles are unremarkable.

Other: No abdominopelvic free fluid or free gas. No bowel containing
hernias. Tischlermeister hernia containing portion of the anti mesenteric
small bowel, as detailed above. No other bowel containing hernias.
Bilateral fat containing inguinal hernias.

Musculoskeletal: Multilevel degenerative changes are present in the
imaged portions of the spine. Remote compression deformity of L4 is
similar to prior. No acute or suspicious osseous lesion.

Review of the MIP images confirms the above findings.
IMPRESSION: CTA CHEST

1. No evidence of acute pulmonary artery filling defect to suggest
pulmonary embolus.
2. There is a partially calcified filling defect in the left atrium
favoring a thrombus given interval decrease in size since February [address] comparison [address] abdomen pelvis.
3. Multifocal areas of ground-glass opacity and mixed airspace
disease throughout both lungs. The more consolidative opacities have
a somewhat peripheral appearance. Favor some residual infectious
consolidation or residual opacity from patient's known TMPBQ-BB
diagnosis with superimposed edema/heart failure.
4. Mediastinal and hilar lymphadenopathy is likely reactive.
5. Cardiomegaly with predominantly left atrial ventricular
enlargement and prior bioprosthetic mitral valve replacement.
6. Evidence of prior CABG with extensive coronary artery
calcifications of the native vessels.

CT ABDOMEN AND PELVIS

1. No acute findings in the abdomen or pelvis to explain the
patient's symptoms.
2. Colonic diverticulosis without evidence of diverticulitis.
3. Tischlermeister hernia containing portion of the anti mesenteric small
bowel. No evidence of mechanical obstruction or vascular compromise.
The herniated portion of bowel appears to correspond to a
chronically patulous segment of small bowel with surgical material
likely reflecting prior anastomosis.
4. Bilateral fat containing inguinal hernias.
5. Cholelithiasis without evidence of acute cholecystitis.
Thickening at the tip of the gallbladder may reflect
adenomyomatosis, could consider further evaluation with nonemergent,
outpatient right upper quadrant ultrasound

These results were called by telephone at the time of interpretation
on 07/03/2019 at [DATE] to provider Engr Michael Onikeku PA, who verbally
acknowledged these results.

## 2020-04-09 IMAGING — CT CT ABD-PELV W/ CM
2 of 5 series · 11 of 46 positions shown, 12 images · IV contrast (APPLIED)
Comparison: CT abdomen pelvis October 26, 2011,August 23, 2017, chest
radiograph 07/02/2019, CTA chest 02/07/2014

CLINICAL DATA: TMPBQ-BB positive, cough, sudden episode of emesis
while eating

EXAM:
CT ANGIOGRAPHY CHEST
CT ABDOMEN AND PELVIS WITH CONTRAST
TECHNIQUE: Multidetector CT imaging of the chest was performed using the
standard protocol during bolus administration of intravenous
contrast. Multiplanar CT image reconstructions and MIPs were
obtained to evaluate the vascular anatomy. Multidetector CT imaging
of the abdomen and pelvis was performed using the standard protocol
during bolus administration of intravenous contrast.
CONTRAST:  100mL OMNIPAQUE IOHEXOL 350 MG/ML SOLN

[Series 5: abdomen 5.0 · axial · 0.66mm/px · z∈[-573,-173]mm · 8 of 96 slices shown, 9 images]
[im 8/96  soft-tissue]
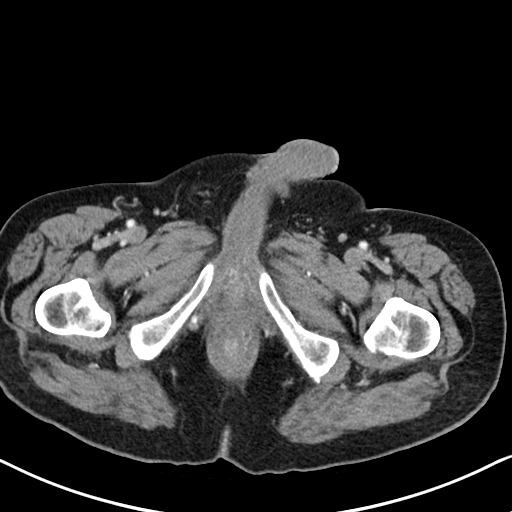
[im 8/96  bone]
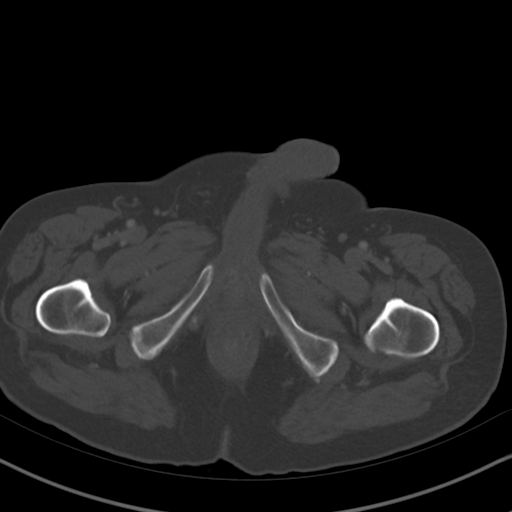
[im 24/96  soft-tissue]
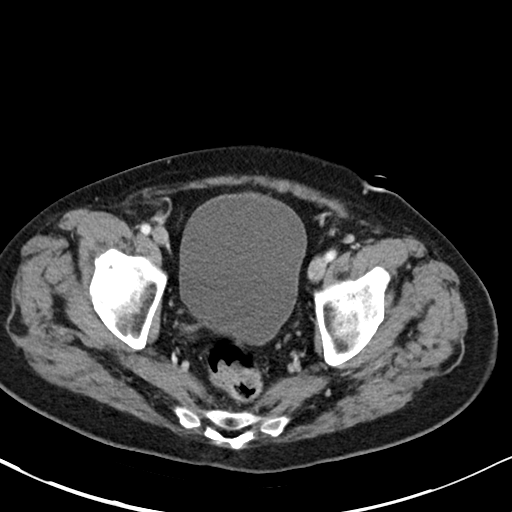
[im 32/96  soft-tissue]
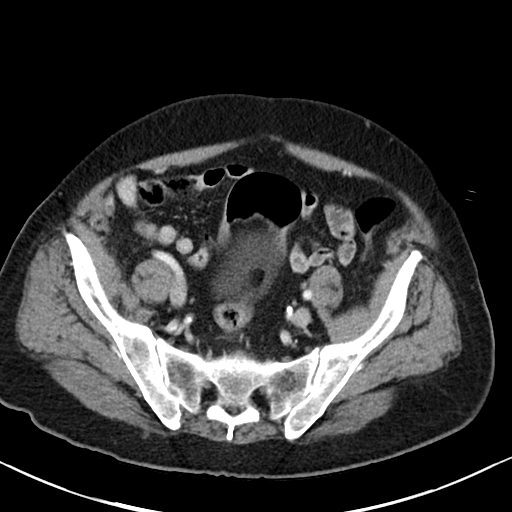
[im 40/96  soft-tissue]
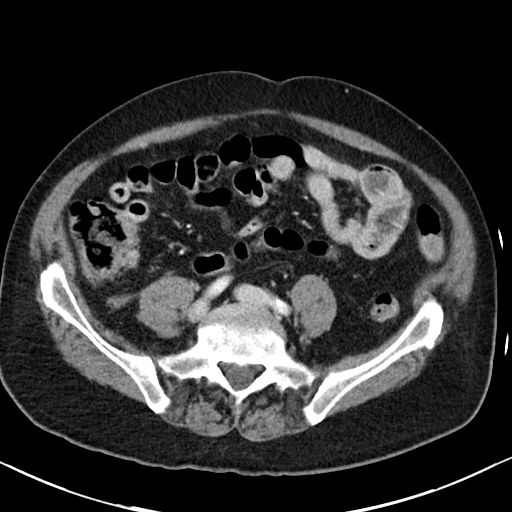
[im 56/96  soft-tissue]
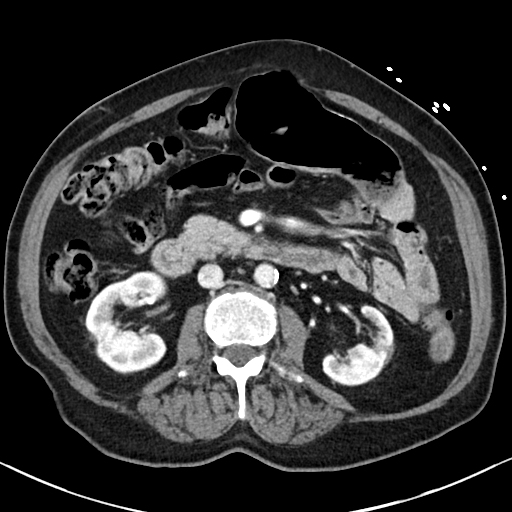
[im 64/96  soft-tissue]
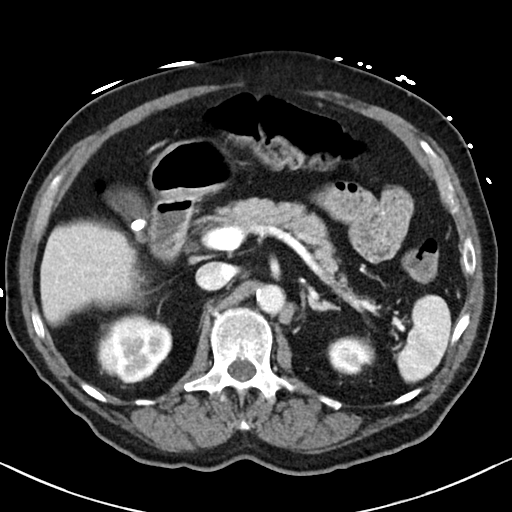
[im 72/96  soft-tissue]
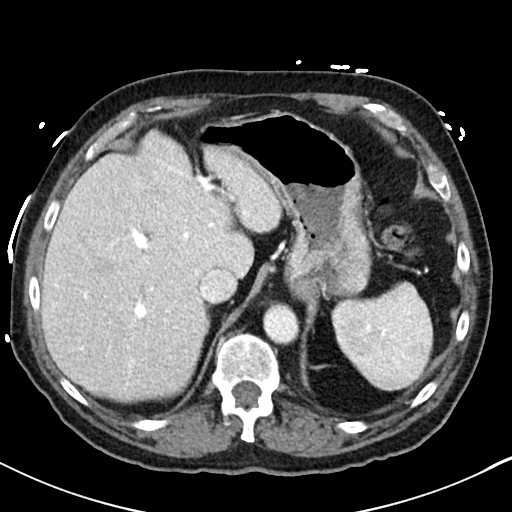
[im 88/96  soft-tissue]
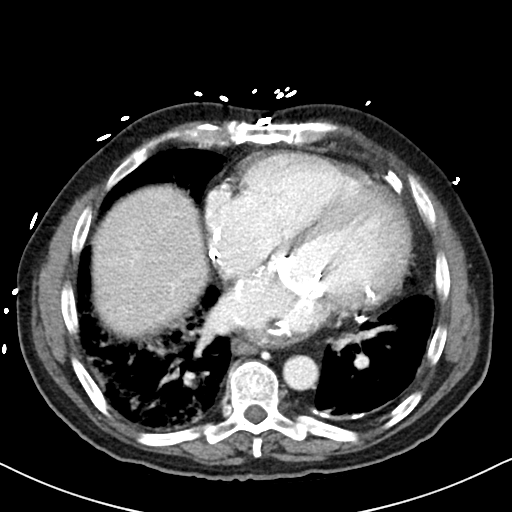

[Series 8: abdomen 3.0 mpr cor · coronal · 0.68mm/px · 3 of 100 slices shown]
[im 34/100  soft-tissue]
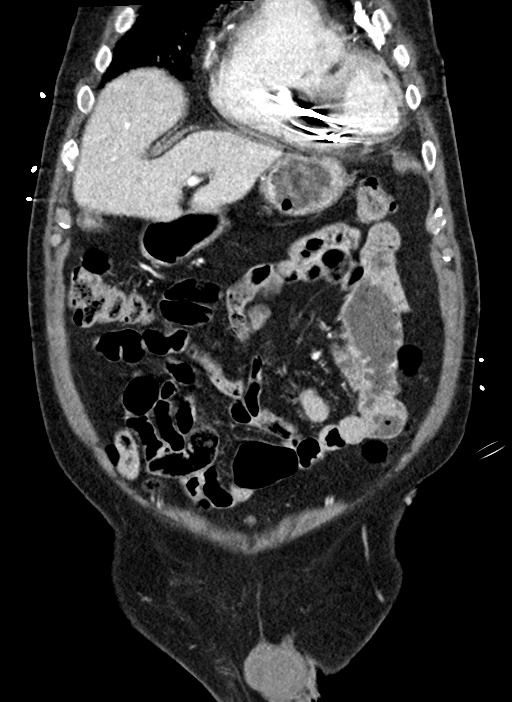
[im 45/100  soft-tissue]
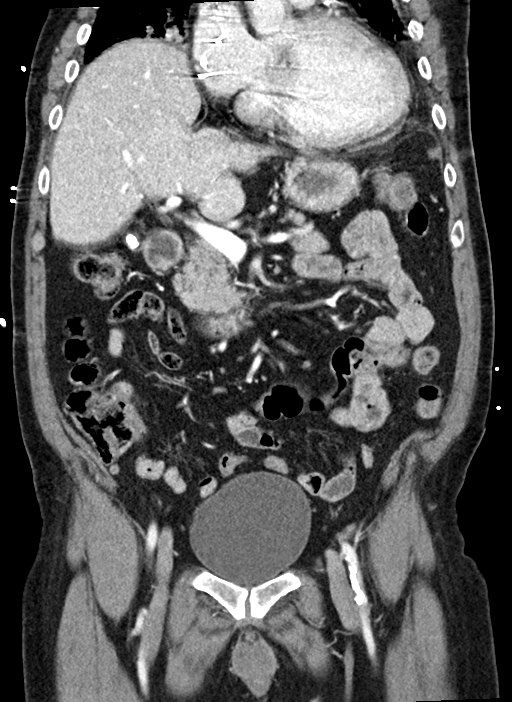
[im 56/100  soft-tissue]
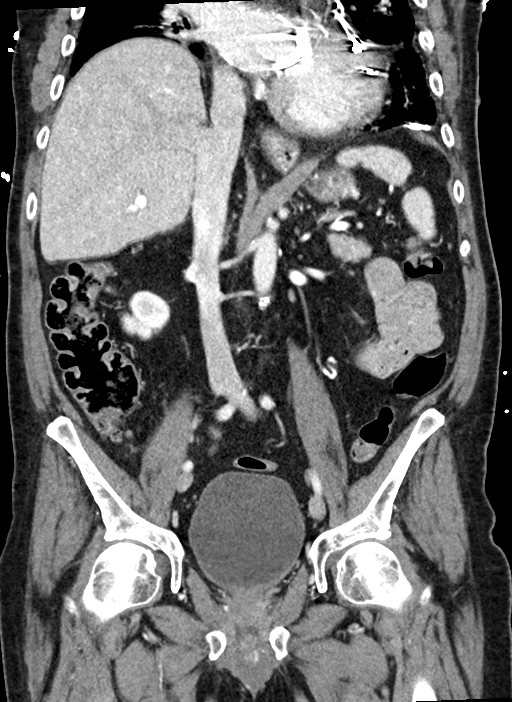

[11 of 46 positions shown; findings below may reference images not displayed]

FINDINGS: CTA CHEST FINDINGS

Cardiovascular: Satisfactory opacification the pulmonary arteries to
the segmental level. No pulmonary artery filling defects are
identified. Central pulmonary arterial enlargement is similar to
comparison exams. There is stable cardiomegaly with predominantly
left cardiac enlargement including left atrial enlargement. There is
a partially calcified filling defect in the left atrium favoring a
thrombus given interval decrease in size since February 2014
comparison CT angiography. There has been slight interval decrease
from the more recent comparison CT abdomen pelvis 08/23/2017 now
measuring 22 x 16 mm, previously 22 x 35 mm at a similar level. A
bioprosthetic mitral valve replacement is noted. Numerous cardiac
pacer/defibrillator leads are present positioned in the right
atrium, right ventricle, and left ventricle. Battery pack overlies
the left chest wall. Evidence of prior CABG with extensive coronary
artery calcifications of the native vessels. Pericardial
calcification may be postsurgical in nature or related to prior
effusion. Additional atherosclerotic plaque within the normal
caliber aorta. Normal 3 vessel branching of the aortic arch.
Proximal great vessels are unremarkable.

Mediastinum/Nodes: Multiple enlarged mediastinal and hilar nodes are
present these include a 10 mm right hilar node (3/68), a 16 mm
subcarinal node (3/64), and a 16 mm right paratracheal lymph node
(3/39). No acute abnormality of the trachea or esophagus. Thoracic
inlet is largely obscured by streak artifact from patient's contrast
bolus and pacer pack no gross abnormalities are seen.

Lungs/Pleura: Diffuse interlobular septal and fissural thickening is
noted as well as extensive peribronchovascular thickening.
Multifocal areas of ground-glass opacity and mixed airspace disease
are present throughout both lungs. More consolidative opacities have
a somewhat peripheral lysed appearance. There are few calcified
pleural plaque seen posteriorly in both lungs. No convincing pleural
effusion. No visible pneumothorax.

Musculoskeletal: Multilevel degenerative changes are present in the
imaged portions of the spine. Post sternotomy changes with intact
sternal sutures. Left chest wall battery pack. Mild bilateral
gynecomastia.

Review of the MIP images confirms the above findings.

CT ABDOMEN and PELVIS FINDINGS

Hepatobiliary: No focal liver lesions. Smooth hepatic surface
contour. Calcified gallstone is present within the normally
distended gallbladder. No pericholecystic fluid or inflammation is
seen. Some irregular thickening at the tip of the gallbladder may
reflect adenomyomatosis and appears similar to comparison studies.
No biliary ductal dilatation.

Pancreas: Unremarkable. No pancreatic ductal dilatation or
surrounding inflammatory changes.

Spleen: Normal in size without focal abnormality.

Adrenals/Urinary Tract: Normal adrenal glands. Few subcentimeter
hypoattenuating foci are present in both kidneys with asymmetric
left renal atrophy and bilateral cortical scarring. No worrisome
renal lesions. No urolithiasis or hydronephrosis is seen. Urinary
bladder is unremarkable.

Stomach/Bowel: Small hiatal hernia. Distal stomach and duodenum are
unremarkable. There is a slightly patulous, air-filled loop of small
bowel in the mid abdomen which appears to demonstrate and
anastomotic line and may reflect prior small bowel to small bowel
anastomosis. There is herniation of the anti mesenteric wall of this
segment through an infraumbilical ventral hernia but without
evidence of mechanical obstruction or vascular compromise. This is
slightly increased in size from comparison exam. A normal appendix
is visualized. No colonic dilatation or wall thickening. Scattered
colonic diverticula without focal pericolonic inflammation to
suggest diverticulitis.

Vascular/Lymphatic: Atherosclerotic plaque within the normal caliber
aorta and branch vessels. No aneurysm or ectasia is seen. No
worrisome abdominopelvic lymph nodes.

Reproductive: The prostate and seminal vesicles are unremarkable.

Other: No abdominopelvic free fluid or free gas. No bowel containing
hernias. Tischlermeister hernia containing portion of the anti mesenteric
small bowel, as detailed above. No other bowel containing hernias.
Bilateral fat containing inguinal hernias.

Musculoskeletal: Multilevel degenerative changes are present in the
imaged portions of the spine. Remote compression deformity of L4 is
similar to prior. No acute or suspicious osseous lesion.

Review of the MIP images confirms the above findings.
IMPRESSION: CTA CHEST

1. No evidence of acute pulmonary artery filling defect to suggest
pulmonary embolus.
2. There is a partially calcified filling defect in the left atrium
favoring a thrombus given interval decrease in size since February [address] comparison [address] abdomen pelvis.
3. Multifocal areas of ground-glass opacity and mixed airspace
disease throughout both lungs. The more consolidative opacities have
a somewhat peripheral appearance. Favor some residual infectious
consolidation or residual opacity from patient's known TMPBQ-BB
diagnosis with superimposed edema/heart failure.
4. Mediastinal and hilar lymphadenopathy is likely reactive.
5. Cardiomegaly with predominantly left atrial ventricular
enlargement and prior bioprosthetic mitral valve replacement.
6. Evidence of prior CABG with extensive coronary artery
calcifications of the native vessels.

CT ABDOMEN AND PELVIS

1. No acute findings in the abdomen or pelvis to explain the
patient's symptoms.
2. Colonic diverticulosis without evidence of diverticulitis.
3. Tischlermeister hernia containing portion of the anti mesenteric small
bowel. No evidence of mechanical obstruction or vascular compromise.
The herniated portion of bowel appears to correspond to a
chronically patulous segment of small bowel with surgical material
likely reflecting prior anastomosis.
4. Bilateral fat containing inguinal hernias.
5. Cholelithiasis without evidence of acute cholecystitis.
Thickening at the tip of the gallbladder may reflect
adenomyomatosis, could consider further evaluation with nonemergent,
outpatient right upper quadrant ultrasound

These results were called by telephone at the time of interpretation
on 07/03/2019 at [DATE] to provider Engr Michael Onikeku PA, who verbally
acknowledged these results.

## 2022-06-03 NOTE — Progress Notes (Signed)
This encounter was created in error - please disregard.
